# Patient Record
Sex: Female | Born: 1955
Health system: Southern US, Community
[De-identification: ages and names within clinical notes are randomized; demographics above are authoritative.]

## PROBLEM LIST (undated history)

## (undated) DIAGNOSIS — T8859XA Other complications of anesthesia, initial encounter: Secondary | ICD-10-CM

## (undated) DIAGNOSIS — H269 Unspecified cataract: Secondary | ICD-10-CM

## (undated) DIAGNOSIS — B019 Varicella without complication: Secondary | ICD-10-CM

## (undated) DIAGNOSIS — T7840XA Allergy, unspecified, initial encounter: Secondary | ICD-10-CM

## (undated) DIAGNOSIS — Z9889 Other specified postprocedural states: Secondary | ICD-10-CM

## (undated) DIAGNOSIS — E7800 Pure hypercholesterolemia, unspecified: Secondary | ICD-10-CM

## (undated) DIAGNOSIS — R32 Unspecified urinary incontinence: Secondary | ICD-10-CM

## (undated) DIAGNOSIS — I639 Cerebral infarction, unspecified: Secondary | ICD-10-CM

## (undated) DIAGNOSIS — M1990 Unspecified osteoarthritis, unspecified site: Secondary | ICD-10-CM

## (undated) DIAGNOSIS — T4145XA Adverse effect of unspecified anesthetic, initial encounter: Secondary | ICD-10-CM

## (undated) DIAGNOSIS — R112 Nausea with vomiting, unspecified: Secondary | ICD-10-CM

## (undated) HISTORY — DX: Unspecified cataract: H26.9

## (undated) HISTORY — PX: WISDOM TOOTH EXTRACTION: SHX21

## (undated) HISTORY — DX: Varicella without complication: B01.9

## (undated) HISTORY — DX: Unspecified urinary incontinence: R32

## (undated) HISTORY — DX: Pure hypercholesterolemia, unspecified: E78.00

## (undated) HISTORY — PX: EYE SURGERY: SHX253

## (undated) HISTORY — DX: Allergy, unspecified, initial encounter: T78.40XA

## (undated) HISTORY — PX: BLADDER SUSPENSION: SHX72

## (undated) HISTORY — PX: JOINT REPLACEMENT: SHX530

---
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
## 2006-02-28 LAB — HM MAMMOGRAPHY: HM Mammogram: NORMAL

## 2007-02-28 HISTORY — PX: HERNIA REPAIR: SHX51

## 2008-03-02 LAB — HM COLONOSCOPY: HM Colonoscopy: NORMAL

## 2012-11-04 ENCOUNTER — Ambulatory Visit: Payer: Self-pay | Admitting: Family

## 2012-11-18 ENCOUNTER — Encounter: Payer: Self-pay | Admitting: Family

## 2012-11-18 ENCOUNTER — Other Ambulatory Visit: Payer: Self-pay | Admitting: Family

## 2012-11-18 ENCOUNTER — Ambulatory Visit (INDEPENDENT_AMBULATORY_CARE_PROVIDER_SITE_OTHER): Payer: 59 | Admitting: Family

## 2012-11-18 VITALS — BP 130/66 | HR 62 | Temp 98.0°F | Ht 66.0 in | Wt 149.2 lb

## 2012-11-18 DIAGNOSIS — Z0000 Encounter for general adult medical examination without abnormal findings: Secondary | ICD-10-CM | POA: Insufficient documentation

## 2012-11-18 DIAGNOSIS — Z136 Encounter for screening for cardiovascular disorders: Secondary | ICD-10-CM

## 2012-11-18 DIAGNOSIS — Z23 Encounter for immunization: Secondary | ICD-10-CM

## 2012-11-18 DIAGNOSIS — Z1231 Encounter for screening mammogram for malignant neoplasm of breast: Secondary | ICD-10-CM

## 2012-11-18 LAB — HEPATIC FUNCTION PANEL
ALT: 20 U/L (ref 0–35)
Albumin: 4.8 g/dL (ref 3.5–5.2)
Alkaline Phosphatase: 41 U/L (ref 39–117)
Bilirubin, Direct: 0.1 mg/dL (ref 0.0–0.3)
Indirect Bilirubin: 0.4 mg/dL (ref 0.0–0.9)
Total Bilirubin: 0.5 mg/dL (ref 0.3–1.2)
Total Protein: 7.5 g/dL (ref 6.0–8.3)

## 2012-11-18 LAB — CBC WITH DIFFERENTIAL/PLATELET
Basophils Relative: 0 % (ref 0–1)
Eosinophils Relative: 2 % (ref 0–5)
HCT: 39.5 % (ref 36.0–46.0)
Lymphocytes Relative: 42 % (ref 12–46)
Lymphs Abs: 2 10*3/uL (ref 0.7–4.0)
MCH: 28.5 pg (ref 26.0–34.0)
MCV: 83.3 fL (ref 78.0–100.0)
Monocytes Absolute: 0.4 10*3/uL (ref 0.1–1.0)
Neutro Abs: 2.4 10*3/uL (ref 1.7–7.7)
Platelets: 249 10*3/uL (ref 150–400)
RBC: 4.74 MIL/uL (ref 3.87–5.11)
WBC: 4.9 10*3/uL (ref 4.0–10.5)

## 2012-11-18 LAB — TSH: TSH: 1.037 u[IU]/mL (ref 0.350–4.500)

## 2012-11-18 LAB — BASIC METABOLIC PANEL WITH GFR
CO2: 31 mEq/L (ref 19–32)
Calcium: 10 mg/dL (ref 8.4–10.5)
Chloride: 99 mEq/L (ref 96–112)
GFR, Est Non African American: >89 mL/min
Sodium: 138 mEq/L (ref 135–145)

## 2012-11-18 LAB — LIPID PANEL
Cholesterol: 251 mg/dL — ABNORMAL HIGH (ref 0–200)
HDL: 57 mg/dL (ref >39)
LDL Cholesterol: 171 mg/dL — ABNORMAL HIGH (ref 0–99)
Total CHOL/HDL Ratio: 4.4 Ratio
Triglycerides: 114 mg/dL (ref <150)
VLDL: 23 mg/dL (ref 0–40)

## 2012-11-18 NOTE — Progress Notes (Signed)
Subjective:    Patient ID: Ariel [name], female    DOB: 1955-03-09, 57 y.o.   MRN: [id number]  HPI  Patient presents today for complete physical.  Immunizations: last tetanus <10 yrs ago.  Had flu shot today Diet:reports healthy diet Exercise: yoga, walking, treadmill, elliptical Colonoscopy: ?4-5 yrs ago.  [facility]- per pt she was told to follow up in 10 years.   Dexa: remote, due Pap Smear: January of this year.  Mammogram: Due      Review of Systems  Constitutional: Negative for unexpected weight change.  HENT: Negative for congestion.   Eyes: Negative for visual disturbance.  Respiratory: Negative for cough and shortness of breath.   Cardiovascular: Negative for chest pain.  Gastrointestinal: Positive for constipation. Negative for nausea and vomiting.  Genitourinary: Negative for dysuria.  Musculoskeletal: Negative for myalgias and arthralgias.  Skin: Negative for rash.  Neurological: Negative for headaches.  Hematological: Negative for adenopathy.  Psychiatric/Behavioral:       Denies depression/anxiety   Past Medical History  Diagnosis Date  . Chicken pox   . High cholesterol   . Urinary incontinence     History   Social History  . Marital Status: Married    Spouse Name: N/A    Number of Children: N/A  . Years of Education: N/A   Occupational History  . Not on file.   Social History Main Topics  . Smoking status: Never Smoker   . Smokeless tobacco: Not on file  . Alcohol Use: No  . Drug Use: Not on file  . Sexual Activity: Not on file   Other Topics Concern  . Not on file   Social History Narrative   Lives with husband   One Son- lives locally, 1 grand-daughter   Daughter- Engineer, water. [place]   Completed technical school   Works as a Social worker.          Past Surgical History  Procedure Laterality Date  . Appendectomy  1962  . Hernia repair  2009  . Bladder suspension      No family history on file.  No Known  Allergies  No current outpatient prescriptions on file prior to visit.   No current facility-administered medications on file prior to visit.    BP 130/66  Pulse 62  Temp(Src) 98 F (36.7 C) (Oral)  Ht 5\' 6"  (1.676 m)  Wt 149 lb 4 oz (67.699 kg)  BMI 24.1 kg/m2  SpO2 98%       Objective:   Physical Exam  Physical Exam  Constitutional: She is oriented to person, place, and time. She appears well-developed and well-nourished. No distress.  HENT:  Head: Normocephalic and atraumatic.  Right Ear: Tympanic membrane and ear canal normal.  Left Ear: Tympanic membrane and ear canal normal.  Mouth/Throat: Oropharynx is clear and moist.  Eyes: Pupils are equal, round, and reactive to light. No scleral icterus.  Neck: Normal range of motion. No thyromegaly present.  Cardiovascular: Normal rate and regular rhythm.   No murmur heard. Pulmonary/Chest: Effort normal and breath sounds normal. No respiratory distress. He has no wheezes. She has no rales. She exhibits no tenderness.  Abdominal: Soft. Bowel sounds are normal. He exhibits no distension and no mass. There is no tenderness. There is no rebound and no guarding.  Musculoskeletal: She exhibits no edema.  Lymphadenopathy:    She has no cervical adenopathy.  Neurological: She is alert and oriented to person, place, and time. She has normal reflexes. She  exhibits normal muscle tone. Coordination normal.  Skin: Skin is warm and dry.  Psychiatric: She has a normal mood and affect. Her behavior is normal. Judgment and thought content normal.  Breast/pelvic: deferred to GYN.        Assessment & Plan:          Assessment & Plan:

## 2012-11-18 NOTE — Assessment & Plan Note (Signed)
Continue healthy diet, exercise. Obtain dexa, mammogram, fasting lab work.  Flu shot today.

## 2012-11-18 NOTE — Patient Instructions (Addendum)
Please complete lab work prior to leaving. Schedule bone density at the front desk. Schedule mammogram on the first floor. Continue healthy diet, exercise. Follow up in 1 year sooner if problems/concerns.

## 2012-11-19 ENCOUNTER — Encounter: Payer: Self-pay | Admitting: Family

## 2012-11-19 DIAGNOSIS — E785 Hyperlipidemia, unspecified: Secondary | ICD-10-CM | POA: Insufficient documentation

## 2012-11-19 LAB — URINALYSIS, ROUTINE W REFLEX MICROSCOPIC
Bilirubin Urine: NEGATIVE
Hgb urine dipstick: NEGATIVE
Ketones, ur: NEGATIVE mg/dL
Leukocytes, UA: NEGATIVE
Nitrite: NEGATIVE
Protein, ur: NEGATIVE mg/dL
Urobilinogen, UA: 0.2 mg/dL (ref 0.0–1.0)
pH: 6.5 (ref 5.0–8.0)

## 2012-11-20 ENCOUNTER — Telehealth: Payer: Self-pay | Admitting: *Deleted

## 2012-11-20 ENCOUNTER — Ambulatory Visit (HOSPITAL_BASED_OUTPATIENT_CLINIC_OR_DEPARTMENT_OTHER)
Admission: RE | Admit: 2012-11-20 | Discharge: 2012-11-20 | Disposition: A | Discharge status: Home / Self Care | Payer: 59 | Source: Ambulatory Visit | Attending: Family | Admitting: Family

## 2012-11-20 DIAGNOSIS — E785 Hyperlipidemia, unspecified: Secondary | ICD-10-CM

## 2012-11-20 DIAGNOSIS — Z1231 Encounter for screening mammogram for malignant neoplasm of breast: Secondary | ICD-10-CM

## 2012-11-20 NOTE — Telephone Encounter (Signed)
Message copied by Kathi Simpers on Wed Nov 20, 2012  5:19 PM ------      Message from: O'SULLIVAN, MELISSA      Created: Tue Nov 19, 2012  5:13 PM       Please contact pt and let her know cholesterol is elevated.  I would recommend that she focus on low fat/low cholesterol diet. Repeat FLP in 6 months dx hyperlipidemia.        Kidney function, sugar, liver, urine, thyroid and blood count all look good. ------

## 2012-11-20 NOTE — Telephone Encounter (Signed)
Notified pt and she voice understanding. Lab order entered. 

## 2012-11-27 ENCOUNTER — Ambulatory Visit: Payer: Self-pay | Admitting: Family Medicine

## 2012-12-09 ENCOUNTER — Ambulatory Visit (INDEPENDENT_AMBULATORY_CARE_PROVIDER_SITE_OTHER)
Admission: RE | Admit: 2012-12-09 | Discharge: 2012-12-09 | Disposition: A | Discharge status: Home / Self Care | Payer: 59 | Source: Ambulatory Visit | Attending: Family | Admitting: Family

## 2012-12-09 DIAGNOSIS — Z0000 Encounter for general adult medical examination without abnormal findings: Secondary | ICD-10-CM

## 2012-12-16 ENCOUNTER — Encounter: Payer: Self-pay | Admitting: Family

## 2013-11-19 ENCOUNTER — Encounter: Payer: Self-pay | Admitting: Family

## 2013-11-19 ENCOUNTER — Ambulatory Visit (INDEPENDENT_AMBULATORY_CARE_PROVIDER_SITE_OTHER): Payer: 59 | Admitting: Family

## 2013-11-19 ENCOUNTER — Telehealth: Payer: Self-pay

## 2013-11-19 VITALS — BP 111/61 | HR 63 | Temp 97.8°F | Resp 14 | Ht 66.0 in | Wt 145.5 lb

## 2013-11-19 DIAGNOSIS — Z23 Encounter for immunization: Secondary | ICD-10-CM

## 2013-11-19 DIAGNOSIS — E0590 Thyrotoxicosis, unspecified without thyrotoxic crisis or storm: Secondary | ICD-10-CM

## 2013-11-19 DIAGNOSIS — E785 Hyperlipidemia, unspecified: Secondary | ICD-10-CM

## 2013-11-19 DIAGNOSIS — Z0000 Encounter for general adult medical examination without abnormal findings: Secondary | ICD-10-CM

## 2013-11-19 DIAGNOSIS — R32 Unspecified urinary incontinence: Secondary | ICD-10-CM | POA: Insufficient documentation

## 2013-11-19 LAB — URINALYSIS, ROUTINE W REFLEX MICROSCOPIC
Bilirubin Urine: NEGATIVE
HGB URINE DIPSTICK: NEGATIVE
Ketones, ur: NEGATIVE
Leukocytes, UA: NEGATIVE
NITRITE: NEGATIVE
RBC / HPF: NONE SEEN (ref 0–?)
Specific Gravity, Urine: 1.01 (ref 1.000–1.030)
TOTAL PROTEIN, URINE-UPE24: NEGATIVE
Urine Glucose: NEGATIVE
Urobilinogen, UA: 0.2 (ref 0.0–1.0)
WBC, UA: NONE SEEN (ref 0–?)
pH: 8 (ref 5.0–8.0)

## 2013-11-19 LAB — LIPID PANEL
CHOL/HDL RATIO: 4
Cholesterol: 192 mg/dL (ref 0–200)
HDL: 44.2 mg/dL (ref >39.00)
LDL Cholesterol: 130 mg/dL — ABNORMAL HIGH (ref 0–99)
NonHDL: 147.8
TRIGLYCERIDES: 91 mg/dL (ref 0.0–149.0)
VLDL: 18.2 mg/dL (ref 0.0–40.0)

## 2013-11-19 LAB — CBC WITH DIFFERENTIAL/PLATELET
BASOS ABS: 0 10*3/uL (ref 0.0–0.1)
Basophils Relative: 0.4 % (ref 0.0–3.0)
EOS ABS: 0.1 10*3/uL (ref 0.0–0.7)
Eosinophils Relative: 1.3 % (ref 0.0–5.0)
HCT: 41.5 % (ref 36.0–46.0)
HEMOGLOBIN: 14 g/dL (ref 12.0–15.0)
LYMPHS PCT: 40.9 % (ref 12.0–46.0)
Lymphs Abs: 1.9 10*3/uL (ref 0.7–4.0)
MCHC: 33.7 g/dL (ref 30.0–36.0)
MCV: 86.2 fl (ref 78.0–100.0)
MONOS PCT: 7.1 % (ref 3.0–12.0)
Monocytes Absolute: 0.3 10*3/uL (ref 0.1–1.0)
NEUTROS ABS: 2.3 10*3/uL (ref 1.4–7.7)
NEUTROS PCT: 50.3 % (ref 43.0–77.0)
Platelets: 228 10*3/uL (ref 150.0–400.0)
RBC: 4.81 Mil/uL (ref 3.87–5.11)
RDW: 12.4 % (ref 11.5–15.5)
WBC: 4.6 10*3/uL (ref 4.0–10.5)

## 2013-11-19 LAB — BASIC METABOLIC PANEL
BUN: 12 mg/dL (ref 6–23)
CHLORIDE: 101 meq/L (ref 96–112)
CO2: 32 meq/L (ref 19–32)
Calcium: 9.6 mg/dL (ref 8.4–10.5)
Creatinine, Ser: 0.7 mg/dL (ref 0.4–1.2)
GFR: 97.23 mL/min (ref >60.00)
Glucose, Bld: 93 mg/dL (ref 70–99)
POTASSIUM: 4 meq/L (ref 3.5–5.1)
Sodium: 138 mEq/L (ref 135–145)

## 2013-11-19 LAB — HEPATIC FUNCTION PANEL
ALK PHOS: 45 U/L (ref 39–117)
ALT: 25 U/L (ref 0–35)
AST: 28 U/L (ref 0–37)
Albumin: 4.5 g/dL (ref 3.5–5.2)
BILIRUBIN DIRECT: 0 mg/dL (ref 0.0–0.3)
Total Bilirubin: 0.4 mg/dL (ref 0.2–1.2)
Total Protein: 7.9 g/dL (ref 6.0–8.3)

## 2013-11-19 LAB — TSH: TSH: 0.17 u[IU]/mL — ABNORMAL LOW (ref 0.35–4.50)

## 2013-11-19 NOTE — Assessment & Plan Note (Signed)
Will refer to urology at pt request. Check urinalysis.

## 2013-11-19 NOTE — Patient Instructions (Signed)
Please complete lab work prior to leaving. You will be contacted about your referral to Dr. Alinda Money. Please let me know if you have not heard back in 1 week about this referral. Schedule mammogram on the first floor. Follow up in 1 year, sooner if problems/concerns.

## 2013-11-19 NOTE — Progress Notes (Signed)
Subjective:    Patient ID: Ariel [name], female    DOB: 01-Dec-1955, 58 y.o.   MRN: [id number]  HPI  [name] is a 58 yr old female who presents today for cpx.    Immunizations: flu shot today, reports Tdap is due Diet: reports diet is healthy Exercise: elliptical 3x a week Colonoscopy: colonoscopy was performed 2010- ? Polyps- pt told 10 year follow up.  Dexa: 2013 Pap Smear:1/14 Mammogram: due  Urinary frequency/urgency- hx of bladder tack procedure.  Has declined rx therapy due to possible side effects. Would consider another bladder procedure if indicated. Would like to see Dr. Alinda Money at [facility] urology.   Hyperlipidemia-  Reports that she had shoulder pain on statin x 2 weeks.  Declines statin.    Hypotension- reports that she had one episode of hypotension 94/46, ? Dry that day.  No further low blood pressures.    BP Readings from Last 3 Encounters:  11/19/13 111/61  11/18/12 130/66       Review of Systems  Constitutional: Negative for unexpected weight change.  HENT: Negative for rhinorrhea.   Respiratory: Negative for cough and shortness of breath.   Cardiovascular: Negative for chest pain and leg swelling.  Gastrointestinal: Positive for constipation. Negative for nausea, vomiting and diarrhea.       Reports chronic constipation- takes "natural supplement" lots of fiber  Genitourinary: Positive for frequency. Negative for dysuria.  Musculoskeletal: Negative for arthralgias.  Skin: Negative for rash.       Hair loss, dry skin  Neurological: Negative for headaches.  Hematological: Negative for adenopathy.  Psychiatric/Behavioral:       Denies depression/anxiety   Past Medical History  Diagnosis Date  . Chicken pox   . High cholesterol   . Urinary incontinence     History   Social History  . Marital Status: Married    Spouse Name: N/A    Number of Children: N/A  . Years of Education: N/A   Occupational History  . Not on file.   Social History  Main Topics  . Smoking status: Never Smoker   . Smokeless tobacco: Not on file  . Alcohol Use: No  . Drug Use: Not on file  . Sexual Activity: Not on file   Other Topics Concern  . Not on file   Social History Narrative   Lives with husband   One Son- lives locally, 1 grand-daughter   Daughter- Sport and exercise psychologist. [place]   Completed technical school   Works as a Probation officer.          Past Surgical History  Procedure Laterality Date  . Appendectomy  1962  . Hernia repair  2009  . Bladder suspension      Family History  Problem Relation Age of Onset  . Hyperlipidemia      died 47- "natural causes"  . Coronary artery disease Father     died at 59  . COPD Father   . Alcohol abuse Father   . Hyperlipidemia      4 siblings   . Hypertension      3 siblings  . Diabetes Mellitus II Father     Allergies  Allergen Reactions  . Codeine Nausea Only    No current outpatient prescriptions on file prior to visit.   No current facility-administered medications on file prior to visit.    BP 111/61  Pulse 63  Temp(Src) 97.8 F (36.6 C) (Oral)  Resp 14  Ht 5\' 6"  (1.676 m)  Wt 145 lb 8 oz (65.998 kg)  BMI 23.50 kg/m2  SpO2 100%       Objective:   Physical Exam Physical Exam  Constitutional: She is oriented to person, place, and time. She appears well-developed and well-nourished. No distress.  HENT:  Head: Normocephalic and atraumatic.  Right Ear: Tympanic membrane and ear canal normal.  Left Ear: Tympanic membrane and ear canal normal.  Mouth/Throat: Oropharynx is clear and moist.  Eyes: Pupils are equal, round, and reactive to light. No scleral icterus.  Neck: Normal range of motion. No thyromegaly present.  Cardiovascular: Normal rate and regular rhythm.   No murmur heard. Pulmonary/Chest: Effort normal and breath sounds normal. No respiratory distress. He has no wheezes. She has no rales. She exhibits no tenderness.  Abdominal: Soft. Bowel sounds are normal. He  exhibits no distension and no mass. There is no tenderness. There is no rebound and no guarding.  Musculoskeletal: She exhibits no edema.  Lymphadenopathy:    She has no cervical adenopathy.  Neurological: She is alert and oriented to person, place, and time. She has normal patellar reflexes. She exhibits normal muscle tone. Coordination normal.  Skin: Skin is warm and dry.  Psychiatric: She has a normal mood and affect. Her behavior is normal. Judgment and thought content normal.  Breasts: Examined lying Right: Without masses, retractions, discharge or axillary adenopathy.  Left: Without masses, retractions, discharge or axillary adenopathy.            Assessment & Plan:          Assessment & Plan:

## 2013-11-19 NOTE — Assessment & Plan Note (Addendum)
Lab Results  Component Value Date   CHOL 192 11/19/2013   HDL 44.20 11/19/2013   LDLCALC 130* 11/19/2013   TRIG 91.0 11/19/2013   CHOLHDL 4 11/19/2013   Lipids are significantly improved. Continue dietary changes.

## 2013-11-19 NOTE — Assessment & Plan Note (Signed)
Continue healthy diet, exercise.  Obtain fasting labs, refer for mammogram.  EKG today.

## 2013-11-19 NOTE — Telephone Encounter (Signed)
Carrie Usery Self [phone]  Jaquelynn return a call

## 2013-11-19 NOTE — Progress Notes (Signed)
Pre visit review using our clinic review tool, if applicable. No additional management support is needed unless otherwise documented below in the visit note/SLS  

## 2013-11-20 NOTE — Telephone Encounter (Signed)
Please let pt know that I reviewed her lab work and it is showing that her thyroid is overactive.  I would like for her to complete a thyroid ultrasound for further evaluation.  Also, could you please ask lab to add on free t3, free t4 dx hyperthyroid? Cholesterol is much better- was 251 now down to 192.  Keep up the good work with low cholesterol diet and exercise.

## 2013-11-21 ENCOUNTER — Ambulatory Visit: Payer: 59

## 2013-11-21 DIAGNOSIS — E0590 Thyrotoxicosis, unspecified without thyrotoxic crisis or storm: Secondary | ICD-10-CM

## 2013-11-21 LAB — T4, FREE: Free T4: 0.92 ng/dL (ref 0.60–1.60)

## 2013-11-21 LAB — T3, FREE: T3 FREE: 3.3 pg/mL (ref 2.3–4.2)

## 2013-11-21 NOTE — Telephone Encounter (Signed)
Add on form faxed to lab. Detailed message left on cell voicemail and to call if any questions. Are you going to place u/s order?

## 2013-11-21 NOTE — Telephone Encounter (Signed)
US order has been placed.

## 2013-11-24 ENCOUNTER — Telehealth: Payer: Self-pay | Admitting: Family

## 2013-11-24 ENCOUNTER — Ambulatory Visit (HOSPITAL_BASED_OUTPATIENT_CLINIC_OR_DEPARTMENT_OTHER)
Admission: RE | Admit: 2013-11-24 | Discharge: 2013-11-24 | Disposition: A | Discharge status: Home / Self Care | Payer: 59 | Source: Ambulatory Visit | Attending: Interventional Radiology | Admitting: Interventional Radiology

## 2013-11-24 DIAGNOSIS — E042 Nontoxic multinodular goiter: Secondary | ICD-10-CM | POA: Insufficient documentation

## 2013-11-24 DIAGNOSIS — E0590 Thyrotoxicosis, unspecified without thyrotoxic crisis or storm: Secondary | ICD-10-CM | POA: Insufficient documentation

## 2013-11-24 IMAGING — US US SOFT TISSUE HEAD/NECK
1 series · 14 of 25 positions shown · non-contrast
Comparison: None.

CLINICAL DATA: 58 yr old female with hyperthyroid. Please evaluate.

EXAM:
THYROID ULTRASOUND
TECHNIQUE: Ultrasound examination of the thyroid gland and adjacent soft
tissues was performed.

[Series 1: us soft tissue head/neck · 0.05mm/px · 14 of 49 slices shown]
[im 1/49]
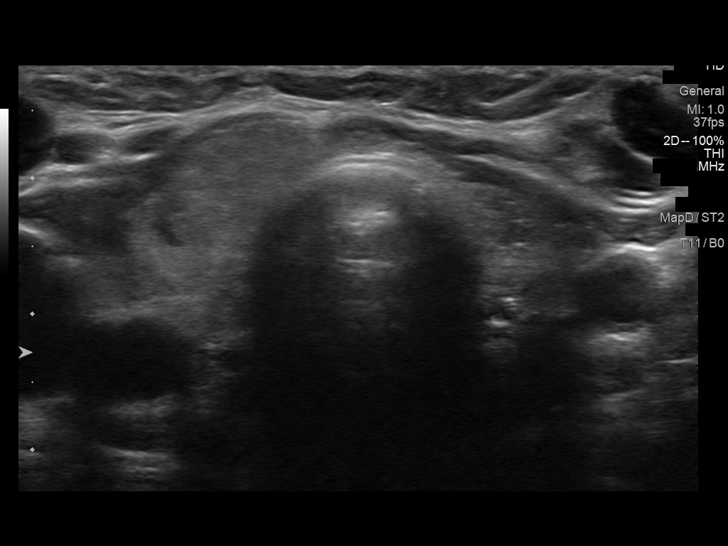
[im 5/49]
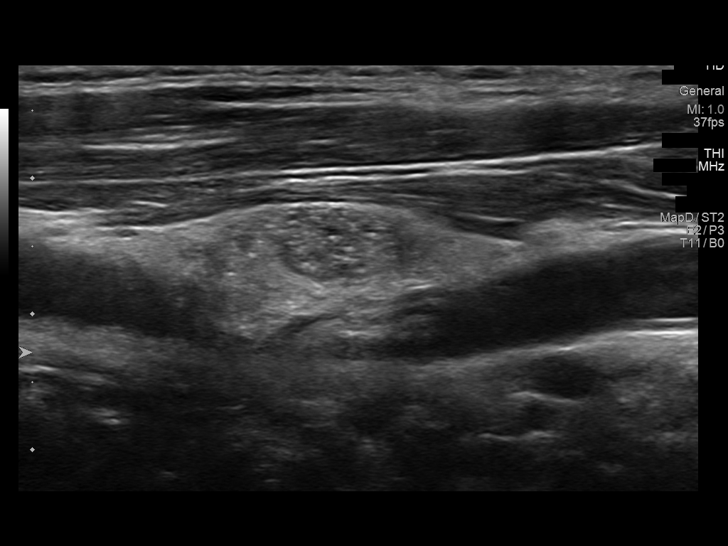
[im 9/49]
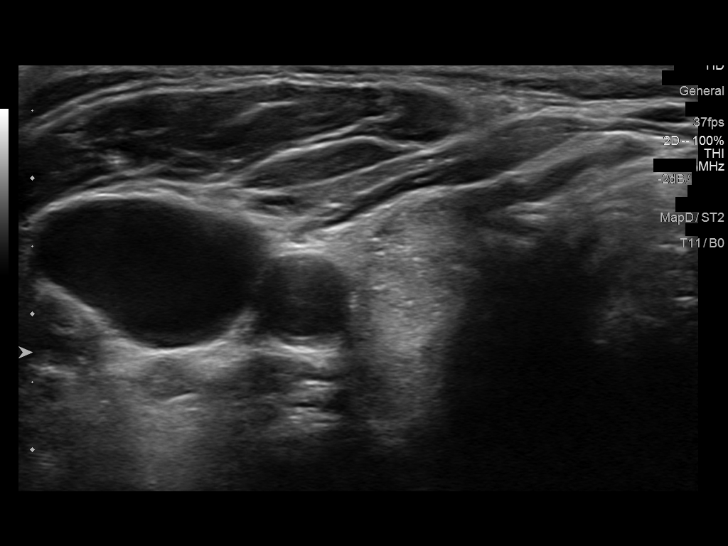
[im 13/49]
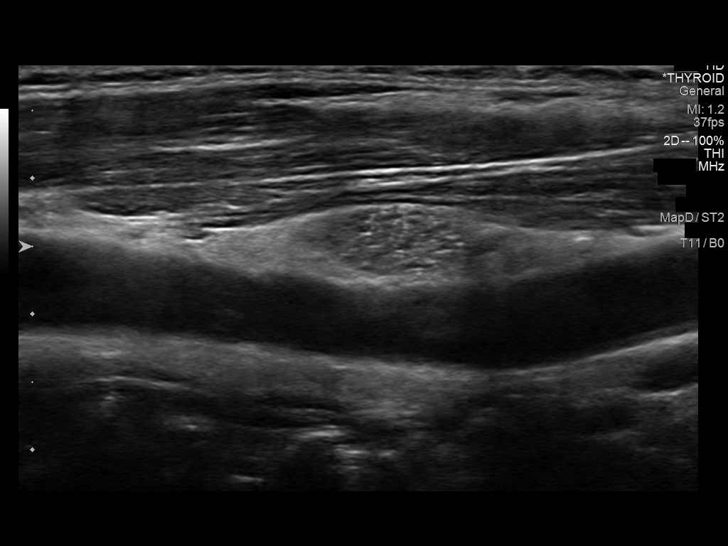
[im 17/49]
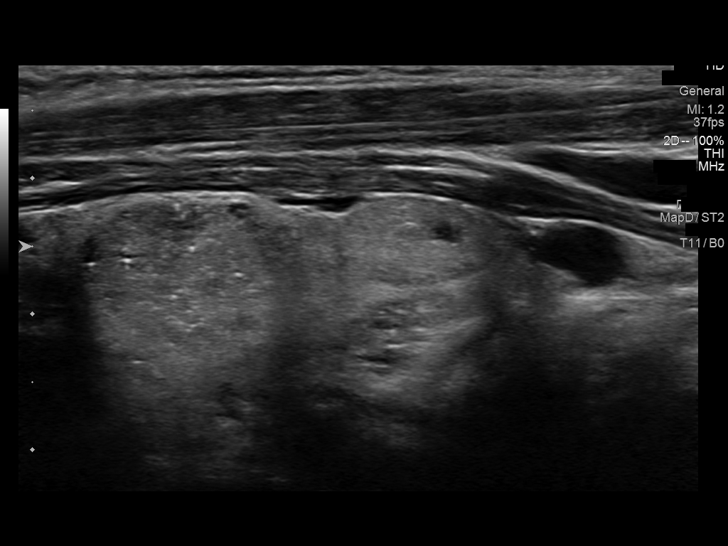
[im 19/49]
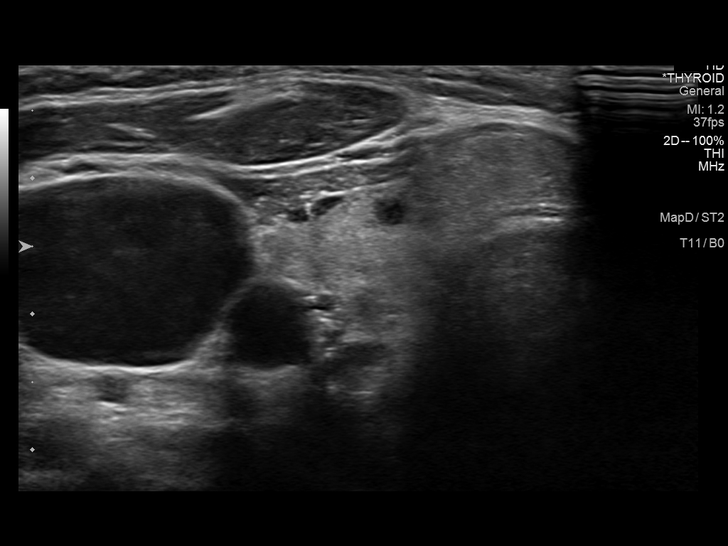
[im 23/49]
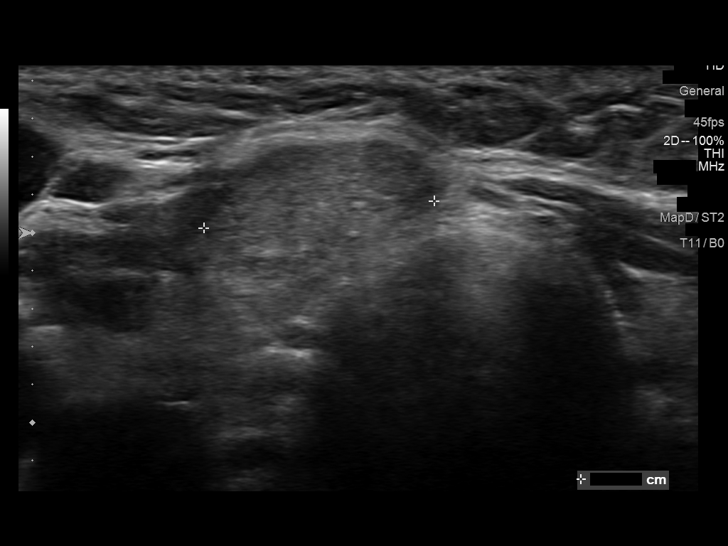
[im 27/49]
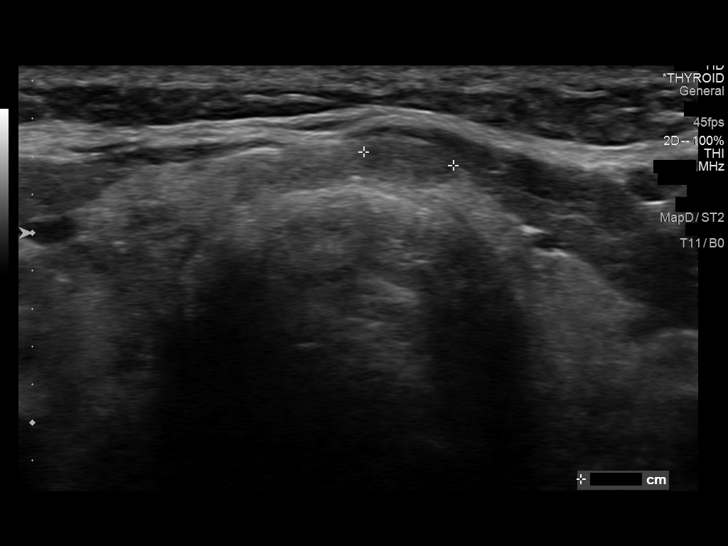
[im 31/49]
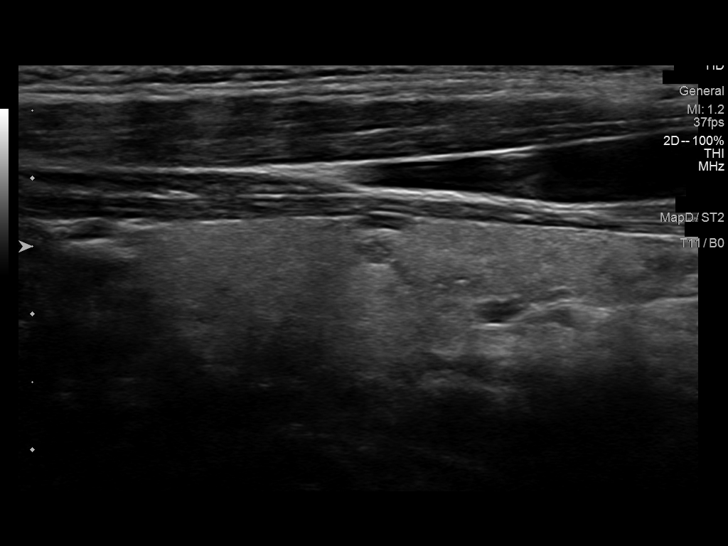
[im 33/49]
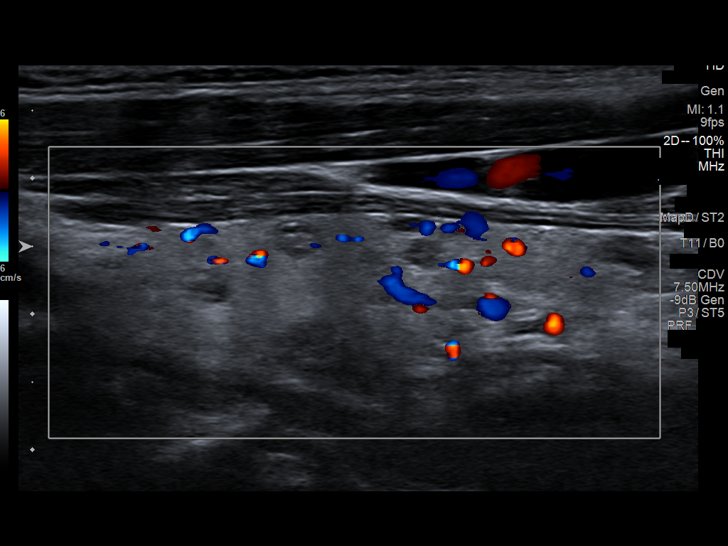
[im 37/49]
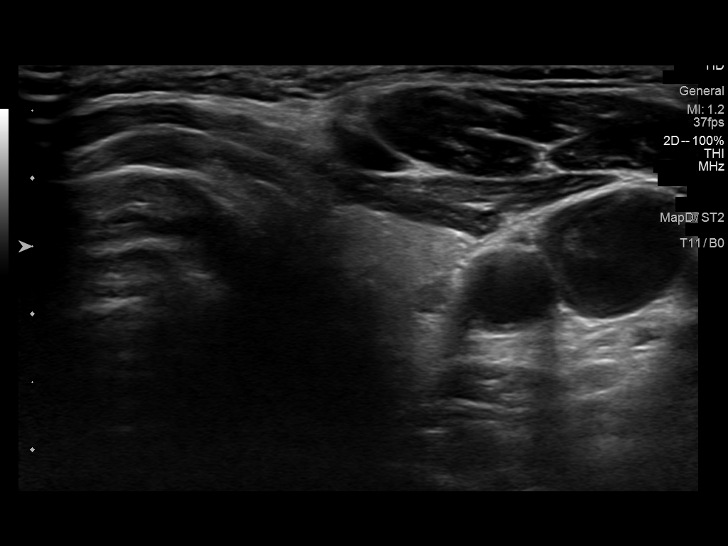
[im 41/49]
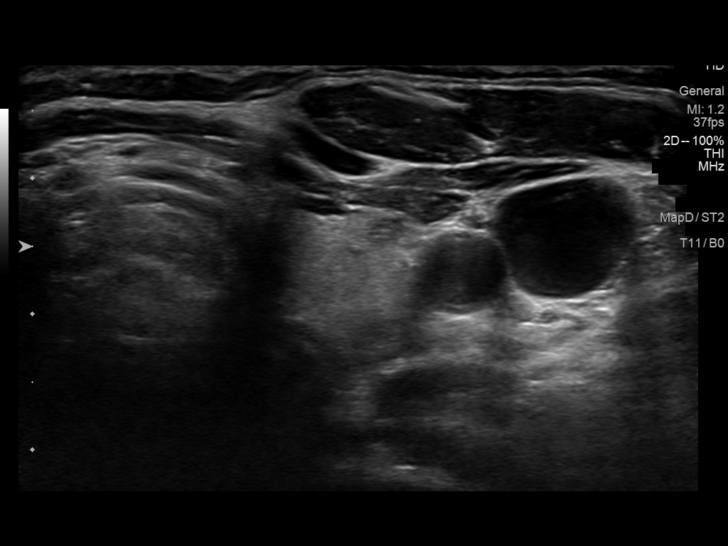
[im 45/49]
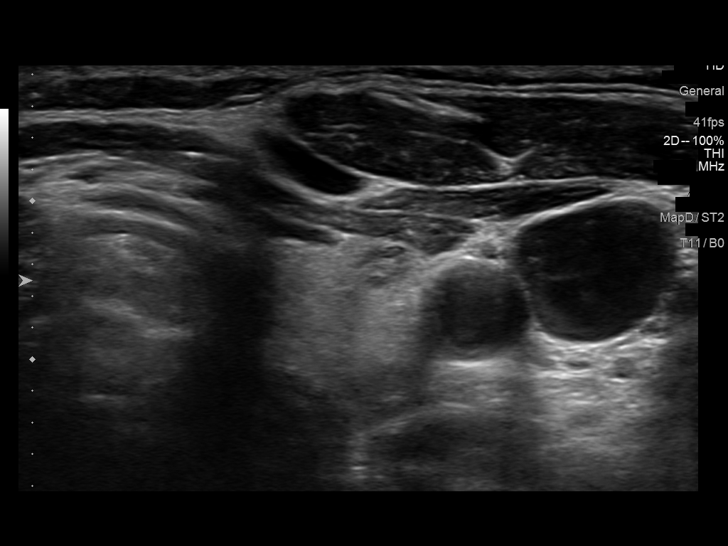
[im 49/49]
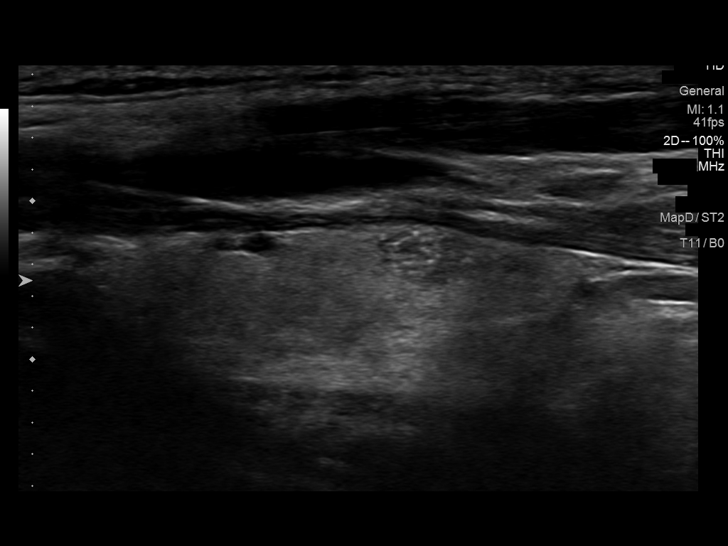

[14 of 25 positions shown; findings below may reference images not displayed]

FINDINGS: Right thyroid lobe

Measurements: 39 x 15 x 16 mm. Hyperemic with inhomogeneous
echotexture, multiple nodules. Largest 15 x 12 x 12 mm solid with
microcalcifications, superior pole. 10 x 8 x 5 mm solid, mid lobe 12
x 7 x 6 mm nearly isoechoic, inferior pole.

Left thyroid lobe

Measurements: 39 x 15 x 11 mm. Multiple nodules. Largest 6 x 5 x 4
mm solid, inferior pole. Remainder all 5 mm or less maximum
diameter.

Isthmus

Thickness: 2 mm. Dominant right-sided 15 x 12 x 9 mm solid nodule
with a few small microcalcifications. Smaller 5 x 2 mm nodule to the
left of midline.

Lymphadenopathy

None visualized.
IMPRESSION: 1. Normal-sized thyroid with multiple nodules. Dominant right and
isthmic lesions meet consensus criteria for biopsy.
Ultrasound-guided fine needle aspiration should be considered, as
per the consensus statement: Management of Thyroid Nodules Detected
at US: Society of Radiologists in Ultrasound Consensus Conference

## 2013-11-24 NOTE — Telephone Encounter (Signed)
Please contact pt and let her know that her thyroid ultrasound shows some thyroid nodules. I would like for her to meet with endocrinology for further evaluation.

## 2013-11-24 NOTE — Telephone Encounter (Signed)
Notified pt and she voices understanding. 

## 2013-12-04 ENCOUNTER — Telehealth: Payer: Self-pay | Admitting: Family

## 2013-12-04 ENCOUNTER — Encounter: Payer: Self-pay | Admitting: Family

## 2013-12-04 NOTE — Telephone Encounter (Signed)
Caller name: Rhen  Relation to pt: self  Call back number: [phone]   Reason for call: pt would like discuss upcoming endocrinologist appointment

## 2013-12-05 NOTE — Telephone Encounter (Signed)
Spoke with pt. Rescheduled her for appointment with Dr. Cruzita Lederer, pt aware.

## 2013-12-05 NOTE — Telephone Encounter (Signed)
Spoke with pt, she states appt has been taken care of. Nothing needed at this time.

## 2013-12-08 ENCOUNTER — Ambulatory Visit: Payer: 59 | Admitting: Endocrinology

## 2013-12-08 ENCOUNTER — Ambulatory Visit (HOSPITAL_BASED_OUTPATIENT_CLINIC_OR_DEPARTMENT_OTHER)
Admission: RE | Admit: 2013-12-08 | Discharge: 2013-12-08 | Disposition: A | Discharge status: Home / Self Care | Payer: 59 | Source: Ambulatory Visit | Attending: Family | Admitting: Family

## 2013-12-08 DIAGNOSIS — Z0000 Encounter for general adult medical examination without abnormal findings: Secondary | ICD-10-CM

## 2013-12-08 DIAGNOSIS — Z1231 Encounter for screening mammogram for malignant neoplasm of breast: Secondary | ICD-10-CM | POA: Diagnosis present

## 2013-12-08 IMAGING — MG MM DIGITAL SCREENING BILATERAL
4 series · 4 of 4 positions shown · non-contrast
Comparison: Previous exam(s).

CLINICAL DATA: Screening.

EXAM:
DIGITAL SCREENING BILATERAL MAMMOGRAM WITH CAD

[R CC]
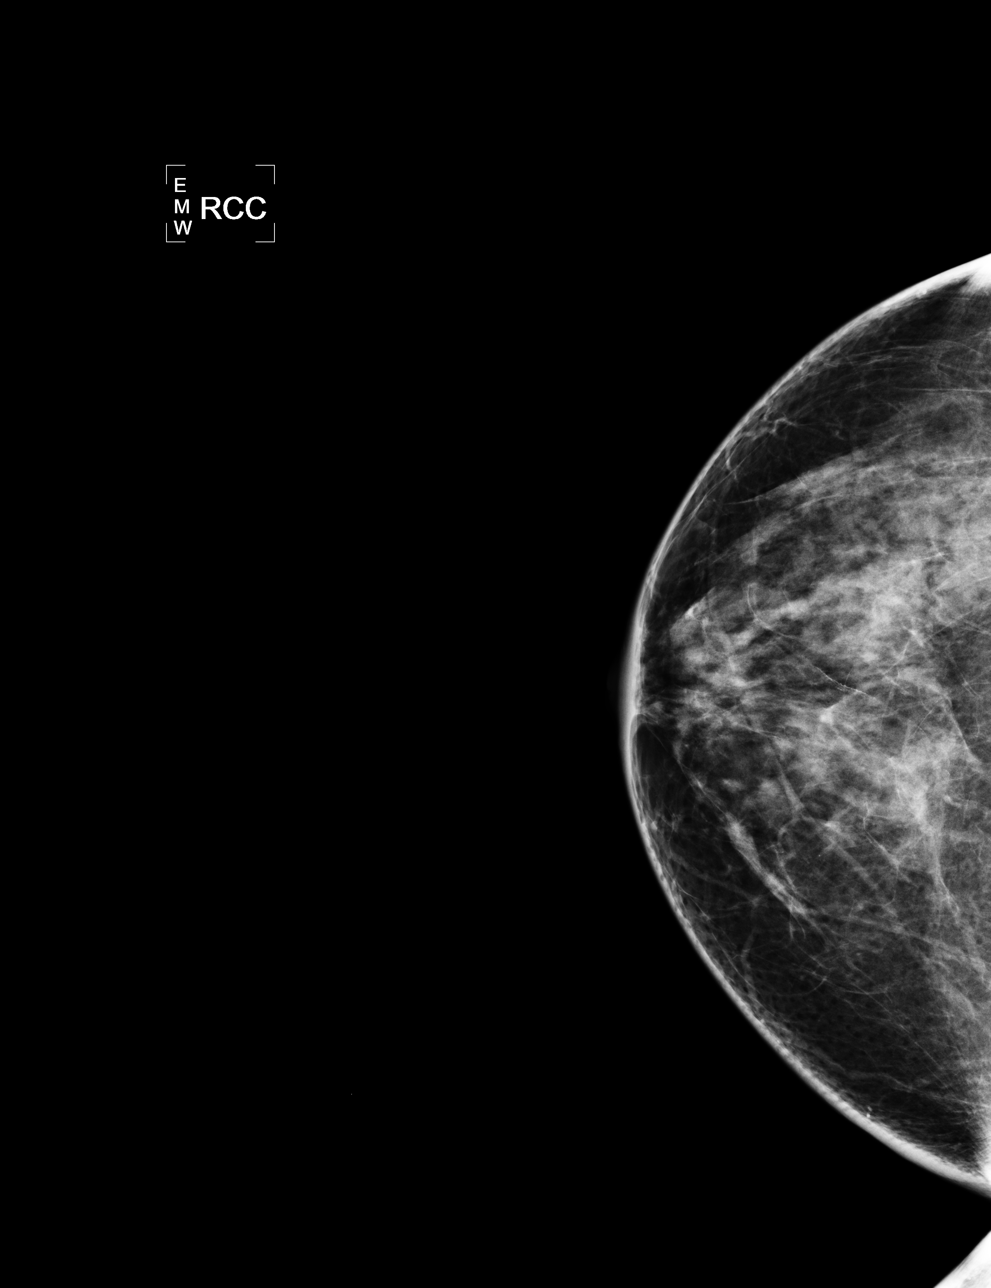

[L CC]
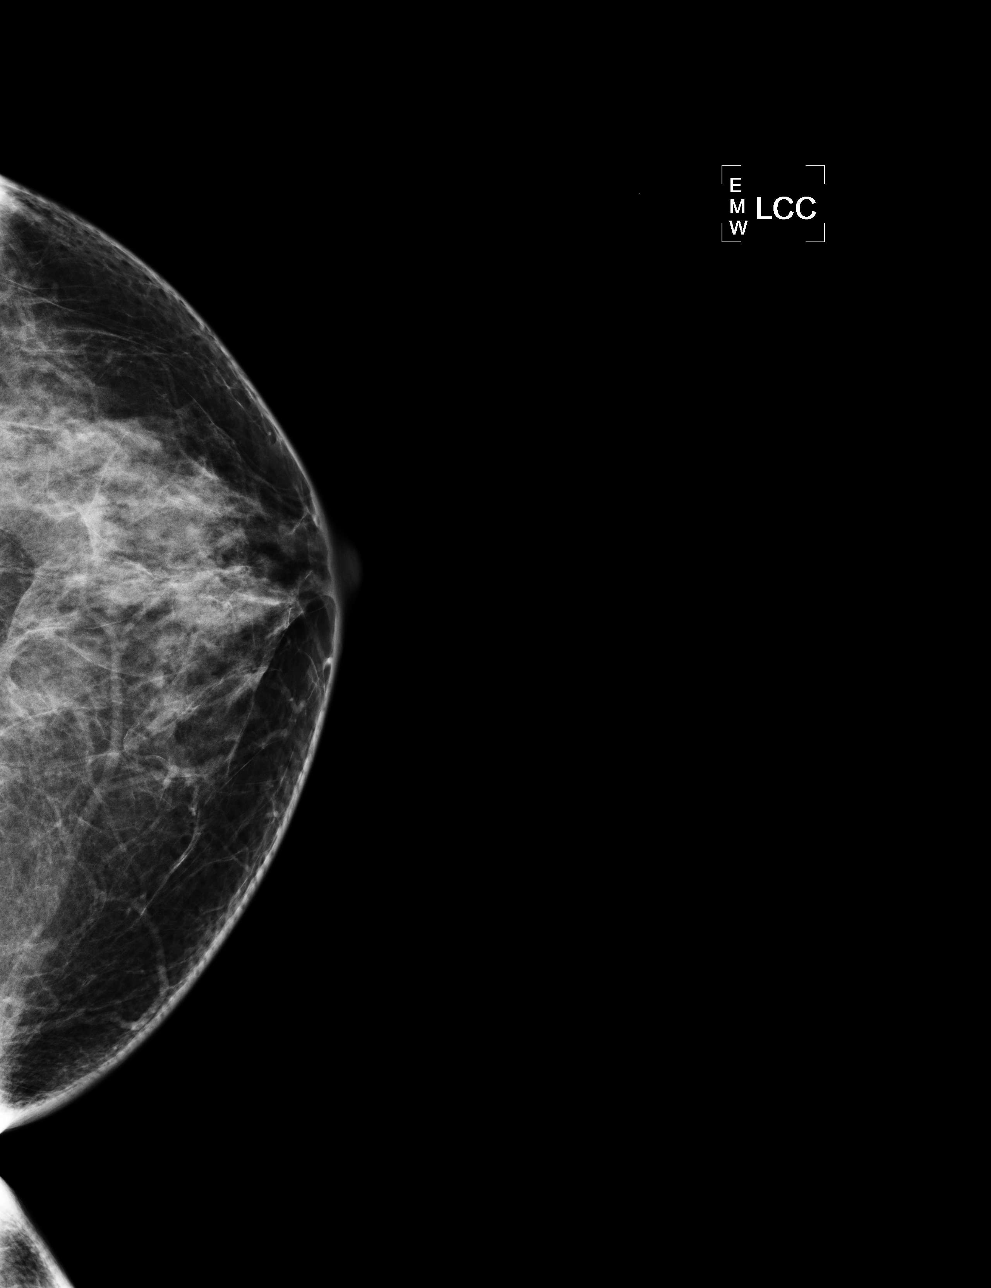

[L MLO]
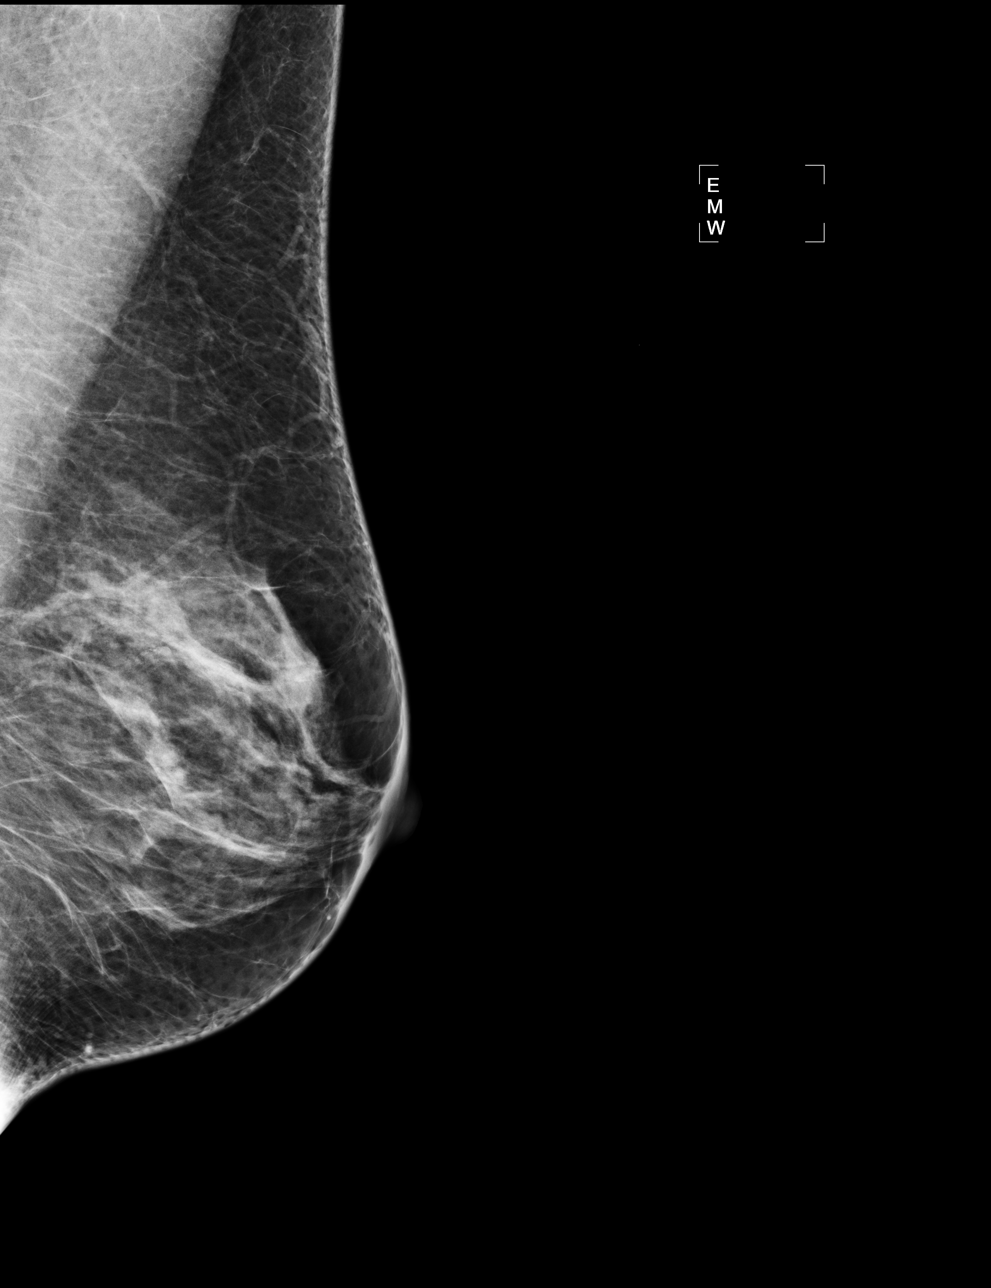

[R MLO]
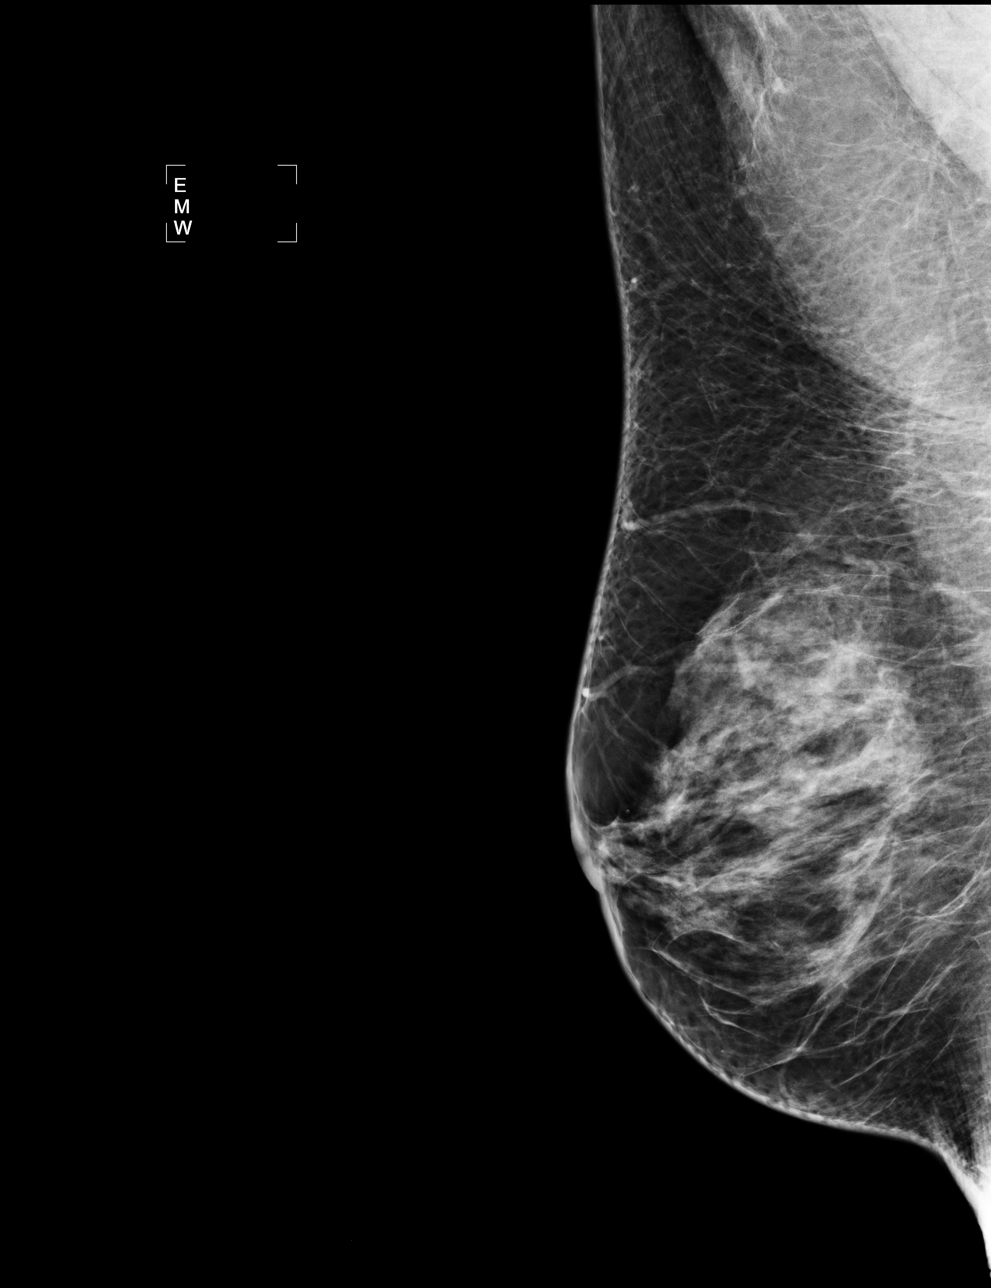

[4 of 4 positions shown; findings below may reference images not displayed]

ACR Breast Density Category c: The breast tissue is heterogeneously
dense, which may obscure small masses.
FINDINGS: There are no findings suspicious for malignancy. Images were
processed with CAD.
IMPRESSION: No mammographic evidence of malignancy. A result letter of this
screening mammogram will be mailed directly to the patient.

RECOMMENDATION:
Screening mammogram in one year. (Code:[0J])

BI-RADS CATEGORY  1: Negative.

## 2013-12-19 ENCOUNTER — Encounter: Payer: Self-pay | Admitting: Internal Medicine

## 2013-12-19 ENCOUNTER — Ambulatory Visit (INDEPENDENT_AMBULATORY_CARE_PROVIDER_SITE_OTHER): Payer: 59 | Admitting: Internal Medicine

## 2013-12-19 ENCOUNTER — Other Ambulatory Visit: Payer: Self-pay | Admitting: Internal Medicine

## 2013-12-19 VITALS — BP 102/60 | HR 71 | Temp 98.3°F | Resp 12 | Ht 66.0 in | Wt 146.0 lb

## 2013-12-19 DIAGNOSIS — E042 Nontoxic multinodular goiter: Secondary | ICD-10-CM

## 2013-12-19 DIAGNOSIS — R7989 Other specified abnormal findings of blood chemistry: Secondary | ICD-10-CM

## 2013-12-19 DIAGNOSIS — R946 Abnormal results of thyroid function studies: Secondary | ICD-10-CM

## 2013-12-19 DIAGNOSIS — E0590 Thyrotoxicosis, unspecified without thyrotoxic crisis or storm: Secondary | ICD-10-CM | POA: Insufficient documentation

## 2013-12-19 NOTE — Progress Notes (Addendum)
Patient ID: Ariel [name], female   DOB: 01-17-1956, 58 y.o.   MRN: [id number]   HPI  Ariel [name] is a 58 y.o.-year-old female, referred by her PCP, O'SULLIVAN,MELISSA S., NP, in consultation for a low TSH.  I reviewed pt's thyroid tests: Lab Results  Component Value Date   TSH 0.17* 11/19/2013   TSH 1.037 11/18/2012   FREET4 0.92 11/21/2013    Pt c/o: - + hair loss - + fatigue - starting ~ 10 mo ago, after last year has been very stressful for her - no excessive sweating/heat intolerance - no tremors - + more anxiety - + occasional palpitations - when angry; not with exercise - cardio 3x a week - problems with concentration - + constipation - all her life - no weight loss/gain  She denies feeling nodules in neck, hoarseness, dysphagia/odynophagia, SOB with lying down.  She did have a thyroid U/S (11/24/2013) - multiple nodules, of which 2 are larger:  Right thyroid lobe Measurements: 39 x 15 x 16 mm. Hyperemic with inhomogeneous echotexture, multiple nodules.  - Largest 15 x 12 x 12 mm solid with microcalcifications, superior pole.  - 10 x 8 x 5 mm solid, mid lobe  - 12 x 7 x 6 mm nearly isoechoic, inferior pole.   Left thyroid lobe Measurements: 39 x 15 x 11 mm. Multiple nodules.  - Largest 6 x 5 x 4 mm solid, inferior pole.  - Remainder all 5 mm or less maximum diameter.   Isthmus Thickness: 2 mm.  - Dominant right-sided 15 x 12 x 9 mm solid nodule with a few small microcalcifications.  - Smaller 5 x 2 mm nodule to the left of midline.   Lymphadenopathy: None visualized.   Pt does not have a FH of thyroid ds. No FH of thyroid cancer. No h/o radiation tx to head or neck.  No seaweed or kelp, no recent contrast studies. No steroid use. Herbal supplements - uses essential oils.  ROS: Constitutional:see history of present illness, + excessive urination Eyes: no blurry vision, no xerophthalmia ENT: no sore throat, no nodules palpated in throat, no dysphagia/odynophagia, no  hoarseness Cardiovascular: no CP/SOB/+ palpitations/no leg swelling Respiratory: no cough/SOB Gastrointestinal: no N/V/D/+ C Musculoskeletal: + muscle/no joint aches Skin: no rashes, + hair loss Neurological: no tremors/numbness/tingling/dizziness Psychiatric: no depression/anxiety  Past Medical History  Diagnosis Date  . Chicken pox   . High cholesterol   . Urinary incontinence    Past Surgical History  Procedure Laterality Date  . Appendectomy  1962  . Hernia repair  2009  . Bladder suspension     History   Social History  . Marital Status: Married   Social History Main Topics  . Smoking status: Never Smoker   . Smokeless tobacco: No  . Alcohol Use: No  . Drug Use: No   Social History Narrative   Lives with husband   One Son- lives locally, 1 grand-daughter   One Daughter- [place]   Completed Hotel manager school   Works as a Probation officer.   Current Outpatient Prescriptions on File Prior to Visit  Medication Sig Dispense Refill  . Multiple Vitamin (MULTIVITAMIN) tablet Take 1 tablet by mouth daily.       No current facility-administered medications on file prior to visit.   Allergies  Allergen Reactions  . Codeine Nausea Only   Family History  Problem Relation Age of Onset  . Hyperlipidemia      died 80- "natural causes"  . Coronary  artery disease Father     died at 56  . COPD Father   . Alcohol abuse Father   . Hyperlipidemia      4 siblings   . Hypertension      3 siblings  . Diabetes Mellitus II Father    PE: BP 102/60  Pulse 71  Temp(Src) 98.3 F (36.8 C) (Oral)  Resp 12  Ht 5\' 6"  (1.676 m)  Wt 146 lb (66.225 kg)  BMI 23.58 kg/m2  SpO2 97% Wt Readings from Last 3 Encounters:  12/19/13 146 lb (66.225 kg)  11/19/13 145 lb 8 oz (65.998 kg)  11/18/12 149 lb 4 oz (67.699 kg)   Constitutional: normal weight, in NAD Eyes: PERRLA, EOMI, no exophthalmos, no lid lag, no stare ENT: moist mucous membranes, no thyromegaly, + thyroid  nodule palpated in the isthmus and also prominent left thyroid lobe; no thyroid bruits, no cervical lymphadenopathy Cardiovascular: RRR, No MRG Respiratory: CTA B Gastrointestinal: abdomen soft, NT, ND, BS+ Musculoskeletal: no deformities, strength intact in all 4 Skin: moist, warm, no rashes Neurological: no tremor with outstretched hands, DTR normal in all 4  ASSESSMENT: 1. Low TSH - normal free t4  2. Multiple thyroid nodules  PLAN:  1. Patient with a recently found low TSH, without thyrotoxic sxs (she does have fatigue and hair loss). - she does not appear to have exogenous causes for the low TSH.  - We discussed that possible causes of thyrotoxicosis are:  Graves ds   Thyroiditis toxic multinodular goiter/ toxic adenoma (has thyroid nodules on U/S). - I suggested that we check the TSH, fT3 and fT4 and if the TSH still low >> check an Uptake and scan to differentiate between the 3 above possible etiologies  - we discussed about possible modalities of treatment for the above conditions, to include methimazole use, radioactive iodine ablation or surgery. - I do not feel that we need to add beta blockers at this time, since she is not tachycardic, anxious, or tremulous - RTC in 4 months, but likely sooner for repeat labs  2. Multiple thyroid nodules - patient with several thyroid nodules, largest in the right thyroid lobe and the isthmus, at 1.5 cm - we discussed about checking thyroid tests >>   If the TSH is normal >> I suggested a biopsy the 2 largest thyroid nodules   if the TSH is still low >> check a thyroid uptake and scan to see which one of the above nodules are overactive. His overactive nodules are unlikely to be cancerous, we would not need to biopsy those - she agrees with the plan - We discussed about possible outcomes of the biopsy, to include benign, malignant, or intermediate cytology - we also briefly reviewed thyroid cancer prognosis and treatment  Orders Only  on 12/19/2013  Component Date Value Ref Range Status  . TSH 12/19/2013 0.714  0.350 - 4.500 uIU/mL Final  . Free T4 12/19/2013 1.04  0.80 - 1.80 ng/dL Final  . T3, Free 12/19/2013 3.1  2.3 - 4.2 pg/mL Final   TFTs normal. Will suggest Bx of the 2x 1.5 cm nodules.  Adequacy Reason Satisfactory For Evaluation. Diagnosis THYROID, FINE NEEDLE ASPIRATION, ISTHMUS, RIGHT (SPECIMEN 2 OF 2, COLLECTED ON 12/24/13): BENIGN. FINDINGS CONSISTENT WITH NON-NEOPLASTIC GOITER. Mali RUND DO Pathologist, Electronic Signature (Case signed 12/25/2013) Specimen Clinical Information Hyperthyroid, 15 x 12 x 32mm dominant right sided isthmus solid nodule with microcalcifications thyroid biopsy  Adequacy Reason Satisfactory For Evaluation. Diagnosis THYROID, FINE NEEDLE  ASPIRATION, RUL (SPECIMEN 1 OF 2, COLLECTED ON 12/24/13): BENIGN. FINDINGS CONSISTENT WITH NON-NEOPLASTIC GOITER. Mali RUND DO Pathologist, Electronic Signature (Case signed 12/25/2013) Specimen Clinical Information Hyperthyroid, 15 x 12 x 37mm solid with microcalcifications right upper pole biopsy  Both nodules are benign! We can move the next appt out at  1 year.

## 2013-12-19 NOTE — Patient Instructions (Signed)
You have subclinical hyperthyroidism.  Please stop at [facility] lab downstairs.  Please come back for a follow-up appointment in 4 months.

## 2013-12-20 LAB — TSH: TSH: 0.714 u[IU]/mL (ref 0.350–4.500)

## 2013-12-20 LAB — T3, FREE: T3, Free: 3.1 pg/mL (ref 2.3–4.2)

## 2013-12-20 LAB — T4, FREE: Free T4: 1.04 ng/dL (ref 0.80–1.80)

## 2013-12-22 DIAGNOSIS — E042 Nontoxic multinodular goiter: Secondary | ICD-10-CM | POA: Insufficient documentation

## 2013-12-24 ENCOUNTER — Encounter: Payer: Self-pay | Admitting: Internal Medicine

## 2013-12-24 ENCOUNTER — Ambulatory Visit
Admission: RE | Admit: 2013-12-24 | Discharge: 2013-12-24 | Disposition: A | Discharge status: Home / Self Care | Payer: 59 | Source: Ambulatory Visit | Attending: Internal Medicine | Admitting: Internal Medicine

## 2013-12-24 ENCOUNTER — Other Ambulatory Visit (HOSPITAL_COMMUNITY)
Admission: RE | Admit: 2013-12-24 | Discharge: 2013-12-24 | Disposition: A | Discharge status: Home / Self Care | Payer: 59 | Source: Ambulatory Visit | Attending: Diagnostic Radiology | Admitting: Diagnostic Radiology

## 2013-12-24 DIAGNOSIS — E041 Nontoxic single thyroid nodule: Secondary | ICD-10-CM | POA: Insufficient documentation

## 2013-12-24 IMAGING — US US THYROID BIOPSY
1 series · 13 of 21 positions shown · non-contrast
Comparison: none

CLINICAL DATA: 58-year-old with thyroid nodules. Dominant nodules
in the superior right thyroid lobe and right side of the isthmus.

[Series 1: us thyroid biopsy · 0.06mm/px · 21 acquisitions, 13 frames shown]
[im 1/21]
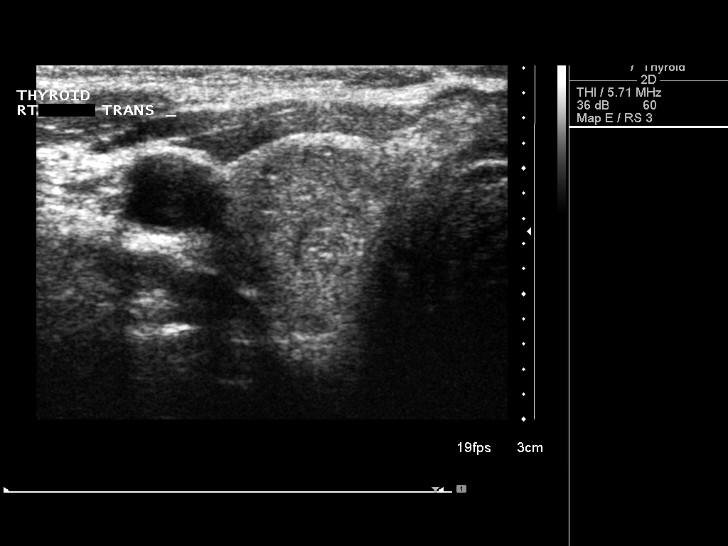
[im 3/21]
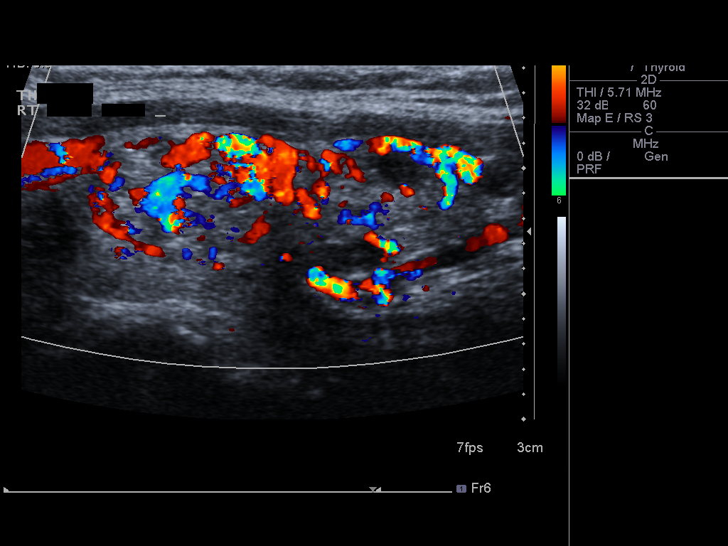
[im 5/21]
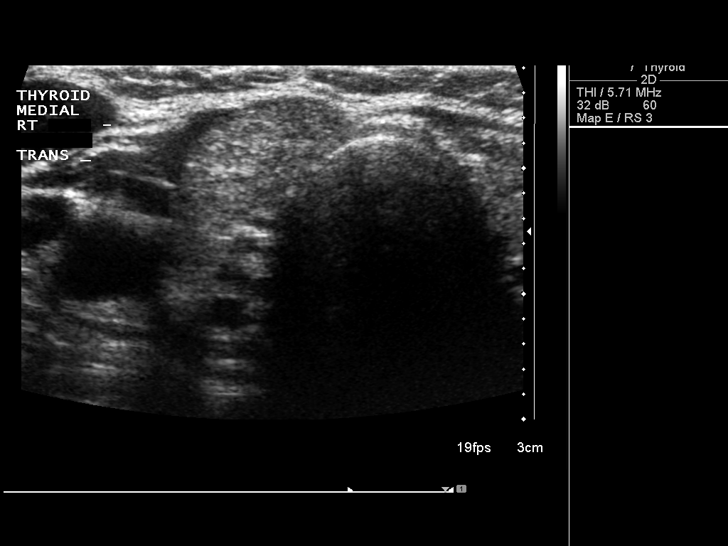
[im 6/21]
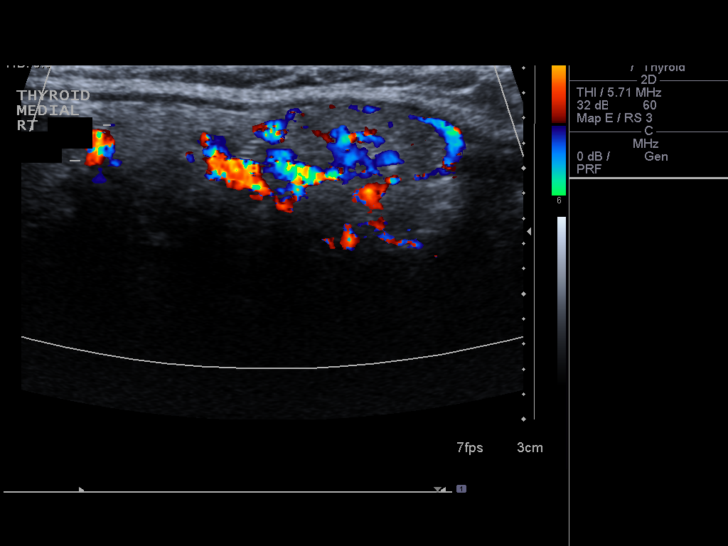
[im 8/21]
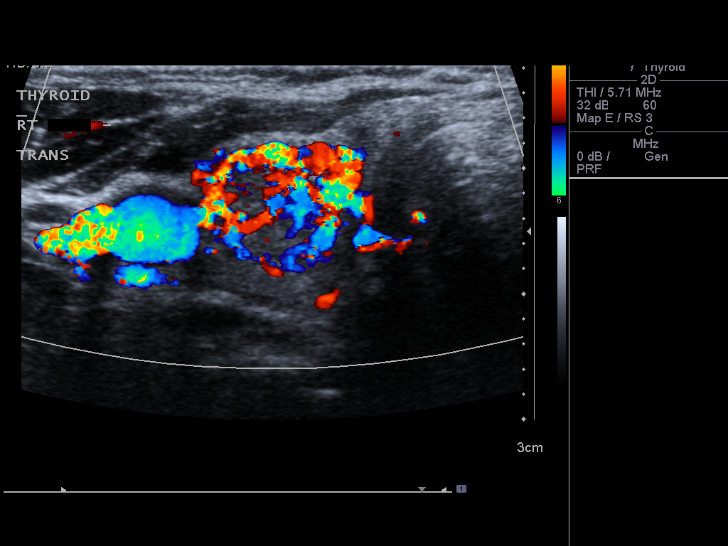
[im 9/21]
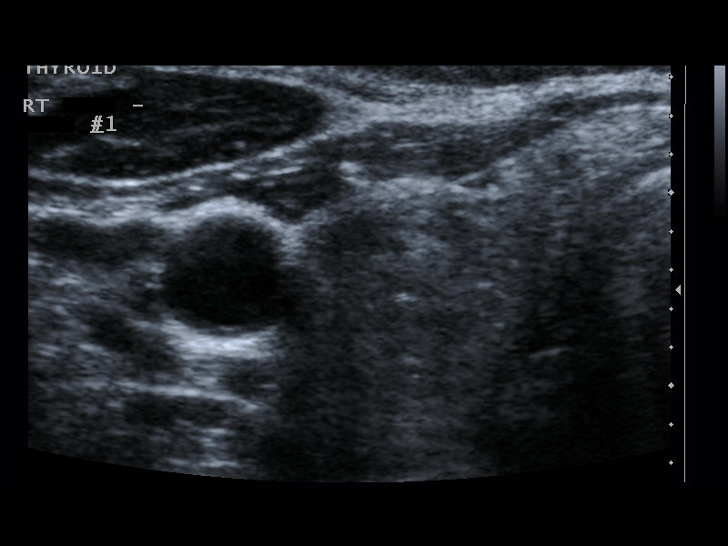
[im 11/21]
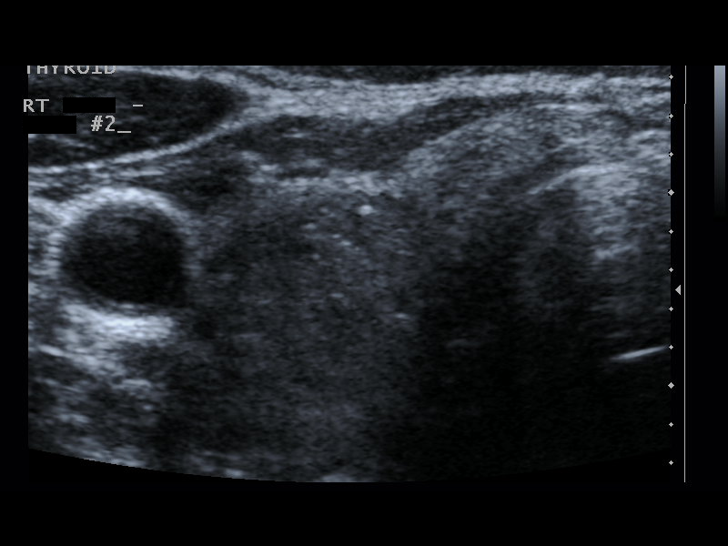
[im 13/21]
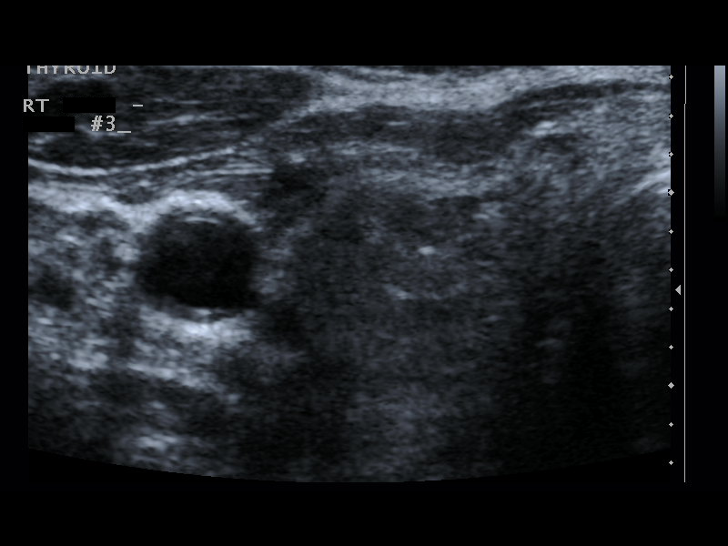
[im 14/21]
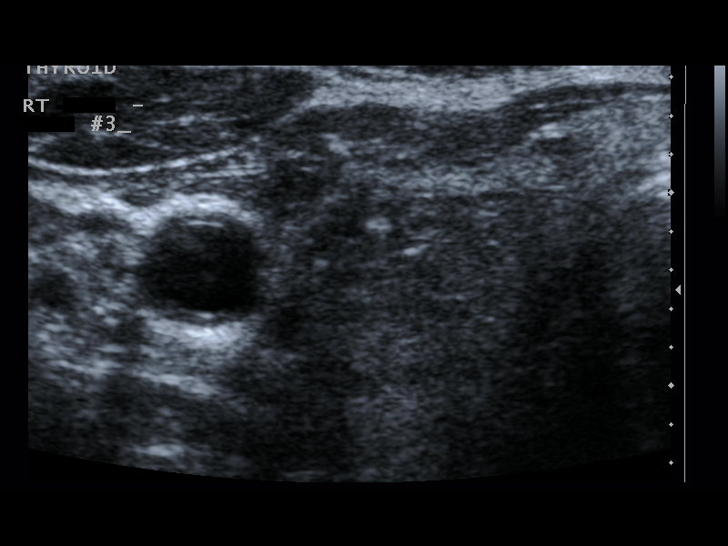
[im 16/21]
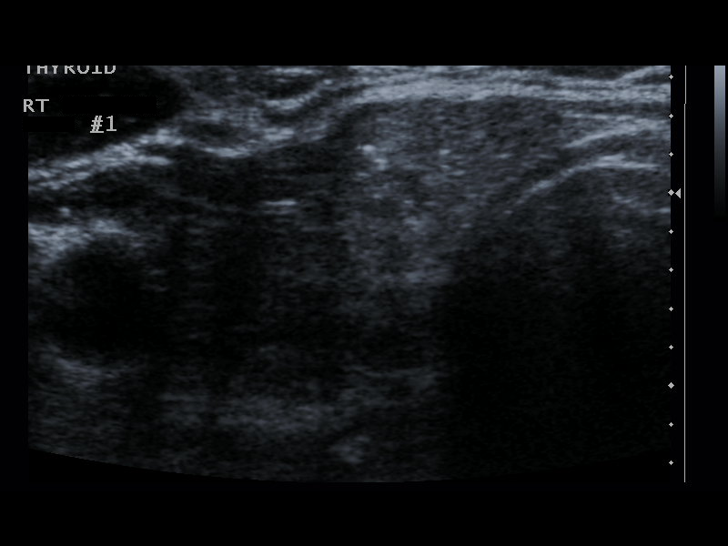
[im 17/21]
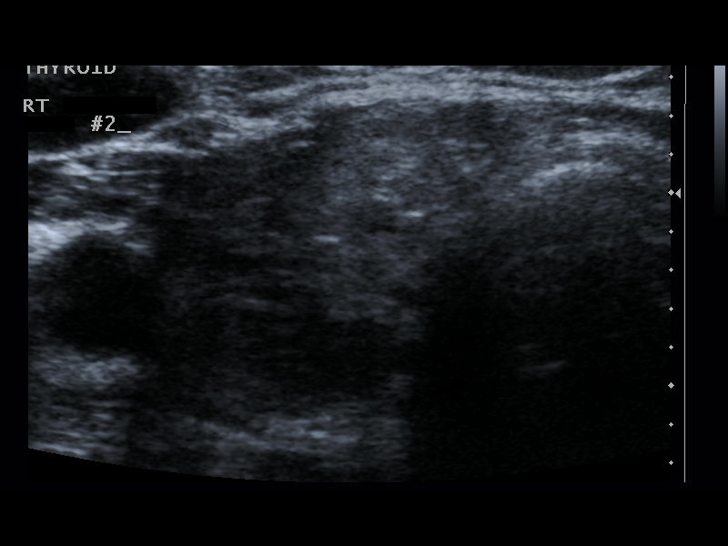
[im 19/21]
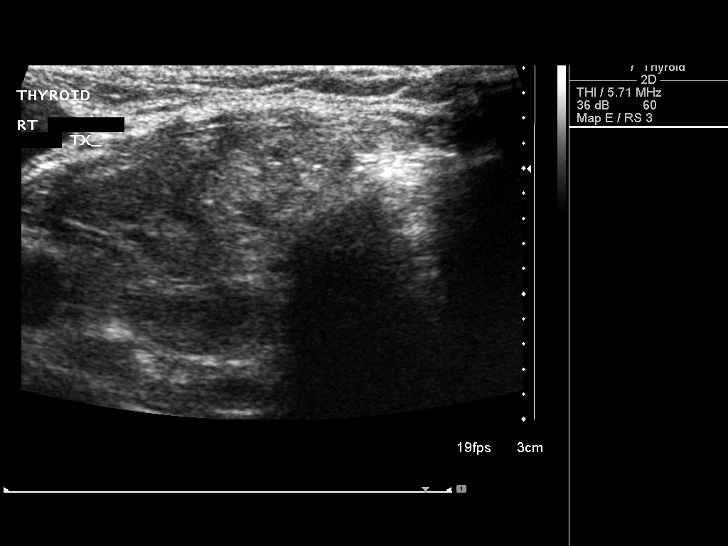
[im 21/21]
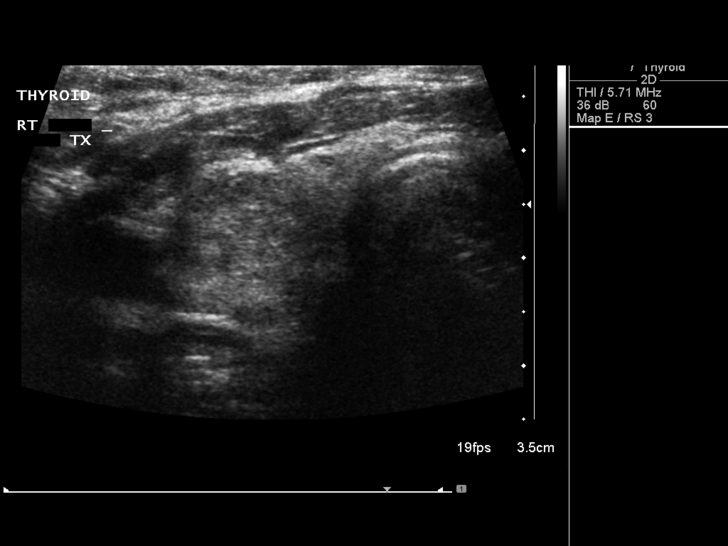

[13 of 21 positions shown; findings below may reference images not displayed]

EXAM:
ULTRASOUND GUIDED FINE NEEDLE ASPIRATION OF TWO THYROID NODULES

FLUOROSCOPY TIME:  None

MEDICATIONS:
None

ANESTHESIA/SEDATION:
Moderate sedation time:  None

PROCEDURE:
The procedure was explained to the patient. The risks and benefits
of the procedure were discussed and the patient's questions were
addressed. Informed consent was obtained from the patient. The neck
was evaluated with ultrasound. The neck was prepped with Betadine
and a sterile drape was placed. The right side of the neck was
anesthetized with lidocaine. Three ultrasound guided fine needle
aspirations were obtained within the right superior thyroid nodule
using 25 gauge needles. Three ultrasound guided guided aspirations
were obtained within the nodule along the right side of the isthmus.
FINDINGS: Multiple thyroid nodules. Dominant nodule in the superior right
thyroid lobe with multiple echogenic foci. Dominant nodule along the
right inferior aspect of the isthmus.

COMPLICATIONS:
None
IMPRESSION: Ultrasound-guided fine needle aspirations of the superior right
thyroid nodule and the right-sided isthmus nodule.

## 2013-12-25 ENCOUNTER — Encounter: Payer: Self-pay | Admitting: Internal Medicine

## 2013-12-29 ENCOUNTER — Encounter: Payer: Self-pay | Admitting: Internal Medicine

## 2013-12-31 ENCOUNTER — Encounter: Payer: Self-pay | Admitting: Family

## 2014-01-01 ENCOUNTER — Encounter: Payer: Self-pay | Admitting: Family

## 2014-01-05 ENCOUNTER — Encounter: Payer: Self-pay | Admitting: Family

## 2014-01-06 ENCOUNTER — Encounter: Payer: Self-pay | Admitting: Family

## 2014-01-06 ENCOUNTER — Encounter: Payer: Self-pay | Admitting: Internal Medicine

## 2014-01-28 ENCOUNTER — Encounter: Payer: Self-pay | Admitting: Family

## 2014-04-06 ENCOUNTER — Telehealth: Payer: Self-pay | Admitting: Family

## 2014-04-06 DIAGNOSIS — R32 Unspecified urinary incontinence: Secondary | ICD-10-CM

## 2014-04-06 NOTE — Telephone Encounter (Signed)
Spoke with pt and she states she is currently seeing a urologist for urinary incontinence and needs updated referral. Pt just saw him this morning and will follow up with him again in April.

## 2014-04-06 NOTE — Telephone Encounter (Signed)
Caller name:Loeper Jubilee Relation to HD:QQIW Call back number:[phone] Pharmacy:  Reason for call: pt is needing a referral to dr. McDiarmid a neurologist, pt has new policy under [facility]

## 2014-04-20 ENCOUNTER — Ambulatory Visit: Payer: 59 | Admitting: Internal Medicine

## 2014-06-15 ENCOUNTER — Encounter: Payer: Self-pay | Admitting: Family

## 2014-06-15 ENCOUNTER — Ambulatory Visit: Payer: 59 | Admitting: Internal Medicine

## 2014-06-15 DIAGNOSIS — E0590 Thyrotoxicosis, unspecified without thyrotoxic crisis or storm: Secondary | ICD-10-CM

## 2014-06-18 ENCOUNTER — Telehealth: Payer: Self-pay | Admitting: Family

## 2014-06-18 DIAGNOSIS — R32 Unspecified urinary incontinence: Secondary | ICD-10-CM

## 2014-06-18 NOTE — Telephone Encounter (Signed)
Will need referral along with dx code

## 2014-06-18 NOTE — Telephone Encounter (Signed)
Caller name: Florie Relation to pt: self Call back number: [phone] Pharmacy:  Reason for call:   Now has [facility] GRP: [id number] ID: [id number]  Needs referral to Dr. De Blanch, has appointment on 07/20/14 Needs referral to Dr. Allene Dillon, has appointment on 08/03/14

## 2014-06-19 NOTE — Telephone Encounter (Signed)
Please Advise/SLS

## 2014-07-20 ENCOUNTER — Encounter: Payer: Self-pay | Admitting: Internal Medicine

## 2014-07-20 ENCOUNTER — Ambulatory Visit (INDEPENDENT_AMBULATORY_CARE_PROVIDER_SITE_OTHER): Payer: 59 | Admitting: Internal Medicine

## 2014-07-20 VITALS — BP 104/64 | HR 62 | Temp 97.6°F | Resp 12 | Wt 149.0 lb

## 2014-07-20 DIAGNOSIS — E042 Nontoxic multinodular goiter: Secondary | ICD-10-CM

## 2014-07-20 DIAGNOSIS — E0590 Thyrotoxicosis, unspecified without thyrotoxic crisis or storm: Secondary | ICD-10-CM

## 2014-07-20 LAB — TSH: TSH: 1.05 u[IU]/mL (ref 0.35–4.50)

## 2014-07-20 LAB — T4, FREE: Free T4: 0.7 ng/dL (ref 0.60–1.60)

## 2014-07-20 LAB — T3, FREE: T3 FREE: 3.1 pg/mL (ref 2.3–4.2)

## 2014-07-20 NOTE — Progress Notes (Signed)
Patient ID: Ariel [name], female   DOB: 02/27/1956, 59 y.o.   MRN: [id number]   HPI  Ariel [name] is a 59 y.o.-year-old female, returnign for f/u for subclinical hyperthyroidism (resolved) and MNG.  [place] hyperthyroidism: I reviewed pt's thyroid tests: Lab Results  Component Value Date   TSH 0.714 12/19/2013   TSH 0.17* 11/19/2013   TSH 1.037 11/18/2012   FREET4 1.04 12/19/2013   FREET4 0.92 11/21/2013    She is on Hair Skin and Nails vitamin >> skipped today.  Pt c/o: - + hair loss - no fatigue - started to do yoga >> feels great! - no excessive sweating/heat intolerance - no tremors - no anxiety - + occasional palpitations; not with exercise - cardio 3x a week; yoga helps - no constipation - no weight loss/gain  She denies feeling nodules in neck, hoarseness, dysphagia/odynophagia, SOB with lying down.  She did have a thyroid U/S (11/24/2013) - multiple nodules, of which 2 larger:  Right thyroid lobe Measurements: 39 x 15 x 16 mm. Hyperemic with inhomogeneous echotexture, multiple nodules.  - Largest 15 x 12 x 12 mm solid with microcalcifications, superior pole.  - 10 x 8 x 5 mm solid, mid lobe  - 12 x 7 x 6 mm nearly isoechoic, inferior pole.   Left thyroid lobe Measurements: 39 x 15 x 11 mm. Multiple nodules.  - Largest 6 x 5 x 4 mm solid, inferior pole.  - Remainder all 5 mm or less maximum diameter.   Isthmus Thickness: 2 mm.  - Dominant right-sided 15 x 12 x 9 mm solid nodule with a few small microcalcifications.  - Smaller 5 x 2 mm nodule to the left of midline.   Lymphadenopathy: None visualized.   11/2013: FNA of the 2 dominant nodules: benign  ROS: Constitutional:see history of present illness Eyes: no blurry vision, no xerophthalmia ENT: no sore throat, no nodules palpated in throat, no dysphagia/odynophagia, no hoarseness Cardiovascular: no CP/SOB/+ palpitations/no leg swelling Respiratory: no cough/SOB Gastrointestinal: no N/V/D/C Musculoskeletal: no  muscle/no joint aches Skin: no rashes, + hair loss Neurological: no tremors/numbness/tingling/dizziness  I reviewed pt's medications, allergies, PMH, social hx, family hx, and changes were documented in the history of present illness. Otherwise, unchanged from my initial visit note.  Past Medical History  Diagnosis Date  . Chicken pox   . High cholesterol   . Urinary incontinence    Past Surgical History  Procedure Laterality Date  . Appendectomy  1962  . Hernia repair  2009  . Bladder suspension     History   Social History  . Marital Status: Married   Social History Main Topics  . Smoking status: Never Smoker   . Smokeless tobacco: No  . Alcohol Use: No  . Drug Use: No   Social History Narrative   Lives with husband   One Son- lives locally, 1 grand-daughter   One Daughter- [place]   Completed Hotel [employer] school   Works as a Probation officer.   Current Outpatient Prescriptions on File Prior to Visit  Medication Sig Dispense Refill  . Multiple Vitamin (MULTIVITAMIN) tablet Take 1 tablet by mouth daily.    . Omega-3 Fatty Acids (FISH OIL) 500 MG CAPS Take 1 capsule by mouth daily.    . vitamin C (ASCORBIC ACID) 250 MG tablet Take 250 mg by mouth daily.     No current facility-administered medications on file prior to visit.   Allergies  Allergen Reactions  . Codeine Nausea Only  Family History  Problem Relation Age of Onset  . Hyperlipidemia      died 65- "natural causes"  . Coronary artery disease Father     died at 28  . COPD Father   . Alcohol abuse Father   . Hyperlipidemia      4 siblings   . Hypertension      3 siblings  . Diabetes Mellitus II Father    PE: BP 104/64 mmHg  Pulse 62  Temp(Src) 97.6 F (36.4 C) (Oral)  Resp 12  Wt 149 lb (67.586 kg)  SpO2 96% Body mass index is 24.06 kg/(m^2). Wt Readings from Last 3 Encounters:  07/20/14 149 lb (67.586 kg)  12/19/13 146 lb (66.225 kg)  11/19/13 145 lb 8 oz (65.998 kg)    Constitutional: normal weight, in NAD Eyes: PERRLA, EOMI, no exophthalmos, no lid lag, no stare ENT: moist mucous membranes, no thyromegaly, + thyroid nodule palpated in the isthmus and also prominent left thyroid lobe; + upper bilateral cervical lymphadenopathy Cardiovascular: RRR, No MRG Respiratory: CTA B Gastrointestinal: abdomen soft, NT, ND, BS+ Musculoskeletal: no deformities, strength intact in all 4 Skin: moist, warm, no rashes Neurological: no tremor with outstretched hands, DTR normal in all 4  ASSESSMENT: 1. Subclinical hyperthyroidism  2. Multiple thyroid nodules - 2 dominant - benign FNA: Adequacy Reason Satisfactory For Evaluation. Diagnosis THYROID, FINE NEEDLE ASPIRATION, ISTHMUS, RIGHT (SPECIMEN 2 OF 2, COLLECTED ON 12/24/13): BENIGN. FINDINGS CONSISTENT WITH NON-NEOPLASTIC GOITER. Mali RUND DO Pathologist, Electronic Signature (Case signed 12/25/2013) Specimen Clinical Information Hyperthyroid, 15 x 12 x 51mm dominant right sided isthmus solid nodule with microcalcifications thyroid biopsy  Adequacy Reason Satisfactory For Evaluation. Diagnosis THYROID, FINE NEEDLE ASPIRATION, RUL (SPECIMEN 1 OF 2, COLLECTED ON 12/24/13): BENIGN. FINDINGS CONSISTENT WITH NON-NEOPLASTIC GOITER. [place] RUND DO Pathologist, Electronic Signature (Case signed 12/25/2013) Specimen Clinical Information Hyperthyroid, 15 x 12 x 62mm solid with microcalcifications right upper pole biopsy  PLAN:  1. Patient with 1 low TSH in the past (now resolved), without thyrotoxic sxs. - we discussed about the fact that Biotin in her Hair Skin and Nails vitamin can interact with out TSH assay >> artificially low TSH. He skipped the MVI today >> will recheck her TFTs - RTC in 1 year  2. Multiple thyroid nodules - patient with several thyroid nodules, largest in the right thyroid lobe and the isthmus, at 1.5 cm  - biopsy the 2 largest thyroid nodules were reviewed >> benign - we planned to  repeat the U/S after she RTC in 1 year and Bx the nodules only of a significant change is seen - she agrees with the plan.  Office Visit on 07/20/2014  Component Date Value Ref Range Status  . TSH 07/20/2014 1.05  0.35 - 4.50 uIU/mL Final  . Free T4 07/20/2014 0.70  0.60 - 1.60 ng/dL Final  . T3, Free 07/20/2014 3.1  2.3 - 4.2 pg/mL Final   Labs are great!

## 2014-07-20 NOTE — Patient Instructions (Signed)
Please return in 1 year. 

## 2014-12-21 ENCOUNTER — Ambulatory Visit (INDEPENDENT_AMBULATORY_CARE_PROVIDER_SITE_OTHER): Payer: 59 | Admitting: Family

## 2014-12-21 DIAGNOSIS — Z23 Encounter for immunization: Secondary | ICD-10-CM | POA: Diagnosis not present

## 2014-12-21 DIAGNOSIS — Z0289 Encounter for other administrative examinations: Secondary | ICD-10-CM

## 2015-01-18 ENCOUNTER — Other Ambulatory Visit: Payer: Self-pay | Admitting: Family

## 2015-01-18 DIAGNOSIS — Z1231 Encounter for screening mammogram for malignant neoplasm of breast: Secondary | ICD-10-CM

## 2015-02-01 ENCOUNTER — Ambulatory Visit (HOSPITAL_BASED_OUTPATIENT_CLINIC_OR_DEPARTMENT_OTHER)
Admission: RE | Admit: 2015-02-01 | Discharge: 2015-02-01 | Disposition: A | Discharge status: Home / Self Care | Payer: No Typology Code available for payment source | Source: Ambulatory Visit | Attending: Family | Admitting: Family

## 2015-02-01 DIAGNOSIS — Z1231 Encounter for screening mammogram for malignant neoplasm of breast: Secondary | ICD-10-CM | POA: Insufficient documentation

## 2015-03-08 ENCOUNTER — Encounter: Payer: 59 | Admitting: Family

## 2015-08-02 ENCOUNTER — Encounter: Payer: 59 | Admitting: Family

## 2015-08-16 ENCOUNTER — Telehealth: Payer: Self-pay | Admitting: Family

## 2015-08-16 NOTE — Telephone Encounter (Signed)
Noted.  Message routed to [name] for [place].

## 2015-08-16 NOTE — Telephone Encounter (Signed)
[facility] - [facility] Patient Name: Ariel [name] DOB: [birth year] Initial Comment Caller states she developed blistery rash on forehead over the weekend, may be poison ivy but concerned about shingles, has appt Wed Nurse Assessment Nurse: Markus Daft, RN, [place] Date/Time (Eastern Time): 08/16/2015 9:03:54 AM Confirm and document reason for call. If symptomatic, describe symptoms. You must click the next button to save text entered. ---Caller states she developed blistery rash on forehead over the weekend, may be poison ivy, spider bite as working in garden over the weekend, but concerned about shingles. She has appt Wed. She thinks she can wait, but wanted to be sure. Not painful. Noticed it Saturday afternoon. It looks better this AM. Has the patient traveled out of the country within the last 30 days? ---Not Applicable Does the patient have any new or worsening symptoms? ---Yes Will a triage be completed? ---Yes Related visit to physician within the last 2 weeks? ---No Does the PT have any chronic conditions? (i.e. diabetes, asthma, etc.) ---No Is this a behavioral health or substance abuse call? ---No Guidelines Guideline Title Affirmed Question Affirmed Notes Poison Ivy - Oak - Sumac Mild rash from poison ivy, oak or sumac (all triage questions negative) Final Disposition [place], RN, Sherre Poot Comments PT still plans to keep her appt on Wednesday for H&P. She no longer feels that it was a spider bite. Disagree/Comply: Comply

## 2015-08-16 NOTE — Telephone Encounter (Signed)
Pt called in because she says that she developed a rash (possible shingles) over the weekend and she would like to be advised on if she is okay to wait until scheduled appt? Transferred pt to [facility] to be advised.

## 2015-08-18 ENCOUNTER — Ambulatory Visit (INDEPENDENT_AMBULATORY_CARE_PROVIDER_SITE_OTHER): Payer: 59 | Admitting: Family

## 2015-08-18 ENCOUNTER — Encounter: Payer: Self-pay | Admitting: Family

## 2015-08-18 VITALS — HR 58 | Temp 98.7°F | Resp 18 | Ht 66.0 in | Wt 148.4 lb

## 2015-08-18 DIAGNOSIS — E2839 Other primary ovarian failure: Secondary | ICD-10-CM

## 2015-08-18 DIAGNOSIS — R6889 Other general symptoms and signs: Secondary | ICD-10-CM

## 2015-08-18 DIAGNOSIS — Z0001 Encounter for general adult medical examination with abnormal findings: Secondary | ICD-10-CM

## 2015-08-18 DIAGNOSIS — Z86010 Personal history of colonic polyps: Secondary | ICD-10-CM

## 2015-08-18 DIAGNOSIS — Z0000 Encounter for general adult medical examination without abnormal findings: Secondary | ICD-10-CM

## 2015-08-18 DIAGNOSIS — R21 Rash and other nonspecific skin eruption: Secondary | ICD-10-CM

## 2015-08-18 LAB — LIPID PANEL
CHOL/HDL RATIO: 5
Cholesterol: 261 mg/dL — ABNORMAL HIGH (ref 0–200)
HDL: 57.7 mg/dL (ref >39.00)
LDL CALC: 177 mg/dL — AB (ref 0–99)
NONHDL: 203.2
Triglycerides: 132 mg/dL (ref 0.0–149.0)
VLDL: 26.4 mg/dL (ref 0.0–40.0)

## 2015-08-18 LAB — URINALYSIS, ROUTINE W REFLEX MICROSCOPIC
Bilirubin Urine: NEGATIVE
Hgb urine dipstick: NEGATIVE
Ketones, ur: NEGATIVE
Leukocytes, UA: NEGATIVE
Nitrite: NEGATIVE
RBC / HPF: NONE SEEN (ref 0–?)
SPECIFIC GRAVITY, URINE: 1.01 (ref 1.000–1.030)
TOTAL PROTEIN, URINE-UPE24: NEGATIVE
UROBILINOGEN UA: 0.2 (ref 0.0–1.0)
Urine Glucose: NEGATIVE
WBC, UA: NONE SEEN (ref 0–?)
pH: 7.5 (ref 5.0–8.0)

## 2015-08-18 LAB — CBC WITH DIFFERENTIAL/PLATELET
BASOS ABS: 0 10*3/uL (ref 0.0–0.1)
Basophils Relative: 0.6 % (ref 0.0–3.0)
EOS ABS: 0.1 10*3/uL (ref 0.0–0.7)
Eosinophils Relative: 1.6 % (ref 0.0–5.0)
HEMATOCRIT: 39.8 % (ref 36.0–46.0)
HEMOGLOBIN: 13.2 g/dL (ref 12.0–15.0)
LYMPHS PCT: 35.1 % (ref 12.0–46.0)
Lymphs Abs: 2 10*3/uL (ref 0.7–4.0)
MCHC: 33.2 g/dL (ref 30.0–36.0)
MCV: 85.8 fl (ref 78.0–100.0)
Monocytes Absolute: 0.4 10*3/uL (ref 0.1–1.0)
Monocytes Relative: 7.9 % (ref 3.0–12.0)
Neutro Abs: 3.1 10*3/uL (ref 1.4–7.7)
Neutrophils Relative %: 54.8 % (ref 43.0–77.0)
Platelets: 226 10*3/uL (ref 150.0–400.0)
RBC: 4.64 Mil/uL (ref 3.87–5.11)
RDW: 13.4 % (ref 11.5–15.5)
WBC: 5.6 10*3/uL (ref 4.0–10.5)

## 2015-08-18 LAB — HEPATIC FUNCTION PANEL
ALT: 21 U/L (ref 0–35)
AST: 24 U/L (ref 0–37)
Albumin: 4.5 g/dL (ref 3.5–5.2)
Alkaline Phosphatase: 40 U/L (ref 39–117)
BILIRUBIN DIRECT: 0.1 mg/dL (ref 0.0–0.3)
TOTAL PROTEIN: 7.5 g/dL (ref 6.0–8.3)
Total Bilirubin: 0.6 mg/dL (ref 0.2–1.2)

## 2015-08-18 LAB — TSH: TSH: 1.06 u[IU]/mL (ref 0.35–4.50)

## 2015-08-18 LAB — BASIC METABOLIC PANEL
BUN: 10 mg/dL (ref 6–23)
CHLORIDE: 100 meq/L (ref 96–112)
CO2: 32 mEq/L (ref 19–32)
Calcium: 9.9 mg/dL (ref 8.4–10.5)
Creatinine, Ser: 0.63 mg/dL (ref 0.40–1.20)
GFR: 101.99 mL/min (ref >60.00)
Glucose, Bld: 80 mg/dL (ref 70–99)
POTASSIUM: 3.6 meq/L (ref 3.5–5.1)
Sodium: 138 mEq/L (ref 135–145)

## 2015-08-18 NOTE — Addendum Note (Signed)
Addended by: [name] on: 08/18/2015 12:50 PM   Modules accepted: Miquel Dunn

## 2015-08-18 NOTE — Progress Notes (Addendum)
Subjective:    Patient ID: Ariel [name], female    DOB: 05/22/1955, 60 y.o.   MRN: [id number]  HPI  Patient presents today for complete physical.  Immunizations: tetanus up to date Diet: healthy Exercise:  3 times a week, treadmill/elliptical, yoga Colonoscopy:1/10- doesn't remember where this was done but believes she had polyps.  Dexa:10/14 Pap Smear: 3 yrs ago-Sees Dr. Edwyna Shell Mammogram: 12/16 Dental:  Up to date Vision:  5/17  Rash- since Sunday, itchy, + exposure to poison ivy.    Review of Systems  Constitutional: Negative for unexpected weight change.  HENT: Negative for facial swelling and rhinorrhea.   Eyes: Negative for visual disturbance.  Respiratory: Negative for cough.   Cardiovascular: Negative for chest pain.  Gastrointestinal: Positive for constipation. Negative for diarrhea.  Genitourinary: Negative for dysuria and frequency.  Musculoskeletal: Negative for myalgias and arthralgias.  Skin: Positive for rash.  Neurological: Negative for headaches.  Hematological:       + swollen gland right neck  Psychiatric/Behavioral:       Denies depression/anxiety       Past Medical History  Diagnosis Date  . Chicken pox   . High cholesterol   . Urinary incontinence      Social History   Social History  . Marital Status: Married    Spouse Name: N/A  . Number of Children: N/A  . Years of Education: N/A   Occupational History  . Not on file.   Social History Main Topics  . Smoking status: Never Smoker   . Smokeless tobacco: Not on file  . Alcohol Use: No  . Drug Use: Not on file  . Sexual Activity: Not on file   Other Topics Concern  . Not on file   Social History Narrative   Lives with husband   One Son- lives locally, 1 grand-daughter   Daughter- Sport and exercise psychologist. [place]   Completed technical school   Works as a Probation officer.          Past Surgical History  Procedure Laterality Date  . Appendectomy  1962  . Hernia repair  2009  .  Bladder suspension      Family History  Problem Relation Age of Onset  . Hyperlipidemia      died 54- "natural causes"  . Coronary artery disease Father     died at 26  . COPD Father   . Alcohol abuse Father   . Hyperlipidemia      4 siblings   . Hypertension      3 siblings  . Diabetes Mellitus II Father     Allergies  Allergen Reactions  . Codeine Nausea Only    Current Outpatient Prescriptions on File Prior to Visit  Medication Sig Dispense Refill  . Multiple Vitamin (MULTIVITAMIN) tablet Take 1 tablet by mouth daily.    . Omega-3 Fatty Acids (FISH OIL) 500 MG CAPS Take 1 capsule by mouth daily.    . vitamin C (ASCORBIC ACID) 250 MG tablet Take 250 mg by mouth daily.    . Vitamin D, Cholecalciferol, 1000 UNITS CAPS Take 1 capsule by mouth daily.     No current facility-administered medications on file prior to visit.    Pulse 58  Temp(Src) 98.7 F (37.1 C) (Oral)  Resp 18  Ht 5\' 6"  (1.676 m)  Wt 148 lb 6.4 oz (67.314 kg)  BMI 23.96 kg/m2  SpO2 100%    Objective:   Physical Exam  Physical Exam  Constitutional: She  is oriented to person, place, and time. She appears well-developed and well-nourished. No distress.  HENT:  Head: Normocephalic and atraumatic.  Right Ear: Tympanic membrane and ear canal normal.  Left Ear: Tympanic membrane and ear canal normal.  Mouth/Throat: Oropharynx is clear and moist.  Eyes: Pupils are equal, round, and reactive to light. No scleral icterus.  Neck: Normal range of motion. No thyromegaly present.  Cardiovascular: Normal rate and regular rhythm.   No murmur heard. Pulmonary/Chest: Effort normal and breath sounds normal. No respiratory distress. He has no wheezes. She has no rales. She exhibits no tenderness.  Abdominal: Soft. Bowel sounds are normal. He exhibits no distension and no mass. There is no tenderness. There is no rebound and no guarding.  Musculoskeletal: She exhibits no edema.  Lymphadenopathy:    She has no  cervical adenopathy.  Neurological: She is alert and oriented to person, place, and time. She has normal reflexes. She exhibits normal muscle tone. Coordination normal.  Skin: Skin is warm and dry. + raised erythematous rash noted on midline forehead and another smaller patch right side for foreahead, small spot of erythema on scalp.  Psychiatric: She has a normal mood and affect. Her behavior is normal. Judgment and thought content normal.  Breasts: Examined lying Right: Without masses, retractions, discharge or axillary adenopathy.  Left: Without masses, retractions, discharge or axillary adenopathy.  Pelvic: deferred to GYN        Assessment & Plan:         Assessment & Plan:  Skin rash- (poison ivy) mild, advised topical 1% hydrocortisone cream  Preventative- continue healthy diet, exercise, immunizations reviewed and up to date. Refer for dexa and colo, defer pap to GYN.  Obtain routine lab work. EKG tracing is personally reviewed.  EKG notes NSR (bradycardia)  No acute changes.

## 2015-08-18 NOTE — Progress Notes (Signed)
Pre visit review using our clinic review tool, if applicable. No additional management support is needed unless otherwise documented below in the visit note. 

## 2015-08-18 NOTE — Patient Instructions (Addendum)
Please complete lab work prior to leaving. Keep up the great work with healthy diet, exercise.

## 2015-08-19 ENCOUNTER — Encounter: Payer: Self-pay | Admitting: Family

## 2015-08-19 LAB — HEPATITIS C ANTIBODY: HCV Ab: NEGATIVE

## 2015-08-20 ENCOUNTER — Encounter: Payer: Self-pay | Admitting: Family

## 2015-08-25 ENCOUNTER — Encounter: Payer: 59 | Admitting: Family

## 2015-08-27 ENCOUNTER — Telehealth: Payer: Self-pay | Admitting: Gastroenterology

## 2015-08-27 NOTE — Telephone Encounter (Signed)
Received GI records and placed on Dr. Doyne Keel desk for review.

## 2015-09-07 NOTE — Telephone Encounter (Signed)
Dr. Havery Moros reviewed records and has accepted patient. Patient is not due for next colon until 11/2016. I spoke to patient and informed her that our office will contact her when time to schedule. Recall Colon entered. Records sent to scan.

## 2015-11-15 ENCOUNTER — Ambulatory Visit (INDEPENDENT_AMBULATORY_CARE_PROVIDER_SITE_OTHER): Payer: 59 | Admitting: Family

## 2015-11-15 DIAGNOSIS — Z23 Encounter for immunization: Secondary | ICD-10-CM

## 2015-11-28 HISTORY — PX: MOUTH SURGERY: SHX715

## 2016-07-17 ENCOUNTER — Ambulatory Visit (INDEPENDENT_AMBULATORY_CARE_PROVIDER_SITE_OTHER): Payer: Self-pay | Admitting: Family

## 2016-07-17 ENCOUNTER — Telehealth: Payer: Self-pay | Admitting: Family

## 2016-07-17 ENCOUNTER — Encounter: Payer: Self-pay | Admitting: Family

## 2016-07-17 ENCOUNTER — Other Ambulatory Visit (HOSPITAL_COMMUNITY)
Admission: RE | Admit: 2016-07-17 | Discharge: 2016-07-17 | Disposition: A | Discharge status: Home / Self Care | Payer: Self-pay | Source: Ambulatory Visit | Attending: Family | Admitting: Family

## 2016-07-17 VITALS — BP 116/57 | HR 55 | Temp 98.4°F | Resp 18 | Ht 66.0 in | Wt 143.6 lb

## 2016-07-17 DIAGNOSIS — Z0000 Encounter for general adult medical examination without abnormal findings: Secondary | ICD-10-CM

## 2016-07-17 DIAGNOSIS — Z01419 Encounter for gynecological examination (general) (routine) without abnormal findings: Secondary | ICD-10-CM | POA: Insufficient documentation

## 2016-07-17 DIAGNOSIS — E2839 Other primary ovarian failure: Secondary | ICD-10-CM

## 2016-07-17 LAB — CBC WITH DIFFERENTIAL/PLATELET
BASOS PCT: 0.5 % (ref 0.0–3.0)
Basophils Absolute: 0 10*3/uL (ref 0.0–0.1)
Eosinophils Absolute: 0.1 10*3/uL (ref 0.0–0.7)
Eosinophils Relative: 1 % (ref 0.0–5.0)
HCT: 40.2 % (ref 36.0–46.0)
Hemoglobin: 13.5 g/dL (ref 12.0–15.0)
Lymphocytes Relative: 45.6 % (ref 12.0–46.0)
Lymphs Abs: 2.4 10*3/uL (ref 0.7–4.0)
MCHC: 33.7 g/dL (ref 30.0–36.0)
MCV: 86.6 fl (ref 78.0–100.0)
MONO ABS: 0.4 10*3/uL (ref 0.1–1.0)
Monocytes Relative: 6.8 % (ref 3.0–12.0)
Neutro Abs: 2.5 10*3/uL (ref 1.4–7.7)
Neutrophils Relative %: 46.1 % (ref 43.0–77.0)
Platelets: 252 10*3/uL (ref 150.0–400.0)
RBC: 4.64 Mil/uL (ref 3.87–5.11)
RDW: 13.3 % (ref 11.5–15.5)
WBC: 5.4 10*3/uL (ref 4.0–10.5)

## 2016-07-17 LAB — HEPATIC FUNCTION PANEL
ALT: 19 U/L (ref 0–35)
AST: 22 U/L (ref 0–37)
Albumin: 4.7 g/dL (ref 3.5–5.2)
Alkaline Phosphatase: 35 U/L — ABNORMAL LOW (ref 39–117)
BILIRUBIN TOTAL: 0.6 mg/dL (ref 0.2–1.2)
Bilirubin, Direct: 0.2 mg/dL (ref 0.0–0.3)
Total Protein: 7.4 g/dL (ref 6.0–8.3)

## 2016-07-17 LAB — URINALYSIS, ROUTINE W REFLEX MICROSCOPIC
BILIRUBIN URINE: NEGATIVE
HGB URINE DIPSTICK: NEGATIVE
LEUKOCYTES UA: NEGATIVE
NITRITE: NEGATIVE
Specific Gravity, Urine: 1.015 (ref 1.000–1.030)
Total Protein, Urine: NEGATIVE
Urine Glucose: NEGATIVE
Urobilinogen, UA: 0.2 (ref 0.0–1.0)
pH: 6.5 (ref 5.0–8.0)

## 2016-07-17 LAB — LIPID PANEL
CHOLESTEROL: 228 mg/dL — AB (ref 0–200)
HDL: 60.5 mg/dL (ref >39.00)
LDL Cholesterol: 147 mg/dL — ABNORMAL HIGH (ref 0–99)
NonHDL: 167.81
TRIGLYCERIDES: 103 mg/dL (ref 0.0–149.0)
Total CHOL/HDL Ratio: 4
VLDL: 20.6 mg/dL (ref 0.0–40.0)

## 2016-07-17 LAB — BASIC METABOLIC PANEL
BUN: 10 mg/dL (ref 6–23)
CHLORIDE: 101 meq/L (ref 96–112)
CO2: 30 meq/L (ref 19–32)
Calcium: 10 mg/dL (ref 8.4–10.5)
Creatinine, Ser: 0.66 mg/dL (ref 0.40–1.20)
GFR: 96.37 mL/min (ref >60.00)
Glucose, Bld: 79 mg/dL (ref 70–99)
Potassium: 3.4 mEq/L — ABNORMAL LOW (ref 3.5–5.1)
Sodium: 138 mEq/L (ref 135–145)

## 2016-07-17 LAB — TSH: TSH: 0.64 u[IU]/mL (ref 0.35–4.50)

## 2016-07-17 NOTE — Addendum Note (Signed)
Addended by: Kelle Darting A on: 07/17/2016 02:27 PM   Modules accepted: Orders

## 2016-07-17 NOTE — Progress Notes (Addendum)
Subjective:    Patient ID: Ariel [name], female    DOB: [birth year], 61 y.o.   MRN: [id number]  HPI  Patient presents today for complete physical.  Immunizations: tetanus up to date Diet: healthy Exercise: regular exercise 2-3 times a week Colonoscopy: 8 years ago, due 10/18 with Dr. Havery Moros Dexa: 2014 Pap Smear: 4 yrs ago  Mammogram:  due  Wt Readings from Last 3 Encounters:  07/17/16 143 lb 9.6 oz (65.1 kg)  08/18/15 148 lb 6.4 oz (67.3 kg)  07/20/14 149 lb (67.6 kg)       Review of Systems  Constitutional: Negative for unexpected weight change.  HENT: Negative for hearing loss and rhinorrhea.   Eyes: Negative for visual disturbance.  Respiratory: Negative for cough.   Cardiovascular: Negative for leg swelling.  Gastrointestinal: Positive for constipation. Negative for blood in stool and diarrhea.       Chronic constipation  Genitourinary: Negative for dysuria, frequency and hematuria.  Musculoskeletal: Negative for arthralgias and myalgias.  Skin: Negative for rash.  Neurological: Negative for headaches.  Hematological: Negative for adenopathy.  Psychiatric/Behavioral:       Denies depression/anxiety       Past Medical History:  Diagnosis Date  . Chicken pox   . High cholesterol   . Urinary incontinence      Social History   Social History  . Marital status: Married    Spouse name: N/A  . Number of children: N/A  . Years of education: N/A   Occupational History  . Not on file.   Social History Main Topics  . Smoking status: Never Smoker  . Smokeless tobacco: Never Used  . Alcohol use No  . Drug use: Unknown  . Sexual activity: Not on file   Other Topics Concern  . Not on file   Social History Narrative   Lives with husband   One Son- lives locally, 1 grand-daughter   Daughter- Sport and exercise psychologist. [place]   Completed technical school   Works as a Probation officer.          Past Surgical History:  Procedure Laterality Date  . APPENDECTOMY   1962  . BLADDER SUSPENSION    . HERNIA REPAIR  2009  . MOUTH SURGERY  11/2015   gum surgery and bone implant    Family History  Problem Relation Age of Onset  . Hyperlipidemia Unknown        died 7- "natural causes"  . Coronary artery disease Father        died at 75  . COPD Father   . Alcohol abuse Father   . Diabetes Mellitus II Father   . Hyperlipidemia Unknown        4 siblings   . Hypertension Unknown        3 siblings    Allergies  Allergen Reactions  . Codeine Nausea Only    Current Outpatient Prescriptions on File Prior to Visit  Medication Sig Dispense Refill  . Multiple Vitamin (MULTIVITAMIN) tablet Take 1 tablet by mouth daily.    . Omega-3 Fatty Acids (FISH OIL) 500 MG CAPS Take 1 capsule by mouth daily.    . vitamin C (ASCORBIC ACID) 250 MG tablet Take 250 mg by mouth daily.    . Vitamin D, Cholecalciferol, 1000 UNITS CAPS Take 1 capsule by mouth daily.     No current facility-administered medications on file prior to visit.     BP (!) 116/57 (BP Location: Right Arm, Cuff Size: Normal)  Pulse (!) 55   Temp 98.4 F (36.9 C) (Oral)   Resp 18   Ht 5\' 6"  (1.676 m)   Wt 143 lb 9.6 oz (65.1 kg)   SpO2 100%   BMI 23.18 kg/m    Objective:   Physical Exam  Physical Exam  Constitutional: She is oriented to person, place, and time. She appears well-developed and well-nourished. No distress.  HENT:  Head: Normocephalic and atraumatic.  Right Ear: Tympanic membrane and ear canal normal.  Left Ear: Tympanic membrane and ear canal normal.  Mouth/Throat: Oropharynx is clear and moist.  Eyes: Pupils are equal, round, and reactive to light. No scleral icterus.  Neck: Normal range of motion. No thyromegaly present.  Cardiovascular: Normal rate and regular rhythm.   No murmur heard. Pulmonary/Chest: Effort normal and breath sounds normal. No respiratory distress. He has no wheezes. She has no rales. She exhibits no tenderness.  Abdominal: Soft. Bowel sounds  are normal. She exhibits no distension and no mass. There is no tenderness. There is no rebound and no guarding.  Musculoskeletal: She exhibits no edema.  Lymphadenopathy:    She has no cervical adenopathy.  Neurological: She is alert and oriented to person, place, and time. She has normal patellar reflexes. She exhibits normal muscle tone. Coordination normal.  Skin: Skin is warm and dry.  Psychiatric: She has a normal mood and affect. Her behavior is normal. Judgment and thought content normal.  Breasts: Examined lying Right: Without masses, retractions, discharge or axillary adenopathy.  Left: Without masses, retractions, discharge or axillary adenopathy.  Inguinal/mons: Normal without inguinal adenopathy  External genitalia: Normal  BUS/Urethra/Skene's glands: Normal  Bladder: Normal  Vagina: Normal  Cervix: Normal  Uterus: normal in size, shape and contour. Midline and mobile  Adnexa/parametria:  Rt: Without masses or tenderness.  Lt: Without masses or tenderness.  Anus and perineum: Normal            Assessment & Plan:   Preventative care- encouraged patient to continue healthy diet, exercise and weight loss. Refer for mammo, colo, dexa.  Tetanus up to date. Pap performed with HPV. Obtain routine lab work.  EKG tracing is personally reviewed.  EKG notes NSR (sinus brady). No acute changes.      Assessment & Plan:

## 2016-07-17 NOTE — Telephone Encounter (Signed)
See mychart.  

## 2016-07-17 NOTE — Patient Instructions (Signed)
Continue healthy diet and regular exercise. Complete lab work prior to leaving. You are due for colonoscopy with Dr. Havery Moros in October. Please schedule mammogram on the first floor. You will be contacted about scheduling your bone density test.   Follow up in 1 year for annual physical.

## 2016-07-19 LAB — CYTOLOGY - PAP
Diagnosis: NEGATIVE
HPV: NOT DETECTED

## 2016-07-20 ENCOUNTER — Telehealth: Payer: Self-pay | Admitting: Family

## 2016-07-20 DIAGNOSIS — E876 Hypokalemia: Secondary | ICD-10-CM

## 2016-07-20 NOTE — Telephone Encounter (Signed)
Please let pt know that her potassium is a bit low.  I would recommend that she focus on incorporating more high potassium foods in her diet.  Repeat bmet in 2 weeks, dx hypokalemia.   Pap is negative.  cholesterol is mildly elevated. (looks better than last year). Please work on low fat/low cholesterol diet and continue regular exercise.

## 2016-07-21 NOTE — Telephone Encounter (Signed)
Notified pt and scheduled lab appt for 08/04/16 and future order has been entered.

## 2016-08-04 ENCOUNTER — Other Ambulatory Visit: Payer: Self-pay

## 2016-08-07 ENCOUNTER — Encounter: Payer: 59 | Admitting: Family

## 2016-11-23 ENCOUNTER — Encounter: Payer: Self-pay | Admitting: Gastroenterology

## 2016-12-20 ENCOUNTER — Ambulatory Visit: Payer: Self-pay | Admitting: *Deleted

## 2016-12-20 DIAGNOSIS — Z23 Encounter for immunization: Secondary | ICD-10-CM

## 2017-03-06 ENCOUNTER — Ambulatory Visit (INDEPENDENT_AMBULATORY_CARE_PROVIDER_SITE_OTHER): Payer: 59 | Admitting: Internal Medicine

## 2017-03-06 ENCOUNTER — Encounter: Payer: Self-pay | Admitting: Internal Medicine

## 2017-03-06 VITALS — BP 122/74 | HR 75 | Ht 66.0 in | Wt 145.6 lb

## 2017-03-06 DIAGNOSIS — E042 Nontoxic multinodular goiter: Secondary | ICD-10-CM | POA: Diagnosis not present

## 2017-03-06 DIAGNOSIS — L659 Nonscarring hair loss, unspecified: Secondary | ICD-10-CM | POA: Diagnosis not present

## 2017-03-06 DIAGNOSIS — E0590 Thyrotoxicosis, unspecified without thyrotoxic crisis or storm: Secondary | ICD-10-CM

## 2017-03-06 LAB — T4, FREE: Free T4: 0.82 ng/dL (ref 0.60–1.60)

## 2017-03-06 LAB — T3, FREE: T3, Free: 3.3 pg/mL (ref 2.3–4.2)

## 2017-03-06 LAB — TSH: TSH: 0.74 u[IU]/mL (ref 0.35–4.50)

## 2017-03-06 LAB — VITAMIN B12: Vitamin B-12: 396 pg/mL (ref 211–911)

## 2017-03-06 NOTE — Patient Instructions (Addendum)
Please stop at the lab.  If results are normal, start a multivitamin.  Return to see me as needed.

## 2017-03-06 NOTE — Progress Notes (Signed)
Patient ID: Mahealani Sulak, female   DOB: [birth year], 62 y.o.   MRN: [id number]   HPI  Wynelle Dreier is a 62 y.o.-year-old female, returnign for f/u for subclinical hyperthyroidism (resolved) and MNG.  Last visit more than 2.5 years ago.  She noticed 6 mo ago: more hair loss, more dry skin.  She acknowledges more stress in the last months with her husband being sick (progressive supranuclear paralysis).  He is in a wheelchair now.  She is the caregiver. She sees a Social worker which helps a lot.  [place] hyperthyroidism: I reviewed pt's thyroid tests -normal in the last 4 years: Lab Results  Component Value Date   TSH 0.64 07/17/2016   TSH 1.06 08/18/2015   TSH 1.05 07/20/2014   TSH 0.714 12/19/2013   TSH 0.17 (L) 11/19/2013   TSH 1.037 11/18/2012   FREET4 0.70 07/20/2014   FREET4 1.04 12/19/2013   FREET4 0.92 11/21/2013     She continues on her skin and nails vitamins.  She did not take it for 1 week in preparation for labs today .  Pt denies: - feeling nodules in neck - hoarseness - dysphagia - choking - SOB with lying down  Thyroid U/S (11/24/2013) - multiple nodules, of which 2 larger:  Right thyroid lobe Measurements: 39 x 15 x 16 mm. Hyperemic with inhomogeneous echotexture, multiple nodules.  - Largest 15 x 12 x 12 mm solid with microcalcifications, superior pole.  - 10 x 8 x 5 mm solid, mid lobe  - 12 x 7 x 6 mm nearly isoechoic, inferior pole.   Left thyroid lobe Measurements: 39 x 15 x 11 mm. Multiple nodules: - Largest 6 x 5 x 4 mm solid, inferior pole.  - Remainder all 5 mm or less maximum diameter.   Isthmus Thickness: 2 mm.  - Dominant right-sided 15 x 12 x 9 mm solid nodule with a few small microcalcifications.  - Smaller 5 x 2 mm nodule to the left of midline.   Lymphadenopathy: None visualized.   11/2013: FNA of the 2 dominant nodules: Benign  ROS: Constitutional: no weight gain/no weight loss, + fatigue, no subjective hyperthermia, no subjective hypothermia,  + nocturia Eyes: no blurry vision, no xerophthalmia ENT: no sore throat, + see HPI Cardiovascular: no CP/no SOB/no palpitations/no leg swelling Respiratory: no cough/no SOB/no wheezing Gastrointestinal: no N/no V/no D/+ C/no acid reflux Musculoskeletal: no muscle aches/+ joint aches Skin: no rashes, + hair loss Neurological: no tremors/no numbness/no tingling/no dizziness  I reviewed pt's medications, allergies, PMH, social hx, family hx, and changes were documented in the history of present illness. Otherwise, unchanged from my initial visit note.  Past Medical History:  Diagnosis Date  . Chicken pox   . High cholesterol   . Urinary incontinence    Past Surgical History:  Procedure Laterality Date  . APPENDECTOMY  1962  . BLADDER SUSPENSION    . HERNIA REPAIR  2009  . MOUTH SURGERY  11/2015   gum surgery and bone implant   History   Social History  . Marital Status: Married   Social History Main Topics  . Smoking status: Never Smoker   . Smokeless tobacco: No  . Alcohol Use: No  . Drug Use: No   Social History Narrative   Lives with husband   One Son- lives locally, 1 grand-daughter   One Daughter- [place]   Completed Hotel manager school   Works as a Probation officer.   Current Outpatient Medications on File Prior to  Visit  Medication Sig Dispense Refill  . Multiple Vitamin (MULTIVITAMIN) tablet Take 1 tablet by mouth daily.    . Omega-3 Fatty Acids (FISH OIL) 500 MG CAPS Take 1 capsule by mouth daily.    . vitamin C (ASCORBIC ACID) 250 MG tablet Take 250 mg by mouth daily.    . Vitamin D, Cholecalciferol, 1000 UNITS CAPS Take 1 capsule by mouth daily.     No current facility-administered medications on file prior to visit.    Allergies  Allergen Reactions  . Codeine Nausea Only   Family History  Problem Relation Age of Onset  . Hyperlipidemia Unknown        died 54- "natural causes"  . Coronary artery disease Father        died at 59  . COPD Father    . Alcohol abuse Father   . Diabetes Mellitus II Father   . Hyperlipidemia Unknown        4 siblings   . Hypertension Unknown        3 siblings   PE: BP 122/74   Pulse 75   Ht 5\' 6"  (1.676 m)   Wt 145 lb 9.6 oz (66 kg)   SpO2 94%   BMI 23.50 kg/m  Body mass index is 23.5 kg/m. Wt Readings from Last 3 Encounters:  03/06/17 145 lb 9.6 oz (66 kg)  07/17/16 143 lb 9.6 oz (65.1 kg)  08/18/15 148 lb 6.4 oz (67.3 kg)   Constitutional: overweight, in NAD Eyes: PERRLA, EOMI, no exophthalmos ENT: moist mucous membranes, no thyromegaly,+ thyroid nodule palpated in the isthmus and also prominent left thyroid lobe;  no cervical lymphadenopathy Cardiovascular: RRR, No MRG Respiratory: CTA B Gastrointestinal: abdomen soft, NT, ND, BS+ Musculoskeletal: no deformities, strength intact in all 4 Skin: moist, warm, no rashes Neurological: no tremor with outstretched hands, DTR normal in all 4  ASSESSMENT: 1. Subclinical hyperthyroidism  2. Multiple thyroid nodules - 2 dominant -benign FNAs: Adequacy Reason Satisfactory For Evaluation. Diagnosis THYROID, FINE NEEDLE ASPIRATION, ISTHMUS, RIGHT (SPECIMEN 2 OF 2, COLLECTED ON 12/24/13): BENIGN. FINDINGS CONSISTENT WITH NON-NEOPLASTIC GOITER. Mali RUND DO Pathologist, Electronic Signature (Case signed 12/25/2013) Specimen Clinical Information Hyperthyroid, 15 x 12 x 98mm dominant right sided isthmus solid nodule with microcalcifications thyroid biopsy  Adequacy Reason Satisfactory For Evaluation. Diagnosis THYROID, FINE NEEDLE ASPIRATION, RUL (SPECIMEN 1 OF 2, COLLECTED ON 12/24/13): BENIGN. FINDINGS CONSISTENT WITH NON-NEOPLASTIC GOITER. Mali RUND DO Pathologist, Electronic Signature (Case signed 12/25/2013) Specimen Clinical Information Hyperthyroid, 15 x 12 x 26mm solid with microcalcifications right upper pole biopsy  3. Hair loss  PLAN:  1. Patient with one low TSH value in the past, and subsequent normal thyroid labs.   Retrospectively, it is possible that her low TSH value could have been due to biotin in her [place] skin and nail vitamin.  Since then, whenever we check labs, she comes off the vitamins for about 1 week. - She describes more hair loss and dry skin since last visit and especially in the last 6 months.  She wonders whether this could be due to stress but would like to rule out an abnormality in her thyroid tests. - We will recheck her TFTs today.  - If labs are normal, I will have her return to see me as needed  2. Multiple thyroid nodules - Patient with several thyroid nodules, largest in the right thyroid lobe and in the isthmus, at 1.5 cm.  Both of these nodules were biopsied in 2015 and  the biopsies were benign. - No new neck compression symptoms  - We discussed about possibly repeating the thyroid ultrasound but she does not feel that this is necessary for now and I agree.  We discussed that with benign biopsies, the risk of the nodules becoming cancerous is very small.  The other nodules were very small and we discussed that if she starts feeling neck compression symptoms to let me know and in that case we will repeat any ultrasound to reevaluate them.  3. Hair loss - Started approximately 6 months ago, when her husband's health started to deteriorate more rapidly - Today we will check her TFTs and also I will add a B12 vitamin  - We discussed that if the tests are normal, to start a multivitamin   . Office Visit on 03/06/2017  Component Date Value Ref Range Status  . TSH 03/06/2017 0.74  0.35 - 4.50 uIU/mL Final  . Free T4 03/06/2017 0.82  0.60 - 1.60 ng/dL Final   Comment: Specimens from patients who are undergoing biotin therapy and /or ingesting biotin supplements may contain high levels of biotin.  The higher biotin concentration in these specimens interferes with this Free T4 assay.  Specimens that contain high levels  of biotin may cause false high results for this Free T4 assay.   Please interpret results in light of the total clinical presentation of the patient.    . T3, Free 03/06/2017 3.3  2.3 - 4.2 pg/mL Final  . Vitamin B-12 03/06/2017 396  211 - 911 pg/mL Final   Msg sent: Dear Ms Charleston Ropes, The thyroid tests and vitamin B12 are normal.  At this point, I would suggest to start a multivitamin daily, as we discussed. Sincerely, Philemon Kingdom MD  Philemon Kingdom, MD PhD [facility] End[facility]

## 2017-03-07 ENCOUNTER — Encounter: Payer: Self-pay | Admitting: Internal Medicine

## 2017-06-06 ENCOUNTER — Ambulatory Visit (HOSPITAL_BASED_OUTPATIENT_CLINIC_OR_DEPARTMENT_OTHER): Payer: PRIVATE HEALTH INSURANCE

## 2017-06-06 ENCOUNTER — Ambulatory Visit (INDEPENDENT_AMBULATORY_CARE_PROVIDER_SITE_OTHER): Payer: PRIVATE HEALTH INSURANCE | Admitting: Family

## 2017-06-06 ENCOUNTER — Encounter: Payer: Self-pay | Admitting: Family

## 2017-06-06 VITALS — BP 112/62 | HR 55 | Temp 98.5°F | Resp 16 | Ht 66.0 in | Wt 141.0 lb

## 2017-06-06 DIAGNOSIS — E348 Other specified endocrine disorders: Secondary | ICD-10-CM

## 2017-06-06 DIAGNOSIS — Z0000 Encounter for general adult medical examination without abnormal findings: Secondary | ICD-10-CM | POA: Diagnosis not present

## 2017-06-06 LAB — URINALYSIS, ROUTINE W REFLEX MICROSCOPIC
Bilirubin Urine: NEGATIVE
Hgb urine dipstick: NEGATIVE
Leukocytes, UA: NEGATIVE
Nitrite: NEGATIVE
PH: 7 (ref 5.0–8.0)
RBC / HPF: NONE SEEN (ref 0–?)
SPECIFIC GRAVITY, URINE: 1.01 (ref 1.000–1.030)
Total Protein, Urine: NEGATIVE
Urine Glucose: NEGATIVE
Urobilinogen, UA: 0.2 (ref 0.0–1.0)

## 2017-06-06 LAB — CBC WITH DIFFERENTIAL/PLATELET
Basophils Absolute: 0 10*3/uL (ref 0.0–0.1)
Basophils Relative: 0.4 % (ref 0.0–3.0)
EOS ABS: 0.1 10*3/uL (ref 0.0–0.7)
Eosinophils Relative: 1.5 % (ref 0.0–5.0)
HEMATOCRIT: 38.1 % (ref 36.0–46.0)
Hemoglobin: 13.2 g/dL (ref 12.0–15.0)
Lymphocytes Relative: 49.6 % — ABNORMAL HIGH (ref 12.0–46.0)
Lymphs Abs: 2 10*3/uL (ref 0.7–4.0)
MCHC: 34.8 g/dL (ref 30.0–36.0)
MCV: 85.4 fl (ref 78.0–100.0)
MONO ABS: 0.3 10*3/uL (ref 0.1–1.0)
Monocytes Relative: 8.2 % (ref 3.0–12.0)
NEUTROS ABS: 1.6 10*3/uL (ref 1.4–7.7)
Neutrophils Relative %: 40.3 % — ABNORMAL LOW (ref 43.0–77.0)
PLATELETS: 223 10*3/uL (ref 150.0–400.0)
RBC: 4.46 Mil/uL (ref 3.87–5.11)
RDW: 13 % (ref 11.5–15.5)
WBC: 4.1 10*3/uL (ref 4.0–10.5)

## 2017-06-06 LAB — TSH: TSH: 0.64 u[IU]/mL (ref 0.35–4.50)

## 2017-06-06 LAB — BASIC METABOLIC PANEL
BUN: 9 mg/dL (ref 6–23)
CO2: 30 meq/L (ref 19–32)
CREATININE: 0.64 mg/dL (ref 0.40–1.20)
Calcium: 9.5 mg/dL (ref 8.4–10.5)
Chloride: 100 mEq/L (ref 96–112)
GFR: 99.57 mL/min (ref >60.00)
Glucose, Bld: 89 mg/dL (ref 70–99)
Potassium: 4.4 mEq/L (ref 3.5–5.1)
Sodium: 136 mEq/L (ref 135–145)

## 2017-06-06 LAB — HEPATIC FUNCTION PANEL
ALT: 27 U/L (ref 0–35)
AST: 32 U/L (ref 0–37)
Albumin: 4.4 g/dL (ref 3.5–5.2)
Alkaline Phosphatase: 34 U/L — ABNORMAL LOW (ref 39–117)
BILIRUBIN DIRECT: 0.1 mg/dL (ref 0.0–0.3)
BILIRUBIN TOTAL: 0.4 mg/dL (ref 0.2–1.2)
Total Protein: 7.3 g/dL (ref 6.0–8.3)

## 2017-06-06 LAB — LIPID PANEL
CHOL/HDL RATIO: 4
Cholesterol: 197 mg/dL (ref 0–200)
HDL: 51.4 mg/dL (ref >39.00)
LDL Cholesterol: 129 mg/dL — ABNORMAL HIGH (ref 0–99)
NonHDL: 145.7
TRIGLYCERIDES: 86 mg/dL (ref 0.0–149.0)
VLDL: 17.2 mg/dL (ref 0.0–40.0)

## 2017-06-06 NOTE — Progress Notes (Signed)
Subjective:    Patient ID: Ariel [name], female    DOB: 1955-06-16, 62 y.o.   MRN: [id number]  HPI  Patient presents today for complete physical.  Immunizations: tdap Diet: reports diet is very healthy Exercise: does yoga, not a lot of cardio, stays active in her yard Colonoscopy:  2010, will be due next year.  Dexa: 2014 Pap Smear: 07/17/16 Mammogram: due     Review of Systems  Constitutional: Negative for unexpected weight change.  HENT: Negative for hearing loss and rhinorrhea.   Eyes: Negative for visual disturbance.  Respiratory: Negative for cough.   Cardiovascular: Negative for leg swelling.  Gastrointestinal: Positive for constipation. Negative for blood in stool and diarrhea.  Genitourinary: Positive for frequency. Negative for dysuria and hematuria.       Has hx of bladder surgery, denies UTI symptoms  Musculoskeletal: Negative for myalgias.       Some joint pain in hands/knees  Skin: Negative for rash.  Neurological: Negative for headaches.  Hematological: Negative for adenopathy.  Psychiatric/Behavioral:       Denies depression/anxiety   Past Medical History:  Diagnosis Date  . Chicken pox   . High cholesterol   . Urinary incontinence      Social History   Socioeconomic History  . Marital status: Married    Spouse name: Not on file  . Number of children: Not on file  . Years of education: Not on file  . Highest education level: Not on file  Occupational History  . Not on file  Social Needs  . Financial resource strain: Not on file  . Food insecurity:    Worry: Not on file    Inability: Not on file  . Transportation needs:    Medical: Not on file    Non-medical: Not on file  Tobacco Use  . Smoking status: Never Smoker  . Smokeless tobacco: Never Used  Substance and Sexual Activity  . Alcohol use: No  . Drug use: Not on file  . Sexual activity: Not on file  Lifestyle  . Physical activity:    Days per week: Not on file    Minutes per  session: Not on file  . Stress: Not on file  Relationships  . Social connections:    Talks on phone: Not on file    Gets together: Not on file    Attends religious service: Not on file    Active member of club or organization: Not on file    Attends meetings of clubs or organizations: Not on file    Relationship status: Not on file  . Intimate partner violence:    Fear of current or ex partner: Not on file    Emotionally abused: Not on file    Physically abused: Not on file    Forced sexual activity: Not on file  Other Topics Concern  . Not on file  Social History Narrative   Lives with husband   One Son- lives locally, 1 grand-daughter   Daughter- Sport and exercise psychologist. [place]   Completed technical school   Works as a Probation officer.          Past Surgical History:  Procedure Laterality Date  . APPENDECTOMY  1962  . BLADDER SUSPENSION    . HERNIA REPAIR  2009  . MOUTH SURGERY  11/2015   gum surgery and bone implant    Family History  Problem Relation Age of Onset  . Hyperlipidemia Unknown        died  97- "natural causes"  . Coronary artery disease Father        died at 75  . COPD Father   . Alcohol abuse Father   . Diabetes Mellitus II Father   . Hyperlipidemia Unknown        4 siblings   . Hypertension Unknown        3 siblings    Allergies  Allergen Reactions  . Codeine Nausea Only    Current Outpatient Medications on File Prior to Visit  Medication Sig Dispense Refill  . Multiple Vitamin (MULTIVITAMIN) tablet Take 1 tablet by mouth daily.    . Omega-3 Fatty Acids (FISH OIL) 500 MG CAPS Take 1 capsule by mouth daily.    . vitamin C (ASCORBIC ACID) 250 MG tablet Take 250 mg by mouth daily.    . Vitamin D, Cholecalciferol, 1000 UNITS CAPS Take 1 capsule by mouth daily.     No current facility-administered medications on file prior to visit.     BP 112/62 (BP Location: Right Arm, Patient Position: Sitting, Cuff Size: Small)   Pulse (!) 55   Temp 98.5 F (36.9 C)  (Oral)   Resp 16   Ht 5\' 6"  (1.676 m)   Wt 141 lb (64 kg)   SpO2 99%   BMI 22.76 kg/m        Objective:   Physical Exam  Constitutional: She is oriented to person, place, and time. She appears well-developed and well-nourished.  HENT:  Head: Normocephalic and atraumatic.  Right Ear: Tympanic membrane and ear canal normal.  Left Ear: Tympanic membrane and ear canal normal.  Mouth/Throat: No oropharyngeal exudate or posterior oropharyngeal erythema.  Eyes: Pupils are equal, round, and reactive to light. Conjunctivae are normal. No scleral icterus.  Cardiovascular: Normal rate, regular rhythm and normal heart sounds.  No murmur heard. Pulmonary/Chest: Effort normal and breath sounds normal. No respiratory distress. She has no wheezes.  Musculoskeletal: She exhibits no edema.  Neurological: She is alert and oriented to person, place, and time.  Skin: Skin is warm and dry.  Psychiatric: She has a normal mood and affect. Her behavior is normal. Judgment and thought content normal.          Assessment & Plan:  Preventative care- encouraged pt to continue healthy diet and regular exercise. Will refer for mammo, dexa. Obtain routine lab work.  Colo will be due next year. Also needs shingrix but we are out of stock. Advised pt to check availability with her local pharmacy.

## 2017-06-06 NOTE — Patient Instructions (Addendum)
Please check with your pharmacy to see if they have shingrix in stock.  This is a 2 dose series. Please complete lab work prior to leaving.   Preventive Care 40-64 Years, Female Preventive care refers to lifestyle choices and visits with your health care provider that can promote health and wellness. What does preventive care include?  A yearly physical exam. This is also called an annual well check.  Dental exams once or twice a year.  Routine eye exams. Ask your health care provider how often you should have your eyes checked.  Personal lifestyle choices, including: ? Daily care of your teeth and gums. ? Regular physical activity. ? Eating a healthy diet. ? Avoiding tobacco and drug use. ? Limiting alcohol use. ? Practicing safe sex. ? Taking low-dose aspirin daily starting at age 54. ? Taking vitamin and mineral supplements as recommended by your health care provider. What happens during an annual well check? The services and screenings done by your health care provider during your annual well check will depend on your age, overall health, lifestyle risk factors, and family history of disease. Counseling Your health care provider may ask you questions about your:  Alcohol use.  Tobacco use.  Drug use.  Emotional well-being.  Home and relationship well-being.  Sexual activity.  Eating habits.  Work and work Statistician.  Method of birth control.  Menstrual cycle.  Pregnancy history.  Screening You may have the following tests or measurements:  Height, weight, and BMI.  Blood pressure.  Lipid and cholesterol levels. These may be checked every 5 years, or more frequently if you are over 2 years old.  Skin check.  Lung cancer screening. You may have this screening every year starting at age 61 if you have a 30-pack-year history of smoking and currently smoke or have quit within the past 15 years.  Fecal occult blood test (FOBT) of the stool. You may have  this test every year starting at age 51.  Flexible sigmoidoscopy or colonoscopy. You may have a sigmoidoscopy every 5 years or a colonoscopy every 10 years starting at age 48.  Hepatitis C blood test.  Hepatitis B blood test.  Sexually transmitted disease (STD) testing.  Diabetes screening. This is done by checking your blood sugar (glucose) after you have not eaten for a while (fasting). You may have this done every 1-3 years.  Mammogram. This may be done every 1-2 years. Talk to your health care provider about when you should start having regular mammograms. This may depend on whether you have a family history of breast cancer.  BRCA-related cancer screening. This may be done if you have a family history of breast, ovarian, tubal, or peritoneal cancers.  Pelvic exam and Pap test. This may be done every 3 years starting at age 72. Starting at age 33, this may be done every 5 years if you have a Pap test in combination with an HPV test.  Bone density scan. This is done to screen for osteoporosis. You may have this scan if you are at high risk for osteoporosis.  Discuss your test results, treatment options, and if necessary, the need for more tests with your health care provider. Vaccines Your health care provider may recommend certain vaccines, such as:  Influenza vaccine. This is recommended every year.  Tetanus, diphtheria, and acellular pertussis (Tdap, Td) vaccine. You may need a Td booster every 10 years.  Varicella vaccine. You may need this if you have not been vaccinated.  Zoster  vaccine. You may need this after age 57.  Measles, mumps, and rubella (MMR) vaccine. You may need at least one dose of MMR if you were born in 1957 or later. You may also need a second dose.  Pneumococcal 13-valent conjugate (PCV13) vaccine. You may need this if you have certain conditions and were not previously vaccinated.  Pneumococcal polysaccharide (PPSV23) vaccine. You may need one or two  doses if you smoke cigarettes or if you have certain conditions.  Meningococcal vaccine. You may need this if you have certain conditions.  Hepatitis A vaccine. You may need this if you have certain conditions or if you travel or work in places where you may be exposed to hepatitis A.  Hepatitis B vaccine. You may need this if you have certain conditions or if you travel or work in places where you may be exposed to hepatitis B.  Haemophilus influenzae type b (Hib) vaccine. You may need this if you have certain conditions.  Talk to your health care provider about which screenings and vaccines you need and how often you need them. This information is not intended to replace advice given to you by your health care provider. Make sure you discuss any questions you have with your health care provider. Document Released: 03/12/2015 Document Revised: 11/03/2015 Document Reviewed: 12/15/2014 Elsevier Interactive Patient Education  [employer].

## 2017-06-25 ENCOUNTER — Ambulatory Visit (HOSPITAL_BASED_OUTPATIENT_CLINIC_OR_DEPARTMENT_OTHER)
Admission: RE | Admit: 2017-06-25 | Discharge: 2017-06-25 | Disposition: A | Discharge status: Home / Self Care | Payer: No Typology Code available for payment source | Source: Ambulatory Visit | Attending: Family | Admitting: Family

## 2017-06-25 DIAGNOSIS — Z13820 Encounter for screening for osteoporosis: Secondary | ICD-10-CM | POA: Diagnosis present

## 2017-06-25 DIAGNOSIS — Z780 Asymptomatic menopausal state: Secondary | ICD-10-CM | POA: Diagnosis not present

## 2017-06-25 DIAGNOSIS — E348 Other specified endocrine disorders: Secondary | ICD-10-CM | POA: Insufficient documentation

## 2017-06-25 DIAGNOSIS — Z0000 Encounter for general adult medical examination without abnormal findings: Secondary | ICD-10-CM

## 2017-06-25 DIAGNOSIS — Z1231 Encounter for screening mammogram for malignant neoplasm of breast: Secondary | ICD-10-CM | POA: Insufficient documentation

## 2017-11-07 ENCOUNTER — Ambulatory Visit (INDEPENDENT_AMBULATORY_CARE_PROVIDER_SITE_OTHER): Payer: PRIVATE HEALTH INSURANCE

## 2017-11-07 DIAGNOSIS — Z23 Encounter for immunization: Secondary | ICD-10-CM

## 2017-11-19 ENCOUNTER — Encounter: Payer: Self-pay | Admitting: Family

## 2017-11-19 DIAGNOSIS — M2550 Pain in unspecified joint: Secondary | ICD-10-CM

## 2017-11-20 NOTE — Addendum Note (Signed)
Addended by: [name] on: 11/20/2017 08:56 AM   Modules accepted: Orders

## 2017-11-21 ENCOUNTER — Other Ambulatory Visit (INDEPENDENT_AMBULATORY_CARE_PROVIDER_SITE_OTHER): Payer: PRIVATE HEALTH INSURANCE

## 2017-11-21 DIAGNOSIS — M2550 Pain in unspecified joint: Secondary | ICD-10-CM

## 2017-11-21 LAB — SEDIMENTATION RATE: Sed Rate: 6 mm/hr (ref 0–30)

## 2017-11-22 LAB — RHEUMATOID FACTOR

## 2017-11-22 LAB — ANA: Anti Nuclear Antibody(ANA): NEGATIVE

## 2017-11-23 ENCOUNTER — Encounter: Payer: Self-pay | Admitting: Family

## 2017-12-27 ENCOUNTER — Encounter: Payer: Self-pay | Admitting: Family

## 2017-12-27 MED ORDER — BETAMETHASONE VALERATE 0.1 % EX OINT: 1.0000 "application " | TOPICAL_OINTMENT | Freq: Two times a day (BID) | CUTANEOUS | 0 refills | Status: DC

## 2018-04-04 ENCOUNTER — Encounter: Payer: Self-pay | Admitting: Family

## 2018-04-10 ENCOUNTER — Ambulatory Visit (INDEPENDENT_AMBULATORY_CARE_PROVIDER_SITE_OTHER): Payer: No Typology Code available for payment source

## 2018-04-10 ENCOUNTER — Ambulatory Visit: Payer: PRIVATE HEALTH INSURANCE

## 2018-04-10 DIAGNOSIS — Z23 Encounter for immunization: Secondary | ICD-10-CM | POA: Diagnosis not present

## 2018-04-10 NOTE — Patient Instructions (Signed)
Please schedule your next Shingrix vaccine for 2 months from today.

## 2018-04-10 NOTE — Progress Notes (Signed)
Pre visit review using our clinic review tool, if applicable. No additional management support is needed unless otherwise documented below in the visit note.  Pt here today for Shingrix #1. 0.61mL injected into R deltoid. Pt tolerated injection well. Next in 2 months.

## 2018-04-10 NOTE — Progress Notes (Signed)
Reviewed. ° ° °Sylina Henion S O'Sullivan NP °

## 2018-06-12 ENCOUNTER — Ambulatory Visit: Payer: No Typology Code available for payment source

## 2018-07-10 ENCOUNTER — Ambulatory Visit (INDEPENDENT_AMBULATORY_CARE_PROVIDER_SITE_OTHER): Payer: No Typology Code available for payment source

## 2018-07-10 ENCOUNTER — Other Ambulatory Visit: Payer: Self-pay

## 2018-07-10 DIAGNOSIS — Z23 Encounter for immunization: Secondary | ICD-10-CM | POA: Diagnosis not present

## 2018-07-10 NOTE — Progress Notes (Signed)
Pt here today with for 2nd Shingrix. 0.30mL injected into R deltoid. Pt tolerated injection well.

## 2018-07-10 NOTE — Progress Notes (Signed)
Noted.   Nance Pear NP

## 2018-09-24 ENCOUNTER — Encounter: Payer: Self-pay | Admitting: Family

## 2018-10-01 ENCOUNTER — Other Ambulatory Visit: Payer: Self-pay

## 2018-10-01 ENCOUNTER — Ambulatory Visit (INDEPENDENT_AMBULATORY_CARE_PROVIDER_SITE_OTHER): Payer: No Typology Code available for payment source | Admitting: Family

## 2018-10-01 ENCOUNTER — Encounter: Payer: Self-pay | Admitting: Family

## 2018-10-01 VITALS — BP 109/59 | HR 68 | Temp 98.8°F | Resp 16 | Wt 148.0 lb

## 2018-10-01 DIAGNOSIS — R0602 Shortness of breath: Secondary | ICD-10-CM | POA: Diagnosis not present

## 2018-10-01 LAB — D-DIMER, QUANTITATIVE (NOT AT ARMC): D-Dimer, Quant: <0.19 mcg/mL FEU (ref <0.50)

## 2018-10-01 NOTE — Progress Notes (Signed)
Subjective:    Patient ID: Ariel [name], female    DOB: March 04, 1955, 62 y.o.   MRN: [id number]  HPI  Patient is a 63 yr old female who presents today with chief complaint of SOB with walking. Denies CP, denies palpitations.  Concerned about the possibility of a heart blockage. Reports that her sister had a stent placed in January.  She also notes that she has had a lot of stress lately as she is the caregiver for her husband who is quite ill.     Review of Systems Past Medical History:  Diagnosis Date  . Chicken pox   . High cholesterol   . Urinary incontinence      Social History   Socioeconomic History  . Marital status: Married    Spouse name: Not on file  . Number of children: Not on file  . Years of education: Not on file  . Highest education level: Not on file  Occupational History  . Not on file  Social Needs  . Financial resource strain: Not on file  . Food insecurity    Worry: Not on file    Inability: Not on file  . Transportation needs    Medical: Not on file    Non-medical: Not on file  Tobacco Use  . Smoking status: Never Smoker  . Smokeless tobacco: Never Used  Substance and Sexual Activity  . Alcohol use: No  . Drug use: Not on file  . Sexual activity: Not on file  Lifestyle  . Physical activity    Days per week: Not on file    Minutes per session: Not on file  . Stress: Not on file  Relationships  . Social Herbalist on phone: Not on file    Gets together: Not on file    Attends religious service: Not on file    Active member of club or organization: Not on file    Attends meetings of clubs or organizations: Not on file    Relationship status: Not on file  . Intimate partner violence    Fear of current or ex partner: Not on file    Emotionally abused: Not on file    Physically abused: Not on file    Forced sexual activity: Not on file  Other Topics Concern  . Not on file  Social History Narrative   Lives with husband   One  Son- lives locally, 1 grand-daughter   Daughter- Sport and exercise psychologist. [place]   Completed technical school   Works as a Probation officer.          Past Surgical History:  Procedure Laterality Date  . APPENDECTOMY  1962  . BLADDER SUSPENSION    . HERNIA REPAIR  2009  . MOUTH SURGERY  11/2015   gum surgery and bone implant    Family History  Problem Relation Age of Onset  . Hyperlipidemia Unknown        died 38- "natural causes"  . Coronary artery disease Father        died at 47  . COPD Father   . Alcohol abuse Father   . Diabetes Mellitus II Father   . Hyperlipidemia Unknown        4 siblings   . Hypertension Unknown        3 siblings    Allergies  Allergen Reactions  . Codeine Nausea Only    Current Outpatient Medications on File Prior to Visit  Medication Sig Dispense Refill  .  Multiple Vitamin (MULTIVITAMIN) tablet Take 1 tablet by mouth daily.    . Omega-3 Fatty Acids (FISH OIL) 500 MG CAPS Take 1 capsule by mouth daily.    . betamethasone valerate ointment (VALISONE) 0.1 % Apply 1 application topically 2 (two) times daily. 30 g 0  . vitamin C (ASCORBIC ACID) 250 MG tablet Take 250 mg by mouth daily.    . Vitamin D, Cholecalciferol, 1000 UNITS CAPS Take 1 capsule by mouth daily.     No current facility-administered medications on file prior to visit.     BP (!) 109/59 (BP Location: Right Arm, Patient Position: Sitting, Cuff Size: Small)   Pulse 68   Temp 98.8 F (37.1 C) (Oral)   Resp 16   Wt 148 lb (67.1 kg)   SpO2 99%   BMI 23.89 kg/m       Objective:   Physical Exam Constitutional:      Appearance: She is well-developed.  Neck:     Musculoskeletal: Neck supple.     Thyroid: No thyromegaly.  Cardiovascular:     Rate and Rhythm: Normal rate and regular rhythm.     Heart sounds: Normal heart sounds. No murmur.  Pulmonary:     Effort: Pulmonary effort is normal. No respiratory distress.     Breath sounds: Normal breath sounds. No wheezing.  Skin:     General: Skin is warm and dry.  Neurological:     Mental Status: She is alert and oriented to person, place, and time.  Psychiatric:        Behavior: Behavior normal.        Thought Content: Thought content normal.        Judgment: Judgment normal.           Assessment & Plan:  DOE- check baseline lab work. Will also plan to obtain 2d echo and refer to cardiology for further evaluation.EKG tracing is personally reviewed.  EKG notes NSR.  No acute changes.

## 2018-10-02 ENCOUNTER — Encounter: Payer: Self-pay | Admitting: Family

## 2018-10-02 LAB — COMPREHENSIVE METABOLIC PANEL
ALT: 21 U/L (ref 0–35)
AST: 22 U/L (ref 0–37)
Albumin: 4.6 g/dL (ref 3.5–5.2)
Alkaline Phosphatase: 44 U/L (ref 39–117)
BUN: 12 mg/dL (ref 6–23)
CO2: 29 mEq/L (ref 19–32)
Calcium: 10 mg/dL (ref 8.4–10.5)
Chloride: 102 mEq/L (ref 96–112)
Creatinine, Ser: 0.65 mg/dL (ref 0.40–1.20)
GFR: 91.63 mL/min (ref >60.00)
Glucose, Bld: 133 mg/dL — ABNORMAL HIGH (ref 70–99)
Potassium: 3.8 mEq/L (ref 3.5–5.1)
Sodium: 138 mEq/L (ref 135–145)
Total Bilirubin: 0.3 mg/dL (ref 0.2–1.2)
Total Protein: 7.5 g/dL (ref 6.0–8.3)

## 2018-10-08 NOTE — Progress Notes (Signed)
Cardiology Office Note:    Date:  10/09/2018   ID:  Ariel [name], DOB [birth year], MRN [id number]  PCP:  [name], NP  Cardiologist:  Shirlee More, MD   Referring MD: [name], NP  ASSESSMENT:    1. Dyspnea on exertion   2. Hyperlipidemia, unspecified hyperlipidemia type   3. Cardiovascular risk factor    PLAN:    In order of problems listed above:  1. Dyspnea on exertion - One episode walking [place] the hill at [facility], relieved by mask, without chest pain - has not recurred. Was very bothered by the tight mask she was wearing at the time. An echo has been ordered by her PCP. This sounds like an isolated episode and she agrees. Low concern for ischemic heart disease due to isolated episode, lack of chest pain. After educated and shared decision making she is agreeable to proceed with calcium scoring for risk stratification.  2. HLD - On fish oil OTC. If calcium score elevated will plan to start statin.  3. Cardiovascular risk evaluation - Ten year risk of cardiac event 5.8%. Risk factors include family history of cardiac disease, age, and elevated lipids. Plan for calcium scoring CT, as above. If Ca score elevated anticipate initiation of statin.   Next appointment: She will follow up as needed. If Ca score return elevated, will schedule follow up appointment.   Medication Adjustments/Labs and Tests Ordered: Current medicines are reviewed at length with the patient today.  Concerns regarding medicines are outlined above.  Orders Placed This Encounter  Procedures  . CT CARDIAC SCORING   No orders of the defined types were placed in this encounter.    Chief Complaint  Patient presents with  . Shortness of Breath    History of Present Illness:    Ariel [name] is a 63 y.o. female who is being seen today for the evaluation of exertional SOB at the request of [name], NP. She has a PMH of multiple thyroid nodule (benign biopsies in 2015, follows  with endocrinology PRN). Family history notable for CAD (sister, father), HLD, HTN, DM2. She is a never smoker. 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   She was seen by her PCP 10/01/18 for shortness of breath - echo ordered, but not yet scheduled. Labs were collected with normal electrolytes, kidney function, liver function, negative d-dimer.   She had one episode of shortness of breath with exertion. Was walking [place] in [facility] with tight cloth mask on and felt short of breath at top of hill. Lasted a few minutes was relieved by rest. No chest pain. That weekend she tells me she felt fatigued on and off. Notes increased stress recently helping her daughter plan her wedding and acting as caretaker for her husband. Has not had a recurrent episode.   She denies edema, palpitations, dizziness.   Past Medical History:  Diagnosis Date  . Chicken pox   . High cholesterol   . Urinary incontinence     Past Surgical History:  Procedure Laterality Date  . APPENDECTOMY  1962  . BLADDER SUSPENSION    . HERNIA REPAIR  2009  . MOUTH SURGERY  11/2015   gum surgery and bone implant    Current Medications: Current Meds  Medication Sig  . Multiple Vitamin (MULTIVITAMIN) tablet Take 1 tablet by mouth daily.  . Omega-3 Fatty Acids (FISH OIL) 500 MG CAPS Take 1 capsule by mouth daily.     Allergies:   Codeine  Social History   Socioeconomic History  . Marital status: Married    Spouse name: Not on file  . Number of children: Not on file  . Years of education: Not on file  . Highest education level: Not on file  Occupational History  . Not on file  Social Needs  . Financial resource strain: Not on file  . Food insecurity    Worry: Not on file    Inability: Not on file  . Transportation needs    Medical: Not on file    Non-medical: Not on file  Tobacco Use  . Smoking status: Never Smoker  . Smokeless tobacco: Never Used  Substance and Sexual Activity  . Alcohol use: Never     Frequency: Never  . Drug use: Never  . Sexual activity: Not on file  Lifestyle  . Physical activity    Days per week: Not on file    Minutes per session: Not on file  . Stress: Not on file  Relationships  . Social Herbalist on phone: Not on file    Gets together: Not on file    Attends religious service: Not on file    Active member of club or organization: Not on file    Attends meetings of clubs or organizations: Not on file    Relationship status: Not on file  Other Topics Concern  . Not on file  Social History Narrative   Lives with husband   One Son- lives locally, 1 grand-daughter   Daughter- Sport and exercise psychologist. [place]   Completed Hotel manager school   Works as a Probation officer.           Family History: The patient's family history includes Alcohol abuse in her father; COPD in her father; Coronary artery disease in her father and sister; Diabetes Mellitus II in her father; Heart attack in her brother; Hyperlipidemia in some other family members; Hypertension in an other family member.  ROS:   Review of Systems  Constitution: Negative for chills, fever and malaise/fatigue.  Cardiovascular: Positive for dyspnea on exertion (one episode). Negative for chest pain, leg swelling and palpitations.  Respiratory: Negative for cough, shortness of breath and wheezing.   Gastrointestinal: Negative for nausea and vomiting.  Neurological: Negative for dizziness, light-headedness and weakness.   Please see the history of present illness.     All other systems reviewed and are negative.  EKGs/Labs/Other Studies Reviewed:    The following studies were reviewed today:   EKG:  EKG 08/04/2020is personally reviewed and demonstrates [facility] and normal  Recent Labs: 10/01/2018: ALT 21; BUN 12; Creatinine, Ser 0.65; Potassium 3.8; Sodium 138  Recent Lipid Panel    Component Value Date/Time   CHOL 197 06/06/2017 0953   TRIG 86.0 06/06/2017 0953   HDL 51.40 06/06/2017 0953   CHOLHDL 4  06/06/2017 0953   VLDL 17.2 06/06/2017 0953   LDLCALC 129 (H) 06/06/2017 0953    Physical Exam:    VS:  BP 136/78 (BP Location: Left Arm, Patient Position: Sitting, Cuff Size: Normal)   Pulse 70   Ht 5' 6.5" (1.689 m)   Wt 149 lb (67.6 kg)   SpO2 98%   BMI 23.69 kg/m     Wt Readings from Last 3 Encounters:  10/09/18 149 lb (67.6 kg)  10/01/18 148 lb (67.1 kg)  06/06/17 141 lb (64 kg)     GEN:  Well nourished, well developed in no acute distress HEENT: Normal NECK: No JVD; No  carotid bruits LYMPHATICS: No lymphadenopathy CARDIAC: RRR, no murmurs, rubs, gallops RESPIRATORY:  Clear to auscultation without rales, wheezing or rhonchi  ABDOMEN: Soft, non-tender, non-distended MUSCULOSKELETAL:  No edema; No deformity  SKIN: Warm and dry NEUROLOGIC:  Alert and oriented x 3 PSYCHIATRIC:  Normal affect     Signed, Shirlee More, MD  10/09/2018 5:18 PM    [facility]

## 2018-10-09 ENCOUNTER — Ambulatory Visit (INDEPENDENT_AMBULATORY_CARE_PROVIDER_SITE_OTHER): Payer: No Typology Code available for payment source | Admitting: Cardiology

## 2018-10-09 ENCOUNTER — Other Ambulatory Visit: Payer: Self-pay

## 2018-10-09 ENCOUNTER — Encounter: Payer: Self-pay | Admitting: Cardiology

## 2018-10-09 VITALS — BP 136/78 | HR 70 | Ht 66.5 in | Wt 149.0 lb

## 2018-10-09 DIAGNOSIS — E785 Hyperlipidemia, unspecified: Secondary | ICD-10-CM

## 2018-10-09 DIAGNOSIS — R0609 Other forms of dyspnea: Secondary | ICD-10-CM

## 2018-10-09 DIAGNOSIS — Z9189 Other specified personal risk factors, not elsewhere classified: Secondary | ICD-10-CM

## 2018-10-09 NOTE — Patient Instructions (Addendum)
Medication Instructions:  Your physician recommends that you continue on your current medications as directed. Please refer to the Current Medication list given to you today.  If you need a refill on your cardiac medications before your next appointment, please call your pharmacy.   Lab work: NONE If you have labs (blood work) drawn today and your tests are completely normal, you will receive your results only by: Marland Kitchen MyChart Message (if you have MyChart) OR . A paper copy in the mail If you have any lab test that is abnormal or we need to change your treatment, we will call you to review the results.  Testing/Procedures:  You will be contacted to schedule your CT Calcium Scoring. Please bring your $150.00 fee with you to the appointment.   Follow-Up: At [facility], you and your health needs are our priority.  As part of our continuing mission to provide you with exceptional heart care, we have created designated Provider Care Teams.  These Care Teams include your primary Cardiologist (physician) and Advanced Practice Providers (APPs -  Physician Assistants and Nurse Practitioners) who all work together to provide you with the care you need, when you need it. You will need a follow up if symptoms worsen or fail to improve.    Coronary Calcium Scan A coronary calcium scan is an imaging test used to look for deposits of calcium and other fatty materials (plaques) in the inner lining of the blood vessels of the heart (coronary arteries). These deposits of calcium and plaques can partly clog and narrow the coronary arteries without producing any symptoms or warning signs. This puts a person at risk for a heart attack. This test can detect these deposits before symptoms develop. Tell a health care provider about:  Any allergies you have.  All medicines you are taking, including vitamins, herbs, eye drops, creams, and over-the-counter medicines.  Any problems you or family members have had  with anesthetic medicines.  Any blood disorders you have.  Any surgeries you have had.  Any medical conditions you have.  Whether you are pregnant or may be pregnant. What are the risks? Generally, this is a safe procedure. However, problems may occur, including:  Harm to a pregnant woman and her unborn baby. This test involves the use of radiation. Radiation exposure can be dangerous to a pregnant woman and her unborn baby. If you are pregnant, you generally should not have this procedure done.  Slight increase in the risk of cancer. This is because of the radiation involved in the test. What happens before the procedure? No preparation is needed for this procedure. What happens during the procedure?   You will undress and remove any jewelry around your neck or chest.  You will put on a hospital gown.  Sticky electrodes will be placed on your chest. The electrodes will be connected to an electrocardiogram (ECG) machine to record a tracing of the electrical activity of your heart.  A CT scanner will take pictures of your heart. During this time, you will be asked to lie still and hold your breath for 2-3 seconds while a picture of your heart is being taken. The procedure may vary among health care providers and hospitals. What happens after the procedure?  You can get dressed.  You can return to your normal activities.  It is up to you to get the results of your test. Ask your health care provider, or the department that is doing the test, when your results will  be ready. Summary  A coronary calcium scan is an imaging test used to look for deposits of calcium and other fatty materials (plaques) in the inner lining of the blood vessels of the heart (coronary arteries).  Generally, this is a safe procedure. Tell your health care provider if you are pregnant or may be pregnant.  No preparation is needed for this procedure.  A CT scanner will take pictures of your heart.  You  can return to your normal activities after the scan is done. This information is not intended to replace advice given to you by your health care provider. Make sure you discuss any questions you have with your health care provider. Document Released: 08/12/2007 Document Revised: 01/26/2017 Document Reviewed: 01/03/2016 Elsevier Patient Education  2020 [employer].

## 2018-11-27 ENCOUNTER — Ambulatory Visit (INDEPENDENT_AMBULATORY_CARE_PROVIDER_SITE_OTHER)
Admission: RE | Admit: 2018-11-27 | Discharge: 2018-11-27 | Disposition: A | Discharge status: Home / Self Care | Payer: Self-pay | Source: Ambulatory Visit | Attending: Cardiology | Admitting: Cardiology

## 2018-11-27 ENCOUNTER — Other Ambulatory Visit: Payer: Self-pay

## 2018-11-27 DIAGNOSIS — Z9189 Other specified personal risk factors, not elsewhere classified: Secondary | ICD-10-CM

## 2018-11-27 DIAGNOSIS — R0609 Other forms of dyspnea: Secondary | ICD-10-CM

## 2018-11-27 IMAGING — CT CT HEART SCORING
2 series · 16 of 20 positions shown, 18 images · non-contrast
Comparison: None.
COMPARISON: None.

Addendum:
EXAM:
OVER-READ INTERPRETATION  CT CHEST

The following report is an over-read performed by radiologist Dr.
AUGUSTOWSKA [REDACTED] on [DATE]. This over-read
does not include interpretation of cardiac or coronary anatomy or
pathology. The coronary calcium score interpretation by the
cardiologist is attached.
CLINICAL DATA: Risk stratification
Coronary Calcium Score
TECHNIQUE: The patient was scanned on a Siemens Somatom 64 slice scanner. Axial
non-contrast 3 mm slices were carried out through the heart. The
data set was analyzed on a dedicated work station and scored using
the Agatson method.

[Series 2: casc 3.0 i36f 2 bestdiast 67 % · axial · 0.33mm/px · z∈[-232,-121]mm · 8 of 49 slices shown, 10 images]
[im 6/49  vessel]
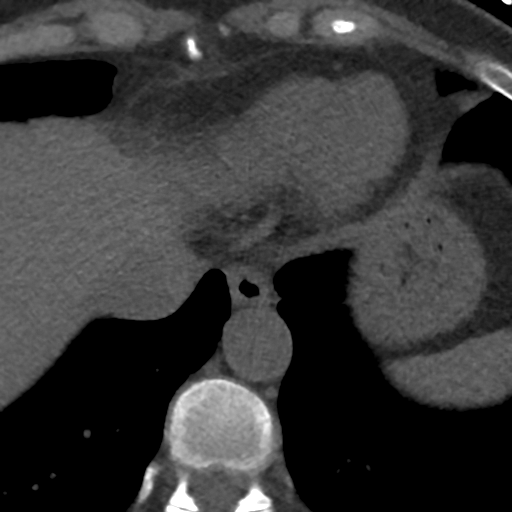
[im 6/49  lung]
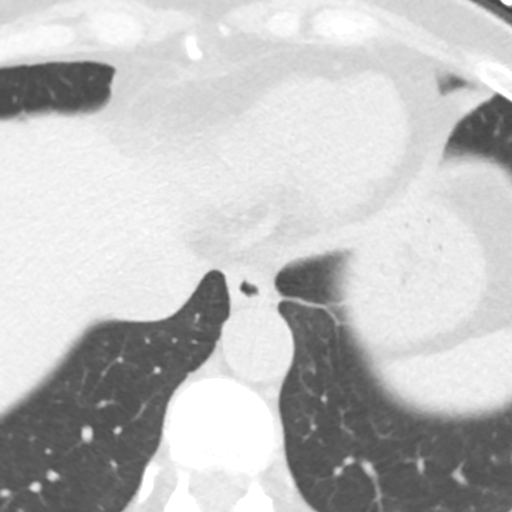
[im 11/49  vessel]
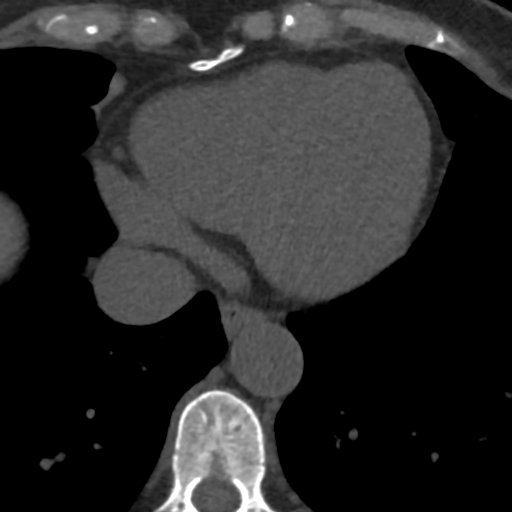
[im 17/49  vessel]
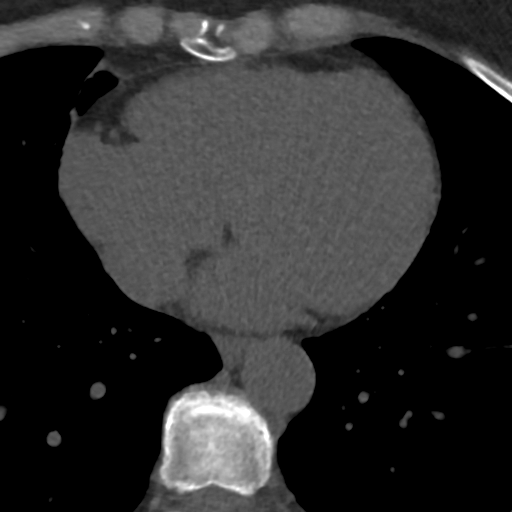
[im 22/49  vessel]
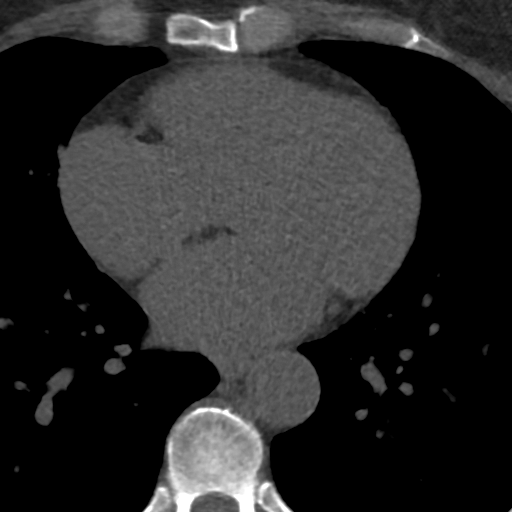
[im 27/49  vessel]
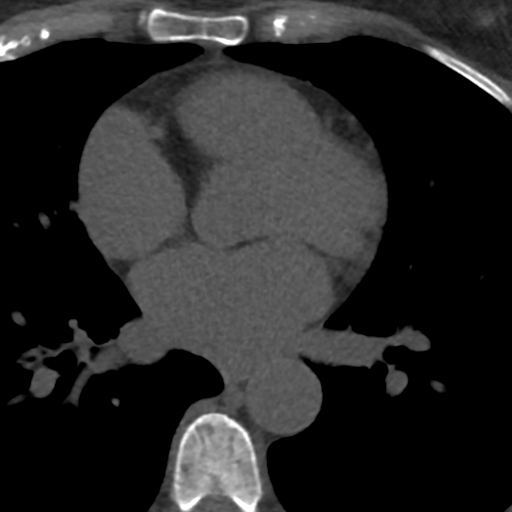
[im 27/49  lung]
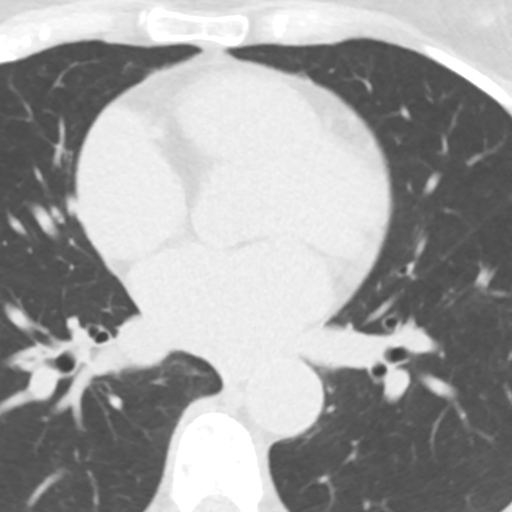
[im 33/49  vessel]
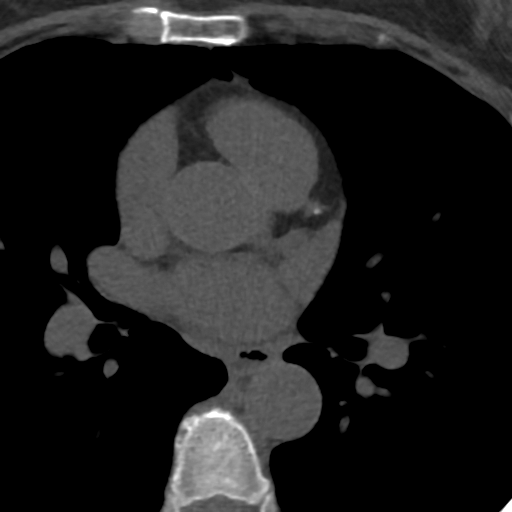
[im 38/49  vessel]
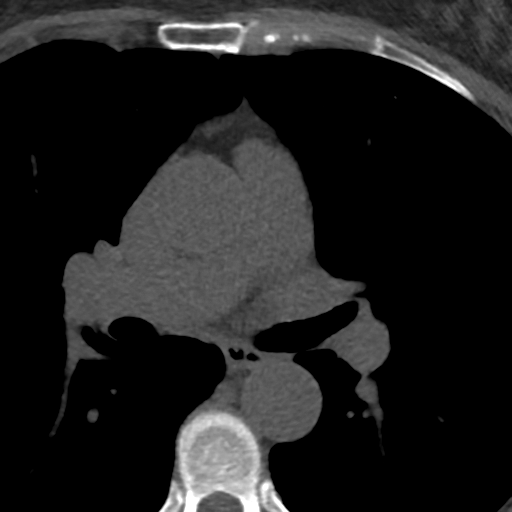
[im 43/49  vessel]
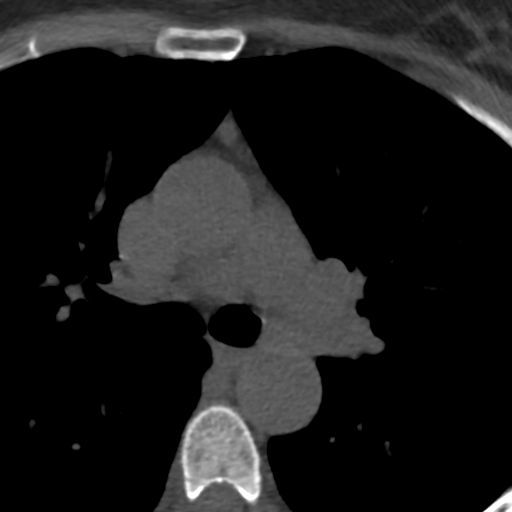

[Series 4: lung st 68 % · axial · 0.64mm/px · z∈[-232,-121]mm · 8 of 49 slices shown]
[im 6/49  lung]
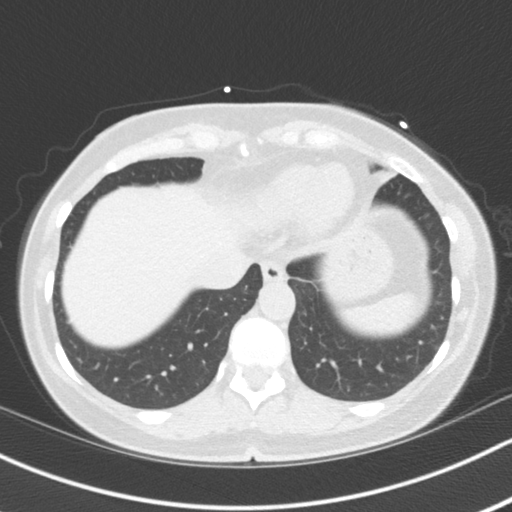
[im 11/49  lung]
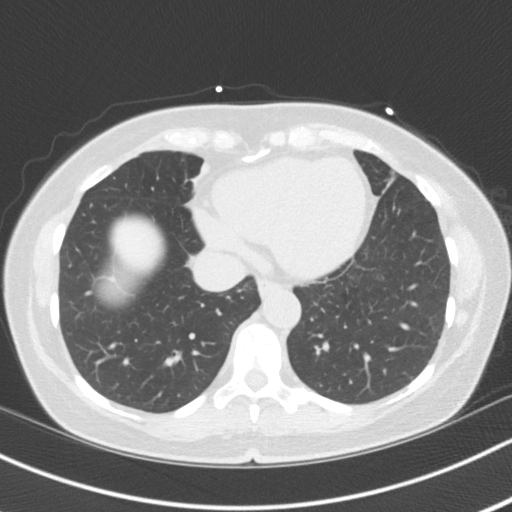
[im 17/49  lung]
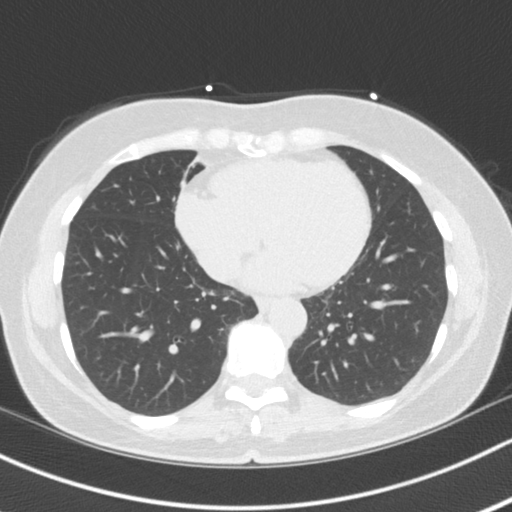
[im 22/49  lung]
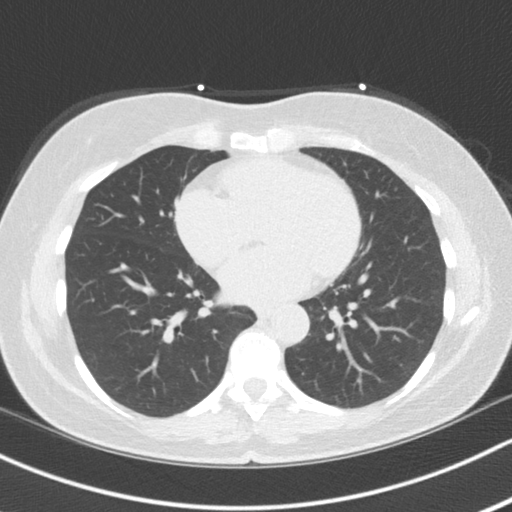
[im 27/49  lung]
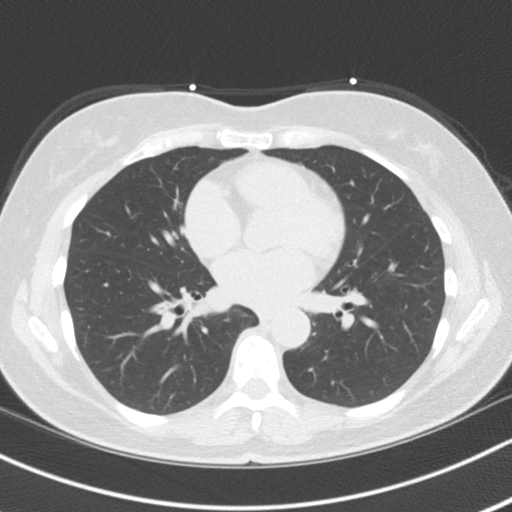
[im 33/49  lung]
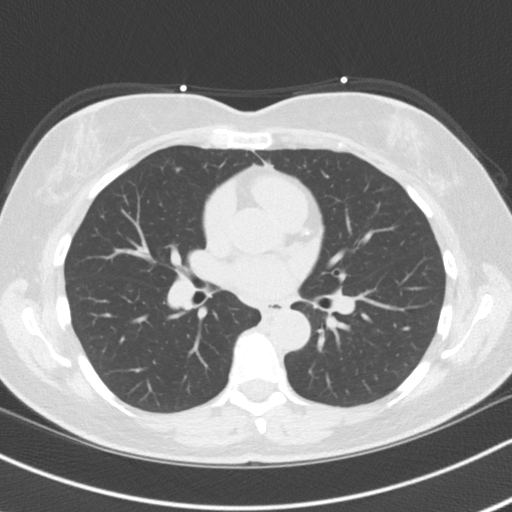
[im 38/49  lung]
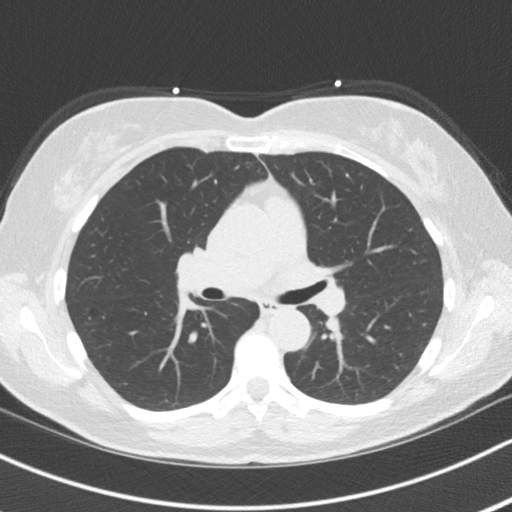
[im 43/49  lung]
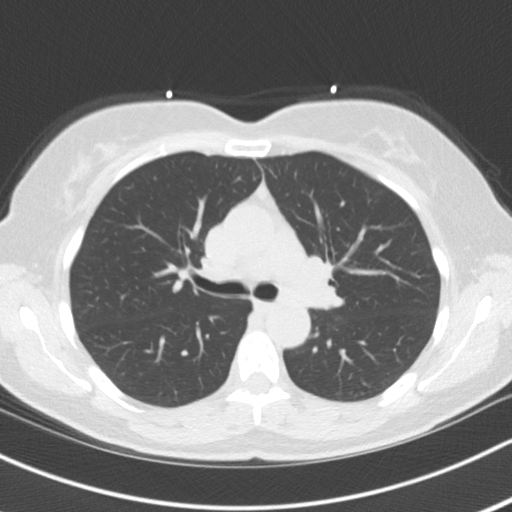

[16 of 20 positions shown; findings below may reference images not displayed]

FINDINGS: Vascular: Heart is normal size. Aorta is normal caliber.
Calcifications in the aortic arch.

Mediastinum/Nodes: No adenopathy.

Lungs/Pleura: Linear scarring in the anterior right middle lobe and
lingula. No acute confluent airspace opacities or effusions.

Upper Abdomen: Imaging into the upper abdomen shows no acute
findings.

Musculoskeletal: Chest wall soft tissues are unremarkable. No acute
bony abnormality.
IMPRESSION: Atherosclerotic calcifications in the aortic arch.

No acute extra cardiac abnormality.
FINDINGS: Non-cardiac: See separate report from [REDACTED].

Ascending aorta: Normal diameter 3.2 cm

Pericardium: Normal

Coronary arteries: Mild calcium noted in proximal and mid LAD
IMPRESSION: Coronary calcium score of 8. This was 61 st percentile for age and
sex matched control.

AUGUSTOWSKA

*** End of Addendum ***
EXAM:
OVER-READ INTERPRETATION  CT CHEST

The following report is an over-read performed by radiologist Dr.
AUGUSTOWSKA [REDACTED] on [DATE]. This over-read
does not include interpretation of cardiac or coronary anatomy or
pathology. The coronary calcium score interpretation by the
cardiologist is attached.
FINDINGS: Vascular: Heart is normal size. Aorta is normal caliber.
Calcifications in the aortic arch.

Mediastinum/Nodes: No adenopathy.

Lungs/Pleura: Linear scarring in the anterior right middle lobe and
lingula. No acute confluent airspace opacities or effusions.

Upper Abdomen: Imaging into the upper abdomen shows no acute
findings.

Musculoskeletal: Chest wall soft tissues are unremarkable. No acute
bony abnormality.
IMPRESSION: Atherosclerotic calcifications in the aortic arch.

No acute extra cardiac abnormality.

## 2018-12-02 ENCOUNTER — Telehealth: Payer: Self-pay | Admitting: *Deleted

## 2018-12-02 MED ORDER — PRAVASTATIN SODIUM 20 MG PO TABS: 20.0000 mg | ORAL_TABLET | Freq: Every evening | ORAL | 3 refills | Status: DC

## 2018-12-02 NOTE — Telephone Encounter (Signed)
Patient informed of CT calcium score results and advised to start pravastatin 20 mg daily. Patient is agreeable and verbalized understanding. Prescription has been sent to [facility] as requested. She reports a history of myalgias 20 years ago when she attempted to take a statin drug but is willing to try again as she cannot recall which statin it was. Patient will contact our office with any questions or concerns.

## 2018-12-02 NOTE — Telephone Encounter (Signed)
-----   Message from Richardo Priest, MD sent at 11/29/2018 11:51 AM EDT ----- Normal or stable result  Calcium score is relatively low 8, less than 10 is considered low but is not 0.  I would advise her that she should take a statin and I think she could take a low-dose of a low intensity like pravastatin 20 mg daily and if agreeable we could initiated.  I would not advise further evaluation like a cardiac CTA or heart catheterization at this time

## 2018-12-04 ENCOUNTER — Ambulatory Visit: Payer: No Typology Code available for payment source

## 2018-12-10 ENCOUNTER — Other Ambulatory Visit: Payer: Self-pay

## 2018-12-10 ENCOUNTER — Ambulatory Visit (INDEPENDENT_AMBULATORY_CARE_PROVIDER_SITE_OTHER): Payer: No Typology Code available for payment source | Admitting: *Deleted

## 2018-12-10 DIAGNOSIS — Z23 Encounter for immunization: Secondary | ICD-10-CM | POA: Diagnosis not present

## 2018-12-10 NOTE — Progress Notes (Signed)
Patient here for flu vaccine.    Flu vaccine given in right deltoid and patient tolerated well.

## 2019-02-28 DIAGNOSIS — C439 Malignant melanoma of skin, unspecified: Secondary | ICD-10-CM

## 2019-02-28 HISTORY — DX: Malignant melanoma of skin, unspecified: C43.9

## 2019-04-18 ENCOUNTER — Encounter: Payer: Self-pay | Admitting: Family

## 2019-04-18 DIAGNOSIS — H18413 Arcus senilis, bilateral: Secondary | ICD-10-CM | POA: Diagnosis not present

## 2019-04-22 ENCOUNTER — Ambulatory Visit (INDEPENDENT_AMBULATORY_CARE_PROVIDER_SITE_OTHER): Payer: Medicare Other | Admitting: Family

## 2019-04-22 ENCOUNTER — Encounter: Payer: Self-pay | Admitting: Family

## 2019-04-22 ENCOUNTER — Other Ambulatory Visit: Payer: Self-pay

## 2019-04-22 VITALS — BP 135/75 | HR 60 | Temp 98.5°F | Resp 16 | Ht 67.0 in | Wt 153.0 lb

## 2019-04-22 DIAGNOSIS — H04123 Dry eye syndrome of bilateral lacrimal glands: Secondary | ICD-10-CM | POA: Diagnosis not present

## 2019-04-22 DIAGNOSIS — R682 Dry mouth, unspecified: Secondary | ICD-10-CM

## 2019-04-22 DIAGNOSIS — L989 Disorder of the skin and subcutaneous tissue, unspecified: Secondary | ICD-10-CM

## 2019-04-22 DIAGNOSIS — M2550 Pain in unspecified joint: Secondary | ICD-10-CM | POA: Diagnosis not present

## 2019-04-22 LAB — SEDIMENTATION RATE: Sed Rate: 5 mm/hr (ref 0–30)

## 2019-04-22 NOTE — Patient Instructions (Signed)
Please complete lab work prior to leaving.   

## 2019-04-22 NOTE — Progress Notes (Signed)
Subjective:    Patient ID: Ariel [name], female    DOB: 10-31-1955, 64 y.o.   MRN: [id number]  HPI  Patient is a 64 yr old female who present today at the recommendation of her eye doctor for possible Sjogren's evaluation.   Reports + gum inflammation.  Saw her eye doctor due to some blurring in the left eye.  She reports some joint pain. Bilateral hand pain, wrist pain, fatigue, some shoulder pain.  Does have hx of dry eyes.  + dry mouth.  Reports that I thas been 1 year since she noticed it.    Skin lesion on right cheek.   Review of Systems See HPI  Past Medical History:  Diagnosis Date  . Chicken pox   . High cholesterol   . Urinary incontinence      Social History   Socioeconomic History  . Marital status: Married    Spouse name: Not on file  . Number of children: Not on file  . Years of education: Not on file  . Highest education level: Not on file  Occupational History  . Not on file  Tobacco Use  . Smoking status: Never Smoker  . Smokeless tobacco: Never Used  Substance and Sexual Activity  . Alcohol use: Never  . Drug use: Never  . Sexual activity: Not on file  Other Topics Concern  . Not on file  Social History Narrative   Lives with husband   One Son- lives locally, 1 grand-daughter   Daughter- [place]   Completed Hotel manager school   Works as a Probation officer.         Social Determinants of Radio broadcast assistant Strain:   . Difficulty of Paying Living Expenses: Not on file  Food Insecurity:   . Worried About Charity fundraiser in the Last Year: Not on file  . Ran Out of Food in the Last Year: Not on file  Transportation Needs:   . Lack of Transportation (Medical): Not on file  . Lack of Transportation (Non-Medical): Not on file  Physical Activity:   . Days of Exercise per Week: Not on file  . Minutes of Exercise per Session: Not on file  Stress:   . Feeling of Stress : Not on file  Social Connections:   . Frequency of  Communication with Friends and Family: Not on file  . Frequency of Social Gatherings with Friends and Family: Not on file  . Attends Religious Services: Not on file  . Active Member of Clubs or Organizations: Not on file  . Attends Archivist Meetings: Not on file  . Marital Status: Not on file  Intimate Partner Violence:   . Fear of Current or Ex-Partner: Not on file  . Emotionally Abused: Not on file  . Physically Abused: Not on file  . Sexually Abused: Not on file    Past Surgical History:  Procedure Laterality Date  . APPENDECTOMY  1962  . BLADDER SUSPENSION    . HERNIA REPAIR  2009  . MOUTH SURGERY  11/2015   gum surgery and bone implant    Family History  Problem Relation Age of Onset  . Hyperlipidemia Other        died 61- "natural causes"  . Coronary artery disease Father        died at 2  . COPD Father   . Alcohol abuse Father   . Diabetes Mellitus II Father   . Hyperlipidemia Other  4 siblings   . Hypertension Other        3 siblings  . Coronary artery disease Sister   . Heart attack Brother     Allergies  Allergen Reactions  . Codeine Nausea Only    Current Outpatient Medications on File Prior to Visit  Medication Sig Dispense Refill  . Multiple Vitamin (MULTIVITAMIN) tablet Take 1 tablet by mouth daily.    . Omega-3 Fatty Acids (FISH OIL) 500 MG CAPS Take 1 capsule by mouth daily.    . pravastatin (PRAVACHOL) 20 MG tablet Take 1 tablet (20 mg total) by mouth every evening. 30 tablet 3   No current facility-administered medications on file prior to visit.    BP 135/75 (BP Location: Left Arm, Patient Position: Sitting, Cuff Size: Small)   Pulse 60   Temp 98.5 F (36.9 C) (Oral)   Resp 16   Ht 5' 7"  (1.702 m)   Wt 153 lb (69.4 kg)   SpO2 100%   BMI 23.96 kg/m        Objective:   Physical Exam Constitutional:      Appearance: She is well-developed.  Cardiovascular:     Rate and Rhythm: Normal rate and regular rhythm.      Heart sounds: Normal heart sounds. No murmur.  Pulmonary:     Effort: Pulmonary effort is normal. No respiratory distress.     Breath sounds: Normal breath sounds. No wheezing.  Musculoskeletal:        General: No swelling.     Comments: No joint swelling  Skin:    Comments: Suspicious looking raised lesion noted right cheek  Psychiatric:        Behavior: Behavior normal.        Thought Content: Thought content normal.        Judgment: Judgment normal.           Assessment & Plan:  Arthralgia/dry mouth/dry eyes- obtain Rheumatoid factor, ANA, ESR, Sjogren's testing. If abnormal plan referral to rheumatology.   Skin lesion- refer to dermatology- will need excision.

## 2019-04-23 ENCOUNTER — Encounter: Payer: Self-pay | Admitting: Family

## 2019-04-23 LAB — ANA: Anti Nuclear Antibody (ANA): NEGATIVE

## 2019-04-23 LAB — RHEUMATOID FACTOR: Rheumatoid fact SerPl-aCnc: <14 IU/mL (ref <14)

## 2019-04-23 LAB — SJOGRENS SYNDROME-A EXTRACTABLE NUCLEAR ANTIBODY: SSA (Ro) (ENA) Antibody, IgG: <1 AI

## 2019-04-23 LAB — SJOGRENS SYNDROME-B EXTRACTABLE NUCLEAR ANTIBODY: SSB (La) (ENA) Antibody, IgG: <1 AI

## 2019-04-24 ENCOUNTER — Encounter: Payer: Self-pay | Admitting: Family

## 2019-05-20 DIAGNOSIS — L719 Rosacea, unspecified: Secondary | ICD-10-CM | POA: Diagnosis not present

## 2019-05-20 DIAGNOSIS — L0889 Other specified local infections of the skin and subcutaneous tissue: Secondary | ICD-10-CM | POA: Diagnosis not present

## 2019-07-15 ENCOUNTER — Other Ambulatory Visit (HOSPITAL_BASED_OUTPATIENT_CLINIC_OR_DEPARTMENT_OTHER): Payer: Self-pay | Admitting: Family

## 2019-07-15 DIAGNOSIS — Z1231 Encounter for screening mammogram for malignant neoplasm of breast: Secondary | ICD-10-CM

## 2019-07-17 DIAGNOSIS — H43813 Vitreous degeneration, bilateral: Secondary | ICD-10-CM | POA: Diagnosis not present

## 2019-07-31 DIAGNOSIS — L719 Rosacea, unspecified: Secondary | ICD-10-CM | POA: Diagnosis not present

## 2019-07-31 DIAGNOSIS — C4359 Malignant melanoma of other part of trunk: Secondary | ICD-10-CM | POA: Diagnosis not present

## 2019-07-31 DIAGNOSIS — C4371 Malignant melanoma of right lower limb, including hip: Secondary | ICD-10-CM | POA: Diagnosis not present

## 2019-07-31 DIAGNOSIS — L730 Acne keloid: Secondary | ICD-10-CM | POA: Diagnosis not present

## 2019-07-31 DIAGNOSIS — D485 Neoplasm of uncertain behavior of skin: Secondary | ICD-10-CM | POA: Diagnosis not present

## 2019-08-04 ENCOUNTER — Other Ambulatory Visit: Payer: Self-pay

## 2019-08-04 ENCOUNTER — Ambulatory Visit (HOSPITAL_BASED_OUTPATIENT_CLINIC_OR_DEPARTMENT_OTHER)
Admission: RE | Admit: 2019-08-04 | Discharge: 2019-08-04 | Disposition: A | Discharge status: Home / Self Care | Payer: Medicare Other | Source: Ambulatory Visit | Attending: Family | Admitting: Family

## 2019-08-04 DIAGNOSIS — Z1231 Encounter for screening mammogram for malignant neoplasm of breast: Secondary | ICD-10-CM | POA: Insufficient documentation

## 2019-08-04 IMAGING — MG DIGITAL SCREENING BILAT W/ TOMO W/ CAD
8 series · 8 of 24 positions shown · non-contrast
Comparison: Previous exam(s).

CLINICAL DATA: Screening.

EXAM:
DIGITAL SCREENING BILATERAL MAMMOGRAM WITH TOMO AND CAD

[R CC synth-2D]
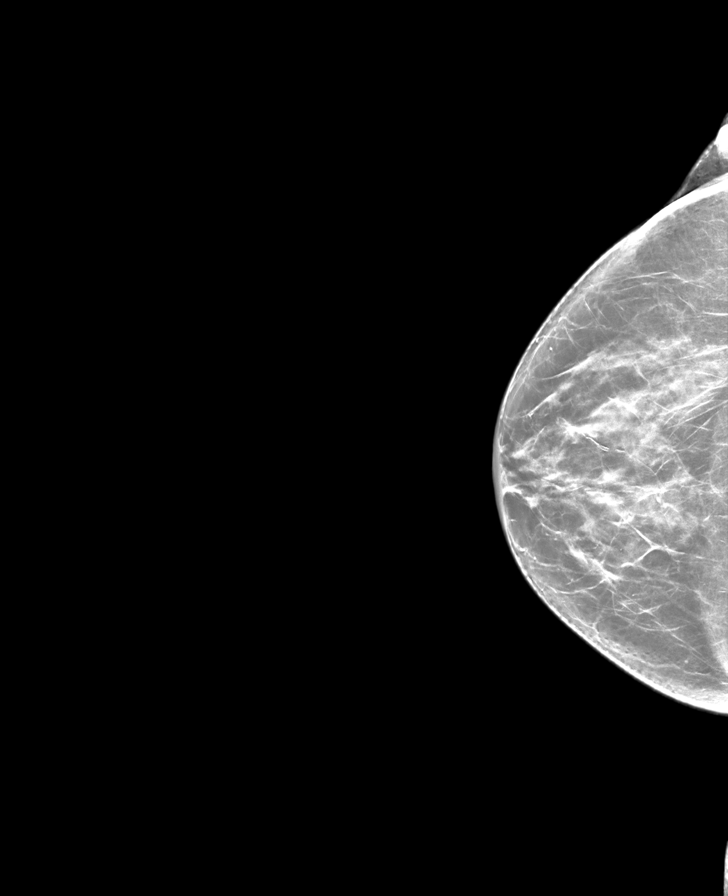

[L MLO synth-2D]
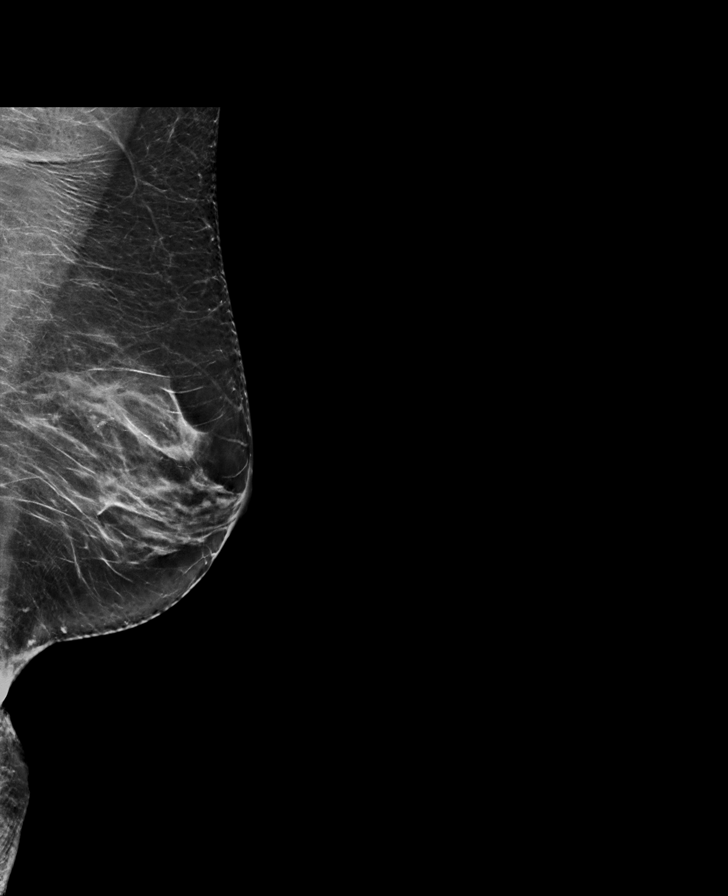

[R MLO synth-2D]
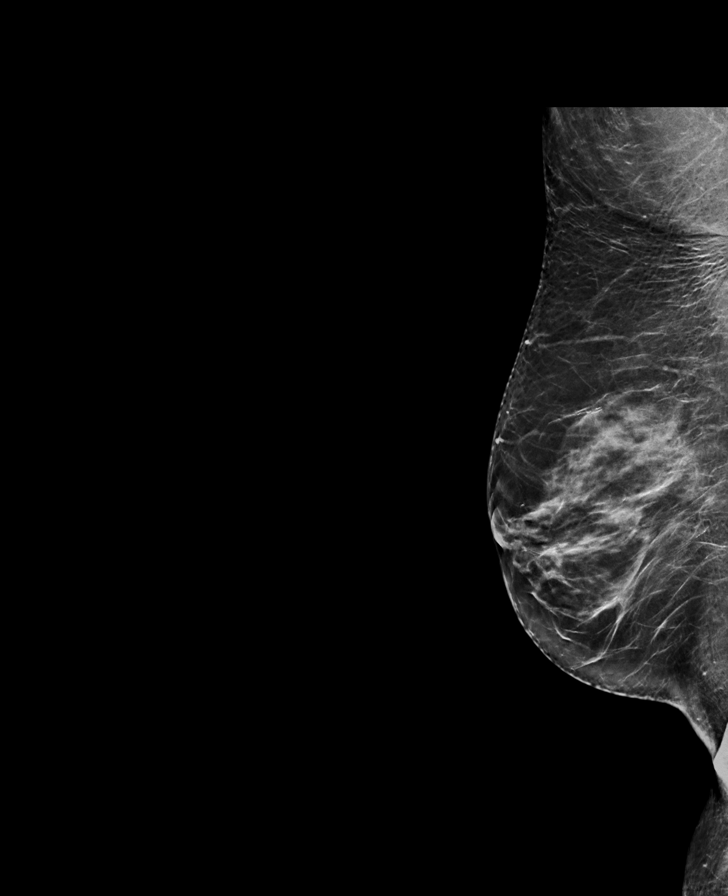

[L CC synth-2D]
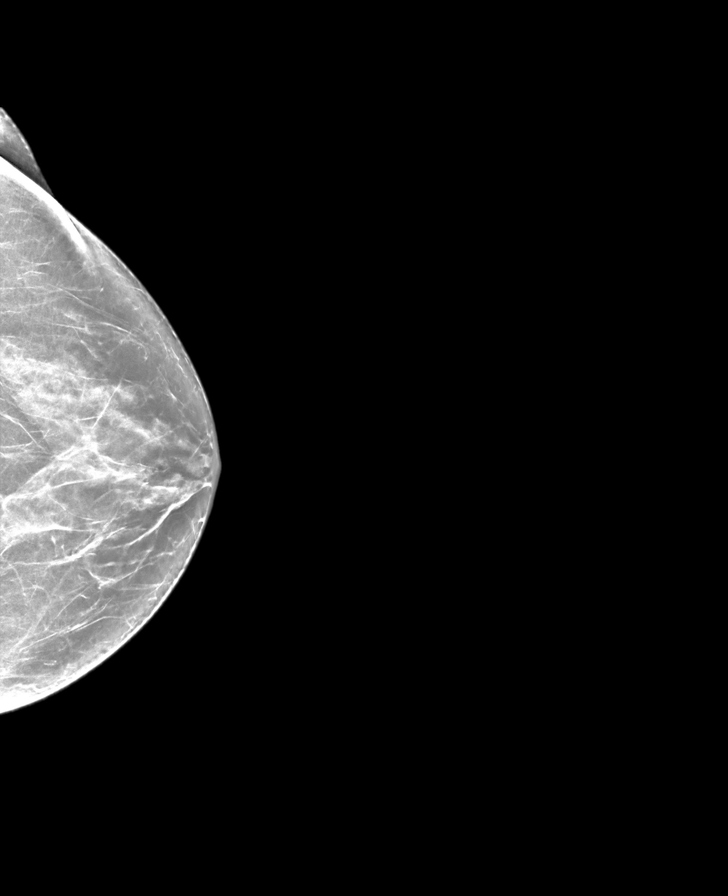

[R CC tomo · tomo slice 34/67.0]
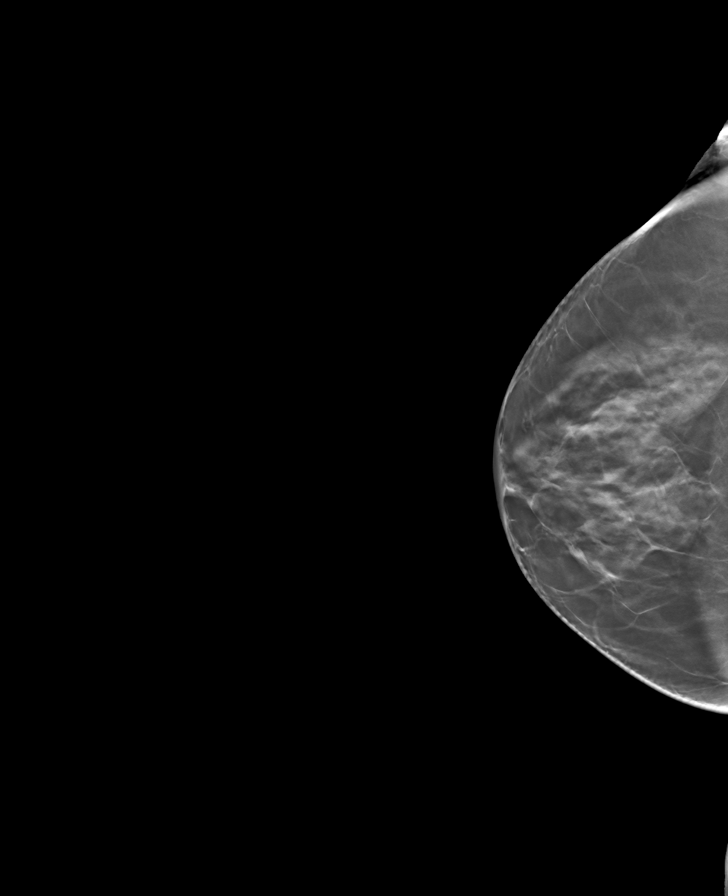

[R MLO tomo · tomo slice 35/69.0]
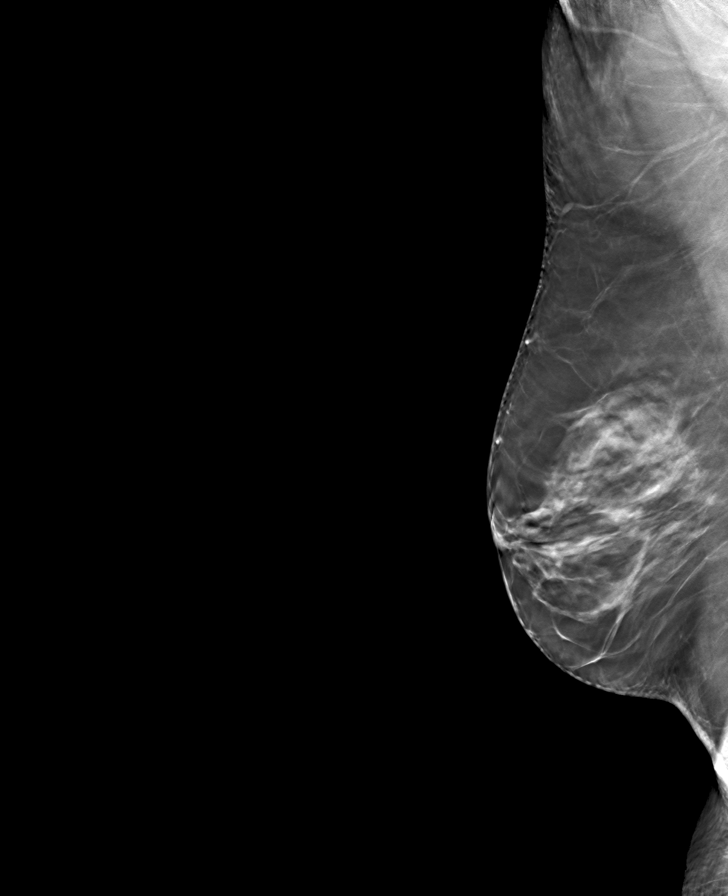

[L CC tomo · tomo slice 35/69.0]
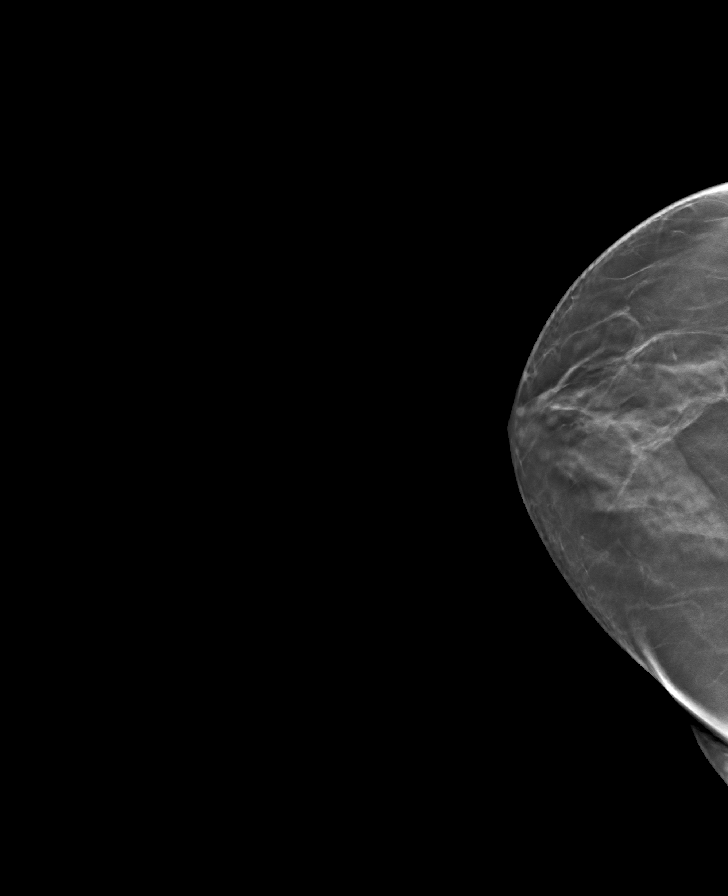

[L MLO tomo · tomo slice 37/72.0]
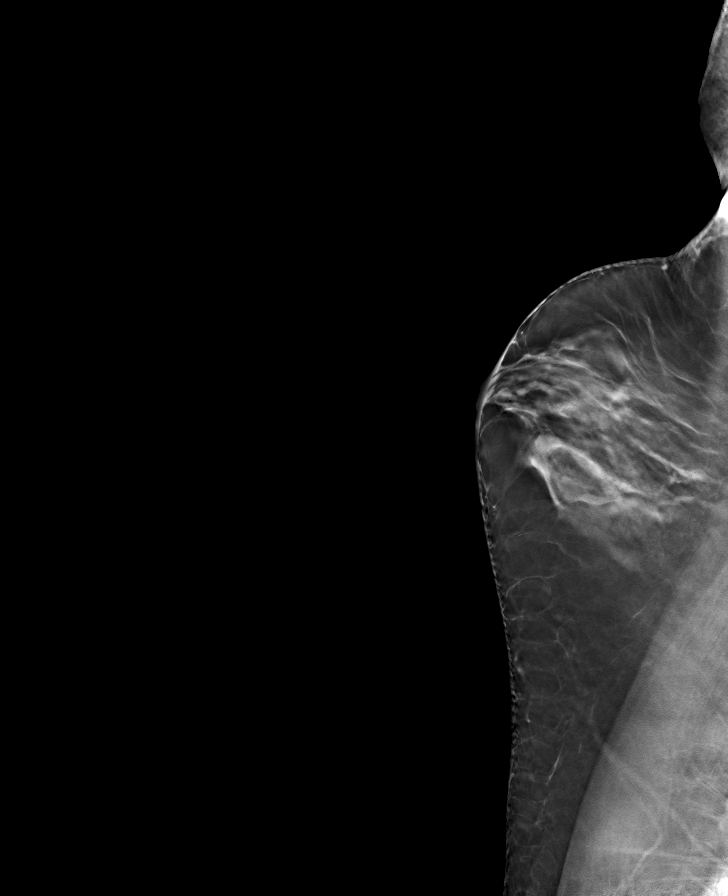

[8 of 24 positions shown; findings below may reference images not displayed]

ACR Breast Density Category c: The breast tissue is heterogeneously
dense, which may obscure small masses.
FINDINGS: There are no findings suspicious for malignancy. Images were
processed with CAD.
IMPRESSION: No mammographic evidence of malignancy. A result letter of this
screening mammogram will be mailed directly to the patient.

RECOMMENDATION:
Screening mammogram in one year. (Code:[5V])

BI-RADS CATEGORY  1: Negative.

## 2019-08-06 ENCOUNTER — Encounter: Payer: Self-pay | Admitting: Family

## 2019-08-09 ENCOUNTER — Encounter: Payer: Self-pay | Admitting: Family

## 2019-08-28 DIAGNOSIS — C4372 Malignant melanoma of left lower limb, including hip: Secondary | ICD-10-CM | POA: Diagnosis not present

## 2019-09-15 ENCOUNTER — Other Ambulatory Visit: Payer: Self-pay | Admitting: General Surgery

## 2019-09-15 DIAGNOSIS — C4371 Malignant melanoma of right lower limb, including hip: Secondary | ICD-10-CM | POA: Diagnosis not present

## 2019-09-24 DIAGNOSIS — L570 Actinic keratosis: Secondary | ICD-10-CM | POA: Diagnosis not present

## 2019-09-24 DIAGNOSIS — L821 Other seborrheic keratosis: Secondary | ICD-10-CM | POA: Diagnosis not present

## 2019-09-24 DIAGNOSIS — C4371 Malignant melanoma of right lower limb, including hip: Secondary | ICD-10-CM | POA: Diagnosis not present

## 2019-09-24 DIAGNOSIS — L814 Other melanin hyperpigmentation: Secondary | ICD-10-CM | POA: Diagnosis not present

## 2019-09-24 DIAGNOSIS — L578 Other skin changes due to chronic exposure to nonionizing radiation: Secondary | ICD-10-CM | POA: Diagnosis not present

## 2019-09-29 NOTE — Progress Notes (Signed)
Your procedure is scheduled on Thursday, August 12th.  Report to [facility] Main Entrance "A" at 5:30 A.M., and check in at the Admitting office.  Call this number if you have problems the morning of surgery:  [phone]  Call [phone] if you have any questions prior to your surgery date Monday-Friday 8am-4pm   Remember:  Do not eat after midnight the night before your surgery  You may drink clear liquids until 4:30 A.M. the morning of your surgery.   Clear liquids allowed are: Water, Non-Citrus Juices (without pulp), Carbonated Beverages, Clear Tea, Black Coffee Only, and Gatorade  Please complete your PRE-SURGERY ENSURE that was provided to you by 4:30 A.M. the morning of surgery  Please, if able, drink it in one setting. DO NOT SIP.    Take these medicines the morning of surgery with A SIP OF WATER   If needed - acetaminophen (TYLENOL)   As of today, STOP taking any Aspirin (unless otherwise instructed by your surgeon) Aleve, Naproxen, Ibuprofen, Motrin, Advil, Goody's, BC's, all herbal medications, fish oil, and all vitamins.                     Do not wear jewelry, make up, or nail polish            Do not wear lotions, powders, perfumes, or deodorant.            Do not shave 48 hours prior to surgery.              Do not bring valuables to the hospital.            [facility] is not responsible for any belongings or valuables.  Do NOT Smoke (Tobacco/Vaping) or drink Alcohol 24 hours prior to your procedure If you use a CPAP at night, you may bring all equipment for your overnight stay.   Contacts, glasses, dentures or bridgework may not be worn into surgery.      For patients admitted to the hospital, discharge time will be determined by your treatment team.   Patients discharged the day of surgery will not be allowed to drive home, and someone needs to stay with them for 24 hours.  Special instructions:   - Preparing For Surgery  Before surgery, you can  play an important role. Because skin is not sterile, your skin needs to be as free of germs as possible. You can reduce the number of germs on your skin by washing with CHG (chlorahexidine gluconate) Soap before surgery.  CHG is an antiseptic cleaner which kills germs and bonds with the skin to continue killing germs even after washing.    Oral Hygiene is also important to reduce your risk of infection.  Remember - BRUSH YOUR TEETH THE MORNING OF SURGERY WITH YOUR REGULAR TOOTHPASTE  Please do not use if you have an allergy to CHG or antibacterial soaps. If your skin becomes reddened/irritated stop using the CHG.  Do not shave (including legs and underarms) for at least 48 hours prior to first CHG shower. It is OK to shave your face.  Please follow these instructions carefully.   1. Shower the NIGHT BEFORE SURGERY and the MORNING OF SURGERY with CHG Soap.   2. If you chose to wash your hair, wash your hair first as usual with your normal shampoo.  3. After you shampoo, rinse your hair and body thoroughly to remove the shampoo.  4. Use CHG as you would any other  liquid soap. You can apply CHG directly to the skin and wash gently with a scrungie or a clean washcloth.   5. Apply the CHG Soap to your body ONLY FROM THE NECK DOWN.  Do not use on open wounds or open sores. Avoid contact with your eyes, ears, mouth and genitals (private parts). Wash Face and genitals (private parts)  with your normal soap.   6. Wash thoroughly, paying special attention to the area where your surgery will be performed.  7. Thoroughly rinse your body with warm water from the neck down.  8. DO NOT shower/wash with your normal soap after using and rinsing off the CHG Soap.  9. Pat yourself dry with a CLEAN TOWEL.  10. Wear CLEAN PAJAMAS to bed the night before surgery  11. Place CLEAN SHEETS on your bed the night of your first shower and DO NOT SLEEP WITH PETS.  Day of Surgery: Wear Clean/Comfortable clothing  the morning of surgery Do not apply any deodorants/lotions.   Remember to brush your teeth WITH YOUR REGULAR TOOTHPASTE.   Please read over the following fact sheets that you were given.

## 2019-09-30 ENCOUNTER — Encounter (HOSPITAL_COMMUNITY)
Admission: RE | Admit: 2019-09-30 | Discharge: 2019-09-30 | Disposition: A | Discharge status: Home / Self Care | Payer: Medicare Other | Source: Ambulatory Visit | Attending: General Surgery | Admitting: General Surgery

## 2019-09-30 ENCOUNTER — Encounter (HOSPITAL_COMMUNITY): Payer: Self-pay

## 2019-09-30 ENCOUNTER — Other Ambulatory Visit: Payer: Self-pay

## 2019-09-30 DIAGNOSIS — Z01812 Encounter for preprocedural laboratory examination: Secondary | ICD-10-CM | POA: Diagnosis not present

## 2019-09-30 HISTORY — DX: Nausea with vomiting, unspecified: R11.2

## 2019-09-30 HISTORY — DX: Other complications of anesthesia, initial encounter: T88.59XA

## 2019-09-30 HISTORY — DX: Other specified postprocedural states: Z98.890

## 2019-09-30 LAB — CBC WITH DIFFERENTIAL/PLATELET
Abs Immature Granulocytes: 0.01 10*3/uL (ref 0.00–0.07)
Basophils Absolute: 0 10*3/uL (ref 0.0–0.1)
Basophils Relative: 1 %
Eosinophils Absolute: 0.1 10*3/uL (ref 0.0–0.5)
Eosinophils Relative: 1 %
HCT: 40.9 % (ref 36.0–46.0)
Hemoglobin: 13.2 g/dL (ref 12.0–15.0)
Immature Granulocytes: 0 %
Lymphocytes Relative: 35 %
Lymphs Abs: 2.2 10*3/uL (ref 0.7–4.0)
MCH: 28.4 pg (ref 26.0–34.0)
MCHC: 32.3 g/dL (ref 30.0–36.0)
MCV: 88.1 fL (ref 80.0–100.0)
Monocytes Absolute: 0.4 10*3/uL (ref 0.1–1.0)
Monocytes Relative: 7 %
Neutro Abs: 3.7 10*3/uL (ref 1.7–7.7)
Neutrophils Relative %: 56 %
Platelets: 288 10*3/uL (ref 150–400)
RBC: 4.64 MIL/uL (ref 3.87–5.11)
RDW: 12.6 % (ref 11.5–15.5)
WBC: 6.5 10*3/uL (ref 4.0–10.5)
nRBC: 0 % (ref 0.0–0.2)

## 2019-09-30 LAB — COMPREHENSIVE METABOLIC PANEL
ALT: 22 U/L (ref 0–44)
AST: 28 U/L (ref 15–41)
Albumin: 4.1 g/dL (ref 3.5–5.0)
Alkaline Phosphatase: 54 U/L (ref 38–126)
Anion gap: 7 (ref 5–15)
BUN: 8 mg/dL (ref 8–23)
CO2: 30 mmol/L (ref 22–32)
Calcium: 9.9 mg/dL (ref 8.9–10.3)
Chloride: 102 mmol/L (ref 98–111)
Creatinine, Ser: 0.63 mg/dL (ref 0.44–1.00)
GFR calc Af Amer: >60 mL/min (ref >60)
GFR calc non Af Amer: >60 mL/min (ref >60)
Glucose, Bld: 98 mg/dL (ref 70–99)
Potassium: 4.1 mmol/L (ref 3.5–5.1)
Sodium: 139 mmol/L (ref 135–145)
Total Bilirubin: 0.4 mg/dL (ref 0.3–1.2)
Total Protein: 7.4 g/dL (ref 6.5–8.1)

## 2019-09-30 NOTE — Progress Notes (Signed)
PCP - [name], NP Cardiologist - denies (Saw Dr. Bettina Gavia once but no follow-up was needed per patient; see cardiologist's note)  PPM/ICD - denies  Chest x-ray - N/A EKG - N/A Stress Test - denies ECHO - denies Cardiac Cath - denies  Sleep Study - denies CPAP - N/A  DM: denies  Blood Thinner Instructions: N/A Aspirin Instructions: N/A  ERAS Protcol - Yes PRE-SURGERY Ensure or G2- Ensure given  COVID TEST- Scheduled for 10/06/2019. Patient verbalized understanding of self-quarantine instructions, appointment time and place.  Anesthesia review: YES, anesthesia question/follow-up   Patient denies shortness of breath, fever, cough and chest pain at PAT appointment  All instructions explained to the patient, with a verbal understanding of the material. Patient agrees to go over the instructions while at home for a better understanding. Patient also instructed to self quarantine after being tested for COVID-19. The opportunity to ask questions was provided.

## 2019-10-01 NOTE — Anesthesia Preprocedure Evaluation (Addendum)
Anesthesia Evaluation  Patient identified by MRN, date of birth, ID band Patient awake    Reviewed: Allergy & Precautions, NPO status , Patient's Chart, lab work & pertinent test results  History of Anesthesia Complications (+) PONV  Airway Mallampati: I  TM Distance: >3 FB Neck ROM: Full    Dental no notable dental hx.    Pulmonary neg pulmonary ROS,    Pulmonary exam normal breath sounds clear to auscultation       Cardiovascular Exercise Tolerance: Good negative cardio ROS Normal cardiovascular exam Rhythm:Regular Rate:Normal     Neuro/Psych negative neurological ROS  negative psych ROS   GI/Hepatic negative GI ROS, Neg liver ROS,   Endo/Other  Hyperthyroidism   Renal/GU negative Renal ROS  negative genitourinary   Musculoskeletal negative musculoskeletal ROS (+)   Abdominal Normal abdominal exam  (+)   Peds  Hematology negative hematology ROS (+)   Anesthesia Other Findings melanoma  Reproductive/Obstetrics negative OB ROS                            Anesthesia Physical Anesthesia Plan  ASA: II  Anesthesia Plan: General   Post-op Pain Management:    Induction: Intravenous  PONV Risk Score and Plan: 3 and Ondansetron, Dexamethasone, Midazolam, Scopolamine patch - Pre-op, Promethazine and Propofol infusion  Airway Management Planned: Oral ETT  Additional Equipment:   Intra-op Plan:   Post-operative Plan: Extubation in OR  Informed Consent: I have reviewed the patients History and Physical, chart, labs and discussed the procedure including the risks, benefits and alternatives for the proposed anesthesia with the patient or authorized representative who has indicated his/her understanding and acceptance.     Dental advisory given  Plan Discussed with: CRNA  Anesthesia Plan Comments: (PAT note by Karoline Caldwell, PA-C: Last year pt had one episode of shortness of breath  with exertion. Was walking uphill with tight cloth mask on and felt short of breath at top of hill. Lasted a few minutes was relieved by rest. No chest pain. Single episode, no recurrence. She was referred to cardiology by PCP and was seen by Dr. Bettina Gavia who ordered coronary calcium scan which was done 11/27/18. Dr. Bettina Gavia commented on result stating, "Calcium score is relatively low 8, less than 10 is considered low but is not 0. I would advise her that she should take a statin and I think she could take a low-dose of a low intensity like pravastatin 20 mg daily and if agreeable we could initiated. I would not advise further evaluation like a cardiac CTA or heart catheterization at this time." She was advised to follow up as needed.   Pt reports she has not had any additional episodes of SOB. Is active without symptoms.   Patient reports history of prolonged emergency and requests case be done under spinal anesthesia if possible.  Preop labs reviewed, unremarkable.  EKG 10/01/2018: Sinus rhythm.  Rate 72.  Coronary calcium score 11/27/2018: FINDINGS: Non-cardiac: See separate report from [facility].  Ascending aorta: Normal diameter 3.2 cm  Pericardium: Normal  Coronary arteries: Mild calcium noted in proximal and mid LAD  IMPRESSION: Coronary calcium score of 8. This was 46 st percentile for age and sex matched control.  )      Anesthesia Quick Evaluation

## 2019-10-01 NOTE — Progress Notes (Signed)
Anesthesia Chart Review:  Last year pt had one episode of shortness of breath with exertion. Was walking uphill with tight cloth mask on and felt short of breath at top of hill. Lasted a few minutes was relieved by rest. No chest pain. Single episode, no recurrence. She was referred to cardiology by PCP and was seen by Dr. Bettina Gavia who ordered coronary calcium scan which was done 11/27/18. Dr. Bettina Gavia commented on result stating, "Calcium score is relatively low 8, less than 10 is considered low but is not 0. I would advise her that she should take a statin and I think she could take a low-dose of a low intensity like pravastatin 20 mg daily and if agreeable we could initiated. I would not advise further evaluation like a cardiac CTA or heart catheterization at this time." She was advised to follow up as needed.   Pt reports she has not had any additional episodes of SOB. Is active without symptoms.   Patient reports history of prolonged emergency and requests case be done under spinal anesthesia if possible.  Preop labs reviewed, unremarkable.  EKG 10/01/2018: Sinus rhythm.  Rate 72.  Coronary calcium score 11/27/2018: FINDINGS: Non-cardiac: See separate report from [facility].  Ascending aorta: Normal diameter 3.2 cm  Pericardium: Normal  Coronary arteries: Mild calcium noted in proximal and mid LAD  IMPRESSION: Coronary calcium score of 8. This was 2 st percentile for age and sex matched control.   Wynonia Musty [facility] Short Stay Center/Anesthesiology Phone [phone] 10/01/2019 3:16 PM

## 2019-10-06 ENCOUNTER — Other Ambulatory Visit (HOSPITAL_COMMUNITY)
Admission: RE | Admit: 2019-10-06 | Discharge: 2019-10-06 | Disposition: A | Discharge status: Home / Self Care | Payer: Medicare Other | Source: Ambulatory Visit | Attending: General Surgery | Admitting: General Surgery

## 2019-10-06 DIAGNOSIS — Z01812 Encounter for preprocedural laboratory examination: Secondary | ICD-10-CM | POA: Insufficient documentation

## 2019-10-06 DIAGNOSIS — Z20822 Contact with and (suspected) exposure to covid-19: Secondary | ICD-10-CM | POA: Insufficient documentation

## 2019-10-06 LAB — SARS CORONAVIRUS 2 (TAT 6-24 HRS): SARS Coronavirus 2: NEGATIVE

## 2019-10-08 NOTE — H&P (Signed)
ITHZEL FEDORCHAK Appointment: 09/15/2019 10:00 AM Location: [place] Office Patient #: [id number] DOB: 09-12-1955 Widowed / Language: Cleophus Molt / Race: White Female   History of Present Illness Stark Klein MD; 09/15/2019 10:28 AM) The patient is a 64 year old female who presents with malignant melanoma. Patient is a lovely 64 yo F referred for malignant melanoma right hip diagnosed 07/2019. She had a mole on her right hip for many years. About the beginning of 2021, she noted that it was a little sore. Then it started to change colors. 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 She subsequently had a shave biopsy showing a 0.6 mm melanoma with positive deep and peripheral margins. She is otherwise well. She went to the skin surgery center but was deferred due to the possibility that she might need a lymph node biopsy. She has no other cancer history. Of note, she has a history of an open appendectomy and a repair of a hernia in that incision. She has no family history of cancer of which she is aware.   [facility] from [facility] [place] accession number [id number]  Breslow thickness 0.6 mm ulceration absent mitotic index 1 per mm2 peripheral margins involved deep margin involved regression absent microsatellitosis absent LVI absent perineural invasion absent pT1aNxMx   Past Surgical History (April Staton, CMA; 09/15/2019 9:46 AM) Appendectomy  Open Inguinal Hernia Surgery  Right. Oral Surgery   Diagnostic Studies History (April Staton, [place]; 09/15/2019 9:46 AM) Colonoscopy  5-10 years ago Mammogram  within last year  Allergies (April Staton, CMA; 09/15/2019 9:48 AM) Codeine and Related   Medication History (April Staton, CMA; 09/15/2019 9:48 AM) Doxycycline Hyclate (100MG  Capsule, Oral) Active. Medications Reconciled  Social History (April Staton, CMA; 09/15/2019 9:46 AM) Caffeine  use  Coffee. No alcohol use  No drug use  Tobacco use  Never smoker.  Family History (April Staton, [place]; 09/15/2019 9:46 AM) Alcohol Abuse  Father. Heart Disease  Father. Melanoma  Sister. Respiratory Condition  Father.  Pregnancy / Birth History (April Staton, [place]; 09/15/2019 9:46 AM) Age at menarche  50 years. Age of menopause  >59 Contraceptive History  Oral contraceptives. Gravida  2 Maternal age  66-25 Para  2  Other Problems (April Staton, [place]; 09/15/2019 9:46 AM) Bladder Problems  Melanoma  Other disease, cancer, significant illness     Review of Systems (April Staton CMA; 09/15/2019 9:46 AM) General Present- Fatigue. Not Present- Appetite Loss, Chills, Fever, Night Sweats, Weight Gain and Weight Loss. Skin Present- Change in Wart/Mole and Dryness. Not Present- Hives, Jaundice, New Lesions, Non-Healing Wounds, Rash and Ulcer. HEENT Present- Wears glasses/contact lenses. Not Present- Earache, Hearing Loss, Hoarseness, Nose Bleed, Oral Ulcers, Ringing in the Ears, Seasonal Allergies, Sinus Pain, Sore Throat, Visual Disturbances and Yellow Eyes. Respiratory Not Present- Bloody sputum, Chronic Cough, Difficulty Breathing, Snoring and Wheezing. Breast Not Present- Breast Mass, Breast Pain, Nipple Discharge and Skin Changes. Cardiovascular Not Present- Chest Pain, Difficulty Breathing Lying Down, Leg Cramps, Palpitations, Rapid Heart Rate, Shortness of Breath and Swelling of Extremities. Gastrointestinal Present- Constipation. Not Present- Abdominal Pain, Bloating, Bloody Stool, Change in Bowel Habits, Chronic diarrhea, Difficulty Swallowing, Excessive gas, Gets full quickly at meals, Hemorrhoids, Indigestion, Nausea, Rectal Pain and Vomiting. Female Genitourinary Present- Frequency and Urgency. Not Present- Nocturia, Painful Urination and Pelvic Pain. Musculoskeletal Present- Joint Stiffness. Not Present- Back Pain, Joint Pain, Muscle Pain, Muscle Weakness and  Swelling of  Extremities. Neurological Not Present- Decreased Memory, Fainting, Headaches, Numbness, Seizures, Tingling, Tremor, Trouble walking and Weakness. Psychiatric Not Present- Anxiety, Bipolar, Change in Sleep Pattern, Depression, Fearful and Frequent crying. Endocrine Not Present- Cold Intolerance, Excessive Hunger, Hair Changes, Heat Intolerance, Hot flashes and New Diabetes. Hematology Not Present- Blood Thinners, Easy Bruising, Excessive bleeding, Gland problems, HIV and Persistent Infections.  Vitals (April Staton CMA; 09/15/2019 9:49 AM) 09/15/2019 9:48 AM Weight: 147.13 lb Height: 67in Body Surface Area: 1.77 m Body Mass Index: 23.04 kg/m  Temp.: 98.25F (Temporal)  Pulse: 70 (Regular)  P.OX: 98% (Room air) BP: 138/78(Sitting, Left Arm, Standard)       Physical Exam Stark Klein MD; 09/15/2019 10:29 AM) General Mental Status-Alert. General Appearance-Consistent with stated age. Hydration-Well hydrated. Voice-Normal.  Integumentary Note: right hip has scar around 1.5 x 1 cm from shave biopsy. This is just medial to ASIS. no residual pigment.   Head and Neck Head-normocephalic, atraumatic with no lesions or palpable masses. Trachea-midline. Thyroid Gland Characteristics - normal size and consistency.  Eye Eyeball - Bilateral-Extraocular movements intact. Sclera/Conjunctiva - Bilateral-No scleral icterus.  Chest and Lung Exam Chest and lung exam reveals -quiet, even and easy respiratory effort with no use of accessory muscles and on auscultation, normal breath sounds, no adventitious sounds and normal vocal resonance. Inspection Chest Wall - Normal. Back - normal.  Cardiovascular Cardiovascular examination reveals -normal heart sounds, regular rate and rhythm with no murmurs and normal pedal pulses bilaterally.  Abdomen Inspection Inspection of the abdomen reveals - No Hernias. Palpation/Percussion Palpation and Percussion  of the abdomen reveal - Soft, Non Tender, No Rebound tenderness, No Rigidity (guarding) and No hepatosplenomegaly. Auscultation Auscultation of the abdomen reveals - Bowel sounds normal.  Neurologic Neurologic evaluation reveals -alert and oriented x 3 with no impairment of recent or remote memory. Mental Status-Normal.  Musculoskeletal Global Assessment -Note: no gross deformities.  Normal Exam - Left-Upper Extremity Strength Normal and Lower Extremity Strength Normal. Normal Exam - Right-Upper Extremity Strength Normal and Lower Extremity Strength Normal.  Lymphatic Head & Neck  General Head & Neck Lymphatics: Bilateral - Description - Normal. Axillary  General Axillary Region: Bilateral - Description - Normal. Tenderness - Non Tender. Femoral & Inguinal  Generalized Femoral & Inguinal Lymphatics: Bilateral - Description - No Generalized lymphadenopathy.    Assessment & Plan Stark Klein MD; 09/15/2019 10:32 AM) MALIGNANT MELANOMA OF HIP, RIGHT (C43.71) Impression: Pt has new dx of at least pT1a malignant melanoma of the right hip.  Due to the positive margins, I will plan a sentinel lymph node biopsy.  This may be close enough to the lesion that she might only need the incision extended a bit. I will plan this as soon as possible.  I reviewed risks of bleeding, infection, wound issues, seroma, numbness, recurrent melanoma.  She wishes to proceed. Current Plans You are being scheduled for surgery- Our schedulers will call you.  You should hear from our office's scheduling department within 5 working days about the location, date, and time of surgery. We try to make accommodations for patient's preferences in scheduling surgery, but sometimes the OR schedule or the surgeon's schedule prevents [place] from making those accommodations.  If you have not heard from our office [phone] in 5 working days, call the office and ask for your surgeon's nurse.  If you  have other questions about your diagnosis, plan, or surgery, call the office and ask for your surgeon's nurse.  Pt Education - Melanoma: skin cancer   Signed  electronically by Stark Klein, MD (09/15/2019 10:33 AM)

## 2019-10-09 ENCOUNTER — Encounter (HOSPITAL_COMMUNITY): Payer: Self-pay | Admitting: General Surgery

## 2019-10-09 ENCOUNTER — Encounter (HOSPITAL_COMMUNITY)
Admission: RE | Disposition: A | Discharge status: Home / Self Care | Payer: Self-pay | Source: Home / Self Care | Attending: General Surgery

## 2019-10-09 ENCOUNTER — Ambulatory Visit (HOSPITAL_COMMUNITY): Payer: Medicare Other | Admitting: Certified Registered Nurse Anesthetist

## 2019-10-09 ENCOUNTER — Other Ambulatory Visit: Payer: Self-pay

## 2019-10-09 ENCOUNTER — Ambulatory Visit (HOSPITAL_COMMUNITY)
Admission: RE | Admit: 2019-10-09 | Discharge: 2019-10-09 | Disposition: A | Discharge status: Home / Self Care | Payer: Medicare Other | Source: Ambulatory Visit | Attending: General Surgery | Admitting: General Surgery

## 2019-10-09 ENCOUNTER — Ambulatory Visit (HOSPITAL_COMMUNITY)
Admission: RE | Admit: 2019-10-09 | Discharge: 2019-10-09 | Disposition: A | Discharge status: Home / Self Care | Payer: Medicare Other | Attending: General Surgery | Admitting: General Surgery

## 2019-10-09 ENCOUNTER — Ambulatory Visit (HOSPITAL_COMMUNITY): Payer: Medicare Other | Admitting: Physician Assistant

## 2019-10-09 DIAGNOSIS — L089 Local infection of the skin and subcutaneous tissue, unspecified: Secondary | ICD-10-CM | POA: Diagnosis not present

## 2019-10-09 DIAGNOSIS — C4371 Malignant melanoma of right lower limb, including hip: Secondary | ICD-10-CM | POA: Diagnosis not present

## 2019-10-09 DIAGNOSIS — E7849 Other hyperlipidemia: Secondary | ICD-10-CM | POA: Diagnosis not present

## 2019-10-09 DIAGNOSIS — L905 Scar conditions and fibrosis of skin: Secondary | ICD-10-CM | POA: Diagnosis not present

## 2019-10-09 HISTORY — PX: EXCISION MELANOMA WITH SENTINEL LYMPH NODE BIOPSY: SHX5628

## 2019-10-09 SURGERY — EXCISION, MELANOMA, WITH SENTINEL LYMPH NODE BIOPSY
Anesthesia: General | Site: Hip | Laterality: Right

## 2019-10-09 MED ORDER — ONDANSETRON HCL 4 MG/2ML IJ SOLN
INTRAMUSCULAR | Status: DC | PRN
  Administered 2019-10-09: 4 mg via INTRAVENOUS

## 2019-10-09 MED ORDER — PHENYLEPHRINE HCL-NACL 10-0.9 MG/250ML-% IV SOLN
INTRAVENOUS | Status: DC | PRN
  Administered 2019-10-09: 20 ug/min via INTRAVENOUS

## 2019-10-09 MED ORDER — CEFAZOLIN SODIUM-DEXTROSE 2-4 GM/100ML-% IV SOLN
2.0000 g | INTRAVENOUS | Status: AC
  Administered 2019-10-09: 2 g via INTRAVENOUS

## 2019-10-09 MED ORDER — BUPIVACAINE HCL (PF) 0.25 % IJ SOLN
INTRAMUSCULAR | Status: AC
  Filled 2019-10-09: qty 30

## 2019-10-09 MED ORDER — TRAMADOL HCL 50 MG PO TABS: 50.0000 mg | ORAL_TABLET | Freq: Four times a day (QID) | ORAL | 0 refills | Status: DC | PRN

## 2019-10-09 MED ORDER — PROPOFOL 500 MG/50ML IV EMUL
INTRAVENOUS | Status: DC | PRN
  Administered 2019-10-09: 100 ug/kg/min via INTRAVENOUS

## 2019-10-09 MED ORDER — BUPIVACAINE HCL 0.25 % IJ SOLN
INTRAMUSCULAR | Status: DC | PRN
  Administered 2019-10-09: 20 mL

## 2019-10-09 MED ORDER — PROPOFOL 10 MG/ML IV BOLUS
INTRAVENOUS | Status: AC
  Filled 2019-10-09: qty 20

## 2019-10-09 MED ORDER — METHYLENE BLUE 1 % INJ SOLN
INTRAMUSCULAR | Status: DC | PRN
  Administered 2019-10-09: 1 mL

## 2019-10-09 MED ORDER — LIDOCAINE-EPINEPHRINE 1 %-1:100000 IJ SOLN
INTRAMUSCULAR | Status: AC
  Filled 2019-10-09: qty 1

## 2019-10-09 MED ORDER — ROCURONIUM BROMIDE 10 MG/ML (PF) SYRINGE
PREFILLED_SYRINGE | INTRAVENOUS | Status: DC | PRN
  Administered 2019-10-09: 60 mg via INTRAVENOUS
  Administered 2019-10-09: 20 mg via INTRAVENOUS

## 2019-10-09 MED ORDER — SCOPOLAMINE 1 MG/3DAYS TD PT72
MEDICATED_PATCH | TRANSDERMAL | Status: AC
  Administered 2019-10-09: 1.5 mg via TRANSDERMAL
  Filled 2019-10-09: qty 1

## 2019-10-09 MED ORDER — HYDROMORPHONE HCL 1 MG/ML IJ SOLN: 0.2500 mg | INTRAMUSCULAR | Status: DC | PRN

## 2019-10-09 MED ORDER — SUGAMMADEX SODIUM 200 MG/2ML IV SOLN
INTRAVENOUS | Status: DC | PRN
  Administered 2019-10-09: 120 mg via INTRAVENOUS

## 2019-10-09 MED ORDER — CHLORHEXIDINE GLUCONATE CLOTH 2 % EX PADS: 6.0000 | MEDICATED_PAD | Freq: Once | CUTANEOUS | Status: DC

## 2019-10-09 MED ORDER — SCOPOLAMINE 1 MG/3DAYS TD PT72: 1.0000 | MEDICATED_PATCH | TRANSDERMAL | Status: DC

## 2019-10-09 MED ORDER — PROMETHAZINE HCL 25 MG/ML IJ SOLN: 6.2500 mg | INTRAMUSCULAR | Status: DC | PRN

## 2019-10-09 MED ORDER — LIDOCAINE-EPINEPHRINE 1 %-1:100000 IJ SOLN
INTRAMUSCULAR | Status: DC | PRN
Start: 2019-10-09 — End: 2019-10-09
  Administered 2019-10-09: 20 mL

## 2019-10-09 MED ORDER — MIDAZOLAM HCL 2 MG/2ML IJ SOLN
INTRAMUSCULAR | Status: DC | PRN
  Administered 2019-10-09: 2 mg via INTRAVENOUS

## 2019-10-09 MED ORDER — OXYCODONE HCL 5 MG PO TABS
5.0000 mg | ORAL_TABLET | Freq: Four times a day (QID) | ORAL | 0 refills | Status: DC | PRN
Start: 2019-10-09 — End: 2020-05-31

## 2019-10-09 MED ORDER — CEFAZOLIN SODIUM-DEXTROSE 2-4 GM/100ML-% IV SOLN
INTRAVENOUS | Status: AC
  Filled 2019-10-09: qty 100

## 2019-10-09 MED ORDER — LIDOCAINE 2% (20 MG/ML) 5 ML SYRINGE
INTRAMUSCULAR | Status: DC | PRN
  Administered 2019-10-09: 80 mg via INTRAVENOUS

## 2019-10-09 MED ORDER — ACETAMINOPHEN 500 MG PO TABS
ORAL_TABLET | ORAL | Status: AC
  Filled 2019-10-09: qty 2

## 2019-10-09 MED ORDER — METHYLENE BLUE 0.5 % INJ SOLN
INTRAVENOUS | Status: AC
  Filled 2019-10-09: qty 10

## 2019-10-09 MED ORDER — CHLORHEXIDINE GLUCONATE 0.12 % MT SOLN: 15.0000 mL | OROMUCOSAL | Status: AC

## 2019-10-09 MED ORDER — LACTATED RINGERS IV SOLN: INTRAVENOUS | Status: DC | PRN

## 2019-10-09 MED ORDER — TECHNETIUM TC 99M SULFUR COLLOID FILTERED
0.5000 | Freq: Once | INTRAVENOUS | Status: AC | PRN
  Administered 2019-10-09: 0.5 via INTRADERMAL

## 2019-10-09 MED ORDER — MIDAZOLAM HCL 2 MG/2ML IJ SOLN
INTRAMUSCULAR | Status: AC
  Filled 2019-10-09: qty 2

## 2019-10-09 MED ORDER — DEXAMETHASONE SODIUM PHOSPHATE 10 MG/ML IJ SOLN
INTRAMUSCULAR | Status: DC | PRN
  Administered 2019-10-09: 10 mg via INTRAVENOUS

## 2019-10-09 MED ORDER — CHLORHEXIDINE GLUCONATE 0.12 % MT SOLN
OROMUCOSAL | Status: AC
  Administered 2019-10-09: 15 mL via OROMUCOSAL
  Filled 2019-10-09: qty 15

## 2019-10-09 MED ORDER — FENTANYL CITRATE (PF) 250 MCG/5ML IJ SOLN
INTRAMUSCULAR | Status: DC | PRN
  Administered 2019-10-09 (×3): 50 ug via INTRAVENOUS

## 2019-10-09 MED ORDER — FENTANYL CITRATE (PF) 250 MCG/5ML IJ SOLN
INTRAMUSCULAR | Status: AC
  Filled 2019-10-09: qty 5

## 2019-10-09 MED ORDER — PROPOFOL 10 MG/ML IV BOLUS
INTRAVENOUS | Status: DC | PRN
  Administered 2019-10-09: 150 mg via INTRAVENOUS

## 2019-10-09 MED ORDER — ACETAMINOPHEN 500 MG PO TABS
1000.0000 mg | ORAL_TABLET | Freq: Once | ORAL | Status: AC
  Administered 2019-10-09: 1000 mg via ORAL

## 2019-10-09 MED ORDER — PHENYLEPHRINE 40 MCG/ML (10ML) SYRINGE FOR IV PUSH (FOR BLOOD PRESSURE SUPPORT)
PREFILLED_SYRINGE | INTRAVENOUS | Status: DC | PRN
  Administered 2019-10-09 (×2): 80 ug via INTRAVENOUS

## 2019-10-09 SURGICAL SUPPLY — 50 items
BENZOIN TINCTURE PRP APPL 2/3 (GAUZE/BANDAGES/DRESSINGS) IMPLANT
BLADE SURG 10 STRL SS (BLADE) ×2 IMPLANT
BLADE SURG 11 STRL SS (BLADE) ×2 IMPLANT
BNDG COHESIVE 4X5 TAN STRL (GAUZE/BANDAGES/DRESSINGS) ×2 IMPLANT
CANISTER SUCT 3000ML PPV (MISCELLANEOUS) ×2 IMPLANT
CHLORAPREP W/TINT 26 (MISCELLANEOUS) ×2 IMPLANT
CLIP VESOCCLUDE MED 6/CT (CLIP) ×2 IMPLANT
CLSR STERI-STRIP ANTIMIC 1/2X4 (GAUZE/BANDAGES/DRESSINGS) ×2 IMPLANT
CNTNR URN SCR LID CUP LEK RST (MISCELLANEOUS) IMPLANT
CONT SPEC 4OZ STRL OR WHT (MISCELLANEOUS)
COVER MAYO STAND STRL (DRAPES) IMPLANT
COVER PROBE W GEL 5X96 (DRAPES) ×2 IMPLANT
COVER SURGICAL LIGHT HANDLE (MISCELLANEOUS) ×2 IMPLANT
COVER WAND RF STERILE (DRAPES) IMPLANT
DERMABOND ADVANCED (GAUZE/BANDAGES/DRESSINGS) ×1
DERMABOND ADVANCED .7 DNX12 (GAUZE/BANDAGES/DRESSINGS) ×1 IMPLANT
DRAPE LAPAROSCOPIC ABDOMINAL (DRAPES) ×2 IMPLANT
DRAPE ORTHO SPLIT 77X108 STRL (DRAPES) ×2
DRAPE SURG ORHT 6 SPLT 77X108 (DRAPES) ×2 IMPLANT
DRSG TEGADERM 4X4.75 (GAUZE/BANDAGES/DRESSINGS) ×2 IMPLANT
ELECT REM PT RETURN 9FT ADLT (ELECTROSURGICAL) ×2
ELECTRODE REM PT RTRN 9FT ADLT (ELECTROSURGICAL) ×1 IMPLANT
GAUZE SPONGE 4X4 12PLY STRL (GAUZE/BANDAGES/DRESSINGS) ×2 IMPLANT
GLOVE BIO SURGEON STRL SZ 6 (GLOVE) ×2 IMPLANT
GLOVE INDICATOR 6.5 STRL GRN (GLOVE) ×2 IMPLANT
GOWN STRL REUS W/ TWL LRG LVL3 (GOWN DISPOSABLE) ×1 IMPLANT
GOWN STRL REUS W/TWL 2XL LVL3 (GOWN DISPOSABLE) ×2 IMPLANT
GOWN STRL REUS W/TWL LRG LVL3 (GOWN DISPOSABLE) ×1
KIT BASIN OR (CUSTOM PROCEDURE TRAY) ×2 IMPLANT
KIT TURNOVER KIT B (KITS) ×2 IMPLANT
NEEDLE 18GX1X1/2 (RX/OR ONLY) (NEEDLE) ×2 IMPLANT
NEEDLE FILTER BLUNT 18X 1/2SAF (NEEDLE)
NEEDLE FILTER BLUNT 18X1 1/2 (NEEDLE) IMPLANT
NEEDLE HYPO 25GX1X1/2 BEV (NEEDLE) ×4 IMPLANT
NS IRRIG 1000ML POUR BTL (IV SOLUTION) ×2 IMPLANT
PACK GENERAL/GYN (CUSTOM PROCEDURE TRAY) ×2 IMPLANT
PAD ARMBOARD 7.5X6 YLW CONV (MISCELLANEOUS) ×2 IMPLANT
PENCIL SMOKE EVACUATOR (MISCELLANEOUS) ×2 IMPLANT
SLEEVE SUCTION 125 (MISCELLANEOUS) ×2 IMPLANT
STAPLER VISISTAT 35W (STAPLE) IMPLANT
STOCKINETTE IMPERVIOUS 9X36 MD (GAUZE/BANDAGES/DRESSINGS) ×2 IMPLANT
STRIP CLOSURE SKIN 1/2X4 (GAUZE/BANDAGES/DRESSINGS) IMPLANT
SUT ETHILON 2 0 FS 18 (SUTURE) ×2 IMPLANT
SUT MNCRL AB 4-0 PS2 18 (SUTURE) ×4 IMPLANT
SUT SILK 2 0 SH (SUTURE) ×2 IMPLANT
SUT VIC AB 3-0 SH 27 (SUTURE) ×4
SUT VIC AB 3-0 SH 27X BRD (SUTURE) ×4 IMPLANT
SYR CONTROL 10ML LL (SYRINGE) ×4 IMPLANT
TOWEL GREEN STERILE (TOWEL DISPOSABLE) ×2 IMPLANT
TOWEL GREEN STERILE FF (TOWEL DISPOSABLE) ×2 IMPLANT

## 2019-10-09 NOTE — Transfer of Care (Signed)
Immediate Anesthesia Transfer of Care Note  Patient: Ariel [name]  Procedure(s) Performed: WIDE LOCAL EXCISION WITH ADVANCEMENT FLAP CLOSURE RIGHT HIP MELANOMA WITH SENTINEL LYMPH NODE BIOPSY (Right Hip)  Patient Location: PACU  Anesthesia Type:General  Level of Consciousness: drowsy and patient cooperative  Airway & Oxygen Therapy: Patient Spontanous Breathing  Post-op Assessment: Report given to RN, Post -op Vital signs reviewed and stable and Patient moving all extremities X 4  Post vital signs: Reviewed and stable  Last Vitals:  Vitals Value Taken Time  BP 116/53 10/09/19 0909  Temp    Pulse 75 10/09/19 0911  Resp 15 10/09/19 0911  SpO2 97 % 10/09/19 0911  Vitals shown include unvalidated device data.  Last Pain:  Vitals:   10/09/19 0649  TempSrc:   PainSc: 0-No pain      Patients Stated Pain Goal: 3 (93/81/82 [id number])  Complications: No complications documented.

## 2019-10-09 NOTE — Anesthesia Postprocedure Evaluation (Signed)
Anesthesia Post Note  Patient: Verla Bryngelson  Procedure(s) Performed: WIDE LOCAL EXCISION WITH ADVANCEMENT FLAP CLOSURE RIGHT HIP MELANOMA WITH SENTINEL LYMPH NODE BIOPSY (Right Hip)     Patient location during evaluation: PACU Anesthesia Type: General Level of consciousness: awake and alert Pain management: pain level controlled Vital Signs Assessment: post-procedure vital signs reviewed and stable Respiratory status: spontaneous breathing, nonlabored ventilation and respiratory function stable Cardiovascular status: blood pressure returned to baseline and stable Postop Assessment: no apparent nausea or vomiting Anesthetic complications: no   No complications documented.  Last Vitals:  Vitals:   10/09/19 0924 10/09/19 0934  BP: 112/66 129/61  Pulse: 76 74  Resp: 14 20  Temp:  36.7 C  SpO2: 98% 100%    Last Pain:  Vitals:   10/09/19 0934  TempSrc:   PainSc: 0-No pain                 Merlinda Frederick

## 2019-10-09 NOTE — Anesthesia Procedure Notes (Signed)
Procedure Name: Intubation Date/Time: 10/09/2019 7:45 AM Performed by: Darletta Moll, CRNA Pre-anesthesia Checklist: Patient identified, Emergency Drugs available, Suction available and Patient being monitored Patient Re-evaluated:Patient Re-evaluated prior to induction Oxygen Delivery Method: Circle system utilized Preoxygenation: Pre-oxygenation with 100% oxygen Induction Type: IV induction Ventilation: Mask ventilation without difficulty Laryngoscope Size: Mac and 3 Grade View: Grade I Tube type: Oral Tube size: 7.0 mm Number of attempts: 1 Airway Equipment and Method: Stylet Placement Confirmation: ETT inserted through vocal cords under direct vision,  positive ETCO2 and breath sounds checked- equal and bilateral Secured at: 21 cm Tube secured with: Tape Dental Injury: Teeth and Oropharynx as per pre-operative assessment

## 2019-10-09 NOTE — Interval H&P Note (Signed)
History and Physical Interval Note:  10/09/2019 6:58 AM  Ariel [name]  has presented today for surgery, with the diagnosis of RIGHT HIP MELANOMA.  The various methods of treatment have been discussed with the patient and family. After consideration of risks, benefits and other options for treatment, the patient has consented to  Procedure(s): WIDE LOCAL EXCISION WITH ADVANCEMENT FLAP CLOSURE RIGHT HIP MELANOMA WITH SENTINEL LYMPH NODE BIOPSY (Right) as a surgical intervention.  The patient's history has been reviewed, patient examined, no change in status, stable for surgery.  I have reviewed the patient's chart and labs.  Questions were answered to the patient's satisfaction.     Stark Klein

## 2019-10-09 NOTE — Discharge Instructions (Addendum)
[facility] °Office Phone Number [phone] ° ° POST OP INSTRUCTIONS ° °Always review your discharge instruction sheet given to you by the facility where your surgery was performed. ° °IF YOU HAVE DISABILITY OR FAMILY LEAVE FORMS, YOU MUST BRING THEM TO THE OFFICE FOR PROCESSING.  DO NOT GIVE THEM TO YOUR DOCTOR. ° °1. A prescription for pain medication may be given to you upon discharge.  Take your pain medication as prescribed, if needed.  If narcotic pain medicine is not needed, then you may take acetaminophen (Tylenol) or ibuprofen (Advil) as needed. °2. Take your usually prescribed medications unless otherwise directed °3. If you need a refill on your pain medication, please contact your pharmacy.  They will contact our office to request authorization.  Prescriptions will not be filled after 5pm or on week-ends. °4. You should eat very light the first 24 hours after surgery, such as soup, crackers, pudding, etc.  Resume your normal diet the day after surgery °5. It is common to experience some constipation if taking pain medication after surgery.  Increasing fluid intake and taking a stool softener will usually help or prevent this problem from occurring.  A mild laxative (Milk of Magnesia or Miralax) should be taken according to package directions if there are no bowel movements after 48 hours. °6. You may shower in 48 hours.  The surgical glue will flake off in 2-3 weeks.   °7. ACTIVITIES:  No strenuous activity or heavy lifting for 1 week.   °a. You may drive when you no longer are taking prescription pain medication, you can comfortably wear a seatbelt, and you can safely maneuver your car and apply brakes. °b. RETURN TO WORK:  __________1 week_______________ °You should see your doctor in the office for a follow-up appointment approximately three-four weeks after your surgery.   ° °WHEN TO CALL YOUR DOCTOR: °1. Fever over 101.0 °2. Nausea and/or vomiting. °3. Extreme swelling or  bruising. °4. Continued bleeding from incision. °5. Increased pain, redness, or drainage from the incision. ° °The clinic staff is available to answer your questions during regular business hours.  Please don’t hesitate to call and ask to speak to one of the nurses for clinical concerns.  If you have a medical emergency, go to the nearest emergency room or call 911.  A surgeon from [facility] is always on call at the hospital. ° °For further questions, please visit [email or web address]  ° °

## 2019-10-09 NOTE — Op Note (Signed)
PRE-OPERATIVE DIAGNOSIS: cT1N0 right hip melanoma  POST-OPERATIVE DIAGNOSIS:  Same  PROCEDURE:  Procedure(s): Wide local excision 1 cm margins, advancement flap closure for defect 6 cm x 3 cm, sentinel lymph node mapping and biopsy  SURGEON:  Surgeon(s): Stark Klein, MD  ASSIST:  Ewell Poe, RNFA  ANESTHESIA:   local and general  DRAINS: none   LOCAL MEDICATIONS USED:  MARCAINE    and XYLOCAINE   SPECIMEN:  Source of Specimen: wide local excision right hip melanoma, right sentinel lymph nodes x 3  FINDINGS:  No residual pigmented skin at melanoma site, blue mapping to SLN #2 and 3  DISPOSITION OF SPECIMEN:  PATHOLOGY  COUNTS:  YES  PLAN OF CARE: Discharge to home after PACU  PATIENT DISPOSITION:  PACU - hemodynamically stable.    PROCEDURE:   Pt was identified in the holding area, taken to the OR, and placed supine on the OR table.  General anesthesia was induced.  Time out was performed according to the surgical safety checklist.  When all was correct, we continued.  One mL methylene blue was injected intradermally around the melanoma biopsy site.    The patient was placed into the supine position with the hip bumped up.  The right hip and groin was prepped and draped in sterile fashion.  The melanoma was identified and 1 cm margins were marked out. This was turned into an ellipse Local was administered under the melanoma and the adjacent tissue.  A #10 blade was used to incise the skin around the melanoma.  The cautery was used to take the dissection down to the fascia.  The skin was marked in situ with orientation sutures.  The cautery was used to take the specimen off the fascia, and it was passed off the table.    Skin hooks were used to elevate the edges of the incision and the skin was freed up in all directions to create advancement flaps.  This was pulled together in an oblique orientation. The skin was pulled together to check the tension.  Deep interrupted 2-0  vicryl sutures were placed to relieve tension.  The skin was then reapproximated with 3-0 interrupted vicryl deep dermal sutures and 4-0 monocryl running subcuticular sutures.  One 2-0 nylon horizontal mattress sutures were placed as well.     A 4 cm groin incision was made with a #15 blade over the point of maximum intensity.  The subcutaneous tissues were divided with the cautery.  A Weitlaner retractor was used to assist with visualization.  The tonsil clamp was used to bluntly dissect the inguinal fat pad.  Three sentinel lymph nodes were identified.  SLN #1 was hot with cps 350.  SLn #2 was hot and blue with cps 320.  SLN #3 was hot and blue with cps 210.  Background count was 0.  The lymphovascular channels were clipped with hemoclips.  The nodes were passed off as specimens.  Hemostasis was achieved with the cautery.  The incision was irrigated and closed with 3-0 Vicryl deep dermal interrupted sutures and 4-0 Monocryl running subcuticular suture.  The axilla was dressed with dermabond.    The melanoma site was cleaned, dried, and dressed with Benzoin, steristrips, gauze, and tegaderm.    Needle, sponge, and instrument counts were correct.  The patient was awakened from anesthesia and taken to the PACU in stable condition.

## 2019-10-10 ENCOUNTER — Encounter (HOSPITAL_COMMUNITY): Payer: Self-pay | Admitting: General Surgery

## 2019-10-13 LAB — SURGICAL PATHOLOGY

## 2019-12-17 ENCOUNTER — Other Ambulatory Visit: Payer: Self-pay

## 2019-12-17 ENCOUNTER — Ambulatory Visit (INDEPENDENT_AMBULATORY_CARE_PROVIDER_SITE_OTHER): Payer: Medicare Other

## 2019-12-17 DIAGNOSIS — Z23 Encounter for immunization: Secondary | ICD-10-CM

## 2019-12-31 DIAGNOSIS — L578 Other skin changes due to chronic exposure to nonionizing radiation: Secondary | ICD-10-CM | POA: Diagnosis not present

## 2019-12-31 DIAGNOSIS — L570 Actinic keratosis: Secondary | ICD-10-CM | POA: Diagnosis not present

## 2019-12-31 DIAGNOSIS — L719 Rosacea, unspecified: Secondary | ICD-10-CM | POA: Diagnosis not present

## 2019-12-31 DIAGNOSIS — L814 Other melanin hyperpigmentation: Secondary | ICD-10-CM | POA: Diagnosis not present

## 2019-12-31 DIAGNOSIS — C4371 Malignant melanoma of right lower limb, including hip: Secondary | ICD-10-CM | POA: Diagnosis not present

## 2019-12-31 DIAGNOSIS — D485 Neoplasm of uncertain behavior of skin: Secondary | ICD-10-CM | POA: Diagnosis not present

## 2019-12-31 DIAGNOSIS — L821 Other seborrheic keratosis: Secondary | ICD-10-CM | POA: Diagnosis not present

## 2020-01-30 DIAGNOSIS — R350 Frequency of micturition: Secondary | ICD-10-CM | POA: Diagnosis not present

## 2020-03-10 DIAGNOSIS — H43812 Vitreous degeneration, left eye: Secondary | ICD-10-CM | POA: Diagnosis not present

## 2020-04-14 DIAGNOSIS — R350 Frequency of micturition: Secondary | ICD-10-CM | POA: Diagnosis not present

## 2020-04-21 DIAGNOSIS — H18413 Arcus senilis, bilateral: Secondary | ICD-10-CM | POA: Diagnosis not present

## 2020-04-27 DIAGNOSIS — L719 Rosacea, unspecified: Secondary | ICD-10-CM | POA: Diagnosis not present

## 2020-05-05 DIAGNOSIS — R350 Frequency of micturition: Secondary | ICD-10-CM | POA: Diagnosis not present

## 2020-05-25 ENCOUNTER — Encounter: Payer: Self-pay | Admitting: Family

## 2020-05-26 NOTE — Telephone Encounter (Signed)
Patient has not been seen in over one year, she was scheduled to come in for incontinence and getting referral. She will bring the information of provider that does the treatment she is looking for

## 2020-05-31 ENCOUNTER — Other Ambulatory Visit: Payer: Self-pay

## 2020-05-31 ENCOUNTER — Ambulatory Visit (INDEPENDENT_AMBULATORY_CARE_PROVIDER_SITE_OTHER): Payer: Medicare Other | Admitting: Family

## 2020-05-31 ENCOUNTER — Encounter: Payer: Self-pay | Admitting: Family

## 2020-05-31 VITALS — BP 121/56 | HR 64 | Temp 99.1°F | Resp 16 | Ht 66.0 in | Wt 146.0 lb

## 2020-05-31 DIAGNOSIS — R32 Unspecified urinary incontinence: Secondary | ICD-10-CM | POA: Diagnosis not present

## 2020-05-31 DIAGNOSIS — L719 Rosacea, unspecified: Secondary | ICD-10-CM | POA: Diagnosis not present

## 2020-05-31 DIAGNOSIS — C439 Malignant melanoma of skin, unspecified: Secondary | ICD-10-CM | POA: Diagnosis not present

## 2020-05-31 NOTE — Progress Notes (Signed)
Subjective:    Patient ID: Ariel [name], female    DOB: May 30, 1955, 65 y.o.   MRN: [id number]  HPI  Patient is a 65 yr old female who presents today to discuss urinary incontinence.  She has had issues with incontinence for many years.  She has urge incontinence as well as stress incontinence.  She has undergone several evaluations with urology and tried several medications including Myrbetriq.  She noted significant dry mouth and dry eyes with the Myrbetriq and ultimately had to discontinue.  She is feeling frustrated at this point.  She has been doing some research and found a Restaurant manager, fast food in [place] who does a practice called counterstrain therapy which she reports has been helpful for patients with urinary incontinence.  She is interested in exploring additional options.  Of note, the chiropractor that she has reached out to is:  [address], [address] [place] [phone] Fax [phone]   Review of Systems  Genitourinary: Positive for urgency. Negative for difficulty urinating, dysuria and frequency.     Past Medical History:  Diagnosis Date  . Chicken pox   . Complication of anesthesia    Per patient, very slow to wake up after anesthesia  . High cholesterol   . PONV (postoperative nausea and vomiting)   . Urinary incontinence      Social History   Socioeconomic History  . Marital status: Widowed    Spouse name: Not on file  . Number of children: Not on file  . Years of education: Not on file  . Highest education level: Not on file  Occupational History  . Not on file  Tobacco Use  . Smoking status: Never Smoker  . Smokeless tobacco: Never Used  Vaping Use  . Vaping Use: Never used  Substance and Sexual Activity  . Alcohol use: Never  . Drug use: Never  . Sexual activity: Not on file  Other Topics Concern  . Not on file  Social History Narrative   Lives with husband   One Son- lives locally, 1 grand-daughter   Daughter-  [place]   Completed Hotel manager school   Works as a Probation officer.         Social Determinants of Radio broadcast assistant Strain: Not on Art therapist Insecurity: Not on file  Transportation Needs: Not on file  Physical Activity: Not on file  Stress: Not on file  Social Connections: Not on file  Intimate Partner Violence: Not on file    Past Surgical History:  Procedure Laterality Date  . APPENDECTOMY  1962  . BLADDER SUSPENSION    . EXCISION MELANOMA WITH SENTINEL LYMPH NODE BIOPSY Right 10/09/2019   Procedure: WIDE LOCAL EXCISION WITH ADVANCEMENT FLAP CLOSURE RIGHT HIP MELANOMA WITH SENTINEL LYMPH NODE BIOPSY;  Surgeon: Stark Klein, MD;  Location: [facility];  Service: General;  Laterality: Right;  . HERNIA REPAIR  2009  . MOUTH SURGERY  11/2015   gum surgery and bone implant  . WISDOM TOOTH EXTRACTION      Family History  Problem Relation Age of Onset  . Hyperlipidemia Other        died 60- "natural causes"  . Coronary artery disease Father        died at 53  . COPD Father   . Alcohol abuse Father   . Diabetes Mellitus II Father   . Hyperlipidemia Other        4 siblings   . Hypertension Other  3 siblings  . Coronary artery disease Sister   . Heart attack Brother     Allergies  Allergen Reactions  . Codeine Nausea Only    Current Outpatient Medications on File Prior to Visit  Medication Sig Dispense Refill  . acetaminophen (TYLENOL) 500 MG tablet Take 500-1,000 mg by mouth every 6 (six) hours as needed (for pain).    Marland Kitchen ascorbic acid (VITAMIN C) 500 MG tablet Take 500 mg by mouth daily.    Marland Kitchen doxycycline (VIBRA-TABS) 100 MG tablet Take 100 mg by mouth daily.    . Omega-3 Fatty Acids (FISH OIL) 500 MG CAPS Take 500 mg by mouth daily.     Marland Kitchen OVER THE COUNTER MEDICATION Take 1-2 capsules by mouth See admin instructions. Balance Of Nature Fruit And Vegetable Supplements  Take 4 capsules in the morning (2 Fruit + 2 Vegetable) & take 2 capsules in the  evening (1 Fruit + 1 Vegetable)     No current facility-administered medications on file prior to visit.    BP (!) 121/56 (BP Location: Left Arm, Patient Position: Sitting, Cuff Size: Small)   Pulse 64   Temp 99.1 F (37.3 C) (Oral)   Resp 16   Ht 5\' 6"  (1.676 m)   Wt 146 lb (66.2 kg)   SpO2 99%   BMI 23.57 kg/m       Objective:   Physical Exam Constitutional:      Appearance: She is well-developed.  Cardiovascular:     Rate and Rhythm: Normal rate and regular rhythm.     Heart sounds: Normal heart sounds. No murmur heard.   Pulmonary:     Effort: Pulmonary effort is normal. No respiratory distress.     Breath sounds: Normal breath sounds. No wheezing.  Psychiatric:        Behavior: Behavior normal.        Thought Content: Thought content normal.        Judgment: Judgment normal.           Assessment & Plan:  Urinary incontinence-uncontrolled.  At this point, I suggested she try meeting with a urogynecologist to discuss further treatment options.  She is hesitant to make the drive to [place] if it is not necessary.  She wishes to try local referral to your gynecologist first.  Referral has been placed.  Melanoma-this was resected in 2021.  She reports negative lymph nodes.  She continues to follow with her surgeon and dermatologist.  Rosacea-she reports that she is currently taking 1/2 tablet of the doxycycline for rosacea which she finds helpful.  This is being prescribed by her dermatologist.  This visit occurred during the SARS-CoV-2 public health emergency.  Safety protocols were in place, including screening questions prior to the visit, additional usage of staff PPE, and extensive cleaning of exam room while observing appropriate contact time as indicated for disinfecting solutions.

## 2020-05-31 NOTE — Patient Instructions (Signed)
You should be contacted about schedule your appointment with Urogynecology.

## 2020-06-04 ENCOUNTER — Encounter: Payer: Self-pay | Admitting: Family

## 2020-06-04 DIAGNOSIS — R32 Unspecified urinary incontinence: Secondary | ICD-10-CM

## 2020-06-14 ENCOUNTER — Telehealth: Payer: Self-pay

## 2020-06-14 DIAGNOSIS — C4371 Malignant melanoma of right lower limb, including hip: Secondary | ICD-10-CM | POA: Diagnosis not present

## 2020-06-14 NOTE — Telephone Encounter (Signed)
Ariel [name] is a 65 y.o. female was called and contacted re: New pt Pre appt call to collect history information. -Allergy -Medication -Confirm pharmacy -OB history   Pt was available.Chart was updated. Pt was notified to arrive 15 min early and we will need a urine sample when she arrives. Pt verbalized understanding.

## 2020-06-14 NOTE — Telephone Encounter (Signed)
Attempt made to contact Ariel [name] is a 65 y.o. female re: New pt Pre appt call to collect history information. -Allergy -Medication -Confirm pharmacy -OB history   Pt was not available. LM on the VM for the patient to call back

## 2020-06-14 NOTE — Progress Notes (Signed)
[facility] New Patient Evaluation and Consultation  Referring Provider: [name], NP PCP: [name], NP Date of Service: 06/15/2020  SUBJECTIVE Chief Complaint: New Patient (Initial Visit) (Referral for incontinence)  History of Present Illness: Ariel [name] is a 65 y.o. White or Caucasian female seen in consultation at the request of NP Inda Castle for evaluation of incontinence.    Review of records in Epic significant for: She has both stress and urge incontinence. Has tried many medications. Had to stop Myrbetriq due to dry mouth and eyes.   Records from [facility] show:  Previously had Urodynamic testing which showed low bladder capacity of 148ml and detrusor overactivity. Could not tolerate more filling without triggering DO. Demonstrated positive cough stress test on exam.  - Failed Vesicare and Toviaz before starting Myrbetriq. Recently started Gemtasa (samples) - Also tried physical therapy at the [facility] office  Urinary Symptoms: Leaks urine with cough/ sneeze, laughing, exercise, lifting, with a full bladder, with movement to the bathroom and with urgency.  Leaks several time(s) per day.  Pad use: 3 pads per day.   She is bothered by her UI symptoms.  Day time voids 15.  Nocturia: 3-4 times per night to void.  Voiding dysfunction: she empties her bladder well.  does not use a catheter to empty bladder.  When urinating, she feels dribbling after finishing and the need to urinate multiple times in a row Drinks: a few cups decaf coffee, smoothie, 30oz plain water per day. Notices that the bladder is bothered by lemon or lime. Sometimes is irritated by the coffee.  Started Gemtasa samples and developed dryness in her eyes and mouth Tried pelvic PT for about 2 months.   UTIs: 0 UTI's in the last year.   Denies history of blood in urine and kidney or bladder stones  Pelvic Organ Prolapse Symptoms:                  She Denies a  feeling of a bulge the vaginal area.   Bowel Symptom: Bowel movements: 1 time(s) per day Stool consistency: hard Straining: yes.  Splinting: no.  Incomplete evacuation: yes.  She Denies accidental bowel leakage / fecal incontinence Bowel regimen: fiber Last colonoscopy: Date 2010, Results small polyp removed  Sexual Function Sexually active: no.   Pelvic Pain Denies pelvic pain   Past Medical History:  Past Medical History:  Diagnosis Date  . Chicken pox   . Complication of anesthesia    Per patient, very slow to wake up after anesthesia  . High cholesterol   . Melanoma ([facility]) 2021   right hip  . PONV (postoperative nausea and vomiting)   . Urinary incontinence      Past Surgical History:   Past Surgical History:  Procedure Laterality Date  . APPENDECTOMY  1962  . EXCISION MELANOMA WITH SENTINEL LYMPH NODE BIOPSY Right 10/09/2019   Procedure: WIDE LOCAL EXCISION WITH ADVANCEMENT FLAP CLOSURE RIGHT HIP MELANOMA WITH SENTINEL LYMPH NODE BIOPSY;  Surgeon: Stark Klein, MD;  Location: [facility];  Service: General;  Laterality: Right;  . HERNIA REPAIR  2009  . MOUTH SURGERY  11/2015   gum surgery and bone implant  . WISDOM TOOTH EXTRACTION       Past OB/GYN History: OB History  Gravida Para Term Preterm AB Living  2 2       2   SAB IAB Ectopic Multiple Live Births               #  Outcome Date GA Lbr Len/2nd Weight Sex Delivery Anes PTL Lv  2 Para           1 Para            NSVD x2 Menopausal: Yes, at age 64, Denies vaginal bleeding since menopause Last pap smear was 2018- negative.  Any history of abnormal pap smears: no.   Medications: She has a current medication list which includes the following prescription(s): acetaminophen, ascorbic acid, doxycycline, fish oil, and OVER THE COUNTER MEDICATION.   Allergies: Patient is allergic to codeine.   Social History:  Social History   Tobacco Use  . Smoking status: Never Smoker  . Smokeless tobacco: Never Used   Vaping Use  . Vaping Use: Never used  Substance Use Topics  . Alcohol use: Never  . 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 Regular exercise: Yes: yoga- leaks on occasion with exercise.   Family History:   Family History  Problem Relation Age of Onset  . Hyperlipidemia Other        died 63- "natural causes"  . Coronary artery disease Father        died at 80  . COPD Father   . Alcohol abuse Father   . Diabetes Mellitus II Father   . Hyperlipidemia Other        4 siblings   . Hypertension Other        3 siblings  . Coronary artery disease Sister   . Heart attack Brother      Review of Systems: Review of Systems  Constitutional: Negative for fever, malaise/fatigue and weight loss.  Respiratory: Negative for cough, shortness of breath and wheezing.   Cardiovascular: Negative for chest pain, palpitations and leg swelling.  Gastrointestinal: Negative for abdominal pain and blood in stool.  Genitourinary: Negative for dysuria.  Musculoskeletal: Negative for myalgias.  Skin: Negative for rash.  Neurological: Negative for dizziness and headaches.  Endo/Heme/Allergies: Does not bruise/bleed easily.  Psychiatric/Behavioral: Negative for depression. The patient is not nervous/anxious.      OBJECTIVE Physical Exam: Vitals:   06/15/20 1327  BP: 130/69  Pulse: 62  Weight: 146 lb (66.2 kg)  Height: 5\' 6"  (1.676 m)    Physical Exam Constitutional:      General: She is not in acute distress. Pulmonary:     Effort: Pulmonary effort is normal.  Abdominal:     General: There is no distension.     Palpations: Abdomen is soft.     Tenderness: There is no abdominal tenderness. There is no rebound.  Musculoskeletal:        General: No swelling. Normal range of motion.  Skin:    General: Skin is warm and dry.     Findings: No rash.  Neurological:     Mental Status: She is alert and oriented to person, place, and time.   Psychiatric:        Mood and Affect: Mood normal.        Behavior: Behavior normal.     GU / Detailed Urogynecologic Evaluation:  Pelvic Exam: Normal external female genitalia; Bartholin's and Skene's glands normal in appearance; urethral meatus normal in appearance, small swelling on mid-to-right suburethral area, appears superficial although could be a diverticulum.   CST: negative  Speculum exam reveals normal vaginal mucosa with atrophy. Cervix normal appearance. Uterus normal single, nontender. Adnexa no mass, fullness, tenderness.     Pelvic floor strength II/V  Pelvic floor musculature: Right levator non-tender, Right obturator non-tender, Left levator non-tender, Left obturator non-tender  POP-Q:   POP-Q  -2                                            Aa   -2                                           Ba  -7                                              C   3                                            Gh  4                                            Pb  10                                            tvl   -1.5                                            Ap  -1.5                                            Bp  -9                                              D     Rectal Exam:  Normal external rectum  Post-Void Residual (PVR) by Bladder Scan: In order to evaluate bladder emptying, we discussed obtaining a postvoid residual and she agreed to this procedure.  Procedure: The ultrasound unit was placed on the patient's abdomen in the suprapubic region after the patient had voided. A PVR of 19 ml was obtained by bladder scan.  Laboratory Results: POC urine: negative  I visualized the urine specimen, noting the specimen to be clear yellow  ASSESSMENT AND PLAN Ariel [name] is a 65 y.o. with:  1. Overactive bladder   2. Urinary urgency   3. SUI (stress urinary incontinence, female)   4. Suburethral cyst    1. OAB We discussed the symptoms of overactive  bladder (OAB), which include urinary urgency, urinary frequency, nocturia, with or without urge incontinence.  While we do not know the exact etiology of OAB, several treatment options exist. We discussed  management including behavioral therapy (decreasing bladder irritants, urge suppression strategies, timed voids, bladder retraining), physical therapy, medication; for refractory cases posterior tibial nerve stimulation, sacral neuromodulation, and intravesical botulinum toxin injection.  - She is potentially interested in intravesical botox and reviewed the risks, benefits and alternatives of treatment including but not limited to infection, need for self-catheterization and need for repeat therapy.   - We discussed that there is a 5-15% chance of needing to catheterize with Botox and that this usually resolves in a few months; however can persist for longer periods of time.   - Typically Botox injections would need to be repeated every 3-12 months since this is not a permanent therapy. She will return for the procedure.   2. SUI For treatment of stress urinary incontinence,  non-surgical options include expectant management, weight loss, physical therapy, as well as a pessary.  Surgical options include a midurethral sling, Burch urethropexy, and transurethral injection of a bulking agent. - She feels that OAB is more bothersome so would like to focus on this first  3. Suburethral cyst vs diverticulum - We discussed that overall if this is not bothersome to her then would not necessarily require intervention. However if it grew in size or she she was interested in surgical intervention for SUI, then would consider doing an MRI of the pelvis and cystoscopy to further investigate.  - If she decided on cysto/ botox for OAB then could look with cystoscope at this time as well.   Ariel Folds, MD   Medical Decision Making:  - Reviewed/ ordered a clinical laboratory test - Review and summation  of prior records

## 2020-06-15 ENCOUNTER — Other Ambulatory Visit: Payer: Self-pay

## 2020-06-15 ENCOUNTER — Encounter: Payer: Self-pay | Admitting: Obstetrics and Gynecology

## 2020-06-15 ENCOUNTER — Ambulatory Visit (INDEPENDENT_AMBULATORY_CARE_PROVIDER_SITE_OTHER): Payer: Medicare Other | Admitting: Obstetrics and Gynecology

## 2020-06-15 VITALS — BP 130/69 | HR 62 | Ht 66.0 in | Wt 146.0 lb

## 2020-06-15 DIAGNOSIS — N368 Other specified disorders of urethra: Secondary | ICD-10-CM | POA: Diagnosis not present

## 2020-06-15 DIAGNOSIS — N393 Stress incontinence (female) (male): Secondary | ICD-10-CM | POA: Diagnosis not present

## 2020-06-15 DIAGNOSIS — R3915 Urgency of urination: Secondary | ICD-10-CM | POA: Diagnosis not present

## 2020-06-15 DIAGNOSIS — N3281 Overactive bladder: Secondary | ICD-10-CM | POA: Diagnosis not present

## 2020-06-15 LAB — POCT URINALYSIS DIPSTICK
Appearance: NORMAL
Bilirubin, UA: NEGATIVE
Blood, UA: NEGATIVE
Glucose, UA: NEGATIVE
Ketones, UA: NEGATIVE
Leukocytes, UA: NEGATIVE
Nitrite, UA: NEGATIVE
Protein, UA: NEGATIVE
Spec Grav, UA: 1.01 (ref 1.010–1.025)
Urobilinogen, UA: 0.2 E.U./dL
pH, UA: 6.5 (ref 5.0–8.0)

## 2020-06-16 DIAGNOSIS — H18413 Arcus senilis, bilateral: Secondary | ICD-10-CM | POA: Diagnosis not present

## 2020-06-21 ENCOUNTER — Encounter: Payer: Self-pay | Admitting: Obstetrics and Gynecology

## 2020-07-07 NOTE — Progress Notes (Addendum)
Subjective:   Ariel [name] is a 65 y.o. female who presents for an Initial Medicare Annual Wellness Visit.   Review of Systems     Cardiac Risk Factors include: dyslipidemia;advanced age (>15men, >30 women)     Objective:    Today's Vitals   07/08/20 1228  BP: 128/68  Pulse: 69  Resp: 16  Temp: 98.5 F (36.9 C)  TempSrc: Temporal  SpO2: 96%  Weight: 145 lb 3.2 oz (65.9 kg)  Height: 5\' 6"  (1.676 m)   Body mass index is 23.44 kg/m.  Advanced Directives 07/08/2020 09/30/2019  Does Patient Have a Medical Advance Directive? Yes Yes  Type of Paramedic of [place];Living will [place] in Chart? No - copy requested No - copy requested    Current Medications (verified) Outpatient Encounter Medications as of 07/08/2020  Medication Sig  . acetaminophen (TYLENOL) 500 MG tablet Take 500-1,000 mg by mouth every 6 (six) hours as needed (for pain).  Marland Kitchen ascorbic acid (VITAMIN C) 500 MG tablet Take 500 mg by mouth daily.  Marland Kitchen doxycycline (VIBRA-TABS) 100 MG tablet Take 100 mg by mouth daily.  . Omega-3 Fatty Acids (FISH OIL) 500 MG CAPS Take 500 mg by mouth daily.   Marland Kitchen OVER THE COUNTER MEDICATION Take 1-2 capsules by mouth See admin instructions. Balance Of Nature Fruit And Vegetable Supplements  Take 4 capsules in the morning (2 Fruit + 2 Vegetable) & take 2 capsules in the evening (1 Fruit + 1 Vegetable)   No facility-administered encounter medications on file as of 07/08/2020.    Allergies (verified) Codeine   History: Past Medical History:  Diagnosis Date  . Chicken pox   . Complication of anesthesia    Per patient, very slow to wake up after anesthesia  . High cholesterol   . Melanoma ([place]) 2021   right hip  . PONV (postoperative nausea and vomiting)   . Urinary incontinence    Past Surgical History:  Procedure Laterality Date  . APPENDECTOMY  1962  . EXCISION MELANOMA WITH SENTINEL LYMPH  NODE BIOPSY Right 10/09/2019   Procedure: WIDE LOCAL EXCISION WITH ADVANCEMENT FLAP CLOSURE RIGHT HIP MELANOMA WITH SENTINEL LYMPH NODE BIOPSY;  Surgeon: Stark Klein, MD;  Location: [facility];  Service: General;  Laterality: Right;  . HERNIA REPAIR  2009  . MOUTH SURGERY  11/2015   gum surgery and bone implant  . WISDOM TOOTH EXTRACTION     Family History  Problem Relation Age of Onset  . Hyperlipidemia Other        died 57- "natural causes"  . Coronary artery disease Father        died at 87  . COPD Father   . Alcohol abuse Father   . Diabetes Mellitus II Father   . Hyperlipidemia Other        4 siblings   . Hypertension Other        3 siblings  . Coronary artery disease Sister   . Heart attack Brother    Social History   Socioeconomic History  . Marital status: Widowed    Spouse name: Not on file  . Number of children: Not on file  . Years of education: Not on file  . Highest education level: Not on file  Occupational History  . Occupation: retired  Tobacco Use  . Smoking status: Never Smoker  . Smokeless tobacco: Never Used  Vaping Use  . Vaping Use:  Never used  Substance and Sexual Activity  . Alcohol use: Never  . Drug use: Never  . Sexual activity: Not Currently  Other Topics Concern  . Not on file  Social History Narrative   Lives with husband   One Son- lives locally, 1 grand-daughter   Daughter- Sport and exercise psychologist. [place]   Completed Hotel manager school   Works as a Probation officer.         Social Determinants of Radio broadcast assistant Strain: [place]   . Difficulty of Paying Living Expenses: Not hard at all  Food Insecurity: No Food Insecurity  . Worried About Charity fundraiser in the Last Year: Never true  . Ran Out of Food in the Last Year: Never true  Transportation Needs: No Transportation Needs  . Lack of Transportation (Medical): No  . Lack of Transportation (Non-Medical): No  Physical Activity: Sufficiently Active  . Days of Exercise per Week: 4  days  . Minutes of Exercise per Session: 40 min  Stress: No Stress Concern Present  . Feeling of Stress : Not at all  Social Connections: Moderately Isolated  . Frequency of Communication with Friends and Family: More than three times a week  . Frequency of Social Gatherings with Friends and Family: More than three times a week  . Attends Religious Services: More than 4 times per year  . Active Member of Clubs or Organizations: No  . Attends Archivist Meetings: Never  . Marital Status: Widowed    Tobacco Counseling Counseling given: Not Answered   Clinical Intake:  Pre-visit preparation completed: Yes  Pain : No/denies pain     Nutritional Status: BMI of 19-24  Normal Nutritional Risks: None Diabetes: No  How often do you need to have someone help you when you read instructions, pamphlets, or other written materials from your doctor or pharmacy?: 1 - Never  Diabetic?No  Interpreter Needed?: No  Information entered by :: Caroleen Hamman LPN   Activities of Daily Living In your present state of health, do you have any difficulty performing the following activities: 07/08/2020 09/30/2019  Hearing? N N  Vision? N N  Difficulty concentrating or making decisions? N N  Walking or climbing stairs? N N  Dressing or bathing? N N  Doing errands, shopping? N N  Preparing Food and eating ? N -  Using the Toilet? N -  In the past six months, have you accidently leaked urine? Y -  Do you have problems with loss of bowel control? N -  Managing your Medications? N -  Managing your Finances? N -  Housekeeping or managing your Housekeeping? N -  Some recent data might be hidden    Patient Care Team: [name], NP as PCP - General (Internal Medicine) Luellen Pucker, MD (Obstetrics and Gynecology)  Indicate any recent Medical Services you may have received from other than [facility] providers in the past year (date may be approximate).     Assessment:   This  is a routine wellness examination for [facility].  Hearing/Vision screen  Hearing Screening   125Hz  250Hz  500Hz  1000Hz  2000Hz  3000Hz  4000Hz  6000Hz  8000Hz   Right ear:           Left ear:           Comments: No issues  Vision Screening Comments: Wears contacts Last eye exam-05/2020-Dr. Dianna Rossetti  Dietary issues and exercise activities discussed: Current Exercise Habits: Home exercise routine, Type of exercise: yoga;walking, Time (Minutes): 45, Frequency (  Times/Week): 4, Weekly Exercise (Minutes/Week): 180, Intensity: Mild, Exercise limited by: None identified  Goals Addressed            This Visit's Progress   . Patient Stated       Increase activity      Depression Screen PHQ 2/9 Scores 07/08/2020 05/31/2020 06/06/2017  PHQ - 2 Score 1 0 1  PHQ- 9 Score - - 4    Fall Risk Fall Risk  07/08/2020  Falls in the past year? 0  Number falls in past yr: 0  Injury with Fall? 0  Follow up Falls prevention discussed    FALL RISK PREVENTION PERTAINING TO THE HOME:  Any stairs in or around the home? Yes  If so, are there any without handrails? No  Home free of loose throw rugs in walkways, pet beds, electrical cords, etc? Yes  Adequate lighting in your home to reduce risk of falls? Yes   ASSISTIVE DEVICES UTILIZED TO PREVENT FALLS:  Life alert? No  Use of a cane, walker or w/c? No  Grab bars in the bathroom? Yes  Shower chair or bench in shower? No  Elevated toilet seat or a handicapped toilet? No   TIMED UP AND GO:  Was the test performed? Yes .  Length of time to ambulate 10 feet: 9 sec.   Gait steady and fast without use of assistive device  Cognitive Function:Normal cognitive status assessed by direct observation by this Nurse Health Advisor. No abnormalities found.          Immunizations Immunization History  Administered Date(s) Administered  . Fluad Quad(high Dose 65+) 12/17/2019  . Influenza,inj,Quad PF,6+ Mos 11/18/2012, 11/19/2013, 12/21/2014, 11/15/2015,  11/07/2017, 12/10/2018  . Janssen (J&J) SARS-COV-2 Vaccination 05/30/2019  . Tdap 11/19/2013  . Zoster Recombinat (Shingrix) 04/10/2018, 07/10/2018    TDAP status: Up to date  Flu Vaccine status: Up to date  Pneumococcal vaccine status: Completed during today's visit.  Covid-19 vaccine status: Information provided on how to obtain vaccines. Booster due  Qualifies for Shingles Vaccine? No   Zostavax completed No   Shingrix Completed?: Yes  Screening Tests Health Maintenance  Topic Date Due  . COLONOSCOPY (Pts 45-11yrs Insurance coverage will need to be confirmed)  03/02/2018  . PNA vac Low Risk Adult (1 of 2 - PCV13) Never done  . COVID-19 Vaccine (2 - Janssen risk 3-dose series) 06/27/2019  . INFLUENZA VACCINE  09/27/2020  . MAMMOGRAM  08/03/2021  . TETANUS/TDAP  11/20/2023  . DEXA SCAN  Completed  . Hepatitis C Screening  Completed  . HPV VACCINES  Aged Out    Health Maintenance  Health Maintenance Due  Topic Date Due  . COLONOSCOPY (Pts 45-67yrs Insurance coverage will need to be confirmed)  03/02/2018  . PNA vac Low Risk Adult (1 of 2 - PCV13) Never done  . COVID-19 Vaccine (2 - Janssen risk 3-dose series) 06/27/2019    Colorectal cancer screening: Due-Patient states she will call back when she is ready to schedule.  Mammogram status: Completed Bilateral 08/04/2019. Repeat every year Ordered today-To be scheduled with bone density after 08/03/20.  Bone Density status: Ordered today. Pt provided with contact info and advised to call to schedule appt.  Lung Cancer Screening: (Low Dose CT Chest recommended if Age 32-80 years, 30 pack-year currently smoking OR have quit w/in 15years.) does not qualify.     Additional Screening:  Hepatitis C Screening:Completed 08/18/2015  Vision Screening: Recommended annual ophthalmology exams for early detection of glaucoma and  other disorders of the eye. Is the patient up to date with their annual eye exam?  Yes  Who is the  provider or what is the name of the office in which the patient attends annual eye exams? Dr. Dianna Rossetti   Dental Screening: Recommended annual dental exams for proper oral hygiene  Community Resource Referral / Chronic Care Management: CRR required this visit?  No   CCM required this visit?  No      Plan:     I have personally reviewed and noted the following in the patient's chart:   . Medical and social history . Use of alcohol, tobacco or illicit drugs  . Current medications and supplements including opioid prescriptions. Patient is not currently taking opioid prescriptions. . Functional ability and status . Nutritional status . Physical activity . Advanced directives . List of other physicians . Hospitalizations, surgeries, and ER visits in previous 12 months . Vitals . Screenings to include cognitive, depression, and falls . Referrals and appointments  In addition, I have reviewed and discussed with patient certain preventive protocols, quality metrics, and best practice recommendations. A written personalized care plan for preventive services as well as general preventive health recommendations were provided to patient.     Patient would like to access avs on my-chart.   Marta Antu, LPN   [id number]  Nurse Health Advisor  Nurse Notes: None   Medical screening examination/treatment/procedure(s) were performed by non-physician practitioner and as supervising provider I was immediately available for consultation/collaboration.  I agree with above. Marrian Salvage, FNP

## 2020-07-08 ENCOUNTER — Ambulatory Visit (INDEPENDENT_AMBULATORY_CARE_PROVIDER_SITE_OTHER): Payer: Medicare Other

## 2020-07-08 ENCOUNTER — Other Ambulatory Visit: Payer: Self-pay

## 2020-07-08 VITALS — BP 128/68 | HR 69 | Temp 98.5°F | Resp 16 | Ht 66.0 in | Wt 145.2 lb

## 2020-07-08 DIAGNOSIS — Z23 Encounter for immunization: Secondary | ICD-10-CM | POA: Diagnosis not present

## 2020-07-08 DIAGNOSIS — Z1231 Encounter for screening mammogram for malignant neoplasm of breast: Secondary | ICD-10-CM

## 2020-07-08 DIAGNOSIS — Z0000 Encounter for general adult medical examination without abnormal findings: Secondary | ICD-10-CM

## 2020-07-08 DIAGNOSIS — Z780 Asymptomatic menopausal state: Secondary | ICD-10-CM

## 2020-07-08 NOTE — Patient Instructions (Signed)
Ariel [name] , Thank you for taking time to come for your Medicare Wellness Visit. I appreciate your ongoing commitment to your health goals. Please review the following plan we discussed and let me know if I can assist you in the future.   Screening recommendations/referrals: Colonoscopy: Due-. Please call the office when you are ready to schedule. Mammogram: Ordered today. Someone will be calling you to schedule. Bone Density: Ordered today. Someone will be calling you to schedule. Recommended yearly ophthalmology/optometry visit for glaucoma screening and checkup Recommended yearly dental visit for hygiene and checkup  Vaccinations: Influenza vaccine: Up to date Pneumococcal vaccine: Completed Prevnar-13 at today's visit Tdap vaccine: Up to date-Due-11/20/2023 Shingles vaccine: Completed vaccines   Covid-19: May obtain booster at your local pharmacy  Advanced directives: Please bring a copy for your chart  Conditions/risks identified: See problem list  Next appointment: Follow up in one year for your annual wellness visit    Preventive Care 65 Years and Older, Female Preventive care refers to lifestyle choices and visits with your health care provider that can promote health and wellness. What does preventive care include?  A yearly physical exam. This is also called an annual well check.  Dental exams once or twice a year.  Routine eye exams. Ask your health care provider how often you should have your eyes checked.  Personal lifestyle choices, including:  Daily care of your teeth and gums.  Regular physical activity.  Eating a healthy diet.  Avoiding tobacco and drug use.  Limiting alcohol use.  Practicing safe sex.  Taking low-dose aspirin every day.  Taking vitamin and mineral supplements as recommended by your health care provider. What happens during an annual well check? The services and screenings done by your health care provider during your annual well  check will depend on your age, overall health, lifestyle risk factors, and family history of disease. Counseling  Your health care provider may ask you questions about your:  Alcohol use.  Tobacco use.  Drug use.  Emotional well-being.  Home and relationship well-being.  Sexual activity.  Eating habits.  History of falls.  Memory and ability to understand (cognition).  Work and work Statistician.  Reproductive health. Screening  You may have the following tests or measurements:  Height, weight, and BMI.  Blood pressure.  Lipid and cholesterol levels. These may be checked every 5 years, or more frequently if you are over 68 years old.  Skin check.  Lung cancer screening. You may have this screening every year starting at age 42 if you have a 30-pack-year history of smoking and currently smoke or have quit within the past 15 years.  Fecal occult blood test (FOBT) of the stool. You may have this test every year starting at age 36.  Flexible sigmoidoscopy or colonoscopy. You may have a sigmoidoscopy every 5 years or a colonoscopy every 10 years starting at age 13.  Hepatitis C blood test.  Hepatitis B blood test.  Sexually transmitted disease (STD) testing.  Diabetes screening. This is done by checking your blood sugar (glucose) after you have not eaten for a while (fasting). You may have this done every 1-3 years.  Bone density scan. This is done to screen for osteoporosis. You may have this done starting at age 49.  Mammogram. This may be done every 1-2 years. Talk to your health care provider about how often you should have regular mammograms. Talk with your health care provider about your test results, treatment options, and if  necessary, the need for more tests. Vaccines  Your health care provider may recommend certain vaccines, such as:  Influenza vaccine. This is recommended every year.  Tetanus, diphtheria, and acellular pertussis (Tdap, Td) vaccine. You  may need a Td booster every 10 years.  Zoster vaccine. You may need this after age 63.  Pneumococcal 13-valent conjugate (PCV13) vaccine. One dose is recommended after age 45.  Pneumococcal polysaccharide (PPSV23) vaccine. One dose is recommended after age 40. Talk to your health care provider about which screenings and vaccines you need and how often you need them. This information is not intended to replace advice given to you by your health care provider. Make sure you discuss any questions you have with your health care provider. Document Released: 03/12/2015 Document Revised: 11/03/2015 Document Reviewed: 12/15/2014 Elsevier Interactive Patient Education  2017 [place] Prevention in the Home Falls can cause injuries. They can happen to people of all ages. There are many things you can do to make your home safe and to help prevent falls. What can I do on the outside of my home?  Regularly fix the edges of walkways and driveways and fix any cracks.  Remove anything that might make you trip as you walk through a door, such as a raised step or threshold.  Trim any bushes or trees on the path to your home.  Use bright outdoor lighting.  Clear any walking paths of anything that might make someone trip, such as rocks or tools.  Regularly check to see if handrails are loose or broken. Make sure that both sides of any steps have handrails.  Any raised decks and porches should have guardrails on the edges.  Have any leaves, snow, or ice cleared regularly.  Use sand or salt on walking paths during winter.  Clean up any spills in your garage right away. This includes oil or grease spills. What can I do in the bathroom?  Use night lights.  Install grab bars by the toilet and in the tub and shower. Do not use towel bars as grab bars.  Use non-skid mats or decals in the tub or shower.  If you need to sit down in the shower, use a plastic, non-slip stool.  Keep the floor  dry. Clean up any water that spills on the floor as soon as it happens.  Remove soap buildup in the tub or shower regularly.  Attach bath mats securely with double-sided non-slip rug tape.  Do not have throw rugs and other things on the floor that can make you trip. What can I do in the bedroom?  Use night lights.  Make sure that you have a light by your bed that is easy to reach.  Do not use any sheets or blankets that are too big for your bed. They should not hang down onto the floor.  Have a firm chair that has side arms. You can use this for support while you get dressed.  Do not have throw rugs and other things on the floor that can make you trip. What can I do in the kitchen?  Clean up any spills right away.  Avoid walking on wet floors.  Keep items that you use a lot in easy-to-reach places.  If you need to reach something above you, use a strong step stool that has a grab bar.  Keep electrical cords out of the way.  Do not use floor polish or wax that makes floors slippery. If you must  use wax, use non-skid floor wax.  Do not have throw rugs and other things on the floor that can make you trip. What can I do with my stairs?  Do not leave any items on the stairs.  Make sure that there are handrails on both sides of the stairs and use them. Fix handrails that are broken or loose. Make sure that handrails are as long as the stairways.  Check any carpeting to make sure that it is firmly attached to the stairs. Fix any carpet that is loose or worn.  Avoid having throw rugs at the top or bottom of the stairs. If you do have throw rugs, attach them to the floor with carpet tape.  Make sure that you have a light switch at the top of the stairs and the bottom of the stairs. If you do not have them, ask someone to add them for you. What else can I do to help prevent falls?  Wear shoes that:  Do not have high heels.  Have rubber bottoms.  Are comfortable and fit you  well.  Are closed at the toe. Do not wear sandals.  If you use a stepladder:  Make sure that it is fully opened. Do not climb a closed stepladder.  Make sure that both sides of the stepladder are locked into place.  Ask someone to hold it for you, if possible.  Clearly mark and make sure that you can see:  Any grab bars or handrails.  First and last steps.  Where the edge of each step is.  Use tools that help you move around (mobility aids) if they are needed. These include:  Canes.  Walkers.  Scooters.  Crutches.  Turn on the lights when you go into a dark area. Replace any light bulbs as soon as they burn out.  Set up your furniture so you have a clear path. Avoid moving your furniture around.  If any of your floors are uneven, fix them.  If there are any pets around you, be aware of where they are.  Review your medicines with your doctor. Some medicines can make you feel dizzy. This can increase your chance of falling. Ask your doctor what other things that you can do to help prevent falls. This information is not intended to replace advice given to you by your health care provider. Make sure you discuss any questions you have with your health care provider. Document Released: 12/10/2008 Document Revised: 07/22/2015 Document Reviewed: 03/20/2014 Elsevier Interactive Patient Education  2017 [employer].

## 2020-07-13 DIAGNOSIS — L905 Scar conditions and fibrosis of skin: Secondary | ICD-10-CM | POA: Diagnosis not present

## 2020-07-13 DIAGNOSIS — L578 Other skin changes due to chronic exposure to nonionizing radiation: Secondary | ICD-10-CM | POA: Diagnosis not present

## 2020-07-13 DIAGNOSIS — L719 Rosacea, unspecified: Secondary | ICD-10-CM | POA: Diagnosis not present

## 2020-07-13 DIAGNOSIS — L814 Other melanin hyperpigmentation: Secondary | ICD-10-CM | POA: Diagnosis not present

## 2020-07-13 DIAGNOSIS — L821 Other seborrheic keratosis: Secondary | ICD-10-CM | POA: Diagnosis not present

## 2020-07-13 DIAGNOSIS — L570 Actinic keratosis: Secondary | ICD-10-CM | POA: Diagnosis not present

## 2020-07-19 ENCOUNTER — Encounter: Payer: Self-pay | Admitting: Obstetrics and Gynecology

## 2020-07-19 DIAGNOSIS — M6289 Other specified disorders of muscle: Secondary | ICD-10-CM | POA: Diagnosis not present

## 2020-07-19 DIAGNOSIS — R350 Frequency of micturition: Secondary | ICD-10-CM | POA: Diagnosis not present

## 2020-07-21 DIAGNOSIS — H04123 Dry eye syndrome of bilateral lacrimal glands: Secondary | ICD-10-CM | POA: Diagnosis not present

## 2020-07-21 DIAGNOSIS — H10823 Rosacea conjunctivitis, bilateral: Secondary | ICD-10-CM | POA: Diagnosis not present

## 2020-07-29 ENCOUNTER — Encounter: Payer: Self-pay | Admitting: Family

## 2020-07-30 ENCOUNTER — Telehealth: Payer: Self-pay | Admitting: Family

## 2020-07-30 DIAGNOSIS — M2550 Pain in unspecified joint: Secondary | ICD-10-CM

## 2020-07-30 NOTE — Telephone Encounter (Signed)
See mychart.  

## 2020-08-02 ENCOUNTER — Telehealth: Payer: Self-pay | Admitting: Family

## 2020-08-02 DIAGNOSIS — R682 Dry mouth, unspecified: Secondary | ICD-10-CM

## 2020-08-02 NOTE — Telephone Encounter (Signed)
Please advise pt that rheumatology has declined to see her for consultation because her blood testing was negative.  They did suggest a referral to ENT to consider salivary gland biopsy to further evaluate for Sjogren's syndrome.  I have pended the referral if she is agreeable.

## 2020-08-03 NOTE — Telephone Encounter (Signed)
Patient advised of provider's comments and suggested referral. She "will like to think about this for a few days and she will call back to let us know if she will like to precede with referral"

## 2020-08-06 ENCOUNTER — Encounter: Payer: Self-pay | Admitting: Obstetrics and Gynecology

## 2020-08-11 DIAGNOSIS — H04123 Dry eye syndrome of bilateral lacrimal glands: Secondary | ICD-10-CM | POA: Diagnosis not present

## 2020-08-25 DIAGNOSIS — H04123 Dry eye syndrome of bilateral lacrimal glands: Secondary | ICD-10-CM | POA: Diagnosis not present

## 2020-08-25 DIAGNOSIS — H18413 Arcus senilis, bilateral: Secondary | ICD-10-CM | POA: Diagnosis not present

## 2020-09-01 DIAGNOSIS — H10823 Rosacea conjunctivitis, bilateral: Secondary | ICD-10-CM | POA: Diagnosis not present

## 2020-09-01 DIAGNOSIS — H04123 Dry eye syndrome of bilateral lacrimal glands: Secondary | ICD-10-CM | POA: Diagnosis not present

## 2020-09-15 ENCOUNTER — Ambulatory Visit: Payer: Medicare Other

## 2020-09-15 ENCOUNTER — Telehealth: Payer: Self-pay | Admitting: *Deleted

## 2020-09-15 NOTE — Telephone Encounter (Signed)
Patient was schedule for tdap.  With pt having medicare this must be done at pharmacy.  Left message on machine that she must go to pharmacy.

## 2020-09-15 NOTE — Progress Notes (Signed)
[facility] Return Visit  SUBJECTIVE  History of Present Illness: Ariel [name] is a 65 y.o. female seen in follow-up for overactive bladder. She was previously interested in botox but now wants to consider sacral neuromodulation.   Previously tried vesicare, [place], myrbetriq and gemtasa as well as physical therapy. Still voiding 15+ times per day and several times per night. Also leaks several times throughout the day. She has read through the materials about SNM and would like to proceed.    Past Medical History: Patient  has a past medical history of Chicken pox, Complication of anesthesia, High cholesterol, Melanoma ([place]) (2021), PONV (postoperative nausea and vomiting), and Urinary incontinence.   Past Surgical History: She  has a past surgical history that includes Appendectomy (1962); Hernia repair (2009); Mouth surgery (11/2015); Wisdom tooth extraction; and Excision melanoma with sentinel lymph node biopsy (Right, 10/09/2019).   Medications: She has a current medication list which includes the following prescription(s): acetaminophen, ascorbic acid, doxycycline, iron combinations, fish oil, and OVER THE COUNTER MEDICATION.   Allergies: Patient is allergic to codeine.   Social History: Patient  reports that she has never smoked. She has never used smokeless tobacco. She reports that she does not drink alcohol and does not use drugs.      OBJECTIVE     Physical Exam: Vitals:   09/16/20 0922  BP: 131/78  Pulse: 83  Weight: 142 lb (64.4 kg)  Height: 5\' 6"  (1.676 m)   Gen: No apparent distress, A&O x 3.  Detailed Urogynecologic Evaluation:  Deferred.     ASSESSMENT AND PLAN    Ariel [name] is a 65 y.o. with:  1. Overactive bladder     We discussed the role of sacral neuromodulation and how it works. It requires a test phase, and documentation of bladder function via diary. After a successful test period, a permanent wire and generator are placed in the  OR.   The goal of this therapy is at least a 50% improvement in symptoms. It is NOT realistic to expect a 100% cure.  We reviewed the fact that about 30% of patients fail the test phase and are not candidates for permanent generator placement.  We discussed the risk of infection and that the patient would not be able to get an MRI once the device is placed. There are two companies that provide this therapy: Medtronic and [employer]. [employer]' product is new and is similar to Medtr[employer]'s. For all procedures, we discussed risks of bleeding, infection, damage to surrounding organs including bowel, bladder, blood vessels, ureters and nerves, need for further surgery, risk of postoperative urinary incontinence or retention with need to catheterize, numbness and weakness at any body site, buttock pain, and the rarer risks of blood clot, heart attack, pneumonia, death.    She would be interested in the Medtronic device and the non-rechargeable battery.   Will plan to book surgery in Sept per her request then have her come for a pre-op.   Jaquita Folds, MD  Time spent: I spent 20 minutes dedicated to the care of this patient on the date of this encounter to include pre-visit review of records, face-to-face time with the patient discussing surgery options and post visit documentation.

## 2020-09-16 ENCOUNTER — Encounter: Payer: Self-pay | Admitting: Obstetrics and Gynecology

## 2020-09-16 ENCOUNTER — Other Ambulatory Visit: Payer: Self-pay

## 2020-09-16 ENCOUNTER — Ambulatory Visit (INDEPENDENT_AMBULATORY_CARE_PROVIDER_SITE_OTHER): Payer: Medicare Other | Admitting: Obstetrics and Gynecology

## 2020-09-16 VITALS — BP 131/78 | HR 83 | Ht 66.0 in | Wt 142.0 lb

## 2020-09-16 DIAGNOSIS — N3281 Overactive bladder: Secondary | ICD-10-CM | POA: Diagnosis not present

## 2020-09-28 DIAGNOSIS — H524 Presbyopia: Secondary | ICD-10-CM | POA: Diagnosis not present

## 2020-09-28 DIAGNOSIS — H2513 Age-related nuclear cataract, bilateral: Secondary | ICD-10-CM | POA: Diagnosis not present

## 2020-10-05 ENCOUNTER — Ambulatory Visit (HOSPITAL_BASED_OUTPATIENT_CLINIC_OR_DEPARTMENT_OTHER)
Admission: RE | Admit: 2020-10-05 | Payer: Medicare Other | Source: Home / Self Care | Admitting: Obstetrics and Gynecology

## 2020-10-05 ENCOUNTER — Encounter (HOSPITAL_BASED_OUTPATIENT_CLINIC_OR_DEPARTMENT_OTHER): Admission: RE | Payer: Self-pay | Source: Home / Self Care

## 2020-10-05 SURGERY — INSERTION, SACRAL NERVE STIMULATOR, INTERSTIM, STAGE 1
Anesthesia: Monitor Anesthesia Care

## 2020-10-06 ENCOUNTER — Emergency Department (HOSPITAL_BASED_OUTPATIENT_CLINIC_OR_DEPARTMENT_OTHER): Payer: Medicare Other

## 2020-10-06 ENCOUNTER — Other Ambulatory Visit: Payer: Self-pay

## 2020-10-06 ENCOUNTER — Encounter (HOSPITAL_BASED_OUTPATIENT_CLINIC_OR_DEPARTMENT_OTHER): Payer: Self-pay | Admitting: *Deleted

## 2020-10-06 ENCOUNTER — Observation Stay (HOSPITAL_BASED_OUTPATIENT_CLINIC_OR_DEPARTMENT_OTHER)
Admission: EM | Admit: 2020-10-06 | Discharge: 2020-10-07 | Disposition: A | Discharge status: Home / Self Care | Payer: Medicare Other | Attending: Family Medicine | Admitting: Family Medicine

## 2020-10-06 DIAGNOSIS — R9431 Abnormal electrocardiogram [ECG] [EKG]: Secondary | ICD-10-CM | POA: Diagnosis not present

## 2020-10-06 DIAGNOSIS — Y900 Blood alcohol level of less than 20 mg/100 ml: Secondary | ICD-10-CM | POA: Diagnosis not present

## 2020-10-06 DIAGNOSIS — D509 Iron deficiency anemia, unspecified: Secondary | ICD-10-CM

## 2020-10-06 DIAGNOSIS — R479 Unspecified speech disturbances: Secondary | ICD-10-CM | POA: Diagnosis present

## 2020-10-06 DIAGNOSIS — G459 Transient cerebral ischemic attack, unspecified: Principal | ICD-10-CM

## 2020-10-06 DIAGNOSIS — I6523 Occlusion and stenosis of bilateral carotid arteries: Secondary | ICD-10-CM | POA: Diagnosis not present

## 2020-10-06 DIAGNOSIS — M6281 Muscle weakness (generalized): Secondary | ICD-10-CM | POA: Insufficient documentation

## 2020-10-06 DIAGNOSIS — Z20822 Contact with and (suspected) exposure to covid-19: Secondary | ICD-10-CM | POA: Diagnosis not present

## 2020-10-06 DIAGNOSIS — I639 Cerebral infarction, unspecified: Secondary | ICD-10-CM

## 2020-10-06 DIAGNOSIS — Z79899 Other long term (current) drug therapy: Secondary | ICD-10-CM | POA: Diagnosis not present

## 2020-10-06 DIAGNOSIS — R29818 Other symptoms and signs involving the nervous system: Secondary | ICD-10-CM | POA: Diagnosis not present

## 2020-10-06 DIAGNOSIS — R4701 Aphasia: Secondary | ICD-10-CM | POA: Diagnosis present

## 2020-10-06 LAB — RAPID URINE DRUG SCREEN, HOSP PERFORMED
Amphetamines: NOT DETECTED
Barbiturates: NOT DETECTED
Benzodiazepines: NOT DETECTED
Cocaine: NOT DETECTED
Opiates: NOT DETECTED
Tetrahydrocannabinol: NOT DETECTED

## 2020-10-06 LAB — CBC
HCT: 33.7 % — ABNORMAL LOW (ref 36.0–46.0)
Hemoglobin: 10.5 g/dL — ABNORMAL LOW (ref 12.0–15.0)
MCH: 24 pg — ABNORMAL LOW (ref 26.0–34.0)
MCHC: 31.2 g/dL (ref 30.0–36.0)
MCV: 77.1 fL — ABNORMAL LOW (ref 80.0–100.0)
Platelets: 415 10*3/uL — ABNORMAL HIGH (ref 150–400)
RBC: 4.37 MIL/uL (ref 3.87–5.11)
RDW: 15.4 % (ref 11.5–15.5)
WBC: 7 10*3/uL (ref 4.0–10.5)
nRBC: 0 % (ref 0.0–0.2)

## 2020-10-06 LAB — COMPREHENSIVE METABOLIC PANEL
ALT: 21 U/L (ref 0–44)
AST: 23 U/L (ref 15–41)
Albumin: 3.2 g/dL — ABNORMAL LOW (ref 3.5–5.0)
Alkaline Phosphatase: 74 U/L (ref 38–126)
Anion gap: 9 (ref 5–15)
BUN: 10 mg/dL (ref 8–23)
CO2: 27 mmol/L (ref 22–32)
Calcium: 9.4 mg/dL (ref 8.9–10.3)
Chloride: 96 mmol/L — ABNORMAL LOW (ref 98–111)
Creatinine, Ser: 0.59 mg/dL (ref 0.44–1.00)
GFR, Estimated: >60 mL/min (ref >60)
Glucose, Bld: 148 mg/dL — ABNORMAL HIGH (ref 70–99)
Potassium: 3.9 mmol/L (ref 3.5–5.1)
Sodium: 132 mmol/L — ABNORMAL LOW (ref 135–145)
Total Bilirubin: 0.2 mg/dL — ABNORMAL LOW (ref 0.3–1.2)
Total Protein: 8.1 g/dL (ref 6.5–8.1)

## 2020-10-06 LAB — URINALYSIS, ROUTINE W REFLEX MICROSCOPIC
Bilirubin Urine: NEGATIVE
Glucose, UA: NEGATIVE mg/dL
Ketones, ur: NEGATIVE mg/dL
Leukocytes,Ua: NEGATIVE
Nitrite: NEGATIVE
Protein, ur: NEGATIVE mg/dL
Specific Gravity, Urine: <1.005 — ABNORMAL LOW (ref 1.005–1.030)
pH: 7 (ref 5.0–8.0)

## 2020-10-06 LAB — URINALYSIS, MICROSCOPIC (REFLEX)

## 2020-10-06 LAB — RESP PANEL BY RT-PCR (FLU A&B, COVID) ARPGX2
Influenza A by PCR: NEGATIVE
Influenza B by PCR: NEGATIVE
SARS Coronavirus 2 by RT PCR: NEGATIVE

## 2020-10-06 LAB — DIFFERENTIAL
Abs Immature Granulocytes: 0.03 10*3/uL (ref 0.00–0.07)
Basophils Absolute: 0 10*3/uL (ref 0.0–0.1)
Basophils Relative: 0 %
Eosinophils Absolute: 0 10*3/uL (ref 0.0–0.5)
Eosinophils Relative: 0 %
Immature Granulocytes: 0 %
Lymphocytes Relative: 19 %
Lymphs Abs: 1.3 10*3/uL (ref 0.7–4.0)
Monocytes Absolute: 0.3 10*3/uL (ref 0.1–1.0)
Monocytes Relative: 5 %
Neutro Abs: 5.3 10*3/uL (ref 1.7–7.7)
Neutrophils Relative %: 76 %

## 2020-10-06 LAB — PROTIME-INR
INR: 1.1 (ref 0.8–1.2)
Prothrombin Time: 13.9 seconds (ref 11.4–15.2)

## 2020-10-06 LAB — CBG MONITORING, ED: Glucose-Capillary: 145 mg/dL — ABNORMAL HIGH (ref 70–99)

## 2020-10-06 LAB — APTT: aPTT: 35 seconds (ref 24–36)

## 2020-10-06 LAB — ETHANOL: Alcohol, Ethyl (B): <10 mg/dL (ref <10)

## 2020-10-06 IMAGING — CT CT HEAD CODE STROKE
3 series · 14 of 47 positions shown, 16 images · non-contrast
Comparison: None.

CLINICAL DATA: Code stroke.  Acute neuro deficit.

EXAM:
CT HEAD WITHOUT CONTRAST
TECHNIQUE: Contiguous axial images were obtained from the base of the skull
through the vertex without intravenous contrast.

[Series 3: head wo · axial · 0.46mm/px · z∈[+1266,+1396]mm · 8 of 32 slices shown, 10 images]
[im 3/32  brain]
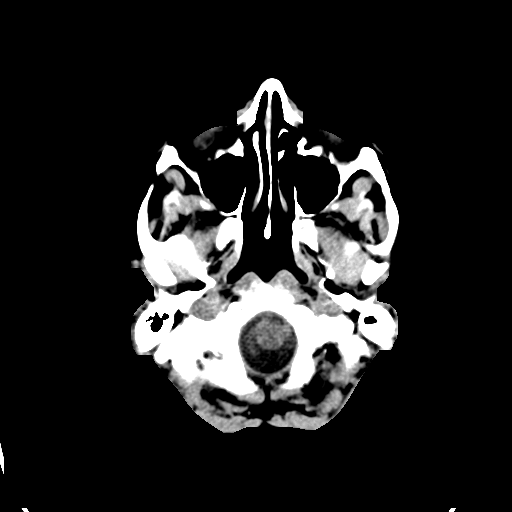
[im 3/32  bone]
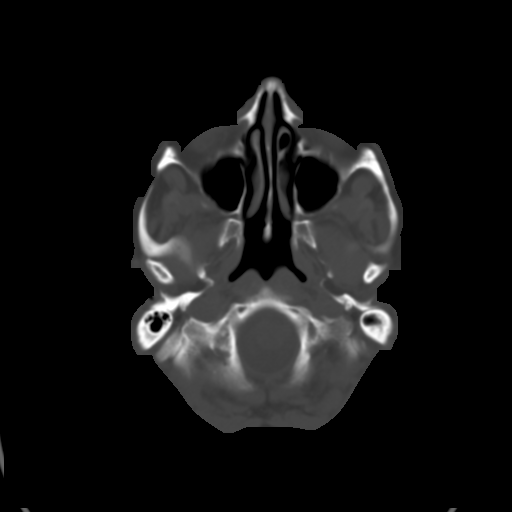
[im 7/32  brain]
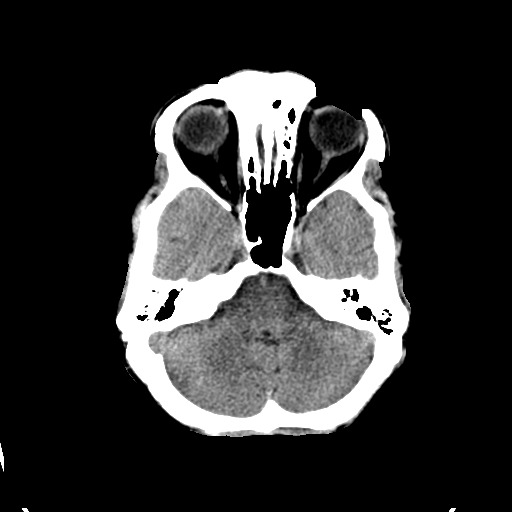
[im 10/32  brain]
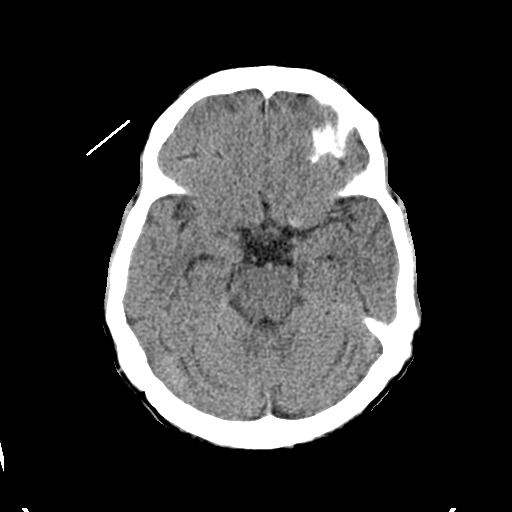
[im 14/32  brain]
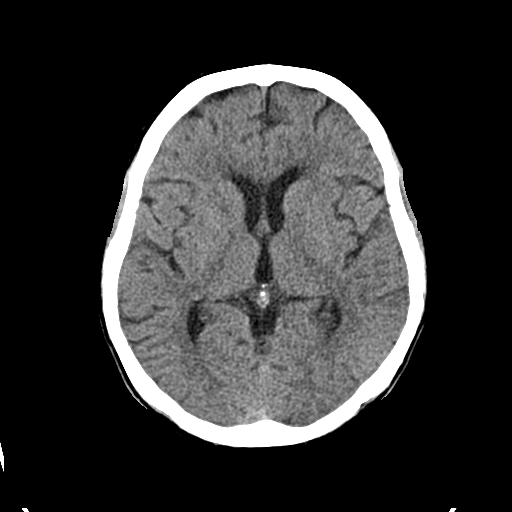
[im 18/32  brain]
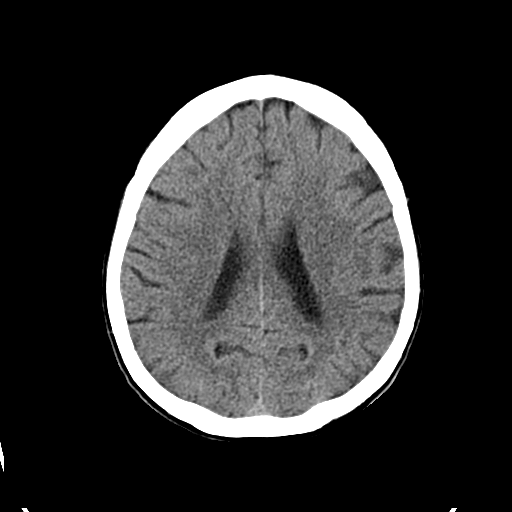
[im 18/32  bone]
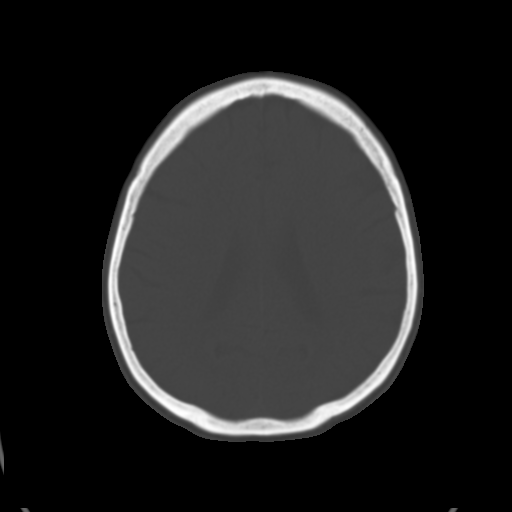
[im 22/32  brain]
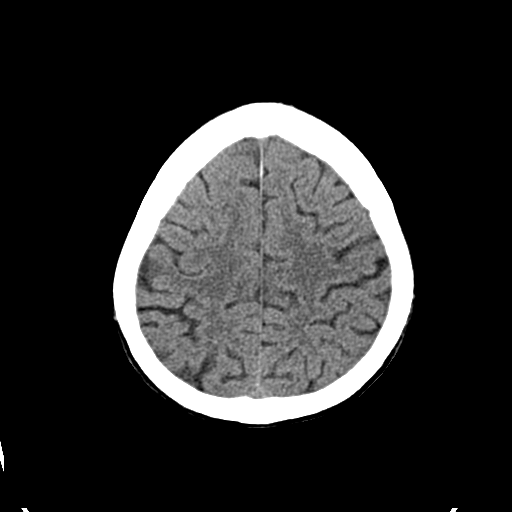
[im 25/32  brain]
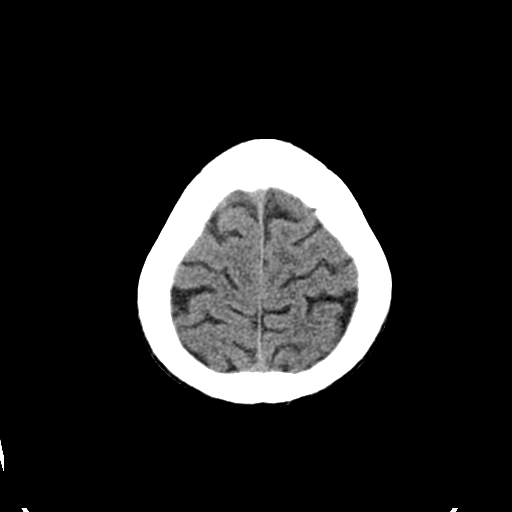
[im 29/32  brain]
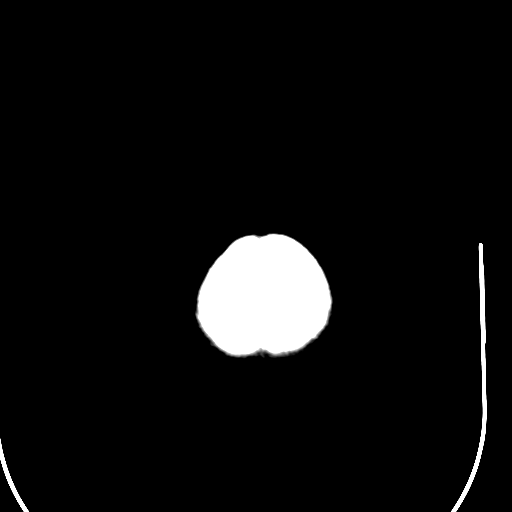

[Series 5: cor soft · coronal · 0.31mm/px · 3 of 84 slices shown]
[im 28/84  brain]
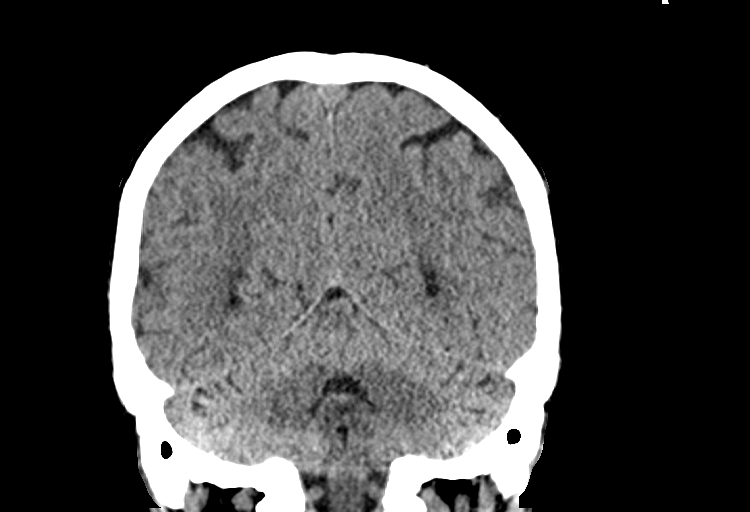
[im 37/84  brain]
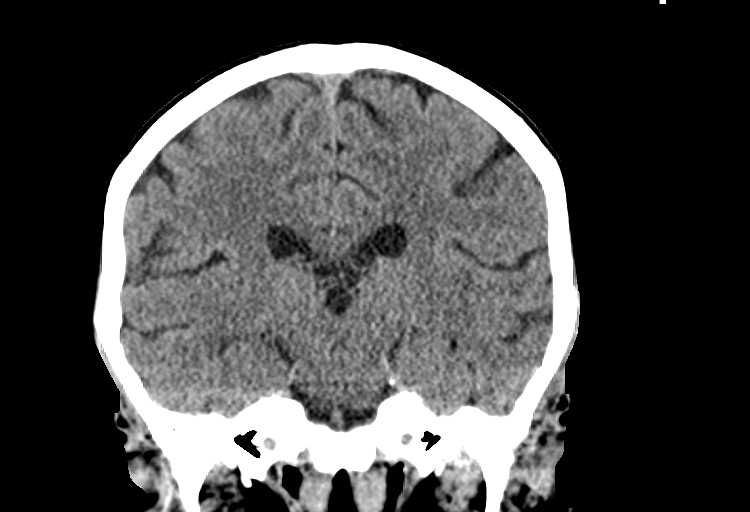
[im 47/84  brain]
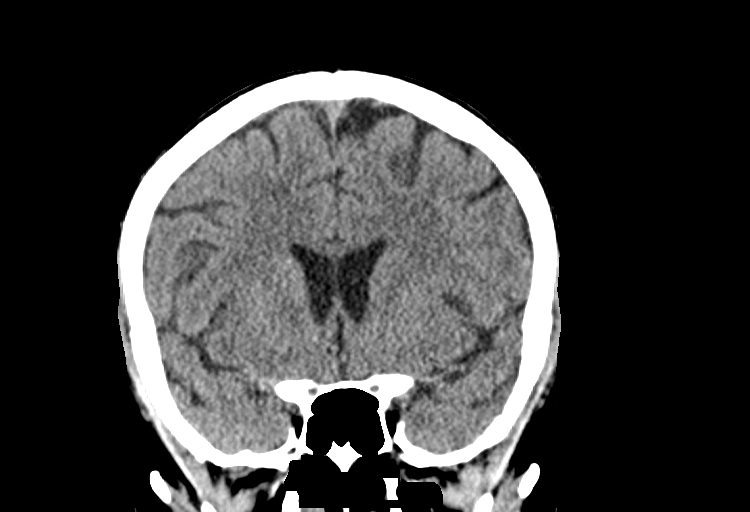

[Series 6: sag soft · sagittal · 0.31mm/px · 3 of 84 slices shown]
[im 28/84  brain]
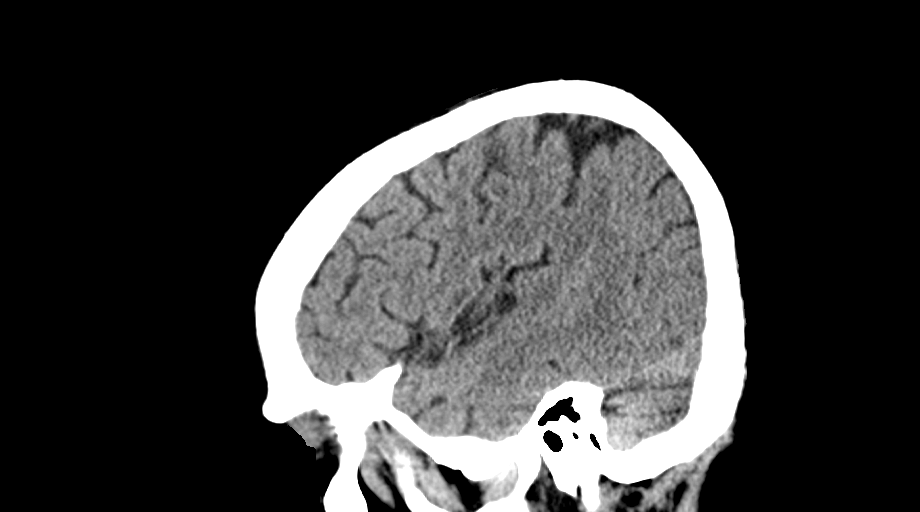
[im 42/84  brain]
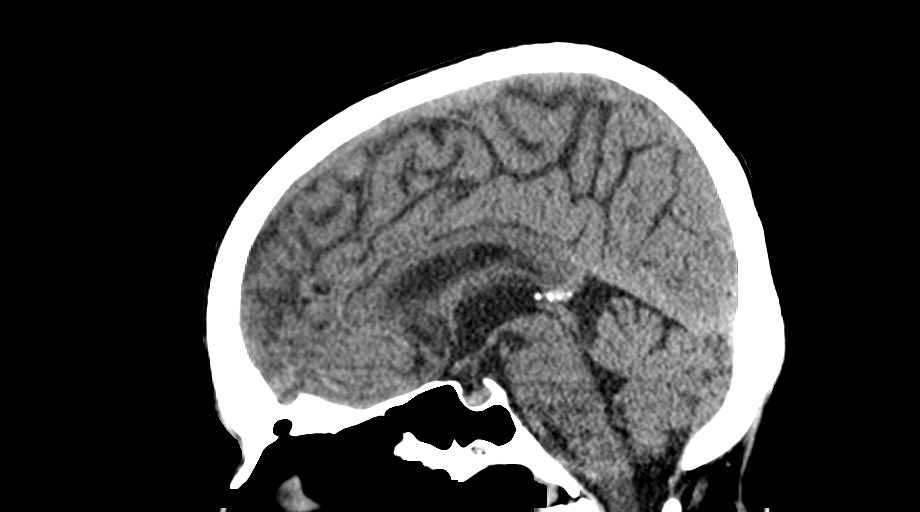
[im 56/84  brain]
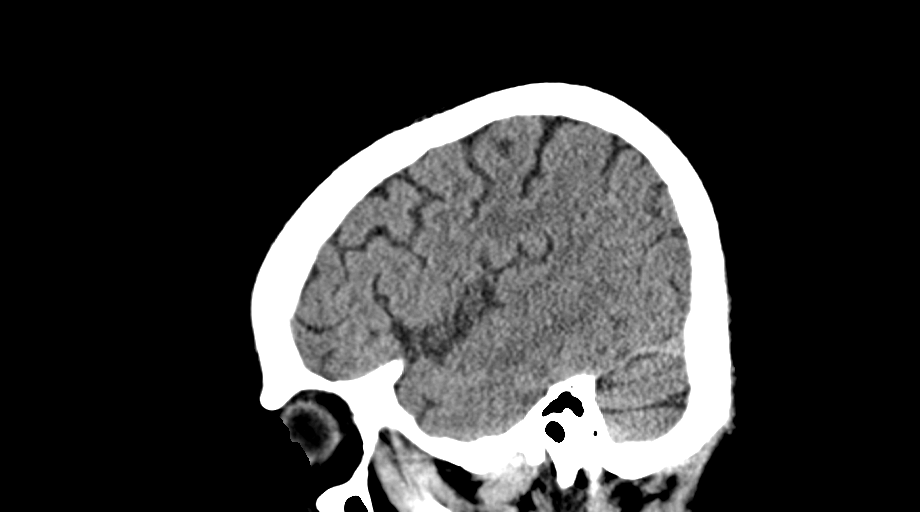

[14 of 47 positions shown; findings below may reference images not displayed]

FINDINGS: Brain: Ventricle size normal. Mild patchy white matter hypodensity
bilaterally, likely chronic

Negative for acute infarct, hemorrhage, mass

Vascular: Negative for hyperdense vessel

Skull: Negative

Sinuses/Orbits: Paranasal sinuses clear.  Negative orbit

Other: None

ASPECTS (Alberta Stroke Program Early CT Score)

- Ganglionic level infarction (caudate, lentiform nuclei, internal
capsule, insula, M1-M3 cortex): 7

- Supraganglionic infarction (M4-M6 cortex): 3

Total score (0-10 with 10 being normal): 10
IMPRESSION: 1. No acute abnormality
2. ASPECTS is 10
3. Mild white matter hypodensity likely due to chronic microvascular
ischemia.
4. These results were called by telephone at the time of
interpretation on [DATE] at [DATE] to provider SEDUDZI , who
verbally acknowledged these results.

## 2020-10-06 IMAGING — CT CT ANGIO HEAD
1 of 8 series · 6 of 33 positions shown · IV contrast (Omnipaque)
Comparison: Same day CT head.

CLINICAL DATA: Neuro deficit, acute, stroke suspected

EXAM:
CT ANGIOGRAPHY HEAD AND NECK
TECHNIQUE: Multidetector CT imaging of the head and neck was performed using
the standard protocol during bolus administration of intravenous
contrast. Multiplanar CT image reconstructions and MIPs were
obtained to evaluate the vascular anatomy. Carotid stenosis
measurements (when applicable) are obtained utilizing NASCET
criteria, using the distal internal carotid diameter as the
denominator.
CONTRAST:  100mL OMNIPAQUE IOHEXOL 350 MG/ML SOLN

[Series 7: axial thin · axial · 0.59mm/px · z∈[+1019,+1353]mm · 6 of 460 slices shown]
[im 66/460  soft-tissue]
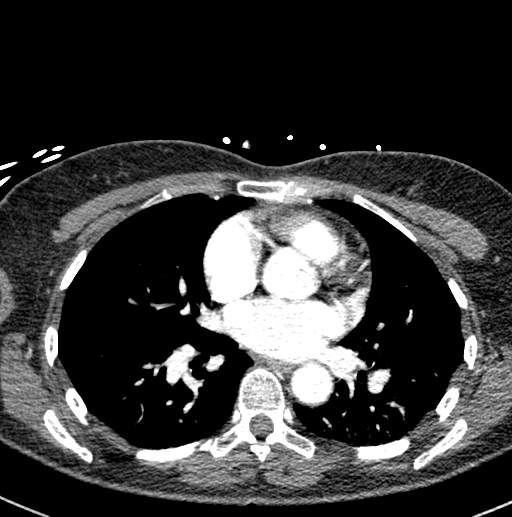
[im 132/460  bone]
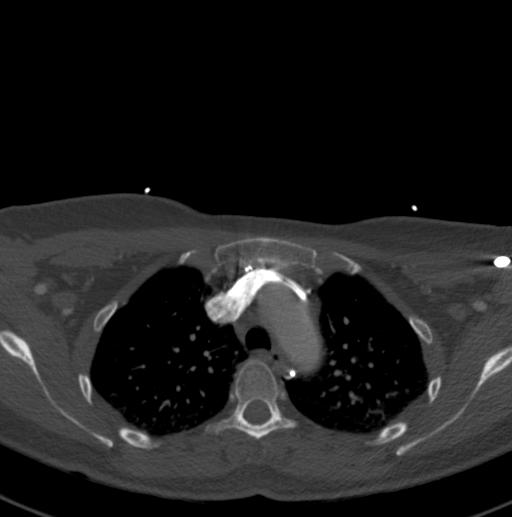
[im 197/460  soft-tissue]
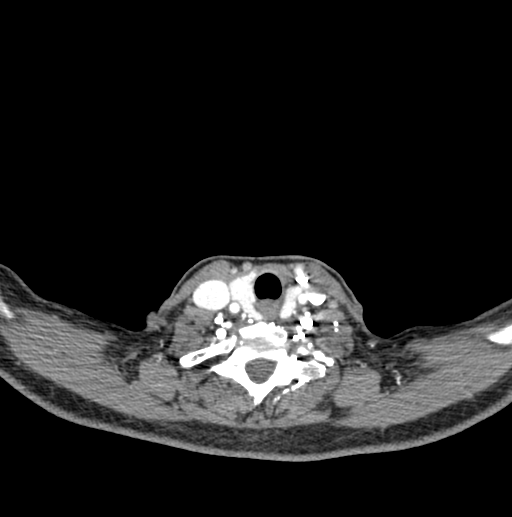
[im 263/460  bone]
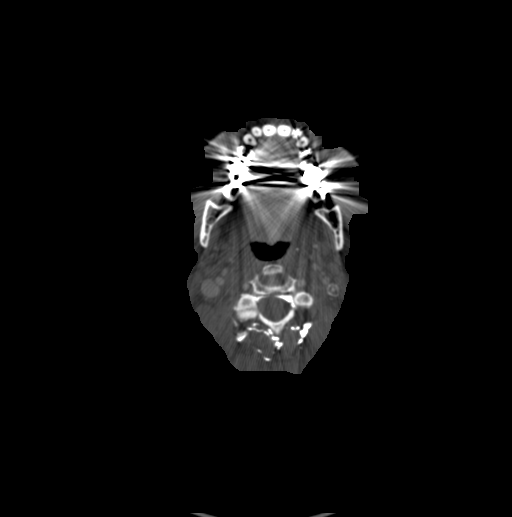
[im 328/460  soft-tissue]
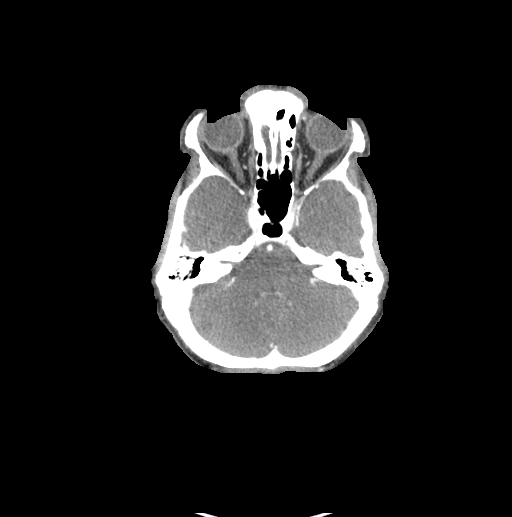
[im 394/460  bone]
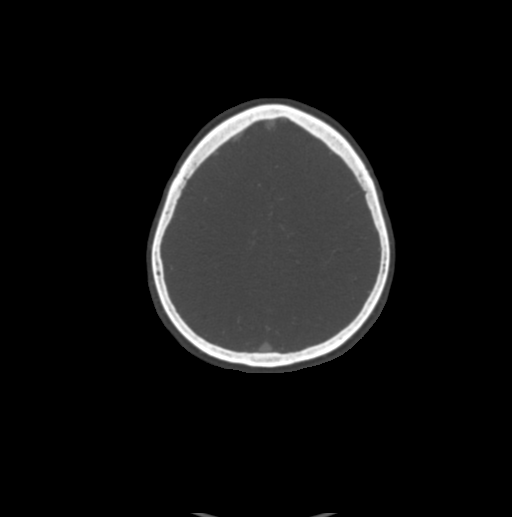

[6 of 33 positions shown; findings below may reference images not displayed]

FINDINGS: CTA NECK FINDINGS

Aortic arch: Great vessel origins are patent.

Right carotid system: No evidence of dissection, stenosis (50% or
greater) or occlusion.

Left carotid system: No evidence of dissection, stenosis (50% or
greater) or occlusion.

Vertebral arteries: Limited evaluation proximally due to extensive
streak artifact from adjacent venous contrast

Skeleton: Mild-to-moderate multilevel degenerative change.

Other neck: Suspected narrowing of the left subclavian vein as it
courses between the sternum and aorta at with reflux of venous
contrast into small veins in the neck. No acute findings in the
neck. The

Upper chest: Mild dependent ground-glass opacities, likely
atelectasis. Otherwise, lung apices are clear.

Review of the MIP images confirms the above findings

CTA HEAD FINDINGS

Anterior circulation: Calcific atherosclerosis of bilateral
intracranial ICAs with mild narrowing. Bilateral MCAs and ACAs are
patent without proximal hemodynamically significant stenosis. No
aneurysm identified.

Posterior circulation: Bilateral intradural vertebral arteries,
basilar artery, and posterior cerebral arteries are patent without
proximal hemodynamically significant stenosis.

Venous sinuses: As permitted by contrast timing, patent.

Anatomic variants: None

Review of the MIP images confirms the above findings
IMPRESSION: No large vessel occlusion or proximal hemodynamically significant
stenosis in the head or neck.

Findings discussed with Dr. ADIAVEO via telephone at [DATE] p.m.

## 2020-10-06 MED ORDER — ASPIRIN 325 MG PO TABS
650.0000 mg | ORAL_TABLET | Freq: Once | ORAL | Status: AC
  Administered 2020-10-06: 650 mg via ORAL
  Filled 2020-10-06: qty 2

## 2020-10-06 MED ORDER — IOHEXOL 350 MG/ML SOLN
100.0000 mL | Freq: Once | INTRAVENOUS | Status: AC | PRN
  Administered 2020-10-06: 100 mL via INTRAVENOUS

## 2020-10-06 MED ORDER — ASPIRIN EC 81 MG PO TBEC: 81.0000 mg | DELAYED_RELEASE_TABLET | Freq: Every day | ORAL | Status: DC

## 2020-10-06 NOTE — ED Notes (Signed)
Pt transported to CT ?

## 2020-10-06 NOTE — ED Notes (Signed)
ED Provider at bedside. 

## 2020-10-06 NOTE — ED Notes (Signed)
ED MD at bedside and Neuro speaking with patient on Code Stroke Cart

## 2020-10-06 NOTE — ED Provider Notes (Signed)
[facility] Provider Note   CSN: [id number] Arrival date & time: 10/06/20  [id number]  An emergency department physician performed an initial assessment on this suspected stroke patient at 1837.  History Chief Complaint  Patient presents with   Altered Mental Status    Ariel [name] is a 65 y.o. female.  Patient presents with sudden onset incomprehensible speech.  Granddaughter was with the patient and she was reportedly speaking fine around 1:20 PM.  Then around 130 noticed that her words and make any sense.  Patient otherwise had no complaints of any pain.  She is awake and alert.  No reports of recent vomiting or cough or diarrhea or fall or trauma.      Past Medical History:  Diagnosis Date   Chicken pox    Complication of anesthesia    Per patient, very slow to wake up after anesthesia   High cholesterol    Melanoma ([place]) 2021   right hip   PONV (postoperative nausea and vomiting)    Urinary incontinence     Patient Active Problem List   Diagnosis Date Noted   Melanoma ([place]) 2021   Multinodular goiter 12/22/2013   Subclinical hyperthyroidism 12/19/2013   Urinary incontinence 11/19/2013   Other and unspecified hyperlipidemia 11/19/2012   Routine general medical examination at a health care facility 11/18/2012    Past Surgical History:  Procedure Laterality Date   APPENDECTOMY  1962   EXCISION MELANOMA WITH SENTINEL LYMPH NODE BIOPSY Right 10/09/2019   Procedure: WIDE LOCAL EXCISION WITH ADVANCEMENT FLAP CLOSURE RIGHT HIP MELANOMA WITH SENTINEL LYMPH NODE BIOPSY;  Surgeon: Stark Klein, MD;  Location: [facility];  Service: General;  Laterality: Right;   HERNIA REPAIR  2009   MOUTH SURGERY  11/2015   gum surgery and bone implant   WISDOM TOOTH EXTRACTION       OB History     Gravida  2   Para  2   Term      Preterm      AB      Living  2      SAB      IAB      Ectopic      Multiple      Live Births               Family History  Problem Relation Age of Onset   Hyperlipidemia Other        died 76- "natural causes"   Coronary artery disease Father        died at 32   COPD Father    Alcohol abuse Father    Diabetes Mellitus II Father    Hyperlipidemia Other        4 siblings    Hypertension Other        3 siblings   Coronary artery disease Sister    Heart attack Brother     Social History   Tobacco Use   Smoking status: Never   Smokeless tobacco: Never  Vaping Use   Vaping Use: Never used  Substance Use Topics   Alcohol use: Never   Drug use: Never    Home Medications Prior to Admission medications   Medication Sig Start Date End Date Taking? Authorizing Provider  acetaminophen (TYLENOL) 500 MG tablet Take 500-1,000 mg by mouth every 6 (six) hours as needed (for pain).    [provider]  ascorbic acid (VITAMIN C) 500 MG tablet Take 500 mg by mouth  daily.    [provider]  doxycycline (VIBRA-TABS) 100 MG tablet Take 100 mg by mouth daily. 07/15/19   [provider]  Iron Combinations (IRON COMPLEX PO) Take by mouth.    [provider]  Omega-3 Fatty Acids (FISH OIL) 500 MG CAPS Take 500 mg by mouth daily.     [provider]  OVER THE COUNTER MEDICATION Take 1-2 capsules by mouth See admin instructions. Balance Of Nature Fruit And Vegetable Supplements  Take 4 capsules in the morning (2 Fruit + 2 Vegetable) & take 2 capsules in the evening (1 Fruit + 1 Vegetable)    [provider]    Allergies    Codeine  Review of Systems   Review of Systems  Unable to perform ROS: Acuity of condition   Physical Exam Updated Vital Signs BP (!) 155/72 (BP Location: Left Arm)   Pulse 99   Temp 99.6 F (37.6 C) (Oral)   Resp 20   SpO2 99%   Physical Exam Constitutional:      General: She is not in acute distress.    Appearance: Normal appearance.  HENT:     Head: Normocephalic.     Nose: Nose normal.  Eyes:      Extraocular Movements: Extraocular movements intact.  Cardiovascular:     Rate and Rhythm: Normal rate.  Pulmonary:     Effort: Pulmonary effort is normal.  Musculoskeletal:        General: Normal range of motion.     Cervical back: Normal range of motion.  Neurological:     Mental Status: She is alert.     Comments: Awake and alert.  Moving all extremities with normal gait.  Cranial nerves II through XII is intact.  Strength 5/5 all extremities.  Patient has expressive and receptive aphasia.  Does not understand instructions such as lift up your legs she would lift up her arm.  When I ask what her name is she responds with nonsensical sounds that are not words.  However when I mimic motions for her she is able to follow them.    ED Results / Procedures / Treatments   Labs (all labs ordered are listed, but only abnormal results are displayed) Labs Reviewed  CBC - Abnormal; Notable for the following components:      Result Value   Hemoglobin 10.5 (*)    HCT 33.7 (*)    MCV 77.1 (*)    MCH 24.0 (*)    Platelets 415 (*)    All other components within normal limits  COMPREHENSIVE METABOLIC PANEL - Abnormal; Notable for the following components:   Sodium 132 (*)    Chloride 96 (*)    Glucose, Bld 148 (*)    Albumin 3.2 (*)    Total Bilirubin 0.2 (*)    All other components within normal limits  CBG MONITORING, ED - Abnormal; Notable for the following components:   Glucose-Capillary 145 (*)    All other components within normal limits  RESP PANEL BY RT-PCR (FLU A&B, COVID) ARPGX2  ETHANOL  PROTIME-INR  APTT  DIFFERENTIAL  RAPID URINE DRUG SCREEN, HOSP PERFORMED  URINALYSIS, ROUTINE W REFLEX MICROSCOPIC    EKG None  Radiology CT HEAD CODE STROKE WO CONTRAST  Result Date: 10/06/2020 CLINICAL DATA:  Code stroke.  Acute neuro deficit. EXAM: CT HEAD WITHOUT CONTRAST TECHNIQUE: Contiguous axial images were obtained from the base of the skull through the vertex without  intravenous contrast. COMPARISON:  None. FINDINGS:  Brain: Ventricle size normal. Mild patchy white matter hypodensity bilaterally, likely chronic Negative for acute infarct, hemorrhage, mass Vascular: Negative for hyperdense vessel Skull: Negative Sinuses/Orbits: Paranasal sinuses clear.  Negative orbit Other: None ASPECTS (Ray Stroke Program Early CT Score) - Ganglionic level infarction (caudate, lentiform nuclei, internal capsule, insula, M1-M3 cortex): 7 - Supraganglionic infarction (M4-M6 cortex): 3 Total score (0-10 with 10 being normal): 10 IMPRESSION: 1. No acute abnormality 2. ASPECTS is 10 3. Mild white matter hypodensity likely due to chronic microvascular ischemia. 4. These results were called by telephone at the time of interpretation on 10/06/2020 at 6:55 pm to provider Sanford [place] , who verbally acknowledged these results. Electronically Signed   By: Franchot Gallo M.D.   On: 10/06/2020 18:55    Procedures .Critical Care  Date/Time: 10/06/2020 7:30 PM Performed by: Luna Fuse, MD Authorized by: Luna Fuse, MD   Critical care provider statement:    Critical care time (minutes):  30   Critical care time was exclusive of:  Separately billable procedures and treating other patients and teaching time   Critical care was necessary to treat or prevent imminent or life-threatening deterioration of the following conditions:  CNS failure or compromise Comments:     Concern for acute stroke   Medications Ordered in ED Medications  iohexol (OMNIPAQUE) 350 MG/ML injection 100 mL (100 mLs Intravenous Contrast Given 10/06/20 1922)    ED Course  I have reviewed the triage vital signs and the nursing notes.  Pertinent labs & imaging results that were available during my care of the patient were reviewed by me and considered in my medical decision making (see chart for details).    MDM Rules/Calculators/A&P                           Stroke alert was activated.  Last known well was  about 5 and half hours out.  She may be a candidate for neuro intervention although not a candidate for tPA.  Labs and imaging unremarkable.  Will be transferred for neuro intervention to [facility].   Final Clinical Impression(s) / ED Diagnoses Final diagnoses:  None    Rx / DC Orders ED Discharge Orders     None        Luna Fuse, MD 10/06/20 [phone]

## 2020-10-06 NOTE — ED Triage Notes (Addendum)
Brought in by family with altered mental status, family states , saw pt at 2pm today with confusion/ asphasia ,  last known normal ?

## 2020-10-06 NOTE — ED Notes (Signed)
MD notified of pt

## 2020-10-06 NOTE — Consult Note (Signed)
[facility] TeleNeurology Consult Services    Date of Service:  10/06/2020    Metrics: Last Known Well: 1:20 PM Symptoms: As per HPI.  Patient is not a candidate for thrombolytic. Out of the IV tPA time window.   Location of the provider: [facility]  Location of the patient: [place] Pre-Morbid Modified Rankin Scale: 0  This consult was provided via telemedicine with 2-way video and audio communication. The patient/family was informed that care would be provided in this way and agreed to receive care in this manner.   ED Physician notified of diagnostic impression and management plan at: 7:55 PM   Assessment: 65 year old female presenting with acute onset of expressive and receptive dysphasia  - Exam reveals mixed receptive and expressive dysphasia - NIHSS: 2 - CT head: No acute abnormality. ASPECTS is 10. Mild white matter hypodensity likely due to chronic microvascular ischemia. - EKG: Sinus rhythm - Out of the IV tPA time window - She is not on ASA, Plavix or a blood thinner at home.  - Stroke risk factors: Hypercholesterolemia.     Recommendations: - CTA of head and neck, with CTP, STAT (ordered) - MRI brain - TTE - Cardiac telemetry  - HgbA1c, fasting lipid panel  - PT consult, OT consult, Speech consult - Start atorvastatin 40 mg po qd - Prophylactic therapy- ASA 650 mg po crushed x 1, followed by 81 mg po qd - Frequent neuro checks - NPO until passes stroke swallow screen - Glycemic and fever control - Permissive HTN x 24 hours    ------------------------------------------------------------------------------   History of Present Illness: 65 year old female with a PMHx of complication from anesthesia (slow to wake up), hypercholesterolemia, melanoma to right hip and urinary incontinence who presents via son's vehicle for assessment of new onset speech deficit. LKN was 1:20 PM when granddaughter spoke to the patient. At 1:30  PM the patient "said an incorrect word" but granddaughter left the house at that time. Sometime between 1:30-5 PM, her daughter called and noted patient's speech to be abnormal. Daughter then called her brother and he rushed to the home. On arrival, his mother's speech was "completely garbled" and "not making any sense". She could not recognize her son. She also could not follow instructions, giving her son a "blank stare" in response. He then drove her to the [facility]. RN noted expressive aphasia on arrival at approximately [place]. Code Stroke was called.   Vitals on arrival: BP 155/72, HR 99, CBG 145.   The patient endorses receiving a Tdap vaccine in the past week.   Denies any recent head trauma, major surgery, MI, bleeding or anticoagulant use. She has no prior history of MI or CVA.   Of note, her son did not notice any limb weakness or facial droop. Also no jerking or twitching endorsed by family. Patient currently has poor recollection of the events other than she was confused at home and was brought to the hospital by her son.   Past Medical History: Past Medical History:  Diagnosis Date   Chicken pox    Complication of anesthesia    Per patient, very slow to wake up after anesthesia   High cholesterol    Melanoma ([place]) 2021   right hip   PONV (postoperative nausea and vomiting)    Urinary incontinence      Past Surgical History: Past Surgical History:  Procedure Laterality Date   [place]  WITH SENTINEL LYMPH NODE BIOPSY Right 10/09/2019   Procedure: WIDE LOCAL EXCISION WITH ADVANCEMENT FLAP CLOSURE RIGHT HIP MELANOMA WITH SENTINEL LYMPH NODE BIOPSY;  Surgeon: Stark Klein, MD;  Location: [facility];  Service: General;  Laterality: Right;   HERNIA REPAIR  2009   MOUTH SURGERY  11/2015   gum surgery and bone implant   WISDOM TOOTH EXTRACTION       Medications:  No current facility-administered medications on file prior to encounter.   Current  Outpatient Medications on File Prior to Encounter  Medication Sig Dispense Refill   acetaminophen (TYLENOL) 500 MG tablet Take 500-1,000 mg by mouth every 6 (six) hours as needed (for pain).     ascorbic acid (VITAMIN C) 500 MG tablet Take 500 mg by mouth daily.     doxycycline (VIBRA-TABS) 100 MG tablet Take 100 mg by mouth daily.     Iron Combinations (IRON COMPLEX PO) Take by mouth.     Omega-3 Fatty Acids (FISH OIL) 500 MG CAPS Take 500 mg by mouth daily.      OVER THE COUNTER MEDICATION Take 1-2 capsules by mouth See admin instructions. Balance Of Nature Fruit And Vegetable Supplements  Take 4 capsules in the morning (2 Fruit + 2 Vegetable) & take 2 capsules in the evening (1 Fruit + 1 Vegetable)        Social History: No history of EtOH use No history of drug use Never smoker   Family History:  CAD DM  HLD HTN   ROS: As per HPI    Anticoagulant use:  No   Antiplatelet use: No   Examination:   BP (!) 155/72 (BP Location: Left Arm)   Pulse 99   Temp 99.6 F (37.6 C) (Oral)   Resp 20   SpO2 99%    1A: Level of Consciousness - 0 1B: Ask Month and Age - 1 1C: Blink Eyes & Squeeze Hands - 0 2: Test Horizontal Extraocular Movements - 0 3: Test Visual Fields - 0 4: Test Facial Palsy (Use Grimace if Obtunded) - 0 5A: Test Left Arm Motor Drift - 0 5B: Test Right Arm Motor Drift - 0 6A: Test Left Leg Motor Drift - 0 6B: Test Right Leg Motor Drift - 0 7: Test Limb Ataxia (FNF/Heel-Shin) - 0 8: Test Sensation -  0 9: Test Language/Aphasia - 1 10: Test Dysarthria - Severe Dysarthria: 0 11: Test Extinction/Inattention - Extinction to bilateral simultaneous stimulation 0   NIHSS Score: 2     Patient/Family was informed the Neurology Consult would occur via TeleHealth consult by way of interactive audio and video telecommunications and consented to receiving care in this manner.   Patient is being evaluated for possible acute neurologic impairment and high pretest  probability of imminent or life-threatening deterioration. I spent total of 40 minutes providing care to this patient, including time for face to face visit via telemedicine, review of medical records, imaging studies and discussion of findings with providers, the patient and/or family.   Electronically signed: Dr. Kerney Elbe

## 2020-10-07 ENCOUNTER — Observation Stay (HOSPITAL_BASED_OUTPATIENT_CLINIC_OR_DEPARTMENT_OTHER): Payer: Medicare Other

## 2020-10-07 ENCOUNTER — Observation Stay (HOSPITAL_COMMUNITY): Payer: Medicare Other

## 2020-10-07 ENCOUNTER — Encounter (HOSPITAL_COMMUNITY): Payer: Self-pay | Admitting: Internal Medicine

## 2020-10-07 DIAGNOSIS — Y900 Blood alcohol level of less than 20 mg/100 ml: Secondary | ICD-10-CM | POA: Diagnosis not present

## 2020-10-07 DIAGNOSIS — G459 Transient cerebral ischemic attack, unspecified: Secondary | ICD-10-CM

## 2020-10-07 DIAGNOSIS — R4701 Aphasia: Secondary | ICD-10-CM | POA: Diagnosis not present

## 2020-10-07 DIAGNOSIS — D509 Iron deficiency anemia, unspecified: Secondary | ICD-10-CM | POA: Diagnosis not present

## 2020-10-07 DIAGNOSIS — R479 Unspecified speech disturbances: Secondary | ICD-10-CM | POA: Diagnosis present

## 2020-10-07 DIAGNOSIS — R4789 Other speech disturbances: Secondary | ICD-10-CM | POA: Diagnosis not present

## 2020-10-07 DIAGNOSIS — I619 Nontraumatic intracerebral hemorrhage, unspecified: Secondary | ICD-10-CM | POA: Diagnosis not present

## 2020-10-07 DIAGNOSIS — Z20822 Contact with and (suspected) exposure to covid-19: Secondary | ICD-10-CM | POA: Diagnosis not present

## 2020-10-07 DIAGNOSIS — Z79899 Other long term (current) drug therapy: Secondary | ICD-10-CM | POA: Diagnosis not present

## 2020-10-07 LAB — ECHOCARDIOGRAM COMPLETE
Area-P 1/2: 2.85 cm2
Calc EF: 61.4 %
Height: 65.984 in
S' Lateral: 3 cm
Single Plane A2C EF: 60.7 %
Single Plane A4C EF: 60.6 %
Weight: 2259.2 oz

## 2020-10-07 LAB — HIV ANTIBODY (ROUTINE TESTING W REFLEX): HIV Screen 4th Generation wRfx: NONREACTIVE

## 2020-10-07 LAB — HEMOGLOBIN A1C
Hgb A1c MFr Bld: 6 % — ABNORMAL HIGH (ref 4.8–5.6)
Mean Plasma Glucose: 125.5 mg/dL

## 2020-10-07 LAB — LIPID PANEL
Cholesterol: 134 mg/dL (ref 0–200)
HDL: 44 mg/dL (ref >40)
LDL Cholesterol: 83 mg/dL (ref 0–99)
Total CHOL/HDL Ratio: 3 RATIO
Triglycerides: 36 mg/dL (ref <150)
VLDL: 7 mg/dL (ref 0–40)

## 2020-10-07 IMAGING — MR MR HEAD W/O CM
12 of 13 series · 44 of 48 positions shown · non-contrast
Comparison: CT head, CTA head and neck yesterday.

CLINICAL DATA: 65-year-old female with sudden onset abnormal speech
at [NX] hours yesterday. Clinically improved.

EXAM:
MRI HEAD WITHOUT CONTRAST
TECHNIQUE: Multiplanar, multiecho pulse sequences of the brain and surrounding
structures were obtained without intravenous contrast.

[Series 5: DWI · axial · 3.0mm · 0.88mm/px · z∈[-74,+73]mm · 8 of 104 slices shown (1 of 4)]
[im 1/104]
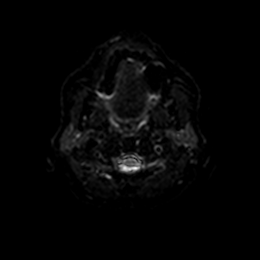
[im 15/104]
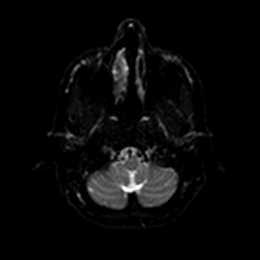
[im 30/104]
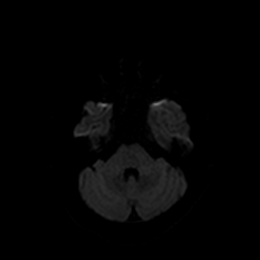
[im 45/104]
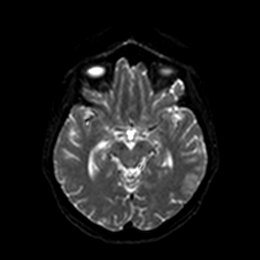
[im 59/104]
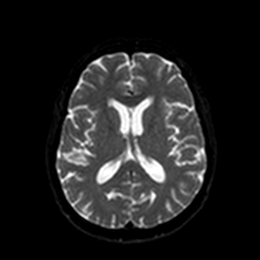
[im 74/104]
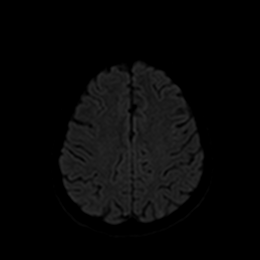
[im 89/104]
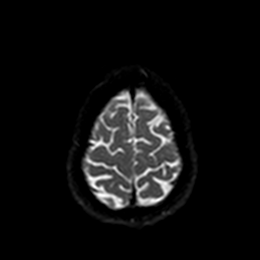
[im 104/104]
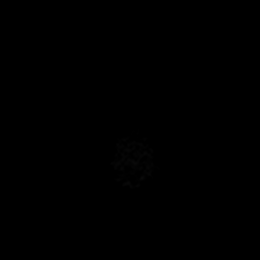

[Series 6: DWI · axial · 3.0mm · 0.88mm/px · z∈[-74,+73]mm · 4 of 52 slices shown (2 of 4)]
[im 1/52]
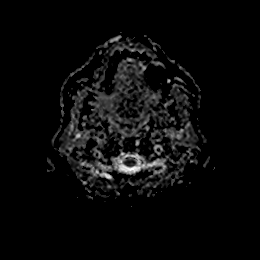
[im 18/52]
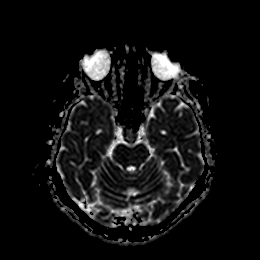
[im 35/52]
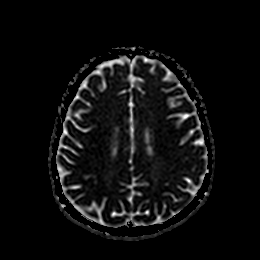
[im 52/52]
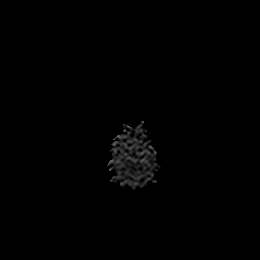

[Series 7: DWI · coronal · 4.0mm · 0.88mm/px · 5 of 68 slices shown (3 of 4)]
[im 1/68]
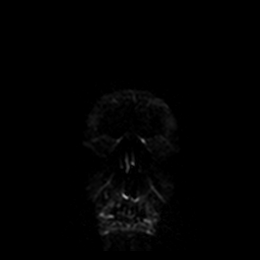
[im 17/68]
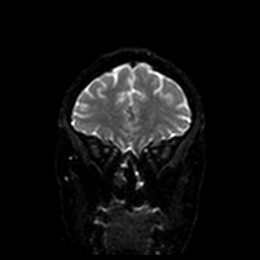
[im 34/68]
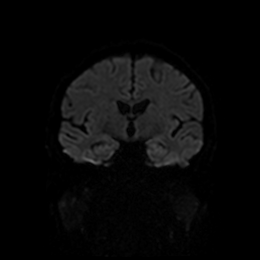
[im 51/68]
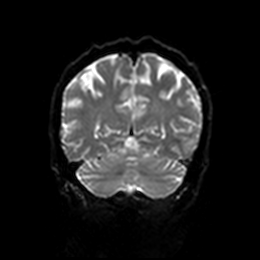
[im 68/68]
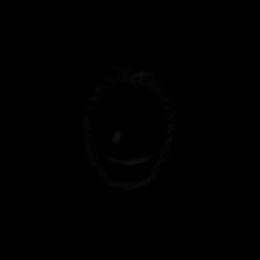

[Series 8: DWI · coronal · 4.0mm · 0.88mm/px · 3 of 33 slices shown (4 of 4)]
[im 1/33]
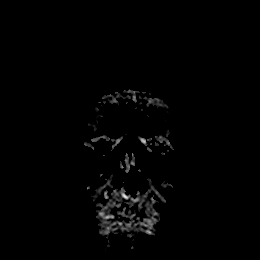
[im 17/33]
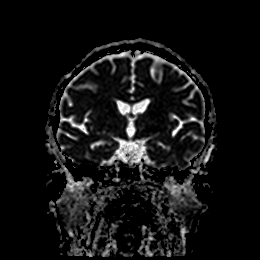
[im 33/33]
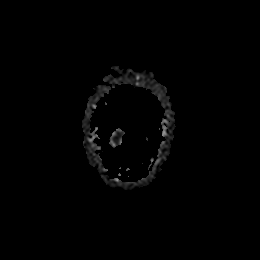

[Series 9: T1 · sagittal · 5.0mm · 0.75mm/px · 2 of 25 slices shown]
[im 1/25]
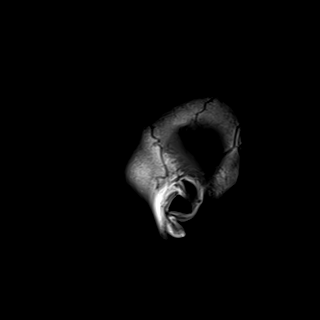
[im 25/25]
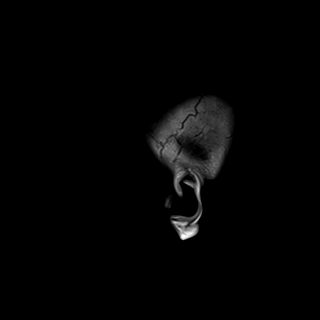

[Series 10: T2 · axial · 5.0mm · 0.72mm/px · z∈[-69,+69]mm · 2 of 25 slices shown (1 of 2)]
[im 1/25]
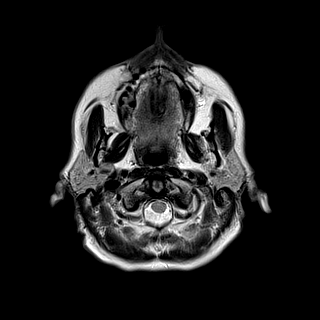
[im 25/25]
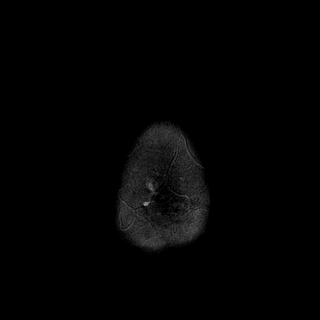

[Series 11: FLAIR · axial · 5.0mm · 0.45mm/px · z∈[-69,+70]mm · 2 of 25 slices shown]
[im 1/25]
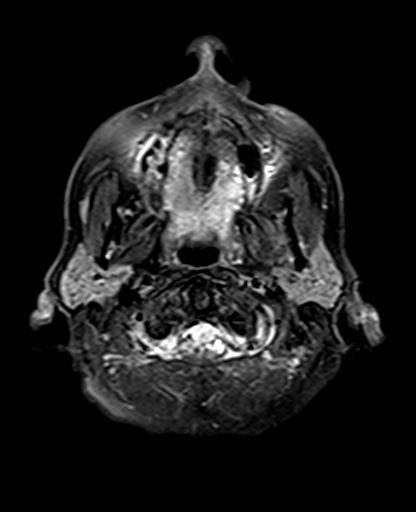
[im 25/25]
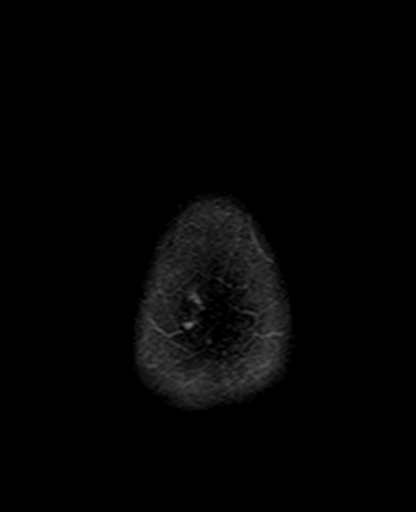

[Series 12: mag_images · axial · 3.0mm · 0.90mm/px · z∈[-73,+74]mm · 4 of 52 slices shown]
[im 1/52]
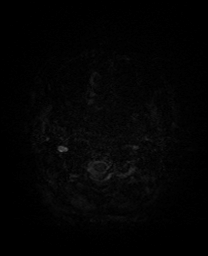
[im 18/52]
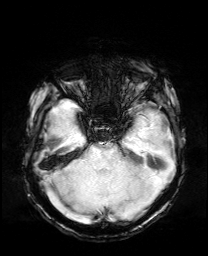
[im 35/52]
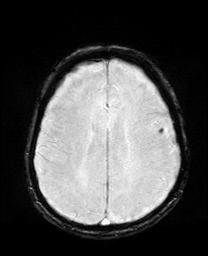
[im 52/52]
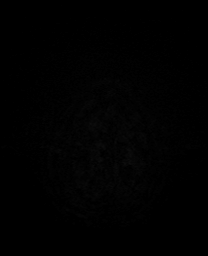

[Series 13: pha_images · axial · 3.0mm · 0.90mm/px · z∈[-70,+71]mm · 4 of 49 slices shown]
[im 1/49]
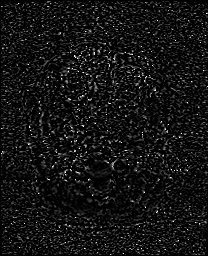
[im 17/49]
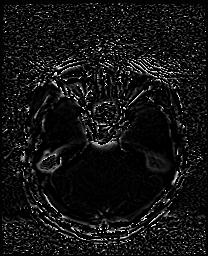
[im 33/49]
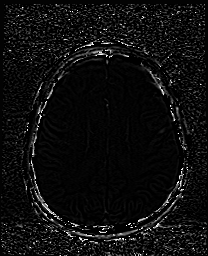
[im 49/49]
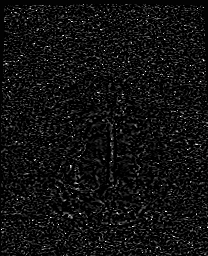

[Series 14: swi_images · axial · 3.0mm · 0.90mm/px · z∈[-73,+74]mm · 4 of 52 slices shown]
[im 1/52]
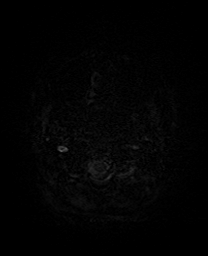
[im 18/52]
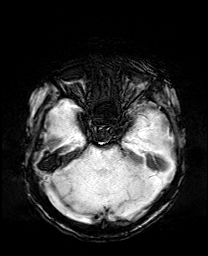
[im 35/52]
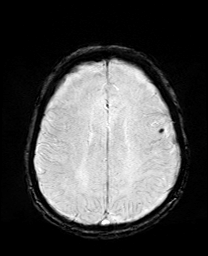
[im 52/52]
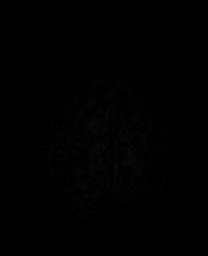

[Series 15: mip_images(sw) · axial · 24.0mm · 0.90mm/px · z∈[-63,+64]mm · 4 of 45 slices shown]
[im 1/45]
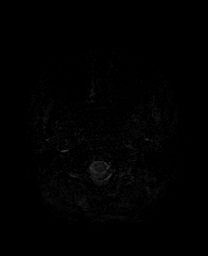
[im 15/45]
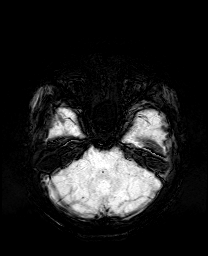
[im 30/45]
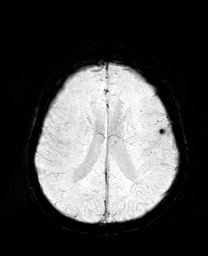
[im 45/45]
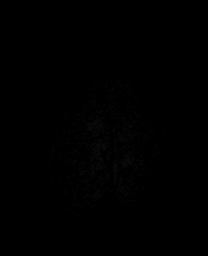

[Series 17: T2 · coronal · 5.0mm · 0.34mm/px · 2 of 29 slices shown (2 of 2)]
[im 1/29]
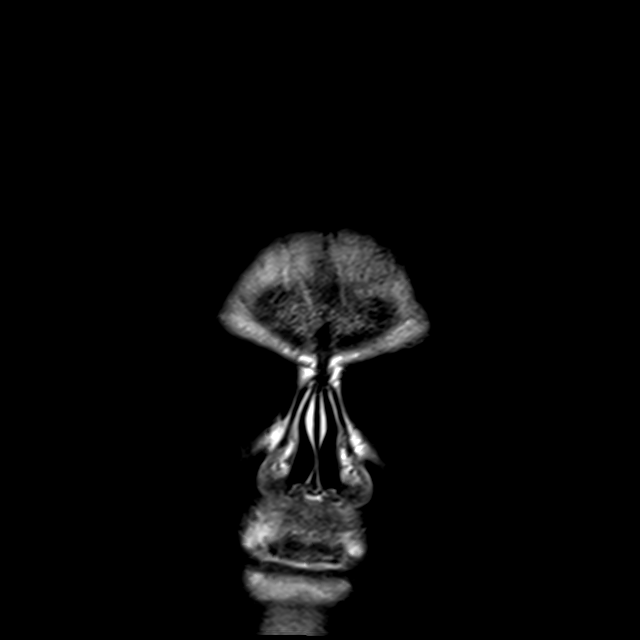
[im 29/29]
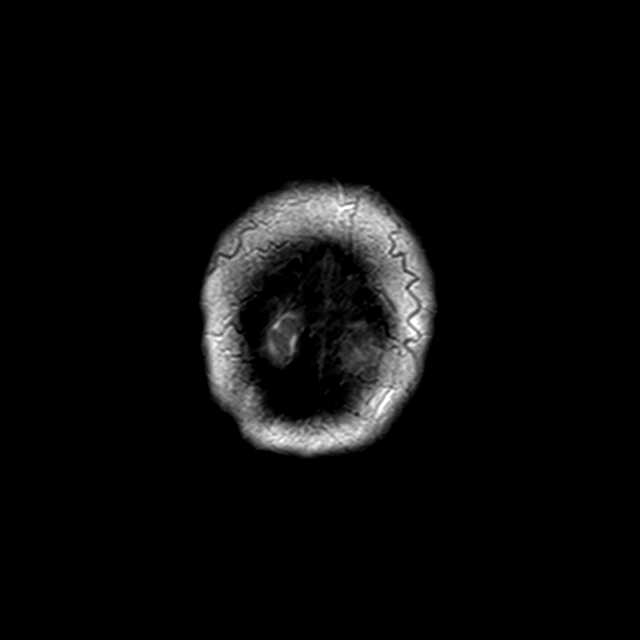

[44 of 48 positions shown; findings below may reference images not displayed]

FINDINGS: Brain: No restricted diffusion to suggest acute infarction. No
midline shift, mass effect, evidence of mass lesion,
ventriculomegaly, extra-axial collection or acute intracranial
hemorrhage. Cervicomedullary junction and pituitary are within
normal limits.

There is a solitary small focus of hemosiderin at the left frontal
operculum (series 14, image 36) with no corresponding T2 or FLAIR
signal abnormality. No other chronic cerebral blood products
identified, and otherwise gray and white matter signal is within
normal limits for age throughout the brain. No cortical
encephalomalacia.

Vascular: Major intracranial vascular flow voids are preserved.

Skull and upper cervical spine: Negative for age visible cervical
spine. Visualized bone marrow signal is within normal limits.

Sinuses/Orbits: Negative orbits. Paranasal Visualized paranasal
sinuses and mastoids are stable and well aerated.

Other: Visible internal auditory structures appear normal. Negative
visible scalp and face.
IMPRESSION: 1. No acute intracranial abnormality.
2. Solitary chronic micro-hemorrhage in the left frontal operculum,
but otherwise normal for age noncontrast MRI appearance of the
brain.

## 2020-10-07 MED ORDER — LIP MEDEX EX OINT
1.0000 | TOPICAL_OINTMENT | CUTANEOUS | Status: DC | PRN
Start: 2020-10-07 — End: 2020-10-07
  Filled 2020-10-07: qty 7

## 2020-10-07 MED ORDER — STROKE: EARLY STAGES OF RECOVERY BOOK
Freq: Once | Status: AC
  Filled 2020-10-07: qty 1

## 2020-10-07 MED ORDER — FERROUS SULFATE 325 (65 FE) MG PO TABS
325.0000 mg | ORAL_TABLET | Freq: Two times a day (BID) | ORAL | Status: DC
  Administered 2020-10-07 (×2): 325 mg via ORAL
  Filled 2020-10-07 (×2): qty 1

## 2020-10-07 MED ORDER — PRAVASTATIN SODIUM 20 MG PO TABS: 20.0000 mg | ORAL_TABLET | Freq: Every evening | ORAL | 1 refills | Status: DC

## 2020-10-07 MED ORDER — CLOPIDOGREL BISULFATE 75 MG PO TABS: 75.0000 mg | ORAL_TABLET | Freq: Every day | ORAL | 0 refills | Status: AC

## 2020-10-07 MED ORDER — SENNOSIDES-DOCUSATE SODIUM 8.6-50 MG PO TABS: 1.0000 | ORAL_TABLET | Freq: Every evening | ORAL | Status: DC | PRN

## 2020-10-07 MED ORDER — ASPIRIN EC 81 MG PO TBEC
81.0000 mg | DELAYED_RELEASE_TABLET | Freq: Every day | ORAL | Status: DC
  Administered 2020-10-07: 81 mg via ORAL
  Filled 2020-10-07: qty 1

## 2020-10-07 MED ORDER — ACETAMINOPHEN 650 MG RE SUPP: 650.0000 mg | RECTAL | Status: DC | PRN

## 2020-10-07 MED ORDER — ACETAMINOPHEN 160 MG/5ML PO SOLN: 650.0000 mg | ORAL | Status: DC | PRN

## 2020-10-07 MED ORDER — ACETAMINOPHEN 325 MG PO TABS: 650.0000 mg | ORAL_TABLET | ORAL | Status: DC | PRN

## 2020-10-07 MED ORDER — ATORVASTATIN CALCIUM 40 MG PO TABS
40.0000 mg | ORAL_TABLET | Freq: Every day | ORAL | Status: DC
  Administered 2020-10-07: 40 mg via ORAL
  Filled 2020-10-07: qty 1

## 2020-10-07 MED ORDER — ASPIRIN 81 MG PO TBEC: 81.0000 mg | DELAYED_RELEASE_TABLET | Freq: Every day | ORAL | 3 refills | Status: AC

## 2020-10-07 NOTE — Progress Notes (Addendum)
STROKE TEAM PROGRESS NOTE   SUBJECTIVE (INTERVAL HISTORY) Her son is at the bedside.  Overall her condition is completely resolved. Per pt she was talking to her granddaughter and had sudden onset speech difficulty with words out. She drove home and confused not able to read text messages. When on call, she had garbled speech. Son came over and she started at him and was not able to recognize him, but denies any seizure activities. Did admit very mild HA but denies any hx of migraine. The symptoms resolved in 5-6 hours. Currently back to baseline.    OBJECTIVE Temp:  [98 F (36.7 C)-99.6 F (37.6 C)] 99.4 F (37.4 C) (08/11 1445) Pulse Rate:  [71-99] 72 (08/11 1445) Cardiac Rhythm: Normal sinus rhythm (08/11 0817) Resp:  [13-20] 16 (08/11 1445) BP: (101-155)/(55-90) 101/56 (08/11 1445) SpO2:  [94 %-100 %] 94 % (08/11 1050) Weight:  [64 kg] 64 kg (08/11 0206)  Recent Labs  Lab 10/06/20 1826  GLUCAP 145*   Recent Labs  Lab 10/06/20 1844  NA 132*  K 3.9  CL 96*  CO2 27  GLUCOSE 148*  BUN 10  CREATININE 0.59  CALCIUM 9.4   Recent Labs  Lab 10/06/20 1844  AST 23  ALT 21  ALKPHOS 74  BILITOT 0.2*  PROT 8.1  ALBUMIN 3.2*   Recent Labs  Lab 10/06/20 1844  WBC 7.0  NEUTROABS 5.3  HGB 10.5*  HCT 33.7*  MCV 77.1*  PLT 415*   No results for input(s): CKTOTAL, CKMB, CKMBINDEX, TROPONINI in the last 168 hours. Recent Labs    10/06/20 1844  LABPROT 13.9  INR 1.1   Recent Labs    10/06/20 1844  COLORURINE YELLOW  LABSPEC <1.005*  PHURINE 7.0  GLUCOSEU NEGATIVE  HGBUR TRACE*  BILIRUBINUR NEGATIVE  KETONESUR NEGATIVE  PROTEINUR NEGATIVE  NITRITE NEGATIVE  LEUKOCYTESUR NEGATIVE       Component Value Date/Time   CHOL 134 10/07/2020 0332   TRIG 36 10/07/2020 0332   HDL 44 10/07/2020 0332   CHOLHDL 3.0 10/07/2020 0332   VLDL 7 10/07/2020 0332   LDLCALC 83 10/07/2020 0332   Lab Results  Component Value Date   HGBA1C 6.0 (H) 10/07/2020      Component  Value Date/Time   LABOPIA NONE DETECTED 10/06/2020 1844   COCAINSCRNUR NONE DETECTED 10/06/2020 1844   LABBENZ NONE DETECTED 10/06/2020 1844   AMPHETMU NONE DETECTED 10/06/2020 1844   THCU NONE DETECTED 10/06/2020 1844   LABBARB NONE DETECTED 10/06/2020 1844    Recent Labs  Lab 10/06/20 1844  ETH <10    I have personally reviewed the radiological images below and agree with the radiology interpretations.  MR BRAIN WO CONTRAST  Result Date: 10/07/2020 CLINICAL DATA:  65 year old female with sudden onset abnormal speech at 1330 hours yesterday. Clinically improved. EXAM: MRI HEAD WITHOUT CONTRAST TECHNIQUE: Multiplanar, multiecho pulse sequences of the brain and surrounding structures were obtained without intravenous contrast. COMPARISON:  CT head, CTA head and neck yesterday. FINDINGS: Brain: No restricted diffusion to suggest acute infarction. No midline shift, mass effect, evidence of mass lesion, ventriculomegaly, extra-axial collection or acute intracranial hemorrhage. Cervicomedullary junction and pituitary are within normal limits. There is a solitary small focus of hemosiderin at the left frontal operculum (series 14, image 36) with no corresponding T2 or FLAIR signal abnormality. No other chronic cerebral blood products identified, and otherwise gray and white matter signal is within normal limits for age throughout the brain. No cortical encephalomalacia. Vascular: Major  intracranial vascular flow voids are preserved. Skull and upper cervical spine: Negative for age visible cervical spine. Visualized bone marrow signal is within normal limits. Sinuses/Orbits: Negative orbits. Paranasal Visualized paranasal sinuses and mastoids are stable and well aerated. Other: Visible internal auditory structures appear normal. Negative visible scalp and face. IMPRESSION: 1. No acute intracranial abnormality. 2. Solitary chronic micro-hemorrhage in the left frontal operculum, but otherwise normal for age  noncontrast MRI appearance of the brain. Electronically Signed   By: Genevie Ann M.D.   On: 10/07/2020 04:41   EEG adult  Result Date: 10/07/2020 Lora Havens, MD     10/07/2020  2:45 PM Patient Name: Ariel [name] MRN: [id number] Epilepsy Attending: Lora Havens Referring Physician/Provider: Dr Rosalin Hawking Date: 10/07/2020 Duration: 20.11 mins Patient history: 65 year old female presented with incomprehensible speech.  EEG to evaluate for seizures. Level of alertness: Awake, asleep AEDs during EEG study: None Technical aspects: This EEG study was done with scalp electrodes positioned according to the 10-20 International system of electrode placement. Electrical activity was acquired at a sampling rate of '500Hz'$  and reviewed with a high frequency filter of '70Hz'$  and a low frequency filter of '1Hz'$ . EEG data were recorded continuously and digitally stored. Description: The posterior dominant rhythm consists of 9-10 Hz activity of moderate voltage (25-35 uV) seen predominantly in posterior head regions, symmetric and reactive to eye opening and eye closing. Sleep was characterized by vertex waves, sleep spindles (12 to 14 Hz), maximal frontocentral region.  EEG showed intermittent left frontotemporal theta-delta slowing. Physiologic photic driving was seen during photic stimulation.  Hyperventilation was not performed.   ABNORMALITY - Intermittent slow, left frontotemporal region IMPRESSION: This study is suggestive of cortical dysfunction in left frontotemporal region, nonspecific etiology. No seizures or epileptiform discharges were seen throughout the recording. Lora Havens   ECHOCARDIOGRAM COMPLETE  Result Date: 10/07/2020    ECHOCARDIOGRAM REPORT   Patient Name:   Ariel [name] Date of Exam: 10/07/2020 Medical Rec #:  [id number]    Height:       66.0 in Accession #:    [id number]   Weight:       141.2 lb Date of Birth:  06-Jan-1956    BSA:          1.725 m Patient Age:    65 years     BP:           108/62  mmHg Patient Gender: F            HR:           72 bpm. Exam Location:  Inpatient Procedure: 2D Echo, Cardiac Doppler and Color Doppler Indications:    TIA G45.9  History:        Patient has no prior history of Echocardiogram examinations.                 Risk Factors:Dyslipidemia and Non-Smoker.  Sonographer:    Vickie Epley RDCS Referring Phys: [id number] [place]  1. Left ventricular ejection fraction, by estimation, is 60 to 65%. The left ventricle has normal function. The left ventricle has no regional wall motion abnormalities. Left ventricular diastolic parameters are consistent with Grade I diastolic dysfunction (impaired relaxation).  2. Right ventricular systolic function is normal. The right ventricular size is normal. There is normal pulmonary artery systolic pressure.  3. The mitral valve is grossly normal. Trivial mitral valve regurgitation.  4. The aortic valve is tricuspid. Aortic valve regurgitation is not visualized.  5. The inferior vena cava is normal in size with greater than 50% respiratory variability, suggesting right atrial pressure of 3 mmHg. Comparison(s): No prior Echocardiogram. Conclusion(s)/Recommendation(s): No intracardiac source of embolism detected on this transthoracic study. A transesophageal echocardiogram is recommended to exclude cardiac source of embolism if clinically indicated. FINDINGS  Left Ventricle: Left ventricular ejection fraction, by estimation, is 60 to 65%. The left ventricle has normal function. The left ventricle has no regional wall motion abnormalities. The left ventricular internal cavity size was normal in size. There is  no left ventricular hypertrophy. Left ventricular diastolic parameters are consistent with Grade I diastolic dysfunction (impaired relaxation). Indeterminate filling pressures. Right Ventricle: The right ventricular size is normal. No increase in right ventricular wall thickness. Right ventricular systolic function is  normal. There is normal pulmonary artery systolic pressure. The tricuspid regurgitant velocity is 2.36 m/s, and  with an assumed right atrial pressure of 3 mmHg, the estimated right ventricular systolic pressure is [id number] mmHg. Left Atrium: Left atrial size was normal in size. Right Atrium: Right atrial size was normal in size. Pericardium: There is no evidence of pericardial effusion. Mitral Valve: The mitral valve is grossly normal. Trivial mitral valve regurgitation. Tricuspid Valve: The tricuspid valve is grossly normal. Tricuspid valve regurgitation is trivial. Aortic Valve: The aortic valve is tricuspid. Aortic valve regurgitation is not visualized. Pulmonic Valve: The pulmonic valve was normal in structure. Pulmonic valve regurgitation is not visualized. Aorta: The aortic root and ascending aorta are structurally normal, with no evidence of dilitation. Venous: The inferior vena cava is normal in size with greater than 50% respiratory variability, suggesting right atrial pressure of 3 mmHg. IAS/Shunts: No atrial level shunt detected by color flow Doppler.  LEFT VENTRICLE PLAX 2D LVIDd:         4.50 cm     Diastology LVIDs:         3.00 cm     LV e' medial:    8.92 cm/s LV PW:         0.80 cm     LV E/e' medial:  7.3 LV IVS:        0.80 cm     LV e' lateral:   9.36 cm/s LVOT diam:     1.80 cm     LV E/e' lateral: 7.0 LV SV:         60 LV SV Index:   35 LVOT Area:     2.54 cm  LV Volumes (MOD) LV vol d, MOD A2C: 90.4 ml LV vol d, MOD A4C: 83.5 ml LV vol s, MOD A2C: 35.5 ml LV vol s, MOD A4C: 32.9 ml LV SV MOD A2C:     54.9 ml LV SV MOD A4C:     83.5 ml LV SV MOD BP:      55.3 ml RIGHT VENTRICLE RV S prime:     15.20 cm/s TAPSE (M-mode): 2.2 cm LEFT ATRIUM             Index       RIGHT ATRIUM           Index LA diam:        3.50 cm 2.03 cm/m  RA Area:     13.30 cm LA Vol (A2C):   34.6 ml 20.06 ml/m RA Volume:   31.60 ml  18.32 ml/m LA Vol (A4C):   53.9 ml 31.25 ml/m LA Biplane Vol: 45.7 ml 26.50 ml/m  AORTIC  VALVE LVOT Vmax:   115.00  cm/s LVOT Vmean:  77.100 cm/s LVOT VTI:    0.234 m  AORTA Ao Root diam: 2.90 cm Ao Asc diam:  3.20 cm MITRAL VALVE               TRICUSPID VALVE MV Area (PHT): 2.85 cm    TR Peak grad:   22.3 mmHg MV Decel Time: 266 msec    TR Vmax:        236.00 cm/s MV E velocity: 65.10 cm/s MV A velocity: 47.60 cm/s  SHUNTS MV E/A ratio:  1.37        Systemic VTI:  0.23 m                            Systemic Diam: 1.80 cm Lyman Bishop MD Electronically signed by Lyman Bishop MD Signature Date/Time: 10/07/2020/1:10:53 PM    Final    CT HEAD CODE STROKE WO CONTRAST  Result Date: 10/06/2020 CLINICAL DATA:  Code stroke.  Acute neuro deficit. EXAM: CT HEAD WITHOUT CONTRAST TECHNIQUE: Contiguous axial images were obtained from the base of the skull through the vertex without intravenous contrast. COMPARISON:  None. FINDINGS: Brain: Ventricle size normal. Mild patchy white matter hypodensity bilaterally, likely chronic Negative for acute infarct, hemorrhage, mass Vascular: Negative for hyperdense vessel Skull: Negative Sinuses/Orbits: Paranasal sinuses clear.  Negative orbit Other: None ASPECTS ([place] Stroke Program Early CT Score) - Ganglionic level infarction (caudate, lentiform nuclei, internal capsule, insula, M1-M3 cortex): 7 - Supraganglionic infarction (M4-M6 cortex): 3 Total score (0-10 with 10 being normal): 10 IMPRESSION: 1. No acute abnormality 2. ASPECTS is 10 3. Mild white matter hypodensity likely due to chronic microvascular ischemia. 4. These results were called by telephone at the time of interpretation on 10/06/2020 at 6:55 pm to provider [facility] , who verbally acknowledged these results. Electronically Signed   By: Franchot Gallo M.D.   On: 10/06/2020 18:55   CT ANGIO HEAD CODE STROKE  Result Date: 10/06/2020 CLINICAL DATA:  Neuro deficit, acute, stroke suspected EXAM: CT ANGIOGRAPHY HEAD AND NECK TECHNIQUE: Multidetector CT imaging of the head and neck was performed using the  standard protocol during bolus administration of intravenous contrast. Multiplanar CT image reconstructions and MIPs were obtained to evaluate the vascular anatomy. Carotid stenosis measurements (when applicable) are obtained utilizing NASCET criteria, using the distal internal carotid diameter as the denominator. CONTRAST:  133m OMNIPAQUE IOHEXOL 350 MG/ML SOLN COMPARISON:  Same day CT head. FINDINGS: CTA NECK FINDINGS Aortic arch: Great vessel origins are patent. Right carotid system: No evidence of dissection, stenosis (50% or greater) or occlusion. Left carotid system: No evidence of dissection, stenosis (50% or greater) or occlusion. Vertebral arteries: Limited evaluation proximally due to extensive streak artifact from adjacent venous contrast Skeleton: Mild-to-moderate multilevel degenerative change. Other neck: Suspected narrowing of the left subclavian vein as it courses between the sternum and aorta at with reflux of venous contrast into small veins in the neck. No acute findings in the neck. The Upper chest: Mild dependent ground-glass opacities, likely atelectasis. Otherwise, lung apices are clear. Review of the MIP images confirms the above findings CTA HEAD FINDINGS Anterior circulation: Calcific atherosclerosis of bilateral intracranial ICAs with mild narrowing. Bilateral MCAs and ACAs are patent without proximal hemodynamically significant stenosis. No aneurysm identified. Posterior circulation: Bilateral intradural vertebral arteries, basilar artery, and posterior cerebral arteries are patent without proximal hemodynamically significant stenosis. Venous sinuses: As permitted by contrast timing, patent. Anatomic variants: None Review of  the MIP images confirms the above findings IMPRESSION: No large vessel occlusion or proximal hemodynamically significant stenosis in the head or neck. Findings discussed with Dr. Cheral Marker via telephone at 7:34 p.m. Electronically Signed   By: Margaretha Sheffield MD    On: 10/06/2020 19:47   CT ANGIO NECK CODE STROKE  Result Date: 10/06/2020 CLINICAL DATA:  Neuro deficit, acute, stroke suspected EXAM: CT ANGIOGRAPHY HEAD AND NECK TECHNIQUE: Multidetector CT imaging of the head and neck was performed using the standard protocol during bolus administration of intravenous contrast. Multiplanar CT image reconstructions and MIPs were obtained to evaluate the vascular anatomy. Carotid stenosis measurements (when applicable) are obtained utilizing NASCET criteria, using the distal internal carotid diameter as the denominator. CONTRAST:  110m OMNIPAQUE IOHEXOL 350 MG/ML SOLN COMPARISON:  Same day CT head. FINDINGS: CTA NECK FINDINGS Aortic arch: Great vessel origins are patent. Right carotid system: No evidence of dissection, stenosis (50% or greater) or occlusion. Left carotid system: No evidence of dissection, stenosis (50% or greater) or occlusion. Vertebral arteries: Limited evaluation proximally due to extensive streak artifact from adjacent venous contrast Skeleton: Mild-to-moderate multilevel degenerative change. Other neck: Suspected narrowing of the left subclavian vein as it courses between the sternum and aorta at with reflux of venous contrast into small veins in the neck. No acute findings in the neck. The Upper chest: Mild dependent ground-glass opacities, likely atelectasis. Otherwise, lung apices are clear. Review of the MIP images confirms the above findings CTA HEAD FINDINGS Anterior circulation: Calcific atherosclerosis of bilateral intracranial ICAs with mild narrowing. Bilateral MCAs and ACAs are patent without proximal hemodynamically significant stenosis. No aneurysm identified. Posterior circulation: Bilateral intradural vertebral arteries, basilar artery, and posterior cerebral arteries are patent without proximal hemodynamically significant stenosis. Venous sinuses: As permitted by contrast timing, patent. Anatomic variants: None Review of the MIP images  confirms the above findings IMPRESSION: No large vessel occlusion or proximal hemodynamically significant stenosis in the head or neck. Findings discussed with Dr. LCheral Markervia telephone at 7:34 p.m. Electronically Signed   By: FMargaretha SheffieldMD   On: 10/06/2020 19:47     PHYSICAL EXAM  Temp:  [98 F (36.7 C)-99.6 F (37.6 C)] 99.4 F (37.4 C) (08/11 1445) Pulse Rate:  [71-99] 72 (08/11 1445) Resp:  [13-20] 16 (08/11 1445) BP: (101-155)/(55-90) 101/56 (08/11 1445) SpO2:  [94 %-100 %] 94 % (08/11 1050) Weight:  [64 kg] 64 kg (08/11 0206)  General - Well nourished, well developed, in no apparent distress.  Ophthalmologic - fundi not visualized due to noncooperation.  Cardiovascular - Regular rhythm and rate.  Mental Status -  Level of arousal and orientation to time, place, and person were intact. Language including expression, naming, repetition, comprehension was assessed and found intact. Attention span and concentration were normal. Fund of Knowledge was assessed and was intact.  Cranial Nerves II - XII - II - Visual field intact OU. III, IV, VI - Extraocular movements intact. V - Facial sensation intact bilaterally. VII - Facial movement intact bilaterally. VIII - Hearing & vestibular intact bilaterally. X - Palate elevates symmetrically. XI - Chin turning & shoulder shrug intact bilaterally. XII - Tongue protrusion intact.  Motor Strength - The patient's strength was normal in all extremities and pronator drift was absent.  Bulk was normal and fasciculations were absent.   Motor Tone - Muscle tone was assessed at the neck and appendages and was normal.  Reflexes - The patient's reflexes were symmetrical in all extremities and she had  no pathological reflexes.  Sensory - Light touch, temperature/pinprick were assessed and were symmetrical.    Coordination - The patient had normal movements in the hands and feet with no ataxia or dysmetria.  Tremor was absent.  Gait  and Station - deferred.   ASSESSMENT/PLAN Ms. Prue Shehata is a 66 y.o. female with history of HLD admitted for episode of speech difficulty, confusion. No tPA given due to outside window.    TIA versus seizure versus migraine equivalent Resultant back to baseline CT no acute finding CT head and neck unremarkable MRI no acute infarct 2D Echo EF 60 to 65% Recommend 30 day cardiac event monitoring as outpt (cardiology aware) LDL 129 HgbA1c 6.0 UDS negative SCDs for VTE prophylaxis No antithrombotic prior to admission, now on aspirin 81 mg daily and clopidogrel 75 mg daily DAPT for 3 weeks and then aspirin alone Patient counseled to be compliant with her antithrombotic medications Ongoing aggressive stroke risk factor management Therapy recommendations: None Disposition: Home  Hyperlipidemia Home meds: None LDL 129, goal < 70 Now on pravastatin 20 given prior statin intolerance Continue statin at discharge  Other Stroke Risk Factors Advanced age  Other [facility] day # 0  Neurology will sign off. Please call with questions. Pt will follow up with Dr. Maryelizabeth Kaufmann in about 4 weeks. Thanks for the consult.   Rosalin Hawking, MD PhD Stroke Neurology 10/07/2020 5:03 PM    To contact Stroke Continuity provider, please refer to [email or web address] After hours, contact General Neurology

## 2020-10-07 NOTE — Progress Notes (Signed)
Pt discharged home. Instructions regarding TIA given and education on new medications provided. All questions and concerns answered.

## 2020-10-07 NOTE — Progress Notes (Signed)
PT Cancellation Note  Patient Details Name: Ariel [name] MRN: [id number] DOB: Jan 10, 1956   Cancelled Treatment:    Reason Eval/Treat Not Completed: Other (comment) attempted to see, MD currently with patient, will return as time/schedule allow.   Windell Norfolk, DPT, PN2   Supplemental Physical Therapist [place]    Pager [phone] Acute Rehab Office [phone]

## 2020-10-07 NOTE — Progress Notes (Signed)
SLP Cancellation Note  Patient Details Name: Shyah Sollers MRN: [id number] DOB: 1955-08-22   Cancelled evaluation: Pt screened, speech is back to normal. No formal eval indicated. Reviewed BE-FAST acronym. Will sign off.  Jefferie Holston L. Tivis Ringer, [place] Office number [phone] Pager [phone]           Juan Quam Laurice 10/07/2020, 4:09 PM

## 2020-10-07 NOTE — Progress Notes (Signed)
  Echocardiogram 2D Echocardiogram has been performed.  Michiel Cowboy 10/07/2020, 11:44 AM

## 2020-10-07 NOTE — Progress Notes (Signed)
EEG complete - results pending 

## 2020-10-07 NOTE — Evaluation (Signed)
Occupational Therapy Evaluation and Discharge Patient Details Name: Ariel [name] MRN: [id number] DOB: 11-21-1955 Today's Date: 10/07/2020    History of Present Illness Pt admitted on 10/06/20 with garbled speech, CT and MRI negative for acute changes, TIA suspected. PMH: iron deficiency anemia and rosacea.   Clinical Impression   Pt is functioning independently. Educated in s/s of stroke. Pt had already read pt information book regarding stroke. No further OT needs.    Follow Up Recommendations  No OT follow up    Equipment Recommendations  None recommended by OT    Recommendations for Other Services       Precautions / Restrictions Precautions Precautions: None      Mobility Bed Mobility Overal bed mobility: Modified Independent             General bed mobility comments: HOB up    Transfers Overall transfer level: Independent                    Balance Overall balance assessment: Independent                                         ADL either performed or assessed with clinical judgement   ADL Overall ADL's : Independent                                             Vision Baseline Vision/History: Wears glasses Wears Glasses: At all times Patient Visual Report: No change from baseline Additional Comments: pt reports she has had changes in visual acuity in the last few months and has sought evaluation for vision.     Perception     Praxis      Pertinent Vitals/Pain Pain Assessment: No/denies pain     Hand Dominance Right   Extremity/Trunk Assessment Upper Extremity Assessment Upper Extremity Assessment: Overall WFL for tasks assessed   Lower Extremity Assessment Lower Extremity Assessment: Defer to PT evaluation   Cervical / Trunk Assessment Cervical / Trunk Assessment: Normal   Communication Communication Communication: No difficulties (pt reports there her speech is not at baseline)   Cognition  Arousal/Alertness: Awake/alert Behavior During Therapy: WFL for tasks assessed/performed Overall Cognitive Status: Within Functional Limits for tasks assessed                                     General Comments       Exercises     Shoulder Instructions      Home Living Family/patient expects to be discharged to:: Private residence Living Arrangements: Alone Available Help at Discharge: Family;Available PRN/intermittently Type of Home: House             Bathroom Shower/Tub: Walk-in Corporate treasurer Toilet: Handicapped height                Prior Functioning/Environment Level of Independence: Independent                 OT Problem List:        OT Treatment/Interventions:      OT Goals(Current goals can be found in the care plan section)    OT Frequency:     Barriers to D/C:  Co-evaluation              AM-PAC OT "6 Clicks" Daily Activity     Outcome Measure Help from another person eating meals?: None Help from another person taking care of personal grooming?: None Help from another person toileting, which includes using toliet, bedpan, or urinal?: None Help from another person bathing (including washing, rinsing, drying)?: None Help from another person to put on and taking off regular upper body clothing?: None Help from another person to put on and taking off regular lower body clothing?: None 6 Click Score: 24   End of Session    Activity Tolerance: Patient tolerated treatment well Patient left: in chair;with call bell/phone within reach  OT Visit Diagnosis: Muscle weakness (generalized) (M62.81)                Time: 1039-1100 OT Time Calculation (min): 21 min Charges:  OT General Charges $OT Visit: 1 Visit OT Evaluation $OT Eval Low Complexity: 1 Low  Nestor Lewandowsky, OTR/L Acute Rehabilitation Services Pager: [phone] Office: [phone]  Ariel [name] 10/07/2020, 11:29 AM

## 2020-10-07 NOTE — Procedures (Signed)
Patient Name: Ariel [name]  MRN: [id number]  Epilepsy Attending: Lora Havens  Referring Physician/Provider: Dr Rosalin Hawking Date: 10/07/2020 Duration: 20.11 mins  Patient history: 65 year old female presented with incomprehensible speech.  EEG to evaluate for seizures.  Level of alertness: Awake, asleep  AEDs during EEG study: None  Technical aspects: This EEG study was done with scalp electrodes positioned according to the 10-20 International system of electrode placement. Electrical activity was acquired at a sampling rate of '500Hz'$  and reviewed with a high frequency filter of '70Hz'$  and a low frequency filter of '1Hz'$ . EEG data were recorded continuously and digitally stored.   Description: The posterior dominant rhythm consists of 9-10 Hz activity of moderate voltage (25-35 uV) seen predominantly in posterior head regions, symmetric and reactive to eye opening and eye closing. Sleep was characterized by vertex waves, sleep spindles (12 to 14 Hz), maximal frontocentral region.  EEG showed intermittent left frontotemporal theta-delta slowing. Physiologic photic driving was seen during photic stimulation.  Hyperventilation was not performed.     ABNORMALITY - Intermittent slow, left frontotemporal region  IMPRESSION: This study is suggestive of cortical dysfunction in left frontotemporal region, nonspecific etiology. No seizures or epileptiform discharges were seen throughout the recording.  [name]

## 2020-10-07 NOTE — H&P (Signed)
History and Physical    Ariel [name] [id number] DOB: January 03, 1956 DOA: 10/06/2020  PCP: [name], NP   Patient coming from: Home  Chief Complaint: Difficulty speaking  HPI: Ariel [name] is a 65 y.o. female with medical history significant for Iron deficiency anemia, rosacea who presents for evaluation of incomprehensible speech.  She reports she had lunch with her granddaughter and when she dropped her off and was getting ready to leave she noticed that she was having difficulty finding the right words to say and speaking them.  This was around 120 in the afternoon on August 10.  She drove herself home and she had no dizziness, chest pain, shortness of breath and she otherwise felt normal.  She reports she was not having any ataxia or feeling off balance and she had no syncopal episodes.  She denies having any numbness or weakness in her extremities or face and had no drooping face.  After arriving home she tried to text her granddaughter which she texted was garbled and made no sense.  Her son came to check on her around 61 and by that time she could speak normally but with her symptoms she thought it best to go to the emergency room and be checked out.  She was evaluated at [facility] emergency room as a code stroke.  She was seen by neurology but she was outside the window for tPA.  CT of her head was negative and CT angiography of her head and neck were negative for large vessel occlusions. Her lab work in the emergency room was unremarkable.  Hospitalist service was asked to observe patient overnight for further management She lives alone.  Her husband died a year ago.  She denies tobacco, alcohol or illicit drug use.  Review of Systems:  General: Denies weakness, fever, chills, weight loss, night sweats.  Denies dizziness.  Denies change in appetite HENT: Denies head trauma, headache, denies change in hearing, tinnitus.  Denies nasal congestion.  Denies sore throat.   Denies difficulty swallowing Eyes: Denies blurry vision, pain in eye, drainage.  Denies discoloration of eyes. Neck: Denies pain.  Denies swelling.  Denies pain with movement. Cardiovascular: Denies chest pain, palpitations.  Denies edema.  Denies orthopnea Respiratory: Denies shortness of breath, cough.  Denies wheezing.  Denies sputum production Gastrointestinal: Denies abdominal pain, swelling.  Denies nausea, vomiting, diarrhea.  Denies melena.  Denies hematemesis. Musculoskeletal: Denies limitation of movement.  Denies deformity or swelling. Denies arthralgias or myalgias. Genitourinary: Denies pelvic pain.  Denies urinary frequency or hesitancy.  Denies dysuria.  Skin: Denies rash.  Denies petechiae, purpura, ecchymosis. Neurological: Reports difficulty speaking and getting right words to say or text but no slurred speech. Denies syncope. Denies seizure activity. Denies weakness or paresthesia.  Denies drooping face.  Denies visual change. Psychiatric: Denies depression, anxiety. Denies hallucinations.  Past Medical History:  Diagnosis Date   Chicken pox    Complication of anesthesia    Per patient, very slow to wake up after anesthesia   High cholesterol    Melanoma ([place]) 2021   right hip   PONV (postoperative nausea and vomiting)    Urinary incontinence     Past Surgical History:  Procedure Laterality Date   APPENDECTOMY  1962   EXCISION MELANOMA WITH SENTINEL LYMPH NODE BIOPSY Right 10/09/2019   Procedure: WIDE LOCAL EXCISION WITH ADVANCEMENT FLAP CLOSURE RIGHT HIP MELANOMA WITH SENTINEL LYMPH NODE BIOPSY;  Surgeon: Stark Klein, MD;  Location: [place];  Service: General;  Laterality: Right;   HERNIA REPAIR  2009   MOUTH SURGERY  11/2015   gum surgery and bone implant   WISDOM TOOTH EXTRACTION      Social History  reports that she has never smoked. She has never used smokeless tobacco. She reports that she does not drink alcohol and does not use drugs.  Allergies   Allergen Reactions   Codeine Nausea Only    Family History  Problem Relation Age of Onset   Hyperlipidemia Other        died 51- "natural causes"   Coronary artery disease Father        died at 90   COPD Father    Alcohol abuse Father    Diabetes Mellitus II Father    Hyperlipidemia Other        4 siblings    Hypertension Other        3 siblings   Coronary artery disease Sister    Heart attack Brother      Prior to Admission medications   Medication Sig Start Date End Date Taking? Authorizing Provider  acetaminophen (TYLENOL) 500 MG tablet Take 500-1,000 mg by mouth every 6 (six) hours as needed (for pain).    [provider]  ascorbic acid (VITAMIN C) 500 MG tablet Take 500 mg by mouth daily.    [provider]  doxycycline (VIBRA-TABS) 100 MG tablet Take 100 mg by mouth daily. 07/15/19   [provider]  Iron Combinations (IRON COMPLEX PO) Take by mouth.    [provider]  Omega-3 Fatty Acids (FISH OIL) 500 MG CAPS Take 500 mg by mouth daily.     [provider]  OVER THE COUNTER MEDICATION Take 1-2 capsules by mouth See admin instructions. Balance Of Nature Fruit And Vegetable Supplements  Take 4 capsules in the morning (2 Fruit + 2 Vegetable) & take 2 capsules in the evening (1 Fruit + 1 Vegetable)    [provider]    Physical Exam: Vitals:   10/06/20 2204 10/07/20 0025 10/07/20 0030 10/07/20 0206  BP: (!) 152/59 130/67 125/60 115/90  Pulse: 90 71 74 82  Resp: '15 19 15   '$ Temp:    98.7 F (37.1 C)  TempSrc:    Oral  SpO2: 100% 99% 98% 99%  Weight:    64 kg    Constitutional: NAD, calm, comfortable Vitals:   10/06/20 2204 10/07/20 0025 10/07/20 0030 10/07/20 0206  BP: (!) 152/59 130/67 125/60 115/90  Pulse: 90 71 74 82  Resp: '15 19 15   '$ Temp:    98.7 F (37.1 C)  TempSrc:    Oral  SpO2: 100% 99% 98% 99%  Weight:    64 kg   General: WDWN, Alert and oriented x3.  Eyes: EOMI, PERRL, conjunctivae  normal.  Sclera nonicteric HENT:  [place]/AT, external ears normal.  Nares patent without epistasis.  Mucous membranes are moist. Normal dentition.  Neck: Soft, normal range of motion, supple, no masses, Trachea midline Respiratory: clear to auscultation bilaterally, no wheezing, no crackles. Normal respiratory effort. No accessory muscle use.  Cardiovascular: Regular rate and rhythm, no murmurs / rubs / gallops. No extremity edema. 2+ pedal pulses. No carotid bruits.  Abdomen: Soft, no tenderness, nondistended, no rebound or guarding.  No masses palpated. No hepatosplenomegaly. Bowel sounds normoactive Musculoskeletal: FROM. no cyanosis. No joint deformity upper and lower extremities. Normal muscle tone.  Skin: Warm, dry, intact no rashes, lesions, ulcers. No induration Neurologic: CN 2-12 grossly  intact.  Normal speech.  Sensation intact to touch. patella DTR +1 bilaterally. Strength 5/5 in all extremities. No pronator drift. Normal finger to nose. Normal heel to shin.  Psychiatric: Normal judgment and insight.  Normal mood.    Labs on Admission: I have personally reviewed following labs and imaging studies  CBC: Recent Labs  Lab 10/06/20 1844  WBC 7.0  NEUTROABS 5.3  HGB 10.5*  HCT 33.7*  MCV 77.1*  PLT 415*    Basic Metabolic Panel: Recent Labs  Lab 10/06/20 1844  NA 132*  K 3.9  CL 96*  CO2 27  GLUCOSE 148*  BUN 10  CREATININE 0.59  CALCIUM 9.4    GFR: Estimated Creatinine Clearance: 64.8 mL/min (by C-G formula based on SCr of 0.59 mg/dL).  Liver Function Tests: Recent Labs  Lab 10/06/20 1844  AST 23  ALT 21  ALKPHOS 74  BILITOT 0.2*  PROT 8.1  ALBUMIN 3.2*    Urine analysis:    Component Value Date/Time   COLORURINE YELLOW 10/06/2020 1844   APPEARANCEUR CLEAR 10/06/2020 1844   LABSPEC <1.005 (L) 10/06/2020 1844   PHURINE 7.0 10/06/2020 1844   GLUCOSEU NEGATIVE 10/06/2020 1844   GLUCOSEU NEGATIVE 06/06/2017 0953   HGBUR TRACE (A) 10/06/2020 1844    BILIRUBINUR NEGATIVE 10/06/2020 1844   BILIRUBINUR neg 06/15/2020 1333   [place] 10/06/2020 1844   PROTEINUR NEGATIVE 10/06/2020 1844   UROBILINOGEN 0.2 06/15/2020 1333   UROBILINOGEN 0.2 06/06/2017 0953   NITRITE NEGATIVE 10/06/2020 1844   LEUKOCYTESUR NEGATIVE 10/06/2020 1844    Radiological Exams on Admission: CT HEAD CODE STROKE WO CONTRAST  Result Date: 10/06/2020 CLINICAL DATA:  Code stroke.  Acute neuro deficit. EXAM: CT HEAD WITHOUT CONTRAST TECHNIQUE: Contiguous axial images were obtained from the base of the skull through the vertex without intravenous contrast. COMPARISON:  None. FINDINGS: Brain: Ventricle size normal. Mild patchy white matter hypodensity bilaterally, likely chronic Negative for acute infarct, hemorrhage, mass Vascular: Negative for hyperdense vessel Skull: Negative Sinuses/Orbits: Paranasal sinuses clear.  Negative orbit Other: None ASPECTS ([place] Stroke Program Early CT Score) - Ganglionic level infarction (caudate, lentiform nuclei, internal capsule, insula, M1-M3 cortex): 7 - Supraganglionic infarction (M4-M6 cortex): 3 Total score (0-10 with 10 being normal): 10 IMPRESSION: 1. No acute abnormality 2. ASPECTS is 10 3. Mild white matter hypodensity likely due to chronic microvascular ischemia. 4. These results were called by telephone at the time of interpretation on 10/06/2020 at 6:55 pm to provider [facility] , who verbally acknowledged these results. Electronically Signed   By: Franchot Gallo M.D.   On: 10/06/2020 18:55   CT ANGIO HEAD CODE STROKE  Result Date: 10/06/2020 CLINICAL DATA:  Neuro deficit, acute, stroke suspected EXAM: CT ANGIOGRAPHY HEAD AND NECK TECHNIQUE: Multidetector CT imaging of the head and neck was performed using the standard protocol during bolus administration of intravenous contrast. Multiplanar CT image reconstructions and MIPs were obtained to evaluate the vascular anatomy. Carotid stenosis measurements (when applicable) are  obtained utilizing NASCET criteria, using the distal internal carotid diameter as the denominator. CONTRAST:  114m OMNIPAQUE IOHEXOL 350 MG/ML SOLN COMPARISON:  Same day CT head. FINDINGS: CTA NECK FINDINGS Aortic arch: Great vessel origins are patent. Right carotid system: No evidence of dissection, stenosis (50% or greater) or occlusion. Left carotid system: No evidence of dissection, stenosis (50% or greater) or occlusion. Vertebral arteries: Limited evaluation proximally due to extensive streak artifact from adjacent venous contrast Skeleton: Mild-to-moderate multilevel degenerative change. Other neck: Suspected narrowing  of the left subclavian vein as it courses between the sternum and aorta at with reflux of venous contrast into small veins in the neck. No acute findings in the neck. The Upper chest: Mild dependent ground-glass opacities, likely atelectasis. Otherwise, lung apices are clear. Review of the MIP images confirms the above findings CTA HEAD FINDINGS Anterior circulation: Calcific atherosclerosis of bilateral intracranial ICAs with mild narrowing. Bilateral MCAs and ACAs are patent without proximal hemodynamically significant stenosis. No aneurysm identified. Posterior circulation: Bilateral intradural vertebral arteries, basilar artery, and posterior cerebral arteries are patent without proximal hemodynamically significant stenosis. Venous sinuses: As permitted by contrast timing, patent. Anatomic variants: None Review of the MIP images confirms the above findings IMPRESSION: No large vessel occlusion or proximal hemodynamically significant stenosis in the head or neck. Findings discussed with Dr. Cheral Marker via telephone at 7:34 p.m. Electronically Signed   By: Margaretha Sheffield MD   On: 10/06/2020 19:47   CT ANGIO NECK CODE STROKE  Result Date: 10/06/2020 CLINICAL DATA:  Neuro deficit, acute, stroke suspected EXAM: CT ANGIOGRAPHY HEAD AND NECK TECHNIQUE: Multidetector CT imaging of the head and  neck was performed using the standard protocol during bolus administration of intravenous contrast. Multiplanar CT image reconstructions and MIPs were obtained to evaluate the vascular anatomy. Carotid stenosis measurements (when applicable) are obtained utilizing NASCET criteria, using the distal internal carotid diameter as the denominator. CONTRAST:  174m OMNIPAQUE IOHEXOL 350 MG/ML SOLN COMPARISON:  Same day CT head. FINDINGS: CTA NECK FINDINGS Aortic arch: Great vessel origins are patent. Right carotid system: No evidence of dissection, stenosis (50% or greater) or occlusion. Left carotid system: No evidence of dissection, stenosis (50% or greater) or occlusion. Vertebral arteries: Limited evaluation proximally due to extensive streak artifact from adjacent venous contrast Skeleton: Mild-to-moderate multilevel degenerative change. Other neck: Suspected narrowing of the left subclavian vein as it courses between the sternum and aorta at with reflux of venous contrast into small veins in the neck. No acute findings in the neck. The Upper chest: Mild dependent ground-glass opacities, likely atelectasis. Otherwise, lung apices are clear. Review of the MIP images confirms the above findings CTA HEAD FINDINGS Anterior circulation: Calcific atherosclerosis of bilateral intracranial ICAs with mild narrowing. Bilateral MCAs and ACAs are patent without proximal hemodynamically significant stenosis. No aneurysm identified. Posterior circulation: Bilateral intradural vertebral arteries, basilar artery, and posterior cerebral arteries are patent without proximal hemodynamically significant stenosis. Venous sinuses: As permitted by contrast timing, patent. Anatomic variants: None Review of the MIP images confirms the above findings IMPRESSION: No large vessel occlusion or proximal hemodynamically significant stenosis in the head or neck. Findings discussed with Dr. LCheral Markervia telephone at 7:34 p.m. Electronically Signed    By: FMargaretha SheffieldMD   On: 10/06/2020 19:47    EKG: Independently reviewed.  EKG shows normal sinus rhythm with no acute ST elevation or depression.  QTc 437  Assessment/Plan Principal Problem:   TIA (transient ischemic attack) Patient be observed on medical telemetry floor for TIA/CVA.  Will obtain MRI of brain to rule out acute ischemic CVA.  Will evaluate carotids for large vessel occlusion/stenosis. Obtain echocardiogram to evaluate for PFO, wall motion and ejection fraction. Hypertension of 220/110 will be allowed for 24 hours per stroke protocol.  After which blood pressure will be slowly reduced to goal level. Antiplatelet therapy with aspirin daily. Check lipid panel. Check HgbA1c Neurochecks per stroke protocol  Active Problems:   Aphasia Has resolved and patient now speaking normally.  Iron deficiency anemia Chronic. Continue iron supplementation.     DVT prophylaxis: Padua score low, early ambulation for DVT prophylaxis.  Code Status:   Full Code  Family Communication:  Diagnosis and plan discussed with patient.  Patient verbalized understanding and agrees with plan.  Questions were answered.  Further recommendations to follow as clinical indicated Disposition Plan:   Patient is from:  Home  Anticipated DC to:  Home  Anticipated DC date:  Anticipate less than 2 midnight stay  Anticipated DC barriers: Barriers to discharge identified at this time  Consults called:  Neurology  Admission status:  Observation   Eben Burow MD [facility]  How to contact the [facility] Attending or Consulting provider [place] or covering provider during after hours [place], for this patient?   Check the care team in [facility] and look for a) attending/consulting [facility] provider listed and b) the [facility] team listed Log into [email or web address] and use [place]'s universal password to access. If you do not have the password, please contact the hospital operator. Locate the [facility] provider  you are looking for under [facility] and page to a number that you can be directly reached. If you still have difficulty reaching the provider, please page the [facility] (Director on Call) for the Hospitalists listed on amion for assistance.  10/07/2020, 3:06 AM

## 2020-10-07 NOTE — Evaluation (Signed)
Physical Therapy Evaluation Patient Details Name: Ariel [name] MRN: [id number] DOB: 07/01/1955 Today's Date: 10/07/2020   History of Present Illness  Pt admitted on 10/06/20 with garbled speech, CT and MRI negative for acute changes, TIA suspected. PMH: iron deficiency anemia and rosacea.  Clinical Impression  Patient received in bed, very pleasant and motivated. Reports her main concern she would like PT to check is a bit of unsteadiness when getting up at night to go to the bathroom. Able to ambulate in hallway and manage steps without difficulty. Scored 21/24 on DGI today. Education provided about taking her time with position changes especially in the middle of the night, as well as importance of good lighting and footwear. Left up in recliner with all needs met, family present. No skilled PT f/u needed- signing off, thank you for the opportunity to participate in her care!     Follow Up Recommendations No PT follow up    Equipment Recommendations  None recommended by PT    Recommendations for Other Services       Precautions / Restrictions Precautions Precautions: None Restrictions Weight Bearing Restrictions: No      Mobility  Bed Mobility Overal bed mobility: Modified Independent             General bed mobility comments: HOB up    Transfers Overall transfer level: Independent Equipment used: None                Ambulation/Gait Ambulation/Gait assistance: Independent Gait Distance (Feet): 300 Feet Assistive device: None Gait Pattern/deviations: WFL(Within Functional Limits);Step-through pattern Gait velocity: WNL   General Gait Details: gait pattern WNL, no significant deviations or unsteadiness noted  Stairs Stairs: Yes Stairs assistance: Min guard Stair Management: No rails;Alternating pattern;Forwards Number of Stairs: 14 General stair comments: min guard for safety when not using railings, but could easily be Mod(I) with use of  rail  Wheelchair Mobility    Modified Rankin (Stroke Patients Only)       Balance Overall balance assessment: Independent                               Standardized Balance Assessment Standardized Balance Assessment : Dynamic Gait Index   Dynamic Gait Index Level Surface: Normal Change in Gait Speed: Normal Gait with Horizontal Head Turns: Mild Impairment Gait with Vertical Head Turns: Mild Impairment Gait and Pivot Turn: Normal Step Over Obstacle: Normal Step Around Obstacles: Normal Steps: Mild Impairment Total Score: 21       Pertinent Vitals/Pain Pain Assessment: No/denies pain    Home Living Family/patient expects to be discharged to:: Private residence Living Arrangements: Alone Available Help at Discharge: Family;Available PRN/intermittently Type of Home: House         Home Equipment: None      Prior Function Level of Independence: Independent               Hand Dominance   Dominant Hand: Right    Extremity/Trunk Assessment   Upper Extremity Assessment Upper Extremity Assessment: Defer to OT evaluation    Lower Extremity Assessment Lower Extremity Assessment: Overall WFL for tasks assessed    Cervical / Trunk Assessment Cervical / Trunk Assessment: Normal  Communication   Communication: No difficulties (but reports speech is not at baseline)  Cognition Arousal/Alertness: Awake/alert Behavior During Therapy: WFL for tasks assessed/performed Overall Cognitive Status: Within Functional Limits for tasks assessed  General Comments      Exercises     Assessment/Plan    PT Assessment Patent does not need any further PT services  PT Problem List         PT Treatment Interventions      PT Goals (Current goals can be found in the Care Plan section)  Acute Rehab PT Goals Patient Stated Goal: get balance checked PT Goal Formulation: With patient Time For Goal  Achievement: 10/21/20 Potential to Achieve Goals: Good    Frequency     Barriers to discharge        Co-evaluation               AM-PAC PT "6 Clicks" Mobility  Outcome Measure Help needed turning from your back to your side while in a flat bed without using bedrails?: None Help needed moving from lying on your back to sitting on the side of a flat bed without using bedrails?: None Help needed moving to and from a bed to a chair (including a wheelchair)?: None Help needed standing up from a chair using your arms (e.g., wheelchair or bedside chair)?: None Help needed to walk in hospital room?: None Help needed climbing 3-5 steps with a railing? : None 6 Click Score: 24    End of Session Equipment Utilized During Treatment: Gait belt Activity Tolerance: Patient tolerated treatment well Patient left: in chair;with call bell/phone within reach;with family/visitor present Nurse Communication: Mobility status PT Visit Diagnosis: Unsteadiness on feet (R26.81)    Time: 0569-7948 PT Time Calculation (min) (ACUTE ONLY): 10 min   Charges:   PT Evaluation $PT Eval Low Complexity: 1 Low         Windell Norfolk, DPT, PN2   Supplemental Physical Therapist [place]    Pager [phone] Acute Rehab Office [phone]

## 2020-10-08 ENCOUNTER — Other Ambulatory Visit: Payer: Self-pay | Admitting: Physician Assistant

## 2020-10-08 DIAGNOSIS — G459 Transient cerebral ischemic attack, unspecified: Secondary | ICD-10-CM

## 2020-10-10 NOTE — Discharge Summary (Signed)
Physician Discharge Summary  Ariel [name] [id number] DOB: May 14, 1955 DOA: 10/06/2020  PCP: [name], NP  Admit date: 10/06/2020 Discharge date: 10/10/2020  Time spent: 40 minutes  Recommendations for Outpatient Follow-up:  Follow outpatient CBC/CMP Follow with neurology outpatient  Aspirin/plavix x3 weeks, then aspirin alone Needs 30 day event monitor with cardiology outpatient  Trial of pravastatin with hx of prior statin intolerance  Follow prediabetes outpatient    Discharge Diagnoses:  Principal Problem:   TIA (transient ischemic attack) Active Problems:   Aphasia   Iron deficiency anemia   Discharge Condition: stable   Diet recommendation: heart healthy  Filed Weights   10/07/20 0206  Weight: 64 kg    History of present illness:   Ariel [name] is Ariel [name] 65 y.o. female with medical history significant for Iron deficiency anemia, rosacea who presents for evaluation of incomprehensible speech.  She reports she had lunch with her granddaughter and when she dropped her off and was getting ready to leave she noticed that she was having difficulty finding the right words to say and speaking them.  This was around 120 in the afternoon on August 10.  She drove herself home and she had no dizziness, chest pain, shortness of breath and she otherwise felt normal.  She reports she was not having any ataxia or feeling off balance and she had no syncopal episodes.  She denies having any numbness or weakness in her extremities or face and had no drooping face.  After arriving home she tried to text her granddaughter which she texted was garbled and made no sense.  Her son came to check on her around 48 and by that time she could speak normally but with her symptoms she thought it best to go to the emergency room and be checked out.  She was evaluated at [facility] emergency room as Tova Vater code stroke.  She was seen by neurology but she was outside the window for tPA.  CT of her  head was negative and CT angiography of her head and neck were negative for large vessel occlusions. Her lab work in the emergency room was unremarkable.  Hospitalist service was asked to observe patient overnight for further management She lives alone.  Her husband died Carnita Golob year ago.  She denies tobacco, alcohol or illicit drug use.  She was admitted for TIA symptoms.  She was seen by neurology who suspected TIA vs seizure vs migraine equivalent.  Plan for 30 day event monitor, DAPT x 3 weeks, then aspirin alone.      Hospital Course:  TIA  Aphasia Admitted with word finding difficulties concerning for TIA vs seizure vs migraine equivalent MRI brain without acute intracranial abnormality - solitary chronic microhemorrhage in L frontal operculum, but otherwise normal for age CTA without LVO or proximal significant stenosis in the head or neck Head CT without acute abnormality - mild white matter hypodensity likely due to chronic microvascular ischemia Echo with EF 123456, grade 1 diastolic dysfunction (see report) Cleared by PT/OT/SLP EEG without seizures or epileptiform discharges Neurology recommending 30 day event monitor, ASA/plavix x3 weeks, then asa alone.  On pravastatin due to prior statin intolerance. LDL 83, A1c 6  Prediabetes Follow outpatient   Procedures: Echo IMPRESSIONS     1. Left ventricular ejection fraction, by estimation, is 60 to 65%. The  left ventricle has normal function. The left ventricle has no regional  wall motion abnormalities. Left ventricular diastolic parameters are  consistent with Grade I diastolic  dysfunction (impaired relaxation).   2. Right ventricular systolic function is normal. The right ventricular  size is normal. There is normal pulmonary artery systolic pressure.   3. The mitral valve is grossly normal. Trivial mitral valve  regurgitation.   4. The aortic valve is tricuspid. Aortic valve regurgitation is not  visualized.   5. The  inferior vena cava is normal in size with greater than 50%  respiratory variability, suggesting right atrial pressure of 3 mmHg.   Comparison(s): No prior Echocardiogram.   Conclusion(s)/Recommendation(s): No intracardiac source of embolism  detected on this transthoracic study. Jaxie Racanelli transesophageal echocardiogram is  recommended to exclude cardiac source of embolism if clinically indicated.   Consultations: neurology  Discharge Exam: Vitals:   10/07/20 1050 10/07/20 1445  BP: 131/62 (!) 101/56  Pulse: 81 72  Resp: 18 16  Temp: 98.1 F (36.7 C) 99.4 F (37.4 C)  SpO2: 94%    Eager to discharg  General: No acute distress. Cardiovascular: Heart sounds show Timo Hartwig regular rate, and rhythm.  Lungs: Clear to auscultation bilaterally  Abdomen: Soft, nontender, nondistended  Neurological: Alert and oriented 3. Moves all extremities 4 . Cranial nerves II through XII grossly intact. Skin: Warm and dry. No rashes or lesions. Extremities: No clubbing or cyanosis. No edema. Pedal pulses 2  Discharge Instructions   Discharge Instructions     Ambulatory referral to Neurology   Complete by: As directed    An appointment is requested in approximately: 4 weeks   Call MD for:  difficulty breathing, headache or visual disturbances   Complete by: As directed    Call MD for:  extreme fatigue   Complete by: As directed    Call MD for:  hives   Complete by: As directed    Call MD for:  persistant dizziness or light-headedness   Complete by: As directed    Call MD for:  persistant nausea and vomiting   Complete by: As directed    Call MD for:  redness, tenderness, or signs of infection (pain, swelling, redness, odor or green/yellow discharge around incision site)   Complete by: As directed    Call MD for:  severe uncontrolled pain   Complete by: As directed    Call MD for:  temperature >100.4   Complete by: As directed    Diet - low sodium heart healthy   Complete by: As directed     Discharge instructions   Complete by: As directed    You were seen for symptoms concerning for [name] TIA (ministroke).  Your workup was negative so far.  Your MRI did not show evidence of Florabel Faulks stroke.    You were seen by neurology, we'll discharge you on aspirin and plavix for 3 weeks, then aspirin alone.  Start pravastatin, but with your prior history of muscle aches and pains, watch for symptoms.  If you tolerate this dose, discuss considering dose escalation as tolerated with your PCP.    You have prediabetes, this should be followed with your PCP.  Return for new, recurrent, or worsening symptoms.  Please ask your PCP to request records from this hospitalization so they know what was done and what the next steps will be.   Increase activity slowly   Complete by: As directed       Allergies as of 10/07/2020       Reactions   Codeine Nausea Only        Medication List     STOP taking these medications  doxycycline 100 MG tablet Commonly known as: VIBRA-TABS       TAKE these medications    acetaminophen 500 MG tablet Commonly known as: TYLENOL Take 500-1,000 mg by mouth every 6 (six) hours as needed (for pain).   ascorbic acid 500 MG tablet Commonly known as: VITAMIN C Take 500 mg by mouth daily.   aspirin 81 MG EC tablet Take 1 tablet (81 mg total) by mouth daily. Swallow whole.   clopidogrel 75 MG tablet Commonly known as: Plavix Take 1 tablet (75 mg total) by mouth daily for 21 days.   pravastatin 20 MG tablet Commonly known as: Pravachol Take 1 tablet (20 mg total) by mouth every evening. Please let your physician know if you develop muscle aches or issues tolerating this statin   SLOW FE PO Take 1 tablet by mouth daily.   VISINE DRY EYE OP Place 1 drop into both eyes daily.       Allergies  Allergen Reactions   Codeine Nausea Only    Follow-up Information     Tat, Eustace Quail, DO. Schedule an appointment as soon as possible for Neya Creegan visit in 1  month(s).   Specialty: Neurology Contact information: [address]  [place] [address] [phone]                  The results of significant diagnostics from this hospitalization (including imaging, microbiology, ancillary and laboratory) are listed below for reference.    Significant Diagnostic Studies: MR BRAIN WO CONTRAST  Result Date: 10/07/2020 CLINICAL DATA:  65 year old female with sudden onset abnormal speech at 1330 hours yesterday. Clinically improved. EXAM: MRI HEAD WITHOUT CONTRAST TECHNIQUE: Multiplanar, multiecho pulse sequences of the brain and surrounding structures were obtained without intravenous contrast. COMPARISON:  CT head, CTA head and neck yesterday. FINDINGS: Brain: No restricted diffusion to suggest acute infarction. No midline shift, mass effect, evidence of mass lesion, ventriculomegaly, extra-axial collection or acute intracranial hemorrhage. Cervicomedullary junction and pituitary are within normal limits. There is Teagen Mcleary solitary small focus of hemosiderin at the left frontal operculum (series 14, image 36) with no corresponding T2 or FLAIR signal abnormality. No other chronic cerebral blood products identified, and otherwise gray and white matter signal is within normal limits for age throughout the brain. No cortical encephalomalacia. Vascular: Major intracranial vascular flow voids are preserved. Skull and upper cervical spine: Negative for age visible cervical spine. Visualized bone marrow signal is within normal limits. Sinuses/Orbits: Negative orbits. Paranasal Visualized paranasal sinuses and mastoids are stable and well aerated. Other: Visible internal auditory structures appear normal. Negative visible scalp and face. IMPRESSION: 1. No acute intracranial abnormality. 2. Solitary chronic micro-hemorrhage in the left frontal operculum, but otherwise normal for age noncontrast MRI appearance of the brain. Electronically Signed   By: Genevie Ann M.D.   On: 10/07/2020 04:41   EEG adult  Result Date: 10/07/2020 Lora Havens, MD     10/07/2020  2:45 PM Patient Name: Othello Dionicio MRN: [id number] Epilepsy Attending: Lora Havens Referring Physician/Provider: Dr Rosalin Hawking Date: 10/07/2020 Duration: 20.11 mins Patient history: 65 year old female presented with incomprehensible speech.  EEG to evaluate for seizures. Level of alertness: Awake, asleep AEDs during EEG study: None Technical aspects: This EEG study was done with scalp electrodes positioned according to the 10-20 International system of electrode placement. Electrical activity was acquired at [place] sampling rate of '500Hz'$  and reviewed with Foster Sonnier high frequency filter of '70Hz'$  and Zohra Clavel low frequency filter  of '1Hz'$ . EEG data were recorded continuously and digitally stored. Description: The posterior dominant rhythm consists of 9-10 Hz activity of moderate voltage (25-35 uV) seen predominantly in posterior head regions, symmetric and reactive to eye opening and eye closing. Sleep was characterized by vertex waves, sleep spindles (12 to 14 Hz), maximal frontocentral region.  EEG showed intermittent left frontotemporal theta-delta slowing. Physiologic photic driving was seen during photic stimulation.  Hyperventilation was not performed.   ABNORMALITY - Intermittent slow, left frontotemporal region IMPRESSION: This study is suggestive of cortical dysfunction in left frontotemporal region, nonspecific etiology. No seizures or epileptiform discharges were seen throughout the recording. Lora Havens   ECHOCARDIOGRAM COMPLETE  Result Date: 10/07/2020    ECHOCARDIOGRAM REPORT   Patient Name:   JASIAH HOUY Date of Exam: 10/07/2020 Medical Rec #:  [id number]    Height:       66.0 in Accession #:    [id number]   Weight:       141.2 lb Date of Birth:  05-05-1955    BSA:          1.725 m Patient Age:    39 years     BP:           108/62 mmHg Patient Gender: F            HR:           72 bpm. Exam Location:   Inpatient Procedure: 2D Echo, Cardiac Doppler and Color Doppler Indications:    TIA G45.9  History:        Patient has no prior history of Echocardiogram examinations.                 Risk Factors:Dyslipidemia and Non-Smoker.  Sonographer:    Vickie Epley RDCS Referring Phys: [id number] [place]  1. Left ventricular ejection fraction, by estimation, is 60 to 65%. The left ventricle has normal function. The left ventricle has no regional wall motion abnormalities. Left ventricular diastolic parameters are consistent with Grade I diastolic dysfunction (impaired relaxation).  2. Right ventricular systolic function is normal. The right ventricular size is normal. There is normal pulmonary artery systolic pressure.  3. The mitral valve is grossly normal. Trivial mitral valve regurgitation.  4. The aortic valve is tricuspid. Aortic valve regurgitation is not visualized.  5. The inferior vena cava is normal in size with greater than 50% respiratory variability, suggesting right atrial pressure of 3 mmHg. Comparison(s): No prior Echocardiogram. Conclusion(s)/Recommendation(s): No intracardiac source of embolism detected on this transthoracic study. Jefferson Fullam transesophageal echocardiogram is recommended to exclude cardiac source of embolism if clinically indicated. FINDINGS  Left Ventricle: Left ventricular ejection fraction, by estimation, is 60 to 65%. The left ventricle has normal function. The left ventricle has no regional wall motion abnormalities. The left ventricular internal cavity size was normal in size. There is  no left ventricular hypertrophy. Left ventricular diastolic parameters are consistent with Grade I diastolic dysfunction (impaired relaxation). Indeterminate filling pressures. Right Ventricle: The right ventricular size is normal. No increase in right ventricular wall thickness. Right ventricular systolic function is normal. There is normal pulmonary artery systolic pressure. The tricuspid  regurgitant velocity is 2.36 m/s, and  with an assumed right atrial pressure of 3 mmHg, the estimated right ventricular systolic pressure is [id number] mmHg. Left Atrium: Left atrial size was normal in size. Right Atrium: Right atrial size was normal in size. Pericardium: There is no evidence of pericardial effusion. Mitral Valve: The mitral valve  is grossly normal. Trivial mitral valve regurgitation. Tricuspid Valve: The tricuspid valve is grossly normal. Tricuspid valve regurgitation is trivial. Aortic Valve: The aortic valve is tricuspid. Aortic valve regurgitation is not visualized. Pulmonic Valve: The pulmonic valve was normal in structure. Pulmonic valve regurgitation is not visualized. Aorta: The aortic root and ascending aorta are structurally normal, with no evidence of dilitation. Venous: The inferior vena cava is normal in size with greater than 50% respiratory variability, suggesting right atrial pressure of 3 mmHg. IAS/Shunts: No atrial level shunt detected by color flow Doppler.  LEFT VENTRICLE PLAX 2D LVIDd:         4.50 cm     Diastology LVIDs:         3.00 cm     LV e' medial:    8.92 cm/s LV PW:         0.80 cm     LV E/e' medial:  7.3 LV IVS:        0.80 cm     LV e' lateral:   9.36 cm/s LVOT diam:     1.80 cm     LV E/e' lateral: 7.0 LV SV:         60 LV SV Index:   35 LVOT Area:     2.54 cm  LV Volumes (MOD) LV vol d, MOD A2C: 90.4 ml LV vol d, MOD A4C: 83.5 ml LV vol s, MOD A2C: 35.5 ml LV vol s, MOD A4C: 32.9 ml LV SV MOD A2C:     54.9 ml LV SV MOD A4C:     83.5 ml LV SV MOD BP:      55.3 ml RIGHT VENTRICLE RV S prime:     15.20 cm/s TAPSE (M-mode): 2.2 cm LEFT ATRIUM             Index       RIGHT ATRIUM           Index LA diam:        3.50 cm 2.03 cm/m  RA Area:     13.30 cm LA Vol (A2C):   34.6 ml 20.06 ml/m RA Volume:   31.60 ml  18.32 ml/m LA Vol (A4C):   53.9 ml 31.25 ml/m LA Biplane Vol: 45.7 ml 26.50 ml/m  AORTIC VALVE LVOT Vmax:   115.00 cm/s LVOT Vmean:  77.100 cm/s LVOT VTI:     0.234 m  AORTA Ao Root diam: 2.90 cm Ao Asc diam:  3.20 cm MITRAL VALVE               TRICUSPID VALVE MV Area (PHT): 2.85 cm    TR Peak grad:   22.3 mmHg MV Decel Time: 266 msec    TR Vmax:        236.00 cm/s MV E velocity: 65.10 cm/s MV Malaak Stach velocity: 47.60 cm/s  SHUNTS MV E/Sreshta Cressler ratio:  1.37        Systemic VTI:  0.23 m                            Systemic Diam: 1.80 cm Lyman Bishop MD Electronically signed by Lyman Bishop MD Signature Date/Time: 10/07/2020/1:10:53 PM    Final    CT HEAD CODE STROKE WO CONTRAST  Result Date: 10/06/2020 CLINICAL DATA:  Code stroke.  Acute neuro deficit. EXAM: CT HEAD WITHOUT CONTRAST TECHNIQUE: Contiguous axial images were obtained from the base of the skull through the vertex without intravenous contrast.  COMPARISON:  None. FINDINGS: Brain: Ventricle size normal. Mild patchy white matter hypodensity bilaterally, likely chronic Negative for acute infarct, hemorrhage, mass Vascular: Negative for hyperdense vessel Skull: Negative Sinuses/Orbits: Paranasal sinuses clear.  Negative orbit Other: None ASPECTS ([place] Stroke Program Early CT Score) - Ganglionic level infarction (caudate, lentiform nuclei, internal capsule, insula, M1-M3 cortex): 7 - Supraganglionic infarction (M4-M6 cortex): 3 Total score (0-10 with 10 being normal): 10 IMPRESSION: 1. No acute abnormality 2. ASPECTS is 10 3. Mild white matter hypodensity likely due to chronic microvascular ischemia. 4. These results were called by telephone at the time of interpretation on 10/06/2020 at 6:55 pm to provider [facility] , who verbally acknowledged these results. Electronically Signed   By: Franchot Gallo M.D.   On: 10/06/2020 18:55   CT ANGIO HEAD CODE STROKE  Result Date: 10/06/2020 CLINICAL DATA:  Neuro deficit, acute, stroke suspected EXAM: CT ANGIOGRAPHY HEAD AND NECK TECHNIQUE: Multidetector CT imaging of the head and neck was performed using the standard protocol during bolus administration of intravenous contrast.  Multiplanar CT image reconstructions and MIPs were obtained to evaluate the vascular anatomy. Carotid stenosis measurements (when applicable) are obtained utilizing NASCET criteria, using the distal internal carotid diameter as the denominator. CONTRAST:  136m OMNIPAQUE IOHEXOL 350 MG/ML SOLN COMPARISON:  Same day CT head. FINDINGS: CTA NECK FINDINGS Aortic arch: Great vessel origins are patent. Right carotid system: No evidence of dissection, stenosis (50% or greater) or occlusion. Left carotid system: No evidence of dissection, stenosis (50% or greater) or occlusion. Vertebral arteries: Limited evaluation proximally due to extensive streak artifact from adjacent venous contrast Skeleton: Mild-to-moderate multilevel degenerative change. Other neck: Suspected narrowing of the left subclavian vein as it courses between the sternum and aorta at with reflux of venous contrast into small veins in the neck. No acute findings in the neck. The Upper chest: Mild dependent ground-glass opacities, likely atelectasis. Otherwise, lung apices are clear. Review of the MIP images confirms the above findings CTA HEAD FINDINGS Anterior circulation: Calcific atherosclerosis of bilateral intracranial ICAs with mild narrowing. Bilateral MCAs and ACAs are patent without proximal hemodynamically significant stenosis. No aneurysm identified. Posterior circulation: Bilateral intradural vertebral arteries, basilar artery, and posterior cerebral arteries are patent without proximal hemodynamically significant stenosis. Venous sinuses: As permitted by contrast timing, patent. Anatomic variants: None Review of the MIP images confirms the above findings IMPRESSION: No large vessel occlusion or proximal hemodynamically significant stenosis in the head or neck. Findings discussed with Dr. LCheral Markervia telephone at 7:34 p.m. Electronically Signed   By: FMargaretha SheffieldMD   On: 10/06/2020 19:47   CT ANGIO NECK CODE STROKE  Result Date:  10/06/2020 CLINICAL DATA:  Neuro deficit, acute, stroke suspected EXAM: CT ANGIOGRAPHY HEAD AND NECK TECHNIQUE: Multidetector CT imaging of the head and neck was performed using the standard protocol during bolus administration of intravenous contrast. Multiplanar CT image reconstructions and MIPs were obtained to evaluate the vascular anatomy. Carotid stenosis measurements (when applicable) are obtained utilizing NASCET criteria, using the distal internal carotid diameter as the denominator. CONTRAST:  1042mOMNIPAQUE IOHEXOL 350 MG/ML SOLN COMPARISON:  Same day CT head. FINDINGS: CTA NECK FINDINGS Aortic arch: Great vessel origins are patent. Right carotid system: No evidence of dissection, stenosis (50% or greater) or occlusion. Left carotid system: No evidence of dissection, stenosis (50% or greater) or occlusion. Vertebral arteries: Limited evaluation proximally due to extensive streak artifact from adjacent venous contrast Skeleton: Mild-to-moderate multilevel degenerative change. Other neck: Suspected narrowing of  the left subclavian vein as it courses between the sternum and aorta at with reflux of venous contrast into small veins in the neck. No acute findings in the neck. The Upper chest: Mild dependent ground-glass opacities, likely atelectasis. Otherwise, lung apices are clear. Review of the MIP images confirms the above findings CTA HEAD FINDINGS Anterior circulation: Calcific atherosclerosis of bilateral intracranial ICAs with mild narrowing. Bilateral MCAs and ACAs are patent without proximal hemodynamically significant stenosis. No aneurysm identified. Posterior circulation: Bilateral intradural vertebral arteries, basilar artery, and posterior cerebral arteries are patent without proximal hemodynamically significant stenosis. Venous sinuses: As permitted by contrast timing, patent. Anatomic variants: None Review of the MIP images confirms the above findings IMPRESSION: No large vessel occlusion or  proximal hemodynamically significant stenosis in the head or neck. Findings discussed with Dr. Cheral Marker via telephone at 7:34 p.m. Electronically Signed   By: Margaretha Sheffield MD   On: 10/06/2020 19:47    Microbiology: Recent Results (from the past 240 hour(s))  Resp Panel by RT-PCR (Flu Simran Bomkamp&B, Covid) Nasopharyngeal Swab     Status: None   Collection Time: 10/06/20  6:44 PM   Specimen: Nasopharyngeal Swab; Nasopharyngeal(NP) swabs in vial transport medium  Result Value Ref Range Status   SARS Coronavirus 2 by RT PCR NEGATIVE NEGATIVE Final    Comment: (NOTE) SARS-CoV-2 target nucleic acids are NOT DETECTED.  The SARS-CoV-2 RNA is generally detectable in upper respiratory specimens during the acute phase of infection. The lowest concentration of SARS-CoV-2 viral copies this assay can detect is 138 copies/mL. Raja Liska negative result does not preclude SARS-Cov-2 infection and should not be used as the sole basis for treatment or other patient management decisions. Sagan Wurzel negative result may occur with  improper specimen collection/handling, submission of specimen other than nasopharyngeal swab, presence of viral mutation(s) within the areas targeted by this assay, and inadequate number of viral copies(<138 copies/mL). Angalina Ante negative result must be combined with clinical observations, patient history, and epidemiological information. The expected result is Negative.  Fact Sheet for Patients:  EntrepreneurPulse.com.au  Fact Sheet for Healthcare Providers:  IncredibleEmployment.be  This test is no t yet approved or cleared by the [place] FDA and  has been authorized for detection and/or diagnosis of SARS-CoV-2 by FDA under an Emergency Use Authorization (EUA). This EUA will remain  in effect (meaning this test can be used) for the duration of the COVID-19 declaration under Section 564(b)(1) of the Act, 21 U.S.C.section 360bbb-3(b)(1), unless the authorization is  terminated  or revoked sooner.       Influenza Gwyn Hieronymus by PCR NEGATIVE NEGATIVE Final   Influenza B by PCR NEGATIVE NEGATIVE Final    Comment: (NOTE) The Xpert Xpress SARS-CoV-2/FLU/RSV plus assay is intended as an aid in the diagnosis of influenza from Nasopharyngeal swab specimens and should not be used as Jadeyn Hargett sole basis for treatment. Nasal washings and aspirates are unacceptable for Xpert Xpress SARS-CoV-2/FLU/RSV testing.  Fact Sheet for Patients: EntrepreneurPulse.com.au  Fact Sheet for Healthcare Providers: IncredibleEmployment.be  This test is not yet approved or cleared by the [place] FDA and has been authorized for detection and/or diagnosis of SARS-CoV-2 by FDA under an Emergency Use Authorization (EUA). This EUA will remain in effect (meaning this test can be used) for the duration of the COVID-19 declaration under Section 564(b)(1) of the Act, 21 U.S.C. section 360bbb-3(b)(1), unless the authorization is terminated or revoked.  Performed at [facility], [address] [place], [place] [address]  Labs: Basic Metabolic Panel: Recent Labs  Lab 10/06/20 1844  NA 132*  K 3.9  CL 96*  CO2 27  GLUCOSE 148*  BUN 10  CREATININE 0.59  CALCIUM 9.4   Liver Function Tests: Recent Labs  Lab 10/06/20 1844  AST 23  ALT 21  ALKPHOS 74  BILITOT 0.2*  PROT 8.1  ALBUMIN 3.2*   No results for input(s): LIPASE, AMYLASE in the last 168 hours. No results for input(s): AMMONIA in the last 168 hours. CBC: Recent Labs  Lab 10/06/20 1844  WBC 7.0  NEUTROABS 5.3  HGB 10.5*  HCT 33.7*  MCV 77.1*  PLT 415*   Cardiac Enzymes: No results for input(s): CKTOTAL, CKMB, CKMBINDEX, TROPONINI in the last 168 hours. BNP: BNP (last 3 results) No results for input(s): BNP in the last 8760 hours.  ProBNP (last 3 results) No results for input(s): PROBNP in the last 8760 hours.  CBG: Recent Labs  Lab 10/06/20 1826   GLUCAP 145*       Signed:  Fayrene Helper MD.  [facility] 10/10/2020, 7:06 PM

## 2020-10-11 NOTE — Progress Notes (Signed)
Assessment/Plan:   1.  Suspect probable TIA  CT negative  -MRI negative  -Echocardiogram with ejection fraction of 60 to 65%.  -Talked about stroke risk factors and modifiable risk factors  -Talked about importance of blood pressure control with a goal <130/80 mm Hg.   -Talked about importance of lipid control and proper diet.  Lipids should be managed intensively, with a goal LDL < 70 mg/dL.  Her LDL is currently 129.  She was just placed on pravastatin (was not able to tolerate other statins).  She states that she is already having muscle pain.  She wants to go off of it but told her to try and hold off d/c if myalgia not to bad and see how she does.  Reports that she has tried crestor in the past.  -Patient now on aspirin/Plavix.  She will only be on dual antiplatelet for 3 weeks and then she will drop the Plavix  -I counseled the patient on measures to reduce stroke risk, including the importance of medication compliance, risk factor control, exercise, healthy diet, and avoidance of smoking.    -discussed importance of calling 911 should any neuro sx's happen again.  She is aware and agreeable.  -recommend 30 day cardiac monitor.  Pt aware and got the monitor today  -pt thought sx's were anxiety attack but I told her I didn't think so.  Sx's not consistent with that.   Subjective:   Ariel [name] was seen today in neurologic consultation at the request of Elodia Florence.,*.  The consultation is for the evaluation of possible stroke versus complicated migraine.  Hospital records are reviewed.  The event occurred on October 06, 2020.  Patient had been out with her granddaughter and was doing fine/neurologically normal.  Around 1:30 PM (right before she left her granddaughter), she noted that she said an "abnormal word."  Pt drove home without trouble.  Pt was trying to text and the text didn't make sense and son in law stated that it appeared almost like a "drunk text."  Her daughter  called her around 5 PM and noted that she had trouble getting her words out.  Her son was contacted and he came to the home and noted that her speech was nonsensical.  Chart stated that she didn't recognize her son but she did.  He drove her to the emergency room.  Emergency room nurse noted expressive aphasia.  Per emergency room records "does not understand instructions such as lift up your legs she would lift up her arm.  When asked what her name is she responds with nonsensical sounds that are not words.  However when I am mimic motions for her she is able to follow them."  Symptoms completely resolved within 5 to 6 hours.  "Once they gave me that aspirin, I came around."  She had an EEG which demonstrated intermittent slowing in the left frontotemporal region.  There was no epileptiform activity.  CT was nonacute.  MRI did not demonstrate acute infarct.  Echocardiogram demonstrated an ejection fraction of 60 to 65%.  Patient's LDL was 129.  Hemoglobin A1c was 6.0.  Patient was placed on aspirin and Plavix (recommended for 3 weeks and then aspirin alone).  Patient had previously not been on any antiplatelet medications.  Patient was placed on pravastatin (prior difficulty was tolerating statin medications)   ALLERGIES:   Allergies  Allergen Reactions   Codeine Nausea Only    CURRENT MEDICATIONS:  Outpatient Encounter Medications  as of 10/13/2020  Medication Sig   acetaminophen (TYLENOL) 500 MG tablet Take 500-1,000 mg by mouth every 6 (six) hours as needed (for pain).   ascorbic acid (VITAMIN C) 500 MG tablet Take 500 mg by mouth daily.   aspirin EC 81 MG EC tablet Take 1 tablet (81 mg total) by mouth daily. Swallow whole.   clopidogrel (PLAVIX) 75 MG tablet Take 1 tablet (75 mg total) by mouth daily for 21 days.   Ferrous Sulfate (SLOW FE PO) Take 1 tablet by mouth daily.   Glycerin-Hypromellose-PEG 400 (VISINE DRY EYE OP) Place 1 drop into both eyes daily.   pravastatin (PRAVACHOL) 20 MG  tablet Take 1 tablet (20 mg total) by mouth every evening. Please let your physician know if you develop muscle aches or issues tolerating this statin   No facility-administered encounter medications on file as of 10/13/2020.    Objective:   PHYSICAL EXAMINATION:    VITALS:   Vitals:   10/13/20 0833  BP: 137/79  Pulse: 96  SpO2: 97%  Weight: 142 lb 9.6 oz (64.7 kg)  Height: '5\' 6"'$  (1.676 m)    GEN:  Normal appears female in no acute distress.  Appears stated age. HEENT:  Normocephalic, atraumatic. The mucous membranes are moist. The superficial temporal arteries are without ropiness or tenderness. Cardiovascular: Regular rate and rhythm. Lungs: Clear to auscultation bilaterally. Neck/Heme: There are no carotid bruits noted bilaterally.  NEUROLOGICAL: Orientation:  The patient is alert and oriented x 3.   Cranial nerves: There is good facial symmetry.  Extraocular muscles are intact and visual fields are full to confrontational testing. Speech is fluent and clear. Soft palate rises symmetrically and there is no tongue deviation. Hearing is intact to conversational tone. Tone: Tone is good throughout. Sensation: Sensation is intact to light touch throughout (facial, trunk, extremities). Vibration is intact at the bilateral big toe. There is no extinction with double simultaneous stimulation. There is no sensory dermatomal level identified. Coordination:  The patient has no difficulty with RAM's or FNF bilaterally. Motor: Strength is 5/5 in the bilateral upper and lower extremities.  Shoulder shrug is equal and symmetric. There is no pronator drift.  There are no fasciculations noted. DTR's: Deep tendon reflexes are 2/4 at the bilateral biceps, triceps, brachioradialis, patella and achilles.  Plantar responses are downgoing bilaterally. Gait and Station: The patient is able to ambulate without difficulty. The patient is able to heel toe walk without any difficulty. The patient is able to  ambulate in a tandem fashion. The patient is able to stand in the Romberg position.    Total time spent on today's visit was 45 minutes, including both face-to-face time and nonface-to-face time.  Time included that spent on review of records (prior notes available to me/labs/imaging if pertinent), discussing treatment and goals, answering patient's questions and coordinating care.   Cc:  [name], NP

## 2020-10-13 ENCOUNTER — Encounter: Payer: Self-pay | Admitting: Neurology

## 2020-10-13 ENCOUNTER — Ambulatory Visit (INDEPENDENT_AMBULATORY_CARE_PROVIDER_SITE_OTHER): Payer: Medicare Other

## 2020-10-13 ENCOUNTER — Other Ambulatory Visit: Payer: Self-pay

## 2020-10-13 ENCOUNTER — Ambulatory Visit: Payer: Medicare Other | Admitting: Neurology

## 2020-10-13 VITALS — BP 137/79 | HR 96 | Ht 66.0 in | Wt 142.6 lb

## 2020-10-13 DIAGNOSIS — I4891 Unspecified atrial fibrillation: Secondary | ICD-10-CM

## 2020-10-13 DIAGNOSIS — G459 Transient cerebral ischemic attack, unspecified: Secondary | ICD-10-CM

## 2020-10-13 NOTE — Patient Instructions (Signed)
Stop your plavix 21 days after it started.  Continue Aspirin, EC, 81 mg daily Try to continue your pravastatin Exercise and follow a low carb/low fat diet. 4.  Call 911 and go to ER immediately should similar symptoms happen in the future.  It was my pure pleasure to see you today!    The physicians and staff at [facility] are committed to providing excellent care. You may receive a survey requesting feedback about your experience at our office. We strive to receive "very good" responses to the survey questions. If you feel that your experience would prevent you from giving the office a "very good " response, please contact our office to try to remedy the situation. We may be reached at [phone]. Thank you for taking the time out of your busy day to complete the survey.

## 2020-10-14 ENCOUNTER — Encounter: Payer: Self-pay | Admitting: Family

## 2020-10-18 ENCOUNTER — Encounter: Payer: Self-pay | Admitting: Family

## 2020-10-18 ENCOUNTER — Ambulatory Visit (HOSPITAL_BASED_OUTPATIENT_CLINIC_OR_DEPARTMENT_OTHER): Admit: 2020-10-18 | Payer: Medicare Other | Admitting: Obstetrics and Gynecology

## 2020-10-18 ENCOUNTER — Encounter (HOSPITAL_BASED_OUTPATIENT_CLINIC_OR_DEPARTMENT_OTHER): Payer: Self-pay

## 2020-10-18 SURGERY — INSERTION, SACRAL NERVE STIMULATOR, INTERSTIM, STAGE 2
Anesthesia: Monitor Anesthesia Care

## 2020-10-21 ENCOUNTER — Encounter: Payer: Self-pay | Admitting: Family

## 2020-10-21 DIAGNOSIS — Z789 Other specified health status: Secondary | ICD-10-CM

## 2020-11-05 ENCOUNTER — Inpatient Hospital Stay: Payer: Medicare Other | Admitting: Family

## 2020-11-08 ENCOUNTER — Encounter: Payer: Self-pay | Admitting: Obstetrics and Gynecology

## 2020-11-08 DIAGNOSIS — H5310 Unspecified subjective visual disturbances: Secondary | ICD-10-CM | POA: Diagnosis not present

## 2020-11-08 DIAGNOSIS — H5340 Unspecified visual field defects: Secondary | ICD-10-CM | POA: Diagnosis not present

## 2020-11-10 ENCOUNTER — Other Ambulatory Visit: Payer: Self-pay

## 2020-11-10 ENCOUNTER — Encounter: Payer: Self-pay | Admitting: Family

## 2020-11-10 ENCOUNTER — Telehealth: Payer: Self-pay | Admitting: Family

## 2020-11-10 ENCOUNTER — Ambulatory Visit (INDEPENDENT_AMBULATORY_CARE_PROVIDER_SITE_OTHER): Payer: Medicare Other | Admitting: Family

## 2020-11-10 VITALS — BP 115/62 | HR 111 | Temp 99.1°F | Resp 16 | Wt 142.0 lb

## 2020-11-10 DIAGNOSIS — G459 Transient cerebral ischemic attack, unspecified: Secondary | ICD-10-CM | POA: Diagnosis not present

## 2020-11-10 DIAGNOSIS — R21 Rash and other nonspecific skin eruption: Secondary | ICD-10-CM | POA: Diagnosis not present

## 2020-11-10 DIAGNOSIS — Z1211 Encounter for screening for malignant neoplasm of colon: Secondary | ICD-10-CM | POA: Diagnosis not present

## 2020-11-10 DIAGNOSIS — R000 Tachycardia, unspecified: Secondary | ICD-10-CM | POA: Insufficient documentation

## 2020-11-10 DIAGNOSIS — E0590 Thyrotoxicosis, unspecified without thyrotoxic crisis or storm: Secondary | ICD-10-CM | POA: Diagnosis not present

## 2020-11-10 DIAGNOSIS — R739 Hyperglycemia, unspecified: Secondary | ICD-10-CM

## 2020-11-10 DIAGNOSIS — D649 Anemia, unspecified: Secondary | ICD-10-CM | POA: Insufficient documentation

## 2020-11-10 HISTORY — DX: Hyperglycemia, unspecified: R73.9

## 2020-11-10 LAB — CBC WITH DIFFERENTIAL/PLATELET
Basophils Absolute: 0 10*3/uL (ref 0.0–0.1)
Basophils Relative: 0.1 % (ref 0.0–3.0)
Eosinophils Absolute: 0 10*3/uL (ref 0.0–0.7)
Eosinophils Relative: 0.3 % (ref 0.0–5.0)
HCT: 34.7 % — ABNORMAL LOW (ref 36.0–46.0)
Hemoglobin: 11.1 g/dL — ABNORMAL LOW (ref 12.0–15.0)
Lymphocytes Relative: 10.8 % — ABNORMAL LOW (ref 12.0–46.0)
Lymphs Abs: 1 10*3/uL (ref 0.7–4.0)
MCHC: 31.8 g/dL (ref 30.0–36.0)
MCV: 74.1 fl — ABNORMAL LOW (ref 78.0–100.0)
Monocytes Absolute: 0.2 10*3/uL (ref 0.1–1.0)
Monocytes Relative: 2.5 % — ABNORMAL LOW (ref 3.0–12.0)
Neutro Abs: 7.9 10*3/uL — ABNORMAL HIGH (ref 1.4–7.7)
Neutrophils Relative %: 86.3 % — ABNORMAL HIGH (ref 43.0–77.0)
Platelets: 551 10*3/uL — ABNORMAL HIGH (ref 150.0–400.0)
RBC: 4.69 Mil/uL (ref 3.87–5.11)
RDW: 16.9 % — ABNORMAL HIGH (ref 11.5–15.5)
WBC: 9.2 10*3/uL (ref 4.0–10.5)

## 2020-11-10 LAB — COMPREHENSIVE METABOLIC PANEL
ALT: 15 U/L (ref 0–35)
AST: 14 U/L (ref 0–37)
Albumin: 3.1 g/dL — ABNORMAL LOW (ref 3.5–5.2)
Alkaline Phosphatase: 65 U/L (ref 39–117)
BUN: 7 mg/dL (ref 6–23)
CO2: 29 mEq/L (ref 19–32)
Calcium: 9.2 mg/dL (ref 8.4–10.5)
Chloride: 94 mEq/L — ABNORMAL LOW (ref 96–112)
Creatinine, Ser: 0.65 mg/dL (ref 0.40–1.20)
GFR: 91.65 mL/min (ref >60.00)
Glucose, Bld: 129 mg/dL — ABNORMAL HIGH (ref 70–99)
Potassium: 4.4 mEq/L (ref 3.5–5.1)
Sodium: 132 mEq/L — ABNORMAL LOW (ref 135–145)
Total Bilirubin: 0.3 mg/dL (ref 0.2–1.2)
Total Protein: 6.9 g/dL (ref 6.0–8.3)

## 2020-11-10 LAB — FERRITIN: Ferritin: 145.5 ng/mL (ref 10.0–291.0)

## 2020-11-10 LAB — IRON: Iron: 17 ug/dL — ABNORMAL LOW (ref 42–145)

## 2020-11-10 LAB — TSH: TSH: 0.75 u[IU]/mL (ref 0.35–5.50)

## 2020-11-10 MED ORDER — BETAMETHASONE DIPROPIONATE 0.05 % EX CREA: TOPICAL_CREAM | Freq: Two times a day (BID) | CUTANEOUS | 1 refills | Status: DC

## 2020-11-10 NOTE — Progress Notes (Signed)
Subjective:   By signing my name below, I, Shehryar Baig, attest that this documentation has been prepared under the direction and in the presence of [name] NP. 11/10/2020     Patient ID: Ariel [name], female    DOB: February 25, 1956, 65 y.o.   MRN: [id number]  Chief Complaint  Patient presents with  . Follow-up    Follow up was seen in hospital for possible TIA    HPI Patient is in today for a hospital follow up visit. She was admitted to the hospital on 10/06/2020 for a TIA and discharged the next day on 10/07/2020. She was diagnosed with TIA and is currently affected by aphasia and iron deficiency anemia. She was recommended to take aspirin and Plavix for 3 weeks and then recommended to take only aspirin. She was also recommended to visit a cardiologist for outpatient heart monitor.   Heart monitor- She did not wear the heart monitor due to it malfunctioning and the company not reaching out to her to fix it.  TIA- She thinks her TIA was caused by increased stress.  Heart rate- She reports elevated heart rate for the past couple of months. She notices that her heart rate is around 120 in the morning and decreases through out the day. She is willing to see her cardiologist to manage her symptoms.  Rash- She complains of a itchy rash on her upper extremities for the wast couple of weeks. She notes that the rash presented after she drank Gatorade while in [place] and the beginning of this week. Both flare ups presented after drinking Gatorade. She is taking benadryl to manage her symptoms but prefers something that will not cause drowsiness.  Pravastatin- She reports having easy bruising while taking 20 mg pravastatin daily PO. She only took it for 18 days then stopped due to the bruising. Anemia- She reports that her mother had a history of anemia at the age of 55. She denies having any bleeding recently. She is interested in getting a colonoscopy to check for blood.  Fatigue/Anxiety-  She complains of fatigue. Her symptoms were present prior to her TIA. She is seeing a counselor to manage her symptoms and finds relief  Blood sugar- Her blood sugar was elevated during her last lab work. Her father has a history of diabetes. She is adjusting her diet to control her blood sugars.   Lab Results  Component Value Date   HGBA1C 6.0 (H) 10/07/2020   Blood pressure- Her blood pressure is doing well during this visit. Her heart rate is slightly elevated.   BP Readings from Last 3 Encounters:  11/10/20 115/62  10/13/20 137/79  10/07/20 (!) 101/56   Pulse Readings from Last 3 Encounters:  11/10/20 (!) 111  10/13/20 96  10/07/20 72   Immunizations- She is not interested in getting a flu vaccine during this visit.   Health Maintenance Due  Topic Date Due  . COLONOSCOPY (Pts 45-48yr Insurance coverage will need to be confirmed)  03/02/2018  . COVID-19 Vaccine (2 - Janssen risk series) 06/27/2019  . INFLUENZA VACCINE  09/27/2020    Past Medical History:  Diagnosis Date  . Chicken pox   . Complication of anesthesia    Per patient, very slow to wake up after anesthesia  . High cholesterol   . Hyperglycemia 11/10/2020  . Melanoma ([place] 2021   right hip  . PONV (postoperative nausea and vomiting)   . Urinary incontinence     Past Surgical History:  Procedure  Laterality Date  . APPENDECTOMY  1962  . EXCISION MELANOMA WITH SENTINEL LYMPH NODE BIOPSY Right 10/09/2019   Procedure: WIDE LOCAL EXCISION WITH ADVANCEMENT FLAP CLOSURE RIGHT HIP MELANOMA WITH SENTINEL LYMPH NODE BIOPSY;  Surgeon: Stark Klein, MD;  Location: [facility];  Service: General;  Laterality: Right;  . HERNIA REPAIR  2009  . MOUTH SURGERY  11/2015   gum surgery and bone implant  . WISDOM TOOTH EXTRACTION      Family History  Problem Relation Age of Onset  . Coronary artery disease Father        died at 70  . COPD Father   . Alcohol abuse Father   . Diabetes Mellitus II Father   . Coronary artery  disease Sister   . Cancer Sister        breast  . Heart attack Brother   . Hyperlipidemia Other        died 53- "natural causes"  . Hyperlipidemia Other        4 siblings   . Hypertension Other        3 siblings    Social History   Socioeconomic History  . Marital status: Widowed    Spouse name: Not on file  . Number of children: Not on file  . Years of education: Not on file  . Highest education level: Not on file  Occupational History  . Occupation: retired  Tobacco Use  . Smoking status: Never  . Smokeless tobacco: Never  Vaping Use  . Vaping Use: Never used  Substance and Sexual Activity  . Alcohol use: Never  . Drug use: Never  . Sexual activity: Not Currently  Other Topics Concern  . Not on file  Social History Narrative   Lives with husband   One Son- lives locally, 1 grand-daughter   Daughter- Sport and exercise psychologist. [place]   Completed Hotel manager school   Works as a Probation officer.         Social Determinants of Radio broadcast assistant Strain: [place]   . Difficulty of Paying Living Expenses: Not hard at all  Food Insecurity: No Food Insecurity  . Worried About Charity fundraiser in the Last Year: Never true  . Ran Out of Food in the Last Year: Never true  Transportation Needs: No Transportation Needs  . Lack of Transportation (Medical): No  . Lack of Transportation (Non-Medical): No  Physical Activity: Sufficiently Active  . Days of Exercise per Week: 4 days  . Minutes of Exercise per Session: 40 min  Stress: No Stress Concern Present  . Feeling of Stress : Not at all  Social Connections: Moderately Isolated  . Frequency of Communication with Friends and Family: More than three times a week  . Frequency of Social Gatherings with Friends and Family: More than three times a week  . Attends Religious Services: More than 4 times per year  . Active Member of Clubs or Organizations: No  . Attends Archivist Meetings: Never  . Marital Status: Widowed   Intimate Partner Violence: Not At Risk  . Fear of Current or Ex-Partner: No  . Emotionally Abused: No  . Physically Abused: No  . Sexually Abused: No    Outpatient Medications Prior to Visit  Medication Sig Dispense Refill  . acetaminophen (TYLENOL) 500 MG tablet Take 500-1,000 mg by mouth every 6 (six) hours as needed (for pain).    Marland Kitchen ascorbic acid (VITAMIN C) 500 MG tablet Take 500 mg by mouth  daily.    . aspirin EC 81 MG tablet Take 81 mg by mouth daily. Swallow whole.    . Ferrous Sulfate (SLOW FE PO) Take 1 tablet by mouth every other day.    . Glycerin-Hypromellose-PEG 400 (VISINE DRY EYE OP) Place 1 drop into both eyes daily.    . pravastatin (PRAVACHOL) 20 MG tablet Take 1 tablet (20 mg total) by mouth every evening. Please let your physician know if you develop muscle aches or issues tolerating this statin 30 tablet 1   No facility-administered medications prior to visit.    Allergies  Allergen Reactions  . Codeine Nausea Only    Review of Systems  Cardiovascular:        (+)elevated heart rate in the morning  Skin:  Positive for itching (on rash on upper extremities) and rash (red rash on upper extremities).      Objective:    Physical Exam Constitutional:      General: She is not in acute distress.    Appearance: Normal appearance. She is not ill-appearing.  HENT:     Head: Normocephalic and atraumatic.     Right Ear: External ear normal.     Left Ear: External ear normal.  Eyes:     Extraocular Movements: Extraocular movements intact.     Pupils: Pupils are equal, round, and reactive to light.  Cardiovascular:     Rate and Rhythm: Regular rhythm.     Heart sounds: Normal heart sounds. No murmur heard.   No gallop.     Comments: (+)increased heart rate Pulmonary:     Effort: Pulmonary effort is normal. No respiratory distress.     Breath sounds: Normal breath sounds. No wheezing or rales.  Lymphadenopathy:     Cervical: No cervical adenopathy.  Skin:     General: Skin is warm and dry.  Neurological:     Mental Status: She is alert and oriented to person, place, and time.  Psychiatric:        Behavior: Behavior normal.        Judgment: Judgment normal.    BP 115/62 (BP Location: Right Arm, Patient Position: Sitting, Cuff Size: Small)   Pulse (!) 111   Temp 99.1 F (37.3 C) (Oral)   Resp 16   Wt 142 lb (64.4 kg)   SpO2 99%   BMI 22.92 kg/m  Wt Readings from Last 3 Encounters:  11/10/20 142 lb (64.4 kg)  10/13/20 142 lb 9.6 oz (64.7 kg)  10/07/20 141 lb 3.2 oz (64 kg)       Assessment & Plan:   Problem List Items Addressed This Visit       Unprioritized   TIA (transient ischemic attack)    She completed plavix x 3 weeks. Continue aspirin 28m once daily for secondary prevention.  She stopped the pravastatin due to bruising, however the bruising was most likely due to the plavix/asa combo.  Will see if she is willing to retry.       Relevant Medications   aspirin EC 81 MG tablet   Tachycardia    EKG tracing is personally reviewed.  EKG notes NSR.  No acute changes.   She is waiting on cardiac monitor results.  I will refer her back to cardiology for consultation as well. She has seen Dr. MBettina Gaviain the past.       Relevant Orders   Ambulatory referral to Cardiology   Comp Met (CMET)   TSH   EKG 12-Lead (Completed)  Subclinical hyperthyroidism    Lab Results  Component Value Date   TSH 0.64 06/06/2017  With her tachycardia, I would like to check a follow up TSH.       Skin rash - Primary    New.  Recommended PO vs. Topical steroid. She opted for topical. Rx sent for betamethasone 0.5 %.        Hyperglycemia    A1C 6.0.  She is working on limiting sugar and carbs in her diet.       Anemia    Last CBC Lab Results  Component Value Date   WBC 7.0 10/06/2020   HGB 10.5 (L) 10/06/2020   HCT 33.7 (L) 10/06/2020   MCV 77.1 (L) 10/06/2020   MCH 24.0 (L) 10/06/2020   RDW 15.4 10/06/2020   PLT 415 (H)  10/06/2020  Baseline HgB 13, not clear why H/H has dropped.  Check iron levels, repeat CBC, obtain IFOB.  Continue iron PO QOD.       Relevant Orders   Fecal occult blood, imunochemical   CBC with Differential/Platelet   Iron   Ferritin   Other Visit Diagnoses     Colon cancer screening       Relevant Orders   Ambulatory referral to Gastroenterology        Meds ordered this encounter  Medications  . betamethasone dipropionate 0.05 % cream    Sig: Apply topically 2 (two) times daily.    Dispense:  30 g    Refill:  1    Order Specific Question:   Supervising Provider    Answer:   Penni Homans A [[id number]]   40 minutes were spent on today's visit.  The majority of this time was spent reviewing patient records, counseling/interviewing patient and formulating medial plan.  I, [name] NP, personally preformed the services described in this documentation.  All medical record entries made by the scribe were at my direction and in my presence.  I have reviewed the chart and discharge instructions (if applicable) and agree that the record reflects my personal performance and is accurate and complete. 11/10/2020   I,Shehryar Baig,acting as a scribe for Nance Pear, NP.,have documented all relevant documentation on the behalf of Nance Pear, NP,as directed by  Nance Pear, NP while in the presence of Nance Pear, NP.   Nance Pear, NP

## 2020-11-10 NOTE — Patient Instructions (Addendum)
Please complete lab work prior to leaving. Complete stool kit at your earliest convenience.  Continue aspirin daily.  You should be contacted about scheduling your appointment with Dr. Bettina Gavia.   Continue your iron supplement every other day.

## 2020-11-10 NOTE — Assessment & Plan Note (Signed)
She completed plavix x 3 weeks. Continue aspirin '81mg'$  once daily for secondary prevention.  She stopped the pravastatin due to bruising, however the bruising was most likely due to the plavix/asa combo.  Will see if she is willing to retry.

## 2020-11-10 NOTE — Assessment & Plan Note (Signed)
Lab Results  Component Value Date   TSH 0.64 06/06/2017   With her tachycardia, I would like to check a follow up TSH.

## 2020-11-10 NOTE — Assessment & Plan Note (Signed)
New.  Recommended PO vs. Topical steroid. She opted for topical. Rx sent for betamethasone 0.5 %.

## 2020-11-10 NOTE — Telephone Encounter (Signed)
See mychart.  

## 2020-11-10 NOTE — Assessment & Plan Note (Signed)
A1C 6.0.  She is working on limiting sugar and carbs in her diet.

## 2020-11-10 NOTE — Assessment & Plan Note (Signed)
EKG tracing is personally reviewed.  EKG notes NSR.  No acute changes.   She is waiting on cardiac monitor results.  I will refer her back to cardiology for consultation as well. She has seen Dr. Bettina Gavia in the past.

## 2020-11-10 NOTE — Assessment & Plan Note (Signed)
Last CBC Lab Results  Component Value Date   WBC 7.0 10/06/2020   HGB 10.5 (L) 10/06/2020   HCT 33.7 (L) 10/06/2020   MCV 77.1 (L) 10/06/2020   MCH 24.0 (L) 10/06/2020   RDW 15.4 10/06/2020   PLT 415 (H) 10/06/2020   Baseline HgB 13, not clear why H/H has dropped.  Check iron levels, repeat CBC, obtain IFOB.  Continue iron PO QOD.

## 2020-11-12 ENCOUNTER — Telehealth (INDEPENDENT_AMBULATORY_CARE_PROVIDER_SITE_OTHER): Payer: Medicare Other | Admitting: Obstetrics and Gynecology

## 2020-11-12 DIAGNOSIS — N3281 Overactive bladder: Secondary | ICD-10-CM | POA: Diagnosis not present

## 2020-11-12 NOTE — Progress Notes (Signed)
[facility] Return Video Visit  The patient consents to a video visit. She is located at home in [facility] and I am at the urogynecology office in [place], [place].   SUBJECTIVE  History of Present Illness: Ariel [name] is a 65 y.o. female seen in follow-up for overactive bladder.   Has had a TIA recently. Also found out she had anemia. Her leakage is still an issue. Voiding very frequently and having leakage multiple times a day with urgency. She had previously wanted to do intersitm but chose to delay surgery. Now feels with her new medical issues that surgery is not an option for her at this time.   Past Medical History: Patient  has a past medical history of Chicken pox, Complication of anesthesia, High cholesterol, Hyperglycemia (11/10/2020), Melanoma ([place]) (2021), PONV (postoperative nausea and vomiting), and Urinary incontinence.   Past Surgical History: She  has a past surgical history that includes Appendectomy (1962); Hernia repair (2009); Mouth surgery (11/2015); Wisdom tooth extraction; and Excision melanoma with sentinel lymph node biopsy (Right, 10/09/2019).   Medications: She has a current medication list which includes the following prescription(s): acetaminophen, ascorbic acid, aspirin ec, betamethasone dipropionate, ferrous sulfate, and glycerin-hypromellose-peg 400.   Allergies: Patient is allergic to codeine.   Social History: Patient  reports that she has never smoked. She has never used smokeless tobacco. She reports that she does not drink alcohol and does not use drugs.      OBJECTIVE     Physical Exam: There were no vitals filed for this visit. Gen: No apparent distress    ASSESSMENT AND PLAN    Ariel [name] is a 65 y.o. with:  1. Overactive bladder      - We discussed the role of percutaneous tibial nerve stimulation and how it works.  She understands it requires 12 weekly visits for temporary neuromodulation of the sacral nerve roots via the  tibial nerve and that she may then require continued tapered treatment.   - Will need to fill out a new bladder diary at first treatment visit -She will return for the procedure. All questions were answered.  Jaquita Folds, MD  Time spent: I spent 15 minutes dedicated to the care of this patient on the date of this encounter to include pre-visit review of records, face-to-face time with the patient and post visit documentation

## 2020-11-14 ENCOUNTER — Telehealth: Payer: Self-pay | Admitting: Family

## 2020-11-14 DIAGNOSIS — E8809 Other disorders of plasma-protein metabolism, not elsewhere classified: Secondary | ICD-10-CM

## 2020-11-14 DIAGNOSIS — D509 Iron deficiency anemia, unspecified: Secondary | ICD-10-CM

## 2020-11-14 NOTE — Telephone Encounter (Signed)
Protein level in her blood is a little low.  I would like her to be sure to get plenty of lean protein in her diet.  Iron level is low but Hemoglobin is slightly better than when it was last checked.  I would like her to return to the lab in 1 month for labs as ordered.

## 2020-11-15 NOTE — Telephone Encounter (Signed)
Patient advised of results and provider's comments. She was scheduled to come back 12-14-20 for labs.

## 2020-11-16 ENCOUNTER — Other Ambulatory Visit: Payer: Self-pay | Admitting: Physician Assistant

## 2020-11-16 DIAGNOSIS — L719 Rosacea, unspecified: Secondary | ICD-10-CM | POA: Diagnosis not present

## 2020-11-16 DIAGNOSIS — I4891 Unspecified atrial fibrillation: Secondary | ICD-10-CM

## 2020-11-16 DIAGNOSIS — L821 Other seborrheic keratosis: Secondary | ICD-10-CM | POA: Diagnosis not present

## 2020-11-16 DIAGNOSIS — L905 Scar conditions and fibrosis of skin: Secondary | ICD-10-CM | POA: Diagnosis not present

## 2020-11-16 DIAGNOSIS — L814 Other melanin hyperpigmentation: Secondary | ICD-10-CM | POA: Diagnosis not present

## 2020-11-16 DIAGNOSIS — L578 Other skin changes due to chronic exposure to nonionizing radiation: Secondary | ICD-10-CM | POA: Diagnosis not present

## 2020-11-16 DIAGNOSIS — C4371 Malignant melanoma of right lower limb, including hip: Secondary | ICD-10-CM | POA: Diagnosis not present

## 2020-11-16 DIAGNOSIS — G459 Transient cerebral ischemic attack, unspecified: Secondary | ICD-10-CM

## 2020-11-22 DIAGNOSIS — R112 Nausea with vomiting, unspecified: Secondary | ICD-10-CM | POA: Insufficient documentation

## 2020-11-22 DIAGNOSIS — T8859XA Other complications of anesthesia, initial encounter: Secondary | ICD-10-CM | POA: Insufficient documentation

## 2020-11-22 DIAGNOSIS — Z9889 Other specified postprocedural states: Secondary | ICD-10-CM | POA: Insufficient documentation

## 2020-11-22 DIAGNOSIS — B019 Varicella without complication: Secondary | ICD-10-CM | POA: Insufficient documentation

## 2020-11-23 ENCOUNTER — Other Ambulatory Visit: Payer: Self-pay

## 2020-11-23 ENCOUNTER — Ambulatory Visit: Payer: Medicare Other | Admitting: Cardiology

## 2020-11-23 ENCOUNTER — Encounter: Payer: Self-pay | Admitting: Cardiology

## 2020-11-23 VITALS — BP 136/68 | HR 68 | Ht 66.0 in | Wt 140.1 lb

## 2020-11-23 DIAGNOSIS — G459 Transient cerebral ischemic attack, unspecified: Secondary | ICD-10-CM | POA: Diagnosis not present

## 2020-11-23 DIAGNOSIS — D509 Iron deficiency anemia, unspecified: Secondary | ICD-10-CM | POA: Diagnosis not present

## 2020-11-23 NOTE — Patient Instructions (Signed)

## 2020-11-23 NOTE — Progress Notes (Signed)
Cardiology Office Note:    Date:  11/23/2020   ID:  Ariel [name], DOB 11-25-1955, MRN [id number]  PCP:  [name], NP  Cardiologist:  Shirlee More, MD    Referring MD: [name], NP    ASSESSMENT:    1. TIA (transient ischemic attack)   2. Iron deficiency anemia, unspecified iron deficiency anemia type    PLAN:    In order of problems listed above:  Evaluation to date unremarkable now on antiplatelet therapy I think she should have a transesophageal echocardiogram and if left atrial appendage thrombus is present would benefit from anticoagulation.  She agrees wants to delay for a week or 2 until her iron deficiency is treated and she feels stronger.  She is assured me she will contact my office at that time   Next appointment: 3 months   Medication Adjustments/Labs and Tests Ordered: Current medicines are reviewed at length with the patient today.  Concerns regarding medicines are outlined above.  No orders of the defined types were placed in this encounter.  No orders of the defined types were placed in this encounter.   Chief complaint I had a recent TIA   History of Present Illness:    Ariel [name] is a 65 y.o. female with a hx of thyroid nodules hyperlipidemia hypertension type 2 diabetes mellitus last seen by me 10/09/2018.  Compliance with diet, lifestyle and medications: Yes  Prior to the visit I reviewed her extended ambulatory heart rhythm monitor and notes by neurology Dr. Wells Guiles Tat She appeared to have had a very typical TIA with expressive aphasia. She has fully recovered except at times she finds her self struggling to spell words which is a new finding She has very good healthcare literacy still is feeling weak and is scheduled to receive parenteral iron. I asked her to have a transesophageal echocardiogram performed if she had left atrial appendage thrombus would change her treatment. She just purchased a new version of the apple  watch and his heart rhythm screening on it and understands how to record strips. At this time I do not think she requires an implanted loop recorder  Had recent hospitalization with probable TIA and has been seen in follow-up by neurology or MRI negative CT negative echocardiogram without focus of cardiac embolism EF 60 to 65% and treated with aspirin and Plavix for 3 weeks and then will go to monotherapy with clopidogrel.  She had a 30-day event monitor showing no episodes of atrial fibrillation or flutter.  Study Highlights An extended event monitor was performed in 10/14/2018 to 11/11/2020 to assess arrhythmia in the context of recent hospitalization for TIA.   The rhythm throughout was sinus.  No episodes of atrial fibrillation or flutter.   There were no pauses of 3 seconds or greater and no episodes of second or third-degree AV nodal block or sinus node exit block.   Ventricular ectopy was rare.   Supraventricular ectopy was rare.   There were 7 triggered events for sinus rhythm 3 sinus tachycardia with a maximum rate of 112 bpm.     Conclusion unremarkable extended event monitor in the context of TIA. Past Medical History:  Diagnosis Date   Chicken pox    Complication of anesthesia    Per patient, very slow to wake up after anesthesia   High cholesterol    Hyperglycemia 11/10/2020   Melanoma ([place]) 2021   right hip   PONV (postoperative nausea and vomiting)    Urinary incontinence  Past Surgical History:  Procedure Laterality Date   APPENDECTOMY  1962   EXCISION MELANOMA WITH SENTINEL LYMPH NODE BIOPSY Right 10/09/2019   Procedure: WIDE LOCAL EXCISION WITH ADVANCEMENT FLAP CLOSURE RIGHT HIP MELANOMA WITH SENTINEL LYMPH NODE BIOPSY;  Surgeon: Stark Klein, MD;  Location: [facility];  Service: General;  Laterality: Right;   HERNIA REPAIR  2009   MOUTH SURGERY  11/2015   gum surgery and bone implant   WISDOM TOOTH EXTRACTION      Current Medications: Current Meds   Medication Sig   acetaminophen (TYLENOL) 500 MG tablet Take 500-1,000 mg by mouth every 6 (six) hours as needed (for pain).   ascorbic acid (VITAMIN C) 500 MG tablet Take 500 mg by mouth daily.   aspirin EC 81 MG tablet Take 81 mg by mouth daily. Swallow whole.   betamethasone dipropionate 0.05 % cream Apply topically 2 (two) times daily.   Ferrous Sulfate (IRON) 325 (65 Fe) MG TABS Take 325 mg by mouth daily.   Glycerin-Hypromellose-PEG 400 (VISINE DRY EYE OP) Place 1 drop into both eyes daily.     Allergies:   Codeine   Social History   Socioeconomic History   Marital status: Widowed    Spouse name: Not on file   Number of children: Not on file   Years of education: Not on file   Highest education level: Not on file  Occupational History   Occupation: retired  Tobacco Use   Smoking status: Never   Smokeless tobacco: Never  Vaping Use   Vaping Use: Never used  Substance and Sexual Activity   Alcohol use: Never   Drug use: Never   Sexual activity: Not Currently  Other Topics Concern   Not on file  Social History Narrative   Lives with husband   One Son- lives locally, 1 grand-daughter   Daughter- Sport and exercise psychologist. [place]   Completed Hotel manager school   Works as a Probation officer.         Social Determinants of Radio broadcast assistant Strain: Low Risk    Difficulty of Paying Living Expenses: Not hard at all  Food Insecurity: No Food Insecurity   Worried About Charity fundraiser in the Last Year: Never true   Arboriculturist in the Last Year: Never true  Transportation Needs: No Transportation Needs   Lack of Transportation (Medical): No   Lack of Transportation (Non-Medical): No  Physical Activity: Sufficiently Active   Days of Exercise per Week: 4 days   Minutes of Exercise per Session: 40 min  Stress: No Stress Concern Present   Feeling of Stress : Not at all  Social Connections: Moderately Isolated   Frequency of Communication with Friends and Family: More than  three times a week   Frequency of Social Gatherings with Friends and Family: More than three times a week   Attends Religious Services: More than 4 times per year   Active Member of [employer] or Organizations: No   Attends Archivist Meetings: Never   Marital Status: Widowed     Family History: The patient's family history includes Alcohol abuse in her father; COPD in her father; Cancer in her sister; Coronary artery disease in her father and sister; Diabetes Mellitus II in her father; Heart attack in her brother; Hyperlipidemia in some other family members; Hypertension in an other family member. ROS:   Please see the history of present illness.    All other systems reviewed and are negative.  EKGs/Labs/Other Studies Reviewed:    The following studies were reviewed today:    Recent Labs: 11/10/2020: ALT 15; BUN 7; Creatinine, Ser 0.65; Hemoglobin 11.1; Platelets 551.0; Potassium 4.4; Sodium 132; TSH 0.75  Recent Lipid Panel    Component Value Date/Time   CHOL 134 10/07/2020 0332   TRIG 36 10/07/2020 0332   HDL 44 10/07/2020 0332   CHOLHDL 3.0 10/07/2020 0332   VLDL 7 10/07/2020 0332   LDLCALC 83 10/07/2020 0332    Physical Exam:    VS:  BP 136/68 (BP Location: Right Arm, Patient Position: Sitting, Cuff Size: Small)   Pulse 68   Ht 5\' 6"  (1.676 m)   Wt 140 lb 1.9 oz (63.6 kg)   SpO2 98%   BMI 22.62 kg/m     Wt Readings from Last 3 Encounters:  11/23/20 140 lb 1.9 oz (63.6 kg)  11/10/20 142 lb (64.4 kg)  10/13/20 142 lb 9.6 oz (64.7 kg)     GEN:  Well nourished, well developed in no acute distress HEENT: Normal NECK: No JVD; No carotid bruits LYMPHATICS: No lymphadenopathy CARDIAC: RRR, no murmurs, rubs, gallops RESPIRATORY:  Clear to auscultation without rales, wheezing or rhonchi  ABDOMEN: Soft, non-tender, non-distended MUSCULOSKELETAL:  No edema; No deformity  SKIN: Warm and dry NEUROLOGIC:  Alert and oriented x 3 PSYCHIATRIC:  Normal affect     Signed, Shirlee More, MD  11/23/2020 10:43 AM    [facility]

## 2020-11-23 NOTE — Addendum Note (Signed)
Addended by: Kelle Darting A on: 11/23/2020 11:54 AM   Modules accepted: Orders

## 2020-11-24 ENCOUNTER — Other Ambulatory Visit (INDEPENDENT_AMBULATORY_CARE_PROVIDER_SITE_OTHER): Payer: Medicare Other

## 2020-11-24 DIAGNOSIS — D649 Anemia, unspecified: Secondary | ICD-10-CM

## 2020-11-24 LAB — FECAL OCCULT BLOOD, IMMUNOCHEMICAL: Fecal Occult Bld: NEGATIVE

## 2020-11-29 ENCOUNTER — Telehealth: Payer: Self-pay | Admitting: Family

## 2020-11-29 NOTE — Telephone Encounter (Signed)
Patient thinks she is dehydrated. She has night sweats, weakness, fever, and other symptoms associated with dehydration. No available appointments. Advised patient fluids may be needed. Transferred to Triage.

## 2020-11-29 NOTE — Telephone Encounter (Signed)
Nurse Assessment Nurse: Vallery Sa, RN, Jeily Date/Time (Eastern Time): 11/29/2020 11:06:08 AM Confirm and document reason for call. If symptomatic, describe symptoms. ---Ariel [name] states she doesn't want triage evaluation because she was triaged earlier today. She thinks she is dehydrated and may need to go to the ER for IV fluids. She states she passed urine 5 minutes ago. She declined triage evaluation and requests to be transferred to the office to check on appointment options. Encouraged to call back for any new/ worsening symptoms or if she has any concerns or questions. Transferred to [facility] via the office backline to check on appointment options. Does the patient have any new or worsening symptoms? ---Yes Will a triage be completed? ---No Select reason for no triage. ---Patient declined Disp. Time [name] Time) Disposition Final User 11/29/2020 11:15:25 AM Call Completed Vallery Sa, RN, [name] 11/29/2020 11:15:14 AM Clinical Call Yes Vallery Sa, RN, Ariel [name]

## 2020-11-29 NOTE — Telephone Encounter (Signed)
Nurse Assessment Nurse: D'Heur Lucia Gaskins, RN, [place] Date/Time (Eastern Time): 11/29/2020 10:04:57 AM Confirm and document reason for call. If symptomatic, describe symptoms. ---Caller states low iron; had heart tests-no diagnoses; feels dehydrated; weak and dizzy off & on; night sweats for past two weeks; She also had TIA in August. She has decreased appetite and lost weight. 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 She feels depleted. Does the patient have any new or worsening symptoms? ---Yes Will a triage be completed? ---Yes Related visit to physician within the last 2 weeks? ---No Does the PT have any chronic conditions? (i.e. diabetes, asthma, this includes High risk factors for pregnancy, etc.) ---Yes List chronic conditions. ---anemia, Hx of TIA, Is this a behavioral health or substance abuse call? ---No Guidelines Guideline Title Affirmed Question Affirmed Notes Nurse Date/Time (Eastern Time) Weakness (Generalized) and Fatigue [1] MODERATE weakness (i.e., interferes with work, school, normal activities) AND [2] persists > 3 days D'Heur Lucia Gaskins, RN, [facility] 11/29/2020 10:16:27 AM PLEASE NOTE: All timestamps contained within this report are represented as [place] Standard Time. CONFIDENTIALTY NOTICE: This fax transmission is intended only for the addressee. It contains information that is legally privileged, confidential or otherwise protected from use or disclosure. If you are not the intended recipient, you are strictly prohibited from reviewing, disclosing, copying using or disseminating any of this information or taking any action in reliance on or regarding this information. If you have received this fax in error, please notify us immediately by telephone so that we can arrange for its return to [place]. Phone: [phone], Toll-Free: [phone], Fax: [phone] Page: 2 of 2 Call Id: [id number] [place]. Time [name] Time)  Disposition Final User 11/29/2020 10:23:04 AM See PCP within 24 Hours Yes D'Heur Lucia Gaskins, RN, [facility] Caller Disagree/Comply Comply Caller Understands Yes PreDisposition Call Doctor Care Advice Given Per Guideline SEE PCP WITHIN 24 HOURS: * IF OFFICE WILL BE OPEN: You need to be examined within the next 24 hours. Call your doctor (or NP/PA) when the office opens and make an appointment. CALL BACK IF: * You become worse CARE ADVICE given per Weakness and Fatigue (Adult) guideline. Comments User: Vincente Liberty, D'Heur Lucia Gaskins, RN Date/Time [name] Time): 11/29/2020 10:16:20 AM Current O2SAT is 97%, pulse is 81 User: Vincente Liberty, D'Heur Lucia Gaskins, RN Date/Time [name] Time): 11/29/2020 10:20:48 AM Caller states she has been having fever of 101 during the night sweats. Referrals REFERRED TO PCP OFFICE

## 2020-11-29 NOTE — Telephone Encounter (Signed)
Patient scheduled to be seen tomorrow.

## 2020-11-30 ENCOUNTER — Other Ambulatory Visit: Payer: Self-pay

## 2020-11-30 ENCOUNTER — Telehealth: Payer: Self-pay | Admitting: Family

## 2020-11-30 ENCOUNTER — Ambulatory Visit (INDEPENDENT_AMBULATORY_CARE_PROVIDER_SITE_OTHER): Payer: Medicare Other | Admitting: Family

## 2020-11-30 VITALS — BP 117/61 | HR 70 | Temp 98.1°F | Resp 12 | Ht 66.0 in | Wt 143.6 lb

## 2020-11-30 DIAGNOSIS — R61 Generalized hyperhidrosis: Secondary | ICD-10-CM

## 2020-11-30 DIAGNOSIS — G459 Transient cerebral ischemic attack, unspecified: Secondary | ICD-10-CM | POA: Diagnosis not present

## 2020-11-30 DIAGNOSIS — R509 Fever, unspecified: Secondary | ICD-10-CM

## 2020-11-30 LAB — URINALYSIS, ROUTINE W REFLEX MICROSCOPIC
Bilirubin Urine: NEGATIVE
Ketones, ur: NEGATIVE
Leukocytes,Ua: NEGATIVE
Nitrite: NEGATIVE
Specific Gravity, Urine: <=1.005 — AB (ref 1.000–1.030)
Urine Glucose: NEGATIVE
Urobilinogen, UA: 0.2 (ref 0.0–1.0)
pH: 6.5 (ref 5.0–8.0)

## 2020-11-30 LAB — CBC WITH DIFFERENTIAL/PLATELET
Basophils Absolute: 0 10*3/uL (ref 0.0–0.1)
Basophils Relative: 0.5 % (ref 0.0–3.0)
Eosinophils Absolute: 0.1 10*3/uL (ref 0.0–0.7)
Eosinophils Relative: 2.4 % (ref 0.0–5.0)
HCT: 26.8 % — ABNORMAL LOW (ref 36.0–46.0)
Hemoglobin: 8.6 g/dL — ABNORMAL LOW (ref 12.0–15.0)
Lymphocytes Relative: 26.7 % (ref 12.0–46.0)
Lymphs Abs: 1.6 10*3/uL (ref 0.7–4.0)
MCHC: 31.9 g/dL (ref 30.0–36.0)
MCV: 74.1 fl — ABNORMAL LOW (ref 78.0–100.0)
Monocytes Absolute: 0.5 10*3/uL (ref 0.1–1.0)
Monocytes Relative: 8.9 % (ref 3.0–12.0)
Neutro Abs: 3.6 10*3/uL (ref 1.4–7.7)
Neutrophils Relative %: 61.5 % (ref 43.0–77.0)
Platelets: 415 10*3/uL — ABNORMAL HIGH (ref 150.0–400.0)
RBC: 3.62 Mil/uL — ABNORMAL LOW (ref 3.87–5.11)
RDW: 17.5 % — ABNORMAL HIGH (ref 11.5–15.5)
WBC: 5.9 10*3/uL (ref 4.0–10.5)

## 2020-11-30 LAB — HEPATIC FUNCTION PANEL
ALT: 27 U/L (ref 0–35)
AST: 24 U/L (ref 0–37)
Albumin: 3.1 g/dL — ABNORMAL LOW (ref 3.5–5.2)
Alkaline Phosphatase: 82 U/L (ref 39–117)
Bilirubin, Direct: 0.1 mg/dL (ref 0.0–0.3)
Total Bilirubin: 0.3 mg/dL (ref 0.2–1.2)
Total Protein: 7 g/dL (ref 6.0–8.3)

## 2020-11-30 LAB — C-REACTIVE PROTEIN: CRP: 20.9 mg/dL — ABNORMAL HIGH (ref 0.5–20.0)

## 2020-11-30 NOTE — Assessment & Plan Note (Signed)
No further symptoms. Continue aspirin. Will see if she is willing to retry pravastatin for secondary stroke prevention. See mychart message.

## 2020-11-30 NOTE — Patient Instructions (Signed)
Please complete lab work prior to leaving.   

## 2020-11-30 NOTE — Assessment & Plan Note (Signed)
New. Etiology not clear at this time. Will begin evaluation with labs as below.

## 2020-11-30 NOTE — Telephone Encounter (Signed)
See mychart.  

## 2020-11-30 NOTE — Telephone Encounter (Signed)
See my chart message

## 2020-11-30 NOTE — Progress Notes (Signed)
Subjective:   By signing my name below, I, Ariel [name], attest that this documentation has been prepared under the direction and in the presence of [name], NP, 11/30/2020    Patient ID: Ariel [name], female    DOB: 1955/03/28, 65 y.o.   MRN: [id number]  Chief Complaint  Patient presents with   Night Sweats   Weakness    HPI Patient is in today for an office visit.   TIA: She experienced a TIA episode and was treated for this using Plavix. She notes having had an allergic reaction that included breaking out in a rash and hives as well as lip swelling. She is no longer taking plavix. Night sweats: Over the last month she has experienced severe night sweats that will wake her up 2-3 times a night. They are so intense that she has to get up to change her night gown and her sheets 2-3 times a night. She also mentions that with her night sweats she has experienced an elevated heart rate. An associated symptom that accompanies the sweating is also a mild fever causing her to feel very hot. She reports a fever one night of 103 which accompanied her night sweats.  Vision change: She makes note of vision changes after her TIA that is affecting her vision in both eyes. She plans to follow up with her eye doctor. Low iron: She mentions having a history and current issue with low iron levels.     Health Maintenance Due  Topic Date Due   COLONOSCOPY (Pts 45-31yrs Insurance coverage will need to be confirmed)  03/02/2018   COVID-19 Vaccine (2 - [facility] risk series) 06/27/2019   INFLUENZA VACCINE  09/27/2020    Past Medical History:  Diagnosis Date   Chicken pox    Complication of anesthesia    Per patient, very slow to wake up after anesthesia   High cholesterol    Hyperglycemia 11/10/2020   Melanoma ([place]) 2021   right hip   PONV (postoperative nausea and vomiting)    Urinary incontinence     Past Surgical History:  Procedure Laterality Date   APPENDECTOMY  1962    EXCISION MELANOMA WITH SENTINEL LYMPH NODE BIOPSY Right 10/09/2019   Procedure: WIDE LOCAL EXCISION WITH ADVANCEMENT FLAP CLOSURE RIGHT HIP MELANOMA WITH SENTINEL LYMPH NODE BIOPSY;  Surgeon: Stark Klein, MD;  Location: [facility];  Service: General;  Laterality: Right;   HERNIA REPAIR  2009   MOUTH SURGERY  11/2015   gum surgery and bone implant   WISDOM TOOTH EXTRACTION      Family History  Problem Relation Age of Onset   Coronary artery disease Father        died at 50   COPD Father    Alcohol abuse Father    Diabetes Mellitus II Father    Coronary artery disease Sister    Cancer Sister        breast   Heart attack Brother    Hyperlipidemia Other        died 33- "natural causes"   Hyperlipidemia Other        4 siblings    Hypertension Other        3 siblings    Social History   Socioeconomic History   Marital status: Widowed    Spouse name: Not on file   Number of children: Not on file   Years of education: Not on file   Highest education level: Not on file  Occupational History  Occupation: retired  Tobacco Use   Smoking status: Never   Smokeless tobacco: Never  Vaping Use   Vaping Use: Never used  Substance and Sexual Activity   Alcohol use: Never   Drug use: Never   Sexual activity: Not Currently  Other Topics Concern   Not on file  Social History Narrative   Lives with husband   One Son- lives locally, 1 grand-daughter   Daughter- Sport and exercise psychologist. [place]   Completed Hotel manager school   Works as a Probation officer.         Social Determinants of Radio broadcast assistant Strain: Low Risk    Difficulty of Paying Living Expenses: Not hard at all  Food Insecurity: No Food Insecurity   Worried About Charity fundraiser in the Last Year: Never true   Arboriculturist in the Last Year: Never true  Transportation Needs: No Transportation Needs   Lack of Transportation (Medical): No   Lack of Transportation (Non-Medical): No  Physical Activity: Sufficiently Active    Days of Exercise per Week: 4 days   Minutes of Exercise per Session: 40 min  Stress: No Stress Concern Present   Feeling of Stress : Not at all  Social Connections: Moderately Isolated   Frequency of Communication with Friends and Family: More than three times a week   Frequency of Social Gatherings with Friends and Family: More than three times a week   Attends Religious Services: More than 4 times per year   Active Member of [employer] or Organizations: No   Attends Archivist Meetings: Never   Marital Status: Widowed  Human resources officer Violence: Not At Risk   Fear of Current or Ex-Partner: No   Emotionally Abused: No   Physically Abused: No   Sexually Abused: No    Outpatient Medications Prior to Visit  Medication Sig Dispense Refill   aspirin EC 81 MG tablet Take 81 mg by mouth daily. Swallow whole.     betamethasone dipropionate 0.05 % cream Apply topically 2 (two) times daily. (Patient not taking: Reported on 11/30/2020) 30 g 1   Ferrous Sulfate (IRON) 325 (65 Fe) MG TABS Take 325 mg by mouth daily. (Patient not taking: Reported on 11/30/2020)     acetaminophen (TYLENOL) 500 MG tablet Take 500-1,000 mg by mouth every 6 (six) hours as needed (for pain). (Patient not taking: Reported on 11/30/2020)     ascorbic acid (VITAMIN C) 500 MG tablet Take 500 mg by mouth daily. (Patient not taking: Reported on 11/30/2020)     Glycerin-Hypromellose-PEG 400 (VISINE DRY EYE OP) Place 1 drop into both eyes daily. (Patient not taking: Reported on 11/30/2020)     No facility-administered medications prior to visit.    Allergies  Allergen Reactions   Codeine Nausea Only   Plavix [Clopidogrel]     Hives,lip swelling     Review of Systems  Constitutional:  Positive for diaphoresis (at night) and fever.  Cardiovascular:        (+) felt tachycardic during night sweat episodes  Gastrointestinal:  Heartburn: at night associated with night sweats.      Objective:    Physical  Exam Constitutional:      General: She is not in acute distress.    Appearance: Normal appearance. She is not ill-appearing.  HENT:     Head: Normocephalic and atraumatic.     Right Ear: External ear normal.     Left Ear: External ear normal.  Eyes:  Extraocular Movements: Extraocular movements intact.     Pupils: Pupils are equal, round, and reactive to light.  Cardiovascular:     Rate and Rhythm: Normal rate and regular rhythm.     Heart sounds: Normal heart sounds. No murmur heard.   No gallop.  Pulmonary:     Effort: Pulmonary effort is normal. No respiratory distress.     Breath sounds: Normal breath sounds. No wheezing or rales.  Abdominal:     Palpations: Abdomen is soft.     Tenderness: There is no abdominal tenderness.  Lymphadenopathy:     Cervical: No cervical adenopathy.  Skin:    General: Skin is warm and dry.  Neurological:     Mental Status: She is alert and oriented to person, place, and time.  Psychiatric:        Behavior: Behavior normal.        Judgment: Judgment normal.    BP 117/61 (BP Location: Right Arm, Cuff Size: Normal)   Pulse 70   Temp 98.1 F (36.7 C) (Oral)   Resp 12   Ht 5\' 6"  (1.676 m)   Wt 143 lb 9.6 oz (65.1 kg)   SpO2 100%   BMI 23.18 kg/m  Wt Readings from Last 3 Encounters:  11/30/20 143 lb 9.6 oz (65.1 kg)  11/23/20 140 lb 1.9 oz (63.6 kg)  11/10/20 142 lb (64.4 kg)       Assessment & Plan:   Problem List Items Addressed This Visit       Unprioritized   TIA (transient ischemic attack)    No further symptoms. Continue aspirin. Will see if she is willing to retry pravastatin for secondary stroke prevention. See mychart message.      Night sweat - Primary    New. Etiology not clear at this time. Will begin evaluation with labs as below.        Relevant Orders   QuantiFERON-TB Gold Plus   CBC with Differential/Platelet   HIV antibody (with reflex)   C-reactive protein   Hepatic function panel   Culture, blood  (single)   Culture, blood (single)   Urinalysis, Routine w reflex microscopic   B. burgdorfi antibodies   Rocky mtn spotted fvr abs pnl(IgG+IgM)   Other Visit Diagnoses     Fever, unspecified fever cause       Relevant Orders   B. burgdorfi antibodies   Rocky mtn spotted fvr abs pnl(IgG+IgM)      No orders of the defined types were placed in this encounter.   I, [name], NP, personally preformed the services described in this documentation.  All medical record entries made by the scribe were at my direction and in my presence.  I have reviewed the chart and discharge instructions (if applicable) and agree that the record reflects my personal performance and is accurate and complete. 11/30/2020  I,Ariel [name],acting as a Education administrator for Nance Pear, NP.,have documented all relevant documentation on the behalf of Nance Pear, NP,as directed by  Nance Pear, NP while in the presence of Nance Pear, NP.  Nance Pear, NP

## 2020-11-30 NOTE — Telephone Encounter (Signed)
Patient was seen in office today.  

## 2020-12-01 ENCOUNTER — Telehealth: Payer: Self-pay | Admitting: *Deleted

## 2020-12-01 ENCOUNTER — Inpatient Hospital Stay: Payer: Medicare Other | Admitting: Family

## 2020-12-01 ENCOUNTER — Inpatient Hospital Stay (HOSPITAL_BASED_OUTPATIENT_CLINIC_OR_DEPARTMENT_OTHER): Payer: Medicare Other | Admitting: Hematology & Oncology

## 2020-12-01 ENCOUNTER — Inpatient Hospital Stay: Payer: Medicare Other

## 2020-12-01 ENCOUNTER — Telehealth: Payer: Self-pay | Admitting: Family

## 2020-12-01 ENCOUNTER — Inpatient Hospital Stay: Payer: Medicare Other | Attending: Hematology & Oncology

## 2020-12-01 ENCOUNTER — Other Ambulatory Visit: Payer: Self-pay | Admitting: Family

## 2020-12-01 ENCOUNTER — Other Ambulatory Visit: Payer: Medicare Other

## 2020-12-01 ENCOUNTER — Encounter: Payer: Self-pay | Admitting: Hematology & Oncology

## 2020-12-01 ENCOUNTER — Ambulatory Visit: Payer: Medicare Other

## 2020-12-01 VITALS — BP 130/67 | HR 87 | Temp 98.8°F | Resp 18 | Wt 143.0 lb

## 2020-12-01 DIAGNOSIS — D649 Anemia, unspecified: Secondary | ICD-10-CM

## 2020-12-01 DIAGNOSIS — D500 Iron deficiency anemia secondary to blood loss (chronic): Secondary | ICD-10-CM | POA: Diagnosis not present

## 2020-12-01 DIAGNOSIS — Z85820 Personal history of malignant melanoma of skin: Secondary | ICD-10-CM | POA: Diagnosis not present

## 2020-12-01 DIAGNOSIS — C4359 Malignant melanoma of other part of trunk: Secondary | ICD-10-CM

## 2020-12-01 DIAGNOSIS — D509 Iron deficiency anemia, unspecified: Secondary | ICD-10-CM | POA: Insufficient documentation

## 2020-12-01 DIAGNOSIS — Z794 Long term (current) use of insulin: Secondary | ICD-10-CM | POA: Insufficient documentation

## 2020-12-01 LAB — CBC WITH DIFFERENTIAL (CANCER CENTER ONLY)
Abs Immature Granulocytes: 0.09 10*3/uL — ABNORMAL HIGH (ref 0.00–0.07)
Basophils Absolute: 0 10*3/uL (ref 0.0–0.1)
Basophils Relative: 1 %
Eosinophils Absolute: 0.3 10*3/uL (ref 0.0–0.5)
Eosinophils Relative: 4 %
HCT: 29.6 % — ABNORMAL LOW (ref 36.0–46.0)
Hemoglobin: 9 g/dL — ABNORMAL LOW (ref 12.0–15.0)
Immature Granulocytes: 1 %
Lymphocytes Relative: 21 %
Lymphs Abs: 1.5 10*3/uL (ref 0.7–4.0)
MCH: 23.2 pg — ABNORMAL LOW (ref 26.0–34.0)
MCHC: 30.4 g/dL (ref 30.0–36.0)
MCV: 76.3 fL — ABNORMAL LOW (ref 80.0–100.0)
Monocytes Absolute: 0.7 10*3/uL (ref 0.1–1.0)
Monocytes Relative: 10 %
Neutro Abs: 4.6 10*3/uL (ref 1.7–7.7)
Neutrophils Relative %: 63 %
Platelet Count: 427 10*3/uL — ABNORMAL HIGH (ref 150–400)
RBC: 3.88 MIL/uL (ref 3.87–5.11)
RDW: 17.1 % — ABNORMAL HIGH (ref 11.5–15.5)
WBC Count: 7.1 10*3/uL (ref 4.0–10.5)
nRBC: 0 % (ref 0.0–0.2)

## 2020-12-01 LAB — CMP (CANCER CENTER ONLY)
ALT: 37 U/L (ref 0–44)
AST: 30 U/L (ref 15–41)
Albumin: 3.5 g/dL (ref 3.5–5.0)
Alkaline Phosphatase: 84 U/L (ref 38–126)
Anion gap: 8 (ref 5–15)
BUN: 18 mg/dL (ref 8–23)
CO2: 30 mmol/L (ref 22–32)
Calcium: 9.8 mg/dL (ref 8.9–10.3)
Chloride: 95 mmol/L — ABNORMAL LOW (ref 98–111)
Creatinine: 0.59 mg/dL (ref 0.44–1.00)
GFR, Estimated: >60 mL/min (ref >60)
Glucose, Bld: 97 mg/dL (ref 70–99)
Potassium: 4.2 mmol/L (ref 3.5–5.1)
Sodium: 133 mmol/L — ABNORMAL LOW (ref 135–145)
Total Bilirubin: 0.3 mg/dL (ref 0.3–1.2)
Total Protein: 8 g/dL (ref 6.5–8.1)

## 2020-12-01 LAB — ROCKY MTN SPOTTED FVR ABS PNL(IGG+IGM)
RMSF IgG: NOT DETECTED
RMSF IgM: NOT DETECTED

## 2020-12-01 LAB — B. BURGDORFI ANTIBODIES: B burgdorferi Ab IgG+IgM: <0.9 index

## 2020-12-01 LAB — RETICULOCYTES
Immature Retic Fract: 16.1 % — ABNORMAL HIGH (ref 2.3–15.9)
RBC.: 3.89 MIL/uL (ref 3.87–5.11)
Retic Count, Absolute: 59.1 10*3/uL (ref 19.0–186.0)
Retic Ct Pct: 1.5 % (ref 0.4–3.1)

## 2020-12-01 LAB — SAMPLE TO BLOOD BANK

## 2020-12-01 LAB — HIV ANTIBODY (ROUTINE TESTING W REFLEX): HIV 1&2 Ab, 4th Generation: NONREACTIVE

## 2020-12-01 LAB — LACTATE DEHYDROGENASE: LDH: 161 U/L (ref 98–192)

## 2020-12-01 LAB — SAVE SMEAR(SSMR), FOR PROVIDER SLIDE REVIEW

## 2020-12-01 NOTE — Telephone Encounter (Signed)
Per referral Dr. Inda Castle called and gave upcoming appointments - confirmed

## 2020-12-01 NOTE — Progress Notes (Signed)
Referral MD  Reason for Referral: Microcytic anemia-likely iron deficiency  Chief Complaint  Patient presents with   New Patient (Initial Visit)  : I am more anemic.  HPI: Ariel [name] is a very charming 65 year old white female.  We actually took care of her husband who passed away I think a few years ago.  She actually has a diagnosis of stage I melanoma.  This was removed from the right hip.  This was back in 2021.  She did not require any adjuvant therapy.  She is followed [name].  She has been having issues with anemia.  She been on oral iron.  She may have had IV iron once or twice.  Back in September, her white cell count was 9.2.  Hemoglobin 0.1.  Platelet count 551,000.  MCV was 74.  Yesterday, her white cell count was 5.9.  Hemoglobin 8.6.  Platelet count 415,000.  MCV was 74.  Her last colonoscopy was about 10 years ago.  We did do a rectal exam on her today.  The stool was brown and heme negative.  She is on baby aspirin.  She apparently had a TIA.  She was on Plavix but had a lot of bruising from Plavix.  She subsequently stopped this.  The iron studies that we have on her from a month ago shows a ferritin of 145 with an iron saturation of 17%.  She has not noted any obvious bleeding.  There is been no issues with her appetite.  She is not  vegetarian..  She does not smoke.  She rarely has alcoholic beverages.  She said that she has been anemic her whole life.  She said her mother was iron deficient.  She has had no weight loss or weight gain.  She has had no other surgeries.  She has had no mouth sores.  Overall, I would say that her performance status is ECOG 0.   Past Medical History:  Diagnosis Date   Chicken pox    Complication of anesthesia    Per patient, very slow to wake up after anesthesia   High cholesterol    Hyperglycemia 11/10/2020   Melanoma ([place]) 2021   right hip   PONV (postoperative nausea and vomiting)    Urinary incontinence    :   Past Surgical History:  Procedure Laterality Date   [place] LYMPH NODE BIOPSY Right 10/09/2019   Procedure: WIDE LOCAL EXCISION WITH ADVANCEMENT FLAP CLOSURE RIGHT HIP MELANOMA WITH SENTINEL LYMPH NODE BIOPSY;  Surgeon: Stark Klein, MD;  Location: [facility];  Service: General;  Laterality: Right;   HERNIA REPAIR  2009   MOUTH SURGERY  11/2015   gum surgery and bone implant   WISDOM TOOTH EXTRACTION    :   Current Outpatient Medications:    aspirin EC 81 MG tablet, Take 81 mg by mouth daily. Swallow whole., Disp: , Rfl:    betamethasone dipropionate 0.05 % cream, Apply topically 2 (two) times daily. (Patient not taking: Reported on 11/30/2020), Disp: 30 g, Rfl: 1   Ferrous Sulfate (IRON) 325 (65 Fe) MG TABS, Take 325 mg by mouth daily. (Patient not taking: Reported on 11/30/2020), Disp: , Rfl: :  :   Allergies  Allergen Reactions   Codeine Nausea Only   Plavix [Clopidogrel]     Hives,lip swelling   :   Family History  Problem Relation Age of Onset   Coronary artery disease Father  died at 70   COPD Father    Alcohol abuse Father    Diabetes Mellitus II Father    Coronary artery disease Sister    Cancer Sister        breast   Heart attack Brother    Hyperlipidemia Other        died 52- "natural causes"   Hyperlipidemia Other        4 siblings    Hypertension Other        3 siblings  :   Social History   Socioeconomic History   Marital status: Widowed    Spouse name: Not on file   Number of children: Not on file   Years of education: Not on file   Highest education level: Not on file  Occupational History   Occupation: retired  Tobacco Use   Smoking status: Never   Smokeless tobacco: Never  Vaping Use   Vaping Use: Never used  Substance and Sexual Activity   Alcohol use: Never   Drug use: Never   Sexual activity: Not Currently  Other Topics Concern   Not on file  Social History Narrative   Lives  with husband   One Son- lives locally, 1 grand-daughter   Daughter- Sport and exercise psychologist. [place]   Completed Hotel manager school   Works as a Probation officer.         Social Determinants of Radio broadcast assistant Strain: Low Risk    Difficulty of Paying Living Expenses: Not hard at all  Food Insecurity: No Food Insecurity   Worried About Charity fundraiser in the Last Year: Never true   Arboriculturist in the Last Year: Never true  Transportation Needs: No Transportation Needs   Lack of Transportation (Medical): No   Lack of Transportation (Non-Medical): No  Physical Activity: Sufficiently Active   Days of Exercise per Week: 4 days   Minutes of Exercise per Session: 40 min  Stress: No Stress Concern Present   Feeling of Stress : Not at all  Social Connections: Moderately Isolated   Frequency of Communication with Friends and Family: More than three times a week   Frequency of Social Gatherings with Friends and Family: More than three times a week   Attends Religious Services: More than 4 times per year   Active Member of [employer] or Organizations: No   Attends Archivist Meetings: Never   Marital Status: Widowed  Human resources officer Violence: Not At Risk   Fear of Current or Ex-Partner: No   Emotionally Abused: No   Physically Abused: No   Sexually Abused: No  :  Review of Systems  Constitutional: Negative.   HENT: Negative.    Eyes: Negative.   Respiratory: Negative.    Cardiovascular: Negative.   Gastrointestinal: Negative.   Genitourinary: Negative.   Musculoskeletal: Negative.   Skin: Negative.   Neurological: Negative.   Endo/Heme/Allergies: Negative.   Psychiatric/Behavioral: Negative.      Exam: _0 @ This is a well-developed well-nourished white female in no obvious distress.  Vital signs show temperature of 98.8.  Pulse 87.  Blood pressure 130/67.  Weight is 143 pounds.  Head and neck exam shows no ocular or oral lesions.  She has no scleral icterus.   There is no adenopathy in the neck.  Thyroid is nonpalpable.  Lungs are clear bilaterally.  Cardiac exam regular rate and rhythm with normal S1 and S2.  She has no murmurs, rubs or bruits.  Abdomen is  soft.  She has good bowel sounds.  She has the wide local excision scar in the right hip from her melanoma surgery.  There is no fluid wave there is no palpable liver or spleen tip.  Back exam shows no tenderness over the spine, ribs or hips.  Extremities shows no clubbing, cyanosis or edema.  Neurological exam shows no focal neurological deficits.  Skin exam shows no rashes, ecchymoses or petechiae.   Recent Labs    11/30/20 1218 12/01/20 1537  WBC 5.9 7.1  HGB 8.6 Repeated and verified X2.* 9.0*  HCT 26.8* 29.6*  PLT 415.0* 427*    Recent Labs    12/01/20 1537  NA 133*  K 4.2  CL 95*  CO2 30  GLUCOSE 97  BUN 18  CREATININE 0.59  CALCIUM 9.8    Blood smear review: Normochromic and normocytic population of red blood cells.  I see no nucleated red blood cells.  There are no teardrop cells.  She has no schistocytes.  There are no target cells.  I see no rouleaux formation.  White blood cells appear normal in morphology and maturation.  She has no hypersegmented polys.  There is no immature myeloid or lymphoid forms.  Platelets are adequate number and size.  Pathology: None    Assessment and Plan: Ariel [name] is a very charming 65 year old white female.  She has microcytic anemia.  I am not sure why her hemoglobin would drop so much in 1 month.  We did check a rectal exam.  The stool was heme-negative.  I noted that her reticulocyte count, when corrected is probably less than 1%.  I just wonder if her erythropoietin level is going be okay.  I have to believe that her iron is going to be low.  We will see what her iron studies look like.  I am sure that IV iron will help.  The question is whether or not we will need to give any ESA if her erythropoietin level is inappropriately  low.  I do not see that she needs to have a bone marrow biopsy done.  I see nothing on her blood smear or on her exam that would suggest a hematologic malignancy.  She does have the history of melanoma.  This is a stage I melanoma.  I cannot imagine that this is going to be a problem.  Again, I do not see that we have to do any radiographic studies on her for this.  For right now, we will see what the iron studies look like.  We will then plan to get her back in and see how we can address the abnormalities and get her blood count where it needs to be.  It was a pleasure talking to her.  She is very strong in her faith.  I gave her a prayer blanket which she very much appreciated.

## 2020-12-01 NOTE — Telephone Encounter (Signed)
Pt states she is taking the iron supplement and will retry pravastatin. In the meantime, I have placed a referral to hematology and GI due to progressive anemia. She will return in 1 week to repeat cbc in my office.  She is advised to go to the ER if increased SOB/weakness. Pt verbalizes understanding.

## 2020-12-02 ENCOUNTER — Encounter: Payer: Self-pay | Admitting: Hematology & Oncology

## 2020-12-02 LAB — IRON AND TIBC
Iron: 15 ug/dL — ABNORMAL LOW (ref 41–142)
Saturation Ratios: 7 % — ABNORMAL LOW (ref 21–57)
TIBC: 209 ug/dL — ABNORMAL LOW (ref 236–444)
UIBC: 194 ug/dL (ref 120–384)

## 2020-12-02 LAB — ERYTHROPOIETIN: Erythropoietin: 52.6 m[IU]/mL — ABNORMAL HIGH (ref 2.6–18.5)

## 2020-12-02 LAB — FERRITIN: Ferritin: 309 ng/mL — ABNORMAL HIGH (ref 11–307)

## 2020-12-02 NOTE — Telephone Encounter (Signed)
Spoke with Dr Marin Olp who has not received the lab results yet due to [facility] ordering them and receiving them back. Printed final results for Dr E to review and he stated that pts iron and saturation level are low so she needs IV Iron. Pt advised and stated understanding. Will seen message to scheduling to call for apt.

## 2020-12-02 NOTE — Addendum Note (Signed)
Addended by: Burney Gauze R on: 12/02/2020 04:44 PM   Modules accepted: Orders

## 2020-12-03 ENCOUNTER — Telehealth: Payer: Self-pay | Admitting: *Deleted

## 2020-12-03 LAB — QUANTIFERON-TB GOLD PLUS
Mitogen-NIL: 4.61 IU/mL
NIL: 0.06 IU/mL
QuantiFERON-TB Gold Plus: NEGATIVE
TB1-NIL: 0 IU/mL
TB2-NIL: <0 IU/mL

## 2020-12-03 NOTE — Telephone Encounter (Signed)
Per staff message Alyson Ingles called and gave upcoming appointments - (1) dose of IV Iron

## 2020-12-05 ENCOUNTER — Encounter: Payer: Self-pay | Admitting: Family

## 2020-12-05 LAB — CULTURE, BLOOD (SINGLE)
MICRO NUMBER:: 12457960
MICRO NUMBER:: 12457964
Result:: NO GROWTH
Result:: NO GROWTH
SPECIMEN QUALITY:: ADEQUATE
SPECIMEN QUALITY:: ADEQUATE

## 2020-12-06 ENCOUNTER — Ambulatory Visit: Payer: Medicare Other

## 2020-12-06 DIAGNOSIS — H53131 Sudden visual loss, right eye: Secondary | ICD-10-CM | POA: Diagnosis not present

## 2020-12-06 DIAGNOSIS — H35721 Serous detachment of retinal pigment epithelium, right eye: Secondary | ICD-10-CM | POA: Diagnosis not present

## 2020-12-06 DIAGNOSIS — H43391 Other vitreous opacities, right eye: Secondary | ICD-10-CM | POA: Diagnosis not present

## 2020-12-06 DIAGNOSIS — H4311 Vitreous hemorrhage, right eye: Secondary | ICD-10-CM | POA: Diagnosis not present

## 2020-12-06 DIAGNOSIS — H35361 Drusen (degenerative) of macula, right eye: Secondary | ICD-10-CM | POA: Diagnosis not present

## 2020-12-06 DIAGNOSIS — H4313 Vitreous hemorrhage, bilateral: Secondary | ICD-10-CM | POA: Diagnosis not present

## 2020-12-06 DIAGNOSIS — H35451 Secondary pigmentary degeneration, right eye: Secondary | ICD-10-CM | POA: Diagnosis not present

## 2020-12-07 ENCOUNTER — Encounter (HOSPITAL_COMMUNITY): Admission: RE | Disposition: A | Discharge status: Home / Self Care | Payer: Self-pay | Attending: Ophthalmology

## 2020-12-07 ENCOUNTER — Ambulatory Visit (HOSPITAL_COMMUNITY): Payer: Medicare Other | Admitting: Anesthesiology

## 2020-12-07 ENCOUNTER — Other Ambulatory Visit: Payer: Self-pay

## 2020-12-07 ENCOUNTER — Ambulatory Visit (HOSPITAL_COMMUNITY)
Admission: RE | Admit: 2020-12-07 | Discharge: 2020-12-07 | Disposition: A | Discharge status: Home / Self Care | Payer: Medicare Other | Attending: Ophthalmology | Admitting: Ophthalmology

## 2020-12-07 ENCOUNTER — Encounter (HOSPITAL_COMMUNITY): Payer: Self-pay | Admitting: Ophthalmology

## 2020-12-07 ENCOUNTER — Ambulatory Visit: Payer: Medicare Other

## 2020-12-07 DIAGNOSIS — E0590 Thyrotoxicosis, unspecified without thyrotoxic crisis or storm: Secondary | ICD-10-CM | POA: Diagnosis not present

## 2020-12-07 DIAGNOSIS — Z808 Family history of malignant neoplasm of other organs or systems: Secondary | ICD-10-CM | POA: Diagnosis not present

## 2020-12-07 DIAGNOSIS — Z8673 Personal history of transient ischemic attack (TIA), and cerebral infarction without residual deficits: Secondary | ICD-10-CM | POA: Diagnosis not present

## 2020-12-07 DIAGNOSIS — C7931 Secondary malignant neoplasm of brain: Secondary | ICD-10-CM | POA: Diagnosis not present

## 2020-12-07 DIAGNOSIS — Z79899 Other long term (current) drug therapy: Secondary | ICD-10-CM | POA: Diagnosis not present

## 2020-12-07 DIAGNOSIS — Z7982 Long term (current) use of aspirin: Secondary | ICD-10-CM | POA: Diagnosis not present

## 2020-12-07 DIAGNOSIS — H4311 Vitreous hemorrhage, right eye: Secondary | ICD-10-CM | POA: Insufficient documentation

## 2020-12-07 DIAGNOSIS — E871 Hypo-osmolality and hyponatremia: Secondary | ICD-10-CM | POA: Diagnosis not present

## 2020-12-07 DIAGNOSIS — E278 Other specified disorders of adrenal gland: Secondary | ICD-10-CM | POA: Diagnosis not present

## 2020-12-07 DIAGNOSIS — I69320 Aphasia following cerebral infarction: Secondary | ICD-10-CM | POA: Diagnosis not present

## 2020-12-07 DIAGNOSIS — Z20822 Contact with and (suspected) exposure to covid-19: Secondary | ICD-10-CM | POA: Diagnosis not present

## 2020-12-07 DIAGNOSIS — G9341 Metabolic encephalopathy: Secondary | ICD-10-CM | POA: Diagnosis not present

## 2020-12-07 DIAGNOSIS — I10 Essential (primary) hypertension: Secondary | ICD-10-CM | POA: Diagnosis not present

## 2020-12-07 DIAGNOSIS — G936 Cerebral edema: Secondary | ICD-10-CM | POA: Diagnosis not present

## 2020-12-07 DIAGNOSIS — D509 Iron deficiency anemia, unspecified: Secondary | ICD-10-CM | POA: Diagnosis not present

## 2020-12-07 DIAGNOSIS — R590 Localized enlarged lymph nodes: Secondary | ICD-10-CM | POA: Diagnosis not present

## 2020-12-07 DIAGNOSIS — Z8249 Family history of ischemic heart disease and other diseases of the circulatory system: Secondary | ICD-10-CM | POA: Insufficient documentation

## 2020-12-07 DIAGNOSIS — H40051 Ocular hypertension, right eye: Secondary | ICD-10-CM | POA: Insufficient documentation

## 2020-12-07 DIAGNOSIS — H1131 Conjunctival hemorrhage, right eye: Secondary | ICD-10-CM | POA: Diagnosis not present

## 2020-12-07 DIAGNOSIS — Z85820 Personal history of malignant melanoma of skin: Secondary | ICD-10-CM | POA: Diagnosis not present

## 2020-12-07 DIAGNOSIS — H3561 Retinal hemorrhage, right eye: Secondary | ICD-10-CM | POA: Diagnosis not present

## 2020-12-07 DIAGNOSIS — R29710 NIHSS score 10: Secondary | ICD-10-CM | POA: Diagnosis not present

## 2020-12-07 DIAGNOSIS — Z833 Family history of diabetes mellitus: Secondary | ICD-10-CM | POA: Diagnosis not present

## 2020-12-07 DIAGNOSIS — R4701 Aphasia: Secondary | ICD-10-CM | POA: Diagnosis not present

## 2020-12-07 DIAGNOSIS — E7800 Pure hypercholesterolemia, unspecified: Secondary | ICD-10-CM | POA: Diagnosis not present

## 2020-12-07 HISTORY — PX: PARS PLANA VITRECTOMY: SHX2166

## 2020-12-07 HISTORY — PX: AIR/FLUID EXCHANGE: SHX6494

## 2020-12-07 HISTORY — PX: PHOTOCOAGULATION WITH LASER: SHX6027

## 2020-12-07 SURGERY — PARS PLANA VITRECTOMY WITH 25 GAUGE
Anesthesia: Monitor Anesthesia Care | Site: Eye | Laterality: Right

## 2020-12-07 MED ORDER — LIDOCAINE HCL 2 % IJ SOLN
INTRAMUSCULAR | Status: DC | PRN
  Administered 2020-12-07: 6 mL via RETROBULBAR

## 2020-12-07 MED ORDER — ONDANSETRON HCL 4 MG/2ML IJ SOLN: 4.0000 mg | Freq: Once | INTRAMUSCULAR | Status: DC | PRN

## 2020-12-07 MED ORDER — LIDOCAINE HCL 2 % IJ SOLN
INTRAMUSCULAR | Status: AC
  Filled 2020-12-07: qty 20

## 2020-12-07 MED ORDER — OFLOXACIN 0.3 % OP SOLN
1.0000 [drp] | OPHTHALMIC | Status: AC | PRN
  Administered 2020-12-07: 1 [drp] via OPHTHALMIC

## 2020-12-07 MED ORDER — TROPICAMIDE 1 % OP SOLN
1.0000 [drp] | OPHTHALMIC | Status: AC | PRN
  Administered 2020-12-07 (×2): 1 [drp] via OPHTHALMIC

## 2020-12-07 MED ORDER — MIDAZOLAM HCL 5 MG/5ML IJ SOLN
INTRAMUSCULAR | Status: DC | PRN
  Administered 2020-12-07: 1 mg via INTRAVENOUS

## 2020-12-07 MED ORDER — PROPOFOL 10 MG/ML IV BOLUS
INTRAVENOUS | Status: DC | PRN
  Administered 2020-12-07: 80 mg via INTRAVENOUS

## 2020-12-07 MED ORDER — SODIUM CHLORIDE 0.9 % IV SOLN: INTRAVENOUS | Status: DC

## 2020-12-07 MED ORDER — PROPARACAINE HCL 0.5 % OP SOLN
1.0000 [drp] | OPHTHALMIC | Status: AC | PRN
  Administered 2020-12-07 (×2): 1 [drp] via OPHTHALMIC

## 2020-12-07 MED ORDER — MIDAZOLAM HCL 2 MG/2ML IJ SOLN
INTRAMUSCULAR | Status: AC
  Filled 2020-12-07: qty 2

## 2020-12-07 MED ORDER — ONDANSETRON HCL 4 MG/2ML IJ SOLN
INTRAMUSCULAR | Status: DC | PRN
  Administered 2020-12-07: 4 mg via INTRAVENOUS

## 2020-12-07 MED ORDER — PHENYLEPHRINE HCL 2.5 % OP SOLN
OPHTHALMIC | Status: AC
  Administered 2020-12-07: 1 [drp] via OPHTHALMIC
  Filled 2020-12-07: qty 2

## 2020-12-07 MED ORDER — PROPOFOL 10 MG/ML IV BOLUS
INTRAVENOUS | Status: AC
  Filled 2020-12-07: qty 20

## 2020-12-07 MED ORDER — CHLORHEXIDINE GLUCONATE 0.12 % MT SOLN
15.0000 mL | OROMUCOSAL | Status: AC
  Filled 2020-12-07: qty 15

## 2020-12-07 MED ORDER — TOBRAMYCIN-DEXAMETHASONE 0.3-0.1 % OP OINT
TOPICAL_OINTMENT | OPHTHALMIC | Status: AC
  Filled 2020-12-07: qty 3.5

## 2020-12-07 MED ORDER — DEXAMETHASONE SODIUM PHOSPHATE 10 MG/ML IJ SOLN
INTRAMUSCULAR | Status: DC | PRN
  Administered 2020-12-07: 10 mg

## 2020-12-07 MED ORDER — TROPICAMIDE 1 % OP SOLN
OPHTHALMIC | Status: AC
  Administered 2020-12-07: 1 [drp] via OPHTHALMIC
  Filled 2020-12-07: qty 15

## 2020-12-07 MED ORDER — CYCLOPENTOLATE HCL 1 % OP SOLN
1.0000 [drp] | OPHTHALMIC | Status: AC | PRN
  Administered 2020-12-07 (×2): 1 [drp] via OPHTHALMIC

## 2020-12-07 MED ORDER — NA CHONDROIT SULF-NA HYALURON 40-30 MG/ML IO SOSY
INTRAOCULAR | Status: AC
  Filled 2020-12-07: qty 0.5

## 2020-12-07 MED ORDER — EPINEPHRINE PF 1 MG/ML IJ SOLN
INTRAOCULAR | Status: DC | PRN
  Administered 2020-12-07: 500 mL

## 2020-12-07 MED ORDER — BSS PLUS IO SOLN
INTRAOCULAR | Status: AC
  Filled 2020-12-07: qty 500

## 2020-12-07 MED ORDER — CEFAZOLIN SUBCONJUNCTIVAL INJECTION 100 MG/0.5 ML
INJECTION | SUBCONJUNCTIVAL | Status: DC | PRN
  Administered 2020-12-07: 100 mg via SUBCONJUNCTIVAL

## 2020-12-07 MED ORDER — ATROPINE SULFATE 1 % OP SOLN
OPHTHALMIC | Status: AC
  Filled 2020-12-07: qty 5

## 2020-12-07 MED ORDER — OFLOXACIN 0.3 % OP SOLN
OPHTHALMIC | Status: AC
  Administered 2020-12-07: 1 [drp] via OPHTHALMIC
  Filled 2020-12-07: qty 5

## 2020-12-07 MED ORDER — BUPIVACAINE HCL (PF) 0.75 % IJ SOLN
INTRAMUSCULAR | Status: AC
  Filled 2020-12-07: qty 10

## 2020-12-07 MED ORDER — FENTANYL CITRATE (PF) 100 MCG/2ML IJ SOLN: 25.0000 ug | INTRAMUSCULAR | Status: DC | PRN

## 2020-12-07 MED ORDER — TOBRAMYCIN-DEXAMETHASONE 0.3-0.1 % OP OINT
TOPICAL_OINTMENT | OPHTHALMIC | Status: DC | PRN
  Administered 2020-12-07: 1

## 2020-12-07 MED ORDER — CYCLOPENTOLATE HCL 1 % OP SOLN
OPHTHALMIC | Status: AC
  Administered 2020-12-07: 1 [drp] via OPHTHALMIC
  Filled 2020-12-07: qty 2

## 2020-12-07 MED ORDER — HYALURONIDASE HUMAN 150 UNIT/ML IJ SOLN
INTRAMUSCULAR | Status: AC
  Filled 2020-12-07: qty 1

## 2020-12-07 MED ORDER — CHLORHEXIDINE GLUCONATE 0.12 % MT SOLN
OROMUCOSAL | Status: AC
  Administered 2020-12-07: 15 mL via OROMUCOSAL
  Filled 2020-12-07: qty 15

## 2020-12-07 MED ORDER — PROPARACAINE HCL 0.5 % OP SOLN
OPHTHALMIC | Status: AC
  Administered 2020-12-07: 1 [drp] via OPHTHALMIC
  Filled 2020-12-07: qty 15

## 2020-12-07 MED ORDER — BSS IO SOLN
INTRAOCULAR | Status: AC
  Filled 2020-12-07: qty 15

## 2020-12-07 MED ORDER — PHENYLEPHRINE HCL 2.5 % OP SOLN
1.0000 [drp] | OPHTHALMIC | Status: AC | PRN
  Administered 2020-12-07 (×2): 1 [drp] via OPHTHALMIC

## 2020-12-07 MED ORDER — INDOCYANINE GREEN 25 MG IV SOLR
INTRAVENOUS | Status: AC
  Filled 2020-12-07: qty 10

## 2020-12-07 MED ORDER — EPINEPHRINE PF 1 MG/ML IJ SOLN
INTRAMUSCULAR | Status: AC
  Filled 2020-12-07: qty 1

## 2020-12-07 MED ORDER — ACETAMINOPHEN 10 MG/ML IV SOLN: 1000.0000 mg | Freq: Once | INTRAVENOUS | Status: DC | PRN

## 2020-12-07 MED ORDER — CEFAZOLIN SUBCONJUNCTIVAL INJECTION 100 MG/0.5 ML
100.0000 mg | INJECTION | SUBCONJUNCTIVAL | Status: DC
  Filled 2020-12-07: qty 5

## 2020-12-07 MED ORDER — NA CHONDROIT SULF-NA HYALURON 40-30 MG/ML IO SOSY
INTRAOCULAR | Status: DC | PRN
  Administered 2020-12-07: 0.75 mL via INTRAOCULAR

## 2020-12-07 MED ORDER — BSS IO SOLN
INTRAOCULAR | Status: DC | PRN
  Administered 2020-12-07: 15 mL via INTRAOCULAR

## 2020-12-07 MED ORDER — DEXAMETHASONE SODIUM PHOSPHATE 10 MG/ML IJ SOLN
INTRAMUSCULAR | Status: AC
  Filled 2020-12-07: qty 1

## 2020-12-07 SURGICAL SUPPLY — 57 items
APL SWBSTK 6 STRL LF DISP (MISCELLANEOUS) ×1
APPLICATOR COTTON TIP 6 STRL (MISCELLANEOUS) ×1 IMPLANT
APPLICATOR COTTON TIP 6IN STRL (MISCELLANEOUS) ×2 IMPLANT
BAND WRIST GAS GREEN (MISCELLANEOUS) IMPLANT
BLADE MVR KNIFE 20G (BLADE) IMPLANT
BLADE STAB KNIFE 15DEG (BLADE) IMPLANT
CANNULA ANT CHAM MAIN (OPHTHALMIC RELATED) IMPLANT
CANNULA DUAL BORE 23G (CANNULA) IMPLANT
CANNULA DUALBORE 25G (CANNULA) IMPLANT
CANNULA VLV SOFT TIP 25GA (OPHTHALMIC) ×2 IMPLANT
CAUTERY EYE LOW TEMP 1300F FIN (OPHTHALMIC RELATED) IMPLANT
CLSR STERI-STRIP ANTIMIC 1/2X4 (GAUZE/BANDAGES/DRESSINGS) ×2 IMPLANT
COVER MAYO STAND STRL (DRAPES) IMPLANT
DRAPE HALF SHEET 40X57 (DRAPES) ×2 IMPLANT
DRAPE INCISE 51X51 W/FILM STRL (DRAPES) IMPLANT
DRAPE RETRACTOR (MISCELLANEOUS) ×2 IMPLANT
FORCEPS ECKARDT ILM 25G SERR (OPHTHALMIC RELATED) IMPLANT
FORCEPS GRIESHABER ILM 25G A (INSTRUMENTS) IMPLANT
GAS AUTO FILL CONSTEL (OPHTHALMIC)
GAS AUTO FILL CONSTELLATION (OPHTHALMIC) IMPLANT
GAS WRIST BAND GREEN (MISCELLANEOUS)
GLOVE SURG SYN 7.5  E (GLOVE) ×1
GLOVE SURG SYN 7.5 E (GLOVE) ×1 IMPLANT
GOWN STRL REUS W/ TWL LRG LVL3 (GOWN DISPOSABLE) ×1 IMPLANT
GOWN STRL REUS W/TWL LRG LVL3 (GOWN DISPOSABLE) ×2
KIT BASIN OR (CUSTOM PROCEDURE TRAY) ×2 IMPLANT
KIT TURNOVER KIT B (KITS) ×2 IMPLANT
LENS BIOM SUPER VIEW SET DISP (MISCELLANEOUS) ×2 IMPLANT
MICROPICK 25G (MISCELLANEOUS)
NEEDLE 18GX1X1/2 (RX/OR ONLY) (NEEDLE) ×2 IMPLANT
NEEDLE 25GX 5/8IN NON SAFETY (NEEDLE) ×2 IMPLANT
NEEDLE 27GX1/2 REG BEVEL ECLIP (NEEDLE) ×2 IMPLANT
NEEDLE FILTER BLUNT 18X 1/2SAF (NEEDLE) ×1
NEEDLE FILTER BLUNT 18X1 1/2 (NEEDLE) ×1 IMPLANT
NEEDLE HYPO 25GX1X1/2 BEV (NEEDLE) IMPLANT
NEEDLE HYPO 30X.5 LL (NEEDLE) ×4 IMPLANT
NEEDLE RETROBULBAR 25GX1.5 (NEEDLE) ×2 IMPLANT
NS IRRIG 1000ML POUR BTL (IV SOLUTION) ×2 IMPLANT
PACK FRAGMATOME (OPHTHALMIC) IMPLANT
PACK VITRECTOMY CUSTOM (CUSTOM PROCEDURE TRAY) ×2 IMPLANT
PAD ARMBOARD 7.5X6 YLW CONV (MISCELLANEOUS) ×4 IMPLANT
PAK PIK VITRECTOMY CVS 25GA (OPHTHALMIC) ×2 IMPLANT
PICK MICROPICK 25G (MISCELLANEOUS) IMPLANT
PROBE ENDO DIATHERMY 25G (MISCELLANEOUS) ×2 IMPLANT
PROBE LASER ILLUM FLEX CVD 25G (OPHTHALMIC) IMPLANT
ROLLS DENTAL (MISCELLANEOUS) IMPLANT
SCRAPER DIAMOND 25GA (OPHTHALMIC RELATED) IMPLANT
SOL ANTI FOG 6CC (MISCELLANEOUS) ×1 IMPLANT
SOLUTION ANTI FOG 6CC (MISCELLANEOUS) ×1
STOPCOCK 4 WAY LG BORE MALE ST (IV SETS) IMPLANT
SUT VICRYL 7 0 TG140 8 (SUTURE) ×2 IMPLANT
SUT VICRYL 8 0 TG140 8 (SUTURE) IMPLANT
SYR 10ML LL (SYRINGE) IMPLANT
SYR 20ML LL LF (SYRINGE) ×2 IMPLANT
SYR 5ML LL (SYRINGE) ×2 IMPLANT
SYR TB 1ML LUER SLIP (SYRINGE) IMPLANT
WATER STERILE IRR 1000ML POUR (IV SOLUTION) ×2 IMPLANT

## 2020-12-07 NOTE — Brief Op Note (Signed)
12/07/2020  5:03 PM  PATIENT:  Brand Males  65 y.o. female  PRE-OPERATIVE DIAGNOSIS:  VITREOUS HEMORAGE RIGHT EYE  POST-OPERATIVE DIAGNOSIS:  VITREOUS HEMORAGE RIGHT EYE  PROCEDURE:  Procedure(s): PARS PLANA VITRECTOMY WITH 25 GAUGE (Right) PHOTOCOAGULATION WITH ENDOLASER PANRITINAL COAGULATION (Right) AIR/FLUID EXCHANGE (Right)  SURGEON:  Surgeon(s) and Role:    * Jalene Mullet, MD - Primary  PHYSICIAN ASSISTANT:   ASSISTANTS: none   ANESTHESIA:   local and MAC  EBL:  minimal   BLOOD ADMINISTERED:none  DRAINS: none   LOCAL MEDICATIONS USED:  MARCAINE    and LIDOCAINE   SPECIMEN:  No Specimen  DISPOSITION OF SPECIMEN:  N/A  COUNTS:  YES  TOURNIQUET:  * No tourniquets in log *  DICTATION: .Note written in EPIC  PLAN OF CARE: Discharge to home after PACU  PATIENT DISPOSITION:  PACU - hemodynamically stable.   Delay start of Pharmacological VTE agent (>24hrs) due to surgical blood loss or risk of bleeding: not applicable

## 2020-12-07 NOTE — Anesthesia Postprocedure Evaluation (Signed)
Anesthesia Post Note  Patient: Ariel [name]  Procedure(s) Performed: PARS PLANA VITRECTOMY WITH 25 GAUGE (Right: Eye) PHOTOCOAGULATION WITH ENDOLASER PANRITINAL COAGULATION (Right: Eye) AIR/FLUID EXCHANGE (Right: Eye)     Patient location during evaluation: PACU Anesthesia Type: MAC Level of consciousness: awake and alert Pain management: pain level controlled Vital Signs Assessment: post-procedure vital signs reviewed and stable Respiratory status: spontaneous breathing, nonlabored ventilation, respiratory function stable and patient connected to nasal cannula oxygen Cardiovascular status: stable and blood pressure returned to baseline Postop Assessment: no apparent nausea or vomiting Anesthetic complications: no   No notable events documented.  Last Vitals:  Vitals:   12/07/20 1710 12/07/20 1725  BP: 137/62 (!) 142/58  Pulse: 86 74  Resp: 13 18  Temp: (!) 36.4 C   SpO2: 100% 97%    Last Pain:  Vitals:   12/07/20 1501  TempSrc: Oral                 Medhansh Brinkmeier S

## 2020-12-07 NOTE — H&P (Signed)
Date of examination:  12/07/20  Indication for surgery: vitreous hemorrhage right eye  Pertinent past medical history:  Past Medical History:  Diagnosis Date   Chicken pox    Complication of anesthesia    Per patient, very slow to wake up after anesthesia   High cholesterol    Hyperglycemia 11/10/2020   Melanoma ([place]) 2021   right hip   PONV (postoperative nausea and vomiting)    Urinary incontinence     Pertinent ocular history:  hypertensive crisis  Pertinent family history:  Family History  Problem Relation Age of Onset   Coronary artery disease Father        died at 69   COPD Father    Alcohol abuse Father    Diabetes Mellitus II Father    Coronary artery disease Sister    Cancer Sister        breast   Heart attack Brother    Hyperlipidemia Other        died 54- "natural causes"   Hyperlipidemia Other        4 siblings    Hypertension Other        3 siblings    General:  Healthy appearing patient in no distress.    Eyes:    Acuity OD 20/200     External: Within normal limits      Anterior segment: Within normal limits        Fundus: limited view due to vitreous hemorrhage right eye    Impression: Vitreous hemorrhage right eye  Plan:  Vitrectomy with endolaser right eye  Nolon Lennert, MD

## 2020-12-07 NOTE — Anesthesia Preprocedure Evaluation (Signed)
Anesthesia Evaluation  Patient identified by MRN, date of birth, ID band Patient awake    Reviewed: Allergy & Precautions, NPO status , Patient's Chart, lab work & pertinent test results  History of Anesthesia Complications (+) PONV  Airway Mallampati: II  TM Distance: >3 FB Neck ROM: Full    Dental no notable dental hx.    Pulmonary neg pulmonary ROS,    Pulmonary exam normal breath sounds clear to auscultation       Cardiovascular negative cardio ROS Normal cardiovascular exam Rhythm:Regular Rate:Normal  Left Ventricle: Left ventricular ejection fraction, by estimation, is 60  to 65%. The left ventricle has normal function. The left ventricle has no  regional wall motion abnormalities. The left ventricular internal cavity  size was normal in size. There is  no left ventricular hypertrophy. Left ventricular diastolic parameters  are consistent with Grade I diastolic dysfunction (impaired relaxation).  Indeterminate filling pressures.   Right Ventricle: The right ventricular size is normal. No increase in  right ventricular wall thickness. Right ventricular systolic function is  normal. There is normal pulmonary artery systolic pressure. The tricuspid  regurgitant velocity is 2.36 m/s, and  with an assumed right atrial pressure of 3 mmHg, the estimated right  ventricular systolic pressure is 44.3 mmHg.   Left Atrium: Left atrial size was normal in size.   Right Atrium: Right atrial size was normal in size.   Pericardium: There is no evidence of pericardial effusion.   Mitral Valve: The mitral valve is grossly normal. Trivial mitral valve  regurgitation.   Tricuspid Valve: The tricuspid valve is grossly normal. Tricuspid valve  regurgitation is trivial.   Aortic Valve: The aortic valve is tricuspid. Aortic valve regurgitation is  not visualized.    Neuro/Psych TIAnegative psych ROS   GI/Hepatic negative GI ROS,  Neg liver ROS,   Endo/Other  Hyperthyroidism   Renal/GU negative Renal ROS  negative genitourinary   Musculoskeletal negative musculoskeletal ROS (+)   Abdominal   Peds negative pediatric ROS (+)  Hematology  (+) anemia ,   Anesthesia Other Findings   Reproductive/Obstetrics negative OB ROS                             Anesthesia Physical Anesthesia Plan  ASA: 3  Anesthesia Plan: MAC   Post-op Pain Management:    Induction: Intravenous  PONV Risk Score and Plan: 3 and Propofol infusion and Ondansetron  Airway Management Planned: Simple Face Mask  Additional Equipment:   Intra-op Plan:   Post-operative Plan:   Informed Consent: I have reviewed the patients History and Physical, chart, labs and discussed the procedure including the risks, benefits and alternatives for the proposed anesthesia with the patient or authorized representative who has indicated his/her understanding and acceptance.     Dental advisory given  Plan Discussed with: CRNA and Surgeon  Anesthesia Plan Comments:         Anesthesia Quick Evaluation

## 2020-12-07 NOTE — Transfer of Care (Signed)
Immediate Anesthesia Transfer of Care Note  Patient: Ariel [name]  Procedure(s) Performed: PARS PLANA VITRECTOMY WITH 25 GAUGE (Right: Eye) PHOTOCOAGULATION WITH ENDOLASER PANRITINAL COAGULATION (Right: Eye) AIR/FLUID EXCHANGE (Right: Eye)  Patient Location: PACU  Anesthesia Type:MAC  Level of Consciousness: awake, alert  and oriented  Airway & Oxygen Therapy: Patient Spontanous Breathing  Post-op Assessment: Report given to RN and Post -op Vital signs reviewed and stable  Post vital signs: Reviewed and stable  Last Vitals:  Vitals Value Taken Time  BP 137/62 12/07/20 1710  Temp 36.4 C 12/07/20 1710  Pulse 79 12/07/20 1712  Resp 11 12/07/20 1712  SpO2 100 % 12/07/20 1712  Vitals shown include unvalidated device data.  Last Pain:  Vitals:   12/07/20 1501  TempSrc: Oral         Complications: No notable events documented.

## 2020-12-07 NOTE — Discharge Instructions (Addendum)
DO NOT SLEEP ON BACK, THE EYE PRESSURE CAN GO UP AND CAUSE VISION LOSS   SLEEP ON SIDE WITH NOSE TO PILLOW  DURING DAY KEEP UPRIGHT 

## 2020-12-08 ENCOUNTER — Emergency Department (HOSPITAL_COMMUNITY): Payer: Medicare Other

## 2020-12-08 ENCOUNTER — Ambulatory Visit: Payer: Medicare Other

## 2020-12-08 ENCOUNTER — Other Ambulatory Visit: Payer: Medicare Other

## 2020-12-08 ENCOUNTER — Encounter (HOSPITAL_COMMUNITY): Payer: Self-pay | Admitting: Ophthalmology

## 2020-12-08 ENCOUNTER — Inpatient Hospital Stay (HOSPITAL_COMMUNITY)
Admission: EM | Admit: 2020-12-08 | Discharge: 2020-12-16 | DRG: 040 | Disposition: A | Discharge status: Home Health | Payer: Medicare Other | Attending: Internal Medicine | Admitting: Internal Medicine

## 2020-12-08 DIAGNOSIS — Z833 Family history of diabetes mellitus: Secondary | ICD-10-CM

## 2020-12-08 DIAGNOSIS — K3189 Other diseases of stomach and duodenum: Secondary | ICD-10-CM | POA: Diagnosis not present

## 2020-12-08 DIAGNOSIS — E7800 Pure hypercholesterolemia, unspecified: Secondary | ICD-10-CM | POA: Diagnosis not present

## 2020-12-08 DIAGNOSIS — Z8249 Family history of ischemic heart disease and other diseases of the circulatory system: Secondary | ICD-10-CM

## 2020-12-08 DIAGNOSIS — G936 Cerebral edema: Secondary | ICD-10-CM | POA: Diagnosis present

## 2020-12-08 DIAGNOSIS — H3561 Retinal hemorrhage, right eye: Secondary | ICD-10-CM | POA: Diagnosis not present

## 2020-12-08 DIAGNOSIS — G9389 Other specified disorders of brain: Secondary | ICD-10-CM | POA: Diagnosis not present

## 2020-12-08 DIAGNOSIS — Z20822 Contact with and (suspected) exposure to covid-19: Secondary | ICD-10-CM | POA: Diagnosis present

## 2020-12-08 DIAGNOSIS — D500 Iron deficiency anemia secondary to blood loss (chronic): Secondary | ICD-10-CM | POA: Diagnosis not present

## 2020-12-08 DIAGNOSIS — H4311 Vitreous hemorrhage, right eye: Secondary | ICD-10-CM | POA: Diagnosis present

## 2020-12-08 DIAGNOSIS — G9341 Metabolic encephalopathy: Secondary | ICD-10-CM | POA: Diagnosis present

## 2020-12-08 DIAGNOSIS — R4701 Aphasia: Secondary | ICD-10-CM | POA: Diagnosis not present

## 2020-12-08 DIAGNOSIS — E278 Other specified disorders of adrenal gland: Secondary | ICD-10-CM | POA: Diagnosis present

## 2020-12-08 DIAGNOSIS — J984 Other disorders of lung: Secondary | ICD-10-CM | POA: Diagnosis not present

## 2020-12-08 DIAGNOSIS — C8335 Diffuse large B-cell lymphoma, lymph nodes of inguinal region and lower limb: Secondary | ICD-10-CM | POA: Diagnosis not present

## 2020-12-08 DIAGNOSIS — H1131 Conjunctival hemorrhage, right eye: Secondary | ICD-10-CM | POA: Diagnosis present

## 2020-12-08 DIAGNOSIS — Z808 Family history of malignant neoplasm of other organs or systems: Secondary | ICD-10-CM | POA: Diagnosis not present

## 2020-12-08 DIAGNOSIS — D509 Iron deficiency anemia, unspecified: Secondary | ICD-10-CM | POA: Diagnosis not present

## 2020-12-08 DIAGNOSIS — Z79899 Other long term (current) drug therapy: Secondary | ICD-10-CM | POA: Diagnosis not present

## 2020-12-08 DIAGNOSIS — E871 Hypo-osmolality and hyponatremia: Secondary | ICD-10-CM | POA: Diagnosis present

## 2020-12-08 DIAGNOSIS — Z7982 Long term (current) use of aspirin: Secondary | ICD-10-CM

## 2020-12-08 DIAGNOSIS — R590 Localized enlarged lymph nodes: Secondary | ICD-10-CM | POA: Diagnosis present

## 2020-12-08 DIAGNOSIS — I10 Essential (primary) hypertension: Secondary | ICD-10-CM | POA: Diagnosis present

## 2020-12-08 DIAGNOSIS — R9431 Abnormal electrocardiogram [ECG] [EKG]: Secondary | ICD-10-CM | POA: Diagnosis not present

## 2020-12-08 DIAGNOSIS — D496 Neoplasm of unspecified behavior of brain: Secondary | ICD-10-CM | POA: Diagnosis present

## 2020-12-08 DIAGNOSIS — E785 Hyperlipidemia, unspecified: Secondary | ICD-10-CM | POA: Diagnosis present

## 2020-12-08 DIAGNOSIS — C7931 Secondary malignant neoplasm of brain: Principal | ICD-10-CM | POA: Diagnosis present

## 2020-12-08 DIAGNOSIS — C8333 Diffuse large B-cell lymphoma, intra-abdominal lymph nodes: Secondary | ICD-10-CM | POA: Diagnosis not present

## 2020-12-08 DIAGNOSIS — R29710 NIHSS score 10: Secondary | ICD-10-CM | POA: Diagnosis present

## 2020-12-08 DIAGNOSIS — Z743 Need for continuous supervision: Secondary | ICD-10-CM | POA: Diagnosis not present

## 2020-12-08 DIAGNOSIS — I69320 Aphasia following cerebral infarction: Secondary | ICD-10-CM | POA: Diagnosis not present

## 2020-12-08 DIAGNOSIS — E0590 Thyrotoxicosis, unspecified without thyrotoxic crisis or storm: Secondary | ICD-10-CM | POA: Diagnosis present

## 2020-12-08 DIAGNOSIS — I6523 Occlusion and stenosis of bilateral carotid arteries: Secondary | ICD-10-CM | POA: Diagnosis not present

## 2020-12-08 DIAGNOSIS — C4371 Malignant melanoma of right lower limb, including hip: Secondary | ICD-10-CM | POA: Diagnosis not present

## 2020-12-08 DIAGNOSIS — Z85820 Personal history of malignant melanoma of skin: Secondary | ICD-10-CM | POA: Diagnosis not present

## 2020-12-08 DIAGNOSIS — K769 Liver disease, unspecified: Secondary | ICD-10-CM | POA: Diagnosis not present

## 2020-12-08 DIAGNOSIS — R531 Weakness: Secondary | ICD-10-CM | POA: Diagnosis not present

## 2020-12-08 DIAGNOSIS — D7389 Other diseases of spleen: Secondary | ICD-10-CM | POA: Diagnosis not present

## 2020-12-08 DIAGNOSIS — R404 Transient alteration of awareness: Secondary | ICD-10-CM | POA: Diagnosis not present

## 2020-12-08 DIAGNOSIS — C439 Malignant melanoma of skin, unspecified: Secondary | ICD-10-CM | POA: Diagnosis present

## 2020-12-08 LAB — COMPREHENSIVE METABOLIC PANEL
ALT: 39 U/L (ref 0–44)
AST: 28 U/L (ref 15–41)
Albumin: 2.8 g/dL — ABNORMAL LOW (ref 3.5–5.0)
Alkaline Phosphatase: 84 U/L (ref 38–126)
Anion gap: 8 (ref 5–15)
BUN: 11 mg/dL (ref 8–23)
CO2: 26 mmol/L (ref 22–32)
Calcium: 9.5 mg/dL (ref 8.9–10.3)
Chloride: 99 mmol/L (ref 98–111)
Creatinine, Ser: 0.62 mg/dL (ref 0.44–1.00)
GFR, Estimated: >60 mL/min (ref >60)
Glucose, Bld: 131 mg/dL — ABNORMAL HIGH (ref 70–99)
Potassium: 4.2 mmol/L (ref 3.5–5.1)
Sodium: 133 mmol/L — ABNORMAL LOW (ref 135–145)
Total Bilirubin: 0.5 mg/dL (ref 0.3–1.2)
Total Protein: 8.1 g/dL (ref 6.5–8.1)

## 2020-12-08 LAB — I-STAT CHEM 8, ED
BUN: 12 mg/dL (ref 8–23)
Calcium, Ion: 1.19 mmol/L (ref 1.15–1.40)
Chloride: 98 mmol/L (ref 98–111)
Creatinine, Ser: 0.5 mg/dL (ref 0.44–1.00)
Glucose, Bld: 129 mg/dL — ABNORMAL HIGH (ref 70–99)
HCT: 34 % — ABNORMAL LOW (ref 36.0–46.0)
Hemoglobin: 11.6 g/dL — ABNORMAL LOW (ref 12.0–15.0)
Potassium: 4.2 mmol/L (ref 3.5–5.1)
Sodium: 135 mmol/L (ref 135–145)
TCO2: 26 mmol/L (ref 22–32)

## 2020-12-08 LAB — CBC
HCT: 34.7 % — ABNORMAL LOW (ref 36.0–46.0)
Hemoglobin: 10.4 g/dL — ABNORMAL LOW (ref 12.0–15.0)
MCH: 23.6 pg — ABNORMAL LOW (ref 26.0–34.0)
MCHC: 30 g/dL (ref 30.0–36.0)
MCV: 78.9 fL — ABNORMAL LOW (ref 80.0–100.0)
Platelets: 425 10*3/uL — ABNORMAL HIGH (ref 150–400)
RBC: 4.4 MIL/uL (ref 3.87–5.11)
RDW: 17.6 % — ABNORMAL HIGH (ref 11.5–15.5)
WBC: 10 10*3/uL (ref 4.0–10.5)
nRBC: 0 % (ref 0.0–0.2)

## 2020-12-08 LAB — CBG MONITORING, ED: Glucose-Capillary: 125 mg/dL — ABNORMAL HIGH (ref 70–99)

## 2020-12-08 LAB — DIFFERENTIAL
Abs Immature Granulocytes: 0.07 10*3/uL (ref 0.00–0.07)
Basophils Absolute: 0 10*3/uL (ref 0.0–0.1)
Basophils Relative: 0 %
Eosinophils Absolute: 0 10*3/uL (ref 0.0–0.5)
Eosinophils Relative: 0 %
Immature Granulocytes: 1 %
Lymphocytes Relative: 16 %
Lymphs Abs: 1.6 10*3/uL (ref 0.7–4.0)
Monocytes Absolute: 0.8 10*3/uL (ref 0.1–1.0)
Monocytes Relative: 8 %
Neutro Abs: 7.5 10*3/uL (ref 1.7–7.7)
Neutrophils Relative %: 75 %

## 2020-12-08 LAB — RESP PANEL BY RT-PCR (FLU A&B, COVID) ARPGX2
Influenza A by PCR: NEGATIVE
Influenza B by PCR: NEGATIVE
SARS Coronavirus 2 by RT PCR: NEGATIVE

## 2020-12-08 LAB — APTT: aPTT: 31 seconds (ref 24–36)

## 2020-12-08 LAB — PROTIME-INR
INR: 1 (ref 0.8–1.2)
Prothrombin Time: 13.3 seconds (ref 11.4–15.2)

## 2020-12-08 IMAGING — CT CT HEAD CODE STROKE
3 series · 14 of 47 positions shown, 16 images · non-contrast
Comparison: Brain MRI [DATE]

CLINICAL DATA: Code stroke.

EXAM:
CT HEAD WITHOUT CONTRAST
TECHNIQUE: Contiguous axial images were obtained from the base of the skull
through the vertex without intravenous contrast.

[Series 2: head 5.0 st · axial · 0.42mm/px · z∈[-93,+42]mm · 8 of 33 slices shown, 10 images]
[im 3/33  brain]
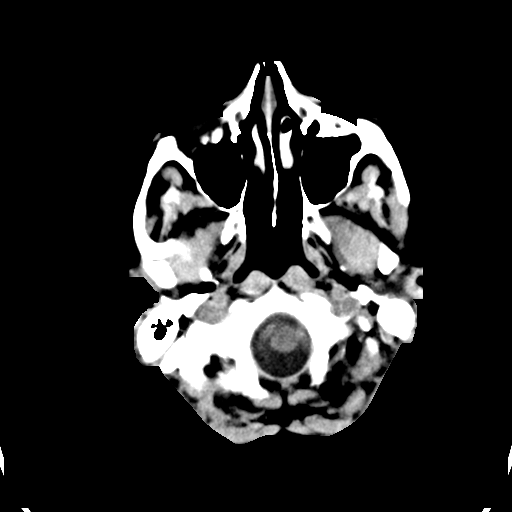
[im 3/33  bone]
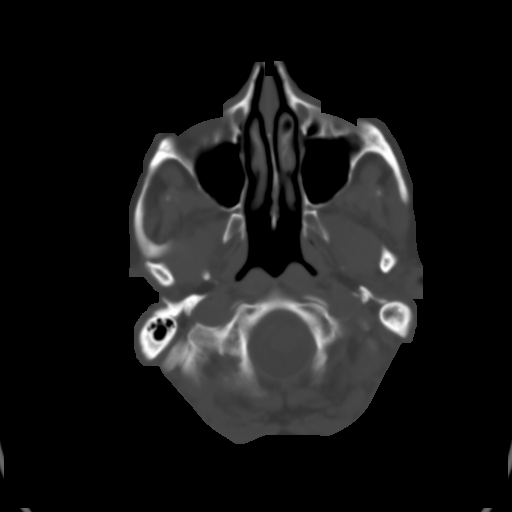
[im 7/33  brain]
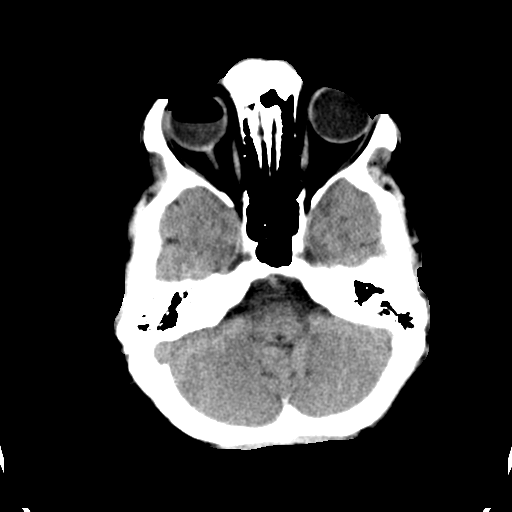
[im 10/33  brain]
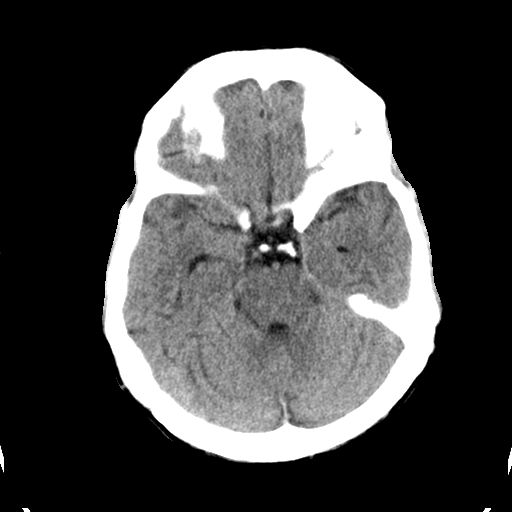
[im 15/33  brain]
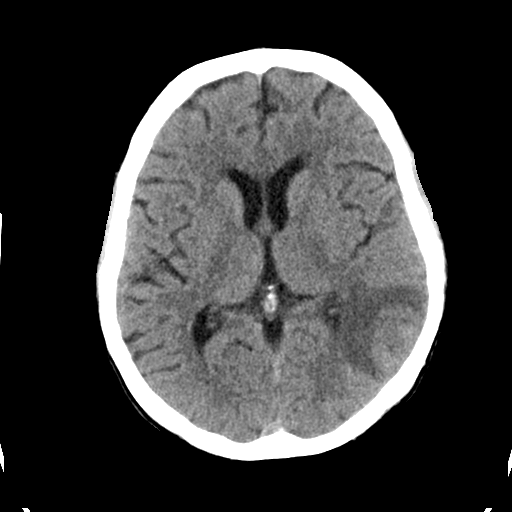
[im 18/33  brain]
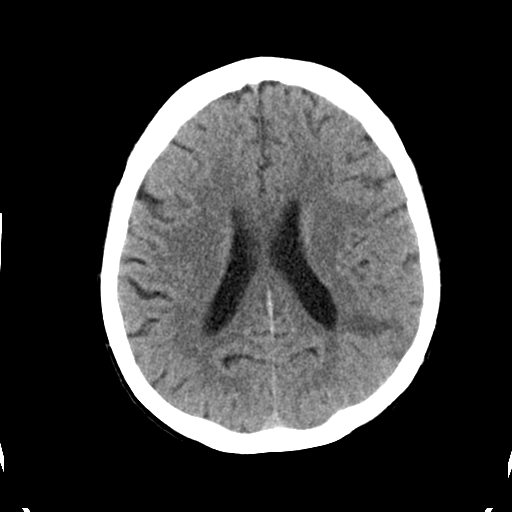
[im 18/33  bone]
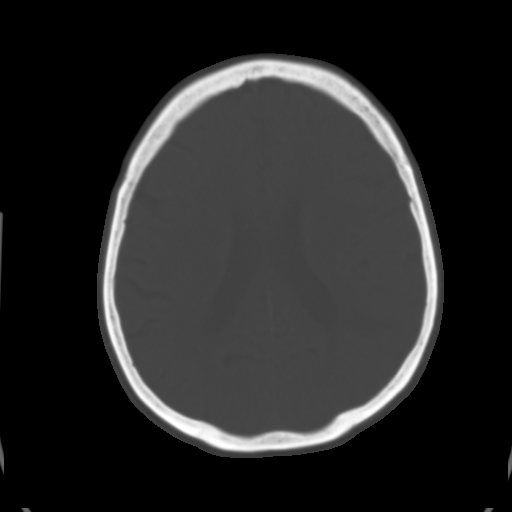
[im 23/33  brain]
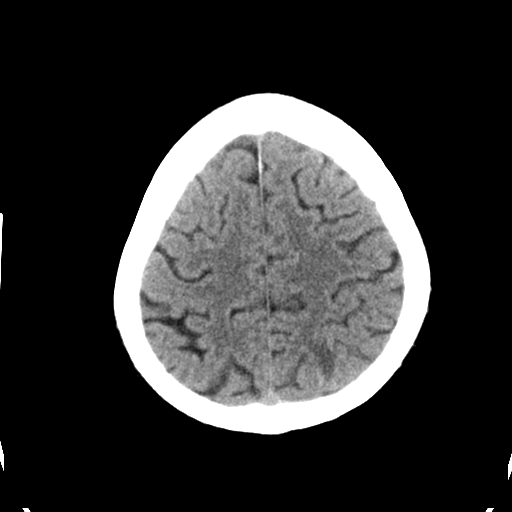
[im 26/33  brain]
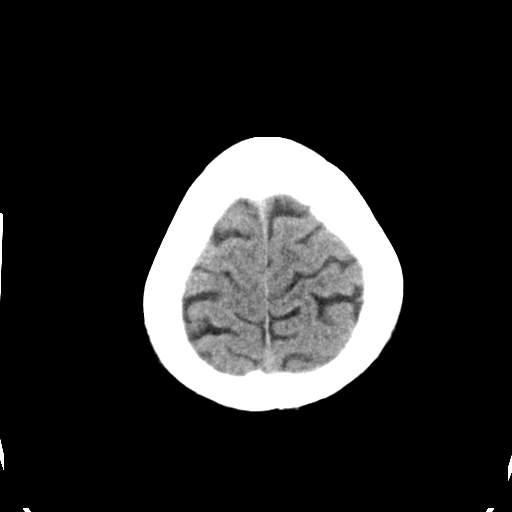
[im 30/33  brain]
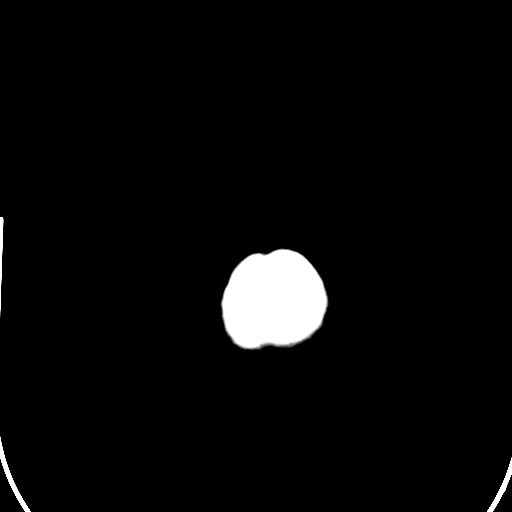

[Series 5: head 3.0 cor st · coronal · 0.32mm/px · 3 of 67 slices shown]
[im 23/67  brain]
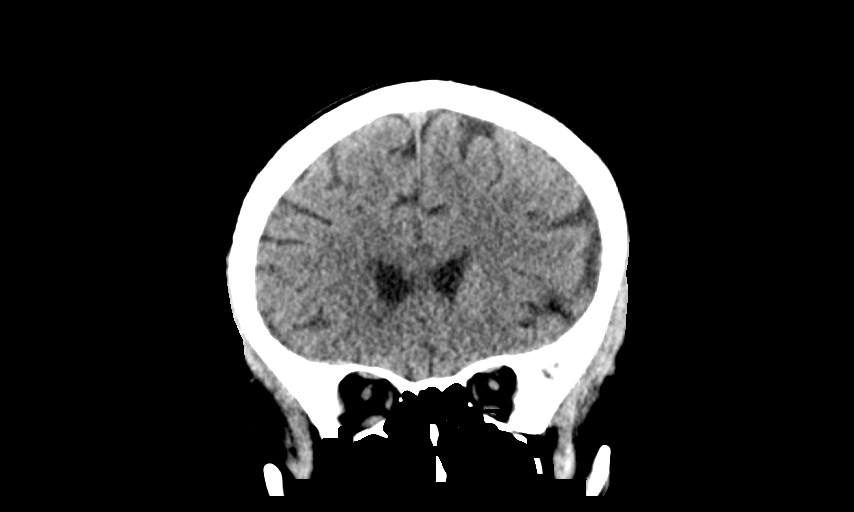
[im 30/67  brain]
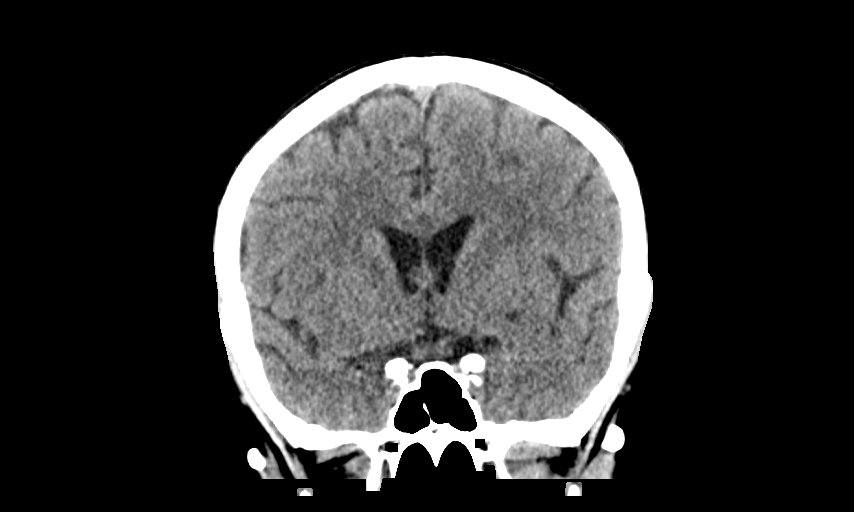
[im 37/67  brain]
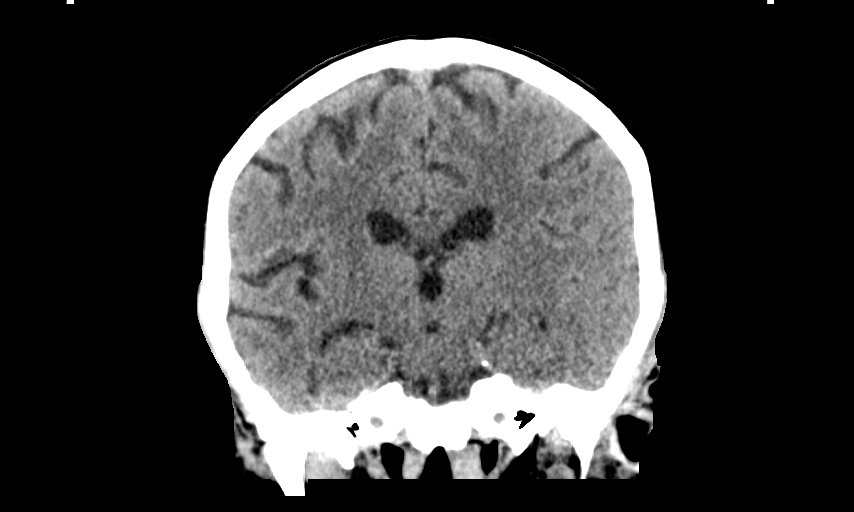

[Series 6: head 3.0 sag st · sagittal · 0.32mm/px · 3 of 67 slices shown]
[im 23/67  brain]
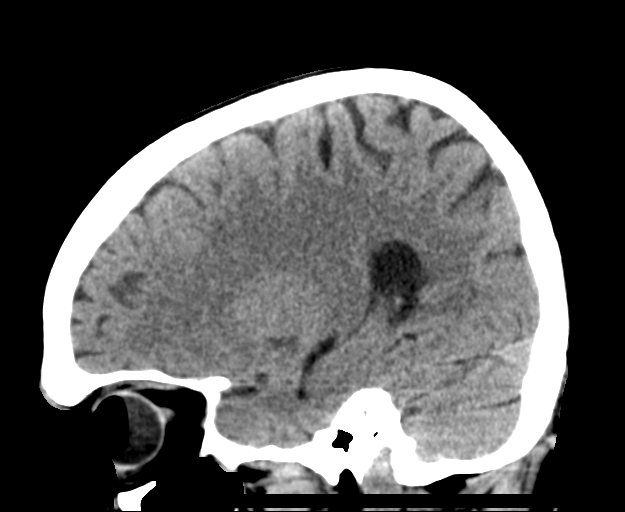
[im 34/67  brain]
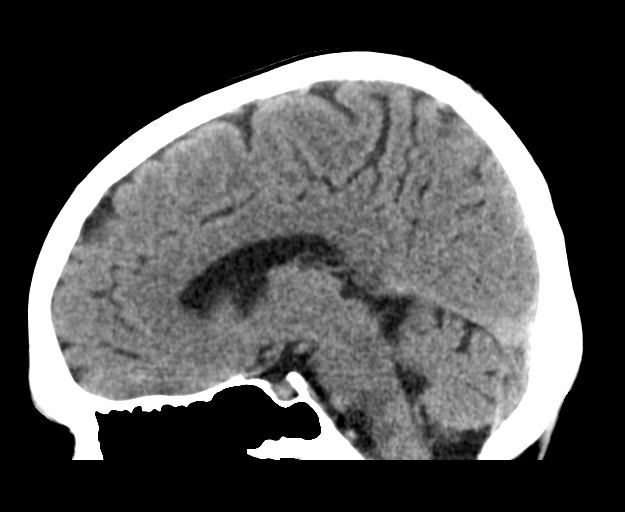
[im 45/67  brain]
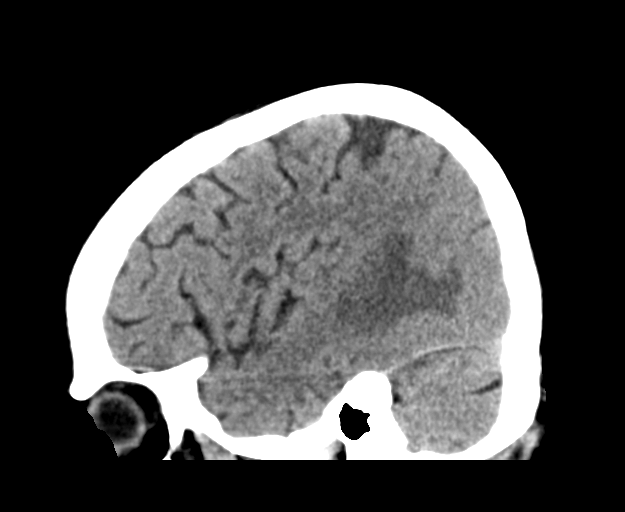

[14 of 47 positions shown; findings below may reference images not displayed]

FINDINGS: Brain: There is edema in the left temporal lobe posteriorly which
appears to spare the overlying gray matter. There is effacement of
the surrounding sulci and partial effacement of the occipital horn
of the left lateral ventricle. There is dish in all hypodensity in
the left superior parietal lobule with sparing of the overlying
cortex (5-49). There is no evidence of acute intracranial
hemorrhage. There is no extra-axial fluid collection.

There is no midline shift.

Vascular: There is mild calcification of the intracranial ICAs.
There is no dense vessel.

Skull: Normal. Negative for fracture or focal lesion.

Sinuses/Orbits: There is air in the right globe which is new since
the prior study, likely postprocedural. The imaged paranasal sinuses
are clear.

Other: None.

ASPECTS (Alberta Stroke Program Early CT Score)

- Ganglionic level infarction (caudate, lentiform nuclei, internal
capsule, insula, M1-M3 cortex): 7

- Supraganglionic infarction (M4-M6 cortex): 3

Total score (0-10 with 10 being normal): 10
IMPRESSION: 1. Edema in the left temporal and parietal lobes which appears to
spare the overlying cortex is felt unlikely to be related to
evolving infarct, and is more concerning for underlying mass lesion
given the history of mono melanoma. Recommend MRI of the brain with
and without contrast for further evaluation.
2. ASPECTS is 10
3. Air in the right globe is likely postprocedural.

These results were called by telephone at the time of interpretation
on [DATE] at [DATE] to provider Dr DI STEFANO , who verbally
acknowledged these results.

## 2020-12-08 IMAGING — MR MR HEAD WO/W CM
8 of 13 series · 32 of 48 positions shown · IV contrast (gadavist)
Comparison: Same-day noncontrast CT head, brain MRI [DATE]
COMPARISON: Same-day noncontrast CT head, brain MRI [DATE]

Addendum:
CLINICAL DATA: History of melanoma, evaluate for metastatic disease

EXAM:
MRI HEAD WITHOUT AND WITH CONTRAST
TECHNIQUE: Multiplanar, multiecho pulse sequences of the brain and surrounding
structures were obtained without and with intravenous contrast.
CONTRAST:  7mL GADAVIST GADOBUTROL 1 MMOL/ML IV SOLN

[Series 3: DWI · axial · 3.0mm · 1.09mm/px · z∈[-53,+94]mm · 9 of 100 slices shown (1 of 4)]
[im 1/100]
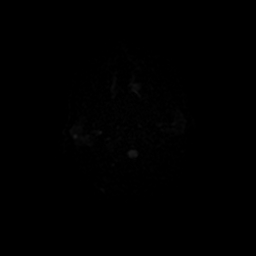
[im 13/100]
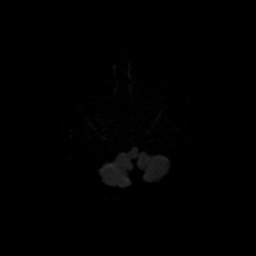
[im 25/100]
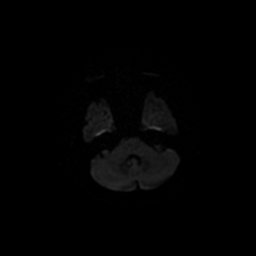
[im 38/100]
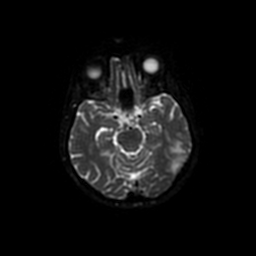
[im 50/100]
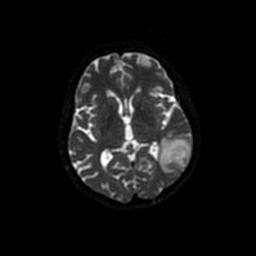
[im 62/100]
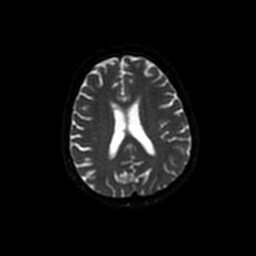
[im 75/100]
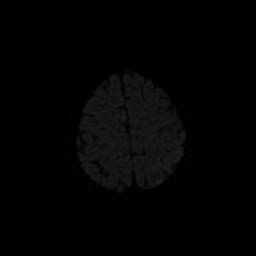
[im 87/100]
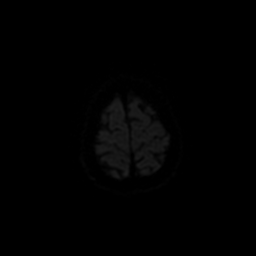
[im 100/100]
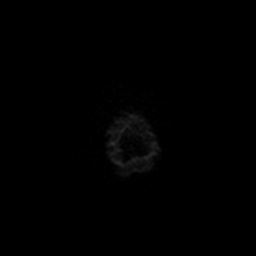

[Series 4: DWI · coronal · 5.0mm · 1.09mm/px · 6 of 74 slices shown (2 of 4)]
[im 1/74]
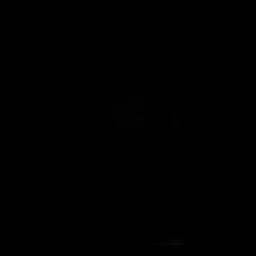
[im 15/74]
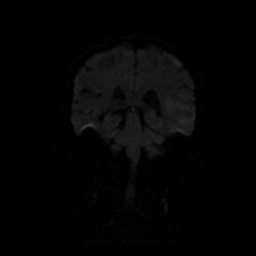
[im 30/74]
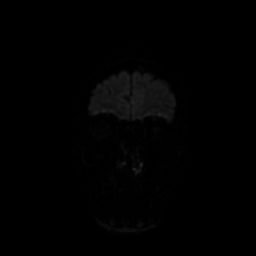
[im 44/74]
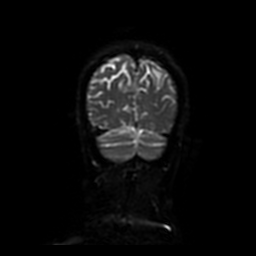
[im 59/74]
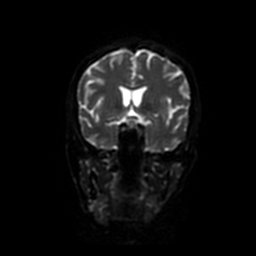
[im 74/74]
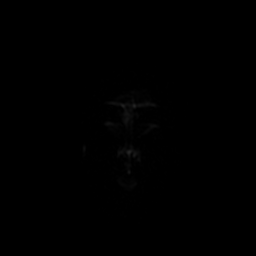

[Series 9: T1 post-contrast · axial · 3.0mm · 0.47mm/px · z∈[-60,+87]mm · 4 of 50 slices shown (1 of 3)]
[im 1/50]
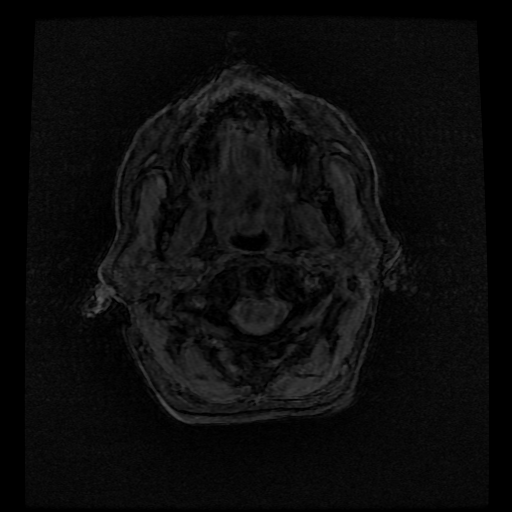
[im 17/50]
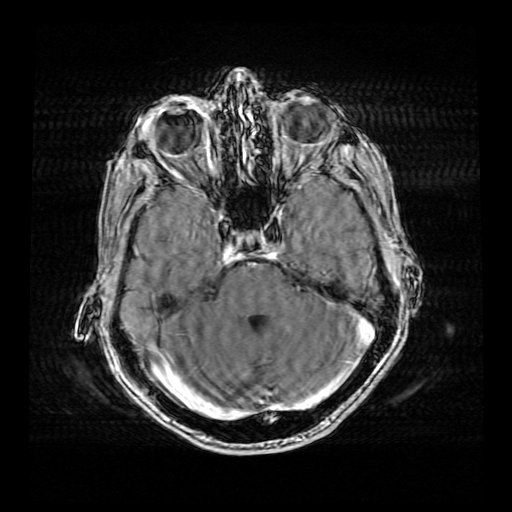
[im 33/50]
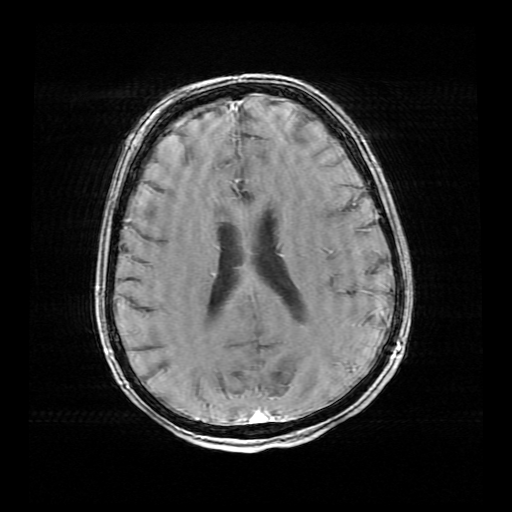
[im 50/50]
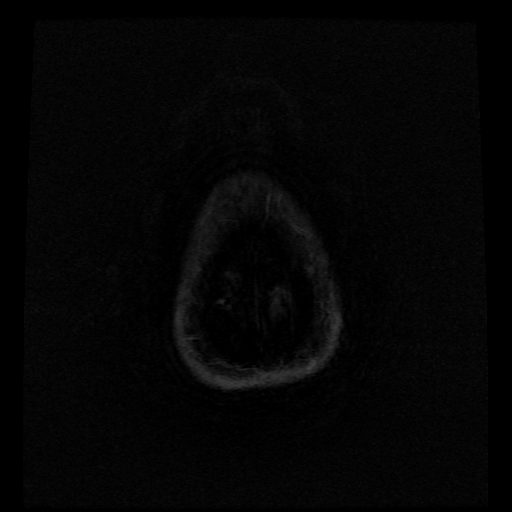

[Series 10: T1 post-contrast · coronal · 5.0mm · 0.43mm/px · 2 of 24 slices shown (2 of 3)]
[im 1/24]
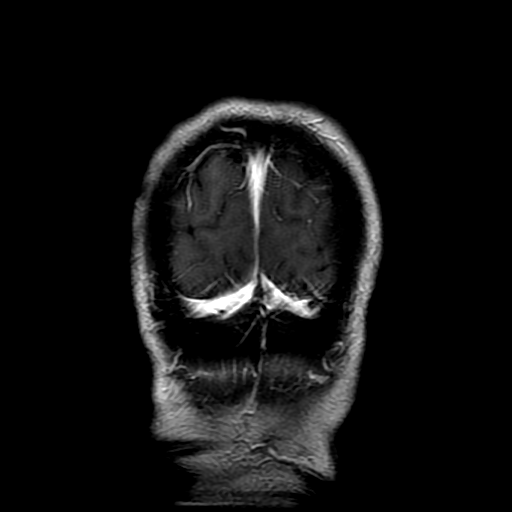
[im 24/24]
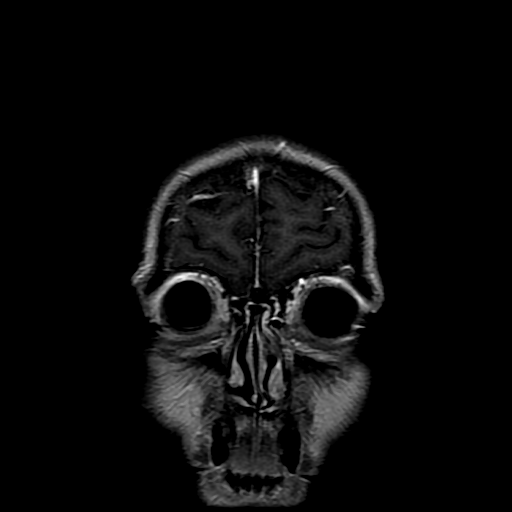

[Series 11: T1 post-contrast · sagittal · 5.0mm · 0.47mm/px · 2 of 24 slices shown (3 of 3)]
[im 1/24]
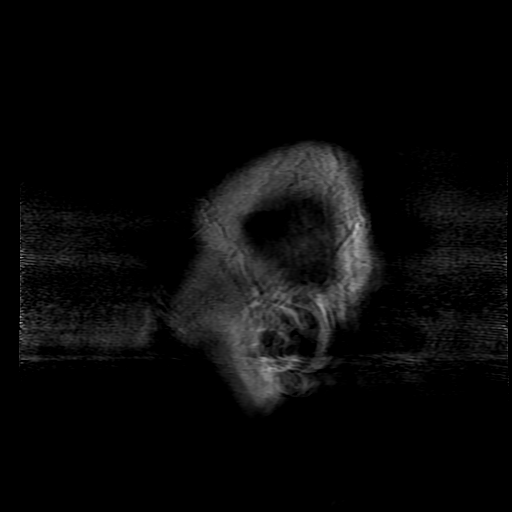
[im 24/24]
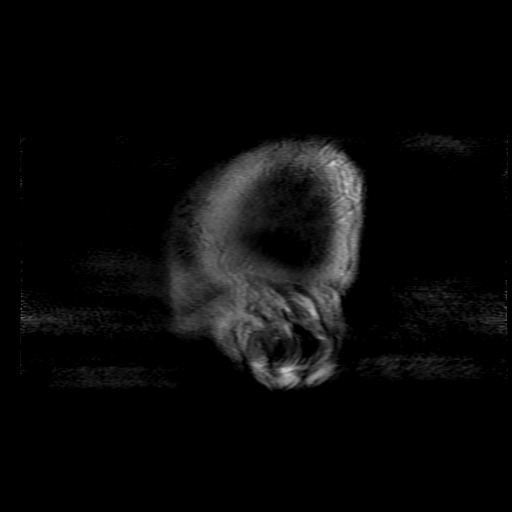

[Series 12: FLAIR · axial · 5.0mm · 0.43mm/px · z∈[-58,+86]mm · 2 of 25 slices shown]
[im 1/25]
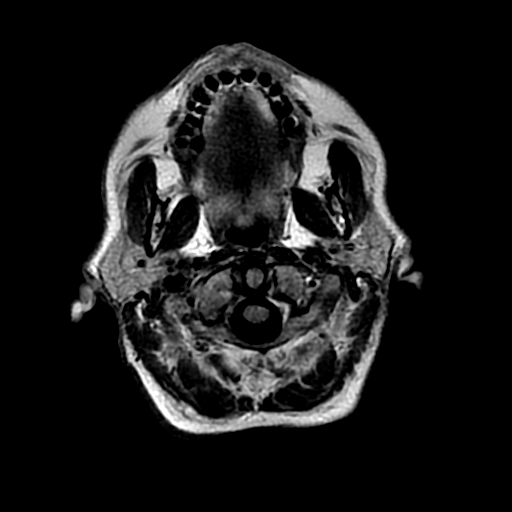
[im 25/25]
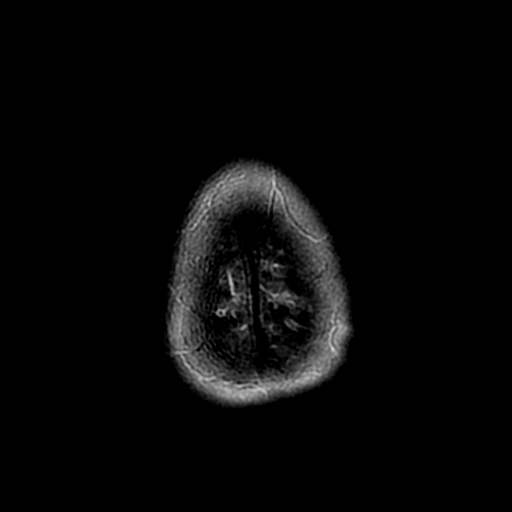

[Series 300: DWI · axial · 3.0mm · 1.09mm/px · z∈[-53,+94]mm · 4 of 50 slices shown (3 of 4)]
[im 1/50]
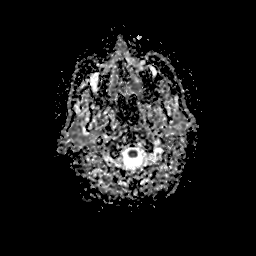
[im 17/50]
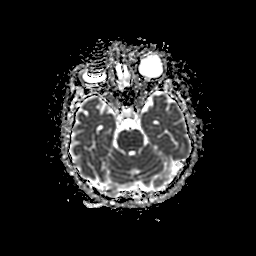
[im 33/50]
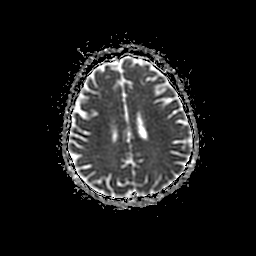
[im 50/50]
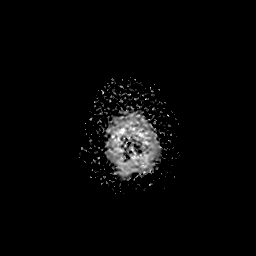

[Series 400: DWI · coronal · 5.0mm · 1.09mm/px · 3 of 36 slices shown (4 of 4)]
[im 1/36]
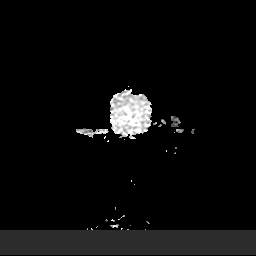
[im 18/36]
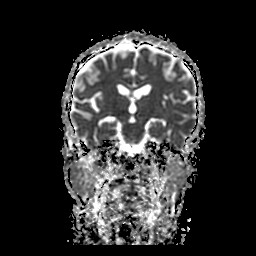
[im 36/36]
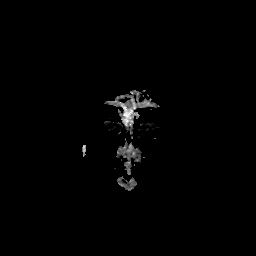

[32 of 48 positions shown; findings below may reference images not displayed]

FINDINGS: Brain: There is an enhancing lesion in the left posterior temporal
lobe measuring 1.9 cm by 1.9 cm x 2.2 cm consistent with metastatic
disease. There is surrounding vasogenic edema resulting in
effacement of the adjacent sulci and slight effacement of the
occipital horn of the left lateral ventricle.

There is an additional enhancing lesion in the left superior
parietal lobule measuring approximately 0.9 cm TV by 1.3 cm cc in
the coronal plane. The lesion is not well evaluated in the axial
plane due to motion artifact. There is mild vasogenic edema
surrounding this lesion without significant mass effect.

No other enhancing lesions are seen. There is no evidence of acute
intracranial hemorrhage or infarct. A small focus of susceptibility
in the left frontal lobe is nonspecific. There is no midline shift.

Vascular: Normal flow voids.

Skull and upper cervical spine: There is no definite abnormal signal
abnormality, though the upper cervical spine is not well evaluated
due to significant motion artifact.

Sinuses/Orbits: The paranasal sinuses are clear. T2 hypointensity in
the right globe is consistent with tear related to a prior procedure
as seen on prior CT.

Other: None.
IMPRESSION: Two enhancing lesions in the left cerebral hemisphere, the larger in
the posterior temporal lobe measuring up to 1.9 cm x 1.9 cm x 2.2 cm
most concerning for metastatic disease given the patient's history
and multiplicity. There is edema surrounding the lesions, more so in
the left temporal lobe, without midline shift.

ADDENDUM:
There is additional ill-defined enhancement in the left postcentral
gyrus with associated diffusion restriction suspicious for an
additional metastatic lesion (9-43). This was discussed at
multidisciplinary conference on the morning of [DATE].

*** End of Addendum ***
FINDINGS: Brain: There is an enhancing lesion in the left posterior temporal
lobe measuring 1.9 cm by 1.9 cm x 2.2 cm consistent with metastatic
disease. There is surrounding vasogenic edema resulting in
effacement of the adjacent sulci and slight effacement of the
occipital horn of the left lateral ventricle.

There is an additional enhancing lesion in the left superior
parietal lobule measuring approximately 0.9 cm TV by 1.3 cm cc in
the coronal plane. The lesion is not well evaluated in the axial
plane due to motion artifact. There is mild vasogenic edema
surrounding this lesion without significant mass effect.

No other enhancing lesions are seen. There is no evidence of acute
intracranial hemorrhage or infarct. A small focus of susceptibility
in the left frontal lobe is nonspecific. There is no midline shift.

Vascular: Normal flow voids.

Skull and upper cervical spine: There is no definite abnormal signal
abnormality, though the upper cervical spine is not well evaluated
due to significant motion artifact.

Sinuses/Orbits: The paranasal sinuses are clear. T2 hypointensity in
the right globe is consistent with tear related to a prior procedure
as seen on prior CT.

Other: None.
IMPRESSION: Two enhancing lesions in the left cerebral hemisphere, the larger in
the posterior temporal lobe measuring up to 1.9 cm x 1.9 cm x 2.2 cm
most concerning for metastatic disease given the patient's history
and multiplicity. There is edema surrounding the lesions, more so in
the left temporal lobe, without midline shift.

## 2020-12-08 MED ORDER — ERYTHROMYCIN 5 MG/GM OP OINT
TOPICAL_OINTMENT | Freq: Two times a day (BID) | OPHTHALMIC | Status: AC
  Administered 2020-12-09: 1 via OPHTHALMIC
  Filled 2020-12-08: qty 3.5

## 2020-12-08 MED ORDER — SODIUM CHLORIDE 0.9% FLUSH
3.0000 mL | Freq: Two times a day (BID) | INTRAVENOUS | Status: DC
  Administered 2020-12-09 – 2020-12-16 (×16): 3 mL via INTRAVENOUS

## 2020-12-08 MED ORDER — DEXAMETHASONE SODIUM PHOSPHATE 10 MG/ML IJ SOLN: 6.0000 mg | INTRAMUSCULAR | Status: DC

## 2020-12-08 MED ORDER — SODIUM CHLORIDE 0.9 % IV SOLN: 250.0000 mL | INTRAVENOUS | Status: DC | PRN

## 2020-12-08 MED ORDER — SODIUM CHLORIDE 0.9% FLUSH: 3.0000 mL | INTRAVENOUS | Status: DC | PRN

## 2020-12-08 MED ORDER — OFLOXACIN 0.3 % OP SOLN
1.0000 [drp] | Freq: Four times a day (QID) | OPHTHALMIC | Status: DC
  Administered 2020-12-08 – 2020-12-16 (×29): 1 [drp] via OPHTHALMIC
  Filled 2020-12-08: qty 5

## 2020-12-08 MED ORDER — TENECTEPLASE FOR STROKE: 0.2500 mg/kg | PACK | Freq: Once | INTRAVENOUS | Status: DC

## 2020-12-08 MED ORDER — PROPARACAINE HCL 0.5 % OP SOLN
1.0000 [drp] | Freq: Once | OPHTHALMIC | Status: AC
  Administered 2020-12-08: 1 [drp] via OPHTHALMIC
  Filled 2020-12-08: qty 15

## 2020-12-08 MED ORDER — GADOBUTROL 1 MMOL/ML IV SOLN
7.0000 mL | Freq: Once | INTRAVENOUS | Status: AC | PRN
  Administered 2020-12-08: 7 mL via INTRAVENOUS

## 2020-12-08 MED ORDER — DEXAMETHASONE SODIUM PHOSPHATE 10 MG/ML IJ SOLN
10.0000 mg | Freq: Once | INTRAMUSCULAR | Status: AC
  Administered 2020-12-08: 10 mg via INTRAVENOUS
  Filled 2020-12-08: qty 1

## 2020-12-08 MED ORDER — LORAZEPAM 2 MG/ML IJ SOLN
1.0000 mg | Freq: Once | INTRAMUSCULAR | Status: AC
  Administered 2020-12-08: 1 mg via INTRAVENOUS
  Filled 2020-12-08: qty 1

## 2020-12-08 MED ORDER — ACETAMINOPHEN 325 MG PO TABS
650.0000 mg | ORAL_TABLET | Freq: Four times a day (QID) | ORAL | Status: DC | PRN
  Administered 2020-12-08 – 2020-12-09 (×2): 650 mg via ORAL
  Filled 2020-12-08 (×3): qty 2

## 2020-12-08 MED ORDER — IOHEXOL 350 MG/ML SOLN
100.0000 mL | Freq: Once | INTRAVENOUS | Status: AC | PRN
  Administered 2020-12-08: 100 mL via INTRAVENOUS

## 2020-12-08 MED ORDER — PREDNISOLONE ACETATE 1 % OP SUSP
1.0000 [drp] | Freq: Four times a day (QID) | OPHTHALMIC | Status: DC
  Administered 2020-12-08 – 2020-12-16 (×29): 1 [drp] via OPHTHALMIC
  Filled 2020-12-08: qty 5

## 2020-12-08 MED ORDER — ACETAMINOPHEN 650 MG RE SUPP: 650.0000 mg | Freq: Four times a day (QID) | RECTAL | Status: DC | PRN

## 2020-12-08 MED ORDER — LEVETIRACETAM 500 MG PO TABS
500.0000 mg | ORAL_TABLET | Freq: Two times a day (BID) | ORAL | Status: DC
  Administered 2020-12-08 – 2020-12-16 (×17): 500 mg via ORAL
  Filled 2020-12-08 (×17): qty 1

## 2020-12-08 NOTE — Consult Note (Signed)
Ariel [name]                                                                               12/08/2020                               HPI:   Ophthalmology Post-op  and consultation after recent admission for stroke with new brain mets noted from melanoma primary.  Patient was pleasant and able to respond minimally and clearly has expressive aphasia.       Pertinent Medical History:   Active Ambulatory Problems    Diagnosis Date Noted   Routine general medical examination at a health care facility 11/18/2012   Hyperlipidemia 11/19/2012   Urinary incontinence 11/19/2013   Subclinical hyperthyroidism 12/19/2013   Multinodular goiter 12/22/2013   Melanoma ([place]) 2021   Aphasia 10/06/2020   TIA (transient ischemic attack) 10/07/2020   Iron deficiency anemia 10/07/2020   Skin rash 11/10/2020   Tachycardia 11/10/2020   Anemia 11/10/2020   Hyperglycemia 11/10/2020   Chicken pox 35/32/9924   Complication of anesthesia 11/22/2020   PONV (postoperative nausea and vomiting) 11/22/2020   Night sweat 11/30/2020   Resolved Ambulatory Problems    Diagnosis Date Noted   No Resolved Ambulatory Problems   Past Medical History:  Diagnosis Date   High cholesterol      Pertinent Ophthalmic History: recent vitrectomy for vitreous hemorrhage  Current Eye Medications: post-op drops not started   Systemic medications on admission:   Medications Prior to Admission  Medication Sig Dispense Refill   aspirin EC 81 MG tablet Take 81 mg by mouth daily. Swallow whole.     betamethasone dipropionate 0.05 % cream Apply topically 2 (two) times daily. (Patient not taking: No sig reported) 30 g 1        Visual Fields: unable to determine   Pupils:  Reactive with some residual pharmacologic dilation OD  Near acuity:   unable to determine - Patient states she is able to read 20/100, however unable to express clearly what she can read.  TA:       OD: 6 mmhg   by tonopen    External:   OD:   ptosis     Anterior segment exam:  By penlight     Conjunctiva:  OD:  subconj hemorrhage right eye  Cornea:    OD: Clear,   OS: Clear,  Anterior Chamber:   OD:  slightly shallow   OS:  Deep/quiet    Iris:    OD:  Normal      OS:  Normal     Lens:    OD:  Clear      OS:  Clear     Fundus - air/bubble obscures view of posterior pole  Impression:   S/p PPVx and endolaser right eye  Subconj hemorrhage right eye  Recommendations/Plan:  Pred Forte and Ofloxacin QID.  Start Erythromycin ophthalmic ointment BID for conjunctival hemorrhage right eye  F/u with Dr. Posey Pronto upon discharge  I've discussed these findings with the nurse.  Please contact our office with any questions or concerns at [phone].   Dareen Piano  Posey Pronto, MD

## 2020-12-08 NOTE — ED Notes (Signed)
Pt was sating 87-88% on RA with a good wave form. This RN placed pt on 2L [place]. Pt is now sating 99% with a good wave form. Will continue to monitor.

## 2020-12-08 NOTE — Consult Note (Signed)
CC:receptive aphasia  HPI:     Patient is a 65 y.o. female with hx of melanoma s/p excision in 2021 presented to the hospital for receptive aphasia since this morning.  She had similar symptoms that were transient in August when she was diagnosed with a TIA.  She had underwent a right eye procedure for retinal hemorrhage yesterday.      Patient Active Problem List   Diagnosis Date Noted   Brain tumor ([place]) 12/08/2020   Night sweat 11/30/2020   Chicken pox 65/04/5463   Complication of anesthesia 11/22/2020   PONV (postoperative nausea and vomiting) 11/22/2020   Skin rash 11/10/2020   Tachycardia 11/10/2020   Anemia 11/10/2020   Hyperglycemia 11/10/2020   TIA (transient ischemic attack) 10/07/2020   Iron deficiency anemia 10/07/2020   Aphasia 10/06/2020   Melanoma ([place]) 2021   Multinodular goiter 12/22/2013   Subclinical hyperthyroidism 12/19/2013   Urinary incontinence 11/19/2013   Hyperlipidemia 11/19/2012   Routine general medical examination at a health care facility 11/18/2012   Past Medical History:  Diagnosis Date   Chicken pox    Complication of anesthesia    Per patient, very slow to wake up after anesthesia   High cholesterol    Hyperglycemia 11/10/2020   Melanoma ([place]) 2021   right hip   PONV (postoperative nausea and vomiting)    Urinary incontinence     Past Surgical History:  Procedure Laterality Date   AIR/FLUID EXCHANGE Right 12/07/2020   Procedure: AIR/FLUID EXCHANGE;  Surgeon: Jalene Mullet, MD;  Location: [place];  Service: Ophthalmology;  Laterality: Right;   APPENDECTOMY  1962   EXCISION MELANOMA WITH SENTINEL LYMPH NODE BIOPSY Right 10/09/2019   Procedure: WIDE LOCAL EXCISION WITH ADVANCEMENT FLAP CLOSURE RIGHT HIP MELANOMA WITH SENTINEL LYMPH NODE BIOPSY;  Surgeon: Stark Klein, MD;  Location: [place];  Service: General;  Laterality: Right;   HERNIA REPAIR  2009   MOUTH SURGERY  11/2015   gum surgery and bone implant   PARS PLANA VITRECTOMY Right  12/07/2020   Procedure: PARS PLANA VITRECTOMY WITH 25 GAUGE;  Surgeon: Jalene Mullet, MD;  Location: [place];  Service: Ophthalmology;  Laterality: Right;   PHOTOCOAGULATION WITH LASER Right 12/07/2020   Procedure: PHOTOCOAGULATION WITH ENDOLASER PANRITINAL COAGULATION;  Surgeon: Jalene Mullet, MD;  Location: [place];  Service: Ophthalmology;  Laterality: Right;   WISDOM TOOTH EXTRACTION      (Not in a hospital admission)  Allergies  Allergen Reactions   Codeine Nausea Only   Plavix [Clopidogrel]     Hives,lip swelling     Social History   Tobacco Use   Smoking status: Never   Smokeless tobacco: Never  Substance Use Topics   Alcohol use: Never    Family History  Problem Relation Age of Onset   Coronary artery disease Father        died at 97   COPD Father    Alcohol abuse Father    Diabetes Mellitus II Father    Coronary artery disease Sister    Cancer Sister        breast   Heart attack Brother    Hyperlipidemia Other        died 60- "natural causes"   Hyperlipidemia Other        4 siblings    Hypertension Other        3 siblings     Review of Systems Pertinent items noted in HPI and remainder of comprehensive ROS otherwise negative.  Objective:  Patient Vitals for the past 8 hrs:  BP Temp Temp src Pulse Resp SpO2 Height Weight  12/08/20 1445 (!) 142/66 -- -- 84 14 96 % -- --  12/08/20 1430 (!) 142/71 -- -- 87 13 97 % -- --  12/08/20 1415 139/73 -- -- 84 14 97 % -- --  12/08/20 1400 (!) 150/71 -- -- 86 15 97 % -- --  12/08/20 1330 132/65 -- -- 71 16 95 % -- --  12/08/20 1300 139/70 -- -- 84 16 98 % -- --  12/08/20 1207 (!) 143/74 -- -- 86 16 100 % -- --  12/08/20 1030 (!) 144/69 -- -- 98 (!) 22 99 % -- --  12/08/20 1018 -- -- -- -- -- -- 5\' 6"  (1.676 m) 64.8 kg  12/08/20 1015 (!) 161/85 -- -- (!) 123 20 100 % -- --  12/08/20 1000 135/68 -- -- 87 15 99 % -- --  12/08/20 0945 126/64 -- -- 87 17 100 % -- --  12/08/20 0930 128/60 -- -- 88 20 98 % -- --   12/08/20 0927 (!) 143/59 -- -- -- -- 100 % -- --  12/08/20 0922 -- 97.8 F (36.6 C) Oral 94 16 -- -- --   No intake/output data recorded. No intake/output data recorded.    General : Alert, cooperative, no distress, appears stated age   Head:  Normocephalic/atraumatic    Eyes: PERRL, conjunctiva/corneas clear, EOM's intact. Fundi could not be visualized Neck: Supple Chest:  Respirations unlabored Chest wall: no tenderness or deformity Heart: Regular rate and rhythm Abdomen: Soft, nontender and nondistended Extremities: warm and well-perfused Skin: normal turgor, color and texture Neurologic:  Alert, oriented to person. Receptive aphasia, affecting repetition.   Eyes open spontaneously. PERRL, EOMI, VFC, no facial droop. Right eye ptosis and conjunctival hemorrhage noted. V1-3 intact.  No dysarthria, tongue protrusion symmetric.  CNII-XII intact. Normal strength, sensation and reflexes throughout.  No pronator drift, full strength in legs       Data ReviewCBC:  Lab Results  Component Value Date   WBC 10.0 12/08/2020   RBC 4.40 12/08/2020   BMP:  Lab Results  Component Value Date   GLUCOSE 129 (H) 12/08/2020   CO2 26 12/08/2020   BUN 12 12/08/2020   CREATININE 0.50 12/08/2020   CREATININE 0.59 12/01/2020   CREATININE 0.68 11/18/2012   CALCIUM 9.5 12/08/2020   Radiology review: MRI brain with and without contrast performed today was reviewed.  There is a 2 cm enhancing lesion involving the left posterior superior and middle temporal gyri.  There is a small amount of surrounding vasogenic edema.  There is a smaller lesion in the left superior parietal lobule.  The left temporal abnormality can be seen on previous MRI brain without contrast from August.  Assessment:   Active Problems:   Hyperlipidemia   Melanoma ([place])   Aphasia   Iron deficiency anemia   Brain tumor ([place])   Plan:   This is a 64 year old woman with melanoma who has metastatic brain disease, with  metastatic lesions in her left temporal and parietal lobes.   - I had a long discussion with the patient's son at the bedside.  Treatment options were discussed with him.  I am recommending stereotactic radiosurgery for these lesions as a seem to be suitable size for radiation, and since her larger lesion is in the speech reception area of the brain.  This can be arranged on an outpatient basis.  She will also likely  need systemic restaging.  She does not seem to be at her baseline, but I do expect with antiepileptics and steroids, she will likely regain her speech.  I discussed her case with Dr. Mickeal Skinner of neuro-oncology.

## 2020-12-08 NOTE — Progress Notes (Signed)
Neurosurgery  65 yo F with hx of melanoma excision in 2021 who presented to the hospital with aphasia.  She was found to have enhancing lesions in her left posterior temporal lobe and parietal lobe.  Her left temporal lobe lesion measures 2 cm, with small amount of surrounding edema.  I would favor radiosurgical treatment given multiplicity, the size < 2.5 cm and location of larger lesion in posterior superior and middle left temporal gyri. In the meantime, I would recommend a long dexamethasone taper and Keppra, with EEG if patient does not return to baseline.

## 2020-12-08 NOTE — Code Documentation (Signed)
Stroke Response Nurse Documentation Code Documentation  Ariel [name] is a 65 y.o. female arriving to [facility] ED via [facility] on 12/08/2020 with past medical hx of TIA, Melanoma,  . On No antithrombotic. Code stroke was activated by EMS.   Patient from home where she was LKW at [place] and now complaining of difficulty speaking.  She had conversation during breakfast with her caregiver.  She had eye surgery yesterday.  Stroke team at the bedside on patient arrival. Labs drawn and patient cleared for CT by EDP. Patient to CT with team. NIHSS 10, see documentation for details and code stroke times. Patient with disoriented, not following commands, right hemianopia, Global aphasia , and dysarthria  on exam. The following imaging was completed: CT, CTA head and neck, CTP. Patient is not a candidate for IV Thrombolytic due to stroke not suspected. Patient is not a candidate for IR due to no LVO.   Care/Plan: neuro check and VS  q 2 hours. MRI   Bedside handoff with ED RN Berkley Harvey.    Raliegh Ip  Stroke Response RN

## 2020-12-08 NOTE — H&P (Signed)
History and Physical    Ariel [name] ZOX:[id number] DOB: 01/30/1956 DOA: 12/08/2020  PCP: [name], NP Consultants:  neurology: Dr. Carles Collet Patient coming from: home via ems.  lives alone   Chief Complaint: concern for stroke. Sudden onset loss of speaking/following commands.   HPI: Ariel [name] is a 65 y.o. female with medical history significant of HLD, IDA, TIA in 09/2020, melanoma of the right hip 2021 stage IB and vitreous hemorrhage of the right eye s/p vitrectomy 12/07/20 who presented to ED for sudden onset nonverbal state and inability to follow commands.  History from her son.  He received a call around 7:30AM from a friend staying with his mom.  He got to her house before 8:00AM.  He states she was up on her feet, but had a blank stare, didn't speak and didn't know who  he was. He immediately called 911 at 8:07am. When [facility] arrived she still was nonverbal and wouldn't follow commands.   Monday night she did mention to her son that she had some night sweats, but no fever/chills.  No headaches, chest pain, palpitations, shortness of breath, cough, stomach pain, N/V/D, dysuria, leg swelling.     ED Course: Vitals: Afebrile, heart rate 94, respiratory rate 16, blood pressure 143/59, oxygen 100% on room air. Pertinent labs: Hemoglobin 10.4, platelets 425, sodium 133.  Code stroke was called.  CT head: Edema in left temporal and parietal lobes appears to spare the overlying cortex felt unlikely to be related to involving infarct and more concerning for underlying mass lesion given history of melanoma. CTA: Patent vasculature of the head and neck. MRI brain: 2 enhancing lesions in the left cerebral hemisphere, larger in the posterior temporal lobe concerning for metastatic disease.  Edema surrounding the lesions more so in the left temporal lobe, without midline shift. Neurology was consulted.  Given Decadron 10 mg in ED and started on Keppra 500 mg twice daily.  Allergy will consult  neurosurgery and neuro-oncology.  TRH was asked to admit  Review of Systems: As per HPI; otherwise review of systems reviewed and negative.   Ambulatory Status:  Ambulates with cane at times.    Past Medical History:  Diagnosis Date   Chicken pox    Complication of anesthesia    Per patient, very slow to wake up after anesthesia   High cholesterol    Hyperglycemia 11/10/2020   Melanoma ([place]) 2021   right hip   PONV (postoperative nausea and vomiting)    Urinary incontinence     Past Surgical History:  Procedure Laterality Date   AIR/FLUID EXCHANGE Right 12/07/2020   Procedure: AIR/FLUID EXCHANGE;  Surgeon: Jalene Mullet, MD;  Location: [facility];  Service: Ophthalmology;  Laterality: Right;   APPENDECTOMY  1962   EXCISION MELANOMA WITH SENTINEL LYMPH NODE BIOPSY Right 10/09/2019   Procedure: WIDE LOCAL EXCISION WITH ADVANCEMENT FLAP CLOSURE RIGHT HIP MELANOMA WITH SENTINEL LYMPH NODE BIOPSY;  Surgeon: Stark Klein, MD;  Location: [facility];  Service: General;  Laterality: Right;   HERNIA REPAIR  2009   MOUTH SURGERY  11/2015   gum surgery and bone implant   PARS PLANA VITRECTOMY Right 12/07/2020   Procedure: PARS PLANA VITRECTOMY WITH 25 GAUGE;  Surgeon: Jalene Mullet, MD;  Location: [facility];  Service: Ophthalmology;  Laterality: Right;   PHOTOCOAGULATION WITH LASER Right 12/07/2020   Procedure: PHOTOCOAGULATION WITH ENDOLASER PANRITINAL COAGULATION;  Surgeon: Jalene Mullet, MD;  Location: [facility];  Service: Ophthalmology;  Laterality: Right;   WISDOM TOOTH EXTRACTION  Social History   Socioeconomic History   Marital status: Widowed    Spouse name: Not on file   Number of children: Not on file   Years of education: Not on file   Highest education level: Not on file  Occupational History   Occupation: retired  Tobacco Use   Smoking status: Never   Smokeless tobacco: Never  Vaping Use   Vaping Use: Never used  Substance and Sexual Activity   Alcohol use: Never   Drug  use: Never   Sexual activity: Not Currently  Other Topics Concern   Not on file  Social History Narrative   Lives with husband   One Son- lives locally, 1 grand-daughter   Daughter- Sport and exercise psychologist. [place]   Completed Hotel manager school   Works as a Probation officer.         Social Determinants of Radio broadcast assistant Strain: Low Risk    Difficulty of Paying Living Expenses: Not hard at all  Food Insecurity: No Food Insecurity   Worried About Charity fundraiser in the Last Year: Never true   Arboriculturist in the Last Year: Never true  Transportation Needs: No Transportation Needs   Lack of Transportation (Medical): No   Lack of Transportation (Non-Medical): No  Physical Activity: Sufficiently Active   Days of Exercise per Week: 4 days   Minutes of Exercise per Session: 40 min  Stress: No Stress Concern Present   Feeling of Stress : Not at all  Social Connections: Moderately Isolated   Frequency of Communication with Friends and Family: More than three times a week   Frequency of Social Gatherings with Friends and Family: More than three times a week   Attends Religious Services: More than 4 times per year   Active Member of [employer] or Organizations: No   Attends Archivist Meetings: Never   Marital Status: Widowed  Human resources officer Violence: Not At Risk   Fear of Current or Ex-Partner: No   Emotionally Abused: No   Physically Abused: No   Sexually Abused: No    Allergies  Allergen Reactions   Codeine Nausea Only   Plavix [Clopidogrel]     Hives,lip swelling     Family History  Problem Relation Age of Onset   Coronary artery disease Father        died at 10   COPD Father    Alcohol abuse Father    Diabetes Mellitus II Father    Coronary artery disease Sister    Cancer Sister        breast   Heart attack Brother    Hyperlipidemia Other        died 77- "natural causes"   Hyperlipidemia Other        4 siblings    Hypertension Other        3 siblings     Prior to Admission medications   Medication Sig Start Date End Date Taking? Authorizing Provider  aspirin EC 81 MG tablet Take 81 mg by mouth daily. Swallow whole.    [provider]  betamethasone dipropionate 0.05 % cream Apply topically 2 (two) times daily. Patient not taking: Reported on 11/30/2020 11/10/20   [name], NP  Ferrous Sulfate (IRON) 325 (65 Fe) MG TABS Take 325 mg by mouth daily.    [provider]    Physical Exam: Vitals:   12/08/20 1849 12/08/20 1853 12/08/20 1854 12/08/20 2000  BP:  125/65  (!) 114/47  Pulse: 87  80 66  Resp: 18  15 14   Temp:    98.1 F (36.7 C)  TempSrc:    Oral  SpO2: 93%  94% 95%  Weight:      Height:         General:  Appears calm and comfortable and is in NAD Eyes:  PERRL, EOMI, normal lids, iris ENT:  grossly normal hearing, lips & tongue, mmm; appropriate dentition Neck:  no LAD, masses or thyromegaly; no carotid bruits Cardiovascular:  RRR, no m/r/g. No LE edema.  Respiratory:   CTA bilaterally with no wheezes/rales/rhonchi.  Normal respiratory effort. Abdomen:  soft, NT, ND, NABS Back:   normal alignment, no CVAT Skin:  no rash or induration seen on limited exam Musculoskeletal:  grossly normal tone BUE/BLE, good ROM, no bony abnormality Lower extremity:  No LE edema.  Limited foot exam with no ulcerations.  2+ distal pulses. Psychiatric:  severely aphasic speech.  Neurologic:  CN 2-12 grossly intact, moves all extremities in coordinated fashion, sensation intact    Radiological Exams on Admission: Independently reviewed - see discussion in A/P where applicable  MR BRAIN W WO CONTRAST  Result Date: 12/08/2020 CLINICAL DATA:  History of melanoma, evaluate for metastatic disease EXAM: MRI HEAD WITHOUT AND WITH CONTRAST TECHNIQUE: Multiplanar, multiecho pulse sequences of the brain and surrounding structures were obtained without and with intravenous contrast. CONTRAST:  53mL GADAVIST GADOBUTROL 1  MMOL/ML IV SOLN COMPARISON:  Same-day noncontrast CT head, brain MRI 10/07/2020 FINDINGS: Brain: There is an enhancing lesion in the left posterior temporal lobe measuring 1.9 cm by 1.9 cm x 2.2 cm consistent with metastatic disease. There is surrounding vasogenic edema resulting in effacement of the adjacent sulci and slight effacement of the occipital horn of the left lateral ventricle. There is an additional enhancing lesion in the left superior parietal lobule measuring approximately 0.9 cm TV by 1.3 cm cc in the coronal plane. The lesion is not well evaluated in the axial plane due to motion artifact. There is mild vasogenic edema surrounding this lesion without significant mass effect. No other enhancing lesions are seen. There is no evidence of acute intracranial hemorrhage or infarct. A small focus of susceptibility in the left frontal lobe is nonspecific. There is no midline shift. Vascular: Normal flow voids. Skull and upper cervical spine: There is no definite abnormal signal abnormality, though the upper cervical spine is not well evaluated due to significant motion artifact. Sinuses/Orbits: The paranasal sinuses are clear. T2 hypointensity in the right globe is consistent with tear related to a prior procedure as seen on prior CT. Other: None. IMPRESSION: Two enhancing lesions in the left cerebral hemisphere, the larger in the posterior temporal lobe measuring up to 1.9 cm x 1.9 cm x 2.2 cm most concerning for metastatic disease given the patient's history and multiplicity. There is edema surrounding the lesions, more so in the left temporal lobe, without midline shift. Electronically Signed   By: Valetta Mole M.D.   On: 12/08/2020 12:09   CT HEAD CODE STROKE WO CONTRAST  Result Date: 12/08/2020 CLINICAL DATA:  Code stroke. EXAM: CT HEAD WITHOUT CONTRAST TECHNIQUE: Contiguous axial images were obtained from the base of the skull through the vertex without intravenous contrast. COMPARISON:  Brain  MRI 10/07/2020 FINDINGS: Brain: There is edema in the left temporal lobe posteriorly which appears to spare the overlying gray matter. There is effacement of the surrounding sulci and partial effacement of the occipital horn of the left  lateral ventricle. There is dish in all hypodensity in the left superior parietal lobule with sparing of the overlying cortex (5-49). There is no evidence of acute intracranial hemorrhage. There is no extra-axial fluid collection. There is no midline shift. Vascular: There is mild calcification of the intracranial ICAs. There is no dense vessel. Skull: Normal. Negative for fracture or focal lesion. Sinuses/Orbits: There is air in the right globe which is new since the prior study, likely postprocedural. The imaged paranasal sinuses are clear. Other: None. ASPECTS [facility] Stroke Program Early CT Score) - Ganglionic level infarction (caudate, lentiform nuclei, internal capsule, insula, M1-M3 cortex): 7 - Supraganglionic infarction (M4-M6 cortex): 3 Total score (0-10 with 10 being normal): 10 IMPRESSION: 1. Edema in the left temporal and parietal lobes which appears to spare the overlying cortex is felt unlikely to be related to evolving infarct, and is more concerning for underlying mass lesion given the history of mono melanoma. Recommend MRI of the brain with and without contrast for further evaluation. 2. ASPECTS is 10 3. Air in the right globe is likely postprocedural. These results were called by telephone at the time of interpretation on 12/08/2020 at 9:16 am to provider Dr Theda Sers , who verbally acknowledged these results. Electronically Signed   By: Valetta Mole M.D.   On: 12/08/2020 09:17   CT ANGIO HEAD NECK W WO CM W PERF (CODE STROKE)  Result Date: 12/08/2020 EXAM: CT ANGIOGRAPHY HEAD AND NECK CT PERFUSION BRAIN TECHNIQUE: Multidetector CT imaging of the head and neck was performed using the standard protocol during bolus administration of intravenous contrast.  Multiplanar CT image reconstructions and MIPs were obtained to evaluate the vascular anatomy. Carotid stenosis measurements (when applicable) are obtained utilizing NASCET criteria, using the distal internal carotid diameter as the denominator. Multiphase CT imaging of the brain was performed following IV bolus contrast injection. Subsequent parametric perfusion maps were calculated using RAPID software. CONTRAST:  170mL OMNIPAQUE IOHEXOL 350 MG/ML SOLN COMPARISON:  Same day CT head, brain MRI 10/07/2020, CTA head/neck 10/06/2020 FINDINGS: CTA NECK FINDINGS Aortic arch: Standard branching. Imaged portion shows no evidence of aneurysm or dissection. No significant stenosis of the major arch vessel origins. Right carotid system: No evidence of dissection, stenosis (50% or greater) or occlusion. Left carotid system: No evidence of dissection, stenosis (50% or greater) or occlusion. Vertebral arteries: Codominant. No evidence of dissection, stenosis (50% or greater) or occlusion. Skeleton: There is mild multilevel degenerative change of the cervical spine, most advanced at C5-C6. There is no suspicious osseous lesion. Other neck: The soft tissues are unremarkable. There is no pathologic lymphadenopathy in the neck. Upper chest: The lung apices are clear. Review of the MIP images confirms the above findings CTA HEAD FINDINGS Anterior circulation: There is mild calcification of the bilateral intracranial ICAs without significant stenosis or occlusion. The bilateral MCAs are patent. The bilateral ACAs are patent. There is no aneurysm. Posterior circulation: The bilateral V4 segments are patent. The basilar artery is patent. The bilateral PCAs are patent. There is no aneurysm. Venous sinuses: Patent. Anatomic variants: There is a prominent posterior communicating artery on the right. Review of the MIP images confirms the above findings CT Brain Perfusion Findings: CBF (<30%) Volume: 72mL Perfusion (Tmax>6.0s) volume: 74mL  Mismatch Volume: N/a Infarction Location:N/a IMPRESSION: 1. Patent vasculature of the head and neck with no significant stenosis or occlusion. 2. No infarct core or penumbra identified. Electronically Signed   By: Valetta Mole M.D.   On: 12/08/2020 09:26  EKG: Independently reviewed.  NSR with rate 96; nonspecific ST changes with no evidence of acute ischemia   Labs on Admission: I have personally reviewed the available labs and imaging studies at the time of the admission.  Pertinent labs:  Hemoglobin 10.4,  platelets 425,  sodium 133.    Assessment/Plan Principal Problem:   Brain tumor  65 year old presenting with acute aphasia found to have metastatic brain disease to her left temporal and parietal lobes likely secondary to melanoma -Neurology, neurosurgery and neuro-oncology have been consulted and appreciate assistance -Decadron and Keppra per neurology -Follow-up on neurosurgery and neuro-oncology recommendation -Holding anticoagulation in setting of recent eye surgery x24 hours -Seizure precautions  Active Problems:   Aphasia -secondary  to #1 speech has been consulted to eval and treat    Hyponatremia -stable (132-133) -Likely secondary to malignancy -We will work-up for SIADH    Melanoma -diagnosed as stage I in 2021 from right hip. Did not require any adjuvant therapy.  -Followed outpatient by oncology Dr. Marin Olp  Right eye vitreous hemorrhage  S/p vitrectomy with endolaser on 12/08/10 Spoke with Dr. Posey Pronto and made him aware she is here Removed eye patch. Can not lay flat. Hold anticoagulation x 24 hours    Hyperlipidemia -on no statin -LDL 83 on 09/2020     Iron deficiency anemia -Followed outpatient by oncology, Dr. Marin Olp with work up and recent visit.  -per OV note, plans for IV iron    TIA Hold ASA x 24 hours s/p eye surgery. Can resume tomorrow if no intervention by neurosurgery On no statin  Body mass index is 23.06 kg/m.   Level of care:  Med-Surg DVT prophylaxis:  SCDs due to recent eye surgery. Hold x 24 hours  Code Status:  Full - confirmed with patient/family Family Communication: son at bedside:  Mali Sakata  Disposition Plan:  The patient is from: home  Anticipated d/c is to: home   Requires inpatient hospitalization and is at significant risk of neurological worsening, requires constant monitoring, assessment and MDM with specialists.   Patient is currently: stable  Consults called: neurology by edp. Neurosurgery and neuro-oncology  Admission status:  inpatient   Dragon dictation used in completing this note.    Orma Flaming MD [facility]   How to contact the [facility] Attending or Consulting provider [place] or covering provider during after hours [place], for this patient?  Check the care team in [facility] and look for a) attending/consulting [facility] provider listed and b) the [facility] team listed Log into [email or web address] and use [facility]'s universal password to access. If you do not have the password, please contact the hospital operator. Locate the [facility] provider you are looking for under [facility] and page to a number that you can be directly reached. If you still have difficulty reaching the provider, please page the [facility] (Director on Call) for the Hospitalists listed on amion for assistance.   12/08/2020, 8:33 PM

## 2020-12-08 NOTE — Consult Note (Signed)
Neurology Consultation  Reason for Consult: Patient not following commands, speech disturbance Referring Physician: Dr. Gilford Raid  CC: Patient is unable to share cc due to decreased verbalization  History is obtained from: EMS, Chart review, Patient's son at bedside  HPI: Ariel [name] is a 65 y.o. female with a medical history significant for hyperlipidemia, melanoma of the right hip 2021, hyperglycemia, reported TIA in August of 2022, and a vitreous hemorrhage of the right eye s/p vitrectomy with endolaser yesterday (10/11) who presented to the ED 10/12 via EMS for evaluation of sudden onset decreased verbalization and inability to follow commands. Following her procedure yesterday, she was at her baseline and was able to have dinner with her son and was conversing normally. She had a friend stay with her overnight to monitor her and woke up this morning as usual and had breakfast. Her friend reported that she was conversant and at her baseline mental status during breakfast but briefly following, she was witnessed with a blank stare, decreased verbalization, and was unable to follow commands. EMS was activated at this time. Per patient's son, these are the same symptoms she had displayed when she was brought in August 2022 with resolution over 5-6 hours and discharged with a diagnosis of a TIA. He states that the only difference between her presentation today is that she had a blank stare today and decreased verbalization and that in August, she was speaking gibberish but was talkative. He does state that she has not quite been herself since she was hospitalized in August and that she has been fatigued with decreased energy but that she has also been working with outpatient providers for evaluation of low iron and hemoglobin identified on labs. On chart review, she was noted to have a hemoglobin of 10.5 on 8/10 and repeat hemoglobin on 10/4 was 8.6. At this time, her stool was checked and was heme  negative. Labs from 10/5 are significant for low iron, low TIBC, low saturation ratios, and high ferritin.   LKW: 07:30 TNK given?: no, imaging reviewed with evidence of edema in the left temporal and parietal lobes sparing the overlying cortex that is felt to be unlikely secondary to acute ischemia.  IR Thrombectomy? No, angiography imaging reviewed by attending neurologist and radiologist without LVO, significant stenosis, or occlusion.  Modified Rankin Scale: 0-Completely asymptomatic and back to baseline post- stroke  ROS: Unable to obtain due to altered mental status.   Past Medical History:  Diagnosis Date   Chicken pox    Complication of anesthesia    Per patient, very slow to wake up after anesthesia   High cholesterol    Hyperglycemia 11/10/2020   Melanoma ([place]) 2021   right hip   PONV (postoperative nausea and vomiting)    Urinary incontinence    Past Surgical History:  Procedure Laterality Date   AIR/FLUID EXCHANGE Right 12/07/2020   Procedure: AIR/FLUID EXCHANGE;  Surgeon: Jalene Mullet, MD;  Location: [place];  Service: Ophthalmology;  Laterality: Right;   APPENDECTOMY  1962   EXCISION MELANOMA WITH SENTINEL LYMPH NODE BIOPSY Right 10/09/2019   Procedure: WIDE LOCAL EXCISION WITH ADVANCEMENT FLAP CLOSURE RIGHT HIP MELANOMA WITH SENTINEL LYMPH NODE BIOPSY;  Surgeon: Stark Klein, MD;  Location: [place];  Service: General;  Laterality: Right;   HERNIA REPAIR  2009   MOUTH SURGERY  11/2015   gum surgery and bone implant   PARS PLANA VITRECTOMY Right 12/07/2020   Procedure: PARS PLANA VITRECTOMY WITH 25 GAUGE;  Surgeon: Jalene Mullet,  MD;  Location: [place];  Service: Ophthalmology;  Laterality: Right;   PHOTOCOAGULATION WITH LASER Right 12/07/2020   Procedure: PHOTOCOAGULATION WITH ENDOLASER PANRITINAL COAGULATION;  Surgeon: Jalene Mullet, MD;  Location: [place];  Service: Ophthalmology;  Laterality: Right;   WISDOM TOOTH EXTRACTION     Family History  Problem Relation  Age of Onset   Coronary artery disease Father        died at 66   COPD Father    Alcohol abuse Father    Diabetes Mellitus II Father    Coronary artery disease Sister    Cancer Sister        breast   Heart attack Brother    Hyperlipidemia Other        died 34- "natural causes"   Hyperlipidemia Other        4 siblings    Hypertension Other        3 siblings   Social History:   reports that she has never smoked. She has never used smokeless tobacco. She reports that she does not drink alcohol and does not use drugs.  Medications No current facility-administered medications for this encounter.  Current Outpatient Medications:    aspirin EC 81 MG tablet, Take 81 mg by mouth daily. Swallow whole., Disp: , Rfl:    betamethasone dipropionate 0.05 % cream, Apply topically 2 (two) times daily. (Patient not taking: Reported on 11/30/2020), Disp: 30 g, Rfl: 1   Ferrous Sulfate (IRON) 325 (65 Fe) MG TABS, Take 325 mg by mouth daily., Disp: , Rfl:   Exam: Current vital signs: BP (!) 143/59 (BP Location: Right Arm)   Pulse 94   Temp 97.8 F (36.6 C) (Oral)   Resp 16   Wt 64.8 kg   SpO2 100%   BMI 23.06 kg/m  Vital signs in last 24 hours: Temp:  [97.5 F (36.4 C)-97.9 F (36.6 C)] 97.8 F (36.6 C) (10/12 0922) Pulse Rate:  [74-94] 94 (10/12 0922) Resp:  [11-20] 16 (10/12 0922) BP: (137-143)/(46-71) 143/59 (10/12 0927) SpO2:  [97 %-100 %] 100 % (10/12 0927) Weight:  [64.4 kg-64.8 kg] 64.8 kg (10/12 0800)  GENERAL: Awake, alert, in no acute distress Psych: Patient is calm throughout examination though she is unable to actively participate. She does become restless intermittently  Head: Normocephalic and atraumatic, without obvious abnormality EENT: Normal conjunctivae, dry mucous membranes, no OP obstruction LUNGS: Normal respiratory effort. Non-labored breathing on room air. CV: Regular rate and rhythm on telemetry ABDOMEN: Soft, non-tender, non-distended Extremities: warm,  well perfused, without obvious deformity  NEURO:  Mental Status: Awake and alert.  She is unable to provide a clear or coherent history of present illness.  Her verbalization is significantly decreased with very minimal speech output. She states "yeah" and "exactly" to every examiner question. Receptive aphasia is present.  She does not follow commands.  She is unable to name objects or repeat phrases.  No obvious neglect is noted Cranial Nerves:  II: Left eye is 45mm, regular, round, and briskly reactive to light. Unable to assess right eye due to dressing in place from procedure yesterday. III, IV, VI: EOMI on the left without ptosis. Unable to assess right eye.  V: Blink to threat present throughout on the left, unable to assess right eye due to dressing in place 2/2 previous surgery 10/11. VII: Face is symmetric resting and with movement.  VIII: Hearing is intact to voice IX, X: Phonation normal, unable to assess soft palate XI: Normal sternocleidomastoid  and trapezius muscle strength XII: Does not protrude tongue to command   Motor: 5/5 strength is all muscle groups without vertical drift with constant coaching.   Tone is normal. Bulk is normal.  Sensation: localizes with bilateral upper extremities and grabs at peripheral IV's and monitoring devices with bilateral upper extremities. Withdraws to tickle briskly and equally of bilateral lower extremities. Coordination: Unable to assess, patient does not follow commands  DTRs: 2+ and symmetric patellae, 3+ biceps  Plantars: Toes downgoing bilaterally  Gait: Deferred  NIHSS: 1a Level of Conscious.: 0 1b LOC Questions: 2 1c LOC Commands: 2 2 Best Gaze: 0 3 Visual: 2 4 Facial Palsy: 0 5a Motor Arm - left: 0 5b Motor Arm - Right: 0 6a Motor Leg - Left: 0 6b Motor Leg - Right: 0 7 Limb Ataxia: 0 8 Sensory: 0 9 Best Language: 2 10 Dysarthria: 2 11 Extinct. and Inatten.: 0 TOTAL: 10  Labs I have reviewed labs in epic and the  results pertinent to this consultation are: CBC    Component Value Date/Time   WBC 10.0 12/08/2020 0855   RBC 4.40 12/08/2020 0855   HGB 11.6 (L) 12/08/2020 0857   HGB 9.0 (L) 12/01/2020 1537   HCT 34.0 (L) 12/08/2020 0857   PLT 425 (H) 12/08/2020 0855   PLT 427 (H) 12/01/2020 1537   MCV 78.9 (L) 12/08/2020 0855   MCH 23.6 (L) 12/08/2020 0855   MCHC 30.0 12/08/2020 0855   RDW 17.6 (H) 12/08/2020 0855   LYMPHSABS 1.6 12/08/2020 0855   MONOABS 0.8 12/08/2020 0855   EOSABS 0.0 12/08/2020 0855   BASOSABS 0.0 12/08/2020 0855   CMP     Component Value Date/Time   NA 135 12/08/2020 0857   K 4.2 12/08/2020 0857   CL 98 12/08/2020 0857   CO2 30 12/01/2020 1537   GLUCOSE 129 (H) 12/08/2020 0857   BUN 12 12/08/2020 0857   CREATININE 0.50 12/08/2020 0857   CREATININE 0.59 12/01/2020 1537   CREATININE 0.68 11/18/2012 1439   CALCIUM 9.8 12/01/2020 1537   PROT 8.0 12/01/2020 1537   ALBUMIN 3.5 12/01/2020 1537   AST 30 12/01/2020 1537   ALT 37 12/01/2020 1537   ALKPHOS 84 12/01/2020 1537   BILITOT 0.3 12/01/2020 1537   GFRNONAA >60 12/01/2020 1537   GFRNONAA >89 11/18/2012 1439   GFRAA >60 09/30/2019 1050   GFRAA >89 11/18/2012 1439   Lipid Panel     Component Value Date/Time   CHOL 134 10/07/2020 0332   TRIG 36 10/07/2020 0332   HDL 44 10/07/2020 0332   CHOLHDL 3.0 10/07/2020 0332   VLDL 7 10/07/2020 0332   LDLCALC 83 10/07/2020 0332   Lab Results  Component Value Date   HGBA1C 6.0 (H) 10/07/2020   Imaging I have reviewed the images obtained:  CT-scan of the brain 10/12: 1. Edema in the left temporal and parietal lobes which appears to spare the overlying cortex is felt unlikely to be related to evolving infarct, and is more concerning for underlying mass lesion given the history of mono melanoma. Recommend MRI of the brain with and without contrast for further evaluation. 2. ASPECTS is 10 3. Air in the right globe is likely postprocedural  CT angio head and neck  10/12: 1. Patent vasculature of the head and neck with no significant stenosis or occlusion. 2. No infarct core or penumbra identified.  MRI brain with and without contrast 10/12: Two enhancing lesions in the left cerebral hemisphere, the larger in  the posterior temporal lobe measuring up to 1.9 cm x 1.9 cm x 2.2 cm most concerning for metastatic disease given the patient's history and multiplicity. There is edema surrounding the lesions, more so in the left temporal lobe, without midline shift.  Assessment: 65 y.o. female with history as above who presented 10/12 via EMS for evaluation of acute onset of decreased verbalization, blank stare, and inability to follow commands.  - Examination reveals patient with receptive aphasia, does not follow commands, and with minimal verbalization with the words "yeah" and "exactly". Unable to assess her right eye due to patch in place from procedure 10/11. NIHSS of 10. - CT imaging reveals edema in the left temporal and parietal lobes that is felt unlikely to be related to evolving infarct and is more concerning for an underlying mass lesion with history of melanoma. MRI brain with and without contrast pending for further evaluation of potential lesion. MRI brain shows two enhancing lesions in the left cerebral hemisphere with edema, more so in the left temporal lobe, without midline shift.  - Patient's presentation is felt to be most consistent with CNS involvement of melanoma with edema in the left temporal and parietal lobes. There is low suspicion for acute infarction on presentation.   Recommendations: - MRI brain with and without contrast with 1 mg IV lorazepam for MRI tolerance as patient cannot be redirected - Dexamethasone 10 mg IV once followed by 6 mg IV daily after MRI to manage edema - Appreciate neurosurgery consultation with recommendations - Have contacted Dr. Mickeal Skinner with Neuro Oncology, appreciate recommendations - Will initiate seizure  prophylaxis: Keppra 500 mg PO BID - Inpatient seizure precautions  Pt seen by NP/Neuro and later by MD. Note/plan to be edited by MD as needed.  Anibal Henderson, AGAC-NP [facility] Pager: [phone]  Attending Attestation:  Patient seen, examined, labs,vitals and notes reviewed. Discussed plan with Anibal Henderson, PA and agree with assessment and plan as documented above. I have independently reviewed the chart, obtained history, review of systems and examined the patient.  Electronically signed by:  Lynnae Sandhoff, MD Page: [phone] 12/08/2020, 7:14 PM

## 2020-12-08 NOTE — Evaluation (Signed)
Clinical/Bedside Swallow Evaluation Patient Details  Name: Ariel [name] MRN: [id number] Date of Birth: May 28, 1955  Today's Date: 12/08/2020 Time: SLP Start Time (ACUTE ONLY): 0255 SLP Stop Time (ACUTE ONLY): 0317 SLP Time Calculation (min) (ACUTE ONLY): 22 min  Past Medical History:  Past Medical History:  Diagnosis Date   Chicken pox    Complication of anesthesia    Per patient, very slow to wake up after anesthesia   High cholesterol    Hyperglycemia 11/10/2020   Melanoma ([place]) 2021   right hip   PONV (postoperative nausea and vomiting)    Urinary incontinence    Past Surgical History:  Past Surgical History:  Procedure Laterality Date   AIR/FLUID EXCHANGE Right 12/07/2020   Procedure: AIR/FLUID EXCHANGE;  Surgeon: Jalene Mullet, MD;  Location: [place];  Service: Ophthalmology;  Laterality: Right;   APPENDECTOMY  1962   EXCISION MELANOMA WITH SENTINEL LYMPH NODE BIOPSY Right 10/09/2019   Procedure: WIDE LOCAL EXCISION WITH ADVANCEMENT FLAP CLOSURE RIGHT HIP MELANOMA WITH SENTINEL LYMPH NODE BIOPSY;  Surgeon: Stark Klein, MD;  Location: [place];  Service: General;  Laterality: Right;   HERNIA REPAIR  2009   MOUTH SURGERY  11/2015   gum surgery and bone implant   PARS PLANA VITRECTOMY Right 12/07/2020   Procedure: PARS PLANA VITRECTOMY WITH 25 GAUGE;  Surgeon: Jalene Mullet, MD;  Location: [place];  Service: Ophthalmology;  Laterality: Right;   PHOTOCOAGULATION WITH LASER Right 12/07/2020   Procedure: PHOTOCOAGULATION WITH ENDOLASER PANRITINAL COAGULATION;  Surgeon: Jalene Mullet, MD;  Location: [place];  Service: Ophthalmology;  Laterality: Right;   WISDOM TOOTH EXTRACTION     HPI:  65 y.o. F with medical history significant of HLD, IDA, TIA in 09/2020, melanoma of the right hip 2021 stage IB and vitreous hemorrhage of the right eye s/p vitrectomy 12/07/20 who presented to ED for sudden onset nonverbal state and inability to follow commands. Imaging revealed two enhancing lesions  in the left posterior temporal lobe and left superior parietal lobule with mild vasogenic edema.    Assessment / Plan / Recommendation  Clinical Impression  Pt was seen for a bedside swallow evaluation. Pt on regular/thin liquid diet and pt and son report no difficulty with puree or sandwich at mealtime. Oral mechanism exam unremarkable, although strength difficult to determine d/t impaired receptive language/cognition. SLP trialed thin liquid and regular solids, observing no oral phase impairments and no overt s/s of penetration or aspiration. Recommend regular/thin liquid diet administering medications whole with liquid. Pt does not need SLP swallowing services at this time. Will request order for speech-language-cognition. SLP Visit Diagnosis: Dysphagia, unspecified (R13.10)    Aspiration Risk  No limitations    Diet Recommendation Regular;Thin liquid   Liquid Administration via: Cup;Straw Medication Administration: Whole meds with liquid Supervision: Patient able to self feed Compensations: Minimize environmental distractions Postural Changes: Seated upright at 90 degrees;Remain upright for at least 30 minutes after po intake    Other  Recommendations Oral Care Recommendations: Oral care BID    Recommendations for follow up therapy are one component of a multi-disciplinary discharge planning process, led by the attending physician.  Recommendations may be updated based on patient status, additional functional criteria and insurance authorization.  Follow up Recommendations None      Frequency and Duration            Prognosis        Swallow Study   General Date of Onset: 12/08/20 HPI: 65 y.o. F with medical history  significant of HLD, IDA, TIA in 09/2020, melanoma of the right hip 2021 stage IB and vitreous hemorrhage of the right eye s/p vitrectomy 12/07/20 who presented to ED for sudden onset nonverbal state and inability to follow commands. Imaging revealed two enhancing  lesions in the left posterior temporal lobe and left superior parietal lobule with mild vasogenic edema. Type of Study: Bedside Swallow Evaluation Previous Swallow Assessment: n/a Diet Prior to this Study: Regular;Thin liquids Temperature Spikes Noted: No Respiratory Status: Room air History of Recent Intubation: No Behavior/Cognition: Alert;Cooperative;Pleasant mood;Confused;Requires cueing Oral Cavity Assessment: Within Functional Limits Oral Care Completed by SLP: No Oral Cavity - Dentition: Adequate natural dentition Vision: Functional for self-feeding Self-Feeding Abilities: Able to feed self Patient Positioning: Upright in bed Baseline Vocal Quality: Normal Volitional Cough: Weak Volitional Swallow: Able to elicit    Oral/Motor/Sensory Function Overall Oral Motor/Sensory Function: Within functional limits   Ice Chips Ice chips: Not tested   Thin Liquid Thin Liquid: Within functional limits Presentation: Cup    Nectar Thick Nectar Thick Liquid: Not tested   Honey Thick Honey Thick Liquid: Not tested   Puree Puree: Within functional limits Presentation: Spoon;Self Fed   Solid    Ariel [name], SLP-Student  Solid: Within functional limits Presentation: Self Fed      Ariel [name] 12/08/2020,4:05 PM

## 2020-12-08 NOTE — ED Notes (Signed)
The pt just had a detached retina with clot removed from the rt eye approx 24 hours ago she appears to be hurting in that eye shes keeping t closed and she rubs it

## 2020-12-08 NOTE — Op Note (Signed)
Ariel [name] 12/08/2020 Diagnosis: Vitreous hemorrhage right eye  Procedure: [place] Vitrectomy, Endolaser, and partial air/fluid exchange Operative Eye:  right eye  Surgeon: Royston Cowper Estimated Blood Loss: minimal Specimens for Pathology:  None Complications: none   The  patient was prepped and draped in the usual fashion for ocular surgery on the  right eye .  A lid speculum was placed.  Infusion line and trocar was placed at the 8 o'clock position approximately 3.5 mm from the surgical limbus.   The infusion line was allowed to run and then clamped when placed at the cannula opening. The line was inserted and secured to the drape with an adhesive strip.   Active trocars/cannula were placed at the 10 and 2 o'clock positions approximately 3.5 mm from the surgical limbus. The cannula was visualized in the vitreous cavity.  The light pipe and vitreous cutter were inserted into the vitreous cavity and a core vitrectomy was performed.  Care taken to remove the vitreous up to the vitreous base for 360 degrees.   3 rows of endolaser were applied 360 degrees to the periphery.  A partial air-fluid exchange was performed.  The superior cannulas were sequentially removed with concommitant tamponade using a cotton tipped applicator and noted to be air tight.  The infusion line and trocar were removed and the sclerotomy was noted to be air tight with normal intraocular pressure by digital palpapation.  Subconjunctival injections of  Dexamethasone 4mg /84ml was placed in the infero-medial quadrant.   The speculum and drapes were removed and the eye was patched with Polymixin/Bacitracin ophthalmic ointment. An eye shield was placed and the patient was transferred alert and conversant with stable vital signs to the post operative recovery area.  The patient tolerated the procedure well and no complications were noted.  Royston Cowper MD

## 2020-12-08 NOTE — ED Provider Notes (Signed)
[facility] EMERGENCY DEPARTMENT Provider Note   CSN: [id number] Arrival date & time: 12/08/20  6195  An emergency department physician performed an initial assessment on this suspected stroke patient at 29.  History Chief Complaint  Patient presents with   Code Stroke    Ariel [name] is a 65 y.o. female.  Pt presents to the ED today as a code stroke.  Pt is unable to give any hx due to aphasia.  Hx obtained by EMS.  Per EMS, she was normal this morning at 0730.  She got up and ate breakfast and was normal.  Shortly after breakfast, she was witnessed having a blank stare and decreased verbalization.  EMS was then called.   She did have a vitreous hemorrhage lasered by Dr. Posey Pronto yesterday.  She has a hx of TIA in August that caused aphasia.  She has seen neurology and cards.       Past Medical History:  Diagnosis Date   Chicken pox    Complication of anesthesia    Per patient, very slow to wake up after anesthesia   High cholesterol    Hyperglycemia 11/10/2020   Melanoma ([place]) 2021   right hip   PONV (postoperative nausea and vomiting)    Urinary incontinence     Patient Active Problem List   Diagnosis Date Noted   Brain tumor ([place]) 12/08/2020   Night sweat 11/30/2020   Chicken pox 09/32/6712   Complication of anesthesia 11/22/2020   PONV (postoperative nausea and vomiting) 11/22/2020   Skin rash 11/10/2020   Tachycardia 11/10/2020   Anemia 11/10/2020   Hyperglycemia 11/10/2020   TIA (transient ischemic attack) 10/07/2020   Iron deficiency anemia 10/07/2020   Aphasia 10/06/2020   Melanoma ([place]) 2021   Multinodular goiter 12/22/2013   Subclinical hyperthyroidism 12/19/2013   Urinary incontinence 11/19/2013   Hyperlipidemia 11/19/2012   Routine general medical examination at a health care facility 11/18/2012    Past Surgical History:  Procedure Laterality Date   AIR/FLUID EXCHANGE Right 12/07/2020   Procedure: AIR/FLUID EXCHANGE;   Surgeon: Jalene Mullet, MD;  Location: [facility];  Service: Ophthalmology;  Laterality: Right;   APPENDECTOMY  1962   EXCISION MELANOMA WITH SENTINEL LYMPH NODE BIOPSY Right 10/09/2019   Procedure: WIDE LOCAL EXCISION WITH ADVANCEMENT FLAP CLOSURE RIGHT HIP MELANOMA WITH SENTINEL LYMPH NODE BIOPSY;  Surgeon: Stark Klein, MD;  Location: [facility];  Service: General;  Laterality: Right;   HERNIA REPAIR  2009   MOUTH SURGERY  11/2015   gum surgery and bone implant   PARS PLANA VITRECTOMY Right 12/07/2020   Procedure: PARS PLANA VITRECTOMY WITH 25 GAUGE;  Surgeon: Jalene Mullet, MD;  Location: [facility];  Service: Ophthalmology;  Laterality: Right;   PHOTOCOAGULATION WITH LASER Right 12/07/2020   Procedure: PHOTOCOAGULATION WITH ENDOLASER PANRITINAL COAGULATION;  Surgeon: Jalene Mullet, MD;  Location: [facility];  Service: Ophthalmology;  Laterality: Right;   WISDOM TOOTH EXTRACTION       OB History     Gravida  2   Para  2   Term      Preterm      AB      Living  2      SAB      IAB      Ectopic      Multiple      Live Births              Family History  Problem Relation Age of Onset  Coronary artery disease Father        died at 70   COPD Father    Alcohol abuse Father    Diabetes Mellitus II Father    Coronary artery disease Sister    Cancer Sister        breast   Heart attack Brother    Hyperlipidemia Other        died 79- "natural causes"   Hyperlipidemia Other        4 siblings    Hypertension Other        3 siblings    Social History   Tobacco Use   Smoking status: Never   Smokeless tobacco: Never  Vaping Use   Vaping Use: Never used  Substance Use Topics   Alcohol use: Never   Drug use: Never    Home Medications Prior to Admission medications   Medication Sig Start Date End Date Taking? Authorizing Provider  aspirin EC 81 MG tablet Take 81 mg by mouth daily. Swallow whole.    [provider]  betamethasone dipropionate 0.05 % cream  Apply topically 2 (two) times daily. Patient not taking: Reported on 11/30/2020 11/10/20   [name], NP  Ferrous Sulfate (IRON) 325 (65 Fe) MG TABS Take 325 mg by mouth daily.    [provider]    Allergies    Codeine and Plavix [clopidogrel]  Review of Systems   Review of Systems  Unable to perform ROS: Patient nonverbal  All other systems reviewed and are negative.  Physical Exam Updated Vital Signs BP (!) 143/74 (BP Location: Right Arm)   Pulse 86   Temp 97.8 F (36.6 C) (Oral)   Resp 16   Ht 5' 6"  (1.676 m)   Wt 64.8 kg   SpO2 100%   BMI 23.06 kg/m   Physical Exam Vitals and nursing note reviewed.  HENT:     Head: Normocephalic and atraumatic.     Right Ear: External ear normal.     Left Ear: External ear normal.     Nose: Nose normal.     Mouth/Throat:     Mouth: Mucous membranes are moist.     Pharynx: Oropharynx is clear.  Eyes:     Comments: Right eye with dressing and shield  Cardiovascular:     Rate and Rhythm: Normal rate and regular rhythm.     Pulses: Normal pulses.     Heart sounds: Normal heart sounds.  Pulmonary:     Effort: Pulmonary effort is normal.     Breath sounds: Normal breath sounds.  Abdominal:     General: Abdomen is flat. Bowel sounds are normal.     Palpations: Abdomen is soft.  Musculoskeletal:     Cervical back: Normal range of motion and neck supple.  Skin:    General: Skin is warm.     Capillary Refill: Capillary refill takes less than 2 seconds.  Neurological:     Mental Status: She is alert.     Comments: Aphasia  She does not follow commands.    She is moving all 4 extremities.    ED Results / Procedures / Treatments   Labs (all labs ordered are listed, but only abnormal results are displayed) Labs Reviewed  CBC - Abnormal; Notable for the following components:      Result Value   Hemoglobin 10.4 (*)    HCT 34.7 (*)    MCV 78.9 (*)    MCH 23.6 (*)    RDW  17.6 (*)    Platelets 425 (*)     All other components within normal limits  COMPREHENSIVE METABOLIC PANEL - Abnormal; Notable for the following components:   Sodium 133 (*)    Glucose, Bld 131 (*)    Albumin 2.8 (*)    All other components within normal limits  I-STAT CHEM 8, ED - Abnormal; Notable for the following components:   Glucose, Bld 129 (*)    Hemoglobin 11.6 (*)    HCT 34.0 (*)    All other components within normal limits  CBG MONITORING, ED - Abnormal; Notable for the following components:   Glucose-Capillary 125 (*)    All other components within normal limits  RESP PANEL BY RT-PCR (FLU A&B, COVID) ARPGX2  PROTIME-INR  APTT  DIFFERENTIAL    EKG EKG Interpretation  Date/Time:  Wednesday December 08 2020 09:16:56 EDT Ventricular Rate:  96 PR Interval:  176 QRS Duration: 83 QT Interval:  368 QTC Calculation: 465 R Axis:   79 Text Interpretation: Sinus rhythm Right atrial enlargement Minimal ST depression, diffuse leads No significant change since last tracing Confirmed by Isla Pence [phone]) on 12/08/2020 9:28:02 AM  Radiology MR BRAIN W WO CONTRAST  Result Date: 12/08/2020 CLINICAL DATA:  History of melanoma, evaluate for metastatic disease EXAM: MRI HEAD WITHOUT AND WITH CONTRAST TECHNIQUE: Multiplanar, multiecho pulse sequences of the brain and surrounding structures were obtained without and with intravenous contrast. CONTRAST:  22m GADAVIST GADOBUTROL 1 MMOL/ML IV SOLN COMPARISON:  Same-day noncontrast CT head, brain MRI 10/07/2020 FINDINGS: Brain: There is an enhancing lesion in the left posterior temporal lobe measuring 1.9 cm by 1.9 cm x 2.2 cm consistent with metastatic disease. There is surrounding vasogenic edema resulting in effacement of the adjacent sulci and slight effacement of the occipital horn of the left lateral ventricle. There is an additional enhancing lesion in the left superior parietal lobule measuring approximately 0.9 cm TV by 1.3 cm cc in the coronal plane. The lesion is  not well evaluated in the axial plane due to motion artifact. There is mild vasogenic edema surrounding this lesion without significant mass effect. No other enhancing lesions are seen. There is no evidence of acute intracranial hemorrhage or infarct. A small focus of susceptibility in the left frontal lobe is nonspecific. There is no midline shift. Vascular: Normal flow voids. Skull and upper cervical spine: There is no definite abnormal signal abnormality, though the upper cervical spine is not well evaluated due to significant motion artifact. Sinuses/Orbits: The paranasal sinuses are clear. T2 hypointensity in the right globe is consistent with tear related to a prior procedure as seen on prior CT. Other: None. IMPRESSION: Two enhancing lesions in the left cerebral hemisphere, the larger in the posterior temporal lobe measuring up to 1.9 cm x 1.9 cm x 2.2 cm most concerning for metastatic disease given the patient's history and multiplicity. There is edema surrounding the lesions, more so in the left temporal lobe, without midline shift. Electronically Signed   By: PValetta MoleM.D.   On: 12/08/2020 12:09   CT HEAD CODE STROKE WO CONTRAST  Result Date: 12/08/2020 CLINICAL DATA:  Code stroke. EXAM: CT HEAD WITHOUT CONTRAST TECHNIQUE: Contiguous axial images were obtained from the base of the skull through the vertex without intravenous contrast. COMPARISON:  Brain MRI 10/07/2020 FINDINGS: Brain: There is edema in the left temporal lobe posteriorly which appears to spare the overlying gray matter. There is effacement of the surrounding sulci and partial effacement of  the occipital horn of the left lateral ventricle. There is dish in all hypodensity in the left superior parietal lobule with sparing of the overlying cortex (5-49). There is no evidence of acute intracranial hemorrhage. There is no extra-axial fluid collection. There is no midline shift. Vascular: There is mild calcification of the intracranial  ICAs. There is no dense vessel. Skull: Normal. Negative for fracture or focal lesion. Sinuses/Orbits: There is air in the right globe which is new since the prior study, likely postprocedural. The imaged paranasal sinuses are clear. Other: None. ASPECTS [facility] [place] Stroke Program Early CT Score) - Ganglionic level infarction (caudate, lentiform nuclei, internal capsule, insula, M1-M3 cortex): 7 - Supraganglionic infarction (M4-M6 cortex): 3 Total score (0-10 with 10 being normal): 10 IMPRESSION: 1. Edema in the left temporal and parietal lobes which appears to spare the overlying cortex is felt unlikely to be related to evolving infarct, and is more concerning for underlying mass lesion given the history of mono melanoma. Recommend MRI of the brain with and without contrast for further evaluation. 2. ASPECTS is 10 3. Air in the right globe is likely postprocedural. These results were called by telephone at the time of interpretation on 12/08/2020 at 9:16 am to provider Dr Theda Sers , who verbally acknowledged these results. Electronically Signed   By: Valetta Mole M.D.   On: 12/08/2020 09:17   CT ANGIO HEAD NECK W WO CM W PERF (CODE STROKE)  Result Date: 12/08/2020 EXAM: CT ANGIOGRAPHY HEAD AND NECK CT PERFUSION BRAIN TECHNIQUE: Multidetector CT imaging of the head and neck was performed using the standard protocol during bolus administration of intravenous contrast. Multiplanar CT image reconstructions and MIPs were obtained to evaluate the vascular anatomy. Carotid stenosis measurements (when applicable) are obtained utilizing NASCET criteria, using the distal internal carotid diameter as the denominator. Multiphase CT imaging of the brain was performed following IV bolus contrast injection. Subsequent parametric perfusion maps were calculated using RAPID software. CONTRAST:  146m OMNIPAQUE IOHEXOL 350 MG/ML SOLN COMPARISON:  Same day CT head, brain MRI 10/07/2020, CTA head/neck 10/06/2020 FINDINGS: CTA NECK  FINDINGS Aortic arch: Standard branching. Imaged portion shows no evidence of aneurysm or dissection. No significant stenosis of the major arch vessel origins. Right carotid system: No evidence of dissection, stenosis (50% or greater) or occlusion. Left carotid system: No evidence of dissection, stenosis (50% or greater) or occlusion. Vertebral arteries: Codominant. No evidence of dissection, stenosis (50% or greater) or occlusion. Skeleton: There is mild multilevel degenerative change of the cervical spine, most advanced at C5-C6. There is no suspicious osseous lesion. Other neck: The soft tissues are unremarkable. There is no pathologic lymphadenopathy in the neck. Upper chest: The lung apices are clear. Review of the MIP images confirms the above findings CTA HEAD FINDINGS Anterior circulation: There is mild calcification of the bilateral intracranial ICAs without significant stenosis or occlusion. The bilateral MCAs are patent. The bilateral ACAs are patent. There is no aneurysm. Posterior circulation: The bilateral V4 segments are patent. The basilar artery is patent. The bilateral PCAs are patent. There is no aneurysm. Venous sinuses: Patent. Anatomic variants: There is a prominent posterior communicating artery on the right. Review of the MIP images confirms the above findings CT Brain Perfusion Findings: CBF (<30%) Volume: 061mPerfusion (Tmax>6.0s) volume: 28m55mismatch Volume: N/a Infarction Location:N/a IMPRESSION: 1. Patent vasculature of the head and neck with no significant stenosis or occlusion. 2. No infarct core or penumbra identified. Electronically Signed   By: PetCourt Joy  On: 12/08/2020 09:26    Procedures Procedures   Medications Ordered in ED Medications  dexamethasone (DECADRON) injection 10 mg (has no administration in time range)  levETIRAcetam (KEPPRA) tablet 500 mg (has no administration in time range)  iohexol (OMNIPAQUE) 350 MG/ML injection 100 mL (100 mLs Intravenous  Contrast Given 12/08/20 0911)  LORazepam (ATIVAN) injection 1 mg (1 mg Intravenous Given 12/08/20 1036)  gadobutrol (GADAVIST) 1 MMOL/ML injection 7 mL (7 mLs Intravenous Contrast Given 12/08/20 1156)    ED Course  I have reviewed the triage vital signs and the nursing notes.  Pertinent labs & imaging results that were available during my care of the patient were reviewed by me and considered in my medical decision making (see chart for details).    MDM Rules/Calculators/A&P                           Code stroke team and myself met pt at the bridge upon EMS arrival.  Pt taken straight to CT.    Pt looks to have a possible tumor.  Pt was given decadron 10 mg IV.  Neurology will consult NS and neuro-oncology.  O2 sats dropped briefly and pt placed on oxygen.  She took it out of her nose and O2 sats have been nl.  Pt is saying some words, but is making no sense.  Her son is in the room and I told him the results of the MRI.  Pt d/w Dr. Rogers Blocker (triad) for admission.  CRITICAL CARE Performed by: Isla Pence   Total critical care time: 30 minutes  Critical care time was exclusive of separately billable procedures and treating other patients.  Critical care was necessary to treat or prevent imminent or life-threatening deterioration.  Critical care was time spent personally by me on the following activities: development of treatment plan with patient and/or surrogate as well as nursing, discussions with consultants, evaluation of patient's response to treatment, examination of patient, obtaining history from patient or surrogate, ordering and performing treatments and interventions, ordering and review of laboratory studies, ordering and review of radiographic studies, pulse oximetry and re-evaluation of patient's condition.  Final Clinical Impression(s) / ED Diagnoses Final diagnoses:  Aphasia  Brain metastasis ([place])  Vasogenic edema ([place])    Rx / DC Orders ED Discharge Orders      None        Isla Pence, MD 12/08/20 1337

## 2020-12-08 NOTE — ED Notes (Signed)
Pt not in room.

## 2020-12-09 ENCOUNTER — Inpatient Hospital Stay (HOSPITAL_COMMUNITY): Payer: Medicare Other

## 2020-12-09 ENCOUNTER — Ambulatory Visit
Admit: 2020-12-09 | Discharge: 2020-12-09 | Disposition: A | Discharge status: Home / Self Care | Payer: Medicare Other | Source: Ambulatory Visit | Attending: Radiation Oncology | Admitting: Radiation Oncology

## 2020-12-09 ENCOUNTER — Other Ambulatory Visit: Payer: Self-pay | Admitting: Radiation Therapy

## 2020-12-09 ENCOUNTER — Other Ambulatory Visit: Payer: Self-pay | Admitting: Family

## 2020-12-09 DIAGNOSIS — C7931 Secondary malignant neoplasm of brain: Secondary | ICD-10-CM | POA: Insufficient documentation

## 2020-12-09 LAB — CBC
HCT: 35.5 % — ABNORMAL LOW (ref 36.0–46.0)
Hemoglobin: 10.8 g/dL — ABNORMAL LOW (ref 12.0–15.0)
MCH: 23.1 pg — ABNORMAL LOW (ref 26.0–34.0)
MCHC: 30.4 g/dL (ref 30.0–36.0)
MCV: 75.9 fL — ABNORMAL LOW (ref 80.0–100.0)
Platelets: 439 10*3/uL — ABNORMAL HIGH (ref 150–400)
RBC: 4.68 MIL/uL (ref 3.87–5.11)
RDW: 17.8 % — ABNORMAL HIGH (ref 11.5–15.5)
WBC: 12.2 10*3/uL — ABNORMAL HIGH (ref 4.0–10.5)
nRBC: 0 % (ref 0.0–0.2)

## 2020-12-09 LAB — BASIC METABOLIC PANEL
Anion gap: 11 (ref 5–15)
BUN: 9 mg/dL (ref 8–23)
CO2: 23 mmol/L (ref 22–32)
Calcium: 9.8 mg/dL (ref 8.9–10.3)
Chloride: 101 mmol/L (ref 98–111)
Creatinine, Ser: 0.59 mg/dL (ref 0.44–1.00)
GFR, Estimated: >60 mL/min (ref >60)
Glucose, Bld: 113 mg/dL — ABNORMAL HIGH (ref 70–99)
Potassium: 3.9 mmol/L (ref 3.5–5.1)
Sodium: 135 mmol/L (ref 135–145)

## 2020-12-09 LAB — TSH: TSH: 0.783 u[IU]/mL (ref 0.350–4.500)

## 2020-12-09 IMAGING — CT CT CHEST-ABD-PELV W/ CM
3 of 9 series · 13 of 46 positions shown, 18 images · IV contrast (omnipaque)
Comparison: None.

CLINICAL DATA: Melanoma of the right hip. Now with brain
metastases. Staging.

EXAM:
CT CHEST, ABDOMEN, AND PELVIS WITH CONTRAST
TECHNIQUE: Multidetector CT imaging of the chest, abdomen and pelvis was
performed following the standard protocol during bolus
administration of intravenous contrast.
CONTRAST:  50mL OMNIPAQUE IOHEXOL 350 MG/ML SOLN

[Series 3: abdomen 5.0 · axial · 0.84mm/px · z∈[-354,-134]mm · 3 of 90 slices shown, 7 images]
[im 23/90  soft-tissue]
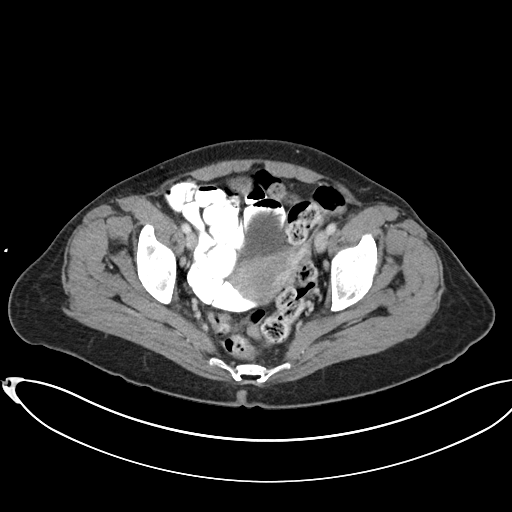
[im 23/90  lung]
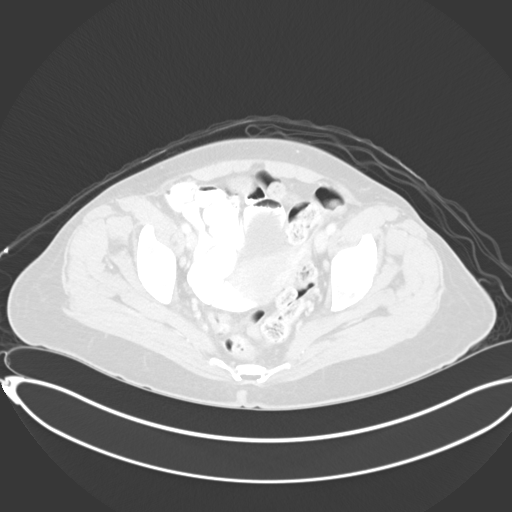
[im 23/90  bone]
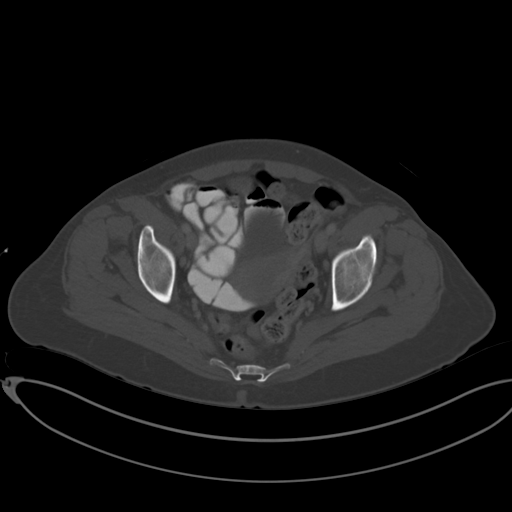
[im 45/90  soft-tissue]
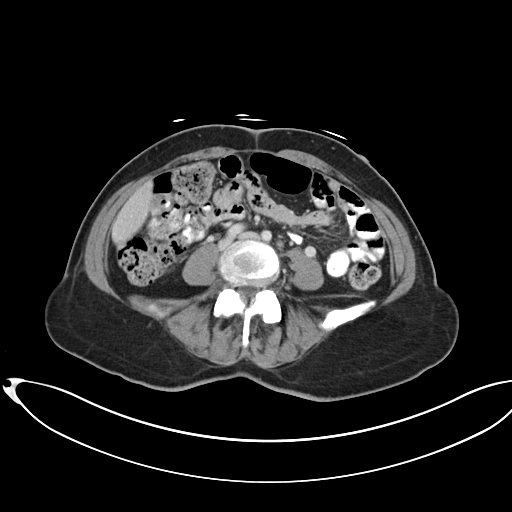
[im 45/90  lung]
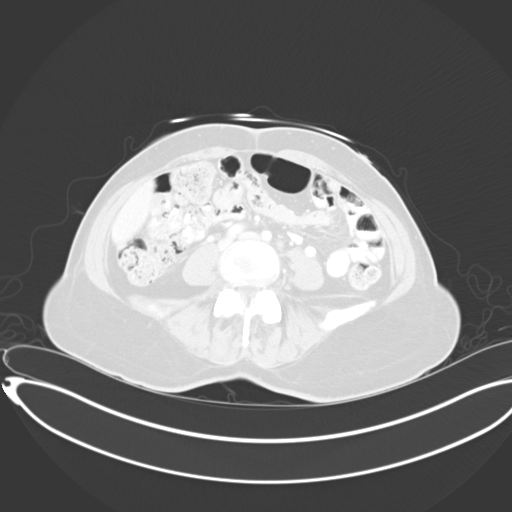
[im 67/90  soft-tissue]
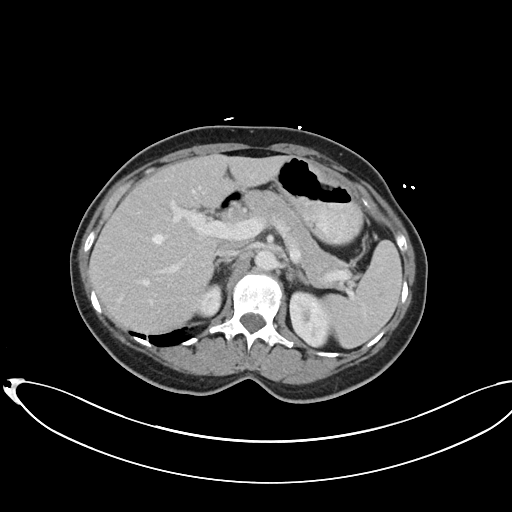
[im 67/90  lung]
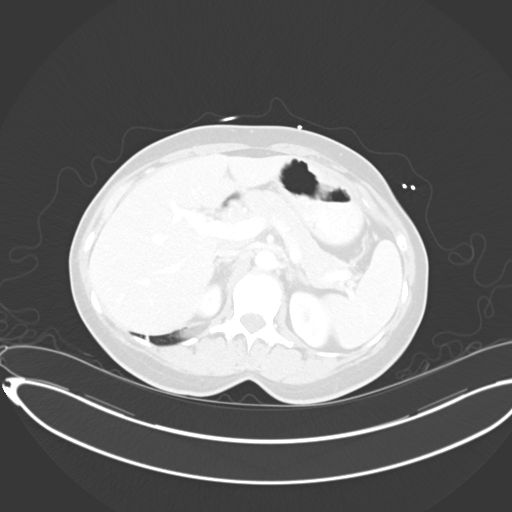

[Series 11: chest w · axial · 0.74mm/px · z∈[-123,+87]mm · 7 of 158 slices shown]
[im 18/158  soft-tissue]
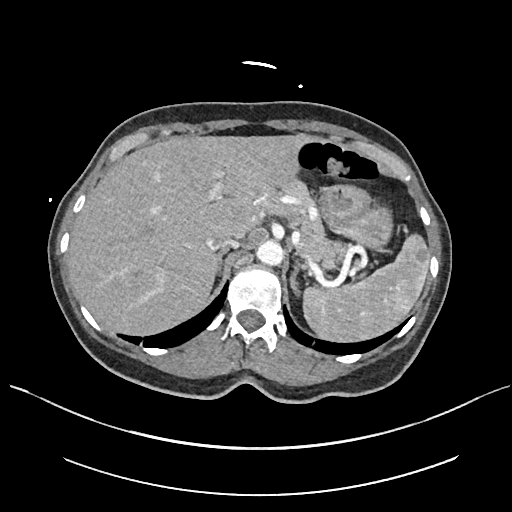
[im 35/158  soft-tissue]
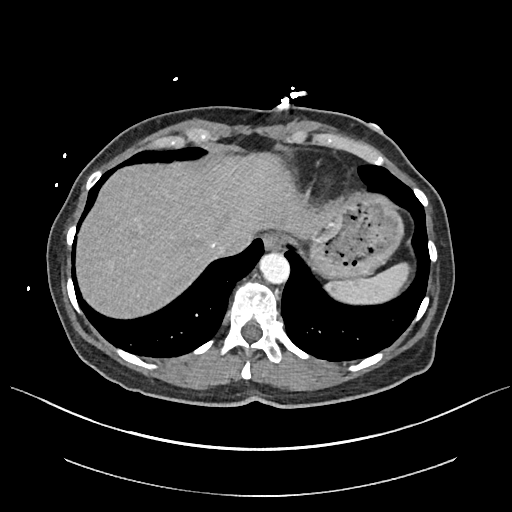
[im 53/158  soft-tissue]
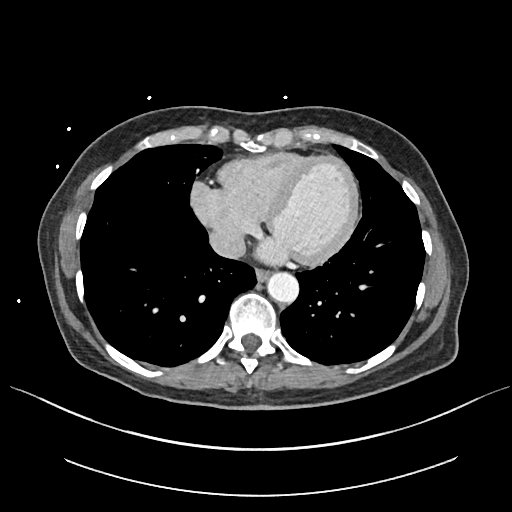
[im 70/158  soft-tissue]
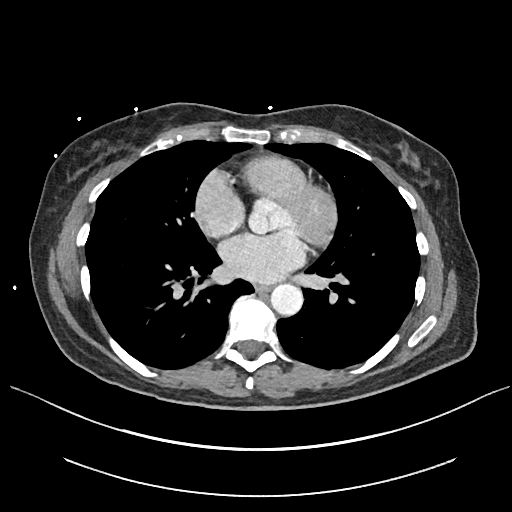
[im 88/158  soft-tissue]
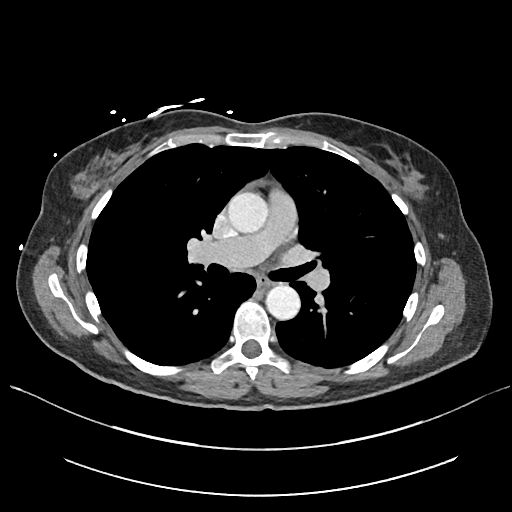
[im 105/158  soft-tissue]
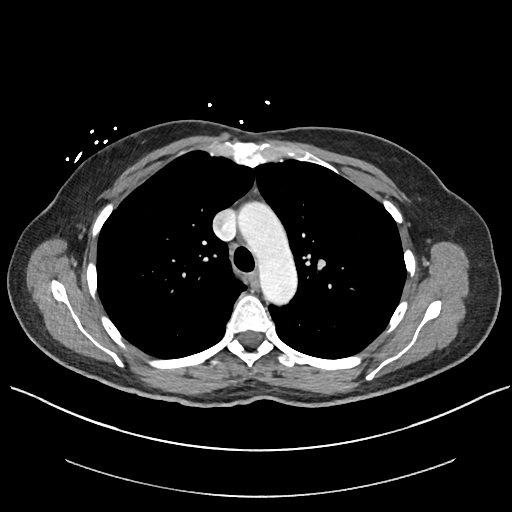
[im 123/158  soft-tissue]
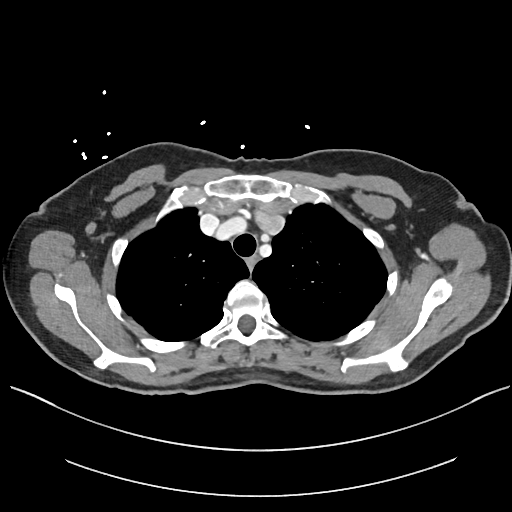

[Series 14: cor · coronal · 0.62mm/px · 3 of 112 slices shown, 4 images]
[im 23/112  soft-tissue]
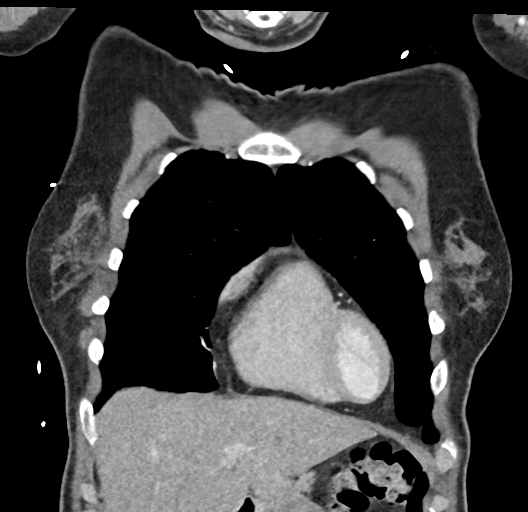
[im 45/112  soft-tissue]
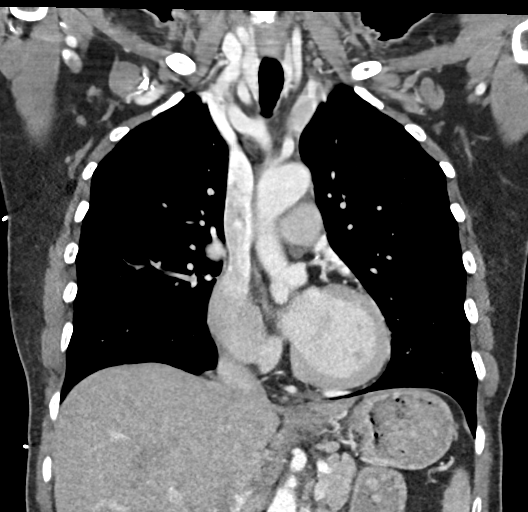
[im 45/112  bone]
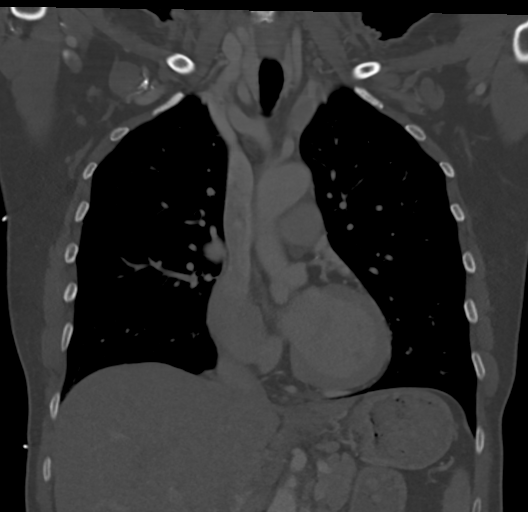
[im 67/112  soft-tissue]
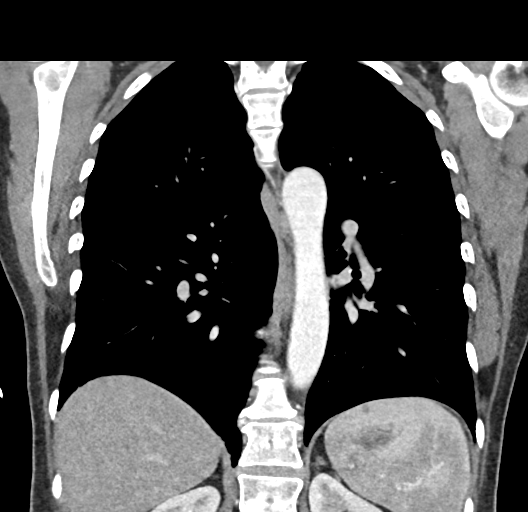

[13 of 46 positions shown; findings below may reference images not displayed]

FINDINGS: CT CHEST FINDINGS

Cardiovascular: The heart size is normal. No substantial pericardial
effusion.

Mediastinum/Nodes: No mediastinal lymphadenopathy. There is no hilar
lymphadenopathy. The esophagus has normal imaging features. There is
no axillary lymphadenopathy.

Lungs/Pleura: Minimal biapical pleuroparenchymal scarring. No
suspicious pulmonary nodule or mass. No focal airspace
consolidation. No pleural effusion.

Musculoskeletal: No worrisome lytic or sclerotic osseous
abnormality.

CT ABDOMEN PELVIS FINDINGS

Hepatobiliary: No suspicious focal abnormality within the liver
parenchyma. There is no evidence for gallstones, gallbladder wall
thickening, or pericholecystic fluid. No intrahepatic or
extrahepatic biliary dilation.

Pancreas: No focal mass lesion. No dilatation of the main duct. No
intraparenchymal cyst. No peripancreatic edema.

Spleen: 8 mm hypoattenuating lesion noted posterior spleen on [DATE]
with 9 mm hypoattenuating lesion anterior spleen on [DATE]. Subtle
tiny hypoattenuating inferior splenic lesion on [DATE].

Adrenals/Urinary Tract: 17 mm right adrenal nodule evident. Kidneys
unremarkable No evidence for hydroureter. The urinary bladder
appears normal for the degree of distention.

Stomach/Bowel: Stomach is unremarkable. No gastric wall thickening.
No evidence of outlet obstruction. Duodenum is normally positioned
as is the ligament of Treitz. No small bowel wall thickening. No
small bowel dilatation. The terminal ileum is normal. No gross
colonic mass. No colonic wall thickening.

Vascular/Lymphatic: No abdominal aortic aneurysm relatively bulky
lymphadenopathy is seen in the gastrohepatic ligament and celiac
axis. 16 mm short axis node visible on [DATE]. 16 mm lesion noted on
[DATE]. 12 mm short axis lymph node identified in the portal caval
space on [DATE]. Retroperitoneal lymphadenopathy evident with 13 mm
short axis left para-aortic node on [DATE]. No pelvic sidewall
lymphadenopathy.

Somewhat bulky

Reproductive: The uterus is unremarkable.  There is no adnexal mass.

Other: No intraperitoneal free fluid.

Musculoskeletal: No worrisome lytic or sclerotic osseous
abnormality.
IMPRESSION: 1. Prominent lymphadenopathy in the gastrohepatic ligament and
celiac axis with retroperitoneal lymphadenopathy. Imaging features
compatible with metastatic disease. PET-CT may prove helpful to
further evaluate, as clinically warranted.
2. 17 mm right adrenal nodule, indeterminate, but metastatic disease
not excluded.
3. Tiny hypoattenuating splenic lesions. Attention on follow-up
recommended.

## 2020-12-09 MED ORDER — IOHEXOL 9 MG/ML PO SOLN
500.0000 mL | ORAL | Status: AC
  Administered 2020-12-09 (×2): 500 mL via ORAL

## 2020-12-09 MED ORDER — DEXAMETHASONE SODIUM PHOSPHATE 4 MG/ML IJ SOLN
4.0000 mg | Freq: Two times a day (BID) | INTRAMUSCULAR | Status: DC
  Administered 2020-12-09 – 2020-12-12 (×7): 4 mg via INTRAVENOUS
  Filled 2020-12-09 (×7): qty 1

## 2020-12-09 MED ORDER — IOHEXOL 350 MG/ML SOLN
50.0000 mL | Freq: Once | INTRAVENOUS | Status: AC | PRN
  Administered 2020-12-09: 50 mL via INTRAVENOUS

## 2020-12-09 MED ORDER — IOHEXOL 350 MG/ML SOLN
80.0000 mL | Freq: Once | INTRAVENOUS | Status: AC | PRN
  Administered 2020-12-09: 80 mL via INTRAVENOUS

## 2020-12-09 NOTE — Consult Note (Addendum)
Radiation Oncology         [phone] ________________________________  Initial inpatient Consultation  Name: Ariel [name] MRN: [id number]  Date of Service: 12/08/2020 DOB: 1955-05-13  CC:[name], Ariel Sciara, NP  No ref. provider found   REFERRING PHYSICIAN: No ref. provider found  DIAGNOSIS: 65 y/o female with new, symptomatic brain lesions suspected secondary to melanoma but no tissue confirmation at this time.    ICD-10-CM   1. Aphasia  R47.01     2. Brain metastasis ([place])  C79.31     3. Vasogenic edema ([facility])  G93.6     4. 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 female seen at the request of Dr. Broadus Ariel.  She presented to the emergency department via EMS on 12/08/2020 with complaints of difficulty speaking, disoriented and blank stare/obtunded s/p a recent eye surgery for a right vitrectomy on 12/07/2020 in the management of a right vitreous hemorrhage.  She was noted to have right hemianopia on exam in the emergency department and reported that she had had similar symptoms in August 2022 which were transient and at that time she was diagnosed with a TIA.  A CT head and a CT angio head and neck were performed on admission and were without signs of infarct or vascular occlusion but did show edema in the left temporal and parietal lobes concerning for underlying mass.  An MRI brain scan was performed for further evaluation and showed a 2.2 cm enhancing lesion in the left posterior temporal lobe with surrounding vasogenic edema without midline shift, consistent with metastatic disease.  Additionally, there was a 1.3 cm left superior parietal lesion with mild associated vasogenic edema.  She has a history of stage I melanoma excised from the right hip in 2021 and did not require any adjuvant therapy at that time.  She has been followed by Dr. Marin Olp.   Dr. Marcello [name] ,in neurosurgery, was consulted regarding results of the MRI brain and he has recommended stereotactic radiosurgery given the location of the larger lesion in the speech reception area of the brain which would make surgery high risk.  She is currently on Keppra 500 mg p.o. twice daily for seizure prophylaxis and also on Decadron 4 mg every 12 hours.  Despite the steroids, she continues with significant expressive and receptive aphasia as well as reduced cognition.  She has not had any systemic imaging to evaluate for any further sites of disease that might be amenable to biopsy for tissue confirmation.  We have been asked to consult this patient for consideration of stereotactic radiosurgery in the management of her limited brain lesions.  PREVIOUS RADIATION THERAPY: No  PAST MEDICAL HISTORY:  Past Medical History:  Diagnosis Date   Chicken pox    Complication of anesthesia    Per patient, very slow to wake up after anesthesia   High cholesterol    Hyperglycemia 11/10/2020   Melanoma ([place]) 2021   right hip   PONV (postoperative nausea and vomiting)    Urinary incontinence       PAST SURGICAL HISTORY: Past Surgical History:  Procedure Laterality Date   AIR/FLUID EXCHANGE Right 12/07/2020   Procedure: AIR/FLUID EXCHANGE;  Surgeon: Ariel Mullet, MD;  Location: [facility];  Service: Ophthalmology;  Laterality: Right;   APPENDECTOMY  1962   EXCISION MELANOMA WITH SENTINEL LYMPH NODE BIOPSY  Right 10/09/2019   Procedure: WIDE LOCAL EXCISION WITH ADVANCEMENT FLAP CLOSURE RIGHT HIP MELANOMA WITH SENTINEL LYMPH NODE BIOPSY;  Surgeon: Stark Klein, MD;  Location: [facility];  Service: General;  Laterality: Right;   HERNIA REPAIR  2009   MOUTH SURGERY  11/2015   gum surgery and bone implant   PARS PLANA VITRECTOMY Right 12/07/2020   Procedure: PARS PLANA VITRECTOMY WITH 25 GAUGE;  Surgeon: Ariel Mullet, MD;  Location: [facility];  Service: Ophthalmology;  Laterality: Right;   PHOTOCOAGULATION  WITH LASER Right 12/07/2020   Procedure: PHOTOCOAGULATION WITH ENDOLASER PANRITINAL COAGULATION;  Surgeon: Ariel Mullet, MD;  Location: [facility];  Service: Ophthalmology;  Laterality: Right;   WISDOM TOOTH EXTRACTION      FAMILY HISTORY:  Family History  Problem Relation Age of Onset   Coronary artery disease Father        died at 48   COPD Father    Alcohol abuse Father    Diabetes Mellitus II Father    Coronary artery disease Sister    Cancer Sister        breast   Heart attack Brother    Hyperlipidemia Other        died 71- "natural causes"   Hyperlipidemia Other        4 siblings    Hypertension Other        3 siblings    SOCIAL HISTORY:  Social History   Socioeconomic History   Marital status: Widowed    Spouse name: Not on file   Number of children: Not on file   Years of education: Not on file   Highest education level: Not on file  Occupational History   Occupation: retired  Tobacco Use   Smoking status: Never   Smokeless tobacco: Never  Vaping Use   Vaping Use: Never used  Substance and Sexual Activity   Alcohol use: Never   Drug use: Never   Sexual activity: Not Currently  Other Topics Concern   Not on file  Social History Narrative   Lives with husband   One Son- lives locally, 1 grand-daughter   Daughter- Sport and exercise psychologist. [place]   Completed Hotel manager school   Works as a Probation officer.         Social Determinants of Radio broadcast assistant Strain: Low Risk    Difficulty of Paying Living Expenses: Not hard at all  Food Insecurity: No Food Insecurity   Worried About Charity fundraiser in the Last Year: Never true   Arboriculturist in the Last Year: Never true  Transportation Needs: No Transportation Needs   Lack of Transportation (Medical): No   Lack of Transportation (Non-Medical): No  Physical Activity: Sufficiently Active   Days of Exercise per Week: 4 days   Minutes of Exercise per Session: 40 min  Stress: No Stress Concern Present    Feeling of Stress : Not at all  Social Connections: Moderately Isolated   Frequency of Communication with Friends and Family: More than three times a week   Frequency of Social Gatherings with Friends and Family: More than three times a week   Attends Religious Services: More than 4 times per year   Active Member of [employer] or Organizations: No   Attends Archivist Meetings: Never   Marital Status: Widowed  Intimate Partner Violence: Not At Risk   Fear of Current or Ex-Partner: No   Emotionally Abused: No   Physically Abused: No   Sexually  Abused: No    ALLERGIES: Codeine and Plavix [clopidogrel]  MEDICATIONS:  Current Facility-Administered Medications  Medication Dose Route Frequency Provider Last Rate Last Admin   0.9 %  sodium chloride infusion  250 mL Intravenous PRN Orma Flaming, MD       acetaminophen (TYLENOL) tablet 650 mg  650 mg Oral Q6H PRN Orma Flaming, MD   650 mg at 12/08/20 2137   Or   acetaminophen (TYLENOL) suppository 650 mg  650 mg Rectal Q6H PRN Orma Flaming, MD       dexamethasone (DECADRON) injection 4 mg  4 mg Intravenous Q12H Domenic Polite, MD       erythromycin ophthalmic ointment   Right Eye BID Ariel Mullet, MD   1 application at 09/38/18 1001   levETIRAcetam (KEPPRA) tablet 500 mg  500 mg Oral BID Rikki Spearing, NP   500 mg at 12/09/20 0959   ofloxacin (OCUFLOX) 0.3 % ophthalmic solution 1 drop  1 drop Right Eye QID Ariel Mullet, MD   1 drop at 12/09/20 1001   prednisoLONE acetate (PRED FORTE) 1 % ophthalmic suspension 1 drop  1 drop Right Eye QID Ariel Mullet, MD   1 drop at 12/09/20 1000   sodium chloride flush (NS) 0.9 % injection 3 mL  3 mL Intravenous Q12H Orma Flaming, MD   3 mL at 12/09/20 1005   sodium chloride flush (NS) 0.9 % injection 3 mL  3 mL Intravenous PRN Orma Flaming, MD        REVIEW OF SYSTEMS:  On review of systems, obtained from the patient's son, [place], due to patient's aphasia, the patient reports  that she is doing fair overall.  As noted in HPI, she continues with significant expressive and receptive aphasia.  She denies any chest pain, shortness of breath, cough, fevers, chills, night sweats, or unintended weight changes. She denies any bowel or bladder disturbances, and denies abdominal pain, nausea or vomiting. She denies any new musculoskeletal or joint aches or pains. A complete review of systems is obtained and is otherwise negative.    PHYSICAL EXAM:  Wt Readings from Last 3 Encounters:  12/08/20 142 lb 13.7 oz (64.8 kg)  12/07/20 142 lb (64.4 kg)  12/01/20 143 lb (64.9 kg)   Temp Readings from Last 3 Encounters:  12/09/20 98.2 F (36.8 C) (Axillary)  12/07/20 (!) 97.5 F (36.4 C)  12/01/20 98.8 F (37.1 C) (Oral)   BP Readings from Last 3 Encounters:  12/09/20 133/67  12/07/20 139/71  12/01/20 130/67   Pulse Readings from Last 3 Encounters:  12/09/20 85  12/07/20 75  12/01/20 87   Pain Assessment Pain Score: 0-No pain/10  Unable to assess.  KPS = 60  100 - Normal; no complaints; no evidence of disease. 90   - Able to carry on normal activity; minor signs or symptoms of disease. 80   - Normal activity with effort; some signs or symptoms of disease. 20   - Cares for self; unable to carry on normal activity or to do active work. 60   - Requires occasional assistance, but is able to care for most of his personal needs. 50   - Requires considerable assistance and frequent medical care. 37   - Disabled; requires special care and assistance. 44   - Severely disabled; hospital admission is indicated although death not imminent. 60   - Very sick; hospital admission necessary; active supportive treatment necessary. 10   - Moribund; fatal processes progressing rapidly. 0     -  Dead  Karnofsky DA, Abelmann Diamond, Craver LS and Burchenal [facility] [phone]) The use of the nitrogen mustards in the palliative treatment of carcinoma: with particular reference to bronchogenic  carcinoma Cancer 1 634-56  LABORATORY DATA:  Lab Results  Component Value Date   WBC 12.2 (H) 12/09/2020   HGB 10.8 (L) 12/09/2020   HCT 35.5 (L) 12/09/2020   MCV 75.9 (L) 12/09/2020   PLT 439 (H) 12/09/2020   Lab Results  Component Value Date   NA 135 12/09/2020   K 3.9 12/09/2020   CL 101 12/09/2020   CO2 23 12/09/2020   Lab Results  Component Value Date   ALT 39 12/08/2020   AST 28 12/08/2020   ALKPHOS 84 12/08/2020   BILITOT 0.5 12/08/2020     RADIOGRAPHY: MR BRAIN W WO CONTRAST  Result Date: 12/08/2020 CLINICAL DATA:  History of melanoma, evaluate for metastatic disease EXAM: MRI HEAD WITHOUT AND WITH CONTRAST TECHNIQUE: Multiplanar, multiecho pulse sequences of the brain and surrounding structures were obtained without and with intravenous contrast. CONTRAST:  39mL GADAVIST GADOBUTROL 1 MMOL/ML IV SOLN COMPARISON:  Same-day noncontrast CT head, brain MRI 10/07/2020 FINDINGS: Brain: There is an enhancing lesion in the left posterior temporal lobe measuring 1.9 cm by 1.9 cm x 2.2 cm consistent with metastatic disease. There is surrounding vasogenic edema resulting in effacement of the adjacent sulci and slight effacement of the occipital horn of the left lateral ventricle. There is an additional enhancing lesion in the left superior parietal lobule measuring approximately 0.9 cm TV by 1.3 cm cc in the coronal plane. The lesion is not well evaluated in the axial plane due to motion artifact. There is mild vasogenic edema surrounding this lesion without significant mass effect. No other enhancing lesions are seen. There is no evidence of acute intracranial hemorrhage or infarct. A small focus of susceptibility in the left frontal lobe is nonspecific. There is no midline shift. Vascular: Normal flow voids. Skull and upper cervical spine: There is no definite abnormal signal abnormality, though the upper cervical spine is not well evaluated due to significant motion artifact.  Sinuses/Orbits: The paranasal sinuses are clear. T2 hypointensity in the right globe is consistent with tear related to a prior procedure as seen on prior CT. Other: None. IMPRESSION: Two enhancing lesions in the left cerebral hemisphere, the larger in the posterior temporal lobe measuring up to 1.9 cm x 1.9 cm x 2.2 cm most concerning for metastatic disease given the patient's history and multiplicity. There is edema surrounding the lesions, more so in the left temporal lobe, without midline shift. Electronically Signed   By: Valetta Mole M.D.   On: 12/08/2020 12:09   CARDIAC EVENT MONITOR  Result Date: 11/21/2020 An extended event monitor was performed in 10/14/2018 to 11/11/2020 to assess arrhythmia in the context of recent hospitalization for TIA. The rhythm throughout was sinus.  No episodes of atrial fibrillation or flutter. There were no pauses of 3 seconds or greater and no episodes of second or third-degree AV nodal block or sinus node exit block. Ventricular ectopy was rare. Supraventricular ectopy was rare. There were 7 triggered events for sinus rhythm 3 sinus tachycardia with a maximum rate of 112 bpm. Conclusion unremarkable extended event monitor in the context of TIA.  CT HEAD CODE STROKE WO CONTRAST  Result Date: 12/08/2020 CLINICAL DATA:  Code stroke. EXAM: CT HEAD WITHOUT CONTRAST TECHNIQUE: Contiguous axial images were obtained from the base of the skull through the vertex without intravenous contrast.  COMPARISON:  Brain MRI 10/07/2020 FINDINGS: Brain: There is edema in the left temporal lobe posteriorly which appears to spare the overlying gray matter. There is effacement of the surrounding sulci and partial effacement of the occipital horn of the left lateral ventricle. There is dish in all hypodensity in the left superior parietal lobule with sparing of the overlying cortex (5-49). There is no evidence of acute intracranial hemorrhage. There is no extra-axial fluid collection. There  is no midline shift. Vascular: There is mild calcification of the intracranial ICAs. There is no dense vessel. Skull: Normal. Negative for fracture or focal lesion. Sinuses/Orbits: There is air in the right globe which is new since the prior study, likely postprocedural. The imaged paranasal sinuses are clear. Other: None. ASPECTS [facility] Stroke Program Early CT Score) - Ganglionic level infarction (caudate, lentiform nuclei, internal capsule, insula, M1-M3 cortex): 7 - Supraganglionic infarction (M4-M6 cortex): 3 Total score (0-10 with 10 being normal): 10 IMPRESSION: 1. Edema in the left temporal and parietal lobes which appears to spare the overlying cortex is felt unlikely to be related to evolving infarct, and is more concerning for underlying mass lesion given the history of mono melanoma. Recommend MRI of the brain with and without contrast for further evaluation. 2. ASPECTS is 10 3. Air in the right globe is likely postprocedural. These results were called by telephone at the time of interpretation on 12/08/2020 at 9:16 am to provider Dr Theda Sers , who verbally acknowledged these results. Electronically Signed   By: Valetta Mole M.D.   On: 12/08/2020 09:17   CT ANGIO HEAD NECK W WO CM W PERF (CODE STROKE)  Result Date: 12/08/2020 EXAM: CT ANGIOGRAPHY HEAD AND NECK CT PERFUSION BRAIN TECHNIQUE: Multidetector CT imaging of the head and neck was performed using the standard protocol during bolus administration of intravenous contrast. Multiplanar CT image reconstructions and MIPs were obtained to evaluate the vascular anatomy. Carotid stenosis measurements (when applicable) are obtained utilizing NASCET criteria, using the distal internal carotid diameter as the denominator. Multiphase CT imaging of the brain was performed following IV bolus contrast injection. Subsequent parametric perfusion maps were calculated using RAPID software. CONTRAST:  149mL OMNIPAQUE IOHEXOL 350 MG/ML SOLN COMPARISON:  Same day  CT head, brain MRI 10/07/2020, CTA head/neck 10/06/2020 FINDINGS: CTA NECK FINDINGS Aortic arch: Standard branching. Imaged portion shows no evidence of aneurysm or dissection. No significant stenosis of the major arch vessel origins. Right carotid system: No evidence of dissection, stenosis (50% or greater) or occlusion. Left carotid system: No evidence of dissection, stenosis (50% or greater) or occlusion. Vertebral arteries: Codominant. No evidence of dissection, stenosis (50% or greater) or occlusion. Skeleton: There is mild multilevel degenerative change of the cervical spine, most advanced at C5-C6. There is no suspicious osseous lesion. Other neck: The soft tissues are unremarkable. There is no pathologic lymphadenopathy in the neck. Upper chest: The lung apices are clear. Review of the MIP images confirms the above findings CTA HEAD FINDINGS Anterior circulation: There is mild calcification of the bilateral intracranial ICAs without significant stenosis or occlusion. The bilateral MCAs are patent. The bilateral ACAs are patent. There is no aneurysm. Posterior circulation: The bilateral V4 segments are patent. The basilar artery is patent. The bilateral PCAs are patent. There is no aneurysm. Venous sinuses: Patent. Anatomic variants: There is a prominent posterior communicating artery on the right. Review of the MIP images confirms the above findings CT Brain Perfusion Findings: CBF (<30%) Volume: 57mL Perfusion (Tmax>6.0s) volume: 12mL Mismatch Volume:  N/a Infarction Location:N/a IMPRESSION: 1. Patent vasculature of the head and neck with no significant stenosis or occlusion. 2. No infarct core or penumbra identified. Electronically Signed   By: Valetta Mole M.D.   On: 12/08/2020 09:26      IMPRESSION/PLAN: 1. 65 y.o. female with new, symptomatic brain lesions suspected secondary to melanoma but no tissue confirmation at this time. Her case was reviewed with Dr. Lisbeth Renshaw and the recommendation is to proceed  with disease staging imaging with CT C/A/P to assess for other sites of disease that would be amenable to biopsy for tissue confirmation prior to proceeding with treatment.  She is potentially a candidate for SRS so we are planning to discuss her case at the multidisciplinary brain and spine conference on Monday, 12/13/2020 and we will plan to reach out thereafter to discuss consensus recommendations at that time.  f she is deemed stable for discharge prior to Monday, we can certainly continue treatment planning on an outpatient basis. In the interim, we recommend continuing seizure prophylaxis with Keppra as well as continuing the Decadron but would recommend increasing the dose of Decadron to 4 mg p.o. 3 times daily in hopes that we will see further improvement in the aphasia.  Her son, [place], appears to have a good understanding of her disease and our recommendations and is in agreement with the stated plan.  We will share our discussion with her treatment team and plan to continue following along in her care. I  I personally spent 70 minutes in this encounter including chart review, reviewing radiological studies, preparing for the visit, in phone conversation with the patient and her son, [place], entering orders, coordinating care and completing documentation.    Nicholos Johns, PA-C ------------------------------------------------  Jodelle Gross, MD, PhD   [facility]  Radiation Oncology Direct Dial: [phone]  Fax: [phone] [email or web address]  Skype  LinkedIn

## 2020-12-09 NOTE — Progress Notes (Signed)
Pt is alert but has expressive aphasia. Unable to complete admission questions, no family present at bedside.

## 2020-12-09 NOTE — Progress Notes (Signed)
PROGRESS NOTE    Ariel [name]  POE:[id number] DOB: 02-19-1956 DOA: 12/08/2020 PCP: [name], NP  Brief Narrative: 65 year old female with history of hypertension, iron deficiency anemia, TIA, history of melanoma of right hip which was resected in 8/21,, history of vitreous hemorrhage, just underwent right eye vitrectomy, subconjunctival hemorrhage was brought to the ED with acute onset confusion, speech deficits, inability to follow commands and blank stares,, symptoms started today-admission. -MRI brain noted 2 enhancing lesions in the left cerebral hemisphere larger in the posterior temporal lobe concerning for metastatic disease, edema was noted surrounding the lesions. -Seen by neurosurgery, neuro-oncology was consulted, stereotactic radiosurgery was recommended   Assessment & Plan:   Brain metastasis Suspected melanoma primary, had melanoma resected from right hip in 09/2019 -was stage I disease at the time of diagnosis, did not require additional therapy beyond excision -Neuro oncology Dr. Mickeal Skinner was consulted yesterday -Neurosurgery Dr. Marcello Moores following, recommended stereotactic radiosurgery, will consult radiation oncology, may need transfer to [facility] for planning, XRT, with her confusion she is not stable for discharge home and outpatient follow-up at this time -Continue Decadron -Started on low-dose Keppra for seizure prophylaxis  Aphasia, expressive and receptive Confusion -Secondary to above, monitor with steroids, Keppra  Right eye vitreous hemorrhage -Status post right vitrectomy and endolaser on 12/08/2010 by Dr. Posey Pronto -With subconjunctival hemorrhage -Continue Pred forte, ofloxacin, erythromycin ophthalmic ointment to right eye -Follow-up with Dr. Posey Pronto   DVT prophylaxis: SCDs Code Status: Full code Family Communication: Discussed with son at bedside Disposition Plan:  Status is: Inpatient  Remains inpatient appropriate because:Inpatient level of  care appropriate due to severity of illness  Dispo: The patient is from: Home              Anticipated d/c is to: Possibly SNF              Patient currently is not medically stable to d/c.   Difficult to place patient No   Consultants:  Neurosurgery Neurooncology Dr. Mickeal Skinner Radiation oncology  Procedures:   Antimicrobials:    Subjective: -Pleasantly confused, sitting up in bed  Objective: Vitals:   12/08/20 1854 12/08/20 2000 12/08/20 2329 12/09/20 0822  BP:  (!) 114/47 (!) 102/58 (!) 122/59  Pulse: 80 66 63 60  Resp: 15 14 20 17   Temp:  98.1 F (36.7 C) 98.8 F (37.1 C) 98.5 F (36.9 C)  TempSrc:  Oral Oral Oral  SpO2: 94% 95% 98% 96%  Weight:      Height:       No intake or output data in the 24 hours ending 12/09/20 1039 Filed Weights   12/08/20 0800 12/08/20 1018  Weight: 64.8 kg 64.8 kg    Examination:  General exam: Pleasant middle-aged female sitting up in bed, awake alert, oriented to self and partly to place only, expressive and receptive aphasia noted HEENT: No JVD CVS: S1-S2, regular rate rhythm Lungs: Clear bilaterally Abdomen: Soft, nontender, bowel sounds present Extremities: No edema Neuro: Positive aphasia, strength in upper and lower extremities within normal limits  Psychiatry: Poor insight and judgment    Data Reviewed:   CBC: Recent Labs  Lab 12/08/20 0855 12/08/20 0857 12/09/20 0400  WBC 10.0  --  12.2*  NEUTROABS 7.5  --   --   HGB 10.4* 11.6* 10.8*  HCT 34.7* 34.0* 35.5*  MCV 78.9*  --  75.9*  PLT 425*  --  315*   Basic Metabolic Panel: Recent Labs  Lab 12/08/20 0855 12/08/20 0857 12/09/20  0400  NA 133* 135 135  K 4.2 4.2 3.9  CL 99 98 101  CO2 26  --  23  GLUCOSE 131* 129* 113*  BUN 11 12 9   CREATININE 0.62 0.50 0.59  CALCIUM 9.5  --  9.8   GFR: Estimated Creatinine Clearance: 64.8 mL/min (by C-G formula based on SCr of 0.59 mg/dL). Liver Function Tests: Recent Labs  Lab 12/08/20 0855  AST 28  ALT 39   ALKPHOS 84  BILITOT 0.5  PROT 8.1  ALBUMIN 2.8*   No results for input(s): LIPASE, AMYLASE in the last 168 hours. No results for input(s): AMMONIA in the last 168 hours. Coagulation Profile: Recent Labs  Lab 12/08/20 0855  INR 1.0   Cardiac Enzymes: No results for input(s): CKTOTAL, CKMB, CKMBINDEX, TROPONINI in the last 168 hours. BNP (last 3 results) No results for input(s): PROBNP in the last 8760 hours. HbA1C: No results for input(s): HGBA1C in the last 72 hours. CBG: Recent Labs  Lab 12/08/20 0852  GLUCAP 125*   Lipid Profile: No results for input(s): CHOL, HDL, LDLCALC, TRIG, CHOLHDL, LDLDIRECT in the last 72 hours. Thyroid Function Tests: Recent Labs    12/09/20 0400  TSH 0.783   Anemia Panel: No results for input(s): VITAMINB12, FOLATE, FERRITIN, TIBC, IRON, RETICCTPCT in the last 72 hours. Urine analysis:    Component Value Date/Time   COLORURINE YELLOW 11/30/2020 1218   APPEARANCEUR CLEAR 11/30/2020 1218   LABSPEC <=1.005 (A) 11/30/2020 1218   PHURINE 6.5 11/30/2020 [place] 11/30/2020 1218   HGBUR SMALL (A) 11/30/2020 1218   BILIRUBINUR NEGATIVE 11/30/2020 1218   BILIRUBINUR neg 06/15/2020 1333   KETONESUR NEGATIVE 11/30/2020 1218   PROTEINUR NEGATIVE 10/06/2020 1844   UROBILINOGEN 0.2 11/30/2020 1218   NITRITE NEGATIVE 11/30/2020 1218   LEUKOCYTESUR NEGATIVE 11/30/2020 1218   Sepsis Labs: @LABRCNTIP (procalcitonin:4,lacticidven:4)  ) Recent Results (from the past 240 hour(s))  Culture, blood (single)     Status: None   Collection Time: 11/30/20 12:19 PM   Specimen: Blood  Result Value Ref Range Status   MICRO NUMBER: [id number]  Final   SPECIMEN QUALITY: Adequate  Final   Source NOT GIVEN  Final   STATUS: FINAL  Final   Result: No growth after 5 days  Final   COMMENT: Aerobic and anaerobic bottle received.  Final  Culture, blood (single)     Status: None   Collection Time: 11/30/20 12:19 PM   Specimen: Blood  Result  Value Ref Range Status   MICRO NUMBER: [id number]  Final   SPECIMEN QUALITY: Adequate  Final   Source NOT GIVEN  Final   STATUS: FINAL  Final   Result: No growth after 5 days  Final   COMMENT: Aerobic and anaerobic bottle received.  Final  Resp Panel by RT-PCR (Flu A&B, Covid)     Status: None   Collection Time: 12/08/20  9:17 AM  Result Value Ref Range Status   SARS Coronavirus 2 by RT PCR NEGATIVE NEGATIVE Final    Comment: (NOTE) SARS-CoV-2 target nucleic acids are NOT DETECTED.  The SARS-CoV-2 RNA is generally detectable in upper respiratory specimens during the acute phase of infection. The lowest concentration of SARS-CoV-2 viral copies this assay can detect is 138 copies/mL. A negative result does not preclude SARS-Cov-2 infection and should not be used as the sole basis for treatment or other patient management decisions. A negative result may occur with  improper specimen collection/handling, submission of specimen other than nasopharyngeal  swab, presence of viral mutation(s) within the areas targeted by this assay, and inadequate number of viral copies(<138 copies/mL). A negative result must be combined with clinical observations, patient history, and epidemiological information. The expected result is Negative.  Fact Sheet for Patients:  EntrepreneurPulse.com.au  Fact Sheet for Healthcare Providers:  IncredibleEmployment.be  This test is no t yet approved or cleared by the [place] FDA and  has been authorized for detection and/or diagnosis of SARS-CoV-2 by FDA under an Emergency Use Authorization (EUA). This EUA will remain  in effect (meaning this test can be used) for the duration of the COVID-19 declaration under Section 564(b)(1) of the Act, 21 U.S.C.section 360bbb-3(b)(1), unless the authorization is terminated  or revoked sooner.       Influenza A by PCR NEGATIVE NEGATIVE Final   Influenza B by PCR NEGATIVE NEGATIVE  Final    Comment: (NOTE) The Xpert Xpress SARS-CoV-2/FLU/RSV plus assay is intended as an aid in the diagnosis of influenza from Nasopharyngeal swab specimens and should not be used as a sole basis for treatment. Nasal washings and aspirates are unacceptable for Xpert Xpress SARS-CoV-2/FLU/RSV testing.  Fact Sheet for Patients: EntrepreneurPulse.com.au  Fact Sheet for Healthcare Providers: IncredibleEmployment.be  This test is not yet approved or cleared by the [place] FDA and has been authorized for detection and/or diagnosis of SARS-CoV-2 by FDA under an Emergency Use Authorization (EUA). This EUA will remain in effect (meaning this test can be used) for the duration of the COVID-19 declaration under Section 564(b)(1) of the Act, 21 U.S.C. section 360bbb-3(b)(1), unless the authorization is terminated or revoked.  Performed at [facility], [place] [address] [place], [place] [address]          Radiology Studies: MR BRAIN W WO CONTRAST  Result Date: 12/08/2020 CLINICAL DATA:  History of melanoma, evaluate for metastatic disease EXAM: MRI HEAD WITHOUT AND WITH CONTRAST TECHNIQUE: Multiplanar, multiecho pulse sequences of the brain and surrounding structures were obtained without and with intravenous contrast. CONTRAST:  25mL GADAVIST GADOBUTROL 1 MMOL/ML IV SOLN COMPARISON:  Same-day noncontrast CT head, brain MRI 10/07/2020 FINDINGS: Brain: There is an enhancing lesion in the left posterior temporal lobe measuring 1.9 cm by 1.9 cm x 2.2 cm consistent with metastatic disease. There is surrounding vasogenic edema resulting in effacement of the adjacent sulci and slight effacement of the occipital horn of the left lateral ventricle. There is an additional enhancing lesion in the left superior parietal lobule measuring approximately 0.9 cm TV by 1.3 cm cc in the coronal plane. The lesion is not well evaluated in the axial plane due to motion  artifact. There is mild vasogenic edema surrounding this lesion without significant mass effect. No other enhancing lesions are seen. There is no evidence of acute intracranial hemorrhage or infarct. A small focus of susceptibility in the left frontal lobe is nonspecific. There is no midline shift. Vascular: Normal flow voids. Skull and upper cervical spine: There is no definite abnormal signal abnormality, though the upper cervical spine is not well evaluated due to significant motion artifact. Sinuses/Orbits: The paranasal sinuses are clear. T2 hypointensity in the right globe is consistent with tear related to a prior procedure as seen on prior CT. Other: None. IMPRESSION: Two enhancing lesions in the left cerebral hemisphere, the larger in the posterior temporal lobe measuring up to 1.9 cm x 1.9 cm x 2.2 cm most concerning for metastatic disease given the patient's history and multiplicity. There is edema surrounding the lesions,  more so in the left temporal lobe, without midline shift. Electronically Signed   By: Valetta Mole M.D.   On: 12/08/2020 12:09   CT HEAD CODE STROKE WO CONTRAST  Result Date: 12/08/2020 CLINICAL DATA:  Code stroke. EXAM: CT HEAD WITHOUT CONTRAST TECHNIQUE: Contiguous axial images were obtained from the base of the skull through the vertex without intravenous contrast. COMPARISON:  Brain MRI 10/07/2020 FINDINGS: Brain: There is edema in the left temporal lobe posteriorly which appears to spare the overlying gray matter. There is effacement of the surrounding sulci and partial effacement of the occipital horn of the left lateral ventricle. There is dish in all hypodensity in the left superior parietal lobule with sparing of the overlying cortex (5-49). There is no evidence of acute intracranial hemorrhage. There is no extra-axial fluid collection. There is no midline shift. Vascular: There is mild calcification of the intracranial ICAs. There is no dense vessel. Skull: Normal.  Negative for fracture or focal lesion. Sinuses/Orbits: There is air in the right globe which is new since the prior study, likely postprocedural. The imaged paranasal sinuses are clear. Other: None. ASPECTS [facility] Stroke Program Early CT Score) - Ganglionic level infarction (caudate, lentiform nuclei, internal capsule, insula, M1-M3 cortex): 7 - Supraganglionic infarction (M4-M6 cortex): 3 Total score (0-10 with 10 being normal): 10 IMPRESSION: 1. Edema in the left temporal and parietal lobes which appears to spare the overlying cortex is felt unlikely to be related to evolving infarct, and is more concerning for underlying mass lesion given the history of mono melanoma. Recommend MRI of the brain with and without contrast for further evaluation. 2. ASPECTS is 10 3. Air in the right globe is likely postprocedural. These results were called by telephone at the time of interpretation on 12/08/2020 at 9:16 am to provider Dr Theda Sers , who verbally acknowledged these results. Electronically Signed   By: Valetta Mole M.D.   On: 12/08/2020 09:17   CT ANGIO HEAD NECK W WO CM W PERF (CODE STROKE)  Result Date: 12/08/2020 EXAM: CT ANGIOGRAPHY HEAD AND NECK CT PERFUSION BRAIN TECHNIQUE: Multidetector CT imaging of the head and neck was performed using the standard protocol during bolus administration of intravenous contrast. Multiplanar CT image reconstructions and MIPs were obtained to evaluate the vascular anatomy. Carotid stenosis measurements (when applicable) are obtained utilizing NASCET criteria, using the distal internal carotid diameter as the denominator. Multiphase CT imaging of the brain was performed following IV bolus contrast injection. Subsequent parametric perfusion maps were calculated using RAPID software. CONTRAST:  151mL OMNIPAQUE IOHEXOL 350 MG/ML SOLN COMPARISON:  Same day CT head, brain MRI 10/07/2020, CTA head/neck 10/06/2020 FINDINGS: CTA NECK FINDINGS Aortic arch: Standard branching. Imaged  portion shows no evidence of aneurysm or dissection. No significant stenosis of the major arch vessel origins. Right carotid system: No evidence of dissection, stenosis (50% or greater) or occlusion. Left carotid system: No evidence of dissection, stenosis (50% or greater) or occlusion. Vertebral arteries: Codominant. No evidence of dissection, stenosis (50% or greater) or occlusion. Skeleton: There is mild multilevel degenerative change of the cervical spine, most advanced at C5-C6. There is no suspicious osseous lesion. Other neck: The soft tissues are unremarkable. There is no pathologic lymphadenopathy in the neck. Upper chest: The lung apices are clear. Review of the MIP images confirms the above findings CTA HEAD FINDINGS Anterior circulation: There is mild calcification of the bilateral intracranial ICAs without significant stenosis or occlusion. The bilateral MCAs are patent. The bilateral ACAs are patent.  There is no aneurysm. Posterior circulation: The bilateral V4 segments are patent. The basilar artery is patent. The bilateral PCAs are patent. There is no aneurysm. Venous sinuses: Patent. Anatomic variants: There is a prominent posterior communicating artery on the right. Review of the MIP images confirms the above findings CT Brain Perfusion Findings: CBF (<30%) Volume: 55mL Perfusion (Tmax>6.0s) volume: 25mL Mismatch Volume: N/a Infarction Location:N/a IMPRESSION: 1. Patent vasculature of the head and neck with no significant stenosis or occlusion. 2. No infarct core or penumbra identified. Electronically Signed   By: Valetta Mole M.D.   On: 12/08/2020 09:26        Scheduled Meds:  dexamethasone (DECADRON) injection  6 mg Intravenous Q24H   erythromycin   Right Eye BID   levETIRAcetam  500 mg Oral BID   ofloxacin  1 drop Right Eye QID   prednisoLONE acetate  1 drop Right Eye QID   sodium chloride flush  3 mL Intravenous Q12H   Continuous Infusions:  sodium chloride       LOS: 1 day     Time spent: 30min    Domenic Polite, MD [facility]   12/09/2020, 10:39 AM

## 2020-12-09 NOTE — Progress Notes (Signed)
Speech/Language/Cognitive Evaluation    12/09/20 1002  SLP Visit Information  SLP Received On 12/09/20  SLP Time Calculation  SLP Start Time (ACUTE ONLY) 0920  SLP Stop Time (ACUTE ONLY) 0941  SLP Time Calculation (min) (ACUTE ONLY) 21 min  Subjective  Subjective Pt awake and alert, distressed.  Patient/Family Stated Goal pt wanted to eat breakfast  General Information  HPI 65 y.o. F with medical history significant of HLD, IDA, TIA in 09/2020, melanoma of the right hip 2021 stage IB and vitreous hemorrhage of the right eye s/p vitrectomy 12/07/20 who presented to ED for sudden onset nonverbal state and inability to follow commands. Imaging revealed two enhancing lesions in the left posterior temporal lobe and left superior parietal lobule with mild vasogenic edema.  Prior Functional Status  Cognitive/Linguistic Baseline Information not available (suspect WFL's)  Type of Home House   Lives With  (alone?)  Available Help at Discharge Family;Available PRN/intermittently  Pain Assessment  Pain Assessment Faces  Faces Pain Scale 2  Pain Location general distress  Pain Descriptors / Indicators Discomfort  Pain Intervention(s) Limited activity within patient's tolerance;Monitored during session  Oral Motor/Sensory Function  Overall Oral Motor/Sensory Function WFL  Cognition  Overall Cognitive Status Impaired/Different from baseline  Arousal/Alertness Awake/alert  Orientation Level Oriented to person;Disoriented to place;Oriented to situation  Attention Sustained  Sustained Attention Appears intact  Memory Appears intact  Awareness Impaired  Awareness Impairment Emergent impairment  Executive Function Self Monitoring;Self Correcting  Self Monitoring Impaired  Self Monitoring Impairment Verbal basic  Self Correcting Impaired  Self Correcting Impairment Verbal basic  Behaviors Perseveration  Safety/Judgment Impaired  Auditory Comprehension  Overall Auditory Comprehension Impaired   Yes/No Questions X  Basic Biographical Questions 0-25% accurate  Basic Immediate Environment Questions 0-24% accurate  Commands X  One Step Basic Commands 25-49% accurate  Conversation Simple  EffectiveTechniques Repetition;Slowed speech;Stressing words;Visual/Gestural cues  Visual Recognition/Discrimination  Discrimination X  Other Visual Recogniton/Discrimination Comments States she cannot see objects, but sometimes able to describe objects when she attends to them. Obtaining object attention often difficult.  Reading Comprehension  Reading Status Not tested  Expression  Primary Mode of Expression Verbal  Verbal Expression  Overall Verbal Expression Impaired  Initiation No impairment  Level of Generative/Spontaneous Verbalization Conversation  Repetition Impaired  Level of Impairment Phrase level  Naming Impairment  Confrontation Impaired  Other Naming Comments Pt ability to understand prompt ("What is this object called?") impacted task performance.  Verbal Errors Semantic paraphasias;Perseveration;Other (comment) (aware of perseveration)  Pragmatics No impairment  Non-Verbal Means of Communication Not applicable  Written Expression  Dominant Hand Right  Written Expression Not tested  Motor Speech  Overall Motor Speech Appears within functional limits for tasks assessed  Respiration WFL  Phonation Normal  Resonance [facility]  Articulation WFL  Intelligibility Intelligible  Motor Planning [facility]  Motor Speech Errors NA  SLP - End of Session  Patient left in bed;with call bell/phone within reach;with family/visitor present  Nurse Communication Treatment plan  Assessment  Clinical Impression Statement (ACUTE ONLY) Pt was seen for a speech langauge evaluation. Overall, pt presents with moderate-severe receptive and moderate expressive aphasia with cognitive impairments in attention and awareness. Oral mechanism exam unremarkable, although strength was assumed secondary to pt  deficit in following commands. SLP administered sections of the bedside WAB; pt demonstrated difficulty understanding content and yes/no questions despite multiple repetitions, verbal, and visual cues. Pt repetition intact at the word level but impaired for larger utterance lengths. Pt oriented to  self and situation but disoriented to place ([place]). Pt demonstrated reduced awareness of language deficits; aware of perseveration but unaware of syntacic/semantic errors and receptive impairments. Output fluent but c/b frequent semantic paraphasias and syntactical errors (I have hungry, etc.) Further cognitive evaluation impeded by language impairments. SLP will continue to follow acutely for therapy targeting language facilitation, compensatory strategies, and carepartner/patient education.  SLP Recommendation/Assessment Patient needs continued Speech Lanaguage Pathology Services  SLP Visit Diagnosis Cognitive communication deficit (R41.841);Aphasia (R47.01)  Problem List Auditory comprehension;Verbal expression;Attention;Executive Functioning  Plan  Speech Therapy Frequency (ACUTE ONLY) min 2x/week  Duration 2 weeks  Treatment/Interventions Language facilitation;Internal/external aids;Cognitive reorganization;Functional tasks;Multimodal communcation approach;SLP instruction and feedback;Compensatory strategies;Patient/family education  Potential to Achieve Goals (ACUTE ONLY) Fair  Potential Considerations (ACUTE ONLY) Severity of impairments;Medical prognosis  SLP Recommendations  Follow up Recommendations Other (comment) (tbd)  SLP Equipment None recommended by SLP  Individuals Consulted  Consulted and Agree with Results and Recommendations Family member/caregiver  Family Member Consulted  son  SLP Goals  Progress/Goals/Alternative treatment plan discussed with pt/caregiver and they Agree  SLP Evaluations  $ SLP Speech Visit 1 Visit  SLP Evaluations  $ SLP EVAL LANGUAGE/SOUND PRODUCTION 1  Procedure   Dewitt Rota, SLP-Student

## 2020-12-09 NOTE — Plan of Care (Signed)
Pt is alert, expressive aphasia. Pts Right eye is red. Opthamologist, Dr. Posey Pronto came to see the patient. Pt follows commands at times, but over all does not follow commands when spoken to. Pt is able to speak clear sentences at times. Pt took medications whole with water. Pt has purewick.  Problem: Education: Goal: Knowledge of General Education information will improve Description: Including pain rating scale, medication(s)/side effects and non-pharmacologic comfort measures 12/09/2020 0259 by Allayne Stack, RN Outcome: Progressing 12/09/2020 0219 by Allayne Stack, RN Outcome: Progressing   Problem: Health Behavior/Discharge Planning: Goal: Ability to manage health-related needs will improve 12/09/2020 0259 by Allayne Stack, RN Outcome: Progressing 12/09/2020 0219 by Allayne Stack, RN Outcome: Progressing   Problem: Clinical Measurements: Goal: Ability to maintain clinical measurements within normal limits will improve 12/09/2020 0259 by Allayne Stack, RN Outcome: Progressing 12/09/2020 0219 by Allayne Stack, RN Outcome: Progressing Goal: Will remain free from infection 12/09/2020 0259 by Allayne Stack, RN Outcome: Progressing 12/09/2020 0219 by Allayne Stack, RN Outcome: Progressing Goal: Diagnostic test results will improve 12/09/2020 0259 by Allayne Stack, RN Outcome: Progressing 12/09/2020 0219 by Allayne Stack, RN Outcome: Progressing Goal: Respiratory complications will improve 12/09/2020 0259 by Allayne Stack, RN Outcome: Progressing 12/09/2020 0219 by Allayne Stack, RN Outcome: Progressing Goal: Cardiovascular complication will be avoided 12/09/2020 0259 by Allayne Stack, RN Outcome: Progressing 12/09/2020 0219 by Allayne Stack, RN Outcome: Progressing   Problem: Activity: Goal: Risk for activity intolerance will decrease 12/09/2020 0259 by Allayne Stack, RN Outcome:  Progressing 12/09/2020 0219 by Allayne Stack, RN Outcome: Progressing   Problem: Nutrition: Goal: Adequate nutrition will be maintained 12/09/2020 0259 by Allayne Stack, RN Outcome: Progressing 12/09/2020 0219 by Allayne Stack, RN Outcome: Progressing   Problem: Coping: Goal: Level of anxiety will decrease 12/09/2020 0259 by Allayne Stack, RN Outcome: Progressing 12/09/2020 0219 by Allayne Stack, RN Outcome: Progressing   Problem: Elimination: Goal: Will not experience complications related to bowel motility 12/09/2020 0259 by Allayne Stack, RN Outcome: Progressing 12/09/2020 0219 by Allayne Stack, RN Outcome: Progressing Goal: Will not experience complications related to urinary retention 12/09/2020 0259 by Allayne Stack, RN Outcome: Progressing 12/09/2020 0219 by Allayne Stack, RN Outcome: Progressing   Problem: Pain Managment: Goal: General experience of comfort will improve 12/09/2020 0259 by Allayne Stack, RN Outcome: Progressing 12/09/2020 0219 by Allayne Stack, RN Outcome: Progressing   Problem: Safety: Goal: Ability to remain free from injury will improve 12/09/2020 0259 by Allayne Stack, RN Outcome: Progressing 12/09/2020 0219 by Allayne Stack, RN Outcome: Progressing   Problem: Skin Integrity: Goal: Risk for impaired skin integrity will decrease 12/09/2020 0259 by Allayne Stack, RN Outcome: Progressing 12/09/2020 0219 by Allayne Stack, RN Outcome: Progressing

## 2020-12-10 DIAGNOSIS — D496 Neoplasm of unspecified behavior of brain: Secondary | ICD-10-CM

## 2020-12-10 DIAGNOSIS — Z85820 Personal history of malignant melanoma of skin: Secondary | ICD-10-CM | POA: Diagnosis not present

## 2020-12-10 LAB — CBC
HCT: 36.5 % (ref 36.0–46.0)
Hemoglobin: 11.3 g/dL — ABNORMAL LOW (ref 12.0–15.0)
MCH: 23.4 pg — ABNORMAL LOW (ref 26.0–34.0)
MCHC: 31 g/dL (ref 30.0–36.0)
MCV: 75.6 fL — ABNORMAL LOW (ref 80.0–100.0)
Platelets: 435 10*3/uL — ABNORMAL HIGH (ref 150–400)
RBC: 4.83 MIL/uL (ref 3.87–5.11)
RDW: 17.8 % — ABNORMAL HIGH (ref 11.5–15.5)
WBC: 11.8 10*3/uL — ABNORMAL HIGH (ref 4.0–10.5)
nRBC: 0 % (ref 0.0–0.2)

## 2020-12-10 LAB — BASIC METABOLIC PANEL
Anion gap: 9 (ref 5–15)
BUN: 10 mg/dL (ref 8–23)
CO2: 25 mmol/L (ref 22–32)
Calcium: 9.6 mg/dL (ref 8.9–10.3)
Chloride: 101 mmol/L (ref 98–111)
Creatinine, Ser: 0.6 mg/dL (ref 0.44–1.00)
GFR, Estimated: >60 mL/min (ref >60)
Glucose, Bld: 139 mg/dL — ABNORMAL HIGH (ref 70–99)
Potassium: 4.2 mmol/L (ref 3.5–5.1)
Sodium: 135 mmol/L (ref 135–145)

## 2020-12-10 LAB — LACTATE DEHYDROGENASE: LDH: 118 U/L (ref 98–192)

## 2020-12-10 MED ORDER — SODIUM CHLORIDE 0.9 % IV SOLN
510.0000 mg | Freq: Once | INTRAVENOUS | Status: AC
  Administered 2020-12-10: 510 mg via INTRAVENOUS
  Filled 2020-12-10: qty 17

## 2020-12-10 NOTE — Progress Notes (Addendum)
PROGRESS NOTE    Ariel [name]  QIO:[id number] DOB: [birth year] DOA: 12/08/2020 PCP: [name], NP  Brief Narrative: 65 year old female with history of hypertension, iron deficiency anemia, TIA, history of melanoma of right hip which was resected in 8/21,, history of vitreous hemorrhage, just underwent right eye vitrectomy, subconjunctival hemorrhage was brought to the ED with acute onset confusion, speech deficits, inability to follow commands and blank stares,, symptoms started today-admission. -MRI brain noted 2 enhancing lesions in the left cerebral hemisphere larger in the posterior temporal lobe concerning for metastatic disease, edema was noted surrounding the lesions. -Seen by neurosurgery, neuro-oncology was consulted, stereotactic radiosurgery was recommended   Assessment & Plan:   Brain metastasis Suspected melanoma primary, had melanoma resected from right hip in 09/2019 -was stage I disease at the time of diagnosis, did not require additional therapy beyond excision -Neuro oncology Dr. Mickeal Skinner was consulted -Neurosurgery Dr. Marcello Moores following, recommended stereotactic radiosurgery,  -radiation oncology team consulted, underwent CT abdomen pelvis to assess for tissue biopsy, they will discuss her case at tumor board on Monday, on account of ongoing confusion although better not stable for discharge home -Oncology Dr. Marin Olp following -CT noted retroperitoneal lymphadenopathy, discussed with oncology, will request IR evaluation for needle biopsy, unclear if this would be accessible -Continue Decadron -Started on low-dose Keppra for seizure prophylaxis  Aphasia, expressive and receptive Confusion -Secondary to above, improving, monitor with steroids, Keppra  Right eye vitreous hemorrhage -Status post right vitrectomy and endolaser on 12/08/2010 by Dr. Posey Pronto -With subconjunctival hemorrhage -Continue Pred forte, ofloxacin, erythromycin ophthalmic ointment to right  eye -Follow-up with Dr. Posey Pronto   DVT prophylaxis: SCDs Code Status: Full code Family Communication: Discussed with son at bedside Disposition Plan:  Status is: Inpatient  Remains inpatient appropriate because:Inpatient level of care appropriate due to severity of illness  Dispo: The patient is from: Home              Anticipated d/c is to: Possibly SNF              Patient currently is not medically stable to d/c.   Difficult to place patient No   Consultants:  Neurosurgery Neurooncology Dr. Mickeal Skinner Radiation oncology  Procedures:   Antimicrobials:    Subjective: -Pleasantly confused, sitting up in bed  Objective: Vitals:   12/09/20 1513 12/09/20 2345 12/10/20 0726 12/10/20 1200  BP: 120/63 (!) 161/85 (!) 145/77 (!) 121/56  Pulse: 83 86 84 83  Resp: 14 19 17 20   Temp: 97.8 F (36.6 C) 98.2 F (36.8 C) (!) 97.5 F (36.4 C) 97.7 F (36.5 C)  TempSrc: Oral Oral Oral Oral  SpO2: 97% 96% 98% 99%  Weight:      Height:        Intake/Output Summary (Last 24 hours) at 12/10/2020 1230 Last data filed at 12/09/2020 1745 Gross per 24 hour  Intake --  Output 600 ml  Net -600 ml   Filed Weights   12/08/20 0800 12/08/20 1018  Weight: 64.8 kg 64.8 kg    Examination:  General exam: Pleasant middle-aged female sitting up in bed, awake alert, oriented to self and partly to place, expressive and receptive aphasia, less confused than yesterday HEENT: No JVD CVS: S1-S2, regular rate rhythm Lungs: Clear bilaterally Abdomen: Soft, nontender, bowel sounds present Extremities: No edema  Neuro: Positive aphasia, strength in upper and lower extremities within normal limits  Psychiatry: Poor insight and judgment    Data Reviewed:   CBC: Recent Labs  Lab 12/08/20 0855  12/08/20 0857 12/09/20 0400 12/10/20 0303  WBC 10.0  --  12.2* 11.8*  NEUTROABS 7.5  --   --   --   HGB 10.4* 11.6* 10.8* 11.3*  HCT 34.7* 34.0* 35.5* 36.5  MCV 78.9*  --  75.9* 75.6*  PLT 425*  --   439* 093*   Basic Metabolic Panel: Recent Labs  Lab 12/08/20 0855 12/08/20 0857 12/09/20 0400 12/10/20 0303  NA 133* 135 135 135  K 4.2 4.2 3.9 4.2  CL 99 98 101 101  CO2 26  --  23 25  GLUCOSE 131* 129* 113* 139*  BUN 11 12 9 10   CREATININE 0.62 0.50 0.59 0.60  CALCIUM 9.5  --  9.8 9.6   GFR: Estimated Creatinine Clearance: 64.8 mL/min (by C-G formula based on SCr of 0.6 mg/dL). Liver Function Tests: Recent Labs  Lab 12/08/20 0855  AST 28  ALT 39  ALKPHOS 84  BILITOT 0.5  PROT 8.1  ALBUMIN 2.8*   No results for input(s): LIPASE, AMYLASE in the last 168 hours. No results for input(s): AMMONIA in the last 168 hours. Coagulation Profile: Recent Labs  Lab 12/08/20 0855  INR 1.0   Cardiac Enzymes: No results for input(s): CKTOTAL, CKMB, CKMBINDEX, TROPONINI in the last 168 hours. BNP (last 3 results) No results for input(s): PROBNP in the last 8760 hours. HbA1C: No results for input(s): HGBA1C in the last 72 hours. CBG: Recent Labs  Lab 12/08/20 0852  GLUCAP 125*   Lipid Profile: No results for input(s): CHOL, HDL, LDLCALC, TRIG, CHOLHDL, LDLDIRECT in the last 72 hours. Thyroid Function Tests: Recent Labs    12/09/20 0400  TSH 0.783   Anemia Panel: No results for input(s): VITAMINB12, FOLATE, FERRITIN, TIBC, IRON, RETICCTPCT in the last 72 hours. Urine analysis:    Component Value Date/Time   COLORURINE YELLOW 11/30/2020 1218   APPEARANCEUR CLEAR 11/30/2020 1218   LABSPEC <=1.005 (A) 11/30/2020 1218   PHURINE 6.5 11/30/2020 1218   GLUCOSEU NEGATIVE 11/30/2020 1218   HGBUR SMALL (A) 11/30/2020 1218   BILIRUBINUR NEGATIVE 11/30/2020 1218   BILIRUBINUR neg 06/15/2020 1333   KETONESUR NEGATIVE 11/30/2020 1218   PROTEINUR NEGATIVE 10/06/2020 1844   UROBILINOGEN 0.2 11/30/2020 1218   NITRITE NEGATIVE 11/30/2020 1218   LEUKOCYTESUR NEGATIVE 11/30/2020 1218   Sepsis Labs: @LABRCNTIP (procalcitonin:4,lacticidven:4)  ) Recent Results (from the past  240 hour(s))  Resp Panel by RT-PCR (Flu A&B, Covid)     Status: None   Collection Time: 12/08/20  9:17 AM  Result Value Ref Range Status   SARS Coronavirus 2 by RT PCR NEGATIVE NEGATIVE Final    Comment: (NOTE) SARS-CoV-2 target nucleic acids are NOT DETECTED.  The SARS-CoV-2 RNA is generally detectable in upper respiratory specimens during the acute phase of infection. The lowest concentration of SARS-CoV-2 viral copies this assay can detect is 138 copies/mL. A negative result does not preclude SARS-Cov-2 infection and should not be used as the sole basis for treatment or other patient management decisions. A negative result may occur with  improper specimen collection/handling, submission of specimen other than nasopharyngeal swab, presence of viral mutation(s) within the areas targeted by this assay, and inadequate number of viral copies(<138 copies/mL). A negative result must be combined with clinical observations, patient history, and epidemiological information. The expected result is Negative.  Fact Sheet for Patients:  EntrepreneurPulse.com.au  Fact Sheet for Healthcare Providers:  IncredibleEmployment.be  This test is no t yet approved or cleared by the [place] and  has been authorized for detection and/or diagnosis of SARS-CoV-2 by FDA under an Emergency Use Authorization (EUA). This EUA will remain  in effect (meaning this test can be used) for the duration of the COVID-19 declaration under Section 564(b)(1) of the Act, 21 U.S.C.section 360bbb-3(b)(1), unless the authorization is terminated  or revoked sooner.       Influenza A by PCR NEGATIVE NEGATIVE Final   Influenza B by PCR NEGATIVE NEGATIVE Final    Comment: (NOTE) The Xpert Xpress SARS-CoV-2/FLU/RSV plus assay is intended as an aid in the diagnosis of influenza from Nasopharyngeal swab specimens and should not be used as a sole basis for treatment. Nasal washings  and aspirates are unacceptable for Xpert Xpress SARS-CoV-2/FLU/RSV testing.  Fact Sheet for Patients: EntrepreneurPulse.com.au  Fact Sheet for Healthcare Providers: IncredibleEmployment.be  This test is not yet approved or cleared by the [place] FDA and has been authorized for detection and/or diagnosis of SARS-CoV-2 by FDA under an Emergency Use Authorization (EUA). This EUA will remain in effect (meaning this test can be used) for the duration of the COVID-19 declaration under Section 564(b)(1) of the Act, 21 U.S.C. section 360bbb-3(b)(1), unless the authorization is terminated or revoked.  Performed at [facility], [place] [address] [place], [place] [address]          Radiology Studies: CT CHEST ABDOMEN PELVIS W CONTRAST  Result Date: 12/09/2020 CLINICAL DATA:  Melanoma of the right hip. Now with brain metastases. Staging. EXAM: CT CHEST, ABDOMEN, AND PELVIS WITH CONTRAST TECHNIQUE: Multidetector CT imaging of the chest, abdomen and pelvis was performed following the standard protocol during bolus administration of intravenous contrast. CONTRAST:  47mL OMNIPAQUE IOHEXOL 350 MG/ML SOLN COMPARISON:  None. FINDINGS: CT CHEST FINDINGS Cardiovascular: The heart size is normal. No substantial pericardial effusion. Mediastinum/Nodes: No mediastinal lymphadenopathy. There is no hilar lymphadenopathy. The esophagus has normal imaging features. There is no axillary lymphadenopathy. Lungs/Pleura: Minimal biapical pleuroparenchymal scarring. No suspicious pulmonary nodule or mass. No focal airspace consolidation. No pleural effusion. Musculoskeletal: No worrisome lytic or sclerotic osseous abnormality. CT ABDOMEN PELVIS FINDINGS Hepatobiliary: No suspicious focal abnormality within the liver parenchyma. There is no evidence for gallstones, gallbladder wall thickening, or pericholecystic fluid. No intrahepatic or extrahepatic biliary dilation.  Pancreas: No focal mass lesion. No dilatation of the main duct. No intraparenchymal cyst. No peripancreatic edema. Spleen: 8 mm hypoattenuating lesion noted posterior spleen on 21/3 with 9 mm hypoattenuating lesion anterior spleen on 22/3. Subtle tiny hypoattenuating inferior splenic lesion on 30/3. Adrenals/Urinary Tract: 17 mm right adrenal nodule evident. Kidneys unremarkable No evidence for hydroureter. The urinary bladder appears normal for the degree of distention. Stomach/Bowel: Stomach is unremarkable. No gastric wall thickening. No evidence of outlet obstruction. Duodenum is normally positioned as is the ligament of Treitz. No small bowel wall thickening. No small bowel dilatation. The terminal ileum is normal. No gross colonic mass. No colonic wall thickening. Vascular/Lymphatic: No abdominal aortic aneurysm relatively bulky lymphadenopathy is seen in the gastrohepatic ligament and celiac axis. 16 mm short axis node visible on 21/3. 16 mm lesion noted on 20/3. 12 mm short axis lymph node identified in the portal caval space on 27/3. Retroperitoneal lymphadenopathy evident with 13 mm short axis left para-aortic node on 30/3. No pelvic sidewall lymphadenopathy. Somewhat bulky Reproductive: The uterus is unremarkable.  There is no adnexal mass. Other: No intraperitoneal free fluid. Musculoskeletal: No worrisome lytic or sclerotic osseous abnormality. IMPRESSION: 1. Prominent lymphadenopathy in the gastrohepatic ligament and celiac axis with  retroperitoneal lymphadenopathy. Imaging features compatible with metastatic disease. PET-CT may prove helpful to further evaluate, as clinically warranted. 2. 17 mm right adrenal nodule, indeterminate, but metastatic disease not excluded. 3. Tiny hypoattenuating splenic lesions. Attention on follow-up recommended. Electronically Signed   By: Misty Stanley M.D.   On: 12/09/2020 21:48     Scheduled Meds:  dexamethasone (DECADRON) injection  4 mg Intravenous Q12H    erythromycin   Right Eye BID   levETIRAcetam  500 mg Oral BID   ofloxacin  1 drop Right Eye QID   prednisoLONE acetate  1 drop Right Eye QID   sodium chloride flush  3 mL Intravenous Q12H   Continuous Infusions:  sodium chloride     ferumoxytol 510 mg (12/10/20 1219)     LOS: 2 days    Time spent: 20min    Domenic Polite, MD [facility]   12/10/2020, 12:30 PM

## 2020-12-10 NOTE — Progress Notes (Signed)
IR was requested for image guided retroperitoneal lymph node biopsy.   Case was reviewed by Dr. Pascal Lux.  Retroperitoneal lymph node biopsy is extremely challenging, patient will need to undergo PET scan to confirm the RP LN are hypermetabolic.     Ordering provider notified.   Will delete the IR eval order.  Please call IR for questions and concerns.   Armando Gang Skylinn Vialpando PA-C 12/10/2020 2:23 PM

## 2020-12-10 NOTE — Plan of Care (Signed)

## 2020-12-10 NOTE — Consult Note (Signed)
@LOGO @ Hematology and Oncology Follow Up Visit  Ariel [name] [id number] [birth year] 65 y.o. 12/10/2020   Principle Diagnosis:  Brain metastasis-likely recurrent melanoma.  Current Therapy:   Patient really cannot give too much history.     Interim History:  Ariel [name] is a very nice 65 year old white female.  After saw her for the first time a week ago.  I am absolutely shocked that she is in the hospital.  I saw her because of iron deficiency anemia.  However, she has a history of melanoma.  She had a melanoma removed from the right hip back in August 2021.  This was a thin melanoma.  It was 0.6 mm.  Sentinel lymph nodes were negative.  I had seen her and she was doing fine.  She had no neurological issues.  There is no headache.  She had no visual trouble.  She was very eloquent.  She apparently developed some aphasia.  She was at home.  I am sure she actually had a seizure.  She subsequently was brought to the emergency room.  She had an MRI of the brain, ultimately,.  This showed 2 brain lesions..  She had a CT of the body last night.  This showed likely adenopathy.  I have to believe that this is going to end up being melanoma.  She still has some confusion.  She is very mild.  She has some difficulty with word finding.  She is on Keppra and Decadron right now.  She needs to have a biopsy.  Hopefully this will be done.  We will have to make sure that tissue was obtained so that we can get the BRAF mutation.  Otherwise, she seems to be in great shape.  She may have had some eye surgery.  She does have a subconjunctival hemorrhage on the right eye.  Her appetite has been okay.  Overall, I would say performance status is probably ECOG 1.   Medications:  Current Facility-Administered Medications:    0.9 %  sodium chloride infusion, 250 mL, Intravenous, PRN, Ariel Flaming, MD   acetaminophen (TYLENOL) tablet 650 mg, 650 mg, Oral, Q6H PRN, 650 mg at 12/09/20 2150 **OR**  acetaminophen (TYLENOL) suppository 650 mg, 650 mg, Rectal, Q6H PRN, Ariel Flaming, MD   dexamethasone (DECADRON) injection 4 mg, 4 mg, Intravenous, Q12H, Ariel Polite, MD, 4 mg at 12/09/20 2150   erythromycin ophthalmic ointment, , Right Eye, BID, Ariel Mullet, MD, Given at 12/09/20 2155   levETIRAcetam (KEPPRA) tablet 500 mg, 500 mg, Oral, BID, Ariel [name], Ariel [name], Ariel [name], 500 mg at 12/09/20 2150   ofloxacin (OCUFLOX) 0.3 % ophthalmic solution 1 drop, 1 drop, Right Eye, QID, Ariel Mullet, MD, 1 drop at 12/09/20 2145   prednisoLONE acetate (PRED FORTE) 1 % ophthalmic suspension 1 drop, 1 drop, Right Eye, QID, Ariel Mullet, MD, 1 drop at 12/09/20 2059   sodium chloride flush (NS) 0.9 % injection 3 mL, 3 mL, Intravenous, Q12H, Ariel Flaming, MD, 3 mL at 12/09/20 2151   sodium chloride flush (NS) 0.9 % injection 3 mL, 3 mL, Intravenous, PRN, Ariel Flaming, MD  Allergies:  Allergies  Allergen Reactions   Codeine Nausea Only   Plavix [Clopidogrel]     Hives,lip swelling     Past Medical History, Surgical history, Social history, and Family History were reviewed and updated.  Review of Systems: Review of Systems  Constitutional: Negative.   HENT:  Negative.    Eyes: Negative.   Respiratory: Negative.    Cardiovascular: Negative.  Gastrointestinal: Negative.   Endocrine: Negative.   Genitourinary: Negative.    Musculoskeletal: Negative.   Skin: Negative.   Neurological:  Positive for dizziness, headaches and speech difficulty.  Psychiatric/Behavioral:  Positive for confusion.    Physical Exam:  height is 5' 6"  (1.676 m) and weight is 142 lb 13.7 oz (64.8 kg). Her oral temperature is 98.2 F (36.8 C). Her blood pressure is 161/85 (abnormal) and her pulse is 86. Her respiration is 19 and oxygen saturation is 96%.   Wt Readings from Last 3 Encounters:  12/08/20 142 lb 13.7 oz (64.8 kg)  12/07/20 142 lb (64.4 kg)  12/01/20 143 lb (64.9 kg)    Physical Exam Vitals  reviewed.  HENT:     Head: Normocephalic and atraumatic.  Eyes:     Pupils: Pupils are equal, round, and reactive to light.  Cardiovascular:     Rate and Rhythm: Normal rate and regular rhythm.     Heart sounds: Normal heart sounds.  Pulmonary:     Effort: Pulmonary effort is normal.     Breath sounds: Normal breath sounds.  Abdominal:     General: Bowel sounds are normal.     Palpations: Abdomen is soft.  Musculoskeletal:        General: No tenderness or deformity. Normal range of motion.     Cervical back: Normal range of motion.     Comments: She has a wide local excision scar on the right hip.  There is no erythema.  There is no nodularity.  Lymphadenopathy:     Cervical: No cervical adenopathy.  Skin:    General: Skin is warm and dry.     Findings: No erythema or rash.  Neurological:     Mental Status: She is alert and oriented to person, place, and time.     Comments: She is alert.  She is really not oriented.  Again she is very pleasant.  She has some difficulty with word finding.  She has little bit of disorientation.  Psychiatric:        Behavior: Behavior normal.        Thought Content: Thought content normal.        Judgment: Judgment normal.     Lab Results  Component Value Date   WBC 11.8 (H) 12/10/2020   HGB 11.3 (L) 12/10/2020   HCT 36.5 12/10/2020   MCV 75.6 (L) 12/10/2020   PLT 435 (H) 12/10/2020   @LASTCHEMISTRY @    Impression and Plan: [name] is a very charming 65 year old white female.  Shockingly, I suspect that the melanoma has recurred.  There really is With the original pathology that would be concerning.  She had a thin melanoma.  There were 3 negative lymph nodes.  This is truly the "dark side" of melanoma.  It can come back anywhere with any stage.  We have to make a diagnosis.  She needs to have a biopsy.  I think she is going to have a stereotactic biopsy of one of the brain mets.  It does not look like she has a lot of systemic  disease.  As such, I wonder if he may not be feasible to just resect out the 2 brain lesions.  I am not sure if this is technically feasible.  I am sure she will need radiosurgery for the brain mets.  Systemic therapy will be based upon the pathology.  I saw her for iron deficiency when she was in the office.  We will give  her some IV iron while she is in the hospital.  I know she will get fantastic care from all the staff up on [name].  [name], MD  Romans 5:3-5   Volanda Napoleon, MD 10/14/20226:05 AM

## 2020-12-10 NOTE — Plan of Care (Signed)
Pt is alert x 1 at times. Pt is has expressive and receptive aphasia. Pt becomes anxious and restless at times. Pt has had night sweats. Pt has purewick in place. Pt follows commands at times other times just repeats whats being said.    Problem: Education: Goal: Knowledge of General Education information will improve Description: Including pain rating scale, medication(s)/side effects and non-pharmacologic comfort measures Outcome: Progressing   Problem: Health Behavior/Discharge Planning: Goal: Ability to manage health-related needs will improve Outcome: Progressing   Problem: Clinical Measurements: Goal: Ability to maintain clinical measurements within normal limits will improve Outcome: Progressing Goal: Will remain free from infection Outcome: Progressing Goal: Diagnostic test results will improve Outcome: Progressing Goal: Respiratory complications will improve Outcome: Progressing Goal: Cardiovascular complication will be avoided Outcome: Progressing   Problem: Activity: Goal: Risk for activity intolerance will decrease Outcome: Progressing   Problem: Coping: Goal: Level of anxiety will decrease Outcome: Progressing   Problem: Elimination: Goal: Will not experience complications related to bowel motility Outcome: Progressing Goal: Will not experience complications related to urinary retention Outcome: Progressing   Problem: Pain Managment: Goal: General experience of comfort will improve Outcome: Progressing   Problem: Safety: Goal: Ability to remain free from injury will improve Outcome: Progressing   Problem: Skin Integrity: Goal: Risk for impaired skin integrity will decrease Outcome: Progressing

## 2020-12-11 LAB — CBC
HCT: 36.1 % (ref 36.0–46.0)
Hemoglobin: 10.9 g/dL — ABNORMAL LOW (ref 12.0–15.0)
MCH: 23 pg — ABNORMAL LOW (ref 26.0–34.0)
MCHC: 30.2 g/dL (ref 30.0–36.0)
MCV: 76.3 fL — ABNORMAL LOW (ref 80.0–100.0)
Platelets: 460 10*3/uL — ABNORMAL HIGH (ref 150–400)
RBC: 4.73 MIL/uL (ref 3.87–5.11)
RDW: 18 % — ABNORMAL HIGH (ref 11.5–15.5)
WBC: 10.4 10*3/uL (ref 4.0–10.5)
nRBC: 0 % (ref 0.0–0.2)

## 2020-12-11 LAB — BASIC METABOLIC PANEL
Anion gap: 8 (ref 5–15)
BUN: 14 mg/dL (ref 8–23)
CO2: 25 mmol/L (ref 22–32)
Calcium: 9.7 mg/dL (ref 8.9–10.3)
Chloride: 99 mmol/L (ref 98–111)
Creatinine, Ser: 0.58 mg/dL (ref 0.44–1.00)
GFR, Estimated: >60 mL/min (ref >60)
Glucose, Bld: 122 mg/dL — ABNORMAL HIGH (ref 70–99)
Potassium: 3.9 mmol/L (ref 3.5–5.1)
Sodium: 132 mmol/L — ABNORMAL LOW (ref 135–145)

## 2020-12-11 MED ORDER — SENNOSIDES-DOCUSATE SODIUM 8.6-50 MG PO TABS
1.0000 | ORAL_TABLET | Freq: Two times a day (BID) | ORAL | Status: DC
  Administered 2020-12-11 – 2020-12-16 (×10): 1 via ORAL
  Filled 2020-12-11 (×10): qty 1

## 2020-12-11 NOTE — Progress Notes (Addendum)
PROGRESS NOTE    Alle Difabio  YSA:[id number] DOB: 05/06/1955 DOA: 12/08/2020 PCP: [name], NP  Brief Narrative: 65 year old female with history of hypertension, iron deficiency anemia, TIA, history of melanoma of right hip which was resected in 8/21,, history of vitreous hemorrhage, just underwent right eye vitrectomy, subconjunctival hemorrhage was brought to the ED with acute onset confusion, speech deficits, inability to follow commands and blank stares,, symptoms started today-admission. -MRI brain noted 2 enhancing lesions in the left cerebral hemisphere larger in the posterior temporal lobe concerning for metastatic disease, edema was noted surrounding the lesions. -Seen by neurosurgery, neuro-oncology was consulted, stereotactic radiosurgery was recommended   Assessment & Plan:   Brain metastasis Suspected melanoma primary, had melanoma resected from right hip in 09/2019 -was stage I disease at the time of diagnosis, did not require additional therapy beyond excision -Neurosurgery Dr. Marcello Moores following, recommended stereotactic radiosurgery,  -radiation oncology team consulted, underwent CT abdomen pelvis to assess for tissue biopsy, they will discuss her case at tumor board on Monday, on account of ongoing confusion although better not stable for discharge home -Oncology Dr. Marin Olp following -CT noted retroperitoneal lymphadenopathy,, discussed with interventional radiology, they felt retroperitoneal lymph node biopsy would be extremely challenging and difficult to access, recommended PET scan to be done prior to attempts at biopsy. -Also called and discussed with neurosurgery Dr. Marcello Moores 10/14, due to location of brain mets in her speech center, felt biopsy would be relatively risky, also recommended PET scan. -Await further discussion at tumor board tomorrow -Continue Decadron and low-dose Keppra -May need transfer to [facility] if XRT planned soon -Ambulate, PT  OT  Aphasia, expressive and receptive Confusion -Secondary to above, improving, monitor with steroids, Keppra  Right eye vitreous hemorrhage -Status post right vitrectomy and endolaser on 12/08/2010 by Dr. Posey Pronto -With subconjunctival hemorrhage -Continue Pred forte, ofloxacin, erythromycin ophthalmic ointment to right eye -Follow-up with Dr. Posey Pronto   DVT prophylaxis: SCDs Code Status: Full code Family Communication: Discussed with son and daughter at bedside Disposition Plan:  Status is: Inpatient  Remains inpatient appropriate because:Inpatient level of care appropriate due to severity of illness  Dispo: The patient is from: Home              Anticipated d/c is to: SNF              Patient currently is not medically stable to d/c.   Difficult to place patient No   Consultants:  Neurosurgery Neurooncology Dr. Mickeal Skinner Radiation oncology Oncology Dr. Marin Olp Interventional radiology  Procedures:   Antimicrobials:    Subjective: -Feels okay, denies any specific complaints, more lucid and appropriate  Objective: Vitals:   12/10/20 1952 12/11/20 0023 12/11/20 0830 12/11/20 1128  BP: (!) 112/58 100/62  120/69  Pulse: 64 62  86  Resp: 13 15  (!) 24  Temp:   98.2 F (36.8 C) 98.2 F (36.8 C)  TempSrc:   Oral Oral  SpO2: 95% 93%  100%  Weight:      Height:        Intake/Output Summary (Last 24 hours) at 12/11/2020 1328 Last data filed at 12/11/2020 3235 Gross per 24 hour  Intake 3 ml  Output --  Net 3 ml   Filed Weights   12/08/20 0800 12/08/20 1018  Weight: 64.8 kg 64.8 kg    Examination:  General exam: Pleasant middle-aged female sitting up in bed, awake alert, oriented to self and place, partly to time, improvement in aphasia noted HEENT: No  JVD CVS: S1-S2, regular rate rhythm Lungs: Clear bilaterally Abdomen: Soft, nontender, bowel sounds present Extremities: No edema Neuro: Positive aphasia, strength in upper and lower extremities within normal  limits  Psychiatry: Poor insight and judgment    Data Reviewed:   CBC: Recent Labs  Lab 12/08/20 0855 12/08/20 0857 12/09/20 0400 12/10/20 0303 12/11/20 0536  WBC 10.0  --  12.2* 11.8* 10.4  NEUTROABS 7.5  --   --   --   --   HGB 10.4* 11.6* 10.8* 11.3* 10.9*  HCT 34.7* 34.0* 35.5* 36.5 36.1  MCV 78.9*  --  75.9* 75.6* 76.3*  PLT 425*  --  439* 435* 401*   Basic Metabolic Panel: Recent Labs  Lab 12/08/20 0855 12/08/20 0857 12/09/20 0400 12/10/20 0303 12/11/20 0536  NA 133* 135 135 135 132*  K 4.2 4.2 3.9 4.2 3.9  CL 99 98 101 101 99  CO2 26  --  23 25 25   GLUCOSE 131* 129* 113* 139* 122*  BUN 11 12 9 10 14   CREATININE 0.62 0.50 0.59 0.60 0.58  CALCIUM 9.5  --  9.8 9.6 9.7   GFR: Estimated Creatinine Clearance: 64.8 mL/min (by C-G formula based on SCr of 0.58 mg/dL). Liver Function Tests: Recent Labs  Lab 12/08/20 0855  AST 28  ALT 39  ALKPHOS 84  BILITOT 0.5  PROT 8.1  ALBUMIN 2.8*   No results for input(s): LIPASE, AMYLASE in the last 168 hours. No results for input(s): AMMONIA in the last 168 hours. Coagulation Profile: Recent Labs  Lab 12/08/20 0855  INR 1.0   Cardiac Enzymes: No results for input(s): CKTOTAL, CKMB, CKMBINDEX, TROPONINI in the last 168 hours. BNP (last 3 results) No results for input(s): PROBNP in the last 8760 hours. HbA1C: No results for input(s): HGBA1C in the last 72 hours. CBG: Recent Labs  Lab 12/08/20 0852  GLUCAP 125*   Lipid Profile: No results for input(s): CHOL, HDL, LDLCALC, TRIG, CHOLHDL, LDLDIRECT in the last 72 hours. Thyroid Function Tests: Recent Labs    12/09/20 0400  TSH 0.783   Anemia Panel: No results for input(s): VITAMINB12, FOLATE, FERRITIN, TIBC, IRON, RETICCTPCT in the last 72 hours. Urine analysis:    Component Value Date/Time   COLORURINE YELLOW 11/30/2020 1218   APPEARANCEUR CLEAR 11/30/2020 1218   LABSPEC <=1.005 (A) 11/30/2020 1218   PHURINE 6.5 11/30/2020 1218   GLUCOSEU  NEGATIVE 11/30/2020 1218   HGBUR SMALL (A) 11/30/2020 1218   BILIRUBINUR NEGATIVE 11/30/2020 1218   BILIRUBINUR neg 06/15/2020 1333   KETONESUR NEGATIVE 11/30/2020 1218   PROTEINUR NEGATIVE 10/06/2020 1844   UROBILINOGEN 0.2 11/30/2020 1218   NITRITE NEGATIVE 11/30/2020 1218   LEUKOCYTESUR NEGATIVE 11/30/2020 1218   Sepsis Labs: @LABRCNTIP (procalcitonin:4,lacticidven:4)  ) Recent Results (from the past 240 hour(s))  Resp Panel by RT-PCR (Flu A&B, Covid)     Status: None   Collection Time: 12/08/20  9:17 AM  Result Value Ref Range Status   SARS Coronavirus 2 by RT PCR NEGATIVE NEGATIVE Final    Comment: (NOTE) SARS-CoV-2 target nucleic acids are NOT DETECTED.  The SARS-CoV-2 RNA is generally detectable in upper respiratory specimens during the acute phase of infection. The lowest concentration of SARS-CoV-2 viral copies this assay can detect is 138 copies/mL. A negative result does not preclude SARS-Cov-2 infection and should not be used as the sole basis for treatment or other patient management decisions. A negative result may occur with  improper specimen collection/handling, submission of specimen other than nasopharyngeal  swab, presence of viral mutation(s) within the areas targeted by this assay, and inadequate number of viral copies(<138 copies/mL). A negative result must be combined with clinical observations, patient history, and epidemiological information. The expected result is Negative.  Fact Sheet for Patients:  EntrepreneurPulse.com.au  Fact Sheet for Healthcare Providers:  IncredibleEmployment.be  This test is no t yet approved or cleared by the [place] FDA and  has been authorized for detection and/or diagnosis of SARS-CoV-2 by FDA under an Emergency Use Authorization (EUA). This EUA will remain  in effect (meaning this test can be used) for the duration of the COVID-19 declaration under Section 564(b)(1) of the  Act, 21 U.S.C.section 360bbb-3(b)(1), unless the authorization is terminated  or revoked sooner.       Influenza A by PCR NEGATIVE NEGATIVE Final   Influenza B by PCR NEGATIVE NEGATIVE Final    Comment: (NOTE) The Xpert Xpress SARS-CoV-2/FLU/RSV plus assay is intended as an aid in the diagnosis of influenza from Nasopharyngeal swab specimens and should not be used as a sole basis for treatment. Nasal washings and aspirates are unacceptable for Xpert Xpress SARS-CoV-2/FLU/RSV testing.  Fact Sheet for Patients: EntrepreneurPulse.com.au  Fact Sheet for Healthcare Providers: IncredibleEmployment.be  This test is not yet approved or cleared by the [place] FDA and has been authorized for detection and/or diagnosis of SARS-CoV-2 by FDA under an Emergency Use Authorization (EUA). This EUA will remain in effect (meaning this test can be used) for the duration of the COVID-19 declaration under Section 564(b)(1) of the Act, 21 U.S.C. section 360bbb-3(b)(1), unless the authorization is terminated or revoked.  Performed at [facility], [place] [address] [place], [place] [address]          Radiology Studies: CT CHEST ABDOMEN PELVIS W CONTRAST  Result Date: 12/09/2020 CLINICAL DATA:  Melanoma of the right hip. Now with brain metastases. Staging. EXAM: CT CHEST, ABDOMEN, AND PELVIS WITH CONTRAST TECHNIQUE: Multidetector CT imaging of the chest, abdomen and pelvis was performed following the standard protocol during bolus administration of intravenous contrast. CONTRAST:  38mL OMNIPAQUE IOHEXOL 350 MG/ML SOLN COMPARISON:  None. FINDINGS: CT CHEST FINDINGS Cardiovascular: The heart size is normal. No substantial pericardial effusion. Mediastinum/Nodes: No mediastinal lymphadenopathy. There is no hilar lymphadenopathy. The esophagus has normal imaging features. There is no axillary lymphadenopathy. Lungs/Pleura: Minimal biapical pleuroparenchymal  scarring. No suspicious pulmonary nodule or mass. No focal airspace consolidation. No pleural effusion. Musculoskeletal: No worrisome lytic or sclerotic osseous abnormality. CT ABDOMEN PELVIS FINDINGS Hepatobiliary: No suspicious focal abnormality within the liver parenchyma. There is no evidence for gallstones, gallbladder wall thickening, or pericholecystic fluid. No intrahepatic or extrahepatic biliary dilation. Pancreas: No focal mass lesion. No dilatation of the main duct. No intraparenchymal cyst. No peripancreatic edema. Spleen: 8 mm hypoattenuating lesion noted posterior spleen on 21/3 with 9 mm hypoattenuating lesion anterior spleen on 22/3. Subtle tiny hypoattenuating inferior splenic lesion on 30/3. Adrenals/Urinary Tract: 17 mm right adrenal nodule evident. Kidneys unremarkable No evidence for hydroureter. The urinary bladder appears normal for the degree of distention. Stomach/Bowel: Stomach is unremarkable. No gastric wall thickening. No evidence of outlet obstruction. Duodenum is normally positioned as is the ligament of Treitz. No small bowel wall thickening. No small bowel dilatation. The terminal ileum is normal. No gross colonic mass. No colonic wall thickening. Vascular/Lymphatic: No abdominal aortic aneurysm relatively bulky lymphadenopathy is seen in the gastrohepatic ligament and celiac axis. 16 mm short axis node visible on 21/3. 16 mm lesion noted on  20/3. 12 mm short axis lymph node identified in the portal caval space on 27/3. Retroperitoneal lymphadenopathy evident with 13 mm short axis left para-aortic node on 30/3. No pelvic sidewall lymphadenopathy. Somewhat bulky Reproductive: The uterus is unremarkable.  There is no adnexal mass. Other: No intraperitoneal free fluid. Musculoskeletal: No worrisome lytic or sclerotic osseous abnormality. IMPRESSION: 1. Prominent lymphadenopathy in the gastrohepatic ligament and celiac axis with retroperitoneal lymphadenopathy. Imaging features  compatible with metastatic disease. PET-CT may prove helpful to further evaluate, as clinically warranted. 2. 17 mm right adrenal nodule, indeterminate, but metastatic disease not excluded. 3. Tiny hypoattenuating splenic lesions. Attention on follow-up recommended. Electronically Signed   By: Misty Stanley M.D.   On: 12/09/2020 21:48     Scheduled Meds:  dexamethasone (DECADRON) injection  4 mg Intravenous Q12H   erythromycin   Right Eye BID   levETIRAcetam  500 mg Oral BID   ofloxacin  1 drop Right Eye QID   prednisoLONE acetate  1 drop Right Eye QID   sodium chloride flush  3 mL Intravenous Q12H   Continuous Infusions:  sodium chloride       LOS: 3 days   Time spent: 51min  Domenic Polite, MD [facility]   12/11/2020, 1:28 PM

## 2020-12-11 NOTE — Plan of Care (Signed)

## 2020-12-11 NOTE — Progress Notes (Signed)
Patient walked around the unit this afternoon with standby assistance using the front wheel walker, pt has a steady balance and did not demonstrate issues completing four laps around the nurses station. Pt sitting up in the room chair with family.

## 2020-12-12 MED ORDER — DEXAMETHASONE 4 MG PO TABS
4.0000 mg | ORAL_TABLET | Freq: Two times a day (BID) | ORAL | Status: DC
  Administered 2020-12-12 – 2020-12-16 (×8): 4 mg via ORAL
  Filled 2020-12-12 (×8): qty 1

## 2020-12-12 MED ORDER — POLYETHYLENE GLYCOL 3350 17 G PO PACK
17.0000 g | PACK | Freq: Every day | ORAL | Status: DC
  Administered 2020-12-12 – 2020-12-16 (×5): 17 g via ORAL
  Filled 2020-12-12 (×5): qty 1

## 2020-12-12 NOTE — Progress Notes (Signed)
PROGRESS NOTE    Mekala Winger  YCX:[id number] DOB: August 21, 1955 DOA: 12/08/2020 PCP: [name], NP  Brief Narrative: 65 year old female with history of hypertension, iron deficiency anemia, TIA, history of melanoma of right hip which was resected in 8/21,, history of vitreous hemorrhage, just underwent right eye vitrectomy, subconjunctival hemorrhage was brought to the ED with acute onset confusion, speech deficits, inability to follow commands and blank stares,, symptoms started today-admission. -MRI brain noted 2 enhancing lesions in the left cerebral hemisphere larger in the posterior temporal lobe concerning for metastatic disease, edema was noted surrounding the lesions. -Seen by neurosurgery, neuro-oncology was consulted, stereotactic radiosurgery was recommended   Assessment & Plan:   Brain metastasis Suspected melanoma primary, had melanoma resected from right hip in 09/2019 -was stage I disease at the time of diagnosis, did not require additional therapy beyond excision -Neurosurgery Dr. Marcello Moores following, recommended stereotactic radiosurgery,  -radiation oncology team consulting, CT abdomen pelvis noted left retroperitoneal lymphadenopathy -Plan to discuss case at tumor board on Monday to determine further plans -Oncology Dr. Marin Olp following -CT findings of retroperitoneal lymphadenopathy discussed with interventional radiology, they felt retroperitoneal lymph node biopsy would be extremely challenging and difficult to access, recommended PET scan to be done prior to attempts at biopsy. -Also called and discussed with neurosurgery Dr. Marcello Moores 10/14, due to location of brain mets in her speech center, felt biopsy would be relatively risky, also recommended PET scan. -Await further discussion at tumor board tomorrow -Continue Decadron and low-dose Keppra -May need transfer to [facility] if XRT planned soon -Ambulate, PT OT  Aphasia, expressive and  receptive Confusion -Secondary to above,  -Remains on Decadron and Keppra  Right eye vitreous hemorrhage -Status post right vitrectomy and endolaser on 12/08/2010 by Dr. Posey Pronto -With subconjunctival hemorrhage -Continue Pred forte, ofloxacin, erythromycin ophthalmic ointment to right eye -Follow-up with Dr. Posey Pronto   DVT prophylaxis: SCDs Code Status: Full code Family Communication: Discussed with son and daughter at bedside Disposition Plan:  Status is: Inpatient  Remains inpatient appropriate because:Inpatient level of care appropriate due to severity of illness  Dispo: The patient is from: Home              Anticipated d/c is to: SNF              Patient currently is not medically stable to d/c.   Difficult to place patient No   Consultants:  Neurosurgery Neurooncology Dr. Mickeal Skinner Radiation oncology Oncology Dr. Marin Olp Interventional radiology  Procedures:   Antimicrobials:    Subjective: -Doing better today, took a shower, walked in the halls yesterday  Objective: Vitals:   12/11/20 2257 12/12/20 0043 12/12/20 0414 12/12/20 0806  BP: (!) 108/54 (!) 121/59 (!) 107/59 126/65  Pulse: 64 68 (!) 57 94  Resp: 18 13 15 17   Temp:  98.7 F (37.1 C) 97.8 F (36.6 C)   TempSrc:  Oral Oral   SpO2: 97% 96% 98%   Weight:      Height:        Intake/Output Summary (Last 24 hours) at 12/12/2020 1126 Last data filed at 12/12/2020 0415 Gross per 24 hour  Intake 0 ml  Output 500 ml  Net -500 ml   Filed Weights   12/08/20 0800 12/08/20 1018  Weight: 64.8 kg 64.8 kg    Examination:  General exam: Pleasant middle-aged female sitting up in bed, awake alert oriented to self and place, improvement in aphasia noted HEENT: Right eye with subconjunctival hemorrhage CVS: S1-S2, regular rate  rhythm Lungs: Clear bilaterally Abdomen: Soft, nontender, bowel sounds present  Extremities: No edema Neuro: Improved aphasia, strength in upper and lower extremities within normal  limits  Psychiatry: Improved insight and judgment    Data Reviewed:   CBC: Recent Labs  Lab 12/08/20 0855 12/08/20 0857 12/09/20 0400 12/10/20 0303 12/11/20 0536  WBC 10.0  --  12.2* 11.8* 10.4  NEUTROABS 7.5  --   --   --   --   HGB 10.4* 11.6* 10.8* 11.3* 10.9*  HCT 34.7* 34.0* 35.5* 36.5 36.1  MCV 78.9*  --  75.9* 75.6* 76.3*  PLT 425*  --  439* 435* 458*   Basic Metabolic Panel: Recent Labs  Lab 12/08/20 0855 12/08/20 0857 12/09/20 0400 12/10/20 0303 12/11/20 0536  NA 133* 135 135 135 132*  K 4.2 4.2 3.9 4.2 3.9  CL 99 98 101 101 99  CO2 26  --  23 25 25   GLUCOSE 131* 129* 113* 139* 122*  BUN 11 12 9 10 14   CREATININE 0.62 0.50 0.59 0.60 0.58  CALCIUM 9.5  --  9.8 9.6 9.7   GFR: Estimated Creatinine Clearance: 64.8 mL/min (by C-G formula based on SCr of 0.58 mg/dL). Liver Function Tests: Recent Labs  Lab 12/08/20 0855  AST 28  ALT 39  ALKPHOS 84  BILITOT 0.5  PROT 8.1  ALBUMIN 2.8*   No results for input(s): LIPASE, AMYLASE in the last 168 hours. No results for input(s): AMMONIA in the last 168 hours. Coagulation Profile: Recent Labs  Lab 12/08/20 0855  INR 1.0   Cardiac Enzymes: No results for input(s): CKTOTAL, CKMB, CKMBINDEX, TROPONINI in the last 168 hours. BNP (last 3 results) No results for input(s): PROBNP in the last 8760 hours. HbA1C: No results for input(s): HGBA1C in the last 72 hours. CBG: Recent Labs  Lab 12/08/20 0852  GLUCAP 125*   Lipid Profile: No results for input(s): CHOL, HDL, LDLCALC, TRIG, CHOLHDL, LDLDIRECT in the last 72 hours. Thyroid Function Tests: No results for input(s): TSH, T4TOTAL, FREET4, T3FREE, THYROIDAB in the last 72 hours.  Anemia Panel: No results for input(s): VITAMINB12, FOLATE, FERRITIN, TIBC, IRON, RETICCTPCT in the last 72 hours. Urine analysis:    Component Value Date/Time   COLORURINE YELLOW 11/30/2020 1218   APPEARANCEUR CLEAR 11/30/2020 1218   LABSPEC <=1.005 (A) 11/30/2020 1218    PHURINE 6.5 11/30/2020 1218   GLUCOSEU NEGATIVE 11/30/2020 1218   HGBUR SMALL (A) 11/30/2020 1218   BILIRUBINUR NEGATIVE 11/30/2020 1218   BILIRUBINUR neg 06/15/2020 1333   KETONESUR NEGATIVE 11/30/2020 1218   PROTEINUR NEGATIVE 10/06/2020 1844   UROBILINOGEN 0.2 11/30/2020 1218   NITRITE NEGATIVE 11/30/2020 1218   LEUKOCYTESUR NEGATIVE 11/30/2020 1218   Sepsis Labs: @LABRCNTIP (procalcitonin:4,lacticidven:4)  ) Recent Results (from the past 240 hour(s))  Resp Panel by RT-PCR (Flu A&B, Covid)     Status: None   Collection Time: 12/08/20  9:17 AM  Result Value Ref Range Status   SARS Coronavirus 2 by RT PCR NEGATIVE NEGATIVE Final    Comment: (NOTE) SARS-CoV-2 target nucleic acids are NOT DETECTED.  The SARS-CoV-2 RNA is generally detectable in upper respiratory specimens during the acute phase of infection. The lowest concentration of SARS-CoV-2 viral copies this assay can detect is 138 copies/mL. A negative result does not preclude SARS-Cov-2 infection and should not be used as the sole basis for treatment or other patient management decisions. A negative result may occur with  improper specimen collection/handling, submission of specimen other than nasopharyngeal swab,  presence of viral mutation(s) within the areas targeted by this assay, and inadequate number of viral copies(<138 copies/mL). A negative result must be combined with clinical observations, patient history, and epidemiological information. The expected result is Negative.  Fact Sheet for Patients:  EntrepreneurPulse.com.au  Fact Sheet for Healthcare Providers:  IncredibleEmployment.be  This test is no t yet approved or cleared by the [place] FDA and  has been authorized for detection and/or diagnosis of SARS-CoV-2 by FDA under an Emergency Use Authorization (EUA). This EUA will remain  in effect (meaning this test can be used) for the duration of the COVID-19  declaration under Section 564(b)(1) of the Act, 21 U.S.C.section 360bbb-3(b)(1), unless the authorization is terminated  or revoked sooner.       Influenza A by PCR NEGATIVE NEGATIVE Final   Influenza B by PCR NEGATIVE NEGATIVE Final    Comment: (NOTE) The Xpert Xpress SARS-CoV-2/FLU/RSV plus assay is intended as an aid in the diagnosis of influenza from Nasopharyngeal swab specimens and should not be used as a sole basis for treatment. Nasal washings and aspirates are unacceptable for Xpert Xpress SARS-CoV-2/FLU/RSV testing.  Fact Sheet for Patients: EntrepreneurPulse.com.au  Fact Sheet for Healthcare Providers: IncredibleEmployment.be  This test is not yet approved or cleared by the [place] FDA and has been authorized for detection and/or diagnosis of SARS-CoV-2 by FDA under an Emergency Use Authorization (EUA). This EUA will remain in effect (meaning this test can be used) for the duration of the COVID-19 declaration under Section 564(b)(1) of the Act, 21 U.S.C. section 360bbb-3(b)(1), unless the authorization is terminated or revoked.  Performed at [facility], [place] [address], [place], [place] [address]     Radiology Studies: No results found.   Scheduled Meds:  dexamethasone (DECADRON) injection  4 mg Intravenous Q12H   erythromycin   Right Eye BID   levETIRAcetam  500 mg Oral BID   ofloxacin  1 drop Right Eye QID   prednisoLONE acetate  1 drop Right Eye QID   senna-docusate  1 tablet Oral BID   sodium chloride flush  3 mL Intravenous Q12H   Continuous Infusions:  sodium chloride       LOS: 4 days   Time spent: 21min  Domenic Polite, MD [facility]   12/12/2020, 11:26 AM

## 2020-12-12 NOTE — Progress Notes (Signed)
Pt hasn't had a BM since 12/06/2020. States she has problems with constipation at home and takes something. On Senna BID here and it hasn't been effective. Wants something else

## 2020-12-13 ENCOUNTER — Inpatient Hospital Stay: Payer: Medicare Other

## 2020-12-13 ENCOUNTER — Ambulatory Visit: Payer: Medicare Other

## 2020-12-13 DIAGNOSIS — Z85820 Personal history of malignant melanoma of skin: Secondary | ICD-10-CM | POA: Diagnosis not present

## 2020-12-13 DIAGNOSIS — D496 Neoplasm of unspecified behavior of brain: Secondary | ICD-10-CM | POA: Diagnosis not present

## 2020-12-13 LAB — CBC
HCT: 35.1 % — ABNORMAL LOW (ref 36.0–46.0)
Hemoglobin: 10.6 g/dL — ABNORMAL LOW (ref 12.0–15.0)
MCH: 23.2 pg — ABNORMAL LOW (ref 26.0–34.0)
MCHC: 30.2 g/dL (ref 30.0–36.0)
MCV: 76.8 fL — ABNORMAL LOW (ref 80.0–100.0)
Platelets: 400 10*3/uL (ref 150–400)
RBC: 4.57 MIL/uL (ref 3.87–5.11)
RDW: 18.1 % — ABNORMAL HIGH (ref 11.5–15.5)
WBC: 11.4 10*3/uL — ABNORMAL HIGH (ref 4.0–10.5)
nRBC: 0 % (ref 0.0–0.2)

## 2020-12-13 LAB — BASIC METABOLIC PANEL
Anion gap: 8 (ref 5–15)
BUN: 14 mg/dL (ref 8–23)
CO2: 25 mmol/L (ref 22–32)
Calcium: 9.5 mg/dL (ref 8.9–10.3)
Chloride: 99 mmol/L (ref 98–111)
Creatinine, Ser: 0.66 mg/dL (ref 0.44–1.00)
GFR, Estimated: >60 mL/min (ref >60)
Glucose, Bld: 147 mg/dL — ABNORMAL HIGH (ref 70–99)
Potassium: 4.5 mmol/L (ref 3.5–5.1)
Sodium: 132 mmol/L — ABNORMAL LOW (ref 135–145)

## 2020-12-13 NOTE — Progress Notes (Signed)
Ariel [name] is doing a lot better mentally.  She is certainly much more alert.  I think the problem that began to have is that we really need to get tissue so we can see if this is truly melanoma recurrence.  She had a stage I melanoma which I would think would be a little unusual to come back so quickly.  If this is melanoma, then we can see if this has a BRAF mutation.  She really does not have large lymph nodes in the abdomen or retroperitoneum that would make it easier to get a biopsy.  The largest tumors are in the brain but neurosurgery thinks this might be a little too risky.  I think another option would be, and this would be incredibly invasive, would be some type of laparoscopic biopsy.  I do not know if this would be reasonable.  Again, if were going to treat her appropriately, we really have to get tissue and molecular studies.  Her labs look okay.  She did get some iron in the hospital.  We will see about given her another dose of IV iron.  Her hemoglobin is 10.6.  Her platelet count is 400,000.  Her sodium is 132.  Potassium 4.5.  BUN 14 creatinine 0.66.  I suspect that the Decadron that she is on is helping quite a bit.  I am just glad that her mental status is improving.  She is certainly quite chatty this morning.  It was nice talking with her.  [name], MD  [facility] 6:2

## 2020-12-13 NOTE — Evaluation (Signed)
Physical Therapy Evaluation Patient Details Name: Ariel [name] MRN: [id number] DOB: April 24, 1955 Today's Date: 12/13/2020  History of Present Illness  Ariel [name] is a 65 y.o. female with medical history significant of HLD, IDA, TIA in 09/2020, melanoma of the right hip 2021 stage IB and vitreous hemorrhage of the right eye s/p vitrectomy 12/07/20 who presented to ED for sudden onset nonverbal state and inability to follow commands. Pt found to have metastatic brain disease to L temporal and parietal lobes.   Clinical Impression  Pt doing exceptionally well. Pt is functioning from a mobility standpoint near baseline. Pt demonstrates some higher level, multi-step processing difficulties, and short term memory deficits but is very in tune to deficits and is aware she has some word finding difficulties. Pt in great spirits and appreciative of services with very supportive family. Recommend outpt PT for higher level balance deficits to progress towards independence. Acute PT to follow.     Recommendations for follow up therapy are one component of a multi-disciplinary discharge planning process, led by the attending physician.  Recommendations may be updated based on patient status, additional functional criteria and insurance authorization.  Follow Up Recommendations Outpatient PT;Supervision/Assistance - 24 hour (for higher level balance)    Equipment Recommendations  Rolling walker with 5" wheels    Recommendations for Other Services       Precautions / Restrictions Precautions Precautions: Fall Restrictions Weight Bearing Restrictions: No      Mobility  Bed Mobility               General bed mobility comments: pt sitting up in chair    Transfers Overall transfer level: Needs assistance Equipment used: None Transfers: Sit to/from Stand Sit to Stand: Min guard         General transfer comment: min guard for safety but no physical assist needed, verbal cues for hand  placement on arm rests to stand up  Ambulation/Gait Ambulation/Gait assistance: Min guard Gait Distance (Feet): 200 Feet Assistive device: None Gait Pattern/deviations: Step-through pattern;Decreased stride length Gait velocity: dec Gait velocity interpretation: 1.31 - 2.62 ft/sec, indicative of limited community ambulator General Gait Details: pt slow and guarded due to first time amb without RW however steady, no episodes of LOB, pt with shorter step length initially then increased throughout ambulation  Stairs            Wheelchair Mobility    Modified Rankin (Stroke Patients Only)       Balance Overall balance assessment: Needs assistance Sitting-balance support: Feet supported;No upper extremity supported Sitting balance-Leahy Scale: Good     Standing balance support: No upper extremity supported;During functional activity Standing balance-Leahy Scale: Fair                               Pertinent Vitals/Pain Pain Assessment: No/denies pain    Home Living Family/patient expects to be discharged to:: Private residence Living Arrangements: Alone Available Help at Discharge: Family;Available PRN/intermittently Type of Home: House Home Access: Stairs to enter (1 little step)   Entrance Stairs-Number of Steps: 1 Home Layout: Two level;Able to live on main level with bedroom/bathroom Home Equipment: Shower seat;Grab bars - toilet;Grab bars - tub/shower      Prior Function Level of Independence: Independent               Hand Dominance   Dominant Hand: Right    Extremity/Trunk Assessment   Upper Extremity Assessment Upper Extremity Assessment:  Overall [facility] for tasks assessed    Lower Extremity Assessment Lower Extremity Assessment: Overall WFL for tasks assessed    Cervical / Trunk Assessment Cervical / Trunk Assessment: Normal  Communication   Communication: No difficulties  Cognition Arousal/Alertness: Awake/alert Behavior  During Therapy: WFL for tasks assessed/performed Overall Cognitive Status: Within Functional Limits for tasks assessed                                 General Comments: pt with some higher level delayed processing in multistep cues but otherwise was intact per family      General Comments General comments (skin integrity, edema, etc.): VSS    Exercises     Assessment/Plan    PT Assessment Patient needs continued PT services  PT Problem List Decreased strength;Decreased range of motion;Decreased activity tolerance;Decreased balance;Decreased mobility;Decreased coordination;Decreased cognition       PT Treatment Interventions DME instruction;Gait training;Stair training;Functional mobility training;Therapeutic activities;Therapeutic exercise;Balance training;Neuromuscular re-education    PT Goals (Current goals can be found in the Care Plan section)  Acute Rehab PT Goals Patient Stated Goal: get better PT Goal Formulation: With patient/family Time For Goal Achievement: 12/27/20 Potential to Achieve Goals: Good Additional Goals Additional Goal #1: Pt to score >19 on DGI to indicate minimal falls risk.    Frequency Min 3X/week   Barriers to discharge        Co-evaluation               AM-PAC PT "6 Clicks" Mobility  Outcome Measure Help needed turning from your back to your side while in a flat bed without using bedrails?: None Help needed moving from lying on your back to sitting on the side of a flat bed without using bedrails?: None Help needed moving to and from a bed to a chair (including a wheelchair)?: None Help needed standing up from a chair using your arms (e.g., wheelchair or bedside chair)?: None Help needed to walk in hospital room?: A Little Help needed climbing 3-5 steps with a railing? : A Little 6 Click Score: 22    End of Session Equipment Utilized During Treatment: Gait belt Activity Tolerance: Patient tolerated treatment  well Patient left: in chair;with call bell/phone within reach;with family/visitor present Nurse Communication: Mobility status PT Visit Diagnosis: Unsteadiness on feet (R26.81);Difficulty in walking, not elsewhere classified (R26.2)    Time: 1320-1340 PT Time Calculation (min) (ACUTE ONLY): 20 min   Charges:   PT Evaluation $PT Eval Low Complexity: 1 Low          Kittie Plater, PT, DPT Acute Rehabilitation Services Pager #: [phone] Office #: [phone]   Berline Lopes 12/13/2020, 2:34 PM

## 2020-12-13 NOTE — Progress Notes (Addendum)
PROGRESS NOTE    Ariel [name]  UUE:[id number] DOB: 09-26-1955 DOA: 12/08/2020 PCP: [name], NP  Brief Narrative: 65 year old female with history of hypertension, iron deficiency anemia, TIA, history of melanoma of right hip which was resected in 8/21,, history of vitreous hemorrhage, just underwent right eye vitrectomy, subconjunctival hemorrhage was brought to the ED with acute onset confusion, speech deficits, inability to follow commands and blank stares,, symptoms started today-admission. -MRI brain noted 2 enhancing lesions in the left cerebral hemisphere larger in the posterior temporal lobe concerning for metastatic disease, edema was noted surrounding the lesions. -Seen by neurosurgery, neuro-oncology was consulted, stereotactic radiosurgery was recommended   Assessment & Plan:   Brain metastasis Suspected melanoma primary, had melanoma resected from right hip in 09/2019 -was stage I disease at the time of diagnosis, did not require additional therapy beyond excision -Neurosurgery Dr. Marcello Moores consulted, recommended stereotactic radiosurgery, felt brain biopsy would be high risk given location -radiation oncology team consulting -Case was discussed at tumor board review at [facility] long today, consensus was need for tissue biopsy to determine further treatment plans including XRT -CT abdomen pelvis noted retroperitoneal lymphadenopathy and some gastro-hepatic lymph nodes, IR felt retroperitoneal and gastro-hepatic lymph nodes would be difficult to access, recommended GI eval  -Will discuss with gastroenterology today if EUS, lymph node biopsies may be possible -If not may need ex lap for biopsy -Continue Decadron and low-dose Keppra -PT OT eval -Confusion has improved considerably  Aphasia, expressive and receptive Confusion -Secondary to above, improved -Remains on Decadron and Keppra  Right eye vitreous hemorrhage -Status post right vitrectomy and endolaser on  12/08/2010 by Dr. Posey Pronto -With subconjunctival hemorrhage -Continue Pred forte, ofloxacin, erythromycin ophthalmic ointment to right eye -Follow-up with Dr. Posey Pronto  DVT prophylaxis: SCDs Code Status: Full code Family Communication: Discussed with son and daughter at bedside Disposition Plan:  Status is: Inpatient  Remains inpatient appropriate because:Inpatient level of care appropriate due to severity of illness  Dispo: The patient is from: Home              Anticipated d/c is to: SNF              Patient currently is not medically stable to d/c.   Difficult to place patient No   Consultants:  Neurosurgery Neurooncology Dr. Mickeal Skinner Radiation oncology Oncology Dr. Marin Olp Interventional radiology  Procedures:   Antimicrobials:    Subjective: -Feels okay, denies any complaints  Objective: Vitals:   12/12/20 2004 12/12/20 2357 12/13/20 0322 12/13/20 1153  BP: 116/73 121/77 (!) 110/58 (!) 111/99  Pulse: 68 76 66 74  Resp: 15 15 17 18   Temp: 97.9 F (36.6 C) 98.4 F (36.9 C) 97.9 F (36.6 C)   TempSrc: Oral Oral Oral Oral  SpO2: 96% 98% 98%   Weight:      Height:        Intake/Output Summary (Last 24 hours) at 12/13/2020 1508 Last data filed at 12/12/2020 2153 Gross per 24 hour  Intake 100 ml  Output --  Net 100 ml   Filed Weights   12/08/20 0800 12/08/20 1018  Weight: 64.8 kg 64.8 kg    Examination:  General exam: Pleasant middle-aged female sitting up in bed, awake alert oriented to self and place, improvement in aphasia noted HEENT: Right eye with subconjunctival hemorrhage CVS: S1-S2, regular rate rhythm Lungs: Clear bilaterally Abdomen: Soft, nontender, bowel sounds present Extremities: No edema Neuro: Improved aphasia, strength in upper and lower extremities within normal limits  Psychiatry:  Improved insight and judgment    Data Reviewed:   CBC: Recent Labs  Lab 12/08/20 0855 12/08/20 0857 12/09/20 0400 12/10/20 0303 12/11/20 0536  12/13/20 0312  WBC 10.0  --  12.2* 11.8* 10.4 11.4*  NEUTROABS 7.5  --   --   --   --   --   HGB 10.4* 11.6* 10.8* 11.3* 10.9* 10.6*  HCT 34.7* 34.0* 35.5* 36.5 36.1 35.1*  MCV 78.9*  --  75.9* 75.6* 76.3* 76.8*  PLT 425*  --  439* 435* 460* 269   Basic Metabolic Panel: Recent Labs  Lab 12/08/20 0855 12/08/20 0857 12/09/20 0400 12/10/20 0303 12/11/20 0536 12/13/20 0312  NA 133* 135 135 135 132* 132*  K 4.2 4.2 3.9 4.2 3.9 4.5  CL 99 98 101 101 99 99  CO2 26  --  23 25 25 25   GLUCOSE 131* 129* 113* 139* 122* 147*  BUN 11 12 9 10 14 14   CREATININE 0.62 0.50 0.59 0.60 0.58 0.66  CALCIUM 9.5  --  9.8 9.6 9.7 9.5   GFR: Estimated Creatinine Clearance: 64.8 mL/min (by C-G formula based on SCr of 0.66 mg/dL). Liver Function Tests: Recent Labs  Lab 12/08/20 0855  AST 28  ALT 39  ALKPHOS 84  BILITOT 0.5  PROT 8.1  ALBUMIN 2.8*   No results for input(s): LIPASE, AMYLASE in the last 168 hours. No results for input(s): AMMONIA in the last 168 hours. Coagulation Profile: Recent Labs  Lab 12/08/20 0855  INR 1.0   Cardiac Enzymes: No results for input(s): CKTOTAL, CKMB, CKMBINDEX, TROPONINI in the last 168 hours. BNP (last 3 results) No results for input(s): PROBNP in the last 8760 hours. HbA1C: No results for input(s): HGBA1C in the last 72 hours. CBG: Recent Labs  Lab 12/08/20 0852  GLUCAP 125*   Lipid Profile: No results for input(s): CHOL, HDL, LDLCALC, TRIG, CHOLHDL, LDLDIRECT in the last 72 hours. Thyroid Function Tests: No results for input(s): TSH, T4TOTAL, FREET4, T3FREE, THYROIDAB in the last 72 hours.  Anemia Panel: No results for input(s): VITAMINB12, FOLATE, FERRITIN, TIBC, IRON, RETICCTPCT in the last 72 hours. Urine analysis:    Component Value Date/Time   COLORURINE YELLOW 11/30/2020 1218   APPEARANCEUR CLEAR 11/30/2020 1218   LABSPEC <=1.005 (A) 11/30/2020 1218   PHURINE 6.5 11/30/2020 1218   GLUCOSEU NEGATIVE 11/30/2020 1218   HGBUR SMALL  (A) 11/30/2020 1218   BILIRUBINUR NEGATIVE 11/30/2020 1218   BILIRUBINUR neg 06/15/2020 1333   KETONESUR NEGATIVE 11/30/2020 1218   PROTEINUR NEGATIVE 10/06/2020 1844   UROBILINOGEN 0.2 11/30/2020 1218   NITRITE NEGATIVE 11/30/2020 1218   LEUKOCYTESUR NEGATIVE 11/30/2020 1218   Sepsis Labs: @LABRCNTIP (procalcitonin:4,lacticidven:4)  ) Recent Results (from the past 240 hour(s))  Resp Panel by RT-PCR (Flu A&B, Covid)     Status: None   Collection Time: 12/08/20  9:17 AM  Result Value Ref Range Status   SARS Coronavirus 2 by RT PCR NEGATIVE NEGATIVE Final    Comment: (NOTE) SARS-CoV-2 target nucleic acids are NOT DETECTED.  The SARS-CoV-2 RNA is generally detectable in upper respiratory specimens during the acute phase of infection. The lowest concentration of SARS-CoV-2 viral copies this assay can detect is 138 copies/mL. A negative result does not preclude SARS-Cov-2 infection and should not be used as the sole basis for treatment or other patient management decisions. A negative result may occur with  improper specimen collection/handling, submission of specimen other than nasopharyngeal swab, presence of viral mutation(s) within the areas targeted  by this assay, and inadequate number of viral copies(<138 copies/mL). A negative result must be combined with clinical observations, patient history, and epidemiological information. The expected result is Negative.  Fact Sheet for Patients:  EntrepreneurPulse.com.au  Fact Sheet for Healthcare Providers:  IncredibleEmployment.be  This test is no t yet approved or cleared by the [place] FDA and  has been authorized for detection and/or diagnosis of SARS-CoV-2 by FDA under an Emergency Use Authorization (EUA). This EUA will remain  in effect (meaning this test can be used) for the duration of the COVID-19 declaration under Section 564(b)(1) of the Act, 21 U.S.C.section 360bbb-3(b)(1),  unless the authorization is terminated  or revoked sooner.       Influenza A by PCR NEGATIVE NEGATIVE Final   Influenza B by PCR NEGATIVE NEGATIVE Final    Comment: (NOTE) The Xpert Xpress SARS-CoV-2/FLU/RSV plus assay is intended as an aid in the diagnosis of influenza from Nasopharyngeal swab specimens and should not be used as a sole basis for treatment. Nasal washings and aspirates are unacceptable for Xpert Xpress SARS-CoV-2/FLU/RSV testing.  Fact Sheet for Patients: EntrepreneurPulse.com.au  Fact Sheet for Healthcare Providers: IncredibleEmployment.be  This test is not yet approved or cleared by the [place] FDA and has been authorized for detection and/or diagnosis of SARS-CoV-2 by FDA under an Emergency Use Authorization (EUA). This EUA will remain in effect (meaning this test can be used) for the duration of the COVID-19 declaration under Section 564(b)(1) of the Act, 21 U.S.C. section 360bbb-3(b)(1), unless the authorization is terminated or revoked.  Performed at [facility], [place] [address] [place], [place] [address]     Radiology Studies: No results found.   Scheduled Meds:  dexamethasone  4 mg Oral BID   erythromycin   Right Eye BID   levETIRAcetam  500 mg Oral BID   ofloxacin  1 drop Right Eye QID   polyethylene glycol  17 g Oral Daily   prednisoLONE acetate  1 drop Right Eye QID   senna-docusate  1 tablet Oral BID   sodium chloride flush  3 mL Intravenous Q12H   Continuous Infusions:  sodium chloride       LOS: 5 days   Time spent: 77min  Domenic Polite, MD [facility]   12/13/2020, 3:08 PM

## 2020-12-14 ENCOUNTER — Inpatient Hospital Stay (HOSPITAL_COMMUNITY): Payer: Medicare Other | Admitting: Certified Registered"

## 2020-12-14 ENCOUNTER — Encounter (HOSPITAL_COMMUNITY)
Admission: EM | Disposition: A | Discharge status: Home / Self Care | Payer: Self-pay | Source: Home / Self Care | Attending: Internal Medicine

## 2020-12-14 ENCOUNTER — Other Ambulatory Visit: Payer: Medicare Other

## 2020-12-14 ENCOUNTER — Encounter (HOSPITAL_COMMUNITY): Payer: Self-pay | Admitting: Family Medicine

## 2020-12-14 ENCOUNTER — Ambulatory Visit: Payer: Medicare Other | Admitting: Family

## 2020-12-14 DIAGNOSIS — K3189 Other diseases of stomach and duodenum: Secondary | ICD-10-CM

## 2020-12-14 DIAGNOSIS — D496 Neoplasm of unspecified behavior of brain: Secondary | ICD-10-CM | POA: Diagnosis not present

## 2020-12-14 HISTORY — PX: UPPER ESOPHAGEAL ENDOSCOPIC ULTRASOUND (EUS): SHX6562

## 2020-12-14 HISTORY — PX: FINE NEEDLE ASPIRATION: SHX5430

## 2020-12-14 HISTORY — PX: ESOPHAGOGASTRODUODENOSCOPY (EGD) WITH PROPOFOL: SHX5813

## 2020-12-14 HISTORY — PX: BIOPSY: SHX5522

## 2020-12-14 LAB — URINALYSIS, ROUTINE W REFLEX MICROSCOPIC
Bilirubin Urine: NEGATIVE
Glucose, UA: NEGATIVE mg/dL
Ketones, ur: NEGATIVE mg/dL
Nitrite: NEGATIVE
Protein, ur: 30 mg/dL — AB
Specific Gravity, Urine: 1.019 (ref 1.005–1.030)
WBC, UA: >50 WBC/hpf — ABNORMAL HIGH (ref 0–5)
pH: 6 (ref 5.0–8.0)

## 2020-12-14 SURGERY — ESOPHAGOGASTRODUODENOSCOPY (EGD) WITH PROPOFOL
Anesthesia: Monitor Anesthesia Care

## 2020-12-14 MED ORDER — SODIUM CHLORIDE 0.9 % IV SOLN: INTRAVENOUS | Status: DC

## 2020-12-14 MED ORDER — PROPOFOL 10 MG/ML IV BOLUS
INTRAVENOUS | Status: DC | PRN
  Administered 2020-12-14: 20 mg via INTRAVENOUS
  Administered 2020-12-14: 10 mg via INTRAVENOUS

## 2020-12-14 MED ORDER — LACTATED RINGERS IV SOLN: INTRAVENOUS | Status: DC | PRN

## 2020-12-14 MED ORDER — PROPOFOL 500 MG/50ML IV EMUL
INTRAVENOUS | Status: DC | PRN
  Administered 2020-12-14: 150 ug/kg/min via INTRAVENOUS

## 2020-12-14 MED ORDER — EPHEDRINE SULFATE 50 MG/ML IJ SOLN
INTRAMUSCULAR | Status: DC | PRN
  Administered 2020-12-14: 5 mg via INTRAVENOUS

## 2020-12-14 MED ORDER — PANTOPRAZOLE SODIUM 40 MG PO TBEC
40.0000 mg | DELAYED_RELEASE_TABLET | Freq: Every day | ORAL | Status: DC
  Administered 2020-12-14 – 2020-12-16 (×3): 40 mg via ORAL
  Filled 2020-12-14 (×3): qty 1

## 2020-12-14 MED ORDER — PHENYLEPHRINE HCL (PRESSORS) 10 MG/ML IV SOLN
INTRAVENOUS | Status: DC | PRN
  Administered 2020-12-14 (×2): 80 ug via INTRAVENOUS

## 2020-12-14 SURGICAL SUPPLY — 15 items

## 2020-12-14 NOTE — H&P (View-Only) (Signed)
[facility] Consult: 8:19 AM 12/14/2020  LOS: 6 days    Referring Provider: Dr Fanny Bien  Primary Care Physician:  [name], NP Primary Gastroenterologist:  None    Reason for Consultation:  Request for EUS biopsy of lymph nodes.     HPI: Ariel [name] is a 65 y.o. female.  PMH hypertension.  Hyperlipidemia.  8/22 TIA.  Stage Ib right hip melanoma resected with negative lymph nodes 2021.  IDA diagnosed within last few weeks, eval w Dr Ammie Dalton 2 weeks ago, stool testing at the office and home was negative for blood.  Remote colonoscopy in [place] but patient does not recall any details or who did this.    12/07/2020 underwent evacuation of R ocular vitreous hemorrhage. The following day presented to ED with acute onset nonverbal state, inability to follow commands.  Seizure was suspected.  MRI confirming brain lesions.  CTAP/chest w adenopathy at the gastrohepatic ligament and celiac axis.  Retroperitoneal adenopathy.  Mets suspected.  Indeterminant R adrenal nodule.  Tiny splenic lesions. LFTs are not elevated.  Low albumin 2.8. Anemic with Hgb 8.6, 10.5 in August, 11.1 mid September.  MCV 74.  Low iron at 15.  Low TIBC, low iron sats.  Ferritin 309.  FOBT negative.   Keppra, Decadron initiated.  GI consulted for attempt to biopsy the lymph nodes.    Patient does not smoke or consume alcoholic beverages.  She is a widow.  Lives near [place].  Her son is very attentive. Family history of melanoma in her sister, she is living 10 years postdiagnosis/excision.  No family history of GI cancers or disorders or blood related disorders.    Past Medical History:  Diagnosis Date   Chicken pox    Complication of anesthesia    Per patient, very slow to wake up after anesthesia   High cholesterol     Hyperglycemia 11/10/2020   Melanoma ([place]) 2021   right hip   PONV (postoperative nausea and vomiting)    Urinary incontinence     Past Surgical History:  Procedure Laterality Date   AIR/FLUID EXCHANGE Right 12/07/2020   Procedure: AIR/FLUID EXCHANGE;  Surgeon: Jalene Mullet, MD;  Location: [facility];  Service: Ophthalmology;  Laterality: Right;   APPENDECTOMY  1962   EXCISION MELANOMA WITH SENTINEL LYMPH NODE BIOPSY Right 10/09/2019   Procedure: WIDE LOCAL EXCISION WITH ADVANCEMENT FLAP CLOSURE RIGHT HIP MELANOMA WITH SENTINEL LYMPH NODE BIOPSY;  Surgeon: Stark Klein, MD;  Location: [facility];  Service: General;  Laterality: Right;   HERNIA REPAIR  2009   MOUTH SURGERY  11/2015   gum surgery and bone implant   PARS PLANA VITRECTOMY Right 12/07/2020   Procedure: PARS PLANA VITRECTOMY WITH 25 GAUGE;  Surgeon: Jalene Mullet, MD;  Location: [facility];  Service: Ophthalmology;  Laterality: Right;   PHOTOCOAGULATION WITH LASER Right 12/07/2020   Procedure: PHOTOCOAGULATION WITH ENDOLASER PANRITINAL COAGULATION;  Surgeon: Jalene Mullet, MD;  Location: [facility];  Service: Ophthalmology;  Laterality: Right;   WISDOM TOOTH EXTRACTION      Prior to Admission medications  Medication Sig Start Date End Date Taking? Authorizing Provider  aspirin EC 81 MG tablet Take 81 mg by mouth daily. Swallow whole.   Yes [provider]  betamethasone dipropionate 0.05 % cream Apply topically 2 (two) times daily. Patient not taking: No sig reported 11/10/20   [name], NP    Scheduled Meds:  dexamethasone  4 mg Oral BID   erythromycin   Right Eye BID   levETIRAcetam  500 mg Oral BID   ofloxacin  1 drop Right Eye QID   polyethylene glycol  17 g Oral Daily   prednisoLONE acetate  1 drop Right Eye QID   senna-docusate  1 tablet Oral BID   sodium chloride flush  3 mL Intravenous Q12H   Infusions:  sodium chloride     PRN Meds: sodium chloride, acetaminophen **OR** acetaminophen, sodium  chloride flush   Allergies as of 12/08/2020 - Review Complete 12/08/2020  Allergen Reaction Noted   Codeine Nausea Only 11/19/2013   Plavix [clopidogrel]  11/30/2020    Family History  Problem Relation Age of Onset   Coronary artery disease Father        died at 67   COPD Father    Alcohol abuse Father    Diabetes Mellitus II Father    Coronary artery disease Sister    Cancer Sister        breast   Heart attack Brother    Hyperlipidemia Other        died 12- "natural causes"   Hyperlipidemia Other        4 siblings    Hypertension Other        3 siblings    Social History   Socioeconomic History   Marital status: Widowed    Spouse name: Not on file   Number of children: Not on file   Years of education: Not on file   Highest education level: Not on file  Occupational History   Occupation: retired  Tobacco Use   Smoking status: Never   Smokeless tobacco: Never  Vaping Use   Vaping Use: Never used  Substance and Sexual Activity   Alcohol use: Never   Drug use: Never   Sexual activity: Not Currently  Other Topics Concern   Not on file  Social History Narrative   Lives with husband   One Son- lives locally, 1 grand-daughter   Daughter- Sport and exercise psychologist. [place]   Completed Hotel manager school   Works as a Probation officer.         Social Determinants of Radio broadcast assistant Strain: Low Risk    Difficulty of Paying Living Expenses: Not hard at all  Food Insecurity: No Food Insecurity   Worried About Charity fundraiser in the Last Year: Never true   Arboriculturist in the Last Year: Never true  Transportation Needs: No Transportation Needs   Lack of Transportation (Medical): No   Lack of Transportation (Non-Medical): No  Physical Activity: Sufficiently Active   Days of Exercise per Week: 4 days   Minutes of Exercise per Session: 40 min  Stress: No Stress Concern Present   Feeling of Stress : Not at all  Social Connections: Moderately Isolated   Frequency  of Communication with Friends and Family: More than three times a week   Frequency of Social Gatherings with Friends and Family: More than three times a week   Attends Religious Services: More than 4 times per year   Active Member of  Clubs or Organizations: No   Attends Archivist Meetings: Never   Marital Status: Widowed  Human resources officer Violence: Not At Risk   Fear of Current or Ex-Partner: No   Emotionally Abused: No   Physically Abused: No   Sexually Abused: No    REVIEW OF SYSTEMS: Constitutional: Patient's strength is returning.  Generally she does not suffer from significant fatigue or weakness ENT:  No nose bleeds Pulm: No dyspnea.  No cough. CV:  No palpitations, no LE edema.  No angina. GU:  No hematuria, no frequency GI: See HPI.  No dysphagia.  No altered bowel habits.  No bleeding per rectum. Heme: No unusual or excessive bleeding or bruising but did have the recent ocular hemorrhage. Transfusions: None. Neuro:  No headaches, no peripheral tingling or numbness.  No syncope, no seizures. Derm:  No itching, no rash or sores.  Endocrine:  No sweats or chills.  No polyuria or dysuria Immunization: Reviewed. Travel:  None beyond local counties in last few months.    PHYSICAL EXAM: Vital signs in last 24 hours: Vitals:   12/14/20 0538 12/14/20 0803  BP: 112/63 108/65  Pulse: 64 69  Resp: 17 15  Temp: 97.9 F (36.6 C) (!) 97.5 F (36.4 C)  SpO2: 98% 98%   Wt Readings from Last 3 Encounters:  12/08/20 64.8 kg  12/07/20 64.4 kg  12/01/20 64.9 kg    General: Patient is comfortable, alert, does not look ill. Head: No facial asymmetry or swelling.  No signs of head trauma. Eyes: Remnants of hemorrhage in right eye. Ears: Not hard of hearing Nose: No congestion or discharge Mouth: Good dentition, good dental repair.  Tongue midline.  Mucosa moist, pink, clear. Neck: No JVD, no masses, no thyromegaly Lungs: No labored breathing, no cough.  CTA  bilaterally Heart: RRR.  No MRG.  S1, S2 present. Abdomen: Soft without distention.  Active bowel sounds.  No HSM, masses, bruits, hernias.   Rectal: Deferred Musc/Skeltl: No joint redness, swelling or gross deformity. Extremities: No CCE Neurologic: In speaking with her there is no gross deficits of speech.  She moves all 4 limbs without tremor, strength was not tested.  Neurologic exam grossly nonfocal. Skin: No rash, no sores, no telangiectasia. Tattoos: None observed Nodes: No cervical adenopathy Psych: Calm, pleasant, cooperative.  In good spirits.  Intake/Output from previous day: No intake/output data recorded. Intake/Output this shift: No intake/output data recorded.  LAB RESULTS: Recent Labs    12/13/20 0312  WBC 11.4*  HGB 10.6*  HCT 35.1*  PLT 400   BMET Lab Results  Component Value Date   NA 132 (L) 12/13/2020   NA 132 (L) 12/11/2020   NA 135 12/10/2020   K 4.5 12/13/2020   K 3.9 12/11/2020   K 4.2 12/10/2020   CL 99 12/13/2020   CL 99 12/11/2020   CL 101 12/10/2020   CO2 25 12/13/2020   CO2 25 12/11/2020   CO2 25 12/10/2020   GLUCOSE 147 (H) 12/13/2020   GLUCOSE 122 (H) 12/11/2020   GLUCOSE 139 (H) 12/10/2020   BUN 14 12/13/2020   BUN 14 12/11/2020   BUN 10 12/10/2020   CREATININE 0.66 12/13/2020   CREATININE 0.58 12/11/2020   CREATININE 0.60 12/10/2020   CALCIUM 9.5 12/13/2020   CALCIUM 9.7 12/11/2020   CALCIUM 9.6 12/10/2020   LFT No results for input(s): PROT, ALBUMIN, AST, ALT, ALKPHOS, BILITOT, BILIDIR, IBILI in the last 72 hours. PT/INR Lab Results  Component Value Date  INR 1.0 12/08/2020   INR 1.1 10/06/2020     RADIOLOGY STUDIES: No results found.    IMPRESSION:   Metastatic melanoma.  Mets to brain and suspicious lymph nodes at celiac axis, gastrohepatic ligament and retroperitoneum.    Aphasia, suspected seizure due to brain mets.  Keppra, Decadron in place.  Iron deficiency anemia.    10/11 evacuation of OD  vitreous hemorrhage.     PLAN:     Upper endoscopic ultrasound with attempt at lymph node biopsy set for 1230 today with Dr. Holly Bodily Glyn Ade  12/14/2020, 8:19 AM Phone [phone]   Attending physician's note   I have taken an interval history, reviewed the chart and examined the patient. I agree with the Advanced Practitioner's note, impression and recommendations.   65yr old with H/O stage 1 melanoma R hip s/p resection Aug 2021, now with mets to Brain, mesenteric lymph nodes, R adrenal gland (?primary, suspected metastatic melanoma). IR cannot access the Lns. GI consulted for EUS guided lymph node biopsy.   Plan: -EUS guided LN Bx (celiac or L gastric) today by Dr. Rush Landmark.  I have discussed with Dr. Rush Landmark.  I also discussed with patient's daughter and son in detail including risks and benefits.  Risks including risks of bleeding, perforation, pancreatitis, risks of anesthesia.  Benefits were also discussed.   Carmell Austria, MD [facility] [phone]

## 2020-12-14 NOTE — Interval H&P Note (Signed)
History and Physical Interval Note:  12/14/2020 11:39 AM  Brand Males  has presented today for surgery, with the diagnosis of Lymphadenopathy.  The various methods of treatment have been discussed with the patient and family. After consideration of risks, benefits and other options for treatment, the patient has consented to  Procedure(s): ESOPHAGOGASTRODUODENOSCOPY (EGD) WITH PROPOFOL (N/A) UPPER ESOPHAGEAL ENDOSCOPIC ULTRASOUND (EUS) (N/A) as a surgical intervention.  The patient's history has been reviewed, patient examined, no change in status, stable for surgery.  I have reviewed the patient's chart and labs.  Questions were answered to the patient's satisfaction.    The risks of an EUS including intestinal perforation, bleeding, infection, aspiration, and medication effects were discussed as was the possibility it may not give a definitive diagnosis if a biopsy is performed.  When a biopsy of the pancreas is done as part of the EUS, there is an additional risk of pancreatitis at the rate of about 1-2%.  It was explained that procedure related pancreatitis is typically mild, although it can be severe and even life threatening, which is why we do not perform random pancreatic biopsies and only biopsy a lesion/area we feel is concerning enough to warrant the risk.    [employer]

## 2020-12-14 NOTE — Progress Notes (Signed)
Speech Language Pathology Treatment: Cognitive-Linquistic  Patient Details Name: Ariel [name] MRN: [id number] DOB: 1955-12-28 Today's Date: 12/14/2020 Time: 7078-6754 SLP Time Calculation (min) (ACUTE ONLY): 30 min  Assessment / Plan / Recommendation Clinical Impression  Pt was seen for therapy targeting language and cognition goals. Pt overall with improved cognition c/b intact memory, awareness, orientation x4, and intact higher level executive functioning skills. Pt receptive language greatly improved from previous targeted session and output c/b far fewer semantic/phonemic paraphasias. Pt and son report greatest difficulty remains with writing. Automatic writing (name, address) functional, however generative writing c/b letter/word additions, letter transpositions, perseveration, and semantic paraphasias. Pt recognizes errored words independently but requires verbal/visual/gestural cues to slow down and assess specific errors and make corrections. SLP educated pt on importance of slowing down and proof reading each sentence before moving on to the next, and pt recalled strategy at the end of the session independently. SLP also introduced idea of providing pt with constant visual cue available on her person to slow down when communication across modalities .Pt's son affirmed his mom's verbal impulsivity, increased rate of speech and verbosity. SLP will continue to follow for therapy targeting self-monitoring/self-correcting writing and speaking errors.    HPI HPI: 65 y.o. F with medical history significant of HLD, IDA, TIA in 09/2020, melanoma of the right hip 2021 stage IB and vitreous hemorrhage of the right eye s/p vitrectomy 12/07/20 who presented to ED for sudden onset nonverbal state and inability to follow commands. Imaging revealed two enhancing lesions in the left posterior temporal lobe and left superior parietal lobule with mild vasogenic edema.      SLP Plan  Goals updated       Recommendations for follow up therapy are one component of a multi-disciplinary discharge planning process, led by the attending physician.  Recommendations may be updated based on patient status, additional functional criteria and insurance authorization.    Recommendations                   Oral Care Recommendations: Oral care BID Follow up Recommendations: Outpatient SLP SLP Visit Diagnosis: Aphasia (R47.01) Plan: Goals updated       GO             Dewitt Rota, SLP-Student    Dewitt Rota  12/14/2020, 10:40 AM

## 2020-12-14 NOTE — Op Note (Signed)
[facility] Patient Name: Ariel [name] Procedure Date : 12/14/2020 MRN: [id number] Attending MD: Justice Britain , MD Date of Birth: [birth year] CSN: [id number] Age: 65 Admit Type: Inpatient Procedure:                Upper EUS Indications:              Lymphadenopathy on CT scan Providers:                Justice Britain, MD, Vista Lawman, RN, Hinton Dyer Referring MD:             Jackquline Denmark, MD, [facility] Medicines:                Monitored Anesthesia Care Complications:            No immediate complications. Estimated Blood Loss:     Estimated blood loss was minimal. Procedure:                Pre-Anesthesia Assessment:                           - Prior to the procedure, a History and Physical                            was performed, and patient medications and                            allergies were reviewed. The patient's tolerance of                            previous anesthesia was also reviewed. The risks                            and benefits of the procedure and the sedation                            options and risks were discussed with the patient.                            All questions were answered, and informed consent                            was obtained. Prior Anticoagulants: The patient has                            taken no previous anticoagulant or antiplatelet                            agents except for aspirin. ASA Grade Assessment:                            III - A patient with severe systemic disease. After                            reviewing the risks and benefits, the patient was  deemed in satisfactory condition to undergo the                            procedure.                           After obtaining informed consent, the endoscope was                            passed under direct vision. Throughout the                            procedure, the patient's blood pressure, pulse, and                             oxygen saturations were monitored continuously. The                            [id number]-[id number] ([id number]) Olympus endoscope was introduced                            through the mouth, and advanced to the second part                            of duodenum. The [id number] ([id number]) Olympus linear                            ultrasound scope was introduced through the mouth,                            and advanced to the duodenum for ultrasound                            examination from the stomach and duodenum. The                            upper EUS was accomplished without difficulty. The                            patient tolerated the procedure. Scope In: Scope Out: Findings:      ENDOSCOPIC FINDING: :      No gross lesions were noted in the entire esophagus.      The Z-line was regular and was found 38 cm from the incisors.      Patchy mildly erythematous mucosa without bleeding was found in the       entire examined stomach. Biopsies were taken with a cold forceps for       histology and Helicobacter pylori testing.      No gross lesions were noted in the duodenal bulb, in the first portion       of the duodenum and in the second portion of the duodenum.      ENDOSONOGRAPHIC FINDING: :      Many malignant-appearing lymph nodes were visualized in the celiac       region (level 20), perigastric region, peripancreatic region and porta  hepatis region. The largest measured 30 mm by 20 mm in maximal       cross-sectional diameter. The nodes were round, hypoechoic and had well       defined margins. Fine needle biopsy was performed. Color Doppler imaging       was utilized prior to needle puncture to confirm a lack of significant       vascular structures within the needle path. Six passes were made with       the 22 gauge ultrasound core biopsy needle using a transgastric       approach. A visible core of tissue was obtained. Preliminary cytologic       examination  and touch preps were performed. The cellularity of the       specimen was adequate. Final cytology results are pending.      There was no sign of significant endosonographic abnormality in the       pancreatic head, genu of the pancreas, pancreatic body and pancreatic       tail. No masses, the pancreatic duct was thin in caliber.      Endosonographic imaging in the visualized portion of the liver showed no       mass.      Moderate hyperechoic material consistent with sludge was visualized       endosonographically in the gallbladder.      The celiac region was visualized. Impression:               EGD Impression:                           - No gross lesions in esophagus. Z-line regular, 38                            cm from the incisors.                           - Erythematous mucosa in the stomach. Biopsied.                           - No gross lesions in the duodenal bulb, in the                            first portion of the duodenum and in the second                            portion of the duodenum.                           EUS Impression:                           - Many malignant-appearing lymph nodes were                            visualized in the celiac region (level 20),                            perigastric region, peripancreatic region and porta  hepatis region. Cytology results are pending.                            However, the endosonographic appearance is highly                            suspicious for an undifferentiated carcinoma. Fine                            needle biopsy performed.                           - There was no sign of significant pathology in the                            pancreatic head, genu of the pancreas, pancreatic                            body and pancreatic tail.                           - Hyperechoic material consistent with sludge was                            visualized endosonographically in the  gallbladder. Recommendation:           - The patient will be observed post-procedure,                            until all discharge criteria are met.                           - Return patient to hospital ward for ongoing care.                           - Advance diet as tolerated.                           - Observe patient's clinical course.                           - Initiate PPI 40 mg daily, especially in setting                            of Steroid use.                           - Await cytology results and await path results.                           - The findings and recommendations were discussed                            with the patient.                           -  The findings and recommendations were discussed                            with the patient's family. Procedure Code(s):        --- Professional ---                           [phone], Esophagogastroduodenoscopy, flexible,                            transoral; with transendoscopic ultrasound-guided                            intramural or transmural fine needle                            aspiration/biopsy(s), (includes endoscopic                            ultrasound examination limited to the esophagus,                            stomach or duodenum, and adjacent structures) Diagnosis Code(s):        --- Professional ---                           K31.89, Other diseases of stomach and duodenum                           I89.9, Noninfective disorder of lymphatic vessels                            and lymph nodes, unspecified                           R59.1, Generalized enlarged lymph nodes                           K83.8, Other specified diseases of biliary tract CPT copyright 2019 American Medical Association. All rights reserved. The codes documented in this report are preliminary and upon coder review may  be revised to meet current compliance requirements. Justice Britain, MD 12/14/2020 2:13:05 PM Number of  Addenda: 0

## 2020-12-14 NOTE — Progress Notes (Signed)
Pt received back on floor and reoriented to the unit.

## 2020-12-14 NOTE — Anesthesia Preprocedure Evaluation (Signed)
Anesthesia Evaluation  Patient identified by MRN, date of birth, ID band Patient awake    Reviewed: Allergy & Precautions, NPO status , Patient's Chart, lab work & pertinent test results  History of Anesthesia Complications (+) PONV and history of anesthetic complications  Airway Mallampati: I  TM Distance: >3 FB Neck ROM: Full    Dental  (+) Dental Advisory Given, Teeth Intact   Pulmonary neg pulmonary ROS, neg shortness of breath, neg sleep apnea, neg COPD, neg recent URI,    breath sounds clear to auscultation       Cardiovascular negative cardio ROS   Rhythm:Regular     Neuro/Psych TIAnegative psych ROS   GI/Hepatic negative GI ROS, Neg liver ROS,   Endo/Other  negative endocrine ROS  Renal/GU negative Renal ROS     Musculoskeletal negative musculoskeletal ROS (+)   Abdominal   Peds  Hematology  (+) Blood dyscrasia, anemia , Lab Results      Component                Value               Date                      WBC                      11.4 (H)            12/13/2020                HGB                      10.6 (L)            12/13/2020                HCT                      35.1 (L)            12/13/2020                MCV                      76.8 (L)            12/13/2020                PLT                      400                 12/13/2020              Anesthesia Other Findings Lymphadenopathy  Reproductive/Obstetrics                             Anesthesia Physical Anesthesia Plan  ASA: 2  Anesthesia Plan: MAC   Post-op Pain Management:    Induction:   PONV Risk Score and Plan: 3 and Propofol infusion and Treatment may vary due to age or medical condition  Airway Management Planned: Nasal Cannula  Additional Equipment: None  Intra-op Plan:   Post-operative Plan:   Informed Consent: I have reviewed the patients History and Physical, chart, labs and discussed the  procedure including the risks, benefits and alternatives for the proposed anesthesia with the patient or  authorized representative who has indicated his/her understanding and acceptance.     Dental advisory given  Plan Discussed with: CRNA and Anesthesiologist  Anesthesia Plan Comments:         Anesthesia Quick Evaluation

## 2020-12-14 NOTE — TOC Initial Note (Signed)
Transition of Care [facility]) - Initial/Assessment Note    Patient Details  Name: Ariel [name] MRN: [id number] Date of Birth: [birth year]  [facility] CM/SW Contact:    Pollie Friar, RN Phone Number: 12/14/2020, 3:42 PM  Clinical Narrative:                 Patient is from home alone. She denies any home DME but does have elevated toilets and grab bars. Patient states she has the resources to have 24 caregivers at home and family is working to have this arranged.   Recommendations are for outpatient therapy. Pt and family prefers home health services as pt will have multiple appointments after d/c. Family provided choice under Medicare.gov on home health agencies.  [facility] to be ordered once closer to d/c.  TOC following.  Expected Discharge Plan: [place] Barriers to Discharge: Continued Medical Work up   Patient Goals and CMS Choice   CMS Medicare.gov Compare Post Acute Care list provided to:: Patient Choice offered to / list presented to : Patient, Adult Children  Expected Discharge Plan and Services Expected Discharge Plan: [place]   Discharge Planning Services: CM Consult Post Acute Care Choice: Durable Medical Equipment, Home Health Living arrangements for the past 2 months: Single Family Home                           HH Arranged: PT, Speech Therapy          Prior Living Arrangements/Services Living arrangements for the past 2 months: Single Family Home Lives with:: Self Patient language and need for interpreter reviewed:: Yes Do you feel safe going back to the place where you live?: Yes      Need for Family Participation in Patient Care: Yes (Comment)     Criminal Activity/Legal Involvement Pertinent to Current Situation/Hospitalization: No - Comment as needed  Activities of Daily Living      Permission Sought/Granted                  Emotional Assessment Appearance:: Appears stated  age Attitude/Demeanor/Rapport: Engaged Affect (typically observed): Accepting Orientation: : Oriented to Self, Oriented to Place, Oriented to  Time, Oriented to Situation   Psych Involvement: No (comment)  Admission diagnosis:  Aphasia [R47.01] Brain tumor ([place]) [D49.6] Brain metastasis ([place]) [C79.31] Vasogenic edema ([place]) [G93.6] Patient Active Problem List   Diagnosis Date Noted   Brain metastasis ([place]) 12/09/2020   Brain tumor ([place]) 12/08/2020   Hyponatremia 12/08/2020   Night sweat 11/30/2020   Chicken pox 34/19/6222   Complication of anesthesia 11/22/2020   PONV (postoperative nausea and vomiting) 11/22/2020   Skin rash 11/10/2020   Tachycardia 11/10/2020   Anemia 11/10/2020   Hyperglycemia 11/10/2020   TIA (transient ischemic attack) 10/07/2020   Iron deficiency anemia 10/07/2020   Aphasia 10/06/2020   Melanoma ([place]) 2021   Multinodular goiter 12/22/2013   Subclinical hyperthyroidism 12/19/2013   Urinary incontinence 11/19/2013   Hyperlipidemia 11/19/2012   Routine general medical examination at a health care facility 11/18/2012   PCP:  [name], NP Pharmacy:   [facility], [place] - [address] [place] [address] Phone: [phone] Fax: [phone]     Social Determinants of Health (SDOH) Interventions    Readmission Risk Interventions No flowsheet data found.

## 2020-12-14 NOTE — Evaluation (Signed)
Occupational Therapy Evaluation Patient Details Name: Ariel [name] MRN: [id number] DOB: 1955-07-28 Today's Date: 12/14/2020   History of Present Illness Ariel [name] is a 65 y.o. female with medical history significant of HLD, IDA, TIA in 09/2020, melanoma of the right hip 2021 stage IB and vitreous hemorrhage of the right eye s/p vitrectomy 12/07/20 who presented to ED for sudden onset nonverbal state and inability to follow commands. Pt found to have metastatic brain disease to L temporal and parietal lobes.   Clinical Impression   [name] was indep prior to the above admission. She lives at home alone, she has 1 STE and can live son the main floor with bed/bath. Upon evaluation pt was mod I for bed mobility and supervision for all OOB mobility without AD. She is also supervision - min guard for ADLs. Her vision should be further assesses in functional context; R visual field impairment noted upon assessment. She is limited by short term memory impairments, writing, and some expressive communication deficits. Pt would benefit from continued OT acutely. Recommend d/c d/c home with supervision initially; or OP nuero pending her progression acutely.      Recommendations for follow up therapy are one component of a multi-disciplinary discharge planning process, led by the attending physician.  Recommendations may be updated based on patient status, additional functional criteria and insurance authorization.   Follow Up Recommendations  No OT follow up;Supervision - Intermittent (pt will possibly need OP neuro follow up depending on her acute progressing)    Equipment Recommendations  None recommended by OT       Precautions / Restrictions Precautions Precautions: Fall Restrictions Weight Bearing Restrictions: No      Mobility Bed Mobility Overal bed mobility: Modified Independent             General bed mobility comments: no assist needed, HOB elevated    Transfers Overall  transfer level: Needs assistance Equipment used: None Transfers: Sit to/from Stand Sit to Stand: Supervision         General transfer comment: no physical assist needed, pt states her BLE feel weaker than normal    Balance Overall balance assessment: Needs assistance Sitting-balance support: Feet supported Sitting balance-Leahy Scale: Good     Standing balance support: No upper extremity supported Standing balance-Leahy Scale: Fair                             ADL either performed or assessed with clinical judgement   ADL Overall ADL's : Needs assistance/impaired Eating/Feeding: Independent;Sitting   Grooming: Supervision/safety;Standing   Upper Body Bathing: Supervision/ safety;Sitting   Lower Body Bathing: Supervison/ safety;Sit to/from stand   Upper Body Dressing : Supervision/safety;Sitting   Lower Body Dressing: Supervision/safety;Sit to/from stand   Toilet Transfer: Supervision/safety;Ambulation;Regular Toilet   Toileting- Clothing Manipulation and Hygiene: Supervision/safety;Sitting/lateral lean       Functional mobility during ADLs: Supervision/safety General ADL Comments: supervision for safety only - verbal cues for visual compensatory techniques. No AD, no LOB, steady on feet - pt reports feeling weak in BLE with poor activity tolerance     Vision Baseline Vision/History: 1 Wears glasses Ability to See in Adequate Light: 0 Adequate Patient Visual Report: Blurring of vision;Central vision impairment Vision Assessment?: Yes Eye Alignment: Within Functional Limits Ocular Range of Motion: Within Functional Limits Alignment/Gaze Preference: Within Defined Limits Tracking/Visual Pursuits: Able to track stimulus in all quads without difficulty Saccades: Within functional limits Convergence: Within functional limits Visual Fields: Other (  comment) (pt unable to see object out of R eye when located just R of center) Additional Comments: Pt with  history of recent R eye surgery     Perception     Praxis      Pertinent Vitals/Pain Pain Assessment: Faces Faces Pain Scale: No hurt     Hand Dominance Right   Extremity/Trunk Assessment Upper Extremity Assessment Upper Extremity Assessment: Overall WFL for tasks assessed   Lower Extremity Assessment Lower Extremity Assessment: Overall WFL for tasks assessed   Cervical / Trunk Assessment Cervical / Trunk Assessment: Normal   Communication Communication Communication: No difficulties   Cognition Arousal/Alertness: Awake/alert Behavior During Therapy: WFL for tasks assessed/performed Overall Cognitive Status: Within Functional Limits for tasks assessed                                     General Comments  VSS on RA, pt's children present and supportive    Exercises     Shoulder Instructions      Home Living Family/patient expects to be discharged to:: Private residence Living Arrangements: Alone Available Help at Discharge: Family;Available PRN/intermittently Type of Home: House Home Access: Stairs to enter Entrance Stairs-Number of Steps: 1   Home Layout: Two level;Able to live on main level with bedroom/bathroom     Bathroom Shower/Tub: Occupational psychologist: Handicapped height     Home Equipment: Shower seat;Grab bars - toilet;Grab bars - tub/shower      Lives With: Alone    Prior Functioning/Environment Level of Independence: Independent                 OT Problem List: Decreased activity tolerance;Impaired balance (sitting and/or standing);Impaired vision/perception;Decreased cognition      OT Treatment/Interventions: Self-care/ADL training;Therapeutic activities;Cognitive remediation/compensation;Visual/perceptual remediation/compensation;Patient/family education;Balance training    OT Goals(Current goals can be found in the care plan section) Acute Rehab OT Goals Patient Stated Goal: get better OT Goal  Formulation: With patient Time For Goal Achievement: 01/04/21 Potential to Achieve Goals: Good ADL Goals Pt/caregiver will Perform Home Exercise Program: Increased strength;Both right and left upper extremity;With written HEP provided Additional ADL Goal #1: Pt will complete all BALDs indepdently Additional ADL Goal #2: Pt will independently use compensatory strategies for low vision to accurately complete medical management task  OT Frequency: Min 2X/week   Barriers to D/C: Decreased caregiver support  pt lives alone       Co-evaluation              AM-PAC OT "6 Clicks" Daily Activity     Outcome Measure Help from another person eating meals?: None Help from another person taking care of personal grooming?: A Little Help from another person toileting, which includes using toliet, bedpan, or urinal?: A Little Help from another person bathing (including washing, rinsing, drying)?: A Little Help from another person to put on and taking off regular upper body clothing?: None Help from another person to put on and taking off regular lower body clothing?: A Little 6 Click Score: 20   End of Session Nurse Communication: Mobility status;Precautions;Weight bearing status  Activity Tolerance: Patient tolerated treatment well Patient left: in bed;with call bell/phone within reach  OT Visit Diagnosis: Unsteadiness on feet (R26.81);Cognitive communication deficit (R41.841) Symptoms and signs involving cognitive functions: Other cerebrovascular disease                Time: 1245-8099 OT Time Calculation (  min): 12 min Charges:  OT General Charges $OT Visit: 1 Visit OT Evaluation $OT Eval Moderate Complexity: 1 Mod   Kiran Carline A Sandrea Boer 12/14/2020, 12:53 PM

## 2020-12-14 NOTE — Transfer of Care (Signed)
Immediate Anesthesia Transfer of Care Note  Patient: Ariel [name]  Procedure(s) Performed: ESOPHAGOGASTRODUODENOSCOPY (EGD) WITH PROPOFOL UPPER ESOPHAGEAL ENDOSCOPIC ULTRASOUND (EUS) BIOPSY FINE NEEDLE ASPIRATION (FNA) LINEAR  Patient Location: Endoscopy Unit  Anesthesia Type:MAC  Level of Consciousness: drowsy and patient cooperative  Airway & Oxygen Therapy: Patient Spontanous Breathing and Patient connected to nasal cannula oxygen  Post-op Assessment: Report given to RN, Post -op Vital signs reviewed and stable and Patient moving all extremities X 4  Post vital signs: Reviewed and stable  Last Vitals:  Vitals Value Taken Time  BP 100/39 12/14/20 1401  Temp    Pulse 64 12/14/20 1403  Resp 17 12/14/20 1403  SpO2 99 % 12/14/20 1403  Vitals shown include unvalidated device data.  Last Pain:  Vitals:   12/14/20 1400  TempSrc:   PainSc: Asleep         Complications: No notable events documented.

## 2020-12-14 NOTE — Consult Note (Addendum)
[facility] Consult: 8:19 AM 12/14/2020  LOS: 6 days    Referring Provider: Dr Fanny Bien  Primary Care Physician:  [name], NP Primary Gastroenterologist:  None    Reason for Consultation:  Request for EUS biopsy of lymph nodes.     HPI: Ariel [name] is a 65 y.o. [name].  PMH hypertension.  Hyperlipidemia.  8/22 TIA.  Stage Ib right hip melanoma resected with negative lymph nodes 2021.  IDA diagnosed within last few weeks, eval w Dr Ammie Dalton 2 weeks ago, stool testing at the office and home was negative for blood.  Remote colonoscopy in [place] but patient does not recall any details or who did this.    12/07/2020 underwent evacuation of R ocular vitreous hemorrhage. The following day presented to ED with acute onset nonverbal state, inability to follow commands.  Seizure was suspected.  MRI confirming brain lesions.  CTAP/chest w adenopathy at the gastrohepatic ligament and celiac axis.  Retroperitoneal adenopathy.  Mets suspected.  Indeterminant R adrenal nodule.  Tiny splenic lesions. LFTs are not elevated.  Low albumin 2.8. Anemic with Hgb 8.6, 10.5 in August, 11.1 mid September.  MCV 74.  Low iron at 15.  Low TIBC, low iron sats.  Ferritin 309.  FOBT negative.   Keppra, Decadron initiated.  GI consulted for attempt to biopsy the lymph nodes.    Patient does not smoke or consume alcoholic beverages.  She is a widow.  Lives near [place].  Her son is very attentive. Family history of melanoma in her sister, she is living 10 years postdiagnosis/excision.  No family history of GI cancers or disorders or blood related disorders.    Past Medical History:  Diagnosis Date   Chicken pox    Complication of anesthesia    Per patient, very slow to wake up after anesthesia   High cholesterol     Hyperglycemia 11/10/2020   Melanoma ([place]) 2021   right hip   PONV (postoperative nausea and vomiting)    Urinary incontinence     Past Surgical History:  Procedure Laterality Date   AIR/FLUID EXCHANGE Right 12/07/2020   Procedure: AIR/FLUID EXCHANGE;  Surgeon: Jalene Mullet, MD;  Location: [place];  Service: Ophthalmology;  Laterality: Right;   APPENDECTOMY  1962   EXCISION MELANOMA WITH SENTINEL LYMPH NODE BIOPSY Right 10/09/2019   Procedure: WIDE LOCAL EXCISION WITH ADVANCEMENT FLAP CLOSURE RIGHT HIP MELANOMA WITH SENTINEL LYMPH NODE BIOPSY;  Surgeon: Stark Klein, MD;  Location: [place];  Service: General;  Laterality: Right;   HERNIA REPAIR  2009   MOUTH SURGERY  11/2015   gum surgery and bone implant   PARS PLANA VITRECTOMY Right 12/07/2020   Procedure: PARS PLANA VITRECTOMY WITH 25 GAUGE;  Surgeon: Jalene Mullet, MD;  Location: [place];  Service: Ophthalmology;  Laterality: Right;   PHOTOCOAGULATION WITH LASER Right 12/07/2020   Procedure: PHOTOCOAGULATION WITH ENDOLASER PANRITINAL COAGULATION;  Surgeon: Jalene Mullet, MD;  Location: [place];  Service: Ophthalmology;  Laterality: Right;   WISDOM TOOTH EXTRACTION      Prior to Admission medications  Medication Sig Start Date End Date Taking? Authorizing Provider  aspirin EC 81 MG tablet Take 81 mg by mouth daily. Swallow whole.   Yes [provider]  betamethasone dipropionate 0.05 % cream Apply topically 2 (two) times daily. Patient not taking: No sig reported 11/10/20   [name], NP    Scheduled Meds:  dexamethasone  4 mg Oral BID   erythromycin   Right Eye BID   levETIRAcetam  500 mg Oral BID   ofloxacin  1 drop Right Eye QID   polyethylene glycol  17 g Oral Daily   prednisoLONE acetate  1 drop Right Eye QID   senna-docusate  1 tablet Oral BID   sodium chloride flush  3 mL Intravenous Q12H   Infusions:  sodium chloride     PRN Meds: sodium chloride, acetaminophen **OR** acetaminophen, sodium  chloride flush   Allergies as of 12/08/2020 - Review Complete 12/08/2020  Allergen Reaction Noted   Codeine Nausea Only 11/19/2013   Plavix [clopidogrel]  11/30/2020    Family History  Problem Relation Age of Onset   Coronary artery disease Father        died at 42   COPD Father    Alcohol abuse Father    Diabetes Mellitus II Father    Coronary artery disease Sister    Cancer Sister        breast   Heart attack Brother    Hyperlipidemia Other        died 70- "natural causes"   Hyperlipidemia Other        4 siblings    Hypertension Other        3 siblings    Social History   Socioeconomic History   Marital status: Widowed    Spouse name: Not on file   Number of children: Not on file   Years of education: Not on file   Highest education level: Not on file  Occupational History   Occupation: retired  Tobacco Use   Smoking status: Never   Smokeless tobacco: Never  Vaping Use   Vaping Use: Never used  Substance and Sexual Activity   Alcohol use: Never   Drug use: Never   Sexual activity: Not Currently  Other Topics Concern   Not on file  Social History Narrative   Lives with husband   One Son- lives locally, 1 grand-daughter   Daughter- Sport and exercise psychologist. [place]   Completed Hotel manager school   Works as a Probation officer.         Social Determinants of Radio broadcast assistant Strain: Low Risk    Difficulty of Paying Living Expenses: Not hard at all  Food Insecurity: No Food Insecurity   Worried About Charity fundraiser in the Last Year: Never true   Arboriculturist in the Last Year: Never true  Transportation Needs: No Transportation Needs   Lack of Transportation (Medical): No   Lack of Transportation (Non-Medical): No  Physical Activity: Sufficiently Active   Days of Exercise per Week: 4 days   Minutes of Exercise per Session: 40 min  Stress: No Stress Concern Present   Feeling of Stress : Not at all  Social Connections: Moderately Isolated   Frequency  of Communication with Friends and Family: More than three times a week   Frequency of Social Gatherings with Friends and Family: More than three times a week   Attends Religious Services: More than 4 times per year   Active Member of  Clubs or Organizations: No   Attends Archivist Meetings: Never   Marital Status: Widowed  Human resources officer Violence: Not At Risk   Fear of Current or Ex-Partner: No   Emotionally Abused: No   Physically Abused: No   Sexually Abused: No    REVIEW OF SYSTEMS: Constitutional: Patient's strength is returning.  Generally she does not suffer from significant fatigue or weakness ENT:  No nose bleeds Pulm: No dyspnea.  No cough. CV:  No palpitations, no LE edema.  No angina. GU:  No hematuria, no frequency GI: See HPI.  No dysphagia.  No altered bowel habits.  No bleeding per rectum. Heme: No unusual or excessive bleeding or bruising but did have the recent ocular hemorrhage. Transfusions: None. Neuro:  No headaches, no peripheral tingling or numbness.  No syncope, no seizures. Derm:  No itching, no rash or sores.  Endocrine:  No sweats or chills.  No polyuria or dysuria Immunization: Reviewed. Travel:  None beyond local counties in last few months.    PHYSICAL EXAM: Vital signs in last 24 hours: Vitals:   12/14/20 0538 12/14/20 0803  BP: 112/63 108/65  Pulse: 64 69  Resp: 17 15  Temp: 97.9 F (36.6 C) (!) 97.5 F (36.4 C)  SpO2: 98% 98%   Wt Readings from Last 3 Encounters:  12/08/20 64.8 kg  12/07/20 64.4 kg  12/01/20 64.9 kg    General: Patient is comfortable, alert, does not look ill. Head: No facial asymmetry or swelling.  No signs of head trauma. Eyes: Remnants of hemorrhage in right eye. Ears: Not hard of hearing Nose: No congestion or discharge Mouth: Good dentition, good dental repair.  Tongue midline.  Mucosa moist, pink, clear. Neck: No JVD, no masses, no thyromegaly Lungs: No labored breathing, no cough.  CTA  bilaterally Heart: RRR.  No MRG.  S1, S2 present. Abdomen: Soft without distention.  Active bowel sounds.  No HSM, masses, bruits, hernias.   Rectal: Deferred Musc/Skeltl: No joint redness, swelling or gross deformity. Extremities: No CCE Neurologic: In speaking with her there is no gross deficits of speech.  She moves all 4 limbs without tremor, strength was not tested.  Neurologic exam grossly nonfocal. Skin: No rash, no sores, no telangiectasia. Tattoos: None observed Nodes: No cervical adenopathy Psych: Calm, pleasant, cooperative.  In good spirits.  Intake/Output from previous day: No intake/output data recorded. Intake/Output this shift: No intake/output data recorded.  LAB RESULTS: Recent Labs    12/13/20 0312  WBC 11.4*  HGB 10.6*  HCT 35.1*  PLT 400   BMET Lab Results  Component Value Date   NA 132 (L) 12/13/2020   NA 132 (L) 12/11/2020   NA 135 12/10/2020   K 4.5 12/13/2020   K 3.9 12/11/2020   K 4.2 12/10/2020   CL 99 12/13/2020   CL 99 12/11/2020   CL 101 12/10/2020   CO2 25 12/13/2020   CO2 25 12/11/2020   CO2 25 12/10/2020   GLUCOSE 147 (H) 12/13/2020   GLUCOSE 122 (H) 12/11/2020   GLUCOSE 139 (H) 12/10/2020   BUN 14 12/13/2020   BUN 14 12/11/2020   BUN 10 12/10/2020   CREATININE 0.66 12/13/2020   CREATININE 0.58 12/11/2020   CREATININE 0.60 12/10/2020   CALCIUM 9.5 12/13/2020   CALCIUM 9.7 12/11/2020   CALCIUM 9.6 12/10/2020   LFT No results for input(s): PROT, ALBUMIN, AST, ALT, ALKPHOS, BILITOT, BILIDIR, IBILI in the last 72 hours. PT/INR Lab Results  Component Value Date  INR 1.0 12/08/2020   INR 1.1 10/06/2020     RADIOLOGY STUDIES: No results found.    IMPRESSION:   Metastatic melanoma.  Mets to brain and suspicious lymph nodes at celiac axis, gastrohepatic ligament and retroperitoneum.    Aphasia, suspected seizure due to brain mets.  Keppra, Decadron in place.  Iron deficiency anemia.    10/11 evacuation of OD  vitreous hemorrhage.     PLAN:     Upper endoscopic ultrasound with attempt at lymph node biopsy set for 1230 today with Dr. Holly Bodily Glyn Ade  12/14/2020, 8:19 AM Phone [phone]   Attending physician's note   I have taken an interval history, reviewed the chart and examined the patient. I agree with the Advanced Practitioner's note, impression and recommendations.   65yr old with H/O stage 1 melanoma R hip s/p resection Aug 2021, now with mets to Brain, mesenteric lymph nodes, R adrenal gland (?primary, suspected metastatic melanoma). IR cannot access the Lns. GI consulted for EUS guided lymph node biopsy.   Plan: -EUS guided LN Bx (celiac or L gastric) today by Dr. Rush Landmark.  I have discussed with Dr. Rush Landmark.  I also discussed with patient's daughter and son in detail including risks and benefits.  Risks including risks of bleeding, perforation, pancreatitis, risks of anesthesia.  Benefits were also discussed.   Carmell Austria, MD [facility] [phone]

## 2020-12-14 NOTE — Progress Notes (Addendum)
PROGRESS NOTE    Ariel [name]  RXV:[id number] DOB: [birth year] DOA: 12/08/2020 PCP: [name], NP  Brief Narrative: 65 year old female with history of hypertension, iron deficiency anemia, TIA, history of melanoma of right hip which was resected in 8/21,, history of vitreous hemorrhage, just underwent right eye vitrectomy 10/11, subconjunctival hemorrhage was brought to the ED 10/12 with acute onset confusion, speech deficits, inability to follow commands and blank stares,, symptoms started the day of admission. -MRI brain noted 2 enhancing lesions in the left cerebral hemisphere larger in the posterior temporal lobe concerning for metastatic disease, edema was noted surrounding the lesions. -Seen by neurosurgery, Oncology and radiation-oncology was consulted, stereotactic radiosurgery was recommended. -Case was discussed at tumor board at [facility] long on 10/17, recommendation was for biopsy prior to XRT -Now gastroenterology to attempt EUS with attempt at gastro-hepatic, celiac axis lymph node biopsy today   Assessment & Plan:   Brain metastasis Suspected melanoma primary, had melanoma resected from right hip in 09/2019, was stage I disease at the time of diagnosis -Neurosurgery Dr. Marcello Moores consulted, recommended stereotactic radiosurgery, felt brain biopsy would be high risk given location -radiation oncology team consulting -Case was discussed at tumor board review at [facility] long yesterday , consensus was need for tissue biopsy to determine further treatment plans including XRT -CT abdomen pelvis noted retroperitoneal lymphadenopathy and some gastro-hepatic lymph nodes, discussed with IR on multiple occasions, they felt retroperitoneal and gastro-hepatic lymph nodes would be difficult to access -discussed with gastroenterology, greatly appreciate GI team, plan for EUS/lymph node biopsy today -Continue Decadron and low-dose Keppra -PT OT eval, PT recommended outpatient therapy and  supervision, OT eval today -TOC consult -Confusion has improved considerably  Aphasia, expressive and receptive Confusion -Secondary to above, improved -Remains on Decadron and Keppra  Right eye vitreous hemorrhage -Status post right vitrectomy and endolaser on 12/08/2010 by Dr. Posey Pronto -With subconjunctival hemorrhage -Continue Pred forte, ofloxacin, erythromycin ophthalmic ointment to right eye -Follow-up with Dr. Posey Pronto  DVT prophylaxis: SCDs Code Status: Full code Family Communication: Discussed with son today Status is: Inpatient  Remains inpatient appropriate because:Inpatient level of care appropriate due to severity of illness  Dispo: The patient is from: Home              Anticipated d/c is to: SNF vs home with supervision              Patient currently is not medically stable to d/c.   Difficult to place patient No   Consultants:  Neurosurgery Neurooncology Dr. Mickeal Skinner Radiation oncology Oncology Dr. Marin Olp Interventional radiology GI  Procedures:   Antimicrobials:    Subjective: -Feels okay, denies any complaints, is appreciative of all the care  Objective: Vitals:   12/13/20 1948 12/14/20 0003 12/14/20 0538 12/14/20 0803  BP: (!) 121/98 121/60 112/63 108/65  Pulse: 69 69 64 69  Resp: 17 18 17 15   Temp: 97.6 F (36.4 C) 98.1 F (36.7 C) 97.9 F (36.6 C) (!) 97.5 F (36.4 C)  TempSrc: Oral Oral Oral Oral  SpO2: 98% 98% 98% 98%  Weight:      Height:       No intake or output data in the 24 hours ending 12/14/20 1119  Filed Weights   12/08/20 0800 12/08/20 1018  Weight: 64.8 kg 64.8 kg    Examination:  General exam: Pleasant middle-aged female sitting up in the recliner, awake alert oriented to self and place, considerable improvement in aphasia noted HEENT: Right eye with subconjunctival hemorrhage CVS:  S1-S2, regular rate rhythm Lungs: Clear bilaterally Abdomen: Soft, nontender, bowel sounds present  Extremities: No edema Neuro:  Improved aphasia, strength in upper and lower extremities within normal limits  Psychiatry: Improved insight and judgment    Data Reviewed:   CBC: Recent Labs  Lab 12/08/20 0855 12/08/20 0857 12/09/20 0400 12/10/20 0303 12/11/20 0536 12/13/20 0312  WBC 10.0  --  12.2* 11.8* 10.4 11.4*  NEUTROABS 7.5  --   --   --   --   --   HGB 10.4* 11.6* 10.8* 11.3* 10.9* 10.6*  HCT 34.7* 34.0* 35.5* 36.5 36.1 35.1*  MCV 78.9*  --  75.9* 75.6* 76.3* 76.8*  PLT 425*  --  439* 435* 460* 256   Basic Metabolic Panel: Recent Labs  Lab 12/08/20 0855 12/08/20 0857 12/09/20 0400 12/10/20 0303 12/11/20 0536 12/13/20 0312  NA 133* 135 135 135 132* 132*  K 4.2 4.2 3.9 4.2 3.9 4.5  CL 99 98 101 101 99 99  CO2 26  --  23 25 25 25   GLUCOSE 131* 129* 113* 139* 122* 147*  BUN 11 12 9 10 14 14   CREATININE 0.62 0.50 0.59 0.60 0.58 0.66  CALCIUM 9.5  --  9.8 9.6 9.7 9.5   GFR: Estimated Creatinine Clearance: 64.8 mL/min (by C-G formula based on SCr of 0.66 mg/dL). Liver Function Tests: Recent Labs  Lab 12/08/20 0855  AST 28  ALT 39  ALKPHOS 84  BILITOT 0.5  PROT 8.1  ALBUMIN 2.8*   No results for input(s): LIPASE, AMYLASE in the last 168 hours. No results for input(s): AMMONIA in the last 168 hours. Coagulation Profile: Recent Labs  Lab 12/08/20 0855  INR 1.0   Cardiac Enzymes: No results for input(s): CKTOTAL, CKMB, CKMBINDEX, TROPONINI in the last 168 hours. BNP (last 3 results) No results for input(s): PROBNP in the last 8760 hours. HbA1C: No results for input(s): HGBA1C in the last 72 hours. CBG: Recent Labs  Lab 12/08/20 0852  GLUCAP 125*   Lipid Profile: No results for input(s): CHOL, HDL, LDLCALC, TRIG, CHOLHDL, LDLDIRECT in the last 72 hours. Thyroid Function Tests: No results for input(s): TSH, T4TOTAL, FREET4, T3FREE, THYROIDAB in the last 72 hours.  Anemia Panel: No results for input(s): VITAMINB12, FOLATE, FERRITIN, TIBC, IRON, RETICCTPCT in the last 72  hours. Urine analysis:    Component Value Date/Time   COLORURINE YELLOW 11/30/2020 1218   APPEARANCEUR CLEAR 11/30/2020 1218   LABSPEC <=1.005 (A) 11/30/2020 1218   PHURINE 6.5 11/30/2020 1218   GLUCOSEU NEGATIVE 11/30/2020 1218   HGBUR SMALL (A) 11/30/2020 1218   BILIRUBINUR NEGATIVE 11/30/2020 1218   BILIRUBINUR neg 06/15/2020 1333   KETONESUR NEGATIVE 11/30/2020 1218   PROTEINUR NEGATIVE 10/06/2020 1844   UROBILINOGEN 0.2 11/30/2020 1218   NITRITE NEGATIVE 11/30/2020 1218   LEUKOCYTESUR NEGATIVE 11/30/2020 1218   Sepsis Labs: @LABRCNTIP (procalcitonin:4,lacticidven:4)  ) Recent Results (from the past 240 hour(s))  Resp Panel by RT-PCR (Flu A&B, Covid)     Status: None   Collection Time: 12/08/20  9:17 AM  Result Value Ref Range Status   SARS Coronavirus 2 by RT PCR NEGATIVE NEGATIVE Final    Comment: (NOTE) SARS-CoV-2 target nucleic acids are NOT DETECTED.  The SARS-CoV-2 RNA is generally detectable in upper respiratory specimens during the acute phase of infection. The lowest concentration of SARS-CoV-2 viral copies this assay can detect is 138 copies/mL. A negative result does not preclude SARS-Cov-2 infection and should not be used as the sole basis for  treatment or other patient management decisions. A negative result may occur with  improper specimen collection/handling, submission of specimen other than nasopharyngeal swab, presence of viral mutation(s) within the areas targeted by this assay, and inadequate number of viral copies(<138 copies/mL). A negative result must be combined with clinical observations, patient history, and epidemiological information. The expected result is Negative.  Fact Sheet for Patients:  EntrepreneurPulse.com.au  Fact Sheet for Healthcare Providers:  IncredibleEmployment.be  This test is no t yet approved or cleared by the [place] FDA and  has been authorized for detection and/or diagnosis  of SARS-CoV-2 by FDA under an Emergency Use Authorization (EUA). This EUA will remain  in effect (meaning this test can be used) for the duration of the COVID-19 declaration under Section 564(b)(1) of the Act, 21 U.S.C.section 360bbb-3(b)(1), unless the authorization is terminated  or revoked sooner.       Influenza A by PCR NEGATIVE NEGATIVE Final   Influenza B by PCR NEGATIVE NEGATIVE Final    Comment: (NOTE) The Xpert Xpress SARS-CoV-2/FLU/RSV plus assay is intended as an aid in the diagnosis of influenza from Nasopharyngeal swab specimens and should not be used as a sole basis for treatment. Nasal washings and aspirates are unacceptable for Xpert Xpress SARS-CoV-2/FLU/RSV testing.  Fact Sheet for Patients: EntrepreneurPulse.com.au  Fact Sheet for Healthcare Providers: IncredibleEmployment.be  This test is not yet approved or cleared by the [place] FDA and has been authorized for detection and/or diagnosis of SARS-CoV-2 by FDA under an Emergency Use Authorization (EUA). This EUA will remain in effect (meaning this test can be used) for the duration of the COVID-19 declaration under Section 564(b)(1) of the Act, 21 U.S.C. section 360bbb-3(b)(1), unless the authorization is terminated or revoked.  Performed at [facility], [address], [place], [place] [address]     Radiology Studies: No results found.   Scheduled Meds:  dexamethasone  4 mg Oral BID   erythromycin   Right Eye BID   levETIRAcetam  500 mg Oral BID   ofloxacin  1 drop Right Eye QID   polyethylene glycol  17 g Oral Daily   prednisoLONE acetate  1 drop Right Eye QID   senna-docusate  1 tablet Oral BID   sodium chloride flush  3 mL Intravenous Q12H   Continuous Infusions:  sodium chloride       LOS: 6 days   Time spent: 20min  Domenic Polite, MD [facility]   12/14/2020, 11:19 AM

## 2020-12-15 ENCOUNTER — Ambulatory Visit: Payer: Medicare Other

## 2020-12-15 ENCOUNTER — Encounter (HOSPITAL_COMMUNITY): Payer: Self-pay | Admitting: Gastroenterology

## 2020-12-15 DIAGNOSIS — G9341 Metabolic encephalopathy: Secondary | ICD-10-CM

## 2020-12-15 DIAGNOSIS — R4701 Aphasia: Secondary | ICD-10-CM

## 2020-12-15 LAB — BASIC METABOLIC PANEL
Anion gap: 9 (ref 5–15)
BUN: 13 mg/dL (ref 8–23)
CO2: 24 mmol/L (ref 22–32)
Calcium: 9.2 mg/dL (ref 8.9–10.3)
Chloride: 97 mmol/L — ABNORMAL LOW (ref 98–111)
Creatinine, Ser: 0.5 mg/dL (ref 0.44–1.00)
GFR, Estimated: >60 mL/min (ref >60)
Glucose, Bld: 149 mg/dL — ABNORMAL HIGH (ref 70–99)
Potassium: 4.3 mmol/L (ref 3.5–5.1)
Sodium: 130 mmol/L — ABNORMAL LOW (ref 135–145)

## 2020-12-15 LAB — CBC
HCT: 33 % — ABNORMAL LOW (ref 36.0–46.0)
Hemoglobin: 10.3 g/dL — ABNORMAL LOW (ref 12.0–15.0)
MCH: 24 pg — ABNORMAL LOW (ref 26.0–34.0)
MCHC: 31.2 g/dL (ref 30.0–36.0)
MCV: 76.9 fL — ABNORMAL LOW (ref 80.0–100.0)
Platelets: 384 10*3/uL (ref 150–400)
RBC: 4.29 MIL/uL (ref 3.87–5.11)
RDW: 18.8 % — ABNORMAL HIGH (ref 11.5–15.5)
WBC: 9.4 10*3/uL (ref 4.0–10.5)
nRBC: 0 % (ref 0.0–0.2)

## 2020-12-15 MED ORDER — CEFTRIAXONE SODIUM 1 G IJ SOLR
1.0000 g | INTRAMUSCULAR | Status: DC
  Administered 2020-12-15: 1 g via INTRAVENOUS
  Filled 2020-12-15: qty 10

## 2020-12-15 NOTE — Progress Notes (Signed)
[facility] PROGRESS NOTE    Progress Note  Ariel [name]  VOZ:[id number] DOB: Sep 08, 1955 DOA: 12/08/2020 PCP: [name], NP     Brief Narrative:   Ariel [name] is an 65 y.o. female past medical history of essential hypertension, iron deficiency anemia, history of a TIA and a history of melanoma of the right hip which was resected on 10/17/2019, who on October 11 underwent a vitrectomy subsequently had a subconjunctival hemorrhage came into the ED for acute onset of confusion, speech deficit and inability to follow commands.  An MRI of the brain showed a left cerebral hemisphere lesion in the posterior temporal lobe with edema.  She was started on IV Keppra and Decadron.  Neurosurgery was consulted as well as radiation oncologist the recommended stereotactic radiosurgery.  Presented at the tumor board they recommended biopsy prior to radiation.  CT of the chest abdomen and pelvis showed prominent lymphadenopathy in the gastrohepatic ligament and celiac axis with retroperitoneal lymphadenopathy.  17 mm right adrenal nodule.  IR was consulted who attempted an EUS on 12/14/2020   Assessment/Plan:   Acute metabolic encephalopathy secondary to metastatic brain disease: Primary concern is melanoma she had a resection on August 2021 it was stage I at the time of diagnosis. Neurosurgery was consulted recommended stereotactic radiosurgery, as he felt brain biopsy will be high risk.  Radiation on oncology team is on board. Post EUS on 12/14/2020. Continue oral Decadron and Keppra. Physical therapy evaluated the patient and recommended home health PT. Confusion has improved significantly. Awaiting neuro and oncology recommendations. She might need to be transfer to [facility] to sterotatic surgery.  Expressive aphasia and receptive: Secondary to above continue Decadron and Keppra.  Right high vitreous hemorrhage: Continue current regimen.  Follow-up with Dr. Posey Pronto as an  outpatient.    DVT prophylaxis: scd Family Communication:son Status is: Inpatient  Remains inpatient appropriate because: Inpatient level of care appropriate due to severity of illness    Code Status:     Code Status Orders  (From admission, onward)           Start     Ordered   12/08/20 1553  Full code  Continuous        12/08/20 1553           Code Status History     Date Active Date Inactive Code Status Order ID Comments User Context   10/07/2020 0306 10/07/2020 2308 Full Code [id number]  Chotiner, Yevonne Aline, MD Inpatient         IV Access:   Peripheral IV   Procedures and diagnostic studies:   No results found.   Medical Consultants:   None.   Subjective:    Ariel [name] feels better no new complains  Objective:    Vitals:   12/14/20 2049 12/14/20 2349 12/15/20 0327 12/15/20 0731  BP: (!) 119/51 130/60 (!) 110/52   Pulse: 75 72 (!) 58 65  Resp: 13 16 18    Temp: 98.2 F (36.8 C) 98.2 F (36.8 C)  97.8 F (36.6 C)  TempSrc: Oral Oral  Oral  SpO2: 100% 96% 97% 99%  Weight:      Height:       SpO2: 99 %   Intake/Output Summary (Last 24 hours) at 12/15/2020 0906 Last data filed at 12/14/2020 1404 Gross per 24 hour  Intake 300 ml  Output --  Net 300 ml   Filed Weights   12/08/20 0800 12/08/20 1018 12/14/20 1208  Weight: 64.8 kg 64.8 kg  64.8 kg    Exam: General exam: In no acute distress. Respiratory system: Good air movement and clear to auscultation. Cardiovascular system: S1 & S2 heard, RRR. No JVD, murmurs, rubs, gallops or clicks.  Gastrointestinal system: Abdomen is nondistended, soft and nontender.  Central nervous system: Alert and oriented. No focal neurological deficits. Extremities: No pedal edema. Skin: No rashes, lesions or ulcers Psychiatry: Judgement and insight appear normal. Mood & affect appropriate.    Data Reviewed:    Labs: Basic Metabolic Panel: Recent Labs  Lab 12/09/20 0400 12/10/20 0303  12/11/20 0536 12/13/20 0312 12/15/20 0359  NA 135 135 132* 132* 130*  K 3.9 4.2 3.9 4.5 4.3  CL 101 101 99 99 97*  CO2 23 25 25 25 24   GLUCOSE 113* 139* 122* 147* 149*  BUN 9 10 14 14 13   CREATININE 0.59 0.60 0.58 0.66 0.50  CALCIUM 9.8 9.6 9.7 9.5 9.2   GFR Estimated Creatinine Clearance: 64.8 mL/min (by C-G formula based on SCr of 0.5 mg/dL). Liver Function Tests: No results for input(s): AST, ALT, ALKPHOS, BILITOT, PROT, ALBUMIN in the last 168 hours. No results for input(s): LIPASE, AMYLASE in the last 168 hours. No results for input(s): AMMONIA in the last 168 hours. Coagulation profile No results for input(s): INR, PROTIME in the last 168 hours. COVID-19 Labs  No results for input(s): DDIMER, FERRITIN, LDH, CRP in the last 72 hours.  Lab Results  Component Value Date   SARSCOV2NAA NEGATIVE 12/08/2020   SARSCOV2NAA NEGATIVE 10/06/2020   [place] NEGATIVE 10/06/2019    CBC: Recent Labs  Lab 12/09/20 0400 12/10/20 0303 12/11/20 0536 12/13/20 0312 12/15/20 0359  WBC 12.2* 11.8* 10.4 11.4* 9.4  HGB 10.8* 11.3* 10.9* 10.6* 10.3*  HCT 35.5* 36.5 36.1 35.1* 33.0*  MCV 75.9* 75.6* 76.3* 76.8* 76.9*  PLT 439* 435* 460* 400 384   Cardiac Enzymes: No results for input(s): CKTOTAL, CKMB, CKMBINDEX, TROPONINI in the last 168 hours. BNP (last 3 results) No results for input(s): PROBNP in the last 8760 hours. CBG: No results for input(s): GLUCAP in the last 168 hours. D-Dimer: No results for input(s): DDIMER in the last 72 hours. Hgb A1c: No results for input(s): HGBA1C in the last 72 hours. Lipid Profile: No results for input(s): CHOL, HDL, LDLCALC, TRIG, CHOLHDL, LDLDIRECT in the last 72 hours. Thyroid function studies: No results for input(s): TSH, T4TOTAL, T3FREE, THYROIDAB in the last 72 hours.  Invalid input(s): FREET3 Anemia work up: No results for input(s): VITAMINB12, FOLATE, FERRITIN, TIBC, IRON, RETICCTPCT in the last 72 hours. Sepsis Labs: Recent  Labs  Lab 12/10/20 0303 12/11/20 0536 12/13/20 0312 12/15/20 0359  WBC 11.8* 10.4 11.4* 9.4   Microbiology Recent Results (from the past 240 hour(s))  Resp Panel by RT-PCR (Flu A&B, Covid)     Status: None   Collection Time: 12/08/20  9:17 AM  Result Value Ref Range Status   SARS Coronavirus 2 by RT PCR NEGATIVE NEGATIVE Final    Comment: (NOTE) SARS-CoV-2 target nucleic acids are NOT DETECTED.  The SARS-CoV-2 RNA is generally detectable in upper respiratory specimens during the acute phase of infection. The lowest concentration of SARS-CoV-2 viral copies this assay can detect is 138 copies/mL. A negative result does not preclude SARS-Cov-2 infection and should not be used as the sole basis for treatment or other patient management decisions. A negative result may occur with  improper specimen collection/handling, submission of specimen other than nasopharyngeal swab, presence of viral mutation(s) within the areas targeted  by this assay, and inadequate number of viral copies(<138 copies/mL). A negative result must be combined with clinical observations, patient history, and epidemiological information. The expected result is Negative.  Fact Sheet for Patients:  EntrepreneurPulse.com.au  Fact Sheet for Healthcare Providers:  IncredibleEmployment.be  This test is no t yet approved or cleared by the [place] FDA and  has been authorized for detection and/or diagnosis of SARS-CoV-2 by FDA under an Emergency Use Authorization (EUA). This EUA will remain  in effect (meaning this test can be used) for the duration of the COVID-19 declaration under Section 564(b)(1) of the Act, 21 U.S.C.section 360bbb-3(b)(1), unless the authorization is terminated  or revoked sooner.       Influenza A by PCR NEGATIVE NEGATIVE Final   Influenza B by PCR NEGATIVE NEGATIVE Final    Comment: (NOTE) The Xpert Xpress SARS-CoV-2/FLU/RSV plus assay is intended  as an aid in the diagnosis of influenza from Nasopharyngeal swab specimens and should not be used as a sole basis for treatment. Nasal washings and aspirates are unacceptable for Xpert Xpress SARS-CoV-2/FLU/RSV testing.  Fact Sheet for Patients: EntrepreneurPulse.com.au  Fact Sheet for Healthcare Providers: IncredibleEmployment.be  This test is not yet approved or cleared by the [place] FDA and has been authorized for detection and/or diagnosis of SARS-CoV-2 by FDA under an Emergency Use Authorization (EUA). This EUA will remain in effect (meaning this test can be used) for the duration of the COVID-19 declaration under Section 564(b)(1) of the Act, 21 U.S.C. section 360bbb-3(b)(1), unless the authorization is terminated or revoked.  Performed at [facility], [place] [address], [place], [place] [address]      Medications:    dexamethasone  4 mg Oral BID   erythromycin   Right Eye BID   levETIRAcetam  500 mg Oral BID   ofloxacin  1 drop Right Eye QID   pantoprazole  40 mg Oral Daily   polyethylene glycol  17 g Oral Daily   prednisoLONE acetate  1 drop Right Eye QID   senna-docusate  1 tablet Oral BID   sodium chloride flush  3 mL Intravenous Q12H   Continuous Infusions:  sodium chloride        LOS: 7 days   Charlynne Cousins  [facility]  12/15/2020, 9:06 AM

## 2020-12-15 NOTE — Care Management Important Message (Signed)
Important Message  Patient Details  Name: Ariel [name] MRN: [id number] Date of Birth: 1955-06-21   Medicare Important Message Given:  Yes     Natasha Burda Montine Circle 12/15/2020, 3:41 PM

## 2020-12-15 NOTE — Progress Notes (Signed)
Occupational Therapy Treatment Patient Details Name: Ariel [name] MRN: [id number] DOB: February 08, 1956 Today's Date: 12/15/2020   History of present illness Ariel [name] is a 65 y.o. female with medical history significant of HLD, IDA, TIA in 09/2020, melanoma of the right hip 2021 stage IB and vitreous hemorrhage of the right eye s/p vitrectomy 12/07/20 who presented to ED for sudden onset nonverbal state and inability to follow commands. Pt found to have metastatic brain disease to L temporal and parietal lobes.   OT comments  Ariel [name] has met all of her acute OT goals. She states that her vision continues to get better daily, and verbalized compensatory techniques used for reading and writing accurately. Pt is mod I for all ADLs and functional room ambulation. Pt was left with word searched and suduko puzzles to encourage visual challenge. Would continue to encourage intermittent supervision at d/c. OT to sign off acutely.    Recommendations for follow up therapy are one component of a multi-disciplinary discharge planning process, led by the attending physician.  Recommendations may be updated based on patient status, additional functional criteria and insurance authorization.    Follow Up Recommendations  No OT follow up;Supervision - Intermittent    Equipment Recommendations  None recommended by OT       Precautions / Restrictions Precautions Precautions: Fall Restrictions Weight Bearing Restrictions: No       Mobility Bed Mobility Overal bed mobility: Modified Independent                  Transfers Overall transfer level: Needs assistance Equipment used: None   Sit to Stand: Modified independent (Device/Increase time)         General transfer comment: mod I level this session    Balance Overall balance assessment: Modified Independent           ADL either performed or assessed with clinical judgement   ADL Overall ADL's : Needs assistance/impaired        General ADL Comments: session spent going over higher level cognitive tasks and vision - word search and suduko puzzles. Pt reports her R eye visision is getting better each day.     Vision   Vision Assessment?: Yes Additional Comments: Pt able to read and write [facility] - given some tasks to complete to continue to challenge vision          Cognition Arousal/Alertness: Awake/alert Behavior During Therapy: WFL for tasks assessed/performed Overall Cognitive Status: Within Functional Limits for tasks assessed                        General Comments VSS  on RA - children present    Pertinent Vitals/ Pain       Pain Assessment: No/denies pain Pain Location: pt stated she had a mild headache ealier - resolved   Frequency  Min 2X/week        Progress Toward Goals  OT Goals(current goals can now be found in the care plan section)  Progress towards OT goals: Goals met/education completed, patient discharged from OT  Acute Rehab OT Goals Patient Stated Goal: get better OT Goal Formulation: With patient Time For Goal Achievement: 01/04/21 Potential to Achieve Goals: Good ADL Goals Pt/caregiver will Perform Home Exercise Program: Increased strength;Both right and left upper extremity;With written HEP provided Additional ADL Goal #1: Pt will complete all BALDs indepdently Additional ADL Goal #2: Pt will independently use compensatory strategies for low vision to accurately complete medical management task  Plan All goals met and education completed, patient discharged from [facility] OT "6 Clicks" Daily Activity     Outcome Measure   Help from another person eating meals?: None Help from another person taking care of personal grooming?: None Help from another person toileting, which includes using toliet, bedpan, or urinal?: None Help from another person bathing (including washing, rinsing, drying)?: None Help from another person to put on and taking off  regular upper body clothing?: None Help from another person to put on and taking off regular lower body clothing?: None 6 Click Score: 24       Activity Tolerance Patient tolerated treatment well   Patient Left in chair;with call bell/phone within reach;with family/visitor present   Nurse Communication Mobility status;Precautions;Weight bearing status        Time: 9407-6808 OT Time Calculation (min): 8 min  Charges: OT General Charges $OT Visit: 1 Visit OT Treatments $Self Care/Home Management : 8-22 mins   Jakyrie Totherow A Bilaal Leib 12/15/2020, 5:45 PM

## 2020-12-16 ENCOUNTER — Encounter: Payer: Self-pay | Admitting: Hematology & Oncology

## 2020-12-16 ENCOUNTER — Telehealth: Payer: Self-pay | Admitting: Family

## 2020-12-16 ENCOUNTER — Encounter: Payer: Self-pay | Admitting: Gastroenterology

## 2020-12-16 ENCOUNTER — Other Ambulatory Visit (HOSPITAL_COMMUNITY): Payer: Self-pay

## 2020-12-16 DIAGNOSIS — D500 Iron deficiency anemia secondary to blood loss (chronic): Secondary | ICD-10-CM

## 2020-12-16 LAB — SURGICAL PATHOLOGY

## 2020-12-16 MED ORDER — CEPHALEXIN 500 MG PO CAPS
500.0000 mg | ORAL_CAPSULE | Freq: Four times a day (QID) | ORAL | 0 refills | Status: AC
  Filled 2020-12-16: qty 12, 3d supply, fill #0

## 2020-12-16 MED ORDER — DEXAMETHASONE 4 MG PO TABS
4.0000 mg | ORAL_TABLET | Freq: Two times a day (BID) | ORAL | 1 refills | Status: DC
  Filled 2020-12-16: qty 60, 30d supply, fill #0

## 2020-12-16 MED ORDER — LEVETIRACETAM 500 MG PO TABS
500.0000 mg | ORAL_TABLET | Freq: Two times a day (BID) | ORAL | 2 refills | Status: DC
  Filled 2020-12-16: qty 60, 30d supply, fill #0

## 2020-12-16 NOTE — TOC Transition Note (Signed)
Transition of Care [facility]) - CM/SW Discharge Note   Patient Details  Name: Jeralynn Vaquera MRN: [id number] Date of Birth: 1955-08-03  [facility] CM/SW Contact:  Pollie Friar, RN Phone Number: 12/16/2020, 10:17 AM   Clinical Narrative:    Patient is discharging home with home health services through [place]. Cory with [facility] aware of d/c home today.  Walker and 3 in 1 to be delivered to the room per [employer].  Pt has 24 hour supervision with her family and they also are planning to add caregivers.  Pt has transport home.   Final next level of care: Home w Home Health Services Barriers to Discharge: No Barriers Identified   Patient Goals and CMS Choice   CMS Medicare.gov Compare Post Acute Care list provided to:: Patient Represenative (must comment) Choice offered to / list presented to : Adult Children  Discharge Placement                       Discharge Plan and Services   Discharge Planning Services: CM Consult Post Acute Care Choice: Durable Medical Equipment, Home Health          DME Arranged: 3-N-1, [place] rolling DME Agency: [employer] Date DME Agency Contacted: 12/16/20   Representative spoke with at DME Agency: Freda Munro [facility] Arranged: PT, Speech Therapy [place] Agency: [place] Date [place]: 12/16/20   Representative spoke with at [place]: [place] ([place]) Interventions     Readmission Risk Interventions No flowsheet data found.

## 2020-12-16 NOTE — Discharge Summary (Signed)
Physician Discharge Summary  Ariel [name] GYF:[id number] DOB: [birth year] DOA: 12/08/2020  PCP: [name], NP  Admit date: 12/08/2020 Discharge date: 12/16/2020  Admitted From: Home Disposition:  Home  Recommendations for Outpatient Follow-up:  Follow up with Oncology in 1-2 weeks Please obtain BMP/CBC in one week   Home Health:no Equipment/Devices:None  Discharge Condition:Stable CODE STATUS:Full Diet recommendation: Heart Healthy   Brief/Interim Summary: 65 y.o. female past medical history of essential hypertension, iron deficiency anemia, history of a TIA and a history of melanoma of the right hip which was resected on 10/17/2019, who on October 11 underwent a vitrectomy subsequently had a subconjunctival hemorrhage came into the ED for acute onset of confusion, speech deficit and inability to follow commands.  An MRI of the brain showed a left cerebral hemisphere lesion in the posterior temporal lobe with edema.  She was started on IV Keppra and Decadron.  Neurosurgery was consulted as well as radiation oncologist the recommended stereotactic radiosurgery.  Presented at the tumor board they recommended biopsy prior to radiation.  CT of the chest abdomen and pelvis showed prominent lymphadenopathy in the gastrohepatic ligament and celiac axis with retroperitoneal lymphadenopathy.  17 mm right adrenal nodule.  GI was consulted who attempted an EUS on 12/14/2020  Discharge Diagnoses:  Principal Problem:   Brain tumor [facility]) Active Problems:   Hyperlipidemia   Melanoma ([place])   Aphasia   Iron deficiency anemia   Hyponatremia   Acute metabolic encephalopathy  Acute metabolic encephalopathy secondary to metastatic brain cancer: The primary concern is melanoma she had a resected in August 2021 with a stage I. Neurosurgery was consulted recommended stereotactic radiosurgery, no  brain biopsy would be too high risk. Radiation oncology team is on board. GI was consulted who  performed an EUS with biopsy on 12/14/2020 which results are pending at the time of discharge. On admission she was started on IV Decadron and Keppra and her symptoms improved she will continue Decadron and Keppra as an outpatient. Physical therapy evaluated the patient and recommended skilled nursing facility. Her confusion did resolve.   Expressive aphasia and receptive: Secondary to above continue Decadron and Keppra.  Right vitreous hemorrhage: Continue to follow-up with Dr. Posey Pronto as an outpatient.  Discharge Instructions  Discharge Instructions     Diet - low sodium heart healthy   Complete by: As directed    Increase activity slowly   Complete by: As directed    No wound care   Complete by: As directed       Allergies as of 12/16/2020       Reactions   Codeine Nausea Only   Plavix [clopidogrel]    Hives,lip swelling        Medication List     TAKE these medications    aspirin EC 81 MG tablet Take 81 mg by mouth daily. Swallow whole.   betamethasone dipropionate 0.05 % cream Apply topically 2 (two) times daily.   cephALEXin 500 MG capsule Commonly known as: KEFLEX Take 1 capsule (500 mg total) by mouth 4 (four) times daily for 3 days.   dexamethasone 4 MG tablet Commonly known as: DECADRON Take 1 tablet (4 mg total) by mouth 2 (two) times daily.   levETIRAcetam 500 MG tablet Commonly known as: KEPPRA Take 1 tablet (500 mg total) by mouth 2 (two) times daily.               Durable Medical Equipment  (From admission, onward)  Start     Ordered   12/15/20 0848  For home use only DME 3 n 1  Once        12/15/20 0847   12/14/20 1540  For home use only DME Walker rolling  Once       Question Answer Comment  Walker: With 5 Inch Wheels   Patient needs a walker to treat with the following condition Brain tumor ([place])      12/14/20 1540            Allergies  Allergen Reactions   Codeine Nausea Only   Plavix [Clopidogrel]      Hives,lip swelling     Consultations: Oncology Radiation oncology Neurosurgery   Procedures/Studies: MR BRAIN W WO CONTRAST  Addendum Date: 12/13/2020   ADDENDUM REPORT: 12/13/2020 10:56 ADDENDUM: There is additional ill-defined enhancement in the left postcentral gyrus with associated diffusion restriction suspicious for an additional metastatic lesion (9-43). This was discussed at multidisciplinary conference on the morning of 12/13/2020. Electronically Signed   By: Valetta Mole M.D.   On: 12/13/2020 10:56   Result Date: 12/13/2020 CLINICAL DATA:  History of melanoma, evaluate for metastatic disease EXAM: MRI HEAD WITHOUT AND WITH CONTRAST TECHNIQUE: Multiplanar, multiecho pulse sequences of the brain and surrounding structures were obtained without and with intravenous contrast. CONTRAST:  55mL GADAVIST GADOBUTROL 1 MMOL/ML IV SOLN COMPARISON:  Same-day noncontrast CT head, brain MRI 10/07/2020 FINDINGS: Brain: There is an enhancing lesion in the left posterior temporal lobe measuring 1.9 cm by 1.9 cm x 2.2 cm consistent with metastatic disease. There is surrounding vasogenic edema resulting in effacement of the adjacent sulci and slight effacement of the occipital horn of the left lateral ventricle. There is an additional enhancing lesion in the left superior parietal lobule measuring approximately 0.9 cm TV by 1.3 cm cc in the coronal plane. The lesion is not well evaluated in the axial plane due to motion artifact. There is mild vasogenic edema surrounding this lesion without significant mass effect. No other enhancing lesions are seen. There is no evidence of acute intracranial hemorrhage or infarct. A small focus of susceptibility in the left frontal lobe is nonspecific. There is no midline shift. Vascular: Normal flow voids. Skull and upper cervical spine: There is no definite abnormal signal abnormality, though the upper cervical spine is not well evaluated due to significant motion  artifact. Sinuses/Orbits: The paranasal sinuses are clear. T2 hypointensity in the right globe is consistent with tear related to a prior procedure as seen on prior CT. Other: None. IMPRESSION: Two enhancing lesions in the left cerebral hemisphere, the larger in the posterior temporal lobe measuring up to 1.9 cm x 1.9 cm x 2.2 cm most concerning for metastatic disease given the patient's history and multiplicity. There is edema surrounding the lesions, more so in the left temporal lobe, without midline shift. Electronically Signed: By: Valetta Mole M.D. On: 12/08/2020 12:09   CT CHEST ABDOMEN PELVIS W CONTRAST  Result Date: 12/09/2020 CLINICAL DATA:  Melanoma of the right hip. Now with brain metastases. Staging. EXAM: CT CHEST, ABDOMEN, AND PELVIS WITH CONTRAST TECHNIQUE: Multidetector CT imaging of the chest, abdomen and pelvis was performed following the standard protocol during bolus administration of intravenous contrast. CONTRAST:  32mL OMNIPAQUE IOHEXOL 350 MG/ML SOLN COMPARISON:  None. FINDINGS: CT CHEST FINDINGS Cardiovascular: The heart size is normal. No substantial pericardial effusion. Mediastinum/Nodes: No mediastinal lymphadenopathy. There is no hilar lymphadenopathy. The esophagus has normal imaging features. There is no axillary  lymphadenopathy. Lungs/Pleura: Minimal biapical pleuroparenchymal scarring. No suspicious pulmonary nodule or mass. No focal airspace consolidation. No pleural effusion. Musculoskeletal: No worrisome lytic or sclerotic osseous abnormality. CT ABDOMEN PELVIS FINDINGS Hepatobiliary: No suspicious focal abnormality within the liver parenchyma. There is no evidence for gallstones, gallbladder wall thickening, or pericholecystic fluid. No intrahepatic or extrahepatic biliary dilation. Pancreas: No focal mass lesion. No dilatation of the main duct. No intraparenchymal cyst. No peripancreatic edema. Spleen: 8 mm hypoattenuating lesion noted posterior spleen on 21/3 with 9 mm  hypoattenuating lesion anterior spleen on 22/3. Subtle tiny hypoattenuating inferior splenic lesion on 30/3. Adrenals/Urinary Tract: 17 mm right adrenal nodule evident. Kidneys unremarkable No evidence for hydroureter. The urinary bladder appears normal for the degree of distention. Stomach/Bowel: Stomach is unremarkable. No gastric wall thickening. No evidence of outlet obstruction. Duodenum is normally positioned as is the ligament of Treitz. No small bowel wall thickening. No small bowel dilatation. The terminal ileum is normal. No gross colonic mass. No colonic wall thickening. Vascular/Lymphatic: No abdominal aortic aneurysm relatively bulky lymphadenopathy is seen in the gastrohepatic ligament and celiac axis. 16 mm short axis node visible on 21/3. 16 mm lesion noted on 20/3. 12 mm short axis lymph node identified in the portal caval space on 27/3. Retroperitoneal lymphadenopathy evident with 13 mm short axis left para-aortic node on 30/3. No pelvic sidewall lymphadenopathy. Somewhat bulky Reproductive: The uterus is unremarkable.  There is no adnexal mass. Other: No intraperitoneal free fluid. Musculoskeletal: No worrisome lytic or sclerotic osseous abnormality. IMPRESSION: 1. Prominent lymphadenopathy in the gastrohepatic ligament and celiac axis with retroperitoneal lymphadenopathy. Imaging features compatible with metastatic disease. PET-CT may prove helpful to further evaluate, as clinically warranted. 2. 17 mm right adrenal nodule, indeterminate, but metastatic disease not excluded. 3. Tiny hypoattenuating splenic lesions. Attention on follow-up recommended. Electronically Signed   By: Misty Stanley M.D.   On: 12/09/2020 21:48   CT HEAD CODE STROKE WO CONTRAST  Result Date: 12/08/2020 CLINICAL DATA:  Code stroke. EXAM: CT HEAD WITHOUT CONTRAST TECHNIQUE: Contiguous axial images were obtained from the base of the skull through the vertex without intravenous contrast. COMPARISON:  Brain MRI 10/07/2020  FINDINGS: Brain: There is edema in the left temporal lobe posteriorly which appears to spare the overlying gray matter. There is effacement of the surrounding sulci and partial effacement of the occipital horn of the left lateral ventricle. There is dish in all hypodensity in the left superior parietal lobule with sparing of the overlying cortex (5-49). There is no evidence of acute intracranial hemorrhage. There is no extra-axial fluid collection. There is no midline shift. Vascular: There is mild calcification of the intracranial ICAs. There is no dense vessel. Skull: Normal. Negative for fracture or focal lesion. Sinuses/Orbits: There is air in the right globe which is new since the prior study, likely postprocedural. The imaged paranasal sinuses are clear. Other: None. ASPECTS [facility] Stroke Program Early CT Score) - Ganglionic level infarction (caudate, lentiform nuclei, internal capsule, insula, M1-M3 cortex): 7 - Supraganglionic infarction (M4-M6 cortex): 3 Total score (0-10 with 10 being normal): 10 IMPRESSION: 1. Edema in the left temporal and parietal lobes which appears to spare the overlying cortex is felt unlikely to be related to evolving infarct, and is more concerning for underlying mass lesion given the history of mono melanoma. Recommend MRI of the brain with and without contrast for further evaluation. 2. ASPECTS is 10 3. Air in the right globe is likely postprocedural. These results were called by telephone at  the time of interpretation on 12/08/2020 at 9:16 am to provider Dr Theda Sers , who verbally acknowledged these results. Electronically Signed   By: Valetta Mole M.D.   On: 12/08/2020 09:17   CT ANGIO HEAD NECK W WO CM W PERF (CODE STROKE)  Result Date: 12/08/2020 EXAM: CT ANGIOGRAPHY HEAD AND NECK CT PERFUSION BRAIN TECHNIQUE: Multidetector CT imaging of the head and neck was performed using the standard protocol during bolus administration of intravenous contrast. Multiplanar CT image  reconstructions and MIPs were obtained to evaluate the vascular anatomy. Carotid stenosis measurements (when applicable) are obtained utilizing NASCET criteria, using the distal internal carotid diameter as the denominator. Multiphase CT imaging of the brain was performed following IV bolus contrast injection. Subsequent parametric perfusion maps were calculated using RAPID software. CONTRAST:  139mL OMNIPAQUE IOHEXOL 350 MG/ML SOLN COMPARISON:  Same day CT head, brain MRI 10/07/2020, CTA head/neck 10/06/2020 FINDINGS: CTA NECK FINDINGS Aortic arch: Standard branching. Imaged portion shows no evidence of aneurysm or dissection. No significant stenosis of the major arch vessel origins. Right carotid system: No evidence of dissection, stenosis (50% or greater) or occlusion. Left carotid system: No evidence of dissection, stenosis (50% or greater) or occlusion. Vertebral arteries: Codominant. No evidence of dissection, stenosis (50% or greater) or occlusion. Skeleton: There is mild multilevel degenerative change of the cervical spine, most advanced at C5-C6. There is no suspicious osseous lesion. Other neck: The soft tissues are unremarkable. There is no pathologic lymphadenopathy in the neck. Upper chest: The lung apices are clear. Review of the MIP images confirms the above findings CTA HEAD FINDINGS Anterior circulation: There is mild calcification of the bilateral intracranial ICAs without significant stenosis or occlusion. The bilateral MCAs are patent. The bilateral ACAs are patent. There is no aneurysm. Posterior circulation: The bilateral V4 segments are patent. The basilar artery is patent. The bilateral PCAs are patent. There is no aneurysm. Venous sinuses: Patent. Anatomic variants: There is a prominent posterior communicating artery on the right. Review of the MIP images confirms the above findings CT Brain Perfusion Findings: CBF (<30%) Volume: 54mL Perfusion (Tmax>6.0s) volume: 62mL Mismatch Volume: N/a  Infarction Location:N/a IMPRESSION: 1. Patent vasculature of the head and neck with no significant stenosis or occlusion. 2. No infarct core or penumbra identified. Electronically Signed   By: Valetta Mole M.D.   On: 12/08/2020 09:26     Subjective: No complaints  Discharge Exam: Vitals:   12/16/20 0107 12/16/20 0838  BP: (!) 114/50 121/75  Pulse: 81 67  Resp: 15 16  Temp: 98.8 F (37.1 C) 97.8 F (36.6 C)  SpO2: 100% 100%   Vitals:   12/15/20 0731 12/15/20 2053 12/16/20 0107 12/16/20 0838  BP:  104/61 (!) 114/50 121/75  Pulse: 65 (!) 56 81 67  Resp:  16 15 16   Temp: 97.8 F (36.6 C) 97.9 F (36.6 C) 98.8 F (37.1 C) 97.8 F (36.6 C)  TempSrc: Oral Oral Oral Oral  SpO2: 99% 97% 100% 100%  Weight:      Height:        General: Pt is alert, awake, not in acute distress Cardiovascular: RRR, S1/S2 +, no rubs, no gallops Respiratory: CTA bilaterally, no wheezing, no rhonchi Abdominal: Soft, NT, ND, bowel sounds + Extremities: no edema, no cyanosis    The results of significant diagnostics from this hospitalization (including imaging, microbiology, ancillary and laboratory) are listed below for reference.     Microbiology: Recent Results (from the past 240 hour(s))  Resp Panel  by RT-PCR (Flu A&B, Covid)     Status: None   Collection Time: 12/08/20  9:17 AM  Result Value Ref Range Status   SARS Coronavirus 2 by RT PCR NEGATIVE NEGATIVE Final    Comment: (NOTE) SARS-CoV-2 target nucleic acids are NOT DETECTED.  The SARS-CoV-2 RNA is generally detectable in upper respiratory specimens during the acute phase of infection. The lowest concentration of SARS-CoV-2 viral copies this assay can detect is 138 copies/mL. A negative result does not preclude SARS-Cov-2 infection and should not be used as the sole basis for treatment or other patient management decisions. A negative result may occur with  improper specimen collection/handling, submission of specimen other than  nasopharyngeal swab, presence of viral mutation(s) within the areas targeted by this assay, and inadequate number of viral copies(<138 copies/mL). A negative result must be combined with clinical observations, patient history, and epidemiological information. The expected result is Negative.  Fact Sheet for Patients:  EntrepreneurPulse.com.au  Fact Sheet for Healthcare Providers:  IncredibleEmployment.be  This test is no t yet approved or cleared by the [place] FDA and  has been authorized for detection and/or diagnosis of SARS-CoV-2 by FDA under an Emergency Use Authorization (EUA). This EUA will remain  in effect (meaning this test can be used) for the duration of the COVID-19 declaration under Section 564(b)(1) of the Act, 21 U.S.C.section 360bbb-3(b)(1), unless the authorization is terminated  or revoked sooner.       Influenza A by PCR NEGATIVE NEGATIVE Final   Influenza B by PCR NEGATIVE NEGATIVE Final    Comment: (NOTE) The Xpert Xpress SARS-CoV-2/FLU/RSV plus assay is intended as an aid in the diagnosis of influenza from Nasopharyngeal swab specimens and should not be used as a sole basis for treatment. Nasal washings and aspirates are unacceptable for Xpert Xpress SARS-CoV-2/FLU/RSV testing.  Fact Sheet for Patients: EntrepreneurPulse.com.au  Fact Sheet for Healthcare Providers: IncredibleEmployment.be  This test is not yet approved or cleared by the [place] FDA and has been authorized for detection and/or diagnosis of SARS-CoV-2 by FDA under an Emergency Use Authorization (EUA). This EUA will remain in effect (meaning this test can be used) for the duration of the COVID-19 declaration under Section 564(b)(1) of the Act, 21 U.S.C. section 360bbb-3(b)(1), unless the authorization is terminated or revoked.  Performed at [facility], [place] [address] [place], [place] [address]       Labs: BNP (last 3 results) No results for input(s): BNP in the last 8760 hours. Basic Metabolic Panel: Recent Labs  Lab 12/10/20 0303 12/11/20 0536 12/13/20 0312 12/15/20 0359  NA 135 132* 132* 130*  K 4.2 3.9 4.5 4.3  CL 101 99 99 97*  CO2 25 25 25 24   GLUCOSE 139* 122* 147* 149*  BUN 10 14 14 13   CREATININE 0.60 0.58 0.66 0.50  CALCIUM 9.6 9.7 9.5 9.2   Liver Function Tests: No results for input(s): AST, ALT, ALKPHOS, BILITOT, PROT, ALBUMIN in the last 168 hours. No results for input(s): LIPASE, AMYLASE in the last 168 hours. No results for input(s): AMMONIA in the last 168 hours. CBC: Recent Labs  Lab 12/10/20 0303 12/11/20 0536 12/13/20 0312 12/15/20 0359  WBC 11.8* 10.4 11.4* 9.4  HGB 11.3* 10.9* 10.6* 10.3*  HCT 36.5 36.1 35.1* 33.0*  MCV 75.6* 76.3* 76.8* 76.9*  PLT 435* 460* 400 384   Cardiac Enzymes: No results for input(s): CKTOTAL, CKMB, CKMBINDEX, TROPONINI in the last 168 hours. BNP: Invalid input(s): POCBNP CBG: No  results for input(s): GLUCAP in the last 168 hours. D-Dimer No results for input(s): DDIMER in the last 72 hours. Hgb A1c No results for input(s): HGBA1C in the last 72 hours. Lipid Profile No results for input(s): CHOL, HDL, LDLCALC, TRIG, CHOLHDL, LDLDIRECT in the last 72 hours. Thyroid function studies No results for input(s): TSH, T4TOTAL, T3FREE, THYROIDAB in the last 72 hours.  Invalid input(s): FREET3 Anemia work up No results for input(s): VITAMINB12, FOLATE, FERRITIN, TIBC, IRON, RETICCTPCT in the last 72 hours. Urinalysis    Component Value Date/Time   COLORURINE YELLOW 12/14/2020 1730   APPEARANCEUR HAZY (A) 12/14/2020 1730   LABSPEC 1.019 12/14/2020 1730   PHURINE 6.0 12/14/2020 1730   GLUCOSEU NEGATIVE 12/14/2020 1730   GLUCOSEU NEGATIVE 11/30/2020 1218   HGBUR SMALL (A) 12/14/2020 1730   BILIRUBINUR NEGATIVE 12/14/2020 1730   BILIRUBINUR neg 06/15/2020 1333   KETONESUR NEGATIVE 12/14/2020 1730   PROTEINUR  30 (A) 12/14/2020 1730   UROBILINOGEN 0.2 11/30/2020 1218   NITRITE NEGATIVE 12/14/2020 1730   LEUKOCYTESUR MODERATE (A) 12/14/2020 1730   Sepsis Labs Invalid input(s): PROCALCITONIN,  WBC,  LACTICIDVEN Microbiology Recent Results (from the past 240 hour(s))  Resp Panel by RT-PCR (Flu A&B, Covid)     Status: None   Collection Time: 12/08/20  9:17 AM  Result Value Ref Range Status   SARS Coronavirus 2 by RT PCR NEGATIVE NEGATIVE Final    Comment: (NOTE) SARS-CoV-2 target nucleic acids are NOT DETECTED.  The SARS-CoV-2 RNA is generally detectable in upper respiratory specimens during the acute phase of infection. The lowest concentration of SARS-CoV-2 viral copies this assay can detect is 138 copies/mL. A negative result does not preclude SARS-Cov-2 infection and should not be used as the sole basis for treatment or other patient management decisions. A negative result may occur with  improper specimen collection/handling, submission of specimen other than nasopharyngeal swab, presence of viral mutation(s) within the areas targeted by this assay, and inadequate number of viral copies(<138 copies/mL). A negative result must be combined with clinical observations, patient history, and epidemiological information. The expected result is Negative.  Fact Sheet for Patients:  EntrepreneurPulse.com.au  Fact Sheet for Healthcare Providers:  IncredibleEmployment.be  This test is no t yet approved or cleared by the [place] FDA and  has been authorized for detection and/or diagnosis of SARS-CoV-2 by FDA under an Emergency Use Authorization (EUA). This EUA will remain  in effect (meaning this test can be used) for the duration of the COVID-19 declaration under Section 564(b)(1) of the Act, 21 U.S.C.section 360bbb-3(b)(1), unless the authorization is terminated  or revoked sooner.       Influenza A by PCR NEGATIVE NEGATIVE Final   Influenza B by  PCR NEGATIVE NEGATIVE Final    Comment: (NOTE) The Xpert Xpress SARS-CoV-2/FLU/RSV plus assay is intended as an aid in the diagnosis of influenza from Nasopharyngeal swab specimens and should not be used as a sole basis for treatment. Nasal washings and aspirates are unacceptable for Xpert Xpress SARS-CoV-2/FLU/RSV testing.  Fact Sheet for Patients: EntrepreneurPulse.com.au  Fact Sheet for Healthcare Providers: IncredibleEmployment.be  This test is not yet approved or cleared by the [place] FDA and has been authorized for detection and/or diagnosis of SARS-CoV-2 by FDA under an Emergency Use Authorization (EUA). This EUA will remain in effect (meaning this test can be used) for the duration of the COVID-19 declaration under Section 564(b)(1) of the Act, 21 U.S.C. section 360bbb-3(b)(1), unless the authorization is terminated or revoked.  Performed at [facility], [place] [address], [place], [place] [address]      SIGNED:   Charlynne Cousins, MD  [facility] 12/16/2020, 10:11 AM Pager   If 7PM-7AM, please contact night-coverage [email or web address] Password [email or web address]

## 2020-12-16 NOTE — Telephone Encounter (Signed)
Ariel [name] [phone]   Pt has new brain tumor, requesting new orders for nurse eval.

## 2020-12-16 NOTE — Progress Notes (Signed)
Ms. Zou looks great this morning.  She feels good.  She had an upper endoscopy of the yesterday of the day before.  This was done with endoscopic ultrasound.  Biopsies were taken.  We will have to wait the results.  I have to believe that we are looking at melanoma.  If we do find that this is melanoma, the real key will be whether or not there is a BRAF mutation.  Hopefully there will be a BRAF mutation so we can use targeted therapy.  She does feel tired.  She does feel weak.  She has iron deficiency.  I think she has received a dose of IV iron already.  There is no loss of back area today.  Yesterday, her white cell count was 9.4.  Hemoglobin 10.3.  Platelet count 384,000.  She is on steroids for the brain mets.  This is probably helping her appetite.  Her vital signs are all stable.  Blood pressure 114/50.  Temperature 98.8.  Her lungs are clear.  Cardiac exam regular rate and rhythm.  Abdomen is soft.  She has good bowel sounds.  Neurological exam is intact.  There is really no confusion.  There is no disorientation.  Again, I had believe Ms. Odden has metastatic melanoma.  The only concern is that given her initial melanoma being stage I, I would have thought the chance of recurrence would be less than 10%.  Hopefully there will be some results back today.  I do not think the BRAF analysis, if this is melanoma, will be back for another for 5 days at the earliest.  Hopefully, she will be able to go home soon.  I think that she has a daughter coming up from [place] to help her out.  She is very appreciative of the great care that she has gotten from the staff on [address].  I know that the staff does a wonderful job and are so compassionate.  [name], MD  Oswaldo Milian 12:2

## 2020-12-16 NOTE — Plan of Care (Signed)

## 2020-12-16 NOTE — Progress Notes (Signed)
Patient is ready for discharge home with assistance of family at bedside. Discharge instructions reviewed with patient and family.

## 2020-12-16 NOTE — Anesthesia Postprocedure Evaluation (Signed)
Anesthesia Post Note  Patient: Leshay Desaulniers  Procedure(s) Performed: ESOPHAGOGASTRODUODENOSCOPY (EGD) WITH PROPOFOL UPPER ESOPHAGEAL ENDOSCOPIC ULTRASOUND (EUS) BIOPSY FINE NEEDLE ASPIRATION (FNA) LINEAR     Patient location during evaluation: PACU Anesthesia Type: MAC Level of consciousness: awake and alert Pain management: pain level controlled Vital Signs Assessment: post-procedure vital signs reviewed and stable Respiratory status: spontaneous breathing, nonlabored ventilation, respiratory function stable and patient connected to nasal cannula oxygen Cardiovascular status: stable and blood pressure returned to baseline Postop Assessment: no apparent nausea or vomiting Anesthetic complications: no   No notable events documented.  Last Vitals:  Vitals:   12/16/20 0838 12/16/20 1220  BP: 121/75 131/60  Pulse: 67 77  Resp: 16   Temp: 36.6 C   SpO2: 100% 100%    Last Pain:  Vitals:   12/16/20 1220  TempSrc:   PainSc: 0-No pain                 Rosabell Geyer

## 2020-12-17 LAB — URINE CULTURE: Culture: >=100000 — AB

## 2020-12-17 NOTE — Telephone Encounter (Signed)
Verbal orders given to [facility] for nursing evaluation for new diagnosis of brain tumor.   Lvm for patient to call us back she will need to see Melissa for an in office visit for a full evaluation under her new diagnosis to cover all orders that we will receive from [facility].

## 2020-12-17 NOTE — Telephone Encounter (Signed)
OK to give verbal order. I will need to see her in the office for a follow up visit please though to formally meet the medicare criteria for in person visit.

## 2020-12-18 DIAGNOSIS — M47812 Spondylosis without myelopathy or radiculopathy, cervical region: Secondary | ICD-10-CM | POA: Diagnosis not present

## 2020-12-18 DIAGNOSIS — R590 Localized enlarged lymph nodes: Secondary | ICD-10-CM | POA: Diagnosis not present

## 2020-12-18 DIAGNOSIS — E278 Other specified disorders of adrenal gland: Secondary | ICD-10-CM | POA: Diagnosis not present

## 2020-12-18 DIAGNOSIS — Z9181 History of falling: Secondary | ICD-10-CM | POA: Diagnosis not present

## 2020-12-18 DIAGNOSIS — I6523 Occlusion and stenosis of bilateral carotid arteries: Secondary | ICD-10-CM | POA: Diagnosis not present

## 2020-12-18 DIAGNOSIS — E7800 Pure hypercholesterolemia, unspecified: Secondary | ICD-10-CM | POA: Diagnosis not present

## 2020-12-18 DIAGNOSIS — D630 Anemia in neoplastic disease: Secondary | ICD-10-CM | POA: Diagnosis not present

## 2020-12-18 DIAGNOSIS — R4701 Aphasia: Secondary | ICD-10-CM | POA: Diagnosis not present

## 2020-12-18 DIAGNOSIS — Z8673 Personal history of transient ischemic attack (TIA), and cerebral infarction without residual deficits: Secondary | ICD-10-CM | POA: Diagnosis not present

## 2020-12-18 DIAGNOSIS — G9341 Metabolic encephalopathy: Secondary | ICD-10-CM | POA: Diagnosis not present

## 2020-12-18 DIAGNOSIS — E871 Hypo-osmolality and hyponatremia: Secondary | ICD-10-CM | POA: Diagnosis not present

## 2020-12-18 DIAGNOSIS — Z85820 Personal history of malignant melanoma of skin: Secondary | ICD-10-CM | POA: Diagnosis not present

## 2020-12-18 DIAGNOSIS — C7931 Secondary malignant neoplasm of brain: Secondary | ICD-10-CM | POA: Diagnosis not present

## 2020-12-18 DIAGNOSIS — Z7952 Long term (current) use of systemic steroids: Secondary | ICD-10-CM | POA: Diagnosis not present

## 2020-12-18 DIAGNOSIS — D509 Iron deficiency anemia, unspecified: Secondary | ICD-10-CM | POA: Diagnosis not present

## 2020-12-18 DIAGNOSIS — I10 Essential (primary) hypertension: Secondary | ICD-10-CM | POA: Diagnosis not present

## 2020-12-20 DIAGNOSIS — R4701 Aphasia: Secondary | ICD-10-CM | POA: Diagnosis not present

## 2020-12-20 DIAGNOSIS — Z9181 History of falling: Secondary | ICD-10-CM | POA: Diagnosis not present

## 2020-12-20 DIAGNOSIS — G9341 Metabolic encephalopathy: Secondary | ICD-10-CM | POA: Diagnosis not present

## 2020-12-20 DIAGNOSIS — Z8673 Personal history of transient ischemic attack (TIA), and cerebral infarction without residual deficits: Secondary | ICD-10-CM | POA: Diagnosis not present

## 2020-12-20 DIAGNOSIS — Z7952 Long term (current) use of systemic steroids: Secondary | ICD-10-CM | POA: Diagnosis not present

## 2020-12-20 DIAGNOSIS — D509 Iron deficiency anemia, unspecified: Secondary | ICD-10-CM | POA: Diagnosis not present

## 2020-12-20 DIAGNOSIS — E278 Other specified disorders of adrenal gland: Secondary | ICD-10-CM | POA: Diagnosis not present

## 2020-12-20 DIAGNOSIS — E871 Hypo-osmolality and hyponatremia: Secondary | ICD-10-CM | POA: Diagnosis not present

## 2020-12-20 DIAGNOSIS — C7931 Secondary malignant neoplasm of brain: Secondary | ICD-10-CM | POA: Diagnosis not present

## 2020-12-20 DIAGNOSIS — I10 Essential (primary) hypertension: Secondary | ICD-10-CM | POA: Diagnosis not present

## 2020-12-20 DIAGNOSIS — E7800 Pure hypercholesterolemia, unspecified: Secondary | ICD-10-CM | POA: Diagnosis not present

## 2020-12-20 DIAGNOSIS — R590 Localized enlarged lymph nodes: Secondary | ICD-10-CM | POA: Diagnosis not present

## 2020-12-20 DIAGNOSIS — D630 Anemia in neoplastic disease: Secondary | ICD-10-CM | POA: Diagnosis not present

## 2020-12-20 DIAGNOSIS — M47812 Spondylosis without myelopathy or radiculopathy, cervical region: Secondary | ICD-10-CM | POA: Diagnosis not present

## 2020-12-20 DIAGNOSIS — Z85820 Personal history of malignant melanoma of skin: Secondary | ICD-10-CM | POA: Diagnosis not present

## 2020-12-20 DIAGNOSIS — I6523 Occlusion and stenosis of bilateral carotid arteries: Secondary | ICD-10-CM | POA: Diagnosis not present

## 2020-12-20 NOTE — Telephone Encounter (Signed)
Patient was scheduled to come in 12-22-20

## 2020-12-21 ENCOUNTER — Telehealth: Payer: Self-pay | Admitting: Neurology

## 2020-12-21 ENCOUNTER — Other Ambulatory Visit: Payer: Self-pay | Admitting: Radiation Therapy

## 2020-12-21 ENCOUNTER — Other Ambulatory Visit: Payer: Self-pay | Admitting: Urology

## 2020-12-21 DIAGNOSIS — R590 Localized enlarged lymph nodes: Secondary | ICD-10-CM

## 2020-12-21 DIAGNOSIS — H353121 Nonexudative age-related macular degeneration, left eye, early dry stage: Secondary | ICD-10-CM | POA: Diagnosis not present

## 2020-12-21 DIAGNOSIS — H43392 Other vitreous opacities, left eye: Secondary | ICD-10-CM | POA: Diagnosis not present

## 2020-12-21 DIAGNOSIS — C7931 Secondary malignant neoplasm of brain: Secondary | ICD-10-CM

## 2020-12-21 DIAGNOSIS — H35362 Drusen (degenerative) of macula, left eye: Secondary | ICD-10-CM | POA: Diagnosis not present

## 2020-12-21 DIAGNOSIS — H35452 Secondary pigmentary degeneration, left eye: Secondary | ICD-10-CM | POA: Diagnosis not present

## 2020-12-21 LAB — CYTOLOGY - NON PAP

## 2020-12-21 NOTE — Telephone Encounter (Signed)
Called pt and spoke with her.  She told me/updated me with her medical history and felt nothing but gratefulness for the medical community.  Asked her how I could help and she said nothing right now.  She felt she was in good hands and had f/u with Dr. Marin Olp.  Told her to stop in when she could and she said that she would.

## 2020-12-21 NOTE — Telephone Encounter (Signed)
Patient would like Tat to check her mychart regarding her new health concerns. Since pt saw Tat in august, she has been In the hospital; her kids would like tat to know whats going on. Pt said tat can call her kids if she needs to talk with them about anything.

## 2020-12-22 ENCOUNTER — Encounter: Payer: Self-pay | Admitting: Hematology & Oncology

## 2020-12-22 ENCOUNTER — Other Ambulatory Visit: Payer: Self-pay | Admitting: Hematology & Oncology

## 2020-12-22 ENCOUNTER — Ambulatory Visit (INDEPENDENT_AMBULATORY_CARE_PROVIDER_SITE_OTHER): Payer: Medicare Other | Admitting: Family

## 2020-12-22 ENCOUNTER — Encounter: Payer: Self-pay | Admitting: *Deleted

## 2020-12-22 ENCOUNTER — Ambulatory Visit: Payer: Medicare Other

## 2020-12-22 ENCOUNTER — Other Ambulatory Visit: Payer: Self-pay

## 2020-12-22 VITALS — BP 134/57 | HR 72 | Temp 98.6°F | Resp 16 | Wt 142.0 lb

## 2020-12-22 DIAGNOSIS — E871 Hypo-osmolality and hyponatremia: Secondary | ICD-10-CM

## 2020-12-22 DIAGNOSIS — D649 Anemia, unspecified: Secondary | ICD-10-CM | POA: Diagnosis not present

## 2020-12-22 DIAGNOSIS — Z7189 Other specified counseling: Secondary | ICD-10-CM

## 2020-12-22 DIAGNOSIS — C8590 Non-Hodgkin lymphoma, unspecified, unspecified site: Secondary | ICD-10-CM

## 2020-12-22 DIAGNOSIS — D500 Iron deficiency anemia secondary to blood loss (chronic): Secondary | ICD-10-CM

## 2020-12-22 DIAGNOSIS — C7931 Secondary malignant neoplasm of brain: Secondary | ICD-10-CM | POA: Diagnosis not present

## 2020-12-22 DIAGNOSIS — R4701 Aphasia: Secondary | ICD-10-CM

## 2020-12-22 DIAGNOSIS — C8510 Unspecified B-cell lymphoma, unspecified site: Secondary | ICD-10-CM

## 2020-12-22 HISTORY — DX: Other specified counseling: Z71.89

## 2020-12-22 HISTORY — DX: Unspecified B-cell lymphoma, unspecified site: C85.10

## 2020-12-22 LAB — CBC WITH DIFFERENTIAL/PLATELET
Basophils Absolute: 0 10*3/uL (ref 0.0–0.1)
Basophils Relative: 0.1 % (ref 0.0–3.0)
Eosinophils Absolute: 0 10*3/uL (ref 0.0–0.7)
Eosinophils Relative: 0.2 % (ref 0.0–5.0)
HCT: 33.6 % — ABNORMAL LOW (ref 36.0–46.0)
Hemoglobin: 10.7 g/dL — ABNORMAL LOW (ref 12.0–15.0)
Lymphocytes Relative: 12.8 % (ref 12.0–46.0)
Lymphs Abs: 1.5 10*3/uL (ref 0.7–4.0)
MCHC: 31.8 g/dL (ref 30.0–36.0)
MCV: 76.1 fl — ABNORMAL LOW (ref 78.0–100.0)
Monocytes Absolute: 1.2 10*3/uL — ABNORMAL HIGH (ref 0.1–1.0)
Monocytes Relative: 10.4 % (ref 3.0–12.0)
Neutro Abs: 9 10*3/uL — ABNORMAL HIGH (ref 1.4–7.7)
Neutrophils Relative %: 76.5 % (ref 43.0–77.0)
Platelets: 417 10*3/uL — ABNORMAL HIGH (ref 150.0–400.0)
RBC: 4.42 Mil/uL (ref 3.87–5.11)
RDW: 19.6 % — ABNORMAL HIGH (ref 11.5–15.5)
WBC: 11.8 10*3/uL — ABNORMAL HIGH (ref 4.0–10.5)

## 2020-12-22 LAB — COMPREHENSIVE METABOLIC PANEL
ALT: 53 U/L — ABNORMAL HIGH (ref 0–35)
AST: 21 U/L (ref 0–37)
Albumin: 3.1 g/dL — ABNORMAL LOW (ref 3.5–5.2)
Alkaline Phosphatase: 106 U/L (ref 39–117)
BUN: 14 mg/dL (ref 6–23)
CO2: 34 mEq/L — ABNORMAL HIGH (ref 19–32)
Calcium: 9.5 mg/dL (ref 8.4–10.5)
Chloride: 92 mEq/L — ABNORMAL LOW (ref 96–112)
Creatinine, Ser: 0.43 mg/dL (ref 0.40–1.20)
GFR: 101.16 mL/min (ref >60.00)
Glucose, Bld: 86 mg/dL (ref 70–99)
Potassium: 4.1 mEq/L (ref 3.5–5.1)
Sodium: 133 mEq/L — ABNORMAL LOW (ref 135–145)
Total Bilirubin: 0.3 mg/dL (ref 0.2–1.2)
Total Protein: 7 g/dL (ref 6.0–8.3)

## 2020-12-22 LAB — B12 AND FOLATE PANEL
Folate: 10.2 ng/mL (ref >5.9)
Vitamin B-12: 616 pg/mL (ref 211–911)

## 2020-12-22 NOTE — Progress Notes (Addendum)
Subjective:   By signing my name below, I, Shehryar Baig, attest that this documentation has been prepared under the direction and in the presence of [name] NP. 12/22/2020    Patient ID: Ariel [name], female    DOB: 1955-11-04, 65 y.o.   MRN: [id number]  Chief Complaint  Patient presents with   Ariel [name] metastasis    Here for follow up on new diagnosis.     HPI Patient is in today for a office visit. Her son Mali is present with her during this visit. Patient is a 65 yr old female with history of melanoma of the right hip which was resected on 10/17/2019, who on October 11 underwent a vitrectomy subsequently had a subconjunctival hemorrhage came into the ED for acute onset of confusion, speech deficit and inability to follow commands on 12/08/20. This is following what was thought to be a TIA about 8 weeks ago. She underwent MRI of the brain which noted  left cerebral hemisphere lesion in the posterior temporal lobe with edema. She was subsequently started on Keppra and decadron. During this admission neurosurgery and Radiation Oncology were consulted. CT of the chest/abd/pelvis showed diffuse lymphadenopathy. She underwent EUS of lymph node by GI during this visit. Patient and son have already reviewed the results in [place] which revealed High Grad B cell Lymphoma. This was an unexpected finding.   She continues to see a oncologist, Dr. Marin Olp to manage her cancer diagnosis and is scheduled to see him in early November.  Since discharge from the hospital, she reports doing well at home. Her speech has improved since her last hospital visit. She continues taking 4 mg decadron 2x daily PO, 500 mg keppra 2x daily PO and reports no new issues while taking it.  Her son reports every night she has night sweats and hot feet.  She also complains of numbness and tingling in her feet during the day.  Her son reports she is struggling to calculate numbers and scheduling. She is also having  memory issues.  Mobility- She reports her mobility is doing well following her hospital stay but she is requiring a cane or a walker. She reports her her right hand is slightly weak.  FMLA-Her son is requesting for FMLA forms to be filled so he can care for his mother.  She is interested in getting a flu vaccine at a later date.   She was also diagnosed with UTI while in the hospital and completed a course of Keflex.   Health Maintenance Due  Topic Date Due   COLONOSCOPY (Pts 45-45yr Insurance coverage will need to be confirmed)  03/02/2018   COVID-19 Vaccine (2 - [facility] risk series) 06/27/2019   INFLUENZA VACCINE  09/27/2020    Past Medical History:  Diagnosis Date   Chicken pox    Complication of anesthesia    Per patient, very slow to wake up after anesthesia   Goals of care, counseling/discussion 12/22/2020   High cholesterol    High grade B-cell lymphoma ([facility] 12/22/2020   Hyperglycemia 11/10/2020   Melanoma ([facility] 2021   right hip   PONV (postoperative nausea and vomiting)    Urinary incontinence     Past Surgical History:  Procedure Laterality Date   AIR/FLUID EXCHANGE Right 12/07/2020   Procedure: AIR/FLUID EXCHANGE;  Surgeon: PJalene Mullet MD;  [place] [facility]  Service: Ophthalmology;  Laterality: Right;   APPENDECTOMY  1962   BIOPSY  12/14/2020   Procedure: BIOPSY;  Surgeon: MIrving Copas,  MD;  Location: [place];  Service: Gastroenterology;;   ESOPHAGOGASTRODUODENOSCOPY (EGD) WITH PROPOFOL N/A 12/14/2020   Procedure: ESOPHAGOGASTRODUODENOSCOPY (EGD) WITH PROPOFOL;  Surgeon: Rush Landmark Telford Nab., MD;  Location: [place];  Service: Gastroenterology;  Laterality: N/A;   EXCISION MELANOMA WITH SENTINEL LYMPH NODE BIOPSY Right 10/09/2019   Procedure: WIDE LOCAL EXCISION WITH ADVANCEMENT FLAP CLOSURE RIGHT HIP MELANOMA WITH SENTINEL LYMPH NODE BIOPSY;  Surgeon: Stark Klein, MD;  Location: [place];  Service: General;  Laterality: Right;   FINE  NEEDLE ASPIRATION  12/14/2020   Procedure: FINE NEEDLE ASPIRATION (FNA) LINEAR;  Surgeon: Irving Copas., MD;  Location: [place];  Service: Gastroenterology;;   HERNIA REPAIR  2009   MOUTH SURGERY  11/2015   gum surgery and bone implant   PARS PLANA VITRECTOMY Right 12/07/2020   Procedure: PARS PLANA VITRECTOMY WITH 25 GAUGE;  Surgeon: Jalene Mullet, MD;  Location: [place];  Service: Ophthalmology;  Laterality: Right;   PHOTOCOAGULATION WITH LASER Right 12/07/2020   Procedure: PHOTOCOAGULATION WITH ENDOLASER PANRITINAL COAGULATION;  Surgeon: Jalene Mullet, MD;  Location: [place];  Service: Ophthalmology;  Laterality: Right;   UPPER ESOPHAGEAL ENDOSCOPIC ULTRASOUND (EUS) N/A 12/14/2020   Procedure: UPPER ESOPHAGEAL ENDOSCOPIC ULTRASOUND (EUS);  Surgeon: Irving Copas., MD;  Location: [place];  Service: Gastroenterology;  Laterality: N/A;   WISDOM TOOTH EXTRACTION      Family History  Problem Relation Age of Onset   Coronary artery disease Father        died at 65   COPD Father    Alcohol abuse Father    Diabetes Mellitus II Father    Coronary artery disease Sister    Cancer Sister        breast   Heart attack Brother    Hyperlipidemia Other        died 64- "natural causes"   Hyperlipidemia Other        4 siblings    Hypertension Other        3 siblings    Social History   Socioeconomic History   Marital status: Widowed    Spouse name: Not on file   Number of children: Not on file   Years of education: Not on file   Highest education level: Not on file  Occupational History   Occupation: retired  Tobacco Use   Smoking status: Never   Smokeless tobacco: Never  Vaping Use   Vaping Use: Never used  Substance and Sexual Activity   Alcohol use: Never   Drug use: Never   Sexual activity: Not Currently  Other Topics Concern   Not on file  Social History Narrative   Lives with husband   One Son- lives locally, 1 grand-daughter   Daughter- Sport and exercise psychologist.  [place]   Completed Hotel manager school   Works as a Probation officer.         Social Determinants of Radio broadcast assistant Strain: Low Risk    Difficulty of Paying Living Expenses: Not hard at all  Food Insecurity: No Food Insecurity   Worried About Charity fundraiser in the Last Year: Never true   Arboriculturist in the Last Year: Never true  Transportation Needs: No Transportation Needs   Lack of Transportation (Medical): No   Lack of Transportation (Non-Medical): No  Physical Activity: Sufficiently Active   Days of Exercise per Week: 4 days   Minutes of Exercise per Session: 40 min  Stress: No Stress Concern Present   Feeling  of Stress : Not at all  Social Connections: Moderately Isolated   Frequency of Communication with Friends and Family: More than three times a week   Frequency of Social Gatherings with Friends and Family: More than three times a week   Attends Religious Services: More than 4 times per year   Active Member of [employer] or Organizations: No   Attends Archivist Meetings: Never   Marital Status: Widowed  Human resources officer Violence: Not At Risk   Fear of Current or Ex-Partner: No   Emotionally Abused: No   Physically Abused: No   Sexually Abused: No    Outpatient Medications Prior to Visit  Medication Sig Dispense Refill   dexamethasone (DECADRON) 4 MG tablet Take 1 tablet (4 mg total) by mouth 2 (two) times daily. 60 tablet 1   levETIRAcetam (KEPPRA) 500 MG tablet Take 1 tablet (500 mg total) by mouth 2 (two) times daily. 60 tablet 2   aspirin EC 81 MG tablet Take 81 mg by mouth daily. Swallow whole.     betamethasone dipropionate 0.05 % cream Apply topically 2 (two) times daily. (Patient not taking: No sig reported) 30 g 1   No facility-administered medications prior to visit.    Allergies  Allergen Reactions   Codeine Nausea Only   Plavix [Clopidogrel]     Hives,lip swelling     Review of Systems  Constitutional:  Positive for  diaphoresis (night sweats).       (+)hot feet at night  Neurological:  Positive for tingling (in feet during day).  Psychiatric/Behavioral:  Positive for memory loss.        (+)struggling to calculate numbers (+)struggling to remember daily tasks      Objective:    Physical Exam Constitutional:      General: She is not in acute distress.    Appearance: Normal appearance. She is not ill-appearing.  HENT:     Head: Normocephalic and atraumatic.     Right Ear: External ear normal.     Left Ear: External ear normal.  Eyes:     Comments: Right pupil enlarged and minimally reactive to light Left pupil is normal and reactive to light No nystagmus  Cardiovascular:     Rate and Rhythm: Normal rate and regular rhythm.     Heart sounds: Normal heart sounds. No murmur heard.   No gallop.  Pulmonary:     Effort: Pulmonary effort is normal. No respiratory distress.     Breath sounds: Normal breath sounds. No wheezing or rales.  Musculoskeletal:     Comments: 4/5 strength in right upper extremity 5/5 strength in left upper extremity and both lower extremities  Skin:    General: Skin is warm and dry.  Neurological:     Mental Status: She is alert and oriented to person, place, and time.     Comments: Speech is clear   Psychiatric:        Behavior: Behavior normal.        Judgment: Judgment normal.    BP (!) 134/57 (BP Location: Right Arm, Patient Position: Sitting, Cuff Size: Small)   Pulse 72   Temp 98.6 F (37 C) (Oral)   Resp 16   Wt 142 lb (64.4 kg)   SpO2 100%   BMI 22.92 kg/m  Wt Readings from Last 3 Encounters:  12/22/20 142 lb (64.4 kg)  12/14/20 142 lb 13.7 oz (64.8 kg)  12/07/20 142 lb (64.4 kg)       Assessment &  Plan:   Problem List Items Addressed This Visit       Unprioritized   High grade B-cell lymphoma ([place]) (Chronic)    This is a new and unexpected finding.  Will make sure that her oncology team is aware. She has an upcoming appointment scheduled  with oncology. This certainly could be an explanation for her anemia and night sweats that she has been experiencing recently. FMLA form was completed and given to her son who will be her primary caregiver. She states that she has HCPOA paperwork completed and in place.       Iron deficiency anemia   Relevant Orders   CBC with Differential/Platelet   Hyponatremia   Relevant Orders   Comp Met (CMET)   Brain metastasis ([place])    Presumed due to melanoma but neurosurgery did not feel that biopsy was a safe option. Defer management to oncology team. She is maintained on Keppra for seizure prophylaxis and thankfully has not experienced any seizures.        Aphasia    Improved following treatment with decadron. Monitor.       Anemia - Primary   Relevant Orders   CBC with Differential/Platelet   B12 and Folate Panel   50 minutes spent on today's visit. The majority of the time was spent counseling the patient and her son as well as reviewing her medical records.   No orders of the defined types were placed in this encounter.   I, [name] NP, personally preformed the services described in this documentation.  All medical record entries made by the scribe were at my direction and in my presence.  I have reviewed the chart and discharge instructions (if applicable) and agree that the record reflects my personal performance and is accurate and complete. 12/22/2020   I,Shehryar Baig,acting as a Education administrator for Nance Pear, NP.,have documented all relevant documentation on the behalf of Nance Pear, NP,as directed by  Nance Pear, NP while in the presence of Nance Pear, NP.   Nance Pear, NP

## 2020-12-22 NOTE — Assessment & Plan Note (Signed)
Improved following treatment with decadron. Monitor.

## 2020-12-22 NOTE — Progress Notes (Signed)
Received following message regarding pending path:  Ariel [name]:  NHL is a total shock!!!!  She needs a PET scan.  She will need a port placed.  I have to believe the lesions in the brain are also lymphoma.  I must see her on Tuesday for 45 minutes.  This is much more complex than I thought!!   Please call her with the dx.  Pete   Radiation Oncology had already ordered a PET which is scheduled for 01/03/2021. Order for port placement will be placed.   Called and spoke to the patient and her son, Ariel [name]. Reviewed path results and port placement. Explained that IR will be reaching out to schedule. Also gave them appointment details to follow up with Dr Marin Olp on 12/28/2020.   Oncology Nurse Navigator Documentation  Oncology Nurse Navigator Flowsheets 12/22/2020  Abnormal Finding Date 12/08/2020  Confirmed Diagnosis Date 12/14/2020  Navigator Follow Up Date: 12/28/2020  Navigator Follow Up Reason: Follow-up After Biopsy  Navigator Location [facility]  Navigator Encounter Type Introductory Phone Call;Diagnostic Results  Patient Visit Type MedOnc  Treatment Phase Pre-Tx/Tx Discussion  Barriers/Navigation Needs Coordination of Care;Education  Education Other  Interventions Coordination of Care;Education  Acuity Level 2-Minimal Needs (1-2 Barriers Identified)  Coordination of Care Appts  Education Method Verbal;Teach-back  Support Groups/Services Friends and Family  Time Spent with Patient 9

## 2020-12-22 NOTE — Assessment & Plan Note (Addendum)
Presumed due to melanoma but neurosurgery did not feel that biopsy was a safe option. Defer management to oncology team. She is maintained on Keppra for seizure prophylaxis and thankfully has not experienced any seizures.

## 2020-12-22 NOTE — Assessment & Plan Note (Addendum)
This is a new and unexpected finding.  Will make sure that her oncology team is aware. She has an upcoming appointment scheduled with oncology. This certainly could be an explanation for her anemia and night sweats that she has been experiencing recently. FMLA form was completed and given to her son who will be her primary caregiver. She states that she has HCPOA paperwork completed and in place.

## 2020-12-23 DIAGNOSIS — C7931 Secondary malignant neoplasm of brain: Secondary | ICD-10-CM | POA: Diagnosis not present

## 2020-12-23 DIAGNOSIS — R590 Localized enlarged lymph nodes: Secondary | ICD-10-CM | POA: Diagnosis not present

## 2020-12-23 DIAGNOSIS — D509 Iron deficiency anemia, unspecified: Secondary | ICD-10-CM | POA: Diagnosis not present

## 2020-12-23 DIAGNOSIS — Z8673 Personal history of transient ischemic attack (TIA), and cerebral infarction without residual deficits: Secondary | ICD-10-CM | POA: Diagnosis not present

## 2020-12-23 DIAGNOSIS — Z7952 Long term (current) use of systemic steroids: Secondary | ICD-10-CM | POA: Diagnosis not present

## 2020-12-23 DIAGNOSIS — I6523 Occlusion and stenosis of bilateral carotid arteries: Secondary | ICD-10-CM | POA: Diagnosis not present

## 2020-12-23 DIAGNOSIS — D630 Anemia in neoplastic disease: Secondary | ICD-10-CM | POA: Diagnosis not present

## 2020-12-23 DIAGNOSIS — Z9181 History of falling: Secondary | ICD-10-CM | POA: Diagnosis not present

## 2020-12-23 DIAGNOSIS — E7800 Pure hypercholesterolemia, unspecified: Secondary | ICD-10-CM | POA: Diagnosis not present

## 2020-12-23 DIAGNOSIS — R4701 Aphasia: Secondary | ICD-10-CM | POA: Diagnosis not present

## 2020-12-23 DIAGNOSIS — Z85820 Personal history of malignant melanoma of skin: Secondary | ICD-10-CM | POA: Diagnosis not present

## 2020-12-23 DIAGNOSIS — M47812 Spondylosis without myelopathy or radiculopathy, cervical region: Secondary | ICD-10-CM | POA: Diagnosis not present

## 2020-12-23 DIAGNOSIS — E278 Other specified disorders of adrenal gland: Secondary | ICD-10-CM | POA: Diagnosis not present

## 2020-12-23 DIAGNOSIS — I10 Essential (primary) hypertension: Secondary | ICD-10-CM | POA: Diagnosis not present

## 2020-12-23 DIAGNOSIS — G9341 Metabolic encephalopathy: Secondary | ICD-10-CM | POA: Diagnosis not present

## 2020-12-23 DIAGNOSIS — E871 Hypo-osmolality and hyponatremia: Secondary | ICD-10-CM | POA: Diagnosis not present

## 2020-12-24 ENCOUNTER — Telehealth: Payer: Self-pay

## 2020-12-24 DIAGNOSIS — E278 Other specified disorders of adrenal gland: Secondary | ICD-10-CM | POA: Diagnosis not present

## 2020-12-24 DIAGNOSIS — I6523 Occlusion and stenosis of bilateral carotid arteries: Secondary | ICD-10-CM | POA: Diagnosis not present

## 2020-12-24 DIAGNOSIS — E7800 Pure hypercholesterolemia, unspecified: Secondary | ICD-10-CM | POA: Diagnosis not present

## 2020-12-24 DIAGNOSIS — D509 Iron deficiency anemia, unspecified: Secondary | ICD-10-CM | POA: Diagnosis not present

## 2020-12-24 DIAGNOSIS — E871 Hypo-osmolality and hyponatremia: Secondary | ICD-10-CM | POA: Diagnosis not present

## 2020-12-24 DIAGNOSIS — Z7952 Long term (current) use of systemic steroids: Secondary | ICD-10-CM | POA: Diagnosis not present

## 2020-12-24 DIAGNOSIS — M47812 Spondylosis without myelopathy or radiculopathy, cervical region: Secondary | ICD-10-CM | POA: Diagnosis not present

## 2020-12-24 DIAGNOSIS — Z85820 Personal history of malignant melanoma of skin: Secondary | ICD-10-CM | POA: Diagnosis not present

## 2020-12-24 DIAGNOSIS — C7931 Secondary malignant neoplasm of brain: Secondary | ICD-10-CM | POA: Diagnosis not present

## 2020-12-24 DIAGNOSIS — Z9181 History of falling: Secondary | ICD-10-CM | POA: Diagnosis not present

## 2020-12-24 DIAGNOSIS — D630 Anemia in neoplastic disease: Secondary | ICD-10-CM | POA: Diagnosis not present

## 2020-12-24 DIAGNOSIS — R590 Localized enlarged lymph nodes: Secondary | ICD-10-CM | POA: Diagnosis not present

## 2020-12-24 DIAGNOSIS — Z8673 Personal history of transient ischemic attack (TIA), and cerebral infarction without residual deficits: Secondary | ICD-10-CM | POA: Diagnosis not present

## 2020-12-24 DIAGNOSIS — R4701 Aphasia: Secondary | ICD-10-CM | POA: Diagnosis not present

## 2020-12-24 DIAGNOSIS — I10 Essential (primary) hypertension: Secondary | ICD-10-CM | POA: Diagnosis not present

## 2020-12-24 DIAGNOSIS — G9341 Metabolic encephalopathy: Secondary | ICD-10-CM | POA: Diagnosis not present

## 2020-12-24 NOTE — Telephone Encounter (Signed)
Transition Care Management Follow-up Telephone Call Permission from patient to speak with daughter, Magda Paganini Date of discharge and from where: 12/16/20 from [facility] How have you been since you were released from the hospital? "Doing well" Any questions or concerns? No  Items Reviewed: Did the pt receive and understand the discharge instructions provided? Yes  Medications obtained and verified? Yes  Other? No  Any new allergies since your discharge? No  Dietary orders reviewed? Yes  Do you have support at home? Yes   Home Care and Equipment/Supplies: Were home health services ordered? yes If so, what is the name of the agency? [facility]  Has the agency set up a time to come to the patient's home? yes Were any new equipment or medical supplies ordered?  Yes: Walker and Bedside commode received in the hospital What is the name of the medical supply agency? Not applicable Were you able to get the supplies/equipment? not applicable Do you have any questions related to the use of the equipment or supplies? No  Functional Questionnaire: (I = Independent and D = Dependent) ADLs: standby assist  Bathing/Dressing- standby assist  Meal Prep- D  Eating- I  Maintaining continence- I  Transferring/Ambulation- I with walker, standby assistance  Managing Meds- I with assistance  Follow up appointments reviewed:  PCP Hospital f/u appt confirmed? Yes  Saw on 12/22/20  Specialist Hospital f/u appt confirmed? Yes  Scheduled to see Dr. Martha Clan on 12/28/20 @ 1:00pm. Saw Dr. Pearline Cables doctor) on Tuesday. Are transportation arrangements needed? No  If their condition worsens, is the pt aware to call PCP or go to the Emergency Dept.? Yes Was the patient provided with contact information for the PCP's office or ED? Yes Was to pt encouraged to call back with questions or concerns? Yes    Thea Silversmith, RN, MSN, BSN, [facility] Care Management Coordinator [phone]

## 2020-12-27 ENCOUNTER — Other Ambulatory Visit: Payer: Self-pay

## 2020-12-27 ENCOUNTER — Ambulatory Visit (HOSPITAL_COMMUNITY)
Admission: RE | Admit: 2020-12-27 | Discharge: 2020-12-27 | Disposition: A | Discharge status: Home / Self Care | Payer: Medicare Other | Source: Ambulatory Visit | Attending: Hematology & Oncology | Admitting: Hematology & Oncology

## 2020-12-27 ENCOUNTER — Inpatient Hospital Stay: Payer: Medicare Other

## 2020-12-27 DIAGNOSIS — C8590 Non-Hodgkin lymphoma, unspecified, unspecified site: Secondary | ICD-10-CM

## 2020-12-27 DIAGNOSIS — D509 Iron deficiency anemia, unspecified: Secondary | ICD-10-CM | POA: Diagnosis not present

## 2020-12-27 DIAGNOSIS — Z85820 Personal history of malignant melanoma of skin: Secondary | ICD-10-CM | POA: Diagnosis not present

## 2020-12-27 DIAGNOSIS — D3500 Benign neoplasm of unspecified adrenal gland: Secondary | ICD-10-CM | POA: Diagnosis not present

## 2020-12-27 DIAGNOSIS — D7389 Other diseases of spleen: Secondary | ICD-10-CM | POA: Diagnosis not present

## 2020-12-27 DIAGNOSIS — Z794 Long term (current) use of insulin: Secondary | ICD-10-CM | POA: Diagnosis not present

## 2020-12-27 LAB — GLUCOSE, CAPILLARY: Glucose-Capillary: 145 mg/dL — ABNORMAL HIGH (ref 70–99)

## 2020-12-27 MED ORDER — FLUDEOXYGLUCOSE F - 18 (FDG) INJECTION: 7.0000 | Freq: Once | INTRAVENOUS | Status: DC | PRN

## 2020-12-28 ENCOUNTER — Encounter: Payer: Self-pay | Admitting: Hematology & Oncology

## 2020-12-28 ENCOUNTER — Inpatient Hospital Stay: Payer: Medicare Other | Attending: Internal Medicine

## 2020-12-28 ENCOUNTER — Inpatient Hospital Stay: Payer: Medicare Other | Admitting: Hematology & Oncology

## 2020-12-28 ENCOUNTER — Other Ambulatory Visit: Payer: Self-pay

## 2020-12-28 ENCOUNTER — Encounter: Payer: Self-pay | Admitting: *Deleted

## 2020-12-28 VITALS — BP 135/64 | HR 64 | Temp 98.2°F | Resp 18 | Wt 140.2 lb

## 2020-12-28 DIAGNOSIS — C8338 Diffuse large B-cell lymphoma, lymph nodes of multiple sites: Secondary | ICD-10-CM | POA: Insufficient documentation

## 2020-12-28 DIAGNOSIS — C7931 Secondary malignant neoplasm of brain: Secondary | ICD-10-CM | POA: Diagnosis not present

## 2020-12-28 DIAGNOSIS — Z794 Long term (current) use of insulin: Secondary | ICD-10-CM | POA: Diagnosis not present

## 2020-12-28 DIAGNOSIS — R42 Dizziness and giddiness: Secondary | ICD-10-CM | POA: Insufficient documentation

## 2020-12-28 DIAGNOSIS — Z79899 Other long term (current) drug therapy: Secondary | ICD-10-CM | POA: Diagnosis not present

## 2020-12-28 DIAGNOSIS — C8510 Unspecified B-cell lymphoma, unspecified site: Secondary | ICD-10-CM

## 2020-12-28 DIAGNOSIS — Z85820 Personal history of malignant melanoma of skin: Secondary | ICD-10-CM | POA: Diagnosis not present

## 2020-12-28 DIAGNOSIS — Z8639 Personal history of other endocrine, nutritional and metabolic disease: Secondary | ICD-10-CM | POA: Diagnosis not present

## 2020-12-28 DIAGNOSIS — Z5111 Encounter for antineoplastic chemotherapy: Secondary | ICD-10-CM | POA: Insufficient documentation

## 2020-12-28 DIAGNOSIS — Z7952 Long term (current) use of systemic steroids: Secondary | ICD-10-CM | POA: Diagnosis not present

## 2020-12-28 DIAGNOSIS — D509 Iron deficiency anemia, unspecified: Secondary | ICD-10-CM | POA: Insufficient documentation

## 2020-12-28 DIAGNOSIS — D500 Iron deficiency anemia secondary to blood loss (chronic): Secondary | ICD-10-CM

## 2020-12-28 LAB — CMP (CANCER CENTER ONLY)
ALT: 37 U/L (ref 0–44)
AST: 18 U/L (ref 15–41)
Albumin: 3.2 g/dL — ABNORMAL LOW (ref 3.5–5.0)
Alkaline Phosphatase: 81 U/L (ref 38–126)
Anion gap: 7 (ref 5–15)
BUN: 16 mg/dL (ref 8–23)
CO2: 30 mmol/L (ref 22–32)
Calcium: 9.9 mg/dL (ref 8.9–10.3)
Chloride: 94 mmol/L — ABNORMAL LOW (ref 98–111)
Creatinine: 0.66 mg/dL (ref 0.44–1.00)
GFR, Estimated: >60 mL/min (ref >60)
Glucose, Bld: 178 mg/dL — ABNORMAL HIGH (ref 70–99)
Potassium: 4.2 mmol/L (ref 3.5–5.1)
Sodium: 131 mmol/L — ABNORMAL LOW (ref 135–145)
Total Bilirubin: 0.4 mg/dL (ref 0.3–1.2)
Total Protein: 7.2 g/dL (ref 6.5–8.1)

## 2020-12-28 LAB — CBC WITH DIFFERENTIAL (CANCER CENTER ONLY)
Abs Immature Granulocytes: 0.11 10*3/uL — ABNORMAL HIGH (ref 0.00–0.07)
Basophils Absolute: 0 10*3/uL (ref 0.0–0.1)
Basophils Relative: 0 %
Eosinophils Absolute: 0 10*3/uL (ref 0.0–0.5)
Eosinophils Relative: 0 %
HCT: 34.2 % — ABNORMAL LOW (ref 36.0–46.0)
Hemoglobin: 10.8 g/dL — ABNORMAL LOW (ref 12.0–15.0)
Immature Granulocytes: 1 %
Lymphocytes Relative: 10 %
Lymphs Abs: 1.1 10*3/uL (ref 0.7–4.0)
MCH: 24.4 pg — ABNORMAL LOW (ref 26.0–34.0)
MCHC: 31.6 g/dL (ref 30.0–36.0)
MCV: 77.2 fL — ABNORMAL LOW (ref 80.0–100.0)
Monocytes Absolute: 0.4 10*3/uL (ref 0.1–1.0)
Monocytes Relative: 3 %
Neutro Abs: 9.3 10*3/uL — ABNORMAL HIGH (ref 1.7–7.7)
Neutrophils Relative %: 86 %
Platelet Count: 380 10*3/uL (ref 150–400)
RBC: 4.43 MIL/uL (ref 3.87–5.11)
RDW: 19.2 % — ABNORMAL HIGH (ref 11.5–15.5)
WBC Count: 10.8 10*3/uL — ABNORMAL HIGH (ref 4.0–10.5)
nRBC: 0 % (ref 0.0–0.2)

## 2020-12-28 LAB — RETICULOCYTES
Immature Retic Fract: 11.6 % (ref 2.3–15.9)
RBC.: 4.46 MIL/uL (ref 3.87–5.11)
Retic Count, Absolute: 83.8 10*3/uL (ref 19.0–186.0)
Retic Ct Pct: 1.9 % (ref 0.4–3.1)

## 2020-12-28 MED ORDER — ALLOPURINOL 100 MG PO TABS: 100.0000 mg | ORAL_TABLET | Freq: Every day | ORAL | 0 refills | Status: DC

## 2020-12-28 MED ORDER — FAMCICLOVIR 250 MG PO TABS: 250.0000 mg | ORAL_TABLET | Freq: Every day | ORAL | 8 refills | Status: DC

## 2020-12-28 NOTE — Progress Notes (Signed)
START OFF PATHWAY REGIMEN - Lymphoma and CLL   [id number]:R-ICE (Ifosfamide D2,3,4) q21 Days (Outpatient):   A cycle is every 21 days:     Rituximab-xxxx      Ifosfamide      Mesna      Mesna      Carboplatin      Etoposide      Pegfilgrastim-xxxx   **Always confirm dose/schedule in your pharmacy ordering system**  Patient Characteristics: Diffuse Large B-Cell Lymphoma or Follicular Lymphoma, Grade 3B, First Line, Stage III and IV Disease Type: Not Applicable Disease Type: Diffuse Large B-Cell Lymphoma Disease Type: Not Applicable Line of therapy: First Line Intent of Therapy: Curative Intent, Discussed with Patient

## 2020-12-28 NOTE — Progress Notes (Signed)
Hematology and Oncology Follow Up Visit  Ariel [name] [id number] 01/07/1956 65 y.o. 12/28/2020   Principle Diagnosis:  Diffuse large cell non-Hodgkin's lymphoma-stage IV-CNS disease  Current Therapy:   R-ICE --start cycle 1 on 01/04/2021     Interim History:  Ariel [name] is back for follow-up.  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  I had seen her initially because she was just iron deficient.  Surprisingly enough, she was ultimately admitted to the hospital because of mental status changes.  She was found to have an MRI to lesion in the left cerebral hemisphere.  The largest measuring 1.9 x 1.9 x 2.2 cm.  The other lesion was in the left parietal lobe measuring 0.9 x 1.3 cm.  She had a CT scan done.  CT scan showed some adenopathy in the perihepatic area.  She did undergo an endoscopic ultrasound.  I must say Gastroenterology did a fantastic job.  She had a biopsy of a lymph node.  The pathology report ([id number]) showed high-grade B-cell lymphoma.  Unfortunately there is nothing that we could easily get a excisional biopsy from.  She was placed on steroids which helped her CNS disease.  Her mental status improved.  She had a PET scan done.  This was done on 12/27/2020.  This did show extensive adenopathy in the chest and abdomen/pelvis.  She comes in today with her son.  A daughter, who lives in [place], was on the cell phone.  She is doing much better.  She walks with the cane.  However, her mental state is almost back to baseline.  This is a very complicated case.  She will clearly need systemic chemotherapy and at some point some therapy to help with the CNS disease.  I think a good protocol for her would be R-ICE.  This would get CNS penetration.  I think she could tolerate this.  Otherwise she is quite healthy.  I do think that she would be a candidate for stem cell transplant or possibly even CAR-T therapy if we can get her into remission.  She will need to have a Port-A-Cath  placed.  She is eating well.  She is having no problem with bowels or bladder.  She is on Decadron right now.  She is on 4 mg twice a day.  She is also on Keppra.  Overall, I would say there is no problems with fever.  She has had no headache.  She has had no mouth sores.  She did have some mouth discomfort.  I told her to rinse with water and baking soda.  She has not noted any swollen lymph nodes.  Is no cough or shortness of breath.  She has had no bleeding.  There is been no bruising.  Overall, I would say performance status is probably ECOG 1.  Medications:  Current Outpatient Medications:    dexamethasone (DECADRON) 4 MG tablet, Take 1 tablet (4 mg total) by mouth 2 (two) times daily., Disp: 60 tablet, Rfl: 1   levETIRAcetam (KEPPRA) 500 MG tablet, Take 1 tablet (500 mg total) by mouth 2 (two) times daily., Disp: 60 tablet, Rfl: 2 No current facility-administered medications for this visit.  Facility-Administered Medications Ordered in Other Visits:    fludeoxyglucose F - 18 (FDG) injection 7 millicurie, 7 millicurie, Intravenous, Once PRN, Ariel Miyamoto, MD  Allergies:  Allergies  Allergen Reactions   Codeine Nausea Only   Plavix [Clopidogrel]     Hives,lip swelling     Past Medical History, Surgical history,  Social history, and Family History were reviewed and updated.  Review of Systems: Review of Systems  Constitutional: Negative.   HENT:  Negative.    Eyes: Negative.   Respiratory: Negative.    Cardiovascular: Negative.   Gastrointestinal: Negative.   Endocrine: Negative.   Genitourinary: Negative.    Musculoskeletal: Negative.   Skin: Negative.   Neurological:  Positive for dizziness.  Hematological: Negative.   Psychiatric/Behavioral: Negative.     Physical Exam:  weight is 140 lb 4 oz (63.6 kg). Her oral temperature is 98.2 F (36.8 C). Her blood pressure is 135/64 and her pulse is 64. Her respiration is 18 and oxygen saturation is 98%.   Wt Readings  from Last 3 Encounters:  12/28/20 140 lb 4 oz (63.6 kg)  12/22/20 142 lb (64.4 kg)  12/14/20 142 lb 13.7 oz (64.8 kg)    Physical Exam Vitals reviewed.  HENT:     Head: Normocephalic and atraumatic.  Eyes:     Pupils: Pupils are equal, round, and reactive to light.  Cardiovascular:     Rate and Rhythm: Normal rate and regular rhythm.     Heart sounds: Normal heart sounds.  Pulmonary:     Effort: Pulmonary effort is normal.     Breath sounds: Normal breath sounds.  Abdominal:     General: Bowel sounds are normal.     Palpations: Abdomen is soft.  Musculoskeletal:        General: No tenderness or deformity. Normal range of motion.     Cervical back: Normal range of motion.  Lymphadenopathy:     Cervical: No cervical adenopathy.  Skin:    General: Skin is warm and dry.     Findings: No erythema or rash.  Neurological:     Mental Status: She is alert and oriented to person, place, and time.  Psychiatric:        Behavior: Behavior normal.        Thought Content: Thought content normal.        Judgment: Judgment normal.     Lab Results  Component Value Date   WBC 10.8 (H) 12/28/2020   HGB 10.8 (L) 12/28/2020   HCT 34.2 (L) 12/28/2020   MCV 77.2 (L) 12/28/2020   PLT 380 12/28/2020     Chemistry      Component Value Date/Time   NA 131 (L) 12/28/2020 1301   K 4.2 12/28/2020 1301   CL 94 (L) 12/28/2020 1301   CO2 30 12/28/2020 1301   BUN 16 12/28/2020 1301   CREATININE 0.66 12/28/2020 1301   CREATININE 0.68 11/18/2012 1439      Component Value Date/Time   CALCIUM 9.9 12/28/2020 1301   ALKPHOS 81 12/28/2020 1301   AST 18 12/28/2020 1301   ALT 37 12/28/2020 1301   BILITOT 0.4 12/28/2020 1301      Impression and Plan: Ariel [name] is a very charming 65 year old white female.  Of note, she had a stage I melanoma removed from her right hip a year ago.  I really thought that this was the source of the CNS lesions and the lymphadenopathy.  Again, she had the upper  endoscopy with endoscopic biopsy.  This showed large cell lymphoma.  She is in good shape.  I think that the R-ICE would be a very reasonable protocol that we can try.  At some point, we may have to consider a high-dose methotrexate protocol for the CNS disease if we cannot get a good response with R-ICE.  I spent a good hour or so with she and her family.  I gave him information sheets about the chemotherapy protocol.  I went over the side effects.  She will lose her hair.  She will need to be on antibiotic prophylaxis.  I will plan to get her on some allopurinol to help make sure there is no tumor lysis issues.  Again, she will need to have a Port-A-Cath placed.  I would not think that we have to do a bone marrow biopsy on her right now.  I still think this is going to change our protocol.  I would rescan her with a PET scan after her second cycle of treatment.  I would do a MRI of the brain after the second cycle of treatment so that we can see what count response that she has had.  I would think that after the second cycle of therapy, that she will have a systemic remission.  Hopefully, she will have a very good response in the brain.  Again this is very complicated.  She is in great health so we want to be aggressive.   Volanda Napoleon, MD 11/1/20226:03 PM

## 2020-12-28 NOTE — Progress Notes (Signed)
Patient was seen as a new patient several weeks ago for anemia workup. She has since been hospitalized and was found to have high grade lymphoma.   Initial RN Navigator Patient Visit  Name: Ariel [name] Diagnosis: High Grade Lymphoma  Met with patient prior to their visit with MD. Hanley Seamen patient "Your Patient Navigator" handout which explains my role, areas in which I am able to help, and all the contact information for myself and the office. Also gave patient MD and Navigator business card. Reviewed with patient the general overview of expected course after initial diagnosis and time frame for all steps to be completed.  New patient packet given to patient which includes: orientation to office and staff; campus directory; education on My Chart and Advance Directives; and patient centered education on lymphoma.   Patient completed visit with Dr. Marin Olp.   Port placement already scheduled for 01/06/2021.  Plan for outpatient R-ICE. She will need chemo education.   Patient understands all follow up procedures and expectations. They have my number to reach out for any further clarification or additional needs.    Will follow up tomorrow after determining treatment start date as her port won't be placed until 11/10 and the need for chemo ed.  Oncology Nurse Navigator Documentation  Oncology Nurse Navigator Flowsheets 12/28/2020  Abnormal Finding Date -  Confirmed Diagnosis Date -  Navigator Follow Up Date: 12/29/2020  Navigator Follow Up Reason: Appointment Review  Navigator Location [facility]  Navigator Encounter Type Initial MedOnc  Patient Visit Type MedOnc  Treatment Phase Active Tx  Barriers/Navigation Needs Coordination of Care;Education  Education Newly Diagnosed Cancer Education;Other  Interventions Education;Psycho-Social Support  Acuity Level 2-Minimal Needs (1-2 Barriers Identified)  Coordination of Care -  Education Method Verbal;Written  Support Groups/Services  Friends and Family  Time Spent with Patient 32

## 2020-12-29 ENCOUNTER — Encounter: Payer: Self-pay | Admitting: *Deleted

## 2020-12-29 ENCOUNTER — Other Ambulatory Visit: Payer: Self-pay | Admitting: Pharmacist

## 2020-12-29 ENCOUNTER — Other Ambulatory Visit: Payer: Self-pay | Admitting: Radiology

## 2020-12-29 ENCOUNTER — Encounter: Payer: Self-pay | Admitting: Hematology & Oncology

## 2020-12-29 ENCOUNTER — Telehealth: Payer: Self-pay | Admitting: Hematology & Oncology

## 2020-12-29 ENCOUNTER — Ambulatory Visit: Payer: Medicare Other

## 2020-12-29 DIAGNOSIS — Z7952 Long term (current) use of systemic steroids: Secondary | ICD-10-CM | POA: Diagnosis not present

## 2020-12-29 DIAGNOSIS — Z9181 History of falling: Secondary | ICD-10-CM | POA: Diagnosis not present

## 2020-12-29 DIAGNOSIS — R590 Localized enlarged lymph nodes: Secondary | ICD-10-CM | POA: Diagnosis not present

## 2020-12-29 DIAGNOSIS — E278 Other specified disorders of adrenal gland: Secondary | ICD-10-CM | POA: Diagnosis not present

## 2020-12-29 DIAGNOSIS — M47812 Spondylosis without myelopathy or radiculopathy, cervical region: Secondary | ICD-10-CM | POA: Diagnosis not present

## 2020-12-29 DIAGNOSIS — G9341 Metabolic encephalopathy: Secondary | ICD-10-CM | POA: Diagnosis not present

## 2020-12-29 DIAGNOSIS — R4701 Aphasia: Secondary | ICD-10-CM | POA: Diagnosis not present

## 2020-12-29 DIAGNOSIS — I6523 Occlusion and stenosis of bilateral carotid arteries: Secondary | ICD-10-CM | POA: Diagnosis not present

## 2020-12-29 DIAGNOSIS — E871 Hypo-osmolality and hyponatremia: Secondary | ICD-10-CM | POA: Diagnosis not present

## 2020-12-29 DIAGNOSIS — D509 Iron deficiency anemia, unspecified: Secondary | ICD-10-CM | POA: Diagnosis not present

## 2020-12-29 DIAGNOSIS — Z85820 Personal history of malignant melanoma of skin: Secondary | ICD-10-CM | POA: Diagnosis not present

## 2020-12-29 DIAGNOSIS — C7931 Secondary malignant neoplasm of brain: Secondary | ICD-10-CM | POA: Diagnosis not present

## 2020-12-29 DIAGNOSIS — Z8673 Personal history of transient ischemic attack (TIA), and cerebral infarction without residual deficits: Secondary | ICD-10-CM | POA: Diagnosis not present

## 2020-12-29 DIAGNOSIS — D630 Anemia in neoplastic disease: Secondary | ICD-10-CM | POA: Diagnosis not present

## 2020-12-29 DIAGNOSIS — I10 Essential (primary) hypertension: Secondary | ICD-10-CM | POA: Diagnosis not present

## 2020-12-29 DIAGNOSIS — E7800 Pure hypercholesterolemia, unspecified: Secondary | ICD-10-CM | POA: Diagnosis not present

## 2020-12-29 LAB — IRON AND TIBC
Iron: 21 ug/dL — ABNORMAL LOW (ref 41–142)
Saturation Ratios: 12 % — ABNORMAL LOW (ref 21–57)
TIBC: 183 ug/dL — ABNORMAL LOW (ref 236–444)
UIBC: 161 ug/dL (ref 120–384)

## 2020-12-29 LAB — FERRITIN: Ferritin: 797 ng/mL — ABNORMAL HIGH (ref 11–307)

## 2020-12-29 NOTE — Progress Notes (Signed)
Dr Marin Olp would like chemo to start next week therefor the 01/06/21 port appointment needs to be moved to a sooner date.   Able to move port placement to [facility] and have scheduled for tomorrow. Spoke to patient's son, [place], and he is aware of appointment tomorrow, for patient to be NPO after midnight, she will need to arrive at 8am, and that she will need a driver and caregiver.   Also reviewed her tentative chemo schedule for next week. Right now we are waiting on insurance authorization, but chemo ed scheduled for before day one, on 01/04/21 at 8am. He knows he will get the rest of the appointments once auth is obtained.   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

## 2020-12-30 ENCOUNTER — Other Ambulatory Visit: Payer: Self-pay

## 2020-12-30 ENCOUNTER — Ambulatory Visit: Payer: Medicare Other | Admitting: Gastroenterology

## 2020-12-30 ENCOUNTER — Encounter (HOSPITAL_COMMUNITY): Payer: Self-pay

## 2020-12-30 ENCOUNTER — Ambulatory Visit (HOSPITAL_COMMUNITY)
Admission: RE | Admit: 2020-12-30 | Discharge: 2020-12-30 | Disposition: A | Discharge status: Home / Self Care | Payer: Medicare Other | Source: Ambulatory Visit | Attending: Hematology & Oncology | Admitting: Hematology & Oncology

## 2020-12-30 DIAGNOSIS — C8510 Unspecified B-cell lymphoma, unspecified site: Secondary | ICD-10-CM | POA: Insufficient documentation

## 2020-12-30 DIAGNOSIS — Z79899 Other long term (current) drug therapy: Secondary | ICD-10-CM | POA: Insufficient documentation

## 2020-12-30 DIAGNOSIS — Z85820 Personal history of malignant melanoma of skin: Secondary | ICD-10-CM | POA: Diagnosis not present

## 2020-12-30 DIAGNOSIS — C8590 Non-Hodgkin lymphoma, unspecified, unspecified site: Secondary | ICD-10-CM | POA: Diagnosis not present

## 2020-12-30 DIAGNOSIS — Z888 Allergy status to other drugs, medicaments and biological substances status: Secondary | ICD-10-CM | POA: Diagnosis not present

## 2020-12-30 DIAGNOSIS — Z885 Allergy status to narcotic agent status: Secondary | ICD-10-CM | POA: Diagnosis not present

## 2020-12-30 DIAGNOSIS — E7800 Pure hypercholesterolemia, unspecified: Secondary | ICD-10-CM | POA: Diagnosis not present

## 2020-12-30 DIAGNOSIS — C8330 Diffuse large B-cell lymphoma, unspecified site: Secondary | ICD-10-CM | POA: Diagnosis not present

## 2020-12-30 DIAGNOSIS — Z452 Encounter for adjustment and management of vascular access device: Secondary | ICD-10-CM | POA: Diagnosis not present

## 2020-12-30 DIAGNOSIS — Z803 Family history of malignant neoplasm of breast: Secondary | ICD-10-CM | POA: Insufficient documentation

## 2020-12-30 HISTORY — PX: IR IMAGING GUIDED PORT INSERTION: IMG5740

## 2020-12-30 MED ORDER — HEPARIN SOD (PORK) LOCK FLUSH 100 UNIT/ML IV SOLN
INTRAVENOUS | Status: AC | PRN
  Administered 2020-12-30: 500 [IU]

## 2020-12-30 MED ORDER — SODIUM CHLORIDE 0.9 % IV SOLN: INTRAVENOUS | Status: DC

## 2020-12-30 MED ORDER — MIDAZOLAM HCL 2 MG/2ML IJ SOLN
INTRAMUSCULAR | Status: AC
  Filled 2020-12-30: qty 2

## 2020-12-30 MED ORDER — LIDOCAINE-EPINEPHRINE 1 %-1:100000 IJ SOLN
INTRAMUSCULAR | Status: AC
  Filled 2020-12-30: qty 1

## 2020-12-30 MED ORDER — FENTANYL CITRATE (PF) 100 MCG/2ML IJ SOLN
INTRAMUSCULAR | Status: AC
  Filled 2020-12-30: qty 2

## 2020-12-30 MED ORDER — FENTANYL CITRATE (PF) 100 MCG/2ML IJ SOLN
INTRAMUSCULAR | Status: AC | PRN
  Administered 2020-12-30: 25 ug via INTRAVENOUS

## 2020-12-30 MED ORDER — HEPARIN SOD (PORK) LOCK FLUSH 100 UNIT/ML IV SOLN
INTRAVENOUS | Status: AC
  Filled 2020-12-30: qty 5

## 2020-12-30 MED ORDER — LIDOCAINE-EPINEPHRINE 1 %-1:100000 IJ SOLN
INTRAMUSCULAR | Status: AC | PRN
  Administered 2020-12-30: 20 mL

## 2020-12-30 MED ORDER — MIDAZOLAM HCL 2 MG/2ML IJ SOLN
INTRAMUSCULAR | Status: AC | PRN
  Administered 2020-12-30: 1 mg via INTRAVENOUS

## 2020-12-30 NOTE — Procedures (Signed)
Interventional Radiology Procedure Note  Procedure: Placement of a right IJ approach single lumen PowerPort.  Tip is positioned at the superior cavoatrial junction and catheter is ready for immediate use.  Complications: No immediate Recommendations:  - Ok to shower tomorrow - Do not submerge for 7 days - Routine line care   Signed,  Kirbie Stodghill K. Imraan Wendell, MD   

## 2020-12-30 NOTE — H&P (Signed)
Chief Complaint: Patient was seen in consultation today for Port-A-Cath placement  at the request of Ennever,Peter R  Referring Physician(s): Ennever,Peter R  Supervising Physician: Jacqulynn Cadet  Patient Status: [facility] - Out-pt  History of Present Illness: Ariel [name] is a 65 y.o. female with PMH of hypercholesterolemia, large cell non-Hodgkin's lymphoma stage IV, hyperglycemia, melanoma, postoperative nausea and vomiting and urinary incontinence. Pt had lymph node biopsy 12/24/20 that resulted high-grade B-cell lymphoma. She had MR brain 12/13/20 that was showed multiple brain lesions concerning for metastatic disease.  Pt is referred here today by Dr. Pearletha Alfred for port-a-cath placement to start chemotherapy.   Past Medical History:  Diagnosis Date   Chicken pox    Complication of anesthesia    Per patient, very slow to wake up after anesthesia   Goals of care, counseling/discussion 12/22/2020   High cholesterol    High grade B-cell lymphoma ([place]) 12/22/2020   Hyperglycemia 11/10/2020   Melanoma ([place]) 2021   right hip   PONV (postoperative nausea and vomiting)    Urinary incontinence     Past Surgical History:  Procedure Laterality Date   AIR/FLUID EXCHANGE Right 12/07/2020   Procedure: AIR/FLUID EXCHANGE;  Surgeon: Jalene Mullet, MD;  Location: [place];  Service: Ophthalmology;  Laterality: Right;   APPENDECTOMY  1962   BIOPSY  12/14/2020   Procedure: BIOPSY;  Surgeon: Rush Landmark Telford Nab., MD;  Location: [place];  Service: Gastroenterology;;   ESOPHAGOGASTRODUODENOSCOPY (EGD) WITH PROPOFOL N/A 12/14/2020   Procedure: ESOPHAGOGASTRODUODENOSCOPY (EGD) WITH PROPOFOL;  Surgeon: Irving Copas., MD;  Location: [place];  Service: Gastroenterology;  Laterality: N/A;   EXCISION MELANOMA WITH SENTINEL LYMPH NODE BIOPSY Right 10/09/2019   Procedure: WIDE LOCAL EXCISION WITH ADVANCEMENT FLAP CLOSURE RIGHT HIP MELANOMA WITH SENTINEL LYMPH NODE BIOPSY;   Surgeon: Stark Klein, MD;  Location: [place];  Service: General;  Laterality: Right;   FINE NEEDLE ASPIRATION  12/14/2020   Procedure: FINE NEEDLE ASPIRATION (FNA) LINEAR;  Surgeon: Irving Copas., MD;  Location: [place];  Service: Gastroenterology;;   HERNIA REPAIR  2009   MOUTH SURGERY  11/2015   gum surgery and bone implant   PARS PLANA VITRECTOMY Right 12/07/2020   Procedure: PARS PLANA VITRECTOMY WITH 25 GAUGE;  Surgeon: Jalene Mullet, MD;  Location: [place];  Service: Ophthalmology;  Laterality: Right;   PHOTOCOAGULATION WITH LASER Right 12/07/2020   Procedure: PHOTOCOAGULATION WITH ENDOLASER PANRITINAL COAGULATION;  Surgeon: Jalene Mullet, MD;  Location: [place];  Service: Ophthalmology;  Laterality: Right;   UPPER ESOPHAGEAL ENDOSCOPIC ULTRASOUND (EUS) N/A 12/14/2020   Procedure: UPPER ESOPHAGEAL ENDOSCOPIC ULTRASOUND (EUS);  Surgeon: Irving Copas., MD;  Location: [place];  Service: Gastroenterology;  Laterality: N/A;   WISDOM TOOTH EXTRACTION      Allergies: Codeine and Plavix [clopidogrel]  Medications: Prior to Admission medications   Medication Sig Start Date End Date Taking? Authorizing Provider  allopurinol (ZYLOPRIM) 100 MG tablet Take 1 tablet (100 mg total) by mouth daily. Start taking for 1 month only on 01/01/2021 12/28/20  Yes Ennever, Rudell Cobb, MD  dexamethasone (DECADRON) 4 MG tablet Take 1 tablet (4 mg total) by mouth 2 (two) times daily. 12/16/20 02/14/21 Yes Charlynne Cousins, MD  levETIRAcetam (KEPPRA) 500 MG tablet Take 1 tablet (500 mg total) by mouth 2 (two) times daily. 12/16/20 03/16/21 Yes Charlynne Cousins, MD  famciclovir (FAMVIR) 250 MG tablet Take 1 tablet (250 mg total) by mouth daily. 12/28/20   Volanda Napoleon, MD  Family History  Problem Relation Age of Onset   Coronary artery disease Father        died at 78   COPD Father    Alcohol abuse Father    Diabetes Mellitus II Father    Coronary artery disease Sister     Cancer Sister        breast   Heart attack Brother    Hyperlipidemia Other        died 105- "natural causes"   Hyperlipidemia Other        4 siblings    Hypertension Other        3 siblings    Social History   Socioeconomic History   Marital status: Widowed    Spouse name: Not on file   Number of children: Not on file   Years of education: Not on file   Highest education level: Not on file  Occupational History   Occupation: retired  Tobacco Use   Smoking status: Never   Smokeless tobacco: Never  Vaping Use   Vaping Use: Never used  Substance and Sexual Activity   Alcohol use: Never   Drug use: Never   Sexual activity: Not Currently  Other Topics Concern   Not on file  Social History Narrative   Lives with husband   One Son- lives locally, 1 grand-daughter   Daughter- Sport and exercise psychologist. [place]   Completed Hotel manager school   Works as a Probation officer.         Social Determinants of Radio broadcast assistant Strain: Low Risk    Difficulty of Paying Living Expenses: Not hard at all  Food Insecurity: No Food Insecurity   Worried About Charity fundraiser in the Last Year: Never true   Arboriculturist in the Last Year: Never true  Transportation Needs: No Transportation Needs   Lack of Transportation (Medical): No   Lack of Transportation (Non-Medical): No  Physical Activity: Sufficiently Active   Days of Exercise per Week: 4 days   Minutes of Exercise per Session: 40 min  Stress: No Stress Concern Present   Feeling of Stress : Not at all  Social Connections: Moderately Isolated   Frequency of Communication with Friends and Family: More than three times a week   Frequency of Social Gatherings with Friends and Family: More than three times a week   Attends Religious Services: More than 4 times per year   Active Member of [employer] or Organizations: No   Attends Archivist Meetings: Never   Marital Status: Widowed     Review of Systems: A 12 point ROS  discussed and pertinent positives are indicated in the HPI above.  All other systems are negative.  Review of Systems  Constitutional:  Negative for appetite change, chills and fever.  HENT:  Negative for nosebleeds.   Eyes:  Positive for redness. Negative for visual disturbance.       Pt had recent R eye surgery to remove blood clot  Respiratory:  Negative for cough and shortness of breath.   Cardiovascular:  Negative for chest pain, palpitations and leg swelling.  Gastrointestinal:  Negative for abdominal pain, blood in stool, nausea and vomiting.  Genitourinary:  Negative for hematuria.  Neurological:  Negative for dizziness, light-headedness and headaches.   Vital Signs: BP (!) 149/73   Pulse 71   Temp 98.1 F (36.7 C) (Oral)   Resp 12   Ht 5\' 6"  (1.676 m)   Wt 143 lb (  64.9 kg)   SpO2 99%   BMI 23.08 kg/m   Physical Exam Vitals reviewed.  Constitutional:      Appearance: Normal appearance.  HENT:     Head: Normocephalic and atraumatic.     Mouth/Throat:     Mouth: Mucous membranes are dry.     Pharynx: Oropharynx is clear.  Eyes:     Comments: R pupil dilated from recent R eye surgery  Cardiovascular:     Rate and Rhythm: Normal rate and regular rhythm.     Pulses: Normal pulses.     Heart sounds: Normal heart sounds. No murmur heard.   No gallop.  Pulmonary:     Effort: No respiratory distress.     Breath sounds: Normal breath sounds. No stridor. No wheezing, rhonchi or rales.  Abdominal:     General: Bowel sounds are normal. There is no distension.     Palpations: Abdomen is soft.     Tenderness: There is no abdominal tenderness. There is no guarding.  Musculoskeletal:     Right lower leg: No edema.     Left lower leg: No edema.  Skin:    General: Skin is warm and dry.  Neurological:     Mental Status: She is alert and oriented to person, place, and time.  Psychiatric:        Mood and Affect: Mood normal.        Behavior: Behavior normal.        Thought  Content: Thought content normal.        Judgment: Judgment normal.    Imaging: MR BRAIN W WO CONTRAST  Addendum Date: 12/13/2020   ADDENDUM REPORT: 12/13/2020 10:56 ADDENDUM: There is additional ill-defined enhancement in the left postcentral gyrus with associated diffusion restriction suspicious for an additional metastatic lesion (9-43). This was discussed at multidisciplinary conference on the morning of 12/13/2020. Electronically Signed   By: Valetta Mole M.D.   On: 12/13/2020 10:56   Result Date: 12/13/2020 CLINICAL DATA:  History of melanoma, evaluate for metastatic disease EXAM: MRI HEAD WITHOUT AND WITH CONTRAST TECHNIQUE: Multiplanar, multiecho pulse sequences of the brain and surrounding structures were obtained without and with intravenous contrast. CONTRAST:  26mL GADAVIST GADOBUTROL 1 MMOL/ML IV SOLN COMPARISON:  Same-day noncontrast CT head, brain MRI 10/07/2020 FINDINGS: Brain: There is an enhancing lesion in the left posterior temporal lobe measuring 1.9 cm by 1.9 cm x 2.2 cm consistent with metastatic disease. There is surrounding vasogenic edema resulting in effacement of the adjacent sulci and slight effacement of the occipital horn of the left lateral ventricle. There is an additional enhancing lesion in the left superior parietal lobule measuring approximately 0.9 cm TV by 1.3 cm cc in the coronal plane. The lesion is not well evaluated in the axial plane due to motion artifact. There is mild vasogenic edema surrounding this lesion without significant mass effect. No other enhancing lesions are seen. There is no evidence of acute intracranial hemorrhage or infarct. A small focus of susceptibility in the left frontal lobe is nonspecific. There is no midline shift. Vascular: Normal flow voids. Skull and upper cervical spine: There is no definite abnormal signal abnormality, though the upper cervical spine is not well evaluated due to significant motion artifact. Sinuses/Orbits: The  paranasal sinuses are clear. T2 hypointensity in the right globe is consistent with tear related to a prior procedure as seen on prior CT. Other: None. IMPRESSION: Two enhancing lesions in the left cerebral hemisphere, the larger in the  posterior temporal lobe measuring up to 1.9 cm x 1.9 cm x 2.2 cm most concerning for metastatic disease given the patient's history and multiplicity. There is edema surrounding the lesions, more so in the left temporal lobe, without midline shift. Electronically Signed: By: Valetta Mole M.D. On: 12/08/2020 12:09   CT CHEST ABDOMEN PELVIS W CONTRAST  Result Date: 12/09/2020 CLINICAL DATA:  Melanoma of the right hip. Now with brain metastases. Staging. EXAM: CT CHEST, ABDOMEN, AND PELVIS WITH CONTRAST TECHNIQUE: Multidetector CT imaging of the chest, abdomen and pelvis was performed following the standard protocol during bolus administration of intravenous contrast. CONTRAST:  87mL OMNIPAQUE IOHEXOL 350 MG/ML SOLN COMPARISON:  None. FINDINGS: CT CHEST FINDINGS Cardiovascular: The heart size is normal. No substantial pericardial effusion. Mediastinum/Nodes: No mediastinal lymphadenopathy. There is no hilar lymphadenopathy. The esophagus has normal imaging features. There is no axillary lymphadenopathy. Lungs/Pleura: Minimal biapical pleuroparenchymal scarring. No suspicious pulmonary nodule or mass. No focal airspace consolidation. No pleural effusion. Musculoskeletal: No worrisome lytic or sclerotic osseous abnormality. CT ABDOMEN PELVIS FINDINGS Hepatobiliary: No suspicious focal abnormality within the liver parenchyma. There is no evidence for gallstones, gallbladder wall thickening, or pericholecystic fluid. No intrahepatic or extrahepatic biliary dilation. Pancreas: No focal mass lesion. No dilatation of the main duct. No intraparenchymal cyst. No peripancreatic edema. Spleen: 8 mm hypoattenuating lesion noted posterior spleen on 21/3 with 9 mm hypoattenuating lesion anterior  spleen on 22/3. Subtle tiny hypoattenuating inferior splenic lesion on 30/3. Adrenals/Urinary Tract: 17 mm right adrenal nodule evident. Kidneys unremarkable No evidence for hydroureter. The urinary bladder appears normal for the degree of distention. Stomach/Bowel: Stomach is unremarkable. No gastric wall thickening. No evidence of outlet obstruction. Duodenum is normally positioned as is the ligament of Treitz. No small bowel wall thickening. No small bowel dilatation. The terminal ileum is normal. No gross colonic mass. No colonic wall thickening. Vascular/Lymphatic: No abdominal aortic aneurysm relatively bulky lymphadenopathy is seen in the gastrohepatic ligament and celiac axis. 16 mm short axis node visible on 21/3. 16 mm lesion noted on 20/3. 12 mm short axis lymph node identified in the portal caval space on 27/3. Retroperitoneal lymphadenopathy evident with 13 mm short axis left para-aortic node on 30/3. No pelvic sidewall lymphadenopathy. Somewhat bulky Reproductive: The uterus is unremarkable.  There is no adnexal mass. Other: No intraperitoneal free fluid. Musculoskeletal: No worrisome lytic or sclerotic osseous abnormality. IMPRESSION: 1. Prominent lymphadenopathy in the gastrohepatic ligament and celiac axis with retroperitoneal lymphadenopathy. Imaging features compatible with metastatic disease. PET-CT may prove helpful to further evaluate, as clinically warranted. 2. 17 mm right adrenal nodule, indeterminate, but metastatic disease not excluded. 3. Tiny hypoattenuating splenic lesions. Attention on follow-up recommended. Electronically Signed   By: Misty Stanley M.D.   On: 12/09/2020 21:48   NM PET Image Initial (PI) Skull Base To Thigh  Result Date: 12/27/2020 CLINICAL DATA:  Initial treatment strategy for high-grade non-Hodgkin lymphoma found on retroperitoneal lymph node FNA. Also history of melanoma overlying the right hip prior resection. EXAM: NUCLEAR MEDICINE PET SKULL BASE TO THIGH  TECHNIQUE: 7.4 mCi F-18 FDG was injected intravenously. Full-ring PET imaging was performed from the skull base to thigh after the radiotracer. CT data was obtained and used for attenuation correction and anatomic localization. Fasting blood glucose: 145 mg/dl COMPARISON:  CT scan 12/09/2020 FINDINGS: Mediastinal blood pool activity: SUV max 2.0 Liver activity: SUV max 2.9 NECK: The very bottom margin of the known left posterior temporal lobe lesion might be the cause of  the faintly accentuated metabolic activity on the top most PET image, maximum SUV 18.9 in this vicinity compared to the normal contralateral side measuring 9.3. The more cephalad lesion is not included on today's PET-CT. Incidental CT findings: none CHEST: Clustered hypermetabolic left supraclavicular lymph nodes are present. A representative 0.7 cm in short axis lymph node on image 42 series 4 has maximum SUV of 7.6, [place] 5. A 0.5 cm in short axis paraesophageal lymph node on image 68 series 4 is faintly hypermetabolic with a maximum SUV of 4.0, [place] 4. Incidental CT findings: Atherosclerotic calcification of the aortic arch. ABDOMEN/PELVIS: Hypermetabolic splenic lesions, including a small medial splenic lesion maximum SUV 10.0, [place] 5. No splenomegaly. Hypermetabolic right gastric, peripancreatic, porta hepatis, portacaval, right retrocrural, retroperitoneal, and left common iliac adenopathy. Index peripancreatic node 2.6 cm in short axis on image 108 series 4, maximum SUV 18.5, [place] 5. Index right periaortic lymph node 1.1 cm in short axis on image 118 series 4, maximum SUV 14.8, [place] 5. Index left common iliac lymph node 1.1 cm in short axis on image 144 series 4, maximum SUV 9.9, [place] 5. Incidental substantial activity outside of the patient along the perineum compatible with urinary incontinence. The right adrenal nodule has a maximum SUV of only 2.8, and an internal density of 9 Hounsfield units, compatible with  adrenal adenoma. Incidental CT findings: Mild atherosclerosis is present, including aortoiliac atherosclerotic disease. SKELETON: No significant abnormal hypermetabolic activity in this region. Incidental CT findings: none IMPRESSION: 1. [place] 5 adenopathy in the right gastric, peripancreatic, porta hepatis, portacaval, right retrocrural, retroperitoneal, and left common iliac chains. 2. Deauville 5 left supraclavicular adenopathy and [place] 4 paraesophageal adenopathy. 3. [place]ville 5 focal splenic lesions, without splenomegaly. 4. Questionable capturing of the lower margin of the left posterior temporal lobe lesion given the asymmetry of activity on the top most images on the left compared to the right. Overall the intracranial lesions are mostly excluded. 5. Other imaging findings of potential clinical significance: Aortic Atherosclerosis (ICD10-I70.0). Right adrenal adenoma. Electronically Signed   By: Van Clines M.D.   On: 12/27/2020 17:35   CT HEAD CODE STROKE WO CONTRAST  Result Date: 12/08/2020 CLINICAL DATA:  Code stroke. EXAM: CT HEAD WITHOUT CONTRAST TECHNIQUE: Contiguous axial images were obtained from the base of the skull through the vertex without intravenous contrast. COMPARISON:  Brain MRI 10/07/2020 FINDINGS: Brain: There is edema in the left temporal lobe posteriorly which appears to spare the overlying gray matter. There is effacement of the surrounding sulci and partial effacement of the occipital horn of the left lateral ventricle. There is dish in all hypodensity in the left superior parietal lobule with sparing of the overlying cortex (5-49). There is no evidence of acute intracranial hemorrhage. There is no extra-axial fluid collection. There is no midline shift. Vascular: There is mild calcification of the intracranial ICAs. There is no dense vessel. Skull: Normal. Negative for fracture or focal lesion. Sinuses/Orbits: There is air in the right globe which is new since  the prior study, likely postprocedural. The imaged paranasal sinuses are clear. Other: None. ASPECTS [facility] Stroke Program Early CT Score) - Ganglionic level infarction (caudate, lentiform nuclei, internal capsule, insula, M1-M3 cortex): 7 - Supraganglionic infarction (M4-M6 cortex): 3 Total score (0-10 with 10 being normal): 10 IMPRESSION: 1. Edema in the left temporal and parietal lobes which appears to spare the overlying cortex is felt unlikely to be related to evolving infarct, and is more concerning for underlying mass lesion given  the history of mono melanoma. Recommend MRI of the brain with and without contrast for further evaluation. 2. ASPECTS is 10 3. Air in the right globe is likely postprocedural. These results were called by telephone at the time of interpretation on 12/08/2020 at 9:16 am to provider Dr Theda Sers , who verbally acknowledged these results. Electronically Signed   By: Valetta Mole M.D.   On: 12/08/2020 09:17   CT ANGIO HEAD NECK W WO CM W PERF (CODE STROKE)  Result Date: 12/08/2020 EXAM: CT ANGIOGRAPHY HEAD AND NECK CT PERFUSION BRAIN TECHNIQUE: Multidetector CT imaging of the head and neck was performed using the standard protocol during bolus administration of intravenous contrast. Multiplanar CT image reconstructions and MIPs were obtained to evaluate the vascular anatomy. Carotid stenosis measurements (when applicable) are obtained utilizing NASCET criteria, using the distal internal carotid diameter as the denominator. Multiphase CT imaging of the brain was performed following IV bolus contrast injection. Subsequent parametric perfusion maps were calculated using RAPID software. CONTRAST:  163mL OMNIPAQUE IOHEXOL 350 MG/ML SOLN COMPARISON:  Same day CT head, brain MRI 10/07/2020, CTA head/neck 10/06/2020 FINDINGS: CTA NECK FINDINGS Aortic arch: Standard branching. Imaged portion shows no evidence of aneurysm or dissection. No significant stenosis of the major arch vessel  origins. Right carotid system: No evidence of dissection, stenosis (50% or greater) or occlusion. Left carotid system: No evidence of dissection, stenosis (50% or greater) or occlusion. Vertebral arteries: Codominant. No evidence of dissection, stenosis (50% or greater) or occlusion. Skeleton: There is mild multilevel degenerative change of the cervical spine, most advanced at C5-C6. There is no suspicious osseous lesion. Other neck: The soft tissues are unremarkable. There is no pathologic lymphadenopathy in the neck. Upper chest: The lung apices are clear. Review of the MIP images confirms the above findings CTA HEAD FINDINGS Anterior circulation: There is mild calcification of the bilateral intracranial ICAs without significant stenosis or occlusion. The bilateral MCAs are patent. The bilateral ACAs are patent. There is no aneurysm. Posterior circulation: The bilateral V4 segments are patent. The basilar artery is patent. The bilateral PCAs are patent. There is no aneurysm. Venous sinuses: Patent. Anatomic variants: There is a prominent posterior communicating artery on the right. Review of the MIP images confirms the above findings CT Brain Perfusion Findings: CBF (<30%) Volume: 81mL Perfusion (Tmax>6.0s) volume: 35mL Mismatch Volume: N/a Infarction Location:N/a IMPRESSION: 1. Patent vasculature of the head and neck with no significant stenosis or occlusion. 2. No infarct core or penumbra identified. Electronically Signed   By: Valetta Mole M.D.   On: 12/08/2020 09:26    Labs:  CBC: Recent Labs    12/13/20 0312 12/15/20 0359 12/22/20 0955 12/28/20 1301  WBC 11.4* 9.4 11.8* 10.8*  HGB 10.6* 10.3* 10.7* 10.8*  HCT 35.1* 33.0* 33.6* 34.2*  PLT 400 384 417.0* 380    COAGS: Recent Labs    10/06/20 1844 12/08/20 0855  INR 1.1 1.0  APTT 35 31    BMP: Recent Labs    12/11/20 0536 12/13/20 0312 12/15/20 0359 12/22/20 0955 12/28/20 1301  NA 132* 132* 130* 133* 131*  K 3.9 4.5 4.3 4.1 4.2   CL 99 99 97* 92* 94*  CO2 25 25 24  34* 30  GLUCOSE 122* 147* 149* 86 178*  BUN 14 14 13 14 16   CALCIUM 9.7 9.5 9.2 9.5 9.9  CREATININE 0.58 0.66 0.50 0.43 0.66  GFRNONAA >60 >60 >60  --  >60    LIVER FUNCTION TESTS: Recent Labs  12/01/20 1537 12/08/20 0855 12/22/20 0955 12/28/20 1301  BILITOT 0.3 0.5 0.3 0.4  AST 30 28 21 18   ALT 37 39 53* 37  ALKPHOS 84 84 106 81  PROT 8.0 8.1 7.0 7.2  ALBUMIN 3.5 2.8* 3.1* 3.2*    TUMOR MARKERS: No results for input(s): AFPTM, CEA, CA199, CHROMGRNA in the last 8760 hours.  Assessment and Plan: History of hypercholesterolemia, large cell non-Hodgkin's lymphoma stage IV, hyperglycemia, melanoma, postoperative nausea and vomiting and urinary incontinence. Pt had lymph node biopsy 12/24/20 that resulted high-grade B-cell lymphoma. She had MR brain 12/13/20 that was showed multiple brain lesions concerning for metastatic disease.  Pt is referred here today by Dr. Pearletha Alfred for port-a-cath placement to start chemotherapy.   Pt resting quietly on stretcher.  She is A&O, calm and pleasant. She is in no distress. Pt states she is NPO per order.  No labs needed today.   Risks and benefits of image guided port-a-catheter placement was discussed with the patient including, but not limited to bleeding, infection, pneumothorax, or fibrin sheath development and need for additional procedures.  All of the patient's questions were answered, patient is agreeable to proceed. Consent signed and in chart.   Thank you for this interesting consult.  I greatly enjoyed meeting Merle Whitehorn and look forward to participating in their care.  A copy of this report was sent to the requesting provider on this date.  Electronically Signed: Tyson Alias, NP 12/30/2020, 9:14 AM   I spent a total of  30 minutes in face to face in clinical consultation, greater than 50% of which was counseling/coordinating care for port-a-cath placement.

## 2020-12-30 NOTE — Addendum Note (Signed)
Addended by: Pincus Large on: 12/30/2020 11:23 AM   Modules accepted: Orders

## 2020-12-31 ENCOUNTER — Ambulatory Visit: Payer: Medicare Other

## 2020-12-31 ENCOUNTER — Telehealth: Payer: Self-pay | Admitting: *Deleted

## 2020-12-31 DIAGNOSIS — G9341 Metabolic encephalopathy: Secondary | ICD-10-CM | POA: Diagnosis not present

## 2020-12-31 DIAGNOSIS — R4701 Aphasia: Secondary | ICD-10-CM | POA: Diagnosis not present

## 2020-12-31 DIAGNOSIS — D509 Iron deficiency anemia, unspecified: Secondary | ICD-10-CM | POA: Diagnosis not present

## 2020-12-31 DIAGNOSIS — E871 Hypo-osmolality and hyponatremia: Secondary | ICD-10-CM | POA: Diagnosis not present

## 2020-12-31 DIAGNOSIS — M47812 Spondylosis without myelopathy or radiculopathy, cervical region: Secondary | ICD-10-CM | POA: Diagnosis not present

## 2020-12-31 DIAGNOSIS — Z9181 History of falling: Secondary | ICD-10-CM | POA: Diagnosis not present

## 2020-12-31 DIAGNOSIS — E278 Other specified disorders of adrenal gland: Secondary | ICD-10-CM | POA: Diagnosis not present

## 2020-12-31 DIAGNOSIS — R590 Localized enlarged lymph nodes: Secondary | ICD-10-CM | POA: Diagnosis not present

## 2020-12-31 DIAGNOSIS — C7931 Secondary malignant neoplasm of brain: Secondary | ICD-10-CM | POA: Diagnosis not present

## 2020-12-31 DIAGNOSIS — I10 Essential (primary) hypertension: Secondary | ICD-10-CM | POA: Diagnosis not present

## 2020-12-31 DIAGNOSIS — D630 Anemia in neoplastic disease: Secondary | ICD-10-CM | POA: Diagnosis not present

## 2020-12-31 DIAGNOSIS — Z8673 Personal history of transient ischemic attack (TIA), and cerebral infarction without residual deficits: Secondary | ICD-10-CM | POA: Diagnosis not present

## 2020-12-31 DIAGNOSIS — E7800 Pure hypercholesterolemia, unspecified: Secondary | ICD-10-CM | POA: Diagnosis not present

## 2020-12-31 DIAGNOSIS — Z85820 Personal history of malignant melanoma of skin: Secondary | ICD-10-CM | POA: Diagnosis not present

## 2020-12-31 DIAGNOSIS — I6523 Occlusion and stenosis of bilateral carotid arteries: Secondary | ICD-10-CM | POA: Diagnosis not present

## 2020-12-31 DIAGNOSIS — Z7952 Long term (current) use of systemic steroids: Secondary | ICD-10-CM | POA: Diagnosis not present

## 2020-12-31 NOTE — Telephone Encounter (Signed)
Called and gave upcoming appointments - requested callback to confirm. 

## 2021-01-02 ENCOUNTER — Other Ambulatory Visit: Payer: Self-pay

## 2021-01-02 ENCOUNTER — Emergency Department (HOSPITAL_COMMUNITY): Payer: Medicare Other

## 2021-01-02 ENCOUNTER — Encounter (HOSPITAL_COMMUNITY): Payer: Self-pay | Admitting: Emergency Medicine

## 2021-01-02 ENCOUNTER — Emergency Department (HOSPITAL_COMMUNITY)
Admission: EM | Admit: 2021-01-02 | Discharge: 2021-01-02 | Disposition: A | Discharge status: Home / Self Care | Payer: Medicare Other | Attending: Emergency Medicine | Admitting: Emergency Medicine

## 2021-01-02 DIAGNOSIS — Z7952 Long term (current) use of systemic steroids: Secondary | ICD-10-CM | POA: Insufficient documentation

## 2021-01-02 DIAGNOSIS — G9389 Other specified disorders of brain: Secondary | ICD-10-CM | POA: Diagnosis not present

## 2021-01-02 DIAGNOSIS — D496 Neoplasm of unspecified behavior of brain: Secondary | ICD-10-CM

## 2021-01-02 DIAGNOSIS — C799 Secondary malignant neoplasm of unspecified site: Secondary | ICD-10-CM | POA: Diagnosis not present

## 2021-01-02 DIAGNOSIS — E1165 Type 2 diabetes mellitus with hyperglycemia: Secondary | ICD-10-CM | POA: Diagnosis not present

## 2021-01-02 DIAGNOSIS — Z20822 Contact with and (suspected) exposure to covid-19: Secondary | ICD-10-CM | POA: Diagnosis not present

## 2021-01-02 DIAGNOSIS — Z743 Need for continuous supervision: Secondary | ICD-10-CM | POA: Diagnosis not present

## 2021-01-02 DIAGNOSIS — C719 Malignant neoplasm of brain, unspecified: Secondary | ICD-10-CM | POA: Insufficient documentation

## 2021-01-02 DIAGNOSIS — G459 Transient cerebral ischemic attack, unspecified: Secondary | ICD-10-CM | POA: Diagnosis not present

## 2021-01-02 DIAGNOSIS — H547 Unspecified visual loss: Secondary | ICD-10-CM | POA: Diagnosis not present

## 2021-01-02 DIAGNOSIS — R410 Disorientation, unspecified: Secondary | ICD-10-CM | POA: Diagnosis not present

## 2021-01-02 DIAGNOSIS — R739 Hyperglycemia, unspecified: Secondary | ICD-10-CM

## 2021-01-02 DIAGNOSIS — E871 Hypo-osmolality and hyponatremia: Secondary | ICD-10-CM | POA: Insufficient documentation

## 2021-01-02 DIAGNOSIS — R251 Tremor, unspecified: Secondary | ICD-10-CM | POA: Diagnosis not present

## 2021-01-02 DIAGNOSIS — R7309 Other abnormal glucose: Secondary | ICD-10-CM | POA: Diagnosis not present

## 2021-01-02 DIAGNOSIS — G936 Cerebral edema: Secondary | ICD-10-CM | POA: Diagnosis not present

## 2021-01-02 DIAGNOSIS — Z79899 Other long term (current) drug therapy: Secondary | ICD-10-CM | POA: Insufficient documentation

## 2021-01-02 DIAGNOSIS — Z8673 Personal history of transient ischemic attack (TIA), and cerebral infarction without residual deficits: Secondary | ICD-10-CM | POA: Diagnosis not present

## 2021-01-02 LAB — URINALYSIS, ROUTINE W REFLEX MICROSCOPIC
Bacteria, UA: NONE SEEN
Bilirubin Urine: NEGATIVE
Glucose, UA: NEGATIVE mg/dL
Ketones, ur: NEGATIVE mg/dL
Nitrite: NEGATIVE
Protein, ur: NEGATIVE mg/dL
Specific Gravity, Urine: 1.005 (ref 1.005–1.030)
pH: 8 (ref 5.0–8.0)

## 2021-01-02 LAB — CBC
HCT: 33 % — ABNORMAL LOW (ref 36.0–46.0)
Hemoglobin: 10.3 g/dL — ABNORMAL LOW (ref 12.0–15.0)
MCH: 24.1 pg — ABNORMAL LOW (ref 26.0–34.0)
MCHC: 31.2 g/dL (ref 30.0–36.0)
MCV: 77.1 fL — ABNORMAL LOW (ref 80.0–100.0)
Platelets: 353 10*3/uL (ref 150–400)
RBC: 4.28 MIL/uL (ref 3.87–5.11)
RDW: 19.1 % — ABNORMAL HIGH (ref 11.5–15.5)
WBC: 10.1 10*3/uL (ref 4.0–10.5)
nRBC: 0 % (ref 0.0–0.2)

## 2021-01-02 LAB — APTT: aPTT: 32 seconds (ref 24–36)

## 2021-01-02 LAB — I-STAT CHEM 8, ED
BUN: 12 mg/dL (ref 8–23)
Calcium, Ion: 1.15 mmol/L (ref 1.15–1.40)
Chloride: 94 mmol/L — ABNORMAL LOW (ref 98–111)
Creatinine, Ser: 0.5 mg/dL (ref 0.44–1.00)
Glucose, Bld: 248 mg/dL — ABNORMAL HIGH (ref 70–99)
HCT: 33 % — ABNORMAL LOW (ref 36.0–46.0)
Hemoglobin: 11.2 g/dL — ABNORMAL LOW (ref 12.0–15.0)
Potassium: 4 mmol/L (ref 3.5–5.1)
Sodium: 128 mmol/L — ABNORMAL LOW (ref 135–145)
TCO2: 27 mmol/L (ref 22–32)

## 2021-01-02 LAB — COMPREHENSIVE METABOLIC PANEL
ALT: 35 U/L (ref 0–44)
AST: 26 U/L (ref 15–41)
Albumin: 2.1 g/dL — ABNORMAL LOW (ref 3.5–5.0)
Alkaline Phosphatase: 77 U/L (ref 38–126)
Anion gap: 9 (ref 5–15)
BUN: 11 mg/dL (ref 8–23)
CO2: 25 mmol/L (ref 22–32)
Calcium: 8.9 mg/dL (ref 8.9–10.3)
Chloride: 94 mmol/L — ABNORMAL LOW (ref 98–111)
Creatinine, Ser: 0.58 mg/dL (ref 0.44–1.00)
GFR, Estimated: >60 mL/min (ref >60)
Glucose, Bld: 241 mg/dL — ABNORMAL HIGH (ref 70–99)
Potassium: 4.2 mmol/L (ref 3.5–5.1)
Sodium: 128 mmol/L — ABNORMAL LOW (ref 135–145)
Total Bilirubin: 0.5 mg/dL (ref 0.3–1.2)
Total Protein: 6.7 g/dL (ref 6.5–8.1)

## 2021-01-02 LAB — DIFFERENTIAL
Abs Immature Granulocytes: 0.06 10*3/uL (ref 0.00–0.07)
Basophils Absolute: 0 10*3/uL (ref 0.0–0.1)
Basophils Relative: 0 %
Eosinophils Absolute: 0 10*3/uL (ref 0.0–0.5)
Eosinophils Relative: 0 %
Immature Granulocytes: 1 %
Lymphocytes Relative: 8 %
Lymphs Abs: 0.8 10*3/uL (ref 0.7–4.0)
Monocytes Absolute: 0.3 10*3/uL (ref 0.1–1.0)
Monocytes Relative: 3 %
Neutro Abs: 8.9 10*3/uL — ABNORMAL HIGH (ref 1.7–7.7)
Neutrophils Relative %: 88 %

## 2021-01-02 LAB — RESP PANEL BY RT-PCR (FLU A&B, COVID) ARPGX2
Influenza A by PCR: NEGATIVE
Influenza B by PCR: NEGATIVE
SARS Coronavirus 2 by RT PCR: NEGATIVE

## 2021-01-02 LAB — RAPID URINE DRUG SCREEN, HOSP PERFORMED
Amphetamines: NOT DETECTED
Barbiturates: NOT DETECTED
Benzodiazepines: NOT DETECTED
Cocaine: NOT DETECTED
Opiates: NOT DETECTED
Tetrahydrocannabinol: NOT DETECTED

## 2021-01-02 LAB — ETHANOL: Alcohol, Ethyl (B): <10 mg/dL (ref <10)

## 2021-01-02 LAB — PROTIME-INR
INR: 1.1 (ref 0.8–1.2)
Prothrombin Time: 14.2 seconds (ref 11.4–15.2)

## 2021-01-02 IMAGING — CT CT HEAD W/O CM
3 series · 16 of 47 positions shown, 19 images · non-contrast
Comparison: [DATE]

CLINICAL DATA: Transient ischemic attack.

EXAM:
CT HEAD WITHOUT CONTRAST
TECHNIQUE: Contiguous axial images were obtained from the base of the skull
through the vertex without intravenous contrast.

[Series 4: head 5.0 h30s · axial · 0.42mm/px · z∈[-94,+31]mm · 10 of 31 slices shown, 13 images]
[im 3/31  brain]
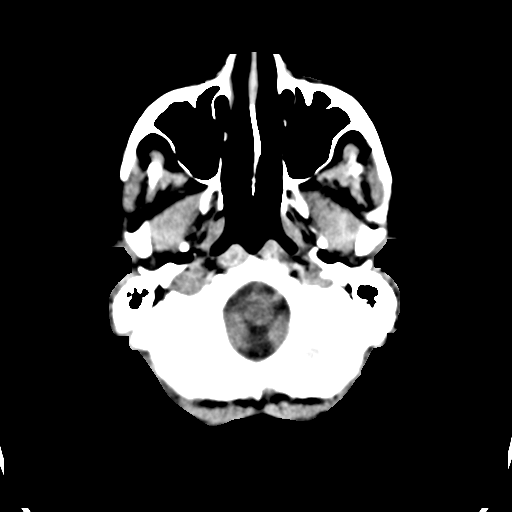
[im 3/31  bone]
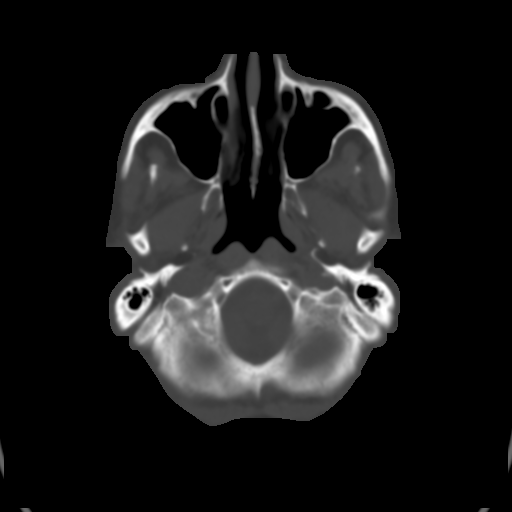
[im 6/31  brain]
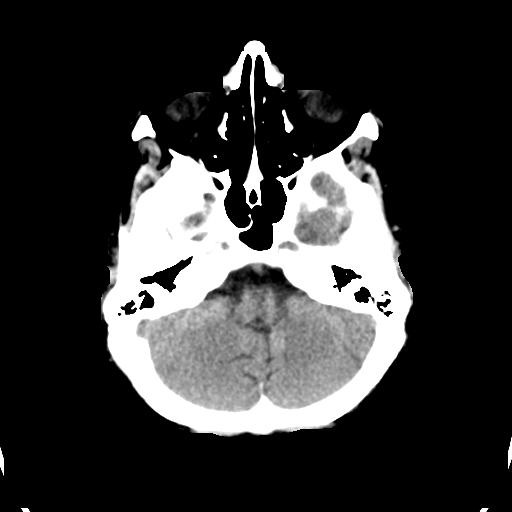
[im 9/31  brain]
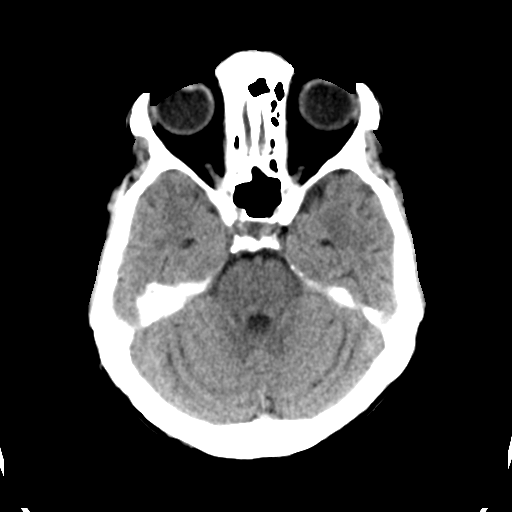
[im 11/31  brain]
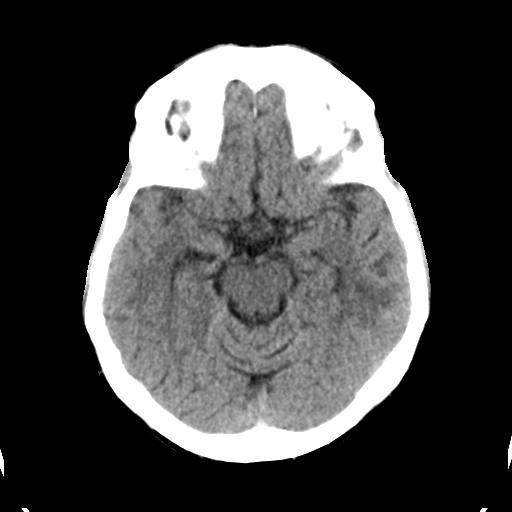
[im 14/31  brain]
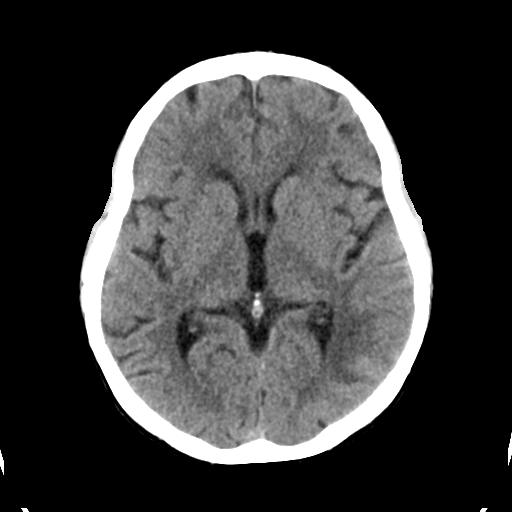
[im 14/31  bone]
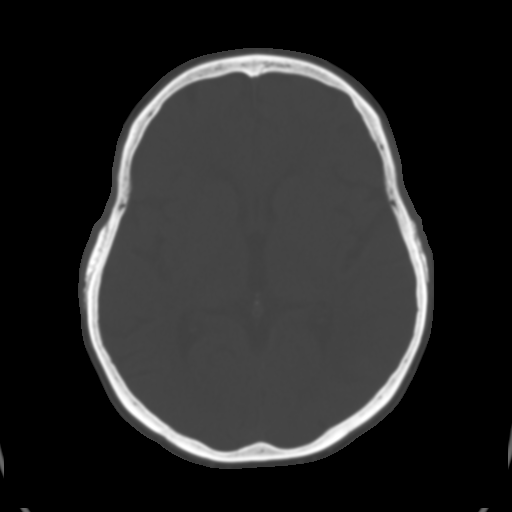
[im 17/31  brain]
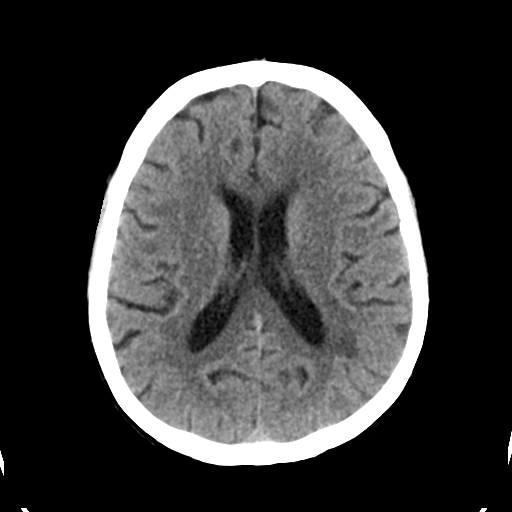
[im 20/31  brain]
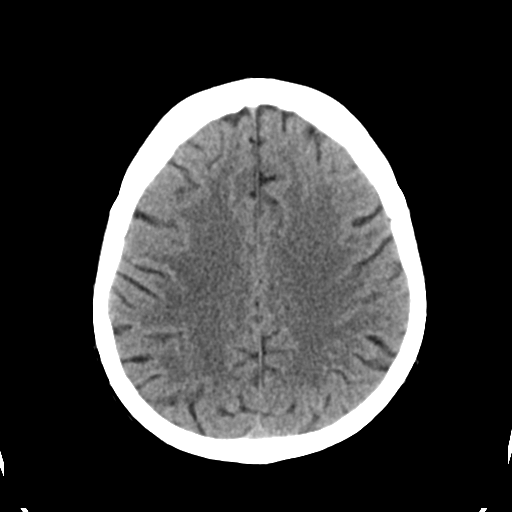
[im 23/31  brain]
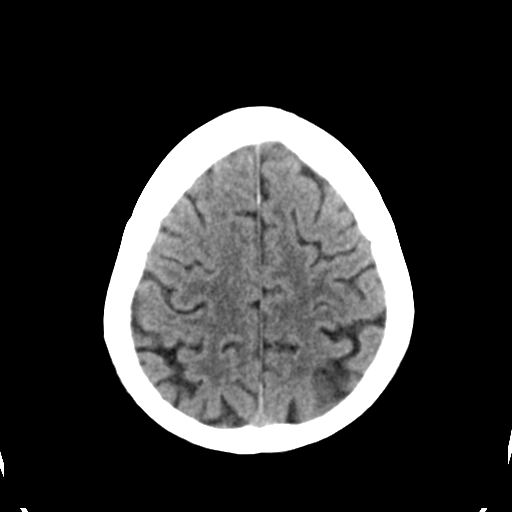
[im 25/31  brain]
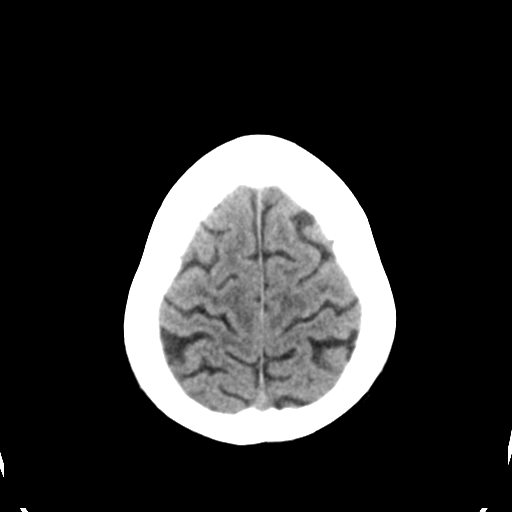
[im 25/31  bone]
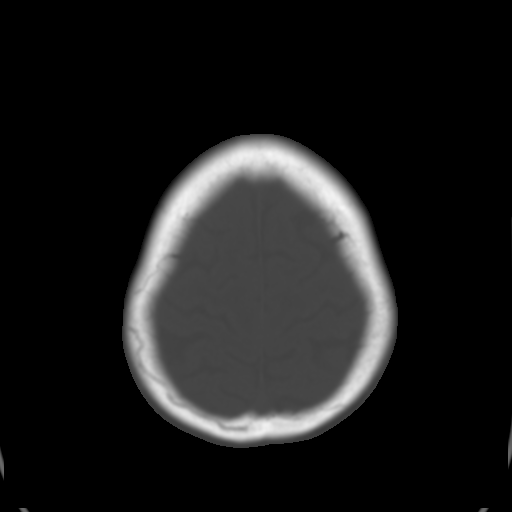
[im 28/31  brain]
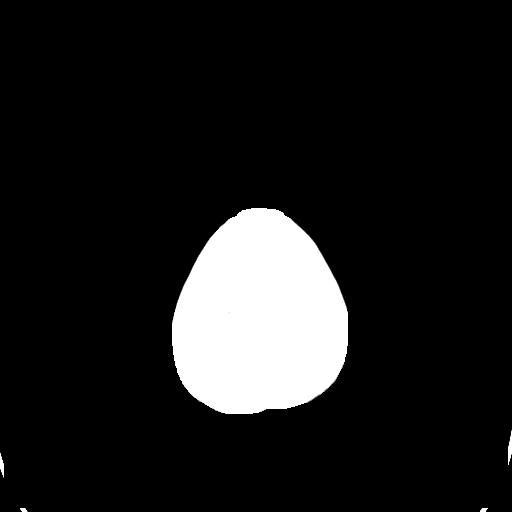

[Series 5: head 3.0 mpr cor · coronal · 0.30mm/px · 3 of 67 slices shown]
[im 23/67  brain]
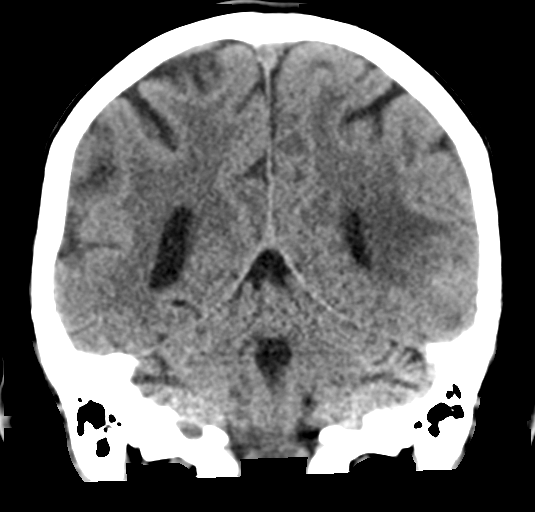
[im 30/67  brain]
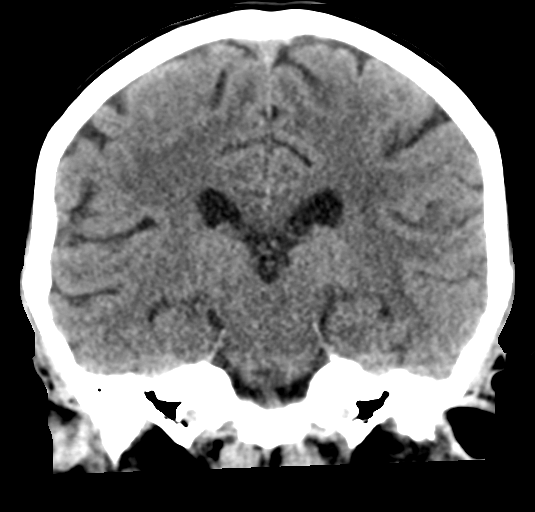
[im 37/67  brain]
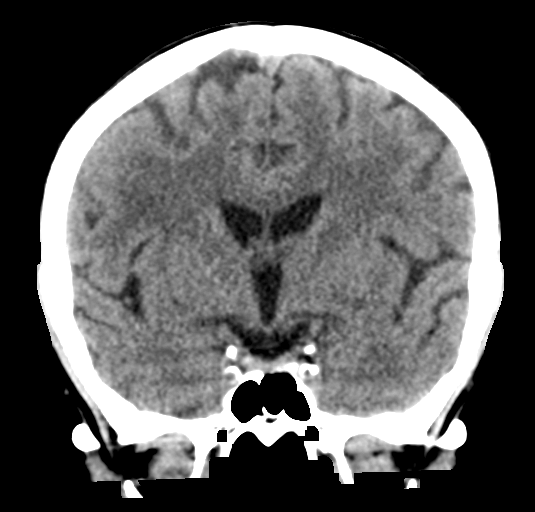

[Series 6: head 3.0 mpr sag · sagittal · 0.31mm/px · 3 of 56 slices shown]
[im 19/56  brain]
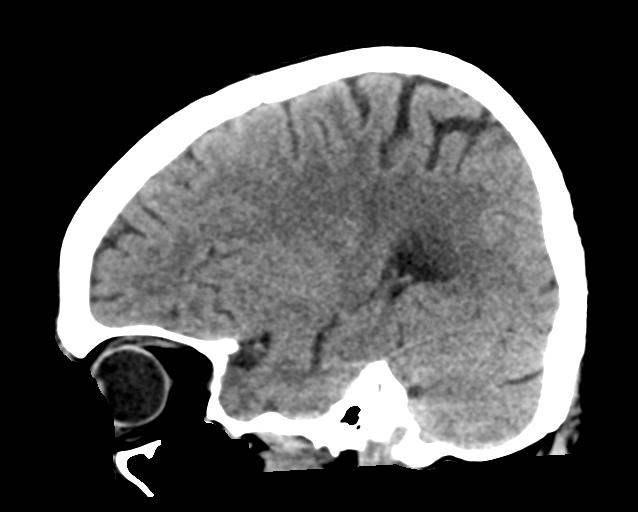
[im 28/56  brain]
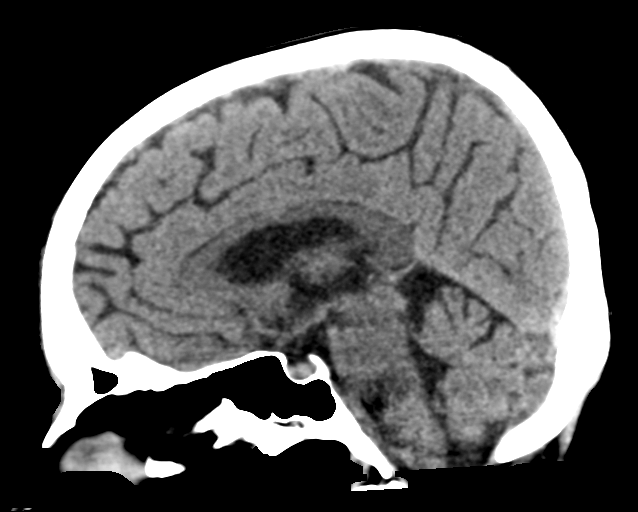
[im 37/56  brain]
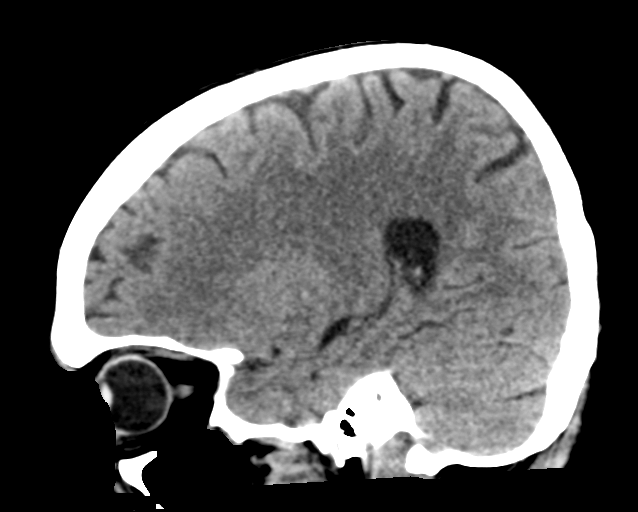

[16 of 47 positions shown; findings below may reference images not displayed]

FINDINGS: Brain: No evidence of acute infarction, hemorrhage, hydrocephalus,
or extra-axial collection. Again seen are areas of hypoattenuation
in the left temporal and parietal lobes, which correspond to
metastatic lesions with vasogenic edema, in correlation with prior
MRI.

Vascular: No hyperdense vessel or unexpected calcification.

Skull: Normal. Negative for fracture or focal lesion.

Sinuses/Orbits: No acute finding.

Other: None.
IMPRESSION: 1. No acute intracranial abnormality.
2. Stable areas of hypoattenuation in the left temporal and parietal
lobes, which correspond to metastatic lesions with vasogenic edema,
in correlation with prior MRI.

## 2021-01-02 MED ORDER — GADOBUTROL 1 MMOL/ML IV SOLN
6.4000 mL | Freq: Once | INTRAVENOUS | Status: AC | PRN
  Administered 2021-01-02: 6.4 mL via INTRAVENOUS

## 2021-01-02 MED ORDER — LEVETIRACETAM IN NACL 500 MG/100ML IV SOLN
500.0000 mg | Freq: Once | INTRAVENOUS | Status: AC
  Administered 2021-01-02: 500 mg via INTRAVENOUS
  Filled 2021-01-02 (×2): qty 100

## 2021-01-02 MED ORDER — DEXAMETHASONE SODIUM PHOSPHATE 4 MG/ML IJ SOLN
4.0000 mg | Freq: Once | INTRAMUSCULAR | Status: AC
  Administered 2021-01-02: 4 mg via INTRAVENOUS
  Filled 2021-01-02: qty 1

## 2021-01-02 MED ORDER — SODIUM CHLORIDE 0.9 % IV BOLUS
500.0000 mL | Freq: Once | INTRAVENOUS | Status: AC
  Administered 2021-01-02: 500 mL via INTRAVENOUS

## 2021-01-02 NOTE — ED Provider Notes (Signed)
[facility] Provider Note   CSN: [id number] Arrival date & time: 01/02/21  1400     History No chief complaint on file.   Linsie Lupo is a 65 y.o. female.  Patient is a 65 year old female who presents with possible TIA symptoms.  She has a complicated recent medical history.  She has a history of a TIA in August.  She had recurrence of symptoms last month with some confusion, speech difficulties and aphasia.  She was diagnosed with metastatic brain lesions on MRI.  She had a lymph node biopsy on October 28 which showed a high-grade B-cell lymphoma.  She has a port placed and is going to be starting chemotherapy on Tuesday.  She was started on Keppra and Decadron during this hospitalization.  The son is at bedside and said he has noted some cognitive delays over the last 2 to 3 days.  She is had some confusion and answered some questions inappropriately.  Today she was standing in a sink and brushing her teeth.  She was not able to turn the water off although she does not really know why.  She was aware of everything that was going on and she was able to talk.  Although her son says she called out for a family member who is in another state.  He states that she was trembling in her hands bilaterally.  There is no generalized seizure activity.  This episode lasted for about 5 minutes and she is back to baseline currently.  She denies any headaches.  No nausea or vomiting.  No fevers or other recent illnesses.  She recently had vitreous hemorrhage in her right eye and underwent surgery just prior to this last admission.  She has some vision loss in that eye related to that but otherwise does not notice any change in her vision.      Past Medical History:  Diagnosis Date   Chicken pox    Complication of anesthesia    Per patient, very slow to wake up after anesthesia   Goals of care, counseling/discussion 12/22/2020   High cholesterol    High grade B-cell  lymphoma ([place]) 12/22/2020   Hyperglycemia 11/10/2020   Melanoma ([place]) 2021   right hip   PONV (postoperative nausea and vomiting)    Urinary incontinence     Patient Active Problem List   Diagnosis Date Noted   High grade B-cell lymphoma ([place]) 12/22/2020   Goals of care, counseling/discussion 27/78/2423   Acute metabolic encephalopathy 53/61/4431   Brain metastasis ([place]) 12/09/2020   Brain tumor ([place]) 12/08/2020   Hyponatremia 12/08/2020   Night sweat 11/30/2020   Chicken pox 54/00/8676   Complication of anesthesia 11/22/2020   PONV (postoperative nausea and vomiting) 11/22/2020   Skin rash 11/10/2020   Tachycardia 11/10/2020   Anemia 11/10/2020   Hyperglycemia 11/10/2020   TIA (transient ischemic attack) 10/07/2020   Iron deficiency anemia 10/07/2020   Aphasia 10/06/2020   Melanoma ([place]) 2021   Multinodular goiter 12/22/2013   Subclinical hyperthyroidism 12/19/2013   Urinary incontinence 11/19/2013   Hyperlipidemia 11/19/2012   Routine general medical examination at a health care facility 11/18/2012    Past Surgical History:  Procedure Laterality Date   AIR/FLUID EXCHANGE Right 12/07/2020   Procedure: AIR/FLUID EXCHANGE;  Surgeon: Jalene Mullet, MD;  Location: [facility];  Service: Ophthalmology;  Laterality: Right;   [place]   BIOPSY  12/14/2020   Procedure: BIOPSY;  Surgeon: Irving Copas., MD;  [facility];  Service: Gastroenterology;;   ESOPHAGOGASTRODUODENOSCOPY (EGD) WITH PROPOFOL N/A 12/14/2020   Procedure: ESOPHAGOGASTRODUODENOSCOPY (EGD) WITH PROPOFOL;  Surgeon: Rush Landmark Telford Nab., MD;  Location: [facility];  Service: Gastroenterology;  Laterality: N/A;   EXCISION MELANOMA WITH SENTINEL LYMPH NODE BIOPSY Right 10/09/2019   Procedure: WIDE LOCAL EXCISION WITH ADVANCEMENT FLAP CLOSURE RIGHT HIP MELANOMA WITH SENTINEL LYMPH NODE BIOPSY;  Surgeon: Stark Klein, MD;  Location: [facility];  Service: General;  Laterality: Right;   FINE  NEEDLE ASPIRATION  12/14/2020   Procedure: FINE NEEDLE ASPIRATION (FNA) LINEAR;  Surgeon: Irving Copas., MD;  Location: [facility];  Service: Gastroenterology;;   HERNIA REPAIR  2009   IR IMAGING GUIDED PORT INSERTION  12/30/2020   MOUTH SURGERY  11/2015   gum surgery and bone implant   PARS PLANA VITRECTOMY Right 12/07/2020   Procedure: PARS PLANA VITRECTOMY WITH 25 GAUGE;  Surgeon: Jalene Mullet, MD;  Location: [facility];  Service: Ophthalmology;  Laterality: Right;   PHOTOCOAGULATION WITH LASER Right 12/07/2020   Procedure: PHOTOCOAGULATION WITH ENDOLASER PANRITINAL COAGULATION;  Surgeon: Jalene Mullet, MD;  Location: [facility];  Service: Ophthalmology;  Laterality: Right;   UPPER ESOPHAGEAL ENDOSCOPIC ULTRASOUND (EUS) N/A 12/14/2020   Procedure: UPPER ESOPHAGEAL ENDOSCOPIC ULTRASOUND (EUS);  Surgeon: Irving Copas., MD;  Location: [facility];  Service: Gastroenterology;  Laterality: N/A;   WISDOM TOOTH EXTRACTION       OB History     Gravida  2   Para  2   Term      Preterm      AB      Living  2      SAB      IAB      Ectopic      Multiple      Live Births              Family History  Problem Relation Age of Onset   Coronary artery disease Father        died at 57   COPD Father    Alcohol abuse Father    Diabetes Mellitus II Father    Coronary artery disease Sister    Cancer Sister        breast   Heart attack Brother    Hyperlipidemia Other        died 38- "natural causes"   Hyperlipidemia Other        4 siblings    Hypertension Other        3 siblings    Social History   Tobacco Use   Smoking status: Never   Smokeless tobacco: Never  Vaping Use   Vaping Use: Never used  Substance Use Topics   Alcohol use: Never   Drug use: Never    Home Medications Prior to Admission medications   Medication Sig Start Date End Date Taking? Authorizing Provider  allopurinol (ZYLOPRIM) 100 MG tablet Take 1 tablet (100 mg total) by  mouth daily. Start taking for 1 month only on 01/01/2021 12/28/20   Volanda Napoleon, MD  dexamethasone (DECADRON) 4 MG tablet Take 1 tablet (4 mg total) by mouth 2 (two) times daily. 12/16/20 02/14/21  Charlynne Cousins, MD  famciclovir (FAMVIR) 250 MG tablet Take 1 tablet (250 mg total) by mouth daily. 12/28/20   Volanda Napoleon, MD  levETIRAcetam (KEPPRA) 500 MG tablet Take 1 tablet (500 mg total) by mouth 2 (two) times daily. 12/16/20 03/16/21  Charlynne Cousins, MD  Allergies    Codeine and Plavix [clopidogrel]  Review of Systems   Review of Systems  Constitutional:  Negative for chills, diaphoresis, fatigue and fever.  HENT:  Negative for congestion, rhinorrhea and sneezing.   Eyes:  Positive for visual disturbance.  Respiratory:  Negative for cough, chest tightness and shortness of breath.   Cardiovascular:  Negative for chest pain and leg swelling.  Gastrointestinal:  Negative for abdominal pain, blood in stool, diarrhea, nausea and vomiting.  Genitourinary:  Negative for difficulty urinating, flank pain, frequency and hematuria.  Musculoskeletal:  Negative for arthralgias and back pain.  Skin:  Negative for rash.  Neurological:  Positive for speech difficulty. Negative for dizziness, weakness, numbness and headaches.  Psychiatric/Behavioral:  Positive for confusion.    Physical Exam Updated Vital Signs BP 116/67   Pulse 66   Temp 98.5 F (36.9 C) (Oral)   Resp 14   SpO2 99%   Physical Exam Constitutional:      Appearance: She is well-developed.  HENT:     Head: Normocephalic and atraumatic.  Eyes:     Pupils: Pupils are equal, round, and reactive to light.  Cardiovascular:     Rate and Rhythm: Normal rate and regular rhythm.     Heart sounds: Normal heart sounds.  Pulmonary:     Effort: Pulmonary effort is normal. No respiratory distress.     Breath sounds: Normal breath sounds. No wheezing or rales.  Chest:     Chest wall: No tenderness.  Abdominal:      General: Bowel sounds are normal.     Palpations: Abdomen is soft.     Tenderness: There is no abdominal tenderness. There is no guarding or rebound.  Musculoskeletal:        General: Normal range of motion.     Cervical back: Normal range of motion and neck supple.  Lymphadenopathy:     Cervical: No cervical adenopathy.  Skin:    General: Skin is warm and dry.     Findings: No rash.  Neurological:     Mental Status: She is alert and oriented to person, place, and time.     Comments: Motor 5/5 all extremities Sensation grossly intact to LT all extremities Finger to Nose intact, no pronator drift CN II-XII grossly intact, other than vision loss in her right eye      ED Results / Procedures / Treatments   Labs (all labs ordered are listed, but only abnormal results are displayed) Labs Reviewed  CBC - Abnormal; Notable for the following components:      Result Value   Hemoglobin 10.3 (*)    HCT 33.0 (*)    MCV 77.1 (*)    MCH 24.1 (*)    RDW 19.1 (*)    All other components within normal limits  DIFFERENTIAL - Abnormal; Notable for the following components:   Neutro Abs 8.9 (*)    All other components within normal limits  COMPREHENSIVE METABOLIC PANEL - Abnormal; Notable for the following components:   Sodium 128 (*)    Chloride 94 (*)    Glucose, Bld 241 (*)    Albumin 2.1 (*)    All other components within normal limits  URINALYSIS, ROUTINE W REFLEX MICROSCOPIC - Abnormal; Notable for the following components:   Color, Urine STRAW (*)    Hgb urine dipstick SMALL (*)    Leukocytes,Ua MODERATE (*)    All other components within normal limits  I-STAT CHEM 8, ED - Abnormal; Notable  for the following components:   Sodium 128 (*)    Chloride 94 (*)    Glucose, Bld 248 (*)    Hemoglobin 11.2 (*)    HCT 33.0 (*)    All other components within normal limits  RESP PANEL BY RT-PCR (FLU A&B, COVID) ARPGX2  ETHANOL  PROTIME-INR  APTT  RAPID URINE DRUG SCREEN, HOSP  PERFORMED    EKG None  Radiology CT HEAD WO CONTRAST  Result Date: 01/02/2021 CLINICAL DATA:  Transient ischemic attack. EXAM: CT HEAD WITHOUT CONTRAST TECHNIQUE: Contiguous axial images were obtained from the base of the skull through the vertex without intravenous contrast. COMPARISON:  December 08, 2020 FINDINGS: Brain: No evidence of acute infarction, hemorrhage, hydrocephalus, or extra-axial collection. Again seen are areas of hypoattenuation in the left temporal and parietal lobes, which correspond to metastatic lesions with vasogenic edema, in correlation with prior MRI. Vascular: No hyperdense vessel or unexpected calcification. Skull: Normal. Negative for fracture or focal lesion. Sinuses/Orbits: No acute finding. Other: None. IMPRESSION: 1. No acute intracranial abnormality. 2. Stable areas of hypoattenuation in the left temporal and parietal lobes, which correspond to metastatic lesions with vasogenic edema, in correlation with prior MRI. Electronically Signed   By: Fidela Salisbury M.D.   On: 01/02/2021 15:57   MR Brain W and Wo Contrast  Result Date: 01/02/2021 CLINICAL DATA:  Metastatic disease evaluation EXAM: MRI HEAD WITHOUT AND WITH CONTRAST TECHNIQUE: Multiplanar, multiecho pulse sequences of the brain and surrounding structures were obtained without and with intravenous contrast. CONTRAST:  6.27mL GADAVIST GADOBUTROL 1 MMOL/ML IV SOLN COMPARISON:  12/08/2020 FINDINGS: Brain: No acute infarct, mass effect or extra-axial collection. No acute or chronic hemorrhage. There is multifocal hyperintense T2-weighted signal within the white matter. Parenchymal volume and CSF spaces are normal. Large contrast enhancing lesion in the left temporal lobe now measures 2.7 x 2.1 cm, previously 2.0 x 2.0 cm. There is mild surrounding edema. A second lesion, in the left parietal lobe is unchanged and measures 7 mm. There are no new lesions. The midline structures are normal. Vascular: Major flow voids  are preserved. Skull and upper cervical spine: Normal calvarium and skull base. Visualized upper cervical spine and soft tissues are normal. Sinuses/Orbits:No paranasal sinus fluid levels or advanced mucosal thickening. No mastoid or middle ear effusion. Normal orbits. IMPRESSION: 1. Slight increase in size of left temporal lobe lesion with mild surrounding edema. 2. Unchanged size of left parietal lobe lesion. 3. No new lesions. Electronically Signed   By: Ulyses Jarred M.D.   On: 01/02/2021 19:58    Procedures Procedures   Medications Ordered in ED Medications  sodium chloride 0.9 % bolus 500 mL (500 mLs Intravenous New Bag/Given 01/02/21 2024)  levETIRAcetam (KEPPRA) IVPB 500 mg/100 mL premix (0 mg Intravenous Stopped 01/02/21 2056)  dexamethasone (DECADRON) injection 4 mg (4 mg Intravenous Given 01/02/21 1852)  gadobutrol (GADAVIST) 1 MMOL/ML injection 6.4 mL (6.4 mLs Intravenous Contrast Given 01/02/21 1933)    ED Course  I have reviewed the triage vital signs and the nursing notes.  Pertinent labs & imaging results that were available during my care of the patient were reviewed by me and considered in my medical decision making (see chart for details).    MDM Rules/Calculators/A&P                           Patient is a 65 year old female who presents with some increased confusion over the last couple  days and an episode today where she was not responding for a short period of time.  She does remember the whole event.  She did not have any witnessed seizure activity.  Her brain imaging shows a slightly increased area of her temporal lobe lesion.  There is no new lesions.  No evidence of strokes.  I spoke with Dr. Kathyrn Sheriff with neurosurgery.  He did not feel that there is anything to add to the treatment at this point.  The mainstay would be starting the chemotherapy which she is supposed to start in 2 days.  She will continue the Keppra and the Decadron.  Her sodium is a little bit low.  She  was given normal saline in the ED.  She also had an elevated blood glucose.  This may be resulting from her steroid use.  I advised her of these and that she needs to follow-up with her doctor regarding this.  Strict return precautions were given. Final Clinical Impression(s) / ED Diagnoses Final diagnoses:  Brain tumor ([place])  Hyponatremia  Elevated blood sugar    Rx / DC Orders ED Discharge Orders     None        Malvin Johns, MD 01/02/21 2131

## 2021-01-02 NOTE — Discharge Instructions (Addendum)
Follow-up with your oncologist tomorrow.  Your blood sugar was elevated.  This may be due to the steroids that you are taking.  However this needs to be followed by your doctor.  Your salt level was a little bit low and this needs to be rechecked by your doctor.  Return to the emergency room if you have any worsening symptoms.

## 2021-01-02 NOTE — ED Provider Notes (Signed)
Emergency Medicine Provider Triage Evaluation Note  Ariel [name] , a 65 y.o. female  was evaluated in triage.  Patient reports that she was about to wash her hands after using the restroom.  She had turned the water on and had washed her hands however when she attempted to turn the water off she was unable to do so.  She states that she was standing there staring at the sink knowing that she needed to turn the water off however for whatever reason could not bring herself to do so.  She called out for her son and was able to communicate effectively without any signs of aphasia or slurring of speech.  She does have a history of TIAs in the past and states a similar thing happened in August.  Son called 911 and by the time they got there her symptoms had resolved.  They lasted approximately 10 minutes.  Currently she feels fine.  She does mention that she was recently diagnosed with cancer and plans to start treatment on Tuesday.  Review of Systems  Positive: + brief inability to perform tasks Negative: - speech changes, unilateral weakness or numbness, headache  Physical Exam  BP (!) 123/59 (BP Location: Left Arm)   Pulse 74   Temp 98.3 F (36.8 C) (Oral)   Resp 15   SpO2 99%  Gen:   Awake, no distress   Resp:  Normal effort  MSK:   Moves extremities without difficulty  Other:  CN's intact. Strength 5/5 to BUE and BLEs. A&O x 4. No drift.   Medical Decision Making  Medically screening exam initiated at 2:30 PM.  Appropriate orders placed.  Anissia Wessells was informed that the remainder of the evaluation will be completed by another provider, this initial triage assessment does not replace that evaluation, and the importance of remaining in the ED until their evaluation is complete.  Concern for TIA    Eustaquio Maize, PA-C 01/02/21 1432    Malvin Johns, MD 01/02/21 1445

## 2021-01-02 NOTE — ED Triage Notes (Addendum)
Pt to triage via [facility] from home.  Reports TIAs since August.    Diagnosed with brain cancer in October and has slow cognition.  Went to bathroom today and was unable to turn water off after she washed hands and knew what she needed to do but could not process how to turn water off.    Symptoms lasted approx 10 min.  On EMS arrival symptoms had resolved. No arm drift.  Speech clear.  VAN negative.  EMS- 18g RAC

## 2021-01-02 NOTE — ED Notes (Signed)
Patient transported to MRI 

## 2021-01-03 ENCOUNTER — Encounter: Payer: Self-pay | Admitting: *Deleted

## 2021-01-03 ENCOUNTER — Encounter (HOSPITAL_COMMUNITY): Payer: Medicare Other

## 2021-01-03 ENCOUNTER — Other Ambulatory Visit: Payer: Self-pay | Admitting: *Deleted

## 2021-01-03 DIAGNOSIS — C8590 Non-Hodgkin lymphoma, unspecified, unspecified site: Secondary | ICD-10-CM

## 2021-01-03 DIAGNOSIS — C8510 Unspecified B-cell lymphoma, unspecified site: Secondary | ICD-10-CM

## 2021-01-03 MED ORDER — DEXAMETHASONE 4 MG PO TABS: 4.0000 mg | ORAL_TABLET | Freq: Three times a day (TID) | ORAL | 0 refills | Status: DC

## 2021-01-03 MED ORDER — ONDANSETRON HCL 8 MG PO TABS: 8.0000 mg | ORAL_TABLET | Freq: Two times a day (BID) | ORAL | 1 refills | Status: DC | PRN

## 2021-01-03 MED ORDER — PROCHLORPERAZINE MALEATE 10 MG PO TABS: 10.0000 mg | ORAL_TABLET | Freq: Four times a day (QID) | ORAL | 1 refills | Status: DC | PRN

## 2021-01-03 MED ORDER — DEXAMETHASONE 4 MG PO TABS: 8.0000 mg | ORAL_TABLET | Freq: Every day | ORAL | 1 refills | Status: DC

## 2021-01-03 MED ORDER — LIDOCAINE-PRILOCAINE 2.5-2.5 % EX CREA: TOPICAL_CREAM | CUTANEOUS | 3 refills | Status: DC

## 2021-01-03 NOTE — Progress Notes (Signed)
Received a call from the patient's daughter informing office that patient was seen in the ED over the weekend with increased confusion. The ED mentioned that the brain lesions had grown. Ariel [name] just wanted to make sure Ariel [name] was up to date on the visit. Patient is currently scheduled to start chemo tomorrow.   Reviewed visit and MRI with Ariel [name]. He stated that the lesions were only slightly increased, but the edema surrounding the lesions had grown. He would like the patient to increase her decadron to TID.   Called Ariel [name] back and reviewed with her Ariel [name] assessment and the need to increase the decadron to TID. Ariel [name] said she would call the patient's caregiver and give the new medication directions. Reviewed patient's chemo schedule for this week. Ariel [name] will call if they have any questions.   Oncology Nurse Navigator Documentation  Oncology Nurse Navigator Flowsheets 01/03/2021  Abnormal Finding Date -  Confirmed Diagnosis Date -  Phase of Treatment Chemo  Navigator Follow Up Date: 01/04/2021  Navigator Follow Up Reason: Chemo Class;Chemotherapy  Ariel [name], Ariel [name] Encounter Type Telephone  Telephone Appt Confirmation/Clarification;Diagnostic Results;Education;Incoming Call  Patient Visit Type MedOnc  Treatment Phase Active Tx  Barriers/Navigation Needs Coordination of Care;Education  Education Other  Interventions Education;Medication Assistance;Psycho-Social Support  Acuity Level 2-Minimal Needs (1-2 Barriers Identified)  Coordination of Care -  Education Method Verbal;Teach-back  Support Groups/Services Friends and Family  Time Spent with Patient 30

## 2021-01-04 ENCOUNTER — Telehealth: Payer: Self-pay | Admitting: Family

## 2021-01-04 ENCOUNTER — Encounter: Payer: Self-pay | Admitting: *Deleted

## 2021-01-04 ENCOUNTER — Inpatient Hospital Stay: Payer: Medicare Other

## 2021-01-04 ENCOUNTER — Ambulatory Visit: Payer: Medicare Other

## 2021-01-04 ENCOUNTER — Other Ambulatory Visit: Payer: Self-pay

## 2021-01-04 VITALS — BP 133/62 | HR 76 | Temp 98.2°F | Resp 18

## 2021-01-04 DIAGNOSIS — Z5111 Encounter for antineoplastic chemotherapy: Secondary | ICD-10-CM | POA: Diagnosis not present

## 2021-01-04 DIAGNOSIS — R42 Dizziness and giddiness: Secondary | ICD-10-CM | POA: Diagnosis not present

## 2021-01-04 DIAGNOSIS — C8510 Unspecified B-cell lymphoma, unspecified site: Secondary | ICD-10-CM

## 2021-01-04 DIAGNOSIS — Z794 Long term (current) use of insulin: Secondary | ICD-10-CM | POA: Diagnosis not present

## 2021-01-04 DIAGNOSIS — Z8639 Personal history of other endocrine, nutritional and metabolic disease: Secondary | ICD-10-CM | POA: Diagnosis not present

## 2021-01-04 DIAGNOSIS — Z7952 Long term (current) use of systemic steroids: Secondary | ICD-10-CM | POA: Diagnosis not present

## 2021-01-04 DIAGNOSIS — D509 Iron deficiency anemia, unspecified: Secondary | ICD-10-CM | POA: Diagnosis not present

## 2021-01-04 DIAGNOSIS — C8338 Diffuse large B-cell lymphoma, lymph nodes of multiple sites: Secondary | ICD-10-CM | POA: Diagnosis not present

## 2021-01-04 DIAGNOSIS — Z79899 Other long term (current) drug therapy: Secondary | ICD-10-CM | POA: Diagnosis not present

## 2021-01-04 DIAGNOSIS — Z85820 Personal history of malignant melanoma of skin: Secondary | ICD-10-CM | POA: Diagnosis not present

## 2021-01-04 DIAGNOSIS — C7931 Secondary malignant neoplasm of brain: Secondary | ICD-10-CM | POA: Diagnosis not present

## 2021-01-04 LAB — CMP (CANCER CENTER ONLY)
ALT: 33 U/L (ref 0–44)
AST: 19 U/L (ref 15–41)
Albumin: 2.9 g/dL — ABNORMAL LOW (ref 3.5–5.0)
Alkaline Phosphatase: 70 U/L (ref 38–126)
Anion gap: 8 (ref 5–15)
BUN: 12 mg/dL (ref 8–23)
CO2: 28 mmol/L (ref 22–32)
Calcium: 9.6 mg/dL (ref 8.9–10.3)
Chloride: 94 mmol/L — ABNORMAL LOW (ref 98–111)
Creatinine: 0.44 mg/dL (ref 0.44–1.00)
GFR, Estimated: >60 mL/min (ref >60)
Glucose, Bld: 163 mg/dL — ABNORMAL HIGH (ref 70–99)
Potassium: 4 mmol/L (ref 3.5–5.1)
Sodium: 130 mmol/L — ABNORMAL LOW (ref 135–145)
Total Bilirubin: 0.3 mg/dL (ref 0.3–1.2)
Total Protein: 6.7 g/dL (ref 6.5–8.1)

## 2021-01-04 LAB — CBC WITH DIFFERENTIAL (CANCER CENTER ONLY)
Abs Immature Granulocytes: 0.27 10*3/uL — ABNORMAL HIGH (ref 0.00–0.07)
Basophils Absolute: 0 10*3/uL (ref 0.0–0.1)
Basophils Relative: 0 %
Eosinophils Absolute: 0 10*3/uL (ref 0.0–0.5)
Eosinophils Relative: 0 %
HCT: 31.5 % — ABNORMAL LOW (ref 36.0–46.0)
Hemoglobin: 10.1 g/dL — ABNORMAL LOW (ref 12.0–15.0)
Immature Granulocytes: 2 %
Lymphocytes Relative: 5 %
Lymphs Abs: 0.6 10*3/uL — ABNORMAL LOW (ref 0.7–4.0)
MCH: 24.5 pg — ABNORMAL LOW (ref 26.0–34.0)
MCHC: 32.1 g/dL (ref 30.0–36.0)
MCV: 76.3 fL — ABNORMAL LOW (ref 80.0–100.0)
Monocytes Absolute: 0.7 10*3/uL (ref 0.1–1.0)
Monocytes Relative: 6 %
Neutro Abs: 11.2 10*3/uL — ABNORMAL HIGH (ref 1.7–7.7)
Neutrophils Relative %: 87 %
Platelet Count: 299 10*3/uL (ref 150–400)
RBC: 4.13 MIL/uL (ref 3.87–5.11)
RDW: 18.8 % — ABNORMAL HIGH (ref 11.5–15.5)
WBC Count: 12.8 10*3/uL — ABNORMAL HIGH (ref 4.0–10.5)
nRBC: 0 % (ref 0.0–0.2)

## 2021-01-04 LAB — HEPATITIS PANEL, ACUTE
HCV Ab: NONREACTIVE
Hep A IgM: NONREACTIVE
Hep B C IgM: NONREACTIVE
Hepatitis B Surface Ag: NONREACTIVE

## 2021-01-04 LAB — URIC ACID: Uric Acid, Serum: 2.6 mg/dL (ref 2.5–7.1)

## 2021-01-04 LAB — HEPATITIS B CORE ANTIBODY, TOTAL: Hep B Core Total Ab: NONREACTIVE

## 2021-01-04 LAB — HEPATITIS B SURFACE ANTIGEN: Hepatitis B Surface Ag: NONREACTIVE

## 2021-01-04 MED ORDER — ACETAMINOPHEN 325 MG PO TABS
650.0000 mg | ORAL_TABLET | Freq: Once | ORAL | Status: AC
  Administered 2021-01-04: 650 mg via ORAL

## 2021-01-04 MED ORDER — SODIUM CHLORIDE 0.9 % IV SOLN: Freq: Once | INTRAVENOUS | Status: AC

## 2021-01-04 MED ORDER — SODIUM CHLORIDE 0.9% FLUSH
10.0000 mL | INTRAVENOUS | Status: DC | PRN
  Administered 2021-01-04: 10 mL

## 2021-01-04 MED ORDER — HEPARIN SOD (PORK) LOCK FLUSH 100 UNIT/ML IV SOLN
500.0000 [IU] | Freq: Once | INTRAVENOUS | Status: AC | PRN
  Administered 2021-01-04: 500 [IU]

## 2021-01-04 MED ORDER — DIPHENHYDRAMINE HCL 25 MG PO CAPS
50.0000 mg | ORAL_CAPSULE | Freq: Once | ORAL | Status: AC
  Administered 2021-01-04: 50 mg via ORAL

## 2021-01-04 MED ORDER — SODIUM CHLORIDE 0.9 % IV SOLN
375.0000 mg/m2 | Freq: Once | INTRAVENOUS | Status: AC
  Administered 2021-01-04: 600 mg via INTRAVENOUS
  Filled 2021-01-04: qty 50

## 2021-01-04 NOTE — Telephone Encounter (Signed)
[facility] Speech Therapist [phone]: Needs verbal orders to discharge Pt. Pt. Starts chemo and pt son wants his mom to focus on chemo so he is asking to stop services for her until she gets better.

## 2021-01-04 NOTE — Patient Instructions (Signed)

## 2021-01-04 NOTE — Telephone Encounter (Signed)
Verbal orders given to stop treatment for speech therapy for now

## 2021-01-04 NOTE — Patient Instructions (Addendum)
[facility]  Discharge Instructions: Thank you for choosing [facility] to provide your oncology and hematology care.   If you have a lab appointment with the [facility], please go directly to the [facility] and check in at the registration area.  Wear comfortable clothing and clothing appropriate for easy access to any Portacath or PICC line.   We strive to give you quality time with your provider. You may need to reschedule your appointment if you arrive late (15 or more minutes).  Arriving late affects you and other patients whose appointments are after yours.  Also, if you miss three or more appointments without notifying the office, you may be dismissed from the clinic at the provider's discretion.      For prescription refill requests, have your pharmacy contact our office and allow 72 hours for refills to be completed.    Today you received the following chemotherapy and/or immunotherapy agents Rituxan       To help prevent nausea and vomiting after your treatment, we encourage you to take your nausea medication as directed.  Tonight you can take Prochlorperazine (Compazine) every 6 hours as needed for nausea.  BELOW ARE SYMPTOMS THAT SHOULD BE REPORTED IMMEDIATELY: *FEVER GREATER THAN 100.4 F (38 C) OR HIGHER *CHILLS OR SWEATING *NAUSEA AND VOMITING THAT IS NOT CONTROLLED WITH YOUR NAUSEA MEDICATION *UNUSUAL SHORTNESS OF BREATH *UNUSUAL BRUISING OR BLEEDING *URINARY PROBLEMS (pain or burning when urinating, or frequent urination) *BOWEL PROBLEMS (unusual diarrhea, constipation, pain near the anus) TENDERNESS IN MOUTH AND THROAT WITH OR WITHOUT PRESENCE OF ULCERS (sore throat, sores in mouth, or a toothache) UNUSUAL RASH, SWELLING OR PAIN  UNUSUAL VAGINAL DISCHARGE OR ITCHING   Items with * indicate a potential emergency and should be followed up as soon as possible or go to the Emergency Department if any problems should  occur.  Please show the CHEMOTHERAPY ALERT CARD or IMMUNOTHERAPY ALERT CARD at check-in to the Emergency Department and triage nurse. Should you have questions after your visit or need to cancel or reschedule your appointment, please contact [facility]  [phone] and follow the prompts.  Office hours are 8:00 a.m. to 4:30 p.m. Monday - Friday. Please note that voicemails left after 4:00 p.m. may not be returned until the following business day.  We are closed weekends and major holidays. You have access to a nurse at all times for urgent questions. Please call the main number to the clinic [phone] and follow the prompts.  For any non-urgent questions, you may also contact your provider using MyChart. We now offer e-Visits for anyone 15 and older to request care online for non-urgent symptoms. For details visit [email or web address].   Also download the MyChart app! Go to the app store, search "MyChart", open the app, select [facility], and log in with your MyChart username and password.  Due to Covid, a mask is required upon entering the hospital/clinic. If you do not have a mask, one will be given to you upon arrival. For doctor visits, patients may have 1 support person aged 40 or older with them. For treatment visits, patients cannot have anyone with them due to current Covid guidelines and our immunocompromised population.

## 2021-01-04 NOTE — Progress Notes (Signed)
Patient in chemotherapy education class with  son [place] and daughter [name] on speaker phone from [place].  Discussed side effects of Rituxan, Ifosphamide, Carboplatin, Etoposide which include but are not limited to myelosuppression, decreased appetite, fatigue, fever, allergic or infusional reaction, mucositis, cardiac toxicity, cough, SOB, altered taste, nausea and vomiting, diarrhea, constipation, elevated LFTs myalgia and arthralgias, hair loss or thinning, rash, skin dryness, nail changes, peripheral neuropathy, discolored urine, delayed wound healing, mental changes (Chemo brain), increased risk of infections, weight loss.  Reviewed infusion room and office policy and procedure and phone numbers 24 hours x 7 days a week.  Reviewed when to call the office with any concerns or problems.  Scientist, clinical (histocompatibility and immunogenetics) given.  Discussed portacath insertion and EMLA cream administration.  Antiemetic protocol and chemotherapy schedule reviewed. Patient verbalized understanding of chemotherapy indications and possible side effects.  Teachback done

## 2021-01-04 NOTE — Progress Notes (Signed)
Patient is here to commence treatment. She will get chemo education first. Her son is with her and the plan is to have her daughter call in. Patient states she is ready to start treatment. She is very positive today and expresses no concerns or complaints.   Patient has no follow up appointment or cycle two scheduled. Message sent to scheduler.   Oncology Nurse Navigator Documentation  Oncology Nurse Navigator Flowsheets 01/04/2021  Abnormal Finding Date -  Confirmed Diagnosis Date -  Phase of Treatment Chemo  Chemotherapy Actual Start Date: 01/04/2021  Navigator Follow Up Date: 01/24/2021  Navigator Follow Up Reason: Follow-up Appointment;Chemotherapy  Navigator Restaurant manager, fast food Encounter Type Treatment  Telephone -  Treatment Initiated Date 01/04/2021  Patient Visit Type MedOnc  Treatment Phase First Chemo Tx  Barriers/Navigation Needs Coordination of Care;Education  Education -  Interventions Psycho-Social Support;Coordination of Care  Acuity Level 2-Minimal Needs (1-2 Barriers Identified)  Coordination of Care Appts  Education Method -  Support Groups/Services Friends and Family  Time Spent with Patient 30

## 2021-01-05 ENCOUNTER — Ambulatory Visit: Payer: Medicare Other | Admitting: Hematology & Oncology

## 2021-01-05 ENCOUNTER — Inpatient Hospital Stay: Payer: Medicare Other

## 2021-01-05 ENCOUNTER — Other Ambulatory Visit: Payer: Self-pay

## 2021-01-05 ENCOUNTER — Ambulatory Visit: Payer: Medicare Other

## 2021-01-05 ENCOUNTER — Encounter: Payer: Self-pay | Admitting: *Deleted

## 2021-01-05 ENCOUNTER — Other Ambulatory Visit: Payer: Medicare Other

## 2021-01-05 ENCOUNTER — Other Ambulatory Visit: Payer: Self-pay | Admitting: *Deleted

## 2021-01-05 VITALS — BP 133/66 | HR 67 | Temp 98.5°F | Resp 17

## 2021-01-05 DIAGNOSIS — Z8639 Personal history of other endocrine, nutritional and metabolic disease: Secondary | ICD-10-CM | POA: Diagnosis not present

## 2021-01-05 DIAGNOSIS — Z79899 Other long term (current) drug therapy: Secondary | ICD-10-CM | POA: Diagnosis not present

## 2021-01-05 DIAGNOSIS — Z85820 Personal history of malignant melanoma of skin: Secondary | ICD-10-CM | POA: Diagnosis not present

## 2021-01-05 DIAGNOSIS — C8510 Unspecified B-cell lymphoma, unspecified site: Secondary | ICD-10-CM

## 2021-01-05 DIAGNOSIS — C8338 Diffuse large B-cell lymphoma, lymph nodes of multiple sites: Secondary | ICD-10-CM | POA: Diagnosis not present

## 2021-01-05 DIAGNOSIS — D509 Iron deficiency anemia, unspecified: Secondary | ICD-10-CM | POA: Diagnosis not present

## 2021-01-05 DIAGNOSIS — R42 Dizziness and giddiness: Secondary | ICD-10-CM | POA: Diagnosis not present

## 2021-01-05 DIAGNOSIS — Z7952 Long term (current) use of systemic steroids: Secondary | ICD-10-CM | POA: Diagnosis not present

## 2021-01-05 DIAGNOSIS — Z5111 Encounter for antineoplastic chemotherapy: Secondary | ICD-10-CM | POA: Diagnosis not present

## 2021-01-05 DIAGNOSIS — Z794 Long term (current) use of insulin: Secondary | ICD-10-CM | POA: Diagnosis not present

## 2021-01-05 DIAGNOSIS — C7931 Secondary malignant neoplasm of brain: Secondary | ICD-10-CM | POA: Diagnosis not present

## 2021-01-05 MED ORDER — SODIUM CHLORIDE 0.9 % IV SOLN
100.0000 mg/m2 | Freq: Once | INTRAVENOUS | Status: AC
  Administered 2021-01-05: 170 mg via INTRAVENOUS
  Filled 2021-01-05: qty 8.5

## 2021-01-05 MED ORDER — SODIUM CHLORIDE 0.9 % IV SOLN: Freq: Once | INTRAVENOUS | Status: DC

## 2021-01-05 MED ORDER — SODIUM CHLORIDE 0.9 % IV SOLN
10.0000 mg | Freq: Once | INTRAVENOUS | Status: DC
  Filled 2021-01-05: qty 1

## 2021-01-05 MED ORDER — SODIUM CHLORIDE 0.9 % IV SOLN
Freq: Once | INTRAVENOUS | Status: AC
  Filled 2021-01-05: qty 57

## 2021-01-05 MED ORDER — HEPARIN SOD (PORK) LOCK FLUSH 100 UNIT/ML IV SOLN
500.0000 [IU] | Freq: Once | INTRAVENOUS | Status: AC | PRN
  Administered 2021-01-05: 500 [IU]

## 2021-01-05 MED ORDER — SODIUM CHLORIDE 0.9 % IV SOLN
400.0000 mg | Freq: Once | INTRAVENOUS | Status: AC
  Administered 2021-01-05: 400 mg via INTRAVENOUS
  Filled 2021-01-05: qty 40

## 2021-01-05 MED ORDER — TEMAZEPAM 15 MG PO CAPS: 15.0000 mg | ORAL_CAPSULE | Freq: Every evening | ORAL | 0 refills | Status: DC | PRN

## 2021-01-05 MED ORDER — SODIUM CHLORIDE 0.9 % IV SOLN
150.0000 mg | Freq: Once | INTRAVENOUS | Status: AC
  Administered 2021-01-05: 150 mg via INTRAVENOUS
  Filled 2021-01-05: qty 150

## 2021-01-05 MED ORDER — SODIUM CHLORIDE 0.9 % IV SOLN
340.0000 mg/m2 | Freq: Once | INTRAVENOUS | Status: AC
  Administered 2021-01-05: 600 mg via INTRAVENOUS
  Filled 2021-01-05: qty 6

## 2021-01-05 MED ORDER — SODIUM CHLORIDE 0.9% FLUSH
10.0000 mL | INTRAVENOUS | Status: DC | PRN
  Administered 2021-01-05: 10 mL

## 2021-01-05 MED ORDER — PALONOSETRON HCL INJECTION 0.25 MG/5ML
0.2500 mg | Freq: Once | INTRAVENOUS | Status: AC
  Administered 2021-01-05: 0.25 mg via INTRAVENOUS
  Filled 2021-01-05: qty 5

## 2021-01-05 MED ORDER — SODIUM CHLORIDE 0.9 % IV SOLN: Freq: Once | INTRAVENOUS | Status: AC

## 2021-01-05 NOTE — Patient Instructions (Signed)
[facility]  Discharge Instructions: Thank you for choosing [facility] to provide your oncology and hematology care.   If you have a lab appointment with the [facility], please go directly to the [facility] and check in at the registration area.  Wear comfortable clothing and clothing appropriate for easy access to any Portacath or PICC line.   We strive to give you quality time with your provider. You may need to reschedule your appointment if you arrive late (15 or more minutes).  Arriving late affects you and other patients whose appointments are after yours.  Also, if you miss three or more appointments without notifying the office, you may be dismissed from the clinic at the provider's discretion.      For prescription refill requests, have your pharmacy contact our office and allow 72 hours for refills to be completed.    Today you received the following chemotherapy and/or immunotherapy agents Mesna/Ifex/Carboplatin/VP 16 (Etoposide)      To help prevent nausea and vomiting after your treatment, we encourage you to take your nausea medication as directed.  BELOW ARE SYMPTOMS THAT SHOULD BE REPORTED IMMEDIATELY: *FEVER GREATER THAN 100.4 F (38 C) OR HIGHER *CHILLS OR SWEATING *NAUSEA AND VOMITING THAT IS NOT CONTROLLED WITH YOUR NAUSEA MEDICATION *UNUSUAL SHORTNESS OF BREATH *UNUSUAL BRUISING OR BLEEDING *URINARY PROBLEMS (pain or burning when urinating, or frequent urination) *BOWEL PROBLEMS (unusual diarrhea, constipation, pain near the anus) TENDERNESS IN MOUTH AND THROAT WITH OR WITHOUT PRESENCE OF ULCERS (sore throat, sores in mouth, or a toothache) UNUSUAL RASH, SWELLING OR PAIN  UNUSUAL VAGINAL DISCHARGE OR ITCHING   Items with * indicate a potential emergency and should be followed up as soon as possible or go to the Emergency Department if any problems should occur.  Please show the CHEMOTHERAPY ALERT CARD or IMMUNOTHERAPY  ALERT CARD at check-in to the Emergency Department and triage nurse. Should you have questions after your visit or need to cancel or reschedule your appointment, please contact [facility]  [phone] and follow the prompts.  Office hours are 8:00 a.m. to 4:30 p.m. Monday - Friday. Please note that voicemails left after 4:00 p.m. may not be returned until the following business day.  We are closed weekends and major holidays. You have access to a nurse at all times for urgent questions. Please call the main number to the clinic [phone] and follow the prompts.  For any non-urgent questions, you may also contact your provider using MyChart. We now offer e-Visits for anyone 83 and older to request care online for non-urgent symptoms. For details visit [email or web address].   Also download the MyChart app! Go to the app store, search "MyChart", open the app, select [facility], and log in with your MyChart username and password.  Due to Covid, a mask is required upon entering the hospital/clinic. If you do not have a mask, one will be given to you upon arrival. For doctor visits, patients may have 1 support person aged 66 or older with them. For treatment visits, patients cannot have anyone with them due to current Covid guidelines and our immunocompromised population.

## 2021-01-05 NOTE — Progress Notes (Addendum)
Patient is taking Dex 4mg  po TID. Delete Dexamethasone IV premedications from cycle 1 per MD. Proceed with treatment with UA results from 01/02/21.  Raul Del Maramec, [place], BCPS, BCOP 01/05/2021 9:50 AM

## 2021-01-05 NOTE — Progress Notes (Addendum)
Patient's daughter, Magda Paganini, calling with some concerns. Patient continues to be confused, requiring 24h sitters, but last night she was significantly worse. She says the sitter reported that [place] did not sleep at all. She was up most the night and very restless. She unpacked her room resulting in her room looking "ransacked". She and her brother know this is related to the brain mets, but wonders if anything else can be done.  She also asks about respite services. They are interested in having her cared for in a facility for a few weeks to keep her safe. There are programs at [facility] and [facility]. This given to [place].   Spoke with Dr Marin Olp. He would like to send in a prescription for Restoril to see if the patient would respond to that and sleep at night. Magda Paganini is aware of new prescription. Pharmacy confirmed.   Oncology Nurse Navigator Documentation  Oncology Nurse Navigator Flowsheets 01/05/2021  Abnormal Finding Date -  Confirmed Diagnosis Date -  Phase of Treatment -  Chemotherapy Actual Start Date: -  Navigator Follow Up Date: 01/24/2021  Navigator Follow Up Reason: Follow-up Appointment;Chemotherapy  Production assistant, radio Encounter Type Telephone  Telephone Education;Medication Assistance;Incoming Call  Treatment Initiated Date -  Patient Visit Type MedOnc  Treatment Phase Active Tx  Barriers/Navigation Needs Coordination of Care;Education  Education Pain/ Symptom Management  Interventions Education;Medication Assistance;Psycho-Social Support  Acuity Level 2-Minimal Needs (1-2 Barriers Identified)  Coordination of Care -  Education Method Verbal;Teach-back  Support Groups/Services Friends and Family  Time Spent with Patient 30

## 2021-01-06 ENCOUNTER — Inpatient Hospital Stay: Payer: Medicare Other

## 2021-01-06 ENCOUNTER — Ambulatory Visit (HOSPITAL_COMMUNITY): Payer: Medicare Other

## 2021-01-06 ENCOUNTER — Other Ambulatory Visit: Payer: Self-pay | Admitting: *Deleted

## 2021-01-06 ENCOUNTER — Other Ambulatory Visit (HOSPITAL_COMMUNITY): Payer: Medicare Other

## 2021-01-06 VITALS — BP 141/67 | HR 66 | Temp 98.3°F | Resp 18

## 2021-01-06 DIAGNOSIS — Z85820 Personal history of malignant melanoma of skin: Secondary | ICD-10-CM | POA: Diagnosis not present

## 2021-01-06 DIAGNOSIS — Z7952 Long term (current) use of systemic steroids: Secondary | ICD-10-CM | POA: Diagnosis not present

## 2021-01-06 DIAGNOSIS — Z794 Long term (current) use of insulin: Secondary | ICD-10-CM | POA: Diagnosis not present

## 2021-01-06 DIAGNOSIS — Z5111 Encounter for antineoplastic chemotherapy: Secondary | ICD-10-CM | POA: Diagnosis not present

## 2021-01-06 DIAGNOSIS — Z79899 Other long term (current) drug therapy: Secondary | ICD-10-CM | POA: Diagnosis not present

## 2021-01-06 DIAGNOSIS — R42 Dizziness and giddiness: Secondary | ICD-10-CM | POA: Diagnosis not present

## 2021-01-06 DIAGNOSIS — C7931 Secondary malignant neoplasm of brain: Secondary | ICD-10-CM | POA: Diagnosis not present

## 2021-01-06 DIAGNOSIS — D509 Iron deficiency anemia, unspecified: Secondary | ICD-10-CM | POA: Diagnosis not present

## 2021-01-06 DIAGNOSIS — C8510 Unspecified B-cell lymphoma, unspecified site: Secondary | ICD-10-CM

## 2021-01-06 DIAGNOSIS — C8338 Diffuse large B-cell lymphoma, lymph nodes of multiple sites: Secondary | ICD-10-CM | POA: Diagnosis not present

## 2021-01-06 DIAGNOSIS — Z8639 Personal history of other endocrine, nutritional and metabolic disease: Secondary | ICD-10-CM | POA: Diagnosis not present

## 2021-01-06 MED ORDER — SODIUM CHLORIDE 0.9 % IV SOLN
340.0000 mg/m2 | Freq: Once | INTRAVENOUS | Status: AC
  Administered 2021-01-06: 600 mg via INTRAVENOUS
  Filled 2021-01-06: qty 6

## 2021-01-06 MED ORDER — HEPARIN SOD (PORK) LOCK FLUSH 100 UNIT/ML IV SOLN
500.0000 [IU] | Freq: Once | INTRAVENOUS | Status: AC
  Administered 2021-01-06: 500 [IU] via INTRAVENOUS

## 2021-01-06 MED ORDER — SODIUM CHLORIDE 0.9 % IV SOLN
100.0000 mg/m2 | Freq: Once | INTRAVENOUS | Status: AC
  Administered 2021-01-06: 170 mg via INTRAVENOUS
  Filled 2021-01-06: qty 8.5

## 2021-01-06 MED ORDER — SODIUM CHLORIDE 0.9 % IV SOLN: Freq: Once | INTRAVENOUS | Status: AC

## 2021-01-06 MED ORDER — FLUCONAZOLE 100 MG PO TABS: 100.0000 mg | ORAL_TABLET | Freq: Every day | ORAL | 6 refills | Status: DC

## 2021-01-06 MED ORDER — SODIUM CHLORIDE 0.9% FLUSH
10.0000 mL | Freq: Once | INTRAVENOUS | Status: AC
  Administered 2021-01-06: 10 mL via INTRAVENOUS

## 2021-01-06 MED ORDER — SODIUM CHLORIDE 0.9 % IV SOLN
Freq: Once | INTRAVENOUS | Status: AC
  Filled 2021-01-06: qty 57

## 2021-01-06 NOTE — Patient Instructions (Signed)
[facility]  Discharge Instructions: Thank you for choosing [facility] to provide your oncology and hematology care.   If you have a lab appointment with the [facility], please go directly to the [facility] and check in at the registration area.  Wear comfortable clothing and clothing appropriate for easy access to any Portacath or PICC line.   We strive to give you quality time with your provider. You may need to reschedule your appointment if you arrive late (15 or more minutes).  Arriving late affects you and other patients whose appointments are after yours.  Also, if you miss three or more appointments without notifying the office, you may be dismissed from the clinic at the provider's discretion.      For prescription refill requests, have your pharmacy contact our office and allow 72 hours for refills to be completed.    Today you received the following chemotherapy and/or immunotherapy agents Mesna, Ifex and Etoposide.   To help prevent nausea and vomiting after your treatment, we encourage you to take your nausea medication as directed.  BELOW ARE SYMPTOMS THAT SHOULD BE REPORTED IMMEDIATELY: *FEVER GREATER THAN 100.4 F (38 C) OR HIGHER *CHILLS OR SWEATING *NAUSEA AND VOMITING THAT IS NOT CONTROLLED WITH YOUR NAUSEA MEDICATION *UNUSUAL SHORTNESS OF BREATH *UNUSUAL BRUISING OR BLEEDING *URINARY PROBLEMS (pain or burning when urinating, or frequent urination) *BOWEL PROBLEMS (unusual diarrhea, constipation, pain near the anus) TENDERNESS IN MOUTH AND THROAT WITH OR WITHOUT PRESENCE OF ULCERS (sore throat, sores in mouth, or a toothache) UNUSUAL RASH, SWELLING OR PAIN  UNUSUAL VAGINAL DISCHARGE OR ITCHING   Items with * indicate a potential emergency and should be followed up as soon as possible or go to the Emergency Department if any problems should occur.  Please show the CHEMOTHERAPY ALERT CARD or IMMUNOTHERAPY ALERT CARD at  check-in to the Emergency Department and triage nurse. Should you have questions after your visit or need to cancel or reschedule your appointment, please contact [facility]  [phone] and follow the prompts.  Office hours are 8:00 a.m. to 4:30 p.m. Monday - Friday. Please note that voicemails left after 4:00 p.m. may not be returned until the following business day.  We are closed weekends and major holidays. You have access to a nurse at all times for urgent questions. Please call the main number to the clinic [phone] and follow the prompts.  For any non-urgent questions, you may also contact your provider using MyChart. We now offer e-Visits for anyone 80 and older to request care online for non-urgent symptoms. For details visit [email or web address].   Also download the MyChart app! Go to the app store, search "MyChart", open the app, select [facility], and log in with your MyChart username and password.  Due to Covid, a mask is required upon entering the hospital/clinic. If you do not have a mask, one will be given to you upon arrival. For doctor visits, patients may have 1 support person aged 62 or older with them. For treatment visits, patients cannot have anyone with them due to current Covid guidelines and our immunocompromised population.

## 2021-01-06 NOTE — Progress Notes (Signed)
Rapid Infusion Rituximab Pharmacist Evaluation  Mickala Laton is a 65 y.o. female being treated with rituximab for lymphoma. This patient may be considered for RIR.   A pharmacist has verified the patient tolerated rituximab infusions per the [facility] standard infusion protocol without grade 3-4 infusion reactions. The treatment plan will be updated to reflect RIR if the patient qualifies per the checklist below:   Age > 42 years old Yes   Clinically significant cardiovascular disease No   Circulating lymphocyte count < 5000/uL prior to cycle two Will review prior to second cycle. Lab Results  Component Value Date   LYMPHSABS 0.6 (L) 01/04/2021    Prior documented grade 3-4 infusion reaction to rituximab No   Prior documented grade 1-2 infusion reaction to rituximab (If YES, Pharmacist will confirm with Physician if patient is still a candidate for RIR) No   Previous rituximab infusion within the past 6 months Yes   Treatment Plan updated orders to reflect [place] does meet the criteria for Rapid Infusion Rituximab. This patient is going to be switched to rapid infusion rituximab.   Keanan Melander, Jacqlyn Larsen 01/06/21 2:41 PM

## 2021-01-07 ENCOUNTER — Other Ambulatory Visit: Payer: Self-pay | Admitting: *Deleted

## 2021-01-07 ENCOUNTER — Other Ambulatory Visit: Payer: Self-pay

## 2021-01-07 ENCOUNTER — Other Ambulatory Visit: Payer: Self-pay | Admitting: Hematology & Oncology

## 2021-01-07 ENCOUNTER — Inpatient Hospital Stay: Payer: Medicare Other

## 2021-01-07 VITALS — BP 128/54 | HR 65 | Temp 98.2°F | Resp 18

## 2021-01-07 DIAGNOSIS — C7931 Secondary malignant neoplasm of brain: Secondary | ICD-10-CM | POA: Diagnosis not present

## 2021-01-07 DIAGNOSIS — Z8639 Personal history of other endocrine, nutritional and metabolic disease: Secondary | ICD-10-CM | POA: Diagnosis not present

## 2021-01-07 DIAGNOSIS — Z7952 Long term (current) use of systemic steroids: Secondary | ICD-10-CM | POA: Diagnosis not present

## 2021-01-07 DIAGNOSIS — C8338 Diffuse large B-cell lymphoma, lymph nodes of multiple sites: Secondary | ICD-10-CM | POA: Diagnosis not present

## 2021-01-07 DIAGNOSIS — Z85820 Personal history of malignant melanoma of skin: Secondary | ICD-10-CM | POA: Diagnosis not present

## 2021-01-07 DIAGNOSIS — Z794 Long term (current) use of insulin: Secondary | ICD-10-CM | POA: Diagnosis not present

## 2021-01-07 DIAGNOSIS — Z5111 Encounter for antineoplastic chemotherapy: Secondary | ICD-10-CM | POA: Diagnosis not present

## 2021-01-07 DIAGNOSIS — Z79899 Other long term (current) drug therapy: Secondary | ICD-10-CM | POA: Diagnosis not present

## 2021-01-07 DIAGNOSIS — R42 Dizziness and giddiness: Secondary | ICD-10-CM | POA: Diagnosis not present

## 2021-01-07 DIAGNOSIS — D509 Iron deficiency anemia, unspecified: Secondary | ICD-10-CM | POA: Diagnosis not present

## 2021-01-07 DIAGNOSIS — C8510 Unspecified B-cell lymphoma, unspecified site: Secondary | ICD-10-CM

## 2021-01-07 MED ORDER — SODIUM CHLORIDE 0.9 % IV SOLN
Freq: Once | INTRAVENOUS | Status: AC
  Filled 2021-01-07: qty 57

## 2021-01-07 MED ORDER — SODIUM CHLORIDE 0.9 % IV SOLN
100.0000 mg/m2 | Freq: Once | INTRAVENOUS | Status: AC
  Administered 2021-01-07: 170 mg via INTRAVENOUS
  Filled 2021-01-07: qty 8.5

## 2021-01-07 MED ORDER — HEPARIN SOD (PORK) LOCK FLUSH 100 UNIT/ML IV SOLN
500.0000 [IU] | Freq: Once | INTRAVENOUS | Status: AC | PRN
  Administered 2021-01-07: 500 [IU]

## 2021-01-07 MED ORDER — SODIUM CHLORIDE 0.9% FLUSH
10.0000 mL | INTRAVENOUS | Status: DC | PRN
  Administered 2021-01-07: 10 mL

## 2021-01-07 MED ORDER — CIPROFLOXACIN HCL 500 MG PO TABS: 500.0000 mg | ORAL_TABLET | Freq: Every day | ORAL | 8 refills | Status: DC

## 2021-01-07 MED ORDER — PALONOSETRON HCL INJECTION 0.25 MG/5ML
0.2500 mg | Freq: Once | INTRAVENOUS | Status: AC
  Administered 2021-01-07: 0.25 mg via INTRAVENOUS
  Filled 2021-01-07: qty 5

## 2021-01-07 MED ORDER — SODIUM CHLORIDE 0.9 % IV SOLN: Freq: Once | INTRAVENOUS | Status: AC

## 2021-01-07 MED ORDER — SODIUM CHLORIDE 0.9 % IV SOLN
340.0000 mg/m2 | Freq: Once | INTRAVENOUS | Status: AC
  Administered 2021-01-07: 600 mg via INTRAVENOUS
  Filled 2021-01-07: qty 6

## 2021-01-07 NOTE — Patient Instructions (Signed)
[facility]  Discharge Instructions: Thank you for choosing [facility] to provide your oncology and hematology care.   If you have a lab appointment with the [facility], please go directly to the [facility] and check in at the registration area.  Wear comfortable clothing and clothing appropriate for easy access to any Portacath or PICC line.   We strive to give you quality time with your provider. You may need to reschedule your appointment if you arrive late (15 or more minutes).  Arriving late affects you and other patients whose appointments are after yours.  Also, if you miss three or more appointments without notifying the office, you may be dismissed from the clinic at the provider's discretion.      For prescription refill requests, have your pharmacy contact our office and allow 72 hours for refills to be completed.    Today you received the following chemotherapy and/or immunotherapy agents Imex, Mesna and Etoposide.    To help prevent nausea and vomiting after your treatment, we encourage you to take your nausea medication as directed.  BELOW ARE SYMPTOMS THAT SHOULD BE REPORTED IMMEDIATELY: *FEVER GREATER THAN 100.4 F (38 C) OR HIGHER *CHILLS OR SWEATING *NAUSEA AND VOMITING THAT IS NOT CONTROLLED WITH YOUR NAUSEA MEDICATION *UNUSUAL SHORTNESS OF BREATH *UNUSUAL BRUISING OR BLEEDING *URINARY PROBLEMS (pain or burning when urinating, or frequent urination) *BOWEL PROBLEMS (unusual diarrhea, constipation, pain near the anus) TENDERNESS IN MOUTH AND THROAT WITH OR WITHOUT PRESENCE OF ULCERS (sore throat, sores in mouth, or a toothache) UNUSUAL RASH, SWELLING OR PAIN  UNUSUAL VAGINAL DISCHARGE OR ITCHING   Items with * indicate a potential emergency and should be followed up as soon as possible or go to the Emergency Department if any problems should occur.  Please show the CHEMOTHERAPY ALERT CARD or IMMUNOTHERAPY ALERT CARD at  check-in to the Emergency Department and triage nurse. Should you have questions after your visit or need to cancel or reschedule your appointment, please contact [facility]  [phone] and follow the prompts.  Office hours are 8:00 a.m. to 4:30 p.m. Monday - Friday. Please note that voicemails left after 4:00 p.m. may not be returned until the following business day.  We are closed weekends and major holidays. You have access to a nurse at all times for urgent questions. Please call the main number to the clinic [phone] and follow the prompts.  For any non-urgent questions, you may also contact your provider using MyChart. We now offer e-Visits for anyone 32 and older to request care online for non-urgent symptoms. For details visit [email or web address].   Also download the MyChart app! Go to the app store, search "MyChart", open the app, select [facility], and log in with your MyChart username and password.  Due to Covid, a mask is required upon entering the hospital/clinic. If you do not have a mask, one will be given to you upon arrival. For doctor visits, patients may have 1 support person aged 25 or older with them. For treatment visits, patients cannot have anyone with them due to current Covid guidelines and our immunocompromised population.

## 2021-01-09 ENCOUNTER — Other Ambulatory Visit: Payer: Self-pay | Admitting: Hematology

## 2021-01-09 MED ORDER — CIPROFLOXACIN HCL 500 MG PO TABS: 500.0000 mg | ORAL_TABLET | Freq: Every day | ORAL | 0 refills | Status: DC

## 2021-01-09 NOTE — Progress Notes (Signed)
On call note  Patient called that she did not pickup Cipro prescription on time and now her pharmacy is closed for the weekend. Per Dr Antonieta Pert prescription -- sent to [facility] in [place] per patients request.  Sullivan Lone MD MS

## 2021-01-10 ENCOUNTER — Inpatient Hospital Stay: Payer: Medicare Other

## 2021-01-10 ENCOUNTER — Other Ambulatory Visit (HOSPITAL_BASED_OUTPATIENT_CLINIC_OR_DEPARTMENT_OTHER): Payer: Self-pay

## 2021-01-10 ENCOUNTER — Other Ambulatory Visit: Payer: Self-pay

## 2021-01-10 VITALS — BP 141/46 | HR 69 | Temp 97.9°F | Resp 18

## 2021-01-10 DIAGNOSIS — C8338 Diffuse large B-cell lymphoma, lymph nodes of multiple sites: Secondary | ICD-10-CM | POA: Diagnosis not present

## 2021-01-10 DIAGNOSIS — Z8639 Personal history of other endocrine, nutritional and metabolic disease: Secondary | ICD-10-CM | POA: Diagnosis not present

## 2021-01-10 DIAGNOSIS — C8510 Unspecified B-cell lymphoma, unspecified site: Secondary | ICD-10-CM

## 2021-01-10 DIAGNOSIS — Z85820 Personal history of malignant melanoma of skin: Secondary | ICD-10-CM | POA: Diagnosis not present

## 2021-01-10 DIAGNOSIS — Z5111 Encounter for antineoplastic chemotherapy: Secondary | ICD-10-CM | POA: Diagnosis not present

## 2021-01-10 DIAGNOSIS — Z79899 Other long term (current) drug therapy: Secondary | ICD-10-CM | POA: Diagnosis not present

## 2021-01-10 DIAGNOSIS — R42 Dizziness and giddiness: Secondary | ICD-10-CM | POA: Diagnosis not present

## 2021-01-10 DIAGNOSIS — C7931 Secondary malignant neoplasm of brain: Secondary | ICD-10-CM | POA: Diagnosis not present

## 2021-01-10 DIAGNOSIS — D509 Iron deficiency anemia, unspecified: Secondary | ICD-10-CM | POA: Diagnosis not present

## 2021-01-10 DIAGNOSIS — Z794 Long term (current) use of insulin: Secondary | ICD-10-CM | POA: Diagnosis not present

## 2021-01-10 DIAGNOSIS — Z7952 Long term (current) use of systemic steroids: Secondary | ICD-10-CM | POA: Diagnosis not present

## 2021-01-10 MED ORDER — PEGFILGRASTIM-BMEZ 6 MG/0.6ML ~~LOC~~ SOSY
6.0000 mg | PREFILLED_SYRINGE | Freq: Once | SUBCUTANEOUS | Status: AC
  Administered 2021-01-10: 6 mg via SUBCUTANEOUS
  Filled 2021-01-10: qty 0.6

## 2021-01-10 NOTE — Patient Instructions (Signed)

## 2021-01-11 ENCOUNTER — Encounter: Payer: Self-pay | Admitting: *Deleted

## 2021-01-11 ENCOUNTER — Encounter: Payer: Self-pay | Admitting: General Practice

## 2021-01-11 DIAGNOSIS — E278 Other specified disorders of adrenal gland: Secondary | ICD-10-CM | POA: Diagnosis not present

## 2021-01-11 DIAGNOSIS — G9341 Metabolic encephalopathy: Secondary | ICD-10-CM | POA: Diagnosis not present

## 2021-01-11 DIAGNOSIS — Z9181 History of falling: Secondary | ICD-10-CM | POA: Diagnosis not present

## 2021-01-11 DIAGNOSIS — M47812 Spondylosis without myelopathy or radiculopathy, cervical region: Secondary | ICD-10-CM | POA: Diagnosis not present

## 2021-01-11 DIAGNOSIS — I6523 Occlusion and stenosis of bilateral carotid arteries: Secondary | ICD-10-CM | POA: Diagnosis not present

## 2021-01-11 DIAGNOSIS — H35452 Secondary pigmentary degeneration, left eye: Secondary | ICD-10-CM | POA: Diagnosis not present

## 2021-01-11 DIAGNOSIS — D509 Iron deficiency anemia, unspecified: Secondary | ICD-10-CM | POA: Diagnosis not present

## 2021-01-11 DIAGNOSIS — Z8673 Personal history of transient ischemic attack (TIA), and cerebral infarction without residual deficits: Secondary | ICD-10-CM | POA: Diagnosis not present

## 2021-01-11 DIAGNOSIS — I10 Essential (primary) hypertension: Secondary | ICD-10-CM | POA: Diagnosis not present

## 2021-01-11 DIAGNOSIS — Z85820 Personal history of malignant melanoma of skin: Secondary | ICD-10-CM | POA: Diagnosis not present

## 2021-01-11 DIAGNOSIS — C8510 Unspecified B-cell lymphoma, unspecified site: Secondary | ICD-10-CM

## 2021-01-11 DIAGNOSIS — E871 Hypo-osmolality and hyponatremia: Secondary | ICD-10-CM | POA: Diagnosis not present

## 2021-01-11 DIAGNOSIS — H35363 Drusen (degenerative) of macula, bilateral: Secondary | ICD-10-CM | POA: Diagnosis not present

## 2021-01-11 DIAGNOSIS — Z7952 Long term (current) use of systemic steroids: Secondary | ICD-10-CM | POA: Diagnosis not present

## 2021-01-11 DIAGNOSIS — D630 Anemia in neoplastic disease: Secondary | ICD-10-CM | POA: Diagnosis not present

## 2021-01-11 DIAGNOSIS — E7800 Pure hypercholesterolemia, unspecified: Secondary | ICD-10-CM | POA: Diagnosis not present

## 2021-01-11 DIAGNOSIS — H35362 Drusen (degenerative) of macula, left eye: Secondary | ICD-10-CM | POA: Diagnosis not present

## 2021-01-11 DIAGNOSIS — H43392 Other vitreous opacities, left eye: Secondary | ICD-10-CM | POA: Diagnosis not present

## 2021-01-11 DIAGNOSIS — R590 Localized enlarged lymph nodes: Secondary | ICD-10-CM | POA: Diagnosis not present

## 2021-01-11 DIAGNOSIS — H353112 Nonexudative age-related macular degeneration, right eye, intermediate dry stage: Secondary | ICD-10-CM | POA: Diagnosis not present

## 2021-01-11 DIAGNOSIS — R4701 Aphasia: Secondary | ICD-10-CM | POA: Diagnosis not present

## 2021-01-11 DIAGNOSIS — C7931 Secondary malignant neoplasm of brain: Secondary | ICD-10-CM | POA: Diagnosis not present

## 2021-01-11 NOTE — Progress Notes (Signed)
Received a call from the patient's daughter with a few concerns.   Over the weekend, the patient has mentioned a few time having a "knot" in her chest that impacts her ability to swallow. No chocking is observed. Magda Paganini does believe this is causing some of the decreased intake and therefor weight loss.   Magda Paganini also mentions some dependant edema over the weekend. She encouraged her mom to elevate her legs when siting and she does believe the edema is improved today.   The family is still working through care options for their mom. They have contacted a few facilities about respite care, although due to patients confusion/mental state they would have to provide sitters, even at the facility. They have also looked into memory care units, however, without a dementia or similar diagnosis, they would be unable to admit patient to one of these units.   Patient's follow up appointments are not complete. Message sent to scheduling to complete the next treatment week.   All the above sent to Dr Marin Olp via Suanne Marker as he is out of the office. Referral placed to social work in attempt to help with options/resources for home/facility care.   Oncology Nurse Navigator Documentation  Oncology Nurse Navigator Flowsheets 01/11/2021  Abnormal Finding Date -  Confirmed Diagnosis Date -  Phase of Treatment -  Chemotherapy Actual Start Date: -  Navigator Follow Up Date: 01/24/2021  Navigator Follow Up Reason: Follow-up Appointment;Chemotherapy  Navigator Restaurant manager, fast food Encounter Type Telephone  Telephone Patient Update;Incoming Call  Treatment Initiated Date -  Patient Visit Type MedOnc  Treatment Phase Active Tx  Barriers/Navigation Needs Coordination of Care;Education  Education Other  Interventions Coordination of Care;Education;Psycho-Social Support;Referrals  Acuity Level 2-Minimal Needs (1-2 Barriers Identified)  Referrals Social Work  Coordination of Care Appts  Education  Method Verbal  Support Groups/Services Friends and Family  Time Spent with Patient 67

## 2021-01-11 NOTE — Progress Notes (Signed)
[facility] CSW Progress Notes  Call from daughter, Billy [place], expressing concerns about her mother's mental state.  Daughter visited this weekend, observed patient "talking non stop, worsening memory from 6 PM - midnight, talking about 'my daughter Magda Paganini' using incorrect verbiage - third person vs first person, up most of the night, only sleeps 2 hours at a time since her diagnosis, agitated and very restless."  Daughter has noted memory issues since approx one month ago.  Patient will not stay in bed, prefers to be up or in recliner chair.  Family is concerned about her use of money (she is overspending), lack of concept of time. Aspects of her personality have changed since diagnosis as well.  "She is totally not herself."  Her vision has been significantly affected - had eye surgery just prior to last hospitalization.  She had a mini stroke in August 2022, led to hemorrhaging behind the eye.  Similar episode occurred in October 2022. Bladder control is also an ongoing issue over the past year.  "She uses the bathroom at least once/hour."  She is unable to hold her urine long enough to urinate in the toilet.  The vision and bladder symptoms are major concerns for patient.    Patient is a widow, lives alone after the death after the death of her husband 18 months ago.  Daughter lives in [place], son lives somewhat nearby.  Family has hired round the clock caregivers in the home.  This has become problematic due to both the intensity of the patient's behaviors and last minute cancellations by care providers.    Daughter has talked w several assisted living facilities with dementia care units.  Family is considering whether she would be better cared for if in a facility vs at home.  Discussed the difficulties with patient making a transition to another living situation vs being at home with better symptom management.  CSW will discuss options with Erenest Blank nurse navigator.  Asked daughter to keep in touch.   Offered resources including patient navigation at [employer] and [employer] [place].  Daughter will call these resources.  Edwyna Shell, LCSW Clinical Social Worker Phone:  [phone]

## 2021-01-12 ENCOUNTER — Ambulatory Visit: Payer: Medicare Other

## 2021-01-13 ENCOUNTER — Inpatient Hospital Stay: Payer: Medicare Other

## 2021-01-13 ENCOUNTER — Other Ambulatory Visit: Payer: Self-pay | Admitting: *Deleted

## 2021-01-13 ENCOUNTER — Telehealth: Payer: Self-pay | Admitting: *Deleted

## 2021-01-13 ENCOUNTER — Other Ambulatory Visit: Payer: Self-pay

## 2021-01-13 VITALS — BP 145/59 | HR 76 | Temp 98.3°F | Resp 18

## 2021-01-13 DIAGNOSIS — C8510 Unspecified B-cell lymphoma, unspecified site: Secondary | ICD-10-CM

## 2021-01-13 DIAGNOSIS — C8590 Non-Hodgkin lymphoma, unspecified, unspecified site: Secondary | ICD-10-CM

## 2021-01-13 DIAGNOSIS — D500 Iron deficiency anemia secondary to blood loss (chronic): Secondary | ICD-10-CM

## 2021-01-13 DIAGNOSIS — G928 Other toxic encephalopathy: Secondary | ICD-10-CM | POA: Diagnosis not present

## 2021-01-13 LAB — CBC WITH DIFFERENTIAL (CANCER CENTER ONLY)
Abs Immature Granulocytes: 0.01 10*3/uL (ref 0.00–0.07)
Basophils Absolute: 0 10*3/uL (ref 0.0–0.1)
Basophils Relative: 0 %
Eosinophils Absolute: 0 10*3/uL (ref 0.0–0.5)
Eosinophils Relative: 0 %
HCT: 32.9 % — ABNORMAL LOW (ref 36.0–46.0)
Hemoglobin: 10.8 g/dL — ABNORMAL LOW (ref 12.0–15.0)
Immature Granulocytes: 1 %
Lymphocytes Relative: 39 %
Lymphs Abs: 0.3 10*3/uL — ABNORMAL LOW (ref 0.7–4.0)
MCH: 24.9 pg — ABNORMAL LOW (ref 26.0–34.0)
MCHC: 32.8 g/dL (ref 30.0–36.0)
MCV: 75.8 fL — ABNORMAL LOW (ref 80.0–100.0)
Monocytes Absolute: 0.1 10*3/uL (ref 0.1–1.0)
Monocytes Relative: 13 %
Neutro Abs: 0.4 10*3/uL — CL (ref 1.7–7.7)
Neutrophils Relative %: 47 %
Platelet Count: 163 10*3/uL (ref 150–400)
RBC: 4.34 MIL/uL (ref 3.87–5.11)
RDW: 17.4 % — ABNORMAL HIGH (ref 11.5–15.5)
Smear Review: NORMAL
WBC Count: 0.8 10*3/uL — CL (ref 4.0–10.5)
nRBC: 0 % (ref 0.0–0.2)

## 2021-01-13 LAB — CMP (CANCER CENTER ONLY)
ALT: 56 U/L — ABNORMAL HIGH (ref 0–44)
AST: 18 U/L (ref 15–41)
Albumin: 3.5 g/dL (ref 3.5–5.0)
Alkaline Phosphatase: 72 U/L (ref 38–126)
Anion gap: 5 (ref 5–15)
BUN: 14 mg/dL (ref 8–23)
CO2: 32 mmol/L (ref 22–32)
Calcium: 9.6 mg/dL (ref 8.9–10.3)
Chloride: 93 mmol/L — ABNORMAL LOW (ref 98–111)
Creatinine: 0.48 mg/dL (ref 0.44–1.00)
GFR, Estimated: >60 mL/min (ref >60)
Glucose, Bld: 157 mg/dL — ABNORMAL HIGH (ref 70–99)
Potassium: 4 mmol/L (ref 3.5–5.1)
Sodium: 130 mmol/L — ABNORMAL LOW (ref 135–145)
Total Bilirubin: 0.4 mg/dL (ref 0.3–1.2)
Total Protein: 6.6 g/dL (ref 6.5–8.1)

## 2021-01-13 MED ORDER — SODIUM CHLORIDE 0.9 % IV SOLN
1000.0000 mg | Freq: Once | INTRAVENOUS | Status: AC
  Administered 2021-01-13: 11:00:00 1000 mg via INTRAVENOUS
  Filled 2021-01-13: qty 10

## 2021-01-13 MED ORDER — SODIUM CHLORIDE 0.9 % IV SOLN: Freq: Once | INTRAVENOUS | Status: AC

## 2021-01-13 MED ORDER — DEXAMETHASONE 4 MG PO TABS: 4.0000 mg | ORAL_TABLET | Freq: Two times a day (BID) | ORAL | 3 refills | Status: DC

## 2021-01-13 MED ORDER — HEPARIN SOD (PORK) LOCK FLUSH 100 UNIT/ML IV SOLN
500.0000 [IU] | Freq: Once | INTRAVENOUS | Status: AC | PRN
  Administered 2021-01-13: 12:00:00 500 [IU]

## 2021-01-13 MED ORDER — SODIUM CHLORIDE 0.9% FLUSH
10.0000 mL | Freq: Once | INTRAVENOUS | Status: AC | PRN
  Administered 2021-01-13: 12:00:00 10 mL

## 2021-01-13 NOTE — Telephone Encounter (Signed)
Notified of patients WBC at .8 and ANC at .4.  Dr Marin Olp notified.  No orders received.

## 2021-01-13 NOTE — Patient Instructions (Signed)
[name]  Discharge Instructions: Thank you for choosing [facility] to provide your oncology and hematology care.   If you have a lab appointment with the [facility], please go directly to the [facility] and check in at the registration area.  Wear comfortable clothing and clothing appropriate for easy access to any Portacath or PICC line.   We strive to give you quality time with your provider. You may need to reschedule your appointment if you arrive late (15 or more minutes).  Arriving late affects you and other patients whose appointments are after yours.  Also, if you miss three or more appointments without notifying the office, you may be dismissed from the clinic at the provider's discretion.      For prescription refill requests, have your pharmacy contact our office and allow 72 hours for refills to be completed.    Today you received the following chemotherapy and/or immunotherapy agents monoferric      To help prevent nausea and vomiting after your treatment, we encourage you to take your nausea medication as directed.  BELOW ARE SYMPTOMS THAT SHOULD BE REPORTED IMMEDIATELY: *FEVER GREATER THAN 100.4 F (38 C) OR HIGHER *CHILLS OR SWEATING *NAUSEA AND VOMITING THAT IS NOT CONTROLLED WITH YOUR NAUSEA MEDICATION *UNUSUAL SHORTNESS OF BREATH *UNUSUAL BRUISING OR BLEEDING *URINARY PROBLEMS (pain or burning when urinating, or frequent urination) *BOWEL PROBLEMS (unusual diarrhea, constipation, pain near the anus) TENDERNESS IN MOUTH AND THROAT WITH OR WITHOUT PRESENCE OF ULCERS (sore throat, sores in mouth, or a toothache) UNUSUAL RASH, SWELLING OR PAIN  UNUSUAL VAGINAL DISCHARGE OR ITCHING   Items with * indicate a potential emergency and should be followed up as soon as possible or go to the Emergency Department if any problems should occur.  Please show the CHEMOTHERAPY ALERT CARD or IMMUNOTHERAPY ALERT CARD at check-in to the  Emergency Department and triage nurse. Should you have questions after your visit or need to cancel or reschedule your appointment, please contact [facility]  [phone] and follow the prompts.  Office hours are 8:00 a.m. to 4:30 p.m. Monday - Friday. Please note that voicemails left after 4:00 p.m. may not be returned until the following business day.  We are closed weekends and major holidays. You have access to a nurse at all times for urgent questions. Please call the main number to the clinic [phone] and follow the prompts.  For any non-urgent questions, you may also contact your provider using MyChart. We now offer e-Visits for anyone 79 and older to request care online for non-urgent symptoms. For details visit [email or web address].   Also download the MyChart app! Go to the app store, search "MyChart", open the app, select [facility], and log in with your MyChart username and password.  Due to Covid, a mask is required upon entering the hospital/clinic. If you do not have a mask, one will be given to you upon arrival. For doctor visits, patients may have 1 support person aged 63 or older with them. For treatment visits, patients cannot have anyone with them due to current Covid guidelines and our immunocompromised population.

## 2021-01-14 ENCOUNTER — Emergency Department (HOSPITAL_BASED_OUTPATIENT_CLINIC_OR_DEPARTMENT_OTHER): Payer: Medicare Other

## 2021-01-14 ENCOUNTER — Other Ambulatory Visit: Payer: Self-pay

## 2021-01-14 ENCOUNTER — Inpatient Hospital Stay (HOSPITAL_BASED_OUTPATIENT_CLINIC_OR_DEPARTMENT_OTHER)
Admission: EM | Admit: 2021-01-14 | Discharge: 2021-02-09 | DRG: 092 | Disposition: A | Discharge status: Home / Self Care | Payer: Medicare Other | Attending: Hematology & Oncology | Admitting: Hematology & Oncology

## 2021-01-14 ENCOUNTER — Encounter: Payer: Self-pay | Admitting: *Deleted

## 2021-01-14 ENCOUNTER — Ambulatory Visit: Payer: Medicare Other

## 2021-01-14 ENCOUNTER — Other Ambulatory Visit: Payer: Medicare Other

## 2021-01-14 ENCOUNTER — Encounter (HOSPITAL_BASED_OUTPATIENT_CLINIC_OR_DEPARTMENT_OTHER): Payer: Self-pay | Admitting: *Deleted

## 2021-01-14 ENCOUNTER — Telehealth: Payer: Self-pay | Admitting: *Deleted

## 2021-01-14 DIAGNOSIS — R42 Dizziness and giddiness: Secondary | ICD-10-CM | POA: Diagnosis not present

## 2021-01-14 DIAGNOSIS — R609 Edema, unspecified: Secondary | ICD-10-CM | POA: Diagnosis not present

## 2021-01-14 DIAGNOSIS — I700 Atherosclerosis of aorta: Secondary | ICD-10-CM | POA: Diagnosis not present

## 2021-01-14 DIAGNOSIS — Z885 Allergy status to narcotic agent status: Secondary | ICD-10-CM | POA: Diagnosis not present

## 2021-01-14 DIAGNOSIS — N289 Disorder of kidney and ureter, unspecified: Secondary | ICD-10-CM | POA: Diagnosis not present

## 2021-01-14 DIAGNOSIS — E876 Hypokalemia: Secondary | ICD-10-CM | POA: Diagnosis present

## 2021-01-14 DIAGNOSIS — R4020 Unspecified coma: Secondary | ICD-10-CM

## 2021-01-14 DIAGNOSIS — D6481 Anemia due to antineoplastic chemotherapy: Secondary | ICD-10-CM | POA: Diagnosis not present

## 2021-01-14 DIAGNOSIS — T458X5A Adverse effect of other primarily systemic and hematological agents, initial encounter: Secondary | ICD-10-CM | POA: Diagnosis present

## 2021-01-14 DIAGNOSIS — C8339 Diffuse large B-cell lymphoma, extranodal and solid organ sites: Secondary | ICD-10-CM | POA: Diagnosis not present

## 2021-01-14 DIAGNOSIS — C8510 Unspecified B-cell lymphoma, unspecified site: Secondary | ICD-10-CM | POA: Diagnosis not present

## 2021-01-14 DIAGNOSIS — G928 Other toxic encephalopathy: Principal | ICD-10-CM | POA: Diagnosis present

## 2021-01-14 DIAGNOSIS — E861 Hypovolemia: Secondary | ICD-10-CM | POA: Diagnosis not present

## 2021-01-14 DIAGNOSIS — G9341 Metabolic encephalopathy: Secondary | ICD-10-CM | POA: Diagnosis not present

## 2021-01-14 DIAGNOSIS — C7931 Secondary malignant neoplasm of brain: Secondary | ICD-10-CM | POA: Diagnosis present

## 2021-01-14 DIAGNOSIS — K1230 Oral mucositis (ulcerative), unspecified: Secondary | ICD-10-CM | POA: Diagnosis present

## 2021-01-14 DIAGNOSIS — K121 Other forms of stomatitis: Secondary | ICD-10-CM | POA: Diagnosis not present

## 2021-01-14 DIAGNOSIS — D3501 Benign neoplasm of right adrenal gland: Secondary | ICD-10-CM | POA: Diagnosis not present

## 2021-01-14 DIAGNOSIS — G936 Cerebral edema: Secondary | ICD-10-CM | POA: Diagnosis not present

## 2021-01-14 DIAGNOSIS — G9389 Other specified disorders of brain: Secondary | ICD-10-CM | POA: Diagnosis not present

## 2021-01-14 DIAGNOSIS — T380X5A Adverse effect of glucocorticoids and synthetic analogues, initial encounter: Secondary | ICD-10-CM | POA: Diagnosis not present

## 2021-01-14 DIAGNOSIS — Z85820 Personal history of malignant melanoma of skin: Secondary | ICD-10-CM

## 2021-01-14 DIAGNOSIS — E785 Hyperlipidemia, unspecified: Secondary | ICD-10-CM | POA: Diagnosis not present

## 2021-01-14 DIAGNOSIS — M5489 Other dorsalgia: Secondary | ICD-10-CM | POA: Diagnosis not present

## 2021-01-14 DIAGNOSIS — C8338 Diffuse large B-cell lymphoma, lymph nodes of multiple sites: Secondary | ICD-10-CM | POA: Diagnosis not present

## 2021-01-14 DIAGNOSIS — C8333 Diffuse large B-cell lymphoma, intra-abdominal lymph nodes: Secondary | ICD-10-CM | POA: Diagnosis not present

## 2021-01-14 DIAGNOSIS — Z79899 Other long term (current) drug therapy: Secondary | ICD-10-CM

## 2021-01-14 DIAGNOSIS — D508 Other iron deficiency anemias: Secondary | ICD-10-CM | POA: Diagnosis not present

## 2021-01-14 DIAGNOSIS — D709 Neutropenia, unspecified: Secondary | ICD-10-CM | POA: Diagnosis present

## 2021-01-14 DIAGNOSIS — D509 Iron deficiency anemia, unspecified: Secondary | ICD-10-CM | POA: Diagnosis not present

## 2021-01-14 DIAGNOSIS — R4182 Altered mental status, unspecified: Secondary | ICD-10-CM | POA: Diagnosis not present

## 2021-01-14 DIAGNOSIS — D696 Thrombocytopenia, unspecified: Secondary | ICD-10-CM | POA: Diagnosis not present

## 2021-01-14 DIAGNOSIS — E871 Hypo-osmolality and hyponatremia: Secondary | ICD-10-CM | POA: Diagnosis present

## 2021-01-14 DIAGNOSIS — T451X5A Adverse effect of antineoplastic and immunosuppressive drugs, initial encounter: Secondary | ICD-10-CM | POA: Diagnosis present

## 2021-01-14 DIAGNOSIS — Z888 Allergy status to other drugs, medicaments and biological substances status: Secondary | ICD-10-CM | POA: Diagnosis not present

## 2021-01-14 DIAGNOSIS — R402424 Glasgow coma scale score 9-12, 24 hours or more after hospital admission: Secondary | ICD-10-CM | POA: Diagnosis not present

## 2021-01-14 DIAGNOSIS — R400 Somnolence: Secondary | ICD-10-CM | POA: Diagnosis not present

## 2021-01-14 DIAGNOSIS — D72829 Elevated white blood cell count, unspecified: Secondary | ICD-10-CM | POA: Diagnosis not present

## 2021-01-14 DIAGNOSIS — R401 Stupor: Secondary | ICD-10-CM | POA: Diagnosis not present

## 2021-01-14 DIAGNOSIS — C8590 Non-Hodgkin lymphoma, unspecified, unspecified site: Secondary | ICD-10-CM | POA: Diagnosis not present

## 2021-01-14 DIAGNOSIS — D703 Neutropenia due to infection: Secondary | ICD-10-CM

## 2021-01-14 DIAGNOSIS — Z20822 Contact with and (suspected) exposure to covid-19: Secondary | ICD-10-CM | POA: Diagnosis present

## 2021-01-14 DIAGNOSIS — R112 Nausea with vomiting, unspecified: Secondary | ICD-10-CM

## 2021-01-14 DIAGNOSIS — D7389 Other diseases of spleen: Secondary | ICD-10-CM | POA: Diagnosis not present

## 2021-01-14 LAB — COMPREHENSIVE METABOLIC PANEL
ALT: 55 U/L — ABNORMAL HIGH (ref 0–44)
AST: 26 U/L (ref 15–41)
Albumin: 3.1 g/dL — ABNORMAL LOW (ref 3.5–5.0)
Alkaline Phosphatase: 72 U/L (ref 38–126)
Anion gap: 8 (ref 5–15)
BUN: 15 mg/dL (ref 8–23)
CO2: 27 mmol/L (ref 22–32)
Calcium: 9.1 mg/dL (ref 8.9–10.3)
Chloride: 92 mmol/L — ABNORMAL LOW (ref 98–111)
Creatinine, Ser: 0.44 mg/dL (ref 0.44–1.00)
GFR, Estimated: >60 mL/min (ref >60)
Glucose, Bld: 122 mg/dL — ABNORMAL HIGH (ref 70–99)
Potassium: 3.5 mmol/L (ref 3.5–5.1)
Sodium: 127 mmol/L — ABNORMAL LOW (ref 135–145)
Total Bilirubin: 1 mg/dL (ref 0.3–1.2)
Total Protein: 6.3 g/dL — ABNORMAL LOW (ref 6.5–8.1)

## 2021-01-14 LAB — CBC WITH DIFFERENTIAL/PLATELET
Abs Immature Granulocytes: 0.1 10*3/uL — ABNORMAL HIGH (ref 0.00–0.07)
Basophils Absolute: 0 10*3/uL (ref 0.0–0.1)
Basophils Relative: 2 %
Eosinophils Absolute: 0 10*3/uL (ref 0.0–0.5)
Eosinophils Relative: 0 %
HCT: 33.9 % — ABNORMAL LOW (ref 36.0–46.0)
Hemoglobin: 11.1 g/dL — ABNORMAL LOW (ref 12.0–15.0)
Immature Granulocytes: 8 %
Lymphocytes Relative: 55 %
Lymphs Abs: 0.7 10*3/uL (ref 0.7–4.0)
MCH: 24.7 pg — ABNORMAL LOW (ref 26.0–34.0)
MCHC: 32.7 g/dL (ref 30.0–36.0)
MCV: 75.3 fL — ABNORMAL LOW (ref 80.0–100.0)
Monocytes Absolute: 0.2 10*3/uL (ref 0.1–1.0)
Monocytes Relative: 16 %
Neutro Abs: 0.3 10*3/uL — CL (ref 1.7–7.7)
Neutrophils Relative %: 19 %
Platelets: 142 10*3/uL — ABNORMAL LOW (ref 150–400)
RBC: 4.5 MIL/uL (ref 3.87–5.11)
RDW: 17.5 % — ABNORMAL HIGH (ref 11.5–15.5)
Smear Review: NORMAL
WBC: 1.3 10*3/uL — CL (ref 4.0–10.5)
nRBC: 1.6 % — ABNORMAL HIGH (ref 0.0–0.2)

## 2021-01-14 LAB — URINALYSIS, MICROSCOPIC (REFLEX)

## 2021-01-14 LAB — RESP PANEL BY RT-PCR (FLU A&B, COVID) ARPGX2
Influenza A by PCR: NEGATIVE
Influenza B by PCR: NEGATIVE
SARS Coronavirus 2 by RT PCR: NEGATIVE

## 2021-01-14 LAB — URINALYSIS, ROUTINE W REFLEX MICROSCOPIC
Bilirubin Urine: NEGATIVE
Glucose, UA: NEGATIVE mg/dL
Hgb urine dipstick: NEGATIVE
Ketones, ur: NEGATIVE mg/dL
Leukocytes,Ua: NEGATIVE
Nitrite: NEGATIVE
Protein, ur: 30 mg/dL — AB
Specific Gravity, Urine: 1.02 (ref 1.005–1.030)
pH: 7 (ref 5.0–8.0)

## 2021-01-14 LAB — AMMONIA: Ammonia: 37 umol/L — ABNORMAL HIGH (ref 9–35)

## 2021-01-14 LAB — LACTIC ACID, PLASMA: Lactic Acid, Venous: 2.3 mmol/L (ref 0.5–1.9)

## 2021-01-14 IMAGING — CT CT HEAD W/O CM
4 series · 16 of 47 positions shown, 18 images · non-contrast
Comparison: Brain MRI and head CT [DATE]

CLINICAL DATA: Altered mental status, history of metastatic disease
in the brain

EXAM:
CT HEAD WITHOUT CONTRAST
TECHNIQUE: Contiguous axial images were obtained from the base of the skull
through the vertex without intravenous contrast.

[Series 2: head wo · axial · 0.42mm/px · z∈[+1264,+1374]mm · 7 of 30 slices shown, 9 images]
[im 4/30  brain]
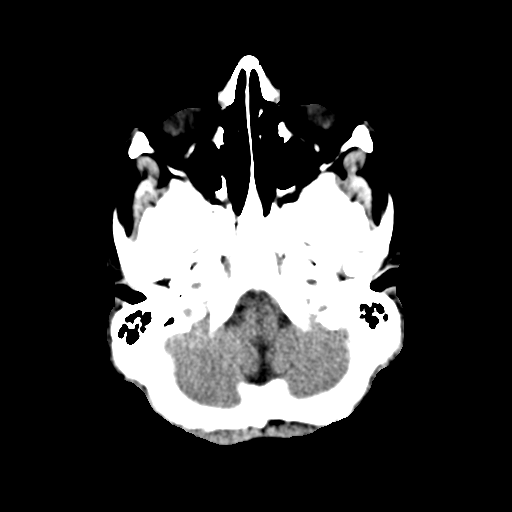
[im 4/30  bone]
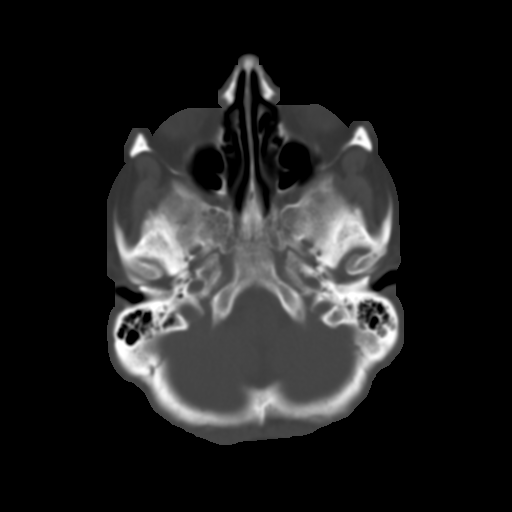
[im 8/30  brain]
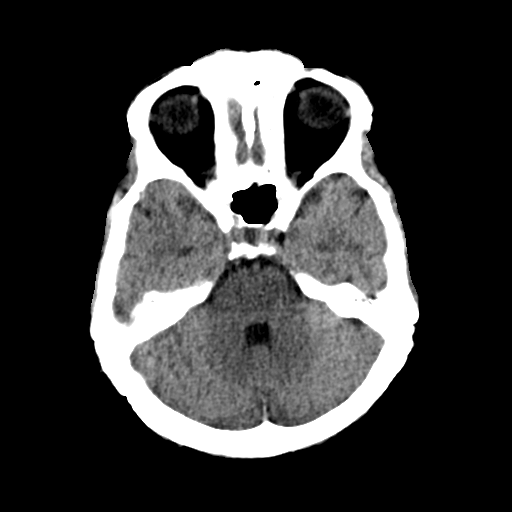
[im 11/30  brain]
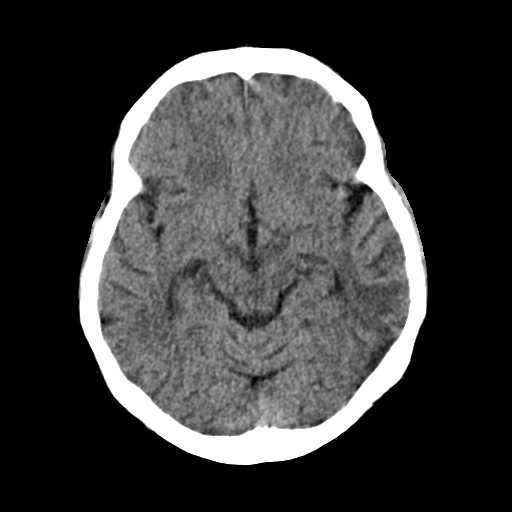
[im 15/30  brain]
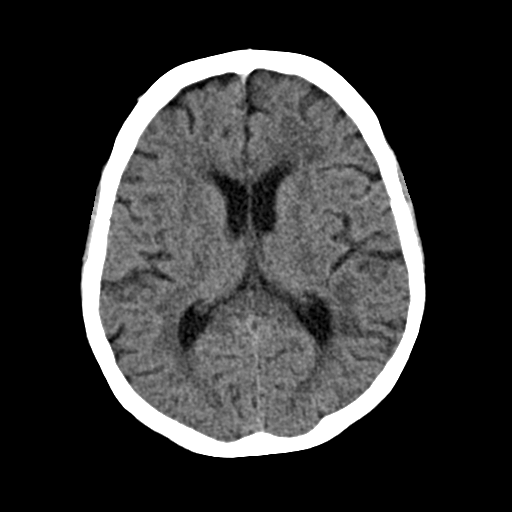
[im 19/30  brain]
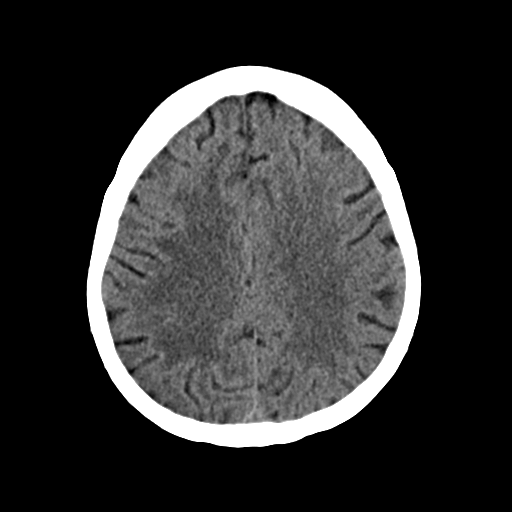
[im 19/30  bone]
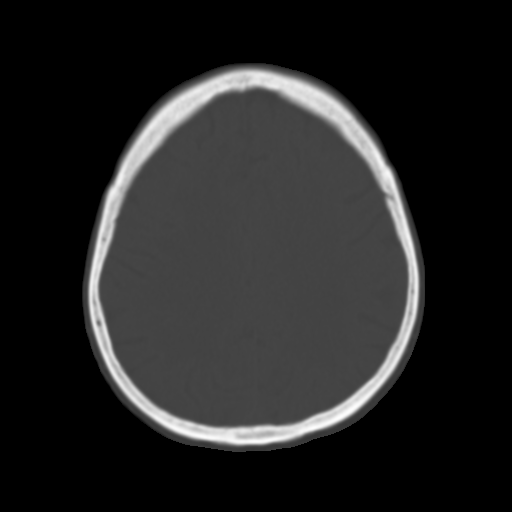
[im 22/30  brain]
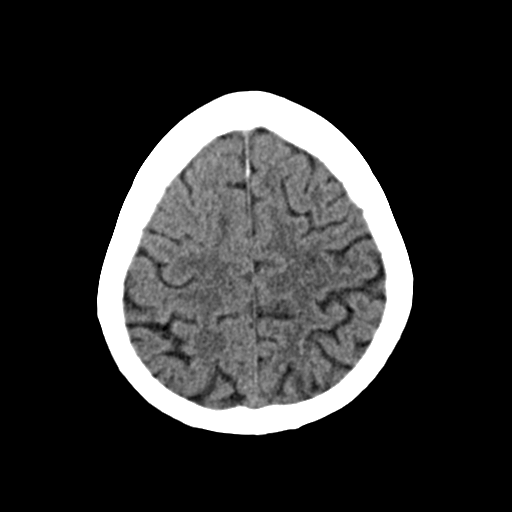
[im 26/30  brain]
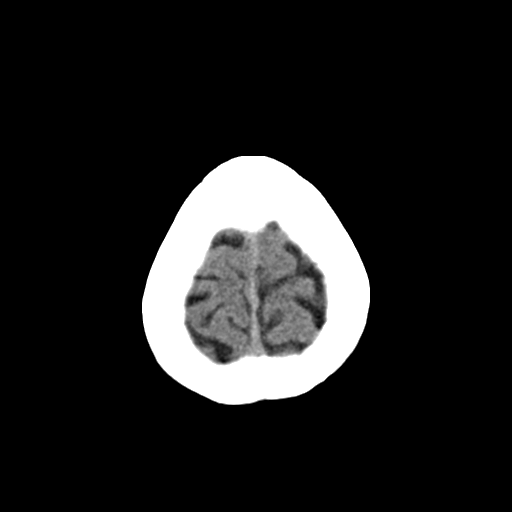

[Series 3: head bone · axial · 0.42mm/px · z∈[+1263,+1291]mm · 3 of 74 slices shown]
[im 8/74  bone]
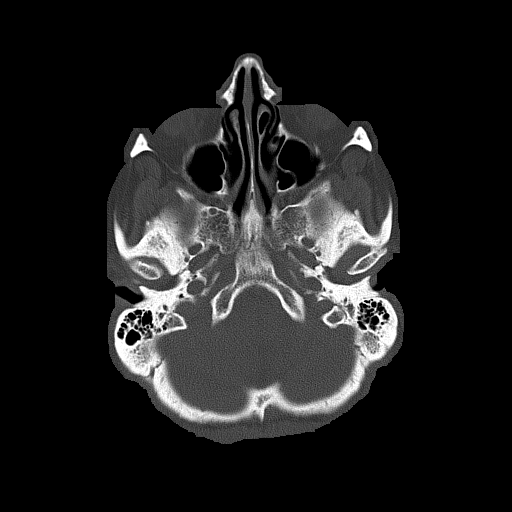
[im 15/74  bone]
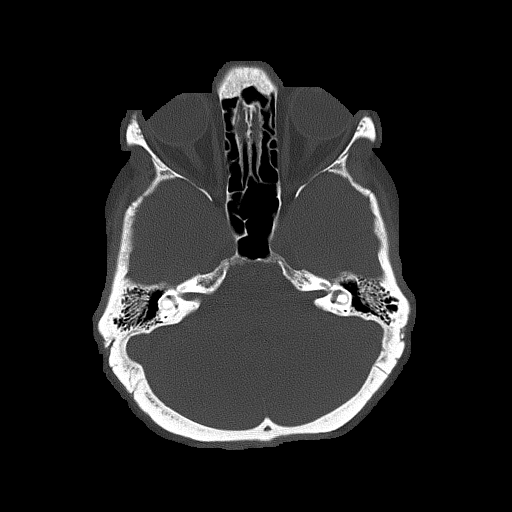
[im 22/74  bone]
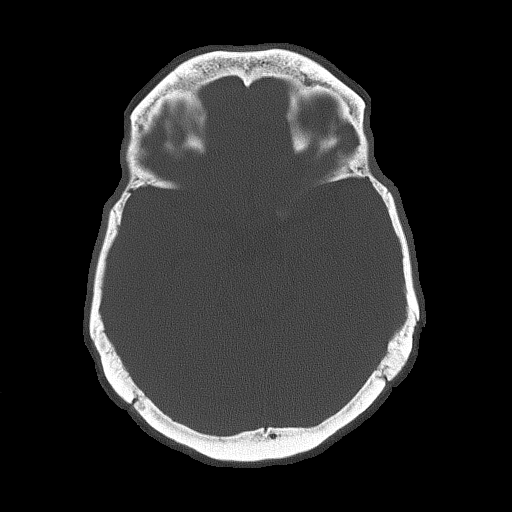

[Series 4: coronal soft · coronal · 0.29mm/px · 3 of 61 slices shown]
[im 21/61  brain]
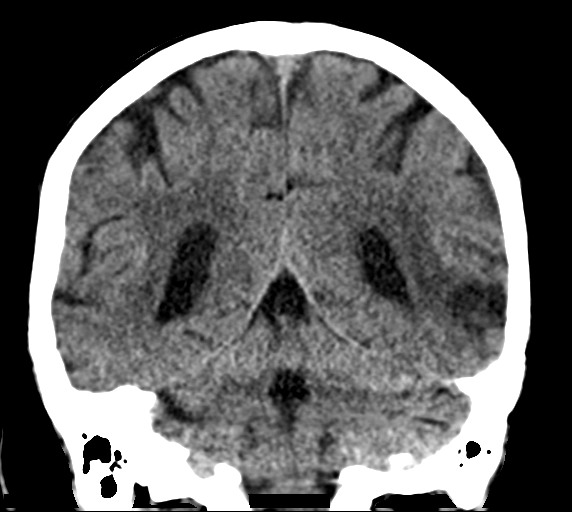
[im 27/61  brain]
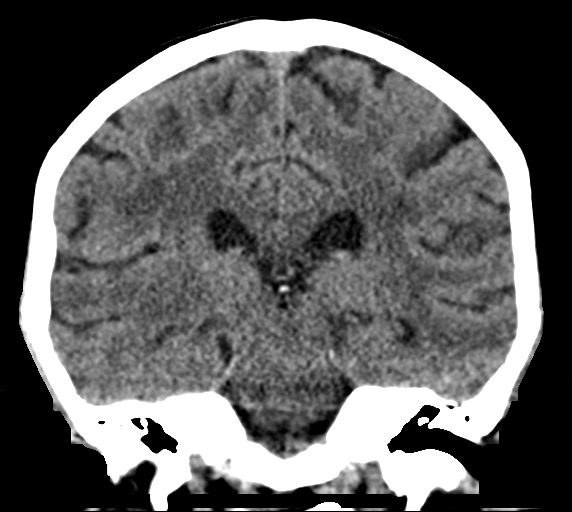
[im 34/61  brain]
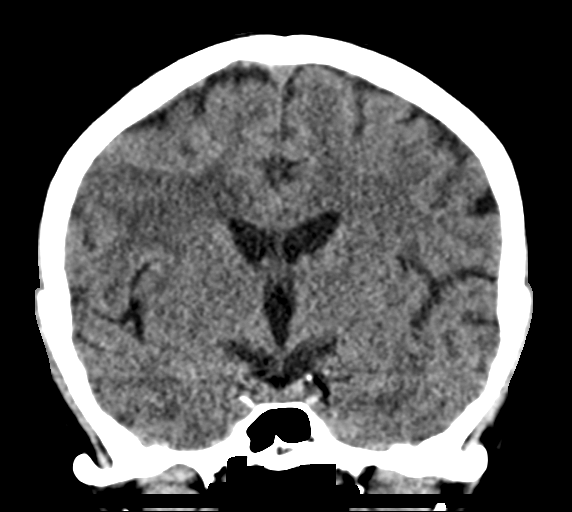

[Series 5: sag soft · sagittal · 0.29mm/px · 3 of 53 slices shown]
[im 18/53  brain]
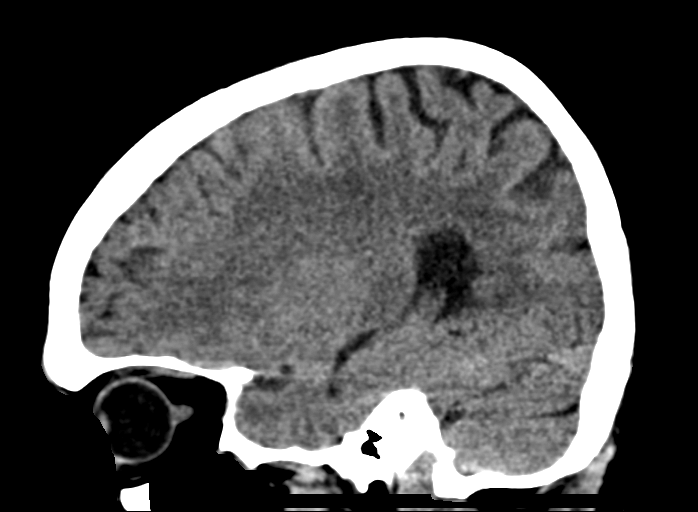
[im 27/53  brain]
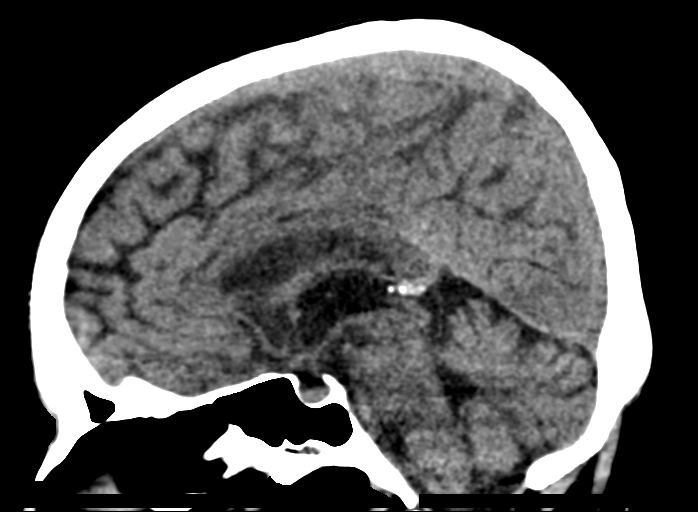
[im 35/53  brain]
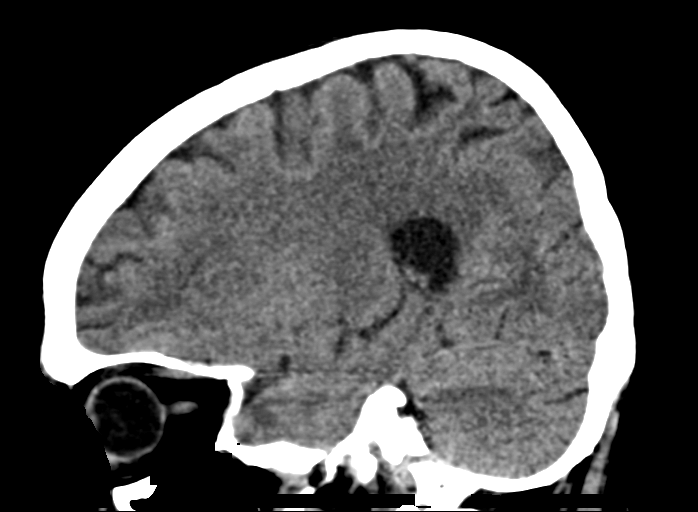

[16 of 47 positions shown; findings below may reference images not displayed]

FINDINGS: Brain: There is hypodensity in the left posterior temporal lobe and
parietal lobe consistent with known metastatic lesions with
surrounding vasogenic edema better evaluated on recent brain MRI.
Compared to the most recent head CT dated [DATE], the lesions
are decreased in density which may reflect evolving post treatment
change.

There is no evidence of acute territorial infarct, acute
intracranial hemorrhage, or extra-axial fluid collection. The
ventricles are stable in size. There is no midline shift.

Vascular: No hyperdense vessel or unexpected calcification.

Skull: Normal. Negative for fracture or focal lesion.

Sinuses/Orbits: The imaged paranasal sinuses are clear. The mastoid
air cells are clear. The globes and orbits are unremarkable.

Other: None.
IMPRESSION: 1. No evidence of acute intracranial hemorrhage, territorial
infarct, or extra-axial fluid collection.
2. Decreased density of the lesions in the left temporal and
parietal lobes which may reflect evolving post treatment change.
This could be further evaluated with MRI as indicated.

## 2021-01-14 IMAGING — CR DG CHEST 2V
2 series · 2 of 2 positions shown · non-contrast
Comparison: PET-CT [DATE]

CLINICAL DATA: Altered mental status.

EXAM:
CHEST - 2 VIEW

[w chest pa]
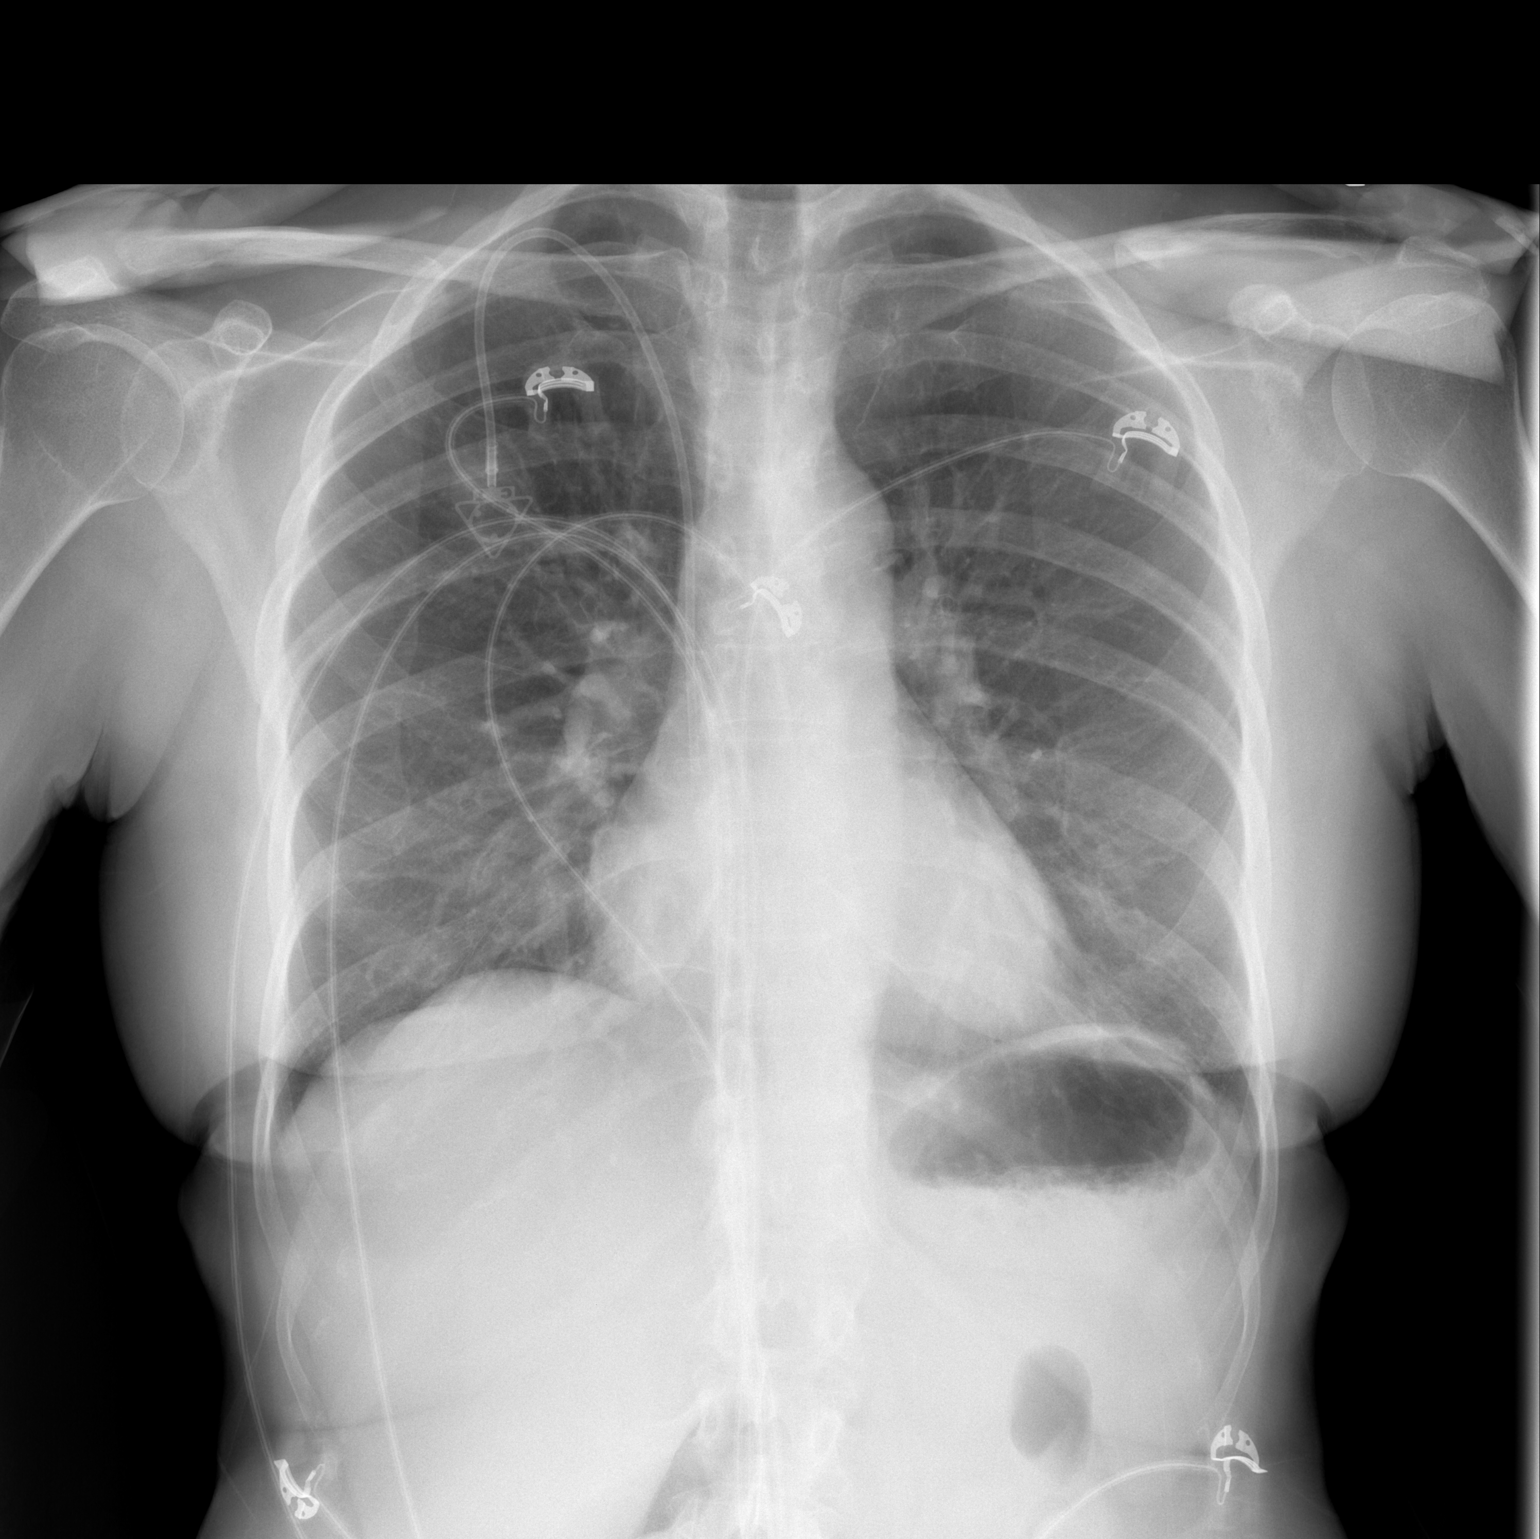

[w chest lat]
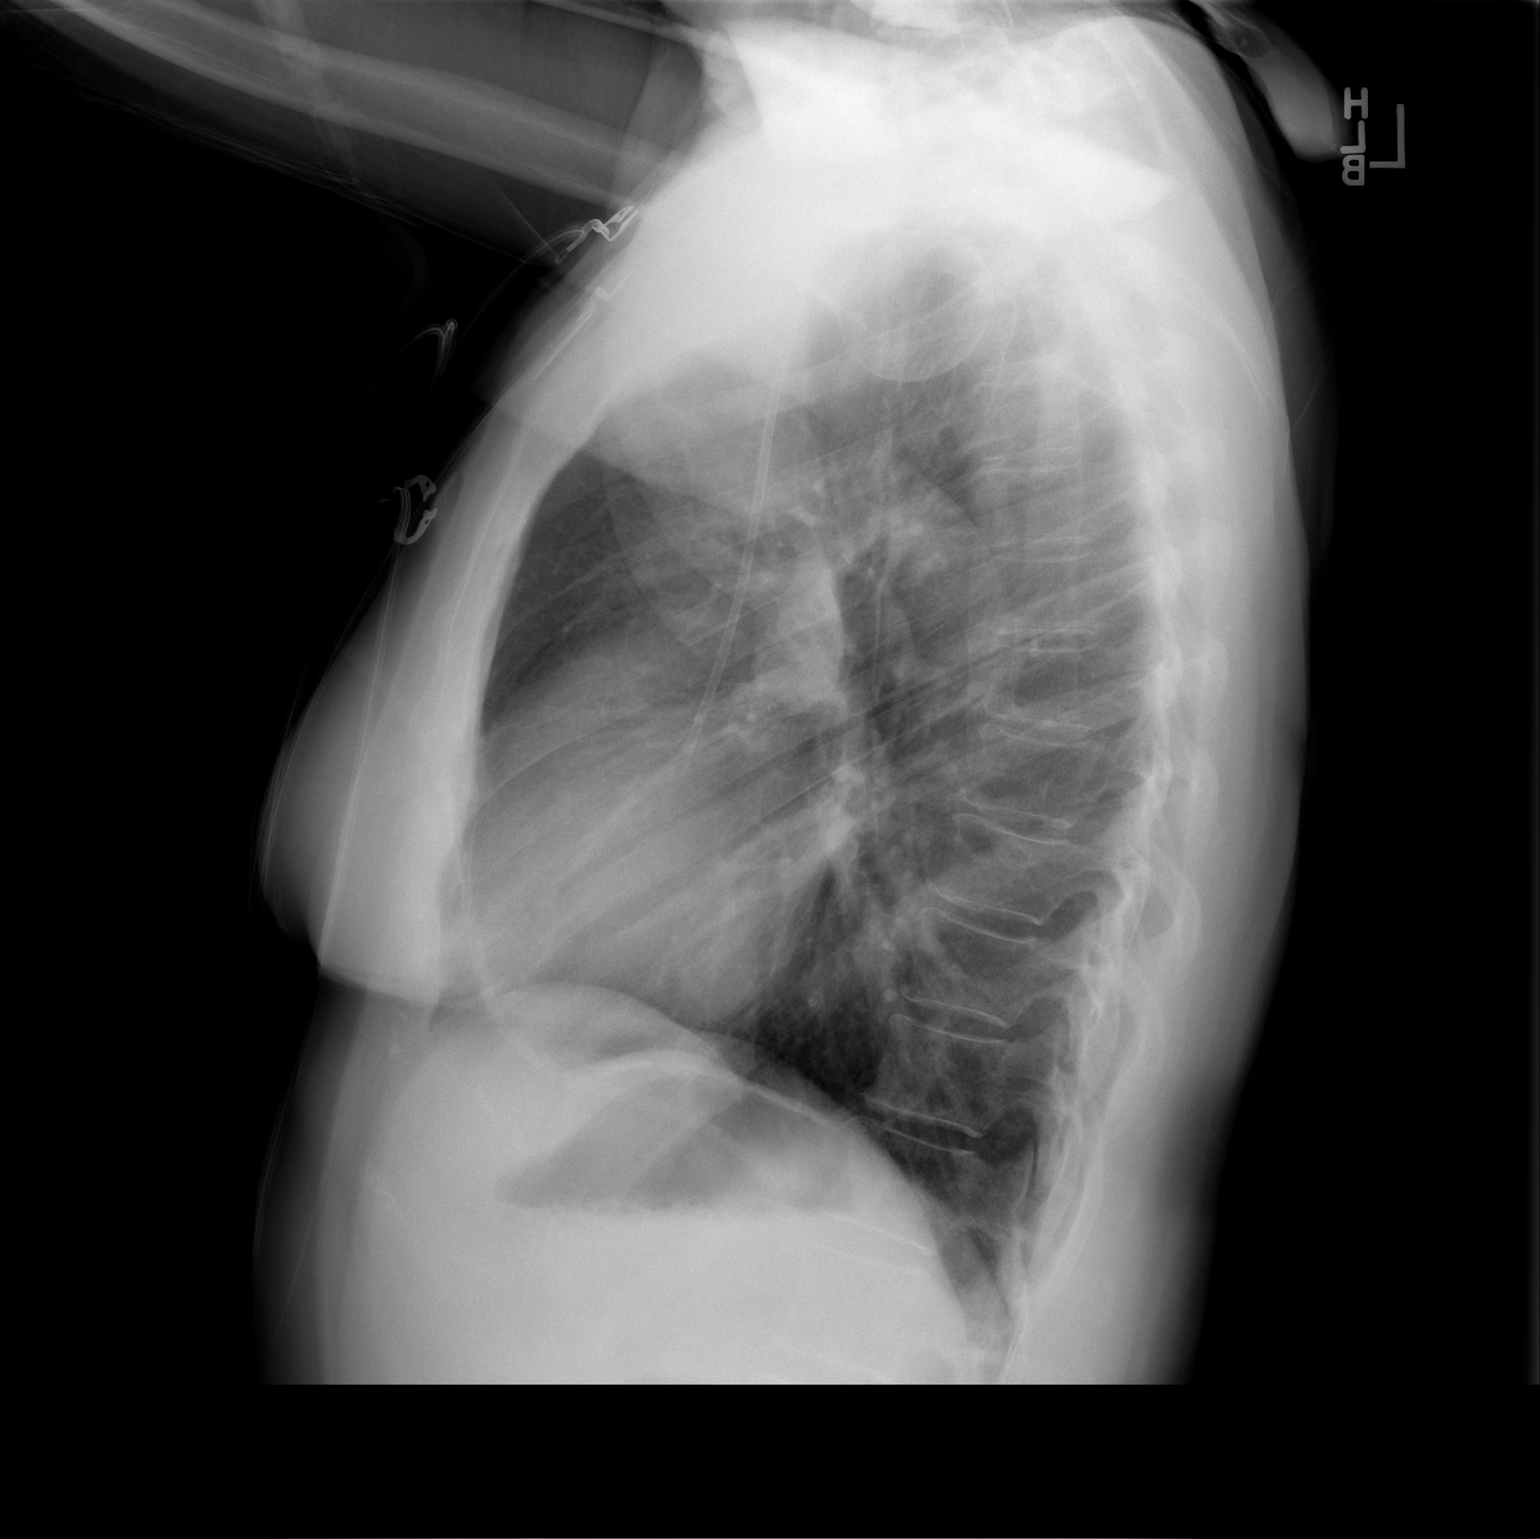

[2 of 2 positions shown; findings below may reference images not displayed]

FINDINGS: Right chest wall port a catheter is noted with tip in the projection
of the cavoatrial junction. The heart size and mediastinal contours
are within normal limits. Both lungs are clear. The visualized
skeletal structures are unremarkable.
IMPRESSION: No active cardiopulmonary disease.

## 2021-01-14 MED ORDER — ACETAMINOPHEN 325 MG PO TABS
650.0000 mg | ORAL_TABLET | Freq: Four times a day (QID) | ORAL | Status: DC | PRN
  Administered 2021-01-14 – 2021-01-30 (×6): 650 mg via ORAL
  Filled 2021-01-14 (×8): qty 2

## 2021-01-14 MED ORDER — FENTANYL CITRATE PF 50 MCG/ML IJ SOSY
50.0000 ug | PREFILLED_SYRINGE | Freq: Once | INTRAMUSCULAR | Status: AC
  Administered 2021-01-14: 50 ug via INTRAVENOUS
  Filled 2021-01-14: qty 1

## 2021-01-14 MED ORDER — ACETAMINOPHEN 325 MG PO TABS
650.0000 mg | ORAL_TABLET | Freq: Once | ORAL | Status: AC
  Administered 2021-01-14: 650 mg via ORAL
  Filled 2021-01-14: qty 2

## 2021-01-14 MED ORDER — ONDANSETRON HCL 4 MG PO TABS: 4.0000 mg | ORAL_TABLET | Freq: Four times a day (QID) | ORAL | Status: DC | PRN

## 2021-01-14 MED ORDER — DEXAMETHASONE 4 MG PO TABS: 4.0000 mg | ORAL_TABLET | Freq: Two times a day (BID) | ORAL | Status: DC

## 2021-01-14 MED ORDER — SODIUM CHLORIDE 0.9 % IV BOLUS
1000.0000 mL | Freq: Once | INTRAVENOUS | Status: AC
  Administered 2021-01-14: 1000 mL via INTRAVENOUS

## 2021-01-14 MED ORDER — ONDANSETRON HCL 4 MG/2ML IJ SOLN
4.0000 mg | Freq: Once | INTRAMUSCULAR | Status: AC
  Administered 2021-01-14: 4 mg via INTRAVENOUS
  Filled 2021-01-14: qty 2

## 2021-01-14 MED ORDER — FAMCICLOVIR 250 MG PO TABS: 125.0000 mg | ORAL_TABLET | Freq: Every day | ORAL | Status: DC

## 2021-01-14 MED ORDER — LEVETIRACETAM 500 MG PO TABS
500.0000 mg | ORAL_TABLET | Freq: Two times a day (BID) | ORAL | Status: DC
  Administered 2021-01-14 – 2021-01-21 (×15): 500 mg via ORAL
  Filled 2021-01-14 (×15): qty 1

## 2021-01-14 MED ORDER — ONDANSETRON HCL 4 MG/2ML IJ SOLN: 4.0000 mg | Freq: Four times a day (QID) | INTRAMUSCULAR | Status: DC | PRN

## 2021-01-14 MED ORDER — FLUCONAZOLE 100 MG PO TABS
100.0000 mg | ORAL_TABLET | Freq: Every day | ORAL | Status: DC
  Administered 2021-01-14 – 2021-01-21 (×8): 100 mg via ORAL
  Filled 2021-01-14 (×11): qty 1

## 2021-01-14 MED ORDER — ACETAMINOPHEN 650 MG RE SUPP: 650.0000 mg | Freq: Four times a day (QID) | RECTAL | Status: DC | PRN

## 2021-01-14 MED ORDER — POTASSIUM CHLORIDE IN NACL 20-0.9 MEQ/L-% IV SOLN
INTRAVENOUS | Status: DC
  Filled 2021-01-14 (×11): qty 1000

## 2021-01-14 MED ORDER — KETOROLAC TROMETHAMINE 15 MG/ML IJ SOLN
15.0000 mg | Freq: Once | INTRAMUSCULAR | Status: AC
  Administered 2021-01-14: 15 mg via INTRAVENOUS
  Filled 2021-01-14: qty 1

## 2021-01-14 MED ORDER — ACYCLOVIR 200 MG PO CAPS
200.0000 mg | ORAL_CAPSULE | Freq: Every day | ORAL | Status: DC
  Administered 2021-01-15 – 2021-02-08 (×25): 200 mg via ORAL
  Filled 2021-01-14 (×27): qty 1

## 2021-01-14 MED ORDER — DEXAMETHASONE 4 MG PO TABS
4.0000 mg | ORAL_TABLET | Freq: Every day | ORAL | Status: DC
  Administered 2021-01-15 – 2021-01-16 (×2): 4 mg via ORAL
  Filled 2021-01-14 (×2): qty 1

## 2021-01-14 MED ORDER — TEMAZEPAM 15 MG PO CAPS
15.0000 mg | ORAL_CAPSULE | Freq: Every evening | ORAL | Status: DC | PRN
  Administered 2021-01-14 – 2021-01-19 (×5): 15 mg via ORAL
  Filled 2021-01-14 (×5): qty 1

## 2021-01-14 MED ORDER — ALLOPURINOL 100 MG PO TABS: 100.0000 mg | ORAL_TABLET | Freq: Every day | ORAL | Status: DC

## 2021-01-14 MED ORDER — OLANZAPINE 5 MG PO TBDP
5.0000 mg | ORAL_TABLET | Freq: Every day | ORAL | Status: DC
  Administered 2021-01-14 – 2021-02-03 (×18): 5 mg via ORAL
  Filled 2021-01-14 (×29): qty 1

## 2021-01-14 MED ORDER — CIPROFLOXACIN HCL 500 MG PO TABS
500.0000 mg | ORAL_TABLET | Freq: Every day | ORAL | Status: DC
  Administered 2021-01-15 – 2021-01-16 (×2): 500 mg via ORAL
  Filled 2021-01-14 (×2): qty 1

## 2021-01-14 NOTE — ED Provider Notes (Signed)
[facility] EMERGENCY DEPARTMENT Provider Note   CSN: [id number] Arrival date & time: 01/14/21  1005     History Chief Complaint  Patient presents with   Altered Mental Status    Ariel [name] is a 65 y.o. 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  History is gathered from review of EMR, and patient's son at bedside.  Patient is agitated and angry and refusing to answer any questions.  Patient son states that about 12 weeks ago she was started on dexamethasone for brain metastases.  She began having behavior changes at that time became more irritable and angry.  Her behavior has slowly progressed and her behavior control has devolved.  This morning around 3:00 in the morning patient's son got a call from her caregiver because she was screaming, fighting and throwing things at her.  When he arrived she was acutely angry, manic and he fears for both the safety of the caregiver, himself and his mother.  He states that she has no history of psychiatric disorders.  Patient had an MRI of the brain 12 days ago that showed slight increase in the size of left temporal lobe lesion from 2 x 2 cm to 2.7 x 2.1 with mild vasogenic edema,No shift or signs of herniation.  Patient is currently under treatment with Dr. Marin Olp.  Patient is complaining of pain in her back where she has known renal mets.  She states that she does not want any pain medicine.  She is also complaining of pain in her right eye.  She states that she just had surgery on her eye.  Her son says that this is untrue.   Altered Mental Status     Past Medical History:  Diagnosis Date   Chicken pox    Complication of anesthesia    Per patient, very slow to wake up after anesthesia   Goals of care, counseling/discussion 12/22/2020   High cholesterol    High grade  B-cell lymphoma ([place]) 12/22/2020   Hyperglycemia 11/10/2020   Melanoma ([place]) 2021   right hip   PONV (postoperative nausea and vomiting)    Urinary incontinence     Patient Active Problem List   Diagnosis Date Noted   High grade B-cell lymphoma ([place]) 12/22/2020   Goals of care, counseling/discussion 19/41/7408   Acute metabolic encephalopathy 14/48/1856   Brain metastasis ([place]) 12/09/2020   Brain tumor ([place]) 12/08/2020   Hyponatremia 12/08/2020   Night sweat 11/30/2020   Chicken pox 31/49/7026   Complication of anesthesia 11/22/2020   PONV (postoperative nausea and vomiting) 11/22/2020   Skin rash 11/10/2020   Tachycardia 11/10/2020   Anemia 11/10/2020   Hyperglycemia 11/10/2020   TIA (transient ischemic attack) 10/07/2020   Iron deficiency anemia 10/07/2020   Aphasia 10/06/2020   Melanoma ([place]) 2021   Multinodular goiter 12/22/2013   Subclinical hyperthyroidism 12/19/2013   Urinary incontinence 11/19/2013   Hyperlipidemia 11/19/2012   Routine general medical examination at a health care facility 11/18/2012    Past Surgical History:  Procedure Laterality Date   AIR/FLUID EXCHANGE Right 12/07/2020   Procedure: AIR/FLUID EXCHANGE;  Surgeon: Jalene Mullet, MD;  Location: [facility];  Service: Ophthalmology;  Laterality: Right;   APPENDECTOMY  1962   BIOPSY  12/14/2020   Procedure: BIOPSY;  Surgeon: Rush Landmark Telford Nab., MD;  Location: [facility];  Service: Gastroenterology;;  ESOPHAGOGASTRODUODENOSCOPY (EGD) WITH PROPOFOL N/A 12/14/2020   Procedure: ESOPHAGOGASTRODUODENOSCOPY (EGD) WITH PROPOFOL;  Surgeon: Rush Landmark Telford Nab., MD;  Location: [facility];  Service: Gastroenterology;  Laterality: N/A;   EXCISION MELANOMA WITH SENTINEL LYMPH NODE BIOPSY Right 10/09/2019   Procedure: WIDE LOCAL EXCISION WITH ADVANCEMENT FLAP CLOSURE RIGHT HIP MELANOMA WITH SENTINEL LYMPH NODE BIOPSY;  Surgeon: Stark Klein, MD;  Location: [facility];  Service: General;  Laterality: Right;    FINE NEEDLE ASPIRATION  12/14/2020   Procedure: FINE NEEDLE ASPIRATION (FNA) LINEAR;  Surgeon: Irving Copas., MD;  Location: [facility];  Service: Gastroenterology;;   HERNIA REPAIR  2009   IR IMAGING GUIDED PORT INSERTION  12/30/2020   MOUTH SURGERY  11/2015   gum surgery and bone implant   PARS PLANA VITRECTOMY Right 12/07/2020   Procedure: PARS PLANA VITRECTOMY WITH 25 GAUGE;  Surgeon: Jalene Mullet, MD;  Location: [facility];  Service: Ophthalmology;  Laterality: Right;   PHOTOCOAGULATION WITH LASER Right 12/07/2020   Procedure: PHOTOCOAGULATION WITH ENDOLASER PANRITINAL COAGULATION;  Surgeon: Jalene Mullet, MD;  Location: [facility];  Service: Ophthalmology;  Laterality: Right;   UPPER ESOPHAGEAL ENDOSCOPIC ULTRASOUND (EUS) N/A 12/14/2020   Procedure: UPPER ESOPHAGEAL ENDOSCOPIC ULTRASOUND (EUS);  Surgeon: Irving Copas., MD;  Location: [facility];  Service: Gastroenterology;  Laterality: N/A;   WISDOM TOOTH EXTRACTION       OB History     Gravida  2   Para  2   Term      Preterm      AB      Living  2      SAB      IAB      Ectopic      Multiple      Live Births              Family History  Problem Relation Age of Onset   Coronary artery disease Father        died at 66   COPD Father    Alcohol abuse Father    Diabetes Mellitus II Father    Coronary artery disease Sister    Cancer Sister        breast   Heart attack Brother    Hyperlipidemia Other        died 85- "natural causes"   Hyperlipidemia Other        4 siblings    Hypertension Other        3 siblings    Social History   Tobacco Use   Smoking status: Never   Smokeless tobacco: Never  Vaping Use   Vaping Use: Never used  Substance Use Topics   Alcohol use: Never   Drug use: Never    Home Medications Prior to Admission medications   Medication Sig Start Date End Date Taking? Authorizing Provider  allopurinol (ZYLOPRIM) 100 MG tablet Take 1 tablet (100 mg  total) by mouth daily. Start taking for 1 month only on 01/01/2021 12/28/20   Volanda Napoleon, MD  ciprofloxacin (CIPRO) 500 MG tablet Take 1 tablet (500 mg total) by mouth daily with breakfast. Start taking day AFTER chemo finishes for 15 days!!  You will do this with each cycle of chemotherapy!! Per Dr Jonette Eva 01/09/21   Brunetta Genera, MD  dexamethasone (DECADRON) 4 MG tablet Take 1 tablet (4 mg total) by mouth 2 (two) times daily. 01/13/21   Volanda Napoleon, MD  famciclovir (FAMVIR) 250 MG tablet Take 1 tablet (250 mg  total) by mouth daily. 12/28/20   Volanda Napoleon, MD  fluconazole (DIFLUCAN) 100 MG tablet Take 1 tablet (100 mg total) by mouth daily. 01/06/21   Volanda Napoleon, MD  levETIRAcetam (KEPPRA) 500 MG tablet Take 1 tablet (500 mg total) by mouth 2 (two) times daily. 12/16/20 03/16/21  Charlynne Cousins, MD  lidocaine-prilocaine (EMLA) cream Apply to affected area once 01/03/21   Volanda Napoleon, MD  ondansetron (ZOFRAN) 8 MG tablet Take 1 tablet (8 mg total) by mouth 2 (two) times daily as needed for refractory nausea / vomiting. Start 2 days after chemotherapy. 01/03/21   Volanda Napoleon, MD  prochlorperazine (COMPAZINE) 10 MG tablet Take 1 tablet (10 mg total) by mouth every 6 (six) hours as needed (Nausea or vomiting). 01/03/21   Volanda Napoleon, MD  temazepam (RESTORIL) 15 MG capsule Take 1 capsule (15 mg total) by mouth at bedtime as needed for sleep. 01/05/21   Volanda Napoleon, MD    Allergies    Codeine and Plavix [clopidogrel]  Review of Systems   Review of Systems  Unable to perform ROS: Mental status change   Physical Exam Updated Vital Signs BP 96/67 (BP Location: Left Arm)   Pulse 82   Temp (!) 97.5 F (36.4 C) (Oral)   Resp 18   SpO2 100%   Physical Exam Vitals and nursing note reviewed.  Constitutional:      General: She is not in acute distress.    Appearance: She is well-developed. She is not diaphoretic.  HENT:     Head: Normocephalic and  atraumatic.     Right Ear: External ear normal.     Left Ear: External ear normal.     Nose: Nose normal.     Mouth/Throat:     Mouth: Mucous membranes are moist.  Eyes:     General: No scleral icterus.    Conjunctiva/sclera: Conjunctivae normal.     Comments: Anisocoria   R pupil 35mm fixed Left pupil 72mm reactive  Cardiovascular:     Rate and Rhythm: Normal rate and regular rhythm.     Heart sounds: Normal heart sounds. No murmur heard.   No friction rub. No gallop.  Pulmonary:     Effort: Pulmonary effort is normal. No respiratory distress.     Breath sounds: Normal breath sounds.  Abdominal:     General: Bowel sounds are normal. There is no distension.     Palpations: Abdomen is soft. There is no mass.     Tenderness: There is no abdominal tenderness. There is no guarding.  Musculoskeletal:     Cervical back: Normal range of motion.  Skin:    General: Skin is warm and dry.  Neurological:     Mental Status: She is alert and oriented to person, place, and time.  Psychiatric:        Behavior: Behavior normal.    ED Results / Procedures / Treatments   Labs (all labs ordered are listed, but only abnormal results are displayed) Labs Reviewed - No data to display  EKG None  Radiology No results found.  Procedures Procedures   Medications Ordered in ED Medications  OLANZapine zydis (ZYPREXA) disintegrating tablet 5 mg (5 mg Oral Given 01/14/21 1100)  acetaminophen (TYLENOL) tablet 650 mg (650 mg Oral Given 01/14/21 1100)    ED Course  I have reviewed the triage vital signs and the nursing notes.  Pertinent labs & imaging results that were available during my care of  the patient were reviewed by me and considered in my medical decision making (see chart for details).    MDM Rules/Calculators/A&P                         65 year old female who presents with abnormal behavior and mental status change. The differential diagnosis for AMS is extensive and includes,  but is not limited to: drug overdose - opioids, alcohol, sedatives, antipsychotics, drug withdrawal, others; Metabolic: hypoxia, hypoglycemia, hyperglycemia, hypercalcemia, hypernatremia, hyponatremia, uremia, hepatic encephalopathy, hypothyroidism, hyperthyroidism, vitamin B12 or thiamine deficiency, carbon monoxide poisoning, Wilson's disease, Lactic acidosis, ; Infectious: meningitis, encephalitis, bacteremia/sepsis, urinary tract infection, pneumonia, neurosyphilis; Structural: Space-occupying lesion, (brain tumor, subdural hematoma, hydrocephalus,); Vascular: stroke, subarachnoid hemorrhage, coronary ischemia, hypertensive encephalopathy, CNS vasculitis, thrombotic thrombocytopenic purpura, disseminated intravascular coagulation, hyperviscosity; Psychiatric: Schizophrenia, depression; Other: Seizure, hypothermia, heat stroke, ICU psychosis, dementia -"sundowning."  Patient has known metastatic lesions to the brain.  I discussed the case with Dr. Marin Olp of oncology.  We reviewed the patient's work-up including her labs which shows leukopenia with associated neutropenia, mildly elevated lactic acid likely due to some mild dehydration, negative urinalysis, ammonia level only slightly elevated, respiratory panel shows no COVID or influenza. Patient does not have any significant hyponatremia. I have suspicion that this may be secondary to her Decadron use.  She was given Zyprexa with an rapid improvement in her demeanor.  Dr. Marin Olp also states that this may be secondary to some of the chemotherapeutic agents she has received.  He recommends that she comes into the hospital for observation.    I ordered and reviewed a head CT which shows decreased density of the lesions in the patient's brain since last MRI.  I discussed the case with Dr. [place] Crane who will admit the patient to the hospitalist service.  She is asked that I add on a chest CT which is pending.  Complaining of back pain likely secondary to her  metastases.  I have ordered fentanyl every 2 hours which has been very helpful for her pain and agitation.  Patient stable for transfer to skilled for admission Final Clinical Impression(s) / ED Diagnoses Final diagnoses:  None    Rx / DC Orders ED Discharge Orders     None        Margarita Mail, PA-C 01/14/21 1953    Drenda Freeze, MD 01/15/21 [phone]

## 2021-01-14 NOTE — H&P (Signed)
History and Physical    Declynn Lopresti JJH:[id number] DOB: 12-Feb-1956 DOA: 01/14/2021  PCP: [name], NP   Patient coming from: Home.  I have personally briefly reviewed patient's old medical records in [place]  Chief Complaint: AMS.  HPI: Jamaia Brum is a 65 year old female with history of metastatic B-cell lymphoma with brain mets who presented from home due to progressively worsening altered mental status.  Patient was started on Decadron 12 weeks ago and has been having decline of her mentation.  She is currently on Decadron 5 mg twice daily.  For the past 24 hours she has had severe behavioral changes and aggressive behavior at home.  She was brought into the ED for further evaluation.  Upon presentation to the ED she was agitated and received a dose of Zyprexa with improvement.  EDP contacted Dr. Marin Olp, her medical oncologist, who recommended admission and observe for 48 hours.  When she was evaluated, her mental status was better, but she seemed to be unable to focus.  She denied headache, chest pain or abdominal pain but was complaining of lower back pain.  Denied rhinorrhea, sore throat, productive cough, wheezing or hemoptysis.  No lower extremity edema.  No dysuria, frequency or hematuria.  No polyuria, polydipsia, polyphagia or blurred vision.  ED Course: Initial vital signs temperature 97.5 F, pulse 82, respiration 18, BP 96/67 mmHg O2 sat 100% on room air.  Patient was given Cipro + IV fluids in the emergency department.  Lab work: Urinalysis had a few bacteria but was otherwise unremarkable.  Coronavirus and influenza PCR was negative.  CBC showed a white count of 1.3 with 19% neutrophils, hemoglobin 11.1 g/dL and platelets 142.  Ammonia level was 37 mol/L.  Lactic acid 2.3 mmol/L.  CMP showed a sodium 127, potassium 3.5, chloride 92 and CO2 27 mmol/L.  Renal function was normal.  Total protein 6.3, albumin 3.1 g/dL.  ALT was slightly elevated at 55.  The rest  of the LFTs were normal.  Imaging: Her chest x-ray was normal.  No acute intracranial abnormalities seen on head CT.  There were these increased density of the lesions in the left temporal and parietal lobes.  Please see images and full daily report for further detail.  Review of Systems: As per HPI otherwise all other systems reviewed and are negative.  Past Medical History:  Diagnosis Date   Chicken pox    Complication of anesthesia    Per patient, very slow to wake up after anesthesia   Goals of care, counseling/discussion 12/22/2020   High cholesterol    High grade B-cell lymphoma ([place]) 12/22/2020   Hyperglycemia 11/10/2020   Melanoma ([place]) 2021   right hip   PONV (postoperative nausea and vomiting)    Urinary incontinence    Past Surgical History:  Procedure Laterality Date   AIR/FLUID EXCHANGE Right 12/07/2020   Procedure: AIR/FLUID EXCHANGE;  Surgeon: Jalene Mullet, MD;  Location: [facility];  Service: Ophthalmology;  Laterality: Right;   APPENDECTOMY  1962   BIOPSY  12/14/2020   Procedure: BIOPSY;  Surgeon: Rush Landmark Telford Nab., MD;  Location: [facility];  Service: Gastroenterology;;   ESOPHAGOGASTRODUODENOSCOPY (EGD) WITH PROPOFOL N/A 12/14/2020   Procedure: ESOPHAGOGASTRODUODENOSCOPY (EGD) WITH PROPOFOL;  Surgeon: Irving Copas., MD;  Location: [place];  Service: Gastroenterology;  Laterality: N/A;   EXCISION MELANOMA WITH SENTINEL LYMPH NODE BIOPSY Right 10/09/2019   Procedure: WIDE LOCAL EXCISION WITH ADVANCEMENT FLAP CLOSURE RIGHT HIP MELANOMA WITH SENTINEL LYMPH NODE BIOPSY;  Surgeon:  Stark Klein, MD;  Location: [place];  Service: General;  Laterality: Right;   FINE NEEDLE ASPIRATION  12/14/2020   Procedure: FINE NEEDLE ASPIRATION (FNA) LINEAR;  Surgeon: Irving Copas., MD;  Location: [facility];  Service: Gastroenterology;;   HERNIA REPAIR  2009   IR IMAGING GUIDED PORT INSERTION  12/30/2020   MOUTH SURGERY  11/2015   gum surgery and bone  implant   PARS PLANA VITRECTOMY Right 12/07/2020   Procedure: PARS PLANA VITRECTOMY WITH 25 GAUGE;  Surgeon: Jalene Mullet, MD;  Location: [facility];  Service: Ophthalmology;  Laterality: Right;   PHOTOCOAGULATION WITH LASER Right 12/07/2020   Procedure: PHOTOCOAGULATION WITH ENDOLASER PANRITINAL COAGULATION;  Surgeon: Jalene Mullet, MD;  Location: [facility];  Service: Ophthalmology;  Laterality: Right;   UPPER ESOPHAGEAL ENDOSCOPIC ULTRASOUND (EUS) N/A 12/14/2020   Procedure: UPPER ESOPHAGEAL ENDOSCOPIC ULTRASOUND (EUS);  Surgeon: Irving Copas., MD;  Location: [place];  Service: Gastroenterology;  Laterality: N/A;   WISDOM TOOTH EXTRACTION     Social History  reports that she has never smoked. She has never used smokeless tobacco. She reports that she does not drink alcohol and does not use drugs.  Allergies  Allergen Reactions   Codeine Nausea Only   Plavix [Clopidogrel]     Hives,lip swelling    Family History  Problem Relation Age of Onset   Coronary artery disease Father        died at 38   COPD Father    Alcohol abuse Father    Diabetes Mellitus II Father    Coronary artery disease Sister    Cancer Sister        breast   Heart attack Brother    Hyperlipidemia Other        died 70- "natural causes"   Hyperlipidemia Other        4 siblings    Hypertension Other        3 siblings   Prior to Admission medications   Medication Sig Start Date End Date Taking? Authorizing Provider  allopurinol (ZYLOPRIM) 100 MG tablet Take 1 tablet (100 mg total) by mouth daily. Start taking for 1 month only on 01/01/2021 12/28/20   Volanda Napoleon, MD  ciprofloxacin (CIPRO) 500 MG tablet Take 1 tablet (500 mg total) by mouth daily with breakfast. Start taking day AFTER chemo finishes for 15 days!!  You will do this with each cycle of chemotherapy!! Per Dr Jonette Eva 01/09/21   Brunetta Genera, MD  dexamethasone (DECADRON) 4 MG tablet Take 1 tablet (4 mg total) by mouth 2 (two)  times daily. 01/13/21   Volanda Napoleon, MD  famciclovir (FAMVIR) 250 MG tablet Take 1 tablet (250 mg total) by mouth daily. 12/28/20   Volanda Napoleon, MD  fluconazole (DIFLUCAN) 100 MG tablet Take 1 tablet (100 mg total) by mouth daily. 01/06/21   Volanda Napoleon, MD  levETIRAcetam (KEPPRA) 500 MG tablet Take 1 tablet (500 mg total) by mouth 2 (two) times daily. 12/16/20 03/16/21  Charlynne Cousins, MD  lidocaine-prilocaine (EMLA) cream Apply to affected area once 01/03/21   Volanda Napoleon, MD  ondansetron (ZOFRAN) 8 MG tablet Take 1 tablet (8 mg total) by mouth 2 (two) times daily as needed for refractory nausea / vomiting. Start 2 days after chemotherapy. 01/03/21   Volanda Napoleon, MD  prochlorperazine (COMPAZINE) 10 MG tablet Take 1 tablet (10 mg total) by mouth every 6 (six) hours as needed (Nausea or vomiting). 01/03/21  Volanda Napoleon, MD  temazepam (RESTORIL) 15 MG capsule Take 1 capsule (15 mg total) by mouth at bedtime as needed for sleep. 01/05/21   Volanda Napoleon, MD   Physical Exam: Vitals:   01/14/21 1300 01/14/21 1500 01/14/21 1530 01/14/21 1625  BP: 112/60 125/61 115/67 136/68  Pulse: 85 73  78  Resp: 16 14 14 20   Temp:    98 F (36.7 C)  TempSrc:    Oral  SpO2: 99% 100% 100% 99%   Constitutional: NAD, calm, comfortable Eyes: Positive anisocoria.  R pupil is fixed and left pupil is reactive.  Lids and conjunctivae normal ENMT: Mucous membranes are moist. Posterior pharynx clear of any exudate or lesions. Neck: normal, supple, no masses, no thyromegaly Respiratory: clear to auscultation bilaterally, no wheezing, no crackles. Normal respiratory effort. No accessory muscle use.  Cardiovascular: Regular rate and rhythm, no murmurs / rubs / gallops. No extremity edema. 2+ pedal pulses. No carotid bruits.  Abdomen: No distention.  Bowel sounds positive.  Soft, no tenderness, no masses palpated. No hepatosplenomegaly.  Musculoskeletal: no clubbing / cyanosis. Good ROM,  no contractures. Normal muscle tone.  Skin: no rashes, lesions, ulcers on very limited otological examination. Neurologic: CN 2-12 grossly intact. Sensation intact, DTR normal. Strength 5/5 in all 4.  Psychiatric: Speech seems pressured at times.  Alert and oriented x 3, disoriented to situation and reason why she is in the hospital.  Mildly anxious mood.  Labs on Admission: I have personally reviewed following labs and imaging studies  CBC: Recent Labs  Lab 01/13/21 1029 01/14/21 1148  WBC 0.8* 1.3*  NEUTROABS 0.4* 0.3*  HGB 10.8* 11.1*  HCT 32.9* 33.9*  MCV 75.8* 75.3*  PLT 163 546*   Basic Metabolic Panel: Recent Labs  Lab 01/13/21 1029 01/14/21 1148  NA 130* 127*  K 4.0 3.5  CL 93* 92*  CO2 32 27  GLUCOSE 157* 122*  BUN 14 15  CREATININE 0.48 0.44  CALCIUM 9.6 9.1    GFR: CrCl cannot be calculated (Unknown ideal weight.).  Liver Function Tests: Recent Labs  Lab 01/13/21 1029 01/14/21 1148  AST 18 26  ALT 56* 55*  ALKPHOS 72 72  BILITOT 0.4 1.0  PROT 6.6 6.3*  ALBUMIN 3.5 3.1*    Urine analysis:    Component Value Date/Time   COLORURINE YELLOW 01/14/2021 [place] 01/14/2021 1139   LABSPEC 1.020 01/14/2021 1139   PHURINE 7.0 01/14/2021 1139   GLUCOSEU NEGATIVE 01/14/2021 1139        HGBUR NEGATIVE 01/14/2021 [place] 01/14/2021 [place] 01/14/2021 1139   PROTEINUR 30 (A) 01/14/2021 1139        NITRITE NEGATIVE 01/14/2021 1139   LEUKOCYTESUR NEGATIVE 01/14/2021 1139   Radiological Exams on Admission: DG Chest 2 View  Result Date: 01/14/2021 CLINICAL DATA:  Altered mental status. EXAM: CHEST - 2 VIEW COMPARISON:  PET-CT 12/27/2020 FINDINGS: Right chest wall port a catheter is noted with tip in the projection of the cavoatrial junction. The heart size and mediastinal contours are within normal limits. Both lungs are clear. The visualized skeletal structures are unremarkable. IMPRESSION: No  active cardiopulmonary disease. Electronically Signed   By: Kerby Moors M.D.   On: 01/14/2021 14:36   CT Head Wo Contrast  Result Date: 01/14/2021 CLINICAL DATA:  Altered mental status, history of metastatic disease in the brain EXAM: CT HEAD WITHOUT CONTRAST TECHNIQUE: Contiguous axial  images were obtained from the base of the skull through the vertex without intravenous contrast. COMPARISON:  Brain MRI and head CT 01/02/2021 FINDINGS: Brain: There is hypodensity in the left posterior temporal lobe and parietal lobe consistent with known metastatic lesions with surrounding vasogenic edema better evaluated on recent brain MRI. Compared to the most recent head CT dated 01/02/2021, the lesions are decreased in density which may reflect evolving post treatment change. There is no evidence of acute territorial infarct, acute intracranial hemorrhage, or extra-axial fluid collection. The ventricles are stable in size. There is no midline shift. Vascular: No hyperdense vessel or unexpected calcification. Skull: Normal. Negative for fracture or focal lesion. Sinuses/Orbits: The imaged paranasal sinuses are clear. The mastoid air cells are clear. The globes and orbits are unremarkable. Other: None. IMPRESSION: 1. No evidence of acute intracranial hemorrhage, territorial infarct, or extra-axial fluid collection. 2. Decreased density of the lesions in the left temporal and parietal lobes which may reflect evolving post treatment change. This could be further evaluated with MRI as indicated. Electronically Signed   By: Valetta Mole M.D.   On: 01/14/2021 13:04    EKG: Independently reviewed.    Assessment/Plan Principal Problem:   AMS (altered mental status) In the setting of   Acute metabolic encephalopathy Inpatient/telemetry. Continue IV fluids. Taper glucocorticoids if possible. Continue Zyprexa 5 mg at bedtime. If needed begin anxiolytic.  Recheck ammonia level in AM. Consider behavioral health  evaluation.  Active Problems:   Hyponatremia Continue normal saline infusion. Check urine osmolality. Check urine sodium.    Brain metastasis ([place]) Continue but try to taper dexamethasone.    High grade B-cell lymphoma (H[facility])   Neutropenia ([facility]) Neutropenic precautions. Begin antibiotics if she becomes febrile. Follow-up WBC. Oncology to evaluate tomorrow.    Iron deficiency anemia Monitor hematocrit and hemoglobin. Iron supplementation.    Hyperlipidemia Currently not on medical therapy.    DVT prophylaxis: Lovenox SQ. Code Status:   Full code. Family Communication:   Disposition Plan:   Patient is from:  Home.  Anticipated DC to:  Home.  Anticipated DC date:  01/15/2021 or 01/16/2021.  Anticipated DC barriers: Clinical status.  Consults called:  Message sent to Dr. Lorenso Courier. Admission status:  Inpatient/telemetry.  High severity after presenting with acute metabolic encephalopathy in the setting of brain metastasis and recent dexamethasone use.  Severity of Illness:  Reubin Milan MD [facility]  How to contact the [facility] Attending or Consulting provider [place] or covering provider during after hours [place], for this patient?   Check the care team in [facility] and look for a) attending/consulting [facility] provider listed and b) the [facility] team listed Log into [email or web address] and use [place]'s universal password to access. If you do not have the password, please contact the hospital operator. Locate the [facility] provider you are looking for under [facility] and page to a number that you can be directly reached. If you still have difficulty reaching the provider, please page the [facility] (Director on Call) for the Hospitalists listed on amion for assistance.  01/14/2021, 4:49 PM   This document was prepared using Dragon voice recognition software and may contain some unintended transcription errors.

## 2021-01-14 NOTE — Telephone Encounter (Signed)
On 11.17.2022 Discussed with [place] his mothers lab values.  Sodium low at 130 and WBC and ANC .4.  Reviewed significance of neutropenia and reviewed Neutropenic precautions with [place].  Because of the neutropenia and hyponatremia, Dr Marin Olp would like to keep a close eye on her for the next few weeks, including labwork and IVF.  Discussed dates for these treatments. [place] expressed frustration at trying to manage his mother at home as he feels her mental status has worsened over the past week.  Discussed situation with Dr Marin Olp and Charlsie Merles. Ongoing discussions today to determine best course of action

## 2021-01-14 NOTE — ED Triage Notes (Signed)
Pt refused to answer any questions,  said to talk to her md upstairs or to her son,  per son pt woke 0300 this am manic, was throwing things and yelling at son and caregiver,  son is concerned for her safety and care giver safety,  has mets to brain, liver and kidneys

## 2021-01-14 NOTE — ED Notes (Signed)
Four heat pad were given to the pt, I assisted her by placing them where she wanted them placed.

## 2021-01-14 NOTE — ED Notes (Addendum)
Pt requested three more heat pads and assist her in replacing the old ones. Heat pads were replaced and place in spots where pt requested.

## 2021-01-14 NOTE — ED Provider Notes (Signed)
[facility] EMERGENCY DEPARTMENT Provider Note   CSN: [id number] Arrival date & time: 01/14/21  1005     History No chief complaint on file.   Ariel [name] is a 65 y.o. female.  Patient seen by physician assistant.  See her note      Past Medical History:  Diagnosis Date   Chicken pox    Complication of anesthesia    Per patient, very slow to wake up after anesthesia   Goals of care, counseling/discussion 12/22/2020   High cholesterol    High grade B-cell lymphoma ([place]) 12/22/2020   Hyperglycemia 11/10/2020   Melanoma ([place]) 2021   right hip   PONV (postoperative nausea and vomiting)    Urinary incontinence     Patient Active Problem List   Diagnosis Date Noted   High grade B-cell lymphoma ([place]) 12/22/2020   Goals of care, counseling/discussion 57/32/2025   Acute metabolic encephalopathy 42/70/6237   Brain metastasis ([place]) 12/09/2020   Brain tumor ([place]) 12/08/2020   Hyponatremia 12/08/2020   Night sweat 11/30/2020   Chicken pox 62/83/1517   Complication of anesthesia 11/22/2020   PONV (postoperative nausea and vomiting) 11/22/2020   Skin rash 11/10/2020   Tachycardia 11/10/2020   Anemia 11/10/2020   Hyperglycemia 11/10/2020   TIA (transient ischemic attack) 10/07/2020   Iron deficiency anemia 10/07/2020   Aphasia 10/06/2020   Melanoma ([place]) 2021   Multinodular goiter 12/22/2013   Subclinical hyperthyroidism 12/19/2013   Urinary incontinence 11/19/2013   Hyperlipidemia 11/19/2012   Routine general medical examination at a health care facility 11/18/2012    Past Surgical History:  Procedure Laterality Date   AIR/FLUID EXCHANGE Right 12/07/2020   Procedure: AIR/FLUID EXCHANGE;  Surgeon: Jalene Mullet, MD;  Location: [facility];  Service: Ophthalmology;  Laterality: Right;   APPENDECTOMY  1962   BIOPSY  12/14/2020   Procedure: BIOPSY;  Surgeon: Rush Landmark Telford Nab., MD;  Location: [place];  Service: Gastroenterology;;    ESOPHAGOGASTRODUODENOSCOPY (EGD) WITH PROPOFOL N/A 12/14/2020   Procedure: ESOPHAGOGASTRODUODENOSCOPY (EGD) WITH PROPOFOL;  Surgeon: Irving Copas., MD;  Location: [place];  Service: Gastroenterology;  Laterality: N/A;   EXCISION MELANOMA WITH SENTINEL LYMPH NODE BIOPSY Right 10/09/2019   Procedure: WIDE LOCAL EXCISION WITH ADVANCEMENT FLAP CLOSURE RIGHT HIP MELANOMA WITH SENTINEL LYMPH NODE BIOPSY;  Surgeon: Stark Klein, MD;  Location: [place];  Service: General;  Laterality: Right;   FINE NEEDLE ASPIRATION  12/14/2020   Procedure: FINE NEEDLE ASPIRATION (FNA) LINEAR;  Surgeon: Irving Copas., MD;  Location: [place];  Service: Gastroenterology;;   HERNIA REPAIR  2009   IR IMAGING GUIDED PORT INSERTION  12/30/2020   MOUTH SURGERY  11/2015   gum surgery and bone implant   PARS PLANA VITRECTOMY Right 12/07/2020   Procedure: PARS PLANA VITRECTOMY WITH 25 GAUGE;  Surgeon: Jalene Mullet, MD;  Location: [place];  Service: Ophthalmology;  Laterality: Right;   PHOTOCOAGULATION WITH LASER Right 12/07/2020   Procedure: PHOTOCOAGULATION WITH ENDOLASER PANRITINAL COAGULATION;  Surgeon: Jalene Mullet, MD;  Location: [place];  Service: Ophthalmology;  Laterality: Right;   UPPER ESOPHAGEAL ENDOSCOPIC ULTRASOUND (EUS) N/A 12/14/2020   Procedure: UPPER ESOPHAGEAL ENDOSCOPIC ULTRASOUND (EUS);  Surgeon: Irving Copas., MD;  Location: [facility];  Service: Gastroenterology;  Laterality: N/A;   WISDOM TOOTH EXTRACTION       OB History     Gravida  2   Para  2   Term      Preterm      AB  Living  2      SAB      IAB      Ectopic      Multiple      Live Births              Family History  Problem Relation Age of Onset   Coronary artery disease Father        died at 32   COPD Father    Alcohol abuse Father    Diabetes Mellitus II Father    Coronary artery disease Sister    Cancer Sister        breast   Heart attack Brother     Hyperlipidemia Other        died 33- "natural causes"   Hyperlipidemia Other        4 siblings    Hypertension Other        3 siblings    Social History   Tobacco Use   Smoking status: Never   Smokeless tobacco: Never  Vaping Use   Vaping Use: Never used  Substance Use Topics   Alcohol use: Never   Drug use: Never    Home Medications Prior to Admission medications   Medication Sig Start Date End Date Taking? Authorizing Provider  allopurinol (ZYLOPRIM) 100 MG tablet Take 1 tablet (100 mg total) by mouth daily. Start taking for 1 month only on 01/01/2021 12/28/20   Volanda Napoleon, MD  ciprofloxacin (CIPRO) 500 MG tablet Take 1 tablet (500 mg total) by mouth daily with breakfast. Start taking day AFTER chemo finishes for 15 days!!  You will do this with each cycle of chemotherapy!! Per Dr Jonette Eva 01/09/21   Brunetta Genera, MD  dexamethasone (DECADRON) 4 MG tablet Take 1 tablet (4 mg total) by mouth 2 (two) times daily. 01/13/21   Volanda Napoleon, MD  famciclovir (FAMVIR) 250 MG tablet Take 1 tablet (250 mg total) by mouth daily. 12/28/20   Volanda Napoleon, MD  fluconazole (DIFLUCAN) 100 MG tablet Take 1 tablet (100 mg total) by mouth daily. 01/06/21   Volanda Napoleon, MD  levETIRAcetam (KEPPRA) 500 MG tablet Take 1 tablet (500 mg total) by mouth 2 (two) times daily. 12/16/20 03/16/21  Charlynne Cousins, MD  lidocaine-prilocaine (EMLA) cream Apply to affected area once 01/03/21   Volanda Napoleon, MD  ondansetron (ZOFRAN) 8 MG tablet Take 1 tablet (8 mg total) by mouth 2 (two) times daily as needed for refractory nausea / vomiting. Start 2 days after chemotherapy. 01/03/21   Volanda Napoleon, MD  prochlorperazine (COMPAZINE) 10 MG tablet Take 1 tablet (10 mg total) by mouth every 6 (six) hours as needed (Nausea or vomiting). 01/03/21   Volanda Napoleon, MD  temazepam (RESTORIL) 15 MG capsule Take 1 capsule (15 mg total) by mouth at bedtime as needed for sleep. 01/05/21   Volanda Napoleon, MD    Allergies    Codeine and Plavix [clopidogrel]  Review of Systems   Review of Systems  Physical Exam Updated Vital Signs BP 96/67 (BP Location: Left Arm)   Pulse 82   Temp (!) 97.5 F (36.4 C) (Oral)   Resp 18   SpO2 100%   Physical Exam  ED Results / Procedures / Treatments   Labs (all labs ordered are listed, but only abnormal results are displayed) Labs Reviewed - No data to display  EKG None  Radiology No results found.  Procedures Procedures   Medications Ordered  in ED Medications - No data to display  ED Course  I have reviewed the triage vital signs and the nursing notes.  Pertinent labs & imaging results that were available during my care of the patient were reviewed by me and considered in my medical decision making (see chart for details).    MDM Rules/Calculators/A&P                            Final Clinical Impression(s) / ED Diagnoses Final diagnoses:  None    Rx / DC Orders ED Discharge Orders     None        Fredia Sorrow, MD 01/16/21 [phone]

## 2021-01-14 NOTE — Progress Notes (Signed)
Spoke with patient's daughter, Ariel [name]. Patient continues to be confused with her care at home becoming more difficult. The family continues to provide 24h sitters. Last night Ariel [name] c/o back pain which she described as a "pinched nerve". This is a new pain. It was treated with some success with extra strength tylenol.   Actively listened to the frustration and exhaustion from trying to manage their mom at home the last 2-3 weeks. They have looked into respite care and/or memory care placement but have run into roadblocks with each. There is no improvement in their mom's condition and they are worried if this will improve, and if so, how long it will take.   Palliative Care consult placed to see if they can help manage symptoms at home or offer medical management of her confusion.   Instructed Ariel [name] to take the patient to the ED if they started to feel like they were in crisis. Recommended she let the ED staff know that Danel is neutropenic if they do chose this option to attempt limiting the patient wait in the lobby.   Ariel [name] asked if there was any way they could have a discussion with Dr Marin Olp. Message and call back number given to Dr Marin Olp. He will attempt to call them.   Oncology Nurse Navigator Documentation  Oncology Nurse Navigator Flowsheets 01/14/2021  Abnormal Finding Date -  Confirmed Diagnosis Date -  Phase of Treatment -  Chemotherapy Actual Start Date: -  Navigator Follow Up Date: 01/24/2021  Navigator Follow Up Reason: Follow-up Appointment;Chemotherapy  Production assistant, radio Encounter Type Telephone  Telephone Symptom Mgt;Incoming Call  Treatment Initiated Date -  Patient Visit Type MedOnc  Treatment Phase Active Tx  Barriers/Navigation Needs Coordination of Care;Education  Education Pain/ Symptom Management  Interventions Education;Psycho-Social Support;Referrals  Acuity Level 2-Minimal Needs (1-2 Barriers Identified)  Referrals Palliative  Care  Coordination of Care -  Education Method Verbal  Support Groups/Services Friends and Family  Time Spent with Patient 42

## 2021-01-14 NOTE — ED Notes (Addendum)
First contact with patient. Patient arrived via triage from home by family with complaints of irritability and per family in a manic state since 0300. Patient is verbally hostile when answering questions but is non-violent with staff at this time. Patient is uncooperative and will not change into a gown/get in bed or stay on monitor. Patient is A&OX 4 during questioning. Respirations even/unlabored and speaking in full sentences - Patient given cup of water as requested to continue assessment. Patient updated on plan of care. Will continue to monitor patient.   1054: PA in to assess patient - Patient was A&Ox3 for PA. Son now at bedside.  1134: Patient is more agreeable to treatment at this time and states she is more comfortable. Heat pads given to patient to place on hips for discomfort. Patient up to restroom at this time to obtain a urine sample. No acute distress noted.   1211: Patient does not want to lay in gurney - Patient states it is too uncomfortable. Patient given sorbet per request. Patient states she is uncomfortable again but does not want anything more for pain. No acute distress noted. Call light in reach. Patient is calm/pleasant at this time.   1254: Patient states she now wants something for pain. PA made aware.   1330: Patient states her pain is now 8/10 - Patient medicated per orders. Given heat pads for back pain.   1427: Patient states her pain is "so much better" at a 4/10. currently plans to admit. Patient understands plan of care. Patient remains in wc as requested - heat packs provided for comfort. Will continue to monitor.   1506: Patient states she is now wanting something more for pain and some chocolate ice cream. PA made aware. Katie RN given report and updated on new orders. No acute distress noted upon this RN's departure of Patient.  1512: Ready bed noted at this time. - attempted to call report at this time - RN unavailable. Will call back  1518: Report given to  [facility] at this time.    1532: Report given to [place] at [facility] - Patient to go to room 1519.

## 2021-01-15 ENCOUNTER — Inpatient Hospital Stay (HOSPITAL_COMMUNITY): Payer: Medicare Other

## 2021-01-15 DIAGNOSIS — R4182 Altered mental status, unspecified: Secondary | ICD-10-CM | POA: Diagnosis not present

## 2021-01-15 DIAGNOSIS — G9341 Metabolic encephalopathy: Secondary | ICD-10-CM | POA: Diagnosis not present

## 2021-01-15 DIAGNOSIS — C7931 Secondary malignant neoplasm of brain: Secondary | ICD-10-CM | POA: Diagnosis not present

## 2021-01-15 DIAGNOSIS — Z79899 Other long term (current) drug therapy: Secondary | ICD-10-CM

## 2021-01-15 DIAGNOSIS — C8333 Diffuse large B-cell lymphoma, intra-abdominal lymph nodes: Secondary | ICD-10-CM

## 2021-01-15 DIAGNOSIS — D696 Thrombocytopenia, unspecified: Secondary | ICD-10-CM

## 2021-01-15 LAB — CBC WITH DIFFERENTIAL/PLATELET
Abs Immature Granulocytes: 0.29 10*3/uL — ABNORMAL HIGH (ref 0.00–0.07)
Basophils Absolute: 0 10*3/uL (ref 0.0–0.1)
Basophils Relative: 2 %
Eosinophils Absolute: 0 10*3/uL (ref 0.0–0.5)
Eosinophils Relative: 0 %
HCT: 32.4 % — ABNORMAL LOW (ref 36.0–46.0)
Hemoglobin: 10.5 g/dL — ABNORMAL LOW (ref 12.0–15.0)
Immature Granulocytes: 18 %
Lymphocytes Relative: 40 %
Lymphs Abs: 0.7 10*3/uL (ref 0.7–4.0)
MCH: 24.5 pg — ABNORMAL LOW (ref 26.0–34.0)
MCHC: 32.4 g/dL (ref 30.0–36.0)
MCV: 75.7 fL — ABNORMAL LOW (ref 80.0–100.0)
Monocytes Absolute: 0.2 10*3/uL (ref 0.1–1.0)
Monocytes Relative: 13 %
Neutro Abs: 0.5 10*3/uL — ABNORMAL LOW (ref 1.7–7.7)
Neutrophils Relative %: 27 %
Platelets: 120 10*3/uL — ABNORMAL LOW (ref 150–400)
RBC: 4.28 MIL/uL (ref 3.87–5.11)
RDW: 18 % — ABNORMAL HIGH (ref 11.5–15.5)
WBC: 1.7 10*3/uL — ABNORMAL LOW (ref 4.0–10.5)
nRBC: 1.8 % — ABNORMAL HIGH (ref 0.0–0.2)

## 2021-01-15 LAB — COMPREHENSIVE METABOLIC PANEL
ALT: 59 U/L — ABNORMAL HIGH (ref 0–44)
AST: 24 U/L (ref 15–41)
Albumin: 2.7 g/dL — ABNORMAL LOW (ref 3.5–5.0)
Alkaline Phosphatase: 77 U/L (ref 38–126)
Anion gap: 6 (ref 5–15)
BUN: 8 mg/dL (ref 8–23)
CO2: 28 mmol/L (ref 22–32)
Calcium: 8.4 mg/dL — ABNORMAL LOW (ref 8.9–10.3)
Chloride: 96 mmol/L — ABNORMAL LOW (ref 98–111)
Creatinine, Ser: 0.34 mg/dL — ABNORMAL LOW (ref 0.44–1.00)
GFR, Estimated: >60 mL/min (ref >60)
Glucose, Bld: 93 mg/dL (ref 70–99)
Potassium: 3.4 mmol/L — ABNORMAL LOW (ref 3.5–5.1)
Sodium: 130 mmol/L — ABNORMAL LOW (ref 135–145)
Total Bilirubin: 0.7 mg/dL (ref 0.3–1.2)
Total Protein: 5.7 g/dL — ABNORMAL LOW (ref 6.5–8.1)

## 2021-01-15 LAB — GLUCOSE, CAPILLARY: Glucose-Capillary: 78 mg/dL (ref 70–99)

## 2021-01-15 LAB — AMMONIA: Ammonia: 32 umol/L (ref 9–35)

## 2021-01-15 MED ORDER — POTASSIUM CHLORIDE CRYS ER 20 MEQ PO TBCR
40.0000 meq | EXTENDED_RELEASE_TABLET | Freq: Once | ORAL | Status: AC
  Administered 2021-01-15: 40 meq via ORAL
  Filled 2021-01-15: qty 2

## 2021-01-15 MED ORDER — GADOBUTROL 1 MMOL/ML IV SOLN
6.5000 mL | Freq: Once | INTRAVENOUS | Status: AC | PRN
  Administered 2021-01-15: 6.5 mL via INTRAVENOUS

## 2021-01-15 MED ORDER — KETOROLAC TROMETHAMINE 30 MG/ML IJ SOLN
15.0000 mg | Freq: Once | INTRAMUSCULAR | Status: AC
  Administered 2021-01-15: 15 mg via INTRAVENOUS
  Filled 2021-01-15: qty 1

## 2021-01-15 MED ORDER — SODIUM CHLORIDE 0.9 % IV SOLN
510.0000 mg | Freq: Once | INTRAVENOUS | Status: AC
  Administered 2021-01-15: 510 mg via INTRAVENOUS
  Filled 2021-01-15: qty 17

## 2021-01-15 MED ORDER — LIP MEDEX EX OINT
TOPICAL_OINTMENT | CUTANEOUS | Status: DC | PRN
  Filled 2021-01-15 (×2): qty 7

## 2021-01-15 MED ORDER — CHLORHEXIDINE GLUCONATE CLOTH 2 % EX PADS
6.0000 | MEDICATED_PAD | Freq: Every day | CUTANEOUS | Status: DC
  Administered 2021-01-16 – 2021-02-08 (×24): 6 via TOPICAL

## 2021-01-15 MED ORDER — SODIUM CHLORIDE 0.9% FLUSH
10.0000 mL | INTRAVENOUS | Status: DC | PRN
  Administered 2021-01-21: 10 mL

## 2021-01-15 NOTE — Progress Notes (Signed)
PROGRESS NOTE    Ariel [name]  EFE:[id number] DOB: 23-Aug-1955 DOA: 01/14/2021 PCP: [name], NP    Brief Narrative:  65 year old female with history of metastatic B-cell lymphoma with brain mets who presented from home due to progressively worsening altered mental status.  Patient was started on Decadron 12 weeks ago and has been having decline of her mentation.  She is currently on Decadron 5 mg twice daily.  For the past 24 hours she has had severe behavioral changes and aggressive behavior at home.  She was brought into the ED for further evaluation.  Upon presentation to the ED she was agitated and received a dose of Zyprexa with improvement.  EDP contacted Dr. Marin Olp, her medical oncologist, who recommended admission and observe for 48 hours.  When she was evaluated, her mental status was better, but she seemed to be unable to focus.  She denied headache, chest pain or abdominal pain but was complaining of lower back pain.  Denied rhinorrhea, sore throat, productive cough, wheezing or hemoptysis.  No lower extremity edema.  No dysuria, frequency or hematuria.  No polyuria, polydipsia, polyphagia or blurred vision.   Assessment & Plan:   Principal Problem:   AMS (altered mental status) Active Problems:   Hyperlipidemia   Iron deficiency anemia   Hyponatremia   Brain metastasis (HCC)   Acute metabolic encephalopathy   High grade B-cell lymphoma (HCC)   Neutropenia (HCC)  Principal Problem:   Acute toxic metabolic encephalopathy Suspect related to steroid use prior to admit Plan to taper glucocorticoids if possible, Oncology agrees Continue Zyprexa 5 mg at bedtime. Seems oriented x 3 today Cont to avoid sedating meds if possible   Active Problems:   Hyponatremia Was continued on normal saline infusion. urine osmolality pending urine sodium pending Sodium improved to 130 today     Brain metastasis (HCC) On decadron 4mg  bid prior to visit, weaned to 4mg   daily Oncology following CT head with findings of decreased density of lesions in L temporal and parietal lobes which may reflect evolving post-treatment change F/u MRI brain ordered and performed, pending results     High grade B-cell lymphoma (HCC)   Neutropenia ([place]) Neutropenic precautions. Currently on acyclovir and PO cipro post-chemo WBC trending up Appreciate input by Oncology     Iron deficiency anemia Monitor hematocrit and hemoglobin. Iron supplementation. CBC trends reviewed, stable     Hyperlipidemia Currently not on medical therapy.    DVT prophylaxis: SCD's Code Status: Full Family Communication: Pt in room, family not at bedside  Status is: Inpatient  Remains inpatient appropriate because: Severity of illness   Consultants:  Oncology  Procedures:    Antimicrobials: Anti-infectives (From admission, onward)    Start     Dose/Rate Route Frequency Ordered Stop   01/15/21 1000  acyclovir (ZOVIRAX) 200 MG capsule 200 mg        200 mg Oral Daily 01/14/21 1747     01/15/21 0800  ciprofloxacin (CIPRO) tablet 500 mg       Note to Pharmacy: Start taking day AFTER chemo finishes for 15 days!!  You will do this with each cycle of chemotherapy!! Per Dr Jonette Eva     500 mg Oral Daily with breakfast 01/14/21 1741     01/14/21 2000  fluconazole (DIFLUCAN) tablet 100 mg        100 mg Oral Daily 01/14/21 1741     01/14/21 1830  famciclovir (FAMVIR) tablet 125 mg  Status:  Discontinued  125 mg Oral Daily 01/14/21 1741 01/14/21 1746       Subjective: Without complaints this AM. In good spirits.   Objective: Vitals:   01/14/21 1625 01/14/21 2319 01/15/21 0502 01/15/21 1334  BP: 136/68 (!) 156/68 (!) 151/62 (!) 159/67  Pulse: 78 93 85 95  Resp: 20 20 20 16   Temp: 98 F (36.7 C) 98.4 F (36.9 C) 98 F (36.7 C) 98.1 F (36.7 C)  TempSrc: Oral Oral Oral Oral  SpO2: 99% 97% 96% 93%    Intake/Output Summary (Last 24 hours) at 01/15/2021 1517 Last data  filed at 01/15/2021 1300 Gross per 24 hour  Intake 1260.12 ml  Output 500 ml  Net 760.12 ml   There were no vitals filed for this visit.  Examination: General exam: Awake, laying in bed, in nad Respiratory system: Normal respiratory effort, no wheezing Cardiovascular system: regular rate, s1, s2 Gastrointestinal system: Soft, nondistended, positive BS Central nervous system: CN2-12 grossly intact, strength intact Extremities: Perfused, no clubbing Skin: Normal skin turgor, no notable skin lesions seen Psychiatry: Mood normal // no visual hallucinations   Data Reviewed: I have personally reviewed following labs and imaging studies  CBC: Recent Labs  Lab 01/13/21 1029 01/14/21 1148 01/15/21 0521  WBC 0.8* 1.3* 1.7*  NEUTROABS 0.4* 0.3* 0.5*  HGB 10.8* 11.1* 10.5*  HCT 32.9* 33.9* 32.4*  MCV 75.8* 75.3* 75.7*  PLT 163 142* 482*   Basic Metabolic Panel: Recent Labs  Lab 01/13/21 1029 01/14/21 1148 01/15/21 0521  NA 130* 127* 130*  K 4.0 3.5 3.4*  CL 93* 92* 96*  CO2 32 27 28  GLUCOSE 157* 122* 93  BUN 14 15 8   CREATININE 0.48 0.44 0.34*  CALCIUM 9.6 9.1 8.4*   GFR: CrCl cannot be calculated (Unknown ideal weight.). Liver Function Tests: Recent Labs  Lab 01/13/21 1029 01/14/21 1148 01/15/21 0521  AST 18 26 24   ALT 56* 55* 59*  ALKPHOS 72 72 77  BILITOT 0.4 1.0 0.7  PROT 6.6 6.3* 5.7*  ALBUMIN 3.5 3.1* 2.7*   No results for input(s): LIPASE, AMYLASE in the last 168 hours. Recent Labs  Lab 01/14/21 1203 01/15/21 0521  AMMONIA 37* 32   Coagulation Profile: No results for input(s): INR, PROTIME in the last 168 hours. Cardiac Enzymes: No results for input(s): CKTOTAL, CKMB, CKMBINDEX, TROPONINI in the last 168 hours. BNP (last 3 results) No results for input(s): PROBNP in the last 8760 hours. HbA1C: No results for input(s): HGBA1C in the last 72 hours. CBG: Recent Labs  Lab 01/15/21 0719  GLUCAP 78   Lipid Profile: No results for input(s):  CHOL, HDL, LDLCALC, TRIG, CHOLHDL, LDLDIRECT in the last 72 hours. Thyroid Function Tests: No results for input(s): TSH, T4TOTAL, FREET4, T3FREE, THYROIDAB in the last 72 hours. Anemia Panel: No results for input(s): VITAMINB12, FOLATE, FERRITIN, TIBC, IRON, RETICCTPCT in the last 72 hours. Sepsis Labs: Recent Labs  Lab 01/14/21 1148  LATICACIDVEN 2.3*    Recent Results (from the past 240 hour(s))  Resp Panel by RT-PCR (Flu A&B, Covid) Nasopharyngeal Swab     Status: None   Collection Time: 01/14/21  2:09 PM   Specimen: Nasopharyngeal Swab; Nasopharyngeal(NP) swabs in vial transport medium  Result Value Ref Range Status   SARS Coronavirus 2 by RT PCR NEGATIVE NEGATIVE Final    Comment: (NOTE) SARS-CoV-2 target nucleic acids are NOT DETECTED.  The SARS-CoV-2 RNA is generally detectable in upper respiratory specimens during the acute phase of infection. The lowest concentration  of SARS-CoV-2 viral copies this assay can detect is 138 copies/mL. A negative result does not preclude SARS-Cov-2 infection and should not be used as the sole basis for treatment or other patient management decisions. A negative result may occur with  improper specimen collection/handling, submission of specimen other than nasopharyngeal swab, presence of viral mutation(s) within the areas targeted by this assay, and inadequate number of viral copies(<138 copies/mL). A negative result must be combined with clinical observations, patient history, and epidemiological information. The expected result is Negative.  Fact Sheet for Patients:  EntrepreneurPulse.com.au  Fact Sheet for Healthcare Providers:  IncredibleEmployment.be  This test is no t yet approved or cleared by the [place] FDA and  has been authorized for detection and/or diagnosis of SARS-CoV-2 by FDA under an Emergency Use Authorization (EUA). This EUA will remain  in effect (meaning this test can be  used) for the duration of the COVID-19 declaration under Section 564(b)(1) of the Act, 21 U.S.C.section 360bbb-3(b)(1), unless the authorization is terminated  or revoked sooner.       Influenza A by PCR NEGATIVE NEGATIVE Final   Influenza B by PCR NEGATIVE NEGATIVE Final    Comment: (NOTE) The Xpert Xpress SARS-CoV-2/FLU/RSV plus assay is intended as an aid in the diagnosis of influenza from Nasopharyngeal swab specimens and should not be used as a sole basis for treatment. Nasal washings and aspirates are unacceptable for Xpert Xpress SARS-CoV-2/FLU/RSV testing.  Fact Sheet for Patients: EntrepreneurPulse.com.au  Fact Sheet for Healthcare Providers: IncredibleEmployment.be  This test is not yet approved or cleared by the [place] FDA and has been authorized for detection and/or diagnosis of SARS-CoV-2 by FDA under an Emergency Use Authorization (EUA). This EUA will remain in effect (meaning this test can be used) for the duration of the COVID-19 declaration under Section 564(b)(1) of the Act, 21 U.S.C. section 360bbb-3(b)(1), unless the authorization is terminated or revoked.  Performed at [facility], [address] [place], [place] [address]      Radiology Studies: DG Chest 2 View  Result Date: 01/14/2021 CLINICAL DATA:  Altered mental status. EXAM: CHEST - 2 VIEW COMPARISON:  PET-CT 12/27/2020 FINDINGS: Right chest wall port a catheter is noted with tip in the projection of the cavoatrial junction. The heart size and mediastinal contours are within normal limits. Both lungs are clear. The visualized skeletal structures are unremarkable. IMPRESSION: No active cardiopulmonary disease. Electronically Signed   By: Kerby Moors M.D.   On: 01/14/2021 14:36   CT Head Wo Contrast  Result Date: 01/14/2021 CLINICAL DATA:  Altered mental status, history of metastatic disease in the brain EXAM: CT HEAD WITHOUT CONTRAST  TECHNIQUE: Contiguous axial images were obtained from the base of the skull through the vertex without intravenous contrast. COMPARISON:  Brain MRI and head CT 01/02/2021 FINDINGS: Brain: There is hypodensity in the left posterior temporal lobe and parietal lobe consistent with known metastatic lesions with surrounding vasogenic edema better evaluated on recent brain MRI. Compared to the most recent head CT dated 01/02/2021, the lesions are decreased in density which may reflect evolving post treatment change. There is no evidence of acute territorial infarct, acute intracranial hemorrhage, or extra-axial fluid collection. The ventricles are stable in size. There is no midline shift. Vascular: No hyperdense vessel or unexpected calcification. Skull: Normal. Negative for fracture or focal lesion. Sinuses/Orbits: The imaged paranasal sinuses are clear. The mastoid air cells are clear. The globes and orbits are unremarkable. Other: None. IMPRESSION: 1. No  evidence of acute intracranial hemorrhage, territorial infarct, or extra-axial fluid collection. 2. Decreased density of the lesions in the left temporal and parietal lobes which may reflect evolving post treatment change. This could be further evaluated with MRI as indicated. Electronically Signed   By: Valetta Mole M.D.   On: 01/14/2021 13:04    Scheduled Meds:  acyclovir  200 mg Oral Daily   Chlorhexidine Gluconate Cloth  6 each Topical Daily   ciprofloxacin  500 mg Oral Q breakfast   dexamethasone  4 mg Oral Daily   fluconazole  100 mg Oral Daily   levETIRAcetam  500 mg Oral BID   OLANZapine zydis  5 mg Oral QHS   Continuous Infusions:  0.9 % NaCl with KCl 20 mEq / L 100 mL/hr at 01/15/21 0434     LOS: 1 day   Marylu Lund, MD [facility] Pager On Amion  If 7PM-7AM, please contact night-coverage 01/15/2021, 3:17 PM

## 2021-01-15 NOTE — Consult Note (Signed)
Referral MD  Reason for Referral: Altered mental status; large cell lymphoma  Chief Complaint  Patient presents with   Altered Mental Status  : I am not sure why I am here.  HPI: Ariel [name] is well-known to me.  She is a very charming 65 year old white female.  She has a history of high-grade B cell non-Hodgkin's lymphoma.  She actually presented with CNS disease.  She did not have a biopsy of one of the CNS lesions because of the location.  She was diagnosed with lymphoma on a biopsy from a abdominal lymph node.  She has had 1 cycle of chemotherapy with R-ICE.  This was completed on 01/07/2021.  She is on antibiotics with acyclovir/Diflucan/ciprofloxacin.  Her white cell count is trending upward right now.  She does have some moments of clarity.  She was complaining of severe back pain.  Not sure why she would have had this unless this was reflective of the Neulasta that she received.  She is hungry.  She has had no nausea or vomiting.  She says she has some mouth sores.  We looked inside her mouth, there was an ulceration over on the left buccal mucosa.  There is no bleeding.  There is no obvious cough or shortness of breath.  She had a CT scan that was done.  This was of the brain.  Looks like the CNS lesions were decreasing.  We really did have an MRI to see exactly what is going on.  She was on steroids.  Some of the confusion could be from steroids..  She received ifosfamide.  Sometimes this could also cause confusion.  As far as I can tell, there is been no fever.  Currently, I would say performance status is probably ECOG 1-2.    Past Medical History:  Diagnosis Date   Chicken pox    Complication of anesthesia    Per patient, very slow to wake up after anesthesia   Goals of care, counseling/discussion 12/22/2020   High cholesterol    High grade B-cell lymphoma ([place]) 12/22/2020   Hyperglycemia 11/10/2020   Melanoma ([place]) 2021   right hip   PONV (postoperative  nausea and vomiting)    Urinary incontinence   :   Past Surgical History:  Procedure Laterality Date   AIR/FLUID EXCHANGE Right 12/07/2020   Procedure: AIR/FLUID EXCHANGE;  Surgeon: Jalene Mullet, MD;  Location: [facility];  Service: Ophthalmology;  Laterality: Right;   APPENDECTOMY  1962   BIOPSY  12/14/2020   Procedure: BIOPSY;  Surgeon: Rush Landmark Telford Nab., MD;  Location: [facility];  Service: Gastroenterology;;   ESOPHAGOGASTRODUODENOSCOPY (EGD) WITH PROPOFOL N/A 12/14/2020   Procedure: ESOPHAGOGASTRODUODENOSCOPY (EGD) WITH PROPOFOL;  Surgeon: Irving Copas., MD;  Location: [facility];  Service: Gastroenterology;  Laterality: N/A;   EXCISION MELANOMA WITH SENTINEL LYMPH NODE BIOPSY Right 10/09/2019   Procedure: WIDE LOCAL EXCISION WITH ADVANCEMENT FLAP CLOSURE RIGHT HIP MELANOMA WITH SENTINEL LYMPH NODE BIOPSY;  Surgeon: Stark Klein, MD;  Location: [facility];  Service: General;  Laterality: Right;   FINE NEEDLE ASPIRATION  12/14/2020   Procedure: FINE NEEDLE ASPIRATION (FNA) LINEAR;  Surgeon: Irving Copas., MD;  Location: [facility];  Service: Gastroenterology;;   HERNIA REPAIR  2009   IR IMAGING GUIDED PORT INSERTION  12/30/2020   MOUTH SURGERY  11/2015   gum surgery and bone implant   PARS PLANA VITRECTOMY Right 12/07/2020   Procedure: PARS PLANA VITRECTOMY WITH 25 GAUGE;  Surgeon: Jalene Mullet, MD;  Location:  [facility];  Service: Ophthalmology;  Laterality: Right;   PHOTOCOAGULATION WITH LASER Right 12/07/2020   Procedure: PHOTOCOAGULATION WITH ENDOLASER PANRITINAL COAGULATION;  Surgeon: Jalene Mullet, MD;  Location: [facility];  Service: Ophthalmology;  Laterality: Right;   UPPER ESOPHAGEAL ENDOSCOPIC ULTRASOUND (EUS) N/A 12/14/2020   Procedure: UPPER ESOPHAGEAL ENDOSCOPIC ULTRASOUND (EUS);  Surgeon: Irving Copas., MD;  Location: [facility];  Service: Gastroenterology;  Laterality: N/A;   WISDOM TOOTH EXTRACTION    :   Current  Facility-Administered Medications:    0.9 % NaCl with KCl 20 mEq/ L  infusion, , Intravenous, Continuous, Reubin Milan, MD, Last Rate: 100 mL/hr at 01/15/21 0434, New Bag at 01/15/21 0434   acetaminophen (TYLENOL) tablet 650 mg, 650 mg, Oral, Q6H PRN, 650 mg at 01/14/21 2220 **OR** acetaminophen (TYLENOL) suppository 650 mg, 650 mg, Rectal, Q6H PRN, Reubin Milan, MD   acyclovir (ZOVIRAX) 200 MG capsule 200 mg, 200 mg, Oral, Daily, Reubin Milan, MD   Chlorhexidine Gluconate Cloth 2 % PADS 6 each, 6 each, Topical, Daily, Reubin Milan, MD   ciprofloxacin (CIPRO) tablet 500 mg, 500 mg, Oral, Q breakfast, Reubin Milan, MD   dexamethasone (DECADRON) tablet 4 mg, 4 mg, Oral, Daily, Reubin Milan, MD   ferumoxytol [facility]) 510 mg in sodium chloride 0.9 % 100 mL IVPB, 510 mg, Intravenous, Once, Nasiyah Laverdiere, Rudell Cobb, MD   fluconazole (DIFLUCAN) tablet 100 mg, 100 mg, Oral, Daily, Reubin Milan, MD, 100 mg at 01/14/21 2220   levETIRAcetam (KEPPRA) tablet 500 mg, 500 mg, Oral, BID, Reubin Milan, MD, 500 mg at 01/14/21 2220   OLANZapine zydis (ZYPREXA) disintegrating tablet 5 mg, 5 mg, Oral, QHS, Harris, Abigail, PA-C, 5 mg at 01/14/21 2220   ondansetron (ZOFRAN) tablet 4 mg, 4 mg, Oral, Q6H PRN **OR** ondansetron (ZOFRAN) injection 4 mg, 4 mg, Intravenous, Q6H PRN, Reubin Milan, MD   temazepam (RESTORIL) capsule 15 mg, 15 mg, Oral, QHS PRN, Reubin Milan, MD, 15 mg at 01/14/21 2220  Facility-Administered Medications Ordered in Other Encounters:    0.9 %  sodium chloride infusion, , Intravenous, Once, Earlie Schank, Rudell Cobb, MD:   acyclovir  200 mg Oral Daily   Chlorhexidine Gluconate Cloth  6 each Topical Daily   ciprofloxacin  500 mg Oral Q breakfast   dexamethasone  4 mg Oral Daily   fluconazole  100 mg Oral Daily   levETIRAcetam  500 mg Oral BID   OLANZapine zydis  5 mg Oral QHS  :   Allergies  Allergen Reactions   Codeine Nausea Only    Plavix [Clopidogrel]     Hives,lip swelling   :   Family History  Problem Relation Age of Onset   Coronary artery disease Father        died at 53   COPD Father    Alcohol abuse Father    Diabetes Mellitus II Father    Coronary artery disease Sister    Cancer Sister        breast   Heart attack Brother    Hyperlipidemia Other        died 7- "natural causes"   Hyperlipidemia Other        4 siblings    Hypertension Other        3 siblings  :   Social History   Socioeconomic History   Marital status: Widowed    Spouse name: Not on file   Number of children: Not on file  Years of education: Not on file   Highest education level: Not on file  Occupational History   Occupation: retired  Tobacco Use   Smoking status: Never   Smokeless tobacco: Never  Vaping Use   Vaping Use: Never used  Substance and Sexual Activity   Alcohol use: Never   Drug use: Never   Sexual activity: Not Currently  Other Topics Concern   Not on file  Social History Narrative   Lives with husband   One Son- lives locally, 1 grand-daughter   Daughter- Sport and exercise psychologist. [place]   Completed Hotel manager school   Works as a Probation officer.         Social Determinants of Radio broadcast assistant Strain: Low Risk    Difficulty of Paying Living Expenses: Not hard at all  Food Insecurity: No Food Insecurity   Worried About Charity fundraiser in the Last Year: Never true   Arboriculturist in the Last Year: Never true  Transportation Needs: No Transportation Needs   Lack of Transportation (Medical): No   Lack of Transportation (Non-Medical): No  Physical Activity: Sufficiently Active   Days of Exercise per Week: 4 days   Minutes of Exercise per Session: 40 min  Stress: No Stress Concern Present   Feeling of Stress : Not at all  Social Connections: Moderately Isolated   Frequency of Communication with Friends and Family: More than three times a week   Frequency of Social Gatherings with Friends  and Family: More than three times a week   Attends Religious Services: More than 4 times per year   Active Member of [employer] or Organizations: No   Attends Archivist Meetings: Never   Marital Status: Widowed  Human resources officer Violence: Not At Risk   Fear of Current or Ex-Partner: No   Emotionally Abused: No   Physically Abused: No   Sexually Abused: No  :  Review of Systems  Constitutional:  Positive for malaise/fatigue.  HENT: Negative.    Eyes: Negative.   Respiratory: Negative.    Cardiovascular: Negative.   Gastrointestinal: Negative.   Genitourinary: Negative.   Musculoskeletal:  Positive for back pain.  Skin: Negative.   Neurological: Negative.   Endo/Heme/Allergies: Negative.   Psychiatric/Behavioral: Negative.      Exam:  Physical Exam Vitals reviewed.  HENT:     Head: Normocephalic and atraumatic.  Eyes:     Pupils: Pupils are equal, round, and reactive to light.  Cardiovascular:     Rate and Rhythm: Normal rate and regular rhythm.     Heart sounds: Normal heart sounds.  Pulmonary:     Effort: Pulmonary effort is normal.     Breath sounds: Normal breath sounds.  Abdominal:     General: Bowel sounds are normal.     Palpations: Abdomen is soft.  Musculoskeletal:        General: No tenderness or deformity. Normal range of motion.     Cervical back: Normal range of motion.  Lymphadenopathy:     Cervical: No cervical adenopathy.  Skin:    General: Skin is warm and dry.     Findings: No erythema or rash.  Neurological:     Mental Status: She is alert and oriented to person, place, and time.  Psychiatric:     Comments: She does have moments of clarity.  She does know that it is Saturday morning.  She does have some tangential thinking.  There is some rapid speech.  There  does not appear to be any kind of agitation.     Patient Vitals for the past 24 hrs:  BP Temp Temp src Pulse Resp SpO2  01/15/21 0502 (!) 151/62 98 F (36.7 C) Oral 85 20 96 %   01/14/21 2319 (!) 156/68 98.4 F (36.9 C) Oral 93 20 97 %  01/14/21 1625 136/68 98 F (36.7 C) Oral 78 20 99 %  01/14/21 1530 115/67 -- -- -- 14 100 %  01/14/21 1500 125/61 -- -- 73 14 100 %  01/14/21 1300 112/60 -- -- 85 16 99 %  01/14/21 1208 110/62 -- -- 74 16 100 %  01/14/21 1200 -- 98.1 F (36.7 C) Oral -- -- --  01/14/21 1014 96/67 (!) 97.5 F (36.4 C) Oral 82 18 100 %      Recent Labs    01/14/21 1148 01/15/21 0521  WBC 1.3* 1.7*  HGB 11.1* 10.5*  HCT 33.9* 32.4*  PLT 142* 120*    Recent Labs    01/14/21 1148 01/15/21 0521  NA 127* 130*  K 3.5 3.4*  CL 92* 96*  CO2 27 28  GLUCOSE 122* 93  BUN 15 8  CREATININE 0.44 0.34*  CALCIUM 9.1 8.4*    Blood smear review: None  Pathology: None    Assessment and Plan: Ms. Prisk is a very nice 65 year old white female.  She has high-grade large cell lymphoma.  She had 1 cycle of chemotherapy..  She has had confusion.  This might be multifactorial.  We are trying to get her down on the steroids.  She is on Decadron at 4 mg daily now.  Again, it be interesting to see what the MRI shows.  Hopefully she will be able to have an MRI done.  Her white cell count is coming back up.  Sometimes, neutropenia can cause some change in mental state.  The back pain could have been from the Neulasta that she received.  Hopefully, she will come out of this.  We did have a nice prayer session.  She is hungry..  She has a Port-A-Cath in.  We really need to try to access this.  I appreciate the outstanding care as she will get from all the staff up on 5 E.  [name], MD  Oswaldo Milian 41:10

## 2021-01-16 DIAGNOSIS — G9341 Metabolic encephalopathy: Secondary | ICD-10-CM | POA: Diagnosis not present

## 2021-01-16 DIAGNOSIS — C7931 Secondary malignant neoplasm of brain: Secondary | ICD-10-CM | POA: Diagnosis not present

## 2021-01-16 LAB — CBC WITH DIFFERENTIAL/PLATELET
Abs Immature Granulocytes: 0.85 10*3/uL — ABNORMAL HIGH (ref 0.00–0.07)
Basophils Absolute: 0 10*3/uL (ref 0.0–0.1)
Basophils Relative: 0 %
Eosinophils Absolute: 0 10*3/uL (ref 0.0–0.5)
Eosinophils Relative: 0 %
HCT: 31.4 % — ABNORMAL LOW (ref 36.0–46.0)
Hemoglobin: 9.9 g/dL — ABNORMAL LOW (ref 12.0–15.0)
Immature Granulocytes: 12 %
Lymphocytes Relative: 12 %
Lymphs Abs: 0.9 10*3/uL (ref 0.7–4.0)
MCH: 24.4 pg — ABNORMAL LOW (ref 26.0–34.0)
MCHC: 31.5 g/dL (ref 30.0–36.0)
MCV: 77.5 fL — ABNORMAL LOW (ref 80.0–100.0)
Monocytes Absolute: 0.7 10*3/uL (ref 0.1–1.0)
Monocytes Relative: 11 %
Neutro Abs: 4.5 10*3/uL (ref 1.7–7.7)
Neutrophils Relative %: 65 %
Platelets: 112 10*3/uL — ABNORMAL LOW (ref 150–400)
RBC: 4.05 MIL/uL (ref 3.87–5.11)
RDW: 18.8 % — ABNORMAL HIGH (ref 11.5–15.5)
WBC: 7 10*3/uL (ref 4.0–10.5)
nRBC: 0.7 % — ABNORMAL HIGH (ref 0.0–0.2)

## 2021-01-16 LAB — COMPREHENSIVE METABOLIC PANEL
ALT: 67 U/L — ABNORMAL HIGH (ref 0–44)
AST: 26 U/L (ref 15–41)
Albumin: 2.5 g/dL — ABNORMAL LOW (ref 3.5–5.0)
Alkaline Phosphatase: 121 U/L (ref 38–126)
Anion gap: 5 (ref 5–15)
BUN: 9 mg/dL (ref 8–23)
CO2: 26 mmol/L (ref 22–32)
Calcium: 8.6 mg/dL — ABNORMAL LOW (ref 8.9–10.3)
Chloride: 102 mmol/L (ref 98–111)
Creatinine, Ser: 0.34 mg/dL — ABNORMAL LOW (ref 0.44–1.00)
GFR, Estimated: >60 mL/min (ref >60)
Glucose, Bld: 88 mg/dL (ref 70–99)
Potassium: 3.8 mmol/L (ref 3.5–5.1)
Sodium: 133 mmol/L — ABNORMAL LOW (ref 135–145)
Total Bilirubin: 0.8 mg/dL (ref 0.3–1.2)
Total Protein: 5.5 g/dL — ABNORMAL LOW (ref 6.5–8.1)

## 2021-01-16 NOTE — Progress Notes (Signed)
PROGRESS NOTE    Ariel [name]  ZRA:[id number] DOB: December 22, 1955 DOA: 01/14/2021 PCP: [name], NP    Brief Narrative:  65 year old female with history of metastatic B-cell lymphoma with brain mets who presented from home due to progressively worsening altered mental status.  Patient was started on Decadron 12 weeks ago and has been having decline of her mentation.  She is currently on Decadron 5 mg twice daily.  For the past 24 hours she has had severe behavioral changes and aggressive behavior at home.  She was brought into the ED for further evaluation.  Upon presentation to the ED she was agitated and received a dose of Zyprexa with improvement.  EDP contacted Dr. Marin Olp, her medical oncologist, who recommended admission and observe for 48 hours.  When she was evaluated, her mental status was better, but she seemed to be unable to focus.  She denied headache, chest pain or abdominal pain but was complaining of lower back pain.  Denied rhinorrhea, sore throat, productive cough, wheezing or hemoptysis.  No lower extremity edema.  No dysuria, frequency or hematuria.  No polyuria, polydipsia, polyphagia or blurred vision.   Assessment & Plan:   Principal Problem:   AMS (altered mental status) Active Problems:   Hyperlipidemia   Iron deficiency anemia   Hyponatremia   Brain metastasis (HCC)   Acute metabolic encephalopathy   High grade B-cell lymphoma (HCC)   Neutropenia (HCC)  Principal Problem:   Acute toxic metabolic encephalopathy Suspect related to steroid use prior to admit Plan to taper glucocorticoids as tolerated Oncology agrees with current plan Continue Zyprexa 5 mg at bedtime. Oriented x 3 and conversant. Family present at bedside states pt is not yet at baseline mentation Cont to avoid sedating meds if possible   Active Problems:   Hyponatremia Was continued on normal saline infusion. urine osmolality pending urine sodium pending Sodium improved to  133 Repeat bmet in AM     Brain metastasis (HCC) On decadron 4mg  bid prior to visit, weaned to 4mg  daily Oncology following CT head with findings of decreased density of lesions in L temporal and parietal lobes which may reflect evolving post-treatment change F/u MRI brain ordered and performed, reviewed with findings of decreased area of hyperintense T2 weighted signal in the L temporal lobe and post L parietal lobe     High grade B-cell lymphoma (HCC)   Neutropenia ([place]) Neutropenic precautions. Currently on acyclovir and PO cipro post-chemo WBC trending up Appreciate input by Oncology     Iron deficiency anemia Monitor hematocrit and hemoglobin. Hgb stable at 9.9 Iron supplementation. CBC trends reviewed, stable     Hyperlipidemia Currently not on medical therapy.    DVT prophylaxis: SCD's Code Status: Full Family Communication: Pt in room, family not at bedside  Status is: Inpatient  Remains inpatient appropriate because: Severity of illness   Consultants:  Oncology  Procedures:    Antimicrobials: Anti-infectives (From admission, onward)    Start     Dose/Rate Route Frequency Ordered Stop   01/15/21 1000  acyclovir (ZOVIRAX) 200 MG capsule 200 mg        200 mg Oral Daily 01/14/21 1747     01/15/21 0800  ciprofloxacin (CIPRO) tablet 500 mg       Note to Pharmacy: Start taking day AFTER chemo finishes for 15 days!!  You will do this with each cycle of chemotherapy!! Per Dr Jonette Eva     500 mg Oral Daily with breakfast 01/14/21 1741  01/14/21 2000  fluconazole (DIFLUCAN) tablet 100 mg        100 mg Oral Daily 01/14/21 1741     01/14/21 1830  famciclovir (FAMVIR) tablet 125 mg  Status:  Discontinued        125 mg Oral Daily 01/14/21 1741 01/14/21 1746       Subjective: Very conversant, without complaints. Denies pain at this time  Objective: Vitals:   01/15/21 1334 01/15/21 2142 01/16/21 0331 01/16/21 1228  BP: (!) 159/67 (!) 154/82 130/62 (!) 117/55   Pulse: 95 100 95 86  Resp: 16 16 18 18   Temp: 98.1 F (36.7 C) 98.5 F (36.9 C) 97.8 F (36.6 C) 98.5 F (36.9 C)  TempSrc: Oral Oral Oral Oral  SpO2: 93% 96% 95% 97%    Intake/Output Summary (Last 24 hours) at 01/16/2021 1627 Last data filed at 01/16/2021 0900 Gross per 24 hour  Intake 360 ml  Output --  Net 360 ml    There were no vitals filed for this visit.  Examination: General exam: Conversant, in no acute distress Respiratory system: normal chest rise, clear, no audible wheezing Cardiovascular system: regular rhythm, s1-s2 Gastrointestinal system: Nondistended, nontender, pos BS Central nervous system: No seizures, no tremors Extremities: No cyanosis, no joint deformities Skin: No rashes, no pallor Psychiatry: Affect normal // no auditory hallucinations   Data Reviewed: I have personally reviewed following labs and imaging studies  CBC: Recent Labs  Lab 01/13/21 1029 01/14/21 1148 01/15/21 0521 01/16/21 0437  WBC 0.8* 1.3* 1.7* 7.0  NEUTROABS 0.4* 0.3* 0.5* 4.5  HGB 10.8* 11.1* 10.5* 9.9*  HCT 32.9* 33.9* 32.4* 31.4*  MCV 75.8* 75.3* 75.7* 77.5*  PLT 163 142* 120* 112*    Basic Metabolic Panel: Recent Labs  Lab 01/13/21 1029 01/14/21 1148 01/15/21 0521 01/16/21 0437  NA 130* 127* 130* 133*  K 4.0 3.5 3.4* 3.8  CL 93* 92* 96* 102  CO2 32 27 28 26   GLUCOSE 157* 122* 93 88  BUN 14 15 8 9   CREATININE 0.48 0.44 0.34* 0.34*  CALCIUM 9.6 9.1 8.4* 8.6*    GFR: CrCl cannot be calculated (Unknown ideal weight.). Liver Function Tests: Recent Labs  Lab 01/13/21 1029 01/14/21 1148 01/15/21 0521 01/16/21 0437  AST 18 26 24 26   ALT 56* 55* 59* 67*  ALKPHOS 72 72 77 121  BILITOT 0.4 1.0 0.7 0.8  PROT 6.6 6.3* 5.7* 5.5*  ALBUMIN 3.5 3.1* 2.7* 2.5*    No results for input(s): LIPASE, AMYLASE in the last 168 hours. Recent Labs  Lab 01/14/21 1203 01/15/21 0521  AMMONIA 37* 32    Coagulation Profile: No results for input(s): INR, PROTIME  in the last 168 hours. Cardiac Enzymes: No results for input(s): CKTOTAL, CKMB, CKMBINDEX, TROPONINI in the last 168 hours. BNP (last 3 results) No results for input(s): PROBNP in the last 8760 hours. HbA1C: No results for input(s): HGBA1C in the last 72 hours. CBG: Recent Labs  Lab 01/15/21 0719  GLUCAP 78    Lipid Profile: No results for input(s): CHOL, HDL, LDLCALC, TRIG, CHOLHDL, LDLDIRECT in the last 72 hours. Thyroid Function Tests: No results for input(s): TSH, T4TOTAL, FREET4, T3FREE, THYROIDAB in the last 72 hours. Anemia Panel: No results for input(s): VITAMINB12, FOLATE, FERRITIN, TIBC, IRON, RETICCTPCT in the last 72 hours. Sepsis Labs: Recent Labs  Lab 01/14/21 1148  LATICACIDVEN 2.3*     Recent Results (from the past 240 hour(s))  Resp Panel by RT-PCR (Flu A&B, Covid) Nasopharyngeal Swab  Status: None   Collection Time: 01/14/21  2:09 PM   Specimen: Nasopharyngeal Swab; Nasopharyngeal(NP) swabs in vial transport medium  Result Value Ref Range Status   SARS Coronavirus 2 by RT PCR NEGATIVE NEGATIVE Final    Comment: (NOTE) SARS-CoV-2 target nucleic acids are NOT DETECTED.  The SARS-CoV-2 RNA is generally detectable in upper respiratory specimens during the acute phase of infection. The lowest concentration of SARS-CoV-2 viral copies this assay can detect is 138 copies/mL. A negative result does not preclude SARS-Cov-2 infection and should not be used as the sole basis for treatment or other patient management decisions. A negative result may occur with  improper specimen collection/handling, submission of specimen other than nasopharyngeal swab, presence of viral mutation(s) within the areas targeted by this assay, and inadequate number of viral copies(<138 copies/mL). A negative result must be combined with clinical observations, patient history, and epidemiological information. The expected result is Negative.  Fact Sheet for Patients:   EntrepreneurPulse.com.au  Fact Sheet for Healthcare Providers:  IncredibleEmployment.be  This test is no t yet approved or cleared by the [place] FDA and  has been authorized for detection and/or diagnosis of SARS-CoV-2 by FDA under an Emergency Use Authorization (EUA). This EUA will remain  in effect (meaning this test can be used) for the duration of the COVID-19 declaration under Section 564(b)(1) of the Act, 21 U.S.C.section 360bbb-3(b)(1), unless the authorization is terminated  or revoked sooner.       Influenza A by PCR NEGATIVE NEGATIVE Final   Influenza B by PCR NEGATIVE NEGATIVE Final    Comment: (NOTE) The Xpert Xpress SARS-CoV-2/FLU/RSV plus assay is intended as an aid in the diagnosis of influenza from Nasopharyngeal swab specimens and should not be used as a sole basis for treatment. Nasal washings and aspirates are unacceptable for Xpert Xpress SARS-CoV-2/FLU/RSV testing.  Fact Sheet for Patients: EntrepreneurPulse.com.au  Fact Sheet for Healthcare Providers: IncredibleEmployment.be  This test is not yet approved or cleared by the [place] FDA and has been authorized for detection and/or diagnosis of SARS-CoV-2 by FDA under an Emergency Use Authorization (EUA). This EUA will remain in effect (meaning this test can be used) for the duration of the COVID-19 declaration under Section 564(b)(1) of the Act, 21 U.S.C. section 360bbb-3(b)(1), unless the authorization is terminated or revoked.  Performed at [facility], [address] [place], [place] [address]       Radiology Studies: MR BRAIN W WO CONTRAST  Result Date: 01/15/2021 CLINICAL DATA:  CNS lymphoma staging EXAM: MRI HEAD WITHOUT AND WITH CONTRAST TECHNIQUE: Multiplanar, multiecho pulse sequences of the brain and surrounding structures were obtained without and with intravenous contrast. CONTRAST:  6.64mL  GADAVIST GADOBUTROL 1 MMOL/ML IV SOLN COMPARISON:  None. FINDINGS: Brain: No acute infarct, mass effect or extra-axial collection. Extensive susceptibility effect throughout the brain is likely due to iron infusion. Area of hyperintense T2-weighted signal within the left temporal lobe with associated edema has decreased. The hyperintense T2-weighted signal lesion of the posterior left parietal lobe is also smaller. Despite administration of intravenous contrast agent, it does not appear that any contrast agent reached the head. This limits assessment for enhancement. Vascular: Major flow voids are preserved. Skull and upper cervical spine: Normal calvarium and skull base. Visualized upper cervical spine and soft tissues are normal. Sinuses/Orbits:No paranasal sinus fluid levels or advanced mucosal thickening. No mastoid or middle ear effusion. Normal orbits. IMPRESSION: 1. Decreased area of hyperintense T2-weighted signal in the left temporal  lobe and posterior left parietal lobe. 2. Poor contrast bolus prevents assessment for residual enhancement. A repeat MRI with contrast may be helpful. 3. Extensive susceptibility effect throughout the brain likely due to iron infusion. Electronically Signed   By: Ulyses Jarred M.D.   On: 01/15/2021 21:22    Scheduled Meds:  acyclovir  200 mg Oral Daily   Chlorhexidine Gluconate Cloth  6 each Topical Daily   ciprofloxacin  500 mg Oral Q breakfast   dexamethasone  4 mg Oral Daily   fluconazole  100 mg Oral Daily   levETIRAcetam  500 mg Oral BID   OLANZapine zydis  5 mg Oral QHS   Continuous Infusions:  0.9 % NaCl with KCl 20 mEq / L 100 mL/hr at 01/16/21 1344     LOS: 2 days   Marylu Lund, MD [facility] Pager On Amion  If 7PM-7AM, please contact night-coverage 01/16/2021, 4:27 PM

## 2021-01-16 NOTE — Evaluation (Signed)
Physical Therapy Evaluation Patient Details Name: Ariel [name] MRN: [id number] DOB: 1955-07-19 Today's Date: 01/16/2021  History of Present Illness  Ariel [name] is a 65 y.o. female with medical history significant of HLD, IDA, TIA in 09/2020, melanoma of the right hip 2021 stage IB and vitreous hemorrhage of the right eye s/p vitrectomy 12/07/20 diagnosed 12/15/20 with metastatic B-cell lymphoma including metastatic brain disease to L temporal and parietal lobes.  Pt admitted from home 01/14/21 with AMS.  Clinical Impression  Pt admitted as above and presenting with functional mobility limitations 2* generalized weakness, ambulatory balance deficits and questionable safety awareness. Pt very pleasant and cooperative but requiring cues to focus on task at hand.  Pt supervision to min assist for safe performance of basic mobility tasks and reports she has 24/7 care at home.  Pt hopes to be able to progress back to previous living arrangement which is appropriate if 24/7 continues to be available.     Recommendations for follow up therapy are one component of a multi-disciplinary discharge planning process, led by the attending physician.  Recommendations may be updated based on patient status, additional functional criteria and insurance authorization.  Follow Up Recommendations Home health PT (unless family can no longer provide 24/7, then will require follow up at SNF level)    Assistance Recommended at Discharge Frequent or constant Supervision/Assistance  Functional Status Assessment Patient has had a recent decline in their functional status and demonstrates the ability to make significant improvements in function in a reasonable and predictable amount of time.  Equipment Recommendations  None recommended by PT    Recommendations for Other Services       Precautions / Restrictions Precautions Precautions: Fall Restrictions Weight Bearing Restrictions: No      Mobility  Bed  Mobility               General bed mobility comments: Pt up in chair and requests back to same    Transfers Overall transfer level: Needs assistance Equipment used: None Transfers: Sit to/from Stand Sit to Stand: Min assist           General transfer comment: steady assist only    Ambulation/Gait Ambulation/Gait assistance: Min assist Gait Distance (Feet): 400 Feet Assistive device: Rolling walker (2 wheels);IV Pole Gait Pattern/deviations: Step-through pattern;Decreased step length - right;Decreased step length - left;Shuffle;Trunk flexed Gait velocity: mod pace with use of RW     General Gait Details: Pt requiring min assist for balance sans AD but min quard with use of IV pole or RW  Stairs            Wheelchair Mobility    Modified Rankin (Stroke Patients Only)       Balance Overall balance assessment: Needs assistance Sitting-balance support: No upper extremity supported;Feet supported Sitting balance-Leahy Scale: Good     Standing balance support: Single extremity supported Standing balance-Leahy Scale: Poor                               Pertinent Vitals/Pain Pain Assessment: No/denies pain    Home Living Family/patient expects to be discharged to:: Private residence Living Arrangements: Alone Available Help at Discharge: Family;Personal care attendant;Available 24 hours/day Type of Home: House Home Access: Stairs to enter   [employer] of Steps: 1   Home Layout: Two level;Able to live on main level with bedroom/bathroom Home Equipment: Shower seat;Grab bars - toilet;Grab bars - tub/shower;Rolling Walker (2 wheels) (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 and has equipment from that time) Additional Comments: Pt reports she has 24/7 caregivers including family members    Prior Function Prior Level of Function : Needs assist  Cognitive Assist : Mobility (cognitive)     Physical Assist : Mobility  (physical) Mobility (physical): Gait   Mobility Comments: Pt reports using cane or RW as needed for mobiltiy ADLs Comments: Pt reports 24/7 care givers to assist as needed     Hand Dominance   Dominant Hand: Right    Extremity/Trunk Assessment   Upper Extremity Assessment Upper Extremity Assessment: Generalized weakness    Lower Extremity Assessment Lower Extremity Assessment: Generalized weakness    Cervical / Trunk Assessment Cervical / Trunk Assessment: Normal  Communication   Communication: No difficulties  Cognition Arousal/Alertness: Awake/alert Behavior During Therapy: Impulsive Overall Cognitive Status: History of cognitive impairments - at baseline                                 General Comments: Pt with very tangential thinking and conversation; requires cues to focus on task at home        General Comments      Exercises     Assessment/Plan    PT Assessment Patient needs continued PT services  PT Problem List Decreased strength;Decreased activity tolerance;Decreased balance;Decreased mobility;Decreased knowledge of use of DME       PT Treatment Interventions DME instruction;Gait training;Stair training;Functional mobility training;Therapeutic activities;Therapeutic exercise;Balance training;Cognitive remediation;Patient/family education    PT Goals (Current goals can be found in the Care Plan section)  Acute Rehab PT Goals Patient Stated Goal: Regain IND PT Goal Formulation: With patient Time For Goal Achievement: 01/30/21 Potential to Achieve Goals: Fair    Frequency Min 3X/week   Barriers to discharge Decreased caregiver support unsure if family can continue with 24/7 per nursing    Co-evaluation               AM-PAC PT "6 Clicks" Mobility  Outcome Measure Help needed turning from your back to your side while in a flat bed without using bedrails?: None Help needed moving from lying on your back to sitting on the side  of a flat bed without using bedrails?: A Little Help needed moving to and from a bed to a chair (including a wheelchair)?: A Little Help needed standing up from a chair using your arms (e.g., wheelchair or bedside chair)?: A Little Help needed to walk in hospital room?: A Little Help needed climbing 3-5 steps with a railing? : A Little 6 Click Score: 19    End of Session Equipment Utilized During Treatment: Gait belt Activity Tolerance: Patient tolerated treatment well Patient left: in chair;with call bell/phone within reach;with chair alarm set Nurse Communication: Mobility status PT Visit Diagnosis: Difficulty in walking, not elsewhere classified (R26.2);Unsteadiness on feet (R26.81);Muscle weakness (generalized) (M62.81)    Time: 1740-8144 PT Time Calculation (min) (ACUTE ONLY): 28 min   Charges:   PT Evaluation $PT Eval Low Complexity: 1 Low PT Treatments $Gait Training: 8-22 mins        [place] Pager [phone] Office [phone]   Temika Sutphin 01/16/2021, 3:26 PM

## 2021-01-17 ENCOUNTER — Inpatient Hospital Stay: Payer: Medicare Other

## 2021-01-17 DIAGNOSIS — C7931 Secondary malignant neoplasm of brain: Secondary | ICD-10-CM | POA: Diagnosis not present

## 2021-01-17 DIAGNOSIS — R4182 Altered mental status, unspecified: Secondary | ICD-10-CM | POA: Diagnosis not present

## 2021-01-17 DIAGNOSIS — C8338 Diffuse large B-cell lymphoma, lymph nodes of multiple sites: Secondary | ICD-10-CM | POA: Diagnosis not present

## 2021-01-17 LAB — COMPREHENSIVE METABOLIC PANEL
ALT: 69 U/L — ABNORMAL HIGH (ref 0–44)
AST: 31 U/L (ref 15–41)
Albumin: 2.8 g/dL — ABNORMAL LOW (ref 3.5–5.0)
Alkaline Phosphatase: 196 U/L — ABNORMAL HIGH (ref 38–126)
Anion gap: 9 (ref 5–15)
BUN: 10 mg/dL (ref 8–23)
CO2: 23 mmol/L (ref 22–32)
Calcium: 8.6 mg/dL — ABNORMAL LOW (ref 8.9–10.3)
Chloride: 101 mmol/L (ref 98–111)
Creatinine, Ser: 0.37 mg/dL — ABNORMAL LOW (ref 0.44–1.00)
GFR, Estimated: >60 mL/min (ref >60)
Glucose, Bld: 165 mg/dL — ABNORMAL HIGH (ref 70–99)
Potassium: 3.8 mmol/L (ref 3.5–5.1)
Sodium: 133 mmol/L — ABNORMAL LOW (ref 135–145)
Total Bilirubin: 0.9 mg/dL (ref 0.3–1.2)
Total Protein: 6 g/dL — ABNORMAL LOW (ref 6.5–8.1)

## 2021-01-17 LAB — CBC WITH DIFFERENTIAL/PLATELET
Abs Immature Granulocytes: 0.6 10*3/uL — ABNORMAL HIGH (ref 0.00–0.07)
Band Neutrophils: 5 %
Basophils Absolute: 0 10*3/uL (ref 0.0–0.1)
Basophils Relative: 0 %
Eosinophils Absolute: 0 10*3/uL (ref 0.0–0.5)
Eosinophils Relative: 0 %
HCT: 32.8 % — ABNORMAL LOW (ref 36.0–46.0)
Hemoglobin: 10.5 g/dL — ABNORMAL LOW (ref 12.0–15.0)
Lymphocytes Relative: 8 %
Lymphs Abs: 1.5 10*3/uL (ref 0.7–4.0)
MCH: 25.1 pg — ABNORMAL LOW (ref 26.0–34.0)
MCHC: 32 g/dL (ref 30.0–36.0)
MCV: 78.3 fL — ABNORMAL LOW (ref 80.0–100.0)
Metamyelocytes Relative: 1 %
Monocytes Absolute: 3 10*3/uL — ABNORMAL HIGH (ref 0.1–1.0)
Monocytes Relative: 16 %
Myelocytes: 2 %
Neutro Abs: 13.9 10*3/uL — ABNORMAL HIGH (ref 1.7–7.7)
Neutrophils Relative %: 68 %
Platelets: 113 10*3/uL — ABNORMAL LOW (ref 150–400)
RBC: 4.19 MIL/uL (ref 3.87–5.11)
RDW: 19.4 % — ABNORMAL HIGH (ref 11.5–15.5)
WBC: 19 10*3/uL — ABNORMAL HIGH (ref 4.0–10.5)
nRBC: 0.3 % — ABNORMAL HIGH (ref 0.0–0.2)

## 2021-01-17 MED ORDER — DEXAMETHASONE 4 MG PO TABS
2.0000 mg | ORAL_TABLET | Freq: Every day | ORAL | Status: DC
  Administered 2021-01-17 – 2021-01-21 (×5): 2 mg via ORAL
  Filled 2021-01-17 (×5): qty 1

## 2021-01-17 MED ORDER — ENOXAPARIN SODIUM 40 MG/0.4ML IJ SOSY
40.0000 mg | PREFILLED_SYRINGE | INTRAMUSCULAR | Status: DC
  Administered 2021-01-17 – 2021-01-30 (×13): 40 mg via SUBCUTANEOUS
  Filled 2021-01-17 (×14): qty 0.4

## 2021-01-17 NOTE — Progress Notes (Signed)
PT Cancellation Note  Patient Details Name: Ariel [name] MRN: [id number] DOB: 1955/07/06   Cancelled Treatment:    Reason Eval/Treat Not Completed: Other (comment) (RN was cutting the patient's hair. Pt was priority 2 today so we will check back tomorrow.)   Lelon Mast 01/17/2021, 12:58 PM

## 2021-01-17 NOTE — Progress Notes (Signed)
This nurse did not do CHG bath due to the [facility] a  cath had not been accessed yet. NO need to do the CHG bath prior.

## 2021-01-17 NOTE — Progress Notes (Signed)
PROGRESS NOTE    Ann Bohne  JOA:[id number] DOB: 1955-11-04 DOA: 01/14/2021 PCP: [name], NP    Brief Narrative:  65 year old female with history of metastatic B-cell lymphoma with brain mets who presented from home due to progressively worsening altered mental status.  Patient was started on Decadron 12 weeks ago and has been having decline of her mentation.  She is currently on Decadron 5 mg twice daily.  For the past 24 hours she has had severe behavioral changes and aggressive behavior at home.  She was brought into the ED for further evaluation.  Upon presentation to the ED she was agitated and received a dose of Zyprexa with improvement.  EDP contacted Dr. Marin Olp, her medical oncologist, who recommended admission and observe for 48 hours.  When she was evaluated, her mental status was better, but she seemed to be unable to focus.  She denied headache, chest pain or abdominal pain but was complaining of lower back pain.  Denied rhinorrhea, sore throat, productive cough, wheezing or hemoptysis.  No lower extremity edema.  No dysuria, frequency or hematuria.  No polyuria, polydipsia, polyphagia or blurred vision.   Assessment & Plan:   Principal Problem:   AMS (altered mental status) Active Problems:   Hyperlipidemia   Iron deficiency anemia   Hyponatremia   Brain metastasis (HCC)   Acute metabolic encephalopathy   High grade B-cell lymphoma (HCC)   Neutropenia (HCC)  Principal Problem:   Acute toxic metabolic encephalopathy Suspect related to steroid use prior to admit Plan to taper glucocorticoids as tolerated Oncology agrees with current plan Continue Zyprexa 5 mg at bedtime. Oriented x 3 and conversant. Family present at bedside states pt is not yet at baseline mentation, very talkative and tangential in conversation Cont to avoid sedating meds if possible   Active Problems:   Hyponatremia Was continued on normal saline infusion. urine osmolality  pending urine sodium pending Sodium improved stable at 133 Repeat bmet in AM     Brain metastasis (HCC) On decadron 4mg  bid prior to visit, weaned to 4mg  daily Oncology following CT head with findings of decreased density of lesions in L temporal and parietal lobes which may reflect evolving post-treatment change F/u MRI brain ordered and performed, reviewed with findings of decreased area of hyperintense T2 weighted signal in the L temporal lobe and post L parietal lobe MRI revealing CNS lymphoma areas improving     High grade B-cell lymphoma (HCC)   Neutropenia ([place]) Neutropenic precautions. Currently on acyclovir and PO cipro post-chemo WBC tup to 19k, likely from neulasta Appreciate input by Oncology     Iron deficiency anemia Monitor hematocrit and hemoglobin. Hgb stable at 10.5 Iron supplementation. CBC trends reviewed, stable     Hyperlipidemia Currently not on medical therapy.   DVT prophylaxis: SCD's Code Status: Full Family Communication: Pt in room, family currently at bedside  Status is: Inpatient  Remains inpatient appropriate because: Severity of illness   Consultants:  Oncology  Procedures:    Antimicrobials: Anti-infectives (From admission, onward)    Start     Dose/Rate Route Frequency Ordered Stop   01/15/21 1000  acyclovir (ZOVIRAX) 200 MG capsule 200 mg        200 mg Oral Daily 01/14/21 1747     01/15/21 0800  ciprofloxacin (CIPRO) tablet 500 mg  Status:  Discontinued       Note to Pharmacy: Start taking day AFTER chemo finishes for 15 days!!  You will do this with each cycle of  chemotherapy!! Per Dr Jonette Eva     500 mg Oral Daily with breakfast 01/14/21 1741 01/17/21 0724   01/14/21 2000  fluconazole (DIFLUCAN) tablet 100 mg        100 mg Oral Daily 01/14/21 1741     01/14/21 1830  famciclovir (FAMVIR) tablet 125 mg  Status:  Discontinued        125 mg Oral Daily 01/14/21 1741 01/14/21 1746       Subjective: Asking for hair to be shaved  off. Noticed hair had been falling off in clumps  Objective: Vitals:   01/16/21 1228 01/16/21 2039 01/17/21 0538 01/17/21 1418  BP: (!) 117/55 135/66 128/62 130/69  Pulse: 86 80 82 96  Resp: 18 16 16 20   Temp: 98.5 F (36.9 C) 98.1 F (36.7 C) 98.9 F (37.2 C) 99.2 F (37.3 C)  TempSrc: Oral Oral Oral Oral  SpO2: 97% 95% 98% 98%    Intake/Output Summary (Last 24 hours) at 01/17/2021 1553 Last data filed at 01/17/2021 0949 Gross per 24 hour  Intake 4946.24 ml  Output --  Net 4946.24 ml    There were no vitals filed for this visit.  Examination: General exam: Awake, laying in bed, in nad Respiratory system: Normal respiratory effort, no wheezing Cardiovascular system: regular rate, s1, s2 Gastrointestinal system: Soft, nondistended, positive BS Central nervous system: CN2-12 grossly intact, strength intact Extremities: Perfused, no clubbing Skin: Normal skin turgor, no notable skin lesions seen Psychiatry: Mood normal // no visual hallucinations   Data Reviewed: I have personally reviewed following labs and imaging studies  CBC: Recent Labs  Lab 01/13/21 1029 01/14/21 1148 01/15/21 0521 01/16/21 0437 01/17/21 0230  WBC 0.8* 1.3* 1.7* 7.0 19.0*  NEUTROABS 0.4* 0.3* 0.5* 4.5 13.9*  HGB 10.8* 11.1* 10.5* 9.9* 10.5*  HCT 32.9* 33.9* 32.4* 31.4* 32.8*  MCV 75.8* 75.3* 75.7* 77.5* 78.3*  PLT 163 142* 120* 112* 113*    Basic Metabolic Panel: Recent Labs  Lab 01/13/21 1029 01/14/21 1148 01/15/21 0521 01/16/21 0437 01/17/21 0230  NA 130* 127* 130* 133* 133*  K 4.0 3.5 3.4* 3.8 3.8  CL 93* 92* 96* 102 101  CO2 32 27 28 26 23   GLUCOSE 157* 122* 93 88 165*  BUN 14 15 8 9 10   CREATININE 0.48 0.44 0.34* 0.34* 0.37*  CALCIUM 9.6 9.1 8.4* 8.6* 8.6*    GFR: CrCl cannot be calculated (Unknown ideal weight.). Liver Function Tests: Recent Labs  Lab 01/13/21 1029 01/14/21 1148 01/15/21 0521 01/16/21 0437 01/17/21 0230  AST 18 26 24 26 31   ALT 56* 55* 59*  67* 69*  ALKPHOS 72 72 77 121 196*  BILITOT 0.4 1.0 0.7 0.8 0.9  PROT 6.6 6.3* 5.7* 5.5* 6.0*  ALBUMIN 3.5 3.1* 2.7* 2.5* 2.8*    No results for input(s): LIPASE, AMYLASE in the last 168 hours. Recent Labs  Lab 01/14/21 1203 01/15/21 0521  AMMONIA 37* 32    Coagulation Profile: No results for input(s): INR, PROTIME in the last 168 hours. Cardiac Enzymes: No results for input(s): CKTOTAL, CKMB, CKMBINDEX, TROPONINI in the last 168 hours. BNP (last 3 results) No results for input(s): PROBNP in the last 8760 hours. HbA1C: No results for input(s): HGBA1C in the last 72 hours. CBG: Recent Labs  Lab 01/15/21 0719  GLUCAP 78    Lipid Profile: No results for input(s): CHOL, HDL, LDLCALC, TRIG, CHOLHDL, LDLDIRECT in the last 72 hours. Thyroid Function Tests: No results for input(s): TSH, T4TOTAL, FREET4, T3FREE, THYROIDAB in  the last 72 hours. Anemia Panel: No results for input(s): VITAMINB12, FOLATE, FERRITIN, TIBC, IRON, RETICCTPCT in the last 72 hours. Sepsis Labs: Recent Labs  Lab 01/14/21 1148  LATICACIDVEN 2.3*     Recent Results (from the past 240 hour(s))  Resp Panel by RT-PCR (Flu A&B, Covid) Nasopharyngeal Swab     Status: None   Collection Time: 01/14/21  2:09 PM   Specimen: Nasopharyngeal Swab; Nasopharyngeal(NP) swabs in vial transport medium  Result Value Ref Range Status   SARS Coronavirus 2 by RT PCR NEGATIVE NEGATIVE Final    Comment: (NOTE) SARS-CoV-2 target nucleic acids are NOT DETECTED.  The SARS-CoV-2 RNA is generally detectable in upper respiratory specimens during the acute phase of infection. The lowest concentration of SARS-CoV-2 viral copies this assay can detect is 138 copies/mL. A negative result does not preclude SARS-Cov-2 infection and should not be used as the sole basis for treatment or other patient management decisions. A negative result may occur with  improper specimen collection/handling, submission of specimen other than  nasopharyngeal swab, presence of viral mutation(s) within the areas targeted by this assay, and inadequate number of viral copies(<138 copies/mL). A negative result must be combined with clinical observations, patient history, and epidemiological information. The expected result is Negative.  Fact Sheet for Patients:  EntrepreneurPulse.com.au  Fact Sheet for Healthcare Providers:  IncredibleEmployment.be  This test is no t yet approved or cleared by the [place] FDA and  has been authorized for detection and/or diagnosis of SARS-CoV-2 by FDA under an Emergency Use Authorization (EUA). This EUA will remain  in effect (meaning this test can be used) for the duration of the COVID-19 declaration under Section 564(b)(1) of the Act, 21 U.S.C.section 360bbb-3(b)(1), unless the authorization is terminated  or revoked sooner.       Influenza A by PCR NEGATIVE NEGATIVE Final   Influenza B by PCR NEGATIVE NEGATIVE Final    Comment: (NOTE) The Xpert Xpress SARS-CoV-2/FLU/RSV plus assay is intended as an aid in the diagnosis of influenza from Nasopharyngeal swab specimens and should not be used as a sole basis for treatment. Nasal washings and aspirates are unacceptable for Xpert Xpress SARS-CoV-2/FLU/RSV testing.  Fact Sheet for Patients: EntrepreneurPulse.com.au  Fact Sheet for Healthcare Providers: IncredibleEmployment.be  This test is not yet approved or cleared by the [place] FDA and has been authorized for detection and/or diagnosis of SARS-CoV-2 by FDA under an Emergency Use Authorization (EUA). This EUA will remain in effect (meaning this test can be used) for the duration of the COVID-19 declaration under Section 564(b)(1) of the Act, 21 U.S.C. section 360bbb-3(b)(1), unless the authorization is terminated or revoked.  Performed at [facility], [address], [place], [place]  [address]       Radiology Studies: No results found.  Scheduled Meds:  acyclovir  200 mg Oral Daily   Chlorhexidine Gluconate Cloth  6 each Topical Daily   dexamethasone  2 mg Oral Daily   enoxaparin (LOVENOX) injection  40 mg Subcutaneous Q24H   fluconazole  100 mg Oral Daily   levETIRAcetam  500 mg Oral BID   OLANZapine zydis  5 mg Oral QHS   Continuous Infusions:  0.9 % NaCl with KCl 20 mEq / L 100 mL/hr at 01/17/21 0835     LOS: 3 days   Marylu Lund, MD [facility] Pager On Amion  If 7PM-7AM, please contact night-coverage 01/17/2021, 3:53 PM

## 2021-01-17 NOTE — Progress Notes (Signed)
Overall, Ariel [name] might be a little bit better neurologically.  She says her hair is starting to come out.  Really not surprised by this.  She did have the MRI of the brain done.  The test was little bit suboptimal because of the IV contrast but it looks like the tumors in the brain were smaller.  There is less edema.  She is still on some IV fluids.  I think she is getting some physical therapy.  Again, she still has a little bit of tangential thought.  Her white cell count is 19,000.  I think this is from the Neulasta that she received.  Of note, she really not complaining much in the way of back discomfort.  Her hemoglobin is 10.5.  Platelet count 113,000.  The sodium is 133.  Potassium 3.8.  Blood sugar 165.  BUN 10 creatinine 0.37.  Calcium is 8.6 with an albumin of 2.8.  I think she is eating okay.  There is no obvious nausea or vomiting.  I do not think there is diarrhea.  Her vital signs show temperature of 98.9.  Pulse 82.  Blood pressure 128/62.  Her oral exam shows healing oral ulcer in the left buccal mucosa.  Lungs are clear.  Cardiac exam regular rate and rhythm.  Abdomen is soft.  Extremity shows no clubbing, cyanosis or edema.  She moves all extremities well with good strength that is symmetric in upper and lower extremities.  Neurological exam shows no focal neurological deficits.  Hopefully, will continue to see some improvement in her overall mental state.  Again she seems better than when I saw her on Saturday.  The MRI showed that the CNS lymphoma areas are improved.  As such, the chemotherapy is helping.  It would be nice if she could go home at some point.  I do appreciate the incredible care that the staff up [address] are given her.  I know that the staff is so compassionate and very professional.  [name], MD  Psalm 100:4

## 2021-01-17 NOTE — Progress Notes (Incomplete)
Patient is becoming increasingly erratic and hostile.

## 2021-01-18 DIAGNOSIS — C7931 Secondary malignant neoplasm of brain: Secondary | ICD-10-CM | POA: Diagnosis not present

## 2021-01-18 DIAGNOSIS — D509 Iron deficiency anemia, unspecified: Secondary | ICD-10-CM

## 2021-01-18 DIAGNOSIS — G9341 Metabolic encephalopathy: Secondary | ICD-10-CM | POA: Diagnosis not present

## 2021-01-18 DIAGNOSIS — Z85820 Personal history of malignant melanoma of skin: Secondary | ICD-10-CM

## 2021-01-18 DIAGNOSIS — R402424 Glasgow coma scale score 9-12, 24 hours or more after hospital admission: Secondary | ICD-10-CM

## 2021-01-18 DIAGNOSIS — C8338 Diffuse large B-cell lymphoma, lymph nodes of multiple sites: Secondary | ICD-10-CM | POA: Diagnosis not present

## 2021-01-18 LAB — CBC WITH DIFFERENTIAL/PLATELET
Abs Immature Granulocytes: 5.62 10*3/uL — ABNORMAL HIGH (ref 0.00–0.07)
Basophils Absolute: 0 10*3/uL (ref 0.0–0.1)
Basophils Relative: 0 %
Eosinophils Absolute: 0 10*3/uL (ref 0.0–0.5)
Eosinophils Relative: 0 %
HCT: 32.1 % — ABNORMAL LOW (ref 36.0–46.0)
Hemoglobin: 10.2 g/dL — ABNORMAL LOW (ref 12.0–15.0)
Immature Granulocytes: 22 %
Lymphocytes Relative: 7 %
Lymphs Abs: 1.8 10*3/uL (ref 0.7–4.0)
MCH: 24.8 pg — ABNORMAL LOW (ref 26.0–34.0)
MCHC: 31.8 g/dL (ref 30.0–36.0)
MCV: 77.9 fL — ABNORMAL LOW (ref 80.0–100.0)
Monocytes Absolute: 2.3 10*3/uL — ABNORMAL HIGH (ref 0.1–1.0)
Monocytes Relative: 9 %
Neutro Abs: 16 10*3/uL — ABNORMAL HIGH (ref 1.7–7.7)
Neutrophils Relative %: 62 %
Platelets: 107 10*3/uL — ABNORMAL LOW (ref 150–400)
RBC: 4.12 MIL/uL (ref 3.87–5.11)
RDW: 19.9 % — ABNORMAL HIGH (ref 11.5–15.5)
WBC: 25.8 10*3/uL — ABNORMAL HIGH (ref 4.0–10.5)
nRBC: 0.6 % — ABNORMAL HIGH (ref 0.0–0.2)

## 2021-01-18 MED ORDER — HALOPERIDOL LACTATE 5 MG/ML IJ SOLN
2.0000 mg | Freq: Four times a day (QID) | INTRAMUSCULAR | Status: DC | PRN
  Administered 2021-01-18 – 2021-01-19 (×2): 2 mg via INTRAMUSCULAR
  Filled 2021-01-18 (×2): qty 1

## 2021-01-18 MED ORDER — HALOPERIDOL LACTATE 5 MG/ML IJ SOLN: 2.0000 mg | Freq: Four times a day (QID) | INTRAMUSCULAR | Status: DC | PRN

## 2021-01-18 NOTE — TOC Initial Note (Signed)
Transition of Care [facility]) - Initial/Assessment Note    Patient Details  Name: Ariel [name] MRN: [id number] Date of Birth: [birth year]  [facility]) CM/SW Contact:    Ariel Mage, LCSW Phone Number: 01/18/2021, 9:06 AM  Clinical Narrative:    Called son in follow up to PT recommendation of 24 hr care/SNF for patient who is confused.  Mr Rothgeb confirmed that [name] has had 24 hr care in the home, but they can no longer handle her in that setting due to her increasing confusion and inability to assist in her care since her last hospitalization in October of this year.  He is both interested in SNF and getting more information re: the trajectory of her illness.  He requested that I ask oncology to follow up with his sister, [facility] POA, and I sent a secure chat request to Dr Marin Olp.  Explained bed search process and insurance authorization request process.  Bed search initiated. TOC will continue to follow during the course of hospitalization.                Expected Discharge Plan: Skilled Nursing Facility Barriers to Discharge: SNF Pending bed offer   Patient Goals and CMS Choice     Choice offered to / list presented to : Adult Children  Expected Discharge Plan and Services Expected Discharge Plan: [place]   Discharge Planning Services: CM Consult Post Acute Care Choice: [place] Living arrangements for the past 2 months: Single Family Home                                      Prior Living Arrangements/Services Living arrangements for the past 2 months: Single Family Home Lives with:: Other (Comment) (24 hr caregiver) Patient language and need for interpreter reviewed:: Yes        Need for Family Participation in Patient Care: Yes (Comment) Care giver support system in place?: Yes (comment) Current home services: DME, Homehealth aide Criminal Activity/Legal Involvement Pertinent to Current Situation/Hospitalization: No -  Comment as needed  Activities of Daily Living      Permission Sought/Granted Permission sought to share information with : Family Supports Permission granted to share information with : No  Share Information with NAME: Ariel [name], Ariel [name] (Son)   [phone]  Permission granted to share info w [place] Daughter [phone] [facility] POA        Emotional Assessment       Orientation: : Oriented to Self Alcohol / Substance Use: Not Applicable Psych Involvement: No (comment)  Admission diagnosis:  AMS (altered mental status) [R41.82] Patient Active Problem List   Diagnosis Date Noted   AMS (altered mental status) 01/14/2021   Neutropenia ([place]) 01/14/2021   High grade B-cell lymphoma ([place]) 12/22/2020   Goals of care, counseling/discussion 48/54/6270   Acute metabolic encephalopathy 35/00/9381   Brain metastasis ([place]) 12/09/2020   Brain tumor ([place]) 12/08/2020   Hyponatremia 12/08/2020   Night sweat 11/30/2020   Chicken pox 82/99/3716   Complication of anesthesia 11/22/2020   PONV (postoperative nausea and vomiting) 11/22/2020   Skin rash 11/10/2020   Tachycardia 11/10/2020   Anemia 11/10/2020   Hyperglycemia 11/10/2020   TIA (transient ischemic attack) 10/07/2020   Iron deficiency anemia 10/07/2020   Aphasia 10/06/2020   Melanoma ([place]) 2021   Multinodular goiter 12/22/2013   Subclinical hyperthyroidism 12/19/2013   Urinary incontinence 11/19/2013   Hyperlipidemia 11/19/2012  Routine general medical examination at a health care facility 11/18/2012   PCP:  [name], NP Pharmacy:   [facility], [place] - [address] [place] [address] Phone: [phone] Fax: [phone]     Social Determinants of Health (SDOH) Interventions    Readmission Risk Interventions No flowsheet data found.

## 2021-01-18 NOTE — Progress Notes (Signed)
Ariel [name] does seem to be a little bit better this morning.  I saw her walking in the hallway.  She certainly seemed more alert.  We had a very nice conversation.  She does still tend to have a little bit of tangential thought.  She does talk a lot about her counselor.  She is not hurting.  She is eating well.  She is having no problems with nausea or vomiting.  There is no fever.  She is having no bleeding.  There is no visual changes.  So far, no lab work is back yet today.  On exam, her vital signs are temperature 98.7.  Pulse 94.  Her blood pressure is 150/78.  Her oral exam does not show any obvious mucositis.  Her lungs are clear.  Cardiac exam regular rate and rhythm.  Abdomen is soft.  She has no guarding or rebound tenderness.  Extremity shows no clubbing, cyanosis or edema.  Neurological exam shows no focal neurological deficits.  Again, she does seem to be a little bit better today from a cognitive point of view.  She knew what day it was.  She knew where she was.  She knows that she has lymphoma and has had chemotherapy.  Her hair is falling out.  Sounds like she may go to skilled nursing.  I do not think this would be a bad idea for her.  I know that physical therapy is working with her.  In talking to the family yesterday, they were wonder if she needed to be seen by a neurologist.  I certainly would not have any problems with this.  I am just glad that she seems to be getting a little bit better with her train of thinking.  I know we still have a little bit to go.  I know she is eating which is nice to see.  I appreciate the great care that she is getting from the wonderful staff up on [address].  [name], MD  Psalm 139:14

## 2021-01-18 NOTE — Progress Notes (Signed)
Pt very confused this evening.  Coming on shift, 1900, Pt was attempting to walk off the unit.  Nursing staff were able to redirect Pt back to the assigned room.  Pt speech is clear.  She is very convincing about her situation and reason for leaving.  Pt is only oriented to self.  I did speak with son [place]i who attempted to reorient Pt.  On call provider notified.  IM haldol given 1842, and night shift will give scheduled medications early per on call provider in an attempt to help Pt agitation.

## 2021-01-18 NOTE — Progress Notes (Signed)
PROGRESS NOTE    Ariel [name]  VQM:[id number] DOB: 1955-08-25 DOA: 01/14/2021 PCP: [name], NP    Brief Narrative:  65 year old female with history of metastatic B-cell lymphoma with brain mets who presented from home due to progressively worsening altered mental status.  Patient was started on Decadron 12 weeks ago and has been having decline of her mentation.  She is currently on Decadron 5 mg twice daily.  For the past 24 hours she has had severe behavioral changes and aggressive behavior at home.  She was brought into the ED for further evaluation.  Upon presentation to the ED she was agitated and received a dose of Zyprexa with improvement.  EDP contacted Dr. Marin Olp, her medical oncologist, who recommended admission and observe for 48 hours.  When she was evaluated, her mental status was better, but she seemed to be unable to focus.  She denied headache, chest pain or abdominal pain but was complaining of lower back pain.  Denied rhinorrhea, sore throat, productive cough, wheezing or hemoptysis.  No lower extremity edema.  No dysuria, frequency or hematuria.  No polyuria, polydipsia, polyphagia or blurred vision.   Assessment & Plan:   Principal Problem:   AMS (altered mental status) Active Problems:   Hyperlipidemia   Iron deficiency anemia   Hyponatremia   Brain metastasis (HCC)   Acute metabolic encephalopathy   High grade B-cell lymphoma (HCC)   Neutropenia (HCC)  Principal Problem:   Acute toxic metabolic encephalopathy Suspect related to steroid use prior to admit Continuing to taper glucocorticoids as tolerated Oncology agrees with current plan Continued on Zyprexa 5 mg at bedtime. Oriented x 3 but is very conversant.  Cont to avoid sedating meds if possible Family is unable to care for patient. Will not have adequate 24hr supervision, thus SNF is recommended. TOC following   Active Problems:   Hyponatremia Pt had been continued on normal saline  infusion. urine osmolality pending urine sodium pending Sodium improved stable at 133 Repeat bmet in AM     Brain metastasis (HCC) On decadron 4mg  bid prior to visit, weaned to 4mg  daily Oncology following No focal neurologic changes. Pt seen ambulating in hall with therapy CT head with findings of decreased density of lesions in L temporal and parietal lobes which may reflect evolving post-treatment change F/u MRI brain ordered and performed, reviewed with findings of decreased area of hyperintense T2 weighted signal in the L temporal lobe and post L parietal lobe MRI revealing CNS lymphoma areas improving Oncology cont to follow     High grade B-cell lymphoma (HCC)   Neutropenia (HCC) Neutropenic precautions. Currently on acyclovir and PO cipro post-chemo WBC tup to 25.8k, likely from neulasta Appreciate input by Oncology     Iron deficiency anemia Monitor hematocrit and hemoglobin. Hgb stable at 10.2 Iron supplementation. CBC trends reviewed, stable     Hyperlipidemia Currently not on medical therapy.   DVT prophylaxis: SCD's Code Status: Full Family Communication: Pt in room, family currently at bedside  Status is: Inpatient  Remains inpatient appropriate because: Severity of illness   Consultants:  Oncology  Procedures:    Antimicrobials: Anti-infectives (From admission, onward)    Start     Dose/Rate Route Frequency Ordered Stop   01/15/21 1000  acyclovir (ZOVIRAX) 200 MG capsule 200 mg        200 mg Oral Daily 01/14/21 1747     01/15/21 0800  ciprofloxacin (CIPRO) tablet 500 mg  Status:  Discontinued  Note to Pharmacy: Start taking day AFTER chemo finishes for 15 days!!  You will do this with each cycle of chemotherapy!! Per Dr Jonette Eva     500 mg Oral Daily with breakfast 01/14/21 1741 01/17/21 0724   01/14/21 2000  fluconazole (DIFLUCAN) tablet 100 mg        100 mg Oral Daily 01/14/21 1741     01/14/21 1830  famciclovir (FAMVIR) tablet 125 mg   Status:  Discontinued        125 mg Oral Daily 01/14/21 1741 01/14/21 1746       Subjective: Without complaints this AM  Objective: Vitals:   01/17/21 1418 01/17/21 2113 01/18/21 0505 01/18/21 1254  BP: 130/69 139/62 (!) 150/78 (!) 141/63  Pulse: 96 93 94 95  Resp: 20 18 20 18   Temp: 99.2 F (37.3 C) 99.4 F (37.4 C) 98.7 F (37.1 C) 98.5 F (36.9 C)  TempSrc: Oral Oral Oral Oral  SpO2: 98% 97% 97% 98%    Intake/Output Summary (Last 24 hours) at 01/18/2021 1428 Last data filed at 01/18/2021 0700 Gross per 24 hour  Intake 3432.78 ml  Output --  Net 3432.78 ml    There were no vitals filed for this visit.  Examination: General exam: Awake, laying in bed, in nad Respiratory system: Normal respiratory effort, no wheezing Cardiovascular system: regular rate, s1, s2 Gastrointestinal system: Soft, nondistended, positive BS Central nervous system: CN2-12 grossly intact, strength intact Extremities: Perfused, no clubbing Skin: Normal skin turgor, no notable skin lesions seen Psychiatry: Mood normal // no visual hallucinations   Data Reviewed: I have personally reviewed following labs and imaging studies  CBC: Recent Labs  Lab 01/14/21 1148 01/15/21 0521 01/16/21 0437 01/17/21 0230 01/18/21 0435  WBC 1.3* 1.7* 7.0 19.0* 25.8*  NEUTROABS 0.3* 0.5* 4.5 13.9* 16.0*  HGB 11.1* 10.5* 9.9* 10.5* 10.2*  HCT 33.9* 32.4* 31.4* 32.8* 32.1*  MCV 75.3* 75.7* 77.5* 78.3* 77.9*  PLT 142* 120* 112* 113* 107*    Basic Metabolic Panel: Recent Labs  Lab 01/13/21 1029 01/14/21 1148 01/15/21 0521 01/16/21 0437 01/17/21 0230  NA 130* 127* 130* 133* 133*  K 4.0 3.5 3.4* 3.8 3.8  CL 93* 92* 96* 102 101  CO2 32 27 28 26 23   GLUCOSE 157* 122* 93 88 165*  BUN 14 15 8 9 10   CREATININE 0.48 0.44 0.34* 0.34* 0.37*  CALCIUM 9.6 9.1 8.4* 8.6* 8.6*    GFR: CrCl cannot be calculated (Unknown ideal weight.). Liver Function Tests: Recent Labs  Lab 01/13/21 1029 01/14/21 1148  01/15/21 0521 01/16/21 0437 01/17/21 0230  AST 18 26 24 26 31   ALT 56* 55* 59* 67* 69*  ALKPHOS 72 72 77 121 196*  BILITOT 0.4 1.0 0.7 0.8 0.9  PROT 6.6 6.3* 5.7* 5.5* 6.0*  ALBUMIN 3.5 3.1* 2.7* 2.5* 2.8*    No results for input(s): LIPASE, AMYLASE in the last 168 hours. Recent Labs  Lab 01/14/21 1203 01/15/21 0521  AMMONIA 37* 32    Coagulation Profile: No results for input(s): INR, PROTIME in the last 168 hours. Cardiac Enzymes: No results for input(s): CKTOTAL, CKMB, CKMBINDEX, TROPONINI in the last 168 hours. BNP (last 3 results) No results for input(s): PROBNP in the last 8760 hours. HbA1C: No results for input(s): HGBA1C in the last 72 hours. CBG: Recent Labs  Lab 01/15/21 0719  GLUCAP 78    Lipid Profile: No results for input(s): CHOL, HDL, LDLCALC, TRIG, CHOLHDL, LDLDIRECT in the last 72 hours. Thyroid Function  Tests: No results for input(s): TSH, T4TOTAL, FREET4, T3FREE, THYROIDAB in the last 72 hours. Anemia Panel: No results for input(s): VITAMINB12, FOLATE, FERRITIN, TIBC, IRON, RETICCTPCT in the last 72 hours. Sepsis Labs: Recent Labs  Lab 01/14/21 1148  LATICACIDVEN 2.3*     Recent Results (from the past 240 hour(s))  Resp Panel by RT-PCR (Flu A&B, Covid) Nasopharyngeal Swab     Status: None   Collection Time: 01/14/21  2:09 PM   Specimen: Nasopharyngeal Swab; Nasopharyngeal(NP) swabs in vial transport medium  Result Value Ref Range Status   SARS Coronavirus 2 by RT PCR NEGATIVE NEGATIVE Final    Comment: (NOTE) SARS-CoV-2 target nucleic acids are NOT DETECTED.  The SARS-CoV-2 RNA is generally detectable in upper respiratory specimens during the acute phase of infection. The lowest concentration of SARS-CoV-2 viral copies this assay can detect is 138 copies/mL. A negative result does not preclude SARS-Cov-2 infection and should not be used as the sole basis for treatment or other patient management decisions. A negative result may occur  with  improper specimen collection/handling, submission of specimen other than nasopharyngeal swab, presence of viral mutation(s) within the areas targeted by this assay, and inadequate number of viral copies(<138 copies/mL). A negative result must be combined with clinical observations, patient history, and epidemiological information. The expected result is Negative.  Fact Sheet for Patients:  EntrepreneurPulse.com.au  Fact Sheet for Healthcare Providers:  IncredibleEmployment.be  This test is no t yet approved or cleared by the [place] FDA and  has been authorized for detection and/or diagnosis of SARS-CoV-2 by FDA under an Emergency Use Authorization (EUA). This EUA will remain  in effect (meaning this test can be used) for the duration of the COVID-19 declaration under Section 564(b)(1) of the Act, 21 U.S.C.section 360bbb-3(b)(1), unless the authorization is terminated  or revoked sooner.       Influenza A by PCR NEGATIVE NEGATIVE Final   Influenza B by PCR NEGATIVE NEGATIVE Final    Comment: (NOTE) The Xpert Xpress SARS-CoV-2/FLU/RSV plus assay is intended as an aid in the diagnosis of influenza from Nasopharyngeal swab specimens and should not be used as a sole basis for treatment. Nasal washings and aspirates are unacceptable for Xpert Xpress SARS-CoV-2/FLU/RSV testing.  Fact Sheet for Patients: EntrepreneurPulse.com.au  Fact Sheet for Healthcare Providers: IncredibleEmployment.be  This test is not yet approved or cleared by the [place] FDA and has been authorized for detection and/or diagnosis of SARS-CoV-2 by FDA under an Emergency Use Authorization (EUA). This EUA will remain in effect (meaning this test can be used) for the duration of the COVID-19 declaration under Section 564(b)(1) of the Act, 21 U.S.C. section 360bbb-3(b)(1), unless the authorization is terminated  or revoked.  Performed at [facility], [address], [place], [place] [address]       Radiology Studies: No results found.  Scheduled Meds:  acyclovir  200 mg Oral Daily   Chlorhexidine Gluconate Cloth  6 each Topical Daily   dexamethasone  2 mg Oral Daily   enoxaparin (LOVENOX) injection  40 mg Subcutaneous Q24H   fluconazole  100 mg Oral Daily   levETIRAcetam  500 mg Oral BID   OLANZapine zydis  5 mg Oral QHS   Continuous Infusions:  0.9 % NaCl with KCl 20 mEq / L 100 mL/hr at 01/18/21 0650     LOS: 4 days   Marylu Lund, MD [facility] Pager On Amion  If 7PM-7AM, please contact night-coverage 01/18/2021, 2:28 PM

## 2021-01-18 NOTE — NC FL2 (Signed)
[place] MEDICAID FL2 LEVEL OF CARE SCREENING TOOL     IDENTIFICATION  Patient Name: Ariel [name] Birthdate: 1955/04/18 Sex: female Admission Date (Current Location): 01/14/2021  [facility] and [place] Number:  Herbalist and Address:  [facility],  [place], [place]      Provider Number: [id number]  Attending Physician Name and Address:  Donne Hazel, MD  Relative Name and Phone Number:  [place] Daughter [phone]    Current Level of Care: Hospital Recommended Level of Care: [place] Prior Approval Number:    Date Approved/Denied:   PASRR Number: [id number]  Discharge Plan: SNF    Current Diagnoses: Patient Active Problem List   Diagnosis Date Noted   AMS (altered mental status) 01/14/2021   Neutropenia ([place]) 01/14/2021   High grade B-cell lymphoma ([place]) 12/22/2020   Goals of care, counseling/discussion 34/19/3790   Acute metabolic encephalopathy 24/10/7351   Brain metastasis ([place]) 12/09/2020   Brain tumor ([place]) 12/08/2020   Hyponatremia 12/08/2020   Night sweat 11/30/2020   Chicken pox 29/92/4268   Complication of anesthesia 11/22/2020   PONV (postoperative nausea and vomiting) 11/22/2020   Skin rash 11/10/2020   Tachycardia 11/10/2020   Anemia 11/10/2020   Hyperglycemia 11/10/2020   TIA (transient ischemic attack) 10/07/2020   Iron deficiency anemia 10/07/2020   Aphasia 10/06/2020   Melanoma ([place]) 2021   Multinodular goiter 12/22/2013   Subclinical hyperthyroidism 12/19/2013   Urinary incontinence 11/19/2013   Hyperlipidemia 11/19/2012   Routine general medical examination at a health care facility 11/18/2012    Orientation RESPIRATION BLADDER Height & Weight     Place, Self  Normal Continent Weight:   Height:     BEHAVIORAL SYMPTOMS/MOOD NEUROLOGICAL BOWEL NUTRITION STATUS      Continent Diet (see d/c summary)  AMBULATORY STATUS COMMUNICATION OF NEEDS Skin   Extensive Assist  Verbally Normal                       Personal Care Assistance Level of Assistance  Bathing, Feeding, Dressing Bathing Assistance: Limited assistance Feeding assistance: Limited assistance Dressing Assistance: Limited assistance     Functional Limitations Info  Sight, Hearing, Speech Sight Info: Adequate Hearing Info: Adequate Speech Info: Adequate    SPECIAL CARE FACTORS FREQUENCY  PT (By licensed PT), OT (By licensed OT)     PT Frequency: 5X/W OT Frequency: 5X/W            Contractures Contractures Info: Not present    Additional Factors Info  Code Status, Allergies Code Status Info: full Allergies Info: codeine, Plavix           Current Medications (01/18/2021):  This is the current hospital active medication list Current Facility-Administered Medications  Medication Dose Route Frequency Provider Last Rate Last Admin   0.9 % NaCl with KCl 20 mEq/ L  infusion   Intravenous Continuous Reubin Milan, MD 100 mL/hr at 01/18/21 0650 Restarted at 01/18/21 0650   acetaminophen (TYLENOL) tablet 650 mg  650 mg Oral Q6H PRN Reubin Milan, MD   650 mg at 01/16/21 2215   Or   acetaminophen (TYLENOL) suppository 650 mg  650 mg Rectal Q6H PRN Reubin Milan, MD       acyclovir (ZOVIRAX) 200 MG capsule 200 mg  200 mg Oral Daily Reubin Milan, MD   200 mg at 01/17/21 1140   Chlorhexidine Gluconate Cloth 2 % PADS 6 each  6 each Topical  Daily Reubin Milan, MD   6 each at 01/17/21 1325   dexamethasone (DECADRON) tablet 2 mg  2 mg Oral Daily Volanda Napoleon, MD   2 mg at 01/17/21 1139   enoxaparin (LOVENOX) injection 40 mg  40 mg Subcutaneous Q24H Donne Hazel, MD   40 mg at 01/17/21 1324   fluconazole (DIFLUCAN) tablet 100 mg  100 mg Oral Daily Reubin Milan, MD   100 mg at 01/17/21 1140   levETIRAcetam (KEPPRA) tablet 500 mg  500 mg Oral BID Reubin Milan, MD   500 mg at 01/17/21 2206   lip balm (CARMEX) ointment   Topical PRN  Donne Hazel, MD       OLANZapine zydis [facility]) disintegrating tablet 5 mg  5 mg Oral QHS Harris, Vernie Shanks, PA-C   5 mg at 01/17/21 2207   ondansetron (ZOFRAN) tablet 4 mg  4 mg Oral Q6H PRN Reubin Milan, MD       Or   ondansetron [facility]) injection 4 mg  4 mg Intravenous Q6H PRN Reubin Milan, MD       sodium chloride flush (NS) 0.9 % injection 10-40 mL  10-40 mL Intracatheter PRN Donne Hazel, MD       temazepam (RESTORIL) capsule 15 mg  15 mg Oral QHS PRN Reubin Milan, MD   15 mg at 01/16/21 2215   Facility-Administered Medications Ordered in Other Encounters  Medication Dose Route Frequency Provider Last Rate Last Admin   0.9 %  sodium chloride infusion   Intravenous Once Volanda Napoleon, MD         Discharge Medications: Please see discharge summary for a list of discharge medications.  Relevant Imaging Results:  Relevant Lab Results:   Additional Information [place], [place]

## 2021-01-18 NOTE — Progress Notes (Signed)
Physical Therapy Treatment Patient Details Name: Ariel [name] MRN: [id number] DOB: June 13, 1955 Today's Date: 01/18/2021   History of Present Illness Ariel [name] is a 65 y.o. female with medical history significant of HLD, IDA, TIA in 09/2020, melanoma of the right hip 2021 stage IB and vitreous hemorrhage of the right eye s/p vitrectomy 12/07/20 diagnosed 12/15/20 with metastatic B-cell lymphoma including metastatic brain disease to L temporal and parietal lobes.  Pt admitted from home 01/14/21 with AMS.    PT Comments    Patient making good progress with mobility required min guard/assist for transfers and gait with IV pole for support. Patient continues to required moderate cuing for safety with mobility to navigate environment and reduce fall risk. Initiated functional LE strengthening this session and provided handout for pt to review. At this time pt's family cannot provide 24/7 assist and pt remains fall risk due to decreased safety awareness and balance impairments. Recommend ST rehab at SNF level to improve safety and strength at discharge. Acute PT will continue to progress pt as able.    Recommendations for follow up therapy are one component of a multi-disciplinary discharge planning process, led by the attending physician.  Recommendations may be updated based on patient status, additional functional criteria and insurance authorization.  Follow Up Recommendations  Skilled nursing-short term rehab (<3 hours/day) (family can no longer provide 24/7)     Assistance Recommended at Discharge Frequent or constant Supervision/Assistance  Equipment Recommendations  None recommended by PT    Recommendations for Other Services       Precautions / Restrictions Precautions Precautions: Fall Restrictions Weight Bearing Restrictions: No     Mobility  Bed Mobility               General bed mobility comments: Pt up to bathroom and then recliner at start of session. EOS return to  chair.    Transfers Overall transfer level: Needs assistance Equipment used: None Transfers: Sit to/from Stand Sit to Stand: Min assist;Min guard           General transfer comment: guarding for safety to rise with use of BIl UE's for power up. repeated stand with no UE use and min assist required to complete rise.    Ambulation/Gait Ambulation/Gait assistance: Min guard Gait Distance (Feet): 400 Feet Assistive device: IV Pole Gait Pattern/deviations: Step-through pattern;Decreased step length - right;Decreased step length - left;Shuffle;Drifts right/left Gait velocity: cues for use of IV pole and min guard for safety throughout.  pt with slight drift but no overt LOB noted. Gait velocity interpretation: 1.31 - 2.62 ft/sec, indicative of limited community ambulator   General Gait Details: Pt requiring min assist for balance sans AD but min quard with use of IV pole or RW   Stairs             Wheelchair Mobility    Modified Rankin (Stroke Patients Only)       Balance Overall balance assessment: Needs assistance Sitting-balance support: No upper extremity supported;Feet supported Sitting balance-Leahy Scale: Good     Standing balance support: Single extremity supported;No upper extremity supported;During functional activity Standing balance-Leahy Scale: Fair                              Cognition Arousal/Alertness: Awake/alert Behavior During Therapy: Impulsive Overall Cognitive Status: History of cognitive impairments - at baseline  General Comments: Pt with very tangential thinking and conversation; requires cues to focus on task at home and for safety with negotiating obstacles in environment.        Exercises Other Exercises Other Exercises: 10 reps Sit<>Stand from recliner, no UE use for power  up: min assist to complete rise Other Exercises: Standing with counter support: 20 rep sbil LE heel  raises; 10 reps bil hip abduction; 10 reps bil LE marching    General Comments        Pertinent Vitals/Pain      Home Living                          Prior Function            PT Goals (current goals can now be found in the care plan section) Acute Rehab PT Goals Patient Stated Goal: Regain IND PT Goal Formulation: With patient Time For Goal Achievement: 01/30/21 Potential to Achieve Goals: Fair Progress towards PT goals: Progressing toward goals    Frequency    Min 3X/week      PT Plan Current plan remains appropriate    Co-evaluation              AM-PAC PT "6 Clicks" Mobility   Outcome Measure  Help needed turning from your back to your side while in a flat bed without using bedrails?: None Help needed moving from lying on your back to sitting on the side of a flat bed without using bedrails?: A Little Help needed moving to and from a bed to a chair (including a wheelchair)?: A Little Help needed standing up from a chair using your arms (e.g., wheelchair or bedside chair)?: A Little Help needed to walk in hospital room?: A Little Help needed climbing 3-5 steps with a railing? : A Little 6 Click Score: 19    End of Session Equipment Utilized During Treatment: Gait belt Activity Tolerance: Patient tolerated treatment well Patient left: in chair;with call bell/phone within reach;with chair alarm set Nurse Communication: Mobility status PT Visit Diagnosis: Difficulty in walking, not elsewhere classified (R26.2);Unsteadiness on feet (R26.81);Muscle weakness (generalized) (M62.81)     Time: 6195-0932 PT Time Calculation (min) (ACUTE ONLY): 28 min  Charges:  $Gait Training: 8-22 mins $Therapeutic Exercise: 8-22 mins                     Verner Mould, DPT Acute Rehabilitation Services Office [phone] Pager [phone]    Jacques Navy 01/18/2021, 11:12 AM

## 2021-01-19 ENCOUNTER — Ambulatory Visit: Payer: Medicare Other

## 2021-01-19 ENCOUNTER — Inpatient Hospital Stay: Payer: Medicare Other

## 2021-01-19 DIAGNOSIS — R401 Stupor: Secondary | ICD-10-CM

## 2021-01-19 LAB — COMPREHENSIVE METABOLIC PANEL
ALT: 52 U/L — ABNORMAL HIGH (ref 0–44)
AST: 31 U/L (ref 15–41)
Albumin: 2.9 g/dL — ABNORMAL LOW (ref 3.5–5.0)
Alkaline Phosphatase: 207 U/L — ABNORMAL HIGH (ref 38–126)
Anion gap: 5 (ref 5–15)
BUN: 11 mg/dL (ref 8–23)
CO2: 27 mmol/L (ref 22–32)
Calcium: 8.9 mg/dL (ref 8.9–10.3)
Chloride: 101 mmol/L (ref 98–111)
Creatinine, Ser: 0.35 mg/dL — ABNORMAL LOW (ref 0.44–1.00)
GFR, Estimated: >60 mL/min (ref >60)
Glucose, Bld: 106 mg/dL — ABNORMAL HIGH (ref 70–99)
Potassium: 4 mmol/L (ref 3.5–5.1)
Sodium: 133 mmol/L — ABNORMAL LOW (ref 135–145)
Total Bilirubin: 0.3 mg/dL (ref 0.3–1.2)
Total Protein: 5.8 g/dL — ABNORMAL LOW (ref 6.5–8.1)

## 2021-01-19 LAB — CBC WITH DIFFERENTIAL/PLATELET
Abs Immature Granulocytes: 3.1 10*3/uL — ABNORMAL HIGH (ref 0.00–0.07)
Band Neutrophils: 3 %
Basophils Absolute: 0 10*3/uL (ref 0.0–0.1)
Basophils Relative: 0 %
Eosinophils Absolute: 0 10*3/uL (ref 0.0–0.5)
Eosinophils Relative: 0 %
HCT: 31.6 % — ABNORMAL LOW (ref 36.0–46.0)
Hemoglobin: 10 g/dL — ABNORMAL LOW (ref 12.0–15.0)
Lymphocytes Relative: 8 %
Lymphs Abs: 2.2 10*3/uL (ref 0.7–4.0)
MCH: 25.1 pg — ABNORMAL LOW (ref 26.0–34.0)
MCHC: 31.6 g/dL (ref 30.0–36.0)
MCV: 79.2 fL — ABNORMAL LOW (ref 80.0–100.0)
Metamyelocytes Relative: 3 %
Monocytes Absolute: 0.8 10*3/uL (ref 0.1–1.0)
Monocytes Relative: 3 %
Myelocytes: 8 %
Neutro Abs: 21.9 10*3/uL — ABNORMAL HIGH (ref 1.7–7.7)
Neutrophils Relative %: 75 %
Platelets: 111 10*3/uL — ABNORMAL LOW (ref 150–400)
RBC: 3.99 MIL/uL (ref 3.87–5.11)
RDW: 19.9 % — ABNORMAL HIGH (ref 11.5–15.5)
WBC: 28.1 10*3/uL — ABNORMAL HIGH (ref 4.0–10.5)
nRBC: 0.2 % (ref 0.0–0.2)

## 2021-01-19 MED ORDER — LIP MEDEX EX OINT
TOPICAL_OINTMENT | CUTANEOUS | Status: DC | PRN
  Administered 2021-01-19: 75 via TOPICAL
  Filled 2021-01-19 (×2): qty 7

## 2021-01-19 MED ORDER — POTASSIUM CHLORIDE IN NACL 20-0.9 MEQ/L-% IV SOLN
INTRAVENOUS | Status: DC
  Filled 2021-01-19 (×2): qty 1000

## 2021-01-19 NOTE — TOC Progression Note (Signed)
Transition of Care [facility]) - Progression Note    Patient Details  Name: Ariel [name] MRN: [id number] Date of Birth: 08-Mar-1955  Transition of Care [facility] CM/SW [place], [place] Phone Number: 01/19/2021, 11:18 AM  Clinical Narrative:   No bed offers, none anticipated due to current mentation, severity of illness. Unclear on plan going forward. TOC will continue to follow during the course of hospitalization.     Expected Discharge Plan:  (to be determined) Barriers to Discharge: Continued Medical Work up  Expected Discharge Plan and Services Expected Discharge Plan:  (to be determined)   Discharge Planning Services: CM Consult Post Acute Care Choice: [place] arrangements for the past 2 months: Single Family Home                                       Social Determinants of Health (SDOH) Interventions    Readmission Risk Interventions No flowsheet data found.

## 2021-01-19 NOTE — Progress Notes (Signed)
PROGRESS NOTE    Ariel [name]  CVE:[id number] DOB: September 23, 1955 DOA: 01/14/2021 PCP: [name], NP    Brief Narrative:  65 year old female with history of metastatic B-cell lymphoma with brain mets who presented from home due to progressively worsening altered mental status.  Patient was started on Decadron 12 weeks ago and has been having decline of her mentation.  She is currently on Decadron 5 mg twice daily.  For the past 24 hours she has had severe behavioral changes and aggressive behavior at home.  She was brought into the ED for further evaluation.  Upon presentation to the ED she was agitated and received a dose of Zyprexa with improvement.  EDP contacted Dr. Marin Olp, her medical oncologist, who recommended admission and observe for 48 hours.  When she was evaluated, her mental status was better, but she seemed to be unable to focus.  She denied headache, chest pain or abdominal pain but was complaining of lower back pain.  Denied rhinorrhea, sore throat, productive cough, wheezing or hemoptysis.  No lower extremity edema.  No dysuria, frequency or hematuria.  No polyuria, polydipsia, polyphagia or blurred vision.  01/19/2021: Patient was seen and examined at her bedside.  She states she feels better today.  He is alert and interactive.   Assessment & Plan:   Principal Problem:   AMS (altered mental status) Active Problems:   Hyperlipidemia   Iron deficiency anemia   Hyponatremia   Brain metastasis (HCC)   Acute metabolic encephalopathy   High grade B-cell lymphoma (HCC)   Neutropenia (HCC)  Principal Problem: Acute toxic metabolic encephalopathy Suspect related to steroid use prior to admit Continuing to taper glucocorticoids as tolerated Oncology agrees with current plan Continue on Zyprexa 5 mg at bedtime. Oriented x 3 but is very conversant.  Cont to avoid sedating meds if possible Family is unable to care for patient. Will not have adequate 24hr supervision,  thus SNF is recommended. TOC following   Active Problems:   Hyponatremia, improving Pt had been continued on normal saline infusion. Sodium improved stable at 133 Continue to monitor     Brain metastasis (HCC) On decadron 4mg  bid prior to visit, weaned to 4mg  daily Oncology following No focal neurologic changes. Pt seen ambulating in [facility] with therapy CT head with findings of decreased density of lesions in L temporal and parietal lobes which may reflect evolving post-treatment change F/u MRI brain ordered and performed, reviewed with findings of decreased area of hyperintense T2 weighted signal in the L temporal lobe and post L parietal lobe MRI revealing CNS lymphoma areas improving Oncology cont to follow     High grade B-cell lymphoma (HCC)   Neutropenia (HCC) Neutropenic precautions. Currently on acyclovir and PO cipro post-chemo WBC tup to 25.8k, likely from neulasta Appreciate input by Oncology     Iron deficiency anemia Monitor hematocrit and hemoglobin. Hgb stable at 10.2 Iron supplementation. CBC trends reviewed, stable     Hyperlipidemia Currently not on medical therapy.   DVT prophylaxis: SCD's Code Status: Full Family Communication: None at bedside.  Status is: Inpatient  Remains inpatient appropriate because: Severity of illness   Consultants:  Oncology  Procedures:    Antimicrobials: Anti-infectives (From admission, onward)    Start     Dose/Rate Route Frequency Ordered Stop   01/15/21 1000  acyclovir (ZOVIRAX) 200 MG capsule 200 mg        200 mg Oral Daily 01/14/21 1747     01/15/21 0800  ciprofloxacin (CIPRO) tablet  500 mg  Status:  Discontinued       Note to Pharmacy: Start taking day AFTER chemo finishes for 15 days!!  You will do this with each cycle of chemotherapy!! Per Dr Jonette Eva     500 mg Oral Daily with breakfast 01/14/21 1741 01/17/21 0724   01/14/21 2000  fluconazole (DIFLUCAN) tablet 100 mg        100 mg Oral Daily 01/14/21 1741      01/14/21 1830  famciclovir (FAMVIR) tablet 125 mg  Status:  Discontinued        125 mg Oral Daily 01/14/21 1741 01/14/21 1746       Subjective: Without complaints this AM  Objective: Vitals:   01/18/21 1254 01/18/21 2045 01/19/21 0418 01/19/21 1335  BP: (!) 141/63 133/72 (!) 127/58 (!) 153/63  Pulse: 95 70 86 70  Resp: 18 16 18 19   Temp: 98.5 F (36.9 C) (!) 97.5 F (36.4 C) 97.7 F (36.5 C) 97.9 F (36.6 C)  TempSrc: Oral Axillary Oral Oral  SpO2: 98% 97% 100% 97%    Intake/Output Summary (Last 24 hours) at 01/19/2021 1635 Last data filed at 01/19/2021 1500 Gross per 24 hour  Intake 400.43 ml  Output --  Net 400.43 ml   There were no vitals filed for this visit.  Examination: General exam: Well-developed well-nourished no acute distress.  She is alert and interactive.   Respiratory system: Clear to auscultation no wheezes or rales.   Cardiovascular system: Regular rate and rhythm no rubs or gallops.   Gastrointestinal system: Soft nontender bowel sounds present.  Central nervous system: CN2-12 grossly intact, strength intact Extremities: Trace lower extremity edema in lower extremities bilaterally.  Skin: Normal skin turgor, no notable skin lesions seen Psychiatry: Mood is appropriate for condition and setting.  Data Reviewed: I have personally reviewed following labs and imaging studies  CBC: Recent Labs  Lab 01/15/21 0521 01/16/21 0437 01/17/21 0230 01/18/21 0435 01/19/21 0432  WBC 1.7* 7.0 19.0* 25.8* 28.1*  NEUTROABS 0.5* 4.5 13.9* 16.0* 21.9*  HGB 10.5* 9.9* 10.5* 10.2* 10.0*  HCT 32.4* 31.4* 32.8* 32.1* 31.6*  MCV 75.7* 77.5* 78.3* 77.9* 79.2*  PLT 120* 112* 113* 107* 048*   Basic Metabolic Panel: Recent Labs  Lab 01/14/21 1148 01/15/21 0521 01/16/21 0437 01/17/21 0230 01/19/21 0432  NA 127* 130* 133* 133* 133*  K 3.5 3.4* 3.8 3.8 4.0  CL 92* 96* 102 101 101  CO2 27 28 26 23 27   GLUCOSE 122* 93 88 165* 106*  BUN 15 8 9 10 11    CREATININE 0.44 0.34* 0.34* 0.37* 0.35*  CALCIUM 9.1 8.4* 8.6* 8.6* 8.9   GFR: CrCl cannot be calculated (Unknown ideal weight.). Liver Function Tests: Recent Labs  Lab 01/14/21 1148 01/15/21 0521 01/16/21 0437 01/17/21 0230 01/19/21 0432  AST 26 24 26 31 31   ALT 55* 59* 67* 69* 52*  ALKPHOS 72 77 121 196* 207*  BILITOT 1.0 0.7 0.8 0.9 0.3  PROT 6.3* 5.7* 5.5* 6.0* 5.8*  ALBUMIN 3.1* 2.7* 2.5* 2.8* 2.9*   No results for input(s): LIPASE, AMYLASE in the last 168 hours. Recent Labs  Lab 01/14/21 1203 01/15/21 0521  AMMONIA 37* 32   Coagulation Profile: No results for input(s): INR, PROTIME in the last 168 hours. Cardiac Enzymes: No results for input(s): CKTOTAL, CKMB, CKMBINDEX, TROPONINI in the last 168 hours. BNP (last 3 results) No results for input(s): PROBNP in the last 8760 hours. HbA1C: No results for input(s): HGBA1C in the last  72 hours. CBG: Recent Labs  Lab 01/15/21 0719  GLUCAP 78   Lipid Profile: No results for input(s): CHOL, HDL, LDLCALC, TRIG, CHOLHDL, LDLDIRECT in the last 72 hours. Thyroid Function Tests: No results for input(s): TSH, T4TOTAL, FREET4, T3FREE, THYROIDAB in the last 72 hours. Anemia Panel: No results for input(s): VITAMINB12, FOLATE, FERRITIN, TIBC, IRON, RETICCTPCT in the last 72 hours. Sepsis Labs: Recent Labs  Lab 01/14/21 1148  LATICACIDVEN 2.3*    Recent Results (from the past 240 hour(s))  Resp Panel by RT-PCR (Flu A&B, Covid) Nasopharyngeal Swab     Status: None   Collection Time: 01/14/21  2:09 PM   Specimen: Nasopharyngeal Swab; Nasopharyngeal(NP) swabs in vial transport medium  Result Value Ref Range Status   SARS Coronavirus 2 by RT PCR NEGATIVE NEGATIVE Final    Comment: (NOTE) SARS-CoV-2 target nucleic acids are NOT DETECTED.  The SARS-CoV-2 RNA is generally detectable in upper respiratory specimens during the acute phase of infection. The lowest concentration of SARS-CoV-2 viral copies this assay can  detect is 138 copies/mL. A negative result does not preclude SARS-Cov-2 infection and should not be used as the sole basis for treatment or other patient management decisions. A negative result may occur with  improper specimen collection/handling, submission of specimen other than nasopharyngeal swab, presence of viral mutation(s) within the areas targeted by this assay, and inadequate number of viral copies(<138 copies/mL). A negative result must be combined with clinical observations, patient history, and epidemiological information. The expected result is Negative.  Fact Sheet for Patients:  EntrepreneurPulse.com.au  Fact Sheet for Healthcare Providers:  IncredibleEmployment.be  This test is no t yet approved or cleared by the [place] FDA and  has been authorized for detection and/or diagnosis of SARS-CoV-2 by FDA under an Emergency Use Authorization (EUA). This EUA will remain  in effect (meaning this test can be used) for the duration of the COVID-19 declaration under Section 564(b)(1) of the Act, 21 U.S.C.section 360bbb-3(b)(1), unless the authorization is terminated  or revoked sooner.       Influenza A by PCR NEGATIVE NEGATIVE Final   Influenza B by PCR NEGATIVE NEGATIVE Final    Comment: (NOTE) The Xpert Xpress SARS-CoV-2/FLU/RSV plus assay is intended as an aid in the diagnosis of influenza from Nasopharyngeal swab specimens and should not be used as a sole basis for treatment. Nasal washings and aspirates are unacceptable for Xpert Xpress SARS-CoV-2/FLU/RSV testing.  Fact Sheet for Patients: EntrepreneurPulse.com.au  Fact Sheet for Healthcare Providers: IncredibleEmployment.be  This test is not yet approved or cleared by the [place] FDA and has been authorized for detection and/or diagnosis of SARS-CoV-2 by FDA under an Emergency Use Authorization (EUA). This EUA will remain in  effect (meaning this test can be used) for the duration of the COVID-19 declaration under Section 564(b)(1) of the Act, 21 U.S.C. section 360bbb-3(b)(1), unless the authorization is terminated or revoked.  Performed at [facility], [address] [place],  [address]       Radiology Studies: No results found.  Scheduled Meds:  acyclovir  200 mg Oral Daily   Chlorhexidine Gluconate Cloth  6 each Topical Daily   dexamethasone  2 mg Oral Daily   enoxaparin (LOVENOX) injection  40 mg Subcutaneous Q24H   fluconazole  100 mg Oral Daily   levETIRAcetam  500 mg Oral BID   OLANZapine zydis  5 mg Oral QHS   Continuous Infusions:  0.9 % NaCl with KCl 20 mEq /  L 50 mL/hr at 01/19/21 1300     LOS: 5 days   Kayleen Memos, MD [facility] Pager On Amion  If 7PM-7AM, please contact night-coverage 01/19/2021, 4:35 PM

## 2021-01-19 NOTE — Progress Notes (Signed)
Unfortunately, Ariel [name] is definitely not as alert or oriented this morning.  She is "all over the place" with her thoughts.  As always as if she has little bit of manic.  I just wonder if she needs to be seen by psychiatry.  I am not sure that these tumors in the brain are causing this.  She denies seem to have this in the past.  I cannot see any medications that she is on that would be causing this.  I know she is on a little bit of Decadron which are trying to taper off.  She is pleasant.  She does not appear to be agitated.  Again is hard to say if she is eating well.  I do not think she is having any problems with nausea or vomiting.  There is no bowel or bladder incontinence.  Her labs show sodium 133.  Potassium 4.0.  BUN 11 creatinine 0.35.  Calcium is 8.9 with an albumin of 2.9.  Her white cell count is 28.1.  Hemoglobin 10.  Platelet count 111,000.  There is no fever.  She has had no bleeding.  Her vital signs show temperature 97.7.  Pulse 86.  Blood pressure 127/58.  Her head neck exam shows no ocular or oral lesions.  She has no adenopathy in the neck.  Lungs are clear.  Cardiac exam regular rate and rhythm.  Abdomen is soft.  Bowel sounds are present.  There is no guarding or rebound tenderness.  Extremity shows no clubbing, cyanosis or edema.  Neurological exam shows no focal neurological deficits.  I just hate the fact that she seems to have regressed with her cognitive state.  Again, she is quite "flighty."  She tells a lot of different stories.  Most of these are from several years ago.  She also was talking about some kind of eye surgery that she had.  I just hate that she is like this.  Again she is very pleasant.  It certainly would be difficult to treat her again given her current state of mind.  Again, I will know if a psychiatrist would be able to help [place] out and see if there is any medication we can try to get her on to try to "slow down" her thought  pattern.  I know the staff on [address] are doing a tremendous job with her.  I know that they are attentive and show a lot of compassion.  [name], MD  Ariel [name] 3:16

## 2021-01-19 NOTE — Plan of Care (Signed)

## 2021-01-20 DIAGNOSIS — R401 Stupor: Secondary | ICD-10-CM | POA: Diagnosis not present

## 2021-01-20 LAB — CBC WITH DIFFERENTIAL/PLATELET
Abs Immature Granulocytes: 6.8 10*3/uL — ABNORMAL HIGH (ref 0.00–0.07)
Basophils Absolute: 0 10*3/uL (ref 0.0–0.1)
Basophils Relative: 0 %
Eosinophils Absolute: 0 10*3/uL (ref 0.0–0.5)
Eosinophils Relative: 0 %
HCT: 30.3 % — ABNORMAL LOW (ref 36.0–46.0)
Hemoglobin: 9.5 g/dL — ABNORMAL LOW (ref 12.0–15.0)
Immature Granulocytes: 24 %
Lymphocytes Relative: 9 %
Lymphs Abs: 2.6 10*3/uL (ref 0.7–4.0)
MCH: 24.8 pg — ABNORMAL LOW (ref 26.0–34.0)
MCHC: 31.4 g/dL (ref 30.0–36.0)
MCV: 79.1 fL — ABNORMAL LOW (ref 80.0–100.0)
Monocytes Absolute: 1.9 10*3/uL — ABNORMAL HIGH (ref 0.1–1.0)
Monocytes Relative: 6 %
Neutro Abs: 17.5 10*3/uL — ABNORMAL HIGH (ref 1.7–7.7)
Neutrophils Relative %: 61 %
Platelets: 125 10*3/uL — ABNORMAL LOW (ref 150–400)
RBC: 3.83 MIL/uL — ABNORMAL LOW (ref 3.87–5.11)
RDW: 19.9 % — ABNORMAL HIGH (ref 11.5–15.5)
WBC: 28.8 10*3/uL — ABNORMAL HIGH (ref 4.0–10.5)
nRBC: 0.6 % — ABNORMAL HIGH (ref 0.0–0.2)

## 2021-01-20 LAB — PHOSPHORUS: Phosphorus: 1.3 mg/dL — ABNORMAL LOW (ref 2.5–4.6)

## 2021-01-20 LAB — MAGNESIUM: Magnesium: 1.9 mg/dL (ref 1.7–2.4)

## 2021-01-20 MED ORDER — TEMAZEPAM 7.5 MG PO CAPS
7.5000 mg | ORAL_CAPSULE | Freq: Every evening | ORAL | Status: DC | PRN
  Administered 2021-01-20 – 2021-01-24 (×5): 7.5 mg via ORAL
  Filled 2021-01-20 (×7): qty 1

## 2021-01-20 NOTE — Progress Notes (Signed)
Thankfully, Ms. Lininger does have a lot more clarity this morning.  We really had a nice conversation.  She was much less "flighty."  She slept well.  I would like to get another MRI of the brain.  The 1 that she had initially really did not have a lot of contrast that went to the brain.  I really need to see what is going on with the CNS lymphoma that has spread.  She is eating well.  There is no nausea or vomiting.  She is walking.  Her son was with his this morning.  Again, she was much more alert.  Her labs show white cell count 28.8.  Hemoglobin 9.5.  Platelet count 125,000.  Her vital signs are temperature 98.1.  Pulse 83.  Blood pressure 155/61.  Her head neck exam shows no mucositis.  She has no adenopathy in the neck.  Lungs are clear.  She has good air movement.  Cardiac exam regular rate and rhythm.  She has no murmurs.  Abdomen is soft.  Neurological exam shows no focal neurological deficits.  I am happy that Ms. Corti has much more clarity this morning.  Actually, if I still see this improvement, we may actually think about treating her next week.  I would have her in the hospital.  The MRI of the brain will clearly be helpful.  Hopefully we can get good contrast up to the brain lesions and see exactly what is going on.  We can see him which swelling is happening.  I do thank the wonderful staff on [facility]  I am so thankful for all that they do for all my patients.   [name], MD  Psalm 9:1

## 2021-01-20 NOTE — Plan of Care (Signed)

## 2021-01-20 NOTE — Progress Notes (Signed)
PROGRESS NOTE    Ariel [name]  ZOX:[id number] DOB: January 28, 1956 DOA: 01/14/2021 PCP: [name], NP    Brief Narrative:  65 year old female with history of metastatic B-cell lymphoma with brain mets who presented from home due to progressively worsening altered mental status.  Patient was started on Decadron 12 weeks ago and has been having decline of her mentation.  She is currently on Decadron 5 mg twice daily.  For the past 24 hours she has had severe behavioral changes and aggressive behavior at home.  She was brought into the ED for further evaluation.  Upon presentation to the ED she was agitated and received a dose of Zyprexa with improvement.  EDP contacted Dr. Marin Olp, her medical oncologist, who recommended admission and observe for 48 hours.  When she was evaluated, her mental status was better, but she seemed to be unable to focus.  She denied headache, chest pain or abdominal pain but was complaining of lower back pain.  Denied rhinorrhea, sore throat, productive cough, wheezing or hemoptysis.  No lower extremity edema.  No dysuria, frequency or hematuria.  No polyuria, polydipsia, polyphagia or blurred vision.  Did concern for CNS lymphoma and transplant, MRI brain with contrast ordered by medical oncology, pending.  01/20/2021: Patient was seen at her bedside.  Her daughter was present in the room.  She is alert and oriented.  She had no new complaints.  The edema in her lower extremities bilaterally are almost completely resolved.    Per medical oncology, plan is to possibly begin treatment for her cancer next week while inpatient.   Assessment & Plan:   Principal Problem:   AMS (altered mental status) Active Problems:   Hyperlipidemia   Iron deficiency anemia   Hyponatremia   Brain metastasis (HCC)   Acute metabolic encephalopathy   High grade B-cell lymphoma (HCC)   Neutropenia (HCC)  Principal Problem: Resolved acute toxic metabolic encephalopathy Suspect  related to steroid use prior to admit Tapering of the steroid per medical oncology. Continue on Zyprexa 5 mg at bedtime. Oriented x 3 but is very conversant.  Cont to avoid sedating meds if possible Family is unable to care for patient. Will not have adequate 24hr supervision, thus SNF is recommended. TOC following   Active Problems:   Hyponatremia, improving Pt had been continued on normal saline infusion. Sodium improved stable at 133 Repeat BMP in the morning     Brain metastasis (HCC) On decadron 4mg  bid prior to visit, weaned to 4mg  daily, then 2 mg daily. No focal neurologic changes. Pt seen ambulating in [facility] with therapy. CT head with findings of decreased density of lesions in L temporal and parietal lobes which may reflect evolving post-treatment change F/u MRI brain ordered and performed, reviewed with findings of decreased area of hyperintense T2 weighted signal in the L temporal lobe and post L parietal lobe MRI revealing CNS lymphoma areas improving Follow repeat MRI with contrast ordered on 01/20/2021 by medical oncology.     High grade B-cell lymphoma (HCC) Resolved neutropenia (HCC) Currently on acyclovir and PO cipro post-chemo Appreciate input by medical oncology     Iron deficiency anemia Monitor hematocrit and hemoglobin. Hemoglobin downtrending 9.5 from 10. Continue to trend.     Hyperlipidemia Currently not on medical therapy.   DVT prophylaxis: Subcu Lovenox daily Code Status: Full Family Communication: Daughter at her bedside.  Status is: Inpatient  Remains inpatient appropriate because: Severity of illness   Consultants:  Oncology  Procedures:  None.  Antimicrobials: Anti-infectives (From admission, onward)    Start     Dose/Rate Route Frequency Ordered Stop   01/15/21 1000  acyclovir (ZOVIRAX) 200 MG capsule 200 mg        200 mg Oral Daily 01/14/21 1747     01/15/21 0800  ciprofloxacin (CIPRO) tablet 500 mg  Status:  Discontinued        Note to Pharmacy: Start taking day AFTER chemo finishes for 15 days!!  You will do this with each cycle of chemotherapy!! Per Dr Jonette Eva     500 mg Oral Daily with breakfast 01/14/21 1741 01/17/21 0724   01/14/21 2000  fluconazole (DIFLUCAN) tablet 100 mg        100 mg Oral Daily 01/14/21 1741     01/14/21 1830  famciclovir (FAMVIR) tablet 125 mg  Status:  Discontinued        125 mg Oral Daily 01/14/21 1741 01/14/21 1746        Objective: Vitals:   01/19/21 0418 01/19/21 1335 01/19/21 2019 01/20/21 1439  BP: (!) 127/58 (!) 153/63 (!) 155/61 (!) 144/80  Pulse: 86 70 83 94  Resp: 18 19 14 18   Temp: 97.7 F (36.5 C) 97.9 F (36.6 C) 98.1 F (36.7 C) 98.4 F (36.9 C)  TempSrc: Oral Oral Oral Oral  SpO2: 100% 97% 99% 98%    Intake/Output Summary (Last 24 hours) at 01/20/2021 1458 Last data filed at 01/20/2021 1300 Gross per 24 hour  Intake 580.43 ml  Output 1800 ml  Net -1219.57 ml   There were no vitals filed for this visit.  Examination: General exam: Frail-appearing no acute distress.  She is alert and interactive.   Respiratory system: Clear to auscultation with no wheezes or rales. Cardiovascular system: Regular rate and rhythm no rubs or gallops.  No JVD aortomegaly noted.   Gastrointestinal system: Soft nontender normal bowel sounds present.  Central nervous system: Nonfocal. Extremities: Trace lower extremity edema bilaterally. Skin: No rashes or ulcerative lesions noted. Psychiatry: Mood is appropriate for condition and setting.  Data Reviewed: I have personally reviewed following labs and imaging studies  CBC: Recent Labs  Lab 01/16/21 0437 01/17/21 0230 01/18/21 0435 01/19/21 0432 01/20/21 0356  WBC 7.0 19.0* 25.8* 28.1* 28.8*  NEUTROABS 4.5 13.9* 16.0* 21.9* 17.5*  HGB 9.9* 10.5* 10.2* 10.0* 9.5*  HCT 31.4* 32.8* 32.1* 31.6* 30.3*  MCV 77.5* 78.3* 77.9* 79.2* 79.1*  PLT 112* 113* 107* 111* 007*   Basic Metabolic Panel: Recent Labs  Lab  01/14/21 1148 01/15/21 0521 01/16/21 0437 01/17/21 0230 01/19/21 0432 01/20/21 0355  NA 127* 130* 133* 133* 133*  --   K 3.5 3.4* 3.8 3.8 4.0  --   CL 92* 96* 102 101 101  --   CO2 27 28 26 23 27   --   GLUCOSE 122* 93 88 165* 106*  --   BUN 15 8 9 10 11   --   CREATININE 0.44 0.34* 0.34* 0.37* 0.35*  --   CALCIUM 9.1 8.4* 8.6* 8.6* 8.9  --   MG  --   --   --   --   --  1.9  PHOS  --   --   --   --   --  1.3*   GFR: CrCl cannot be calculated (Unknown ideal weight.). Liver Function Tests: Recent Labs  Lab 01/14/21 1148 01/15/21 0521 01/16/21 0437 01/17/21 0230 01/19/21 0432  AST 26 24 26 31 31   ALT 55* 59* 67* 69* 52*  ALKPHOS 72 77 121 196* 207*  BILITOT 1.0 0.7 0.8 0.9 0.3  PROT 6.3* 5.7* 5.5* 6.0* 5.8*  ALBUMIN 3.1* 2.7* 2.5* 2.8* 2.9*   No results for input(s): LIPASE, AMYLASE in the last 168 hours. Recent Labs  Lab 01/14/21 1203 01/15/21 0521  AMMONIA 37* 32   Coagulation Profile: No results for input(s): INR, PROTIME in the last 168 hours. Cardiac Enzymes: No results for input(s): CKTOTAL, CKMB, CKMBINDEX, TROPONINI in the last 168 hours. BNP (last 3 results) No results for input(s): PROBNP in the last 8760 hours. HbA1C: No results for input(s): HGBA1C in the last 72 hours. CBG: Recent Labs  Lab 01/15/21 0719  GLUCAP 78   Lipid Profile: No results for input(s): CHOL, HDL, LDLCALC, TRIG, CHOLHDL, LDLDIRECT in the last 72 hours. Thyroid Function Tests: No results for input(s): TSH, T4TOTAL, FREET4, T3FREE, THYROIDAB in the last 72 hours. Anemia Panel: No results for input(s): VITAMINB12, FOLATE, FERRITIN, TIBC, IRON, RETICCTPCT in the last 72 hours. Sepsis Labs: Recent Labs  Lab 01/14/21 1148  LATICACIDVEN 2.3*    Recent Results (from the past 240 hour(s))  Resp Panel by RT-PCR (Flu A&B, Covid) Nasopharyngeal Swab     Status: None   Collection Time: 01/14/21  2:09 PM   Specimen: Nasopharyngeal Swab; Nasopharyngeal(NP) swabs in vial transport  medium  Result Value Ref Range Status   SARS Coronavirus 2 by RT PCR NEGATIVE NEGATIVE Final    Comment: (NOTE) SARS-CoV-2 target nucleic acids are NOT DETECTED.  The SARS-CoV-2 RNA is generally detectable in upper respiratory specimens during the acute phase of infection. The lowest concentration of SARS-CoV-2 viral copies this assay can detect is 138 copies/mL. A negative result does not preclude SARS-Cov-2 infection and should not be used as the sole basis for treatment or other patient management decisions. A negative result may occur with  improper specimen collection/handling, submission of specimen other than nasopharyngeal swab, presence of viral mutation(s) within the areas targeted by this assay, and inadequate number of viral copies(<138 copies/mL). A negative result must be combined with clinical observations, patient history, and epidemiological information. The expected result is Negative.  Fact Sheet for Patients:  EntrepreneurPulse.com.au  Fact Sheet for Healthcare Providers:  IncredibleEmployment.be  This test is no t yet approved or cleared by the [place] FDA and  has been authorized for detection and/or diagnosis of SARS-CoV-2 by FDA under an Emergency Use Authorization (EUA). This EUA will remain  in effect (meaning this test can be used) for the duration of the COVID-19 declaration under Section 564(b)(1) of the Act, 21 U.S.C.section 360bbb-3(b)(1), unless the authorization is terminated  or revoked sooner.       Influenza A by PCR NEGATIVE NEGATIVE Final   Influenza B by PCR NEGATIVE NEGATIVE Final    Comment: (NOTE) The Xpert Xpress SARS-CoV-2/FLU/RSV plus assay is intended as an aid in the diagnosis of influenza from Nasopharyngeal swab specimens and should not be used as a sole basis for treatment. Nasal washings and aspirates are unacceptable for Xpert Xpress SARS-CoV-2/FLU/RSV testing.  Fact Sheet for  Patients: EntrepreneurPulse.com.au  Fact Sheet for Healthcare Providers: IncredibleEmployment.be  This test is not yet approved or cleared by the [place] FDA and has been authorized for detection and/or diagnosis of SARS-CoV-2 by FDA under an Emergency Use Authorization (EUA). This EUA will remain in effect (meaning this test can be used) for the duration of the COVID-19 declaration under Section 564(b)(1) of the Act, 21 U.S.C. section 360bbb-3(b)(1), unless the authorization is  terminated or revoked.  Performed at [facility], [address] [place], [place] [address]       Radiology Studies: No results found.  Scheduled Meds:  acyclovir  200 mg Oral Daily   Chlorhexidine Gluconate Cloth  6 each Topical Daily   dexamethasone  2 mg Oral Daily   enoxaparin (LOVENOX) injection  40 mg Subcutaneous Q24H   fluconazole  100 mg Oral Daily   levETIRAcetam  500 mg Oral BID   OLANZapine zydis  5 mg Oral QHS   Continuous Infusions:  0.9 % NaCl with KCl 20 mEq / L 25 mL/hr at 01/20/21 1041     LOS: 6 days   Kayleen Memos, MD [facility] Pager On Amion  If 7PM-7AM, please contact night-coverage 01/20/2021, 2:58 PM

## 2021-01-21 ENCOUNTER — Inpatient Hospital Stay (HOSPITAL_COMMUNITY): Payer: Medicare Other

## 2021-01-21 DIAGNOSIS — C8510 Unspecified B-cell lymphoma, unspecified site: Secondary | ICD-10-CM | POA: Diagnosis not present

## 2021-01-21 DIAGNOSIS — G9341 Metabolic encephalopathy: Secondary | ICD-10-CM | POA: Diagnosis not present

## 2021-01-21 DIAGNOSIS — C7931 Secondary malignant neoplasm of brain: Secondary | ICD-10-CM | POA: Diagnosis not present

## 2021-01-21 DIAGNOSIS — D72829 Elevated white blood cell count, unspecified: Secondary | ICD-10-CM

## 2021-01-21 DIAGNOSIS — D508 Other iron deficiency anemias: Secondary | ICD-10-CM

## 2021-01-21 DIAGNOSIS — E871 Hypo-osmolality and hyponatremia: Secondary | ICD-10-CM | POA: Diagnosis not present

## 2021-01-21 LAB — COMPREHENSIVE METABOLIC PANEL
ALT: 56 U/L — ABNORMAL HIGH (ref 0–44)
AST: 30 U/L (ref 15–41)
Albumin: 2.9 g/dL — ABNORMAL LOW (ref 3.5–5.0)
Alkaline Phosphatase: 178 U/L — ABNORMAL HIGH (ref 38–126)
Anion gap: 6 (ref 5–15)
BUN: 17 mg/dL (ref 8–23)
CO2: 27 mmol/L (ref 22–32)
Calcium: 8.6 mg/dL — ABNORMAL LOW (ref 8.9–10.3)
Chloride: 100 mmol/L (ref 98–111)
Creatinine, Ser: 0.4 mg/dL — ABNORMAL LOW (ref 0.44–1.00)
GFR, Estimated: >60 mL/min (ref >60)
Glucose, Bld: 132 mg/dL — ABNORMAL HIGH (ref 70–99)
Potassium: 4.5 mmol/L (ref 3.5–5.1)
Sodium: 133 mmol/L — ABNORMAL LOW (ref 135–145)
Total Bilirubin: 0.4 mg/dL (ref 0.3–1.2)
Total Protein: 6 g/dL — ABNORMAL LOW (ref 6.5–8.1)

## 2021-01-21 LAB — CBC WITH DIFFERENTIAL/PLATELET
Abs Immature Granulocytes: 8.82 10*3/uL — ABNORMAL HIGH (ref 0.00–0.07)
Basophils Absolute: 0.1 10*3/uL (ref 0.0–0.1)
Basophils Relative: 0 %
Eosinophils Absolute: 0 10*3/uL (ref 0.0–0.5)
Eosinophils Relative: 0 %
HCT: 32.7 % — ABNORMAL LOW (ref 36.0–46.0)
Hemoglobin: 10.3 g/dL — ABNORMAL LOW (ref 12.0–15.0)
Immature Granulocytes: 27 %
Lymphocytes Relative: 11 %
Lymphs Abs: 3.8 10*3/uL (ref 0.7–4.0)
MCH: 24.8 pg — ABNORMAL LOW (ref 26.0–34.0)
MCHC: 31.5 g/dL (ref 30.0–36.0)
MCV: 78.8 fL — ABNORMAL LOW (ref 80.0–100.0)
Monocytes Absolute: 2.3 10*3/uL — ABNORMAL HIGH (ref 0.1–1.0)
Monocytes Relative: 7 %
Neutro Abs: 18.2 10*3/uL — ABNORMAL HIGH (ref 1.7–7.7)
Neutrophils Relative %: 55 %
Platelets: 181 10*3/uL (ref 150–400)
RBC: 4.15 MIL/uL (ref 3.87–5.11)
RDW: 20.3 % — ABNORMAL HIGH (ref 11.5–15.5)
WBC: 33.1 10*3/uL — ABNORMAL HIGH (ref 4.0–10.5)
nRBC: 0.6 % — ABNORMAL HIGH (ref 0.0–0.2)

## 2021-01-21 LAB — PHOSPHORUS: Phosphorus: 2.1 mg/dL — ABNORMAL LOW (ref 2.5–4.6)

## 2021-01-21 IMAGING — MR MR HEAD W/ CM
4 series · 48 of 48 positions shown · IV contrast (gadavist)
Comparison: [DATE]

CLINICAL DATA: Hematologic malignancy, assess treatment response;
assess for shrinkage of CNS lymphoma

EXAM:
MRI HEAD WITH CONTRAST
TECHNIQUE: Multiplanar, multiecho pulse sequences of the brain and surrounding
structures were obtained with intravenous contrast.
CONTRAST:  6mL GADAVIST GADOBUTROL 1 MMOL/ML IV SOLN

[Series 5: T2 post-contrast · coronal · 5.0mm · 0.57mm/px · 6 of 28 slices shown]
[im 1/28]
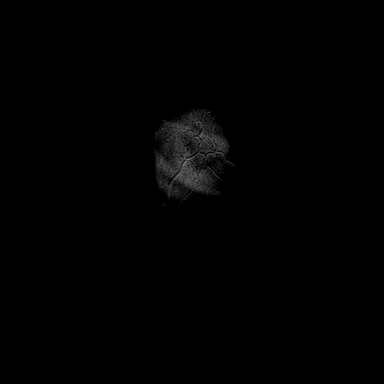
[im 6/28]
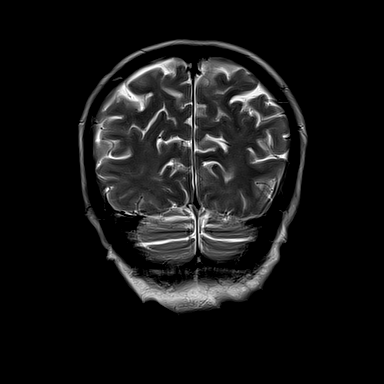
[im 11/28]
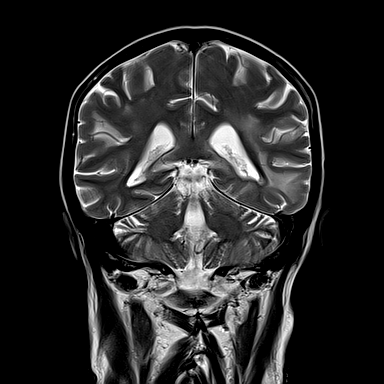
[im 17/28]
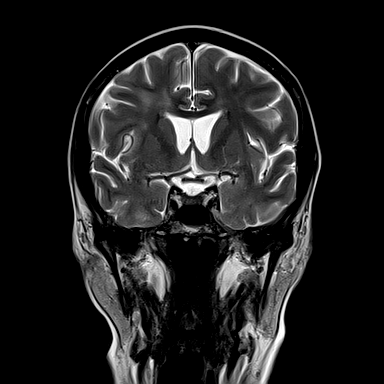
[im 22/28]
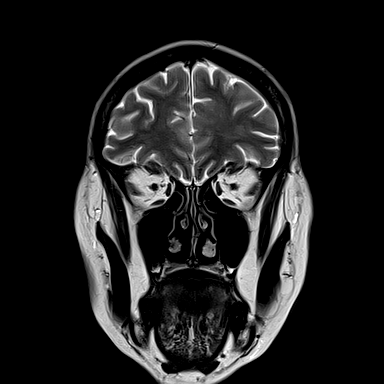
[im 28/28]
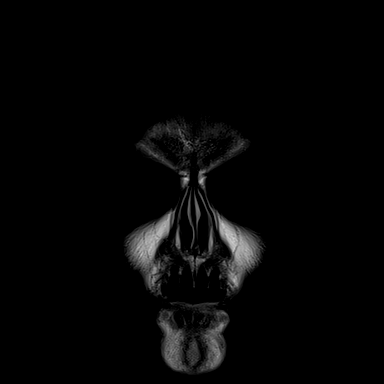

[Series 6: T1 post-contrast · axial · 1.0mm · 0.94mm/px · z∈[-47,+95]mm · 31 of 144 slices shown (1 of 3)]
[im 1/144]
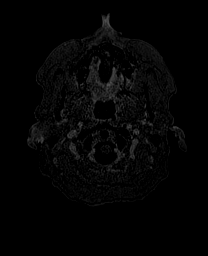
[im 5/144]
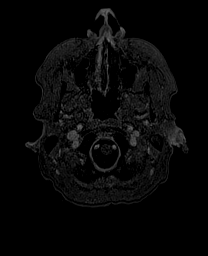
[im 10/144]
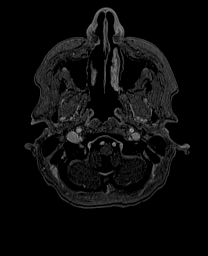
[im 15/144]
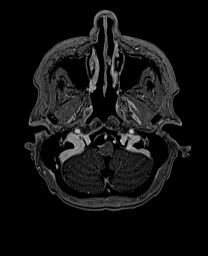
[im 20/144]
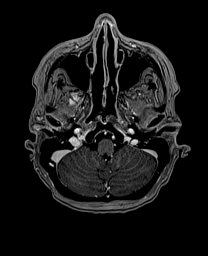
[im 24/144]
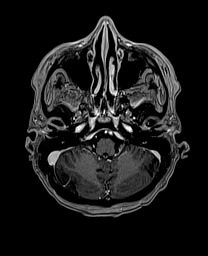
[im 29/144]
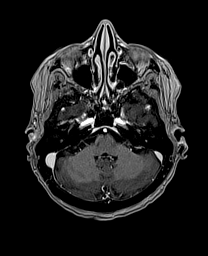
[im 34/144]
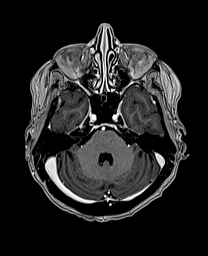
[im 39/144]
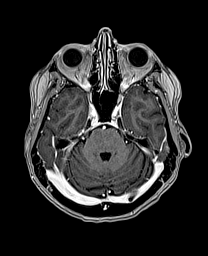
[im 43/144]
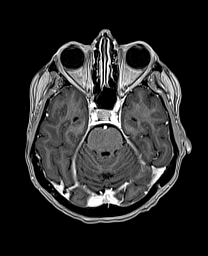
[im 48/144]
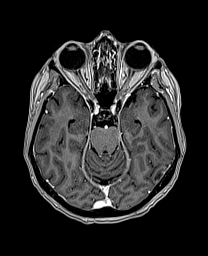
[im 53/144]
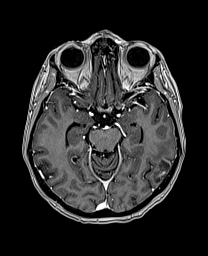
[im 58/144]
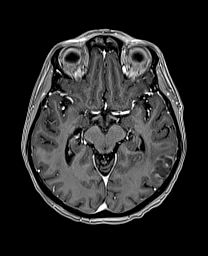
[im 62/144]
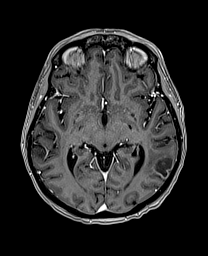
[im 67/144]
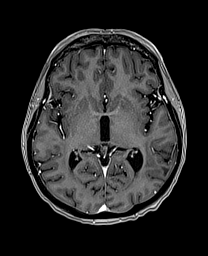
[im 72/144]
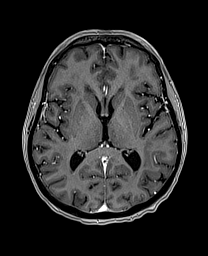
[im 77/144]
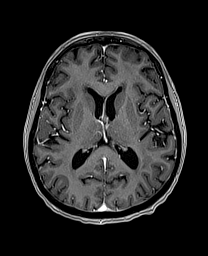
[im 82/144]
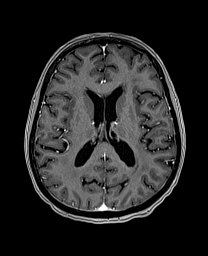
[im 86/144]
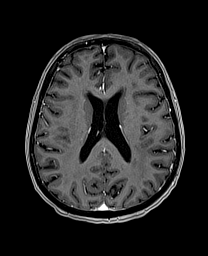
[im 91/144]
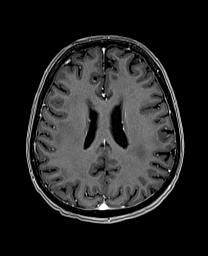
[im 96/144]
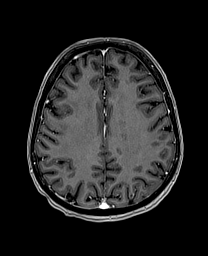
[im 101/144]
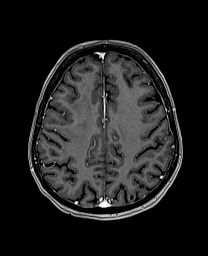
[im 105/144]
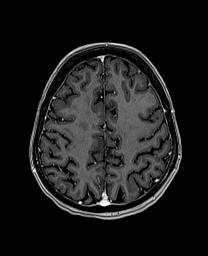
[im 110/144]
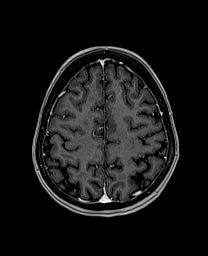
[im 115/144]
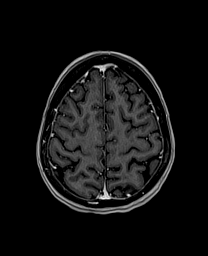
[im 120/144]
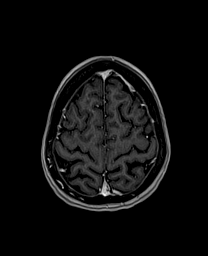
[im 124/144]
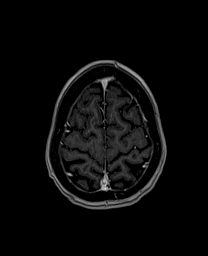
[im 129/144]
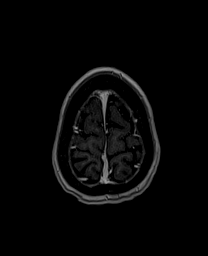
[im 134/144]
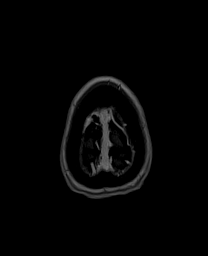
[im 139/144]
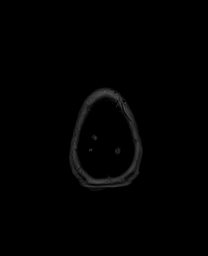
[im 144/144]
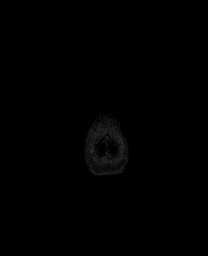

[Series 7: T1 post-contrast · coronal · 5.0mm · 0.43mm/px · 6 of 28 slices shown (2 of 3)]
[im 1/28]
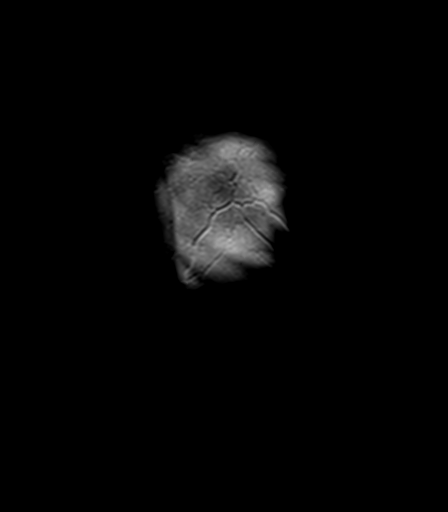
[im 6/28]
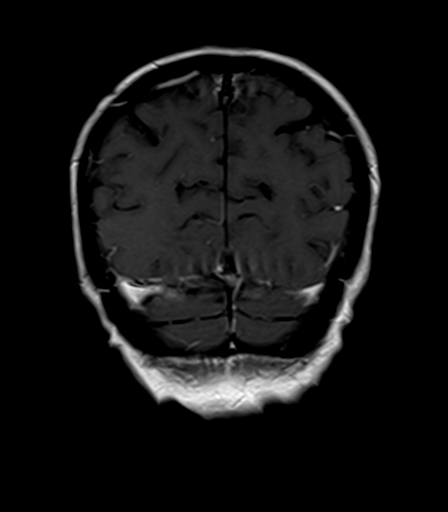
[im 11/28]
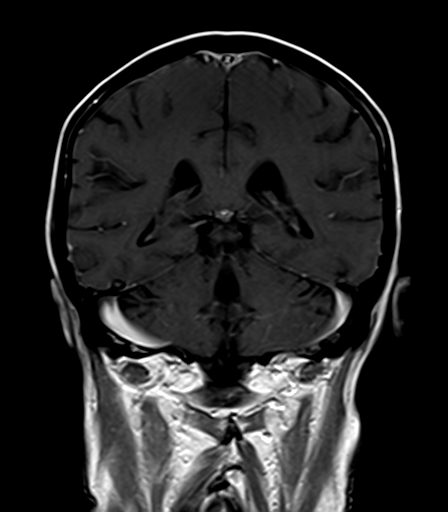
[im 17/28]
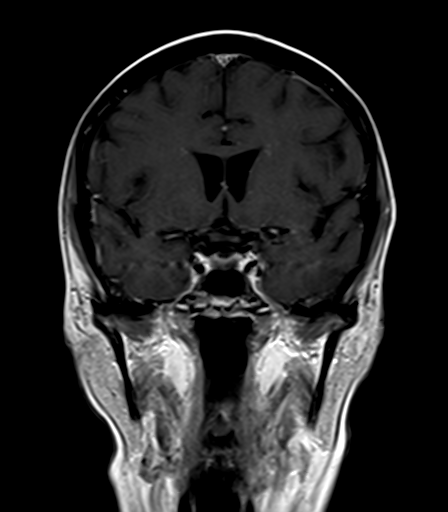
[im 22/28]
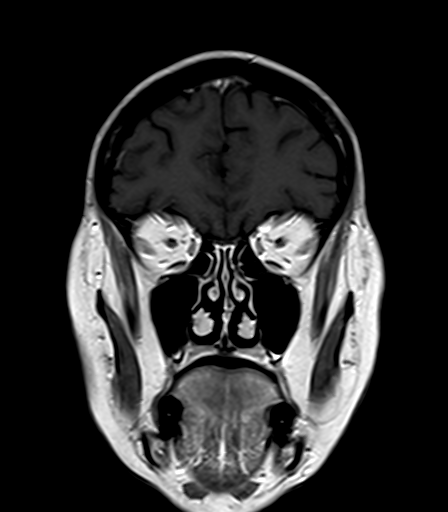
[im 28/28]
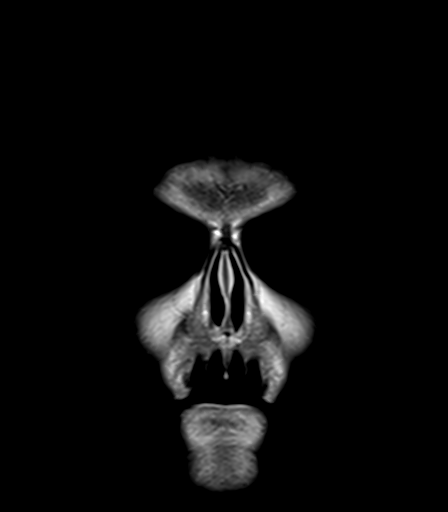

[Series 8: T1 post-contrast · sagittal · 5.0mm · 0.75mm/px · 5 of 24 slices shown (3 of 3)]
[im 1/24]
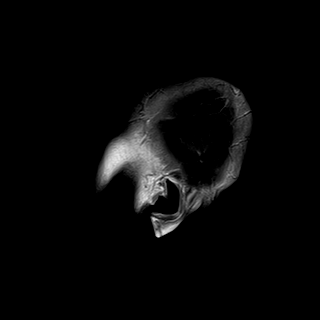
[im 6/24]
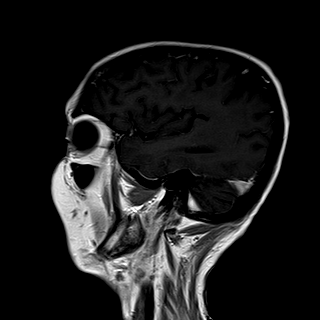
[im 12/24]
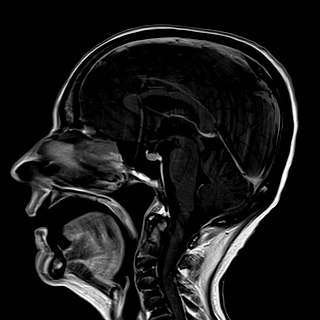
[im 18/24]
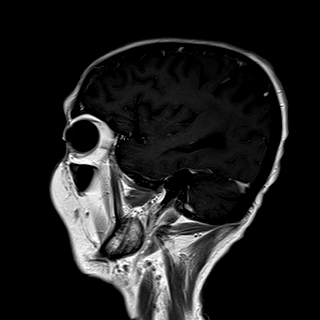
[im 24/24]
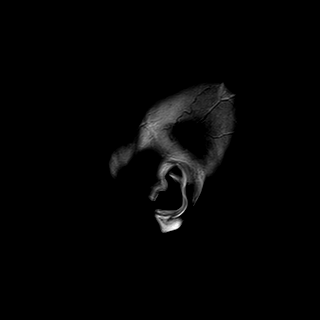

[48 of 48 positions shown; findings below may reference images not displayed]

FINDINGS: Brain: Post contrast imaging demonstrates substantially reduced
enhancement of the left posterior temporal lesion. Enhancement
associated with the left smaller left parietal lesion has
essentially resolved. No new mass or abnormal enhancement.

Vascular: Unremarkable.

Skull and upper cervical spine: Unremarkable.

Sinuses/Orbits: No new abnormality.

Other: None.
IMPRESSION: Significantly reduced enhancement associated with left temporal and
parietal lesions. Together with findings on recent noncontrast
study, this reflects positive treatment response.

## 2021-01-21 MED ORDER — GADOBUTROL 1 MMOL/ML IV SOLN
6.0000 mL | Freq: Once | INTRAVENOUS | Status: AC | PRN
  Administered 2021-01-21: 6 mL via INTRAVENOUS

## 2021-01-21 NOTE — Progress Notes (Signed)
Ms. Tigue is slowly but surely improving with her mental state.  She was very alert and oriented this morning.  We really had a nice conversation.  She was very surprised and excited that her daughter came up from [place].  This was certainly unexpected.  I am so happy for her.  She hopefully will have the MRI of the brain today.  She is not having any problems with pain.  She walked yesterday.  Her white cell count is still trending upward.  I suppose this could still be the Neulasta that she had for her chemotherapy.  I do not see any evidence of any infection.  Her labs show white cell count 33.1.  Hemoglobin 10.3.  Platelet count 181,000.  Her sodium is 133.  Potassium 4.5.  BUN 17 creatinine 0.4.  Her albumin is 2.9..  She is eating okay.  There is no nausea or vomiting.  Her vital signs show temperature 98.3.  Pulse 76.  Blood pressure 131/72.  Oxygen saturation 99%.  Her lungs sound clear bilaterally.  Oral exam shows no mucositis or ulcerations.  Cardiac exam regular rate and rhythm.  Abdomen is soft.  She has decent bowel sounds.  There is no guarding or rebound tenderness.  Neurological exam shows no focal neurological deficits.  Am just happy to see that she is getting back to her baseline mental state.  I am very happy for her.  It really would be nice to see what the MRI of the brain has to show with respect to these CNS lesions.  Hopefully, they are continuing to involute.  Given her current status, I actually may do treatment on her Monday.  I probably would just try to get her up to the sixth floor at some point.  I am just happy that she is getting back to her baseline mental state.  I know that having her family with her is definitely helping her out.  I also know that the incredible care that she is getting from the staff on [address]. is making a big difference with her improvement.  [name], MD  Oswaldo Milian 41:10

## 2021-01-21 NOTE — TOC Progression Note (Signed)
Transition of Care [facility]) - Progression Note    Patient Details  Name: Ariel [name] MRN: [id number] Date of Birth: 25-Mar-1955  Transition of Care [facility]) CM/SW [place], [place] Phone Number: 01/21/2021, 1:28 PM  Clinical Narrative:   Spoke with patient and her two offspring by request of daughter, who wanted clarification about available care following current course of treatment, whatever that might be.  I explained the difference between short term rehab and long term care, as well as difference between nursing home and ALF.  Daughter states that Ms Bullen has long term care insurance, and that she knows that there is a 90 day wait period before it can be applied.  She states that they can do self pay prior to insurance kicking in.  They are awaiting the results of the MRI to find out from Dr Marin Olp the next recommended course of treatment, and understand that the type of facility they look for will depend on patient's condition at the end of the upcoming round of treatment. TOC will continue to follow during the course of hospitalization.     Expected Discharge Plan:  (to be determined) Barriers to Discharge: Continued Medical Work up  Expected Discharge Plan and Services Expected Discharge Plan:  (to be determined)   Discharge Planning Services: CM Consult Post Acute Care Choice: [place] arrangements for the past 2 months: Single Family Home                                       Social Determinants of Health (SDOH) Interventions    Readmission Risk Interventions No flowsheet data found.

## 2021-01-21 NOTE — Progress Notes (Signed)
Physical Therapy Treatment Patient Details Name: Ariel [name] MRN: [id number] DOB: 08/19/1955 Today's Date: 01/21/2021   History of Present Illness Ariel [name] is a 65 y.o. female with medical history significant of HLD, IDA, TIA in 09/2020, melanoma of the right hip 2021 stage IB and vitreous hemorrhage of the right eye s/p vitrectomy 12/07/20 diagnosed 12/15/20 with metastatic B-cell lymphoma including metastatic brain disease to L temporal and parietal lobes.  Pt admitted from home 01/14/21 with AMS.    PT Comments    Pt ambulated 400' holding IV pole, no loss of balance. She reports she's been ambulating in the halls several times per day without loss of balance. She also reports independence with LE strengthening exercises. She has met PT goals and is mobilizing at a modified independent level, will sign off. Encouraged pt to continue ambulating in the halls several times/day and to continue her strengthening exercises independently. Pt plans to use a cane when ambulating at home.   Recommendations for follow up therapy are one component of a multi-disciplinary discharge planning process, led by the attending physician.  Recommendations may be updated based on patient status, additional functional criteria and insurance authorization.  Follow Up Recommendations  No PT follow up     Assistance Recommended at Discharge Set up Supervision/Assistance  Equipment Recommendations  None recommended by PT    Recommendations for Other Services       Precautions / Restrictions Precautions Precautions: None Restrictions Weight Bearing Restrictions: No     Mobility  Bed Mobility               General bed mobility comments: up in recliner    Transfers Overall transfer level: Independent Equipment used: None Transfers: Sit to/from Stand Sit to Stand: Independent                Ambulation/Gait Ambulation/Gait assistance: Modified independent (Device/Increase time) Gait  Distance (Feet): 400 Feet Assistive device: IV Pole Gait Pattern/deviations: Decreased stride length;Step-through pattern       General Gait Details: steady, no loss of balance with turns, held IV pole   Stairs             Wheelchair Mobility    Modified Rankin (Stroke Patients Only)       Balance Overall balance assessment: Modified Independent                                          Cognition Arousal/Alertness: Awake/alert Behavior During Therapy: WFL for tasks assessed/performed Overall Cognitive Status: Within Functional Limits for tasks assessed                                          Exercises General Exercises - Lower Extremity Mini-Sqauts: AROM;10 reps    General Comments        Pertinent Vitals/Pain Pain Assessment: No/denies pain    Home Living                          Prior Function            PT Goals (current goals can now be found in the care plan section) Acute Rehab PT Goals Patient Stated Goal: Regain IND PT Goal Formulation: With patient Time For Goal Achievement: 01/30/21 Potential to  Achieve Goals: Fair Progress towards PT goals: Goals met/education completed, patient discharged from PT    Frequency    Min 3X/week      PT Plan Discharge plan needs to be updated    Co-evaluation              AM-PAC PT "6 Clicks" Mobility   Outcome Measure  Help needed turning from your back to your side while in a flat bed without using bedrails?: None Help needed moving from lying on your back to sitting on the side of a flat bed without using bedrails?: None Help needed moving to and from a bed to a chair (including a wheelchair)?: None Help needed standing up from a chair using your arms (e.g., wheelchair or bedside chair)?: None Help needed to walk in hospital room?: None Help needed climbing 3-5 steps with a railing? : None 6 Click Score: 24    End of Session   Activity  Tolerance: Patient tolerated treatment well Patient left: in chair;with call bell/phone within reach;with family/visitor present Nurse Communication: Mobility status PT Visit Diagnosis: Difficulty in walking, not elsewhere classified (R26.2);Unsteadiness on feet (R26.81);Muscle weakness (generalized) (M62.81)     Time: 2993-7169 PT Time Calculation (min) (ACUTE ONLY): 11 min  Charges:  $Gait Training: 8-22 mins                     Blondell Reveal Kistler PT 01/21/2021  Acute Rehabilitation Services Pager [phone] Office [phone]

## 2021-01-21 NOTE — Care Management Important Message (Signed)
Important Message  Patient Details IM Letter placed in Patients room. Name: Ariel [name] MRN: [id number] Date of Birth: 1955/11/22   Medicare Important Message Given:  Yes     [name] 01/21/2021, 12:27 PM

## 2021-01-21 NOTE — Progress Notes (Addendum)
PROGRESS NOTE    Ariel [name]  MVH:[id number] DOB: 10/28/1955 DOA: 01/14/2021 PCP: [name], NP    Brief Narrative:  65 year old female with history of metastatic B-cell lymphoma with brain mets who presented from home due to progressively worsening altered mental status.  Patient was started on Decadron 12 weeks ago and has been having decline of her mentation.  On admission, she was on Decadron 5 mg twice daily.  She presented with severe behavioral changes and aggressive behavior at home.  She was brought into the ED for further evaluation.  Upon presentation to the ED she was agitated and received a dose of Zyprexa with improvement.  EDP contacted Dr. Marin Olp, her medical oncologist, who recommended admission and observe for 48 hours.  When she was evaluated, her mental status was better, but she seemed to be unable to focus.  She denied headache, chest pain or abdominal pain but was complaining of lower back pain.  Denied rhinorrhea, sore throat, productive cough, wheezing or hemoptysis.  No lower extremity edema.  No dysuria, frequency or hematuria.  No polyuria, polydipsia, polyphagia or blurred vision.  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  SUBJECTIVE: Patient was seen at her bedside.  Her daughter, Mendel Ryder, and son, [place], were present in the room.  She reports that her appetite has improved, she walked the halls 3 times and has been doing arm/leg exercises. She reports "feeling strong" each day. No concerns from family.   Assessment & Plan:   Principal Problem:   AMS (altered mental status) Active Problems:   Hyperlipidemia   Iron deficiency anemia   Hyponatremia   Brain metastasis (HCC)   Acute metabolic encephalopathy   High grade B-cell lymphoma (HCC)   Neutropenia (HCC)  Principal Problem: Resolved acute toxic metabolic encephalopathy Suspect related to steroid use prior to admit in the setting of poor PO  intake, acute illness, and weeks of poor sleep. - Tapering of the steroid per medical oncology, now on dexamethasone 2mg  daily with good mentation - Continue on Zyprexa 5 mg at bedtime - Delirium precautions - Family is unable to care for patient. Will not have adequate 24hr supervision, thus SNF is recommended. TOC following   Hyponatremia, stable Patient euvolemic, hyponatremia likely multifactorial - hypovolemic, can consider SIADH component given CNS mets - Encourage normal PO intake - trend BMP periodically   High grade B-cell lymphoma  C/b Brain metastasis (HCC) CT head with findings of decreased density of lesions in L temporal and parietal lobes which may reflect evolving post-treatment change - now s/p decadron wean given concern for steroid related cognitive dysfunction - c/w decadron 2mg  daily No focal neurologic changes. Pt seen ambulating in hall with therapy.  - MRI 11/25: Significantly reduced enhancement associated with left temporal and parietal lesions. Together with findings on recent noncontrast study, this reflects positive treatment response.  - Oncology following, plan for chemo next week   Resolved neutropenia (HCC) - Currently on acyclovir and PO cipro post-chemo - patient s/p Neulasta, now w/elevated WBC  Leukocytosis Appears to be related to Neulasta, uptrending. CTM for s/sx of infection   Iron deficiency anemia Monitor hematocrit and hemoglobin. Hemoglobin downtrending 9.5 from 10. Continue to trend.   Hyperlipidemia Currently not on medical therapy. Coronary Calcium 8 in 2020. - can consider initiation of statin outpatient   DVT prophylaxis: Subcu Lovenox daily Code Status: Full Family Communication: Daughter/son at her bedside.  Status is: Inpatient  Remains inpatient appropriate  because: Severity of illness, inpatient chemo   Consultants:  Oncology  Procedures:  None.  Antimicrobials: Anti-infectives (From admission, onward)     Start     Dose/Rate Route Frequency Ordered Stop   01/15/21 1000  acyclovir (ZOVIRAX) 200 MG capsule 200 mg        200 mg Oral Daily 01/14/21 1747     01/15/21 0800  ciprofloxacin (CIPRO) tablet 500 mg  Status:  Discontinued       Note to Pharmacy: Start taking day AFTER chemo finishes for 15 days!!  You will do this with each cycle of chemotherapy!! Per Dr Jonette Eva     500 mg Oral Daily with breakfast 01/14/21 1741 01/17/21 0724   01/14/21 2000  fluconazole (DIFLUCAN) tablet 100 mg        100 mg Oral Daily 01/14/21 1741     01/14/21 1830  famciclovir (FAMVIR) tablet 125 mg  Status:  Discontinued        125 mg Oral Daily 01/14/21 1741 01/14/21 1746        Objective: Vitals:   01/19/21 2019 01/20/21 1439 01/20/21 2014 01/21/21 0507  BP: (!) 155/61 (!) 144/80 139/68 131/72  Pulse: 83 94 91 76  Resp: 14 18 20 20   Temp: 98.1 F (36.7 C) 98.4 F (36.9 C) 97.8 F (36.6 C) 98.3 F (36.8 C)  TempSrc: Oral Oral Oral Oral  SpO2: 99% 98% 99% 99%    Intake/Output Summary (Last 24 hours) at 01/21/2021 1038 Last data filed at 01/21/2021 0700 Gross per 24 hour  Intake 480 ml  Output --  Net 480 ml    There were no vitals filed for this visit.  Examination: General exam: elderly female, smiling, in no acute distress.  She is alert and interactive. HEENT: anisocoria (30mm R, 30mm L) w/equal reactivity   Respiratory system: Clear to auscultation with no wheezes or rales. Cardiovascular system: Regular rate and rhythm no rubs or gallops.  No JVD aortomegaly noted.   Gastrointestinal system: Soft nontender normal bowel sounds present.  Neuro: equal sensation and intact throughout, CNs grossly intact, 5/5 UE strength, 4/5 LE strength bilat. Extremities: Trace lower extremity edema bilaterally. Skin: No rashes or ulcerative lesions noted. Psychiatry: Mood is appropriate for condition and setting.  Data Reviewed: I have personally reviewed following labs and imaging studies  CBC: Recent  Labs  Lab 01/17/21 0230 01/18/21 0435 01/19/21 0432 01/20/21 0356 01/21/21 0410  WBC 19.0* 25.8* 28.1* 28.8* 33.1*  NEUTROABS 13.9* 16.0* 21.9* 17.5* 18.2*  HGB 10.5* 10.2* 10.0* 9.5* 10.3*  HCT 32.8* 32.1* 31.6* 30.3* 32.7*  MCV 78.3* 77.9* 79.2* 79.1* 78.8*  PLT 113* 107* 111* 125* 160    Basic Metabolic Panel: Recent Labs  Lab 01/15/21 0521 01/16/21 0437 01/17/21 0230 01/19/21 0432 01/20/21 0355 01/21/21 0410 01/21/21 0825  NA 130* 133* 133* 133*  --  133*  --   K 3.4* 3.8 3.8 4.0  --  4.5  --   CL 96* 102 101 101  --  100  --   CO2 28 26 23 27   --  27  --   GLUCOSE 93 88 165* 106*  --  132*  --   BUN 8 9 10 11   --  17  --   CREATININE 0.34* 0.34* 0.37* 0.35*  --  0.40*  --   CALCIUM 8.4* 8.6* 8.6* 8.9  --  8.6*  --   MG  --   --   --   --  1.9  --   --   PHOS  --   --   --   --  1.3*  --  2.1*    GFR: CrCl cannot be calculated (Unknown ideal weight.). Liver Function Tests: Recent Labs  Lab 01/15/21 0521 01/16/21 0437 01/17/21 0230 01/19/21 0432 01/21/21 0410  AST 24 26 31 31 30   ALT 59* 67* 69* 52* 56*  ALKPHOS 77 121 196* 207* 178*  BILITOT 0.7 0.8 0.9 0.3 0.4  PROT 5.7* 5.5* 6.0* 5.8* 6.0*  ALBUMIN 2.7* 2.5* 2.8* 2.9* 2.9*    No results for input(s): LIPASE, AMYLASE in the last 168 hours. Recent Labs  Lab 01/14/21 1203 01/15/21 0521  AMMONIA 37* 32    Coagulation Profile: No results for input(s): INR, PROTIME in the last 168 hours. Cardiac Enzymes: No results for input(s): CKTOTAL, CKMB, CKMBINDEX, TROPONINI in the last 168 hours. BNP (last 3 results) No results for input(s): PROBNP in the last 8760 hours. HbA1C: No results for input(s): HGBA1C in the last 72 hours. CBG: Recent Labs  Lab 01/15/21 0719  GLUCAP 78    Lipid Profile: No results for input(s): CHOL, HDL, LDLCALC, TRIG, CHOLHDL, LDLDIRECT in the last 72 hours. Thyroid Function Tests: No results for input(s): TSH, T4TOTAL, FREET4, T3FREE, THYROIDAB in the last 72  hours. Anemia Panel: No results for input(s): VITAMINB12, FOLATE, FERRITIN, TIBC, IRON, RETICCTPCT in the last 72 hours. Sepsis Labs: Recent Labs  Lab 01/14/21 1148  LATICACIDVEN 2.3*     Recent Results (from the past 240 hour(s))  Resp Panel by RT-PCR (Flu A&B, Covid) Nasopharyngeal Swab     Status: None   Collection Time: 01/14/21  2:09 PM   Specimen: Nasopharyngeal Swab; Nasopharyngeal(NP) swabs in vial transport medium  Result Value Ref Range Status   SARS Coronavirus 2 by RT PCR NEGATIVE NEGATIVE Final    Comment: (NOTE) SARS-CoV-2 target nucleic acids are NOT DETECTED.  The SARS-CoV-2 RNA is generally detectable in upper respiratory specimens during the acute phase of infection. The lowest concentration of SARS-CoV-2 viral copies this assay can detect is 138 copies/mL. A negative result does not preclude SARS-Cov-2 infection and should not be used as the sole basis for treatment or other patient management decisions. A negative result may occur with  improper specimen collection/handling, submission of specimen other than nasopharyngeal swab, presence of viral mutation(s) within the areas targeted by this assay, and inadequate number of viral copies(<138 copies/mL). A negative result must be combined with clinical observations, patient history, and epidemiological information. The expected result is Negative.  Fact Sheet for Patients:  EntrepreneurPulse.com.au  Fact Sheet for Healthcare Providers:  IncredibleEmployment.be  This test is no t yet approved or cleared by the [place] FDA and  has been authorized for detection and/or diagnosis of SARS-CoV-2 by FDA under an Emergency Use Authorization (EUA). This EUA will remain  in effect (meaning this test can be used) for the duration of the COVID-19 declaration under Section 564(b)(1) of the Act, 21 U.S.C.section 360bbb-3(b)(1), unless the authorization is terminated  or revoked  sooner.       Influenza A by PCR NEGATIVE NEGATIVE Final   Influenza B by PCR NEGATIVE NEGATIVE Final    Comment: (NOTE) The Xpert Xpress SARS-CoV-2/FLU/RSV plus assay is intended as an aid in the diagnosis of influenza from Nasopharyngeal swab specimens and should not be used as a sole basis for treatment. Nasal washings and aspirates are unacceptable for Xpert Xpress SARS-CoV-2/FLU/RSV testing.  Fact Sheet for Patients:  EntrepreneurPulse.com.au  Fact Sheet for Healthcare Providers: IncredibleEmployment.be  This test is not yet approved or cleared by the [place] FDA and has been authorized for detection and/or diagnosis of SARS-CoV-2 by FDA under an Emergency Use Authorization (EUA). This EUA will remain in effect (meaning this test can be used) for the duration of the COVID-19 declaration under Section 564(b)(1) of the Act, 21 U.S.C. section 360bbb-3(b)(1), unless the authorization is terminated or revoked.  Performed at [facility], [address] [place], [place] [address]   Urine Culture     Status: None (Preliminary result)   Collection Time: 01/19/21  6:29 PM   Specimen: In/Out Cath Urine  Result Value Ref Range Status   Specimen Description   Final    IN/OUT CATH URINE Performed at [facility], [place] [address] [place], [place] [address]    Special Requests   Final    Immunocompromised Performed at [facility], [place] [address], [place], [place] [address]    Culture   Final    CULTURE REINCUBATED FOR BETTER GROWTH Performed at [facility], [place] [address], [place],  [address]    Report Status PENDING  Incomplete      Radiology Studies: MR BRAIN W CONTRAST  Result Date: 01/21/2021 CLINICAL DATA:  Hematologic malignancy, assess treatment response; assess for shrinkage of CNS lymphoma EXAM: MRI HEAD WITH CONTRAST TECHNIQUE: Multiplanar, multiecho pulse  sequences of the brain and surrounding structures were obtained with intravenous contrast. CONTRAST:  43mL GADAVIST GADOBUTROL 1 MMOL/ML IV SOLN COMPARISON:  01/15/2021 FINDINGS: Brain: Post contrast imaging demonstrates substantially reduced enhancement of the left posterior temporal lesion. Enhancement associated with the left smaller left parietal lesion has essentially resolved. No new mass or abnormal enhancement. Vascular: Unremarkable. Skull and upper cervical spine: Unremarkable. Sinuses/Orbits: No new abnormality. Other: None. IMPRESSION: Significantly reduced enhancement associated with left temporal and parietal lesions. Together with findings on recent noncontrast study, this reflects positive treatment response. Electronically Signed   By: Macy Mis M.D.   On: 01/21/2021 09:45    Scheduled Meds:  acyclovir  200 mg Oral Daily   Chlorhexidine Gluconate Cloth  6 each Topical Daily   dexamethasone  2 mg Oral Daily   enoxaparin (LOVENOX) injection  40 mg Subcutaneous Q24H   fluconazole  100 mg Oral Daily   levETIRAcetam  500 mg Oral BID   OLANZapine zydis  5 mg Oral QHS   Continuous Infusions:  0.9 % NaCl with KCl 20 mEq / L 25 mL/hr at 01/20/21 2055     LOS: 7 days   Cecille Rubin, MD MPH [facility] Pager On Amion  If 7PM-7AM, please contact night-coverage 01/21/2021, 10:38 AM

## 2021-01-22 DIAGNOSIS — G9341 Metabolic encephalopathy: Secondary | ICD-10-CM | POA: Diagnosis not present

## 2021-01-22 DIAGNOSIS — E785 Hyperlipidemia, unspecified: Secondary | ICD-10-CM

## 2021-01-22 DIAGNOSIS — R400 Somnolence: Secondary | ICD-10-CM

## 2021-01-22 DIAGNOSIS — C7931 Secondary malignant neoplasm of brain: Secondary | ICD-10-CM | POA: Diagnosis not present

## 2021-01-22 DIAGNOSIS — D709 Neutropenia, unspecified: Secondary | ICD-10-CM

## 2021-01-22 DIAGNOSIS — C8510 Unspecified B-cell lymphoma, unspecified site: Secondary | ICD-10-CM | POA: Diagnosis not present

## 2021-01-22 LAB — CBC WITH DIFFERENTIAL/PLATELET
Abs Immature Granulocytes: 8.57 10*3/uL — ABNORMAL HIGH (ref 0.00–0.07)
Basophils Absolute: 0.1 10*3/uL (ref 0.0–0.1)
Basophils Relative: 0 %
Eosinophils Absolute: 0 10*3/uL (ref 0.0–0.5)
Eosinophils Relative: 0 %
HCT: 31.1 % — ABNORMAL LOW (ref 36.0–46.0)
Hemoglobin: 9.9 g/dL — ABNORMAL LOW (ref 12.0–15.0)
Immature Granulocytes: 27 %
Lymphocytes Relative: 12 %
Lymphs Abs: 3.9 10*3/uL (ref 0.7–4.0)
MCH: 25.1 pg — ABNORMAL LOW (ref 26.0–34.0)
MCHC: 31.8 g/dL (ref 30.0–36.0)
MCV: 78.7 fL — ABNORMAL LOW (ref 80.0–100.0)
Monocytes Absolute: 1.7 10*3/uL — ABNORMAL HIGH (ref 0.1–1.0)
Monocytes Relative: 5 %
Neutro Abs: 17.7 10*3/uL — ABNORMAL HIGH (ref 1.7–7.7)
Neutrophils Relative %: 56 %
Platelets: 217 10*3/uL (ref 150–400)
RBC: 3.95 MIL/uL (ref 3.87–5.11)
RDW: 20.4 % — ABNORMAL HIGH (ref 11.5–15.5)
WBC: 31.8 10*3/uL — ABNORMAL HIGH (ref 4.0–10.5)
nRBC: 0.8 % — ABNORMAL HIGH (ref 0.0–0.2)

## 2021-01-22 LAB — COMPREHENSIVE METABOLIC PANEL
ALT: 50 U/L — ABNORMAL HIGH (ref 0–44)
AST: 28 U/L (ref 15–41)
Albumin: 2.9 g/dL — ABNORMAL LOW (ref 3.5–5.0)
Alkaline Phosphatase: 146 U/L — ABNORMAL HIGH (ref 38–126)
Anion gap: 7 (ref 5–15)
BUN: 16 mg/dL (ref 8–23)
CO2: 29 mmol/L (ref 22–32)
Calcium: 8.9 mg/dL (ref 8.9–10.3)
Chloride: 95 mmol/L — ABNORMAL LOW (ref 98–111)
Creatinine, Ser: <0.3 mg/dL — ABNORMAL LOW (ref 0.44–1.00)
Glucose, Bld: 115 mg/dL — ABNORMAL HIGH (ref 70–99)
Potassium: 3.9 mmol/L (ref 3.5–5.1)
Sodium: 131 mmol/L — ABNORMAL LOW (ref 135–145)
Total Bilirubin: 0.3 mg/dL (ref 0.3–1.2)
Total Protein: 5.8 g/dL — ABNORMAL LOW (ref 6.5–8.1)

## 2021-01-22 LAB — URINE CULTURE

## 2021-01-22 LAB — LACTATE DEHYDROGENASE: LDH: 494 U/L — ABNORMAL HIGH (ref 98–192)

## 2021-01-22 NOTE — Progress Notes (Signed)
PROGRESS NOTE    Ariel [name]  AJO:[id number] DOB: 27-Jul-1955 DOA: 01/14/2021 PCP: [name], NP   Brief Narrative: Ariel [name] is a 65 y.o. female with history of metastatic B-cell lymphoma with brain mets who presented from home due to progressively worsening altered mental status.  Patient was started on Decadron 12 weeks ago and has been having decline of her mentation.  On admission, she was on Decadron 5 mg twice daily.  She presented with severe behavioral changes and aggressive behavior at home.  She was brought into the ED for further evaluation.  Upon presentation to the ED she was agitated and received a dose of Zyprexa with improvement.  EDP contacted Dr. Marin Olp, her medical oncologist, who recommended admission and observe for 48 hours.  When she was evaluated, her mental status was better, but she seemed to be unable to focus.  She denied headache, chest pain or abdominal pain but was complaining of lower back pain.  Denied rhinorrhea, sore throat, productive cough, wheezing or hemoptysis.  No lower extremity edema.  No dysuria, frequency or hematuria.  No polyuria, polydipsia, polyphagia or blurred vision.  Given concern for CNS lymphoma, MRI brain with contrast ordered by medical oncology which was reassuring for improvement.   Assessment & Plan:   Principal Problem:   AMS (altered mental status) Active Problems:   Hyperlipidemia   Iron deficiency anemia   Hyponatremia   Brain metastasis (HCC)   Acute metabolic encephalopathy   High grade B-cell lymphoma (HCC)   Neutropenia (HCC)   Acute toxic/metabolic encephalopathy Resolved. Likely related to steroid use/acute illness/poor sleep.  Hyponatremia Mild. Improved and now stable.  High grade B-cell lymphoma Hematology/oncology consulted. Repeat head CT/MRI suggests improving metastatic lesions with treatment. On steroids, which have been decreased secondary to concern for steroid induced  psychosis. -Heme/onc recommendations: plan for chemotherapy on 11/28, Decadron 2 mg daily  Neutropenia Resolved with Neulasta. -Continue acyclovir/Cipro  Leukocytosis Likely related to Neulasta. No evidence of infection. Appears to have peaked.  Iron deficiency anemia Iron panel consistent with chronic disease. Hemoglobin stable.  Hyperlipidemia Appears to be improved. LDL of 83. Not on medical therapy.   DVT prophylaxis: Lovenox Code Status:   Code Status: Full Code Family Communication: None at bedside Disposition Plan: Discharge home pending hematology/oncology recommendations   Consultants:  Hematology oncology  Procedures:  None  Antimicrobials: Fluconazole Ciprofloxacin Acyclovir    Subjective: No concerns today. Optimistic about her cancer treatment.  Objective: Vitals:   01/21/21 0507 01/21/21 1257 01/21/21 2101 01/22/21 0349  BP: 131/72 127/62 130/67 116/75  Pulse: 76 79 73 98  Resp: 20 18 18 18   Temp: 98.3 F (36.8 C) 98.1 F (36.7 C) 97.8 F (36.6 C) 97.7 F (36.5 C)  TempSrc: Oral Oral Oral Oral  SpO2: 99% 100% 100% 98%    Intake/Output Summary (Last 24 hours) at 01/22/2021 1230 Last data filed at 01/21/2021 2000 Gross per 24 hour  Intake 480 ml  Output 200 ml  Net 280 ml   There were no vitals filed for this visit.  Examination:  General exam: Appears calm and comfortable Respiratory system: Clear to auscultation. Respiratory effort normal. Cardiovascular system: S1 & S2 heard, RRR. No murmurs, rubs, gallops or clicks. Gastrointestinal system: Abdomen is nondistended, soft and nontender. No organomegaly or masses felt. Normal bowel sounds heard. Central nervous system: Alert and oriented. No focal neurological deficits. Musculoskeletal: No edema. No calf tenderness Skin: No cyanosis. No rashes Psychiatry: Judgement and insight appear normal.  Mood & affect appropriate.     Data Reviewed: I have personally reviewed following labs  and imaging studies  CBC Lab Results  Component Value Date   WBC 31.8 (H) 01/22/2021   RBC 3.95 01/22/2021   HGB 9.9 (L) 01/22/2021   HCT 31.1 (L) 01/22/2021   MCV 78.7 (L) 01/22/2021   MCH 25.1 (L) 01/22/2021   PLT 217 01/22/2021   MCHC 31.8 01/22/2021   RDW 20.4 (H) 01/22/2021   LYMPHSABS 3.9 01/22/2021   MONOABS 1.7 (H) 01/22/2021   EOSABS 0.0 01/22/2021   BASOSABS 0.1 41/28/7867     Last metabolic panel Lab Results  Component Value Date   NA 131 (L) 01/22/2021   K 3.9 01/22/2021   CL 95 (L) 01/22/2021   CO2 29 01/22/2021   BUN 16 01/22/2021   CREATININE <0.30 (L) 01/22/2021   GLUCOSE 115 (H) 01/22/2021   GFRNONAA NOT CALCULATED 01/22/2021   GFRAA >60 09/30/2019   CALCIUM 8.9 01/22/2021   PHOS 2.1 (L) 01/21/2021   PROT 5.8 (L) 01/22/2021   ALBUMIN 2.9 (L) 01/22/2021   BILITOT 0.3 01/22/2021   ALKPHOS 146 (H) 01/22/2021   AST 28 01/22/2021   ALT 50 (H) 01/22/2021   ANIONGAP 7 01/22/2021    CBG (last 3)  No results for input(s): GLUCAP in the last 72 hours.   GFR: CrCl cannot be calculated (This lab value cannot be used to calculate CrCl because it is not a number: <0.30).  Coagulation Profile: No results for input(s): INR, PROTIME in the last 168 hours.  Recent Results (from the past 240 hour(s))  Resp Panel by RT-PCR (Flu A&B, Covid) Nasopharyngeal Swab     Status: None   Collection Time: 01/14/21  2:09 PM   Specimen: Nasopharyngeal Swab; Nasopharyngeal(NP) swabs in vial transport medium  Result Value Ref Range Status   SARS Coronavirus 2 by RT PCR NEGATIVE NEGATIVE Final    Comment: (NOTE) SARS-CoV-2 target nucleic acids are NOT DETECTED.  The SARS-CoV-2 RNA is generally detectable in upper respiratory specimens during the acute phase of infection. The lowest concentration of SARS-CoV-2 viral copies this assay can detect is 138 copies/mL. A negative result does not preclude SARS-Cov-2 infection and should not be used as the sole basis for  treatment or other patient management decisions. A negative result may occur with  improper specimen collection/handling, submission of specimen other than nasopharyngeal swab, presence of viral mutation(s) within the areas targeted by this assay, and inadequate number of viral copies(<138 copies/mL). A negative result must be combined with clinical observations, patient history, and epidemiological information. The expected result is Negative.  Fact Sheet for Patients:  EntrepreneurPulse.com.au  Fact Sheet for Healthcare Providers:  IncredibleEmployment.be  This test is no t yet approved or cleared by the [place] FDA and  has been authorized for detection and/or diagnosis of SARS-CoV-2 by FDA under an Emergency Use Authorization (EUA). This EUA will remain  in effect (meaning this test can be used) for the duration of the COVID-19 declaration under Section 564(b)(1) of the Act, 21 U.S.C.section 360bbb-3(b)(1), unless the authorization is terminated  or revoked sooner.       Influenza A by PCR NEGATIVE NEGATIVE Final   Influenza B by PCR NEGATIVE NEGATIVE Final    Comment: (NOTE) The Xpert Xpress SARS-CoV-2/FLU/RSV plus assay is intended as an aid in the diagnosis of influenza from Nasopharyngeal swab specimens and should not be used as a sole basis for treatment. Nasal washings and aspirates are unacceptable  for Xpert Xpress SARS-CoV-2/FLU/RSV testing.  Fact Sheet for Patients: EntrepreneurPulse.com.au  Fact Sheet for Healthcare Providers: IncredibleEmployment.be  This test is not yet approved or cleared by the [place] FDA and has been authorized for detection and/or diagnosis of SARS-CoV-2 by FDA under an Emergency Use Authorization (EUA). This EUA will remain in effect (meaning this test can be used) for the duration of the COVID-19 declaration under Section 564(b)(1) of the Act, 21  U.S.C. section 360bbb-3(b)(1), unless the authorization is terminated or revoked.  Performed at [facility], [address], [place], [place] [address]   Urine Culture     Status: Abnormal   Collection Time: 01/19/21  6:29 PM   Specimen: In/Out Cath Urine  Result Value Ref Range Status   Specimen Description   Final    IN/OUT CATH URINE Performed at [facility], [place] [address] [place], [place] [address]    Special Requests   Final    Immunocompromised Performed at [facility], [place] [address], [place], [place] [address]    Culture MULTIPLE SPECIES PRESENT, SUGGEST RECOLLECTION (A)  Final   Report Status 01/22/2021 FINAL  Final        Radiology Studies: MR BRAIN W CONTRAST  Result Date: 01/21/2021 CLINICAL DATA:  Hematologic malignancy, assess treatment response; assess for shrinkage of CNS lymphoma EXAM: MRI HEAD WITH CONTRAST TECHNIQUE: Multiplanar, multiecho pulse sequences of the brain and surrounding structures were obtained with intravenous contrast. CONTRAST:  52mL GADAVIST GADOBUTROL 1 MMOL/ML IV SOLN COMPARISON:  01/15/2021 FINDINGS: Brain: Post contrast imaging demonstrates substantially reduced enhancement of the left posterior temporal lesion. Enhancement associated with the left smaller left parietal lesion has essentially resolved. No new mass or abnormal enhancement. Vascular: Unremarkable. Skull and upper cervical spine: Unremarkable. Sinuses/Orbits: No new abnormality. Other: None. IMPRESSION: Significantly reduced enhancement associated with left temporal and parietal lesions. Together with findings on recent noncontrast study, this reflects positive treatment response. Electronically Signed   By: Macy Mis M.D.   On: 01/21/2021 09:45        Scheduled Meds:  acyclovir  200 mg Oral Daily   Chlorhexidine Gluconate Cloth  6 each Topical Daily   enoxaparin (LOVENOX) injection  40 mg Subcutaneous Q24H    OLANZapine zydis  5 mg Oral QHS   Continuous Infusions:   LOS: 8 days     Cordelia Poche, MD [facility] 01/22/2021, 12:30 PM  If 7PM-7AM, please contact night-coverage [email or web address]

## 2021-01-22 NOTE — Plan of Care (Signed)
  Problem: Activity: Goal: Risk for activity intolerance will decrease Outcome: Progressing   Problem: Nutrition: Goal: Adequate nutrition will be maintained Outcome: Progressing   Problem: Coping: Goal: Level of anxiety will decrease Outcome: Progressing   Problem: Pain Managment: Goal: General experience of comfort will improve Outcome: Progressing   

## 2021-01-22 NOTE — Progress Notes (Signed)
Ms. Hitsman is still doing nicely.  The MRI of the brain with contrast showed that one of the lesions have resolved and the other is significantly smaller.  She is eating well.  She enjoys having her family with her.  Her daughter came up from [place].  She is walking.  She is having no problems with pain.  She has had no issues with bowels or bladder.  She has had no fever.  Her white cell count is elevated but hopefully this is just from the Neulasta that she received.  Her labs show a sodium of 131.  Potassium 3.9.  BUN 16 creatinine less than 0.3.  Calcium 8.9 with an albumin of 2.9.  LDH is 494.  Her white cell count is 31.8.  Hemoglobin 9.9.  Platelet count 217,000.  Her vital signs show temperature 97.7.  Pulse 98.  Blood pressure 116/75.  Her oral exam shows no mucositis.  Her lungs are clear bilaterally.  Cardiac exam regular rate and rhythm.  Abdomen is soft.  Neurological exam is nonfocal.  Extremity shows good strength in upper and lower extremities.  I would not think she needs to be on seizure medication.  Although she ever had a seizure.  The tumors are shrinking of the brain so I really do not think that seizure medication is really going to be helpful at this point.  I want to try to treat her inpatient for chemotherapy.  I would like to try to get her upstairs to the 6 floor.  I would like to try to get started with treatment next week.  I think she is healthy enough that we can do this.  She has had a very nice response which is wonderful to see.  I am glad that her mental status is close to her baseline now.  I know that the staff on 5 E. have done a fantastic job with her.  She is very happy with the staff.  She really likes the nurses and techs.  [name], MD  Psalm 9:1

## 2021-01-23 DIAGNOSIS — G9341 Metabolic encephalopathy: Secondary | ICD-10-CM | POA: Diagnosis not present

## 2021-01-23 DIAGNOSIS — C8510 Unspecified B-cell lymphoma, unspecified site: Secondary | ICD-10-CM | POA: Diagnosis not present

## 2021-01-23 DIAGNOSIS — E785 Hyperlipidemia, unspecified: Secondary | ICD-10-CM | POA: Diagnosis not present

## 2021-01-23 DIAGNOSIS — C7931 Secondary malignant neoplasm of brain: Secondary | ICD-10-CM | POA: Diagnosis not present

## 2021-01-23 LAB — CBC WITH DIFFERENTIAL/PLATELET
Abs Immature Granulocytes: 7.22 10*3/uL — ABNORMAL HIGH (ref 0.00–0.07)
Basophils Absolute: 0.1 10*3/uL (ref 0.0–0.1)
Basophils Relative: 0 %
Eosinophils Absolute: 0 10*3/uL (ref 0.0–0.5)
Eosinophils Relative: 0 %
HCT: 31.4 % — ABNORMAL LOW (ref 36.0–46.0)
Hemoglobin: 9.9 g/dL — ABNORMAL LOW (ref 12.0–15.0)
Immature Granulocytes: 24 %
Lymphocytes Relative: 15 %
Lymphs Abs: 4.3 10*3/uL — ABNORMAL HIGH (ref 0.7–4.0)
MCH: 24.9 pg — ABNORMAL LOW (ref 26.0–34.0)
MCHC: 31.5 g/dL (ref 30.0–36.0)
MCV: 78.9 fL — ABNORMAL LOW (ref 80.0–100.0)
Monocytes Absolute: 1.5 10*3/uL — ABNORMAL HIGH (ref 0.1–1.0)
Monocytes Relative: 5 %
Neutro Abs: 16.4 10*3/uL — ABNORMAL HIGH (ref 1.7–7.7)
Neutrophils Relative %: 56 %
Platelets: 278 10*3/uL (ref 150–400)
RBC: 3.98 MIL/uL (ref 3.87–5.11)
RDW: 21.1 % — ABNORMAL HIGH (ref 11.5–15.5)
WBC: 29.6 10*3/uL — ABNORMAL HIGH (ref 4.0–10.5)
nRBC: 1.1 % — ABNORMAL HIGH (ref 0.0–0.2)

## 2021-01-23 LAB — SODIUM, URINE, RANDOM: Sodium, Ur: 15 mmol/L

## 2021-01-23 LAB — COMPREHENSIVE METABOLIC PANEL
ALT: 58 U/L — ABNORMAL HIGH (ref 0–44)
AST: 36 U/L (ref 15–41)
Albumin: 3 g/dL — ABNORMAL LOW (ref 3.5–5.0)
Alkaline Phosphatase: 139 U/L — ABNORMAL HIGH (ref 38–126)
Anion gap: 5 (ref 5–15)
BUN: 23 mg/dL (ref 8–23)
CO2: 30 mmol/L (ref 22–32)
Calcium: 8.5 mg/dL — ABNORMAL LOW (ref 8.9–10.3)
Chloride: 94 mmol/L — ABNORMAL LOW (ref 98–111)
Creatinine, Ser: 0.39 mg/dL — ABNORMAL LOW (ref 0.44–1.00)
GFR, Estimated: >60 mL/min (ref >60)
Glucose, Bld: 124 mg/dL — ABNORMAL HIGH (ref 70–99)
Potassium: 3.9 mmol/L (ref 3.5–5.1)
Sodium: 129 mmol/L — ABNORMAL LOW (ref 135–145)
Total Bilirubin: 0.5 mg/dL (ref 0.3–1.2)
Total Protein: 5.9 g/dL — ABNORMAL LOW (ref 6.5–8.1)

## 2021-01-23 LAB — URINE CULTURE: Culture: 10000 — AB

## 2021-01-23 LAB — LACTATE DEHYDROGENASE: LDH: 495 U/L — ABNORMAL HIGH (ref 98–192)

## 2021-01-23 NOTE — Progress Notes (Signed)
PROGRESS NOTE    Ariel [name]  GDJ:[id number] DOB: 1955/04/05 DOA: 01/14/2021 PCP: [name], NP   Brief Narrative: Ariel [name] is a 65 y.o. female with history of metastatic B-cell lymphoma with brain mets who presented from home due to progressively worsening altered mental status.  Patient was started on Decadron 12 weeks ago and has been having decline of her mentation.  On admission, she was on Decadron 5 mg twice daily.  She presented with severe behavioral changes and aggressive behavior at home.  She was brought into the ED for further evaluation.  Upon presentation to the ED she was agitated and received a dose of Zyprexa with improvement.  EDP contacted Dr. Marin Olp, her medical oncologist, who recommended admission and observe for 48 hours.  When she was evaluated, her mental status was better, but she seemed to be unable to focus.  She denied headache, chest pain or abdominal pain but was complaining of lower back pain.  Denied rhinorrhea, sore throat, productive cough, wheezing or hemoptysis.  No lower extremity edema.  No dysuria, frequency or hematuria.  No polyuria, polydipsia, polyphagia or blurred vision.  Given concern for CNS lymphoma, MRI brain with contrast ordered by medical oncology which was reassuring for improvement.   Assessment & Plan:   Principal Problem:   AMS (altered mental status) Active Problems:   Hyperlipidemia   Iron deficiency anemia   Hyponatremia   Brain metastasis (HCC)   Acute metabolic encephalopathy   High grade B-cell lymphoma (HCC)   Neutropenia (HCC)   Acute toxic/metabolic encephalopathy Mostly resolved. Likely related to steroid use/acute illness/poor sleep. Still with some residual confusion  Hyponatremia Mild. Improved and now stable. -Urine sodium/osmolality  High grade B-cell lymphoma Hematology/oncology consulted. Repeat head CT/MRI suggests improving metastatic lesions with treatment. On steroids, which have been  decreased secondary to concern for steroid induced psychosis. -Heme/onc recommendations: plan for chemotherapy on 11/28, Decadron 2 mg daily  Neutropenia Resolved with Neulasta. -Continue acyclovir/Cipro  Leukocytosis Likely related to Neulasta. No evidence of infection. Appears to have peaked and WBC is now trending down.  Iron deficiency anemia Iron panel consistent with chronic disease. Hemoglobin stable.  Hyperlipidemia Appears to be improved. LDL of 83. Not on medical therapy.   DVT prophylaxis: Lovenox Code Status:   Code Status: Full Code Family Communication: Son and daughter at bedside Disposition Plan: Discharge home pending hematology/oncology recommendations. Patient medically stable for discharge, otherwise.   Consultants:  Hematology oncology  Procedures:  None  Antimicrobials: Fluconazole Ciprofloxacin Acyclovir    Subjective: No concerns today.  Objective: Vitals:   01/22/21 1356 01/22/21 2124 01/23/21 0300 01/23/21 1408  BP: 126/71 124/62 132/65 136/70  Pulse: (!) 102 99 (!) 104 97  Resp: 20 18 16 15   Temp: (!) 97.3 F (36.3 C) 99.1 F (37.3 C) 98 F (36.7 C) 98.1 F (36.7 C)  TempSrc: Oral Oral Oral Oral  SpO2: 100% 97% 98% 97%    Intake/Output Summary (Last 24 hours) at 01/23/2021 1412 Last data filed at 01/22/2021 1700 Gross per 24 hour  Intake 361 ml  Output --  Net 361 ml    There were no vitals filed for this visit.  Examination:  General exam: Appears calm and comfortable Respiratory system: Respiratory effort normal. Gastrointestinal system: Abdomen is non-distended Central nervous system: Alert and oriented Musculoskeletal: No edema. Psychiatry: Judgement and insight appear normal. Mood & affect appropriate.     Data Reviewed: I have personally reviewed following labs and imaging studies  CBC Lab Results  Component Value Date   WBC 29.6 (H) 01/23/2021   RBC 3.98 01/23/2021   HGB 9.9 (L) 01/23/2021   HCT 31.4  (L) 01/23/2021   MCV 78.9 (L) 01/23/2021   MCH 24.9 (L) 01/23/2021   PLT 278 01/23/2021   MCHC 31.5 01/23/2021   RDW 21.1 (H) 01/23/2021   LYMPHSABS 4.3 (H) 01/23/2021   MONOABS 1.5 (H) 01/23/2021   EOSABS 0.0 01/23/2021   BASOSABS 0.1 63/02/6008     Last metabolic panel Lab Results  Component Value Date   NA 129 (L) 01/23/2021   K 3.9 01/23/2021   CL 94 (L) 01/23/2021   CO2 30 01/23/2021   BUN 23 01/23/2021   CREATININE 0.39 (L) 01/23/2021   GLUCOSE 124 (H) 01/23/2021   GFRNONAA >60 01/23/2021   GFRAA >60 09/30/2019   CALCIUM 8.5 (L) 01/23/2021   PHOS 2.1 (L) 01/21/2021   PROT 5.9 (L) 01/23/2021   ALBUMIN 3.0 (L) 01/23/2021   BILITOT 0.5 01/23/2021   ALKPHOS 139 (H) 01/23/2021   AST 36 01/23/2021   ALT 58 (H) 01/23/2021   ANIONGAP 5 01/23/2021    CBG (last 3)  No results for input(s): GLUCAP in the last 72 hours.   GFR: CrCl cannot be calculated (Unknown ideal weight.).  Coagulation Profile: No results for input(s): INR, PROTIME in the last 168 hours.  Recent Results (from the past 240 hour(s))  Resp Panel by RT-PCR (Flu A&B, Covid) Nasopharyngeal Swab     Status: None   Collection Time: 01/14/21  2:09 PM   Specimen: Nasopharyngeal Swab; Nasopharyngeal(NP) swabs in vial transport medium  Result Value Ref Range Status   SARS Coronavirus 2 by RT PCR NEGATIVE NEGATIVE Final    Comment: (NOTE) SARS-CoV-2 target nucleic acids are NOT DETECTED.  The SARS-CoV-2 RNA is generally detectable in upper respiratory specimens during the acute phase of infection. The lowest concentration of SARS-CoV-2 viral copies this assay can detect is 138 copies/mL. A negative result does not preclude SARS-Cov-2 infection and should not be used as the sole basis for treatment or other patient management decisions. A negative result may occur with  improper specimen collection/handling, submission of specimen other than nasopharyngeal swab, presence of viral mutation(s) within  the areas targeted by this assay, and inadequate number of viral copies(<138 copies/mL). A negative result must be combined with clinical observations, patient history, and epidemiological information. The expected result is Negative.  Fact Sheet for Patients:  EntrepreneurPulse.com.au  Fact Sheet for Healthcare Providers:  IncredibleEmployment.be  This test is no t yet approved or cleared by the [place] FDA and  has been authorized for detection and/or diagnosis of SARS-CoV-2 by FDA under an Emergency Use Authorization (EUA). This EUA will remain  in effect (meaning this test can be used) for the duration of the COVID-19 declaration under Section 564(b)(1) of the Act, 21 U.S.C.section 360bbb-3(b)(1), unless the authorization is terminated  or revoked sooner.       Influenza A by PCR NEGATIVE NEGATIVE Final   Influenza B by PCR NEGATIVE NEGATIVE Final    Comment: (NOTE) The Xpert Xpress SARS-CoV-2/FLU/RSV plus assay is intended as an aid in the diagnosis of influenza from Nasopharyngeal swab specimens and should not be used as a sole basis for treatment. Nasal washings and aspirates are unacceptable for Xpert Xpress SARS-CoV-2/FLU/RSV testing.  Fact Sheet for Patients: EntrepreneurPulse.com.au  Fact Sheet for Healthcare Providers: IncredibleEmployment.be  This test is not yet approved or cleared by the [place] FDA  and has been authorized for detection and/or diagnosis of SARS-CoV-2 by FDA under an Emergency Use Authorization (EUA). This EUA will remain in effect (meaning this test can be used) for the duration of the COVID-19 declaration under Section 564(b)(1) of the Act, 21 U.S.C. section 360bbb-3(b)(1), unless the authorization is terminated or revoked.  Performed at [facility], [address] [place], [place] [address]   Urine Culture     Status: Abnormal   Collection  Time: 01/19/21  6:29 PM   Specimen: In/Out Cath Urine  Result Value Ref Range Status   Specimen Description   Final    IN/OUT CATH URINE Performed at [facility], [place] [address], [place], [place] [address]    Special Requests   Final    Immunocompromised Performed at [facility], [place] [address] [place], [place] [address]    Culture MULTIPLE SPECIES PRESENT, SUGGEST RECOLLECTION (A)  Final   Report Status 01/22/2021 FINAL  Final  Urine Culture     Status: Abnormal   Collection Time: 01/21/21  8:40 PM   Specimen: In/Out Cath Urine  Result Value Ref Range Status   Specimen Description   Final    IN/OUT CATH URINE Performed at [place] [address], [place], [place] [address]    Special Requests   Final    Immunocompromised Performed at [facility] [place] [address] [place], [place] [address]    Culture (A)  Final    10,000 COLONIES/mL GROUP B STREP(S.AGALACTIAE)ISOLATED TESTING AGAINST S. AGALACTIAE NOT ROUTINELY PERFORMED DUE TO PREDICTABILITY OF AMP/PEN/VAN SUSCEPTIBILITY. Performed at [facility], [place] [address], [place],  [address]    Report Status 01/23/2021 FINAL  Final        Radiology Studies: No results found.      Scheduled Meds:  acyclovir  200 mg Oral Daily   Chlorhexidine Gluconate Cloth  6 each Topical Daily   enoxaparin (LOVENOX) injection  40 mg Subcutaneous Q24H   OLANZapine zydis  5 mg Oral QHS   Continuous Infusions:   LOS: 9 days     Cordelia Poche, MD [facility] 01/23/2021, 2:12 PM  If 7PM-7AM, please contact night-coverage [email or web address]

## 2021-01-24 ENCOUNTER — Inpatient Hospital Stay: Payer: Medicare Other

## 2021-01-24 ENCOUNTER — Encounter: Payer: Self-pay | Admitting: *Deleted

## 2021-01-24 ENCOUNTER — Inpatient Hospital Stay: Payer: Medicare Other | Admitting: Hematology & Oncology

## 2021-01-24 ENCOUNTER — Encounter (HOSPITAL_COMMUNITY): Payer: Self-pay | Admitting: Internal Medicine

## 2021-01-24 DIAGNOSIS — C7931 Secondary malignant neoplasm of brain: Secondary | ICD-10-CM | POA: Diagnosis not present

## 2021-01-24 DIAGNOSIS — G9341 Metabolic encephalopathy: Secondary | ICD-10-CM | POA: Diagnosis not present

## 2021-01-24 DIAGNOSIS — E785 Hyperlipidemia, unspecified: Secondary | ICD-10-CM | POA: Diagnosis not present

## 2021-01-24 DIAGNOSIS — R400 Somnolence: Secondary | ICD-10-CM | POA: Diagnosis not present

## 2021-01-24 DIAGNOSIS — C8510 Unspecified B-cell lymphoma, unspecified site: Secondary | ICD-10-CM | POA: Diagnosis not present

## 2021-01-24 LAB — LACTATE DEHYDROGENASE: LDH: 426 U/L — ABNORMAL HIGH (ref 98–192)

## 2021-01-24 LAB — COMPREHENSIVE METABOLIC PANEL
ALT: 53 U/L — ABNORMAL HIGH (ref 0–44)
AST: 30 U/L (ref 15–41)
Albumin: 3 g/dL — ABNORMAL LOW (ref 3.5–5.0)
Alkaline Phosphatase: 115 U/L (ref 38–126)
Anion gap: 7 (ref 5–15)
BUN: 19 mg/dL (ref 8–23)
CO2: 29 mmol/L (ref 22–32)
Calcium: 8.7 mg/dL — ABNORMAL LOW (ref 8.9–10.3)
Chloride: 96 mmol/L — ABNORMAL LOW (ref 98–111)
Creatinine, Ser: 0.43 mg/dL — ABNORMAL LOW (ref 0.44–1.00)
GFR, Estimated: >60 mL/min (ref >60)
Glucose, Bld: 123 mg/dL — ABNORMAL HIGH (ref 70–99)
Potassium: 3.6 mmol/L (ref 3.5–5.1)
Sodium: 132 mmol/L — ABNORMAL LOW (ref 135–145)
Total Bilirubin: 0.5 mg/dL (ref 0.3–1.2)
Total Protein: 5.5 g/dL — ABNORMAL LOW (ref 6.5–8.1)

## 2021-01-24 LAB — CBC WITH DIFFERENTIAL/PLATELET
Abs Immature Granulocytes: 3.91 10*3/uL — ABNORMAL HIGH (ref 0.00–0.07)
Basophils Absolute: 0.2 10*3/uL — ABNORMAL HIGH (ref 0.0–0.1)
Basophils Relative: 1 %
Eosinophils Absolute: 0 10*3/uL (ref 0.0–0.5)
Eosinophils Relative: 0 %
HCT: 29.8 % — ABNORMAL LOW (ref 36.0–46.0)
Hemoglobin: 9.5 g/dL — ABNORMAL LOW (ref 12.0–15.0)
Immature Granulocytes: 16 %
Lymphocytes Relative: 14 %
Lymphs Abs: 3.3 10*3/uL (ref 0.7–4.0)
MCH: 25.5 pg — ABNORMAL LOW (ref 26.0–34.0)
MCHC: 31.9 g/dL (ref 30.0–36.0)
MCV: 79.9 fL — ABNORMAL LOW (ref 80.0–100.0)
Monocytes Absolute: 1 10*3/uL (ref 0.1–1.0)
Monocytes Relative: 4 %
Neutro Abs: 15.7 10*3/uL — ABNORMAL HIGH (ref 1.7–7.7)
Neutrophils Relative %: 65 %
Platelets: 258 10*3/uL (ref 150–400)
RBC: 3.73 MIL/uL — ABNORMAL LOW (ref 3.87–5.11)
RDW: 21.3 % — ABNORMAL HIGH (ref 11.5–15.5)
WBC: 24 10*3/uL — ABNORMAL HIGH (ref 4.0–10.5)
nRBC: 0.9 % — ABNORMAL HIGH (ref 0.0–0.2)

## 2021-01-24 LAB — OSMOLALITY, URINE: Osmolality, Ur: 513 mOsm/kg (ref 300–900)

## 2021-01-24 NOTE — Progress Notes (Signed)
PROGRESS NOTE    Ariel [name]  XTK:[id number] DOB: December 04, 1955 DOA: 01/14/2021 PCP: [name], NP   Brief Narrative: Ariel [name] is a 65 y.o. female with history of metastatic B-cell lymphoma with brain mets who presented from home due to progressively worsening altered mental status.  Patient was started on Decadron 12 weeks ago and has been having decline of her mentation.  On admission, she was on Decadron 5 mg twice daily.  She presented with severe behavioral changes and aggressive behavior at home.  She was brought into the ED for further evaluation.  Upon presentation to the ED she was agitated and received a dose of Zyprexa with improvement.  EDP contacted Dr. Marin Olp, her medical oncologist, who recommended admission and observe for 48 hours.  When she was evaluated, her mental status was better, but she seemed to be unable to focus.  She denied headache, chest pain or abdominal pain but was complaining of lower back pain.  Denied rhinorrhea, sore throat, productive cough, wheezing or hemoptysis.  No lower extremity edema.  No dysuria, frequency or hematuria.  No polyuria, polydipsia, polyphagia or blurred vision.  Given concern for CNS lymphoma, MRI brain with contrast ordered by medical oncology which was reassuring for improvement.   Assessment & Plan:   Principal Problem:   AMS (altered mental status) Active Problems:   Hyperlipidemia   Iron deficiency anemia   Hyponatremia   Brain metastasis (HCC)   Acute metabolic encephalopathy   High grade B-cell lymphoma (HCC)   Neutropenia (HCC)   Acute toxic/metabolic encephalopathy Mostly resolved. Likely related to steroid use/acute illness/poor sleep. Still with some residual confusion per family but significantly improved.  Hyponatremia Mild. Improved and now stable.  High grade B-cell lymphoma Hematology/oncology consulted. Repeat head CT/MRI suggests improving metastatic lesions with treatment. On steroids, which  have been decreased secondary to concern for steroid induced psychosis. Continue Decadron. Oncology planning chemotherapy while inpatient.  Neutropenia Resolved with Neulasta. Continue acyclovir per oncology  Leukocytosis Likely related to Neulasta. No evidence of infection. Appears to have peaked and WBC is now trending down.  Iron deficiency anemia Iron panel consistent with chronic disease. Hemoglobin stable.  Hyperlipidemia Appears to be improved. LDL of 83. Not on medical therapy.  Patient transferred to Dr. Antonieta Pert service.    DVT prophylaxis: Lovenox Code Status:   Code Status: Full Code Family Communication: Daughter at bedside Disposition Plan: Per oncology service   Consultants:  Hematology oncology  Procedures:  None  Antimicrobials: Fluconazole Ciprofloxacin Acyclovir    Subjective: Some ankle swelling. No other concerns.  Objective: Vitals:   01/24/21 0357 01/24/21 0837 01/24/21 1304 01/24/21 1405  BP: (!) 126/56  (!) 125/56 (!) 157/73  Pulse: 97  92 91  Resp: 20  18 16   Temp: 98.1 F (36.7 C)  98.3 F (36.8 C) 98 F (36.7 C)  TempSrc: Oral  Oral Oral  SpO2: 96%  100% 99%  Weight:  63 kg      Intake/Output Summary (Last 24 hours) at 01/24/2021 1558 Last data filed at 01/24/2021 9924 Gross per 24 hour  Intake 480 ml  Output --  Net 480 ml    Filed Weights   01/24/21 0837  Weight: 63 kg    Examination:  General exam: Appears calm and comfortable Respiratory system: Respiratory effort normal. Gastrointestinal system: Abdomen is non-distended Central nervous system: Alert and oriented. Musculoskeletal: bilateral ankle edema Skin: No cyanosis. No rashes Psychiatry: Judgement and insight appear normal. Mood & affect appropriate.  Data Reviewed: I have personally reviewed following labs and imaging studies  CBC Lab Results  Component Value Date   WBC 24.0 (H) 01/24/2021   RBC 3.73 (L) 01/24/2021   HGB 9.5 (L) 01/24/2021    HCT 29.8 (L) 01/24/2021   MCV 79.9 (L) 01/24/2021   MCH 25.5 (L) 01/24/2021   PLT 258 01/24/2021   MCHC 31.9 01/24/2021   RDW 21.3 (H) 01/24/2021   LYMPHSABS 3.3 01/24/2021   MONOABS 1.0 01/24/2021   EOSABS 0.0 01/24/2021   BASOSABS 0.2 (H) 46/65/9935     Last metabolic panel Lab Results  Component Value Date   NA 132 (L) 01/24/2021   K 3.6 01/24/2021   CL 96 (L) 01/24/2021   CO2 29 01/24/2021   BUN 19 01/24/2021   CREATININE 0.43 (L) 01/24/2021   GLUCOSE 123 (H) 01/24/2021   GFRNONAA >60 01/24/2021   GFRAA >60 09/30/2019   CALCIUM 8.7 (L) 01/24/2021   PHOS 2.1 (L) 01/21/2021   PROT 5.5 (L) 01/24/2021   ALBUMIN 3.0 (L) 01/24/2021   BILITOT 0.5 01/24/2021   ALKPHOS 115 01/24/2021   AST 30 01/24/2021   ALT 53 (H) 01/24/2021   ANIONGAP 7 01/24/2021    CBG (last 3)  No results for input(s): GLUCAP in the last 72 hours.   GFR: Estimated Creatinine Clearance: 64.8 mL/min (A) (by C-G formula based on SCr of 0.43 mg/dL (L)).  Coagulation Profile: No results for input(s): INR, PROTIME in the last 168 hours.  Recent Results (from the past 240 hour(s))  Urine Culture     Status: Abnormal   Collection Time: 01/19/21  6:29 PM   Specimen: In/Out Cath Urine  Result Value Ref Range Status   Specimen Description   Final    IN/OUT CATH URINE Performed at [place] [address], [place], [place] [address]    Special Requests   Final    Immunocompromised Performed at [facility], [place] [address] [place], [place] [address]    Culture MULTIPLE SPECIES PRESENT, SUGGEST RECOLLECTION (A)  Final   Report Status 01/22/2021 FINAL  Final  Urine Culture     Status: Abnormal   Collection Time: 01/21/21  8:40 PM   Specimen: In/Out Cath Urine  Result Value Ref Range Status   Specimen Description   Final    IN/OUT CATH URINE Performed at [facility] [address] [place], [place] [address]    Special Requests    Final    Immunocompromised Performed at [facility], [place] [address], [place], [place] [address]    Culture (A)  Final    10,000 COLONIES/mL GROUP B STREP(S.AGALACTIAE)ISOLATED TESTING AGAINST S. AGALACTIAE NOT ROUTINELY PERFORMED DUE TO PREDICTABILITY OF AMP/PEN/VAN SUSCEPTIBILITY. Performed at [facility], [place] [address] [place], [place] [address]    Report Status 01/23/2021 FINAL  Final        Radiology Studies: No results found.      Scheduled Meds:  acyclovir  200 mg Oral Daily   Chlorhexidine Gluconate Cloth  6 each Topical Daily   enoxaparin (LOVENOX) injection  40 mg Subcutaneous Q24H   OLANZapine zydis  5 mg Oral QHS   Continuous Infusions:   LOS: 10 days     Cordelia Poche, MD [facility] 01/24/2021, 3:58 PM  If 7PM-7AM, please contact night-coverage [email or web address]

## 2021-01-24 NOTE — Progress Notes (Signed)
Patient was scheduled to receive her next cycle of [facility] outpatient today however she is still admitted to the hospital. Dr Marin Olp plans to administer a new regimen while admitted, R-MTV. Will follow for post discharge needs and follow up.  Oncology Nurse Navigator Documentation  Oncology Nurse Navigator Flowsheets 01/24/2021  Abnormal Finding Date -  Confirmed Diagnosis Date -  Phase of Treatment -  Chemotherapy Actual Start Date: -  Navigator Follow Up Date: 01/31/2021  Navigator Follow Up Reason: Appointment Review  Navigator Location [facility]  Navigator Encounter Type Appt/Treatment Plan Review  Telephone -  Treatment Initiated Date -  Patient Visit Type MedOnc  Treatment Phase Active Tx  Barriers/Navigation Needs Coordination of Care;Education  Education -  Interventions None Required  Acuity Level 2-Minimal Needs (1-2 Barriers Identified)  Referrals -  Coordination of Care -  Education Method -  Support Groups/Services Friends and Family  Time Spent with Patient 15

## 2021-01-24 NOTE — Progress Notes (Signed)
Ms. Haverstick is doing great.  She will hopefully be sent up to 6 E. today.  I would like to try to get treatment started on her tomorrow.  While she is on 6 E., I will be more than happy to be the attending.  I want to try her on chemotherapy with R-MTV.  This is a high-dose methotrexate based regimen.  It will certainly help with what is in her brain and also will help with systemic therapy.  Her labs show white cell count of 24.  Hemoglobin 9.5.  Platelet count 258,000.  Her BUN is 19 creatinine 0.43.  Her sodium is 132 with a potassium of 3.6.  Her albumin is 3.0.  Her appetite is doing okay.  She is having no nausea or vomiting.  Her LDH is 426.  She has had no fever.  She has had no nausea or vomiting.  There is no change in bowel or bladder habits.  Her vital signs show temperature 98.1.  Pulse 97.  Blood pressure 126/56.  Her oral exam shows no mucositis.  There is no adenopathy in the neck.  Lungs are clear.  Cardiac exam regular rate and rhythm.  Abdomen is soft.  Neurological exam is nonfocal.  Again, we want to try to get her up to 6 E.  Hopefully, we can get chemotherapy started on her in 1-2 days.  I know that she has gotten incredible care from all staff up on 5 E.  I do appreciate all their efforts.  [name], MD  Hebrews 11:1

## 2021-01-25 ENCOUNTER — Inpatient Hospital Stay: Payer: Medicare Other

## 2021-01-25 DIAGNOSIS — R400 Somnolence: Secondary | ICD-10-CM | POA: Diagnosis not present

## 2021-01-25 LAB — CBC WITH DIFFERENTIAL/PLATELET
Abs Immature Granulocytes: 2.25 10*3/uL — ABNORMAL HIGH (ref 0.00–0.07)
Basophils Absolute: 0.2 10*3/uL — ABNORMAL HIGH (ref 0.0–0.1)
Basophils Relative: 1 %
Eosinophils Absolute: 0 10*3/uL (ref 0.0–0.5)
Eosinophils Relative: 0 %
HCT: 30.5 % — ABNORMAL LOW (ref 36.0–46.0)
Hemoglobin: 9.6 g/dL — ABNORMAL LOW (ref 12.0–15.0)
Immature Granulocytes: 10 %
Lymphocytes Relative: 18 %
Lymphs Abs: 4.1 10*3/uL — ABNORMAL HIGH (ref 0.7–4.0)
MCH: 25.4 pg — ABNORMAL LOW (ref 26.0–34.0)
MCHC: 31.5 g/dL (ref 30.0–36.0)
MCV: 80.7 fL (ref 80.0–100.0)
Monocytes Absolute: 1.2 10*3/uL — ABNORMAL HIGH (ref 0.1–1.0)
Monocytes Relative: 5 %
Neutro Abs: 15 10*3/uL — ABNORMAL HIGH (ref 1.7–7.7)
Neutrophils Relative %: 66 %
Platelets: 305 10*3/uL (ref 150–400)
RBC: 3.78 MIL/uL — ABNORMAL LOW (ref 3.87–5.11)
RDW: 22.2 % — ABNORMAL HIGH (ref 11.5–15.5)
WBC: 22.7 10*3/uL — ABNORMAL HIGH (ref 4.0–10.5)
nRBC: 0.7 % — ABNORMAL HIGH (ref 0.0–0.2)

## 2021-01-25 LAB — COMPREHENSIVE METABOLIC PANEL
ALT: 59 U/L — ABNORMAL HIGH (ref 0–44)
AST: 35 U/L (ref 15–41)
Albumin: 3.2 g/dL — ABNORMAL LOW (ref 3.5–5.0)
Alkaline Phosphatase: 123 U/L (ref 38–126)
Anion gap: 6 (ref 5–15)
BUN: 19 mg/dL (ref 8–23)
CO2: 30 mmol/L (ref 22–32)
Calcium: 8.9 mg/dL (ref 8.9–10.3)
Chloride: 94 mmol/L — ABNORMAL LOW (ref 98–111)
Creatinine, Ser: 0.45 mg/dL (ref 0.44–1.00)
GFR, Estimated: >60 mL/min (ref >60)
Glucose, Bld: 103 mg/dL — ABNORMAL HIGH (ref 70–99)
Potassium: 3.9 mmol/L (ref 3.5–5.1)
Sodium: 130 mmol/L — ABNORMAL LOW (ref 135–145)
Total Bilirubin: 0.3 mg/dL (ref 0.3–1.2)
Total Protein: 6.2 g/dL — ABNORMAL LOW (ref 6.5–8.1)

## 2021-01-25 LAB — LACTATE DEHYDROGENASE: LDH: 463 U/L — ABNORMAL HIGH (ref 98–192)

## 2021-01-25 MED ORDER — DEXTROSE 5 % IV SOLN: INTRAVENOUS | Status: DC

## 2021-01-25 MED ORDER — SODIUM BICARBONATE 8.4 % IV SOLN
INTRAVENOUS | Status: DC
  Filled 2021-01-25 (×3): qty 1000
  Filled 2021-01-25: qty 150
  Filled 2021-01-25 (×3): qty 1000
  Filled 2021-01-25 (×2): qty 150

## 2021-01-25 MED ORDER — ACETAMINOPHEN 325 MG PO TABS
650.0000 mg | ORAL_TABLET | Freq: Once | ORAL | Status: AC
  Administered 2021-01-25: 650 mg via ORAL

## 2021-01-25 MED ORDER — DIPHENHYDRAMINE HCL 50 MG/ML IJ SOLN
INTRAMUSCULAR | Status: AC
  Filled 2021-01-25: qty 1

## 2021-01-25 MED ORDER — ACYCLOVIR 400 MG PO TABS
ORAL_TABLET | ORAL | Status: AC
  Filled 2021-01-25: qty 1

## 2021-01-25 MED ORDER — SODIUM CHLORIDE 0.9 % IV SOLN
500.0000 mg/m2 | Freq: Once | INTRAVENOUS | Status: AC
  Administered 2021-01-25: 900 mg via INTRAVENOUS
  Filled 2021-01-25: qty 90

## 2021-01-25 MED ORDER — SODIUM BICARBONATE/SODIUM CHLORIDE MOUTHWASH
OROMUCOSAL | Status: DC
  Administered 2021-01-26 – 2021-02-02 (×6): 1 via OROMUCOSAL
  Filled 2021-01-25 (×3): qty 1000

## 2021-01-25 MED ORDER — DIPHENHYDRAMINE HCL 50 MG/ML IJ SOLN
25.0000 mg | Freq: Once | INTRAMUSCULAR | Status: AC
  Administered 2021-01-25: 25 mg via INTRAVENOUS

## 2021-01-25 NOTE — Care Management Important Message (Signed)
Important Message  Patient Details IM Letter given to the Patient. Name: Ariel [name] MRN: [id number] Date of Birth: [birth year]   Medicare Important Message Given:  Yes     [name] 01/25/2021, 10:28 AM

## 2021-01-25 NOTE — Progress Notes (Signed)
Ms. Derstine is now on 6 E.  She looks great.  She feels good.  We will go ahead and get started on treatment.  I am going to switch her treatments up a little bit and we will try her on Rituxan/high-dose methotrexate/vincristine/thiotepa.  This is a regimen mostly for CNS lymphoma but also will have systemic response.  Hopefully, she will start the Rituxan today.  I talked her about all of this.  We will start her on sodium bicarb mouth rinses.  Will have to get the leucovorin rescue started probably on Thursday or Friday.  Her labs show white cell count of 22.7.  Hemoglobin 9.6.  Platelet count is 305K.  Her LDH is 463.  Her BUN/creatinine is 19 and 0.45.  She is eating well.  There is no nausea or vomiting.  She has no rashes.  There is no diarrhea..  She says she did have a blood clot out of her nose last night.  Her vital signs show temperature 98.3.  Pulse 88.  Blood pressure 122/57.  Her oral exam shows no mucositis.  Lungs are clear.  Cardiac exam regular rate and rhythm.  Abdomen is soft.  Bowel sounds are present.  Extremities shows no clubbing, cyanosis or edema.  Neurological exam is nonfocal.  Again, will get started with her second cycle of chemotherapy.  I will change his chemotherapy as she may have had confusion with the ifosfamide with her prior regimen.  I know that she has fantastic care from all the staff on 6 E.  I appreciate all of their compassion.  [name], MD  Malachi 3:1

## 2021-01-26 ENCOUNTER — Ambulatory Visit: Payer: Medicare Other

## 2021-01-26 DIAGNOSIS — R400 Somnolence: Secondary | ICD-10-CM | POA: Diagnosis not present

## 2021-01-26 LAB — CBC WITH DIFFERENTIAL/PLATELET
Abs Immature Granulocytes: 1.14 10*3/uL — ABNORMAL HIGH (ref 0.00–0.07)
Basophils Absolute: 0.1 10*3/uL (ref 0.0–0.1)
Basophils Relative: 1 %
Eosinophils Absolute: 0 10*3/uL (ref 0.0–0.5)
Eosinophils Relative: 0 %
HCT: 29.2 % — ABNORMAL LOW (ref 36.0–46.0)
Hemoglobin: 9.2 g/dL — ABNORMAL LOW (ref 12.0–15.0)
Immature Granulocytes: 8 %
Lymphocytes Relative: 20 %
Lymphs Abs: 3 10*3/uL (ref 0.7–4.0)
MCH: 25.7 pg — ABNORMAL LOW (ref 26.0–34.0)
MCHC: 31.5 g/dL (ref 30.0–36.0)
MCV: 81.6 fL (ref 80.0–100.0)
Monocytes Absolute: 1 10*3/uL (ref 0.1–1.0)
Monocytes Relative: 7 %
Neutro Abs: 10 10*3/uL — ABNORMAL HIGH (ref 1.7–7.7)
Neutrophils Relative %: 64 %
Platelets: 291 10*3/uL (ref 150–400)
RBC: 3.58 MIL/uL — ABNORMAL LOW (ref 3.87–5.11)
RDW: 22.5 % — ABNORMAL HIGH (ref 11.5–15.5)
WBC: 15.3 10*3/uL — ABNORMAL HIGH (ref 4.0–10.5)
nRBC: 0.5 % — ABNORMAL HIGH (ref 0.0–0.2)

## 2021-01-26 LAB — COMPREHENSIVE METABOLIC PANEL
ALT: 49 U/L — ABNORMAL HIGH (ref 0–44)
AST: 27 U/L (ref 15–41)
Albumin: 2.9 g/dL — ABNORMAL LOW (ref 3.5–5.0)
Alkaline Phosphatase: 111 U/L (ref 38–126)
Anion gap: 6 (ref 5–15)
BUN: 11 mg/dL (ref 8–23)
CO2: 32 mmol/L (ref 22–32)
Calcium: 8.8 mg/dL — ABNORMAL LOW (ref 8.9–10.3)
Chloride: 95 mmol/L — ABNORMAL LOW (ref 98–111)
Creatinine, Ser: 0.39 mg/dL — ABNORMAL LOW (ref 0.44–1.00)
GFR, Estimated: >60 mL/min (ref >60)
Glucose, Bld: 133 mg/dL — ABNORMAL HIGH (ref 70–99)
Potassium: 3.6 mmol/L (ref 3.5–5.1)
Sodium: 133 mmol/L — ABNORMAL LOW (ref 135–145)
Total Bilirubin: 0.3 mg/dL (ref 0.3–1.2)
Total Protein: 5.6 g/dL — ABNORMAL LOW (ref 6.5–8.1)

## 2021-01-26 LAB — LACTATE DEHYDROGENASE: LDH: 376 U/L — ABNORMAL HIGH (ref 98–192)

## 2021-01-26 MED ORDER — HOT PACK MISC ONCOLOGY
1.0000 | Freq: Once | Status: DC | PRN
  Filled 2021-01-26: qty 1

## 2021-01-26 MED ORDER — THIOTEPA CHEMO INJECTION 15 MG
60.0000 mg | Freq: Once | INTRAVENOUS | Status: AC
  Administered 2021-01-26: 60 mg via INTRAVENOUS
  Filled 2021-01-26: qty 5.77

## 2021-01-26 MED ORDER — VINCRISTINE SULFATE CHEMO INJECTION 1 MG/ML
2.0000 mg | Freq: Once | INTRAVENOUS | Status: AC
  Administered 2021-01-26: 2 mg via INTRAVENOUS
  Filled 2021-01-26: qty 2

## 2021-01-26 MED ORDER — SODIUM CHLORIDE 0.9 % IV SOLN
Freq: Once | INTRAVENOUS | Status: AC
  Administered 2021-01-26: 8 mg via INTRAVENOUS
  Filled 2021-01-26: qty 4

## 2021-01-26 NOTE — TOC Progression Note (Signed)
Transition of Care [facility]) - Progression Note    Patient Details  Name: Ariel [name] MRN: [id number] Date of Birth: Oct 30, 1955  Transition of [facility]) CM/SW Contact  Duaa Stelzner, Juliann Pulse, RN Phone Number: 01/26/2021, 2:06 PM  Clinical Narrative: Chemo initiated today. No current bed offers for SNF.Continue to monitor for d/c plans.      Expected Discharge Plan:  (to be determined) Barriers to Discharge: Continued Medical Work up  Expected Discharge Plan and Services Expected Discharge Plan:  (to be determined)   Discharge Planning Services: CM Consult Post Acute Care Choice: [place] arrangements for the past 2 months: Single Family Home                                       Social Determinants of Health (SDOH) Interventions    Readmission Risk Interventions No flowsheet data found.

## 2021-01-26 NOTE — Progress Notes (Signed)
So far, everything is going pretty well with respect to treatment.  I think she got Rituxan yesterday.  She is on IV fluid with sodium bicarb make sure that her urine is alkaline.  She is using sodium bicarb mouth rinses.  She is eating well.  She is having no problems with nausea or vomiting.  She have no issues with bleeding.  There is no diarrhea.  She has had no rashes.  Mentally, she is really back to her baseline cognitive function.  Her white cell count 15.3.  Hemoglobin 9.2.  Platelet count 291,000.  She has had no fever.  Her vital signs are temperature of 99.  Pulse 78.  Blood pressure 136/62.  Her lungs are clear.  Cardiac exam regular rate and rhythm.  Oral exam shows no mucositis.  Abdomen is soft.  Skin exam shows no rashes.  Neurological exam is nonfocal.  I think that she will actually start the chemotherapy today.  I am unsure if she starts the methotrexate today or tomorrow.  Again we have her on IV fluid with sodium bicarb.  We are checking the urine pH.  I appreciate the outstanding care that she is getting from all the staff up on 6E.  [name], MD  Oswaldo Milian 7:14

## 2021-01-26 NOTE — Progress Notes (Signed)
Vincristine and Thiotepa dosages and calculations verified with Vikki Ports, Therapist, sports. Consent for chemotherapy administration in pt's chart. Education completed with pt at bedside. Reviewed Vincristine, Thiotepa and Methotrexate with pt. Patient handout provided as well. No questions or concerns from pt at this time.

## 2021-01-27 ENCOUNTER — Encounter (HOSPITAL_COMMUNITY): Payer: Self-pay | Admitting: Hematology & Oncology

## 2021-01-27 ENCOUNTER — Inpatient Hospital Stay: Payer: Medicare Other

## 2021-01-27 DIAGNOSIS — R400 Somnolence: Secondary | ICD-10-CM | POA: Diagnosis not present

## 2021-01-27 LAB — GLUCOSE, CAPILLARY
Glucose-Capillary: 167 mg/dL — ABNORMAL HIGH (ref 70–99)
Glucose-Capillary: 121 mg/dL — ABNORMAL HIGH (ref 70–99)
Glucose-Capillary: 145 mg/dL — ABNORMAL HIGH (ref 70–99)

## 2021-01-27 LAB — URINALYSIS, DIPSTICK ONLY
Bilirubin Urine: NEGATIVE
Glucose, UA: NEGATIVE mg/dL
Hgb urine dipstick: NEGATIVE
Ketones, ur: NEGATIVE mg/dL
Leukocytes,Ua: NEGATIVE
Nitrite: NEGATIVE
Protein, ur: 100 mg/dL — AB
Specific Gravity, Urine: 1.016 (ref 1.005–1.030)
pH: 8 (ref 5.0–8.0)

## 2021-01-27 LAB — COMPREHENSIVE METABOLIC PANEL
ALT: 55 U/L — ABNORMAL HIGH (ref 0–44)
AST: 33 U/L (ref 15–41)
Albumin: 3.4 g/dL — ABNORMAL LOW (ref 3.5–5.0)
Alkaline Phosphatase: 136 U/L — ABNORMAL HIGH (ref 38–126)
Anion gap: 7 (ref 5–15)
BUN: 14 mg/dL (ref 8–23)
CO2: 29 mmol/L (ref 22–32)
Calcium: 9 mg/dL (ref 8.9–10.3)
Chloride: 96 mmol/L — ABNORMAL LOW (ref 98–111)
Creatinine, Ser: 0.39 mg/dL — ABNORMAL LOW (ref 0.44–1.00)
GFR, Estimated: >60 mL/min (ref >60)
Glucose, Bld: 146 mg/dL — ABNORMAL HIGH (ref 70–99)
Potassium: 3.3 mmol/L — ABNORMAL LOW (ref 3.5–5.1)
Sodium: 132 mmol/L — ABNORMAL LOW (ref 135–145)
Total Bilirubin: 0.4 mg/dL (ref 0.3–1.2)
Total Protein: 6.9 g/dL (ref 6.5–8.1)

## 2021-01-27 LAB — CBC WITH DIFFERENTIAL/PLATELET
Abs Immature Granulocytes: 0.7 10*3/uL — ABNORMAL HIGH (ref 0.00–0.07)
Basophils Absolute: 0.1 10*3/uL (ref 0.0–0.1)
Basophils Relative: 0 %
Eosinophils Absolute: 0 10*3/uL (ref 0.0–0.5)
Eosinophils Relative: 0 %
HCT: 33.1 % — ABNORMAL LOW (ref 36.0–46.0)
Hemoglobin: 10.5 g/dL — ABNORMAL LOW (ref 12.0–15.0)
Immature Granulocytes: 4 %
Lymphocytes Relative: 15 %
Lymphs Abs: 2.4 10*3/uL (ref 0.7–4.0)
MCH: 25.6 pg — ABNORMAL LOW (ref 26.0–34.0)
MCHC: 31.7 g/dL (ref 30.0–36.0)
MCV: 80.7 fL (ref 80.0–100.0)
Monocytes Absolute: 1 10*3/uL (ref 0.1–1.0)
Monocytes Relative: 6 %
Neutro Abs: 11.8 10*3/uL — ABNORMAL HIGH (ref 1.7–7.7)
Neutrophils Relative %: 75 %
Platelets: 419 10*3/uL — ABNORMAL HIGH (ref 150–400)
RBC: 4.1 MIL/uL (ref 3.87–5.11)
RDW: 22.7 % — ABNORMAL HIGH (ref 11.5–15.5)
WBC: 16 10*3/uL — ABNORMAL HIGH (ref 4.0–10.5)
nRBC: 0.2 % (ref 0.0–0.2)

## 2021-01-27 LAB — IRON AND TIBC
Iron: 173 ug/dL — ABNORMAL HIGH (ref 28–170)
Saturation Ratios: 76 % — ABNORMAL HIGH (ref 10.4–31.8)
TIBC: 227 ug/dL — ABNORMAL LOW (ref 250–450)
UIBC: 54 ug/dL

## 2021-01-27 LAB — LACTATE DEHYDROGENASE: LDH: 407 U/L — ABNORMAL HIGH (ref 98–192)

## 2021-01-27 MED ORDER — SODIUM CHLORIDE 0.9 % IV SOLN: Freq: Once | INTRAVENOUS | Status: DC

## 2021-01-27 MED ORDER — SODIUM CHLORIDE 0.9 % IV SOLN
16.0000 mg | Freq: Once | INTRAVENOUS | Status: AC
  Administered 2021-01-27: 16 mg via INTRAVENOUS
  Filled 2021-01-27: qty 8

## 2021-01-27 MED ORDER — SODIUM CHLORIDE 0.9 % IV SOLN
5.5200 g/m2 | Freq: Once | INTRAVENOUS | Status: AC
  Administered 2021-01-27: 9.4393 g via INTRAVENOUS
  Filled 2021-01-27: qty 320

## 2021-01-27 MED ORDER — SODIUM CHLORIDE 0.9 % IV SOLN
1.5000 g/m2 | Freq: Once | INTRAVENOUS | Status: AC
  Administered 2021-01-27: 2.565 g via INTRAVENOUS
  Filled 2021-01-27: qty 102.6

## 2021-01-27 NOTE — Progress Notes (Signed)
Events of last night noted.  I have to believe all this confusion is from steroids.  She must not get any further Decadron.  This morning she seemed pretty alert and oriented.  Again, the steroids, I fear, are probably the source of the confusion.  She has had no nausea or vomiting.  She has had no pain.  There is no diarrhea.  She has had no cough.  Her urine is alkaline.  The urine pH is 8.  Her labs show white count 16.  Hemoglobin 10.5.  Platelet count 419,000.  Her sodium is 132.  Potassium 3.3.  Blood sugar 146.  Calcium is 9 with an albumin of 3.4.  Her vital signs show temperature of 98.  Pulse 89.  Blood pressure 118/67.  Her oral exam shows no mucositis.  Her lungs are clear.  Cardiac exam regular rate and rhythm.  Abdomen is soft.  Bowel sounds are present.  There is no guarding rebound tenderness.  Neurological exam shows no focal neurological deficits.  We are in the midst of her second cycle of treatment.  Again she has his confusion which I have to believe is from steroids.  I am stopping the Decadron.  We will proceed with the methotrexate today.  I do appreciate the wonderful care that she is getting from the staff up on [address].  She had a wonderful mental state yesterday and the day before.  Again steroids are part of the protocol and that is the only thing I can think of that would be causing this confusion.  I am thankful for the compassionate care that the staff is giving her.  Carollee Massed, MD  Lurena Joiner 1:37

## 2021-01-27 NOTE — Progress Notes (Signed)
Per MD note and verification with him we will remove all dex from careplan and give zofran only for premeds.  Md is monitoring K level and will not add add'l supplements at this point.

## 2021-01-27 NOTE — Progress Notes (Signed)
Pt with ongoing confusion this admission which seems to be somewhat worse at this point. At the start of the shift, I received bedside report and found that pt had poured water in the floor and emptied all of the trash out of the trash can. She was trying to change her brief and and a total of 4 briefs on, 2 on each leg with one leg through each brief. She had placed her shoe and her pitcher into the toilet- she said she did this because there wasn't a closet to put them in. At the same time, she is able to state where is she at and give me details about her cancer and her treatment. She has pulled at her port and talked about needing to get it out so she can wear her shirt. She has continuously gotten up out of her chair and bed and is a fall risk considering her IV pole and the items/water she repeatedly scatters all over the floor. Discussed this with the charge RN and the [facility], and decision to place sitter at bedside was agreed upon. Pt got in bed around 2300 and slept for about 3.5 hours. She has now awoken and is generally upset but cannot clearly state why. She is not receptive to attempts to explain how we want to keep her safe, and is becoming argumentative and trying to leave. She is standing up in protest, facing the wall, and says she will do this until the doctor and the manager come to see her in the morning. Sitter remains at bedside and will ensure safety. Hortencia Conradi RN

## 2021-01-28 ENCOUNTER — Inpatient Hospital Stay: Payer: Medicare Other

## 2021-01-28 ENCOUNTER — Encounter (HOSPITAL_COMMUNITY): Payer: Self-pay | Admitting: Hematology & Oncology

## 2021-01-28 DIAGNOSIS — R400 Somnolence: Secondary | ICD-10-CM | POA: Diagnosis not present

## 2021-01-28 LAB — COMPREHENSIVE METABOLIC PANEL
ALT: 50 U/L — ABNORMAL HIGH (ref 0–44)
AST: 43 U/L — ABNORMAL HIGH (ref 15–41)
Albumin: 3.3 g/dL — ABNORMAL LOW (ref 3.5–5.0)
Alkaline Phosphatase: 127 U/L — ABNORMAL HIGH (ref 38–126)
Anion gap: 10 (ref 5–15)
BUN: 15 mg/dL (ref 8–23)
CO2: 29 mmol/L (ref 22–32)
Calcium: 8.9 mg/dL (ref 8.9–10.3)
Chloride: 96 mmol/L — ABNORMAL LOW (ref 98–111)
Creatinine, Ser: 0.57 mg/dL (ref 0.44–1.00)
GFR, Estimated: >60 mL/min (ref >60)
Glucose, Bld: 121 mg/dL — ABNORMAL HIGH (ref 70–99)
Potassium: 3.6 mmol/L (ref 3.5–5.1)
Sodium: 135 mmol/L (ref 135–145)
Total Bilirubin: 1.4 mg/dL — ABNORMAL HIGH (ref 0.3–1.2)
Total Protein: 6.4 g/dL — ABNORMAL LOW (ref 6.5–8.1)

## 2021-01-28 LAB — CBC WITH DIFFERENTIAL/PLATELET
Abs Immature Granulocytes: 0.09 10*3/uL — ABNORMAL HIGH (ref 0.00–0.07)
Basophils Absolute: 0 10*3/uL (ref 0.0–0.1)
Basophils Relative: 1 %
Eosinophils Absolute: 0 10*3/uL (ref 0.0–0.5)
Eosinophils Relative: 0 %
HCT: 32.9 % — ABNORMAL LOW (ref 36.0–46.0)
Hemoglobin: 10.5 g/dL — ABNORMAL LOW (ref 12.0–15.0)
Immature Granulocytes: 1 %
Lymphocytes Relative: 23 %
Lymphs Abs: 1.6 10*3/uL (ref 0.7–4.0)
MCH: 25.4 pg — ABNORMAL LOW (ref 26.0–34.0)
MCHC: 31.9 g/dL (ref 30.0–36.0)
MCV: 79.7 fL — ABNORMAL LOW (ref 80.0–100.0)
Monocytes Absolute: 0.7 10*3/uL (ref 0.1–1.0)
Monocytes Relative: 10 %
Neutro Abs: 4.7 10*3/uL (ref 1.7–7.7)
Neutrophils Relative %: 65 %
Platelets: 296 10*3/uL (ref 150–400)
RBC: 4.13 MIL/uL (ref 3.87–5.11)
RDW: 22.8 % — ABNORMAL HIGH (ref 11.5–15.5)
WBC: 7.1 10*3/uL (ref 4.0–10.5)
nRBC: 0 % (ref 0.0–0.2)

## 2021-01-28 LAB — URINALYSIS, DIPSTICK ONLY
Bilirubin Urine: NEGATIVE
Glucose, UA: NEGATIVE mg/dL
Hgb urine dipstick: NEGATIVE
Ketones, ur: NEGATIVE mg/dL
Leukocytes,Ua: NEGATIVE
Nitrite: NEGATIVE
Protein, ur: NEGATIVE mg/dL
Specific Gravity, Urine: 1.004 — ABNORMAL LOW (ref 1.005–1.030)
pH: 8 (ref 5.0–8.0)

## 2021-01-28 LAB — GLUCOSE, CAPILLARY
Glucose-Capillary: 133 mg/dL — ABNORMAL HIGH (ref 70–99)
Glucose-Capillary: 101 mg/dL — ABNORMAL HIGH (ref 70–99)
Glucose-Capillary: 148 mg/dL — ABNORMAL HIGH (ref 70–99)

## 2021-01-28 LAB — LACTATE DEHYDROGENASE: LDH: 426 U/L — ABNORMAL HIGH (ref 98–192)

## 2021-01-28 MED ORDER — LEUCOVORIN CALCIUM INJECTION 100 MG
15.0000 mg/m2 | INTRAMUSCULAR | Status: AC
  Administered 2021-01-29 – 2021-01-30 (×8): 26 mg via INTRAVENOUS
  Filled 2021-01-28 (×9): qty 1.3

## 2021-01-28 MED ORDER — SODIUM CHLORIDE 0.9 % IV SOLN
16.0000 mg | Freq: Once | INTRAVENOUS | Status: AC
  Administered 2021-01-28: 16 mg via INTRAVENOUS
  Filled 2021-01-28: qty 8.59
  Filled 2021-01-28: qty 8

## 2021-01-28 NOTE — Progress Notes (Signed)
Thankfully, her mental status is little bit better.  She still has a Actuary.  Again I think this is all from the Decadron that she was getting.  We have stopped Decadron..  She is on the high-dose methotrexate infusion now.  We will start checking her methotrexate levels tomorrow.  We will start the leucovorin rescue tomorrow.  She has had no nausea or vomiting.  She has been eating okay.  Hopefully, she is walking..  She has had no rashes.  There is been no bleeding.  Is been no change in bowel or bladder habits.  Her labs show white cell count of 7.1.  Hemoglobin 10.5.  Platelet count 296,000.  LDH is 426.  BUN is 15 creatinine 0.57.  LFTs are slightly elevated.  Her vital signs show temperature 97.9.  73 for pulse.  Blood pressure 137/82.  Her oral exam shows no mucositis.  Lungs are clear.  Cardiac exam regular rate and rhythm.  Abdomen is soft.  Bowel sounds are present.  There is no guarding or rebound tenderness.  Skin exam shows no rashes.  Neurological exam shows no focal deficits.  Ms. Kester will finish her chemotherapy today.  She is on the 24-hour high-dose methotrexate infusion.  We will start the leucovorin rescue per protocol.  I will be checking her methotrexate levels daily.  Again, we have to avoid dexamethasone with her.  I know the staff on [address] are doing a great job with her.  I appreciate all of their outstanding help and compassion.  [name], MD  1 Corinthians 16:13-14

## 2021-01-28 NOTE — Care Management Important Message (Signed)
Important Message  Patient Details IM Letter placed in Patients room. Name: Magalene Mclear MRN: [id number] Date of Birth: 12-18-1955   Medicare Important Message Given:  Yes     [name] 01/28/2021, 11:08 AM

## 2021-01-29 ENCOUNTER — Encounter (HOSPITAL_COMMUNITY): Payer: Self-pay | Admitting: Hematology & Oncology

## 2021-01-29 DIAGNOSIS — R400 Somnolence: Secondary | ICD-10-CM | POA: Diagnosis not present

## 2021-01-29 LAB — GLUCOSE, CAPILLARY
Glucose-Capillary: 156 mg/dL — ABNORMAL HIGH (ref 70–99)
Glucose-Capillary: 144 mg/dL — ABNORMAL HIGH (ref 70–99)
Glucose-Capillary: 161 mg/dL — ABNORMAL HIGH (ref 70–99)

## 2021-01-29 LAB — LACTATE DEHYDROGENASE: LDH: 349 U/L — ABNORMAL HIGH (ref 98–192)

## 2021-01-29 LAB — CBC WITH DIFFERENTIAL/PLATELET
Abs Immature Granulocytes: 0.04 10*3/uL (ref 0.00–0.07)
Basophils Absolute: 0 10*3/uL (ref 0.0–0.1)
Basophils Relative: 0 %
Eosinophils Absolute: 0 10*3/uL (ref 0.0–0.5)
Eosinophils Relative: 0 %
HCT: 29.2 % — ABNORMAL LOW (ref 36.0–46.0)
Hemoglobin: 9.3 g/dL — ABNORMAL LOW (ref 12.0–15.0)
Immature Granulocytes: 1 %
Lymphocytes Relative: 17 %
Lymphs Abs: 1.1 10*3/uL (ref 0.7–4.0)
MCH: 25.6 pg — ABNORMAL LOW (ref 26.0–34.0)
MCHC: 31.8 g/dL (ref 30.0–36.0)
MCV: 80.4 fL (ref 80.0–100.0)
Monocytes Absolute: 0.4 10*3/uL (ref 0.1–1.0)
Monocytes Relative: 6 %
Neutro Abs: 5.1 10*3/uL (ref 1.7–7.7)
Neutrophils Relative %: 76 %
Platelet Morphology: NORMAL
Platelets: 271 10*3/uL (ref 150–400)
RBC: 3.63 MIL/uL — ABNORMAL LOW (ref 3.87–5.11)
RDW: 22.1 % — ABNORMAL HIGH (ref 11.5–15.5)
WBC: 6.7 10*3/uL (ref 4.0–10.5)
nRBC: 0 % (ref 0.0–0.2)

## 2021-01-29 LAB — URINALYSIS, DIPSTICK ONLY
Bilirubin Urine: NEGATIVE
Glucose, UA: NEGATIVE mg/dL
Ketones, ur: NEGATIVE mg/dL
Leukocytes,Ua: NEGATIVE
Nitrite: NEGATIVE
Protein, ur: 30 mg/dL — AB
Specific Gravity, Urine: 1.01 (ref 1.005–1.030)
pH: 8.5 — ABNORMAL HIGH (ref 5.0–8.0)

## 2021-01-29 LAB — COMPREHENSIVE METABOLIC PANEL
ALT: 63 U/L — ABNORMAL HIGH (ref 0–44)
AST: 50 U/L — ABNORMAL HIGH (ref 15–41)
Albumin: 3 g/dL — ABNORMAL LOW (ref 3.5–5.0)
Alkaline Phosphatase: 118 U/L (ref 38–126)
Anion gap: 4 — ABNORMAL LOW (ref 5–15)
BUN: 15 mg/dL (ref 8–23)
CO2: 34 mmol/L — ABNORMAL HIGH (ref 22–32)
Calcium: 8.7 mg/dL — ABNORMAL LOW (ref 8.9–10.3)
Chloride: 99 mmol/L (ref 98–111)
Creatinine, Ser: 0.81 mg/dL (ref 0.44–1.00)
GFR, Estimated: >60 mL/min (ref >60)
Glucose, Bld: 124 mg/dL — ABNORMAL HIGH (ref 70–99)
Potassium: 2.7 mmol/L — CL (ref 3.5–5.1)
Sodium: 137 mmol/L (ref 135–145)
Total Bilirubin: 0.7 mg/dL (ref 0.3–1.2)
Total Protein: 5.7 g/dL — ABNORMAL LOW (ref 6.5–8.1)

## 2021-01-29 MED ORDER — POTASSIUM CHLORIDE 10 MEQ/100ML IV SOLN
10.0000 meq | INTRAVENOUS | Status: AC
  Administered 2021-01-29 (×6): 10 meq via INTRAVENOUS
  Filled 2021-01-29 (×6): qty 100

## 2021-01-29 MED ORDER — LEUCOVORIN CALCIUM INJECTION 100 MG
15.0000 mg/m2 | INTRAMUSCULAR | Status: DC
  Administered 2021-01-30: 05:00:00 26 mg via INTRAVENOUS
  Filled 2021-01-29 (×7): qty 1.3

## 2021-01-29 MED ORDER — FLUCONAZOLE 100 MG PO TABS
100.0000 mg | ORAL_TABLET | Freq: Every day | ORAL | Status: DC
  Administered 2021-01-29 – 2021-02-06 (×9): 100 mg via ORAL
  Filled 2021-01-29 (×9): qty 1

## 2021-01-29 MED ORDER — PILOCARPINE HCL 1.25 % OP SOLN
1.0000 [drp] | Freq: Every day | OPHTHALMIC | Status: DC
  Administered 2021-01-29 – 2021-02-08 (×11): 1 [drp] via OPHTHALMIC

## 2021-01-29 MED ORDER — LEUCOVORIN CALCIUM INJECTION 100 MG
15.0000 mg/m2 | Freq: Four times a day (QID) | INTRAMUSCULAR | Status: DC
  Filled 2021-01-29: qty 1.3

## 2021-01-29 NOTE — Progress Notes (Signed)
I think that there is still some element of confusion.  It sounds like she did not sleep a lot last night.  She says that the IV Going off.  She is not hurting.  She is off Decadron.  Sometimes, it can take 2 or 3 days before the Decadron is out of the system.  She has had a little bit of nausea but no vomiting..  She is completed the high-dose methotrexate.  We will start the leucovorin rescue.  We will check her methotrexate levels.  She has had no bleeding.  There is been no diarrhea.  Her appetite still seems to be pretty good.  Her labs show white cell count 6.7.  Hemoglobin 9.3.  Platelet count 271,000.  The metabolic panel is not yet back.  She has had no mouth sores.  She is using the mouth rinse.  Her vital signs show temperature 98.5.  Pulse 86.  Blood pressure 136/75.  Her oral exam does not show any obvious mucositis.  It is hard to say if there is any thrush.  Lungs are clear.  Cardiac exam regular rate and rhythm.  Abdomen is soft.  Bowel sounds are present.  Extremity shows no clubbing, cyanosis or edema.  Neurological exam shows no focal neurological deficits.  She does have a little bit of "excitement."  We have completed chemotherapy with R-MTV.  We now are giving her leucovorin.  We will monitor the methotrexate levels.  Hopefully, this confusion will improve.  Again I have to believe this is from Decadron.  She has not had Decadron now for about 2 days.  We are going to have to find a assisted living or skilled nursing facility for her.  I think that it just is going to be very difficult to take care of her at home.  I would not anticipate any discharge for least another 5-6 days.  [name], MD  2 Cor 5:7

## 2021-01-29 NOTE — TOC Progression Note (Signed)
CSW received notification of pts daughter wishing to speak with TOC. CSW spoke with pts daughter to explain what updates there were currently. CSW explained that CSW following case will reach out Monday to work on plan and talk about facilities. CSW to leave note for [facility] team to follow up with daughter.

## 2021-01-29 NOTE — Progress Notes (Signed)
Methotrexate level and Leucovorin Dosing  Labs: spoke with lab at [facility] and was told that the MTX level was 2.84. This was drawn ~0700 this AM  A/P: Per leucovorin rescue nomogram, level was drawn ~40 hours after the start of the MTX infusion. Discussed with oncall oncologist Dr. Irene Limbo and he agreed that based on this level and the nomogram, we should continue the current leucovorin dosing of 26mg  IV q3h  Adrian Saran, PharmD, BCPS Secure Chat if ?s 01/29/2021 6:16 PM

## 2021-01-30 ENCOUNTER — Encounter (HOSPITAL_COMMUNITY): Payer: Self-pay | Admitting: Hematology & Oncology

## 2021-01-30 DIAGNOSIS — C8510 Unspecified B-cell lymphoma, unspecified site: Secondary | ICD-10-CM | POA: Diagnosis not present

## 2021-01-30 LAB — COMPREHENSIVE METABOLIC PANEL
ALT: 113 U/L — ABNORMAL HIGH (ref 0–44)
ALT: 168 U/L — ABNORMAL HIGH (ref 0–44)
AST: 89 U/L — ABNORMAL HIGH (ref 15–41)
AST: 121 U/L — ABNORMAL HIGH (ref 15–41)
Albumin: 2.7 g/dL — ABNORMAL LOW (ref 3.5–5.0)
Albumin: 2.9 g/dL — ABNORMAL LOW (ref 3.5–5.0)
Alkaline Phosphatase: 101 U/L (ref 38–126)
Alkaline Phosphatase: 100 U/L (ref 38–126)
Anion gap: 5 (ref 5–15)
Anion gap: 4 — ABNORMAL LOW (ref 5–15)
BUN: 19 mg/dL (ref 8–23)
BUN: 20 mg/dL (ref 8–23)
CO2: 34 mmol/L — ABNORMAL HIGH (ref 22–32)
CO2: 36 mmol/L — ABNORMAL HIGH (ref 22–32)
Calcium: 8.7 mg/dL — ABNORMAL LOW (ref 8.9–10.3)
Calcium: 8.6 mg/dL — ABNORMAL LOW (ref 8.9–10.3)
Chloride: 97 mmol/L — ABNORMAL LOW (ref 98–111)
Chloride: 98 mmol/L (ref 98–111)
Creatinine, Ser: 1.5 mg/dL — ABNORMAL HIGH (ref 0.44–1.00)
Creatinine, Ser: 1.7 mg/dL — ABNORMAL HIGH (ref 0.44–1.00)
GFR, Estimated: 38 mL/min — ABNORMAL LOW (ref >60)
GFR, Estimated: 33 mL/min — ABNORMAL LOW (ref >60)
Glucose, Bld: 152 mg/dL — ABNORMAL HIGH (ref 70–99)
Glucose, Bld: 141 mg/dL — ABNORMAL HIGH (ref 70–99)
Potassium: 2.9 mmol/L — ABNORMAL LOW (ref 3.5–5.1)
Potassium: 3.8 mmol/L (ref 3.5–5.1)
Sodium: 136 mmol/L (ref 135–145)
Sodium: 138 mmol/L (ref 135–145)
Total Bilirubin: 0.8 mg/dL (ref 0.3–1.2)
Total Bilirubin: 0.6 mg/dL (ref 0.3–1.2)
Total Protein: 5.3 g/dL — ABNORMAL LOW (ref 6.5–8.1)
Total Protein: 5.8 g/dL — ABNORMAL LOW (ref 6.5–8.1)

## 2021-01-30 LAB — CBC WITH DIFFERENTIAL/PLATELET
Abs Immature Granulocytes: 0.02 10*3/uL (ref 0.00–0.07)
Basophils Absolute: 0 10*3/uL (ref 0.0–0.1)
Basophils Relative: 0 %
Eosinophils Absolute: 0 10*3/uL (ref 0.0–0.5)
Eosinophils Relative: 0 %
HCT: 26.3 % — ABNORMAL LOW (ref 36.0–46.0)
Hemoglobin: 8.5 g/dL — ABNORMAL LOW (ref 12.0–15.0)
Immature Granulocytes: 0 %
Lymphocytes Relative: 16 %
Lymphs Abs: 0.9 10*3/uL (ref 0.7–4.0)
MCH: 26.1 pg (ref 26.0–34.0)
MCHC: 32.3 g/dL (ref 30.0–36.0)
MCV: 80.7 fL (ref 80.0–100.0)
Monocytes Absolute: 0.2 10*3/uL (ref 0.1–1.0)
Monocytes Relative: 3 %
Neutro Abs: 4.7 10*3/uL (ref 1.7–7.7)
Neutrophils Relative %: 81 %
Platelets: 248 10*3/uL (ref 150–400)
RBC: 3.26 MIL/uL — ABNORMAL LOW (ref 3.87–5.11)
RDW: 22.1 % — ABNORMAL HIGH (ref 11.5–15.5)
WBC: 5.8 10*3/uL (ref 4.0–10.5)
nRBC: 0 % (ref 0.0–0.2)

## 2021-01-30 LAB — LACTATE DEHYDROGENASE: LDH: 358 U/L — ABNORMAL HIGH (ref 98–192)

## 2021-01-30 LAB — URINALYSIS, DIPSTICK ONLY
Bilirubin Urine: NEGATIVE
Glucose, UA: NEGATIVE mg/dL
Ketones, ur: NEGATIVE mg/dL
Leukocytes,Ua: NEGATIVE
Nitrite: NEGATIVE
Protein, ur: NEGATIVE mg/dL
Specific Gravity, Urine: 1.004 — ABNORMAL LOW (ref 1.005–1.030)
pH: 9 — ABNORMAL HIGH (ref 5.0–8.0)

## 2021-01-30 MED ORDER — LEUCOVORIN CALCIUM INJECTION 100 MG
15.0000 mg/m2 | Freq: Four times a day (QID) | INTRAMUSCULAR | Status: AC
  Administered 2021-01-31 (×4): 26 mg via INTRAVENOUS
  Filled 2021-01-30 (×5): qty 1.3

## 2021-01-30 MED ORDER — POTASSIUM CHLORIDE CRYS ER 20 MEQ PO TBCR
40.0000 meq | EXTENDED_RELEASE_TABLET | Freq: Once | ORAL | Status: AC
  Administered 2021-01-30: 09:00:00 40 meq via ORAL
  Filled 2021-01-30: qty 2

## 2021-01-30 MED ORDER — SODIUM BICARBONATE 8.4 % IV SOLN
INTRAVENOUS | Status: DC
  Filled 2021-01-30: qty 150
  Filled 2021-01-30: qty 1000
  Filled 2021-01-30 (×2): qty 150
  Filled 2021-01-30 (×2): qty 1000
  Filled 2021-01-30: qty 150
  Filled 2021-01-30: qty 1000
  Filled 2021-01-30 (×6): qty 150
  Filled 2021-01-30: qty 1000
  Filled 2021-01-30: qty 150
  Filled 2021-01-30 (×2): qty 1000

## 2021-01-30 MED ORDER — LEUCOVORIN CALCIUM INJECTION 200 MG
150.0000 mg | INTRAMUSCULAR | Status: AC
  Administered 2021-01-30 (×4): 150 mg via INTRAVENOUS
  Filled 2021-01-30 (×6): qty 7.5

## 2021-01-30 MED ORDER — POTASSIUM CHLORIDE 10 MEQ/100ML IV SOLN
10.0000 meq | INTRAVENOUS | Status: AC
  Administered 2021-01-30 (×6): 10 meq via INTRAVENOUS
  Filled 2021-01-30 (×6): qty 100

## 2021-01-30 NOTE — Progress Notes (Signed)
Methotrexate level and Leucovorin Dosing  Labs: spoke with lab at [facility] and was told that the MTX level was 0.96. This was drawn ~ 0535 this AM. Requested that [facility] lab try to fax result to Pharmacy so that [facility] lab can enter levels. [facility] lab attempted to fax many times yesterday - no result.   A/P: Per leucovorin rescue nomogram, level was drawn ~67 hours after the start of the MTX infusion. Discussed with oncall oncologist Dr. Irene Limbo and he agreed that based on this level and the nomogram, we should reduce the current leucovorin dose back to 26mg  IV q6h x 4 doses.   Next MTX ordered for 12/5  Minda Ditto PharmD [facility] [phone] 01/30/2021, 3:08 PM

## 2021-01-30 NOTE — Progress Notes (Signed)
Methotrexate level and Leucovorin Dosing  Labs: MTX level drawn this am, will be sent to [facility] Lab SCr greatly elevated this am to 1.5, K remains low this am at 2.9 despite IV repletion yesterday   A/P: Call to Dr Irene Limbo for recommendations With increase in SCr will increase Leucovorin dose to 150mg  q3hr until MTX level < 1 per current literature recommendations. Consider Leucovorin dose decrease at that time, continuing till MTX level < 0.05  KCl 40 mEq po x1 KCl 10 mEq runs x 6 Increase Na Bicarb infusion to 100 ml/hr after 2 KCl runs infused  Minda Ditto PharmD Secure Chat if ?s 01/30/2021 7:55 AM

## 2021-01-30 NOTE — Progress Notes (Signed)
Ariel [name]  HEMATOLOGY/ONCOLOGY INPATIENT PROGRESS NOTE  Date of Service: 01/30/2021  Inpatient Attending: .Volanda Napoleon, MD   SUBJECTIVE   Oncology History  Melanoma [facility])  2021 Initial Diagnosis   Melanoma ([place])   12/01/2020 Cancer Staging   Staging form: Melanoma of the Skin, AJCC 8th Edition - Clinical stage from 12/01/2020: Stage IB (cT1b, cN0, cM0) - Signed by Volanda Napoleon, MD on 12/01/2020    High grade B-cell lymphoma ([place])  12/22/2020 Initial Diagnosis   High grade B-cell lymphoma ([place])   01/04/2021 - 01/10/2021 Chemotherapy   Patient is on Treatment Plan : NON-HODGKINS LYMPHOMA R-ICE q21d     01/25/2021 -  Chemotherapy   Patient is on Treatment Plan : IP NON-HODGKINS LYMPHOMA R-MTV q21d       Patient was seen in follow-up on behalf of Dr. Marin Olp for high-grade B-cell lymphoma with CNS involvement.  Patient is status posttreatment as noted above and received 1 cycle of R-ICE and now has received R-MTV.  Patient notes some fatigue and minimal nausea but no vomiting. Grade 1 oral mucositis which is resolving.  She has been using salt and baking soda mouthwashes 4-5 times a day. Continues to have significant hypokalemia and additional potassium replacement was ordered today.. Creatinine bumped up to 1.5.  After some potassium replacement her IV bicarb drip was increased to 100 mL/h. No headaches.  No acute shortness of breath.  No fever. Encouraged to drink a lot of oral fluids.  OBJECTIVE:  NAD  PHYSICAL EXAMINATION: . Vitals:   01/29/21 1329 01/29/21 2305 01/30/21 0530 01/30/21 1345  BP: 138/61 (!) 130/57 (!) 121/57 127/70  Pulse: 76 76 81 82  Resp: 18 18 16 16   Temp: 98 F (36.7 C) 98.6 F (37 C) 98.1 F (36.7 C) 98.1 F (36.7 C)  TempSrc: Oral Oral Oral Oral  SpO2: 98% 96% 97% 97%  Weight:      Height:       Filed Weights   01/24/21 0837  Weight: 138 lb 14.2 oz (63 kg)   .Body mass index is 22.42 kg/m. Ariel [name] GENERAL:alert, in no acute distress  and comfortable SKIN: no acute rashes, no significant lesions EYES: conjunctiva are pink and non-injected, sclera anicteric OROPHARYNX: MMM, no exudates, no oropharyngeal erythema or ulceration NECK: supple, no JVD LYMPH:  no palpable lymphadenopathy in the cervical region. LUNGS: clear to auscultation b/l with normal respiratory effort HEART: regular rate & rhythm ABDOMEN:  normoactive bowel sounds , non tender, not distended. Extremity: trace pedal edema b/l PSYCH: alert & oriented x 3 with fluent speech.  NEURO: no focal motor/sensory deficits   MEDICAL HISTORY:  Past Medical History:  Diagnosis Date   Chicken pox    Complication of anesthesia    Per patient, very slow to wake up after anesthesia   Goals of care, counseling/discussion 12/22/2020   High cholesterol    High grade B-cell lymphoma ([place]) 12/22/2020   Hyperglycemia 11/10/2020   Melanoma ([place]) 2021   right hip   PONV (postoperative nausea and vomiting)    Urinary incontinence     SURGICAL HISTORY: Past Surgical History:  Procedure Laterality Date   AIR/FLUID EXCHANGE Right 12/07/2020   Procedure: AIR/FLUID EXCHANGE;  Surgeon: Jalene Mullet, MD;  Location: [place];  Service: Ophthalmology;  Laterality: Right;   APPENDECTOMY  1962   BIOPSY  12/14/2020   Procedure: BIOPSY;  Surgeon: Rush Landmark Telford Nab., MD;  Location: [place];  Service: Gastroenterology;;   ESOPHAGOGASTRODUODENOSCOPY (EGD) WITH PROPOFOL N/A 12/14/2020  Procedure: ESOPHAGOGASTRODUODENOSCOPY (EGD) WITH PROPOFOL;  Surgeon: Rush Landmark Telford Nab., MD;  Location: [place];  Service: Gastroenterology;  Laterality: N/A;   EXCISION MELANOMA WITH SENTINEL LYMPH NODE BIOPSY Right 10/09/2019   Procedure: WIDE LOCAL EXCISION WITH ADVANCEMENT FLAP CLOSURE RIGHT HIP MELANOMA WITH SENTINEL LYMPH NODE BIOPSY;  Surgeon: Stark Klein, MD;  Location: [place];  Service: General;  Laterality: Right;   FINE NEEDLE ASPIRATION  12/14/2020   Procedure: FINE  NEEDLE ASPIRATION (FNA) LINEAR;  Surgeon: Irving Copas., MD;  Location: [place];  Service: Gastroenterology;;   HERNIA REPAIR  2009   IR IMAGING GUIDED PORT INSERTION  12/30/2020   MOUTH SURGERY  11/2015   gum surgery and bone implant   PARS PLANA VITRECTOMY Right 12/07/2020   Procedure: PARS PLANA VITRECTOMY WITH 25 GAUGE;  Surgeon: Jalene Mullet, MD;  Location: [place];  Service: Ophthalmology;  Laterality: Right;   PHOTOCOAGULATION WITH LASER Right 12/07/2020   Procedure: PHOTOCOAGULATION WITH ENDOLASER PANRITINAL COAGULATION;  Surgeon: Jalene Mullet, MD;  Location: [place];  Service: Ophthalmology;  Laterality: Right;   UPPER ESOPHAGEAL ENDOSCOPIC ULTRASOUND (EUS) N/A 12/14/2020   Procedure: UPPER ESOPHAGEAL ENDOSCOPIC ULTRASOUND (EUS);  Surgeon: Irving Copas., MD;  Location: [place];  Service: Gastroenterology;  Laterality: N/A;   WISDOM TOOTH EXTRACTION      SOCIAL HISTORY: Social History   Socioeconomic History   Marital status: Widowed    Spouse name: Not on file   Number of children: Not on file   Years of education: Not on file   Highest education level: Not on file  Occupational History   Occupation: retired  Tobacco Use   Smoking status: Never   Smokeless tobacco: Never  Vaping Use   Vaping Use: Never used  Substance and Sexual Activity   Alcohol use: Never   Drug use: Never   Sexual activity: Not Currently  Other Topics Concern   Not on file  Social History Narrative   Lives with husband   One Son- lives locally, 1 grand-daughter   Daughter- Sport and exercise psychologist. [place]   Completed Hotel manager school   Works as a Probation officer.         Social Determinants of Radio broadcast assistant Strain: Low Risk    Difficulty of Paying Living Expenses: Not hard at all  Food Insecurity: No Food Insecurity   Worried About Charity fundraiser in the Last Year: Never true   Arboriculturist in the Last Year: Never true  Transportation Needs: No  Transportation Needs   Lack of Transportation (Medical): No   Lack of Transportation (Non-Medical): No  Physical Activity: Sufficiently Active   Days of Exercise per Week: 4 days   Minutes of Exercise per Session: 40 min  Stress: No Stress Concern Present   Feeling of Stress : Not at all  Social Connections: Moderately Isolated   Frequency of Communication with Friends and Family: More than three times a week   Frequency of Social Gatherings with Friends and Family: More than three times a week   Attends Religious Services: More than 4 times per year   Active Member of [employer] or Organizations: No   Attends Archivist Meetings: Never   Marital Status: Widowed  Human resources officer Violence: Not At Risk   Fear of Current or Ex-Partner: No   Emotionally Abused: No   Physically Abused: No   Sexually Abused: No    FAMILY HISTORY: Family History  Problem Relation Age of Onset  Coronary artery disease Father        died at 34   COPD Father    Alcohol abuse Father    Diabetes Mellitus II Father    Coronary artery disease Sister    Cancer Sister        breast   Heart attack Brother    Hyperlipidemia Other        died 72- "natural causes"   Hyperlipidemia Other        4 siblings    Hypertension Other        3 siblings    ALLERGIES:  is allergic to codeine and plavix [clopidogrel].  MEDICATIONS:  Scheduled Meds:  acyclovir  200 mg Oral Daily   Chlorhexidine Gluconate Cloth  6 each Topical Daily   enoxaparin (LOVENOX) injection  40 mg Subcutaneous Q24H   fluconazole  100 mg Oral Daily   leucovorin  150 mg Intravenous Q3H   OLANZapine zydis  5 mg Oral QHS   Pilocarpine HCl  1 drop Right Eye Daily   sodium bicarbonate/sodium chloride   Mouth Rinse Q4H   Continuous Infusions:  potassium chloride 10 mEq (01/30/21 1141)   sodium bicarbonate 150 mEq in D5W infusion 100 mL/hr at 01/30/21 1003   PRN Meds:.acetaminophen **OR** acetaminophen, haloperidol lactate, Hot Pack,  lip balm, ondansetron **OR** ondansetron (ZOFRAN) IV, sodium chloride flush, temazepam  REVIEW OF SYSTEMS:    10 Point review of Systems was done is negative except as noted above.  LABORATORY DATA:  I have reviewed the data as listed  . CBC Latest Ref Rng & Units 01/30/2021 01/29/2021 01/28/2021  WBC 4.0 - 10.5 K/uL 5.8 6.7 7.1  Hemoglobin 12.0 - 15.0 g/dL 8.5(L) 9.3(L) 10.5(L)  Hematocrit 36.0 - 46.0 % 26.3(L) 29.2(L) 32.9(L)  Platelets 150 - 400 K/uL 248 271 296    . CMP Latest Ref Rng & Units 01/30/2021 01/30/2021 01/29/2021  Glucose 70 - 99 mg/dL 141(H) 152(H) 124(H)  BUN 8 - 23 mg/dL 20 19 15   Creatinine 0.44 - 1.00 mg/dL 1.70(H) 1.50(H) 0.81  Sodium 135 - 145 mmol/L 138 136 137  Potassium 3.5 - 5.1 mmol/L 3.8 2.9(L) 2.7(LL)  Chloride 98 - 111 mmol/L 98 97(L) 99  CO2 22 - 32 mmol/L 36(H) 34(H) 34(H)  Calcium 8.9 - 10.3 mg/dL 8.6(L) 8.7(L) 8.7(L)  Total Protein 6.5 - 8.1 g/dL 5.8(L) 5.3(L) 5.7(L)  Total Bilirubin 0.3 - 1.2 mg/dL 0.6 0.8 0.7  Alkaline Phos 38 - 126 U/L 100 101 118  AST 15 - 41 U/L 121(H) 89(H) 50(H)  ALT 0 - 44 U/L 168(H) 113(H) 63(H)     RADIOGRAPHIC STUDIES: I have personally reviewed the radiological images as listed and agreed with the findings in the report. DG Chest 2 View  Result Date: 01/14/2021 CLINICAL DATA:  Altered mental status. EXAM: CHEST - 2 VIEW COMPARISON:  PET-CT 12/27/2020 FINDINGS: Right chest wall port a catheter is noted with tip in the projection of the cavoatrial junction. The heart size and mediastinal contours are within normal limits. Both lungs are clear. The visualized skeletal structures are unremarkable. IMPRESSION: No active cardiopulmonary disease. Electronically Signed   By: Kerby Moors M.D.   On: 01/14/2021 14:36   CT Head Wo Contrast  Result Date: 01/14/2021 CLINICAL DATA:  Altered mental status, history of metastatic disease in the brain EXAM: CT HEAD WITHOUT CONTRAST TECHNIQUE: Contiguous axial images were obtained  from the base of the skull through the vertex without intravenous contrast. COMPARISON:  Brain MRI  and head CT 01/02/2021 FINDINGS: Brain: There is hypodensity in the left posterior temporal lobe and parietal lobe consistent with known metastatic lesions with surrounding vasogenic edema better evaluated on recent brain MRI. Compared to the most recent head CT dated 01/02/2021, the lesions are decreased in density which may reflect evolving post treatment change. There is no evidence of acute territorial infarct, acute intracranial hemorrhage, or extra-axial fluid collection. The ventricles are stable in size. There is no midline shift. Vascular: No hyperdense vessel or unexpected calcification. Skull: Normal. Negative for fracture or focal lesion. Sinuses/Orbits: The imaged paranasal sinuses are clear. The mastoid air cells are clear. The globes and orbits are unremarkable. Other: None. IMPRESSION: 1. No evidence of acute intracranial hemorrhage, territorial infarct, or extra-axial fluid collection. 2. Decreased density of the lesions in the left temporal and parietal lobes which may reflect evolving post treatment change. This could be further evaluated with MRI as indicated. Electronically Signed   By: Valetta Mole M.D.   On: 01/14/2021 13:04   CT HEAD WO CONTRAST  Result Date: 01/02/2021 CLINICAL DATA:  Transient ischemic attack. EXAM: CT HEAD WITHOUT CONTRAST TECHNIQUE: Contiguous axial images were obtained from the base of the skull through the vertex without intravenous contrast. COMPARISON:  December 08, 2020 FINDINGS: Brain: No evidence of acute infarction, hemorrhage, hydrocephalus, or extra-axial collection. Again seen are areas of hypoattenuation in the left temporal and parietal lobes, which correspond to metastatic lesions with vasogenic edema, in correlation with prior MRI. Vascular: No hyperdense vessel or unexpected calcification. Skull: Normal. Negative for fracture or focal lesion.  Sinuses/Orbits: No acute finding. Other: None. IMPRESSION: 1. No acute intracranial abnormality. 2. Stable areas of hypoattenuation in the left temporal and parietal lobes, which correspond to metastatic lesions with vasogenic edema, in correlation with prior MRI. Electronically Signed   By: Fidela Salisbury M.D.   On: 01/02/2021 15:57   MR BRAIN W CONTRAST  Result Date: 01/21/2021 CLINICAL DATA:  Hematologic malignancy, assess treatment response; assess for shrinkage of CNS lymphoma EXAM: MRI HEAD WITH CONTRAST TECHNIQUE: Multiplanar, multiecho pulse sequences of the brain and surrounding structures were obtained with intravenous contrast. CONTRAST:  20mL GADAVIST GADOBUTROL 1 MMOL/ML IV SOLN COMPARISON:  01/15/2021 FINDINGS: Brain: Post contrast imaging demonstrates substantially reduced enhancement of the left posterior temporal lesion. Enhancement associated with the left smaller left parietal lesion has essentially resolved. No new mass or abnormal enhancement. Vascular: Unremarkable. Skull and upper cervical spine: Unremarkable. Sinuses/Orbits: No new abnormality. Other: None. IMPRESSION: Significantly reduced enhancement associated with left temporal and parietal lesions. Together with findings on recent noncontrast study, this reflects positive treatment response. Electronically Signed   By: Macy Mis M.D.   On: 01/21/2021 09:45   MR BRAIN W WO CONTRAST  Result Date: 01/15/2021 CLINICAL DATA:  CNS lymphoma staging EXAM: MRI HEAD WITHOUT AND WITH CONTRAST TECHNIQUE: Multiplanar, multiecho pulse sequences of the brain and surrounding structures were obtained without and with intravenous contrast. CONTRAST:  6.29mL GADAVIST GADOBUTROL 1 MMOL/ML IV SOLN COMPARISON:  None. FINDINGS: Brain: No acute infarct, mass effect or extra-axial collection. Extensive susceptibility effect throughout the brain is likely due to iron infusion. Area of hyperintense T2-weighted signal within the left temporal lobe  with associated edema has decreased. The hyperintense T2-weighted signal lesion of the posterior left parietal lobe is also smaller. Despite administration of intravenous contrast agent, it does not appear that any contrast agent reached the head. This limits assessment for enhancement. Vascular: Major flow voids are preserved. Skull and  upper cervical spine: Normal calvarium and skull base. Visualized upper cervical spine and soft tissues are normal. Sinuses/Orbits:No paranasal sinus fluid levels or advanced mucosal thickening. No mastoid or middle ear effusion. Normal orbits. IMPRESSION: 1. Decreased area of hyperintense T2-weighted signal in the left temporal lobe and posterior left parietal lobe. 2. Poor contrast bolus prevents assessment for residual enhancement. A repeat MRI with contrast may be helpful. 3. Extensive susceptibility effect throughout the brain likely due to iron infusion. Electronically Signed   By: Ulyses Jarred M.D.   On: 01/15/2021 21:22   MR Brain W and Wo Contrast  Result Date: 01/02/2021 CLINICAL DATA:  Metastatic disease evaluation EXAM: MRI HEAD WITHOUT AND WITH CONTRAST TECHNIQUE: Multiplanar, multiecho pulse sequences of the brain and surrounding structures were obtained without and with intravenous contrast. CONTRAST:  6.70mL GADAVIST GADOBUTROL 1 MMOL/ML IV SOLN COMPARISON:  12/08/2020 FINDINGS: Brain: No acute infarct, mass effect or extra-axial collection. No acute or chronic hemorrhage. There is multifocal hyperintense T2-weighted signal within the white matter. Parenchymal volume and CSF spaces are normal. Large contrast enhancing lesion in the left temporal lobe now measures 2.7 x 2.1 cm, previously 2.0 x 2.0 cm. There is mild surrounding edema. A second lesion, in the left parietal lobe is unchanged and measures 7 mm. There are no new lesions. The midline structures are normal. Vascular: Major flow voids are preserved. Skull and upper cervical spine: Normal calvarium and  skull base. Visualized upper cervical spine and soft tissues are normal. Sinuses/Orbits:No paranasal sinus fluid levels or advanced mucosal thickening. No mastoid or middle ear effusion. Normal orbits. IMPRESSION: 1. Slight increase in size of left temporal lobe lesion with mild surrounding edema. 2. Unchanged size of left parietal lobe lesion. 3. No new lesions. Electronically Signed   By: Ulyses Jarred M.D.   On: 01/02/2021 19:58    ASSESSMENT & PLAN:   65 year old female with   #1 high-grade large B-cell lymphoma with presumed CNS involvement  Patient is status post R-MTV and is receiving leucovorin rescue.  #2 acute renal insufficiency likely from high-dose methotrexate.  Creatinine 1.5  #3 abnormal liver function tests likely related to chemotherapy  #4 anemia due to chemotherapy hemoglobin 8.6  #5 severe hypokalemia potassium 2.9 PLAN -Labs reviewed with the patient.  Bump in kidney function and liver function noted. -Based on methotrexate levels and time after methotrexate administration since the levels are still up she received high-dose leucovorin rescue until levels are back today and now are less than 0.1. -She will continue leucovorin 50 mg per metered square rescue at this time. -Sodium bicarbonate infusion increased to 100 mL/h -Aggressive potassium replacement repeat potassium up from 2.9-3.8 -Avoid nephro and hepatotoxic medications. -Patient encouraged to drink a lot of free water. -Dr. Marin Olp will continue to follow from tomorrow.   I spent 25 minutes counseling the patient face to face. The total time spent in the appointment was 40 minutes and more than 50% was on counseling and direct patient cares and coordination with pharmacy regarding treatment.    Sullivan Lone MD MS AAHIVMS [facility] Hematology/Oncology Physician [place]  01/30/2021 2:10 PM

## 2021-01-31 ENCOUNTER — Encounter (HOSPITAL_COMMUNITY): Payer: Self-pay | Admitting: Hematology & Oncology

## 2021-01-31 ENCOUNTER — Ambulatory Visit: Payer: Medicare Other | Admitting: Family

## 2021-01-31 DIAGNOSIS — R400 Somnolence: Secondary | ICD-10-CM | POA: Diagnosis not present

## 2021-01-31 LAB — LACTATE DEHYDROGENASE: LDH: 370 U/L — ABNORMAL HIGH (ref 98–192)

## 2021-01-31 LAB — COMPREHENSIVE METABOLIC PANEL
ALT: 150 U/L — ABNORMAL HIGH (ref 0–44)
AST: 89 U/L — ABNORMAL HIGH (ref 15–41)
Albumin: 2.7 g/dL — ABNORMAL LOW (ref 3.5–5.0)
Alkaline Phosphatase: 84 U/L (ref 38–126)
Anion gap: 3 — ABNORMAL LOW (ref 5–15)
BUN: 17 mg/dL (ref 8–23)
CO2: 37 mmol/L — ABNORMAL HIGH (ref 22–32)
Calcium: 8.6 mg/dL — ABNORMAL LOW (ref 8.9–10.3)
Chloride: 98 mmol/L (ref 98–111)
Creatinine, Ser: 1.82 mg/dL — ABNORMAL HIGH (ref 0.44–1.00)
GFR, Estimated: 30 mL/min — ABNORMAL LOW (ref >60)
Glucose, Bld: 114 mg/dL — ABNORMAL HIGH (ref 70–99)
Potassium: 3.1 mmol/L — ABNORMAL LOW (ref 3.5–5.1)
Sodium: 138 mmol/L (ref 135–145)
Total Bilirubin: 0.6 mg/dL (ref 0.3–1.2)
Total Protein: 5.4 g/dL — ABNORMAL LOW (ref 6.5–8.1)

## 2021-01-31 LAB — CBC WITH DIFFERENTIAL/PLATELET
Abs Immature Granulocytes: 0.04 10*3/uL (ref 0.00–0.07)
Basophils Absolute: 0 10*3/uL (ref 0.0–0.1)
Basophils Relative: 0 %
Eosinophils Absolute: 0 10*3/uL (ref 0.0–0.5)
Eosinophils Relative: 0 %
HCT: 26.3 % — ABNORMAL LOW (ref 36.0–46.0)
Hemoglobin: 8.3 g/dL — ABNORMAL LOW (ref 12.0–15.0)
Immature Granulocytes: 1 %
Lymphocytes Relative: 17 %
Lymphs Abs: 0.9 10*3/uL (ref 0.7–4.0)
MCH: 25.9 pg — ABNORMAL LOW (ref 26.0–34.0)
MCHC: 31.6 g/dL (ref 30.0–36.0)
MCV: 82.2 fL (ref 80.0–100.0)
Monocytes Absolute: 0.1 10*3/uL (ref 0.1–1.0)
Monocytes Relative: 1 %
Neutro Abs: 4.5 10*3/uL (ref 1.7–7.7)
Neutrophils Relative %: 81 %
Platelets: 269 10*3/uL (ref 150–400)
RBC: 3.2 MIL/uL — ABNORMAL LOW (ref 3.87–5.11)
RDW: 21.8 % — ABNORMAL HIGH (ref 11.5–15.5)
WBC: 5.6 10*3/uL (ref 4.0–10.5)
nRBC: 0 % (ref 0.0–0.2)

## 2021-01-31 LAB — URINALYSIS, DIPSTICK ONLY
Bilirubin Urine: NEGATIVE
Glucose, UA: NEGATIVE mg/dL
Hgb urine dipstick: NEGATIVE
Ketones, ur: NEGATIVE mg/dL
Leukocytes,Ua: NEGATIVE
Nitrite: NEGATIVE
Protein, ur: NEGATIVE mg/dL
Specific Gravity, Urine: 1.004 — ABNORMAL LOW (ref 1.005–1.030)
pH: 9 — ABNORMAL HIGH (ref 5.0–8.0)

## 2021-01-31 LAB — METHOTREXATE
Methotrexate: 2.84
Methotrexate: 0.96
Methotrexate: 0.34

## 2021-01-31 LAB — MAGNESIUM: Magnesium: 2.3 mg/dL (ref 1.7–2.4)

## 2021-01-31 MED ORDER — POTASSIUM CHLORIDE 10 MEQ/100ML IV SOLN: 10.0000 meq | INTRAVENOUS | Status: DC

## 2021-01-31 MED ORDER — MAGNESIUM SULFATE 2 GM/50ML IV SOLN
2.0000 g | Freq: Once | INTRAVENOUS | Status: AC
  Administered 2021-01-31: 2 g via INTRAVENOUS
  Filled 2021-01-31: qty 50

## 2021-01-31 MED ORDER — POTASSIUM CHLORIDE 10 MEQ/100ML IV SOLN
10.0000 meq | INTRAVENOUS | Status: AC
  Administered 2021-01-31 (×6): 10 meq via INTRAVENOUS
  Filled 2021-01-31 (×4): qty 100

## 2021-01-31 MED ORDER — LEUCOVORIN CALCIUM INJECTION 100 MG
15.0000 mg/m2 | Freq: Four times a day (QID) | INTRAMUSCULAR | Status: AC
  Administered 2021-02-01 (×4): 26 mg via INTRAVENOUS
  Filled 2021-01-31 (×6): qty 1.3

## 2021-01-31 MED ORDER — ENOXAPARIN SODIUM 30 MG/0.3ML IJ SOSY
30.0000 mg | PREFILLED_SYRINGE | INTRAMUSCULAR | Status: DC
  Administered 2021-01-31 – 2021-02-03 (×4): 30 mg via SUBCUTANEOUS
  Filled 2021-01-31 (×4): qty 0.3

## 2021-01-31 NOTE — Progress Notes (Signed)
Methotrexate level and Leucovorin Dosing  Labs:  MTX level drawn at 0512 this morning = 0.34.  SCr = 1.82, increased. CrCl ~ 28 mL/min   Plan: Per leucovorin rescue nomogram, level was drawn ~86 hours after the start of the MTX infusion. Discussed with Dr. Marin Olp - will continue with leucovorin 15 mg/m2 (26 mg) IV q6h.  Next MTX ordered for 12/6 with AM labs.  Enoxaparin reduced to 30 mg subQ daily for CrCl < 30 mL/min per discussion with MD.  Lenis Noon, PharmD 01/31/21 12:36 PM

## 2021-01-31 NOTE — TOC Progression Note (Signed)
Transition of Care [facility]) - Progression Note    Patient Details  Name: Ariel [name] MRN: [id number] Date of Birth: Jun 16, 1955  Transition of Care [facility]) CM/SW Contact  Emmarose Klinke, Marjie Skiff, RN Phone Number: 01/31/2021, 12:18 PM  Clinical Narrative:    Spoke with pt and daughter Magda Paganini at bedside for dc planning. Magda Paganini expressing confusion on what disposition plan is moving forward. Per PT evaluation patient does not need PT follow up, therefore she would not qualify for SNF. This was expressed to [place]. Her mentation has improved some and she would probably still benefit from private caregivers in the home (they have had these before) or for an ALF situation. Both of these plans would be private pay and would need to be organized by the family. Magda Paganini encouraged to speak with family to decide which plan would be best and begin calling private duty sitter companies or to call ALFs in the area that they are interested in. It was expressed to Magda Paganini that if their plan is ALF, then there may be a lag period of time where family may need to stay with her at home until the ALF is able to take her. Magda Paganini states that she understands. TOC will continue to follow.   Expected Discharge Plan:  (to be determined) Barriers to Discharge: Continued Medical Work up  Expected Discharge Plan and Services Expected Discharge Plan:  (to be determined)   Discharge Planning Services: CM Consult  Living arrangements for the past 2 months: Single Family Home                                       Social Determinants of Health (SDOH) Interventions    Readmission Risk Interventions No flowsheet data found.

## 2021-01-31 NOTE — Progress Notes (Signed)
So far, everything seem to be going fairly well with respect to the high-dose methotrexate.  I do appreciate pharmacy's help in monitoring the methotrexate levels and adjusting to the leucovorin.  Her creatinine is 1.82.  Her LFTs are elevated.  Again this is all from the high-dose methotrexate.  She is urinating well.  Urine pH is 9.  Her potassium has been on the low side.  It is 3.1 this morning.  We had to give her some extra potassium.  Her ALT is 115 AST is 89.  The alkaline phosphatase is 84.  Her creatinine is still trending upward.  The LDH is 370.  Her white cell count is 5.6.  Hemoglobin 8.3.  Platelet count 269,000.  Her appetite is a bit decreased.  There is a little bit nausea but no vomiting.  She is having no diarrhea.  Vital signs show temperature 98.5.  Pulse 65.  Blood pressure 120/48.  Oral exam does not show any obvious mucositis.  Her lungs are clear.  Cardiac exam regular rate and rhythm.  Abdomen is soft.  Bowel sounds are present and active.  There is no guarding or rebound tenderness.  Extremity shows no clubbing, cyanosis or edema.  Neurological exam shows no focal deficits.  Cognitive wise, she seems to be doing a little bit better.  We had a nice talk this morning.  She still has a Actuary.  I think she slept maybe a little bit better last night.  In talking with her, no she would like to go home.  I will have to talk to her family about this.  I am not sure they are able to provide care for her at home.  This is going to be somewhat tenuous.  We will see what the methotrexate Eusebio Me list today.  We will have to monitor her renal function.  Her liver tests are improving.  She is urinating well so I feel that her renal function should improve.  I do appreciate everybody's help on 6 E.  The staff is doing a tremendous job.  [name], MD  Psalm 61:2

## 2021-02-01 ENCOUNTER — Encounter: Payer: Self-pay | Admitting: *Deleted

## 2021-02-01 ENCOUNTER — Other Ambulatory Visit: Payer: Self-pay | Admitting: Hematology & Oncology

## 2021-02-01 ENCOUNTER — Encounter (HOSPITAL_COMMUNITY): Payer: Self-pay | Admitting: Hematology & Oncology

## 2021-02-01 DIAGNOSIS — R400 Somnolence: Secondary | ICD-10-CM | POA: Diagnosis not present

## 2021-02-01 LAB — COMPREHENSIVE METABOLIC PANEL
ALT: 138 U/L — ABNORMAL HIGH (ref 0–44)
AST: 68 U/L — ABNORMAL HIGH (ref 15–41)
Albumin: 2.9 g/dL — ABNORMAL LOW (ref 3.5–5.0)
Alkaline Phosphatase: 90 U/L (ref 38–126)
Anion gap: 7 (ref 5–15)
BUN: 19 mg/dL (ref 8–23)
CO2: 36 mmol/L — ABNORMAL HIGH (ref 22–32)
Calcium: 8.9 mg/dL (ref 8.9–10.3)
Chloride: 93 mmol/L — ABNORMAL LOW (ref 98–111)
Creatinine, Ser: 2.07 mg/dL — ABNORMAL HIGH (ref 0.44–1.00)
GFR, Estimated: 26 mL/min — ABNORMAL LOW (ref >60)
Glucose, Bld: 145 mg/dL — ABNORMAL HIGH (ref 70–99)
Potassium: 3.4 mmol/L — ABNORMAL LOW (ref 3.5–5.1)
Sodium: 136 mmol/L (ref 135–145)
Total Bilirubin: 0.6 mg/dL (ref 0.3–1.2)
Total Protein: 5.6 g/dL — ABNORMAL LOW (ref 6.5–8.1)

## 2021-02-01 LAB — CBC WITH DIFFERENTIAL/PLATELET
Abs Immature Granulocytes: 0.04 10*3/uL (ref 0.00–0.07)
Basophils Absolute: 0 10*3/uL (ref 0.0–0.1)
Basophils Relative: 0 %
Eosinophils Absolute: 0.1 10*3/uL (ref 0.0–0.5)
Eosinophils Relative: 1 %
HCT: 27.1 % — ABNORMAL LOW (ref 36.0–46.0)
Hemoglobin: 8.5 g/dL — ABNORMAL LOW (ref 12.0–15.0)
Immature Granulocytes: 1 %
Lymphocytes Relative: 19 %
Lymphs Abs: 0.9 10*3/uL (ref 0.7–4.0)
MCH: 25.7 pg — ABNORMAL LOW (ref 26.0–34.0)
MCHC: 31.4 g/dL (ref 30.0–36.0)
MCV: 81.9 fL (ref 80.0–100.0)
Monocytes Absolute: 0.1 10*3/uL (ref 0.1–1.0)
Monocytes Relative: 1 %
Neutro Abs: 3.7 10*3/uL (ref 1.7–7.7)
Neutrophils Relative %: 78 %
Platelets: 255 10*3/uL (ref 150–400)
RBC: 3.31 MIL/uL — ABNORMAL LOW (ref 3.87–5.11)
RDW: 21.4 % — ABNORMAL HIGH (ref 11.5–15.5)
WBC: 4.8 10*3/uL (ref 4.0–10.5)
nRBC: 0 % (ref 0.0–0.2)

## 2021-02-01 LAB — URINALYSIS, ROUTINE W REFLEX MICROSCOPIC
Bilirubin Urine: NEGATIVE
Glucose, UA: NEGATIVE mg/dL
Hgb urine dipstick: NEGATIVE
Ketones, ur: NEGATIVE mg/dL
Leukocytes,Ua: NEGATIVE
Nitrite: NEGATIVE
Protein, ur: NEGATIVE mg/dL
Specific Gravity, Urine: 1.004 — ABNORMAL LOW (ref 1.005–1.030)
pH: 9 — ABNORMAL HIGH (ref 5.0–8.0)

## 2021-02-01 LAB — METHOTREXATE: Methotrexate: 0.19

## 2021-02-01 MED ORDER — POTASSIUM CHLORIDE 10 MEQ/100ML IV SOLN
INTRAVENOUS | Status: AC
  Filled 2021-02-01: qty 100

## 2021-02-01 MED ORDER — LEUCOVORIN CALCIUM INJECTION 100 MG
15.0000 mg/m2 | Freq: Four times a day (QID) | INTRAMUSCULAR | Status: AC
  Administered 2021-02-02 (×4): 26 mg via INTRAVENOUS
  Filled 2021-02-01 (×5): qty 1.3

## 2021-02-01 MED ORDER — POTASSIUM CHLORIDE 10 MEQ/100ML IV SOLN
10.0000 meq | INTRAVENOUS | Status: AC
  Administered 2021-02-01 (×3): 10 meq via INTRAVENOUS
  Filled 2021-02-01: qty 100

## 2021-02-01 NOTE — Progress Notes (Signed)
Methotrexate level and Leucovorin Dosing  Labs:  MTX level drawn at 0505 this morning = 0.19 SCr = 2.07, increasing. CrCl ~ 25 mL/min Urine pH = 9  Plan: Per leucovorin rescue nomogram, level was drawn ~110 hours after the start of the MTX infusion. Discussed with Dr. Marin Olp - will continue with leucovorin 15 mg/m2 (26 mg) IV q6h x 4 additional doses.  Next MTX ordered for 12/7 with AM labs.   Lenis Noon, PharmD 02/01/21 1:57 PM

## 2021-02-01 NOTE — Progress Notes (Signed)
Patient continues to be hospitalized, recovering from cycle 2 of her treatment. Current plans are to discharge patient to SNF. Will continue to follow for post discharge needs and follow up.   Oncology Nurse Navigator Documentation  Oncology Nurse Navigator Flowsheets 02/01/2021  Abnormal Finding Date -  Confirmed Diagnosis Date -  Phase of Treatment -  Chemotherapy Actual Start Date: -  Navigator Follow Up Date: 02/08/2021  Navigator Follow Up Reason: Appointment Review  Navigator Location [facility]  Navigator Encounter Type Appt/Treatment Plan Review  Telephone -  Treatment Initiated Date -  Patient Visit Type MedOnc  Treatment Phase Active Tx  Barriers/Navigation Needs Coordination of Care;Education  Education -  Interventions None Required  Acuity Level 2-Minimal Needs (1-2 Barriers Identified)  Referrals -  Coordination of Care -  Education Method -  Support Groups/Services Friends and Family  Time Spent with Patient 15

## 2021-02-01 NOTE — Progress Notes (Signed)
Ariel [name] seems to be doing very nicely.  Her cognitive function seems to be improving.  We had a really nice talk this morning.  Her liver tests are getting better.  However, her renal function continues to slow down.  Her creatinine is now 2.1.  She has alkaline urine.  I think that the methotrexate is getting out of her system.  Pharmacy is doing a great job in monitoring the methotrexate levels.  We do not have the level back yet today.  She has had no vomiting.  She had a little nausea.  She is try to increase what she takes in orally.  She has had no diarrhea.  There is been no problems with bleeding.  There is no pain.  I spoke with Ariel [name] this morning.  I spoke with her children last night.  We are in agreement that we probably need to get her to assisted living for a little bit so that she can continue to recover.  There is just not help at home to be able to help her 24 hours a day right now.  We will see how we can look into getting assisted living arranged for her.  She has not close to even being ready for discharge.  We have to get the methotrexate levels down.  We had to make sure that her renal function improves.  She is walking.  She slept little better last night.  Her potassium is 3.4.  I will give her some additional potassium today.  Her SGPT is 138.  SGOT 68.  Her white cell count is 4.8.  Hemoglobin 8.5.  Platelet count 255,000.  I am still trying to hold off on transfusing her.  Her vital signs show temperature of 98.  Pulse 62.  Blood pressure 122/63.  Her oral exam shows no obvious mucositis.  She is rinsing her mouth out.  Her lungs are clear.  Cardiac exam regular rate and rhythm.  Abdomen is soft.  Bowel sounds are active.  Extremity shows good range of motion of all of her joints.  She has good strength in all of her joints.  Neurological exam shows no focal neurological deficits.  We will have to see what the methotrexate level is.  She continues on the  leucovorin rescue.  We really have to manage her renal function.  This is going to be key.  Her urine is alkaline.  The pH yesterday was 9.0.  I do appreciate pharmacy's help with the methotrexate.  I do appreciate how much they are really providing there expertise.  I know all the staff on [address] are doing a fantastic job with her.    [name], MD  Lurena Joiner 1:32

## 2021-02-02 ENCOUNTER — Encounter (HOSPITAL_COMMUNITY): Payer: Self-pay | Admitting: Hematology & Oncology

## 2021-02-02 ENCOUNTER — Ambulatory Visit: Payer: Medicare Other

## 2021-02-02 DIAGNOSIS — R400 Somnolence: Secondary | ICD-10-CM | POA: Diagnosis not present

## 2021-02-02 LAB — COMPREHENSIVE METABOLIC PANEL
ALT: 111 U/L — ABNORMAL HIGH (ref 0–44)
AST: 48 U/L — ABNORMAL HIGH (ref 15–41)
Albumin: 3 g/dL — ABNORMAL LOW (ref 3.5–5.0)
Alkaline Phosphatase: 88 U/L (ref 38–126)
Anion gap: 9 (ref 5–15)
BUN: 20 mg/dL (ref 8–23)
CO2: 38 mmol/L — ABNORMAL HIGH (ref 22–32)
Calcium: 8.5 mg/dL — ABNORMAL LOW (ref 8.9–10.3)
Chloride: 91 mmol/L — ABNORMAL LOW (ref 98–111)
Creatinine, Ser: 1.77 mg/dL — ABNORMAL HIGH (ref 0.44–1.00)
GFR, Estimated: 31 mL/min — ABNORMAL LOW (ref >60)
Glucose, Bld: 220 mg/dL — ABNORMAL HIGH (ref 70–99)
Potassium: 2.9 mmol/L — ABNORMAL LOW (ref 3.5–5.1)
Sodium: 138 mmol/L (ref 135–145)
Total Bilirubin: 0.4 mg/dL (ref 0.3–1.2)
Total Protein: 5.9 g/dL — ABNORMAL LOW (ref 6.5–8.1)

## 2021-02-02 LAB — URINALYSIS, ROUTINE W REFLEX MICROSCOPIC
Bilirubin Urine: NEGATIVE
Glucose, UA: NEGATIVE mg/dL
Hgb urine dipstick: NEGATIVE
Ketones, ur: NEGATIVE mg/dL
Leukocytes,Ua: NEGATIVE
Nitrite: NEGATIVE
Protein, ur: NEGATIVE mg/dL
Specific Gravity, Urine: 1.004 — ABNORMAL LOW (ref 1.005–1.030)
pH: 9 — ABNORMAL HIGH (ref 5.0–8.0)

## 2021-02-02 LAB — CBC WITH DIFFERENTIAL/PLATELET
Abs Immature Granulocytes: 0.03 10*3/uL (ref 0.00–0.07)
Basophils Absolute: 0 10*3/uL (ref 0.0–0.1)
Basophils Relative: 0 %
Eosinophils Absolute: 0.1 10*3/uL (ref 0.0–0.5)
Eosinophils Relative: 4 %
HCT: 25.7 % — ABNORMAL LOW (ref 36.0–46.0)
Hemoglobin: 8.1 g/dL — ABNORMAL LOW (ref 12.0–15.0)
Immature Granulocytes: 1 %
Lymphocytes Relative: 24 %
Lymphs Abs: 0.8 10*3/uL (ref 0.7–4.0)
MCH: 26 pg (ref 26.0–34.0)
MCHC: 31.5 g/dL (ref 30.0–36.0)
MCV: 82.4 fL (ref 80.0–100.0)
Monocytes Absolute: 0.1 10*3/uL (ref 0.1–1.0)
Monocytes Relative: 2 %
Neutro Abs: 2.3 10*3/uL (ref 1.7–7.7)
Neutrophils Relative %: 69 %
Platelets: 218 10*3/uL (ref 150–400)
RBC: 3.12 MIL/uL — ABNORMAL LOW (ref 3.87–5.11)
RDW: 21.5 % — ABNORMAL HIGH (ref 11.5–15.5)
WBC: 3.4 10*3/uL — ABNORMAL LOW (ref 4.0–10.5)
nRBC: 0 % (ref 0.0–0.2)

## 2021-02-02 LAB — METHOTREXATE: Methotrexate: 0.12

## 2021-02-02 MED ORDER — POTASSIUM CHLORIDE 10 MEQ/50ML IV SOLN
10.0000 meq | INTRAVENOUS | Status: AC
  Administered 2021-02-02 – 2021-02-03 (×6): 10 meq via INTRAVENOUS
  Filled 2021-02-02 (×6): qty 50

## 2021-02-02 MED ORDER — LEUCOVORIN CALCIUM INJECTION 100 MG
15.0000 mg/m2 | Freq: Four times a day (QID) | INTRAMUSCULAR | Status: DC
  Administered 2021-02-03 (×2): 26 mg via INTRAVENOUS
  Filled 2021-02-02 (×4): qty 1.3

## 2021-02-02 NOTE — Progress Notes (Signed)
Methotrexate level and Leucovorin Dosing  Labs:  MTX level drawn at 0605 this morning = 0.12 CMP has been ordered, not yet available  Urine pH = 9  Plan: Per leucovorin rescue nomogram, level was drawn ~124 hours after the start of the MTX infusion. Discussed with Dr. Marin Olp - will continue with leucovorin 15 mg/m2 (26 mg) IV q6h x 4 additional doses.  Next MTX, CMP ordered for 12/7 with AM labs.   Royetta Asal, PharmD, BCPS 02/02/2021 11:56 AM

## 2021-02-02 NOTE — Progress Notes (Signed)
So far, everything is doing pretty well.  Her methotrexate level is coming down.  Hopefully, it will be to the point where we can stop the leucovorin.  I do not see any labs back yet today.  We will have to see what her kidney function is.  She is urinating pretty well.  Her urine is alkaline.  She is having no problems with nausea or vomiting.  Her cognitive function seems to be coming back to baseline.  Again, I suspect that steroids probably were the source for her confusion.  Her hemoglobin is 8.1.  I am still going to hold on transfusing her for right now.  She is walking.  We are still trying to see what we can do with respect to finding a assisted living facility for her temporarily.  Her vital signs show temperature 98.3.  Pulse 84.  Blood pressure 135/50.  Her lungs are clear.  Cardiac exam regular rate and rhythm.  Abdomen is soft.  Bowel sounds are present.  There is no guarding or rebound tenderness.  Extremity shows no clubbing, cyanosis or edema.  Neurological exam shows no focal neurological deficits.  We will see what the methotrexate level is.  Again, we will hopefully be able to stop the leucovorin if the methotrexate level is down.  I will have to see what her renal function is.  This will be critical.  She is urinating quite well so her urinary ability is not an issue.  Again we will have to watch her hemoglobin closely.  Again I will hold on transfusing her.  She is asymptomatic.  I do appreciate the incredible care that she is getting from the staff on 6 [address].  [name], MD  Philippians 4:13

## 2021-02-03 ENCOUNTER — Encounter (HOSPITAL_COMMUNITY): Payer: Self-pay | Admitting: Hematology & Oncology

## 2021-02-03 DIAGNOSIS — R400 Somnolence: Secondary | ICD-10-CM | POA: Diagnosis not present

## 2021-02-03 LAB — CBC WITH DIFFERENTIAL/PLATELET
Abs Immature Granulocytes: 0.02 10*3/uL (ref 0.00–0.07)
Basophils Absolute: 0 10*3/uL (ref 0.0–0.1)
Basophils Relative: 0 %
Eosinophils Absolute: 0.2 10*3/uL (ref 0.0–0.5)
Eosinophils Relative: 4 %
HCT: 25.3 % — ABNORMAL LOW (ref 36.0–46.0)
Hemoglobin: 8 g/dL — ABNORMAL LOW (ref 12.0–15.0)
Immature Granulocytes: 1 %
Lymphocytes Relative: 30 %
Lymphs Abs: 1.1 10*3/uL (ref 0.7–4.0)
MCH: 26.4 pg (ref 26.0–34.0)
MCHC: 31.6 g/dL (ref 30.0–36.0)
MCV: 83.5 fL (ref 80.0–100.0)
Monocytes Absolute: 0.1 10*3/uL (ref 0.1–1.0)
Monocytes Relative: 4 %
Neutro Abs: 2.3 10*3/uL (ref 1.7–7.7)
Neutrophils Relative %: 61 %
Platelets: 188 10*3/uL (ref 150–400)
RBC: 3.03 MIL/uL — ABNORMAL LOW (ref 3.87–5.11)
RDW: 21.2 % — ABNORMAL HIGH (ref 11.5–15.5)
WBC: 3.7 10*3/uL — ABNORMAL LOW (ref 4.0–10.5)
nRBC: 0 % (ref 0.0–0.2)

## 2021-02-03 LAB — COMPREHENSIVE METABOLIC PANEL
ALT: 91 U/L — ABNORMAL HIGH (ref 0–44)
AST: 34 U/L (ref 15–41)
Albumin: 3 g/dL — ABNORMAL LOW (ref 3.5–5.0)
Alkaline Phosphatase: 90 U/L (ref 38–126)
Anion gap: 7 (ref 5–15)
BUN: 19 mg/dL (ref 8–23)
CO2: 36 mmol/L — ABNORMAL HIGH (ref 22–32)
Calcium: 8.9 mg/dL (ref 8.9–10.3)
Chloride: 96 mmol/L — ABNORMAL LOW (ref 98–111)
Creatinine, Ser: 1.75 mg/dL — ABNORMAL HIGH (ref 0.44–1.00)
GFR, Estimated: 32 mL/min — ABNORMAL LOW (ref >60)
Glucose, Bld: 127 mg/dL — ABNORMAL HIGH (ref 70–99)
Potassium: 3.5 mmol/L (ref 3.5–5.1)
Sodium: 139 mmol/L (ref 135–145)
Total Bilirubin: 0.2 mg/dL — ABNORMAL LOW (ref 0.3–1.2)
Total Protein: 5.9 g/dL — ABNORMAL LOW (ref 6.5–8.1)

## 2021-02-03 LAB — URINALYSIS, ROUTINE W REFLEX MICROSCOPIC
Bilirubin Urine: NEGATIVE
Glucose, UA: NEGATIVE mg/dL
Hgb urine dipstick: NEGATIVE
Ketones, ur: NEGATIVE mg/dL
Leukocytes,Ua: NEGATIVE
Nitrite: NEGATIVE
Protein, ur: NEGATIVE mg/dL
Specific Gravity, Urine: 1.004 — ABNORMAL LOW (ref 1.005–1.030)
pH: 9 — ABNORMAL HIGH (ref 5.0–8.0)

## 2021-02-03 LAB — PREPARE RBC (CROSSMATCH)

## 2021-02-03 LAB — METHOTREXATE: Methotrexate: 0.08

## 2021-02-03 LAB — LACTATE DEHYDROGENASE: LDH: 298 U/L — ABNORMAL HIGH (ref 98–192)

## 2021-02-03 LAB — ABO/RH: ABO/RH(D): A POS

## 2021-02-03 MED ORDER — FUROSEMIDE 10 MG/ML IJ SOLN
20.0000 mg | Freq: Once | INTRAMUSCULAR | Status: AC
  Administered 2021-02-03: 20 mg via INTRAVENOUS
  Filled 2021-02-03: qty 2

## 2021-02-03 MED ORDER — FUROSEMIDE 10 MG/ML IJ SOLN
20.0000 mg | Freq: Once | INTRAMUSCULAR | Status: AC
  Administered 2021-02-03: 20 mg via INTRAVENOUS

## 2021-02-03 MED ORDER — SODIUM CHLORIDE 0.9% IV SOLUTION: Freq: Once | INTRAVENOUS | Status: AC

## 2021-02-03 MED ORDER — FUROSEMIDE 10 MG/ML IJ SOLN
INTRAMUSCULAR | Status: AC
  Filled 2021-02-03: qty 2

## 2021-02-03 NOTE — Progress Notes (Signed)
Methotrexate level and Leucovorin Dosing  Labs:  MTX level drawn at 0554 this morning = 0.08 Scr 1.75, trending down  Urine pH = 9  Plan:    Discussed with Dr. Marin Olp - can discontinue leucovorin   Royetta Asal, PharmD, BCPS 02/03/2021 11:15 AM

## 2021-02-03 NOTE — Plan of Care (Signed)

## 2021-02-03 NOTE — Progress Notes (Signed)
Everything continues to improve.  I would think that her methotrexate level should be to where we can stop the leucovorin rescue.  Her hemoglobin is 8.  I think we are going to have to transfuse her.  I think this will be helpful for her.  Her renal function is improving.  Her creatinine is 1.75 now.  Her white cell count is 3.7.  She is not neutropenic.  I will hold off on Neupogen for her.  She is walking.  She is eating.  She is urinating.  There is no diarrhea.  In fact, she may have little bit of constipation.  There is no cough or shortness of breath.  She does have a ulceration on the left buccal mucosa.  There is been no issues with fever.  Her mental state seems to be almost baseline now.  Her vital signs show temperature of 99.1.  Pulse 79.  Blood pressure 162/88.  Her oral exam does show the ulceration in the left buccal mucosa.  Her lungs are clear.  Cardiac exam regular rate and rhythm.  Abdomen is soft.  Bowel sounds are present.  There is no guarding or rebound tenderness.  Strongly shows minimal edema in her legs.  She has good range of motion of her joints.  Neurological exam shows no focal neurological deficits.  Again, we will transfuse her 2 units of blood.  I talked to her about this.  I went over the side effects.  She is agreeable to the transfusion.  Again, I think this will help her out.  We will see what the methotrexate level is.  I would like to believe that it will be less than 0.1 so we can stop the leucovorin rescue.  We are still try to find a assisted living facility for her.  Hopefully, case management can help [place] with this.  I do appreciate the outstanding care she is getting from all the staff on [address].  [name], MD  Romans 8:28

## 2021-02-04 LAB — CBC WITH DIFFERENTIAL/PLATELET
Abs Immature Granulocytes: 0.07 10*3/uL (ref 0.00–0.07)
Basophils Absolute: 0 10*3/uL (ref 0.0–0.1)
Basophils Relative: 0 %
Eosinophils Absolute: 0.2 10*3/uL (ref 0.0–0.5)
Eosinophils Relative: 3 %
HCT: 32.3 % — ABNORMAL LOW (ref 36.0–46.0)
Hemoglobin: 10.4 g/dL — ABNORMAL LOW (ref 12.0–15.0)
Immature Granulocytes: 1 %
Lymphocytes Relative: 15 %
Lymphs Abs: 1 10*3/uL (ref 0.7–4.0)
MCH: 26.9 pg (ref 26.0–34.0)
MCHC: 32.2 g/dL (ref 30.0–36.0)
MCV: 83.5 fL (ref 80.0–100.0)
Monocytes Absolute: 0.4 10*3/uL (ref 0.1–1.0)
Monocytes Relative: 5 %
Neutro Abs: 5 10*3/uL (ref 1.7–7.7)
Neutrophils Relative %: 76 %
Platelets: 169 10*3/uL (ref 150–400)
RBC: 3.87 MIL/uL (ref 3.87–5.11)
RDW: 20.3 % — ABNORMAL HIGH (ref 11.5–15.5)
WBC: 6.7 10*3/uL (ref 4.0–10.5)
nRBC: 0 % (ref 0.0–0.2)

## 2021-02-04 LAB — COMPREHENSIVE METABOLIC PANEL
ALT: 71 U/L — ABNORMAL HIGH (ref 0–44)
AST: 29 U/L (ref 15–41)
Albumin: 3.3 g/dL — ABNORMAL LOW (ref 3.5–5.0)
Alkaline Phosphatase: 91 U/L (ref 38–126)
Anion gap: 10 (ref 5–15)
BUN: 20 mg/dL (ref 8–23)
CO2: 35 mmol/L — ABNORMAL HIGH (ref 22–32)
Calcium: 9.1 mg/dL (ref 8.9–10.3)
Chloride: 93 mmol/L — ABNORMAL LOW (ref 98–111)
Creatinine, Ser: 1.72 mg/dL — ABNORMAL HIGH (ref 0.44–1.00)
GFR, Estimated: 32 mL/min — ABNORMAL LOW (ref >60)
Glucose, Bld: 164 mg/dL — ABNORMAL HIGH (ref 70–99)
Potassium: 3.1 mmol/L — ABNORMAL LOW (ref 3.5–5.1)
Sodium: 138 mmol/L (ref 135–145)
Total Bilirubin: 0.5 mg/dL (ref 0.3–1.2)
Total Protein: 6.3 g/dL — ABNORMAL LOW (ref 6.5–8.1)

## 2021-02-04 LAB — BPAM RBC
Blood Product Expiration Date: 202212262359
Blood Product Expiration Date: 202212282359
ISSUE DATE / TIME: 202212081232
ISSUE DATE / TIME: 202212081559
Unit Type and Rh: 6200
Unit Type and Rh: 6200

## 2021-02-04 LAB — TYPE AND SCREEN
ABO/RH(D): A POS
Antibody Screen: NEGATIVE
Unit division: 0
Unit division: 0

## 2021-02-04 LAB — METHOTREXATE: Methotrexate: 0.07

## 2021-02-04 LAB — LACTATE DEHYDROGENASE: LDH: 292 U/L — ABNORMAL HIGH (ref 98–192)

## 2021-02-04 MED ORDER — POTASSIUM CHLORIDE 10 MEQ/100ML IV SOLN
10.0000 meq | INTRAVENOUS | Status: AC
  Administered 2021-02-04 (×4): 10 meq via INTRAVENOUS
  Filled 2021-02-04: qty 100
  Filled 2021-02-04 (×2): qty 50
  Filled 2021-02-04: qty 100
  Filled 2021-02-04: qty 50
  Filled 2021-02-04: qty 100
  Filled 2021-02-04: qty 50
  Filled 2021-02-04: qty 100

## 2021-02-04 NOTE — TOC Progression Note (Signed)
Transition of Care [facility]) - Progression Note    Patient Details  Name: Ariel [name] MRN: [id number] Date of Birth: 11-11-1955  Transition of Care [facility]) CM/SW Contact  Yaminah Clayborn, Marjie Skiff, RN Phone Number: 02/04/2021, 12:46 PM  Clinical Narrative:    Was asked again to speak with daughter Magda Paganini about placing pt at ALF. Called Magda Paganini to reiterate the conversation that we had on Monday. [facility] department are unable to do long term care placement. Magda Paganini states that she thought Dr.Ennever talked to me about it. Explained to Dr.Ennever that this is not something that is done in the hospital. Pt is walking very well and her confusion has cleared so she would not qualify for SNF. TOC will sign off at this time.   Expected Discharge Plan:  (to be determined) Barriers to Discharge: Continued Medical Work up  Expected Discharge Plan and Services Expected Discharge Plan:  (to be determined)   Discharge Planning Services: CM Consult Post Acute Care Choice: [place] arrangements for the past 2 months: Single Family Home                       Social Determinants of Health (SDOH) Interventions    Readmission Risk Interventions No flowsheet data found.

## 2021-02-04 NOTE — Progress Notes (Signed)
So far, everything is going incredibly well.  Her white cell count is back up.  She is off the leucovorin now.  We no longer need to check her urine pH.  We no longer need to check methotrexate levels.  She got 2 units of blood yesterday.  Hemoglobin is 10.4.  Her renal function is improving.  Her creatinine is 1.72.  Her potassium is 3.1.  I did give her a little bit extra potassium.  I hopefully will not have to give her any Neulasta or Neupogen.  Her appetite is good.  There is no nausea or vomiting.  She took some stool softener yesterday.  She has had no rashes.  The mouth sores seem to be improving.  Her vital signs are temperature 98.3.  Pulse 87.  Blood pressure 102/82.  Her lungs are clear.  Cardiac exam regular rate and rhythm.  Abdomen is soft.  Bowel sounds are present.  There is no guarding or rebound tenderness.  Extremity shows no clubbing, cyanosis or edema.  Neurological exam shows no focal neurological deficits.  She really seems to be back to her baseline mental state.  We still need to find a place for her.  Her family just has not available to take care of her at home.  Am still awaiting the case manager to try to help [place] out and find placement for her.  I do appreciate the great care she is getting from all the staff up on 6 [address].  [name], MD  Lurena Joiner 1:37

## 2021-02-05 LAB — COMPREHENSIVE METABOLIC PANEL
ALT: 57 U/L — ABNORMAL HIGH (ref 0–44)
AST: 22 U/L (ref 15–41)
Albumin: 3.2 g/dL — ABNORMAL LOW (ref 3.5–5.0)
Alkaline Phosphatase: 87 U/L (ref 38–126)
Anion gap: 8 (ref 5–15)
BUN: 31 mg/dL — ABNORMAL HIGH (ref 8–23)
CO2: 34 mmol/L — ABNORMAL HIGH (ref 22–32)
Calcium: 9.4 mg/dL (ref 8.9–10.3)
Chloride: 96 mmol/L — ABNORMAL LOW (ref 98–111)
Creatinine, Ser: 1.54 mg/dL — ABNORMAL HIGH (ref 0.44–1.00)
GFR, Estimated: 37 mL/min — ABNORMAL LOW (ref >60)
Glucose, Bld: 105 mg/dL — ABNORMAL HIGH (ref 70–99)
Potassium: 3.6 mmol/L (ref 3.5–5.1)
Sodium: 138 mmol/L (ref 135–145)
Total Bilirubin: 0.4 mg/dL (ref 0.3–1.2)
Total Protein: 6.4 g/dL — ABNORMAL LOW (ref 6.5–8.1)

## 2021-02-05 LAB — CBC WITH DIFFERENTIAL/PLATELET
Abs Immature Granulocytes: 0.03 10*3/uL (ref 0.00–0.07)
Basophils Absolute: 0 10*3/uL (ref 0.0–0.1)
Basophils Relative: 0 %
Eosinophils Absolute: 0.2 10*3/uL (ref 0.0–0.5)
Eosinophils Relative: 2 %
HCT: 33.6 % — ABNORMAL LOW (ref 36.0–46.0)
Hemoglobin: 10.8 g/dL — ABNORMAL LOW (ref 12.0–15.0)
Immature Granulocytes: 0 %
Lymphocytes Relative: 23 %
Lymphs Abs: 1.5 10*3/uL (ref 0.7–4.0)
MCH: 27.3 pg (ref 26.0–34.0)
MCHC: 32.1 g/dL (ref 30.0–36.0)
MCV: 84.8 fL (ref 80.0–100.0)
Monocytes Absolute: 0.6 10*3/uL (ref 0.1–1.0)
Monocytes Relative: 8 %
Neutro Abs: 4.5 10*3/uL (ref 1.7–7.7)
Neutrophils Relative %: 67 %
Platelets: 191 10*3/uL (ref 150–400)
RBC: 3.96 MIL/uL (ref 3.87–5.11)
RDW: 21.2 % — ABNORMAL HIGH (ref 11.5–15.5)
WBC: 6.8 10*3/uL (ref 4.0–10.5)
nRBC: 0 % (ref 0.0–0.2)

## 2021-02-05 LAB — LACTATE DEHYDROGENASE: LDH: 293 U/L — ABNORMAL HIGH (ref 98–192)

## 2021-02-05 NOTE — Progress Notes (Signed)
She seems to be doing pretty well right now.  She got through the chemotherapy.  She seems to be doing a bit better mentally.  She does tend to go off on a tangent every now and then.  We now are trying to get her to some type of assisted living.  She would not qualify for skilled nursing.  Her family just is not able to take care of her at home.  There really is no one available or close vital take care of her.  Her labs are doing well.  Her renal function is improving.  Her creatinine is 1.54.  She is not neutropenic.  Hopefully she will not become neutropenic.  She seems to be eating pretty well.  She has had no nausea or vomiting.  She does have a mouth sore on the left buccal mucosa which appears to be improving.  She has had no fever.  There is no bleeding.  Her BUN is 31 creatinine 1.54.  Her potassium is 3.6.  Her LDH is 293.  Her white cell count 6.8.  Hemoglobin 10.8.  Platelet count 191,000.  Her vital signs show temperature 98.3.  Pulse 76.  Blood pressure 141/71.  Her head neck exam does show the area of mucositis in the left buccal mucosa.  There is no adenopathy in the neck.  Lungs are clear bilaterally.  Cardiac exam regular rate and rhythm.  Abdomen is soft.  She has decent bowel sounds.  There is no fluid wave.  Extremity shows no clubbing, cyanosis or edema.  Neurological exam shows no focal neurological deficits.  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  We have given her 2 different courses of chemotherapy.  Her mental state is improving.  This is a slow process.  She does appear to be more oriented.  Again, we will try to have find somewhere for her to stay.  Hopefully we can get this sorted out and get her to a facility next week.  I am very grateful for the staff on 6 [address].  I think that they have done a fantastic job.  [name], MD  Romans 5:3-3

## 2021-02-06 DIAGNOSIS — C8510 Unspecified B-cell lymphoma, unspecified site: Secondary | ICD-10-CM | POA: Diagnosis not present

## 2021-02-06 LAB — COMPREHENSIVE METABOLIC PANEL
ALT: 48 U/L — ABNORMAL HIGH (ref 0–44)
AST: 23 U/L (ref 15–41)
Albumin: 3.3 g/dL — ABNORMAL LOW (ref 3.5–5.0)
Alkaline Phosphatase: 84 U/L (ref 38–126)
Anion gap: 10 (ref 5–15)
BUN: 32 mg/dL — ABNORMAL HIGH (ref 8–23)
CO2: 33 mmol/L — ABNORMAL HIGH (ref 22–32)
Calcium: 9.3 mg/dL (ref 8.9–10.3)
Chloride: 95 mmol/L — ABNORMAL LOW (ref 98–111)
Creatinine, Ser: 1.51 mg/dL — ABNORMAL HIGH (ref 0.44–1.00)
GFR, Estimated: 38 mL/min — ABNORMAL LOW (ref >60)
Glucose, Bld: 137 mg/dL — ABNORMAL HIGH (ref 70–99)
Potassium: 3.4 mmol/L — ABNORMAL LOW (ref 3.5–5.1)
Sodium: 138 mmol/L (ref 135–145)
Total Bilirubin: 0.3 mg/dL (ref 0.3–1.2)
Total Protein: 6.3 g/dL — ABNORMAL LOW (ref 6.5–8.1)

## 2021-02-06 LAB — CBC WITH DIFFERENTIAL/PLATELET
Abs Immature Granulocytes: 0.03 10*3/uL (ref 0.00–0.07)
Basophils Absolute: 0 10*3/uL (ref 0.0–0.1)
Basophils Relative: 0 %
Eosinophils Absolute: 0.2 10*3/uL (ref 0.0–0.5)
Eosinophils Relative: 2 %
HCT: 33 % — ABNORMAL LOW (ref 36.0–46.0)
Hemoglobin: 10.6 g/dL — ABNORMAL LOW (ref 12.0–15.0)
Immature Granulocytes: 1 %
Lymphocytes Relative: 23 %
Lymphs Abs: 1.5 10*3/uL (ref 0.7–4.0)
MCH: 27.5 pg (ref 26.0–34.0)
MCHC: 32.1 g/dL (ref 30.0–36.0)
MCV: 85.5 fL (ref 80.0–100.0)
Monocytes Absolute: 0.7 10*3/uL (ref 0.1–1.0)
Monocytes Relative: 12 %
Neutro Abs: 3.9 10*3/uL (ref 1.7–7.7)
Neutrophils Relative %: 62 %
Platelets: 196 10*3/uL (ref 150–400)
RBC: 3.86 MIL/uL — ABNORMAL LOW (ref 3.87–5.11)
RDW: 21.3 % — ABNORMAL HIGH (ref 11.5–15.5)
WBC: 6.3 10*3/uL (ref 4.0–10.5)
nRBC: 0 % (ref 0.0–0.2)

## 2021-02-06 LAB — LACTATE DEHYDROGENASE: LDH: 282 U/L — ABNORMAL HIGH (ref 98–192)

## 2021-02-06 NOTE — Progress Notes (Signed)
Subjective: The patient is seen and examined today.  She is feeling fine with no concerning complaints.  She was walking when I went to see her.  She denied having any current chest pain, shortness of breath, cough or hemoptysis.  She has no nausea, vomiting, diarrhea or constipation.  She has no headache or visual changes.  She has no recent weight loss or night sweats.  She completed her course of systemic chemotherapy and tolerated it fairly well.  She is awaiting skilled nursing facility for discharge.  Objective: Vital signs in last 24 hours: Temp:  [97.8 F (36.6 C)-98.2 F (36.8 C)] 98.2 F (36.8 C) (12/11 0525) Pulse Rate:  [74-80] 74 (12/11 0525) Resp:  [16-17] 16 (12/11 0525) BP: (143-146)/(66-80) 143/66 (12/11 0525) SpO2:  [96 %-98 %] 98 % (12/11 0525)  Intake/Output from previous day: 12/10 0701 - 12/11 0700 In: 1208.1 [I.V.:1208.1] Out: -  Intake/Output this shift: No intake/output data recorded.  General appearance: alert, cooperative, fatigued, and no distress Resp: clear to auscultation bilaterally and normal percussion bilaterally Cardio: regular rate and rhythm, S1, S2 normal, no murmur, click, rub or gallop GI: soft, non-tender; bowel sounds normal; no masses,  no organomegaly Extremities: extremities normal, atraumatic, no cyanosis or edema  Lab Results:  Recent Labs    02/05/21 0506 02/06/21 0546  WBC 6.8 6.3  HGB 10.8* 10.6*  HCT 33.6* 33.0*  PLT 191 196   BMET Recent Labs    02/05/21 0506 02/06/21 0546  NA 138 138  K 3.6 3.4*  CL 96* 95*  CO2 34* 33*  GLUCOSE 105* 137*  BUN 31* 32*  CREATININE 1.54* 1.51*  CALCIUM 9.4 9.3    Studies/Results: No results found.  Medications: I have reviewed the patient's current medications.   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  She is status post 1 cycle of systemic chemotherapy with R-ICE completed  on January 07, 2021.  She also received a cycle of R-MTV and tolerated this treatment well with no concerning adverse effects. She is feeling very well and ready for discharge but still awaiting discharge for skilled nursing facility since the patient lives alone and has no great social support. Her blood work today showed no significant change compared to the last few days. Will continue to monitor the patient closely until discharge. Dr. Marin Olp will resume her care tomorrow.   LOS: 23 days    Ariel [name] 02/06/2021

## 2021-02-07 ENCOUNTER — Telehealth: Payer: Self-pay | Admitting: Neurology

## 2021-02-07 ENCOUNTER — Telehealth: Payer: Self-pay | Admitting: Family

## 2021-02-07 ENCOUNTER — Inpatient Hospital Stay (HOSPITAL_COMMUNITY): Payer: Medicare Other

## 2021-02-07 ENCOUNTER — Ambulatory Visit: Payer: Medicare Other | Admitting: Family

## 2021-02-07 DIAGNOSIS — R42 Dizziness and giddiness: Secondary | ICD-10-CM

## 2021-02-07 LAB — CBC WITH DIFFERENTIAL/PLATELET
Abs Immature Granulocytes: 0.04 10*3/uL (ref 0.00–0.07)
Basophils Absolute: 0 10*3/uL (ref 0.0–0.1)
Basophils Relative: 0 %
Eosinophils Absolute: 0.2 10*3/uL (ref 0.0–0.5)
Eosinophils Relative: 3 %
HCT: 33.1 % — ABNORMAL LOW (ref 36.0–46.0)
Hemoglobin: 10.5 g/dL — ABNORMAL LOW (ref 12.0–15.0)
Immature Granulocytes: 1 %
Lymphocytes Relative: 25 %
Lymphs Abs: 1.5 10*3/uL (ref 0.7–4.0)
MCH: 27.3 pg (ref 26.0–34.0)
MCHC: 31.7 g/dL (ref 30.0–36.0)
MCV: 86.2 fL (ref 80.0–100.0)
Monocytes Absolute: 0.7 10*3/uL (ref 0.1–1.0)
Monocytes Relative: 12 %
Neutro Abs: 3.4 10*3/uL (ref 1.7–7.7)
Neutrophils Relative %: 59 %
Platelets: 203 10*3/uL (ref 150–400)
RBC: 3.84 MIL/uL — ABNORMAL LOW (ref 3.87–5.11)
RDW: 21.7 % — ABNORMAL HIGH (ref 11.5–15.5)
WBC: 5.8 10*3/uL (ref 4.0–10.5)
nRBC: 0 % (ref 0.0–0.2)

## 2021-02-07 LAB — COMPREHENSIVE METABOLIC PANEL
ALT: 43 U/L (ref 0–44)
AST: 24 U/L (ref 15–41)
Albumin: 3.2 g/dL — ABNORMAL LOW (ref 3.5–5.0)
Alkaline Phosphatase: 86 U/L (ref 38–126)
Anion gap: 10 (ref 5–15)
BUN: 29 mg/dL — ABNORMAL HIGH (ref 8–23)
CO2: 34 mmol/L — ABNORMAL HIGH (ref 22–32)
Calcium: 8.9 mg/dL (ref 8.9–10.3)
Chloride: 94 mmol/L — ABNORMAL LOW (ref 98–111)
Creatinine, Ser: 1.44 mg/dL — ABNORMAL HIGH (ref 0.44–1.00)
GFR, Estimated: 40 mL/min — ABNORMAL LOW (ref >60)
Glucose, Bld: 200 mg/dL — ABNORMAL HIGH (ref 70–99)
Potassium: 3.1 mmol/L — ABNORMAL LOW (ref 3.5–5.1)
Sodium: 138 mmol/L (ref 135–145)
Total Bilirubin: 0.3 mg/dL (ref 0.3–1.2)
Total Protein: 6.1 g/dL — ABNORMAL LOW (ref 6.5–8.1)

## 2021-02-07 LAB — LACTATE DEHYDROGENASE: LDH: 241 U/L — ABNORMAL HIGH (ref 98–192)

## 2021-02-07 IMAGING — MR MR HEAD WO/W CM
13 series · 48 of 48 positions shown · IV contrast (gadavist)
Comparison: [DATE].  [DATE].  [DATE].

CLINICAL DATA: Cerebral basal spasm.  CNS lymphoma.

EXAM:
MRI HEAD WITHOUT AND WITH CONTRAST
TECHNIQUE: Multiplanar, multiecho pulse sequences of the brain and surrounding
structures were obtained without and with intravenous contrast.
CONTRAST:  6mL GADAVIST GADOBUTROL 1 MMOL/ML IV SOLN

[Series 5: DWI · axial · 3.0mm · 1.36mm/px · z∈[-44,+108]mm · 5 of 104 slices shown (1 of 2)]
[im 1/104]
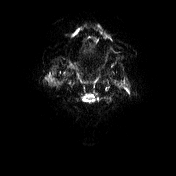
[im 26/104]
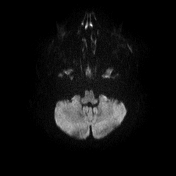
[im 52/104]
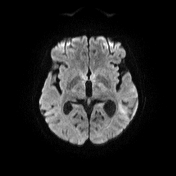
[im 78/104]
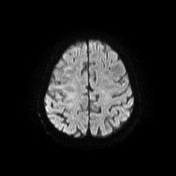
[im 104/104]
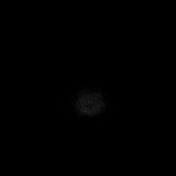

[Series 6: DWI · axial · 3.0mm · 1.36mm/px · z∈[-44,+108]mm · 2 of 52 slices shown (2 of 2)]
[im 1/52]
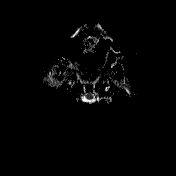
[im 52/52]
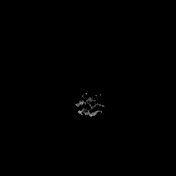

[Series 7: T1 · sagittal · 5.0mm · 0.75mm/px · 2 of 26 slices shown (1 of 2)]
[im 1/26]
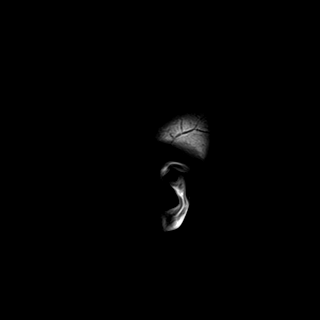
[im 26/26]
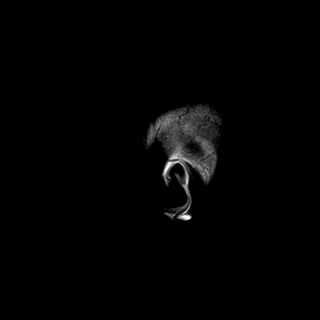

[Series 8: T2 · axial · 5.0mm · 0.62mm/px · z∈[-48,+108]mm · 2 of 25 slices shown]
[im 1/25]
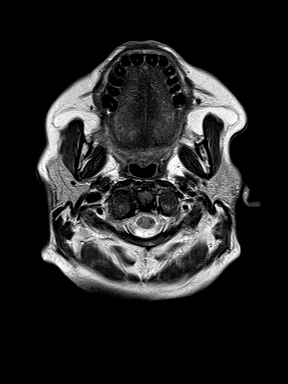
[im 25/25]
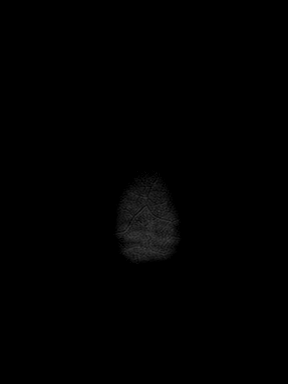

[Series 9: swi_images · axial · 3.0mm · 0.75mm/px · z∈[-46,+106]mm · 3 of 52 slices shown]
[im 1/52]
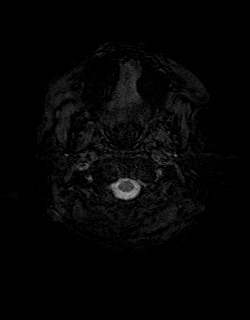
[im 26/52]
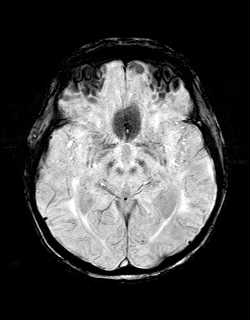
[im 52/52]
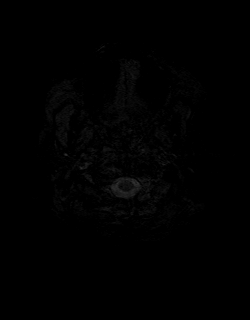

[Series 11: FLAIR · axial · 3.0mm · 0.75mm/px · z∈[-46,+106]mm · 3 of 52 slices shown]
[im 1/52]
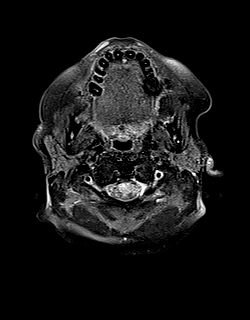
[im 26/52]
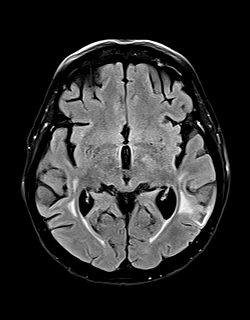
[im 52/52]
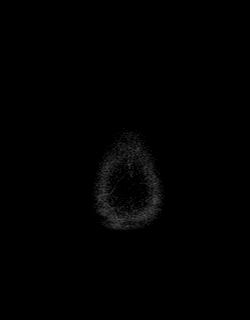

[Series 12: T1 · axial · 1.0mm · 0.94mm/px · z∈[-45,+114]mm · 10 of 160 slices shown (2 of 2)]
[im 1/160]
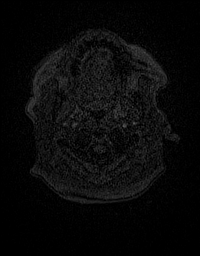
[im 18/160]
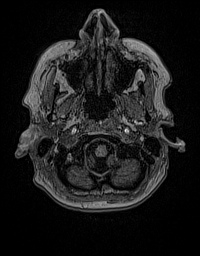
[im 36/160]
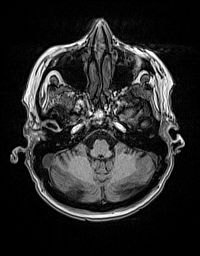
[im 54/160]
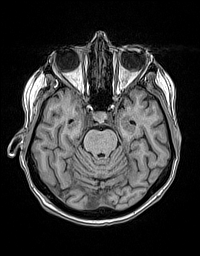
[im 71/160]
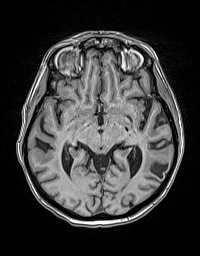
[im 89/160]
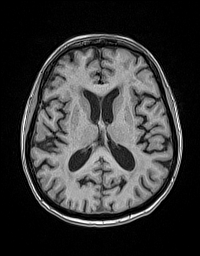
[im 107/160]
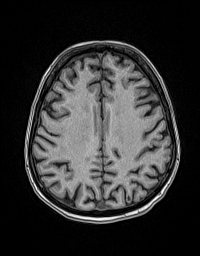
[im 124/160]
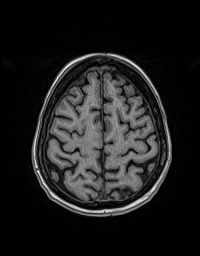
[im 142/160]
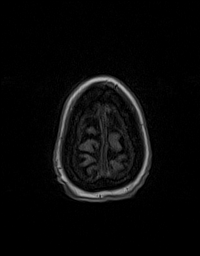
[im 160/160]
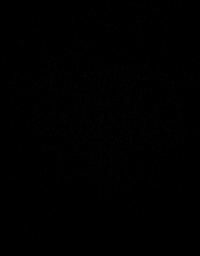

[Series 13: cor dwi_tracew · coronal · 5.0mm · 1.53mm/px · 3 of 56 slices shown]
[im 1/56]
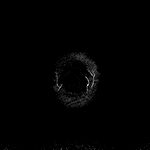
[im 28/56]
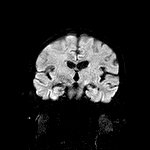
[im 56/56]
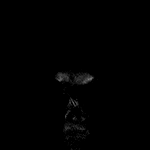

[Series 14: cor dwi_adc · coronal · 5.0mm · 1.53mm/px · 2 of 27 slices shown]
[im 1/27]
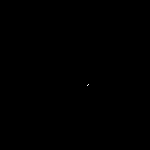
[im 27/27]
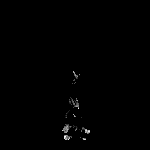

[Series 15: T2 post-contrast · coronal · 5.0mm · 0.57mm/px · 2 of 29 slices shown]
[im 1/29]
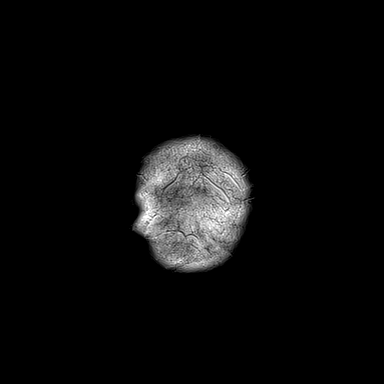
[im 29/29]
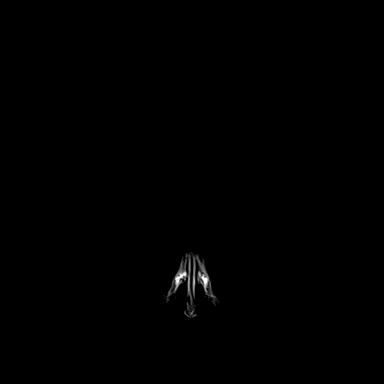

[Series 16: T1 post-contrast · axial · 1.0mm · 0.94mm/px · z∈[-45,+114]mm · 10 of 160 slices shown (1 of 3)]
[im 1/160]
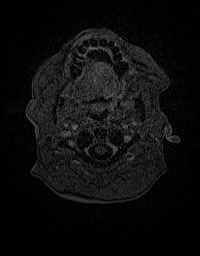
[im 18/160]
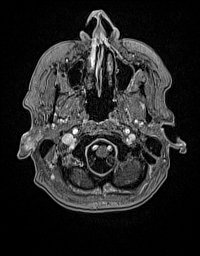
[im 36/160]
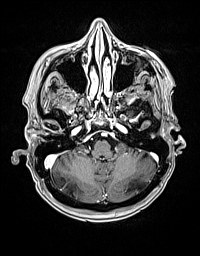
[im 54/160]
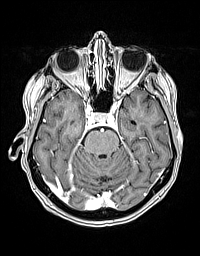
[im 71/160]
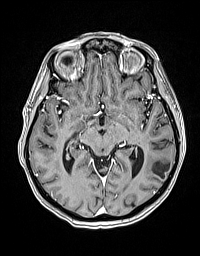
[im 89/160]
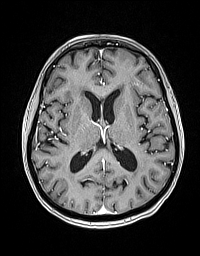
[im 107/160]
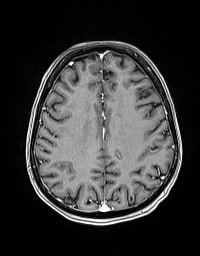
[im 124/160]
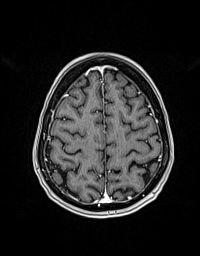
[im 142/160]
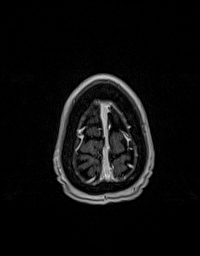
[im 160/160]
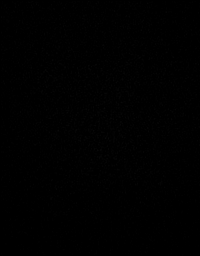

[Series 17: T1 post-contrast · coronal · 5.0mm · 0.43mm/px · 2 of 29 slices shown (2 of 3)]
[im 1/29]
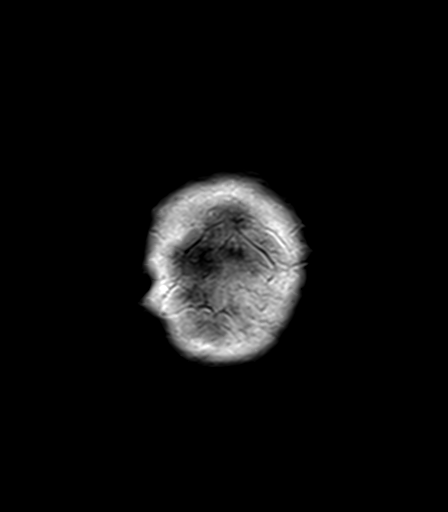
[im 29/29]
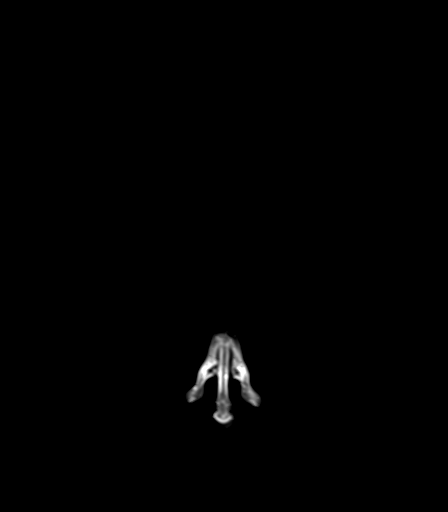

[Series 18: T1 post-contrast · sagittal · 5.0mm · 0.75mm/px · 2 of 26 slices shown (3 of 3)]
[im 1/26]
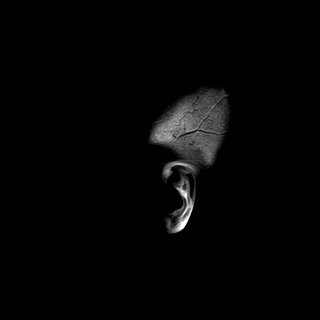
[im 26/26]
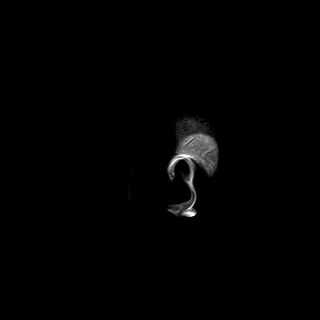

[48 of 48 positions shown; findings below may reference images not displayed]

FINDINGS: Brain: No new abnormality affects the brain. The brainstem and
cerebellum remain normal. Both cerebral hemispheres show a pattern
of patchy T2 and FLAIR signal within the deep and subcortical white
matter which is unchanged. Previously seen enhancing lesion of the
left posterior temporal lobe shows continued reduction in the amount
of adjacent edema and shows only minimal residual enhancement along
the walls and at the floor. Previously seen enhancing lesion at the
left posterior parietal cortex does not show any edema or
enhancement. No hydrocephalus. No extra-axial collection.

Vascular: Major vessels at the base of the brain show flow.

Skull and upper cervical spine: Negative appearance of the skull.
Chronic marrow abnormality of the cervical spine.

Sinuses/Orbits: Clear/normal

Other: None
IMPRESSION: Continued favorable response to treatment. Continued reduction in
the amount of vasogenic edema in the left posterior temporal lobe.
Previously seen enhancing lesion shows slight contraction, with only
minimal wall and floor enhancement. No worsening or progressive
finding. Previously seen enhancing left posterior parietal cortical
lesion shows resolution of edema without any enhancement presently.

## 2021-02-07 MED ORDER — POTASSIUM CHLORIDE 10 MEQ/50ML IV SOLN
10.0000 meq | INTRAVENOUS | Status: AC
  Administered 2021-02-07 (×6): 10 meq via INTRAVENOUS
  Filled 2021-02-07 (×6): qty 50

## 2021-02-07 MED ORDER — DOCUSATE SODIUM 100 MG PO CAPS
100.0000 mg | ORAL_CAPSULE | Freq: Once | ORAL | Status: AC
  Administered 2021-02-07: 100 mg via ORAL
  Filled 2021-02-07: qty 1

## 2021-02-07 MED ORDER — SODIUM CHLORIDE 0.9 % IV SOLN: INTRAVENOUS | Status: DC

## 2021-02-07 MED ORDER — GADOBUTROL 1 MMOL/ML IV SOLN
6.0000 mL | Freq: Once | INTRAVENOUS | Status: AC | PRN
  Administered 2021-02-07: 6 mL via INTRAVENOUS

## 2021-02-07 NOTE — Telephone Encounter (Signed)
Pt stated she has been hospitalized for the last 3 weeks and will be needing skilled care when she leaves. Also she stated her cancer dr faxed over forms to be filled out.

## 2021-02-07 NOTE — Telephone Encounter (Signed)
Patient called while in the hospital to let Dr. Carles Collet know she had a TIA in August and was diagnosed with cancer since then.   She is doing well with the treatment and just wants Dr. Carles Collet to be aware of her progress.  She is not needing a call back she said and she is not currently having any neurological symptoms to speak of.

## 2021-02-07 NOTE — Progress Notes (Signed)
So far, she is doing pretty well.  There is still some episodes of confusion.  She does have a little bit of rapid speech.  I do think it be worthwhile seeing about an MRI of the brain to see how she has responded to the high-dose methotrexate.  Her renal function continues to improve.  Her BUN is 29 creatinine 1.44.  Her potassium is still low at 3.1.  I will give her some extra potassium.  Think we can stop the sodium bicarb IV fluid.  Her white cell count is 5.8.  Hemoglobin 10.5.  Her LDH is 241.  Her vital signs show temperature of 98.3.  Pulse 69.  Blood pressure 148/75.  Oral exam shows clearing of the mucositis.  Her lungs are clear.  Cardiac exam regular rate and rhythm.  Abdomen is soft.  Bowel sounds are present.  There is no guarding.  Extremity shows no clubbing, cyanosis or edema.  Neurological exam shows no focal neurological deficits.  I am happy that Ms. Taunton has gone through the second cycle of treatment.  I have to believe that it is working.  The MRI will confirm this.  She is going to the bathroom.  She is not having diarrhea.  We still have to try to find a place for her.  We are working on this right now.  We will see about getting paperwork submitted.  I appreciate their outstanding care that she is getting from the staff up on 6 [address].  [name], MD Darlyn Chamber 29:11

## 2021-02-07 NOTE — TOC Progression Note (Signed)
Transition of Care [facility]) - Progression Note    Patient Details  Name: Ariel [name] MRN: [id number] Date of Birth: [birth year]  Transition [facility]) CM/SW Contact  Joaquin Courts, RN Phone Number: 02/07/2021, 3:54 PM  Clinical Narrative:    CM has outreached to [facility] to initiate a hospital requested review.  Case ID # [id number] . [facility] to send a formal records request as part of review process.  Documents will be provided to [facility] once formal request received.   Expected Discharge Plan:  (to be determined) Barriers to Discharge: Continued Medical Work up  Expected Discharge Plan and Services Expected Discharge Plan:  (to be determined)   Discharge Planning Services: CM Consult Post Acute Care Choice: [place] arrangements for the past 2 months: Single Family Home                                       Social Determinants of Health (SDOH) Interventions    Readmission Risk Interventions No flowsheet data found.

## 2021-02-07 NOTE — Telephone Encounter (Signed)
FL2 received, put on provider's folder

## 2021-02-07 NOTE — Plan of Care (Signed)

## 2021-02-07 NOTE — Progress Notes (Signed)
Mobility Specialist - Progress Note    02/07/21 1442  Mobility  Activity Ambulated in hall  Level of [place] Other (Comment) (IV Pole)  Distance Ambulated (ft) 450 ft  Mobility Ambulated independently in hallway  Mobility Response Tolerated well  Mobility performed by Mobility specialist  $Mobility charge 1 Mobility   Upon entry pt was agreeable to ambulate and pushed IV pole while walking 450 ft in hallway. No complaints made during session. Pt stated feeling "so much better" today. After session, pt returned to recliner and was left sitting up with call bell at side.   [place] Specialist Acute Rehabilitation Services Phone: [phone] 02/07/21, 2:44 PM

## 2021-02-07 NOTE — Progress Notes (Addendum)
HINN 10 letter read to daughter Magda Paganini over the phone by myself and witnessed by [facility] lead Nancy Marus. Copy of letter was left in pt room. [facility] will be notified that the hospital has issued HINN 10 and they will be asked to start investigation into pt case.

## 2021-02-08 ENCOUNTER — Inpatient Hospital Stay (HOSPITAL_COMMUNITY): Payer: Medicare Other

## 2021-02-08 ENCOUNTER — Encounter: Payer: Self-pay | Admitting: *Deleted

## 2021-02-08 DIAGNOSIS — R609 Edema, unspecified: Secondary | ICD-10-CM

## 2021-02-08 LAB — COMPREHENSIVE METABOLIC PANEL
ALT: 40 U/L (ref 0–44)
AST: 24 U/L (ref 15–41)
Albumin: 3.3 g/dL — ABNORMAL LOW (ref 3.5–5.0)
Alkaline Phosphatase: 89 U/L (ref 38–126)
Anion gap: 8 (ref 5–15)
BUN: 28 mg/dL — ABNORMAL HIGH (ref 8–23)
CO2: 29 mmol/L (ref 22–32)
Calcium: 9.1 mg/dL (ref 8.9–10.3)
Chloride: 100 mmol/L (ref 98–111)
Creatinine, Ser: 1.21 mg/dL — ABNORMAL HIGH (ref 0.44–1.00)
GFR, Estimated: 49 mL/min — ABNORMAL LOW (ref >60)
Glucose, Bld: 132 mg/dL — ABNORMAL HIGH (ref 70–99)
Potassium: 3.8 mmol/L (ref 3.5–5.1)
Sodium: 137 mmol/L (ref 135–145)
Total Bilirubin: 0.2 mg/dL — ABNORMAL LOW (ref 0.3–1.2)
Total Protein: 6.3 g/dL — ABNORMAL LOW (ref 6.5–8.1)

## 2021-02-08 LAB — CBC WITH DIFFERENTIAL/PLATELET
Abs Immature Granulocytes: 0.04 10*3/uL (ref 0.00–0.07)
Basophils Absolute: 0 10*3/uL (ref 0.0–0.1)
Basophils Relative: 0 %
Eosinophils Absolute: 0.2 10*3/uL (ref 0.0–0.5)
Eosinophils Relative: 3 %
HCT: 32.4 % — ABNORMAL LOW (ref 36.0–46.0)
Hemoglobin: 10.3 g/dL — ABNORMAL LOW (ref 12.0–15.0)
Immature Granulocytes: 1 %
Lymphocytes Relative: 29 %
Lymphs Abs: 1.6 10*3/uL (ref 0.7–4.0)
MCH: 27.7 pg (ref 26.0–34.0)
MCHC: 31.8 g/dL (ref 30.0–36.0)
MCV: 87.1 fL (ref 80.0–100.0)
Monocytes Absolute: 0.7 10*3/uL (ref 0.1–1.0)
Monocytes Relative: 13 %
Neutro Abs: 2.9 10*3/uL (ref 1.7–7.7)
Neutrophils Relative %: 54 %
Platelets: 233 10*3/uL (ref 150–400)
RBC: 3.72 MIL/uL — ABNORMAL LOW (ref 3.87–5.11)
RDW: 21.8 % — ABNORMAL HIGH (ref 11.5–15.5)
WBC: 5.4 10*3/uL (ref 4.0–10.5)
nRBC: 0 % (ref 0.0–0.2)

## 2021-02-08 LAB — LACTATE DEHYDROGENASE: LDH: 252 U/L — ABNORMAL HIGH (ref 98–192)

## 2021-02-08 IMAGING — CT CT CHEST-ABD-PELV W/ CM
2 of 5 series · 11 of 36 positions shown, 16 images · IV contrast (OMNIPAQUE)
Comparison: [DATE] chest abdomen and pelvic CTs.

CLINICAL DATA: Status post 2 cycles of chemotherapy. Evaluate
treatment response of lymphoma. Asymptomatic. Prior melanoma.

EXAM:
CT CHEST, ABDOMEN, AND PELVIS WITH CONTRAST
TECHNIQUE: Multidetector CT imaging of the chest, abdomen and pelvis was
performed following the standard protocol during bolus
administration of intravenous contrast.
CONTRAST:  60mL OMNIPAQUE IOHEXOL 350 MG/ML SOLN

[Series 2: cap with · axial · 0.68mm/px · z∈[-352,+168]mm · 8 of 134 slices shown, 13 images]
[im 15/134  mediastinal]
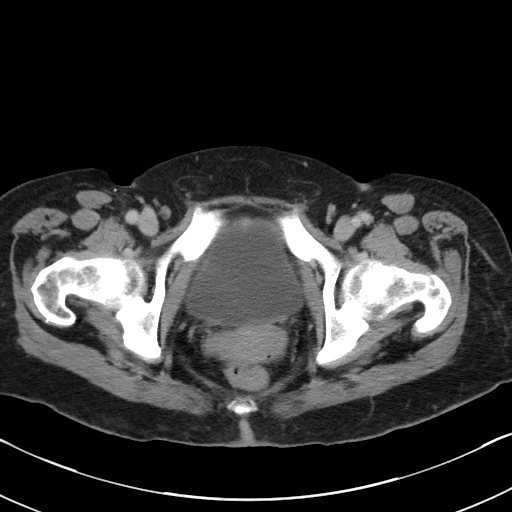
[im 15/134  bone]
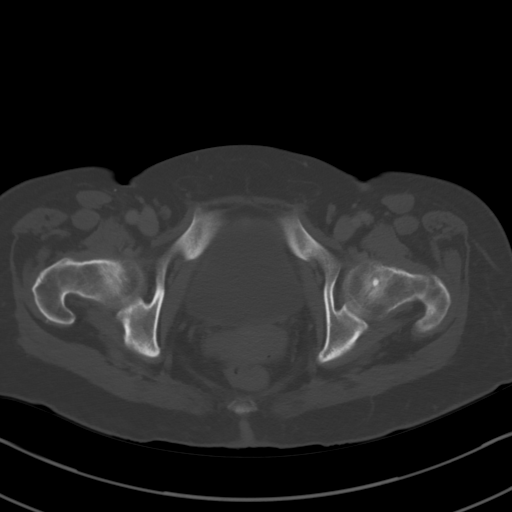
[im 30/134  mediastinal]
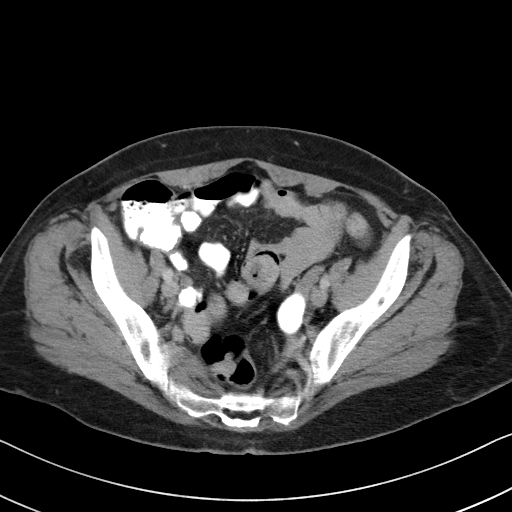
[im 45/134  mediastinal]
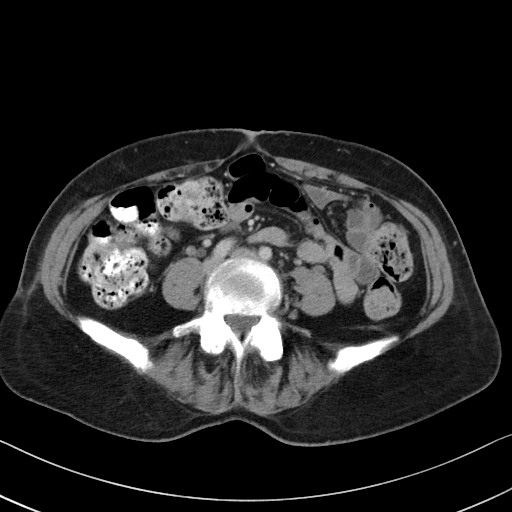
[im 60/134  mediastinal]
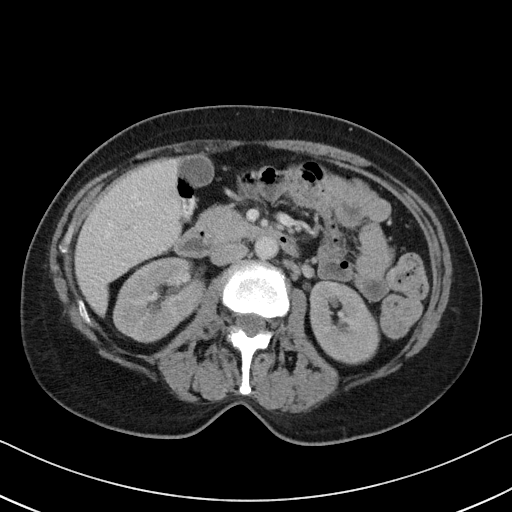
[im 74/134  mediastinal]
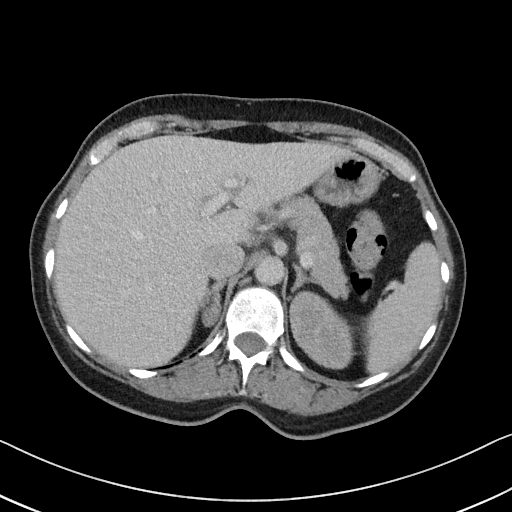
[im 74/134  lung]
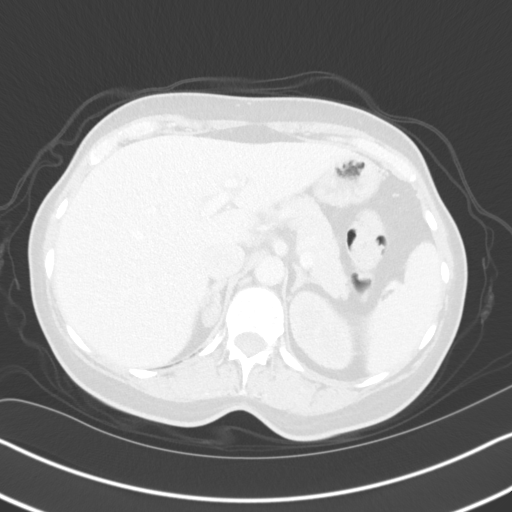
[im 89/134  mediastinal]
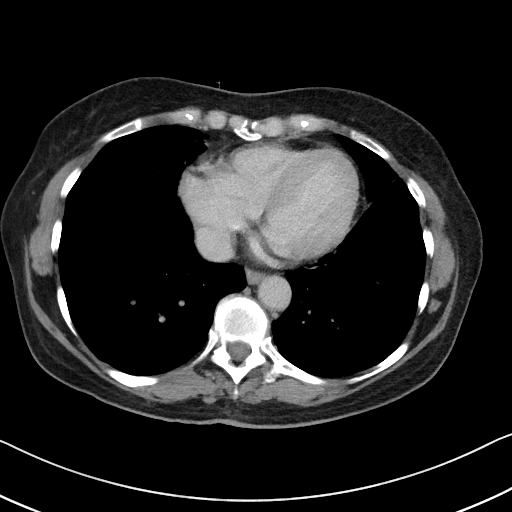
[im 89/134  lung]
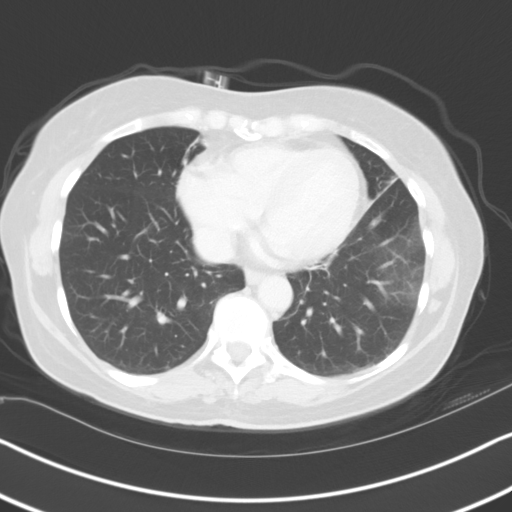
[im 104/134  mediastinal]
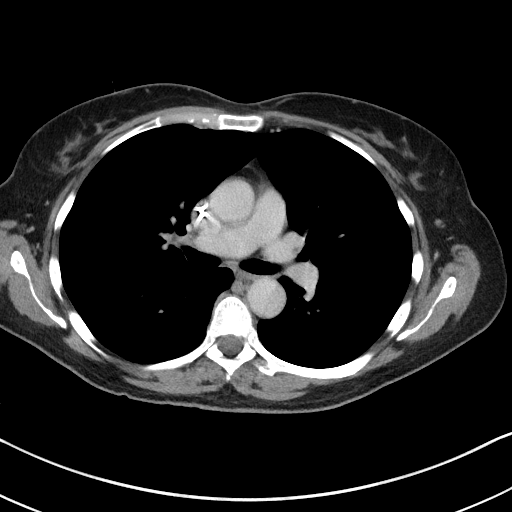
[im 104/134  lung]
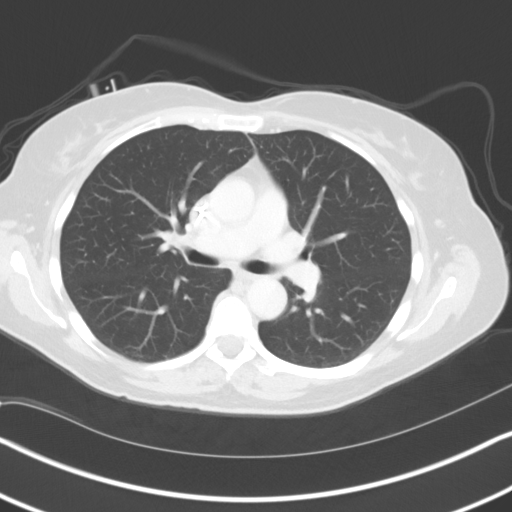
[im 119/134  mediastinal]
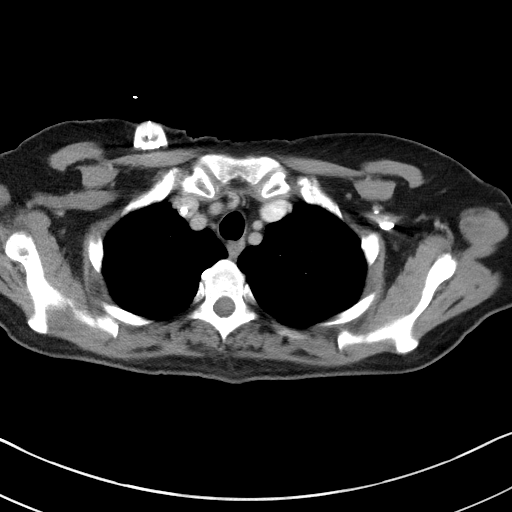
[im 119/134  lung]
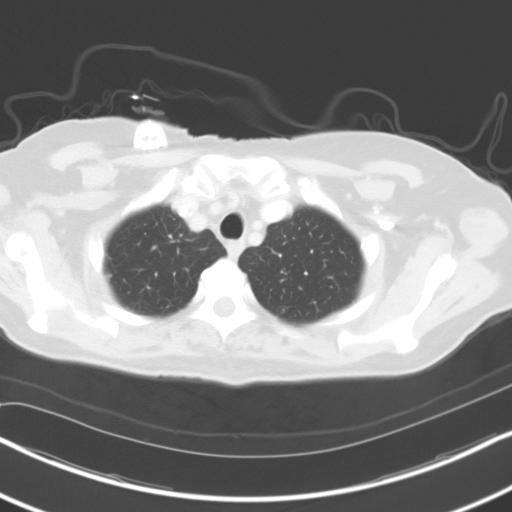

[Series 5: coronals · coronal · 0.75mm/px · 3 of 136 slices shown]
[im 28/136  mediastinal]
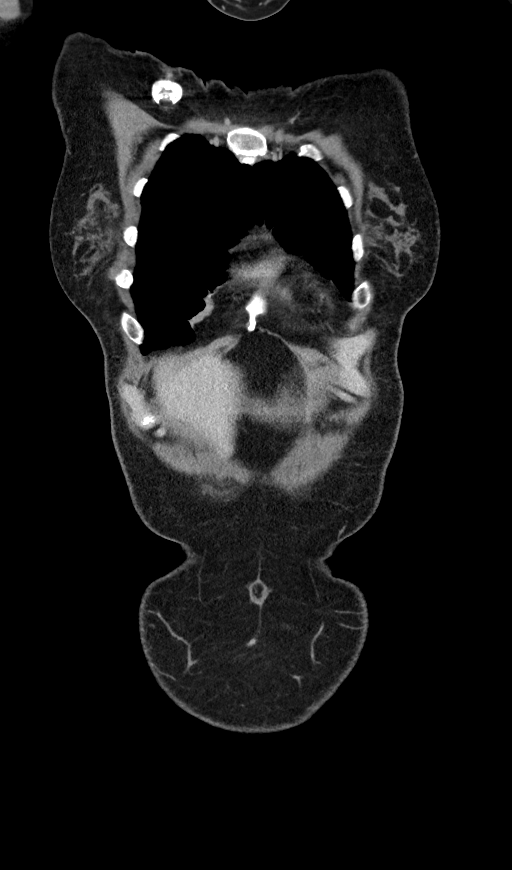
[im 55/136  mediastinal]
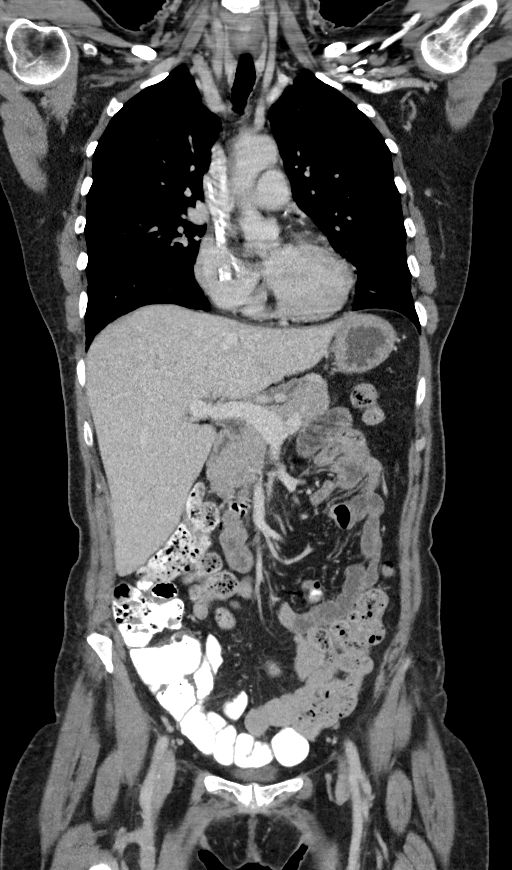
[im 82/136  mediastinal]
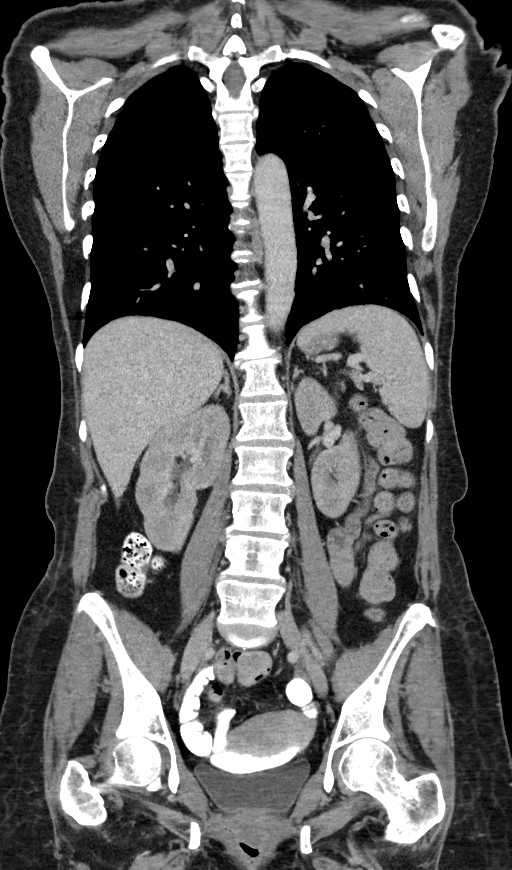

[11 of 36 positions shown; findings below may reference images not displayed]

FINDINGS: CT CHEST FINDINGS

Cardiovascular: Right Port-A-Cath tip mid to low right atrium.
Aortic atherosclerosis. Normal heart size, without pericardial
effusion. No central pulmonary embolism, on this non-dedicated
study.

Mediastinum/Nodes: No axillary adenopathy. No mediastinal or hilar
adenopathy.

Lungs/Pleura: No pleural fluid.  Clear lungs.

Musculoskeletal: No acute osseous abnormality.

CT ABDOMEN PELVIS FINDINGS

Hepatobiliary: Hepatomegaly at 20 cm craniocaudal. Normal
gallbladder, without biliary ductal dilatation.

Pancreas: Normal, without mass or ductal dilatation.

Spleen: Normal in size, without focal abnormality.

Adrenals/Urinary Tract: Normal left adrenal gland. 1.3 cm right
adrenal nodule is similar to on the prior exam (when remeasured).
This was consistent with an adenoma on prior PET.

Decreased corticomedullary differentiation within both kidneys,
greater right than left. No hydronephrosis. Normal urinary bladder.

Stomach/Bowel: Normal stomach, without wall thickening. Colonic
stool burden suggests constipation. Normal terminal ileum. Normal
small bowel.

Vascular/Lymphatic: Aortic atherosclerosis. Index abdominal
retroperitoneal node within the left periaortic space measures 7 mm
on 72/2 versus 13 mm on the prior CT.

Gastrohepatic ligament node measures 1.0 cm on 63/2 versus 1.6 cm on
the prior exam.

Portacaval node measures 8 mm on 66/2 versus 12 mm on the prior
exam.

Right inguinal surgical clips.  No pelvic sidewall adenopathy.

Reproductive: Normal uterus and adnexa.

Other: No significant free fluid. Mild pelvic floor laxity. No free
intraperitoneal air.

Musculoskeletal: No acute osseous abnormality. Mild convex left
thoracolumbar spine curvature.
IMPRESSION: 1. Response to therapy of abdominal adenopathy since [DATE] CT.
No new or progressive disease.
2. The splenic lesions on the prior CT have resolved.
3. Similar right adrenal adenoma.
4. New decreased corticomedullary differentiation within both
kidneys, worse on the right. Correlate with renal insufficiency.
Differential considerations include pyelonephritis. Renal lymphoma
could have this appearance, but is felt less likely given response
to therapy elsewhere.
5.  Aortic Atherosclerosis ([53]-[53]).
6. Hepatomegaly.

## 2021-02-08 MED ORDER — IOHEXOL 9 MG/ML PO SOLN
ORAL | Status: AC
  Administered 2021-02-08: 500 mL
  Filled 2021-02-08: qty 1000

## 2021-02-08 MED ORDER — IOHEXOL 350 MG/ML SOLN
60.0000 mL | Freq: Once | INTRAVENOUS | Status: AC | PRN
  Administered 2021-02-08: 60 mL via INTRAVENOUS

## 2021-02-08 MED ORDER — SODIUM CHLORIDE (PF) 0.9 % IJ SOLN
INTRAMUSCULAR | Status: AC
  Filled 2021-02-08: qty 50

## 2021-02-08 MED ORDER — IOHEXOL 12 MG/ML PO SOLN
500.0000 mL | ORAL | Status: AC
  Administered 2021-02-08 (×2): 500 mL via ORAL

## 2021-02-08 NOTE — Progress Notes (Signed)
So far, I think that things are going quite nicely for Ariel [name].  The MRI of the brain showed essentially resolution of the CNS lymphoma that she had.  Now that her kidney function is better, we will do a CT of the body to see how the systemic disease is doing.  Ariel [name] would like to go home.  I think we can get her home tomorrow.  I spoke to her daughter yesterday.  I explained to the daughter that we just are not able to keep her here.  She does not qualify for skilled nursing.  I am not sure how long it is going to take to get her into assisted living.  I think Ariel [name] has the physical and cognitive ability to be home.  She feels this.  I just feel that Ariel [name] would be okay at home.  We just have to give her family 1 day to make sure the house is all ready.  Her lab work shows that she is not neutropenic.  I doubt she will become neutropenic.  This was a concern of ours with her going home.  She will not need Neupogen.  Her hemoglobin is 10.3 which is stable.  Platelet count is 233,000.  Her potassium is 3.8.  Her BUN is 28 creatinine 1.21.  Her albumin is 3.3.  Her vital signs show temperature of 97.4.  Pulse is 70.  Blood pressure 150/84.  Her head neck exam shows resolution of the mucositis.  She has no adenopathy in the neck.  Her lungs are clear.  Cardiac exam regular rate and rhythm.  Abdomen is soft.  Bowel sounds are present.  Extremity shows no clubbing, cyanosis or edema.  Neurological exam shows no focal deficits.  She is alert and oriented.  I realize this is been incredibly long and complicated hospital stay.  Again I think a lot was triggered by steroids and also by swelling that was in the brain from her CNS disease.  All this is now resolved.  We will plan for discharge tomorrow.  I just want to make sure that her family has a house ready for her.  I know all the staff up on 6 E. and also on 5 E. have really done wonderfully.  They really have shown a lot of  compassion for Ariel [name] and her family.  There has been a big part of her improving so much.  I would probably plan on getting her back into the hospital right after [facility] and doing the third cycle of chemotherapy.  I probably will have her come into the office after Christmas so that we can see how everything is going.  [name], MD  Darlyn Chamber 29:11

## 2021-02-08 NOTE — Telephone Encounter (Signed)
Pt's daughter called back and would like a call when paperowrk is done. [phone]

## 2021-02-08 NOTE — Telephone Encounter (Signed)
Pt. Daughter called regarding paperwork. She just wanted to inform that the paperwork is what will put mother in a facility to help her. Just an FYI.

## 2021-02-08 NOTE — Progress Notes (Signed)
Plan for patient to be discharged tomorrow. Spoke with Dr Marin Olp and he would like the patient to follow up the week after Christmas.   Appointment scheduled. Called and spoke to patient's son, [place]. He is aware of appointment date, time, and location.   Oncology Nurse Navigator Documentation  Oncology Nurse Navigator Flowsheets 02/08/2021  Abnormal Finding Date -  Confirmed Diagnosis Date -  Phase of Treatment -  Chemotherapy Actual Start Date: -  Navigator Follow Up Date: 02/22/2021  Navigator Follow Up Reason: Follow-up Appointment  Navigator Location [facility]  Navigator Encounter Type Telephone;Appt/Treatment Plan Review  Telephone Appt Confirmation/Clarification;Outgoing Call  Treatment Initiated Date -  Patient Visit Type MedOnc  Treatment Phase Active Tx  Barriers/Navigation Needs Coordination of Care;Education  Education Other  Interventions Coordination of Care;Education  Acuity Level 2-Minimal Needs (1-2 Barriers Identified)  Referrals -  Coordination of Care Appts  Education Method Verbal  Support Groups/Services Friends and Family  Time Spent with Patient 23

## 2021-02-08 NOTE — Progress Notes (Signed)
°   02/08/21 1200  Mobility  Activity Ambulated in hall  Level of Assistance Independent  Assistive Device None  Distance Ambulated (ft) 500 ft  Mobility Ambulated with assistance in hallway  Mobility Response Tolerated well  Mobility performed by Mobility specialist  $Mobility charge 1 Mobility   Pt agreeable to mobilize this morning. Ambulated about 510ft in hall, tolerated well. Pt left in chair with call bell at side. RN notified of session.  [place] Specialist Acute Rehab Services Office: [phone]

## 2021-02-08 NOTE — Progress Notes (Signed)
BLE venous duplex has been completed.  Results can be found under chart review under CV PROC. 02/08/2021 12:10 PM Julizza Sassone RVT, RDMS

## 2021-02-09 ENCOUNTER — Ambulatory Visit: Payer: Medicare Other

## 2021-02-09 LAB — COMPREHENSIVE METABOLIC PANEL
ALT: 39 U/L (ref 0–44)
AST: 24 U/L (ref 15–41)
Albumin: 3.4 g/dL — ABNORMAL LOW (ref 3.5–5.0)
Alkaline Phosphatase: 93 U/L (ref 38–126)
Anion gap: 8 (ref 5–15)
BUN: 34 mg/dL — ABNORMAL HIGH (ref 8–23)
CO2: 29 mmol/L (ref 22–32)
Calcium: 9.7 mg/dL (ref 8.9–10.3)
Chloride: 100 mmol/L (ref 98–111)
Creatinine, Ser: 1.34 mg/dL — ABNORMAL HIGH (ref 0.44–1.00)
GFR, Estimated: 44 mL/min — ABNORMAL LOW (ref >60)
Glucose, Bld: 102 mg/dL — ABNORMAL HIGH (ref 70–99)
Potassium: 4 mmol/L (ref 3.5–5.1)
Sodium: 137 mmol/L (ref 135–145)
Total Bilirubin: 0.2 mg/dL — ABNORMAL LOW (ref 0.3–1.2)
Total Protein: 6.4 g/dL — ABNORMAL LOW (ref 6.5–8.1)

## 2021-02-09 LAB — CBC WITH DIFFERENTIAL/PLATELET
Abs Immature Granulocytes: 0.04 10*3/uL (ref 0.00–0.07)
Basophils Absolute: 0 10*3/uL (ref 0.0–0.1)
Basophils Relative: 1 %
Eosinophils Absolute: 0.2 10*3/uL (ref 0.0–0.5)
Eosinophils Relative: 3 %
HCT: 33.8 % — ABNORMAL LOW (ref 36.0–46.0)
Hemoglobin: 10.7 g/dL — ABNORMAL LOW (ref 12.0–15.0)
Immature Granulocytes: 1 %
Lymphocytes Relative: 31 %
Lymphs Abs: 1.6 10*3/uL (ref 0.7–4.0)
MCH: 27.6 pg (ref 26.0–34.0)
MCHC: 31.7 g/dL (ref 30.0–36.0)
MCV: 87.1 fL (ref 80.0–100.0)
Monocytes Absolute: 0.7 10*3/uL (ref 0.1–1.0)
Monocytes Relative: 14 %
Neutro Abs: 2.7 10*3/uL (ref 1.7–7.7)
Neutrophils Relative %: 50 %
Platelets: 276 10*3/uL (ref 150–400)
RBC: 3.88 MIL/uL (ref 3.87–5.11)
RDW: 22 % — ABNORMAL HIGH (ref 11.5–15.5)
WBC: 5.3 10*3/uL (ref 4.0–10.5)
nRBC: 0 % (ref 0.0–0.2)

## 2021-02-09 LAB — LACTATE DEHYDROGENASE: LDH: 243 U/L — ABNORMAL HIGH (ref 98–192)

## 2021-02-09 MED ORDER — HEPARIN SOD (PORK) LOCK FLUSH 100 UNIT/ML IV SOLN
500.0000 [IU] | INTRAVENOUS | Status: AC | PRN
  Administered 2021-02-09: 11:00:00 500 [IU]

## 2021-02-09 MED ORDER — VUITY 1.25 % OP SOLN
1.0000 [drp] | Freq: Every day | OPHTHALMIC | 4 refills | Status: DC
Start: 2021-02-09 — End: 2021-03-07

## 2021-02-09 MED ORDER — ACYCLOVIR 200 MG PO CAPS: 200.0000 mg | ORAL_CAPSULE | Freq: Two times a day (BID) | ORAL | 3 refills | Status: DC

## 2021-02-09 MED ORDER — OLANZAPINE 5 MG PO TBDP: 5.0000 mg | ORAL_TABLET | Freq: Every day | ORAL | 3 refills | Status: DC

## 2021-02-09 NOTE — Discharge Summary (Signed)
Physician Discharge Summary  Patient ID: Ariel [name] @ATTENDINGNPI @ MRN: [id number] DOB/AGE: Jun 09, 1955 65 y.o.  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 Status post cycle #2 of chemotherapy with R-MTV Hypokalemia Transient anemia secondary to chemotherapy and iron deficiency  Discharge Diagnoses:  Principal Problem:   AMS (altered mental status) Active Problems:   Hyperlipidemia   Iron deficiency anemia   Hyponatremia   Brain metastasis (HCC)   Acute metabolic encephalopathy   High grade B-cell lymphoma (HCC)   Neutropenia (HCC)   Discharged Condition: good  Discharge Labs:   Significant Diagnostic Studies: MRI and CT scans  Consults: None  Disposition: Discharge disposition: 01-Home or Self Care       Treatments: Chemotherapy with Rituxan-high-dose methotrexate/vincristine/thiotepa given on 01/25/2021    Follow-up Information     Volanda Napoleon, MD Follow up in 2 week(s).   Specialty: Oncology Why: We will call you for the appointment time. Contact information: [place] [address] [phone]                 Hospital Course: Ariel [name] had a very complicated hospital course.  She was admitted on 01/14/2021.  She had quite a bit of confusion.  She had a first cycle of chemotherapy in the office.  She received Rituxan-ICE.  I think that she had some confusion because of the steroid that she was receiving.  She also had a lot of back pain which I think was from the Neulasta injection.  When she came in, she was admitted to 5 E.  Her labs did not look all that bad.  We did a MRI of the brain.  This showed that she had partial response to the CNS lymphoma lesions.  She continued to have episodes of confusion.  Again, I think this may have been from steroids.  She will steroids to help with  any swelling in the brain.  We were able to taper her steroids down.  She improved mentally.  She was eating okay.  She is having no problems with nausea or vomiting.  She was having no diarrhea.  There was no fever.  She had no cough.  Says she improved with her status, I thought that we could try a second cycle of treatment in the hospital.  She was then moved up to 6 E.  I felt that we would try a regimen that would be more geared toward the CNS disease.  As such, I tried her on chemotherapy with R-MTV.  This was high-dose methotrexate based.  She started this on 29 November.  Again, she had episodes of confusion which I think were from the steroids that she received as premedication.  We stopped the steroids.  Her confusion gradually improved and then she improved back to her baseline.  She tolerated treatment well.  We did do leucovorin rescue.  This was done every 6 hours.  Pharmacy was incredibly helpful in monitoring the methotrexate levels.  We obtained daily methotrexate levels.  We are finally able to get her level down to less than 0.1.  At that time, we are able to stop the leucovorin.  She tolerated treatment well.  She really did not become neutropenic or thrombocytopenic.  She did have marked hypokalemia.  We did have to give her runs of potassium several days in a row.  She had great urine output.  We had her urine pH of 8-9.  This certainly helped with protecting her kidneys from the methotrexate.  We did do another MRI of the brain.  This was done on 02/07/2021.  This showed basically resolution of the CNS lesions.  There may been a little bit of a abnormality but again nothing that looked to be active.  On 02/08/2021, we did a CT scan of her body.  This showed resolution of the lymphoma at least on CT scan.  I also did Dopplers of her legs.  She did not have any blood clots in her legs.  She was eating well.  She was ambulating well.  She was having no problems with bowels or  bladder.  She may been a little bit constipated.  Her family was trying to find a assisted living for her.  Unfortunately, we have not been able to obtain a admission for assisted living as of yet.  We felt that we needed to get her home until a facility could be found for her.  On the day of discharge, her vital signs show temperature 98.1.  Pulse 75.  Blood pressure 131/68.  Oxygen saturation was 99%.  Her head neck exam shows no ocular or oral lesions.  She had no mucositis in the oral cavity.  There is no adenopathy in the neck.  Lungs were clear bilaterally.  Cardiac exam regular rate and rhythm.  Abdomen is soft.  Bowel sounds are active.  There is no guarding or rebound tenderness.  There is no palpable liver or spleen tip.  Extremity shows no clubbing, cyanosis or edema.  She has good range of motion of all of her joints.  She has good strength in upper and lower extremities.  Neurological exam shows no focal neurological deficits.  Her labs on discharge show sodium 137.  Potassium 4.0.  BUN 34 creatinine 1.34.  Calcium 9.7.  Albumin 3.4.  Her white cell count is 5.3.  Hemoglobin 10.7.  And platelet count is 276,000.  She really has done nicely.  She has responded as I thought she would.  We will plan to get her back to the office after Christmas.  I want to see how she is doing I want to check her lab work.  Our plan is to try to admit her the first week in January for her third cycle of treatment.  I told her that she is in remission.  However, this does not mean cure.  She understands this.  I recommended that I did give her a transfusion a week ago.  Her hemoglobin was 8.  This may have been from some renal insufficiency from the methotrexate.  She also has had IV iron.  She has responded well.  Upon discharge, she will have his regular diet at home.  Discharge Exam: Blood pressure 131/68, pulse 75, temperature 98.1 F (36.7 C), temperature source Oral, resp. rate 17, height 5\' 6"   (1.676 m), weight 138 lb 14.2 oz (63 kg), SpO2 99 %. See above    Signed: Volanda Napoleon 02/09/2021, 7:09 AM

## 2021-02-09 NOTE — Discharge Instructions (Signed)
Please call if there are any problems at home with respect to bleeding, vomiting, fever, diarrhea, confusion.

## 2021-02-09 NOTE — Telephone Encounter (Signed)
Patient's daughter advised paperwork turn around is from 5 to 7 days and Lenna Sciara will be working on forms this afternoon. She can pick up later today or tomorrow.

## 2021-02-10 ENCOUNTER — Other Ambulatory Visit: Payer: Self-pay | Admitting: Hematology & Oncology

## 2021-02-10 DIAGNOSIS — C8510 Unspecified B-cell lymphoma, unspecified site: Secondary | ICD-10-CM

## 2021-02-22 ENCOUNTER — Encounter: Payer: Self-pay | Admitting: *Deleted

## 2021-02-22 ENCOUNTER — Inpatient Hospital Stay: Payer: Medicare Other

## 2021-02-22 ENCOUNTER — Other Ambulatory Visit: Payer: Self-pay

## 2021-02-22 ENCOUNTER — Inpatient Hospital Stay: Payer: Medicare Other | Attending: Internal Medicine

## 2021-02-22 ENCOUNTER — Inpatient Hospital Stay (HOSPITAL_BASED_OUTPATIENT_CLINIC_OR_DEPARTMENT_OTHER): Payer: Medicare Other | Admitting: Hematology & Oncology

## 2021-02-22 ENCOUNTER — Encounter: Payer: Self-pay | Admitting: Hematology & Oncology

## 2021-02-22 VITALS — BP 138/58 | HR 68 | Temp 98.1°F | Resp 18 | Wt 147.0 lb

## 2021-02-22 DIAGNOSIS — Z885 Allergy status to narcotic agent status: Secondary | ICD-10-CM | POA: Diagnosis not present

## 2021-02-22 DIAGNOSIS — D509 Iron deficiency anemia, unspecified: Secondary | ICD-10-CM | POA: Insufficient documentation

## 2021-02-22 DIAGNOSIS — C8510 Unspecified B-cell lymphoma, unspecified site: Secondary | ICD-10-CM

## 2021-02-22 DIAGNOSIS — Z5111 Encounter for antineoplastic chemotherapy: Secondary | ICD-10-CM | POA: Insufficient documentation

## 2021-02-22 DIAGNOSIS — Z9221 Personal history of antineoplastic chemotherapy: Secondary | ICD-10-CM | POA: Diagnosis not present

## 2021-02-22 DIAGNOSIS — C8339 Diffuse large B-cell lymphoma, extranodal and solid organ sites: Secondary | ICD-10-CM | POA: Diagnosis not present

## 2021-02-22 DIAGNOSIS — Z79899 Other long term (current) drug therapy: Secondary | ICD-10-CM | POA: Diagnosis not present

## 2021-02-22 DIAGNOSIS — Z95828 Presence of other vascular implants and grafts: Secondary | ICD-10-CM

## 2021-02-22 LAB — RETICULOCYTES
Immature Retic Fract: 9.7 % (ref 2.3–15.9)
RBC.: 3.8 MIL/uL — ABNORMAL LOW (ref 3.87–5.11)
Retic Count, Absolute: 111.7 10*3/uL (ref 19.0–186.0)
Retic Ct Pct: 2.9 % (ref 0.4–3.1)

## 2021-02-22 LAB — CBC WITH DIFFERENTIAL (CANCER CENTER ONLY)
Abs Immature Granulocytes: 0.07 10*3/uL (ref 0.00–0.07)
Basophils Absolute: 0 10*3/uL (ref 0.0–0.1)
Basophils Relative: 1 %
Eosinophils Absolute: 0.1 10*3/uL (ref 0.0–0.5)
Eosinophils Relative: 2 %
HCT: 33.5 % — ABNORMAL LOW (ref 36.0–46.0)
Hemoglobin: 10.9 g/dL — ABNORMAL LOW (ref 12.0–15.0)
Immature Granulocytes: 1 %
Lymphocytes Relative: 32 %
Lymphs Abs: 2 10*3/uL (ref 0.7–4.0)
MCH: 28.5 pg (ref 26.0–34.0)
MCHC: 32.5 g/dL (ref 30.0–36.0)
MCV: 87.5 fL (ref 80.0–100.0)
Monocytes Absolute: 0.7 10*3/uL (ref 0.1–1.0)
Monocytes Relative: 10 %
Neutro Abs: 3.4 10*3/uL (ref 1.7–7.7)
Neutrophils Relative %: 54 %
Platelet Count: 218 10*3/uL (ref 150–400)
RBC: 3.83 MIL/uL — ABNORMAL LOW (ref 3.87–5.11)
RDW: 20 % — ABNORMAL HIGH (ref 11.5–15.5)
WBC Count: 6.3 10*3/uL (ref 4.0–10.5)
nRBC: 0 % (ref 0.0–0.2)

## 2021-02-22 LAB — CMP (CANCER CENTER ONLY)
ALT: 23 U/L (ref 0–44)
AST: 22 U/L (ref 15–41)
Albumin: 4 g/dL (ref 3.5–5.0)
Alkaline Phosphatase: 97 U/L (ref 38–126)
Anion gap: 7 (ref 5–15)
BUN: 20 mg/dL (ref 8–23)
CO2: 30 mmol/L (ref 22–32)
Calcium: 9.7 mg/dL (ref 8.9–10.3)
Chloride: 102 mmol/L (ref 98–111)
Creatinine: 0.92 mg/dL (ref 0.44–1.00)
GFR, Estimated: >60 mL/min (ref >60)
Glucose, Bld: 93 mg/dL (ref 70–99)
Potassium: 3.7 mmol/L (ref 3.5–5.1)
Sodium: 139 mmol/L (ref 135–145)
Total Bilirubin: 0.4 mg/dL (ref 0.3–1.2)
Total Protein: 6.4 g/dL — ABNORMAL LOW (ref 6.5–8.1)

## 2021-02-22 LAB — SAMPLE TO BLOOD BANK

## 2021-02-22 LAB — URIC ACID: Uric Acid, Serum: 4.5 mg/dL (ref 2.5–7.1)

## 2021-02-22 LAB — LACTATE DEHYDROGENASE: LDH: 233 U/L — ABNORMAL HIGH (ref 98–192)

## 2021-02-22 LAB — IRON AND IRON BINDING CAPACITY (CC-WL,HP ONLY)
Iron: 121 ug/dL (ref 28–170)
Saturation Ratios: 46 % — ABNORMAL HIGH (ref 10.4–31.8)
TIBC: 265 ug/dL (ref 250–450)
UIBC: 144 ug/dL — ABNORMAL LOW (ref 148–442)

## 2021-02-22 MED ORDER — SODIUM CHLORIDE 0.9% FLUSH
10.0000 mL | Freq: Once | INTRAVENOUS | Status: AC
  Administered 2021-02-22: 14:00:00 10 mL via INTRAVENOUS

## 2021-02-22 MED ORDER — HEPARIN SOD (PORK) LOCK FLUSH 100 UNIT/ML IV SOLN
500.0000 [IU] | Freq: Once | INTRAVENOUS | Status: AC
  Administered 2021-02-22: 14:00:00 500 [IU] via INTRAVENOUS

## 2021-02-22 NOTE — Progress Notes (Signed)
DISCONTINUE OFF PATHWAY REGIMEN - Lymphoma and CLL   OFF10722:R-ICE (Ifosfamide D2,3,4) q21 Days (Outpatient):   A cycle is every 21 days:     Rituximab-xxxx      Ifosfamide      Mesna      Mesna      Carboplatin      Etoposide      Pegfilgrastim-xxxx   **Always confirm dose/schedule in your pharmacy ordering system**  REASON: Other Reason PRIOR TREATMENT: Off Pathway: R-ICE (Ifosfamide D2,3,4) q21 Days (Outpatient) TREATMENT RESPONSE: Unable to Evaluate  START OFF PATHWAY REGIMEN - Lymphoma and CLL   [id number]:R-ICE (CIV Ifosfamide) q21 Days (Inpatient):   A cycle is every 21 days:     Rituximab-xxxx      Etoposide      Carboplatin      Ifosfamide      Mesna      Pegfilgrastim-xxxx   **Always confirm dose/schedule in your pharmacy ordering system**  Patient Characteristics: Diffuse Large B-Cell Lymphoma or Follicular Lymphoma, Grade 3B, Relapsed / Refractory, All Stages,  Second Line, Relapse > 12 Months From Prior Chemoimmunotherapy, Not a Transplant Candidate, Not a Candidate for CAR T-Cell Therapy Disease Type: Not Applicable Disease Type: Diffuse Large B-Cell Lymphoma Disease Type: Not Applicable Line of therapy: Relapsed / Refractory - Second Line Time to Relapse: Relapse > 12 Months from Prior Chemoimmunotherapy Patient Characteristics: Not a Transplant Candidate Patient Characteristics: Not a Candidate for CAR T-Cell Therapy Intent of Therapy: Curative Intent, Discussed with Patient

## 2021-02-22 NOTE — Patient Instructions (Signed)

## 2021-02-22 NOTE — Addendum Note (Signed)
Addended by: Amelia Jo I on: 02/22/2021 02:04 PM   Modules accepted: Orders

## 2021-02-22 NOTE — Progress Notes (Signed)
Hematology and Oncology Follow Up Visit  Ariel [name] [id number] [birth year] 65 y.o. 02/22/2021   Principle Diagnosis:  Diffuse large cell non-Hodgkin's lymphoma-CNS involvement  Current Therapy:   Status post chemotherapy with R-ICE/R-MTV --last cycle of treatment given on 01/18/2021     Interim History:  [name] is comes in for follow-up.  I am A[place]ley amazed as how well she is doing right now.  She has an incredible history.  She was found to have diffuse large cell non-Hodgkin's lymphoma.  This was really by accident.  We treated her with 1 cycle of chemotherapy the office with RICE.  She really had a hard time.  She had to be admitted because of psychological issues.  We found that these were from steroids.  In the hospital, I decided to treat her with R-MTV regimen which would focus more on the CNS lymphoma issue.  She tolerated this very well..  She finally was discharged from the hospital about 3 weeks ago.  She now is currently living at a assisted living facility.  She is at [facility].  She enjoys it there.  She is back to her baseline mental state.  She looks great.  She sounds good.  Her daughter came up from [place] for Christmas and was with her today.  She has had no problems with headache.  There is no blurred vision.  There is no nausea or vomiting.  She has had no abdominal pain.  She has had no change in bowel or bladder habits.  She has had no rashes.  She has had no fever.  She has had no bleeding.  She has had no neuropathy in the hands or feet.  Overall, I would have to say that her performance status is probably ECOG 1.  Medications:  Current Outpatient Medications:    acyclovir (ZOVIRAX) 200 MG capsule, Take 1 capsule (200 mg total) by mouth 2 (two) times daily., Disp: 60 capsule, Rfl: 3   lidocaine-prilocaine (EMLA) cream, Apply to affected area once (Patient not taking: Reported on 01/14/2021), Disp: 30 g, Rfl: 3   OLANZapine zydis (ZYPREXA) 5 MG  disintegrating tablet, Take 1 tablet (5 mg total) by mouth at bedtime., Disp: 30 tablet, Rfl: 3   Pilocarpine HCl (VUITY) 1.25 % SOLN, Place 1 drop into the right eye daily., Disp: 2.5 mL, Rfl: 4  Allergies:  Allergies  Allergen Reactions   Codeine Nausea Only   Plavix [Clopidogrel]     Hives,lip swelling     Past Medical History, Surgical history, Social history, and Family History were reviewed and updated.  Review of Systems: Review of Systems  Constitutional: Negative.   HENT:  Negative.    Eyes: Negative.   Respiratory: Negative.    Cardiovascular: Negative.   Gastrointestinal: Negative.   Endocrine: Negative.   Genitourinary: Negative.    Musculoskeletal: Negative.   Skin: Negative.   Neurological: Negative.   Hematological: Negative.   Psychiatric/Behavioral: Negative.     Physical Exam:  weight is 147 lb (66.7 kg). Her oral temperature is 98.1 F (36.7 C). Her blood pressure is 138/58 (abnormal) and her pulse is 68. Her respiration is 18 and oxygen saturation is 100%.   Wt Readings from Last 3 Encounters:  02/22/21 147 lb (66.7 kg)  01/24/21 138 lb 14.2 oz (63 kg)  12/30/20 143 lb (64.9 kg)    Physical Exam Vitals reviewed.  HENT:     Head: Normocephalic and atraumatic.  Eyes:     Pupils: Pupils are  equal, round, and reactive to light.  Cardiovascular:     Rate and Rhythm: Normal rate and regular rhythm.     Heart sounds: Normal heart sounds.  Pulmonary:     Effort: Pulmonary effort is normal.     Breath sounds: Normal breath sounds.  Abdominal:     General: Bowel sounds are normal.     Palpations: Abdomen is soft.  Musculoskeletal:        General: No tenderness or deformity. Normal range of motion.     Cervical back: Normal range of motion.  Lymphadenopathy:     Cervical: No cervical adenopathy.  Skin:    General: Skin is warm and dry.     Findings: No erythema or rash.  Neurological:     Mental Status: She is alert and oriented to person,  place, and time.  Psychiatric:        Behavior: Behavior normal.        Thought Content: Thought content normal.        Judgment: Judgment normal.     Lab Results  Component Value Date   WBC 6.3 02/22/2021   HGB 10.9 (L) 02/22/2021   HCT 33.5 (L) 02/22/2021   MCV 87.5 02/22/2021   PLT 218 02/22/2021     Chemistry      Component Value Date/Time   NA 139 02/22/2021 1214   K 3.7 02/22/2021 1214   CL 102 02/22/2021 1214   CO2 30 02/22/2021 1214   BUN 20 02/22/2021 1214   CREATININE 0.92 02/22/2021 1214   CREATININE 0.68 11/18/2012 1439      Component Value Date/Time   CALCIUM 9.7 02/22/2021 1214   ALKPHOS 97 02/22/2021 1214   AST 22 02/22/2021 1214   ALT 23 02/22/2021 1214   BILITOT 0.4 02/22/2021 1214      Impression and Plan: [name] is a very charming 65 year old white female.  She initially came to [place] with iron deficiency anemia.  We subsequently found that she had diffuse large cell non-Hodgkin's lymphoma.  It had traveled to her brain.  Of note, when she was in the hospital, we did do MRIs of the brain which showed a very nice response.  Most of the lymphoma was gone.  We did a CT of the abdomen and pelvis while she was in the hospital after treatment.  This also showed that most of the lymphoma was gone.  We will have to set her up with a PET scan.  We will have to get this done hopefully next week.  She will have to be admitted next week for treatment.  What I would like to do is alternate chemotherapy cycles between RICE and R-MTV.  Again she tolerated both treatments well.  Once we stop the steroids, she really had no problems.  I am just glad that she is back to her baseline.  We will see what her iron studies show.  I am sure we give her iron in the hospital.  Again I really am trying to cure this lymphoma.  I know this is an aggressive protocol but I think that she could certainly tolerate it.  She will definitely need G-CSF after the chemotherapy next  week.  We can do this in the office.  I will plan to get her back to the office once I know when she is going to be discharged from the hospital.   Volanda Napoleon, MD 12/27/20225:10 PM

## 2021-02-22 NOTE — Progress Notes (Signed)
Patient seen for assessment prior to her third cycle of treatment. She will need admission to [facility] on 03/03/2021 for RICE regimen.   Spoke with Shirlean Mylar in bed placement and admission scheduled for 03/03/2021. Message sent to the inpatient chemo team.   Oncology Nurse Navigator Documentation  Oncology Nurse Navigator Flowsheets 02/22/2021  Abnormal Finding Date -  Confirmed Diagnosis Date -  Phase of Treatment -  Chemotherapy Actual Start Date: -  Navigator Follow Up Date: 03/03/2021  Navigator Follow Up Reason: Other:  Navigator Location [facility]  Navigator Encounter Type Telephone;Appt/Treatment Plan Review  Telephone Outgoing Call;Appt Confirmation/Clarification  Treatment Initiated Date -  Patient Visit Type MedOnc  Treatment Phase Active Tx  Barriers/Navigation Needs Coordination of Care  Education -  Interventions Coordination of Care  Acuity Level 2-Minimal Needs (1-2 Barriers Identified)  Referrals -  Coordination of Care Other  Education Method -  Support Groups/Services Friends and Family  Time Spent with Patient 30

## 2021-02-23 ENCOUNTER — Ambulatory Visit (HOSPITAL_COMMUNITY)
Admission: RE | Admit: 2021-02-23 | Discharge: 2021-02-23 | Disposition: A | Discharge status: Home / Self Care | Payer: Medicare Other | Source: Ambulatory Visit | Attending: Hematology & Oncology | Admitting: Hematology & Oncology

## 2021-02-23 ENCOUNTER — Other Ambulatory Visit: Payer: Self-pay | Admitting: Pharmacist

## 2021-02-23 DIAGNOSIS — C8510 Unspecified B-cell lymphoma, unspecified site: Secondary | ICD-10-CM | POA: Diagnosis not present

## 2021-02-23 DIAGNOSIS — G939 Disorder of brain, unspecified: Secondary | ICD-10-CM | POA: Diagnosis not present

## 2021-02-23 DIAGNOSIS — D7389 Other diseases of spleen: Secondary | ICD-10-CM | POA: Diagnosis not present

## 2021-02-23 DIAGNOSIS — I700 Atherosclerosis of aorta: Secondary | ICD-10-CM | POA: Diagnosis not present

## 2021-02-23 DIAGNOSIS — C8590 Non-Hodgkin lymphoma, unspecified, unspecified site: Secondary | ICD-10-CM | POA: Diagnosis not present

## 2021-02-23 LAB — FERRITIN: Ferritin: 1579 ng/mL — ABNORMAL HIGH (ref 11–307)

## 2021-02-23 LAB — GLUCOSE, CAPILLARY: Glucose-Capillary: 107 mg/dL — ABNORMAL HIGH (ref 70–99)

## 2021-02-23 MED ORDER — FLUDEOXYGLUCOSE F - 18 (FDG) INJECTION
7.3000 | Freq: Once | INTRAVENOUS | Status: AC | PRN
  Administered 2021-02-23: 09:00:00 7.26 via INTRAVENOUS

## 2021-02-23 NOTE — Progress Notes (Signed)
Steroids (dexamethasone) removed from [facility] per Dr. Antonieta Pert instructions. Udenyca changed to biosimilar product Ziextenzo per insurance preference.

## 2021-02-24 ENCOUNTER — Encounter: Payer: Self-pay | Admitting: *Deleted

## 2021-02-24 NOTE — Progress Notes (Signed)
Volanda Napoleon, MD  P Onc Nurse Hp Call - you are basically in remission!!!  God is GREAT!!!  Happy New Year!!  [employer] and spoke with patient. Relayed PET scan results. Also reviewed her admission for next Thursday. She knows to call the unit that morning to find out what time she should be there.   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

## 2021-03-01 ENCOUNTER — Other Ambulatory Visit: Payer: Self-pay | Admitting: Hematology & Oncology

## 2021-03-01 DIAGNOSIS — H43392 Other vitreous opacities, left eye: Secondary | ICD-10-CM | POA: Diagnosis not present

## 2021-03-01 DIAGNOSIS — H35363 Drusen (degenerative) of macula, bilateral: Secondary | ICD-10-CM | POA: Diagnosis not present

## 2021-03-01 DIAGNOSIS — H353121 Nonexudative age-related macular degeneration, left eye, early dry stage: Secondary | ICD-10-CM | POA: Diagnosis not present

## 2021-03-01 DIAGNOSIS — H35452 Secondary pigmentary degeneration, left eye: Secondary | ICD-10-CM | POA: Diagnosis not present

## 2021-03-01 DIAGNOSIS — C8510 Unspecified B-cell lymphoma, unspecified site: Secondary | ICD-10-CM

## 2021-03-01 DIAGNOSIS — H35362 Drusen (degenerative) of macula, left eye: Secondary | ICD-10-CM | POA: Diagnosis not present

## 2021-03-01 DIAGNOSIS — H35721 Serous detachment of retinal pigment epithelium, right eye: Secondary | ICD-10-CM | POA: Diagnosis not present

## 2021-03-01 DIAGNOSIS — H353112 Nonexudative age-related macular degeneration, right eye, intermediate dry stage: Secondary | ICD-10-CM | POA: Diagnosis not present

## 2021-03-03 ENCOUNTER — Encounter: Payer: Self-pay | Admitting: *Deleted

## 2021-03-03 NOTE — Progress Notes (Signed)
Received notification from the inpatient director for Oncology that there will be no bed available today for planned admission. There are large numbers of patients waiting in the ED and they must be roomed prior to accepting outside admissions.   Reviewed with Dr Marin Olp. He states that patient must be admitted for treatment. He asks if patient can be guaranteed a bed if he delays to Monday.   Spoke with administration at the hospital and they are hopeful, but cannot guarantee an admission for Monday. Dr Marin Olp will defer admission for Monday.   Patient called and notified of delay.   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

## 2021-03-04 ENCOUNTER — Encounter: Payer: Self-pay | Admitting: Hematology & Oncology

## 2021-03-07 ENCOUNTER — Encounter (HOSPITAL_COMMUNITY): Payer: Self-pay | Admitting: Hematology & Oncology

## 2021-03-07 ENCOUNTER — Other Ambulatory Visit: Payer: Self-pay

## 2021-03-07 ENCOUNTER — Inpatient Hospital Stay (HOSPITAL_COMMUNITY)
Admission: AD | Admit: 2021-03-07 | Discharge: 2021-03-12 | DRG: 847 | Disposition: A | Discharge status: Home / Self Care | Payer: Medicare Other | Attending: Hematology & Oncology | Admitting: Hematology & Oncology

## 2021-03-07 DIAGNOSIS — C8333 Diffuse large B-cell lymphoma, intra-abdominal lymph nodes: Secondary | ICD-10-CM

## 2021-03-07 DIAGNOSIS — Z8673 Personal history of transient ischemic attack (TIA), and cerebral infarction without residual deficits: Secondary | ICD-10-CM

## 2021-03-07 DIAGNOSIS — G9389 Other specified disorders of brain: Secondary | ICD-10-CM | POA: Diagnosis not present

## 2021-03-07 DIAGNOSIS — Z803 Family history of malignant neoplasm of breast: Secondary | ICD-10-CM | POA: Diagnosis not present

## 2021-03-07 DIAGNOSIS — E7800 Pure hypercholesterolemia, unspecified: Secondary | ICD-10-CM | POA: Diagnosis not present

## 2021-03-07 DIAGNOSIS — Z5111 Encounter for antineoplastic chemotherapy: Secondary | ICD-10-CM | POA: Diagnosis not present

## 2021-03-07 DIAGNOSIS — Z885 Allergy status to narcotic agent status: Secondary | ICD-10-CM

## 2021-03-07 DIAGNOSIS — D6481 Anemia due to antineoplastic chemotherapy: Secondary | ICD-10-CM | POA: Diagnosis not present

## 2021-03-07 DIAGNOSIS — Z20822 Contact with and (suspected) exposure to covid-19: Secondary | ICD-10-CM | POA: Diagnosis present

## 2021-03-07 DIAGNOSIS — Z8619 Personal history of other infectious and parasitic diseases: Secondary | ICD-10-CM

## 2021-03-07 DIAGNOSIS — C8339 Diffuse large B-cell lymphoma, extranodal and solid organ sites: Secondary | ICD-10-CM | POA: Diagnosis not present

## 2021-03-07 DIAGNOSIS — T451X5A Adverse effect of antineoplastic and immunosuppressive drugs, initial encounter: Secondary | ICD-10-CM | POA: Diagnosis present

## 2021-03-07 DIAGNOSIS — C8332 Diffuse large B-cell lymphoma, intrathoracic lymph nodes: Secondary | ICD-10-CM | POA: Diagnosis not present

## 2021-03-07 DIAGNOSIS — C8330 Diffuse large B-cell lymphoma, unspecified site: Secondary | ICD-10-CM

## 2021-03-07 DIAGNOSIS — Z888 Allergy status to other drugs, medicaments and biological substances status: Secondary | ICD-10-CM

## 2021-03-07 DIAGNOSIS — C8510 Unspecified B-cell lymphoma, unspecified site: Secondary | ICD-10-CM

## 2021-03-07 DIAGNOSIS — Z85820 Personal history of malignant melanoma of skin: Secondary | ICD-10-CM

## 2021-03-07 LAB — COMPREHENSIVE METABOLIC PANEL
ALT: 24 U/L (ref 0–44)
AST: 21 U/L (ref 15–41)
Albumin: 3.6 g/dL (ref 3.5–5.0)
Alkaline Phosphatase: 86 U/L (ref 38–126)
Anion gap: 7 (ref 5–15)
BUN: 17 mg/dL (ref 8–23)
CO2: 30 mmol/L (ref 22–32)
Calcium: 9.4 mg/dL (ref 8.9–10.3)
Chloride: 101 mmol/L (ref 98–111)
Creatinine, Ser: 0.78 mg/dL (ref 0.44–1.00)
GFR, Estimated: >60 mL/min (ref >60)
Glucose, Bld: 94 mg/dL (ref 70–99)
Potassium: 3.9 mmol/L (ref 3.5–5.1)
Sodium: 138 mmol/L (ref 135–145)
Total Bilirubin: 0.6 mg/dL (ref 0.3–1.2)
Total Protein: 6.4 g/dL — ABNORMAL LOW (ref 6.5–8.1)

## 2021-03-07 LAB — URINALYSIS, ROUTINE W REFLEX MICROSCOPIC
Bilirubin Urine: NEGATIVE
Glucose, UA: NEGATIVE mg/dL
Hgb urine dipstick: NEGATIVE
Ketones, ur: NEGATIVE mg/dL
Leukocytes,Ua: NEGATIVE
Nitrite: NEGATIVE
Protein, ur: NEGATIVE mg/dL
Specific Gravity, Urine: 1.006 (ref 1.005–1.030)
pH: 7 (ref 5.0–8.0)

## 2021-03-07 LAB — CBC
HCT: 32 % — ABNORMAL LOW (ref 36.0–46.0)
Hemoglobin: 10.6 g/dL — ABNORMAL LOW (ref 12.0–15.0)
MCH: 29.7 pg (ref 26.0–34.0)
MCHC: 33.1 g/dL (ref 30.0–36.0)
MCV: 89.6 fL (ref 80.0–100.0)
Platelets: 222 10*3/uL (ref 150–400)
RBC: 3.57 MIL/uL — ABNORMAL LOW (ref 3.87–5.11)
RDW: 18.3 % — ABNORMAL HIGH (ref 11.5–15.5)
WBC: 5.8 10*3/uL (ref 4.0–10.5)
nRBC: 0 % (ref 0.0–0.2)

## 2021-03-07 LAB — RESP PANEL BY RT-PCR (FLU A&B, COVID) ARPGX2
Influenza A by PCR: NEGATIVE
Influenza B by PCR: NEGATIVE
SARS Coronavirus 2 by RT PCR: NEGATIVE

## 2021-03-07 LAB — MAGNESIUM: Magnesium: 2 mg/dL (ref 1.7–2.4)

## 2021-03-07 LAB — PHOSPHORUS: Phosphorus: 4 mg/dL (ref 2.5–4.6)

## 2021-03-07 MED ORDER — LIDOCAINE-PRILOCAINE 2.5-2.5 % EX CREA: TOPICAL_CREAM | Freq: Once | CUTANEOUS | Status: DC

## 2021-03-07 MED ORDER — SODIUM CHLORIDE 0.9 % IV SOLN
375.0000 mg/m2 | Freq: Once | INTRAVENOUS | Status: DC
  Filled 2021-03-07: qty 70

## 2021-03-07 MED ORDER — PROCHLORPERAZINE EDISYLATE 10 MG/2ML IJ SOLN: 10.0000 mg | Freq: Once | INTRAMUSCULAR | Status: DC

## 2021-03-07 MED ORDER — SODIUM CHLORIDE 0.9 % IV SOLN
100.0000 mg/m2 | Freq: Once | INTRAVENOUS | Status: DC
  Filled 2021-03-07: qty 9

## 2021-03-07 MED ORDER — ACETAMINOPHEN 325 MG PO TABS: 650.0000 mg | ORAL_TABLET | Freq: Once | ORAL | Status: DC

## 2021-03-07 MED ORDER — ACYCLOVIR 400 MG PO TABS
200.0000 mg | ORAL_TABLET | Freq: Once | ORAL | Status: AC
  Administered 2021-03-07: 200 mg via ORAL
  Filled 2021-03-07: qty 1

## 2021-03-07 MED ORDER — DIPHENHYDRAMINE HCL 25 MG PO CAPS: 50.0000 mg | ORAL_CAPSULE | Freq: Once | ORAL | Status: DC

## 2021-03-07 MED ORDER — ENOXAPARIN SODIUM 40 MG/0.4ML IJ SOSY
40.0000 mg | PREFILLED_SYRINGE | INTRAMUSCULAR | Status: DC
  Administered 2021-03-07 – 2021-03-11 (×5): 40 mg via SUBCUTANEOUS
  Filled 2021-03-07 (×5): qty 0.4

## 2021-03-07 MED ORDER — SODIUM CHLORIDE 0.9 % IV SOLN: INTRAVENOUS | Status: DC

## 2021-03-07 NOTE — H&P (Addendum)
[place]  [phone] [phone]   MEDICAL ONCOLOGY - ADMISSION H&P  Reason for Admission: Diffuse large cell non-Hodgkin's lymphoma with CNS involvement, administration of systemic chemotherapy  HPI: [name] is a 66 year old female with a past medical history significant for hyperlipidemia, iron deficiency anemia, TIA in August 2022, melanoma of the right hip diagnosed in 2021, stage Ib, vitreous hemorrhage of the right eyes status post vitrectomy 12/07/2020, diffuse large cell non-Hodgkin's lymphoma with CNS involvement currently receiving systemic chemotherapy.  The patient was hospitalized in November 2022 for altered mental status.  During that hospitalization, she received her first cycle of systemic chemotherapy consisting of R-ICE/R-MTV.  Overall, she seems to tolerate her systemic chemotherapy well overall.  This morning, the patient reports that she is feeling well.  She has no specific complaints today.  She denies headaches, dizziness, vision changes, chest pain, shortness of breath, abdominal pain, nausea, vomiting, constipation, diarrhea.  No bleeding reported.  She is ready to begin her second cycle of systemic chemotherapy today.  The patient is being admitted today to begin cycle #2 of R-ICE/R-MTV.   Past Medical History:  Diagnosis Date   Chicken pox    Complication of anesthesia    Per patient, very slow to wake up after anesthesia   Goals of care, counseling/discussion 12/22/2020   High cholesterol    High grade B-cell lymphoma ([place]) 12/22/2020   Hyperglycemia 11/10/2020   Melanoma ([place]) 2021   right hip   PONV (postoperative nausea and vomiting)    Urinary incontinence   :   Past Surgical History:  Procedure Laterality Date   AIR/FLUID EXCHANGE Right 12/07/2020   Procedure: AIR/FLUID EXCHANGE;  Surgeon: Jalene Mullet, MD;  Location: [place];  Service: Ophthalmology;  Laterality: Right;   APPENDECTOMY  1962   BIOPSY   12/14/2020   Procedure: BIOPSY;  Surgeon: Rush Landmark Telford Nab., MD;  Location: [facility];  Service: Gastroenterology;;   ESOPHAGOGASTRODUODENOSCOPY (EGD) WITH PROPOFOL N/A 12/14/2020   Procedure: ESOPHAGOGASTRODUODENOSCOPY (EGD) WITH PROPOFOL;  Surgeon: Irving Copas., MD;  Location: [facility];  Service: Gastroenterology;  Laterality: N/A;   EXCISION MELANOMA WITH SENTINEL LYMPH NODE BIOPSY Right 10/09/2019   Procedure: WIDE LOCAL EXCISION WITH ADVANCEMENT FLAP CLOSURE RIGHT HIP MELANOMA WITH SENTINEL LYMPH NODE BIOPSY;  Surgeon: Stark Klein, MD;  Location: [place];  Service: General;  Laterality: Right;   FINE NEEDLE ASPIRATION  12/14/2020   Procedure: FINE NEEDLE ASPIRATION (FNA) LINEAR;  Surgeon: Irving Copas., MD;  Location: [place];  Service: Gastroenterology;;   HERNIA REPAIR  2009   IR IMAGING GUIDED PORT INSERTION  12/30/2020   MOUTH SURGERY  11/2015   gum surgery and bone implant   PARS PLANA VITRECTOMY Right 12/07/2020   Procedure: PARS PLANA VITRECTOMY WITH 25 GAUGE;  Surgeon: Jalene Mullet, MD;  Location: [place];  Service: Ophthalmology;  Laterality: Right;   PHOTOCOAGULATION WITH LASER Right 12/07/2020   Procedure: PHOTOCOAGULATION WITH ENDOLASER PANRITINAL COAGULATION;  Surgeon: Jalene Mullet, MD;  Location: [place];  Service: Ophthalmology;  Laterality: Right;   UPPER ESOPHAGEAL ENDOSCOPIC ULTRASOUND (EUS) N/A 12/14/2020   Procedure: UPPER ESOPHAGEAL ENDOSCOPIC ULTRASOUND (EUS);  Surgeon: Irving Copas., MD;  Location: [place];  Service: Gastroenterology;  Laterality: N/A;   WISDOM TOOTH EXTRACTION    :   No current facility-administered medications for this encounter.      Allergies  Allergen Reactions   Codeine Nausea Only   Decadron [Dexamethasone]    Plavix [Clopidogrel]  Hives,lip swelling    Prednisone     Steroids caused psychological issues   :   Family History  Problem Relation Age of Onset   Coronary  artery disease Father        died at 83   COPD Father    Alcohol abuse Father    Diabetes Mellitus II Father    Coronary artery disease Sister    Cancer Sister        breast   Heart attack Brother    Hyperlipidemia Other        died 57- "natural causes"   Hyperlipidemia Other        4 siblings    Hypertension Other        3 siblings  :   Social History   Socioeconomic History   Marital status: Widowed    Spouse name: Not on file   Number of children: Not on file   Years of education: Not on file   Highest education level: Not on file  Occupational History   Occupation: retired  Tobacco Use   Smoking status: Never   Smokeless tobacco: Never  Vaping Use   Vaping Use: Never used  Substance and Sexual Activity   Alcohol use: Never   Drug use: Never   Sexual activity: Not Currently  Other Topics Concern   Not on file  Social History Narrative   Lives with husband   One Son- lives locally, 1 grand-daughter   Daughter- Sport and exercise psychologist. [place]   Completed Hotel manager school   Works as a Probation officer.         Social Determinants of Radio broadcast assistant Strain: Low Risk    Difficulty of Paying Living Expenses: Not hard at all  Food Insecurity: No Food Insecurity   Worried About Charity fundraiser in the Last Year: Never true   Arboriculturist in the Last Year: Never true  Transportation Needs: No Transportation Needs   Lack of Transportation (Medical): No   Lack of Transportation (Non-Medical): No  Physical Activity: Sufficiently Active   Days of Exercise per Week: 4 days   Minutes of Exercise per Session: 40 min  Stress: No Stress Concern Present   Feeling of Stress : Not at all  Social Connections: Moderately Isolated   Frequency of Communication with Friends and Family: More than three times a week   Frequency of Social Gatherings with Friends and Family: More than three times a week   Attends Religious Services: More than 4 times per year   Active Member  of [employer] or Organizations: No   Attends Archivist Meetings: Never   Marital Status: Widowed  Human resources officer Violence: Not At Risk   Fear of Current or Ex-Partner: No   Emotionally Abused: No   Physically Abused: No   Sexually Abused: No  :  Review of Systems: A comprehensive 14 point review of systems was negative except as noted in the HPI.  Exam: No data found.  General:  well-nourished in no acute distress.   Eyes:  no scleral icterus.   ENT:  There were no oropharyngeal lesions.   Lymphatics:  Negative cervical, supraclavicular or axillary adenopathy.   Respiratory: lungs were clear bilaterally without wheezing or crackles.   Cardiovascular:  Regular rate and rhythm, S1/S2, without murmur, rub or gallop.  There was no pedal edema.   GI:  abdomen was soft, flat, nontender, nondistended, without organomegaly.     Skin exam  was without echymosis, petichae.   Neuro exam was nonfocal. Patient was alert and oriented.  Attention was good.   Language was appropriate.  Mood was normal without depression.  Speech was not pressured.  Thought content was not tangential.     Lab Results  Component Value Date   WBC 6.3 02/22/2021   HGB 10.9 (L) 02/22/2021   HCT 33.5 (L) 02/22/2021   PLT 218 02/22/2021   GLUCOSE 93 02/22/2021   CHOL 134 10/07/2020   TRIG 36 10/07/2020   HDL 44 10/07/2020   LDLCALC 83 10/07/2020   ALT 23 02/22/2021   AST 22 02/22/2021   NA 139 02/22/2021   K 3.7 02/22/2021   CL 102 02/22/2021   CREATININE 0.92 02/22/2021   BUN 20 02/22/2021   CO2 30 02/22/2021    MR BRAIN W WO CONTRAST  Result Date: 02/07/2021 CLINICAL DATA:  Cerebral basal spasm.  CNS lymphoma. EXAM: MRI HEAD WITHOUT AND WITH CONTRAST TECHNIQUE: Multiplanar, multiecho pulse sequences of the brain and surrounding structures were obtained without and with intravenous contrast. CONTRAST:  14mL GADAVIST GADOBUTROL 1 MMOL/ML IV SOLN COMPARISON:  01/21/2021.  01/15/2021.  01/02/2021.  FINDINGS: Brain: No new abnormality affects the brain. The brainstem and cerebellum remain normal. Both cerebral hemispheres show a pattern of patchy T2 and FLAIR signal within the deep and subcortical white matter which is unchanged. Previously seen enhancing lesion of the left posterior temporal lobe shows continued reduction in the amount of adjacent edema and shows only minimal residual enhancement along the walls and at the floor. Previously seen enhancing lesion at the left posterior parietal cortex does not show any edema or enhancement. No hydrocephalus. No extra-axial collection. Vascular: Major vessels at the base of the brain show flow. Skull and upper cervical spine: Negative appearance of the skull. Chronic marrow abnormality of the cervical spine. Sinuses/Orbits: Clear/normal Other: None IMPRESSION: Continued favorable response to treatment. Continued reduction in the amount of vasogenic edema in the left posterior temporal lobe. Previously seen enhancing lesion shows slight contraction, with only minimal wall and floor enhancement. No worsening or progressive finding. Previously seen enhancing left posterior parietal cortical lesion shows resolution of edema without any enhancement presently. Electronically Signed   By: Nelson Chimes M.D.   On: 02/07/2021 13:16   CT CHEST ABDOMEN PELVIS W CONTRAST  Result Date: 02/08/2021 CLINICAL DATA:  Status post 2 cycles of chemotherapy. Evaluate treatment response of lymphoma. Asymptomatic. Prior melanoma. EXAM: CT CHEST, ABDOMEN, AND PELVIS WITH CONTRAST TECHNIQUE: Multidetector CT imaging of the chest, abdomen and pelvis was performed following the standard protocol during bolus administration of intravenous contrast. CONTRAST:  75mL OMNIPAQUE IOHEXOL 350 MG/ML SOLN COMPARISON:  12/09/2020 chest abdomen and pelvic CTs. FINDINGS: CT CHEST FINDINGS Cardiovascular: Right Port-A-Cath tip mid to low right atrium. Aortic atherosclerosis. Normal heart size,  without pericardial effusion. No central pulmonary embolism, on this non-dedicated study. Mediastinum/Nodes: No axillary adenopathy. No mediastinal or hilar adenopathy. Lungs/Pleura: No pleural fluid.  Clear lungs. Musculoskeletal: No acute osseous abnormality. CT ABDOMEN PELVIS FINDINGS Hepatobiliary: Hepatomegaly at 20 cm craniocaudal. Normal gallbladder, without biliary ductal dilatation. Pancreas: Normal, without mass or ductal dilatation. Spleen: Normal in size, without focal abnormality. Adrenals/Urinary Tract: Normal left adrenal gland. 1.3 cm right adrenal nodule is similar to on the prior exam (when remeasured). This was consistent with an adenoma on prior PET. Decreased corticomedullary differentiation within both kidneys, greater right than left. No hydronephrosis. Normal urinary bladder. Stomach/Bowel: Normal stomach, without wall thickening. Colonic stool  burden suggests constipation. Normal terminal ileum. Normal small bowel. Vascular/Lymphatic: Aortic atherosclerosis. Index abdominal retroperitoneal node within the left periaortic space measures 7 mm on 72/2 versus 13 mm on the prior CT. Gastrohepatic ligament node measures 1.0 cm on 63/2 versus 1.6 cm on the prior exam. Portacaval node measures 8 mm on 66/2 versus 12 mm on the prior exam. Right inguinal surgical clips.  No pelvic sidewall adenopathy. Reproductive: Normal uterus and adnexa. Other: No significant free fluid. Mild pelvic floor laxity. No free intraperitoneal air. Musculoskeletal: No acute osseous abnormality. Mild convex left thoracolumbar spine curvature. IMPRESSION: 1. Response to therapy of abdominal adenopathy since 12/09/2020 CT. No new or progressive disease. 2. The splenic lesions on the prior CT have resolved. 3. Similar right adrenal adenoma. 4. New decreased corticomedullary differentiation within both kidneys, worse on the right. Correlate with renal insufficiency. Differential considerations include pyelonephritis. Renal  lymphoma could have this appearance, but is felt less likely given response to therapy elsewhere. 5.  Aortic Atherosclerosis (ICD10-I70.0). 6. Hepatomegaly. Electronically Signed   By: Abigail Miyamoto M.D.   On: 02/08/2021 15:08   NM PET Image Restag (PS) Skull Base To Thigh  Result Date: 02/24/2021 CLINICAL DATA:  Subsequent treatment strategy for non-Hodgkin's lymphoma. EXAM: NUCLEAR MEDICINE PET SKULL BASE TO THIGH TECHNIQUE: 7.3 mCi F-18 FDG was injected intravenously. Full-ring PET imaging was performed from the skull base to thigh after the radiotracer. CT data was obtained and used for attenuation correction and anatomic localization. Fasting blood glucose: 107 mg/dl COMPARISON:  Multiple exams, including PET-CT 12/27/2020 and diagnostic CT from 02/08/2021 FINDINGS: Mediastinal blood pool activity: SUV max 2.6 Liver activity: SUV max 3.3 NECK: Left posterior temporal focal hypoactivity associated with the known brain lesion. The site has substantially less than brain activity currently but was previously probably hypermetabolic, although only tangentially included on the prior exam. No new lesions in the visualized intracranial volume identified. Mildly accentuated lingual activity along the periphery of the tongue, probably physiologic/incidental. Palatine tonsillar activity with maximum SUV 4.9 on the right and 4.2 on the left, probably physiologic given the lack prior involvement in this region. Mild focal accentuated activity in the right thyroid lobe with maximum SUV of 3.7, also previously 3.7. Possible underlying isodense nodule in this vicinity given the slight relative prominence of the right lobe compared to the left. Incidental CT findings: None CHEST: Marked improvement in prior left supraclavicular adenopathy, previous 0.7 cm left supraclavicular node with maximum SUV of 7.6 currently measures about 0.2 cm in diameter with maximum SUV 1.0 (Deauville 2). A 0.3 cm right paraesophageal lymph node  on image 78 series 4 has a maximum SUV of 2.1 similar to surrounding background tissues (Deauville 2), previous maximum SUV 4.0. Accentuated activity in the left hilar/infrahilar region noted along the pulmonary artery without well-defined separate lymph node, maximum SUV 3.1 (Deauville 3). Former maximum SUV was 2.1 in this region. Distal esophageal accentuated activity with maximum SUV of 3.9 (formerly 3.7). This is most likely to be physiologic. Incidental CT findings: Right Port-A-Cath tip: Right atrium. Atherosclerotic calcification of the aortic arch. ABDOMEN/PELVIS: The majority of the prior right gastric, porta hepatis, retrocrural, periaortic, retroperitoneal, and upper pelvic adenopathy has resolved (Deauville 1). An index remaining right retrocrural lymph node measuring 0.6 cm in short axis on image 115 of series 4 has maximum SUV of 2.1 (Deauville 2). This previously measured 1.1 cm in short axis with maximum SUV of 14.4. A left periaortic lymph node measuring 0.7 cm in short  axis on image 131 of series 4 has maximum SUV of 1.5 (Deauville 2). This previously measured 1.6 cm in short axis with a maximum SUV of 8.8. The previously hypermetabolic splenic lesions have resolved. No current splenomegaly. Incidental CT findings: 1.6 by 1.3 cm right adrenal mass is nonspecific on today's exam but not hypermetabolic and previously characterized as adenoma. SKELETON: Low-grade diffuse marrow activity is mild but could indicate granulocyte stimulation. No focal bony activity is observed. Incidental CT findings: none IMPRESSION: 1. Marked improvement compared to prior PET-CT, with resolution of much of the thoracic and abdominopelvic adenopathy (Deauville 1) and with remaining small lymph nodes primarily Deauville 2. Prior splenic lesions have resolved (Deauville 1). 2. A focus of activity along the left hilar/infrahilar region is the sole exception, with some localized Deauville 3 activity not seen previously in  the vicinity of the left pulmonary artery. Separate lymph node not well appreciated on the CT data. Attention to this region on follow up suggested. 3. The previous left posterior temporal lobe lesion in the brain is currently relatively photopenic compared to the surrounding brain parenchyma. 4. Focal accentuated activity in the right thyroid lobe with possible underlying isodense nodule in this vicinity. Patient had prior right and isthmic nodules biopsied in 2015, but the current nodule of concern is left-sided. Recommend thyroid US and biopsy (ref: J Am Coll Radiol. 2015 Feb;12(2): 143-50). 5.  Aortic Atherosclerosis (ICD10-I70.0). Electronically Signed   By: Van Clines M.D.   On: 02/24/2021 08:19   VAS [place] LOWER EXTREMITY VENOUS (DVT)  Result Date: 02/08/2021  Lower Venous DVT Study Patient Name:  Ariel SCHOMBURG  Date of Exam:   02/08/2021 Medical Rec #: [id number]     Accession #:    [id number] Date of Birth: 1955-12-20     Patient Gender: F Patient Age:   10 years Exam Location:  [facility] Procedure:      VAS [place] LOWER EXTREMITY VENOUS (DVT) Referring Phys: Burney Gauze --------------------------------------------------------------------------------  Indications: Edema.  Risk Factors: Cancer patient on chemotherapy. Comparison Study: No previous exams Performing Technologist: Jody Hill RVT, RDMS  Examination Guidelines: A complete evaluation includes B-mode imaging, spectral Doppler, color Doppler, and power Doppler as needed of all accessible portions of each vessel. Bilateral testing is considered an integral part of a complete examination. Limited examinations for reoccurring indications may be performed as noted. The reflux portion of the exam is performed with the patient in reverse Trendelenburg.  +---------+---------------+---------+-----------+----------+--------------+  RIGHT     Compressibility Phasicity Spontaneity Properties Thrombus Aging   +---------+---------------+---------+-----------+----------+--------------+  CFV       Full            Yes       Yes                                    +---------+---------------+---------+-----------+----------+--------------+  SFJ       Full                                                             +---------+---------------+---------+-----------+----------+--------------+  FV Prox   Full            Yes       Yes                                    +---------+---------------+---------+-----------+----------+--------------+  FV Mid    Full            Yes       Yes                                    +---------+---------------+---------+-----------+----------+--------------+  FV Distal Full            Yes       Yes                                    +---------+---------------+---------+-----------+----------+--------------+  PFV       Full                                                             +---------+---------------+---------+-----------+----------+--------------+  POP       Full            Yes       Yes                                    +---------+---------------+---------+-----------+----------+--------------+  PTV       Full                                                             +---------+---------------+---------+-----------+----------+--------------+  PERO      Full                                                             +---------+---------------+---------+-----------+----------+--------------+   +---------+---------------+---------+-----------+----------+--------------+  LEFT      Compressibility Phasicity Spontaneity Properties Thrombus Aging  +---------+---------------+---------+-----------+----------+--------------+  CFV       Full            Yes       Yes                                    +---------+---------------+---------+-----------+----------+--------------+  SFJ       Full                                                              +---------+---------------+---------+-----------+----------+--------------+  FV Prox   Full            Yes       Yes                                    +---------+---------------+---------+-----------+----------+--------------+  FV Mid    Full            Yes       Yes                                    +---------+---------------+---------+-----------+----------+--------------+  FV Distal Full            Yes       Yes                                    +---------+---------------+---------+-----------+----------+--------------+  PFV       Full                                                             +---------+---------------+---------+-----------+----------+--------------+  POP       Full            Yes       Yes                                    +---------+---------------+---------+-----------+----------+--------------+  PTV       Full                                                             +---------+---------------+---------+-----------+----------+--------------+  PERO      Full                                                             +---------+---------------+---------+-----------+----------+--------------+     Summary: BILATERAL: - No evidence of deep vein thrombosis seen in the lower extremities, bilaterally. - No evidence of superficial venous thrombosis in the lower extremities, bilaterally. -No evidence of popliteal cyst, bilaterally.   *See table(s) above for measurements and observations. Electronically signed by Deitra Mayo MD on 02/08/2021 at 1:55:54 PM.    Final      MR BRAIN W WO CONTRAST  Result Date: 02/07/2021 CLINICAL DATA:  Cerebral basal spasm.  CNS lymphoma. EXAM: MRI HEAD WITHOUT AND WITH CONTRAST TECHNIQUE: Multiplanar, multiecho pulse sequences of the brain and surrounding structures were obtained without and with intravenous contrast. CONTRAST:  21mL GADAVIST GADOBUTROL 1 MMOL/ML IV SOLN COMPARISON:  01/21/2021.  01/15/2021.  01/02/2021. FINDINGS: Brain: No new  abnormality affects the brain. The brainstem and cerebellum remain normal. Both cerebral hemispheres show a pattern of patchy T2 and FLAIR signal within the deep and subcortical white matter which is unchanged. Previously seen enhancing lesion of the left posterior temporal lobe shows continued reduction in the amount of adjacent edema and shows only minimal residual enhancement along the walls and at the floor. Previously seen enhancing lesion at the left posterior parietal cortex does not show  any edema or enhancement. No hydrocephalus. No extra-axial collection. Vascular: Major vessels at the base of the brain show flow. Skull and upper cervical spine: Negative appearance of the skull. Chronic marrow abnormality of the cervical spine. Sinuses/Orbits: Clear/normal Other: None IMPRESSION: Continued favorable response to treatment. Continued reduction in the amount of vasogenic edema in the left posterior temporal lobe. Previously seen enhancing lesion shows slight contraction, with only minimal wall and floor enhancement. No worsening or progressive finding. Previously seen enhancing left posterior parietal cortical lesion shows resolution of edema without any enhancement presently. Electronically Signed   By: Nelson Chimes M.D.   On: 02/07/2021 13:16   CT CHEST ABDOMEN PELVIS W CONTRAST  Result Date: 02/08/2021 CLINICAL DATA:  Status post 2 cycles of chemotherapy. Evaluate treatment response of lymphoma. Asymptomatic. Prior melanoma. EXAM: CT CHEST, ABDOMEN, AND PELVIS WITH CONTRAST TECHNIQUE: Multidetector CT imaging of the chest, abdomen and pelvis was performed following the standard protocol during bolus administration of intravenous contrast. CONTRAST:  69mL OMNIPAQUE IOHEXOL 350 MG/ML SOLN COMPARISON:  12/09/2020 chest abdomen and pelvic CTs. FINDINGS: CT CHEST FINDINGS Cardiovascular: Right Port-A-Cath tip mid to low right atrium. Aortic atherosclerosis. Normal heart size, without pericardial effusion.  No central pulmonary embolism, on this non-dedicated study. Mediastinum/Nodes: No axillary adenopathy. No mediastinal or hilar adenopathy. Lungs/Pleura: No pleural fluid.  Clear lungs. Musculoskeletal: No acute osseous abnormality. CT ABDOMEN PELVIS FINDINGS Hepatobiliary: Hepatomegaly at 20 cm craniocaudal. Normal gallbladder, without biliary ductal dilatation. Pancreas: Normal, without mass or ductal dilatation. Spleen: Normal in size, without focal abnormality. Adrenals/Urinary Tract: Normal left adrenal gland. 1.3 cm right adrenal nodule is similar to on the prior exam (when remeasured). This was consistent with an adenoma on prior PET. Decreased corticomedullary differentiation within both kidneys, greater right than left. No hydronephrosis. Normal urinary bladder. Stomach/Bowel: Normal stomach, without wall thickening. Colonic stool burden suggests constipation. Normal terminal ileum. Normal small bowel. Vascular/Lymphatic: Aortic atherosclerosis. Index abdominal retroperitoneal node within the left periaortic space measures 7 mm on 72/2 versus 13 mm on the prior CT. Gastrohepatic ligament node measures 1.0 cm on 63/2 versus 1.6 cm on the prior exam. Portacaval node measures 8 mm on 66/2 versus 12 mm on the prior exam. Right inguinal surgical clips.  No pelvic sidewall adenopathy. Reproductive: Normal uterus and adnexa. Other: No significant free fluid. Mild pelvic floor laxity. No free intraperitoneal air. Musculoskeletal: No acute osseous abnormality. Mild convex left thoracolumbar spine curvature. IMPRESSION: 1. Response to therapy of abdominal adenopathy since 12/09/2020 CT. No new or progressive disease. 2. The splenic lesions on the prior CT have resolved. 3. Similar right adrenal adenoma. 4. New decreased corticomedullary differentiation within both kidneys, worse on the right. Correlate with renal insufficiency. Differential considerations include pyelonephritis. Renal lymphoma could have this  appearance, but is felt less likely given response to therapy elsewhere. 5.  Aortic Atherosclerosis (ICD10-I70.0). 6. Hepatomegaly. Electronically Signed   By: Abigail Miyamoto M.D.   On: 02/08/2021 15:08   NM PET Image Restag (PS) Skull Base To Thigh  Result Date: 02/24/2021 CLINICAL DATA:  Subsequent treatment strategy for non-Hodgkin's lymphoma. EXAM: NUCLEAR MEDICINE PET SKULL BASE TO THIGH TECHNIQUE: 7.3 mCi F-18 FDG was injected intravenously. Full-ring PET imaging was performed from the skull base to thigh after the radiotracer. CT data was obtained and used for attenuation correction and anatomic localization. Fasting blood glucose: 107 mg/dl COMPARISON:  Multiple exams, including PET-CT 12/27/2020 and diagnostic CT from 02/08/2021 FINDINGS: Mediastinal blood pool activity: SUV max 2.6 Liver activity:  SUV max 3.3 NECK: Left posterior temporal focal hypoactivity associated with the known brain lesion. The site has substantially less than brain activity currently but was previously probably hypermetabolic, although only tangentially included on the prior exam. No new lesions in the visualized intracranial volume identified. Mildly accentuated lingual activity along the periphery of the tongue, probably physiologic/incidental. Palatine tonsillar activity with maximum SUV 4.9 on the right and 4.2 on the left, probably physiologic given the lack prior involvement in this region. Mild focal accentuated activity in the right thyroid lobe with maximum SUV of 3.7, also previously 3.7. Possible underlying isodense nodule in this vicinity given the slight relative prominence of the right lobe compared to the left. Incidental CT findings: None CHEST: Marked improvement in prior left supraclavicular adenopathy, previous 0.7 cm left supraclavicular node with maximum SUV of 7.6 currently measures about 0.2 cm in diameter with maximum SUV 1.0 (Deauville 2). A 0.3 cm right paraesophageal lymph node on image 78 series 4 has  a maximum SUV of 2.1 similar to surrounding background tissues (Deauville 2), previous maximum SUV 4.0. Accentuated activity in the left hilar/infrahilar region noted along the pulmonary artery without well-defined separate lymph node, maximum SUV 3.1 (Deauville 3). Former maximum SUV was 2.1 in this region. Distal esophageal accentuated activity with maximum SUV of 3.9 (formerly 3.7). This is most likely to be physiologic. Incidental CT findings: Right Port-A-Cath tip: Right atrium. Atherosclerotic calcification of the aortic arch. ABDOMEN/PELVIS: The majority of the prior right gastric, porta hepatis, retrocrural, periaortic, retroperitoneal, and upper pelvic adenopathy has resolved (Deauville 1). An index remaining right retrocrural lymph node measuring 0.6 cm in short axis on image 115 of series 4 has maximum SUV of 2.1 (Deauville 2). This previously measured 1.1 cm in short axis with maximum SUV of 14.4. A left periaortic lymph node measuring 0.7 cm in short axis on image 131 of series 4 has maximum SUV of 1.5 (Deauville 2). This previously measured 1.6 cm in short axis with a maximum SUV of 8.8. The previously hypermetabolic splenic lesions have resolved. No current splenomegaly. Incidental CT findings: 1.6 by 1.3 cm right adrenal mass is nonspecific on today's exam but not hypermetabolic and previously characterized as adenoma. SKELETON: Low-grade diffuse marrow activity is mild but could indicate granulocyte stimulation. No focal bony activity is observed. Incidental CT findings: none IMPRESSION: 1. Marked improvement compared to prior PET-CT, with resolution of much of the thoracic and abdominopelvic adenopathy (Deauville 1) and with remaining small lymph nodes primarily Deauville 2. Prior splenic lesions have resolved (Deauville 1). 2. A focus of activity along the left hilar/infrahilar region is the sole exception, with some localized Deauville 3 activity not seen previously in the vicinity of the left  pulmonary artery. Separate lymph node not well appreciated on the CT data. Attention to this region on follow up suggested. 3. The previous left posterior temporal lobe lesion in the brain is currently relatively photopenic compared to the surrounding brain parenchyma. 4. Focal accentuated activity in the right thyroid lobe with possible underlying isodense nodule in this vicinity. Patient had prior right and isthmic nodules biopsied in 2015, but the current nodule of concern is left-sided. Recommend thyroid US and biopsy (ref: J Am Coll Radiol. 2015 Feb;12(2): 143-50). 5.  Aortic Atherosclerosis (ICD10-I70.0). Electronically Signed   By: Van Clines M.D.   On: 02/24/2021 08:19   VAS [place] LOWER EXTREMITY VENOUS (DVT)  Result Date: 02/08/2021  Lower Venous DVT Study Patient Name:  ANAHLA [name]  Date of  Exam:   02/08/2021 Medical Rec #: [id number]     Accession #:    [id number] Date of Birth: 1956/01/16     Patient Gender: F Patient Age:   78 years Exam Location:  [facility] Procedure:      VAS [place] LOWER EXTREMITY VENOUS (DVT) Referring Phys: Burney Gauze --------------------------------------------------------------------------------  Indications: Edema.  Risk Factors: Cancer patient on chemotherapy. Comparison Study: No previous exams Performing Technologist: Jody Hill RVT, RDMS  Examination Guidelines: A complete evaluation includes B-mode imaging, spectral Doppler, color Doppler, and power Doppler as needed of all accessible portions of each vessel. Bilateral testing is considered an integral part of a complete examination. Limited examinations for reoccurring indications may be performed as noted. The reflux portion of the exam is performed with the patient in reverse Trendelenburg.  +---------+---------------+---------+-----------+----------+--------------+  RIGHT     Compressibility Phasicity Spontaneity Properties Thrombus Aging   +---------+---------------+---------+-----------+----------+--------------+  CFV       Full            Yes       Yes                                    +---------+---------------+---------+-----------+----------+--------------+  SFJ       Full                                                             +---------+---------------+---------+-----------+----------+--------------+  FV Prox   Full            Yes       Yes                                    +---------+---------------+---------+-----------+----------+--------------+  FV Mid    Full            Yes       Yes                                    +---------+---------------+---------+-----------+----------+--------------+  FV Distal Full            Yes       Yes                                    +---------+---------------+---------+-----------+----------+--------------+  PFV       Full                                                             +---------+---------------+---------+-----------+----------+--------------+  POP       Full            Yes       Yes                                    +---------+---------------+---------+-----------+----------+--------------+  PTV       Full                                                             +---------+---------------+---------+-----------+----------+--------------+  PERO      Full                                                             +---------+---------------+---------+-----------+----------+--------------+   +---------+---------------+---------+-----------+----------+--------------+  LEFT      Compressibility Phasicity Spontaneity Properties Thrombus Aging  +---------+---------------+---------+-----------+----------+--------------+  CFV       Full            Yes       Yes                                    +---------+---------------+---------+-----------+----------+--------------+  SFJ       Full                                                              +---------+---------------+---------+-----------+----------+--------------+  FV Prox   Full            Yes       Yes                                    +---------+---------------+---------+-----------+----------+--------------+  FV Mid    Full            Yes       Yes                                    +---------+---------------+---------+-----------+----------+--------------+  FV Distal Full            Yes       Yes                                    +---------+---------------+---------+-----------+----------+--------------+  PFV       Full                                                             +---------+---------------+---------+-----------+----------+--------------+  POP       Full            Yes       Yes                                    +---------+---------------+---------+-----------+----------+--------------+  PTV       Full                                                             +---------+---------------+---------+-----------+----------+--------------+  PERO      Full                                                             +---------+---------------+---------+-----------+----------+--------------+     Summary: BILATERAL: - No evidence of deep vein thrombosis seen in the lower extremities, bilaterally. - No evidence of superficial venous thrombosis in the lower extremities, bilaterally. -No evidence of popliteal cyst, bilaterally.   *See table(s) above for measurements and observations. Electronically signed by Deitra Mayo MD on 02/08/2021 at 1:55:54 PM.    Final     Assessment and Plan:  1.  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  Mild anemia secondary to chemotherapy  -The patient was seen and examined this morning.  She feels well and we will plan to begin cycle #2 of systemic chemotherapy today. -Obtain baseline lab work including CBC, CMET, magnesium, phosphorus, UA. -Begin normal saline at 50 cc/h. -Lovenox for DVT prophylaxis. -Regular  diet. -Continue home acyclovir 200 mg twice daily.  Mikey Bussing, DNP, AGPCNP-BC, AOCNP   ADDENDUM: I agree with the above assessment by Erasmo Downer.  We likely will do an MRI of the brain while she is in the hospital so that we can see how everything looks with respect to the CNS lymphoma which is secondary.  She will start her chemotherapy today.  She will likely need G-CSF support.  We can do this as an outpatient.  So far, she has had very good response.  Her PET scan was pretty much normal.  I would like to plan for a total of 6 cycles of treatment which hopefully will keep her in remission for a while.  [name], MD

## 2021-03-08 ENCOUNTER — Encounter: Payer: Self-pay | Admitting: *Deleted

## 2021-03-08 DIAGNOSIS — C8333 Diffuse large B-cell lymphoma, intra-abdominal lymph nodes: Secondary | ICD-10-CM | POA: Diagnosis not present

## 2021-03-08 MED ORDER — ACYCLOVIR 200 MG PO CAPS
200.0000 mg | ORAL_CAPSULE | Freq: Two times a day (BID) | ORAL | Status: DC
  Administered 2021-03-08 – 2021-03-12 (×9): 200 mg via ORAL
  Filled 2021-03-08 (×9): qty 1

## 2021-03-08 MED ORDER — BIOTENE DRY MOUTH MT LIQD
15.0000 mL | OROMUCOSAL | Status: DC
  Administered 2021-03-08 – 2021-03-12 (×23): 15 mL via OROMUCOSAL

## 2021-03-08 MED ORDER — PROCHLORPERAZINE EDISYLATE 10 MG/2ML IJ SOLN
10.0000 mg | Freq: Once | INTRAMUSCULAR | Status: AC
  Administered 2021-03-08: 10 mg via INTRAVENOUS
  Filled 2021-03-08: qty 2

## 2021-03-08 MED ORDER — SODIUM BICARBONATE/SODIUM CHLORIDE MOUTHWASH
OROMUCOSAL | Status: DC
  Administered 2021-03-12 (×2): 1 via OROMUCOSAL
  Filled 2021-03-08 (×2): qty 1000

## 2021-03-08 MED ORDER — ACETAMINOPHEN 325 MG PO TABS
650.0000 mg | ORAL_TABLET | Freq: Once | ORAL | Status: AC
  Administered 2021-03-08: 650 mg via ORAL
  Filled 2021-03-08: qty 2

## 2021-03-08 MED ORDER — SODIUM CHLORIDE 0.9 % IV SOLN
100.0000 mg/m2 | Freq: Once | INTRAVENOUS | Status: AC
  Administered 2021-03-08: 180 mg via INTRAVENOUS
  Filled 2021-03-08: qty 9

## 2021-03-08 MED ORDER — SODIUM CHLORIDE 0.9 % IV SOLN
375.0000 mg/m2 | Freq: Once | INTRAVENOUS | Status: AC
  Administered 2021-03-08: 700 mg via INTRAVENOUS
  Filled 2021-03-08: qty 50

## 2021-03-08 MED ORDER — DIPHENHYDRAMINE HCL 25 MG PO CAPS
50.0000 mg | ORAL_CAPSULE | Freq: Once | ORAL | Status: AC
  Administered 2021-03-08: 50 mg via ORAL
  Filled 2021-03-08: qty 2

## 2021-03-08 MED ORDER — CHLORHEXIDINE GLUCONATE CLOTH 2 % EX PADS
6.0000 | MEDICATED_PAD | Freq: Every day | CUTANEOUS | Status: DC
  Administered 2021-03-08 – 2021-03-10 (×3): 6 via TOPICAL

## 2021-03-08 NOTE — Progress Notes (Signed)
Ms. Celia is doing okay this morning.  Unfortunately, chemotherapy was not started yesterday.  Still have no clue as to why it was not started.  We will start today.  Apparently she has had no problem with headaches.  There is no cough or shortness of breath.  She has had no change in bowel or bladder habits.  There is been no bleeding.  She has had no fever.  Of note, the assisted living facility as she was and was started to have a COVID outbreak.  She has had no rashes.  There is no pain.  Her vital signs show temperature 98.4.  Pulse 85.  Blood pressure 116/62.  Her lungs are clear bilaterally.  Cardiac exam regular rate and rhythm.  Abdomen is soft.  She has good bowel sounds.  Extremities shows no clubbing, cyanosis or edema.  Neurological exam shows no focal neurological deficits.  Hopefully, we will be able to start treatment today.  She clearly is ready to start treatment.  At some point during this hospital stay, I will get a MRI of the brain to see how the CNS lesions of lymphoma are doing.  I know that she is going to get incredible care from all the staff up on [address].  I do appreciate all of their efforts.  [name], MD  Psalms 119:54

## 2021-03-08 NOTE — Progress Notes (Signed)
Calculation and dosing of Rituxan and Etoposide verified by Rick Duff

## 2021-03-08 NOTE — TOC Initial Note (Signed)
Transition of Care [facility]) - Initial/Assessment Note    Patient Details  Name: Ariel [name] MRN: [id number] Date of Birth: 11/02/1955  Transition [facility]) CM/SW Contact:    Lynnell Catalan, RN Phone Number: 03/08/2021, 12:50 PM     Transition of Care (TOC) Screening Note   Patient Details  Name: Ariel [name] Date of Birth: 1955/04/18   [facility]) CM/SW Contact:    Obert Espindola, Marjie Skiff, RN Phone Number: 03/08/2021, 12:50 PM    Transition of Care Department (TOC) has reviewed patient and no TOC needs have been identified at this time. We will continue to monitor patient advancement through interdisciplinary progression rounds. If new patient transition needs arise, please place a TOC consult.                          Activities of Daily Living Home Assistive Devices/Equipment: Walker (specify type), Cane (specify quad or straight), Grab bars around toilet, Grab bars in shower, Raised toilet seat with rails, Shower chair with back, Eyeglasses, Bedside commode/3-in-1 ADL Screening (condition at time of admission) Patient's cognitive ability adequate to safely complete daily activities?: Yes Is the patient deaf or have difficulty hearing?: No Does the patient have difficulty seeing, even when wearing glasses/contacts?: No Does the patient have difficulty concentrating, remembering, or making decisions?: No Patient able to express need for assistance with ADLs?: Yes Does the patient have difficulty dressing or bathing?: No Independently performs ADLs?: Yes (appropriate for developmental age) Does the patient have difficulty walking or climbing stairs?: No Weakness of Legs: None Weakness of Arms/Hands: None      Admission diagnosis:  Diffuse large B cell lymphoma ([place]) [C83.30] Patient Active Problem List   Diagnosis Date Noted   Diffuse large B cell lymphoma ([place]) 03/07/2021   Encounter for antineoplastic chemotherapy 03/07/2021   AMS (altered mental  status) 01/14/2021   Neutropenia ([place]) 01/14/2021   High grade B-cell lymphoma ([place]) 12/22/2020   Goals of care, counseling/discussion 41/66/0630   Acute metabolic encephalopathy 16/02/930   Brain metastasis ([place]) 12/09/2020   Brain tumor ([place]) 12/08/2020   Hyponatremia 12/08/2020   Night sweat 11/30/2020   Chicken pox 35/57/3220   Complication of anesthesia 11/22/2020   PONV (postoperative nausea and vomiting) 11/22/2020   Skin rash 11/10/2020   Tachycardia 11/10/2020   Anemia 11/10/2020   Hyperglycemia 11/10/2020   TIA (transient ischemic attack) 10/07/2020   Iron deficiency anemia 10/07/2020   Aphasia 10/06/2020   Melanoma ([place]) 2021   Multinodular goiter 12/22/2013   Subclinical hyperthyroidism 12/19/2013   Urinary incontinence 11/19/2013   Hyperlipidemia 11/19/2012   Routine general medical examination at a health care facility 11/18/2012   PCP:  [name], NP Pharmacy:   [facility], [place] [address] Phone: [phone] Fax: [phone]     Social Determinants of Health (SDOH) Interventions    Readmission Risk Interventions Readmission Risk Prevention Plan 03/08/2021  Transportation Screening Complete  PCP or Specialist Appt within 3-5 Days Complete  HRI or Home Care Consult Complete  Social Work Consult for [place] Planning/Counseling Complete  Palliative Care Screening Not Applicable  Medication Review Press photographer) Complete  Some recent data might be hidden

## 2021-03-08 NOTE — Plan of Care (Signed)
°  Problem: Clinical Measurements: Goal: Will remain free from infection Outcome: Progressing   Problem: Clinical Measurements: Goal: Ability to maintain clinical measurements within normal limits will improve Outcome: Progressing   

## 2021-03-08 NOTE — Progress Notes (Signed)
Patient was able to get admitted 03/07/2021 however chemo was unable to get started. She will start treatment today. Will follow for post discharge needs and follow up.   Oncology Nurse Navigator Documentation  Oncology Nurse Navigator Flowsheets 03/08/2021  Abnormal Finding Date -  Confirmed Diagnosis Date -  Phase of Treatment -  Chemotherapy Actual Start Date: -  Navigator Follow Up Date: 03/11/2021  Navigator Follow Up Reason: Appointment Review  Navigator Location [facility]  Navigator Encounter Type Appt/Treatment Plan Review  Telephone -  Treatment Initiated Date -  Patient Visit Type MedOnc  Treatment Phase Active Tx  Barriers/Navigation Needs Coordination of Care;Education  Education -  Interventions None Required  Acuity Level 2-Minimal Needs (1-2 Barriers Identified)  Referrals -  Coordination of Care -  Education Method -  Support Groups/Services Friends and Family  Time Spent with Patient 15

## 2021-03-09 DIAGNOSIS — C8332 Diffuse large B-cell lymphoma, intrathoracic lymph nodes: Secondary | ICD-10-CM | POA: Diagnosis not present

## 2021-03-09 LAB — COMPREHENSIVE METABOLIC PANEL
ALT: 20 U/L (ref 0–44)
AST: 20 U/L (ref 15–41)
Albumin: 3.7 g/dL (ref 3.5–5.0)
Alkaline Phosphatase: 79 U/L (ref 38–126)
Anion gap: 6 (ref 5–15)
BUN: 20 mg/dL (ref 8–23)
CO2: 26 mmol/L (ref 22–32)
Calcium: 9.2 mg/dL (ref 8.9–10.3)
Chloride: 106 mmol/L (ref 98–111)
Creatinine, Ser: 0.78 mg/dL (ref 0.44–1.00)
GFR, Estimated: >60 mL/min (ref >60)
Glucose, Bld: 122 mg/dL — ABNORMAL HIGH (ref 70–99)
Potassium: 4 mmol/L (ref 3.5–5.1)
Sodium: 138 mmol/L (ref 135–145)
Total Bilirubin: 0.7 mg/dL (ref 0.3–1.2)
Total Protein: 6.5 g/dL (ref 6.5–8.1)

## 2021-03-09 LAB — CBC WITH DIFFERENTIAL/PLATELET
Abs Immature Granulocytes: 0.01 10*3/uL (ref 0.00–0.07)
Basophils Absolute: 0 10*3/uL (ref 0.0–0.1)
Basophils Relative: 0 %
Eosinophils Absolute: 0.1 10*3/uL (ref 0.0–0.5)
Eosinophils Relative: 2 %
HCT: 31.9 % — ABNORMAL LOW (ref 36.0–46.0)
Hemoglobin: 10.6 g/dL — ABNORMAL LOW (ref 12.0–15.0)
Immature Granulocytes: 0 %
Lymphocytes Relative: 26 %
Lymphs Abs: 1.5 10*3/uL (ref 0.7–4.0)
MCH: 29.7 pg (ref 26.0–34.0)
MCHC: 33.2 g/dL (ref 30.0–36.0)
MCV: 89.4 fL (ref 80.0–100.0)
Monocytes Absolute: 0.5 10*3/uL (ref 0.1–1.0)
Monocytes Relative: 9 %
Neutro Abs: 3.8 10*3/uL (ref 1.7–7.7)
Neutrophils Relative %: 63 %
Platelets: 221 10*3/uL (ref 150–400)
RBC: 3.57 MIL/uL — ABNORMAL LOW (ref 3.87–5.11)
RDW: 18 % — ABNORMAL HIGH (ref 11.5–15.5)
WBC: 6 10*3/uL (ref 4.0–10.5)
nRBC: 0 % (ref 0.0–0.2)

## 2021-03-09 MED ORDER — DOCUSATE SODIUM 100 MG PO CAPS: 100.0000 mg | ORAL_CAPSULE | Freq: Two times a day (BID) | ORAL | Status: DC | PRN

## 2021-03-09 MED ORDER — SODIUM CHLORIDE 0.9 % IV SOLN
Freq: Once | INTRAVENOUS | Status: AC
  Filled 2021-03-09: qty 5

## 2021-03-09 MED ORDER — PALONOSETRON HCL INJECTION 0.25 MG/5ML
0.2500 mg | Freq: Once | INTRAVENOUS | Status: AC
  Administered 2021-03-09: 0.25 mg via INTRAVENOUS
  Filled 2021-03-09: qty 5

## 2021-03-09 MED ORDER — SODIUM CHLORIDE 0.9 % IV SOLN
100.0000 mg/m2 | Freq: Once | INTRAVENOUS | Status: AC
  Administered 2021-03-09: 180 mg via INTRAVENOUS
  Filled 2021-03-09: qty 9

## 2021-03-09 MED ORDER — SODIUM CHLORIDE 0.9 % IV SOLN
416.5000 mg | Freq: Once | INTRAVENOUS | Status: AC
  Administered 2021-03-09: 420 mg via INTRAVENOUS
  Filled 2021-03-09: qty 42

## 2021-03-09 MED ORDER — SODIUM CHLORIDE 0.9 % IV SOLN
Freq: Once | INTRAVENOUS | Status: AC
  Filled 2021-03-09: qty 140

## 2021-03-09 NOTE — Progress Notes (Addendum)
Chemotherapy Day 2:  Aloxi and Emend given for premeds.   Etoposide IV administered: 180 mg over 60 min. Carboplatin IV administered: 420 mg over 30 min. Ifex IV administered:  7,000 mg over 24 hours.  Pt stable.  VS stable.  Port a cath used with positive blood return in between medications. Second verification with Aldean Baker, RN.

## 2021-03-09 NOTE — Progress Notes (Signed)
So far, Ariel [name] is doing well.  She has had no problems with chemotherapy.  She has had no nausea or vomiting.  She has had no change in her overall mental status.  She is still quite alert.  There is been no issues with going to the bathroom.  She has had no diarrhea.  There has been no bleeding.  She has had no fever.  There is no cough or shortness of breath.  She has had no leg swelling.  There has been no rashes.  She has had no issues with hematuria.  Will not check labs yet today.  I will check lab work on her tomorrow.  Her vital signs are temperature 97.5.  Pulse 90.  Blood pressure 116/52.  Her oxygen saturation is 97%.  Oral exam shows no mucositis.  Lungs are clear.  Cardiac exam regular rate and rhythm.  Abdomen is soft.  Bowel sounds are present.  Extremity shows no clubbing, cyanosis or edema.  Neurological exam shows no focal neurological deficits.  Ariel [name] is getting chemotherapy for her diffuse large cell lymphoma.  I have her on a "hybrid" protocol where she gets 1 cycle of R-ICE and then alternate with R-MTV.  So far, this is worked very nicely for her.  While she is in the hospital, I will get an MRI of the brain to see how the lymphoma has responded.  I know that the staff on [address]. have done a fantastic job with her.  I am very grateful for this.  [name], MD  Renee Rival 2:14

## 2021-03-10 ENCOUNTER — Encounter: Payer: Self-pay | Admitting: Hematology & Oncology

## 2021-03-10 DIAGNOSIS — C8333 Diffuse large B-cell lymphoma, intra-abdominal lymph nodes: Secondary | ICD-10-CM | POA: Diagnosis not present

## 2021-03-10 MED ORDER — OLANZAPINE 5 MG PO TABS
10.0000 mg | ORAL_TABLET | Freq: Every day | ORAL | Status: DC
  Administered 2021-03-10: 10 mg via ORAL
  Filled 2021-03-10 (×3): qty 2

## 2021-03-10 MED ORDER — SODIUM CHLORIDE 0.9 % IV SOLN
100.0000 mg/m2 | Freq: Once | INTRAVENOUS | Status: AC
  Administered 2021-03-10: 180 mg via INTRAVENOUS
  Filled 2021-03-10: qty 9

## 2021-03-10 MED ORDER — PROCHLORPERAZINE MALEATE 10 MG PO TABS
10.0000 mg | ORAL_TABLET | Freq: Once | ORAL | Status: AC
  Administered 2021-03-10: 10 mg via ORAL
  Filled 2021-03-10: qty 1

## 2021-03-10 MED ORDER — DOCUSATE SODIUM 100 MG PO CAPS
100.0000 mg | ORAL_CAPSULE | Freq: Every day | ORAL | Status: DC
  Administered 2021-03-10 – 2021-03-11 (×3): 100 mg via ORAL
  Filled 2021-03-10 (×2): qty 1

## 2021-03-10 NOTE — Progress Notes (Signed)
Chemotherapy Day 3  Ifex finished with  no complications at 7159.Marland Kitchen  Compazine po given for pre antiemetics.  Etoposide verified with Gerlene Fee, RN.    Administered Etoposide180 mg IV via Port with positive blood return prior.  Administered over 60 minutes.

## 2021-03-10 NOTE — Progress Notes (Signed)
So far, there has been no problems with chemotherapy.  She has had 2 days of chemotherapy.  The ifosfamide is currently infusing in.  She is walking.  She is having no problems with nausea or vomiting.  There may have been a little bit of nausea yesterday.  We will have to see about getting her on a standing dose of antiemetics.  She has had no diarrhea.  She is a little bit of constipation.  I think we gave her some stool softener yesterday.  She has had no issues with fever.  There is no cough.  She has had no headache.  There is been no leg swelling.  Her labs show white cell count of 6.  Hemoglobin 10.6.  Platelet count is 2 and 21,000.  Sodium is 138.  Potassium 4.0.  BUN 20 creatinine 0.78.  Calcium 9.2.  LFTs are normal.  Her appetite is she doing pretty well.  Her vital signs are temperature of 98.  Pulse 86.  Blood pressure 118/56.  Oral exam does not show any mucositis.  Lungs are clear.  Cardiac exam regular rate and rhythm.  Abdomen is soft.  Bowel sounds are present.  There is no fluid wave.  Extremity shows no clubbing, cyanosis or edema.  Neurological exam is nonfocal.  Ariel [name] will have thing day 3 of treatment today.  So far, she is doing well.  We are holding steroids so that she does not have the mental status changes.  I will probably plan for an MRI of the brain tomorrow or Saturday.  I do appreciate the outstanding care she is getting from the wonderful staff up on [address].  All the staff are doing a tremendous job.  [name], MD  Colossians 3:15

## 2021-03-10 NOTE — Care Management Important Message (Signed)
Important Message  Patient Details IM Letter given to the Patient. Name: Ariel [name] MRN: [id number] Date of Birth: 03/06/1955   Medicare Important Message Given:  Yes     [name] 03/10/2021, 11:03 AM

## 2021-03-11 ENCOUNTER — Inpatient Hospital Stay (HOSPITAL_COMMUNITY): Payer: Medicare Other

## 2021-03-11 ENCOUNTER — Encounter: Payer: Self-pay | Admitting: *Deleted

## 2021-03-11 ENCOUNTER — Telehealth: Payer: Self-pay | Admitting: *Deleted

## 2021-03-11 DIAGNOSIS — C8332 Diffuse large B-cell lymphoma, intrathoracic lymph nodes: Secondary | ICD-10-CM | POA: Diagnosis not present

## 2021-03-11 IMAGING — MR MR HEAD WO/W CM
13 series · 48 of 48 positions shown · IV contrast (gadavist)
Comparison: [DATE]

CLINICAL DATA: Hematologic malignancy, assess treatment response;
status post chemotherapy for CNS lymphoma

EXAM:
MRI HEAD WITHOUT AND WITH CONTRAST
TECHNIQUE: Multiplanar, multiecho pulse sequences of the brain and surrounding
structures were obtained without and with intravenous contrast.
CONTRAST:  7mL GADAVIST GADOBUTROL 1 MMOL/ML IV SOLN

[Series 5: DWI · axial · 3.0mm · 1.36mm/px · z∈[-25,+116]mm · 7 of 96 slices shown (1 of 2)]
[im 1/96]
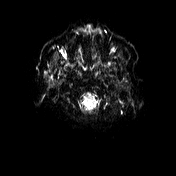
[im 16/96]
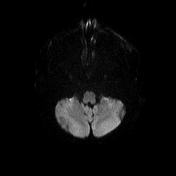
[im 32/96]
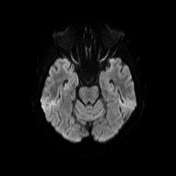
[im 48/96]
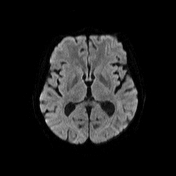
[im 64/96]
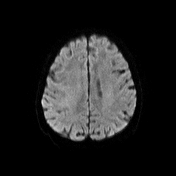
[im 80/96]
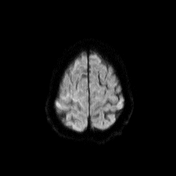
[im 96/96]
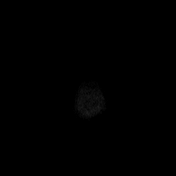

[Series 6: DWI · axial · 3.0mm · 1.36mm/px · z∈[-25,+116]mm · 3 of 48 slices shown (2 of 2)]
[im 1/48]
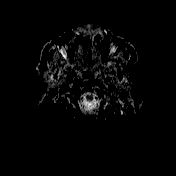
[im 24/48]
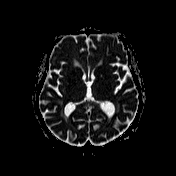
[im 48/48]
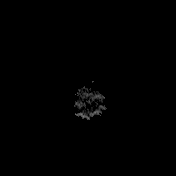

[Series 7: T1 · sagittal · 5.0mm · 0.75mm/px · 1 of 24 slices shown (1 of 2)]
[im 1/24]
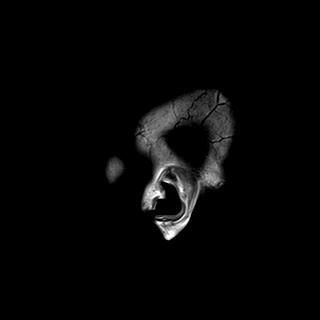

[Series 8: T2 · axial · 5.0mm · 0.62mm/px · 1 of 24 slices shown]
[im 1/24]
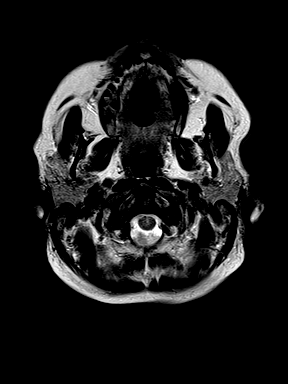

[Series 9: swi_images · axial · 3.0mm · 0.75mm/px · z∈[-32,+121]mm · 3 of 52 slices shown]
[im 1/52]
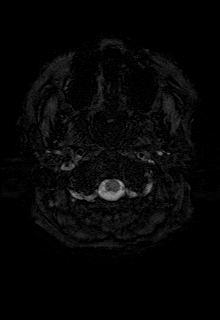
[im 26/52]
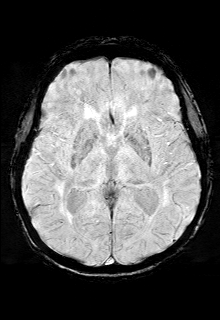
[im 52/52]
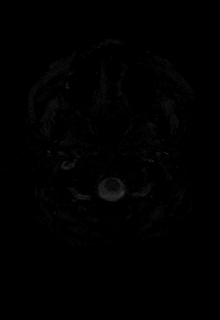

[Series 11: FLAIR · axial · 3.0mm · 0.75mm/px · z∈[-32,+121]mm · 3 of 52 slices shown]
[im 1/52]
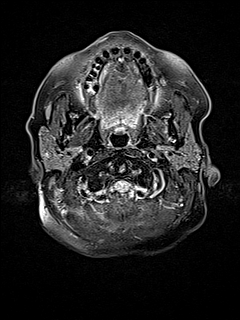
[im 26/52]
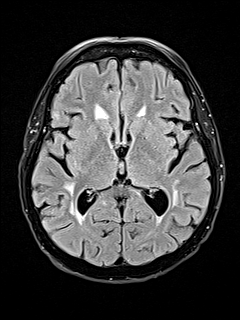
[im 52/52]
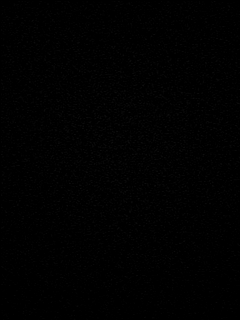

[Series 12: T1 · axial · 1.0mm · 0.94mm/px · z∈[-35,+124]mm · 10 of 160 slices shown (2 of 2)]
[im 1/160]
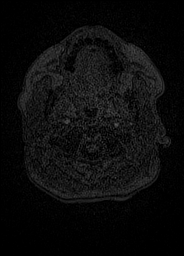
[im 18/160]
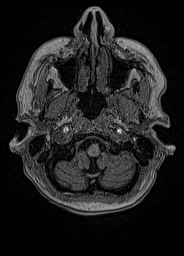
[im 36/160]
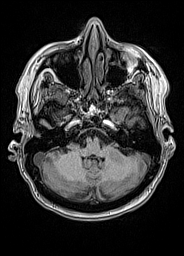
[im 54/160]
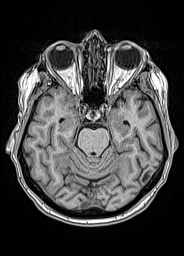
[im 71/160]
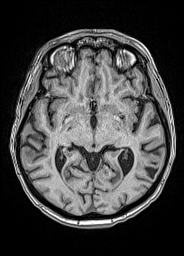
[im 89/160]
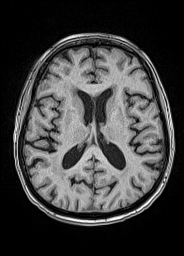
[im 107/160]
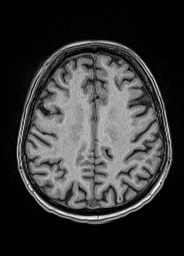
[im 124/160]
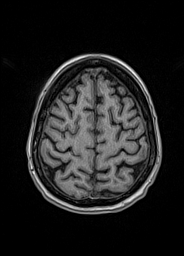
[im 142/160]
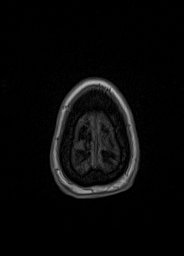
[im 160/160]
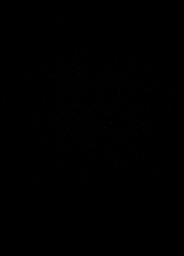

[Series 13: cor dwi_tracew · coronal · 5.0mm · 1.53mm/px · 3 of 54 slices shown]
[im 1/54]
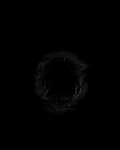
[im 27/54]
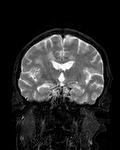
[im 54/54]
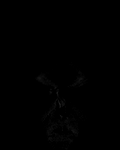

[Series 14: cor dwi_adc · coronal · 5.0mm · 1.53mm/px · 2 of 27 slices shown]
[im 1/27]
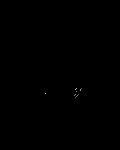
[im 27/27]
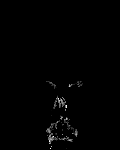

[Series 15: T2 post-contrast · coronal · 5.0mm · 0.57mm/px · 2 of 28 slices shown]
[im 1/28]
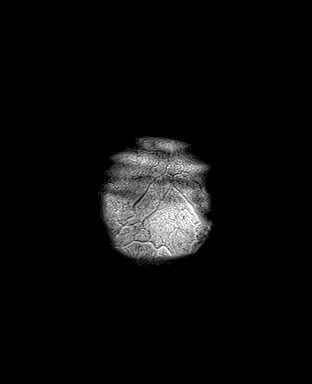
[im 28/28]
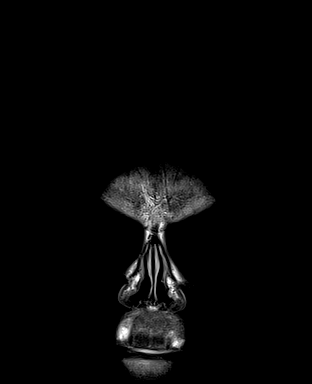

[Series 16: T1 post-contrast · axial · 1.0mm · 0.94mm/px · z∈[-35,+124]mm · 10 of 160 slices shown (1 of 3)]
[im 1/160]
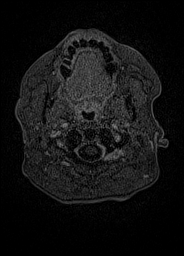
[im 18/160]
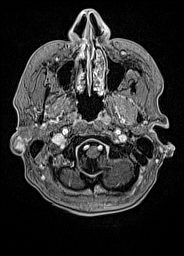
[im 36/160]
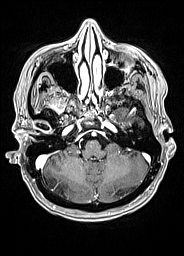
[im 54/160]
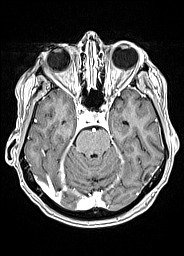
[im 71/160]
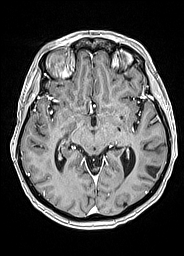
[im 89/160]
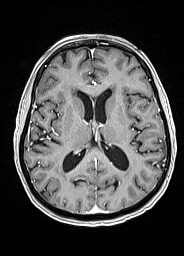
[im 107/160]
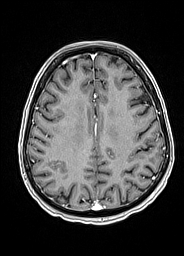
[im 124/160]
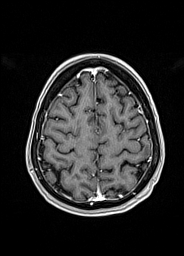
[im 142/160]
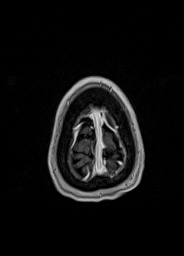
[im 160/160]
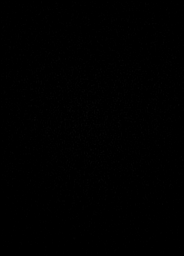

[Series 17: T1 post-contrast · coronal · 5.0mm · 0.43mm/px · 2 of 28 slices shown (2 of 3)]
[im 1/28]
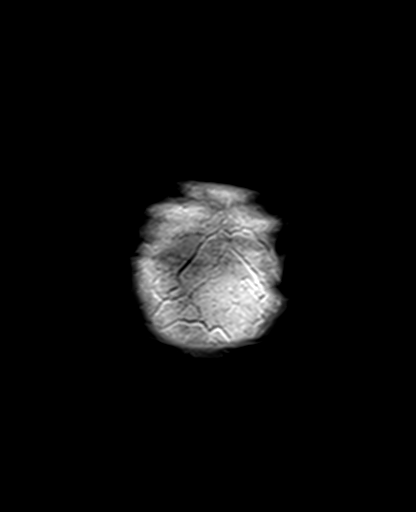
[im 28/28]
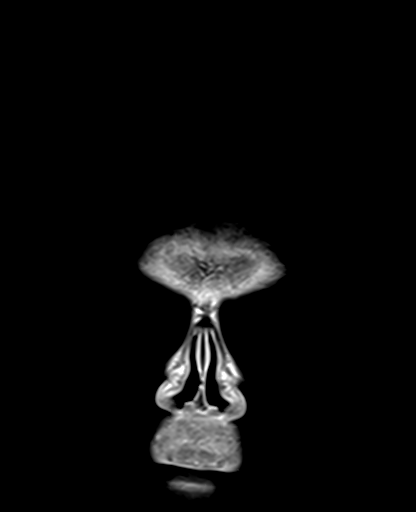

[Series 18: T1 post-contrast · sagittal · 5.0mm · 0.75mm/px · 1 of 24 slices shown (3 of 3)]
[im 1/24]
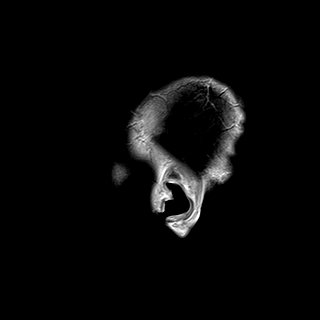

[48 of 48 positions shown; findings below may reference images not displayed]

FINDINGS: Brain: Left posterior temporal lesion is again identified with
unchanged curvilinear enhancement. Adjacent T2 FLAIR hyperintensity
is mildly decreased. As before, there is some associated
susceptibility. Small left posterior parietal lesion remains
nonenhancing and has decreased in size (series 11, image 36).

There is a new 3 mm enhancing lesion of the left postcentral gyrus.
Mild surrounding edema. Corresponding facilitated diffusion.

There is new T2 FLAIR hyperintensity involving the left brachium
pontis without enhancement. Appears to be corresponding facilitated
diffusion on ADC.

New T2 FLAIR hyperintensity involving the right precentral gyrus
including cortex (series 11, image 44). As above, there is no
enhancement and some facilitated diffusion on ADC

There is no acute infarction. Stable distal foci of T2
hyperintensity in the supratentorial white matter that may reflect
chronic microvascular ischemic changes. Unchanged few scattered foci
of susceptibility likely reflecting chronic blood products.

Vascular: Major vessel flow voids at the skull base are preserved.

Skull and upper cervical spine: Normal marrow signal is preserved.

Sinuses/Orbits: Paranasal sinuses are aerated. Orbits are
unremarkable.

Other: Sella is unremarkable.  Mastoid air cells are clear.
IMPRESSION: New 3 mm enhancing lesion of the left postcentral gyrus with mild
edema. New nonenhancing T2 FLAIR hyperintensity in the left brachium
pontis and right precentral gyrus. Short-term follow-up is
recommended.

Stable appearance of left temporal lesion with mild decrease in
surrounding edema. Decrease in size of nonenhancing left posterior
parietal lesion.

## 2021-03-11 MED ORDER — GADOBUTROL 1 MMOL/ML IV SOLN
7.0000 mL | Freq: Once | INTRAVENOUS | Status: AC | PRN
  Administered 2021-03-11: 7 mL via INTRAVENOUS

## 2021-03-11 NOTE — Progress Notes (Signed)
So far, Ariel [name] is doing very nicely.  I think she has completed her chemotherapy.  We will get an MRI of the brain today and see how everything looks.  There is no nausea or vomiting.  She has had no problems with fever.  She has had no cough or shortness of breath.  There is no issues with bowels or bladder.  She is out of bed.  She is walking.  She really feels well.  She looks good.  She sounds good.  There is no change in mental state.  Her vital signs are temperature 98.2.  Pulse 85.  Blood pressure 113/95.  Oral exam does not show any mucositis.  Lungs are clear bilaterally.  Cardiac exam regular rate and rhythm.  Abdomen is soft.  Bowel sounds are present.  Extremity shows no clubbing, cyanosis or edema.  Neurological exam shows no focal neurological deficits.  Ariel [name] has non-Hodgkin's lymphoma.  She has had a response.  This is her second cycle of R-ICE.  She tolerated this well.  We will get the MRI of the brain today.  I will check her labs tomorrow.  Hopefully, we will be able to let her go home tomorrow.  She will get Neulasta in the office as an outpatient.  I appreciate the incredible care that she is received from all the staff up on [address].  [name], MD  Psalms 121:7

## 2021-03-11 NOTE — Telephone Encounter (Signed)
Per scheduling message Ariel [name] - gave upcoming appointments - confirmed

## 2021-03-11 NOTE — Progress Notes (Signed)
Reviewed inpatient notes and Dr Marin Olp would like patient to come in for Neulasta on Monday. Scheduled patient for injection. He would also like to follow up with patient in about 2 weeks. Message sent to schedulers.   Called and spoke to patient. She is aware of injection appointment on Monday including time and location.   Oncology Nurse Navigator Documentation  Oncology Nurse Navigator Flowsheets 03/11/2021  Abnormal Finding Date -  Confirmed Diagnosis Date -  Phase of Treatment -  Chemotherapy Actual Start Date: -  Navigator Follow Up Date: 03/15/2021  Navigator Follow Up Reason: Appointment Review  Navigator Location [facility]  Navigator Encounter Type Telephone;Appt/Treatment Plan Review  Telephone -  Treatment Initiated Date -  Patient Visit Type MedOnc  Treatment Phase Active Tx  Barriers/Navigation Needs Coordination of Care;Education  Education Other  Interventions Coordination of Care;Education  Acuity Level 2-Minimal Needs (1-2 Barriers Identified)  Referrals -  Coordination of Care Appts  Education Method Verbal  Support Groups/Services Friends and Family  Time Spent with Patient 30

## 2021-03-11 NOTE — Progress Notes (Signed)
Okay to give Ziextenzo > 72 hours after chemotherapy. No insurance reimbursement issue per Gaspar Bidding, Patient Advocate.

## 2021-03-12 DIAGNOSIS — C8332 Diffuse large B-cell lymphoma, intrathoracic lymph nodes: Secondary | ICD-10-CM | POA: Diagnosis not present

## 2021-03-12 LAB — COMPREHENSIVE METABOLIC PANEL
ALT: 72 U/L — ABNORMAL HIGH (ref 0–44)
AST: 70 U/L — ABNORMAL HIGH (ref 15–41)
Albumin: 3.5 g/dL (ref 3.5–5.0)
Alkaline Phosphatase: 72 U/L (ref 38–126)
Anion gap: 5 (ref 5–15)
BUN: 20 mg/dL (ref 8–23)
CO2: 26 mmol/L (ref 22–32)
Calcium: 9.8 mg/dL (ref 8.9–10.3)
Chloride: 108 mmol/L (ref 98–111)
Creatinine, Ser: 0.8 mg/dL (ref 0.44–1.00)
GFR, Estimated: >60 mL/min (ref >60)
Glucose, Bld: 176 mg/dL — ABNORMAL HIGH (ref 70–99)
Potassium: 3.3 mmol/L — ABNORMAL LOW (ref 3.5–5.1)
Sodium: 139 mmol/L (ref 135–145)
Total Bilirubin: 0.6 mg/dL (ref 0.3–1.2)
Total Protein: 6.2 g/dL — ABNORMAL LOW (ref 6.5–8.1)

## 2021-03-12 LAB — CBC WITH DIFFERENTIAL/PLATELET
Abs Immature Granulocytes: 0 10*3/uL (ref 0.00–0.07)
Basophils Absolute: 0 10*3/uL (ref 0.0–0.1)
Basophils Relative: 0 %
Eosinophils Absolute: 0.2 10*3/uL (ref 0.0–0.5)
Eosinophils Relative: 5 %
HCT: 30.4 % — ABNORMAL LOW (ref 36.0–46.0)
Hemoglobin: 10.1 g/dL — ABNORMAL LOW (ref 12.0–15.0)
Immature Granulocytes: 0 %
Lymphocytes Relative: 22 %
Lymphs Abs: 0.7 10*3/uL (ref 0.7–4.0)
MCH: 30.7 pg (ref 26.0–34.0)
MCHC: 33.2 g/dL (ref 30.0–36.0)
MCV: 92.4 fL (ref 80.0–100.0)
Monocytes Absolute: 0.1 10*3/uL (ref 0.1–1.0)
Monocytes Relative: 2 %
Neutro Abs: 2.3 10*3/uL (ref 1.7–7.7)
Neutrophils Relative %: 71 %
Platelets: 209 10*3/uL (ref 150–400)
RBC: 3.29 MIL/uL — ABNORMAL LOW (ref 3.87–5.11)
RDW: 16.6 % — ABNORMAL HIGH (ref 11.5–15.5)
WBC: 3.3 10*3/uL — ABNORMAL LOW (ref 4.0–10.5)
nRBC: 0 % (ref 0.0–0.2)

## 2021-03-12 LAB — ERYTHROPOIETIN: Erythropoietin: 23.8 m[IU]/mL — ABNORMAL HIGH (ref 2.6–18.5)

## 2021-03-12 LAB — LACTATE DEHYDROGENASE: LDH: 182 U/L (ref 98–192)

## 2021-03-12 MED ORDER — SODIUM CHLORIDE 0.9 % IV SOLN
150.0000 mg | Freq: Once | INTRAVENOUS | Status: AC
  Administered 2021-03-12: 150 mg via INTRAVENOUS
  Filled 2021-03-12: qty 5

## 2021-03-12 MED ORDER — SODIUM BICARBONATE/SODIUM CHLORIDE MOUTHWASH: 30.0000 "application " | OROMUCOSAL | 3 refills | Status: DC

## 2021-03-12 MED ORDER — BIOTENE DRY MOUTH MT LIQD: 15.0000 mL | OROMUCOSAL | 4 refills | Status: DC

## 2021-03-12 MED ORDER — CIPROFLOXACIN HCL 500 MG PO TABS
500.0000 mg | ORAL_TABLET | Freq: Every day | ORAL | Status: DC
  Administered 2021-03-12: 500 mg via ORAL
  Filled 2021-03-12: qty 1

## 2021-03-12 MED ORDER — FLUCONAZOLE 100 MG PO TABS: 100.0000 mg | ORAL_TABLET | Freq: Every day | ORAL | Status: DC

## 2021-03-12 MED ORDER — FLUCONAZOLE 100 MG PO TABS: 100.0000 mg | ORAL_TABLET | Freq: Every day | ORAL | 3 refills | Status: DC

## 2021-03-12 MED ORDER — CIPROFLOXACIN HCL 500 MG PO TABS: 500.0000 mg | ORAL_TABLET | Freq: Every day | ORAL | 3 refills | Status: DC

## 2021-03-12 MED ORDER — HEPARIN SOD (PORK) LOCK FLUSH 100 UNIT/ML IV SOLN
500.0000 [IU] | INTRAVENOUS | Status: DC | PRN
  Filled 2021-03-12: qty 5

## 2021-03-12 MED ORDER — OLANZAPINE 10 MG PO TABS: 10.0000 mg | ORAL_TABLET | Freq: Every day | ORAL | 2 refills | Status: DC

## 2021-03-12 MED ORDER — SODIUM CHLORIDE 0.9 % IV SOLN
16.0000 mg | Freq: Once | INTRAVENOUS | Status: AC
  Administered 2021-03-12: 16 mg via INTRAVENOUS
  Filled 2021-03-12: qty 8

## 2021-03-12 NOTE — Discharge Summary (Signed)
Physician Discharge Summary  Patient ID: Ariel [name] @ATTENDINGNPI @ MRN: [id number] DOB/AGE: [birth year] 66 y.o.  Admit date: 03/07/2021 Discharge date: 03/12/2021  Admission Diagnoses: Diffuse large cell non-Hodgkin's lymphoma-admitted for cycle #2 of R-ICE  Discharge Diagnoses:  Principal Problem:   Diffuse large B cell lymphoma ([place]) Active Problems:   Encounter for antineoplastic chemotherapy   Discharged Condition: Good  Discharge Labs:   Significant Diagnostic Studies: radiology:  MRI of the brain done  Consults: None  Disposition:  There are no questions and answers to display.        Treatments: Chemotherapy  Allergies as of 03/12/2021       Reactions   Codeine Nausea Only   Decadron [dexamethasone]    Plavix [clopidogrel]    Hives,lip swelling   Prednisone    Steroids caused psychological issues         Medication List     STOP taking these medications    OLANZapine zydis 5 MG disintegrating tablet Commonly known as: ZYPREXA Replaced by: OLANZapine 10 MG tablet       TAKE these medications    acyclovir 200 MG capsule Commonly known as: ZOVIRAX Take 1 capsule (200 mg total) by mouth 2 (two) times daily.   antiseptic oral rinse Liqd 15 mLs by Mouth Rinse route every 4 (four) hours.   ciprofloxacin 500 MG tablet Commonly known as: CIPRO Take 1 tablet (500 mg total) by mouth daily with breakfast.   fluconazole 100 MG tablet Commonly known as: DIFLUCAN Take 1 tablet (100 mg total) by mouth daily.   lidocaine-prilocaine cream Commonly known as: EMLA Apply to affected area once   OLANZapine 10 MG tablet Commonly known as: ZYPREXA Take 1 tablet (10 mg total) by mouth daily. Replaces: OLANZapine zydis 5 MG disintegrating tablet   sodium bicarbonate/sodium chloride Soln 30 application by Mouth Rinse route every 4 (four) hours.         Follow-up Information     Volanda Napoleon, MD Follow up in 2 day(s).   Specialty:  Oncology Why: Please come on Monday the 16th for labs and Neulasta injection Contact information: [place] [address] [phone]                 Hospital Course: Ariel [name] was admitted on 03/07/2021.  She came in for her second cycle of chemotherapy with R-ICE.  She had her Port-A-Cath accessed.  She started chemotherapy on the 10th.  She tolerated this quite well.  She had new Decadron given as this was causing issues with her mental state.  She had a little bit of oral discomfort.  We had her on mouth rinses.  She had no diarrhea.  She had no cough or shortness of breath.  There is no headache.  We monitored her labs.  On the day of discharge, her white count was 3.3.  Hemoglobin 10.4.  Platelet count 209,000.  Her sodium is 139.  Potassium 3.3.  BUN 20 creatinine 0.8.  We did do an MRI of the brain while she is in the hospital.  This was done on 03/11/2021.  The MRI of the brain showed a new 3 mm enhancing lesion in the left postcentral gyrus.  Again I am not sure exactly what this signifies.  She has stable appearance of the left temporal lesion with mild decrease in edema.  There is decrease in a left posterior parietal lesion.  Para she is having no neurological issues at all.  I feel that she could be discharged on the 14th.  We are having her go to an assisted living facility.     Discharge Exam: Blood pressure 136/64, pulse 81, temperature 98.1 F (36.7 C), temperature source Oral, resp. rate 18, height 5\' 6"  (1.676 m), weight 145 lb 11.2 oz (66.1 kg), SpO2 100 %. Her physical exam upon discharge showed a well-developed and well-nourished white female.  Her head neck exam shows no ocular or oral lesions.  There is no mucositis.  She has no adenopathy in the neck.  Lungs are clear bilaterally.  Cardiac exam regular rate and rhythm with no murmurs, rubs or bruits.  Abdomen is soft.  She has good bowel sounds.  There is no fluid wave.  There is  no palpable liver or spleen tip.  Extremity shows no clubbing, cyanosis or edema.  She has good range of motion of her joints.  Neurological exam shows no focal neurological deficits.  She is intact mentally.  She is at her baseline mentally.  Skin exam shows no rashes, ecchymoses or petechiae.    SignedVolanda Napoleon 03/12/2021, 7:54 AM

## 2021-03-12 NOTE — Plan of Care (Signed)
Problem: Clinical Measurements: Goal: Ability to maintain clinical measurements within normal limits will improve Outcome: Completed/Met Goal: Will remain free from infection Outcome: Completed/Met Goal: Diagnostic test results will improve Outcome: Completed/Met Goal: Respiratory complications will improve Outcome: Completed/Met Goal: Cardiovascular complication will be avoided Outcome: Completed/Met   Problem: Activity: Goal: Risk for activity intolerance will decrease Outcome: Completed/Met   Problem: Education: Goal: Knowledge of the prescribed therapeutic regimen will improve Outcome: Completed/Met   Problem: Activity: Goal: Ability to implement measures to reduce episodes of fatigue will improve Outcome: Completed/Met   Problem: Bowel/Gastric: Goal: Will not experience complications related to bowel motility Outcome: Completed/Met   Problem: Coping: Goal: Ability to identify and develop effective coping behavior will improve Outcome: Completed/Met   Problem: Nutritional: Goal: Maintenance of adequate nutrition will improve Outcome: Completed/Met

## 2021-03-12 NOTE — Progress Notes (Signed)
Pt discharged home in stable condition. Discharge instructions and scripts given. Pt verbalized understanding. No immediate questions or concerns at this time. Discharged from unit via wheelchair  

## 2021-03-14 ENCOUNTER — Inpatient Hospital Stay: Payer: Medicare Other

## 2021-03-14 ENCOUNTER — Encounter: Payer: Self-pay | Admitting: *Deleted

## 2021-03-14 ENCOUNTER — Other Ambulatory Visit: Payer: Self-pay

## 2021-03-14 ENCOUNTER — Inpatient Hospital Stay: Payer: Medicare Other | Attending: Internal Medicine

## 2021-03-14 VITALS — BP 144/80 | HR 78 | Temp 98.0°F | Resp 17

## 2021-03-14 DIAGNOSIS — Z79899 Other long term (current) drug therapy: Secondary | ICD-10-CM | POA: Diagnosis not present

## 2021-03-14 DIAGNOSIS — C8510 Unspecified B-cell lymphoma, unspecified site: Secondary | ICD-10-CM

## 2021-03-14 DIAGNOSIS — C8339 Diffuse large B-cell lymphoma, extranodal and solid organ sites: Secondary | ICD-10-CM | POA: Insufficient documentation

## 2021-03-14 LAB — CMP (CANCER CENTER ONLY)
ALT: 140 U/L — ABNORMAL HIGH (ref 0–44)
AST: 87 U/L — ABNORMAL HIGH (ref 15–41)
Albumin: 4.6 g/dL (ref 3.5–5.0)
Alkaline Phosphatase: 92 U/L (ref 38–126)
Anion gap: 9 (ref 5–15)
BUN: 26 mg/dL — ABNORMAL HIGH (ref 8–23)
CO2: 27 mmol/L (ref 22–32)
Calcium: 10.3 mg/dL (ref 8.9–10.3)
Chloride: 100 mmol/L (ref 98–111)
Creatinine: 1.08 mg/dL — ABNORMAL HIGH (ref 0.44–1.00)
GFR, Estimated: 57 mL/min — ABNORMAL LOW (ref >60)
Glucose, Bld: 123 mg/dL — ABNORMAL HIGH (ref 70–99)
Potassium: 3.9 mmol/L (ref 3.5–5.1)
Sodium: 136 mmol/L (ref 135–145)
Total Bilirubin: 0.4 mg/dL (ref 0.3–1.2)
Total Protein: 7.2 g/dL (ref 6.5–8.1)

## 2021-03-14 LAB — CBC WITH DIFFERENTIAL (CANCER CENTER ONLY)
Abs Immature Granulocytes: 0.02 10*3/uL (ref 0.00–0.07)
Basophils Absolute: 0 10*3/uL (ref 0.0–0.1)
Basophils Relative: 1 %
Eosinophils Absolute: 0.2 10*3/uL (ref 0.0–0.5)
Eosinophils Relative: 4 %
HCT: 30.2 % — ABNORMAL LOW (ref 36.0–46.0)
Hemoglobin: 10.6 g/dL — ABNORMAL LOW (ref 12.0–15.0)
Immature Granulocytes: 1 %
Lymphocytes Relative: 22 %
Lymphs Abs: 0.9 10*3/uL (ref 0.7–4.0)
MCH: 31.1 pg (ref 26.0–34.0)
MCHC: 35.1 g/dL (ref 30.0–36.0)
MCV: 88.6 fL (ref 80.0–100.0)
Monocytes Absolute: 0.1 10*3/uL (ref 0.1–1.0)
Monocytes Relative: 2 %
Neutro Abs: 2.9 10*3/uL (ref 1.7–7.7)
Neutrophils Relative %: 70 %
Platelet Count: 222 10*3/uL (ref 150–400)
RBC: 3.41 MIL/uL — ABNORMAL LOW (ref 3.87–5.11)
RDW: 15.3 % (ref 11.5–15.5)
WBC Count: 4.1 10*3/uL (ref 4.0–10.5)
nRBC: 0 % (ref 0.0–0.2)

## 2021-03-14 MED ORDER — PEGFILGRASTIM-BMEZ 6 MG/0.6ML ~~LOC~~ SOSY
6.0000 mg | PREFILLED_SYRINGE | Freq: Once | SUBCUTANEOUS | Status: AC
  Administered 2021-03-14: 6 mg via SUBCUTANEOUS
  Filled 2021-03-14: qty 0.6

## 2021-03-14 NOTE — Patient Instructions (Signed)

## 2021-03-14 NOTE — Progress Notes (Signed)
Patient is doing well after her last cycle of chemotherapy. Her only complaint today is a metallic taste in her mouth. She knows to call the office with any questions or concerns. She is also aware of her next appointment.   Oncology Nurse Navigator Documentation  Oncology Nurse Navigator Flowsheets 03/14/2021  Abnormal Finding Date -  Confirmed Diagnosis Date -  Phase of Treatment -  Chemotherapy Actual Start Date: -  Navigator Follow Up Date: 03/29/2021  Navigator Follow Up Reason: Follow-up Appointment  Navigator Location [facility]  Navigator Encounter Type Treatment  Telephone -  Treatment Initiated Date -  Patient Visit Type MedOnc  Treatment Phase Active Tx  Barriers/Navigation Needs Coordination of Care;Education  Education -  Interventions Psycho-Social Support  Acuity Level 2-Minimal Needs (1-2 Barriers Identified)  Referrals -  Coordination of Care -  Education Method -  Support Groups/Services Friends and Family  Time Spent with Patient 15

## 2021-03-29 ENCOUNTER — Telehealth: Payer: Self-pay

## 2021-03-29 ENCOUNTER — Inpatient Hospital Stay (HOSPITAL_BASED_OUTPATIENT_CLINIC_OR_DEPARTMENT_OTHER): Payer: Medicare Other | Admitting: Hematology & Oncology

## 2021-03-29 ENCOUNTER — Encounter: Payer: Self-pay | Admitting: *Deleted

## 2021-03-29 ENCOUNTER — Other Ambulatory Visit: Payer: Self-pay

## 2021-03-29 ENCOUNTER — Inpatient Hospital Stay: Payer: Medicare Other

## 2021-03-29 ENCOUNTER — Other Ambulatory Visit: Payer: Self-pay | Admitting: Pharmacist

## 2021-03-29 ENCOUNTER — Encounter: Payer: Self-pay | Admitting: Hematology & Oncology

## 2021-03-29 VITALS — BP 126/52 | HR 81 | Temp 99.1°F | Resp 16 | Wt 154.1 lb

## 2021-03-29 DIAGNOSIS — C8339 Diffuse large B-cell lymphoma, extranodal and solid organ sites: Secondary | ICD-10-CM | POA: Diagnosis not present

## 2021-03-29 DIAGNOSIS — C8332 Diffuse large B-cell lymphoma, intrathoracic lymph nodes: Secondary | ICD-10-CM | POA: Diagnosis not present

## 2021-03-29 DIAGNOSIS — C8510 Unspecified B-cell lymphoma, unspecified site: Secondary | ICD-10-CM

## 2021-03-29 DIAGNOSIS — Z79899 Other long term (current) drug therapy: Secondary | ICD-10-CM | POA: Diagnosis not present

## 2021-03-29 LAB — CMP (CANCER CENTER ONLY)
ALT: 16 U/L (ref 0–44)
AST: 17 U/L (ref 15–41)
Albumin: 4.1 g/dL (ref 3.5–5.0)
Alkaline Phosphatase: 84 U/L (ref 38–126)
Anion gap: 7 (ref 5–15)
BUN: 16 mg/dL (ref 8–23)
CO2: 30 mmol/L (ref 22–32)
Calcium: 10.1 mg/dL (ref 8.9–10.3)
Chloride: 102 mmol/L (ref 98–111)
Creatinine: 0.77 mg/dL (ref 0.44–1.00)
GFR, Estimated: >60 mL/min (ref >60)
Glucose, Bld: 101 mg/dL — ABNORMAL HIGH (ref 70–99)
Potassium: 4 mmol/L (ref 3.5–5.1)
Sodium: 139 mmol/L (ref 135–145)
Total Bilirubin: 0.3 mg/dL (ref 0.3–1.2)
Total Protein: 6.4 g/dL — ABNORMAL LOW (ref 6.5–8.1)

## 2021-03-29 LAB — CBC WITH DIFFERENTIAL (CANCER CENTER ONLY)
Abs Immature Granulocytes: 0.14 10*3/uL — ABNORMAL HIGH (ref 0.00–0.07)
Basophils Absolute: 0 10*3/uL (ref 0.0–0.1)
Basophils Relative: 0 %
Eosinophils Absolute: 0 10*3/uL (ref 0.0–0.5)
Eosinophils Relative: 0 %
HCT: 27.6 % — ABNORMAL LOW (ref 36.0–46.0)
Hemoglobin: 9.3 g/dL — ABNORMAL LOW (ref 12.0–15.0)
Immature Granulocytes: 3 %
Lymphocytes Relative: 26 %
Lymphs Abs: 1.4 10*3/uL (ref 0.7–4.0)
MCH: 32 pg (ref 26.0–34.0)
MCHC: 33.7 g/dL (ref 30.0–36.0)
MCV: 94.8 fL (ref 80.0–100.0)
Monocytes Absolute: 0.6 10*3/uL (ref 0.1–1.0)
Monocytes Relative: 12 %
Neutro Abs: 3.1 10*3/uL (ref 1.7–7.7)
Neutrophils Relative %: 59 %
Platelet Count: 341 10*3/uL (ref 150–400)
RBC: 2.91 MIL/uL — ABNORMAL LOW (ref 3.87–5.11)
RDW: 16.7 % — ABNORMAL HIGH (ref 11.5–15.5)
WBC Count: 5.3 10*3/uL (ref 4.0–10.5)
nRBC: 0 % (ref 0.0–0.2)

## 2021-03-29 NOTE — Progress Notes (Signed)
Hematology and Oncology Follow Up Visit  Ariel [name] [id number] [birth year] 66 y.o. 03/29/2021   Principle Diagnosis:  Diffuse large cell non-Hodgkin's lymphoma-CNS involvement  Current Therapy:   Status post chemotherapy with R-ICE/R-MTV --  s/p cycle #3     Interim History:  Ariel [name] is comes in for follow-up.  She really doing nicely.  I must say that she really has done well at the assisted living facility.  She enjoys being there.  The office here taking very good care of her.  She completed her last cycle of chemotherapy on 03/11/2021.  She received R-ICE.  When she was in the hospital, we did do an MRI of the brain.  The radiologist thought that there is a new 3 mm lesion.  This is not definite for progressive disease.  Again we will have to watch this.  She is having no neurological issues.  She did well with the Neupogen that we gave her.  She has had a good appetite.  There has been no nausea or vomiting.  She has had no change in bowel or bladder habits.  There is no headache.  She has had no ocular issues.  Overall, I would say performance status is probably ECOG 1.    Medications:  Current Outpatient Medications:    acyclovir (ZOVIRAX) 200 MG capsule, Take 1 capsule (200 mg total) by mouth 2 (two) times daily., Disp: 60 capsule, Rfl: 3   antiseptic oral rinse (BIOTENE) LIQD, 15 mLs by Mouth Rinse route every 4 (four) hours., Disp: 500 mL, Rfl: 4   ciprofloxacin (CIPRO) 500 MG tablet, Take 1 tablet (500 mg total) by mouth daily with breakfast., Disp: 30 tablet, Rfl: 3   fluconazole (DIFLUCAN) 100 MG tablet, Take 1 tablet (100 mg total) by mouth daily., Disp: 30 tablet, Rfl: 3   OLANZapine (ZYPREXA) 10 MG tablet, Take 1 tablet (10 mg total) by mouth daily., Disp: 30 tablet, Rfl: 2   Sodium Chloride-Sodium Bicarb (SODIUM BICARBONATE/SODIUM CHLORIDE) SOLN, 30 application by Mouth Rinse route every 4 (four) hours., Disp: 1000 mL, Rfl: 3   lidocaine-prilocaine (EMLA) cream,  Apply to affected area once (Patient not taking: Reported on 01/14/2021), Disp: 30 g, Rfl: 3  Allergies:  Allergies  Allergen Reactions   Codeine Nausea Only   Decadron [Dexamethasone]    Plavix [Clopidogrel]     Hives,lip swelling    Prednisone     Steroids caused psychological issues     Past Medical History, Surgical history, Social history, and Family History were reviewed and updated.  Review of Systems: Review of Systems  Constitutional: Negative.   HENT:  Negative.    Eyes: Negative.   Respiratory: Negative.    Cardiovascular: Negative.   Gastrointestinal: Negative.   Endocrine: Negative.   Genitourinary: Negative.    Musculoskeletal: Negative.   Skin: Negative.   Neurological: Negative.   Hematological: Negative.   Psychiatric/Behavioral: Negative.     Physical Exam:  weight is 154 lb 1.9 oz (69.9 kg). Her oral temperature is 99.1 F (37.3 C). Her blood pressure is 126/52 (abnormal) and her pulse is 81. Her respiration is 16 and oxygen saturation is 100%.   Wt Readings from Last 3 Encounters:  03/29/21 154 lb 1.9 oz (69.9 kg)  03/07/21 145 lb 11.2 oz (66.1 kg)  02/22/21 147 lb (66.7 kg)    Physical Exam Vitals reviewed.  HENT:     Head: Normocephalic and atraumatic.  Eyes:     Pupils: Pupils are equal, round, and  reactive to light.  Cardiovascular:     Rate and Rhythm: Normal rate and regular rhythm.     Heart sounds: Normal heart sounds.  Pulmonary:     Effort: Pulmonary effort is normal.     Breath sounds: Normal breath sounds.  Abdominal:     General: Bowel sounds are normal.     Palpations: Abdomen is soft.  Musculoskeletal:        General: No tenderness or deformity. Normal range of motion.     Cervical back: Normal range of motion.  Lymphadenopathy:     Cervical: No cervical adenopathy.  Skin:    General: Skin is warm and dry.     Findings: No erythema or rash.  Neurological:     Mental Status: She is alert and oriented to person, place,  and time.  Psychiatric:        Behavior: Behavior normal.        Thought Content: Thought content normal.        Judgment: Judgment normal.     Lab Results  Component Value Date   WBC 5.3 03/29/2021   HGB 9.3 (L) 03/29/2021   HCT 27.6 (L) 03/29/2021   MCV 94.8 03/29/2021   PLT 341 03/29/2021     Chemistry      Component Value Date/Time   NA 139 03/29/2021 1019   K 4.0 03/29/2021 1019   CL 102 03/29/2021 1019   CO2 30 03/29/2021 1019   BUN 16 03/29/2021 1019   CREATININE 0.77 03/29/2021 1019   CREATININE 0.68 11/18/2012 1439      Component Value Date/Time   CALCIUM 10.1 03/29/2021 1019   ALKPHOS 84 03/29/2021 1019   AST 17 03/29/2021 1019   ALT 16 03/29/2021 1019   BILITOT 0.3 03/29/2021 1019      Impression and Plan: Ariel [name] is a very charming 66 year old white female.  She initially came to [place] with iron deficiency anemia.  We subsequently found that she had diffuse large cell non-Hodgkin's lymphoma.  It had traveled to her brain.  She has had 3 cycles of treatment.  We doing alternate cycles on her.  She will be admitted next Monday for her second cycle of R-MTV.  Again, we will have to watch closely the MRI of the brain to see if there actually is a new lesion.  I am just happy that her last PET scan looked okay with respect to her systemic disease.  For right now, I will plan to have her come back to see [place] after her hospitalization.  I suspect she will be in the hospital for about a week.   Volanda Napoleon, MD 1/31/202310:48 AM

## 2021-03-29 NOTE — Progress Notes (Signed)
Dexamethasone removed from patient's R-MTV careplan per Dr. Antonieta Pert instructions.

## 2021-03-29 NOTE — Addendum Note (Signed)
Addended by: Burney Gauze R on: 03/29/2021 11:59 AM   Modules accepted: Orders

## 2021-03-29 NOTE — Telephone Encounter (Signed)
Called bed placement for pt to be admitted 04/04/21 for 7 day chemotherapy.

## 2021-03-29 NOTE — Progress Notes (Signed)
Plan for admission on 04/04/2021 for high dose Methotrexate (R-MTV). Appropriate people notified including inpatient team, bed placement, and insurance.   Oncology Nurse Navigator Documentation  Oncology Nurse Navigator Flowsheets 03/29/2021  Abnormal Finding Date -  Confirmed Diagnosis Date -  Phase of Treatment -  Chemotherapy Actual Start Date: -  Navigator Follow Up Date: 04/04/2021  Navigator Follow Up Reason: Other:  Production assistant, radio Encounter Type Follow-up Appt  Telephone -  Treatment Initiated Date -  Patient Visit Type MedOnc  Treatment Phase Active Tx  Barriers/Navigation Needs Coordination of Care;Education  Education -  Interventions Coordination of Care  Acuity Level 2-Minimal Needs (1-2 Barriers Identified)  Referrals -  Coordination of Care Other  Education Method -  Support Groups/Services Friends and Family  Time Spent with Patient 30

## 2021-04-04 ENCOUNTER — Inpatient Hospital Stay (HOSPITAL_COMMUNITY)
Admission: AD | Admit: 2021-04-04 | Discharge: 2021-04-14 | DRG: 847 | Disposition: A | Discharge status: Home / Self Care | Payer: Medicare Other | Attending: Hematology & Oncology | Admitting: Hematology & Oncology

## 2021-04-04 ENCOUNTER — Other Ambulatory Visit: Payer: Self-pay | Admitting: Hematology & Oncology

## 2021-04-04 ENCOUNTER — Other Ambulatory Visit: Payer: Self-pay

## 2021-04-04 ENCOUNTER — Inpatient Hospital Stay (HOSPITAL_COMMUNITY): Payer: Medicare Other

## 2021-04-04 ENCOUNTER — Encounter (HOSPITAL_COMMUNITY): Payer: Self-pay | Admitting: Hematology & Oncology

## 2021-04-04 DIAGNOSIS — E7800 Pure hypercholesterolemia, unspecified: Secondary | ICD-10-CM | POA: Diagnosis not present

## 2021-04-04 DIAGNOSIS — Y92239 Unspecified place in hospital as the place of occurrence of the external cause: Secondary | ICD-10-CM | POA: Diagnosis present

## 2021-04-04 DIAGNOSIS — R252 Cramp and spasm: Secondary | ICD-10-CM | POA: Diagnosis not present

## 2021-04-04 DIAGNOSIS — M25462 Effusion, left knee: Secondary | ICD-10-CM | POA: Diagnosis not present

## 2021-04-04 DIAGNOSIS — R5383 Other fatigue: Secondary | ICD-10-CM | POA: Diagnosis not present

## 2021-04-04 DIAGNOSIS — M79605 Pain in left leg: Secondary | ICD-10-CM | POA: Diagnosis not present

## 2021-04-04 DIAGNOSIS — Z8249 Family history of ischemic heart disease and other diseases of the circulatory system: Secondary | ICD-10-CM | POA: Diagnosis not present

## 2021-04-04 DIAGNOSIS — C8331 Diffuse large B-cell lymphoma, lymph nodes of head, face, and neck: Secondary | ICD-10-CM | POA: Diagnosis not present

## 2021-04-04 DIAGNOSIS — C7931 Secondary malignant neoplasm of brain: Secondary | ICD-10-CM

## 2021-04-04 DIAGNOSIS — Z888 Allergy status to other drugs, medicaments and biological substances status: Secondary | ICD-10-CM

## 2021-04-04 DIAGNOSIS — G9389 Other specified disorders of brain: Secondary | ICD-10-CM | POA: Diagnosis not present

## 2021-04-04 DIAGNOSIS — Z825 Family history of asthma and other chronic lower respiratory diseases: Secondary | ICD-10-CM

## 2021-04-04 DIAGNOSIS — Z811 Family history of alcohol abuse and dependence: Secondary | ICD-10-CM

## 2021-04-04 DIAGNOSIS — C8510 Unspecified B-cell lymphoma, unspecified site: Secondary | ICD-10-CM

## 2021-04-04 DIAGNOSIS — Z833 Family history of diabetes mellitus: Secondary | ICD-10-CM | POA: Diagnosis not present

## 2021-04-04 DIAGNOSIS — Z79899 Other long term (current) drug therapy: Secondary | ICD-10-CM

## 2021-04-04 DIAGNOSIS — Z885 Allergy status to narcotic agent status: Secondary | ICD-10-CM | POA: Diagnosis not present

## 2021-04-04 DIAGNOSIS — Z5111 Encounter for antineoplastic chemotherapy: Principal | ICD-10-CM

## 2021-04-04 DIAGNOSIS — Z803 Family history of malignant neoplasm of breast: Secondary | ICD-10-CM

## 2021-04-04 DIAGNOSIS — Z8673 Personal history of transient ischemic attack (TIA), and cerebral infarction without residual deficits: Secondary | ICD-10-CM | POA: Diagnosis not present

## 2021-04-04 DIAGNOSIS — R519 Headache, unspecified: Secondary | ICD-10-CM | POA: Diagnosis not present

## 2021-04-04 DIAGNOSIS — T451X5A Adverse effect of antineoplastic and immunosuppressive drugs, initial encounter: Secondary | ICD-10-CM | POA: Diagnosis present

## 2021-04-04 DIAGNOSIS — Z85820 Personal history of malignant melanoma of skin: Secondary | ICD-10-CM | POA: Diagnosis not present

## 2021-04-04 DIAGNOSIS — M25562 Pain in left knee: Secondary | ICD-10-CM | POA: Diagnosis not present

## 2021-04-04 DIAGNOSIS — D6481 Anemia due to antineoplastic chemotherapy: Secondary | ICD-10-CM | POA: Diagnosis not present

## 2021-04-04 DIAGNOSIS — M1712 Unilateral primary osteoarthritis, left knee: Secondary | ICD-10-CM | POA: Diagnosis not present

## 2021-04-04 DIAGNOSIS — C8332 Diffuse large B-cell lymphoma, intrathoracic lymph nodes: Secondary | ICD-10-CM | POA: Diagnosis not present

## 2021-04-04 DIAGNOSIS — M1711 Unilateral primary osteoarthritis, right knee: Secondary | ICD-10-CM | POA: Diagnosis not present

## 2021-04-04 DIAGNOSIS — E876 Hypokalemia: Secondary | ICD-10-CM | POA: Diagnosis not present

## 2021-04-04 DIAGNOSIS — M170 Bilateral primary osteoarthritis of knee: Secondary | ICD-10-CM | POA: Diagnosis present

## 2021-04-04 DIAGNOSIS — Z8572 Personal history of non-Hodgkin lymphomas: Secondary | ICD-10-CM | POA: Diagnosis not present

## 2021-04-04 DIAGNOSIS — C8333 Diffuse large B-cell lymphoma, intra-abdominal lymph nodes: Secondary | ICD-10-CM | POA: Diagnosis not present

## 2021-04-04 DIAGNOSIS — M7989 Other specified soft tissue disorders: Secondary | ICD-10-CM | POA: Diagnosis not present

## 2021-04-04 DIAGNOSIS — C8330 Diffuse large B-cell lymphoma, unspecified site: Secondary | ICD-10-CM

## 2021-04-04 DIAGNOSIS — R52 Pain, unspecified: Secondary | ICD-10-CM

## 2021-04-04 LAB — CBC WITH DIFFERENTIAL/PLATELET
Abs Immature Granulocytes: 0.05 10*3/uL (ref 0.00–0.07)
Basophils Absolute: 0 10*3/uL (ref 0.0–0.1)
Basophils Relative: 0 %
Eosinophils Absolute: 0 10*3/uL (ref 0.0–0.5)
Eosinophils Relative: 0 %
HCT: 31.1 % — ABNORMAL LOW (ref 36.0–46.0)
Hemoglobin: 10.3 g/dL — ABNORMAL LOW (ref 12.0–15.0)
Immature Granulocytes: 1 %
Lymphocytes Relative: 29 %
Lymphs Abs: 1.5 10*3/uL (ref 0.7–4.0)
MCH: 32 pg (ref 26.0–34.0)
MCHC: 33.1 g/dL (ref 30.0–36.0)
MCV: 96.6 fL (ref 80.0–100.0)
Monocytes Absolute: 0.8 10*3/uL (ref 0.1–1.0)
Monocytes Relative: 15 %
Neutro Abs: 2.8 10*3/uL (ref 1.7–7.7)
Neutrophils Relative %: 55 %
Platelets: 473 10*3/uL — ABNORMAL HIGH (ref 150–400)
RBC: 3.22 MIL/uL — ABNORMAL LOW (ref 3.87–5.11)
RDW: 17.2 % — ABNORMAL HIGH (ref 11.5–15.5)
WBC: 5.1 10*3/uL (ref 4.0–10.5)
nRBC: 0 % (ref 0.0–0.2)

## 2021-04-04 LAB — COMPREHENSIVE METABOLIC PANEL
ALT: 21 U/L (ref 0–44)
AST: 21 U/L (ref 15–41)
Albumin: 4.2 g/dL (ref 3.5–5.0)
Alkaline Phosphatase: 71 U/L (ref 38–126)
Anion gap: 7 (ref 5–15)
BUN: 18 mg/dL (ref 8–23)
CO2: 29 mmol/L (ref 22–32)
Calcium: 9.6 mg/dL (ref 8.9–10.3)
Chloride: 102 mmol/L (ref 98–111)
Creatinine, Ser: 0.68 mg/dL (ref 0.44–1.00)
GFR, Estimated: >60 mL/min (ref >60)
Glucose, Bld: 99 mg/dL (ref 70–99)
Potassium: 4.1 mmol/L (ref 3.5–5.1)
Sodium: 138 mmol/L (ref 135–145)
Total Bilirubin: 0.2 mg/dL — ABNORMAL LOW (ref 0.3–1.2)
Total Protein: 6.9 g/dL (ref 6.5–8.1)

## 2021-04-04 LAB — URINALYSIS, ROUTINE W REFLEX MICROSCOPIC
Bilirubin Urine: NEGATIVE
Glucose, UA: NEGATIVE mg/dL
Hgb urine dipstick: NEGATIVE
Ketones, ur: NEGATIVE mg/dL
Leukocytes,Ua: NEGATIVE
Nitrite: NEGATIVE
Protein, ur: NEGATIVE mg/dL
Specific Gravity, Urine: 1.01 (ref 1.005–1.030)
pH: 6 (ref 5.0–8.0)

## 2021-04-04 LAB — PHOSPHORUS: Phosphorus: 4.4 mg/dL (ref 2.5–4.6)

## 2021-04-04 LAB — MAGNESIUM: Magnesium: 2.2 mg/dL (ref 1.7–2.4)

## 2021-04-04 IMAGING — DX DG CHEST 2V
2 series · 2 of 2 positions shown · non-contrast
Comparison: CT chest dated [DATE]. Chest x-ray dated
[DATE].

CLINICAL DATA: History of lymphoma.

EXAM:
CHEST - 2 VIEW

[chest pa]
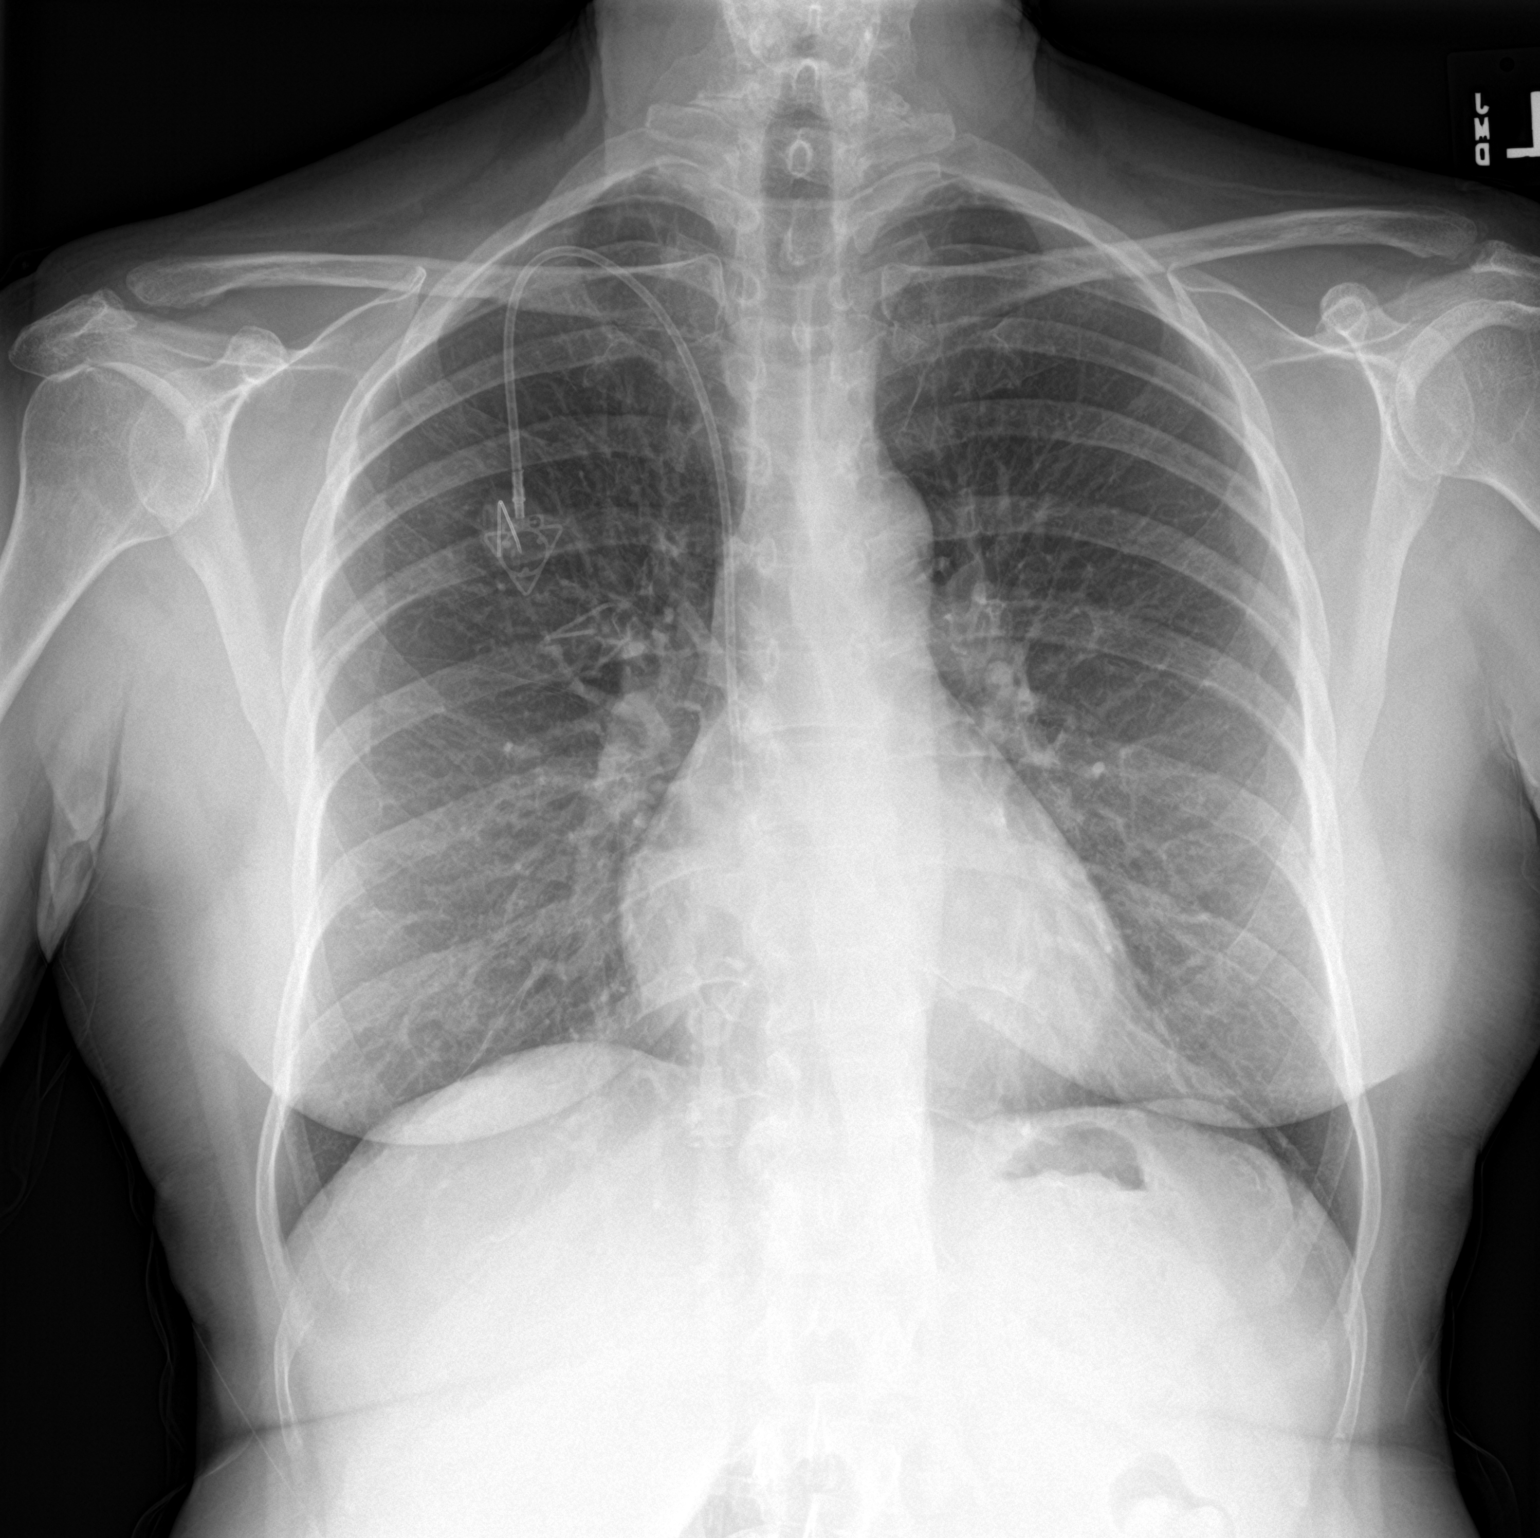

[chest lat]
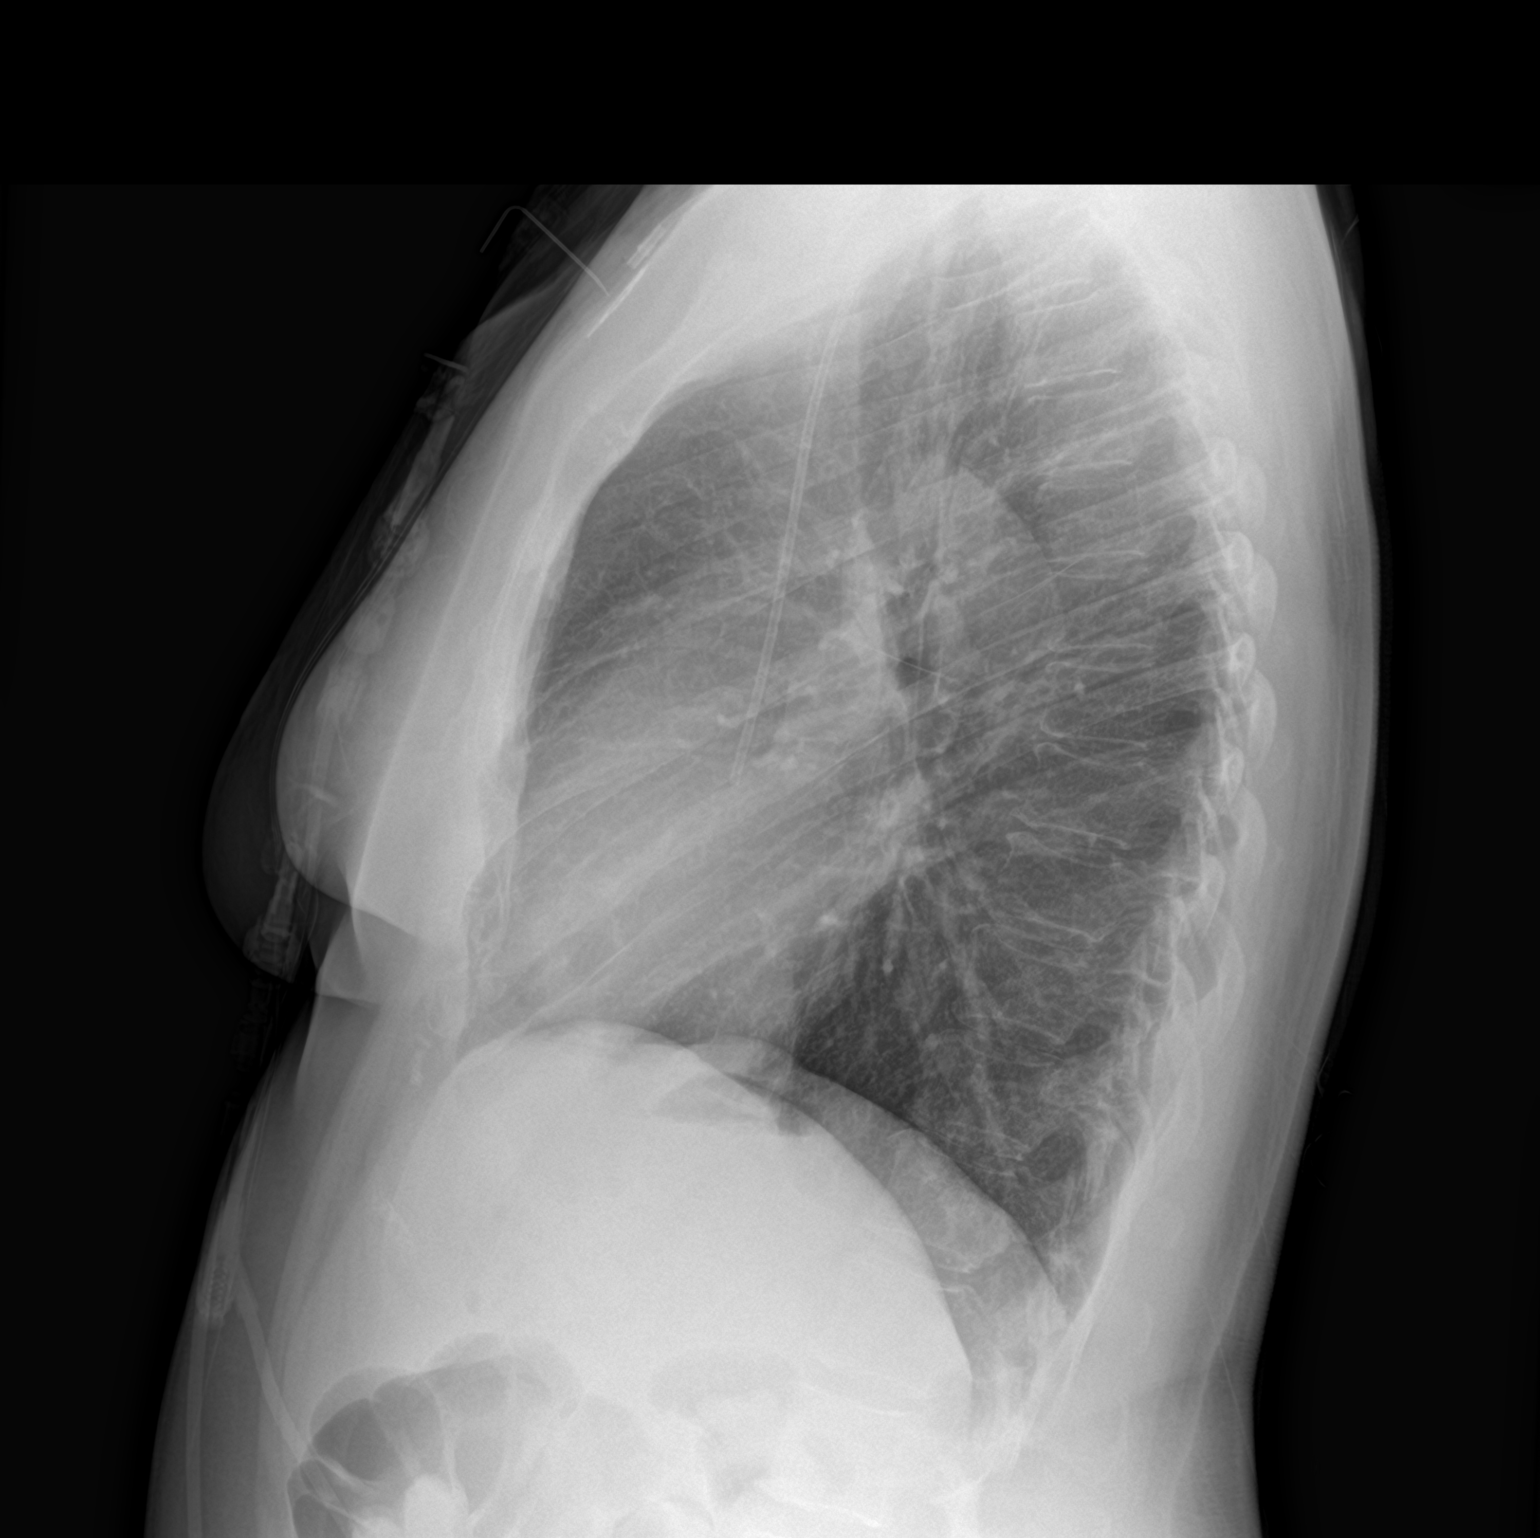

[2 of 2 positions shown; findings below may reference images not displayed]

FINDINGS: Unchanged right chest wall port catheter. The heart size and
mediastinal contours are within normal limits. Normal pulmonary
vascularity. No focal consolidation, pleural effusion, or
pneumothorax. No acute osseous abnormality.
IMPRESSION: 1. No acute cardiopulmonary disease.

## 2021-04-04 MED ORDER — SODIUM BICARBONATE/SODIUM CHLORIDE MOUTHWASH
30.0000 "application " | OROMUCOSAL | Status: DC
  Administered 2021-04-04 – 2021-04-14 (×41): 30 via OROMUCOSAL
  Filled 2021-04-04: qty 2000

## 2021-04-04 MED ORDER — ACYCLOVIR 200 MG PO CAPS
200.0000 mg | ORAL_CAPSULE | Freq: Two times a day (BID) | ORAL | Status: DC
  Administered 2021-04-04 – 2021-04-14 (×20): 200 mg via ORAL
  Filled 2021-04-04 (×21): qty 1

## 2021-04-04 MED ORDER — ACETAMINOPHEN 325 MG PO TABS
650.0000 mg | ORAL_TABLET | Freq: Once | ORAL | Status: AC
  Administered 2021-04-04: 650 mg via ORAL
  Filled 2021-04-04: qty 2

## 2021-04-04 MED ORDER — ENOXAPARIN SODIUM 40 MG/0.4ML IJ SOSY
40.0000 mg | PREFILLED_SYRINGE | Freq: Every day | INTRAMUSCULAR | Status: DC
  Administered 2021-04-04 – 2021-04-14 (×11): 40 mg via SUBCUTANEOUS
  Filled 2021-04-04 (×11): qty 0.4

## 2021-04-04 MED ORDER — SODIUM BICARBONATE 8.4 % IV SOLN
INTRAVENOUS | Status: DC
  Filled 2021-04-04 (×3): qty 150
  Filled 2021-04-04 (×6): qty 1000
  Filled 2021-04-04 (×2): qty 150
  Filled 2021-04-04: qty 1000
  Filled 2021-04-04 (×2): qty 150
  Filled 2021-04-04 (×2): qty 1000
  Filled 2021-04-04 (×4): qty 150
  Filled 2021-04-04: qty 1000
  Filled 2021-04-04 (×4): qty 150

## 2021-04-04 MED ORDER — DOCUSATE SODIUM 100 MG PO CAPS
100.0000 mg | ORAL_CAPSULE | Freq: Two times a day (BID) | ORAL | Status: DC
  Administered 2021-04-04 – 2021-04-14 (×21): 100 mg via ORAL
  Filled 2021-04-04 (×21): qty 1

## 2021-04-04 MED ORDER — DIPHENHYDRAMINE HCL 50 MG/ML IJ SOLN
25.0000 mg | Freq: Once | INTRAMUSCULAR | Status: AC
  Administered 2021-04-04: 25 mg via INTRAVENOUS
  Filled 2021-04-04: qty 1

## 2021-04-04 MED ORDER — SODIUM CHLORIDE 0.9 % IV SOLN
500.0000 mg/m2 | Freq: Once | INTRAVENOUS | Status: AC
  Administered 2021-04-04: 900 mg via INTRAVENOUS
  Filled 2021-04-04: qty 90

## 2021-04-04 MED ORDER — BIOTENE DRY MOUTH MT LIQD
15.0000 mL | OROMUCOSAL | Status: DC
  Administered 2021-04-04 – 2021-04-14 (×31): 15 mL via OROMUCOSAL
  Filled 2021-04-04: qty 473
  Filled 2021-04-04: qty 237
  Filled 2021-04-04: qty 15

## 2021-04-04 MED ORDER — POTASSIUM CHLORIDE IN NACL 20-0.9 MEQ/L-% IV SOLN
INTRAVENOUS | Status: DC
  Filled 2021-04-04 (×7): qty 1000

## 2021-04-04 NOTE — H&P (Addendum)
[place]  [phone]) [phone] [phone]) [phone]   MEDICAL ONCOLOGY - ADMISSION H&P  Reason for Admission: Diffuse large cell non-Hodgkin's lymphoma with CNS involvement, administration of systemic chemotherapy  HPI: 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  She has been receiving systemic chemotherapy with R-ICE/R-MTV.  The patient was seen this morning on the day of admission.  She reports that she is feeling well.  She had some fatigue following her last cycle of chemotherapy but overall states that she did well with it.  She is not having any mouth sores, headaches, dizziness, fevers, chills, chest pain, shortness of breath, abdominal pain, nausea, vomiting, constipation, diarrhea.  Denies bleeding.  She will be admitted today to begin cycle #3 of R-ICE/R-MTV.   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   Hyperglycemia 11/10/2020   Melanoma ([place]) 2021   right hip   PONV (postoperative nausea and vomiting)    Urinary incontinence   :   Past Surgical History:  Procedure Laterality Date   AIR/FLUID EXCHANGE Right 12/07/2020   Procedure: AIR/FLUID EXCHANGE;  Surgeon: Jalene Mullet, MD;  Location: [place];  Service: Ophthalmology;  Laterality: Right;   APPENDECTOMY  1962   BIOPSY  12/14/2020   Procedure: BIOPSY;  Surgeon: Rush Landmark Telford Nab., MD;  Location: [place];  Service: Gastroenterology;;   ESOPHAGOGASTRODUODENOSCOPY (EGD) WITH PROPOFOL N/A 12/14/2020    Procedure: ESOPHAGOGASTRODUODENOSCOPY (EGD) WITH PROPOFOL;  Surgeon: Irving Copas., MD;  Location: [facility];  Service: Gastroenterology;  Laterality: N/A;   EXCISION MELANOMA WITH SENTINEL LYMPH NODE BIOPSY Right 10/09/2019   Procedure: WIDE LOCAL EXCISION WITH ADVANCEMENT FLAP CLOSURE RIGHT HIP MELANOMA WITH SENTINEL LYMPH NODE BIOPSY;  Surgeon: Stark Klein, MD;  Location: [facility];  Service: General;  Laterality: Right;   FINE NEEDLE ASPIRATION  12/14/2020   Procedure: FINE NEEDLE ASPIRATION (FNA) LINEAR;  Surgeon: Irving Copas., MD;  Location: [place];  Service: Gastroenterology;;   HERNIA REPAIR  2009   IR IMAGING GUIDED PORT INSERTION  12/30/2020   MOUTH SURGERY  11/2015   gum surgery and bone implant   PARS PLANA VITRECTOMY Right 12/07/2020   Procedure: PARS PLANA VITRECTOMY WITH 25 GAUGE;  Surgeon: Jalene Mullet, MD;  Location: [facility];  Service: Ophthalmology;  Laterality: Right;   PHOTOCOAGULATION WITH LASER Right 12/07/2020   Procedure: PHOTOCOAGULATION WITH ENDOLASER PANRITINAL COAGULATION;  Surgeon: Jalene Mullet, MD;  Location: [place];  Service: Ophthalmology;  Laterality: Right;   UPPER ESOPHAGEAL ENDOSCOPIC ULTRASOUND (EUS) N/A 12/14/2020   Procedure: UPPER ESOPHAGEAL ENDOSCOPIC ULTRASOUND (EUS);  Surgeon: Irving Copas., MD;  Location: [place] [facility];  Service: Gastroenterology;  Laterality: N/A;   WISDOM TOOTH EXTRACTION    :   No current facility-administered medications for this encounter.      Allergies  Allergen Reactions   Codeine Nausea Only   Decadron [Dexamethasone]    Plavix [Clopidogrel]     Hives,lip swelling    Prednisone     Steroids caused psychological issues   :  Family History  Problem Relation Age of Onset   Coronary artery disease Father        died at 72   COPD Father    Alcohol abuse Father    Diabetes Mellitus II Father    Coronary artery disease Sister    Cancer Sister        breast   Heart attack  Brother    Hyperlipidemia Other        died 50- "natural causes"   Hyperlipidemia Other        4 siblings    Hypertension Other        3 siblings  :   Social History   Socioeconomic History   Marital status: Widowed    Spouse name: Not on file   Number of children: Not on file   Years of education: Not on file   Highest education level: Not on file  Occupational History   Occupation: retired  Tobacco Use   Smoking status: Never   Smokeless tobacco: Never  Vaping Use   Vaping Use: Never used  Substance and Sexual Activity   Alcohol use: Never   Drug use: Never   Sexual activity: Not Currently  Other Topics Concern   Not on file  Social History Narrative   Lives with husband   One Son- lives locally, 1 grand-daughter   Daughter- Sport and exercise psychologist. [place]   Completed Hotel manager school   Works as a Probation officer.         Social Determinants of Radio broadcast assistant Strain: Low Risk    Difficulty of Paying Living Expenses: Not hard at all  Food Insecurity: No Food Insecurity   Worried About Charity fundraiser in the Last Year: Never true   Arboriculturist in the Last Year: Never true  Transportation Needs: No Transportation Needs   Lack of Transportation (Medical): No   Lack of Transportation (Non-Medical): No  Physical Activity: Sufficiently Active   Days of Exercise per Week: 4 days   Minutes of Exercise per Session: 40 min  Stress: No Stress Concern Present   Feeling of Stress : Not at all  Social Connections: Moderately Isolated   Frequency of Communication with Friends and Family: More than three times a week   Frequency of Social Gatherings with Friends and Family: More than three times a week   Attends Religious Services: More than 4 times per year   Active Member of [employer] or Organizations: No   Attends Archivist Meetings: Never   Marital Status: Widowed  Human resources officer Violence: Not At Risk   Fear of Current or Ex-Partner: No   Emotionally  Abused: No   Physically Abused: No   Sexually Abused: No  :  Review of Systems: A comprehensive 14 point review of systems was negative except as noted in the HPI.  Exam: Patient Vitals for the past 24 hrs:  BP Temp Temp src Pulse Resp SpO2  04/04/21 1030 121/60 97.8 F (36.6 C) Oral 69 16 99 %    General:  well-nourished in no acute distress.   Eyes:  no scleral icterus.   ENT:  There were no oropharyngeal lesions.   Lymphatics:  Negative cervical, supraclavicular or axillary adenopathy.   Respiratory: lungs were clear bilaterally without wheezing or crackles.   Cardiovascular:  Regular rate and rhythm, S1/S2, without murmur, rub or gallop.  There was no pedal edema.   GI:  abdomen was soft, flat, nontender, nondistended,  without organomegaly.    Skin exam was without echymosis, petichae.   Neuro exam was nonfocal. Patient was alert and oriented.  Attention was good.   Language was appropriate.  Mood was normal without depression.  Speech was not pressured.  Thought content was not tangential.     Lab Results  Component Value Date   WBC 5.3 03/29/2021   HGB 9.3 (L) 03/29/2021   HCT 27.6 (L) 03/29/2021   PLT 341 03/29/2021   GLUCOSE 101 (H) 03/29/2021   CHOL 134 10/07/2020   TRIG 36 10/07/2020   HDL 44 10/07/2020   LDLCALC 83 10/07/2020   ALT 16 03/29/2021   AST 17 03/29/2021   NA 139 03/29/2021   K 4.0 03/29/2021   CL 102 03/29/2021   CREATININE 0.77 03/29/2021   BUN 16 03/29/2021   CO2 30 03/29/2021    MR BRAIN W WO CONTRAST  Result Date: 03/11/2021 CLINICAL DATA:  Hematologic malignancy, assess treatment response; status post chemotherapy for CNS lymphoma EXAM: MRI HEAD WITHOUT AND WITH CONTRAST TECHNIQUE: Multiplanar, multiecho pulse sequences of the brain and surrounding structures were obtained without and with intravenous contrast. CONTRAST:  41mL GADAVIST GADOBUTROL 1 MMOL/ML IV SOLN COMPARISON:  02/07/2021 FINDINGS: Brain: Left posterior temporal lesion is  again identified with unchanged curvilinear enhancement. Adjacent T2 FLAIR hyperintensity is mildly decreased. As before, there is some associated susceptibility. Small left posterior parietal lesion remains nonenhancing and has decreased in size (series 11, image 36). There is a new 3 mm enhancing lesion of the left postcentral gyrus. Mild surrounding edema. Corresponding facilitated diffusion. There is new T2 FLAIR hyperintensity involving the left brachium pontis without enhancement. Appears to be corresponding facilitated diffusion on ADC. New T2 FLAIR hyperintensity involving the right precentral gyrus including cortex (series 11, image 44). As above, there is no enhancement and some facilitated diffusion on ADC There is no acute infarction. Stable distal foci of T2 hyperintensity in the supratentorial white matter that may reflect chronic microvascular ischemic changes. Unchanged few scattered foci of susceptibility likely reflecting chronic blood products. Vascular: Major vessel flow voids at the skull base are preserved. Skull and upper cervical spine: Normal marrow signal is preserved. Sinuses/Orbits: Paranasal sinuses are aerated. Orbits are unremarkable. Other: Sella is unremarkable.  Mastoid air cells are clear. IMPRESSION: New 3 mm enhancing lesion of the left postcentral gyrus with mild edema. New nonenhancing T2 FLAIR hyperintensity in the left brachium pontis and right precentral gyrus. Short-term follow-up is recommended. Stable appearance of left temporal lesion with mild decrease in surrounding edema. Decrease in size of nonenhancing left posterior parietal lesion. Electronically Signed   By: Macy Mis M.D.   On: 03/11/2021 09:49     MR BRAIN W WO CONTRAST  Result Date: 03/11/2021 CLINICAL DATA:  Hematologic malignancy, assess treatment response; status post chemotherapy for CNS lymphoma EXAM: MRI HEAD WITHOUT AND WITH CONTRAST TECHNIQUE: Multiplanar, multiecho pulse sequences of the  brain and surrounding structures were obtained without and with intravenous contrast. CONTRAST:  58mL GADAVIST GADOBUTROL 1 MMOL/ML IV SOLN COMPARISON:  02/07/2021 FINDINGS: Brain: Left posterior temporal lesion is again identified with unchanged curvilinear enhancement. Adjacent T2 FLAIR hyperintensity is mildly decreased. As before, there is some associated susceptibility. Small left posterior parietal lesion remains nonenhancing and has decreased in size (series 11, image 36). There is a new 3 mm enhancing lesion of the left postcentral gyrus. Mild surrounding edema. Corresponding facilitated diffusion. There is new T2 FLAIR hyperintensity involving the left brachium pontis without enhancement.  Appears to be corresponding facilitated diffusion on ADC. New T2 FLAIR hyperintensity involving the right precentral gyrus including cortex (series 11, image 44). As above, there is no enhancement and some facilitated diffusion on ADC There is no acute infarction. Stable distal foci of T2 hyperintensity in the supratentorial white matter that may reflect chronic microvascular ischemic changes. Unchanged few scattered foci of susceptibility likely reflecting chronic blood products. Vascular: Major vessel flow voids at the skull base are preserved. Skull and upper cervical spine: Normal marrow signal is preserved. Sinuses/Orbits: Paranasal sinuses are aerated. Orbits are unremarkable. Other: Sella is unremarkable.  Mastoid air cells are clear. IMPRESSION: New 3 mm enhancing lesion of the left postcentral gyrus with mild edema. New nonenhancing T2 FLAIR hyperintensity in the left brachium pontis and right precentral gyrus. Short-term follow-up is recommended. Stable appearance of left temporal lesion with mild decrease in surrounding edema. Decrease in size of nonenhancing left posterior parietal lesion. Electronically Signed   By: Macy Mis M.D.   On: 03/11/2021 09:49    Assessment and Plan:  1.  Diffuse large cell  non-Hodgkin's lymphoma with CNS involvement 2.  Mild anemia secondary to chemotherapy   -The patient was seen and examined this morning.  She feels well and we will plan to begin cycle #3 of systemic chemotherapy today. -Obtain baseline lab work including CBC, CMET, magnesium, phosphorus, UA. -Begin normal saline with 20 mEq of potassium chloride at 50 cc/h. -Lovenox for DVT prophylaxis. -Regular diet. -Continue home acyclovir 200 mg twice daily. -We will add Colace 100 mg twice a day to prevent constipation. -Continue Biotene mouth rinse and sodium bicarb mouth rinse to prevent mouth sores.  Mikey Bussing, DNP, AGPCNP-BC, AOCNP   ADDENDUM: I agree with the above note by Erasmo Downer.  We will have to make sure that her urine is well alkalize before we start the high-dose methotrexate.  At some point, we will also have to check another MRI of the brain to see if there is any change in this 3 mm area that was detected in the last MRI.  We will check her labs.  Again, the key would make sure that everything is alkaline before we do the methotrexate.  [name], MD  Psalm 112:7

## 2021-04-05 ENCOUNTER — Encounter: Payer: Self-pay | Admitting: *Deleted

## 2021-04-05 DIAGNOSIS — C8332 Diffuse large B-cell lymphoma, intrathoracic lymph nodes: Secondary | ICD-10-CM | POA: Diagnosis not present

## 2021-04-05 DIAGNOSIS — D6481 Anemia due to antineoplastic chemotherapy: Secondary | ICD-10-CM | POA: Diagnosis not present

## 2021-04-05 DIAGNOSIS — Z79899 Other long term (current) drug therapy: Secondary | ICD-10-CM | POA: Diagnosis not present

## 2021-04-05 LAB — URINALYSIS, ROUTINE W REFLEX MICROSCOPIC
Bilirubin Urine: NEGATIVE
Glucose, UA: NEGATIVE mg/dL
Hgb urine dipstick: NEGATIVE
Ketones, ur: NEGATIVE mg/dL
Leukocytes,Ua: NEGATIVE
Nitrite: NEGATIVE
Protein, ur: NEGATIVE mg/dL
Specific Gravity, Urine: 1.006 (ref 1.005–1.030)
pH: 8 (ref 5.0–8.0)

## 2021-04-05 LAB — CBC
HCT: 27.3 % — ABNORMAL LOW (ref 36.0–46.0)
Hemoglobin: 9.2 g/dL — ABNORMAL LOW (ref 12.0–15.0)
MCH: 32.6 pg (ref 26.0–34.0)
MCHC: 33.7 g/dL (ref 30.0–36.0)
MCV: 96.8 fL (ref 80.0–100.0)
Platelets: 353 10*3/uL (ref 150–400)
RBC: 2.82 MIL/uL — ABNORMAL LOW (ref 3.87–5.11)
RDW: 16.9 % — ABNORMAL HIGH (ref 11.5–15.5)
WBC: 4 10*3/uL (ref 4.0–10.5)
nRBC: 0 % (ref 0.0–0.2)

## 2021-04-05 LAB — COMPREHENSIVE METABOLIC PANEL
ALT: 17 U/L (ref 0–44)
AST: 20 U/L (ref 15–41)
Albumin: 3.3 g/dL — ABNORMAL LOW (ref 3.5–5.0)
Alkaline Phosphatase: 61 U/L (ref 38–126)
Anion gap: 5 (ref 5–15)
BUN: 15 mg/dL (ref 8–23)
CO2: 31 mmol/L (ref 22–32)
Calcium: 9 mg/dL (ref 8.9–10.3)
Chloride: 104 mmol/L (ref 98–111)
Creatinine, Ser: 0.72 mg/dL (ref 0.44–1.00)
GFR, Estimated: >60 mL/min (ref >60)
Glucose, Bld: 143 mg/dL — ABNORMAL HIGH (ref 70–99)
Potassium: 3.5 mmol/L (ref 3.5–5.1)
Sodium: 140 mmol/L (ref 135–145)
Total Bilirubin: 0.3 mg/dL (ref 0.3–1.2)
Total Protein: 5.6 g/dL — ABNORMAL LOW (ref 6.5–8.1)

## 2021-04-05 MED ORDER — VINCRISTINE SULFATE CHEMO INJECTION 1 MG/ML
2.0000 mg | Freq: Once | INTRAVENOUS | Status: AC
  Administered 2021-04-05: 2 mg via INTRAVENOUS
  Filled 2021-04-05: qty 2

## 2021-04-05 MED ORDER — THIOTEPA CHEMO INJECTION 15 MG
60.0000 mg | Freq: Once | INTRAVENOUS | Status: AC
  Administered 2021-04-05: 60 mg via INTRAVENOUS
  Filled 2021-04-05: qty 5.77

## 2021-04-05 MED ORDER — SODIUM CHLORIDE 0.9 % IV SOLN
Freq: Once | INTRAVENOUS | Status: AC
  Administered 2021-04-05: 8 mg via INTRAVENOUS
  Filled 2021-04-05: qty 4

## 2021-04-05 MED ORDER — HOT PACK MISC ONCOLOGY
1.0000 | Freq: Once | Status: DC | PRN
  Filled 2021-04-05: qty 1

## 2021-04-05 MED ORDER — CHLORHEXIDINE GLUCONATE CLOTH 2 % EX PADS
6.0000 | MEDICATED_PAD | Freq: Every day | CUTANEOUS | Status: DC
  Administered 2021-04-05 – 2021-04-14 (×10): 6 via TOPICAL

## 2021-04-05 NOTE — Progress Notes (Signed)
Patient admitted and began treatment yesterday. Will follow for post discharge needs and follow up.   Oncology Nurse Navigator Documentation  Oncology Nurse Navigator Flowsheets 04/05/2021  Abnormal Finding Date -  Confirmed Diagnosis Date -  Phase of Treatment -  Chemotherapy Actual Start Date: -  Navigator Follow Up Date: 04/08/2021  Navigator Follow Up Reason: Appointment Review  Navigator Location [facility]  Navigator Encounter Type Appt/Treatment Plan Review  Telephone -  Treatment Initiated Date -  Patient Visit Type MedOnc  Treatment Phase Active Tx  Barriers/Navigation Needs Coordination of Care;Education  Education -  Interventions None Required  Acuity Level 2-Minimal Needs (1-2 Barriers Identified)  Referrals -  Coordination of Care -  Education Method -  Support Groups/Services Friends and Family  Time Spent with Patient 15

## 2021-04-05 NOTE — Progress Notes (Signed)
Ms. Robeck is doing well this morning.  She Rituxan yesterday.  She tolerated this well.  She has had no problem with nausea or vomiting.  She had a little bit of a headache.  This got better.  She has had no mouth sores.  She has had no nausea or vomiting.  She has had no fever.  She has had no bleeding.  There is no change in bowel or bladder habits.  Her labs show white cell count of 4000.  Hemoglobin 9.2.  Platelet count is 3 53,000.  Her BUN is 15 creatinine 0.72.  She is ambulating well.  She has had no rashes.  Her vital signs show temperature 98.3.  Pulse 91.  Blood pressure 124/56.  Her head neck exam shows no oral lesions.  She has no adenopathy in the neck.  Lungs are clear bilaterally.  Cardiac exam regular rate and rhythm.  Abdomen is soft.  Neurological exam is nonfocal.  She will continue chemotherapy today.  The urine pH is 8.  This is alkaline.  This is what we would like.  She will get I think increasing and without tap today.  I think she will then receive methotrexate tomorrow.  I know that she will get incredible care from all the staff up on [address].  I do appreciate all of their compassion and care.  [name], MD  Romans 5:3-5

## 2021-04-06 ENCOUNTER — Encounter: Payer: Self-pay | Admitting: Hematology & Oncology

## 2021-04-06 DIAGNOSIS — C8332 Diffuse large B-cell lymphoma, intrathoracic lymph nodes: Secondary | ICD-10-CM | POA: Diagnosis not present

## 2021-04-06 LAB — URINALYSIS, ROUTINE W REFLEX MICROSCOPIC
Bilirubin Urine: NEGATIVE
Glucose, UA: NEGATIVE mg/dL
Hgb urine dipstick: NEGATIVE
Ketones, ur: NEGATIVE mg/dL
Leukocytes,Ua: NEGATIVE
Nitrite: NEGATIVE
Protein, ur: NEGATIVE mg/dL
Specific Gravity, Urine: 1.006 (ref 1.005–1.030)
pH: 9 — ABNORMAL HIGH (ref 5.0–8.0)

## 2021-04-06 MED ORDER — SODIUM CHLORIDE 0.9 % IV SOLN
500.0000 mg/m2 | Freq: Once | INTRAVENOUS | Status: AC
  Administered 2021-04-06: 900 mg via INTRAVENOUS
  Filled 2021-04-06: qty 36

## 2021-04-06 MED ORDER — SODIUM CHLORIDE 0.9 % IV SOLN
Freq: Once | INTRAVENOUS | Status: AC
  Administered 2021-04-06: 16 mg via INTRAVENOUS
  Filled 2021-04-06: qty 8

## 2021-04-06 MED ORDER — SODIUM CHLORIDE 0.9 % IV SOLN
3.0000 g/m2 | Freq: Once | INTRAVENOUS | Status: AC
  Administered 2021-04-06: 5.4 g via INTRAVENOUS
  Filled 2021-04-06: qty 216

## 2021-04-06 NOTE — Progress Notes (Signed)
Overall, Ariel [name] is doing well.  She had no problems with chemotherapy yesterday.  I think she gets her methotrexate today.  She has had no nausea.  There is been no mouth sores.  She is a little bit constipated but had a bowel movement this morning.  She is well ambulating.  She is having no problems with cough.  There is no bleeding.  There is no rashes.  Her vital signs show temperature 97.9.  Pulse 74.  Blood pressure 105/49.  Her head neck exam shows no ocular or oral lesions.  There are no palpable cervical or supraclavicular lymph nodes.  Lungs are clear.  Cardiac exam regular rate and rhythm.  Abdomen is soft.  Extremity shows no clubbing, cyanosis or edema.  Neurological exam shows no focal neurological deficits.  We will proceed with the high-dose methotrexate today.  We will then have to follow her methotrexate levels.  We will have to see how quickly they come down.  Want to monitor her lab work with despite her kidneys and her liver.  I am just happy that she is done well so far.  We will plan for an MRI of the brain probably over the weekend.  Hopefully, we will be able to get her home early next week depending on her methotrexate levels.  [name], MD  [name] 6:9-10

## 2021-04-06 NOTE — Progress Notes (Signed)
Pharmacy Brief Note - MTX Monitoring:  Confirmed MTX monitoring plan with Dr. Marin Olp.   1st MTX level will be drawn on 2/9 @ 12:30 pm (24 hrs after initiation of infusion). Subsequent levels to be drawn with AM labs daily starting on 2/10. Confirmed with [facility] lab that they are able to run levels 24 hrs/day as received.   Leucovorin 28 mg (15 mg/m2) to begin on 2/9 @ 12:30, which is 24 hours after initiation of methotrexate infusion. Will release orders tomorrow.  Lenis Noon, PharmD 04/06/21 2:37 PM

## 2021-04-06 NOTE — Progress Notes (Signed)
Chemotherapy dosages and calculations verified with Vergia Alcon, RN.

## 2021-04-06 NOTE — Progress Notes (Signed)
Methotrexate 3.5g/m2 over 3 hours today per Dr Marin Olp.

## 2021-04-07 ENCOUNTER — Inpatient Hospital Stay (HOSPITAL_COMMUNITY): Payer: Medicare Other

## 2021-04-07 ENCOUNTER — Encounter: Payer: Self-pay | Admitting: Hematology & Oncology

## 2021-04-07 DIAGNOSIS — E876 Hypokalemia: Secondary | ICD-10-CM

## 2021-04-07 DIAGNOSIS — C8333 Diffuse large B-cell lymphoma, intra-abdominal lymph nodes: Secondary | ICD-10-CM

## 2021-04-07 DIAGNOSIS — M79605 Pain in left leg: Secondary | ICD-10-CM

## 2021-04-07 DIAGNOSIS — Z79899 Other long term (current) drug therapy: Secondary | ICD-10-CM | POA: Diagnosis not present

## 2021-04-07 LAB — COMPREHENSIVE METABOLIC PANEL
ALT: 21 U/L (ref 0–44)
AST: 24 U/L (ref 15–41)
Albumin: 3 g/dL — ABNORMAL LOW (ref 3.5–5.0)
Alkaline Phosphatase: 52 U/L (ref 38–126)
Anion gap: 5 (ref 5–15)
BUN: 10 mg/dL (ref 8–23)
CO2: 33 mmol/L — ABNORMAL HIGH (ref 22–32)
Calcium: 8.4 mg/dL — ABNORMAL LOW (ref 8.9–10.3)
Chloride: 99 mmol/L (ref 98–111)
Creatinine, Ser: 0.71 mg/dL (ref 0.44–1.00)
GFR, Estimated: >60 mL/min (ref >60)
Glucose, Bld: 118 mg/dL — ABNORMAL HIGH (ref 70–99)
Potassium: 3 mmol/L — ABNORMAL LOW (ref 3.5–5.1)
Sodium: 137 mmol/L (ref 135–145)
Total Bilirubin: 0.4 mg/dL (ref 0.3–1.2)
Total Protein: 5.1 g/dL — ABNORMAL LOW (ref 6.5–8.1)

## 2021-04-07 LAB — CBC WITH DIFFERENTIAL/PLATELET
Abs Immature Granulocytes: 0.01 10*3/uL (ref 0.00–0.07)
Basophils Absolute: 0 10*3/uL (ref 0.0–0.1)
Basophils Relative: 0 %
Eosinophils Absolute: 0 10*3/uL (ref 0.0–0.5)
Eosinophils Relative: 1 %
HCT: 24 % — ABNORMAL LOW (ref 36.0–46.0)
Hemoglobin: 8.3 g/dL — ABNORMAL LOW (ref 12.0–15.0)
Immature Granulocytes: 0 %
Lymphocytes Relative: 23 %
Lymphs Abs: 0.9 10*3/uL (ref 0.7–4.0)
MCH: 32.5 pg (ref 26.0–34.0)
MCHC: 34.6 g/dL (ref 30.0–36.0)
MCV: 94.1 fL (ref 80.0–100.0)
Monocytes Absolute: 0.5 10*3/uL (ref 0.1–1.0)
Monocytes Relative: 12 %
Neutro Abs: 2.6 10*3/uL (ref 1.7–7.7)
Neutrophils Relative %: 64 %
Platelets: 236 10*3/uL (ref 150–400)
RBC: 2.55 MIL/uL — ABNORMAL LOW (ref 3.87–5.11)
RDW: 15.7 % — ABNORMAL HIGH (ref 11.5–15.5)
WBC: 4 10*3/uL (ref 4.0–10.5)
nRBC: 0 % (ref 0.0–0.2)

## 2021-04-07 LAB — URINALYSIS, ROUTINE W REFLEX MICROSCOPIC
Bilirubin Urine: NEGATIVE
Glucose, UA: NEGATIVE mg/dL
Hgb urine dipstick: NEGATIVE
Ketones, ur: NEGATIVE mg/dL
Leukocytes,Ua: NEGATIVE
Nitrite: NEGATIVE
Protein, ur: NEGATIVE mg/dL
Specific Gravity, Urine: 1.004 — ABNORMAL LOW (ref 1.005–1.030)
pH: 9 — ABNORMAL HIGH (ref 5.0–8.0)

## 2021-04-07 LAB — MAGNESIUM: Magnesium: 1.9 mg/dL (ref 1.7–2.4)

## 2021-04-07 LAB — IRON AND TIBC
Iron: 198 ug/dL — ABNORMAL HIGH (ref 28–170)
Saturation Ratios: 75 % — ABNORMAL HIGH (ref 10.4–31.8)
TIBC: 263 ug/dL (ref 250–450)
UIBC: 65 ug/dL

## 2021-04-07 LAB — FERRITIN: Ferritin: 1033 ng/mL — ABNORMAL HIGH (ref 11–307)

## 2021-04-07 LAB — METHOTREXATE: Methotrexate: 2.07

## 2021-04-07 IMAGING — MR MR HEAD WO/W CM
15 series · 48 of 48 positions shown · IV contrast (gadavist)
Comparison: Comparison made with previous brain MRI from [DATE]
as well as multiple previous exams.

CLINICAL DATA: 65-year-old female with history of diffuse large
cell non-Hodgkin's lymphoma with CNS involvement, now with left
lower extremity spasms. Evaluate for treatment response

EXAM:
MRI HEAD WITHOUT AND WITH CONTRAST
TECHNIQUE: Multiplanar, multiecho pulse sequences of the brain and surrounding
structures were obtained without and with intravenous contrast.
CONTRAST:  7mL GADAVIST GADOBUTROL 1 MMOL/ML IV SOLN

[Series 9: DWI · axial · 3.0mm · 1.36mm/px · z∈[-47,+94]mm · 5 of 96 slices shown (1 of 2)]
[im 1/96]
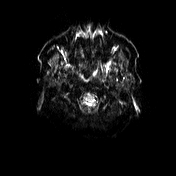
[im 24/96]
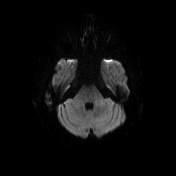
[im 48/96]
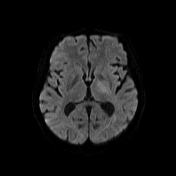
[im 72/96]
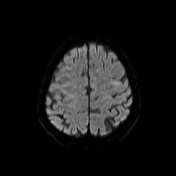
[im 96/96]
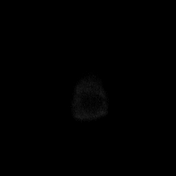

[Series 10: DWI · axial · 3.0mm · 1.36mm/px · z∈[-47,+94]mm · 3 of 48 slices shown (2 of 2)]
[im 1/48]
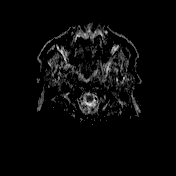
[im 24/48]
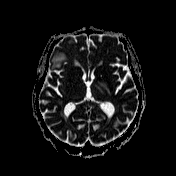
[im 48/48]
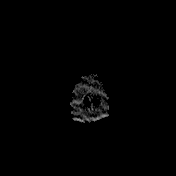

[Series 11: T1 · sagittal · 5.0mm · 0.75mm/px · 1 of 24 slices shown (1 of 4)]
[im 1/24]
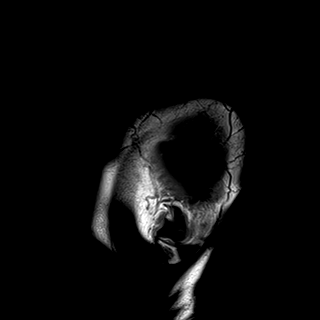

[Series 12: T2 · axial · 5.0mm · 0.62mm/px · z∈[-56,+106]mm · 2 of 26 slices shown]
[im 1/26]
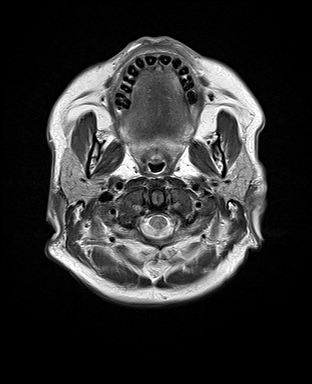
[im 26/26]
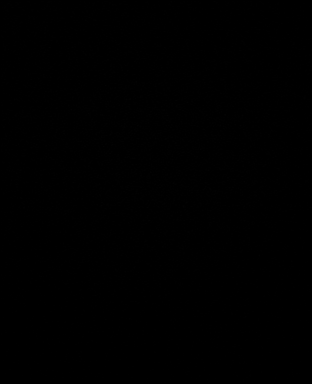

[Series 13: swi_images · axial · 3.0mm · 0.75mm/px · z∈[-57,+107]mm · 3 of 56 slices shown]
[im 1/56]
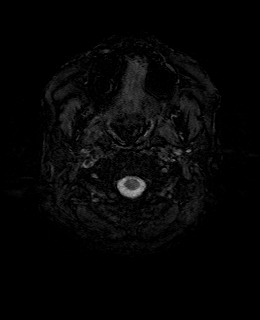
[im 28/56]
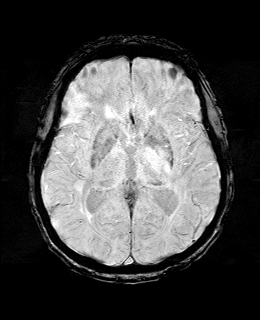
[im 56/56]
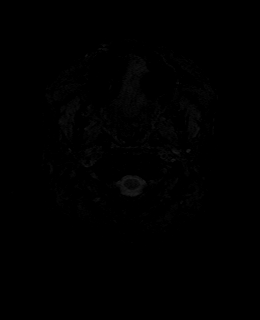

[Series 15: FLAIR · axial · 3.0mm · 0.75mm/px · z∈[-51,+101]mm · 3 of 52 slices shown]
[im 1/52]
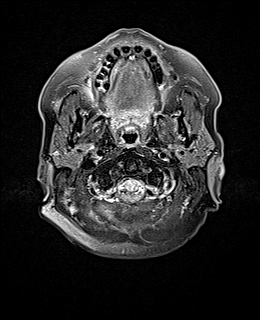
[im 26/52]
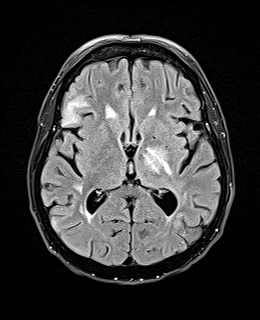
[im 52/52]
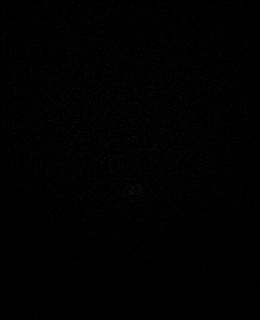

[Series 16: T1 · axial · 1.0mm · 0.94mm/px · z∈[-46,+96]mm · 8 of 144 slices shown (2 of 4)]
[im 1/144]
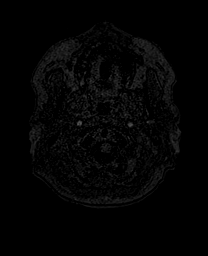
[im 21/144]
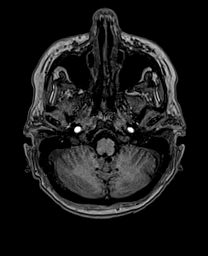
[im 41/144]
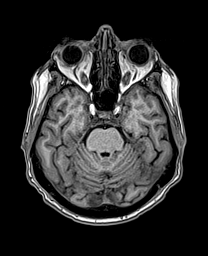
[im 62/144]
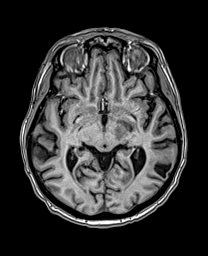
[im 82/144]
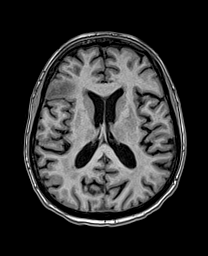
[im 103/144]
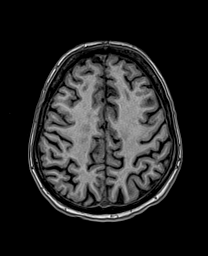
[im 123/144]
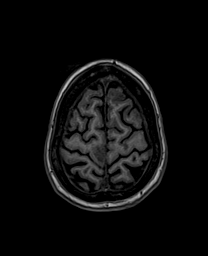
[im 144/144]
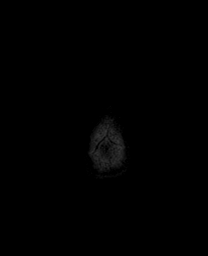

[Series 17: cor dwi_tracew · coronal · 5.0mm · 1.53mm/px · 3 of 56 slices shown]
[im 1/56]
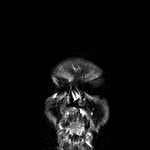
[im 28/56]
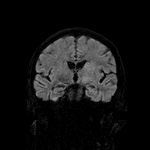
[im 56/56]
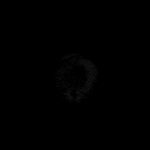

[Series 18: cor dwi_adc · coronal · 5.0mm · 1.53mm/px · 2 of 28 slices shown]
[im 1/28]
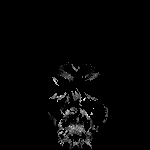
[im 28/28]
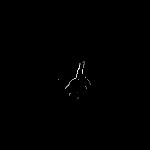

[Series 19: T2 post-contrast · coronal · 5.0mm · 0.57mm/px · 2 of 28 slices shown]
[im 1/28]
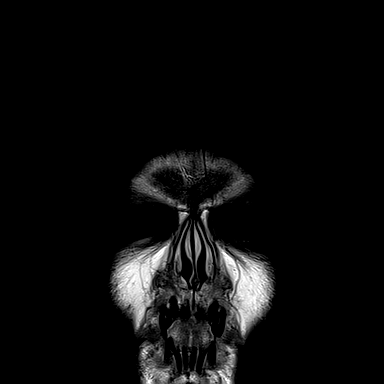
[im 28/28]
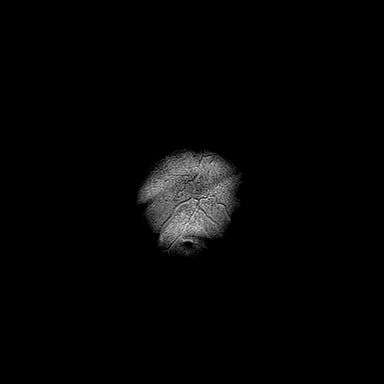

[Series 20: T1 post-contrast · axial · 1.0mm · 0.94mm/px · z∈[-46,+96]mm · 8 of 144 slices shown (1 of 3)]
[im 1/144]
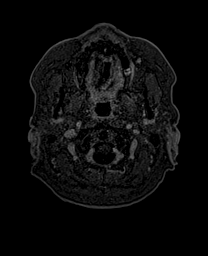
[im 21/144]
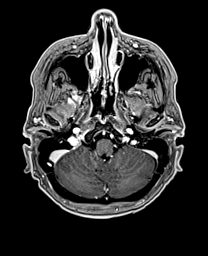
[im 41/144]
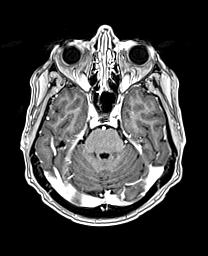
[im 62/144]
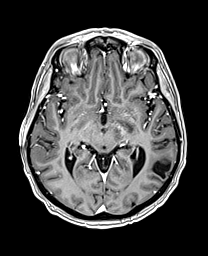
[im 82/144]
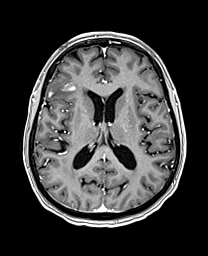
[im 103/144]
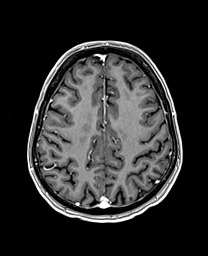
[im 123/144]
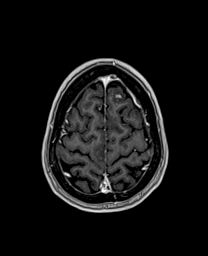
[im 144/144]
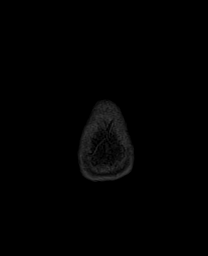

[Series 21: T1 · sagittal · 4.0mm · 0.94mm/px · 2 of 33 slices shown (3 of 4)]
[im 1/33]
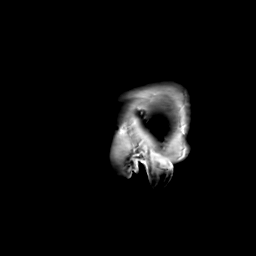
[im 33/33]
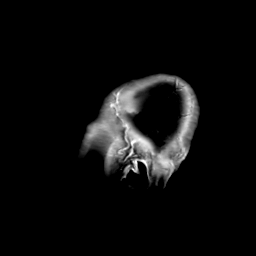

[Series 22: T1 · coronal · 4.0mm · 0.94mm/px · 3 of 45 slices shown (4 of 4)]
[im 1/45]
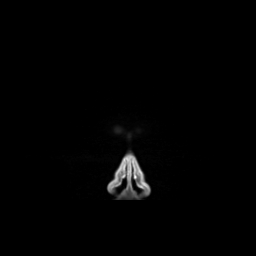
[im 23/45]
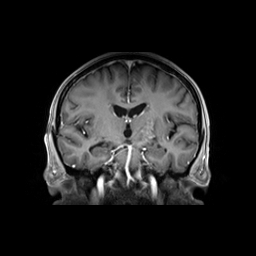
[im 45/45]
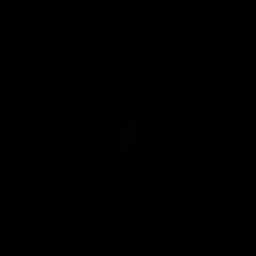

[Series 23: T1 post-contrast · coronal · 5.0mm · 0.43mm/px · 2 of 28 slices shown (2 of 3)]
[im 1/28]
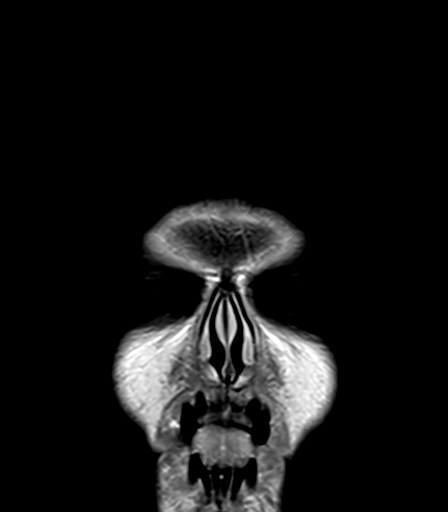
[im 28/28]
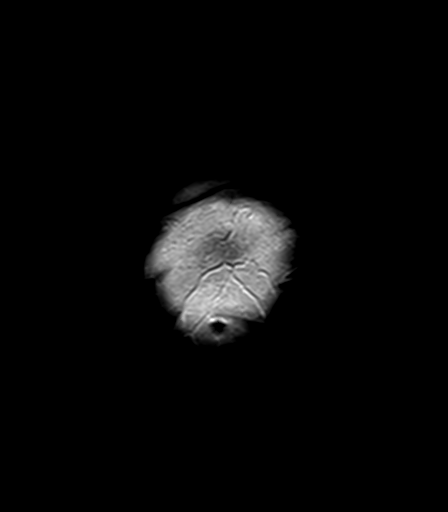

[Series 24: T1 post-contrast · sagittal · 5.0mm · 0.75mm/px · 1 of 24 slices shown (3 of 3)]
[im 1/24]
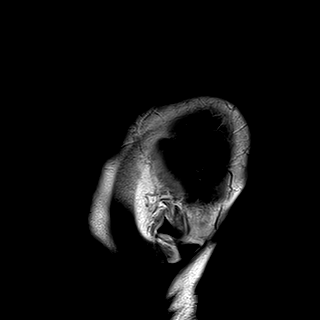

[48 of 48 positions shown; findings below may reference images not displayed]

FINDINGS: Brain: Cerebral volume stable, and remains within normal limits for
age. Scattered T2/FLAIR hyperintensity within the periventricular
white matter noted, stable from prior, and could reflect chronic
microvascular ischemic disease and/or post treatment changes. Few
scattered foci of chronic blood products noted as well, also stable.

No evidence for acute or interval infarction.

Previously identified left posterior temporal lesion again seen.
Associated mild serpiginous enhancement appears mildly decreased
from prior. However, multiple new scattered foci of T2/FLAIR signal
abnormality are seen elsewhere involving the bilateral cerebral
hemispheres. The most prominent focus within the right cerebral
hemisphere involves the cortical/subcortical region at the anterior
right frontal lobe (series 15, image 27). This area now measures up
to approximately 3.2 cm in size, previously 1.2 cm. The most
prominent area of involvement within the left cerebral hemisphere is
seen centered at the posterior limb of the left internal capsule,
extending into the left cerebral peduncle and midbrain (series 15,
images 25, 23). Multiple new and/or increased patchy foci seen
involving the para falcine regions bilaterally (series 15, image
42). Specific note made of patchy involvement at the posterior right
ACA distribution, potentially explaining the patient's left lower
extremity symptoms (series 15, image 42). Associated irregular
patchy post-contrast enhancement seen about several of these areas
(series 20, image 84 for example). Overall, findings are consistent
with interval progression of disease.

Vascular: Major intracranial vascular flow voids are maintained.

Skull and upper cervical spine: Craniocervical junction within
normal limits. Heterogeneous and diffusely decreased T1 signal
intensity seen throughout the visualized bone marrow, consistent
with history of anemia and lymphoproliferative disorder. No focal
marrow replacing lesion. No scalp soft tissue abnormality.

Sinuses/Orbits: Globes orbital soft tissues within normal limits.
Mild scattered mucosal thickening noted throughout the paranasal
sinuses. No air-fluid levels to suggest acute sinusitis. No
significant mastoid effusion.

Other: None.
IMPRESSION: Multiple new and/or increased scattered areas of T2/FLAIR signal
abnormality involving the bilateral cerebral hemispheres as above,
several of which demonstrate irregular postcontrast enhancement.
Findings consistent with interval progression of disease. Specific
note is made of patchy involvement of the posterior right ACA
territory, which could explain the patient's left lower extremity
symptoms.

## 2021-04-07 MED ORDER — BACLOFEN 10 MG PO TABS
10.0000 mg | ORAL_TABLET | Freq: Three times a day (TID) | ORAL | Status: DC
  Administered 2021-04-07 – 2021-04-08 (×4): 10 mg via ORAL
  Filled 2021-04-07 (×4): qty 1

## 2021-04-07 MED ORDER — LEUCOVORIN CALCIUM INJECTION 100 MG
15.0000 mg/m2 | INTRAMUSCULAR | Status: DC
  Administered 2021-04-07 (×3): 28 mg via INTRAVENOUS
  Filled 2021-04-07 (×11): qty 1.4

## 2021-04-07 MED ORDER — SODIUM CHLORIDE 0.9 % IV SOLN
150.0000 mg | Freq: Once | INTRAVENOUS | Status: AC
  Administered 2021-04-07: 150 mg via INTRAVENOUS
  Filled 2021-04-07: qty 5

## 2021-04-07 MED ORDER — GADOBUTROL 1 MMOL/ML IV SOLN
7.0000 mL | Freq: Once | INTRAVENOUS | Status: AC | PRN
  Administered 2021-04-07: 7 mL via INTRAVENOUS

## 2021-04-07 MED ORDER — LEUCOVORIN CALCIUM INJECTION 100 MG: 15.0000 mg/m2 | Freq: Four times a day (QID) | INTRAMUSCULAR | Status: DC

## 2021-04-07 MED ORDER — LEUCOVORIN CALCIUM INJECTION 100 MG
15.0000 mg/m2 | Freq: Four times a day (QID) | INTRAMUSCULAR | Status: AC
  Administered 2021-04-08 (×3): 28 mg via INTRAVENOUS
  Filled 2021-04-07 (×4): qty 1.4

## 2021-04-07 MED ORDER — SODIUM CHLORIDE 0.9 % IV SOLN
Freq: Three times a day (TID) | INTRAVENOUS | Status: AC
  Administered 2021-04-07 – 2021-04-09 (×6): 16 mg via INTRAVENOUS
  Filled 2021-04-07 (×7): qty 8

## 2021-04-07 MED ORDER — POTASSIUM CHLORIDE 10 MEQ/50ML IV SOLN
10.0000 meq | INTRAVENOUS | Status: AC
  Administered 2021-04-07 (×4): 10 meq via INTRAVENOUS
  Filled 2021-04-07 (×4): qty 50

## 2021-04-07 NOTE — Care Management Important Message (Signed)
Important Message  Patient Details IM Letter given to the Patient. Name: Ariel [name] MRN: [id number] Date of Birth: August 24, 1955   Medicare Important Message Given:  Yes     [name] 04/07/2021, 10:08 AM

## 2021-04-07 NOTE — Progress Notes (Signed)
Ariel [name] completed her chemotherapy yesterday.  We gave the methotrexate over 3 hours..  She is having some spasms in the left leg.  I am not sure as to why this is happening.  We will check a magnesium level on her.  Potassium is 3.0.  I will give her some extra potassium today.  I do worry about the CNS lymphoma.  Hopefully, this is not progressing.  I will put her on some baclofen to try to help with these spasms.  Patient having little more in the way of nausea.  I will put her on some scheduled Zofran for right now.  Again, we cannot use Decadron because of the mental status changes that she has with Decadron.  I will put her on a dose of Emend.  Her hemoglobin is down to 8.3.  Hemoglobin is 4.  Platelet count 236,000.  We will have to watch her blood counts closely.  Her BUN is 10 creatinine 0.71.  Calcium is 8.4.  Albumin is 3.  She has had no mouth sores.  There is no visual changes.  There is no complaint of headaches.  She does not have any issues with bowels or bladder.  On exam, her vital signs are temperature 98.6.  Pulse 83.  Blood pressure 115/58.  Her head neck exam shows no ocular or oral lesions.  She has no adenopathy in the neck.  Lungs are clear.  Cardiac exam regular rate and rhythm.  Abdomen is soft.  Bowel sounds are present.  Strongly does show this spasm in the left leg.  Right leg is unremarkable.  There is really no weakness in the left leg.  There is no issues with her upper extremities.  Neurological exam shows no focal neurological deficits.  Again, Ariel [name] has this spasm in the left leg.  I do have to worry about what is going on in the brain.  If the spasm does not abate with increased potassium and with the baclofen, we may have to do an MRI of the brain to see what is going on.  I cannot find any neurological issues that would suggest progressive disease in the brain although again, this is a possibility.  I know that she will get the leucovorin rescue.   Pharmacy is doing a great job with managing methotrexate levels for [place].  We will see what the level is today.  We will have to watch her hemoglobin closely.  If her hemoglobin drops more, then we may have to think about transfusing her.  She clearly is not ready for any kind of discharge.  I do appreciate the incredible care that she is getting from all the staff upon [address].  [name], MD  Jeneen Rinks 1:5-7

## 2021-04-08 ENCOUNTER — Encounter: Payer: Self-pay | Admitting: *Deleted

## 2021-04-08 ENCOUNTER — Other Ambulatory Visit: Payer: Self-pay | Admitting: Radiation Therapy

## 2021-04-08 DIAGNOSIS — M79605 Pain in left leg: Secondary | ICD-10-CM | POA: Diagnosis not present

## 2021-04-08 DIAGNOSIS — E876 Hypokalemia: Secondary | ICD-10-CM | POA: Diagnosis not present

## 2021-04-08 DIAGNOSIS — C8333 Diffuse large B-cell lymphoma, intra-abdominal lymph nodes: Secondary | ICD-10-CM | POA: Diagnosis not present

## 2021-04-08 DIAGNOSIS — Z79899 Other long term (current) drug therapy: Secondary | ICD-10-CM | POA: Diagnosis not present

## 2021-04-08 LAB — COMPREHENSIVE METABOLIC PANEL
ALT: 54 U/L — ABNORMAL HIGH (ref 0–44)
AST: 55 U/L — ABNORMAL HIGH (ref 15–41)
Albumin: 3.2 g/dL — ABNORMAL LOW (ref 3.5–5.0)
Alkaline Phosphatase: 56 U/L (ref 38–126)
Anion gap: 5 (ref 5–15)
BUN: 8 mg/dL (ref 8–23)
CO2: 33 mmol/L — ABNORMAL HIGH (ref 22–32)
Calcium: 8.8 mg/dL — ABNORMAL LOW (ref 8.9–10.3)
Chloride: 103 mmol/L (ref 98–111)
Creatinine, Ser: 0.72 mg/dL (ref 0.44–1.00)
GFR, Estimated: >60 mL/min (ref >60)
Glucose, Bld: 116 mg/dL — ABNORMAL HIGH (ref 70–99)
Potassium: 3.3 mmol/L — ABNORMAL LOW (ref 3.5–5.1)
Sodium: 141 mmol/L (ref 135–145)
Total Bilirubin: 0.4 mg/dL (ref 0.3–1.2)
Total Protein: 5.4 g/dL — ABNORMAL LOW (ref 6.5–8.1)

## 2021-04-08 LAB — URINALYSIS, ROUTINE W REFLEX MICROSCOPIC
Bilirubin Urine: NEGATIVE
Glucose, UA: NEGATIVE mg/dL
Hgb urine dipstick: NEGATIVE
Ketones, ur: NEGATIVE mg/dL
Leukocytes,Ua: NEGATIVE
Nitrite: NEGATIVE
Protein, ur: NEGATIVE mg/dL
Specific Gravity, Urine: 1.005 (ref 1.005–1.030)
pH: 9 — ABNORMAL HIGH (ref 5.0–8.0)

## 2021-04-08 LAB — CBC
HCT: 25.1 % — ABNORMAL LOW (ref 36.0–46.0)
Hemoglobin: 8.6 g/dL — ABNORMAL LOW (ref 12.0–15.0)
MCH: 32.7 pg (ref 26.0–34.0)
MCHC: 34.3 g/dL (ref 30.0–36.0)
MCV: 95.4 fL (ref 80.0–100.0)
Platelets: 215 10*3/uL (ref 150–400)
RBC: 2.63 MIL/uL — ABNORMAL LOW (ref 3.87–5.11)
RDW: 15.4 % (ref 11.5–15.5)
WBC: 2.6 10*3/uL — ABNORMAL LOW (ref 4.0–10.5)
nRBC: 0 % (ref 0.0–0.2)

## 2021-04-08 LAB — METHOTREXATE: Methotrexate: 0.4

## 2021-04-08 MED ORDER — POTASSIUM CHLORIDE 10 MEQ/50ML IV SOLN
10.0000 meq | INTRAVENOUS | Status: AC
  Administered 2021-04-08 (×4): 10 meq via INTRAVENOUS
  Filled 2021-04-08 (×4): qty 50

## 2021-04-08 MED ORDER — BACLOFEN 10 MG PO TABS
10.0000 mg | ORAL_TABLET | Freq: Three times a day (TID) | ORAL | Status: DC | PRN
  Administered 2021-04-09 (×2): 10 mg via ORAL
  Filled 2021-04-08 (×2): qty 1

## 2021-04-08 MED ORDER — LEUCOVORIN CALCIUM INJECTION 100 MG
15.0000 mg/m2 | Freq: Four times a day (QID) | INTRAMUSCULAR | Status: AC
  Administered 2021-04-08 – 2021-04-09 (×4): 28 mg via INTRAVENOUS
  Filled 2021-04-08 (×6): qty 1.4

## 2021-04-08 MED ORDER — TRAMADOL HCL 50 MG PO TABS
50.0000 mg | ORAL_TABLET | Freq: Four times a day (QID) | ORAL | Status: DC | PRN
  Administered 2021-04-08 – 2021-04-14 (×11): 50 mg via ORAL
  Filled 2021-04-08 (×11): qty 1

## 2021-04-08 NOTE — Progress Notes (Addendum)
Unfortunately, the MRI shows that she is already progressing.  She has multiple areas of enhancement.  Surprisingly, her left leg is doing much better.  I do not know if the baclofen may have helped.  I do not know if the potassium that she got may have helped.  This is going be a very tough problem for [place] now.  She has multiple areas of activity in the brain.  Again, she is being very aggressive therapy for intracranial lymphoma.  The fact that she is beginning to progress already is certainly a very ominous sign.  I will have to speak with Radiation Oncology to see if they may be able to help [place].  The problem with this is that she is just not tolerant of steroids.  If she goes on steroids to help with CNS edema, she could certainly run to problems with altered mental state.  Another option might be to consider high-dose Ara-C.  I know that stem cell transplants have been used for CNS lymphoma.  However, I would think this would only be considered if she was actually in remission.  I am not sure if CAR-T therapy has been utilized in this situation.  I have to believe that systemically, everything is going well.  Her potassium is still a little bit on the low side.  Is 3.3.  I will give her some oral potassium today.  Her LFTs are little bit elevated which is no surprise.  Her renal function is doing great 0.7-4 creatinine.  Her CBC shows white count 2.6.  Hemoglobin 8.6.  Platelet count 215,000.  We will still hold off on transfusing her.  I do appreciate pharmacy's help in managing the methotrexate.  She is on leucovorin rescue.  I think her methotrexate level yesterday was 2.5.  She is not as nauseated.  She is on a scheduled Zofran administration.  Her vital signs show a temperature of 98.8.  Pulse 72.  Blood pressure 118/66.  Her head neck exam shows no ocular or oral lesions.  She has no mucositis.  There is no adenopathy.  Lungs are clear.  Cardiac exam regular rate and rhythm.  Abdomen  soft.  Bowel sounds are present.  There is no guarding or rebound tenderness.  Strongly shows no spasms in the leg.  She has good strength in upper and lower extremities.  Neurological exam shows no focal neurological deficits.  Again, I am just very disappointed by the MRI report.  Again, she is done a great job.  We have given her aggressive therapy.  May be, continued high-dose methotrexate will help.  I know she has only had 1 cycle to date.  Again I want to speak with Radiation Oncology.  I know that she is got incredible care from the wonderful staff up on [address].  Of note, her [birth year] is next [birth year].  I want her to be able to be home and enjoy this.   [name], MD  Psalm 27:14

## 2021-04-08 NOTE — Progress Notes (Signed)
Pharmacy Brief Note - MTX Monitoring:  -MTX level = 0.40 -Urine pH = 9, SCr WNL and stable.  MTX result reported to Dr. Marin Olp who would like to continue with current dose of leucovorin 15 mg/m2 (28 mg) IV q6h. Orders released for the next 24 hrs.   Next MTX level ordered with AM labs tomorrow.  Lenis Noon, PharmD 04/08/21 1:29 PM

## 2021-04-08 NOTE — Progress Notes (Signed)
Patient continues to be admitted for chemo. She developed leg spasms and had an MRI which shows progressive CNS disease. Dr Marin Olp is reaching out to Radiation Oncology.   Will continue to follow for post discharge needs and follow up.   Oncology Nurse Navigator Documentation  Oncology Nurse Navigator Flowsheets 04/08/2021  Abnormal Finding Date -  Confirmed Diagnosis Date -  Phase of Treatment -  Chemotherapy Actual Start Date: -  Navigator Follow Up Date: 04/15/2021  Navigator Follow Up Reason: Appointment Review  Navigator Location [facility]  Navigator Encounter Type Appt/Treatment Plan Review  Telephone -  Treatment Initiated Date -  Patient Visit Type MedOnc  Treatment Phase Active Tx  Barriers/Navigation Needs Coordination of Care;Education  Education -  Interventions None Required  Acuity Level 2-Minimal Needs (1-2 Barriers Identified)  Referrals -  Coordination of Care -  Education Method -  Support Groups/Services Friends and Family  Time Spent with Patient 15

## 2021-04-09 ENCOUNTER — Encounter: Payer: Self-pay | Admitting: Hematology & Oncology

## 2021-04-09 ENCOUNTER — Other Ambulatory Visit: Payer: Self-pay | Admitting: Hematology and Oncology

## 2021-04-09 DIAGNOSIS — M79605 Pain in left leg: Secondary | ICD-10-CM | POA: Diagnosis not present

## 2021-04-09 DIAGNOSIS — Z79899 Other long term (current) drug therapy: Secondary | ICD-10-CM | POA: Diagnosis not present

## 2021-04-09 DIAGNOSIS — E876 Hypokalemia: Secondary | ICD-10-CM | POA: Diagnosis not present

## 2021-04-09 DIAGNOSIS — C8333 Diffuse large B-cell lymphoma, intra-abdominal lymph nodes: Secondary | ICD-10-CM | POA: Diagnosis not present

## 2021-04-09 LAB — COMPREHENSIVE METABOLIC PANEL
ALT: 107 U/L — ABNORMAL HIGH (ref 0–44)
AST: 86 U/L — ABNORMAL HIGH (ref 15–41)
Albumin: 3.6 g/dL (ref 3.5–5.0)
Alkaline Phosphatase: 65 U/L (ref 38–126)
Anion gap: 8 (ref 5–15)
BUN: 9 mg/dL (ref 8–23)
CO2: 30 mmol/L (ref 22–32)
Calcium: 9.3 mg/dL (ref 8.9–10.3)
Chloride: 98 mmol/L (ref 98–111)
Creatinine, Ser: 0.67 mg/dL (ref 0.44–1.00)
GFR, Estimated: >60 mL/min (ref >60)
Glucose, Bld: 153 mg/dL — ABNORMAL HIGH (ref 70–99)
Potassium: 3.2 mmol/L — ABNORMAL LOW (ref 3.5–5.1)
Sodium: 136 mmol/L (ref 135–145)
Total Bilirubin: 0.5 mg/dL (ref 0.3–1.2)
Total Protein: 6.5 g/dL (ref 6.5–8.1)

## 2021-04-09 LAB — CBC
HCT: 29.4 % — ABNORMAL LOW (ref 36.0–46.0)
Hemoglobin: 9.9 g/dL — ABNORMAL LOW (ref 12.0–15.0)
MCH: 32 pg (ref 26.0–34.0)
MCHC: 33.7 g/dL (ref 30.0–36.0)
MCV: 95.1 fL (ref 80.0–100.0)
Platelets: 276 10*3/uL (ref 150–400)
RBC: 3.09 MIL/uL — ABNORMAL LOW (ref 3.87–5.11)
RDW: 14.6 % (ref 11.5–15.5)
WBC: 6 10*3/uL (ref 4.0–10.5)
nRBC: 0 % (ref 0.0–0.2)

## 2021-04-09 LAB — URINALYSIS, ROUTINE W REFLEX MICROSCOPIC
Bilirubin Urine: NEGATIVE
Glucose, UA: NEGATIVE mg/dL
Hgb urine dipstick: NEGATIVE
Ketones, ur: NEGATIVE mg/dL
Leukocytes,Ua: NEGATIVE
Nitrite: NEGATIVE
Protein, ur: NEGATIVE mg/dL
Specific Gravity, Urine: 1.005 (ref 1.005–1.030)
pH: 9 — ABNORMAL HIGH (ref 5.0–8.0)

## 2021-04-09 MED ORDER — POTASSIUM CHLORIDE 10 MEQ/50ML IV SOLN
10.0000 meq | INTRAVENOUS | Status: AC
  Administered 2021-04-09 (×4): 10 meq via INTRAVENOUS
  Filled 2021-04-09 (×5): qty 50

## 2021-04-09 MED ORDER — LEUCOVORIN CALCIUM INJECTION 100 MG
15.0000 mg/m2 | Freq: Three times a day (TID) | INTRAMUSCULAR | Status: DC
  Administered 2021-04-09 – 2021-04-10 (×2): 28 mg via INTRAVENOUS
  Filled 2021-04-09 (×5): qty 1.4

## 2021-04-09 NOTE — Progress Notes (Addendum)
Pharmacy Brief Note - MTX Monitoring: Spoke with Zack at [facility] laboratory for MTX Value:   -MTX level = 0.13 -Urine pH = 9, SCr WNL and stable.  MTX result reported to Dr. Marin Olp who would like for patient to receive leucovorin 15 mg/m2 (28 mg) IV q8h.   Next MTX level ordered with AM labs tomorrow.  Dimple Nanas, PharmD 04/09/2021 1:27 PM

## 2021-04-09 NOTE — Progress Notes (Addendum)
Ms. Ringgold is feeling okay this morning.  She is having some back discomfort yesterday.  She has had this in the past.  I am unsure as to why she had it.  We put her on a little tramadol.  This did seem to help.  Her methotrexate levels have been coming down quite nicely.  Yesterday the methotrexate level was 0.4.  She is still on the leucovorin.  We will see what the level is today.  She is having no neurological issues.  There is no tremor in the left leg.  She is having no problems with nausea or vomiting.  She says she will eat more today.  She has more of an appetite.  Her BUN is 9 creatinine 0.67.  The SGPT is 107.  SGOT 86.  The elevated liver function studies are from the methotrexate.  The potassium is 3.2.  I will continue to give her some IV potassium.  Her white cell count is 6.  Hemoglobin 9.9.  Her platelet count is 276,000.  1 option that we may have with respect to the progressive CNS disease might be to utilize the MATRix protocol.  This basically adds high-dose Ara-C to high-dose methotrexate.  I know that Radiation Oncology will see her I think on Monday or Tuesday.  She has had no mouth sores.  She has had no diarrhea.  There is no cough or shortness of breath.  Her vital signs show temperature of 98.3.  Pulse 91.  Blood pressure 116/45.  Oral exam does not show any mucositis.  Pupils react appropriately.  There is no adenopathy in the neck.  Lungs are clear.  She has good air movement bilaterally.  Cardiac exam regular rate and rhythm.  Abdomen is soft.  Bowel sounds are present.  There is no distention.  Extremity shows no clubbing, cyanosis or edema.  Neurological exam shows no focal neurological deficits.  We will see what the methotrexate level is.  Maybe, should be able to have the leucovorin DC'd over the weekend.  Again, there is no neurological issues so far from the CNS progression of the lymphoma.  She does not have bulky disease.  This is a blessing.  I know  that Dr. Lindi Adie will see her tomorrow.  I do appreciate his wonderful help.  I also appreciate the incredible care she is getting from the staff up on [address].  [name], MD  Darlyn Chamber 17:14

## 2021-04-10 ENCOUNTER — Encounter: Payer: Self-pay | Admitting: Hematology & Oncology

## 2021-04-10 DIAGNOSIS — C8333 Diffuse large B-cell lymphoma, intra-abdominal lymph nodes: Secondary | ICD-10-CM | POA: Diagnosis not present

## 2021-04-10 LAB — URINALYSIS, ROUTINE W REFLEX MICROSCOPIC
Bilirubin Urine: NEGATIVE
Glucose, UA: NEGATIVE mg/dL
Hgb urine dipstick: NEGATIVE
Ketones, ur: NEGATIVE mg/dL
Leukocytes,Ua: NEGATIVE
Nitrite: NEGATIVE
Protein, ur: 30 mg/dL — AB
Specific Gravity, Urine: 1.009 (ref 1.005–1.030)
pH: 9 — ABNORMAL HIGH (ref 5.0–8.0)

## 2021-04-10 LAB — CBC
HCT: 27 % — ABNORMAL LOW (ref 36.0–46.0)
Hemoglobin: 9.1 g/dL — ABNORMAL LOW (ref 12.0–15.0)
MCH: 32.2 pg (ref 26.0–34.0)
MCHC: 33.7 g/dL (ref 30.0–36.0)
MCV: 95.4 fL (ref 80.0–100.0)
Platelets: 220 10*3/uL (ref 150–400)
RBC: 2.83 MIL/uL — ABNORMAL LOW (ref 3.87–5.11)
RDW: 14.4 % (ref 11.5–15.5)
WBC: 6.5 10*3/uL (ref 4.0–10.5)
nRBC: 0 % (ref 0.0–0.2)

## 2021-04-10 LAB — COMPREHENSIVE METABOLIC PANEL
ALT: 89 U/L — ABNORMAL HIGH (ref 0–44)
AST: 50 U/L — ABNORMAL HIGH (ref 15–41)
Albumin: 3.3 g/dL — ABNORMAL LOW (ref 3.5–5.0)
Alkaline Phosphatase: 57 U/L (ref 38–126)
Anion gap: 7 (ref 5–15)
BUN: 9 mg/dL (ref 8–23)
CO2: 32 mmol/L (ref 22–32)
Calcium: 8.8 mg/dL — ABNORMAL LOW (ref 8.9–10.3)
Chloride: 94 mmol/L — ABNORMAL LOW (ref 98–111)
Creatinine, Ser: 0.64 mg/dL (ref 0.44–1.00)
GFR, Estimated: >60 mL/min (ref >60)
Glucose, Bld: 141 mg/dL — ABNORMAL HIGH (ref 70–99)
Potassium: 3.1 mmol/L — ABNORMAL LOW (ref 3.5–5.1)
Sodium: 133 mmol/L — ABNORMAL LOW (ref 135–145)
Total Bilirubin: 0.4 mg/dL (ref 0.3–1.2)
Total Protein: 6.2 g/dL — ABNORMAL LOW (ref 6.5–8.1)

## 2021-04-10 LAB — MAGNESIUM: Magnesium: 1.9 mg/dL (ref 1.7–2.4)

## 2021-04-10 MED ORDER — POTASSIUM CHLORIDE 10 MEQ/50ML IV SOLN
10.0000 meq | INTRAVENOUS | Status: AC
  Administered 2021-04-10 (×4): 10 meq via INTRAVENOUS
  Filled 2021-04-10 (×4): qty 50

## 2021-04-10 MED ORDER — SODIUM BICARBONATE 8.4 % IV SOLN
INTRAVENOUS | Status: DC
  Filled 2021-04-10: qty 1000
  Filled 2021-04-10: qty 150
  Filled 2021-04-10: qty 1000

## 2021-04-10 NOTE — Progress Notes (Signed)
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  It came on suddenly without any predisposing incident.  She tells me that she had similar reactions before where her legs would give up but the pain in the knee is slightly different this time.  She is not able to get up from the bed or even put weight on the left knee.  She has been receiving potassium through intravenous route.  She has not been eating much and has not had a bowel movement in couple of days.   ALLERGIES:  is allergic to codeine, decadron [dexamethasone], plavix [clopidogrel], and prednisone.  MEDICATIONS:  Current Facility-Administered Medications  Medication Dose Route Frequency Provider Last Rate Last Admin   acyclovir (ZOVIRAX) 200 MG capsule 200 mg  200 mg Oral BID Mikey Bussing R, NP   200 mg at 04/10/21 0829   antiseptic oral rinse (BIOTENE) solution 15 mL  15 mL Mouth Rinse Q4H Curcio, Kristin R, NP   15 mL at 04/09/21 0539   baclofen (LIORESAL) tablet 10 mg  10 mg Oral TID PRN Volanda Napoleon, MD   10 mg at 04/09/21 2009   Chlorhexidine Gluconate Cloth 2 % PADS 6 each  6 each Topical Daily Volanda Napoleon, MD   6 each at 04/10/21 0830   docusate sodium (COLACE) capsule 100 mg  100 mg Oral BID Mikey Bussing R, NP   100 mg at 04/10/21 0828   enoxaparin (LOVENOX) injection 40 mg  40 mg Subcutaneous Daily Volanda Napoleon, MD   40 mg at 04/10/21 0830   leucovorin injection 28 mg  15 mg/m2 (Treatment Plan  Recorded) Intravenous Q8H Volanda Napoleon, MD   28 mg at 04/10/21 0524   sodium bicarbonate 150 mEq in dextrose 5 % 1,150 mL infusion   Intravenous Continuous Volanda Napoleon, MD 150 mL/hr at 04/10/21 0523 New Bag at 04/10/21 0523   sodium bicarbonate/sodium chloride mouthwash [id number]  30 application Mouth Rinse Q4H Curcio, Roselie Awkward, NP   30 application at 19/14/78 0831   traMADol (ULTRAM) tablet 50 mg  50 mg Oral Q6H PRN Volanda Napoleon, MD   50 mg at 04/10/21 0829    PHYSICAL EXAMINATION: ECOG PERFORMANCE STATUS: 3 - Symptomatic, >50% confined to bed  Vitals:   04/09/21 2019 04/10/21 0532  BP: 125/61 131/61  Pulse: 86 86  Resp: 18 18  Temp: 99.8 F (37.7 C) 99.4 F (37.4 C)  SpO2: 93% 94%   Filed Weights   04/08/21 0446 04/09/21 0517 04/10/21 0532  Weight: 157 lb 10.1 oz (71.5 kg) 158 lb 1.1 oz (71.7 kg) 160 lb 7.9 oz (72.8 kg)   LABORATORY DATA:  I have reviewed the data as listed CMP Latest Ref Rng & Units 04/10/2021 04/09/2021 04/08/2021  Glucose 70 - 99 mg/dL 141(H) 153(H) 116(H)  BUN 8 - 23 mg/dL 9 9 8   Creatinine 0.44 - 1.00 mg/dL 0.64 0.67 0.72  Sodium 135 - 145 mmol/L 133(L) 136 141  Potassium 3.5 - 5.1 mmol/L 3.1(L) 3.2(L) 3.3(L)  Chloride 98 - 111 mmol/L 94(L) 98 103  CO2 22 - 32 mmol/L 32 30 33(H)  Calcium 8.9 - 10.3 mg/dL 8.8(L) 9.3 8.8(L)  Total Protein 6.5 - 8.1 g/dL 6.2(L) 6.5 5.4(L)  Total Bilirubin 0.3 - 1.2 mg/dL 0.4 0.5 0.4  Alkaline Phos 38 - 126 U/L 57 65 56  AST 15 - 41 U/L 50(H) 86(H) 55(H)  ALT 0 - 44 U/L 89(H) 107(H) 54(H)    Lab Results  Component Value Date   WBC 6.5 04/10/2021   HGB 9.1 (L) 04/10/2021   HCT 27.0 (L) 04/10/2021   MCV 95.4 04/10/2021   PLT 220 04/10/2021   NEUTROABS 2.6 04/07/2021    ASSESSMENT & PLAN:  Diffuse large cell non-Hodgkin's lymphoma with CNS involvement: Currently receiving systemic chemotherapy with R-ICE/R-MTV.  After the high-dose methotrexate she is currently receiving leucovorin.  The dose has been  adjusted to every 8 hours.  We do not have methotrexate levels from yesterday or today.  The levels are coming down quite well. Severe left knee pain: No trauma or fall or any other predisposing factor.  We are to assume it is an adverse effect of chemotherapy.  Other possibility is electrolyte imbalance.  We can check magnesium levels and replace it if needed.  She is also getting potassium replacement. Methotrexate levels have been coming down gradually.  Dr. Marin Olp will make decision regarding disposition based on her methotrexate levels. Anemia due to chemotherapy: Being observed     Orders Placed This Encounter  Procedures   X-ray chest PA and lateral    Standing Status:   Standing    Number of Occurrences:   1    Order Specific Question:   Symptom/Reason for Exam    Answer:   Diffuse large B cell lymphoma ([place]) [[id number]]   MR BRAIN W WO CONTRAST    On her last MRI, there was a new 3 mm spot in her brain.  I need to see if this is worse and might be causing her problem.    Standing Status:   Standing    Number of Occurrences:   1    Order Specific Question:   If indicated for the ordered procedure, I authorize the administration of contrast media per Radiology protocol    Answer:   Yes    Order Specific Question:   What is the patient's sedation requirement?    Answer:   No Sedation    Order Specific Question:   Does the patient have a pacemaker or implanted devices?    Answer:   No    Order Specific Question:   Use SRS Protocol?    Answer:   No   Creatinine, serum    while on enoxaparin therapy    Standing Status:   Standing    Number of Occurrences:   3   Comprehensive metabolic panel    Standing Status:   Standing    Number of Occurrences:   1   Magnesium    Standing Status:   Standing    Number of Occurrences:   1   Phosphorus    Standing Status:   Standing    Number of Occurrences:   1   CBC WITH DIFFERENTIAL    Standing Status:   Standing    Number of Occurrences:    1   Urinalysis, Routine w reflex microscopic Urine, Clean Catch    Standing Status:   Standing    Number of Occurrences:   1  Comprehensive metabolic panel    Standing Status:   Standing    Number of Occurrences:   1   CBC    Standing Status:   Standing    Number of Occurrences:   1   Urinalysis, Routine w reflex microscopic Urine, Clean Catch    Standing Status:   Standing    Number of Occurrences:   17   CBC with Differential/Platelet    Standing Status:   Standing    Number of Occurrences:   1   Comprehensive metabolic panel    Standing Status:   Standing    Number of Occurrences:   1   Methotrexate (STAT)    Lab needs to be drawn at 12:30 pm on 04/07/21 and sent to [facility] STAT after being drawn    Standing Status:   Standing    Number of Occurrences:   1   Methotrexate (STAT)    Please sent lab to [facility] STAT after drawn each morning    Standing Status:   Standing    Number of Occurrences:   17   Ferritin    Standing Status:   Standing    Number of Occurrences:   1   Iron and TIBC    Standing Status:   Standing    Number of Occurrences:   1   CBC    Baseline for enoxaparin therapy IF NOT ALREADY DRAWN.  Notify MD if PLT < 100 K.    Standing Status:   Standing    Number of Occurrences:   5   Comprehensive metabolic panel    Standing Status:   Standing    Number of Occurrences:   5   Magnesium    Standing Status:   Standing    Number of Occurrences:   1   Diet regular Room service appropriate? Yes; Fluid consistency: Thin    Standing Status:   Standing    Number of Occurrences:   1    Order Specific Question:   Room service appropriate?    Answer:   Yes    Order Specific Question:   Fluid consistency:    Answer:   Thin   Vital signs    Standing Status:   Standing    Number of Occurrences:   1   Notify physician (specify)    Standing Status:   Standing    Number of Occurrences:   20    Order Specific Question:   Notify Physician    Answer:   for pulse less than  55 or greater than 120    Order Specific Question:   Notify Physician    Answer:   for respiratory rate less than 12 or greater than 25    Order Specific Question:   Notify Physician    Answer:   for temperature greater than 100.5 F    Order Specific Question:   Notify Physician    Answer:   for urinary output less than 30 mL/hr for four hours    Order Specific Question:   Notify Physician    Answer:   for systolic BP less than 90 or greater than 161, diastolic BP less than 60 or greater than 100    Order Specific Question:   Notify Physician    Answer:   for new hypoxia w/ oxygen saturations < 88%   Progressive Mobility Protocol: No Restrictions    Standing Status:   Standing    Number of Occurrences:   1  Initiate Oral Care Protocol    Standing Status:   Standing    Number of Occurrences:   1   Initiate Carrier Fluid Protocol    Standing Status:   Standing    Number of Occurrences:   1   RN may order General Admission PRN Orders utilizing "General Admission PRN medications" (through manage orders) for the following patient needs: allergy symptoms (Claritin), cold sores (Carmex), cough (Robitussin DM), eye irritation (Liquifilm Tears), hemorrhoids (Tucks), indigestion (Maalox), minor skin irritation (Hydrocortisone Cream), muscle pain Suezanne Jacquet Gay), nose irritation (saline nasal spray) and sore throat (Chloraseptic spray).    Standing Status:   Standing    Number of Occurrences:   [phone]   Informed Consent Details: Physician/Practitioner Attestation; Transcribe to consent form and obtain patient signature    Standing Status:   Standing    Number of Occurrences:   1    Order Specific Question:   Physician/Practitioner attestation of informed consent for blood and or blood product transfusion    Answer:   I, the physician/practitioner, attest that I have discussed with the patient the benefits, risks, side effects, alternatives, likelihood of achieving goals and potential problems during  recovery for the procedure that I have provided informed consent.    Order Specific Question:   Product(s)    Answer:   All Product(s)   Intake and Output    Standing Status:   Standing    Number of Occurrences:   1   Daily weights    Standing Status:   Standing    Number of Occurrences:   1   Care order/instruction: Start chemo today    Start chemo today    Standing Status:   Standing    Number of Occurrences:   1   Refer to Sidebar Report - CHG cloths Sidebar    - CHG cloths Sidebar    Standing Status:   Standing    Number of Occurrences:   1   Patient education (specify): - [facility] Daily CHG Bathing    Standing Status:   Standing    Number of Occurrences:   1    Order Specific Question:   Specify    Answer:   - [facility] Daily CHG Bathing   ONCOLOGY IP TX CONDITIONS    Notify the MD for the following lab values: ANC < 1500, PLT < 100K, Hemoglobin < 8.5, Creatinine > 1.5. If labs are abnormal OR no lab data is available MD must be notified and order obtained to begin chemotherapy.     Standing Status:   Standing    Number of Occurrences:   1   PHYSICIAN COMMUNICATION ORDER    Order methotrexate levels daily following completion of methotrexate infusion until levels are <0.05 uM/L.    Standing Status:   Standing    Number of Occurrences:   1   Full code    Standing Status:   Standing    Number of Occurrences:   1   chemotherapy pharmacy monitoring    Standing Status:   Standing    Number of Occurrences:   1   Admit to Inpatient (patient's expected length of stay will be greater than 2 midnights or inpatient only procedure)    Standing Status:   Standing    Number of Occurrences:   1    Order Specific Question:   Hospital Area    Answer:   [facility] [[id number]]    Order Specific Question:   Level  of Care    Answer:   Med-Surg [16]    Order Specific Question:   Covid Evaluation    Answer:   Asymptomatic Screening Protocol (No Symptoms)    Order Specific  Question:   Diagnosis    Answer:   Diffuse large B cell lymphoma ([place]) [[id number]]    Order Specific Question:   Admitting Physician    Answer:   Volanda Napoleon [[id number]]    Order Specific Question:   Attending Physician    Answer:   Volanda Napoleon [[id number]]    Order Specific Question:   Estimated length of stay    Answer:   5 - 7 days    Order Specific Question:   Certification:    Answer:   I certify this patient will need inpatient services for at least 2 midnights   The patient has a good understanding of the overall plan. she agrees with it. she will call with any problems that may develop before the next visit here. Total time spent: 30 mins including face to face time and time spent for planning, charting and co-ordination of care   Harriette Ohara, MD @T @

## 2021-04-10 NOTE — Progress Notes (Signed)
Pharmacy Brief Note - MTX Monitoring:  Spoke with Zack at [facility] laboratory for MTX Value (has not carried over into Epic):   -MTX level = 0.07 -Urine pH = 9, SCr WNL and stable.  MTX result reported to Dr. Marin Olp who would like to discontinue leucovorin at this time. Also received verbal order to reduce Sodium Bicarb drip to 100 mL/hr  Dimple Nanas, PharmD 04/10/2021 11:24 AM

## 2021-04-11 ENCOUNTER — Inpatient Hospital Stay (HOSPITAL_COMMUNITY): Payer: Medicare Other

## 2021-04-11 ENCOUNTER — Encounter: Payer: Self-pay | Admitting: Hematology & Oncology

## 2021-04-11 ENCOUNTER — Telehealth: Payer: Self-pay | Admitting: *Deleted

## 2021-04-11 LAB — CBC
HCT: 26.3 % — ABNORMAL LOW (ref 36.0–46.0)
Hemoglobin: 9 g/dL — ABNORMAL LOW (ref 12.0–15.0)
MCH: 32.7 pg (ref 26.0–34.0)
MCHC: 34.2 g/dL (ref 30.0–36.0)
MCV: 95.6 fL (ref 80.0–100.0)
Platelets: 250 10*3/uL (ref 150–400)
RBC: 2.75 MIL/uL — ABNORMAL LOW (ref 3.87–5.11)
RDW: 14.6 % (ref 11.5–15.5)
WBC: 5.9 10*3/uL (ref 4.0–10.5)
nRBC: 0 % (ref 0.0–0.2)

## 2021-04-11 LAB — COMPREHENSIVE METABOLIC PANEL
ALT: 60 U/L — ABNORMAL HIGH (ref 0–44)
AST: 26 U/L (ref 15–41)
Albumin: 3.3 g/dL — ABNORMAL LOW (ref 3.5–5.0)
Alkaline Phosphatase: 60 U/L (ref 38–126)
Anion gap: 7 (ref 5–15)
BUN: 11 mg/dL (ref 8–23)
CO2: 31 mmol/L (ref 22–32)
Calcium: 8.9 mg/dL (ref 8.9–10.3)
Chloride: 97 mmol/L — ABNORMAL LOW (ref 98–111)
Creatinine, Ser: 0.69 mg/dL (ref 0.44–1.00)
GFR, Estimated: >60 mL/min (ref >60)
Glucose, Bld: 122 mg/dL — ABNORMAL HIGH (ref 70–99)
Potassium: 3.4 mmol/L — ABNORMAL LOW (ref 3.5–5.1)
Sodium: 135 mmol/L (ref 135–145)
Total Bilirubin: 0.3 mg/dL (ref 0.3–1.2)
Total Protein: 6.2 g/dL — ABNORMAL LOW (ref 6.5–8.1)

## 2021-04-11 LAB — METHOTREXATE
Methotrexate: 0.13
Methotrexate: 0.07

## 2021-04-11 IMAGING — DX DG KNEE COMPLETE 4+V*L*
4 series · 4 of 4 positions shown · non-contrast
Comparison: None.

CLINICAL DATA: Bilateral knee pain, left-greater-than-right.
Swelling.

EXAM:
LEFT KNEE - COMPLETE 4+ VIEW; RIGHT KNEE - COMPLETE 4+ VIEW

[knee ap]
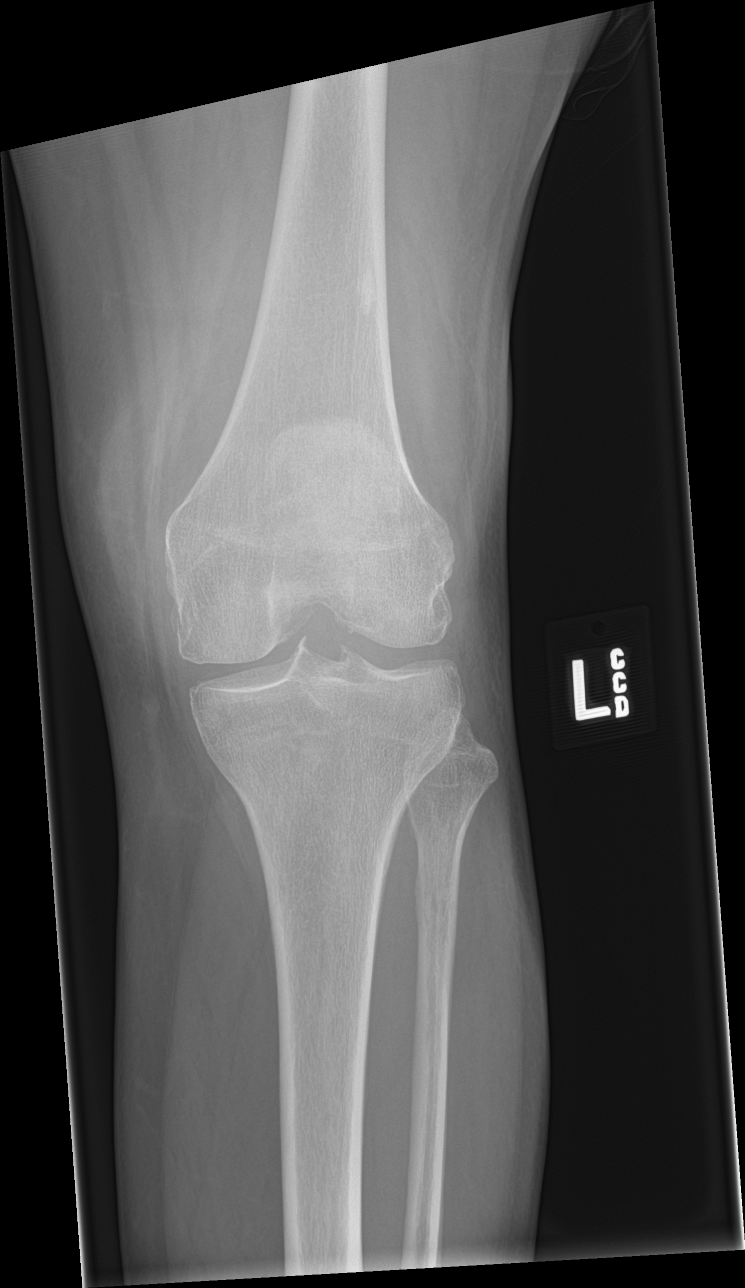

[knee lat]
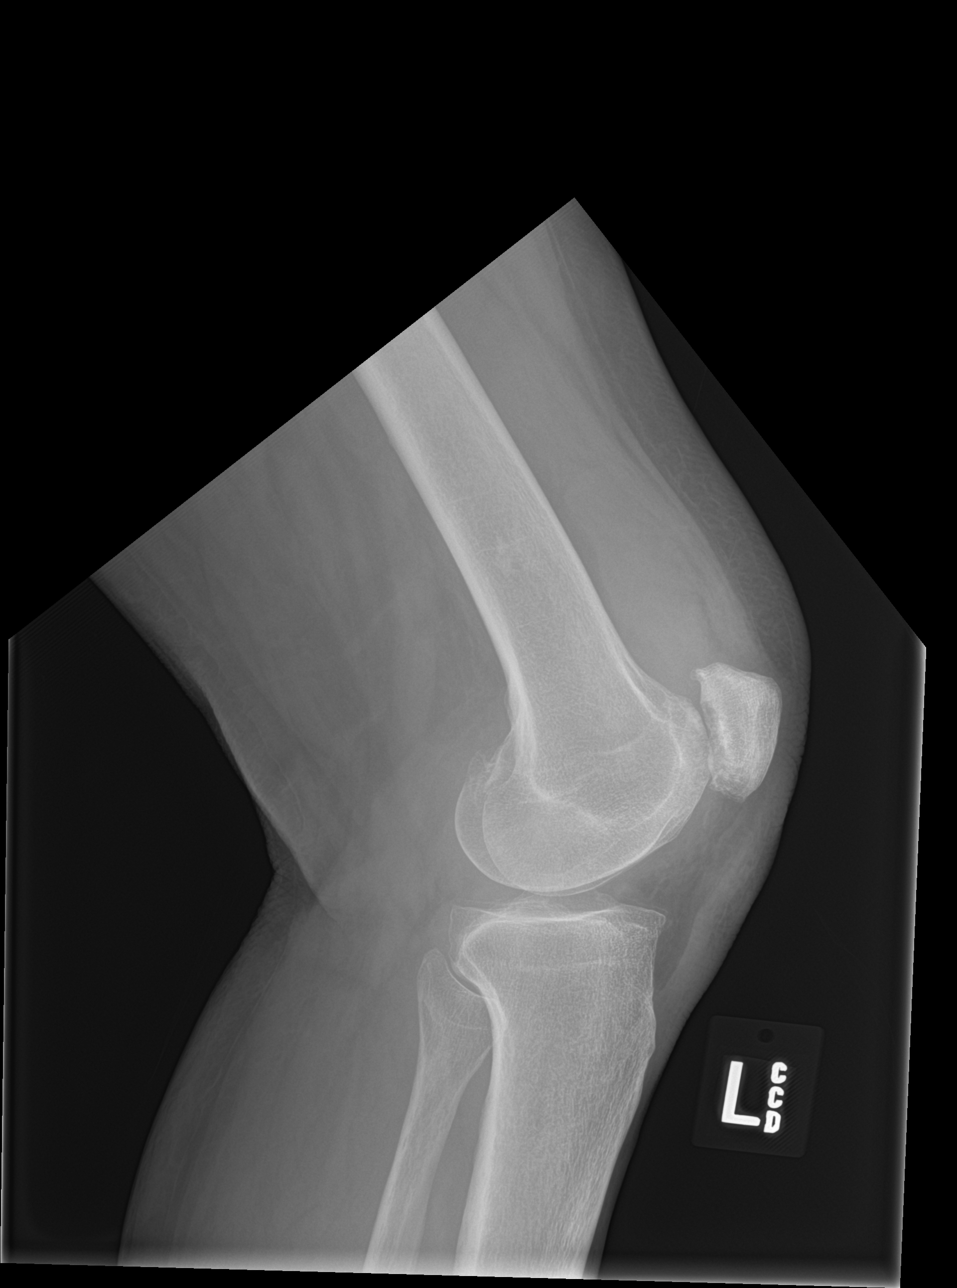

[knee obl (1 of 2)]
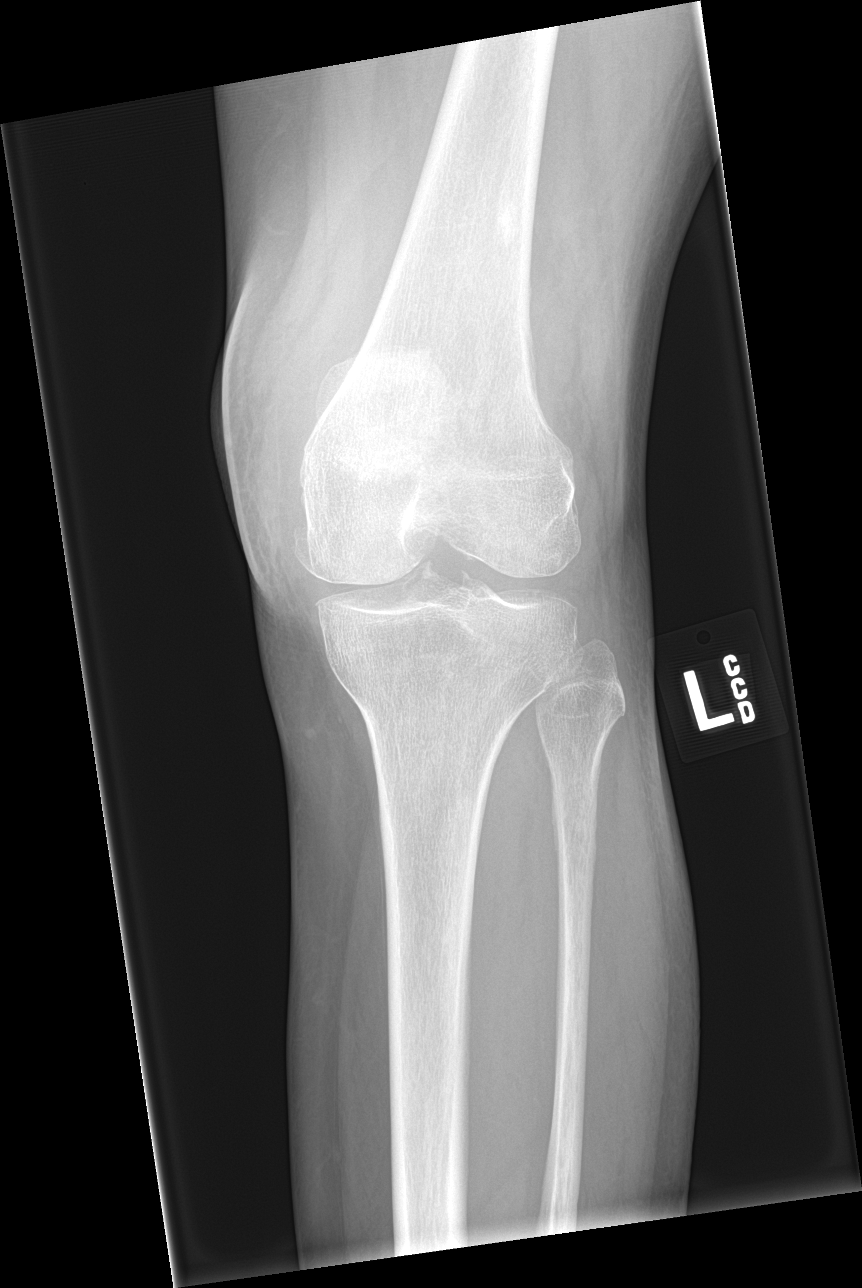

[knee obl (2 of 2)]
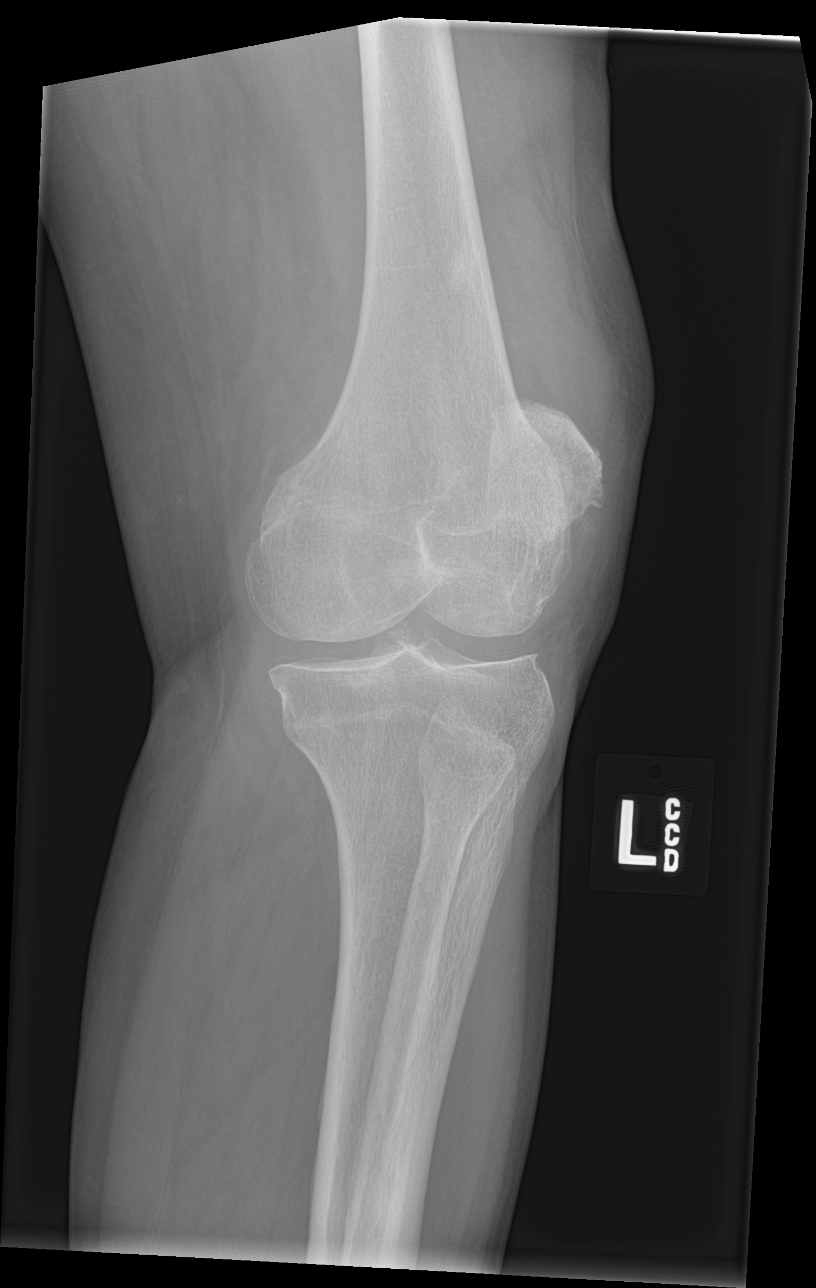

[4 of 4 positions shown; findings below may reference images not displayed]

FINDINGS: Left knee:

Moderate to severe inferior patellofemoral joint space narrowing.
Moderate superior greater than inferior patellar degenerative
osteophytosis. Moderate joint effusion. Mild peripheral lateral
compartment degenerative spurring.

Right knee:

Mild peripheral medial and lateral compartment degenerative
spurring. Severe patellofemoral joint space narrowing with diffuse
bone-on-bone contact. No joint effusion. Mild superior patellar
degenerative osteophytosis. No acute fracture or dislocation.
IMPRESSION: Moderate to severe left and severe right patellofemoral
osteoarthritis.

## 2021-04-11 IMAGING — DX DG KNEE COMPLETE 4+V*R*
4 series · 4 of 4 positions shown · non-contrast
Comparison: None.

CLINICAL DATA: Bilateral knee pain, left-greater-than-right.
Swelling.

EXAM:
LEFT KNEE - COMPLETE 4+ VIEW; RIGHT KNEE - COMPLETE 4+ VIEW

[knee ap]
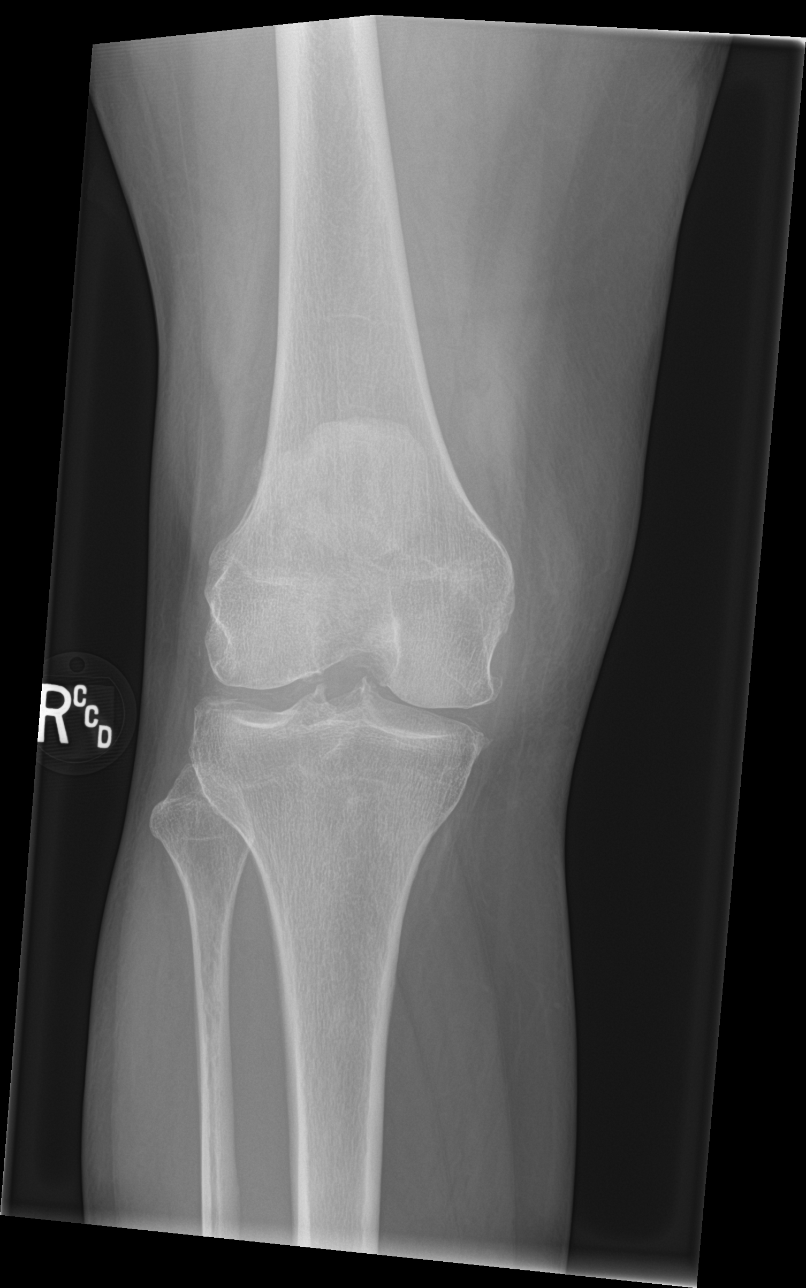

[knee lat]
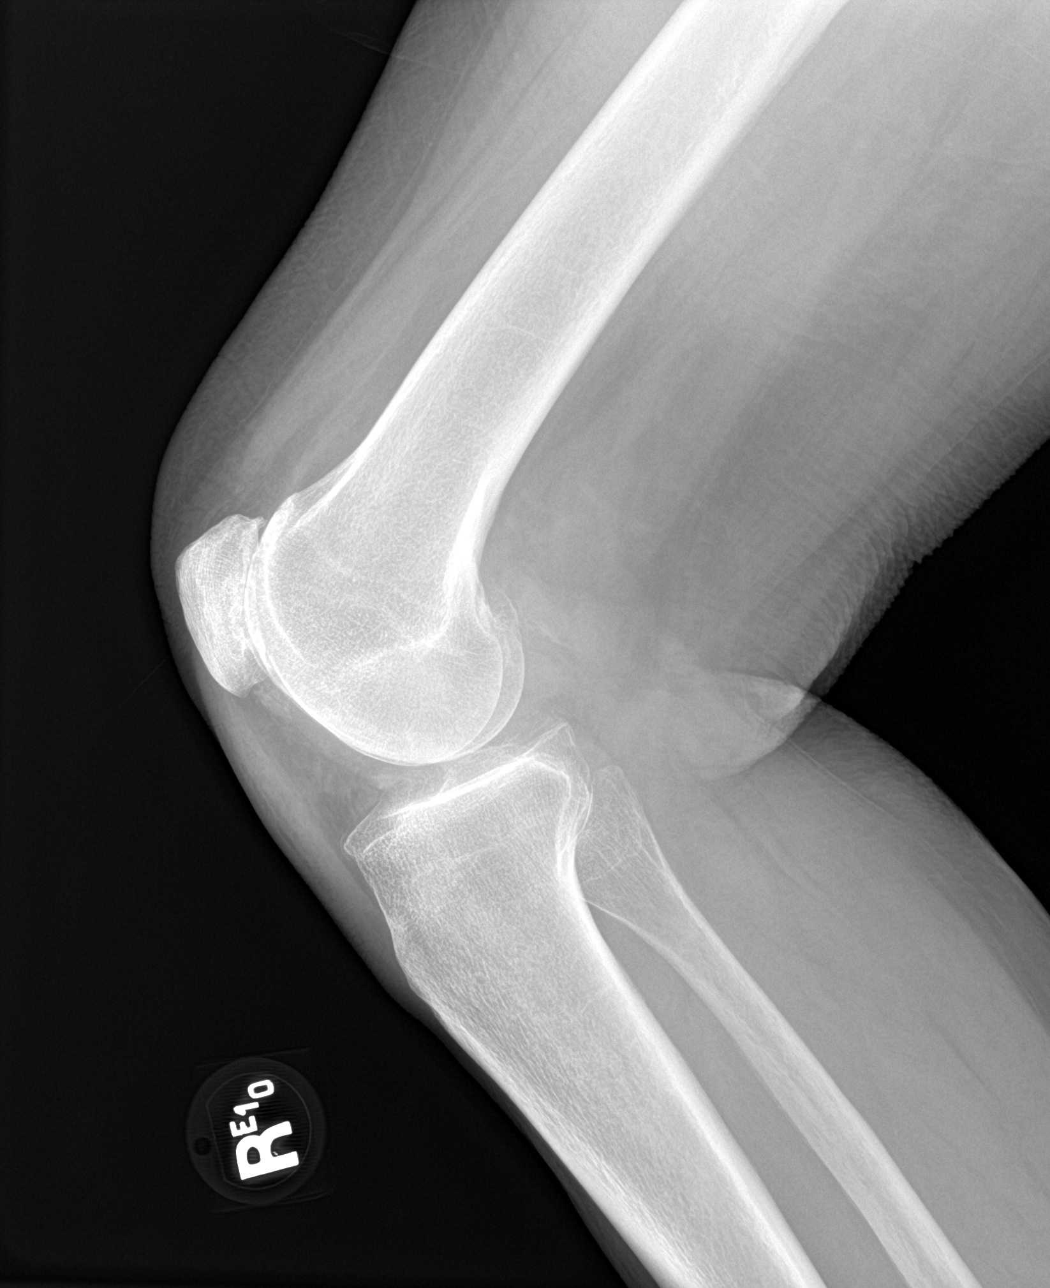

[knee obl (1 of 2)]
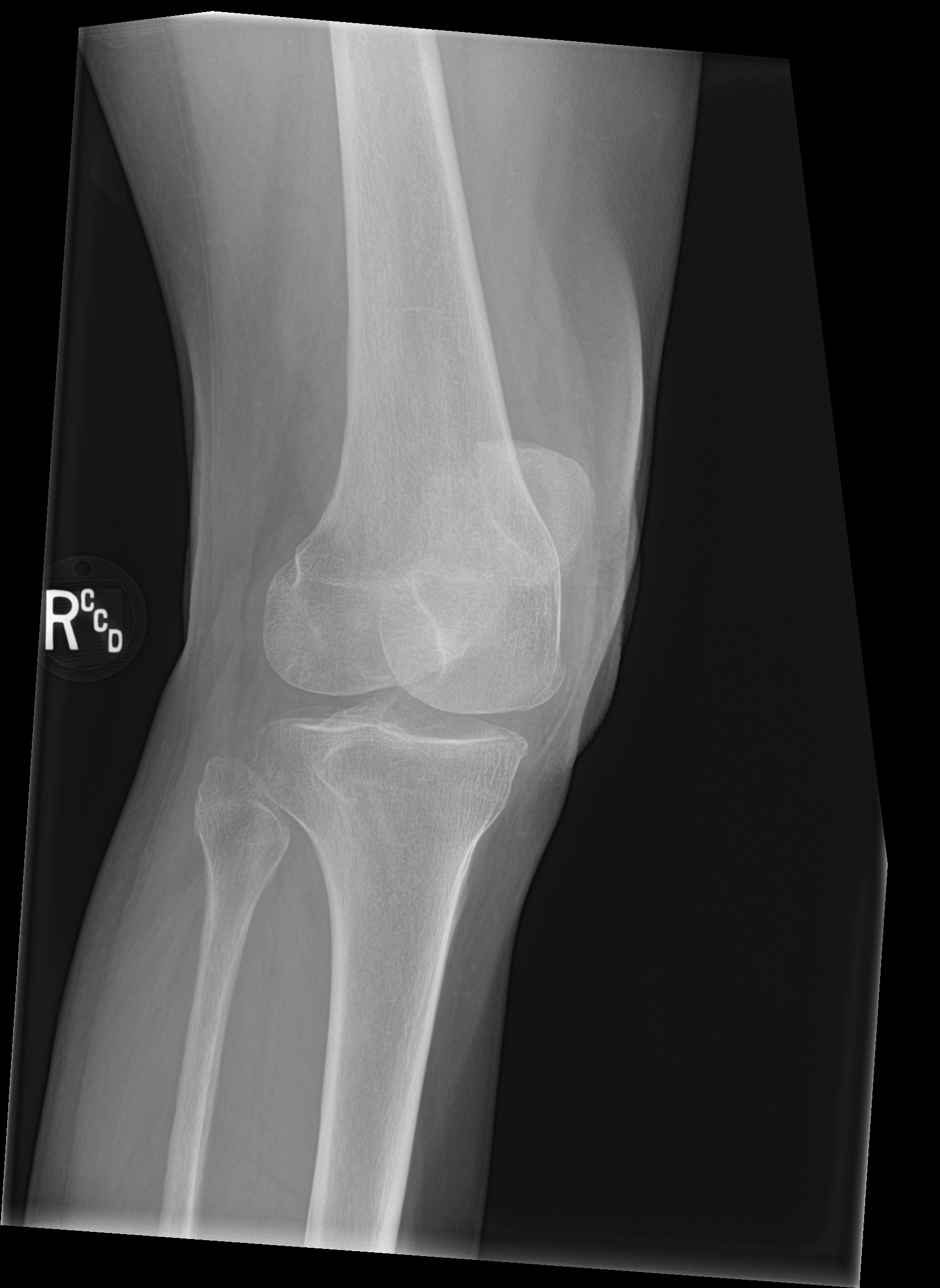

[knee obl (2 of 2)]
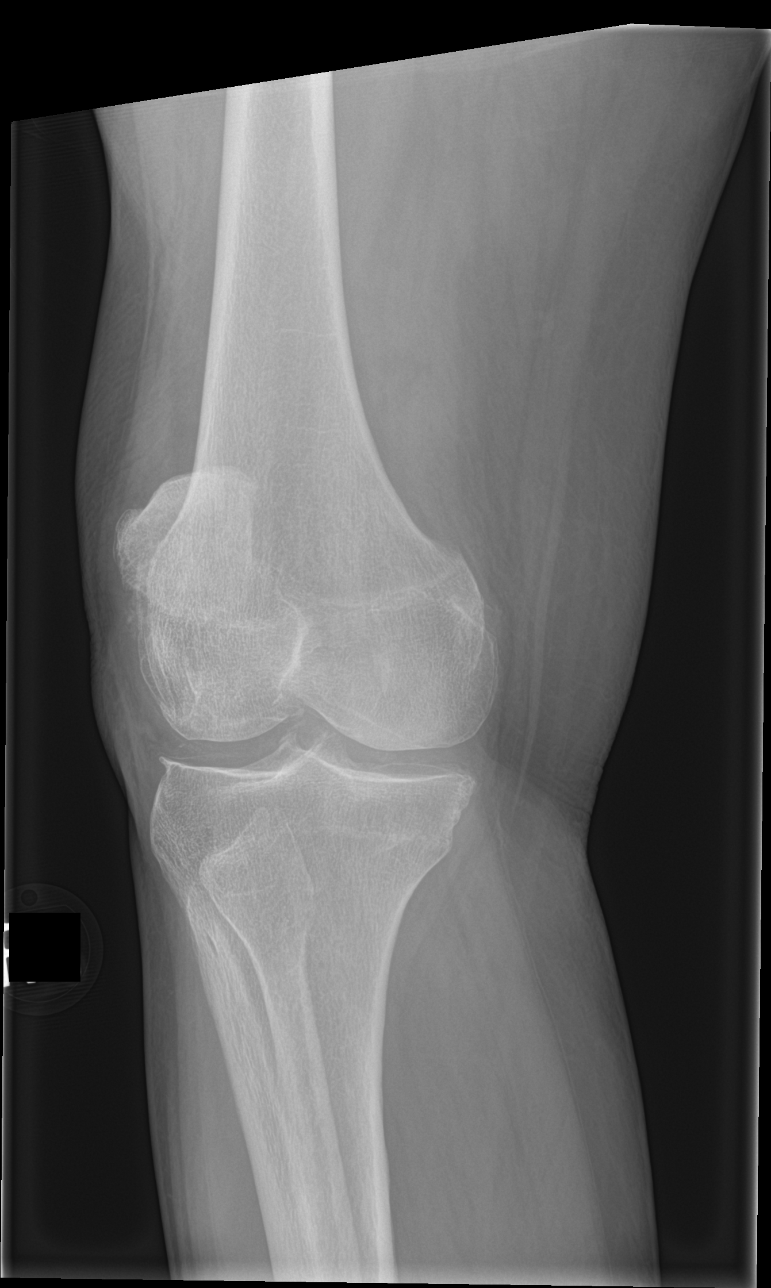

[4 of 4 positions shown; findings below may reference images not displayed]

FINDINGS: Left knee:

Moderate to severe inferior patellofemoral joint space narrowing.
Moderate superior greater than inferior patellar degenerative
osteophytosis. Moderate joint effusion. Mild peripheral lateral
compartment degenerative spurring.

Right knee:

Mild peripheral medial and lateral compartment degenerative
spurring. Severe patellofemoral joint space narrowing with diffuse
bone-on-bone contact. No joint effusion. Mild superior patellar
degenerative osteophytosis. No acute fracture or dislocation.
IMPRESSION: Moderate to severe left and severe right patellofemoral
osteoarthritis.

## 2021-04-11 MED ORDER — SODIUM CHLORIDE 0.9 % IV SOLN
INTRAVENOUS | Status: DC
  Administered 2021-04-11: 250 mL via INTRAVENOUS

## 2021-04-11 MED ORDER — POTASSIUM CHLORIDE 10 MEQ/50ML IV SOLN
10.0000 meq | INTRAVENOUS | Status: AC
  Administered 2021-04-11 (×4): 10 meq via INTRAVENOUS
  Filled 2021-04-11 (×4): qty 50

## 2021-04-11 NOTE — Care Management Important Message (Signed)
Important Message  Patient Details IM Letter placed in Patients room. Name: Ariel [name] MRN: [id number] Date of Birth: 24-Oct-1955   Medicare Important Message Given:  Yes     [name] 04/11/2021, 10:57 AM

## 2021-04-11 NOTE — Progress Notes (Signed)
Ms. Colcord is doing so well.  She now has a very low level methotrexate.  We are stopping the leucovorin.  Decrease the sodium bicarb down to 100cc an hour.  We might actually want to stop the sodium bicarb.  She is having some knee problems yesterday.  There is no new problems today.  She does not have any neurological issues.  There is no tremors in the left leg.  She is eating better.  There is no nausea or vomiting.  She is having no diarrhea.  She is out of bed.  She is having no cough or shortness of breath.  She is having no bleeding.  Her BUN is 11 creatinine 0.69.  Her AST is 26 ALT 60.  Her white cell count is 5.9.  Hemoglobin 9 and platelet count 250,000.  Oral exam does not show any mucositis.  There is no adenopathy in the neck.  Lungs are clear.  Cardiac exam regular rate and rhythm.  Abdomen is soft.  Bowel sounds are present.  There is no guarding or rebound tenderness.  Extremities shows no clubbing, cyanosis or edema.  She has good range of motion of her joints.  Neurological exam shows no focal neurological deficits.  Hopefully, she will be able to go home tomorrow.  I think we should be able to get her home tomorrow.  Hopefully, Radiation Oncology will see her today.  We will see if she needs radiation therapy to the brain.  My other option for treatment for the CNS progression is using high-dose ara-C along with high-dose methotrexate.  For right now, we will see how everything looks tomorrow morning and hopefully get her home.  [name], MD  Philippians 4:13

## 2021-04-11 NOTE — Telephone Encounter (Signed)
Received a call from [facility] from the hospital stating that she is concerned about her knee which is causing her extreme pain and making it very difficult to ambulate.  Feels there is fluid on the knee.  She is concerned about going to her independent living facility with this increase in discomfort in her knee and difficulty ambulating.  Dr Marin Olp notified.  Told Ariel [name] Dr Marin Olp would see her in the morning in the hospital to address these issues

## 2021-04-12 ENCOUNTER — Encounter: Payer: Self-pay | Admitting: Hematology & Oncology

## 2021-04-12 LAB — COMPREHENSIVE METABOLIC PANEL
ALT: 48 U/L — ABNORMAL HIGH (ref 0–44)
AST: 21 U/L (ref 15–41)
Albumin: 3.2 g/dL — ABNORMAL LOW (ref 3.5–5.0)
Alkaline Phosphatase: 71 U/L (ref 38–126)
Anion gap: 6 (ref 5–15)
BUN: 17 mg/dL (ref 8–23)
CO2: 28 mmol/L (ref 22–32)
Calcium: 9.1 mg/dL (ref 8.9–10.3)
Chloride: 101 mmol/L (ref 98–111)
Creatinine, Ser: 0.64 mg/dL (ref 0.44–1.00)
GFR, Estimated: >60 mL/min (ref >60)
Glucose, Bld: 116 mg/dL — ABNORMAL HIGH (ref 70–99)
Potassium: 3.6 mmol/L (ref 3.5–5.1)
Sodium: 135 mmol/L (ref 135–145)
Total Bilirubin: 0.2 mg/dL — ABNORMAL LOW (ref 0.3–1.2)
Total Protein: 6.5 g/dL (ref 6.5–8.1)

## 2021-04-12 LAB — CBC
HCT: 26.8 % — ABNORMAL LOW (ref 36.0–46.0)
Hemoglobin: 9.1 g/dL — ABNORMAL LOW (ref 12.0–15.0)
MCH: 32.9 pg (ref 26.0–34.0)
MCHC: 34 g/dL (ref 30.0–36.0)
MCV: 96.8 fL (ref 80.0–100.0)
Platelets: 280 10*3/uL (ref 150–400)
RBC: 2.77 MIL/uL — ABNORMAL LOW (ref 3.87–5.11)
RDW: 14.6 % (ref 11.5–15.5)
WBC: 4.7 10*3/uL (ref 4.0–10.5)
nRBC: 0 % (ref 0.0–0.2)

## 2021-04-12 LAB — URIC ACID: Uric Acid, Serum: 3.4 mg/dL (ref 2.5–7.1)

## 2021-04-12 MED ORDER — KETOROLAC TROMETHAMINE 15 MG/ML IJ SOLN
15.0000 mg | Freq: Three times a day (TID) | INTRAMUSCULAR | Status: AC
  Administered 2021-04-12 – 2021-04-13 (×4): 15 mg via INTRAVENOUS
  Filled 2021-04-12 (×6): qty 1

## 2021-04-12 MED ORDER — KETOROLAC TROMETHAMINE 30 MG/ML IJ SOLN
30.0000 mg | Freq: Three times a day (TID) | INTRAMUSCULAR | Status: DC
  Administered 2021-04-12: 30 mg via INTRAVENOUS
  Filled 2021-04-12: qty 1

## 2021-04-12 NOTE — Progress Notes (Signed)
Ariel [name] has marked swelling of the left knee.  Not sure exactly why the left knee is so swollen.  There is no redness.  Does not feel warm.  We did do x-rays of both knees yesterday.  She has significant arthritis in both knees.  Somehow, I had believe that the chemotherapy protocol may have have caused some kind of flareups.  I will give her some Toradol.  We will see if this may help a little bit.  If the knee is still swollen tomorrow, she we will have to see if orthopedic surgery can take fluid out.  She says the knees feel little bit better.  She is eating well.  She is having no problems with nausea or vomiting.  Her LFTs have pretty much normalized.  Her renal function looks good.  We will see if physical therapy can help out little bit with her ambulation.  Her white cell count is 4.7.  Hemoglobin 9.1.  Platelet count is 280,000.  Her vital signs show temperature of 98.3.  Pulse 70.  Blood pressure 107/55.  Her lungs are clear.  Cardiac exam regular rate rhythm.  Abdomen is soft.  Bowel sounds are present.  Extremity shows swelling in the left knee.  There is no erythema or warmth.  She has good pulses.  Right knee shows no swelling.  Neurological exam is nonfocal.  The problem right now is this knee swelling.  Again, the x-ray shows arthritis.  I do not think this is gout.  I will check a uric acid level on her.  We will try some Toradol.  We will see about putting some ice on.  Again, there is still swelling tomorrow, will need to see if orthopedic surgery might be able to help [place] out and do a arthrocentesis.  Unfortunately, I thought we might build let her go home today.  However, she just does not ready to go because of the knee issue.  I know that she is getting wonderful care from all the staff up on 6 E.  [name], MD  1 Cor: 13:13

## 2021-04-12 NOTE — Evaluation (Signed)
Physical Therapy Evaluation Patient Details Name: Ariel [name] MRN: [id number] DOB: 06-11-1955 Today's Date: 04/12/2021  History of Present Illness  Ariel [name] is a 66 yo female admitted 2/6 with diffuse large B cell lymphoma. 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   Clinical Impression  Pt admitted with above diagnosis. Pt from ILF, ind at baseline using RW/SPC as needed, stays in studio apartment due to immune system, ind with self care, son locally visits. Pt mobilizing around room and in hallway with RW, supv for safety, ind with toileting. Pt lacking full L knee extension in sitting with LAQ, but passive full extension noted in supine. Pt c/o pain and tenderness in posterior knee and calf of LLE; notified RN of possible [place] to rule out DVT if haven't already due to pain and swelling complaints. Pt tolerates remaining up in recliner at EOS, all needs in [name], denies dizziness or SOB with session. Pt currently with functional limitations due to the deficits listed below (see PT Problem List). Pt will benefit from skilled PT to increase their independence and safety with mobility to allow discharge to the venue listed below.          Recommendations for follow up therapy are one component of a multi-disciplinary discharge planning process, led by the attending physician.  Recommendations may be updated based on patient status, additional functional criteria and insurance authorization.  Follow Up Recommendations No PT follow up    Assistance Recommended at Discharge PRN  Patient can return home with the following       Equipment Recommendations None recommended by PT  Recommendations for Other Services       Functional Status Assessment Patient has had a recent decline in their  functional status and demonstrates the ability to make significant improvements in function in a reasonable and predictable amount of time.     Precautions / Restrictions Precautions Precautions: Fall Restrictions Weight Bearing Restrictions: No      Mobility  Bed Mobility Overal bed mobility: Modified Independent   Transfers Overall transfer level: Modified independent Equipment used: Rolling walker (2 wheels) Transfers: Sit to/from Stand Sit to Stand: Modified independent (Device/Increase time)   Ambulation/Gait Ambulation/Gait assistance: Supervision Gait Distance (Feet): 60 Feet Assistive device: Rolling walker (2 wheels) Gait Pattern/deviations: Step-through pattern, Decreased stride length Gait velocity: decreased  General Gait Details: slow, steady gait with RW, no L knee buckling noted but reports "weakness and tenderness" limiting tolerance, no unsteadiness or LOB  Stairs            Wheelchair Mobility    Modified Rankin (Stroke Patients Only)       Balance Overall balance assessment: Mild deficits observed, not formally tested       Pertinent Vitals/Pain Pain Assessment Pain Assessment: Faces Faces Pain Scale: Hurts a little bit Pain Location: L knee (posterior into calf) Pain Descriptors / Indicators: Sore, Tender Pain Intervention(s): Limited activity within patient's tolerance, Monitored during session    Home Living Family/patient expects to be discharged to:: Assisted living [facility] ILF) Living Arrangements: Alone Available Help at Discharge: Family Type of Home: Apartment Home Access: Elevator  Home Layout: One level Home Equipment: Shower seat;Grab bars - toilet;Grab bars - tub/shower;Rolling Walker (2 wheels) (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 and has equipment from that time)  Additional Comments: Pt reports has been living at ILF since last hospitalization, ind with mobility, has DME needs from spouse, son  locally    Prior Function Prior Level of Function : Independent/Modified Independent  Mobility Comments: pt reports ambulating SPC or RW as needed at ILF, denies falls ADLs Comments: pt reports ind with ADLs/IADLs at ILF     Hand Dominance        Extremity/Trunk Assessment   Upper Extremity Assessment Upper Extremity Assessment: Overall WFL for tasks assessed    Lower Extremity Assessment Lower Extremity Assessment: Overall WFL for tasks assessed;LLE deficits/detail LLE Deficits / Details: lacking ~20 deg knee extension, reports pain/tenderness in posterior knee and calf, swelling in knee noted LLE Sensation: WNL LLE Coordination: WNL    Cervical / Trunk Assessment Cervical / Trunk Assessment: Normal  Communication   Communication: No difficulties  Cognition Arousal/Alertness: Awake/alert Behavior During Therapy: WFL for tasks assessed/performed Overall Cognitive Status: Within Functional Limits for tasks assessed     General Comments      Exercises     Assessment/Plan    PT Assessment Patient needs continued PT services  PT Problem List Decreased strength;Decreased activity tolerance;Decreased balance;Decreased mobility;Pain       PT Treatment Interventions DME instruction;Gait training;Functional mobility training;Therapeutic activities;Therapeutic exercise;Balance training;Patient/family education    PT Goals (Current goals can be found in the Care Plan section)  Acute Rehab PT Goals Patient Stated Goal: return to ILF PT Goal Formulation: With patient Time For Goal Achievement: 04/26/21 Potential to Achieve Goals: Good    Frequency Min 3X/week     Co-evaluation               AM-PAC PT "6 Clicks" Mobility  Outcome Measure Help needed turning from your back to your side while in a flat bed without using bedrails?: None Help needed moving from lying on your back to sitting on the side of a flat bed without using bedrails?: None Help needed moving  to and from a bed to a chair (including a wheelchair)?: None Help needed standing up from a chair using your arms (e.g., wheelchair or bedside chair)?: None Help needed to walk in hospital room?: A Little Help needed climbing 3-5 steps with a railing? : A Little 6 Click Score: 22    End of Session     Patient left: in chair;with call bell/phone within [name] Nurse Communication: Mobility status;Other (comment) ([place] for left posterior knee/calf pain) PT Visit Diagnosis: Other abnormalities of gait and mobility (R26.89);Pain Pain - Right/Left: Left Pain - part of body: Knee;Leg    Time: 1350-1410 PT Time Calculation (min) (ACUTE ONLY): 20 min   Charges:   PT Evaluation $PT Eval Low Complexity: 1 Low           Tori Jannifer Fischler PT, DPT 04/12/21, 2:23 PM

## 2021-04-13 ENCOUNTER — Encounter: Payer: Self-pay | Admitting: Hematology & Oncology

## 2021-04-13 ENCOUNTER — Inpatient Hospital Stay (HOSPITAL_COMMUNITY): Payer: Medicare Other

## 2021-04-13 DIAGNOSIS — M7989 Other specified soft tissue disorders: Secondary | ICD-10-CM

## 2021-04-13 DIAGNOSIS — M25562 Pain in left knee: Secondary | ICD-10-CM

## 2021-04-13 LAB — CBC WITH DIFFERENTIAL/PLATELET
Abs Immature Granulocytes: 0.03 10*3/uL (ref 0.00–0.07)
Basophils Absolute: 0 10*3/uL (ref 0.0–0.1)
Basophils Relative: 0 %
Eosinophils Absolute: 0.1 10*3/uL (ref 0.0–0.5)
Eosinophils Relative: 3 %
HCT: 24.5 % — ABNORMAL LOW (ref 36.0–46.0)
Hemoglobin: 8.3 g/dL — ABNORMAL LOW (ref 12.0–15.0)
Immature Granulocytes: 1 %
Lymphocytes Relative: 17 %
Lymphs Abs: 0.7 10*3/uL (ref 0.7–4.0)
MCH: 32.7 pg (ref 26.0–34.0)
MCHC: 33.9 g/dL (ref 30.0–36.0)
MCV: 96.5 fL (ref 80.0–100.0)
Monocytes Absolute: 0.3 10*3/uL (ref 0.1–1.0)
Monocytes Relative: 7 %
Neutro Abs: 2.8 10*3/uL (ref 1.7–7.7)
Neutrophils Relative %: 72 %
Platelets: 277 10*3/uL (ref 150–400)
RBC: 2.54 MIL/uL — ABNORMAL LOW (ref 3.87–5.11)
RDW: 14.6 % (ref 11.5–15.5)
WBC: 3.9 10*3/uL — ABNORMAL LOW (ref 4.0–10.5)
nRBC: 0 % (ref 0.0–0.2)

## 2021-04-13 LAB — COMPREHENSIVE METABOLIC PANEL
ALT: 45 U/L — ABNORMAL HIGH (ref 0–44)
AST: 25 U/L (ref 15–41)
Albumin: 2.9 g/dL — ABNORMAL LOW (ref 3.5–5.0)
Alkaline Phosphatase: 77 U/L (ref 38–126)
Anion gap: 7 (ref 5–15)
BUN: 18 mg/dL (ref 8–23)
CO2: 26 mmol/L (ref 22–32)
Calcium: 8.6 mg/dL — ABNORMAL LOW (ref 8.9–10.3)
Chloride: 104 mmol/L (ref 98–111)
Creatinine, Ser: 0.66 mg/dL (ref 0.44–1.00)
GFR, Estimated: >60 mL/min (ref >60)
Glucose, Bld: 116 mg/dL — ABNORMAL HIGH (ref 70–99)
Potassium: 3.8 mmol/L (ref 3.5–5.1)
Sodium: 137 mmol/L (ref 135–145)
Total Bilirubin: 0.2 mg/dL — ABNORMAL LOW (ref 0.3–1.2)
Total Protein: 5.7 g/dL — ABNORMAL LOW (ref 6.5–8.1)

## 2021-04-13 NOTE — Progress Notes (Signed)
She is doing pretty well.  She still has a little bit of swelling in the left knee.  She said that the swelling is improved.  There is no pain.  I will get a get a Doppler of her left leg to see if there is a Baker's cyst.  She did have some physical therapy yesterday.  She walked and did pretty well.  She did not have any issues with nausea or vomiting.  She still has not had a bowel movement yet.  Hopefully she will have this today.  She her case was reviewed in CNS Tumor Board.  It is felt that there may not be disease progression.  It was felt that we needed to just give treatment little bit more time.  I totally agree with this.  Her labs show white count 3.9.  Hemoglobin 8.3.  Platelet count 277,000.  Her BUN is 18 creatinine 0.66.  Albumin is 2.9.  Her liver function studies look okay.  She has had no bleeding.  She is tolerating Toradol well.  She is putting some ice on the left knee.  Her vital signs show temperature 97.5.  Pulse 79.  Blood pressure 112/62.  Her oral exam shows no mucositis.  Lungs are clear.  Cardiac exam regular rate and rhythm.  Abdomen is soft.  Bowel sounds are present.  There is no distention.  There is no palpable liver or spleen tip.  Extremities shows continued swelling in the left knee.  Does not appear to be as prominent.  There is no erythema or warmth.  There is no tenderness to palpation.  There may be a little bit of fullness behind the left knee.  Neurological exam is nonfocal.  I still think that we need to watch her at least 1 more day.  I want to make sure that this left knee does not become a problem.  We will see what the Doppler shows.  Again she may have a Baker's cyst.  I do appreciate the incredible care that she is getting from the staff upon 6 E.  Her birthday is coming up in a couple days.  It would be nice to get her home for her birthday.  [name], MD  [facility] [phone]

## 2021-04-13 NOTE — Progress Notes (Signed)
LLE venous duplex has been completed.    Results can be found under chart review under CV PROC. 04/13/2021 10:05 AM Yassmin Binegar RVT, RDMS

## 2021-04-13 NOTE — Progress Notes (Signed)
°   04/13/21 1400  Mobility  Activity Ambulated with assistance in hallway  Level of Assistance Modified independent, requires aide device or extra time  Assistive Device Other (Comment) (IV pole)  Distance Ambulated (ft) 400 ft  Activity Response Tolerated well  $Mobility charge 1 Mobility   Pt requested second session. Pt agreeable to mobilize this afternoon. Ambulated about 467ft in hall with IV pole, tolerated well. No complaints. Left pt in bed, call bell at side. RN/NT notified of session.  [place] Specialist Acute Rehab Services Office: [phone]

## 2021-04-13 NOTE — TOC Initial Note (Signed)
Transition of Care [facility]) - Initial/Assessment Note    Patient Details  Name: Ariel [name] MRN: [id number] Date of Birth: 1955/09/06  [facility]) CM/SW Contact:    Lynnell Catalan, RN Phone Number: 04/13/2021, 3:05 PM   Transition of Care (TOC) Screening Note   Patient Details  Name: Ariel [name] Date of Birth: Sep 04, 1955   Transition of Care [facility]) CM/SW Contact:    Amilia Vandenbrink, Marjie Skiff, RN Phone Number: 04/13/2021, 3:05 PM    Transition of Care Department [facility]) has reviewed patient and no TOC needs have been identified at this time. We will continue to monitor patient advancement through interdisciplinary progression rounds. If new patient transition needs arise, please place a TOC consult.                    Activities of Daily Living Home Assistive Devices/Equipment: Cane (specify quad or straight), Bedside commode/3-in-1, Walker (specify type), Grab bars in shower, Grab bars around toilet, Eyeglasses (straight cane) ADL Screening (condition at time of admission) Patient's cognitive ability adequate to safely complete daily activities?: Yes Is the patient deaf or have difficulty hearing?: No Does the patient have difficulty seeing, even when wearing glasses/contacts?: No Does the patient have difficulty concentrating, remembering, or making decisions?: No Patient able to express need for assistance with ADLs?: Yes Does the patient have difficulty dressing or bathing?: No Independently performs ADLs?: Yes (appropriate for developmental age) Does the patient have difficulty walking or climbing stairs?: No Weakness of Legs: None Weakness of Arms/Hands: None  Admission diagnosis:  Diffuse large B cell lymphoma ([place]) [C83.30] Patient Active Problem List   Diagnosis Date Noted   Diffuse large B cell lymphoma ([place]) 03/07/2021   Encounter for antineoplastic chemotherapy 03/07/2021   AMS (altered mental status) 01/14/2021   Neutropenia ([place]) 01/14/2021   High  grade B-cell lymphoma ([place]) 12/22/2020   Goals of care, counseling/discussion 43/32/9518   Acute metabolic encephalopathy 84/16/6063   Brain metastasis ([place]) 12/09/2020   Brain tumor ([place]) 12/08/2020   Hyponatremia 12/08/2020   Night sweat 11/30/2020   Chicken pox 01/60/1093   Complication of anesthesia 11/22/2020   PONV (postoperative nausea and vomiting) 11/22/2020   Skin rash 11/10/2020   Tachycardia 11/10/2020   Anemia 11/10/2020   Hyperglycemia 11/10/2020   TIA (transient ischemic attack) 10/07/2020   Iron deficiency anemia 10/07/2020   Aphasia 10/06/2020   Melanoma ([place]) 2021   Multinodular goiter 12/22/2013   Subclinical hyperthyroidism 12/19/2013   Urinary incontinence 11/19/2013   Hyperlipidemia 11/19/2012   Routine general medical examination at a health care facility 11/18/2012   PCP:  [name], NP Pharmacy:   [facility] [address] [place] [address] Phone: [phone] Fax: [phone]     Social Determinants of Health (SDOH) Interventions    Readmission Risk Interventions Readmission Risk Prevention Plan 04/13/2021 03/08/2021  Transportation Screening Complete Complete  PCP or Specialist Appt within 3-5 Days Complete Complete  HRI or Home Care Consult Complete Complete  Social Work Consult for [place] Planning/Counseling Complete Complete  Palliative Care Screening Not Applicable Not Applicable  Medication Review [employer]) Complete Complete  Some recent data might be hidden

## 2021-04-13 NOTE — Progress Notes (Signed)
°   04/13/21 1100  Mobility  Activity Ambulated with assistance in hallway  Level of Assistance Modified independent, requires aide device or extra time  Assistive Device Front wheel walker  Distance Ambulated (ft) 350 ft  Activity Response Tolerated well  $Mobility charge 1 Mobility   Pt agreeable to mobilize this morning. Requested to use [facility] prior to session. Did this independently. Ambulated about 379ft in hall with RW, tolerated well. She did note some soreness to her right knee. Left pt in chair, call bell at side. RN/NT notified of session.  [place] Specialist Acute Rehab Services Office: [phone]

## 2021-04-13 NOTE — Progress Notes (Signed)
Called Dr. Antonieta Pert office x2 today. Spoke with nurse about de-accessing patient's port. No orders for heparin for port flush de-access received.

## 2021-04-14 ENCOUNTER — Telehealth: Payer: Self-pay | Admitting: Family

## 2021-04-14 ENCOUNTER — Encounter: Payer: Self-pay | Admitting: Hematology & Oncology

## 2021-04-14 LAB — COMPREHENSIVE METABOLIC PANEL WITH GFR
ALT: 41 U/L (ref 0–44)
AST: 23 U/L (ref 15–41)
Albumin: 3 g/dL — ABNORMAL LOW (ref 3.5–5.0)
Alkaline Phosphatase: 75 U/L (ref 38–126)
Anion gap: 5 (ref 5–15)
BUN: 14 mg/dL (ref 8–23)
CO2: 27 mmol/L (ref 22–32)
Calcium: 8.7 mg/dL — ABNORMAL LOW (ref 8.9–10.3)
Chloride: 106 mmol/L (ref 98–111)
Creatinine, Ser: 0.69 mg/dL (ref 0.44–1.00)
GFR, Estimated: >60 mL/min
Glucose, Bld: 97 mg/dL (ref 70–99)
Potassium: 3.8 mmol/L (ref 3.5–5.1)
Sodium: 138 mmol/L (ref 135–145)
Total Bilirubin: <0.1 mg/dL — ABNORMAL LOW (ref 0.3–1.2)
Total Protein: 5.7 g/dL — ABNORMAL LOW (ref 6.5–8.1)

## 2021-04-14 LAB — CBC WITH DIFFERENTIAL/PLATELET
Abs Immature Granulocytes: 0.04 K/uL (ref 0.00–0.07)
Basophils Absolute: 0 K/uL (ref 0.0–0.1)
Basophils Relative: 0 %
Eosinophils Absolute: 0.1 K/uL (ref 0.0–0.5)
Eosinophils Relative: 3 %
HCT: 23.9 % — ABNORMAL LOW (ref 36.0–46.0)
Hemoglobin: 8 g/dL — ABNORMAL LOW (ref 12.0–15.0)
Immature Granulocytes: 1 %
Lymphocytes Relative: 20 %
Lymphs Abs: 0.7 K/uL (ref 0.7–4.0)
MCH: 32.5 pg (ref 26.0–34.0)
MCHC: 33.5 g/dL (ref 30.0–36.0)
MCV: 97.2 fL (ref 80.0–100.0)
Monocytes Absolute: 0.4 K/uL (ref 0.1–1.0)
Monocytes Relative: 10 %
Neutro Abs: 2.4 K/uL (ref 1.7–7.7)
Neutrophils Relative %: 66 %
Platelets: 296 K/uL (ref 150–400)
RBC: 2.46 MIL/uL — ABNORMAL LOW (ref 3.87–5.11)
RDW: 14.2 % (ref 11.5–15.5)
WBC: 3.7 K/uL — ABNORMAL LOW (ref 4.0–10.5)
nRBC: 0 % (ref 0.0–0.2)

## 2021-04-14 LAB — PREPARE RBC (CROSSMATCH)

## 2021-04-14 MED ORDER — FUROSEMIDE 10 MG/ML IJ SOLN
20.0000 mg | Freq: Once | INTRAMUSCULAR | Status: AC
  Administered 2021-04-14: 20 mg via INTRAVENOUS
  Filled 2021-04-14: qty 2

## 2021-04-14 MED ORDER — DOCUSATE SODIUM 100 MG PO CAPS: 100.0000 mg | ORAL_CAPSULE | Freq: Two times a day (BID) | ORAL | 0 refills | Status: DC | PRN

## 2021-04-14 MED ORDER — SODIUM CHLORIDE 0.9% IV SOLUTION: Freq: Once | INTRAVENOUS | Status: AC

## 2021-04-14 MED ORDER — HEPARIN SOD (PORK) LOCK FLUSH 100 UNIT/ML IV SOLN
500.0000 [IU] | Freq: Once | INTRAVENOUS | Status: AC
  Administered 2021-04-14: 500 [IU] via INTRAVENOUS
  Filled 2021-04-14: qty 5

## 2021-04-14 NOTE — Progress Notes (Signed)
Spoke with Dr. Marin Olp this morning regarding port de-access and re-access per policy. Verbal order from MD to keep port-a-cath accessed and NOT de-access and re-access.

## 2021-04-14 NOTE — Progress Notes (Signed)
Ariel [name] is feeling okay.  Unfortunately, her hemoglobin is down to 8.  This is from the chemotherapy.  I think we will going to have to transfuse her.  I just feel more comfortable transfuse her before she goes to the assisted living facility.  I talked to her about this.  She is agreeable.  She has had a blood transfusion in the past without any problems.  She had a Doppler of the left leg yesterday.  There is no blood clot.  There was a small Baker's cyst behind the knee.  The swelling in the knee seems to be doing better.  She ambulated.  She has not complained of any pain in the knee.  We had her on Toradol which seem to be helping.  As far as her Port-A-Cath is concerned, I would not deaccessed this and then reaccess it.  She is going to be here 1 more day and I think that would be safe to leave the access as it is.  She is eating better.  She is having no problems going to the bathroom.  She had bowel movements yesterday.  She has had no cough or shortness of breath.  She has had no mouth sores.  There is been no headache.  She has had no blurred vision.   her vital signs are temperature 98.  Pulse 88.  Blood pressure 120/58.  Her lungs are clear.  Cardiac exam regular rate and rhythm.  Abdomen is soft.  She has good bowel sounds.  There is no fluid wave.  There is no palpable liver or spleen tip.  Back exam shows no tenderness of the spine ribs or his.  Strongly shows decrease swelling in the left knee.  There is still some swelling but not as much.  There is no erythema or warmth.  There may be a fullness behind the knee.  Neurological exam is nonfocal.  Again, we will transfuse her today.  I suspect we probably will be able to get her home tomorrow.  Tomorrow is her birthday.  It would be wonderful if we could get her home for her birthday so she can celebrate it with her friends and family.  [name], MD  Darlyn Chamber 31:25

## 2021-04-14 NOTE — Telephone Encounter (Signed)
Parameds is requesting an update on the medical records request. Confirmed our fax number they can be reached at [phone] and the extension is the same as the case number [id number]. Please advise.

## 2021-04-14 NOTE — Discharge Summary (Signed)
Discharge Summary  Patient ID: Ariel [name] MRN: [id number] DOB/AGE: 1955-12-11 66 y.o.  Admit date: 04/04/2021 Discharge date: 04/14/2021  Discharge Diagnoses:  Principal Problem:   Diffuse large B cell lymphoma ([place]) Active Problems:   High grade B-cell lymphoma ([place])   Encounter for antineoplastic chemotherapy   Discharged Condition: good  Discharge Labs:  CBC    Component Value Date/Time   WBC 3.7 (L) 04/14/2021 0521   RBC 2.46 (L) 04/14/2021 0521   HGB 8.0 (L) 04/14/2021 0521   HGB 9.3 (L) 03/29/2021 1019   HCT 23.9 (L) 04/14/2021 0521   PLT 296 04/14/2021 0521   PLT 341 03/29/2021 1019   MCV 97.2 04/14/2021 0521   MCH 32.5 04/14/2021 0521   MCHC 33.5 04/14/2021 0521   RDW 14.2 04/14/2021 0521   LYMPHSABS 0.7 04/14/2021 0521   MONOABS 0.4 04/14/2021 0521   EOSABS 0.1 04/14/2021 0521   BASOSABS 0.0 04/14/2021 0521   CMP Latest Ref Rng & Units 04/14/2021 04/13/2021 04/12/2021  Glucose 70 - 99 mg/dL 97 116(H) 116(H)  BUN 8 - 23 mg/dL 14 18 17   Creatinine 0.44 - 1.00 mg/dL 0.69 0.66 0.64  Sodium 135 - 145 mmol/L 138 137 135  Potassium 3.5 - 5.1 mmol/L 3.8 3.8 3.6  Chloride 98 - 111 mmol/L 106 104 101  CO2 22 - 32 mmol/L 27 26 28   Calcium 8.9 - 10.3 mg/dL 8.7(L) 8.6(L) 9.1  Total Protein 6.5 - 8.1 g/dL 5.7(L) 5.7(L) 6.5  Total Bilirubin 0.3 - 1.2 mg/dL <0.1(L) 0.2(L) 0.2(L)  Alkaline Phos 38 - 126 U/L 75 77 71  AST 15 - 41 U/L 23 25 21   ALT 0 - 44 U/L 41 45(H) 48(H)   Significant Diagnostic Studies:  1.  04/07/2021 MRI brain with and without contrast-multiple new and/or increased scattered areas of T2/FLAIR signal abnormality involving the bilateral cerebral hemispheres.  Several of which demonstrate irregular postcontrast enhancement and findings consistent with interval progression of disease. 2.  04/11/2021 x-ray left and right knees-moderate to severe left and severe right patellofemoral osteoarthritis.  Consults: None  Procedures:  1.  2 units PRBCs on  04/14/2021.  Disposition: Discharge disposition: 01-Home or Self Care      Allergies as of 04/14/2021       Reactions   Codeine Nausea Only   Decadron [dexamethasone]    Plavix [clopidogrel]    Hives,lip swelling   Prednisone    Steroids caused psychological issues         Medication List     STOP taking these medications    ciprofloxacin 500 MG tablet Commonly known as: CIPRO   fluconazole 100 MG tablet Commonly known as: DIFLUCAN   OLANZapine 10 MG tablet Commonly known as: ZYPREXA       TAKE these medications    acyclovir 200 MG capsule Commonly known as: ZOVIRAX Take 1 capsule (200 mg total) by mouth 2 (two) times daily.   antiseptic oral rinse Liqd 15 mLs by Mouth Rinse route every 4 (four) hours.   docusate sodium 100 MG capsule Commonly known as: COLACE Take 1 capsule (100 mg total) by mouth 2 (two) times daily as needed for mild constipation.   lidocaine-prilocaine cream Commonly known as: EMLA Apply to affected area once   sodium bicarbonate/sodium chloride Soln 30 application by Mouth Rinse route every 4 (four) hours.         Follow-up Information     Volanda Napoleon, MD Follow up in 10 day(s).   Specialty: Oncology  Contact information: [address] [place] [address] [phone]                 HPI: 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  She has been receiving systemic chemotherapy with R-ICE/R-MTV.  She was admitted to the hospital on 04/04/2021 for cycle #3 of her chemotherapy.  On the day of admission, she reported some fatigue but otherwise had no complaints and specifically denied mucositis, headaches,  dizziness, fevers, chills, chest pain, shortness of breath, abdominal pain, nausea, vomiting, constipation, diarrhea, bleeding.  Hospital Course:  Chemotherapy was initiated as planned on the day of admission.  This admission, she overall tolerated her treatment quite well.  She did have some mild headaches and also developed some spasms in her left leg.  An MRI of the brain was obtained which unfortunately showed disease progression.  Additionally, her spasms were treated with baclofen and she was given some potassium as well.  She developed bilateral knee pain and x-rays were obtained.  The x-ray showed significant arthritis in both of her knees and she was treated with Toradol.  Her hemoglobin was down to 8.0 on 04/14/2021 and she was given 2 units PRBCs.  Her transfusion was completed prior to 1600 and the patient wanted to discharge home this evening rather than wait until tomorrow.  The patient was deemed to be stable for discharge back to her assisted living facility.  Outpatient follow-up appointments will be arranged by [facility].  The patient will be contacted with the date/time of her follow-up.   Discharge Instructions     Diet general   Complete by: As directed    Increase activity slowly   Complete by: As directed        Signed: Mikey Bussing 04/14/2021, 3:27 PM

## 2021-04-14 NOTE — Progress Notes (Signed)
[facility] M[facility] to be D/C'd  assisted living  per MD order.  Discussed with the patient and all questions fully answered.  Port-a-cath de-accessed.  An After Visit Summary was printed and given to the patient.   D/c education completed with patient/family including follow up instructions, medication list, d/c activities limitations if indicated, with other d/c instructions as indicated by MD - patient able to verbalize understanding, all questions fully answered.   Patient instructed to return to ED, call 911, or call MD for any changes in condition.   Patient escorted via [employer], and D/C home via private auto.  Manuella Ghazi 04/14/2021 4:37 PM

## 2021-04-15 ENCOUNTER — Encounter: Payer: Self-pay | Admitting: Hematology & Oncology

## 2021-04-15 ENCOUNTER — Encounter: Payer: Self-pay | Admitting: *Deleted

## 2021-04-15 LAB — TYPE AND SCREEN
ABO/RH(D): A POS
Antibody Screen: NEGATIVE
Unit division: 0
Unit division: 0

## 2021-04-15 LAB — BPAM RBC
Blood Product Expiration Date: 202303062359
Blood Product Expiration Date: 202303082359
ISSUE DATE / TIME: 202302160955
ISSUE DATE / TIME: 202302161209
Unit Type and Rh: 6200
Unit Type and Rh: 6200

## 2021-04-15 NOTE — Telephone Encounter (Signed)
Lvm for someone to call about the records. First time I see this request and will like to know what do they need and their fax number.

## 2021-04-15 NOTE — Progress Notes (Signed)
Patient discharged from the hospital. Scheduled for follow up appointment per Dr Antonieta Pert instructions.  Oncology Nurse Navigator Documentation  Oncology Nurse Navigator Flowsheets 04/15/2021  Abnormal Finding Date -  Confirmed Diagnosis Date -  Phase of Treatment -  Chemotherapy Actual Start Date: -  Navigator Follow Up Date: 04/29/2021  Navigator Follow Up Reason: Follow-up Appointment  Navigator Location [facility]  Navigator Encounter Type Appt/Treatment Plan Review  Telephone -  Treatment Initiated Date -  Patient Visit Type MedOnc  Treatment Phase Active Tx  Barriers/Navigation Needs Coordination of Care;Education  Education -  Interventions None Required  Acuity Level 2-Minimal Needs (1-2 Barriers Identified)  Referrals -  Coordination of Care -  Education Method -  Support Groups/Services Friends and Family  Time Spent with Patient 15

## 2021-04-16 ENCOUNTER — Encounter: Payer: Self-pay | Admitting: Hematology & Oncology

## 2021-04-17 ENCOUNTER — Encounter: Payer: Self-pay | Admitting: Hematology & Oncology

## 2021-04-26 ENCOUNTER — Inpatient Hospital Stay: Payer: Medicare Other

## 2021-04-26 ENCOUNTER — Encounter: Payer: Self-pay | Admitting: Hematology & Oncology

## 2021-04-26 ENCOUNTER — Inpatient Hospital Stay: Payer: Medicare Other | Attending: Internal Medicine

## 2021-04-26 ENCOUNTER — Encounter: Payer: Self-pay | Admitting: *Deleted

## 2021-04-26 ENCOUNTER — Other Ambulatory Visit: Payer: Self-pay

## 2021-04-26 ENCOUNTER — Inpatient Hospital Stay: Payer: Medicare Other | Admitting: Hematology & Oncology

## 2021-04-26 VITALS — BP 145/68 | HR 71 | Temp 98.1°F | Resp 18 | Ht 67.0 in | Wt 153.0 lb

## 2021-04-26 DIAGNOSIS — C8510 Unspecified B-cell lymphoma, unspecified site: Secondary | ICD-10-CM | POA: Diagnosis not present

## 2021-04-26 DIAGNOSIS — D509 Iron deficiency anemia, unspecified: Secondary | ICD-10-CM | POA: Insufficient documentation

## 2021-04-26 DIAGNOSIS — Z888 Allergy status to other drugs, medicaments and biological substances status: Secondary | ICD-10-CM | POA: Diagnosis not present

## 2021-04-26 DIAGNOSIS — Z885 Allergy status to narcotic agent status: Secondary | ICD-10-CM | POA: Diagnosis not present

## 2021-04-26 DIAGNOSIS — Z9221 Personal history of antineoplastic chemotherapy: Secondary | ICD-10-CM | POA: Insufficient documentation

## 2021-04-26 DIAGNOSIS — C8339 Diffuse large B-cell lymphoma, extranodal and solid organ sites: Secondary | ICD-10-CM | POA: Insufficient documentation

## 2021-04-26 DIAGNOSIS — Z79899 Other long term (current) drug therapy: Secondary | ICD-10-CM | POA: Insufficient documentation

## 2021-04-26 LAB — CBC WITH DIFFERENTIAL (CANCER CENTER ONLY)
Abs Immature Granulocytes: 0.01 10*3/uL (ref 0.00–0.07)
Basophils Absolute: 0.1 10*3/uL (ref 0.0–0.1)
Basophils Relative: 2 %
Eosinophils Absolute: 0.1 10*3/uL (ref 0.0–0.5)
Eosinophils Relative: 2 %
HCT: 34.4 % — ABNORMAL LOW (ref 36.0–46.0)
Hemoglobin: 12 g/dL (ref 12.0–15.0)
Immature Granulocytes: 0 %
Lymphocytes Relative: 30 %
Lymphs Abs: 1.2 10*3/uL (ref 0.7–4.0)
MCH: 32.7 pg (ref 26.0–34.0)
MCHC: 34.9 g/dL (ref 30.0–36.0)
MCV: 93.7 fL (ref 80.0–100.0)
Monocytes Absolute: 0.5 10*3/uL (ref 0.1–1.0)
Monocytes Relative: 12 %
Neutro Abs: 2.1 10*3/uL (ref 1.7–7.7)
Neutrophils Relative %: 54 %
Platelet Count: 220 10*3/uL (ref 150–400)
RBC: 3.67 MIL/uL — ABNORMAL LOW (ref 3.87–5.11)
RDW: 13.2 % (ref 11.5–15.5)
WBC Count: 3.9 10*3/uL — ABNORMAL LOW (ref 4.0–10.5)
nRBC: 0 % (ref 0.0–0.2)

## 2021-04-26 LAB — CMP (CANCER CENTER ONLY)
ALT: 25 U/L (ref 0–44)
AST: 21 U/L (ref 15–41)
Albumin: 3.9 g/dL (ref 3.5–5.0)
Alkaline Phosphatase: 64 U/L (ref 38–126)
Anion gap: 7 (ref 5–15)
BUN: 19 mg/dL (ref 8–23)
CO2: 29 mmol/L (ref 22–32)
Calcium: 9.6 mg/dL (ref 8.9–10.3)
Chloride: 103 mmol/L (ref 98–111)
Creatinine: 0.71 mg/dL (ref 0.44–1.00)
GFR, Estimated: >60 mL/min (ref >60)
Glucose, Bld: 102 mg/dL — ABNORMAL HIGH (ref 70–99)
Potassium: 4.1 mmol/L (ref 3.5–5.1)
Sodium: 139 mmol/L (ref 135–145)
Total Bilirubin: 0.4 mg/dL (ref 0.3–1.2)
Total Protein: 6.4 g/dL — ABNORMAL LOW (ref 6.5–8.1)

## 2021-04-26 NOTE — Progress Notes (Signed)
Patient needs to be scheduled for direct admission on 05/02/2021 for MATRIX protocol.  Direct admission scheduled with Bed Placement for [facility]. Notification sent to the Inpatient Chemo Team.   Spoke to patient earlier prior to her MD visit. She is doing well and feels good. She has no questions or concerns.   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

## 2021-04-26 NOTE — Progress Notes (Signed)
Hematology and Oncology Follow Up Visit  Ariel [name] [id number] 1955-12-03 66 y.o. 04/26/2021   Principle Diagnosis:  Diffuse large cell non-Hodgkin's lymphoma-CNS involvement  Current Therapy:   Status post chemotherapy with R-ICE/R-MTV --  s/p cycle #4     Interim History:  Ariel [name] is comes in for follow-up.  She really doing nicely.  She had a wonderful birthday.  This was on February [birth year].  Her daughter came of the week afterwards.  That a wonderful time.  She really looks fantastic.  She feels well.  She is having no neurological issues.  She is having no problems with headache.  There is no visual changes.  The left knee is no longer swollen.  It was swollen in the hospital.  She had arthritis but we did our x-rays of the knees.  I am going to admit her to the hospital next week.  I want to use the MATRIX protocol.  I want to make sure that we try to focus on what ever might be in the brain.  Her last MRI showed that there might have been some evidence of progressive disease.  She has had no change in bowel or bladder habits.  She has had no mouth sores.  She has had no cough or shortness of breath.  She has had no rashes.  Overall, I would say performance status is ECOG 1.      Medications:  Current Outpatient Medications:    acyclovir (ZOVIRAX) 200 MG capsule, Take 1 capsule (200 mg total) by mouth 2 (two) times daily., Disp: 60 capsule, Rfl: 3   antiseptic oral rinse (BIOTENE) LIQD, 15 mLs by Mouth Rinse route every 4 (four) hours., Disp: 500 mL, Rfl: 4   docusate sodium (COLACE) 100 MG capsule, Take 1 capsule (100 mg total) by mouth 2 (two) times daily as needed for mild constipation., Disp: 10 capsule, Rfl: 0   Sodium Chloride-Sodium Bicarb (SODIUM BICARBONATE/SODIUM CHLORIDE) SOLN, 30 application by Mouth Rinse route every 4 (four) hours., Disp: 1000 mL, Rfl: 3   lidocaine-prilocaine (EMLA) cream, Apply to affected area once (Patient not taking: Reported on 01/14/2021),  Disp: 30 g, Rfl: 3  Allergies:  Allergies  Allergen Reactions   Codeine Nausea Only   Decadron [Dexamethasone]    Plavix [Clopidogrel]     Hives,lip swelling    Prednisone     Steroids caused psychological issues     Past Medical History, Surgical history, Social history, and Family History were reviewed and updated.  Review of Systems: Review of Systems  Constitutional: Negative.   HENT:  Negative.    Eyes: Negative.   Respiratory: Negative.    Cardiovascular: Negative.   Gastrointestinal: Negative.   Endocrine: Negative.   Genitourinary: Negative.    Musculoskeletal: Negative.   Skin: Negative.   Neurological: Negative.   Hematological: Negative.   Psychiatric/Behavioral: Negative.     Physical Exam:  height is 5\' 7"  (1.702 m) and weight is 153 lb (69.4 kg). Her oral temperature is 98.1 F (36.7 C). Her blood pressure is 145/68 (abnormal) and her pulse is 71. Her respiration is 18 and oxygen saturation is 99%.   Wt Readings from Last 3 Encounters:  04/26/21 153 lb (69.4 kg)  04/14/21 155 lb 10.3 oz (70.6 kg)  03/29/21 154 lb 1.9 oz (69.9 kg)    Physical Exam Vitals reviewed.  HENT:     Head: Normocephalic and atraumatic.  Eyes:     Pupils: Pupils are equal, round, and reactive to  light.  Cardiovascular:     Rate and Rhythm: Normal rate and regular rhythm.     Heart sounds: Normal heart sounds.  Pulmonary:     Effort: Pulmonary effort is normal.     Breath sounds: Normal breath sounds.  Abdominal:     General: Bowel sounds are normal.     Palpations: Abdomen is soft.  Musculoskeletal:        General: No tenderness or deformity. Normal range of motion.     Cervical back: Normal range of motion.  Lymphadenopathy:     Cervical: No cervical adenopathy.  Skin:    General: Skin is warm and dry.     Findings: No erythema or rash.  Neurological:     Mental Status: She is alert and oriented to person, place, and time.  Psychiatric:        Behavior: Behavior  normal.        Thought Content: Thought content normal.        Judgment: Judgment normal.     Lab Results  Component Value Date   WBC 3.9 (L) 04/26/2021   HGB 12.0 04/26/2021   HCT 34.4 (L) 04/26/2021   MCV 93.7 04/26/2021   PLT 220 04/26/2021     Chemistry      Component Value Date/Time   NA 139 04/26/2021 0940   K 4.1 04/26/2021 0940   CL 103 04/26/2021 0940   CO2 29 04/26/2021 0940   BUN 19 04/26/2021 0940   CREATININE 0.71 04/26/2021 0940   CREATININE 0.68 11/18/2012 1439      Component Value Date/Time   CALCIUM 9.6 04/26/2021 0940   ALKPHOS 64 04/26/2021 0940   AST 21 04/26/2021 0940   ALT 25 04/26/2021 0940   BILITOT 0.4 04/26/2021 0940      Impression and Plan: Ariel [name] is a very charming 66 year old white female.  She initially came to [place] with iron deficiency anemia.  We subsequently found that she had diffuse large cell non-Hodgkin's lymphoma.  It had traveled to her brain.  She has had 4 cycles of treatment.    Again, I will admit her next week for the MATRIX protocol.  I think she should do okay with this.  We will incorporate high-dose  Ara-C and see how this does with respect to her CNS disease.  I really do not see a problem with the MATRIX protocol.  I know our pharmacist, [name], is help me get this all set up for Ariel [name].  Have her come back to see [place] after she is discharged.  I am sure she probably will need colony-stimulating factors.    Volanda Napoleon, MD 2/28/202310:49 AM

## 2021-04-27 ENCOUNTER — Other Ambulatory Visit: Payer: Self-pay | Admitting: Pharmacist

## 2021-04-28 ENCOUNTER — Other Ambulatory Visit: Payer: Self-pay | Admitting: Hematology & Oncology

## 2021-04-28 DIAGNOSIS — C8510 Unspecified B-cell lymphoma, unspecified site: Secondary | ICD-10-CM

## 2021-04-29 ENCOUNTER — Other Ambulatory Visit: Payer: Medicare Other

## 2021-04-29 ENCOUNTER — Encounter: Payer: Self-pay | Admitting: Hematology & Oncology

## 2021-04-29 ENCOUNTER — Ambulatory Visit: Payer: Medicare Other | Admitting: Hematology & Oncology

## 2021-05-02 ENCOUNTER — Inpatient Hospital Stay (HOSPITAL_COMMUNITY)
Admission: AD | Admit: 2021-05-02 | Discharge: 2021-05-07 | DRG: 847 | Disposition: A | Discharge status: Home / Self Care | Payer: Medicare Other | Source: Ambulatory Visit | Attending: Hematology & Oncology | Admitting: Hematology & Oncology

## 2021-05-02 ENCOUNTER — Other Ambulatory Visit: Payer: Self-pay | Admitting: Hematology & Oncology

## 2021-05-02 ENCOUNTER — Encounter (HOSPITAL_COMMUNITY): Payer: Self-pay | Admitting: Hematology & Oncology

## 2021-05-02 ENCOUNTER — Other Ambulatory Visit: Payer: Self-pay

## 2021-05-02 DIAGNOSIS — Z79899 Other long term (current) drug therapy: Secondary | ICD-10-CM | POA: Diagnosis not present

## 2021-05-02 DIAGNOSIS — Z888 Allergy status to other drugs, medicaments and biological substances status: Secondary | ICD-10-CM

## 2021-05-02 DIAGNOSIS — Z8249 Family history of ischemic heart disease and other diseases of the circulatory system: Secondary | ICD-10-CM

## 2021-05-02 DIAGNOSIS — C8330 Diffuse large B-cell lymphoma, unspecified site: Secondary | ICD-10-CM | POA: Diagnosis not present

## 2021-05-02 DIAGNOSIS — Z885 Allergy status to narcotic agent status: Secondary | ICD-10-CM | POA: Diagnosis not present

## 2021-05-02 DIAGNOSIS — Z9049 Acquired absence of other specified parts of digestive tract: Secondary | ICD-10-CM

## 2021-05-02 DIAGNOSIS — E7800 Pure hypercholesterolemia, unspecified: Secondary | ICD-10-CM | POA: Diagnosis present

## 2021-05-02 DIAGNOSIS — Z85820 Personal history of malignant melanoma of skin: Secondary | ICD-10-CM | POA: Diagnosis not present

## 2021-05-02 DIAGNOSIS — C8332 Diffuse large B-cell lymphoma, intrathoracic lymph nodes: Secondary | ICD-10-CM

## 2021-05-02 DIAGNOSIS — Z5111 Encounter for antineoplastic chemotherapy: Principal | ICD-10-CM

## 2021-05-02 DIAGNOSIS — Z809 Family history of malignant neoplasm, unspecified: Secondary | ICD-10-CM | POA: Diagnosis not present

## 2021-05-02 DIAGNOSIS — C8331 Diffuse large B-cell lymphoma, lymph nodes of head, face, and neck: Secondary | ICD-10-CM

## 2021-05-02 DIAGNOSIS — Z8673 Personal history of transient ischemic attack (TIA), and cerebral infarction without residual deficits: Secondary | ICD-10-CM | POA: Diagnosis not present

## 2021-05-02 DIAGNOSIS — C8510 Unspecified B-cell lymphoma, unspecified site: Secondary | ICD-10-CM | POA: Diagnosis present

## 2021-05-02 LAB — TSH: TSH: 0.862 u[IU]/mL (ref 0.350–4.500)

## 2021-05-02 LAB — URINALYSIS, ROUTINE W REFLEX MICROSCOPIC
Bilirubin Urine: NEGATIVE
Glucose, UA: NEGATIVE mg/dL
Hgb urine dipstick: NEGATIVE
Ketones, ur: NEGATIVE mg/dL
Leukocytes,Ua: NEGATIVE
Nitrite: NEGATIVE
Protein, ur: NEGATIVE mg/dL
Specific Gravity, Urine: 1.009 (ref 1.005–1.030)
pH: 7 (ref 5.0–8.0)

## 2021-05-02 LAB — COMPREHENSIVE METABOLIC PANEL
ALT: 29 U/L (ref 0–44)
AST: 26 U/L (ref 15–41)
Albumin: 4.3 g/dL (ref 3.5–5.0)
Alkaline Phosphatase: 69 U/L (ref 38–126)
Anion gap: 6 (ref 5–15)
BUN: 13 mg/dL (ref 8–23)
CO2: 28 mmol/L (ref 22–32)
Calcium: 9.7 mg/dL (ref 8.9–10.3)
Chloride: 103 mmol/L (ref 98–111)
Creatinine, Ser: 0.66 mg/dL (ref 0.44–1.00)
GFR, Estimated: >60 mL/min (ref >60)
Glucose, Bld: 98 mg/dL (ref 70–99)
Potassium: 4.2 mmol/L (ref 3.5–5.1)
Sodium: 137 mmol/L (ref 135–145)
Total Bilirubin: 0.3 mg/dL (ref 0.3–1.2)
Total Protein: 7.1 g/dL (ref 6.5–8.1)

## 2021-05-02 LAB — CBC WITH DIFFERENTIAL/PLATELET
Abs Immature Granulocytes: 0 10*3/uL (ref 0.00–0.07)
Basophils Absolute: 0 10*3/uL (ref 0.0–0.1)
Basophils Relative: 1 %
Eosinophils Absolute: 0.1 10*3/uL (ref 0.0–0.5)
Eosinophils Relative: 1 %
HCT: 36.5 % (ref 36.0–46.0)
Hemoglobin: 12.5 g/dL (ref 12.0–15.0)
Immature Granulocytes: 0 %
Lymphocytes Relative: 36 %
Lymphs Abs: 1.7 10*3/uL (ref 0.7–4.0)
MCH: 32.1 pg (ref 26.0–34.0)
MCHC: 34.2 g/dL (ref 30.0–36.0)
MCV: 93.8 fL (ref 80.0–100.0)
Monocytes Absolute: 0.4 10*3/uL (ref 0.1–1.0)
Monocytes Relative: 10 %
Neutro Abs: 2.4 10*3/uL (ref 1.7–7.7)
Neutrophils Relative %: 52 %
Platelets: 182 10*3/uL (ref 150–400)
RBC: 3.89 MIL/uL (ref 3.87–5.11)
RDW: 12.9 % (ref 11.5–15.5)
WBC: 4.6 10*3/uL (ref 4.0–10.5)
nRBC: 0 % (ref 0.0–0.2)

## 2021-05-02 LAB — PHOSPHORUS: Phosphorus: 4.6 mg/dL (ref 2.5–4.6)

## 2021-05-02 LAB — MAGNESIUM: Magnesium: 2.1 mg/dL (ref 1.7–2.4)

## 2021-05-02 MED ORDER — SODIUM CHLORIDE 0.9 % IV SOLN: INTRAVENOUS | Status: DC

## 2021-05-02 MED ORDER — DIPHENHYDRAMINE HCL 50 MG/ML IJ SOLN
25.0000 mg | Freq: Once | INTRAMUSCULAR | Status: AC
  Administered 2021-05-02: 25 mg via INTRAVENOUS
  Filled 2021-05-02: qty 1

## 2021-05-02 MED ORDER — POTASSIUM CHLORIDE IN NACL 20-0.9 MEQ/L-% IV SOLN
INTRAVENOUS | Status: DC
  Filled 2021-05-02 (×7): qty 1000

## 2021-05-02 MED ORDER — ACETAMINOPHEN 325 MG PO TABS
650.0000 mg | ORAL_TABLET | Freq: Once | ORAL | Status: AC
  Administered 2021-05-02: 650 mg via ORAL
  Filled 2021-05-02: qty 2

## 2021-05-02 MED ORDER — DEXAMETHASONE SODIUM PHOSPHATE 0.1 % OP SOLN
2.0000 [drp] | Freq: Three times a day (TID) | OPHTHALMIC | Status: DC
  Administered 2021-05-02 – 2021-05-03 (×3): 2 [drp] via OPHTHALMIC
  Filled 2021-05-02: qty 5

## 2021-05-02 MED ORDER — SODIUM CHLORIDE 0.9 % IV SOLN
500.0000 mg/m2 | Freq: Once | INTRAVENOUS | Status: AC
  Administered 2021-05-02: 900 mg via INTRAVENOUS
  Filled 2021-05-02: qty 40

## 2021-05-02 MED ORDER — SODIUM BICARBONATE/SODIUM CHLORIDE MOUTHWASH
30.0000 "application " | OROMUCOSAL | Status: DC
  Administered 2021-05-02 – 2021-05-07 (×23): 30 via OROMUCOSAL
  Filled 2021-05-02: qty 2000

## 2021-05-02 MED ORDER — SODIUM CHLORIDE 0.9% FLUSH: 10.0000 mL | INTRAVENOUS | Status: DC | PRN

## 2021-05-02 MED ORDER — LIDOCAINE-PRILOCAINE 2.5-2.5 % EX CREA: TOPICAL_CREAM | Freq: Once | CUTANEOUS | Status: DC

## 2021-05-02 MED ORDER — SODIUM CHLORIDE 0.9% FLUSH
10.0000 mL | Freq: Two times a day (BID) | INTRAVENOUS | Status: DC
  Administered 2021-05-02 – 2021-05-07 (×9): 10 mL

## 2021-05-02 MED ORDER — SODIUM BICARBONATE 8.4 % IV SOLN
INTRAVENOUS | Status: DC
  Filled 2021-05-02: qty 1000
  Filled 2021-05-02: qty 150
  Filled 2021-05-02: qty 1000
  Filled 2021-05-02 (×3): qty 150
  Filled 2021-05-02 (×2): qty 1000
  Filled 2021-05-02: qty 150
  Filled 2021-05-02: qty 1000
  Filled 2021-05-02: qty 150
  Filled 2021-05-02: qty 1000
  Filled 2021-05-02: qty 150

## 2021-05-02 MED ORDER — ENOXAPARIN SODIUM 40 MG/0.4ML IJ SOSY
40.0000 mg | PREFILLED_SYRINGE | Freq: Every day | INTRAMUSCULAR | Status: DC
  Administered 2021-05-02 – 2021-05-06 (×5): 40 mg via SUBCUTANEOUS
  Filled 2021-05-02 (×5): qty 0.4

## 2021-05-02 MED ORDER — ACYCLOVIR 200 MG PO CAPS
200.0000 mg | ORAL_CAPSULE | Freq: Two times a day (BID) | ORAL | Status: DC
  Administered 2021-05-02 – 2021-05-07 (×10): 200 mg via ORAL
  Filled 2021-05-02 (×10): qty 1

## 2021-05-02 MED ORDER — CHLORHEXIDINE GLUCONATE CLOTH 2 % EX PADS
6.0000 | MEDICATED_PAD | Freq: Every day | CUTANEOUS | Status: DC
  Administered 2021-05-02 – 2021-05-06 (×5): 6 via TOPICAL

## 2021-05-02 MED ORDER — DOCUSATE SODIUM 100 MG PO CAPS
100.0000 mg | ORAL_CAPSULE | Freq: Two times a day (BID) | ORAL | Status: DC
  Administered 2021-05-02 – 2021-05-06 (×9): 100 mg via ORAL
  Filled 2021-05-02 (×9): qty 1

## 2021-05-02 MED ORDER — BIOTENE DRY MOUTH MT LIQD
15.0000 mL | OROMUCOSAL | Status: DC
  Administered 2021-05-02 – 2021-05-07 (×11): 15 mL via OROMUCOSAL

## 2021-05-02 NOTE — H&P (Addendum)
[place]  [phone] [phone]   MEDICAL ONCOLOGY - ADMISSION H&P  Reason for Admission: Diffuse large cell non-Hodgkin's lymphoma with CNS involvement, administration of systemic chemotherapy  HPI: 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  She has been receiving systemic chemotherapy with R-ICE/R-MTV with plans to begin the MATRIX protocol starting this admission.  The patient was seen this morning on the day of admission.  She is feeling well.  She denies mucositis, abdominal pain, nausea, vomiting, constipation, diarrhea.  She denies headaches.  She is trying to stay active and is walking on a regular basis.  No bleeding reported.  She is being admitted today to begin her next cycle of chemotherapy with the MATRIX protocol..  Past Medical History:  Diagnosis Date   Chicken pox    Complication of anesthesia    Per patient, very slow to wake up after anesthesia   Goals of care, counseling/discussion 12/22/2020   High cholesterol    High grade B-cell lymphoma ([place]) 12/22/2020   Hyperglycemia 11/10/2020   Melanoma ([place]) 2021   right hip   PONV (postoperative nausea and vomiting)    Urinary incontinence   :   Past Surgical History:  Procedure Laterality Date   AIR/FLUID EXCHANGE Right 12/07/2020   Procedure: AIR/FLUID EXCHANGE;  Surgeon: Jalene Mullet, MD;  Location: [facility];  Service: Ophthalmology;  Laterality: Right;   APPENDECTOMY  1962   BIOPSY  12/14/2020   Procedure: BIOPSY;  Surgeon: Rush Landmark Telford Nab., MD;  Location: [facility];  Service: Gastroenterology;;   ESOPHAGOGASTRODUODENOSCOPY (EGD) WITH PROPOFOL N/A 12/14/2020   Procedure:  ESOPHAGOGASTRODUODENOSCOPY (EGD) WITH PROPOFOL;  Surgeon: Irving Copas., MD;  Location: [facility];  Service: Gastroenterology;  Laterality: N/A;   EXCISION MELANOMA WITH SENTINEL LYMPH NODE BIOPSY Right 10/09/2019   Procedure: WIDE LOCAL EXCISION WITH ADVANCEMENT FLAP CLOSURE RIGHT HIP MELANOMA WITH SENTINEL LYMPH NODE BIOPSY;  Surgeon: Stark Klein, MD;  Location: [facility];  Service: General;  Laterality: Right;   FINE NEEDLE ASPIRATION  12/14/2020   Procedure: FINE NEEDLE ASPIRATION (FNA) LINEAR;  Surgeon: Irving Copas., MD;  Location: [facility];  Service: Gastroenterology;;   HERNIA REPAIR  2009   IR IMAGING GUIDED PORT INSERTION  12/30/2020   MOUTH SURGERY  11/2015   gum surgery and bone implant   PARS PLANA VITRECTOMY Right 12/07/2020   Procedure: PARS PLANA VITRECTOMY WITH 25 GAUGE;  Surgeon: Jalene Mullet, MD;  Location: [facility];  Service: Ophthalmology;  Laterality: Right;   PHOTOCOAGULATION WITH LASER Right 12/07/2020   Procedure: PHOTOCOAGULATION WITH ENDOLASER PANRITINAL COAGULATION;  Surgeon: Jalene Mullet, MD;  Location: [place];  Service: Ophthalmology;  Laterality: Right;   UPPER ESOPHAGEAL ENDOSCOPIC ULTRASOUND (EUS) N/A 12/14/2020   Procedure: UPPER ESOPHAGEAL ENDOSCOPIC ULTRASOUND (EUS);  Surgeon: Irving Copas., MD;  Location: [facility];  Service: Gastroenterology;  Laterality: N/A;   WISDOM TOOTH EXTRACTION    :   No current facility-administered medications for this encounter.   Current Outpatient Medications  Medication Sig Dispense Refill   acyclovir (ZOVIRAX) 200 MG capsule Take 1 capsule (200 mg total) by mouth 2 (two) times daily. 60 capsule 3   antiseptic oral rinse (BIOTENE) LIQD 15 mLs by Mouth Rinse route every 4 (four)  hours. 500 mL 4   docusate sodium (COLACE) 100 MG capsule Take 1 capsule (100 mg total) by mouth 2 (two) times daily as needed for mild constipation. 10 capsule 0   lidocaine-prilocaine (EMLA) cream Apply to  affected area once (Patient not taking: Reported on 01/14/2021) 30 g 3   Sodium Chloride-Sodium Bicarb (SODIUM BICARBONATE/SODIUM CHLORIDE) SOLN 30 application by Mouth Rinse route every 4 (four) hours. 1000 mL 3      Allergies  Allergen Reactions   Codeine Nausea Only   Decadron [Dexamethasone]    Plavix [Clopidogrel]     Hives,lip swelling    Prednisone     Steroids caused psychological issues   :   Family History  Problem Relation Age of Onset   Coronary artery disease Father        died at 3   COPD Father    Alcohol abuse Father    Diabetes Mellitus II Father    Coronary artery disease Sister    Cancer Sister        breast   Heart attack Brother    Hyperlipidemia Other        died 61- "natural causes"   Hyperlipidemia Other        4 siblings    Hypertension Other        3 siblings  :   Social History   Socioeconomic History   Marital status: Widowed    Spouse name: Not on file   Number of children: Not on file   Years of education: Not on file   Highest education level: Not on file  Occupational History   Occupation: retired  Tobacco Use   Smoking status: Never   Smokeless tobacco: Never  Vaping Use   Vaping Use: Never used  Substance and Sexual Activity   Alcohol use: Never   Drug use: Never   Sexual activity: Not Currently  Other Topics Concern   Not on file  Social History Narrative   Lives with husband   One Son- lives locally, 1 grand-daughter   Daughter- Sport and exercise psychologist. [place]   Completed Hotel manager school   Works as a Probation officer.         Social Determinants of Radio broadcast assistant Strain: Low Risk    Difficulty of Paying Living Expenses: Not hard at all  Food Insecurity: No Food Insecurity   Worried About Charity fundraiser in the Last Year: Never true   Arboriculturist in the Last Year: Never true  Transportation Needs: No Transportation Needs   Lack of Transportation (Medical): No   Lack of Transportation (Non-Medical): No   Physical Activity: Sufficiently Active   Days of Exercise per Week: 4 days   Minutes of Exercise per Session: 40 min  Stress: No Stress Concern Present   Feeling of Stress : Not at all  Social Connections: Moderately Isolated   Frequency of Communication with Friends and Family: More than three times a week   Frequency of Social Gatherings with Friends and Family: More than three times a week   Attends Religious Services: More than 4 times per year   Active Member of [employer] or Organizations: No   Attends Archivist Meetings: Never   Marital Status: Widowed  Human resources officer Violence: Not At Risk   Fear of Current or Ex-Partner: No   Emotionally Abused: No   Physically Abused: No   Sexually Abused: No  :  Review of Systems: A comprehensive 14  point review of systems was negative except as noted in the HPI.  Exam: No data found.   General:  well-nourished in no acute distress.   Eyes:  no scleral icterus.   ENT:  There were no oropharyngeal lesions.   Lymphatics:  Negative cervical, supraclavicular or axillary adenopathy.   Respiratory: lungs were clear bilaterally without wheezing or crackles.   Cardiovascular:  Regular rate and rhythm, S1/S2, without murmur, rub or gallop.  There was no pedal edema.   GI:  abdomen was soft, flat, nontender, nondistended, without organomegaly.    Skin exam was without echymosis, petichae.   Neuro exam was nonfocal. Patient was alert and oriented.  Attention was good.   Language was appropriate.  Mood was normal without depression.  Speech was not pressured.  Thought content was not tangential.     Lab Results  Component Value Date   WBC 3.9 (L) 04/26/2021   HGB 12.0 04/26/2021   HCT 34.4 (L) 04/26/2021   PLT 220 04/26/2021   GLUCOSE 102 (H) 04/26/2021   CHOL 134 10/07/2020   TRIG 36 10/07/2020   HDL 44 10/07/2020   LDLCALC 83 10/07/2020   ALT 25 04/26/2021   AST 21 04/26/2021   NA 139 04/26/2021   K 4.1 04/26/2021   CL 103  04/26/2021   CREATININE 0.71 04/26/2021   BUN 19 04/26/2021   CO2 29 04/26/2021    X-ray chest PA and lateral  Result Date: 04/04/2021 CLINICAL DATA:  History of lymphoma. EXAM: CHEST - 2 VIEW COMPARISON:  CT chest dated February 08, 2021. Chest x-ray dated January 14, 2021. FINDINGS: Unchanged right chest wall port catheter. The heart size and mediastinal contours are within normal limits. Normal pulmonary vascularity. No focal consolidation, pleural effusion, or pneumothorax. No acute osseous abnormality. IMPRESSION: 1. No acute cardiopulmonary disease. Electronically Signed   By: Titus Dubin M.D.   On: 04/04/2021 13:59   MR BRAIN W WO CONTRAST  Result Date: 04/08/2021 CLINICAL DATA:  66 year old female with history of diffuse large cell non-Hodgkin's lymphoma with CNS involvement, now with left lower extremity spasms. Evaluate for treatment response EXAM: MRI HEAD WITHOUT AND WITH CONTRAST TECHNIQUE: Multiplanar, multiecho pulse sequences of the brain and surrounding structures were obtained without and with intravenous contrast. CONTRAST:  54m GADAVIST GADOBUTROL 1 MMOL/ML IV SOLN COMPARISON:  Comparison made with previous brain MRI from 03/11/2021 as well as multiple previous exams. FINDINGS: Brain: Cerebral volume stable, and remains within normal limits for age. Scattered T2/FLAIR hyperintensity within the periventricular white matter noted, stable from prior, and could reflect chronic microvascular ischemic disease and/or post treatment changes. Few scattered foci of chronic blood products noted as well, also stable. No evidence for acute or interval infarction. Previously identified left posterior temporal lesion again seen. Associated mild serpiginous enhancement appears mildly decreased from prior. However, multiple new scattered foci of T2/FLAIR signal abnormality are seen elsewhere involving the bilateral cerebral hemispheres. The most prominent focus within the right cerebral hemisphere  involves the cortical/subcortical region at the anterior right frontal lobe (series 15, image 27). This area now measures up to approximately 3.2 cm in size, previously 1.2 cm. The most prominent area of involvement within the left cerebral hemisphere is seen centered at the posterior limb of the left internal capsule, extending into the left cerebral peduncle and midbrain (series 15, images 25, 23). Multiple new and/or increased patchy foci seen involving the para falcine regions bilaterally (series 15, image 42). Specific note made of patchy involvement  at the posterior right ACA distribution, potentially explaining the patient's left lower extremity symptoms (series 15, image 42). Associated irregular patchy post-contrast enhancement seen about several of these areas (series 20, image 84 for example). Overall, findings are consistent with interval progression of disease. Vascular: Major intracranial vascular flow voids are maintained. Skull and upper cervical spine: Craniocervical junction within normal limits. Heterogeneous and diffusely decreased T1 signal intensity seen throughout the visualized bone marrow, consistent with history of anemia and lymphoproliferative disorder. No focal marrow replacing lesion. No scalp soft tissue abnormality. Sinuses/Orbits: Globes orbital soft tissues within normal limits. Mild scattered mucosal thickening noted throughout the paranasal sinuses. No air-fluid levels to suggest acute sinusitis. No significant mastoid effusion. Other: None. IMPRESSION: Multiple new and/or increased scattered areas of T2/FLAIR signal abnormality involving the bilateral cerebral hemispheres as above, several of which demonstrate irregular postcontrast enhancement. Findings consistent with interval progression of disease. Specific note is made of patchy involvement of the posterior right ACA territory, which could explain the patient's left lower extremity symptoms. Electronically Signed   By:  Jeannine Boga M.D.   On: 04/08/2021 02:45   DG Knee Complete 4 Views Left  Result Date: 04/11/2021 CLINICAL DATA:  Bilateral knee pain, left-greater-than-right. Swelling. EXAM: LEFT KNEE - COMPLETE 4+ VIEW; RIGHT KNEE - COMPLETE 4+ VIEW COMPARISON:  None. FINDINGS: Left knee: Moderate to severe inferior patellofemoral joint space narrowing. Moderate superior greater than inferior patellar degenerative osteophytosis. Moderate joint effusion. Mild peripheral lateral compartment degenerative spurring. Right knee: Mild peripheral medial and lateral compartment degenerative spurring. Severe patellofemoral joint space narrowing with diffuse bone-on-bone contact. No joint effusion. Mild superior patellar degenerative osteophytosis. No acute fracture or dislocation. IMPRESSION: Moderate to severe left and severe right patellofemoral osteoarthritis. Electronically Signed   By: Yvonne Kendall M.D.   On: 04/11/2021 13:29   DG Knee Complete 4 Views Right  Result Date: 04/11/2021 CLINICAL DATA:  Bilateral knee pain, left-greater-than-right. Swelling. EXAM: LEFT KNEE - COMPLETE 4+ VIEW; RIGHT KNEE - COMPLETE 4+ VIEW COMPARISON:  None. FINDINGS: Left knee: Moderate to severe inferior patellofemoral joint space narrowing. Moderate superior greater than inferior patellar degenerative osteophytosis. Moderate joint effusion. Mild peripheral lateral compartment degenerative spurring. Right knee: Mild peripheral medial and lateral compartment degenerative spurring. Severe patellofemoral joint space narrowing with diffuse bone-on-bone contact. No joint effusion. Mild superior patellar degenerative osteophytosis. No acute fracture or dislocation. IMPRESSION: Moderate to severe left and severe right patellofemoral osteoarthritis. Electronically Signed   By: Yvonne Kendall M.D.   On: 04/11/2021 13:29   VAS [place] LOWER EXTREMITY VENOUS (DVT)  Result Date: 04/13/2021  Lower Venous DVT Study Patient Name:  Ariel [name]  Date of  Exam:   04/13/2021 Medical Rec #: [id number]     Accession #:    [id number] Date of Birth: 06/09/1955     Patient Gender: F Patient Age:   60 years Exam [facility] Procedure:      VAS [place] LOWER EXTREMITY VENOUS (DVT) Referring Phys: Collier Salina Anshu Wehner --------------------------------------------------------------------------------  Indications: LLE knee pain and swelling.  Risk Factors: CA patient on chemotherapy. Comparison Study: Previous exam on 02/08/2021 was negative for DVT Performing Technologist: Rogelia Rohrer RVT, RDMS  Examination Guidelines: A complete evaluation includes B-mode imaging, spectral Doppler, color Doppler, and power Doppler as needed of all accessible portions of each vessel. Bilateral testing is considered an integral part of a complete examination. Limited examinations for reoccurring indications may be performed as noted. The reflux portion of the exam is performed  with the patient in reverse Trendelenburg.  +-----+---------------+---------+-----------+----------+--------------+  RIGHT Compressibility Phasicity Spontaneity Properties Thrombus Aging  +-----+---------------+---------+-----------+----------+--------------+  CFV   Full            Yes       Yes                                    +-----+---------------+---------+-----------+----------+--------------+   +---------+---------------+---------+-----------+----------+--------------+  LEFT      Compressibility Phasicity Spontaneity Properties Thrombus Aging  +---------+---------------+---------+-----------+----------+--------------+  CFV       Full            Yes       Yes                                    +---------+---------------+---------+-----------+----------+--------------+  SFJ       Full                                                             +---------+---------------+---------+-----------+----------+--------------+  FV Prox   Full            Yes       Yes                                     +---------+---------------+---------+-----------+----------+--------------+  FV Mid    Full            Yes       Yes                                    +---------+---------------+---------+-----------+----------+--------------+  FV Distal Full            Yes       Yes                                    +---------+---------------+---------+-----------+----------+--------------+  PFV       Full                                                             +---------+---------------+---------+-----------+----------+--------------+  POP       Full            Yes       Yes                                    +---------+---------------+---------+-----------+----------+--------------+  PTV       Full                                                             +---------+---------------+---------+-----------+----------+--------------+  PERO      Full                                                             +---------+---------------+---------+-----------+----------+--------------+     Summary: RIGHT: - No evidence of common femoral vein obstruction.  LEFT: - There is no evidence of deep vein thrombosis in the lower extremity. - There is no evidence of superficial venous thrombosis.  - A small cystic structure is found in the popliteal fossa (2.31 x 0.95 x 1.37 cm).  *See table(s) above for measurements and observations. Electronically signed by Harold Barban MD on 04/13/2021 at 7:39:01 PM.    Final      X-ray chest PA and lateral  Result Date: 04/04/2021 CLINICAL DATA:  History of lymphoma. EXAM: CHEST - 2 VIEW COMPARISON:  CT chest dated February 08, 2021. Chest x-ray dated January 14, 2021. FINDINGS: Unchanged right chest wall port catheter. The heart size and mediastinal contours are within normal limits. Normal pulmonary vascularity. No focal consolidation, pleural effusion, or pneumothorax. No acute osseous abnormality. IMPRESSION: 1. No acute cardiopulmonary disease. Electronically Signed   By: Titus Dubin  M.D.   On: 04/04/2021 13:59   MR BRAIN W WO CONTRAST  Result Date: 04/08/2021 CLINICAL DATA:  66 year old female with history of diffuse large cell non-Hodgkin's lymphoma with CNS involvement, now with left lower extremity spasms. Evaluate for treatment response EXAM: MRI HEAD WITHOUT AND WITH CONTRAST TECHNIQUE: Multiplanar, multiecho pulse sequences of the brain and surrounding structures were obtained without and with intravenous contrast. CONTRAST:  6m GADAVIST GADOBUTROL 1 MMOL/ML IV SOLN COMPARISON:  Comparison made with previous brain MRI from 03/11/2021 as well as multiple previous exams. FINDINGS: Brain: Cerebral volume stable, and remains within normal limits for age. Scattered T2/FLAIR hyperintensity within the periventricular white matter noted, stable from prior, and could reflect chronic microvascular ischemic disease and/or post treatment changes. Few scattered foci of chronic blood products noted as well, also stable. No evidence for acute or interval infarction. Previously identified left posterior temporal lesion again seen. Associated mild serpiginous enhancement appears mildly decreased from prior. However, multiple new scattered foci of T2/FLAIR signal abnormality are seen elsewhere involving the bilateral cerebral hemispheres. The most prominent focus within the right cerebral hemisphere involves the cortical/subcortical region at the anterior right frontal lobe (series 15, image 27). This area now measures up to approximately 3.2 cm in size, previously 1.2 cm. The most prominent area of involvement within the left cerebral hemisphere is seen centered at the posterior limb of the left internal capsule, extending into the left cerebral peduncle and midbrain (series 15, images 25, 23). Multiple new and/or increased patchy foci seen involving the para falcine regions bilaterally (series 15, image 42). Specific note made of patchy involvement at the posterior right ACA distribution,  potentially explaining the patient's left lower extremity symptoms (series 15, image 42). Associated irregular patchy post-contrast enhancement seen about several of these areas (series 20, image 84 for example). Overall, findings are consistent with interval progression of disease. Vascular: Major intracranial vascular flow voids are maintained. Skull and upper cervical spine: Craniocervical junction within normal limits. Heterogeneous and diffusely decreased T1 signal intensity seen throughout the visualized bone marrow, consistent with history of anemia and lymphoproliferative disorder. No focal marrow replacing lesion. No scalp  soft tissue abnormality. Sinuses/Orbits: Globes orbital soft tissues within normal limits. Mild scattered mucosal thickening noted throughout the paranasal sinuses. No air-fluid levels to suggest acute sinusitis. No significant mastoid effusion. Other: None. IMPRESSION: Multiple new and/or increased scattered areas of T2/FLAIR signal abnormality involving the bilateral cerebral hemispheres as above, several of which demonstrate irregular postcontrast enhancement. Findings consistent with interval progression of disease. Specific note is made of patchy involvement of the posterior right ACA territory, which could explain the patient's left lower extremity symptoms. Electronically Signed   By: Jeannine Boga M.D.   On: 04/08/2021 02:45   DG Knee Complete 4 Views Left  Result Date: 04/11/2021 CLINICAL DATA:  Bilateral knee pain, left-greater-than-right. Swelling. EXAM: LEFT KNEE - COMPLETE 4+ VIEW; RIGHT KNEE - COMPLETE 4+ VIEW COMPARISON:  None. FINDINGS: Left knee: Moderate to severe inferior patellofemoral joint space narrowing. Moderate superior greater than inferior patellar degenerative osteophytosis. Moderate joint effusion. Mild peripheral lateral compartment degenerative spurring. Right knee: Mild peripheral medial and lateral compartment degenerative spurring. Severe  patellofemoral joint space narrowing with diffuse bone-on-bone contact. No joint effusion. Mild superior patellar degenerative osteophytosis. No acute fracture or dislocation. IMPRESSION: Moderate to severe left and severe right patellofemoral osteoarthritis. Electronically Signed   By: Yvonne Kendall M.D.   On: 04/11/2021 13:29   DG Knee Complete 4 Views Right  Result Date: 04/11/2021 CLINICAL DATA:  Bilateral knee pain, left-greater-than-right. Swelling. EXAM: LEFT KNEE - COMPLETE 4+ VIEW; RIGHT KNEE - COMPLETE 4+ VIEW COMPARISON:  None. FINDINGS: Left knee: Moderate to severe inferior patellofemoral joint space narrowing. Moderate superior greater than inferior patellar degenerative osteophytosis. Moderate joint effusion. Mild peripheral lateral compartment degenerative spurring. Right knee: Mild peripheral medial and lateral compartment degenerative spurring. Severe patellofemoral joint space narrowing with diffuse bone-on-bone contact. No joint effusion. Mild superior patellar degenerative osteophytosis. No acute fracture or dislocation. IMPRESSION: Moderate to severe left and severe right patellofemoral osteoarthritis. Electronically Signed   By: Yvonne Kendall M.D.   On: 04/11/2021 13:29   VAS [place] LOWER EXTREMITY VENOUS (DVT)  Result Date: 04/13/2021  Lower Venous DVT Study Patient Name:  Ariel [name]  Date of Exam:   04/13/2021 Medical Rec #: [id number]     Accession #:    [id number] Date of Birth: [birth year]     Patient Gender: F Patient Age:   18 years Exam Location:  [facility] Procedure:      VAS [place] LOWER EXTREMITY VENOUS (DVT) Referring Phys: Collier Salina Dayanis Bergquist --------------------------------------------------------------------------------  Indications: LLE knee pain and swelling.  Risk Factors: CA patient on chemotherapy. Comparison Study: Previous exam on 02/08/2021 was negative for DVT Performing Technologist: Rogelia Rohrer RVT, RDMS  Examination Guidelines: A complete evaluation includes B-mode  imaging, spectral Doppler, color Doppler, and power Doppler as needed of all accessible portions of each vessel. Bilateral testing is considered an integral part of a complete examination. Limited examinations for reoccurring indications may be performed as noted. The reflux portion of the exam is performed with the patient in reverse Trendelenburg.  +-----+---------------+---------+-----------+----------+--------------+  RIGHT Compressibility Phasicity Spontaneity Properties Thrombus Aging  +-----+---------------+---------+-----------+----------+--------------+  CFV   Full            Yes       Yes                                    +-----+---------------+---------+-----------+----------+--------------+   +---------+---------------+---------+-----------+----------+--------------+  LEFT      Compressibility Phasicity  Spontaneity Properties Thrombus Aging  +---------+---------------+---------+-----------+----------+--------------+  CFV       Full            Yes       Yes                                    +---------+---------------+---------+-----------+----------+--------------+  SFJ       Full                                                             +---------+---------------+---------+-----------+----------+--------------+  FV Prox   Full            Yes       Yes                                    +---------+---------------+---------+-----------+----------+--------------+  FV Mid    Full            Yes       Yes                                    +---------+---------------+---------+-----------+----------+--------------+  FV Distal Full            Yes       Yes                                    +---------+---------------+---------+-----------+----------+--------------+  PFV       Full                                                             +---------+---------------+---------+-----------+----------+--------------+  POP       Full            Yes       Yes                                     +---------+---------------+---------+-----------+----------+--------------+  PTV       Full                                                             +---------+---------------+---------+-----------+----------+--------------+  PERO      Full                                                             +---------+---------------+---------+-----------+----------+--------------+  Summary: RIGHT: - No evidence of common femoral vein obstruction.  LEFT: - There is no evidence of deep vein thrombosis in the lower extremity. - There is no evidence of superficial venous thrombosis.  - A small cystic structure is found in the popliteal fossa (2.31 x 0.95 x 1.37 cm).  *See table(s) above for measurements and observations. Electronically signed by Harold Barban MD on 04/13/2021 at 7:39:01 PM.    Final     Assessment and Plan:  1.  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  She feels well and we will plan to begin systemic chemotherapy with the MATRIX protocol today. -Obtain baseline lab work including CBC, CMET, magnesium, phosphorus, TSH, UA. -Begin normal saline with 20 mEq of potassium chloride at 50 cc/h. -Lovenox for DVT prophylaxis. -Regular diet. -Continue home acyclovir 200 mg twice daily. -We will add Colace 100 mg twice a day to prevent constipation. -Continue Biotene mouth rinse and sodium bicarb mouth rinse to prevent mouth sores. -Neurochecks every 4 hours due to cytarabine administration.  Mikey Bussing, DNP, AGPCNP-BC, AOCNP   ADDENDUM: I agree with the above assessment.  We are going to have her on the MATRIX protocol.  This will incorporate high-dose air-C with a high-dose methotrexate.  Again I worry about what is going on in the brain.  She does not have any count of symptoms.  We will use Decadron eyedrops.  I will see this as a problem.  I really do not think these are can be absorbed by the body and caused her to  have issues with change in mental state.  We will continue her on her home medications.  She will be on the acyclovir.  We will have her on normal saline but will make sure that there is sodium bicarb.  We will have to monitor her methotrexate levels when she completes the methotrexate.  Hopefully, we will be able to start treatment tomorrow.  I suspect she will be hospitalized for at least a week.  I do appreciate the great care that she will get from all the staff up on 6 E.  [name], MD  Psalms 34;4

## 2021-05-02 NOTE — TOC Initial Note (Signed)
Transition of Care (TOC) - Initial/Assessment Note  ? ? ?Patient Details  ?Name: Ariel [name] ?MRN: [id number] ?Date of Birth: [birth year] ? ?Transition of Care (TOC) CM/SW Contact:    ?Nichlos Kunzler, Marjie Skiff, RN ?Phone Number: ?05/02/2021, 2:25 PM ? ?              ? ?Patient Details  ?Name: Ariel [name] ?Date of Birth: Apr 12, 1955 ? ? ?Transition of Care (TOC) CM/SW Contact:    ?Esaiah Wanless, Marjie Skiff, RN ?Phone Number: ?05/02/2021, 2:28 PM ? ? ? ?Transition of Care Department [facility]) has reviewed patient and no TOC needs have been identified at this time. We will continue to monitor patient advancement through interdisciplinary progression rounds. If new patient transition needs arise, please place a TOC consult. ?  ?  ?   ? ?Activities of Daily Living ?Home Assistive Devices/Equipment: Grab bars in shower, Grab bars around toilet, Raised toilet seat with rails, Bedside commode/3-in-1 ?ADL Screening (condition at time of admission) ?Patient's cognitive ability adequate to safely complete daily activities?: Yes ?Is the patient deaf or have difficulty hearing?: No ?Does the patient have difficulty seeing, even when wearing glasses/contacts?: No ?Does the patient have difficulty concentrating, remembering, or making decisions?: No ?Patient able to express need for assistance with ADLs?: Yes ?Does the patient have difficulty dressing or bathing?: No ?Independently performs ADLs?: Yes (appropriate for developmental age) ?Does the patient have difficulty walking or climbing stairs?: No ?Weakness of Legs: None ?Weakness of Arms/Hands: None ? ? 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 Diagnosis Date Noted  ? 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 Diffuse large B cell lymphoma ([place]) 03/07/2021  ? Encounter for antineoplastic chemotherapy 03/07/2021  ? AMS (altered mental status) 01/14/2021  ? Neutropenia ([place]) 01/14/2021  ? 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 Goals of care, counseling/discussion 12/22/2020  ? Acute metabolic encephalopathy 70/26/3785  ? Brain metastasis ([place]) 12/09/2020  ? Brain tumor ([place]) 12/08/2020  ? Hyponatremia 12/08/2020  ? Night sweat 11/30/2020  ? Chicken pox 11/22/2020  ? Complication of anesthesia 11/22/2020  ? PONV (postoperative nausea and vomiting) 11/22/2020  ? Skin rash 11/10/2020  ? Tachycardia 11/10/2020  ? Anemia 11/10/2020  ? Hyperglycemia 11/10/2020  ? TIA (transient ischemic attack) 10/07/2020  ? Iron deficiency anemia 10/07/2020  ? Aphasia 10/06/2020  ? Melanoma ([place]) 2021  ? Multinodular goiter 12/22/2013  ? Subclinical hyperthyroidism 12/19/2013  ? Urinary incontinence 11/19/2013  ? Hyperlipidemia 11/19/2012  ? Routine general medical examination at a health care facility 11/18/2012  ? ?PCP:  [name], NP ?Pharmacy:   ?[facility] ?[place] [address] ?Phone: [phone] Fax: [phone] ? ? ? ? ?Social Determinants of Health (SDOH) Interventions ?  ? ?Readmission Risk Interventions ?Readmission Risk Prevention Plan 05/02/2021 04/13/2021 03/08/2021  ?Transportation Screening Complete Complete Complete  ?PCP or Specialist Appt within 3-5 Days Complete Complete Complete  ?[place] or Home Care Consult Complete Complete Complete  ?Social Work Consult for [place] Planning/Counseling Complete Complete Complete  ?Palliative Care Screening Not Applicable Not Applicable Not Applicable  ?Medication Review Press photographer) Complete Complete Complete  ?Some recent data might be hidden  ? ? ? ?

## 2021-05-02 NOTE — Discharge Planning (Signed)
Oncology Discharge Planning Admission Note ? ?[facility] at [facility] ?Address: [address], [place], [place] [address] ?Hours of Operation:  8am - 5pm, Monday - Friday  ?Clinic Contact Information:  [phone]) [phone] ? ?Oncology Care Team: ?Medical Oncologist:  Dr. Burney Gauze ? ?Dr. Marin Olp and Myrtha Mantis - NP are aware of this hospital admission dated 05/02/21 and have assessed patient at bedside. The cancer center will follow [place]?s inpatient care to assist with discharge planning as indicated by the oncologist.  We will arrange for necessary outpatient follow up closer to discharge. ? ?Disclaimer:  This [place] note does not imply a formal consult request has been made by the admitting attending for this admission or there will be an inpatient consult completed by oncology.  Please request oncology consults as per standard process as indicated. ?

## 2021-05-03 ENCOUNTER — Encounter: Payer: Self-pay | Admitting: *Deleted

## 2021-05-03 ENCOUNTER — Encounter: Payer: Self-pay | Admitting: Hematology & Oncology

## 2021-05-03 DIAGNOSIS — C8330 Diffuse large B-cell lymphoma, unspecified site: Secondary | ICD-10-CM | POA: Diagnosis not present

## 2021-05-03 LAB — COMPREHENSIVE METABOLIC PANEL
ALT: 25 U/L (ref 0–44)
AST: 23 U/L (ref 15–41)
Albumin: 3.7 g/dL (ref 3.5–5.0)
Alkaline Phosphatase: 63 U/L (ref 38–126)
Anion gap: 7 (ref 5–15)
BUN: 10 mg/dL (ref 8–23)
CO2: 32 mmol/L (ref 22–32)
Calcium: 9.1 mg/dL (ref 8.9–10.3)
Chloride: 100 mmol/L (ref 98–111)
Creatinine, Ser: 0.63 mg/dL (ref 0.44–1.00)
GFR, Estimated: >60 mL/min (ref >60)
Glucose, Bld: 129 mg/dL — ABNORMAL HIGH (ref 70–99)
Potassium: 3.4 mmol/L — ABNORMAL LOW (ref 3.5–5.1)
Sodium: 139 mmol/L (ref 135–145)
Total Bilirubin: 0.3 mg/dL (ref 0.3–1.2)
Total Protein: 6.2 g/dL — ABNORMAL LOW (ref 6.5–8.1)

## 2021-05-03 LAB — URINALYSIS, ROUTINE W REFLEX MICROSCOPIC
Bilirubin Urine: NEGATIVE
Glucose, UA: NEGATIVE mg/dL
Hgb urine dipstick: NEGATIVE
Ketones, ur: NEGATIVE mg/dL
Leukocytes,Ua: NEGATIVE
Nitrite: NEGATIVE
Protein, ur: NEGATIVE mg/dL
Specific Gravity, Urine: 1.004 — ABNORMAL LOW (ref 1.005–1.030)
pH: 9 — ABNORMAL HIGH (ref 5.0–8.0)

## 2021-05-03 MED ORDER — SODIUM CHLORIDE 0.9 % IV SOLN: 3.0000 g/m2 | Freq: Once | INTRAVENOUS | Status: DC

## 2021-05-03 MED ORDER — METHOTREXATE SODIUM (PF) CHEMO INJECTION 1 GM/40ML
500.0000 mg/m2 | Freq: Once | INTRAVENOUS | Status: AC
  Administered 2021-05-03: 905 mg via INTRAVENOUS
  Filled 2021-05-03: qty 36.2

## 2021-05-03 MED ORDER — DEXAMETHASONE SODIUM PHOSPHATE 0.1 % OP SOLN
2.0000 [drp] | Freq: Four times a day (QID) | OPHTHALMIC | Status: DC
  Administered 2021-05-03 – 2021-05-07 (×16): 2 [drp] via OPHTHALMIC
  Filled 2021-05-03: qty 5

## 2021-05-03 MED ORDER — SODIUM CHLORIDE 0.9 % IV SOLN: 500.0000 mg/m2 | Freq: Once | INTRAVENOUS | Status: DC

## 2021-05-03 MED ORDER — SODIUM CHLORIDE 0.9 % IV SOLN
Freq: Once | INTRAVENOUS | Status: AC
  Administered 2021-05-03: 16 mg via INTRAVENOUS
  Filled 2021-05-03: qty 8

## 2021-05-03 MED ORDER — METHOTREXATE SODIUM (PF) CHEMO INJECTION 1 GM/40ML
3.0000 g/m2 | Freq: Once | INTRAVENOUS | Status: AC
  Administered 2021-05-03: 5.43 g via INTRAVENOUS
  Filled 2021-05-03: qty 200

## 2021-05-03 NOTE — Progress Notes (Signed)
Ariel [name] is doing well.  She had no problems associated with the Rituxan.  She gets her methotrexate today.  We will see what her urine looks like.  As always, pharmacy will do a wonderful job in helping to manage the methotrexate levels. ? ?She is eating well.  She is having no abdominal pain.  She is having no headache.  She is having no cough or shortness of breath.  There is no diarrhea.  She does have arthritis in her knees but there is no swelling in the left knee. ? ?Her last a show potassium 3.4.  BUN is 10 creatinine 0.63.  Calcium 9.1.  Her albumin is 3.7. ? ?Her vital signs show temperature 98.1.  Pulse 77.  Blood pressure 118/59.  Her head and neck exam shows no scleral icterus.  There is no oral lesions.  She has no adenopathy in the neck.  Lungs are clear bilaterally.  Cardiac exam regular rate and rhythm.  Abdomen is soft.  Bowel sounds are present.  She has no palpable fluid wave.  There is no palpable liver or spleen tip.  Extremity shows no clubbing, cyanosis or edema.  She had little bit of swelling over in the left knee.  Neurological exam is nonfocal. ? ?[name] will have day 2 of her MATRIX protocol.  She will get the high-dose methotrexate today.  She will then have the high-dose air-seen tomorrow.  She is getting her Decadron eyedrops already. ? ?We will have to see what her labs are.  We will have to make sure that her urine is alkalized for the methotrexate.  So far, in the past, she has had no problems with the high-dose methotrexate.  She is cleared the methotrexate pretty well. ? ?I know that she is getting fantastic care from all the staff up on 6 E.  I do appreciate all other help and compassion. ? ?[name], MD ? ?Romans 8:18 ?

## 2021-05-03 NOTE — Progress Notes (Signed)
Patient successfully admitted for her next treatment cycle. Will follow up for post discharge needs and office follow up.  ? ?Oncology Nurse Navigator Documentation ? ?Oncology Nurse Navigator Flowsheets 05/03/2021  ?Abnormal Finding Date -  ?Confirmed Diagnosis Date -  ?Phase of Treatment -  ?Chemotherapy Actual Start Date: -  ?Navigator Follow Up Date: 05/09/2021  ?Navigator Follow Up Reason: Appointment Review  ?Navigator Location [facility]  ?Navigator Encounter Type Appt/Treatment Plan Review  ?Telephone -  ?Treatment Initiated Date -  ?Patient Visit Type MedOnc  ?Treatment Phase Active Tx  ?Barriers/Navigation Needs Coordination of Care;Education  ?Education -  ?Interventions None Required  ?Acuity Level 2-Minimal Needs (1-2 Barriers Identified)  ?Referrals -  ?Coordination of Care -  ?Education Method -  ?Support Groups/Services Friends and Family  ?Time Spent with Patient 15  ?  ?

## 2021-05-03 NOTE — Progress Notes (Addendum)
Pharmacy Brief Note - MTX Monitoring ? ?Confirmed MTX monitoring plan with Dr. Marin Olp. ? ?MTX infusion started 3/7 @ 1158.  1st MTX level will be drawn on 3/8 @ 1200 (24h after initiation of infusion).  Subsequent levels to be drawn with AM labs on 3/9.   ? ?Leucovorin '28mg'$  ('15mg'$ /m2) IV q6 hours ordered to begin on 3/8 and will need to be scheduled to start at 1200, which is 24 hours after the initiation of MTX infusion. Orders will be released 3/8 AM.  ? ?Dimple Nanas, PharmD ?05/03/2021 12:39 PM ? ?

## 2021-05-04 ENCOUNTER — Encounter: Payer: Self-pay | Admitting: Hematology & Oncology

## 2021-05-04 DIAGNOSIS — C8330 Diffuse large B-cell lymphoma, unspecified site: Secondary | ICD-10-CM | POA: Diagnosis not present

## 2021-05-04 LAB — COMPREHENSIVE METABOLIC PANEL
ALT: 45 U/L — ABNORMAL HIGH (ref 0–44)
AST: 33 U/L (ref 15–41)
Albumin: 3.5 g/dL (ref 3.5–5.0)
Alkaline Phosphatase: 61 U/L (ref 38–126)
Anion gap: 6 (ref 5–15)
BUN: 13 mg/dL (ref 8–23)
CO2: 33 mmol/L — ABNORMAL HIGH (ref 22–32)
Calcium: 9.1 mg/dL (ref 8.9–10.3)
Chloride: 97 mmol/L — ABNORMAL LOW (ref 98–111)
Creatinine, Ser: 0.59 mg/dL (ref 0.44–1.00)
GFR, Estimated: >60 mL/min (ref >60)
Glucose, Bld: 99 mg/dL (ref 70–99)
Potassium: 3.4 mmol/L — ABNORMAL LOW (ref 3.5–5.1)
Sodium: 136 mmol/L (ref 135–145)
Total Bilirubin: 0.5 mg/dL (ref 0.3–1.2)
Total Protein: 5.9 g/dL — ABNORMAL LOW (ref 6.5–8.1)

## 2021-05-04 LAB — URINALYSIS, ROUTINE W REFLEX MICROSCOPIC
Bilirubin Urine: NEGATIVE
Glucose, UA: NEGATIVE mg/dL
Hgb urine dipstick: NEGATIVE
Ketones, ur: NEGATIVE mg/dL
Leukocytes,Ua: NEGATIVE
Nitrite: NEGATIVE
Protein, ur: NEGATIVE mg/dL
Specific Gravity, Urine: 1.005 (ref 1.005–1.030)
pH: 9 — ABNORMAL HIGH (ref 5.0–8.0)

## 2021-05-04 LAB — CBC
HCT: 32.7 % — ABNORMAL LOW (ref 36.0–46.0)
Hemoglobin: 11.2 g/dL — ABNORMAL LOW (ref 12.0–15.0)
MCH: 31.9 pg (ref 26.0–34.0)
MCHC: 34.3 g/dL (ref 30.0–36.0)
MCV: 93.2 fL (ref 80.0–100.0)
Platelets: 154 10*3/uL (ref 150–400)
RBC: 3.51 MIL/uL — ABNORMAL LOW (ref 3.87–5.11)
RDW: 12.7 % (ref 11.5–15.5)
WBC: 5.8 10*3/uL (ref 4.0–10.5)
nRBC: 0 % (ref 0.0–0.2)

## 2021-05-04 LAB — METHOTREXATE: Methotrexate: 2

## 2021-05-04 MED ORDER — LEUCOVORIN CALCIUM INJECTION 100 MG
15.0000 mg/m2 | Freq: Four times a day (QID) | INTRAMUSCULAR | Status: AC
  Administered 2021-05-04 – 2021-05-05 (×3): 28 mg via INTRAVENOUS
  Filled 2021-05-04 (×5): qty 1.4

## 2021-05-04 MED ORDER — SODIUM CHLORIDE 0.9 % IV SOLN
2000.0000 mg/m2 | Freq: Two times a day (BID) | INTRAVENOUS | Status: AC
  Administered 2021-05-04 – 2021-05-05 (×2): 3620 mg via INTRAVENOUS
  Filled 2021-05-04 (×3): qty 181

## 2021-05-04 MED ORDER — LEUCOVORIN CALCIUM INJECTION 100 MG
15.0000 mg/m2 | Freq: Once | INTRAMUSCULAR | Status: AC
  Administered 2021-05-05: 06:00:00 28 mg via INTRAVENOUS
  Filled 2021-05-04: qty 1.4

## 2021-05-04 MED ORDER — SODIUM CHLORIDE 0.9 % IV SOLN
Freq: Once | INTRAVENOUS | Status: AC
  Administered 2021-05-04: 16 mg via INTRAVENOUS
  Filled 2021-05-04: qty 8

## 2021-05-04 NOTE — Progress Notes (Signed)
Pharmacy Brief Note - MTX Monitoring: ? ?24 hour MTX level drawn @ 11:43 today = 2 ? ?Results called to Dr. Marin Olp. Verbal order received to continue leucovorin at current dose of 28 mg (15 mg/m2) IV q6h.  ? ?Next MTX level ordered with AM labs tomorrow.  ? ?Lenis Noon, PharmD ?05/04/21 ?6:56 PM ?

## 2021-05-04 NOTE — Progress Notes (Signed)
For right now, [name] is doing well.  She had the high-dose methotrexate yesterday.  She has had no problems with this.  She had little bit of nausea.  There is no vomiting.  She is having diarrhea.  The leucovorin rescue will start today.  Methotrexate levels will be monitored.  Thankfully, pharmacy has been a fantastic job and monitor this. ? ?She gets the high-dose Ara-C today. ? ?There is no problems with her knees.  She has had no rashes.  There is been no headache.  She has had no mouth sores.  She is using the office. ? ?Her white count 5.8.  Hemoglobin 9.2.  The platelet count is 154,000.  Potassium 3.4.  BUN 13 creatinine 0.59. ? ?Her vital signs are temperature 98.1.  Pulse 80.  Blood pressure 118/66.  Her head neck exam shows no mucositis.  Pupils react.  She has no adenopathy in the neck.  Lungs are clear.  Cardiac exam regular rate and rhythm.  Abdomen is soft.  Bowel sounds are present.  She has no fluid wave.  There is a palpable liver or spleen tip.  Extremity shows no clubbing, cyanosis or edema.  Neurological exam is nonfocal. ? ?[name] has large cell lymphoma.  She is on the MATRIX protocol.  She will get her high-dose Ara-C today.  She had 2 doses. ? ?We will follow her methotrexate levels. ? ?So far, she has really had very few side effects.  I am just very pleased as to her tolerance of therapy. ? ?I do appreciate the wonderful care that she is getting from the staff up on 6 E. ? ?[name], MD ? ?Psalms 86:2-3 ?

## 2021-05-05 ENCOUNTER — Encounter: Payer: Self-pay | Admitting: Hematology & Oncology

## 2021-05-05 LAB — CBC
HCT: 32.4 % — ABNORMAL LOW (ref 36.0–46.0)
Hemoglobin: 11.1 g/dL — ABNORMAL LOW (ref 12.0–15.0)
MCH: 32.1 pg (ref 26.0–34.0)
MCHC: 34.3 g/dL (ref 30.0–36.0)
MCV: 93.6 fL (ref 80.0–100.0)
Platelets: 145 10*3/uL — ABNORMAL LOW (ref 150–400)
RBC: 3.46 MIL/uL — ABNORMAL LOW (ref 3.87–5.11)
RDW: 12.8 % (ref 11.5–15.5)
WBC: 5.3 10*3/uL (ref 4.0–10.5)
nRBC: 0 % (ref 0.0–0.2)

## 2021-05-05 LAB — COMPREHENSIVE METABOLIC PANEL
ALT: 115 U/L — ABNORMAL HIGH (ref 0–44)
AST: 82 U/L — ABNORMAL HIGH (ref 15–41)
Albumin: 3.5 g/dL (ref 3.5–5.0)
Alkaline Phosphatase: 58 U/L (ref 38–126)
Anion gap: 5 (ref 5–15)
BUN: 8 mg/dL (ref 8–23)
CO2: 32 mmol/L (ref 22–32)
Calcium: 9.1 mg/dL (ref 8.9–10.3)
Chloride: 102 mmol/L (ref 98–111)
Creatinine, Ser: 0.62 mg/dL (ref 0.44–1.00)
GFR, Estimated: >60 mL/min (ref >60)
Glucose, Bld: 140 mg/dL — ABNORMAL HIGH (ref 70–99)
Potassium: 3.7 mmol/L (ref 3.5–5.1)
Sodium: 139 mmol/L (ref 135–145)
Total Bilirubin: 0.4 mg/dL (ref 0.3–1.2)
Total Protein: 5.9 g/dL — ABNORMAL LOW (ref 6.5–8.1)

## 2021-05-05 LAB — URINALYSIS, ROUTINE W REFLEX MICROSCOPIC
Bilirubin Urine: NEGATIVE
Glucose, UA: NEGATIVE mg/dL
Hgb urine dipstick: NEGATIVE
Ketones, ur: NEGATIVE mg/dL
Leukocytes,Ua: NEGATIVE
Nitrite: NEGATIVE
Protein, ur: NEGATIVE mg/dL
Specific Gravity, Urine: 1.008 (ref 1.005–1.030)
pH: 9 — ABNORMAL HIGH (ref 5.0–8.0)

## 2021-05-05 LAB — METHOTREXATE: Methotrexate: 0.17

## 2021-05-05 MED ORDER — SODIUM CHLORIDE 0.9 % IV SOLN
Freq: Once | INTRAVENOUS | Status: AC
  Administered 2021-05-05: 18:00:00 16 mg via INTRAVENOUS
  Filled 2021-05-05: qty 8

## 2021-05-05 MED ORDER — SODIUM CHLORIDE 0.9 % IV SOLN
2.0000 g/m2 | Freq: Once | INTRAVENOUS | Status: AC
  Administered 2021-05-06: 3620 mg via INTRAVENOUS
  Filled 2021-05-05: qty 36.2

## 2021-05-05 MED ORDER — LEUCOVORIN CALCIUM INJECTION 100 MG
7.5000 mg/m2 | Freq: Three times a day (TID) | INTRAMUSCULAR | Status: AC
  Administered 2021-05-05 – 2021-05-06 (×3): 14 mg via INTRAVENOUS
  Filled 2021-05-05 (×4): qty 0.7

## 2021-05-05 MED ORDER — SODIUM CHLORIDE 0.9 % IV SOLN
2.0000 g/m2 | Freq: Once | INTRAVENOUS | Status: AC
  Administered 2021-05-05: 20:00:00 3620 mg via INTRAVENOUS
  Filled 2021-05-05: qty 36.2

## 2021-05-05 MED ORDER — SODIUM CHLORIDE 0.9 % IV SOLN: 2000.0000 mg/m2 | Freq: Two times a day (BID) | INTRAVENOUS | Status: DC

## 2021-05-05 MED ORDER — LEUCOVORIN CALCIUM INJECTION 100 MG: 7.5000 mg/m2 | Freq: Three times a day (TID) | INTRAMUSCULAR | Status: DC

## 2021-05-05 MED ORDER — SODIUM CHLORIDE 0.9 % IV SOLN
150.0000 mg | Freq: Once | INTRAVENOUS | Status: AC
  Administered 2021-05-05: 08:00:00 150 mg via INTRAVENOUS
  Filled 2021-05-05: qty 5

## 2021-05-05 MED ORDER — SODIUM CHLORIDE 0.9 % IV SOLN
Freq: Once | INTRAVENOUS | Status: AC
  Administered 2021-05-06: 16 mg via INTRAVENOUS
  Filled 2021-05-05: qty 8

## 2021-05-05 NOTE — Progress Notes (Signed)
So far, she is doing well with chemotherapy.  She started the high-dose Ara-C last night.  She has 3 more doses of this. ? ?Her methotrexate level was 2.  She is on the leucovorin rescue.  We will continue her on the current dose. ? ?She has had a little bit of nausea.  She does not have much of an appetite.  There is been no vomiting.  She has had no diarrhea.  There is been no mouth sores.  She has had no fever.  There has been no bleeding.  She has had no achiness in her bones.  There is been no leg swelling. ? ?No labs have back today so far is a CBC.  Her white cell count is 5.3.  Hemoglobin 11.1.  Platelet count is 145,000. ? ?Her vital signs show temperature of 98.4.  Pulse 80.  Blood pressure 128/65.  Her oral exam shows no mucositis.  There is no adenopathy in the neck.  Lungs are clear bilaterally.  Cardiac exam regular rate and rhythm.  Abdomen is soft.  Bowel sounds are present.  There is no guarding or rebound tenderness.  Extremity shows no clubbing, cyanosis or edema.  There is no swelling in her joints.  There is no tenderness over her long bones.  Neurological exam shows no focal neurological deficits. ? ?For right now, we will continue her on the high-dose Ara-C.  Again there is no neurological issues. ? ?We will have to see what her electrolytes show.  We will have to see what the methotrexate level is.  The last time she was here and had methotrexate, she cleared out pretty quickly. ? ?I know that she is getting incredible care from the staff upon 6 E.  I do appreciate all of their help. ? ?[name], MD ? ?[place] 10:39 ?

## 2021-05-05 NOTE — Care Management Important Message (Signed)
Important Message ? ?Patient Details IM Letter given to the Patient. ?Name: Ariel [name] ?MRN: [id number] ?Date of Birth: 12/27/1955 ? ? ?Medicare Important Message Given:  Yes ? ? ? ? ?[name] ?05/05/2021, 9:14 AM ?

## 2021-05-06 LAB — COMPREHENSIVE METABOLIC PANEL
ALT: 194 U/L — ABNORMAL HIGH (ref 0–44)
AST: 131 U/L — ABNORMAL HIGH (ref 15–41)
Albumin: 3.2 g/dL — ABNORMAL LOW (ref 3.5–5.0)
Alkaline Phosphatase: 59 U/L (ref 38–126)
Anion gap: 5 (ref 5–15)
BUN: 9 mg/dL (ref 8–23)
CO2: 30 mmol/L (ref 22–32)
Calcium: 8.6 mg/dL — ABNORMAL LOW (ref 8.9–10.3)
Chloride: 99 mmol/L (ref 98–111)
Creatinine, Ser: 0.53 mg/dL (ref 0.44–1.00)
GFR, Estimated: >60 mL/min (ref >60)
Glucose, Bld: 126 mg/dL — ABNORMAL HIGH (ref 70–99)
Potassium: 3.7 mmol/L (ref 3.5–5.1)
Sodium: 134 mmol/L — ABNORMAL LOW (ref 135–145)
Total Bilirubin: 0.7 mg/dL (ref 0.3–1.2)
Total Protein: 5.7 g/dL — ABNORMAL LOW (ref 6.5–8.1)

## 2021-05-06 LAB — URINALYSIS, ROUTINE W REFLEX MICROSCOPIC
Bilirubin Urine: NEGATIVE
Glucose, UA: NEGATIVE mg/dL
Hgb urine dipstick: NEGATIVE
Ketones, ur: NEGATIVE mg/dL
Leukocytes,Ua: NEGATIVE
Nitrite: NEGATIVE
Protein, ur: NEGATIVE mg/dL
Specific Gravity, Urine: 1.009 (ref 1.005–1.030)
pH: 9 — ABNORMAL HIGH (ref 5.0–8.0)

## 2021-05-06 LAB — CBC
HCT: 28.8 % — ABNORMAL LOW (ref 36.0–46.0)
Hemoglobin: 10.2 g/dL — ABNORMAL LOW (ref 12.0–15.0)
MCH: 32.6 pg (ref 26.0–34.0)
MCHC: 35.4 g/dL (ref 30.0–36.0)
MCV: 92 fL (ref 80.0–100.0)
Platelets: 133 10*3/uL — ABNORMAL LOW (ref 150–400)
RBC: 3.13 MIL/uL — ABNORMAL LOW (ref 3.87–5.11)
RDW: 12.3 % (ref 11.5–15.5)
WBC: 4.1 10*3/uL (ref 4.0–10.5)
nRBC: 0 % (ref 0.0–0.2)

## 2021-05-06 LAB — METHOTREXATE: Methotrexate: 0.07

## 2021-05-06 MED ORDER — SODIUM CHLORIDE 0.9 % IV SOLN
8.0000 mg | Freq: Once | INTRAVENOUS | Status: AC
  Administered 2021-05-06: 8 mg via INTRAVENOUS
  Filled 2021-05-06: qty 54

## 2021-05-06 MED ORDER — THIOTEPA CHEMO INJECTION 15 MG
29.0000 mg/m2 | Freq: Once | INTRAVENOUS | Status: AC
  Administered 2021-05-06: 52 mg via INTRAVENOUS
  Filled 2021-05-06: qty 5

## 2021-05-06 MED ORDER — SODIUM CHLORIDE 0.9 % IV SOLN
150.0000 mg | Freq: Once | INTRAVENOUS | Status: DC
  Filled 2021-05-06: qty 5

## 2021-05-06 NOTE — Progress Notes (Signed)
Dr. Marin Olp has reviewed patient's labs today. ?Okay to treat. ?Emend 150 mg IV and Zofran 8 mg IV will be added as premedications to thiotepa per his instructions. ?

## 2021-05-06 NOTE — Progress Notes (Signed)
Pharmacy Brief Note - MTX Monitoring: ? ?66 hour MTX level drawn @ 06:13 today = 0.07 ? ?Results called to Dr. Marin Olp. Verbal order received to stop leucovorin ? ?No further MTX levels needed ? ?Peggyann Juba, PharmD, BCPS ?Pharmacy: [phone] ?05/06/21 11:36 AM ?

## 2021-05-06 NOTE — Progress Notes (Signed)
So far, Ariel [name] has done really well with treatment.  I must say that she really illuminates the methotrexate about as fast as I have seen in a patient.  Her methotrexate level yesterday was 0.17.  We have cut back on the leucovorin rescue..  She does have the elevated LFTs which have always happen with her when she has methotrexate. ? ?There is no neurological issues with respect to the high-dose Ara-C.  She has 1 more dose today and then she will get the thiotepa this afternoon. ? ?She does not have much of an appetite.  She just has no taste.  She may have little nausea but no vomiting.  There is no diarrhea.  Is no mouth sores.  She has had no bleeding.  There is no knee or leg swelling.  There is no arthralgias. ? ?Her SGPT is 194 SGOT 131.  Her BUN is 9 creatinine 0.53.  Sodium is 134.  There is no CBC back as of yet. ? ?Her vital signs show temperature of 99.1.  Pulse 88.  Blood pressure 119/60.  Her head and neck exam shows no mucositis.  She has no thrush.  She has no adenopathy in the neck.  There is no corneal inflammation.  She is using the Decadron eyedrops.  Her lungs are clear.  Cardiac exam regular rate and rhythm.  Abdomen is soft.  Bowel sounds are active.  There is no fluid wave.  There is no guarding or rebound tenderness.  Extremity shows no clubbing, cyanosis or edema.  Neurological exam is nonfocal. ? ?Today will be her last day of chemotherapy.  We will see what her methotrexate level is.  She does have the elevated LFTs which is not surprising with the methotrexate. ? ?We might be able to get her home tomorrow.  We will have to see what her labs look like.  If not Saturday, then we will probably have to discharge her on Monday. ? ?I do appreciate the incredible care that she is gotten from the staff up on 6 E. ? ?[name], MD ? ?2 Cor 5:7 ?

## 2021-05-06 NOTE — Progress Notes (Signed)
Patient will receive thiotepa evening of 05/06/21. ?Cytarabine in the am, thiotepa in the evening. ?Orders changed per Dr. Antonieta Pert instructions. ?

## 2021-05-07 LAB — CBC
HCT: 27.9 % — ABNORMAL LOW (ref 36.0–46.0)
Hemoglobin: 9.7 g/dL — ABNORMAL LOW (ref 12.0–15.0)
MCH: 32.6 pg (ref 26.0–34.0)
MCHC: 34.8 g/dL (ref 30.0–36.0)
MCV: 93.6 fL (ref 80.0–100.0)
Platelets: 111 10*3/uL — ABNORMAL LOW (ref 150–400)
RBC: 2.98 MIL/uL — ABNORMAL LOW (ref 3.87–5.11)
RDW: 12.3 % (ref 11.5–15.5)
WBC: 2.6 10*3/uL — ABNORMAL LOW (ref 4.0–10.5)
nRBC: 0 % (ref 0.0–0.2)

## 2021-05-07 LAB — COMPREHENSIVE METABOLIC PANEL
ALT: 326 U/L — ABNORMAL HIGH (ref 0–44)
AST: 223 U/L — ABNORMAL HIGH (ref 15–41)
Albumin: 3.2 g/dL — ABNORMAL LOW (ref 3.5–5.0)
Alkaline Phosphatase: 58 U/L (ref 38–126)
Anion gap: 6 (ref 5–15)
BUN: 12 mg/dL (ref 8–23)
CO2: 31 mmol/L (ref 22–32)
Calcium: 8.8 mg/dL — ABNORMAL LOW (ref 8.9–10.3)
Chloride: 100 mmol/L (ref 98–111)
Creatinine, Ser: 0.64 mg/dL (ref 0.44–1.00)
GFR, Estimated: >60 mL/min (ref >60)
Glucose, Bld: 113 mg/dL — ABNORMAL HIGH (ref 70–99)
Potassium: 3.7 mmol/L (ref 3.5–5.1)
Sodium: 137 mmol/L (ref 135–145)
Total Bilirubin: 0.4 mg/dL (ref 0.3–1.2)
Total Protein: 5.7 g/dL — ABNORMAL LOW (ref 6.5–8.1)

## 2021-05-07 MED ORDER — HEPARIN SOD (PORK) LOCK FLUSH 100 UNIT/ML IV SOLN: 500.0000 [IU] | Freq: Once | INTRAVENOUS | Status: AC

## 2021-05-07 MED ORDER — HEPARIN SOD (PORK) LOCK FLUSH 100 UNIT/ML IV SOLN
INTRAVENOUS | Status: AC
  Administered 2021-05-07: 500 [IU]
  Filled 2021-05-07: qty 5

## 2021-05-07 NOTE — Progress Notes (Signed)
Brand Males to be D/C'd  assisted living  per MD order.  Discussed with the patient and all questions fully answered. ? ?VSS, Skin clean, dry and intact without evidence of skin break down, no evidence of skin tears noted. ?IV catheter discontinued intact. Port-a-cath de-accessed. Site without signs and symptoms of complications. Dressing and pressure applied. ? ?An After Visit Summary was printed and given to the patient.  ? ?D/c education completed with patient/family including follow up instructions, medication list, d/c activities limitations if indicated, with other d/c instructions as indicated by MD - patient able to verbalize understanding, all questions fully answered.  ? ?Patient instructed to return to ED, call 911, or call MD for any changes in condition.  ? ?Patient escorted via [facility] to discharge lounge per hospital policy to await son to pick her up. ? ?Manuella Ghazi ?05/07/2021 9:19 AM  ?

## 2021-05-07 NOTE — Discharge Summary (Signed)
Physician Discharge Summary  ?Patient ID: ?Brand Males ?'@ATTENDINGNPI'$ @ ?MRN: [id number] ?DOB/AGE: 08/23/1955 66 y.o. ? ?Admit date: 05/02/2021 ?Discharge date: 05/07/2021 ? ?Admission Diagnoses: ?Diffuse large cell non-Hodgkin's lymphoma ? ?Discharge Diagnoses:  ?Principal Problem: ?  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  High grade B-cell lymphoma ([place]) ?  Encounter for antineoplastic chemotherapy ? ? ?Discharged Condition: good ? ?Discharge Labs:  ? ?Significant Diagnostic Studies: None ? ?Consults: None ? ?Disposition: Discharge disposition: 03-Skilled Nursing Facility ? ? ? ? ? ? ?Treatments: Status post cycle 1 of chemotherapy with MATRIX ? ?Allergies as of 05/07/2021   ? ?   Reactions  ? Codeine Nausea Only  ? Decadron [dexamethasone] Other (See Comments)  ? Unknown reaction  ? Plavix [clopidogrel] Hives, Swelling  ? ,lip swelling  ? Prednisone Other (See Comments)  ? Steroids caused psychological issues   ? ?  ? ?  ?Medication List  ?  ? ?TAKE these medications   ? ?acyclovir 200 MG capsule ?Commonly known as: ZOVIRAX ?Take 1 capsule (200 mg total) by mouth 2 (two) times daily. ?  ?antiseptic oral rinse Liqd ?15 mLs by Mouth Rinse route every 4 (four) hours. ?What changed:  ?when to take this ?reasons to take this ?  ?docusate sodium 100 MG capsule ?Commonly known as: COLACE ?Take 1 capsule (100 mg total) by mouth 2 (two) times daily as needed for mild constipation. ?What changed: when to take this ?  ?lidocaine-prilocaine cream ?Commonly known as: EMLA ?Apply to affected area once ?  ?OVER THE COUNTER MEDICATION ?Take 3 capsules by mouth every morning. Vegetable capsules ?  ?OVER THE COUNTER MEDICATION ?Take 3 capsules by mouth every morning. Fruit capsules ?  ?sodium bicarbonate/sodium chloride Soln ?30 application by Mouth Rinse route every 4 (four) hours. ?What changed:  ?how much to take ?when to take this ?reasons to take this ?  ? ?  ? ? ? Follow-up Information   ? ? Volanda Napoleon,  MD Follow up in 2 day(s).   ?Specialty: Oncology ?Why: We will call you for an appointment on Monday.  I need to see if you need to have a white cell booster shot. ?Contact information: ?[address] ?[address] ?[place] [address] ?[phone] ? ? ?  ?  ? ?  ?  ? ?  ? ? ?Hospital Course: Ariel [name] was admitted to [facility] on 05/02/2021.  This was for her first cycle of chemotherapy with MATRIX.  I decided to try a "hybrid" protocol that included high-dose Ara-C. ? ?She had a Port-A-Cath accessed.  Her labs are looked okay.  She was given IV fluid.  Pharmacy was very helpful and it managing her fluids.  She had sodium bicarb in the IV fluid. ? ?She received her chemotherapy protocol as written.  She received Rituxan the day of her admission.  After that, she then got the high-dose methotrexate.  She tolerated this well.  There were no complications.  We then put her on leucovorin rescue.  She eliminated the methotrexate very quickly.  Her methotrexate levels dropped to 0.017 after 2 days.  We then stopped the leucovorin rescue..  She then got the high-dose Ara-C.  She had 4 doses.  We gave her Decadron eyedrops.  This helped with the irritation that could happen.  She had no problems with this.  She then finally received the thiotepa. ? ?She did have some elevated liver function studies.  She was not symptomatic.  She  had a little bit of nausea but no vomiting.  We gave her some extra Zofran and Emend. ? ?She had no diarrhea.  She had some arthralgias from arthritis.  She had no knee swelling. ? ?She had no cough or shortness of breath.  She had no mouth sores. ? ?On the day of discharge, her white cell count was 4.1.  Hemoglobin 10.2.  Platelet count 133,000. ? ?Her sodium was 137.  Potassium 3.7.  BUN 12 creatinine 0.64.  Calcium 8.8.  Albumin 3.2.  Bilirubin 0.4.  Alkaline phosphatase 58.  SGPT 326 and SGOT 223. ? ?Again she felt well.  She is going to a assisted living facility.  She gets  wonderful care there. ? ? ?Discharge Exam: ?Blood pressure 121/60, pulse 77, temperature 99.4 ?F (37.4 ?C), temperature source Oral, resp. rate 16, height '5\' 7"'$  (1.702 m), weight 155 lb 13.8 oz (70.7 kg), SpO2 95 %. ?On physical exam, her vital signs show a temperature of 99.4.  Pulse 77.  Blood pressure 121/60.  Weight is 155 pounds.  Head neck exam shows no scleral icterus.  There is no conjunctival inflammation.  There is no oral lesions.  She has no adenopathy in the neck.  Lungs are clear bilaterally.  Cardiac exam regular rate and rhythm with no murmurs, rubs or bruits.  Abdomen is soft.  She has good bowel sounds.  There is no guarding or rebound tenderness.  There is no palpable hepatomegaly.  Extremity shows no clubbing, cyanosis or edema.  She may have little bit of swelling with the left knee.  She has good range of motion of the extremities.  She has good strength.  Skin exam shows no rashes, ecchymosis or petechia.  Neurological exam shows no focal neurological deficits.   ? ? ?Signed: ?Volanda Napoleon ?05/07/2021, 7:33 AM  ?

## 2021-05-09 ENCOUNTER — Encounter: Payer: Self-pay | Admitting: *Deleted

## 2021-05-09 ENCOUNTER — Inpatient Hospital Stay: Payer: Medicare Other

## 2021-05-09 ENCOUNTER — Inpatient Hospital Stay: Payer: Medicare Other | Attending: Internal Medicine

## 2021-05-09 ENCOUNTER — Encounter: Payer: Self-pay | Admitting: Hematology & Oncology

## 2021-05-09 ENCOUNTER — Other Ambulatory Visit: Payer: Self-pay

## 2021-05-09 ENCOUNTER — Inpatient Hospital Stay: Payer: Medicare Other | Admitting: Hematology & Oncology

## 2021-05-09 ENCOUNTER — Telehealth: Payer: Self-pay | Admitting: *Deleted

## 2021-05-09 VITALS — BP 118/63 | HR 91 | Temp 98.1°F | Resp 20 | Wt 150.0 lb

## 2021-05-09 DIAGNOSIS — C8330 Diffuse large B-cell lymphoma, unspecified site: Secondary | ICD-10-CM | POA: Insufficient documentation

## 2021-05-09 DIAGNOSIS — D500 Iron deficiency anemia secondary to blood loss (chronic): Secondary | ICD-10-CM

## 2021-05-09 DIAGNOSIS — Z5189 Encounter for other specified aftercare: Secondary | ICD-10-CM | POA: Diagnosis not present

## 2021-05-09 DIAGNOSIS — C8510 Unspecified B-cell lymphoma, unspecified site: Secondary | ICD-10-CM | POA: Diagnosis not present

## 2021-05-09 DIAGNOSIS — D509 Iron deficiency anemia, unspecified: Secondary | ICD-10-CM | POA: Insufficient documentation

## 2021-05-09 LAB — CBC WITH DIFFERENTIAL (CANCER CENTER ONLY)
Abs Immature Granulocytes: 0.02 10*3/uL (ref 0.00–0.07)
Basophils Absolute: 0 10*3/uL (ref 0.0–0.1)
Basophils Relative: 1 %
Eosinophils Absolute: 0.1 10*3/uL (ref 0.0–0.5)
Eosinophils Relative: 8 %
HCT: 30.3 % — ABNORMAL LOW (ref 36.0–46.0)
Hemoglobin: 10.7 g/dL — ABNORMAL LOW (ref 12.0–15.0)
Immature Granulocytes: 1 %
Lymphocytes Relative: 13 %
Lymphs Abs: 0.2 10*3/uL — ABNORMAL LOW (ref 0.7–4.0)
MCH: 31.9 pg (ref 26.0–34.0)
MCHC: 35.3 g/dL (ref 30.0–36.0)
MCV: 90.4 fL (ref 80.0–100.0)
Monocytes Absolute: 0 10*3/uL — ABNORMAL LOW (ref 0.1–1.0)
Monocytes Relative: 0 %
Neutro Abs: 1.2 10*3/uL — ABNORMAL LOW (ref 1.7–7.7)
Neutrophils Relative %: 77 %
Platelet Count: 69 10*3/uL — ABNORMAL LOW (ref 150–400)
RBC: 3.35 MIL/uL — ABNORMAL LOW (ref 3.87–5.11)
RDW: 12.2 % (ref 11.5–15.5)
WBC Count: 1.6 10*3/uL — ABNORMAL LOW (ref 4.0–10.5)
nRBC: 0 % (ref 0.0–0.2)

## 2021-05-09 LAB — CMP (CANCER CENTER ONLY)
ALT: 349 U/L (ref 0–44)
AST: 130 U/L — ABNORMAL HIGH (ref 15–41)
Albumin: 4.5 g/dL (ref 3.5–5.0)
Alkaline Phosphatase: 81 U/L (ref 38–126)
Anion gap: 8 (ref 5–15)
BUN: 23 mg/dL (ref 8–23)
CO2: 28 mmol/L (ref 22–32)
Calcium: 10.3 mg/dL (ref 8.9–10.3)
Chloride: 99 mmol/L (ref 98–111)
Creatinine: 0.7 mg/dL (ref 0.44–1.00)
GFR, Estimated: >60 mL/min (ref >60)
Glucose, Bld: 113 mg/dL — ABNORMAL HIGH (ref 70–99)
Potassium: 4.3 mmol/L (ref 3.5–5.1)
Sodium: 135 mmol/L (ref 135–145)
Total Bilirubin: 0.8 mg/dL (ref 0.3–1.2)
Total Protein: 6.6 g/dL (ref 6.5–8.1)

## 2021-05-09 LAB — IRON AND IRON BINDING CAPACITY (CC-WL,HP ONLY)
Iron: 260 ug/dL — ABNORMAL HIGH (ref 28–170)
Saturation Ratios: 100 % — ABNORMAL HIGH (ref 10.4–31.8)
TIBC: 260 ug/dL (ref 250–450)
UIBC: 0 ug/dL — ABNORMAL LOW (ref 148–442)

## 2021-05-09 LAB — SAMPLE TO BLOOD BANK

## 2021-05-09 LAB — LACTATE DEHYDROGENASE: LDH: 235 U/L — ABNORMAL HIGH (ref 98–192)

## 2021-05-09 LAB — RETICULOCYTES
Immature Retic Fract: 0.5 % — ABNORMAL LOW (ref 2.3–15.9)
RBC.: 3.36 MIL/uL — ABNORMAL LOW (ref 3.87–5.11)
Retic Count, Absolute: 14.8 10*3/uL — ABNORMAL LOW (ref 19.0–186.0)
Retic Ct Pct: 0.4 % (ref 0.4–3.1)

## 2021-05-09 MED ORDER — PEGFILGRASTIM-CBQV 6 MG/0.6ML ~~LOC~~ SOSY
6.0000 mg | PREFILLED_SYRINGE | Freq: Once | SUBCUTANEOUS | Status: AC
  Administered 2021-05-09: 6 mg via SUBCUTANEOUS
  Filled 2021-05-09: qty 0.6

## 2021-05-09 MED ORDER — FLUCONAZOLE 100 MG PO TABS: 100.0000 mg | ORAL_TABLET | Freq: Every day | ORAL | 4 refills | Status: DC

## 2021-05-09 MED ORDER — CIPROFLOXACIN HCL 500 MG PO TABS: 500.0000 mg | ORAL_TABLET | Freq: Every day | ORAL | 4 refills | Status: DC

## 2021-05-09 NOTE — Progress Notes (Signed)
Patient discharged from hospital over the weekend. She came in today for Neulasta injection. She will need a PET prior to her next appointment.  ? ?PET scheduled for 05/25/21. Patient is aware of PET date, time and location. She is also aware of arrival time and NPO status. Radiology information sheet also mailed to patient home with same information, to reinforce education.  ? ?Oncology Nurse Navigator Documentation ? ?Oncology Nurse Navigator Flowsheets 05/09/2021  ?Abnormal Finding Date -  ?Confirmed Diagnosis Date -  ?Phase of Treatment -  ?Chemotherapy Actual Start Date: -  ?Navigator Follow Up Date: 05/25/2021  ?Navigator Follow Up Reason: Scan Review  ?Navigator Location [facility]  ?Navigator Encounter Type Treatment;Telephone;Appt/Treatment Plan Review  ?Telephone Education;Outgoing Call  ?Treatment Initiated Date -  ?Patient Visit Type MedOnc  ?Treatment Phase Active Tx  ?Barriers/Navigation Needs Coordination of Care;Education  ?Education -  ?Interventions Coordination of Care;Education;Psycho-Social Support  ?Acuity Level 2-Minimal Needs (1-2 Barriers Identified)  ?Referrals -  ?Coordination of Care Radiology  ?Education Method Verbal;Teach-back;Written  ?Support Groups/Services Friends and Family  ?Time Spent with Patient 30  ?  ?

## 2021-05-09 NOTE — Telephone Encounter (Signed)
Critical ALT result reported by Ulice Dash in lab.  Dr Marin Olp notified.  Seeing patient in clinic today.   ?

## 2021-05-09 NOTE — Progress Notes (Signed)
?Hematology and Oncology Follow Up Visit ? ?Ariel [name] ?[id number] ?02-Aug-1955 66 y.o. ?05/09/2021 ? ? ?Principle Diagnosis:  ?Diffuse large cell non-Hodgkin's lymphoma-CNS involvement ? ?Current Therapy:   ?Status post chemotherapy with R-ICE/R-MTV --  s/p cycle #4 ?MATRIX -- s/p cycle#1 -- start on 05/02/2021 ?Neulasta 6 mg subcu post chemotherapy ?    ?Interim History:  Ariel [name] is comes in for follow-up.  She was recently discharged from the hospital after her first cycle of MATRIX protocol.  She tolerated this incredibly well.  She did have some elevated LFTs.  This is from the high-dose methotrexate that she received. ? ?She had no problems with the high-dose  Ara-C.  She tolerated this incredibly well. ? ?She comes in today.  We will get a give her a dose of Neulasta just because I do not want her white cell count to go down to 4.  I will put her on some prophylactic ciprofloxacin and Diflucan.  She already is on acyclovir. ? ?She has little bit of nausea.  She has had a little bit of loose stool.  There is no bleeding.  She has had no cough or shortness of breath.  There is been no mouth sores.  She has had no rashes.  She has had no bony aches or pains.  She does have some arthritis. ? ?Overall, I would say performance status is probably ECOG 1.   ? ?Medications:  ?Current Outpatient Medications:  ?  acyclovir (ZOVIRAX) 200 MG capsule, Take 1 capsule (200 mg total) by mouth 2 (two) times daily., Disp: 60 capsule, Rfl: 3 ?  antiseptic oral rinse (BIOTENE) LIQD, 15 mLs by Mouth Rinse route every 4 (four) hours. (Patient taking differently: 15 mLs by Mouth Rinse route every 4 (four) hours as needed for dry mouth or mouth pain.), Disp: 500 mL, Rfl: 4 ?  ciprofloxacin (CIPRO) 500 MG tablet, Take 1 tablet (500 mg total) by mouth daily with breakfast., Disp: 15 tablet, Rfl: 4 ?  docusate sodium (COLACE) 100 MG capsule, Take 1 capsule (100 mg total) by mouth 2 (two) times daily as needed for mild constipation.  (Patient taking differently: Take 100 mg by mouth at bedtime.), Disp: 10 capsule, Rfl: 0 ?  fluconazole (DIFLUCAN) 100 MG tablet, Take 1 tablet (100 mg total) by mouth daily., Disp: 15 tablet, Rfl: 4 ?  OVER THE COUNTER MEDICATION, Take 3 capsules by mouth every morning. Vegetable capsules, Disp: , Rfl:  ?  OVER THE COUNTER MEDICATION, Take 3 capsules by mouth every morning. Fruit capsules, Disp: , Rfl:  ?  Sodium Chloride-Sodium Bicarb (SODIUM BICARBONATE/SODIUM CHLORIDE) SOLN, 30 application by Mouth Rinse route every 4 (four) hours. (Patient taking differently: 1 application. by Mouth Rinse route every 4 (four) hours as needed for mouth pain.), Disp: 1000 mL, Rfl: 3 ?  lidocaine-prilocaine (EMLA) cream, Apply to affected area once (Patient not taking: Reported on 01/14/2021), Disp: 30 g, Rfl: 3 ? ?Allergies:  ?Allergies  ?Allergen Reactions  ? Codeine Nausea Only  ? Decadron [Dexamethasone] Other (See Comments)  ?  Unknown reaction  ? Plavix [Clopidogrel] Hives and Swelling  ?  ,lip swelling ?  ? Prednisone Other (See Comments)  ?  Steroids caused psychological issues   ? ? ?Past Medical History, Surgical history, Social history, and Family History were reviewed and updated. ? ?Review of Systems: ?Review of Systems  ?Constitutional: Negative.   ?HENT:  Negative.    ?Eyes: Negative.   ?Respiratory: Negative.    ?Cardiovascular: Negative.   ?  Gastrointestinal: Negative.   ?Endocrine: Negative.   ?Genitourinary: Negative.    ?Musculoskeletal: Negative.   ?Skin: Negative.   ?Neurological: Negative.   ?Hematological: Negative.   ?Psychiatric/Behavioral: Negative.    ? ?Physical Exam: ? weight is 150 lb (68 kg). Her oral temperature is 98.1 ?F (36.7 ?C). Her blood pressure is 118/63 and her pulse is 91. Her respiration is 20 and oxygen saturation is 100%.  ? ?Wt Readings from Last 3 Encounters:  ?05/09/21 150 lb (68 kg)  ?05/07/21 155 lb 13.8 oz (70.7 kg)  ?04/26/21 153 lb (69.4 kg)  ? ? ?Physical Exam ?Vitals reviewed.   ?HENT:  ?   Head: Normocephalic and atraumatic.  ?Eyes:  ?   Pupils: Pupils are equal, round, and reactive to light.  ?Cardiovascular:  ?   Rate and Rhythm: Normal rate and regular rhythm.  ?   Heart sounds: Normal heart sounds.  ?Pulmonary:  ?   Effort: Pulmonary effort is normal.  ?   Breath sounds: Normal breath sounds.  ?Abdominal:  ?   General: Bowel sounds are normal.  ?   Palpations: Abdomen is soft.  ?Musculoskeletal:     ?   General: No tenderness or deformity. Normal range of motion.  ?   Cervical back: Normal range of motion.  ?Lymphadenopathy:  ?   Cervical: No cervical adenopathy.  ?Skin: ?   General: Skin is warm and dry.  ?   Findings: No erythema or rash.  ?Neurological:  ?   Mental Status: She is alert and oriented to person, place, and time.  ?Psychiatric:     ?   Behavior: Behavior normal.     ?   Thought Content: Thought content normal.     ?   Judgment: Judgment normal.  ? ? ? ?Lab Results  ?Component Value Date  ? WBC 1.6 (L) 05/09/2021  ? HGB 10.7 (L) 05/09/2021  ? HCT 30.3 (L) 05/09/2021  ? MCV 90.4 05/09/2021  ? PLT 69 (L) 05/09/2021  ? ?  Chemistry   ?   ?Component Value Date/Time  ? NA 135 05/09/2021 1015  ? K 4.3 05/09/2021 1015  ? CL 99 05/09/2021 1015  ? CO2 28 05/09/2021 1015  ? BUN 23 05/09/2021 1015  ? CREATININE 0.70 05/09/2021 1015  ? CREATININE 0.68 11/18/2012 1439  ?    ?Component Value Date/Time  ? CALCIUM 10.3 05/09/2021 1015  ? ALKPHOS 81 05/09/2021 1015  ? AST 130 (H) 05/09/2021 1015  ? ALT 349 (HH) 05/09/2021 1015  ? BILITOT 0.8 05/09/2021 1015  ?  ? ? ?Impression and Plan: ?Ariel [name] is a very charming 66 year old white female.  She initially came to [place] with iron deficiency anemia.  We subsequently found that she had diffuse large cell non-Hodgkin's lymphoma.  It had traveled to her brain. ? ?She has had 4 cycles of treatment.  We basically gave her R-ICE alternating with R-MTV.  She tolerated this very well. ? ?Now have her on a hybrid protocol which I think will do well  with respect to systemic therapy and CNS therapy. ? ?I would like to get her a PET scan.  Her last scan was done back in December.  I think it be helpful to get 1 to see how everything looks.  I will get this set up in a couple weeks. ? ?She will be due for her next inpatient chemotherapy with MATRIX in about 3 to 4 weeks.  She would like to push treatment back  in 1 week so that her daughter can come up from [place] to spend the Easter holiday. ? ?I will see Ariel [name] back in 2 weeks. ? ?Volanda Napoleon, MD ?3/13/202311:39 AM  ?

## 2021-05-16 ENCOUNTER — Encounter (HOSPITAL_COMMUNITY): Payer: Self-pay | Admitting: Hematology & Oncology

## 2021-05-16 ENCOUNTER — Inpatient Hospital Stay (HOSPITAL_COMMUNITY)
Admission: AD | Admit: 2021-05-16 | Discharge: 2021-05-19 | DRG: 809 | Disposition: A | Discharge status: Home / Self Care | Payer: Medicare Other | Source: Ambulatory Visit | Attending: Hematology & Oncology | Admitting: Hematology & Oncology

## 2021-05-16 ENCOUNTER — Telehealth: Payer: Self-pay | Admitting: *Deleted

## 2021-05-16 ENCOUNTER — Inpatient Hospital Stay: Payer: Medicare Other

## 2021-05-16 ENCOUNTER — Other Ambulatory Visit: Payer: Self-pay | Admitting: *Deleted

## 2021-05-16 ENCOUNTER — Other Ambulatory Visit: Payer: Self-pay

## 2021-05-16 ENCOUNTER — Inpatient Hospital Stay: Payer: Medicare Other | Admitting: Hematology & Oncology

## 2021-05-16 ENCOUNTER — Other Ambulatory Visit: Payer: Self-pay | Admitting: Hematology & Oncology

## 2021-05-16 ENCOUNTER — Encounter: Payer: Self-pay | Admitting: Hematology & Oncology

## 2021-05-16 VITALS — BP 117/55 | HR 118 | Temp 98.0°F | Resp 18 | Wt 149.0 lb

## 2021-05-16 DIAGNOSIS — C8330 Diffuse large B-cell lymphoma, unspecified site: Principal | ICD-10-CM

## 2021-05-16 DIAGNOSIS — C8590 Non-Hodgkin lymphoma, unspecified, unspecified site: Secondary | ICD-10-CM | POA: Diagnosis not present

## 2021-05-16 DIAGNOSIS — C8333 Diffuse large B-cell lymphoma, intra-abdominal lymph nodes: Secondary | ICD-10-CM

## 2021-05-16 DIAGNOSIS — D61810 Antineoplastic chemotherapy induced pancytopenia: Secondary | ICD-10-CM | POA: Diagnosis not present

## 2021-05-16 DIAGNOSIS — Z888 Allergy status to other drugs, medicaments and biological substances status: Secondary | ICD-10-CM | POA: Diagnosis not present

## 2021-05-16 DIAGNOSIS — K068 Other specified disorders of gingiva and edentulous alveolar ridge: Secondary | ICD-10-CM | POA: Diagnosis not present

## 2021-05-16 DIAGNOSIS — D61818 Other pancytopenia: Secondary | ICD-10-CM | POA: Diagnosis not present

## 2021-05-16 DIAGNOSIS — R21 Rash and other nonspecific skin eruption: Secondary | ICD-10-CM | POA: Diagnosis present

## 2021-05-16 DIAGNOSIS — D696 Thrombocytopenia, unspecified: Secondary | ICD-10-CM | POA: Diagnosis not present

## 2021-05-16 DIAGNOSIS — R233 Spontaneous ecchymoses: Secondary | ICD-10-CM | POA: Diagnosis present

## 2021-05-16 DIAGNOSIS — T451X5A Adverse effect of antineoplastic and immunosuppressive drugs, initial encounter: Secondary | ICD-10-CM | POA: Diagnosis not present

## 2021-05-16 DIAGNOSIS — Z885 Allergy status to narcotic agent status: Secondary | ICD-10-CM | POA: Diagnosis not present

## 2021-05-16 DIAGNOSIS — C8510 Unspecified B-cell lymphoma, unspecified site: Secondary | ICD-10-CM

## 2021-05-16 DIAGNOSIS — D6481 Anemia due to antineoplastic chemotherapy: Secondary | ICD-10-CM | POA: Diagnosis not present

## 2021-05-16 DIAGNOSIS — D630 Anemia in neoplastic disease: Secondary | ICD-10-CM | POA: Diagnosis present

## 2021-05-16 LAB — COMPREHENSIVE METABOLIC PANEL
ALT: 47 U/L — ABNORMAL HIGH (ref 0–44)
AST: 13 U/L — ABNORMAL LOW (ref 15–41)
Albumin: 4 g/dL (ref 3.5–5.0)
Alkaline Phosphatase: 87 U/L (ref 38–126)
Anion gap: 8 (ref 5–15)
BUN: 23 mg/dL (ref 8–23)
CO2: 26 mmol/L (ref 22–32)
Calcium: 9.6 mg/dL (ref 8.9–10.3)
Chloride: 100 mmol/L (ref 98–111)
Creatinine, Ser: 0.69 mg/dL (ref 0.44–1.00)
GFR, Estimated: >60 mL/min (ref >60)
Glucose, Bld: 196 mg/dL — ABNORMAL HIGH (ref 70–99)
Potassium: 4.1 mmol/L (ref 3.5–5.1)
Sodium: 134 mmol/L — ABNORMAL LOW (ref 135–145)
Total Bilirubin: 0.5 mg/dL (ref 0.3–1.2)
Total Protein: 6.2 g/dL — ABNORMAL LOW (ref 6.5–8.1)

## 2021-05-16 LAB — CBC WITH DIFFERENTIAL (CANCER CENTER ONLY)
Abs Immature Granulocytes: 0 10*3/uL (ref 0.00–0.07)
Basophils Absolute: 0 10*3/uL (ref 0.0–0.1)
Basophils Relative: 0 %
Eosinophils Absolute: 0 10*3/uL (ref 0.0–0.5)
Eosinophils Relative: 0 %
HCT: 13.1 % — ABNORMAL LOW (ref 36.0–46.0)
Hemoglobin: 4.8 g/dL — CL (ref 12.0–15.0)
Immature Granulocytes: 0 %
Lymphocytes Relative: 92 %
Lymphs Abs: 0.2 10*3/uL — ABNORMAL LOW (ref 0.7–4.0)
MCH: 32 pg (ref 26.0–34.0)
MCHC: 36.6 g/dL — ABNORMAL HIGH (ref 30.0–36.0)
MCV: 87.3 fL (ref 80.0–100.0)
Monocytes Absolute: 0 10*3/uL — ABNORMAL LOW (ref 0.1–1.0)
Monocytes Relative: 8 %
Neutro Abs: 0 10*3/uL — CL (ref 1.7–7.7)
Neutrophils Relative %: 0 %
Platelet Count: <5 10*3/uL — CL (ref 150–400)
RBC: 1.5 MIL/uL — ABNORMAL LOW (ref 3.87–5.11)
RDW: 11.4 % — ABNORMAL LOW (ref 11.5–15.5)
WBC Count: 0.2 10*3/uL — CL (ref 4.0–10.5)
nRBC: 0 % (ref 0.0–0.2)

## 2021-05-16 LAB — PREPARE RBC (CROSSMATCH)

## 2021-05-16 MED ORDER — FLUCONAZOLE 100 MG PO TABS
100.0000 mg | ORAL_TABLET | Freq: Every day | ORAL | Status: DC
  Administered 2021-05-17: 100 mg via ORAL
  Filled 2021-05-16: qty 1

## 2021-05-16 MED ORDER — ACETAMINOPHEN 325 MG PO TABS
650.0000 mg | ORAL_TABLET | Freq: Once | ORAL | Status: AC
  Administered 2021-05-16: 650 mg via ORAL
  Filled 2021-05-16: qty 2

## 2021-05-16 MED ORDER — FUROSEMIDE 10 MG/ML IJ SOLN
20.0000 mg | Freq: Once | INTRAMUSCULAR | Status: AC
  Administered 2021-05-17: 20 mg via INTRAVENOUS

## 2021-05-16 MED ORDER — SODIUM CHLORIDE 0.9 % IV SOLN: INTRAVENOUS | Status: DC

## 2021-05-16 MED ORDER — SODIUM CHLORIDE 0.9% IV SOLUTION: Freq: Once | INTRAVENOUS | Status: AC

## 2021-05-16 MED ORDER — ACYCLOVIR 200 MG PO CAPS
200.0000 mg | ORAL_CAPSULE | Freq: Two times a day (BID) | ORAL | Status: DC
  Administered 2021-05-16 – 2021-05-19 (×6): 200 mg via ORAL
  Filled 2021-05-16 (×6): qty 1

## 2021-05-16 MED ORDER — CIPROFLOXACIN HCL 500 MG PO TABS
500.0000 mg | ORAL_TABLET | Freq: Every day | ORAL | Status: DC
  Administered 2021-05-17: 500 mg via ORAL
  Filled 2021-05-16: qty 1

## 2021-05-16 MED ORDER — LIDOCAINE-PRILOCAINE 2.5-2.5 % EX CREA: TOPICAL_CREAM | Freq: Once | CUTANEOUS | Status: DC

## 2021-05-16 MED ORDER — FOLIC ACID 1 MG PO TABS
1.0000 mg | ORAL_TABLET | Freq: Every day | ORAL | Status: DC
  Administered 2021-05-16 – 2021-05-19 (×4): 1 mg via ORAL
  Filled 2021-05-16 (×4): qty 1

## 2021-05-16 MED ORDER — AMINOCAPROIC ACID SOLUTION 5% (50 MG/ML)
10.0000 mL | ORAL | Status: DC
  Administered 2021-05-16 – 2021-05-17 (×3): 10 mL via ORAL
  Filled 2021-05-16: qty 100

## 2021-05-16 MED ORDER — SODIUM BICARBONATE/SODIUM CHLORIDE MOUTHWASH
30.0000 "application " | OROMUCOSAL | Status: DC
  Administered 2021-05-16 – 2021-05-19 (×16): 30 via OROMUCOSAL
  Filled 2021-05-16: qty 2000

## 2021-05-16 MED ORDER — BIOTENE DRY MOUTH MT LIQD
15.0000 mL | OROMUCOSAL | Status: DC
  Administered 2021-05-16 – 2021-05-19 (×16): 15 mL via OROMUCOSAL
  Filled 2021-05-16: qty 15

## 2021-05-16 MED ORDER — AMINOCAPROIC ACID SOLUTION 5% (50 MG/ML): 10.0000 mL | ORAL | Status: DC

## 2021-05-16 MED ORDER — DIPHENHYDRAMINE HCL 25 MG PO CAPS
25.0000 mg | ORAL_CAPSULE | Freq: Once | ORAL | Status: AC
  Administered 2021-05-16: 25 mg via ORAL
  Filled 2021-05-16: qty 1

## 2021-05-16 MED ORDER — FUROSEMIDE 10 MG/ML IJ SOLN
20.0000 mg | Freq: Once | INTRAMUSCULAR | Status: AC
  Administered 2021-05-17: 20 mg via INTRAVENOUS
  Filled 2021-05-16: qty 2

## 2021-05-16 NOTE — Telephone Encounter (Signed)
WbC .2, platelets less than 5 and Hgb 4.8 per [facility] in lab.  Dr Marin Olp notified.  Patient in exam room to see Dr Marin Olp today.   ?

## 2021-05-16 NOTE — Telephone Encounter (Signed)
Patient called this morning to let us know that she has been experiencing bleeding in her mouth around one particular tooth which has an implant.  Has had clots in the area.  Feels she is "weak as a kitten"  Dr Marin Olp notified and will see patient today.,  ?

## 2021-05-16 NOTE — Patient Instructions (Signed)

## 2021-05-16 NOTE — H&P (Signed)
?Hematology and Oncology Follow Up Visit ? ?Ariel [name] ?[id number] ?Aug 05, 1955 66 y.o. ?05/16/2021 ? ? ?Principle Diagnosis:  ?Diffuse large cell non-Hodgkin's lymphoma-CNS involvement ? ?Current Therapy:   ?Status post chemotherapy with R-ICE/R-MTV --  s/p cycle #4 ?MATRIX -- s/p cycle#1 -- start on 05/02/2021 ?Neulasta 6 mg subcu post chemotherapy ?    ?Interim History:  Ariel [name] is comes in for an unscheduled visit.  Unfortunate, she began to have bleeding over the weekend.  Starting Sunday, she began to have bleeding from the oral cavity.  She is incredibly fatigued.  She is quite weak.  She has had no nausea or vomiting.  She has not noted any melena or bright red blood per rectum.  She has had no cough.she has had some shortness of breath with exertion. ? ?Today, her blood counts are incredibly low.  Her white cell count is 200.  Hemoglobin 4.8.  Platelet count less than 5000. ? ?We will get have to admit her.  She clearly has had quite marked response to the protocol.  A week ago her blood counts were fine.  We did go ahead and give her Neulasta.  We did get her on antibiotics. ? ?She has had no headache.  There is no double vision or blurred vision.  She has not noted any obvious mucositis.  She just had bleeding from the gums. ? ?She has had a little bit of a rash on her legs.  She has multiple petechia on her legs. ? ?Currently, I would have to say that her performance status is ECOG 1.    ? ?Medications: No current facility-administered medications for this visit. ?No current outpatient medications on file. ? ?Facility-Administered Medications Ordered in Other Visits:  ?  0.9 %  sodium chloride infusion, , Intravenous, Continuous, Sergi Gellner, Rudell Cobb, MD, Last Rate: 50 mL/hr at 05/16/21 1525, New Bag at 96/22/29 7989 ?  folic acid (FOLVITE) tablet 1 mg, 1 mg, Oral, Daily, Taunya Goral, Rudell Cobb, MD, 1 mg at 05/16/21 1620 ?  furosemide (LASIX) injection 20 mg, 20 mg, Intravenous, Once, Sarrah Fiorenza, Rudell Cobb, MD ?   furosemide (LASIX) injection 20 mg, 20 mg, Intravenous, Once, Suann Klier, Rudell Cobb, MD ? ?Allergies:  ?Allergies  ?Allergen Reactions  ? Codeine Nausea Only  ? Decadron [Dexamethasone] Other (See Comments)  ?  Unknown reaction  ? Plavix [Clopidogrel] Hives and Swelling  ?  ,lip swelling ?  ? Prednisone Other (See Comments)  ?  Steroids caused psychological issues   ? ? ?Past Medical History, Surgical history, Social history, and Family History were reviewed and updated. ? ?Review of Systems: ?Review of Systems  ?Constitutional: Negative.   ?HENT:  Negative.    ?Eyes: Negative.   ?Respiratory: Negative.    ?Cardiovascular: Negative.   ?Gastrointestinal: Negative.   ?Endocrine: Negative.   ?Genitourinary: Negative.    ?Musculoskeletal: Negative.   ?Skin: Negative.   ?Neurological: Negative.   ?Hematological: Negative.   ?Psychiatric/Behavioral: Negative.    ? ?Physical Exam: ? weight is 149 lb (67.6 kg). Her oral temperature is 98 ?F (36.7 ?C). Her blood pressure is 117/55 (abnormal) and her pulse is 118 (abnormal). Her respiration is 18 and oxygen saturation is 98%.  ? ?Wt Readings from Last 3 Encounters:  ?05/16/21 149 lb (67.6 kg)  ?05/09/21 150 lb (68 kg)  ?05/07/21 155 lb 13.8 oz (70.7 kg)  ? ? ?Physical Exam ?Vitals reviewed.  ?HENT:  ?   Head: Normocephalic and atraumatic.  ?   Mouth/Throat:  ?  Comments: Oral exam shows multiple sites of gingival bleeding.  She has gauze in her mouth to try to help with the bleeding.  There is no obvious gingival breakdown. ?Eyes:  ?   Pupils: Pupils are equal, round, and reactive to light.  ?Cardiovascular:  ?   Rate and Rhythm: Normal rate and regular rhythm.  ?   Heart sounds: Normal heart sounds.  ?   Comments: Cardiac exam is regular rate and rhythm with no murmurs, rubs or bruits. ?Pulmonary:  ?   Effort: Pulmonary effort is normal.  ?   Breath sounds: Normal breath sounds.  ?Abdominal:  ?   General: Bowel sounds are normal.  ?   Palpations: Abdomen is soft.   ?Musculoskeletal:     ?   General: No tenderness or deformity. Normal range of motion.  ?   Cervical back: Normal range of motion.  ?Lymphadenopathy:  ?   Cervical: No cervical adenopathy.  ?Skin: ?   General: Skin is warm and dry.  ?   Findings: No erythema or rash.  ?   Comments: Skin exam does show petechia on her lower legs.  ?Neurological:  ?   Mental Status: She is alert and oriented to person, place, and time.  ?   Comments: Neurological exam shows no focal neurological deficits.  All cranial nerves are intact.  She has good strength in upper and lower extremities.  ?Psychiatric:     ?   Behavior: Behavior normal.     ?   Thought Content: Thought content normal.     ?   Judgment: Judgment normal.  ? ? ? ?Lab Results  ?Component Value Date  ? WBC 0.2 (LL) 05/16/2021  ? HGB 4.8 (LL) 05/16/2021  ? HCT 13.1 (L) 05/16/2021  ? MCV 87.3 05/16/2021  ? PLT <5 (LL) 05/16/2021  ? ?  Chemistry   ?   ?Component Value Date/Time  ? NA 134 (L) 05/16/2021 1215  ? K 4.1 05/16/2021 1215  ? CL 100 05/16/2021 1215  ? CO2 26 05/16/2021 1215  ? BUN 23 05/16/2021 1215  ? CREATININE 0.69 05/16/2021 1215  ? CREATININE 0.70 05/09/2021 1015  ? CREATININE 0.68 11/18/2012 1439  ?    ?Component Value Date/Time  ? CALCIUM 9.6 05/16/2021 1215  ? ALKPHOS 87 05/16/2021 1215  ? AST 13 (L) 05/16/2021 1215  ? AST 130 (H) 05/09/2021 1015  ? ALT 47 (H) 05/16/2021 1215  ? ALT 349 (HH) 05/09/2021 1015  ? BILITOT 0.5 05/16/2021 1215  ? BILITOT 0.8 05/09/2021 1015  ?  ? ? ?Impression and Plan: ?Ariel [name] is a very charming 66 year old white female.  She initially came to [place] with iron deficiency anemia.  We subsequently found that she had diffuse large cell non-Hodgkin's lymphoma.  It had traveled to her brain. ? ?She has had 4 cycles of treatment.  We basically gave her R-ICE alternating with R-MTV.  She tolerated this very well. ? ?We gave her MATRIX protocol in the hospital about 3 weeks ago.  She tolerated this well.  A week ago her blood counts  were fine. ? ?We will get have to admit her.  She will need to be transfused with 2 units of blood and 1 unit of platelets.  We will give these to her tonight.  We will have to put her on some Amicar to try to help with the severe thrombocytopenia. ? ?She is on antibiotics already.  She is not febrile.  I do not  think we need IV antibiotics for her right now. ? ?I do not see any neurological compromise right now.  Provide think we can hold on doing scans on her. ? ?I suspect she will be in the hospital for a good for 5 days.  Again I am not sure how quickly her blood counts will recover. ? ?I know that she will get wonderful care from the staff up on 6 E.  They know her well. ? ? ?Volanda Napoleon, MD ?3/20/20234:50 PM  ?

## 2021-05-16 NOTE — Progress Notes (Signed)
?Hematology and Oncology Follow Up Visit ? ?Ariel [name] ?[id number] ?1955/08/28 66 y.o. ?05/16/2021 ? ? ?Principle Diagnosis:  ?Diffuse large cell non-Hodgkin's lymphoma-CNS involvement ? ?Current Therapy:   ?Status post chemotherapy with R-ICE/R-MTV --  s/p cycle #4 ?MATRIX -- s/p cycle#1 -- start on 05/02/2021 ?Neulasta 6 mg subcu post chemotherapy ?    ?Interim History:  Ariel [name] is comes in for an unscheduled visit.  Unfortunate, she began to have bleeding over the weekend.  Starting Sunday, she began to have bleeding from the oral cavity.  She is incredibly fatigued.  She is quite weak.  She has had no nausea or vomiting.  She has not noted any melena or bright red blood per rectum.  She has had no cough.she has had some shortness of breath with exertion. ? ?Today, her blood counts are incredibly low.  Her white cell count is 200.  Hemoglobin 4.8.  Platelet count less than 5000. ? ?We will get have to admit her.  She clearly has had quite marked response to the protocol.  A week ago her blood counts were fine.  We did go ahead and give her Neulasta.  We did get her on antibiotics. ? ?She has had no headache.  There is no double vision or blurred vision.  She has not noted any obvious mucositis.  She just had bleeding from the gums. ? ?She has had a little bit of a rash on her legs.  She has multiple petechia on her legs. ? ?Currently, I would have to say that her performance status is ECOG 1.    ? ?Medications: No current facility-administered medications for this visit. ?No current outpatient medications on file. ? ?Facility-Administered Medications Ordered in Other Visits:  ?  0.9 %  sodium chloride infusion, , Intravenous, Continuous, Aniken Monestime, Rudell Cobb, MD, Last Rate: 50 mL/hr at 05/16/21 1525, New Bag at 46/56/81 2751 ?  folic acid (FOLVITE) tablet 1 mg, 1 mg, Oral, Daily, Charlese Gruetzmacher, Rudell Cobb, MD, 1 mg at 05/16/21 1620 ?  furosemide (LASIX) injection 20 mg, 20 mg, Intravenous, Once, Muzammil Bruins, Rudell Cobb, MD ?   furosemide (LASIX) injection 20 mg, 20 mg, Intravenous, Once, Carlin Attridge, Rudell Cobb, MD ? ?Allergies:  ?Allergies  ?Allergen Reactions  ? Codeine Nausea Only  ? Decadron [Dexamethasone] Other (See Comments)  ?  Unknown reaction  ? Plavix [Clopidogrel] Hives and Swelling  ?  ,lip swelling ?  ? Prednisone Other (See Comments)  ?  Steroids caused psychological issues   ? ? ?Past Medical History, Surgical history, Social history, and Family History were reviewed and updated. ? ?Review of Systems: ?Review of Systems  ?Constitutional: Negative.   ?HENT:  Negative.    ?Eyes: Negative.   ?Respiratory: Negative.    ?Cardiovascular: Negative.   ?Gastrointestinal: Negative.   ?Endocrine: Negative.   ?Genitourinary: Negative.    ?Musculoskeletal: Negative.   ?Skin: Negative.   ?Neurological: Negative.   ?Hematological: Negative.   ?Psychiatric/Behavioral: Negative.    ? ?Physical Exam: ? weight is 149 lb (67.6 kg). Her oral temperature is 98 ?F (36.7 ?C). Her blood pressure is 117/55 (abnormal) and her pulse is 118 (abnormal). Her respiration is 18 and oxygen saturation is 98%.  ? ?Wt Readings from Last 3 Encounters:  ?05/16/21 149 lb (67.6 kg)  ?05/09/21 150 lb (68 kg)  ?05/07/21 155 lb 13.8 oz (70.7 kg)  ? ? ?Physical Exam ?Vitals reviewed.  ?HENT:  ?   Head: Normocephalic and atraumatic.  ?   Mouth/Throat:  ?  Comments: Oral exam shows multiple sites of gingival bleeding.  She has gauze in her mouth to try to help with the bleeding.  There is no obvious gingival breakdown. ?Eyes:  ?   Pupils: Pupils are equal, round, and reactive to light.  ?Cardiovascular:  ?   Rate and Rhythm: Normal rate and regular rhythm.  ?   Heart sounds: Normal heart sounds.  ?   Comments: Cardiac exam is regular rate and rhythm with no murmurs, rubs or bruits. ?Pulmonary:  ?   Effort: Pulmonary effort is normal.  ?   Breath sounds: Normal breath sounds.  ?Abdominal:  ?   General: Bowel sounds are normal.  ?   Palpations: Abdomen is soft.   ?Musculoskeletal:     ?   General: No tenderness or deformity. Normal range of motion.  ?   Cervical back: Normal range of motion.  ?Lymphadenopathy:  ?   Cervical: No cervical adenopathy.  ?Skin: ?   General: Skin is warm and dry.  ?   Findings: No erythema or rash.  ?   Comments: Skin exam does show petechia on her lower legs.  ?Neurological:  ?   Mental Status: She is alert and oriented to person, place, and time.  ?   Comments: Neurological exam shows no focal neurological deficits.  All cranial nerves are intact.  She has good strength in upper and lower extremities.  ?Psychiatric:     ?   Behavior: Behavior normal.     ?   Thought Content: Thought content normal.     ?   Judgment: Judgment normal.  ? ? ? ?Lab Results  ?Component Value Date  ? WBC 0.2 (LL) 05/16/2021  ? HGB 4.8 (LL) 05/16/2021  ? HCT 13.1 (L) 05/16/2021  ? MCV 87.3 05/16/2021  ? PLT <5 (LL) 05/16/2021  ? ?  Chemistry   ?   ?Component Value Date/Time  ? NA 134 (L) 05/16/2021 1215  ? K 4.1 05/16/2021 1215  ? CL 100 05/16/2021 1215  ? CO2 26 05/16/2021 1215  ? BUN 23 05/16/2021 1215  ? CREATININE 0.69 05/16/2021 1215  ? CREATININE 0.70 05/09/2021 1015  ? CREATININE 0.68 11/18/2012 1439  ?    ?Component Value Date/Time  ? CALCIUM 9.6 05/16/2021 1215  ? ALKPHOS 87 05/16/2021 1215  ? AST 13 (L) 05/16/2021 1215  ? AST 130 (H) 05/09/2021 1015  ? ALT 47 (H) 05/16/2021 1215  ? ALT 349 (HH) 05/09/2021 1015  ? BILITOT 0.5 05/16/2021 1215  ? BILITOT 0.8 05/09/2021 1015  ?  ? ? ?Impression and Plan: ?Ariel [name] is a very charming 66 year old white female.  She initially came to [place] with iron deficiency anemia.  We subsequently found that she had diffuse large cell non-Hodgkin's lymphoma.  It had traveled to her brain. ? ?She has had 4 cycles of treatment.  We basically gave her R-ICE alternating with R-MTV.  She tolerated this very well. ? ?We gave her MATRIX protocol in the hospital about 3 weeks ago.  She tolerated this well.  A week ago her blood counts  were fine. ? ?We will get have to admit her.  She will need to be transfused with 2 units of blood and 1 unit of platelets.  We will give these to her tonight.  We will have to put her on some Amicar to try to help with the severe thrombocytopenia. ? ?She is on antibiotics already.  She is not febrile.  I do not  think we need IV antibiotics for her right now. ? ?I do not see any neurological compromise right now.  Provide think we can hold on doing scans on her. ? ?I suspect she will be in the hospital for a good for 5 days.  Again I am not sure how quickly her blood counts will recover. ? ?I know that she will get wonderful care from the staff up on [address].  They know her well. ? ? ?Volanda Napoleon, MD ?3/20/20234:50 PM  ?

## 2021-05-17 ENCOUNTER — Encounter: Payer: Self-pay | Admitting: *Deleted

## 2021-05-17 DIAGNOSIS — D61818 Other pancytopenia: Secondary | ICD-10-CM

## 2021-05-17 LAB — CBC WITH DIFFERENTIAL/PLATELET
Abs Immature Granulocytes: 0.1 10*3/uL — ABNORMAL HIGH (ref 0.00–0.07)
Basophils Absolute: 0 10*3/uL (ref 0.0–0.1)
Basophils Relative: 0 %
Eosinophils Absolute: 0 10*3/uL (ref 0.0–0.5)
Eosinophils Relative: 0 %
HCT: 17.6 % — ABNORMAL LOW (ref 36.0–46.0)
Hemoglobin: 6.2 g/dL — CL (ref 12.0–15.0)
Lymphocytes Relative: 60 %
Lymphs Abs: 0.4 10*3/uL — ABNORMAL LOW (ref 0.7–4.0)
MCH: 29.5 pg (ref 26.0–34.0)
MCHC: 35.2 g/dL (ref 30.0–36.0)
MCV: 83.8 fL (ref 80.0–100.0)
Metamyelocytes Relative: 3 %
Monocytes Absolute: 0 10*3/uL — ABNORMAL LOW (ref 0.1–1.0)
Monocytes Relative: 7 %
Myelocytes: 6 %
Neutro Abs: 0.2 10*3/uL — CL (ref 1.7–7.7)
Neutrophils Relative %: 24 %
Platelets: 55 10*3/uL — ABNORMAL LOW (ref 150–400)
RBC: 2.1 MIL/uL — ABNORMAL LOW (ref 3.87–5.11)
RDW: 15.5 % (ref 11.5–15.5)
WBC: 0.7 10*3/uL — CL (ref 4.0–10.5)
nRBC: 0 % (ref 0.0–0.2)

## 2021-05-17 LAB — COMPREHENSIVE METABOLIC PANEL
ALT: 37 U/L (ref 0–44)
AST: 19 U/L (ref 15–41)
Albumin: 3.4 g/dL — ABNORMAL LOW (ref 3.5–5.0)
Alkaline Phosphatase: 69 U/L (ref 38–126)
Anion gap: 7 (ref 5–15)
BUN: 21 mg/dL (ref 8–23)
CO2: 30 mmol/L (ref 22–32)
Calcium: 8.5 mg/dL — ABNORMAL LOW (ref 8.9–10.3)
Chloride: 96 mmol/L — ABNORMAL LOW (ref 98–111)
Creatinine, Ser: 0.78 mg/dL (ref 0.44–1.00)
GFR, Estimated: >60 mL/min (ref >60)
Glucose, Bld: 131 mg/dL — ABNORMAL HIGH (ref 70–99)
Potassium: 3.1 mmol/L — ABNORMAL LOW (ref 3.5–5.1)
Sodium: 133 mmol/L — ABNORMAL LOW (ref 135–145)
Total Bilirubin: 1.2 mg/dL (ref 0.3–1.2)
Total Protein: 5.8 g/dL — ABNORMAL LOW (ref 6.5–8.1)

## 2021-05-17 LAB — URINALYSIS, ROUTINE W REFLEX MICROSCOPIC
Bilirubin Urine: NEGATIVE
Glucose, UA: NEGATIVE mg/dL
Hgb urine dipstick: NEGATIVE
Ketones, ur: NEGATIVE mg/dL
Leukocytes,Ua: NEGATIVE
Nitrite: NEGATIVE
Protein, ur: NEGATIVE mg/dL
Specific Gravity, Urine: 1.008 (ref 1.005–1.030)
pH: 7 (ref 5.0–8.0)

## 2021-05-17 LAB — PREPARE PLATELET PHERESIS
Unit division: 0
Unit division: 0

## 2021-05-17 LAB — BPAM PLATELET PHERESIS
Blood Product Expiration Date: 202303222359
Blood Product Expiration Date: 202303222359
ISSUE DATE / TIME: 202303201630
ISSUE DATE / TIME: 202303201938
Unit Type and Rh: 5100
Unit Type and Rh: 5100

## 2021-05-17 LAB — IRON AND TIBC
Iron: 224 ug/dL — ABNORMAL HIGH (ref 28–170)
Saturation Ratios: 91 % — ABNORMAL HIGH (ref 10.4–31.8)
TIBC: 246 ug/dL — ABNORMAL LOW (ref 250–450)
UIBC: 22 ug/dL

## 2021-05-17 LAB — APTT: aPTT: 28 seconds (ref 24–36)

## 2021-05-17 LAB — PREPARE RBC (CROSSMATCH)

## 2021-05-17 LAB — PROTIME-INR
INR: 1.1 (ref 0.8–1.2)
Prothrombin Time: 14 seconds (ref 11.4–15.2)

## 2021-05-17 MED ORDER — FUROSEMIDE 10 MG/ML IJ SOLN
20.0000 mg | Freq: Once | INTRAMUSCULAR | Status: AC
  Administered 2021-05-17: 20 mg via INTRAVENOUS

## 2021-05-17 MED ORDER — SODIUM CHLORIDE 0.9% IV SOLUTION: Freq: Once | INTRAVENOUS | Status: AC

## 2021-05-17 MED ORDER — ADULT MULTIVITAMIN W/MINERALS CH
1.0000 | ORAL_TABLET | Freq: Every day | ORAL | Status: DC
  Administered 2021-05-17 – 2021-05-18 (×2): 1 via ORAL
  Filled 2021-05-17 (×2): qty 1

## 2021-05-17 MED ORDER — FUROSEMIDE 10 MG/ML IJ SOLN
INTRAMUSCULAR | Status: AC
  Administered 2021-05-17: 20 mg via INTRAVENOUS
  Filled 2021-05-17: qty 2

## 2021-05-17 MED ORDER — POTASSIUM CHLORIDE 10 MEQ/50ML IV SOLN
10.0000 meq | INTRAVENOUS | Status: AC
  Administered 2021-05-17 (×4): 10 meq via INTRAVENOUS
  Filled 2021-05-17 (×5): qty 50

## 2021-05-17 MED ORDER — CHLORHEXIDINE GLUCONATE CLOTH 2 % EX PADS
6.0000 | MEDICATED_PAD | Freq: Every day | CUTANEOUS | Status: DC
  Administered 2021-05-17 – 2021-05-18 (×2): 6 via TOPICAL

## 2021-05-17 MED ORDER — ENSURE ENLIVE PO LIQD
237.0000 mL | Freq: Two times a day (BID) | ORAL | Status: DC
  Administered 2021-05-17 – 2021-05-18 (×3): 237 mL via ORAL

## 2021-05-17 MED ORDER — FUROSEMIDE 10 MG/ML IJ SOLN
20.0000 mg | Freq: Once | INTRAMUSCULAR | Status: AC
  Filled 2021-05-17: qty 2

## 2021-05-17 MED ORDER — AMINOCAPROIC ACID SOLUTION 5% (50 MG/ML)
10.0000 mL | Freq: Three times a day (TID) | ORAL | Status: DC
  Administered 2021-05-17 – 2021-05-18 (×2): 10 mL via ORAL
  Filled 2021-05-17: qty 100

## 2021-05-17 NOTE — Progress Notes (Signed)
Initial Nutrition Assessment ? ?INTERVENTION:  ? ?-Ensure Plus High Protein po BID, each supplement provides 350 kcal and 20 grams of protein.  ? ?-Multivitamin with minerals daily ? ?NUTRITION DIAGNOSIS:  ? ?Increased nutrient needs related to cancer and cancer related treatments as evidenced by estimated needs. ? ?GOAL:  ? ?Patient will meet greater than or equal to 90% of their needs ? ?MONITOR:  ? ?PO intake, Supplement acceptance, Labs, Weight trends, I & O's ? ?REASON FOR ASSESSMENT:  ? ?Malnutrition Screening Tool ?  ? ?ASSESSMENT:  ? ?66 y.o.patient with Diffuse large cell non-Hodgkin's lymphoma-CNS involvement s/p chemotherapy. Admitted with bleeding from the oral cavity. ? ?Patient reports not eating well for the last 36 hours given increased bleeding in her mouth. States typically eats well at her facility, she eats a good breakfast and lunch and then a small dinner. Pt knows to increase protein foods with cancer treatment. States she drinks Ensure as well. Will order for this admission. This morning she tolerated oatmeal and ate 1/2 of a ham sandwich with fruit cup. Denies any pain with eating. States the biotene is helping.  ? ?Pt reports UBW of 150 lbs. Lowest weight she has been is 139 lbs. She states her weight has remained stable recently.  ? ?Medications: Biotene, Folic acid, Lasix ? ?Labs reviewed: ?Low Na, K ? ?NUTRITION - FOCUSED PHYSICAL EXAM: ? ?Flowsheet Row Most Recent Value  ?Orbital Region No depletion  ?Upper Arm Region Moderate depletion  ?Thoracic and Lumbar Region No depletion  ?Buccal Region No depletion  ?Temple Region No depletion  ?Clavicle Bone Region No depletion  ?Clavicle and Acromion Bone Region No depletion  ?Scapular Bone Region No depletion  ?Dorsal Hand Moderate depletion  ?Patellar Region Unable to assess  ?Anterior Thigh Region Unable to assess  ?Posterior Calf Region Unable to assess  ?Edema (RD Assessment) None  ?Hair Unable to assess  [no hair]  ?Eyes Reviewed  ?Mouth  Reviewed  [dry, crusted blood]  ?Skin Reviewed  ? ?  ? ? ?Diet Order:   ?Diet Order   ? ?       ?  Diet regular Room service appropriate? Yes; Fluid consistency: Thin  Diet effective now       ?  ? ?  ?  ? ?  ? ? ?EDUCATION NEEDS:  ? ?No education needs have been identified at this time ? ?Skin:  Skin Assessment: Reviewed RN Assessment ? ?Last BM:  3/19 ? ?Height:  ? ?Ht Readings from Last 1 Encounters:  ?05/16/21 '5\' 7"'$  (1.702 m)  ? ? ?Weight:  ? ?Wt Readings from Last 1 Encounters:  ?05/16/21 67.6 kg  ? ? ?BMI:  Body mass index is 23.34 kg/m?. ? ?Estimated Nutritional Needs:  ? ?Kcal:  2100-2300 ? ?Protein:  100-115g ? ?Fluid:  2.1L/day ? ?Clayton Bibles, MS, RD, LDN ?Inpatient Clinical Dietitian ?Contact information available via Amion ? ?

## 2021-05-17 NOTE — Progress Notes (Signed)
1u of platelets and 2 u pRBCs administered d/t plt count of <5 and hgb of 4.8. lasix given between the units of blood per orders. Bleeding from the mouth has stopped and UOP adequate after lasix administration. Plan of care continues.  ?

## 2021-05-17 NOTE — Progress Notes (Signed)
She is feeling a whole lot better.  She had 2 units of blood 1 unit of platelet.  There is no more bleeding.  The Amicar often seems to be working well.  I will decrease the dosing to every 8 hours. ? ?She has had no nausea or vomiting.  She has had no cough. ? ?Labs not back yet. ? ?She does not have the compression devices on her legs yet.  I cannot put her on Lovenox until her platelet count gets higher. ? ?She is not all that hungry.  Maybe, today, she will eat more. ? ?She has had no issues with fever.  There is no visual changes. ? ?She does not have any diarrhea.  She has a wick in place for her urine output. ? ?Her vital signs are temperature 99.  Pulse 96.  Blood pressure 107/55.  Her lungs are clear.  Oral exam shows no bleeding.  There is no obvious mucositis.  Cardiac exam regular rate and rhythm.  Abdomen is soft.  Bowel sounds are present.  There is no guarding or rebound tenderness.  Extremity shows no clubbing, cyanosis or edema.  Neurological exam is nonfocal. ? ?We will have to see what the labs look like.  She probably will need to have another transfusion. ? ?I will check her iron studies.  She really needs to have the sequential compression devices on. ? ?I am just incredibly impressed with how well the staff upon [address] have managed her so far.  This is very complicated. ? ?[name], MD ? ?Psalms 139: 13-14 ? ? ?

## 2021-05-17 NOTE — Progress Notes (Signed)
Patient was seen for an unscheduled visit in the office yesterday. She was admitted with severe pancytopenia. Will follow for post discharge needs and office follow up.  ? ?Oncology Nurse Navigator Documentation ? ?Oncology Nurse Navigator Flowsheets 05/17/2021  ?Abnormal Finding Date -  ?Confirmed Diagnosis Date -  ?Phase of Treatment -  ?Chemotherapy Actual Start Date: -  ?Navigator Follow Up Date: 05/20/2021  ?Navigator Follow Up Reason: Appointment Review  ?Navigator Location [facility]  ?Navigator Encounter Type Appt/Treatment Plan Review  ?Telephone -  ?Treatment Initiated Date -  ?Patient Visit Type MedOnc  ?Treatment Phase Active Tx  ?Barriers/Navigation Needs Coordination of Care;Education  ?Education -  ?Interventions None Required  ?Acuity Level 2-Minimal Needs (1-2 Barriers Identified)  ?Referrals -  ?Coordination of Care -  ?Education Method -  ?Support Groups/Services Friends and Family  ?Time Spent with Patient 15  ?  ?

## 2021-05-18 LAB — TYPE AND SCREEN
ABO/RH(D): A POS
Antibody Screen: NEGATIVE
Unit division: 0
Unit division: 0
Unit division: 0
Unit division: 0
Unit division: 0

## 2021-05-18 LAB — CBC WITH DIFFERENTIAL/PLATELET
Abs Immature Granulocytes: 0.6 10*3/uL — ABNORMAL HIGH (ref 0.00–0.07)
Band Neutrophils: 6 %
Basophils Absolute: 0 10*3/uL (ref 0.0–0.1)
Basophils Relative: 0 %
Eosinophils Absolute: 0 10*3/uL (ref 0.0–0.5)
Eosinophils Relative: 0 %
HCT: 25.3 % — ABNORMAL LOW (ref 36.0–46.0)
Hemoglobin: 8.8 g/dL — ABNORMAL LOW (ref 12.0–15.0)
Lymphocytes Relative: 14 %
Lymphs Abs: 0.5 10*3/uL — ABNORMAL LOW (ref 0.7–4.0)
MCH: 28.9 pg (ref 26.0–34.0)
MCHC: 34.8 g/dL (ref 30.0–36.0)
MCV: 83 fL (ref 80.0–100.0)
Metamyelocytes Relative: 2 %
Monocytes Absolute: 0.8 10*3/uL (ref 0.1–1.0)
Monocytes Relative: 22 %
Myelocytes: 12 %
Neutro Abs: 1.7 10*3/uL (ref 1.7–7.7)
Neutrophils Relative %: 42 %
Platelets: 41 10*3/uL — ABNORMAL LOW (ref 150–400)
Promyelocytes Relative: 2 %
RBC: 3.05 MIL/uL — ABNORMAL LOW (ref 3.87–5.11)
RDW: 15.5 % (ref 11.5–15.5)
WBC: 3.5 10*3/uL — ABNORMAL LOW (ref 4.0–10.5)
nRBC: 0 % (ref 0.0–0.2)

## 2021-05-18 LAB — COMPREHENSIVE METABOLIC PANEL
ALT: 33 U/L (ref 0–44)
AST: 21 U/L (ref 15–41)
Albumin: 3.4 g/dL — ABNORMAL LOW (ref 3.5–5.0)
Alkaline Phosphatase: 77 U/L (ref 38–126)
Anion gap: 9 (ref 5–15)
BUN: 20 mg/dL (ref 8–23)
CO2: 28 mmol/L (ref 22–32)
Calcium: 9.2 mg/dL (ref 8.9–10.3)
Chloride: 96 mmol/L — ABNORMAL LOW (ref 98–111)
Creatinine, Ser: 0.72 mg/dL (ref 0.44–1.00)
GFR, Estimated: >60 mL/min (ref >60)
Glucose, Bld: 108 mg/dL — ABNORMAL HIGH (ref 70–99)
Potassium: 3.7 mmol/L (ref 3.5–5.1)
Sodium: 133 mmol/L — ABNORMAL LOW (ref 135–145)
Total Bilirubin: 0.4 mg/dL (ref 0.3–1.2)
Total Protein: 5.8 g/dL — ABNORMAL LOW (ref 6.5–8.1)

## 2021-05-18 LAB — BPAM RBC
Blood Product Expiration Date: 202304102359
Blood Product Expiration Date: 202304122359
Blood Product Expiration Date: 202304142359
Blood Product Expiration Date: 202304142359
Blood Product Expiration Date: 202304142359
ISSUE DATE / TIME: 202303202144
ISSUE DATE / TIME: 202303210056
ISSUE DATE / TIME: 202303210100
ISSUE DATE / TIME: 202303210918
ISSUE DATE / TIME: 202303211201
Unit Type and Rh: 6200
Unit Type and Rh: 6200
Unit Type and Rh: 6200
Unit Type and Rh: 6200
Unit Type and Rh: 6200

## 2021-05-18 MED ORDER — SENNOSIDES-DOCUSATE SODIUM 8.6-50 MG PO TABS
2.0000 | ORAL_TABLET | Freq: Two times a day (BID) | ORAL | Status: DC
  Administered 2021-05-18 (×2): 2 via ORAL
  Filled 2021-05-18 (×2): qty 2

## 2021-05-18 NOTE — Progress Notes (Signed)
?  Progress Note  ? ?Date: 05/18/2021 ? ?Patient Name: Ariel [name]        ?MRN#: [id number] ? ?Clarification of diagnosis: ? ?Neoplastic anemia ? ? ?This is chemotherapy-induced anemia. ? ?

## 2021-05-18 NOTE — TOC Initial Note (Signed)
Transition of Care (TOC) - Initial/Assessment Note  ? ? ?Patient Details  ?Name: Ariel [name] ?MRN: [id number] ?Date of Birth: 04-Mar-1955 ? ?Transition of Care (TOC) CM/SW Contact:    ?Pattye Meda, Marjie Skiff, RN ?Phone Number: ?05/18/2021, 1:38 PM ? ?Clinical Narrative:                 ? ? ?Expected Discharge Plan: Home/Self Care ?Barriers to Discharge: Continued Medical Work up ? ? ?Expected Discharge Plan and Services ?Expected Discharge Plan: Home/Self Care ?  ?Discharge Planning Services: CM Consult ?  ?Living arrangements for the past 2 months: [place] ?                ?  ?Prior Living Arrangements/Services ?Living arrangements for the past 2 months: [place] ?Lives with:: Self ?Patient language and need for interpreter reviewed:: Yes ?       ?Need for Family Participation in Patient Care: Yes (Comment) ?Care giver support system in place?: Yes (comment) ?  ?Criminal Activity/Legal Involvement Pertinent to Current Situation/Hospitalization: No - Comment as needed ? ?Activities of Daily Living ?Home Assistive Devices/Equipment: Bedside commode/3-in-1, Eyeglasses, Grab bars around toilet, Grab bars in shower, Raised toilet seat with rails ?ADL Screening (condition at time of admission) ?Patient's cognitive ability adequate to safely complete daily activities?: Yes ?Is the patient deaf or have difficulty hearing?: No ?Does the patient have difficulty seeing, even when wearing glasses/contacts?: No ?Does the patient have difficulty concentrating, remembering, or making decisions?: No ?Patient able to express need for assistance with ADLs?: Yes ?Does the patient have difficulty dressing or bathing?: No ?Independently performs ADLs?: Yes (appropriate for developmental age) ?Does the patient have difficulty walking or climbing stairs?: No ?Weakness of Legs: None ?Weakness of Arms/Hands: None ? ? ?Emotional Assessment ?Appearance:: Appears stated age ?Attitude/Demeanor/Rapport:  Charismatic ?Affect (typically observed): Calm ?Orientation: : Oriented to Self, Oriented to Place, Oriented to  Time, Oriented to Situation ?Alcohol / Substance Use: Not Applicable ?Psych Involvement: No (comment) ? ?Admission diagnosis:  Pancytopenia ([place]) [D61.818] ?Patient Active Problem List  ? Diagnosis Date Noted  ? Pancytopenia ([place]) 05/16/2021  ? 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 Diffuse large B cell lymphoma ([place]) 03/07/2021  ? Encounter for antineoplastic chemotherapy 03/07/2021  ? AMS (altered mental status) 01/14/2021  ? Neutropenia ([place]) 01/14/2021  ? High grade B-cell lymphoma ([place]) 12/22/2020  ? Goals of care, counseling/discussion 12/22/2020  ? Acute metabolic encephalopathy 56/25/6389  ? Brain metastasis ([place]) 12/09/2020  ? Brain tumor ([place]) 12/08/2020  ? Hyponatremia 12/08/2020  ? Night sweat 11/30/2020  ? Chicken pox 11/22/2020  ? Complication of anesthesia 11/22/2020  ? PONV (postoperative nausea and vomiting) 11/22/2020  ? Skin rash 11/10/2020  ? Tachycardia 11/10/2020  ? Anemia 11/10/2020  ? Hyperglycemia 11/10/2020  ? TIA (transient ischemic attack) 10/07/2020  ? Iron deficiency anemia 10/07/2020  ? Aphasia 10/06/2020  ? Melanoma ([place]) 2021  ? Multinodular goiter 12/22/2013  ? Subclinical hyperthyroidism 12/19/2013  ? Urinary incontinence 11/19/2013  ? Hyperlipidemia 11/19/2012  ? Routine general medical examination at a health care facility 11/18/2012  ? ?PCP:  [name], NP ?Pharmacy:   ?[facility] ?[place] [address] ?Phone: [phone] Fax: [phone] ? ? ? ? ?Social Determinants of Health (SDOH) Interventions ?  ? ?Readmission Risk Interventions ? ?  05/18/2021  ?  1:32 PM 05/02/2021  ?  2:25 PM 04/13/2021  ?  3:04 PM  ?Readmission Risk Prevention Plan  ?Transportation Screening Complete Complete Complete  ?PCP or Specialist Appt within 3-5 Days Complete Complete  Complete  ?[place] or Home Care Consult Complete Complete Complete  ?Social Work Consult for [place] Planning/Counseling Complete Complete Complete  ?Palliative Care Screening Not Applicable Not Applicable Not Applicable  ?Medication Review Press photographer) Complete Complete Complete  ? ? ? ?

## 2021-05-18 NOTE — Progress Notes (Addendum)
[name] is feeling a lot better.  She had 2 more units of blood yesterday.  I do not have back the labs yet this morning. ? ?Her appetite is doing better.  She is having no nausea or vomiting.  There is no bleeding.  We will go ahead and stop the Amicar mouth rinse.  Her platelet count was 55,000 yesterday. ? ?She has had no problems with bowels or bladder.  There is no diarrhea.  There is no bleeding.  She has had no fever.  She has had no mucositis.  She has had no cough or shortness of breath.  There is no headache. ? ?Her vital signs show temperature 97.9 pulse 99.  Blood pressure 110/57.  Her head neck exam shows no oral mucositis.  There is no oral bleeding.  Lungs are clear.  Cardiac exam regular rate and rhythm.  Abdomen is soft.  Bowel sounds are present.  There is no guarding or rebound tenderness.  Extremity shows the come pression devices on her legs.  Neurological exam shows no focal neurological deficits.  Skin exam shows no rashes. ? ?She had a wonderful urine output yesterday.  She had 3000 cc out. ? ?We will have to see what the labs look like.  If we see continued increase in her blood counts, we might be able to get her home tomorrow. ? ?She has chemotherapy-induced pancytopenia.  This is all from the [facility] protocol that she received. ? ?She is improved markedly.  I had to believe this is from the white cell count coming up. ? ?Thankfully, there is no obvious infection that we can see. ? ?She is eating better which is a good sign. ? ?I know that the staff on [address] are doing a great job. ? ?[name], MD ? ?Romans 1:16 ?

## 2021-05-19 ENCOUNTER — Encounter: Payer: Self-pay | Admitting: *Deleted

## 2021-05-19 LAB — COMPREHENSIVE METABOLIC PANEL
ALT: 30 U/L (ref 0–44)
AST: 23 U/L (ref 15–41)
Albumin: 3.3 g/dL — ABNORMAL LOW (ref 3.5–5.0)
Alkaline Phosphatase: 82 U/L (ref 38–126)
Anion gap: 4 — ABNORMAL LOW (ref 5–15)
BUN: 16 mg/dL (ref 8–23)
CO2: 28 mmol/L (ref 22–32)
Calcium: 9.2 mg/dL (ref 8.9–10.3)
Chloride: 103 mmol/L (ref 98–111)
Creatinine, Ser: 0.75 mg/dL (ref 0.44–1.00)
GFR, Estimated: >60 mL/min (ref >60)
Glucose, Bld: 131 mg/dL — ABNORMAL HIGH (ref 70–99)
Potassium: 3.7 mmol/L (ref 3.5–5.1)
Sodium: 135 mmol/L (ref 135–145)
Total Bilirubin: 0.2 mg/dL — ABNORMAL LOW (ref 0.3–1.2)
Total Protein: 5.5 g/dL — ABNORMAL LOW (ref 6.5–8.1)

## 2021-05-19 LAB — CBC WITH DIFFERENTIAL/PLATELET
Abs Immature Granulocytes: 1.6 10*3/uL — ABNORMAL HIGH (ref 0.00–0.07)
Basophils Absolute: 0 10*3/uL (ref 0.0–0.1)
Basophils Relative: 0 %
Eosinophils Absolute: 0 10*3/uL (ref 0.0–0.5)
Eosinophils Relative: 0 %
HCT: 25.6 % — ABNORMAL LOW (ref 36.0–46.0)
Hemoglobin: 8.8 g/dL — ABNORMAL LOW (ref 12.0–15.0)
Lymphocytes Relative: 9 %
Lymphs Abs: 0.9 10*3/uL (ref 0.7–4.0)
MCH: 28.9 pg (ref 26.0–34.0)
MCHC: 34.4 g/dL (ref 30.0–36.0)
MCV: 84.2 fL (ref 80.0–100.0)
Metamyelocytes Relative: 4 %
Monocytes Absolute: 2.7 10*3/uL — ABNORMAL HIGH (ref 0.1–1.0)
Monocytes Relative: 28 %
Myelocytes: 12 %
Neutro Abs: 4.4 10*3/uL (ref 1.7–7.7)
Neutrophils Relative %: 46 %
Platelets: 44 10*3/uL — ABNORMAL LOW (ref 150–400)
Promyelocytes Relative: 1 %
RBC: 3.04 MIL/uL — ABNORMAL LOW (ref 3.87–5.11)
RDW: 15.2 % (ref 11.5–15.5)
WBC: 9.5 10*3/uL (ref 4.0–10.5)
nRBC: 0 % (ref 0.0–0.2)

## 2021-05-19 MED ORDER — SODIUM CHLORIDE 0.9% FLUSH
10.0000 mL | INTRAVENOUS | Status: DC | PRN
  Administered 2021-05-19: 10 mL

## 2021-05-19 MED ORDER — SENNOSIDES-DOCUSATE SODIUM 8.6-50 MG PO TABS: 2.0000 | ORAL_TABLET | Freq: Two times a day (BID) | ORAL | Status: DC | PRN

## 2021-05-19 MED ORDER — SODIUM CHLORIDE 0.9% FLUSH: 10.0000 mL | Freq: Two times a day (BID) | INTRAVENOUS | Status: DC

## 2021-05-19 MED ORDER — HEPARIN SOD (PORK) LOCK FLUSH 100 UNIT/ML IV SOLN
500.0000 [IU] | INTRAVENOUS | Status: AC | PRN
  Administered 2021-05-19: 500 [IU]

## 2021-05-19 MED ORDER — CHLORHEXIDINE GLUCONATE CLOTH 2 % EX PADS: 6.0000 | MEDICATED_PAD | Freq: Every day | CUTANEOUS | Status: DC

## 2021-05-19 NOTE — Progress Notes (Signed)
Patient discharged today from the hospital. She has a PET scheduled for 05/25/2021. Will follow up on results.  ? ?Oncology Nurse Navigator Documentation ? ? ?  05/19/2021  ?  2:15 PM  ?Oncology Nurse Navigator Flowsheets  ?Navigator Follow Up Date: 05/25/2021  ?Navigator Follow Up Reason: Scan Review  ?Navigator Location [facility]  ?Navigator Encounter Type Appt/Treatment Plan Review  ?Patient Visit Type MedOnc  ?Treatment Phase Active Tx  ?Barriers/Navigation Needs Coordination of Care;Education  ?Interventions None Required  ?Acuity Level 2-Minimal Needs (1-2 Barriers Identified)  ?Support Groups/Services Friends and Family  ?Time Spent with Patient 15  ?  ?

## 2021-05-19 NOTE — Discharge Summary (Signed)
Physician Discharge Summary  ?Patient ID: ?Ariel [name] ?'@[email or web address]'$ @ ?MRN: [id number] ?DOB/AGE: 1955-11-01 66 y.o. ? ?Admit date: 05/16/2021 ?Discharge date: 05/19/2021 ? ?Admission Diagnoses: ?Chemotherapy induced pancytopenia ? ?Discharge Diagnoses:  ?Principal Problem: ?  Pancytopenia ([place]) ? ? ?Discharged Condition: good ? ?Discharge Labs:  ? ?Significant Diagnostic Studies: labs:  ? ?Consults: None ? ?Disposition: Discharge disposition: 03-Skilled Nursing Facility ? ? ? ? ? ? ?Treatments: Patient received blood and platelet transfusions ? ? ? ? Follow-up Information   ? ? Volanda Napoleon, MD Follow up.   ?Specialty: Oncology ?Why: keep your appt as previously scheduled ?Contact information: ?[address] ?[address] ?[place] [address] ?[phone] ? ? ?  ?  ? ?  ?  ? ?  ? ? ?Hospital Course: Ariel [name] was admitted from the office on 05/16/2021.  She had profound pancytopenia from chemotherapy.  She had significant gingival bleeding.  She is not bleeding from anywhere else.  When she came in, her white count was 200.  Her hemoglobin was 4.8.  Platelet count less than 5000. ? ?Upon admission, her Port-A-Cath was accessed.  She was given IV fluid.  She had no temperature so she was not put on any IV antibiotics.  She was already on ciprofloxacin and Diflucan along with acyclovir. ? ?She did get 2 units of packed red blood cells and 1 unit of platelets when she was admitted.  The next day, her white cell count was 700.  Her hemoglobin was 6.8 and platelet count 55,000. ? ?I gave her 2 more units of packed red blood cells. ? ?She did not receive any commonly stimulating factors since she is gone Neulasta in the office. ? ?Her white cell count came up quite nicely.  The following day, her white cell count was 3500.  Her hemoglobin was 8.8.  Platelet count was 41,000. ? ?She was put on some Amicar mouth rinse when she came in.  We were able to stop this as she had no further oral bleeding. ? ?She had a  better appetite as her blood count came back.  She had no diarrhea.  She had no cough or shortness of breath.  She was put on sequential compression devices for her legs. ? ?She began to feel much better.  She began to ambulate a little bit. ? ?On the day of discharge, her white cell count was 9.5.  Hemoglobin 8.8.  Platelet count 44,000.  Sodium was 135.  Potassium 3.7.  BUN 16 creatinine 0.75.  Her albumin was 3.3.  Bilirubin 0.2. ? ?Upon her discharge, her vital signs show temperature 98.5.  Pulse 83.  Blood pressure 105/50.  Oxygen saturation was 97% on room air.  Her head and neck exam showed no mucositis.  There is no gingival bleeding.  She had no palatal petechia.  She had no scleral icterus.  Lungs were clear bilaterally.  She had good air movement bilaterally.  Cardiac exam regular rate and rhythm.  Abdomen is soft.  She had good bowel sounds.  Extremity shows no clubbing, cyanosis or edema.  Skin exam showed resolution of the petechia on her legs.  Neurological exam showed no focal neurological deficits. ? ? ? ?Discharge Exam: ?Blood pressure (!) 105/50, pulse 83, temperature 98.5 ?F (36.9 ?C), temperature source Oral, resp. rate 20, height '5\' 7"'$  (1.702 m), weight 149 lb (67.6 kg), SpO2 97 %. ?See above ? ? ? ?Signed: ?Volanda Napoleon ?05/19/2021, 7:10 AM  ?

## 2021-05-19 NOTE — Plan of Care (Signed)
Discharge instructions and report were given to facility. All questions were answered. Patient was transported to facility by [facility]. ? ?

## 2021-05-25 ENCOUNTER — Encounter (HOSPITAL_COMMUNITY)
Admission: RE | Admit: 2021-05-25 | Discharge: 2021-05-25 | Disposition: A | Discharge status: Home / Self Care | Payer: Medicare Other | Source: Ambulatory Visit | Attending: Hematology & Oncology | Admitting: Hematology & Oncology

## 2021-05-25 DIAGNOSIS — C8510 Unspecified B-cell lymphoma, unspecified site: Secondary | ICD-10-CM | POA: Diagnosis not present

## 2021-05-25 LAB — GLUCOSE, CAPILLARY: Glucose-Capillary: 107 mg/dL — ABNORMAL HIGH (ref 70–99)

## 2021-05-25 IMAGING — PT NM PET TUM IMG RESTAG (PS) SKULL BASE T - THIGH
7 series · 25 of 25 positions shown · non-contrast
Comparison: PET-CT [DATE] PET-CT [DATE]

CLINICAL DATA: Subsequent treatment strategy for high-grade B-cell
lymphoma. Finished chemotherapy 2 weeks ago.

EXAM:
NUCLEAR MEDICINE PET SKULL BASE TO THIGH
TECHNIQUE: 7.2 mCi F-18 FDG was injected intravenously. Full-ring PET imaging
was performed from the skull base to thigh after the radiotracer. CT
data was obtained and used for attenuation correction and anatomic
localization.
Fasting blood glucose: 107 mg/dl

[Series 3: pet sk_thigh ac · axial · 5.0mm · 4.07mm/px · z∈[-871,+61]mm · 6 of 234 slices shown]
[im 1/234]
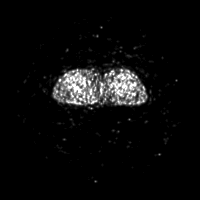
[im 47/234]
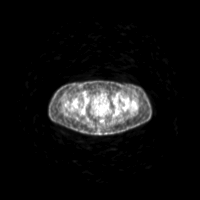
[im 94/234]
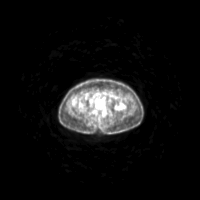
[im 140/234]
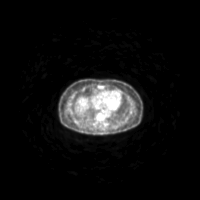
[im 187/234]
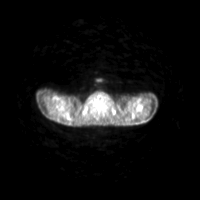
[im 234/234]
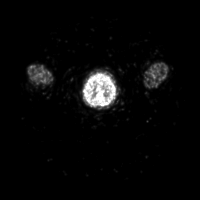

[Series 4: ct sk_thigh 5.0 bf37 · axial · 5.0mm · 0.98mm/px · z∈[-871,+61]mm · 5 of 234 slices shown]
[im 1/234]
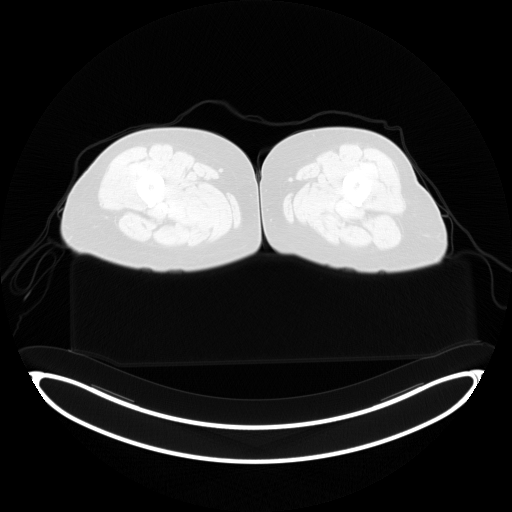
[im 59/234]
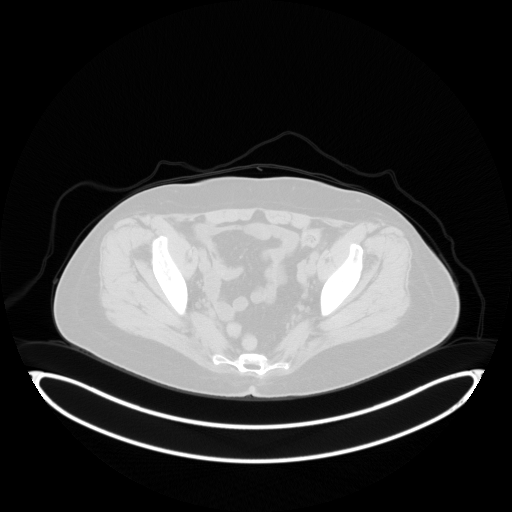
[im 117/234]
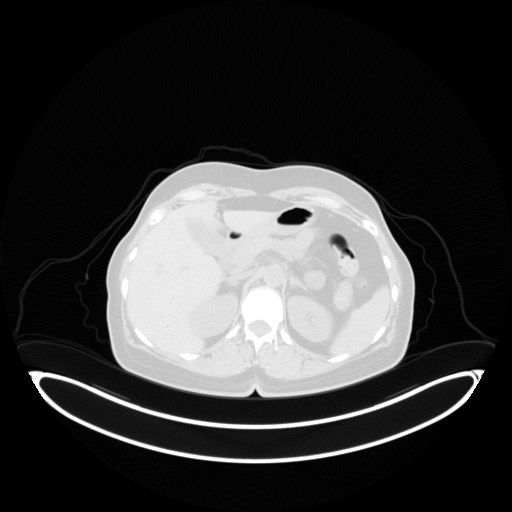
[im 175/234]
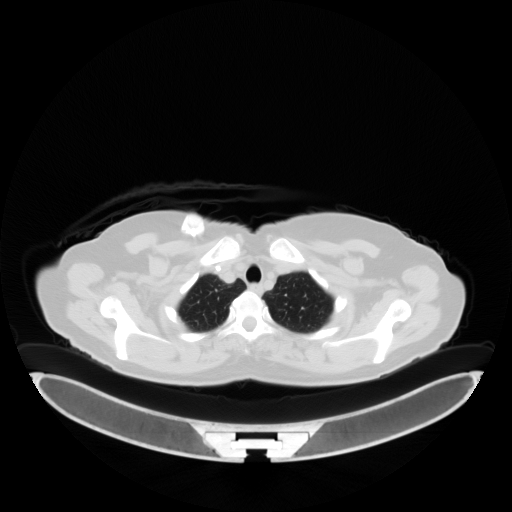
[im 234/234  brain]
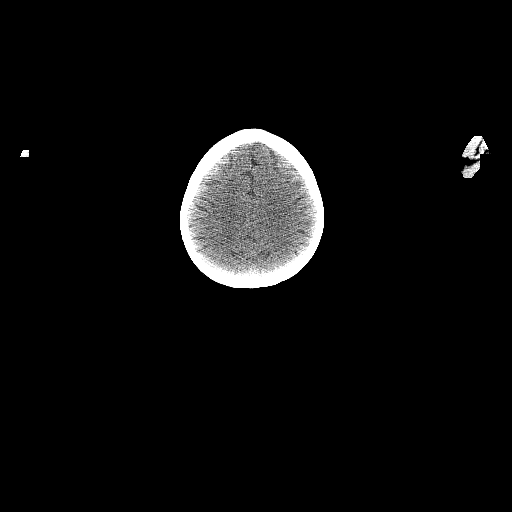

[Series 5: pet sk_thigh nac · axial · 5.0mm · 4.07mm/px · z∈[-871,+61]mm · 5 of 234 slices shown]
[im 1/234]
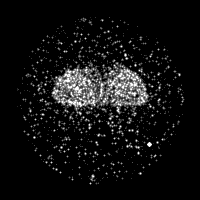
[im 59/234]
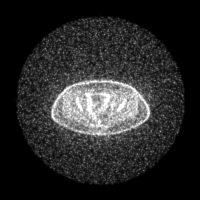
[im 117/234]
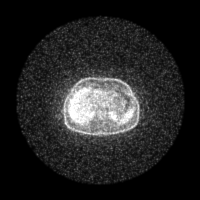
[im 175/234]
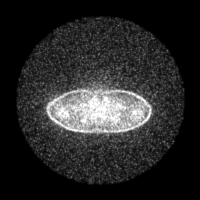
[im 234/234]
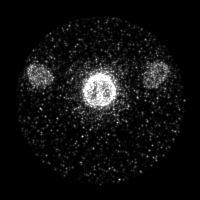

[Series 8: ct sk_thigh 5.0 br59 lung_bone · axial · 5.0mm · 0.65mm/px · z∈[-431,-123]mm · 2 of 78 slices shown]
[im 1/78]
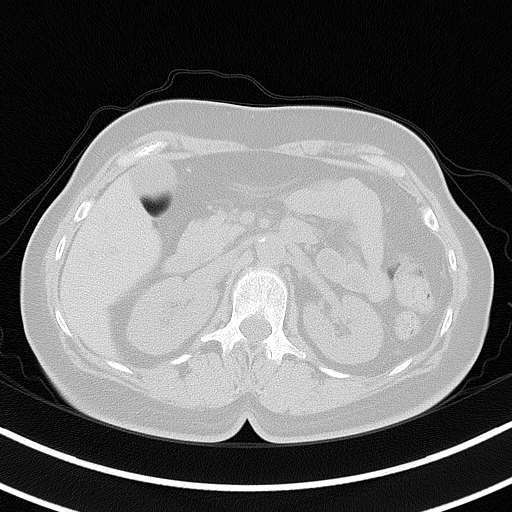
[im 78/78]
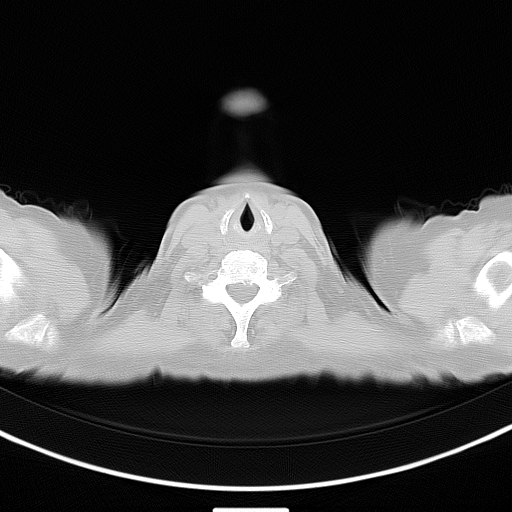

[Series 603: <mip collection> · coronal · 1.93mm/px · 1 of 32 slices shown]
[im 1/32]
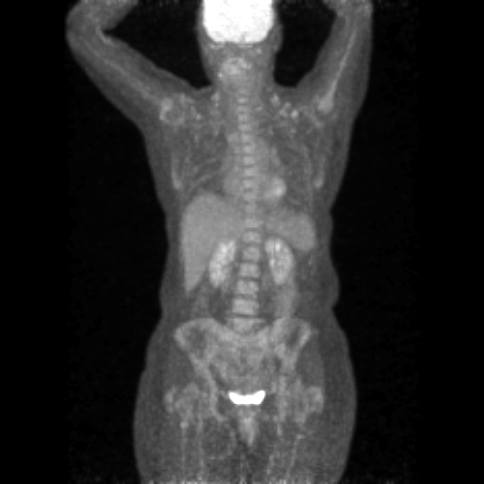

[Series 604: fused cor · 1 of 42 slices shown]
[im 1/42]
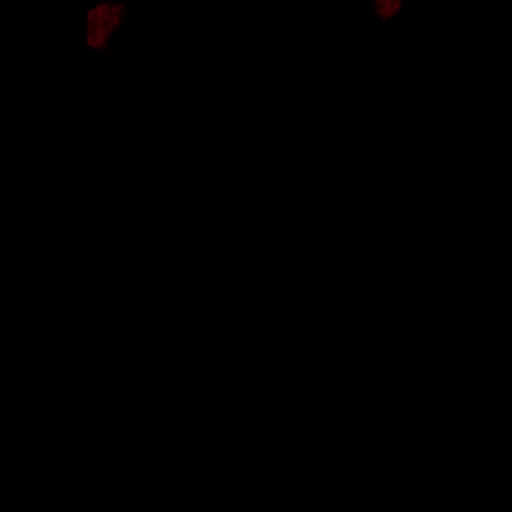

[Series 605: range-ct sk_thigh 5.0 bf37-tra-<alpha range> · 5 of 228 slices shown]
[im 1/228]
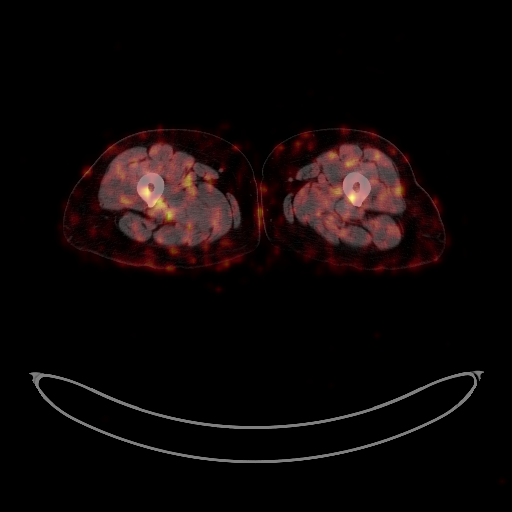
[im 57/228]
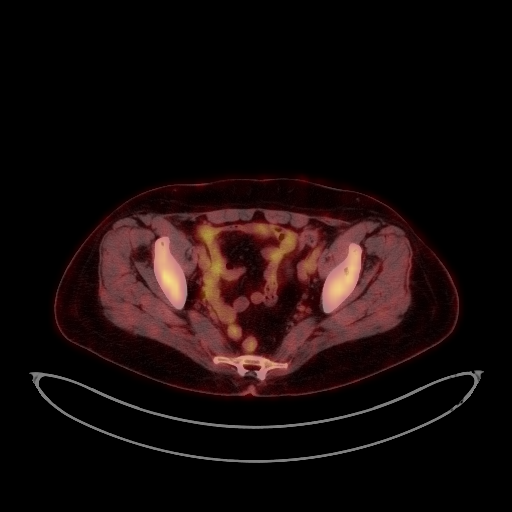
[im 114/228]
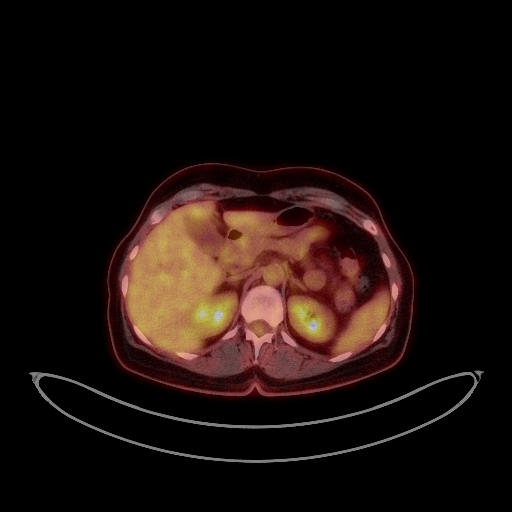
[im 171/228]
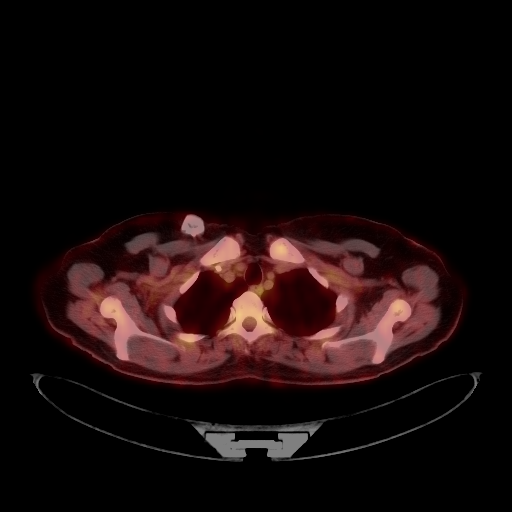
[im 228/228]
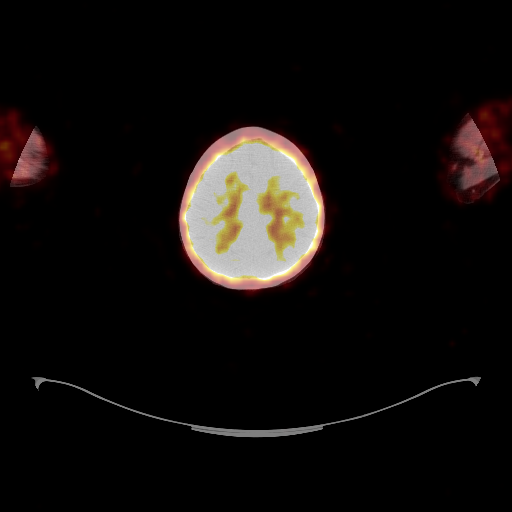

[25 of 25 positions shown; findings below may reference images not displayed]

FINDINGS: Mediastinal blood pool activity: SUV max

Liver activity: SUV max

NECK: No hypermetabolic lymph nodes in the neck. No residual
hypermetabolism in the right thyroid lobe.

Incidental CT findings: None

CHEST: No hypermetabolic mediastinal or hilar nodes. No suspicious
pulmonary nodules on the CT scan. Area of hypermetabolism in the
left hilum on the prior study is no longer identified. No
supraclavicular or axillary adenopathy and no hypermetabolic breast
lesions. Incidental hypermetabolic brown fat noted in the
supraclavicular fossa regions.

Incidental CT findings: None

ABDOMEN/PELVIS: No abnormal hypermetabolic activity within the
liver, pancreas, adrenal glands, or spleen. No hypermetabolic lymph
nodes in the abdomen or pelvis.

Incidental CT findings: Stable low-attenuation right adrenal gland
nodule consistent with benign adenoma. Scattered vascular
calcifications are stable.

SKELETON: Diffuse increased marrow activity likely due to rebound
from chemotherapy or marrow stimulating drugs. No discrete
hypermetabolic bone lesions.

Incidental CT findings: none
IMPRESSION: No PET-CT findings for residual or recurrent hypermetabolic
lymphadenopathy ([HOSPITAL] 1).

## 2021-05-25 MED ORDER — FLUDEOXYGLUCOSE F - 18 (FDG) INJECTION
7.4000 | Freq: Once | INTRAVENOUS | Status: AC | PRN
  Administered 2021-05-25: 7.2 via INTRAVENOUS

## 2021-05-26 ENCOUNTER — Inpatient Hospital Stay (HOSPITAL_BASED_OUTPATIENT_CLINIC_OR_DEPARTMENT_OTHER): Payer: Medicare Other | Admitting: Hematology & Oncology

## 2021-05-26 ENCOUNTER — Other Ambulatory Visit: Payer: Self-pay | Admitting: Pharmacist

## 2021-05-26 ENCOUNTER — Inpatient Hospital Stay: Payer: Medicare Other

## 2021-05-26 ENCOUNTER — Encounter: Payer: Self-pay | Admitting: *Deleted

## 2021-05-26 ENCOUNTER — Other Ambulatory Visit: Payer: Self-pay

## 2021-05-26 ENCOUNTER — Encounter: Payer: Self-pay | Admitting: Hematology & Oncology

## 2021-05-26 VITALS — BP 129/47 | HR 73 | Temp 98.2°F | Resp 18 | Ht 67.0 in | Wt 152.0 lb

## 2021-05-26 DIAGNOSIS — C8510 Unspecified B-cell lymphoma, unspecified site: Secondary | ICD-10-CM

## 2021-05-26 DIAGNOSIS — Z95828 Presence of other vascular implants and grafts: Secondary | ICD-10-CM

## 2021-05-26 DIAGNOSIS — Z5189 Encounter for other specified aftercare: Secondary | ICD-10-CM | POA: Diagnosis not present

## 2021-05-26 DIAGNOSIS — D509 Iron deficiency anemia, unspecified: Secondary | ICD-10-CM | POA: Diagnosis not present

## 2021-05-26 DIAGNOSIS — C8330 Diffuse large B-cell lymphoma, unspecified site: Secondary | ICD-10-CM | POA: Diagnosis not present

## 2021-05-26 LAB — CBC WITH DIFFERENTIAL (CANCER CENTER ONLY)
Abs Immature Granulocytes: 0.34 10*3/uL — ABNORMAL HIGH (ref 0.00–0.07)
Basophils Absolute: 0 10*3/uL (ref 0.0–0.1)
Basophils Relative: 0 %
Eosinophils Absolute: 0 10*3/uL (ref 0.0–0.5)
Eosinophils Relative: 0 %
HCT: 27.1 % — ABNORMAL LOW (ref 36.0–46.0)
Hemoglobin: 8.9 g/dL — ABNORMAL LOW (ref 12.0–15.0)
Immature Granulocytes: 6 %
Lymphocytes Relative: 11 %
Lymphs Abs: 0.6 10*3/uL — ABNORMAL LOW (ref 0.7–4.0)
MCH: 29 pg (ref 26.0–34.0)
MCHC: 32.8 g/dL (ref 30.0–36.0)
MCV: 88.3 fL (ref 80.0–100.0)
Monocytes Absolute: 1 10*3/uL (ref 0.1–1.0)
Monocytes Relative: 20 %
Neutro Abs: 3.3 10*3/uL (ref 1.7–7.7)
Neutrophils Relative %: 63 %
Platelet Count: 344 10*3/uL (ref 150–400)
RBC: 3.07 MIL/uL — ABNORMAL LOW (ref 3.87–5.11)
RDW: 16.1 % — ABNORMAL HIGH (ref 11.5–15.5)
WBC Count: 5.3 10*3/uL (ref 4.0–10.5)
nRBC: 0.8 % — ABNORMAL HIGH (ref 0.0–0.2)

## 2021-05-26 LAB — CMP (CANCER CENTER ONLY)
ALT: 24 U/L (ref 0–44)
AST: 19 U/L (ref 15–41)
Albumin: 4 g/dL (ref 3.5–5.0)
Alkaline Phosphatase: 86 U/L (ref 38–126)
Anion gap: 8 (ref 5–15)
BUN: 9 mg/dL (ref 8–23)
CO2: 29 mmol/L (ref 22–32)
Calcium: 9.7 mg/dL (ref 8.9–10.3)
Chloride: 101 mmol/L (ref 98–111)
Creatinine: 0.72 mg/dL (ref 0.44–1.00)
GFR, Estimated: >60 mL/min (ref >60)
Glucose, Bld: 126 mg/dL — ABNORMAL HIGH (ref 70–99)
Potassium: 3.9 mmol/L (ref 3.5–5.1)
Sodium: 138 mmol/L (ref 135–145)
Total Bilirubin: 0.3 mg/dL (ref 0.3–1.2)
Total Protein: 6.2 g/dL — ABNORMAL LOW (ref 6.5–8.1)

## 2021-05-26 LAB — LACTATE DEHYDROGENASE: LDH: 216 U/L — ABNORMAL HIGH (ref 98–192)

## 2021-05-26 MED ORDER — SODIUM CHLORIDE 0.9% FLUSH
10.0000 mL | Freq: Once | INTRAVENOUS | Status: AC
  Administered 2021-05-26: 10 mL via INTRAVENOUS

## 2021-05-26 MED ORDER — HEPARIN SOD (PORK) LOCK FLUSH 100 UNIT/ML IV SOLN
500.0000 [IU] | Freq: Once | INTRAVENOUS | Status: AC
  Administered 2021-05-26: 500 [IU] via INTRAVENOUS

## 2021-05-26 NOTE — Patient Instructions (Signed)

## 2021-05-26 NOTE — Progress Notes (Signed)
?Hematology and Oncology Follow Up Visit ? ?Ariel [name] ?[id number] ?1955/05/07 66 y.o. ?05/26/2021 ? ? ?Principle Diagnosis:  ?Diffuse large cell non-Hodgkin's lymphoma-CNS involvement ? ?Current Therapy:   ?Status post chemotherapy with R-ICE/R-MTV --  s/p cycle #4 ?MATRIX -- s/p cycle#1 -- start on 05/02/2021 ?Neulasta 6 mg subcu post chemotherapy ?    ?Interim History:  Ariel [name] is comes in for follow-up.  She looks fantastic.  The last time that we saw her in the office, she was incredibly pancytopenic from chemotherapy.  We had to admit her.  Thankfully, her blood counts recovered relatively quickly without any evidence of infection. ? ?We did do a PET scan on her.  This was done yesterday.  The PET scan showed no evidence of systemic disease. ? ?She is looking forward to her daughter coming up from [place].  She is going up tomorrow and be here for a week and then Easter Sunday.  I know that she is really looking forward to this. ? ?She feels good.  She is eating well.  She is having no problems with bowels or bladder.  She is having no issues with cough or shortness of breath.  She has had no rashes.  There is been no leg swelling.  She does have arthritis in her knees and occasionally, her left knee does swell up. ? ?She has had no headache.  There is no visual changes. ? ?Overall, I would say performance status is ECOG 0.   ? ?Medications:  ?Current Outpatient Medications:  ?  acetaminophen (TYLENOL) 500 MG tablet, Take 500-1,000 mg by mouth every 6 (six) hours as needed for mild pain or headache., Disp: , Rfl:  ?  acyclovir (ZOVIRAX) 200 MG capsule, Take 1 capsule (200 mg total) by mouth 2 (two) times daily., Disp: 60 capsule, Rfl: 3 ?  antiseptic oral rinse (BIOTENE) LIQD, 15 mLs by Mouth Rinse route every 4 (four) hours. (Patient taking differently: 15 mLs by Mouth Rinse route every 4 (four) hours as needed for dry mouth or mouth pain.), Disp: 500 mL, Rfl: 4 ?  docusate sodium (COLACE) 100 MG capsule,  Take 1 capsule (100 mg total) by mouth 2 (two) times daily as needed for mild constipation. (Patient taking differently: Take 100 mg by mouth at bedtime.), Disp: 10 capsule, Rfl: 0 ?  lidocaine-prilocaine (EMLA) cream, Apply to affected area once, Disp: 30 g, Rfl: 3 ?  ondansetron (ZOFRAN) 8 MG tablet, Take 8 mg by mouth every 8 (eight) hours as needed for nausea or vomiting., Disp: , Rfl:  ?  OVER THE COUNTER MEDICATION, Take 3 capsules by mouth See admin instructions. Balance of Nature VEGETABLE formulation capsules- Take 3 capsules by mouth every morning, Disp: , Rfl:  ?  OVER THE COUNTER MEDICATION, Take 3 capsules by mouth See admin instructions. Balance of Nature FRUIT formulation capsules- Take 3 capsules by mouth every morning, Disp: , Rfl:  ?  Sodium Chloride-Sodium Bicarb (SODIUM BICARBONATE/SODIUM CHLORIDE) SOLN, 30 application by Mouth Rinse route every 4 (four) hours. (Patient taking differently: 1 application. by Mouth Rinse route every 4 (four) hours as needed for mouth pain.), Disp: 1000 mL, Rfl: 3 ?  VUITY 1.25 % SOLN, Place 1 drop into both eyes 2 (two) times daily as needed (for dryness)., Disp: , Rfl:  ? ?Allergies:  ?Allergies  ?Allergen Reactions  ? Decadron [Dexamethasone] Other (See Comments)  ?  Made the patient's legs tired and she could not walk  ? Plavix [Clopidogrel] Hives, Swelling and  Other (See Comments)  ?  Lips became swollen ?  ? Prednisone Other (See Comments)  ?  Steroids caused psychological issues   ? Codeine Nausea Only  ? ? ?Past Medical History, Surgical history, Social history, and Family History were reviewed and updated. ? ?Review of Systems: ?Review of Systems  ?Constitutional: Negative.   ?HENT:  Negative.    ?Eyes: Negative.   ?Respiratory: Negative.    ?Cardiovascular: Negative.   ?Gastrointestinal: Negative.   ?Endocrine: Negative.   ?Genitourinary: Negative.    ?Musculoskeletal: Negative.   ?Skin: Negative.   ?Neurological: Negative.   ?Hematological: Negative.    ?Psychiatric/Behavioral: Negative.    ? ?Physical Exam: ? height is '5\' 7"'$  (1.702 m) and weight is 152 lb (68.9 kg). Her oral temperature is 98.2 ?F (36.8 ?C). Her blood pressure is 129/47 (abnormal) and her pulse is 73. Her respiration is 18 and oxygen saturation is 100%.  ? ?Wt Readings from Last 3 Encounters:  ?05/26/21 152 lb (68.9 kg)  ?05/16/21 149 lb (67.6 kg)  ?05/16/21 149 lb (67.6 kg)  ? ? ?Physical Exam ?Vitals reviewed.  ?HENT:  ?   Head: Normocephalic and atraumatic.  ?   Mouth/Throat:  ?   Comments: Oral exam shows multiple sites of gingival bleeding.  She has gauze in her mouth to try to help with the bleeding.  There is no obvious gingival breakdown. ?Eyes:  ?   Pupils: Pupils are equal, round, and reactive to light.  ?Cardiovascular:  ?   Rate and Rhythm: Normal rate and regular rhythm.  ?   Heart sounds: Normal heart sounds.  ?   Comments: Cardiac exam is regular rate and rhythm with no murmurs, rubs or bruits. ?Pulmonary:  ?   Effort: Pulmonary effort is normal.  ?   Breath sounds: Normal breath sounds.  ?Abdominal:  ?   General: Bowel sounds are normal.  ?   Palpations: Abdomen is soft.  ?Musculoskeletal:     ?   General: No tenderness or deformity. Normal range of motion.  ?   Cervical back: Normal range of motion.  ?Lymphadenopathy:  ?   Cervical: No cervical adenopathy.  ?Skin: ?   General: Skin is warm and dry.  ?   Findings: No erythema or rash.  ?   Comments: Skin exam does show petechia on her lower legs.  ?Neurological:  ?   Mental Status: She is alert and oriented to person, place, and time.  ?   Comments: Neurological exam shows no focal neurological deficits.  All cranial nerves are intact.  She has good strength in upper and lower extremities.  ?Psychiatric:     ?   Behavior: Behavior normal.     ?   Thought Content: Thought content normal.     ?   Judgment: Judgment normal.  ? ? ? ?Lab Results  ?Component Value Date  ? WBC 5.3 05/26/2021  ? HGB 8.9 (L) 05/26/2021  ? HCT 27.1 (L)  05/26/2021  ? MCV 88.3 05/26/2021  ? PLT 344 05/26/2021  ? ?  Chemistry   ?   ?Component Value Date/Time  ? NA 138 05/26/2021 1318  ? K 3.9 05/26/2021 1318  ? CL 101 05/26/2021 1318  ? CO2 29 05/26/2021 1318  ? BUN 9 05/26/2021 1318  ? CREATININE 0.72 05/26/2021 1318  ? CREATININE 0.68 11/18/2012 1439  ?    ?Component Value Date/Time  ? CALCIUM 9.7 05/26/2021 1318  ? ALKPHOS 86 05/26/2021 1318  ? AST  19 05/26/2021 1318  ? ALT 24 05/26/2021 1318  ? BILITOT 0.3 05/26/2021 1318  ?  ? ? ?Impression and Plan: ?Ariel [name] is a very charming 66 year old white female.  She initially came to [place] with iron deficiency anemia.  We subsequently found that she had diffuse large cell non-Hodgkin's lymphoma.  It had traveled to her brain. ? ?She has had 4 cycles of treatment.  We basically gave her R-ICE alternating with R-MTV.  She tolerated this very well. ? ?We gave her MATRIX protocol in the hospital about 3 weeks ago.   ? ? ?We will have to set her up with her next admission right after Easter.  I will put her in on April 10.  Again she will get the MATRIX protocol.  We may have to make some adjustments to the  Ara-C. ? ?I am just happy that she is has responded well.  I am not surprised by this in all honesty. ? ?We will repeat a brain MRI after this next cycle of MATRIX.  Hopefully we will see that she does not have any CNS disease. ? ?I know that she will have a wonderful week with her daughter.  Her son lives up here so they would be nice to see the family all together.   ? ?Volanda Napoleon, MD ?3/30/20231:56 PM  ?

## 2021-05-26 NOTE — Progress Notes (Signed)
Patient had PET scan yesterday, however results not yet posted. Called radiology room to have scan priority moved up in queue for todays appointment.  ? ?PET resulted posted.  ? ?Patient will be admitted on 06/06/21 for MATRIX protocol. Bed placement (Shawn) called. Message sent to IP Chemo Team.  ? ?Oncology Nurse Navigator Documentation ? ? ?  05/26/2021  ?  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  ? ? ? ? ?

## 2021-06-03 ENCOUNTER — Telehealth: Payer: Self-pay | Admitting: *Deleted

## 2021-06-03 ENCOUNTER — Other Ambulatory Visit: Payer: Self-pay | Admitting: Hematology & Oncology

## 2021-06-03 DIAGNOSIS — C8510 Unspecified B-cell lymphoma, unspecified site: Secondary | ICD-10-CM

## 2021-06-03 NOTE — Telephone Encounter (Signed)
I called patient and "informed her that she needs to be at [facility] on Monday, April 10th at 8:00 am." She verbalized understanding.  ?

## 2021-06-06 ENCOUNTER — Encounter (HOSPITAL_COMMUNITY): Payer: Self-pay | Admitting: Hematology & Oncology

## 2021-06-06 ENCOUNTER — Other Ambulatory Visit: Payer: Self-pay

## 2021-06-06 ENCOUNTER — Inpatient Hospital Stay (HOSPITAL_COMMUNITY)
Admission: RE | Admit: 2021-06-06 | Discharge: 2021-06-11 | DRG: 847 | Disposition: A | Discharge status: Home / Self Care | Payer: Medicare Other | Source: Ambulatory Visit | Attending: Hematology & Oncology | Admitting: Hematology & Oncology

## 2021-06-06 ENCOUNTER — Encounter: Payer: Self-pay | Admitting: *Deleted

## 2021-06-06 DIAGNOSIS — Z79899 Other long term (current) drug therapy: Secondary | ICD-10-CM | POA: Diagnosis not present

## 2021-06-06 DIAGNOSIS — Z885 Allergy status to narcotic agent status: Secondary | ICD-10-CM

## 2021-06-06 DIAGNOSIS — R110 Nausea: Secondary | ICD-10-CM | POA: Diagnosis not present

## 2021-06-06 DIAGNOSIS — Z9221 Personal history of antineoplastic chemotherapy: Secondary | ICD-10-CM | POA: Diagnosis not present

## 2021-06-06 DIAGNOSIS — Z5111 Encounter for antineoplastic chemotherapy: Principal | ICD-10-CM

## 2021-06-06 DIAGNOSIS — C8330 Diffuse large B-cell lymphoma, unspecified site: Secondary | ICD-10-CM | POA: Diagnosis not present

## 2021-06-06 DIAGNOSIS — C8510 Unspecified B-cell lymphoma, unspecified site: Secondary | ICD-10-CM | POA: Diagnosis not present

## 2021-06-06 DIAGNOSIS — C8338 Diffuse large B-cell lymphoma, lymph nodes of multiple sites: Secondary | ICD-10-CM | POA: Diagnosis not present

## 2021-06-06 DIAGNOSIS — Z888 Allergy status to other drugs, medicaments and biological substances status: Secondary | ICD-10-CM

## 2021-06-06 DIAGNOSIS — C8332 Diffuse large B-cell lymphoma, intrathoracic lymph nodes: Secondary | ICD-10-CM

## 2021-06-06 DIAGNOSIS — C8334 Diffuse large B-cell lymphoma, lymph nodes of axilla and upper limb: Secondary | ICD-10-CM

## 2021-06-06 DIAGNOSIS — C8331 Diffuse large B-cell lymphoma, lymph nodes of head, face, and neck: Secondary | ICD-10-CM | POA: Diagnosis not present

## 2021-06-06 DIAGNOSIS — D61818 Other pancytopenia: Secondary | ICD-10-CM

## 2021-06-06 DIAGNOSIS — D509 Iron deficiency anemia, unspecified: Secondary | ICD-10-CM | POA: Diagnosis not present

## 2021-06-06 DIAGNOSIS — C8333 Diffuse large B-cell lymphoma, intra-abdominal lymph nodes: Secondary | ICD-10-CM

## 2021-06-06 LAB — TSH: TSH: 1.475 u[IU]/mL (ref 0.350–4.500)

## 2021-06-06 LAB — CBC
HCT: 32.9 % — ABNORMAL LOW (ref 36.0–46.0)
Hemoglobin: 10.8 g/dL — ABNORMAL LOW (ref 12.0–15.0)
MCH: 30.9 pg (ref 26.0–34.0)
MCHC: 32.8 g/dL (ref 30.0–36.0)
MCV: 94.3 fL (ref 80.0–100.0)
Platelets: 245 K/uL (ref 150–400)
RBC: 3.49 MIL/uL — ABNORMAL LOW (ref 3.87–5.11)
RDW: 20.8 % — ABNORMAL HIGH (ref 11.5–15.5)
WBC: 3.7 K/uL — ABNORMAL LOW (ref 4.0–10.5)
nRBC: 0 % (ref 0.0–0.2)

## 2021-06-06 LAB — COMPREHENSIVE METABOLIC PANEL
ALT: 21 U/L (ref 0–44)
AST: 23 U/L (ref 15–41)
Albumin: 4.2 g/dL (ref 3.5–5.0)
Alkaline Phosphatase: 64 U/L (ref 38–126)
Anion gap: 7 (ref 5–15)
BUN: 14 mg/dL (ref 8–23)
CO2: 26 mmol/L (ref 22–32)
Calcium: 9.6 mg/dL (ref 8.9–10.3)
Chloride: 105 mmol/L (ref 98–111)
Creatinine, Ser: 0.62 mg/dL (ref 0.44–1.00)
GFR, Estimated: >60 mL/min (ref >60)
Glucose, Bld: 109 mg/dL — ABNORMAL HIGH (ref 70–99)
Potassium: 4.2 mmol/L (ref 3.5–5.1)
Sodium: 138 mmol/L (ref 135–145)
Total Bilirubin: 0.2 mg/dL — ABNORMAL LOW (ref 0.3–1.2)
Total Protein: 6.6 g/dL (ref 6.5–8.1)

## 2021-06-06 LAB — MAGNESIUM: Magnesium: 2.1 mg/dL (ref 1.7–2.4)

## 2021-06-06 LAB — PHOSPHORUS: Phosphorus: 3.8 mg/dL (ref 2.5–4.6)

## 2021-06-06 MED ORDER — POTASSIUM CHLORIDE IN NACL 20-0.9 MEQ/L-% IV SOLN
INTRAVENOUS | Status: DC
  Filled 2021-06-06: qty 1000

## 2021-06-06 MED ORDER — ENOXAPARIN SODIUM 40 MG/0.4ML IJ SOSY
40.0000 mg | PREFILLED_SYRINGE | INTRAMUSCULAR | Status: DC
  Administered 2021-06-06 – 2021-06-10 (×5): 40 mg via SUBCUTANEOUS
  Filled 2021-06-06 (×5): qty 0.4

## 2021-06-06 MED ORDER — SODIUM CHLORIDE 0.9 % IV SOLN
500.0000 mg/m2 | Freq: Once | INTRAVENOUS | Status: AC
  Administered 2021-06-06: 900 mg via INTRAVENOUS
  Filled 2021-06-06: qty 90

## 2021-06-06 MED ORDER — ACETAMINOPHEN 325 MG PO TABS
650.0000 mg | ORAL_TABLET | Freq: Once | ORAL | Status: AC
  Administered 2021-06-06: 650 mg via ORAL
  Filled 2021-06-06: qty 2

## 2021-06-06 MED ORDER — DIPHENHYDRAMINE HCL 50 MG/ML IJ SOLN
25.0000 mg | Freq: Once | INTRAMUSCULAR | Status: AC
  Administered 2021-06-06: 25 mg via INTRAVENOUS
  Filled 2021-06-06: qty 1

## 2021-06-06 MED ORDER — SODIUM BICARBONATE 8.4 % IV SOLN
INTRAVENOUS | Status: DC
  Filled 2021-06-06: qty 1000
  Filled 2021-06-06 (×4): qty 150
  Filled 2021-06-06 (×2): qty 1000
  Filled 2021-06-06 (×4): qty 150
  Filled 2021-06-06: qty 1000

## 2021-06-06 NOTE — Progress Notes (Signed)
Patient received rituxan today. Vital signs stable through out treatment. Patient tolerated treatment well with no issues. Report given to staff nurse upon leaving.  ?

## 2021-06-06 NOTE — Progress Notes (Signed)
Patient successfully admitted for her next cycle of treatment. Will continue to follow for post discharge needs and office follow up.  ? ?Oncology Nurse Navigator Documentation ? ? ?  06/06/2021  ?  9:00 AM  ?Oncology Nurse Navigator Flowsheets  ?Navigator Follow Up Date: 06/13/2021  ?Navigator Follow Up Reason: Appointment Review  ?Navigator Location [facility]  ?Navigator Encounter Type Appt/Treatment Plan Review  ?Patient Visit Type MedOnc  ?Treatment Phase Active Tx  ?Barriers/Navigation Needs Coordination of Care;Education  ?Interventions None Required  ?Support Groups/Services Friends and Family  ?Time Spent with Patient 15  ?  ?

## 2021-06-06 NOTE — H&P (Signed)
?Hematology and Oncology Follow Up Visit ? ?Ariel [name] ?[id number] ?Jun 09, 1955 66 y.o. ?05/26/2021 ? ? ?Principle Diagnosis:  ?Diffuse large cell non-Hodgkin's lymphoma-CNS involvement ? ?Current Therapy:   ?Status post chemotherapy with R-ICE/R-MTV --  s/p cycle #4 ?MATRIX -- s/p cycle#1 -- start on 05/02/2021 ?Neulasta 6 mg subcu post chemotherapy ?    ?Interim History:  Ariel [name] is comes in for admission for her second cycle of MATRIX.  She was admitted after the last cycle of MATRIX because of pancytopenia.  We will make some dosage adjustments.  Patient is looking great.  She had a PET scan that was done that did not show any evidence of systemic disease. ? ?She had a wonderful Easter weekend.  Her daughter came up from [place].  She was here for the whole week.  Ariel [name] was able to go home.  She really enjoyed being home. ? ?She has had no problems with mouth sores.  She had no cough or shortness of breath.  There is no rashes.  She has had no joint swelling.  She does have some arthritis. ? ?There is no diarrhea. ? ?She has had no headache.  There is no blurred vision.  She has had no difficulty with her memory. ? ?She is being admitted now for second cycle of MATRIX.   ? ?Overall, I would say performance status is ECOG 0.   ? ?Medications:  ?Current Outpatient Medications:  ?  acetaminophen (TYLENOL) 500 MG tablet, Take 500-1,000 mg by mouth every 6 (six) hours as needed for mild pain or headache., Disp: , Rfl:  ?  acyclovir (ZOVIRAX) 200 MG capsule, Take 1 capsule (200 mg total) by mouth 2 (two) times daily., Disp: 60 capsule, Rfl: 3 ?  antiseptic oral rinse (BIOTENE) LIQD, 15 mLs by Mouth Rinse route every 4 (four) hours. (Patient taking differently: 15 mLs by Mouth Rinse route every 4 (four) hours as needed for dry mouth or mouth pain.), Disp: 500 mL, Rfl: 4 ?  docusate sodium (COLACE) 100 MG capsule, Take 1 capsule (100 mg total) by mouth 2 (two) times daily as needed for mild constipation. (Patient  taking differently: Take 100 mg by mouth at bedtime.), Disp: 10 capsule, Rfl: 0 ?  lidocaine-prilocaine (EMLA) cream, Apply to affected area once, Disp: 30 g, Rfl: 3 ?  ondansetron (ZOFRAN) 8 MG tablet, Take 8 mg by mouth every 8 (eight) hours as needed for nausea or vomiting., Disp: , Rfl:  ?  OVER THE COUNTER MEDICATION, Take 3 capsules by mouth See admin instructions. Balance of Nature VEGETABLE formulation capsules- Take 3 capsules by mouth every morning, Disp: , Rfl:  ?  OVER THE COUNTER MEDICATION, Take 3 capsules by mouth See admin instructions. Balance of Nature FRUIT formulation capsules- Take 3 capsules by mouth every morning, Disp: , Rfl:  ?  Sodium Chloride-Sodium Bicarb (SODIUM BICARBONATE/SODIUM CHLORIDE) SOLN, 30 application by Mouth Rinse route every 4 (four) hours. (Patient taking differently: 1 application. by Mouth Rinse route every 4 (four) hours as needed for mouth pain.), Disp: 1000 mL, Rfl: 3 ?  VUITY 1.25 % SOLN, Place 1 drop into both eyes 2 (two) times daily as needed (for dryness)., Disp: , Rfl:  ? ?Allergies:  ?Allergies  ?Allergen Reactions  ? Decadron [Dexamethasone] Other (See Comments)  ?  Made the patient's legs tired and she could not walk  ? Plavix [Clopidogrel] Hives, Swelling and Other (See Comments)  ?  Lips became swollen ?  ? Prednisone Other (See  Comments)  ?  Steroids caused psychological issues   ? Codeine Nausea Only  ? ? ?Past Medical History, Surgical history, Social history, and Family History were reviewed and updated. ? ?Review of Systems: ?Review of Systems  ?Constitutional: Negative.   ?HENT:  Negative.    ?Eyes: Negative.   ?Respiratory: Negative.    ?Cardiovascular: Negative.   ?Gastrointestinal: Negative.   ?Endocrine: Negative.   ?Genitourinary: Negative.    ?Musculoskeletal: Negative.   ?Skin: Negative.   ?Neurological: Negative.   ?Hematological: Negative.   ?Psychiatric/Behavioral: Negative.    ? ?Physical Exam: ? height is '5\' 7"'$  (1.702 m) and weight is 152  lb (68.9 kg). Her oral temperature is 98.2 ?F (36.8 ?C). Her blood pressure is 129/47 (abnormal) and her pulse is 73. Her respiration is 18 and oxygen saturation is 100%.  ? ?Wt Readings from Last 3 Encounters:  ?05/26/21 152 lb (68.9 kg)  ?05/16/21 149 lb (67.6 kg)  ?05/16/21 149 lb (67.6 kg)  ? ? ?Physical Exam ?Vitals reviewed.  ?HENT:  ?   Head: Normocephalic and atraumatic.  ?   Mouth/Throat:  ?   Comments: Oral exam shows no mucositis.  She has clean gingiva.   ?Eyes:  ?   Pupils: Pupils are equal, round, and reactive to light.  ?Cardiovascular:  ?   Rate and Rhythm: Normal rate and regular rhythm.  ?   Heart sounds: Normal heart sounds.  ?   Comments: Cardiac exam is regular rate and rhythm with no murmurs, rubs or bruits. ?Pulmonary:  ?   Effort: Pulmonary effort is normal.  ?   Breath sounds: Normal breath sounds.  ?Abdominal:  ?   General: Bowel sounds are normal.  ?   Palpations: Abdomen is soft.  ?Musculoskeletal:     ?   General: No tenderness or deformity. Normal range of motion.  ?   Cervical back: Normal range of motion.  ?Lymphadenopathy:  ?   Cervical: No cervical adenopathy.  ?Skin: ?   General: Skin is warm and dry.  ?   Findings: No erythema or rash.  ?Neurological:  ?   Mental Status: She is alert and oriented to person, place, and time.  ?   Comments: Neurological exam shows no focal neurological deficits.  All cranial nerves are intact.  She has good strength in upper and lower extremities.  ?Psychiatric:     ?   Behavior: Behavior normal.     ?   Thought Content: Thought content normal.     ?   Judgment: Judgment normal.  ? ? ? ?Lab Results  ?Component Value Date  ? WBC 5.3 05/26/2021  ? HGB 8.9 (L) 05/26/2021  ? HCT 27.1 (L) 05/26/2021  ? MCV 88.3 05/26/2021  ? PLT 344 05/26/2021  ? ?  Chemistry   ?   ?Component Value Date/Time  ? NA 138 05/26/2021 1318  ? K 3.9 05/26/2021 1318  ? CL 101 05/26/2021 1318  ? CO2 29 05/26/2021 1318  ? BUN 9 05/26/2021 1318  ? CREATININE 0.72 05/26/2021 1318  ?  CREATININE 0.68 11/18/2012 1439  ?    ?Component Value Date/Time  ? CALCIUM 9.7 05/26/2021 1318  ? ALKPHOS 86 05/26/2021 1318  ? AST 19 05/26/2021 1318  ? ALT 24 05/26/2021 1318  ? BILITOT 0.3 05/26/2021 1318  ?  ? ? ?Impression and Plan: ?Ms. Rosenstock is a very charming 66 year old white female.  She initially came to [place] with iron deficiency anemia.  We subsequently found  that she had diffuse large cell non-Hodgkin's lymphoma.  It had traveled to her brain. ? ?She has had 4 cycles of treatment.  We basically gave her R-ICE alternating with R-MTV.  She tolerated this very well. ? ?We gave her MATRIX protocol in the hospital about 3 weeks ago.  She now is being mated for her second cycle. ? ?Again, we will have to adjust the treatment doses, particular with the Ara-C. ? ? ?Make sure that she has a alkaline urine.  She typically gets rid of the methotrexate very quickly. ? ?I  know that pharmacy does an incredible job managing the methotrexate levels for [place].  Typically, Ms. Everly metabolizes the methotrexate in 3 days. ? ?Hopefully, we will be able to discharge her before or by the weekend. ? ?Would like to check her labs.  Her admission labs look okay. ?We have to make sure that her urine is alkaline. ? ?I know that she will get incredible care from all the staff upon [facility].  She really loves the [facility] staff in the compassion that they have for her.  I am not surprised. ? ? ?Volanda Napoleon, MD ?3/30/20231:56 PM  ?

## 2021-06-06 NOTE — Progress Notes (Signed)
Okay to discontinue NS with potassium chloride continuous infusion per Dr. Marin Olp. ?Patient is beginning bicarb infusion prior to methotrexate infusion tomorrow. ?

## 2021-06-07 DIAGNOSIS — C8331 Diffuse large B-cell lymphoma, lymph nodes of head, face, and neck: Secondary | ICD-10-CM

## 2021-06-07 LAB — URINALYSIS, ROUTINE W REFLEX MICROSCOPIC
Bilirubin Urine: NEGATIVE
Glucose, UA: NEGATIVE mg/dL
Hgb urine dipstick: NEGATIVE
Ketones, ur: NEGATIVE mg/dL
Leukocytes,Ua: NEGATIVE
Nitrite: NEGATIVE
Protein, ur: NEGATIVE mg/dL
Specific Gravity, Urine: 1.006 (ref 1.005–1.030)
pH: 9 — ABNORMAL HIGH (ref 5.0–8.0)

## 2021-06-07 LAB — CBC
HCT: 30.4 % — ABNORMAL LOW (ref 36.0–46.0)
Hemoglobin: 10.3 g/dL — ABNORMAL LOW (ref 12.0–15.0)
MCH: 31.9 pg (ref 26.0–34.0)
MCHC: 33.9 g/dL (ref 30.0–36.0)
MCV: 94.1 fL (ref 80.0–100.0)
Platelets: 208 10*3/uL (ref 150–400)
RBC: 3.23 MIL/uL — ABNORMAL LOW (ref 3.87–5.11)
RDW: 20.1 % — ABNORMAL HIGH (ref 11.5–15.5)
WBC: 3.3 10*3/uL — ABNORMAL LOW (ref 4.0–10.5)
nRBC: 0 % (ref 0.0–0.2)

## 2021-06-07 LAB — COMPREHENSIVE METABOLIC PANEL
ALT: 18 U/L (ref 0–44)
AST: 20 U/L (ref 15–41)
Albumin: 3.7 g/dL (ref 3.5–5.0)
Alkaline Phosphatase: 58 U/L (ref 38–126)
Anion gap: 6 (ref 5–15)
BUN: 10 mg/dL (ref 8–23)
CO2: 34 mmol/L — ABNORMAL HIGH (ref 22–32)
Calcium: 9.1 mg/dL (ref 8.9–10.3)
Chloride: 101 mmol/L (ref 98–111)
Creatinine, Ser: 0.49 mg/dL (ref 0.44–1.00)
GFR, Estimated: >60 mL/min (ref >60)
Glucose, Bld: 142 mg/dL — ABNORMAL HIGH (ref 70–99)
Potassium: 3.6 mmol/L (ref 3.5–5.1)
Sodium: 141 mmol/L (ref 135–145)
Total Bilirubin: 0.3 mg/dL (ref 0.3–1.2)
Total Protein: 6.2 g/dL — ABNORMAL LOW (ref 6.5–8.1)

## 2021-06-07 MED ORDER — METHOTREXATE CHEMO INJECTION (PF) 1 GM **50 MG/ML**
905.0000 mg | Freq: Once | INTRAVENOUS | Status: AC
  Administered 2021-06-07: 905 mg via INTRAVENOUS
  Filled 2021-06-07: qty 100

## 2021-06-07 MED ORDER — SODIUM CHLORIDE 0.9 % IV SOLN
Freq: Once | INTRAVENOUS | Status: AC
  Administered 2021-06-07: 16 mg via INTRAVENOUS
  Filled 2021-06-07: qty 8

## 2021-06-07 MED ORDER — CHLORHEXIDINE GLUCONATE CLOTH 2 % EX PADS
6.0000 | MEDICATED_PAD | Freq: Every day | CUTANEOUS | Status: DC
  Administered 2021-06-07 – 2021-06-11 (×5): 6 via TOPICAL

## 2021-06-07 MED ORDER — DEXAMETHASONE SODIUM PHOSPHATE 0.1 % OP SOLN
2.0000 [drp] | Freq: Three times a day (TID) | OPHTHALMIC | Status: DC
  Administered 2021-06-07 – 2021-06-08 (×4): 2 [drp] via OPHTHALMIC
  Filled 2021-06-07: qty 5

## 2021-06-07 MED ORDER — METHOTREXATE CHEMO INJECTION (PF) 1 GM **50 MG/ML**
5.4300 g | Freq: Once | INTRAVENOUS | Status: AC
  Administered 2021-06-07: 5.43 g via INTRAVENOUS
  Filled 2021-06-07: qty 500

## 2021-06-07 MED ORDER — BIOTENE DRY MOUTH MT LIQD
15.0000 mL | Freq: Four times a day (QID) | OROMUCOSAL | Status: DC
  Administered 2021-06-07 – 2021-06-09 (×9): 15 mL via OROMUCOSAL

## 2021-06-07 NOTE — Progress Notes (Signed)
She is doing well.  She had no problems last night.  She is ready for the methotrexate today.  We will have to see what the urine pH is.  She is getting sodium bicarb. ? ?She has had no problems with cough or shortness of breath.  She has had no nausea or vomiting.  She has had some arthritic issues. ? ?She has had no problems with rashes.  There is no change in bowel or bladder habits. ? ?She has had no headache. ? ?There is no labs back today. ? ?Her vital signs are temperature 97.7.  Pulse 75.  Blood pressure 113/65.  Her head neck exam shows no ocular or oral lesions.  She has no mucositis.  Lungs are clear.  Cardiac exam regular rate and rhythm.  Abdomen is soft.  Bowel sounds are present.  Extremity shows no clubbing, cyanosis or edema.  Neurological exam is nonfocal.  Skin exam shows no rashes. ? ?Again, she is ready for methotrexate.  Again, pharmacy does a fantastic job in monitoring methotrexate levels.  We will see what her urine pH is. ? ?Hopefully, the labs will be back this morning. ? ?I do appreciate the wonderful care she is getting from the incredible staff up on [facility]. ? ?[name], MD ? ?Isaiah 41:10 ? ? ?

## 2021-06-07 NOTE — Progress Notes (Signed)
Change methotrexate to total of 3.5 gm/m2 as was given in previous cycle per MATRix protocol per Dr.Ennever. ?

## 2021-06-07 NOTE — Progress Notes (Signed)
Urine pH = 9, okay to proceed with MTX per Dr. Marin Olp. ?

## 2021-06-08 ENCOUNTER — Encounter: Payer: Self-pay | Admitting: Hematology & Oncology

## 2021-06-08 DIAGNOSIS — C8330 Diffuse large B-cell lymphoma, unspecified site: Secondary | ICD-10-CM

## 2021-06-08 LAB — CBC
HCT: 30.1 % — ABNORMAL LOW (ref 36.0–46.0)
Hemoglobin: 10 g/dL — ABNORMAL LOW (ref 12.0–15.0)
MCH: 31 pg (ref 26.0–34.0)
MCHC: 33.2 g/dL (ref 30.0–36.0)
MCV: 93.2 fL (ref 80.0–100.0)
Platelets: 192 10*3/uL (ref 150–400)
RBC: 3.23 MIL/uL — ABNORMAL LOW (ref 3.87–5.11)
RDW: 19.3 % — ABNORMAL HIGH (ref 11.5–15.5)
WBC: 4.6 10*3/uL (ref 4.0–10.5)
nRBC: 0 % (ref 0.0–0.2)

## 2021-06-08 LAB — COMPREHENSIVE METABOLIC PANEL
ALT: 23 U/L (ref 0–44)
AST: 25 U/L (ref 15–41)
Albumin: 3.5 g/dL (ref 3.5–5.0)
Alkaline Phosphatase: 52 U/L (ref 38–126)
Anion gap: 5 (ref 5–15)
BUN: 11 mg/dL (ref 8–23)
CO2: 35 mmol/L — ABNORMAL HIGH (ref 22–32)
Calcium: 9.2 mg/dL (ref 8.9–10.3)
Chloride: 101 mmol/L (ref 98–111)
Creatinine, Ser: 0.63 mg/dL (ref 0.44–1.00)
GFR, Estimated: >60 mL/min (ref >60)
Glucose, Bld: 125 mg/dL — ABNORMAL HIGH (ref 70–99)
Potassium: 3.7 mmol/L (ref 3.5–5.1)
Sodium: 141 mmol/L (ref 135–145)
Total Bilirubin: 0.4 mg/dL (ref 0.3–1.2)
Total Protein: 5.9 g/dL — ABNORMAL LOW (ref 6.5–8.1)

## 2021-06-08 LAB — URINALYSIS, ROUTINE W REFLEX MICROSCOPIC
Bilirubin Urine: NEGATIVE
Glucose, UA: NEGATIVE mg/dL
Hgb urine dipstick: NEGATIVE
Ketones, ur: NEGATIVE mg/dL
Leukocytes,Ua: NEGATIVE
Nitrite: NEGATIVE
Protein, ur: NEGATIVE mg/dL
Specific Gravity, Urine: 1.008 (ref 1.005–1.030)
pH: 9 — ABNORMAL HIGH (ref 5.0–8.0)

## 2021-06-08 LAB — METHOTREXATE: Methotrexate: 1.16

## 2021-06-08 MED ORDER — ACYCLOVIR 200 MG PO CAPS
400.0000 mg | ORAL_CAPSULE | Freq: Two times a day (BID) | ORAL | Status: DC
  Administered 2021-06-08 – 2021-06-11 (×7): 400 mg via ORAL
  Filled 2021-06-08 (×7): qty 2

## 2021-06-08 MED ORDER — SODIUM CHLORIDE 0.9 % IV SOLN
Freq: Once | INTRAVENOUS | Status: AC
  Administered 2021-06-09: 8 mg via INTRAVENOUS
  Filled 2021-06-08: qty 8

## 2021-06-08 MED ORDER — SODIUM CHLORIDE 0.9 % IV SOLN
16.0000 mg | Freq: Once | INTRAVENOUS | Status: AC
  Administered 2021-06-08: 16 mg via INTRAVENOUS
  Filled 2021-06-08: qty 8

## 2021-06-08 MED ORDER — DEXAMETHASONE SODIUM PHOSPHATE 0.1 % OP SOLN
2.0000 [drp] | Freq: Four times a day (QID) | OPHTHALMIC | Status: DC
  Administered 2021-06-08 – 2021-06-10 (×9): 2 [drp] via OPHTHALMIC

## 2021-06-08 MED ORDER — SODIUM CHLORIDE 0.9 % IV SOLN
150.0000 mg | Freq: Once | INTRAVENOUS | Status: AC
  Administered 2021-06-08: 150 mg via INTRAVENOUS
  Filled 2021-06-08: qty 5

## 2021-06-08 MED ORDER — SODIUM CHLORIDE 0.9 % IV SOLN
1600.0000 mg/m2 | Freq: Two times a day (BID) | INTRAVENOUS | Status: AC
  Administered 2021-06-09 – 2021-06-10 (×2): 2896 mg via INTRAVENOUS
  Filled 2021-06-08 (×4): qty 144.8

## 2021-06-08 MED ORDER — SODIUM CHLORIDE 0.9 % IV SOLN
1600.0000 mg/m2 | Freq: Two times a day (BID) | INTRAVENOUS | Status: AC
  Administered 2021-06-08 – 2021-06-09 (×2): 2896 mg via INTRAVENOUS
  Filled 2021-06-08 (×3): qty 144.8

## 2021-06-08 MED ORDER — ORAL CARE MOUTH RINSE
15.0000 mL | Freq: Two times a day (BID) | OROMUCOSAL | Status: DC
  Administered 2021-06-08 – 2021-06-10 (×6): 15 mL via OROMUCOSAL

## 2021-06-08 MED ORDER — LEUCOVORIN CALCIUM INJECTION 100 MG
15.0000 mg/m2 | Freq: Four times a day (QID) | INTRAMUSCULAR | Status: AC
  Administered 2021-06-08 – 2021-06-09 (×4): 28 mg via INTRAVENOUS
  Filled 2021-06-08 (×6): qty 1.4

## 2021-06-08 NOTE — Progress Notes (Signed)
Chemotherapy dosage and calculations verified with Adline Peals, RN.  ?

## 2021-06-08 NOTE — Progress Notes (Signed)
So far, Ariel [name] is doing well with chemotherapy.  She had no problems with the methotrexate.  We will monitor her methotrexate level.  As always, pharmacy will do a incredible job in helping [place] with monitoring methotrexate level.  She will be on leucovorin rescue. ? ?She will start her Ara-C today.  We started her on Decadron eyedrops. ? ?She has had really no nausea.  Has been no diarrhea.  She has had no cough or shortness of breath.  There has been some slight visual changes with the right eye which is chronic.  She has had problems before.  She says she is going to see her ophthalmologist next week. ? ?Her CBC shows a white count of 4.6.  Hemoglobin 10.0.  Platelet count 192,000.  The BUN is 11 creatinine 0.63.  Her SGPT is 23 SGOT 25.  I expect that these will go up with the methotrexate. ? ?Her vital signs are all stable.  There is no change in her overall physical exam. ? ?We will proceed with the Ara-C today.  She gets a dose today and tomorrow. ? ?Hopefully, we can still plan to let her go on Friday or Saturday. ? ?I know appreciate the incredible care that she has gotten from the [facility] staff. ? ?[name], MD ? ?Fara Olden 2:32 ?

## 2021-06-08 NOTE — TOC Initial Note (Signed)
Transition of Care (TOC) - Initial/Assessment Note  ? ? ?Patient Details  ?Name: Ariel [name] ?MRN: [id number] ?Date of Birth: 17-Feb-1956 ? ?Transition of Care (TOC) CM/SW Contact:    ?Random Dobrowski, Marjie Skiff, RN ?Phone Number: ?06/08/2021, 11:21 AM ? ?Clinical Narrative:                 ?Pt is from [place] at [facility] and will return there at dc. ? ?Expected Discharge Plan: Home/Self Care ?Barriers to Discharge: Continued Medical Work up ? ? ?Patient Goals and CMS Choice ?Patient states their goals for this hospitalization and ongoing recovery are:: To go home ?  ?  ? ?Expected Discharge Plan and Services ?Expected Discharge Plan: Home/Self Care ?  ?Discharge Planning Services: CM Consult ?  ?Living arrangements for the past 2 months: [place] ?                ?  ?   ? ?Prior Living Arrangements/Services ?Living arrangements for the past 2 months: [place] ?Lives with:: Self ?Patient language and need for interpreter reviewed:: Yes ?       ?Need for Family Participation in Patient Care: Yes (Comment) ?Care giver support system in place?: Yes (comment) ?  ?Criminal Activity/Legal Involvement Pertinent to Current Situation/Hospitalization: No - Comment as needed ? ?Activities of Daily Living ?Home Assistive Devices/Equipment: None ?ADL Screening (condition at time of admission) ?Patient's cognitive ability adequate to safely complete daily activities?: Yes ?Is the patient deaf or have difficulty hearing?: No ?Does the patient have difficulty seeing, even when wearing glasses/contacts?: No ?Does the patient have difficulty concentrating, remembering, or making decisions?: No ?Patient able to express need for assistance with ADLs?: Yes ?Does the patient have difficulty dressing or bathing?: No ?Independently performs ADLs?: Yes (appropriate for developmental age) ?Does the patient have difficulty walking or climbing stairs?: No ?Weakness of Legs: None ?Weakness of  Arms/Hands: None ? ?Permission Sought/Granted ?  ?  ?   ?   ?   ?   ? ?Emotional Assessment ?Appearance:: Appears stated age ?Attitude/Demeanor/Rapport: Gracious ?Affect (typically observed): Calm ?Orientation: : Oriented to Self, Oriented to Place, Oriented to  Time, Oriented to Situation ?Alcohol / Substance Use: Not Applicable ?Psych Involvement: No (comment) ? ?Admission diagnosis:  Diffuse large B cell lymphoma ([place]) [C83.30] ?Patient Active Problem List  ? Diagnosis Date Noted  ? Pancytopenia ([place]) 05/16/2021  ? 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 Diffuse large B cell lymphoma ([place]) 03/07/2021  ? Encounter for antineoplastic chemotherapy 03/07/2021  ? AMS (altered mental status) 01/14/2021  ? Neutropenia ([place]) 01/14/2021  ? High grade B-cell lymphoma ([place]) 12/22/2020  ? Goals of care, counseling/discussion 12/22/2020  ? Acute metabolic encephalopathy 31/49/7026  ? Brain metastasis 12/09/2020  ? Brain tumor ([place]) 12/08/2020  ? Hyponatremia 12/08/2020  ? Night sweat 11/30/2020  ? Chicken pox 11/22/2020  ? Complication of anesthesia 11/22/2020  ? PONV (postoperative nausea and vomiting) 11/22/2020  ? Skin rash 11/10/2020  ? Tachycardia 11/10/2020  ? Anemia 11/10/2020  ? Hyperglycemia 11/10/2020  ? TIA (transient ischemic attack) 10/07/2020  ? Iron deficiency anemia 10/07/2020  ? Aphasia 10/06/2020  ? Melanoma ([place]) 2021  ? Multinodular goiter 12/22/2013  ? Subclinical hyperthyroidism 12/19/2013  ? Urinary incontinence 11/19/2013  ? Hyperlipidemia 11/19/2012  ? Routine general medical examination at a health care facility 11/18/2012  ? ?PCP:  [name], NP ?Pharmacy:   ?[place]  [place] [address] ?Phone: [phone] Fax: [phone] ? ? ? ? ?Social Determinants of Health (SDOH) Interventions ?  ? ?Readmission Risk Interventions ? ?  06/08/2021  ? 11:18 AM 05/18/2021  ?  1:32 PM 05/02/2021  ?   2:25 PM  ?Readmission Risk Prevention Plan  ?Transportation Screening Complete Complete Complete  ?PCP or Specialist Appt within 3-5 Days  Complete Complete  ?[place] or Home Care Consult  Complete Complete  ?Social Work Consult for [place] Planning/Counseling  Complete Complete  ?Palliative Care Screening  Not Applicable Not Applicable  ?Medication Review Press photographer) Complete Complete Complete  ?PCP or Specialist appointment within 3-5 days of discharge Complete    ?[place] or Home Care Consult Complete    ?SW Recovery Care/Counseling Consult Complete    ?Palliative Care Screening Not Applicable    ?[place] Not Applicable    ? ? ? ?

## 2021-06-08 NOTE — Progress Notes (Signed)
Cytarabine was not hung until 11:50am. ?No chemo RN available to give next dose 10-12 hours after first dose. ?Give next dose at 8am tomorrow morning per Dr. Marin Olp. ?

## 2021-06-09 DIAGNOSIS — R110 Nausea: Secondary | ICD-10-CM

## 2021-06-09 LAB — COMPREHENSIVE METABOLIC PANEL
ALT: 49 U/L — ABNORMAL HIGH (ref 0–44)
AST: 48 U/L — ABNORMAL HIGH (ref 15–41)
Albumin: 3.3 g/dL — ABNORMAL LOW (ref 3.5–5.0)
Alkaline Phosphatase: 54 U/L (ref 38–126)
Anion gap: 7 (ref 5–15)
BUN: 10 mg/dL (ref 8–23)
CO2: 33 mmol/L — ABNORMAL HIGH (ref 22–32)
Calcium: 9 mg/dL (ref 8.9–10.3)
Chloride: 102 mmol/L (ref 98–111)
Creatinine, Ser: 0.39 mg/dL — ABNORMAL LOW (ref 0.44–1.00)
GFR, Estimated: >60 mL/min (ref >60)
Glucose, Bld: 122 mg/dL — ABNORMAL HIGH (ref 70–99)
Potassium: 3.6 mmol/L (ref 3.5–5.1)
Sodium: 142 mmol/L (ref 135–145)
Total Bilirubin: 0.3 mg/dL (ref 0.3–1.2)
Total Protein: 5.6 g/dL — ABNORMAL LOW (ref 6.5–8.1)

## 2021-06-09 LAB — CBC
HCT: 28.7 % — ABNORMAL LOW (ref 36.0–46.0)
Hemoglobin: 9.5 g/dL — ABNORMAL LOW (ref 12.0–15.0)
MCH: 31 pg (ref 26.0–34.0)
MCHC: 33.1 g/dL (ref 30.0–36.0)
MCV: 93.8 fL (ref 80.0–100.0)
Platelets: 160 10*3/uL (ref 150–400)
RBC: 3.06 MIL/uL — ABNORMAL LOW (ref 3.87–5.11)
RDW: 19.1 % — ABNORMAL HIGH (ref 11.5–15.5)
WBC: 4.3 10*3/uL (ref 4.0–10.5)
nRBC: 0 % (ref 0.0–0.2)

## 2021-06-09 LAB — URINALYSIS, ROUTINE W REFLEX MICROSCOPIC
Bilirubin Urine: NEGATIVE
Glucose, UA: NEGATIVE mg/dL
Hgb urine dipstick: NEGATIVE
Ketones, ur: NEGATIVE mg/dL
Leukocytes,Ua: NEGATIVE
Nitrite: NEGATIVE
Protein, ur: NEGATIVE mg/dL
Specific Gravity, Urine: 1.008 (ref 1.005–1.030)
pH: 9 — ABNORMAL HIGH (ref 5.0–8.0)

## 2021-06-09 LAB — METHOTREXATE: Methotrexate: 0.22

## 2021-06-09 MED ORDER — ACETAMINOPHEN 325 MG PO TABS
650.0000 mg | ORAL_TABLET | ORAL | Status: DC | PRN
  Administered 2021-06-09 – 2021-06-10 (×3): 650 mg via ORAL
  Filled 2021-06-09 (×3): qty 2

## 2021-06-09 MED ORDER — LEUCOVORIN CALCIUM INJECTION 100 MG
15.0000 mg/m2 | Freq: Four times a day (QID) | INTRAMUSCULAR | Status: AC
  Administered 2021-06-09 – 2021-06-10 (×4): 28 mg via INTRAVENOUS
  Filled 2021-06-09 (×5): qty 1.4

## 2021-06-09 MED ORDER — PROCHLORPERAZINE EDISYLATE 10 MG/2ML IJ SOLN
10.0000 mg | Freq: Four times a day (QID) | INTRAMUSCULAR | Status: DC | PRN
  Administered 2021-06-09: 10 mg via INTRAVENOUS
  Filled 2021-06-09: qty 2

## 2021-06-09 NOTE — Care Management Important Message (Signed)
Important Message ? ?Patient Details IM Letter given to the Patient. ?Name: Ariel [name] ?MRN: [id number] ?Date of Birth: 03-02-1955 ? ? ?Medicare Important Message Given:  Yes ? ? ? ? ?[name] ?06/09/2021, 9:08 AM ?

## 2021-06-09 NOTE — Consult Note (Signed)
? ?  [facility] ? ? ?06/09/2021 ? ?Brand Males ?1955-12-16 ?[id number] ? ?[facility] [ACO] Patient: Ariel [name] ? ?[name] [facility]  coverage ? ?Primary Care Provider:  [name], NP, [place]  at [facility], is an embedded provider with a Chronic Care Management team and program, and is listed for the transition of care follow up and appointments. ? ?Patient was screened for extreme high risk score for unplanned readmission risk for a less than 30 day readmission hospitalization noted.  Also, to assess for Embedded practice service needs for chronic care management for post hospital follow up. ? ?Plan: Continue to follow for Embedded Care Management for post hospital needs, as appropriate. ? ?Please contact for further questions, ? ?Natividad Brood, RN BSN CCM ?[facility] Liaison ? [phone] business mobile phone ?Toll free office [phone]  ?Fax number: [phone] ?[email or web address] ? ? ? ?

## 2021-06-09 NOTE — Progress Notes (Signed)
So far, Ariel [name] is doing well.  Her methotrexate level came back yesterday at 1.7.  She is on leucovorin rescue..  She gets high-dose Ara-C today.  I think she got her first dose yet last night. ? ?She does have some nausea.  She has been getting antiemetics. ? ?She has had no headache.  There is no visual changes.  She has had no diarrhea.  She is sleeping okay. ? ?Her labs show white count of 4.3.  Hemoglobin 9.5.  Platelet count is 160,000.  Her LFTs are little bit elevated.  This always happens when she gets treated with methotrexate.  Her BUN is 10 creatinine 0.4. ? ?She has had no rashes.  There is been no complaints of joint pain or swelling. ? ?Currently, I would say that her performance status is ECOG 1. ? ? ?Her vital signs show temperature 97.9.  Pulse 87.  Blood pressure 99/60.  Her head neck exam shows no mucositis.  There is no adenopathy in the neck.  Lungs are clear bilaterally.  Cardiac exam regular rate and rhythm.  Abdomen is soft.  Bowel sounds are present.  There is no guarding or rebound tenderness.  Extremity shows no clubbing, cyanosis or edema.  Neurological exam shows no focal deficits. ? ?Ariel [name] will get her high-dose Ara-C today. ? ?We will have to see what her methotrexate levels are.  Once again, pharmacy does a fantastic job in managing this for me. ? ?We will continue to follow her labs.  I am not surprised by the increased LFTs.  This happens all the time when she gets the high-dose methotrexate. ? ?Again I appreciate the incredible care that she is getting from the wonderful staff up on [address]. ? ?[name], MD ? ?Psalm 27:1 ?

## 2021-06-09 NOTE — Progress Notes (Signed)
Changing patient's thiotepa to 4/14 at 6pm per Dr. Antonieta Pert instructions. ?Patient will receive cytarabine in the morning and thiotepa in the evening. ?Patient has received this schedule in the past and tolerated it well. ?

## 2021-06-09 NOTE — Progress Notes (Signed)
Pharmacy Brief Note - Chemotherapy Monitoring:  ? ?Patient received MTX on 06/07/21.  ? ?MTX level obtained this morning = 0.22 ? ?Lab result called to Dr. Marin Olp, verbal order to continue current dose of leucovorin 15 mg/m2 (28 mg) IV q6h x 4 doses. Next MTX level tomorrow morning.  ? ?Lenis Noon, PharmD ?06/09/21 ?1:47 PM ?

## 2021-06-10 ENCOUNTER — Encounter: Payer: Self-pay | Admitting: Hematology & Oncology

## 2021-06-10 ENCOUNTER — Encounter (HOSPITAL_COMMUNITY): Payer: Self-pay | Admitting: Hematology & Oncology

## 2021-06-10 LAB — COMPREHENSIVE METABOLIC PANEL
ALT: 130 U/L — ABNORMAL HIGH (ref 0–44)
AST: 110 U/L — ABNORMAL HIGH (ref 15–41)
Albumin: 3.1 g/dL — ABNORMAL LOW (ref 3.5–5.0)
Alkaline Phosphatase: 56 U/L (ref 38–126)
Anion gap: 6 (ref 5–15)
BUN: 15 mg/dL (ref 8–23)
CO2: 29 mmol/L (ref 22–32)
Calcium: 8.5 mg/dL — ABNORMAL LOW (ref 8.9–10.3)
Chloride: 98 mmol/L (ref 98–111)
Creatinine, Ser: 0.73 mg/dL (ref 0.44–1.00)
GFR, Estimated: >60 mL/min (ref >60)
Glucose, Bld: 223 mg/dL — ABNORMAL HIGH (ref 70–99)
Potassium: 3.3 mmol/L — ABNORMAL LOW (ref 3.5–5.1)
Sodium: 133 mmol/L — ABNORMAL LOW (ref 135–145)
Total Bilirubin: 0.8 mg/dL (ref 0.3–1.2)
Total Protein: 5.4 g/dL — ABNORMAL LOW (ref 6.5–8.1)

## 2021-06-10 LAB — PREPARE RBC (CROSSMATCH)

## 2021-06-10 LAB — CBC
HCT: 25 % — ABNORMAL LOW (ref 36.0–46.0)
Hemoglobin: 8.3 g/dL — ABNORMAL LOW (ref 12.0–15.0)
MCH: 30.9 pg (ref 26.0–34.0)
MCHC: 33.2 g/dL (ref 30.0–36.0)
MCV: 92.9 fL (ref 80.0–100.0)
Platelets: 133 10*3/uL — ABNORMAL LOW (ref 150–400)
RBC: 2.69 MIL/uL — ABNORMAL LOW (ref 3.87–5.11)
RDW: 18.7 % — ABNORMAL HIGH (ref 11.5–15.5)
WBC: 9.8 10*3/uL (ref 4.0–10.5)
nRBC: 0 % (ref 0.0–0.2)

## 2021-06-10 LAB — URINALYSIS, ROUTINE W REFLEX MICROSCOPIC
Bilirubin Urine: NEGATIVE
Glucose, UA: NEGATIVE mg/dL
Hgb urine dipstick: NEGATIVE
Ketones, ur: NEGATIVE mg/dL
Leukocytes,Ua: NEGATIVE
Nitrite: NEGATIVE
Protein, ur: 30 mg/dL — AB
Specific Gravity, Urine: 1.014 (ref 1.005–1.030)
pH: 9 — ABNORMAL HIGH (ref 5.0–8.0)

## 2021-06-10 LAB — METHOTREXATE: Methotrexate: 0.08

## 2021-06-10 MED ORDER — FUROSEMIDE 10 MG/ML IJ SOLN
20.0000 mg | Freq: Once | INTRAMUSCULAR | Status: AC
  Administered 2021-06-10: 20 mg via INTRAVENOUS

## 2021-06-10 MED ORDER — SODIUM CHLORIDE 0.9% IV SOLUTION: Freq: Once | INTRAVENOUS | Status: AC

## 2021-06-10 MED ORDER — THIOTEPA CHEMO INJECTION 15 MG
54.0000 mg | Freq: Once | INTRAVENOUS | Status: DC
  Filled 2021-06-10: qty 5.2

## 2021-06-10 MED ORDER — THIOTEPA CHEMO INJECTION 15 MG
54.0000 mg | Freq: Once | INTRAVENOUS | Status: AC
  Administered 2021-06-10: 54 mg via INTRAVENOUS
  Filled 2021-06-10: qty 5.19
  Filled 2021-06-10: qty 5.2

## 2021-06-10 MED ORDER — FUROSEMIDE 10 MG/ML IJ SOLN
20.0000 mg | Freq: Once | INTRAMUSCULAR | Status: DC
  Filled 2021-06-10 (×2): qty 2

## 2021-06-10 MED ORDER — SODIUM CHLORIDE 0.9 % IV SOLN
Freq: Once | INTRAVENOUS | Status: AC
  Administered 2021-06-10: 8 mg via INTRAVENOUS
  Filled 2021-06-10: qty 4

## 2021-06-10 MED ORDER — OLANZAPINE 5 MG PO TABS
10.0000 mg | ORAL_TABLET | Freq: Every day | ORAL | Status: DC
  Administered 2021-06-10 – 2021-06-11 (×2): 10 mg via ORAL
  Filled 2021-06-10 (×2): qty 2

## 2021-06-10 NOTE — Progress Notes (Signed)
RN note. Per NT, Systolic BP in left arm 00/51, BP right arm 108/53, reassessed BP left arm manually 105/50. Pt. Reports blood pressure running lower than usual and today she has felt "blah". Reports she has been very sleepy and fatigued today. Pt with lasix 20 mg ordered, notified Keyer, NP, advised per assessment and per Sander Nephew, NP hold lasix. Pt with no distress at this time. Instructed pt to inform me of any chest pain or shortness of breath. Pt verbalized understanding. Will continue to monitor pt closely, no distress noted. Willene Hatchet, RN  ?

## 2021-06-10 NOTE — Progress Notes (Signed)
Post 15 minute vitals from blood show increase in temperature from 98.3 to 99.6. Spoke with Erline Levine RN, Dr. Antonieta Pert nurse today. Per MD okay to continue.  ?

## 2021-06-10 NOTE — Progress Notes (Signed)
Ariel [name] is doing okay.  She had some nausea yesterday.  I will give her some Zyprexa to see if this may help. ? ?Her LFTs are elevated because of the methotrexate.  Her last methotrexate level was 0.22.  As always, she eliminates his very quickly. ? ?Her hemoglobin was down to 8.3.  We are going to have to transfuse her.  I think her hemoglobin will keep trending downward from treatment. ? ?I think she has 1 more chemotherapy to give it.  I think she gets thiotepa today. ? ?She is out of bed.  She is having no diarrhea.  She is having no headache.  There is no mouth sores.  She has had no cough or shortness of breath.  Her arthritis is not a problem. ? ?Her white cell count is 9.8.  Hemoglobin 8.3.  Platelet count is 133,000.  Her blood sugar is 223.  BUN is 15 creatinine 0.73.  Her SGPT is 130 SGOT 110. ? ?Her vital signs show temperature of 98.8.  Pulse 105.  Blood pressure 119/49.  Her head neck exam shows no ocular or oral lesions.  She has no adenopathy in the neck.  Lungs are clear.  Cardiac exam regular rate and rhythm.  Abdomen is soft.  Bowel sounds are present.  There is no guarding or rebound tenderness.  There is no palpable liver or spleen tip.  Extremity shows no clubbing, cyanosis or edema.  Neurological exam is nonfocal.  Skin exam shows no rashes or ecchymoses. ? ?I think Ariel [name] will finish up chemotherapy today.  She will get 2 units of blood.  If all looks good, then we will see about trying to get her home on Saturday.  I think this would be reasonable. ? ?We will have to follow-up with her on Monday in the office and give her Neulasta. ? ?[name], MD ? ?Romans 8:28 ?

## 2021-06-10 NOTE — Progress Notes (Signed)
Pharmacy Brief Note - Chemotherapy Monitoring:  ? ?Patient received MTX on 06/07/21.  ? ?MTX level obtained this morning = 0.08 ? ?Lab result called to Dr. Marin Olp, verbal order to stop leucovorin rescue and to d/c daily MTX level.  ? ?Lenis Noon, PharmD ?06/10/21 ?1:54 PM ?

## 2021-06-11 DIAGNOSIS — D509 Iron deficiency anemia, unspecified: Secondary | ICD-10-CM

## 2021-06-11 DIAGNOSIS — Z79899 Other long term (current) drug therapy: Secondary | ICD-10-CM

## 2021-06-11 LAB — COMPREHENSIVE METABOLIC PANEL
ALT: 124 U/L — ABNORMAL HIGH (ref 0–44)
AST: 96 U/L — ABNORMAL HIGH (ref 15–41)
Albumin: 3 g/dL — ABNORMAL LOW (ref 3.5–5.0)
Alkaline Phosphatase: 49 U/L (ref 38–126)
Anion gap: 7 (ref 5–15)
BUN: 14 mg/dL (ref 8–23)
CO2: 34 mmol/L — ABNORMAL HIGH (ref 22–32)
Calcium: 8.6 mg/dL — ABNORMAL LOW (ref 8.9–10.3)
Chloride: 98 mmol/L (ref 98–111)
Creatinine, Ser: 0.76 mg/dL (ref 0.44–1.00)
GFR, Estimated: >60 mL/min (ref >60)
Glucose, Bld: 169 mg/dL — ABNORMAL HIGH (ref 70–99)
Potassium: 3 mmol/L — ABNORMAL LOW (ref 3.5–5.1)
Sodium: 139 mmol/L (ref 135–145)
Total Bilirubin: 0.5 mg/dL (ref 0.3–1.2)
Total Protein: 5.3 g/dL — ABNORMAL LOW (ref 6.5–8.1)

## 2021-06-11 LAB — CBC
HCT: 29.6 % — ABNORMAL LOW (ref 36.0–46.0)
Hemoglobin: 10.1 g/dL — ABNORMAL LOW (ref 12.0–15.0)
MCH: 31 pg (ref 26.0–34.0)
MCHC: 34.1 g/dL (ref 30.0–36.0)
MCV: 90.8 fL (ref 80.0–100.0)
Platelets: UNDETERMINED 10*3/uL (ref 150–400)
RBC: 3.26 MIL/uL — ABNORMAL LOW (ref 3.87–5.11)
RDW: 18.5 % — ABNORMAL HIGH (ref 11.5–15.5)
WBC: 6.7 10*3/uL (ref 4.0–10.5)
nRBC: 0 % (ref 0.0–0.2)

## 2021-06-11 MED ORDER — FLUCONAZOLE 100 MG PO TABS: 100.0000 mg | ORAL_TABLET | Freq: Every day | ORAL | 4 refills | Status: DC

## 2021-06-11 MED ORDER — HEPARIN SOD (PORK) LOCK FLUSH 100 UNIT/ML IV SOLN: 500.0000 [IU] | INTRAVENOUS | Status: DC | PRN

## 2021-06-11 MED ORDER — CIPROFLOXACIN HCL 500 MG PO TABS: 500.0000 mg | ORAL_TABLET | Freq: Every day | ORAL | 4 refills | Status: DC

## 2021-06-11 MED ORDER — FLUCONAZOLE 100 MG PO TABS
100.0000 mg | ORAL_TABLET | Freq: Every day | ORAL | Status: DC
  Administered 2021-06-11: 100 mg via ORAL
  Filled 2021-06-11: qty 1

## 2021-06-11 MED ORDER — CIPROFLOXACIN HCL 500 MG PO TABS
500.0000 mg | ORAL_TABLET | Freq: Every day | ORAL | Status: DC
  Administered 2021-06-11: 500 mg via ORAL
  Filled 2021-06-11: qty 1

## 2021-06-11 MED ORDER — OLANZAPINE 5 MG PO TABS: 5.0000 mg | ORAL_TABLET | Freq: Every day | ORAL | 4 refills | Status: DC

## 2021-06-11 MED ORDER — HEPARIN SOD (PORK) LOCK FLUSH 100 UNIT/ML IV SOLN
500.0000 [IU] | INTRAVENOUS | Status: DC
  Administered 2021-06-11: 500 [IU]

## 2021-06-11 MED ORDER — ONDANSETRON HCL 8 MG PO TABS: 8.0000 mg | ORAL_TABLET | Freq: Three times a day (TID) | ORAL | Status: DC | PRN

## 2021-06-11 MED ORDER — ONDANSETRON HCL 8 MG PO TABS: 8.0000 mg | ORAL_TABLET | Freq: Three times a day (TID) | ORAL | 3 refills | Status: DC | PRN

## 2021-06-11 NOTE — Progress Notes (Signed)
Labs drawn per MD order and per protocol. Tubed to lab. Neomia Dear, RN ?

## 2021-06-11 NOTE — Discharge Summary (Signed)
Physician Discharge Summary  ?Patient ID: ?Ariel [name] ?'[email or web address]'$ @ ?MRN: [id number] ?DOB/AGE: [birth year] 66 y.o. ? ?Admit date: 06/06/2021 ?Discharge date: 06/11/2021 ? ?Admission Diagnoses: ?Diffuse large B cell lymphoma --cycle #2 of MATRIX ? ?Discharge Diagnoses:  ?Principal Problem: ?  Diffuse large B cell lymphoma ([place]) ? ? ?Discharged Condition: good ? ?Discharge Labs:  ? ?Significant Diagnostic Studies: None ? ?Consults: None ? ?Disposition: Discharge disposition: 01-Home or Self Care ? ? ? ? ? ? ?Treatments: therapies: Chemotherapy; blood transfusion ? ?Allergies as of 06/11/2021   ? ?   Reactions  ? Decadron [dexamethasone] Other (See Comments)  ? Made the patient's legs tired and she could not walk  ? Plavix [clopidogrel] Hives, Swelling, Other (See Comments)  ? Lips became swollen  ? Prednisone Other (See Comments)  ? Steroids caused psychological issues   ? Codeine Nausea Only  ? ?  ? ?  ?Medication List  ?  ? ?TAKE these medications   ? ?acetaminophen 500 MG tablet ?Commonly known as: TYLENOL ?Take 500-1,000 mg by mouth every 6 (six) hours as needed for mild pain or headache. ?  ?acyclovir 200 MG capsule ?Commonly known as: ZOVIRAX ?Take 1 capsule (200 mg total) by mouth 2 (two) times daily. ?What changed: additional instructions ?  ?antiseptic oral rinse Liqd ?15 mLs by Mouth Rinse route every 4 (four) hours. ?What changed:  ?when to take this ?reasons to take this ?  ?ciprofloxacin 500 MG tablet ?Commonly known as: CIPRO ?Take 1 tablet (500 mg total) by mouth daily with breakfast. ?  ?docusate sodium 100 MG capsule ?Commonly known as: COLACE ?Take 1 capsule (100 mg total) by mouth 2 (two) times daily as needed for mild constipation. ?What changed: when to take this ?  ?fluconazole 100 MG tablet ?Commonly known as: DIFLUCAN ?Take 1 tablet (100 mg total) by mouth daily. ?  ?OLANZapine 5 MG tablet ?Commonly known as: ZYPREXA ?Take 1 tablet (5 mg total) by mouth daily. ?  ?ondansetron 8 MG  tablet ?Commonly known as: ZOFRAN ?Take 1 tablet (8 mg total) by mouth every 8 (eight) hours as needed for nausea or vomiting. ?  ?OVER THE COUNTER MEDICATION ?Take 3 capsules by mouth See admin instructions. Balance of Nature VEGETABLE formulation capsules- Take 3 capsules by mouth every morning ?  ?OVER THE COUNTER MEDICATION ?Take 3 capsules by mouth See admin instructions. Balance of Nature FRUIT formulation capsules- Take 3 capsules by mouth every morning ?  ?Vuity 1.25 % Soln ?Generic drug: Pilocarpine HCl ?Place 1 drop into both eyes in the morning and at bedtime. ?  ? ?  ? ? ? Follow-up Information   ? ? Volanda Napoleon, MD Follow up in 2 day(s).   ?Specialty: Oncology ?Why: We will call you Monday morning when to come to the office for labs and Neulasta injection ?Contact information: ?[address] ?[address] ?[place] [address] ?[phone] ? ? ?  ?  ? ?  ?  ? ?  ? ? ?Hospital Course:  ?Ariel [name] was admitted on 06/06/2021.  She admitted for her second cycle of chemotherapy with MATRIX.  She had her Port-A-Cath accessed.  She had routine mission labs which looked okay.  We started her on treatment on the 10th. ? ?She did have alkalinization of her urine for the methotrexate.  She received high-dose methotrexate on the 11th.  She did well with this.  As always, she metabolized the methotrexate quickly.  Her methotrexate level went down to normal  in 2 days.  She did get leucovorin rescue which helped. ? ?She then received high-dose Ara-C.  She did well with this.  We gave her Decadron eyedrops. ? ?She did have some nausea after the chemotherapy.  We gave her antiemetics.  We gave her some Zyprexa which seemed to help. ? ?She ate well.  She had no diarrhea.  She had little bit of constipation. ? ?We did have to give her a blood transfusion on the 14th.  Her hemoglobin was 8.3.  She got 2 units of blood which helped. ? ?Everything looked good on the morning of the 15th.  She was ready to go home.   She felt as if she would be able to go home. ? ?We started her on her prophylactic antibiotics with acyclovir/ciprofloxacin/Diflucan. ? ?Her labs when she was discharged show sodium 139.  Potassium 3.0.  BUN is 14 creatinine 0.76.  Blood sugar is up a little bit at 169.  Her liver function studies are always elevated.  Her SGPT was 124 SGOT 96.  Her white cell count was 6.7.  Hemoglobin 10.1.  Platelet count was not available. ? ? ? ?Discharge Exam: ?Blood pressure (!) 109/55, pulse 84, temperature 98.7 ?F (37.1 ?C), temperature source Oral, resp. rate 17, height '5\' 7"'$  (1.702 m), weight 160 lb 0.9 oz (72.6 kg), SpO2 95 %. ?Or physical exam, her head and neck exam showed no ocular or oral lesions.  She has no palpable cervical or supraclavicular lymph nodes.  Lungs are clear bilaterally.  Cardiac exam regular rate and rhythm.  Abdomen is soft.  Bowel sounds are present.  There is no fluid wave.  There is no palpable liver or spleen tip.  Back exam shows no tenderness over the spine, ribs or hips.  Extremity shows no clubbing, cyanosis or edema.  She has good range of motion of her joints.  She has no joint swelling.  Neurological exam shows no focal neurological deficits.  Skin exam shows no rashes, ecchymosis or petechia. ? ? ? ?Signed: ?Volanda Napoleon ?06/11/2021, 7:32 AM  ?

## 2021-06-11 NOTE — Progress Notes (Signed)
Pt discharged home in stable condition. Discharge instructions given. Scripts sent to pharmacy of choice. No immediate questions or concerns at this time.  ?

## 2021-06-11 NOTE — Progress Notes (Signed)
RN Note: During hourly rounding, pt reported having a minimal nose bleed for a few seconds. States it was not much blood but she wanted to report it. Advised if any additional nose bleeds to please report. No distress noted. Will continue to monitor pt closely. Orest Dikes, RN ?

## 2021-06-11 NOTE — Progress Notes (Signed)
Urine sample collected and sent to lab. Neomia Dear, RN ?

## 2021-06-12 LAB — BPAM RBC
Blood Product Expiration Date: 202305042359
Blood Product Expiration Date: 202305042359
ISSUE DATE / TIME: 202304141251
ISSUE DATE / TIME: 202304141618
Unit Type and Rh: 6200
Unit Type and Rh: 6200

## 2021-06-12 LAB — TYPE AND SCREEN
ABO/RH(D): A POS
Antibody Screen: NEGATIVE
Unit division: 0
Unit division: 0

## 2021-06-13 ENCOUNTER — Inpatient Hospital Stay: Payer: Medicare Other

## 2021-06-13 ENCOUNTER — Inpatient Hospital Stay: Payer: Medicare Other | Attending: Internal Medicine

## 2021-06-13 VITALS — BP 135/57 | HR 64 | Temp 98.0°F | Resp 18

## 2021-06-13 DIAGNOSIS — Z79899 Other long term (current) drug therapy: Secondary | ICD-10-CM | POA: Diagnosis not present

## 2021-06-13 DIAGNOSIS — Z9221 Personal history of antineoplastic chemotherapy: Secondary | ICD-10-CM | POA: Diagnosis not present

## 2021-06-13 DIAGNOSIS — C8330 Diffuse large B-cell lymphoma, unspecified site: Secondary | ICD-10-CM | POA: Diagnosis not present

## 2021-06-13 DIAGNOSIS — D509 Iron deficiency anemia, unspecified: Secondary | ICD-10-CM | POA: Diagnosis not present

## 2021-06-13 DIAGNOSIS — D500 Iron deficiency anemia secondary to blood loss (chronic): Secondary | ICD-10-CM

## 2021-06-13 DIAGNOSIS — Z95828 Presence of other vascular implants and grafts: Secondary | ICD-10-CM

## 2021-06-13 DIAGNOSIS — C8510 Unspecified B-cell lymphoma, unspecified site: Secondary | ICD-10-CM

## 2021-06-13 LAB — CMP (CANCER CENTER ONLY)
ALT: 96 U/L — ABNORMAL HIGH (ref 0–44)
AST: 58 U/L — ABNORMAL HIGH (ref 15–41)
Albumin: 3.9 g/dL (ref 3.5–5.0)
Alkaline Phosphatase: 67 U/L (ref 38–126)
Anion gap: 7 (ref 5–15)
BUN: 16 mg/dL (ref 8–23)
CO2: 29 mmol/L (ref 22–32)
Calcium: 9.5 mg/dL (ref 8.9–10.3)
Chloride: 101 mmol/L (ref 98–111)
Creatinine: 0.68 mg/dL (ref 0.44–1.00)
GFR, Estimated: >60 mL/min (ref >60)
Glucose, Bld: 114 mg/dL — ABNORMAL HIGH (ref 70–99)
Potassium: 4.1 mmol/L (ref 3.5–5.1)
Sodium: 137 mmol/L (ref 135–145)
Total Bilirubin: 0.7 mg/dL (ref 0.3–1.2)
Total Protein: 6.4 g/dL — ABNORMAL LOW (ref 6.5–8.1)

## 2021-06-13 LAB — CBC WITH DIFFERENTIAL (CANCER CENTER ONLY)
Abs Immature Granulocytes: 0.02 10*3/uL (ref 0.00–0.07)
Basophils Absolute: 0 10*3/uL (ref 0.0–0.1)
Basophils Relative: 1 %
Eosinophils Absolute: 0.1 10*3/uL (ref 0.0–0.5)
Eosinophils Relative: 4 %
HCT: 31.3 % — ABNORMAL LOW (ref 36.0–46.0)
Hemoglobin: 10.6 g/dL — ABNORMAL LOW (ref 12.0–15.0)
Immature Granulocytes: 2 %
Lymphocytes Relative: 9 %
Lymphs Abs: 0.1 10*3/uL — ABNORMAL LOW (ref 0.7–4.0)
MCH: 30.9 pg (ref 26.0–34.0)
MCHC: 33.9 g/dL (ref 30.0–36.0)
MCV: 91.3 fL (ref 80.0–100.0)
Monocytes Absolute: 0 10*3/uL — ABNORMAL LOW (ref 0.1–1.0)
Monocytes Relative: 1 %
Neutro Abs: 1 10*3/uL — ABNORMAL LOW (ref 1.7–7.7)
Neutrophils Relative %: 83 %
Platelet Count: 61 10*3/uL — ABNORMAL LOW (ref 150–400)
RBC: 3.43 MIL/uL — ABNORMAL LOW (ref 3.87–5.11)
RDW: 16.9 % — ABNORMAL HIGH (ref 11.5–15.5)
WBC Count: 1.2 10*3/uL — ABNORMAL LOW (ref 4.0–10.5)
nRBC: 0 % (ref 0.0–0.2)

## 2021-06-13 MED ORDER — HEPARIN SOD (PORK) LOCK FLUSH 100 UNIT/ML IV SOLN
500.0000 [IU] | Freq: Once | INTRAVENOUS | Status: AC
  Administered 2021-06-13: 500 [IU] via INTRAVENOUS

## 2021-06-13 MED ORDER — PEGFILGRASTIM-BMEZ 6 MG/0.6ML ~~LOC~~ SOSY
6.0000 mg | PREFILLED_SYRINGE | Freq: Once | SUBCUTANEOUS | Status: AC
  Administered 2021-06-13: 6 mg via SUBCUTANEOUS
  Filled 2021-06-13: qty 0.6

## 2021-06-13 MED ORDER — SODIUM CHLORIDE 0.9% FLUSH
10.0000 mL | Freq: Once | INTRAVENOUS | Status: AC
  Administered 2021-06-13: 10 mL via INTRAVENOUS

## 2021-06-13 NOTE — Patient Instructions (Signed)

## 2021-06-13 NOTE — Patient Instructions (Signed)

## 2021-06-14 ENCOUNTER — Encounter: Payer: Self-pay | Admitting: *Deleted

## 2021-06-15 DIAGNOSIS — H35363 Drusen (degenerative) of macula, bilateral: Secondary | ICD-10-CM | POA: Diagnosis not present

## 2021-06-15 DIAGNOSIS — H353121 Nonexudative age-related macular degeneration, left eye, early dry stage: Secondary | ICD-10-CM | POA: Diagnosis not present

## 2021-06-15 DIAGNOSIS — H353112 Nonexudative age-related macular degeneration, right eye, intermediate dry stage: Secondary | ICD-10-CM | POA: Diagnosis not present

## 2021-06-15 DIAGNOSIS — H53131 Sudden visual loss, right eye: Secondary | ICD-10-CM | POA: Diagnosis not present

## 2021-06-15 DIAGNOSIS — H31091 Other chorioretinal scars, right eye: Secondary | ICD-10-CM | POA: Diagnosis not present

## 2021-06-15 DIAGNOSIS — H25813 Combined forms of age-related cataract, bilateral: Secondary | ICD-10-CM | POA: Diagnosis not present

## 2021-06-16 ENCOUNTER — Inpatient Hospital Stay: Payer: Medicare Other

## 2021-06-16 ENCOUNTER — Telehealth: Payer: Self-pay | Admitting: *Deleted

## 2021-06-16 ENCOUNTER — Encounter: Payer: Self-pay | Admitting: Hematology & Oncology

## 2021-06-16 ENCOUNTER — Telehealth: Payer: Self-pay

## 2021-06-16 ENCOUNTER — Other Ambulatory Visit: Payer: Self-pay

## 2021-06-16 DIAGNOSIS — C8510 Unspecified B-cell lymphoma, unspecified site: Secondary | ICD-10-CM

## 2021-06-16 DIAGNOSIS — Z79899 Other long term (current) drug therapy: Secondary | ICD-10-CM | POA: Diagnosis not present

## 2021-06-16 DIAGNOSIS — C8330 Diffuse large B-cell lymphoma, unspecified site: Secondary | ICD-10-CM | POA: Diagnosis not present

## 2021-06-16 DIAGNOSIS — D509 Iron deficiency anemia, unspecified: Secondary | ICD-10-CM | POA: Diagnosis not present

## 2021-06-16 DIAGNOSIS — Z9221 Personal history of antineoplastic chemotherapy: Secondary | ICD-10-CM | POA: Diagnosis not present

## 2021-06-16 LAB — CMP (CANCER CENTER ONLY)
ALT: 51 U/L — ABNORMAL HIGH (ref 0–44)
AST: 22 U/L (ref 15–41)
Albumin: 4.2 g/dL (ref 3.5–5.0)
Alkaline Phosphatase: 83 U/L (ref 38–126)
Anion gap: 6 (ref 5–15)
BUN: 21 mg/dL (ref 8–23)
CO2: 28 mmol/L (ref 22–32)
Calcium: 10 mg/dL (ref 8.9–10.3)
Chloride: 101 mmol/L (ref 98–111)
Creatinine: 0.74 mg/dL (ref 0.44–1.00)
GFR, Estimated: >60 mL/min (ref >60)
Glucose, Bld: 122 mg/dL — ABNORMAL HIGH (ref 70–99)
Potassium: 4.4 mmol/L (ref 3.5–5.1)
Sodium: 135 mmol/L (ref 135–145)
Total Bilirubin: 0.6 mg/dL (ref 0.3–1.2)
Total Protein: 6.7 g/dL (ref 6.5–8.1)

## 2021-06-16 LAB — CBC WITH DIFFERENTIAL (CANCER CENTER ONLY)
Abs Immature Granulocytes: 0.01 10*3/uL (ref 0.00–0.07)
Basophils Absolute: 0 10*3/uL (ref 0.0–0.1)
Basophils Relative: 4 %
Eosinophils Absolute: 0 10*3/uL (ref 0.0–0.5)
Eosinophils Relative: 12 %
HCT: 31.6 % — ABNORMAL LOW (ref 36.0–46.0)
Hemoglobin: 10.7 g/dL — ABNORMAL LOW (ref 12.0–15.0)
Immature Granulocytes: 4 %
Lymphocytes Relative: 61 %
Lymphs Abs: 0.2 10*3/uL — ABNORMAL LOW (ref 0.7–4.0)
MCH: 30.2 pg (ref 26.0–34.0)
MCHC: 33.9 g/dL (ref 30.0–36.0)
MCV: 89.3 fL (ref 80.0–100.0)
Monocytes Absolute: 0 10*3/uL — ABNORMAL LOW (ref 0.1–1.0)
Monocytes Relative: 0 %
Neutro Abs: 0.1 10*3/uL — CL (ref 1.7–7.7)
Neutrophils Relative %: 19 %
Platelet Count: 18 10*3/uL — ABNORMAL LOW (ref 150–400)
RBC: 3.54 MIL/uL — ABNORMAL LOW (ref 3.87–5.11)
RDW: 15.8 % — ABNORMAL HIGH (ref 11.5–15.5)
Smear Review: NORMAL
WBC Count: 0.3 10*3/uL — CL (ref 4.0–10.5)
nRBC: 0 % (ref 0.0–0.2)

## 2021-06-16 MED ORDER — HEPARIN SOD (PORK) LOCK FLUSH 100 UNIT/ML IV SOLN
500.0000 [IU] | Freq: Every day | INTRAVENOUS | Status: AC | PRN
  Administered 2021-06-16: 500 [IU]

## 2021-06-16 MED ORDER — HEPARIN SOD (PORK) LOCK FLUSH 100 UNIT/ML IV SOLN: 250.0000 [IU] | INTRAVENOUS | Status: DC | PRN

## 2021-06-16 MED ORDER — SODIUM CHLORIDE 0.9% FLUSH
10.0000 mL | INTRAVENOUS | Status: AC | PRN
  Administered 2021-06-16: 10 mL

## 2021-06-16 MED ORDER — SODIUM CHLORIDE 0.9% FLUSH: 3.0000 mL | INTRAVENOUS | Status: DC | PRN

## 2021-06-16 MED ORDER — SODIUM CHLORIDE 0.9% IV SOLUTION: 250.0000 mL | Freq: Once | INTRAVENOUS | Status: DC

## 2021-06-16 NOTE — Telephone Encounter (Signed)
Critical ANC of 0.3 reported by lab. MD aware.  ?

## 2021-06-16 NOTE — Telephone Encounter (Signed)
Dr. Ennever notified of ANC-0.1.  No new orders received at this time.  

## 2021-06-16 NOTE — Patient Instructions (Signed)

## 2021-06-16 NOTE — Progress Notes (Signed)
1 unit of plt per MD ?

## 2021-06-16 NOTE — Progress Notes (Signed)
Patient discharged home from the hospital. Will come in this week for lab check. No further follow up.  ? ?Spoke to Dr Marin Olp and he will see patient next week.  ? ?Oncology Nurse Navigator Documentation ? ? ?  06/14/2021  ? 11:37 AM  ?Oncology Nurse Navigator Flowsheets  ?Navigator Follow Up Date: 06/20/2021  ?Navigator Follow Up Reason: Follow-up Appointment  ?Navigator Location [facility]  ?Navigator Encounter Type Appt/Treatment Plan Review  ?Patient Visit Type MedOnc  ?Treatment Phase Active Tx  ?Barriers/Navigation Needs Coordination of Care;Education  ?Interventions Coordination of Care  ?Acuity Level 2-Minimal Needs (1-2 Barriers Identified)  ?Coordination of Care Appts  ?Support Groups/Services Friends and Family  ?Time Spent with Patient 15  ?  ?

## 2021-06-17 LAB — PREPARE PLATELET PHERESIS: Unit division: 0

## 2021-06-17 LAB — BPAM PLATELET PHERESIS
Blood Product Expiration Date: 202304222359
ISSUE DATE / TIME: 202304201053
Unit Type and Rh: 6200

## 2021-06-20 ENCOUNTER — Encounter: Payer: Self-pay | Admitting: Hematology & Oncology

## 2021-06-20 ENCOUNTER — Inpatient Hospital Stay: Payer: Medicare Other

## 2021-06-20 ENCOUNTER — Inpatient Hospital Stay: Payer: Medicare Other | Admitting: Hematology & Oncology

## 2021-06-20 ENCOUNTER — Telehealth: Payer: Self-pay

## 2021-06-20 ENCOUNTER — Encounter: Payer: Self-pay | Admitting: *Deleted

## 2021-06-20 VITALS — BP 125/69 | HR 87 | Temp 98.3°F | Resp 20 | Wt 153.4 lb

## 2021-06-20 DIAGNOSIS — C8510 Unspecified B-cell lymphoma, unspecified site: Secondary | ICD-10-CM

## 2021-06-20 DIAGNOSIS — D509 Iron deficiency anemia, unspecified: Secondary | ICD-10-CM | POA: Diagnosis not present

## 2021-06-20 DIAGNOSIS — Z9221 Personal history of antineoplastic chemotherapy: Secondary | ICD-10-CM | POA: Diagnosis not present

## 2021-06-20 DIAGNOSIS — Z79899 Other long term (current) drug therapy: Secondary | ICD-10-CM | POA: Diagnosis not present

## 2021-06-20 DIAGNOSIS — C8330 Diffuse large B-cell lymphoma, unspecified site: Secondary | ICD-10-CM | POA: Diagnosis not present

## 2021-06-20 LAB — CMP (CANCER CENTER ONLY)
ALT: 27 U/L (ref 0–44)
AST: 15 U/L (ref 15–41)
Albumin: 4.1 g/dL (ref 3.5–5.0)
Alkaline Phosphatase: 94 U/L (ref 38–126)
Anion gap: 6 (ref 5–15)
BUN: 19 mg/dL (ref 8–23)
CO2: 29 mmol/L (ref 22–32)
Calcium: 10.1 mg/dL (ref 8.9–10.3)
Chloride: 101 mmol/L (ref 98–111)
Creatinine: 0.72 mg/dL (ref 0.44–1.00)
GFR, Estimated: >60 mL/min (ref >60)
Glucose, Bld: 122 mg/dL — ABNORMAL HIGH (ref 70–99)
Potassium: 4.2 mmol/L (ref 3.5–5.1)
Sodium: 136 mmol/L (ref 135–145)
Total Bilirubin: 0.5 mg/dL (ref 0.3–1.2)
Total Protein: 6.8 g/dL (ref 6.5–8.1)

## 2021-06-20 LAB — CBC WITH DIFFERENTIAL (CANCER CENTER ONLY)
Abs Immature Granulocytes: 0 10*3/uL (ref 0.00–0.07)
Basophils Absolute: 0 10*3/uL (ref 0.0–0.1)
Basophils Relative: 3 %
Eosinophils Absolute: 0 10*3/uL (ref 0.0–0.5)
Eosinophils Relative: 9 %
HCT: 27.6 % — ABNORMAL LOW (ref 36.0–46.0)
Hemoglobin: 9.5 g/dL — ABNORMAL LOW (ref 12.0–15.0)
Immature Granulocytes: 0 %
Lymphocytes Relative: 67 %
Lymphs Abs: 0.2 10*3/uL — ABNORMAL LOW (ref 0.7–4.0)
MCH: 30.3 pg (ref 26.0–34.0)
MCHC: 34.4 g/dL (ref 30.0–36.0)
MCV: 87.9 fL (ref 80.0–100.0)
Monocytes Absolute: 0 10*3/uL — ABNORMAL LOW (ref 0.1–1.0)
Monocytes Relative: 6 %
Neutro Abs: 0.1 10*3/uL — CL (ref 1.7–7.7)
Neutrophils Relative %: 15 %
Platelet Count: 5 10*3/uL — CL (ref 150–400)
RBC: 3.14 MIL/uL — ABNORMAL LOW (ref 3.87–5.11)
RDW: 14.9 % (ref 11.5–15.5)
Smear Review: NORMAL
WBC Count: 0.3 10*3/uL — CL (ref 4.0–10.5)
nRBC: 0 % (ref 0.0–0.2)

## 2021-06-20 LAB — TYPE AND SCREEN
ABO/RH(D): A POS
Antibody Screen: NEGATIVE

## 2021-06-20 MED ORDER — KETOROLAC TROMETHAMINE 15 MG/ML IJ SOLN
30.0000 mg | Freq: Once | INTRAMUSCULAR | Status: DC
  Filled 2021-06-20: qty 2

## 2021-06-20 MED ORDER — HEPARIN SOD (PORK) LOCK FLUSH 100 UNIT/ML IV SOLN
250.0000 [IU] | INTRAVENOUS | Status: AC | PRN
  Administered 2021-06-20: 500 [IU]

## 2021-06-20 MED ORDER — SODIUM CHLORIDE 0.9% FLUSH
10.0000 mL | INTRAVENOUS | Status: AC | PRN
  Administered 2021-06-20: 10 mL

## 2021-06-20 MED ORDER — KETOROLAC TROMETHAMINE 15 MG/ML IJ SOLN
30.0000 mg | Freq: Once | INTRAMUSCULAR | Status: AC
  Administered 2021-06-20: 30 mg via INTRAVENOUS

## 2021-06-20 MED ORDER — SODIUM CHLORIDE 0.9% IV SOLUTION
250.0000 mL | Freq: Once | INTRAVENOUS | Status: AC
  Administered 2021-06-20: 250 mL via INTRAVENOUS

## 2021-06-20 MED ORDER — ACETAMINOPHEN 325 MG PO TABS: 650.0000 mg | ORAL_TABLET | Freq: Once | ORAL | Status: DC

## 2021-06-20 NOTE — Progress Notes (Signed)
?Hematology and Oncology Follow Up Visit ? ?Ariel [name] ?[id number] ?08-24-1955 66 y.o. ?06/20/2021 ? ? ?Principle Diagnosis:  ?Diffuse large cell non-Hodgkin's lymphoma-CNS involvement ? ?Current Therapy:   ?Status post chemotherapy with R-ICE/R-MTV --  s/p cycle #4 ?MATRIX -- s/p cycle #2 -- start on 05/02/2021 ?Neulasta 6 mg subcu post chemotherapy ?    ?Interim History:  Ariel [name] is comes in for follow-up.  Not surprised by the fact that her blood counts are down quite a bit.  We have been checking her labs twice a week.  We gave her platelets last week.  Platelet count today is 5000.  Her white cell count is 300.  Thankfully, her hemoglobin is not too bad at 9.5. ? ?We will have to go ahead and give her some platelets today. ? ?Her left hip is bothering her.  She may have little bit of arthritis in that hip.  She is taking some Tylenol for this.  I think the fact that she has no white cells could certainly be a factor.  I do not think there is any bleeding to the hip.  There is no swelling.  She is able to walk.  We will give her a dose of Toradol in the office. ? ?She will have the MRI of the brain next week and we will see how that looks. ? ?She has had no fever.  She is on broad coverage antibiotics along with antivirals and antifungal. ? ?She has had no change in bowel or bladder habits.  There is been no diarrhea.  She has had no dysuria. ? ?She has had no ecchymoses.  There is been no hematomas in the oral cavity. ? ?She has had no headache. ? ?She says she is going to the eye doctor tomorrow for her right eye. ? ?Overall, I would say that her performance status right now is ECOG 1.   ? ?Medications:  ?Current Outpatient Medications:  ?  acetaminophen (TYLENOL) 500 MG tablet, Take 500-1,000 mg by mouth every 6 (six) hours as needed for mild pain or headache., Disp: , Rfl:  ?  acyclovir (ZOVIRAX) 200 MG capsule, Take 1 capsule (200 mg total) by mouth 2 (two) times daily. (Patient taking differently: Take  200 mg by mouth 2 (two) times daily. continuous), Disp: 60 capsule, Rfl: 3 ?  antiseptic oral rinse (BIOTENE) LIQD, 15 mLs by Mouth Rinse route every 4 (four) hours. (Patient taking differently: 15 mLs by Mouth Rinse route every 4 (four) hours as needed for dry mouth or mouth pain.), Disp: 500 mL, Rfl: 4 ?  ciprofloxacin (CIPRO) 500 MG tablet, Take 1 tablet (500 mg total) by mouth daily with breakfast., Disp: 15 tablet, Rfl: 4 ?  docusate sodium (COLACE) 100 MG capsule, Take 1 capsule (100 mg total) by mouth 2 (two) times daily as needed for mild constipation. (Patient taking differently: Take 100 mg by mouth at bedtime.), Disp: 10 capsule, Rfl: 0 ?  OVER THE COUNTER MEDICATION, Take 3 capsules by mouth See admin instructions. Balance of Nature VEGETABLE formulation capsules- Take 3 capsules by mouth every morning, Disp: , Rfl:  ?  OVER THE COUNTER MEDICATION, Take 3 capsules by mouth See admin instructions. Balance of Nature FRUIT formulation capsules- Take 3 capsules by mouth every morning, Disp: , Rfl:  ?  VUITY 1.25 % SOLN, Place 1 drop into both eyes in the morning and at bedtime., Disp: , Rfl:  ?  OLANZapine (ZYPREXA) 5 MG tablet, Take 1 tablet (5 mg  total) by mouth daily. (Patient not taking: Reported on 06/20/2021), Disp: 30 tablet, Rfl: 4 ?  ondansetron (ZOFRAN) 8 MG tablet, Take 1 tablet (8 mg total) by mouth every 8 (eight) hours as needed for nausea or vomiting. (Patient not taking: Reported on 06/20/2021), Disp: 30 tablet, Rfl: 3 ? ?Allergies:  ?Allergies  ?Allergen Reactions  ? Decadron [Dexamethasone] Other (See Comments)  ?  Made the patient's legs tired and she could not walk  ? Plavix [Clopidogrel] Hives, Swelling and Other (See Comments)  ?  Lips became swollen ?  ? Prednisone Other (See Comments)  ?  Steroids caused psychological issues   ? Codeine Nausea Only  ? ? ?Past Medical History, Surgical history, Social history, and Family History were reviewed and updated. ? ?Review of Systems: ?Review of  Systems  ?Constitutional: Negative.   ?HENT:  Negative.    ?Eyes: Negative.   ?Respiratory: Negative.    ?Cardiovascular: Negative.   ?Gastrointestinal: Negative.   ?Endocrine: Negative.   ?Genitourinary: Negative.    ?Musculoskeletal: Negative.   ?Skin: Negative.   ?Neurological: Negative.   ?Hematological: Negative.   ?Psychiatric/Behavioral: Negative.    ? ?Physical Exam: ? weight is 153 lb 6.4 oz (69.6 kg). Her oral temperature is 98.3 ?F (36.8 ?C). Her blood pressure is 125/69 and her pulse is 87. Her respiration is 20 and oxygen saturation is 100%.  ? ?Wt Readings from Last 3 Encounters:  ?06/20/21 153 lb 6.4 oz (69.6 kg)  ?06/10/21 160 lb 0.9 oz (72.6 kg)  ?05/26/21 152 lb (68.9 kg)  ? ? ?Physical Exam ?Vitals reviewed.  ?HENT:  ?   Head: Normocephalic and atraumatic.  ?   Mouth/Throat:  ?   Comments: Oral exam shows multiple sites of gingival bleeding.  She has gauze in her mouth to try to help with the bleeding.  There is no obvious gingival breakdown. ?Eyes:  ?   Pupils: Pupils are equal, round, and reactive to light.  ?Cardiovascular:  ?   Rate and Rhythm: Normal rate and regular rhythm.  ?   Heart sounds: Normal heart sounds.  ?   Comments: Cardiac exam is regular rate and rhythm with no murmurs, rubs or bruits. ?Pulmonary:  ?   Effort: Pulmonary effort is normal.  ?   Breath sounds: Normal breath sounds.  ?Abdominal:  ?   General: Bowel sounds are normal.  ?   Palpations: Abdomen is soft.  ?Musculoskeletal:     ?   General: No tenderness or deformity. Normal range of motion.  ?   Cervical back: Normal range of motion.  ?Lymphadenopathy:  ?   Cervical: No cervical adenopathy.  ?Skin: ?   General: Skin is warm and dry.  ?   Findings: No erythema or rash.  ?   Comments: Skin exam does show petechia on her lower legs.  ?Neurological:  ?   Mental Status: She is alert and oriented to person, place, and time.  ?   Comments: Neurological exam shows no focal neurological deficits.  All cranial nerves are intact.   She has good strength in upper and lower extremities.  ?Psychiatric:     ?   Behavior: Behavior normal.     ?   Thought Content: Thought content normal.     ?   Judgment: Judgment normal.  ? ? ? ?Lab Results  ?Component Value Date  ? WBC 0.3 (LL) 06/16/2021  ? HGB 10.7 (L) 06/16/2021  ? HCT 31.6 (L) 06/16/2021  ? MCV 89.3  06/16/2021  ? PLT 18 (L) 06/16/2021  ? ?  Chemistry   ?   ?Component Value Date/Time  ? NA 135 06/16/2021 0919  ? K 4.4 06/16/2021 0919  ? CL 101 06/16/2021 0919  ? CO2 28 06/16/2021 0919  ? BUN 21 06/16/2021 0919  ? CREATININE 0.74 06/16/2021 0919  ? CREATININE 0.68 11/18/2012 1439  ?    ?Component Value Date/Time  ? CALCIUM 10.0 06/16/2021 0919  ? ALKPHOS 83 06/16/2021 0919  ? AST 22 06/16/2021 0919  ? ALT 51 (H) 06/16/2021 0919  ? BILITOT 0.6 06/16/2021 0919  ?  ? ? ?Impression and Plan: ?Ariel [name] is a very charming 66 year old white female.  She initially came to [place] with iron deficiency anemia.  We subsequently found that she had diffuse large cell non-Hodgkin's lymphoma.  It had traveled to her brain. ? ?She has had 4 cycles of treatment.  We basically gave her R-ICE alternating with R-MTV.  She tolerated this very well. ? ?She has had 2 cycles of the MATRIX protocol.  Again she is pancytopenic because of this.  We even did some dosage adjustments with the second cycle of MATRIX she still has had low white cells. ? ?We will do the MRI next week. ? ?Hopefully, we will plan for her third cycle of treatment with MATRIX to be done in a couple weeks. ? ?Again we will give her platelets today.  We will have him come back for lab work only in 3 days.     ? ?Volanda Napoleon, MD ?4/24/20238:36 AM  ?

## 2021-06-20 NOTE — Patient Instructions (Signed)
Thrombocytopenia Thrombocytopenia is a condition in which there are a low number of platelets in the blood. Platelets are also called thrombocytes. Platelets are parts of blood that stick together and form a clot to help the body stop bleeding after an injury. If you have too few platelets, your blood may have trouble clotting. This may cause you to bleed and bruise very easily. Some cases of thrombocytopenia are mild while others are more severe. What are the causes? This condition is caused by a low number of platelets in your blood. There are three main reasons for this: Your body not making enough platelets. This may be caused by: Bone marrow diseases. This include aplastic anemia, leukemia, and myelodysplastic anemia. Congenital thrombocytopenia. This is a condition that is passed from parent to child (inherited). Certain cancer treatments, including chemotherapy and radiation therapy. Infections from bacteria or viruses. Alcohol use disorder and alcoholism. Platelets not being released in the blood. This is called platelet sequestration and it can happen due to: An overactive spleen (hypersplenism). The spleen gathers up platelets from circulation, meaning that the platelets are not available to help with clotting your blood. The spleen can be enlarged because of scarring or other conditions. Gaucher disease. Your body destroying platelets too quickly. This may be caused by: An autoimmune disease that causes immune thrombocytopenia (ITP). ITP is sometimes associated with other autoimmune conditions such as lupus. Certain medicines, such as blood thinners. Certain blood clotting or bleeding disorders. Exposure to toxic chemicals, such as pesticides, lead, benzene, and arsenic. Pregnancy. What are the signs or symptoms? Symptoms of this condition are the result of poor blood clotting. They will vary depending on how low the platelet counts are. Symptoms may include: Bruising  easily. Bleeding from the mouth or nose. Heavy menstrual periods. Blood in the urine, stool (feces), or vomit. Purplish-red discolorations on the skin (purpura). A rash that looks like pinpoint, purplish-red spots (petechiae) on the lower legs. How is this diagnosed?  This condition may be diagnosed with blood tests and a physical exam. You may also have other tests, including: A sample of bone marrow (biopsy) may be removed to look for the original cells that make platelets. An ultrasound or CT scan of the abdomen to check for an enlarged spleen, enlarged lymph nodes, or liver problems. How is this treated? Treatment for this condition depends on the cause. Treatment may include: Treatment of another condition that is causing the low platelet count. Medicines to help protect your platelets from being destroyed. A replacement (transfusion) of platelets to stop or prevent bleeding. Surgery to remove the spleen. Follow these instructions at home: Medicines Take over-the-counter and prescription medicines only as told by your health care provider. Do not take any medicines that contain aspirin or NSAIDs, such as ibuprofen. These medicines increase your risk for dangerous bleeding. Activity Avoid activities that could cause injury or bruising, and follow instructions about how to prevent falls. Do not play contact sports. Ask your health care provider what activities are safe for you. Take extra care to protect yourself from burns when ironing or cooking. Take extra care not to cut yourself when you shave or when you use scissors, needles, knives, and other tools. General instructions  Check your skin and the inside of your mouth for bruising or bleeding as told by your health care provider. Wear a medical alert bracelet that says that you have a bleeding disorder. This can help you get the treatment you need in case of emergency. Check   your urine and stool for blood as told by your health  care provider. Do not drink alcohol. If you do drink alcohol, limit the amount that you drink. Minimize contact with toxic chemicals. Tell all your health care providers, including dental care providers and eye doctors, about your condition. Make sure to tell dental care providers before you have any procedure done, including dental cleanings. Keep all follow-up visits. This is important. Contact a health care provider if: You have unexplained bruising. You have new symptoms. You have symptoms that get worse. You have a fever. Get help right away if: You have severe bleeding from anywhere on your body. You have blood in your vomit, urine, or stool. You have an injury to your head. You have a sudden, severe headache. Summary Thrombocytopenia is a condition in which you have a low number of platelets in the blood. Platelets are parts of blood that stick together to form a clot. Symptoms of this condition are the result of poor blood clotting and may include bruising easily, bleeding from the nose or mouth, petechiae, and purpura. This condition may be diagnosed with blood tests and a physical exam. Treatment for this condition depends on the cause. This information is not intended to replace advice given to you by your health care provider. Make sure you discuss any questions you have with your health care provider. Document Revised: 07/29/2020 Document Reviewed: 07/29/2020 Elsevier Patient Education  2023 [employer]  

## 2021-06-20 NOTE — Patient Instructions (Signed)

## 2021-06-20 NOTE — Progress Notes (Signed)
Patient will need an MRI prior to her next appointment. MRI scheduled.  ? ?Reviewed appointment with patient. She is aware of MRI appointment including date, time and location. Radiology info sheet also given and reviewed with patient.  ? ?Oncology Nurse Navigator Documentation ? ? ?  06/20/2021  ?  8:15 AM  ?Oncology Nurse Navigator Flowsheets  ?Navigator Follow Up Date: 06/29/2021  ?Navigator Follow Up Reason: Scan Review  ?Navigator Location [facility]  ?Navigator Encounter Type Treatment;Appt/Treatment Plan Review  ?Patient Visit Type MedOnc  ?Treatment Phase Active Tx  ?Barriers/Navigation Needs Coordination of Care;Education  ?Education Other  ?Interventions Coordination of Care;Education;Psycho-Social Support  ?Acuity Level 2-Minimal Needs (1-2 Barriers Identified)  ?Coordination of Care Radiology  ?Education Method Verbal;Written  ?Support Groups/Services Friends and Family  ?Time Spent with Patient 30  ?  ?

## 2021-06-20 NOTE — Telephone Encounter (Signed)
Panic anc 0.1 and plt count of 5 received from lab. Md aware. Pt to get 1 unit of plt today  ?

## 2021-06-21 DIAGNOSIS — H4313 Vitreous hemorrhage, bilateral: Secondary | ICD-10-CM | POA: Diagnosis not present

## 2021-06-21 DIAGNOSIS — H2513 Age-related nuclear cataract, bilateral: Secondary | ICD-10-CM | POA: Diagnosis not present

## 2021-06-21 LAB — PREPARE PLATELET PHERESIS: Unit division: 0

## 2021-06-21 LAB — BPAM PLATELET PHERESIS
Blood Product Expiration Date: 202304272359
ISSUE DATE / TIME: 202304241301
Unit Type and Rh: 6200

## 2021-06-22 ENCOUNTER — Encounter: Payer: Self-pay | Admitting: Hematology & Oncology

## 2021-06-22 DIAGNOSIS — R1032 Left lower quadrant pain: Secondary | ICD-10-CM

## 2021-06-23 ENCOUNTER — Other Ambulatory Visit: Payer: Self-pay | Admitting: *Deleted

## 2021-06-23 ENCOUNTER — Other Ambulatory Visit: Payer: Self-pay

## 2021-06-23 ENCOUNTER — Encounter: Payer: Self-pay | Admitting: Hematology & Oncology

## 2021-06-23 ENCOUNTER — Other Ambulatory Visit: Payer: Self-pay | Admitting: Hematology & Oncology

## 2021-06-23 ENCOUNTER — Inpatient Hospital Stay: Payer: Medicare Other

## 2021-06-23 ENCOUNTER — Encounter: Payer: Self-pay | Admitting: *Deleted

## 2021-06-23 ENCOUNTER — Inpatient Hospital Stay (HOSPITAL_BASED_OUTPATIENT_CLINIC_OR_DEPARTMENT_OTHER): Payer: Medicare Other | Admitting: Hematology & Oncology

## 2021-06-23 ENCOUNTER — Ambulatory Visit (HOSPITAL_BASED_OUTPATIENT_CLINIC_OR_DEPARTMENT_OTHER)
Admission: RE | Admit: 2021-06-23 | Discharge: 2021-06-23 | Disposition: A | Discharge status: Home / Self Care | Payer: Medicare Other | Source: Ambulatory Visit | Attending: Hematology & Oncology | Admitting: Hematology & Oncology

## 2021-06-23 VITALS — BP 143/59 | HR 89 | Temp 98.7°F | Resp 18 | Wt 154.0 lb

## 2021-06-23 DIAGNOSIS — C8510 Unspecified B-cell lymphoma, unspecified site: Secondary | ICD-10-CM

## 2021-06-23 DIAGNOSIS — R1032 Left lower quadrant pain: Secondary | ICD-10-CM

## 2021-06-23 DIAGNOSIS — D500 Iron deficiency anemia secondary to blood loss (chronic): Secondary | ICD-10-CM

## 2021-06-23 DIAGNOSIS — Z95828 Presence of other vascular implants and grafts: Secondary | ICD-10-CM

## 2021-06-23 DIAGNOSIS — M25552 Pain in left hip: Secondary | ICD-10-CM

## 2021-06-23 DIAGNOSIS — Z8572 Personal history of non-Hodgkin lymphomas: Secondary | ICD-10-CM | POA: Diagnosis not present

## 2021-06-23 DIAGNOSIS — Z9221 Personal history of antineoplastic chemotherapy: Secondary | ICD-10-CM | POA: Diagnosis not present

## 2021-06-23 DIAGNOSIS — I878 Other specified disorders of veins: Secondary | ICD-10-CM | POA: Diagnosis not present

## 2021-06-23 DIAGNOSIS — C8330 Diffuse large B-cell lymphoma, unspecified site: Secondary | ICD-10-CM | POA: Diagnosis not present

## 2021-06-23 DIAGNOSIS — Z79899 Other long term (current) drug therapy: Secondary | ICD-10-CM | POA: Diagnosis not present

## 2021-06-23 DIAGNOSIS — D509 Iron deficiency anemia, unspecified: Secondary | ICD-10-CM | POA: Diagnosis not present

## 2021-06-23 LAB — CBC WITH DIFFERENTIAL (CANCER CENTER ONLY)
Abs Immature Granulocytes: 0.14 10*3/uL — ABNORMAL HIGH (ref 0.00–0.07)
Basophils Absolute: 0.1 10*3/uL (ref 0.0–0.1)
Basophils Relative: 1 %
Eosinophils Absolute: 0 10*3/uL (ref 0.0–0.5)
Eosinophils Relative: 1 %
HCT: 27.2 % — ABNORMAL LOW (ref 36.0–46.0)
Hemoglobin: 9.2 g/dL — ABNORMAL LOW (ref 12.0–15.0)
Immature Granulocytes: 2 %
Lymphocytes Relative: 9 %
Lymphs Abs: 0.5 10*3/uL — ABNORMAL LOW (ref 0.7–4.0)
MCH: 30.1 pg (ref 26.0–34.0)
MCHC: 33.8 g/dL (ref 30.0–36.0)
MCV: 88.9 fL (ref 80.0–100.0)
Monocytes Absolute: 0.8 10*3/uL (ref 0.1–1.0)
Monocytes Relative: 13 %
Neutro Abs: 4.2 10*3/uL (ref 1.7–7.7)
Neutrophils Relative %: 74 %
Platelet Count: 21 10*3/uL — ABNORMAL LOW (ref 150–400)
RBC: 3.06 MIL/uL — ABNORMAL LOW (ref 3.87–5.11)
RDW: 14.9 % (ref 11.5–15.5)
WBC Count: 5.8 10*3/uL (ref 4.0–10.5)
nRBC: 0 % (ref 0.0–0.2)

## 2021-06-23 LAB — CMP (CANCER CENTER ONLY)
ALT: 21 U/L (ref 0–44)
AST: 16 U/L (ref 15–41)
Albumin: 4.1 g/dL (ref 3.5–5.0)
Alkaline Phosphatase: 123 U/L (ref 38–126)
Anion gap: 7 (ref 5–15)
BUN: 16 mg/dL (ref 8–23)
CO2: 30 mmol/L (ref 22–32)
Calcium: 10.3 mg/dL (ref 8.9–10.3)
Chloride: 101 mmol/L (ref 98–111)
Creatinine: 0.81 mg/dL (ref 0.44–1.00)
GFR, Estimated: >60 mL/min (ref >60)
Glucose, Bld: 99 mg/dL (ref 70–99)
Potassium: 4.1 mmol/L (ref 3.5–5.1)
Sodium: 138 mmol/L (ref 135–145)
Total Bilirubin: 0.3 mg/dL (ref 0.3–1.2)
Total Protein: 6.8 g/dL (ref 6.5–8.1)

## 2021-06-23 LAB — SAMPLE TO BLOOD BANK

## 2021-06-23 IMAGING — DX DG HIP (WITH OR WITHOUT PELVIS) 2-3V*L*
3 series · 3 of 3 positions shown · non-contrast
Comparison: None.

CLINICAL DATA: Left groin and hip pain for 1 week. History of
B-cell lymphoma.

EXAM:
DG HIP (WITH OR WITHOUT PELVIS) 2-3V LEFT

[pelvis ap]
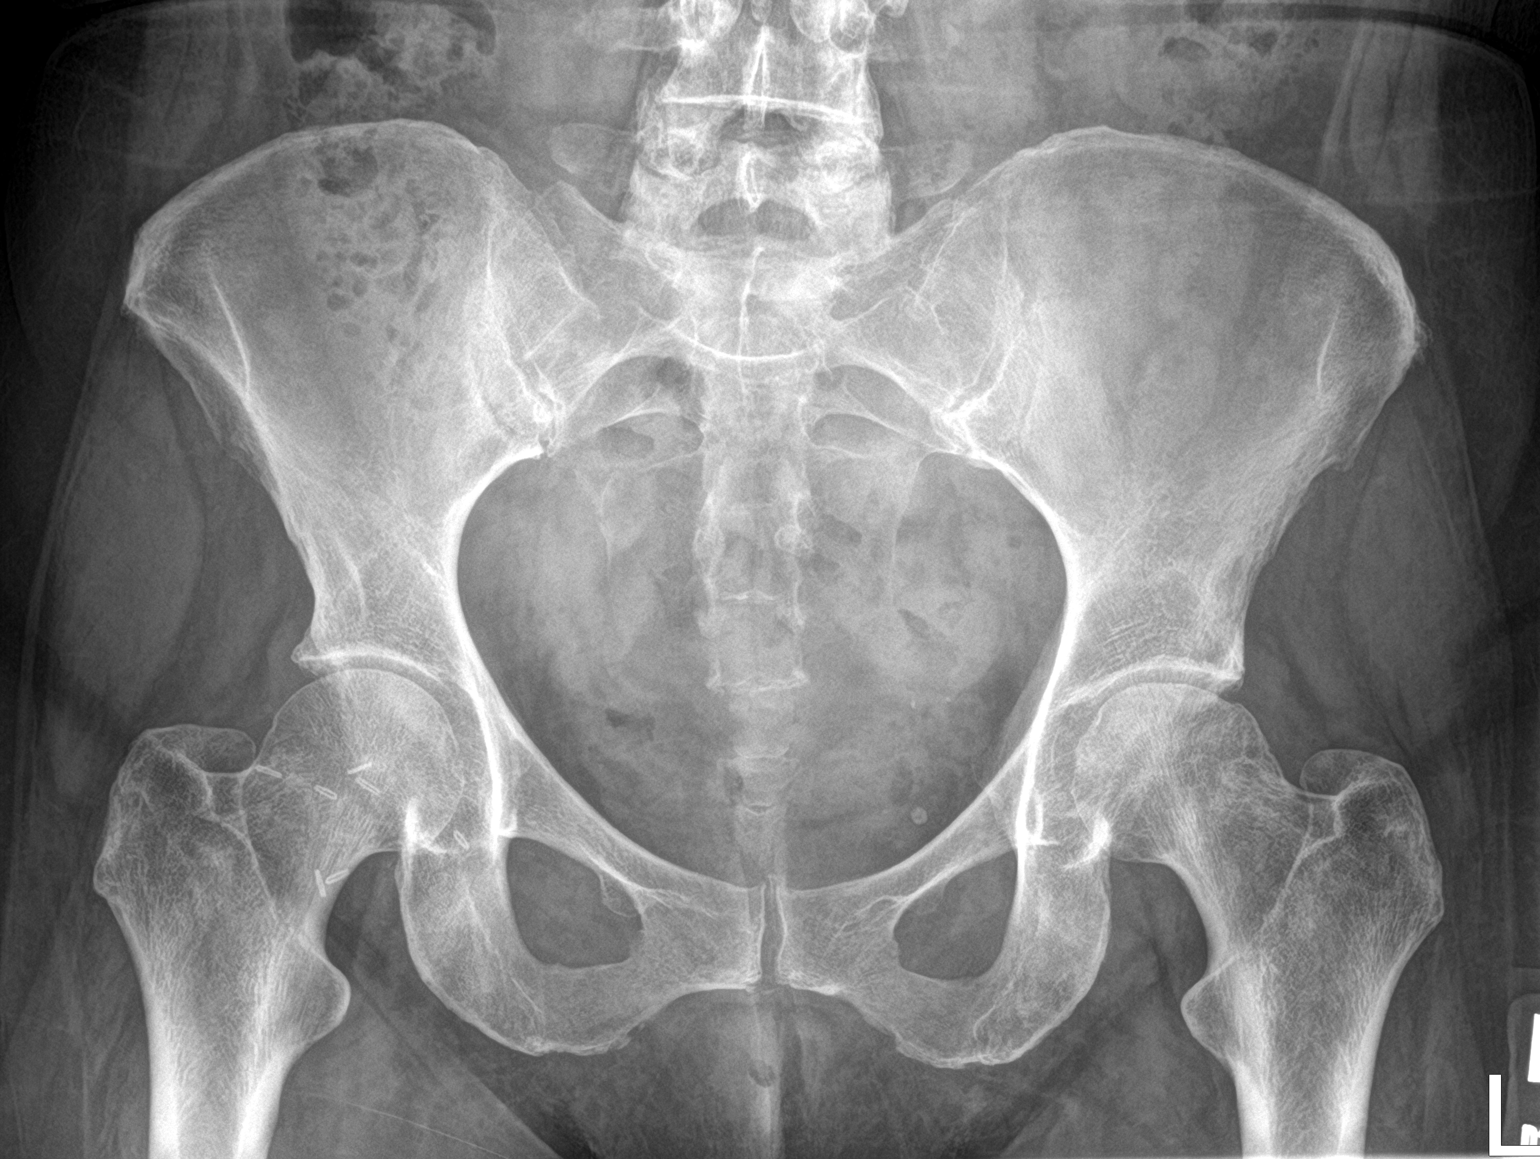

[hip ap]
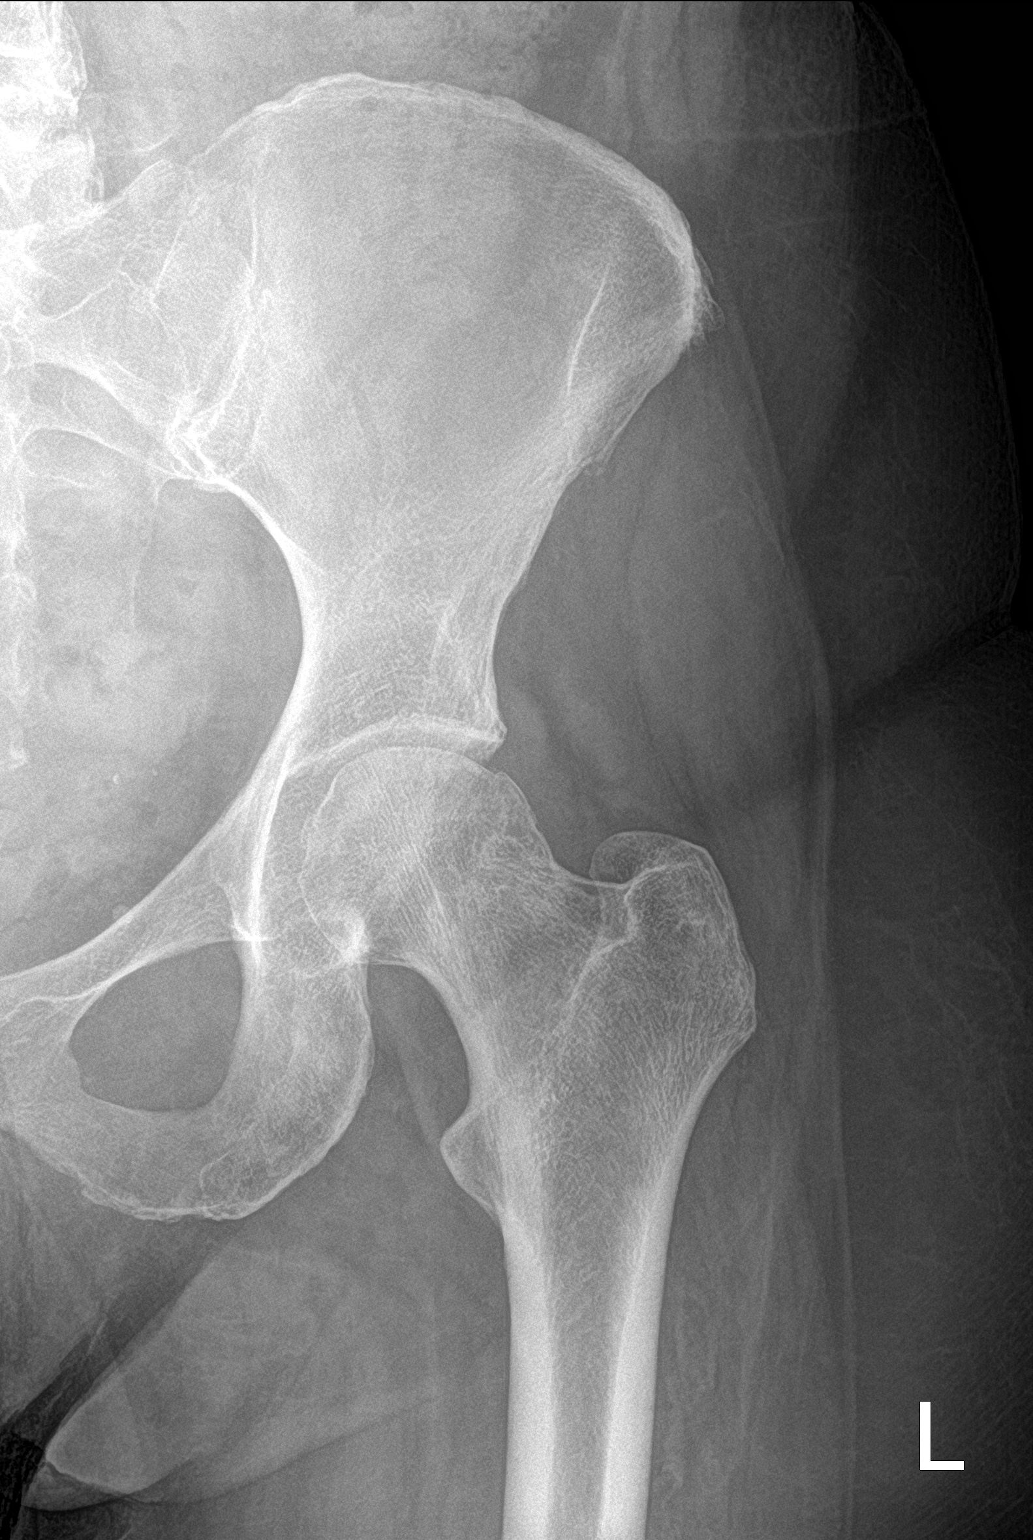

[hip lat]
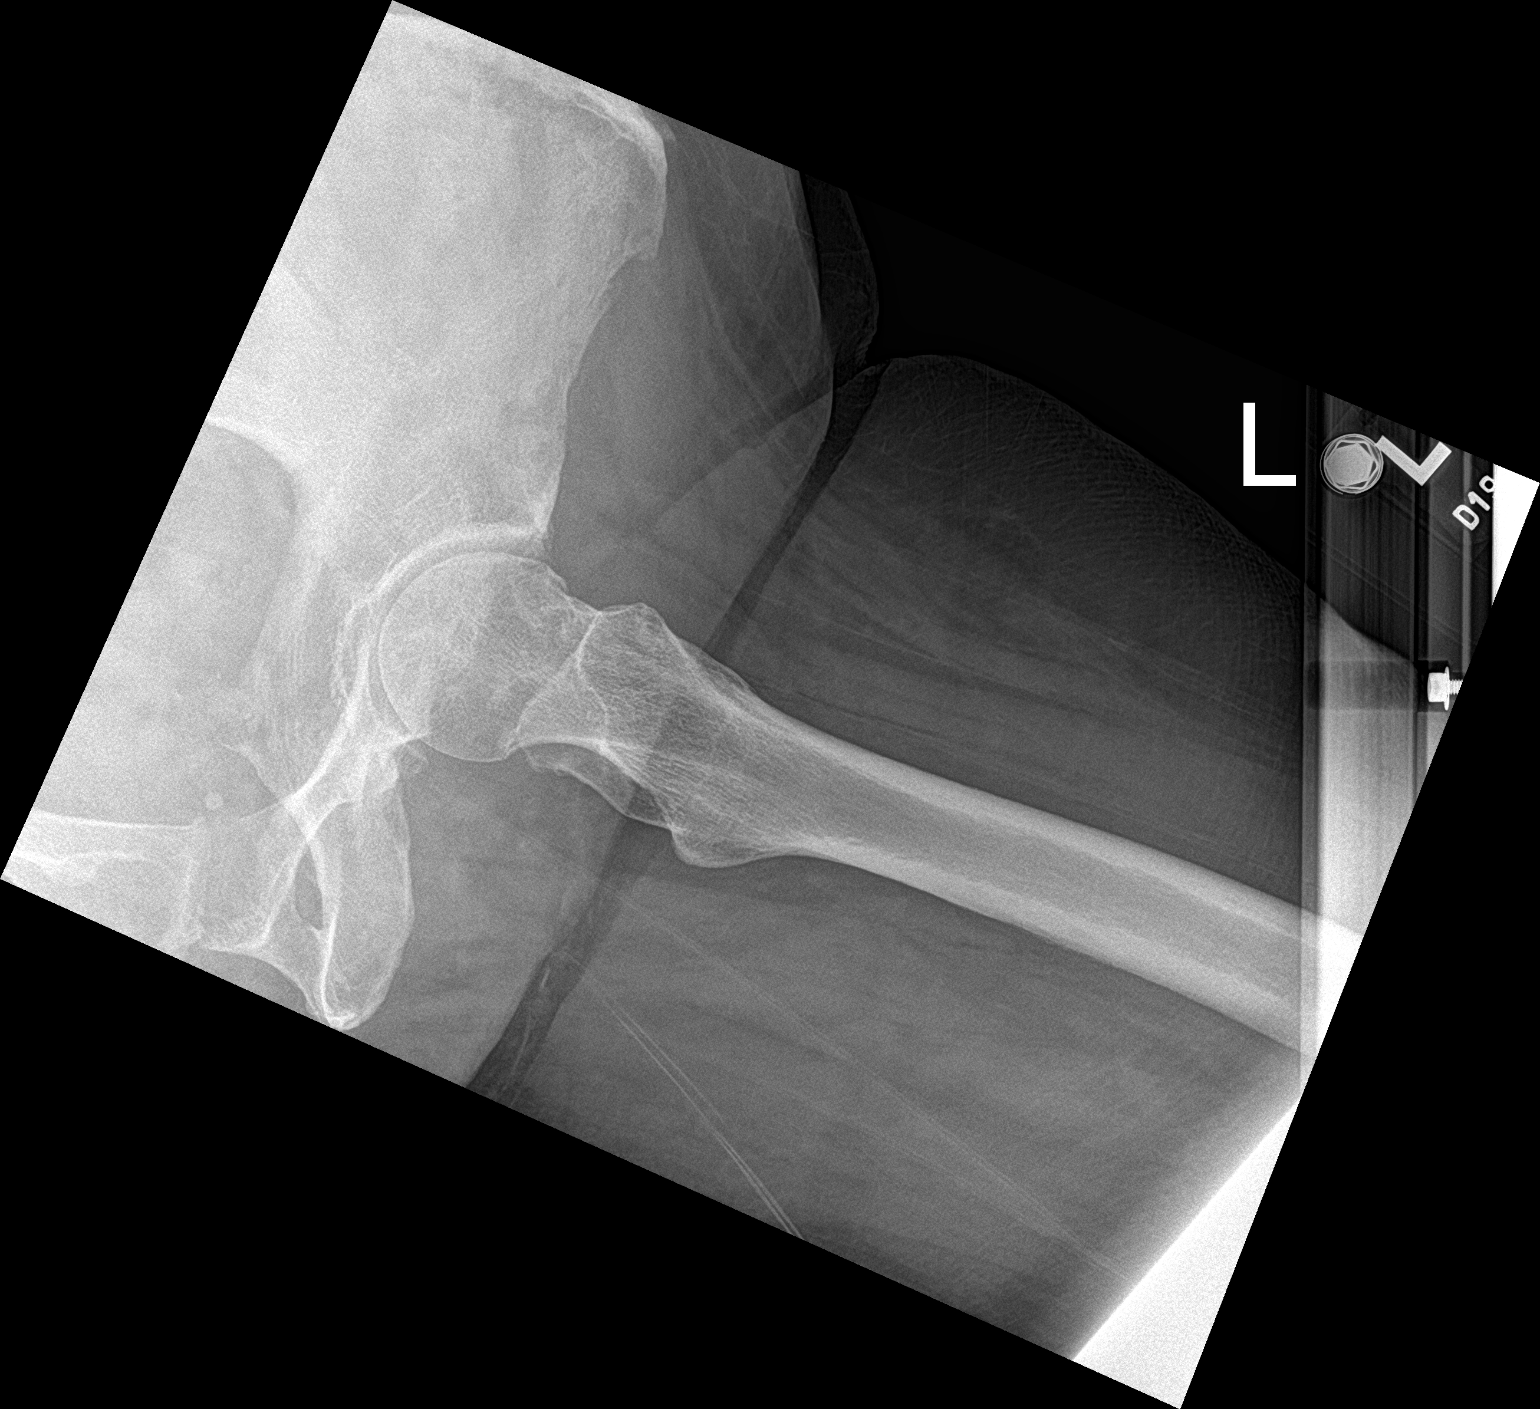

[3 of 3 positions shown; findings below may reference images not displayed]

FINDINGS: There is a mild lucent crescentic sign within the superior left
femoral head with mild approximate 2 mm superior left femoral head
cortical flattening/collapse. Mild hazy sclerosis within the
superomedial left femoral head.

No significant femoroacetabular joint space narrowing on either
side. Minimal peripheral pubic symphysis degenerative osteophytes.
Mild bilateral inferior sacroiliac joint subchondral sclerosis.
Surgical clips overlie the proximal right femur. Vascular
phleboliths overlie the left hemipelvis.
IMPRESSION: Findings suggesting superior left femoral head avascular necrosis
with mild superior femoral head collapse. This is consistent with
stage III avascular necrosis.

## 2021-06-23 NOTE — Progress Notes (Signed)
?Hematology and Oncology Follow Up Visit ? ?Ariel [name] ?[id number] ?Jul 11, 1955 66 y.o. ?06/23/2021 ? ? ?Principle Diagnosis:  ?Diffuse large cell non-Hodgkin's lymphoma-CNS involvement ? ?Current Therapy:   ?Status post chemotherapy with R-ICE/R-MTV --  s/p cycle #4 ?MATRIX -- s/p cycle #2 -- start on 05/02/2021 ?Neulasta 6 mg subcu post chemotherapy ?    ?Interim History:  Ariel [name] is comes in for an early follow-up.  She had a x-ray of her left hip today.  She has been complaining of some pain in the left hip.  This been going on for a couple weeks.  She takes Tylenol for this. ? ?Unfortunately, the plain x-ray that she had shows that there is avascular necrosis.  I am surprised that she would have this.  She not on steroids.  I do not think the protocols that we have given her are causing the problem. ? ?I think going to have to get an MRI to see what is going on. ? ?It is possible that she may need to have surgery for this. ? ?Her blood counts have recovered nicely.  She did get platelets on Monday.  Her platelet count today is 21,000.  White cell count went up to 5.8.  Her hemoglobin is 9.2. ? ?She has had no problems with cough.  She has had no change in bowel or bladder habits.  There is been no bleeding.  She has had no fever. ? ?Overall, I would have to say that her performance status is still quite good at ECOG 1.   ?   ? ?Medications:  ?Current Outpatient Medications:  ?  acetaminophen (TYLENOL) 500 MG tablet, Take 500-1,000 mg by mouth every 6 (six) hours as needed for mild pain or headache., Disp: , Rfl:  ?  acyclovir (ZOVIRAX) 200 MG capsule, Take 1 capsule (200 mg total) by mouth 2 (two) times daily. (Patient taking differently: Take 200 mg by mouth 2 (two) times daily. continuous), Disp: 60 capsule, Rfl: 3 ?  antiseptic oral rinse (BIOTENE) LIQD, 15 mLs by Mouth Rinse route every 4 (four) hours. (Patient taking differently: 15 mLs by Mouth Rinse route every 4 (four) hours as needed for dry mouth or  mouth pain.), Disp: 500 mL, Rfl: 4 ?  ciprofloxacin (CIPRO) 500 MG tablet, Take 1 tablet (500 mg total) by mouth daily with breakfast., Disp: 15 tablet, Rfl: 4 ?  docusate sodium (COLACE) 100 MG capsule, Take 1 capsule (100 mg total) by mouth 2 (two) times daily as needed for mild constipation. (Patient taking differently: Take 100 mg by mouth at bedtime.), Disp: 10 capsule, Rfl: 0 ?  OLANZapine (ZYPREXA) 5 MG tablet, Take 1 tablet (5 mg total) by mouth daily. (Patient not taking: Reported on 06/20/2021), Disp: 30 tablet, Rfl: 4 ?  ondansetron (ZOFRAN) 8 MG tablet, Take 1 tablet (8 mg total) by mouth every 8 (eight) hours as needed for nausea or vomiting. (Patient not taking: Reported on 06/20/2021), Disp: 30 tablet, Rfl: 3 ?  OVER THE COUNTER MEDICATION, Take 3 capsules by mouth See admin instructions. Balance of Nature VEGETABLE formulation capsules- Take 3 capsules by mouth every morning, Disp: , Rfl:  ?  OVER THE COUNTER MEDICATION, Take 3 capsules by mouth See admin instructions. Balance of Nature FRUIT formulation capsules- Take 3 capsules by mouth every morning, Disp: , Rfl:  ?  VUITY 1.25 % SOLN, Place 1 drop into both eyes in the morning and at bedtime., Disp: , Rfl:  ? ?Allergies:  ?Allergies  ?Allergen  Reactions  ? Decadron [Dexamethasone] Other (See Comments)  ?  Made the patient's legs tired and she could not walk  ? Plavix [Clopidogrel] Hives, Swelling and Other (See Comments)  ?  Lips became swollen ?  ? Prednisone Other (See Comments)  ?  Steroids caused psychological issues   ? Codeine Nausea Only  ? ? ?Past Medical History, Surgical history, Social history, and Family History were reviewed and updated. ? ?Review of Systems: ?Review of Systems  ?Constitutional: Negative.   ?HENT:  Negative.    ?Eyes: Negative.   ?Respiratory: Negative.    ?Cardiovascular: Negative.   ?Gastrointestinal: Negative.   ?Endocrine: Negative.   ?Genitourinary: Negative.    ?Musculoskeletal: Negative.   ?Skin: Negative.    ?Neurological: Negative.   ?Hematological: Negative.   ?Psychiatric/Behavioral: Negative.    ? ?Physical Exam: ? weight is 154 lb (69.9 kg). Her oral temperature is 98.7 ?F (37.1 ?C). Her blood pressure is 143/59 (abnormal) and her pulse is 89. Her respiration is 18 and oxygen saturation is 99%.  ? ?Wt Readings from Last 3 Encounters:  ?06/23/21 154 lb (69.9 kg)  ?06/20/21 153 lb 6.4 oz (69.6 kg)  ?06/10/21 160 lb 0.9 oz (72.6 kg)  ? ? ?Physical Exam ?Vitals reviewed.  ?HENT:  ?   Head: Normocephalic and atraumatic.  ?   Mouth/Throat:  ?   Comments: Oral exam shows multiple sites of gingival bleeding.  She has gauze in her mouth to try to help with the bleeding.  There is no obvious gingival breakdown. ?Eyes:  ?   Pupils: Pupils are equal, round, and reactive to light.  ?Cardiovascular:  ?   Rate and Rhythm: Normal rate and regular rhythm.  ?   Heart sounds: Normal heart sounds.  ?   Comments: Cardiac exam is regular rate and rhythm with no murmurs, rubs or bruits. ?Pulmonary:  ?   Effort: Pulmonary effort is normal.  ?   Breath sounds: Normal breath sounds.  ?Abdominal:  ?   General: Bowel sounds are normal.  ?   Palpations: Abdomen is soft.  ?Musculoskeletal:     ?   General: No tenderness or deformity. Normal range of motion.  ?   Cervical back: Normal range of motion.  ?   Comments: Examination of her left hip shows little bit of tenderness to palpation over the anterior portion of the left hip.  She seems to have decent range of motion.  She has no swelling.  ?Lymphadenopathy:  ?   Cervical: No cervical adenopathy.  ?Skin: ?   General: Skin is warm and dry.  ?   Findings: No erythema or rash.  ?   Comments: Skin exam does show petechia on her lower legs.  ?Neurological:  ?   Mental Status: She is alert and oriented to person, place, and time.  ?   Comments: Neurological exam shows no focal neurological deficits.  All cranial nerves are intact.  She has good strength in upper and lower extremities.   ?Psychiatric:     ?   Behavior: Behavior normal.     ?   Thought Content: Thought content normal.     ?   Judgment: Judgment normal.  ? ? ? ?Lab Results  ?Component Value Date  ? WBC 5.8 06/23/2021  ? HGB 9.2 (L) 06/23/2021  ? HCT 27.2 (L) 06/23/2021  ? MCV 88.9 06/23/2021  ? PLT 21 (L) 06/23/2021  ? ?  Chemistry   ?   ?Component Value Date/Time  ?  NA 138 06/23/2021 1303  ? K 4.1 06/23/2021 1303  ? CL 101 06/23/2021 1303  ? CO2 30 06/23/2021 1303  ? BUN 16 06/23/2021 1303  ? CREATININE 0.81 06/23/2021 1303  ? CREATININE 0.68 11/18/2012 1439  ?    ?Component Value Date/Time  ? CALCIUM 10.3 06/23/2021 1303  ? ALKPHOS 123 06/23/2021 1303  ? AST 16 06/23/2021 1303  ? ALT 21 06/23/2021 1303  ? BILITOT 0.3 06/23/2021 1303  ?  ? ? ?Impression and Plan: ?Ariel [name] is a very charming 66 year old white female.  She initially came to [place] with iron deficiency anemia.  We subsequently found that she had diffuse large cell non-Hodgkin's lymphoma.  It had traveled to her brain. ? ?She has had 4 cycles of treatment.  We basically gave her R-ICE alternating with R-MTV.  She tolerated this very well. ? ?She has had 2 cycles of the MATRIX protocol. ? ?This new problem is unexpected.  Again, we do not have to talk with Orthopedic Surgery.  I will try to speak with Dr. Rhona Raider about this. ? ?We will get an MRI of the left hip.  We will try to get the MRI over the weekend. ? ?If she needs surgery, we can certainly schedule this according to her blood counts. ? ?It be nice to try to give her full treatment for the lymphoma if we can before any count of surgery. ? ?She is due for the MRI of her brain next week.  I will plan to see her back a couple days afterwards.   ? ?Volanda Napoleon, MD ?4/27/20232:49 PM  ?

## 2021-06-23 NOTE — Progress Notes (Signed)
Dr. Marin Olp notified of HGB-9.2, WBC-5.8 and platelet count-21. Hip xray reviewed by Dr. Marin Olp from today. Order received for pt to see Dr. Marin Olp now. Pt currently in lobby waiting for lab results and can wait to see Dr. Marin Olp.  ? ? ?

## 2021-06-24 ENCOUNTER — Telehealth: Payer: Self-pay | Admitting: *Deleted

## 2021-06-24 ENCOUNTER — Other Ambulatory Visit: Payer: Self-pay | Admitting: Hematology & Oncology

## 2021-06-24 ENCOUNTER — Other Ambulatory Visit: Payer: Self-pay | Admitting: *Deleted

## 2021-06-24 MED ORDER — TRAMADOL HCL 50 MG PO TABS: ORAL_TABLET | ORAL | 0 refills | Status: DC

## 2021-06-24 NOTE — Telephone Encounter (Signed)
Faxed referral over to Dr. Jerald Kief office @ [phone] ?

## 2021-06-24 NOTE — Telephone Encounter (Signed)
Message received from patient requesting a medication stronger than Tylenol for pain to her hip.  Call placed back to patient and patient notified per order of Dr. Marin Olp that he will send her in a prescription for Tramadol to [facility].  Pt is appreciative of assistance and has no further questions at this time. ? ? ?

## 2021-06-28 ENCOUNTER — Ambulatory Visit (HOSPITAL_COMMUNITY): Admission: RE | Admit: 2021-06-28 | Payer: Medicare Other | Source: Ambulatory Visit

## 2021-06-29 ENCOUNTER — Ambulatory Visit (HOSPITAL_COMMUNITY): Admission: RE | Admit: 2021-06-29 | Payer: Medicare Other | Source: Ambulatory Visit

## 2021-06-29 ENCOUNTER — Ambulatory Visit (HOSPITAL_COMMUNITY)
Admission: RE | Admit: 2021-06-29 | Discharge: 2021-06-29 | Disposition: A | Discharge status: Home / Self Care | Payer: Medicare Other | Source: Ambulatory Visit | Attending: Hematology & Oncology | Admitting: Hematology & Oncology

## 2021-06-29 DIAGNOSIS — C8589 Other specified types of non-Hodgkin lymphoma, extranodal and solid organ sites: Secondary | ICD-10-CM | POA: Diagnosis not present

## 2021-06-29 DIAGNOSIS — C8510 Unspecified B-cell lymphoma, unspecified site: Secondary | ICD-10-CM | POA: Diagnosis not present

## 2021-06-29 DIAGNOSIS — M25552 Pain in left hip: Secondary | ICD-10-CM | POA: Insufficient documentation

## 2021-06-29 DIAGNOSIS — Z8572 Personal history of non-Hodgkin lymphomas: Secondary | ICD-10-CM | POA: Diagnosis not present

## 2021-06-29 DIAGNOSIS — M25452 Effusion, left hip: Secondary | ICD-10-CM | POA: Diagnosis not present

## 2021-06-29 DIAGNOSIS — M24152 Other articular cartilage disorders, left hip: Secondary | ICD-10-CM | POA: Diagnosis not present

## 2021-06-29 DIAGNOSIS — S76312A Strain of muscle, fascia and tendon of the posterior muscle group at thigh level, left thigh, initial encounter: Secondary | ICD-10-CM | POA: Diagnosis not present

## 2021-06-29 DIAGNOSIS — G9389 Other specified disorders of brain: Secondary | ICD-10-CM | POA: Diagnosis not present

## 2021-06-29 IMAGING — MR MR HEAD WO/W CM
13 series · 48 of 48 positions shown · IV contrast (gadavist)
Comparison: [DATE]

CLINICAL DATA: CNS lymphoma after chemotherapy

EXAM:
MRI HEAD WITHOUT AND WITH CONTRAST
TECHNIQUE: Multiplanar, multiecho pulse sequences of the brain and surrounding
structures were obtained without and with intravenous contrast.
CONTRAST:  6mL GADAVIST GADOBUTROL 1 MMOL/ML IV SOLN

[Series 5: DWI · axial · 3.0mm · 1.36mm/px · z∈[-32,+109]mm · 6 of 96 slices shown (1 of 2)]
[im 1/96]
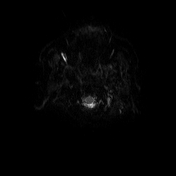
[im 20/96]
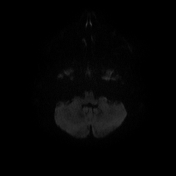
[im 39/96]
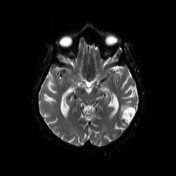
[im 58/96]
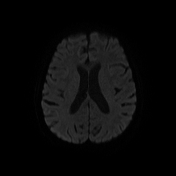
[im 77/96]
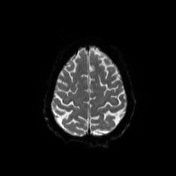
[im 96/96]
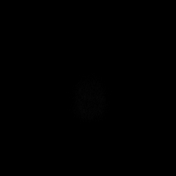

[Series 6: DWI · axial · 3.0mm · 1.36mm/px · z∈[-32,+109]mm · 3 of 48 slices shown (2 of 2)]
[im 1/48]
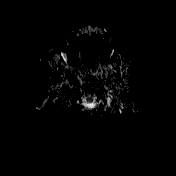
[im 24/48]
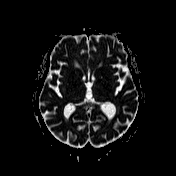
[im 48/48]
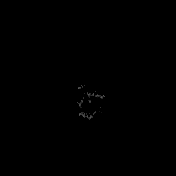

[Series 7: T1 · sagittal · 5.0mm · 0.75mm/px · 2 of 24 slices shown (1 of 2)]
[im 1/24]
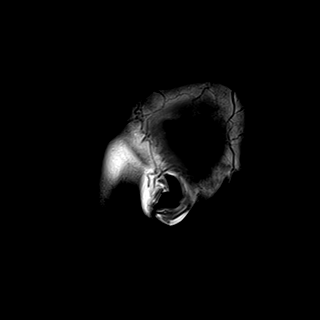
[im 24/24]
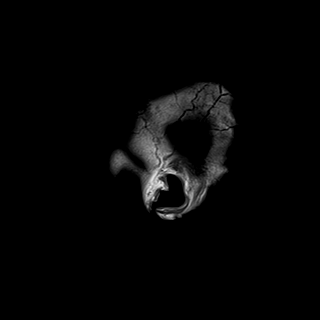

[Series 8: T2 · axial · 5.0mm · 0.62mm/px · z∈[-41,+108]mm · 2 of 24 slices shown (1 of 2)]
[im 1/24]
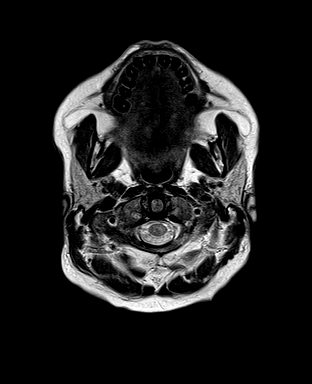
[im 24/24]
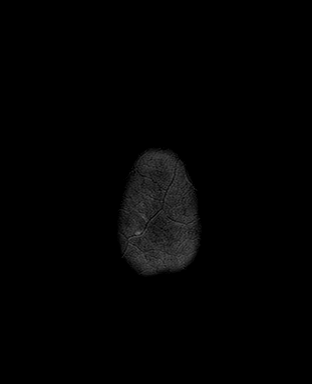

[Series 9: swi_images · axial · 3.0mm · 0.75mm/px · z∈[-37,+104]mm · 3 of 48 slices shown]
[im 1/48]
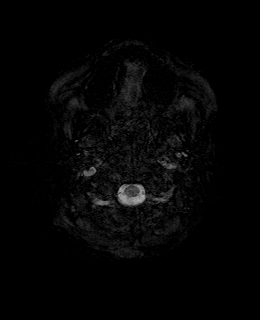
[im 24/48]
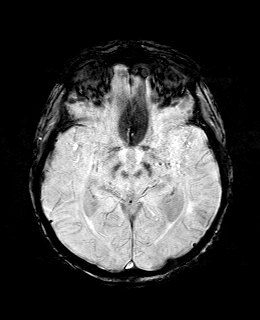
[im 48/48]
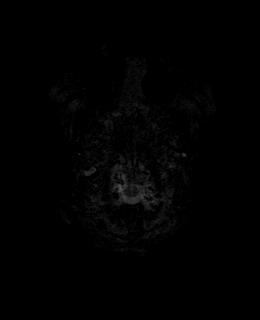

[Series 11: FLAIR · axial · 3.0mm · 0.75mm/px · z∈[-43,+110]mm · 3 of 52 slices shown]
[im 1/52]
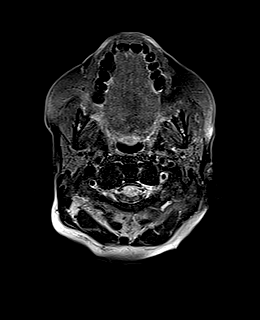
[im 26/52]
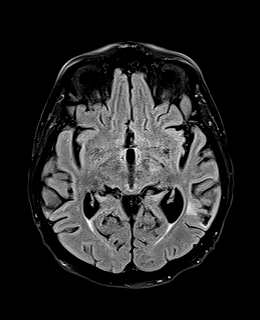
[im 52/52]
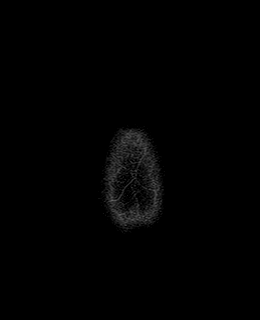

[Series 12: T1 · axial · 1.0mm · 0.94mm/px · z∈[-35,+108]mm · 9 of 144 slices shown (2 of 2)]
[im 1/144]
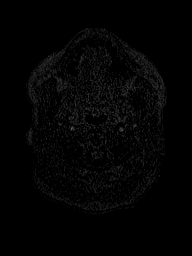
[im 18/144]
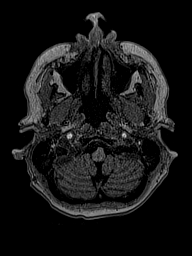
[im 36/144]
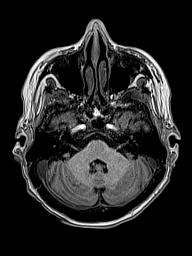
[im 54/144]
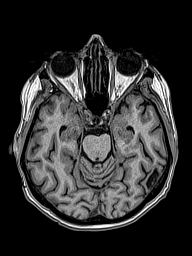
[im 72/144]
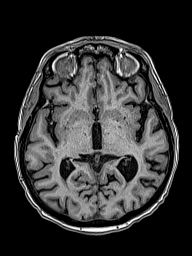
[im 90/144]
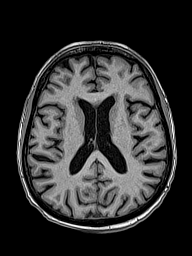
[im 108/144]
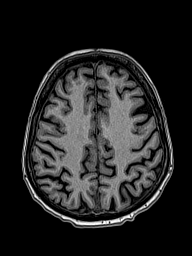
[im 126/144]
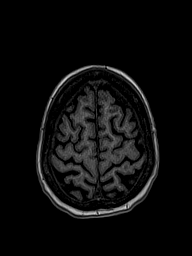
[im 144/144]
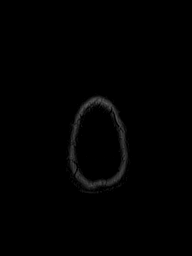

[Series 13: cor dwi_tracew · coronal · 5.0mm · 1.53mm/px · 3 of 48 slices shown]
[im 1/48]
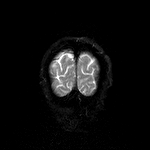
[im 24/48]
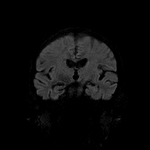
[im 48/48]
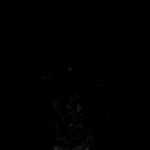

[Series 14: cor dwi_adc · coronal · 5.0mm · 1.53mm/px · 2 of 24 slices shown]
[im 1/24]
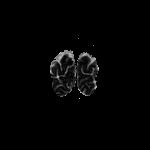
[im 24/24]
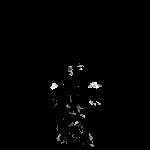

[Series 15: T2 · coronal · 5.0mm · 0.57mm/px · 2 of 28 slices shown (2 of 2)]
[im 1/28]
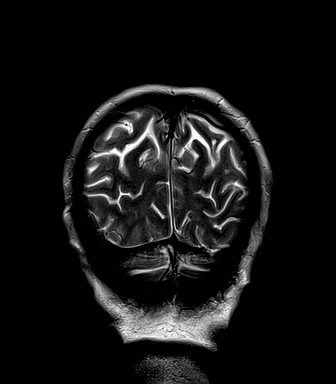
[im 28/28]
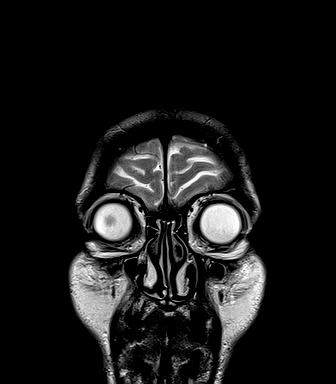

[Series 20: T1 post-contrast · axial · 1.0mm · 0.94mm/px · z∈[-43,+100]mm · 9 of 144 slices shown (1 of 3)]
[im 1/144]
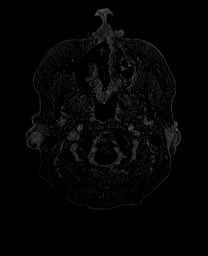
[im 18/144]
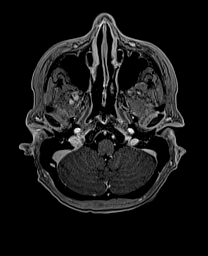
[im 36/144]
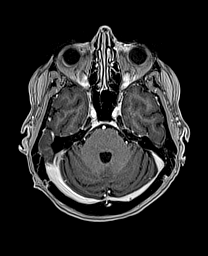
[im 54/144]
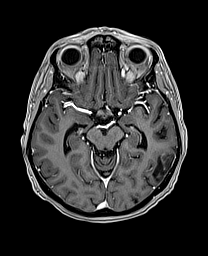
[im 72/144]
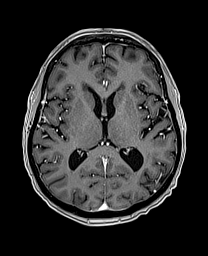
[im 90/144]
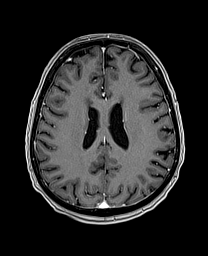
[im 108/144]
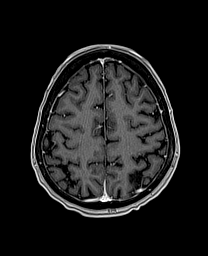
[im 126/144]
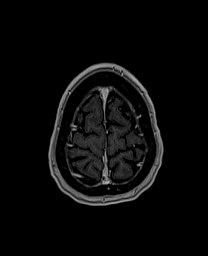
[im 144/144]
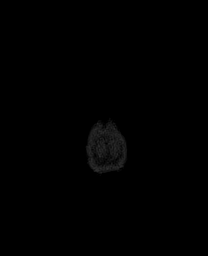

[Series 21: T1 post-contrast · coronal · 5.0mm · 0.43mm/px · 2 of 30 slices shown (2 of 3)]
[im 1/30]
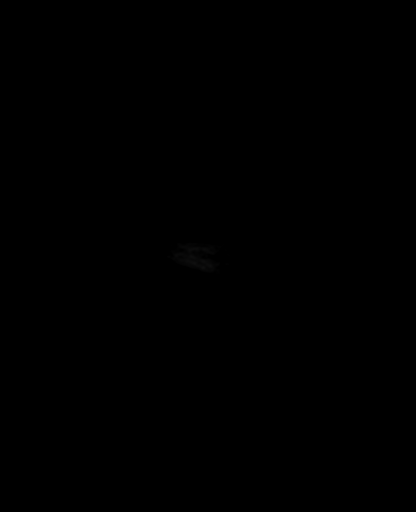
[im 30/30]
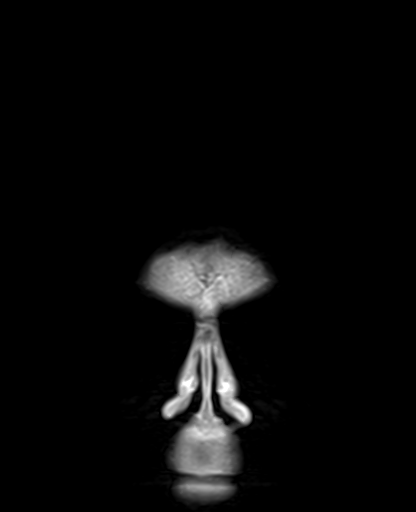

[Series 22: T1 post-contrast · sagittal · 5.0mm · 0.75mm/px · 2 of 24 slices shown (3 of 3)]
[im 1/24]
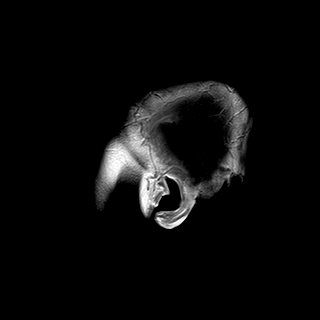
[im 24/24]
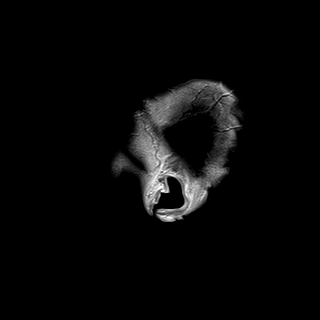

[48 of 48 positions shown; findings below may reference images not displayed]

FINDINGS: Brain: Significant positive response to therapy. No residual
abnormal enhancement in the brain. Subtle FLAIR hyperintensity in
the upper left cerebral peduncle and subcortical high and anterior
left frontal lobe at sites of prior enhancing disease.
Encephalomalacia in the posterior left temporal lobe which is
stable. No acute infarct, hemorrhage, hydrocephalus, or collection.

Vascular: Major flow voids and vascular enhancements are preserved

Skull and upper cervical spine: No marrow lesion.

Sinuses/Orbits: Negative
IMPRESSION: Significant positive response to therapy. No residual enhancing
disease.

## 2021-06-29 IMAGING — MR MR HIP*L* WO/W CM
8 series · 39 of 40 positions shown · IV contrast (gadavist)
Comparison: X-ray [DATE], CT [DATE]

CLINICAL DATA: Left hip pain.  History of B-cell lymphoma

EXAM:
MRI OF THE LEFT HIP WITHOUT AND WITH CONTRAST
TECHNIQUE: Multiplanar, multisequence MR imaging was performed both before and
after administration of intravenous contrast.
CONTRAST:  6mL GADAVIST GADOBUTROL 1 MMOL/ML IV SOLN

[Series 8: T1 · coronal · left · 3.0mm · 0.78mm/px · 5 of 32 slices shown]
[im 1/32]
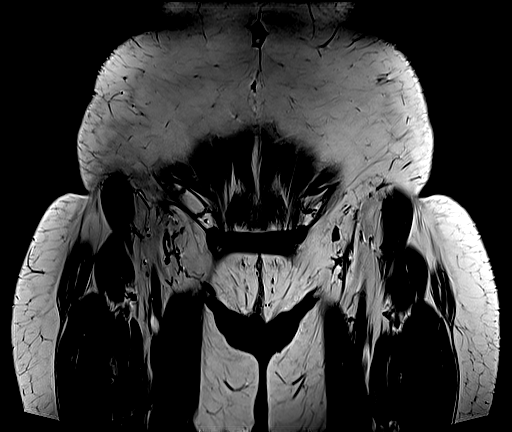
[im 8/32]
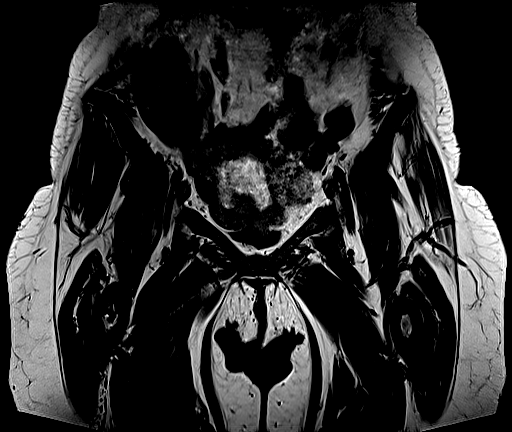
[im 16/32]
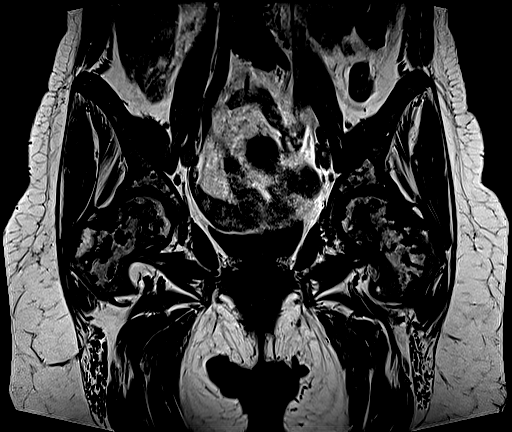
[im 24/32]
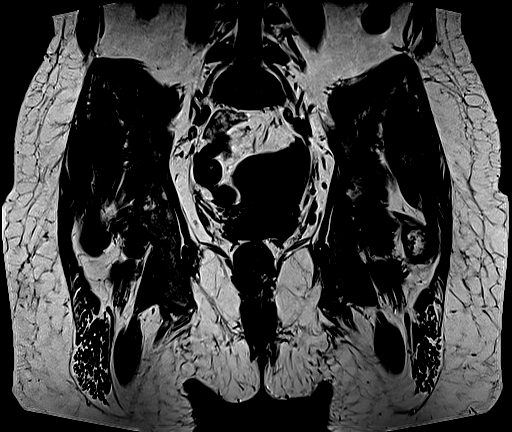
[im 32/32]
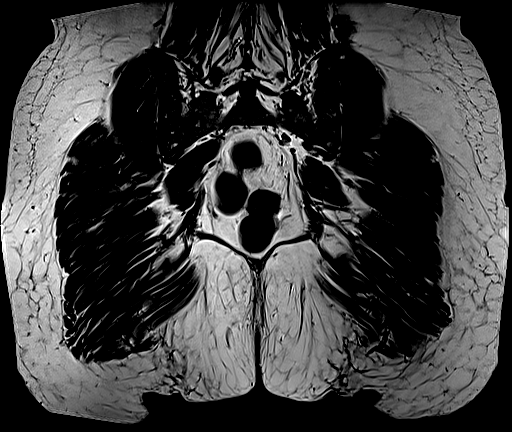

[Series 9: T2 fat-sat · coronal · left · 3.0mm · 1.25mm/px · 5 of 32 slices shown (1 of 2)]
[im 1/32]
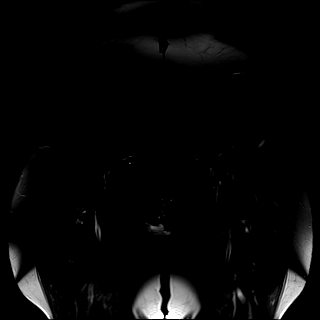
[im 8/32]
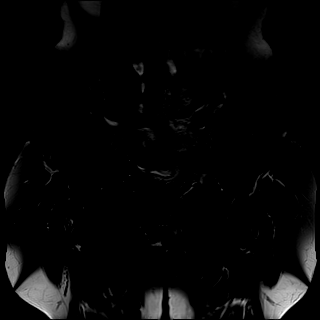
[im 16/32]
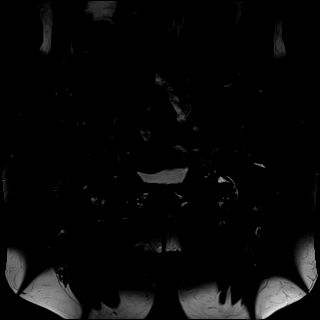
[im 24/32]
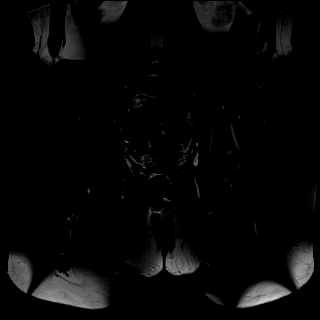
[im 32/32]
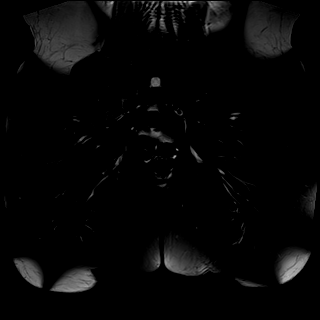

[Series 10: T2 fat-sat · axial · left · 4.0mm · 1.12mm/px · z∈[-112,+112]mm · 7 of 46 slices shown (2 of 2)]
[im 1/46]
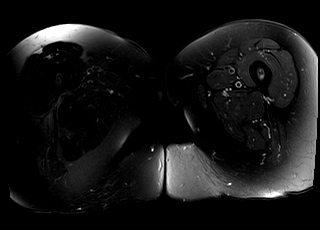
[im 8/46]
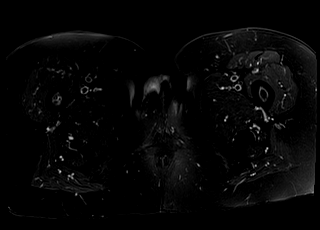
[im 16/46]
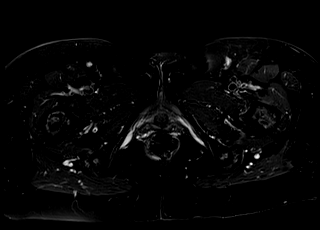
[im 23/46]
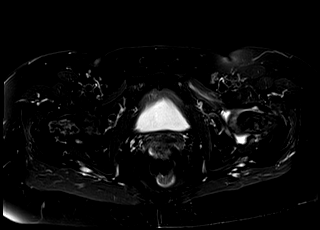
[im 31/46]
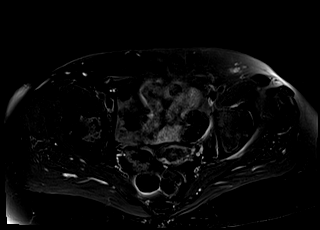
[im 38/46]
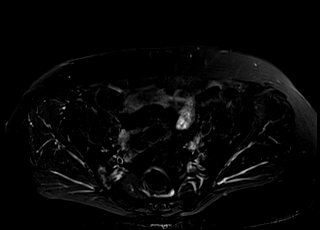
[im 46/46]
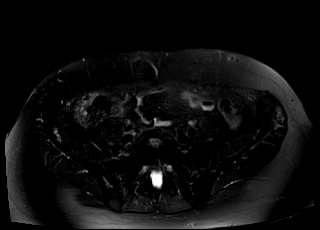

[Series 11: PD fat-sat · coronal · left · 4.0mm · 0.56mm/px · 3 of 20 slices shown (1 of 2)]
[im 1/20]
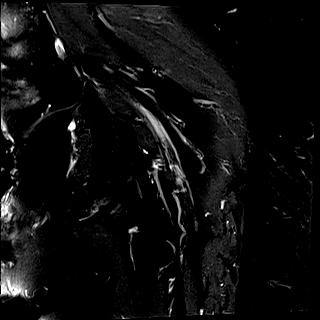
[im 10/20]
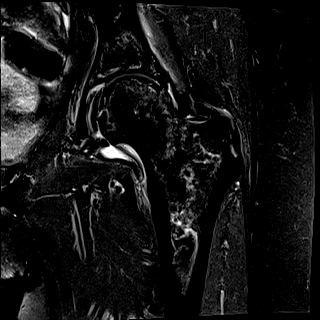
[im 20/20]
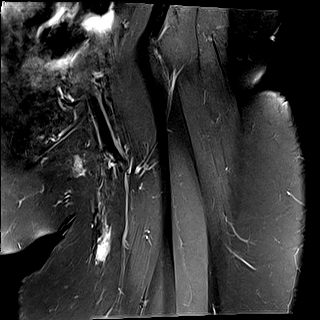

[Series 12: PD fat-sat · sagittal · left · 4.0mm · 0.56mm/px · 5 of 30 slices shown (2 of 2)]
[im 1/30]
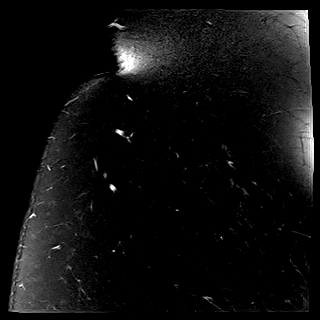
[im 8/30]
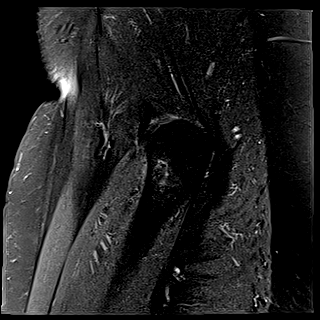
[im 15/30]
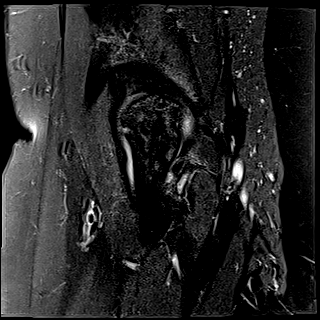
[im 22/30]
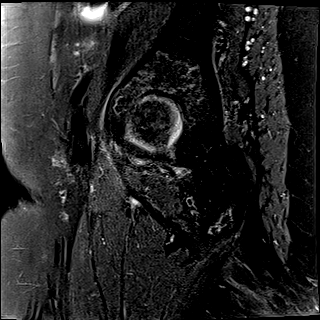
[im 30/30]
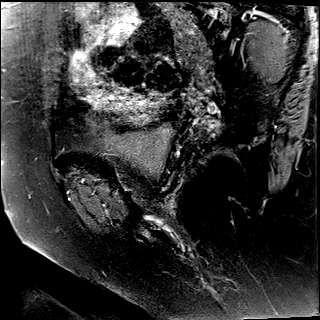

[Series 13: T1 fat-sat · axial · non-contrast · left · 4.0mm · 0.86mm/px · z∈[-78,+107]mm · 6 of 38 slices shown (1 of 2)]
[im 1/38]
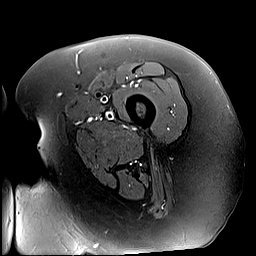
[im 8/38]
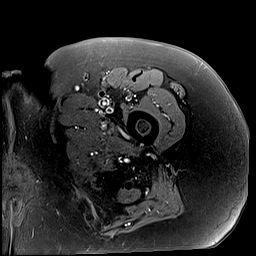
[im 15/38]
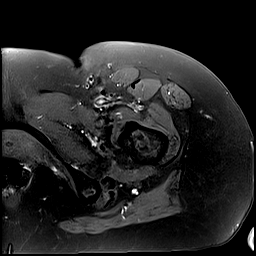
[im 23/38]
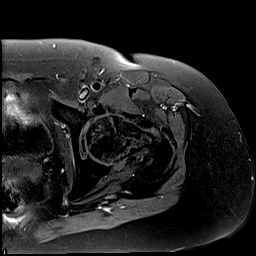
[im 30/38]
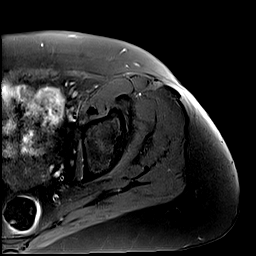
[im 38/38]
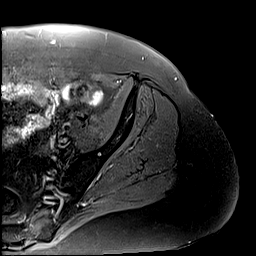

[Series 14: T1 fat-sat post-contrast · axial · left · 4.0mm · 0.86mm/px · z∈[-78,+107]mm · 6 of 38 slices shown]
[im 1/38]
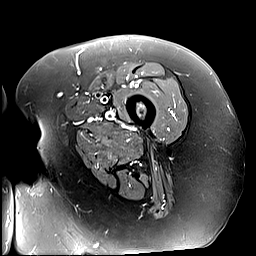
[im 8/38]
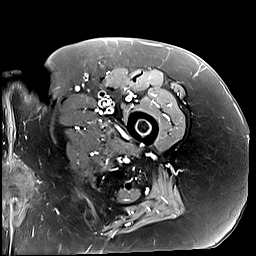
[im 15/38]
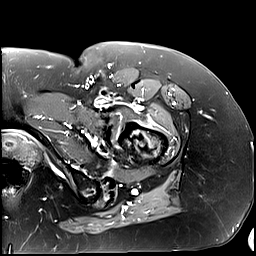
[im 23/38]
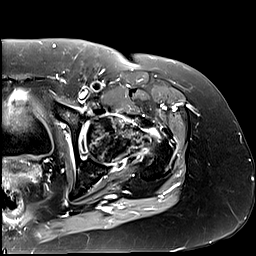
[im 30/38]
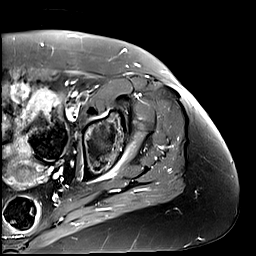
[im 38/38]
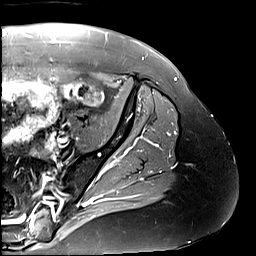

[Series 15: T1 fat-sat · coronal · left · 4.0mm · 0.35mm/px · 2 of 20 slices shown (2 of 2)]
[im 1/20]
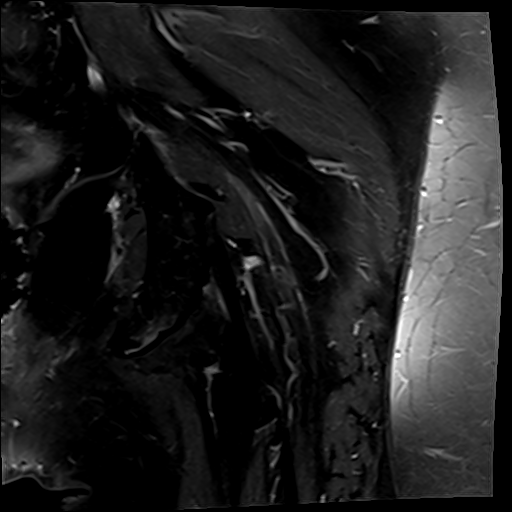
[im 10/20]
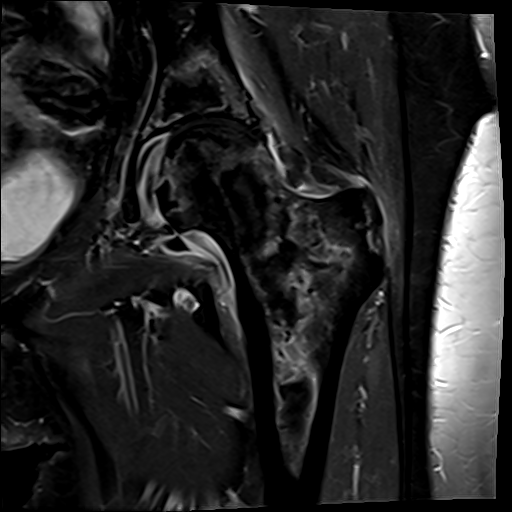

[39 of 40 positions shown; findings below may reference images not displayed]

FINDINGS: Bones: Extensive bone infarctions throughout the bilateral femurs
and within the pelvis. Involvement of the subchondral aspect of the
left femoral head with slight flattening of the overlying articular
surface compatible with a component of subarticular collapse. Areas
of bone infarction demonstrate peripheral enhancement on
postcontrast sequences. There is a background of diffusely decreased
T1 bone marrow signal. No destructive bone lesion. No acute
fracture. No pelvic diastasis. No hip dislocation.

Articular cartilage and labrum

Articular cartilage:  Chondral thinning of the left hip joint.

Labrum:  Anterosuperior labrum is degenerated.

Joint or bursal effusion

Joint effusion:  Small left hip joint effusion.  Moderate

Bursae: No abnormal bursal fluid collection.

Muscles and tendons

Muscles and tendons: Tendinosis of the bilateral hamstring tendon
origins with low-grade interstitial tearing. Mild tendinosis with
low-grade insertional tear of the left gluteus minimus tendon. The
iliopsoas, rectus femoris, and adductor tendons appear intact
without tear or significant tendinosis. Normal muscle bulk and
signal intensity without edema, atrophy, or fatty infiltration.

Other findings

Miscellaneous: No soft tissue edema or fluid collection. No inguinal
lymphadenopathy.
IMPRESSION: 1. Extensive bone infarctions throughout the bilateral femurs and
within the pelvis. Involvement of the subchondral aspect of the left
femoral head with slight flattening of the overlying articular
surface compatible with a component of subarticular collapse.
2. Background of diffusely decreased T1 bone marrow signal, likely
related to known lymphoma and post-treatment changes.
3. Small left hip joint effusion.
4. Tendinosis of the bilateral hamstring tendon origins with
low-grade interstitial tearing.
5. Mild tendinosis with low-grade insertional tear of the left
gluteus minimus tendon.

## 2021-06-29 MED ORDER — GADOBUTROL 1 MMOL/ML IV SOLN
6.0000 mL | Freq: Once | INTRAVENOUS | Status: AC | PRN
  Administered 2021-06-29: 6 mL via INTRAVENOUS

## 2021-06-30 ENCOUNTER — Encounter: Payer: Self-pay | Admitting: *Deleted

## 2021-06-30 ENCOUNTER — Inpatient Hospital Stay: Payer: Medicare Other | Attending: Internal Medicine

## 2021-06-30 ENCOUNTER — Other Ambulatory Visit: Payer: Self-pay

## 2021-06-30 ENCOUNTER — Other Ambulatory Visit: Payer: Medicare Other

## 2021-06-30 ENCOUNTER — Ambulatory Visit: Payer: Medicare Other | Admitting: Hematology & Oncology

## 2021-06-30 ENCOUNTER — Inpatient Hospital Stay (HOSPITAL_BASED_OUTPATIENT_CLINIC_OR_DEPARTMENT_OTHER): Payer: Medicare Other | Admitting: Hematology & Oncology

## 2021-06-30 ENCOUNTER — Encounter: Payer: Self-pay | Admitting: Hematology & Oncology

## 2021-06-30 ENCOUNTER — Inpatient Hospital Stay: Payer: Medicare Other

## 2021-06-30 VITALS — BP 134/63 | HR 87 | Temp 97.3°F | Resp 18 | Wt 154.0 lb

## 2021-06-30 DIAGNOSIS — Z9221 Personal history of antineoplastic chemotherapy: Secondary | ICD-10-CM | POA: Diagnosis not present

## 2021-06-30 DIAGNOSIS — C8510 Unspecified B-cell lymphoma, unspecified site: Secondary | ICD-10-CM | POA: Diagnosis not present

## 2021-06-30 DIAGNOSIS — D509 Iron deficiency anemia, unspecified: Secondary | ICD-10-CM | POA: Insufficient documentation

## 2021-06-30 DIAGNOSIS — C8330 Diffuse large B-cell lymphoma, unspecified site: Secondary | ICD-10-CM | POA: Diagnosis not present

## 2021-06-30 DIAGNOSIS — Z79899 Other long term (current) drug therapy: Secondary | ICD-10-CM | POA: Insufficient documentation

## 2021-06-30 LAB — CBC WITH DIFFERENTIAL (CANCER CENTER ONLY)
Abs Immature Granulocytes: 0.03 10*3/uL (ref 0.00–0.07)
Basophils Absolute: 0 10*3/uL (ref 0.0–0.1)
Basophils Relative: 1 %
Eosinophils Absolute: 0 10*3/uL (ref 0.0–0.5)
Eosinophils Relative: 0 %
HCT: 26.8 % — ABNORMAL LOW (ref 36.0–46.0)
Hemoglobin: 9 g/dL — ABNORMAL LOW (ref 12.0–15.0)
Immature Granulocytes: 1 %
Lymphocytes Relative: 7 %
Lymphs Abs: 0.3 10*3/uL — ABNORMAL LOW (ref 0.7–4.0)
MCH: 30 pg (ref 26.0–34.0)
MCHC: 33.6 g/dL (ref 30.0–36.0)
MCV: 89.3 fL (ref 80.0–100.0)
Monocytes Absolute: 0.7 10*3/uL (ref 0.1–1.0)
Monocytes Relative: 17 %
Neutro Abs: 3.2 10*3/uL (ref 1.7–7.7)
Neutrophils Relative %: 74 %
Platelet Count: 79 10*3/uL — ABNORMAL LOW (ref 150–400)
RBC: 3 MIL/uL — ABNORMAL LOW (ref 3.87–5.11)
RDW: 15.2 % (ref 11.5–15.5)
WBC Count: 4.3 10*3/uL (ref 4.0–10.5)
nRBC: 0 % (ref 0.0–0.2)

## 2021-06-30 LAB — CMP (CANCER CENTER ONLY)
ALT: 21 U/L (ref 0–44)
AST: 22 U/L (ref 15–41)
Albumin: 4.3 g/dL (ref 3.5–5.0)
Alkaline Phosphatase: 105 U/L (ref 38–126)
Anion gap: 7 (ref 5–15)
BUN: 13 mg/dL (ref 8–23)
CO2: 32 mmol/L (ref 22–32)
Calcium: 10 mg/dL (ref 8.9–10.3)
Chloride: 99 mmol/L (ref 98–111)
Creatinine: 0.8 mg/dL (ref 0.44–1.00)
GFR, Estimated: >60 mL/min (ref >60)
Glucose, Bld: 98 mg/dL (ref 70–99)
Potassium: 4.4 mmol/L (ref 3.5–5.1)
Sodium: 138 mmol/L (ref 135–145)
Total Bilirubin: 0.4 mg/dL (ref 0.3–1.2)
Total Protein: 6.7 g/dL (ref 6.5–8.1)

## 2021-06-30 LAB — SAMPLE TO BLOOD BANK

## 2021-06-30 LAB — LACTATE DEHYDROGENASE: LDH: 195 U/L — ABNORMAL HIGH (ref 98–192)

## 2021-06-30 LAB — MAGNESIUM: Magnesium: 2 mg/dL (ref 1.7–2.4)

## 2021-06-30 NOTE — Progress Notes (Signed)
?Hematology and Oncology Follow Up Visit ? ?Ariel [name] ?[id number] ?1955-11-19 66 y.o. ?06/30/2021 ? ? ?Principle Diagnosis:  ?Diffuse large cell non-Hodgkin's lymphoma-CNS involvement ? ?Current Therapy:   ?Status post chemotherapy with R-ICE/R-MTV --  s/p cycle #4 ?MATRIX -- s/p cycle #2 -- start on 05/02/2021 ?Neulasta 6 mg subcu post chemotherapy ?    ?Interim History:  [name] is comes in for follow-up.  She had an MRI of the brain yesterday.  The MRI of the brain did not show any evidence of residual lymphoma.  As such, her MATRIX protocol has been working very nicely. ? ?She had an MRI of the left hip yesterday.  Unfortunately she does have significant damage in the left hip.  She has avascular necrosis.  She has flattening of the femoral head. ? ?She sees Dr. Rhona Raider of Orthopedic Surgery tomorrow.  I am sure that he is going need to operate on this and replaced the hip.  Unfortunately, we are not can be able to do this until after she completes her chemotherapy protocol. ? ?She is eating well.  She is getting around okay.  She is on tramadol to help with some pain. ? ?She has had no nausea or vomiting.  She has had no mouth sores.  She has had no rashes.  She has had no headache. ? ?Her eyes seem to be doing fairly well. ? ?Currently, I would have said that her performance status is ECOG 1.    ? ?Medications:  ?Current Outpatient Medications:  ?  acetaminophen (TYLENOL) 500 MG tablet, Take 500-1,000 mg by mouth every 6 (six) hours as needed for mild pain or headache., Disp: , Rfl:  ?  acyclovir (ZOVIRAX) 200 MG capsule, Take 1 capsule (200 mg total) by mouth 2 (two) times daily. (Patient taking differently: Take 200 mg by mouth 2 (two) times daily. continuous), Disp: 60 capsule, Rfl: 3 ?  antiseptic oral rinse (BIOTENE) LIQD, 15 mLs by Mouth Rinse route every 4 (four) hours. (Patient taking differently: 15 mLs by Mouth Rinse route every 4 (four) hours as needed for dry mouth or mouth pain.), Disp: 500  mL, Rfl: 4 ?  ciprofloxacin (CIPRO) 500 MG tablet, Take 1 tablet (500 mg total) by mouth daily with breakfast., Disp: 15 tablet, Rfl: 4 ?  docusate sodium (COLACE) 100 MG capsule, Take 1 capsule (100 mg total) by mouth 2 (two) times daily as needed for mild constipation. (Patient taking differently: Take 100 mg by mouth at bedtime.), Disp: 10 capsule, Rfl: 0 ?  OLANZapine (ZYPREXA) 5 MG tablet, Take 1 tablet (5 mg total) by mouth daily. (Patient not taking: Reported on 06/20/2021), Disp: 30 tablet, Rfl: 4 ?  ondansetron (ZOFRAN) 8 MG tablet, Take 1 tablet (8 mg total) by mouth every 8 (eight) hours as needed for nausea or vomiting. (Patient not taking: Reported on 06/20/2021), Disp: 30 tablet, Rfl: 3 ?  OVER THE COUNTER MEDICATION, Take 3 capsules by mouth See admin instructions. Balance of Nature VEGETABLE formulation capsules- Take 3 capsules by mouth every morning, Disp: , Rfl:  ?  OVER THE COUNTER MEDICATION, Take 3 capsules by mouth See admin instructions. Balance of Nature FRUIT formulation capsules- Take 3 capsules by mouth every morning, Disp: , Rfl:  ?  traMADol (ULTRAM) 50 MG tablet, Take one (50 mg) to two (100 mg) tablets every six hours as needed for pain, Disp: 60 tablet, Rfl: 0 ?  VUITY 1.25 % SOLN, Place 1 drop into both eyes in the morning  and at bedtime., Disp: , Rfl:  ? ?Allergies:  ?Allergies  ?Allergen Reactions  ? Decadron [Dexamethasone] Other (See Comments)  ?  Made the patient's legs tired and she could not walk  ? Plavix [Clopidogrel] Hives, Swelling and Other (See Comments)  ?  Lips became swollen ?  ? Prednisone Other (See Comments)  ?  Steroids caused psychological issues   ? Codeine Nausea Only  ? ? ?Past Medical History, Surgical history, Social history, and Family History were reviewed and updated. ? ?Review of Systems: ?Review of Systems  ?Constitutional: Negative.   ?HENT:  Negative.    ?Eyes: Negative.   ?Respiratory: Negative.    ?Cardiovascular: Negative.   ?Gastrointestinal:  Negative.   ?Endocrine: Negative.   ?Genitourinary: Negative.    ?Musculoskeletal: Negative.   ?Skin: Negative.   ?Neurological: Negative.   ?Hematological: Negative.   ?Psychiatric/Behavioral: Negative.    ? ?Physical Exam: ? weight is 154 lb (69.9 kg). Her oral temperature is 97.3 ?F (36.3 ?C) (abnormal). Her blood pressure is 134/63 and her pulse is 87. Her respiration is 18 and oxygen saturation is 100%.  ? ?Wt Readings from Last 3 Encounters:  ?06/30/21 154 lb (69.9 kg)  ?06/23/21 154 lb (69.9 kg)  ?06/20/21 153 lb 6.4 oz (69.6 kg)  ? ? ?Physical Exam ?Vitals reviewed.  ?HENT:  ?   Head: Normocephalic and atraumatic.  ?   Mouth/Throat:  ?   Comments: Oral exam shows multiple sites of gingival bleeding.  She has gauze in her mouth to try to help with the bleeding.  There is no obvious gingival breakdown. ?Eyes:  ?   Pupils: Pupils are equal, round, and reactive to light.  ?Cardiovascular:  ?   Rate and Rhythm: Normal rate and regular rhythm.  ?   Heart sounds: Normal heart sounds.  ?   Comments: Cardiac exam is regular rate and rhythm with no murmurs, rubs or bruits. ?Pulmonary:  ?   Effort: Pulmonary effort is normal.  ?   Breath sounds: Normal breath sounds.  ?Abdominal:  ?   General: Bowel sounds are normal.  ?   Palpations: Abdomen is soft.  ?Musculoskeletal:     ?   General: No tenderness or deformity. Normal range of motion.  ?   Cervical back: Normal range of motion.  ?   Comments: Examination of her left hip shows little bit of tenderness to palpation over the anterior portion of the left hip.  She seems to have decent range of motion.  She has no swelling.  ?Lymphadenopathy:  ?   Cervical: No cervical adenopathy.  ?Skin: ?   General: Skin is warm and dry.  ?   Findings: No erythema or rash.  ?   Comments: Skin exam does show petechia on her lower legs.  ?Neurological:  ?   Mental Status: She is alert and oriented to person, place, and time.  ?   Comments: Neurological exam shows no focal neurological  deficits.  All cranial nerves are intact.  She has good strength in upper and lower extremities.  ?Psychiatric:     ?   Behavior: Behavior normal.     ?   Thought Content: Thought content normal.     ?   Judgment: Judgment normal.  ? ? ? ?Lab Results  ?Component Value Date  ? WBC 4.3 06/30/2021  ? HGB 9.0 (L) 06/30/2021  ? HCT 26.8 (L) 06/30/2021  ? MCV 89.3 06/30/2021  ? PLT 79 (L) 06/30/2021  ? ?  Chemistry   ?   ?Component Value Date/Time  ? NA 138 06/23/2021 1303  ? K 4.1 06/23/2021 1303  ? CL 101 06/23/2021 1303  ? CO2 30 06/23/2021 1303  ? BUN 16 06/23/2021 1303  ? CREATININE 0.81 06/23/2021 1303  ? CREATININE 0.68 11/18/2012 1439  ?    ?Component Value Date/Time  ? CALCIUM 10.3 06/23/2021 1303  ? ALKPHOS 123 06/23/2021 1303  ? AST 16 06/23/2021 1303  ? ALT 21 06/23/2021 1303  ? BILITOT 0.3 06/23/2021 1303  ?  ? ? ?Impression and Plan: ?Ms. Ketner is a very charming 66 year old white female.  She initially came to [place] with iron deficiency anemia.  We subsequently found that she had diffuse large cell non-Hodgkin's lymphoma.  It had traveled to her brain. ? ?She has had 4 cycles of treatment.  We basically gave her R-ICE alternating with R-MTV.  She tolerated this very well. ? ?She has had 2 cycles of the MATRIX protocol. ? ?We will get her admitted to [facility] long for her third cycle of Matrix.  I think that we need a total of 4 cycles. ? ?Again we will going to have to see how the left hip can be taken care of.  I cannot see her having surgery until after her protocol is completed. ? ?Maybe, Dr. Rhona Raider will be able to do an injection into that area to try to help the discomfort. ? ?I would like to get Ms. Rohrman back probably in about 10 days or so.  She will be admitted on May 8.  If all goes well, she will go home on either May 12 or May 13.  She will then come back to the office on May 14 for her labs and Neulasta injection. ? ? ?Volanda Napoleon, MD ?5/4/20231:32 PM  ?

## 2021-06-30 NOTE — Progress Notes (Signed)
Patient needs to be admitted Monday for her next cycle of MATRIX.  ? ?Called and spoke to Antoine in bed control, requesting a direct admission to [place] for 07/04/2021. Notification also emailed to the IP Chemotherapy email group notifying them of planned admission.  ? ?Oncology Nurse Navigator Documentation ? ? ?  06/30/2021  ?  2:00 PM  ?Oncology Nurse Navigator Flowsheets  ?Navigator Follow Up Date: 07/04/2021  ?Navigator Follow Up Reason: Other:  ?Financial planner  ?Navigator Encounter Type Appt/Treatment Plan Review;Telephone;Scan Review  ?Patient Visit Type MedOnc  ?Treatment Phase Active Tx  ?Barriers/Navigation Needs Coordination of Care;Education  ?Interventions Coordination of Care  ?Acuity Level 2-Minimal Needs (1-2 Barriers Identified)  ?Coordination of Care Other  ?Support Groups/Services Friends and Family  ?Time Spent with Patient 30  ?  ?

## 2021-07-01 DIAGNOSIS — M25552 Pain in left hip: Secondary | ICD-10-CM | POA: Diagnosis not present

## 2021-07-02 ENCOUNTER — Other Ambulatory Visit: Payer: Self-pay | Admitting: Hematology & Oncology

## 2021-07-02 DIAGNOSIS — C8510 Unspecified B-cell lymphoma, unspecified site: Secondary | ICD-10-CM

## 2021-07-04 ENCOUNTER — Other Ambulatory Visit: Payer: Self-pay

## 2021-07-04 ENCOUNTER — Encounter: Payer: Self-pay | Admitting: *Deleted

## 2021-07-04 ENCOUNTER — Encounter (HOSPITAL_COMMUNITY): Payer: Self-pay | Admitting: Hematology & Oncology

## 2021-07-04 ENCOUNTER — Inpatient Hospital Stay (HOSPITAL_COMMUNITY)
Admission: AD | Admit: 2021-07-04 | Discharge: 2021-07-09 | DRG: 842 | Disposition: A | Discharge status: Home / Self Care | Payer: Medicare Other | Source: Ambulatory Visit | Attending: Hematology & Oncology | Admitting: Hematology & Oncology

## 2021-07-04 DIAGNOSIS — Z811 Family history of alcohol abuse and dependence: Secondary | ICD-10-CM | POA: Diagnosis not present

## 2021-07-04 DIAGNOSIS — D509 Iron deficiency anemia, unspecified: Secondary | ICD-10-CM | POA: Diagnosis present

## 2021-07-04 DIAGNOSIS — Z888 Allergy status to other drugs, medicaments and biological substances status: Secondary | ICD-10-CM

## 2021-07-04 DIAGNOSIS — C8334 Diffuse large B-cell lymphoma, lymph nodes of axilla and upper limb: Secondary | ICD-10-CM

## 2021-07-04 DIAGNOSIS — Z833 Family history of diabetes mellitus: Secondary | ICD-10-CM

## 2021-07-04 DIAGNOSIS — Z8673 Personal history of transient ischemic attack (TIA), and cerebral infarction without residual deficits: Secondary | ICD-10-CM | POA: Diagnosis not present

## 2021-07-04 DIAGNOSIS — D696 Thrombocytopenia, unspecified: Secondary | ICD-10-CM | POA: Diagnosis not present

## 2021-07-04 DIAGNOSIS — Z8249 Family history of ischemic heart disease and other diseases of the circulatory system: Secondary | ICD-10-CM | POA: Diagnosis not present

## 2021-07-04 DIAGNOSIS — Z825 Family history of asthma and other chronic lower respiratory diseases: Secondary | ICD-10-CM | POA: Diagnosis not present

## 2021-07-04 DIAGNOSIS — C8510 Unspecified B-cell lymphoma, unspecified site: Principal | ICD-10-CM

## 2021-07-04 DIAGNOSIS — C8332 Diffuse large B-cell lymphoma, intrathoracic lymph nodes: Secondary | ICD-10-CM

## 2021-07-04 DIAGNOSIS — C8330 Diffuse large B-cell lymphoma, unspecified site: Secondary | ICD-10-CM | POA: Diagnosis not present

## 2021-07-04 DIAGNOSIS — Z5111 Encounter for antineoplastic chemotherapy: Secondary | ICD-10-CM

## 2021-07-04 DIAGNOSIS — R112 Nausea with vomiting, unspecified: Secondary | ICD-10-CM

## 2021-07-04 DIAGNOSIS — Z885 Allergy status to narcotic agent status: Secondary | ICD-10-CM | POA: Diagnosis not present

## 2021-07-04 DIAGNOSIS — Z809 Family history of malignant neoplasm, unspecified: Secondary | ICD-10-CM | POA: Diagnosis not present

## 2021-07-04 DIAGNOSIS — Z85820 Personal history of malignant melanoma of skin: Secondary | ICD-10-CM

## 2021-07-04 DIAGNOSIS — C8333 Diffuse large B-cell lymphoma, intra-abdominal lymph nodes: Secondary | ICD-10-CM

## 2021-07-04 DIAGNOSIS — E7800 Pure hypercholesterolemia, unspecified: Secondary | ICD-10-CM | POA: Diagnosis not present

## 2021-07-04 DIAGNOSIS — D72819 Decreased white blood cell count, unspecified: Secondary | ICD-10-CM | POA: Diagnosis present

## 2021-07-04 LAB — COMPREHENSIVE METABOLIC PANEL
ALT: 30 U/L (ref 0–44)
AST: 28 U/L (ref 15–41)
Albumin: 4 g/dL (ref 3.5–5.0)
Alkaline Phosphatase: 78 U/L (ref 38–126)
Anion gap: 8 (ref 5–15)
BUN: 20 mg/dL (ref 8–23)
CO2: 28 mmol/L (ref 22–32)
Calcium: 9.3 mg/dL (ref 8.9–10.3)
Chloride: 101 mmol/L (ref 98–111)
Creatinine, Ser: 0.81 mg/dL (ref 0.44–1.00)
GFR, Estimated: >60 mL/min (ref >60)
Glucose, Bld: 92 mg/dL (ref 70–99)
Potassium: 3.9 mmol/L (ref 3.5–5.1)
Sodium: 137 mmol/L (ref 135–145)
Total Bilirubin: 0.5 mg/dL (ref 0.3–1.2)
Total Protein: 6.6 g/dL (ref 6.5–8.1)

## 2021-07-04 LAB — CBC WITH DIFFERENTIAL/PLATELET
Abs Immature Granulocytes: 0.05 10*3/uL (ref 0.00–0.07)
Basophils Absolute: 0 10*3/uL (ref 0.0–0.1)
Basophils Relative: 0 %
Eosinophils Absolute: 0 10*3/uL (ref 0.0–0.5)
Eosinophils Relative: 0 %
HCT: 26.8 % — ABNORMAL LOW (ref 36.0–46.0)
Hemoglobin: 9.4 g/dL — ABNORMAL LOW (ref 12.0–15.0)
Immature Granulocytes: 1 %
Lymphocytes Relative: 13 %
Lymphs Abs: 0.6 10*3/uL — ABNORMAL LOW (ref 0.7–4.0)
MCH: 32.3 pg (ref 26.0–34.0)
MCHC: 35.1 g/dL (ref 30.0–36.0)
MCV: 92.1 fL (ref 80.0–100.0)
Monocytes Absolute: 1 10*3/uL (ref 0.1–1.0)
Monocytes Relative: 25 %
Neutro Abs: 2.5 10*3/uL (ref 1.7–7.7)
Neutrophils Relative %: 61 %
Platelets: 137 10*3/uL — ABNORMAL LOW (ref 150–400)
RBC: 2.91 MIL/uL — ABNORMAL LOW (ref 3.87–5.11)
RDW: 18.6 % — ABNORMAL HIGH (ref 11.5–15.5)
WBC: 4.1 10*3/uL (ref 4.0–10.5)
nRBC: 0 % (ref 0.0–0.2)

## 2021-07-04 LAB — PHOSPHORUS: Phosphorus: 4 mg/dL (ref 2.5–4.6)

## 2021-07-04 LAB — URINALYSIS, ROUTINE W REFLEX MICROSCOPIC
Bilirubin Urine: NEGATIVE
Glucose, UA: NEGATIVE mg/dL
Hgb urine dipstick: NEGATIVE
Ketones, ur: NEGATIVE mg/dL
Leukocytes,Ua: NEGATIVE
Nitrite: NEGATIVE
Protein, ur: NEGATIVE mg/dL
Specific Gravity, Urine: 1.006 (ref 1.005–1.030)
pH: 7 (ref 5.0–8.0)

## 2021-07-04 LAB — MAGNESIUM: Magnesium: 2 mg/dL (ref 1.7–2.4)

## 2021-07-04 MED ORDER — SODIUM CHLORIDE 0.9 % IV SOLN
500.0000 mg/m2 | Freq: Once | INTRAVENOUS | Status: AC
  Administered 2021-07-04: 900 mg via INTRAVENOUS
  Filled 2021-07-04: qty 50

## 2021-07-04 MED ORDER — SODIUM CHLORIDE 0.9 % IV SOLN: INTRAVENOUS | Status: DC

## 2021-07-04 MED ORDER — DIPHENHYDRAMINE HCL 50 MG/ML IJ SOLN
25.0000 mg | Freq: Once | INTRAMUSCULAR | Status: AC
  Administered 2021-07-04: 25 mg via INTRAVENOUS
  Filled 2021-07-04: qty 1

## 2021-07-04 MED ORDER — SODIUM BICARBONATE 8.4 % IV SOLN
INTRAVENOUS | Status: DC
  Filled 2021-07-04 (×2): qty 150
  Filled 2021-07-04 (×2): qty 1000
  Filled 2021-07-04: qty 150
  Filled 2021-07-04 (×4): qty 1000
  Filled 2021-07-04: qty 150
  Filled 2021-07-04: qty 1000
  Filled 2021-07-04 (×2): qty 150

## 2021-07-04 MED ORDER — ACYCLOVIR 200 MG PO CAPS
200.0000 mg | ORAL_CAPSULE | Freq: Two times a day (BID) | ORAL | Status: DC
  Administered 2021-07-04 – 2021-07-09 (×10): 200 mg via ORAL
  Filled 2021-07-04 (×10): qty 1

## 2021-07-04 MED ORDER — BIOTENE DRY MOUTH MT LIQD
15.0000 mL | OROMUCOSAL | Status: DC
  Administered 2021-07-04 – 2021-07-09 (×12): 15 mL via OROMUCOSAL

## 2021-07-04 MED ORDER — SODIUM CHLORIDE 0.9% FLUSH: 3.0000 mL | INTRAVENOUS | Status: DC | PRN

## 2021-07-04 MED ORDER — HEPARIN SOD (PORK) LOCK FLUSH 100 UNIT/ML IV SOLN
500.0000 [IU] | Freq: Every day | INTRAVENOUS | Status: DC | PRN
  Filled 2021-07-04: qty 5

## 2021-07-04 MED ORDER — ENOXAPARIN SODIUM 40 MG/0.4ML IJ SOSY
40.0000 mg | PREFILLED_SYRINGE | INTRAMUSCULAR | Status: DC
  Administered 2021-07-04 – 2021-07-08 (×5): 40 mg via SUBCUTANEOUS
  Filled 2021-07-04 (×5): qty 0.4

## 2021-07-04 MED ORDER — POLYETHYLENE GLYCOL 3350 17 G PO PACK: 17.0000 g | PACK | Freq: Every day | ORAL | Status: DC | PRN

## 2021-07-04 MED ORDER — ACETAMINOPHEN 325 MG PO TABS
650.0000 mg | ORAL_TABLET | Freq: Once | ORAL | Status: AC
  Administered 2021-07-04: 650 mg via ORAL
  Filled 2021-07-04: qty 2

## 2021-07-04 MED ORDER — DOCUSATE SODIUM 100 MG PO CAPS
100.0000 mg | ORAL_CAPSULE | Freq: Two times a day (BID) | ORAL | Status: DC | PRN
  Administered 2021-07-05: 100 mg via ORAL
  Filled 2021-07-04: qty 1

## 2021-07-04 NOTE — H&P (Addendum)
[place]  ?Telephone:(336) [phone] [phone]) [phone]  ? ?MEDICAL ONCOLOGY - ADMISSION H&P ? ?Reason for Admission: Diffuse large cell non-Hodgkin's lymphoma with CNS involvement, administration of systemic chemotherapy ? ?HPI: 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  She previously received systemic chemotherapy with R-ICE/R-MTV and was then switched to the MATRIX protocol beginning on 05/02/2021.  Status post 2 cycles. ? ?The patient was seen this morning on the day of admission.  She is feeling well.  She denies mucositis, abdominal pain, nausea, vomiting, constipation, diarrhea.  She denies headaches.  She is eating and drinking well.  No bleeding reported.  She is being admitted today to begin her next cycle of chemotherapy with the MATRIX protocol.. ? ?Past Medical History:  ?Diagnosis Date  ? Chicken pox   ? Complication of anesthesia   ? Per patient, very slow to wake up after anesthesia  ? Goals of care, counseling/discussion 12/22/2020  ? High cholesterol   ? High grade B-cell lymphoma ([place]) 12/22/2020  ? Hyperglycemia 11/10/2020  ? Melanoma ([place]) 2021  ? right hip  ? PONV (postoperative nausea and vomiting)   ? Urinary incontinence   ?: ? ? ?Past Surgical History:  ?Procedure Laterality Date  ? AIR/FLUID EXCHANGE Right 12/07/2020  ? Procedure: AIR/FLUID EXCHANGE;  Surgeon: Jalene Mullet, MD;  Location: [facility];  Service: Ophthalmology;  Laterality: Right;  ? APPENDECTOMY  1962  ? BIOPSY  12/14/2020  ? Procedure: BIOPSY;  Surgeon: Irving Copas., MD;  Location: [place];  Service: Gastroenterology;;  ? ESOPHAGOGASTRODUODENOSCOPY (EGD) WITH PROPOFOL N/A 12/14/2020  ? Procedure:  ESOPHAGOGASTRODUODENOSCOPY (EGD) WITH PROPOFOL;  Surgeon: Rush Landmark Telford Nab., MD;  Location: [facility];  Service: Gastroenterology;  Laterality: N/A;  ? EXCISION MELANOMA WITH SENTINEL LYMPH NODE BIOPSY Right 10/09/2019  ? Procedure: WIDE LOCAL EXCISION WITH ADVANCEMENT FLAP CLOSURE RIGHT HIP MELANOMA WITH SENTINEL LYMPH NODE BIOPSY;  Surgeon: Stark Klein, MD;  Location: [facility] [place];  Service: General;  Laterality: Right;  ? FINE NEEDLE ASPIRATION  12/14/2020  ? Procedure: FINE NEEDLE ASPIRATION (FNA) LINEAR;  Surgeon: Irving Copas., MD;  Location: [facility] ENDOSCOPY;  Service: Gastroenterology;;  ? HERNIA REPAIR  2009  ? IR IMAGING GUIDED PORT INSERTION  12/30/2020  ? MOUTH SURGERY  11/2015  ? gum surgery and bone implant  ? PARS PLANA VITRECTOMY Right 12/07/2020  ? Procedure: PARS PLANA VITRECTOMY WITH 25 GAUGE;  Surgeon: Jalene Mullet, MD;  Location: [facility];  Service: Ophthalmology;  Laterality: Right;  ? PHOTOCOAGULATION WITH LASER Right 12/07/2020  ? Procedure: PHOTOCOAGULATION WITH ENDOLASER PANRITINAL COAGULATION;  Surgeon: Jalene Mullet, MD;  Location: [place];  Service: Ophthalmology;  Laterality: Right;  ? UPPER ESOPHAGEAL ENDOSCOPIC ULTRASOUND (EUS) N/A 12/14/2020  ? Procedure: UPPER ESOPHAGEAL ENDOSCOPIC ULTRASOUND (EUS);  Surgeon: Irving Copas., MD;  Location: [place] [facility];  Service: Gastroenterology;  Laterality: N/A;  ? WISDOM TOOTH EXTRACTION    ?: ? ? ?No current facility-administered medications for this encounter.  ? ? ? ? ?Allergies  ?Allergen Reactions  ? Decadron [Dexamethasone] Other (See Comments)  ?  Made the patient's legs tired and she could not walk  ? Plavix [Clopidogrel] Hives, Swelling and Other (See Comments)  ?  Lips became swollen ?  ? Prednisone Other (  See Comments)  ?  Steroids caused psychological issues   ? Codeine Nausea Only  ?: ? ? ?Family History  ?Problem Relation Age of Onset  ? Coronary artery disease Father   ?     died at 53  ? COPD Father   ? Alcohol  abuse Father   ? Diabetes Mellitus II Father   ? Coronary artery disease Sister   ? Cancer Sister   ?     breast  ? Heart attack Brother   ? Hyperlipidemia Other   ?     died 3- "natural causes"  ? Hyperlipidemia Other   ?     4 siblings   ? Hypertension Other   ?     3 siblings  ?: ? ? ?Social History  ? ?Socioeconomic History  ? Marital status: Widowed  ?  Spouse name: Not on file  ? Number of children: Not on file  ? Years of education: Not on file  ? Highest education level: Not on file  ?Occupational History  ? Occupation: retired  ?Tobacco Use  ? Smoking status: Never  ? Smokeless tobacco: Never  ?Vaping Use  ? Vaping Use: Never used  ?Substance and Sexual Activity  ? Alcohol use: Never  ? Drug use: Never  ? Sexual activity: Not Currently  ?Other Topics Concern  ? Not on file  ?Social History Narrative  ? 05/09/2021  ? Presently living at [facility] in [place].  ? One Son- lives locally, 1 grand-daughterDaughter- [place] Acupuncturist schoolWorks as a Probation officer.  ? ?Social Determinants of Health  ? ?Financial Resource Strain: Low Risk   ? Difficulty of Paying Living Expenses: Not hard at all  ?Food Insecurity: No Food Insecurity  ? Worried About Charity fundraiser in the Last Year: Never true  ? Ran Out of Food in the Last Year: Never true  ?Transportation Needs: No Transportation Needs  ? Lack of Transportation (Medical): No  ? Lack of Transportation (Non-Medical): No  ?Physical Activity: Sufficiently Active  ? Days of Exercise per Week: 4 days  ? Minutes of Exercise per Session: 40 min  ?Stress: No Stress Concern Present  ? Feeling of Stress : Not at all  ?Social Connections: Moderately Isolated  ? Frequency of Communication with Friends and Family: More than three times a week  ? Frequency of Social Gatherings with Friends and Family: More than three times a week  ? Attends Religious Services: More than 4 times per year  ? Active Member of Clubs or Organizations: No  ? Attends  Archivist Meetings: Never  ? Marital Status: Widowed  ?Intimate Partner Violence: Not At Risk  ? Fear of Current or Ex-Partner: No  ? Emotionally Abused: No  ? Physically Abused: No  ? Sexually Abused: No  ?: ? ?Review of Systems: A comprehensive 14 point review of systems was negative except as noted in the HPI. ? ?Exam: ?No data found. ? ? ?General:  well-nourished in no acute distress.   ?Eyes:  no scleral icterus.   ?ENT:  There were no oropharyngeal lesions.   ?Lymphatics:  Negative cervical, supraclavicular or axillary adenopathy.   ?Respiratory: lungs were clear bilaterally without wheezing or crackles.   ?Cardiovascular:  Regular rate and rhythm, S1/S2, without murmur, rub or gallop.  There was no pedal edema.   ?GI:  abdomen was soft, flat, nontender, nondistended, without organomegaly.    ?Skin exam was without echymosis, petichae.   ?Neuro exam  was nonfocal. Patient was alert and oriented.  Attention was good.   Language was appropriate.  Mood was normal without depression.  Speech was not pressured.  Thought content was not tangential.   ? ? ?Lab Results  ?Component Value Date  ? WBC 4.3 06/30/2021  ? HGB 9.0 (L) 06/30/2021  ? HCT 26.8 (L) 06/30/2021  ? PLT 79 (L) 06/30/2021  ? GLUCOSE 98 06/30/2021  ? CHOL 134 10/07/2020  ? TRIG 36 10/07/2020  ? HDL 44 10/07/2020  ? G[place] 83 10/07/2020  ? ALT 21 06/30/2021  ? AST 22 06/30/2021  ? NA 138 06/30/2021  ? K 4.4 06/30/2021  ? CL 99 06/30/2021  ? CREATININE 0.80 06/30/2021  ? BUN 13 06/30/2021  ? CO2 32 06/30/2021  ? ? ?MR Brain W Wo Contrast ? ?Result Date: 06/30/2021 ?CLINICAL DATA:  CNS lymphoma after chemotherapy EXAM: MRI HEAD WITHOUT AND WITH CONTRAST TECHNIQUE: Multiplanar, multiecho pulse sequences of the brain and surrounding structures were obtained without and with intravenous contrast. CONTRAST:  68m GADAVIST GADOBUTROL 1 MMOL/ML IV SOLN COMPARISON:  04/07/2021 FINDINGS: Brain: Significant positive response to therapy. No residual  abnormal enhancement in the brain. Subtle FLAIR hyperintensity in the upper left cerebral peduncle and subcortical high and anterior left frontal lobe at sites of prior enhancing disease. Encephalomalacia in the posterior le

## 2021-07-04 NOTE — H&P (Deleted)
?Hematology and Oncology Follow Up Visit ? ?Ariel [name] ?[id number] ?[birth year] 66 y.o. ?05/16/2021 ? ? ?Principle Diagnosis:  ?Diffuse large cell non-Hodgkin's lymphoma-CNS involvement ? ?Current Therapy:   ?Status post chemotherapy with R-ICE/R-MTV --  s/p cycle #4 ?MATRIX -- s/p cycle#1 -- start on 05/02/2021 ?Neulasta 6 mg subcu post chemotherapy ?    ?Interim History:  Ariel [name] is comes in for an unscheduled visit.  Unfortunate, she began to have bleeding over the weekend.  Starting Sunday, she began to have bleeding from the oral cavity.  She is incredibly fatigued.  She is quite weak.  She has had no nausea or vomiting.  She has not noted any melena or bright red blood per rectum.  She has had no cough.she has had some shortness of breath with exertion. ? ?Today, her blood counts are incredibly low.  Her white cell count is 200.  Hemoglobin 4.8.  Platelet count less than 5000. ? ?We will get have to admit her.  She clearly has had quite marked response to the protocol.  A week ago her blood counts were fine.  We did go ahead and give her Neulasta.  We did get her on antibiotics. ? ?She has had no headache.  There is no double vision or blurred vision.  She has not noted any obvious mucositis.  She just had bleeding from the gums. ? ?She has had a little bit of a rash on her legs.  She has multiple petechia on her legs. ? ?Currently, I would have to say that her performance status is ECOG 1.    ? ?Medications: No current facility-administered medications for this visit. ?No current outpatient medications on file. ? ?Facility-Administered Medications Ordered in Other Visits:  ?  0.9 %  sodium chloride infusion, , Intravenous, Continuous, Ennever, Rudell Cobb, MD, Last Rate: 50 mL/hr at 05/16/21 1525, New Bag at 64/40/34 7425 ?  folic acid (FOLVITE) tablet 1 mg, 1 mg, Oral, Daily, Ennever, Rudell Cobb, MD, 1 mg at 05/16/21 1620 ?  furosemide (LASIX) injection 20 mg, 20 mg, Intravenous, Once, Ennever, Rudell Cobb, MD ?   furosemide (LASIX) injection 20 mg, 20 mg, Intravenous, Once, Ennever, Rudell Cobb, MD ? ?Allergies:  ?Allergies  ?Allergen Reactions  ? Codeine Nausea Only  ? Decadron [Dexamethasone] Other (See Comments)  ?  Unknown reaction  ? Plavix [Clopidogrel] Hives and Swelling  ?  ,lip swelling ?  ? Prednisone Other (See Comments)  ?  Steroids caused psychological issues   ? ? ?Past Medical History, Surgical history, Social history, and Family History were reviewed and updated. ? ?Review of Systems: ?Review of Systems  ?Constitutional: Negative.   ?HENT:  Negative.    ?Eyes: Negative.   ?Respiratory: Negative.    ?Cardiovascular: Negative.   ?Gastrointestinal: Negative.   ?Endocrine: Negative.   ?Genitourinary: Negative.    ?Musculoskeletal: Negative.   ?Skin: Negative.   ?Neurological: Negative.   ?Hematological: Negative.   ?Psychiatric/Behavioral: Negative.    ? ?Physical Exam: ? weight is 149 lb (67.6 kg). Her oral temperature is 98 ?F (36.7 ?C). Her blood pressure is 117/55 (abnormal) and her pulse is 118 (abnormal). Her respiration is 18 and oxygen saturation is 98%.  ? ?Wt Readings from Last 3 Encounters:  ?05/16/21 149 lb (67.6 kg)  ?05/09/21 150 lb (68 kg)  ?05/07/21 155 lb 13.8 oz (70.7 kg)  ? ? ?Physical Exam ?Vitals reviewed.  ?HENT:  ?   Head: Normocephalic and atraumatic.  ?   Mouth/Throat:  ?  Comments: Oral exam shows multiple sites of gingival bleeding.  She has gauze in her mouth to try to help with the bleeding.  There is no obvious gingival breakdown. ?Eyes:  ?   Pupils: Pupils are equal, round, and reactive to light.  ?Cardiovascular:  ?   Rate and Rhythm: Normal rate and regular rhythm.  ?   Heart sounds: Normal heart sounds.  ?   Comments: Cardiac exam is regular rate and rhythm with no murmurs, rubs or bruits. ?Pulmonary:  ?   Effort: Pulmonary effort is normal.  ?   Breath sounds: Normal breath sounds.  ?Abdominal:  ?   General: Bowel sounds are normal.  ?   Palpations: Abdomen is soft.   ?Musculoskeletal:     ?   General: No tenderness or deformity. Normal range of motion.  ?   Cervical back: Normal range of motion.  ?Lymphadenopathy:  ?   Cervical: No cervical adenopathy.  ?Skin: ?   General: Skin is warm and dry.  ?   Findings: No erythema or rash.  ?   Comments: Skin exam does show petechia on her lower legs.  ?Neurological:  ?   Mental Status: She is alert and oriented to person, place, and time.  ?   Comments: Neurological exam shows no focal neurological deficits.  All cranial nerves are intact.  She has good strength in upper and lower extremities.  ?Psychiatric:     ?   Behavior: Behavior normal.     ?   Thought Content: Thought content normal.     ?   Judgment: Judgment normal.  ? ? ? ?Lab Results  ?Component Value Date  ? WBC 0.2 (LL) 05/16/2021  ? HGB 4.8 (LL) 05/16/2021  ? HCT 13.1 (L) 05/16/2021  ? MCV 87.3 05/16/2021  ? PLT <5 (LL) 05/16/2021  ? ?  Chemistry   ?   ?Component Value Date/Time  ? NA 134 (L) 05/16/2021 1215  ? K 4.1 05/16/2021 1215  ? CL 100 05/16/2021 1215  ? CO2 26 05/16/2021 1215  ? BUN 23 05/16/2021 1215  ? CREATININE 0.69 05/16/2021 1215  ? CREATININE 0.70 05/09/2021 1015  ? CREATININE 0.68 11/18/2012 1439  ?    ?Component Value Date/Time  ? CALCIUM 9.6 05/16/2021 1215  ? ALKPHOS 87 05/16/2021 1215  ? AST 13 (L) 05/16/2021 1215  ? AST 130 (H) 05/09/2021 1015  ? ALT 47 (H) 05/16/2021 1215  ? ALT 349 (HH) 05/09/2021 1015  ? BILITOT 0.5 05/16/2021 1215  ? BILITOT 0.8 05/09/2021 1015  ?  ? ? ?Impression and Plan: ?Ariel [name] is a very charming 66 year old white female.  She initially came to [place] with iron deficiency anemia.  We subsequently found that she had diffuse large cell non-Hodgkin's lymphoma.  It had traveled to her brain. ? ?She has had 4 cycles of treatment.  We basically gave her R-ICE alternating with R-MTV.  She tolerated this very well. ? ?We gave her MATRIX protocol in the hospital about 3 weeks ago.  She tolerated this well.  A week ago her blood counts  were fine. ? ?We will get have to admit her.  She will need to be transfused with 2 units of blood and 1 unit of platelets.  We will give these to her tonight.  We will have to put her on some Amicar to try to help with the severe thrombocytopenia. ? ?She is on antibiotics already.  She is not febrile.  I do not  think we need IV antibiotics for her right now. ? ?I do not see any neurological compromise right now.  Provide think we can hold on doing scans on her. ? ?I suspect she will be in the hospital for a good for 5 days.  Again I am not sure how quickly her blood counts will recover. ? ?I know that she will get wonderful care from the staff up on 6 [address].  They know her well. ? ? ?Volanda Napoleon, MD ?3/20/20234:50 PM  ?

## 2021-07-04 NOTE — Progress Notes (Signed)
Patient successfully admitted to [facility] for her next cycle of MATRIX. Will follow for post discharge needs and follow up appointments.  ? ?Oncology Nurse Navigator Documentation ? ? ?  07/04/2021  ?  8:00 AM  ?Oncology Nurse Navigator Flowsheets  ?Navigator Follow Up Date: 07/11/2021  ?Navigator Follow Up Reason: Appointment Review  ?Navigator Location [facility]  ?Navigator Encounter Type Other:  ?Patient Visit Type MedOnc  ?Treatment Phase Active Tx  ?Barriers/Navigation Needs Coordination of Care;Education  ?Interventions None Required  ?Acuity Level 2-Minimal Needs (1-2 Barriers Identified)  ?Support Groups/Services Friends and Family  ?Time Spent with Patient 15  ?  ?

## 2021-07-05 DIAGNOSIS — C8330 Diffuse large B-cell lymphoma, unspecified site: Secondary | ICD-10-CM | POA: Diagnosis not present

## 2021-07-05 MED ORDER — SODIUM CHLORIDE 0.9 % IV SOLN
Freq: Once | INTRAVENOUS | Status: AC
  Administered 2021-07-06: 16 mg via INTRAVENOUS
  Filled 2021-07-05: qty 8

## 2021-07-05 MED ORDER — SODIUM CHLORIDE 0.9 % IV SOLN
3000.0000 mg | Freq: Two times a day (BID) | INTRAVENOUS | Status: AC
  Administered 2021-07-06: 3000 mg via INTRAVENOUS
  Filled 2021-07-05 (×4): qty 150

## 2021-07-05 MED ORDER — SODIUM CHLORIDE 0.9 % IV SOLN
Freq: Once | INTRAVENOUS | Status: AC
  Administered 2021-07-05: 16 mg via INTRAVENOUS
  Filled 2021-07-05 (×2): qty 8

## 2021-07-05 MED ORDER — SODIUM CHLORIDE 0.9 % IV SOLN
8.0000 mg | Freq: Three times a day (TID) | INTRAVENOUS | Status: DC | PRN
  Administered 2021-07-05: 8 mg via INTRAVENOUS
  Filled 2021-07-05: qty 4

## 2021-07-05 MED ORDER — METHOTREXATE SODIUM (PF) CHEMO INJECTION 1 GM/40ML
500.0000 mg/m2 | Freq: Once | INTRAVENOUS | Status: AC
  Administered 2021-07-05: 905 mg via INTRAVENOUS
  Filled 2021-07-05: qty 36.2

## 2021-07-05 MED ORDER — METHOTREXATE SODIUM (PF) CHEMO INJECTION 1 GM/40ML
3.0000 g/m2 | Freq: Once | INTRAVENOUS | Status: AC
  Administered 2021-07-05: 5.43 g via INTRAVENOUS
  Filled 2021-07-05: qty 217.2

## 2021-07-05 MED ORDER — SODIUM CHLORIDE 0.9 % IV SOLN
150.0000 mg | Freq: Once | INTRAVENOUS | Status: AC
  Administered 2021-07-06: 150 mg via INTRAVENOUS
  Filled 2021-07-05: qty 5

## 2021-07-05 MED ORDER — DEXAMETHASONE SODIUM PHOSPHATE 0.1 % OP SOLN
2.0000 [drp] | Freq: Four times a day (QID) | OPHTHALMIC | Status: DC
  Administered 2021-07-05 – 2021-07-09 (×15): 2 [drp] via OPHTHALMIC
  Filled 2021-07-05: qty 5

## 2021-07-05 MED ORDER — CHLORHEXIDINE GLUCONATE CLOTH 2 % EX PADS
6.0000 | MEDICATED_PAD | Freq: Every day | CUTANEOUS | Status: DC
  Administered 2021-07-05 – 2021-07-08 (×4): 6 via TOPICAL

## 2021-07-05 NOTE — Progress Notes (Signed)
No urine pH needed today.  Ok to use pH from yesterday per Dr. Marin Olp. ? ?Kennith Center, Pharm.D., CPP ?07/05/2021'@9'$ :22 AM ? ? ?

## 2021-07-05 NOTE — TOC Initial Note (Signed)
Transition of Care (TOC) - Initial/Assessment Note  ? ? ?Patient Details  ?Name: Jamylah Marinaccio ?MRN: [id number] ?Date of Birth: [birth year] ? ?Transition of Care (TOC) CM/SW Contact:    ?Abdi Husak, Marjie Skiff, RN ?Phone Number: ?07/05/2021, 2:18 PM ? ?Clinical Narrative:                 ?Pt will return to Independent living at [facility] at dc. ? ?Expected Discharge Plan: Home/Self Care ?Barriers to Discharge: Continued Medical Work up ? ? ?Expected Discharge Plan and Services ?Expected Discharge Plan: Home/Self Care ?  ?Discharge Planning Services: CM Consult ?  ?Living arrangements for the past 2 months: [place] ?                ?   ? ?Prior Living Arrangements/Services ?Living arrangements for the past 2 months: [place] ?Lives with:: Self ?Patient language and need for interpreter reviewed:: Yes ?       ?Need for Family Participation in Patient Care: Yes (Comment) ?Care giver support system in place?: Yes (comment) ?  ?Criminal Activity/Legal Involvement Pertinent to Current Situation/Hospitalization: No - Comment as needed ? ?Activities of Daily Living ?Home Assistive Devices/Equipment: Cane (specify quad or straight) ?ADL Screening (condition at time of admission) ?Patient's cognitive ability adequate to safely complete daily activities?: Yes ?Is the patient deaf or have difficulty hearing?: No ?Does the patient have difficulty seeing, even when wearing glasses/contacts?: No ?Does the patient have difficulty concentrating, remembering, or making decisions?: No ?Patient able to express need for assistance with ADLs?: Yes ?Does the patient have difficulty dressing or bathing?: No ?Independently performs ADLs?: Yes (appropriate for developmental age) ?Does the patient have difficulty walking or climbing stairs?: No ?Weakness of Legs: Both ?Weakness of Arms/Hands: None ? ?   ? ?Emotional Assessment ?Appearance:: Appears stated age ?Attitude/Demeanor/Rapport: Gracious ?Affect  (typically observed): Calm ?Orientation: : Oriented to Self, Oriented to Place, Oriented to  Time, Oriented to Situation ?Alcohol / Substance Use: Not Applicable ?Psych Involvement: No (comment) ? 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 Diagnosis Date Noted  ? Pancytopenia ([place]) 05/16/2021  ? 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 Diffuse large B cell lymphoma ([place]) 03/07/2021  ? Encounter for antineoplastic chemotherapy 03/07/2021  ? AMS (altered mental status) 01/14/2021  ? Neutropenia ([place]) 01/14/2021  ? High grade B-cell lymphoma ([place]) 12/22/2020  ? Goals of care, counseling/discussion 12/22/2020  ? Acute metabolic encephalopathy 04/88/8916  ? Brain metastasis 12/09/2020  ? Brain tumor ([place]) 12/08/2020  ? Hyponatremia 12/08/2020  ? Night sweat 11/30/2020  ? Chicken pox 11/22/2020  ? Complication of anesthesia 11/22/2020  ? PONV (postoperative nausea and vomiting) 11/22/2020  ? Skin rash 11/10/2020  ? Tachycardia 11/10/2020  ? Anemia 11/10/2020  ? Hyperglycemia 11/10/2020  ? TIA (transient ischemic attack) 10/07/2020  ? Iron deficiency anemia 10/07/2020  ? Aphasia 10/06/2020  ? Melanoma ([place]) 2021  ? Multinodular goiter 12/22/2013  ? Subclinical hyperthyroidism 12/19/2013  ? Urinary incontinence 11/19/2013  ? Hyperlipidemia 11/19/2012  ? Routine general medical examination at a health care facility 11/18/2012  ? ?PCP:  [name], NP ?Pharmacy:   ?[facility] ?[place] [address] ?Phone: [phone] Fax: [phone] ? ? ? ? ?Readmission Risk Interventions ? ?  07/05/2021  ?  2:17 PM 06/08/2021  ? 11:18 AM 05/18/2021  ?  1:32 PM  ?Readmission Risk  Prevention Plan  ?Transportation Screening Complete Complete Complete  ?PCP or Specialist Appt within 3-5 Days   Complete  ?[place] or Home Care Consult   Complete  ?Social Work Consult for [place] Planning/Counseling   Complete  ?Palliative Care Screening   Not Applicable  ?Medication Review Press photographer) Complete Complete Complete  ?PCP or Specialist appointment within 3-5 days of discharge Complete Complete   ?[place] or Home Care Consult Complete Complete   ?SW Recovery Care/Counseling Consult Complete Complete   ?Palliative Care Screening Not Applicable Not Applicable   ?[place] Not Applicable Not Applicable   ? ? ? ?

## 2021-07-05 NOTE — Progress Notes (Signed)
Ariel [name] got her first day Rituxan yesterday.  She will start chemotherapy today. ? ?Her left hip is doing much better.  She had an injection by Dr. Rhona Raider on Friday.  At some point, she will need to have this fixed.  This will have to wait till after her chemotherapy is completed. ? ?She has had no headache.  She has had no nausea or vomiting.  She has had no problems with bowels or bladder. ? ?Her appetite is doing well. ? ?She has had no mouth sores. ? ?Her physical exam shows vital signs of 98.  Pulse 73.  Blood pressure 121/43.  Her head neck exam shows no ocular or oral lesions.  She has no adenopathy in the neck.  Lungs are clear bilaterally.  Cardiac exam regular rate and rhythm.  She has no murmurs, rubs or bruits.  Abdomen is soft.  She has good bowel sounds.  There is no fluid wave.  There is no palpable liver or spleen tip.  Extremity shows no clubbing, cyanosis or edema.  Neurological exam is nonfocal. ? ?Ariel [name] is in for her third round of chemotherapy with MATRIX.  She has had a very nice response by the last MRI of the brain.  There is no evidence of lymphoma in the brain. ? ?We will go ahead with chemotherapy today.  She will have chemotherapy for the next 3 days. ? ?We will check her labs tomorrow. ? ?I do appreciate the incredible care that she is getting from the staff on 6 E. ? ?[name], MD ? ?James 1:12 ? ?

## 2021-07-06 ENCOUNTER — Inpatient Hospital Stay (HOSPITAL_COMMUNITY)
Admission: RE | Admit: 2021-07-06 | Discharge: 2021-07-06 | Disposition: A | Discharge status: Home / Self Care | Payer: Medicare Other | Source: Ambulatory Visit | Attending: Hematology & Oncology | Admitting: Hematology & Oncology

## 2021-07-06 DIAGNOSIS — C8330 Diffuse large B-cell lymphoma, unspecified site: Secondary | ICD-10-CM | POA: Diagnosis not present

## 2021-07-06 LAB — CBC WITH DIFFERENTIAL/PLATELET
Abs Immature Granulocytes: 0.03 10*3/uL (ref 0.00–0.07)
Basophils Absolute: 0 10*3/uL (ref 0.0–0.1)
Basophils Relative: 0 %
Eosinophils Absolute: 0 10*3/uL (ref 0.0–0.5)
Eosinophils Relative: 0 %
HCT: 26.6 % — ABNORMAL LOW (ref 36.0–46.0)
Hemoglobin: 8.9 g/dL — ABNORMAL LOW (ref 12.0–15.0)
Immature Granulocytes: 1 %
Lymphocytes Relative: 17 %
Lymphs Abs: 0.4 10*3/uL — ABNORMAL LOW (ref 0.7–4.0)
MCH: 30.8 pg (ref 26.0–34.0)
MCHC: 33.5 g/dL (ref 30.0–36.0)
MCV: 92 fL (ref 80.0–100.0)
Monocytes Absolute: 0.5 10*3/uL (ref 0.1–1.0)
Monocytes Relative: 22 %
Neutro Abs: 1.3 10*3/uL — ABNORMAL LOW (ref 1.7–7.7)
Neutrophils Relative %: 60 %
Platelets: 140 10*3/uL — ABNORMAL LOW (ref 150–400)
RBC: 2.89 MIL/uL — ABNORMAL LOW (ref 3.87–5.11)
RDW: 19.5 % — ABNORMAL HIGH (ref 11.5–15.5)
WBC: 2.2 10*3/uL — ABNORMAL LOW (ref 4.0–10.5)
nRBC: 0 % (ref 0.0–0.2)

## 2021-07-06 LAB — COMPREHENSIVE METABOLIC PANEL
ALT: 35 U/L (ref 0–44)
AST: 30 U/L (ref 15–41)
Albumin: 3.2 g/dL — ABNORMAL LOW (ref 3.5–5.0)
Alkaline Phosphatase: 72 U/L (ref 38–126)
Anion gap: 6 (ref 5–15)
BUN: 10 mg/dL (ref 8–23)
CO2: 36 mmol/L — ABNORMAL HIGH (ref 22–32)
Calcium: 8.9 mg/dL (ref 8.9–10.3)
Chloride: 98 mmol/L (ref 98–111)
Creatinine, Ser: 0.71 mg/dL (ref 0.44–1.00)
GFR, Estimated: >60 mL/min (ref >60)
Glucose, Bld: 121 mg/dL — ABNORMAL HIGH (ref 70–99)
Potassium: 3.2 mmol/L — ABNORMAL LOW (ref 3.5–5.1)
Sodium: 140 mmol/L (ref 135–145)
Total Bilirubin: 0.5 mg/dL (ref 0.3–1.2)
Total Protein: 5.5 g/dL — ABNORMAL LOW (ref 6.5–8.1)

## 2021-07-06 LAB — URINALYSIS, ROUTINE W REFLEX MICROSCOPIC
Bilirubin Urine: NEGATIVE
Glucose, UA: NEGATIVE mg/dL
Hgb urine dipstick: NEGATIVE
Ketones, ur: NEGATIVE mg/dL
Leukocytes,Ua: NEGATIVE
Nitrite: NEGATIVE
Protein, ur: NEGATIVE mg/dL
Specific Gravity, Urine: 1.004 — ABNORMAL LOW (ref 1.005–1.030)
pH: 9 — ABNORMAL HIGH (ref 5.0–8.0)

## 2021-07-06 LAB — LACTATE DEHYDROGENASE: LDH: 159 U/L (ref 98–192)

## 2021-07-06 LAB — METHOTREXATE: Methotrexate: 8.78

## 2021-07-06 MED ORDER — SODIUM CHLORIDE 0.9 % IV SOLN
Freq: Once | INTRAVENOUS | Status: AC
  Administered 2021-07-07: 16 mg via INTRAVENOUS
  Filled 2021-07-06: qty 8

## 2021-07-06 MED ORDER — PROCHLORPERAZINE EDISYLATE 10 MG/2ML IJ SOLN
10.0000 mg | Freq: Four times a day (QID) | INTRAMUSCULAR | Status: DC | PRN
  Administered 2021-07-06: 10 mg via INTRAVENOUS
  Filled 2021-07-06: qty 2

## 2021-07-06 MED ORDER — LEUCOVORIN CALCIUM INJECTION 100 MG
30.0000 mg/m2 | Freq: Four times a day (QID) | INTRAMUSCULAR | Status: AC
  Administered 2021-07-06 – 2021-07-07 (×4): 54 mg via INTRAVENOUS
  Filled 2021-07-06 (×4): qty 2.7

## 2021-07-06 MED ORDER — POTASSIUM CHLORIDE 10 MEQ/50ML IV SOLN
10.0000 meq | INTRAVENOUS | Status: AC
  Administered 2021-07-06 (×3): 10 meq via INTRAVENOUS
  Filled 2021-07-06 (×5): qty 50

## 2021-07-06 MED ORDER — SODIUM CHLORIDE 0.9 % IV SOLN
3000.0000 mg | Freq: Two times a day (BID) | INTRAVENOUS | Status: AC
  Administered 2021-07-06 – 2021-07-07 (×3): 3000 mg via INTRAVENOUS
  Filled 2021-07-06 (×4): qty 150

## 2021-07-06 MED ORDER — POTASSIUM CHLORIDE 10 MEQ/100ML IV SOLN
INTRAVENOUS | Status: AC
  Filled 2021-07-06: qty 100

## 2021-07-06 NOTE — Progress Notes (Signed)
PHARMACY HIGH-DOSE METHOTREXATE MONITORING ? ?Ariel [name] is a 66 yo female admitted to receive the MATRIX protocol.  Patient received high-dose MTX on 5/9. ? ?MTX level obtained this morning = 8.78 ? ?Lab result called to Dr. Marin Olp.  Verbal orders to increase Leucovorin rescue to '30mg'$ /m2 ('54mg'$ ) IV q6 hours starting at 1200 today (~24h after MTX infusion) ? ?Dimple Nanas, PharmD ?07/06/2021 8:57 AM ? ?

## 2021-07-06 NOTE — Progress Notes (Signed)
Chemotherapy dosage and calculations verified with Corene Cornea, RN. All neurological assessments WNL prior to cytarabine infusion. No concerns at this time. Will continue to monitor.  ?

## 2021-07-06 NOTE — Progress Notes (Signed)
Patient received Cytarabine today per Drs Orders. Patient did well with treatment. No issues at this time. Report given to [facility], RN before leaving. ?

## 2021-07-06 NOTE — Progress Notes (Signed)
Ms. Schlottman is doing okay.  She has some vomiting yesterday.  She feels much better this morning. ? ?She got methotrexate yesterday.  We will be following her levels now.  As always, pharmacy does a fantastic job in managing this.  She is on leucovorin.  Again we will see what the methotrexate level is. ? ?Her labs show white cell count 2.2.  Hemoglobin 8.9.  Platelet count 140,000.  Her potassium is 3.2.  We will give her some IV potassium.  Her LFTs are normal.  Her creatinine is 0.71. ? ?She has had no diarrhea.  There is no mouth sores.  Her left hip is doing okay.  The injection that  she received at Dr. Jerald Kief office really has helped. ? ?Her vital signs show temperature 98.2.  Pulse 78.  Blood pressure 112/55.  Oral exam does not show any mucositis.  There is no adenopathy in the neck.  There is no scleral icterus.  She is on Decadron eyedrops right now.  Lungs are clear.  Cardiac exam regular rate and rhythm.  Abdomen is soft.  Bowel sounds might be a little bit decreased.  There is no guarding or rebound tenderness.  Extremity shows no clubbing, cyanosis or edema.  Skin exam shows no rashes.  Neurological exam is nonfocal. ? ?Ms. Mcquaid will receive cytarabine today.  I think she gets 2 doses of this.  Then she will get thiotepa. ? ?Again we will see what the methotrexate level is.  Typically, she clears out the methotrexate very quickly. ? ?We will have to watch her blood counts.  Her white cell count is already dropping.  Her hemoglobin is dropping.  It would not surprise me if we have to transfuse her while she is in the hospital. ? ?I will give her some extra potassium today. ? ?I appreciate the incredible care that she is getting from the staff up on 6 [address].  She really enjoys the staff and knows that they are doing a great job with her and with all the patients. ? ?[name], MD ? ?Isaiah 41:10 ?

## 2021-07-06 NOTE — Progress Notes (Signed)
Cytarabine 3000 mg IV given at 1830 per dr orders per policy. Pt has some nausea and few rounds of diarrhea, but all neuro screenings are negative.  Will continue to monitor.   ?

## 2021-07-07 LAB — CBC WITH DIFFERENTIAL/PLATELET
Abs Immature Granulocytes: 0.02 10*3/uL (ref 0.00–0.07)
Basophils Absolute: 0 10*3/uL (ref 0.0–0.1)
Basophils Relative: 0 %
Eosinophils Absolute: 0 10*3/uL (ref 0.0–0.5)
Eosinophils Relative: 0 %
HCT: 25.1 % — ABNORMAL LOW (ref 36.0–46.0)
Hemoglobin: 8.5 g/dL — ABNORMAL LOW (ref 12.0–15.0)
Immature Granulocytes: 0 %
Lymphocytes Relative: 2 %
Lymphs Abs: 0.1 10*3/uL — ABNORMAL LOW (ref 0.7–4.0)
MCH: 31.3 pg (ref 26.0–34.0)
MCHC: 33.9 g/dL (ref 30.0–36.0)
MCV: 92.3 fL (ref 80.0–100.0)
Monocytes Absolute: 0.1 10*3/uL (ref 0.1–1.0)
Monocytes Relative: 2 %
Neutro Abs: 5.6 10*3/uL (ref 1.7–7.7)
Neutrophils Relative %: 96 %
Platelets: 119 10*3/uL — ABNORMAL LOW (ref 150–400)
RBC: 2.72 MIL/uL — ABNORMAL LOW (ref 3.87–5.11)
RDW: 19.7 % — ABNORMAL HIGH (ref 11.5–15.5)
WBC: 5.8 10*3/uL (ref 4.0–10.5)
nRBC: 0 % (ref 0.0–0.2)

## 2021-07-07 LAB — URINALYSIS, ROUTINE W REFLEX MICROSCOPIC
Bilirubin Urine: NEGATIVE
Glucose, UA: NEGATIVE mg/dL
Hgb urine dipstick: NEGATIVE
Ketones, ur: NEGATIVE mg/dL
Leukocytes,Ua: NEGATIVE
Nitrite: NEGATIVE
Protein, ur: NEGATIVE mg/dL
Specific Gravity, Urine: 1.009 (ref 1.005–1.030)
pH: 9 — ABNORMAL HIGH (ref 5.0–8.0)

## 2021-07-07 LAB — COMPREHENSIVE METABOLIC PANEL
ALT: 44 U/L (ref 0–44)
AST: 36 U/L (ref 15–41)
Albumin: 3.2 g/dL — ABNORMAL LOW (ref 3.5–5.0)
Alkaline Phosphatase: 63 U/L (ref 38–126)
Anion gap: 6 (ref 5–15)
BUN: 10 mg/dL (ref 8–23)
CO2: 29 mmol/L (ref 22–32)
Calcium: 8.6 mg/dL — ABNORMAL LOW (ref 8.9–10.3)
Chloride: 99 mmol/L (ref 98–111)
Creatinine, Ser: 0.68 mg/dL (ref 0.44–1.00)
GFR, Estimated: >60 mL/min (ref >60)
Glucose, Bld: 145 mg/dL — ABNORMAL HIGH (ref 70–99)
Potassium: 3.6 mmol/L (ref 3.5–5.1)
Sodium: 134 mmol/L — ABNORMAL LOW (ref 135–145)
Total Bilirubin: 0.8 mg/dL (ref 0.3–1.2)
Total Protein: 5.5 g/dL — ABNORMAL LOW (ref 6.5–8.1)

## 2021-07-07 LAB — METHOTREXATE: Methotrexate: 0.73

## 2021-07-07 MED ORDER — SODIUM CHLORIDE 0.9 % IV SOLN
8.0000 mg | Freq: Four times a day (QID) | INTRAVENOUS | Status: DC
  Administered 2021-07-07 – 2021-07-09 (×7): 8 mg via INTRAVENOUS
  Filled 2021-07-07 (×9): qty 4

## 2021-07-07 MED ORDER — SODIUM CHLORIDE 0.9 % IV SOLN: 3000.0000 mg | Freq: Two times a day (BID) | INTRAVENOUS | Status: DC

## 2021-07-07 MED ORDER — LEUCOVORIN CALCIUM INJECTION 100 MG
15.0000 mg/m2 | Freq: Four times a day (QID) | INTRAMUSCULAR | Status: AC
  Administered 2021-07-07 – 2021-07-08 (×4): 28 mg via INTRAVENOUS
  Filled 2021-07-07 (×5): qty 1.4

## 2021-07-07 MED ORDER — ACETAMINOPHEN 325 MG PO TABS
650.0000 mg | ORAL_TABLET | Freq: Four times a day (QID) | ORAL | Status: DC | PRN
  Administered 2021-07-07: 650 mg via ORAL
  Filled 2021-07-07: qty 2

## 2021-07-07 NOTE — Progress Notes (Signed)
She is going through chemotherapy.  She is having a little bit more in the way of nausea.  I will put her on scheduled Zofran. ? ?She is done well with the methotrexate.  Level was a bit on the high side yesterday.  We increased her leucovorin rescue.  She has a good renal function.  There is no increase in her LFTs yet. ? ?She is having little bit of diarrhea. ? ?There is no neurological changes that we see from the cytarabine. ? ?She has had no cough.  There is been no shortness of breath.  She has had no mouth sores. ? ?Her hemoglobin is dropping a little bit.  Her hemoglobin is 8.5.  Platelet count 119,000.  White cell count went back up to 5.5. ? ?She did not feel like eating much yesterday.  Maybe she will eat a little bit more today. ? ?She has had no rashes.  Her left hip feels good.  There is no pain in her legs or knees. ? ?Her vital signs are temperature 98.7.  Pulse 98.  Blood pressure 117/55.  Her lungs are clear bilaterally.  Oral exam shows no mucositis.  Cardiac exam regular rate and rhythm.  Abdomen is soft.  Bowel sounds are present.  There is no guarding or rebound tenderness.  Skin exam shows no rashes.  Neurological exam is nonfocal. ? ?She is getting through chemotherapy.  I think she has another dose of cytarabine.  Then she gets thiotepa. ? ?We will have to watch her hemoglobin closely. ? ?I would have to think that if all goes well, we will be able to get her home on Saturday. ? ?I do appreciate the incredible care she is getting from the nurses up on 6 E. ? ?[name], MD ? ?Psalms 37:4 ?

## 2021-07-07 NOTE — Progress Notes (Addendum)
Chemotherapy dosage and calculations verified with Rollene Fare, RN. All neurological assessments WNL prior to cytarabine infusion. No concerns at this time. Will continue to monitor. ?

## 2021-07-07 NOTE — Progress Notes (Signed)
PHARMACY HIGH-DOSE METHOTREXATE MONITORING ? ?Ariel [name] is a 66 yo female admitted to receive the MATRIX protocol.  Patient received high-dose MTX on 5/9. ? ?MTX level obtained this morning = 0.73 ? ?Lab result reported to Dr. Marin Olp.  Verbal orders to adjust Leucovorin rescue to 15 mg/m2 (28 mg) IV q6 hours x 4 doses starting at 1200 today. ? ?Dimple Nanas, PharmD ?07/07/2021 8:52 AM ? ?

## 2021-07-08 ENCOUNTER — Encounter: Payer: Self-pay | Admitting: Hematology & Oncology

## 2021-07-08 LAB — CBC WITH DIFFERENTIAL/PLATELET
Abs Immature Granulocytes: 0.04 10*3/uL (ref 0.00–0.07)
Basophils Absolute: 0 10*3/uL (ref 0.0–0.1)
Basophils Relative: 0 %
Eosinophils Absolute: 0 10*3/uL (ref 0.0–0.5)
Eosinophils Relative: 0 %
HCT: 23.5 % — ABNORMAL LOW (ref 36.0–46.0)
Hemoglobin: 8.1 g/dL — ABNORMAL LOW (ref 12.0–15.0)
Immature Granulocytes: 1 %
Lymphocytes Relative: 1 %
Lymphs Abs: 0 10*3/uL — ABNORMAL LOW (ref 0.7–4.0)
MCH: 31.5 pg (ref 26.0–34.0)
MCHC: 34.5 g/dL (ref 30.0–36.0)
MCV: 91.4 fL (ref 80.0–100.0)
Monocytes Absolute: 0 10*3/uL — ABNORMAL LOW (ref 0.1–1.0)
Monocytes Relative: 0 %
Neutro Abs: 4 10*3/uL (ref 1.7–7.7)
Neutrophils Relative %: 98 %
Platelets: 100 10*3/uL — ABNORMAL LOW (ref 150–400)
RBC: 2.57 MIL/uL — ABNORMAL LOW (ref 3.87–5.11)
RDW: 19.1 % — ABNORMAL HIGH (ref 11.5–15.5)
WBC: 4.1 10*3/uL (ref 4.0–10.5)
nRBC: 0 % (ref 0.0–0.2)

## 2021-07-08 LAB — COMPREHENSIVE METABOLIC PANEL
ALT: 88 U/L — ABNORMAL HIGH (ref 0–44)
AST: 75 U/L — ABNORMAL HIGH (ref 15–41)
Albumin: 3.1 g/dL — ABNORMAL LOW (ref 3.5–5.0)
Alkaline Phosphatase: 59 U/L (ref 38–126)
Anion gap: 6 (ref 5–15)
BUN: 9 mg/dL (ref 8–23)
CO2: 30 mmol/L (ref 22–32)
Calcium: 8.6 mg/dL — ABNORMAL LOW (ref 8.9–10.3)
Chloride: 99 mmol/L (ref 98–111)
Creatinine, Ser: 0.62 mg/dL (ref 0.44–1.00)
GFR, Estimated: >60 mL/min (ref >60)
Glucose, Bld: 176 mg/dL — ABNORMAL HIGH (ref 70–99)
Potassium: 3.2 mmol/L — ABNORMAL LOW (ref 3.5–5.1)
Sodium: 135 mmol/L (ref 135–145)
Total Bilirubin: 0.6 mg/dL (ref 0.3–1.2)
Total Protein: 5.6 g/dL — ABNORMAL LOW (ref 6.5–8.1)

## 2021-07-08 LAB — URINALYSIS, ROUTINE W REFLEX MICROSCOPIC
Bilirubin Urine: NEGATIVE
Glucose, UA: 50 mg/dL — AB
Hgb urine dipstick: NEGATIVE
Ketones, ur: NEGATIVE mg/dL
Leukocytes,Ua: NEGATIVE
Nitrite: NEGATIVE
Protein, ur: 30 mg/dL — AB
Specific Gravity, Urine: 1.013 (ref 1.005–1.030)
pH: 9 — ABNORMAL HIGH (ref 5.0–8.0)

## 2021-07-08 LAB — HEMOGLOBIN AND HEMATOCRIT, BLOOD
HCT: 29.9 % — ABNORMAL LOW (ref 36.0–46.0)
Hemoglobin: 10.7 g/dL — ABNORMAL LOW (ref 12.0–15.0)

## 2021-07-08 LAB — METHOTREXATE: Methotrexate: 0.09

## 2021-07-08 LAB — PREPARE RBC (CROSSMATCH)

## 2021-07-08 MED ORDER — FUROSEMIDE 10 MG/ML IJ SOLN
20.0000 mg | Freq: Once | INTRAMUSCULAR | Status: AC
  Administered 2021-07-08: 20 mg via INTRAVENOUS
  Filled 2021-07-08: qty 2

## 2021-07-08 MED ORDER — SODIUM CHLORIDE 0.9 % IV SOLN
8.0000 mg | Freq: Once | INTRAVENOUS | Status: AC
  Administered 2021-07-08: 8 mg via INTRAVENOUS
  Filled 2021-07-08: qty 8

## 2021-07-08 MED ORDER — POTASSIUM CHLORIDE CRYS ER 20 MEQ PO TBCR
40.0000 meq | EXTENDED_RELEASE_TABLET | Freq: Once | ORAL | Status: AC
  Administered 2021-07-08: 40 meq via ORAL
  Filled 2021-07-08: qty 2

## 2021-07-08 MED ORDER — THIOTEPA CHEMO INJECTION 15 MG
54.0000 mg | Freq: Once | INTRAVENOUS | Status: AC
  Administered 2021-07-08: 54 mg via INTRAVENOUS
  Filled 2021-07-08: qty 5.19

## 2021-07-08 MED ORDER — SODIUM CHLORIDE 0.9% IV SOLUTION: Freq: Once | INTRAVENOUS | Status: AC

## 2021-07-08 NOTE — Consult Note (Signed)
[facility] CM Inpatient Consult ? ? ?07/08/2021 ? ?Brand Males ?06-14-1955 ?[id number] ? ?[place] Management [facility] CM) ?  ?Patient chart has been reviewed with noted high risk score for unplanned readmissions.  Patient assessed for community [place] Management follow up needs. Per review, patient resides at [facility], [place]. Patient is followed by oncology team. No needs for [facility] CM identified at this time. ?  ?Of note, [facility] Care Management services does not replace or interfere with any services that are arranged by inpatient case management or social work.  ?  ?Netta Cedars, MSN, RN ?[facility] Liaison ?Phone [phone] ?Toll free office [phone]   ? ?

## 2021-07-08 NOTE — Progress Notes (Signed)
Thiotepa Chemotherapy dosage and calculations verified with Rollene Fare, Therapist, sports. No concerns at this time. Will continue to monitor. ?

## 2021-07-08 NOTE — Care Management Important Message (Signed)
Important Message ? ?Patient Details IM Letter given to the Patient. ?Name: Ariel [name] ?MRN: [id number] ?Date of Birth: 06-22-1955 ? ? ?Medicare Important Message Given:  Yes ? ? ? ? ?[name] ?07/08/2021, 11:40 AM ?

## 2021-07-08 NOTE — Progress Notes (Addendum)
PHARMACY HIGH-DOSE METHOTREXATE MONITORING ? ?Ariel [name] is a 66 yo female admitted to receive the MATRIX protocol.  Patient received high-dose MTX on 5/9. ? ?MTX level obtained this morning = 0.09 ? ?Lab result reported to Dr. Marin Olp.  Verbal orders to discontinue Leucovorin rescue. Will discontinue daily methotrexate lab draw. Per MD, continue bicarbonate fluids for one more day.  ? ?Ariel [name], PharmD ?07/08/2021 8:54 AM ? ?

## 2021-07-09 LAB — BPAM RBC
Blood Product Expiration Date: 202306032359
Blood Product Expiration Date: 202306032359
ISSUE DATE / TIME: 202305120815
ISSUE DATE / TIME: 202305121725
Unit Type and Rh: 6200
Unit Type and Rh: 6200

## 2021-07-09 LAB — COMPREHENSIVE METABOLIC PANEL
ALT: 115 U/L — ABNORMAL HIGH (ref 0–44)
AST: 84 U/L — ABNORMAL HIGH (ref 15–41)
Albumin: 3.2 g/dL — ABNORMAL LOW (ref 3.5–5.0)
Alkaline Phosphatase: 56 U/L (ref 38–126)
Anion gap: 7 (ref 5–15)
BUN: 14 mg/dL (ref 8–23)
CO2: 34 mmol/L — ABNORMAL HIGH (ref 22–32)
Calcium: 8.8 mg/dL — ABNORMAL LOW (ref 8.9–10.3)
Chloride: 96 mmol/L — ABNORMAL LOW (ref 98–111)
Creatinine, Ser: 0.64 mg/dL (ref 0.44–1.00)
GFR, Estimated: >60 mL/min (ref >60)
Glucose, Bld: 121 mg/dL — ABNORMAL HIGH (ref 70–99)
Potassium: 3.4 mmol/L — ABNORMAL LOW (ref 3.5–5.1)
Sodium: 137 mmol/L (ref 135–145)
Total Bilirubin: 0.5 mg/dL (ref 0.3–1.2)
Total Protein: 5.8 g/dL — ABNORMAL LOW (ref 6.5–8.1)

## 2021-07-09 LAB — TYPE AND SCREEN
ABO/RH(D): A POS
Antibody Screen: NEGATIVE
Unit division: 0
Unit division: 0

## 2021-07-09 LAB — URINALYSIS, ROUTINE W REFLEX MICROSCOPIC
Bilirubin Urine: NEGATIVE
Glucose, UA: NEGATIVE mg/dL
Hgb urine dipstick: NEGATIVE
Ketones, ur: NEGATIVE mg/dL
Leukocytes,Ua: NEGATIVE
Nitrite: NEGATIVE
Protein, ur: NEGATIVE mg/dL
Specific Gravity, Urine: 1.008 (ref 1.005–1.030)
pH: 9 — ABNORMAL HIGH (ref 5.0–8.0)

## 2021-07-09 MED ORDER — FLUCONAZOLE 100 MG PO TABS
100.0000 mg | ORAL_TABLET | Freq: Every day | ORAL | Status: DC
  Administered 2021-07-09: 100 mg via ORAL
  Filled 2021-07-09: qty 1

## 2021-07-09 MED ORDER — CIPROFLOXACIN HCL 500 MG PO TABS
500.0000 mg | ORAL_TABLET | Freq: Every day | ORAL | Status: DC
  Administered 2021-07-09: 500 mg via ORAL
  Filled 2021-07-09: qty 1

## 2021-07-09 MED ORDER — CIPROFLOXACIN HCL 500 MG PO TABS: 500.0000 mg | ORAL_TABLET | Freq: Every day | ORAL | 4 refills | Status: DC

## 2021-07-09 MED ORDER — FLUCONAZOLE 100 MG PO TABS: 100.0000 mg | ORAL_TABLET | Freq: Every day | ORAL | 4 refills | Status: DC

## 2021-07-09 MED ORDER — HEPARIN SOD (PORK) LOCK FLUSH 100 UNIT/ML IV SOLN
500.0000 [IU] | Freq: Once | INTRAVENOUS | Status: AC
  Administered 2021-07-09: 500 [IU] via INTRAVENOUS
  Filled 2021-07-09: qty 5

## 2021-07-09 NOTE — Discharge Summary (Signed)
Physician Discharge Summary  ?Patient ID: ?Ariel [name] ?'@[email or web address]'$ @ ?MRN: [id number] ?DOB/AGE: Jun 28, 1955 66 y.o. ? ?Admit date: 07/04/2021 ?Discharge date: 07/09/2021 ? ?Admission Diagnoses: ?Diffuse large B cell non-Hodgkin's lymphoma-status post cycle #3 of chemotherapy with MATRIX ? ?Discharge Diagnoses:  ?Principal Problem: ?  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  Encounter for antineoplastic chemotherapy ? ? ?Discharged Condition: good ? ?Discharge Labs:  ? ?Significant Diagnostic Studies: None ? ?Consults: None ? ?Disposition: Discharge disposition: 01-Home or Self Care ? ? ? ? ? ? ?Treatments: Chemotherapy as in the medical record ? ?Allergies as of 07/09/2021   ? ?   Reactions  ? Decadron [dexamethasone] Other (See Comments)  ? Made the patient's legs tired and she could not walk  ? Plavix [clopidogrel] Hives, Swelling, Other (See Comments)  ? Lips became swollen  ? Prednisone Other (See Comments)  ? Steroids caused psychological issues   ? Codeine Nausea Only  ? ?  ? ?  ?Medication List  ?  ? ?TAKE these medications   ? ?acetaminophen 500 MG tablet ?Commonly known as: TYLENOL ?Take 500-1,000 mg by mouth every 6 (six) hours as needed for mild pain or headache. ?  ?acyclovir 200 MG capsule ?Commonly known as: ZOVIRAX ?Take 1 capsule (200 mg total) by mouth 2 (two) times daily. ?What changed: additional instructions ?  ?antiseptic oral rinse Liqd ?15 mLs by Mouth Rinse route every 4 (four) hours. ?What changed:  ?when to take this ?reasons to take this ?  ?ciprofloxacin 500 MG tablet ?Commonly known as: CIPRO ?Take 1 tablet (500 mg total) by mouth daily with breakfast. ?  ?docusate sodium 100 MG capsule ?Commonly known as: COLACE ?Take 1 capsule (100 mg total) by mouth 2 (two) times daily as needed for mild constipation. ?  ?fluconazole 100 MG tablet ?Commonly known as: DIFLUCAN ?Take 1 tablet (100 mg total) by mouth daily. ?  ?OLANZapine 5 MG tablet ?Commonly known as: ZYPREXA ?Take 1  tablet (5 mg total) by mouth daily. ?  ?ondansetron 8 MG tablet ?Commonly known as: ZOFRAN ?Take 1 tablet (8 mg total) by mouth every 8 (eight) hours as needed for nausea or vomiting. ?  ?OVER THE COUNTER MEDICATION ?Take 3 capsules by mouth See admin instructions. Balance of Nature VEGETABLE formulation capsules- Take 3 capsules by mouth every morning ?  ?OVER THE COUNTER MEDICATION ?Take 3 capsules by mouth See admin instructions. Balance of Nature FRUIT formulation capsules- Take 3 capsules by mouth every morning ?  ?traMADol 50 MG tablet ?Commonly known as: ULTRAM ?Take one (50 mg) to two (100 mg) tablets every six hours as needed for pain ?What changed: See the new instructions. ?  ?Vuity 1.25 % Soln ?Generic drug: Pilocarpine HCl ?Place 1 drop into both eyes in the morning and at bedtime. ?  ? ?  ? ? ? Follow-up Information   ? ? Volanda Napoleon, MD Follow up in 2 day(s).   ?Specialty: Oncology ?Why: Keep your appointment as already scheduled. ?Contact information: ?[address] ?[address] ?[place] [address] ?[phone] ? ? ?  ?  ? ?  ?  ? ?  ? ? ?Hospital Course: [name] was admitted on 07/04/2021.  This was her third cycle of chemotherapy with MATRIX.  She had her Port-A-Cath accessed.  She was started on IV fluids.  We gave her sodium bicarb in the IV fluids to get a urine alkalize. ? ?She received Rituxan the first day.  The second day, and  she received high-dose methotrexate.  She was then put on leucovorin rescue.  She received this every 6 hours.  Her initial methotrexate level was 7.9.  We doubled the dose of leucovorin and her level next day was down to 0.7. ? ?As always, and she managed to excrete the leucovorin very quickly..  She then received high-dose cytarabine.  We gave her Decadron eyedrops for this to help protect her eyes.  She had no problems with the Decadron eyedrops.  She had no problems with the cytarabine. ? ?She did have some nausea.  We put Zofran on a scheduled for  her. ? ?She then received her dose of thioTEPA.  She got this when she was getting a blood transfusion.  I transfuse her with her hemoglobin of 8.1. ? ?The blood transfusion helped her quite a bit. ? ?She had no problems with diarrhea. ? ?The arthritis in the left hip was not much of a problem for her.  She has avascular necrosis. ? ?She was eating a little bit better on the morning of discharge. ? ?Her labs look good.  Her liver function studies are always on the elevated side after the methotrexate. ? ? ? ? ? ?Discharge Exam: ?Blood pressure (!) 116/54, pulse 63, temperature 98.2 ?F (36.8 ?C), temperature source Oral, resp. rate 14, height '5\' 7"'$  (1.702 m), SpO2 96 %. ?Physical Exam ?Vitals reviewed.  ?HENT:  ?   Head: Normocephalic and atraumatic.  ?Eyes:  ?   Pupils: Pupils are equal, round, and reactive to light.  ?Cardiovascular:  ?   Rate and Rhythm: Normal rate and regular rhythm.  ?   Heart sounds: Normal heart sounds.  ?Pulmonary:  ?   Effort: Pulmonary effort is normal.  ?   Breath sounds: Normal breath sounds.  ?Abdominal:  ?   General: Bowel sounds are normal.  ?   Palpations: Abdomen is soft.  ?Musculoskeletal:     ?   General: No tenderness or deformity. Normal range of motion.  ?   Cervical back: Normal range of motion.  ?Lymphadenopathy:  ?   Cervical: No cervical adenopathy.  ?Skin: ?   General: Skin is warm and dry.  ?   Findings: No erythema or rash.  ?Neurological:  ?   Mental Status: She is alert and oriented to person, place, and time.  ?Psychiatric:     ?   Behavior: Behavior normal.     ?   Thought Content: Thought content normal.     ?   Judgment: Judgment normal.  ? ? ? ? ? ?Signed: ?Volanda Napoleon ?07/09/2021, 7:34 AM  ?

## 2021-07-09 NOTE — Plan of Care (Signed)
Patient ID: Ariel [name], female   DOB: [birth year], 66 y.o.   MRN: [id number] ? ?Problem: Education: ?Goal: Knowledge of General Education information will improve ?Description: Including pain rating scale, medication(s)/side effects and non-pharmacologic comfort measures ?Outcome: Adequate for Discharge ?  ?Problem: Health Behavior/Discharge Planning: ?Goal: Ability to manage health-related needs will improve ?Outcome: Adequate for Discharge ?  ?Problem: Clinical Measurements: ?Goal: Ability to maintain clinical measurements within normal limits will improve ?Outcome: Adequate for Discharge ?Goal: Will remain free from infection ?Outcome: Adequate for Discharge ?Goal: Diagnostic test results will improve ?Outcome: Adequate for Discharge ?Goal: Respiratory complications will improve ?Outcome: Adequate for Discharge ?Goal: Cardiovascular complication will be avoided ?Outcome: Adequate for Discharge ?  ?Problem: Activity: ?Goal: Risk for activity intolerance will decrease ?Outcome: Adequate for Discharge ?  ?Problem: Nutrition: ?Goal: Adequate nutrition will be maintained ?Outcome: Adequate for Discharge ?  ?Problem: Coping: ?Goal: Level of anxiety will decrease ?Outcome: Adequate for Discharge ?  ?Problem: Elimination: ?Goal: Will not experience complications related to bowel motility ?Outcome: Adequate for Discharge ?Goal: Will not experience complications related to urinary retention ?Outcome: Adequate for Discharge ?  ?Problem: Pain Managment: ?Goal: General experience of comfort will improve ?Outcome: Adequate for Discharge ?  ?Problem: Safety: ?Goal: Ability to remain free from injury will improve ?Outcome: Adequate for Discharge ?  ?Problem: Skin Integrity: ?Goal: Risk for impaired skin integrity will decrease ?Outcome: Adequate for Discharge ?  ? ?Haydee Salter, RN ? ?

## 2021-07-11 ENCOUNTER — Inpatient Hospital Stay (HOSPITAL_BASED_OUTPATIENT_CLINIC_OR_DEPARTMENT_OTHER): Payer: Medicare Other | Admitting: Hematology & Oncology

## 2021-07-11 ENCOUNTER — Inpatient Hospital Stay: Payer: Medicare Other

## 2021-07-11 ENCOUNTER — Encounter: Payer: Self-pay | Admitting: Hematology & Oncology

## 2021-07-11 VITALS — BP 115/47 | HR 70 | Temp 98.1°F | Resp 20 | Wt 152.1 lb

## 2021-07-11 DIAGNOSIS — C8510 Unspecified B-cell lymphoma, unspecified site: Secondary | ICD-10-CM

## 2021-07-11 DIAGNOSIS — Z79899 Other long term (current) drug therapy: Secondary | ICD-10-CM | POA: Diagnosis not present

## 2021-07-11 DIAGNOSIS — D509 Iron deficiency anemia, unspecified: Secondary | ICD-10-CM | POA: Diagnosis not present

## 2021-07-11 DIAGNOSIS — Z9221 Personal history of antineoplastic chemotherapy: Secondary | ICD-10-CM | POA: Diagnosis not present

## 2021-07-11 DIAGNOSIS — D500 Iron deficiency anemia secondary to blood loss (chronic): Secondary | ICD-10-CM

## 2021-07-11 DIAGNOSIS — C8330 Diffuse large B-cell lymphoma, unspecified site: Secondary | ICD-10-CM | POA: Diagnosis not present

## 2021-07-11 LAB — CBC WITH DIFFERENTIAL (CANCER CENTER ONLY)
Abs Immature Granulocytes: 0.01 10*3/uL (ref 0.00–0.07)
Basophils Absolute: 0 10*3/uL (ref 0.0–0.1)
Basophils Relative: 0 %
Eosinophils Absolute: 0 10*3/uL (ref 0.0–0.5)
Eosinophils Relative: 0 %
HCT: 31.1 % — ABNORMAL LOW (ref 36.0–46.0)
Hemoglobin: 10.7 g/dL — ABNORMAL LOW (ref 12.0–15.0)
Immature Granulocytes: 1 %
Lymphocytes Relative: 8 %
Lymphs Abs: 0.1 10*3/uL — ABNORMAL LOW (ref 0.7–4.0)
MCH: 31 pg (ref 26.0–34.0)
MCHC: 34.4 g/dL (ref 30.0–36.0)
MCV: 90.1 fL (ref 80.0–100.0)
Monocytes Absolute: 0 10*3/uL — ABNORMAL LOW (ref 0.1–1.0)
Monocytes Relative: 0 %
Neutro Abs: 1.1 10*3/uL — ABNORMAL LOW (ref 1.7–7.7)
Neutrophils Relative %: 91 %
Platelet Count: 55 10*3/uL — ABNORMAL LOW (ref 150–400)
RBC: 3.45 MIL/uL — ABNORMAL LOW (ref 3.87–5.11)
RDW: 16.9 % — ABNORMAL HIGH (ref 11.5–15.5)
WBC Count: 1.2 10*3/uL — ABNORMAL LOW (ref 4.0–10.5)
nRBC: 0 % (ref 0.0–0.2)

## 2021-07-11 LAB — CMP (CANCER CENTER ONLY)
ALT: 103 U/L — ABNORMAL HIGH (ref 0–44)
AST: 55 U/L — ABNORMAL HIGH (ref 15–41)
Albumin: 4.1 g/dL (ref 3.5–5.0)
Alkaline Phosphatase: 69 U/L (ref 38–126)
Anion gap: 7 (ref 5–15)
BUN: 22 mg/dL (ref 8–23)
CO2: 29 mmol/L (ref 22–32)
Calcium: 9.7 mg/dL (ref 8.9–10.3)
Chloride: 100 mmol/L (ref 98–111)
Creatinine: 0.71 mg/dL (ref 0.44–1.00)
GFR, Estimated: >60 mL/min (ref >60)
Glucose, Bld: 98 mg/dL (ref 70–99)
Potassium: 4.3 mmol/L (ref 3.5–5.1)
Sodium: 136 mmol/L (ref 135–145)
Total Bilirubin: 0.5 mg/dL (ref 0.3–1.2)
Total Protein: 6.2 g/dL — ABNORMAL LOW (ref 6.5–8.1)

## 2021-07-11 LAB — LACTATE DEHYDROGENASE: LDH: 230 U/L — ABNORMAL HIGH (ref 98–192)

## 2021-07-11 LAB — SAMPLE TO BLOOD BANK

## 2021-07-11 MED ORDER — PEGFILGRASTIM-BMEZ 6 MG/0.6ML ~~LOC~~ SOSY
6.0000 mg | PREFILLED_SYRINGE | Freq: Once | SUBCUTANEOUS | Status: AC
  Administered 2021-07-11: 6 mg via SUBCUTANEOUS
  Filled 2021-07-11: qty 0.6

## 2021-07-11 NOTE — Patient Instructions (Signed)

## 2021-07-11 NOTE — Progress Notes (Signed)
?Hematology and Oncology Follow Up Visit ? ?Ariel [name] ?[id number] ?March 10, 1955 66 y.o. ?07/11/2021 ? ? ?Principle Diagnosis:  ?Diffuse large cell non-Hodgkin's lymphoma-CNS involvement ? ?Current Therapy:   ?Status post chemotherapy with R-ICE/R-MTV --  s/p cycle #4 ?MATRIX -- s/p cycle #3 -- start on 05/02/2021 ?Neulasta 6 mg subcu post chemotherapy ?    ?Interim History:  Ariel [name] is comes in for follow-up.  She was just discharged from the hospital on 07/09/2021.  She was then went for her third cycle of MATRIX.  She tolerated this quite well.  As always, her liver test to go up with the methotrexate.  However, they do come back down. ? ?Her blood counts are on the way back down.  She will get her Neulasta today. ? ?Her other problem is the left hip.  She has avascular necrosis of the left hip.  At some point, she will need surgery for this. ? ?She has had a good appetite.  She had a very nice Mother's Day yesterday. ? ?She has had no cough or shortness of breath.  There is been no nausea or vomiting.  She has had no rashes.  There is been no bleeding.  She has had no fever..  She is on antibiotic prophylaxis with ciprofloxacin, Famvir, and Diflucan. ? ?Overall, I would say performance status right now is ECOG 1.   ? ?Medications:  ?Current Outpatient Medications:  ?  acetaminophen (TYLENOL) 500 MG tablet, Take 500-1,000 mg by mouth every 6 (six) hours as needed for mild pain or headache., Disp: , Rfl:  ?  acyclovir (ZOVIRAX) 200 MG capsule, Take 1 capsule (200 mg total) by mouth 2 (two) times daily. (Patient taking differently: Take 200 mg by mouth 2 (two) times daily. continuous), Disp: 60 capsule, Rfl: 3 ?  antiseptic oral rinse (BIOTENE) LIQD, 15 mLs by Mouth Rinse route every 4 (four) hours. (Patient taking differently: 15 mLs by Mouth Rinse route every 4 (four) hours as needed for dry mouth or mouth pain.), Disp: 500 mL, Rfl: 4 ?  docusate sodium (COLACE) 100 MG capsule, Take 1 capsule (100 mg total) by  mouth 2 (two) times daily as needed for mild constipation., Disp: 10 capsule, Rfl: 0 ?  fluconazole (DIFLUCAN) 100 MG tablet, Take 1 tablet (100 mg total) by mouth daily., Disp: 15 tablet, Rfl: 4 ?  OLANZapine (ZYPREXA) 5 MG tablet, Take 1 tablet (5 mg total) by mouth daily., Disp: 30 tablet, Rfl: 4 ?  OVER THE COUNTER MEDICATION, Take 3 capsules by mouth See admin instructions. Balance of Nature VEGETABLE formulation capsules- Take 3 capsules by mouth every morning, Disp: , Rfl:  ?  OVER THE COUNTER MEDICATION, Take 3 capsules by mouth See admin instructions. Balance of Nature FRUIT formulation capsules- Take 3 capsules by mouth every morning, Disp: , Rfl:  ?  traMADol (ULTRAM) 50 MG tablet, Take one (50 mg) to two (100 mg) tablets every six hours as needed for pain (Patient taking differently: Take 50-100 mg by mouth every 6 (six) hours as needed for moderate pain.), Disp: 60 tablet, Rfl: 0 ?  VUITY 1.25 % SOLN, Place 1 drop into both eyes in the morning and at bedtime., Disp: , Rfl:  ?  ondansetron (ZOFRAN) 8 MG tablet, Take 1 tablet (8 mg total) by mouth every 8 (eight) hours as needed for nausea or vomiting. (Patient not taking: Reported on 07/11/2021), Disp: 30 tablet, Rfl: 3 ?No current facility-administered medications for this visit. ? ?Facility-Administered Medications Ordered in  Other Visits:  ?  pegfilgrastim-bmez (ZIEXTENZO) injection 6 mg, 6 mg, Subcutaneous, Once, Yancey Pedley, Rudell Cobb, MD ? ?Allergies:  ?Allergies  ?Allergen Reactions  ? Decadron [Dexamethasone] Other (See Comments)  ?  Made the patient's legs tired and she could not walk  ? Plavix [Clopidogrel] Hives, Swelling and Other (See Comments)  ?  Lips became swollen ?  ? Prednisone Other (See Comments)  ?  Steroids caused psychological issues   ? Codeine Nausea Only  ? ? ?Past Medical History, Surgical history, Social history, and Family History were reviewed and updated. ? ?Review of Systems: ?Review of Systems  ?Constitutional: Negative.    ?HENT:  Negative.    ?Eyes: Negative.   ?Respiratory: Negative.    ?Cardiovascular: Negative.   ?Gastrointestinal: Negative.   ?Endocrine: Negative.   ?Genitourinary: Negative.    ?Musculoskeletal: Negative.   ?Skin: Negative.   ?Neurological: Negative.   ?Hematological: Negative.   ?Psychiatric/Behavioral: Negative.    ? ?Physical Exam: ? weight is 152 lb 1.9 oz (69 kg). Her oral temperature is 98.1 ?F (36.7 ?C). Her blood pressure is 115/47 (abnormal) and her pulse is 70. Her respiration is 20 and oxygen saturation is 100%.  ? ?Wt Readings from Last 3 Encounters:  ?07/11/21 152 lb 1.9 oz (69 kg)  ?06/30/21 154 lb (69.9 kg)  ?06/23/21 154 lb (69.9 kg)  ? ? ?Physical Exam ?Vitals reviewed.  ?HENT:  ?   Head: Normocephalic and atraumatic.  ?   Mouth/Throat:  ?   Comments: Oral exam shows multiple sites of gingival bleeding.  She has gauze in her mouth to try to help with the bleeding.  There is no obvious gingival breakdown. ?Eyes:  ?   Pupils: Pupils are equal, round, and reactive to light.  ?Cardiovascular:  ?   Rate and Rhythm: Normal rate and regular rhythm.  ?   Heart sounds: Normal heart sounds.  ?   Comments: Cardiac exam is regular rate and rhythm with no murmurs, rubs or bruits. ?Pulmonary:  ?   Effort: Pulmonary effort is normal.  ?   Breath sounds: Normal breath sounds.  ?Abdominal:  ?   General: Bowel sounds are normal.  ?   Palpations: Abdomen is soft.  ?Musculoskeletal:     ?   General: No tenderness or deformity. Normal range of motion.  ?   Cervical back: Normal range of motion.  ?   Comments: Examination of her left hip shows little bit of tenderness to palpation over the anterior portion of the left hip.  She seems to have decent range of motion.  She has no swelling.  ?Lymphadenopathy:  ?   Cervical: No cervical adenopathy.  ?Skin: ?   General: Skin is warm and dry.  ?   Findings: No erythema or rash.  ?   Comments: Skin exam does show petechia on her lower legs.  ?Neurological:  ?   Mental  Status: She is alert and oriented to person, place, and time.  ?   Comments: Neurological exam shows no focal neurological deficits.  All cranial nerves are intact.  She has good strength in upper and lower extremities.  ?Psychiatric:     ?   Behavior: Behavior normal.     ?   Thought Content: Thought content normal.     ?   Judgment: Judgment normal.  ? ? ? ?Lab Results  ?Component Value Date  ? WBC 1.2 (L) 07/11/2021  ? HGB 10.7 (L) 07/11/2021  ? HCT 31.1 (L)  07/11/2021  ? MCV 90.1 07/11/2021  ? PLT 55 (L) 07/11/2021  ? ?  Chemistry   ?   ?Component Value Date/Time  ? NA 137 07/09/2021 0523  ? K 3.4 (L) 07/09/2021 0523  ? CL 96 (L) 07/09/2021 0523  ? CO2 34 (H) 07/09/2021 0523  ? BUN 14 07/09/2021 0523  ? CREATININE 0.64 07/09/2021 0523  ? CREATININE 0.80 06/30/2021 1310  ? CREATININE 0.68 11/18/2012 1439  ?    ?Component Value Date/Time  ? CALCIUM 8.8 (L) 07/09/2021 0523  ? ALKPHOS 56 07/09/2021 0523  ? AST 84 (H) 07/09/2021 0523  ? AST 22 06/30/2021 1310  ? ALT 115 (H) 07/09/2021 0523  ? ALT 21 06/30/2021 1310  ? BILITOT 0.5 07/09/2021 0523  ? BILITOT 0.4 06/30/2021 1310  ?  ? ? ?Impression and Plan: ?Ms. Trovato is a very charming 66 year old white female.  She initially came to [place] with iron deficiency anemia.  We subsequently found that she had diffuse large cell non-Hodgkin's lymphoma.  It had traveled to her brain. ? ?She has had 4 cycles of treatment.  We basically gave her R-ICE alternating with R-MTV.  She tolerated this very well. ? ?She has had 3 cycles of the MATRIX protocol. ? ?Right now, we will go ahead and check her blood counts twice a week.  We will have her come back Thursday and then Monday and Thursday next week. ? ?[place].  Her daughter and family are coming up from [place] for a month.  She wants to be with them for a week before she gets admitted for her fourth cycle of MATRIX. ? ?I will plan to see her back on 25 May. ? ? ?Volanda Napoleon,  MD ?5/15/20239:57 AM  ?

## 2021-07-12 ENCOUNTER — Other Ambulatory Visit: Payer: Self-pay | Admitting: Hematology & Oncology

## 2021-07-12 ENCOUNTER — Encounter: Payer: Self-pay | Admitting: *Deleted

## 2021-07-12 NOTE — Progress Notes (Signed)
Patient seen in follow up in the office after hospital discharge. She is scheduled for twice weekly lab checks. She will be admitted for one more cycle of MATRIX in June.  ? ?Oncology Nurse Navigator Documentation ? ? ?  07/12/2021  ?  8:00 AM  ?Oncology Nurse Navigator Flowsheets  ?Navigator Follow Up Date: 07/21/2021  ?Navigator Follow Up Reason: Follow-up Appointment  ?Navigator Location [facility]  ?Navigator Encounter Type Appt/Treatment Plan Review  ?Patient Visit Type MedOnc  ?Treatment Phase Active Tx  ?Barriers/Navigation Needs Coordination of Care;Education  ?Interventions None Required  ?Acuity Level 2-Minimal Needs (1-2 Barriers Identified)  ?Support Groups/Services Friends and Family  ?Time Spent with Patient 15  ?  ?

## 2021-07-13 ENCOUNTER — Ambulatory Visit (INDEPENDENT_AMBULATORY_CARE_PROVIDER_SITE_OTHER): Payer: Medicare Other

## 2021-07-13 DIAGNOSIS — Z0000 Encounter for general adult medical examination without abnormal findings: Secondary | ICD-10-CM | POA: Diagnosis not present

## 2021-07-13 NOTE — Progress Notes (Addendum)
Virtual Visit via Telephone Note ? ?I connected with  Ariel [name] on 07/13/21 at  8:30 AM EDT by telephone and verified that I am speaking with the correct person using two identifiers. ? ?Medicare Annual Wellness visit completed telephonically due to Covid-19 pandemic.  ? ?Persons participating in this call: This Health Coach and this patient.  ? ?Location: ?Patient: home ?Provider: office ?  ?I discussed the limitations, risks, security and privacy concerns of performing an evaluation and management service by telephone and the availability of in person appointments. The patient expressed understanding and agreed to proceed. ? ?Unable to perform video visit due to video visit attempted and failed and/or patient does not have video capability.  ? ?Some vital signs may be absent or patient reported.  ? ?Ariel Brace, LPN ? ? ?Subjective:  ? Ariel [name] is a 66 y.o. female who presents for Medicare Annual (Subsequent) preventive examination. ? ?Review of Systems    ? ?Cardiac Risk Factors include: advanced age (>21mn, >>61women);dyslipidemia ? ?   ?Objective:  ?  ?There were no vitals filed for this visit. ?There is no height or weight on file to calculate BMI. ? ? ?  07/13/2021  ?  8:31 AM 07/11/2021  ?  9:44 AM 07/04/2021  ? 11:00 PM 07/04/2021  ?  5:00 PM 06/30/2021  ?  1:26 PM 06/23/2021  ?  2:02 PM 06/20/2021  ?  8:16 AM  ?Advanced Directives  ?Does Patient Have a Medical Advance Directive? Yes Yes  Yes Yes Yes Yes  ?Type of Advance Directive Living will  Living will;Healthcare Power of Attorney  Living will;Healthcare Power of Attorney Living will;Healthcare Power of Attorney Living will;Healthcare Power of Attorney  ?Does patient want to make changes to medical advance directive?   No - Patient declined  No - Patient declined No - Patient declined   ?Copy of [place]?       No - copy requested  ?Would patient like information on creating a medical advance directive?       No - Patient  declined  ? ? ?Current Medications (verified) ?Outpatient Encounter Medications as of 07/13/2021  ?Medication Sig  ? acetaminophen (TYLENOL) 500 MG tablet Take 500-1,000 mg by mouth every 6 (six) hours as needed for mild pain or headache.  ? acyclovir (ZOVIRAX) 200 MG capsule TAKE 1 CAPSULE TWICE A DAY.  ? antiseptic oral rinse (BIOTENE) LIQD 15 mLs by Mouth Rinse route every 4 (four) hours. (Patient taking differently: 15 mLs by Mouth Rinse route every 4 (four) hours as needed for dry mouth or mouth pain.)  ? docusate sodium (COLACE) 100 MG capsule Take 1 capsule (100 mg total) by mouth 2 (two) times daily as needed for mild constipation.  ? fluconazole (DIFLUCAN) 100 MG tablet Take 1 tablet (100 mg total) by mouth daily.  ? ondansetron (ZOFRAN) 8 MG tablet Take 1 tablet (8 mg total) by mouth every 8 (eight) hours as needed for nausea or vomiting.  ? OVER THE COUNTER MEDICATION Take 3 capsules by mouth See admin instructions. Balance of Nature VEGETABLE formulation capsules- Take 3 capsules by mouth every morning  ? OVER THE COUNTER MEDICATION Take 3 capsules by mouth See admin instructions. Balance of Nature FRUIT formulation capsules- Take 3 capsules by mouth every morning  ? traMADol (ULTRAM) 50 MG tablet Take one (50 mg) to two (100 mg) tablets every six hours as needed for pain (Patient taking differently: Take 50-100 mg by  mouth every 6 (six) hours as needed for moderate pain.)  ? VUITY 1.25 % SOLN Place 1 drop into both eyes in the morning and at bedtime.  ? [DISCONTINUED] OLANZapine (ZYPREXA) 5 MG tablet Take 1 tablet (5 mg total) by mouth daily.  ? ?No facility-administered encounter medications on file as of 07/13/2021.  ? ? ?Allergies (verified) ?Decadron [dexamethasone], Plavix [clopidogrel], Prednisone, and Codeine  ? ?History: ?Past Medical History:  ?Diagnosis Date  ? Chicken pox   ? Complication of anesthesia   ? Per patient, very slow to wake up after anesthesia  ? Goals of care,  counseling/discussion 12/22/2020  ? High cholesterol   ? 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 Hyperglycemia 11/10/2020  ? Melanoma ([place]) 2021  ? right hip  ? Melanoma ([place]) 10/28/2020  ? pt stated brain liver and bladder  ? PONV (postoperative nausea and vomiting)   ? Urinary incontinence   ? ?Past Surgical History:  ?Procedure Laterality Date  ? AIR/FLUID EXCHANGE Right 12/07/2020  ? Procedure: AIR/FLUID EXCHANGE;  Surgeon: Jalene Mullet, MD;  Location: [facility];  Service: Ophthalmology;  Laterality: Right;  ? APPENDECTOMY  1962  ? BIOPSY  12/14/2020  ? Procedure: BIOPSY;  Surgeon: Irving Copas., MD;  Location: [facility];  Service: Gastroenterology;;  ? ESOPHAGOGASTRODUODENOSCOPY (EGD) WITH PROPOFOL N/A 12/14/2020  ? Procedure: ESOPHAGOGASTRODUODENOSCOPY (EGD) WITH PROPOFOL;  Surgeon: Rush Landmark Telford Nab., MD;  Location: [facility];  Service: Gastroenterology;  Laterality: N/A;  ? EXCISION MELANOMA WITH SENTINEL LYMPH NODE BIOPSY Right 10/09/2019  ? Procedure: WIDE LOCAL EXCISION WITH ADVANCEMENT FLAP CLOSURE RIGHT HIP MELANOMA WITH SENTINEL LYMPH NODE BIOPSY;  Surgeon: Stark Klein, MD;  Location: [facility];  Service: General;  Laterality: Right;  ? FINE NEEDLE ASPIRATION  12/14/2020  ? Procedure: FINE NEEDLE ASPIRATION (FNA) LINEAR;  Surgeon: Irving Copas., MD;  Location: [facility];  Service: Gastroenterology;;  ? HERNIA REPAIR  2009  ? IR IMAGING GUIDED PORT INSERTION  12/30/2020  ? MOUTH SURGERY  11/2015  ? gum surgery and bone implant  ? PARS PLANA VITRECTOMY Right 12/07/2020  ? Procedure: PARS PLANA VITRECTOMY WITH 25 GAUGE;  Surgeon: Jalene Mullet, MD;  Location: [facility];  Service: Ophthalmology;  Laterality: Right;  ? PHOTOCOAGULATION WITH LASER Right 12/07/2020  ? Procedure: PHOTOCOAGULATION WITH ENDOLASER PANRITINAL COAGULATION;  Surgeon: Jalene Mullet, MD;  Location: [facility];  Service: Ophthalmology;  Laterality: Right;  ? UPPER ESOPHAGEAL ENDOSCOPIC ULTRASOUND (EUS)  N/A 12/14/2020  ? Procedure: UPPER ESOPHAGEAL ENDOSCOPIC ULTRASOUND (EUS);  Surgeon: Irving Copas., MD;  Location: [facility];  Service: Gastroenterology;  Laterality: N/A;  ? WISDOM TOOTH EXTRACTION    ? ?Family History  ?Problem Relation Age of Onset  ? Coronary artery disease Father   ?     died at 50  ? COPD Father   ? Alcohol abuse Father   ? Diabetes Mellitus II Father   ? Coronary artery disease Sister   ? Cancer Sister   ?     breast  ? Heart attack Brother   ? Hyperlipidemia Other   ?     died 78- "natural causes"  ? Hyperlipidemia Other   ?     4 siblings   ? Hypertension Other   ?     3 siblings  ? ?Social History  ? ?Socioeconomic History  ? Marital status: Widowed  ?  Spouse name: Not on file  ? Number of children: Not on file  ? Years of education: Not  on file  ? Highest education level: Not on file  ?Occupational History  ? Occupation: retired  ?Tobacco Use  ? Smoking status: Never  ? Smokeless tobacco: Never  ?Vaping Use  ? Vaping Use: Never used  ?Substance and Sexual Activity  ? Alcohol use: Never  ? Drug use: Never  ? Sexual activity: Not Currently  ?Other Topics Concern  ? Not on file  ?Social History Narrative  ? 05/09/2021  ? Presently living at [facility] in [place].  ? One Son- lives locally, 1 grand-daughterDaughter- [place] Acupuncturist schoolWorks as a Probation officer.  ? ?Social Determinants of Health  ? ?Financial Resource Strain: Low Risk   ? Difficulty of Paying Living Expenses: Not hard at all  ?Food Insecurity: No Food Insecurity  ? Worried About Charity fundraiser in the Last Year: Never true  ? Ran Out of Food in the Last Year: Never true  ?Transportation Needs: No Transportation Needs  ? Lack of Transportation (Medical): No  ? Lack of Transportation (Non-Medical): No  ?Physical Activity: Insufficiently Active  ? Days of Exercise per Week: 3 days  ? Minutes of Exercise per Session: 20 min  ?Stress: No Stress Concern Present  ? Feeling of Stress :  Not at all  ?Social Connections: Socially Isolated  ? Frequency of Communication with Friends and Family: More than three times a week  ? Frequency of Social Gatherings with Friends and Family: Once a week  ? Attends [employer]

## 2021-07-13 NOTE — Patient Instructions (Signed)
Ariel [name] , ?Thank you for taking time to come for your Medicare Wellness Visit. I appreciate your ongoing commitment to your health goals. Please review the following plan we discussed and let me know if I can assist you in the future.  ? ?Screening recommendations/referrals: ?Colonoscopy: pt will follow up ?Mammogram: pt will follow up  ?Bone Density: Done 06/25/17 repeat every 2 years  ?Recommended yearly ophthalmology/optometry visit for glaucoma screening and checkup ?Recommended yearly dental visit for hygiene and checkup ? ?Vaccinations: ?Influenza vaccine: due  ?Pneumococcal vaccine: due and discussed  ?Tdap vaccine: Done 11/19/13 repeat every 10 years  ?Shingles vaccine: Completed 2/12, 07/10/18   ?Covid-19:Completed 05/30/19 ? ?Advanced directives: Please bring a copy of your health care power of attorney and living will to the office at your convenience. ? ?Conditions/risks identified: Healed and recover from Diagnosis  ? ?Next appointment: Follow up in one year for your annual wellness visit  ? ? ?Preventive Care 47 Years and Older, Female ?Preventive care refers to lifestyle choices and visits with your health care provider that can promote health and wellness. ?What does preventive care include? ?A yearly physical exam. This is also called an annual well check. ?Dental exams once or twice a year. ?Routine eye exams. Ask your health care provider how often you should have your eyes checked. ?Personal lifestyle choices, including: ?Daily care of your teeth and gums. ?Regular physical activity. ?Eating a healthy diet. ?Avoiding tobacco and drug use. ?Limiting alcohol use. ?Practicing safe sex. ?Taking low-dose aspirin every day. ?Taking vitamin and mineral supplements as recommended by your health care provider. ?What happens during an annual well check? ?The services and screenings done by your health care provider during your annual well check will depend on your age, overall health, lifestyle risk factors,  and family history of disease. ?Counseling  ?Your health care provider may ask you questions about your: ?Alcohol use. ?Tobacco use. ?Drug use. ?Emotional well-being. ?Home and relationship well-being. ?Sexual activity. ?Eating habits. ?History of falls. ?Memory and ability to understand (cognition). ?Work and work Statistician. ?Reproductive health. ?Screening  ?You may have the following tests or measurements: ?Height, weight, and BMI. ?Blood pressure. ?Lipid and cholesterol levels. These may be checked every 5 years, or more frequently if you are over 110 years old. ?Skin check. ?Lung cancer screening. You may have this screening every year starting at age 9 if you have a 30-pack-year history of smoking and currently smoke or have quit within the past 15 years. ?Fecal occult blood test (FOBT) of the stool. You may have this test every year starting at age 50. ?Flexible sigmoidoscopy or colonoscopy. You may have a sigmoidoscopy every 5 years or a colonoscopy every 10 years starting at age 64. ?Hepatitis C blood test. ?Hepatitis B blood test. ?Sexually transmitted disease (STD) testing. ?Diabetes screening. This is done by checking your blood sugar (glucose) after you have not eaten for a while (fasting). You may have this done every 1-3 years. ?Bone density scan. This is done to screen for osteoporosis. You may have this done starting at age 29. ?Mammogram. This may be done every 1-2 years. Talk to your health care provider about how often you should have regular mammograms. ?Talk with your health care provider about your test results, treatment options, and if necessary, the need for more tests. ?Vaccines  ?Your health care provider may recommend certain vaccines, such as: ?Influenza vaccine. This is recommended every year. ?Tetanus, diphtheria, and acellular pertussis (Tdap, Td) vaccine. You may need  a Td booster every 10 years. ?Zoster vaccine. You may need this after age 1. ?Pneumococcal 13-valent conjugate  (PCV13) vaccine. One dose is recommended after age 63. ?Pneumococcal polysaccharide (PPSV23) vaccine. One dose is recommended after age 16. ?Talk to your health care provider about which screenings and vaccines you need and how often you need them. ?This information is not intended to replace advice given to you by your health care provider. Make sure you discuss any questions you have with your health care provider. ?Document Released: 03/12/2015 Document Revised: 11/03/2015 Document Reviewed: 12/15/2014 ?Elsevier Interactive Patient Education ? 2017 [employer]. ? ?Fall Prevention in the Home ?Falls can cause injuries. They can happen to people of all ages. There are many things you can do to make your home safe and to help prevent falls. ?What can I do on the outside of my home? ?Regularly fix the edges of walkways and driveways and fix any cracks. ?Remove anything that might make you trip as you walk through a door, such as a raised step or threshold. ?Trim any bushes or trees on the path to your home. ?Use bright outdoor lighting. ?Clear any walking paths of anything that might make someone trip, such as rocks or tools. ?Regularly check to see if handrails are loose or broken. Make sure that both sides of any steps have handrails. ?Any raised decks and porches should have guardrails on the edges. ?Have any leaves, snow, or ice cleared regularly. ?Use sand or salt on walking paths during winter. ?Clean up any spills in your garage right away. This includes oil or grease spills. ?What can I do in the bathroom? ?Use night lights. ?Install grab bars by the toilet and in the tub and shower. Do not use towel bars as grab bars. ?Use non-skid mats or decals in the tub or shower. ?If you need to sit down in the shower, use a plastic, non-slip stool. ?Keep the floor dry. Clean up any water that spills on the floor as soon as it happens. ?Remove soap buildup in the tub or shower regularly. ?Attach bath mats securely with  double-sided non-slip rug tape. ?Do not have throw rugs and other things on the floor that can make you trip. ?What can I do in the bedroom? ?Use night lights. ?Make sure that you have a light by your bed that is easy to reach. ?Do not use any sheets or blankets that are too big for your bed. They should not hang down onto the floor. ?Have a firm chair that has side arms. You can use this for support while you get dressed. ?Do not have throw rugs and other things on the floor that can make you trip. ?What can I do in the kitchen? ?Clean up any spills right away. ?Avoid walking on wet floors. ?Keep items that you use a lot in easy-to-reach places. ?If you need to reach something above you, use a strong step stool that has a grab bar. ?Keep electrical cords out of the way. ?Do not use floor polish or wax that makes floors slippery. If you must use wax, use non-skid floor wax. ?Do not have throw rugs and other things on the floor that can make you trip. ?What can I do with my stairs? ?Do not leave any items on the stairs. ?Make sure that there are handrails on both sides of the stairs and use them. Fix handrails that are broken or loose. Make sure that handrails are as long as the stairways. ?Check  any carpeting to make sure that it is firmly attached to the stairs. Fix any carpet that is loose or worn. ?Avoid having throw rugs at the top or bottom of the stairs. If you do have throw rugs, attach them to the floor with carpet tape. ?Make sure that you have a light switch at the top of the stairs and the bottom of the stairs. If you do not have them, ask someone to add them for you. ?What else can I do to help prevent falls? ?Wear shoes that: ?Do not have high heels. ?Have rubber bottoms. ?Are comfortable and fit you well. ?Are closed at the toe. Do not wear sandals. ?If you use a stepladder: ?Make sure that it is fully opened. Do not climb a closed stepladder. ?Make sure that both sides of the stepladder are locked  into place. ?Ask someone to hold it for you, if possible. ?Clearly mark and make sure that you can see: ?Any grab bars or handrails. ?First and last steps. ?Where the edge of each step is. ?Use tools tha

## 2021-07-14 ENCOUNTER — Telehealth: Payer: Self-pay | Admitting: *Deleted

## 2021-07-14 ENCOUNTER — Inpatient Hospital Stay: Payer: Medicare Other

## 2021-07-14 ENCOUNTER — Inpatient Hospital Stay: Payer: Medicare Other | Admitting: *Deleted

## 2021-07-14 DIAGNOSIS — C8330 Diffuse large B-cell lymphoma, unspecified site: Secondary | ICD-10-CM | POA: Diagnosis not present

## 2021-07-14 DIAGNOSIS — Z9221 Personal history of antineoplastic chemotherapy: Secondary | ICD-10-CM | POA: Diagnosis not present

## 2021-07-14 DIAGNOSIS — D509 Iron deficiency anemia, unspecified: Secondary | ICD-10-CM | POA: Diagnosis not present

## 2021-07-14 DIAGNOSIS — C8510 Unspecified B-cell lymphoma, unspecified site: Secondary | ICD-10-CM

## 2021-07-14 DIAGNOSIS — Z79899 Other long term (current) drug therapy: Secondary | ICD-10-CM | POA: Diagnosis not present

## 2021-07-14 LAB — CMP (CANCER CENTER ONLY)
ALT: 52 U/L — ABNORMAL HIGH (ref 0–44)
AST: 21 U/L (ref 15–41)
Albumin: 4.2 g/dL (ref 3.5–5.0)
Alkaline Phosphatase: 78 U/L (ref 38–126)
Anion gap: 6 (ref 5–15)
BUN: 19 mg/dL (ref 8–23)
CO2: 31 mmol/L (ref 22–32)
Calcium: 9.9 mg/dL (ref 8.9–10.3)
Chloride: 97 mmol/L — ABNORMAL LOW (ref 98–111)
Creatinine: 0.74 mg/dL (ref 0.44–1.00)
GFR, Estimated: >60 mL/min (ref >60)
Glucose, Bld: 100 mg/dL — ABNORMAL HIGH (ref 70–99)
Potassium: 4.1 mmol/L (ref 3.5–5.1)
Sodium: 134 mmol/L — ABNORMAL LOW (ref 135–145)
Total Bilirubin: 0.7 mg/dL (ref 0.3–1.2)
Total Protein: 6.6 g/dL (ref 6.5–8.1)

## 2021-07-14 LAB — CBC WITH DIFFERENTIAL (CANCER CENTER ONLY)
Abs Immature Granulocytes: 0 10*3/uL (ref 0.00–0.07)
Basophils Absolute: 0 10*3/uL (ref 0.0–0.1)
Basophils Relative: 0 %
Eosinophils Absolute: 0 10*3/uL (ref 0.0–0.5)
Eosinophils Relative: 8 %
HCT: 28.2 % — ABNORMAL LOW (ref 36.0–46.0)
Hemoglobin: 9.7 g/dL — ABNORMAL LOW (ref 12.0–15.0)
Immature Granulocytes: 0 %
Lymphocytes Relative: 67 %
Lymphs Abs: 0.1 10*3/uL — ABNORMAL LOW (ref 0.7–4.0)
MCH: 30.7 pg (ref 26.0–34.0)
MCHC: 34.4 g/dL (ref 30.0–36.0)
MCV: 89.2 fL (ref 80.0–100.0)
Monocytes Absolute: 0 10*3/uL — ABNORMAL LOW (ref 0.1–1.0)
Monocytes Relative: 0 %
Neutro Abs: 0 10*3/uL — CL (ref 1.7–7.7)
Neutrophils Relative %: 25 %
Platelet Count: 11 10*3/uL — ABNORMAL LOW (ref 150–400)
RBC: 3.16 MIL/uL — ABNORMAL LOW (ref 3.87–5.11)
RDW: 15.7 % — ABNORMAL HIGH (ref 11.5–15.5)
WBC Count: 0.1 10*3/uL — CL (ref 4.0–10.5)
nRBC: 0 % (ref 0.0–0.2)

## 2021-07-14 MED ORDER — SODIUM CHLORIDE 0.9% FLUSH: 10.0000 mL | INTRAVENOUS | Status: DC | PRN

## 2021-07-14 MED ORDER — HEPARIN SOD (PORK) LOCK FLUSH 100 UNIT/ML IV SOLN: 500.0000 [IU] | Freq: Every day | INTRAVENOUS | Status: DC | PRN

## 2021-07-14 MED ORDER — SODIUM CHLORIDE 0.9% IV SOLUTION: 250.0000 mL | Freq: Once | INTRAVENOUS | Status: DC

## 2021-07-14 NOTE — Telephone Encounter (Signed)
Dr. Marin Olp notified of WBC-0.1, ANC-0.0 and platelet count-11.  Order received for pt to get one unit of platelets per order of Dr. Marin Olp. Message sent to scheduling.

## 2021-07-14 NOTE — Patient Instructions (Signed)
Blood Transfusion, Adult, Care After This sheet gives you information about how to care for yourself after your procedure. Your doctor may also give you more specific instructions. If you have problems or questions, contact your doctor. What can I expect after the procedure? After the procedure, it is common to have: Bruising and soreness at the IV site. A headache. Follow these instructions at home: Insertion site care     Follow instructions from your doctor about how to take care of your insertion site. This is where an IV tube was put into your vein. Make sure you: Wash your hands with soap and water before and after you change your bandage (dressing). If you cannot use soap and water, use hand sanitizer. Change your bandage as told by your doctor. Check your insertion site every day for signs of infection. Check for: Redness, swelling, or pain. Bleeding from the site. Warmth. Pus or a bad smell. General instructions Take over-the-counter and prescription medicines only as told by your doctor. Rest as told by your doctor. Go back to your normal activities as told by your doctor. Keep all follow-up visits as told by your doctor. This is important. Contact a doctor if: You have itching or red, swollen areas of skin (hives). You feel worried or nervous (anxious). You feel weak after doing your normal activities. You have redness, swelling, warmth, or pain around the insertion site. You have blood coming from the insertion site, and the blood does not stop with pressure. You have pus or a bad smell coming from the insertion site. Get help right away if: You have signs of a serious reaction. This may be coming from an allergy or the body's defense system (immune system). Signs include: Trouble breathing or shortness of breath. Swelling of the face or feeling warm (flushed). Fever or chills. Head, chest, or back pain. Dark pee (urine) or blood in the pee. Widespread rash. Fast  heartbeat. Feeling dizzy or light-headed. You may receive your blood transfusion in an outpatient setting. If so, you will be told whom to contact to report any reactions. These symptoms may be an emergency. Do not wait to see if the symptoms will go away. Get medical help right away. Call your local emergency services (911 in the [place] Do not drive yourself to the hospital. Summary Bruising and soreness at the IV site are common. Check your insertion site every day for signs of infection. Rest as told by your doctor. Go back to your normal activities as told by your doctor. Get help right away if you have signs of a serious reaction. This information is not intended to replace advice given to you by your health care provider. Make sure you discuss any questions you have with your health care provider. Document Revised: 06/10/2020 Document Reviewed: 08/08/2018 Elsevier Patient Education  2023 Elsevier Inc.  

## 2021-07-15 LAB — BPAM PLATELET PHERESIS
Blood Product Expiration Date: 202305202359
ISSUE DATE / TIME: 202305181420
Unit Type and Rh: 6200

## 2021-07-15 LAB — PREPARE PLATELET PHERESIS: Unit division: 0

## 2021-07-18 ENCOUNTER — Telehealth: Payer: Self-pay

## 2021-07-18 ENCOUNTER — Inpatient Hospital Stay: Payer: Medicare Other

## 2021-07-18 DIAGNOSIS — D509 Iron deficiency anemia, unspecified: Secondary | ICD-10-CM | POA: Diagnosis not present

## 2021-07-18 DIAGNOSIS — Z9221 Personal history of antineoplastic chemotherapy: Secondary | ICD-10-CM | POA: Diagnosis not present

## 2021-07-18 DIAGNOSIS — Z79899 Other long term (current) drug therapy: Secondary | ICD-10-CM | POA: Diagnosis not present

## 2021-07-18 DIAGNOSIS — C8330 Diffuse large B-cell lymphoma, unspecified site: Secondary | ICD-10-CM | POA: Diagnosis not present

## 2021-07-18 DIAGNOSIS — C8510 Unspecified B-cell lymphoma, unspecified site: Secondary | ICD-10-CM

## 2021-07-18 LAB — CBC WITH DIFFERENTIAL (CANCER CENTER ONLY)
Abs Immature Granulocytes: 0 10*3/uL (ref 0.00–0.07)
Basophils Absolute: 0 10*3/uL (ref 0.0–0.1)
Basophils Relative: 0 %
Eosinophils Absolute: 0 10*3/uL (ref 0.0–0.5)
Eosinophils Relative: 4 %
HCT: 23.9 % — ABNORMAL LOW (ref 36.0–46.0)
Hemoglobin: 8.4 g/dL — ABNORMAL LOW (ref 12.0–15.0)
Immature Granulocytes: 0 %
Lymphocytes Relative: 56 %
Lymphs Abs: 0.1 10*3/uL — ABNORMAL LOW (ref 0.7–4.0)
MCH: 30.9 pg (ref 26.0–34.0)
MCHC: 35.1 g/dL (ref 30.0–36.0)
MCV: 87.9 fL (ref 80.0–100.0)
Monocytes Absolute: 0.1 10*3/uL (ref 0.1–1.0)
Monocytes Relative: 24 %
Neutro Abs: 0 10*3/uL — CL (ref 1.7–7.7)
Neutrophils Relative %: 16 %
Platelet Count: 13 10*3/uL — ABNORMAL LOW (ref 150–400)
RBC: 2.72 MIL/uL — ABNORMAL LOW (ref 3.87–5.11)
RDW: 15 % (ref 11.5–15.5)
Smear Review: NORMAL
WBC Count: 0.3 10*3/uL — CL (ref 4.0–10.5)
nRBC: 0 % (ref 0.0–0.2)

## 2021-07-18 LAB — CMP (CANCER CENTER ONLY)
ALT: 34 U/L (ref 0–44)
AST: 16 U/L (ref 15–41)
Albumin: 4.2 g/dL (ref 3.5–5.0)
Alkaline Phosphatase: 97 U/L (ref 38–126)
Anion gap: 6 (ref 5–15)
BUN: 17 mg/dL (ref 8–23)
CO2: 30 mmol/L (ref 22–32)
Calcium: 10.2 mg/dL (ref 8.9–10.3)
Chloride: 101 mmol/L (ref 98–111)
Creatinine: 0.69 mg/dL (ref 0.44–1.00)
GFR, Estimated: >60 mL/min (ref >60)
Glucose, Bld: 118 mg/dL — ABNORMAL HIGH (ref 70–99)
Potassium: 4.1 mmol/L (ref 3.5–5.1)
Sodium: 137 mmol/L (ref 135–145)
Total Bilirubin: 0.5 mg/dL (ref 0.3–1.2)
Total Protein: 6.8 g/dL (ref 6.5–8.1)

## 2021-07-18 LAB — SAMPLE TO BLOOD BANK

## 2021-07-18 LAB — LACTATE DEHYDROGENASE: LDH: 153 U/L (ref 98–192)

## 2021-07-18 NOTE — Telephone Encounter (Signed)
Critical from lab: WBC 0.3, ANC 0.0, PLT 13, HGB 8.4 MD aware. No orders for today, recheck labs Thursday. Pt aware

## 2021-07-21 ENCOUNTER — Inpatient Hospital Stay: Payer: Medicare Other

## 2021-07-21 ENCOUNTER — Encounter: Payer: Self-pay | Admitting: *Deleted

## 2021-07-21 ENCOUNTER — Telehealth: Payer: Self-pay | Admitting: *Deleted

## 2021-07-21 ENCOUNTER — Encounter: Payer: Self-pay | Admitting: Hematology & Oncology

## 2021-07-21 ENCOUNTER — Other Ambulatory Visit: Payer: Self-pay | Admitting: *Deleted

## 2021-07-21 ENCOUNTER — Other Ambulatory Visit: Payer: Self-pay

## 2021-07-21 ENCOUNTER — Inpatient Hospital Stay (HOSPITAL_BASED_OUTPATIENT_CLINIC_OR_DEPARTMENT_OTHER): Payer: Medicare Other | Admitting: Hematology & Oncology

## 2021-07-21 VITALS — BP 107/46 | HR 82 | Temp 98.2°F | Resp 18 | Wt 153.0 lb

## 2021-07-21 DIAGNOSIS — D509 Iron deficiency anemia, unspecified: Secondary | ICD-10-CM | POA: Diagnosis not present

## 2021-07-21 DIAGNOSIS — Z9221 Personal history of antineoplastic chemotherapy: Secondary | ICD-10-CM | POA: Diagnosis not present

## 2021-07-21 DIAGNOSIS — C8510 Unspecified B-cell lymphoma, unspecified site: Secondary | ICD-10-CM

## 2021-07-21 DIAGNOSIS — C8330 Diffuse large B-cell lymphoma, unspecified site: Secondary | ICD-10-CM | POA: Diagnosis not present

## 2021-07-21 DIAGNOSIS — Z79899 Other long term (current) drug therapy: Secondary | ICD-10-CM | POA: Diagnosis not present

## 2021-07-21 LAB — CBC WITH DIFFERENTIAL (CANCER CENTER ONLY)
Abs Immature Granulocytes: 0.35 10*3/uL — ABNORMAL HIGH (ref 0.00–0.07)
Basophils Absolute: 0.1 10*3/uL (ref 0.0–0.1)
Basophils Relative: 1 %
Eosinophils Absolute: 0 10*3/uL (ref 0.0–0.5)
Eosinophils Relative: 0 %
HCT: 22.2 % — ABNORMAL LOW (ref 36.0–46.0)
Hemoglobin: 7.6 g/dL — ABNORMAL LOW (ref 12.0–15.0)
Immature Granulocytes: 8 %
Lymphocytes Relative: 5 %
Lymphs Abs: 0.2 10*3/uL — ABNORMAL LOW (ref 0.7–4.0)
MCH: 30.2 pg (ref 26.0–34.0)
MCHC: 34.2 g/dL (ref 30.0–36.0)
MCV: 88.1 fL (ref 80.0–100.0)
Monocytes Absolute: 1 10*3/uL (ref 0.1–1.0)
Monocytes Relative: 22 %
Neutro Abs: 3 10*3/uL (ref 1.7–7.7)
Neutrophils Relative %: 64 %
Platelet Count: 7 10*3/uL — CL (ref 150–400)
RBC: 2.52 MIL/uL — ABNORMAL LOW (ref 3.87–5.11)
RDW: 15 % (ref 11.5–15.5)
Smear Review: NORMAL
WBC Count: 4.7 10*3/uL (ref 4.0–10.5)
nRBC: 0 % (ref 0.0–0.2)

## 2021-07-21 LAB — CMP (CANCER CENTER ONLY)
ALT: 23 U/L (ref 0–44)
AST: 17 U/L (ref 15–41)
Albumin: 4 g/dL (ref 3.5–5.0)
Alkaline Phosphatase: 106 U/L (ref 38–126)
Anion gap: 8 (ref 5–15)
BUN: 15 mg/dL (ref 8–23)
CO2: 27 mmol/L (ref 22–32)
Calcium: 9.7 mg/dL (ref 8.9–10.3)
Chloride: 102 mmol/L (ref 98–111)
Creatinine: 0.75 mg/dL (ref 0.44–1.00)
GFR, Estimated: >60 mL/min (ref >60)
Glucose, Bld: 141 mg/dL — ABNORMAL HIGH (ref 70–99)
Potassium: 3.8 mmol/L (ref 3.5–5.1)
Sodium: 137 mmol/L (ref 135–145)
Total Bilirubin: 0.4 mg/dL (ref 0.3–1.2)
Total Protein: 6.4 g/dL — ABNORMAL LOW (ref 6.5–8.1)

## 2021-07-21 LAB — LACTATE DEHYDROGENASE: LDH: 209 U/L — ABNORMAL HIGH (ref 98–192)

## 2021-07-21 LAB — PREPARE RBC (CROSSMATCH)

## 2021-07-21 MED ORDER — HEPARIN SOD (PORK) LOCK FLUSH 100 UNIT/ML IV SOLN
250.0000 [IU] | INTRAVENOUS | Status: AC | PRN
  Administered 2021-07-21: 500 [IU]

## 2021-07-21 MED ORDER — SODIUM CHLORIDE 0.9% IV SOLUTION
250.0000 mL | Freq: Once | INTRAVENOUS | Status: AC
  Administered 2021-07-21: 250 mL via INTRAVENOUS

## 2021-07-21 MED ORDER — SODIUM CHLORIDE 0.9% FLUSH
3.0000 mL | INTRAVENOUS | Status: AC | PRN
  Administered 2021-07-21: 10 mL

## 2021-07-21 NOTE — Telephone Encounter (Signed)
Kary Kos, from [facility] lab, brought to my attention a panic platelet count of 7. Results given to MD.

## 2021-07-21 NOTE — Progress Notes (Signed)
Hematology and Oncology Follow Up Visit  Ariel [name] [id number] [birth year] 66 y.o. 07/21/2021   Principle Diagnosis:  Diffuse large cell non-Hodgkin's lymphoma-CNS involvement  Current Therapy:   Status post chemotherapy with R-ICE/R-MTV --  s/p cycle #4 MATRIX -- s/p cycle #3 -- start on 05/02/2021 Neulasta 6 mg subcu post chemotherapy     Interim History:  Ariel [name] is comes in for follow-up.  [place].  Her daughter and family are coming up from [place].  She will be leaving, I think, on Saturday.  She feels well.  She is able to go home for a couple of days which she really enjoyed.  She has had little bit of tiredness.  Her hemoglobin is 7.6 today.  We will give her 1 unit of blood.  She has had no bleeding or bruising.  Her platelet count is 7000.  I will give her 2 units of platelets..  She has had no fever.  There is no cough.  Her left hip is still bothering her.  I think she gets an injection into the hip I think maybe tomorrow.  At some point, she will need to have surgery for the left hip because of avascular necrosis.  Her liver tests are all normal now.  She was had elevated LFTs with methotrexate.  There is no change in bowel or bladder habits.  She is having no diarrhea.  There is no mouth sores.  Overall, I would say performance status is probably ECOG 1.   Medications:  Current Outpatient Medications:    acetaminophen (TYLENOL) 500 MG tablet, Take 500-1,000 mg by mouth every 6 (six) hours as needed for mild pain or headache., Disp: , Rfl:    acyclovir (ZOVIRAX) 200 MG capsule, TAKE 1 CAPSULE TWICE A DAY., Disp: 60 capsule, Rfl: 2   antiseptic oral rinse (BIOTENE) LIQD, 15 mLs by Mouth Rinse route every 4 (four) hours. (Patient taking differently: 15 mLs by Mouth Rinse route every 4 (four) hours as needed for dry mouth or mouth pain.), Disp: 500 mL, Rfl: 4   docusate sodium (COLACE) 100 MG capsule, Take 1 capsule (100 mg total) by  mouth 2 (two) times daily as needed for mild constipation., Disp: 10 capsule, Rfl: 0   fluconazole (DIFLUCAN) 100 MG tablet, Take 1 tablet (100 mg total) by mouth daily., Disp: 15 tablet, Rfl: 4   ondansetron (ZOFRAN) 8 MG tablet, Take 1 tablet (8 mg total) by mouth every 8 (eight) hours as needed for nausea or vomiting., Disp: 30 tablet, Rfl: 3   OVER THE COUNTER MEDICATION, Take 3 capsules by mouth See admin instructions. Balance of Nature VEGETABLE formulation capsules- Take 3 capsules by mouth every morning, Disp: , Rfl:    OVER THE COUNTER MEDICATION, Take 3 capsules by mouth See admin instructions. Balance of Nature FRUIT formulation capsules- Take 3 capsules by mouth every morning, Disp: , Rfl:    traMADol (ULTRAM) 50 MG tablet, Take one (50 mg) to two (100 mg) tablets every six hours as needed for pain (Patient taking differently: Take 50-100 mg by mouth every 6 (six) hours as needed for moderate pain.), Disp: 60 tablet, Rfl: 0   VUITY 1.25 % SOLN, Place 1 drop into both eyes in the morning and at bedtime., Disp: , Rfl:  No current facility-administered medications for this visit.  Facility-Administered Medications Ordered in Other Visits:    0.9 %  sodium chloride infusion (Manually program via Guardrails IV Fluids), 250  mL, Intravenous, Once, Juliani Laduke, Rudell Cobb, MD   heparin lock flush 100 unit/mL, 250 Units, Intracatheter, PRN, Breydon Senters, Rudell Cobb, MD   sodium chloride flush (NS) 0.9 % injection 3 mL, 3 mL, Intracatheter, PRN, Marin Olp, Rudell Cobb, MD  Allergies:  Allergies  Allergen Reactions   Decadron [Dexamethasone] Other (See Comments)    Made the patient's legs tired and she could not walk   Plavix [Clopidogrel] Hives, Swelling and Other (See Comments)    Lips became swollen    Prednisone Other (See Comments)    Steroids caused psychological issues    Codeine Nausea Only    Past Medical History, Surgical history, Social history, and Family History were reviewed and  updated.  Review of Systems: Review of Systems  Constitutional: Negative.   HENT:  Negative.    Eyes: Negative.   Respiratory: Negative.    Cardiovascular: Negative.   Gastrointestinal: Negative.   Endocrine: Negative.   Genitourinary: Negative.    Musculoskeletal: Negative.   Skin: Negative.   Neurological: Negative.   Hematological: Negative.   Psychiatric/Behavioral: Negative.     Physical Exam:  weight is 153 lb (69.4 kg). Her oral temperature is 98.2 F (36.8 C). Her blood pressure is 107/46 (abnormal) and her pulse is 82. Her respiration is 18 and oxygen saturation is 98%.   Wt Readings from Last 3 Encounters:  07/21/21 153 lb (69.4 kg)  07/11/21 152 lb 1.9 oz (69 kg)  06/30/21 154 lb (69.9 kg)    Physical Exam Vitals reviewed.  HENT:     Head: Normocephalic and atraumatic.     Mouth/Throat:     Comments: Oral exam shows multiple sites of gingival bleeding.  She has gauze in her mouth to try to help with the bleeding.  There is no obvious gingival breakdown. Eyes:     Pupils: Pupils are equal, round, and reactive to light.  Cardiovascular:     Rate and Rhythm: Normal rate and regular rhythm.     Heart sounds: Normal heart sounds.     Comments: Cardiac exam is regular rate and rhythm with no murmurs, rubs or bruits. Pulmonary:     Effort: Pulmonary effort is normal.     Breath sounds: Normal breath sounds.  Abdominal:     General: Bowel sounds are normal.     Palpations: Abdomen is soft.  Musculoskeletal:        General: No tenderness or deformity. Normal range of motion.     Cervical back: Normal range of motion.     Comments: Examination of her left hip shows little bit of tenderness to palpation over the anterior portion of the left hip.  She seems to have decent range of motion.  She has no swelling.  Lymphadenopathy:     Cervical: No cervical adenopathy.  Skin:    General: Skin is warm and dry.     Findings: No erythema or rash.     Comments: Skin exam  does show petechia on her lower legs.  Neurological:     Mental Status: She is alert and oriented to person, place, and time.     Comments: Neurological exam shows no focal neurological deficits.  All cranial nerves are intact.  She has good strength in upper and lower extremities.  Psychiatric:        Behavior: Behavior normal.        Thought Content: Thought content normal.        Judgment: Judgment normal.     Lab Results  Component Value Date   WBC 4.7 07/21/2021   HGB 7.6 (L) 07/21/2021   HCT 22.2 (L) 07/21/2021   MCV 88.1 07/21/2021   PLT 7 (LL) 07/21/2021     Chemistry      Component Value Date/Time   NA 137 07/21/2021 0810   K 3.8 07/21/2021 0810   CL 102 07/21/2021 0810   CO2 27 07/21/2021 0810   BUN 15 07/21/2021 0810   CREATININE 0.75 07/21/2021 0810   CREATININE 0.68 11/18/2012 1439      Component Value Date/Time   CALCIUM 9.7 07/21/2021 0810   ALKPHOS 106 07/21/2021 0810   AST 17 07/21/2021 0810   ALT 23 07/21/2021 0810   BILITOT 0.4 07/21/2021 0810      Impression and Plan: Ms. Lal is a very charming 66 year old white female.  She initially came to [place] with iron deficiency anemia.  We subsequently found that she had diffuse large cell non-Hodgkin's lymphoma.  It had traveled to her brain.  She has had 4 cycles of treatment.  We basically gave her R-ICE alternating with R-MTV.  She tolerated this very well.  She has had 3 cycles of the MATRIX protocol.  We will have her admitted on June 5 for her fourth and final cycle of MATRIX.  After that, I think we can just watch her.  She is done incredibly well.  Again, there is always a risk of recurrence.  However, I think that she has tolerated treatment.  We have had good CNS penetration of medication.  At some point, she will need to have the left hip repaired.  I would like to think that once she recovers her blood counts from the last cycle of chemotherapy, she can probably have surgery.  I will likely  plan to have her come back to the office on June 12 to start her lab work.  Right now, we will go ahead and check her blood counts twice a week.  We will have her come back Thursday and then Monday and Thursday next week.   Volanda Napoleon, MD 5/25/20238:57 AM

## 2021-07-21 NOTE — Progress Notes (Signed)
Patient will receive next inpatient treatment of MATRIX on June 5th. Admitting department, insurance specialist, and inpatient chemo team all notified of planned patient admit.   Oncology Nurse Navigator Documentation     07/21/2021    9:15 AM  Oncology Nurse Navigator Flowsheets  Navigator Follow Up Date: 08/01/2021  Navigator Follow Up Reason: Other:  Navigator Location [facility]  Navigator Encounter Type Treatment;Appt/Treatment Plan Review  Patient Visit Type MedOnc  Treatment Phase Active Tx  Barriers/Navigation Needs Coordination of Care;Education  Interventions Psycho-Social Support  Acuity Level 2-Minimal Needs (1-2 Barriers Identified)  Coordination of Care Other  Support Groups/Services Friends and Family  Time Spent with Patient 30

## 2021-07-21 NOTE — Patient Instructions (Signed)

## 2021-07-21 NOTE — Patient Instructions (Signed)
Blood Transfusion, Adult A blood transfusion is a procedure in which you receive blood or a type of blood cell (blood component) through an IV. You may need a blood transfusion when your blood level is low. This may result from a bleeding disorder, illness, injury, or surgery. The blood may come from a donor. You may also be able to donate blood for yourself (autologous blood donation) before a planned surgery. The blood given in a transfusion is made up of different blood components. You may receive: Red blood cells. These carry oxygen to the cells in the body. Platelets. These help your blood to clot. Plasma. This is the liquid part of your blood. It carries proteins and other substances throughout the body. White blood cells. These help you fight infections. If you have hemophilia or another clotting disorder, you may also receive other types of blood products. Tell a health care provider about: Any blood disorders you have. Any previous reactions you have had during a blood transfusion. Any allergies you have. All medicines you are taking, including vitamins, herbs, eye drops, creams, and over-the-counter medicines. Any surgeries you have had. Any medical conditions you have, including any recent fever or cold symptoms. Whether you are pregnant or may be pregnant. What are the risks? Generally, this is a safe procedure. However, problems may occur. The most common problems include: A mild allergic reaction, such as red, swollen areas of skin (hives) and itching. Fever or chills. This may be the body's response to new blood cells received. This may occur during or up to 4 hours after the transfusion. More serious problems may include: Transfusion-associated circulatory overload (TACO), or too much fluid in the lungs. This may cause breathing problems. A serious allergic reaction, such as difficulty breathing or swelling around the face and lips. Transfusion-related acute lung injury  (TRALI), which causes breathing difficulty and low oxygen in the blood. This can occur within hours of the transfusion or several days later. Iron overload. This can happen after receiving many blood transfusions over a period of time. Infection or virus being transmitted. This is rare because donated blood is carefully tested before it is given. Hemolytic transfusion reaction. This is rare. It happens when your body's defense system (immune system)tries to attack the new blood cells. Symptoms may include fever, chills, nausea, low blood pressure, and low back or chest pain. Transfusion-associated graft-versus-host disease (TAGVHD). This is rare. It happens when donated cells attack your body's healthy tissues. What happens before the procedure? Medicines Ask your health care provider about: Changing or stopping your regular medicines. This is especially important if you are taking diabetes medicines or blood thinners. Taking medicines such as aspirin and ibuprofen. These medicines can thin your blood. Do not take these medicines unless your health care provider tells you to take them. Taking over-the-counter medicines, vitamins, herbs, and supplements. General instructions Follow instructions from your health care provider about eating and drinking restrictions. You will have a blood test to determine your blood type. This is necessary to know what kind of blood your body will accept and to match it to the donor blood. If you are going to have a planned surgery, you may be able to do an autologous blood donation. This may be done in case you need to have a transfusion. You will have your temperature, blood pressure, and pulse monitored before the transfusion. If you have had an allergic reaction to a transfusion in the past, you may be given medicine to help prevent   a reaction. This medicine may be given to you by mouth (orally) or through an IV. Set aside time for the blood transfusion. This  procedure generally takes 1-4 hours to complete. What happens during the procedure?  An IV will be inserted into one of your veins. The bag of donated blood will be attached to your IV. The blood will then enter through your vein. Your temperature, blood pressure, and pulse will be monitored regularly during the transfusion. This monitoring is done to detect early signs of a transfusion reaction. Tell your nurse right away if you have any of these symptoms during the transfusion: Shortness of breath or trouble breathing. Chest or back pain. Fever or chills. Hives or itching. If you have any signs or symptoms of a reaction, your transfusion will be stopped and you may be given medicine. When the transfusion is complete, your IV will be removed. Pressure may be applied to the IV site for a few minutes. A bandage (dressing)will be applied. The procedure may vary among health care providers and hospitals. What happens after the procedure? Your temperature, blood pressure, pulse, breathing rate, and blood oxygen level will be monitored until you leave the hospital or clinic. Your blood may be tested to see how you are responding to the transfusion. You may be warmed with fluids or blankets to maintain a normal body temperature. If you receive your blood transfusion in an outpatient setting, you will be told whom to contact to report any reactions. Where to find more information For more information on blood transfusions, visit the [employer]: redcross.org Summary A blood transfusion is a procedure in which you receive blood or a type of blood cell (blood component) through an IV. The blood you receive may come from a donor or be donated by yourself (autologous blood donation) before a planned surgery. The blood given in a transfusion is made up of different blood components. You may receive red blood cells, platelets, plasma, or white blood cells depending on the condition treated. Your  temperature, blood pressure, and pulse will be monitored before, during, and after the transfusion. After the transfusion, your blood may be tested to see how your body has responded. This information is not intended to replace advice given to you by your health care provider. Make sure you discuss any questions you have with your health care provider. Document Revised: 12/19/2018 Document Reviewed: 08/08/2018 Elsevier Patient Education  2023 [employer] Inc.  

## 2021-07-22 DIAGNOSIS — M25552 Pain in left hip: Secondary | ICD-10-CM | POA: Diagnosis not present

## 2021-07-22 LAB — BPAM RBC
Blood Product Expiration Date: 202306182359
ISSUE DATE / TIME: 202305251115
Unit Type and Rh: 6200

## 2021-07-22 LAB — PREPARE PLATELET PHERESIS
Unit division: 0
Unit division: 0

## 2021-07-22 LAB — BPAM PLATELET PHERESIS
Blood Product Expiration Date: 202305262359
Blood Product Expiration Date: 202305272359
ISSUE DATE / TIME: 202305251059
ISSUE DATE / TIME: 202305251059
Unit Type and Rh: 6200
Unit Type and Rh: 7300

## 2021-07-22 LAB — TYPE AND SCREEN
ABO/RH(D): A POS
Antibody Screen: NEGATIVE
Unit division: 0

## 2021-07-30 ENCOUNTER — Other Ambulatory Visit: Payer: Self-pay | Admitting: Hematology & Oncology

## 2021-07-30 DIAGNOSIS — C8510 Unspecified B-cell lymphoma, unspecified site: Secondary | ICD-10-CM

## 2021-08-01 ENCOUNTER — Encounter: Payer: Self-pay | Admitting: *Deleted

## 2021-08-01 ENCOUNTER — Encounter (HOSPITAL_COMMUNITY): Payer: Self-pay | Admitting: Hematology & Oncology

## 2021-08-01 ENCOUNTER — Inpatient Hospital Stay (HOSPITAL_COMMUNITY)
Admission: AD | Admit: 2021-08-01 | Discharge: 2021-08-06 | DRG: 846 | Disposition: A | Discharge status: Home / Self Care | Payer: Medicare Other | Source: Ambulatory Visit | Attending: Hematology & Oncology | Admitting: Hematology & Oncology

## 2021-08-01 ENCOUNTER — Other Ambulatory Visit: Payer: Self-pay

## 2021-08-01 ENCOUNTER — Inpatient Hospital Stay (HOSPITAL_COMMUNITY): Payer: Medicare Other

## 2021-08-01 DIAGNOSIS — Z5111 Encounter for antineoplastic chemotherapy: Secondary | ICD-10-CM | POA: Diagnosis not present

## 2021-08-01 DIAGNOSIS — Z888 Allergy status to other drugs, medicaments and biological substances status: Secondary | ICD-10-CM

## 2021-08-01 DIAGNOSIS — M87852 Other osteonecrosis, left femur: Secondary | ICD-10-CM | POA: Diagnosis present

## 2021-08-01 DIAGNOSIS — Z79899 Other long term (current) drug therapy: Secondary | ICD-10-CM | POA: Diagnosis not present

## 2021-08-01 DIAGNOSIS — C8331 Diffuse large B-cell lymphoma, lymph nodes of head, face, and neck: Secondary | ICD-10-CM

## 2021-08-01 DIAGNOSIS — Z885 Allergy status to narcotic agent status: Secondary | ICD-10-CM

## 2021-08-01 DIAGNOSIS — T451X5A Adverse effect of antineoplastic and immunosuppressive drugs, initial encounter: Secondary | ICD-10-CM | POA: Diagnosis present

## 2021-08-01 DIAGNOSIS — C8510 Unspecified B-cell lymphoma, unspecified site: Principal | ICD-10-CM

## 2021-08-01 DIAGNOSIS — D6481 Anemia due to antineoplastic chemotherapy: Secondary | ICD-10-CM | POA: Diagnosis present

## 2021-08-01 DIAGNOSIS — C8330 Diffuse large B-cell lymphoma, unspecified site: Secondary | ICD-10-CM | POA: Diagnosis present

## 2021-08-01 DIAGNOSIS — E7800 Pure hypercholesterolemia, unspecified: Secondary | ICD-10-CM | POA: Diagnosis present

## 2021-08-01 DIAGNOSIS — D509 Iron deficiency anemia, unspecified: Secondary | ICD-10-CM | POA: Diagnosis not present

## 2021-08-01 DIAGNOSIS — Z8249 Family history of ischemic heart disease and other diseases of the circulatory system: Secondary | ICD-10-CM | POA: Diagnosis not present

## 2021-08-01 DIAGNOSIS — R1110 Vomiting, unspecified: Secondary | ICD-10-CM | POA: Diagnosis present

## 2021-08-01 DIAGNOSIS — Z9221 Personal history of antineoplastic chemotherapy: Secondary | ICD-10-CM | POA: Diagnosis not present

## 2021-08-01 DIAGNOSIS — Z8673 Personal history of transient ischemic attack (TIA), and cerebral infarction without residual deficits: Secondary | ICD-10-CM | POA: Diagnosis not present

## 2021-08-01 DIAGNOSIS — Z85820 Personal history of malignant melanoma of skin: Secondary | ICD-10-CM

## 2021-08-01 DIAGNOSIS — C8590 Non-Hodgkin lymphoma, unspecified, unspecified site: Secondary | ICD-10-CM | POA: Diagnosis not present

## 2021-08-01 DIAGNOSIS — Z809 Family history of malignant neoplasm, unspecified: Secondary | ICD-10-CM | POA: Diagnosis not present

## 2021-08-01 DIAGNOSIS — C8332 Diffuse large B-cell lymphoma, intrathoracic lymph nodes: Secondary | ICD-10-CM | POA: Diagnosis not present

## 2021-08-01 DIAGNOSIS — D61810 Antineoplastic chemotherapy induced pancytopenia: Secondary | ICD-10-CM | POA: Diagnosis not present

## 2021-08-01 LAB — COMPREHENSIVE METABOLIC PANEL
ALT: 30 U/L (ref 0–44)
AST: 31 U/L (ref 15–41)
Albumin: 4.1 g/dL (ref 3.5–5.0)
Alkaline Phosphatase: 84 U/L (ref 38–126)
Anion gap: 8 (ref 5–15)
BUN: 11 mg/dL (ref 8–23)
CO2: 27 mmol/L (ref 22–32)
Calcium: 9.4 mg/dL (ref 8.9–10.3)
Chloride: 103 mmol/L (ref 98–111)
Creatinine, Ser: 0.67 mg/dL (ref 0.44–1.00)
GFR, Estimated: >60 mL/min (ref >60)
Glucose, Bld: 112 mg/dL — ABNORMAL HIGH (ref 70–99)
Potassium: 4.2 mmol/L (ref 3.5–5.1)
Sodium: 138 mmol/L (ref 135–145)
Total Bilirubin: 0.5 mg/dL (ref 0.3–1.2)
Total Protein: 6.9 g/dL (ref 6.5–8.1)

## 2021-08-01 LAB — CBC WITH DIFFERENTIAL/PLATELET
Abs Immature Granulocytes: 0.03 10*3/uL (ref 0.00–0.07)
Basophils Absolute: 0 10*3/uL (ref 0.0–0.1)
Basophils Relative: 1 %
Eosinophils Absolute: 0 10*3/uL (ref 0.0–0.5)
Eosinophils Relative: 0 %
HCT: 27 % — ABNORMAL LOW (ref 36.0–46.0)
Hemoglobin: 8.9 g/dL — ABNORMAL LOW (ref 12.0–15.0)
Immature Granulocytes: 1 %
Lymphocytes Relative: 27 %
Lymphs Abs: 0.9 10*3/uL (ref 0.7–4.0)
MCH: 31.2 pg (ref 26.0–34.0)
MCHC: 33 g/dL (ref 30.0–36.0)
MCV: 94.7 fL (ref 80.0–100.0)
Monocytes Absolute: 0.7 10*3/uL (ref 0.1–1.0)
Monocytes Relative: 20 %
Neutro Abs: 1.7 10*3/uL (ref 1.7–7.7)
Neutrophils Relative %: 51 %
Platelets: 115 10*3/uL — ABNORMAL LOW (ref 150–400)
RBC: 2.85 MIL/uL — ABNORMAL LOW (ref 3.87–5.11)
RDW: 19.3 % — ABNORMAL HIGH (ref 11.5–15.5)
WBC: 3.3 10*3/uL — ABNORMAL LOW (ref 4.0–10.5)
nRBC: 0 % (ref 0.0–0.2)

## 2021-08-01 LAB — URINALYSIS, DIPSTICK ONLY
Bilirubin Urine: NEGATIVE
Glucose, UA: NEGATIVE mg/dL
Hgb urine dipstick: NEGATIVE
Ketones, ur: NEGATIVE mg/dL
Leukocytes,Ua: NEGATIVE
Nitrite: NEGATIVE
Protein, ur: NEGATIVE mg/dL
Specific Gravity, Urine: 1.025 (ref 1.005–1.030)
pH: 5 (ref 5.0–8.0)

## 2021-08-01 LAB — PHOSPHORUS: Phosphorus: 4 mg/dL (ref 2.5–4.6)

## 2021-08-01 LAB — MAGNESIUM: Magnesium: 2.4 mg/dL (ref 1.7–2.4)

## 2021-08-01 IMAGING — DX DG CHEST 2V
2 series · 2 of 2 positions shown · non-contrast
Comparison: Chest x-ray [DATE]

CLINICAL DATA: Lymphoma.

EXAM:
CHEST - 2 VIEW

[chest pa]
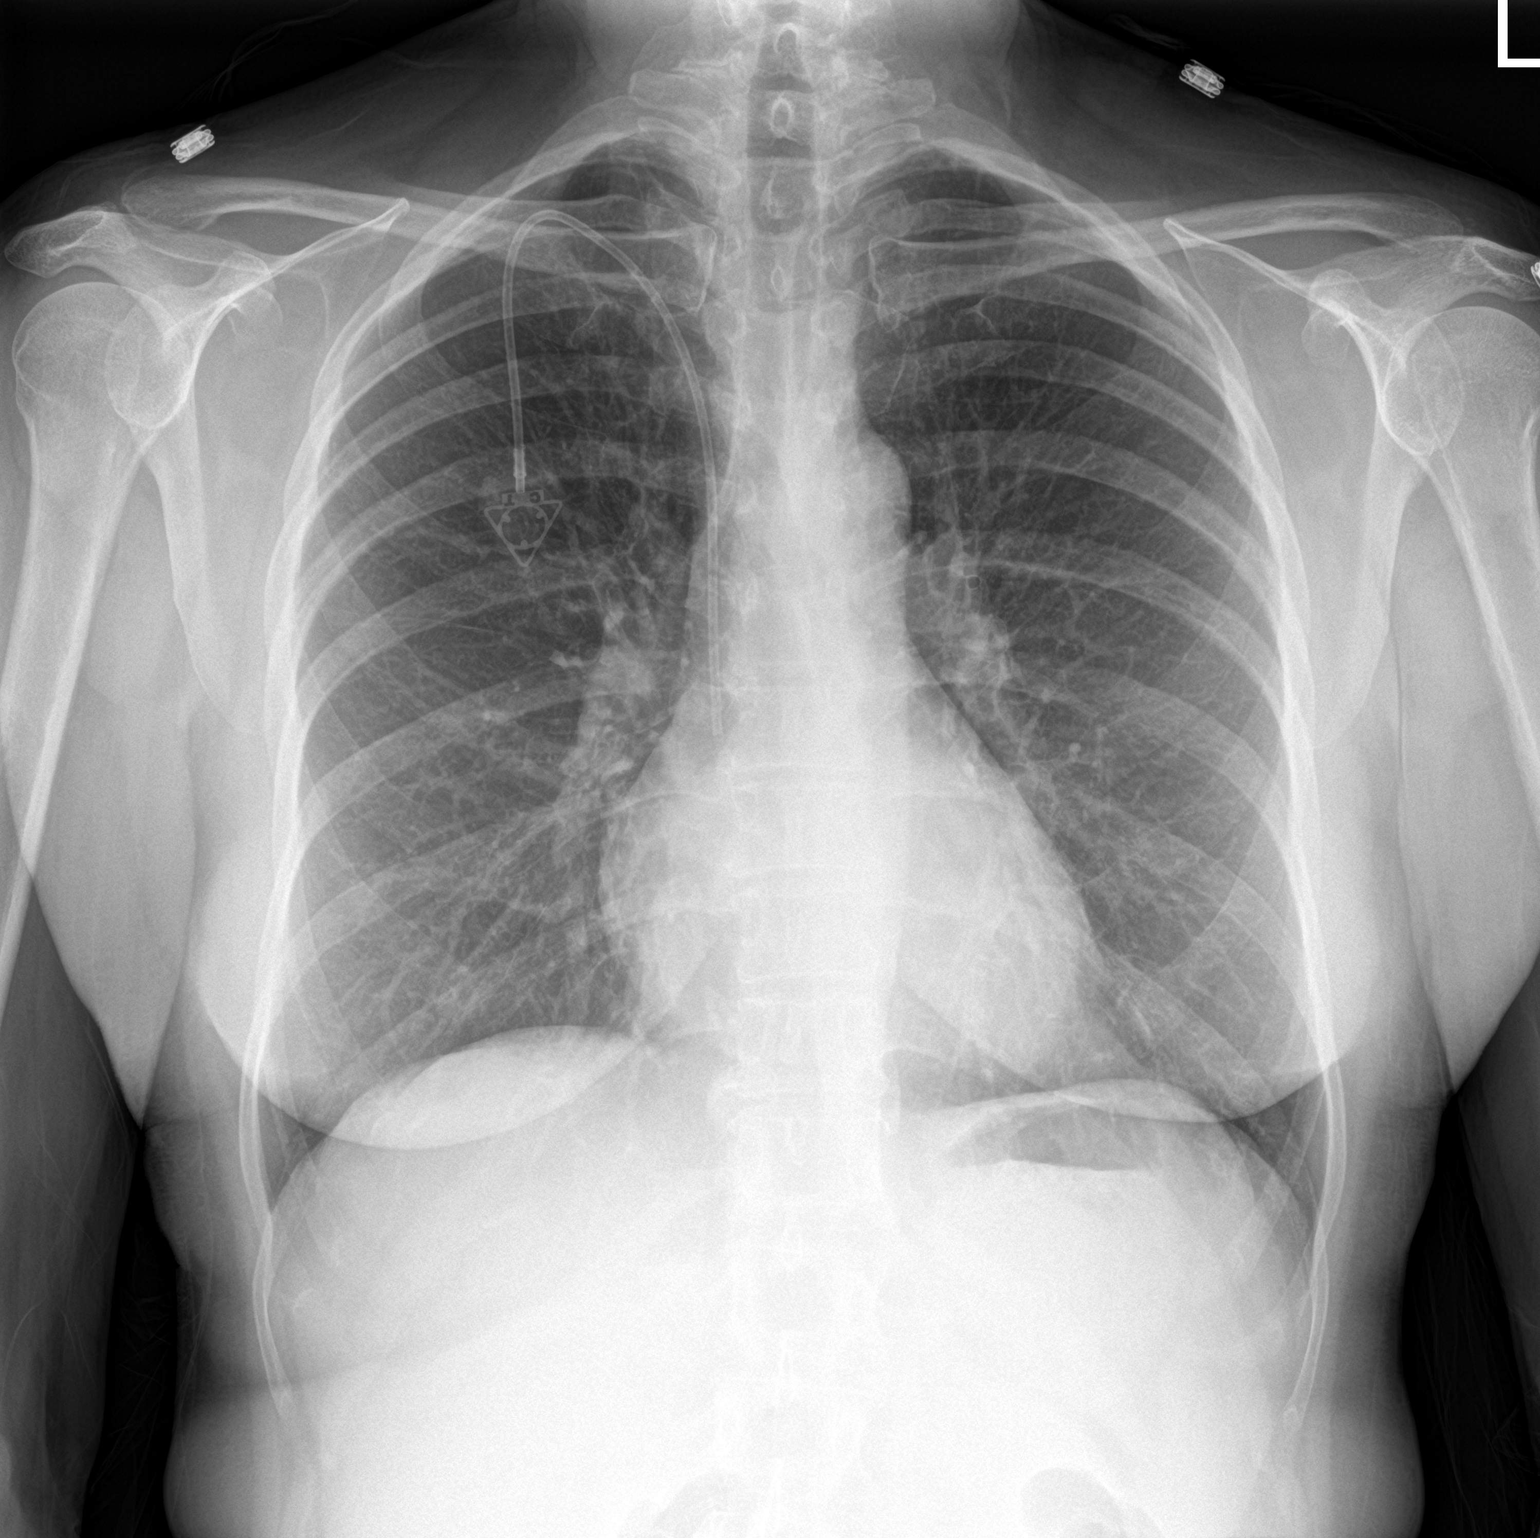

[chest lat]
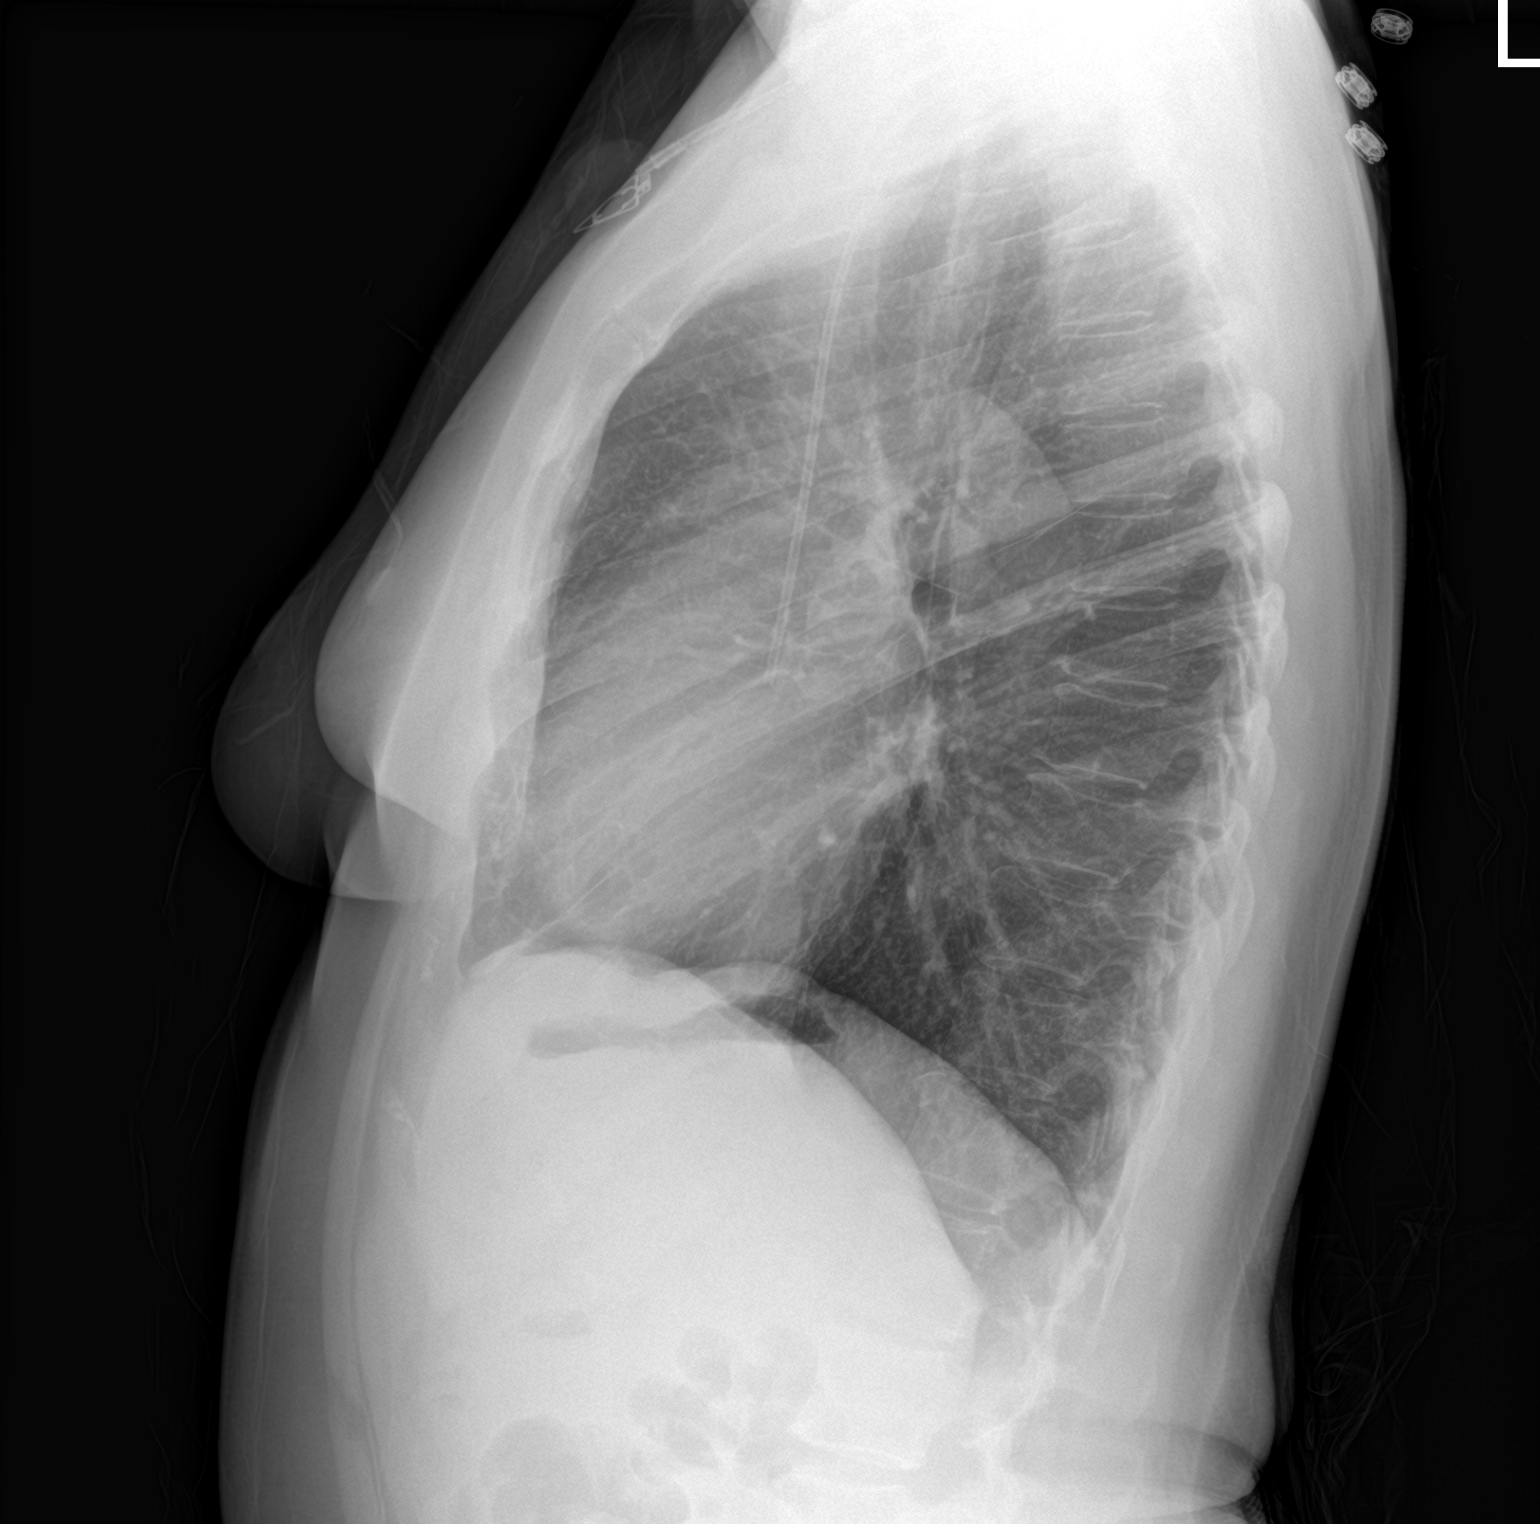

[2 of 2 positions shown; findings below may reference images not displayed]

FINDINGS: The right IJ power port is stable.

The cardiac silhouette, mediastinal and hilar contours are normal.
The lungs are clear. No pleural effusions or pulmonary lesions. The
bony thorax is intact.
IMPRESSION: Unremarkable and stable appearance of the chest.

## 2021-08-01 MED ORDER — ACYCLOVIR 400 MG PO TABS
200.0000 mg | ORAL_TABLET | Freq: Two times a day (BID) | ORAL | Status: DC
  Administered 2021-08-01 – 2021-08-06 (×11): 200 mg via ORAL
  Filled 2021-08-01 (×11): qty 1

## 2021-08-01 MED ORDER — AZITHROMYCIN 250 MG PO TABS: 500.0000 mg | ORAL_TABLET | Freq: Every day | ORAL | Status: DC

## 2021-08-01 MED ORDER — ENOXAPARIN SODIUM 40 MG/0.4ML IJ SOSY
40.0000 mg | PREFILLED_SYRINGE | INTRAMUSCULAR | Status: DC
  Administered 2021-08-01 – 2021-08-05 (×5): 40 mg via SUBCUTANEOUS
  Filled 2021-08-01 (×5): qty 0.4

## 2021-08-01 MED ORDER — TRAMADOL HCL 50 MG PO TABS
50.0000 mg | ORAL_TABLET | Freq: Four times a day (QID) | ORAL | Status: DC | PRN
  Administered 2021-08-04: 100 mg via ORAL
  Filled 2021-08-01: qty 2

## 2021-08-01 MED ORDER — DIPHENHYDRAMINE HCL 25 MG PO CAPS
25.0000 mg | ORAL_CAPSULE | Freq: Once | ORAL | Status: AC
  Administered 2021-08-01: 25 mg via ORAL

## 2021-08-01 MED ORDER — ACETAMINOPHEN 325 MG PO TABS
650.0000 mg | ORAL_TABLET | Freq: Once | ORAL | Status: AC
  Administered 2021-08-01: 650 mg via ORAL

## 2021-08-01 MED ORDER — SODIUM BICARBONATE 8.4 % IV SOLN
INTRAVENOUS | Status: DC
  Filled 2021-08-01 (×3): qty 150
  Filled 2021-08-01: qty 1000
  Filled 2021-08-01: qty 150
  Filled 2021-08-01: qty 1000
  Filled 2021-08-01 (×5): qty 150
  Filled 2021-08-01: qty 1000

## 2021-08-01 MED ORDER — SODIUM CHLORIDE 0.9 % IV SOLN
500.0000 mg/m2 | Freq: Once | INTRAVENOUS | Status: AC
  Administered 2021-08-01: 900 mg via INTRAVENOUS
  Filled 2021-08-01: qty 50

## 2021-08-01 MED ORDER — DIPHENHYDRAMINE HCL 50 MG/ML IJ SOLN: 25.0000 mg | Freq: Once | INTRAMUSCULAR | Status: DC

## 2021-08-01 MED ORDER — ONDANSETRON HCL 8 MG PO TABS
8.0000 mg | ORAL_TABLET | Freq: Three times a day (TID) | ORAL | Status: DC | PRN
  Administered 2021-08-02 – 2021-08-05 (×6): 8 mg via ORAL
  Filled 2021-08-01 (×6): qty 1

## 2021-08-01 MED ORDER — ACETAMINOPHEN 325 MG PO TABS
ORAL_TABLET | ORAL | Status: AC
  Filled 2021-08-01: qty 2

## 2021-08-01 MED ORDER — DOCUSATE SODIUM 100 MG PO CAPS
100.0000 mg | ORAL_CAPSULE | Freq: Two times a day (BID) | ORAL | Status: DC | PRN
  Filled 2021-08-01: qty 1

## 2021-08-01 MED ORDER — BIOTENE DRY MOUTH MT LIQD: 15.0000 mL | OROMUCOSAL | Status: DC | PRN

## 2021-08-01 MED ORDER — SODIUM CHLORIDE 0.9 % IV SOLN: INTRAVENOUS | Status: DC

## 2021-08-01 MED ORDER — ACETAMINOPHEN 500 MG PO TABS
500.0000 mg | ORAL_TABLET | Freq: Four times a day (QID) | ORAL | Status: DC | PRN
  Administered 2021-08-02 – 2021-08-05 (×5): 1000 mg via ORAL
  Filled 2021-08-01 (×5): qty 2

## 2021-08-01 MED ORDER — SODIUM CHLORIDE 0.9 % IV SOLN: 2.0000 g | INTRAVENOUS | Status: DC

## 2021-08-01 MED ORDER — DEXAMETHASONE 0.1 % OP SUSP
2.0000 [drp] | Freq: Three times a day (TID) | OPHTHALMIC | Status: DC
  Administered 2021-08-01 – 2021-08-06 (×14): 2 [drp] via OPHTHALMIC
  Filled 2021-08-01: qty 5

## 2021-08-01 MED ORDER — POTASSIUM CHLORIDE IN NACL 20-0.9 MEQ/L-% IV SOLN: INTRAVENOUS | Status: DC

## 2021-08-01 NOTE — Progress Notes (Signed)
Patient successfully admitted for her next cycle of MATRIX. Will follow for post discharge needs and office follow up.   Oncology Nurse Navigator Documentation     08/01/2021    9:15 AM  Oncology Nurse Navigator Flowsheets  Navigator Follow Up Date: 08/09/2021  Navigator Follow Up Reason: Appointment Review  Navigator Location [facility]  Navigator Encounter Type Appt/Treatment Plan Review  Patient Visit Type MedOnc  Treatment Phase Active Tx  Barriers/Navigation Needs Coordination of Care;Education  Interventions None Required  Acuity Level 2-Minimal Needs (1-2 Barriers Identified)  Support Groups/Services Friends and Family  Time Spent with Patient 15

## 2021-08-01 NOTE — Progress Notes (Addendum)
Per Mikey Bussing, NP ok to administer Rituxan as a Rapid Rituxan today. Starting at 147m/hr increase to 2069mhr after 30 minutes.   Rituxan administered and completed at 3:25pm. Patient tolerated treatment without any issues. See flowsheet for VS.

## 2021-08-01 NOTE — H&P (Addendum)
[place]  [phone] [phone]   MEDICAL ONCOLOGY - ADMISSION H&P  Reason for Admission: Diffuse large cell non-Hodgkin's lymphoma with CNS involvement, administration of systemic chemotherapy  HPI: Ms. 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  She previously received systemic chemotherapy with R-ICE/R-MTV and was then switched to the MATRIX protocol beginning on 05/02/2021.  Status post 3 cycles.  The patient was seen this morning and is feeling well.  She denies mucositis, abdominal pain, nausea, vomiting, constipation, diarrhea.  Denies headaches.  Eating and drinking well.  No bleeding reported.  She is being admitted today to begin her next cycle of chemotherapy with the matrix protocol.  Past Medical History:  Diagnosis Date   Chicken pox    Complication of anesthesia    Per patient, very slow to wake up after anesthesia   Goals of care, counseling/discussion 12/22/2020   High cholesterol    High grade B-cell lymphoma ([place]) 12/22/2020   Hyperglycemia 11/10/2020   Melanoma ([place]) 2021   right hip   Melanoma ([place]) 10/28/2020   pt stated brain liver and bladder   PONV (postoperative nausea and vomiting)    Urinary incontinence   :   Past Surgical History:  Procedure Laterality Date   AIR/FLUID EXCHANGE Right 12/07/2020   Procedure: AIR/FLUID EXCHANGE;  Surgeon: Jalene Mullet, MD;  Location: [facility];  Service: Ophthalmology;  Laterality: Right;   APPENDECTOMY  1962   BIOPSY  12/14/2020   Procedure: BIOPSY;  Surgeon: Rush Landmark Telford Nab., MD;  Location: [facility];  Service: Gastroenterology;;   ESOPHAGOGASTRODUODENOSCOPY (EGD) WITH PROPOFOL N/A 12/14/2020    Procedure: ESOPHAGOGASTRODUODENOSCOPY (EGD) WITH PROPOFOL;  Surgeon: Irving Copas., MD;  Location: [facility];  Service: Gastroenterology;  Laterality: N/A;   EXCISION MELANOMA WITH SENTINEL LYMPH NODE BIOPSY Right 10/09/2019   Procedure: WIDE LOCAL EXCISION WITH ADVANCEMENT FLAP CLOSURE RIGHT HIP MELANOMA WITH SENTINEL LYMPH NODE BIOPSY;  Surgeon: Stark Klein, MD;  Location: [facility];  Service: General;  Laterality: Right;   FINE NEEDLE ASPIRATION  12/14/2020   Procedure: FINE NEEDLE ASPIRATION (FNA) LINEAR;  Surgeon: Irving Copas., MD;  Location: [facility];  Service: Gastroenterology;;   HERNIA REPAIR  2009   IR IMAGING GUIDED PORT INSERTION  12/30/2020   MOUTH SURGERY  11/2015   gum surgery and bone implant   PARS PLANA VITRECTOMY Right 12/07/2020   Procedure: PARS PLANA VITRECTOMY WITH 25 GAUGE;  Surgeon: Jalene Mullet, MD;  Location: [facility];  Service: Ophthalmology;  Laterality: Right;   PHOTOCOAGULATION WITH LASER Right 12/07/2020   Procedure: PHOTOCOAGULATION WITH ENDOLASER PANRITINAL COAGULATION;  Surgeon: Jalene Mullet, MD;  Location: [facility];  Service: Ophthalmology;  Laterality: Right;   UPPER ESOPHAGEAL ENDOSCOPIC ULTRASOUND (EUS) N/A 12/14/2020   Procedure: UPPER ESOPHAGEAL ENDOSCOPIC ULTRASOUND (EUS);  Surgeon: Irving Copas., MD;  Location: [facility];  Service: Gastroenterology;  Laterality: N/A;   WISDOM TOOTH EXTRACTION    :   Current Facility-Administered Medications  Medication Dose Route Frequency Provider Last Rate Last Admin   0.9 % NaCl with KCl 20 mEq/ L  infusion   Intravenous Continuous Makela Niehoff, Rudell Cobb, MD       acetaminophen (TYLENOL) tablet 500-1,000 mg  500-1,000 mg Oral Q6H PRN Maryanna Shape,  NP       acyclovir (ZOVIRAX) tablet 200 mg  200 mg Oral BID Maryanna Shape, NP       antiseptic oral rinse (BIOTENE) solution 15 mL  15 mL Mouth Rinse Q4H PRN Curcio, Roselie Awkward, NP       azithromycin (ZITHROMAX) tablet 500 mg   500 mg Oral Daily Alyna Stensland, Rudell Cobb, MD       cefTRIAXone (ROCEPHIN) 2 g in sodium chloride 0.9 % 100 mL IVPB  2 g Intravenous Q24H Volanda Napoleon, MD       docusate sodium (COLACE) capsule 100 mg  100 mg Oral BID PRN Maryanna Shape, NP       enoxaparin (LOVENOX) injection 40 mg  40 mg Subcutaneous Q24H Volanda Napoleon, MD       ondansetron [facility]) tablet 8 mg  8 mg Oral Q8H PRN Maryanna Shape, NP       traMADol (ULTRAM) tablet 50-100 mg  50-100 mg Oral Q6H PRN Maryanna Shape, NP        Allergies  Allergen Reactions   Decadron [Dexamethasone] Other (See Comments)    Made the patient's legs tired and she could not walk   Plavix [Clopidogrel] Hives, Swelling and Other (See Comments)    Lips became swollen    Prednisone Other (See Comments)    Steroids caused psychological issues    Codeine Nausea Only  :   Family History  Problem Relation Age of Onset   Coronary artery disease Father        died at 33   COPD Father    Alcohol abuse Father    Diabetes Mellitus II Father    Coronary artery disease Sister    Cancer Sister        breast   Heart attack Brother    Hyperlipidemia Other        died 4- "natural causes"   Hyperlipidemia Other        4 siblings    Hypertension Other        3 siblings  :   Social History   Socioeconomic History   Marital status: Widowed    Spouse name: Not on file   Number of children: Not on file   Years of education: Not on file   Highest education level: Not on file  Occupational History   Occupation: retired  Tobacco Use   Smoking status: Never   Smokeless tobacco: Never  Vaping Use   Vaping Use: Never used  Substance and Sexual Activity   Alcohol use: Never   Drug use: Never   Sexual activity: Not Currently  Other Topics Concern   Not on file  Social History Narrative   05/09/2021   Presently living at [facility] in [place].   One Son- lives locally, 1 grand-daughterDaughter- [employer] 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   Social Determinants of Radio broadcast assistant Strain: Low Risk    Difficulty of Paying Living Expenses: Not hard at all  Food Insecurity: No Food Insecurity   Worried About Charity fundraiser in the Last Year: Never true   Arboriculturist in the Last Year: Never true  Transportation Needs: No Transportation Needs   Lack of Transportation (Medical): No   Lack of Transportation (Non-Medical): No  Physical Activity: Insufficiently Active   Days of Exercise per Week: 3 days   Minutes of Exercise per Session: 20 min  Stress: No  Stress Concern Present   Feeling of Stress : Not at all  Social Connections: Socially Isolated   Frequency of Communication with Friends and Family: More than three times a week   Frequency of Social Gatherings with Friends and Family: Once a week   Attends Religious Services: Never   Marine scientist or Organizations: No   Attends Archivist Meetings: Never   Marital Status: Widowed  Human resources officer Violence: Not At Risk   Fear of Current or Ex-Partner: No   Emotionally Abused: No   Physically Abused: No   Sexually Abused: No  :  Review of Systems: A comprehensive 14 point review of systems was negative except as noted in the HPI.  Exam: No data found.   General:  well-nourished in no acute distress.   Eyes:  no scleral icterus.   ENT:  There were no oropharyngeal lesions.   Lymphatics:  Negative cervical, supraclavicular or axillary adenopathy.   Respiratory: lungs were clear bilaterally without wheezing or crackles.   Cardiovascular:  Regular rate and rhythm, S1/S2, without murmur, rub or gallop.  There was no pedal edema.   GI:  abdomen was soft, flat, nontender, nondistended, without organomegaly.    Skin exam was without echymosis, petichae.   Neuro exam was nonfocal. Patient was alert and oriented.  Attention was good.   Language was appropriate.  Mood was normal without  depression.  Speech was not pressured.  Thought content was not tangential.     Lab Results  Component Value Date   WBC 4.7 07/21/2021   HGB 7.6 (L) 07/21/2021   HCT 22.2 (L) 07/21/2021   PLT 7 (LL) 07/21/2021   GLUCOSE 141 (H) 07/21/2021   CHOL 134 10/07/2020   TRIG 36 10/07/2020   HDL 44 10/07/2020   LDLCALC 83 10/07/2020   ALT 23 07/21/2021   AST 17 07/21/2021   NA 137 07/21/2021   K 3.8 07/21/2021   CL 102 07/21/2021   CREATININE 0.75 07/21/2021   BUN 15 07/21/2021   CO2 27 07/21/2021    No results found.   No results found.  Assessment and Plan:  1.  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  She feels well and we will plan to begin systemic chemotherapy with the MATRIX protocol today. -Obtain baseline lab work including CBC, CMET, magnesium, phosphorus, UA. -Begin normal saline with 20 mEq of potassium at 50 cc/h. -Lovenox for DVT prophylaxis. -Regular diet. -Continue home acyclovir 200 mg twice daily. -Continue home Colace and MiraLAX as needed. -Continue Biotene mouth rinse.  Mikey Bussing, DNP, AGPCNP-BC, AOCNP   ADDENDUM: I agree with the above assessment which is excellent by Erasmo Downer.  This will be her last cycle of MATRIX.  She does have problems with the left hip that will need surgery.  We will have to see what her labs look like tomorrow.  We need to start her on Decadron eyedrops.  I have asked pharmacy to do this for me.  We will make sure that her urine is appropriately alkaline before she receives the methotrexate.  Pharmacy, as always have done a incredible job with the sodium bicarb infusion.  I expect that she probably will be hospitalized for 4-maybe 5 days.  I know that she will get incredible care from the staff upon 6 E.  She loves the nurses on the floor because she knows that they are very compassionate and incredibly  smart.  [name], MD  Psalms  147:3

## 2021-08-01 NOTE — Plan of Care (Signed)
  Problem: Health Behavior/Discharge Planning: Goal: Ability to manage health-related needs will improve Outcome: Progressing   

## 2021-08-02 ENCOUNTER — Encounter: Payer: Self-pay | Admitting: Hematology & Oncology

## 2021-08-02 DIAGNOSIS — Z79899 Other long term (current) drug therapy: Secondary | ICD-10-CM | POA: Diagnosis not present

## 2021-08-02 DIAGNOSIS — C8332 Diffuse large B-cell lymphoma, intrathoracic lymph nodes: Secondary | ICD-10-CM | POA: Diagnosis not present

## 2021-08-02 LAB — COMPREHENSIVE METABOLIC PANEL
ALT: 24 U/L (ref 0–44)
AST: 26 U/L (ref 15–41)
Albumin: 3.2 g/dL — ABNORMAL LOW (ref 3.5–5.0)
Alkaline Phosphatase: 65 U/L (ref 38–126)
Anion gap: 5 (ref 5–15)
BUN: 9 mg/dL (ref 8–23)
CO2: 34 mmol/L — ABNORMAL HIGH (ref 22–32)
Calcium: 8.9 mg/dL (ref 8.9–10.3)
Chloride: 100 mmol/L (ref 98–111)
Creatinine, Ser: 0.61 mg/dL (ref 0.44–1.00)
GFR, Estimated: >60 mL/min (ref >60)
Glucose, Bld: 122 mg/dL — ABNORMAL HIGH (ref 70–99)
Potassium: 3.4 mmol/L — ABNORMAL LOW (ref 3.5–5.1)
Sodium: 139 mmol/L (ref 135–145)
Total Bilirubin: 0.3 mg/dL (ref 0.3–1.2)
Total Protein: 5.4 g/dL — ABNORMAL LOW (ref 6.5–8.1)

## 2021-08-02 LAB — URINALYSIS, DIPSTICK ONLY
Bacteria, UA: NONE SEEN
Bilirubin Urine: NEGATIVE
Glucose, UA: NEGATIVE mg/dL
Hgb urine dipstick: NEGATIVE
Ketones, ur: NEGATIVE mg/dL
Nitrite: NEGATIVE
Protein, ur: NEGATIVE mg/dL
Specific Gravity, Urine: 1.008 (ref 1.005–1.030)
pH: 9 — ABNORMAL HIGH (ref 5.0–8.0)

## 2021-08-02 MED ORDER — METHOTREXATE SODIUM (PF) CHEMO INJECTION 1 GM/40ML
500.0000 mg/m2 | Freq: Once | INTRAVENOUS | Status: AC
  Administered 2021-08-02: 905 mg via INTRAVENOUS
  Filled 2021-08-02: qty 36.2

## 2021-08-02 MED ORDER — CHLORHEXIDINE GLUCONATE CLOTH 2 % EX PADS
6.0000 | MEDICATED_PAD | Freq: Every day | CUTANEOUS | Status: DC
  Administered 2021-08-02 – 2021-08-06 (×5): 6 via TOPICAL

## 2021-08-02 MED ORDER — SODIUM CHLORIDE 0.9 % IV SOLN
Freq: Once | INTRAVENOUS | Status: AC
  Administered 2021-08-02: 16 mg via INTRAVENOUS
  Filled 2021-08-02: qty 8

## 2021-08-02 MED ORDER — METHOTREXATE SODIUM (PF) CHEMO INJECTION 1 GM/40ML
3.0000 g/m2 | Freq: Once | INTRAVENOUS | Status: AC
  Administered 2021-08-02: 5.43 g via INTRAVENOUS
  Filled 2021-08-02: qty 217.2

## 2021-08-02 NOTE — TOC Initial Note (Signed)
Transition of Care [facility]) - Initial/Assessment Note    Patient Details  Name: Ariel [name] MRN: [id number] Date of Birth: [birth year]  Transition of Care [facility]) CM/SW Contact:    Andres Escandon, Marjie Skiff, RN Phone Number: 08/02/2021, 3:12 PM  Clinical Narrative:                 Pt is from [place] at [facility] and will return there at dc.  Expected Discharge Plan: Home/Self Care Barriers to Discharge: Continued Medical Work up   Patient Goals and CMS Choice Patient states their goals for this hospitalization and ongoing recovery are:: To go home      Expected Discharge Plan and Services Expected Discharge Plan: Home/Self Care   Discharge Planning Services: CM Consult   Living arrangements for the past 2 months: [place]                                      Prior Living Arrangements/Services Living arrangements for the past 2 months: [place] Lives with:: Self Patient language and need for interpreter reviewed:: Yes        Need for Family Participation in Patient Care: Yes (Comment) Care giver support system in place?: Yes (comment)   Criminal Activity/Legal Involvement Pertinent to Current Situation/Hospitalization: No - Comment as needed  Activities of Daily Living Home Assistive Devices/Equipment: Cane (specify quad or straight) ADL Screening (condition at time of admission) Patient's cognitive ability adequate to safely complete daily activities?: Yes Is the patient deaf or have difficulty hearing?: No Does the patient have difficulty seeing, even when wearing glasses/contacts?: No Does the patient have difficulty concentrating, remembering, or making decisions?: No Patient able to express need for assistance with ADLs?: Yes Does the patient have difficulty dressing or bathing?: Yes Independently performs ADLs?: Yes (appropriate for developmental age) Does the patient have difficulty walking or climbing stairs?:  No Weakness of Legs: None Weakness of Arms/Hands: None  Permission Sought/Granted                  Emotional Assessment Appearance:: Appears stated age Attitude/Demeanor/Rapport: Gracious Affect (typically observed): Calm Orientation: : Oriented to Self, Oriented to Place, Oriented to  Time, Oriented to Situation Alcohol / Substance Use: Not Applicable Psych Involvement: No (comment)  Admission diagnosis:  Diffuse large B cell lymphoma ([place]) [C83.30] Patient Active Problem List   Diagnosis Date Noted   Pancytopenia ([place]) 05/16/2021   Lymphoma malignant, 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   Diffuse large B cell lymphoma ([place]) 03/07/2021   Encounter for antineoplastic chemotherapy 03/07/2021   AMS (altered mental status) 01/14/2021   Neutropenia ([place]) 01/14/2021   High grade B-cell lymphoma ([place]) 12/22/2020   Goals of care, counseling/discussion 22/29/7989   Acute metabolic encephalopathy 21/19/4174   Brain metastasis 12/09/2020   Brain tumor ([place]) 12/08/2020   Hyponatremia 12/08/2020   Night sweat 11/30/2020   Chicken pox 10/10/4816   Complication of anesthesia 11/22/2020   PONV (postoperative nausea and vomiting) 11/22/2020   Skin rash 11/10/2020   Tachycardia 11/10/2020   Anemia 11/10/2020   Hyperglycemia 11/10/2020   TIA (transient ischemic attack) 10/07/2020   Iron deficiency anemia 10/07/2020   Aphasia 10/06/2020   Melanoma ([place]) 2021   Multinodular goiter 12/22/2013   Subclinical hyperthyroidism 12/19/2013   Urinary incontinence 11/19/2013   Hyperlipidemia 11/19/2012   Routine general medical examination at a health care facility 11/18/2012   PCP:  Inda Castle,  Lenna Sciara, NP Pharmacy:   [place], [address] [place] [address] Phone: [phone] Fax: [phone]     Social Determinants of Health (SDOH) Interventions    Readmission Risk Interventions    08/02/2021    3:11  PM 07/05/2021    2:17 PM 06/08/2021   11:18 AM  Readmission Risk Prevention Plan  Transportation Screening Complete Complete Complete  Medication Review ([place]) Complete Complete Complete  PCP or Specialist appointment within 3-5 days of discharge Complete Complete Complete  HRI or Home Care Consult Complete Complete Complete  SW Recovery Care/Counseling Consult Complete Complete Complete  Palliative Care Screening Not Applicable Not Applicable Not [place] Not Applicable Not Applicable Not Applicable

## 2021-08-02 NOTE — Plan of Care (Signed)
  Problem: Health Behavior/Discharge Planning: Goal: Ability to manage health-related needs will improve Outcome: Progressing   

## 2021-08-02 NOTE — Progress Notes (Signed)
So far, Ariel [name] has had no problems.  She will start the methotrexate today.  We do not have back the urine pH.  She is on a bicarb infusion.  She had no problems with nausea or vomiting last night.  She had no issues with fever.  There is no cough.  She does have the pain in the left hip from the avascular necrosis.  At some point, she will need surgery for this.  She has had no change in bowel or bladder habits.  She has had no rashes.  No lab work back yet.  Her vital signs are temperature 98.2.  Pulse 84.  Blood pressure 109/64.  Oral exam shows no mucositis.  Lungs are clear bilaterally.  Cardiac exam regular rate and rhythm.  Abdomen is soft.  She has good bowel sounds.  There is no fluid wave.  There is no palpable liver or spleen tip.  Neurological exam is nonfocal.  Skin exam shows no rashes.  Again, she is supposed to start the methotrexate today.  We need to have the urine pH back.  She is to have labs back today.  I know that she is getting incredible care from all the staff upon 6 E.  [name], MD  Colossians [phone]

## 2021-08-03 ENCOUNTER — Encounter: Payer: Self-pay | Admitting: Hematology & Oncology

## 2021-08-03 DIAGNOSIS — C8330 Diffuse large B-cell lymphoma, unspecified site: Secondary | ICD-10-CM

## 2021-08-03 DIAGNOSIS — D509 Iron deficiency anemia, unspecified: Secondary | ICD-10-CM

## 2021-08-03 DIAGNOSIS — Z79899 Other long term (current) drug therapy: Secondary | ICD-10-CM | POA: Diagnosis not present

## 2021-08-03 LAB — COMPREHENSIVE METABOLIC PANEL
ALT: 25 U/L (ref 0–44)
AST: 27 U/L (ref 15–41)
Albumin: 3.1 g/dL — ABNORMAL LOW (ref 3.5–5.0)
Alkaline Phosphatase: 67 U/L (ref 38–126)
Anion gap: 6 (ref 5–15)
BUN: 9 mg/dL (ref 8–23)
CO2: 35 mmol/L — ABNORMAL HIGH (ref 22–32)
Calcium: 8.7 mg/dL — ABNORMAL LOW (ref 8.9–10.3)
Chloride: 98 mmol/L (ref 98–111)
Creatinine, Ser: 0.63 mg/dL (ref 0.44–1.00)
GFR, Estimated: >60 mL/min (ref >60)
Glucose, Bld: 107 mg/dL — ABNORMAL HIGH (ref 70–99)
Potassium: 3.2 mmol/L — ABNORMAL LOW (ref 3.5–5.1)
Sodium: 139 mmol/L (ref 135–145)
Total Bilirubin: 0.6 mg/dL (ref 0.3–1.2)
Total Protein: 5.4 g/dL — ABNORMAL LOW (ref 6.5–8.1)

## 2021-08-03 LAB — CBC WITH DIFFERENTIAL/PLATELET
Abs Immature Granulocytes: 0.02 10*3/uL (ref 0.00–0.07)
Basophils Absolute: 0 10*3/uL (ref 0.0–0.1)
Basophils Relative: 0 %
Eosinophils Absolute: 0 10*3/uL (ref 0.0–0.5)
Eosinophils Relative: 0 %
HCT: 22.6 % — ABNORMAL LOW (ref 36.0–46.0)
Hemoglobin: 7.7 g/dL — ABNORMAL LOW (ref 12.0–15.0)
Immature Granulocytes: 1 %
Lymphocytes Relative: 22 %
Lymphs Abs: 0.7 10*3/uL (ref 0.7–4.0)
MCH: 32.5 pg (ref 26.0–34.0)
MCHC: 34.1 g/dL (ref 30.0–36.0)
MCV: 95.4 fL (ref 80.0–100.0)
Monocytes Absolute: 0.5 10*3/uL (ref 0.1–1.0)
Monocytes Relative: 16 %
Neutro Abs: 1.8 10*3/uL (ref 1.7–7.7)
Neutrophils Relative %: 61 %
Platelets: 104 10*3/uL — ABNORMAL LOW (ref 150–400)
RBC: 2.37 MIL/uL — ABNORMAL LOW (ref 3.87–5.11)
RDW: 19.8 % — ABNORMAL HIGH (ref 11.5–15.5)
WBC: 3 10*3/uL — ABNORMAL LOW (ref 4.0–10.5)
nRBC: 0 % (ref 0.0–0.2)

## 2021-08-03 LAB — URINALYSIS, DIPSTICK ONLY
Bilirubin Urine: NEGATIVE
Glucose, UA: NEGATIVE mg/dL
Hgb urine dipstick: NEGATIVE
Ketones, ur: NEGATIVE mg/dL
Leukocytes,Ua: NEGATIVE
Nitrite: NEGATIVE
Protein, ur: NEGATIVE mg/dL
Specific Gravity, Urine: 1.008 (ref 1.005–1.030)
pH: 9 — ABNORMAL HIGH (ref 5.0–8.0)

## 2021-08-03 LAB — URIC ACID: Uric Acid, Serum: 3.7 mg/dL (ref 2.5–7.1)

## 2021-08-03 LAB — PREPARE RBC (CROSSMATCH)

## 2021-08-03 LAB — METHOTREXATE: Methotrexate: 3.33

## 2021-08-03 MED ORDER — SODIUM CHLORIDE 0.9 % IV SOLN
3000.0000 mg | Freq: Two times a day (BID) | INTRAVENOUS | Status: AC
  Administered 2021-08-03 (×2): 3000 mg via INTRAVENOUS
  Filled 2021-08-03 (×3): qty 150

## 2021-08-03 MED ORDER — FUROSEMIDE 10 MG/ML IJ SOLN
20.0000 mg | Freq: Once | INTRAMUSCULAR | Status: AC
  Filled 2021-08-03: qty 2

## 2021-08-03 MED ORDER — LEUCOVORIN CALCIUM INJECTION 100 MG
15.0000 mg/m2 | Freq: Four times a day (QID) | INTRAMUSCULAR | Status: AC
  Administered 2021-08-03 – 2021-08-04 (×4): 28 mg via INTRAVENOUS
  Filled 2021-08-03 (×8): qty 1.4

## 2021-08-03 MED ORDER — SODIUM CHLORIDE 0.9% IV SOLUTION: Freq: Once | INTRAVENOUS | Status: AC

## 2021-08-03 MED ORDER — FUROSEMIDE 10 MG/ML IJ SOLN
20.0000 mg | Freq: Once | INTRAMUSCULAR | Status: AC
  Administered 2021-08-03: 20 mg via INTRAVENOUS

## 2021-08-03 MED ORDER — SODIUM CHLORIDE 0.9 % IV SOLN
Freq: Once | INTRAVENOUS | Status: AC
  Administered 2021-08-03: 16 mg via INTRAVENOUS
  Filled 2021-08-03: qty 8

## 2021-08-03 MED ORDER — SODIUM CHLORIDE 0.9 % IV SOLN
150.0000 mg | Freq: Once | INTRAVENOUS | Status: AC
  Administered 2021-08-03: 150 mg via INTRAVENOUS
  Filled 2021-08-03: qty 5

## 2021-08-03 NOTE — Progress Notes (Signed)
  Progress Note   Date: 08/03/2021  Patient Name: Ariel [name]        MRN#: [id number]  Review the patient's clinical findings supports the diagnosis of:   antineoplastic chemotherapy induced pancytopenia   This is all from her chemotherapy

## 2021-08-03 NOTE — Progress Notes (Signed)
Ariel [name] is doing okay.  She had no problems with the methotrexate.  She will start the leucovorin today.  She typically gets rid of the methotrexate quickly.  Her hemoglobin is only 7.7.  I think were going to have to transfuse her.  I think her blood count will drop even further because of her treatments.  She little bit of nausea yesterday.  Antiemetics did seem to help.  She has had no fever.  There is been no cough or shortness of breath.  She has had no diarrhea.  There is no rashes.  Her potassium is 3.2.  I will just watch this for right now.  Her white cell count is 3.0.  Her platelet count is 104,000.  She has had no mouth sores.  Her appetite has been pretty decent.  She still has the pain in the left hip from the avascular necrosis.  Tylenol seems to help this.  She has had no headache.  There is been no visual changes.  Her vital signs show temperature of 98.2.  Pulse 75.  Blood pressure 105/49.  Her head neck exam shows no ocular or oral lesions.  She has no adenopathy in the neck.  Lungs are clear bilaterally.  Cardiac exam regular rate and rhythm.  Abdomen is soft.  Bowel sounds are present.  There is no guarding or rebound tenderness.  There is no fluid wave.  Extremity shows no clubbing, cyanosis or edema.  We will go ahead and transfuse today.  She will get 2 units of blood.  She will start the leucovorin rescue.  I think she starts the cytarabine today.  Again, she is getting outstanding care from the wonderful staff upon 6 E.  [name], MD  Psalms 50:15

## 2021-08-03 NOTE — Progress Notes (Signed)
Chemotherapy dosages and calculations verified with Vikki Ports, RN

## 2021-08-04 LAB — TYPE AND SCREEN
ABO/RH(D): A POS
Antibody Screen: NEGATIVE
Unit division: 0
Unit division: 0

## 2021-08-04 LAB — COMPREHENSIVE METABOLIC PANEL
ALT: 67 U/L — ABNORMAL HIGH (ref 0–44)
AST: 66 U/L — ABNORMAL HIGH (ref 15–41)
Albumin: 3.5 g/dL (ref 3.5–5.0)
Alkaline Phosphatase: 69 U/L (ref 38–126)
Anion gap: 10 (ref 5–15)
BUN: 10 mg/dL (ref 8–23)
CO2: 33 mmol/L — ABNORMAL HIGH (ref 22–32)
Calcium: 9 mg/dL (ref 8.9–10.3)
Chloride: 94 mmol/L — ABNORMAL LOW (ref 98–111)
Creatinine, Ser: 0.66 mg/dL (ref 0.44–1.00)
GFR, Estimated: >60 mL/min (ref >60)
Glucose, Bld: 195 mg/dL — ABNORMAL HIGH (ref 70–99)
Potassium: 3.1 mmol/L — ABNORMAL LOW (ref 3.5–5.1)
Sodium: 137 mmol/L (ref 135–145)
Total Bilirubin: 0.9 mg/dL (ref 0.3–1.2)
Total Protein: 6 g/dL — ABNORMAL LOW (ref 6.5–8.1)

## 2021-08-04 LAB — CBC WITH DIFFERENTIAL/PLATELET
Abs Immature Granulocytes: 0.09 10*3/uL — ABNORMAL HIGH (ref 0.00–0.07)
Basophils Absolute: 0 10*3/uL (ref 0.0–0.1)
Basophils Relative: 0 %
Eosinophils Absolute: 0 10*3/uL (ref 0.0–0.5)
Eosinophils Relative: 0 %
HCT: 31 % — ABNORMAL LOW (ref 36.0–46.0)
Hemoglobin: 10.6 g/dL — ABNORMAL LOW (ref 12.0–15.0)
Immature Granulocytes: 1 %
Lymphocytes Relative: 2 %
Lymphs Abs: 0.2 10*3/uL — ABNORMAL LOW (ref 0.7–4.0)
MCH: 30.8 pg (ref 26.0–34.0)
MCHC: 34.2 g/dL (ref 30.0–36.0)
MCV: 90.1 fL (ref 80.0–100.0)
Monocytes Absolute: 0.1 10*3/uL (ref 0.1–1.0)
Monocytes Relative: 1 %
Neutro Abs: 9.5 10*3/uL — ABNORMAL HIGH (ref 1.7–7.7)
Neutrophils Relative %: 96 %
Platelets: 99 10*3/uL — ABNORMAL LOW (ref 150–400)
RBC: 3.44 MIL/uL — ABNORMAL LOW (ref 3.87–5.11)
RDW: 19.3 % — ABNORMAL HIGH (ref 11.5–15.5)
WBC: 10 10*3/uL (ref 4.0–10.5)
nRBC: 0 % (ref 0.0–0.2)

## 2021-08-04 LAB — BPAM RBC
Blood Product Expiration Date: 202306272359
Blood Product Expiration Date: 202306272359
ISSUE DATE / TIME: 202306071605
ISSUE DATE / TIME: 202306072342
Unit Type and Rh: 6200
Unit Type and Rh: 6200

## 2021-08-04 LAB — URINALYSIS, DIPSTICK ONLY
Bilirubin Urine: NEGATIVE
Glucose, UA: NEGATIVE mg/dL
Hgb urine dipstick: NEGATIVE
Ketones, ur: NEGATIVE mg/dL
Leukocytes,Ua: NEGATIVE
Nitrite: NEGATIVE
Protein, ur: NEGATIVE mg/dL
Specific Gravity, Urine: 1.009 (ref 1.005–1.030)
pH: 9 — ABNORMAL HIGH (ref 5.0–8.0)

## 2021-08-04 MED ORDER — SODIUM CHLORIDE 0.9 % IV SOLN
Freq: Once | INTRAVENOUS | Status: AC
  Administered 2021-08-04: 16 mg via INTRAVENOUS
  Filled 2021-08-04: qty 8

## 2021-08-04 MED ORDER — LEUCOVORIN CALCIUM INJECTION 100 MG
15.0000 mg/m2 | Freq: Four times a day (QID) | INTRAMUSCULAR | Status: AC
  Administered 2021-08-04 – 2021-08-05 (×4): 28 mg via INTRAVENOUS
  Filled 2021-08-04 (×5): qty 1.4

## 2021-08-04 MED ORDER — FUROSEMIDE 10 MG/ML IJ SOLN
INTRAMUSCULAR | Status: AC
  Administered 2021-08-04: 20 mg via INTRAVENOUS
  Filled 2021-08-04: qty 2

## 2021-08-04 MED ORDER — SODIUM CHLORIDE 0.9 % IV SOLN
3000.0000 mg | Freq: Two times a day (BID) | INTRAVENOUS | Status: AC
  Administered 2021-08-04 (×2): 3000 mg via INTRAVENOUS
  Filled 2021-08-04 (×2): qty 150
  Filled 2021-08-04: qty 30

## 2021-08-04 NOTE — Care Management Important Message (Signed)
Important Message  Patient Details IM Letter given to the Patient Name: Ariel [name] MRN: [id number] Date of Birth: 1955-12-02   Medicare Important Message Given:  Yes     [name] 08/04/2021, 9:32 AM

## 2021-08-05 ENCOUNTER — Encounter: Payer: Self-pay | Admitting: Hematology & Oncology

## 2021-08-05 LAB — COMPREHENSIVE METABOLIC PANEL WITH GFR
ALT: 234 U/L — ABNORMAL HIGH (ref 0–44)
AST: 210 U/L — ABNORMAL HIGH (ref 15–41)
Albumin: 2.9 g/dL — ABNORMAL LOW (ref 3.5–5.0)
Alkaline Phosphatase: 58 U/L (ref 38–126)
Anion gap: 8 (ref 5–15)
BUN: 13 mg/dL (ref 8–23)
CO2: 34 mmol/L — ABNORMAL HIGH (ref 22–32)
Calcium: 9 mg/dL (ref 8.9–10.3)
Chloride: 94 mmol/L — ABNORMAL LOW (ref 98–111)
Creatinine, Ser: 0.66 mg/dL (ref 0.44–1.00)
GFR, Estimated: >60 mL/min
Glucose, Bld: 147 mg/dL — ABNORMAL HIGH (ref 70–99)
Potassium: 3.1 mmol/L — ABNORMAL LOW (ref 3.5–5.1)
Sodium: 136 mmol/L (ref 135–145)
Total Bilirubin: 0.7 mg/dL (ref 0.3–1.2)
Total Protein: 5.4 g/dL — ABNORMAL LOW (ref 6.5–8.1)

## 2021-08-05 LAB — MAGNESIUM: Magnesium: 2 mg/dL (ref 1.7–2.4)

## 2021-08-05 LAB — CBC WITH DIFFERENTIAL/PLATELET
Abs Immature Granulocytes: 0.04 10*3/uL (ref 0.00–0.07)
Basophils Absolute: 0 10*3/uL (ref 0.0–0.1)
Basophils Relative: 0 %
Eosinophils Absolute: 0 10*3/uL (ref 0.0–0.5)
Eosinophils Relative: 0 %
HCT: 26.4 % — ABNORMAL LOW (ref 36.0–46.0)
Hemoglobin: 9.2 g/dL — ABNORMAL LOW (ref 12.0–15.0)
Immature Granulocytes: 1 %
Lymphocytes Relative: 1 %
Lymphs Abs: 0.1 10*3/uL — ABNORMAL LOW (ref 0.7–4.0)
MCH: 31.3 pg (ref 26.0–34.0)
MCHC: 34.8 g/dL (ref 30.0–36.0)
MCV: 89.8 fL (ref 80.0–100.0)
Monocytes Absolute: 0 10*3/uL — ABNORMAL LOW (ref 0.1–1.0)
Monocytes Relative: 0 %
Neutro Abs: 4.3 10*3/uL (ref 1.7–7.7)
Neutrophils Relative %: 98 %
Platelets: 76 10*3/uL — ABNORMAL LOW (ref 150–400)
RBC: 2.94 MIL/uL — ABNORMAL LOW (ref 3.87–5.11)
RDW: 18.1 % — ABNORMAL HIGH (ref 11.5–15.5)
WBC: 4.4 10*3/uL (ref 4.0–10.5)
nRBC: 0 % (ref 0.0–0.2)

## 2021-08-05 LAB — URINALYSIS, DIPSTICK ONLY
Bilirubin Urine: NEGATIVE
Glucose, UA: NEGATIVE mg/dL
Hgb urine dipstick: NEGATIVE
Ketones, ur: NEGATIVE mg/dL
Leukocytes,Ua: NEGATIVE
Nitrite: NEGATIVE
Protein, ur: 30 mg/dL — AB
Specific Gravity, Urine: 1.014 (ref 1.005–1.030)
pH: 9 — ABNORMAL HIGH (ref 5.0–8.0)

## 2021-08-05 LAB — METHOTREXATE: Methotrexate: 0.09

## 2021-08-05 MED ORDER — THIOTEPA CHEMO INJECTION 15 MG
54.0000 mg | Freq: Once | INTRAVENOUS | Status: AC
  Administered 2021-08-05: 54 mg via INTRAVENOUS
  Filled 2021-08-05: qty 5.19

## 2021-08-05 MED ORDER — LEUCOVORIN CALCIUM INJECTION 100 MG
15.0000 mg/m2 | Freq: Three times a day (TID) | INTRAMUSCULAR | Status: AC
  Administered 2021-08-05 – 2021-08-06 (×3): 28 mg via INTRAVENOUS
  Filled 2021-08-05 (×6): qty 1.4

## 2021-08-05 MED ORDER — LORAZEPAM 2 MG/ML IJ SOLN
1.0000 mg | Freq: Once | INTRAMUSCULAR | Status: AC
  Administered 2021-08-05: 1 mg via INTRAVENOUS
  Filled 2021-08-05: qty 1

## 2021-08-05 MED ORDER — SODIUM CHLORIDE 0.9 % IV SOLN
8.0000 mg | Freq: Three times a day (TID) | INTRAVENOUS | Status: DC
  Administered 2021-08-05 – 2021-08-06 (×4): 8 mg via INTRAVENOUS
  Filled 2021-08-05 (×8): qty 4

## 2021-08-05 NOTE — Progress Notes (Signed)
Okay to proceed with thiotepa prior to CMET and CBC resulting today per Dr. Marin Olp.

## 2021-08-05 NOTE — Progress Notes (Signed)
She is having a lot of problems with nausea this morning.  She had problems with nausea last night.  I will give her some scheduled Zofran.  I will give her a dose of Ativan this morning.  She just has the thiotepa to take.  She does not have labs back yet this morning.  We will have to see what they look like.  She has had no diarrhea.  She has had no pain.  There is been no cough or shortness of breath.  Her last methotrexate level 0.29.  As always, she metabolizes the methotrexate fairly quickly.  As she is does this, her liver test to go up a little bit.  She has had no bleeding.  There is been no increased pain in the left hip.  She has had no arthralgias or myalgias.  Her vital signs are temperature 98.1.  Pulse 82.  Blood pressure 110/54.  Her oral exam does not show any mucositis.  Her lungs are clear bilaterally.  Cardiac exam regular rate and rhythm.  Abdomen soft.  Bowel sounds are slightly decreased.  There is no obvious fluid wave.  Extremity shows no clubbing, cyanosis or edema.  Neurological exam is nonfocal.  Hopefully, the nausea will get better today with the Zofran and Ativan.  Again, we cannot use Decadron because of her change in mental status with steroids.  She will get the final dose of thiotepa today.  Hopefully, we will be able to discharge her tomorrow.  A lot will depend on the nausea.  I do appreciate the great care she is getting from the staff up on 6 E.  Kerby Nora, MD  Proverbs 12:25

## 2021-08-06 ENCOUNTER — Encounter: Payer: Self-pay | Admitting: Hematology & Oncology

## 2021-08-06 LAB — CBC WITH DIFFERENTIAL/PLATELET
Abs Immature Granulocytes: 0.01 10*3/uL (ref 0.00–0.07)
Basophils Absolute: 0 10*3/uL (ref 0.0–0.1)
Basophils Relative: 0 %
Eosinophils Absolute: 0 10*3/uL (ref 0.0–0.5)
Eosinophils Relative: 0 %
HCT: 25.4 % — ABNORMAL LOW (ref 36.0–46.0)
Hemoglobin: 8.7 g/dL — ABNORMAL LOW (ref 12.0–15.0)
Immature Granulocytes: 1 %
Lymphocytes Relative: 4 %
Lymphs Abs: 0.1 10*3/uL — ABNORMAL LOW (ref 0.7–4.0)
MCH: 31.2 pg (ref 26.0–34.0)
MCHC: 34.3 g/dL (ref 30.0–36.0)
MCV: 91 fL (ref 80.0–100.0)
Monocytes Absolute: 0 10*3/uL — ABNORMAL LOW (ref 0.1–1.0)
Monocytes Relative: 0 %
Neutro Abs: 1.7 10*3/uL (ref 1.7–7.7)
Neutrophils Relative %: 95 %
Platelets: 66 10*3/uL — ABNORMAL LOW (ref 150–400)
RBC: 2.79 MIL/uL — ABNORMAL LOW (ref 3.87–5.11)
RDW: 17.7 % — ABNORMAL HIGH (ref 11.5–15.5)
WBC: 1.8 10*3/uL — ABNORMAL LOW (ref 4.0–10.5)
nRBC: 0 % (ref 0.0–0.2)

## 2021-08-06 LAB — COMPREHENSIVE METABOLIC PANEL
ALT: 235 U/L — ABNORMAL HIGH (ref 0–44)
AST: 180 U/L — ABNORMAL HIGH (ref 15–41)
Albumin: 2.9 g/dL — ABNORMAL LOW (ref 3.5–5.0)
Alkaline Phosphatase: 56 U/L (ref 38–126)
Anion gap: 8 (ref 5–15)
BUN: 14 mg/dL (ref 8–23)
CO2: 36 mmol/L — ABNORMAL HIGH (ref 22–32)
Calcium: 9 mg/dL (ref 8.9–10.3)
Chloride: 98 mmol/L (ref 98–111)
Creatinine, Ser: 0.65 mg/dL (ref 0.44–1.00)
GFR, Estimated: >60 mL/min (ref >60)
Glucose, Bld: 131 mg/dL — ABNORMAL HIGH (ref 70–99)
Potassium: 3.2 mmol/L — ABNORMAL LOW (ref 3.5–5.1)
Sodium: 142 mmol/L (ref 135–145)
Total Bilirubin: 0.6 mg/dL (ref 0.3–1.2)
Total Protein: 5.5 g/dL — ABNORMAL LOW (ref 6.5–8.1)

## 2021-08-06 LAB — URINALYSIS, DIPSTICK ONLY
Bilirubin Urine: NEGATIVE
Glucose, UA: NEGATIVE mg/dL
Hgb urine dipstick: NEGATIVE
Ketones, ur: NEGATIVE mg/dL
Leukocytes,Ua: NEGATIVE
Nitrite: NEGATIVE
Specific Gravity, Urine: 1.01 (ref 1.005–1.030)
pH: 8.5 — ABNORMAL HIGH (ref 5.0–8.0)

## 2021-08-06 LAB — METHOTREXATE
Methotrexate: 0.29
Methotrexate: 0.07

## 2021-08-06 MED ORDER — SODIUM CHLORIDE 0.9 % IV SOLN
150.0000 mg | Freq: Once | INTRAVENOUS | Status: AC
  Administered 2021-08-06: 150 mg via INTRAVENOUS
  Filled 2021-08-06: qty 5

## 2021-08-06 MED ORDER — CIPROFLOXACIN HCL 500 MG PO TABS: 500.0000 mg | ORAL_TABLET | Freq: Every day | ORAL | 0 refills | Status: DC

## 2021-08-06 MED ORDER — HEPARIN SOD (PORK) LOCK FLUSH 100 UNIT/ML IV SOLN
500.0000 [IU] | Freq: Once | INTRAVENOUS | Status: AC
Start: 2021-08-06 — End: 2021-08-06
  Administered 2021-08-06: 500 [IU] via INTRAVENOUS
  Filled 2021-08-06: qty 5

## 2021-08-06 NOTE — Discharge Summary (Signed)
Physician Discharge Summary  Patient ID: Ariel [name] '@[email or web address]'$ @ MRN: [id number] DOB/AGE: October 01, 1955 66 y.o.  Admit date: 08/01/2021 Discharge date: 08/06/2021  Admission Diagnoses: Diffuse large B cell non-Hodgkin's lymphoma-status post cycle #4 of chemotherapy with MATRIX  Discharge Diagnoses:  Principal Problem:   Diffuse large B cell lymphoma ([place])   Discharged Condition: good  Discharge Labs:   Significant Diagnostic Studies: None  Consults: None  Disposition: Discharge disposition: 01-Home or Self Care       Treatments: Chemotherapy as in the medical record  Allergies as of 08/06/2021       Reactions   Decadron [dexamethasone] Other (See Comments)   Made the patient's legs tired and she could not walk. Hallucinations   Plavix [clopidogrel] Hives, Swelling, Other (See Comments)   Lips became swollen   Prednisone Other (See Comments)   Steroids caused psychological issues  Hallucinations   Codeine Nausea And Vomiting        Medication List     STOP taking these medications    acetaminophen 500 MG tablet Commonly known as: TYLENOL       TAKE these medications    acyclovir 200 MG capsule Commonly known as: ZOVIRAX TAKE 1 CAPSULE TWICE A DAY.   antiseptic oral rinse Liqd 15 mLs by Mouth Rinse route every 4 (four) hours. What changed:  when to take this reasons to take this   ciprofloxacin 500 MG tablet Commonly known as: Cipro Take 1 tablet (500 mg total) by mouth daily with breakfast.   docusate sodium 100 MG capsule Commonly known as: COLACE Take 1 capsule (100 mg total) by mouth 2 (two) times daily as needed for mild constipation. What changed: when to take this   fluconazole 100 MG tablet Commonly known as: DIFLUCAN Take 1 tablet (100 mg total) by mouth daily.   ondansetron 8 MG tablet Commonly known as: ZOFRAN Take 1 tablet (8 mg total) by mouth every 8 (eight) hours as needed for nausea or vomiting.   OVER THE COUNTER  MEDICATION Take 3 capsules by mouth See admin instructions. Balance of Nature VEGETABLE formulation capsules- Take 3 capsules by mouth every morning   OVER THE COUNTER MEDICATION Take 3 capsules by mouth See admin instructions. Balance of Nature FRUIT formulation capsules- Take 3 capsules by mouth every morning   traMADol 50 MG tablet Commonly known as: ULTRAM Take one (50 mg) to two (100 mg) tablets every six hours as needed for pain   Vuity 1.25 % Soln Generic drug: Pilocarpine HCl Place 1 drop into both eyes in the morning and at bedtime.         Follow-up Information     Volanda Napoleon, MD Follow up in 3 day(s).   Specialty: Oncology Why: Went to the office on Monday for labs visit and likely transfusions Contact information: [place] [address] [phone]                 Hospital Course: Ariel [name] was admitted on 08/01/2021.  This was her 4th cycle of chemotherapy with MATRIX.  She had her Port-A-Cath accessed.  She was started on IV fluids.  We gave her sodium bicarb in the IV fluids to get a urine alkalize.  She received Rituxan the first day.  The second day, and she received high-dose methotrexate.  She was then put on leucovorin rescue.  She received this every 6 hours.  Her initial methotrexate level was 7.9.  We doubled the dose  of leucovorin and her level next day was down to 0.7.  As always, and she managed to excrete the leucovorin very quickly..  She then received high-dose cytarabine.  We gave her Decadron eyedrops for this to help protect her eyes.  She had no problems with the Decadron eyedrops.  She had no problems with the cytarabine.  She did have some nausea.  This was little bit more than usual for her.  I really have not all the supplies as this is her fourth chemotherapy treatment.  We put Zofran on a schedule for her.  This is seem to help quite a bit.  She then received her dose of thioTEPA.  She got this  when she was getting a blood transfusion.  I transfused her with her hemoglobin of 8.1.  The blood transfusion helped her quite a bit.  She had no problems with diarrhea.  The arthritis in the left hip was not much of a problem for her.  She has avascular necrosis.  She was eating a little bit better on the morning of discharge.  I did go ahead and give her a dose of [place].  Her labs on discharge show white cell count 1.8.  Hemoglobin 8.7.  Platelet count 66,000.  Her SGPT was 235 SGOT 180.  Bilirubin was 0.6.  Her BUN was 14 creatinine 0.65.  Potassium is 3.2.  I do not think we had to replace her potassium.  Her labs look good.  Her liver function studies are always on the elevated side after the methotrexate.    Discharge Exam: Blood pressure (!) 111/52, pulse 65, temperature 98.9 F (37.2 C), temperature source Oral, resp. rate 18, height '5\' 6"'$  (1.676 m), weight 155 lb 6.8 oz (70.5 kg), SpO2 93 %. Physical Exam Vitals reviewed.  HENT:     Head: Normocephalic and atraumatic.  Eyes:     Pupils: Pupils are equal, round, and reactive to light.  Cardiovascular:     Rate and Rhythm: Normal rate and regular rhythm.     Heart sounds: Normal heart sounds.  Pulmonary:     Effort: Pulmonary effort is normal.     Breath sounds: Normal breath sounds.  Abdominal:     General: Bowel sounds are normal.     Palpations: Abdomen is soft.  Musculoskeletal:        General: No tenderness or deformity. Normal range of motion.     Cervical back: Normal range of motion.  Lymphadenopathy:     Cervical: No cervical adenopathy.  Skin:    General: Skin is warm and dry.     Findings: No erythema or rash.  Neurological:     Mental Status: She is alert and oriented to person, place, and time.  Psychiatric:        Behavior: Behavior normal.        Thought Content: Thought content normal.        Judgment: Judgment normal.      Discharge Instructions     PHARMACY COMMUNICATION ONCOLOGY    Complete by: As directed    Infuse cytarabine over 3 hours to decrease possibility of CNS ADEs       Signed: Volanda Napoleon 08/06/2021, 7:15 AM

## 2021-08-06 NOTE — TOC Transition Note (Addendum)
Transition of Care [facility]) - CM/SW Discharge Note   Patient Details  Name: Ariel [name] MRN: [id number] Date of Birth: 1955-03-06  Transition of Care [facility]) CM/SW Contact:  Leeroy Cha, RN Phone Number: 08/06/2021, 8:19 AM   Clinical Narrative:    Patient discharged to go home to [facility] Is in the independent living section and has a hh aide in place.     Barriers to Discharge: Barriers Resolved   Patient Goals and CMS Choice Patient states their goals for this hospitalization and ongoing recovery are:: To go home      Discharge Placement                       Discharge Plan and Services   Discharge Planning Services: CM Consult                                 Social Determinants of Health (SDOH) Interventions     Readmission Risk Interventions    08/02/2021    3:11 PM 07/05/2021    2:17 PM 06/08/2021   11:18 AM  Readmission Risk Prevention Plan  Transportation Screening Complete Complete Complete  Medication Review Press photographer) Complete Complete Complete  PCP or Specialist appointment within 3-5 days of discharge Complete Complete Complete  HRI or Home Care Consult Complete Complete Complete  SW Recovery Care/Counseling Consult Complete Complete Complete  Palliative Care Screening Not Applicable Not Applicable Not Page Not Applicable Not Applicable Not Applicable

## 2021-08-08 ENCOUNTER — Telehealth: Payer: Self-pay | Admitting: *Deleted

## 2021-08-08 ENCOUNTER — Encounter: Payer: Self-pay | Admitting: Family

## 2021-08-08 ENCOUNTER — Inpatient Hospital Stay: Payer: Medicare Other

## 2021-08-08 ENCOUNTER — Inpatient Hospital Stay (HOSPITAL_BASED_OUTPATIENT_CLINIC_OR_DEPARTMENT_OTHER): Payer: Medicare Other | Admitting: Family

## 2021-08-08 ENCOUNTER — Other Ambulatory Visit: Payer: Self-pay

## 2021-08-08 ENCOUNTER — Other Ambulatory Visit: Payer: Self-pay | Admitting: Oncology

## 2021-08-08 ENCOUNTER — Inpatient Hospital Stay: Payer: Medicare Other | Attending: Internal Medicine

## 2021-08-08 VITALS — BP 122/56 | HR 60 | Temp 98.6°F | Resp 20

## 2021-08-08 VITALS — BP 122/56 | HR 60 | Temp 98.6°F | Resp 20 | Ht 66.0 in | Wt 155.6 lb

## 2021-08-08 DIAGNOSIS — Z79899 Other long term (current) drug therapy: Secondary | ICD-10-CM | POA: Diagnosis not present

## 2021-08-08 DIAGNOSIS — C8332 Diffuse large B-cell lymphoma, intrathoracic lymph nodes: Secondary | ICD-10-CM | POA: Diagnosis not present

## 2021-08-08 DIAGNOSIS — C8330 Diffuse large B-cell lymphoma, unspecified site: Secondary | ICD-10-CM | POA: Diagnosis not present

## 2021-08-08 DIAGNOSIS — D500 Iron deficiency anemia secondary to blood loss (chronic): Secondary | ICD-10-CM

## 2021-08-08 DIAGNOSIS — C8510 Unspecified B-cell lymphoma, unspecified site: Secondary | ICD-10-CM

## 2021-08-08 DIAGNOSIS — Z9221 Personal history of antineoplastic chemotherapy: Secondary | ICD-10-CM | POA: Insufficient documentation

## 2021-08-08 DIAGNOSIS — Z95828 Presence of other vascular implants and grafts: Secondary | ICD-10-CM

## 2021-08-08 LAB — CBC WITH DIFFERENTIAL (CANCER CENTER ONLY)
Abs Immature Granulocytes: 0.01 10*3/uL (ref 0.00–0.07)
Basophils Absolute: 0 10*3/uL (ref 0.0–0.1)
Basophils Relative: 0 %
Eosinophils Absolute: 0 10*3/uL (ref 0.0–0.5)
Eosinophils Relative: 2 %
HCT: 25.3 % — ABNORMAL LOW (ref 36.0–46.0)
Hemoglobin: 8.7 g/dL — ABNORMAL LOW (ref 12.0–15.0)
Immature Granulocytes: 2 %
Lymphocytes Relative: 10 %
Lymphs Abs: 0.1 10*3/uL — ABNORMAL LOW (ref 0.7–4.0)
MCH: 31.3 pg (ref 26.0–34.0)
MCHC: 34.4 g/dL (ref 30.0–36.0)
MCV: 91 fL (ref 80.0–100.0)
Monocytes Absolute: 0 10*3/uL — ABNORMAL LOW (ref 0.1–1.0)
Monocytes Relative: 2 %
Neutro Abs: 0.4 10*3/uL — CL (ref 1.7–7.7)
Neutrophils Relative %: 84 %
Platelet Count: 44 10*3/uL — ABNORMAL LOW (ref 150–400)
RBC: 2.78 MIL/uL — ABNORMAL LOW (ref 3.87–5.11)
RDW: 16.8 % — ABNORMAL HIGH (ref 11.5–15.5)
Smear Review: NORMAL
WBC Count: 0.5 10*3/uL — CL (ref 4.0–10.5)
nRBC: 0 % (ref 0.0–0.2)

## 2021-08-08 LAB — CMP (CANCER CENTER ONLY)
ALT: 168 U/L — ABNORMAL HIGH (ref 0–44)
AST: 90 U/L — ABNORMAL HIGH (ref 15–41)
Albumin: 4 g/dL (ref 3.5–5.0)
Alkaline Phosphatase: 78 U/L (ref 38–126)
Anion gap: 7 (ref 5–15)
BUN: 17 mg/dL (ref 8–23)
CO2: 30 mmol/L (ref 22–32)
Calcium: 9.7 mg/dL (ref 8.9–10.3)
Chloride: 101 mmol/L (ref 98–111)
Creatinine: 0.67 mg/dL (ref 0.44–1.00)
GFR, Estimated: >60 mL/min (ref >60)
Glucose, Bld: 99 mg/dL (ref 70–99)
Potassium: 4.2 mmol/L (ref 3.5–5.1)
Sodium: 138 mmol/L (ref 135–145)
Total Bilirubin: 0.6 mg/dL (ref 0.3–1.2)
Total Protein: 6.1 g/dL — ABNORMAL LOW (ref 6.5–8.1)

## 2021-08-08 LAB — SAMPLE TO BLOOD BANK

## 2021-08-08 LAB — LACTATE DEHYDROGENASE: LDH: 287 U/L — ABNORMAL HIGH (ref 98–192)

## 2021-08-08 MED ORDER — PEGFILGRASTIM-BMEZ 6 MG/0.6ML ~~LOC~~ SOSY
6.0000 mg | PREFILLED_SYRINGE | Freq: Once | SUBCUTANEOUS | Status: AC
  Administered 2021-08-08: 6 mg via SUBCUTANEOUS
  Filled 2021-08-08: qty 0.6

## 2021-08-08 MED ORDER — HEPARIN SOD (PORK) LOCK FLUSH 100 UNIT/ML IV SOLN
500.0000 [IU] | Freq: Once | INTRAVENOUS | Status: AC
  Administered 2021-08-08: 500 [IU] via INTRAVENOUS

## 2021-08-08 MED ORDER — SODIUM CHLORIDE 0.9% FLUSH
10.0000 mL | Freq: Once | INTRAVENOUS | Status: AC
  Administered 2021-08-08: 10 mL via INTRAVENOUS

## 2021-08-08 NOTE — Addendum Note (Signed)
Addended by: Amelia Jo I on: 08/08/2021 09:50 AM   Modules accepted: Orders

## 2021-08-08 NOTE — Patient Instructions (Signed)

## 2021-08-08 NOTE — Telephone Encounter (Signed)
Critical ANC reported by [facility] in lab of 0.4.  Dr Marin Olp aware.  PAtient here to get WBC booster.

## 2021-08-08 NOTE — Progress Notes (Signed)
Hematology and Oncology Follow Up Visit  Ariel [name] [id number] 09-02-1955 66 y.o. 08/08/2021   Principle Diagnosis:  Diffuse large cell non-Hodgkin's lymphoma-CNS involvement   Current Therapy:        Status post chemotherapy with R-ICE/R-MTV --  s/p cycle #4 MATRIX -- s/p cycle 4 -- started 05/02/2021 - completed 08/01/2021 Neulasta 6 mg subcu post chemotherapy   Interim History:  Ariel [name] is here today for follow-up. She completed Ariel [name] last cycle of MATRIX last week and is doing quite well.  ANC is down at 0.4 and she will get Neulasta today.  No fever, chills, n/v, cough, rash, dizziness, SOB, chest pain, palpitations, abdominal pain or changes in bowel or bladder habits.  She states that she has mild fatigue for a few days after each cycle and also noted mild diarrhea for a day after.  No swelling, tenderness, numbness or tingling in Ariel [name] extremities at this time.  No abnormal blood loss. No bruising or petechiae.  No falls or syncope reported.  Ariel [name] appetite is good but food still has a metallic flavor. She is staying well hydrated. Ariel [name] weight is stable at 155 lbs.  She is looking forward to visiting Ariel [name] sweet grand baby in [place] this summer!  ECOG Performance Status: 1 - Symptomatic but completely ambulatory  Medications:  Allergies as of 08/08/2021       Reactions   Decadron [dexamethasone] Other (See Comments)   Made the patient's legs tired and she could not walk. Hallucinations   Plavix [clopidogrel] Hives, Swelling, Other (See Comments)   Lips became swollen   Prednisone Other (See Comments)   Steroids caused psychological issues  Hallucinations   Codeine Nausea And Vomiting        Medication List        Accurate as of August 08, 2021  9:38 AM. If you have any questions, ask your nurse or doctor.          STOP taking these medications    fluconazole 100 MG tablet Commonly known as: DIFLUCAN Stopped by: Lottie Dawson, NP       TAKE these  medications    acyclovir 200 MG capsule Commonly known as: ZOVIRAX TAKE 1 CAPSULE TWICE A DAY.   antiseptic oral rinse Liqd 15 mLs by Mouth Rinse route every 4 (four) hours. What changed:  when to take this reasons to take this   ciprofloxacin 500 MG tablet Commonly known as: Cipro Take 1 tablet (500 mg total) by mouth daily with breakfast.   docusate sodium 100 MG capsule Commonly known as: COLACE Take 1 capsule (100 mg total) by mouth 2 (two) times daily as needed for mild constipation. What changed: when to take this   ondansetron 8 MG tablet Commonly known as: ZOFRAN Take 1 tablet (8 mg total) by mouth every 8 (eight) hours as needed for nausea or vomiting.   OVER THE COUNTER MEDICATION Take 3 capsules by mouth See admin instructions. Balance of Nature VEGETABLE formulation capsules- Take 3 capsules by mouth every morning   OVER THE COUNTER MEDICATION Take 3 capsules by mouth See admin instructions. Balance of Nature FRUIT formulation capsules- Take 3 capsules by mouth every morning   traMADol 50 MG tablet Commonly known as: ULTRAM Take one (50 mg) to two (100 mg) tablets every six hours as needed for pain   Vuity 1.25 % Soln Generic drug: Pilocarpine HCl Place 1 drop into both eyes in the morning and at bedtime.  Allergies:  Allergies  Allergen Reactions   Decadron [Dexamethasone] Other (See Comments)    Made the patient's legs tired and she could not walk. Hallucinations   Plavix [Clopidogrel] Hives, Swelling and Other (See Comments)    Lips became swollen    Prednisone Other (See Comments)    Steroids caused psychological issues  Hallucinations   Codeine Nausea And Vomiting    Past Medical History, Surgical history, Social history, and Family History were reviewed and updated.  Review of Systems: All other 10 point review of systems is negative.   Physical Exam:  height is '5\' 6"'$  (1.676 m) and weight is 155 lb 9.6 oz (70.6 kg). Ariel [name] oral  temperature is 98.6 F (37 C). Ariel [name] blood pressure is 122/56 (abnormal) and Ariel [name] pulse is 60. Ariel [name] respiration is 20 and oxygen saturation is 99%.   Wt Readings from Last 3 Encounters:  08/08/21 155 lb 9.6 oz (70.6 kg)  08/06/21 155 lb 6.8 oz (70.5 kg)  07/21/21 153 lb (69.4 kg)    Ocular: Sclerae unicteric, pupils equal, round and reactive to light Ear-nose-throat: Oropharynx clear, dentition fair Lymphatic: No cervical or supraclavicular adenopathy Lungs no rales or rhonchi, good excursion bilaterally Heart regular rate and rhythm, no murmur appreciated Abd soft, nontender, positive bowel sounds MSK no focal spinal tenderness, no joint edema Neuro: non-focal, well-oriented, appropriate affect Breasts: Deferred   Lab Results  Component Value Date   WBC 0.5 (LL) 08/08/2021   HGB 8.7 (L) 08/08/2021   HCT 25.3 (L) 08/08/2021   MCV 91.0 08/08/2021   PLT 44 (L) 08/08/2021   Lab Results  Component Value Date   FERRITIN 1,033 (H) 04/07/2021   IRON 224 (H) 05/17/2021   TIBC 246 (L) 05/17/2021   UIBC 22 05/17/2021   IRONPCTSAT 91 (H) 05/17/2021   Lab Results  Component Value Date   RETICCTPCT 0.4 05/09/2021   RBC 2.78 (L) 08/08/2021   No results found for: "KPAFRELGTCHN", "LAMBDASER", "KAPLAMBRATIO" No results found for: "IGGSERUM", "IGA", "IGMSERUM" No results found for: "TOTALPROTELP", "ALBUMINELP", "A1GS", "A2GS", "BETS", "BETA2SER", "GAMS", "MSPIKE", "SPEI"   Chemistry      Component Value Date/Time   NA 138 08/08/2021 0827   K 4.2 08/08/2021 0827   CL 101 08/08/2021 0827   CO2 30 08/08/2021 0827   BUN 17 08/08/2021 0827   CREATININE 0.67 08/08/2021 0827   CREATININE 0.68 11/18/2012 1439      Component Value Date/Time   CALCIUM 9.7 08/08/2021 0827   ALKPHOS 78 08/08/2021 0827   AST 90 (H) 08/08/2021 0827   ALT 168 (H) 08/08/2021 0827   BILITOT 0.6 08/08/2021 0827       Impression and Plan: Ariel [name] is a very pleasant 66 yo caucasian female with history of  diffuse large cell non-Hodgkin's lymphoma that had travelled to Ariel [name] brain. She completed 4 cycles of R-ICE alternating with R-MTV. She then completed Ariel [name] 4th and final cycle of MATRIX last week.  She received Neulasta today for ANC 0.4.  We will recheck Ariel [name] lab work later this week on Thursday.  Follow-up with MD in 1 month.  Lottie Dawson, NP 6/12/20239:38 AM

## 2021-08-09 ENCOUNTER — Encounter: Payer: Self-pay | Admitting: *Deleted

## 2021-08-09 NOTE — Progress Notes (Signed)
Patient discharged from the hospital over the weekend and was seen in the office yesterday for her neulasta. She will follow up in about one month. No plans at this time for further inpatient treatment.  Oncology Nurse Navigator Documentation     08/09/2021    9:15 AM  Oncology Nurse Navigator Flowsheets  Navigator Follow Up Date: 09/05/2021  Navigator Follow Up Reason: Follow-up Appointment  Navigator Location [facility]  Navigator Encounter Type Appt/Treatment Plan Review  Patient Visit Type MedOnc  Treatment Phase Active Tx  Barriers/Navigation Needs No Barriers At This Time  Interventions None Required  Acuity Level 1-No Barriers  Support Groups/Services Friends and Family  Time Spent with Patient 15

## 2021-08-11 ENCOUNTER — Telehealth: Payer: Self-pay | Admitting: *Deleted

## 2021-08-11 ENCOUNTER — Other Ambulatory Visit: Payer: Self-pay | Admitting: Oncology

## 2021-08-11 ENCOUNTER — Inpatient Hospital Stay: Payer: Medicare Other

## 2021-08-11 ENCOUNTER — Other Ambulatory Visit: Payer: Self-pay | Admitting: *Deleted

## 2021-08-11 VITALS — BP 124/52 | HR 69 | Temp 98.8°F | Resp 17

## 2021-08-11 DIAGNOSIS — Z95828 Presence of other vascular implants and grafts: Secondary | ICD-10-CM

## 2021-08-11 DIAGNOSIS — D649 Anemia, unspecified: Secondary | ICD-10-CM

## 2021-08-11 DIAGNOSIS — C8510 Unspecified B-cell lymphoma, unspecified site: Secondary | ICD-10-CM

## 2021-08-11 DIAGNOSIS — C8332 Diffuse large B-cell lymphoma, intrathoracic lymph nodes: Secondary | ICD-10-CM

## 2021-08-11 DIAGNOSIS — Z79899 Other long term (current) drug therapy: Secondary | ICD-10-CM | POA: Diagnosis not present

## 2021-08-11 DIAGNOSIS — D500 Iron deficiency anemia secondary to blood loss (chronic): Secondary | ICD-10-CM

## 2021-08-11 DIAGNOSIS — Z9221 Personal history of antineoplastic chemotherapy: Secondary | ICD-10-CM | POA: Diagnosis not present

## 2021-08-11 DIAGNOSIS — C8330 Diffuse large B-cell lymphoma, unspecified site: Secondary | ICD-10-CM | POA: Diagnosis not present

## 2021-08-11 LAB — CMP (CANCER CENTER ONLY)
ALT: 85 U/L — ABNORMAL HIGH (ref 0–44)
AST: 29 U/L (ref 15–41)
Albumin: 4.3 g/dL (ref 3.5–5.0)
Alkaline Phosphatase: 84 U/L (ref 38–126)
Anion gap: 7 (ref 5–15)
BUN: 21 mg/dL (ref 8–23)
CO2: 29 mmol/L (ref 22–32)
Calcium: 9.9 mg/dL (ref 8.9–10.3)
Chloride: 101 mmol/L (ref 98–111)
Creatinine: 0.72 mg/dL (ref 0.44–1.00)
GFR, Estimated: >60 mL/min (ref >60)
Glucose, Bld: 99 mg/dL (ref 70–99)
Potassium: 4.6 mmol/L (ref 3.5–5.1)
Sodium: 137 mmol/L (ref 135–145)
Total Bilirubin: 0.6 mg/dL (ref 0.3–1.2)
Total Protein: 6.9 g/dL (ref 6.5–8.1)

## 2021-08-11 LAB — CBC WITH DIFFERENTIAL (CANCER CENTER ONLY)
Abs Immature Granulocytes: 0 10*3/uL (ref 0.00–0.07)
Basophils Absolute: 0 10*3/uL (ref 0.0–0.1)
Basophils Relative: 0 %
Eosinophils Absolute: 0 10*3/uL (ref 0.0–0.5)
Eosinophils Relative: 4 %
HCT: 24.2 % — ABNORMAL LOW (ref 36.0–46.0)
Hemoglobin: 8.3 g/dL — ABNORMAL LOW (ref 12.0–15.0)
Lymphocytes Relative: 68 %
Lymphs Abs: 0.1 10*3/uL — ABNORMAL LOW (ref 0.7–4.0)
MCH: 30.9 pg (ref 26.0–34.0)
MCHC: 34.3 g/dL (ref 30.0–36.0)
MCV: 90 fL (ref 80.0–100.0)
Monocytes Absolute: 0 10*3/uL — ABNORMAL LOW (ref 0.1–1.0)
Monocytes Relative: 0 %
Neutro Abs: 0 10*3/uL — CL (ref 1.7–7.7)
Neutrophils Relative %: 28 %
Platelet Count: 9 10*3/uL — CL (ref 150–400)
RBC: 2.69 MIL/uL — ABNORMAL LOW (ref 3.87–5.11)
RDW: 16.5 % — ABNORMAL HIGH (ref 11.5–15.5)
Smear Review: NORMAL
WBC Count: 0.1 10*3/uL — CL (ref 4.0–10.5)
nRBC: 0 % (ref 0.0–0.2)

## 2021-08-11 LAB — LACTATE DEHYDROGENASE: LDH: 188 U/L (ref 98–192)

## 2021-08-11 LAB — SAMPLE TO BLOOD BANK

## 2021-08-11 LAB — PREPARE RBC (CROSSMATCH)

## 2021-08-11 MED ORDER — HEPARIN SOD (PORK) LOCK FLUSH 100 UNIT/ML IV SOLN: 500.0000 [IU] | Freq: Every day | INTRAVENOUS | Status: DC | PRN

## 2021-08-11 MED ORDER — HEPARIN SOD (PORK) LOCK FLUSH 100 UNIT/ML IV SOLN: 500.0000 [IU] | Freq: Once | INTRAVENOUS | Status: DC

## 2021-08-11 MED ORDER — HEPARIN SOD (PORK) LOCK FLUSH 100 UNIT/ML IV SOLN
500.0000 [IU] | Freq: Every day | INTRAVENOUS | Status: AC | PRN
  Administered 2021-08-11: 500 [IU]

## 2021-08-11 MED ORDER — SODIUM CHLORIDE 0.9% FLUSH: 10.0000 mL | Freq: Once | INTRAVENOUS | Status: DC

## 2021-08-11 MED ORDER — DIPHENHYDRAMINE HCL 25 MG PO CAPS: 25.0000 mg | ORAL_CAPSULE | Freq: Once | ORAL | Status: DC

## 2021-08-11 MED ORDER — SODIUM CHLORIDE 0.9% FLUSH
10.0000 mL | INTRAVENOUS | Status: AC | PRN
  Administered 2021-08-11: 10 mL

## 2021-08-11 MED ORDER — FUROSEMIDE 10 MG/ML IJ SOLN: 20.0000 mg | Freq: Once | INTRAMUSCULAR | Status: DC

## 2021-08-11 MED ORDER — SODIUM CHLORIDE 0.9% IV SOLUTION
250.0000 mL | Freq: Once | INTRAVENOUS | Status: AC
  Administered 2021-08-11: 250 mL via INTRAVENOUS

## 2021-08-11 MED ORDER — ACETAMINOPHEN 325 MG PO TABS: 650.0000 mg | ORAL_TABLET | Freq: Once | ORAL | Status: DC

## 2021-08-11 NOTE — Patient Instructions (Signed)

## 2021-08-11 NOTE — Patient Instructions (Signed)
Blood Transfusion, Adult A blood transfusion is a procedure in which you receive blood or a type of blood cell (blood component) through an IV. You may need a blood transfusion when your blood level is low. This may result from a bleeding disorder, illness, injury, or surgery. The blood may come from a donor. You may also be able to donate blood for yourself (autologous blood donation) before a planned surgery. The blood given in a transfusion is made up of different blood components. You may receive: Red blood cells. These carry oxygen to the cells in the body. Platelets. These help your blood to clot. Plasma. This is the liquid part of your blood. It carries proteins and other substances throughout the body. White blood cells. These help you fight infections. If you have hemophilia or another clotting disorder, you may also receive other types of blood products. Tell a health care provider about: Any blood disorders you have. Any previous reactions you have had during a blood transfusion. Any allergies you have. All medicines you are taking, including vitamins, herbs, eye drops, creams, and over-the-counter medicines. Any surgeries you have had. Any medical conditions you have, including any recent fever or cold symptoms. Whether you are pregnant or may be pregnant. What are the risks? Generally, this is a safe procedure. However, problems may occur. The most common problems include: A mild allergic reaction, such as red, swollen areas of skin (hives) and itching. Fever or chills. This may be the body's response to new blood cells received. This may occur during or up to 4 hours after the transfusion. More serious problems may include: Transfusion-associated circulatory overload (TACO), or too much fluid in the lungs. This may cause breathing problems. A serious allergic reaction, such as difficulty breathing or swelling around the face and lips. Transfusion-related acute lung injury  (TRALI), which causes breathing difficulty and low oxygen in the blood. This can occur within hours of the transfusion or several days later. Iron overload. This can happen after receiving many blood transfusions over a period of time. Infection or virus being transmitted. This is rare because donated blood is carefully tested before it is given. Hemolytic transfusion reaction. This is rare. It happens when your body's defense system (immune system)tries to attack the new blood cells. Symptoms may include fever, chills, nausea, low blood pressure, and low back or chest pain. Transfusion-associated graft-versus-host disease (TAGVHD). This is rare. It happens when donated cells attack your body's healthy tissues. What happens before the procedure? Medicines Ask your health care provider about: Changing or stopping your regular medicines. This is especially important if you are taking diabetes medicines or blood thinners. Taking medicines such as aspirin and ibuprofen. These medicines can thin your blood. Do not take these medicines unless your health care provider tells you to take them. Taking over-the-counter medicines, vitamins, herbs, and supplements. General instructions Follow instructions from your health care provider about eating and drinking restrictions. You will have a blood test to determine your blood type. This is necessary to know what kind of blood your body will accept and to match it to the donor blood. If you are going to have a planned surgery, you may be able to do an autologous blood donation. This may be done in case you need to have a transfusion. You will have your temperature, blood pressure, and pulse monitored before the transfusion. If you have had an allergic reaction to a transfusion in the past, you may be given medicine to help prevent   a reaction. This medicine may be given to you by mouth (orally) or through an IV. Set aside time for the blood transfusion. This  procedure generally takes 1-4 hours to complete. What happens during the procedure?  An IV will be inserted into one of your veins. The bag of donated blood will be attached to your IV. The blood will then enter through your vein. Your temperature, blood pressure, and pulse will be monitored regularly during the transfusion. This monitoring is done to detect early signs of a transfusion reaction. Tell your nurse right away if you have any of these symptoms during the transfusion: Shortness of breath or trouble breathing. Chest or back pain. Fever or chills. Hives or itching. If you have any signs or symptoms of a reaction, your transfusion will be stopped and you may be given medicine. When the transfusion is complete, your IV will be removed. Pressure may be applied to the IV site for a few minutes. A bandage (dressing)will be applied. The procedure may vary among health care providers and hospitals. What happens after the procedure? Your temperature, blood pressure, pulse, breathing rate, and blood oxygen level will be monitored until you leave the hospital or clinic. Your blood may be tested to see how you are responding to the transfusion. You may be warmed with fluids or blankets to maintain a normal body temperature. If you receive your blood transfusion in an outpatient setting, you will be told whom to contact to report any reactions. Where to find more information For more information on blood transfusions, visit the [employer]: redcross.org Summary A blood transfusion is a procedure in which you receive blood or a type of blood cell (blood component) through an IV. The blood you receive may come from a donor or be donated by yourself (autologous blood donation) before a planned surgery. The blood given in a transfusion is made up of different blood components. You may receive red blood cells, platelets, plasma, or white blood cells depending on the condition treated. Your  temperature, blood pressure, and pulse will be monitored before, during, and after the transfusion. After the transfusion, your blood may be tested to see how your body has responded. This information is not intended to replace advice given to you by your health care provider. Make sure you discuss any questions you have with your health care provider. Document Revised: 12/19/2018 Document Reviewed: 08/08/2018 Elsevier Patient Education  [place]. Platelet Transfusion A platelet transfusion is a procedure in which a person receives donated platelets through an IV. Platelets are parts of blood that stick together and form a clot to help the body stop bleeding after an injury. If you have too few platelets, your blood may have trouble clotting. This may cause you to bleed and bruise very easily. You may need a platelet transfusion if you have a condition that causes a low number of platelets (thrombocytopenia). A platelet transfusion may be used to stop or prevent excessive bleeding. Tell a health care provider about: Any reactions you have had during previous transfusions. Any allergies you have. All medicines you are taking, including vitamins, herbs, eye drops, creams, and over-the-counter medicines. Any bleeding problems you have. Any surgeries you have had. Any medical conditions you have. Whether you are pregnant or may be pregnant. What are the risks? Generally, this is a safe procedure. However, problems may occur, including: Fever. Infection. Allergic reaction to the donated (donor) platelets. Your body's disease-fighting system (immune system) attacking the donor  platelets (hemolytic reaction). This is rare. A rare reaction that causes lung damage (transfusion-related acute lung injury). What happens before the procedure? Medicines Ask your health care provider about: Changing or stopping your regular medicines. This is especially important if you are taking diabetes  medicines or blood thinners. Taking medicines such as aspirin and ibuprofen. These medicines can thin your blood. Do not take these medicines unless your health care provider tells you to take them. Taking over-the-counter medicines, vitamins, herbs, and supplements. General instructions You will have a blood test to determine your blood type. Your blood type determines what kind of platelets you will be given. Follow instructions from your health care provider about eating or drinking restrictions. If you have had an allergic reaction to a transfusion in the past, you may be given medicine to help prevent a reaction. Your temperature, blood pressure, pulse, and breathing will be monitored. What happens during the procedure?  An IV will be inserted into one of your veins. For your safety, two health care providers will verify your identity along with the donor platelets about to be infused. A bag of donor platelets will be connected to your IV. The platelets will flow into your bloodstream. This usually takes 30-60 minutes. Your temperature, blood pressure, pulse, and breathing will be monitored during the transfusion. This helps detect early signs of any reaction. You will also be monitored for other symptoms that may indicate a reaction, including chills, hives, or itching. If you have signs of a reaction at any time, your transfusion will be stopped, and you may be given medicine to help manage the reaction. When your transfusion is complete, your IV will be removed. Pressure may be applied to the IV site for a few minutes to stop any bleeding. The IV site will be covered with a bandage (dressing). The procedure may vary among health care providers and hospitals. What can I expect after the procedure? Your blood pressure, temperature, pulse, and breathing will be monitored until you leave the hospital or clinic. You may have some bruising and soreness at your IV site. Follow these  instructions at home: Medicines Take over-the-counter and prescription medicines only as told by your health care provider. Talk with your health care provider before you take any medicines that contain aspirin or NSAIDs, such as ibuprofen. These medicines increase your risk for dangerous bleeding. IV site care Check your IV site every day for signs of infection. Check for: Redness, swelling, or pain. Fluid or blood. If fluid or blood drains from your IV site, use your hands to press down firmly on a bandage covering the area for a minute or two. Doing this should stop the bleeding. Warmth. Pus or a bad smell. General instructions Change or remove your dressing as told by your health care provider. Return to your normal activities as told by your health care provider. Ask your health care provider what activities are safe for you. Do not take baths, swim, or use a hot tub until your health care provider approves. Ask your health care provider if you may take showers. Keep all follow-up visits. This is important. Contact a health care provider if: You have a headache that does not go away with medicine. You have hives, rash, or itchy skin. You have nausea or vomiting. You feel unusually tired or weak. You have signs of infection at your IV site. Get help right away if: You have a fever or chills. You urinate less often than usual. Your urine  is darker colored than normal. You have any of the following: Trouble breathing. Pain in your back, abdomen, or chest. Cool, clammy skin. A fast heartbeat. Summary Platelets are tiny pieces of blood cells that clump together to form a blood clot when you have an injury. If you have too few platelets, your blood may have trouble clotting. A platelet transfusion is a procedure in which you receive donated platelets through an IV. A platelet transfusion may be used to stop or prevent excessive bleeding. After the procedure, check your IV site every  day for signs of infection. This information is not intended to replace advice given to you by your health care provider. Make sure you discuss any questions you have with your health care provider. Document Revised: 08/19/2020 Document Reviewed: 08/19/2020 Elsevier Patient Education  [place].

## 2021-08-11 NOTE — Progress Notes (Signed)
Per Dr. Marin Olp, call in Lasix 40 mg po instead of giving Lasix 40 IV tonight. Patient verbalized understanding.

## 2021-08-11 NOTE — Progress Notes (Signed)
Patient refuses Tylenol and Benadryl pre blood product transfusion. Patient states she has not had a previous reaction.

## 2021-08-11 NOTE — Telephone Encounter (Signed)
Dr. Marin Olp notified of Platelets-9, WBC-0.1, ANC-0.0 and HGB-8.3.  Orders received for pt to get one unit of platelets and two units of blood today per Dr. Marin Olp.  Message sent to scheduling.

## 2021-08-12 ENCOUNTER — Encounter: Payer: Self-pay | Admitting: Hematology & Oncology

## 2021-08-12 LAB — TYPE AND SCREEN
ABO/RH(D): A POS
Antibody Screen: NEGATIVE
Unit division: 0
Unit division: 0

## 2021-08-12 LAB — BPAM RBC
Blood Product Expiration Date: 202307052359
Blood Product Expiration Date: 202307052359
ISSUE DATE / TIME: 202306151307
ISSUE DATE / TIME: 202306151307
Unit Type and Rh: 6200
Unit Type and Rh: 6200

## 2021-08-12 LAB — BPAM PLATELET PHERESIS
Blood Product Expiration Date: 202306152359
ISSUE DATE / TIME: 202306151012
Unit Type and Rh: 7300

## 2021-08-12 LAB — PREPARE PLATELET PHERESIS: Unit division: 0

## 2021-08-15 ENCOUNTER — Inpatient Hospital Stay: Payer: Medicare Other

## 2021-08-15 ENCOUNTER — Other Ambulatory Visit: Payer: Self-pay | Admitting: *Deleted

## 2021-08-15 ENCOUNTER — Other Ambulatory Visit: Payer: Self-pay | Admitting: Oncology

## 2021-08-15 ENCOUNTER — Telehealth: Payer: Self-pay | Admitting: *Deleted

## 2021-08-15 DIAGNOSIS — Z9221 Personal history of antineoplastic chemotherapy: Secondary | ICD-10-CM | POA: Diagnosis not present

## 2021-08-15 DIAGNOSIS — D500 Iron deficiency anemia secondary to blood loss (chronic): Secondary | ICD-10-CM

## 2021-08-15 DIAGNOSIS — C8332 Diffuse large B-cell lymphoma, intrathoracic lymph nodes: Secondary | ICD-10-CM

## 2021-08-15 DIAGNOSIS — C8330 Diffuse large B-cell lymphoma, unspecified site: Secondary | ICD-10-CM | POA: Diagnosis not present

## 2021-08-15 DIAGNOSIS — D696 Thrombocytopenia, unspecified: Secondary | ICD-10-CM

## 2021-08-15 DIAGNOSIS — D649 Anemia, unspecified: Secondary | ICD-10-CM

## 2021-08-15 DIAGNOSIS — Z79899 Other long term (current) drug therapy: Secondary | ICD-10-CM | POA: Diagnosis not present

## 2021-08-15 LAB — CMP (CANCER CENTER ONLY)
ALT: 43 U/L (ref 0–44)
AST: 18 U/L (ref 15–41)
Albumin: 4.2 g/dL (ref 3.5–5.0)
Alkaline Phosphatase: 106 U/L (ref 38–126)
Anion gap: 8 (ref 5–15)
BUN: 18 mg/dL (ref 8–23)
CO2: 30 mmol/L (ref 22–32)
Calcium: 10.1 mg/dL (ref 8.9–10.3)
Chloride: 100 mmol/L (ref 98–111)
Creatinine: 0.69 mg/dL (ref 0.44–1.00)
GFR, Estimated: >60 mL/min (ref >60)
Glucose, Bld: 117 mg/dL — ABNORMAL HIGH (ref 70–99)
Potassium: 4.1 mmol/L (ref 3.5–5.1)
Sodium: 138 mmol/L (ref 135–145)
Total Bilirubin: 0.7 mg/dL (ref 0.3–1.2)
Total Protein: 6.8 g/dL (ref 6.5–8.1)

## 2021-08-15 LAB — CBC WITH DIFFERENTIAL (CANCER CENTER ONLY)
Abs Immature Granulocytes: 0 10*3/uL (ref 0.00–0.07)
Basophils Absolute: 0 10*3/uL (ref 0.0–0.1)
Basophils Relative: 0 %
Eosinophils Absolute: 0 10*3/uL (ref 0.0–0.5)
Eosinophils Relative: 3 %
HCT: 27 % — ABNORMAL LOW (ref 36.0–46.0)
Hemoglobin: 9.1 g/dL — ABNORMAL LOW (ref 12.0–15.0)
Immature Granulocytes: 0 %
Lymphocytes Relative: 39 %
Lymphs Abs: 0.2 10*3/uL — ABNORMAL LOW (ref 0.7–4.0)
MCH: 29.9 pg (ref 26.0–34.0)
MCHC: 33.7 g/dL (ref 30.0–36.0)
MCV: 88.8 fL (ref 80.0–100.0)
Monocytes Absolute: 0.1 10*3/uL (ref 0.1–1.0)
Monocytes Relative: 29 %
Neutro Abs: 0.1 10*3/uL — CL (ref 1.7–7.7)
Neutrophils Relative %: 29 %
Platelet Count: <5 10*3/uL — CL (ref 150–400)
RBC: 3.04 MIL/uL — ABNORMAL LOW (ref 3.87–5.11)
RDW: 14.6 % (ref 11.5–15.5)
Smear Review: NORMAL
WBC Count: 0.4 10*3/uL — CL (ref 4.0–10.5)
nRBC: 0 % (ref 0.0–0.2)

## 2021-08-15 LAB — SAMPLE TO BLOOD BANK

## 2021-08-15 NOTE — Telephone Encounter (Signed)
Critical results of WBC .4, platelets less than 5,000, ANC critical.  Dr Marin Olp aware.  Platelets ordered for today

## 2021-08-15 NOTE — Patient Instructions (Signed)

## 2021-08-16 LAB — BPAM PLATELET PHERESIS
Blood Product Expiration Date: 202306202359
ISSUE DATE / TIME: 202306190953
Unit Type and Rh: 6200

## 2021-08-16 LAB — PREPARE PLATELET PHERESIS: Unit division: 0

## 2021-08-17 DIAGNOSIS — H2513 Age-related nuclear cataract, bilateral: Secondary | ICD-10-CM | POA: Diagnosis not present

## 2021-08-18 ENCOUNTER — Inpatient Hospital Stay: Payer: Medicare Other

## 2021-08-18 ENCOUNTER — Telehealth: Payer: Self-pay | Admitting: *Deleted

## 2021-08-18 ENCOUNTER — Other Ambulatory Visit: Payer: Self-pay | Admitting: *Deleted

## 2021-08-18 DIAGNOSIS — Z9221 Personal history of antineoplastic chemotherapy: Secondary | ICD-10-CM | POA: Diagnosis not present

## 2021-08-18 DIAGNOSIS — C8332 Diffuse large B-cell lymphoma, intrathoracic lymph nodes: Secondary | ICD-10-CM

## 2021-08-18 DIAGNOSIS — D500 Iron deficiency anemia secondary to blood loss (chronic): Secondary | ICD-10-CM

## 2021-08-18 DIAGNOSIS — D696 Thrombocytopenia, unspecified: Secondary | ICD-10-CM

## 2021-08-18 DIAGNOSIS — C8330 Diffuse large B-cell lymphoma, unspecified site: Secondary | ICD-10-CM | POA: Diagnosis not present

## 2021-08-18 DIAGNOSIS — Z79899 Other long term (current) drug therapy: Secondary | ICD-10-CM | POA: Diagnosis not present

## 2021-08-18 DIAGNOSIS — D649 Anemia, unspecified: Secondary | ICD-10-CM

## 2021-08-18 DIAGNOSIS — C8510 Unspecified B-cell lymphoma, unspecified site: Secondary | ICD-10-CM

## 2021-08-18 LAB — CBC WITH DIFFERENTIAL (CANCER CENTER ONLY)
Abs Immature Granulocytes: 0.7 10*3/uL — ABNORMAL HIGH (ref 0.00–0.07)
Band Neutrophils: 7 %
Basophils Absolute: 0 10*3/uL (ref 0.0–0.1)
Basophils Relative: 0 %
Eosinophils Absolute: 0 10*3/uL (ref 0.0–0.5)
Eosinophils Relative: 0 %
HCT: 25.7 % — ABNORMAL LOW (ref 36.0–46.0)
Hemoglobin: 8.6 g/dL — ABNORMAL LOW (ref 12.0–15.0)
Lymphocytes Relative: 5 %
Lymphs Abs: 0.3 10*3/uL — ABNORMAL LOW (ref 0.7–4.0)
MCH: 29.8 pg (ref 26.0–34.0)
MCHC: 33.5 g/dL (ref 30.0–36.0)
MCV: 88.9 fL (ref 80.0–100.0)
Metamyelocytes Relative: 8 %
Monocytes Absolute: 0.7 10*3/uL (ref 0.1–1.0)
Monocytes Relative: 10 %
Myelocytes: 2 %
Neutro Abs: 5 10*3/uL (ref 1.7–7.7)
Neutrophils Relative %: 68 %
Platelet Count: 5 10*3/uL — CL (ref 150–400)
RBC: 2.89 MIL/uL — ABNORMAL LOW (ref 3.87–5.11)
RDW: 14.7 % (ref 11.5–15.5)
Smear Review: NORMAL
WBC Count: 6.7 10*3/uL (ref 4.0–10.5)
nRBC: 0 % (ref 0.0–0.2)

## 2021-08-18 LAB — CMP (CANCER CENTER ONLY)
ALT: 31 U/L (ref 0–44)
AST: 19 U/L (ref 15–41)
Albumin: 4.2 g/dL (ref 3.5–5.0)
Alkaline Phosphatase: 116 U/L (ref 38–126)
Anion gap: 7 (ref 5–15)
BUN: 15 mg/dL (ref 8–23)
CO2: 30 mmol/L (ref 22–32)
Calcium: 10 mg/dL (ref 8.9–10.3)
Chloride: 100 mmol/L (ref 98–111)
Creatinine: 0.73 mg/dL (ref 0.44–1.00)
GFR, Estimated: >60 mL/min (ref >60)
Glucose, Bld: 117 mg/dL — ABNORMAL HIGH (ref 70–99)
Potassium: 4.1 mmol/L (ref 3.5–5.1)
Sodium: 137 mmol/L (ref 135–145)
Total Bilirubin: 0.4 mg/dL (ref 0.3–1.2)
Total Protein: 6.6 g/dL (ref 6.5–8.1)

## 2021-08-18 LAB — SAMPLE TO BLOOD BANK

## 2021-08-18 MED ORDER — SODIUM CHLORIDE 0.9% FLUSH
10.0000 mL | INTRAVENOUS | Status: AC | PRN
  Administered 2021-08-18: 10 mL

## 2021-08-18 MED ORDER — SODIUM CHLORIDE 0.9% IV SOLUTION
250.0000 mL | Freq: Once | INTRAVENOUS | Status: AC
  Administered 2021-08-18: 250 mL via INTRAVENOUS

## 2021-08-18 MED ORDER — DIPHENHYDRAMINE HCL 25 MG PO CAPS: 25.0000 mg | ORAL_CAPSULE | Freq: Once | ORAL | Status: DC

## 2021-08-18 MED ORDER — HEPARIN SOD (PORK) LOCK FLUSH 100 UNIT/ML IV SOLN
500.0000 [IU] | Freq: Every day | INTRAVENOUS | Status: AC | PRN
  Administered 2021-08-18: 500 [IU]

## 2021-08-18 MED ORDER — ACETAMINOPHEN 325 MG PO TABS: 650.0000 mg | ORAL_TABLET | Freq: Once | ORAL | Status: DC

## 2021-08-18 NOTE — Patient Instructions (Signed)

## 2021-08-18 NOTE — Progress Notes (Signed)
Pt states she has not had any reaction to any blood products and refused tylenol and benadryl prior to blood transfusion.

## 2021-08-18 NOTE — Telephone Encounter (Signed)
Dr. Marin Olp notified of platelet count-5 and HGB-8.6.  Order received for pt to receive one unit of platelets and one unit of PRBC's today per Dr. Marin Olp. Message sent to scheduling.

## 2021-08-18 NOTE — Patient Instructions (Signed)
Blood Transfusion, Adult, Care After ?This sheet gives you information about how to care for yourself after your procedure. Your health care provider may also give you more specific instructions. If you have problems or questions, contact your health care provider. ?What can I expect after the procedure? ?After the procedure, it is common to have: ?Bruising and soreness where the IV was inserted. ?A headache. ?Follow these instructions at home: ?IV insertion site care ? ?  ? ?Follow instructions from your health care provider about how to take care of your IV insertion site. Make sure you: ?Wash your hands with soap and water before and after you change your bandage (dressing). If soap and water are not available, use hand sanitizer. ?Change your dressing as told by your health care provider. ?Check your IV insertion site every day for signs of infection. Check for: ?Redness, swelling, or pain. ?Bleeding from the site. ?Warmth. ?Pus or a bad smell. ?General instructions ?Take over-the-counter and prescription medicines only as told by your health care provider. ?Rest as told by your health care provider. ?Return to your normal activities as told by your health care provider. ?Keep all follow-up visits as told by your health care provider. This is important. ?Contact a health care provider if: ?You have itching or red, swollen areas of skin (hives). ?You feel anxious. ?You feel weak after doing your normal activities. ?You have redness, swelling, warmth, or pain around the IV insertion site. ?You have blood coming from the IV insertion site that does not stop with pressure. ?You have pus or a bad smell coming from your IV insertion site. ?Get help right away if: ?You have symptoms of a serious allergic or immune system reaction, including: ?Trouble breathing or shortness of breath. ?Swelling of the face or feeling flushed. ?Fever or chills. ?Pain in the head, back, or chest. ?Dark urine or blood in the  urine. ?Widespread rash. ?Fast heartbeat. ?Feeling dizzy or light-headed. ?If you receive your blood transfusion in an outpatient setting, you will be told whom to contact to report any reactions. ?These symptoms may represent a serious problem that is an emergency. Do not wait to see if the symptoms will go away. Get medical help right away. Call your local emergency services (911 in the [place] Do not drive yourself to the hospital. ?Summary ?Bruising and tenderness around the IV insertion site are common. ?Check your IV insertion site every day for signs of infection. ?Rest as told by your health care provider. Return to your normal activities as told by your health care provider. ?Get help right away for symptoms of a serious allergic or immune system reaction to blood transfusion. ?This information is not intended to replace advice given to you by your health care provider. Make sure you discuss any questions you have with your health care provider. ?Document Revised: 06/10/2020 Document Reviewed: 08/08/2018 ?Elsevier Patient Education ? 2023 Elsevier Inc. ? ?

## 2021-08-19 ENCOUNTER — Encounter: Payer: Self-pay | Admitting: Family

## 2021-08-19 ENCOUNTER — Encounter: Payer: Self-pay | Admitting: Neurology

## 2021-08-19 DIAGNOSIS — M87052 Idiopathic aseptic necrosis of left femur: Secondary | ICD-10-CM | POA: Diagnosis not present

## 2021-08-19 LAB — TYPE AND SCREEN
ABO/RH(D): A POS
Antibody Screen: NEGATIVE
Unit division: 0

## 2021-08-19 LAB — BPAM RBC
Blood Product Expiration Date: 202307132359
ISSUE DATE / TIME: 202306221142
Unit Type and Rh: 6200

## 2021-08-19 LAB — PREPARE PLATELET PHERESIS: Unit division: 0

## 2021-08-19 LAB — BPAM PLATELET PHERESIS
Blood Product Expiration Date: 202306232359
ISSUE DATE / TIME: 202306220957
Unit Type and Rh: 6200

## 2021-08-22 ENCOUNTER — Inpatient Hospital Stay: Payer: Medicare Other

## 2021-08-22 VITALS — BP 122/63 | HR 71 | Temp 98.1°F | Resp 17

## 2021-08-22 DIAGNOSIS — C8510 Unspecified B-cell lymphoma, unspecified site: Secondary | ICD-10-CM

## 2021-08-22 DIAGNOSIS — Z79899 Other long term (current) drug therapy: Secondary | ICD-10-CM | POA: Diagnosis not present

## 2021-08-22 DIAGNOSIS — D696 Thrombocytopenia, unspecified: Secondary | ICD-10-CM

## 2021-08-22 DIAGNOSIS — Z9221 Personal history of antineoplastic chemotherapy: Secondary | ICD-10-CM | POA: Diagnosis not present

## 2021-08-22 DIAGNOSIS — D649 Anemia, unspecified: Secondary | ICD-10-CM

## 2021-08-22 DIAGNOSIS — C8332 Diffuse large B-cell lymphoma, intrathoracic lymph nodes: Secondary | ICD-10-CM

## 2021-08-22 DIAGNOSIS — D500 Iron deficiency anemia secondary to blood loss (chronic): Secondary | ICD-10-CM

## 2021-08-22 DIAGNOSIS — C8330 Diffuse large B-cell lymphoma, unspecified site: Secondary | ICD-10-CM | POA: Diagnosis not present

## 2021-08-22 LAB — CBC WITH DIFFERENTIAL (CANCER CENTER ONLY)
Abs Immature Granulocytes: 0.15 10*3/uL — ABNORMAL HIGH (ref 0.00–0.07)
Basophils Absolute: 0 10*3/uL (ref 0.0–0.1)
Basophils Relative: 1 %
Eosinophils Absolute: 0 10*3/uL (ref 0.0–0.5)
Eosinophils Relative: 0 %
HCT: 28.5 % — ABNORMAL LOW (ref 36.0–46.0)
Hemoglobin: 9.4 g/dL — ABNORMAL LOW (ref 12.0–15.0)
Immature Granulocytes: 4 %
Lymphocytes Relative: 7 %
Lymphs Abs: 0.3 10*3/uL — ABNORMAL LOW (ref 0.7–4.0)
MCH: 29.3 pg (ref 26.0–34.0)
MCHC: 33 g/dL (ref 30.0–36.0)
MCV: 88.8 fL (ref 80.0–100.0)
Monocytes Absolute: 1 10*3/uL (ref 0.1–1.0)
Monocytes Relative: 29 %
Neutro Abs: 2.1 10*3/uL (ref 1.7–7.7)
Neutrophils Relative %: 59 %
Platelet Count: 25 10*3/uL — ABNORMAL LOW (ref 150–400)
RBC: 3.21 MIL/uL — ABNORMAL LOW (ref 3.87–5.11)
RDW: 14.4 % (ref 11.5–15.5)
Smear Review: NORMAL
WBC Count: 3.6 10*3/uL — ABNORMAL LOW (ref 4.0–10.5)
nRBC: 0 % (ref 0.0–0.2)

## 2021-08-22 LAB — CMP (CANCER CENTER ONLY)
ALT: 28 U/L (ref 0–44)
AST: 22 U/L (ref 15–41)
Albumin: 4.2 g/dL (ref 3.5–5.0)
Alkaline Phosphatase: 104 U/L (ref 38–126)
Anion gap: 5 (ref 5–15)
BUN: 16 mg/dL (ref 8–23)
CO2: 32 mmol/L (ref 22–32)
Calcium: 10 mg/dL (ref 8.9–10.3)
Chloride: 99 mmol/L (ref 98–111)
Creatinine: 0.65 mg/dL (ref 0.44–1.00)
GFR, Estimated: >60 mL/min (ref >60)
Glucose, Bld: 111 mg/dL — ABNORMAL HIGH (ref 70–99)
Potassium: 4.4 mmol/L (ref 3.5–5.1)
Sodium: 136 mmol/L (ref 135–145)
Total Bilirubin: 0.3 mg/dL (ref 0.3–1.2)
Total Protein: 6.5 g/dL (ref 6.5–8.1)

## 2021-08-22 LAB — SAMPLE TO BLOOD BANK

## 2021-08-22 MED ORDER — HEPARIN SOD (PORK) LOCK FLUSH 100 UNIT/ML IV SOLN
500.0000 [IU] | Freq: Once | INTRAVENOUS | Status: AC
  Administered 2021-08-22: 500 [IU] via INTRAVENOUS

## 2021-08-22 MED ORDER — SODIUM CHLORIDE 0.9% FLUSH
10.0000 mL | Freq: Once | INTRAVENOUS | Status: AC
  Administered 2021-08-22: 10 mL via INTRAVENOUS

## 2021-08-23 NOTE — Progress Notes (Signed)
Assessment/Plan:    Large B cell non-Hodgkin's lymphoma  -Patient's previous event that we thought was TIA, was likely related to cerebral lesions from the non-Hodgkin's lymphoma.  She is no longer on antiplatelet medication.  MRI brain done Jun 29, 2021 demonstrated no residual enhancing lesions.  There is evidence of encephalomalacia in the left posterior temporal lobe.  She has no clinical evidence of seizure.  Right now, we both decided that she needs no further work-up.  Her neurological examination is nonfocal and nonlateralizing.  She understands that with anyone who has an abnormal brain, there is an increased risk for seizure in the future.  From a neurologic standpoint, I have no objection to her undergoing hip surgery.  Forms filled out for ortho.  Per records she is going to only have spinal anesthesia   Subjective:   Ariel [name] was seen today in follow up.  Ariel [name] is getting ready to undergo hip replacement and a form came for surgical clearance/optimization from a neuro standpoint.  I have not seen the patient in almost a year, so the patient presented today.  When we last saw the patient, in August, 2022, the patient had what we initially thought was probably a TIA.  She presented with some word finding difficulties and nonsensical speech that quickly resolved in the emergency room after being given aspirin.  MRI brain was negative.  However, not long thereafter, the patient was diagnosed with diffuse large B cell non-Hodgkin's lymphoma.  MRI brain with and without gadolinium then demonstrated 2 enhancing lesions in the left cerebral hemisphere.  Patient has since had chemotherapy.  Patient is well enough now that she would like to undergo left hip replacement in august.     CURRENT MEDICATIONS:  Outpatient Encounter Medications as of 08/26/2021  Medication Sig   acyclovir (ZOVIRAX) 200 MG capsule TAKE 1 CAPSULE TWICE A DAY. (Patient taking differently: Take 200 mg by mouth 2 (two)  times daily.)   OVER THE COUNTER MEDICATION Take 3 capsules by mouth See admin instructions. Balance of Nature VEGETABLE formulation capsules- Take 3 capsules by mouth every morning   OVER THE COUNTER MEDICATION Take 3 capsules by mouth See admin instructions. Balance of Nature FRUIT formulation capsules- Take 3 capsules by mouth every morning   antiseptic oral rinse (BIOTENE) LIQD 15 mLs by Mouth Rinse route every 4 (four) hours. (Patient not taking: Reported on 08/26/2021)   ciprofloxacin (CIPRO) 500 MG tablet Take 1 tablet (500 mg total) by mouth daily with breakfast. (Patient not taking: Reported on 08/26/2021)   docusate sodium (COLACE) 100 MG capsule Take 1 capsule (100 mg total) by mouth 2 (two) times daily as needed for mild constipation. (Patient not taking: Reported on 08/26/2021)   ondansetron (ZOFRAN) 8 MG tablet Take 1 tablet (8 mg total) by mouth every 8 (eight) hours as needed for nausea or vomiting. (Patient not taking: Reported on 08/26/2021)   traMADol (ULTRAM) 50 MG tablet Take one (50 mg) to two (100 mg) tablets every six hours as needed for pain (Patient not taking: Reported on 08/01/2021)   VUITY 1.25 % SOLN Place 1 drop into both eyes in the morning and at bedtime. (Patient not taking: Reported on 08/01/2021)   No facility-administered encounter medications on file as of 08/26/2021.     Objective:   PHYSICAL EXAMINATION:    VITALS:   Vitals:   08/26/21 0755  BP: 130/60  Pulse: 76  SpO2: 97%  Weight: 154 lb (69.9 kg)  Height:  $'5\' 6"'O$  (1.676 m)    GEN:  Normal appears female in no acute distress.  Appears stated age. HEENT:  Normocephalic, atraumatic. The mucous membranes are moist. The superficial temporal arteries are without ropiness or tenderness. Cardiovascular: Regular rate and rhythm. Lungs: Clear to auscultation bilaterally. Neck/Heme: There are no carotid bruits noted bilaterally.   NEUROLOGICAL: Orientation:  The patient is alert and oriented x 3.   Cranial  nerves: There is good facial symmetry.  Extraocular muscles are intact and visual fields are full to confrontational testing. Speech is fluent and clear.  Tone: Tone is good throughout. Sensation: Sensation is intact to light touch throughout (facial, trunk, extremities).  Coordination:  The patient has no difficulty with RAM's or FNF bilaterally. Motor: Strength is 5/5 in the bilateral upper and lower extremities.  Gait and Station: The patient is slow and tenous - ambulates with cane.     Cc:  [name], NP

## 2021-08-26 ENCOUNTER — Ambulatory Visit (INDEPENDENT_AMBULATORY_CARE_PROVIDER_SITE_OTHER): Payer: Medicare Other | Admitting: Neurology

## 2021-08-26 ENCOUNTER — Encounter: Payer: Self-pay | Admitting: Neurology

## 2021-08-26 ENCOUNTER — Other Ambulatory Visit: Payer: Self-pay | Admitting: Orthopaedic Surgery

## 2021-08-26 VITALS — BP 130/60 | HR 76 | Ht 66.0 in | Wt 154.0 lb

## 2021-08-26 DIAGNOSIS — C7931 Secondary malignant neoplasm of brain: Secondary | ICD-10-CM

## 2021-08-26 NOTE — Patient Instructions (Signed)
You look great!  I have no objection to your surgery.  The physicians and staff at [facility] are committed to providing excellent care. You may receive a survey requesting feedback about your experience at our office. We strive to receive "very good" responses to the survey questions. If you feel that your experience would prevent you from giving the office a "very good " response, please contact our office to try to remedy the situation. We may be reached at [phone]. Thank you for taking the time out of your busy day to complete the survey.

## 2021-08-29 ENCOUNTER — Inpatient Hospital Stay: Payer: Medicare Other | Attending: Internal Medicine

## 2021-08-29 DIAGNOSIS — C8510 Unspecified B-cell lymphoma, unspecified site: Secondary | ICD-10-CM

## 2021-08-29 DIAGNOSIS — C8330 Diffuse large B-cell lymphoma, unspecified site: Secondary | ICD-10-CM | POA: Insufficient documentation

## 2021-08-29 DIAGNOSIS — Z9221 Personal history of antineoplastic chemotherapy: Secondary | ICD-10-CM | POA: Diagnosis not present

## 2021-08-29 DIAGNOSIS — D509 Iron deficiency anemia, unspecified: Secondary | ICD-10-CM | POA: Diagnosis not present

## 2021-08-29 LAB — CBC WITH DIFFERENTIAL (CANCER CENTER ONLY)
Abs Immature Granulocytes: 0.01 10*3/uL (ref 0.00–0.07)
Basophils Absolute: 0 10*3/uL (ref 0.0–0.1)
Basophils Relative: 1 %
Eosinophils Absolute: 0 10*3/uL (ref 0.0–0.5)
Eosinophils Relative: 0 %
HCT: 27.5 % — ABNORMAL LOW (ref 36.0–46.0)
Hemoglobin: 9.2 g/dL — ABNORMAL LOW (ref 12.0–15.0)
Immature Granulocytes: 0 %
Lymphocytes Relative: 42 %
Lymphs Abs: 1.3 10*3/uL (ref 0.7–4.0)
MCH: 30.9 pg (ref 26.0–34.0)
MCHC: 33.5 g/dL (ref 30.0–36.0)
MCV: 92.3 fL (ref 80.0–100.0)
Monocytes Absolute: 0.6 10*3/uL (ref 0.1–1.0)
Monocytes Relative: 20 %
Neutro Abs: 1.1 10*3/uL — ABNORMAL LOW (ref 1.7–7.7)
Neutrophils Relative %: 37 %
Platelet Count: 65 10*3/uL — ABNORMAL LOW (ref 150–400)
RBC: 2.98 MIL/uL — ABNORMAL LOW (ref 3.87–5.11)
RDW: 16.8 % — ABNORMAL HIGH (ref 11.5–15.5)
Smear Review: NORMAL
WBC Count: 3.1 10*3/uL — ABNORMAL LOW (ref 4.0–10.5)
nRBC: 0 % (ref 0.0–0.2)

## 2021-08-29 LAB — CMP (CANCER CENTER ONLY)
ALT: 37 U/L (ref 0–44)
AST: 32 U/L (ref 15–41)
Albumin: 4.4 g/dL (ref 3.5–5.0)
Alkaline Phosphatase: 97 U/L (ref 38–126)
Anion gap: 6 (ref 5–15)
BUN: 11 mg/dL (ref 8–23)
CO2: 31 mmol/L (ref 22–32)
Calcium: 9.8 mg/dL (ref 8.9–10.3)
Chloride: 102 mmol/L (ref 98–111)
Creatinine: 0.76 mg/dL (ref 0.44–1.00)
GFR, Estimated: >60 mL/min (ref >60)
Glucose, Bld: 106 mg/dL — ABNORMAL HIGH (ref 70–99)
Potassium: 4.1 mmol/L (ref 3.5–5.1)
Sodium: 139 mmol/L (ref 135–145)
Total Bilirubin: 0.4 mg/dL (ref 0.3–1.2)
Total Protein: 6.4 g/dL — ABNORMAL LOW (ref 6.5–8.1)

## 2021-09-05 ENCOUNTER — Inpatient Hospital Stay: Payer: Medicare Other

## 2021-09-05 ENCOUNTER — Encounter: Payer: Self-pay | Admitting: Hematology & Oncology

## 2021-09-05 ENCOUNTER — Inpatient Hospital Stay (HOSPITAL_BASED_OUTPATIENT_CLINIC_OR_DEPARTMENT_OTHER): Payer: Medicare Other | Admitting: Hematology & Oncology

## 2021-09-05 ENCOUNTER — Other Ambulatory Visit: Payer: Self-pay

## 2021-09-05 ENCOUNTER — Other Ambulatory Visit: Payer: Self-pay | Admitting: *Deleted

## 2021-09-05 VITALS — BP 132/56 | HR 70 | Temp 98.4°F

## 2021-09-05 VITALS — BP 132/56 | HR 72 | Temp 98.4°F | Resp 18 | Wt 154.0 lb

## 2021-09-05 DIAGNOSIS — Z9221 Personal history of antineoplastic chemotherapy: Secondary | ICD-10-CM | POA: Diagnosis not present

## 2021-09-05 DIAGNOSIS — D649 Anemia, unspecified: Secondary | ICD-10-CM

## 2021-09-05 DIAGNOSIS — C8510 Unspecified B-cell lymphoma, unspecified site: Secondary | ICD-10-CM

## 2021-09-05 DIAGNOSIS — Z95828 Presence of other vascular implants and grafts: Secondary | ICD-10-CM

## 2021-09-05 DIAGNOSIS — D509 Iron deficiency anemia, unspecified: Secondary | ICD-10-CM | POA: Diagnosis not present

## 2021-09-05 DIAGNOSIS — C8330 Diffuse large B-cell lymphoma, unspecified site: Secondary | ICD-10-CM | POA: Diagnosis not present

## 2021-09-05 LAB — CMP (CANCER CENTER ONLY)
ALT: 33 U/L (ref 0–44)
AST: 36 U/L (ref 15–41)
Albumin: 3.8 g/dL (ref 3.5–5.0)
Alkaline Phosphatase: 72 U/L (ref 38–126)
Anion gap: 6 (ref 5–15)
BUN: 13 mg/dL (ref 8–23)
CO2: 28 mmol/L (ref 22–32)
Calcium: 9.4 mg/dL (ref 8.9–10.3)
Chloride: 102 mmol/L (ref 98–111)
Creatinine: 0.69 mg/dL (ref 0.44–1.00)
GFR, Estimated: >60 mL/min (ref >60)
Glucose, Bld: 113 mg/dL — ABNORMAL HIGH (ref 70–99)
Potassium: 4.2 mmol/L (ref 3.5–5.1)
Sodium: 136 mmol/L (ref 135–145)
Total Bilirubin: 0.3 mg/dL (ref 0.3–1.2)
Total Protein: 6.3 g/dL — ABNORMAL LOW (ref 6.5–8.1)

## 2021-09-05 LAB — CBC WITH DIFFERENTIAL (CANCER CENTER ONLY)
Abs Immature Granulocytes: 0.03 10*3/uL (ref 0.00–0.07)
Basophils Absolute: 0 10*3/uL (ref 0.0–0.1)
Basophils Relative: 1 %
Eosinophils Absolute: 0 10*3/uL (ref 0.0–0.5)
Eosinophils Relative: 0 %
HCT: 28.5 % — ABNORMAL LOW (ref 36.0–46.0)
Hemoglobin: 9.5 g/dL — ABNORMAL LOW (ref 12.0–15.0)
Immature Granulocytes: 1 %
Lymphocytes Relative: 49 %
Lymphs Abs: 1.8 10*3/uL (ref 0.7–4.0)
MCH: 31.9 pg (ref 26.0–34.0)
MCHC: 33.3 g/dL (ref 30.0–36.0)
MCV: 95.6 fL (ref 80.0–100.0)
Monocytes Absolute: 0.6 10*3/uL (ref 0.1–1.0)
Monocytes Relative: 16 %
Neutro Abs: 1.2 10*3/uL — ABNORMAL LOW (ref 1.7–7.7)
Neutrophils Relative %: 33 %
Platelet Count: 104 10*3/uL — ABNORMAL LOW (ref 150–400)
RBC: 2.98 MIL/uL — ABNORMAL LOW (ref 3.87–5.11)
RDW: 22.4 % — ABNORMAL HIGH (ref 11.5–15.5)
Smear Review: NORMAL
WBC Count: 3.6 10*3/uL — ABNORMAL LOW (ref 4.0–10.5)
nRBC: 0 % (ref 0.0–0.2)

## 2021-09-05 LAB — SAMPLE TO BLOOD BANK

## 2021-09-05 MED ORDER — SODIUM CHLORIDE 0.9% FLUSH
10.0000 mL | Freq: Once | INTRAVENOUS | Status: AC
  Administered 2021-09-05: 10 mL via INTRAVENOUS

## 2021-09-05 MED ORDER — HEPARIN SOD (PORK) LOCK FLUSH 100 UNIT/ML IV SOLN
500.0000 [IU] | Freq: Once | INTRAVENOUS | Status: AC
  Administered 2021-09-05: 500 [IU] via INTRAVENOUS

## 2021-09-05 NOTE — Progress Notes (Signed)
Hematology and Oncology Follow Up Visit  Ariel [name] [id number] 08-17-1955 66 y.o. 09/05/2021   Principle Diagnosis:  Diffuse large cell non-Hodgkin's lymphoma-CNS involvement  Current Therapy:   Status post chemotherapy with R-ICE/R-MTV --  s/p cycle #4 MATRIX -- s/p cycle #4 -- start on 05/02/2021 --completed on 08/06/2021 Neulasta 6 mg subcu post chemotherapy     Interim History:  Ariel [name] is comes in for follow-up.  She looks absolutely fantastic.  I am so happy for her.  She really got through chemotherapy much better than I would have thought.  We actually gave her an initial hybrid regimen.  We then gave her MATRIX.  She did this so that we can get better CNS penetration.  She completed all of her chemotherapy in June.  Her [place]  She was able to come out to see her daughter and grandson.  She really enjoyed this.   She is not can have a surgery for the left hip.  She has avascular necrosis of the left hip.  I will see no problems with her having this.  This will be on August 15.  She has had no change in bowel or bladder habits.  There has been no bleeding.  She has had no rashes.  There has been no mouth sores.  She has had no cough or shortness of breath.  Overall, I would say performance status is probably ECOG 1.    Medications:  Current Outpatient Medications:    acyclovir (ZOVIRAX) 200 MG capsule, TAKE 1 CAPSULE TWICE A DAY. (Patient taking differently: Take 200 mg by mouth 2 (two) times daily.), Disp: 60 capsule, Rfl: 2   antiseptic oral rinse (BIOTENE) LIQD, 15 mLs by Mouth Rinse route every 4 (four) hours. (Patient not taking: Reported on 08/26/2021), Disp: 500 mL, Rfl: 4   ciprofloxacin (CIPRO) 500 MG tablet, Take 1 tablet (500 mg total) by mouth daily with breakfast. (Patient not taking: Reported on 08/26/2021), Disp: 15 tablet, Rfl: 0   docusate sodium (COLACE) 100 MG capsule, Take 1 capsule (100 mg total) by  mouth 2 (two) times daily as needed for mild constipation. (Patient not taking: Reported on 08/26/2021), Disp: 10 capsule, Rfl: 0   ondansetron (ZOFRAN) 8 MG tablet, Take 1 tablet (8 mg total) by mouth every 8 (eight) hours as needed for nausea or vomiting. (Patient not taking: Reported on 08/26/2021), Disp: 30 tablet, Rfl: 3   OVER THE COUNTER MEDICATION, Take 3 capsules by mouth See admin instructions. Balance of Nature VEGETABLE formulation capsules- Take 3 capsules by mouth every morning, Disp: , Rfl:    OVER THE COUNTER MEDICATION, Take 3 capsules by mouth See admin instructions. Balance of Nature FRUIT formulation capsules- Take 3 capsules by mouth every morning, Disp: , Rfl:    traMADol (ULTRAM) 50 MG tablet, Take one (50 mg) to two (100 mg) tablets every six hours as needed for pain (Patient not taking: Reported on 08/01/2021), Disp: 60 tablet, Rfl: 0   VUITY 1.25 % SOLN, Place 1 drop into both eyes in the morning and at bedtime. (Patient not taking: Reported on 08/01/2021), Disp: , Rfl:   Allergies:  Allergies  Allergen Reactions   Decadron [Dexamethasone] Other (See Comments)    Made the patient's legs tired and she could not walk. Hallucinations   Plavix [Clopidogrel] Hives, Swelling and Other (See Comments)    Lips became swollen    Prednisone Other (See Comments)  Steroids caused psychological issues  Hallucinations   Codeine Nausea And Vomiting    Past Medical History, Surgical history, Social history, and Family History were reviewed and updated.  Review of Systems: Review of Systems  Constitutional: Negative.   HENT:  Negative.    Eyes: Negative.   Respiratory: Negative.    Cardiovascular: Negative.   Gastrointestinal: Negative.   Endocrine: Negative.   Genitourinary: Negative.    Musculoskeletal: Negative.   Skin: Negative.   Neurological: Negative.   Hematological: Negative.   Psychiatric/Behavioral: Negative.      Physical Exam:  weight is 154 lb (69.9 kg). Her  oral temperature is 98.4 F (36.9 C). Her blood pressure is 132/56 (abnormal) and her pulse is 72. Her respiration is 18 and oxygen saturation is 95%.   Wt Readings from Last 3 Encounters:  09/05/21 154 lb (69.9 kg)  08/26/21 154 lb (69.9 kg)  08/08/21 155 lb 9.6 oz (70.6 kg)    Physical Exam Vitals reviewed.  HENT:     Head: Normocephalic and atraumatic.     Mouth/Throat:     Comments: Oral exam shows multiple sites of gingival bleeding.  She has gauze in her mouth to try to help with the bleeding.  There is no obvious gingival breakdown. Eyes:     Pupils: Pupils are equal, round, and reactive to light.  Cardiovascular:     Rate and Rhythm: Normal rate and regular rhythm.     Heart sounds: Normal heart sounds.     Comments: Cardiac exam is regular rate and rhythm with no murmurs, rubs or bruits. Pulmonary:     Effort: Pulmonary effort is normal.     Breath sounds: Normal breath sounds.  Abdominal:     General: Bowel sounds are normal.     Palpations: Abdomen is soft.  Musculoskeletal:        General: No tenderness or deformity. Normal range of motion.     Cervical back: Normal range of motion.     Comments: Examination of her left hip shows little bit of tenderness to palpation over the anterior portion of the left hip.  She seems to have decent range of motion.  She has no swelling.  Lymphadenopathy:     Cervical: No cervical adenopathy.  Skin:    General: Skin is warm and dry.     Findings: No erythema or rash.     Comments: Skin exam does show petechia on her lower legs.  Neurological:     Mental Status: She is alert and oriented to person, place, and time.     Comments: Neurological exam shows no focal neurological deficits.  All cranial nerves are intact.  She has good strength in upper and lower extremities.  Psychiatric:        Behavior: Behavior normal.        Thought Content: Thought content normal.        Judgment: Judgment normal.      Lab Results   Component Value Date   WBC 3.6 (L) 09/05/2021   HGB 9.5 (L) 09/05/2021   HCT 28.5 (L) 09/05/2021   MCV 95.6 09/05/2021   PLT 104 (L) 09/05/2021     Chemistry      Component Value Date/Time   NA 139 08/29/2021 1030   K 4.1 08/29/2021 1030   CL 102 08/29/2021 1030   CO2 31 08/29/2021 1030   BUN 11 08/29/2021 1030   CREATININE 0.76 08/29/2021 1030   CREATININE 0.68 11/18/2012 1439  Component Value Date/Time   CALCIUM 9.8 08/29/2021 1030   ALKPHOS 97 08/29/2021 1030   AST 32 08/29/2021 1030   ALT 37 08/29/2021 1030   BILITOT 0.4 08/29/2021 1030      Impression and Plan: Ariel [name] is a very charming 66 year old white female.  She initially came to [place] with iron deficiency anemia.  We subsequently found that she had diffuse large cell non-Hodgkin's lymphoma.  It had traveled to her brain.  She has had a total of 8 cycles of treatment.  She had 4 cycles of a combination protocol.  We then gave her 4 cycles of MATRIX.  Have to believe that she is going to be in remission.  We will now set her up with an MRI of the brain and a PET scan.  I think we need both of these in order to assess for response.  I do not see a problem with her having surgery for the left hip.  She desperately needs to have this done.  She has a bad case of avascular necrosis.  She has a lot of pain when she walks because of bone on bone.  I would like to get the PET scan and MRI in about 3 weeks.  I want to see her back about a week or so before she has her surgery to make sure everything is doing okay and make sure her blood counts are fine.  Right now, we will go ahead and check her blood counts twice a week.  We will have her come back Thursday and then Monday and Thursday next week.   Volanda Napoleon, MD 7/10/20233:21 PM

## 2021-09-07 ENCOUNTER — Encounter: Payer: Self-pay | Admitting: *Deleted

## 2021-09-07 NOTE — Progress Notes (Signed)
Patient will need a PET and MRI in about 3 weeks prior to surgery. Both scheduled for 09/28/2021.   Called and spoke with patient. She is aware of date, time and location. Reviewed PET prep with patient. Radiology info sheet also mailed to patient home for education reinforcement.   Oncology Nurse Navigator Documentation     09/07/2021    2:30 PM  Oncology Nurse Navigator Flowsheets  Navigator Follow Up Date: 09/28/2021  Navigator Follow Up Reason: Scan Review  Navigator Location [facility]  Navigator Encounter Type Appt/Treatment Plan Review;Telephone  Telephone Outgoing Call;Appt Confirmation/Clarification;Education  Patient Visit Type MedOnc  Treatment Phase Active Tx  Barriers/Navigation Needs Coordination of Care;Education  Education Other  Interventions Coordination of Care;Education;Psycho-Social Support  Acuity Level 1-No Barriers  Coordination of Care Radiology  Education Method Verbal;Written  Support Groups/Services Friends and Family  Time Spent with Patient 30

## 2021-09-09 DIAGNOSIS — R262 Difficulty in walking, not elsewhere classified: Secondary | ICD-10-CM | POA: Diagnosis not present

## 2021-09-09 DIAGNOSIS — M1612 Unilateral primary osteoarthritis, left hip: Secondary | ICD-10-CM | POA: Diagnosis not present

## 2021-09-09 DIAGNOSIS — M25652 Stiffness of left hip, not elsewhere classified: Secondary | ICD-10-CM | POA: Diagnosis not present

## 2021-09-13 ENCOUNTER — Ambulatory Visit (HOSPITAL_BASED_OUTPATIENT_CLINIC_OR_DEPARTMENT_OTHER)
Admission: RE | Admit: 2021-09-13 | Discharge: 2021-09-13 | Disposition: A | Discharge status: Home / Self Care | Payer: Medicare Other | Source: Ambulatory Visit | Attending: Family | Admitting: Family

## 2021-09-13 ENCOUNTER — Ambulatory Visit (INDEPENDENT_AMBULATORY_CARE_PROVIDER_SITE_OTHER): Payer: Medicare Other | Admitting: Family

## 2021-09-13 ENCOUNTER — Other Ambulatory Visit: Payer: Self-pay | Admitting: Pharmacist

## 2021-09-13 VITALS — BP 127/57 | HR 69 | Temp 98.6°F | Resp 16 | Wt 155.0 lb

## 2021-09-13 DIAGNOSIS — C8332 Diffuse large B-cell lymphoma, intrathoracic lymph nodes: Secondary | ICD-10-CM | POA: Diagnosis not present

## 2021-09-13 DIAGNOSIS — Z1231 Encounter for screening mammogram for malignant neoplasm of breast: Secondary | ICD-10-CM | POA: Diagnosis not present

## 2021-09-13 DIAGNOSIS — Z01818 Encounter for other preprocedural examination: Secondary | ICD-10-CM | POA: Insufficient documentation

## 2021-09-13 LAB — COMPREHENSIVE METABOLIC PANEL
ALT: 25 U/L (ref 0–35)
AST: 28 U/L (ref 0–37)
Albumin: 4.4 g/dL (ref 3.5–5.2)
Alkaline Phosphatase: 81 U/L (ref 39–117)
BUN: 14 mg/dL (ref 6–23)
CO2: 27 mEq/L (ref 19–32)
Calcium: 9.7 mg/dL (ref 8.4–10.5)
Chloride: 101 mEq/L (ref 96–112)
Creatinine, Ser: 0.66 mg/dL (ref 0.40–1.20)
GFR: 90.77 mL/min (ref >60.00)
Glucose, Bld: 84 mg/dL (ref 70–99)
Potassium: 4.3 mEq/L (ref 3.5–5.1)
Sodium: 137 mEq/L (ref 135–145)
Total Bilirubin: 0.4 mg/dL (ref 0.2–1.2)
Total Protein: 6.4 g/dL (ref 6.0–8.3)

## 2021-09-13 LAB — CBC WITH DIFFERENTIAL/PLATELET
Basophils Absolute: 0 10*3/uL (ref 0.0–0.1)
Basophils Relative: 0.2 % (ref 0.0–3.0)
Eosinophils Absolute: 0 10*3/uL (ref 0.0–0.7)
Eosinophils Relative: 0.6 % (ref 0.0–5.0)
HCT: 31.1 % — ABNORMAL LOW (ref 36.0–46.0)
Hemoglobin: 10.5 g/dL — ABNORMAL LOW (ref 12.0–15.0)
Lymphocytes Relative: 53.2 % — ABNORMAL HIGH (ref 12.0–46.0)
Lymphs Abs: 3.4 10*3/uL (ref 0.7–4.0)
MCHC: 33.8 g/dL (ref 30.0–36.0)
MCV: 96.8 fl (ref 78.0–100.0)
Monocytes Absolute: 0.8 10*3/uL (ref 0.1–1.0)
Monocytes Relative: 12.4 % — ABNORMAL HIGH (ref 3.0–12.0)
Neutro Abs: 2.1 10*3/uL (ref 1.4–7.7)
Neutrophils Relative %: 33.6 % — ABNORMAL LOW (ref 43.0–77.0)
Platelets: 154 10*3/uL (ref 150.0–400.0)
RBC: 3.21 Mil/uL — ABNORMAL LOW (ref 3.87–5.11)
RDW: 25.9 % — ABNORMAL HIGH (ref 11.5–15.5)
WBC: 6.3 10*3/uL (ref 4.0–10.5)

## 2021-09-13 LAB — PROTIME-INR
INR: 0.9 ratio (ref 0.8–1.0)
Prothrombin Time: 9.6 s (ref 9.6–13.1)

## 2021-09-13 NOTE — Progress Notes (Addendum)
Subjective:   By signing my name below, I, Kellie Simmering, attest that this documentation has been prepared under the direction and in the presence of [name], NP 09/13/2021.   Patient ID: Ariel [name], female    DOB: Dec 27, 1955, 66 y.o.   MRN: [id number]  Chief Complaint  Patient presents with   Pre-op Exam    Here for check up prior to left hip replacement.     HPI Patient is in today for an office visit for pre-operative evaluation   She denies having any fever, congestion, sinus pain, sore throat, chest pain, palpitations, cough, shortness of breath, wheezing, nausea, vomiting, diarrhea, constipation, dysuria, frequency, abdominal pain, hematuria and headaches.  Lymphoma/Chemotherapy- She has completed her chemotherapy. She reports that she had a follow up MRI and PET scan back in March.  MRI did not show any residual disease in the brain. Pet scan was negative. She has another MRI and PET scan scheduled for next week.   Total hip replacement- She is scheduled for a total hip replacement on the left with Dr. Rhona Raider in 1 month. She reports having mild pain associated with the hip replacement.   Mammogram- She is interested in having a mammogram.   Immunizations- She wants to wait until after her surgery to receive the pneumonia immunization. She has received one [employer]  COVID-19 vaccination. She did not receive the flu vaccination last year.  Past Medical History:  Diagnosis Date   Chicken pox    Complication of anesthesia    Per patient, very slow to wake up after anesthesia   Goals of care, counseling/discussion 12/22/2020   High cholesterol    High grade B-cell lymphoma ([place]) 12/22/2020   Hyperglycemia 11/10/2020   Melanoma ([place]) 2021   right hip   Melanoma ([place]) 10/28/2020   pt stated brain liver and bladder   PONV (postoperative nausea and vomiting)    Urinary incontinence     Past Surgical History:  Procedure Laterality Date   AIR/FLUID  EXCHANGE Right 12/07/2020   Procedure: AIR/FLUID EXCHANGE;  Surgeon: Jalene Mullet, MD;  Location: [facility] [facility];  Service: Ophthalmology;  Laterality: Right;   APPENDECTOMY  1962   BIOPSY  12/14/2020   Procedure: BIOPSY;  Surgeon: Rush Landmark Telford Nab., MD;  Location: [place];  Service: Gastroenterology;;   ESOPHAGOGASTRODUODENOSCOPY (EGD) WITH PROPOFOL N/A 12/14/2020   Procedure: ESOPHAGOGASTRODUODENOSCOPY (EGD) WITH PROPOFOL;  Surgeon: Irving Copas., MD;  Location: [place];  Service: Gastroenterology;  Laterality: N/A;   EXCISION MELANOMA WITH SENTINEL LYMPH NODE BIOPSY Right 10/09/2019   Procedure: WIDE LOCAL EXCISION WITH ADVANCEMENT FLAP CLOSURE RIGHT HIP MELANOMA WITH SENTINEL LYMPH NODE BIOPSY;  Surgeon: Stark Klein, MD;  Location: [place];  Service: General;  Laterality: Right;   FINE NEEDLE ASPIRATION  12/14/2020   Procedure: FINE NEEDLE ASPIRATION (FNA) LINEAR;  Surgeon: Irving Copas., MD;  Location: [place];  Service: Gastroenterology;;   HERNIA REPAIR  2009   IR IMAGING GUIDED PORT INSERTION  12/30/2020   MOUTH SURGERY  11/2015   gum surgery and bone implant   PARS PLANA VITRECTOMY Right 12/07/2020   Procedure: PARS PLANA VITRECTOMY WITH 25 GAUGE;  Surgeon: Jalene Mullet, MD;  Location: [place];  Service: Ophthalmology;  Laterality: Right;   PHOTOCOAGULATION WITH LASER Right 12/07/2020   Procedure: PHOTOCOAGULATION WITH ENDOLASER PANRITINAL COAGULATION;  Surgeon: Jalene Mullet, MD;  Location: [place];  Service: Ophthalmology;  Laterality: Right;   UPPER ESOPHAGEAL ENDOSCOPIC ULTRASOUND (EUS) N/A 12/14/2020   Procedure:  UPPER ESOPHAGEAL ENDOSCOPIC ULTRASOUND (EUS);  Surgeon: Irving Copas., MD;  Location: [place];  Service: Gastroenterology;  Laterality: N/A;   WISDOM TOOTH EXTRACTION      Family History  Problem Relation Age of Onset   Coronary artery disease Father        died at 8   COPD Father    Alcohol abuse Father     Diabetes Mellitus II Father    Coronary artery disease Sister    Cancer Sister        breast   Heart attack Brother    Hyperlipidemia Other        died 59- "natural causes"   Hyperlipidemia Other        4 siblings    Hypertension Other        3 siblings    Social History   Socioeconomic History   Marital status: Widowed    Spouse name: Not on file   Number of children: Not on file   Years of education: Not on file   Highest education level: Not on file  Occupational History   Occupation: retired  Tobacco Use   Smoking status: Never   Smokeless tobacco: Never  Vaping Use   Vaping Use: Never used  Substance and Sexual Activity   Alcohol use: Never   Drug use: Never   Sexual activity: Not Currently  Other Topics Concern   Not on file  Social History Narrative   05/09/2021   Presently living at [facility] in [place].   One Son- lives locally, 1 grand-daughter[place]aughter- 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   Right handed   Home one level    Caffeine 1-cup   Social Determinants of Health   Financial Resource Strain: Low Risk  (07/13/2021)   Overall Financial Resource Strain (CARDIA)    Difficulty of Paying Living Expenses: Not hard at all  Food Insecurity: No Food Insecurity (07/13/2021)   Hunger Vital Sign    Worried About Running Out of Food in the Last Year: Never true    Ran Out of Food in the Last Year: Never true  Transportation Needs: No Transportation Needs (07/13/2021)   PRAPARE - Hydrologist (Medical): No    Lack of Transportation (Non-Medical): No  Physical Activity: Insufficiently Active (07/13/2021)   Exercise Vital Sign    Days of Exercise per Week: 3 days    Minutes of Exercise per Session: 20 min  Stress: No Stress Concern Present (07/13/2021)   [place]    Feeling of Stress : Not at all  Social Connections: Socially  Isolated (07/13/2021)   Social Connection and Isolation Panel [NHANES]    Frequency of Communication with Friends and Family: More than three times a week    Frequency of Social Gatherings with Friends and Family: Once a week    Attends Religious Services: Never    Marine scientist or Organizations: No    Attends Archivist Meetings: Never    Marital Status: Widowed  Intimate Partner Violence: Not At Risk (07/13/2021)   Humiliation, Afraid, Rape, and Kick questionnaire    Fear of Current or Ex-Partner: No    Emotionally Abused: No    Physically Abused: No    Sexually Abused: No    Outpatient Medications Prior to Visit  Medication Sig Dispense Refill   acyclovir (ZOVIRAX) 200 MG capsule TAKE 1 CAPSULE TWICE A DAY. (  Patient taking differently: Take 200 mg by mouth 2 (two) times daily.) 60 capsule 2   antiseptic oral rinse (BIOTENE) LIQD 15 mLs by Mouth Rinse route every 4 (four) hours. 500 mL 4   docusate sodium (COLACE) 100 MG capsule Take 1 capsule (100 mg total) by mouth 2 (two) times daily as needed for mild constipation. 10 capsule 0   OVER THE COUNTER MEDICATION Take 3 capsules by mouth See admin instructions. Balance of Nature VEGETABLE formulation capsules- Take 3 capsules by mouth every morning     OVER THE COUNTER MEDICATION Take 3 capsules by mouth See admin instructions. Balance of Nature FRUIT formulation capsules- Take 3 capsules by mouth every morning     traMADol (ULTRAM) 50 MG tablet Take one (50 mg) to two (100 mg) tablets every six hours as needed for pain 60 tablet 0   VUITY 1.25 % SOLN Place 1 drop into both eyes in the morning and at bedtime.     ciprofloxacin (CIPRO) 500 MG tablet Take 1 tablet (500 mg total) by mouth daily with breakfast. (Patient not taking: Reported on 08/26/2021) 15 tablet 0   ondansetron (ZOFRAN) 8 MG tablet Take 1 tablet (8 mg total) by mouth every 8 (eight) hours as needed for nausea or vomiting. (Patient not taking: Reported on  08/26/2021) 30 tablet 3   No facility-administered medications prior to visit.    Allergies  Allergen Reactions   Decadron [Dexamethasone] Other (See Comments)    Made the patient's legs tired and she could not walk. Hallucinations   Plavix [Clopidogrel] Hives, Swelling and Other (See Comments)    Lips became swollen    Prednisone Other (See Comments)    Steroids caused psychological issues  Hallucinations   Codeine Nausea And Vomiting    Review of Systems  HENT:  Negative for congestion, sinus pain and sore throat.   Respiratory:  Negative for cough, sputum production, shortness of breath and wheezing.   Cardiovascular:  Negative for chest pain.  Gastrointestinal:  Negative for abdominal pain, constipation, diarrhea, nausea and vomiting.  Genitourinary:  Negative for dysuria, frequency, hematuria and urgency.  Skin:  Negative for rash.  Neurological:  Negative for headaches.  Psychiatric/Behavioral:  Negative for depression.        Objective:    Physical Exam Constitutional:      General: She is not in acute distress.    Appearance: Normal appearance. She is not ill-appearing.  HENT:     Head: Normocephalic and atraumatic.     Right Ear: Tympanic membrane, ear canal and external ear normal.     Left Ear: Tympanic membrane, ear canal and external ear normal.  Eyes:     Extraocular Movements: Extraocular movements intact.     Pupils: Pupils are equal, round, and reactive to light.  Neck:     Vascular: No carotid bruit.  Cardiovascular:     Rate and Rhythm: Normal rate and regular rhythm.     Pulses: Normal pulses.     Heart sounds: Normal heart sounds. No murmur heard.    No gallop.  Pulmonary:     Effort: Pulmonary effort is normal. No respiratory distress.     Breath sounds: Normal breath sounds. No wheezing or rales.  Lymphadenopathy:     Cervical: No cervical adenopathy.  Skin:    General: Skin is warm and dry.  Neurological:     Mental Status: She is alert  and oriented to person, place, and time.  Psychiatric:  Mood and Affect: Mood normal.        Behavior: Behavior normal.        Judgment: Judgment normal.     BP (!) 127/57 (BP Location: Right Arm, Patient Position: Sitting, Cuff Size: Small)   Pulse 69   Temp 98.6 F (37 C) (Oral)   Resp 16   Wt 155 lb (70.3 kg)   SpO2 95%   BMI 25.02 kg/m  Wt Readings from Last 3 Encounters:  09/13/21 155 lb (70.3 kg)  09/05/21 154 lb (69.9 kg)  08/26/21 154 lb (69.9 kg)      Assessment & Plan:   Problem List Items Addressed This Visit       Unprioritized   Pre-op evaluation - Primary    Will obtain studies as below. Clearance pending review of these studies. EKG tracing is personally reviewed.  EKG notes NSR.  No acute changes.        Relevant Orders   Comp Met (CMET)   CBC with Differential/Platelet   Urine Culture   DG Chest 2 View   EKG 12-Lead (Completed)   Protime-INR   Encounter for screening mammogram for malignant neoplasm of breast   Relevant Orders   MM 3D SCREEN BREAST BILATERAL   Diffuse large B cell lymphoma ([place])    She is showing great clinical response.  Management per Oncology.        No orders of the defined types were placed in this encounter.  I, Nance Pear, NP, personally preformed the services described in this documentation.  All medical record entries made by the scribe were at my direction and in my presence.  I have reviewed the chart and discharge instructions (if applicable) and agree that the record reflects my personal performance and is accurate and complete. 09/13/2021.  I,Mohammed Iqbal,acting as a Education administrator for [employer], NP.,have documented all relevant documentation on the behalf of Nance Pear, NP,as directed by  Nance Pear, NP while in the presence of Nance Pear, NP.  Nance Pear, NP

## 2021-09-13 NOTE — Assessment & Plan Note (Signed)
Will obtain studies as below. Clearance pending review of these studies. EKG tracing is personally reviewed.  EKG notes NSR.  No acute changes.

## 2021-09-13 NOTE — Assessment & Plan Note (Signed)
She is showing great clinical response.  Management per Oncology.

## 2021-09-13 NOTE — Progress Notes (Signed)
MATRix regimen complete per Dr. Marin Olp. Orders discontinued.

## 2021-09-14 LAB — URINE CULTURE
MICRO NUMBER:: 13661401
Result:: NO GROWTH
SPECIMEN QUALITY:: ADEQUATE

## 2021-09-15 ENCOUNTER — Telehealth (HOSPITAL_BASED_OUTPATIENT_CLINIC_OR_DEPARTMENT_OTHER): Payer: Self-pay

## 2021-09-19 ENCOUNTER — Telehealth: Payer: Self-pay | Admitting: Family

## 2021-09-19 MED ORDER — MULTI-VITAMIN/MINERALS PO TABS: 1.0000 | ORAL_TABLET | Freq: Every day | ORAL | Status: DC

## 2021-09-19 NOTE — Telephone Encounter (Signed)
Patient advised of results and provider's comments, she verbalized understanding and will follow instructions.

## 2021-09-19 NOTE — Telephone Encounter (Signed)
Rod Holler, Please advise pt that her hemoglobin was 9.5 when I saw her.  I would really like for her hemoglobin to be closer to 11 when she has her surgery. I see that she has follow up with Dr. Marin Olp on 8/8 with follow up blood work. I am going to wait on that lab work and hopefully blood count will be improved at that time and I can send the clearance letter to her surgeon for planned surgery on 8/15.  Please advise pt to add a multvitamin with minerals tab once daily.

## 2021-09-20 NOTE — Progress Notes (Addendum)
COVID Vaccine Completed:  Yes  Date of COVID positive in last 90 days:  PCP - [name], NP Cardiologist - Shirlee More, MD Neurologist - Alonza Bogus, DO  Neurology clearance in [place] dated 08-26-21 by Alonza Bogus, DO  Oncology clearance in [place] dated 09-05-21 by Dr. Marin Olp  Chest x-ray - 09-13-21 Epic EKG - 09-13-21 Epic Stress Test -  ECHO - 10-07-20 Epic Cardiac Cath -  Pacemaker/ICD device last checked: Spinal Cord Stimulator: Telemetry Monitor - 2022 Epic Cardiac CT - 2020 Epic  Bowel Prep -   Sleep Study -  CPAP -   Fasting Blood Sugar -  Checks Blood Sugar _____ times a day  Blood Thinner Instructions: Aspirin Instructions: Last Dose:  Activity level:  Can go up a flight of stairs and perform activities of daily living without stopping and without symptoms of chest pain or shortness of breath.  Able to exercise without symptoms  Unable to go up a flight of stairs without symptoms of     Anesthesia review:   Followed by cardiology for ?  TIA  (neuro felt symptoms related to non-Hodgkin's lymphoma) and episode of dyspnea on exertion.    Patient denies shortness of breath, fever, cough and chest pain at PAT appointment  Patient verbalized understanding of instructions that were given to them at the PAT appointment. Patient was also instructed that they will need to review over the PAT instructions again at home before surgery.

## 2021-09-20 NOTE — Patient Instructions (Signed)
SURGICAL WAITING ROOM VISITATION Patients having surgery or a procedure may have no more than 2 support people in the waiting area - these visitors may rotate.   Children under the age of 38 must have an adult with them who is not the patient. If the patient needs to stay at the hospital during part of their recovery, the visitor guidelines for inpatient rooms apply. Pre-op nurse will coordinate an appropriate time for 1 support person to accompany patient in pre-op.  This support person may not rotate.    Please refer to the [facility] website for the visitor guidelines for Inpatients (after your surgery is over and you are in a regular room).      Your procedure is scheduled on:  10-11-21   Report to [facility] Main Entrance    Report to admitting at 5:15 AM   Call this number if you have problems the morning of surgery [phone]   Do not eat food :After Midnight.   After Midnight you may have the following liquids until 4:30 AM DAY OF SURGERY  Water Non-Citrus Juices (without pulp, NO RED) Carbonated Beverages Black Coffee (NO MILK/CREAM OR CREAMERS, sugar ok)  Clear Tea (NO MILK/CREAM OR CREAMERS, sugar ok) regular and decaf                             Plain Jell-O (NO RED)                                           Fruit ices (not with fruit pulp, NO RED)                                     Popsicles (NO RED)                                                               Sports drinks like Gatorade (NO RED)              Drink 2 Ensure/G2 drinks AT 10:00 PM the night before surgery.        The day of surgery:  Drink ONE (1) Pre-Surgery Clear Ensure at 4:30 AM the morning of surgery. Drink in one sitting. Do not sip.  This drink was given to you during your hospital  pre-op appointment visit. Nothing else to drink after completing the Pre-Surgery Clear Ensure .          If you have questions, please contact your surgeon's office.   FOLLOW ANY ADDITIONAL PRE OP  INSTRUCTIONS YOU RECEIVED FROM YOUR SURGEON'S OFFICE!!!     Oral Hygiene is also important to reduce your risk of infection.                                    Remember - BRUSH YOUR TEETH THE MORNING OF SURGERY WITH YOUR REGULAR TOOTHPASTE   Do NOT smoke after Midnight   Take these medicines the morning of surgery with A SIP  OF WATER:  Acyclovir, Tramadol if needed                              You may not have any metal on your body including hair pins, jewelry, and body piercing             Do not wear make-up, lotions, powders, perfumes or deodorant  Do not wear nail polish including gel and S&S, artificial/acrylic nails, or any other type of covering on natural nails including finger and toenails. If you have artificial nails, gel coating, etc. that needs to be removed by a nail salon please have this removed prior to surgery or surgery may need to be canceled/ delayed if the surgeon/ anesthesia feels like they are unable to be safely monitored.   Do not shave  48 hours prior to surgery.    Do not bring valuables to the hospital. [place].   Contacts, dentures or bridgework may not be worn into surgery.   Bring small overnight bag day of surgery.   DO NOT [place]. PHARMACY WILL DISPENSE MEDICATIONS LISTED ON YOUR MEDICATION LIST TO YOU DURING YOUR ADMISSION [place]!   Patients discharged on the day of surgery will not be allowed to drive home.  Someone NEEDS to stay with you for the first 24 hours after anesthesia.  Special Instructions: Bring a copy of your healthcare power of attorney and living will documents the day of surgery if you haven't scanned them before.  Please read over the following fact sheets you were given: IF YOU HAVE QUESTIONS ABOUT YOUR PRE-OP INSTRUCTIONS PLEASE CALL [facility] - Preparing for Surgery Before surgery, you can play an important role.  Because  skin is not sterile, your skin needs to be as free of germs as possible.  You can reduce the number of germs on your skin by washing with CHG (chlorahexidine gluconate) soap before surgery.  CHG is an antiseptic cleaner which kills germs and bonds with the skin to continue killing germs even after washing. Please DO NOT use if you have an allergy to CHG or antibacterial soaps.  If your skin becomes reddened/irritated stop using the CHG and inform your nurse when you arrive at Short Stay. Do not shave (including legs and underarms) for at least 48 hours prior to the first CHG shower.  You may shave your face/neck.  Please follow these instructions carefully:  1.  Shower with CHG Soap the night before surgery and the  morning of surgery.  2.  If you choose to wash your hair, wash your hair first as usual with your normal  shampoo.  3.  After you shampoo, rinse your hair and body thoroughly to remove the shampoo.                             4.  Use CHG as you would any other liquid soap.  You can apply chg directly to the skin and wash.  Gently with a scrungie or clean washcloth.  5.  Apply the CHG Soap to your body ONLY FROM THE NECK DOWN.   Do   not use on face/ open  Wound or open sores. Avoid contact with eyes, ears mouth and   genitals (private parts).                       Wash face,  Genitals (private parts) with your normal soap.             6.  Wash thoroughly, paying special attention to the area where your    surgery  will be performed.  7.  Thoroughly rinse your body with warm water from the neck down.  8.  DO NOT shower/wash with your normal soap after using and rinsing off the CHG Soap.                9.  Pat yourself dry with a clean towel.            10.  Wear clean pajamas.            11.  Place clean sheets on your bed the night of your first shower and do not  sleep with pets. Day of Surgery : Do not apply any lotions/deodorants the morning of surgery.   Please wear clean clothes to the hospital/surgery center.  FAILURE TO FOLLOW THESE INSTRUCTIONS MAY RESULT IN THE CANCELLATION OF YOUR SURGERY  PATIENT SIGNATURE_________________________________  NURSE SIGNATURE__________________________________  ________________________________________________________________________     Adam Phenix  An incentive spirometer is a tool that can help keep your lungs clear and active. This tool measures how well you are filling your lungs with each breath. Taking long deep breaths may help reverse or decrease the chance of developing breathing (pulmonary) problems (especially infection) following: A long period of time when you are unable to move or be active. BEFORE THE PROCEDURE  If the spirometer includes an indicator to show your best effort, your nurse or respiratory therapist will set it to a desired goal. If possible, sit up straight or lean slightly forward. Try not to slouch. Hold the incentive spirometer in an upright position. INSTRUCTIONS FOR USE  Sit on the edge of your bed if possible, or sit up as far as you can in bed or on a chair. Hold the incentive spirometer in an upright position. Breathe out normally. Place the mouthpiece in your mouth and seal your lips tightly around it. Breathe in slowly and as deeply as possible, raising the piston or the ball toward the top of the column. Hold your breath for 3-5 seconds or for as long as possible. Allow the piston or ball to fall to the bottom of the column. Remove the mouthpiece from your mouth and breathe out normally. Rest for a few seconds and repeat Steps 1 through 7 at least 10 times every 1-2 hours when you are awake. Take your time and take a few normal breaths between deep breaths. The spirometer may include an indicator to show your best effort. Use the indicator as a goal to work toward during each repetition. After each set of 10 deep breaths, practice coughing to be sure  your lungs are clear. If you have an incision (the cut made at the time of surgery), support your incision when coughing by placing a pillow or rolled up towels firmly against it. Once you are able to get out of bed, walk around indoors and cough well. You may stop using the incentive spirometer when instructed by your caregiver.  RISKS AND COMPLICATIONS Take your time so you do not get dizzy or light-headed. If you are in  pain, you may need to take or ask for pain medication before doing incentive spirometry. It is harder to take a deep breath if you are having pain. AFTER USE Rest and breathe slowly and easily. It can be helpful to keep track of a log of your progress. Your caregiver can provide you with a simple table to help with this. If you are using the spirometer at home, follow these instructions: [place] IF:  You are having difficultly using the spirometer. You have trouble using the spirometer as often as instructed. Your pain medication is not giving enough relief while using the spirometer. You develop fever of 100.5 F (38.1 C) or higher. SEEK IMMEDIATE MEDICAL CARE IF:  You cough up bloody sputum that had not been present before. You develop fever of 102 F (38.9 C) or greater. You develop worsening pain at or near the incision site. MAKE SURE YOU:  Understand these instructions. Will watch your condition. Will get help right away if you are not doing well or get worse. Document Released: 06/26/2006 Document Revised: 05/08/2011 Document Reviewed: 08/27/2006 [employer] Patient Information 2014 [employer], [place].   ________________________________________________________________________  WHAT IS A BLOOD TRANSFUSION? Blood Transfusion Information  A transfusion is the replacement of blood or some of its parts. Blood is made up of multiple cells which provide different functions. Red blood cells carry oxygen and are used for blood loss replacement. White blood cells  fight against infection. Platelets control bleeding. Plasma helps clot blood. Other blood products are available for specialized needs, such as hemophilia or other clotting disorders. BEFORE THE TRANSFUSION  Who gives blood for transfusions?  Healthy volunteers who are fully evaluated to make sure their blood is safe. This is blood bank blood. Transfusion therapy is the safest it has ever been in the practice of medicine. Before blood is taken from a donor, a complete history is taken to make sure that person has no history of diseases nor engages in risky social behavior (examples are intravenous drug use or sexual activity with multiple partners). The donor's travel history is screened to minimize risk of transmitting infections, such as malaria. The donated blood is tested for signs of infectious diseases, such as HIV and hepatitis. The blood is then tested to be sure it is compatible with you in order to minimize the chance of a transfusion reaction. If you or a relative donates blood, this is often done in anticipation of surgery and is not appropriate for emergency situations. It takes many days to process the donated blood. RISKS AND COMPLICATIONS Although transfusion therapy is very safe and saves many lives, the main dangers of transfusion include:  Getting an infectious disease. Developing a transfusion reaction. This is an allergic reaction to something in the blood you were given. Every precaution is taken to prevent this. The decision to have a blood transfusion has been considered carefully by your caregiver before blood is given. Blood is not given unless the benefits outweigh the risks. AFTER THE TRANSFUSION Right after receiving a blood transfusion, you will usually feel much better and more energetic. This is especially true if your red blood cells have gotten low (anemic). The transfusion raises the level of the red blood cells which carry oxygen, and this usually causes an energy  increase. The nurse administering the transfusion will monitor you carefully for complications. HOME CARE INSTRUCTIONS  No special instructions are needed after a transfusion. You may find your energy is better. Speak with your caregiver about any  limitations on activity for underlying diseases you may have. SEEK MEDICAL CARE IF:  Your condition is not improving after your transfusion. You develop redness or irritation at the intravenous (IV) site. SEEK IMMEDIATE MEDICAL CARE IF:  Any of the following symptoms occur over the next 12 hours: Shaking chills. You have a temperature by mouth above 102 F (38.9 C), not controlled by medicine. Chest, back, or muscle pain. People around you feel you are not acting correctly or are confused. Shortness of breath or difficulty breathing. Dizziness and fainting. You get a rash or develop hives. You have a decrease in urine output. Your urine turns a dark color or changes to pink, red, or brown. Any of the following symptoms occur over the next 10 days: You have a temperature by mouth above 102 F (38.9 C), not controlled by medicine. Shortness of breath. Weakness after normal activity. The white part of the eye turns yellow (jaundice). You have a decrease in the amount of urine or are urinating less often. Your urine turns a dark color or changes to pink, red, or brown. Document Released: 02/11/2000 Document Revised: 05/08/2011 Document Reviewed: 09/30/2007 [facility] Patient Information 2014 [place], [place].  _______________________________________________________________________

## 2021-09-21 ENCOUNTER — Telehealth: Payer: Self-pay | Admitting: Family

## 2021-09-21 NOTE — Telephone Encounter (Signed)
Wells Guiles from [place] [facility] called to follow up on surgery cleareance. Advised that patient had a visit on 7/18 but the provider was waiting on EKG. Ortencia Kick I would have CMA call her back at [phone] to advise.

## 2021-09-21 NOTE — Patient Instructions (Signed)
SURGICAL WAITING ROOM VISITATION Patients having surgery or a procedure may have no more than 2 support people in the waiting area - these visitors may rotate.   Children under the age of 72 must have an adult with them who is not the patient. If the patient needs to stay at the hospital during part of their recovery, the visitor guidelines for inpatient rooms apply. Pre-op nurse will coordinate an appropriate time for 1 support person to accompany patient in pre-op.  This support person may not rotate.    Please refer to the [facility] website for the visitor guidelines for Inpatients (after your surgery is over and you are in a regular room).      Your procedure is scheduled on: 10-11-21   Report to [facility] Main Entrance    Report to admitting at 5:15 AM   Call this number if you have problems the morning of surgery [phone]   Do not eat food :After Midnight.   After Midnight you may have the following liquids until  4:30 AM DAY OF SURGERY  Water Non-Citrus Juices (without pulp, NO RED) Carbonated Beverages Black Coffee (NO MILK/CREAM OR CREAMERS, sugar ok)  Clear Tea (NO MILK/CREAM OR CREAMERS, sugar ok) regular and decaf                             Plain Jell-O (NO RED)                                           Fruit ices (not with fruit pulp, NO RED)                                     Popsicles (NO RED)                                                               Sports drinks like Gatorade (NO RED)                  The day of surgery:  Drink ONE (1) Pre-Surgery Clear Ensure at 4:30 AM the morning of surgery. Drink in one sitting. Do not sip.  This drink was given to you during your hospital  pre-op appointment visit. Nothing else to drink after completing the Pre-Surgery Clear Ensure           If you have questions, please contact your surgeon's office.   FOLLOW ANY ADDITIONAL PRE OP INSTRUCTIONS YOU RECEIVED FROM YOUR SURGEON'S OFFICE!!!     Oral  Hygiene is also important to reduce your risk of infection.                                    Remember - BRUSH YOUR TEETH THE MORNING OF SURGERY WITH YOUR REGULAR TOOTHPASTE   Do NOT smoke after Midnight   Take these medicines the morning of surgery with A SIP OF WATER:  Acyclovir, Tramadol if needed  You may not have any metal on your body including hair pins, jewelry, and body piercing             Do not wear make-up, lotions, powders, perfumes or deodorant  Do not wear nail polish including gel and S&S, artificial/acrylic nails, or any other type of covering on natural nails including finger and toenails. If you have artificial nails, gel coating, etc. that needs to be removed by a nail salon please have this removed prior to surgery or surgery may need to be canceled/ delayed if the surgeon/ anesthesia feels like they are unable to be safely monitored.   Do not shave  48 hours prior to surgery.    Do not bring valuables to the hospital. [place].   Contacts, dentures or bridgework may not be worn into surgery.   Bring small overnight bag day of surgery.   DO NOT [place]. PHARMACY WILL DISPENSE MEDICATIONS LISTED ON YOUR MEDICATION LIST TO YOU DURING YOUR ADMISSION [place]!     Special Instructions: Bring a copy of your healthcare power of attorney and living will documents the day of surgery if you haven't scanned them before.  Please read over the following fact sheets you were given: IF YOU HAVE QUESTIONS ABOUT YOUR PRE-OP INSTRUCTIONS PLEASE CALL [facility] - Preparing for Surgery Before surgery, you can play an important role.  Because skin is not sterile, your skin needs to be as free of germs as possible.  You can reduce the number of germs on your skin by washing with CHG (chlorahexidine gluconate) soap before surgery.  CHG is an antiseptic cleaner  which kills germs and bonds with the skin to continue killing germs even after washing. Please DO NOT use if you have an allergy to CHG or antibacterial soaps.  If your skin becomes reddened/irritated stop using the CHG and inform your nurse when you arrive at Short Stay. Do not shave (including legs and underarms) for at least 48 hours prior to the first CHG shower.  You may shave your face/neck.  Please follow these instructions carefully:  1.  Shower with CHG Soap the night before surgery and the  morning of surgery.  2.  If you choose to wash your hair, wash your hair first as usual with your normal  shampoo.  3.  After you shampoo, rinse your hair and body thoroughly to remove the shampoo.                             4.  Use CHG as you would any other liquid soap.  You can apply chg directly to the skin and wash.  Gently with a scrungie or clean washcloth.  5.  Apply the CHG Soap to your body ONLY FROM THE NECK DOWN.   Do   not use on face/ open                           Wound or open sores. Avoid contact with eyes, ears mouth and   genitals (private parts).                       Wash face,  Genitals (private parts) with your normal soap.             6.  Wash thoroughly, paying special attention to the area where your    surgery  will be performed.  7.  Thoroughly rinse your body with warm water from the neck down.  8.  DO NOT shower/wash with your normal soap after using and rinsing off the CHG Soap.                9.  Pat yourself dry with a clean towel.            10.  Wear clean pajamas.            11.  Place clean sheets on your bed the night of your first shower and do not  sleep with pets. Day of Surgery : Do not apply any lotions/deodorants the morning of surgery.  Please wear clean clothes to the hospital/surgery center.  FAILURE TO FOLLOW THESE INSTRUCTIONS MAY RESULT IN THE CANCELLATION OF YOUR SURGERY  PATIENT SIGNATURE_________________________________  NURSE  SIGNATURE__________________________________  ________________________________________________________________________     Ariel [name]  An incentive spirometer is a tool that can help keep your lungs clear and active. This tool measures how well you are filling your lungs with each breath. Taking long deep breaths may help reverse or decrease the chance of developing breathing (pulmonary) problems (especially infection) following: A long period of time when you are unable to move or be active. BEFORE THE PROCEDURE  If the spirometer includes an indicator to show your best effort, your nurse or respiratory therapist will set it to a desired goal. If possible, sit up straight or lean slightly forward. Try not to slouch. Hold the incentive spirometer in an upright position. INSTRUCTIONS FOR USE  Sit on the edge of your bed if possible, or sit up as far as you can in bed or on a chair. Hold the incentive spirometer in an upright position. Breathe out normally. Place the mouthpiece in your mouth and seal your lips tightly around it. Breathe in slowly and as deeply as possible, raising the piston or the ball toward the top of the column. Hold your breath for 3-5 seconds or for as long as possible. Allow the piston or ball to fall to the bottom of the column. Remove the mouthpiece from your mouth and breathe out normally. Rest for a few seconds and repeat Steps 1 through 7 at least 10 times every 1-2 hours when you are awake. Take your time and take a few normal breaths between deep breaths. The spirometer may include an indicator to show your best effort. Use the indicator as a goal to work toward during each repetition. After each set of 10 deep breaths, practice coughing to be sure your lungs are clear. If you have an incision (the cut made at the time of surgery), support your incision when coughing by placing a pillow or rolled up towels firmly against it. Once you are able to get out  of bed, walk around indoors and cough well. You may stop using the incentive spirometer when instructed by your caregiver.  RISKS AND COMPLICATIONS Take your time so you do not get dizzy or light-headed. If you are in pain, you may need to take or ask for pain medication before doing incentive spirometry. It is harder to take a deep breath if you are having pain. AFTER USE Rest and breathe slowly and easily. It can be helpful to keep track of a log of your progress. Your caregiver can provide you with a simple table to help with this.  If you are using the spirometer at home, follow these instructions: [place] IF:  You are having difficultly using the spirometer. You have trouble using the spirometer as often as instructed. Your pain medication is not giving enough relief while using the spirometer. You develop fever of 100.5 F (38.1 C) or higher. SEEK IMMEDIATE MEDICAL CARE IF:  You cough up bloody sputum that had not been present before. You develop fever of 102 F (38.9 C) or greater. You develop worsening pain at or near the incision site. MAKE SURE YOU:  Understand these instructions. Will watch your condition. Will get help right away if you are not doing well or get worse. Document Released: 06/26/2006 Document Revised: 05/08/2011 Document Reviewed: 08/27/2006 Exit[employer] Patient Information 2014 [employer], [place].   ________________________________________________________________________  WHAT IS A BLOOD TRANSFUSION? Blood Transfusion Information  A transfusion is the replacement of blood or some of its parts. Blood is made up of multiple cells which provide different functions. Red blood cells carry oxygen and are used for blood loss replacement. White blood cells fight against infection. Platelets control bleeding. Plasma helps clot blood. Other blood products are available for specialized needs, such as hemophilia or other clotting disorders. BEFORE THE TRANSFUSION   Who gives blood for transfusions?  Healthy volunteers who are fully evaluated to make sure their blood is safe. This is blood bank blood. Transfusion therapy is the safest it has ever been in the practice of medicine. Before blood is taken from a donor, a complete history is taken to make sure that person has no history of diseases nor engages in risky social behavior (examples are intravenous drug use or sexual activity with multiple partners). The donor's travel history is screened to minimize risk of transmitting infections, such as malaria. The donated blood is tested for signs of infectious diseases, such as HIV and hepatitis. The blood is then tested to be sure it is compatible with you in order to minimize the chance of a transfusion reaction. If you or a relative donates blood, this is often done in anticipation of surgery and is not appropriate for emergency situations. It takes many days to process the donated blood. RISKS AND COMPLICATIONS Although transfusion therapy is very safe and saves many lives, the main dangers of transfusion include:  Getting an infectious disease. Developing a transfusion reaction. This is an allergic reaction to something in the blood you were given. Every precaution is taken to prevent this. The decision to have a blood transfusion has been considered carefully by your caregiver before blood is given. Blood is not given unless the benefits outweigh the risks. AFTER THE TRANSFUSION Right after receiving a blood transfusion, you will usually feel much better and more energetic. This is especially true if your red blood cells have gotten low (anemic). The transfusion raises the level of the red blood cells which carry oxygen, and this usually causes an energy increase. The nurse administering the transfusion will monitor you carefully for complications. HOME CARE INSTRUCTIONS  No special instructions are needed after a transfusion. You may find your energy is  better. Speak with your caregiver about any limitations on activity for underlying diseases you may have. SEEK MEDICAL CARE IF:  Your condition is not improving after your transfusion. You develop redness or irritation at the intravenous (IV) site. SEEK IMMEDIATE MEDICAL CARE IF:  Any of the following symptoms occur over the next 12 hours: Shaking chills. You have a temperature by mouth above 102 F (38.9 C),  not controlled by medicine. Chest, back, or muscle pain. People around you feel you are not acting correctly or are confused. Shortness of breath or difficulty breathing. Dizziness and fainting. You get a rash or develop hives. You have a decrease in urine output. Your urine turns a dark color or changes to pink, red, or brown. Any of the following symptoms occur over the next 10 days: You have a temperature by mouth above 102 F (38.9 C), not controlled by medicine. Shortness of breath. Weakness after normal activity. The white part of the eye turns yellow (jaundice). You have a decrease in the amount of urine or are urinating less often. Your urine turns a dark color or changes to pink, red, or brown. Document Released: 02/11/2000 Document Revised: 05/08/2011 Document Reviewed: 09/30/2007 [facility] Patient Information 2014 [place], [place].  _______________________________________________________________________

## 2021-09-21 NOTE — Care Plan (Signed)
Ortho Bundle Case Management Note  Patient Details  Name: Ariel [name] MRN: [id number] Date of Birth: December 10, 1955  Spoke with patient prior to surgery. She will discharge to her [place] home with her son to assist for the first two weeks. She is a resident at [facility] and will return to her apartment there after her MD follow up. Has equipment at home. OPPT set up with [facility]. Patient and MD in agreement with plan. CHoice offered                      DME Arranged:    DME Agency:     HH Arranged:    HH Agency:     Additional Comments: Please contact me with any questions of if this plan should need to change.  Ladell Heads,  [place] Specialist  [phone] 09/21/2021, 3:17 PM

## 2021-09-21 NOTE — Patient Instructions (Signed)
SURGICAL WAITING ROOM VISITATION Patients having surgery or a procedure may have no more than 2 support people in the waiting area - these visitors may rotate.   Children under the age of 13 must have an adult with them who is not the patient. If the patient needs to stay at the hospital during part of their recovery, the visitor guidelines for inpatient rooms apply. Pre-op nurse will coordinate an appropriate time for 1 support person to accompany patient in pre-op.  This support person may not rotate.    Please refer to the [facility] website for the visitor guidelines for Inpatients (after your surgery is over and you are in a regular room).      Your procedure is scheduled on: 10-11-21   Report to [facility] Main Entrance    Report to admitting at 5:15 AM   Call this number if you have problems the morning of surgery [phone]   Do not eat food :After Midnight.   After Midnight you may have the following liquids until  4:30 AM DAY OF SURGERY  Water Non-Citrus Juices (without pulp, NO RED) Carbonated Beverages Black Coffee (NO MILK/CREAM OR CREAMERS, sugar ok)  Clear Tea (NO MILK/CREAM OR CREAMERS, sugar ok) regular and decaf                             Plain Jell-O (NO RED)                                           Fruit ices (not with fruit pulp, NO RED)                                     Popsicles (NO RED)                                                               Sports drinks like Gatorade (NO RED)                  The day of surgery:  Drink ONE (1) Pre-Surgery Clear Ensure at 4:30 AM the morning of surgery. Drink in one sitting. Do not sip.  This drink was given to you during your hospital  pre-op appointment visit. Nothing else to drink after completing the Pre-Surgery Clear Ensure           If you have questions, please contact your surgeon's office.   FOLLOW ANY ADDITIONAL PRE OP INSTRUCTIONS YOU RECEIVED FROM YOUR SURGEON'S OFFICE!!!     Oral  Hygiene is also important to reduce your risk of infection.                                    Remember - BRUSH YOUR TEETH THE MORNING OF SURGERY WITH YOUR REGULAR TOOTHPASTE   Do NOT smoke after Midnight   Take these medicines the morning of surgery with A SIP OF WATER:  Acyclovir, Tramadol if needed  You may not have any metal on your body including hair pins, jewelry, and body piercing             Do not wear make-up, lotions, powders, perfumes or deodorant  Do not wear nail polish including gel and S&S, artificial/acrylic nails, or any other type of covering on natural nails including finger and toenails. If you have artificial nails, gel coating, etc. that needs to be removed by a nail salon please have this removed prior to surgery or surgery may need to be canceled/ delayed if the surgeon/ anesthesia feels like they are unable to be safely monitored.   Do not shave  48 hours prior to surgery.    Do not bring valuables to the hospital. [place].   Contacts, dentures or bridgework may not be worn into surgery.   Bring small overnight bag day of surgery.   DO NOT [place]. PHARMACY WILL DISPENSE MEDICATIONS LISTED ON YOUR MEDICATION LIST TO YOU DURING YOUR ADMISSION [place]!     Special Instructions: Bring a copy of your healthcare power of attorney and living will documents the day of surgery if you haven't scanned them before.  Please read over the following fact sheets you were given: IF YOU HAVE QUESTIONS ABOUT YOUR PRE-OP INSTRUCTIONS PLEASE CALL [facility] - Preparing for Surgery Before surgery, you can play an important role.  Because skin is not sterile, your skin needs to be as free of germs as possible.  You can reduce the number of germs on your skin by washing with CHG (chlorahexidine gluconate) soap before surgery.  CHG is an antiseptic cleaner  which kills germs and bonds with the skin to continue killing germs even after washing. Please DO NOT use if you have an allergy to CHG or antibacterial soaps.  If your skin becomes reddened/irritated stop using the CHG and inform your nurse when you arrive at Short Stay. Do not shave (including legs and underarms) for at least 48 hours prior to the first CHG shower.  You may shave your face/neck.  Please follow these instructions carefully:  1.  Shower with CHG Soap the night before surgery and the  morning of surgery.  2.  If you choose to wash your hair, wash your hair first as usual with your normal  shampoo.  3.  After you shampoo, rinse your hair and body thoroughly to remove the shampoo.                             4.  Use CHG as you would any other liquid soap.  You can apply chg directly to the skin and wash.  Gently with a scrungie or clean washcloth.  5.  Apply the CHG Soap to your body ONLY FROM THE NECK DOWN.   Do   not use on face/ open                           Wound or open sores. Avoid contact with eyes, ears mouth and   genitals (private parts).                       Wash face,  Genitals (private parts) with your normal soap.             6.  Wash thoroughly, paying special attention to the area where your    surgery  will be performed.  7.  Thoroughly rinse your body with warm water from the neck down.  8.  DO NOT shower/wash with your normal soap after using and rinsing off the CHG Soap.                9.  Pat yourself dry with a clean towel.            10.  Wear clean pajamas.            11.  Place clean sheets on your bed the night of your first shower and do not  sleep with pets. Day of Surgery : Do not apply any lotions/deodorants the morning of surgery.  Please wear clean clothes to the hospital/surgery center.  FAILURE TO FOLLOW THESE INSTRUCTIONS MAY RESULT IN THE CANCELLATION OF YOUR SURGERY  PATIENT SIGNATURE_________________________________  NURSE  SIGNATURE__________________________________  ________________________________________________________________________     Adam Phenix  An incentive spirometer is a tool that can help keep your lungs clear and active. This tool measures how well you are filling your lungs with each breath. Taking long deep breaths may help reverse or decrease the chance of developing breathing (pulmonary) problems (especially infection) following: A long period of time when you are unable to move or be active. BEFORE THE PROCEDURE  If the spirometer includes an indicator to show your best effort, your nurse or respiratory therapist will set it to a desired goal. If possible, sit up straight or lean slightly forward. Try not to slouch. Hold the incentive spirometer in an upright position. INSTRUCTIONS FOR USE  Sit on the edge of your bed if possible, or sit up as far as you can in bed or on a chair. Hold the incentive spirometer in an upright position. Breathe out normally. Place the mouthpiece in your mouth and seal your lips tightly around it. Breathe in slowly and as deeply as possible, raising the piston or the ball toward the top of the column. Hold your breath for 3-5 seconds or for as long as possible. Allow the piston or ball to fall to the bottom of the column. Remove the mouthpiece from your mouth and breathe out normally. Rest for a few seconds and repeat Steps 1 through 7 at least 10 times every 1-2 hours when you are awake. Take your time and take a few normal breaths between deep breaths. The spirometer may include an indicator to show your best effort. Use the indicator as a goal to work toward during each repetition. After each set of 10 deep breaths, practice coughing to be sure your lungs are clear. If you have an incision (the cut made at the time of surgery), support your incision when coughing by placing a pillow or rolled up towels firmly against it. Once you are able to get out  of bed, walk around indoors and cough well. You may stop using the incentive spirometer when instructed by your caregiver.  RISKS AND COMPLICATIONS Take your time so you do not get dizzy or light-headed. If you are in pain, you may need to take or ask for pain medication before doing incentive spirometry. It is harder to take a deep breath if you are having pain. AFTER USE Rest and breathe slowly and easily. It can be helpful to keep track of a log of your progress. Your caregiver can provide you with a simple table to help with this.  If you are using the spirometer at home, follow these instructions: [place] IF:  You are having difficultly using the spirometer. You have trouble using the spirometer as often as instructed. Your pain medication is not giving enough relief while using the spirometer. You develop fever of 100.5 F (38.1 C) or higher. SEEK IMMEDIATE MEDICAL CARE IF:  You cough up bloody sputum that had not been present before. You develop fever of 102 F (38.9 C) or greater. You develop worsening pain at or near the incision site. MAKE SURE YOU:  Understand these instructions. Will watch your condition. Will get help right away if you are not doing well or get worse. Document Released: 06/26/2006 Document Revised: 05/08/2011 Document Reviewed: 08/27/2006 Exit[employer] Patient Information 2014 Exit[employer], [place].   ________________________________________________________________________  WHAT IS A BLOOD TRANSFUSION? Blood Transfusion Information  A transfusion is the replacement of blood or some of its parts. Blood is made up of multiple cells which provide different functions. Red blood cells carry oxygen and are used for blood loss replacement. White blood cells fight against infection. Platelets control bleeding. Plasma helps clot blood. Other blood products are available for specialized needs, such as hemophilia or other clotting disorders. BEFORE THE TRANSFUSION   Who gives blood for transfusions?  Healthy volunteers who are fully evaluated to make sure their blood is safe. This is blood bank blood. Transfusion therapy is the safest it has ever been in the practice of medicine. Before blood is taken from a donor, a complete history is taken to make sure that person has no history of diseases nor engages in risky social behavior (examples are intravenous drug use or sexual activity with multiple partners). The donor's travel history is screened to minimize risk of transmitting infections, such as malaria. The donated blood is tested for signs of infectious diseases, such as HIV and hepatitis. The blood is then tested to be sure it is compatible with you in order to minimize the chance of a transfusion reaction. If you or a relative donates blood, this is often done in anticipation of surgery and is not appropriate for emergency situations. It takes many days to process the donated blood. RISKS AND COMPLICATIONS Although transfusion therapy is very safe and saves many lives, the main dangers of transfusion include:  Getting an infectious disease. Developing a transfusion reaction. This is an allergic reaction to something in the blood you were given. Every precaution is taken to prevent this. The decision to have a blood transfusion has been considered carefully by your caregiver before blood is given. Blood is not given unless the benefits outweigh the risks. AFTER THE TRANSFUSION Right after receiving a blood transfusion, you will usually feel much better and more energetic. This is especially true if your red blood cells have gotten low (anemic). The transfusion raises the level of the red blood cells which carry oxygen, and this usually causes an energy increase. The nurse administering the transfusion will monitor you carefully for complications. HOME CARE INSTRUCTIONS  No special instructions are needed after a transfusion. You may find your energy is  better. Speak with your caregiver about any limitations on activity for underlying diseases you may have. SEEK MEDICAL CARE IF:  Your condition is not improving after your transfusion. You develop redness or irritation at the intravenous (IV) site. SEEK IMMEDIATE MEDICAL CARE IF:  Any of the following symptoms occur over the next 12 hours: Shaking chills. You have a temperature by mouth above 102 F (38.9 C),  not controlled by medicine. Chest, back, or muscle pain. People around you feel you are not acting correctly or are confused. Shortness of breath or difficulty breathing. Dizziness and fainting. You get a rash or develop hives. You have a decrease in urine output. Your urine turns a dark color or changes to pink, red, or brown. Any of the following symptoms occur over the next 10 days: You have a temperature by mouth above 102 F (38.9 C), not controlled by medicine. Shortness of breath. Weakness after normal activity. The white part of the eye turns yellow (jaundice). You have a decrease in the amount of urine or are urinating less often. Your urine turns a dark color or changes to pink, red, or brown. Document Released: 02/11/2000 Document Revised: 05/08/2011 Document Reviewed: 09/30/2007 [facility] Patient Information 2014 [place], [place].  _______________________________________________________________________

## 2021-09-28 ENCOUNTER — Encounter (HOSPITAL_COMMUNITY)
Admission: RE | Admit: 2021-09-28 | Discharge: 2021-09-28 | Disposition: A | Discharge status: Home / Self Care | Payer: Medicare Other | Source: Ambulatory Visit | Attending: Hematology & Oncology | Admitting: Hematology & Oncology

## 2021-09-28 ENCOUNTER — Ambulatory Visit (HOSPITAL_COMMUNITY)
Admission: RE | Admit: 2021-09-28 | Discharge: 2021-09-28 | Disposition: A | Discharge status: Home / Self Care | Payer: Medicare Other | Source: Ambulatory Visit | Attending: Hematology & Oncology | Admitting: Hematology & Oncology

## 2021-09-28 DIAGNOSIS — Z01818 Encounter for other preprocedural examination: Secondary | ICD-10-CM | POA: Diagnosis not present

## 2021-09-28 DIAGNOSIS — C8510 Unspecified B-cell lymphoma, unspecified site: Secondary | ICD-10-CM | POA: Diagnosis not present

## 2021-09-28 DIAGNOSIS — G319 Degenerative disease of nervous system, unspecified: Secondary | ICD-10-CM | POA: Diagnosis not present

## 2021-09-28 LAB — GLUCOSE, CAPILLARY: Glucose-Capillary: 113 mg/dL — ABNORMAL HIGH (ref 70–99)

## 2021-09-28 LAB — SURGICAL PCR SCREEN
MRSA, PCR: NEGATIVE
Staphylococcus aureus: NEGATIVE

## 2021-09-28 MED ORDER — GADOBUTROL 1 MMOL/ML IV SOLN
7.0000 mL | Freq: Once | INTRAVENOUS | Status: AC | PRN
  Administered 2021-09-28: 7 mL via INTRAVENOUS

## 2021-09-28 MED ORDER — FLUDEOXYGLUCOSE F - 18 (FDG) INJECTION
7.7000 | Freq: Once | INTRAVENOUS | Status: AC | PRN
  Administered 2021-09-28: 7.76 via INTRAVENOUS

## 2021-09-29 ENCOUNTER — Encounter (HOSPITAL_COMMUNITY)
Admission: RE | Admit: 2021-09-29 | Discharge: 2021-09-29 | Disposition: A | Discharge status: Home / Self Care | Payer: Medicare Other | Source: Ambulatory Visit | Attending: Orthopaedic Surgery | Admitting: Orthopaedic Surgery

## 2021-09-29 ENCOUNTER — Encounter: Payer: Self-pay | Admitting: Neurology

## 2021-09-29 ENCOUNTER — Other Ambulatory Visit (HOSPITAL_COMMUNITY): Payer: Medicare Other

## 2021-09-29 ENCOUNTER — Encounter (HOSPITAL_COMMUNITY): Payer: Self-pay

## 2021-09-29 DIAGNOSIS — Z01818 Encounter for other preprocedural examination: Secondary | ICD-10-CM

## 2021-09-29 HISTORY — DX: Unspecified osteoarthritis, unspecified site: M19.90

## 2021-09-29 NOTE — Progress Notes (Signed)
   COVID Vaccine Completed:  Yes   Date of COVID positive in last 90 days: no   PCP - [name], NP Cardiologist - Shirlee More, MD Neurologist - Alonza Bogus, DO   Neurology clearance in [place] dated 08-26-21 by Alonza Bogus, DO   Oncology clearance in [place] dated 09-05-21 by Dr. Marin Olp   Chest x-ray - 09-13-21 Epic EKG - 09-13-21 Epic Stress Test - n/a ECHO - 10-07-20 Epic Cardiac Cath - n/a Pacemaker/ICD device last checked: n/a Spinal Cord Stimulator: n/a Telemetry Monitor - 2022 Epic Cardiac CT - 2020 Epic   Bowel Prep - no   Sleep Study - n/a CPAP -    Fasting Blood Sugar - n/a Checks Blood Sugar _____ times a day   Blood Thinner Instructions: n/a Aspirin Instructions: Last Dose:   Activity level:   Can go up a flight of stairs and perform activities of daily living without stopping and without symptoms of chest pain or shortness of breath.                      Anesthesia review:   Followed by cardiology for ?  TIA  (neuro felt symptoms related to non-Hodgkin's lymphoma) and episode of dyspnea on exertion.     Patient denies shortness of breath, fever, cough and chest pain at PAT appointment   Patient verbalized understanding of instructions that were given to them at the PAT appointment. Patient was also instructed that they will need to review over the PAT instructions again at home before surgery.

## 2021-09-30 ENCOUNTER — Other Ambulatory Visit: Payer: Self-pay | Admitting: *Deleted

## 2021-09-30 MED ORDER — TRAMADOL HCL 50 MG PO TABS: ORAL_TABLET | ORAL | 0 refills | Status: DC

## 2021-10-04 ENCOUNTER — Encounter: Payer: Self-pay | Admitting: Hematology & Oncology

## 2021-10-04 ENCOUNTER — Inpatient Hospital Stay: Payer: Medicare Other | Attending: Internal Medicine

## 2021-10-04 ENCOUNTER — Encounter: Payer: Self-pay | Admitting: Family

## 2021-10-04 ENCOUNTER — Encounter: Payer: Self-pay | Admitting: *Deleted

## 2021-10-04 ENCOUNTER — Inpatient Hospital Stay (HOSPITAL_BASED_OUTPATIENT_CLINIC_OR_DEPARTMENT_OTHER): Payer: Medicare Other | Admitting: Hematology & Oncology

## 2021-10-04 ENCOUNTER — Inpatient Hospital Stay: Payer: Medicare Other

## 2021-10-04 VITALS — BP 119/66 | HR 87 | Temp 97.6°F | Resp 18 | Ht 65.0 in | Wt 155.5 lb

## 2021-10-04 DIAGNOSIS — C8510 Unspecified B-cell lymphoma, unspecified site: Secondary | ICD-10-CM

## 2021-10-04 DIAGNOSIS — D509 Iron deficiency anemia, unspecified: Secondary | ICD-10-CM | POA: Diagnosis not present

## 2021-10-04 DIAGNOSIS — C8330 Diffuse large B-cell lymphoma, unspecified site: Secondary | ICD-10-CM | POA: Insufficient documentation

## 2021-10-04 DIAGNOSIS — Z9221 Personal history of antineoplastic chemotherapy: Secondary | ICD-10-CM | POA: Diagnosis not present

## 2021-10-04 LAB — RETICULOCYTES
Immature Retic Fract: 17.1 % — ABNORMAL HIGH (ref 2.3–15.9)
RBC.: 3.23 MIL/uL — ABNORMAL LOW (ref 3.87–5.11)
Retic Count, Absolute: 106.6 10*3/uL (ref 19.0–186.0)
Retic Ct Pct: 3.3 % — ABNORMAL HIGH (ref 0.4–3.1)

## 2021-10-04 LAB — CBC WITH DIFFERENTIAL (CANCER CENTER ONLY)
Abs Immature Granulocytes: 0.04 10*3/uL (ref 0.00–0.07)
Basophils Absolute: 0 10*3/uL (ref 0.0–0.1)
Basophils Relative: 0 %
Eosinophils Absolute: 0.2 10*3/uL (ref 0.0–0.5)
Eosinophils Relative: 2 %
HCT: 33.1 % — ABNORMAL LOW (ref 36.0–46.0)
Hemoglobin: 11 g/dL — ABNORMAL LOW (ref 12.0–15.0)
Immature Granulocytes: 1 %
Lymphocytes Relative: 24 %
Lymphs Abs: 1.6 10*3/uL (ref 0.7–4.0)
MCH: 33.1 pg (ref 26.0–34.0)
MCHC: 33.2 g/dL (ref 30.0–36.0)
MCV: 99.7 fL (ref 80.0–100.0)
Monocytes Absolute: 0.7 10*3/uL (ref 0.1–1.0)
Monocytes Relative: 11 %
Neutro Abs: 4 10*3/uL (ref 1.7–7.7)
Neutrophils Relative %: 62 %
Platelet Count: 127 10*3/uL — ABNORMAL LOW (ref 150–400)
RBC: 3.32 MIL/uL — ABNORMAL LOW (ref 3.87–5.11)
RDW: 17.8 % — ABNORMAL HIGH (ref 11.5–15.5)
WBC Count: 6.5 10*3/uL (ref 4.0–10.5)
nRBC: 0 % (ref 0.0–0.2)

## 2021-10-04 LAB — CMP (CANCER CENTER ONLY)
ALT: 24 U/L (ref 0–44)
AST: 25 U/L (ref 15–41)
Albumin: 4.4 g/dL (ref 3.5–5.0)
Alkaline Phosphatase: 85 U/L (ref 38–126)
Anion gap: 7 (ref 5–15)
BUN: 16 mg/dL (ref 8–23)
CO2: 29 mmol/L (ref 22–32)
Calcium: 10.1 mg/dL (ref 8.9–10.3)
Chloride: 99 mmol/L (ref 98–111)
Creatinine: 0.68 mg/dL (ref 0.44–1.00)
GFR, Estimated: >60 mL/min (ref >60)
Glucose, Bld: 133 mg/dL — ABNORMAL HIGH (ref 70–99)
Potassium: 4.2 mmol/L (ref 3.5–5.1)
Sodium: 135 mmol/L (ref 135–145)
Total Bilirubin: 0.4 mg/dL (ref 0.3–1.2)
Total Protein: 6.4 g/dL — ABNORMAL LOW (ref 6.5–8.1)

## 2021-10-04 LAB — IRON AND IRON BINDING CAPACITY (CC-WL,HP ONLY)
Iron: 61 ug/dL (ref 28–170)
Saturation Ratios: 23 % (ref 10.4–31.8)
TIBC: 260 ug/dL (ref 250–450)
UIBC: 199 ug/dL (ref 148–442)

## 2021-10-04 LAB — FERRITIN: Ferritin: 2660 ng/mL — ABNORMAL HIGH (ref 11–307)

## 2021-10-04 LAB — LACTATE DEHYDROGENASE: LDH: 245 U/L — ABNORMAL HIGH (ref 98–192)

## 2021-10-04 MED ORDER — HEPARIN SOD (PORK) LOCK FLUSH 100 UNIT/ML IV SOLN
500.0000 [IU] | Freq: Once | INTRAVENOUS | Status: AC
  Administered 2021-10-04: 500 [IU] via INTRAVENOUS

## 2021-10-04 MED ORDER — SODIUM CHLORIDE 0.9% FLUSH
10.0000 mL | INTRAVENOUS | Status: AC | PRN
  Administered 2021-10-04: 10 mL via INTRAVENOUS

## 2021-10-04 NOTE — Addendum Note (Signed)
Addended by: Randolm Idol on: 10/04/2021 09:50 AM   Modules accepted: Orders

## 2021-10-04 NOTE — Progress Notes (Signed)
Hematology and Oncology Follow Up Visit  Ariel [name] [id number] Sep 16, 1955 66 y.o. 10/04/2021   Principle Diagnosis:  Diffuse large cell non-Hodgkin's lymphoma-CNS involvement  Current Therapy:   Status post chemotherapy with R-ICE/R-MTV --  s/p cycle #4 MATRIX -- s/p cycle #4 -- start on 05/02/2021 --completed on 08/06/2021 Neulasta 6 mg subcu post chemotherapy     Interim History:  Ariel [name] is comes in for follow-up.  She was very very good.  She is on her hip surgery next Tuesday.  Her blood counts look great today for hip surgery.  What is doing better, is the fact that her PET scan and MRI of the brain both do not show any evidence of recurrent lymphoma.  She really has done nicely.  She really had a tough time with treatment.  She had marked pancytopenia but transfusions really helped.  Thankfully, she had no problem with infections during her treatments.  She is she can go back home after her hip surgery.  She has been living at skilled nursing right now.  She has had no problems with cough or shortness of breath.  She has had no change in bowel or bladder habits.  She has had no nausea or vomiting.  She has had no rashes.  There is been no headache.  She has had no visual changes.  Overall, I would say her performance status is probably ECOG 0.     Medications:  Current Outpatient Medications:    acetaminophen (TYLENOL) 500 MG tablet, Take 500-1,000 mg by mouth every 6 (six) hours as needed (for pain.)., Disp: , Rfl:    acyclovir (ZOVIRAX) 200 MG capsule, TAKE 1 CAPSULE TWICE A DAY., Disp: 60 capsule, Rfl: 2   traMADol (ULTRAM) 50 MG tablet, Take 50 mg by mouth. Every 8 hours as needed for pain, Disp: , Rfl:    antiseptic oral rinse (BIOTENE) LIQD, 15 mLs by Mouth Rinse route every 4 (four) hours. (Patient not taking: Reported on 09/20/2021), Disp: 500 mL, Rfl: 4   docusate sodium (COLACE) 100 MG capsule, Take 100 mg by mouth at bedtime. (Patient not taking: Reported on  10/04/2021), Disp: , Rfl:    Misc Natural Products (PRO NUTRIENTS FRUIT & VEGGIE PO), Take 4 capsules by mouth in the morning. Balance of Nature Fruit and Vegetable Supplements (2) Fruit + (2) Vegetable (Patient not taking: Reported on 10/04/2021), Disp: , Rfl:    Multiple Vitamins-Minerals (MULTIVITAMIN WITH MINERALS) tablet, Take 1 tablet by mouth daily. (Patient not taking: Reported on 10/04/2021), Disp: , Rfl:    Polyethyl Glycol-Propyl Glycol (LUBRICANT EYE DROPS) 0.4-0.3 % SOLN, Place 1-2 drops into both eyes 3 (three) times daily as needed (dry/irritated eyes.). (Patient not taking: Reported on 10/04/2021), Disp: , Rfl:    traMADol (ULTRAM) 50 MG tablet, Take every 8 hrs as needed, Disp: 60 tablet, Rfl: 0  Allergies:  Allergies  Allergen Reactions   Decadron [Dexamethasone] Other (See Comments)    Made the patient's legs tired and she could not walk. Hallucinations   Plavix [Clopidogrel] Hives, Swelling and Other (See Comments)    Lips became swollen    Prednisone Other (See Comments)    Steroids caused psychological issues  Hallucinations   Codeine Nausea And Vomiting    Past Medical History, Surgical history, Social history, and Family History were reviewed and updated.  Review of Systems: Review of Systems  Constitutional: Negative.   HENT:  Negative.    Eyes: Negative.   Respiratory: Negative.    Cardiovascular:  Negative.   Gastrointestinal: Negative.   Endocrine: Negative.   Genitourinary: Negative.    Musculoskeletal: Negative.   Skin: Negative.   Neurological: Negative.   Hematological: Negative.   Psychiatric/Behavioral: Negative.      Physical Exam:  height is '5\' 5"'$  (1.651 m) and weight is 155 lb 8 oz (70.5 kg). Her oral temperature is 97.6 F (36.4 C). Her blood pressure is 119/66 and her pulse is 87. Her respiration is 18 and oxygen saturation is 97%.   Wt Readings from Last 3 Encounters:  10/04/21 155 lb 8 oz (70.5 kg)  09/28/21 153 lb (69.4 kg)  09/13/21 155  lb (70.3 kg)    Physical Exam Vitals reviewed.  HENT:     Head: Normocephalic and atraumatic.     Mouth/Throat:     Comments: Oral exam shows multiple sites of gingival bleeding.  She has gauze in her mouth to try to help with the bleeding.  There is no obvious gingival breakdown. Eyes:     Pupils: Pupils are equal, round, and reactive to light.  Cardiovascular:     Rate and Rhythm: Normal rate and regular rhythm.     Heart sounds: Normal heart sounds.     Comments: Cardiac exam is regular rate and rhythm with no murmurs, rubs or bruits. Pulmonary:     Effort: Pulmonary effort is normal.     Breath sounds: Normal breath sounds.  Abdominal:     General: Bowel sounds are normal.     Palpations: Abdomen is soft.  Musculoskeletal:        General: No tenderness or deformity. Normal range of motion.     Cervical back: Normal range of motion.     Comments: Examination of her left hip shows little bit of tenderness to palpation over the anterior portion of the left hip.  She seems to have decent range of motion.  She has no swelling.  Lymphadenopathy:     Cervical: No cervical adenopathy.  Skin:    General: Skin is warm and dry.     Findings: No erythema or rash.     Comments: Skin exam does show petechia on her lower legs.  Neurological:     Mental Status: She is alert and oriented to person, place, and time.     Comments: Neurological exam shows no focal neurological deficits.  All cranial nerves are intact.  She has good strength in upper and lower extremities.  Psychiatric:        Behavior: Behavior normal.        Thought Content: Thought content normal.        Judgment: Judgment normal.      Lab Results  Component Value Date   WBC 6.5 10/04/2021   HGB 11.0 (L) 10/04/2021   HCT 33.1 (L) 10/04/2021   MCV 99.7 10/04/2021   PLT 127 (L) 10/04/2021     Chemistry      Component Value Date/Time   NA 135 10/04/2021 0830   K 4.2 10/04/2021 0830   CL 99 10/04/2021 0830   CO2  29 10/04/2021 0830   BUN 16 10/04/2021 0830   CREATININE 0.68 10/04/2021 0830   CREATININE 0.68 11/18/2012 1439      Component Value Date/Time   CALCIUM 10.1 10/04/2021 0830   ALKPHOS 85 10/04/2021 0830   AST 25 10/04/2021 0830   ALT 24 10/04/2021 0830   BILITOT 0.4 10/04/2021 0830      Impression and Plan: Ms. Brashear is a very charming  66 year old white female.  She initially came to [place] with iron deficiency anemia.  We subsequently found that she had diffuse large cell non-Hodgkin's lymphoma.  It had traveled to her brain.  She has had a total of 8 cycles of treatment.  She had 4 cycles of a combination protocol.  We then gave her 4 cycles of MATRIX.  Everything looks fantastic.  Again, there is always a risk of recurrence.  I think the risk of recurrence positive greater for the brain and for systemically.  At some point down the road, we will repeat an MRI of the brain to make sure that he is doing all right.  I see no problems with her having hip surgery.  Her blood counts are fine.  I know she is a little anemic.  This however does not really bother me.  I will plan to get her back here in 6 weeks.  She still has a Port-A-Cath that we have to flush.   Volanda Napoleon, MD 8/8/20239:32 AM

## 2021-10-04 NOTE — Patient Instructions (Signed)

## 2021-10-04 NOTE — Progress Notes (Signed)
Reviewed patient scans. She is in radiographic remission. She will have surgery on 10/11/21 for her avascular necrosis.   Oncology Nurse Navigator Documentation     10/04/2021   10:45 AM  Oncology Nurse Navigator Flowsheets  Navigator Follow Up Date: 10/11/2021  Navigator Follow Up Reason: Surgery  Navigator Location [facility]  Navigator Encounter Type Appt/Treatment Plan Review;Scan Review  Patient Visit Type MedOnc  Treatment Phase Active Tx  Barriers/Navigation Needs Coordination of Care;Education  Interventions Psycho-Social Support  Acuity Level 1-No Barriers  Support Groups/Services Friends and Family  Time Spent with Patient 15

## 2021-10-05 NOTE — H&P (Signed)
TOTAL HIP ADMISSION H&P  Patient is admitted for left total hip arthroplasty.  Subjective:  Chief Complaint: left hip pain  HPI: Ariel [name], 66 y.o. female, has a history of pain and functional disability in the left hip(s) due to arthritis and patient has failed non-surgical conservative treatments for greater than 12 weeks to include NSAID's and/or analgesics, corticosteriod injections, flexibility and strengthening excercises, use of assistive devices, weight reduction as appropriate, and activity modification.  Onset of symptoms was gradual starting 5 years ago with gradually worsening course since that time.The patient noted no past surgery on the left hip(s).  Patient currently rates pain in the left hip at 10 out of 10 with activity. Patient has night pain, worsening of pain with activity and weight bearing, trendelenberg gait, pain that interfers with activities of daily living, and crepitus. Patient has evidence of subchondral cysts, subchondral sclerosis, periarticular osteophytes, and joint space narrowing by imaging studies. This condition presents safety issues increasing the risk of falls. There is no current active infection.  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   Diffuse large B cell lymphoma ([place]) 03/07/2021   High grade B-cell lymphoma ([place]) 12/22/2020   Goals of care, counseling/discussion 12/22/2020   Malignant neoplasm metastatic to brain ([place]) 12/09/2020   Brain tumor ([place]) 12/08/2020   Hyponatremia 12/08/2020   Night sweat 36/14/4315   Complication of anesthesia 11/22/2020   PONV (postoperative nausea and vomiting) 11/22/2020   Hyperglycemia 11/10/2020   TIA (transient ischemic attack) 10/07/2020   Iron deficiency anemia 10/07/2020   Melanoma ([place]) 2021    Multinodular goiter 12/22/2013   Subclinical hyperthyroidism 12/19/2013   Urinary incontinence 11/19/2013   Hyperlipidemia 11/19/2012   Routine general medical examination at a health care facility 11/18/2012   Past Medical History:  Diagnosis Date   Arthritis    Chicken pox    Complication of anesthesia    Per patient, very slow to wake up after anesthesia   Goals of care, counseling/discussion 12/22/2020   High cholesterol    High grade B-cell lymphoma ([place]) 12/22/2020   Hyperglycemia 11/10/2020   Melanoma ([place]) 2021   right hip   Melanoma ([place]) 10/28/2020   pt stated brain liver and bladder   PONV (postoperative nausea and vomiting)    Urinary incontinence     Past Surgical History:  Procedure Laterality Date   AIR/FLUID EXCHANGE Right 12/07/2020   Procedure: AIR/FLUID EXCHANGE;  Surgeon: Jalene Mullet, MD;  Location: [facility];  Service: Ophthalmology;  Laterality: Right;   APPENDECTOMY  1962   BIOPSY  12/14/2020   Procedure: BIOPSY;  Surgeon: Rush Landmark Telford Nab., MD;  Location: [facility];  Service: Gastroenterology;;   ESOPHAGOGASTRODUODENOSCOPY (EGD) WITH PROPOFOL N/A 12/14/2020   Procedure: ESOPHAGOGASTRODUODENOSCOPY (EGD) WITH PROPOFOL;  Surgeon: Irving Copas., MD;  Location: [facility];  Service: Gastroenterology;  Laterality: N/A;   EXCISION MELANOMA WITH SENTINEL LYMPH NODE BIOPSY Right 10/09/2019   Procedure: WIDE LOCAL EXCISION WITH ADVANCEMENT FLAP CLOSURE RIGHT HIP MELANOMA WITH SENTINEL LYMPH NODE BIOPSY;  Surgeon: Stark Klein, MD;  Location: [facility];  Service: General;  Laterality: Right;   FINE NEEDLE ASPIRATION  12/14/2020   Procedure: FINE NEEDLE ASPIRATION (FNA) LINEAR;  Surgeon: Irving Copas., MD;  Location: [facility];  Service: Gastroenterology;;   HERNIA REPAIR  2009   IR IMAGING  GUIDED PORT INSERTION  12/30/2020   MOUTH SURGERY  11/2015   gum surgery and bone implant   PARS PLANA VITRECTOMY Right 12/07/2020   Procedure:  PARS PLANA VITRECTOMY WITH 25 GAUGE;  Surgeon: Jalene Mullet, MD;  Location: [facility];  Service: Ophthalmology;  Laterality: Right;   PHOTOCOAGULATION WITH LASER Right 12/07/2020   Procedure: PHOTOCOAGULATION WITH ENDOLASER PANRITINAL COAGULATION;  Surgeon: Jalene Mullet, MD;  Location: [facility];  Service: Ophthalmology;  Laterality: Right;   UPPER ESOPHAGEAL ENDOSCOPIC ULTRASOUND (EUS) N/A 12/14/2020   Procedure: UPPER ESOPHAGEAL ENDOSCOPIC ULTRASOUND (EUS);  Surgeon: Irving Copas., MD;  Location: [facility];  Service: Gastroenterology;  Laterality: N/A;   WISDOM TOOTH EXTRACTION      No current facility-administered medications for this encounter.   Current Outpatient Medications  Medication Sig Dispense Refill Last Dose   acetaminophen (TYLENOL) 500 MG tablet Take 500-1,000 mg by mouth every 6 (six) hours as needed (for pain.).      acyclovir (ZOVIRAX) 200 MG capsule TAKE 1 CAPSULE TWICE A DAY. 60 capsule 2    docusate sodium (COLACE) 100 MG capsule Take 100 mg by mouth at bedtime. (Patient not taking: Reported on 10/04/2021)      Misc Natural Products (PRO NUTRIENTS FRUIT & VEGGIE PO) Take 4 capsules by mouth in the morning. Balance of Nature Fruit and Vegetable Supplements (2) Fruit + (2) Vegetable (Patient not taking: Reported on 10/04/2021)      Polyethyl Glycol-Propyl Glycol (LUBRICANT EYE DROPS) 0.4-0.3 % SOLN Place 1-2 drops into both eyes 3 (three) times daily as needed (dry/irritated eyes.). (Patient not taking: Reported on 10/04/2021)      antiseptic oral rinse (BIOTENE) LIQD 15 mLs by Mouth Rinse route every 4 (four) hours. (Patient not taking: Reported on 09/20/2021) 500 mL 4 Not Taking   Multiple Vitamins-Minerals (MULTIVITAMIN WITH MINERALS) tablet Take 1 tablet by mouth daily. (Patient not taking: Reported on 10/04/2021)   Not Taking   traMADol (ULTRAM) 50 MG tablet Take every 8 hrs as needed 60 tablet 0    traMADol (ULTRAM) 50 MG tablet Take 50 mg by mouth. Every 8 hours as  needed for pain      Facility-Administered Medications Ordered in Other Encounters  Medication Dose Route Frequency Provider Last Rate Last Admin   sodium chloride flush (NS) 0.9 % injection 10 mL  10 mL Intravenous PRN Volanda Napoleon, MD   10 mL at 10/04/21 0946   Allergies  Allergen Reactions   Decadron [Dexamethasone] Other (See Comments)    Made the patient's legs tired and she could not walk. Hallucinations   Plavix [Clopidogrel] Hives, Swelling and Other (See Comments)    Lips became swollen    Prednisone Other (See Comments)    Steroids caused psychological issues  Hallucinations   Codeine Nausea And Vomiting    Social History   Tobacco Use   Smoking status: Never   Smokeless tobacco: Never  Substance Use Topics   Alcohol use: Never    Family History  Problem Relation Age of Onset   Coronary artery disease Father        died at 38   COPD Father    Alcohol abuse Father    Diabetes Mellitus II Father    Coronary artery disease Sister    Cancer Sister        breast   Heart attack Brother    Hyperlipidemia Other        died 90- "natural causes"   Hyperlipidemia Other  4 siblings    Hypertension Other        3 siblings     Review of Systems  Musculoskeletal:  Positive for arthralgias.       Left hip  All other systems reviewed and are negative.   Objective:  Physical Exam Constitutional:      Appearance: Normal appearance.  HENT:     Head: Normocephalic and atraumatic.     Nose: Nose normal.     Mouth/Throat:     Pharynx: Oropharynx is clear.  Eyes:     Extraocular Movements: Extraocular movements intact.  Cardiovascular:     Rate and Rhythm: Normal rate and regular rhythm.     Pulses: Normal pulses.  Pulmonary:     Effort: Pulmonary effort is normal.  Abdominal:     Palpations: Abdomen is soft.  Musculoskeletal:     Cervical back: Normal range of motion.     Comments: Left hip motion is quite limited and painful with internal  rotation.  She is walking with an altered gait using her cane.  Opposite hip moves well.  Sensation and motor function remain intact in her feet with palpable pulses on both sides.   Skin:    General: Skin is warm and dry.  Neurological:     General: No focal deficit present.     Mental Status: She is alert and oriented to person, place, and time.  Psychiatric:        Mood and Affect: Mood normal.        Behavior: Behavior normal.        Thought Content: Thought content normal.        Judgment: Judgment normal.     Vital signs in last 24 hours:    Labs:   Estimated body mass index is 25.88 kg/m as calculated from the following:   Height as of 10/04/21: '5\' 5"'$  (1.651 m).   Weight as of 10/04/21: 70.5 kg.   Imaging Review Plain radiographs demonstrate severe degenerative joint disease of the left hip(s). The bone quality appears to be good for age and reported activity level.   Assessment/Plan:  End stage primary arthritis, left hip(s)  The patient history, physical examination, clinical judgement of the provider and imaging studies are consistent with end stage degenerative joint disease of the left hip(s) and total hip arthroplasty is deemed medically necessary. The treatment options including medical management, injection therapy, arthroscopy and arthroplasty were discussed at length. The risks and benefits of total hip arthroplasty were presented and reviewed. The risks due to aseptic loosening, infection, stiffness, dislocation/subluxation,  thromboembolic complications and other imponderables were discussed.  The patient acknowledged the explanation, agreed to proceed with the plan and consent was signed. Patient is being admitted for inpatient treatment for surgery, pain control, PT, OT, prophylactic antibiotics, VTE prophylaxis, progressive ambulation and ADL's and discharge planning.The patient is planning to be discharged home with home health services

## 2021-10-10 MED ORDER — TRANEXAMIC ACID 1000 MG/10ML IV SOLN
2000.0000 mg | INTRAVENOUS | Status: DC
  Filled 2021-10-10 (×2): qty 20

## 2021-10-11 ENCOUNTER — Encounter (HOSPITAL_COMMUNITY): Payer: Self-pay | Admitting: Orthopaedic Surgery

## 2021-10-11 ENCOUNTER — Encounter (HOSPITAL_COMMUNITY)
Admission: RE | Disposition: A | Discharge status: Home / Self Care | Payer: Self-pay | Source: Ambulatory Visit | Attending: Orthopaedic Surgery

## 2021-10-11 ENCOUNTER — Ambulatory Visit (HOSPITAL_BASED_OUTPATIENT_CLINIC_OR_DEPARTMENT_OTHER): Payer: Medicare Other | Admitting: Anesthesiology

## 2021-10-11 ENCOUNTER — Ambulatory Visit (HOSPITAL_COMMUNITY): Payer: Medicare Other

## 2021-10-11 ENCOUNTER — Other Ambulatory Visit: Payer: Self-pay

## 2021-10-11 ENCOUNTER — Ambulatory Visit (HOSPITAL_COMMUNITY): Payer: Medicare Other | Admitting: Physician Assistant

## 2021-10-11 ENCOUNTER — Encounter: Payer: Self-pay | Admitting: *Deleted

## 2021-10-11 ENCOUNTER — Ambulatory Visit (HOSPITAL_COMMUNITY)
Admission: RE | Admit: 2021-10-11 | Discharge: 2021-10-11 | Disposition: A | Discharge status: Home / Self Care | Payer: Medicare Other | Source: Ambulatory Visit | Attending: Orthopaedic Surgery | Admitting: Orthopaedic Surgery

## 2021-10-11 DIAGNOSIS — Z96642 Presence of left artificial hip joint: Secondary | ICD-10-CM | POA: Diagnosis not present

## 2021-10-11 DIAGNOSIS — Z01818 Encounter for other preprocedural examination: Secondary | ICD-10-CM

## 2021-10-11 DIAGNOSIS — M1612 Unilateral primary osteoarthritis, left hip: Secondary | ICD-10-CM | POA: Diagnosis not present

## 2021-10-11 DIAGNOSIS — M87052 Idiopathic aseptic necrosis of left femur: Secondary | ICD-10-CM | POA: Diagnosis not present

## 2021-10-11 DIAGNOSIS — Z471 Aftercare following joint replacement surgery: Secondary | ICD-10-CM | POA: Diagnosis not present

## 2021-10-11 HISTORY — PX: TOTAL HIP ARTHROPLASTY: SHX124

## 2021-10-11 LAB — TYPE AND SCREEN
ABO/RH(D): A POS
Antibody Screen: NEGATIVE

## 2021-10-11 SURGERY — ARTHROPLASTY, HIP, TOTAL, ANTERIOR APPROACH
Anesthesia: Spinal | Site: Hip | Laterality: Left

## 2021-10-11 MED ORDER — CEFAZOLIN SODIUM-DEXTROSE 2-4 GM/100ML-% IV SOLN: 2.0000 g | Freq: Four times a day (QID) | INTRAVENOUS | Status: DC

## 2021-10-11 MED ORDER — BUPIVACAINE LIPOSOME 1.3 % IJ SUSP: 10.0000 mL | Freq: Once | INTRAMUSCULAR | Status: DC

## 2021-10-11 MED ORDER — LACTATED RINGERS IV SOLN: INTRAVENOUS | Status: DC

## 2021-10-11 MED ORDER — CHLORHEXIDINE GLUCONATE 0.12 % MT SOLN
15.0000 mL | Freq: Once | OROMUCOSAL | Status: AC
  Administered 2021-10-11: 15 mL via OROMUCOSAL

## 2021-10-11 MED ORDER — BUPIVACAINE LIPOSOME 1.3 % IJ SUSP
INTRAMUSCULAR | Status: DC | PRN
  Administered 2021-10-11: 10 mL

## 2021-10-11 MED ORDER — ACETAMINOPHEN 325 MG PO TABS: 325.0000 mg | ORAL_TABLET | Freq: Four times a day (QID) | ORAL | Status: DC | PRN

## 2021-10-11 MED ORDER — BUPIVACAINE-EPINEPHRINE 0.5% -1:200000 IJ SOLN
INTRAMUSCULAR | Status: DC | PRN
  Administered 2021-10-11: 30 mL

## 2021-10-11 MED ORDER — TRANEXAMIC ACID-NACL 1000-0.7 MG/100ML-% IV SOLN
INTRAVENOUS | Status: AC
  Administered 2021-10-11: 1000 mg via INTRAVENOUS
  Filled 2021-10-11: qty 100

## 2021-10-11 MED ORDER — TRANEXAMIC ACID 1000 MG/10ML IV SOLN
INTRAVENOUS | Status: DC | PRN
  Administered 2021-10-11: 2000 mg via TOPICAL

## 2021-10-11 MED ORDER — FENTANYL CITRATE (PF) 100 MCG/2ML IJ SOLN
INTRAMUSCULAR | Status: DC | PRN
  Administered 2021-10-11: 100 ug via INTRAVENOUS

## 2021-10-11 MED ORDER — PROPOFOL 500 MG/50ML IV EMUL
INTRAVENOUS | Status: DC | PRN
  Administered 2021-10-11: 50 ug/kg/min via INTRAVENOUS

## 2021-10-11 MED ORDER — HYDROMORPHONE HCL 1 MG/ML IJ SOLN: 0.2500 mg | INTRAMUSCULAR | Status: DC | PRN

## 2021-10-11 MED ORDER — EPHEDRINE 5 MG/ML INJ
INTRAVENOUS | Status: AC
  Filled 2021-10-11: qty 5

## 2021-10-11 MED ORDER — BUPIVACAINE-EPINEPHRINE (PF) 0.5% -1:200000 IJ SOLN
INTRAMUSCULAR | Status: AC
  Filled 2021-10-11: qty 30

## 2021-10-11 MED ORDER — POVIDONE-IODINE 10 % EX SWAB
2.0000 | Freq: Once | CUTANEOUS | Status: AC
  Administered 2021-10-11: 2 via TOPICAL

## 2021-10-11 MED ORDER — ONDANSETRON HCL 4 MG/2ML IJ SOLN
INTRAMUSCULAR | Status: AC
  Filled 2021-10-11: qty 2

## 2021-10-11 MED ORDER — BUPIVACAINE IN DEXTROSE 0.75-8.25 % IT SOLN
INTRATHECAL | Status: DC | PRN
  Administered 2021-10-11: 1.6 mL via INTRATHECAL

## 2021-10-11 MED ORDER — LACTATED RINGERS IV BOLUS
500.0000 mL | Freq: Once | INTRAVENOUS | Status: AC
  Administered 2021-10-11: 500 mL via INTRAVENOUS

## 2021-10-11 MED ORDER — ACETAMINOPHEN 500 MG PO TABS
ORAL_TABLET | ORAL | Status: AC
  Filled 2021-10-11: qty 2

## 2021-10-11 MED ORDER — LACTATED RINGERS IV BOLUS
250.0000 mL | Freq: Once | INTRAVENOUS | Status: AC
  Administered 2021-10-11: 250 mL via INTRAVENOUS

## 2021-10-11 MED ORDER — MIDAZOLAM HCL 5 MG/5ML IJ SOLN
INTRAMUSCULAR | Status: DC | PRN
  Administered 2021-10-11: 2 mg via INTRAVENOUS

## 2021-10-11 MED ORDER — OXYCODONE HCL 5 MG/5ML PO SOLN: 5.0000 mg | Freq: Once | ORAL | Status: DC | PRN

## 2021-10-11 MED ORDER — OXYCODONE HCL 5 MG PO TABS: 5.0000 mg | ORAL_TABLET | Freq: Once | ORAL | Status: DC | PRN

## 2021-10-11 MED ORDER — MIDAZOLAM HCL 2 MG/2ML IJ SOLN
INTRAMUSCULAR | Status: AC
  Filled 2021-10-11: qty 2

## 2021-10-11 MED ORDER — ONDANSETRON HCL 4 MG/2ML IJ SOLN
INTRAMUSCULAR | Status: DC | PRN
  Administered 2021-10-11: 4 mg via INTRAVENOUS

## 2021-10-11 MED ORDER — PROPOFOL 1000 MG/100ML IV EMUL
INTRAVENOUS | Status: AC
  Filled 2021-10-11: qty 100

## 2021-10-11 MED ORDER — CEFAZOLIN SODIUM-DEXTROSE 2-4 GM/100ML-% IV SOLN
2.0000 g | INTRAVENOUS | Status: AC
  Administered 2021-10-11: 2 g via INTRAVENOUS
  Filled 2021-10-11: qty 100

## 2021-10-11 MED ORDER — TRANEXAMIC ACID-NACL 1000-0.7 MG/100ML-% IV SOLN: 1000.0000 mg | Freq: Once | INTRAVENOUS | Status: AC

## 2021-10-11 MED ORDER — TRANEXAMIC ACID-NACL 1000-0.7 MG/100ML-% IV SOLN
1000.0000 mg | INTRAVENOUS | Status: AC
  Administered 2021-10-11: 1000 mg via INTRAVENOUS
  Filled 2021-10-11: qty 100

## 2021-10-11 MED ORDER — BUPIVACAINE LIPOSOME 1.3 % IJ SUSP
INTRAMUSCULAR | Status: AC
  Filled 2021-10-11: qty 10

## 2021-10-11 MED ORDER — CEFAZOLIN SODIUM-DEXTROSE 2-4 GM/100ML-% IV SOLN
INTRAVENOUS | Status: AC
  Administered 2021-10-11: 2 g via INTRAVENOUS
  Filled 2021-10-11: qty 100

## 2021-10-11 MED ORDER — PHENYLEPHRINE HCL-NACL 20-0.9 MG/250ML-% IV SOLN
INTRAVENOUS | Status: DC | PRN
  Administered 2021-10-11: 50 ug/min via INTRAVENOUS

## 2021-10-11 MED ORDER — PROPOFOL 10 MG/ML IV BOLUS
INTRAVENOUS | Status: AC
  Filled 2021-10-11: qty 20

## 2021-10-11 MED ORDER — FENTANYL CITRATE (PF) 100 MCG/2ML IJ SOLN
INTRAMUSCULAR | Status: AC
  Filled 2021-10-11: qty 2

## 2021-10-11 MED ORDER — EPHEDRINE SULFATE-NACL 50-0.9 MG/10ML-% IV SOSY
PREFILLED_SYRINGE | INTRAVENOUS | Status: DC | PRN
  Administered 2021-10-11: 5 mg via INTRAVENOUS

## 2021-10-11 MED ORDER — TIZANIDINE HCL 4 MG PO TABS
4.0000 mg | ORAL_TABLET | Freq: Four times a day (QID) | ORAL | 1 refills | Status: DC | PRN
Start: 2021-10-11 — End: 2022-01-02

## 2021-10-11 MED ORDER — PROPOFOL 10 MG/ML IV BOLUS
INTRAVENOUS | Status: DC | PRN
  Administered 2021-10-11: 20 mg via INTRAVENOUS

## 2021-10-11 MED ORDER — LIDOCAINE HCL (PF) 2 % IJ SOLN
INTRAMUSCULAR | Status: AC
  Filled 2021-10-11: qty 5

## 2021-10-11 MED ORDER — ACETAMINOPHEN 500 MG PO TABS
500.0000 mg | ORAL_TABLET | Freq: Four times a day (QID) | ORAL | Status: DC
  Administered 2021-10-11: 500 mg via ORAL

## 2021-10-11 MED ORDER — 0.9 % SODIUM CHLORIDE (POUR BTL) OPTIME
TOPICAL | Status: DC | PRN
  Administered 2021-10-11: 1000 mL

## 2021-10-11 MED ORDER — ORAL CARE MOUTH RINSE: 15.0000 mL | Freq: Once | OROMUCOSAL | Status: AC

## 2021-10-11 MED ORDER — ASPIRIN 81 MG PO TBEC: 81.0000 mg | DELAYED_RELEASE_TABLET | Freq: Two times a day (BID) | ORAL | 0 refills | Status: DC

## 2021-10-11 MED ORDER — PROMETHAZINE HCL 25 MG/ML IJ SOLN: 6.2500 mg | INTRAMUSCULAR | Status: DC | PRN

## 2021-10-11 MED ORDER — HYDROCODONE-ACETAMINOPHEN 5-325 MG PO TABS: 1.0000 | ORAL_TABLET | Freq: Four times a day (QID) | ORAL | 0 refills | Status: DC | PRN

## 2021-10-11 MED ORDER — PHENYLEPHRINE HCL (PRESSORS) 10 MG/ML IV SOLN
INTRAVENOUS | Status: AC
Start: 2021-10-11 — End: ?
  Filled 2021-10-11: qty 1

## 2021-10-11 MED ORDER — PHENYLEPHRINE HCL (PRESSORS) 10 MG/ML IV SOLN
INTRAVENOUS | Status: AC
  Filled 2021-10-11: qty 1

## 2021-10-11 SURGICAL SUPPLY — 46 items
BAG COUNTER SPONGE SURGICOUNT (BAG) ×1 IMPLANT
BAG DECANTER FOR FLEXI CONT (MISCELLANEOUS) ×2 IMPLANT
BLADE SAW SGTL 18X1.27X75 (BLADE) ×2 IMPLANT
BOOTIES KNEE HIGH SLOAN (MISCELLANEOUS) ×2 IMPLANT
CELLS DAT CNTRL 66122 CELL SVR (MISCELLANEOUS) ×1 IMPLANT
COVER PERINEAL POST (MISCELLANEOUS) ×2 IMPLANT
COVER SURGICAL LIGHT HANDLE (MISCELLANEOUS) ×2 IMPLANT
DRAPE FOOT SWITCH (DRAPES) ×2 IMPLANT
DRAPE IMP U-DRAPE 54X76 (DRAPES) ×2 IMPLANT
DRAPE STERI IOBAN 125X83 (DRAPES) ×2 IMPLANT
DRAPE U-SHAPE 47X51 STRL (DRAPES) ×4 IMPLANT
DRSG AQUACEL AG ADV 3.5X 6 (GAUZE/BANDAGES/DRESSINGS) ×2 IMPLANT
DURAPREP 26ML APPLICATOR (WOUND CARE) ×2 IMPLANT
ELECT BLADE TIP CTD 4 INCH (ELECTRODE) ×2 IMPLANT
ELECT REM PT RETURN 15FT ADLT (MISCELLANEOUS) ×2 IMPLANT
ELIMINATOR HOLE APEX DEPUY (Hips) ×1 IMPLANT
GLOVE BIO SURGEON STRL SZ8 (GLOVE) ×4 IMPLANT
GLOVE BIOGEL PI IND STRL 8 (GLOVE) ×2 IMPLANT
GLOVE BIOGEL PI INDICATOR 8 (GLOVE) ×2
GOWN STRL REUS W/ TWL XL LVL3 (GOWN DISPOSABLE) ×2 IMPLANT
GOWN STRL REUS W/TWL XL LVL3 (GOWN DISPOSABLE) ×2
HEAD CERAMIC 36 PLUS5 (Hips) ×1 IMPLANT
HOLDER FOLEY CATH W/STRAP (MISCELLANEOUS) ×2 IMPLANT
KIT TURNOVER KIT A (KITS) ×1 IMPLANT
LINER NEUTRAL 52X36MM PLUS 4 (Liner) ×1 IMPLANT
LINER PINN ACET GRIP 50X100 (Liner) ×1 IMPLANT
MANIFOLD NEPTUNE II (INSTRUMENTS) ×2 IMPLANT
NEEDLE HYPO 22GX1.5 SAFETY (NEEDLE) ×2 IMPLANT
NS IRRIG 1000ML POUR BTL (IV SOLUTION) ×2 IMPLANT
PACK ANTERIOR HIP CUSTOM (KITS) ×2 IMPLANT
PENCIL SMOKE EVACUATOR (MISCELLANEOUS) IMPLANT
PIN SECTOR W/GRIP ACE CUP 52MM (Hips) ×1 IMPLANT
PROTECTOR NERVE ULNAR (MISCELLANEOUS) ×2 IMPLANT
RETRACTOR WND ALEXIS 18 MED (MISCELLANEOUS) ×1 IMPLANT
RTRCTR WOUND ALEXIS 18CM MED (MISCELLANEOUS) ×2
SCREW PINN CAN BONE 6.5MMX15MM (Screw) ×1 IMPLANT
SPIKE FLUID TRANSFER (MISCELLANEOUS) ×2 IMPLANT
STEM FEM ACTIS STD SZ2 (Stem) ×1 IMPLANT
SUT ETHIBOND NAB CT1 #1 30IN (SUTURE) ×4 IMPLANT
SUT VIC AB 1 CT1 36 (SUTURE) ×2 IMPLANT
SUT VIC AB 2-0 CT1 27 (SUTURE) ×1
SUT VIC AB 2-0 CT1 TAPERPNT 27 (SUTURE) ×1 IMPLANT
SUT VICRYL AB 3-0 FS1 BRD 27IN (SUTURE) ×2 IMPLANT
SUT VLOC 180 0 24IN GS25 (SUTURE) ×2 IMPLANT
SYR 50ML LL SCALE MARK (SYRINGE) ×2 IMPLANT
TRAY FOLEY MTR SLVR 16FR STAT (SET/KITS/TRAYS/PACK) ×2 IMPLANT

## 2021-10-11 NOTE — Evaluation (Signed)
Physical Therapy Evaluation Patient Details Name: Ariel [name] MRN: [id number] DOB: 10-30-1955 Today's Date: 10/11/2021  History of Present Illness  Pt is a 66yo female presenting s/p L-THA, AA on 10/11/21. PMH: hx of diffuse large-cell non-Hodgkin's lymphoma with CNS involvement, hx of melanoma, Pancytopenia, hx of TIA 2022.   Clinical Impression  Staphanie [name] is a 66 y.o. female POD 0 s/p L-THA, AA. Patient reports modified independence using SPC and RW for mobility at baseline. Pt's HR elevated throughout session, HR132 in supine during interview with BP 120/67. Patient is now limited by functional impairments (see PT problem list below) and requires min guard for transfers and gait with RW. Patient was able to ambulate 80 feet with RW and min guard and cues for safe walker management. Patient educated on safe sequencing for stair mobility and verbalized safe guarding position for people assisting with mobility. Patient instructed in exercises to facilitate ROM and circulation. Patient will benefit from continued skilled PT interventions to address impairments and progress towards PLOF. Patient has met mobility goals at adequate level for discharge home; will continue to follow if pt continues acute stay to progress towards Mod I goals.        Recommendations for follow up therapy are one component of a multi-disciplinary discharge planning process, led by the attending physician.  Recommendations may be updated based on patient status, additional functional criteria and insurance authorization.  Follow Up Recommendations Follow physician's recommendations for discharge plan and follow up therapies      Assistance Recommended at Discharge Intermittent Supervision/Assistance  Patient can return home with the following  A little help with walking and/or transfers;A little help with bathing/dressing/bathroom;Assistance with cooking/housework;Assist for transportation;Help with stairs or ramp for  entrance    Equipment Recommendations None recommended by PT  Recommendations for Other Services       Functional Status Assessment Patient has had a recent decline in their functional status and demonstrates the ability to make significant improvements in function in a reasonable and predictable amount of time.     Precautions / Restrictions Precautions Precautions: Fall Restrictions Weight Bearing Restrictions: No Other Position/Activity Restrictions: WBAT      Mobility  Bed Mobility Overal bed mobility: Modified Independent             General bed mobility comments: increased time    Transfers Overall transfer level: Needs assistance Equipment used: Rolling walker (2 wheels) Transfers: Sit to/from Stand Sit to Stand: From elevated surface, Min guard           General transfer comment: for safety only, no physical assist provided    Ambulation/Gait Ambulation/Gait assistance: Min guard Gait Distance (Feet): 80 Feet Assistive device: Rolling walker (2 wheels) Gait Pattern/deviations: Step-to pattern Gait velocity: decreased     General Gait Details: Pt ambulated with RW and min guard, no physical assist required or overt LOB noted.  Stairs            Wheelchair Mobility    Modified Rankin (Stroke Patients Only)       Balance Overall balance assessment: Needs assistance Sitting-balance support: Feet supported, No upper extremity supported Sitting balance-Leahy Scale: Good     Standing balance support: Reliant on assistive device for balance, During functional activity, Bilateral upper extremity supported Standing balance-Leahy Scale: Poor                               Pertinent Vitals/Pain Pain Assessment Pain  Assessment: No/denies pain    Home Living Family/patient expects to be discharged to:: Private residence Living Arrangements: Alone Available Help at Discharge: Family;Available 24 hours/day Type of Home:  House Home Access: Stairs to enter Entrance Stairs-Rails: Right Entrance Stairs-Number of Steps: 1   Home Layout: One level Home Equipment: Shower seat;Grab bars - tub/shower;Grab bars - toilet;BSC/3in1;Toilet riser;Rolling Walker (2 wheels);Cane - single point;Rollator (4 wheels)      Prior Function Prior Level of Function : Independent/Modified Independent;Driving             Mobility Comments: Uses RW at night, and SPC most of the times ADLs Comments: IND     Hand Dominance   Dominant Hand: Right    Extremity/Trunk Assessment   Upper Extremity Assessment Upper Extremity Assessment: Overall WFL for tasks assessed    Lower Extremity Assessment Lower Extremity Assessment: RLE deficits/detail;LLE deficits/detail RLE Deficits / Details: MMT ank DF/PF 5/5 RLE Sensation: WNL LLE Deficits / Details: MMT ank DF/PF 5/5 LLE Sensation: WNL    Cervical / Trunk Assessment Cervical / Trunk Assessment: Kyphotic  Communication   Communication: No difficulties  Cognition Arousal/Alertness: Awake/alert Behavior During Therapy: WFL for tasks assessed/performed Overall Cognitive Status: Within Functional Limits for tasks assessed                                          General Comments General comments (skin integrity, edema, etc.): Pt HR elevated, 132 at rest during interview, BP 120/67 son [place] present    Exercises Total Joint Exercises Ankle Circles/Pumps: AROM, Both, 10 reps Quad Sets: AROM, Left, 5 reps Short Arc Quad: AROM, Left, 5 reps Heel Slides: AAROM, Left, 5 reps Hip ABduction/ADduction: AAROM, Left, 5 reps Long Arc Quad: Other (comment) (educated not performed) Marching in Standing: Other (comment) (educated not performed) Standing Hip Extension: Other (comment) (educated not performed)   Assessment/Plan    PT Assessment Patient needs continued PT services  PT Problem List Decreased strength;Decreased range of motion;Decreased activity  tolerance;Decreased balance;Decreased mobility;Decreased coordination;Pain       PT Treatment Interventions DME instruction;Gait training;Stair training;Functional mobility training;Therapeutic activities;Therapeutic exercise;Balance training;Neuromuscular re-education;Patient/family education    PT Goals (Current goals can be found in the Care Plan section)  Acute Rehab PT Goals Patient Stated Goal: To walk without pain PT Goal Formulation: With patient Time For Goal Achievement: 10/18/21 Potential to Achieve Goals: Good    Frequency 7X/week     Co-evaluation               AM-PAC PT "6 Clicks" Mobility  Outcome Measure Help needed turning from your back to your side while in a flat bed without using bedrails?: None Help needed moving from lying on your back to sitting on the side of a flat bed without using bedrails?: A Little Help needed moving to and from a bed to a chair (including a wheelchair)?: A Little Help needed standing up from a chair using your arms (e.g., wheelchair or bedside chair)?: A Little Help needed to walk in hospital room?: A Little Help needed climbing 3-5 steps with a railing? : A Little 6 Click Score: 19    End of Session Equipment Utilized During Treatment: Gait belt Activity Tolerance: Patient tolerated treatment well;No increased pain Patient left: in chair;with call bell/phone within reach Nurse Communication: Mobility status PT Visit Diagnosis: Pain;Difficulty in walking, not elsewhere classified (R26.2) Pain - Right/Left:  Left Pain - part of body: Hip    Time: 8871-9597 PT Time Calculation (min) (ACUTE ONLY): 43 min   Charges:   PT Evaluation $PT Eval Low Complexity: 1 Low PT Treatments $Gait Training: 8-22 mins $Therapeutic Exercise: 8-22 mins        Coolidge Breeze, PT, DPT [facility] Rehabilitation Department Office: [phone] Pager: [phone]  Coolidge Breeze 10/11/2021, 3:20 PM

## 2021-10-11 NOTE — Progress Notes (Signed)
Patient admitted today for hip replacement surgery. Follow up in our office already scheduled for 11/15/21.  Oncology Nurse Navigator Documentation     10/11/2021    8:45 AM  Oncology Nurse Navigator Flowsheets  Navigator Follow Up Date: 11/15/2021  Navigator Follow Up Reason: Follow-up Appointment  Navigator Location [facility]  Navigator Encounter Type Appt/Treatment Plan Review;Scan Review  Patient Visit Type MedOnc  Treatment Phase Post-Tx Follow-up  Barriers/Navigation Needs No Barriers At This Time  Interventions None Required  Acuity Level 1-No Barriers  Support Groups/Services Friends and Family  Time Spent with Patient 15

## 2021-10-11 NOTE — Anesthesia Procedure Notes (Signed)
Spinal  Patient location during procedure: OR Start time: 10/11/2021 7:29 AM End time: 10/11/2021 7:34 AM Reason for block: surgical anesthesia Staffing Performed: resident/CRNA  Resident/CRNA: Sharlette Dense, CRNA Performed by: Sharlette Dense, CRNA Authorized by: Lynda Rainwater, MD   Preanesthetic Checklist Completed: patient identified, IV checked, site marked, risks and benefits discussed, surgical consent, monitors and equipment checked, pre-op evaluation and timeout performed Spinal Block Patient position: sitting Prep: DuraPrep and site prepped and draped Patient monitoring: heart rate, continuous pulse ox and blood pressure Approach: midline Location: L3-4 Injection technique: single-shot Needle Needle type: Sprotte and Pencan  Needle gauge: 24 G Needle length: 10 cm Assessment Sensory level: T4 Events: CSF return Additional Notes Kit expiration date 08/27/2023 and lot # [id number] Clear free flow of CSF, negative heme, negative paresthesia Tolerated well and placed in supine position

## 2021-10-11 NOTE — Op Note (Signed)
PRE-OP DIAGNOSIS:  LEFT HIP DEGENERATIVE JOINT DISEASE POST-OP DIAGNOSIS: same PROCEDURE:  LEFT TOTAL HIP ARTHROPLASTY ANTERIOR APPROACH ANESTHESIA:  Spinal and MAC SURGEON:  Melrose Nakayama MD ASSISTANT:  Loni Dolly PA-C   INDICATIONS FOR PROCEDURE:  The patient is a 66 y.o. female with a long history of a painful hip.  This has persisted despite multiple conservative measures.  The patient has persisted with pain and dysfunction making rest and activity difficult.  A total hip replacement is offered as surgical treatment.  Informed operative consent was obtained after discussion of possible complications including reaction to anesthesia, infection, neurovascular injury, dislocation, DVT, PE, and death.  The importance of the postoperative rehab program to optimize result was stressed with the patient.  SUMMARY OF FINDINGS AND PROCEDURE:  Under the above anesthesia through a anterior approach an the Hana table a left THR was performed.  The patient had severe degenerative change and poor bone quality.  We used DePuy components to replace the hip and these were size 2 Actis femur capped with a +5 41m ceramic hip ball.  On the acetabular side we used a size 52 shell with a plus 4 neutral polyethylene liner.  I placed one screw for added fixation as her bone quality was poor. We did use a hole eliminator.  ALoni DollyPA-C assisted throughout and was invaluable to the completion of the case in that he helped position and retract while I performed the procedure.  He also closed simultaneously to help minimize OR time.  I used fluoroscopy throughout the case to check position of components and leg lengths and read all these views myself.  DESCRIPTION OF PROCEDURE:  The patient was taken to the OR suite where the above anesthetic was applied.  The patient was then positioned on the Hana table supine.  All bony prominences were appropriately padded.  Prep and drape was then performed in normal sterile  fashion.  The patient was given kefzol preoperative antibiotic and an appropriate time out was performed.  We then took an anterior approach to the left hip.  Dissection was taken through adipose to the tensor fascia lata fascia.  This structure was incised longitudinally and we dissected in the intermuscular interval just medial to this muscle.  Cobra retractors were placed superior and inferior to the femoral neck superficial to the capsule.  A capsular incision was then made and the retractors were placed along the femoral neck.  Xray was brought in to get a good level for the femoral neck cut which was made with an oscillating saw and osteotome.  The femoral head was removed with a corkscrew.  The acetabulum was exposed and some labral tissues were excised. Reaming was taken to the inside wall of the pelvis and sequentially up to 1 mm smaller than the actual component.  A trial of components was done and then the aforementioned acetabular shell was placed in appropriate tilt and anteversion confirmed by fluoroscopy. The liner was placed along with the hole eliminator and attention was turned to the femur.  The leg was brought down and over into adduction and the elevator bar was used to raise the femur up gently in the wound.  The piriformis was released with care taken to preserve the obturator internus attachment and all of the posterior capsule. The femur was reamed and then broached to the appropriate size.  A trial reduction was done and the aforementioned head and neck assembly gave u[place]oreathe best stability in extension with external  rotation.  Leg lengths were felt to be about equal by fluoroscopic exam.  The trial components were removed and the wound irrigated.  We then placed the femoral component in appropriate anteversion.  The head was applied to a dry stem neck and the hip again reduced.  It was again stable in the aforementioned position.  The would was irrigated again followed by re-approximation of  anterior capsule with ethibond suture. Tensor fascia was repaired with V-loc suture  followed by deep closure with #O and #2 undyed vicryl.  Skin was closed with subQ stitch and steristrips followed by a sterile dressing.  EBL and IOF can be obtained from anesthesia records.  DISPOSITION:  The patient was taken to PACU to potentially go home same day depending on ability to walk and tolerate liquids.

## 2021-10-11 NOTE — Interval H&P Note (Signed)
History and Physical Interval Note:  10/11/2021 7:20 AM  Ariel [name]  has presented today for surgery, with the diagnosis of LEFT HIP DEGENERATIVE JOINT DISEASE.  The various methods of treatment have been discussed with the patient and family. After consideration of risks, benefits and other options for treatment, the patient has consented to  Procedure(s): LEFT TOTAL HIP ARTHROPLASTY ANTERIOR APPROACH (Left) as a surgical intervention.  The patient's history has been reviewed, patient examined, no change in status, stable for surgery.  I have reviewed the patient's chart and labs.  Questions were answered to the patient's satisfaction.     Hessie Dibble

## 2021-10-11 NOTE — Anesthesia Procedure Notes (Signed)
Date/Time: 10/11/2021 7:26 AM  Performed by: Sharlette Dense, CRNAOxygen Delivery Method: Simple face mask

## 2021-10-11 NOTE — Transfer of Care (Signed)
Immediate Anesthesia Transfer of Care Note  Patient: Ariel [name]  Procedure(s) Performed: LEFT TOTAL HIP ARTHROPLASTY ANTERIOR APPROACH (Left: Hip)  Patient Location: PACU  Anesthesia Type:Spinal  Level of Consciousness: awake, alert  and oriented  Airway & Oxygen Therapy: Patient Spontanous Breathing and Patient connected to face mask oxygen  Post-op Assessment: Report given to RN and Post -op Vital signs reviewed and stable  Post vital signs: Reviewed and stable  Last Vitals:  Vitals Value Taken Time  BP 109/55 10/11/21 0919  Temp    Pulse 98 10/11/21 0920  Resp 23 10/11/21 0920  SpO2 100 % 10/11/21 0920  Vitals shown include unvalidated device data.  Last Pain:  Vitals:   10/11/21 0558  TempSrc:   PainSc: 3          Complications: No notable events documented.

## 2021-10-11 NOTE — Anesthesia Preprocedure Evaluation (Signed)
Anesthesia Evaluation  Patient identified by MRN, date of birth, ID band Patient awake    Reviewed: Allergy & Precautions, NPO status , Patient's Chart, lab work & pertinent test results  History of Anesthesia Complications (+) PONV and history of anesthetic complications  Airway Mallampati: I  TM Distance: >3 FB Neck ROM: Full    Dental  (+) Dental Advisory Given, Teeth Intact   Pulmonary neg pulmonary ROS, neg shortness of breath, neg sleep apnea, neg COPD, neg recent URI,    breath sounds clear to auscultation       Cardiovascular negative cardio ROS   Rhythm:Regular     Neuro/Psych TIAnegative psych ROS   GI/Hepatic negative GI ROS, Neg liver ROS,   Endo/Other  negative endocrine ROS  Renal/GU negative Renal ROS     Musculoskeletal  (+) Arthritis , Osteoarthritis,    Abdominal   Peds  Hematology  (+) Blood dyscrasia, anemia , Lab Results      Component                Value               Date                      WBC                      11.4 (H)            12/13/2020                HGB                      10.6 (L)            12/13/2020                HCT                      35.1 (L)            12/13/2020                MCV                      76.8 (L)            12/13/2020                PLT                      400                 12/13/2020              Anesthesia Other Findings Lymphadenopathy  Reproductive/Obstetrics                             Anesthesia Physical  Anesthesia Plan  ASA: 2  Anesthesia Plan: Spinal   Post-op Pain Management: Minimal or no pain anticipated   Induction:   PONV Risk Score and Plan: 3 and Propofol infusion and Treatment may vary due to age or medical condition  Airway Management Planned: Nasal Cannula  Additional Equipment: None  Intra-op Plan:   Post-operative Plan:   Informed Consent: I have reviewed the patients History and  Physical, chart, labs and discussed the procedure including the risks, benefits and alternatives for  the proposed anesthesia with the patient or authorized representative who has indicated his/her understanding and acceptance.     Dental advisory given  Plan Discussed with: CRNA and Anesthesiologist  Anesthesia Plan Comments:         Anesthesia Quick Evaluation

## 2021-10-11 NOTE — Anesthesia Postprocedure Evaluation (Signed)
Anesthesia Post Note  Patient: Ariel [name]  Procedure(s) Performed: LEFT TOTAL HIP ARTHROPLASTY ANTERIOR APPROACH (Left: Hip)     Patient location during evaluation: PACU Anesthesia Type: Spinal Level of consciousness: awake and alert Pain management: pain level controlled Vital Signs Assessment: post-procedure vital signs reviewed and stable Respiratory status: spontaneous breathing, nonlabored ventilation and respiratory function stable Cardiovascular status: blood pressure returned to baseline and stable Postop Assessment: no apparent nausea or vomiting Anesthetic complications: no   No notable events documented.  Last Vitals:  Vitals:   10/11/21 0945 10/11/21 1000  BP: (!) 113/54 112/73  Pulse: 92 100  Resp: 15 17  Temp:    SpO2: 94% 94%    Last Pain:  Vitals:   10/11/21 1000  TempSrc:   PainSc: 0-No pain                 Lynda Rainwater

## 2021-10-12 ENCOUNTER — Encounter (HOSPITAL_COMMUNITY): Payer: Self-pay | Admitting: Orthopaedic Surgery

## 2021-10-13 DIAGNOSIS — Z96642 Presence of left artificial hip joint: Secondary | ICD-10-CM | POA: Diagnosis not present

## 2021-10-13 DIAGNOSIS — M25652 Stiffness of left hip, not elsewhere classified: Secondary | ICD-10-CM | POA: Diagnosis not present

## 2021-10-13 DIAGNOSIS — M6281 Muscle weakness (generalized): Secondary | ICD-10-CM | POA: Diagnosis not present

## 2021-10-17 ENCOUNTER — Telehealth: Payer: Self-pay | Admitting: *Deleted

## 2021-10-17 NOTE — Telephone Encounter (Signed)
Message received from patient wanting to know if she should continue Acyclovir.  Call placed back to patient and patient notified per order of Dr. Marin Olp to stop Acyclovir.  Pt is appreciative of call and has no further questions at this time.

## 2021-10-19 DIAGNOSIS — M25652 Stiffness of left hip, not elsewhere classified: Secondary | ICD-10-CM | POA: Diagnosis not present

## 2021-10-19 DIAGNOSIS — Z96642 Presence of left artificial hip joint: Secondary | ICD-10-CM | POA: Diagnosis not present

## 2021-10-19 DIAGNOSIS — M6281 Muscle weakness (generalized): Secondary | ICD-10-CM | POA: Diagnosis not present

## 2021-10-20 ENCOUNTER — Telehealth: Payer: Self-pay | Admitting: *Deleted

## 2021-10-20 DIAGNOSIS — D696 Thrombocytopenia, unspecified: Secondary | ICD-10-CM

## 2021-10-20 DIAGNOSIS — Z95828 Presence of other vascular implants and grafts: Secondary | ICD-10-CM

## 2021-10-20 DIAGNOSIS — C8510 Unspecified B-cell lymphoma, unspecified site: Secondary | ICD-10-CM

## 2021-10-20 DIAGNOSIS — D649 Anemia, unspecified: Secondary | ICD-10-CM

## 2021-10-20 NOTE — Telephone Encounter (Signed)
Message received from patient stating that she is feeling tired and fatigued and would like to know if she can come in for blood work. Dr. Marin Olp notified and would like for pt to come in tomorrow for labs only.  Pt informed and states that she can be here tomorrow at 11:15AM.  Message sent to scheduling.

## 2021-10-21 ENCOUNTER — Inpatient Hospital Stay: Payer: Medicare Other

## 2021-10-21 ENCOUNTER — Telehealth: Payer: Self-pay | Admitting: *Deleted

## 2021-10-21 ENCOUNTER — Telehealth (HOSPITAL_BASED_OUTPATIENT_CLINIC_OR_DEPARTMENT_OTHER): Payer: Self-pay

## 2021-10-21 DIAGNOSIS — C8330 Diffuse large B-cell lymphoma, unspecified site: Secondary | ICD-10-CM | POA: Diagnosis not present

## 2021-10-21 DIAGNOSIS — C8510 Unspecified B-cell lymphoma, unspecified site: Secondary | ICD-10-CM

## 2021-10-21 DIAGNOSIS — D649 Anemia, unspecified: Secondary | ICD-10-CM

## 2021-10-21 DIAGNOSIS — Z95828 Presence of other vascular implants and grafts: Secondary | ICD-10-CM

## 2021-10-21 DIAGNOSIS — M6281 Muscle weakness (generalized): Secondary | ICD-10-CM | POA: Diagnosis not present

## 2021-10-21 DIAGNOSIS — D509 Iron deficiency anemia, unspecified: Secondary | ICD-10-CM | POA: Diagnosis not present

## 2021-10-21 DIAGNOSIS — Z96642 Presence of left artificial hip joint: Secondary | ICD-10-CM | POA: Diagnosis not present

## 2021-10-21 DIAGNOSIS — Z9221 Personal history of antineoplastic chemotherapy: Secondary | ICD-10-CM | POA: Diagnosis not present

## 2021-10-21 DIAGNOSIS — Z471 Aftercare following joint replacement surgery: Secondary | ICD-10-CM | POA: Diagnosis not present

## 2021-10-21 DIAGNOSIS — D696 Thrombocytopenia, unspecified: Secondary | ICD-10-CM

## 2021-10-21 DIAGNOSIS — M25652 Stiffness of left hip, not elsewhere classified: Secondary | ICD-10-CM | POA: Diagnosis not present

## 2021-10-21 LAB — SAMPLE TO BLOOD BANK

## 2021-10-21 LAB — CBC WITH DIFFERENTIAL (CANCER CENTER ONLY)
Abs Immature Granulocytes: 0.02 10*3/uL (ref 0.00–0.07)
Basophils Absolute: 0 10*3/uL (ref 0.0–0.1)
Basophils Relative: 0 %
Eosinophils Absolute: 0 10*3/uL (ref 0.0–0.5)
Eosinophils Relative: 1 %
HCT: 31.3 % — ABNORMAL LOW (ref 36.0–46.0)
Hemoglobin: 10 g/dL — ABNORMAL LOW (ref 12.0–15.0)
Immature Granulocytes: 1 %
Lymphocytes Relative: 24 %
Lymphs Abs: 0.9 10*3/uL (ref 0.7–4.0)
MCH: 32.8 pg (ref 26.0–34.0)
MCHC: 31.9 g/dL (ref 30.0–36.0)
MCV: 102.6 fL — ABNORMAL HIGH (ref 80.0–100.0)
Monocytes Absolute: 0.4 10*3/uL (ref 0.1–1.0)
Monocytes Relative: 11 %
Neutro Abs: 2.4 10*3/uL (ref 1.7–7.7)
Neutrophils Relative %: 63 %
Platelet Count: 90 10*3/uL — ABNORMAL LOW (ref 150–400)
RBC: 3.05 MIL/uL — ABNORMAL LOW (ref 3.87–5.11)
RDW: 15.7 % — ABNORMAL HIGH (ref 11.5–15.5)
WBC Count: 3.8 10*3/uL — ABNORMAL LOW (ref 4.0–10.5)
nRBC: 0 % (ref 0.0–0.2)

## 2021-10-21 LAB — CMP (CANCER CENTER ONLY)
ALT: 17 U/L (ref 0–44)
AST: 25 U/L (ref 15–41)
Albumin: 4.3 g/dL (ref 3.5–5.0)
Alkaline Phosphatase: 107 U/L (ref 38–126)
Anion gap: 8 (ref 5–15)
BUN: 18 mg/dL (ref 8–23)
CO2: 29 mmol/L (ref 22–32)
Calcium: 10.3 mg/dL (ref 8.9–10.3)
Chloride: 99 mmol/L (ref 98–111)
Creatinine: 0.68 mg/dL (ref 0.44–1.00)
GFR, Estimated: >60 mL/min (ref >60)
Glucose, Bld: 116 mg/dL — ABNORMAL HIGH (ref 70–99)
Potassium: 4.5 mmol/L (ref 3.5–5.1)
Sodium: 136 mmol/L (ref 135–145)
Total Bilirubin: 0.4 mg/dL (ref 0.3–1.2)
Total Protein: 7 g/dL (ref 6.5–8.1)

## 2021-10-21 LAB — IRON AND IRON BINDING CAPACITY (CC-WL,HP ONLY)
Iron: 55 ug/dL (ref 28–170)
Saturation Ratios: 22 % (ref 10.4–31.8)
TIBC: 253 ug/dL (ref 250–450)
UIBC: 198 ug/dL (ref 148–442)

## 2021-10-21 NOTE — Telephone Encounter (Signed)
Call placed to patient to inform her that CBC and CMET results are OK per order of Dr. Marin Olp.  Informed pt that iron results would be back by Monday and we would look for those results. Pt is appreciative of call and has no questions at this time.

## 2021-10-21 NOTE — Telephone Encounter (Signed)
Patient notified per order of Dr. Marin Olp that iron studies are WNL.  Pt is appreciative of call and has no questions or concerns at this time.

## 2021-10-22 LAB — FERRITIN: Ferritin: 3346 ng/mL — ABNORMAL HIGH (ref 11–307)

## 2021-10-25 DIAGNOSIS — M25652 Stiffness of left hip, not elsewhere classified: Secondary | ICD-10-CM | POA: Diagnosis not present

## 2021-10-25 DIAGNOSIS — Z96642 Presence of left artificial hip joint: Secondary | ICD-10-CM | POA: Diagnosis not present

## 2021-10-25 DIAGNOSIS — M6281 Muscle weakness (generalized): Secondary | ICD-10-CM | POA: Diagnosis not present

## 2021-10-27 DIAGNOSIS — Z96642 Presence of left artificial hip joint: Secondary | ICD-10-CM | POA: Diagnosis not present

## 2021-10-27 DIAGNOSIS — M6281 Muscle weakness (generalized): Secondary | ICD-10-CM | POA: Diagnosis not present

## 2021-10-27 DIAGNOSIS — M25652 Stiffness of left hip, not elsewhere classified: Secondary | ICD-10-CM | POA: Diagnosis not present

## 2021-11-01 DIAGNOSIS — Z96642 Presence of left artificial hip joint: Secondary | ICD-10-CM | POA: Diagnosis not present

## 2021-11-01 DIAGNOSIS — M25652 Stiffness of left hip, not elsewhere classified: Secondary | ICD-10-CM | POA: Diagnosis not present

## 2021-11-01 DIAGNOSIS — M6281 Muscle weakness (generalized): Secondary | ICD-10-CM | POA: Diagnosis not present

## 2021-11-03 DIAGNOSIS — Z96642 Presence of left artificial hip joint: Secondary | ICD-10-CM | POA: Diagnosis not present

## 2021-11-03 DIAGNOSIS — M6281 Muscle weakness (generalized): Secondary | ICD-10-CM | POA: Diagnosis not present

## 2021-11-03 DIAGNOSIS — M25652 Stiffness of left hip, not elsewhere classified: Secondary | ICD-10-CM | POA: Diagnosis not present

## 2021-11-07 DIAGNOSIS — M6281 Muscle weakness (generalized): Secondary | ICD-10-CM | POA: Diagnosis not present

## 2021-11-07 DIAGNOSIS — Z96642 Presence of left artificial hip joint: Secondary | ICD-10-CM | POA: Diagnosis not present

## 2021-11-07 DIAGNOSIS — M25652 Stiffness of left hip, not elsewhere classified: Secondary | ICD-10-CM | POA: Diagnosis not present

## 2021-11-09 DIAGNOSIS — M25652 Stiffness of left hip, not elsewhere classified: Secondary | ICD-10-CM | POA: Diagnosis not present

## 2021-11-09 DIAGNOSIS — Z96642 Presence of left artificial hip joint: Secondary | ICD-10-CM | POA: Diagnosis not present

## 2021-11-09 DIAGNOSIS — M6281 Muscle weakness (generalized): Secondary | ICD-10-CM | POA: Diagnosis not present

## 2021-11-09 DIAGNOSIS — M1611 Unilateral primary osteoarthritis, right hip: Secondary | ICD-10-CM | POA: Diagnosis not present

## 2021-11-15 ENCOUNTER — Inpatient Hospital Stay: Payer: Medicare Other

## 2021-11-15 ENCOUNTER — Inpatient Hospital Stay (HOSPITAL_BASED_OUTPATIENT_CLINIC_OR_DEPARTMENT_OTHER): Payer: Medicare Other | Admitting: Hematology & Oncology

## 2021-11-15 ENCOUNTER — Encounter: Payer: Self-pay | Admitting: *Deleted

## 2021-11-15 ENCOUNTER — Encounter: Payer: Self-pay | Admitting: Hematology & Oncology

## 2021-11-15 ENCOUNTER — Inpatient Hospital Stay: Payer: Medicare Other | Attending: Internal Medicine

## 2021-11-15 VITALS — BP 121/61 | HR 76 | Temp 98.2°F | Resp 18 | Ht 66.5 in | Wt 154.5 lb

## 2021-11-15 DIAGNOSIS — Z79899 Other long term (current) drug therapy: Secondary | ICD-10-CM | POA: Diagnosis not present

## 2021-11-15 DIAGNOSIS — C8330 Diffuse large B-cell lymphoma, unspecified site: Secondary | ICD-10-CM | POA: Diagnosis not present

## 2021-11-15 DIAGNOSIS — M25652 Stiffness of left hip, not elsewhere classified: Secondary | ICD-10-CM | POA: Diagnosis not present

## 2021-11-15 DIAGNOSIS — M6281 Muscle weakness (generalized): Secondary | ICD-10-CM | POA: Diagnosis not present

## 2021-11-15 DIAGNOSIS — C8510 Unspecified B-cell lymphoma, unspecified site: Secondary | ICD-10-CM | POA: Diagnosis not present

## 2021-11-15 DIAGNOSIS — D509 Iron deficiency anemia, unspecified: Secondary | ICD-10-CM | POA: Diagnosis not present

## 2021-11-15 DIAGNOSIS — Z96642 Presence of left artificial hip joint: Secondary | ICD-10-CM | POA: Diagnosis not present

## 2021-11-15 DIAGNOSIS — Z9221 Personal history of antineoplastic chemotherapy: Secondary | ICD-10-CM | POA: Diagnosis not present

## 2021-11-15 LAB — IRON AND IRON BINDING CAPACITY (CC-WL,HP ONLY)
Iron: 85 ug/dL (ref 28–170)
Saturation Ratios: 32 % — ABNORMAL HIGH (ref 10.4–31.8)
TIBC: 266 ug/dL (ref 250–450)
UIBC: 181 ug/dL (ref 148–442)

## 2021-11-15 LAB — CMP (CANCER CENTER ONLY)
ALT: 37 U/L (ref 0–44)
AST: 34 U/L (ref 15–41)
Albumin: 4.1 g/dL (ref 3.5–5.0)
Alkaline Phosphatase: 74 U/L (ref 38–126)
Anion gap: 8 (ref 5–15)
BUN: 16 mg/dL (ref 8–23)
CO2: 30 mmol/L (ref 22–32)
Calcium: 9.7 mg/dL (ref 8.9–10.3)
Chloride: 98 mmol/L (ref 98–111)
Creatinine: 0.61 mg/dL (ref 0.44–1.00)
GFR, Estimated: >60 mL/min (ref >60)
Glucose, Bld: 112 mg/dL — ABNORMAL HIGH (ref 70–99)
Potassium: 4.1 mmol/L (ref 3.5–5.1)
Sodium: 136 mmol/L (ref 135–145)
Total Bilirubin: 0.3 mg/dL (ref 0.3–1.2)
Total Protein: 6.5 g/dL (ref 6.5–8.1)

## 2021-11-15 LAB — CBC WITH DIFFERENTIAL (CANCER CENTER ONLY)
Abs Immature Granulocytes: 0.06 10*3/uL (ref 0.00–0.07)
Basophils Absolute: 0 10*3/uL (ref 0.0–0.1)
Basophils Relative: 0 %
Eosinophils Absolute: 0 10*3/uL (ref 0.0–0.5)
Eosinophils Relative: 0 %
HCT: 34.7 % — ABNORMAL LOW (ref 36.0–46.0)
Hemoglobin: 11.4 g/dL — ABNORMAL LOW (ref 12.0–15.0)
Immature Granulocytes: 1 %
Lymphocytes Relative: 29 %
Lymphs Abs: 1.3 10*3/uL (ref 0.7–4.0)
MCH: 33.4 pg (ref 26.0–34.0)
MCHC: 32.9 g/dL (ref 30.0–36.0)
MCV: 101.8 fL — ABNORMAL HIGH (ref 80.0–100.0)
Monocytes Absolute: 0.8 10*3/uL (ref 0.1–1.0)
Monocytes Relative: 18 %
Neutro Abs: 2.3 10*3/uL (ref 1.7–7.7)
Neutrophils Relative %: 52 %
Platelet Count: 94 10*3/uL — ABNORMAL LOW (ref 150–400)
RBC: 3.41 MIL/uL — ABNORMAL LOW (ref 3.87–5.11)
RDW: 13.6 % (ref 11.5–15.5)
WBC Count: 4.5 10*3/uL (ref 4.0–10.5)
nRBC: 0 % (ref 0.0–0.2)

## 2021-11-15 LAB — LACTATE DEHYDROGENASE: LDH: 237 U/L — ABNORMAL HIGH (ref 98–192)

## 2021-11-15 LAB — FERRITIN: Ferritin: 1918 ng/mL — ABNORMAL HIGH (ref 11–307)

## 2021-11-15 LAB — RETICULOCYTES
Immature Retic Fract: 7.7 % (ref 2.3–15.9)
RBC.: 3.36 MIL/uL — ABNORMAL LOW (ref 3.87–5.11)
Retic Count, Absolute: 79 10*3/uL (ref 19.0–186.0)
Retic Ct Pct: 2.4 % (ref 0.4–3.1)

## 2021-11-15 NOTE — Progress Notes (Signed)
Patient is doing well after her hip surgery. She is now back home after being in a SNF while undergoing treatment. She will need a PET and MRI prior to her next appointment. Will schedule those closer to that time, and once authorization is obtained.   Oncology Nurse Navigator Documentation     11/15/2021   10:30 AM  Oncology Nurse Navigator Flowsheets  Navigator Follow Up Date: 12/28/2021  Navigator Follow Up Reason: Radiology  Navigator Location [facility]  Navigator Encounter Type Appt/Treatment Plan Review  Patient Visit Type MedOnc  Treatment Phase Post-Tx Follow-up  Barriers/Navigation Needs No Barriers At This Time  Interventions None Required  Acuity Level 1-No Barriers  Support Groups/Services Friends and Family  Time Spent with Patient 15

## 2021-11-15 NOTE — Progress Notes (Signed)
Hematology and Oncology Follow Up Visit  Ariel [name] [id number] 07/31/1955 66 y.o. 11/15/2021   Principle Diagnosis:  Diffuse large cell non-Hodgkin's lymphoma-CNS involvement  Current Therapy:   Status post chemotherapy with R-ICE/R-MTV --  s/p cycle #4 MATRIX -- s/p cycle #4 -- start on 05/02/2021 --completed on 08/06/2021 Neulasta 6 mg subcu post chemotherapy     Interim History:  Ariel [name] is comes in for follow-up.  She did have her hip surgery for the left hip.  This Went very well.  I think she had this in August.  She is doing well.  She does have some physical therapy for this.    This weekend, [place].  Her daughter and husband came up from [place].  There will be a period for a few weeks.  She has had no problem with headaches.  There is no blurred vision.  She has had no cough or shortness of breath.  She is now back home.  She really enjoys being home again.  She was at a skilled nursing facility for several months while she was taking chemotherapy for her lymphoma.  She has had no change in bowel or bladder habits.  Overall, I would say performance status is probably ECOG 1.  .     Medications:  Current Outpatient Medications:    aspirin EC 81 MG tablet, Take 1 tablet (81 mg total) by mouth 2 (two) times daily after a meal. FOR 2 WEEKS THEN ONCE A DAY FOR 2 WEEKS FOR DVT PREVENTION., Disp: 45 tablet, Rfl: 0   docusate sodium (COLACE) 100 MG capsule, Take 100 mg by mouth at bedtime., Disp: , Rfl:    Misc Natural Products (PRO NUTRIENTS FRUIT & VEGGIE PO), Take 4 capsules by mouth in the morning. Balance of Nature Fruit and Vegetable Supplements (2) Fruit + (2) Vegetable, Disp: , Rfl:    acetaminophen (TYLENOL) 500 MG tablet, Take 500-1,000 mg by mouth every 6 (six) hours as needed (for pain.)., Disp: , Rfl:    antiseptic oral rinse (BIOTENE) LIQD, 15 mLs by Mouth Rinse route every 4 (four) hours., Disp: 500 mL, Rfl: 4    HYDROcodone-acetaminophen (NORCO/VICODIN) 5-325 MG tablet, Take 1-2 tablets by mouth every 6 (six) hours as needed for moderate pain or severe pain (post op pain)., Disp: 40 tablet, Rfl: 0   Multiple Vitamins-Minerals (MULTIVITAMIN WITH MINERALS) tablet, Take 1 tablet by mouth daily., Disp: , Rfl:    Polyethyl Glycol-Propyl Glycol (LUBRICANT EYE DROPS) 0.4-0.3 % SOLN, Place 1-2 drops into both eyes 3 (three) times daily as needed (dry/irritated eyes.)., Disp: , Rfl:    tiZANidine (ZANAFLEX) 4 MG tablet, Take 1 tablet (4 mg total) by mouth every 6 (six) hours as needed for muscle spasms., Disp: 40 tablet, Rfl: 1 No current facility-administered medications for this visit.  Facility-Administered Medications Ordered in Other Visits:    sodium chloride flush (NS) 0.9 % injection 10 mL, 10 mL, Intravenous, PRN, Ariel Napoleon, MD, 10 mL at 10/04/21 0946  Allergies:  Allergies  Allergen Reactions   Decadron [Dexamethasone] Other (See Comments)    Made the patient's legs tired and she could not walk. Hallucinations   Plavix [Clopidogrel] Hives, Swelling and Other (See Comments)    Lips became swollen    Prednisone Other (See Comments)    Steroids caused psychological issues  Hallucinations   Codeine Nausea And Vomiting    Past Medical History, Surgical history, Social history, and Family History were  reviewed and updated.  Review of Systems: Review of Systems  Constitutional: Negative.   HENT:  Negative.    Eyes: Negative.   Respiratory: Negative.    Cardiovascular: Negative.   Gastrointestinal: Negative.   Endocrine: Negative.   Genitourinary: Negative.    Musculoskeletal: Negative.   Skin: Negative.   Neurological: Negative.   Hematological: Negative.   Psychiatric/Behavioral: Negative.      Physical Exam:  height is 5' 6.5" (1.689 m) and weight is 154 lb 8 oz (70.1 kg). Her oral temperature is 98.2 F (36.8 C). Her blood pressure is 121/61 and her pulse is 76. Her  respiration is 18 and oxygen saturation is 97%.   Wt Readings from Last 3 Encounters:  11/15/21 154 lb 8 oz (70.1 kg)  10/11/21 154 lb 5.2 oz (70 kg)  10/04/21 155 lb 8 oz (70.5 kg)    Physical Exam Vitals reviewed.  HENT:     Head: Normocephalic and atraumatic.     Mouth/Throat:     Comments: Oral exam shows multiple sites of gingival bleeding.  She has gauze in her mouth to try to help with the bleeding.  There is no obvious gingival breakdown. Eyes:     Pupils: Pupils are equal, round, and reactive to light.  Cardiovascular:     Rate and Rhythm: Normal rate and regular rhythm.     Heart sounds: Normal heart sounds.     Comments: Cardiac exam is regular rate and rhythm with no murmurs, rubs or bruits. Pulmonary:     Effort: Pulmonary effort is normal.     Breath sounds: Normal breath sounds.  Abdominal:     General: Bowel sounds are normal.     Palpations: Abdomen is soft.  Musculoskeletal:        General: No tenderness or deformity. Normal range of motion.     Cervical back: Normal range of motion.     Comments: Examination of her left hip shows little bit of tenderness to palpation over the anterior portion of the left hip.  She seems to have decent range of motion.  She has no swelling.  Lymphadenopathy:     Cervical: No cervical adenopathy.  Skin:    General: Skin is warm and dry.     Findings: No erythema or rash.     Comments: Skin exam does show petechia on her lower legs.  Neurological:     Mental Status: She is alert and oriented to person, place, and time.     Comments: Neurological exam shows no focal neurological deficits.  All cranial nerves are intact.  She has good strength in upper and lower extremities.  Psychiatric:        Behavior: Behavior normal.        Thought Content: Thought content normal.        Judgment: Judgment normal.      Lab Results  Component Value Date   WBC 3.8 (L) 10/21/2021   HGB 10.0 (L) 10/21/2021   HCT 31.3 (L) 10/21/2021    MCV 102.6 (H) 10/21/2021   PLT 90 (L) 10/21/2021     Chemistry      Component Value Date/Time   NA 136 10/21/2021 1104   K 4.5 10/21/2021 1104   CL 99 10/21/2021 1104   CO2 29 10/21/2021 1104   BUN 18 10/21/2021 1104   CREATININE 0.68 10/21/2021 1104   CREATININE 0.68 11/18/2012 1439      Component Value Date/Time   CALCIUM 10.3 10/21/2021 1104  ALKPHOS 107 10/21/2021 1104   AST 25 10/21/2021 1104   ALT 17 10/21/2021 1104   BILITOT 0.4 10/21/2021 1104      Impression and Plan: Ms. Papaleo is a very charming 66 year old white female.  She initially came to [place] with iron deficiency anemia.  We subsequently found that she had diffuse large cell non-Hodgkin's lymphoma.  It had traveled to her brain.  She has had a total of 8 cycles of treatment.  She had 4 cycles of a combination protocol.  We then gave her 4 cycles of MATRIX.  Everything looks fantastic.  Again, there is always a risk of recurrence.  I think the risk of recurrence is probably greater for the brain then it is  systemically.    I will repeat a PET scan and a MRI of the brain probably in early December.  I am happy that the anemia seems to be doing a little bit better.  Patient still has a Port-A-Cath in which we flush every 6 weeks.  We will get her back in December after she has her scans done.   Ariel Napoleon, MD 9/19/20239:00 AM

## 2021-11-15 NOTE — Patient Instructions (Signed)

## 2021-11-17 DIAGNOSIS — M25652 Stiffness of left hip, not elsewhere classified: Secondary | ICD-10-CM | POA: Diagnosis not present

## 2021-11-17 DIAGNOSIS — Z96642 Presence of left artificial hip joint: Secondary | ICD-10-CM | POA: Diagnosis not present

## 2021-11-17 DIAGNOSIS — M6281 Muscle weakness (generalized): Secondary | ICD-10-CM | POA: Diagnosis not present

## 2021-11-23 ENCOUNTER — Telehealth: Payer: Self-pay | Admitting: *Deleted

## 2021-11-23 NOTE — Telephone Encounter (Signed)
Call received from patient stating that she is out of town and that her dentist is calling her in an antibiotic for a tooth abscess. She would like to know if it is ok with Dr Marin Olp for her to take the antibiotic.  Pt notified that Dr. Marin Olp is ok with her taking an antibiotic for her tooth abscess.  Pt is appreciative of assistance and has no further questions at this time.

## 2021-11-29 DIAGNOSIS — M25652 Stiffness of left hip, not elsewhere classified: Secondary | ICD-10-CM | POA: Diagnosis not present

## 2021-11-29 DIAGNOSIS — Z96642 Presence of left artificial hip joint: Secondary | ICD-10-CM | POA: Diagnosis not present

## 2021-12-01 DIAGNOSIS — Z96642 Presence of left artificial hip joint: Secondary | ICD-10-CM | POA: Diagnosis not present

## 2021-12-01 DIAGNOSIS — M25652 Stiffness of left hip, not elsewhere classified: Secondary | ICD-10-CM | POA: Diagnosis not present

## 2021-12-01 DIAGNOSIS — M6281 Muscle weakness (generalized): Secondary | ICD-10-CM | POA: Diagnosis not present

## 2021-12-08 ENCOUNTER — Encounter: Payer: Self-pay | Admitting: Hematology & Oncology

## 2021-12-08 DIAGNOSIS — M25551 Pain in right hip: Secondary | ICD-10-CM | POA: Diagnosis not present

## 2021-12-12 DIAGNOSIS — M25551 Pain in right hip: Secondary | ICD-10-CM | POA: Diagnosis not present

## 2021-12-13 ENCOUNTER — Telehealth: Payer: Self-pay | Admitting: Family

## 2021-12-13 NOTE — Telephone Encounter (Signed)
Patient called to let Melissa know that she will be needing another hip surgery and [facility] will be faxing forms for her to fill out. She wants to make sure there is no appt needed and that forms can be faxed back fast. Please advise.

## 2021-12-14 DIAGNOSIS — H35453 Secondary pigmentary degeneration, bilateral: Secondary | ICD-10-CM | POA: Diagnosis not present

## 2021-12-14 DIAGNOSIS — H43312 Vitreous membranes and strands, left eye: Secondary | ICD-10-CM | POA: Diagnosis not present

## 2021-12-14 DIAGNOSIS — H35363 Drusen (degenerative) of macula, bilateral: Secondary | ICD-10-CM | POA: Diagnosis not present

## 2021-12-14 DIAGNOSIS — H25813 Combined forms of age-related cataract, bilateral: Secondary | ICD-10-CM | POA: Diagnosis not present

## 2021-12-14 DIAGNOSIS — H353121 Nonexudative age-related macular degeneration, left eye, early dry stage: Secondary | ICD-10-CM | POA: Diagnosis not present

## 2021-12-14 DIAGNOSIS — H43392 Other vitreous opacities, left eye: Secondary | ICD-10-CM | POA: Diagnosis not present

## 2021-12-14 DIAGNOSIS — H353112 Nonexudative age-related macular degeneration, right eye, intermediate dry stage: Secondary | ICD-10-CM | POA: Diagnosis not present

## 2021-12-14 NOTE — Telephone Encounter (Signed)
Patient advised form has been received and will let her know if appointment needed once provider reviews

## 2021-12-19 ENCOUNTER — Ambulatory Visit (HOSPITAL_BASED_OUTPATIENT_CLINIC_OR_DEPARTMENT_OTHER)
Admission: RE | Admit: 2021-12-19 | Discharge: 2021-12-19 | Disposition: A | Discharge status: Home / Self Care | Payer: Medicare Other | Source: Ambulatory Visit | Attending: Family | Admitting: Family

## 2021-12-19 ENCOUNTER — Ambulatory Visit (INDEPENDENT_AMBULATORY_CARE_PROVIDER_SITE_OTHER): Payer: Medicare Other | Admitting: Family

## 2021-12-19 ENCOUNTER — Encounter: Payer: Self-pay | Admitting: Neurology

## 2021-12-19 VITALS — BP 120/64 | HR 71 | Temp 98.0°F | Resp 16 | Ht 66.0 in | Wt 156.0 lb

## 2021-12-19 DIAGNOSIS — C8510 Unspecified B-cell lymphoma, unspecified site: Secondary | ICD-10-CM | POA: Insufficient documentation

## 2021-12-19 DIAGNOSIS — Z01818 Encounter for other preprocedural examination: Secondary | ICD-10-CM

## 2021-12-19 LAB — CBC WITH DIFFERENTIAL/PLATELET
Basophils Absolute: 0 10*3/uL (ref 0.0–0.1)
Basophils Relative: 0.2 % (ref 0.0–3.0)
Eosinophils Absolute: 0.1 10*3/uL (ref 0.0–0.7)
Eosinophils Relative: 2.3 % (ref 0.0–5.0)
HCT: 37.5 % (ref 36.0–46.0)
Hemoglobin: 12.7 g/dL (ref 12.0–15.0)
Lymphocytes Relative: 46.4 % — ABNORMAL HIGH (ref 12.0–46.0)
Lymphs Abs: 1.4 10*3/uL (ref 0.7–4.0)
MCHC: 33.8 g/dL (ref 30.0–36.0)
MCV: 97.9 fl (ref 78.0–100.0)
Monocytes Absolute: 0.7 10*3/uL (ref 0.1–1.0)
Monocytes Relative: 23.2 % — ABNORMAL HIGH (ref 3.0–12.0)
Neutro Abs: 0.9 10*3/uL — ABNORMAL LOW (ref 1.4–7.7)
Neutrophils Relative %: 27.9 % — ABNORMAL LOW (ref 43.0–77.0)
Platelets: 120 10*3/uL — ABNORMAL LOW (ref 150.0–400.0)
RBC: 3.83 Mil/uL — ABNORMAL LOW (ref 3.87–5.11)
RDW: 12.7 % (ref 11.5–15.5)
WBC: 3.1 10*3/uL — ABNORMAL LOW (ref 4.0–10.5)

## 2021-12-19 LAB — COMPREHENSIVE METABOLIC PANEL
ALT: 22 U/L (ref 0–35)
AST: 22 U/L (ref 0–37)
Albumin: 4.5 g/dL (ref 3.5–5.2)
Alkaline Phosphatase: 69 U/L (ref 39–117)
BUN: 12 mg/dL (ref 6–23)
CO2: 32 mEq/L (ref 19–32)
Calcium: 10.2 mg/dL (ref 8.4–10.5)
Chloride: 99 mEq/L (ref 96–112)
Creatinine, Ser: 0.62 mg/dL (ref 0.40–1.20)
GFR: 91.98 mL/min (ref >60.00)
Glucose, Bld: 97 mg/dL (ref 70–99)
Potassium: 4.4 mEq/L (ref 3.5–5.1)
Sodium: 137 mEq/L (ref 135–145)
Total Bilirubin: 0.4 mg/dL (ref 0.2–1.2)
Total Protein: 6.4 g/dL (ref 6.0–8.3)

## 2021-12-19 LAB — PROTIME-INR
INR: 0.9 ratio (ref 0.8–1.0)
Prothrombin Time: 10.1 s (ref 9.6–13.1)

## 2021-12-19 NOTE — Addendum Note (Signed)
Addended by: Kelle Darting A on: 12/19/2021 10:27 AM   Modules accepted: Orders

## 2021-12-19 NOTE — Assessment & Plan Note (Signed)
EKG tracing is personally reviewed.  EKG notes NSR.  No acute changes. Labs and CXR as ordered. Surgical clearance pending review of these results.

## 2021-12-19 NOTE — Assessment & Plan Note (Signed)
In remission- has follow up with Dr. Marin Olp scheduled.

## 2021-12-19 NOTE — Progress Notes (Addendum)
Subjective:   By signing my name below, I, Shehryar Baig, attest that this documentation has been prepared under the direction and in the presence of [name], NP 12/19/2021    Patient ID: Ariel [name], female    DOB: May 09, 1955, 66 y.o.   MRN: [id number]  Chief Complaint  Patient presents with   Pre-op Exam    HPI Patient is in today for a surgical clearance visit.   Surgical clearance- She has an upcoming right hip replacement procedure with Dr. Rhona Raider. She has pain in her right hip at this time. She had completed a left hip replacement procedure earlier this year and reports going home the same day of the procedure. She had some cough and congestion due to allergies a couple of weeks ago and found relief with OTC allergy medication. She denies having any urinary burning, urinary frequency, frequent headaches, skin rashes, new moles, depression, anxiety.   Tooth procedure-She has an upcomming tooth extraction procedure next week due to abscess forming.   Oncology- She continues following up with her oncologist regularly.   Immunizations- She is not interested in receiving the flu vaccine at this time. She was recommended the new Covid-19 booster vaccine.    Past Medical History:  Diagnosis Date   Arthritis    Chicken pox    Complication of anesthesia    Per patient, very slow to wake up after anesthesia   Goals of care, counseling/discussion 12/22/2020   High cholesterol    High grade B-cell lymphoma ([place]) 12/22/2020   Hyperglycemia 11/10/2020   Melanoma ([place]) 2021   right hip   Melanoma ([place]) 10/28/2020   pt stated brain liver and bladder   PONV (postoperative nausea and vomiting)    Urinary incontinence     Past Surgical History:  Procedure Laterality Date   AIR/FLUID EXCHANGE Right 12/07/2020   Procedure: AIR/FLUID EXCHANGE;  Surgeon: Jalene Mullet, MD;  Location: [facility];  Service: Ophthalmology;  Laterality: Right;   APPENDECTOMY  1962   BIOPSY   12/14/2020   Procedure: BIOPSY;  Surgeon: Rush Landmark Telford Nab., MD;  Location: [facility];  Service: Gastroenterology;;   ESOPHAGOGASTRODUODENOSCOPY (EGD) WITH PROPOFOL N/A 12/14/2020   Procedure: ESOPHAGOGASTRODUODENOSCOPY (EGD) WITH PROPOFOL;  Surgeon: Irving Copas., MD;  Location: [facility];  Service: Gastroenterology;  Laterality: N/A;   EXCISION MELANOMA WITH SENTINEL LYMPH NODE BIOPSY Right 10/09/2019   Procedure: WIDE LOCAL EXCISION WITH ADVANCEMENT FLAP CLOSURE RIGHT HIP MELANOMA WITH SENTINEL LYMPH NODE BIOPSY;  Surgeon: Stark Klein, MD;  Location: [facility];  Service: General;  Laterality: Right;   FINE NEEDLE ASPIRATION  12/14/2020   Procedure: FINE NEEDLE ASPIRATION (FNA) LINEAR;  Surgeon: Irving Copas., MD;  Location: [facility];  Service: Gastroenterology;;   HERNIA REPAIR  2009   IR IMAGING GUIDED PORT INSERTION  12/30/2020   MOUTH SURGERY  11/2015   gum surgery and bone implant   PARS PLANA VITRECTOMY Right 12/07/2020   Procedure: PARS PLANA VITRECTOMY WITH 25 GAUGE;  Surgeon: Jalene Mullet, MD;  Location: [facility];  Service: Ophthalmology;  Laterality: Right;   PHOTOCOAGULATION WITH LASER Right 12/07/2020   Procedure: PHOTOCOAGULATION WITH ENDOLASER PANRITINAL COAGULATION;  Surgeon: Jalene Mullet, MD;  Location: [facility];  Service: Ophthalmology;  Laterality: Right;   TOTAL HIP ARTHROPLASTY Left 10/11/2021   Procedure: LEFT TOTAL HIP ARTHROPLASTY ANTERIOR APPROACH;  Surgeon: Melrose Nakayama, MD;  [facility];  Service: Orthopedics;  Laterality: Left;   UPPER ESOPHAGEAL ENDOSCOPIC ULTRASOUND (EUS) N/A 12/14/2020   Procedure:  UPPER ESOPHAGEAL ENDOSCOPIC ULTRASOUND (EUS);  Surgeon: Irving Copas., MD;  Location: [facility];  Service: Gastroenterology;  Laterality: N/A;   WISDOM TOOTH EXTRACTION      Family History  Problem Relation Age of Onset   Coronary artery disease Father        died at 18   COPD Father    Alcohol abuse Father     Diabetes Mellitus II Father    Coronary artery disease Sister    Cancer Sister        breast   Heart attack Brother    Hyperlipidemia Other        died 69- "natural causes"   Hyperlipidemia Other        4 siblings    Hypertension Other        3 siblings    Social History   Socioeconomic History   Marital status: Widowed    Spouse name: Not on file   Number of children: Not on file   Years of education: Not on file   Highest education level: Not on file  Occupational History   Occupation: retired  Tobacco Use   Smoking status: Never   Smokeless tobacco: Never  Vaping Use   Vaping Use: Never used  Substance and Sexual Activity   Alcohol use: Never   Drug use: Never   Sexual activity: Not Currently  Other Topics Concern   Not on file  Social History Narrative   05/09/2021   Presently living at [facility] in [place].   One Son- lives locally, 1 grand-daughter[place]aughter- 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   Right handed   Home one level    Caffeine 1-cup   Social Determinants of Health   Financial Resource Strain: Low Risk  (07/13/2021)   Overall Financial Resource Strain (CARDIA)    Difficulty of Paying Living Expenses: Not hard at all  Food Insecurity: No Food Insecurity (07/13/2021)   Hunger Vital Sign    Worried About Running Out of Food in the Last Year: Never true    Ran Out of Food in the Last Year: Never true  Transportation Needs: No Transportation Needs (07/13/2021)   PRAPARE - Hydrologist (Medical): No    Lack of Transportation (Non-Medical): No  Physical Activity: Insufficiently Active (07/13/2021)   Exercise Vital Sign    Days of Exercise per Week: 3 days    Minutes of Exercise per Session: 20 min  Stress: No Stress Concern Present (07/13/2021)   [place]    Feeling of Stress : Not at all  Social Connections: Socially  Isolated (07/13/2021)   Social Connection and Isolation Panel [NHANES]    Frequency of Communication with Friends and Family: More than three times a week    Frequency of Social Gatherings with Friends and Family: Once a week    Attends Religious Services: Never    Marine scientist or Organizations: No    Attends Archivist Meetings: Never    Marital Status: Widowed  Intimate Partner Violence: Not At Risk (07/13/2021)   Humiliation, Afraid, Rape, and Kick questionnaire    Fear of Current or Ex-Partner: No    Emotionally Abused: No    Physically Abused: No    Sexually Abused: No    Outpatient Medications Prior to Visit  Medication Sig Dispense Refill   acetaminophen (TYLENOL) 500 MG tablet Take 500-1,000 mg by mouth every  6 (six) hours as needed (for pain.).     antiseptic oral rinse (BIOTENE) LIQD 15 mLs by Mouth Rinse route every 4 (four) hours. 500 mL 4   aspirin EC 81 MG tablet Take 1 tablet (81 mg total) by mouth 2 (two) times daily after a meal. FOR 2 WEEKS THEN ONCE A DAY FOR 2 WEEKS FOR DVT PREVENTION. 45 tablet 0   docusate sodium (COLACE) 100 MG capsule Take 100 mg by mouth at bedtime.     HYDROcodone-acetaminophen (NORCO/VICODIN) 5-325 MG tablet Take 1-2 tablets by mouth every 6 (six) hours as needed for moderate pain or severe pain (post op pain). 40 tablet 0   Misc Natural Products (PRO NUTRIENTS FRUIT & VEGGIE PO) Take 4 capsules by mouth in the morning. Balance of Nature Fruit and Vegetable Supplements (2) Fruit + (2) Vegetable     Multiple Vitamins-Minerals (MULTIVITAMIN WITH MINERALS) tablet Take 1 tablet by mouth daily.     Polyethyl Glycol-Propyl Glycol (LUBRICANT EYE DROPS) 0.4-0.3 % SOLN Place 1-2 drops into both eyes 3 (three) times daily as needed (dry/irritated eyes.).     tiZANidine (ZANAFLEX) 4 MG tablet Take 1 tablet (4 mg total) by mouth every 6 (six) hours as needed for muscle spasms. 40 tablet 1   Facility-Administered Medications Prior to Visit   Medication Dose Route Frequency Provider Last Rate Last Admin   sodium chloride flush (NS) 0.9 % injection 10 mL  10 mL Intravenous PRN Volanda Napoleon, MD   10 mL at 10/04/21 0946    Allergies  Allergen Reactions   Decadron [Dexamethasone] Other (See Comments)    Made the patient's legs tired and she could not walk. Hallucinations   Plavix [Clopidogrel] Hives, Swelling and Other (See Comments)    Lips became swollen    Prednisone Other (See Comments)    Steroids caused psychological issues  Hallucinations   Codeine Nausea And Vomiting    Review of Systems  Genitourinary:  Negative for dysuria and frequency.  Musculoskeletal:        (+)right hip pain  Skin:  Negative for rash.       (-)new moles  Psychiatric/Behavioral:  Negative for depression. The patient is not nervous/anxious.        Objective:    Physical Exam Constitutional:      General: She is not in acute distress.    Appearance: Normal appearance. She is not ill-appearing.  HENT:     Head: Normocephalic and atraumatic.     Right Ear: Tympanic membrane, ear canal and external ear normal.     Left Ear: Tympanic membrane, ear canal and external ear normal.     Mouth/Throat:     Mouth: Mucous membranes are moist.     Pharynx: Oropharynx is clear. No oropharyngeal exudate or posterior oropharyngeal erythema.  Eyes:     Extraocular Movements: Extraocular movements intact.     Pupils: Pupils are equal, round, and reactive to light.  Cardiovascular:     Rate and Rhythm: Normal rate and regular rhythm.     Heart sounds: Normal heart sounds. No murmur heard.    No gallop.  Pulmonary:     Effort: Pulmonary effort is normal. No respiratory distress.     Breath sounds: Normal breath sounds. No wheezing or rales.  Musculoskeletal:     Comments: 5/5 strength in both upper and lower extremities  Lymphadenopathy:     Cervical: No cervical adenopathy.  Skin:    General: Skin is warm and dry.  Neurological:      Mental Status: She is alert and oriented to person, place, and time.  Psychiatric:        Judgment: Judgment normal.     BP 120/64 (BP Location: Right Arm, Patient Position: Sitting, Cuff Size: Small)   Pulse 71   Temp 98 F (36.7 C) (Oral)   Resp 16   Ht 5' 6"  (1.676 m)   Wt 156 lb (70.8 kg)   SpO2 98%   BMI 25.18 kg/m  Wt Readings from Last 3 Encounters:  12/19/21 156 lb (70.8 kg)  11/15/21 154 lb 8 oz (70.1 kg)  10/11/21 154 lb 5.2 oz (70 kg)      Assessment & Plan:   Problem List Items Addressed This Visit       Unprioritized   High grade B-cell lymphoma (HCC) (Chronic)    In remission- has follow up with Dr. Marin Olp scheduled.       Relevant Orders   CBC with Differential/Platelet   Comp Met (CMET)   Protime-INR   DG Chest 2 View   Preoperative clearance - Primary    EKG tracing is personally reviewed.  EKG notes NSR.  No acute changes. Labs and CXR as ordered. Surgical clearance pending review of these results.       Relevant Orders   EKG 12-Lead (Completed)   CBC with Differential/Platelet   Comp Met (CMET)   Urine Culture   Protime-INR   DG Chest 2 View   Addendum:  Reviewed lab work/CXR and EKG.  Of note, her platelet count is slightly reduced at 120K. I did review this result with her hematologist- Dr. Marin Olp and he is comfortable with her proceeding with surgery from a platelet standpoint.  OK to proceed with planned surgery from a medical standpoint.  No orders of the defined types were placed in this encounter.   I, Nance Pear, NP, personally preformed the services described in this documentation.  All medical record entries made by the scribe were at my direction and in my presence.  I have reviewed the chart and discharge instructions (if applicable) and agree that the record reflects my personal performance and is accurate and complete. 12/19/2021   I,Shehryar Baig,acting as a Education administrator for Nance Pear, NP.,have documented all  relevant documentation on the behalf of Nance Pear, NP,as directed by  Nance Pear, NP while in the presence of Nance Pear, NP.   Nance Pear, NP

## 2021-12-20 ENCOUNTER — Telehealth: Payer: Self-pay | Admitting: *Deleted

## 2021-12-20 ENCOUNTER — Other Ambulatory Visit: Payer: Self-pay | Admitting: *Deleted

## 2021-12-20 DIAGNOSIS — D649 Anemia, unspecified: Secondary | ICD-10-CM

## 2021-12-20 DIAGNOSIS — D696 Thrombocytopenia, unspecified: Secondary | ICD-10-CM

## 2021-12-20 DIAGNOSIS — Z95828 Presence of other vascular implants and grafts: Secondary | ICD-10-CM

## 2021-12-20 DIAGNOSIS — C8510 Unspecified B-cell lymphoma, unspecified site: Secondary | ICD-10-CM

## 2021-12-20 LAB — URINE CULTURE
MICRO NUMBER:: 14086748
SPECIMEN QUALITY:: ADEQUATE

## 2021-12-20 NOTE — Telephone Encounter (Signed)
Message received from patient requesting that lab results from 12/19/21 be reviewed that were drawn at Dr. Rowe Pavy office. Call placed back to [name] and she states that she is concerned that her platelets are still low with her upcoming hip surgery in November.  Informed Ariel [name] that we will recheck her CBC when she comes back on 12/27/21 for port flush.  Ariel [name] is agreeable to this plan and has no further questions or concerns at this time.

## 2021-12-22 ENCOUNTER — Telehealth: Payer: Self-pay | Admitting: Family

## 2021-12-22 NOTE — Telephone Encounter (Signed)
Pt would like to know the status of her pre-op paperwork. Please advise.

## 2021-12-22 NOTE — Telephone Encounter (Signed)
Patient advised we should be able to fax her form tomorrow

## 2021-12-23 ENCOUNTER — Encounter: Payer: Self-pay | Admitting: Family

## 2021-12-23 NOTE — Telephone Encounter (Signed)
Pt is hoping this can be done today.

## 2021-12-23 NOTE — Telephone Encounter (Signed)
Provider notified patient clearance letter was faxed today

## 2021-12-23 NOTE — Telephone Encounter (Signed)
Pt called to check and make sure that the paperwork had been faxed.   CB number: [phone]

## 2021-12-26 ENCOUNTER — Other Ambulatory Visit: Payer: Self-pay | Admitting: Orthopaedic Surgery

## 2021-12-27 ENCOUNTER — Inpatient Hospital Stay: Payer: Medicare Other | Attending: Internal Medicine

## 2021-12-27 ENCOUNTER — Inpatient Hospital Stay: Payer: Medicare Other

## 2021-12-27 VITALS — BP 109/60 | HR 69 | Resp 19

## 2021-12-27 DIAGNOSIS — D509 Iron deficiency anemia, unspecified: Secondary | ICD-10-CM | POA: Insufficient documentation

## 2021-12-27 DIAGNOSIS — Z95828 Presence of other vascular implants and grafts: Secondary | ICD-10-CM

## 2021-12-27 DIAGNOSIS — C8330 Diffuse large B-cell lymphoma, unspecified site: Secondary | ICD-10-CM | POA: Insufficient documentation

## 2021-12-27 DIAGNOSIS — C8510 Unspecified B-cell lymphoma, unspecified site: Secondary | ICD-10-CM

## 2021-12-27 LAB — CBC WITH DIFFERENTIAL (CANCER CENTER ONLY)
Abs Immature Granulocytes: 0.01 10*3/uL (ref 0.00–0.07)
Basophils Absolute: 0 10*3/uL (ref 0.0–0.1)
Basophils Relative: 0 %
Eosinophils Absolute: 0.1 10*3/uL (ref 0.0–0.5)
Eosinophils Relative: 2 %
HCT: 36.4 % (ref 36.0–46.0)
Hemoglobin: 12.4 g/dL (ref 12.0–15.0)
Immature Granulocytes: 0 %
Lymphocytes Relative: 40 %
Lymphs Abs: 1.6 10*3/uL (ref 0.7–4.0)
MCH: 32.6 pg (ref 26.0–34.0)
MCHC: 34.1 g/dL (ref 30.0–36.0)
MCV: 95.8 fL (ref 80.0–100.0)
Monocytes Absolute: 0.6 10*3/uL (ref 0.1–1.0)
Monocytes Relative: 14 %
Neutro Abs: 1.8 10*3/uL (ref 1.7–7.7)
Neutrophils Relative %: 44 %
Platelet Count: 139 10*3/uL — ABNORMAL LOW (ref 150–400)
RBC: 3.8 MIL/uL — ABNORMAL LOW (ref 3.87–5.11)
RDW: 11.9 % (ref 11.5–15.5)
WBC Count: 4.1 10*3/uL (ref 4.0–10.5)
nRBC: 0 % (ref 0.0–0.2)

## 2021-12-27 LAB — CMP (CANCER CENTER ONLY)
ALT: 22 U/L (ref 0–44)
AST: 21 U/L (ref 15–41)
Albumin: 4.7 g/dL (ref 3.5–5.0)
Alkaline Phosphatase: 70 U/L (ref 38–126)
Anion gap: 7 (ref 5–15)
BUN: 19 mg/dL (ref 8–23)
CO2: 30 mmol/L (ref 22–32)
Calcium: 10.3 mg/dL (ref 8.9–10.3)
Chloride: 99 mmol/L (ref 98–111)
Creatinine: 0.69 mg/dL (ref 0.44–1.00)
GFR, Estimated: >60 mL/min (ref >60)
Glucose, Bld: 107 mg/dL — ABNORMAL HIGH (ref 70–99)
Potassium: 4.4 mmol/L (ref 3.5–5.1)
Sodium: 136 mmol/L (ref 135–145)
Total Bilirubin: 0.5 mg/dL (ref 0.3–1.2)
Total Protein: 6.3 g/dL — ABNORMAL LOW (ref 6.5–8.1)

## 2021-12-27 MED ORDER — HEPARIN SOD (PORK) LOCK FLUSH 100 UNIT/ML IV SOLN
500.0000 [IU] | Freq: Once | INTRAVENOUS | Status: AC
  Administered 2021-12-27: 500 [IU] via INTRAVENOUS

## 2021-12-27 MED ORDER — SODIUM CHLORIDE 0.9% FLUSH
10.0000 mL | Freq: Once | INTRAVENOUS | Status: AC
  Administered 2021-12-27: 10 mL via INTRAVENOUS

## 2021-12-27 NOTE — Patient Instructions (Signed)

## 2021-12-28 ENCOUNTER — Encounter: Payer: Self-pay | Admitting: *Deleted

## 2021-12-28 NOTE — Progress Notes (Signed)
Patient will need a PET scan prior to her next appointment. Scheduled on 02/06/2022.  Patient is aware of PET appointment including date, time, and location. The following prep is reviewed with patient and confirmed with teachback: - arrive 30 minutes before appointment time - NPO except water for 6h before scan. No candy, no gum - hold any diabetic medication the morning of the scan - have a low carb dinner the night prior Radiology Information sheet also mailed to patient's home for reinforcement of education.  Oncology Nurse Navigator Documentation     12/28/2021    1:00 PM  Oncology Nurse Navigator Flowsheets  Navigator Follow Up Date: 02/06/2022  Navigator Follow Up Reason: Scan Review  Navigator Location [facility]  Navigator Encounter Type Appt/Treatment Plan Review;Telephone  Telephone Appt Confirmation/Clarification;Education;Outgoing Call  Patient Visit Type MedOnc  Treatment Phase Post-Tx Follow-up  Barriers/Navigation Needs No Barriers At This Time  Education Other  Interventions Coordination of Care;Education  Acuity Level 1-No Barriers  Coordination of Care Radiology  Education Method Verbal;Written  Support Groups/Services Friends and Family  Time Spent with Patient 30

## 2021-12-28 NOTE — Care Plan (Signed)
Ortho Bundle Case Management Note  Patient Details  Name: Ariel [name] MRN: [id number] Date of Birth: August 20, 1955  Spoke with patient prior to surgery. She will discharge t to home with family to assist. Has rolling walker. OPPT ser up with [facility]. Patient and MD in agreement with plan. Choice offered                DME Arranged:    DME Agency:     HH Arranged:    HH Agency:     Additional Comments: Please contact me with any questions of if this plan should need to change.  Ladell Heads,  [place] Orthopaedic Specialist  [phone] 12/28/2021, 2:06 PM

## 2022-01-04 NOTE — Patient Instructions (Signed)
DUE TO COVID-19 ONLY TWO VISITORS  (aged 67 and older)  ARE ALLOWED TO COME WITH YOU AND STAY IN THE WAITING ROOM ONLY DURING PRE OP AND PROCEDURE.   **NO VISITORS ARE ALLOWED IN THE SHORT STAY AREA OR RECOVERY ROOM!!**  IF YOU WILL BE ADMITTED INTO THE HOSPITAL YOU ARE ALLOWED ONLY FOUR SUPPORT PEOPLE DURING VISITATION HOURS ONLY (7 AM -8PM)   The support person(s) must pass our screening, gel in and out, and wear a mask at all times, including in the patient's room. Patients must also wear a mask when staff or their support person are in the room. Visitors GUEST BADGE MUST BE WORN VISIBLY  One adult visitor may remain with you overnight and MUST be in the room by 8 P.M.     Your procedure is scheduled on: 01/10/22   Report to [facility] Main Entrance    Report to admitting at : 8:50 AM   Call this number if you have problems the morning of surgery [phone]   Do not eat food :After Midnight.   After Midnight you may have the following liquids until : 8:15 AM DAY OF SURGERY  Water Black Coffee (sugar ok, NO MILK/CREAM OR CREAMERS)  Tea (sugar ok, NO MILK/CREAM OR CREAMERS) regular and decaf                             Plain Jell-O (NO RED)                                           Fruit ices (not with fruit pulp, NO RED)                                     Popsicles (NO RED)                                                                  Juice: apple, WHITE grape, WHITE cranberry Sports drinks like Gatorade (NO RED)              Drink Ensure drink AT : 8:15 AM the day of surgery.    The day of surgery:  Drink ONE (1) Pre-Surgery Clear Ensure or G2 at AM the morning of surgery. Drink in one sitting. Do not sip.  This drink was given to you during your hospital  pre-op appointment visit. Nothing else to drink after completing the  Pre-Surgery Clear Ensure or G2.          If you have questions, please contact your surgeon's office.    Oral Hygiene is also  important to reduce your risk of infection.                                    Remember - BRUSH YOUR TEETH THE MORNING OF SURGERY WITH YOUR REGULAR TOOTHPASTE   Do NOT smoke after Midnight   Take these medicines the morning of surgery with A SIP  OF WATER: Tylenol as needed.                              You may not have any metal on your body including hair pins, jewelry, and body piercing             Do not wear make up, lotions, powders, perfumes/cologne, or deodorant  Do not wear nail polish including gel and S&S, artificial/acrylic nails, or any other type of covering on natural nails including finger and toenails. If you have artificial nails, gel coating, etc. that needs to be removed by a nail salon please have this removed prior to surgery or surgery may need to be canceled/ delayed if the surgeon/ anesthesia feels like they are unable to be safely monitored.   Do not shave  48 hours prior to surgery.   Do not bring valuables to the hospital. [place].   Contacts, dentures or bridgework may not be worn into surgery.   Bring small overnight bag day of surgery.   DO NOT [place]. PHARMACY WILL DISPENSE MEDICATIONS LISTED ON YOUR MEDICATION LIST TO YOU DURING YOUR ADMISSION [place]!    Patients discharged on the day of surgery will not be allowed to drive home.  Someone NEEDS to stay with you for the first 24 hours after anesthesia.   Special Instructions: Bring a copy of your healthcare power of attorney and living will documents         the day of surgery if you haven't scanned them before.              Please read over the following fact sheets you were given: IF YOU HAVE QUESTIONS ABOUT YOUR PRE-OP INSTRUCTIONS PLEASE CALL [phone]    [facility] - Preparing for Surgery Before surgery, you can play an important role.  Because skin is not sterile, your skin needs to be as free of  germs as possible.  You can reduce the number of germs on your skin by washing with CHG (chlorahexidine gluconate) soap before surgery.  CHG is an antiseptic cleaner which kills germs and bonds with the skin to continue killing germs even after washing. Please DO NOT use if you have an allergy to CHG or antibacterial soaps.  If your skin becomes reddened/irritated stop using the CHG and inform your nurse when you arrive at Short Stay. Do not shave (including legs and underarms) for at least 48 hours prior to the first CHG shower.  You may shave your face/neck. Please follow these instructions carefully:  1.  Shower with CHG Soap the night before surgery and the  morning of Surgery.  2.  If you choose to wash your hair, wash your hair first as usual with your  normal  shampoo.  3.  After you shampoo, rinse your hair and body thoroughly to remove the  shampoo.                           4.  Use CHG as you would any other liquid soap.  You can apply chg directly  to the skin and wash                       Gently with  a scrungie or clean washcloth.  5.  Apply the CHG Soap to your body ONLY FROM THE NECK DOWN.   Do not use on face/ open                           Wound or open sores. Avoid contact with eyes, ears mouth and genitals (private parts).                       Wash face,  Genitals (private parts) with your normal soap.             6.  Wash thoroughly, paying special attention to the area where your surgery  will be performed.  7.  Thoroughly rinse your body with warm water from the neck down.  8.  DO NOT shower/wash with your normal soap after using and rinsing off  the CHG Soap.                9.  Pat yourself dry with a clean towel.            10.  Wear clean pajamas.            11.  Place clean sheets on your bed the night of your first shower and do not  sleep with pets. Day of Surgery : Do not apply any lotions/deodorants the morning of surgery.  Please wear clean clothes to the  hospital/surgery center.  FAILURE TO FOLLOW THESE INSTRUCTIONS MAY RESULT IN THE CANCELLATION OF YOUR SURGERY PATIENT SIGNATURE_________________________________  NURSE SIGNATURE__________________________________  ________________________________________________________________________  Ariel [name]  An incentive spirometer is a tool that can help keep your lungs clear and active. This tool measures how well you are filling your lungs with each breath. Taking long deep breaths may help reverse or decrease the chance of developing breathing (pulmonary) problems (especially infection) following: A long period of time when you are unable to move or be active. BEFORE THE PROCEDURE  If the spirometer includes an indicator to show your best effort, your nurse or respiratory therapist will set it to a desired goal. If possible, sit up straight or lean slightly forward. Try not to slouch. Hold the incentive spirometer in an upright position. INSTRUCTIONS FOR USE  Sit on the edge of your bed if possible, or sit up as far as you can in bed or on a chair. Hold the incentive spirometer in an upright position. Breathe out normally. Place the mouthpiece in your mouth and seal your lips tightly around it. Breathe in slowly and as deeply as possible, raising the piston or the ball toward the top of the column. Hold your breath for 3-5 seconds or for as long as possible. Allow the piston or ball to fall to the bottom of the column. Remove the mouthpiece from your mouth and breathe out normally. Rest for a few seconds and repeat Steps 1 through 7 at least 10 times every 1-2 hours when you are awake. Take your time and take a few normal breaths between deep breaths. The spirometer may include an indicator to show your best effort. Use the indicator as a goal to work toward during each repetition. After each set of 10 deep breaths, practice coughing to be sure your lungs are clear. If you have an  incision (the cut made at the time of surgery), support your incision when coughing by placing a pillow or rolled up towels firmly against it.  Once you are able to get out of bed, walk around indoors and cough well. You may stop using the incentive spirometer when instructed by your caregiver.  RISKS AND COMPLICATIONS Take your time so you do not get dizzy or light-headed. If you are in pain, you may need to take or ask for pain medication before doing incentive spirometry. It is harder to take a deep breath if you are having pain. AFTER USE Rest and breathe slowly and easily. It can be helpful to keep track of a log of your progress. Your caregiver can provide you with a simple table to help with this. If you are using the spirometer at home, follow these instructions: [place] IF:  You are having difficultly using the spirometer. You have trouble using the spirometer as often as instructed. Your pain medication is not giving enough relief while using the spirometer. You develop fever of 100.5 F (38.1 C) or higher. SEEK IMMEDIATE MEDICAL CARE IF:  You cough up bloody sputum that had not been present before. You develop fever of 102 F (38.9 C) or greater. You develop worsening pain at or near the incision site. MAKE SURE YOU:  Understand these instructions. Will watch your condition. Will get help right away if you are not doing well or get worse. Document Released: 06/26/2006 Document Revised: 05/08/2011 Document Reviewed: 08/27/2006 [facility] Patient Information 2014 [place], [place].   ________________________________________________________________________

## 2022-01-05 ENCOUNTER — Other Ambulatory Visit: Payer: Self-pay

## 2022-01-05 ENCOUNTER — Encounter (HOSPITAL_COMMUNITY)
Admission: RE | Admit: 2022-01-05 | Discharge: 2022-01-05 | Disposition: A | Discharge status: Home / Self Care | Payer: Medicare Other | Source: Ambulatory Visit | Attending: Orthopaedic Surgery | Admitting: Orthopaedic Surgery

## 2022-01-05 ENCOUNTER — Encounter (HOSPITAL_COMMUNITY): Payer: Self-pay

## 2022-01-05 VITALS — BP 121/79 | HR 91 | Temp 98.1°F | Ht 66.0 in | Wt 151.0 lb

## 2022-01-05 DIAGNOSIS — Z01812 Encounter for preprocedural laboratory examination: Secondary | ICD-10-CM | POA: Insufficient documentation

## 2022-01-05 DIAGNOSIS — Z01818 Encounter for other preprocedural examination: Secondary | ICD-10-CM

## 2022-01-05 LAB — SURGICAL PCR SCREEN
MRSA, PCR: NEGATIVE
Staphylococcus aureus: NEGATIVE

## 2022-01-05 NOTE — Progress Notes (Signed)
For Short Stay: [place] appointment date:  Bowel Prep reminder:   For Anesthesia: PCP - [name]: NP Cardiologist -   Chest x-ray - 12/19/21 EKG - 12/19/21 Stress Test -  ECHO - 10/07/20 Cardiac Cath -  Pacemaker/ICD device last checked: Pacemaker orders received: Device Rep notified:  Spinal Cord Stimulator:  Sleep Study -  CPAP -   Fasting Blood Sugar -  Checks Blood Sugar _____ times a day Date and result of last Hgb A1c-  Last dose of GLP1 agonist-  GLP1 instructions:   Last dose of SGLT-2 inhibitors-  SGLT-2 instructions:   Blood Thinner Instructions: Aspirin Instructions: Last Dose:  Activity level: Can go up a flight of stairs and activities of daily living without stopping and without chest pain and/or shortness of breath   Able to exercise without chest pain and/or shortness of breath   Unable to go up a flight of stairs without chest pain and/or shortness of breath     Anesthesia review:   Patient denies shortness of breath, fever, cough and chest pain at PAT appointment   Patient verbalized understanding of instructions that were given to them at the PAT appointment. Patient was also instructed that they will need to review over the PAT instructions again at home before surgery.

## 2022-01-09 MED ORDER — TRANEXAMIC ACID 1000 MG/10ML IV SOLN
2000.0000 mg | INTRAVENOUS | Status: DC
  Filled 2022-01-09: qty 20

## 2022-01-09 NOTE — H&P (Signed)
TOTAL HIP ADMISSION H&P  Patient is admitted for right total hip arthroplasty.  Subjective:  Chief Complaint: right hip pain  HPI: Ariel [name], 66 y.o. female, has a history of pain and functional disability in the right hip(s) due to arthritis and patient has failed non-surgical conservative treatments for greater than 12 weeks to include NSAID's and/or analgesics, corticosteriod injections, flexibility and strengthening excercises, use of assistive devices, weight reduction as appropriate, and activity modification.  Onset of symptoms was gradual starting 5 years ago with gradually worsening course since that time.The patient noted no past surgery on the right hip(s).  Patient currently rates pain in the right hip at 10 out of 10 with activity. Patient has night pain, worsening of pain with activity and weight bearing, trendelenberg gait, pain that interfers with activities of daily living, and crepitus. Patient has evidence of subchondral cysts, subchondral sclerosis, periarticular osteophytes, and joint space narrowing by imaging studies. This condition presents safety issues increasing the risk of falls.  There is no current active infection.  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[place]) 05/02/2021   Diffuse large B cell lymphoma ([place]) 03/07/2021   High grade B-cell lymphoma ([place]) 12/22/2020   Goals of care, counseling/discussion 12/22/2020   Malignant neoplasm metastatic to brain ([place]) 12/09/2020   Brain tumor ([place]) 12/08/2020   Hyponatremia 12/08/2020   Night sweat 79/89/2119   Complication of anesthesia 11/22/2020   PONV (postoperative nausea and vomiting) 11/22/2020   Hyperglycemia 11/10/2020   TIA (transient ischemic attack)  10/07/2020   Iron deficiency anemia 10/07/2020   Melanoma ([place]) 2021   Multinodular goiter 12/22/2013   Subclinical hyperthyroidism 12/19/2013   Urinary incontinence 11/19/2013   Hyperlipidemia 11/19/2012   Routine general medical examination at a health care facility 11/18/2012   Past Medical History:  Diagnosis Date   Arthritis    Chicken pox    Complication of anesthesia    Per patient, very slow to wake up after anesthesia   Goals of care, counseling/discussion 12/22/2020   High cholesterol    High grade B-cell lymphoma ([place]) 12/22/2020   Hyperglycemia 11/10/2020   Melanoma ([place]) 2021   right hip   Melanoma ([place]) 10/28/2020   pt stated brain liver and bladder   PONV (postoperative nausea and vomiting)    Urinary incontinence     Past Surgical History:  Procedure Laterality Date   AIR/FLUID EXCHANGE Right 12/07/2020   Procedure: AIR/FLUID EXCHANGE;  Surgeon: Jalene Mullet, MD;  Location: [facility];  Service: Ophthalmology;  Laterality: Right;   APPENDECTOMY  1962   BIOPSY  12/14/2020   Procedure: BIOPSY;  Surgeon: Rush Landmark Telford Nab., MD;  Location: [facility];  Service: Gastroenterology;;   ESOPHAGOGASTRODUODENOSCOPY (EGD) WITH PROPOFOL N/A 12/14/2020   Procedure: ESOPHAGOGASTRODUODENOSCOPY (EGD) WITH PROPOFOL;  Surgeon: Irving Copas., MD;  Location: [facility];  Service: Gastroenterology;  Laterality: N/A;   EXCISION MELANOMA WITH SENTINEL LYMPH NODE BIOPSY Right 10/09/2019   Procedure: WIDE LOCAL EXCISION WITH ADVANCEMENT FLAP CLOSURE RIGHT HIP MELANOMA WITH SENTINEL LYMPH NODE BIOPSY;  Surgeon: Stark Klein, MD;  Location: [facility];  Service: General;  Laterality: Right;   FINE NEEDLE ASPIRATION  12/14/2020   Procedure: FINE NEEDLE ASPIRATION (FNA) LINEAR;  Surgeon: Irving Copas., MD;  Location: [facility];  Service: Gastroenterology;;  HERNIA REPAIR  2009   IR IMAGING GUIDED PORT INSERTION  12/30/2020   MOUTH SURGERY  11/2015   gum surgery  and bone implant   PARS PLANA VITRECTOMY Right 12/07/2020   Procedure: PARS PLANA VITRECTOMY WITH 25 GAUGE;  Surgeon: Jalene Mullet, MD;  Location: [facility];  Service: Ophthalmology;  Laterality: Right;   PHOTOCOAGULATION WITH LASER Right 12/07/2020   Procedure: PHOTOCOAGULATION WITH ENDOLASER PANRITINAL COAGULATION;  Surgeon: Jalene Mullet, MD;  Location: [facility];  Service: Ophthalmology;  Laterality: Right;   TOTAL HIP ARTHROPLASTY Left 10/11/2021   Procedure: LEFT TOTAL HIP ARTHROPLASTY ANTERIOR APPROACH;  Surgeon: Melrose Nakayama, MD;  Location: [facility];  Service: Orthopedics;  Laterality: Left;   UPPER ESOPHAGEAL ENDOSCOPIC ULTRASOUND (EUS) N/A 12/14/2020   Procedure: UPPER ESOPHAGEAL ENDOSCOPIC ULTRASOUND (EUS);  Surgeon: Irving Copas., MD;  Location: [facility];  Service: Gastroenterology;  Laterality: N/A;   WISDOM TOOTH EXTRACTION      Current Facility-Administered Medications  Medication Dose Route Frequency Provider Last Rate Last Admin   [START ON 01/10/2022] tranexamic acid (CYKLOKAPRON) 2,000 mg in sodium chloride 0.9 % 50 mL Topical Application  1,478 mg Topical To OR Melrose Nakayama, MD       Current Outpatient Medications  Medication Sig Dispense Refill Last Dose   acetaminophen (TYLENOL) 500 MG tablet Take 500-1,000 mg by mouth every 6 (six) hours as needed (for pain.).      Misc Natural Products (PRO NUTRIENTS FRUIT & VEGGIE PO) Take 4 capsules by mouth in the morning. Balance of Nature Fruit and Vegetable Supplements (2) Fruit + (2) Vegetable      Multiple Vitamins-Minerals (MULTIVITAMIN WITH MINERALS) tablet Take 1 tablet by mouth daily.      Facility-Administered Medications Ordered in Other Encounters  Medication Dose Route Frequency Provider Last Rate Last Admin   sodium chloride flush (NS) 0.9 % injection 10 mL  10 mL Intravenous PRN Volanda Napoleon, MD   10 mL at 10/04/21 0946   Allergies  Allergen Reactions   Decadron [Dexamethasone] Other (See  Comments)    Made the patient's legs tired and she could not walk. Hallucinations   Plavix [Clopidogrel] Hives, Swelling and Other (See Comments)    Lips became swollen    Prednisone Other (See Comments)    Steroids caused psychological issues  Hallucinations   Codeine Nausea And Vomiting    Social History   Tobacco Use   Smoking status: Never   Smokeless tobacco: Never  Substance Use Topics   Alcohol use: Never    Family History  Problem Relation Age of Onset   Coronary artery disease Father        died at 21   COPD Father    Alcohol abuse Father    Diabetes Mellitus II Father    Coronary artery disease Sister    Cancer Sister        breast   Heart attack Brother    Hyperlipidemia Other        died 68- "natural causes"   Hyperlipidemia Other        4 siblings    Hypertension Other        3 siblings     Review of Systems  Musculoskeletal:  Positive for arthralgias.       Right hip  All other systems reviewed and are negative.   Objective:  Physical Exam Constitutional:      Appearance: Normal appearance.  HENT:     Head: Normocephalic and atraumatic.  Nose: Nose normal.     Mouth/Throat:     Pharynx: Oropharynx is clear.  Eyes:     Extraocular Movements: Extraocular movements intact.  Pulmonary:     Effort: Pulmonary effort is normal.  Abdominal:     Palpations: Abdomen is soft.  Musculoskeletal:     Cervical back: Normal range of motion.     Comments: Right hip motion is extremely painful especially with internal rotation.  She is walking with an altered gait.  Leg lengths are roughly equal.   Skin:    General: Skin is warm and dry.  Neurological:     General: No focal deficit present.     Mental Status: She is alert and oriented to person, place, and time. Mental status is at baseline.  Psychiatric:        Mood and Affect: Mood normal.        Behavior: Behavior normal.        Thought Content: Thought content normal.        Judgment: Judgment  normal.     Vital signs in last 24 hours:    Labs:   Estimated body mass index is 24.37 kg/m as calculated from the following:   Height as of 01/05/22: '5\' 6"'$  (1.676 m).   Weight as of 01/05/22: 68.5 kg.   Imaging Review Plain radiographs demonstrate severe degenerative joint disease of the right hip(s). The bone quality appears to be good for age and reported activity level.      Assessment/Plan:  End stage priamry arthritis, right hip(s)  The patient history, physical examination, clinical judgement of the provider and imaging studies are consistent with end stage degenerative joint disease of the right hip(s) and total hip arthroplasty is deemed medically necessary. The treatment options including medical management, injection therapy, arthroscopy and arthroplasty were discussed at length. The risks and benefits of total hip arthroplasty were presented and reviewed. The risks due to aseptic loosening, infection, stiffness, dislocation/subluxation,  thromboembolic complications and other imponderables were discussed.  The patient acknowledged the explanation, agreed to proceed with the plan and consent was signed. Patient is being admitted for inpatient treatment for surgery, pain control, PT, OT, prophylactic antibiotics, VTE prophylaxis, progressive ambulation and ADL's and discharge planning.The patient is planning to be discharged home with home health services

## 2022-01-10 ENCOUNTER — Encounter (HOSPITAL_COMMUNITY)
Admission: RE | Disposition: A | Discharge status: Home / Self Care | Payer: Self-pay | Source: Ambulatory Visit | Attending: Orthopaedic Surgery

## 2022-01-10 ENCOUNTER — Ambulatory Visit (HOSPITAL_BASED_OUTPATIENT_CLINIC_OR_DEPARTMENT_OTHER): Payer: Medicare Other | Admitting: Certified Registered Nurse Anesthetist

## 2022-01-10 ENCOUNTER — Ambulatory Visit (HOSPITAL_COMMUNITY): Payer: Medicare Other

## 2022-01-10 ENCOUNTER — Encounter (HOSPITAL_COMMUNITY): Payer: Self-pay | Admitting: Orthopaedic Surgery

## 2022-01-10 ENCOUNTER — Other Ambulatory Visit: Payer: Self-pay

## 2022-01-10 ENCOUNTER — Ambulatory Visit (HOSPITAL_COMMUNITY)
Admission: RE | Admit: 2022-01-10 | Discharge: 2022-01-10 | Disposition: A | Discharge status: Home / Self Care | Payer: Medicare Other | Source: Ambulatory Visit | Attending: Orthopaedic Surgery | Admitting: Orthopaedic Surgery

## 2022-01-10 ENCOUNTER — Ambulatory Visit (HOSPITAL_COMMUNITY): Payer: Medicare Other | Admitting: Certified Registered Nurse Anesthetist

## 2022-01-10 DIAGNOSIS — M1611 Unilateral primary osteoarthritis, right hip: Secondary | ICD-10-CM | POA: Diagnosis present

## 2022-01-10 DIAGNOSIS — Z8673 Personal history of transient ischemic attack (TIA), and cerebral infarction without residual deficits: Secondary | ICD-10-CM | POA: Diagnosis not present

## 2022-01-10 DIAGNOSIS — Z96641 Presence of right artificial hip joint: Secondary | ICD-10-CM | POA: Diagnosis not present

## 2022-01-10 DIAGNOSIS — Z471 Aftercare following joint replacement surgery: Secondary | ICD-10-CM | POA: Diagnosis not present

## 2022-01-10 DIAGNOSIS — M879 Osteonecrosis, unspecified: Secondary | ICD-10-CM | POA: Diagnosis not present

## 2022-01-10 DIAGNOSIS — M87051 Idiopathic aseptic necrosis of right femur: Secondary | ICD-10-CM | POA: Diagnosis not present

## 2022-01-10 HISTORY — PX: TOTAL HIP ARTHROPLASTY: SHX124

## 2022-01-10 LAB — TYPE AND SCREEN
ABO/RH(D): A POS
Antibody Screen: NEGATIVE

## 2022-01-10 SURGERY — ARTHROPLASTY, HIP, TOTAL, ANTERIOR APPROACH
Anesthesia: Spinal | Site: Hip | Laterality: Right

## 2022-01-10 MED ORDER — BUPIVACAINE LIPOSOME 1.3 % IJ SUSP
INTRAMUSCULAR | Status: DC | PRN
  Administered 2022-01-10: 20 mL

## 2022-01-10 MED ORDER — TRAMADOL HCL 50 MG PO TABS: 50.0000 mg | ORAL_TABLET | Freq: Four times a day (QID) | ORAL | 0 refills | Status: DC | PRN

## 2022-01-10 MED ORDER — TRANEXAMIC ACID-NACL 1000-0.7 MG/100ML-% IV SOLN
1000.0000 mg | INTRAVENOUS | Status: AC
  Administered 2022-01-10: 1000 mg via INTRAVENOUS
  Filled 2022-01-10: qty 100

## 2022-01-10 MED ORDER — HYDROCODONE-ACETAMINOPHEN 7.5-325 MG PO TABS: 1.0000 | ORAL_TABLET | ORAL | Status: DC | PRN

## 2022-01-10 MED ORDER — HYDROCODONE-ACETAMINOPHEN 5-325 MG PO TABS: 1.0000 | ORAL_TABLET | ORAL | Status: DC | PRN

## 2022-01-10 MED ORDER — KETOROLAC TROMETHAMINE 15 MG/ML IJ SOLN: 7.5000 mg | Freq: Four times a day (QID) | INTRAMUSCULAR | Status: DC

## 2022-01-10 MED ORDER — BUPIVACAINE LIPOSOME 1.3 % IJ SUSP
INTRAMUSCULAR | Status: AC
  Filled 2022-01-10: qty 10

## 2022-01-10 MED ORDER — TIZANIDINE HCL 4 MG PO TABS: 4.0000 mg | ORAL_TABLET | Freq: Four times a day (QID) | ORAL | 0 refills | Status: DC | PRN

## 2022-01-10 MED ORDER — CEFAZOLIN SODIUM-DEXTROSE 2-4 GM/100ML-% IV SOLN
2.0000 g | INTRAVENOUS | Status: AC
  Administered 2022-01-10: 2 g via INTRAVENOUS
  Filled 2022-01-10: qty 100

## 2022-01-10 MED ORDER — FENTANYL CITRATE PF 50 MCG/ML IJ SOSY
PREFILLED_SYRINGE | INTRAMUSCULAR | Status: AC
  Filled 2022-01-10: qty 1

## 2022-01-10 MED ORDER — LACTATED RINGERS IV BOLUS
250.0000 mL | Freq: Once | INTRAVENOUS | Status: AC
  Administered 2022-01-10: 250 mL via INTRAVENOUS

## 2022-01-10 MED ORDER — ASPIRIN 81 MG PO TBEC: 81.0000 mg | DELAYED_RELEASE_TABLET | Freq: Two times a day (BID) | ORAL | 0 refills | Status: DC

## 2022-01-10 MED ORDER — STERILE WATER FOR IRRIGATION IR SOLN
Status: DC | PRN
  Administered 2022-01-10: 2000 mL

## 2022-01-10 MED ORDER — MEPERIDINE HCL 50 MG/ML IJ SOLN: 6.2500 mg | INTRAMUSCULAR | Status: DC | PRN

## 2022-01-10 MED ORDER — LACTATED RINGERS IV SOLN: INTRAVENOUS | Status: DC | PRN

## 2022-01-10 MED ORDER — BUPIVACAINE-EPINEPHRINE (PF) 0.5% -1:200000 IJ SOLN
INTRAMUSCULAR | Status: AC
  Filled 2022-01-10: qty 30

## 2022-01-10 MED ORDER — PHENYLEPHRINE HCL-NACL 20-0.9 MG/250ML-% IV SOLN
INTRAVENOUS | Status: DC | PRN
  Administered 2022-01-10: 40 ug/min via INTRAVENOUS

## 2022-01-10 MED ORDER — LACTATED RINGERS IV SOLN: INTRAVENOUS | Status: DC

## 2022-01-10 MED ORDER — FENTANYL CITRATE (PF) 100 MCG/2ML IJ SOLN
INTRAMUSCULAR | Status: AC
  Filled 2022-01-10: qty 2

## 2022-01-10 MED ORDER — FENTANYL CITRATE (PF) 100 MCG/2ML IJ SOLN
INTRAMUSCULAR | Status: DC | PRN
  Administered 2022-01-10 (×2): 50 ug via INTRAVENOUS

## 2022-01-10 MED ORDER — TRAMADOL HCL 50 MG PO TABS
ORAL_TABLET | ORAL | Status: AC
  Filled 2022-01-10: qty 1

## 2022-01-10 MED ORDER — PROPOFOL 500 MG/50ML IV EMUL
INTRAVENOUS | Status: DC | PRN
  Administered 2022-01-10: 75 ug/kg/min via INTRAVENOUS

## 2022-01-10 MED ORDER — MIDAZOLAM HCL 2 MG/2ML IJ SOLN
INTRAMUSCULAR | Status: DC | PRN
  Administered 2022-01-10 (×2): 1 mg via INTRAVENOUS

## 2022-01-10 MED ORDER — LACTATED RINGERS IV BOLUS
500.0000 mL | Freq: Once | INTRAVENOUS | Status: AC
  Administered 2022-01-10: 500 mL via INTRAVENOUS

## 2022-01-10 MED ORDER — ONDANSETRON HCL 4 MG/2ML IJ SOLN
INTRAMUSCULAR | Status: DC | PRN
  Administered 2022-01-10: 4 mg via INTRAVENOUS

## 2022-01-10 MED ORDER — KETOROLAC TROMETHAMINE 15 MG/ML IJ SOLN
15.0000 mg | Freq: Once | INTRAMUSCULAR | Status: AC
  Administered 2022-01-10: 15 mg via INTRAVENOUS

## 2022-01-10 MED ORDER — TRANEXAMIC ACID 1000 MG/10ML IV SOLN
INTRAVENOUS | Status: DC | PRN
  Administered 2022-01-10: 2000 mg via TOPICAL

## 2022-01-10 MED ORDER — CHLORHEXIDINE GLUCONATE 0.12 % MT SOLN
15.0000 mL | Freq: Once | OROMUCOSAL | Status: AC
  Administered 2022-01-10: 15 mL via OROMUCOSAL

## 2022-01-10 MED ORDER — ACETAMINOPHEN 325 MG PO TABS: 325.0000 mg | ORAL_TABLET | ORAL | Status: DC | PRN

## 2022-01-10 MED ORDER — MIDAZOLAM HCL 2 MG/2ML IJ SOLN
INTRAMUSCULAR | Status: AC
  Filled 2022-01-10: qty 2

## 2022-01-10 MED ORDER — OXYCODONE HCL 5 MG PO TABS: 5.0000 mg | ORAL_TABLET | Freq: Once | ORAL | Status: DC | PRN

## 2022-01-10 MED ORDER — ONDANSETRON HCL 4 MG/2ML IJ SOLN
INTRAMUSCULAR | Status: AC
  Filled 2022-01-10: qty 2

## 2022-01-10 MED ORDER — PHENYLEPHRINE HCL (PRESSORS) 10 MG/ML IV SOLN
INTRAVENOUS | Status: DC | PRN
  Administered 2022-01-10 (×2): 80 ug via INTRAVENOUS

## 2022-01-10 MED ORDER — KETOROLAC TROMETHAMINE 15 MG/ML IJ SOLN
INTRAMUSCULAR | Status: AC
  Filled 2022-01-10: qty 1

## 2022-01-10 MED ORDER — POVIDONE-IODINE 10 % EX SWAB
2.0000 | Freq: Once | CUTANEOUS | Status: AC
  Administered 2022-01-10: 2 via TOPICAL

## 2022-01-10 MED ORDER — FENTANYL CITRATE PF 50 MCG/ML IJ SOSY
25.0000 ug | PREFILLED_SYRINGE | INTRAMUSCULAR | Status: DC | PRN
  Administered 2022-01-10 (×2): 25 ug via INTRAVENOUS
  Administered 2022-01-10: 50 ug via INTRAVENOUS

## 2022-01-10 MED ORDER — BUPIVACAINE-EPINEPHRINE 0.5% -1:200000 IJ SOLN
INTRAMUSCULAR | Status: DC | PRN
  Administered 2022-01-10: 30 mL

## 2022-01-10 MED ORDER — CEFAZOLIN SODIUM-DEXTROSE 2-4 GM/100ML-% IV SOLN
INTRAVENOUS | Status: AC
  Administered 2022-01-10: 2 g via INTRAVENOUS
  Filled 2022-01-10: qty 100

## 2022-01-10 MED ORDER — 0.9 % SODIUM CHLORIDE (POUR BTL) OPTIME
TOPICAL | Status: DC | PRN
  Administered 2022-01-10: 1000 mL

## 2022-01-10 MED ORDER — ACETAMINOPHEN 160 MG/5ML PO SOLN
325.0000 mg | ORAL | Status: DC | PRN
  Administered 2022-01-10: 650 mg via ORAL

## 2022-01-10 MED ORDER — TRAMADOL HCL 50 MG PO TABS
50.0000 mg | ORAL_TABLET | Freq: Once | ORAL | Status: AC
  Administered 2022-01-10: 50 mg via ORAL

## 2022-01-10 MED ORDER — ACETAMINOPHEN 160 MG/5ML PO SOLN
ORAL | Status: AC
  Filled 2022-01-10: qty 20.3

## 2022-01-10 MED ORDER — CEFAZOLIN SODIUM-DEXTROSE 2-4 GM/100ML-% IV SOLN: 2.0000 g | Freq: Four times a day (QID) | INTRAVENOUS | Status: DC

## 2022-01-10 MED ORDER — OXYCODONE HCL 5 MG/5ML PO SOLN: 5.0000 mg | Freq: Once | ORAL | Status: DC | PRN

## 2022-01-10 MED ORDER — PHENYLEPHRINE 80 MCG/ML (10ML) SYRINGE FOR IV PUSH (FOR BLOOD PRESSURE SUPPORT)
PREFILLED_SYRINGE | INTRAVENOUS | Status: AC
  Filled 2022-01-10: qty 10

## 2022-01-10 MED ORDER — BUPIVACAINE LIPOSOME 1.3 % IJ SUSP: 10.0000 mL | Freq: Once | INTRAMUSCULAR | Status: DC

## 2022-01-10 MED ORDER — ONDANSETRON HCL 4 MG/2ML IJ SOLN: 4.0000 mg | Freq: Once | INTRAMUSCULAR | Status: DC | PRN

## 2022-01-10 SURGICAL SUPPLY — 44 items
BAG COUNTER SPONGE SURGICOUNT (BAG) IMPLANT
BAG DECANTER FOR FLEXI CONT (MISCELLANEOUS) ×1 IMPLANT
BLADE SAW SGTL 18X1.27X75 (BLADE) ×1 IMPLANT
BOOTIES KNEE HIGH SLOAN (MISCELLANEOUS) ×1 IMPLANT
CELLS DAT CNTRL 66122 CELL SVR (MISCELLANEOUS) ×1 IMPLANT
COVER PERINEAL POST (MISCELLANEOUS) ×1 IMPLANT
COVER SURGICAL LIGHT HANDLE (MISCELLANEOUS) ×1 IMPLANT
CUP SECTOR GRIPTON 50MM (Cup) IMPLANT
DRAPE FOOT SWITCH (DRAPES) ×1 IMPLANT
DRAPE IMP U-DRAPE 54X76 (DRAPES) ×1 IMPLANT
DRAPE STERI IOBAN 125X83 (DRAPES) ×1 IMPLANT
DRAPE U-SHAPE 47X51 STRL (DRAPES) ×2 IMPLANT
DRSG AQUACEL AG ADV 3.5X 6 (GAUZE/BANDAGES/DRESSINGS) ×1 IMPLANT
DRSG AQUACEL AG ADV 3.5X10 (GAUZE/BANDAGES/DRESSINGS) IMPLANT
DURAPREP 26ML APPLICATOR (WOUND CARE) ×1 IMPLANT
ELECT BLADE TIP CTD 4 INCH (ELECTRODE) ×1 IMPLANT
ELECT REM PT RETURN 15FT ADLT (MISCELLANEOUS) ×1 IMPLANT
GLOVE BIO SURGEON STRL SZ8 (GLOVE) ×2 IMPLANT
GLOVE BIOGEL PI IND STRL 8 (GLOVE) ×2 IMPLANT
GOWN STRL REUS W/ TWL XL LVL3 (GOWN DISPOSABLE) ×2 IMPLANT
GOWN STRL REUS W/TWL XL LVL3 (GOWN DISPOSABLE) ×2
HEAD FEMORAL 32 CERAMIC (Hips) IMPLANT
HOLDER FOLEY CATH W/STRAP (MISCELLANEOUS) ×1 IMPLANT
KIT TURNOVER KIT A (KITS) IMPLANT
LINER ACET PNNCL PLUS4 NEUTRAL (Hips) IMPLANT
MANIFOLD NEPTUNE II (INSTRUMENTS) ×1 IMPLANT
NEEDLE HYPO 22GX1.5 SAFETY (NEEDLE) ×1 IMPLANT
NS IRRIG 1000ML POUR BTL (IV SOLUTION) ×1 IMPLANT
PACK ANTERIOR HIP CUSTOM (KITS) ×1 IMPLANT
PENCIL SMOKE EVACUATOR (MISCELLANEOUS) IMPLANT
PINNACLE PLUS 4 NEUTRAL (Hips) ×1 IMPLANT
PROTECTOR NERVE ULNAR (MISCELLANEOUS) ×1 IMPLANT
RETRACTOR WND ALEXIS 18 MED (MISCELLANEOUS) ×1 IMPLANT
RTRCTR WOUND ALEXIS 18CM MED (MISCELLANEOUS) ×1
SPIKE FLUID TRANSFER (MISCELLANEOUS) ×1 IMPLANT
STEM FEM SZ3 STD ACTIS (Stem) IMPLANT
SUT ETHIBOND NAB CT1 #1 30IN (SUTURE) ×2 IMPLANT
SUT VIC AB 1 CT1 36 (SUTURE) ×1 IMPLANT
SUT VIC AB 2-0 CT1 27 (SUTURE) ×1
SUT VIC AB 2-0 CT1 TAPERPNT 27 (SUTURE) ×1 IMPLANT
SUT VICRYL AB 3-0 FS1 BRD 27IN (SUTURE) ×1 IMPLANT
SUT VLOC 180 0 24IN GS25 (SUTURE) ×1 IMPLANT
SYR 50ML LL SCALE MARK (SYRINGE) ×1 IMPLANT
TRAY FOLEY MTR SLVR 16FR STAT (SET/KITS/TRAYS/PACK) ×1 IMPLANT

## 2022-01-10 NOTE — Anesthesia Preprocedure Evaluation (Signed)
Anesthesia Evaluation  Patient identified by MRN, date of birth, ID band Patient awake    Reviewed: Allergy & Precautions, NPO status , Patient's Chart, lab work & pertinent test results  History of Anesthesia Complications (+) PONV and history of anesthetic complications  Airway Mallampati: I  TM Distance: >3 FB Neck ROM: Full    Dental  (+) Dental Advisory Given, Teeth Intact   Pulmonary neg pulmonary ROS, neg shortness of breath, neg sleep apnea, neg COPD, neg recent URI   breath sounds clear to auscultation       Cardiovascular negative cardio ROS Normal cardiovascular exam     Neuro/Psych TIA negative psych ROS   GI/Hepatic negative GI ROS, Neg liver ROS,,,  Endo/Other    Renal/GU negative Renal ROS     Musculoskeletal  (+) Arthritis , Osteoarthritis,    Abdominal Normal abdominal exam  (+)   Peds  Hematology Lab Results      Component                Value               Date                      WBC                      11.4 (H)            12/13/2020                HGB                      10.6 (L)            12/13/2020                HCT                      35.1 (L)            12/13/2020                MCV                      76.8 (L)            12/13/2020                PLT                      400                 12/13/2020              Anesthesia Other Findings Lymphadenopathy  Reproductive/Obstetrics                             Anesthesia Physical Anesthesia Plan  ASA: 2  Anesthesia Plan: Spinal   Post-op Pain Management: Minimal or no pain anticipated   Induction:   PONV Risk Score and Plan: 3 and Propofol infusion, Treatment may vary due to age or medical condition, Ondansetron and Dexamethasone  Airway Management Planned: Nasal Cannula, Natural Airway and Simple Face Mask  Additional Equipment: None  Intra-op Plan:   Post-operative Plan:   Informed Consent:  I have reviewed the patients History and Physical, chart, labs and discussed the procedure including the risks, benefits  and alternatives for the proposed anesthesia with the patient or authorized representative who has indicated his/her understanding and acceptance.       Plan Discussed with: CRNA  Anesthesia Plan Comments:         Anesthesia Quick Evaluation

## 2022-01-10 NOTE — Evaluation (Signed)
Physical Therapy Evaluation Patient Details Name: Ariel [name] MRN: [id number] DOB: [birth year] Today's Date: 01/10/2022  History of Present Illness  Pt is a 66yo female presenting s/p L-THA, AA on 10/11/21. PMH: hx of diffuse large-cell non-Hodgkin's lymphoma with CNS involvement, hx of melanoma, Pancytopenia, hx of TIA 2022.  Clinical Impression  Ariel [name] is a 66 y.o. female POD 0 s/p L-THAAA. Patient reports modified independence with mobility at baseline. Patient is now limited by functional impairments (see PT problem list below) and requires min guard for transfers and gait with RW. Patient was able to ambulate 100 feet with RW and min guard and cues for safe walker management. Patient educated on safe sequencing for stair mobility and verbalized safe guarding position for people assisting with mobility. Patient instructed in exercises to facilitate ROM and circulation. Patient will benefit from continued skilled PT interventions to address impairments and progress towards PLOF. Patient has met mobility goals at adequate level for discharge home; will continue to follow if pt continues acute stay to progress towards Mod I goals.       Recommendations for follow up therapy are one component of a multi-disciplinary discharge planning process, led by the attending physician.  Recommendations may be updated based on patient status, additional functional criteria and insurance authorization.  Follow Up Recommendations Follow physician's recommendations for discharge plan and follow up therapies      Assistance Recommended at Discharge Frequent or constant Supervision/Assistance  Patient can return home with the following  A little help with walking and/or transfers;A little help with bathing/dressing/bathroom;Assistance with cooking/housework;Assist for transportation;Help with stairs or ramp for entrance    Equipment Recommendations None recommended by PT  Recommendations for Other  Services       Functional Status Assessment Patient has had a recent decline in their functional status and demonstrates the ability to make significant improvements in function in a reasonable and predictable amount of time.     Precautions / Restrictions Precautions Precautions: Fall Restrictions Weight Bearing Restrictions: No Other Position/Activity Restrictions: wbat      Mobility  Bed Mobility Overal bed mobility: Modified Independent             General bed mobility comments: Increased time    Transfers Overall transfer level: Needs assistance Equipment used: Rolling walker (2 wheels) Transfers: Sit to/from Stand, Bed to chair/wheelchair/BSC Sit to Stand: Min guard, From elevated surface   Step pivot transfers: Min guard       General transfer comment: Min guard for safety, no physical assist required    Ambulation/Gait Ambulation/Gait assistance: Min guard Gait Distance (Feet): 100 Feet Assistive device: Rolling walker (2 wheels) Gait Pattern/deviations: WFL(Within Functional Limits)       General Gait Details: decreased  Stairs Stairs: Yes Stairs assistance: Min guard Stair Management: One rail Right, Step to pattern, Sideways Number of Stairs: 2 General stair comments: Pt completed stair training wtih min guard, no physical assist required or overt LOB noted  Wheelchair Mobility    Modified Rankin (Stroke Patients Only)       Balance Overall balance assessment: Needs assistance Sitting-balance support: Feet supported, No upper extremity supported Sitting balance-Leahy Scale: Good     Standing balance support: During functional activity, Reliant on assistive device for balance, Bilateral upper extremity supported Standing balance-Leahy Scale: Fair Standing balance comment: Pt able to stand and pull up briefs with only min guard, no UE support  Pertinent Vitals/Pain Pain Assessment Pain  Assessment: 0-10 Pain Score: 5  Pain Location: right hip Pain Descriptors / Indicators: Penetrating, Operative site guarding Pain Intervention(s): Limited activity within patient's tolerance, Monitored during session, Repositioned, Ice applied    Home Living Family/patient expects to be discharged to:: Private residence Living Arrangements: Alone Available Help at Discharge: Family;Available 24 hours/day Type of Home: House Home Access: Stairs to enter Entrance Stairs-Rails: Right Entrance Stairs-Number of Steps: 1   Home Layout: One level Home Equipment: Shower seat;Grab bars - tub/shower;Grab bars - toilet;BSC/3in1;Toilet riser;Rolling Walker (2 wheels);Cane - single point;Rollator (4 wheels)      Prior Function Prior Level of Function : Independent/Modified Independent;Driving             Mobility Comments: Uses RW at night, and SPC most of the times ADLs Comments: IND     Hand Dominance   Dominant Hand: Right    Extremity/Trunk Assessment   Upper Extremity Assessment Upper Extremity Assessment: Overall WFL for tasks assessed    Lower Extremity Assessment Lower Extremity Assessment: RLE deficits/detail;LLE deficits/detail RLE Deficits / Details: MMT ank DF/PF 5/5 RLE Sensation: WNL LLE Deficits / Details: MMT ank DF/PF 5/5 LLE Sensation: WNL    Cervical / Trunk Assessment Cervical / Trunk Assessment: Kyphotic  Communication   Communication: No difficulties  Cognition Arousal/Alertness: Awake/alert Behavior During Therapy: WFL for tasks assessed/performed Overall Cognitive Status: Within Functional Limits for tasks assessed                                          General Comments General comments (skin integrity, edema, etc.): Son [place] present    Exercises Total Joint Exercises Ankle Circles/Pumps: AROM, Both, 10 reps Quad Sets: AROM, Right, Other reps (comment) (2) Short Arc Quad: AROM, Right, Other reps (comment) (3) Heel Slides:  AROM, Right, 5 reps Hip ABduction/ADduction: AROM, Right, 5 reps   Assessment/Plan    PT Assessment Patient needs continued PT services  PT Problem List Decreased strength;Decreased range of motion;Decreased activity tolerance;Decreased balance;Decreased mobility;Decreased coordination;Pain       PT Treatment Interventions DME instruction;Gait training;Stair training;Functional mobility training;Therapeutic activities;Therapeutic exercise;Balance training;Neuromuscular re-education;Patient/family education    PT Goals (Current goals can be found in the Care Plan section)  Acute Rehab PT Goals Patient Stated Goal: Walk without pain PT Goal Formulation: With patient Time For Goal Achievement: 01/17/22 Potential to Achieve Goals: Good    Frequency 7X/week     Co-evaluation               AM-PAC PT "6 Clicks" Mobility  Outcome Measure Help needed turning from your back to your side while in a flat bed without using bedrails?: None Help needed moving from lying on your back to sitting on the side of a flat bed without using bedrails?: A Little Help needed moving to and from a bed to a chair (including a wheelchair)?: A Little Help needed standing up from a chair using your arms (e.g., wheelchair or bedside chair)?: A Little Help needed to walk in hospital room?: A Little Help needed climbing 3-5 steps with a railing? : A Little 6 Click Score: 19    End of Session Equipment Utilized During Treatment: Gait belt Activity Tolerance: Patient tolerated treatment well;No increased pain Patient left: in chair;with call bell/phone within reach Nurse Communication: Mobility status PT Visit Diagnosis: Pain;Difficulty in walking, not elsewhere classified (R26.2) Pain -  Right/Left: Right Pain - part of body: Hip    Time: 2449-7530 PT Time Calculation (min) (ACUTE ONLY): 21 min   Charges:   PT Evaluation $PT Eval Low Complexity: 1 Low          Coolidge Breeze, PT, DPT [facility]  Rehabilitation Department Office: [phone] Weekend pager: [phone]  Coolidge Breeze 01/10/2022, 3:42 PM

## 2022-01-10 NOTE — Interval H&P Note (Signed)
History and Physical Interval Note:  01/10/2022 9:34 AM  Ariel [name]  has presented today for surgery, with the diagnosis of right hip avascular necrosis.  The various methods of treatment have been discussed with the patient and family. After consideration of risks, benefits and other options for treatment, the patient has consented to  Procedure(s): RIGHT TOTAL HIP ARTHROPLASTY ANTERIOR APPROACH (Right) as a surgical intervention.  The patient's history has been reviewed, patient examined, no change in status, stable for surgery.  I have reviewed the patient's chart and labs.  Questions were answered to the patient's satisfaction.     Hessie Dibble

## 2022-01-10 NOTE — Transfer of Care (Signed)
Immediate Anesthesia Transfer of Care Note  Patient: Ariel [name]  Procedure(s) Performed: RIGHT TOTAL HIP ARTHROPLASTY ANTERIOR APPROACH (Right: Hip)  Patient Location: PACU  Anesthesia Type:Regional and Spinal  Level of Consciousness: awake, alert , and oriented  Airway & Oxygen Therapy: Patient Spontanous Breathing and Patient connected to face mask oxygen  Post-op Assessment: Report given to RN and Post -op Vital signs reviewed and stable  Post vital signs: Reviewed and stable  Last Vitals:  Vitals Value Taken Time  BP    Temp    Pulse 82 01/10/22 1223  Resp 14 01/10/22 1223  SpO2 100 % 01/10/22 1223  Vitals shown include unvalidated device data.  Last Pain:  Vitals:   01/10/22 0826  TempSrc:   PainSc: 4       Patients Stated Pain Goal: 3 (20/60/15 6153)  Complications: No notable events documented.

## 2022-01-10 NOTE — Op Note (Addendum)
PRE-OP DIAGNOSIS:  RIGHT HIP AVASCULAR NECROSIS POST-OP DIAGNOSIS:  same PROCEDURE: RIGHT TOTAL HIP ARTHROPLASTY ANTERIOR APPROACH ANESTHESIA:  Spinal and MAC SURGEON:  Melrose Nakayama MD ASSISTANT:  Loni Dolly PA-C   INDICATIONS FOR PROCEDURE:  The patient is a 66 y.o. female with a long history of a painful hip.  This has persisted despite multiple conservative measures.  The patient has persisted with pain and dysfunction making rest and activity difficult.  A total hip replacement is offered as surgical treatment.  Informed operative consent was obtained after discussion of possible complications including reaction to anesthesia, infection, neurovascular injury, dislocation, DVT, PE, and death.  The importance of the postoperative rehab program to optimize result was stressed with the patient.  SUMMARY OF FINDINGS AND PROCEDURE:  Under the above anesthesia through a anterior approach an the Hana table a right THR was performed.  The patient had severe degenerative change and fair bone quality.  She had collapse of the femoral head consistent with AVN. We used DePuy components to replace the hip and these were size 3 Actis femur capped with a +1 80m ceramic hip ball.  On the acetabular side we used a size 50 Gription shell with a  plus 4 neutral polyethylene liner.  We did use a hole eliminator.  ALoni DollyPA-C assisted throughout and was invaluable to the completion of the case in that he helped position and retract while I performed the procedure.  He also closed simultaneously to help minimize OR time.  I used fluoroscopy throughout the case to check position of implants and leg lengths and read all of these views myself.  DESCRIPTION OF PROCEDURE:  The patient was taken to the OR suite where the above anesthetic was applied.  The patient was then positioned on the Hana table supine.  All bony prominences were appropriately padded.  Prep and drape was then performed in normal sterile fashion.   The patient was given kefzol preoperative antibiotic and an appropriate time out was performed.  We then took an anterior approach to the right hip.  Dissection was taken through adipose to the tensor fascia lata fascia.  This structure was incised longitudinally and we dissected in the intermuscular interval just medial to this muscle.  Cobra retractors were placed superior and inferior to the femoral neck superficial to the capsule.  A capsular incision was then made and the retractors were placed along the femoral neck.  Xray was brought in to get a good level for the femoral neck cut which was made with an oscillating saw and osteotome.  The femoral head was removed with a corkscrew.  The acetabulum was exposed and some labral tissues were excised. Reaming was taken to the inside wall of the pelvis and sequentially up to 1 mm smaller than the actual component.  A trial of components was done and then the aforementioned acetabular shell was placed in appropriate tilt and anteversion confirmed by fluoroscopy. The liner was placed along with the hole eliminator and attention was turned to the femur.  The leg was brought down and over into adduction and the elevator bar was used to raise the femur up gently in the wound.  The piriformis was released with care taken to preserve the obturator internus attachment and all of the posterior capsule. The femur was reamed and then broached to the appropriate size.  A trial reduction was done and the aforementioned head and neck assembly gave u[place]athe best stability in extension with external rotation.  Leg lengths were felt to be about equal by fluoroscopic exam.  The trial components were removed and the wound irrigated.  We then placed the femoral component in appropriate anteversion.  The head was applied to a dry stem neck and the hip again reduced.  It was again stable in the aforementioned position.  The would was irrigated again followed by re-approximation of anterior  capsule with ethibond suture. Tensor fascia was repaired with V-loc suture  followed by deep closure with #O and #2 undyed vicryl.  Skin was closed with subQ stitch and steristrips followed by a sterile dressing.  EBL and IOF can be obtained from anesthesia records.  DISPOSITION:  The patient was taken to PACU in stable condition to potentially go home same day depending on ability to walk and tolerate liquids.

## 2022-01-11 ENCOUNTER — Encounter (HOSPITAL_COMMUNITY): Payer: Self-pay | Admitting: Orthopaedic Surgery

## 2022-01-11 NOTE — Anesthesia Postprocedure Evaluation (Signed)
Anesthesia Post Note  Patient: Ariel [name]  Procedure(s) Performed: RIGHT TOTAL HIP ARTHROPLASTY ANTERIOR APPROACH (Right: Hip)     Patient location during evaluation: PACU Anesthesia Type: Spinal Level of consciousness: awake Pain management: pain level controlled Vital Signs Assessment: post-procedure vital signs reviewed and stable Respiratory status: spontaneous breathing Cardiovascular status: stable Postop Assessment: no headache, no backache, spinal receding, patient able to bend at knees and no apparent nausea or vomiting Anesthetic complications: no  No notable events documented.  Last Vitals:  Vitals:   01/10/22 1500 01/10/22 1600  BP: (!) 138/52 (!) 143/78  Pulse: 100 (!) 101  Resp:    Temp:  36.7 C  SpO2: 97% 97%    Last Pain:  Vitals:   01/10/22 1600  TempSrc:   PainSc: 3    Pain Goal: Patients Stated Pain Goal: 3 (01/10/22 1325)                 Huston Foley

## 2022-01-12 DIAGNOSIS — M25551 Pain in right hip: Secondary | ICD-10-CM | POA: Diagnosis not present

## 2022-01-16 ENCOUNTER — Other Ambulatory Visit: Payer: Self-pay

## 2022-01-16 ENCOUNTER — Inpatient Hospital Stay: Payer: Medicare Other | Attending: Internal Medicine

## 2022-01-16 ENCOUNTER — Telehealth: Payer: Self-pay

## 2022-01-16 DIAGNOSIS — C8330 Diffuse large B-cell lymphoma, unspecified site: Secondary | ICD-10-CM | POA: Diagnosis not present

## 2022-01-16 DIAGNOSIS — M25651 Stiffness of right hip, not elsewhere classified: Secondary | ICD-10-CM | POA: Diagnosis not present

## 2022-01-16 DIAGNOSIS — Z96641 Presence of right artificial hip joint: Secondary | ICD-10-CM | POA: Diagnosis not present

## 2022-01-16 DIAGNOSIS — C8510 Unspecified B-cell lymphoma, unspecified site: Secondary | ICD-10-CM

## 2022-01-16 DIAGNOSIS — M6281 Muscle weakness (generalized): Secondary | ICD-10-CM | POA: Diagnosis not present

## 2022-01-16 LAB — CMP (CANCER CENTER ONLY)
ALT: 16 U/L (ref 0–44)
AST: 21 U/L (ref 15–41)
Albumin: 4.5 g/dL (ref 3.5–5.0)
Alkaline Phosphatase: 56 U/L (ref 38–126)
Anion gap: 8 (ref 5–15)
BUN: 13 mg/dL (ref 8–23)
CO2: 32 mmol/L (ref 22–32)
Calcium: 10.3 mg/dL (ref 8.9–10.3)
Chloride: 95 mmol/L — ABNORMAL LOW (ref 98–111)
Creatinine: 0.64 mg/dL (ref 0.44–1.00)
GFR, Estimated: >60 mL/min (ref >60)
Glucose, Bld: 120 mg/dL — ABNORMAL HIGH (ref 70–99)
Potassium: 4.3 mmol/L (ref 3.5–5.1)
Sodium: 135 mmol/L (ref 135–145)
Total Bilirubin: 0.5 mg/dL (ref 0.3–1.2)
Total Protein: 6.8 g/dL (ref 6.5–8.1)

## 2022-01-16 LAB — CBC WITH DIFFERENTIAL (CANCER CENTER ONLY)
Abs Immature Granulocytes: 0.02 10*3/uL (ref 0.00–0.07)
Basophils Absolute: 0 10*3/uL (ref 0.0–0.1)
Basophils Relative: 0 %
Eosinophils Absolute: 0 10*3/uL (ref 0.0–0.5)
Eosinophils Relative: 0 %
HCT: 30.6 % — ABNORMAL LOW (ref 36.0–46.0)
Hemoglobin: 10.2 g/dL — ABNORMAL LOW (ref 12.0–15.0)
Immature Granulocytes: 0 %
Lymphocytes Relative: 23 %
Lymphs Abs: 1.2 10*3/uL (ref 0.7–4.0)
MCH: 32.6 pg (ref 26.0–34.0)
MCHC: 33.3 g/dL (ref 30.0–36.0)
MCV: 97.8 fL (ref 80.0–100.0)
Monocytes Absolute: 0.5 10*3/uL (ref 0.1–1.0)
Monocytes Relative: 10 %
Neutro Abs: 3.3 10*3/uL (ref 1.7–7.7)
Neutrophils Relative %: 67 %
Platelet Count: 148 10*3/uL — ABNORMAL LOW (ref 150–400)
RBC: 3.13 MIL/uL — ABNORMAL LOW (ref 3.87–5.11)
RDW: 12.7 % (ref 11.5–15.5)
WBC Count: 5.1 10*3/uL (ref 4.0–10.5)
nRBC: 0 % (ref 0.0–0.2)

## 2022-01-16 LAB — SAMPLE TO BLOOD BANK

## 2022-01-16 NOTE — Telephone Encounter (Signed)
Patient called and stated she was feeling weak and tired s/p her surgery and concerned her hemoglobin was dropping. Pt stated she wanted to check her labs before the holiday weekend. Pt able to come in and have labs drawn. Lab results reviewed with Dr. Marin Olp and at this time patient does not any transfusion. Pt called by this RN and informed that her labs look good and hemoglobin was 10.2 Pt stated "this is music to my ears" Pt appreciative of call and had no further questions or concerns. Pt aware to call with any questions. Pt verbalized understanding.

## 2022-01-24 DIAGNOSIS — M6281 Muscle weakness (generalized): Secondary | ICD-10-CM | POA: Diagnosis not present

## 2022-01-24 DIAGNOSIS — Z96641 Presence of right artificial hip joint: Secondary | ICD-10-CM | POA: Diagnosis not present

## 2022-01-24 DIAGNOSIS — M25651 Stiffness of right hip, not elsewhere classified: Secondary | ICD-10-CM | POA: Diagnosis not present

## 2022-01-26 DIAGNOSIS — M25651 Stiffness of right hip, not elsewhere classified: Secondary | ICD-10-CM | POA: Diagnosis not present

## 2022-01-26 DIAGNOSIS — M6281 Muscle weakness (generalized): Secondary | ICD-10-CM | POA: Diagnosis not present

## 2022-01-26 DIAGNOSIS — Z96641 Presence of right artificial hip joint: Secondary | ICD-10-CM | POA: Diagnosis not present

## 2022-01-31 DIAGNOSIS — M6281 Muscle weakness (generalized): Secondary | ICD-10-CM | POA: Diagnosis not present

## 2022-01-31 DIAGNOSIS — Z96641 Presence of right artificial hip joint: Secondary | ICD-10-CM | POA: Diagnosis not present

## 2022-01-31 DIAGNOSIS — M25651 Stiffness of right hip, not elsewhere classified: Secondary | ICD-10-CM | POA: Diagnosis not present

## 2022-02-01 ENCOUNTER — Encounter: Payer: Self-pay | Admitting: Hematology & Oncology

## 2022-02-01 ENCOUNTER — Other Ambulatory Visit (HOSPITAL_COMMUNITY): Payer: Medicare Other

## 2022-02-06 ENCOUNTER — Other Ambulatory Visit (HOSPITAL_COMMUNITY): Payer: Medicare Other

## 2022-02-06 ENCOUNTER — Encounter (HOSPITAL_COMMUNITY): Payer: Medicare Other

## 2022-02-08 ENCOUNTER — Encounter: Payer: Self-pay | Admitting: *Deleted

## 2022-02-08 DIAGNOSIS — M170 Bilateral primary osteoarthritis of knee: Secondary | ICD-10-CM | POA: Diagnosis not present

## 2022-02-08 DIAGNOSIS — M1711 Unilateral primary osteoarthritis, right knee: Secondary | ICD-10-CM | POA: Diagnosis not present

## 2022-02-08 DIAGNOSIS — M1712 Unilateral primary osteoarthritis, left knee: Secondary | ICD-10-CM | POA: Diagnosis not present

## 2022-02-08 NOTE — Progress Notes (Signed)
Patient was scheduled for PET today, however she rescheduled it for 02/23/2022. Her follow up with Dr Marin Olp is scheduled for 02/14/2022.  Message sent to scheduling to reschedule patient to date after the PET so that results will be available.   Oncology Nurse Navigator Documentation     02/08/2022   11:15 AM  Oncology Nurse Navigator Flowsheets  Navigator Follow Up Date: 02/13/2022  Navigator Follow Up Reason: Scan Review  Navigator Location [facility]  Navigator Encounter Type Scan Review;Appt/Treatment Plan Review  Patient Visit Type MedOnc  Treatment Phase Post-Tx Follow-up  Barriers/Navigation Needs No Barriers At This Time  Interventions Coordination of Care  Acuity Level 1-No Barriers  Coordination of Care Appts  Support Groups/Services Friends and Family  Time Spent with Patient 15

## 2022-02-13 ENCOUNTER — Ambulatory Visit (HOSPITAL_COMMUNITY)
Admission: RE | Admit: 2022-02-13 | Discharge: 2022-02-13 | Disposition: A | Discharge status: Home / Self Care | Payer: Medicare Other | Source: Ambulatory Visit | Attending: Hematology & Oncology | Admitting: Hematology & Oncology

## 2022-02-13 DIAGNOSIS — C8510 Unspecified B-cell lymphoma, unspecified site: Secondary | ICD-10-CM | POA: Diagnosis not present

## 2022-02-13 DIAGNOSIS — G9389 Other specified disorders of brain: Secondary | ICD-10-CM | POA: Diagnosis not present

## 2022-02-13 MED ORDER — GADOBUTROL 1 MMOL/ML IV SOLN
7.0000 mL | Freq: Once | INTRAVENOUS | Status: AC | PRN
  Administered 2022-02-13: 7 mL via INTRAVENOUS

## 2022-02-14 ENCOUNTER — Encounter: Payer: Self-pay | Admitting: *Deleted

## 2022-02-14 ENCOUNTER — Other Ambulatory Visit: Payer: Medicare Other

## 2022-02-14 ENCOUNTER — Encounter: Payer: Self-pay | Admitting: Hematology & Oncology

## 2022-02-14 ENCOUNTER — Other Ambulatory Visit: Payer: Self-pay

## 2022-02-14 ENCOUNTER — Inpatient Hospital Stay: Payer: Medicare Other | Attending: Internal Medicine | Admitting: Hematology & Oncology

## 2022-02-14 ENCOUNTER — Ambulatory Visit: Payer: Medicare Other | Admitting: Hematology & Oncology

## 2022-02-14 VITALS — BP 139/73 | HR 120 | Temp 98.7°F | Resp 18 | Ht 66.0 in | Wt 152.0 lb

## 2022-02-14 DIAGNOSIS — C8510 Unspecified B-cell lymphoma, unspecified site: Secondary | ICD-10-CM | POA: Diagnosis not present

## 2022-02-14 DIAGNOSIS — Z9221 Personal history of antineoplastic chemotherapy: Secondary | ICD-10-CM | POA: Insufficient documentation

## 2022-02-14 DIAGNOSIS — C8330 Diffuse large B-cell lymphoma, unspecified site: Secondary | ICD-10-CM | POA: Diagnosis not present

## 2022-02-14 DIAGNOSIS — D509 Iron deficiency anemia, unspecified: Secondary | ICD-10-CM | POA: Insufficient documentation

## 2022-02-14 NOTE — Progress Notes (Signed)
MRI reviewed which shows new/recurrent disease. Result shared with Dr Marin Olp.  Oncology Nurse Navigator Documentation     02/14/2022    9:00 AM  Oncology Nurse Navigator Flowsheets  Navigator Follow Up Date: 02/23/2022  Navigator Follow Up Reason: Scan Review  Navigator Location [facility]  Navigator Encounter Type Scan Review  Patient Visit Type MedOnc  Treatment Phase Post-Tx Follow-up  Barriers/Navigation Needs No Barriers At This Time  Interventions Coordination of Care  Acuity Level 1-No Barriers  Coordination of Care Other  Support Groups/Services Friends and Family  Time Spent with Patient 15

## 2022-02-14 NOTE — Progress Notes (Signed)
Hematology and Oncology Follow Up Visit  Ariel [name] [id number] 09-28-1955 66 y.o. 02/14/2022   Principle Diagnosis:  Diffuse large cell non-Hodgkin's lymphoma-CNS involvement  Current Therapy:   Status post chemotherapy with R-ICE/R-MTV --  s/p cycle #4 MATRIX -- s/p cycle #4 -- start on 05/02/2021 --completed on 08/06/2021 Neulasta 6 mg subcu post chemotherapy     Interim History:  Ariel [name] is comes in for an early follow-up.  Unfortunately, looks like we already have problems with recurrent disease.  She had MRI of the brain which was just part of routine restaging.  She had involvement of Ariel left cerebellar peduncle.  There is also involvement of the left frontal operculum.  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  She has no headache.  She has no dizziness.  She has no visual changes.  There is no double vision.  She is due for a PET scan next week.  She found out that Ariel [name] is having another child.  This will be a boy.  Who will be born in April.  I am so happy for Ariel for the great news.  She has been eating well.  There is been no change in bowel or bladder habits.  She has had no cough or shortness of breath.  She has had no nausea or vomiting.  There has been a little numbness in the right flank area.  She has had bilateral hip surgery because of avascular necrosis.  The first was on the left hip in August.  She had the second surgery in November on the right hip.  She is getting around with a cane by doing pretty well.  She has had no rashes.  She has had no pruritus.  Currently, I would say performance status is probably ECOG 0.     Medications:  Current Outpatient Medications:    acetaminophen (TYLENOL) 500 MG tablet, Take 500-1,000 mg by mouth every 6 (six) hours as needed (for pain.)., Disp: , Rfl:    aspirin EC 81 MG tablet, Take 1 tablet (81 mg total) by mouth 2 (two) times daily after a meal. For 2 weeks then once  a day for 2 weeks for dvt prevention, Disp: 45 tablet, Rfl: 0   Misc Natural Products (PRO NUTRIENTS FRUIT & VEGGIE PO), Take 4 capsules by mouth in the morning. Balance of Nature Fruit and Vegetable Supplements (2) Fruit + (2) Vegetable, Disp: , Rfl:    Multiple Vitamins-Minerals (MULTIVITAMIN WITH MINERALS) tablet, Take 1 tablet by mouth daily., Disp: , Rfl:    tiZANidine (ZANAFLEX) 4 MG tablet, Take 1 tablet (4 mg total) by mouth every 6 (six) hours as needed for muscle spasms., Disp: 40 tablet, Rfl: 0   traMADol (ULTRAM) 50 MG tablet, Take 1 tablet (50 mg total) by mouth every 6 (six) hours as needed for moderate pain or severe pain (post op pain)., Disp: 20 tablet, Rfl: 0 No current facility-administered medications for this visit.  Facility-Administered Medications Ordered in Other Visits:    sodium chloride flush (NS) 0.9 % injection 10 mL, 10 mL, Intravenous, PRN, Volanda Napoleon, MD, 10 mL at 10/04/21 0946  Allergies:  Allergies  Allergen Reactions   Decadron [Dexamethasone] Other (See Comments)    Made the patient's legs tired and she could not walk. Hallucinations   Plavix [Clopidogrel] Hives, Swelling and Other (See Comments)    Lips became swollen    Prednisone Other (See Comments)  Steroids caused psychological issues  Hallucinations   Codeine Nausea And Vomiting    Past Medical History, Surgical history, Social history, and Family History were reviewed and updated.  Review of Systems: Review of Systems  Constitutional: Negative.   HENT:  Negative.    Eyes: Negative.   Respiratory: Negative.    Cardiovascular: Negative.   Gastrointestinal: Negative.   Endocrine: Negative.   Genitourinary: Negative.    Musculoskeletal: Negative.   Skin: Negative.   Neurological: Negative.   Hematological: Negative.   Psychiatric/Behavioral: Negative.      Physical Exam:  height is '5\' 6"'$  (1.676 m) and weight is 152 lb (68.9 kg). Ariel oral temperature is 98.7 F (37.1 C). Ariel  blood pressure is 139/73 and Ariel pulse is 120 (abnormal). Ariel respiration is 18 and oxygen saturation is 100%.   Wt Readings from Last 3 Encounters:  02/14/22 152 lb (68.9 kg)  01/10/22 151 lb 0.2 oz (68.5 kg)  01/05/22 151 lb (68.5 kg)    Physical Exam Vitals reviewed.  HENT:     Head: Normocephalic and atraumatic.  Eyes:     Pupils: Pupils are equal, round, and reactive to light.  Cardiovascular:     Rate and Rhythm: Normal rate and regular rhythm.     Heart sounds: Normal heart sounds.     Comments: Cardiac exam is regular rate and rhythm with no murmurs, rubs or bruits. Pulmonary:     Effort: Pulmonary effort is normal.     Breath sounds: Normal breath sounds.  Abdominal:     General: Bowel sounds are normal.     Palpations: Abdomen is soft.  Musculoskeletal:        General: No tenderness or deformity. Normal range of motion.     Cervical back: Normal range of motion.     Comments: .  Lymphadenopathy:     Cervical: No cervical adenopathy.  Skin:    General: Skin is warm and dry.     Findings: No erythema or rash.  Neurological:     Mental Status: She is alert and oriented to person, place, and time.     Comments: Neurological exam shows no focal neurological deficits.  All cranial nerves are intact.  She has good strength in upper and lower extremities.  Psychiatric:        Behavior: Behavior normal.        Thought Content: Thought content normal.        Judgment: Judgment normal.      Lab Results  Component Value Date   WBC 5.1 01/16/2022   HGB 10.2 (L) 01/16/2022   HCT 30.6 (L) 01/16/2022   MCV 97.8 01/16/2022   PLT 148 (L) 01/16/2022     Chemistry      Component Value Date/Time   NA 135 01/16/2022 1117   K 4.3 01/16/2022 1117   CL 95 (L) 01/16/2022 1117   CO2 32 01/16/2022 1117   BUN 13 01/16/2022 1117   CREATININE 0.64 01/16/2022 1117   CREATININE 0.68 11/18/2012 1439      Component Value Date/Time   CALCIUM 10.3 01/16/2022 1117   ALKPHOS 56  01/16/2022 1117   AST 21 01/16/2022 1117   ALT 16 01/16/2022 1117   BILITOT 0.5 01/16/2022 1117      Impression and Plan: Ariel [name] is a very charming 66 year old white female.  She initially came to [place] with iron deficiency anemia.  We subsequently found that she had diffuse large cell non-Hodgkin's lymphoma.  It had  traveled to Ariel brain.  She has had a total of 8 cycles of treatment.  She had 4 cycles of a combination protocol.  We then gave Ariel 4 cycles of MATRIX.  Treatment was completed in June.  I am just very surprised and disappointed that she has had recurrence.  The PET scan will certainly be very helpful.  I do think that she is in very good shape.  I think that she would be a candidate for CAR-T therapy.  I will have to see about making referral to one of the academic medical centers to see if they would feel that she would be a candidate.  Again, a PET scan will certainly be helpful to see if she has any systemic recurrence.  She really tolerated therapy well for Ariel lymphoma.  Again we gave Ariel incredibly aggressive treatment.  I really thought that we would get a lot more "mileage" out of treatment and that she would have a progression free interval of hopefully more than a year.  Again, she is in good shape.  As such, I think we need to be aggressive and see about making referral to an academic [facility].  I will see about one of the specialists at [facility] in [place].  She does not want to tell Ariel family what is going on until after Christmas.  I agree with this.  She does not want to upset there were Christmas plans.  We will plan to get Ariel back here to see [place] depending on what we find with the PET scan and with the referral.  Volanda Napoleon, MD 12/19/20231:54 PM

## 2022-02-21 ENCOUNTER — Encounter (HOSPITAL_BASED_OUTPATIENT_CLINIC_OR_DEPARTMENT_OTHER): Payer: Self-pay | Admitting: Emergency Medicine

## 2022-02-21 ENCOUNTER — Telehealth: Payer: Self-pay | Admitting: *Deleted

## 2022-02-21 ENCOUNTER — Emergency Department (HOSPITAL_BASED_OUTPATIENT_CLINIC_OR_DEPARTMENT_OTHER)
Admission: EM | Admit: 2022-02-21 | Discharge: 2022-02-21 | Disposition: A | Discharge status: Home / Self Care | Payer: Medicare Other | Attending: Emergency Medicine | Admitting: Emergency Medicine

## 2022-02-21 ENCOUNTER — Emergency Department (HOSPITAL_BASED_OUTPATIENT_CLINIC_OR_DEPARTMENT_OTHER): Payer: Medicare Other

## 2022-02-21 DIAGNOSIS — R112 Nausea with vomiting, unspecified: Secondary | ICD-10-CM | POA: Diagnosis not present

## 2022-02-21 DIAGNOSIS — I700 Atherosclerosis of aorta: Secondary | ICD-10-CM | POA: Diagnosis not present

## 2022-02-21 DIAGNOSIS — Z7982 Long term (current) use of aspirin: Secondary | ICD-10-CM | POA: Diagnosis not present

## 2022-02-21 DIAGNOSIS — G936 Cerebral edema: Secondary | ICD-10-CM | POA: Diagnosis not present

## 2022-02-21 DIAGNOSIS — Z85820 Personal history of malignant melanoma of skin: Secondary | ICD-10-CM | POA: Insufficient documentation

## 2022-02-21 DIAGNOSIS — C8590 Non-Hodgkin lymphoma, unspecified, unspecified site: Secondary | ICD-10-CM | POA: Diagnosis not present

## 2022-02-21 DIAGNOSIS — R519 Headache, unspecified: Secondary | ICD-10-CM | POA: Diagnosis present

## 2022-02-21 DIAGNOSIS — Z8572 Personal history of non-Hodgkin lymphomas: Secondary | ICD-10-CM | POA: Diagnosis not present

## 2022-02-21 DIAGNOSIS — U071 COVID-19: Secondary | ICD-10-CM | POA: Diagnosis not present

## 2022-02-21 DIAGNOSIS — I672 Cerebral atherosclerosis: Secondary | ICD-10-CM | POA: Diagnosis not present

## 2022-02-21 DIAGNOSIS — R531 Weakness: Secondary | ICD-10-CM | POA: Diagnosis not present

## 2022-02-21 LAB — CBC WITH DIFFERENTIAL/PLATELET
Abs Immature Granulocytes: 0.03 10*3/uL (ref 0.00–0.07)
Basophils Absolute: 0 10*3/uL (ref 0.0–0.1)
Basophils Relative: 0 %
Eosinophils Absolute: 0 10*3/uL (ref 0.0–0.5)
Eosinophils Relative: 0 %
HCT: 37.3 % (ref 36.0–46.0)
Hemoglobin: 12.8 g/dL (ref 12.0–15.0)
Immature Granulocytes: 1 %
Lymphocytes Relative: 13 %
Lymphs Abs: 0.7 10*3/uL (ref 0.7–4.0)
MCH: 32.5 pg (ref 26.0–34.0)
MCHC: 34.3 g/dL (ref 30.0–36.0)
MCV: 94.7 fL (ref 80.0–100.0)
Monocytes Absolute: 0.5 10*3/uL (ref 0.1–1.0)
Monocytes Relative: 9 %
Neutro Abs: 4.6 10*3/uL (ref 1.7–7.7)
Neutrophils Relative %: 77 %
Platelets: 180 10*3/uL (ref 150–400)
RBC: 3.94 MIL/uL (ref 3.87–5.11)
RDW: 12.6 % (ref 11.5–15.5)
WBC: 5.9 10*3/uL (ref 4.0–10.5)
nRBC: 0 % (ref 0.0–0.2)

## 2022-02-21 LAB — COMPREHENSIVE METABOLIC PANEL
ALT: 26 U/L (ref 0–44)
AST: 25 U/L (ref 15–41)
Albumin: 4.2 g/dL (ref 3.5–5.0)
Alkaline Phosphatase: 65 U/L (ref 38–126)
Anion gap: 9 (ref 5–15)
BUN: 18 mg/dL (ref 8–23)
CO2: 27 mmol/L (ref 22–32)
Calcium: 10 mg/dL (ref 8.9–10.3)
Chloride: 99 mmol/L (ref 98–111)
Creatinine, Ser: 0.54 mg/dL (ref 0.44–1.00)
GFR, Estimated: >60 mL/min (ref >60)
Glucose, Bld: 135 mg/dL — ABNORMAL HIGH (ref 70–99)
Potassium: 3.7 mmol/L (ref 3.5–5.1)
Sodium: 135 mmol/L (ref 135–145)
Total Bilirubin: 0.7 mg/dL (ref 0.3–1.2)
Total Protein: 6.9 g/dL (ref 6.5–8.1)

## 2022-02-21 LAB — LIPASE, BLOOD: Lipase: 39 U/L (ref 11–51)

## 2022-02-21 LAB — RESP PANEL BY RT-PCR (RSV, FLU A&B, COVID)  RVPGX2
Influenza A by PCR: NEGATIVE
Influenza B by PCR: NEGATIVE
Resp Syncytial Virus by PCR: NEGATIVE
SARS Coronavirus 2 by RT PCR: POSITIVE — AB

## 2022-02-21 LAB — LACTATE DEHYDROGENASE: LDH: 200 U/L — ABNORMAL HIGH (ref 98–192)

## 2022-02-21 LAB — PHOSPHORUS: Phosphorus: 2.8 mg/dL (ref 2.5–4.6)

## 2022-02-21 LAB — TROPONIN I (HIGH SENSITIVITY): Troponin I (High Sensitivity): 4 ng/L (ref <18)

## 2022-02-21 LAB — MAGNESIUM: Magnesium: 2 mg/dL (ref 1.7–2.4)

## 2022-02-21 LAB — URIC ACID: Uric Acid, Serum: 3.6 mg/dL (ref 2.5–7.1)

## 2022-02-21 MED ORDER — DEXAMETHASONE 4 MG PO TABS: 4.0000 mg | ORAL_TABLET | Freq: Every day | ORAL | 0 refills | Status: AC

## 2022-02-21 MED ORDER — IOHEXOL 300 MG/ML  SOLN
100.0000 mL | Freq: Once | INTRAMUSCULAR | Status: AC | PRN
  Administered 2022-02-21: 100 mL via INTRAVENOUS

## 2022-02-21 MED ORDER — ONDANSETRON HCL 4 MG PO TABS: 4.0000 mg | ORAL_TABLET | Freq: Four times a day (QID) | ORAL | 0 refills | Status: DC

## 2022-02-21 MED ORDER — METOCLOPRAMIDE HCL 5 MG/ML IJ SOLN
10.0000 mg | INTRAMUSCULAR | Status: AC
  Administered 2022-02-21: 10 mg via INTRAVENOUS
  Filled 2022-02-21: qty 2

## 2022-02-21 MED ORDER — ONDANSETRON HCL 4 MG/2ML IJ SOLN
4.0000 mg | Freq: Once | INTRAMUSCULAR | Status: AC
  Administered 2022-02-21: 4 mg via INTRAVENOUS
  Filled 2022-02-21: qty 2

## 2022-02-21 MED ORDER — NIRMATRELVIR/RITONAVIR (PAXLOVID)TABLET: 3.0000 | ORAL_TABLET | Freq: Two times a day (BID) | ORAL | 0 refills | Status: AC

## 2022-02-21 MED ORDER — DEXAMETHASONE SODIUM PHOSPHATE 4 MG/ML IJ SOLN
4.0000 mg | Freq: Once | INTRAMUSCULAR | Status: AC
  Administered 2022-02-21: 4 mg via INTRAVENOUS
  Filled 2022-02-21: qty 1

## 2022-02-21 MED ORDER — HEPARIN SOD (PORK) LOCK FLUSH 100 UNIT/ML IV SOLN
500.0000 [IU] | Freq: Once | INTRAVENOUS | Status: AC
  Administered 2022-02-21: 500 [IU]
  Filled 2022-02-21: qty 5

## 2022-02-21 MED ORDER — LACTATED RINGERS IV BOLUS
1000.0000 mL | Freq: Once | INTRAVENOUS | Status: AC
  Administered 2022-02-21: 1000 mL via INTRAVENOUS

## 2022-02-21 NOTE — ED Provider Notes (Signed)
[facility] EMERGENCY DEPARTMENT Provider Note   CSN: [id number] Arrival date & time: 02/21/22  0707     History {Add pertinent medical, surgical, social history, OB history to HPI:1} Chief Complaint  Patient presents with   Weakness   Emesis    Ariel [name] is a 66 y.o. female.  66 year old female with a history of non-Hodgkin's lymphoma with CNS involvement, iron deficiency anemia, and melanoma who presents to the emergency department with nausea and vomiting.  Patient states that on Sunday she started experiencing nausea that is progressed.  Also had 3 episodes of green emesis yesterday.  Had 1 episode of emesis today but denies any abdominal pain.  Says that she has felt generally weak as well.  Has an appointment with Dr. Marin Olp today but feels that she needs IV fluids so came into the emergency department for management.  Reportedly has a PET scan scheduled for this week.  Also reports a dull headache that has been present since this started.  No new medications that she feels could be causing her nausea and vomiting.  No recent cancer treatment (ended chemo in the summer).  Denies any changes in her bowel habits, fevers, shortness of breath, dysuria or frequency.  Did have cough last week which she attributes to a cold.  Abdominal surgeries include appendectomy and hernia repair.       Home Medications Prior to Admission medications   Medication Sig Start Date End Date Taking? Authorizing Provider  acetaminophen (TYLENOL) 500 MG tablet Take 500-1,000 mg by mouth every 6 (six) hours as needed (for pain.).    [provider]  aspirin EC 81 MG tablet Take 1 tablet (81 mg total) by mouth 2 (two) times daily after a meal. For 2 weeks then once a day for 2 weeks for dvt prevention 01/10/22 01/10/23  Loni Dolly, PA-C  Misc Natural Products (PRO NUTRIENTS FRUIT & VEGGIE PO) Take 4 capsules by mouth in the morning. Balance of Nature Fruit and Vegetable Supplements  (2) Fruit + (2) Vegetable    [provider]  Multiple Vitamins-Minerals (MULTIVITAMIN WITH MINERALS) tablet Take 1 tablet by mouth daily. 09/19/21   [name], NP  tiZANidine (ZANAFLEX) 4 MG tablet Take 1 tablet (4 mg total) by mouth every 6 (six) hours as needed for muscle spasms. 01/10/22 01/10/23  Loni Dolly, PA-C  traMADol (ULTRAM) 50 MG tablet Take 1 tablet (50 mg total) by mouth every 6 (six) hours as needed for moderate pain or severe pain (post op pain). 01/10/22 01/10/23  Loni Dolly, PA-C      Allergies    Decadron [dexamethasone], Plavix [clopidogrel], Prednisone, and Codeine    Review of Systems   Review of Systems  Physical Exam Updated Vital Signs BP (!) 145/72   Pulse 96   Ht '5\' 7"'$  (1.702 m)   Wt 68 kg   SpO2 100%   BMI 23.49 kg/m  Physical Exam Vitals and nursing note reviewed.  Constitutional:      General: She is not in acute distress.    Appearance: She is well-developed.  HENT:     Head: Normocephalic and atraumatic.     Right Ear: External ear normal.     Left Ear: External ear normal.     Nose: Nose normal.     Mouth/Throat:     Mouth: Mucous membranes are moist.     Pharynx: Oropharynx is clear.  Eyes:     Extraocular Movements: Extraocular movements intact.  Conjunctiva/sclera: Conjunctivae normal.     Pupils: Pupils are equal, round, and reactive to light.  Cardiovascular:     Rate and Rhythm: Normal rate and regular rhythm.  Pulmonary:     Effort: Pulmonary effort is normal. No respiratory distress.  Abdominal:     General: Abdomen is flat. There is no distension.     Palpations: Abdomen is soft. There is no mass.     Tenderness: There is no abdominal tenderness. There is no guarding.  Musculoskeletal:     Cervical back: Normal range of motion and neck supple.     Right lower leg: No edema.     Left lower leg: No edema.  Skin:    General: Skin is warm and dry.     Capillary Refill: Capillary refill takes 2 to 3  seconds.  Neurological:     Mental Status: She is alert and oriented to person, place, and time. Mental status is at baseline.  Psychiatric:        Mood and Affect: Mood normal.     ED Results / Procedures / Treatments   Labs (all labs ordered are listed, but only abnormal results are displayed) Labs Reviewed - No data to display  EKG EKG Interpretation  Date/Time:  Tuesday February 21 2022 07:32:02 EST Ventricular Rate:  89 PR Interval:  135 QRS Duration: 80 QT Interval:  358 QTC Calculation: 436 R Axis:   77 Text Interpretation: Baseline wander noted Sinus rhythm Biatrial enlargement Minimal ST depression, inferior leads When compared to 12/08/20 no significant change Confirmed by Margaretmary Eddy [phone]) on 02/21/2022 7:37:27 AM  Radiology No results found.  Procedures Procedures  {Document cardiac monitor, telemetry assessment procedure when appropriate:1}  Medications Ordered in ED Medications - No data to display  ED Course/ Medical Decision Making/ A&P                           Medical Decision Making Amount and/or Complexity of Data Reviewed Labs: ordered.  Risk Prescription drug management.   ***  {Document critical care time when appropriate:1} {Document review of labs and clinical decision tools ie heart score, Chads2Vasc2 etc:1}  {Document your independent review of radiology images, and any outside records:1} {Document your discussion with family members, caretakers, and with consultants:1} {Document social determinants of health affecting pt's care:1} {Document your decision making why or why not admission, treatments were needed:1} Final Clinical Impression(s) / ED Diagnoses Final diagnoses:  None    Rx / DC Orders ED Discharge Orders     None

## 2022-02-21 NOTE — Telephone Encounter (Signed)
Call received from pt stating that she was diagnosed with Covid today and will need to move out her PET scan scheduled for 02/23/22.  Call placed to central scheduling and they stated that PET scan would need to be moved out by ten days and earliest available appt for PET is 03/10/22.  Call placed to patient to notify her of new PET scan date.

## 2022-02-21 NOTE — Discharge Instructions (Addendum)
You were seen for your nausea and vomiting in the emergency department.  You were found to have COVID-19.  At home, please take Zofran as needed for nausea and vomiting.  Take the Paxlovid for your COVID.  And please take the Decadron for the swelling in your brain from the non-Hodgkin's lymphoma.  Stop taking this medication and contact your oncologist if you develop any concerning symptoms with the Decadron.    Follow-up with your primary doctor in 2-3 days regarding your visit.  Follow-up with Dr. Marin Olp tomorrow.  He would like for you to try to get your PET scan this week.  Return immediately to the emergency department if you experience any of the following: Vomiting despite the medication, severe headache, or any other concerning symptoms.    Thank you for visiting our Emergency Department. It was a pleasure taking care of you today.

## 2022-02-21 NOTE — ED Triage Notes (Signed)
Patient presents C/O generalized weakness, N/V onset Saturday. Hx of lymphoma, no active chemo at this time.

## 2022-02-21 NOTE — ED Notes (Signed)
Patient transported to CT 

## 2022-02-23 ENCOUNTER — Encounter (HOSPITAL_COMMUNITY): Payer: Medicare Other

## 2022-02-24 ENCOUNTER — Other Ambulatory Visit: Payer: Self-pay | Admitting: *Deleted

## 2022-02-24 DIAGNOSIS — C8332 Diffuse large B-cell lymphoma, intrathoracic lymph nodes: Secondary | ICD-10-CM

## 2022-02-24 DIAGNOSIS — C8510 Unspecified B-cell lymphoma, unspecified site: Secondary | ICD-10-CM

## 2022-02-24 DIAGNOSIS — D696 Thrombocytopenia, unspecified: Secondary | ICD-10-CM

## 2022-02-24 DIAGNOSIS — D649 Anemia, unspecified: Secondary | ICD-10-CM

## 2022-02-24 DIAGNOSIS — Z95828 Presence of other vascular implants and grafts: Secondary | ICD-10-CM

## 2022-02-24 DIAGNOSIS — D500 Iron deficiency anemia secondary to blood loss (chronic): Secondary | ICD-10-CM

## 2022-02-24 DIAGNOSIS — C8330 Diffuse large B-cell lymphoma, unspecified site: Secondary | ICD-10-CM

## 2022-02-24 MED ORDER — ONDANSETRON HCL 8 MG PO TABS: 8.0000 mg | ORAL_TABLET | Freq: Three times a day (TID) | ORAL | 1 refills | Status: DC | PRN

## 2022-02-28 ENCOUNTER — Encounter: Payer: Self-pay | Admitting: *Deleted

## 2022-02-28 ENCOUNTER — Telehealth: Payer: Self-pay | Admitting: Radiation Oncology

## 2022-02-28 NOTE — Progress Notes (Signed)
Patient was diagnosed with COVID and PET was rescheduled to 03/10/2022. Dr Marin Olp still would like to see her on 03/01/22.  Oncology Nurse Navigator Documentation     02/28/2022    9:30 AM  Oncology Nurse Navigator Flowsheets  Navigator Follow Up Date: 03/01/2022  Navigator Follow Up Reason: Follow-up Appointment  Navigator Location [facility]  Navigator Encounter Type Appt/Treatment Plan Review  Patient Visit Type MedOnc  Treatment Phase Post-Tx Follow-up  Barriers/Navigation Needs No Barriers At This Time  Interventions None Required  Acuity Level 1-No Barriers  Support Groups/Services Friends and Family  Time Spent with Patient 15

## 2022-02-28 NOTE — Telephone Encounter (Signed)
LVM to schedule CON with Dr. Kinard 

## 2022-03-01 ENCOUNTER — Inpatient Hospital Stay: Payer: Medicare Other

## 2022-03-01 ENCOUNTER — Inpatient Hospital Stay: Payer: Medicare Other | Attending: Internal Medicine

## 2022-03-01 ENCOUNTER — Inpatient Hospital Stay: Payer: Medicare Other | Admitting: Hematology & Oncology

## 2022-03-01 ENCOUNTER — Other Ambulatory Visit: Payer: Self-pay

## 2022-03-01 VITALS — HR 82 | Temp 98.0°F | Resp 18 | Ht 67.0 in | Wt 148.0 lb

## 2022-03-01 DIAGNOSIS — C8510 Unspecified B-cell lymphoma, unspecified site: Secondary | ICD-10-CM

## 2022-03-01 DIAGNOSIS — C8339 Diffuse large B-cell lymphoma, extranodal and solid organ sites: Secondary | ICD-10-CM | POA: Diagnosis not present

## 2022-03-01 DIAGNOSIS — Z95828 Presence of other vascular implants and grafts: Secondary | ICD-10-CM

## 2022-03-01 DIAGNOSIS — Z9221 Personal history of antineoplastic chemotherapy: Secondary | ICD-10-CM | POA: Insufficient documentation

## 2022-03-01 LAB — FERRITIN: Ferritin: 1329 ng/mL — ABNORMAL HIGH (ref 11–307)

## 2022-03-01 LAB — CBC WITH DIFFERENTIAL (CANCER CENTER ONLY)
Abs Immature Granulocytes: 0.02 10*3/uL (ref 0.00–0.07)
Basophils Absolute: 0 10*3/uL (ref 0.0–0.1)
Basophils Relative: 0 %
Eosinophils Absolute: 0 10*3/uL (ref 0.0–0.5)
Eosinophils Relative: 1 %
HCT: 36 % (ref 36.0–46.0)
Hemoglobin: 12.4 g/dL (ref 12.0–15.0)
Immature Granulocytes: 1 %
Lymphocytes Relative: 29 %
Lymphs Abs: 1.2 10*3/uL (ref 0.7–4.0)
MCH: 33 pg (ref 26.0–34.0)
MCHC: 34.4 g/dL (ref 30.0–36.0)
MCV: 95.7 fL (ref 80.0–100.0)
Monocytes Absolute: 0.5 10*3/uL (ref 0.1–1.0)
Monocytes Relative: 12 %
Neutro Abs: 2.4 10*3/uL (ref 1.7–7.7)
Neutrophils Relative %: 57 %
Platelet Count: 166 10*3/uL (ref 150–400)
RBC: 3.76 MIL/uL — ABNORMAL LOW (ref 3.87–5.11)
RDW: 12.4 % (ref 11.5–15.5)
WBC Count: 4.2 10*3/uL (ref 4.0–10.5)
nRBC: 0 % (ref 0.0–0.2)

## 2022-03-01 LAB — CMP (CANCER CENTER ONLY)
ALT: 22 U/L (ref 0–44)
AST: 17 U/L (ref 15–41)
Albumin: 4.7 g/dL (ref 3.5–5.0)
Alkaline Phosphatase: 61 U/L (ref 38–126)
Anion gap: 9 (ref 5–15)
BUN: 12 mg/dL (ref 8–23)
CO2: 30 mmol/L (ref 22–32)
Calcium: 10.1 mg/dL (ref 8.9–10.3)
Chloride: 98 mmol/L (ref 98–111)
Creatinine: 0.62 mg/dL (ref 0.44–1.00)
GFR, Estimated: >60 mL/min (ref >60)
Glucose, Bld: 117 mg/dL — ABNORMAL HIGH (ref 70–99)
Potassium: 4 mmol/L (ref 3.5–5.1)
Sodium: 137 mmol/L (ref 135–145)
Total Bilirubin: 0.4 mg/dL (ref 0.3–1.2)
Total Protein: 7.2 g/dL (ref 6.5–8.1)

## 2022-03-01 LAB — LACTATE DEHYDROGENASE: LDH: 155 U/L (ref 98–192)

## 2022-03-01 MED ORDER — HEPARIN SOD (PORK) LOCK FLUSH 100 UNIT/ML IV SOLN
500.0000 [IU] | Freq: Once | INTRAVENOUS | Status: AC
  Administered 2022-03-01: 500 [IU] via INTRAVENOUS

## 2022-03-01 MED ORDER — SODIUM CHLORIDE 0.9% FLUSH
10.0000 mL | Freq: Once | INTRAVENOUS | Status: AC
  Administered 2022-03-01: 10 mL via INTRAVENOUS

## 2022-03-01 MED ORDER — DEXAMETHASONE 4 MG PO TABS: 4.0000 mg | ORAL_TABLET | Freq: Every day | ORAL | 2 refills | Status: DC

## 2022-03-01 MED ORDER — PANTOPRAZOLE SODIUM 40 MG PO TBEC: 40.0000 mg | DELAYED_RELEASE_TABLET | Freq: Every day | ORAL | 3 refills | Status: DC

## 2022-03-01 NOTE — Patient Instructions (Signed)

## 2022-03-01 NOTE — Progress Notes (Signed)
Hematology and Oncology Follow Up Visit  Ariel [name] [id number] 1956-01-01 67 y.o. 03/01/2022   Principle Diagnosis:  Diffuse large cell non-Hodgkin's lymphoma-CNS involvement  Current Therapy:   Status post chemotherapy with R-ICE/R-MTV --  s/p cycle #4 MATRIX -- s/p cycle #4 -- start on 05/02/2021 --completed on 08/06/2021 Neulasta 6 mg subcu post chemotherapy     Interim History:  Ariel [name] is comes in for follow-up.  Unfortunately, we are not looking at Ariel [name] recurrence in the brain.  This is of Ariel [name] lymphoma.  Ariel [name] had COVID recently.  As such Ariel [name] cannot have Ariel [name] PET scan done.  This has been rescheduled for January 12.  Ariel [name] has little bit of dizziness.  Ariel [name] is little bit of a headache.  We are going to have to get Ariel [name] on low-dose steroids.  I know that Ariel [name] has had some steroid problems in the past.  However, I think 4 mg should be okay for Ariel [name] to tolerate.  Ariel [name] we will start Radiation Therapy hopefully next week.  Ariel [name] will see them tomorrow.    I would still like Ariel [name] to consider CAR-T therapy.  I think this would be a very good option for Ariel [name] given that Ariel [name] is in great shape.  We will try Ariel [name] on some steroids.  I will start Ariel [name] on low-dose steroids at 4 mg a day.  I think this will help with some of the swelling that might be going on with these tumors.  I will also make sure that Ariel [name] is on an antiacid.  There has been no problems with fever.  Again Ariel [name] had COVID but has gotten over this.  Ariel [name] has had no bleeding.  There is been no change in bowel or bladder habits.  Ariel [name] has had no mouth sores.  Ariel [name] has had no headache.  Overall, Ariel [name] performance status is ECOG 1.   Medications:  Current Outpatient Medications:    acetaminophen (TYLENOL) 500 MG tablet, Take 500-1,000 mg by mouth every 6 (six) hours as needed (for pain.)., Disp: , Rfl:    aspirin EC 81 MG tablet, Take 1 tablet (81 mg total) by mouth 2 (two) times daily after a meal. For 2 weeks then once a day for 2 weeks for dvt  prevention, Disp: 45 tablet, Rfl: 0   Misc Natural Products (PRO NUTRIENTS FRUIT & VEGGIE PO), Take 4 capsules by mouth in the morning. Balance of Nature Fruit and Vegetable Supplements (2) Fruit + (2) Vegetable, Disp: , Rfl:    Multiple Vitamins-Minerals (MULTIVITAMIN WITH MINERALS) tablet, Take 1 tablet by mouth daily., Disp: , Rfl:    ondansetron (ZOFRAN) 8 MG tablet, Take 1 tablet (8 mg total) by mouth every 8 (eight) hours as needed for nausea or vomiting., Disp: 20 tablet, Rfl: 1   tiZANidine (ZANAFLEX) 4 MG tablet, Take 1 tablet (4 mg total) by mouth every 6 (six) hours as needed for muscle spasms., Disp: 40 tablet, Rfl: 0   traMADol (ULTRAM) 50 MG tablet, Take 1 tablet (50 mg total) by mouth every 6 (six) hours as needed for moderate pain or severe pain (post op pain)., Disp: 20 tablet, Rfl: 0 No current facility-administered medications for this visit.  Facility-Administered Medications Ordered in Other Visits:    sodium chloride flush (NS) 0.9 % injection 10 mL, 10 mL, Intravenous, PRN, Volanda Napoleon, MD, 10 mL at 10/04/21 0946  Allergies:  Allergies  Allergen Reactions   Decadron [Dexamethasone] Other (See Comments)    Made the patient's  legs tired and Ariel [name] could not walk. Hallucinations   Plavix [Clopidogrel] Hives, Swelling and Other (See Comments)    Lips became swollen    Prednisone Other (See Comments)    Steroids caused psychological issues  Hallucinations   Codeine Nausea And Vomiting    Past Medical History, Surgical history, Social history, and Family History were reviewed and updated.  Review of Systems: Review of Systems  Constitutional: Negative.   HENT:  Negative.    Eyes: Negative.   Respiratory: Negative.    Cardiovascular: Negative.   Gastrointestinal: Negative.   Endocrine: Negative.   Genitourinary: Negative.    Musculoskeletal: Negative.   Skin: Negative.   Neurological: Negative.   Hematological: Negative.   Psychiatric/Behavioral: Negative.       Physical Exam:  height is '5\' 7"'$  (1.702 m) and weight is 148 lb (67.1 kg). Ariel [name] oral temperature is 98 F (36.7 C). Ariel [name] pulse is 82. Ariel [name] respiration is 18 and oxygen saturation is 98%.   Wt Readings from Last 3 Encounters:  03/01/22 148 lb (67.1 kg)  02/21/22 150 lb (68 kg)  02/14/22 152 lb (68.9 kg)    Physical Exam Vitals reviewed.  HENT:     Head: Normocephalic and atraumatic.  Eyes:     Pupils: Pupils are equal, round, and reactive to light.  Cardiovascular:     Rate and Rhythm: Normal rate and regular rhythm.     Heart sounds: Normal heart sounds.     Comments: Cardiac exam is regular rate and rhythm with no murmurs, rubs or bruits. Pulmonary:     Effort: Pulmonary effort is normal.     Breath sounds: Normal breath sounds.  Abdominal:     General: Bowel sounds are normal.     Palpations: Abdomen is soft.  Musculoskeletal:        General: No tenderness or deformity. Normal range of motion.     Cervical back: Normal range of motion.     Comments: .  Lymphadenopathy:     Cervical: No cervical adenopathy.  Skin:    General: Skin is warm and dry.     Findings: No erythema or rash.  Neurological:     Mental Status: Ariel [name] is alert and oriented to person, place, and time.     Comments: Neurological exam shows no focal neurological deficits.  All cranial nerves are intact.  Ariel [name] has good strength in upper and lower extremities.  Psychiatric:        Behavior: Behavior normal.        Thought Content: Thought content normal.        Judgment: Judgment normal.      Lab Results  Component Value Date   WBC 4.2 03/01/2022   HGB 12.4 03/01/2022   HCT 36.0 03/01/2022   MCV 95.7 03/01/2022   PLT 166 03/01/2022     Chemistry      Component Value Date/Time   NA 137 03/01/2022 1517   K 4.0 03/01/2022 1517   CL 98 03/01/2022 1517   CO2 30 03/01/2022 1517   BUN 12 03/01/2022 1517   CREATININE 0.62 03/01/2022 1517   CREATININE 0.68 11/18/2012 1439      Component Value  Date/Time   CALCIUM 10.1 03/01/2022 1517   ALKPHOS 61 03/01/2022 1517   AST 17 03/01/2022 1517   ALT 22 03/01/2022 1517   BILITOT 0.4 03/01/2022 1517      Impression and Plan: Ariel [name] is a very charming 67 year old white female.  Ariel [name] initially came  to [place] with iron deficiency anemia.  We subsequently found that Ariel [name] had diffuse large cell non-Hodgkin's lymphoma.  It had traveled to Ariel [name] brain.  Ariel [name] has had a total of 8 cycles of treatment.  Ariel [name] had 4 cycles of a combination protocol.  We then gave Ariel [name] 4 cycles of MATRIX.  Treatment was completed in June.  I am just very surprised and disappointed that Ariel [name] has had recurrence.  The PET scan will certainly be very helpful.  Again, Radiation Oncology will be able to help Ariel [name] out.  I do not know if this radiation can be done with stereotactic radiosurgery.  When Ariel [name] completes this, then we will see about making referral to one of the academic centers for the likelihood of CAR-T therapy.  I am just happy that Ariel [name] quality life is good.  I want to try to improve this if we can.  I would like to see Ariel [name] back in 1 month.  At that point, we will see how Ariel [name] is feeling.  I would not think that we would do any type of follow-up MRI probably for a good 6 weeks after treatment.  Ariel [name] still has Ariel [name] Port-A-Cath and so we have to make sure that we flush this.    Volanda Napoleon, MD 1/3/20243:50 PM

## 2022-03-02 ENCOUNTER — Encounter: Payer: Self-pay | Admitting: *Deleted

## 2022-03-02 LAB — IRON AND IRON BINDING CAPACITY (CC-WL,HP ONLY)
Iron: 39 ug/dL (ref 28–170)
Saturation Ratios: 15 % (ref 10.4–31.8)
TIBC: 265 ug/dL (ref 250–450)
UIBC: 226 ug/dL (ref 148–442)

## 2022-03-02 NOTE — Progress Notes (Signed)
Patient here to discuss treatment option now that her disease has recurred. She will see radiation today. After this is complete, Dr Marin Olp will refer to local CAR-T program for consideration of treatment.   Oncology Nurse Navigator Documentation     03/02/2022    8:00 AM  Oncology Nurse Navigator Flowsheets  Confirmed Diagnosis Date 02/13/2022  Diagnosis Status Recurrent  Navigator Follow Up Date: 03/10/2022  Navigator Follow Up Reason: Scan Review  Navigator Location [facility]  Navigator Encounter Type Appt/Treatment Plan Review  Patient Visit Type MedOnc  Treatment Phase Post-Tx Follow-up  Barriers/Navigation Needs No Barriers At This Time  Interventions None Required  Acuity Level 1-No Barriers  Support Groups/Services Friends and Family  Time Spent with Patient 15

## 2022-03-02 NOTE — Progress Notes (Signed)
Location/Histology of Brain Tumor:  Diffuse large cell non-Hodgkin's lymphoma-CNS involvement  Ct Of Head 02/21/22  Brain: Abnormal edema evident within the left middle cerebellar peduncle and left cerebellum consistent with recurrent disease shown by MRI. Small region of edema in the left frontal operculum also consistent with recurrent disease as shown by prior MRI. Chronic encephalomalacia of the posterior left temporal lobe as seen previously. No change when compared to the recent MRI. No hydrocephalus, hemorrhage or extra-axial collection. Very slight hyperdensity in the left cerebellar region can be seen with lymphoma involvement.   Vascular: There is atherosclerotic calcification of the major vessels at the base of the brain.   Skull: Negative   Sinuses/Orbits: Clear/normal   Other: None   IMPRESSION: 1. Abnormal edema in the left middle cerebellar peduncle, left cerebellum and left frontal operculum consistent with recurrent disease as shown by MRI. No change when compared to the recent MRI. 2. Chronic encephalomalacia of the posterior left temporal lobe as seen previously. 3. Very slight hyperdensity in the left cerebellar region can be seen with lymphoma involvement.       Brain MRI  02/13/22  FINDINGS: Brain: There is recurrent disease since the prior exam. There is involvement of the left cerebellar peduncle with edema and abnormal enhancement. No involvement of this location has been seen previously. The right cerebral hemisphere remains negative. On the left, encephalomalacia of the posterior temporal lobe related to a previously treated lesion is unchanged without recurrent enhancement in that location. There is new/recurrent disease affecting the left frontal operculum with edema and enhancement. This is patchy and multifocal. No other foci of involvement are demonstrated. Chronic scattered foci of hemosiderin deposition remain visible in the left cerebral  hemisphere, unchanged. No hydrocephalus. No extra-axial collection.   Vascular: Flow   Skull and upper cervical spine: Negative   Sinuses/Orbits: Clear/normal   Other: None   IMPRESSION: 1. New/recurrent disease affecting the left cerebellar peduncle and the left frontal operculum as outlined above. 2. Stable encephalomalacia of the posterior temporal lobe related to a previously treated lesion. No evidence of recurrent disease in that location. 3. 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 Past or anticipated interventions, if any, per neurosurgery:   Past or anticipated interventions, if any, per medical oncology:  Status post chemotherapy with R-ICE/R-MTV --  s/p cycle #4 MATRIX -- s/p cycle #4 -- start on 05/02/2021 --completed on 08/06/2021 Neulasta 6 mg subcu post chemotherapy Status post chemotherapy with R-ICE/R-MTV --  s/p cycle #4 MATRIX -- s/p cycle #4 -- start on 05/02/2021 --completed on 08/06/2021 Neulasta 6 mg subcu post chemotherapy Dose of Decadron, if applicable: will start her on low-dose steroids at 4 mg a day. I think this will help with some of the swelling that might be going on with these tumors.   Recent neurologic symptoms, if any:  Seizures: no Headaches: yes,sometime  Nausea: yes,has medication Dizziness/ataxia: yes,when she bend-over Difficulty with hand coordination: no Focal numbness/weakness: yes,some weakness Visual deficits/changes: yes,states that her rt eye vision has changed. States that she had a blood clot in that eye one year ago. Confusion/Memory deficits: no   SAFETY ISSUES: Prior radiation? no Pacemaker/ICD? no Possible current pregnancy? no Is the patient on methotrexate? no  Additional Complaints / other details: Pain in her rt leg due to surgery.Reports  dizziness when she bend -over . Reports having a lot of nausea . States that she has medication for the nausea. Vitals:   03/06/22 1236  BP:  132/76  Pulse: 98  Resp: 17  Temp: 98.2 F (36.8 C)  TempSrc: Oral  SpO2: 99%  Weight: 64.9 kg

## 2022-03-05 NOTE — Progress Notes (Signed)
Radiation Oncology         [phone] ________________________________  Initial Outpatient Consultation  Name: Ariel [name] MRN: [id number]  Date: 03/06/2022  DOB: [birth year]  CC:O'Sullivan, Lenna Sciara, NP  Volanda Napoleon, MD   REFERRING PHYSICIAN: Volanda Napoleon, MD  DIAGNOSIS: The primary encounter diagnosis was Abdominal lymphadenopathy. Diagnoses of High grade B-cell lymphoma ([place]) and Malignant neoplasm metastatic to brain [facility]) were also pertinent to this visit.  Diffuse large cell non-Hodgkin's lymphoma-CNS involvement  History of stage I melanoma s/p resection in August 2021   HISTORY OF PRESENT ILLNESS::Ariel [name] is a 67 y.o. female who is accompanied by her cousin. she is seen as a courtesy of Dr. Marin Olp for an opinion concerning radiation therapy as part of management for her recently diagnosed brain metastases from diffuse large cell non-Hodgkin's lymphoma primary.   The patient initially presented to the ED on 12/08/2020 with acute onset of confusion, speech deficit and inability to follow commands. MRI of the brain performed at that time revealed 2 enhancing lesions in the left cerebral hemisphere. The largest of which measuring up to 1.9 cm x 1.9 cm x 2.2 cm located in the posterior temporal lobe. Surrounding edema was also appreciated, greater in the left temporal lobe, but without midline shift.  She was accordingly admitted and received IV Keppra and Decadron.  Her case was presented at the tumor board who recommended biopsy prior to stereotactic radiosurgery (as recommended by neurosurgery and rad-onc). However, radiosurgery was ultimately not pursed at that time.   CT of the chest abdomen and pelvis also performed while inpatient showed prominent lymphadenopathy in the gastrohepatic ligament and celiac axis, and well as retroperitoneal lymphadenopathy.  A 17 mm right adrenal nodule was also appreciated.   GI was consulted and the patient underwent an EUS while  inpatient on 12/14/2020.  FNA of a retroperitoneal lymph node showed findings consistent with high-grade B-cell lymphoma. A stomach biopsy was also collected and showed gastric mucosa with hyperemia and no evidence of h-pylori.   PET scan on 12/27/21 showed extensive adenopathy in the chest, abdomen, and pelvis.   Accordingly, the patient met with Dr. Marin Olp who recommended chemotherapy with R-ICE (the patient was already established with Dr. Marin Olp for management of iron deficiency anemia). She received her first cycle on 01/04/2021. Her treatment course was complicated due to psychological issues secondary to steroids. She was subsequently admitted on 01/14/22 and treated with a R-MTV regimen (per Dr. Marin Olp) to focus on her CNS lymphoma issue.  MRI's of the brain and CT AP performed while inpatient showed near complete resolution of lymphoma.   Given her good response to both RICE and R-MTV  (with steroid cessation), the patient was started on an alternating regimen of RICE and R-MTV. She required admission for chemo infusions and completed her 4th and final cycle of RICE/R-MTV in February 2023.   She was then started on MATRIX protocol on 05/02/21 (also required admission for infusions). She tolerated hybrid treatment well other than pancytopenia, and completed her 4th and final cycle on 08/01/21.   Follow-up MRI of the brain on 09/28/21 showed no evidence of active intracranial disease. Restaging PET scan also performed on 08/02 showed no hypermetabolic adenopathy above or below the diaphragm suggestive of recurrent lymphoma.  In recent history, the patient presented for a routine follow-up MRI of the brain on 02/13/22 which unfortunately demonstrated evidence of new/recurrent disease affecting the left cerebellar peduncle and the left frontal operculum. MRI also showed stable post-treatment  changes in the posterior temporal lobe (encephalomalacia) at the site of the previously treated lesion.    She soon after presented to the ED on 02/21/22 with nausea and vomiting x several days, headaches, and generalized weakness. CT of the head performed showed abnormal edema in the left middle cerebellar peduncle, left cerebellum, and left frontal operculum, consistent with recurrent disease as demonstrated on her most recent MRI. CT also showed a very slight hyperdensity in the left cerebellar region, which is sometimes seen with lymphoma involvement.   CT CAP also performed in the ED showed no definite findings to suggest recurrent lymphoma in the chest, abdomen, or pelvis.  Moving forward, Dr. Marin Olp will work on referring her to an academic center (likely [facility]) for consideration of CAR-T therapy. This will be preceded by radiation therapy which we will discuss in detail today.    PREVIOUS RADIATION THERAPY: No  PAST MEDICAL HISTORY:  Past Medical History:  Diagnosis Date   Arthritis    Chicken pox    Complication of anesthesia    Per patient, very slow to wake up after anesthesia   Goals of care, counseling/discussion 12/22/2020   High cholesterol    High grade B-cell lymphoma ([place]) 12/22/2020   Hyperglycemia 11/10/2020   Melanoma ([place]) 2021   right hip   Melanoma ([place]) 10/28/2020   pt stated brain liver and bladder   PONV (postoperative nausea and vomiting)    Urinary incontinence     PAST SURGICAL HISTORY: Past Surgical History:  Procedure Laterality Date   AIR/FLUID EXCHANGE Right 12/07/2020   Procedure: AIR/FLUID EXCHANGE;  Surgeon: Jalene Mullet, MD;  Location: [place];  Service: Ophthalmology;  Laterality: Right;   APPENDECTOMY  1962   BIOPSY  12/14/2020   Procedure: BIOPSY;  Surgeon: Rush Landmark Telford Nab., MD;  Location: [place];  Service: Gastroenterology;;   ESOPHAGOGASTRODUODENOSCOPY (EGD) WITH PROPOFOL N/A 12/14/2020   Procedure: ESOPHAGOGASTRODUODENOSCOPY (EGD) WITH PROPOFOL;  Surgeon: Irving Copas., MD;  Location: [place];  Service:  Gastroenterology;  Laterality: N/A;   EXCISION MELANOMA WITH SENTINEL LYMPH NODE BIOPSY Right 10/09/2019   Procedure: WIDE LOCAL EXCISION WITH ADVANCEMENT FLAP CLOSURE RIGHT HIP MELANOMA WITH SENTINEL LYMPH NODE BIOPSY;  Surgeon: Stark Klein, MD;  Location: [place];  Service: General;  Laterality: Right;   FINE NEEDLE ASPIRATION  12/14/2020   Procedure: FINE NEEDLE ASPIRATION (FNA) LINEAR;  Surgeon: Irving Copas., MD;  Location: [place];  Service: Gastroenterology;;   HERNIA REPAIR  2009   IR IMAGING GUIDED PORT INSERTION  12/30/2020   MOUTH SURGERY  11/2015   gum surgery and bone implant   PARS PLANA VITRECTOMY Right 12/07/2020   Procedure: PARS PLANA VITRECTOMY WITH 25 GAUGE;  Surgeon: Jalene Mullet, MD;  Location: [place];  Service: Ophthalmology;  Laterality: Right;   PHOTOCOAGULATION WITH LASER Right 12/07/2020   Procedure: PHOTOCOAGULATION WITH ENDOLASER PANRITINAL COAGULATION;  Surgeon: Jalene Mullet, MD;  Location: [place];  Service: Ophthalmology;  Laterality: Right;   TOTAL HIP ARTHROPLASTY Left 10/11/2021   Procedure: LEFT TOTAL HIP ARTHROPLASTY ANTERIOR APPROACH;  Surgeon: Melrose Nakayama, MD;  Location: [facility];  Service: Orthopedics;  Laterality: Left;   TOTAL HIP ARTHROPLASTY Right 01/10/2022   Procedure: RIGHT TOTAL HIP ARTHROPLASTY ANTERIOR APPROACH;  Surgeon: Melrose Nakayama, MD;  Location: [facility];  Service: Orthopedics;  Laterality: Right;   UPPER ESOPHAGEAL ENDOSCOPIC ULTRASOUND (EUS) N/A 12/14/2020   Procedure: UPPER ESOPHAGEAL ENDOSCOPIC ULTRASOUND (EUS);  Surgeon: Irving Copas., MD;  Location: [place];  Service: Gastroenterology;  Laterality: N/A;   WISDOM TOOTH EXTRACTION      FAMILY HISTORY:  Family History  Problem Relation Age of Onset   Coronary artery disease Father        died at 50   COPD Father    Alcohol abuse Father    Diabetes Mellitus II Father    Coronary artery disease Sister    Cancer Sister        breast   Heart attack  Brother    Hyperlipidemia Other        died 64- "natural causes"   Hyperlipidemia Other        4 siblings    Hypertension Other        3 siblings    SOCIAL HISTORY:  Social History   Tobacco Use   Smoking status: Never   Smokeless tobacco: Never  Vaping Use   Vaping Use: Never used  Substance Use Topics   Alcohol use: Never   Drug use: Never    ALLERGIES:  Allergies  Allergen Reactions   Decadron [Dexamethasone] Other (See Comments)    Made the patient's legs tired and she could not walk. Hallucinations   Plavix [Clopidogrel] Hives, Swelling and Other (See Comments)    Lips became swollen    Prednisone Other (See Comments)    Steroids caused psychological issues  Hallucinations   Codeine Nausea And Vomiting    MEDICATIONS:  Current Outpatient Medications  Medication Sig Dispense Refill   acetaminophen (TYLENOL) 500 MG tablet Take 500-1,000 mg by mouth every 6 (six) hours as needed (for pain.).     aspirin EC 81 MG tablet Take 1 tablet (81 mg total) by mouth 2 (two) times daily after a meal. For 2 weeks then once a day for 2 weeks for dvt prevention 45 tablet 0   dexamethasone (DECADRON) 4 MG tablet Take 1 tablet (4 mg total) by mouth daily. Please make sure you take the dexamethasone with food. (Patient not taking: Reported on 03/06/2022) 30 tablet 2   Misc Natural Products (PRO NUTRIENTS FRUIT & VEGGIE PO) Take 4 capsules by mouth in the morning. Balance of Nature Fruit and Vegetable Supplements (2) Fruit + (2) Vegetable (Patient not taking: Reported on 03/06/2022)     Multiple Vitamins-Minerals (MULTIVITAMIN WITH MINERALS) tablet Take 1 tablet by mouth daily. (Patient not taking: Reported on 03/06/2022)     ondansetron (ZOFRAN) 8 MG tablet Take 1 tablet (8 mg total) by mouth every 8 (eight) hours as needed for nausea or vomiting. (Patient not taking: Reported on 03/06/2022) 20 tablet 1   pantoprazole (PROTONIX) 40 MG tablet Take 1 tablet (40 mg total) by mouth daily. (Patient  not taking: Reported on 03/06/2022) 30 tablet 3   tiZANidine (ZANAFLEX) 4 MG tablet Take 1 tablet (4 mg total) by mouth every 6 (six) hours as needed for muscle spasms. (Patient not taking: Reported on 03/06/2022) 40 tablet 0   traMADol (ULTRAM) 50 MG tablet Take 1 tablet (50 mg total) by mouth every 6 (six) hours as needed for moderate pain or severe pain (post op pain). (Patient not taking: Reported on 03/06/2022) 20 tablet 0   No current facility-administered medications for this encounter.   Facility-Administered Medications Ordered in Other Encounters  Medication Dose Route Frequency Provider Last Rate Last Admin   sodium chloride flush (NS) 0.9 % injection 10 mL  10 mL Intravenous PRN Volanda Napoleon, MD   10 mL at 10/04/21 0946    REVIEW OF SYSTEMS:  A  10+ POINT REVIEW OF SYSTEMS WAS OBTAINED including neurology, dermatology, psychiatry, cardiac, respiratory, lymph, extremities, GI, GU, musculoskeletal, constitutional, reproductive, HEENT.  She reports problems with dizziness and arrives in wheelchair today.  She also reports significant problems with nausea.  She does have Zofran for this issue.  She reports having difficulty keeping Decadron down in light of her nausea.  She denies any numbness or weakness in her extremities.  PHYSICAL EXAM:  weight is 143 lb (64.9 kg). Her oral temperature is 98.2 F (36.8 C). Her blood pressure is 132/76 and her pulse is 98. Her respiration is 17 and oxygen saturation is 99%.   General: Alert and oriented, in no acute distress, she remains lying flat on the examination table in light of her nausea and dizziness.  She was given a 4 mg Zofran tablet which has helped with her nausea. HEENT: Head is normocephalic. Extraocular movements are intact. Oropharynx is clear.  Lungs midline Neck: Neck is supple, no palpable cervical or supraclavicular lymphadenopathy. Heart: Regular in rate and rhythm with no murmurs, rubs, or gallops. Chest: Clear to auscultation  bilaterally, with no rhonchi, wheezes, or rales. Abdomen: Soft, nontender, nondistended, with no rigidity or guarding. Extremities: No cyanosis or edema. Lymphatics: see Neck Exam Skin: No concerning lesions. Musculoskeletal: symmetric strength and muscle tone throughout. Neurologic: Cranial nerves II through XII are grossly intact. No obvious focalities. Speech is fluent. Coordination is intact. Psychiatric: Judgment and insight are intact. Affect is appropriate.   ECOG = 2  0 - Asymptomatic (Fully active, able to carry on all predisease activities without restriction)  1 - Symptomatic but completely ambulatory (Restricted in physically strenuous activity but ambulatory and able to carry out work of a light or sedentary nature. For example, light housework, office work)  2 - Symptomatic, <50% in bed during the day (Ambulatory and capable of all self care but unable to carry out any work activities. Up and about more than 50% of waking hours)  3 - Symptomatic, >50% in bed, but not bedbound (Capable of only limited self-care, confined to bed or chair 50% or more of waking hours)  4 - Bedbound (Completely disabled. Cannot carry on any self-care. Totally confined to bed or chair)  5 - Death   Eustace Pen MM, Creech RH, Tormey DC, et al. [phone]). "Toxicity and response criteria of the [facility]". [place] Oncol. 5 (6): 649-55  LABORATORY DATA:  Lab Results  Component Value Date   WBC 4.2 03/01/2022   HGB 12.4 03/01/2022   HCT 36.0 03/01/2022   MCV 95.7 03/01/2022   PLT 166 03/01/2022   NEUTROABS 2.4 03/01/2022   Lab Results  Component Value Date   NA 137 03/01/2022   K 4.0 03/01/2022   CL 98 03/01/2022   CO2 30 03/01/2022   GLUCOSE 117 (H) 03/01/2022   BUN 12 03/01/2022   CREATININE 0.62 03/01/2022   CALCIUM 10.1 03/01/2022      RADIOGRAPHY: CT CHEST ABDOMEN PELVIS W CONTRAST  Result Date: 02/21/2022 CLINICAL DATA:  67 year old female with history  of generalized weakness. History of lymphoma. * Tracking Code: BO * EXAM: CT CHEST, ABDOMEN, AND PELVIS WITH CONTRAST TECHNIQUE: Multidetector CT imaging of the chest, abdomen and pelvis was performed following the standard protocol during bolus administration of intravenous contrast. RADIATION DOSE REDUCTION: This exam was performed according to the departmental dose-optimization program which includes automated exposure control, adjustment of the mA and/or kV according to patient size and/or use of iterative reconstruction  technique. CONTRAST:  161m OMNIPAQUE IOHEXOL 300 MG/ML  SOLN COMPARISON:  CT of the chest, abdomen and pelvis 02/08/2021. PET-CT 09/28/2021. FINDINGS: CT CHEST FINDINGS Cardiovascular: Heart size is normal. There is no significant pericardial fluid, thickening or pericardial calcification. Atherosclerotic calcifications in the thoracic aorta. No definite coronary artery calcifications. Right internal jugular single-lumen Port-A-Cath with tip terminating in the right atrium. Mediastinum/Nodes: No pathologically enlarged mediastinal or hilar lymph nodes. Esophagus is unremarkable in appearance. No axillary lymphadenopathy. Lungs/Pleura: No acute consolidative airspace disease. No pleural effusions. No suspicious appearing pulmonary nodules or masses are noted. Musculoskeletal: There are no aggressive appearing lytic or blastic lesions noted in the visualized portions of the skeleton. CT ABDOMEN PELVIS FINDINGS Hepatobiliary: No suspicious cystic or solid hepatic lesions. No intra or extrahepatic biliary ductal dilatation. Gallbladder is normal in appearance. Pancreas: No pancreatic mass. No pancreatic ductal dilatation. No pancreatic or peripancreatic fluid collections or inflammatory changes. Spleen: Unremarkable. Adrenals/Urinary Tract: Right adrenal nodule measuring 2.3 x 1.5 cm, slightly larger than prior examinations (previously 1.7 x 1.3 cm on prior CT examination 12/09/2020), but  previously not hypermetabolic on PET-CT 016/11/9602 Bilateral kidneys and left adrenal gland are normal in appearance. No hydroureteronephrosis. Urinary bladder is normal in appearance. Stomach/Bowel: The appearance of the stomach is normal. There is no pathologic dilatation of small bowel or colon. The appendix is not confidently identified and may be surgically absent. Regardless, there are no inflammatory changes noted adjacent to the cecum to suggest the presence of an acute appendicitis at this time. Vascular/Lymphatic: Aortic atherosclerosis. No lymphadenopathy noted in the abdomen or pelvis. Reproductive: Uterus and ovaries are unremarkable in appearance. Other: No significant volume of ascites.  No pneumoperitoneum. Musculoskeletal: Status post bilateral hip arthroplasty. There are no aggressive appearing lytic or blastic lesions noted in the visualized portions of the skeleton. IMPRESSION: 1. No definite findings to suggest recurrent lymphoma in the chest, abdomen or pelvis. 2. The patient has had a right adrenal nodule for some time. This has shown some slight interval growth, but was previously not hypermetabolic on prior PET-CT. This is nonspecific, but favored to be benign. Close attention on follow-up studies is recommended. 3. Aortic atherosclerosis. 4. Additional incidental findings, as above. Electronically Signed   By: DVinnie LangtonM.D.   On: 02/21/2022 09:54   CT Head Wo Contrast  Result Date: 02/21/2022 CLINICAL DATA:  Brain metastases suspected.  History of lymphoma. EXAM: CT HEAD WITHOUT CONTRAST TECHNIQUE: Contiguous axial images were obtained from the base of the skull through the vertex without intravenous contrast. RADIATION DOSE REDUCTION: This exam was performed according to the departmental dose-optimization program which includes automated exposure control, adjustment of the mA and/or kV according to patient size and/or use of iterative reconstruction technique. COMPARISON:   MRI 02/13/2022 FINDINGS: Brain: Abnormal edema evident within the left middle cerebellar peduncle and left cerebellum consistent with recurrent disease shown by MRI. Small region of edema in the left frontal operculum also consistent with recurrent disease as shown by prior MRI. Chronic encephalomalacia of the posterior left temporal lobe as seen previously. No change when compared to the recent MRI. No hydrocephalus, hemorrhage or extra-axial collection. Very slight hyperdensity in the left cerebellar region can be seen with lymphoma involvement. Vascular: There is atherosclerotic calcification of the major vessels at the base of the brain. Skull: Negative Sinuses/Orbits: Clear/normal Other: None IMPRESSION: 1. Abnormal edema in the left middle cerebellar peduncle, left cerebellum and left frontal operculum consistent with recurrent disease as shown by MRI. No  change when compared to the recent MRI. 2. Chronic encephalomalacia of the posterior left temporal lobe as seen previously. 3. Very slight hyperdensity in the left cerebellar region can be seen with lymphoma involvement. Electronically Signed   By: Nelson Chimes M.D.   On: 02/21/2022 09:41   MR Brain W Wo Contrast  Result Date: 02/14/2022 CLINICAL DATA:  History of lymphoma of the brain. Three month follow-up. EXAM: MRI HEAD WITHOUT AND WITH CONTRAST TECHNIQUE: Multiplanar, multiecho pulse sequences of the brain and surrounding structures were obtained without and with intravenous contrast. CONTRAST:  14m GADAVIST GADOBUTROL 1 MMOL/ML IV SOLN COMPARISON:  09/28/2021 and previous FINDINGS: Brain: There is recurrent disease since the prior exam. There is involvement of the left cerebellar peduncle with edema and abnormal enhancement. No involvement of this location has been seen previously. The right cerebral hemisphere remains negative. On the left, encephalomalacia of the posterior temporal lobe related to a previously treated lesion is unchanged without  recurrent enhancement in that location. There is new/recurrent disease affecting the left frontal operculum with edema and enhancement. This is patchy and multifocal. No other foci of involvement are demonstrated. Chronic scattered foci of hemosiderin deposition remain visible in the left cerebral hemisphere, unchanged. No hydrocephalus. No extra-axial collection. Vascular: Flow Skull and upper cervical spine: Negative Sinuses/Orbits: Clear/normal Other: None IMPRESSION: 1. New/recurrent disease affecting the left cerebellar peduncle and the left frontal operculum as outlined above. 2. Stable encephalomalacia of the posterior temporal lobe related to a previously treated lesion. No evidence of recurrent disease in that location. 3. 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 Electronically Signed   By: MNelson ChimesM.D.   On: 02/14/2022 08:55      IMPRESSION: Diffuse large cell non-Hodgkin's lymphoma-CNS involvement   We reviewed the patient's most recent MRI and CT scans of the brain.  We discussed potential options for management including whole brain radiation therapy or possibly SRS.  SRS may be difficult however given the difficulty in localizing the area for treatment.  She would like further evaluation concerning SRS treatment if technically possible.   PLAN: She will be scheduled to meet with Dr. SEppie Gibson  Her case will also be presented at the multidisciplinary brain tumor conference for further input.  Treatment details pending at this time.  I have recommended she continue on Decadron 4 mg daily and to use Zofran on a regular basis for her nausea.   60 minutes of total time was spent for this patient encounter, including preparation, face-to-face counseling with the patient and coordination of care, physical exam, and documentation of the encounter.   ------------------------------------------------  JBlair Promise PhD, MD  This document serves as a  record of services personally performed by JGery Pray MD. It was created on his behalf by ERoney Mans a trained medical scribe. The creation of this record is based on the scribe's personal observations and the provider's statements to them. This document has been checked and approved by the attending provider.

## 2022-03-06 ENCOUNTER — Other Ambulatory Visit: Payer: Self-pay

## 2022-03-06 ENCOUNTER — Ambulatory Visit
Admission: RE | Admit: 2022-03-06 | Discharge: 2022-03-06 | Disposition: A | Discharge status: Home / Self Care | Payer: Medicare Other | Source: Ambulatory Visit | Attending: Radiation Oncology | Admitting: Radiation Oncology

## 2022-03-06 ENCOUNTER — Encounter: Payer: Self-pay | Admitting: Radiation Oncology

## 2022-03-06 VITALS — BP 132/76 | HR 98 | Temp 98.2°F | Resp 17 | Wt 143.0 lb

## 2022-03-06 DIAGNOSIS — R609 Edema, unspecified: Secondary | ICD-10-CM | POA: Insufficient documentation

## 2022-03-06 DIAGNOSIS — C7931 Secondary malignant neoplasm of brain: Secondary | ICD-10-CM

## 2022-03-06 DIAGNOSIS — R590 Localized enlarged lymph nodes: Secondary | ICD-10-CM

## 2022-03-06 DIAGNOSIS — C8333 Diffuse large B-cell lymphoma, intra-abdominal lymph nodes: Secondary | ICD-10-CM | POA: Diagnosis not present

## 2022-03-06 DIAGNOSIS — C8510 Unspecified B-cell lymphoma, unspecified site: Secondary | ICD-10-CM | POA: Insufficient documentation

## 2022-03-06 DIAGNOSIS — Z79899 Other long term (current) drug therapy: Secondary | ICD-10-CM | POA: Diagnosis not present

## 2022-03-06 DIAGNOSIS — Z7952 Long term (current) use of systemic steroids: Secondary | ICD-10-CM | POA: Diagnosis not present

## 2022-03-06 DIAGNOSIS — I700 Atherosclerosis of aorta: Secondary | ICD-10-CM | POA: Insufficient documentation

## 2022-03-06 DIAGNOSIS — C8589 Other specified types of non-Hodgkin lymphoma, extranodal and solid organ sites: Secondary | ICD-10-CM | POA: Diagnosis not present

## 2022-03-06 DIAGNOSIS — R739 Hyperglycemia, unspecified: Secondary | ICD-10-CM | POA: Insufficient documentation

## 2022-03-06 MED ORDER — ONDANSETRON HCL 4 MG PO TABS
4.0000 mg | ORAL_TABLET | Freq: Once | ORAL | Status: AC
  Administered 2022-03-06: 4 mg via ORAL
  Filled 2022-03-06: qty 1

## 2022-03-08 NOTE — Progress Notes (Signed)
Location/Histology of Brain Tumor: Diffuse large cell non-Hodgkin's lymphoma- CNS involvement    Patient presented with symptoms of:   The patient initially presented to the ED on 12/08/2020 with acute onset of confusion, speech deficit and inability to follow commands. MRI of the brain performed at that time revealed 2 enhancing lesions in the left cerebral hemisphere. The largest of which measuring up to 1.9 cm x 1.9 cm x 2.2 cm located in the posterior temporal lobe. Surrounding edema was also appreciated, greater in the left temporal lobe, but without midline shift.  She was accordingly admitted and received IV Keppra and Decadron.  Her case was presented at the tumor board who recommended biopsy prior to stereotactic radiosurgery (as recommended by neurosurgery and rad-onc). However, radiosurgery was ultimately not pursed at that time.    In recent history, the patient presented for a routine follow-up MRI of the brain on 02/13/22 which unfortunately demonstrated evidence of new/recurrent disease affecting the left cerebellar peduncle and the left frontal operculum. MRI also showed stable post-treatment changes in the posterior temporal lobe (encephalomalacia) at the site of the previously treated lesion.    She soon after presented to the ED on 02/21/22 with nausea and vomiting x several days, headaches, and generalized weakness. CT of the head performed showed abnormal edema in the left middle cerebellar peduncle, left cerebellum, and left frontal operculum, consistent with recurrent disease as demonstrated on her most recent MRI. CT also showed a very slight hyperdensity in the left cerebellar region, which is sometimes seen with lymphoma involvement.    Past or anticipated interventions, if any, per neurosurgery: none at this time   Past or anticipated interventions, if any, per medical oncology:  mpression and Plan: [name] is a very charming 67 year old white female.  She initially came to  [place] with iron deficiency anemia.  We subsequently found that she had diffuse large cell non-Hodgkin's lymphoma.  It had traveled to her brain.   She has had a total of 8 cycles of treatment.  She had 4 cycles of a combination protocol.  We then gave her 4 cycles of MATRIX.   Treatment was completed in June.  I am just very surprised and disappointed that she has had recurrence.   The PET scan will certainly be very helpful.   Again, Radiation Oncology will be able to help her out.  I do not know if this radiation can be done with stereotactic radiosurgery.   When she completes this, then we will see about making referral to one of the academic centers for the likelihood of CAR-T therapy.   I am just happy that her quality life is good.  I want to try to improve this if we can.   I would like to see her back in 1 month.  At that point, we will see how she is feeling.  I would not think that we would do any type of follow-up MRI probably for a good 6 weeks after treatment.   She still has her Port-A-Cath and so we have to make sure that we flush this.    Volanda Napoleon, MD 1/3/20243:50 PM    Dose of Decadron, if applicable: '4mg'$    Recent neurologic symptoms, if any:  Seizures: no Headaches: intermittent headache, feels different Nausea: yes, vomiting last week with virus, nausea still remains Dizziness/ataxia: dizziness, uses walker at home, w/c as well Difficulty with hand coordination: none Focal numbness/weakness: weak overall Visual deficits/changes: right eye vision problems Confusion/Memory  deficits: none   Painful bone metastases at present, if any: none   SAFETY ISSUES: Prior radiation? none Pacemaker/ICD? none Possible current pregnancy? none Is the patient on methotrexate? none   Additional Complaints / other details: what type of treatment options there are??   Vitals:   03/10/22 0825  BP: 114/67  Pulse: 77  Resp: 18  Temp: 97.8 F (36.6 C)  SpO2: 100%

## 2022-03-08 NOTE — Progress Notes (Incomplete)
Location/Histology of Brain Tumor: Diffuse large cell non-Hodgkin's lymphoma- CNS involvement   Patient presented with symptoms of:  ***  Past or anticipated interventions, if any, per neurosurgery: {:18581}  Past or anticipated interventions, if any, per medical oncology:  mpression and Plan: [name] is a very charming 67 year old white female.  She initially came to [place] with iron deficiency anemia.  We subsequently found that she had diffuse large cell non-Hodgkin's lymphoma.  It had traveled to her brain.   She has had a total of 8 cycles of treatment.  She had 4 cycles of a combination protocol.  We then gave her 4 cycles of MATRIX.   Treatment was completed in June.  I am just very surprised and disappointed that she has had recurrence.   The PET scan will certainly be very helpful.   Again, Radiation Oncology will be able to help her out.  I do not know if this radiation can be done with stereotactic radiosurgery.   When she completes this, then we will see about making referral to one of the academic centers for the likelihood of CAR-T therapy.   I am just happy that her quality life is good.  I want to try to improve this if we can.   I would like to see her back in 1 month.  At that point, we will see how she is feeling.  I would not think that we would do any type of follow-up MRI probably for a good 6 weeks after treatment.   She still has her Port-A-Cath and so we have to make sure that we flush this.       Volanda Napoleon, MD 1/3/20243:50 PM   Dose of Decadron, if applicable: '4mg'$   Recent neurologic symptoms, if any:  Seizures: {:18581} Headaches: {:18581} Nausea: {:18581} Dizziness/ataxia: {:18581} Difficulty with hand coordination: {:18581} Focal numbness/weakness: {:18581} Visual deficits/changes: {:18581} Confusion/Memory deficits: {:18581}  Painful bone metastases at present, if any: {:18581}  SAFETY ISSUES: Prior radiation? {:18581} Pacemaker/ICD?  {:18581} Possible current pregnancy? {:18581} Is the patient on methotrexate? {:18581}  Additional Complaints / other details: ***

## 2022-03-09 ENCOUNTER — Other Ambulatory Visit: Payer: Self-pay | Admitting: Radiation Therapy

## 2022-03-09 NOTE — Progress Notes (Signed)
Radiation Oncology         [phone] ________________________________  Outpatient ReConsultation  Name: Ariel [name] MRN: [id number]  Date: 03/10/2022  DOB: 1955-03-23  CC:O'Sullivan, Lenna Sciara, NP  Gery Pray, MD   REFERRING PHYSICIAN: Gery Pray, MD  DIAGNOSIS:    ICD-10-CM   1. High grade B-cell lymphoma (HCC)  C85.10     2. 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 CNS lymphoma ([place])  C85.89       The primary encounter diagnosis was Abdominal lymphadenopathy. Diagnoses of High grade B-cell lymphoma ([place]) and Malignant neoplasm metastatic to brain [facility]) were also pertinent to this visit.   Diffuse large cell non-Hodgkin's lymphoma-CNS involvement - STAGE IV   History of stage I melanoma s/p resection in August 2021    Cancer Staging  Melanoma [facility]) Staging form: Melanoma of the Skin, AJCC 8th Edition - Clinical stage from 12/01/2020: Stage IB (cT1b, cN0, cM0) - Signed by Volanda Napoleon, MD on 12/01/2020   CHIEF COMPLAINT: Here to discuss management of brain metastases from diffuse large cell non-Hodgkin's lymphoma primary.   HISTORY OF PRESENT ILLNESS :: Ariel [name] is a 67 y.o. female who is accompanied by her cousin. She is seen as a courtesy of Dr. Marin Olp and Dr. Isidore Moos for an opinion concerning radiation therapy [facility]) as part of management for her recently diagnosed brain metastases from diffuse large cell non-Hodgkin's lymphoma primary. The patient met with Dr. Sondra Come in consultation on 03/06/22. Following discussion with Dr. Sondra Come, the patient requested further evaluation concerning SRS treatment. She was subsequently referred to me here today for a detailed discussion of whole brain radiation therapy vs SRS.   The patient initially presented to the ED on 12/08/2020 with acute onset of confusion, speech deficit and inability to follow commands. MRI of the brain performed at that time revealed 2 enhancing lesions in the  left cerebral hemisphere. The largest of which measuring up to 1.9 cm x 1.9 cm x 2.2 cm located in the posterior temporal lobe. Surrounding edema was also appreciated, greater in the left temporal lobe, but without midline shift.  She was accordingly admitted and received IV Keppra and Decadron.  Her case was presented at the tumor board who recommended biopsy prior to stereotactic radiosurgery (as recommended by neurosurgery and rad-onc). However, radiosurgery was ultimately not pursed at that time.    CT of the chest abdomen and pelvis also performed while inpatient showed prominent lymphadenopathy in the gastrohepatic ligament and celiac axis, and well as retroperitoneal lymphadenopathy.  A 17 mm right adrenal nodule was also appreciated.    GI was consulted and the patient underwent an EUS while inpatient on 12/14/2020.  FNA of a retroperitoneal lymph node showed findings consistent with high-grade B-cell lymphoma. A stomach biopsy was also collected and showed gastric mucosa with hyperemia and no evidence of h-pylori.    PET scan on 12/27/21 showed extensive adenopathy in the chest, abdomen, and pelvis.    Accordingly, the patient met with Dr. Marin Olp who recommended chemotherapy with R-ICE (the patient was already established with Dr. Marin Olp for management of iron deficiency anemia). She received her first cycle on 01/04/2021. Her treatment course was complicated due to psychological issues secondary to steroids. She was subsequently admitted on 01/14/22 and treated with a R-MTV regimen (per Dr. Marin Olp) to focus on her CNS lymphoma issue.  MRI's of the brain and CT AP performed while inpatient showed near complete resolution of  lymphoma.    Given her good response to both RICE and R-MTV  (with steroid cessation), the patient was started on an alternating regimen of RICE and R-MTV. She required admission for chemo infusions and completed her 4th and final cycle of RICE/R-MTV in February 2023.     She was then started on MATRIX protocol on 05/02/21 (also required admission for infusions). She tolerated hybrid treatment well other than pancytopenia, and completed her 4th and final cycle on 08/01/21.    Follow-up MRI of the brain on 09/28/21 showed no evidence of active intracranial disease. Restaging PET scan also performed on 08/02 showed no hypermetabolic adenopathy above or below the diaphragm suggestive of recurrent lymphoma.   In recent history, the patient presented for a routine follow-up MRI of the brain on 02/13/22 which unfortunately demonstrated evidence of new/recurrent disease affecting the left cerebellar peduncle and the left frontal operculum. MRI also showed stable post-treatment changes in the posterior temporal lobe (encephalomalacia) at the site of the previously treated lesion.    She soon after presented to the ED on 02/21/22 with nausea and vomiting x several days, headaches, and generalized weakness. CT of the head performed showed abnormal edema in the left middle cerebellar peduncle, left cerebellum, and left frontal operculum, consistent with recurrent disease as demonstrated on her most recent MRI. CT also showed a very slight hyperdensity in the left cerebellar region, which is sometimes seen with lymphoma involvement.    CT CAP also performed in the ED showed no definite findings to suggest recurrent lymphoma in the chest, abdomen, or pelvis.   Moving forward, Dr. Marin Olp will work on referring her to an academic center (likely [facility]) for consideration of CAR-T therapy. This will be preceded by radiation therapy which we will discuss in detail today.   However the patient does state that she is not interested in any systemic therapy at this time and would like to hold off on academic center consultations.  She is here with her supportive cousin today.  She is independent and lives alone at home.  PET scan for restaging her body is pending today.  She does use a walker  at home and this is a recent development.  She has been having more issues with equilibrium, balance, dizziness in the past few weeks.  She denies seizures or severe headaches.  She denies focal neurologic deficits.  She denies significant issues with memory or cognition  PREVIOUS RADIATION THERAPY: No  PAST MEDICAL HISTORY:  has a past medical history of Arthritis, Chicken pox, Complication of anesthesia, Goals of care, counseling/discussion (12/22/2020), High cholesterol, High grade B-cell lymphoma ([place]) (12/22/2020), Hyperglycemia (11/10/2020), Melanoma ([place]) (2021), Melanoma ([place]) (10/28/2020), PONV (postoperative nausea and vomiting), and Urinary incontinence.    PAST SURGICAL HISTORY: Past Surgical History:  Procedure Laterality Date   AIR/FLUID EXCHANGE Right 12/07/2020   Procedure: AIR/FLUID EXCHANGE;  Surgeon: Jalene Mullet, MD;  Location: [place];  Service: Ophthalmology;  Laterality: Right;   APPENDECTOMY  1962   BIOPSY  12/14/2020   Procedure: BIOPSY;  Surgeon: Rush Landmark Telford Nab., MD;  Location: [place];  Service: Gastroenterology;;   ESOPHAGOGASTRODUODENOSCOPY (EGD) WITH PROPOFOL N/A 12/14/2020   Procedure: ESOPHAGOGASTRODUODENOSCOPY (EGD) WITH PROPOFOL;  Surgeon: Irving Copas., MD;  Location: [place];  Service: Gastroenterology;  Laterality: N/A;   EXCISION MELANOMA WITH SENTINEL LYMPH NODE BIOPSY Right 10/09/2019   Procedure: WIDE LOCAL EXCISION WITH ADVANCEMENT FLAP CLOSURE RIGHT HIP MELANOMA WITH SENTINEL LYMPH NODE BIOPSY;  Surgeon: Stark Klein, MD;  Location: [place];  Service: General;  Laterality: Right;   FINE NEEDLE ASPIRATION  12/14/2020   Procedure: FINE NEEDLE ASPIRATION (FNA) LINEAR;  Surgeon: Irving Copas., MD;  Location: [place];  Service: Gastroenterology;;   HERNIA REPAIR  2009   IR IMAGING GUIDED PORT INSERTION  12/30/2020   MOUTH SURGERY  11/2015   gum surgery and bone implant   PARS PLANA VITRECTOMY Right 12/07/2020    Procedure: PARS PLANA VITRECTOMY WITH 25 GAUGE;  Surgeon: Jalene Mullet, MD;  Location: [place];  Service: Ophthalmology;  Laterality: Right;   PHOTOCOAGULATION WITH LASER Right 12/07/2020   Procedure: PHOTOCOAGULATION WITH ENDOLASER PANRITINAL COAGULATION;  Surgeon: Jalene Mullet, MD;  Location: [place];  Service: Ophthalmology;  Laterality: Right;   TOTAL HIP ARTHROPLASTY Left 10/11/2021   Procedure: LEFT TOTAL HIP ARTHROPLASTY ANTERIOR APPROACH;  Surgeon: Melrose Nakayama, MD;  Location: [facility];  Service: Orthopedics;  Laterality: Left;   TOTAL HIP ARTHROPLASTY Right 01/10/2022   Procedure: RIGHT TOTAL HIP ARTHROPLASTY ANTERIOR APPROACH;  Surgeon: Melrose Nakayama, MD;  Location: [facility];  Service: Orthopedics;  Laterality: Right;   UPPER ESOPHAGEAL ENDOSCOPIC ULTRASOUND (EUS) N/A 12/14/2020   Procedure: UPPER ESOPHAGEAL ENDOSCOPIC ULTRASOUND (EUS);  Surgeon: Irving Copas., MD;  Location: [place];  Service: Gastroenterology;  Laterality: N/A;   WISDOM TOOTH EXTRACTION      FAMILY HISTORY: family history includes Alcohol abuse in her father; COPD in her father; Cancer in her sister; Coronary artery disease in her father and sister; Diabetes Mellitus II in her father; Heart attack in her brother; Hyperlipidemia in some other family members; Hypertension in an other family member.  SOCIAL HISTORY:  reports that she has never smoked. She has never used smokeless tobacco. She reports that she does not drink alcohol and does not use drugs.  ALLERGIES: Decadron [dexamethasone], Plavix [clopidogrel], Prednisone, and Codeine  MEDICATIONS:  Current Outpatient Medications  Medication Sig Dispense Refill   acetaminophen (TYLENOL) 500 MG tablet Take 500-1,000 mg by mouth every 6 (six) hours as needed (for pain.).     ondansetron (ZOFRAN) 8 MG tablet Take 1 tablet (8 mg total) by mouth every 8 (eight) hours as needed for nausea or vomiting. 20 tablet 1   aspirin EC 81 MG tablet Take 1 tablet  (81 mg total) by mouth 2 (two) times daily after a meal. For 2 weeks then once a day for 2 weeks for dvt prevention (Patient not taking: Reported on 03/10/2022) 45 tablet 0   dexamethasone (DECADRON) 4 MG tablet Take 1 tablet (4 mg total) by mouth daily. Please make sure you take the dexamethasone with food. (Patient not taking: Reported on 03/06/2022) 30 tablet 2   Misc Natural Products (PRO NUTRIENTS FRUIT & VEGGIE PO) Take 4 capsules by mouth in the morning. Balance of Nature Fruit and Vegetable Supplements (2) Fruit + (2) Vegetable (Patient not taking: Reported on 03/06/2022)     Multiple Vitamins-Minerals (MULTIVITAMIN WITH MINERALS) tablet Take 1 tablet by mouth daily. (Patient not taking: Reported on 03/10/2022)     pantoprazole (PROTONIX) 40 MG tablet Take 1 tablet (40 mg total) by mouth daily. (Patient not taking: Reported on 03/06/2022) 30 tablet 3   tiZANidine (ZANAFLEX) 4 MG tablet Take 1 tablet (4 mg total) by mouth every 6 (six) hours as needed for muscle spasms. (Patient not taking: Reported on 03/06/2022) 40 tablet 0   traMADol (ULTRAM) 50 MG tablet Take 1 tablet (50 mg total) by mouth every 6 (six) hours as needed for  moderate pain or severe pain (post op pain). (Patient not taking: Reported on 03/06/2022) 20 tablet 0   No current facility-administered medications for this encounter.   Facility-Administered Medications Ordered in Other Encounters  Medication Dose Route Frequency Provider Last Rate Last Admin   sodium chloride flush (NS) 0.9 % injection 10 mL  10 mL Intravenous PRN Volanda Napoleon, MD   10 mL at 10/04/21 0946    REVIEW OF SYSTEMS:  Notable for that above.   PHYSICAL EXAM:  height is '5\' 7"'$  (1.702 m) and weight is 145 lb 6.4 oz (66 kg). Her oral temperature is 97.8 F (36.6 C). Her blood pressure is 114/67 and her pulse is 77. Her respiration is 18 and oxygen saturation is 100%.   General: Alert and oriented, in no acute distress   HEENT: Head is normocephalic. Extraocular  movements are intact. Oropharynx is clear. Neck: Neck is supple, no palpable cervical or supraclavicular lymphadenopathy. Heart: Regular in rate and rhythm with no murmurs, rubs, or gallops. Chest: Clear to auscultation bilaterally, with no rhonchi, wheezes, or rales. Abdomen: Soft, nontender, nondistended, with no rigidity or guarding. Extremities: No cyanosis or edema. Lymphatics: see Neck Exam Skin: No concerning lesions. Musculoskeletal: symmetric strength and muscle tone throughout. Neurologic: Cranial nerves II through XII are grossly intact. No obvious focalities. Speech is fluent. Coordination is intact.  Gait was not tested as she did not have her walker today Psychiatric: Judgment and insight are intact. Affect is appropriate.  KPS = 70  100 - Normal; no complaints; no evidence of disease. 90   - Able to carry on normal activity; minor signs or symptoms of disease. 80   - Normal activity with effort; some signs or symptoms of disease. 39   - Cares for self; unable to carry on normal activity or to do active work. 60   - Requires occasional assistance, but is able to care for most of his personal needs. 50   - Requires considerable assistance and frequent medical care. 12   - Disabled; requires special care and assistance. 45   - Severely disabled; hospital admission is indicated although death not imminent. 36   - Very sick; hospital admission necessary; active supportive treatment necessary. 10   - Moribund; fatal processes progressing rapidly. 0     - Dead  Karnofsky DA, [place], [place] and [place] [phone]) The use of the nitrogen mustards in the palliative treatment of carcinoma: with particular reference to bronchogenic carcinoma Cancer 1 634-56     LABORATORY DATA:  Lab Results  Component Value Date   WBC 4.2 03/01/2022   HGB 12.4 03/01/2022   HCT 36.0 03/01/2022   MCV 95.7 03/01/2022   PLT 166 03/01/2022   CMP     Component Value Date/Time   NA 137  03/01/2022 1517   K 4.0 03/01/2022 1517   CL 98 03/01/2022 1517   CO2 30 03/01/2022 1517   GLUCOSE 117 (H) 03/01/2022 1517   BUN 12 03/01/2022 1517   CREATININE 0.62 03/01/2022 1517   CREATININE 0.68 11/18/2012 1439   CALCIUM 10.1 03/01/2022 1517   PROT 7.2 03/01/2022 1517   ALBUMIN 4.7 03/01/2022 1517   AST 17 03/01/2022 1517   ALT 22 03/01/2022 1517   ALKPHOS 61 03/01/2022 1517   BILITOT 0.4 03/01/2022 1517   GFRNONAA >60 03/01/2022 1517   GFRNONAA >89 11/18/2012 1439   GFRAA >60 09/30/2019 1050   GFRAA >89 11/18/2012 1439  RADIOGRAPHY: CT CHEST ABDOMEN PELVIS W CONTRAST  Result Date: 02/21/2022 CLINICAL DATA:  67 year old female with history of generalized weakness. History of lymphoma. * Tracking Code: BO * EXAM: CT CHEST, ABDOMEN, AND PELVIS WITH CONTRAST TECHNIQUE: Multidetector CT imaging of the chest, abdomen and pelvis was performed following the standard protocol during bolus administration of intravenous contrast. RADIATION DOSE REDUCTION: This exam was performed according to the departmental dose-optimization program which includes automated exposure control, adjustment of the mA and/or kV according to patient size and/or use of iterative reconstruction technique. CONTRAST:  165m OMNIPAQUE IOHEXOL 300 MG/ML  SOLN COMPARISON:  CT of the chest, abdomen and pelvis 02/08/2021. PET-CT 09/28/2021. FINDINGS: CT CHEST FINDINGS Cardiovascular: Heart size is normal. There is no significant pericardial fluid, thickening or pericardial calcification. Atherosclerotic calcifications in the thoracic aorta. No definite coronary artery calcifications. Right internal jugular single-lumen Port-A-Cath with tip terminating in the right atrium. Mediastinum/Nodes: No pathologically enlarged mediastinal or hilar lymph nodes. Esophagus is unremarkable in appearance. No axillary lymphadenopathy. Lungs/Pleura: No acute consolidative airspace disease. No pleural effusions. No suspicious appearing  pulmonary nodules or masses are noted. Musculoskeletal: There are no aggressive appearing lytic or blastic lesions noted in the visualized portions of the skeleton. CT ABDOMEN PELVIS FINDINGS Hepatobiliary: No suspicious cystic or solid hepatic lesions. No intra or extrahepatic biliary ductal dilatation. Gallbladder is normal in appearance. Pancreas: No pancreatic mass. No pancreatic ductal dilatation. No pancreatic or peripancreatic fluid collections or inflammatory changes. Spleen: Unremarkable. Adrenals/Urinary Tract: Right adrenal nodule measuring 2.3 x 1.5 cm, slightly larger than prior examinations (previously 1.7 x 1.3 cm on prior CT examination 12/09/2020), but previously not hypermetabolic on PET-CT 026/83/4196 Bilateral kidneys and left adrenal gland are normal in appearance. No hydroureteronephrosis. Urinary bladder is normal in appearance. Stomach/Bowel: The appearance of the stomach is normal. There is no pathologic dilatation of small bowel or colon. The appendix is not confidently identified and may be surgically absent. Regardless, there are no inflammatory changes noted adjacent to the cecum to suggest the presence of an acute appendicitis at this time. Vascular/Lymphatic: Aortic atherosclerosis. No lymphadenopathy noted in the abdomen or pelvis. Reproductive: Uterus and ovaries are unremarkable in appearance. Other: No significant volume of ascites.  No pneumoperitoneum. Musculoskeletal: Status post bilateral hip arthroplasty. There are no aggressive appearing lytic or blastic lesions noted in the visualized portions of the skeleton. IMPRESSION: 1. No definite findings to suggest recurrent lymphoma in the chest, abdomen or pelvis. 2. The patient has had a right adrenal nodule for some time. This has shown some slight interval growth, but was previously not hypermetabolic on prior PET-CT. This is nonspecific, but favored to be benign. Close attention on follow-up studies is recommended. 3. Aortic  atherosclerosis. 4. Additional incidental findings, as above. Electronically Signed   By: DVinnie LangtonM.D.   On: 02/21/2022 09:54   CT Head Wo Contrast  Result Date: 02/21/2022 CLINICAL DATA:  Brain metastases suspected.  History of lymphoma. EXAM: CT HEAD WITHOUT CONTRAST TECHNIQUE: Contiguous axial images were obtained from the base of the skull through the vertex without intravenous contrast. RADIATION DOSE REDUCTION: This exam was performed according to the departmental dose-optimization program which includes automated exposure control, adjustment of the mA and/or kV according to patient size and/or use of iterative reconstruction technique. COMPARISON:  MRI 02/13/2022 FINDINGS: Brain: Abnormal edema evident within the left middle cerebellar peduncle and left cerebellum consistent with recurrent disease shown by MRI. Small region of edema in the left frontal operculum also consistent with  recurrent disease as shown by prior MRI. Chronic encephalomalacia of the posterior left temporal lobe as seen previously. No change when compared to the recent MRI. No hydrocephalus, hemorrhage or extra-axial collection. Very slight hyperdensity in the left cerebellar region can be seen with lymphoma involvement. Vascular: There is atherosclerotic calcification of the major vessels at the base of the brain. Skull: Negative Sinuses/Orbits: Clear/normal Other: None IMPRESSION: 1. Abnormal edema in the left middle cerebellar peduncle, left cerebellum and left frontal operculum consistent with recurrent disease as shown by MRI. No change when compared to the recent MRI. 2. Chronic encephalomalacia of the posterior left temporal lobe as seen previously. 3. Very slight hyperdensity in the left cerebellar region can be seen with lymphoma involvement. Electronically Signed   By: Nelson Chimes M.D.   On: 02/21/2022 09:41   MR Brain W Wo Contrast  Result Date: 02/14/2022 CLINICAL DATA:  History of lymphoma of the brain.  Three month follow-up. EXAM: MRI HEAD WITHOUT AND WITH CONTRAST TECHNIQUE: Multiplanar, multiecho pulse sequences of the brain and surrounding structures were obtained without and with intravenous contrast. CONTRAST:  43m GADAVIST GADOBUTROL 1 MMOL/ML IV SOLN COMPARISON:  09/28/2021 and previous FINDINGS: Brain: There is recurrent disease since the prior exam. There is involvement of the left cerebellar peduncle with edema and abnormal enhancement. No involvement of this location has been seen previously. The right cerebral hemisphere remains negative. On the left, encephalomalacia of the posterior temporal lobe related to a previously treated lesion is unchanged without recurrent enhancement in that location. There is new/recurrent disease affecting the left frontal operculum with edema and enhancement. This is patchy and multifocal. No other foci of involvement are demonstrated. Chronic scattered foci of hemosiderin deposition remain visible in the left cerebral hemisphere, unchanged. No hydrocephalus. No extra-axial collection. Vascular: Flow Skull and upper cervical spine: Negative Sinuses/Orbits: Clear/normal Other: None IMPRESSION: 1. New/recurrent disease affecting the left cerebellar peduncle and the left frontal operculum as outlined above. 2. Stable encephalomalacia of the posterior temporal lobe related to a previously treated lesion. No evidence of recurrent disease in that location. 3. 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 Electronically Signed   By: MNelson ChimesM.D.   On: 02/14/2022 08:55      IMPRESSION/PLAN: Stage IV diffuse large B-cell lymphoma with CNS relapse after aggressive chemotherapy  I went through the patient's MRIs with her and her cousin to review the evolution of her disease.  She initially had an excellent response to chemotherapy but now has 2 new areas of brain disease in the left frontal operculum in the left cerebellum.  She has some  equilibrium and gait issues that have recently developed which I presume are from the cerebellar disease. These MRIs will be reviewed at tumor board, as well, next week.   PET scan for restaging will take place later today.  She would like those systemic results to help her make a decision regarding her brain treatment.  She is nevertheless interested in radiation therapy to salvage her brain disease.  She is not interested in further chemotherapy at a university at this time  Today, I talked to the patient about the findings and work-up thus far.  We discussed the patient's diagnosis of CNS lymphoma and general treatment for this, highlighting the role of radiotherapy in the management.  We discussed the available radiation techniques, and focused on the details of logistics and delivery.     We first discussed whole brain radiation therapy.  We  discussed that sometimes this is given for over a month and that the traditional regimens carry a significant risk of late effects, cognitive decline.  If she were to undergo whole brain radiation I would recommend no more than 3 and half weeks of treatment in which relatively low but prophylactic dose is given to the whole brain and a higher dose is given to boost to the gross disease.  I believe this would be comprehensive and beneficial to help her brain return to and maintain in remission but it would still have a risk of long-term neurologic side effects as well as several weeks of fatigue and possibly full scalp alopecia.  A less standard approach to address CNS lymphoma is stereotactic radiosurgery.  This would most likely be beneficial to control the disease in the areas were we target her tumors but she would be at a very high risk for new lesions developing elsewhere in the brain.  There is some risk of radionecrosis to the brain.  Stereotactic radiosurgery is much less likely to cause long-term cognitive side effects, significant fatigue, alopecia.      She has a good understanding of the pros and cons of both treatment options.  Consent forms were signed for both treatment options.  She will be discussed at CNS tumor board next week.  I will regroup with her by phone after her PET scan results are available in the CNS tumor board his weighed in on treatment recommendations.  She is pleased with this plan. She also requests referral to neurology which I will arrange.     On date of service, in total, I spent 65 minutes on this encounter. Patient was seen in person.   __________________________________________   Eppie Gibson, MD  This document serves as a record of services personally performed by Eppie Gibson, MD. It was created on her behalf by Roney Mans, a trained medical scribe. The creation of this record is based on the scribe's personal observations and the provider's statements to them. This document has been checked and approved by the attending provider.

## 2022-03-10 ENCOUNTER — Ambulatory Visit
Admission: RE | Admit: 2022-03-10 | Discharge: 2022-03-10 | Disposition: A | Discharge status: Home / Self Care | Payer: Medicare Other | Source: Ambulatory Visit | Attending: Radiation Oncology | Admitting: Radiation Oncology

## 2022-03-10 ENCOUNTER — Encounter: Payer: Self-pay | Admitting: Radiation Oncology

## 2022-03-10 ENCOUNTER — Ambulatory Visit (HOSPITAL_COMMUNITY)
Admission: RE | Admit: 2022-03-10 | Discharge: 2022-03-10 | Disposition: A | Discharge status: Home / Self Care | Payer: Medicare Other | Source: Ambulatory Visit | Attending: Hematology & Oncology | Admitting: Hematology & Oncology

## 2022-03-10 ENCOUNTER — Other Ambulatory Visit: Payer: Self-pay | Admitting: Radiation Therapy

## 2022-03-10 VITALS — BP 114/67 | HR 77 | Temp 97.8°F | Resp 18 | Ht 67.0 in | Wt 145.4 lb

## 2022-03-10 DIAGNOSIS — C7931 Secondary malignant neoplasm of brain: Secondary | ICD-10-CM | POA: Diagnosis not present

## 2022-03-10 DIAGNOSIS — Z7952 Long term (current) use of systemic steroids: Secondary | ICD-10-CM | POA: Insufficient documentation

## 2022-03-10 DIAGNOSIS — I700 Atherosclerosis of aorta: Secondary | ICD-10-CM | POA: Insufficient documentation

## 2022-03-10 DIAGNOSIS — C8590 Non-Hodgkin lymphoma, unspecified, unspecified site: Secondary | ICD-10-CM

## 2022-03-10 DIAGNOSIS — C8589 Other specified types of non-Hodgkin lymphoma, extranodal and solid organ sites: Secondary | ICD-10-CM | POA: Insufficient documentation

## 2022-03-10 DIAGNOSIS — C8510 Unspecified B-cell lymphoma, unspecified site: Secondary | ICD-10-CM | POA: Insufficient documentation

## 2022-03-10 DIAGNOSIS — Z79899 Other long term (current) drug therapy: Secondary | ICD-10-CM | POA: Insufficient documentation

## 2022-03-10 DIAGNOSIS — D3501 Benign neoplasm of right adrenal gland: Secondary | ICD-10-CM | POA: Diagnosis not present

## 2022-03-10 DIAGNOSIS — D509 Iron deficiency anemia, unspecified: Secondary | ICD-10-CM | POA: Insufficient documentation

## 2022-03-10 DIAGNOSIS — G9389 Other specified disorders of brain: Secondary | ICD-10-CM | POA: Insufficient documentation

## 2022-03-10 DIAGNOSIS — R918 Other nonspecific abnormal finding of lung field: Secondary | ICD-10-CM | POA: Diagnosis not present

## 2022-03-10 DIAGNOSIS — C8332 Diffuse large B-cell lymphoma, intrathoracic lymph nodes: Secondary | ICD-10-CM

## 2022-03-10 LAB — GLUCOSE, CAPILLARY: Glucose-Capillary: 120 mg/dL — ABNORMAL HIGH (ref 70–99)

## 2022-03-10 MED ORDER — FLUDEOXYGLUCOSE F - 18 (FDG) INJECTION
7.2300 | Freq: Once | INTRAVENOUS | Status: AC
  Administered 2022-03-10: 7.23 via INTRAVENOUS

## 2022-03-13 ENCOUNTER — Encounter: Payer: Self-pay | Admitting: *Deleted

## 2022-03-13 ENCOUNTER — Inpatient Hospital Stay: Payer: Medicare Other

## 2022-03-13 NOTE — Progress Notes (Signed)
PET scan shows no evidence of lymphoma. She is scheduled to see Dr Mickeal Skinner for consideration of treatment for brain mets.   Oncology Nurse Navigator Documentation     03/13/2022    8:00 AM  Oncology Nurse Navigator Flowsheets  Navigator Follow Up Date: 03/30/2022  Navigator Follow Up Reason: Follow-up Appointment  Navigator Location [facility]  Navigator Encounter Type Scan Review  Patient Visit Type MedOnc  Treatment Phase Pre-Tx/Tx Discussion  Barriers/Navigation Needs No Barriers At This Time  Interventions None Required  Acuity Level 1-No Barriers  Support Groups/Services Friends and Family  Time Spent with Patient 15

## 2022-03-14 ENCOUNTER — Other Ambulatory Visit: Payer: Self-pay | Admitting: Hematology & Oncology

## 2022-03-15 ENCOUNTER — Other Ambulatory Visit: Payer: Self-pay

## 2022-03-15 DIAGNOSIS — C8510 Unspecified B-cell lymphoma, unspecified site: Secondary | ICD-10-CM

## 2022-03-16 ENCOUNTER — Other Ambulatory Visit: Payer: Self-pay

## 2022-03-16 ENCOUNTER — Other Ambulatory Visit: Payer: Self-pay | Admitting: *Deleted

## 2022-03-16 ENCOUNTER — Inpatient Hospital Stay: Payer: Medicare Other | Admitting: Internal Medicine

## 2022-03-16 VITALS — BP 115/60 | HR 80 | Temp 97.5°F | Resp 18 | Wt 141.6 lb

## 2022-03-16 DIAGNOSIS — D702 Other drug-induced agranulocytosis: Secondary | ICD-10-CM | POA: Diagnosis not present

## 2022-03-16 DIAGNOSIS — C8330 Diffuse large B-cell lymphoma, unspecified site: Secondary | ICD-10-CM | POA: Diagnosis not present

## 2022-03-16 DIAGNOSIS — E876 Hypokalemia: Secondary | ICD-10-CM | POA: Diagnosis not present

## 2022-03-16 DIAGNOSIS — N390 Urinary tract infection, site not specified: Secondary | ICD-10-CM | POA: Diagnosis not present

## 2022-03-16 DIAGNOSIS — U071 COVID-19: Secondary | ICD-10-CM | POA: Diagnosis not present

## 2022-03-16 DIAGNOSIS — E222 Syndrome of inappropriate secretion of antidiuretic hormone: Secondary | ICD-10-CM | POA: Diagnosis not present

## 2022-03-16 DIAGNOSIS — J1282 Pneumonia due to coronavirus disease 2019: Secondary | ICD-10-CM | POA: Diagnosis not present

## 2022-03-16 DIAGNOSIS — Z515 Encounter for palliative care: Secondary | ICD-10-CM | POA: Diagnosis not present

## 2022-03-16 DIAGNOSIS — D75838 Other thrombocytosis: Secondary | ICD-10-CM | POA: Diagnosis not present

## 2022-03-16 DIAGNOSIS — B370 Candidal stomatitis: Secondary | ICD-10-CM | POA: Diagnosis not present

## 2022-03-16 DIAGNOSIS — C8339 Diffuse large B-cell lymphoma, extranodal and solid organ sites: Secondary | ICD-10-CM | POA: Diagnosis not present

## 2022-03-16 DIAGNOSIS — Z8249 Family history of ischemic heart disease and other diseases of the circulatory system: Secondary | ICD-10-CM | POA: Diagnosis not present

## 2022-03-16 DIAGNOSIS — T451X5A Adverse effect of antineoplastic and immunosuppressive drugs, initial encounter: Secondary | ICD-10-CM | POA: Diagnosis not present

## 2022-03-16 DIAGNOSIS — C4371 Malignant melanoma of right lower limb, including hip: Secondary | ICD-10-CM | POA: Diagnosis not present

## 2022-03-16 DIAGNOSIS — E7800 Pure hypercholesterolemia, unspecified: Secondary | ICD-10-CM | POA: Diagnosis not present

## 2022-03-16 DIAGNOSIS — R000 Tachycardia, unspecified: Secondary | ICD-10-CM | POA: Diagnosis not present

## 2022-03-16 DIAGNOSIS — C7931 Secondary malignant neoplasm of brain: Secondary | ICD-10-CM | POA: Diagnosis not present

## 2022-03-16 DIAGNOSIS — D6959 Other secondary thrombocytopenia: Secondary | ICD-10-CM | POA: Diagnosis not present

## 2022-03-16 DIAGNOSIS — Z8616 Personal history of COVID-19: Secondary | ICD-10-CM | POA: Diagnosis not present

## 2022-03-16 DIAGNOSIS — C8589 Other specified types of non-Hodgkin lymphoma, extranodal and solid organ sites: Secondary | ICD-10-CM

## 2022-03-16 DIAGNOSIS — D61818 Other pancytopenia: Secondary | ICD-10-CM | POA: Diagnosis not present

## 2022-03-16 DIAGNOSIS — E440 Moderate protein-calorie malnutrition: Secondary | ICD-10-CM | POA: Diagnosis not present

## 2022-03-16 DIAGNOSIS — J9811 Atelectasis: Secondary | ICD-10-CM | POA: Diagnosis not present

## 2022-03-16 DIAGNOSIS — Z6821 Body mass index (BMI) 21.0-21.9, adult: Secondary | ICD-10-CM | POA: Diagnosis not present

## 2022-03-16 DIAGNOSIS — R4701 Aphasia: Secondary | ICD-10-CM | POA: Diagnosis not present

## 2022-03-16 DIAGNOSIS — R29810 Facial weakness: Secondary | ICD-10-CM | POA: Diagnosis not present

## 2022-03-16 MED ORDER — DEXAMETHASONE 4 MG PO TABS: 4.0000 mg | ORAL_TABLET | Freq: Every day | ORAL | 2 refills | Status: DC

## 2022-03-16 NOTE — Progress Notes (Signed)
[facility] [place], [place] [address] [phone]   New Patient Evaluation  Date of Service: 03/16/22 Patient Name: Ariel [name] Patient MRN: [id number] Patient DOB: 10-02-1955 Provider: Ventura Sellers, MD  Identifying Statement:  Ariel [name] is a 67 y.o. 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[place])   01/04/2021 - 01/10/2021 Chemotherapy   Patient is on Treatment Plan : NON-HODGKINS LYMPHOMA R-ICE q21d     01/25/2021 - 02/02/2021 Chemotherapy   Patient is on Treatment Plan : IP NON-HODGKINS LYMPHOMA R-MTV q21d     03/03/2021 - 03/03/2021 Chemotherapy   Patient is on Treatment Plan : IP NON-HODGKINS LYMPHOMA RICE q21d     03/08/2021 - 03/14/2021 Chemotherapy   Patient is on Treatment Plan : IP NON-HODGKINS LYMPHOMA RICE q21d     04/04/2021 - 08/05/2021 Chemotherapy   Patient is on Treatment Plan : IP R-MTV x 1 then IP MATRix q21d starting 05/02/21     Diffuse large B cell lymphoma ([place])  03/07/2021 Initial Diagnosis   Diffuse large B cell lymphoma ([place])   04/04/2021 - 08/05/2021 Chemotherapy   Patient is on Treatment Plan : IP R-MTV x 1 then IP MATRix q21d starting 05/02/21       History of Present Illness: The patient's records from the referring physician were obtained and reviewed and the patient interviewed to confirm this HPI.  Ariel [name] presents for  evaluation today of CNS lymphoma.  She describes decline in function in recent weeks; severe nausea and vomiting depite dosing zofran, numbness along left side of body.  She is dizzy and impaired somewhat with gait.  Most limiting is severe fatigue, sleeping up to 19 hours per day currently.  She is tired during brief awake periods as well.  Currently living alone, son is close by.  Recently had PET study as well through Dr. Marin Olp.  Medications: Current Outpatient Medications on File Prior to Visit  Medication Sig Dispense Refill   acetaminophen (TYLENOL) 500 MG tablet Take 500-1,000 mg by mouth every 6 (six) hours as needed (for pain.).     aspirin EC 81 MG tablet Take 1 tablet (81 mg total) by mouth 2 (two) times daily after a meal. For 2 weeks then once a day for 2 weeks for dvt prevention (Patient not taking: Reported on 03/10/2022) 45 tablet 0   dexamethasone (DECADRON) 4 MG tablet Take 1 tablet (4 mg total) by mouth daily. Please make sure you take the dexamethasone with food. (Patient not taking: Reported on 03/06/2022) 30 tablet 2   Misc Natural Products (PRO NUTRIENTS FRUIT & VEGGIE PO) Take 4 capsules by mouth in the morning. Balance of Nature Fruit and Vegetable Supplements (2) Fruit + (2) Vegetable (Patient not taking: Reported on 03/06/2022)     Multiple Vitamins-Minerals (MULTIVITAMIN WITH MINERALS) tablet Take 1 tablet by mouth daily. (Patient not taking: Reported on 03/10/2022)  ondansetron (ZOFRAN) 8 MG tablet TAKE 1 TABLET BY MOUTH EVERY 8 HOURS AS NEEDED FOR NAUSEA OR VOMITING 20 tablet 1   pantoprazole (PROTONIX) 40 MG tablet Take 1 tablet (40 mg total) by mouth daily. (Patient not taking: Reported on 03/06/2022) 30 tablet 3   tiZANidine (ZANAFLEX) 4 MG tablet Take 1 tablet (4 mg total) by mouth every 6 (six) hours as needed for muscle spasms. (Patient not taking: Reported on 03/06/2022) 40 tablet 0   traMADol (ULTRAM) 50 MG tablet Take 1 tablet (50 mg total) by mouth every 6 (six) hours  as needed for moderate pain or severe pain (post op pain). (Patient not taking: Reported on 03/06/2022) 20 tablet 0   Current Facility-Administered Medications on File Prior to Visit  Medication Dose Route Frequency Provider Last Rate Last Admin   sodium chloride flush (NS) 0.9 % injection 10 mL  10 mL Intravenous PRN Volanda Napoleon, MD   10 mL at 10/04/21 0946    Allergies:  Allergies  Allergen Reactions   Decadron [Dexamethasone] Other (See Comments)    Made the patient's legs tired and she could not walk. 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[place]) 12/22/2020   Hyperglycemia 11/10/2020   Melanoma ([place]) 2021   right hip   Melanoma ([place]) 10/28/2020   pt stated brain liver and bladder   PONV (postoperative nausea and vomiting)    Urinary incontinence    Past Surgical History:  Past Surgical History:  Procedure Laterality Date   AIR/FLUID EXCHANGE Right 12/07/2020   Procedure: AIR/FLUID EXCHANGE;  Surgeon: Jalene Mullet, MD;  Location: [facility];  Service: Ophthalmology;  Laterality: Right;   APPENDECTOMY  1962   BIOPSY  12/14/2020   Procedure: BIOPSY;  Surgeon: Rush Landmark Telford Nab., MD;  Location: [facility];  Service: Gastroenterology;;   ESOPHAGOGASTRODUODENOSCOPY (EGD) WITH PROPOFOL N/A 12/14/2020   Procedure: ESOPHAGOGASTRODUODENOSCOPY (EGD) WITH PROPOFOL;  Surgeon: Irving Copas., MD;  Location: [facility];  Service: Gastroenterology;  Laterality: N/A;   EXCISION MELANOMA WITH SENTINEL LYMPH NODE  BIOPSY Right 10/09/2019   Procedure: WIDE LOCAL EXCISION WITH ADVANCEMENT FLAP CLOSURE RIGHT HIP MELANOMA WITH SENTINEL LYMPH NODE BIOPSY;  Surgeon: Stark Klein, MD;  Location: [facility];  Service: General;  Laterality: Right;   FINE NEEDLE ASPIRATION  12/14/2020   Procedure: FINE NEEDLE ASPIRATION (FNA) LINEAR;  Surgeon: Irving Copas., MD;  Location: [facility];  Service: Gastroenterology;;   HERNIA REPAIR  2009   IR IMAGING GUIDED PORT INSERTION  12/30/2020   MOUTH SURGERY  11/2015   gum surgery and bone implant   PARS PLANA VITRECTOMY Right 12/07/2020   Procedure: PARS PLANA VITRECTOMY WITH 25 GAUGE;  Surgeon: Jalene Mullet, MD;  Location: [facility];  Service: Ophthalmology;  Laterality: Right;   PHOTOCOAGULATION WITH LASER Right 12/07/2020   Procedure: PHOTOCOAGULATION WITH ENDOLASER PANRITINAL COAGULATION;  Surgeon: Jalene Mullet, MD;  Location: [facility];  Service: Ophthalmology;  Laterality: Right;   TOTAL HIP ARTHROPLASTY Left 10/11/2021  Procedure: LEFT TOTAL HIP ARTHROPLASTY ANTERIOR APPROACH;  Surgeon: Melrose Nakayama, MD;  Location: [facility];  Service: Orthopedics;  Laterality: Left;   TOTAL HIP ARTHROPLASTY Right 01/10/2022   Procedure: RIGHT TOTAL HIP ARTHROPLASTY ANTERIOR APPROACH;  Surgeon: Melrose Nakayama, MD;  Location: [facility];  Service: Orthopedics;  Laterality: Right;   UPPER ESOPHAGEAL ENDOSCOPIC ULTRASOUND (EUS) N/A 12/14/2020   Procedure: UPPER ESOPHAGEAL ENDOSCOPIC ULTRASOUND (EUS);  Surgeon: Irving Copas., MD;  Location: [facility];  Service: Gastroenterology;  Laterality: N/A;   WISDOM TOOTH EXTRACTION     Social History:  Social History   Socioeconomic History   Marital status: Widowed    Spouse name: Not on file   Number of children: Not on file   Years of education: Not on file   Highest education level: Not on file  Occupational History   Occupation: retired  Tobacco Use   Smoking status: Never   Smokeless tobacco: Never  Vaping Use    Vaping Use: Never used  Substance and Sexual Activity   Alcohol use: Never   Drug use: Never   Sexual activity: Not Currently  Other Topics Concern   Not on file  Social History Narrative   05/09/2021   Presently living at [facility] in [place].   One Son- lives locally, 1 grand-daughterDaughter- Ariel. 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   Right handed   Home one level    Caffeine 1-cup   Social Determinants of Health   Financial Resource Strain: Low Risk  (07/13/2021)   Overall Financial Resource Strain (CARDIA)    Difficulty of Paying Living Expenses: Not hard at all  Food Insecurity: No Food Insecurity (07/13/2021)   Hunger Vital Sign    Worried About Running Out of Food in the Last Year: Never true    Ran Out of Food in the Last Year: Never true  Transportation Needs: No Transportation Needs (07/13/2021)   PRAPARE - Hydrologist (Medical): No    Lack of Transportation (Non-Medical): No  Physical Activity: Insufficiently Active (07/13/2021)   Exercise Vital Sign    Days of Exercise per Week: 3 days    Minutes of Exercise per Session: 20 min  Stress: No Stress Concern Present (07/13/2021)   [place]    Feeling of Stress : Not at all  Social Connections: Socially Isolated (07/13/2021)   Social Connection and Isolation Panel [NHANES]    Frequency of Communication with Friends and Family: More than three times a week    Frequency of Social Gatherings with Friends and Family: Once a week    Attends Religious Services: Never    Marine scientist or Organizations: No    Attends Archivist Meetings: Never    Marital Status: Widowed  Intimate Partner Violence: Not At Risk (07/13/2021)   Humiliation, Afraid, Rape, and Kick questionnaire    Fear of Current or Ex-Partner: No    Emotionally Abused: No    Physically Abused: No    Sexually  Abused: No   Family History:  Family History  Problem Relation Age of Onset   Coronary artery disease Father        died at 16   COPD Father    Alcohol abuse Father    Diabetes Mellitus II Father    Coronary artery disease Sister    Cancer Sister        breast   Heart attack Brother  Hyperlipidemia Other        died 41- "natural causes"   Hyperlipidemia Other        4 siblings    Hypertension Other        3 siblings    Review of Systems: Constitutional: Doesn't report fevers, chills or abnormal weight loss Eyes: Doesn't report blurriness of vision Ears, nose, mouth, throat, and face: Doesn't report sore throat Respiratory: Doesn't report cough, dyspnea or wheezes Cardiovascular: Doesn't report palpitation, chest discomfort  Gastrointestinal:  Doesn't report nausea, constipation, diarrhea GU: Doesn't report incontinence Skin: Doesn't report skin rashes Neurological: Per HPI Musculoskeletal: Doesn't report joint pain Behavioral/Psych: Doesn't report anxiety  Physical Exam: Vitals:   03/16/22 1408  BP: 115/60  Pulse: 80  Resp: 18  Temp: (!) 97.5 F (36.4 C)  SpO2: 98%   KPS: 70. General: Alert, cooperative, pleasant, in no acute distress Head: Normal EENT: No conjunctival injection or scleral icterus.  Lungs: Resp effort normal Cardiac: Regular rate Abdomen: Non-distended abdomen Skin: No rashes cyanosis or petechiae. Extremities: No clubbing or edema  Neurologic Exam: Mental Status: Awake, alert, attentive to examiner. Oriented to self and environment. Language is fluent with intact comprehension.  Cranial Nerves: Visual acuity is grossly normal. Visual fields are full. Extra-ocular movements intact. No ptosis. Face is symmetric Motor: Tone and bulk are normal. Power is full in both arms and legs. Reflexes are symmetric, no pathologic reflexes present.  Sensory: Impaired to light touch on L Gait: Dystaxic   Labs: I have reviewed the data as listed     Component Value Date/Time   NA 137 03/01/2022 1517   K 4.0 03/01/2022 1517   CL 98 03/01/2022 1517   CO2 30 03/01/2022 1517   GLUCOSE 117 (H) 03/01/2022 1517   BUN 12 03/01/2022 1517   CREATININE 0.62 03/01/2022 1517   CREATININE 0.68 11/18/2012 1439   CALCIUM 10.1 03/01/2022 1517   PROT 7.2 03/01/2022 1517   ALBUMIN 4.7 03/01/2022 1517   AST 17 03/01/2022 1517   ALT 22 03/01/2022 1517   ALKPHOS 61 03/01/2022 1517   BILITOT 0.4 03/01/2022 1517   GFRNONAA >60 03/01/2022 1517   GFRNONAA >89 11/18/2012 1439   GFRAA >60 09/30/2019 1050   GFRAA >89 11/18/2012 1439   Lab Results  Component Value Date   WBC 4.2 03/01/2022   NEUTROABS 2.4 03/01/2022   HGB 12.4 03/01/2022   HCT 36.0 03/01/2022   MCV 95.7 03/01/2022   PLT 166 03/01/2022    Imaging:  NM PET Image Restag (PS) Skull Base To Thigh  Result Date: 03/12/2022 CLINICAL DATA:  Subsequent treatment strategy for high-grade B-cell lymphoma. EXAM: NUCLEAR MEDICINE PET SKULL BASE TO THIGH TECHNIQUE: 7.23 mCi F-18 FDG was injected intravenously. Full-ring PET imaging was performed from the skull base to thigh after the radiotracer. CT data was obtained and used for attenuation correction and anatomic localization. Fasting blood glucose: 120 mg/dl COMPARISON:  Multiple priors including most recent CT February 21, 2022 and PET-CT September 28, 2021 FINDINGS: Mediastinal blood pool activity: SUV max 2.0 Liver activity: SUV max 3.4 NECK: No hypermetabolic cervical adenopathy. Low-level metabolic activity within a fluid level in the right maxillary sinus with a max SUV of 3.6. Incidental CT findings: None. CHEST: Multifocal and confluence ground-glass opacities in the right lower lobe are hypermetabolic with a max SUV of 10.5. No hypermetabolic thoracic adenopathy. Incidental CT findings: Right chest Port-A-Cath with tip in the right atrium. ABDOMEN/PELVIS: No abnormal hypermetabolic activity within the liver, pancreas,  or spleen. No  hypermetabolic lymph nodes in the abdomen or pelvis. Minimally metabolic hypodense 12 mm right adrenal nodule measures Hounsfield units of 3 with max SUV 2.6 compatible with a benign adrenal adenoma. Incidental CT findings: Aortic atherosclerosis. SKELETON: No focal hypermetabolic activity to suggest skeletal metastasis. Incidental CT findings: None. IMPRESSION: 1. Multifocal and confluence ground-glass opacities in the right lower lobe have increased in comparison to chest CT February 21, 2022 and are hypermetabolic, nonspecific but favored to reflect an infectious or inflammatory process. Suggest attention on short-term interval follow-up chest CT in 3 months to ensure resolution. ([place] X) 2. No hypermetabolic adenopathy above or below the diaphragm. No splenomegaly. 3. Low-level metabolic activity within a fluid level in the right maxillary sinus may reflect acute sinusitis. 4. Benign right adrenal adenoma. 5.  Aortic Atherosclerosis (ICD10-I70.0). Electronically Signed   By: Dahlia Bailiff M.D.   On: 03/12/2022 08:52   CT CHEST ABDOMEN PELVIS W CONTRAST  Result Date: 02/21/2022 CLINICAL DATA:  67 year old female with history of generalized weakness. History of lymphoma. * Tracking Code: BO * EXAM: CT CHEST, ABDOMEN, AND PELVIS WITH CONTRAST TECHNIQUE: Multidetector CT imaging of the chest, abdomen and pelvis was performed following the standard protocol during bolus administration of intravenous contrast. RADIATION DOSE REDUCTION: This exam was performed according to the departmental dose-optimization program which includes automated exposure control, adjustment of the mA and/or kV according to patient size and/or use of iterative reconstruction technique. CONTRAST:  116m OMNIPAQUE IOHEXOL 300 MG/ML  SOLN COMPARISON:  CT of the chest, abdomen and pelvis 02/08/2021. PET-CT 09/28/2021. FINDINGS: CT CHEST FINDINGS Cardiovascular: Heart size is normal. There is no significant pericardial fluid, thickening  or pericardial calcification. Atherosclerotic calcifications in the thoracic aorta. No definite coronary artery calcifications. Right internal jugular single-lumen Port-A-Cath with tip terminating in the right atrium. Mediastinum/Nodes: No pathologically enlarged mediastinal or hilar lymph nodes. Esophagus is unremarkable in appearance. No axillary lymphadenopathy. Lungs/Pleura: No acute consolidative airspace disease. No pleural effusions. No suspicious appearing pulmonary nodules or masses are noted. Musculoskeletal: There are no aggressive appearing lytic or blastic lesions noted in the visualized portions of the skeleton. CT ABDOMEN PELVIS FINDINGS Hepatobiliary: No suspicious cystic or solid hepatic lesions. No intra or extrahepatic biliary ductal dilatation. Gallbladder is normal in appearance. Pancreas: No pancreatic mass. No pancreatic ductal dilatation. No pancreatic or peripancreatic fluid collections or inflammatory changes. Spleen: Unremarkable. Adrenals/Urinary Tract: Right adrenal nodule measuring 2.3 x 1.5 cm, slightly larger than prior examinations (previously 1.7 x 1.3 cm on prior CT examination 12/09/2020), but previously not hypermetabolic on PET-CT 082/64/1583 Bilateral kidneys and left adrenal gland are normal in appearance. No hydroureteronephrosis. Urinary bladder is normal in appearance. Stomach/Bowel: The appearance of the stomach is normal. There is no pathologic dilatation of small bowel or colon. The appendix is not confidently identified and may be surgically absent. Regardless, there are no inflammatory changes noted adjacent to the cecum to suggest the presence of an acute appendicitis at this time. Vascular/Lymphatic: Aortic atherosclerosis. No lymphadenopathy noted in the abdomen or pelvis. Reproductive: Uterus and ovaries are unremarkable in appearance. Other: No significant volume of ascites.  No pneumoperitoneum. Musculoskeletal: Status post bilateral hip arthroplasty. There are  no aggressive appearing lytic or blastic lesions noted in the visualized portions of the skeleton. IMPRESSION: 1. No definite findings to suggest recurrent lymphoma in the chest, abdomen or pelvis. 2. The patient has had a right adrenal nodule for some time. This has shown some slight interval growth, but was previously  not hypermetabolic on prior PET-CT. This is nonspecific, but favored to be benign. Close attention on follow-up studies is recommended. 3. Aortic atherosclerosis. 4. Additional incidental findings, as above. Electronically Signed   By: Vinnie Langton M.D.   On: 02/21/2022 09:54   CT Head Wo Contrast  Result Date: 02/21/2022 CLINICAL DATA:  Brain metastases suspected.  History of lymphoma. EXAM: CT HEAD WITHOUT CONTRAST TECHNIQUE: Contiguous axial images were obtained from the base of the skull through the vertex without intravenous contrast. RADIATION DOSE REDUCTION: This exam was performed according to the departmental dose-optimization program which includes automated exposure control, adjustment of the mA and/or kV according to patient size and/or use of iterative reconstruction technique. COMPARISON:  MRI 02/13/2022 FINDINGS: Brain: Abnormal edema evident within the left middle cerebellar peduncle and left cerebellum consistent with recurrent disease shown by MRI. Small region of edema in the left frontal operculum also consistent with recurrent disease as shown by prior MRI. Chronic encephalomalacia of the posterior left temporal lobe as seen previously. No change when compared to the recent MRI. No hydrocephalus, hemorrhage or extra-axial collection. Very slight hyperdensity in the left cerebellar region can be seen with lymphoma involvement. Vascular: There is atherosclerotic calcification of the major vessels at the base of the brain. Skull: Negative Sinuses/Orbits: Clear/normal Other: None IMPRESSION: 1. Abnormal edema in the left middle cerebellar peduncle, left cerebellum and left  frontal operculum consistent with recurrent disease as shown by MRI. No change when compared to the recent MRI. 2. Chronic encephalomalacia of the posterior left temporal lobe as seen previously. 3. Very slight hyperdensity in the left cerebellar region can be seen with lymphoma involvement. Electronically Signed   By: [name] Chimes M.D.   On: 02/21/2022 09:41    [facility] Clinician Interpretation: I have personally reviewed the radiological images as listed.  My interpretation, in the context of the patient's clinical presentation, is progressive disease   Assessment/Plan CNS lymphoma ([place])  Gayatri Teasdale is clinically and radiographically progressive with regards to her CNS burden of DLBCL.  Her clinical deficits are mostly secondary to her tumor burden with the posterior fossa, including severe nausea and vomiting.  Previously she had exhibited complete response to 4 cycles of HD-MTX+rituximab.  It would be reasonable to resume this therapy if tolerated, preferred by patient and provider.  Would recommend ~6 cycles with an MRI brain after cycles 3 and 6.  Other salvage options were discussed, including high dose whole brain radiation, and SRS.  These were discussed in detail with Dr. Isidore Moos as well.    We did recommend resumption of decadron '4mg'$  daily to alleviate symptoms, this could be dose reduced to '2mg'$  daily if mood/irritability effects recur.  This could be withdrawn once treatment (either chemo or RT) is initiated.  We spent twenty additional minutes teaching regarding the natural history, biology, and historical experience in the treatment of neurologic complications of cancer.   We appreciate the opportunity to participate in the care of Ariel [name].  We are happy to contribute to her care as a consultant moving forward in whatever way deemed appropriate by primary oncology team.  All questions were answered. The patient knows to call the clinic with any problems, questions or concerns. No  barriers to learning were detected.  The total time spent in the encounter was 60 minutes and more than 50% was on counseling and review of test results   Ariel Sellers, MD Medical Director of Neuro-Oncology [name] at [facility]  03/16/22 2:09 PM

## 2022-03-17 ENCOUNTER — Inpatient Hospital Stay (HOSPITAL_COMMUNITY): Payer: Medicare Other

## 2022-03-17 ENCOUNTER — Emergency Department (HOSPITAL_COMMUNITY): Payer: Medicare Other

## 2022-03-17 ENCOUNTER — Other Ambulatory Visit: Payer: Self-pay

## 2022-03-17 ENCOUNTER — Encounter (HOSPITAL_COMMUNITY): Payer: Self-pay

## 2022-03-17 ENCOUNTER — Inpatient Hospital Stay (HOSPITAL_COMMUNITY)
Admission: EM | Admit: 2022-03-17 | Discharge: 2022-04-14 | DRG: 840 | Disposition: A | Discharge status: Home / Self Care | Payer: Medicare Other | Attending: Internal Medicine | Admitting: Internal Medicine

## 2022-03-17 DIAGNOSIS — R058 Other specified cough: Secondary | ICD-10-CM | POA: Diagnosis not present

## 2022-03-17 DIAGNOSIS — Z809 Family history of malignant neoplasm, unspecified: Secondary | ICD-10-CM

## 2022-03-17 DIAGNOSIS — Z8249 Family history of ischemic heart disease and other diseases of the circulatory system: Secondary | ICD-10-CM

## 2022-03-17 DIAGNOSIS — Z7952 Long term (current) use of systemic steroids: Secondary | ICD-10-CM

## 2022-03-17 DIAGNOSIS — R040 Epistaxis: Secondary | ICD-10-CM | POA: Diagnosis not present

## 2022-03-17 DIAGNOSIS — C8334 Diffuse large B-cell lymphoma, lymph nodes of axilla and upper limb: Secondary | ICD-10-CM

## 2022-03-17 DIAGNOSIS — I499 Cardiac arrhythmia, unspecified: Secondary | ICD-10-CM | POA: Diagnosis not present

## 2022-03-17 DIAGNOSIS — J1282 Pneumonia due to coronavirus disease 2019: Secondary | ICD-10-CM | POA: Diagnosis not present

## 2022-03-17 DIAGNOSIS — B9620 Unspecified Escherichia coli [E. coli] as the cause of diseases classified elsewhere: Secondary | ICD-10-CM | POA: Diagnosis present

## 2022-03-17 DIAGNOSIS — D75838 Other thrombocytosis: Secondary | ICD-10-CM | POA: Diagnosis not present

## 2022-03-17 DIAGNOSIS — K5900 Constipation, unspecified: Secondary | ICD-10-CM | POA: Diagnosis not present

## 2022-03-17 DIAGNOSIS — J189 Pneumonia, unspecified organism: Secondary | ICD-10-CM | POA: Diagnosis not present

## 2022-03-17 DIAGNOSIS — J9811 Atelectasis: Secondary | ICD-10-CM | POA: Diagnosis not present

## 2022-03-17 DIAGNOSIS — R748 Abnormal levels of other serum enzymes: Secondary | ICD-10-CM | POA: Diagnosis not present

## 2022-03-17 DIAGNOSIS — E871 Hypo-osmolality and hyponatremia: Secondary | ICD-10-CM | POA: Diagnosis not present

## 2022-03-17 DIAGNOSIS — Z602 Problems related to living alone: Secondary | ICD-10-CM | POA: Diagnosis present

## 2022-03-17 DIAGNOSIS — R29810 Facial weakness: Secondary | ICD-10-CM | POA: Diagnosis not present

## 2022-03-17 DIAGNOSIS — D72829 Elevated white blood cell count, unspecified: Secondary | ICD-10-CM | POA: Diagnosis present

## 2022-03-17 DIAGNOSIS — Z515 Encounter for palliative care: Secondary | ICD-10-CM | POA: Diagnosis not present

## 2022-03-17 DIAGNOSIS — E222 Syndrome of inappropriate secretion of antidiuretic hormone: Secondary | ICD-10-CM | POA: Diagnosis present

## 2022-03-17 DIAGNOSIS — D702 Other drug-induced agranulocytosis: Secondary | ICD-10-CM | POA: Diagnosis not present

## 2022-03-17 DIAGNOSIS — Z789 Other specified health status: Secondary | ICD-10-CM

## 2022-03-17 DIAGNOSIS — C4371 Malignant melanoma of right lower limb, including hip: Secondary | ICD-10-CM | POA: Diagnosis present

## 2022-03-17 DIAGNOSIS — C8339 Diffuse large B-cell lymphoma, extranodal and solid organ sites: Secondary | ICD-10-CM | POA: Diagnosis not present

## 2022-03-17 DIAGNOSIS — R5383 Other fatigue: Secondary | ICD-10-CM | POA: Diagnosis not present

## 2022-03-17 DIAGNOSIS — K1231 Oral mucositis (ulcerative) due to antineoplastic therapy: Secondary | ICD-10-CM | POA: Diagnosis not present

## 2022-03-17 DIAGNOSIS — Z888 Allergy status to other drugs, medicaments and biological substances status: Secondary | ICD-10-CM

## 2022-03-17 DIAGNOSIS — C4359 Malignant melanoma of other part of trunk: Secondary | ICD-10-CM

## 2022-03-17 DIAGNOSIS — R29818 Other symptoms and signs involving the nervous system: Secondary | ICD-10-CM | POA: Diagnosis not present

## 2022-03-17 DIAGNOSIS — C8331 Diffuse large B-cell lymphoma, lymph nodes of head, face, and neck: Secondary | ICD-10-CM | POA: Diagnosis not present

## 2022-03-17 DIAGNOSIS — N390 Urinary tract infection, site not specified: Secondary | ICD-10-CM | POA: Diagnosis present

## 2022-03-17 DIAGNOSIS — R4182 Altered mental status, unspecified: Secondary | ICD-10-CM | POA: Diagnosis not present

## 2022-03-17 DIAGNOSIS — Z6821 Body mass index (BMI) 21.0-21.9, adult: Secondary | ICD-10-CM | POA: Diagnosis not present

## 2022-03-17 DIAGNOSIS — Z751 Person awaiting admission to adequate facility elsewhere: Secondary | ICD-10-CM

## 2022-03-17 DIAGNOSIS — Z7189 Other specified counseling: Secondary | ICD-10-CM | POA: Diagnosis not present

## 2022-03-17 DIAGNOSIS — Z833 Family history of diabetes mellitus: Secondary | ICD-10-CM

## 2022-03-17 DIAGNOSIS — Z743 Need for continuous supervision: Secondary | ICD-10-CM | POA: Diagnosis not present

## 2022-03-17 DIAGNOSIS — C8330 Diffuse large B-cell lymphoma, unspecified site: Secondary | ICD-10-CM | POA: Diagnosis not present

## 2022-03-17 DIAGNOSIS — Z825 Family history of asthma and other chronic lower respiratory diseases: Secondary | ICD-10-CM

## 2022-03-17 DIAGNOSIS — R4701 Aphasia: Secondary | ICD-10-CM | POA: Diagnosis present

## 2022-03-17 DIAGNOSIS — B370 Candidal stomatitis: Secondary | ICD-10-CM | POA: Diagnosis not present

## 2022-03-17 DIAGNOSIS — E7800 Pure hypercholesterolemia, unspecified: Secondary | ICD-10-CM | POA: Diagnosis present

## 2022-03-17 DIAGNOSIS — R112 Nausea with vomiting, unspecified: Secondary | ICD-10-CM | POA: Diagnosis not present

## 2022-03-17 DIAGNOSIS — E440 Moderate protein-calorie malnutrition: Secondary | ICD-10-CM | POA: Diagnosis not present

## 2022-03-17 DIAGNOSIS — C8589 Other specified types of non-Hodgkin lymphoma, extranodal and solid organ sites: Principal | ICD-10-CM | POA: Diagnosis present

## 2022-03-17 DIAGNOSIS — G936 Cerebral edema: Secondary | ICD-10-CM | POA: Diagnosis not present

## 2022-03-17 DIAGNOSIS — Z7982 Long term (current) use of aspirin: Secondary | ICD-10-CM

## 2022-03-17 DIAGNOSIS — E785 Hyperlipidemia, unspecified: Secondary | ICD-10-CM | POA: Diagnosis present

## 2022-03-17 DIAGNOSIS — D75839 Thrombocytosis, unspecified: Secondary | ICD-10-CM | POA: Diagnosis present

## 2022-03-17 DIAGNOSIS — R4781 Slurred speech: Secondary | ICD-10-CM | POA: Diagnosis not present

## 2022-03-17 DIAGNOSIS — C8510 Unspecified B-cell lymphoma, unspecified site: Secondary | ICD-10-CM | POA: Diagnosis present

## 2022-03-17 DIAGNOSIS — D6481 Anemia due to antineoplastic chemotherapy: Secondary | ICD-10-CM | POA: Diagnosis not present

## 2022-03-17 DIAGNOSIS — Z8616 Personal history of COVID-19: Secondary | ICD-10-CM | POA: Diagnosis not present

## 2022-03-17 DIAGNOSIS — D6959 Other secondary thrombocytopenia: Secondary | ICD-10-CM | POA: Diagnosis not present

## 2022-03-17 DIAGNOSIS — T451X5A Adverse effect of antineoplastic and immunosuppressive drugs, initial encounter: Secondary | ICD-10-CM | POA: Diagnosis present

## 2022-03-17 DIAGNOSIS — C439 Malignant melanoma of skin, unspecified: Secondary | ICD-10-CM | POA: Diagnosis present

## 2022-03-17 DIAGNOSIS — U071 COVID-19: Secondary | ICD-10-CM | POA: Diagnosis present

## 2022-03-17 DIAGNOSIS — R509 Fever, unspecified: Secondary | ICD-10-CM | POA: Diagnosis not present

## 2022-03-17 DIAGNOSIS — C7931 Secondary malignant neoplasm of brain: Secondary | ICD-10-CM | POA: Diagnosis not present

## 2022-03-17 DIAGNOSIS — R000 Tachycardia, unspecified: Secondary | ICD-10-CM | POA: Diagnosis not present

## 2022-03-17 DIAGNOSIS — M1611 Unilateral primary osteoarthritis, right hip: Secondary | ICD-10-CM

## 2022-03-17 DIAGNOSIS — D500 Iron deficiency anemia secondary to blood loss (chronic): Secondary | ICD-10-CM

## 2022-03-17 DIAGNOSIS — Z8673 Personal history of transient ischemic attack (TIA), and cerebral infarction without residual deficits: Secondary | ICD-10-CM

## 2022-03-17 DIAGNOSIS — E869 Volume depletion, unspecified: Secondary | ICD-10-CM | POA: Diagnosis not present

## 2022-03-17 DIAGNOSIS — E876 Hypokalemia: Secondary | ICD-10-CM | POA: Diagnosis not present

## 2022-03-17 DIAGNOSIS — R471 Dysarthria and anarthria: Secondary | ICD-10-CM | POA: Diagnosis present

## 2022-03-17 DIAGNOSIS — Z96643 Presence of artificial hip joint, bilateral: Secondary | ICD-10-CM | POA: Diagnosis present

## 2022-03-17 DIAGNOSIS — R2990 Unspecified symptoms and signs involving the nervous system: Secondary | ICD-10-CM | POA: Diagnosis not present

## 2022-03-17 DIAGNOSIS — C8332 Diffuse large B-cell lymphoma, intrathoracic lymph nodes: Secondary | ICD-10-CM

## 2022-03-17 DIAGNOSIS — Z79631 Long term (current) use of antimetabolite agent: Secondary | ICD-10-CM

## 2022-03-17 DIAGNOSIS — R059 Cough, unspecified: Secondary | ICD-10-CM | POA: Diagnosis not present

## 2022-03-17 DIAGNOSIS — D61818 Other pancytopenia: Secondary | ICD-10-CM | POA: Diagnosis present

## 2022-03-17 DIAGNOSIS — D696 Thrombocytopenia, unspecified: Secondary | ICD-10-CM | POA: Diagnosis not present

## 2022-03-17 DIAGNOSIS — Z7401 Bed confinement status: Secondary | ICD-10-CM | POA: Diagnosis not present

## 2022-03-17 DIAGNOSIS — R053 Chronic cough: Secondary | ICD-10-CM | POA: Diagnosis not present

## 2022-03-17 DIAGNOSIS — G9389 Other specified disorders of brain: Secondary | ICD-10-CM | POA: Diagnosis not present

## 2022-03-17 DIAGNOSIS — R0902 Hypoxemia: Secondary | ICD-10-CM | POA: Diagnosis not present

## 2022-03-17 DIAGNOSIS — R531 Weakness: Secondary | ICD-10-CM | POA: Diagnosis not present

## 2022-03-17 DIAGNOSIS — R945 Abnormal results of liver function studies: Secondary | ICD-10-CM | POA: Diagnosis not present

## 2022-03-17 DIAGNOSIS — Z5111 Encounter for antineoplastic chemotherapy: Secondary | ICD-10-CM

## 2022-03-17 DIAGNOSIS — Z79899 Other long term (current) drug therapy: Secondary | ICD-10-CM | POA: Diagnosis not present

## 2022-03-17 DIAGNOSIS — R5081 Fever presenting with conditions classified elsewhere: Secondary | ICD-10-CM | POA: Diagnosis not present

## 2022-03-17 DIAGNOSIS — R6889 Other general symptoms and signs: Secondary | ICD-10-CM | POA: Diagnosis not present

## 2022-03-17 DIAGNOSIS — D72819 Decreased white blood cell count, unspecified: Secondary | ICD-10-CM | POA: Diagnosis not present

## 2022-03-17 DIAGNOSIS — Z885 Allergy status to narcotic agent status: Secondary | ICD-10-CM

## 2022-03-17 HISTORY — DX: Cerebral infarction, unspecified: I63.9

## 2022-03-17 LAB — COMPREHENSIVE METABOLIC PANEL
ALT: 27 U/L (ref 0–44)
AST: 24 U/L (ref 15–41)
Albumin: 3.8 g/dL (ref 3.5–5.0)
Alkaline Phosphatase: 74 U/L (ref 38–126)
Anion gap: 13 (ref 5–15)
BUN: 10 mg/dL (ref 8–23)
CO2: 24 mmol/L (ref 22–32)
Calcium: 9.8 mg/dL (ref 8.9–10.3)
Chloride: 93 mmol/L — ABNORMAL LOW (ref 98–111)
Creatinine, Ser: 0.77 mg/dL (ref 0.44–1.00)
GFR, Estimated: >60 mL/min (ref >60)
Glucose, Bld: 110 mg/dL — ABNORMAL HIGH (ref 70–99)
Potassium: 3.7 mmol/L (ref 3.5–5.1)
Sodium: 130 mmol/L — ABNORMAL LOW (ref 135–145)
Total Bilirubin: 0.5 mg/dL (ref 0.3–1.2)
Total Protein: 7 g/dL (ref 6.5–8.1)

## 2022-03-17 LAB — CBC
HCT: 35.7 % — ABNORMAL LOW (ref 36.0–46.0)
Hemoglobin: 12.9 g/dL (ref 12.0–15.0)
MCH: 33 pg (ref 26.0–34.0)
MCHC: 36.1 g/dL — ABNORMAL HIGH (ref 30.0–36.0)
MCV: 91.3 fL (ref 80.0–100.0)
Platelets: 146 10*3/uL — ABNORMAL LOW (ref 150–400)
RBC: 3.91 MIL/uL (ref 3.87–5.11)
RDW: 11.9 % (ref 11.5–15.5)
WBC: 5.5 10*3/uL (ref 4.0–10.5)
nRBC: 0 % (ref 0.0–0.2)

## 2022-03-17 LAB — RESP PANEL BY RT-PCR (RSV, FLU A&B, COVID)  RVPGX2
Influenza A by PCR: NEGATIVE
Influenza B by PCR: NEGATIVE
Resp Syncytial Virus by PCR: NEGATIVE
SARS Coronavirus 2 by RT PCR: NEGATIVE

## 2022-03-17 LAB — ETHANOL: Alcohol, Ethyl (B): <10 mg/dL (ref <10)

## 2022-03-17 LAB — DIFFERENTIAL
Abs Immature Granulocytes: 0.03 10*3/uL (ref 0.00–0.07)
Basophils Absolute: 0 10*3/uL (ref 0.0–0.1)
Basophils Relative: 0 %
Eosinophils Absolute: 0 10*3/uL (ref 0.0–0.5)
Eosinophils Relative: 0 %
Immature Granulocytes: 1 %
Lymphocytes Relative: 22 %
Lymphs Abs: 1.2 10*3/uL (ref 0.7–4.0)
Monocytes Absolute: 0.6 10*3/uL (ref 0.1–1.0)
Monocytes Relative: 11 %
Neutro Abs: 3.7 10*3/uL (ref 1.7–7.7)
Neutrophils Relative %: 66 %

## 2022-03-17 LAB — CBG MONITORING, ED: Glucose-Capillary: 111 mg/dL — ABNORMAL HIGH (ref 70–99)

## 2022-03-17 LAB — I-STAT CHEM 8, ED
BUN: 12 mg/dL (ref 8–23)
Calcium, Ion: 1.04 mmol/L — ABNORMAL LOW (ref 1.15–1.40)
Chloride: 96 mmol/L — ABNORMAL LOW (ref 98–111)
Creatinine, Ser: 0.6 mg/dL (ref 0.44–1.00)
Glucose, Bld: 109 mg/dL — ABNORMAL HIGH (ref 70–99)
HCT: 34 % — ABNORMAL LOW (ref 36.0–46.0)
Hemoglobin: 11.6 g/dL — ABNORMAL LOW (ref 12.0–15.0)
Potassium: 3.6 mmol/L (ref 3.5–5.1)
Sodium: 132 mmol/L — ABNORMAL LOW (ref 135–145)
TCO2: 22 mmol/L (ref 22–32)

## 2022-03-17 LAB — APTT: aPTT: 29 seconds (ref 24–36)

## 2022-03-17 LAB — PROTIME-INR
INR: 0.9 (ref 0.8–1.2)
Prothrombin Time: 12.2 seconds (ref 11.4–15.2)

## 2022-03-17 MED ORDER — DEXAMETHASONE SODIUM PHOSPHATE 10 MG/ML IJ SOLN
10.0000 mg | Freq: Once | INTRAMUSCULAR | Status: AC
  Administered 2022-03-17: 10 mg via INTRAVENOUS
  Filled 2022-03-17: qty 1

## 2022-03-17 MED ORDER — POTASSIUM CHLORIDE IN NACL 20-0.9 MEQ/L-% IV SOLN
INTRAVENOUS | Status: DC
  Filled 2022-03-17 (×2): qty 1000

## 2022-03-17 MED ORDER — PROCHLORPERAZINE EDISYLATE 10 MG/2ML IJ SOLN: 10.0000 mg | Freq: Four times a day (QID) | INTRAMUSCULAR | Status: DC | PRN

## 2022-03-17 MED ORDER — POLYETHYLENE GLYCOL 3350 17 G PO PACK: 17.0000 g | PACK | Freq: Every day | ORAL | Status: DC | PRN

## 2022-03-17 MED ORDER — PANTOPRAZOLE SODIUM 40 MG IV SOLR
40.0000 mg | Freq: Every day | INTRAVENOUS | Status: DC
  Administered 2022-03-17 – 2022-03-18 (×2): 40 mg via INTRAVENOUS
  Filled 2022-03-17 (×2): qty 10

## 2022-03-17 MED ORDER — ONDANSETRON HCL 4 MG/2ML IJ SOLN
INTRAMUSCULAR | Status: AC
  Administered 2022-03-17: 4 mg via INTRAVENOUS
  Filled 2022-03-17: qty 2

## 2022-03-17 MED ORDER — ENOXAPARIN SODIUM 40 MG/0.4ML IJ SOSY
40.0000 mg | PREFILLED_SYRINGE | INTRAMUSCULAR | Status: DC
  Administered 2022-03-17: 40 mg via SUBCUTANEOUS
  Filled 2022-03-17: qty 0.4

## 2022-03-17 MED ORDER — PROCHLORPERAZINE EDISYLATE 10 MG/2ML IJ SOLN
10.0000 mg | Freq: Once | INTRAMUSCULAR | Status: AC
  Administered 2022-03-17: 10 mg via INTRAVENOUS
  Filled 2022-03-17: qty 2

## 2022-03-17 MED ORDER — ONDANSETRON HCL 4 MG/2ML IJ SOLN
4.0000 mg | Freq: Four times a day (QID) | INTRAMUSCULAR | Status: DC | PRN
  Administered 2022-03-17: 4 mg via INTRAVENOUS
  Filled 2022-03-17: qty 2

## 2022-03-17 MED ORDER — ALBUTEROL SULFATE (2.5 MG/3ML) 0.083% IN NEBU: 2.5000 mg | INHALATION_SOLUTION | RESPIRATORY_TRACT | Status: DC | PRN

## 2022-03-17 MED ORDER — ACETAMINOPHEN 325 MG PO TABS
650.0000 mg | ORAL_TABLET | Freq: Four times a day (QID) | ORAL | Status: DC | PRN
  Administered 2022-03-19 – 2022-04-04 (×10): 650 mg via ORAL
  Filled 2022-03-17 (×14): qty 2

## 2022-03-17 MED ORDER — ONDANSETRON HCL 4 MG PO TABS: 4.0000 mg | ORAL_TABLET | Freq: Four times a day (QID) | ORAL | Status: DC | PRN

## 2022-03-17 MED ORDER — DIPHENHYDRAMINE HCL 50 MG/ML IJ SOLN
12.5000 mg | Freq: Once | INTRAMUSCULAR | Status: AC
  Administered 2022-03-17: 12.5 mg via INTRAVENOUS
  Filled 2022-03-17: qty 1

## 2022-03-17 MED ORDER — DEXAMETHASONE SODIUM PHOSPHATE 10 MG/ML IJ SOLN: 10.0000 mg | Freq: Once | INTRAMUSCULAR | Status: DC

## 2022-03-17 MED ORDER — LORAZEPAM 2 MG/ML IJ SOLN: 1.0000 mg | INTRAMUSCULAR | Status: DC

## 2022-03-17 MED ORDER — ONDANSETRON HCL 4 MG/2ML IJ SOLN: 4.0000 mg | Freq: Once | INTRAMUSCULAR | Status: AC

## 2022-03-17 MED ORDER — ACETAMINOPHEN 650 MG RE SUPP: 650.0000 mg | Freq: Four times a day (QID) | RECTAL | Status: DC | PRN

## 2022-03-17 MED ORDER — SODIUM CHLORIDE 0.9% FLUSH
3.0000 mL | Freq: Once | INTRAVENOUS | Status: AC
  Administered 2022-03-17: 3 mL via INTRAVENOUS

## 2022-03-17 MED ORDER — GADOBUTROL 1 MMOL/ML IV SOLN
7.0000 mL | Freq: Once | INTRAVENOUS | Status: AC | PRN
  Administered 2022-03-17: 7 mL via INTRAVENOUS

## 2022-03-17 MED ORDER — HYDRALAZINE HCL 20 MG/ML IJ SOLN: 10.0000 mg | Freq: Three times a day (TID) | INTRAMUSCULAR | Status: DC | PRN

## 2022-03-17 NOTE — H&P (Signed)
History and Physical    Patient: Ariel [name] GGY:[id number] DOB: Feb 14, 1956 DOA: 03/17/2022 DOS: the patient was seen and examined on 03/17/2022 PCP: [name], NP  Patient coming from: Home  Chief Complaint: Aphasia, slurred speech  Chief Complaint  Patient presents with   Code Stroke   HPI: [name] is a 67 y.o. female with medical history significant of CNS lymphoma, melanoma of the skin, high-grade B-cell lymphoma who presented to [facility] ED on 1/19 via EMS from home with right facial droop, dysarthria, aphasia.  Last known normal 1800 last night.  Additionally with complaints of nausea/vomiting.  Patient with difficulty finding her words which is new.  Patient is followed outpatient by oncology Dr. Marin Olp and neuro oncology Dr. Mickeal Skinner.  On arrival to the ED code stroke was initiated and neurology was consulted.  Patient currently with no specific complaints, specifically denies headache, no dizziness, no chest pain, no shortness of breath, no abdominal pain, no urinary symptoms, no cough/congestion; although she remains confused.  Unable to obtain any further ROS from patient due to her current mental status  In the ED, temperature 100.4 F, HR 106, RR 20, BP 142/73, SpO2 98% on room air.  130, potassium 3.7, chloride 93, CO2 24, glucose 110, BUN 10, creatinine 0.77.  AST 24, ALT 27, total bilirubin 0.5.  WBC 5.5, hemoglobin 12.9, platelets 146.  EtOH level less than 10.  INR 0.9.  Chest x-ray with no acute cardiopulmonary disease process.  CT head code stroke with increased edema left frontal lesion, prior deep left cerebellar lesion on prior MRI largely occult by CT, no acute hemorrhage or visible infarct, stable left temporal lobe encephalomalacia.  EKG with sinus tachycardia, rate 101, QTc 422, no concerning ST elevation/depression or T wave inversions.  Neurology was consulted with recommendations of EEG, MRI brain with and without contrast, Decadron 10 mg IV x 1; and  recommendations of medical admission.  TRH consulted by EDP for admission for further evaluation and management of aphasia in the setting of CNS lymphoma with increased edema by CT head.   Review of Systems: As mentioned in the history of present illness. All other systems reviewed and are negative. Past Medical History:  Diagnosis Date   Arthritis    Chicken pox    Complication of anesthesia    Per patient, very slow to wake up after anesthesia   Goals of care, counseling/discussion 12/22/2020   High cholesterol    High grade B-cell lymphoma ([place]) 12/22/2020   Hyperglycemia 11/10/2020   Melanoma ([place]) 2021   right hip   Melanoma ([place]) 10/28/2020   pt stated brain liver and bladder   PONV (postoperative nausea and vomiting)    Stroke [facility])    Urinary incontinence    Past Surgical History:  Procedure Laterality Date   AIR/FLUID EXCHANGE Right 12/07/2020   Procedure: AIR/FLUID EXCHANGE;  Surgeon: Jalene Mullet, MD;  Location: [place];  Service: Ophthalmology;  Laterality: Right;   APPENDECTOMY  1962   BIOPSY  12/14/2020   Procedure: BIOPSY;  Surgeon: Rush Landmark Telford Nab., MD;  Location: [place];  Service: Gastroenterology;;   ESOPHAGOGASTRODUODENOSCOPY (EGD) WITH PROPOFOL N/A 12/14/2020   Procedure: ESOPHAGOGASTRODUODENOSCOPY (EGD) WITH PROPOFOL;  Surgeon: Irving Copas., MD;  Location: [place];  Service: Gastroenterology;  Laterality: N/A;   EXCISION MELANOMA WITH SENTINEL LYMPH NODE BIOPSY Right 10/09/2019   Procedure: WIDE LOCAL EXCISION WITH ADVANCEMENT FLAP CLOSURE RIGHT HIP MELANOMA WITH SENTINEL LYMPH NODE BIOPSY;  Surgeon: Stark Klein, MD;  Location:  [place] [facility];  Service: General;  Laterality: Right;   FINE NEEDLE ASPIRATION  12/14/2020   Procedure: FINE NEEDLE ASPIRATION (FNA) LINEAR;  Surgeon: Irving Copas., MD;  Location: [place];  Service: Gastroenterology;;   HERNIA REPAIR  2009   IR IMAGING GUIDED PORT INSERTION  12/30/2020   MOUTH  SURGERY  11/2015   gum surgery and bone implant   PARS PLANA VITRECTOMY Right 12/07/2020   Procedure: PARS PLANA VITRECTOMY WITH 25 GAUGE;  Surgeon: Jalene Mullet, MD;  Location: [place];  Service: Ophthalmology;  Laterality: Right;   PHOTOCOAGULATION WITH LASER Right 12/07/2020   Procedure: PHOTOCOAGULATION WITH ENDOLASER PANRITINAL COAGULATION;  Surgeon: Jalene Mullet, MD;  Location: [place];  Service: Ophthalmology;  Laterality: Right;   TOTAL HIP ARTHROPLASTY Left 10/11/2021   Procedure: LEFT TOTAL HIP ARTHROPLASTY ANTERIOR APPROACH;  Surgeon: Melrose Nakayama, MD;  Location: [facility];  Service: Orthopedics;  Laterality: Left;   TOTAL HIP ARTHROPLASTY Right 01/10/2022   Procedure: RIGHT TOTAL HIP ARTHROPLASTY ANTERIOR APPROACH;  Surgeon: Melrose Nakayama, MD;  Location: [facility];  Service: Orthopedics;  Laterality: Right;   UPPER ESOPHAGEAL ENDOSCOPIC ULTRASOUND (EUS) N/A 12/14/2020   Procedure: UPPER ESOPHAGEAL ENDOSCOPIC ULTRASOUND (EUS);  Surgeon: Irving Copas., MD;  Location: [place];  Service: Gastroenterology;  Laterality: N/A;   WISDOM TOOTH EXTRACTION     Social History:  reports that she has never smoked. She has never used smokeless tobacco. She reports that she does not drink alcohol and does not use drugs.  Allergies  Allergen Reactions   Decadron [Dexamethasone] Other (See Comments)    Made the patient's legs tired and she could not walk. Hallucinations   Plavix [Clopidogrel] Hives, Swelling and Other (See Comments)    Lips became swollen    Prednisone Other (See Comments)    Steroids caused psychological issues  Hallucinations   Codeine Nausea And Vomiting    Family History  Problem Relation Age of Onset   Coronary artery disease Father        died at 69   COPD Father    Alcohol abuse Father    Diabetes Mellitus II Father    Coronary artery disease Sister    Cancer Sister        breast   Heart attack Brother    Hyperlipidemia Other        died 49-  "natural causes"   Hyperlipidemia Other        4 siblings    Hypertension Other        3 siblings    Prior to Admission medications   Medication Sig Start Date End Date Taking? Authorizing Provider  acetaminophen (TYLENOL) 500 MG tablet Take 500-1,000 mg by mouth every 6 (six) hours as needed (for pain.). Patient not taking: Reported on 03/16/2022    [provider]  aspirin EC 81 MG tablet Take 1 tablet (81 mg total) by mouth 2 (two) times daily after a meal. For 2 weeks then once a day for 2 weeks for dvt prevention Patient not taking: Reported on 03/10/2022 01/10/22 01/10/23  Loni Dolly, PA-C  dexamethasone (DECADRON) 4 MG tablet Take 1 tablet (4 mg total) by mouth daily. 03/16/22   Volanda Napoleon, MD  Misc Natural Products (PRO NUTRIENTS FRUIT & VEGGIE PO) Take 4 capsules by mouth in the morning. Balance of Nature Fruit and Vegetable Supplements (2) Fruit + (2) Vegetable Patient not taking: Reported on 03/06/2022    [provider]  Multiple Vitamins-Minerals (MULTIVITAMIN WITH MINERALS)  tablet Take 1 tablet by mouth daily. Patient not taking: Reported on 03/10/2022 09/19/21   [name], NP  ondansetron (ZOFRAN) 8 MG tablet TAKE 1 TABLET BY MOUTH EVERY 8 HOURS AS NEEDED FOR NAUSEA OR VOMITING 03/14/22   Volanda Napoleon, MD    Physical Exam: Vitals:   03/17/22 1051 03/17/22 1100 03/17/22 1111 03/17/22 1145  BP:  (!) 158/74  126/60  Pulse: (!) 101 (!) 111 96 99  Resp: 16 (!) '31 18 18  '$ Temp:      TempSrc:      SpO2: 97% 100% 96% 97%  Weight:        GEN: 67 yo female in NAD, alert to person (President: Biden), Place (([place]), but not time (1927), thin in appearance, + dysarthria HEENT: NCAT, PERRL, EOMI, sclera clear, MMM PULM: CTAB w/o wheezes/crackles, normal respiratory effort, on room air CV: RRR w/o M/G/R GI: abd soft, NTND, NABS, no R/G/M MSK: no peripheral edema, moves all extremities independently with preserved muscle strength PSYCH: normal  mood/affect Integumentary: dry/intact, no rashes or wounds  Neuro Exam Mental Status: A&O x2, + dysarthria, no aphasia  Cranial Nerves: visual fields full, PERRL, EOMi, intact smooth pursuit, no nystagmus, no ptosis, facial sensation intact bilaterally, 5/5 jaw strength, smile asymmetric with mild right facial drooping,  eyebrow  raise & 5/5 eye closure symetric, palate elevates symmetrically, head turning and shoulder shrug intact and symetric bilaterally, tongue protrusion is midline Motor: no pronator drift, LUE 5/5,   LLE 5/5,   RUE 5/5,   RLE 5/5  normal muscle bulk no significant atrophy, normal tone, no spasticity or rigidity apperciated  Sensory: Sensation is intact to light touch Coordination/Movement: no tremor noted   Assessment/plan:  Aphasia with right-sided facial droop CNS lymphoma with increased edema on imaging Hx diffuse large B-cell lymphoma Patient presenting to ED via EMS with right facial droop, aphasia, dysarthria.  Last known normal 1800-day prior to admission.  Follows with oncology outpatient, Dr. Marin Olp, neurology Dr. Carles Collet and neuro oncology Dr. Mickeal Skinner.  Previously underwent 4 cycles of chemotherapy.  Seen by Dr. Mickeal Skinner in clinic on 1/18 with consideration of restarting chemotherapy, whole brain radiation and SRS.  Did recommend at that visit to resume Decadron 4 mg daily.  CT head without contrast on admission notable for increased edema in the left frontal lymphoma lesion. -- Neurology following, appreciate assistance -- MR brain with and without contrast: Pending -- EEG: Pending -- Decadron 10 mg IV x 1 today, will defer further recommendations to neurology/oncology -- PT/OT/SLP consultation -- Supportive care, antiemetics  Fever Patient with temperature 100.4 on admission.  Unclear etiology, could be secondary to increased edema from CNS lymphoma noted on CT head.  Recent diagnosis of COVID-19 viral infection 02/21/2022.  Chest x-ray with no acute  cardiopulmonary disease process.  Urinary symptoms or other localizing symptoms to suggest active infection -- Respiratory panel: Pending -- Urinalysis with urine culture pending -- Tylenol as needed for fever -- Monitor fever curve, no indication for antibiotics at this time      Advance Care Planning:   Code Status: Full Code   Consults: Neurology, Dr. Rory Percy  Family Communication: none present at bedside this am  Severity of Illness: The appropriate patient status for this patient is INPATIENT. Inpatient status is judged to be reasonable and necessary in order to provide the required intensity of service to ensure the patient's safety. The patient's presenting symptoms, physical exam findings, and initial radiographic and laboratory data in the  context of their chronic comorbidities is felt to place them at high risk for further clinical deterioration. Furthermore, it is not anticipated that the patient will be medically stable for discharge from the hospital within 2 midnights of admission.   * I certify that at the point of admission it is my clinical judgment that the patient will require inpatient hospital care spanning beyond 2 midnights from the point of admission due to high intensity of service, high risk for further deterioration and high frequency of surveillance required.*  Author: Nidia Grogan J [place] DO 03/17/2022 11:59 AM  For on call review [email or web address].

## 2022-03-17 NOTE — Code Documentation (Addendum)
Stroke Response Nurse Documentation Code Documentation  Ariel [name] is a 67 y.o. female arriving to [facility]  via [facility] on 03/17/22 with past medical hx of HLD, melanoma, brain tumor, lymphoma, pancytopenia. On No antithrombotic. Code stroke was activated by EMS.   Patient from home where she was LKW at 1800 on 1/18 and now complaining of R facial droop and aphasia. Patient was seen normal last night when she went to bed and was discovered this morning by her cousin to be aphasic with a R sided facial droop on waking.   Stroke team at the bedside on patient arrival. Labs drawn and patient cleared for CT by Dr. Ronnald Nian. Patient to CT with team. NIHSS 5, see documentation for details and code stroke times. Patient with disoriented, right facial droop, Expressive aphasia , and dysarthria  on exam. The following imaging was completed:  CT Head. Patient is not a candidate for IV Thrombolytic due to out of window. Patient is not not a candidate for IR due to no suspected LVO per MD.   Care Plan: MRI, q2hr neuro checks.   Bedside handoff with ED RN Hann[place].    Ariel [name]  Stroke Response RN

## 2022-03-17 NOTE — Procedures (Signed)
Patient Name: Ariel [name]  MRN: [id number]  Epilepsy Attending: Lora Havens  Referring Physician/Provider: Amie Portland, MD  Date: 03/17/2022 Duration: 21.08 mins  Patient history: 67yo F with aphasia. EEG to evaluate for seizure.  Level of alertness: Awake  AEDs during EEG study: None  Technical aspects: This EEG study was done with scalp electrodes positioned according to the 10-20 International system of electrode placement. Electrical activity was reviewed with band pass filter of 1-'70Hz'$ , sensitivity of 7 uV/mm, display speed of 38m/sec with a '60Hz'$  notched filter applied as appropriate. EEG data were recorded continuously and digitally stored.  Video monitoring was available and reviewed as appropriate.  Description: The posterior dominant rhythm consists of 8-9 Hz activity of moderate voltage (25-35 uV) seen predominantly in posterior head regions, symmetric and reactive to eye opening and eye closing. EEG showed intermittent 3 to 5 Hz theta-delta slowing in left frontal region.  Hyperventilation and photic stimulation were not performed.     ABNORMALITY - Intermittent slow, left frontal region  IMPRESSION: This study is suggestive of cortical dysfunction arising from left frontal region likely secondary to underlying structural abnormality. No seizures or epileptiform discharges were seen throughout the recording.  Please note lack of epileptiform activity during interictal EEG does not exclude the diagnosis of epilepsy.   [name] [name]

## 2022-03-17 NOTE — ED Notes (Signed)
ED TO INPATIENT HANDOFF REPORT  ED Nurse Name and Phone #: cori, rn 47  S Name/Age/Gender Brand Males 67 y.o. female Room/Bed: 037C/037C  Code Status   Code Status: Full Code  Home/SNF/Other Home Patient oriented to: self and situation Is this baseline? No   Triage Complete: Triage complete  Chief Complaint Aphasia [R47.01]  Triage Note Pt bib ems from home; LSN 1800 last night; woke this am with R facial droop, dysarthria, and aphasia; previous stroke October last year, no reported deficits from that stroke; cbg 121, BP 160/70, HR 109; pt not on thinners   Allergies Allergies  Allergen Reactions   Decadron [Dexamethasone] Other (See Comments)    Leg weakness Hallucinations   Plavix [Clopidogrel] Hives, Swelling and Other (See Comments)    Lip swelling    Prednisone Other (See Comments)    Steroids caused psychological issues  Hallucinations   Codeine Nausea And Vomiting    Level of Care/Admitting Diagnosis ED Disposition     ED Disposition  Admit   Condition  --   [facility] Area: [place] [[id number]]  Level of Care: Telemetry Medical [104]  May admit patient to [facility] or [facility] if equivalent level of care is available:: No  Covid Evaluation: Symptomatic Person Under Investigation (PUI) or recent exposure (last 10 days) *Testing Required*  Diagnosis: Aphasia Marvelous.[place].3.ICD-9-CM]  Admitting Physician: [place] ERIC J [[id number]]  Attending Physician: [place] ERIC J [[id number]]  Certification:: I certify this patient will need inpatient services for at least 2 midnights  Estimated Length of Stay: 3          B Medical/Surgery History Past Medical History:  Diagnosis Date   Arthritis    Chicken pox    Complication of anesthesia    Per patient, very slow to wake up after anesthesia   Goals of care, counseling/discussion 12/22/2020   High cholesterol    High grade B-cell lymphoma ([place]) 12/22/2020   Hyperglycemia  11/10/2020   Melanoma ([place]) 2021   right hip   Melanoma ([place]) 10/28/2020   pt stated brain liver and bladder   PONV (postoperative nausea and vomiting)    Stroke [facility])    Urinary incontinence    Past Surgical History:  Procedure Laterality Date   AIR/FLUID EXCHANGE Right 12/07/2020   Procedure: AIR/FLUID EXCHANGE;  Surgeon: Jalene Mullet, MD;  Location: [place];  Service: Ophthalmology;  Laterality: Right;   APPENDECTOMY  1962   BIOPSY  12/14/2020   Procedure: BIOPSY;  Surgeon: Rush Landmark Telford Nab., MD;  Location: [place];  Service: Gastroenterology;;   ESOPHAGOGASTRODUODENOSCOPY (EGD) WITH PROPOFOL N/A 12/14/2020   Procedure: ESOPHAGOGASTRODUODENOSCOPY (EGD) WITH PROPOFOL;  Surgeon: Irving Copas., MD;  Location: [place];  Service: Gastroenterology;  Laterality: N/A;   EXCISION MELANOMA WITH SENTINEL LYMPH NODE BIOPSY Right 10/09/2019   Procedure: WIDE LOCAL EXCISION WITH ADVANCEMENT FLAP CLOSURE RIGHT HIP MELANOMA WITH SENTINEL LYMPH NODE BIOPSY;  Surgeon: Stark Klein, MD;  Location: [place];  Service: General;  Laterality: Right;   FINE NEEDLE ASPIRATION  12/14/2020   Procedure: FINE NEEDLE ASPIRATION (FNA) LINEAR;  Surgeon: Irving Copas., MD;  Location: [place];  Service: Gastroenterology;;   HERNIA REPAIR  2009   IR IMAGING GUIDED PORT INSERTION  12/30/2020   MOUTH SURGERY  11/2015   gum surgery and bone implant   PARS PLANA VITRECTOMY Right 12/07/2020   Procedure: PARS PLANA VITRECTOMY WITH 25 GAUGE;  Surgeon: Jalene Mullet, MD;  Location: [place];  Service:  Ophthalmology;  Laterality: Right;   PHOTOCOAGULATION WITH LASER Right 12/07/2020   Procedure: PHOTOCOAGULATION WITH ENDOLASER PANRITINAL COAGULATION;  Surgeon: Jalene Mullet, MD;  Location: [place];  Service: Ophthalmology;  Laterality: Right;   TOTAL HIP ARTHROPLASTY Left 10/11/2021   Procedure: LEFT TOTAL HIP ARTHROPLASTY ANTERIOR APPROACH;  Surgeon: [place] Nakayama, MD;  [facility];   Service: Orthopedics;  Laterality: Left;   TOTAL HIP ARTHROPLASTY Right 01/10/2022   Procedure: RIGHT TOTAL HIP ARTHROPLASTY ANTERIOR APPROACH;  Surgeon: [place] Nakayama, MD;  Location[facility];  Service: Orthopedics;  Laterality: Right;   UPPER ESOPHAGEAL ENDOSCOPIC ULTRASOUND (EUS) N/A 12/14/2020   Procedure: UPPER ESOPHAGEAL ENDOSCOPIC ULTRASOUND (EUS);  Surgeon: Irving Copas., MD;  Location: [place];  Service: Gastroenterology;  Laterality: N/A;   WISDOM TOOTH EXTRACTION       A IV Location/Drains/Wounds Patient Lines/Drains/Airways Status     Active Line/Drains/Airways     Name Placement date Placement time Site Days   Implanted Port 12/30/20 Right Chest 12/30/20  1046  Chest  442   Peripheral IV 03/17/22 20 G Anterior;Proximal;Right Antecubital 03/17/22  --  Antecubital  less than 1   Peripheral IV 03/17/22 20 G Left Antecubital 03/17/22  1156  Antecubital  less than 1            Intake/Output Last 24 hours No intake or output data in the 24 hours ending 03/17/22 1647  Labs/Imaging Results for orders placed or performed during the hospital encounter of 03/17/22 (from the past 48 hour(s))  CBG monitoring, ED     Status: Abnormal   Collection Time: 03/17/22 10:26 AM  Result Value Ref Range   Glucose-Capillary 111 (H) 70 - 99 mg/dL    Comment: Glucose reference range applies only to samples taken after fasting for at least 8 hours.   Comment 1 Notify RN    Comment 2 Document in Chart   Protime-INR     Status: None   Collection Time: 03/17/22 10:39 AM  Result Value Ref Range   Prothrombin Time 12.2 11.4 - 15.2 seconds   INR 0.9 0.8 - 1.2    Comment: (NOTE) INR goal varies based on device and disease states. Performed at [facility], [place] [address], [place], [place] [address]   APTT     Status: None   Collection Time: 03/17/22 10:39 AM  Result Value Ref Range   aPTT 29 24 - 36 seconds    Comment: Performed at [facility]  [address] [place], [place] [address]  CBC     Status: Abnormal   Collection Time: 03/17/22 10:39 AM  Result Value Ref Range   WBC 5.5 4.0 - 10.5 K/uL   RBC 3.91 3.87 - 5.11 MIL/uL   Hemoglobin 12.9 12.0 - 15.0 g/dL   HCT 35.7 (L) 36.0 - 46.0 %   MCV 91.3 80.0 - 100.0 fL   MCH 33.0 26.0 - 34.0 pg   MCHC 36.1 (H) 30.0 - 36.0 g/dL   RDW 11.9 11.5 - 15.5 %   Platelets 146 (L) 150 - 400 K/uL    Comment: REPEATED TO VERIFY   nRBC 0.0 0.0 - 0.2 %    Comment: Performed at [facility], [place] [address] [place], [place] [address]  Differential     Status: None   Collection Time: 03/17/22 10:39 AM  Result Value Ref Range   Neutrophils Relative % 66 %   Neutro Abs 3.7 1.7 - 7.7 K/uL  Lymphocytes Relative 22 %   Lymphs Abs 1.2 0.7 - 4.0 K/uL   Monocytes Relative 11 %   Monocytes Absolute 0.6 0.1 - 1.0 K/uL   Eosinophils Relative 0 %   Eosinophils Absolute 0.0 0.0 - 0.5 K/uL   Basophils Relative 0 %   Basophils Absolute 0.0 0.0 - 0.1 K/uL   Immature Granulocytes 1 %   Abs Immature Granulocytes 0.03 0.00 - 0.07 K/uL    Comment: Performed at [facility], [place] [address] [place], [place] [address]  Comprehensive metabolic panel     Status: Abnormal   Collection Time: 03/17/22 10:39 AM  Result Value Ref Range   Sodium 130 (L) 135 - 145 mmol/L   Potassium 3.7 3.5 - 5.1 mmol/L   Chloride 93 (L) 98 - 111 mmol/L   CO2 24 22 - 32 mmol/L   Glucose, Bld 110 (H) 70 - 99 mg/dL    Comment: Glucose reference range applies only to samples taken after fasting for at least 8 hours.   BUN 10 8 - 23 mg/dL   Creatinine, Ser 0.77 0.44 - 1.00 mg/dL   Calcium 9.8 8.9 - 10.3 mg/dL   Total Protein 7.0 6.5 - 8.1 g/dL   Albumin 3.8 3.5 - 5.0 g/dL   AST 24 15 - 41 U/L   ALT 27 0 - 44 U/L   Alkaline Phosphatase 74 38 - 126 U/L   Total Bilirubin 0.5 0.3 - 1.2 mg/dL   GFR, Estimated >60 >60 mL/min    Comment: (NOTE) Calculated using the CKD-EPI Creatinine Equation (2021)    Anion gap 13 5 - 15     Comment: Performed at [facility] [address], [place], [place] [address]  Ethanol     Status: None   Collection Time: 03/17/22 10:39 AM  Result Value Ref Range   Alcohol, Ethyl (B) <10 <10 mg/dL    Comment: (NOTE) Lowest detectable limit for serum alcohol is 10 mg/dL.  For medical purposes only. Performed at [facility], [place] [address], [place],  [address]   I-stat chem 8, ED     Status: Abnormal   Collection Time: 03/17/22 10:50 AM  Result Value Ref Range   Sodium 132 (L) 135 - 145 mmol/L   Potassium 3.6 3.5 - 5.1 mmol/L   Chloride 96 (L) 98 - 111 mmol/L   BUN 12 8 - 23 mg/dL   Creatinine, Ser 0.60 0.44 - 1.00 mg/dL   Glucose, Bld 109 (H) 70 - 99 mg/dL    Comment: Glucose reference range applies only to samples taken after fasting for at least 8 hours.   Calcium, Ion 1.04 (L) 1.15 - 1.40 mmol/L   TCO2 22 22 - 32 mmol/L   Hemoglobin 11.6 (L) 12.0 - 15.0 g/dL   HCT 34.0 (L) 36.0 - 46.0 %  Resp panel by RT-PCR (RSV, Flu A&B, Covid) Anterior Nasal Swab     Status: None   Collection Time: 03/17/22 11:21 AM   Specimen: Anterior Nasal Swab  Result Value Ref Range   SARS Coronavirus 2 by RT PCR NEGATIVE NEGATIVE    Comment: (NOTE) SARS-CoV-2 target nucleic acids are NOT DETECTED.  The SARS-CoV-2 RNA is generally detectable in upper respiratory specimens during the acute phase of infection. The lowest concentration of SARS-CoV-2 viral copies this assay can detect is 138 copies/mL. A negative result does not preclude SARS-Cov-2 infection and should not be used as the sole basis for treatment or other  patient management decisions. A negative result may occur with  improper specimen collection/handling, submission of specimen other than nasopharyngeal swab, presence of viral mutation(s) within the areas targeted by this assay, and inadequate number of viral copies(<138 copies/mL). A negative result must be combined with clinical observations, patient  history, and epidemiological information. The expected result is Negative.  Fact Sheet for Patients:  EntrepreneurPulse.com.au  Fact Sheet for Healthcare Providers:  IncredibleEmployment.be  This test is no t yet approved or cleared by the [place] FDA and  has been authorized for detection and/or diagnosis of SARS-CoV-2 by FDA under an Emergency Use Authorization (EUA). This EUA will remain  in effect (meaning this test can be used) for the duration of the COVID-19 declaration under Section 564(b)(1) of the Act, 21 U.S.C.section 360bbb-3(b)(1), unless the authorization is terminated  or revoked sooner.       Influenza A by PCR NEGATIVE NEGATIVE   Influenza B by PCR NEGATIVE NEGATIVE    Comment: (NOTE) The Xpert Xpress SARS-CoV-2/FLU/RSV plus assay is intended as an aid in the diagnosis of influenza from Nasopharyngeal swab specimens and should not be used as a sole basis for treatment. Nasal washings and aspirates are unacceptable for Xpert Xpress SARS-CoV-2/FLU/RSV testing.  Fact Sheet for Patients: EntrepreneurPulse.com.au  Fact Sheet for Healthcare Providers: IncredibleEmployment.be  This test is not yet approved or cleared by the [place] FDA and has been authorized for detection and/or diagnosis of SARS-CoV-2 by FDA under an Emergency Use Authorization (EUA). This EUA will remain in effect (meaning this test can be used) for the duration of the COVID-19 declaration under Section 564(b)(1) of the Act, 21 U.S.C. section 360bbb-3(b)(1), unless the authorization is terminated or revoked.     Resp Syncytial Virus by PCR NEGATIVE NEGATIVE    Comment: (NOTE) Fact Sheet for Patients: EntrepreneurPulse.com.au  Fact Sheet for Healthcare Providers: IncredibleEmployment.be  This test is not yet approved or cleared by the [place] FDA and has been  authorized for detection and/or diagnosis of SARS-CoV-2 by FDA under an Emergency Use Authorization (EUA). This EUA will remain in effect (meaning this test can be used) for the duration of the COVID-19 declaration under Section 564(b)(1) of the Act, 21 U.S.C. section 360bbb-3(b)(1), unless the authorization is terminated or revoked.  Performed at [facility], [place] [address], [place], [place] [address]    DG Chest Portable 1 View  Result Date: 03/17/2022 CLINICAL DATA:  Fever. EXAM: PORTABLE CHEST 1 VIEW COMPARISON:  12/19/2021. FINDINGS: Unchanged right chest Port-A-Cath with tip projecting over the cavoatrial junction. Clear lungs. Normal heart size and mediastinal contours. No pleural effusion or pneumothorax. Visualized bones and upper abdomen are unremarkable. IMPRESSION: No acute cardiopulmonary disease. Electronically Signed   By: Emmit Alexanders M.D.   On: 03/17/2022 11:52   CT HEAD CODE STROKE WO CONTRAST  Result Date: 03/17/2022 CLINICAL DATA:  Code stroke.  Right-sided facial droop and aphasia EXAM: CT HEAD WITHOUT CONTRAST TECHNIQUE: Contiguous axial images were obtained from the base of the skull through the vertex without intravenous contrast. RADIATION DOSE REDUCTION: This exam was performed according to the departmental dose-optimization program which includes automated exposure control, adjustment of the mA and/or kV according to patient size and/or use of iterative reconstruction technique. COMPARISON:  02/20/2022 FINDINGS: Brain: Increased but fairly mild edema around the high-density lesion at the left frontal cortex, recurrent CNS lymphoma by most recent brain MRI. No CT correlate for enhancing disease at the left deep cerebellum. No evidence of  superimposed acute infarct, hemorrhage, or hydrocephalus. Encephalomalacia in the posterior left temporal cortex which is stable. Vascular: No hyperdense vessel or unexpected calcification. Skull: Normal. Negative for fracture or  focal lesion. Sinuses/Orbits: No acute finding. Other: Prelim sent in epic chat IMPRESSION: 1. Increased edema at the left frontal lesion. A deep left cerebellar lesion on prior brain MRI is largely occult by CT. 2. No acute hemorrhage or visible infarct. 3. Stable left temporal lobe encephalomalacia. Electronically Signed   By: Jorje Guild M.D.   On: 03/17/2022 10:44    Pending Labs Unresulted Labs (From admission, onward)     [place]     Ordered   03/17/22 1138  Urine Culture  (Urine Culture)  Once,   R       Question:  Indication  Answer:  Altered mental status (if no other cause identified)   03/17/22 1138   03/17/22 1121  Urinalysis, Routine w reflex microscopic  Once,   URGENT        03/17/22 1120   Signed and Held  HIV Antibody (routine testing w rflx)  (HIV Antibody (Routine testing w reflex) panel)  Once,   R        Signed and Held   Signed and Held  CBC  (enoxaparin (LOVENOX)    CrCl >/= 30 ml/min)  Once,   R       Comments: Baseline for enoxaparin therapy IF NOT ALREADY DRAWN.  Notify MD if PLT < 100 K.    Signed and Held   Signed and Held  Creatinine, serum  (enoxaparin (LOVENOX)    CrCl >/= 30 ml/min)  Once,   R       Comments: Baseline for enoxaparin therapy IF NOT ALREADY DRAWN.    Signed and Held   Signed and Held  Creatinine, serum  (enoxaparin (LOVENOX)    CrCl >/= 30 ml/min)  Weekly,   R     Comments: while on enoxaparin therapy    Signed and Held   Signed and Held  Basic metabolic panel  Tomorrow morning,   R        Signed and Held   Signed and Held  CBC  Tomorrow morning,   R        Signed and Held            Vitals/Pain Today's Vitals   03/17/22 1430 03/17/22 1500 03/17/22 1530 03/17/22 1545  BP: 137/62 139/63 (!) 125/59   Pulse: 98 98 84 100  Resp: (!) 23 18 (!) 25 18  Temp:      TempSrc:      SpO2: 95% 96% 96% 95%  Weight:      PainSc:        Isolation Precautions No active isolations  Medications Medications  pantoprazole (PROTONIX)  injection 40 mg (40 mg Intravenous Given 03/17/22 1217)  sodium chloride flush (NS) 0.9 % injection 3 mL (3 mLs Intravenous Given by Other 03/17/22 1045)  ondansetron (ZOFRAN) injection 4 mg (4 mg Intravenous Given by Other 03/17/22 1045)  dexamethasone (DECADRON) injection 10 mg (10 mg Intravenous Given 03/17/22 1212)  prochlorperazine (COMPAZINE) injection 10 mg (10 mg Intravenous Given 03/17/22 1206)  diphenhydrAMINE (BENADRYL) injection 12.5 mg (12.5 mg Intravenous Given 03/17/22 1201)    Mobility walks     Focused Assessments Neuro Assessment Handoff:  Swallow screen pass? No    NIH Stroke Scale  Dizziness Present: No Headache Present: No Interval: Other (Comment) (q2 neuro check) Level of Consciousness (1a.)   :  Alert, keenly responsive LOC Questions (1b. )   : Answers neither question correctly LOC Commands (1c. )   : Performs both tasks correctly Best Gaze (2. )  : Normal Visual (3. )  : No visual loss Facial Palsy (4. )    : Partial paralysis  Motor Arm, Left (5a. )   : No drift Motor Arm, Right (5b. ) : No drift Motor Leg, Left (6a. )  : No drift Motor Leg, Right (6b. ) : No drift Limb Ataxia (7. ): Absent Sensory (8. )  : Normal, no sensory loss Best Language (9. )  : Mild-to-moderate aphasia Dysarthria (10. ): Mild-to-moderate dysarthria, patient slurs at least some words and, at worst, can be understood with some difficulty Extinction/Inattention (11.)   : No Abnormality Complete NIHSS TOTAL: 6 Last date known well: 03/16/22 Last time known well: 1800 Neuro Assessment:   Neuro Checks:   Initial (03/17/22 1030)  Has TPA been given? No If patient is a Neuro Trauma and patient is going to OR before floor call report to [place] nurse: [phone] or [phone]   R Recommendations: See Admitting Provider Note  Report given to:   Additional Notes: patient is going to MRI at this time. History of brain mass/lesion. At baseline the patient has some confusion  that fluctuates. Dysphagia, dysarthria, and facial droop are new symptoms.

## 2022-03-17 NOTE — ED Provider Notes (Signed)
[facility] Provider Note   CSN: [id number] Arrival date & time: 03/17/22  1024  An emergency department physician performed an initial assessment on this suspected stroke patient at 28.  History  Chief Complaint  Patient presents with   Code Stroke    Ariel [name] is a 67 y.o. female.  Patient arrives as a code stroke.  Neurology at bedside.  History provided by EMS.  Last known normal was 6 PM last night.  History of CNS lymphoma.  She is having increasing nausea and vomiting, some right-sided facial numbness and slurred speech and aphasia.  No obvious weakness elsewhere or sensation changes.  Vital signs unremarkable with EMS.  No chest pain or shortness of breath.  The history is provided by the patient and the EMS personnel.       Home Medications Prior to Admission medications   Medication Sig Start Date End Date Taking? Authorizing Provider  acetaminophen (TYLENOL) 500 MG tablet Take 500-1,000 mg by mouth every 6 (six) hours as needed (for pain.). Patient not taking: Reported on 03/16/2022    [provider]  aspirin EC 81 MG tablet Take 1 tablet (81 mg total) by mouth 2 (two) times daily after a meal. For 2 weeks then once a day for 2 weeks for dvt prevention Patient not taking: Reported on 03/10/2022 01/10/22 01/10/23  Loni Dolly, PA-C  dexamethasone (DECADRON) 4 MG tablet Take 1 tablet (4 mg total) by mouth daily. 03/16/22   Volanda Napoleon, MD  Misc Natural Products (PRO NUTRIENTS FRUIT & VEGGIE PO) Take 4 capsules by mouth in the morning. Balance of Nature Fruit and Vegetable Supplements (2) Fruit + (2) Vegetable Patient not taking: Reported on 03/06/2022    [provider]  Multiple Vitamins-Minerals (MULTIVITAMIN WITH MINERALS) tablet Take 1 tablet by mouth daily. Patient not taking: Reported on 03/10/2022 09/19/21   [name], NP  ondansetron (ZOFRAN) 8 MG tablet TAKE 1 TABLET BY MOUTH EVERY 8  HOURS AS NEEDED FOR NAUSEA OR VOMITING 03/14/22   Volanda Napoleon, MD      Allergies    Decadron [dexamethasone], Plavix [clopidogrel], Prednisone, and Codeine    Review of Systems   Review of Systems  Physical Exam Updated Vital Signs BP (!) 158/74   Pulse 96   Temp (!) 100.4 F (38 C) (Oral)   Resp 18   Wt 69 kg   SpO2 96%   BMI 23.82 kg/m  Physical Exam Vitals and nursing note reviewed.  Constitutional:      General: She is not in acute distress.    Appearance: She is well-developed. She is not ill-appearing.  HENT:     Head: Normocephalic and atraumatic.     Nose: Nose normal.     Mouth/Throat:     Mouth: Mucous membranes are moist.  Eyes:     Extraocular Movements: Extraocular movements intact.     Conjunctiva/sclera: Conjunctivae normal.     Pupils: Pupils are equal, round, and reactive to light.  Cardiovascular:     Rate and Rhythm: Normal rate and regular rhythm.     Pulses: Normal pulses.     Heart sounds: Normal heart sounds. No murmur heard. Pulmonary:     Effort: Pulmonary effort is normal. No respiratory distress.     Breath sounds: Normal breath sounds.  Abdominal:     Palpations: Abdomen is soft.     Tenderness: There is no abdominal tenderness.  Musculoskeletal:  General: No swelling.     Cervical back: Normal range of motion and neck supple.  Skin:    General: Skin is warm and dry.     Capillary Refill: Capillary refill takes less than 2 seconds.  Neurological:     Mental Status: She is alert.     Comments: She appears to have aphasia on exam, right-sided facial droop, no obvious other strength issues or sensation issues  Psychiatric:        Mood and Affect: Mood normal.     ED Results / Procedures / Treatments   Labs (all labs ordered are listed, but only abnormal results are displayed) Labs Reviewed  CBC - Abnormal; Notable for the following components:      Result Value   HCT 35.7 (*)    MCHC 36.1 (*)    Platelets 146 (*)     All other components within normal limits  I-STAT CHEM 8, ED - Abnormal; Notable for the following components:   Sodium 132 (*)    Chloride 96 (*)    Glucose, Bld 109 (*)    Calcium, Ion 1.04 (*)    Hemoglobin 11.6 (*)    HCT 34.0 (*)    All other components within normal limits  CBG MONITORING, ED - Abnormal; Notable for the following components:   Glucose-Capillary 111 (*)    All other components within normal limits  PROTIME-INR  APTT  DIFFERENTIAL  COMPREHENSIVE METABOLIC PANEL  ETHANOL    EKG EKG Interpretation  Date/Time:  Friday March 17 2022 10:49:09 EST Ventricular Rate:  101 PR Interval:  133 QRS Duration: 82 QT Interval:  325 QTC Calculation: 422 R Axis:   93 Text Interpretation: Sinus tachycardia Left atrial enlargement Right axis deviation Borderline T abnormalities, anterior leads Confirmed by Lennice Sites ([id number]) on 03/17/2022 11:10:00 AM  Radiology CT HEAD CODE STROKE WO CONTRAST  Result Date: 03/17/2022 CLINICAL DATA:  Code stroke.  Right-sided facial droop and aphasia EXAM: CT HEAD WITHOUT CONTRAST TECHNIQUE: Contiguous axial images were obtained from the base of the skull through the vertex without intravenous contrast. RADIATION DOSE REDUCTION: This exam was performed according to the departmental dose-optimization program which includes automated exposure control, adjustment of the mA and/or kV according to patient size and/or use of iterative reconstruction technique. COMPARISON:  02/20/2022 FINDINGS: Brain: Increased but fairly mild edema around the high-density lesion at the left frontal cortex, recurrent CNS lymphoma by most recent brain MRI. No CT correlate for enhancing disease at the left deep cerebellum. No evidence of superimposed acute infarct, hemorrhage, or hydrocephalus. Encephalomalacia in the posterior left temporal cortex which is stable. Vascular: No hyperdense vessel or unexpected calcification. Skull: Normal. Negative for fracture or focal  lesion. Sinuses/Orbits: No acute finding. Other: Prelim sent in epic chat IMPRESSION: 1. Increased edema at the left frontal lesion. A deep left cerebellar lesion on prior brain MRI is largely occult by CT. 2. No acute hemorrhage or visible infarct. 3. Stable left temporal lobe encephalomalacia. Electronically Signed   By: Jorje Guild M.D.   On: 03/17/2022 10:44    Procedures Procedures    Medications Ordered in ED Medications  dexamethasone (DECADRON) injection 10 mg (has no administration in time range)  sodium chloride flush (NS) 0.9 % injection 3 mL (3 mLs Intravenous Given by Other 03/17/22 1045)  ondansetron (ZOFRAN) injection 4 mg (4 mg Intravenous Given by Other 03/17/22 1045)    ED Course/ Medical Decision Making/ A&P  Medical Decision Making Amount and/or Complexity of Data Reviewed Labs: ordered. Radiology: ordered.  Risk Prescription drug management. Decision regarding hospitalization.   Ariel [name] is here as a code stroke.  Dr. Malen Gauze with neurology here upon patient arrival to ED.  Patient last known normal was last night at 6 PM.  History of CNS lymphoma.  Has been treated with steroids.  Follows with neurology oncology.  Patient with new aphasia, right-sided facial weakness.  She is aphasic on exam with some mild right-sided facial droop.  Exam otherwise unremarkable.  Went directly for head CT and was found to have what appears to be increase edema at the left frontal lesion.  A deep left cerebellar lesion on prior MRI is not seen on CT per radiology report.  Blood work was collected and per my review and interpretation is unremarkable.  No leukocytosis.  Vital signs are unremarkable.  Temperature is 100.4.  Could be secondary to tumor burden/process but will check COVID and flu test as well as chest x-ray and urinalysis.  Neurology is recommending a one-time dose of IV Decadron to treat CNS lesion.  They recommend admission to hospitalist  service.  Neurology team will follow along.  Will continue to work with Dr. Mickeal Skinner who is her primary oncologist.  At this time we will pursue MRI with and without contrast as well as EEG.  This chart was dictated using voice recognition software.  Despite best efforts to proofread,  errors can occur which can change the documentation meaning.         Final Clinical Impression(s) / ED Diagnoses Final diagnoses:  CNS lymphoma ([place])  Aphasia  Stroke-like symptoms    Rx / DC Orders ED Discharge Orders     None         Lennice Sites, DO 03/17/22 1120

## 2022-03-17 NOTE — Progress Notes (Signed)
Pt gone to MRI will try back for EEG as schedule permits.  RN will notify when they are back from MRI

## 2022-03-17 NOTE — ED Notes (Signed)
Patient feels nausea has improved. States she is able to go to MRI at this time. MRI notified.

## 2022-03-17 NOTE — Progress Notes (Signed)
Called to speak with nurse.  She advised that they were waiting for transport to come back to pick up for MRI

## 2022-03-17 NOTE — ED Notes (Signed)
Patient in MRI

## 2022-03-17 NOTE — ED Notes (Signed)
Patient transported to MRI 

## 2022-03-17 NOTE — Consult Note (Addendum)
Neurology Consultation  Reason for Consult: Code stroke-right-sided facial weakness, aphasia Referring Physician: Dr. Ronnald Nian  CC: Right-sided facial weakness, dysarthria, aphasia  History is obtained from: Chart  HPI: Ariel [name] is a 67 y.o. female who has a past medical history of CNS lymphoma, high-grade B-cell lymphoma, melanoma of the skin, hyperlipidemia, prior history of strokelike episode which was initially thought to be stroke but turned out to be CNS lymphoma, follows with Dr. Mickeal Skinner, presented to the emergency room with last known well at 1800 hrs. of 03/16/2022 with complaints of nausea, vomiting, right facial droop, difficulty with finding her words and slurred speech which are all new to her. She was in her usual state of health last evening and this morning noticed significant weakness in the right face.  She also was not able to get her words out and was also slurring her words. Code stroke was activated in the field because of concerns for LVO positive stroke presentation. Patient has a known history of CNS lymphoma as above. She was not able to provide much of reliable history.  History is obtained from chart No witnessed seizures by the EMTs  She has seen Dr. Mickeal Skinner earlier this month and follows with Dr. Carles Collet at [facility] neurology  LKW: 1800 hrs. on 03/16/2022 IV thrombolysis given?: no, CT head with increased edema in the left frontal lymphoma lesion Premorbid modified Rankin scale (mRS): 1 ROS: Unable to obtain due to altered mental status due to aphasia  Past Medical History:  Diagnosis Date   Arthritis    Chicken pox    Complication of anesthesia    Per patient, very slow to wake up after anesthesia   Goals of care, counseling/discussion 12/22/2020   High cholesterol    High grade B-cell lymphoma ([place]) 12/22/2020   Hyperglycemia 11/10/2020   Melanoma ([place]) 2021   right hip   Melanoma ([facility]) 10/28/2020   pt stated brain liver and bladder   PONV  (postoperative nausea and vomiting)    Stroke ([place])    Urinary incontinence    Family History  Problem Relation Age of Onset   Coronary artery disease Father        died at 70   COPD Father    Alcohol abuse Father    Diabetes Mellitus II Father    Coronary artery disease Sister    Cancer Sister        breast   Heart attack Brother    Hyperlipidemia Other        died 9- "natural causes"   Hyperlipidemia Other        4 siblings    Hypertension Other        3 siblings   Social History:   reports that she has never smoked. She has never used smokeless tobacco. She reports that she does not drink alcohol and does not use drugs.  Medications No current facility-administered medications for this encounter.  Current Outpatient Medications:    acetaminophen (TYLENOL) 500 MG tablet, Take 500-1,000 mg by mouth every 6 (six) hours as needed (for pain.). (Patient not taking: Reported on 03/16/2022), Disp: , Rfl:    aspirin EC 81 MG tablet, Take 1 tablet (81 mg total) by mouth 2 (two) times daily after a meal. For 2 weeks then once a day for 2 weeks for dvt prevention (Patient not taking: Reported on 03/10/2022), Disp: 45 tablet, Rfl: 0   dexamethasone (DECADRON) 4 MG tablet, Take 1 tablet (4 mg total) by mouth daily., Disp:  30 tablet, Rfl: 2   Misc Natural Products (PRO NUTRIENTS FRUIT & VEGGIE PO), Take 4 capsules by mouth in the morning. Balance of Nature Fruit and Vegetable Supplements (2) Fruit + (2) Vegetable (Patient not taking: Reported on 03/06/2022), Disp: , Rfl:    Multiple Vitamins-Minerals (MULTIVITAMIN WITH MINERALS) tablet, Take 1 tablet by mouth daily. (Patient not taking: Reported on 03/10/2022), Disp: , Rfl:    ondansetron (ZOFRAN) 8 MG tablet, TAKE 1 TABLET BY MOUTH EVERY 8 HOURS AS NEEDED FOR NAUSEA OR VOMITING, Disp: 20 tablet, Rfl: 1  Facility-Administered Medications Ordered in Other Encounters:    sodium chloride flush (NS) 0.9 % injection 10 mL, 10 mL, Intravenous, PRN,  Volanda Napoleon, MD, 10 mL at 10/04/21 0946   Exam: Current vital signs: BP (!) 142/73   Pulse (!) 101   Temp (!) 100.4 F (38 C) (Oral)   Resp 16   Wt 69 kg   SpO2 97%   BMI 23.82 kg/m  Vital signs in last 24 hours: Temp:  [97.5 F (36.4 C)-100.4 F (38 C)] 100.4 F (38 C) (01/19 1047) Pulse Rate:  [80-109] 101 (01/19 1051) Resp:  [16-20] 16 (01/19 1051) BP: (115-142)/(60-73) 142/73 (01/19 1030) SpO2:  [97 %-98 %] 97 % (01/19 1051) Weight:  [64.2 kg-69 kg] 69 kg (01/19 1000) General: Awake alert in mild distress due to nausea and vomiting HEENT: Normocephalic atraumatic Lungs: Clear Cardiovascular: Regular rhythm and rate Extremities warm well-perfused Neurological exam She is awake alert oriented to self, was able to tell me her age but could not tell me the correct month. She is moderately aphasic She has mild dysarthria Cranial nerves: Pupils equal round react light, extraocular movements intact, visual fields appear full to threat, facial examination reveals right lower facial weakness on rest and on smiling.  Auditory acuity appears intact.  Tongue and palate midline. Motor examination with no drift in any of the 4 extremities Sensation appears intact to light touch without extinction Coordination exam with no dysmetria  NIHSS 1a Level of Conscious.: 0 1b LOC Questions: 1 1c LOC Commands: 0 2 Best Gaze: 0 3 Visual: 0 4 Facial Palsy: 1 5a Motor Arm - left: 0 5b Motor Arm - Right: 0 6a Motor Leg - Left: 0 6b Motor Leg - Right: 0 7 Limb Ataxia: 0 8 Sensory: 0 9 Best Language: 2 10 Dysarthria: 1 11 Extinct. and Inatten.: 0 TOTAL: 5  Labs I have reviewed labs in epic and the results pertinent to this consultation are: CBC    Component Value Date/Time   WBC 5.5 03/17/2022 1039   RBC 3.91 03/17/2022 1039   HGB 11.6 (L) 03/17/2022 1050   HGB 12.4 03/01/2022 1517   HCT 34.0 (L) 03/17/2022 1050   PLT 146 (L) 03/17/2022 1039   PLT 166 03/01/2022 1517    MCV 91.3 03/17/2022 1039   MCH 33.0 03/17/2022 1039   MCHC 36.1 (H) 03/17/2022 1039   RDW 11.9 03/17/2022 1039   LYMPHSABS 1.2 03/17/2022 1039   MONOABS 0.6 03/17/2022 1039   EOSABS 0.0 03/17/2022 1039   BASOSABS 0.0 03/17/2022 1039    CMP     Component Value Date/Time   NA 132 (L) 03/17/2022 1050   K 3.6 03/17/2022 1050   CL 96 (L) 03/17/2022 1050   CO2 30 03/01/2022 1517   GLUCOSE 109 (H) 03/17/2022 1050   BUN 12 03/17/2022 1050   CREATININE 0.60 03/17/2022 1050   CREATININE 0.62 03/01/2022 1517   CREATININE 0.68  11/18/2012 1439   CALCIUM 10.1 03/01/2022 1517   PROT 7.2 03/01/2022 1517   ALBUMIN 4.7 03/01/2022 1517   AST 17 03/01/2022 1517   ALT 22 03/01/2022 1517   ALKPHOS 61 03/01/2022 1517   BILITOT 0.4 03/01/2022 1517   GFRNONAA >60 03/01/2022 1517   GFRNONAA >89 11/18/2012 1439   GFRAA >60 09/30/2019 1050   GFRAA >89 11/18/2012 1439    Lipid Panel     Component Value Date/Time   CHOL 134 10/07/2020 0332   TRIG 36 10/07/2020 0332   HDL 44 10/07/2020 0332   CHOLHDL 3.0 10/07/2020 0332   VLDL 7 10/07/2020 0332   LDLCALC 83 10/07/2020 0332     Imaging I have reviewed the images obtained:  CT-head reveals increasing edema of the left frontal lesion.  The deep cerebellar lesion on prior brain MRI is largely occult by CT.  No acute hemorrhage or visible infarct.  Stable left temporal lobe encephalomalacia.  Assessment: 67 year old with past history of CNS lymphoma (diffuse large B cell) presenting for sudden onset of speech changes including aphasia and dysarthria as well as right facial droop.  CT head with increased edema in the left frontal lesion. I suspect that her CNS lymphoma is the culprit for her current presentation of aphasia-likely the worsening left frontal lesion.  It would also be prudent to rule out seizures. She will need more detailed imaging with MRI of the brain with and without contrast for further workup and guidance of treatment.  She is  a patient of Dr. Mickeal Skinner, neuro-oncology and Dr. Carles Collet, outpatient neurology.  Impression:  Aphasia, right-sided weakness-likely secondary to left frontal CNS lymphoma lesion worsening edema.  Rule out underlying seizures   Recommendations: MR brain with and without contrast I would give her a one-time dose of Decadron 10 mg IV x 1.  She has side effects of hallucinations with steroids so we will hold off on ongoing doses until we get some input from neuro-oncology. Based on the MRI results, discuss with neuro-oncology on further course of action-there was prior response to 4 cycles of HD-MTX plus rituximab, and according to Dr. Mickeal Skinner it might be reasonable to resume this therapy if tolerated preferred by patient and provider-he recommended about 6 cycles with an MRI brain after cycle 3 and 6.  Other options of high-dose whole brain radiation and SRS were also entertained-will defer to neuro-oncology. Routine EEG to rule out seizures  Plan was discussed with Dr. Ronnald Nian.  -- Amie Portland, MD Neurologist [facility] Pager: [phone]

## 2022-03-17 NOTE — ED Notes (Signed)
Patient states he she is still too nauseated to do MRI at this time.

## 2022-03-17 NOTE — ED Triage Notes (Signed)
Pt bib ems from home; LSN 1800 last night; woke this am with R facial droop, dysarthria, and aphasia; previous stroke October last year, no reported deficits from that stroke; cbg 121, BP 160/70, HR 109; pt not on thinners

## 2022-03-17 NOTE — Progress Notes (Signed)
Arrived to room for EEG, PT is nausea and vomiting, PT will return to MRI, EEG tech will return as Pts schedule permits.

## 2022-03-18 ENCOUNTER — Inpatient Hospital Stay (HOSPITAL_COMMUNITY): Payer: Medicare Other

## 2022-03-18 DIAGNOSIS — C8330 Diffuse large B-cell lymphoma, unspecified site: Secondary | ICD-10-CM

## 2022-03-18 DIAGNOSIS — R2990 Unspecified symptoms and signs involving the nervous system: Secondary | ICD-10-CM

## 2022-03-18 DIAGNOSIS — C8510 Unspecified B-cell lymphoma, unspecified site: Secondary | ICD-10-CM

## 2022-03-18 DIAGNOSIS — C7931 Secondary malignant neoplasm of brain: Secondary | ICD-10-CM | POA: Diagnosis not present

## 2022-03-18 DIAGNOSIS — C8589 Other specified types of non-Hodgkin lymphoma, extranodal and solid organ sites: Secondary | ICD-10-CM | POA: Diagnosis not present

## 2022-03-18 LAB — URINALYSIS, DIPSTICK ONLY
Bilirubin Urine: NEGATIVE
Bilirubin Urine: NEGATIVE
Glucose, UA: NEGATIVE mg/dL
Glucose, UA: NEGATIVE mg/dL
Hgb urine dipstick: NEGATIVE
Hgb urine dipstick: NEGATIVE
Ketones, ur: NEGATIVE mg/dL
Ketones, ur: NEGATIVE mg/dL
Nitrite: NEGATIVE
Nitrite: NEGATIVE
Protein, ur: 30 mg/dL — AB
Protein, ur: 30 mg/dL — AB
Specific Gravity, Urine: 1.015 (ref 1.005–1.030)
Specific Gravity, Urine: 1.009 (ref 1.005–1.030)
pH: 7 (ref 5.0–8.0)
pH: 8 (ref 5.0–8.0)

## 2022-03-18 LAB — BASIC METABOLIC PANEL
Anion gap: 11 (ref 5–15)
BUN: 23 mg/dL (ref 8–23)
CO2: 22 mmol/L (ref 22–32)
Calcium: 9.1 mg/dL (ref 8.9–10.3)
Chloride: 98 mmol/L (ref 98–111)
Creatinine, Ser: 0.71 mg/dL (ref 0.44–1.00)
GFR, Estimated: >60 mL/min (ref >60)
Glucose, Bld: 106 mg/dL — ABNORMAL HIGH (ref 70–99)
Potassium: 3.7 mmol/L (ref 3.5–5.1)
Sodium: 131 mmol/L — ABNORMAL LOW (ref 135–145)

## 2022-03-18 LAB — CBC
HCT: 31.1 % — ABNORMAL LOW (ref 36.0–46.0)
Hemoglobin: 11 g/dL — ABNORMAL LOW (ref 12.0–15.0)
MCH: 32.7 pg (ref 26.0–34.0)
MCHC: 35.4 g/dL (ref 30.0–36.0)
MCV: 92.6 fL (ref 80.0–100.0)
Platelets: 112 10*3/uL — ABNORMAL LOW (ref 150–400)
RBC: 3.36 MIL/uL — ABNORMAL LOW (ref 3.87–5.11)
RDW: 12.1 % (ref 11.5–15.5)
WBC: 6.4 10*3/uL (ref 4.0–10.5)
nRBC: 0 % (ref 0.0–0.2)

## 2022-03-18 LAB — HIV ANTIBODY (ROUTINE TESTING W REFLEX): HIV Screen 4th Generation wRfx: NONREACTIVE

## 2022-03-18 MED ORDER — OXYCODONE HCL 5 MG PO TABS: 5.0000 mg | ORAL_TABLET | ORAL | Status: DC | PRN

## 2022-03-18 MED ORDER — PANTOPRAZOLE SODIUM 40 MG PO TBEC
40.0000 mg | DELAYED_RELEASE_TABLET | Freq: Every day | ORAL | Status: DC
  Administered 2022-03-19: 40 mg via ORAL
  Filled 2022-03-18: qty 1

## 2022-03-18 MED ORDER — CHLORHEXIDINE GLUCONATE CLOTH 2 % EX PADS
6.0000 | MEDICATED_PAD | Freq: Every day | CUTANEOUS | Status: DC
  Administered 2022-03-18 – 2022-04-13 (×25): 6 via TOPICAL

## 2022-03-18 MED ORDER — DEXAMETHASONE SODIUM PHOSPHATE 10 MG/ML IJ SOLN
10.0000 mg | INTRAMUSCULAR | Status: DC
  Administered 2022-03-18 – 2022-03-24 (×7): 10 mg via INTRAVENOUS
  Filled 2022-03-18 (×7): qty 1

## 2022-03-18 MED ORDER — SODIUM CHLORIDE 0.9% FLUSH
10.0000 mL | Freq: Two times a day (BID) | INTRAVENOUS | Status: DC
  Administered 2022-03-18 – 2022-03-21 (×5): 10 mL
  Administered 2022-03-21: 20 mL
  Administered 2022-03-22 – 2022-04-13 (×36): 10 mL

## 2022-03-18 MED ORDER — SODIUM BICARBONATE 8.4 % IV SOLN
INTRAVENOUS | Status: DC
  Filled 2022-03-18 (×2): qty 1000
  Filled 2022-03-18 (×4): qty 150
  Filled 2022-03-18 (×7): qty 1000
  Filled 2022-03-18: qty 150

## 2022-03-18 MED ORDER — TRAMADOL HCL 50 MG PO TABS: 50.0000 mg | ORAL_TABLET | Freq: Four times a day (QID) | ORAL | Status: DC | PRN

## 2022-03-18 MED ORDER — SODIUM CHLORIDE 0.9% FLUSH: 10.0000 mL | INTRAVENOUS | Status: DC | PRN

## 2022-03-18 NOTE — Evaluation (Signed)
Occupational Therapy Evaluation Patient Details Name: Ariel [name] MRN: [id number] DOB: [birth year] Today's Date: 03/18/2022   History of Present Illness 67 year old white female presented 03/17/22 with  right facial droop, dysarthria, aphasia. MRI showed recurrence non-Hodgkin's lymphoma in left frontal lymphoma lesion PMH-CNS lymphoma, melanoma of the skin, high-grade B-cell lymphoma   Clinical Impression   Pt presents with problem above with deficits listed below. Performing UB Adl with set-up and LB ADL with up to min A if AE not available. Pt with mild STM deficits at time of eval and with good awareness of deficits, but difficulty compensating. Will follow acutely to address, but pt reporting she has nearly 24/7 supervision and that she has handed off IADL such as medication and financial management to her son, so recommending discharge home with current supports in place and no OT follow up.      Recommendations for follow up therapy are one component of a multi-disciplinary discharge planning process, led by the attending physician.  Recommendations may be updated based on patient status, additional functional criteria and insurance authorization.   Follow Up Recommendations  No OT follow up     Assistance Recommended at Discharge Intermittent Supervision/Assistance  Patient can return home with the following A little help with bathing/dressing/bathroom;Assistance with cooking/housework;Direct supervision/assist for medications management;Direct supervision/assist for financial management;Assist for transportation;Help with stairs or ramp for entrance    Functional Status Assessment  Patient has had a recent decline in their functional status and demonstrates the ability to make significant improvements in function in a reasonable and predictable amount of time.  Equipment Recommendations  None recommended by OT    Recommendations for Other Services       Precautions /  Restrictions Precautions Precautions: Fall Restrictions Weight Bearing Restrictions: No      Mobility Bed Mobility Overal bed mobility: Modified Independent             General bed mobility comments: HOB flat and no rail    Transfers Overall transfer level: Needs assistance Equipment used: None Transfers: Sit to/from Stand, Bed to chair/wheelchair/BSC Sit to Stand: Min guard           General transfer comment: Min guard A for safety      Balance Overall balance assessment: Needs assistance Sitting-balance support: No upper extremity supported, Feet supported Sitting balance-Leahy Scale: Good     Standing balance support: No upper extremity supported Standing balance-Leahy Scale: Fair                             ADL either performed or assessed with clinical judgement   ADL Overall ADL's : Needs assistance/impaired Eating/Feeding: Set up;Sitting   Grooming: Min guard;Standing   Upper Body Bathing: Set up;Sitting   Lower Body Bathing: Min guard;Sit to/from stand   Upper Body Dressing : Set up;Sitting   Lower Body Dressing: Min guard;With adaptive equipment;Sit to/from stand Lower Body Dressing Details (indicate cue type and reason): Pt has sock aid Toilet Transfer: Min guard;Rolling walker (2 wheels)           Functional mobility during ADLs: Min guard;Rolling walker (2 wheels) General ADL Comments: Min guard A for safety     Vision Ability to See in Adequate Light: 0 Adequate Patient Visual Report: Other (comment) (Vision is "darker") Vision Assessment?: No apparent visual deficits Additional Comments: Able to track in all quads without difficulty and read.     Perception Perception Perception Tested?: No  Praxis Praxis Praxis tested?: Not tested    Pertinent Vitals/Pain Pain Assessment Pain Assessment: No/denies pain     Hand Dominance Right   Extremity/Trunk Assessment Upper Extremity Assessment Upper Extremity  Assessment: Generalized weakness   Lower Extremity Assessment Lower Extremity Assessment: Defer to PT evaluation   Cervical / Trunk Assessment Cervical / Trunk Assessment: Normal   Communication Communication Communication: No difficulties   Cognition Arousal/Alertness: Awake/alert Behavior During Therapy: WFL for tasks assessed/performed Overall Cognitive Status: No family/caregiver present to determine baseline cognitive functioning                                 General Comments: Mild memory deficits. Higher level cognition NT due to pt allows her son to perform IADL for her. Able to answer basic safety questions regarding medication and cooking. Pt aware of memory difficulty. Not interested in OP OT at this time.     General Comments       Exercises     Shoulder Instructions      Home Living Family/patient expects to be discharged to:: Private residence Living Arrangements: Alone Available Help at Discharge: Family;Personal care attendant;Available 24 hours/day (cousin and 2 sitters) Type of Home: House Home Access: Stairs to enter [employer] of Steps: 1 Entrance Stairs-Rails: Right Home Layout: One level     Bathroom Shower/Tub: Occupational psychologist: Standard Bathroom Accessibility: Yes   Home Equipment: Shower seat;Grab bars - tub/shower;Grab bars - toilet;BSC/3in1;Toilet riser;Rolling Walker (2 wheels);Cane - single point;Rollator (4 wheels)          Prior Functioning/Environment Prior Level of Function : Needs assist             Mobility Comments: has been using RW since her 2 hip surgeries ADLs Comments: sock aid for LB ADL, son assists with medication and financial management        OT Problem List: Decreased strength;Impaired balance (sitting and/or standing);Decreased activity tolerance;Decreased knowledge of use of DME or AE      OT Treatment/Interventions: Self-care/ADL training;Therapeutic  exercise;Balance training;Patient/family education;Cognitive remediation/compensation;Therapeutic activities;DME and/or AE instruction    OT Goals(Current goals can be found in the care plan section) Acute Rehab OT Goals Patient Stated Goal: get better so she can go to [place] in August as her family is expecting a baby OT Goal Formulation: With patient Time For Goal Achievement: 04/01/22 Potential to Achieve Goals: Good  OT Frequency: Min 2X/week    Co-evaluation              AM-PAC OT "6 Clicks" Daily Activity     Outcome Measure Help from another person eating meals?: None Help from another person taking care of personal grooming?: A Little Help from another person toileting, which includes using toliet, bedpan, or urinal?: A Little Help from another person bathing (including washing, rinsing, drying)?: A Little Help from another person to put on and taking off regular upper body clothing?: A Little Help from another person to put on and taking off regular lower body clothing?: A Little 6 Click Score: 19   End of Session Equipment Utilized During Treatment: Gait belt;Rolling walker (2 wheels) Nurse Communication: Mobility status  Activity Tolerance: Patient tolerated treatment well Patient left: in bed;with call bell/phone within reach;with bed alarm set  OT Visit Diagnosis: Unsteadiness on feet (R26.81);Muscle weakness (generalized) (M62.81);Other symptoms and signs involving cognitive function;Cognitive communication deficit (R41.841) Symptoms and signs involving cognitive functions:  (  lymphoma)                Time: 2370-2301 OT Time Calculation (min): 23 min Charges:  OT General Charges $OT Visit: 1 Visit OT Evaluation $OT Eval Moderate Complexity: 1 Mod  Elder Cyphers, OTR/L [facility] Acute Rehabilitation Office: [phone]   Magnus Ivan 03/18/2022, 5:30 PM

## 2022-03-18 NOTE — Progress Notes (Signed)
EEG complete - results pending 

## 2022-03-18 NOTE — Consult Note (Signed)
Ms. Cobaugh is well-known to me.  She is a very nice 67 year old white female.  She has history of high-grade non-Hodgkin's lymphoma.  She was treated with a hybrid regimen.  She had not seen as involvement when she originally presented.  She had a total of 8 cycles of treatment.  She completed treatment back in June 2023.  She had a good response with respect to the lymphoma.  She had resolution of the lymphoma in the brain.  Recently, she was found to have a recurrence in the brain.  A recent PET scan did not show any systemic recurrence.  She has been seen by Radiation Oncology.  She has been seen by Dr. Mickeal Skinner of Neuro-oncology.  She unfortunately had some neurological changes yesterday.  She was sent to the emergency room.  She had progression of disease in the left frontal operculum but actually improvement in the cerebellum.  She had this found on an MRI.  She has been on some steroids.  She actually seems to be quite good right now.  She is talking well.  She does not have any problems with word finding.  There is no weakness.  Clearly, we need to get started with chemotherapy again.  My ultimate goal is to try to get her to CAR-T therapy.  She is eating okay.  She is having no problems with dysphagia or odynophagia.  She is having no problems with bowels or bladder incontinence.  There is no visual changes.  She has had no nausea or vomiting.  She has had no bleeding.  There is been no fever.    On physical exam, her vital signs are temperature of 98.7.  Pulse 70.  Blood pressure 113/59.  Her head neck exam shows no ocular or oral lesions.  She has good extraocular muscle movement.  She has no intra oral lesions.  She has no thrush.  Her lungs are clear bilaterally.  Cardiac exam regular rate and rhythm with no murmurs.  Abdomen is soft.  She has good bowel sounds.  There is no fluid wave.  There is no palpable liver or spleen tip.  Extremity shows no clubbing, cyanosis or edema.  She  seems to have good strength in upper and lower extremities.  Neurological exam shows no focal neurological deficits.   Ms. Strahm is a very charming 67 year old white female.  She has relapse of the non-Hodgkin's lymphoma.  The relapses in the brain.  I really hate this for her.  She had a very good response to upfront therapy.  Again, we have to get started with treatment again.  I would have to try to get her over to [facility].  I would use high-dose methotrexate.  I like to use the MTV regimen.  I realize that the thiotepa might be hard to come by.  If this is, I would not be adverse to just delete this from the protocol.  Again, we really need to decrease this CNS disease.  She seems to be doing well with the steroids that she is gotten.  I think the steroids are clearly helped her.  We will continue the steroids but add a low-dose.  We need to get her urine alkalized.  I will ask pharmacy if they can adjust her IV fluids so that we can start to get her urine alkalinized.  Again, we need to get her moved over to [facility] on the 6 E. over the weekend.  Hopefully, we can  get treatment started on Monday.  I do appreciate everybody's help.  I know this is somewhat complicated.  She has a Port-A-Cath.  We REALLY need to access the Port-A-Cath.   [name], MD  Darlyn Chamber 17:14

## 2022-03-18 NOTE — Progress Notes (Signed)
Pharmacy Consult for Urine Alkalinization prior to HDMTX Therapy  Patient has history of CNS lymphoma, melanoma of the skin, high-grade B-cell lymphoma. She was admitted for stroke like symptoms in the setting of CNS lymphoma with increased edema by CT head.  Dr.  Olp consulted pharmacy to change IV fluids to alkalinize her urine prior to initiating HDMTX on Monday, 1/22 with possible transfer to [facility] over the weekend. Based on her current labs, choosing to start with D5W. Following up on Na and K levels since both have been low and supplemented with fluids.  Labs: Recent Labs    03/17/22 1039 03/17/22 1050 03/18/22 0714  CREATININE 0.77   < > 0.71  NA 130*   < > 131*  HGB 12.9   < > 11.0*  WBC 5.5  --  6.4  RBC 3.91  --  3.36*  ALBUMIN 3.8  --   --   AST 24  --   --   ALT 27  --   --    < > = values in this interval not displayed.   Platelets 112, GFR >60  Plan:  - Start Sodium Bicarbonate 150 mEq in D5W at 75 mL/hr - Monitor urine pH q6hrs after initiation until stopped - F/u Na and K level in 12 hours - CTM CMP and CBC daily - Recommend transitioning PPI to H2RA based on DDI

## 2022-03-18 NOTE — Plan of Care (Signed)
  Problem: Education: Goal: Knowledge of General Education information will improve Description: Including pain rating scale, medication(s)/side effects and non-pharmacologic comfort measures Outcome: Progressing   Problem: Health Behavior/Discharge Planning: Goal: Ability to manage health-related needs will improve Outcome: Progressing   Problem: Clinical Measurements: Goal: Ability to maintain clinical measurements within normal limits will improve Outcome: Progressing Goal: Will remain free from infection Outcome: Progressing Goal: Diagnostic test results will improve Outcome: Progressing Goal: Respiratory complications will improve Outcome: Progressing Goal: Cardiovascular complication will be avoided Outcome: Progressing   Problem: Activity: Goal: Risk for activity intolerance will decrease Outcome: Progressing   Problem: Nutrition: Goal: Adequate nutrition will be maintained Outcome: Progressing   Problem: Coping: Goal: Level of anxiety will decrease Outcome: Progressing   Problem: Elimination: Goal: Will not experience complications related to bowel motility Outcome: Progressing Goal: Will not experience complications related to urinary retention Outcome: Progressing   Problem: Pain Managment: Goal: General experience of comfort will improve Outcome: Progressing   Problem: Safety: Goal: Ability to remain free from injury will improve Outcome: Progressing   Problem: Skin Integrity: Goal: Risk for impaired skin integrity will decrease Outcome: Progressing   Problem: Education: Goal: Knowledge of disease or condition will improve Outcome: Progressing Goal: Knowledge of secondary prevention will improve (MUST DOCUMENT ALL) Outcome: Progressing Goal: Knowledge of patient specific risk factors will improve Elta [place] N/A or DELETE if not current risk factor) Outcome: Progressing   Problem: Ischemic Stroke/TIA Tissue Perfusion: Goal: Complications of ischemic  stroke/TIA will be minimized Outcome: Progressing   Problem: Coping: Goal: Will verbalize positive feelings about self Outcome: Progressing Goal: Will identify appropriate support needs Outcome: Progressing   Problem: Health Behavior/Discharge Planning: Goal: Ability to manage health-related needs will improve Outcome: Progressing Goal: Goals will be collaboratively established with patient/family Outcome: Progressing   Problem: Self-Care: Goal: Ability to participate in self-care as condition permits will improve Outcome: Progressing Goal: Verbalization of feelings and concerns over difficulty with self-care will improve Outcome: Progressing Goal: Ability to communicate needs accurately will improve Outcome: Progressing   Problem: Nutrition: Goal: Risk of aspiration will decrease Outcome: Progressing Goal: Dietary intake will improve Outcome: Progressing

## 2022-03-18 NOTE — Evaluation (Signed)
Physical Therapy Evaluation Patient Details Name: Ariel [name] MRN: [id number] DOB: Jan 30, 1956 Today's Date: 03/18/2022  History of Present Illness  67 year old white female presented 03/17/22 with  right facial droop, dysarthria, aphasia. MRI showed recurrence non-Hodgkin's lymphoma in left frontal lymphoma lesion PMH-CNS lymphoma, melanoma of the skin, high-grade B-cell lymphoma  Clinical Impression   Pt admitted secondary to problem above with deficits below. PTA patient was living alone and modified independent with use of RW. (Has been using since recent hip surgeries and due to recent flu that weakened her).  Pt currently requires minguard assist for OOB to chair. Pt requested to defer ambulation until she has her incontinence pads from home. Will continue to assess.  Anticipate patient will benefit from PT to address problems listed below.Will continue to follow acutely to maximize functional mobility independence and safety.          Recommendations for follow up therapy are one component of a multi-disciplinary discharge planning process, led by the attending physician.  Recommendations may be updated based on patient status, additional functional criteria and insurance authorization.  Follow Up Recommendations No PT follow up      Assistance Recommended at Discharge PRN  Patient can return home with the following  A little help with walking and/or transfers;Assistance with cooking/housework;Help with stairs or ramp for entrance    Equipment Recommendations None recommended by PT  Recommendations for Other Services  OT consult    Functional Status Assessment Patient has had a recent decline in their functional status and demonstrates the ability to make significant improvements in function in a reasonable and predictable amount of time.     Precautions / Restrictions Precautions Precautions: Fall      Mobility  Bed Mobility Overal bed mobility: Modified Independent              General bed mobility comments: HOB flat and no rail    Transfers Overall transfer level: Needs assistance Equipment used: None Transfers: Sit to/from Stand, Bed to chair/wheelchair/BSC Sit to Stand: Min guard   Step pivot transfers: Min guard       General transfer comment: denied dizziness; no imbalance; minguard for safety as pt reports limited activity lately due to having the flu    Ambulation/Gait               General Gait Details: deferred by pt due to not having her pads for urinary incontinence (she plans to have son bring them in)  Stairs            Wheelchair Mobility    Modified Rankin (Stroke Patients Only)       Balance Overall balance assessment: Needs assistance   Sitting balance-Leahy Scale: Good     Standing balance support: No upper extremity supported Standing balance-Leahy Scale: Fair                               Pertinent Vitals/Pain Pain Assessment Pain Assessment: No/denies pain    Home Living Family/patient expects to be discharged to:: Private residence Living Arrangements: Alone Available Help at Discharge: Family;Personal care attendant;Available 24 hours/day (cousin + 2 sitters) Type of Home: House Home Access: Stairs to enter Entrance Stairs-Rails: Right Entrance Stairs-Number of Steps: 1   Home Layout: One level Home Equipment: Shower seat;Grab bars - tub/shower;Grab bars - toilet;BSC/3in1;Toilet riser;Rolling Walker (2 wheels);Cane - single point;Rollator (4 wheels)      Prior Function Prior Level of Function :  Independent/Modified Independent;Driving             Mobility Comments: has been using RW since her 2 hip surgeries       Hand Dominance   Dominant Hand: Right    Extremity/Trunk Assessment   Upper Extremity Assessment Upper Extremity Assessment: Defer to OT evaluation    Lower Extremity Assessment Lower Extremity Assessment: Generalized weakness    Cervical /  Trunk Assessment Cervical / Trunk Assessment: Normal  Communication   Communication: No difficulties  Cognition Arousal/Alertness: Awake/alert Behavior During Therapy: WFL for tasks assessed/performed Overall Cognitive Status: Within Functional Limits for tasks assessed                                          General Comments      Exercises     Assessment/Plan    PT Assessment Patient needs continued PT services  PT Problem List Decreased strength;Decreased activity tolerance;Decreased balance;Decreased mobility;Decreased knowledge of use of DME       PT Treatment Interventions DME instruction;Gait training;Functional mobility training;Therapeutic activities;Therapeutic exercise;Balance training;Patient/family education    PT Goals (Current goals can be found in the Care Plan section)  Acute Rehab PT Goals Patient Stated Goal: return to baseline PT Goal Formulation: With patient Time For Goal Achievement: 04/01/22 Potential to Achieve Goals: Good    Frequency Min 3X/week     Co-evaluation               AM-PAC PT "6 Clicks" Mobility  Outcome Measure Help needed turning from your back to your side while in a flat bed without using bedrails?: None Help needed moving from lying on your back to sitting on the side of a flat bed without using bedrails?: None Help needed moving to and from a bed to a chair (including a wheelchair)?: A Little Help needed standing up from a chair using your arms (e.g., wheelchair or bedside chair)?: A Little Help needed to walk in hospital room?: A Little Help needed climbing 3-5 steps with a railing? : A Little 6 Click Score: 20    End of Session   Activity Tolerance: Patient tolerated treatment well Patient left: in chair;with call bell/phone within reach Nurse Communication: Mobility status PT Visit Diagnosis: Other abnormalities of gait and mobility (R26.89);Muscle weakness (generalized) (M62.81)    Time:  0102-7253 PT Time Calculation (min) (ACUTE ONLY): 15 min   Charges:   PT Evaluation $PT Eval Low Complexity: Tracy, PT Acute Rehabilitation Services  Office [phone]   Rexanne Mano 03/18/2022, 2:31 PM

## 2022-03-18 NOTE — Progress Notes (Signed)
Ariel [name]  MEQ:[id number] DOB: [birth year] DOA: 03/17/2022 PCP: [name], NP    Brief Narrative:  67 year old with a history of diffuse large cell non-Hodgkin's lymphoma with CNS involvement and melanoma of the skin who presented to the ED via EMS 03/17/2022 with acute onset of right-sided facial droop, dysarthria, and aphasia.  CT of the head revealed increased edema in the area of her left frontal lobe lesion with no acute hemorrhage or evidence of CVA.  MRI of the brain has revealed progression of lymphoma in the left frontal operculum with improvement in the cerebellum.  Consultants:  Neurology Oncology  Goals of Care:  Code Status: Full Code   DVT prophylaxis: SCDs  Interim Hx: Afebrile.  Vital signs stable.  Resting comfortably in bed.  In good spirits.  Denies any new complaints.  Is alert and oriented.  Assessment & Plan:  High-grade non-Hodgkin's lymphoma with recent recurrence in brain Followed by Dr. Burney Gauze as well as Radiation Oncology/Neuro Oncology -plan is to transfer to [facility] to initiate chemotherapy -EEG without evidence of epileptiform discharges  Aphasia with right-sided weakness Due to left frontal lobe CNS lymphoma lesion with worsening edema -continue high-dose steroids -definitive treatment via chemotherapy upon transfer to [facility]  Mild hyponatremia Likely simply due to poor intake -monitor trend -gently hydrate and monitor  Use of high-dose steroid Patient does not tolerate Decadron well due to hallucinations but the importance of this medication has been explained to her and she is willing to continue it for now -monitor serum glucose -continue prophylactic PPI  Family Communication: No family present at time of exam Disposition: Transfer to [place] at [facility] as soon as bed available to facilitate initiation of chemotherapy   Objective: Blood pressure (!) 114/57, pulse 73, temperature 98.9 F (37.2 C), temperature source Oral,  resp. rate 16, height '5\' 7"'$  (1.702 m), weight 69 kg, SpO2 97 %.  Intake/Output Summary (Last 24 hours) at 03/18/2022 2979 Last data filed at 03/18/2022 8921 Gross per 24 hour  Intake 630.3 ml  Output 300 ml  Net 330.3 ml   Filed Weights   03/17/22 1000 03/17/22 1823  Weight: 69 kg 69 kg    Examination: General: No acute respiratory distress Lungs: Clear to auscultation bilaterally without wheezes or crackles Cardiovascular: Regular rate and rhythm without murmur  Abdomen: Nontender, nondistended, soft, bowel sounds positive, no rebound Extremities: No significant cyanosis, clubbing, or edema bilateral lower extremities  CBC: Recent Labs  Lab 03/17/22 1039 03/17/22 1050 03/18/22 0714  WBC 5.5  --  6.4  NEUTROABS 3.7  --   --   HGB 12.9 11.6* 11.0*  HCT 35.7* 34.0* 31.1*  MCV 91.3  --  92.6  PLT 146*  --  194*   Basic Metabolic Panel: Recent Labs  Lab 03/17/22 1039 03/17/22 1050 03/18/22 0714  NA 130* 132* 131*  K 3.7 3.6 3.7  CL 93* 96* 98  CO2 24  --  22  GLUCOSE 110* 109* 106*  BUN '10 12 23  '$ CREATININE 0.77 0.60 0.71  CALCIUM 9.8  --  9.1   GFR: Estimated Creatinine Clearance: 66.4 mL/min (by C-G formula based on SCr of 0.71 mg/dL).   Scheduled Meds:  dexamethasone (DECADRON) injection  10 mg Intravenous Q24H   enoxaparin (LOVENOX) injection  40 mg Subcutaneous Q24H   LORazepam  1 mg Intravenous On Call   pantoprazole (PROTONIX) IV  40 mg Intravenous Daily   Continuous Infusions:  0.9 % NaCl with KCl 20  mEq / L Stopped (03/18/22 0353)     LOS: 1 day   Cherene Altes, MD [facility] Office  [phone] Pager - Text Page per Amion  If 7PM-7AM, please contact night-coverage per Amion 03/18/2022, 9:22 AM

## 2022-03-18 NOTE — Plan of Care (Signed)
Neurology plan of care  Please seel neurology consult note from yesterday for full findings and recommendations. EEG showed intermittent L frontotemporal slowing without epileptiform abnormalities. MRI brain wwo showed progression of lymphoma in the L frontal operculum with improvement in the cerebellum. She was seen by Dr. Marin Olp her oncologist this AM who recommended moving her to [facility] over the weekend to start the MTV regimen for high-dose methotrexate on Monday. Neurology will defer further management to oncology, but please reconsult if we can be of any further assistance.   Su Monks, MD [facility] [phone]  If 7pm- 7am, please page neurology on call as listed in [place].

## 2022-03-19 ENCOUNTER — Other Ambulatory Visit: Payer: Self-pay

## 2022-03-19 DIAGNOSIS — C7931 Secondary malignant neoplasm of brain: Secondary | ICD-10-CM | POA: Diagnosis not present

## 2022-03-19 DIAGNOSIS — R2990 Unspecified symptoms and signs involving the nervous system: Secondary | ICD-10-CM | POA: Diagnosis not present

## 2022-03-19 DIAGNOSIS — C8510 Unspecified B-cell lymphoma, unspecified site: Secondary | ICD-10-CM | POA: Diagnosis not present

## 2022-03-19 DIAGNOSIS — C8589 Other specified types of non-Hodgkin lymphoma, extranodal and solid organ sites: Secondary | ICD-10-CM | POA: Diagnosis not present

## 2022-03-19 LAB — CBC WITH DIFFERENTIAL/PLATELET
Abs Immature Granulocytes: 0.03 10*3/uL (ref 0.00–0.07)
Basophils Absolute: 0 10*3/uL (ref 0.0–0.1)
Basophils Relative: 0 %
Eosinophils Absolute: 0 10*3/uL (ref 0.0–0.5)
Eosinophils Relative: 0 %
HCT: 31.1 % — ABNORMAL LOW (ref 36.0–46.0)
Hemoglobin: 11 g/dL — ABNORMAL LOW (ref 12.0–15.0)
Immature Granulocytes: 1 %
Lymphocytes Relative: 13 %
Lymphs Abs: 0.8 10*3/uL (ref 0.7–4.0)
MCH: 32.5 pg (ref 26.0–34.0)
MCHC: 35.4 g/dL (ref 30.0–36.0)
MCV: 92 fL (ref 80.0–100.0)
Monocytes Absolute: 0.5 10*3/uL (ref 0.1–1.0)
Monocytes Relative: 8 %
Neutro Abs: 4.7 10*3/uL (ref 1.7–7.7)
Neutrophils Relative %: 78 %
Platelets: 111 10*3/uL — ABNORMAL LOW (ref 150–400)
RBC: 3.38 MIL/uL — ABNORMAL LOW (ref 3.87–5.11)
RDW: 11.9 % (ref 11.5–15.5)
WBC: 6 10*3/uL (ref 4.0–10.5)
nRBC: 0 % (ref 0.0–0.2)

## 2022-03-19 LAB — COMPREHENSIVE METABOLIC PANEL
ALT: 27 U/L (ref 0–44)
AST: 25 U/L (ref 15–41)
Albumin: 3 g/dL — ABNORMAL LOW (ref 3.5–5.0)
Alkaline Phosphatase: 62 U/L (ref 38–126)
Anion gap: 12 (ref 5–15)
BUN: 16 mg/dL (ref 8–23)
CO2: 27 mmol/L (ref 22–32)
Calcium: 9.2 mg/dL (ref 8.9–10.3)
Chloride: 90 mmol/L — ABNORMAL LOW (ref 98–111)
Creatinine, Ser: 0.67 mg/dL (ref 0.44–1.00)
GFR, Estimated: >60 mL/min (ref >60)
Glucose, Bld: 155 mg/dL — ABNORMAL HIGH (ref 70–99)
Potassium: 3.3 mmol/L — ABNORMAL LOW (ref 3.5–5.1)
Sodium: 129 mmol/L — ABNORMAL LOW (ref 135–145)
Total Bilirubin: 0.4 mg/dL (ref 0.3–1.2)
Total Protein: 5.6 g/dL — ABNORMAL LOW (ref 6.5–8.1)

## 2022-03-19 LAB — URINALYSIS, DIPSTICK ONLY
Bilirubin Urine: NEGATIVE
Glucose, UA: NEGATIVE mg/dL
Hgb urine dipstick: NEGATIVE
Ketones, ur: NEGATIVE mg/dL
Nitrite: NEGATIVE
Protein, ur: 100 mg/dL — AB
Specific Gravity, Urine: 1.013 (ref 1.005–1.030)
pH: 9 — ABNORMAL HIGH (ref 5.0–8.0)

## 2022-03-19 MED ORDER — POTASSIUM CHLORIDE CRYS ER 20 MEQ PO TBCR
40.0000 meq | EXTENDED_RELEASE_TABLET | Freq: Once | ORAL | Status: AC
  Administered 2022-03-19: 40 meq via ORAL
  Filled 2022-03-19: qty 2

## 2022-03-19 MED ORDER — FAMOTIDINE 20 MG PO TABS
20.0000 mg | ORAL_TABLET | Freq: Every day | ORAL | Status: DC
  Administered 2022-03-19 – 2022-04-14 (×27): 20 mg via ORAL
  Filled 2022-03-19 (×29): qty 1

## 2022-03-19 NOTE — Progress Notes (Signed)
Ariel [name]  GGE:[id number] DOB: 10-19-1955 DOA: 03/17/2022 PCP: [name], NP    Brief Narrative:  67 year old with a history of diffuse large cell non-Hodgkin's lymphoma with CNS involvement and melanoma of the skin who presented to the ED via EMS 03/17/2022 with acute onset of right-sided facial droop, dysarthria, and aphasia.  CT of the head revealed increased edema in the area of her left frontal lobe lesion with no acute hemorrhage or evidence of CVA.  MRI of the brain has revealed progression of lymphoma in the left frontal operculum with improvement in the cerebellum.  Consultants:  Neurology Oncology  Goals of Care:  Code Status: Full Code   DVT prophylaxis: SCDs  Interim Hx: No acute events recorded overnight.  Tmax of 100.9 over last 24hrs.  No other clinical symptoms to suggest acute infection.  Vital signs otherwise stable.  At the time of my visit she is warm to touch with her most recent temperature being 99.3.  She denies any localizing symptoms but states she just feels "crummy in general."  Assessment & Plan:  High-grade non-Hodgkin's lymphoma with recent recurrence in brain Followed by Dr. Burney Gauze as well as Radiation Oncology/Neuro Oncology - pending transfer to [facility] to initiate chemotherapy - EEG without evidence of epileptiform discharges   Urine alkalinization Dr. Marin Olp has asked Pharmacy to oversee alkalinization of her urine in preparation for her chemotherapy - follow lytes   FUO Perhaps a consequence of her lymphoma? - no clear localizing sx presently - urine dipstick + for leukocytes - sent urine for culture - CoViD + Flu + RSV negative at admit - CXR unrevealing at admit - withhold abx for now as there is no clear source to treat   Aphasia with right-sided weakness Due to left frontal lobe CNS lymphoma lesion with worsening edema -continue high-dose steroids -definitive treatment via chemotherapy upon transfer to [place] have  improved markedly w/ ongoing use of decadron   Mild hyponatremia Likely simply due to poor intake, but would be at risk for SIADH given clinical scenario - follow clinically for now w/ gentle ongoing hydration   Use of high-dose steroid Patient does not tolerate Decadron well due to hallucinations but the importance of this medication has been explained to her and she is willing to continue it for now - monitor serum glucose - continue prophylactic PPI  Family Communication: No family present at time of exam Disposition: Transfer to [place] at [facility] as soon as bed available to facilitate initiation of chemotherapy    Objective: Blood pressure 132/65, pulse 94, temperature (!) 100.9 F (38.3 C), temperature source Oral, resp. rate 20, height '5\' 7"'$  (1.702 m), weight 69 kg, SpO2 98 %.  Intake/Output Summary (Last 24 hours) at 03/19/2022 0940 Last data filed at 03/19/2022 0500 Gross per 24 hour  Intake --  Output 1000 ml  Net -1000 ml    Filed Weights   03/17/22 1000 03/17/22 1823  Weight: 69 kg 69 kg    Examination: General: No acute respiratory distress Lungs: Clear to auscultation bilaterally w- no crackles  Cardiovascular: Regular rate and rhythm without murmur  Abdomen: Nontender, nondistended, soft, bowel sounds positive, no rebound Extremities: No signif edema bilateral lower extremities  CBC: Recent Labs  Lab 03/17/22 1039 03/17/22 1050 03/18/22 0714 03/19/22 0338  WBC 5.5  --  6.4 6.0  NEUTROABS 3.7  --   --  4.7  HGB 12.9 11.6* 11.0* 11.0*  HCT 35.7* 34.0* 31.1* 31.1*  MCV 91.3  --  92.6 92.0  PLT 146*  --  112* 111*    Basic Metabolic Panel: Recent Labs  Lab 03/17/22 1039 03/17/22 1050 03/18/22 0714 03/19/22 0338  NA 130* 132* 131* 129*  K 3.7 3.6 3.7 3.3*  CL 93* 96* 98 90*  CO2 24  --  22 27  GLUCOSE 110* 109* 106* 155*  BUN '10 12 23 16  '$ CREATININE 0.77 0.60 0.71 0.67  CALCIUM 9.8  --  9.1 9.2    GFR: Estimated Creatinine Clearance: 66.4  mL/min (by C-G formula based on SCr of 0.67 mg/dL).   Scheduled Meds:  Chlorhexidine Gluconate Cloth  6 each Topical Daily   dexamethasone (DECADRON) injection  10 mg Intravenous Q24H   pantoprazole  40 mg Oral Daily   sodium chloride flush  10-40 mL Intracatheter Q12H   Continuous Infusions:  sodium bicarbonate 150 mEq in dextrose 5 % 1,150 mL infusion 75 mL/hr at 03/19/22 0405     LOS: 2 days   Cherene Altes, MD [facility] Office  [phone] Pager - Text Page per Shea Evans  If 7PM-7AM, please contact night-coverage per Amion 03/19/2022, 9:40 AM

## 2022-03-19 NOTE — Progress Notes (Signed)
Report given to [facility].

## 2022-03-19 NOTE — Progress Notes (Signed)
Pharmacy Consult for Urine Alkalinization prior to HDMTX Therapy  Patient has history of CNS lymphoma, melanoma of the skin, high-grade B-cell lymphoma. She was admitted for stroke like symptoms in the setting of CNS lymphoma with increased edema by CT head.  Dr.  Olp consulted pharmacy to change IV fluids to alkalinize her urine prior to initiating HDMTX on Monday, 1/22 with possible transfer to [facility] over the weekend. Based on her current labs, choosing to start with D5W. Following up on Na and K levels since both have been low and supplemented with fluids. Potassium was supplemented 1/21 and Na remains similar to past labs with a drop from 131 to 129. CBC and CMP remain stable and levels are ok otherwise.  Labs: Recent Labs    03/19/22 0338  CREATININE 0.67  NA 129*  HGB 11.0*  WBC 6.0  RBC 3.38*  ALBUMIN 3.0*  AST 25  ALT 27    Platelets 111, GFR >60  Urine pH has increased to 9. Can reduce urine pH monitoring to daily.  Plan:  - Continue Sodium Bicarbonate 150 mEq in D5W at 75 mL/hr - Monitor urine pH daily - F/u Na and K level daily - CTM CMP and CBC daily - Recommend transitioning PPI to H2RA based on DDI

## 2022-03-20 DIAGNOSIS — C7931 Secondary malignant neoplasm of brain: Secondary | ICD-10-CM | POA: Diagnosis not present

## 2022-03-20 DIAGNOSIS — E785 Hyperlipidemia, unspecified: Secondary | ICD-10-CM

## 2022-03-20 DIAGNOSIS — C8510 Unspecified B-cell lymphoma, unspecified site: Secondary | ICD-10-CM | POA: Diagnosis not present

## 2022-03-20 DIAGNOSIS — R2990 Unspecified symptoms and signs involving the nervous system: Secondary | ICD-10-CM | POA: Diagnosis not present

## 2022-03-20 DIAGNOSIS — C8330 Diffuse large B-cell lymphoma, unspecified site: Secondary | ICD-10-CM | POA: Diagnosis not present

## 2022-03-20 DIAGNOSIS — C8589 Other specified types of non-Hodgkin lymphoma, extranodal and solid organ sites: Secondary | ICD-10-CM | POA: Diagnosis not present

## 2022-03-20 DIAGNOSIS — C8331 Diffuse large B-cell lymphoma, lymph nodes of head, face, and neck: Secondary | ICD-10-CM

## 2022-03-20 LAB — CBC WITH DIFFERENTIAL/PLATELET
Abs Immature Granulocytes: 0.04 10*3/uL (ref 0.00–0.07)
Basophils Absolute: 0 10*3/uL (ref 0.0–0.1)
Basophils Relative: 0 %
Eosinophils Absolute: 0 10*3/uL (ref 0.0–0.5)
Eosinophils Relative: 0 %
HCT: 31.7 % — ABNORMAL LOW (ref 36.0–46.0)
Hemoglobin: 11.1 g/dL — ABNORMAL LOW (ref 12.0–15.0)
Immature Granulocytes: 1 %
Lymphocytes Relative: 19 %
Lymphs Abs: 0.8 10*3/uL (ref 0.7–4.0)
MCH: 32.6 pg (ref 26.0–34.0)
MCHC: 35 g/dL (ref 30.0–36.0)
MCV: 93.2 fL (ref 80.0–100.0)
Monocytes Absolute: 0.4 10*3/uL (ref 0.1–1.0)
Monocytes Relative: 9 %
Neutro Abs: 3 10*3/uL (ref 1.7–7.7)
Neutrophils Relative %: 71 %
Platelets: 100 10*3/uL — ABNORMAL LOW (ref 150–400)
RBC: 3.4 MIL/uL — ABNORMAL LOW (ref 3.87–5.11)
RDW: 12 % (ref 11.5–15.5)
WBC: 4.2 10*3/uL (ref 4.0–10.5)
nRBC: 0 % (ref 0.0–0.2)

## 2022-03-20 LAB — URINALYSIS, DIPSTICK ONLY
Bilirubin Urine: NEGATIVE
Glucose, UA: NEGATIVE mg/dL
Hgb urine dipstick: NEGATIVE
Ketones, ur: NEGATIVE mg/dL
Nitrite: NEGATIVE
Protein, ur: 100 mg/dL — AB
Specific Gravity, Urine: 1.013 (ref 1.005–1.030)
pH: 8 (ref 5.0–8.0)

## 2022-03-20 LAB — MAGNESIUM: Magnesium: 1.9 mg/dL (ref 1.7–2.4)

## 2022-03-20 LAB — BASIC METABOLIC PANEL
Anion gap: 11 (ref 5–15)
BUN: 14 mg/dL (ref 8–23)
CO2: 27 mmol/L (ref 22–32)
Calcium: 9 mg/dL (ref 8.9–10.3)
Chloride: 92 mmol/L — ABNORMAL LOW (ref 98–111)
Creatinine, Ser: 0.67 mg/dL (ref 0.44–1.00)
GFR, Estimated: >60 mL/min (ref >60)
Glucose, Bld: 171 mg/dL — ABNORMAL HIGH (ref 70–99)
Potassium: 3.1 mmol/L — ABNORMAL LOW (ref 3.5–5.1)
Sodium: 130 mmol/L — ABNORMAL LOW (ref 135–145)

## 2022-03-20 LAB — PHOSPHORUS: Phosphorus: 1.8 mg/dL — ABNORMAL LOW (ref 2.5–4.6)

## 2022-03-20 MED ORDER — BIOTENE DRY MOUTH MT LIQD
15.0000 mL | Freq: Four times a day (QID) | OROMUCOSAL | Status: DC
  Administered 2022-03-20 – 2022-04-13 (×84): 15 mL via OROMUCOSAL

## 2022-03-20 MED ORDER — SODIUM BICARBONATE/SODIUM CHLORIDE MOUTHWASH
OROMUCOSAL | Status: DC
  Administered 2022-03-31 – 2022-04-04 (×3): 1 via OROMUCOSAL
  Filled 2022-03-20 (×4): qty 1000

## 2022-03-20 MED ORDER — ENOXAPARIN SODIUM 40 MG/0.4ML IJ SOSY
40.0000 mg | PREFILLED_SYRINGE | INTRAMUSCULAR | Status: DC
  Administered 2022-03-20 – 2022-04-02 (×14): 40 mg via SUBCUTANEOUS
  Filled 2022-03-20 (×14): qty 0.4

## 2022-03-20 MED ORDER — POLYETHYLENE GLYCOL 3350 17 G PO PACK
17.0000 g | PACK | Freq: Two times a day (BID) | ORAL | Status: DC
  Administered 2022-03-20 – 2022-03-23 (×7): 17 g via ORAL
  Filled 2022-03-20 (×8): qty 1

## 2022-03-20 MED ORDER — POTASSIUM CHLORIDE CRYS ER 20 MEQ PO TBCR
40.0000 meq | EXTENDED_RELEASE_TABLET | Freq: Two times a day (BID) | ORAL | Status: AC
  Administered 2022-03-20 (×2): 40 meq via ORAL
  Filled 2022-03-20 (×2): qty 2

## 2022-03-20 MED ORDER — SENNOSIDES-DOCUSATE SODIUM 8.6-50 MG PO TABS
1.0000 | ORAL_TABLET | Freq: Two times a day (BID) | ORAL | Status: DC
  Administered 2022-03-20 – 2022-03-23 (×8): 1 via ORAL
  Filled 2022-03-20 (×8): qty 1

## 2022-03-20 MED ORDER — POTASSIUM CHLORIDE CRYS ER 20 MEQ PO TBCR
40.0000 meq | EXTENDED_RELEASE_TABLET | Freq: Once | ORAL | Status: AC
  Administered 2022-03-20: 40 meq via ORAL
  Filled 2022-03-20: qty 2

## 2022-03-20 MED ORDER — SODIUM PHOSPHATES 45 MMOLE/15ML IV SOLN
30.0000 mmol | Freq: Once | INTRAVENOUS | Status: AC
  Administered 2022-03-20: 30 mmol via INTRAVENOUS
  Filled 2022-03-20: qty 10

## 2022-03-20 MED ORDER — NAPHAZOLINE-GLYCERIN 0.012-0.25 % OP SOLN
2.0000 [drp] | Freq: Three times a day (TID) | OPHTHALMIC | Status: DC
  Administered 2022-03-20 – 2022-04-13 (×65): 2 [drp] via OPHTHALMIC
  Filled 2022-03-20 (×4): qty 15

## 2022-03-20 NOTE — Progress Notes (Signed)
Pharmacy:  Pharmacy was consulted to adjust pt's IV fluid to attain urine pH > 8 or 9 for high dose MTX administration per Dr. Marin Olp.  Pharmacy does not have a protocol in place for [place] to management pts IVF. We will sign off for this consult. Please refer to the treatment plan/careplan for high dose MTX adm. for guidance.    Dia Sitter, PharmD, BCPS 03/20/2022 11:54 AM

## 2022-03-20 NOTE — Plan of Care (Signed)
  Problem: Nutrition: Goal: Adequate nutrition will be maintained Outcome: Not Progressing   Problem: Pain Managment: Goal: General experience of comfort will improve Outcome: Not Progressing   

## 2022-03-20 NOTE — Progress Notes (Signed)
PROGRESS NOTE    Ariel [name]  AYT:[id number] DOB: 09/08/1955 DOA: 03/17/2022 PCP: [name], NP     Brief Narrative:  67 year old WF PMHx Diffuse large cell non-Hodgkin's lymphoma with CNS involvement and melanoma of the skin   Presented to the ED via EMS 03/17/2022 with acute onset of right-sided facial droop, dysarthria, and aphasia. CT of the head revealed increased edema in the area of her left frontal lobe lesion with no acute hemorrhage or evidence of CVA. MRI of the brain has revealed progression of lymphoma in the left frontal operculum with improvement in the cerebellum.    Subjective: A/O x 4, patient states that she understands the plan, Dr. Marin Olp oncology spoke with her at noon.   Assessment & Plan: Covid vaccination;   Active Problems:   Hyperlipidemia   Melanoma ([place])   High grade B-cell lymphoma (HCC)   Diffuse large B cell lymphoma (HCC)   CNS lymphoma (HCC)   Aphasia  High-grade non-Hodgkin's lymphoma with recent recurrence in brain Followed by Dr. Burney Gauze as well as Radiation Oncology/Neuro Oncology - pending transfer to [facility] to initiate chemotherapy - EEG without evidence of epileptiform discharges  -Treatment per Dr. Marin Olp oncology   Urine alkalinization -Dr. Marin Olp has asked Pharmacy to oversee alkalinization of her urine in preparation for her chemotherapy  -Per Dr. Marin Olp    FUO -Perhaps a consequence of her lymphoma? - no clear localizing sx presently - urine dipstick + for leukocytes - sent urine for culture - CoViD + Flu + RSV negative at admit - CXR unrevealing at admit  -withhold abx for now as there is no clear source to treat    Aphasia with right-sided weakness -Due to left frontal lobe CNS lymphoma lesion with worsening edema -continue high-dose steroids -definitive treatment via chemotherapy upon transfer to [facility]  -Resolved with ongoing use of Decadron  Hyponatremia -Likely simply due to poor intake -at  risk for SIADH given clinical scenario  -Sodium bicarb 75m/hr.  Per Dr. EMarin Olp  Use of high-dose steroid -Decadron 10 mg daily per oncology  Hypokalemia - Potassium goal> - 1/22 K-Dur 40 mEq x 3 doses    Hypophosphatemia - Phosphorus goal> 2.5 - 1/22 sodium phosphate 30 mmol  Hyponatremia - 1/22 see hypophosphatemia       Goals of care - 1/22 Palliative Care Consult: Patient with Diffuse large cell non-Hodgkin's lymphoma with CNS involvement and melanoma of the skin.  Progression of lymphoma in the left frontal operculum.  Evaluate for change of CODE STATUS to DNR, palliative care vs hospice   Mobility Assessment (last 72 hours)     Mobility Assessment     Row Name 03/19/22 2027 03/19/22 1700 03/19/22 0819 03/18/22 2000 03/18/22 1700   Does patient have an order for bedrest or is patient medically unstable No - Continue assessment No - Continue assessment No - Continue assessment No - Continue assessment --   What is the highest level of mobility based on the progressive mobility assessment? Level 5 (Walks with assist in room/hall) - Balance while stepping forward/back and can walk in room with assist - Complete Level 5 (Walks with assist in room/hall) - Balance while stepping forward/back and can walk in room with assist - Complete Level 5 (Walks with assist in room/hall) - Balance while stepping forward/back and can walk in room with assist - Complete Level 5 (Walks with assist in room/hall) - Balance while stepping forward/back and can walk in room with assist - Complete  Level 5 (Walks with assist in room/hall) - Balance while stepping forward/back and can walk in room with assist - Complete   Is the above level different from baseline mobility prior to current illness? Yes - Recommend PT order Yes - Recommend PT order -- -- --    [place] Name 03/18/22 1421 03/18/22 1223 03/18/22 0808       Does patient have an order for bedrest or is patient medically unstable -- No -  Continue assessment No - Continue assessment     What is the highest level of mobility based on the progressive mobility assessment? Level 2 ([place]) - Balance while sitting on edge of bed and cannot stand Level 6 (Walks independently in room and hall) - Balance while walking in room without assist - Complete Level 6 (Walks independently in room and hall) - Balance while walking in room without assist - Complete     Is the above level different from baseline mobility prior to current illness? -- Yes - Recommend PT order Yes - Recommend PT order                    DVT prophylaxis: Lovenox Code Status: Full Family Communication:  Status is: Inpatient    Dispo: The patient is from: Home              Anticipated d/c is to: Home              Anticipated d/c date is: > 3 days              Patient currently is not medically stable to d/c.      Consultants:  Dr. Marin Olp oncology   Procedures/Significant Events:    I have personally reviewed and interpreted all radiology studies and my findings are as above.  VENTILATOR SETTINGS:    Cultures   Antimicrobials: Anti-infectives (From admission, onward)    None         Devices    LINES / TUBES:      Continuous Infusions:  sodium bicarbonate 150 mEq in dextrose 5 % 1,150 mL infusion 75 mL/hr at 03/20/22 0247     Objective: Vitals:   03/19/22 0820 03/19/22 1215 03/19/22 2057 03/20/22 0445  BP: 132/65 (!) 111/56 118/60 133/67  Pulse: 94 87 67 65  Resp: '20 17 18 18  '$ Temp: (!) 100.9 F (38.3 C) 99.3 F (37.4 C) 98.7 F (37.1 C) 98.6 F (37 C)  TempSrc: Oral Oral Oral Oral  SpO2: 98% 96% 98% 99%  Weight:      Height:        Intake/Output Summary (Last 24 hours) at 03/20/2022 1601 Last data filed at 03/20/2022 0448 Gross per 24 hour  Intake 1933.66 ml  Output 1750 ml  Net 183.66 ml   Filed Weights   03/17/22 1000 03/17/22 1823  Weight: 69 kg 69 kg    Examination:  General: A/O x 4, No  acute respiratory distress Eyes: negative scleral hemorrhage, negative anisocoria, negative icterus ENT: Negative Runny nose, negative gingival bleeding, Neck:  Negative scars, masses, torticollis, lymphadenopathy, JVD Lungs: Clear to auscultation bilaterally without wheezes or crackles Cardiovascular: Regular rate and rhythm without murmur gallop or rub normal S1 and S2 Abdomen: negative abdominal pain, nondistended, positive soft, bowel sounds, no rebound, no ascites, no appreciable mass Extremities: No significant cyanosis, clubbing, or edema bilateral lower extremities Skin: Negative rashes, lesions, ulcers Psychiatric:  Negative depression, negative anxiety, negative fatigue, negative mania  Central nervous  system:  Cranial nerves II through XII intact, tongue/uvula midline, all extremities muscle strength 5/5, sensation intact throughout, negative dysarthria, negative expressive aphasia, negative receptive aphasia.  .     Data Reviewed: Care during the described time interval was provided by me .  I have reviewed this patient's available data, including medical history, events of note, physical examination, and all test results as part of my evaluation.  CBC: Recent Labs  Lab 03/17/22 1039 03/17/22 1050 03/18/22 0714 03/19/22 0338 03/20/22 0500  WBC 5.5  --  6.4 6.0 4.2  NEUTROABS 3.7  --   --  4.7 3.0  HGB 12.9 11.6* 11.0* 11.0* 11.1*  HCT 35.7* 34.0* 31.1* 31.1* 31.7*  MCV 91.3  --  92.6 92.0 93.2  PLT 146*  --  112* 111* 572*   Basic Metabolic Panel: Recent Labs  Lab 03/17/22 1039 03/17/22 1050 03/18/22 0714 03/19/22 0338 03/20/22 0500  NA 130* 132* 131* 129* 130*  K 3.7 3.6 3.7 3.3* 3.1*  CL 93* 96* 98 90* 92*  CO2 24  --  '22 27 27  '$ GLUCOSE 110* 109* 106* 155* 171*  BUN '10 12 23 16 14  '$ CREATININE 0.77 0.60 0.71 0.67 0.67  CALCIUM 9.8  --  9.1 9.2 9.0   GFR: Estimated Creatinine Clearance: 66.4 mL/min (by C-G formula based on SCr of 0.67 mg/dL). Liver  Function Tests: Recent Labs  Lab 03/17/22 1039 03/19/22 0338  AST 24 25  ALT 27 27  ALKPHOS 74 62  BILITOT 0.5 0.4  PROT 7.0 5.6*  ALBUMIN 3.8 3.0*   No results for input(s): "LIPASE", "AMYLASE" in the last 168 hours. No results for input(s): "AMMONIA" in the last 168 hours. Coagulation Profile: Recent Labs  Lab 03/17/22 1039  INR 0.9   Cardiac Enzymes: No results for input(s): "CKTOTAL", "CKMB", "CKMBINDEX", "TROPONINI" in the last 168 hours. BNP (last 3 results) No results for input(s): "PROBNP" in the last 8760 hours. HbA1C: No results for input(s): "HGBA1C" in the last 72 hours. CBG: Recent Labs  Lab 03/17/22 1026  GLUCAP 111*   Lipid Profile: No results for input(s): "CHOL", "HDL", "LDLCALC", "TRIG", "CHOLHDL", "LDLDIRECT" in the last 72 hours. Thyroid Function Tests: No results for input(s): "TSH", "T4TOTAL", "FREET4", "T3FREE", "THYROIDAB" in the last 72 hours. Anemia Panel: No results for input(s): "VITAMINB12", "FOLATE", "FERRITIN", "TIBC", "IRON", "RETICCTPCT" in the last 72 hours. Sepsis Labs: No results for input(s): "PROCALCITON", "LATICACIDVEN" in the last 168 hours.  Recent Results (from the past 240 hour(s))  Resp panel by RT-PCR (RSV, Flu A&B, Covid) Anterior Nasal Swab     Status: None   Collection Time: 03/17/22 11:21 AM   Specimen: Anterior Nasal Swab  Result Value Ref Range Status   SARS Coronavirus 2 by RT PCR NEGATIVE NEGATIVE Final    Comment: (NOTE) SARS-CoV-2 target nucleic acids are NOT DETECTED.  The SARS-CoV-2 RNA is generally detectable in upper respiratory specimens during the acute phase of infection. The lowest concentration of SARS-CoV-2 viral copies this assay can detect is 138 copies/mL. A negative result does not preclude SARS-Cov-2 infection and should not be used as the sole basis for treatment or other patient management decisions. A negative result may occur with  improper specimen collection/handling, submission of  specimen other than nasopharyngeal swab, presence of viral mutation(s) within the areas targeted by this assay, and inadequate number of viral copies(<138 copies/mL). A negative result must be combined with clinical observations, patient history, and epidemiological information. The expected result is Negative.  Fact Sheet for Patients:  EntrepreneurPulse.com.au  Fact Sheet for Healthcare Providers:  IncredibleEmployment.be  This test is no t yet approved or cleared by the [place] FDA and  has been authorized for detection and/or diagnosis of SARS-CoV-2 by FDA under an Emergency Use Authorization (EUA). This EUA will remain  in effect (meaning this test can be used) for the duration of the COVID-19 declaration under Section 564(b)(1) of the Act, 21 U.S.C.section 360bbb-3(b)(1), unless the authorization is terminated  or revoked sooner.       Influenza A by PCR NEGATIVE NEGATIVE Final   Influenza B by PCR NEGATIVE NEGATIVE Final    Comment: (NOTE) The Xpert Xpress SARS-CoV-2/FLU/RSV plus assay is intended as an aid in the diagnosis of influenza from Nasopharyngeal swab specimens and should not be used as a sole basis for treatment. Nasal washings and aspirates are unacceptable for Xpert Xpress SARS-CoV-2/FLU/RSV testing.  Fact Sheet for Patients: EntrepreneurPulse.com.au  Fact Sheet for Healthcare Providers: IncredibleEmployment.be  This test is not yet approved or cleared by the [place] FDA and has been authorized for detection and/or diagnosis of SARS-CoV-2 by FDA under an Emergency Use Authorization (EUA). This EUA will remain in effect (meaning this test can be used) for the duration of the COVID-19 declaration under Section 564(b)(1) of the Act, 21 U.S.C. section 360bbb-3(b)(1), unless the authorization is terminated or revoked.     Resp Syncytial Virus by PCR NEGATIVE NEGATIVE Final     Comment: (NOTE) Fact Sheet for Patients: EntrepreneurPulse.com.au  Fact Sheet for Healthcare Providers: IncredibleEmployment.be  This test is not yet approved or cleared by the [place] FDA and has been authorized for detection and/or diagnosis of SARS-CoV-2 by FDA under an Emergency Use Authorization (EUA). This EUA will remain in effect (meaning this test can be used) for the duration of the COVID-19 declaration under Section 564(b)(1) of the Act, 21 U.S.C. section 360bbb-3(b)(1), unless the authorization is terminated or revoked.  Performed at [facility], [place] [address], [place], [place] [address]          Radiology Studies: No results found.      Scheduled Meds:  antiseptic oral rinse  15 mL Mouth Rinse Q6H   Chlorhexidine Gluconate Cloth  6 each Topical Daily   dexamethasone (DECADRON) injection  10 mg Intravenous Q24H   famotidine  20 mg Oral Daily   naphazoline-glycerin  2 drop Both Eyes Q8H   polyethylene glycol  17 g Oral BID   senna-docusate  1 tablet Oral BID   sodium bicarbonate/sodium chloride   Mouth Rinse Q4H   sodium chloride flush  10-40 mL Intracatheter Q12H   Continuous Infusions:  sodium bicarbonate 150 mEq in dextrose 5 % 1,150 mL infusion 75 mL/hr at 03/20/22 0247     LOS: 3 days    Time spent:40 min    Linah Klapper, Geraldo Docker, MD [facility]   If 7PM-7AM, please contact night-coverage 03/20/2022, 8:22 AM

## 2022-03-20 NOTE — Progress Notes (Signed)
Ariel [name] is now over at [facility].  I am so grateful for her being able to come over to [facility].  Hopefully, she will to start treatment today for the recurrent lymphoma into the CNS.  She seems to be quite alert and oriented.  She says she was out of bed a little bit over the weekend.  She is eating okay.  Her appetite still may not be all that great.  She has not complained of any pain.  There is no weakness.  Her urinalysis does seem to show some leukocytes.  We will have to get her a urine culture.  Her labs show white count 4.2.  Hemoglobin 9.1.  Platelet count 100,000.  The sodium 130.  Potassium 3.1.  BUN 14 creatinine 0.67.  Blood sugar is 171.  She has had no fever.  She has had no bleeding.  She has little bit of constipation.  I will make sure she gets twice a day MiraLAX along with a stool softener.  She does need to have a mouth rinse.  She needs Biotene and also sodium bicarb mouth rinse.  Hopefully, we will be able to start treatment today.  She will get Rituxan.  I think the methotrexate probably would not start until tomorrow or Wednesday.  Her vital signs show temperature of 98.6.  Pulse 65.  Blood pressure 133/67.  Her head and neck exam shows no ocular or oral lesions.  She has no adenopathy in the neck.  Lungs are clear bilaterally.  She has good air movement bilaterally.  Cardiac exam regular rate and rhythm.  She has no murmurs, rubs or bruits.  Abdomen is soft.  Bowel sounds are present.  There is no guarding or rebound tenderness.  Extremity shows no clubbing, cyanosis or edema.  She has good range of motion of her joints.  She has good strength bilaterally.  Neurological exam shows no focal neurological deficits.  [name] has a relapse of the lymphoma.  She will start treatment, hopefully, today.  Think an issue is going to be discharged for her.  I am not sure she is can be able to be home by herself.  I am sure that her family would be worried about that.   I know that she was in assisted living for quite a while when she first had her lymphoma and started her therapy.  This may not be a bad idea for her.  I have not talked to her about this.  Again, I am very grateful for all the great care that she we will get from everybody upon [address].   [name], MD  Azzie Glatter

## 2022-03-21 DIAGNOSIS — C8331 Diffuse large B-cell lymphoma, lymph nodes of head, face, and neck: Secondary | ICD-10-CM | POA: Diagnosis not present

## 2022-03-21 DIAGNOSIS — R531 Weakness: Secondary | ICD-10-CM

## 2022-03-21 DIAGNOSIS — C8330 Diffuse large B-cell lymphoma, unspecified site: Secondary | ICD-10-CM | POA: Diagnosis not present

## 2022-03-21 DIAGNOSIS — Z7189 Other specified counseling: Secondary | ICD-10-CM

## 2022-03-21 DIAGNOSIS — C8510 Unspecified B-cell lymphoma, unspecified site: Secondary | ICD-10-CM | POA: Diagnosis not present

## 2022-03-21 DIAGNOSIS — C8589 Other specified types of non-Hodgkin lymphoma, extranodal and solid organ sites: Secondary | ICD-10-CM | POA: Diagnosis not present

## 2022-03-21 DIAGNOSIS — Z515 Encounter for palliative care: Secondary | ICD-10-CM

## 2022-03-21 DIAGNOSIS — C8332 Diffuse large B-cell lymphoma, intrathoracic lymph nodes: Secondary | ICD-10-CM | POA: Diagnosis not present

## 2022-03-21 LAB — COMPREHENSIVE METABOLIC PANEL
ALT: 49 U/L — ABNORMAL HIGH (ref 0–44)
AST: 34 U/L (ref 15–41)
Albumin: 3.3 g/dL — ABNORMAL LOW (ref 3.5–5.0)
Alkaline Phosphatase: 54 U/L (ref 38–126)
Anion gap: 12 (ref 5–15)
BUN: 12 mg/dL (ref 8–23)
CO2: 29 mmol/L (ref 22–32)
Calcium: 9 mg/dL (ref 8.9–10.3)
Chloride: 88 mmol/L — ABNORMAL LOW (ref 98–111)
Creatinine, Ser: 0.56 mg/dL (ref 0.44–1.00)
GFR, Estimated: >60 mL/min (ref >60)
Glucose, Bld: 133 mg/dL — ABNORMAL HIGH (ref 70–99)
Potassium: 3.4 mmol/L — ABNORMAL LOW (ref 3.5–5.1)
Sodium: 129 mmol/L — ABNORMAL LOW (ref 135–145)
Total Bilirubin: 0.3 mg/dL (ref 0.3–1.2)
Total Protein: 6.1 g/dL — ABNORMAL LOW (ref 6.5–8.1)

## 2022-03-21 LAB — CBC WITH DIFFERENTIAL/PLATELET
Abs Immature Granulocytes: 0.05 10*3/uL (ref 0.00–0.07)
Basophils Absolute: 0 10*3/uL (ref 0.0–0.1)
Basophils Relative: 0 %
Eosinophils Absolute: 0 10*3/uL (ref 0.0–0.5)
Eosinophils Relative: 0 %
HCT: 31.7 % — ABNORMAL LOW (ref 36.0–46.0)
Hemoglobin: 11.3 g/dL — ABNORMAL LOW (ref 12.0–15.0)
Immature Granulocytes: 1 %
Lymphocytes Relative: 15 %
Lymphs Abs: 1 10*3/uL (ref 0.7–4.0)
MCH: 32.9 pg (ref 26.0–34.0)
MCHC: 35.6 g/dL (ref 30.0–36.0)
MCV: 92.4 fL (ref 80.0–100.0)
Monocytes Absolute: 0.5 10*3/uL (ref 0.1–1.0)
Monocytes Relative: 8 %
Neutro Abs: 5.3 10*3/uL (ref 1.7–7.7)
Neutrophils Relative %: 76 %
Platelets: 119 10*3/uL — ABNORMAL LOW (ref 150–400)
RBC: 3.43 MIL/uL — ABNORMAL LOW (ref 3.87–5.11)
RDW: 12.1 % (ref 11.5–15.5)
WBC: 6.9 10*3/uL (ref 4.0–10.5)
nRBC: 0 % (ref 0.0–0.2)

## 2022-03-21 LAB — MAGNESIUM: Magnesium: 2.2 mg/dL (ref 1.7–2.4)

## 2022-03-21 LAB — URINALYSIS, COMPLETE (UACMP) WITH MICROSCOPIC
Bilirubin Urine: NEGATIVE
Glucose, UA: NEGATIVE mg/dL
Hgb urine dipstick: NEGATIVE
Ketones, ur: NEGATIVE mg/dL
Nitrite: NEGATIVE
Protein, ur: 100 mg/dL — AB
Specific Gravity, Urine: 1.01 (ref 1.005–1.030)
WBC, UA: >50 WBC/hpf — ABNORMAL HIGH (ref 0–5)
pH: 9 — ABNORMAL HIGH (ref 5.0–8.0)

## 2022-03-21 LAB — URINALYSIS, DIPSTICK ONLY
Bilirubin Urine: NEGATIVE
Glucose, UA: NEGATIVE mg/dL
Hgb urine dipstick: NEGATIVE
Ketones, ur: NEGATIVE mg/dL
Nitrite: NEGATIVE
Protein, ur: 100 mg/dL — AB
Specific Gravity, Urine: 1.01 (ref 1.005–1.030)
pH: 9 — ABNORMAL HIGH (ref 5.0–8.0)

## 2022-03-21 LAB — PHOSPHORUS: Phosphorus: 3.7 mg/dL (ref 2.5–4.6)

## 2022-03-21 MED ORDER — ACETAMINOPHEN 325 MG PO TABS
650.0000 mg | ORAL_TABLET | Freq: Once | ORAL | Status: AC
  Administered 2022-03-21: 650 mg via ORAL
  Filled 2022-03-21: qty 2

## 2022-03-21 MED ORDER — DIPHENHYDRAMINE HCL 50 MG/ML IJ SOLN
25.0000 mg | Freq: Once | INTRAMUSCULAR | Status: AC
  Administered 2022-03-21: 25 mg via INTRAVENOUS
  Filled 2022-03-21: qty 1

## 2022-03-21 MED ORDER — SODIUM CHLORIDE 0.9 % IV SOLN
500.0000 mg/m2 | Freq: Once | INTRAVENOUS | Status: AC
  Administered 2022-03-21: 900 mg via INTRAVENOUS
  Filled 2022-03-21: qty 90

## 2022-03-21 MED ORDER — POTASSIUM CHLORIDE 10 MEQ/100ML IV SOLN
10.0000 meq | INTRAVENOUS | Status: AC
  Administered 2022-03-21 – 2022-03-22 (×6): 10 meq via INTRAVENOUS
  Filled 2022-03-21 (×6): qty 100

## 2022-03-21 MED ORDER — SODIUM CHLORIDE 0.9 % IV SOLN: 500.0000 mg/m2 | Freq: Once | INTRAVENOUS | Status: DC

## 2022-03-21 NOTE — Progress Notes (Signed)
OT Cancellation Note  Patient Details Name: Ariel [name] MRN: [id number] DOB: 10-11-1955   Cancelled Treatment:    Reason Eval/Treat Not Completed: Other (comment). Patient reporting feelings of malaise due to taking a new medication. She asked for therapy to hold today and check back tomorrow.    Leota Sauers, OTR/L 03/21/2022, 11:38 AM

## 2022-03-21 NOTE — Progress Notes (Signed)
Ariel [name] is doing okay.  She really had no problems yesterday.  We will start her Rituxan today.  She has not yet had a bowel movement.  She thinks she will have 1 today.  She has had no headache.  She has had no nausea or vomiting.  Her appetite might be down a little bit.  She has had no mouth sores.  She has had no fever.  There is been no rashes.  Still awaiting the urine culture result.  We will see if there is a urinary tract infection.  She has does have white cells in the urine.  Electrolytes show sodium 129.  Potassium 3.4.  Chloride is 88.  She may have some hypovolemia.  She is on some IV fluid right now.  Her BUN is 12 creatinine 0.56.  Her bilirubin is 0.3.  The white cell count is 6.9.  Hemoglobin 11.3.  Platelet count 119,000.  Her vital signs are temperature 99.  Pulse 78.  Blood pressure 130/61.  Oxygen saturation is 97%.  Her head neck exam shows no ocular or oral lesions.  She has no mucositis.  There is no adenopathy in the neck.  Lungs are clear bilaterally.  Cardiac exam regular rate and rhythm.  She has no murmurs.  Abdomen is soft.  She has good bowel sounds.  There is no guarding or rebound tenderness.  Extremity shows no clubbing, cyanosis or edema.  Neurological exam is nonfocal.   We will go ahead with her protocol today.  She will get Rituxan today.  She will get vincristine and hopefully thiotepa tomorrow and then were methotrexate on Thursday.  We will have to watch over the weekend at least to monitor her methotrexate levels.  She will be on leucovorin to help with toxicity.  Again, we had to see what the urine culture shows.  Hopefully this will be back today.  Even if there is a positive culture, we still go ahead with treatment.  I would just put her on antibiotics.  She will need prophylactic antibiotics anyway.  I am grateful for the great care that she is getting from all the staff up on 6 E.  [name], MD  Deuteronomy 31:6

## 2022-03-21 NOTE — Consult Note (Signed)
Consultation Note Date: 03/21/2022   Patient Name: Ariel [name]  DOB: 1955-12-11  MRN: [id number]  Age / Sex: 67 y.o., female  PCP: [name], NP Referring Physician: Allie Bossier, MD  Reason for Consultation: Establishing goals of care  HPI/Patient Profile: 67 y.o. 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  Being followed closely by Dr. Marin Olp, started on Rituxan, also to be started on vincristine and methotrexate later on this week.  Also possibly with urine infection and started on antibiotics.   Medicine team consulted for additional support and ongoing goals of care discussions. Patient is awake alert sitting up by the side of her bed, in good spirits, tolerating liquid diet. Palliative medicine is specialized medical care for people living with serious illness. It focuses on providing relief from the symptoms and stress of a serious illness. The goal is to improve quality of life for both the patient and the family. Goals of care: Broad aims of medical therapy in relation to the patient's values and preferences. Our aim is to provide medical care aimed at enabling patients to achieve the goals that matter most to them, given the circumstances of their particular medical situation and their constraints.    HCPOA  Son Mali Brothers [phone] is HCPOA agent.   GOC discussion/SUMMARY OF RECOMMENDATIONS   1.  Advance care planning: Patient states that she has completed advance care planning documents and the prepared living will.  She has elected her son Mali to be her  designated healthcare power of attorney agent. 2.  Physical and psychological assessment and management: Patient denies any acute pain or uncontrolled symptoms.  She had nausea earlier in the day that has since resolved.  She has not had a bowel movement yet but is awaiting having a bowel movement later on today.  Denies any uncontrolled symptoms. 3.  Spiritual and cultural assessment and management: Patient states that she is in good spirits, is appreciative of all the support and care she is getting in the hospital, she states that she knows that the treatment will " hit me hard, but I want it, I want to get better."  Active listening, supportive empathic presence and other therapeutic communication techniques employed at the time of this initial encounter. Full code full scope for now, continue current scope of care. Thank you for the consult  Code Status/Advance Care Planning: Full code   Symptom Management:     Palliative Prophylaxis:  Bowel Regimen  Additional Recommendations (Limitations, Scope, Preferences): Full Scope Treatment  Psycho-social/Spiritual:  Desire for further Chaplaincy support:yes Additional Recommendations: Caregiving  Support/Resources  Prognosis:  Unable to determine  Discharge Planning: To Be Determined      Primary Diagnoses: Present  on Admission:  Aphasia  High grade B-cell lymphoma ([place])  Hyperlipidemia  Melanoma ([place])  Diffuse large B cell lymphoma ([place])  CNS lymphoma ([place])   I have reviewed the medical record, interviewed the patient and family, and examined the patient. The following aspects are pertinent.  Past Medical History:  Diagnosis Date   Arthritis    Chicken pox    Complication of anesthesia    Per patient, very slow to wake up after anesthesia   Goals of care, counseling/discussion 12/22/2020   High cholesterol    High grade B-cell lymphoma ([place]) 12/22/2020   Hyperglycemia 11/10/2020   Melanoma ([place]) 2021   right hip    Melanoma ([place]) 10/28/2020   pt stated brain liver and bladder   PONV (postoperative nausea and vomiting)    Stroke [facility])    Urinary incontinence    Social History   Socioeconomic History   Marital status: Widowed    Spouse name: Not on file   Number of children: Not on file   Years of education: Not on file   Highest education level: Not on file  Occupational History   Occupation: retired  Tobacco Use   Smoking status: Never   Smokeless tobacco: Never  Vaping Use   Vaping Use: Never used  Substance and Sexual Activity   Alcohol use: Never   Drug use: Never   Sexual activity: Not Currently  Other Topics Concern   Not on file  Social History Narrative   05/09/2021   Presently living at [facility] in [place].   One Son- lives locally, 1 grand-daughter[place]aughter- 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   Right handed   Home one level    Caffeine 1-cup   Social Determinants of Health   Financial Resource Strain: Low Risk  (07/13/2021)   Overall Financial Resource Strain (CARDIA)    Difficulty of Paying Living Expenses: Not hard at all  Food Insecurity: No Food Insecurity (03/18/2022)   Hunger Vital Sign    Worried About Running Out of Food in the Last Year: Never true    Ran Out of Food in the Last Year: Never true  Transportation Needs: No Transportation Needs (03/18/2022)   PRAPARE - Hydrologist (Medical): No    Lack of Transportation (Non-Medical): No  Physical Activity: Insufficiently Active (07/13/2021)   Exercise Vital Sign    Days of Exercise per Week: 3 days    Minutes of Exercise per Session: 20 min  Stress: No Stress Concern Present (07/13/2021)   [place]    Feeling of Stress : Not at all  Social Connections: Socially Isolated (07/13/2021)   Social Connection and Isolation Panel [NHANES]    Frequency of Communication with Friends  and Family: More than three times a week    Frequency of Social Gatherings with Friends and Family: Once a week    Attends Religious Services: Never    Marine scientist or Organizations: No    Attends Archivist Meetings: Never    Marital Status: Widowed   Family History  Problem Relation Age of Onset   Coronary artery disease Father        died at 35   COPD Father    Alcohol abuse Father    Diabetes Mellitus II Father    Coronary artery disease Sister    Cancer Sister        breast  Heart attack Brother    Hyperlipidemia Other        died 59- "natural causes"   Hyperlipidemia Other        4 siblings    Hypertension Other        3 siblings   Scheduled Meds:  antiseptic oral rinse  15 mL Mouth Rinse Q6H   Chlorhexidine Gluconate Cloth  6 each Topical Daily   dexamethasone (DECADRON) injection  10 mg Intravenous Q24H   enoxaparin (LOVENOX) injection  40 mg Subcutaneous Q24H   famotidine  20 mg Oral Daily   naphazoline-glycerin  2 drop Both Eyes Q8H   polyethylene glycol  17 g Oral BID   senna-docusate  1 tablet Oral BID   sodium bicarbonate/sodium chloride   Mouth Rinse Q4H   sodium chloride flush  10-40 mL Intracatheter Q12H   Continuous Infusions:  potassium chloride     sodium bicarbonate 150 mEq in dextrose 5 % 1,150 mL infusion 75 mL/hr at 03/20/22 0838   PRN Meds:.acetaminophen **OR** [DISCONTINUED] acetaminophen, ondansetron **OR** ondansetron (ZOFRAN) IV, oxyCODONE, prochlorperazine, sodium chloride flush, traMADol Medications Prior to Admission:  Prior to Admission medications   Medication Sig Start Date End Date Taking? Authorizing Provider  acetaminophen (TYLENOL) 325 MG tablet Take 1,300 mg by mouth daily as needed for headache, fever, moderate pain or mild pain.   Yes [provider]  ibuprofen (ADVIL) 200 MG tablet Take 400 mg by mouth daily as needed.   Yes [provider]  ondansetron (ZOFRAN) 4 MG tablet Take 4 mg by  mouth every 6 (six) hours as needed for nausea or vomiting.   Yes [provider]  ondansetron (ZOFRAN) 8 MG tablet TAKE 1 TABLET BY MOUTH EVERY 8 HOURS AS NEEDED FOR NAUSEA OR VOMITING Patient taking differently: Take 8 mg by mouth every 8 (eight) hours as needed for nausea or vomiting. 03/14/22  Yes Volanda Napoleon, MD  pantoprazole (PROTONIX) 40 MG tablet Take 40 mg by mouth every evening.   Yes [provider]  traMADol (ULTRAM) 50 MG tablet Take 50 mg by mouth 2 (two) times daily as needed for severe pain.   Yes [provider]   Allergies  Allergen Reactions   Decadron [Dexamethasone] Other (See Comments)    Leg weakness Hallucinations   Plavix [Clopidogrel] Hives, Swelling and Other (See Comments)    Lip swelling    Prednisone Other (See Comments)    Steroids caused psychological issues  Hallucinations   Codeine Nausea And Vomiting   Review of Systems Nausea earlier that has since resolved Physical Exam Awake alert No distress Resting upright in bed No extremity weakness  Vital Signs: BP (!) 108/46 (BP Location: Left Arm)   Pulse 75   Temp 98.6 F (37 C) (Oral)   Resp 18   Ht '5\' 7"'$  (1.702 m)   Wt 69 kg   SpO2 97%   BMI 23.82 kg/m  Pain Scale: 0-10   Pain Score: 0-No pain   SpO2: SpO2: 97 % O2 Device:SpO2: 97 % O2 Flow Rate: .O2 Flow Rate (L/min): 2 L/min  IO: Intake/output summary:  Intake/Output Summary (Last 24 hours) at 03/21/2022 1637 Last data filed at 03/21/2022 1517 Gross per 24 hour  Intake 480 ml  Output 800 ml  Net -320 ml    LBM: Last BM Date : 03/20/22 Baseline Weight: Weight: 69 kg Most recent weight: Weight: 69 kg     Palliative Assessment/Data:   PPS 60%  Time In:  1540 Time Out:  1640 Time Total:  60  Greater than 50%  of this time was spent counseling and coordinating care related to the above assessment and plan.  Signed by: Loistine Chance, MD   Please contact Palliative Medicine Team phone at  [phone] for questions and concerns.  For individual provider: See Shea Evans

## 2022-03-21 NOTE — Care Management Important Message (Signed)
Important Message  Patient Details IM Letter given. Name: Ariel [name] MRN: [id number] Date of Birth: 08-03-1955   Medicare Important Message Given:  Yes     [name] 03/21/2022, 9:59 AM

## 2022-03-21 NOTE — Progress Notes (Signed)
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 CT of the head revealed increased edema in the area of her left frontal lobe lesion with no acute hemorrhage or evidence of CVA. MRI of the brain has revealed progression of lymphoma in the left frontal operculum with improvement in the cerebellum.    Subjective: A/O x 4, patient states that she understands the plan, Dr. Marin Olp oncology spoke with her at noon.   Assessment & Plan: Covid vaccination;   Active Problems:   Hyperlipidemia   Melanoma ([place])   High grade B-cell lymphoma (HCC)   Diffuse large B cell lymphoma (HCC)   CNS lymphoma (HCC)   Aphasia  High-grade non-Hodgkin's lymphoma with recent recurrence in brain Followed by Dr. Burney Gauze as well as Radiation Oncology/Neuro Oncology - pending transfer to [facility] to initiate chemotherapy - EEG without evidence of epileptiform discharges  -Treatment per Dr. Marin Olp oncology -1/23 chemo begun per oncology   Urine alkalinization -Dr. Marin Olp has asked Pharmacy to oversee alkalinization of her urine in preparation for her chemotherapy  -Per Dr. Marin Olp    FUO -Perhaps a consequence of her lymphoma? - no clear localizing sx presently - urine dipstick + for leukocytes - sent urine for culture - CoViD + Flu + RSV negative at admit - CXR unrevealing at admit  -withhold abx for now as there is no clear source to treat    Aphasia with right-sided weakness -Due to left frontal lobe CNS lymphoma lesion with worsening edema -continue high-dose steroids -definitive treatment via chemotherapy upon transfer to [facility]  -Resolved with ongoing use of Decadron  Hyponatremia -Likely  simply due to poor intake -at risk for SIADH given clinical scenario  -Sodium bicarb 81m/hr.  Per Dr. 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  Progression of lymphoma in the left frontal operculum.  Evaluate for change of CODE STATUS to DNR, palliative care vs hospice   Mobility Assessment (last 72 hours)     Mobility Assessment     Row Name 03/20/22 2019 03/19/22 2027 03/19/22 1700 03/19/22 0819 03/18/22 2000   Does patient have an order for bedrest or is patient medically unstable No - Continue assessment No - Continue assessment No - Continue assessment No - Continue assessment No - Continue assessment   What is the highest level of mobility based on the progressive mobility assessment? Level 5 (Walks with assist in room/hall) - Balance while stepping forward/back and can walk in room with assist - Complete Level 5 (Walks with assist in room/hall) - Balance while stepping forward/back and can walk in room with assist - Complete Level 5 (Walks with assist in room/hall) - Balance while stepping forward/back and can walk in room with assist - Complete Level 5 (Walks with assist in room/hall) -  Balance while stepping forward/back and can walk in room with assist - Complete Level 5 (Walks with assist in room/hall) - Balance while stepping forward/back and can walk in room with assist - Complete   Is the above level different from baseline mobility prior to current illness? Yes - Recommend PT order Yes - Recommend PT order Yes - Recommend PT order -- --    [place] Name 03/18/22 1700           What is  the highest level of mobility based on the progressive mobility assessment? Level 5 (Walks with assist in room/hall) - Balance while stepping forward/back and can walk in room with assist - Complete                      DVT prophylaxis: Lovenox Code Status: Full Family Communication:  Status is: Inpatient    Dispo: The patient is from: Home              Anticipated d/c is to: Home              Anticipated d/c date is: > 3 days              Patient currently is not medically stable to d/c.      Consultants:  Dr. Marin Olp oncology   Procedures/Significant Events:    I have personally reviewed and interpreted all radiology studies and my findings are as above.  VENTILATOR SETTINGS:    Cultures   Antimicrobials: Anti-infectives (From admission, onward)    None         Devices    LINES / TUBES:      Continuous Infusions:  sodium bicarbonate 150 mEq in dextrose 5 % 1,150 mL infusion 75 mL/hr at 03/20/22 0838     Objective: Vitals:   03/21/22 1139 03/21/22 1212 03/21/22 1242 03/21/22 1342  BP: 117/63 (!) 118/57 106/60 (!) 108/46  Pulse: 81 71 74 75  Resp: '18 18 18 18  '$ Temp: 98.5 F (36.9 C) 98.8 F (37.1 C) 98.8 F (37.1 C) 98.6 F (37 C)  TempSrc: Oral Oral Oral Oral  SpO2: 97% 98% 96% 97%  Weight:      Height:        Intake/Output Summary (Last 24 hours) at 03/21/2022 1625 Last data filed at 03/21/2022 1517 Gross per 24 hour  Intake 480 ml  Output 800 ml  Net -320 ml    Filed Weights   03/17/22 1000 03/17/22 1823  Weight: 69 kg 69 kg   Physical Exam:  General: A/O x 4, No acute respiratory distress Eyes: negative scleral hemorrhage, negative anisocoria, negative icterus ENT: Negative Runny nose, negative gingival bleeding, Neck:  Negative scars, masses, torticollis, lymphadenopathy, JVD Lungs: Clear to auscultation bilaterally without wheezes or crackles Cardiovascular: Regular rate and rhythm without murmur gallop or rub  normal S1 and S2 Abdomen: negative abdominal pain, nondistended, positive soft, bowel sounds, no rebound, no ascites, no appreciable mass Extremities: No significant cyanosis, clubbing, or edema bilateral lower extremities Skin: Negative rashes, lesions, ulcers Psychiatric:  Negative depression, negative anxiety, negative fatigue, negative mania  Central nervous system:  Cranial nerves II through XII intact, tongue/uvula midline, all extremities muscle strength 5/5, sensation intact throughout,negative dysarthria, negative expressive aphasia, negative receptive aphasia.   .     Data Reviewed: Care during the described time interval was provided by me .  I have reviewed this patient's available data, including medical history, events of note, physical examination,  and all test results as part of my evaluation.  CBC: Recent Labs  Lab 03/17/22 1039 03/17/22 1050 03/18/22 0714 03/19/22 0338 03/20/22 0500 03/21/22 0532  WBC 5.5  --  6.4 6.0 4.2 6.9  NEUTROABS 3.7  --   --  4.7 3.0 5.3  HGB 12.9 11.6* 11.0* 11.0* 11.1* 11.3*  HCT 35.7* 34.0* 31.1* 31.1* 31.7* 31.7*  MCV 91.3  --  92.6 92.0 93.2 92.4  PLT 146*  --  112* 111* 100* 119*    Basic Metabolic Panel: Recent Labs  Lab 03/17/22 1039 03/17/22 1050 03/18/22 0714 03/19/22 0338 03/20/22 0500 03/20/22 1255 03/21/22 0532  NA 130* 132* 131* 129* 130*  --  129*  K 3.7 3.6 3.7 3.3* 3.1*  --  3.4*  CL 93* 96* 98 90* 92*  --  88*  CO2 24  --  '22 27 27  '$ --  29  GLUCOSE 110* 109* 106* 155* 171*  --  133*  BUN '10 12 23 16 14  '$ --  12  CREATININE 0.77 0.60 0.71 0.67 0.67  --  0.56  CALCIUM 9.8  --  9.1 9.2 9.0  --  9.0  MG  --   --   --   --   --  1.9 2.2  PHOS  --   --   --   --   --  1.8* 3.7    GFR: Estimated Creatinine Clearance: 66.4 mL/min (by C-G formula based on SCr of 0.56 mg/dL). Liver Function Tests: Recent Labs  Lab 03/17/22 1039 03/19/22 0338 03/21/22 0532  AST 24 25 34  ALT 27 27 49*  ALKPHOS 74 62 54   BILITOT 0.5 0.4 0.3  PROT 7.0 5.6* 6.1*  ALBUMIN 3.8 3.0* 3.3*    No results for input(s): "LIPASE", "AMYLASE" in the last 168 hours. No results for input(s): "AMMONIA" in the last 168 hours. Coagulation Profile: Recent Labs  Lab 03/17/22 1039  INR 0.9    Cardiac Enzymes: No results for input(s): "CKTOTAL", "CKMB", "CKMBINDEX", "TROPONINI" in the last 168 hours. BNP (last 3 results) No results for input(s): "PROBNP" in the last 8760 hours. HbA1C: No results for input(s): "HGBA1C" in the last 72 hours. CBG: Recent Labs  Lab 03/17/22 1026  GLUCAP 111*    Lipid Profile: No results for input(s): "CHOL", "HDL", "LDLCALC", "TRIG", "CHOLHDL", "LDLDIRECT" in the last 72 hours. Thyroid Function Tests: No results for input(s): "TSH", "T4TOTAL", "FREET4", "T3FREE", "THYROIDAB" in the last 72 hours. Anemia Panel: No results for input(s): "VITAMINB12", "FOLATE", "FERRITIN", "TIBC", "IRON", "RETICCTPCT" in the last 72 hours. Sepsis Labs: No results for input(s): "PROCALCITON", "LATICACIDVEN" in the last 168 hours.  Recent Results (from the past 240 hour(s))  Resp panel by RT-PCR (RSV, Flu A&B, Covid) Anterior Nasal Swab     Status: None   Collection Time: 03/17/22 11:21 AM   Specimen: Anterior Nasal Swab  Result Value Ref Range Status   SARS Coronavirus 2 by RT PCR NEGATIVE NEGATIVE Final    Comment: (NOTE) SARS-CoV-2 target nucleic acids are NOT DETECTED.  The SARS-CoV-2 RNA is generally detectable in upper respiratory specimens during the acute phase of infection. The lowest concentration of SARS-CoV-2 viral copies this assay can detect is 138 copies/mL. A negative result does not preclude SARS-Cov-2 infection and should not be used as the sole basis for treatment or other patient management decisions. A negative result may occur with  improper specimen collection/handling, submission of specimen other than nasopharyngeal swab, presence of viral mutation(s)  within the areas  targeted by this assay, and inadequate number of viral copies(<138 copies/mL). A negative result must be combined with clinical observations, patient history, and epidemiological information. The expected result is Negative.  Fact Sheet for Patients:  EntrepreneurPulse.com.au  Fact Sheet for Healthcare Providers:  IncredibleEmployment.be  This test is no t yet approved or cleared by the [place] FDA and  has been authorized for detection and/or diagnosis of SARS-CoV-2 by FDA under an Emergency Use Authorization (EUA). This EUA will remain  in effect (meaning this test can be used) for the duration of the COVID-19 declaration under Section 564(b)(1) of the Act, 21 U.S.C.section 360bbb-3(b)(1), unless the authorization is terminated  or revoked sooner.       Influenza A by PCR NEGATIVE NEGATIVE Final   Influenza B by PCR NEGATIVE NEGATIVE Final    Comment: (NOTE) The Xpert Xpress SARS-CoV-2/FLU/RSV plus assay is intended as an aid in the diagnosis of influenza from Nasopharyngeal swab specimens and should not be used as a sole basis for treatment. Nasal washings and aspirates are unacceptable for Xpert Xpress SARS-CoV-2/FLU/RSV testing.  Fact Sheet for Patients: EntrepreneurPulse.com.au  Fact Sheet for Healthcare Providers: IncredibleEmployment.be  This test is not yet approved or cleared by the [place] FDA and has been authorized for detection and/or diagnosis of SARS-CoV-2 by FDA under an Emergency Use Authorization (EUA). This EUA will remain in effect (meaning this test can be used) for the duration of the COVID-19 declaration under Section 564(b)(1) of the Act, 21 U.S.C. section 360bbb-3(b)(1), unless the authorization is terminated or revoked.     Resp Syncytial Virus by PCR NEGATIVE NEGATIVE Final    Comment: (NOTE) Fact Sheet for  Patients: EntrepreneurPulse.com.au  Fact Sheet for Healthcare Providers: IncredibleEmployment.be  This test is not yet approved or cleared by the [place] FDA and has been authorized for detection and/or diagnosis of SARS-CoV-2 by FDA under an Emergency Use Authorization (EUA). This EUA will remain in effect (meaning this test can be used) for the duration of the COVID-19 declaration under Section 564(b)(1) of the Act, 21 U.S.C. section 360bbb-3(b)(1), unless the authorization is terminated or revoked.  Performed at [facility], [place] [address], [place], [place] [address]   Urine Culture     Status: Abnormal (Preliminary result)   Collection Time: 03/20/22  7:05 AM   Specimen: Urine, Catheterized  Result Value Ref Range Status   Specimen Description   Final    URINE, CATHETERIZED Performed at [facility] [address] [place], [place] [address]    Special Requests   Final    Immunocompromised Performed at [facility], [place] [address] [place], [place] [address]    Culture (A)  Final    >=100,000 COLONIES/mL ESCHERICHIA COLI SUSCEPTIBILITIES TO FOLLOW Performed at [facility], [place] [address] [place], [place] [address]    Report Status PENDING  Incomplete         Radiology Studies: No results found.      Scheduled Meds:  antiseptic oral rinse  15 mL Mouth Rinse Q6H   Chlorhexidine Gluconate Cloth  6 each Topical Daily   dexamethasone (DECADRON) injection  10 mg Intravenous Q24H   enoxaparin (LOVENOX) injection  40 mg Subcutaneous Q24H   famotidine  20 mg Oral Daily   naphazoline-glycerin  2 drop Both Eyes Q8H   polyethylene glycol  17 g Oral BID   senna-docusate  1 tablet Oral BID   sodium bicarbonate/sodium  chloride   Mouth Rinse Q4H   sodium chloride flush  10-40 mL Intracatheter Q12H   Continuous Infusions:  sodium bicarbonate 150 mEq in dextrose 5 % 1,150 mL  infusion 75 mL/hr at 03/20/22 0838     LOS: 4 days    Time spent:40 min    Sonny Poth, Geraldo Docker, MD [facility]   If 7PM-7AM, please contact night-coverage 03/21/2022, 4:25 PM

## 2022-03-21 NOTE — Progress Notes (Signed)
PT Cancellation Note  Patient Details Name: Ariel [name] MRN: [id number] DOB: 19-Aug-1955   Cancelled Treatment:    Reason Eval/Treat Not Completed: Other (comment) Pt reports really sick from new med and request PT hold until tomorrow.  Abran Richard, PT Acute Rehab [facility] [phone]   Karlton Lemon 03/21/2022, 11:24 AM

## 2022-03-21 NOTE — Progress Notes (Signed)
Truxima biosimilar changed to Ruxience biosimilar for rituximab at same dose.

## 2022-03-22 DIAGNOSIS — C8510 Unspecified B-cell lymphoma, unspecified site: Secondary | ICD-10-CM | POA: Diagnosis not present

## 2022-03-22 DIAGNOSIS — C8330 Diffuse large B-cell lymphoma, unspecified site: Secondary | ICD-10-CM | POA: Diagnosis not present

## 2022-03-22 LAB — CBC WITH DIFFERENTIAL/PLATELET
Abs Immature Granulocytes: 0.09 10*3/uL — ABNORMAL HIGH (ref 0.00–0.07)
Basophils Absolute: 0 10*3/uL (ref 0.0–0.1)
Basophils Relative: 0 %
Eosinophils Absolute: 0 10*3/uL (ref 0.0–0.5)
Eosinophils Relative: 0 %
HCT: 33.6 % — ABNORMAL LOW (ref 36.0–46.0)
Hemoglobin: 11.6 g/dL — ABNORMAL LOW (ref 12.0–15.0)
Immature Granulocytes: 1 %
Lymphocytes Relative: 18 %
Lymphs Abs: 1.1 10*3/uL (ref 0.7–4.0)
MCH: 32.3 pg (ref 26.0–34.0)
MCHC: 34.5 g/dL (ref 30.0–36.0)
MCV: 93.6 fL (ref 80.0–100.0)
Monocytes Absolute: 0.5 10*3/uL (ref 0.1–1.0)
Monocytes Relative: 8 %
Neutro Abs: 4.5 10*3/uL (ref 1.7–7.7)
Neutrophils Relative %: 73 %
Platelets: 111 10*3/uL — ABNORMAL LOW (ref 150–400)
RBC: 3.59 MIL/uL — ABNORMAL LOW (ref 3.87–5.11)
RDW: 12.2 % (ref 11.5–15.5)
WBC: 6.3 10*3/uL (ref 4.0–10.5)
nRBC: 0 % (ref 0.0–0.2)

## 2022-03-22 LAB — URINALYSIS, COMPLETE (UACMP) WITH MICROSCOPIC
Bilirubin Urine: NEGATIVE
Glucose, UA: NEGATIVE mg/dL
Hgb urine dipstick: NEGATIVE
Ketones, ur: NEGATIVE mg/dL
Nitrite: NEGATIVE
Protein, ur: 30 mg/dL — AB
Specific Gravity, Urine: 1.008 (ref 1.005–1.030)
WBC, UA: >50 WBC/hpf — ABNORMAL HIGH (ref 0–5)
pH: 9 — ABNORMAL HIGH (ref 5.0–8.0)

## 2022-03-22 LAB — COMPREHENSIVE METABOLIC PANEL
ALT: 54 U/L — ABNORMAL HIGH (ref 0–44)
AST: 31 U/L (ref 15–41)
Albumin: 2.9 g/dL — ABNORMAL LOW (ref 3.5–5.0)
Alkaline Phosphatase: 56 U/L (ref 38–126)
Anion gap: 10 (ref 5–15)
BUN: 14 mg/dL (ref 8–23)
CO2: 25 mmol/L (ref 22–32)
Calcium: 9.2 mg/dL (ref 8.9–10.3)
Chloride: 94 mmol/L — ABNORMAL LOW (ref 98–111)
Creatinine, Ser: 0.61 mg/dL (ref 0.44–1.00)
GFR, Estimated: >60 mL/min (ref >60)
Glucose, Bld: 125 mg/dL — ABNORMAL HIGH (ref 70–99)
Potassium: 4.1 mmol/L (ref 3.5–5.1)
Sodium: 129 mmol/L — ABNORMAL LOW (ref 135–145)
Total Bilirubin: 0.4 mg/dL (ref 0.3–1.2)
Total Protein: 6.3 g/dL — ABNORMAL LOW (ref 6.5–8.1)

## 2022-03-22 LAB — MAGNESIUM: Magnesium: 2 mg/dL (ref 1.7–2.4)

## 2022-03-22 LAB — URINE CULTURE: Culture: >=100000 — AB

## 2022-03-22 LAB — PHOSPHORUS: Phosphorus: 2.3 mg/dL — ABNORMAL LOW (ref 2.5–4.6)

## 2022-03-22 MED ORDER — SODIUM PHOSPHATES 45 MMOLE/15ML IV SOLN
10.0000 mmol | Freq: Once | INTRAVENOUS | Status: AC
  Administered 2022-03-22: 10 mmol via INTRAVENOUS
  Filled 2022-03-22: qty 3.33

## 2022-03-22 MED ORDER — CIPROFLOXACIN HCL 500 MG PO TABS: 500.0000 mg | ORAL_TABLET | Freq: Two times a day (BID) | ORAL | Status: DC

## 2022-03-22 MED ORDER — SODIUM CHLORIDE 0.9 % IV SOLN
1.0000 g | INTRAVENOUS | Status: DC
  Administered 2022-03-22: 1 g via INTRAVENOUS
  Filled 2022-03-22: qty 10

## 2022-03-22 MED ORDER — THIOTEPA CHEMO INJECTION 15 MG
60.0000 mg | Freq: Once | INTRAVENOUS | Status: AC
  Administered 2022-03-22: 60 mg via INTRAVENOUS
  Filled 2022-03-22: qty 5.77

## 2022-03-22 MED ORDER — HOT PACK MISC ONCOLOGY: 1.0000 | Freq: Once | Status: DC | PRN

## 2022-03-22 MED ORDER — LACTULOSE 10 GM/15ML PO SOLN
20.0000 g | Freq: Once | ORAL | Status: AC
  Administered 2022-03-22: 20 g via ORAL
  Filled 2022-03-22: qty 30

## 2022-03-22 MED ORDER — CEPHALEXIN 500 MG PO CAPS
500.0000 mg | ORAL_CAPSULE | Freq: Two times a day (BID) | ORAL | Status: AC
  Administered 2022-03-23 – 2022-03-27 (×10): 500 mg via ORAL
  Filled 2022-03-22 (×10): qty 1

## 2022-03-22 MED ORDER — VINCRISTINE SULFATE CHEMO INJECTION 1 MG/ML
2.0000 mg | Freq: Once | INTRAVENOUS | Status: AC
  Administered 2022-03-22: 2 mg via INTRAVENOUS
  Filled 2022-03-22: qty 2

## 2022-03-22 MED ORDER — SODIUM CHLORIDE 0.9 % IV SOLN
Freq: Once | INTRAVENOUS | Status: AC
  Administered 2022-03-22: 18 mg via INTRAVENOUS
  Filled 2022-03-22: qty 4

## 2022-03-22 NOTE — Progress Notes (Signed)
PT Cancellation Note  Patient Details Name: Ariel [name] MRN: [id number] DOB: 1955-12-28   Cancelled Treatment:    Reason Eval/Treat Not Completed: Other (comment) Attempted to see pt for PT tx. Pt received on [facility] requesting PT return later. Will f/u as able.  Lavone Nian, PT, DPT 03/22/22, 2:33 PM   [name] 03/22/2022, 2:33 PM

## 2022-03-22 NOTE — Progress Notes (Signed)
PT Cancellation Note  Patient Details Name: Ariel [name] MRN: [id number] DOB: 01-16-1956   Cancelled Treatment:    Reason Eval/Treat Not Completed: Other (comment)  Pt declined therapy due to too weak and nauseated.  Tried to encourage even bed exercises or short distance ambulation but she reports she feels too bad.  She was just up to [facility] but does report it was not easy.  Will attempt again tomorrow as able.  Initial recommendation was no therapy at d/c ; however, pt not mobilized with therapy since Saturday and appears weaker - may need updated recommendation when able to see pt.  Abran Richard, PT Acute Rehab [facility] [phone]   Karlton Lemon 03/22/2022, 3:17 PM

## 2022-03-22 NOTE — Progress Notes (Signed)
MD is fine with pt getting dexamethasone as part of premeds.

## 2022-03-22 NOTE — Progress Notes (Signed)
[name] has quite a bit of nausea.  I just wonder if the nausea is from the urinary tract infection that she has.  She has E. coli in the urine.  I will give her some Rocephin.  Will see if this may help this.  She had Rituxan yesterday.  I think she gets vincristine today.  I think there may also be thiotepa to be given if that is available.  She has had no bleeding.  She has had no bowel movement yet.  We may need to give her dose of lactulose.  She has had no cough or shortness of breath.  She has had no headache.  There is no visual changes.  She has had no leg swelling.  She has had no rashes.  Her labs show sodium 129.  Potassium is 4.1.  BUN 14 creatinine 0.61.  Her phosphorus is 2.3.  Calcium is 9.2 with an albumin of 2.9.  White cell count is 6.3.  Hemoglobin 11.6.  Platelet count 111,000.  Her vital signs are temperature of 98.6.  Pulse 81.  Blood pressure 116/59.  Her head and neck exam shows no ocular or oral lesions.  She has no adenopathy in the neck.  Lungs are clear bilaterally.  She has good air movement bilaterally.  Cardiac exam regular rate and rhythm.  Abdomen is soft.  Bowel sounds are present.  She has no fluid wave.  There is no palpable abdominal mass.  There is no palpable liver or spleen tip.  Extremity shows no clubbing, cyanosis or edema.  She has good strength in upper and lower extremities.  Neurological exam is nonfocal.  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  We started her on chemotherapy.  She will get day 2 of treatment today.  We really have to get this urinary tract infection treated.  I think Rocephin should be a good choice.  Will have to await the sensitivities of the E. coli.  I would think that it would be sensitive to Rocephin.  We also need to get her to have a bowel movement.  We will go ahead and give her a dose of lactulose.  She says that she will have help when she goes home.  I am unsure if her family would be agreeable to  that.  I know that she was at an assisted living facility when she was last treated.  This is something that Ariel [name] and her family will have to decide about.  I do appreciate the incredible care that she is getting from everybody on 6 E.  Ariel Haw, MD  Ariel [name] 41:10

## 2022-03-22 NOTE — Progress Notes (Signed)
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  CT of the head revealed increased edema in the area for left frontal lobe lesion with no acute hemorrhage or evidence of CVA.  MRI of the brain showed progression of lymphoma in the left frontal operculum.  Oncology following, started in house chemotherapy  Assessment & Plan:  Active Problems:   Hyperlipidemia   Melanoma (HCC)   High grade B-cell lymphoma (HCC)   Diffuse large B cell lymphoma (HCC)   CNS lymphoma (HCC)   Aphasia   High-grade non-Hodgkin's lymphoma with brain involvement: Follows with Dr. Marin Olp.  Also follows with radiation oncology, neuro-oncology.  MRI finding as above.  EEG without any evidence of epileptiform discharge.  Started on chemotherapy in 1/23  Fever/UTI: Currently afebrile.  Most likely from [place].  Urine culture showed pansensitive E. coli.  Currently on keflex  Aphasia with right-sided weakness: due to left frontal lobe CNS lymphoma lesion with worsening edema.  On high-dose steroids.  Continue chemotherapy  Hyponatremia: Mild.  Suspected to be from sodium bicarb drip with dextrose.  Continue monitoring  Hypokalemia: Currently being monitored and supplemented   Hypophosphatemia: Currently being monitored and supplemented as needed  Generalized weakness: PT/OT consulted        DVT prophylaxis:enoxaparin (LOVENOX) injection 40 mg Start: 03/20/22 1700     Code Status: Full Code  Family Communication: None at bedside  Patient status:Inpatient  Patient is from :Home  Anticipated discharge FG:HWEX  Estimated DC date:1-2 days   Consultants:  Oncology  Procedures:None  Antimicrobials:  Anti-infectives (From admission, onward)    Start     Dose/Rate Route Frequency Ordered Stop   03/23/22 1000  cephALEXin (KEFLEX) capsule 500 mg        500 mg Oral Every 12 hours 03/22/22 1056     03/22/22 0800  ciprofloxacin (CIPRO) tablet 500 mg  Status:  Discontinued        500 mg Oral 2 times daily 03/22/22 0635 03/22/22 0703   03/22/22 0800  cefTRIAXone (ROCEPHIN) 1 g in sodium chloride 0.9 % 100 mL IVPB  Status:  Discontinued        1 g 200 mL/hr over 30 Minutes Intravenous Every 24 hours 03/22/22 0703 03/22/22 1056       Subjective: Patient seen and examined at bedside today.  Hemodynamically stable.  Lying in bed.  Complains of weakness, not feeling well today.  Denies nausea, vomiting or chest pain.  Did not want to talk much  Objective: Vitals:   03/21/22 1342 03/21/22 2051 03/22/22 0505 03/22/22 1259  BP: (!) 108/46 (!) 115/58 (!) 116/59 (!) 115/56  Pulse: 75 72 81 80  Resp: '18 16 16 18  '$ Temp: 98.6 F (37 C) 98.1 F (36.7 C) 98.6 F (37 C) 98 F (36.7 C)  TempSrc: Oral Oral Oral Oral  SpO2: 97% 99% 97% 97%  Weight:      Height:        Intake/Output Summary (Last 24 hours) at 03/22/2022 1349 Last data filed at 03/22/2022 1142 Gross per 24 hour  Intake 3544.77 ml  Output 1200 ml  Net 2344.77 ml   Filed Weights   03/17/22 1000 03/17/22 1823  Weight: 69 kg  69 kg    Examination:  General exam: Not in distress,weak appearing HEENT: PERRL Respiratory system:  no wheezes or crackles  Cardiovascular system: S1 & S2 heard, RRR.  Gastrointestinal system: Abdomen is nondistended, soft and nontender. Central nervous system: Alert and oriented Extremities: No edema, no clubbing ,no cyanosis Skin: No rashes, no ulcers,no icterus     Data Reviewed: I have personally reviewed following labs and imaging studies  CBC: Recent Labs  Lab 03/17/22 1039 03/17/22 1050 03/18/22 0714 03/19/22 0338 03/20/22 0500  03/21/22 0532 03/22/22 0500  WBC 5.5  --  6.4 6.0 4.2 6.9 6.3  NEUTROABS 3.7  --   --  4.7 3.0 5.3 4.5  HGB 12.9   < > 11.0* 11.0* 11.1* 11.3* 11.6*  HCT 35.7*   < > 31.1* 31.1* 31.7* 31.7* 33.6*  MCV 91.3  --  92.6 92.0 93.2 92.4 93.6  PLT 146*  --  112* 111* 100* 119* 111*   < > = values in this interval not displayed.   Basic Metabolic Panel: Recent Labs  Lab 03/18/22 0714 03/19/22 0338 03/20/22 0500 03/20/22 1255 03/21/22 0532 03/22/22 0500  NA 131* 129* 130*  --  129* 129*  K 3.7 3.3* 3.1*  --  3.4* 4.1  CL 98 90* 92*  --  88* 94*  CO2 '22 27 27  '$ --  29 25  GLUCOSE 106* 155* 171*  --  133* 125*  BUN '23 16 14  '$ --  12 14  CREATININE 0.71 0.67 0.67  --  0.56 0.61  CALCIUM 9.1 9.2 9.0  --  9.0 9.2  MG  --   --   --  1.9 2.2 2.0  PHOS  --   --   --  1.8* 3.7 2.3*     Recent Results (from the past 240 hour(s))  Resp panel by RT-PCR (RSV, Flu A&B, Covid) Anterior Nasal Swab     Status: None   Collection Time: 03/17/22 11:21 AM   Specimen: Anterior Nasal Swab  Result Value Ref Range Status   SARS Coronavirus 2 by RT PCR NEGATIVE NEGATIVE Final    Comment: (NOTE) SARS-CoV-2 target nucleic acids are NOT DETECTED.  The SARS-CoV-2 RNA is generally detectable in upper respiratory specimens during the acute phase of infection. The lowest concentration of SARS-CoV-2 viral copies this assay can detect is 138 copies/mL. A negative result does not preclude SARS-Cov-2 infection and should not be used as the sole basis for treatment or other patient management decisions. A negative result may occur with  improper specimen collection/handling, submission of specimen other than nasopharyngeal swab, presence of viral mutation(s) within the areas targeted by this assay, and inadequate number of viral copies(<138 copies/mL). A negative result must be combined with clinical observations, patient history, and epidemiological information. The expected result is Negative.  Fact Sheet  for Patients:  EntrepreneurPulse.com.au  Fact Sheet for Healthcare Providers:  IncredibleEmployment.be  This test is no t yet approved or cleared by the [place] FDA and  has been authorized for detection and/or diagnosis of SARS-CoV-2 by FDA under an Emergency Use Authorization (EUA). This EUA will remain  in effect (meaning this test can be used) for the duration of the COVID-19 declaration under Section 564(b)(1) of the Act, 21 U.S.C.section 360bbb-3(b)(1), unless the authorization is terminated  or revoked sooner.       Influenza A by PCR NEGATIVE NEGATIVE Final   Influenza B by PCR NEGATIVE NEGATIVE Final    Comment: (NOTE) The  Xpert Xpress SARS-CoV-2/FLU/RSV plus assay is intended as an aid in the diagnosis of influenza from Nasopharyngeal swab specimens and should not be used as a sole basis for treatment. Nasal washings and aspirates are unacceptable for Xpert Xpress SARS-CoV-2/FLU/RSV testing.  Fact Sheet for Patients: EntrepreneurPulse.com.au  Fact Sheet for Healthcare Providers: IncredibleEmployment.be  This test is not yet approved or cleared by the [place] FDA and has been authorized for detection and/or diagnosis of SARS-CoV-2 by FDA under an Emergency Use Authorization (EUA). This EUA will remain in effect (meaning this test can be used) for the duration of the COVID-19 declaration under Section 564(b)(1) of the Act, 21 U.S.C. section 360bbb-3(b)(1), unless the authorization is terminated or revoked.     Resp Syncytial Virus by PCR NEGATIVE NEGATIVE Final    Comment: (NOTE) Fact Sheet for Patients: EntrepreneurPulse.com.au  Fact Sheet for Healthcare Providers: IncredibleEmployment.be  This test is not yet approved or cleared by the [place] FDA and has been authorized for detection and/or diagnosis of SARS-CoV-2 by FDA under an  Emergency Use Authorization (EUA). This EUA will remain in effect (meaning this test can be used) for the duration of the COVID-19 declaration under Section 564(b)(1) of the Act, 21 U.S.C. section 360bbb-3(b)(1), unless the authorization is terminated or revoked.  Performed at [facility], [place] [address] [place], [place] [address]   Urine Culture     Status: Abnormal   Collection Time: 03/20/22  7:05 AM   Specimen: Urine, Catheterized  Result Value Ref Range Status   Specimen Description   Final    URINE, CATHETERIZED Performed at [facility] [address], [place], [place] [address]    Special Requests   Final    Immunocompromised Performed at [facility], [place] [address] [place], [place] [address]    Culture >=100,000 COLONIES/mL ESCHERICHIA COLI (A)  Final   Report Status 03/22/2022 FINAL  Final   Organism ID, Bacteria ESCHERICHIA COLI (A)  Final      Susceptibility   Escherichia coli - MIC*    AMPICILLIN <=2 SENSITIVE Sensitive     CEFAZOLIN <=4 SENSITIVE Sensitive     CEFEPIME <=0.12 SENSITIVE Sensitive     CEFTRIAXONE <=0.25 SENSITIVE Sensitive     CIPROFLOXACIN <=0.25 SENSITIVE Sensitive     GENTAMICIN <=1 SENSITIVE Sensitive     IMIPENEM <=0.25 SENSITIVE Sensitive     NITROFURANTOIN <=16 SENSITIVE Sensitive     TRIMETH/SULFA <=20 SENSITIVE Sensitive     AMPICILLIN/SULBACTAM <=2 SENSITIVE Sensitive     PIP/TAZO <=4 SENSITIVE Sensitive     * >=100,000 COLONIES/mL ESCHERICHIA COLI     Radiology Studies: No results found.  Scheduled Meds:  antiseptic oral rinse  15 mL Mouth Rinse Q6H   [START ON 03/23/2022] cephALEXin  500 mg Oral Q12H   Chlorhexidine Gluconate Cloth  6 each Topical Daily   dexamethasone (DECADRON) injection  10 mg Intravenous Q24H   enoxaparin (LOVENOX) injection  40 mg Subcutaneous Q24H   famotidine  20 mg Oral Daily   naphazoline-glycerin  2 drop Both Eyes Q8H   polyethylene glycol  17 g Oral  BID   senna-docusate  1 tablet Oral BID   sodium bicarbonate/sodium chloride   Mouth Rinse Q4H   sodium chloride flush  10-40 mL Intracatheter Q12H   thiotepa 60 mg in sodium chloride 0.9 % 50 mL chemo infusion  60 mg Intravenous Once   vinCRIStine (ONCOVIN) 2 mg in sodium chloride 0.9 % 50 mL  chemo infusion  2 mg Intravenous Once   Continuous Infusions:  ondansetron (ZOFRAN) 8 mg, dexamethasone (DECADRON) 10 mg in sodium chloride 0.9 % 50 mL IVPB     sodium bicarbonate 150 mEq in dextrose 5 % 1,150 mL infusion Stopped (03/21/22 1046)   sodium phosphate 10 mmol in dextrose 5 % 250 mL infusion 10 mmol (03/22/22 1119)     LOS: 5 days   Shelly Coss, MD [facility] P1/24/2024, 1:49 PM

## 2022-03-22 NOTE — Progress Notes (Signed)
OT Cancellation Note  Patient Details Name: Ariel [name] MRN: [id number] DOB: 27-Jun-1955   Cancelled Treatment:    Reason Eval/Treat Not Completed: Other (comment) PT exiting room as this therapist arrived on hall. Patient declining to participate in therapy today. OT to continue to follow and check back as schedule will allow.  Rennie Plowman, MS Acute Rehabilitation Department Office# [phone]  03/22/2022, 3:18 PM

## 2022-03-22 NOTE — TOC Initial Note (Signed)
Transition of Care [facility]) - Initial/Assessment Note    Patient Details  Name: Ariel [name] MRN: [id number] Date of Birth: 1955/08/05  [facility] of Care [facility]) CM/SW Contact:    Angelita Ingles, RN Phone Number:[phone]  03/22/2022, 8:24 AM  Clinical Narrative:                  Transition of Care (TOC) Screening Note   Patient Details  Name: Ariel [name] Date of Birth: Nov 26, 1955   [facility]) CM/SW Contact:    Angelita Ingles, RN Phone Number: 03/22/2022, 8:24 AM    Transition of Care Department (TOC) has reviewed patient and no TOC needs have been identified at this time. We will continue to monitor patient advancement through interdisciplinary progression rounds. If new patient transition needs arise, please place a TOC consult.          Patient Goals and CMS Choice            Expected Discharge Plan and Services                                              Prior Living Arrangements/Services                       Activities of Daily Living Home Assistive Devices/Equipment: Cane (specify quad or straight) ADL Screening (condition at time of admission) Patient's cognitive ability adequate to safely complete daily activities?: Yes Is the patient deaf or have difficulty hearing?: No Does the patient have difficulty seeing, even when wearing glasses/contacts?: No Does the patient have difficulty concentrating, remembering, or making decisions?: No Patient able to express need for assistance with ADLs?: Yes Does the patient have difficulty dressing or bathing?: Yes Independently performs ADLs?: Yes (appropriate for developmental age) Does the patient have difficulty walking or climbing stairs?: Yes Weakness of Legs: Both Weakness of Arms/Hands: None  Permission Sought/Granted                  Emotional Assessment              Admission diagnosis:  Aphasia [R47.01] CNS lymphoma ([place]) [C85.89] Stroke-like  symptoms [R29.90] Patient Active Problem List   Diagnosis Date Noted   Aphasia 03/17/2022   CNS lymphoma ([place]) 03/10/2022   Primary localized osteoarthritis of right hip 01/10/2022   Primary localized osteoarthritis of left hip 10/11/2021   Preoperative clearance 09/13/2021   Encounter for screening mammogram for malignant neoplasm of breast 09/13/2021   Pancytopenia ([place]) 05/16/2021   Lymphoma malignant, 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[place]) 05/02/2021   Diffuse large B cell lymphoma ([place]) 03/07/2021   High grade B-cell lymphoma ([place]) 12/22/2020   Goals of care, counseling/discussion 12/22/2020   Malignant neoplasm metastatic to brain ([place]) 12/09/2020   Brain tumor ([place]) 12/08/2020   Hyponatremia 12/08/2020   Night sweat 19/41/7408   Complication of anesthesia 11/22/2020   PONV (postoperative nausea and vomiting) 11/22/2020   Hyperglycemia 11/10/2020   TIA (transient ischemic attack) 10/07/2020   Iron deficiency anemia 10/07/2020   Melanoma ([place]) 2021   Multinodular goiter 12/22/2013   Subclinical hyperthyroidism 12/19/2013   Urinary incontinence 11/19/2013   Hyperlipidemia 11/19/2012   Routine general medical examination at a health care facility 11/18/2012   PCP:  [name], NP Pharmacy:   [facility], [place] [address] [place] [address] Phone: [phone] Fax: [phone]     Social Determinants of Health (SDOH) Social History: [place]: No Food Insecurity (03/18/2022)  Housing: Low Risk  (03/18/2022)  Transportation Needs: No Transportation Needs (03/18/2022)  Utilities: Not At Risk (03/18/2022)  Alcohol Screen: Low Risk  (07/08/2020)  Depression (PHQ2-9): Low Risk  (07/13/2021)  Financial Resource Strain: Low Risk  (07/13/2021)  Physical Activity: Insufficiently Active (07/13/2021)  Social Connections: Socially Isolated (07/13/2021)  Stress: No Stress Concern Present (07/13/2021)  Tobacco Use: Low  Risk  (03/17/2022)   SDOH Interventions:     Readmission Risk Interventions    08/02/2021    3:11 PM 07/05/2021    2:17 PM 06/08/2021   11:18 AM  Readmission Risk Prevention Plan  Transportation Screening Complete Complete Complete  Medication Review Press photographer) Complete Complete Complete  PCP or Specialist appointment within 3-5 days of discharge Complete Complete Complete  HRI or Home Care Consult Complete Complete Complete  SW Recovery Care/Counseling Consult Complete Complete Complete  Palliative Care Screening Not Applicable Not Applicable Not [place] Not Applicable Not Applicable Not Applicable

## 2022-03-23 ENCOUNTER — Encounter (HOSPITAL_COMMUNITY): Payer: Self-pay | Admitting: Hematology & Oncology

## 2022-03-23 DIAGNOSIS — N390 Urinary tract infection, site not specified: Secondary | ICD-10-CM | POA: Diagnosis not present

## 2022-03-23 DIAGNOSIS — R945 Abnormal results of liver function studies: Secondary | ICD-10-CM | POA: Diagnosis not present

## 2022-03-23 DIAGNOSIS — Z515 Encounter for palliative care: Secondary | ICD-10-CM

## 2022-03-23 DIAGNOSIS — C8330 Diffuse large B-cell lymphoma, unspecified site: Secondary | ICD-10-CM | POA: Diagnosis not present

## 2022-03-23 DIAGNOSIS — C8332 Diffuse large B-cell lymphoma, intrathoracic lymph nodes: Secondary | ICD-10-CM | POA: Diagnosis not present

## 2022-03-23 LAB — COMPREHENSIVE METABOLIC PANEL
ALT: 84 U/L — ABNORMAL HIGH (ref 0–44)
AST: 53 U/L — ABNORMAL HIGH (ref 15–41)
Albumin: 3.1 g/dL — ABNORMAL LOW (ref 3.5–5.0)
Alkaline Phosphatase: 65 U/L (ref 38–126)
Anion gap: 10 (ref 5–15)
BUN: 22 mg/dL (ref 8–23)
CO2: 27 mmol/L (ref 22–32)
Calcium: 9 mg/dL (ref 8.9–10.3)
Chloride: 93 mmol/L — ABNORMAL LOW (ref 98–111)
Creatinine, Ser: 0.63 mg/dL (ref 0.44–1.00)
GFR, Estimated: >60 mL/min (ref >60)
Glucose, Bld: 158 mg/dL — ABNORMAL HIGH (ref 70–99)
Potassium: 3.8 mmol/L (ref 3.5–5.1)
Sodium: 130 mmol/L — ABNORMAL LOW (ref 135–145)
Total Bilirubin: 0.3 mg/dL (ref 0.3–1.2)
Total Protein: 6 g/dL — ABNORMAL LOW (ref 6.5–8.1)

## 2022-03-23 LAB — CBC WITH DIFFERENTIAL/PLATELET
Abs Immature Granulocytes: 0.1 10*3/uL — ABNORMAL HIGH (ref 0.00–0.07)
Basophils Absolute: 0 10*3/uL (ref 0.0–0.1)
Basophils Relative: 0 %
Eosinophils Absolute: 0 10*3/uL (ref 0.0–0.5)
Eosinophils Relative: 0 %
HCT: 34.4 % — ABNORMAL LOW (ref 36.0–46.0)
Hemoglobin: 12.1 g/dL (ref 12.0–15.0)
Immature Granulocytes: 1 %
Lymphocytes Relative: 16 %
Lymphs Abs: 1.1 10*3/uL (ref 0.7–4.0)
MCH: 32.4 pg (ref 26.0–34.0)
MCHC: 35.2 g/dL (ref 30.0–36.0)
MCV: 92 fL (ref 80.0–100.0)
Monocytes Absolute: 0.4 10*3/uL (ref 0.1–1.0)
Monocytes Relative: 6 %
Neutro Abs: 5.4 10*3/uL (ref 1.7–7.7)
Neutrophils Relative %: 77 %
Platelets: 114 10*3/uL — ABNORMAL LOW (ref 150–400)
RBC: 3.74 MIL/uL — ABNORMAL LOW (ref 3.87–5.11)
RDW: 11.9 % (ref 11.5–15.5)
WBC: 7.1 10*3/uL (ref 4.0–10.5)
nRBC: 0 % (ref 0.0–0.2)

## 2022-03-23 LAB — URINALYSIS, COMPLETE (UACMP) WITH MICROSCOPIC
Bilirubin Urine: NEGATIVE
Glucose, UA: NEGATIVE mg/dL
Hgb urine dipstick: NEGATIVE
Ketones, ur: NEGATIVE mg/dL
Nitrite: NEGATIVE
Protein, ur: 30 mg/dL — AB
Specific Gravity, Urine: 1.016 (ref 1.005–1.030)
WBC, UA: >50 WBC/hpf — ABNORMAL HIGH (ref 0–5)
pH: 8 (ref 5.0–8.0)

## 2022-03-23 LAB — MAGNESIUM: Magnesium: 2 mg/dL (ref 1.7–2.4)

## 2022-03-23 LAB — PHOSPHORUS: Phosphorus: 2.7 mg/dL (ref 2.5–4.6)

## 2022-03-23 MED ORDER — METHOTREXATE CHEMO INJECTION (PF) 1 GM **50 MG/ML**
1.5000 g/m2 | Freq: Once | INTRAVENOUS | Status: AC
  Administered 2022-03-23: 2.7 g via INTRAVENOUS
  Filled 2022-03-23: qty 54

## 2022-03-23 MED ORDER — METHOTREXATE SODIUM (PF) CHEMO INJECTION 1 GM/40ML
1.5000 g/m2 | Freq: Once | INTRAVENOUS | Status: DC
  Filled 2022-03-23: qty 108.6

## 2022-03-23 MED ORDER — METHOTREXATE SODIUM (PF) CHEMO INJECTION 1 GM/40ML
5.0000 g/m2 | Freq: Once | INTRAVENOUS | Status: AC
  Administered 2022-03-23: 9.05 g via INTRAVENOUS
  Filled 2022-03-23: qty 242

## 2022-03-23 MED ORDER — SODIUM CHLORIDE 0.9 % IV SOLN
Freq: Once | INTRAVENOUS | Status: AC
  Administered 2022-03-23: 16 mg via INTRAVENOUS
  Filled 2022-03-23: qty 8

## 2022-03-23 NOTE — Progress Notes (Signed)
PMT no charge note.  Briefly discussed with Dr Marin Olp a few days ago, patient undergoing inpatient chemotherapy, full code, full scope, continue current mode of care. No further PMT specific recommendations at this time. We will sign off at this time, please call us if we can be of further assistance.   Loistine Chance MD PMT [place] [phone].

## 2022-03-23 NOTE — Progress Notes (Signed)
MD is not using ARA-C with treatment that started 03/21/22, so he has gone up on dose of MTX.  Bolus will go over an hour followed by infusion over 3 hrs.  Fluid changed to D5W.

## 2022-03-23 NOTE — Progress Notes (Signed)
Nausea is better from Ariel [name].  She now is on antibiotics for the E. coli.  She has tolerated the chemotherapy well.  She will get the methotrexate today.  We will have to be very cautious with this.  Her LFTs are a little bit elevated.  Her labs show sodium 130.  Potassium 3.8.  BUN 22 creatinine 0.63.  Her calcium is 9.0.  Albumin is 3.1.  Her LFTs are a little bit elevated.  SGPT is 84 SGOT 53.  The white cell count is 7.1.  Hemoglobin 12.1.  Platelet count 114,000.  She has had no problems with her urine.  The sensitivities on the E. coli are pansensitive.  She did have a bowel movement yesterday.  She has had no bleeding.  She has had no fever.  There is no cough or shortness of breath.    Her urine, needs to check for pH.  I would have to believe that the urine is still alkaline.  There is no fever.  All vital signs are stable.  Her temperatures 98.  Pulse 57.  Blood pressure 137/73 her head neck exam shows no ocular or oral lesions.  Oral mucosa is slightly dry.  Her lungs are clear bilaterally.  Cardiac exam regular rate and rhythm.  Abdomen soft.  Bowel sounds are present.  There is no guarding or rebound tenderness.  Neurological exam is nonfocal.  Today, Ariel [name] will start the methotrexate.  Her LFTs are slightly elevated.  I am unsure as to why this would be although this might be from her treatment.  Might also be from the antibiotic.  We will just have to monitor this.  She will need leucovorin rescue.  I know that pharmacy does a good job in managing this for [place].  I do appreciate the great care that she is getting from everybody on 6 E.   [name], MD  Colossians 3:23

## 2022-03-23 NOTE — Progress Notes (Signed)
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  CT of the head revealed increased edema in the area for left frontal lobe lesion with no acute hemorrhage or evidence of CVA.  MRI of the brain showed progression of lymphoma in the left frontal operculum.  Oncology following, started in house chemotherapy  Assessment & Plan:  Active Problems:   Hyperlipidemia   Melanoma (HCC)   High grade B-cell lymphoma (HCC)   Diffuse large B cell lymphoma (HCC)   CNS lymphoma (HCC)   Aphasia   High-grade non-Hodgkin's lymphoma with brain involvement: Follows with Dr. Marin Olp.  Also follows with radiation oncology, neuro-oncology.  MRI finding as above.  EEG without any evidence of epileptiform discharge.  Started on chemotherapy in 1/23  Fever/UTI: Currently afebrile.  Most likely from [place].  Urine culture showed pansensitive E. coli.  Currently on keflex  Aphasia with right-sided weakness: due to left frontal lobe CNS lymphoma lesion with worsening edema.  On high-dose steroids.  Continue chemotherapy  Hyponatremia: Mild.  Suspected to be from sodium bicarb drip with dextrose.  Continue monitoring.Now improving  Hypokalemia: Currently being monitored and supplemented   Hypophosphatemia: Currently being monitored and supplemented as needed  Generalized weakness: PT/OT consulted        DVT prophylaxis:enoxaparin (LOVENOX) injection 40 mg Start: 03/20/22 1700     Code Status: Full Code  Family Communication: None at bedside.Patient says no need to call anybody  Patient status:Inpatient  Patient is from :Home  Anticipated discharge TI:WPYK  Estimated DC  date:1-2 days   Consultants: Oncology  Procedures:None  Antimicrobials:  Anti-infectives (From admission, onward)    Start     Dose/Rate Route Frequency Ordered Stop   03/23/22 1000  cephALEXin (KEFLEX) capsule 500 mg        500 mg Oral Every 12 hours 03/22/22 1056     03/22/22 0800  ciprofloxacin (CIPRO) tablet 500 mg  Status:  Discontinued        500 mg Oral 2 times daily 03/22/22 0635 03/22/22 0703   03/22/22 0800  cefTRIAXone (ROCEPHIN) 1 g in sodium chloride 0.9 % 100 mL IVPB  Status:  Discontinued        1 g 200 mL/hr over 30 Minutes Intravenous Every 24 hours 03/22/22 0703 03/22/22 1056       Subjective: Patient seen and examined at bedside today.  Hemodynamically  stable without any complaints.  Complains of weakness.  Denies any nausea, vomiting, diarrhea or abdominal pain.  Objective: Vitals:   03/22/22 0505 03/22/22 1259 03/22/22 2245 03/23/22 0543  BP: (!) 116/59 (!) 115/56 (!) 145/70 137/73  Pulse: 81 80 67 67  Resp: '16 18 14 14  '$ Temp: 98.6 F (37 C) 98 F (36.7 C) 97.7 F (36.5 C) 98 F (36.7 C)  TempSrc: Oral Oral Oral Oral  SpO2: 97% 97% 97% 98%  Weight:      Height:        Intake/Output Summary (Last 24 hours) at 03/23/2022 1114 Last data filed at 03/23/2022 0807 Gross per 24 hour  Intake 694.16 ml  Output 1150 ml  Net -455.84 ml   Filed Weights   03/17/22 1000 03/17/22 1823  Weight: 69 kg 69 kg  Examination:  General exam: Overall comfortable, not in distress,weak appearing HEENT: PERRL Respiratory system:  no wheezes or crackles  Cardiovascular system: S1 & S2 heard, RRR.  Gastrointestinal system: Abdomen is nondistended, soft and nontender. Central nervous system: Alert and oriented Extremities: No edema, no clubbing ,no cyanosis Skin: No rashes, no ulcers,no icterus     Data Reviewed: I have personally reviewed following labs and imaging studies  CBC: Recent Labs  Lab 03/19/22 0338 03/20/22 0500 03/21/22 0532 03/22/22 0500  03/23/22 0543  WBC 6.0 4.2 6.9 6.3 7.1  NEUTROABS 4.7 3.0 5.3 4.5 5.4  HGB 11.0* 11.1* 11.3* 11.6* 12.1  HCT 31.1* 31.7* 31.7* 33.6* 34.4*  MCV 92.0 93.2 92.4 93.6 92.0  PLT 111* 100* 119* 111* 710*   Basic Metabolic Panel: Recent Labs  Lab 03/19/22 0338 03/20/22 0500 03/20/22 1255 03/21/22 0532 03/22/22 0500 03/23/22 0543  NA 129* 130*  --  129* 129* 130*  K 3.3* 3.1*  --  3.4* 4.1 3.8  CL 90* 92*  --  88* 94* 93*  CO2 27 27  --  '29 25 27  '$ GLUCOSE 155* 171*  --  133* 125* 158*  BUN 16 14  --  '12 14 22  '$ CREATININE 0.67 0.67  --  0.56 0.61 0.63  CALCIUM 9.2 9.0  --  9.0 9.2 9.0  MG  --   --  1.9 2.2 2.0 2.0  PHOS  --   --  1.8* 3.7 2.3* 2.7     Recent Results (from the past 240 hour(s))  Resp panel by RT-PCR (RSV, Flu A&B, Covid) Anterior Nasal Swab     Status: None   Collection Time: 03/17/22 11:21 AM   Specimen: Anterior Nasal Swab  Result Value Ref Range Status   SARS Coronavirus 2 by RT PCR NEGATIVE NEGATIVE Final    Comment: (NOTE) SARS-CoV-2 target nucleic acids are NOT DETECTED.  The SARS-CoV-2 RNA is generally detectable in upper respiratory specimens during the acute phase of infection. The lowest concentration of SARS-CoV-2 viral copies this assay can detect is 138 copies/mL. A negative result does not preclude SARS-Cov-2 infection and should not be used as the sole basis for treatment or other patient management decisions. A negative result may occur with  improper specimen collection/handling, submission of specimen other than nasopharyngeal swab, presence of viral mutation(s) within the areas targeted by this assay, and inadequate number of viral copies(<138 copies/mL). A negative result must be combined with clinical observations, patient history, and epidemiological information. The expected result is Negative.  Fact Sheet for Patients:  EntrepreneurPulse.com.au  Fact Sheet for Healthcare Providers:   IncredibleEmployment.be  This test is no t yet approved or cleared by the [place] FDA and  has been authorized for detection and/or diagnosis of SARS-CoV-2 by FDA under an Emergency Use Authorization (EUA). This EUA will remain  in effect (meaning this test can be used) for the duration of the COVID-19 declaration under Section 564(b)(1) of the Act, 21 U.S.C.section 360bbb-3(b)(1), unless the authorization is terminated  or revoked sooner.       Influenza A by PCR NEGATIVE NEGATIVE Final   Influenza B by PCR NEGATIVE NEGATIVE Final    Comment: (NOTE) The Xpert Xpress SARS-CoV-2/FLU/RSV plus assay is intended as an aid in the diagnosis of influenza from Nasopharyngeal swab specimens and should not be used as a sole basis for treatment. Nasal washings and aspirates are unacceptable for Xpert Xpress SARS-CoV-2/FLU/RSV testing.  Fact Sheet for Patients: EntrepreneurPulse.com.au  Fact Sheet  for Healthcare Providers: IncredibleEmployment.be  This test is not yet approved or cleared by the [place] and has been authorized for detection and/or diagnosis of SARS-CoV-2 by FDA under an Emergency Use Authorization (EUA). This EUA will remain in effect (meaning this test can be used) for the duration of the COVID-19 declaration under Section 564(b)(1) of the Act, 21 U.S.C. section 360bbb-3(b)(1), unless the authorization is terminated or revoked.     Resp Syncytial Virus by PCR NEGATIVE NEGATIVE Final    Comment: (NOTE) Fact Sheet for Patients: EntrepreneurPulse.com.au  Fact Sheet for Healthcare Providers: IncredibleEmployment.be  This test is not yet approved or cleared by the [place] FDA and has been authorized for detection and/or diagnosis of SARS-CoV-2 by FDA under an Emergency Use Authorization (EUA). This EUA will remain in effect (meaning this test can be used) for  the duration of the COVID-19 declaration under Section 564(b)(1) of the Act, 21 U.S.C. section 360bbb-3(b)(1), unless the authorization is terminated or revoked.  Performed at [facility], [place] [address] [place], [place] [address]   Urine Culture     Status: Abnormal   Collection Time: 03/20/22  7:05 AM   Specimen: Urine, Catheterized  Result Value Ref Range Status   Specimen Description   Final    URINE, CATHETERIZED Performed at [place] [address], [place], [place] [address]    Special Requests   Final    Immunocompromised Performed at [facility], [place] [address], [place], [place] [address]    Culture >=100,000 COLONIES/mL ESCHERICHIA COLI (A)  Final   Report Status 03/22/2022 FINAL  Final   Organism ID, Bacteria ESCHERICHIA COLI (A)  Final      Susceptibility   Escherichia coli - MIC*    AMPICILLIN <=2 SENSITIVE Sensitive     CEFAZOLIN <=4 SENSITIVE Sensitive     CEFEPIME <=0.12 SENSITIVE Sensitive     CEFTRIAXONE <=0.25 SENSITIVE Sensitive     CIPROFLOXACIN <=0.25 SENSITIVE Sensitive     GENTAMICIN <=1 SENSITIVE Sensitive     IMIPENEM <=0.25 SENSITIVE Sensitive     NITROFURANTOIN <=16 SENSITIVE Sensitive     TRIMETH/SULFA <=20 SENSITIVE Sensitive     AMPICILLIN/SULBACTAM <=2 SENSITIVE Sensitive     PIP/TAZO <=4 SENSITIVE Sensitive     * >=100,000 COLONIES/mL ESCHERICHIA COLI     Radiology Studies: No results found.  Scheduled Meds:  antiseptic oral rinse  15 mL Mouth Rinse Q6H   cephALEXin  500 mg Oral Q12H   Chlorhexidine Gluconate Cloth  6 each Topical Daily   dexamethasone (DECADRON) injection  10 mg Intravenous Q24H   enoxaparin (LOVENOX) injection  40 mg Subcutaneous Q24H   famotidine  20 mg Oral Daily   methotrexate (PF) 2.715 g in dextrose 5 % 250 mL chemo infusion  1.5 g/m2 (Treatment Plan Recorded) Intravenous Once   methotrexate (PF) 9.05 g in dextrose 5 % 1,000 mL chemo infusion  5 g/m2  (Treatment Plan Recorded) Intravenous Once   naphazoline-glycerin  2 drop Both Eyes Q8H   polyethylene glycol  17 g Oral BID   senna-docusate  1 tablet Oral BID   sodium bicarbonate/sodium chloride   Mouth Rinse Q4H   sodium chloride flush  10-40 mL Intracatheter Q12H   Continuous Infusions:  ondansetron (ZOFRAN) 16 mg, dexamethasone (DECADRON) 20 mg in sodium chloride 0.9 % 50 mL IVPB     sodium bicarbonate 150 mEq in dextrose 5 % 1,150 mL infusion 50 mL/hr at 03/23/22 0307  LOS: 6 days   Shelly Coss, MD [facility] P1/25/2024, 11:14 AM

## 2022-03-24 ENCOUNTER — Encounter (HOSPITAL_COMMUNITY): Payer: Self-pay | Admitting: Hematology & Oncology

## 2022-03-24 ENCOUNTER — Other Ambulatory Visit: Payer: Self-pay | Admitting: Pharmacist

## 2022-03-24 DIAGNOSIS — C8589 Other specified types of non-Hodgkin lymphoma, extranodal and solid organ sites: Secondary | ICD-10-CM | POA: Diagnosis not present

## 2022-03-24 DIAGNOSIS — R945 Abnormal results of liver function studies: Secondary | ICD-10-CM | POA: Diagnosis not present

## 2022-03-24 DIAGNOSIS — N390 Urinary tract infection, site not specified: Secondary | ICD-10-CM | POA: Diagnosis not present

## 2022-03-24 DIAGNOSIS — C8330 Diffuse large B-cell lymphoma, unspecified site: Secondary | ICD-10-CM | POA: Diagnosis not present

## 2022-03-24 LAB — CBC WITH DIFFERENTIAL/PLATELET
Abs Immature Granulocytes: 0.08 10*3/uL — ABNORMAL HIGH (ref 0.00–0.07)
Basophils Absolute: 0 10*3/uL (ref 0.0–0.1)
Basophils Relative: 0 %
Eosinophils Absolute: 0 10*3/uL (ref 0.0–0.5)
Eosinophils Relative: 0 %
HCT: 32.5 % — ABNORMAL LOW (ref 36.0–46.0)
Hemoglobin: 11.9 g/dL — ABNORMAL LOW (ref 12.0–15.0)
Immature Granulocytes: 1 %
Lymphocytes Relative: 10 %
Lymphs Abs: 0.8 10*3/uL (ref 0.7–4.0)
MCH: 32.4 pg (ref 26.0–34.0)
MCHC: 36.6 g/dL — ABNORMAL HIGH (ref 30.0–36.0)
MCV: 88.6 fL (ref 80.0–100.0)
Monocytes Absolute: 0.5 10*3/uL (ref 0.1–1.0)
Monocytes Relative: 7 %
Neutro Abs: 6.9 10*3/uL (ref 1.7–7.7)
Neutrophils Relative %: 82 %
Platelets: 108 10*3/uL — ABNORMAL LOW (ref 150–400)
RBC: 3.67 MIL/uL — ABNORMAL LOW (ref 3.87–5.11)
RDW: 11.7 % (ref 11.5–15.5)
WBC: 8.3 10*3/uL (ref 4.0–10.5)
nRBC: 0 % (ref 0.0–0.2)

## 2022-03-24 LAB — URINALYSIS, COMPLETE (UACMP) WITH MICROSCOPIC
Bilirubin Urine: NEGATIVE
Glucose, UA: >=500 mg/dL — AB
Hgb urine dipstick: NEGATIVE
Ketones, ur: NEGATIVE mg/dL
Leukocytes,Ua: NEGATIVE
Nitrite: NEGATIVE
Protein, ur: 100 mg/dL — AB
Specific Gravity, Urine: 1.011 (ref 1.005–1.030)
pH: 8 (ref 5.0–8.0)

## 2022-03-24 LAB — COMPREHENSIVE METABOLIC PANEL
ALT: 147 U/L — ABNORMAL HIGH (ref 0–44)
AST: 76 U/L — ABNORMAL HIGH (ref 15–41)
Albumin: 3.2 g/dL — ABNORMAL LOW (ref 3.5–5.0)
Alkaline Phosphatase: 71 U/L (ref 38–126)
Anion gap: 11 (ref 5–15)
BUN: 22 mg/dL (ref 8–23)
CO2: 27 mmol/L (ref 22–32)
Calcium: 8.9 mg/dL (ref 8.9–10.3)
Chloride: 91 mmol/L — ABNORMAL LOW (ref 98–111)
Creatinine, Ser: 0.57 mg/dL (ref 0.44–1.00)
GFR, Estimated: >60 mL/min (ref >60)
Glucose, Bld: 126 mg/dL — ABNORMAL HIGH (ref 70–99)
Potassium: 3.6 mmol/L (ref 3.5–5.1)
Sodium: 129 mmol/L — ABNORMAL LOW (ref 135–145)
Total Bilirubin: 0.6 mg/dL (ref 0.3–1.2)
Total Protein: 6.2 g/dL — ABNORMAL LOW (ref 6.5–8.1)

## 2022-03-24 LAB — MAGNESIUM: Magnesium: 2.2 mg/dL (ref 1.7–2.4)

## 2022-03-24 LAB — PHOSPHORUS: Phosphorus: 3 mg/dL (ref 2.5–4.6)

## 2022-03-24 MED ORDER — SODIUM CHLORIDE 0.9 % IV SOLN
150.0000 mg | Freq: Once | INTRAVENOUS | Status: AC
  Administered 2022-03-24: 150 mg via INTRAVENOUS
  Filled 2022-03-24: qty 5

## 2022-03-24 MED ORDER — ADULT MULTIVITAMIN W/MINERALS CH
1.0000 | ORAL_TABLET | Freq: Every day | ORAL | Status: DC
  Administered 2022-03-24 – 2022-04-14 (×20): 1 via ORAL
  Filled 2022-03-24 (×22): qty 1

## 2022-03-24 MED ORDER — LEUCOVORIN CALCIUM INJECTION 100 MG
30.0000 mg/m2 | Freq: Four times a day (QID) | INTRAMUSCULAR | Status: AC
  Administered 2022-03-24 (×3): 54 mg via INTRAVENOUS
  Filled 2022-03-24 (×11): qty 2.7

## 2022-03-24 MED ORDER — POLYETHYLENE GLYCOL 3350 17 G PO PACK: 17.0000 g | PACK | Freq: Every day | ORAL | Status: DC | PRN

## 2022-03-24 MED ORDER — ONDANSETRON 8 MG/NS 50 ML IVPB
8.0000 mg | Freq: Three times a day (TID) | INTRAVENOUS | Status: AC
  Administered 2022-03-24 – 2022-03-26 (×7): 8 mg via INTRAVENOUS
  Filled 2022-03-24 (×4): qty 8
  Filled 2022-03-24 (×2): qty 54
  Filled 2022-03-24 (×3): qty 8

## 2022-03-24 MED ORDER — LEUCOVORIN CALCIUM INJECTION 350 MG: 30.0000 mg | Freq: Four times a day (QID) | INTRAVENOUS | Status: DC

## 2022-03-24 MED ORDER — SODIUM CHLORIDE 0.9 % IV SOLN
8.0000 mg | Freq: Three times a day (TID) | INTRAVENOUS | Status: DC
  Filled 2022-03-24 (×2): qty 4

## 2022-03-24 MED ORDER — OLANZAPINE 5 MG PO TABS
5.0000 mg | ORAL_TABLET | Freq: Every day | ORAL | Status: DC
  Administered 2022-03-24 – 2022-03-26 (×3): 5 mg via ORAL
  Filled 2022-03-24 (×4): qty 1

## 2022-03-24 MED ORDER — SENNOSIDES-DOCUSATE SODIUM 8.6-50 MG PO TABS
1.0000 | ORAL_TABLET | Freq: Every evening | ORAL | Status: DC | PRN
  Administered 2022-03-27 – 2022-04-01 (×6): 1 via ORAL
  Filled 2022-03-24 (×6): qty 1

## 2022-03-24 MED ORDER — ENSURE ENLIVE PO LIQD
237.0000 mL | Freq: Three times a day (TID) | ORAL | Status: DC
  Administered 2022-03-24 – 2022-04-14 (×55): 237 mL via ORAL

## 2022-03-24 NOTE — Care Management Important Message (Signed)
Important Message  Patient Details IM Letter given. Name: Twila Rappa MRN: [id number] Date of Birth: 1956-01-08   Medicare Important Message Given:  Yes     [name] 03/24/2022, 9:52 AM

## 2022-03-24 NOTE — Progress Notes (Signed)
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  CT of the head revealed increased edema in the area for left frontal lobe lesion with no acute hemorrhage or evidence of CVA.  MRI of the brain showed progression of lymphoma in the left frontal operculum.  Oncology following, started in house chemotherapy  Assessment & Plan:  Active Problems:   Hyperlipidemia   Melanoma ([place])   High grade B-cell lymphoma (HCC)   Diffuse large B cell lymphoma (HCC)   CNS lymphoma ([place])   Aphasia   Palliative care by specialist   High-grade non-Hodgkin's lymphoma with brain involvement: Follows with Dr. Marin Olp.  Also follows with radiation oncology, neuro-oncology.  MRI finding as above.  EEG without any evidence of epileptiform discharge.  Started on chemotherapy in 1/23,completed first cycle.Started on leucovorin. Complains of nausea today  Fever/UTI: Currently afebrile.    Urine culture showed pansensitive E. coli.  Currently on keflex  Aphasia with right-sided weakness: due to left frontal lobe CNS lymphoma lesion with worsening edema.  On high-dose steroids.  Continue chemotherapy  Hyponatremia: Mild.  Suspected to be from sodium bicarb drip with dextrose.  Continue monitoring.  Hypokalemia: Currently being monitored and supplemented as needed  Hypophosphatemia: Supplemented and corrected  Elevated liver enzymes: Most likely secondary to chemotherapy.  Continue to monitor.  Also has mild thrombocytopenia which could be associated with malignancy  Generalized weakness: PT/OT consulted        DVT prophylaxis:enoxaparin (LOVENOX) injection 40 mg Start: 03/20/22 1700      Code Status: Full Code  Family Communication: None at bedside.Patient says no need to call anybody  Patient status:Inpatient  Patient is from :Home  Anticipated discharge EH:OZYY  Estimated DC date:after oncology clearance   Consultants: Oncology  Procedures:None  Antimicrobials:  Anti-infectives (From admission, onward)    Start     Dose/Rate Route Frequency Ordered Stop   03/23/22 1000  cephALEXin (KEFLEX) capsule 500 mg        500 mg Oral Every 12 hours 03/22/22 1056     03/22/22 0800  ciprofloxacin (CIPRO) tablet 500 mg  Status:  Discontinued        500 mg Oral 2 times daily 03/22/22 0635 03/22/22 0703   03/22/22 0800  cefTRIAXone (ROCEPHIN) 1 g in sodium chloride 0.9 % 100 mL IVPB  Status:  Discontinued        1 g 200 mL/hr over 30 Minutes Intravenous Every 24 hours 03/22/22 0703 03/22/22 1056       Subjective: Patient seen and examined at bedside today.  Hemodynamically stable.  Lying in bed.  She appears weak, complains of nausea today.  No diarrhea or vomiting though.  Objective: Vitals:   03/23/22 0543 03/23/22 1327 03/23/22 2152 03/24/22 0743  BP: 137/73 129/60 123/66 116/65  Pulse: 67 69 (!) 59 69  Resp: '14 20 17 18  '$ Temp: 98 F (36.7 C) 98.2 F (36.8 C) 98.1 F (36.7 C) 98.5 F (36.9 C)  TempSrc: Oral Oral Oral Oral  SpO2: 98% 96% 98% 98%  Weight:      Height:        Intake/Output Summary (Last 24 hours) at 03/24/2022 1121 Last data filed at 03/24/2022 0700 Gross per  24 hour  Intake 120 ml  Output 3850 ml  Net -3730 ml   Filed Weights   03/17/22 1000 03/17/22 1823  Weight: 69 kg 69 kg    Examination:  General exam: weak,lying on bed, not in distress HEENT: PERRL Respiratory system:  no wheezes or crackles  Cardiovascular system: S1 & S2 heard, RRR.  Gastrointestinal system: Abdomen is nondistended, soft and nontender. Central nervous system: Alert and oriented Extremities: No edema, no clubbing ,no cyanosis Skin: No rashes, no  ulcers,no icterus     Data Reviewed: I have personally reviewed following labs and imaging studies  CBC: Recent Labs  Lab 03/20/22 0500 03/21/22 0532 03/22/22 0500 03/23/22 0543 03/24/22 0610  WBC 4.2 6.9 6.3 7.1 8.3  NEUTROABS 3.0 5.3 4.5 5.4 6.9  HGB 11.1* 11.3* 11.6* 12.1 11.9*  HCT 31.7* 31.7* 33.6* 34.4* 32.5*  MCV 93.2 92.4 93.6 92.0 88.6  PLT 100* 119* 111* 114* 106*   Basic Metabolic Panel: Recent Labs  Lab 03/20/22 0500 03/20/22 1255 03/21/22 0532 03/22/22 0500 03/23/22 0543 03/24/22 0610  NA 130*  --  129* 129* 130* 129*  K 3.1*  --  3.4* 4.1 3.8 3.6  CL 92*  --  88* 94* 93* 91*  CO2 27  --  '29 25 27 27  '$ GLUCOSE 171*  --  133* 125* 158* 126*  BUN 14  --  '12 14 22 22  '$ CREATININE 0.67  --  0.56 0.61 0.63 0.57  CALCIUM 9.0  --  9.0 9.2 9.0 8.9  MG  --  1.9 2.2 2.0 2.0 2.2  PHOS  --  1.8* 3.7 2.3* 2.7 3.0     Recent Results (from the past 240 hour(s))  Resp panel by RT-PCR (RSV, Flu A&B, Covid) Anterior Nasal Swab     Status: None   Collection Time: 03/17/22 11:21 AM   Specimen: Anterior Nasal Swab  Result Value Ref Range Status   SARS Coronavirus 2 by RT PCR NEGATIVE NEGATIVE Final    Comment: (NOTE) SARS-CoV-2 target nucleic acids are NOT DETECTED.  The SARS-CoV-2 RNA is generally detectable in upper respiratory specimens during the acute phase of infection. The lowest concentration of SARS-CoV-2 viral copies this assay can detect is 138 copies/mL. A negative result does not preclude SARS-Cov-2 infection and should not be used as the sole basis for treatment or other patient management decisions. A negative result may occur with  improper specimen collection/handling, submission of specimen other than nasopharyngeal swab, presence of viral mutation(s) within the areas targeted by this assay, and inadequate number of viral copies(<138 copies/mL). A negative result must be combined with clinical observations, patient history, and  epidemiological information. The expected result is Negative.  Fact Sheet for Patients:  EntrepreneurPulse.com.au  Fact Sheet for Healthcare Providers:  IncredibleEmployment.be  This test is no t yet approved or cleared by the [place] FDA and  has been authorized for detection and/or diagnosis of SARS-CoV-2 by FDA under an Emergency Use Authorization (EUA). This EUA will remain  in effect (meaning this test can be used) for the duration of the COVID-19 declaration under Section 564(b)(1) of the Act, 21 U.S.C.section 360bbb-3(b)(1), unless the authorization is terminated  or revoked sooner.       Influenza A by PCR NEGATIVE NEGATIVE Final   Influenza B by PCR NEGATIVE NEGATIVE Final    Comment: (NOTE) The Xpert Xpress SARS-CoV-2/FLU/RSV plus assay is intended as an aid in the diagnosis of influenza from Nasopharyngeal swab specimens and should  not be used as a sole basis for treatment. Nasal washings and aspirates are unacceptable for Xpert Xpress SARS-CoV-2/FLU/RSV testing.  Fact Sheet for Patients: EntrepreneurPulse.com.au  Fact Sheet for Healthcare Providers: IncredibleEmployment.be  This test is not yet approved or cleared by the [place] FDA and has been authorized for detection and/or diagnosis of SARS-CoV-2 by FDA under an Emergency Use Authorization (EUA). This EUA will remain in effect (meaning this test can be used) for the duration of the COVID-19 declaration under Section 564(b)(1) of the Act, 21 U.S.C. section 360bbb-3(b)(1), unless the authorization is terminated or revoked.     Resp Syncytial Virus by PCR NEGATIVE NEGATIVE Final    Comment: (NOTE) Fact Sheet for Patients: EntrepreneurPulse.com.au  Fact Sheet for Healthcare Providers: IncredibleEmployment.be  This test is not yet approved or cleared by the [place] FDA and has been  authorized for detection and/or diagnosis of SARS-CoV-2 by FDA under an Emergency Use Authorization (EUA). This EUA will remain in effect (meaning this test can be used) for the duration of the COVID-19 declaration under Section 564(b)(1) of the Act, 21 U.S.C. section 360bbb-3(b)(1), unless the authorization is terminated or revoked.  Performed at [facility], [place] [address] [place], [place] [address]   Urine Culture     Status: Abnormal   Collection Time: 03/20/22  7:05 AM   Specimen: Urine, Catheterized  Result Value Ref Range Status   Specimen Description   Final    URINE, CATHETERIZED Performed at [facility] [address] [place], [place] [address]    Special Requests   Final    Immunocompromised Performed at [facility], [place] [address], [place], [place] [address]    Culture >=100,000 COLONIES/mL ESCHERICHIA COLI (A)  Final   Report Status 03/22/2022 FINAL  Final   Organism ID, Bacteria ESCHERICHIA COLI (A)  Final      Susceptibility   Escherichia coli - MIC*    AMPICILLIN <=2 SENSITIVE Sensitive     CEFAZOLIN <=4 SENSITIVE Sensitive     CEFEPIME <=0.12 SENSITIVE Sensitive     CEFTRIAXONE <=0.25 SENSITIVE Sensitive     CIPROFLOXACIN <=0.25 SENSITIVE Sensitive     GENTAMICIN <=1 SENSITIVE Sensitive     IMIPENEM <=0.25 SENSITIVE Sensitive     NITROFURANTOIN <=16 SENSITIVE Sensitive     TRIMETH/SULFA <=20 SENSITIVE Sensitive     AMPICILLIN/SULBACTAM <=2 SENSITIVE Sensitive     PIP/TAZO <=4 SENSITIVE Sensitive     * >=100,000 COLONIES/mL ESCHERICHIA COLI     Radiology Studies: No results found.  Scheduled Meds:  antiseptic oral rinse  15 mL Mouth Rinse Q6H   cephALEXin  500 mg Oral Q12H   Chlorhexidine Gluconate Cloth  6 each Topical Daily   dexamethasone (DECADRON) injection  10 mg Intravenous Q24H   enoxaparin (LOVENOX) injection  40 mg Subcutaneous Q24H   famotidine  20 mg Oral Daily   leucovorin  30 mg/m2  (Treatment Plan Recorded) Intravenous Q6H   naphazoline-glycerin  2 drop Both Eyes Q8H   OLANZapine  5 mg Oral QHS   sodium bicarbonate/sodium chloride   Mouth Rinse Q4H   sodium chloride flush  10-40 mL Intracatheter Q12H   Continuous Infusions:  ondansetron 8 mg (03/24/22 1038)   sodium bicarbonate 150 mEq in dextrose 5 % 1,150 mL infusion 50 mL/hr at 03/23/22 2109     LOS: 7 days   Shelly Coss, MD [facility] P1/26/2024, 11:21 AM

## 2022-03-24 NOTE — Progress Notes (Signed)
She completed her protocol yesterday.  She does not feel that well this morning.  I will have that get her on scheduled Zofran.  I will give her a dose of Emend today.  I will also have her on Zyprexa.  We will start the leucovorin rescue today.  I will check her methotrexate levels tomorrow.  She is on Keflex for the UTI.  Her last urinalysis still shows some white blood cells.  There were rare bacteria though.  She is weak.  I do worry about her being able to go home after we get her methotrexate levels down.  However, she feels that she will be able to go home as she will have help.  Not surprisingly, her liver function studies are elevated.  Her SGPT is 147.  SGOT 76.  Her sodium is 129.  Potassium 3.6.  BUN is 22 creatinine 0.57.  Her phosphorus is 3.0.  Calcium is 8.9.  Her white cell count is 8.3.  Hemoglobin is 11.9.  Platelet count 108,000.  She has had a little bit of diarrhea.  We will make the MiraLAX and Senokot-S as needed now.  She has had no bleeding.  She has had no fever.  She is still using the mouth rinses.  She has had no headache.  There is no cough or shortness of breath.  Her vital signs show temperature of 98.1.  Pulse 59.  Blood pressure 123/66.  Her head neck exam shows no mucositis.  There is no scleral icterus.  Extraocular muscles are intact.  Lungs are clear.  Cardiac exam regular rate and rhythm.  Abdomen is soft.  Bowel sounds are present.  She has no fluid wave.  There is no palpable liver or spleen tip.  Extremity shows no clubbing, cyanosis or edema.  Neurological exam shows no focal neurological deficits.  Again, we have completed the first cycle of our protocol.  We will now have to watch out for methotrexate toxicity.  Will start the leucovorin rescue.  I know pharmacy is very good about helping [place] out with this.  We need to check daily methotrexate labs.  We can start that on Saturday.  We have to be careful with nausea and vomiting.  Hopefully,  scheduled antiemetics will help.  I would anticipate that if all goes okay, that we would be able to think about getting her home on may be Tuesday or Wednesday.  I do appreciate the incredible care that she is getting from everybody on 6 E.  [name], MD  Psalms 138:8

## 2022-03-24 NOTE — Progress Notes (Signed)
PT Cancellation Note  Patient Details Name: Ariel [name] MRN: [id number] DOB: 1955/08/15   Cancelled Treatment:    Reason Eval/Treat Not Completed: Medical issues which prohibited therapy (PT nauseated and weak)   Lelon Mast 03/24/2022, 11:09 AM

## 2022-03-24 NOTE — Progress Notes (Signed)
Initial Nutrition Assessment  DOCUMENTATION CODES:   Non-severe (moderate) malnutrition in context of chronic illness  INTERVENTION:   -Ensure Plus High Protein po BID, each supplement provides 350 kcal and 20 grams of protein.   -Multivitamin with minerals daily  -Magic cup BID with meals, each supplement provides 290 kcal and 9 grams of protein   -Placed patient on meal order with assist  NUTRITION DIAGNOSIS:   Moderate Malnutrition related to chronic illness, cancer and cancer related treatments as evidenced by mild fat depletion, mild muscle depletion.  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  CT of the head revealed increased edema in the area for left frontal lobe lesion with no acute hemorrhage or evidence of CVA.  MRI of the brain showed progression of lymphoma in the left frontal operculum.  Patient lying in bed. Pt states  her appetite has been low today d/t having nausea. States she last felt nausea on 1/21. Has been mainly consuming liquids and drinking some Ensures. Drinks Ensure at home.  Pt earlier today had called the RD office thinking it was the kitchen to place her lunch order. RD placed lunch order and placed pt on meal order with assist. Pt ate a little bit of her lunch today. Mainly ordered a baked potato with some vanilla pudding. Pt likes ice cream so will order Magic cups.  Per weight records, weight has remained stable at this time.    Medications: Pepcid, Emend, Zofran  Labs reviewed: Low Na   NUTRITION - FOCUSED PHYSICAL  EXAM:  Flowsheet Row Most Recent Value  Orbital Region Mild depletion  Upper Arm Region Moderate depletion  Thoracic and Lumbar Region Unable to assess  Buccal Region Mild depletion  Temple Region Mild depletion  Clavicle Bone Region Mild depletion  Clavicle and Acromion Bone Region Mild depletion  Scapular Bone Region Mild depletion  Dorsal Hand Moderate depletion  Patellar Region Unable to assess  Anterior Thigh Region Unable to assess  Posterior Calf Region Unable to assess  Edema (RD Assessment) None  Hair Reviewed  Eyes Reviewed  Mouth Reviewed  Skin Reviewed       Diet Order:   Diet Order             Diet regular Room service appropriate? Yes; Fluid consistency: Thin  Diet effective now                   EDUCATION NEEDS:   No education needs have been identified at this time  Skin:  Skin Assessment: Reviewed RN Assessment  Last BM:  1/25 -type 6  Height:   Ht Readings from Last 1 Encounters:  03/17/22 '5\' 7"'$  (1.702 m)    Weight:   Wt Readings from Last 1 Encounters:  03/17/22 69 kg    BMI:  Body mass index is 23.82 kg/m.  Estimated Nutritional Needs:   Kcal:  2050-2250  Protein:  100-110g  Fluid:  2.2L/day  Clayton Bibles, MS, RD, LDN Inpatient Clinical Dietitian Contact information available via Amion

## 2022-03-25 ENCOUNTER — Encounter (HOSPITAL_COMMUNITY): Payer: Self-pay | Admitting: Hematology & Oncology

## 2022-03-25 DIAGNOSIS — C8330 Diffuse large B-cell lymphoma, unspecified site: Secondary | ICD-10-CM | POA: Diagnosis not present

## 2022-03-25 DIAGNOSIS — C8589 Other specified types of non-Hodgkin lymphoma, extranodal and solid organ sites: Secondary | ICD-10-CM | POA: Diagnosis not present

## 2022-03-25 DIAGNOSIS — B370 Candidal stomatitis: Secondary | ICD-10-CM | POA: Diagnosis not present

## 2022-03-25 DIAGNOSIS — N390 Urinary tract infection, site not specified: Secondary | ICD-10-CM | POA: Diagnosis not present

## 2022-03-25 DIAGNOSIS — R945 Abnormal results of liver function studies: Secondary | ICD-10-CM | POA: Diagnosis not present

## 2022-03-25 LAB — CBC WITH DIFFERENTIAL/PLATELET
Abs Immature Granulocytes: 0.06 10*3/uL (ref 0.00–0.07)
Basophils Absolute: 0 10*3/uL (ref 0.0–0.1)
Basophils Relative: 0 %
Eosinophils Absolute: 0 10*3/uL (ref 0.0–0.5)
Eosinophils Relative: 0 %
HCT: 32.1 % — ABNORMAL LOW (ref 36.0–46.0)
Hemoglobin: 11.4 g/dL — ABNORMAL LOW (ref 12.0–15.0)
Immature Granulocytes: 1 %
Lymphocytes Relative: 6 %
Lymphs Abs: 0.5 10*3/uL — ABNORMAL LOW (ref 0.7–4.0)
MCH: 32 pg (ref 26.0–34.0)
MCHC: 35.5 g/dL (ref 30.0–36.0)
MCV: 90.2 fL (ref 80.0–100.0)
Monocytes Absolute: 0.3 10*3/uL (ref 0.1–1.0)
Monocytes Relative: 4 %
Neutro Abs: 7.4 10*3/uL (ref 1.7–7.7)
Neutrophils Relative %: 89 %
Platelets: 100 10*3/uL — ABNORMAL LOW (ref 150–400)
RBC: 3.56 MIL/uL — ABNORMAL LOW (ref 3.87–5.11)
RDW: 12 % (ref 11.5–15.5)
WBC: 8.2 10*3/uL (ref 4.0–10.5)
nRBC: 0 % (ref 0.0–0.2)

## 2022-03-25 LAB — COMPREHENSIVE METABOLIC PANEL
ALT: 165 U/L — ABNORMAL HIGH (ref 0–44)
AST: 68 U/L — ABNORMAL HIGH (ref 15–41)
Albumin: 2.7 g/dL — ABNORMAL LOW (ref 3.5–5.0)
Alkaline Phosphatase: 66 U/L (ref 38–126)
Anion gap: 10 (ref 5–15)
BUN: 21 mg/dL (ref 8–23)
CO2: 28 mmol/L (ref 22–32)
Calcium: 9.1 mg/dL (ref 8.9–10.3)
Chloride: 93 mmol/L — ABNORMAL LOW (ref 98–111)
Creatinine, Ser: 0.63 mg/dL (ref 0.44–1.00)
GFR, Estimated: >60 mL/min (ref >60)
Glucose, Bld: 141 mg/dL — ABNORMAL HIGH (ref 70–99)
Potassium: 3.6 mmol/L (ref 3.5–5.1)
Sodium: 131 mmol/L — ABNORMAL LOW (ref 135–145)
Total Bilirubin: 0.4 mg/dL (ref 0.3–1.2)
Total Protein: 5.8 g/dL — ABNORMAL LOW (ref 6.5–8.1)

## 2022-03-25 LAB — URINALYSIS, COMPLETE (UACMP) WITH MICROSCOPIC
Bilirubin Urine: NEGATIVE
Glucose, UA: 50 mg/dL — AB
Hgb urine dipstick: NEGATIVE
Ketones, ur: NEGATIVE mg/dL
Leukocytes,Ua: NEGATIVE
Nitrite: NEGATIVE
Protein, ur: NEGATIVE mg/dL
Specific Gravity, Urine: 1.009 (ref 1.005–1.030)
pH: 8 (ref 5.0–8.0)

## 2022-03-25 LAB — PHOSPHORUS: Phosphorus: 2.6 mg/dL (ref 2.5–4.6)

## 2022-03-25 LAB — GLUCOSE, CAPILLARY
Glucose-Capillary: 122 mg/dL — ABNORMAL HIGH (ref 70–99)
Glucose-Capillary: 179 mg/dL — ABNORMAL HIGH (ref 70–99)

## 2022-03-25 LAB — MAGNESIUM: Magnesium: 2.5 mg/dL — ABNORMAL HIGH (ref 1.7–2.4)

## 2022-03-25 MED ORDER — DEXAMETHASONE SODIUM PHOSPHATE 4 MG/ML IJ SOLN
4.0000 mg | Freq: Every day | INTRAMUSCULAR | Status: DC
  Administered 2022-03-25 – 2022-04-02 (×9): 4 mg via INTRAVENOUS
  Filled 2022-03-25 (×9): qty 1

## 2022-03-25 MED ORDER — FLUCONAZOLE 100 MG PO TABS
200.0000 mg | ORAL_TABLET | Freq: Every day | ORAL | Status: DC
  Administered 2022-03-25 – 2022-03-26 (×2): 200 mg via ORAL
  Filled 2022-03-25 (×2): qty 2

## 2022-03-25 MED ORDER — LEUCOVORIN CALCIUM INJECTION 100 MG: 30.0000 mg/m2 | Freq: Four times a day (QID) | INTRAMUSCULAR | Status: DC

## 2022-03-25 MED ORDER — PHENOL 1.4 % MT LIQD
1.0000 | OROMUCOSAL | Status: DC | PRN
  Filled 2022-03-25: qty 177

## 2022-03-25 MED ORDER — LEUCOVORIN CALCIUM INJECTION 100 MG
30.0000 mg/m2 | Freq: Four times a day (QID) | INTRAMUSCULAR | Status: AC
  Administered 2022-03-25 – 2022-03-26 (×4): 54 mg via INTRAVENOUS
  Filled 2022-03-25 (×5): qty 2.7

## 2022-03-25 MED ORDER — FAMCICLOVIR 500 MG PO TABS
500.0000 mg | ORAL_TABLET | Freq: Every day | ORAL | Status: DC
  Administered 2022-03-25 – 2022-04-14 (×21): 500 mg via ORAL
  Filled 2022-03-25 (×21): qty 1

## 2022-03-25 NOTE — Progress Notes (Signed)
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  CT of the head revealed increased edema in the area for left frontal lobe lesion with no acute hemorrhage or evidence of CVA.  MRI of the brain showed progression of lymphoma in the left frontal operculum.  Oncology following, started in house chemotherapy.  Discharge plan as per oncology.  Assessment & Plan:  Active Problems:   Hyperlipidemia   Melanoma ([place])   High grade B-cell lymphoma ([facility])   Diffuse large B cell lymphoma ([facility])   CNS lymphoma ([place])   Aphasia   Palliative care by specialist   High-grade non-Hodgkin's lymphoma with brain involvement: Follows with Dr. Marin Olp.  Also follows with radiation oncology, neuro-oncology.  MRI finding as above.  EEG without any evidence of epileptiform discharge.  Started on chemotherapy in 1/23,completed first cycle.Started on leucovorin, famciclovir, Diflucan Nausea,better today  Fever/UTI: Currently afebrile.    Urine culture showed pansensitive E. coli.  Currently on keflex  Aphasia with right-sided weakness: due to left frontal lobe CNS lymphoma lesion with worsening edema.  On high-dose steroids.  Continue chemotherapy  Hyponatremia: Mild.  Suspected to be from sodium bicarb drip with dextrose.  Continue monitoring.  Hypokalemia: Currently being monitored and supplemented as needed  Hypophosphatemia: Supplemented and corrected  Elevated liver enzymes: Most likely secondary to chemotherapy.  Continue to monitor.  Also has mild thrombocytopenia which could be associated with malignancy  Generalized weakness: PT/OT consulted   Nutrition Problem: Moderate  Malnutrition Etiology: chronic illness, cancer and cancer related treatments    DVT prophylaxis:enoxaparin (LOVENOX) injection 40 mg Start: 03/20/22 1700     Code Status: Full Code  Family Communication: None at bedside.Patient says no need to call anybody  Patient status:Inpatient  Patient is from :Home  Anticipated discharge LT:JQZE  Estimated DC date:after oncology clearance   Consultants: Oncology  Procedures:None  Antimicrobials:  Anti-infectives (From admission, onward)    Start     Dose/Rate Route Frequency Ordered Stop   03/25/22 1000  fluconazole (DIFLUCAN) tablet 200 mg        200 mg Oral Daily 03/25/22 0649     03/25/22 1000  famciclovir (FAMVIR) tablet 500 mg        500 mg Oral Daily 03/25/22 0649     03/23/22 1000  cephALEXin (KEFLEX) capsule 500 mg        500 mg Oral Every 12 hours 03/22/22 1056     03/22/22 0800  ciprofloxacin (CIPRO) tablet 500 mg  Status:  Discontinued        500 mg Oral 2 times daily 03/22/22 0635 03/22/22 0703   03/22/22 0800  cefTRIAXone (ROCEPHIN) 1 g in sodium chloride 0.9 % 100 mL IVPB  Status:  Discontinued        1 g 200 mL/hr over 30 Minutes Intravenous Every 24 hours 03/22/22 0703 03/22/22 1056       Subjective: Patient seen and examined at bedside today.  Hemodynamically stable.  Comfortable without any complaints.  Nausea is better today.  Objective: Vitals:   03/24/22 0743 03/24/22 1313 03/24/22 2230 03/25/22 0542  BP: 116/65 111/63 122/64 132/67  Pulse: 69 69 61 76  Resp: '18 18 17 15  '$ Temp: 98.5 F (  36.9 C) 97.9 F (36.6 C) 98.2 F (36.8 C) 98.4 F (36.9 C)  TempSrc: Oral Oral Oral Oral  SpO2: 98% 99% 97% 98%  Weight:      Height:        Intake/Output Summary (Last 24 hours) at 03/25/2022 1127 Last data filed at 03/25/2022 0600 Gross per 24 hour  Intake 1196.19 ml  Output 1600 ml  Net -403.81 ml   Filed Weights   03/17/22 1000 03/17/22 1823  Weight: 69 kg 69 kg    Examination:  General exam:  Overall comfortable, not in distress,weak appearing HEENT: PERRL Respiratory system:  no wheezes or crackles  Cardiovascular system: S1 & S2 heard, RRR.  Gastrointestinal system: Abdomen is nondistended, soft and nontender. Central nervous system: Alert and oriented Extremities: No edema, no clubbing ,no cyanosis Skin: No rashes, no ulcers,no icterus      Data Reviewed: I have personally reviewed following labs and imaging studies  CBC: Recent Labs  Lab 03/21/22 0532 03/22/22 0500 03/23/22 0543 03/24/22 0610 03/25/22 0530  WBC 6.9 6.3 7.1 8.3 8.2  NEUTROABS 5.3 4.5 5.4 6.9 7.4  HGB 11.3* 11.6* 12.1 11.9* 11.4*  HCT 31.7* 33.6* 34.4* 32.5* 32.1*  MCV 92.4 93.6 92.0 88.6 90.2  PLT 119* 111* 114* 108* 124*   Basic Metabolic Panel: Recent Labs  Lab 03/21/22 0532 03/22/22 0500 03/23/22 0543 03/24/22 0610 03/25/22 0530  NA 129* 129* 130* 129* 131*  K 3.4* 4.1 3.8 3.6 3.6  CL 88* 94* 93* 91* 93*  CO2 '29 25 27 27 28  '$ GLUCOSE 133* 125* 158* 126* 141*  BUN '12 14 22 22 21  '$ CREATININE 0.56 0.61 0.63 0.57 0.63  CALCIUM 9.0 9.2 9.0 8.9 9.1  MG 2.2 2.0 2.0 2.2 2.5*  PHOS 3.7 2.3* 2.7 3.0 2.6     Recent Results (from the past 240 hour(s))  Resp panel by RT-PCR (RSV, Flu A&B, Covid) Anterior Nasal Swab     Status: None   Collection Time: 03/17/22 11:21 AM   Specimen: Anterior Nasal Swab  Result Value Ref Range Status   SARS Coronavirus 2 by RT PCR NEGATIVE NEGATIVE Final    Comment: (NOTE) SARS-CoV-2 target nucleic acids are NOT DETECTED.  The SARS-CoV-2 RNA is generally detectable in upper respiratory specimens during the acute phase of infection. The lowest concentration of SARS-CoV-2 viral copies this assay can detect is 138 copies/mL. A negative result does not preclude SARS-Cov-2 infection and should not be used as the sole basis for treatment or other patient management decisions. A negative result may occur with  improper specimen collection/handling, submission  of specimen other than nasopharyngeal swab, presence of viral mutation(s) within the areas targeted by this assay, and inadequate number of viral copies(<138 copies/mL). A negative result must be combined with clinical observations, patient history, and epidemiological information. The expected result is Negative.  Fact Sheet for Patients:  EntrepreneurPulse.com.au  Fact Sheet for Healthcare Providers:  IncredibleEmployment.be  This test is no t yet approved or cleared by the [place] FDA and  has been authorized for detection and/or diagnosis of SARS-CoV-2 by FDA under an Emergency Use Authorization (EUA). This EUA will remain  in effect (meaning this test can be used) for the duration of the COVID-19 declaration under Section 564(b)(1) of the Act, 21 U.S.C.section 360bbb-3(b)(1), unless the authorization is terminated  or revoked sooner.       Influenza A by PCR NEGATIVE NEGATIVE Final   Influenza B by PCR NEGATIVE NEGATIVE Final  Comment: (NOTE) The Xpert Xpress SARS-CoV-2/FLU/RSV plus assay is intended as an aid in the diagnosis of influenza from Nasopharyngeal swab specimens and should not be used as a sole basis for treatment. Nasal washings and aspirates are unacceptable for Xpert Xpress SARS-CoV-2/FLU/RSV testing.  Fact Sheet for Patients: EntrepreneurPulse.com.au  Fact Sheet for Healthcare Providers: IncredibleEmployment.be  This test is not yet approved or cleared by the [place] FDA and has been authorized for detection and/or diagnosis of SARS-CoV-2 by FDA under an Emergency Use Authorization (EUA). This EUA will remain in effect (meaning this test can be used) for the duration of the COVID-19 declaration under Section 564(b)(1) of the Act, 21 U.S.C. section 360bbb-3(b)(1), unless the authorization is terminated or revoked.     Resp Syncytial Virus by PCR NEGATIVE NEGATIVE  Final    Comment: (NOTE) Fact Sheet for Patients: EntrepreneurPulse.com.au  Fact Sheet for Healthcare Providers: IncredibleEmployment.be  This test is not yet approved or cleared by the [place] FDA and has been authorized for detection and/or diagnosis of SARS-CoV-2 by FDA under an Emergency Use Authorization (EUA). This EUA will remain in effect (meaning this test can be used) for the duration of the COVID-19 declaration under Section 564(b)(1) of the Act, 21 U.S.C. section 360bbb-3(b)(1), unless the authorization is terminated or revoked.  Performed at [facility], [place] [address] [place], [place] [address]   Urine Culture     Status: Abnormal   Collection Time: 03/20/22  7:05 AM   Specimen: Urine, Catheterized  Result Value Ref Range Status   Specimen Description   Final    URINE, CATHETERIZED Performed at [facility] [address] [place], [place] [address]    Special Requests   Final    Immunocompromised Performed at [facility], [place] [address], [place], [place] [address]    Culture >=100,000 COLONIES/mL ESCHERICHIA COLI (A)  Final   Report Status 03/22/2022 FINAL  Final   Organism ID, Bacteria ESCHERICHIA COLI (A)  Final      Susceptibility   Escherichia coli - MIC*    AMPICILLIN <=2 SENSITIVE Sensitive     CEFAZOLIN <=4 SENSITIVE Sensitive     CEFEPIME <=0.12 SENSITIVE Sensitive     CEFTRIAXONE <=0.25 SENSITIVE Sensitive     CIPROFLOXACIN <=0.25 SENSITIVE Sensitive     GENTAMICIN <=1 SENSITIVE Sensitive     IMIPENEM <=0.25 SENSITIVE Sensitive     NITROFURANTOIN <=16 SENSITIVE Sensitive     TRIMETH/SULFA <=20 SENSITIVE Sensitive     AMPICILLIN/SULBACTAM <=2 SENSITIVE Sensitive     PIP/TAZO <=4 SENSITIVE Sensitive     * >=100,000 COLONIES/mL ESCHERICHIA COLI     Radiology Studies: No results found.  Scheduled Meds:  antiseptic oral rinse  15 mL Mouth Rinse Q6H    cephALEXin  500 mg Oral Q12H   Chlorhexidine Gluconate Cloth  6 each Topical Daily   dexamethasone (DECADRON) injection  4 mg Intravenous Daily   enoxaparin (LOVENOX) injection  40 mg Subcutaneous Q24H   famciclovir  500 mg Oral Daily   famotidine  20 mg Oral Daily   feeding supplement  237 mL Oral TID BM   fluconazole  200 mg Oral Daily   [START ON 03/26/2022] leucovorin  30 mg/m2 (Treatment Plan Recorded) Intravenous Q6H   multivitamin with minerals  1 tablet Oral Daily   naphazoline-glycerin  2 drop Both Eyes Q8H   OLANZapine  5 mg Oral QHS   sodium bicarbonate/sodium chloride   Mouth Rinse Q4H  sodium chloride flush  10-40 mL Intracatheter Q12H   Continuous Infusions:  ondansetron 8 mg (03/25/22 0518)   sodium bicarbonate 150 mEq in dextrose 5 % 1,150 mL infusion 50 mL/hr at 03/25/22 0058     LOS: 8 days   Shelly Coss, MD [facility] P1/27/2024, 11:27 AM

## 2022-03-25 NOTE — Progress Notes (Signed)
Ariel [name] looks much better today.  I am so happy for her.  She really has done nicely with the side effects of treatment.  We will have to see what the methotrexate level is today.  She does have a lot of thrush in the mouth.  We will have to get her on some Diflucan for this.  I also probably will get her on some Famvir now.  She is eating a bit better.  The nausea is much less.  I do appreciate dietary help in this.  Her albumin is on the lower side.  She has had no problems with diarrhea.  There is no cough or shortness of breath.  She has had no fever.  There is no headache.  Her labs show sodium 131.  Potassium 3.6.  BUN 21 creatinine 0.63.  Her magnesium is 2.5.  Her SGPT 165 SGOT 68.  These are always elevated when she gets the methotrexate.  Her white cell count is 8.2.  Hemoglobin 11.4.  Platelet count 100,000.  Her vital signs are temperature 98.4.  Pulse 76.  Blood pressure 132/67.  Her head neck exam shows oral thrush.  She has good extraocular muscle movement.  Pupils react appropriately.  Lungs are clear bilaterally.  Cardiac exam regular rate and rhythm.  Abdomen is soft.  Bowel sounds are present.  She has no fluid wave.  Extremity shows no clubbing, cyanosis or edema.  She has good strength in upper and lower extremities.  Neurological exam is nonfocal.  Ariel [name] has completed her first cycle of MTV.  She is doing well.  We will have to see what the methotrexate levels are.  Typically, she close out the methotrexate fairly quickly.  She is on the leucovorin rescue right now.  We will give her some Diflucan for her thrush.  I would suspect that we probably will be looking at Tuesday at the earliest for him to be able to go home.  I do appreciate everybody working with her to try to help her performance status improved so she will be able to go home.  [name], MD  Romans 8:28

## 2022-03-25 NOTE — Progress Notes (Signed)
Methotrexate Level Update Note  Lab: MTX level drawn at 0530 this AM was 1.18  A/P: MTX infusion finished at 1947 on 1/25 and MTX level drawn 1/27 at 0530, which is 33.5hr after infusion finished. Per Leucovorin dose nomogram, dose of Leucovorin should stay at current q6h.  Adrian Saran, PharmD, BCPS Secure Chat if ?s 03/25/2022 12:58 PM

## 2022-03-26 ENCOUNTER — Encounter (HOSPITAL_COMMUNITY): Payer: Self-pay | Admitting: Hematology & Oncology

## 2022-03-26 DIAGNOSIS — N390 Urinary tract infection, site not specified: Secondary | ICD-10-CM | POA: Diagnosis not present

## 2022-03-26 DIAGNOSIS — C8589 Other specified types of non-Hodgkin lymphoma, extranodal and solid organ sites: Secondary | ICD-10-CM | POA: Diagnosis not present

## 2022-03-26 DIAGNOSIS — C8330 Diffuse large B-cell lymphoma, unspecified site: Secondary | ICD-10-CM | POA: Diagnosis not present

## 2022-03-26 DIAGNOSIS — Z79899 Other long term (current) drug therapy: Secondary | ICD-10-CM | POA: Diagnosis not present

## 2022-03-26 DIAGNOSIS — R5383 Other fatigue: Secondary | ICD-10-CM | POA: Diagnosis not present

## 2022-03-26 LAB — CBC WITH DIFFERENTIAL/PLATELET
Abs Immature Granulocytes: 0.04 10*3/uL (ref 0.00–0.07)
Basophils Absolute: 0 10*3/uL (ref 0.0–0.1)
Basophils Relative: 0 %
Eosinophils Absolute: 0 10*3/uL (ref 0.0–0.5)
Eosinophils Relative: 0 %
HCT: 33 % — ABNORMAL LOW (ref 36.0–46.0)
Hemoglobin: 11.6 g/dL — ABNORMAL LOW (ref 12.0–15.0)
Immature Granulocytes: 1 %
Lymphocytes Relative: 5 %
Lymphs Abs: 0.4 10*3/uL — ABNORMAL LOW (ref 0.7–4.0)
MCH: 32.1 pg (ref 26.0–34.0)
MCHC: 35.2 g/dL (ref 30.0–36.0)
MCV: 91.4 fL (ref 80.0–100.0)
Monocytes Absolute: 0 10*3/uL — ABNORMAL LOW (ref 0.1–1.0)
Monocytes Relative: 0 %
Neutro Abs: 7.1 10*3/uL (ref 1.7–7.7)
Neutrophils Relative %: 94 %
Platelets: 102 10*3/uL — ABNORMAL LOW (ref 150–400)
RBC: 3.61 MIL/uL — ABNORMAL LOW (ref 3.87–5.11)
RDW: 12 % (ref 11.5–15.5)
WBC: 7.6 10*3/uL (ref 4.0–10.5)
nRBC: 0 % (ref 0.0–0.2)

## 2022-03-26 LAB — GLUCOSE, CAPILLARY
Glucose-Capillary: 98 mg/dL (ref 70–99)
Glucose-Capillary: 204 mg/dL — ABNORMAL HIGH (ref 70–99)

## 2022-03-26 LAB — COMPREHENSIVE METABOLIC PANEL
ALT: 125 U/L — ABNORMAL HIGH (ref 0–44)
AST: 46 U/L — ABNORMAL HIGH (ref 15–41)
Albumin: 2.9 g/dL — ABNORMAL LOW (ref 3.5–5.0)
Alkaline Phosphatase: 57 U/L (ref 38–126)
Anion gap: 11 (ref 5–15)
BUN: 22 mg/dL (ref 8–23)
CO2: 29 mmol/L (ref 22–32)
Calcium: 9 mg/dL (ref 8.9–10.3)
Chloride: 93 mmol/L — ABNORMAL LOW (ref 98–111)
Creatinine, Ser: 0.79 mg/dL (ref 0.44–1.00)
GFR, Estimated: >60 mL/min (ref >60)
Glucose, Bld: 130 mg/dL — ABNORMAL HIGH (ref 70–99)
Potassium: 3.5 mmol/L (ref 3.5–5.1)
Sodium: 133 mmol/L — ABNORMAL LOW (ref 135–145)
Total Bilirubin: 0.4 mg/dL (ref 0.3–1.2)
Total Protein: 5.6 g/dL — ABNORMAL LOW (ref 6.5–8.1)

## 2022-03-26 LAB — URINALYSIS, COMPLETE (UACMP) WITH MICROSCOPIC
Bacteria, UA: NONE SEEN
Bilirubin Urine: NEGATIVE
Glucose, UA: NEGATIVE mg/dL
Hgb urine dipstick: NEGATIVE
Ketones, ur: NEGATIVE mg/dL
Leukocytes,Ua: NEGATIVE
Nitrite: NEGATIVE
Protein, ur: NEGATIVE mg/dL
Specific Gravity, Urine: 1.008 (ref 1.005–1.030)
pH: 9 — ABNORMAL HIGH (ref 5.0–8.0)

## 2022-03-26 LAB — MAGNESIUM: Magnesium: 2.4 mg/dL (ref 1.7–2.4)

## 2022-03-26 LAB — PHOSPHORUS: Phosphorus: 2.6 mg/dL (ref 2.5–4.6)

## 2022-03-26 MED ORDER — POTASSIUM CHLORIDE CRYS ER 20 MEQ PO TBCR
40.0000 meq | EXTENDED_RELEASE_TABLET | Freq: Once | ORAL | Status: AC
  Administered 2022-03-26: 40 meq via ORAL
  Filled 2022-03-26: qty 2

## 2022-03-26 MED ORDER — LEUCOVORIN CALCIUM INJECTION 100 MG: 15.0000 mg/m2 | Freq: Four times a day (QID) | INTRAMUSCULAR | Status: DC

## 2022-03-26 MED ORDER — LEUCOVORIN CALCIUM INJECTION 100 MG
15.0000 mg/m2 | Freq: Four times a day (QID) | INTRAMUSCULAR | Status: AC
  Administered 2022-03-26 – 2022-03-27 (×4): 28 mg via INTRAVENOUS
  Filled 2022-03-26 (×6): qty 1.4

## 2022-03-26 NOTE — Progress Notes (Signed)
Ariel [name]   DOB:06/17/1955   RW#:[id number]    ASSESSMENT & PLAN:  Recurrent CNS lymphoma So far, she tolerated treatment well with expected side effects Continue supportive care Methotrexate level is pending, continue leucovorin rescue per protocol Appreciate pharmacist assistance in this  Mucositis due to chemotherapy The patient declined Magic mouthwash  Elevated liver enzymes This is stable/improved Monitor closely  Intermittent nausea She has antiemetics to take as needed  Mild acquired pancytopenia She is not symptomatic Observe  Discharge planning Will defer to primary service Her oncologist will resume care tomorrow  All questions were answered. The patient knows to call the clinic with any problems, questions or concerns.   The total time spent in the appointment was 30 minutes encounter with patients including review of chart and various tests results, discussions about plan of care and coordination of care plan  Heath Lark, MD 68/28/2024 9:05 AM  Subjective:  I am covering for her primary oncologist this weekend.  She has received high-dose methotrexate recently for recurrent CNS lymphoma She felt fine except for mucositis and nausea which are not new and stable according to patient  Objective:  Vitals:   03/25/22 1928 03/26/22 0521  BP: (!) 127/58 (!) 120/57  Pulse: 64 67  Resp: 18 14  Temp: (!) 97.5 F (36.4 C) 98.1 F (36.7 C)  SpO2: 97% 98%     Intake/Output Summary (Last 24 hours) at 03/26/2022 0905 Last data filed at 03/26/2022 0600 Gross per 24 hour  Intake 480 ml  Output 2500 ml  Net -2020 ml    GENERAL:alert, no distress and comfortable OROPHARYNX:no exudate, no erythema and lips, buccal mucosa, and tongue normal.  No evidence of thrush NEURO: alert & oriented x 3 with fluent speech, no focal motor/sensory deficits   Labs:  Recent Labs    03/24/22 0610 03/25/22 0530 03/26/22 0520  NA 129* 131* 133*  K 3.6 3.6 3.5  CL 91* 93* 93*   CO2 '27 28 29  '$ GLUCOSE 126* 141* 130*  BUN '22 21 22  '$ CREATININE 0.57 0.63 0.79  CALCIUM 8.9 9.1 9.0  GFRNONAA >60 >60 >60  PROT 6.2* 5.8* 5.6*  ALBUMIN 3.2* 2.7* 2.9*  AST 76* 68* 46*  ALT 147* 165* 125*  ALKPHOS 71 66 57  BILITOT 0.6 0.4 0.4    Studies:  MR BRAIN W WO CONTRAST  Result Date: 03/17/2022 CLINICAL DATA:  Neuro deficit, acute, stroke suspected EXAM: MRI HEAD WITHOUT AND WITH CONTRAST TECHNIQUE: Multiplanar, multiecho pulse sequences of the brain and surrounding structures were obtained without and with intravenous contrast. CONTRAST:  56m GADAVIST GADOBUTROL 1 MMOL/ML IV SOLN COMPARISON:  MRI 02/13/2022. FINDINGS: Brain: Progressive edema, enhancement and restricted diffusion in the left frontal operculum, which remains patchy multifocal and is compatible with progressive disease. Left cerebellar peduncle edema and enhancement appears mildly improved. No pathologic enhancement elsewhere. No evidence of midline shift or hydrocephalus. Similar encephalomalacia in the left temporal lobe and inferior left cerebellum. Vascular: Major arterial flow voids are maintained at the skull base. Skull and upper cervical spine: Normal marrow signal. Sinuses/Orbits: Negative. Other: No mastoid effusions. IMPRESSION: Mixed response with evidence of progressive disease in the left frontal operculum and mild improvement in the left cerebellar peduncle, as above. Both areas continue to enhance. Electronically Signed   By: FMargaretha SheffieldM.D.   On: 03/17/2022 18:34   EEG adult  Result Date: 03/17/2022 YLora Havens MD     03/18/2022  8:01 AM Patient Name: Ariel [name] MRN: [id number] Epilepsy Attending: Lora Havens Referring Physician/Provider: Amie Portland, MD Date: 03/17/2022 Duration: 68.08 mins Patient history: 67yo F with aphasia. EEG to evaluate for seizure. Level of alertness: Awake AEDs during EEG study: None Technical aspects: This EEG study was done with scalp electrodes positioned  according to the 10-20 International system of electrode placement. Electrical activity was reviewed with band pass filter of 1-'70Hz'$ , sensitivity of 7 uV/mm, display speed of 60m/sec with a '60Hz'$  notched filter applied as appropriate. EEG data were recorded continuously and digitally stored.  Video monitoring was available and reviewed as appropriate. Description: The posterior dominant rhythm consists of 8-9 Hz activity of moderate voltage (25-35 uV) seen predominantly in posterior head regions, symmetric and reactive to eye opening and eye closing. EEG showed intermittent 3 to 5 Hz theta-delta slowing in left frontal region.  Hyperventilation and photic stimulation were not performed.   ABNORMALITY - Intermittent slow, left frontal region IMPRESSION: This study is suggestive of cortical dysfunction arising from left frontal region likely secondary to underlying structural abnormality. No seizures or epileptiform discharges were seen throughout the recording. Please note lack of epileptiform activity during interictal EEG does not exclude the diagnosis of epilepsy. PLora Havens  DG Chest Portable 1 View  Result Date: 03/17/2022 CLINICAL DATA:  Fever. EXAM: PORTABLE CHEST 1 VIEW COMPARISON:  12/19/2021. FINDINGS: Unchanged right chest Port-A-Cath with tip projecting over the cavoatrial junction. Clear lungs. Normal heart size and mediastinal contours. No pleural effusion or pneumothorax. Visualized bones and upper abdomen are unremarkable. IMPRESSION: No acute cardiopulmonary disease. Electronically Signed   By: WEmmit AlexandersM.D.   On: 03/17/2022 11:52   CT HEAD CODE STROKE WO CONTRAST  Result Date: 03/17/2022 CLINICAL DATA:  Code stroke.  Right-sided facial droop and aphasia EXAM: CT HEAD WITHOUT CONTRAST TECHNIQUE: Contiguous axial images were obtained from the base of the skull through the vertex without intravenous contrast. RADIATION DOSE REDUCTION: This exam was performed according to the  departmental dose-optimization program which includes automated exposure control, adjustment of the mA and/or kV according to patient size and/or use of iterative reconstruction technique. COMPARISON:  02/20/2022 FINDINGS: Brain: Increased but fairly mild edema around the high-density lesion at the left frontal cortex, recurrent CNS lymphoma by most recent brain MRI. No CT correlate for enhancing disease at the left deep cerebellum. No evidence of superimposed acute infarct, hemorrhage, or hydrocephalus. Encephalomalacia in the posterior left temporal cortex which is stable. Vascular: No hyperdense vessel or unexpected calcification. Skull: Normal. Negative for fracture or focal lesion. Sinuses/Orbits: No acute finding. Other: Prelim sent in epic chat IMPRESSION: 1. Increased edema at the left frontal lesion. A deep left cerebellar lesion on prior brain MRI is largely occult by CT. 2. No acute hemorrhage or visible infarct. 3. Stable left temporal lobe encephalomalacia. Electronically Signed   By: JJorje GuildM.D.   On: 03/17/2022 10:44   NM PET Image Restag (PS) Skull Base To Thigh  Result Date: 03/12/2022 CLINICAL DATA:  Subsequent treatment strategy for high-grade B-cell lymphoma. EXAM: NUCLEAR MEDICINE PET SKULL BASE TO THIGH TECHNIQUE: 7.23 mCi F-18 FDG was injected intravenously. Full-ring PET imaging was performed from the skull base to thigh after the radiotracer. CT data was obtained and used for attenuation correction and anatomic localization. Fasting blood glucose: 120 mg/dl COMPARISON:  Multiple priors including most recent CT February 21, 2022 and PET-CT September 28, 2021 FINDINGS: Mediastinal blood pool activity: SUV max 2.0 Liver activity: SUV max 3.4 NECK: No  hypermetabolic cervical adenopathy. Low-level metabolic activity within a fluid level in the right maxillary sinus with a max SUV of 3.6. Incidental CT findings: None. CHEST: Multifocal and confluence ground-glass opacities in the right  lower lobe are hypermetabolic with a max SUV of 10.5. No hypermetabolic thoracic adenopathy. Incidental CT findings: Right chest Port-A-Cath with tip in the right atrium. ABDOMEN/PELVIS: No abnormal hypermetabolic activity within the liver, pancreas, or spleen. No hypermetabolic lymph nodes in the abdomen or pelvis. Minimally metabolic hypodense 12 mm right adrenal nodule measures Hounsfield units of 3 with max SUV 2.6 compatible with a benign adrenal adenoma. Incidental CT findings: Aortic atherosclerosis. SKELETON: No focal hypermetabolic activity to suggest skeletal metastasis. Incidental CT findings: None. IMPRESSION: 1. Multifocal and confluence ground-glass opacities in the right lower lobe have increased in comparison to chest CT February 21, 2022 and are hypermetabolic, nonspecific but favored to reflect an infectious or inflammatory process. Suggest attention on short-term interval follow-up chest CT in 3 months to ensure resolution. (Deauville X) 2. No hypermetabolic adenopathy above or below the diaphragm. No splenomegaly. 3. Low-level metabolic activity within a fluid level in the right maxillary sinus may reflect acute sinusitis. 4. Benign right adrenal adenoma. 5.  Aortic Atherosclerosis (ICD10-I70.0). Electronically Signed   By: Dahlia Bailiff M.D.   On: 03/12/2022 08:52

## 2022-03-26 NOTE — Progress Notes (Signed)
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  CT of the head revealed increased edema in the area for left frontal lobe lesion with no acute hemorrhage or evidence of CVA.  MRI of the brain showed progression of lymphoma in the left frontal operculum.  Oncology following, started in house chemotherapy.  Discharge plan as per oncology.  Assessment & Plan:  Active Problems:   Hyperlipidemia   Melanoma ([facility])   High grade B-cell lymphoma ([facility])   Diffuse large B cell lymphoma ([facility])   CNS lymphoma (Ariel [name])   Aphasia   Palliative care by specialist   High-grade non-Hodgkin's lymphoma with brain involvement: Follows with Dr. Marin Olp.  Also follows with radiation oncology, neuro-oncology.  MRI finding as above.  EEG without any evidence of epileptiform discharge.  Started on chemotherapy in 1/23,completed first cycle.Given leucovorin.On famciclovir, Diflucan Feels much better today  Fever/UTI: Currently afebrile.    Urine culture showed pansensitive E. coli.  Currently on keflex  Aphasia with right-sided weakness: due to left frontal lobe CNS lymphoma lesion with worsening edema.  On high-dose steroids.  Continue chemotherapy  Hyponatremia: Mild.  Suspected to be from sodium bicarb drip with dextrose.  Continue monitoring.  Hypokalemia: Currently being monitored and supplemented as needed  Hypophosphatemia: Supplemented and corrected  Elevated liver enzymes: Most likely secondary to chemotherapy.  Continue to monitor.  Also has mild thrombocytopenia which could be associated with malignancy  Generalized weakness: PT/OT consulted   Nutrition Problem: Moderate  Malnutrition Etiology: chronic illness, cancer and cancer related treatments    DVT prophylaxis:enoxaparin (LOVENOX) injection 40 mg Start: 03/20/22 1700     Code Status: Full Code  Family Communication: Daughter at bedside  Patient status:Inpatient  Patient is from :Home  Anticipated discharge BZ:JIRC  Estimated DC date:after oncology clearance   Consultants: Oncology  Procedures:None  Antimicrobials:  Anti-infectives (From admission, onward)    Start     Dose/Rate Route Frequency Ordered Stop   03/25/22 1000  fluconazole (DIFLUCAN) tablet 200 mg        200 mg Oral Daily 03/25/22 0649     03/25/22 1000  famciclovir (FAMVIR) tablet 500 mg        500 mg Oral Daily 03/25/22 0649     03/23/22 1000  cephALEXin (KEFLEX) capsule 500 mg        500 mg Oral Every 12 hours 03/22/22 1056     03/22/22 0800  ciprofloxacin (CIPRO) tablet 500 mg  Status:  Discontinued        500 mg Oral 2 times daily 03/22/22 0635 03/22/22 0703   03/22/22 0800  cefTRIAXone (ROCEPHIN) 1 g in sodium chloride 0.9 % 100 mL IVPB  Status:  Discontinued        1 g 200 mL/hr over 30 Minutes Intravenous Every 24 hours 03/22/22 0703 03/22/22 1056       Subjective: Patient seen and examined at bedside today.  Feels much better, in a very good mood.  Denies any complaints.  Daughter concerned about weakness.  PT/OT reconsulted.  No nausea vomiting or abdominal pain  Objective: Vitals:   03/24/22 2230 03/25/22 0542 03/25/22 1928 03/26/22 0521  BP: 122/64 132/67 (!) 127/58 (!) 120/57  Pulse: 61 76  64 67  Resp: '17 15 18 14  '$ Temp: 98.2 F (36.8 C) 98.4 F (36.9 C) (!) 97.5 F (36.4 C) 98.1 F (36.7 C)  TempSrc: Oral Oral Oral Oral  SpO2: 97% 98% 97% 98%  Weight:      Height:        Intake/Output Summary (Last 24 hours) at 03/26/2022 1123 Last data filed at 03/26/2022 1030 Gross per 24 hour  Intake 820 ml  Output 2700 ml  Net -1880 ml   Filed Weights   03/17/22 1000 03/17/22 1823  Weight: 69 kg  69 kg    Examination:  General exam: Overall comfortable, not in distress HEENT: PERRL Respiratory system:  no wheezes or crackles  Cardiovascular system: S1 & S2 heard, RRR.  Gastrointestinal system: Abdomen is nondistended, soft and nontender. Central nervous system: Alert and oriented Extremities: No edema, no clubbing ,no cyanosis Skin: No rashes, no ulcers,no icterus       Data Reviewed: I have personally reviewed following labs and imaging studies  CBC: Recent Labs  Lab 03/22/22 0500 03/23/22 0543 03/24/22 0610 03/25/22 0530 03/26/22 0520  WBC 6.3 7.1 8.3 8.2 7.6  NEUTROABS 4.5 5.4 6.9 7.4 7.1  HGB 11.6* 12.1 11.9* 11.4* 11.6*  HCT 33.6* 34.4* 32.5* 32.1* 33.0*  MCV 93.6 92.0 88.6 90.2 91.4  PLT 111* 114* 108* 100* 619*   Basic Metabolic Panel: Recent Labs  Lab 03/22/22 0500 03/23/22 0543 03/24/22 0610 03/25/22 0530 03/26/22 0520  NA 129* 130* 129* 131* 133*  K 4.1 3.8 3.6 3.6 3.5  CL 94* 93* 91* 93* 93*  CO2 '25 27 27 28 29  '$ GLUCOSE 125* 158* 126* 141* 130*  BUN '14 22 22 21 22  '$ CREATININE 0.61 0.63 0.57 0.63 0.79  CALCIUM 9.2 9.0 8.9 9.1 9.0  MG 2.0 2.0 2.2 2.5* 2.4  PHOS 2.3* 2.7 3.0 2.6 2.6     Recent Results (from the past 240 hour(s))  Resp panel by RT-PCR (RSV, Flu A&B, Covid) Anterior Nasal Swab     Status: None   Collection Time: 03/17/22 11:21 AM   Specimen: Anterior Nasal Swab  Result Value Ref Range Status   SARS Coronavirus 2 by RT PCR NEGATIVE NEGATIVE Final    Comment: (NOTE) SARS-CoV-2 target nucleic acids are NOT DETECTED.  The SARS-CoV-2 RNA is generally detectable in upper respiratory specimens during the acute phase of infection. The lowest concentration of SARS-CoV-2 viral copies this assay can detect is 138 copies/mL. A negative result does not preclude SARS-Cov-2 infection and should not be used as the sole basis for treatment or other patient management decisions. A negative result may occur with  improper specimen  collection/handling, submission of specimen other than nasopharyngeal swab, presence of viral mutation(s) within the areas targeted by this assay, and inadequate number of viral copies(<138 copies/mL). A negative result must be combined with clinical observations, patient history, and epidemiological information. The expected result is Negative.  Fact Sheet for Patients:  EntrepreneurPulse.com.au  Fact Sheet for Healthcare Providers:  IncredibleEmployment.be  This test is no t yet approved or cleared by the [place] FDA and  has been authorized for detection and/or diagnosis of SARS-CoV-2 by FDA under an Emergency Use Authorization (EUA). This EUA will remain  in effect (meaning this test can be used) for the duration of the COVID-19 declaration under Section 564(b)(1) of the Act, 21 U.S.C.section 360bbb-3(b)(1), unless the authorization is terminated  or revoked sooner.       Influenza A by PCR  NEGATIVE NEGATIVE Final   Influenza B by PCR NEGATIVE NEGATIVE Final    Comment: (NOTE) The Xpert Xpress SARS-CoV-2/FLU/RSV plus assay is intended as an aid in the diagnosis of influenza from Nasopharyngeal swab specimens and should not be used as a sole basis for treatment. Nasal washings and aspirates are unacceptable for Xpert Xpress SARS-CoV-2/FLU/RSV testing.  Fact Sheet for Patients: EntrepreneurPulse.com.au  Fact Sheet for Healthcare Providers: IncredibleEmployment.be  This test is not yet approved or cleared by the [place] FDA and has been authorized for detection and/or diagnosis of SARS-CoV-2 by FDA under an Emergency Use Authorization (EUA). This EUA will remain in effect (meaning this test can be used) for the duration of the COVID-19 declaration under Section 564(b)(1) of the Act, 21 U.S.C. section 360bbb-3(b)(1), unless the authorization is terminated or revoked.     Resp Syncytial  Virus by PCR NEGATIVE NEGATIVE Final    Comment: (NOTE) Fact Sheet for Patients: EntrepreneurPulse.com.au  Fact Sheet for Healthcare Providers: IncredibleEmployment.be  This test is not yet approved or cleared by the [place] FDA and has been authorized for detection and/or diagnosis of SARS-CoV-2 by FDA under an Emergency Use Authorization (EUA). This EUA will remain in effect (meaning this test can be used) for the duration of the COVID-19 declaration under Section 564(b)(1) of the Act, 21 U.S.C. section 360bbb-3(b)(1), unless the authorization is terminated or revoked.  Performed at [facility], [place] [address], [place], [place] [address]   Urine Culture     Status: Abnormal   Collection Time: 03/20/22  7:05 AM   Specimen: Urine, Catheterized  Result Value Ref Range Status   Specimen Description   Final    URINE, CATHETERIZED Performed at [facility] [address], [place], [place] [address]    Special Requests   Final    Immunocompromised Performed at [facility], [place] [address], [place], [place] [address]    Culture >=100,000 COLONIES/mL ESCHERICHIA COLI (A)  Final   Report Status 03/22/2022 FINAL  Final   Organism ID, Bacteria ESCHERICHIA COLI (A)  Final      Susceptibility   Escherichia coli - MIC*    AMPICILLIN <=2 SENSITIVE Sensitive     CEFAZOLIN <=4 SENSITIVE Sensitive     CEFEPIME <=0.12 SENSITIVE Sensitive     CEFTRIAXONE <=0.25 SENSITIVE Sensitive     CIPROFLOXACIN <=0.25 SENSITIVE Sensitive     GENTAMICIN <=1 SENSITIVE Sensitive     IMIPENEM <=0.25 SENSITIVE Sensitive     NITROFURANTOIN <=16 SENSITIVE Sensitive     TRIMETH/SULFA <=20 SENSITIVE Sensitive     AMPICILLIN/SULBACTAM <=2 SENSITIVE Sensitive     PIP/TAZO <=4 SENSITIVE Sensitive     * >=100,000 COLONIES/mL ESCHERICHIA COLI     Radiology Studies: No results found.  Scheduled Meds:  antiseptic oral rinse   15 mL Mouth Rinse Q6H   cephALEXin  500 mg Oral Q12H   Chlorhexidine Gluconate Cloth  6 each Topical Daily   dexamethasone (DECADRON) injection  4 mg Intravenous Daily   enoxaparin (LOVENOX) injection  40 mg Subcutaneous Q24H   famciclovir  500 mg Oral Daily   famotidine  20 mg Oral Daily   feeding supplement  237 mL Oral TID BM   fluconazole  200 mg Oral Daily   multivitamin with minerals  1 tablet Oral Daily   naphazoline-glycerin  2 drop Both Eyes Q8H   OLANZapine  5 mg Oral QHS   sodium bicarbonate/sodium chloride   Mouth Rinse Q4H  sodium chloride flush  10-40 mL Intracatheter Q12H   Continuous Infusions:  ondansetron 8 mg (03/26/22 0527)   sodium bicarbonate 150 mEq in dextrose 5 % 1,150 mL infusion 50 mL/hr at 03/25/22 1612     LOS: 9 days   Shelly Coss, MD [facility] P1/28/2024, 11:23 AM

## 2022-03-26 NOTE — Progress Notes (Signed)
Methotrexate Level Update Note  Lab: MTX level drawn at 0636 this AM was 0.15  A/P: MTX infusion finished at 1947 on 1/25 and MTX level drawn 1/2 at 0626, which is now ~59hr after infusion finished. Per Leucovorin dose nomogram, dose of Leucovorin should again stay at current q6h.  Adrian Saran, PharmD, BCPS Secure Chat if ?s 03/26/2022 12:41 PM

## 2022-03-27 ENCOUNTER — Encounter (HOSPITAL_COMMUNITY): Payer: Self-pay | Admitting: Hematology & Oncology

## 2022-03-27 DIAGNOSIS — C8589 Other specified types of non-Hodgkin lymphoma, extranodal and solid organ sites: Secondary | ICD-10-CM | POA: Diagnosis not present

## 2022-03-27 DIAGNOSIS — C8330 Diffuse large B-cell lymphoma, unspecified site: Secondary | ICD-10-CM | POA: Diagnosis not present

## 2022-03-27 DIAGNOSIS — Z79899 Other long term (current) drug therapy: Secondary | ICD-10-CM

## 2022-03-27 DIAGNOSIS — R5383 Other fatigue: Secondary | ICD-10-CM | POA: Diagnosis not present

## 2022-03-27 DIAGNOSIS — N390 Urinary tract infection, site not specified: Secondary | ICD-10-CM | POA: Diagnosis not present

## 2022-03-27 LAB — URINALYSIS, COMPLETE (UACMP) WITH MICROSCOPIC
Bacteria, UA: NONE SEEN
Bilirubin Urine: NEGATIVE
Glucose, UA: NEGATIVE mg/dL
Hgb urine dipstick: NEGATIVE
Ketones, ur: NEGATIVE mg/dL
Leukocytes,Ua: NEGATIVE
Nitrite: NEGATIVE
Protein, ur: NEGATIVE mg/dL
Specific Gravity, Urine: 1.01 (ref 1.005–1.030)
pH: 9 — ABNORMAL HIGH (ref 5.0–8.0)

## 2022-03-27 LAB — COMPREHENSIVE METABOLIC PANEL
ALT: 94 U/L — ABNORMAL HIGH (ref 0–44)
AST: 32 U/L (ref 15–41)
Albumin: 2.9 g/dL — ABNORMAL LOW (ref 3.5–5.0)
Alkaline Phosphatase: 59 U/L (ref 38–126)
Anion gap: 10 (ref 5–15)
BUN: 17 mg/dL (ref 8–23)
CO2: 27 mmol/L (ref 22–32)
Calcium: 9 mg/dL (ref 8.9–10.3)
Chloride: 90 mmol/L — ABNORMAL LOW (ref 98–111)
Creatinine, Ser: 0.75 mg/dL (ref 0.44–1.00)
GFR, Estimated: >60 mL/min (ref >60)
Glucose, Bld: 121 mg/dL — ABNORMAL HIGH (ref 70–99)
Potassium: 4.2 mmol/L (ref 3.5–5.1)
Sodium: 127 mmol/L — ABNORMAL LOW (ref 135–145)
Total Bilirubin: 0.5 mg/dL (ref 0.3–1.2)
Total Protein: 5.7 g/dL — ABNORMAL LOW (ref 6.5–8.1)

## 2022-03-27 LAB — CBC WITH DIFFERENTIAL/PLATELET
Abs Immature Granulocytes: 0.03 10*3/uL (ref 0.00–0.07)
Basophils Absolute: 0 10*3/uL (ref 0.0–0.1)
Basophils Relative: 0 %
Eosinophils Absolute: 0.1 10*3/uL (ref 0.0–0.5)
Eosinophils Relative: 1 %
HCT: 32.4 % — ABNORMAL LOW (ref 36.0–46.0)
Hemoglobin: 11.1 g/dL — ABNORMAL LOW (ref 12.0–15.0)
Immature Granulocytes: 0 %
Lymphocytes Relative: 6 %
Lymphs Abs: 0.4 10*3/uL — ABNORMAL LOW (ref 0.7–4.0)
MCH: 32 pg (ref 26.0–34.0)
MCHC: 34.3 g/dL (ref 30.0–36.0)
MCV: 93.4 fL (ref 80.0–100.0)
Monocytes Absolute: 0 10*3/uL — ABNORMAL LOW (ref 0.1–1.0)
Monocytes Relative: 0 %
Neutro Abs: 6.7 10*3/uL (ref 1.7–7.7)
Neutrophils Relative %: 93 %
Platelets: 95 10*3/uL — ABNORMAL LOW (ref 150–400)
RBC: 3.47 MIL/uL — ABNORMAL LOW (ref 3.87–5.11)
RDW: 12.4 % (ref 11.5–15.5)
WBC: 7.2 10*3/uL (ref 4.0–10.5)
nRBC: 0 % (ref 0.0–0.2)

## 2022-03-27 LAB — PHOSPHORUS: Phosphorus: 2.3 mg/dL — ABNORMAL LOW (ref 2.5–4.6)

## 2022-03-27 LAB — METHOTREXATE: Methotrexate: 0.08

## 2022-03-27 LAB — MAGNESIUM: Magnesium: 2.2 mg/dL (ref 1.7–2.4)

## 2022-03-27 LAB — GLUCOSE, CAPILLARY: Glucose-Capillary: 97 mg/dL (ref 70–99)

## 2022-03-27 MED ORDER — LEUCOVORIN CALCIUM INJECTION 100 MG
15.0000 mg/m2 | Freq: Three times a day (TID) | INTRAMUSCULAR | Status: AC
  Administered 2022-03-27 – 2022-03-28 (×3): 28 mg via INTRAVENOUS
  Filled 2022-03-27 (×4): qty 1.4

## 2022-03-27 MED ORDER — FLUCONAZOLE 100 MG PO TABS
100.0000 mg | ORAL_TABLET | Freq: Every day | ORAL | Status: DC
  Administered 2022-03-27 – 2022-04-14 (×19): 100 mg via ORAL
  Filled 2022-03-27 (×20): qty 1

## 2022-03-27 MED ORDER — SODIUM CHLORIDE 1 G PO TABS
1.0000 g | ORAL_TABLET | Freq: Three times a day (TID) | ORAL | Status: AC
  Administered 2022-03-27 – 2022-03-28 (×3): 1 g via ORAL
  Filled 2022-03-27 (×3): qty 1

## 2022-03-27 MED ORDER — SODIUM PHOSPHATES 45 MMOLE/15ML IV SOLN
10.0000 mmol | Freq: Once | INTRAVENOUS | Status: AC
  Administered 2022-03-27: 10 mmol via INTRAVENOUS
  Filled 2022-03-27: qty 3.33

## 2022-03-27 NOTE — TOC Initial Note (Addendum)
Transition of Care [facility]) - Initial/Assessment Note    Patient Details  Name: Ariel [name] MRN: [id number] Date of Birth: 30-Jul-1955  Transition of Care [facility]) CM/SW Contact:    Angelita Ingles, RN Phone Number:[phone]  03/27/2022, 11:24 AM  Clinical Narrative:                 [facility] acknowledges consult for SNF. CM at bedside per patients request. Patient and daughter both states that patient would like to go to SNF . Patient states that she is from home alone and recognizes that she is weaker and in need of assistance with care. CM has explained the process of SNF placement. Daughter and patient state that [facility] is their first choice. CM has sent message to OT signed in for patient to request evaluations for recommendations. CM will follow up .   1453 Therapy recommendations have been entered. CM will initiate SNF workup. CM has attempted to reach out to [facility] to determine if facility will have a bed for patient. There is no answer, voicemail has been left.   [facility] # [phone] A, FL2 complete patient info faxed out for bed offers. Internal message via Hub has been sent to [facility].         Patient Goals and CMS Choice            Expected Discharge Plan and Services                                              Prior Living Arrangements/Services                       Activities of Daily Living Home Assistive Devices/Equipment: Cane (specify quad or straight) ADL Screening (condition at time of admission) Patient's cognitive ability adequate to safely complete daily activities?: Yes Is the patient deaf or have difficulty hearing?: No Does the patient have difficulty seeing, even when wearing glasses/contacts?: No Does the patient have difficulty concentrating, remembering, or making decisions?: No Patient able to express need for assistance with ADLs?: Yes Does the patient have difficulty dressing or bathing?: Yes Independently  performs ADLs?: Yes (appropriate for developmental age) Does the patient have difficulty walking or climbing stairs?: Yes Weakness of Legs: Both Weakness of Arms/Hands: None  Permission Sought/Granted                  Emotional Assessment              Admission diagnosis:  Aphasia [R47.01] 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] Stroke-like symptoms [R29.90] Patient Active Problem List   Diagnosis Date Noted   Palliative care by specialist 03/23/2022   Aphasia 03/17/2022   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   Primary localized osteoarthritis of right hip 01/10/2022   Primary localized osteoarthritis of left hip 10/11/2021   Preoperative clearance 09/13/2021   Encounter for screening mammogram for malignant neoplasm of breast 09/13/2021   Pancytopenia ([place]) 05/16/2021   Lymphoma malignant, 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   Diffuse large B cell lymphoma ([place]) 03/07/2021   High grade B-cell lymphoma ([place]) 12/22/2020   Goals of care, counseling/discussion 12/22/2020   Malignant neoplasm metastatic to brain ([place]) 12/09/2020   Brain tumor ([place]) 12/08/2020   Hyponatremia 12/08/2020   Night sweat 78/46/9629   Complication of anesthesia 11/22/2020   PONV (postoperative nausea and vomiting) 11/22/2020   Hyperglycemia  11/10/2020   TIA (transient ischemic attack) 10/07/2020   Iron deficiency anemia 10/07/2020   Melanoma ([place]) 2021   Multinodular goiter 12/22/2013   Subclinical hyperthyroidism 12/19/2013   Urinary incontinence 11/19/2013   Hyperlipidemia 11/19/2012   Routine general medical examination at a health care facility 11/18/2012   PCP:  [name], NP Pharmacy:   [facility] [place] - [address] [place] [address] Phone: [phone] Fax: [phone]     Social Determinants of Health (SDOH) Social History: [place]: No Food Insecurity (03/18/2022)  Housing: Low Risk  (03/18/2022)   Transportation Needs: No Transportation Needs (03/18/2022)  Utilities: Not At Risk (03/18/2022)  Alcohol Screen: Low Risk  (07/08/2020)  Depression (PHQ2-9): Low Risk  (07/13/2021)  Financial Resource Strain: Low Risk  (07/13/2021)  Physical Activity: Insufficiently Active (07/13/2021)  Social Connections: Socially Isolated (07/13/2021)  Stress: No Stress Concern Present (07/13/2021)  Tobacco Use: Low Risk  (03/17/2022)   SDOH Interventions:     Readmission Risk Interventions    08/02/2021    3:11 PM 07/05/2021    2:17 PM 06/08/2021   11:18 AM  Readmission Risk Prevention Plan  Transportation Screening Complete Complete Complete  Medication Review Press photographer) Complete Complete Complete  PCP or Specialist appointment within 3-5 days of discharge Complete Complete Complete  HRI or Home Care Consult Complete Complete Complete  SW Recovery Care/Counseling Consult Complete Complete Complete  Palliative Care Screening Not Applicable Not Applicable Not [place] Not Applicable Not Applicable Not Applicable

## 2022-03-27 NOTE — Progress Notes (Signed)
Occupational Therapy Treatment Patient Details Name: Ariel [name] MRN: [id number] DOB: March 14, 1955 Today's Date: 03/27/2022   History of present illness patient is a 67 year old white female presented 03/17/22 with  right facial droop, dysarthria, aphasia. MRI showed recurrence non-Hodgkin's lymphoma in left frontal lymphoma lesion PMH-CNS lymphoma, melanoma of the skin, high-grade B-cell lymphoma   OT comments  Patient was noted to continue to require min A for LB dress/bathing tasks with min guard for mobility with cues for problem solving through obstacles in room. Patient and daughter both present reporting that patient would be transitioning home alone at time of d/c from hospital with no supports available. Patient would need 24/7 supervision at least to navigate safely through ADL tasks and IADLs. Patient would continue to benefit from skilled OT services at this time while admitted and after d/c to address noted deficits in order to improve overall safety and independence in ADLs. D/c plan was updated at this time. OT to continue to follow.     Recommendations for follow up therapy are one component of a multi-disciplinary discharge planning process, led by the attending physician.  Recommendations may be updated based on patient status, additional functional criteria and insurance authorization.    Follow Up Recommendations  Skilled nursing-short term rehab (<3 hours/day)     Assistance Recommended at Discharge Intermittent Supervision/Assistance  Patient can return home with the following  A little help with bathing/dressing/bathroom;Assistance with cooking/housework;Direct supervision/assist for medications management;Direct supervision/assist for financial management;Assist for transportation;Help with stairs or ramp for entrance   Equipment Recommendations  None recommended by OT       Precautions / Restrictions Precautions Precautions: Fall Restrictions Weight Bearing  Restrictions: No       Mobility Bed Mobility Overal bed mobility: Modified Independent      Transfers Overall transfer level: Needs assistance Equipment used: Rolling walker (2 wheels) Transfers: Sit to/from Stand Sit to Stand: Min guard     Step pivot transfers: Min guard           Balance Overall balance assessment: Needs assistance Sitting-balance support: No upper extremity supported, Feet supported Sitting balance-Leahy Scale: Good     Standing balance support: No upper extremity supported Standing balance-Leahy Scale: Fair         ADL either performed or assessed with clinical judgement   ADL Overall ADL's : Needs assistance/impaired Eating/Feeding: Set up;Sitting   Grooming: Min guard;Standing   Upper Body Bathing: Set up;Sitting   Lower Body Bathing: Sit to/from stand;Minimal assistance Lower Body Bathing Details (indicate cue type and reason): pain bringing RLE to lap. recent hip surgery Upper Body Dressing : Set up;Sitting   Lower Body Dressing: Sit to/from stand;Minimal assistance Lower Body Dressing Details (indicate cue type and reason): uses reacher and sock aid at home Toilet Transfer: Min guard;Rolling walker (2 wheels);Ambulation   Toileting- Clothing Manipulation and Hygiene: Minimal assistance;Sit to/from stand       Functional mobility during ADLs: Min guard;Rolling walker (2 wheels) General ADL Comments: Min guard A for safety    Extremity/Trunk Assessment Upper Extremity Assessment Upper Extremity Assessment: Overall WFL for tasks assessed   Lower Extremity Assessment Lower Extremity Assessment: Defer to PT evaluation   Cervical / Trunk Assessment Cervical / Trunk Assessment: Normal    Vision   Vision Assessment?: No apparent visual deficits          Cognition Arousal/Alertness: Awake/alert Behavior During Therapy: WFL for tasks assessed/performed Overall Cognitive Status: Impaired/Different from baseline  General Comments: cues for safety. mild memory issues during session. daughter present                   Pertinent Vitals/ Pain       Pain Assessment Pain Assessment: No/denies pain         Frequency  Min 2X/week        Progress Toward Goals  OT Goals(current goals can now be found in the care plan section)  Progress towards OT goals: Progressing toward goals     Plan Discharge plan remains appropriate       AM-PAC OT "6 Clicks" Daily Activity     Outcome Measure   Help from another person eating meals?: None Help from another person taking care of personal grooming?: A Little Help from another person toileting, which includes using toliet, bedpan, or urinal?: A Little Help from another person bathing (including washing, rinsing, drying)?: A Little Help from another person to put on and taking off regular upper body clothing?: A Little Help from another person to put on and taking off regular lower body clothing?: A Little 6 Click Score: 19    End of Session Equipment Utilized During Treatment: Gait belt;Rolling walker (2 wheels)  OT Visit Diagnosis: Unsteadiness on feet (R26.81);Muscle weakness (generalized) (M62.81);Other symptoms and signs involving cognitive function;Cognitive communication deficit (R41.841)   Activity Tolerance Patient tolerated treatment well   Patient Left in bed;with call bell/phone within reach;with bed alarm set;with family/visitor present   Nurse Communication          Time: 3888-7579 OT Time Calculation (min): 18 min  Charges: OT General Charges $OT Visit: 1 Visit OT Treatments $Self Care/Home Management : 8-22 mins  Rennie Plowman, MS Acute Rehabilitation Department Office# [phone]   Willa Rough 03/27/2022, 12:28 PM

## 2022-03-27 NOTE — NC FL2 (Signed)
[place] LEVEL OF CARE FORM     IDENTIFICATION  Patient Name: Ariel [name] Birthdate: [birth year] Sex: female Admission Date (Current Location): 03/17/2022  [facility] and [place] Number:  Herbalist and Address:  [facility],  [place], [place]      Provider Number: [id number]  Attending Physician Name and Address:  Shelly Coss, MD  Relative Name and Phone Number:  [place] daughter [phone]    Current Level of Care: Hospital Recommended Level of Care: [place] Prior Approval Number:    Date Approved/Denied:   PASRR Number: [id number]  Discharge Plan: SNF    Current Diagnoses: Patient Active Problem List   Diagnosis Date Noted   Palliative care by specialist 03/23/2022   Aphasia 03/17/2022   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   Primary localized osteoarthritis of right hip 01/10/2022   Primary localized osteoarthritis of left hip 10/11/2021   Preoperative clearance 09/13/2021   Encounter for screening mammogram for malignant neoplasm of breast 09/13/2021   Pancytopenia ([place]) 05/16/2021   Lymphoma malignant, 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   Diffuse large B cell lymphoma ([place]) 03/07/2021   High grade B-cell lymphoma ([place]) 12/22/2020   Goals of care, counseling/discussion 12/22/2020   Malignant neoplasm metastatic to brain ([place]) 12/09/2020   Brain tumor ([place]) 12/08/2020   Hyponatremia 12/08/2020   Night sweat 00/76/2263   Complication of anesthesia 11/22/2020   PONV (postoperative nausea and vomiting) 11/22/2020   Hyperglycemia 11/10/2020   TIA (transient ischemic attack) 10/07/2020   Iron deficiency anemia 10/07/2020   Melanoma ([place]) 2021   Multinodular goiter 12/22/2013   Subclinical hyperthyroidism 12/19/2013   Urinary incontinence 11/19/2013   Hyperlipidemia 11/19/2012   Routine general medical examination at a health care facility 11/18/2012    Orientation RESPIRATION BLADDER  Height & Weight     Self, Time, Situation, Place    Continent Weight: 61.1 kg Height:  '5\' 7"'$  (170.2 cm)  BEHAVIORAL SYMPTOMS/MOOD NEUROLOGICAL BOWEL NUTRITION STATUS      Continent Diet  AMBULATORY STATUS COMMUNICATION OF NEEDS Skin   Limited Assist Verbally Normal                       Personal Care Assistance Level of Assistance  Bathing, Feeding, Dressing Bathing Assistance: Limited assistance Feeding assistance: Independent Dressing Assistance: Limited assistance     Functional Limitations Info  Sight, Hearing, Speech Sight Info: Adequate Hearing Info: Adequate Speech Info: Adequate    SPECIAL CARE FACTORS FREQUENCY  PT (By licensed PT), OT (By licensed OT)     PT Frequency: 5x/wk OT Frequency: 5x/wk            Contractures Contractures Info: Not present    Additional Factors Info  Code Status, Allergies, Psychotropic, Insulin Sliding Scale, Isolation Precautions, Suctioning Needs Code Status Info: Full Allergies Info: Decadron (Dexamethasone), Plavix (Clopidogrel), Prednisone, Codeine Psychotropic Info: n/a       (see discharge summary) Insulin Sliding Scale Info: see discharge summary Isolation Precautions Info: n/a Suctioning Needs: n/a   Current Medications (03/27/2022):  This is the current hospital active medication list Current Facility-Administered Medications  Medication Dose Route Frequency Provider Last Rate Last Admin   acetaminophen (TYLENOL) tablet 650 mg  650 mg Oral Q6H PRN [place] Eric J, DO   650 mg at 03/27/22 0622   antiseptic oral rinse (BIOTENE) solution 15 mL  15 mL Mouth Rinse Q6H Volanda Napoleon, MD   15 mL at 03/27/22 1413  cephALEXin (KEFLEX) capsule 500 mg  500 mg Oral Q12H Shelly Coss, MD   500 mg at 03/27/22 0825   Chlorhexidine Gluconate Cloth 2 % PADS 6 each  6 each Topical Daily Cherene Altes, MD   6 each at 03/27/22 0824   dexamethasone (DECADRON) injection 4 mg  4 mg Intravenous Daily Volanda Napoleon, MD   4  mg at 03/27/22 0825   enoxaparin (LOVENOX) injection 40 mg  40 mg Subcutaneous Q24H Volanda Napoleon, MD   40 mg at 03/26/22 1801   famciclovir (FAMVIR) tablet 500 mg  500 mg Oral Daily Volanda Napoleon, MD   500 mg at 03/27/22 3474   famotidine (PEPCID) tablet 20 mg  20 mg Oral Daily Cherene Altes, MD   20 mg at 03/27/22 2595   feeding supplement (ENSURE ENLIVE / ENSURE PLUS) liquid 237 mL  237 mL Oral TID BM Shelly Coss, MD   237 mL at 03/27/22 1413   fluconazole (DIFLUCAN) tablet 100 mg  100 mg Oral Daily Volanda Napoleon, MD   100 mg at 03/27/22 0823   Hot Pack 1 packet  1 packet Topical Once PRN Volanda Napoleon, MD       leucovorin injection 28 mg  15 mg/m2 (Treatment Plan Recorded) Intravenous Q8H Volanda Napoleon, MD   28 mg at 03/27/22 1543   multivitamin with minerals tablet 1 tablet  1 tablet Oral Daily Shelly Coss, MD   1 tablet at 03/26/22 6387   naphazoline-glycerin (CLEAR EYES REDNESS) ophth solution 2 drop  2 drop Both Eyes Q8H Volanda Napoleon, MD   2 drop at 03/27/22 1542   OLANZapine (ZYPREXA) tablet 5 mg  5 mg Oral QHS Volanda Napoleon, MD   5 mg at 03/26/22 2114   phenol (CHLORASEPTIC) mouth spray 1 spray  1 spray Mouth/Throat PRN Shelly Coss, MD       polyethylene glycol (MIRALAX / GLYCOLAX) packet 17 g  17 g Oral Daily PRN Volanda Napoleon, MD       prochlorperazine (COMPAZINE) injection 10 mg  10 mg Intravenous Q6H PRN [place] Eric J, DO       senna-docusate (Senokot-S) tablet 1 tablet  1 tablet Oral QHS PRN Volanda Napoleon, MD   1 tablet at 03/27/22 5643   sodium bicarbonate/sodium chloride mouthwash 1038m   Mouth Rinse Q4H EVolanda Napoleon MD   Given at 03/27/22 1542   sodium chloride flush (NS) 0.9 % injection 10-40 mL  10-40 mL Intracatheter Q12H MJoette CatchingT, MD   10 mL at 03/27/22 0823   sodium chloride flush (NS) 0.9 % injection 10-40 mL  10-40 mL Intracatheter PRN MCherene Altes MD       sodium chloride tablet 1 g  1 g Oral TID WC  Adhikari, Amrit, MD   1 g at 03/27/22 1413   sodium phosphate 10 mmol in dextrose 5 % 250 mL infusion  10 mmol Intravenous Once AShelly Coss MD 42 mL/hr at 03/27/22 1054 10 mmol at 03/27/22 1054   traMADol (ULTRAM) tablet 50 mg  50 mg Oral Q6H PRN MCherene Altes MD       Facility-Administered Medications Ordered in Other Encounters  Medication Dose Route Frequency Provider Last Rate Last Admin   sodium chloride flush (NS) 0.9 % injection 10 mL  10 mL Intravenous PRN EVolanda Napoleon MD   10 mL at 10/04/21 0946     Discharge Medications: Please see discharge  summary for a list of discharge medications.  Relevant Imaging Results:  Relevant Lab Results:   Additional Information SS# [id number]  Angelita Ingles, RN

## 2022-03-27 NOTE — Progress Notes (Addendum)
Pharmacy Note: Methotrexate Level Update  Spoke with Meda Coffee at [facility] laboratory for MTX level.   MTX level drawn at 0600 this AM was 0.08. Results given to Dr. Marin Olp. Per MD, reduce Leucovorin to '15mg'$ /m2 IV q8h.   Next MTX level ordered for tomorrow AM, 03/28/22.   Lindell Spar, PharmD, BCPS Clinical Pharmacist 03/27/2022 11:03 AM

## 2022-03-27 NOTE — Progress Notes (Signed)
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  CT of the head revealed increased edema in the area for left frontal lobe lesion with no acute hemorrhage or evidence of CVA.  MRI of the brain showed progression of lymphoma in the left frontal operculum.  Oncology following, started in house chemotherapy.  Finished the current cycle of chemotherapy.  Patient and family interested in going to SNF  Assessment & Plan:  Active Problems:   Hyperlipidemia   Melanoma ([place])   High grade B-cell lymphoma ([facility])   Diffuse large B cell lymphoma ([facility])   CNS lymphoma ([place])   Aphasia   Palliative care by specialist   High-grade non-Hodgkin's lymphoma with brain involvement: Follows with Dr. Marin Olp.  Also follows with radiation oncology, neuro-oncology.  MRI finding as above.  EEG without any evidence of epileptiform discharge.  Started on chemotherapy in 1/23,completed first cycle.Given leucovorin.On famciclovir, Diflucan Feels much better today  Fever/UTI: Currently afebrile.    Urine culture showed pansensitive E. coli.  Currently on keflex,plan for 5 days course  Aphasia with right-sided weakness: due to left frontal lobe CNS lymphoma lesion with worsening edema.  On high-dose steroids.Given chemotherapy  Hyponatremia: Suspected to be from sodium bicarb drip with dextrose.  Continue monitoring.  Check BMP tomorrow, started on 3 doses of sodium chloride tablets  Hypophosphatemia: Supplemented   Elevated liver enzymes: Most likely secondary to chemotherapy.  Continue to monitor.  Also has mild thrombocytopenia which could be associated with malignancy.improving  Generalized  weakness: PT/OT consulted.  Patient and family requesting SNF.  TOC following   Nutrition Problem: Moderate Malnutrition Etiology: chronic illness, cancer and cancer related treatments    DVT prophylaxis:enoxaparin (LOVENOX) injection 40 mg Start: 03/20/22 1700     Code Status: Full Code  Family Communication: Daughter at bedside  Patient status:Inpatient  Patient is from :Home  Anticipated discharge YQ:MGNO versus SNF  Estimated DC date:after oncology clearance   Consultants: Oncology  Procedures:None  Antimicrobials:  Anti-infectives (From admission, onward)    Start     Dose/Rate Route Frequency Ordered Stop   03/27/22 1000  fluconazole (DIFLUCAN) tablet 100 mg        100 mg Oral Daily 03/27/22 0701     03/25/22 1000  fluconazole (DIFLUCAN) tablet 200 mg  Status:  Discontinued        200 mg Oral Daily 03/25/22 0649 03/27/22 0701   03/25/22 1000  famciclovir (FAMVIR) tablet 500 mg        500 mg Oral Daily 03/25/22 0649     03/23/22 1000  cephALEXin (KEFLEX) capsule 500 mg        500 mg Oral Every 12 hours 03/22/22 1056 03/28/22 0959   03/22/22 0800  ciprofloxacin (CIPRO) tablet 500 mg  Status:  Discontinued        500 mg Oral 2 times daily 03/22/22 0635 03/22/22 0703   03/22/22 0800  cefTRIAXone (ROCEPHIN) 1 g in sodium chloride 0.9 % 100 mL IVPB  Status:  Discontinued        1 g 200 mL/hr over 30 Minutes Intravenous Every 24 hours 03/22/22 0703 03/22/22 1056       Subjective: Patient seen and examined at bedside today.  In very good mood.  Feels good.  No nausea, vomiting or abdomen pain. Objective: Vitals:   03/26/22 0521 03/26/22 1401 03/26/22 2124 03/27/22 0622  BP: (!) 120/57 (!) 130/58 126/61 (!) 129/52  Pulse: 67 83 79 80  Resp: '14 14 16 17  '$ Temp: 98.1 F (36.7 C) 99.3 F (37.4 C) 100.1 F (37.8 C) 99.1 F (37.3 C)  TempSrc: Oral Oral Oral Oral  SpO2: 98% 98% 98% 97%  Weight:    61.1 kg  Height:        Intake/Output Summary (Last 24 hours)  at 03/27/2022 1150 Last data filed at 03/27/2022 6948 Gross per 24 hour  Intake 360 ml  Output 1700 ml  Net -1340 ml   Filed Weights   03/17/22 1000 03/17/22 1823 03/27/22 0622  Weight: 69 kg 69 kg 61.1 kg    Examination:  General exam: Overall comfortable, not in distress HEENT: PERRL Respiratory system:  no wheezes or crackles  Cardiovascular system: S1 & S2 heard, RRR.  Gastrointestinal system: Abdomen is nondistended, soft and nontender. Central nervous system: Alert and oriented Extremities: No edema, no clubbing ,no cyanosis Skin: No rashes, no ulcers,no icterus        Data Reviewed: I have personally reviewed following labs and imaging studies  CBC: Recent Labs  Lab 03/23/22 0543 03/24/22 0610 03/25/22 0530 03/26/22 0520 03/27/22 0600  WBC 7.1 8.3 8.2 7.6 7.2  NEUTROABS 5.4 6.9 7.4 7.1 6.7  HGB 12.1 11.9* 11.4* 11.6* 11.1*  HCT 34.4* 32.5* 32.1* 33.0* 32.4*  MCV 92.0 88.6 90.2 91.4 93.4  PLT 114* 108* 100* 102* 95*   Basic Metabolic Panel: Recent Labs  Lab 03/23/22 0543 03/24/22 0610 03/25/22 0530 03/26/22 0520 03/27/22 0600  NA 130* 129* 131* 133* 127*  K 3.8 3.6 3.6 3.5 4.2  CL 93* 91* 93* 93* 90*  CO2 '27 27 28 29 27  '$ GLUCOSE 158* 126* 141* 130* 121*  BUN '22 22 21 22 17  '$ CREATININE 0.63 0.57 0.63 0.79 0.75  CALCIUM 9.0 8.9 9.1 9.0 9.0  MG 2.0 2.2 2.5* 2.4 2.2  PHOS 2.7 3.0 2.6 2.6 2.3*     Recent Results (from the past 240 hour(s))  Urine Culture     Status: Abnormal   Collection Time: 03/20/22  7:05 AM   Specimen: Urine, Catheterized  Result Value Ref Range Status   Specimen Description   Final    URINE, CATHETERIZED Performed at [facility], [place] [address], [place], [place] [address]    Special Requests   Final    Immunocompromised Performed at [facility], [place] [address], [place], [place] [address]    Culture >=100,000 COLONIES/mL ESCHERICHIA COLI (A)  Final   Report Status 03/22/2022 FINAL   Final   Organism ID, Bacteria ESCHERICHIA COLI (A)  Final      Susceptibility   Escherichia coli - MIC*    AMPICILLIN <=2 SENSITIVE Sensitive     CEFAZOLIN <=4 SENSITIVE Sensitive     CEFEPIME <=0.12 SENSITIVE Sensitive     CEFTRIAXONE <=0.25 SENSITIVE Sensitive     CIPROFLOXACIN <=0.25 SENSITIVE Sensitive     GENTAMICIN <=1 SENSITIVE Sensitive     IMIPENEM <=0.25 SENSITIVE Sensitive     NITROFURANTOIN <=16 SENSITIVE Sensitive     TRIMETH/SULFA <=20 SENSITIVE Sensitive     AMPICILLIN/SULBACTAM <=2 SENSITIVE Sensitive     PIP/TAZO <=4 SENSITIVE Sensitive     * >=100,000 COLONIES/mL ESCHERICHIA COLI     Radiology Studies: No  results found.  Scheduled Meds:  antiseptic oral rinse  15 mL Mouth Rinse Q6H   cephALEXin  500 mg Oral Q12H   Chlorhexidine Gluconate Cloth  6 each Topical Daily   dexamethasone (DECADRON) injection  4 mg Intravenous Daily   enoxaparin (LOVENOX) injection  40 mg Subcutaneous Q24H   famciclovir  500 mg Oral Daily   famotidine  20 mg Oral Daily   feeding supplement  237 mL Oral TID BM   fluconazole  100 mg Oral Daily   leucovorin  15 mg/m2 (Treatment Plan Recorded) Intravenous Q8H   multivitamin with minerals  1 tablet Oral Daily   naphazoline-glycerin  2 drop Both Eyes Q8H   OLANZapine  5 mg Oral QHS   sodium bicarbonate/sodium chloride   Mouth Rinse Q4H   sodium chloride flush  10-40 mL Intracatheter Q12H   sodium chloride  1 g Oral TID WC   Continuous Infusions:  sodium phosphate 10 mmol in dextrose 5 % 250 mL infusion 10 mmol (03/27/22 1054)     LOS: 10 days   Shelly Coss, MD [facility] P1/29/2024, 11:50 AM

## 2022-03-27 NOTE — Progress Notes (Signed)
So far, she really has had very little in the way of toxicity from the methotrexate.  We will see what her methotrexate level is today.  [name] methotrexate level 0.15.  She is having little bit of burning with the throat.  We do have her on Diflucan.  When I saw her on Saturday, she had thrush.  I do not see any thrush in the throat.  She does have little bit of redness.  I do have her on Famvir.  She is weak.  She says that she just will not be able to go home.  I suspect that she probably is going to need skilled nursing.  Her daughter came up from [place] to be with her.  She has sodium 127.  Potassium 4.2.  BUN 17 creatinine 0.75.  Phosphorus is 2.3.  Albumin 2.9.  Her SGPT 94 SGOT 32.  This is come down quite well which is the usual for her with methotrexate.  The CBC is not back yet.  She has had no fever.  She has a E. coli in the urine.  She is on Keflex for this.  I would recheck the urinalysis to see how this looks.  She is eating okay.  She is not having much in the way of nausea for I can tell.  There is no vomiting.  She has had no diarrhea.  Her vital signs are temperature of 99.1.  Pulse 80.  Blood pressure 129/52.  Her head and neck exam shows some erythema in the oral cavity.  There is no obvious mucositis.  She has no adenopathy in the neck.  Lungs are clear.  Cardiac exam regular rate and rhythm.  Abdomen is soft.  Bowel sounds are present.  She has no fluid wave.  There is no guarding or rebound tenderness peer extremities shows no clubbing, cyanosis or edema.  Neurological exam is nonfocal.  We have to wait the methotrexate level to get down to 0.05.  She is on leucovorin rescue.  We have her on supportive therapy with Diflucan and Famvir.  She is on Keflex for the UTI.  Again our goal is going had to be skilled nursing.  We will have to see if case management can help with this.  I do appreciate the incredible care that she is getting from all the staff up on  6E  [name], MD  [facility] 11:28

## 2022-03-27 NOTE — Progress Notes (Signed)
Physical Therapy Treatment Patient Details Name: Ariel [name] MRN: [id number] DOB: 17-Jan-1956 Today's Date: 03/27/2022   History of Present Illness 67 year old white female presented 03/17/22 with  right facial droop, dysarthria, aphasia. MRI showed recurrence non-Hodgkin's lymphoma in left frontal lymphoma lesion PMH-CNS lymphoma, melanoma of the skin, high-grade B-cell lymphoma    PT Comments    Pt is feeling much better today and eager to participate with therapy. Pt is min guard assist with transfers. Pt ambulated  100 ft with RW min guard assist. Pt completed standing exercises with min guard assist with UE support on RW. Pt did demonstrate dec safety with ambulation and did feel nauseated after exercising. Pt currently lives alone and is not safe to d/c home alone. Pt will continue to benefit from acute PT to maximize mobility and independence. D/c plan has been updated to SNF for continued therapy and independence.   Recommendations for follow up therapy are one component of a multi-disciplinary discharge planning process, led by the attending physician.  Recommendations may be updated based on patient status, additional functional criteria and insurance authorization.  Follow Up Recommendations  Skilled nursing-short term rehab (<3 hours/day) Can patient physically be transported by private vehicle: Yes   Assistance Recommended at Discharge Intermittent Supervision/Assistance  Patient can return home with the following A little help with walking and/or transfers;Assistance with cooking/housework;Help with stairs or ramp for entrance   Equipment Recommendations  None recommended by PT    Recommendations for Other Services       Precautions / Restrictions Precautions Precautions: Fall Restrictions Weight Bearing Restrictions: No     Mobility  Bed Mobility Overal bed mobility: Modified Independent                  Transfers Overall transfer level: Needs  assistance Equipment used: Rolling walker (2 wheels) Transfers: Sit to/from Stand Sit to Stand: Min guard           General transfer comment: Min guard A for safety    Ambulation/Gait Ambulation/Gait assistance: Supervision Gait Distance (Feet): 100 Feet Assistive device: Rolling walker (2 wheels) Gait Pattern/deviations: Step-through pattern, Decreased stride length Gait velocity: decreased     General Gait Details: cues to stay inside RW   Stairs             Wheelchair Mobility    Modified Rankin (Stroke Patients Only)       Balance                                            Cognition Arousal/Alertness: Awake/alert Behavior During Therapy: WFL for tasks assessed/performed Overall Cognitive Status: Impaired/Different from baseline                                 General Comments: mild memory deficits, cues for safety with RW and hand placement, follows multi-step commands consistently        Exercises General Exercises - Lower Extremity Long Arc Quad: AROM, Strengthening, Both, Seated, 10 reps Hip ABduction/ADduction: AROM, Strengthening, Both, Standing, 10 reps Hip Flexion/Marching: AROM, Strengthening, Both, 10 reps, Standing Heel Raises: AROM, Strengthening, Both, 10 reps, Standing Mini-Sqauts: AROM, 5 reps, Standing    General Comments        Pertinent Vitals/Pain Pain Assessment Pain Assessment: No/denies pain    Home Living  Prior Function            PT Goals (current goals can now be found in the care plan section) Progress towards PT goals: Progressing toward goals    Frequency    Min 3X/week      PT Plan Discharge plan needs to be updated    Co-evaluation              AM-PAC PT "6 Clicks" Mobility   Outcome Measure  Help needed turning from your back to your side while in a flat bed without using bedrails?: None Help needed moving from lying  on your back to sitting on the side of a flat bed without using bedrails?: None Help needed moving to and from a bed to a chair (including a wheelchair)?: A Little Help needed standing up from a chair using your arms (e.g., wheelchair or bedside chair)?: A Little Help needed to walk in hospital room?: A Little Help needed climbing 3-5 steps with a railing? : A Little 6 Click Score: 20    End of Session Equipment Utilized During Treatment: Gait belt Activity Tolerance: Patient tolerated treatment well Patient left: in bed;with call bell/phone within reach;with bed alarm set Nurse Communication: Mobility status PT Visit Diagnosis: Other abnormalities of gait and mobility (R26.89);Muscle weakness (generalized) (M62.81)     Time: 8250-0370 PT Time Calculation (min) (ACUTE ONLY): 32 min  Charges:  $Gait Training: 8-22 mins $Therapeutic Exercise: 8-22 mins                       Lelon Mast 03/27/2022, 11:47 AM

## 2022-03-27 NOTE — Care Management Important Message (Signed)
Important Message  Patient Details IM Letter given. Name: Ariel [name] MRN: [id number] Date of Birth: 02-11-1956   Medicare Important Message Given:  Yes     [name] 03/27/2022, 10:21 AM

## 2022-03-28 ENCOUNTER — Encounter (HOSPITAL_COMMUNITY): Payer: Self-pay | Admitting: Hematology & Oncology

## 2022-03-28 ENCOUNTER — Inpatient Hospital Stay (HOSPITAL_COMMUNITY): Payer: Medicare Other

## 2022-03-28 DIAGNOSIS — C8589 Other specified types of non-Hodgkin lymphoma, extranodal and solid organ sites: Secondary | ICD-10-CM | POA: Diagnosis not present

## 2022-03-28 DIAGNOSIS — R059 Cough, unspecified: Secondary | ICD-10-CM

## 2022-03-28 DIAGNOSIS — K1231 Oral mucositis (ulcerative) due to antineoplastic therapy: Secondary | ICD-10-CM

## 2022-03-28 DIAGNOSIS — E440 Moderate protein-calorie malnutrition: Secondary | ICD-10-CM | POA: Diagnosis present

## 2022-03-28 DIAGNOSIS — Z79899 Other long term (current) drug therapy: Secondary | ICD-10-CM | POA: Diagnosis not present

## 2022-03-28 DIAGNOSIS — C8330 Diffuse large B-cell lymphoma, unspecified site: Secondary | ICD-10-CM | POA: Diagnosis not present

## 2022-03-28 LAB — COMPREHENSIVE METABOLIC PANEL
ALT: 78 U/L — ABNORMAL HIGH (ref 0–44)
AST: 32 U/L (ref 15–41)
Albumin: 3 g/dL — ABNORMAL LOW (ref 3.5–5.0)
Alkaline Phosphatase: 64 U/L (ref 38–126)
Anion gap: 10 (ref 5–15)
BUN: 18 mg/dL (ref 8–23)
CO2: 25 mmol/L (ref 22–32)
Calcium: 9.4 mg/dL (ref 8.9–10.3)
Chloride: 93 mmol/L — ABNORMAL LOW (ref 98–111)
Creatinine, Ser: 0.59 mg/dL (ref 0.44–1.00)
GFR, Estimated: >60 mL/min (ref >60)
Glucose, Bld: 99 mg/dL (ref 70–99)
Potassium: 4.2 mmol/L (ref 3.5–5.1)
Sodium: 128 mmol/L — ABNORMAL LOW (ref 135–145)
Total Bilirubin: 0.3 mg/dL (ref 0.3–1.2)
Total Protein: 6.5 g/dL (ref 6.5–8.1)

## 2022-03-28 LAB — URINALYSIS, COMPLETE (UACMP) WITH MICROSCOPIC
Bilirubin Urine: NEGATIVE
Glucose, UA: NEGATIVE mg/dL
Hgb urine dipstick: NEGATIVE
Ketones, ur: NEGATIVE mg/dL
Leukocytes,Ua: NEGATIVE
Nitrite: NEGATIVE
Protein, ur: 30 mg/dL — AB
Specific Gravity, Urine: 1.013 (ref 1.005–1.030)
pH: 9 — ABNORMAL HIGH (ref 5.0–8.0)

## 2022-03-28 LAB — PHOSPHORUS: Phosphorus: 2.8 mg/dL (ref 2.5–4.6)

## 2022-03-28 LAB — URINALYSIS, W/ REFLEX TO CULTURE (INFECTION SUSPECTED)
Bilirubin Urine: NEGATIVE
Glucose, UA: NEGATIVE mg/dL
Hgb urine dipstick: NEGATIVE
Ketones, ur: NEGATIVE mg/dL
Leukocytes,Ua: NEGATIVE
Nitrite: NEGATIVE
Protein, ur: 30 mg/dL — AB
Specific Gravity, Urine: 1.013 (ref 1.005–1.030)
pH: 8 (ref 5.0–8.0)

## 2022-03-28 LAB — CBC
HCT: 32.4 % — ABNORMAL LOW (ref 36.0–46.0)
Hemoglobin: 11.1 g/dL — ABNORMAL LOW (ref 12.0–15.0)
MCH: 32.4 pg (ref 26.0–34.0)
MCHC: 34.3 g/dL (ref 30.0–36.0)
MCV: 94.5 fL (ref 80.0–100.0)
Platelets: 75 10*3/uL — ABNORMAL LOW (ref 150–400)
RBC: 3.43 MIL/uL — ABNORMAL LOW (ref 3.87–5.11)
RDW: 12.4 % (ref 11.5–15.5)
WBC: 4.2 10*3/uL (ref 4.0–10.5)
nRBC: 0 % (ref 0.0–0.2)

## 2022-03-28 LAB — MAGNESIUM: Magnesium: 2.2 mg/dL (ref 1.7–2.4)

## 2022-03-28 LAB — METHOTREXATE
Methotrexate: 0.15
Methotrexate: 0.06

## 2022-03-28 LAB — GLUCOSE, CAPILLARY: Glucose-Capillary: 104 mg/dL — ABNORMAL HIGH (ref 70–99)

## 2022-03-28 MED ORDER — LEUCOVORIN CALCIUM INJECTION 100 MG
15.0000 mg/m2 | Freq: Three times a day (TID) | INTRAMUSCULAR | Status: AC
  Administered 2022-03-28 – 2022-03-29 (×3): 28 mg via INTRAVENOUS
  Filled 2022-03-28 (×4): qty 1.4

## 2022-03-28 MED ORDER — SODIUM CHLORIDE 1 G PO TABS
1.0000 g | ORAL_TABLET | Freq: Three times a day (TID) | ORAL | Status: AC
  Administered 2022-03-28 – 2022-03-30 (×6): 1 g via ORAL
  Filled 2022-03-28 (×6): qty 1

## 2022-03-28 NOTE — Plan of Care (Signed)
Patient AOX4, VSS throughout shift.  All meds given on time as ordered.  Denied pain.  Diminished lungs, IS encouraged.  Pt voided in bathroom.  POC maintained, will continue to monitor.  Problem: Education: Goal: Knowledge of General Education information will improve Description: Including pain rating scale, medication(s)/side effects and non-pharmacologic comfort measures Outcome: Progressing   Problem: Health Behavior/Discharge Planning: Goal: Ability to manage health-related needs will improve Outcome: Progressing   Problem: Clinical Measurements: Goal: Ability to maintain clinical measurements within normal limits will improve Outcome: Progressing Goal: Will remain free from infection Outcome: Progressing Goal: Diagnostic test results will improve Outcome: Progressing Goal: Respiratory complications will improve Outcome: Progressing Goal: Cardiovascular complication will be avoided Outcome: Progressing   Problem: Activity: Goal: Risk for activity intolerance will decrease Outcome: Progressing   Problem: Nutrition: Goal: Adequate nutrition will be maintained Outcome: Progressing   Problem: Coping: Goal: Level of anxiety will decrease Outcome: Progressing   Problem: Elimination: Goal: Will not experience complications related to bowel motility Outcome: Progressing Goal: Will not experience complications related to urinary retention Outcome: Progressing   Problem: Pain Managment: Goal: General experience of comfort will improve Outcome: Progressing   Problem: Safety: Goal: Ability to remain free from injury will improve Outcome: Progressing   Problem: Skin Integrity: Goal: Risk for impaired skin integrity will decrease Outcome: Progressing   Problem: Education: Goal: Knowledge of disease or condition will improve Outcome: Progressing Goal: Knowledge of secondary prevention will improve (MUST DOCUMENT ALL) Outcome: Progressing Goal: Knowledge of patient  specific risk factors will improve [place] N/A or DELETE if not current risk factor) Outcome: Progressing   Problem: Ischemic Stroke/TIA Tissue Perfusion: Goal: Complications of ischemic stroke/TIA will be minimized Outcome: Progressing   Problem: Coping: Goal: Will verbalize positive feelings about self Outcome: Progressing Goal: Will identify appropriate support needs Outcome: Progressing   Problem: Health Behavior/Discharge Planning: Goal: Ability to manage health-related needs will improve Outcome: Progressing Goal: Goals will be collaboratively established with patient/family Outcome: Progressing   Problem: Self-Care: Goal: Ability to participate in self-care as condition permits will improve Outcome: Progressing Goal: Verbalization of feelings and concerns over difficulty with self-care will improve Outcome: Progressing Goal: Ability to communicate needs accurately will improve Outcome: Progressing   Problem: Nutrition: Goal: Risk of aspiration will decrease Outcome: Progressing Goal: Dietary intake will improve Outcome: Progressing

## 2022-03-28 NOTE — Progress Notes (Signed)
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  CT of the head revealed increased edema in the area for left frontal lobe lesion with no acute hemorrhage or evidence of CVA.  MRI of the brain showed progression of lymphoma in the left frontal operculum.  Oncology following, started in house chemotherapy.  Finished the current cycle of chemotherapy.  Patient and family interested in going to SNF  Assessment & Plan:  Active Problems:   Hyperlipidemia   Melanoma ([address])   High grade B-cell lymphoma (HCC)   Diffuse large B cell lymphoma (HCC)   CNS lymphoma ([place])   Aphasia   Palliative care by specialist   Malnutrition of moderate degree   High-grade non-Hodgkin's lymphoma with brain involvement: Follows with Dr. Marin Olp.  Also follows with radiation oncology, neuro-oncology.  MRI finding as above.  EEG without any evidence of epileptiform discharge.  Started on chemotherapy in 1/23,completed first cycle.On leucovorin, famciclovir, Diflucan Complains of some discomfort in mouth today  Fever/UTI: Currently afebrile.    Urine culture showed pansensitive E. coli.  Currently on keflex,completed  5 days course  Aphasia with right-sided weakness: due to left frontal lobe CNS lymphoma lesion with worsening edema.  On high-dose steroids.Given chemotherapy.  Now much better.  Her speech is clear  Hyponatremia: Suspected to be from sodium bicarb drip with dextrose.  Continue monitoring.  Check BMP tomorrow, started on  sodium chloride tablets  Elevated liver enzymes: Most likely secondary to chemotherapy.  Continue to monitor.  Also has mild thrombocytopenia which could be  associated with malignancy.Almost resolved  Cough: CXR today showed bibasilar subsegmental atelectasis.  Continue to encourage using incentive spirometer  Mucositis: Has some discomfort in the oral cavity.  Continue oral ringe  Generalized weakness: PT/OT consulted.  Patient and family requesting SNF. PT recommended same  Nutrition Problem: Moderate Malnutrition Etiology: chronic illness, cancer and cancer related treatments    DVT prophylaxis:enoxaparin (LOVENOX) injection 40 mg Start: 03/20/22 1700     Code Status: Full Code  Family Communication: Daughter at bedside on 1/29  Patient status:Inpatient  Patient is from :Home  Anticipated discharge to:SNF  Estimated DC date:after oncology clearance,waiting for bed   Consultants: Oncology  Procedures:None  Antimicrobials:  Anti-infectives (From admission, onward)    Start     Dose/Rate Route Frequency Ordered Stop   03/27/22 1000  fluconazole (DIFLUCAN) tablet 100 mg        100 mg Oral Daily 03/27/22 0701     03/25/22 1000  fluconazole (DIFLUCAN) tablet 200 mg  Status:  Discontinued        200 mg Oral Daily 03/25/22 0649 03/27/22 0701   03/25/22 1000  famciclovir (FAMVIR) tablet 500 mg        500 mg Oral Daily 03/25/22 0649     03/23/22 1000  cephALEXin (KEFLEX) capsule 500 mg        500 mg Oral Every 12 hours 03/22/22 1056 03/27/22 2116   03/22/22 0800  ciprofloxacin (CIPRO) tablet 500 mg  Status:  Discontinued        500 mg Oral 2 times daily 03/22/22 0635 03/22/22 0703   03/22/22 0800  cefTRIAXone (ROCEPHIN) 1 g in sodium chloride 0.9 % 100 mL  IVPB  Status:  Discontinued        1 g 200 mL/hr over 30 Minutes Intravenous Every 24 hours 03/22/22 0703 03/22/22 1056       Subjective:   Patient seen and examined at bedside today.  Hemodynamically stable.  She looks little uncomfortable today.  She has some discomfort on the inside the mouth.  No nausea or vomiting or abdominal pain.wants to  rest  Objective: Vitals:   03/27/22 0622 03/27/22 1409 03/27/22 2232 03/28/22 0504  BP: (!) 129/52 114/62 (!) 143/62 139/68  Pulse: 80 89 87 88  Resp: '17 17 17 17  '$ Temp: 99.1 F (37.3 C) 98.5 F (36.9 C) 98.9 F (37.2 C) 100.3 F (37.9 C)  TempSrc: Oral Oral Oral Oral  SpO2: 97% 97% 99% 96%  Weight: 61.1 kg     Height:        Intake/Output Summary (Last 24 hours) at 03/28/2022 1346 Last data filed at 03/28/2022 0200 Gross per 24 hour  Intake 372.12 ml  Output 1800 ml  Net -1427.88 ml   Filed Weights   03/17/22 1000 03/17/22 1823 03/27/22 0622  Weight: 69 kg 69 kg 61.1 kg    Examination:   General exam: Appears weak, lying in bed HEENT: Some erythematous areas inside  the mouth without clear ulcers Respiratory system:  no wheezes or crackles  Cardiovascular system: S1 & S2 heard, RRR.  Gastrointestinal system: Abdomen is nondistended, soft and nontender. Central nervous system: Alert and oriented Extremities: No edema, no clubbing ,no cyanosis Skin: No rashes, no ulcers,no icterus     Data Reviewed: I have personally reviewed following labs and imaging studies  CBC: Recent Labs  Lab 03/23/22 0543 03/24/22 0610 03/25/22 0530 03/26/22 0520 03/27/22 0600 03/28/22 0847  WBC 7.1 8.3 8.2 7.6 7.2 4.2  NEUTROABS 5.4 6.9 7.4 7.1 6.7  --   HGB 12.1 11.9* 11.4* 11.6* 11.1* 11.1*  HCT 34.4* 32.5* 32.1* 33.0* 32.4* 32.4*  MCV 92.0 88.6 90.2 91.4 93.4 94.5  PLT 114* 108* 100* 102* 95* 75*   Basic Metabolic Panel: Recent Labs  Lab 03/24/22 0610 03/25/22 0530 03/26/22 0520 03/27/22 0600 03/28/22 0500  NA 129* 131* 133* 127* 128*  K 3.6 3.6 3.5 4.2 4.2  CL 91* 93* 93* 90* 93*  CO2 '27 28 29 27 25  '$ GLUCOSE 126* 141* 130* 121* 99  BUN '22 21 22 17 18  '$ CREATININE 0.57 0.63 0.79 0.75 0.59  CALCIUM 8.9 9.1 9.0 9.0 9.4  MG 2.2 2.5* 2.4 2.2 2.2  PHOS 3.0 2.6 2.6 2.3* 2.8     Recent Results (from the past 240 hour(s))  Urine Culture     Status: Abnormal    Collection Time: 03/20/22  7:05 AM   Specimen: Urine, Catheterized  Result Value Ref Range Status   Specimen Description   Final    URINE, CATHETERIZED Performed at [facility], [place] [address], [place], [place] [address]    Special Requests   Final    Immunocompromised Performed at [facility], [place] [address] [place], [place] [address]    Culture >=100,000 COLONIES/mL ESCHERICHIA COLI (A)  Final   Report Status 03/22/2022 FINAL  Final   Organism ID, Bacteria ESCHERICHIA COLI (A)  Final      Susceptibility   Escherichia coli - MIC*    AMPICILLIN <=2 SENSITIVE Sensitive     CEFAZOLIN <=4 SENSITIVE Sensitive     CEFEPIME <=0.12 SENSITIVE Sensitive     CEFTRIAXONE <=  0.25 SENSITIVE Sensitive     CIPROFLOXACIN <=0.25 SENSITIVE Sensitive     GENTAMICIN <=1 SENSITIVE Sensitive     IMIPENEM <=0.25 SENSITIVE Sensitive     NITROFURANTOIN <=16 SENSITIVE Sensitive     TRIMETH/SULFA <=20 SENSITIVE Sensitive     AMPICILLIN/SULBACTAM <=2 SENSITIVE Sensitive     PIP/TAZO <=4 SENSITIVE Sensitive     * >=100,000 COLONIES/mL ESCHERICHIA COLI     Radiology Studies: DG Chest 2 View  Result Date: 03/28/2022 CLINICAL DATA:  Cough. EXAM: CHEST - 2 VIEW COMPARISON:  March 17, 2022.  March 10, 2022. FINDINGS: The heart size and mediastinal contours are within normal limits. Right internal jugular Port-A-Cath is unchanged in position. Minimal bibasilar subsegmental atelectasis. The visualized skeletal structures are unremarkable. IMPRESSION: Minimal bibasilar subsegmental atelectasis. Electronically Signed   By: Marijo Conception M.D.   On: 03/28/2022 11:36    Scheduled Meds:  antiseptic oral rinse  15 mL Mouth Rinse Q6H   Chlorhexidine Gluconate Cloth  6 each Topical Daily   dexamethasone (DECADRON) injection  4 mg Intravenous Daily   enoxaparin (LOVENOX) injection  40 mg Subcutaneous Q24H   famciclovir  500 mg Oral Daily   famotidine  20 mg Oral Daily    feeding supplement  237 mL Oral TID BM   fluconazole  100 mg Oral Daily   leucovorin  15 mg/m2 (Treatment Plan Recorded) Intravenous Q8H   multivitamin with minerals  1 tablet Oral Daily   naphazoline-glycerin  2 drop Both Eyes Q8H   OLANZapine  5 mg Oral QHS   sodium bicarbonate/sodium chloride   Mouth Rinse Q4H   sodium chloride flush  10-40 mL Intracatheter Q12H   sodium chloride  1 g Oral TID WC   Continuous Infusions:     LOS: 11 days   Shelly Coss, MD [facility] P1/30/2024, 1:46 PM

## 2022-03-28 NOTE — Progress Notes (Signed)
Thankfully, the methotrexate level has been going down.  Yesterday, the methotrexate level was 0.08.  She is now off the leucovorin.  She is off the bicarb infusion.  The problem that she has that she is trying develop some mucositis in the oral cavity.  She said is somewhat painful.  She is rinsing with Biotene and sodium bicarbonate.  She also has a cough.  It is a dry cough.  I will have to get a chest x-ray on her.  This might be from the methotrexate that she received.  I do want to check another urine on her.  She did have E. coli in the urine.  She had little bit of a temperature this morning 100.3.  Her CBC is not back yet.  Her chemistries show sodium 128.  Potassium 4.2.  BUN 18 creatinine 0.59.  Her phosphorus is 2.8.  Albumin 3.0.  SG PT is 78.  SGOT is 32.  I do appreciate our case manager helping out with placement for skilled nursing.  I know that physical therapy and Occupational Therapy have also been working with her.  I want to make sure she also has an incentive spirometer to try to help with her lungs.  Is hard to say when she is eating because of the soreness with her mouth.  I do not see any thrush.  She is on Diflucan and Famvir as a prophylactic measure.  There is no bleeding.  She denies any current headache.  There is no visual changes.  Her vital signs are temperature of 100.3.  Pulse 88.  Blood pressure 139/68.  Her head and neck exam shows some irritation in the oral cavity.  There may be some small areas of breakdown.  She has little bit of mucositis on the lower lips.  Lungs are clear bilaterally.  Cardiac exam regular rate rhythm.  Abdomen is soft.  Bowel sounds are present.  She has no fluid wave.  Extremity shows no clubbing, cyanosis or edema.  Neurological exam is nonfocal.  Ms. Rosman is dealing with some of the side effects of treatment.  Again the methotrexate level yesterday was 0.08.  This should be low enough that she should not have issues with  toxicity.  I feel bad that she does have this mucositis.  We will have to be aggressive with treating this.  I will get a chest x-ray on her.  It will be a couple days before she, I think I will be ready for discharge.  I really need to see what her CBC shows.  I do appreciate the great care she is getting from everybody on [facility].   [name], MD  [place] 12:12

## 2022-03-28 NOTE — Progress Notes (Signed)
Pharmacy Note: Methotrexate Level Update  MTX level drawn at 0518 this AM was 0.06. Results given to Dr. Marin Olp. Per MD, continue Leucovorin '15mg'$ /m2 IV q8h.   Next MTX level ordered for tomorrow AM, 03/29/22.   Lindell Spar, PharmD, BCPS Clinical Pharmacist 03/28/2022 10:15 AM

## 2022-03-28 NOTE — TOC Progression Note (Signed)
Transition of Care [facility]) - Progression Note    Patient Details  Name: Ariel [name] MRN: [id number] Date of Birth: 03-Dec-1955  [facility]) CM/SW [place], RN Phone Number:[phone]  03/28/2022, 3:59 PM  Clinical Narrative:    CM reached out to Whitney at [facility] with no answer. Voicemail has been left. Will continue to follow for bed offers.    Expected Discharge Plan: [place] Barriers to Discharge: Continued Medical Work up  Expected Discharge Plan and Services In-house Referral: NA Discharge Planning Services: CM Consult Post Acute Care Choice: [place] Living arrangements for the past 2 months: Single Family Home                 DME Arranged: N/A DME Agency: NA       HH Arranged: NA HH Agency: NA         Social Determinants of Health (SDOH) Interventions SDOH Screenings   Food Insecurity: No Food Insecurity (03/18/2022)  Housing: Low Risk  (03/18/2022)  Transportation Needs: No Transportation Needs (03/18/2022)  Utilities: Not At Risk (03/18/2022)  Alcohol Screen: Low Risk  (07/08/2020)  Depression (PHQ2-9): Low Risk  (07/13/2021)  Financial Resource Strain: Low Risk  (07/13/2021)  Physical Activity: Insufficiently Active (07/13/2021)  Social Connections: Socially Isolated (07/13/2021)  Stress: No Stress Concern Present (07/13/2021)  Tobacco Use: Low Risk  (03/17/2022)    Readmission Risk Interventions    03/27/2022    3:00 PM 08/02/2021    3:11 PM 07/05/2021    2:17 PM  Readmission Risk Prevention Plan  Transportation Screening Complete Complete Complete  Medication Review Press photographer) Complete Complete Complete  PCP or Specialist appointment within 3-5 days of discharge Complete Complete Complete  HRI or [place] Complete Complete Complete  SW Recovery Care/Counseling Consult Complete Complete Complete  Palliative Care Screening Complete Not Applicable Not [place] Complete Not Applicable Not Applicable

## 2022-03-29 ENCOUNTER — Encounter (HOSPITAL_COMMUNITY): Payer: Self-pay | Admitting: Hematology & Oncology

## 2022-03-29 ENCOUNTER — Other Ambulatory Visit: Payer: Self-pay

## 2022-03-29 DIAGNOSIS — N390 Urinary tract infection, site not specified: Secondary | ICD-10-CM | POA: Diagnosis not present

## 2022-03-29 DIAGNOSIS — C8330 Diffuse large B-cell lymphoma, unspecified site: Secondary | ICD-10-CM | POA: Diagnosis not present

## 2022-03-29 DIAGNOSIS — C8589 Other specified types of non-Hodgkin lymphoma, extranodal and solid organ sites: Secondary | ICD-10-CM | POA: Diagnosis not present

## 2022-03-29 DIAGNOSIS — Z79899 Other long term (current) drug therapy: Secondary | ICD-10-CM | POA: Diagnosis not present

## 2022-03-29 DIAGNOSIS — K1231 Oral mucositis (ulcerative) due to antineoplastic therapy: Secondary | ICD-10-CM | POA: Diagnosis not present

## 2022-03-29 LAB — CBC WITH DIFFERENTIAL/PLATELET
Abs Immature Granulocytes: 0.06 10*3/uL (ref 0.00–0.07)
Basophils Absolute: 0 10*3/uL (ref 0.0–0.1)
Basophils Relative: 0 %
Eosinophils Absolute: 0.1 10*3/uL (ref 0.0–0.5)
Eosinophils Relative: 2 %
HCT: 33.1 % — ABNORMAL LOW (ref 36.0–46.0)
Hemoglobin: 11.3 g/dL — ABNORMAL LOW (ref 12.0–15.0)
Immature Granulocytes: 1 %
Lymphocytes Relative: 8 %
Lymphs Abs: 0.4 10*3/uL — ABNORMAL LOW (ref 0.7–4.0)
MCH: 32.2 pg (ref 26.0–34.0)
MCHC: 34.1 g/dL (ref 30.0–36.0)
MCV: 94.3 fL (ref 80.0–100.0)
Monocytes Absolute: 0 10*3/uL — ABNORMAL LOW (ref 0.1–1.0)
Monocytes Relative: 1 %
Neutro Abs: 4.8 10*3/uL (ref 1.7–7.7)
Neutrophils Relative %: 88 %
Platelets: 67 10*3/uL — ABNORMAL LOW (ref 150–400)
RBC: 3.51 MIL/uL — ABNORMAL LOW (ref 3.87–5.11)
RDW: 12.5 % (ref 11.5–15.5)
WBC: 5.4 10*3/uL (ref 4.0–10.5)
nRBC: 0 % (ref 0.0–0.2)

## 2022-03-29 LAB — PHOSPHORUS: Phosphorus: 2.7 mg/dL (ref 2.5–4.6)

## 2022-03-29 LAB — URINALYSIS, DIPSTICK ONLY
Bilirubin Urine: NEGATIVE
Glucose, UA: NEGATIVE mg/dL
Ketones, ur: NEGATIVE mg/dL
Nitrite: NEGATIVE
Protein, ur: 30 mg/dL — AB
Specific Gravity, Urine: 1.02 (ref 1.005–1.030)
pH: 6 (ref 5.0–8.0)

## 2022-03-29 LAB — COMPREHENSIVE METABOLIC PANEL
ALT: 62 U/L — ABNORMAL HIGH (ref 0–44)
AST: 32 U/L (ref 15–41)
Albumin: 2.9 g/dL — ABNORMAL LOW (ref 3.5–5.0)
Alkaline Phosphatase: 62 U/L (ref 38–126)
Anion gap: 11 (ref 5–15)
BUN: 21 mg/dL (ref 8–23)
CO2: 22 mmol/L (ref 22–32)
Calcium: 8.7 mg/dL — ABNORMAL LOW (ref 8.9–10.3)
Chloride: 94 mmol/L — ABNORMAL LOW (ref 98–111)
Creatinine, Ser: 0.62 mg/dL (ref 0.44–1.00)
GFR, Estimated: >60 mL/min (ref >60)
Glucose, Bld: 135 mg/dL — ABNORMAL HIGH (ref 70–99)
Potassium: 3.6 mmol/L (ref 3.5–5.1)
Sodium: 127 mmol/L — ABNORMAL LOW (ref 135–145)
Total Bilirubin: 0.4 mg/dL (ref 0.3–1.2)
Total Protein: 6.5 g/dL (ref 6.5–8.1)

## 2022-03-29 LAB — METHOTREXATE: Methotrexate: <0.05

## 2022-03-29 LAB — MAGNESIUM: Magnesium: 2.1 mg/dL (ref 1.7–2.4)

## 2022-03-29 MED ORDER — MAGIC MOUTHWASH W/LIDOCAINE
10.0000 mL | Freq: Four times a day (QID) | ORAL | Status: DC
  Administered 2022-03-29 (×4): 10 mL via ORAL
  Filled 2022-03-29 (×12): qty 10

## 2022-03-29 MED ORDER — SODIUM CHLORIDE 0.9 % IV SOLN
2.0000 g | Freq: Three times a day (TID) | INTRAVENOUS | Status: AC
  Administered 2022-03-29 – 2022-04-04 (×20): 2 g via INTRAVENOUS
  Filled 2022-03-29 (×20): qty 12.5

## 2022-03-29 MED ORDER — SODIUM CHLORIDE 0.9 % IV SOLN: INTRAVENOUS | Status: DC

## 2022-03-29 NOTE — Progress Notes (Signed)
Mobility Specialist - Progress Note   03/29/22 1511  Mobility  Activity Ambulated with assistance in room  Level of Assistance Standby assist, set-up cues, supervision of patient - no hands on  Assistive Device Front wheel walker  Distance Ambulated (ft) 60 ft  Range of Motion/Exercises Active  Activity Response Tolerated well  Mobility Referral Yes  $Mobility charge 1 Mobility   Pt was found in bed and agreeable to ambulate in room due to still feeling fatigued. Pt had no complaints during ambulation and at EOS returned to bed with all necessities in reach. NT notified.  Ferd Hibbs Mobility Specialist

## 2022-03-29 NOTE — Progress Notes (Signed)
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  CT of the head revealed increased edema in the area for left frontal lobe lesion with no acute hemorrhage or evidence of CVA.  MRI of the brain showed progression of lymphoma in the left frontal operculum.  Oncology following, started in house chemotherapy.  Finished the current cycle of chemotherapy.  Patient and family interested in going to SNF.  Developed fever today.  Assessment & Plan:  Active Problems:   Hyperlipidemia   Melanoma ([place])   High grade B-cell lymphoma (HCC)   Diffuse large B cell lymphoma (HCC)   CNS lymphoma ([place])   Aphasia   Palliative care by specialist   Malnutrition of moderate degree   High-grade non-Hodgkin's lymphoma with brain involvement: Follows with Dr. Marin Olp.  Also follows with radiation oncology, neuro-oncology.  MRI finding as above.  EEG without any evidence of epileptiform discharge.  Started on chemotherapy in 1/23,completed first cycle.On leucovorin, famciclovir, Diflucan Complains of some discomfort in mouth today likely from mucositis  Fever/UTI: Spiked  temperature of 102 F today.suspicion for development of febrile neutropenia but CBC showed stable WBC count.  Empirically started on cefepime.  Blood culture obtained.  Earlier, Urine culture showed pansensitive E. coli.  completed  5 days course of keflex  Aphasia with right-sided weakness: due to left frontal lobe CNS lymphoma lesion with worsening edema.  On high-dose steroids.Given chemotherapy.  Now much better.  Her speech is clear  Hyponatremia: Suspected to be from sodium bicarb drip with dextrose.  Continue monitoring.   Check BMP tomorrow, started on  sodium chloride tablets,iv fluids with NS  Elevated liver enzymes: Most likely secondary to chemotherapy. Improved.  Also has mild thrombocytopenia which could be associated with malignancy/chemo  Cough: CXR  showed bibasilar subsegmental atelectasis.  Continue to encourage using incentive spirometer.remains on room air  Mucositis: Has some discomfort in the oral cavity.  Continue oral ringe  Generalized weakness: PT/OT consulted.  Patient and family requesting SNF. PT recommended same  Nutrition Problem: Moderate Malnutrition Etiology: chronic illness, cancer and cancer related treatments    DVT prophylaxis:enoxaparin (LOVENOX) injection 40 mg Start: 03/20/22 1700     Code Status: Full Code  Family Communication: Daughter at bedside on 1/29  Patient status:Inpatient  Patient is from :Home  Anticipated discharge to:SNF  Estimated DC date:after oncology clearance,waiting for bed   Consultants: Oncology  Procedures:None  Antimicrobials:  Anti-infectives (From admission, onward)    Start     Dose/Rate Route Frequency Ordered Stop   03/29/22 0800  ceFEPIme (MAXIPIME) 2 g in sodium chloride 0.9 % 100 mL IVPB        2 g 200 mL/hr over 30 Minutes Intravenous Every 8 hours 03/29/22 0727     03/27/22 1000  fluconazole (DIFLUCAN) tablet 100 mg        100 mg Oral Daily 03/27/22 0701     03/25/22 1000  fluconazole (DIFLUCAN) tablet 200 mg  Status:  Discontinued        200 mg Oral Daily 03/25/22 0649 03/27/22 0701   03/25/22 1000  famciclovir (FAMVIR) tablet 500 mg        500 mg Oral Daily 03/25/22 0649  03/23/22 1000  cephALEXin (KEFLEX) capsule 500 mg        500 mg Oral Every 12 hours 03/22/22 1056 03/27/22 2116   03/22/22 0800  ciprofloxacin (CIPRO) tablet 500 mg  Status:  Discontinued        500 mg Oral 2 times daily 03/22/22 0635 03/22/22 0703   03/22/22 0800  cefTRIAXone (ROCEPHIN) 1 g in sodium chloride 0.9 % 100 mL IVPB  Status:   Discontinued        1 g 200 mL/hr over 30 Minutes Intravenous Every 24 hours 03/22/22 0703 03/22/22 1056       Subjective:   Patient seen and examined at bedside today.  Spiked fever of 102 F this morning.  During my evaluation she looks more comfortable than yesterday.  Denies any nausea, vomiting or abdominal pain.  Still has some discomfort inside the mouth.  Objective: Vitals:   03/28/22 2029 03/29/22 0651 03/29/22 0813 03/29/22 1139  BP: 119/69 (!) 136/49 124/62 (!) 110/59  Pulse: 77 89 93 94  Resp: '17 17 20 20  '$ Temp: 98.5 F (36.9 C) (!) 102 F (38.9 C) 99.7 F (37.6 C) 99.6 F (37.6 C)  TempSrc: Oral Oral Oral Oral  SpO2: 96% 98% 97% 97%  Weight:      Height:        Intake/Output Summary (Last 24 hours) at 03/29/2022 1305 Last data filed at 03/29/2022 1256 Gross per 24 hour  Intake 440 ml  Output 1500 ml  Net -1060 ml   Filed Weights   03/17/22 1000 03/17/22 1823 03/27/22 0622  Weight: 69 kg 69 kg 61.1 kg    Examination:   General exam: Overall comfortable, not in distress,appears weak HEENT: PERRL,Some erythematous areas inside  the mouth without clear ulcers Respiratory system:  no wheezes or crackles  Cardiovascular system: S1 & S2 heard, RRR.  Gastrointestinal system: Abdomen is nondistended, soft and nontender. Central nervous system: Alert and oriented Extremities: No edema, no clubbing ,no cyanosis Skin: No rashes, no ulcers,no icterus     Data Reviewed: I have personally reviewed following labs and imaging studies  CBC: Recent Labs  Lab 03/24/22 0610 03/25/22 0530 03/26/22 0520 03/27/22 0600 03/28/22 0847 03/29/22 0846  WBC 8.3 8.2 7.6 7.2 4.2 5.4  NEUTROABS 6.9 7.4 7.1 6.7  --  4.8  HGB 11.9* 11.4* 11.6* 11.1* 11.1* 11.3*  HCT 32.5* 32.1* 33.0* 32.4* 32.4* 33.1*  MCV 88.6 90.2 91.4 93.4 94.5 94.3  PLT 108* 100* 102* 95* 75* 67*   Basic Metabolic Panel: Recent Labs  Lab 03/25/22 0530 03/26/22 0520 03/27/22 0600 03/28/22 0500  03/29/22 0500  NA 131* 133* 127* 128* 127*  K 3.6 3.5 4.2 4.2 3.6  CL 93* 93* 90* 93* 94*  CO2 '28 29 27 25 22  '$ GLUCOSE 141* 130* 121* 99 135*  BUN '21 22 17 18 21  '$ CREATININE 0.63 0.79 0.75 0.59 0.62  CALCIUM 9.1 9.0 9.0 9.4 8.7*  MG 2.5* 2.4 2.2 2.2 2.1  PHOS 2.6 2.6 2.3* 2.8 2.7     Recent Results (from the past 240 hour(s))  Urine Culture     Status: Abnormal   Collection Time: 03/20/22  7:05 AM   Specimen: Urine, Catheterized  Result Value Ref Range Status   Specimen Description   Final    URINE, CATHETERIZED Performed at [facility], [place] [address], [place], [place] [address]    Special Requests   Final    Immunocompromised Performed at [facility]  [facility], [place] [address], [place], [place] [address]    Culture >=100,000 COLONIES/mL ESCHERICHIA COLI (A)  Final   Report Status 03/22/2022 FINAL  Final   Organism ID, Bacteria ESCHERICHIA COLI (A)  Final      Susceptibility   Escherichia coli - MIC*    AMPICILLIN <=2 SENSITIVE Sensitive     CEFAZOLIN <=4 SENSITIVE Sensitive     CEFEPIME <=0.12 SENSITIVE Sensitive     CEFTRIAXONE <=0.25 SENSITIVE Sensitive     CIPROFLOXACIN <=0.25 SENSITIVE Sensitive     GENTAMICIN <=1 SENSITIVE Sensitive     IMIPENEM <=0.25 SENSITIVE Sensitive     NITROFURANTOIN <=16 SENSITIVE Sensitive     TRIMETH/SULFA <=20 SENSITIVE Sensitive     AMPICILLIN/SULBACTAM <=2 SENSITIVE Sensitive     PIP/TAZO <=4 SENSITIVE Sensitive     * >=100,000 COLONIES/mL ESCHERICHIA COLI     Radiology Studies: DG Chest 2 View  Result Date: 03/28/2022 CLINICAL DATA:  Cough. EXAM: CHEST - 2 VIEW COMPARISON:  March 17, 2022.  March 10, 2022. FINDINGS: The heart size and mediastinal contours are within normal limits. Right internal jugular Port-A-Cath is unchanged in position. Minimal bibasilar subsegmental atelectasis. The visualized skeletal structures are unremarkable. IMPRESSION: Minimal bibasilar subsegmental atelectasis.  Electronically Signed   By: Marijo Conception M.D.   On: 03/28/2022 11:36    Scheduled Meds:  antiseptic oral rinse  15 mL Mouth Rinse Q6H   Chlorhexidine Gluconate Cloth  6 each Topical Daily   dexamethasone (DECADRON) injection  4 mg Intravenous Daily   enoxaparin (LOVENOX) injection  40 mg Subcutaneous Q24H   famciclovir  500 mg Oral Daily   famotidine  20 mg Oral Daily   feeding supplement  237 mL Oral TID BM   fluconazole  100 mg Oral Daily   magic mouthwash w/lidocaine  10 mL Oral QID   multivitamin with minerals  1 tablet Oral Daily   naphazoline-glycerin  2 drop Both Eyes Q8H   OLANZapine  5 mg Oral QHS   sodium bicarbonate/sodium chloride   Mouth Rinse Q4H   sodium chloride flush  10-40 mL Intracatheter Q12H   sodium chloride  1 g Oral TID WC   Continuous Infusions:  ceFEPime (MAXIPIME) IV 2 g (03/29/22 0951)      LOS: 12 days   Shelly Coss, MD [facility] P1/31/2024, 1:05 PM

## 2022-03-29 NOTE — Plan of Care (Addendum)
Patient AOX4, VSS throughout shift. Tmax 102 this morning, provider notified and tylenol given.  Ice packs placed under arms.  All meds given on time as ordered. Denied pain. Diminished lungs, IS encouraged. Persistent cough.  Pt voided in bathroom. POC maintained, will continue to monitor.  Problem: Education: Goal: Knowledge of General Education information will improve Description: Including pain rating scale, medication(s)/side effects and non-pharmacologic comfort measures Outcome: Progressing   Problem: Health Behavior/Discharge Planning: Goal: Ability to manage health-related needs will improve Outcome: Progressing   Problem: Clinical Measurements: Goal: Ability to maintain clinical measurements within normal limits will improve Outcome: Progressing Goal: Will remain free from infection Outcome: Progressing Goal: Diagnostic test results will improve Outcome: Progressing Goal: Respiratory complications will improve Outcome: Progressing Goal: Cardiovascular complication will be avoided Outcome: Progressing   Problem: Activity: Goal: Risk for activity intolerance will decrease Outcome: Progressing   Problem: Nutrition: Goal: Adequate nutrition will be maintained Outcome: Progressing   Problem: Coping: Goal: Level of anxiety will decrease Outcome: Progressing   Problem: Elimination: Goal: Will not experience complications related to bowel motility Outcome: Progressing Goal: Will not experience complications related to urinary retention Outcome: Progressing   Problem: Pain Managment: Goal: General experience of comfort will improve Outcome: Progressing   Problem: Safety: Goal: Ability to remain free from injury will improve Outcome: Progressing   Problem: Skin Integrity: Goal: Risk for impaired skin integrity will decrease Outcome: Progressing   Problem: Education: Goal: Knowledge of disease or condition will improve Outcome: Progressing Goal: Knowledge of  secondary prevention will improve (MUST DOCUMENT ALL) Outcome: Progressing Goal: Knowledge of patient specific risk factors will improve [place]ta [place] N/A or DELETE if not current risk factor) Outcome: Progressing   Problem: Ischemic Stroke/TIA Tissue Perfusion: Goal: Complications of ischemic stroke/TIA will be minimized Outcome: Progressing   Problem: Coping: Goal: Will verbalize positive feelings about self Outcome: Progressing Goal: Will identify appropriate support needs Outcome: Progressing   Problem: Health Behavior/Discharge Planning: Goal: Ability to manage health-related needs will improve Outcome: Progressing Goal: Goals will be collaboratively established with patient/family Outcome: Progressing   Problem: Self-Care: Goal: Ability to participate in self-care as condition permits will improve Outcome: Progressing Goal: Verbalization of feelings and concerns over difficulty with self-care will improve Outcome: Progressing Goal: Ability to communicate needs accurately will improve Outcome: Progressing   Problem: Nutrition: Goal: Risk of aspiration will decrease Outcome: Progressing Goal: Dietary intake will improve Outcome: Progressing

## 2022-03-29 NOTE — Progress Notes (Signed)
PT Cancellation Note  Patient Details Name: Ariel [name] MRN: [id number] DOB: 1955/03/02   Cancelled Treatment:    Reason Eval/Treat Not Completed: Fatigue/lethargy limiting ability to participate, hs been up with OT, in recliner, now returned to bed.  [place] Office [phone] Weekend [phone]    Claretha Cooper 03/29/2022, 11:37 AM

## 2022-03-29 NOTE — TOC Progression Note (Addendum)
Transition of Care [facility]) - Progression Note    Patient Details  Name: Ariel [name] MRN: [id number] Date of Birth: [birth year]  Transition of Care [facility]) CM/SW [place], RN Phone [phone]  03/29/2022, 10:16 AM  Clinical Narrative:    CM received call from [facility] with admissions at [facility] Facility can accept patient for short term SNF bed but patient will have a $225/day co pay from insurance plus a $43 per day private co pay. Loree Fee states that she does not have any long term SNF beds but that family has been in touch with marketing for Independent living beds. CM made Whitney aware that family only discussed SNF bed with Cm. Whitney to follow up with family and update CM.   [id number] CM received call from Christopher with admissions at [facility]. Per Loree Fee she had conversation with patients daughter and daughter has declined [facility] offer due to co pay cost and not wanting to move her mother around to another facility after SNF days are exhausted. CM will follow up with patient and daughter.    Expected Discharge Plan: [place] Barriers to Discharge: Continued Medical Work up  Expected Discharge Plan and Services In-house Referral: NA Discharge Planning Services: CM Consult Post Acute Care Choice: [place] Living arrangements for the past 2 months: Single Family Home                 DME Arranged: N/A DME Agency: NA       HH Arranged: NA HH Agency: NA         Social Determinants of Health (SDOH) Interventions SDOH Screenings   Food Insecurity: No Food Insecurity (03/18/2022)  Housing: Low Risk  (03/18/2022)  Transportation Needs: No Transportation Needs (03/18/2022)  Utilities: Not At Risk (03/18/2022)  Alcohol Screen: Low Risk  (07/08/2020)  Depression (PHQ2-9): Low Risk  (07/13/2021)  Financial Resource Strain: Low Risk  (07/13/2021)  Physical Activity: Insufficiently Active (07/13/2021)  Social Connections: Socially  Isolated (07/13/2021)  Stress: No Stress Concern Present (07/13/2021)  Tobacco Use: Low Risk  (03/17/2022)    Readmission Risk Interventions    03/27/2022    3:00 PM 08/02/2021    3:11 PM 07/05/2021    2:17 PM  Readmission Risk Prevention Plan  Transportation Screening Complete Complete Complete  Medication Review Press photographer) Complete Complete Complete  PCP or Specialist appointment within 3-5 days of discharge Complete Complete Complete  HRI or [place] Complete Complete Complete  SW Recovery Care/Counseling Consult Complete Complete Complete  Palliative Care Screening Complete Not Applicable Not Story Complete Not Applicable Not Applicable

## 2022-03-29 NOTE — Progress Notes (Signed)
Unfortunately, she is beginning to develop quite a bit of mucositis.  Had believe this is all from the methotrexate.  Her levels certainly are not bad.  She is on the leucovorin rescue.  I do not have her CBC back yet.  Her LFTs look good.  Renal function looks fine.  Her sodium is 127.  Chloride 94.  I really think she needs some IV fluids.  We will try her on some Magic mouthwash.  Will see if this may help with some of the healing.  She is having a hard time eating because of the mucositis.  She had a chest x-ray yesterday which may have shown a little bit of infiltrate/atelectasis.  She is has a cough.  She had temperature of 102 this morning.  Again, we really need to see what her blood count is.  I probably get her on antibiotics.  We did check a urinalysis on her yesterday.  This seems to improve.  She had a E. coli UTI.  She has had no nausea or vomiting.  There has been no diarrhea.  She still has this cough.  She has had no bleeding.  There is no headache.  There is no visual changes.  On exam, temperature is 102.  Pulse 89.  Blood pressure 136/49.  Her oral exam does show mucositis.  This is mostly on the buccal mucosa.  Her lungs are clear bilaterally.  She has good air movement bilaterally.  Cardiac exam regular rate and rhythm.  Abdomen is soft.  Bowel sounds are present.  There is no guarding or rebound tenderness.  Extremities shows no clubbing, cyanosis or edema.  Neurological exam is nonfocal.  We run into problems with some toxicity from the chemotherapy protocol.  I am a little bit surprised by this because she really never had problems when we treated her last year.  Again, I think we need to get some antibiotics.  I have a sense that she will become neutropenic.  Once I see her white blood cell count, we may need to start her on Neupogen.  She is in no shape to go to skilled living.  I suspect that we are still looking at several more days before we can think about  that her leave.  I do appreciate the incredible care she is getting from everybody upon 6 E.  [name], MD  Darlyn Chamber 17:14

## 2022-03-29 NOTE — Progress Notes (Signed)
Occupational Therapy Treatment Patient Details Name: Ariel [name] MRN: [id number] DOB: 04-01-1955 Today's Date: 03/29/2022   History of present illness patient is a 67 year old white female presented 03/17/22 with  right facial droop, dysarthria, aphasia. MRI showed recurrence non-Hodgkin's lymphoma in left frontal lymphoma lesion PMH-CNS lymphoma, melanoma of the skin, high-grade B-cell lymphoma   OT comments  Patient found supine in bed. She is able to manage disconnecting the purewick. She can transfer herself to the side of the bed. She is min guard for ambulation in room with walker. She reports no need for ADLs during treatment but agreeable to get out of bed. Once perched at edge of recliner therapist gave her a three step task to perform - in which she had to stand using her hands to help and fetch two things in two different places. She is mostly focused on the pain in her mouth and her poor nutrition.   Recommendations for follow up therapy are one component of a multi-disciplinary discharge planning process, led by the attending physician.  Recommendations may be updated based on patient status, additional functional criteria and insurance authorization.    Follow Up Recommendations  Skilled nursing-short term rehab (<3 hours/day)     Assistance Recommended at Discharge Intermittent Supervision/Assistance  Patient can return home with the following  A little help with bathing/dressing/bathroom;Assistance with cooking/housework;Direct supervision/assist for medications management;Direct supervision/assist for financial management;Assist for transportation;Help with stairs or ramp for entrance   Equipment Recommendations  None recommended by OT    Recommendations for Other Services      Precautions / Restrictions Precautions Precautions: Fall Restrictions Weight Bearing Restrictions: No       Mobility Bed Mobility Overal bed mobility: Needs Assistance Bed Mobility: Supine  to Sit     Supine to sit: Supervision          Transfers Overall transfer level: Needs assistance Equipment used: Rolling walker (2 wheels) Transfers: Sit to/from Stand Sit to Stand: Min guard           General transfer comment: Min guard A for safety     Balance Overall balance assessment: Needs assistance Sitting-balance support: No upper extremity supported, Feet supported Sitting balance-Leahy Scale: Good     Standing balance support: No upper extremity supported Standing balance-Leahy Scale: Fair                             ADL either performed or assessed with clinical judgement   ADL Overall ADL's : Needs assistance/impaired                       Lower Body Dressing Details (indicate cue type and reason): needed assistance to don socks due to hx of hip surgies - no devices in room.             Functional mobility during ADLs: Min guard;Rolling walker (2 wheels) General ADL Comments: patient supervision to transfer to side of bed  and min guard for ambulation in room.    Extremity/Trunk Assessment Upper Extremity Assessment Upper Extremity Assessment: Generalized weakness   Lower Extremity Assessment Lower Extremity Assessment: Generalized weakness   Cervical / Trunk Assessment Cervical / Trunk Assessment: Normal    Vision Patient Visual Report: No change from baseline Vision Assessment?: No apparent visual deficits   Perception     Praxis      Cognition Arousal/Alertness: Awake/alert Behavior During Therapy: WFL for tasks assessed/performed Overall Cognitive  Status: Within Functional Limits for tasks assessed                                          Exercises      Shoulder Instructions       General Comments      Pertinent Vitals/ Pain       Pain Assessment Pain Assessment: No/denies pain  Home Living                                          Prior  Functioning/Environment              Frequency  Min 2X/week        Progress Toward Goals  OT Goals(current goals can now be found in the care plan section)  Progress towards OT goals: Progressing toward goals  Acute Rehab OT Goals Patient Stated Goal: get stronger OT Goal Formulation: With patient Time For Goal Achievement: 04/01/22 Potential to Achieve Goals: Good  Plan Discharge plan remains appropriate    Co-evaluation                 AM-PAC OT "6 Clicks" Daily Activity     Outcome Measure   Help from another person eating meals?: None Help from another person taking care of personal grooming?: A Little Help from another person toileting, which includes using toliet, bedpan, or urinal?: A Little Help from another person bathing (including washing, rinsing, drying)?: A Little Help from another person to put on and taking off regular upper body clothing?: A Little Help from another person to put on and taking off regular lower body clothing?: A Little 6 Click Score: 19    End of Session Equipment Utilized During Treatment: Gait belt;Rolling walker (2 wheels)  OT Visit Diagnosis: Unsteadiness on feet (R26.81);Muscle weakness (generalized) (M62.81);Other symptoms and signs involving cognitive function;Cognitive communication deficit (R41.841)   Activity Tolerance Patient tolerated treatment well   Patient Left in chair;with call bell/phone within reach (lab tech in room)   Nurse Communication Mobility status        Time: [id number] OT Time Calculation (min): 12 min  Charges: OT General Charges $OT Visit: 1 Visit OT Treatments $Therapeutic Activity: 8-22 mins  Gustavo Lah, OTR/L Acute Care Rehab Services  Office [phone]   Lenward Chancellor 03/29/2022, 12:25 PM

## 2022-03-29 NOTE — Progress Notes (Signed)
Pharmacy Note: Methotrexate Level Update  MTX level drawn at 0500 this AM < 0.05. Results given to Dr. Marin Olp. Per MD, okay to discontinue Leucovorin.   Lindell Spar, PharmD, BCPS Clinical Pharmacist 03/29/2022 10:24 AM

## 2022-03-29 NOTE — Progress Notes (Signed)
Pharmacy Antibiotic Note  Ariel [name] is a 67 y.o. female admitted on 03/17/2022 with recurrent CNS lymphoma treated with Rituximab, Vincristine, Thiotepa, and MTX 03/21/22-03/23/22. Currently receiving Leucovorin rescue. Patient with fever up to 102 this AM. Pharmacy has been consulted for Cefepime dosing for concern of febrile neutropenia.  Plan: Cefepime 2g IV q8h Monitor renal function, cultures, clinical course  Height: '5\' 7"'$  (170.2 cm) Weight: 61.1 kg (134 lb 11.2 oz) IBW/kg (Calculated) : 61.6  Temp (24hrs), Avg:100 F (37.8 C), Min:98.5 F (36.9 C), Max:102 F (38.9 C)  Recent Labs  Lab 03/24/22 0610 03/25/22 0530 03/26/22 0520 03/27/22 0600 03/28/22 0500 03/28/22 0847 03/29/22 0500  WBC 8.3 8.2 7.6 7.2  --  4.2  --   CREATININE 0.57 0.63 0.79 0.75 0.59  --  0.62    Estimated Creatinine Clearance: 65.8 mL/min (by C-G formula based on SCr of 0.62 mg/dL).    Allergies  Allergen Reactions   Decadron [Dexamethasone] Other (See Comments)    Leg weakness Hallucinations   Plavix [Clopidogrel] Hives, Swelling and Other (See Comments)    Lip swelling    Prednisone Other (See Comments)    Steroids caused psychological issues  Hallucinations   Codeine Nausea And Vomiting    Antimicrobials this admission: 1/24 Ceftriaxone x1 1/25 Cephalexin >> 1/29 1/27 Fluconazole >> 1/27 Famciclovir >> 1/31 Cefepime >>  Microbiology results: 1/22 UCx: E. coli (pan-sensitive)  1/31 BCx:   Thank you for allowing pharmacy to be a part of this patient's care.   Lindell Spar, PharmD, BCPS Clinical Pharmacist 03/29/2022 7:27 AM

## 2022-03-30 ENCOUNTER — Inpatient Hospital Stay: Payer: Medicare Other | Admitting: Hematology & Oncology

## 2022-03-30 ENCOUNTER — Encounter: Payer: Self-pay | Admitting: *Deleted

## 2022-03-30 ENCOUNTER — Inpatient Hospital Stay: Payer: Medicare Other

## 2022-03-30 DIAGNOSIS — C8589 Other specified types of non-Hodgkin lymphoma, extranodal and solid organ sites: Secondary | ICD-10-CM | POA: Diagnosis not present

## 2022-03-30 DIAGNOSIS — E871 Hypo-osmolality and hyponatremia: Secondary | ICD-10-CM

## 2022-03-30 DIAGNOSIS — C8330 Diffuse large B-cell lymphoma, unspecified site: Secondary | ICD-10-CM | POA: Diagnosis not present

## 2022-03-30 DIAGNOSIS — K1231 Oral mucositis (ulcerative) due to antineoplastic therapy: Secondary | ICD-10-CM | POA: Diagnosis not present

## 2022-03-30 DIAGNOSIS — R059 Cough, unspecified: Secondary | ICD-10-CM | POA: Diagnosis not present

## 2022-03-30 LAB — CBC WITH DIFFERENTIAL/PLATELET
Abs Immature Granulocytes: 0.02 10*3/uL (ref 0.00–0.07)
Basophils Absolute: 0 10*3/uL (ref 0.0–0.1)
Basophils Relative: 0 %
Eosinophils Absolute: 0.1 10*3/uL (ref 0.0–0.5)
Eosinophils Relative: 3 %
HCT: 27.8 % — ABNORMAL LOW (ref 36.0–46.0)
Hemoglobin: 9.6 g/dL — ABNORMAL LOW (ref 12.0–15.0)
Immature Granulocytes: 1 %
Lymphocytes Relative: 16 %
Lymphs Abs: 0.4 10*3/uL — ABNORMAL LOW (ref 0.7–4.0)
MCH: 32.4 pg (ref 26.0–34.0)
MCHC: 34.5 g/dL (ref 30.0–36.0)
MCV: 93.9 fL (ref 80.0–100.0)
Monocytes Absolute: 0 10*3/uL — ABNORMAL LOW (ref 0.1–1.0)
Monocytes Relative: 1 %
Neutro Abs: 1.9 10*3/uL (ref 1.7–7.7)
Neutrophils Relative %: 79 %
Platelets: 39 10*3/uL — ABNORMAL LOW (ref 150–400)
RBC: 2.96 MIL/uL — ABNORMAL LOW (ref 3.87–5.11)
RDW: 12.5 % (ref 11.5–15.5)
WBC: 2.4 10*3/uL — ABNORMAL LOW (ref 4.0–10.5)
nRBC: 0 % (ref 0.0–0.2)

## 2022-03-30 LAB — BASIC METABOLIC PANEL
Anion gap: 9 (ref 5–15)
BUN: 18 mg/dL (ref 8–23)
CO2: 20 mmol/L — ABNORMAL LOW (ref 22–32)
Calcium: 8.6 mg/dL — ABNORMAL LOW (ref 8.9–10.3)
Chloride: 97 mmol/L — ABNORMAL LOW (ref 98–111)
Creatinine, Ser: 0.65 mg/dL (ref 0.44–1.00)
GFR, Estimated: >60 mL/min (ref >60)
Glucose, Bld: 119 mg/dL — ABNORMAL HIGH (ref 70–99)
Potassium: 3.8 mmol/L (ref 3.5–5.1)
Sodium: 126 mmol/L — ABNORMAL LOW (ref 135–145)

## 2022-03-30 LAB — SODIUM, URINE, RANDOM: Sodium, Ur: 145 mmol/L

## 2022-03-30 LAB — PHOSPHORUS: Phosphorus: 2.3 mg/dL — ABNORMAL LOW (ref 2.5–4.6)

## 2022-03-30 LAB — MAGNESIUM: Magnesium: 1.9 mg/dL (ref 1.7–2.4)

## 2022-03-30 LAB — OSMOLALITY, URINE: Osmolality, Ur: 632 mOsm/kg (ref 300–900)

## 2022-03-30 MED ORDER — SODIUM CHLORIDE 1 G PO TABS
1.0000 g | ORAL_TABLET | Freq: Three times a day (TID) | ORAL | Status: AC
  Administered 2022-03-30 – 2022-04-04 (×15): 1 g via ORAL
  Filled 2022-03-30 (×15): qty 1

## 2022-03-30 MED ORDER — BENZONATATE 100 MG PO CAPS
100.0000 mg | ORAL_CAPSULE | Freq: Once | ORAL | Status: AC
  Administered 2022-03-31: 100 mg via ORAL
  Filled 2022-03-30: qty 1

## 2022-03-30 MED ORDER — K PHOS MONO-SOD PHOS DI & MONO 155-852-130 MG PO TABS
500.0000 mg | ORAL_TABLET | Freq: Three times a day (TID) | ORAL | Status: DC
  Administered 2022-03-30 – 2022-03-31 (×4): 500 mg via ORAL
  Filled 2022-03-30 (×5): qty 2

## 2022-03-30 NOTE — Progress Notes (Signed)
PT Cancellation Note  Patient Details Name: Ariel [name] MRN: [id number] DOB: 13-Sep-1955   Cancelled Treatment:    Reason Eval/Treat Not Completed: Fatigue/lethargy limiting ability to participate (pt reports she's tired from not sleeping well last night and wants to rest right now. Will attempt again later today.)   Philomena Doheny PT 03/30/2022  [place]  Office [phone]

## 2022-03-30 NOTE — Progress Notes (Signed)
Nutrition Follow-up  DOCUMENTATION CODES:   Non-severe (moderate) malnutrition in context of chronic illness  INTERVENTION:   -Ensure Plus High Protein po TID, each supplement provides 350 kcal and 20 grams of protein.   -Multivitamin with minerals daily  -Magic cup BID with meals, each supplement provides 290 kcal and 9 grams of protein    NUTRITION DIAGNOSIS:   Moderate Malnutrition related to chronic illness, cancer and cancer related treatments as evidenced by mild fat depletion, mild muscle depletion.  Ongoing.  GOAL:   Patient will meet greater than or equal to 90% of their needs  Progressing.  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  CT of the head revealed increased edema in the area for left frontal lobe lesion with no acute hemorrhage or evidence of CVA.  MRI of the brain showed progression of lymphoma in the left frontal operculum.  Patient consuming 75-100% of meals at this time.  Pt accepting Ensure supplements.  Had magic mouthwash ordered d/t mouth sores but doesn't seem to help.   Admission weight: 152 lbs Last weight 1/29: 134 lbs  Medications: Pepcid, Magic mouthwash w/ lidocaine, Multivitamin with minerals daily, K-Phos  Labs reviewed: CBGs: 97-104 Low Na Low Phos   Diet Order:   Diet Order             Diet regular Room service appropriate? Yes; Fluid consistency: Thin; Fluid restriction: 1500 mL Fluid  Diet effective now                   EDUCATION NEEDS:   No education needs have been identified at this time  Skin:  Skin Assessment: Reviewed RN Assessment  Last BM:  1/31 -type 6  Height:   Ht Readings from Last 1 Encounters:  03/17/22 '5\' 7"'$   (1.702 m)    Weight:   Wt Readings from Last 1 Encounters:  03/27/22 61.1 kg    BMI:  Body mass index is 21.1 kg/m.  Estimated Nutritional Needs:   Kcal:  2050-2250  Protein:  100-110g  Fluid:  2.2L/day   Ariel Bibles, MS, RD, LDN Inpatient Clinical Dietitian Contact information available via Amion

## 2022-03-30 NOTE — Progress Notes (Signed)
Patient initially scheduled today for office follow up, however was admitted to the hospital on 03/17/2022 for neurological symptoms related to her CNS mets. She began treatment on 03/21/2022.  Patient continues to be hospitalized with complications post treatment. Current discharge plan is for SNF. Will continue to follow for poast discharge needs and office follow up.   Oncology Nurse Navigator Documentation     03/30/2022    8:00 AM  Oncology Nurse Navigator Flowsheets  Navigator Follow Up Date: 04/04/2022  Navigator Follow Up Reason: Appointment Review  Navigator Location [facility]  Navigator Encounter Type Appt/Treatment Plan Review  Patient Visit Type MedOnc  Treatment Phase Active Tx  Barriers/Navigation Needs No Barriers At This Time  Interventions None Required  Acuity Level 1-No Barriers  Support Groups/Services Friends and Family  Time Spent with Patient 15

## 2022-03-30 NOTE — Progress Notes (Addendum)
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  CT of the head revealed increased edema in the area for left frontal lobe lesion with no acute hemorrhage or evidence of CVA.  MRI of the brain showed progression of lymphoma in the left frontal operculum.  Oncology following, started in house chemotherapy.  Finished the current cycle of chemotherapy.  Patient and family interested in going to SNF.  Hospital course remarkable for intermittent fever, new onset neutropenia likely from chemotherapy.  Assessment & Plan:  Active Problems:   Hyperlipidemia   Melanoma ([place])   High grade B-cell lymphoma ([facility])   Diffuse large B cell lymphoma ([facility])   CNS lymphoma ([place])   Aphasia   Palliative care by specialist   Malnutrition of moderate degree   High-grade non-Hodgkin's lymphoma with brain involvement: Follows with Dr. Marin Olp.  Also follows with radiation oncology, neuro-oncology.  MRI finding as above.  EEG without any evidence of epileptiform discharge.  Started on chemotherapy in 1/23,completed first cycle.On famciclovir, Diflucan  Fever/neutropenia/thrombocytopenia: Spiked  temperature of 100.7 F today.suspicion for development of febrile neutropenia  .Empirically started on cefepime.  Blood culture obtained.  No growth till date.  Continue to monitor Earlier, Urine culture showed pansensitive E. coli.  completed  5 days course of keflex  Aphasia with right-sided weakness: due to left frontal lobe CNS lymphoma lesion with worsening edema.  On high-dose steroids.Given chemotherapy.  Now much better.  Her speech is clear  Hyponatremia: Suspected to be from sodium bicarb drip  with dextrose while she was on chemo.Cud also be SIADH.  Urine sodium of 145.  Continue fluid restriction, IV fluids will be discontinued.  Continue salt tablets.  Check BMP tomorrow  Hypophosphatemia: Currently being supplemented.  Elevated liver enzymes: Most likely secondary to chemotherapy. Almost resolved  Cough: CXR  showed bibasilar subsegmental atelectasis.  Continue to encourage using incentive spirometer.remains on room air  Mucositis: Has some discomfort in the oral cavity.  Continue oral ringe  Generalized weakness: PT/OT consulted.  Patient and family requesting SNF. PT recommended same  Nutrition Problem: Moderate Malnutrition Etiology: chronic illness, cancer and cancer related treatments    DVT prophylaxis:enoxaparin (LOVENOX) injection 40 mg Start: 03/20/22 1700     Code Status: Full Code  Family Communication: Daughter at bedside on 1/29  Patient status:Inpatient  Patient is from :Home  Anticipated discharge to:SNF  Estimated DC date:after oncology clearance,waiting for bed   Consultants: Oncology  Procedures:None  Antimicrobials:  Anti-infectives (From admission, onward)    Start     Dose/Rate Route Frequency Ordered Stop   03/29/22 0800  ceFEPIme (MAXIPIME) 2 g in sodium chloride 0.9 % 100 mL IVPB        2 g 200 mL/hr over 30 Minutes Intravenous Every 8 hours 03/29/22 0727     03/27/22 1000  fluconazole (DIFLUCAN) tablet 100 mg        100 mg Oral Daily 03/27/22 0701     03/25/22 1000  fluconazole (DIFLUCAN) tablet 200 mg  Status:  Discontinued        200 mg Oral Daily 03/25/22 0649 03/27/22 0701   03/25/22 1000  famciclovir (FAMVIR) tablet 500 mg        500  mg Oral Daily 03/25/22 0649     03/23/22 1000  cephALEXin (KEFLEX) capsule 500 mg        500 mg Oral Every 12 hours 03/22/22 1056 03/27/22 2116   03/22/22 0800  ciprofloxacin (CIPRO) tablet 500 mg  Status:  Discontinued        500 mg Oral 2 times daily 03/22/22 0635 03/22/22 0703   03/22/22  0800  cefTRIAXone (ROCEPHIN) 1 g in sodium chloride 0.9 % 100 mL IVPB  Status:  Discontinued        1 g 200 mL/hr over 30 Minutes Intravenous Every 24 hours 03/22/22 0703 03/22/22 1056       Subjective:   Patient seen and examined at the bedside today.  Spiked fever of 100.7 F.  During my evaluation, she was comfortable.  Denies any nausea, vomiting, cough, shortness of breath or diarrhea.  Continues to remain weak, lying in bed  Objective: Vitals:   03/29/22 1419 03/29/22 1821 03/29/22 2136 03/30/22 0608  BP: 118/65 130/65 (!) 134/55 132/64  Pulse: 95 100 82 91  Resp: '18 20 18 18  '$ Temp: 100 F (37.8 C) 100.3 F (37.9 C) 98.4 F (36.9 C) (!) 100.7 F (38.2 C)  TempSrc: Oral Oral Oral Oral  SpO2: 97% 96% 97% 98%  Weight:      Height:        Intake/Output Summary (Last 24 hours) at 03/30/2022 1129 Last data filed at 03/30/2022 0700 Gross per 24 hour  Intake 806.89 ml  Output 600 ml  Net 206.89 ml   Filed Weights   03/17/22 1000 03/17/22 1823 03/27/22 0622  Weight: 69 kg 69 kg 61.1 kg    Examination:   General exam: Overall comfortable, not in distress,weak appearing HEENT: PERRL Respiratory system:  no wheezes or crackles  Cardiovascular system: S1 & S2 heard, RRR.  Gastrointestinal system: Abdomen is nondistended, soft and nontender. Central nervous system: Alert and oriented Extremities: No edema, no clubbing ,no cyanosis Skin: No rashes, no ulcers,no icterus     Data Reviewed: I have personally reviewed following labs and imaging studies  CBC: Recent Labs  Lab 03/25/22 0530 03/26/22 0520 03/27/22 0600 03/28/22 0847 03/29/22 0846 03/30/22 0500  WBC 8.2 7.6 7.2 4.2 5.4 2.4*  NEUTROABS 7.4 7.1 6.7  --  4.8 1.9  HGB 11.4* 11.6* 11.1* 11.1* 11.3* 9.6*  HCT 32.1* 33.0* 32.4* 32.4* 33.1* 27.8*  MCV 90.2 91.4 93.4 94.5 94.3 93.9  PLT 100* 102* 95* 75* 67* 39*   Basic Metabolic Panel: Recent Labs  Lab 03/26/22 0520 03/27/22 0600 03/28/22 0500  03/29/22 0500 03/30/22 0500  NA 133* 127* 128* 127* 126*  K 3.5 4.2 4.2 3.6 3.8  CL 93* 90* 93* 94* 97*  CO2 '29 27 25 22 '$ 20*  GLUCOSE 130* 121* 99 135* 119*  BUN '22 17 18 21 18  '$ CREATININE 0.79 0.75 0.59 0.62 0.65  CALCIUM 9.0 9.0 9.4 8.7* 8.6*  MG 2.4 2.2 2.2 2.1 1.9  PHOS 2.6 2.3* 2.8 2.7 2.3*     Recent Results (from the past 240 hour(s))  Culture, blood (Routine X 2) w Reflex to ID Panel     Status: None (Preliminary result)   Collection Time: 03/29/22  8:46 AM   Specimen: BLOOD  Result Value Ref Range Status   Specimen Description   Final    BLOOD CENTRAL LINE BLOOD RIGHT ARM Performed at [facility], [place] [address] [place],  [address]    Special Requests  Final    BOTTLES DRAWN AEROBIC AND ANAEROBIC Blood Culture adequate volume Performed at [facility] [address], [place], [place] [address]    Culture   Final    NO GROWTH < 24 HOURS Performed at [facility] [address], [place], [place] [address]    Report Status PENDING  Incomplete  Culture, blood (Routine X 2) w Reflex to ID Panel     Status: None (Preliminary result)   Collection Time: 03/29/22  8:52 AM   Specimen: BLOOD  Result Value Ref Range Status   Specimen Description   Final    BLOOD Line BLOOD LEFT ARM Performed at [facility] [address] [place], [place] [address]    Special Requests   Final    BOTTLES DRAWN AEROBIC AND ANAEROBIC Blood Culture adequate volume Performed at [facility] [address] [place], [place] [address]    Culture   Final    NO GROWTH < 24 HOURS Performed at [facility] [address] [place], [place] [address]    Report Status PENDING  Incomplete     Radiology Studies: No results found.  Scheduled Meds:  antiseptic oral rinse  15 mL Mouth Rinse Q6H   Chlorhexidine Gluconate Cloth  6 each Topical Daily   dexamethasone (DECADRON) injection  4 mg  Intravenous Daily   enoxaparin (LOVENOX) injection  40 mg Subcutaneous Q24H   famciclovir  500 mg Oral Daily   famotidine  20 mg Oral Daily   feeding supplement  237 mL Oral TID BM   fluconazole  100 mg Oral Daily   magic mouthwash w/lidocaine  10 mL Oral QID   multivitamin with minerals  1 tablet Oral Daily   naphazoline-glycerin  2 drop Both Eyes Q8H   phosphorus  500 mg Oral TID   sodium bicarbonate/sodium chloride   Mouth Rinse Q4H   sodium chloride flush  10-40 mL Intracatheter Q12H   sodium chloride  1 g Oral TID WC   Continuous Infusions:  ceFEPime (MAXIPIME) IV 2 g (03/30/22 0857)      LOS: 13 days   Shelly Coss, MD [facility] P2/02/2022, 11:29 AM

## 2022-03-30 NOTE — Plan of Care (Signed)
Patient Ariel [name], VSS throughout shift.  All meds given on time as ordered. Denied pain. Diminished lungs, IS encouraged. Persistent cough.  Pt voided in bathroom. POC maintained, will continue to monitor.  Problem: Education: Goal: Knowledge of General Education information will improve Description: Including pain rating scale, medication(s)/side effects and non-pharmacologic comfort measures Outcome: Progressing   Problem: Health Behavior/Discharge Planning: Goal: Ability to manage health-related needs will improve Outcome: Progressing   Problem: Clinical Measurements: Goal: Ability to maintain clinical measurements within normal limits will improve Outcome: Progressing Goal: Will remain free from infection Outcome: Progressing Goal: Diagnostic test results will improve Outcome: Progressing Goal: Respiratory complications will improve Outcome: Progressing Goal: Cardiovascular complication will be avoided Outcome: Progressing   Problem: Activity: Goal: Risk for activity intolerance will decrease Outcome: Progressing   Problem: Nutrition: Goal: Adequate nutrition will be maintained Outcome: Progressing   Problem: Coping: Goal: Level of anxiety will decrease Outcome: Progressing   Problem: Elimination: Goal: Will not experience complications related to bowel motility Outcome: Progressing Goal: Will not experience complications related to urinary retention Outcome: Progressing   Problem: Pain Managment: Goal: General experience of comfort will improve Outcome: Progressing   Problem: Safety: Goal: Ability to remain free from injury will improve Outcome: Progressing   Problem: Skin Integrity: Goal: Risk for impaired skin integrity will decrease Outcome: Progressing   Problem: Education: Goal: Knowledge of disease or condition will improve Outcome: Progressing Goal: Knowledge of secondary prevention will improve (MUST DOCUMENT ALL) Outcome: Progressing Goal: Knowledge  of patient specific risk factors will improve Elta [place] N/A or DELETE if not current risk factor) Outcome: Progressing   Problem: Ischemic Stroke/TIA Tissue Perfusion: Goal: Complications of ischemic stroke/TIA will be minimized Outcome: Progressing   Problem: Coping: Goal: Will verbalize positive feelings about self Outcome: Progressing Goal: Will identify appropriate support needs Outcome: Progressing   Problem: Health Behavior/Discharge Planning: Goal: Ability to manage health-related needs will improve Outcome: Progressing Goal: Goals will be collaboratively established with patient/family Outcome: Progressing   Problem: Self-Care: Goal: Ability to participate in self-care as condition permits will improve Outcome: Progressing Goal: Verbalization of feelings and concerns over difficulty with self-care will improve Outcome: Progressing Goal: Ability to communicate needs accurately will improve Outcome: Progressing   Problem: Nutrition: Goal: Risk of aspiration will decrease Outcome: Progressing Goal: Dietary intake will improve Outcome: Progressing

## 2022-03-30 NOTE — Progress Notes (Signed)
Ms. Kindley feels a little bit better.  She still has some of the mouth sores.  The Magic mouthwash seem to make things a little bit worse for her.  She still has little bit of a cough.  Her white cell count is dropping a little bit.  Today, her CBC showed a white cell count of 2.4.  Hemoglobin 9.6.  And platelet count is 39,000.  Her sodium is 126.  This is still quite low.  Chloride is still on the low side at 97.  Her BUN is 18 creatinine 0.65.  I know that she is being worked on by Occupational Therapy and physical therapy.  I do appreciate their help.  She is on antibiotics.  Cultures so far have been negative.  She had a temperature of 102 yesterday.  Her temperature today is 100.7.  Pulse 91.  Blood pressure 132/64.  Her oral exam shows some healing of the mucositis.  There is no adenopathy in the neck.  Her lungs sound relatively clear bilaterally.  I do not hear any wheezes.  Cardiac exam regular rate and rhythm.  Abdomen is soft.  Bowel sounds are present.  There is no guarding or rebound tenderness.  Again, we will have to watch her white cell count closely.  She still is not ready for G-CSF.  Her platelet count is dropping.  I would still keep her on the low-dose prophylactic Lovenox.  She has a hyponatremia.  This is little bit tricky with respect to treatment.  She is on IV fluid right now.  We are still trying to find a skilled nursing facility for her.  I would not think that she would be ready for this until next week.  I just hate that she is having troubles with the protocol.  I am little bit surprised since we have done high-dose methotrexate in the past and she has done quite nicely.  I do appreciate the help from everybody up on 6 E.  [name], MD  Proverbs 3:5-6

## 2022-03-30 NOTE — Progress Notes (Signed)
Physical Therapy Treatment Patient Details Name: Ariel [name] MRN: [id number] DOB: Jun 23, 1955 Today's Date: 03/30/2022   History of Present Illness patient is a 67 year old white female presented 03/17/22 with  right facial droop, dysarthria, aphasia. MRI showed recurrence non-Hodgkin's lymphoma in left frontal lymphoma lesion PMH-CNS lymphoma, melanoma of the skin, high-grade B-cell lymphoma    PT Comments    Pt is progressing well with mobility, she tolerated increased ambulation distance of 110' with RW, no loss of balance. Pt performed seated BUE/LE strengthening exercises and was encouraged to perform these independently to minimize deconditioning.     Recommendations for follow up therapy are one component of a multi-disciplinary discharge planning process, led by the attending physician.  Recommendations may be updated based on patient status, additional functional criteria and insurance authorization.  Follow Up Recommendations  Home health PT Can patient physically be transported by private vehicle: Yes   Assistance Recommended at Discharge Intermittent Supervision/Assistance  Patient can return home with the following Assistance with cooking/housework;Help with stairs or ramp for entrance   Equipment Recommendations  None recommended by PT    Recommendations for Other Services       Precautions / Restrictions Precautions Precautions: Fall Restrictions Weight Bearing Restrictions: No     Mobility  Bed Mobility Overal bed mobility: Modified Independent Bed Mobility: Supine to Sit     Supine to sit: Modified independent (Device/Increase time)     General bed mobility comments: used bed rail    Transfers Overall transfer level: Needs assistance Equipment used: Rolling walker (2 wheels) Transfers: Sit to/from Stand Sit to Stand: Supervision           General transfer comment: VCs hand placement    Ambulation/Gait Ambulation/Gait assistance:  Supervision Gait Distance (Feet): 110 Feet Assistive device: Rolling walker (2 wheels) Gait Pattern/deviations: Step-through pattern, Decreased stride length Gait velocity: decreased     General Gait Details: steady, no loss of balance   Stairs             Wheelchair Mobility    Modified Rankin (Stroke Patients Only)       Balance Overall balance assessment: Needs assistance Sitting-balance support: No upper extremity supported, Feet supported Sitting balance-Leahy Scale: Good     Standing balance support: No upper extremity supported Standing balance-Leahy Scale: Fair                              Cognition Arousal/Alertness: Awake/alert Behavior During Therapy: WFL for tasks assessed/performed Overall Cognitive Status: Within Functional Limits for tasks assessed                                          Exercises General Exercises - Upper Extremity Shoulder Flexion: AROM, Both, 10 reps, Seated General Exercises - Lower Extremity Ankle Circles/Pumps: AROM, Both, 10 reps, Seated Long Arc Quad: AROM, Strengthening, Both, Seated, 10 reps Hip Flexion/Marching: AROM, Strengthening, Both, 10 reps, Seated    General Comments        Pertinent Vitals/Pain Pain Assessment Pain Assessment: No/denies pain Faces Pain Scale: No hurt    Home Living                          Prior Function            PT Goals (current goals can now  be found in the care plan section) Acute Rehab PT Goals Patient Stated Goal: Regain IND, likes to sew PT Goal Formulation: With patient Time For Goal Achievement: 01/30/21 Potential to Achieve Goals: Fair Progress towards PT goals: Progressing toward goals    Frequency    Min 3X/week      PT Plan Discharge plan needs to be updated    Co-evaluation              AM-PAC PT "6 Clicks" Mobility   Outcome Measure  Help needed turning from your back to your side while in a flat bed  without using bedrails?: None Help needed moving from lying on your back to sitting on the side of a flat bed without using bedrails?: None Help needed moving to and from a bed to a chair (including a wheelchair)?: None Help needed standing up from a chair using your arms (e.g., wheelchair or bedside chair)?: None Help needed to walk in hospital room?: None Help needed climbing 3-5 steps with a railing? : A Little 6 Click Score: 23    End of Session Equipment Utilized During Treatment: Gait belt Activity Tolerance: Patient tolerated treatment well Patient left: in chair;with call bell/phone within reach Nurse Communication: Mobility status PT Visit Diagnosis: Other abnormalities of gait and mobility (R26.89);Muscle weakness (generalized) (M62.81)     Time: 4356-8616 PT Time Calculation (min) (ACUTE ONLY): 11 min  Charges:  $Gait Training: 8-22 mins                     Blondell Reveal Kistler PT 03/30/2022  Acute Rehabilitation Services  Office [phone]

## 2022-03-31 DIAGNOSIS — R059 Cough, unspecified: Secondary | ICD-10-CM | POA: Diagnosis not present

## 2022-03-31 DIAGNOSIS — C8330 Diffuse large B-cell lymphoma, unspecified site: Secondary | ICD-10-CM | POA: Diagnosis not present

## 2022-03-31 DIAGNOSIS — K1231 Oral mucositis (ulcerative) due to antineoplastic therapy: Secondary | ICD-10-CM | POA: Diagnosis not present

## 2022-03-31 DIAGNOSIS — E871 Hypo-osmolality and hyponatremia: Secondary | ICD-10-CM | POA: Diagnosis not present

## 2022-03-31 DIAGNOSIS — C8589 Other specified types of non-Hodgkin lymphoma, extranodal and solid organ sites: Secondary | ICD-10-CM | POA: Diagnosis not present

## 2022-03-31 LAB — BASIC METABOLIC PANEL
Anion gap: 11 (ref 5–15)
BUN: 16 mg/dL (ref 8–23)
CO2: 23 mmol/L (ref 22–32)
Calcium: 8.6 mg/dL — ABNORMAL LOW (ref 8.9–10.3)
Chloride: 96 mmol/L — ABNORMAL LOW (ref 98–111)
Creatinine, Ser: 0.49 mg/dL (ref 0.44–1.00)
GFR, Estimated: >60 mL/min (ref >60)
Glucose, Bld: 93 mg/dL (ref 70–99)
Potassium: 3.7 mmol/L (ref 3.5–5.1)
Sodium: 130 mmol/L — ABNORMAL LOW (ref 135–145)

## 2022-03-31 LAB — CBC WITH DIFFERENTIAL/PLATELET
Abs Immature Granulocytes: 0.01 10*3/uL (ref 0.00–0.07)
Basophils Absolute: 0 10*3/uL (ref 0.0–0.1)
Basophils Relative: 0 %
Eosinophils Absolute: 0 10*3/uL (ref 0.0–0.5)
Eosinophils Relative: 1 %
HCT: 26.8 % — ABNORMAL LOW (ref 36.0–46.0)
Hemoglobin: 9.5 g/dL — ABNORMAL LOW (ref 12.0–15.0)
Immature Granulocytes: 1 %
Lymphocytes Relative: 17 %
Lymphs Abs: 0.3 10*3/uL — ABNORMAL LOW (ref 0.7–4.0)
MCH: 32.9 pg (ref 26.0–34.0)
MCHC: 35.4 g/dL (ref 30.0–36.0)
MCV: 92.7 fL (ref 80.0–100.0)
Monocytes Absolute: 0 10*3/uL — ABNORMAL LOW (ref 0.1–1.0)
Monocytes Relative: 2 %
Neutro Abs: 1.3 10*3/uL — ABNORMAL LOW (ref 1.7–7.7)
Neutrophils Relative %: 79 %
Platelets: 33 10*3/uL — ABNORMAL LOW (ref 150–400)
RBC: 2.89 MIL/uL — ABNORMAL LOW (ref 3.87–5.11)
RDW: 12.4 % (ref 11.5–15.5)
WBC: 1.7 10*3/uL — ABNORMAL LOW (ref 4.0–10.5)
nRBC: 0 % (ref 0.0–0.2)

## 2022-03-31 LAB — PHOSPHORUS: Phosphorus: 2.6 mg/dL (ref 2.5–4.6)

## 2022-03-31 LAB — MAGNESIUM: Magnesium: 1.9 mg/dL (ref 1.7–2.4)

## 2022-03-31 MED ORDER — K PHOS MONO-SOD PHOS DI & MONO 155-852-130 MG PO TABS
250.0000 mg | ORAL_TABLET | Freq: Three times a day (TID) | ORAL | Status: DC
  Administered 2022-03-31 – 2022-04-14 (×41): 250 mg via ORAL
  Filled 2022-03-31 (×45): qty 1

## 2022-03-31 MED ORDER — BENZONATATE 100 MG PO CAPS
100.0000 mg | ORAL_CAPSULE | Freq: Three times a day (TID) | ORAL | Status: DC | PRN
  Administered 2022-03-31 – 2022-04-01 (×3): 100 mg via ORAL
  Filled 2022-03-31 (×3): qty 1

## 2022-03-31 MED ORDER — FILGRASTIM 480 MCG/1.6ML IJ SOLN
480.0000 ug | Freq: Every day | INTRAMUSCULAR | Status: DC
  Administered 2022-03-31 – 2022-04-04 (×5): 480 ug via SUBCUTANEOUS
  Filled 2022-03-31 (×5): qty 1.6

## 2022-03-31 NOTE — Progress Notes (Signed)
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  CT of the head revealed increased edema in the area for left frontal lobe lesion with no acute hemorrhage or evidence of CVA.  MRI of the brain showed progression of lymphoma in the left frontal operculum.  Oncology following, started in house chemotherapy.  Finished the current cycle of chemotherapy.  Patient and family interested in going to SNF.  Hospital course remarkable for intermittent fever, new onset neutropenia likely from chemotherapy.  Assessment & Plan:  Active Problems:   Hyperlipidemia   Melanoma ([place])   High grade B-cell lymphoma (HCC)   Diffuse large B cell lymphoma (HCC)   CNS lymphoma ([place])   Aphasia   Palliative care by specialist   Malnutrition of moderate degree   High-grade non-Hodgkin's lymphoma with brain involvement: Follows with Dr. Marin Olp.  Also follows with radiation oncology, neuro-oncology.  MRI finding as above.  EEG without any evidence of epileptiform discharge.  Started on chemotherapy in 1/23,completed first cycle.On famciclovir, Diflucan  Fever/neutropenia/thrombocytopenia: Spiked  temperature intermittently but afebrile this morning.suspicion for development of febrile neutropenia  .Empirically started on cefepime.  Blood culture obtained.  No growth till date.  Continue to monitor Earlier, Urine culture showed pansensitive E. coli.  completed  5 days course of keflex  Aphasia with right-sided weakness: due to left frontal lobe CNS lymphoma lesion with worsening edema.  On high-dose steroids.Given chemotherapy.  Now much better.  Her speech is clear  Hyponatremia: Suspected to be  from sodium bicarb drip with dextrose while she was on chemo.Cud also be SIADH.  Urine sodium of 145.  IV fluids will be discontinued.  Continue salt tablets.  Check BMP tomorrow  Hypophosphatemia: Currently being supplemented.  Elevated liver enzymes: Most likely secondary to chemotherapy. Almost resolved  Cough: CXR  showed bibasilar subsegmental atelectasis.  Continue to encourage using incentive spirometer.remains on room air  Mucositis: Has some discomfort in the oral cavity.  Continue oral ringe  Generalized weakness: Complains of severe weakness.  PT/OT consulted.  Patient and family requesting SNF. PT recommended same  Nutrition Problem: Moderate Malnutrition Etiology: chronic illness, cancer and cancer related treatments    DVT prophylaxis:enoxaparin (LOVENOX) injection 40 mg Start: 03/20/22 1700     Code Status: Full Code  Family Communication: Daughter at bedside on 1/29  Patient status:Inpatient  Patient is from :Home  Anticipated discharge to:SNF  Estimated DC date:after oncology clearance,waiting for bed   Consultants: Oncology  Procedures:None  Antimicrobials:  Anti-infectives (From admission, onward)    Start     Dose/Rate Route Frequency Ordered Stop   03/29/22 0800  ceFEPIme (MAXIPIME) 2 g in sodium chloride 0.9 % 100 mL IVPB        2 g 200 mL/hr over 30 Minutes Intravenous Every 8 hours 03/29/22 0727     03/27/22 1000  fluconazole (DIFLUCAN) tablet 100 mg        100 mg Oral Daily 03/27/22 0701     03/25/22 1000  fluconazole (DIFLUCAN) tablet 200 mg  Status:  Discontinued        200 mg Oral Daily 03/25/22 0649 03/27/22 0701   03/25/22 1000  famciclovir (FAMVIR) tablet 500 mg  500 mg Oral Daily 03/25/22 0649     03/23/22 1000  cephALEXin (KEFLEX) capsule 500 mg        500 mg Oral Every 12 hours 03/22/22 1056 03/27/22 2116   03/22/22 0800  ciprofloxacin (CIPRO) tablet 500 mg  Status:  Discontinued        500 mg Oral 2 times daily 03/22/22 0635  03/22/22 0703   03/22/22 0800  cefTRIAXone (ROCEPHIN) 1 g in sodium chloride 0.9 % 100 mL IVPB  Status:  Discontinued        1 g 200 mL/hr over 30 Minutes Intravenous Every 24 hours 03/22/22 0703 03/22/22 1056       Subjective:   Patient seen and examined at bedside today.  Appears weak.  Does not feel good today.  Denied nausea, vomiting, abdominal pain or diarrhea.  Objective: Vitals:   03/30/22 0608 03/30/22 1306 03/30/22 2016 03/31/22 0537  BP: 132/64 (!) 115/59 116/60 124/62  Pulse: 91 87 72 93  Resp: '18 20 18 18  '$ Temp: (!) 100.7 F (38.2 C) 98.2 F (36.8 C) 98.1 F (36.7 C) 99.7 F (37.6 C)  TempSrc: Oral Oral Oral Oral  SpO2: 98% 97% 97% 94%  Weight:      Height:        Intake/Output Summary (Last 24 hours) at 03/31/2022 1149 Last data filed at 03/31/2022 2423 Gross per 24 hour  Intake 620 ml  Output 1850 ml  Net -1230 ml   Filed Weights   03/17/22 1000 03/17/22 1823 03/27/22 0622  Weight: 69 kg 69 kg 61.1 kg    Examination:   General exam: Overall comfortable, not in distress,weak appearing HEENT: PERRL Respiratory system:  no wheezes or crackles  Cardiovascular system: S1 & S2 heard, RRR.  Gastrointestinal system: Abdomen is nondistended, soft and nontender. Central nervous system: Alert and oriented Extremities: No edema, no clubbing ,no cyanosis Skin: No rashes, no ulcers,no icterus     Data Reviewed: I have personally reviewed following labs and imaging studies  CBC: Recent Labs  Lab 03/26/22 0520 03/27/22 0600 03/28/22 0847 03/29/22 0846 03/30/22 0500 03/31/22 0315  WBC 7.6 7.2 4.2 5.4 2.4* 1.7*  NEUTROABS 7.1 6.7  --  4.8 1.9 1.3*  HGB 11.6* 11.1* 11.1* 11.3* 9.6* 9.5*  HCT 33.0* 32.4* 32.4* 33.1* 27.8* 26.8*  MCV 91.4 93.4 94.5 94.3 93.9 92.7  PLT 102* 95* 75* 67* 39* 33*   Basic Metabolic Panel: Recent Labs  Lab 03/27/22 0600 03/28/22 0500 03/29/22 0500 03/30/22 0500 03/31/22 0315  NA 127* 128* 127* 126* 130*  K 4.2 4.2 3.6  3.8 3.7  CL 90* 93* 94* 97* 96*  CO2 '27 25 22 '$ 20* 23  GLUCOSE 121* 99 135* 119* 93  BUN '17 18 21 18 16  '$ CREATININE 0.75 0.59 0.62 0.65 0.49  CALCIUM 9.0 9.4 8.7* 8.6* 8.6*  MG 2.2 2.2 2.1 1.9 1.9  PHOS 2.3* 2.8 2.7 2.3* 2.6     Recent Results (from the past 240 hour(s))  Culture, blood (Routine X 2) w Reflex to ID Panel     Status: None (Preliminary result)   Collection Time: 03/29/22  8:46 AM   Specimen: BLOOD  Result Value Ref Range Status   Specimen Description   Final    BLOOD CENTRAL LINE BLOOD RIGHT ARM Performed at [facility], [place] [address], [place], [place] [address]    Special Requests   Final    BOTTLES DRAWN AEROBIC AND ANAEROBIC Blood Culture adequate volume  Performed at [facility], [place] [address] [place], [place] [address]    Culture   Final    NO GROWTH 2 DAYS Performed at [place] [address] [place], [place] [address]    Report Status PENDING  Incomplete  Culture, blood (Routine X 2) w Reflex to ID Panel     Status: None (Preliminary result)   Collection Time: 03/29/22  8:52 AM   Specimen: BLOOD  Result Value Ref Range Status   Specimen Description   Final    BLOOD Line BLOOD LEFT ARM Performed at [place] [address], [place], [place] [address]    Special Requests   Final    BOTTLES DRAWN AEROBIC AND ANAEROBIC Blood Culture adequate volume Performed at [place] [address] [place], [place] [address]    Culture   Final    NO GROWTH 2 DAYS Performed at [place] [address], [place], [place] [address]    Report Status PENDING  Incomplete     Radiology Studies: No results found.  Scheduled Meds:  antiseptic oral rinse  15 mL Mouth Rinse Q6H   Chlorhexidine Gluconate Cloth  6 each Topical Daily   dexamethasone (DECADRON) injection  4 mg Intravenous Daily   enoxaparin (LOVENOX) injection  40 mg Subcutaneous Q24H    famciclovir  500 mg Oral Daily   famotidine  20 mg Oral Daily   feeding supplement  237 mL Oral TID BM   fluconazole  100 mg Oral Daily   magic mouthwash w/lidocaine  10 mL Oral QID   multivitamin with minerals  1 tablet Oral Daily   naphazoline-glycerin  2 drop Both Eyes Q8H   phosphorus  500 mg Oral TID   sodium bicarbonate/sodium chloride   Mouth Rinse Q4H   sodium chloride flush  10-40 mL Intracatheter Q12H   sodium chloride  1 g Oral TID WC   Continuous Infusions:  ceFEPime (MAXIPIME) IV 2 g (03/31/22 0947)      LOS: 14 days   Shelly Coss, MD [facility] P2/03/2022, 11:49 AM

## 2022-03-31 NOTE — TOC Progression Note (Signed)
Transition of Care [facility]) - Progression Note    Patient Details  Name: Ariel [name] MRN: [id number] Date of Birth: 1956-02-21  Transition of Care [facility] CM/SW Contact  Angelita Ingles, RN Phone Number:[phone]  03/31/2022, 1:48 PM  Clinical Narrative:    Daughter Magda Paganini made CM aware that daughter has been in contact with [facility] and that facility has a bed available in their ALF. Daughter understands that current recommendation is SNF but states that facility will come to the hospital on Monday to assess patient to determine if they can provide the care she needs. Daughter inquires if the MD will be able to change the recommendation. CM has made daughter aware that therapy will need to assess patient for recommendations for level of care needed. TOC following for disposition needs.     Expected Discharge Plan: [place] Barriers to Discharge: Continued Medical Work up  Expected Discharge Plan and Services In-house Referral: NA Discharge Planning Services: CM Consult Post Acute Care Choice: [place] Living arrangements for the past 2 months: Single Family Home                 DME Arranged: N/A DME Agency: NA       HH Arranged: NA HH Agency: NA         Social Determinants of Health (SDOH) Interventions SDOH Screenings   Food Insecurity: No Food Insecurity (03/18/2022)  Housing: Low Risk  (03/18/2022)  Transportation Needs: No Transportation Needs (03/18/2022)  Utilities: Not At Risk (03/18/2022)  Alcohol Screen: Low Risk  (07/08/2020)  Depression (PHQ2-9): Low Risk  (07/13/2021)  Financial Resource Strain: Low Risk  (07/13/2021)  Physical Activity: Insufficiently Active (07/13/2021)  Social Connections: Socially Isolated (07/13/2021)  Stress: No Stress Concern Present (07/13/2021)  Tobacco Use: Low Risk  (03/17/2022)    Readmission Risk Interventions    03/27/2022    3:00 PM 08/02/2021    3:11 PM 07/05/2021    2:17 PM  Readmission Risk  Prevention Plan  Transportation Screening Complete Complete Complete  Medication Review Press photographer) Complete Complete Complete  PCP or Specialist appointment within 3-5 days of discharge Complete Complete Complete  HRI or [place] Complete Complete Complete  SW Recovery Care/Counseling Consult Complete Complete Complete  Palliative Care Screening Complete Not Applicable Not [place] Complete Not Applicable Not Applicable

## 2022-03-31 NOTE — Progress Notes (Signed)
Physical Therapy Treatment Patient Details Name: Ariel [name] MRN: [id number] DOB: [birth year] Today's Date: 03/31/2022   History of Present Illness patient is a 67 year old white female presented 03/17/22 with  right facial droop, dysarthria, aphasia. MRI showed recurrence non-Hodgkin's lymphoma in left frontal lymphoma lesion PMH-CNS lymphoma, melanoma of the skin, high-grade B-cell lymphoma    PT Comments    Pt had significant decline in activity tolerance today, she ambulated 16' (compared to 110' yesterday)  with a RW with min A for balance, pt fatigued very quickly.  Pt required ongoing verbal cues for safe proximity to RW.   Recommendations for follow up therapy are one component of a multi-disciplinary discharge planning process, led by the attending physician.  Recommendations may be updated based on patient status, additional functional criteria and insurance authorization.  Follow Up Recommendations  Skilled nursing-short term rehab (<3 hours/day) Can patient physically be transported by private vehicle: Yes   Assistance Recommended at Discharge Intermittent Supervision/Assistance  Patient can return home with the following Assistance with cooking/housework;Help with stairs or ramp for entrance;A little help with walking and/or transfers;A little help with bathing/dressing/bathroom   Equipment Recommendations  None recommended by PT    Recommendations for Other Services       Precautions / Restrictions Precautions Precautions: Fall Restrictions Weight Bearing Restrictions: No     Mobility  Bed Mobility Overal bed mobility: Modified Independent Bed Mobility: Supine to Sit     Supine to sit: Modified independent (Device/Increase time)     General bed mobility comments: used bed rail    Transfers Overall transfer level: Needs assistance Equipment used: Rolling walker (2 wheels) Transfers: Sit to/from Stand Sit to Stand: Min guard, Min assist            General transfer comment: VCs hand placement sit to stand; min A to control descent for stand to sit    Ambulation/Gait Ambulation/Gait assistance: Min guard, Min assist Gait Distance (Feet): 35 Feet Assistive device: Rolling walker (2 wheels) Gait Pattern/deviations: Step-through pattern, Decreased stride length, Trunk flexed Gait velocity: decreased     General Gait Details: ongoing cues for proximity to RW, pt unable to maintain safe proximity to walker despite ongoing verbal cues (was walking too far behind it with significant trunk flexion),  pt fatigued very quickly, significantly shorter distance today compared to yesterday   Stairs             Wheelchair Mobility    Modified Rankin (Stroke Patients Only)       Balance Overall balance assessment: Needs assistance Sitting-balance support: No upper extremity supported, Feet supported Sitting balance-Leahy Scale: Good     Standing balance support: No upper extremity supported Standing balance-Leahy Scale: Poor                              Cognition Arousal/Alertness: Awake/alert Behavior During Therapy: WFL for tasks assessed/performed Overall Cognitive Status: Within Functional Limits for tasks assessed                                          Exercises      General Comments        Pertinent Vitals/Pain Pain Assessment Pain Assessment: No/denies pain    Home Living  Prior Function            PT Goals (current goals can now be found in the care plan section) Acute Rehab PT Goals Patient Stated Goal: Regain IND, likes to sew PT Goal Formulation: With patient Time For Goal Achievement: 01/30/21 Potential to Achieve Goals: Fair Progress towards PT goals: Progressing toward goals    Frequency    Min 2X/week      PT Plan Discharge plan needs to be updated    Co-evaluation              AM-PAC PT "6 Clicks" Mobility    Outcome Measure  Help needed turning from your back to your side while in a flat bed without using bedrails?: None Help needed moving from lying on your back to sitting on the side of a flat bed without using bedrails?: None Help needed moving to and from a bed to a chair (including a wheelchair)?: A Little Help needed standing up from a chair using your arms (e.g., wheelchair or bedside chair)?: A Little Help needed to walk in hospital room?: A Little Help needed climbing 3-5 steps with a railing? : A Little 6 Click Score: 20    End of Session Equipment Utilized During Treatment: Gait belt Activity Tolerance: Patient tolerated treatment well Patient left: in chair;with call bell/phone within reach;with family/visitor present Nurse Communication: Mobility status PT Visit Diagnosis: Other abnormalities of gait and mobility (R26.89);Muscle weakness (generalized) (M62.81)     Time: 1030-1045 PT Time Calculation (min) (ACUTE ONLY): 15 min  Charges:  $Gait Training: 8-22 mins                     Blondell Reveal Kistler PT 03/31/2022  Acute Rehabilitation Services  Office [phone]

## 2022-03-31 NOTE — Progress Notes (Signed)
Unfortunately, the white cell count is starting to drop.  White cell count now is 1700.  Her ANC is 1.3.  I really think we probably need to get her on Neupogen.  Her monocytes are quite low so I do not think that her marrow is going to recover anytime soon.  Her platelet count is 33,000.  I will still keep her on low-dose Lovenox.  She says her mouth is feeling a little bit better.  She still has little bit of a cough.  We may have to repeat a chest x-ray on her tomorrow.  She is still quite weak.  She has had no headache.  Somehow she thinks that she has a limitation as to what she can eat.  I told her that she can eat anything that she would like.  Calories are incredibly important for her right now.  She has had no nausea or vomiting.  There is no diarrhea.  She has had no bleeding.  Her vital signs show temperature of 98.9.  Pulse 88.  Blood pressure 111/56.  Her head neck exam shows some persistent mucositis in the buccal mucosa.  She has no adenopathy in the neck.  Lungs are clear bilaterally.  She has good air movement bilaterally.  Cardiac exam regular rate and rhythm.  Abdomen is soft.  Bowel sounds are present.  There is no fluid wave.  There is no palpable liver or spleen tip.  Extremity shows no clubbing, cyanosis or edema.  Skin exam shows no rashes, ecchymosis or petechia.  Neurological exam is nonfocal.  Again, Ariel [name] is recovering from her high-dose methotrexate protocol.  Her methotrexate levels are below threshold now.  However, she is now suffering from the leukopenia.  Again I will start Neupogen on her.  We really are not able to get her to a SNF.  We will have to get a chest x-ray tomorrow.  I know that the staff upon [address] are doing a great job with her.  I appreciate their help.  [name], MD  Psalm 46:10

## 2022-03-31 NOTE — Progress Notes (Signed)
Pharmacy Antibiotic Note  -  cefepime consult  Pharmacy has been assisting with dosing of cefepime for febrile neutropenia. Renal function remains stable. Dosage remains stable at Cefepime 2 gm iv every 8 hours and need for further dosage adjustment appears unlikely at present.    Will sign off at this time.  Please reconsult if a change in clinical status warrants re-evaluation of dosage.   Mendel Ryder, PharmD  03/31/22  1021

## 2022-04-01 ENCOUNTER — Inpatient Hospital Stay (HOSPITAL_COMMUNITY): Payer: Medicare Other

## 2022-04-01 DIAGNOSIS — D702 Other drug-induced agranulocytosis: Secondary | ICD-10-CM

## 2022-04-01 DIAGNOSIS — R4701 Aphasia: Secondary | ICD-10-CM | POA: Diagnosis not present

## 2022-04-01 DIAGNOSIS — E876 Hypokalemia: Secondary | ICD-10-CM

## 2022-04-01 DIAGNOSIS — K1231 Oral mucositis (ulcerative) due to antineoplastic therapy: Secondary | ICD-10-CM | POA: Diagnosis not present

## 2022-04-01 DIAGNOSIS — E440 Moderate protein-calorie malnutrition: Secondary | ICD-10-CM | POA: Diagnosis not present

## 2022-04-01 DIAGNOSIS — R059 Cough, unspecified: Secondary | ICD-10-CM | POA: Diagnosis not present

## 2022-04-01 DIAGNOSIS — C8332 Diffuse large B-cell lymphoma, intrathoracic lymph nodes: Secondary | ICD-10-CM | POA: Diagnosis not present

## 2022-04-01 DIAGNOSIS — E871 Hypo-osmolality and hyponatremia: Secondary | ICD-10-CM | POA: Diagnosis not present

## 2022-04-01 DIAGNOSIS — R748 Abnormal levels of other serum enzymes: Secondary | ICD-10-CM

## 2022-04-01 DIAGNOSIS — D72810 Lymphocytopenia: Secondary | ICD-10-CM

## 2022-04-01 DIAGNOSIS — C8330 Diffuse large B-cell lymphoma, unspecified site: Secondary | ICD-10-CM | POA: Diagnosis not present

## 2022-04-01 DIAGNOSIS — D61818 Other pancytopenia: Secondary | ICD-10-CM

## 2022-04-01 DIAGNOSIS — D696 Thrombocytopenia, unspecified: Secondary | ICD-10-CM

## 2022-04-01 DIAGNOSIS — C8589 Other specified types of non-Hodgkin lymphoma, extranodal and solid organ sites: Secondary | ICD-10-CM | POA: Diagnosis not present

## 2022-04-01 LAB — BASIC METABOLIC PANEL
Anion gap: 8 (ref 5–15)
BUN: 17 mg/dL (ref 8–23)
CO2: 22 mmol/L (ref 22–32)
Calcium: 8.5 mg/dL — ABNORMAL LOW (ref 8.9–10.3)
Chloride: 99 mmol/L (ref 98–111)
Creatinine, Ser: 0.59 mg/dL (ref 0.44–1.00)
GFR, Estimated: >60 mL/min (ref >60)
Glucose, Bld: 114 mg/dL — ABNORMAL HIGH (ref 70–99)
Potassium: 3.3 mmol/L — ABNORMAL LOW (ref 3.5–5.1)
Sodium: 129 mmol/L — ABNORMAL LOW (ref 135–145)

## 2022-04-01 LAB — CBC WITH DIFFERENTIAL/PLATELET
Abs Immature Granulocytes: 0.02 10*3/uL (ref 0.00–0.07)
Basophils Absolute: 0 10*3/uL (ref 0.0–0.1)
Basophils Relative: 0 %
Eosinophils Absolute: 0 10*3/uL (ref 0.0–0.5)
Eosinophils Relative: 0 %
HCT: 27.2 % — ABNORMAL LOW (ref 36.0–46.0)
Hemoglobin: 9.2 g/dL — ABNORMAL LOW (ref 12.0–15.0)
Immature Granulocytes: 1 %
Lymphocytes Relative: 16 %
Lymphs Abs: 0.2 10*3/uL — ABNORMAL LOW (ref 0.7–4.0)
MCH: 31.9 pg (ref 26.0–34.0)
MCHC: 33.8 g/dL (ref 30.0–36.0)
MCV: 94.4 fL (ref 80.0–100.0)
Monocytes Absolute: 0.1 10*3/uL (ref 0.1–1.0)
Monocytes Relative: 4 %
Neutro Abs: 1.1 10*3/uL — ABNORMAL LOW (ref 1.7–7.7)
Neutrophils Relative %: 79 %
Platelets: 24 10*3/uL — CL (ref 150–400)
RBC: 2.88 MIL/uL — ABNORMAL LOW (ref 3.87–5.11)
RDW: 12.3 % (ref 11.5–15.5)
WBC: 1.4 10*3/uL — CL (ref 4.0–10.5)
nRBC: 0 % (ref 0.0–0.2)

## 2022-04-01 LAB — PHOSPHORUS: Phosphorus: 2.7 mg/dL (ref 2.5–4.6)

## 2022-04-01 LAB — MAGNESIUM: Magnesium: 2 mg/dL (ref 1.7–2.4)

## 2022-04-01 MED ORDER — BENZONATATE 100 MG PO CAPS
100.0000 mg | ORAL_CAPSULE | ORAL | Status: DC
  Administered 2022-04-01 – 2022-04-14 (×77): 100 mg via ORAL
  Filled 2022-04-01 (×69): qty 1

## 2022-04-01 MED ORDER — POTASSIUM CHLORIDE 20 MEQ PO PACK
40.0000 meq | PACK | ORAL | Status: AC
  Administered 2022-04-01 (×2): 40 meq via ORAL
  Filled 2022-04-01 (×2): qty 2

## 2022-04-01 NOTE — Plan of Care (Signed)
  Problem: Education: Goal: Knowledge of General Education information will improve Description Including pain rating scale, medication(s)/side effects and non-pharmacologic comfort measures Outcome: Progressing   

## 2022-04-01 NOTE — Progress Notes (Signed)
Patient has been transported to x-ray for chest x-ray.

## 2022-04-01 NOTE — Progress Notes (Signed)
Ms. Strack says that she feels little better.  She is quite weak.  She is getting physical therapy and Occupational Therapy.  White cell count is still trending downward.  I started her on Neupogen yesterday.  White cell count today is 1.4.  Her platelet count is 24,000.  She is still on the low-dose Lovenox.  I will keep her on this for right now.  Her mouth is healing up a little bit better.  Mucositis does not appear to be as significant.  Her cultures are all negative.  She is on Maxipime for right now.  Her cough may be a little bit better.  We will see what the chest x-ray shows.  She has no headache.  She did have a bowel movement yesterday.  She has had no nausea or vomiting.  Hopefully, she will eat a little better today.  Her vital signs show temperature of 97.9.  Pulse 73.  Blood pressure 120/65.  Her head neck exam shows improvement mucositis in the buccal mucosa.  There is no adenopathy in the neck.  Lungs are clear bilaterally.  I do not hear any wheezing.  Cardiac exam regular rate and rhythm.  She has no murmurs.  Abdominal exam is soft.  She has good bowel sounds.  There is no fluid wave.  There is no palpable liver or spleen tip.  Extremity shows no clubbing, cyanosis or edema.  Neurological exam shows no focal neurological deficits.  Skin exam shows no rashes, ecchymosis or petechia.  At this point, we have to await the blood counts to improve.  She is on Neupogen.  Hopefully, we will see her white cell count trending upward.  We have to wait for her to be placed at SNF.  Nutrition is still very important.  Hopefully, she will still be able to eat well.  I do appreciate the great care that she is getting from everybody up on 6 E.  [name], MD  Lurena Joiner 1:37

## 2022-04-01 NOTE — Progress Notes (Signed)
Mobility Specialist - Progress Note   04/01/22 1118  Mobility  Activity Ambulated with assistance in room  Level of Assistance Standby assist, set-up cues, supervision of patient - no hands on  Assistive Device Front wheel walker  Distance Ambulated (ft) 40 ft  Range of Motion/Exercises Active  Activity Response Tolerated well  Mobility Referral Yes  $Mobility charge 1 Mobility   Pt was found in bed and agreeable to ambulate in room. C/o being tired prior to session beginning and had a persistent cough throughout session. Towards EOS became a little unsteady and at EOS was left in bed with all necessities in reach and bed alarm on.   Ferd Hibbs Mobility Specialist

## 2022-04-01 NOTE — Progress Notes (Signed)
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  CT head revealed increased edema in the area of left frontal lobe lesion with no acute hemorrhage or evidence of CVA.  MRI of brain showed progression of lymphoma in the left frontal operculum.  EEG without epileptiform discharge or seizure.  Oncology, radiation oncology and neuro-oncology involved.  Patient was started on chemotherapy on 1/23 and completed first cycle.   Hospital course with new onset leukopenia with lymphopenia and mild neutropenia and worsening anemia and thrombocytopenia likely from chemotherapy she started on 1/23.  She also had intermittent fever raising concern for neutropenic fever.  Urine culture on 1/22 with E. coli for which she completed antibiotic course.  Blood culture NGTD.  Remains on IV cefepime for neutropenic fever.  Blood cultures NGTD.  Oncology following.  Therapy recommended SNF due to significant physical deconditioning.    Subjective: Seen and examined earlier this morning.  No major events overnight of this morning.  Reports feeling better today.  Feels stronger.  Still with dry cough but no shortness of breath or chest pain.  Denies GI or UTI symptoms.  Denies focal neurosymptoms.  Objective: Vitals:   03/30/22 2016 03/31/22 0537 03/31/22 1323 03/31/22 2113  BP: 116/60 124/62 (!) 111/56 120/65  Pulse: 72 93 88 73  Resp: '18 18 15 18  '$ Temp: 98.1 F (36.7 C) 99.7 F (37.6 C) 98.9 F (37.2 C) 97.9 F (36.6 C)  TempSrc: Oral Oral Oral Oral  SpO2: 97% 94% 97% 96%  Weight:      Height:        Examination:  GENERAL: No  apparent distress.  Nontoxic. HEENT: MMM.  Vision and hearing grossly intact.  NECK: Supple.  No apparent JVD.  RESP:  No IWOB.  Fair aeration bilaterally. CVS:  RRR. Heart sounds normal.  ABD/GI/GU: BS+. Abd soft, NTND.  MSK/EXT:  Moves extremities. No apparent deformity. No edema.  SKIN: no apparent skin lesion or wound NEURO: Awake, alert and oriented appropriately.  No apparent focal neuro deficit. PSYCH: Calm. Normal affect.   Procedures:  None  Microbiology summarized: 1/19-COVID-19, influenza and RSV PCR nonreactive 1/22-urine culture with pansensitive E. Coli 1/31-blood cultures NGTD  Assessment and plan: Active Problems:   Hyperlipidemia   Melanoma ([place])   High grade B-cell lymphoma (HCC)   Diffuse large B cell lymphoma (HCC)   CNS lymphoma ([place])   Aphasia   Palliative care by specialist   Malnutrition of moderate degree  High-grade non-Hodgkin's lymphoma with brain involvement: Follows with Dr. Marin Olp.  Also follows with radiation oncology, neuro-oncology.  MRI finding as above.  EEG without any evidence of epileptiform discharge.  Started on chemotherapy in 1/23,completed first cycle. -Oncology following.  Aphasia with right-sided weakness: due to left frontal lobe CNS lymphoma lesion with worsening edema.  Seems to have resolved. -Continue Decadron 4 mg daily   Febrile illness: Spiked fever to 102 on 1/30.  No new respiratory symptoms.  Chest x-ray with atelectasis.  She has new leukopenia with significant lymphopenia and mild neutropenia.  Fever curve down-trending.  Empirically started on cefepime. Blood cultures NGTD.  Recently completed  antibiotic course for E. coli UTI. -Continue IV cefepime  Pancytopenia with new lymphopenia/mild neutropenia, worsening anemia and thrombocytopenia: Due to chemotherapy?  Gradually worse.  Oncology following -Defer to oncology. -Antibiotics as above  Hyponatremia: Urine sodium of 145 suggesting SIADH. -Continue salt  tablets.  -Monitor   Mucositis: Could be from chemotherapy.  Improving. -Continue oral rinse  Hypokalemia/hypophosphatemia:  -Monitor replenish as appropriate   Elevated liver enzymes: Likely due to chemotherapy. -Monitor   Cough: CXR  showed bibasilar subsegmental atelectasis.  Lung exam reassuring. -Continue to encourage using incentive spirometer.remains on room air -Follow repeat CXR   Mucositis: Has some discomfort in the oral cavity.  Continue oral ringe   Generalized weakness: Multifactorial.  Improving but only walking about 40 feet with mobility take -Therapy recommended SNF..  Moderate malnutrition Body mass index is 21.1 kg/m. Nutrition Problem: Moderate Malnutrition Etiology: chronic illness, cancer and cancer related treatments Signs/Symptoms: mild fat depletion, mild muscle depletion Interventions: Ensure Enlive (each supplement provides 350kcal and 20 grams of protein), MVI, Magic cup   DVT prophylaxis:  enoxaparin (LOVENOX) injection 40 mg Start: 03/20/22 1700  Code Status: Full code Family Communication: Updated patient's son and daughter at bedside Level of care: Med-Surg Status is: Inpatient Remains inpatient appropriate because: Worsening pancytopenia, febrile illness and physical deconditioning   Final disposition: SNF left Consultants:  Oncology, radiation oncology and neuro-oncology  35 minutes with more than 50% spent in reviewing records, counseling patient/family and coordinating care.   Sch Meds:  Scheduled Meds:  antiseptic oral rinse  15 mL Mouth Rinse Q6H   benzonatate  100 mg Oral Q4H   Chlorhexidine Gluconate Cloth  6 each Topical Daily   dexamethasone (DECADRON) injection  4 mg Intravenous Daily   enoxaparin (LOVENOX) injection  40 mg Subcutaneous Q24H   famciclovir  500 mg Oral Daily   famotidine  20 mg Oral Daily   feeding supplement  237 mL Oral TID BM   filgrastim  480 mcg Subcutaneous q1800   fluconazole  100 mg Oral Daily    multivitamin with minerals  1 tablet Oral Daily   naphazoline-glycerin  2 drop Both Eyes Q8H   phosphorus  250 mg Oral TID   potassium chloride  40 mEq Oral Q4H   sodium bicarbonate/sodium chloride   Mouth Rinse Q4H   sodium chloride flush  10-40 mL Intracatheter Q12H   sodium chloride  1 g Oral TID WC   Continuous Infusions:  ceFEPime (MAXIPIME) IV 2 g (04/01/22 1023)   PRN Meds:.acetaminophen **OR** [DISCONTINUED] acetaminophen, Hot Pack, phenol, polyethylene glycol, prochlorperazine, senna-docusate, sodium chloride flush, traMADol  Antimicrobials: Anti-infectives (From admission, onward)    Start     Dose/Rate Route Frequency Ordered Stop   03/29/22 0800  ceFEPIme (MAXIPIME) 2 g in sodium chloride 0.9 % 100 mL IVPB        2 g 200 mL/hr over 30 Minutes Intravenous Every 8 hours 03/29/22 0727     03/27/22 1000  fluconazole (DIFLUCAN) tablet 100 mg        100 mg Oral Daily 03/27/22 0701     03/25/22 1000  fluconazole (DIFLUCAN) tablet 200 mg  Status:  Discontinued        200 mg Oral Daily 03/25/22 0649 03/27/22 0701   03/25/22 1000  famciclovir (FAMVIR) tablet 500 mg        500 mg Oral Daily 03/25/22 0649     03/23/22 1000  cephALEXin (KEFLEX) capsule 500 mg  500 mg Oral Every 12 hours 03/22/22 1056 03/27/22 2116   03/22/22 0800  ciprofloxacin (CIPRO) tablet 500 mg  Status:  Discontinued        500 mg Oral 2 times daily 03/22/22 0635 03/22/22 0703   03/22/22 0800  cefTRIAXone (ROCEPHIN) 1 g in sodium chloride 0.9 % 100 mL IVPB  Status:  Discontinued        1 g 200 mL/hr over 30 Minutes Intravenous Every 24 hours 03/22/22 0703 03/22/22 1056        I have personally reviewed the following labs and images: CBC: Recent Labs  Lab 03/27/22 0600 03/28/22 0847 03/29/22 0846 03/30/22 0500 03/31/22 0315 04/01/22 0450  WBC 7.2 4.2 5.4 2.4* 1.7* 1.4*  NEUTROABS 6.7  --  4.8 1.9 1.3* 1.1*  HGB 11.1* 11.1* 11.3* 9.6* 9.5* 9.2*  HCT 32.4* 32.4* 33.1* 27.8* 26.8* 27.2*   MCV 93.4 94.5 94.3 93.9 92.7 94.4  PLT 95* 75* 67* 39* 33* 24*   BMP &GFR Recent Labs  Lab 03/28/22 0500 03/29/22 0500 03/30/22 0500 03/31/22 0315 04/01/22 0450  NA 128* 127* 126* 130* 129*  K 4.2 3.6 3.8 3.7 3.3*  CL 93* 94* 97* 96* 99  CO2 25 22 20* 23 22  GLUCOSE 99 135* 119* 93 114*  BUN '18 21 18 16 17  '$ CREATININE 0.59 0.62 0.65 0.49 0.59  CALCIUM 9.4 8.7* 8.6* 8.6* 8.5*  MG 2.2 2.1 1.9 1.9 2.0  PHOS 2.8 2.7 2.3* 2.6 2.7   Estimated Creatinine Clearance: 65.8 mL/min (by C-G formula based on SCr of 0.59 mg/dL). Liver & Pancreas: Recent Labs  Lab 03/26/22 0520 03/27/22 0600 03/28/22 0500 03/29/22 0500  AST 46* 32 32 32  ALT 125* 94* 78* 62*  ALKPHOS 57 59 64 62  BILITOT 0.4 0.5 0.3 0.4  PROT 5.6* 5.7* 6.5 6.5  ALBUMIN 2.9* 2.9* 3.0* 2.9*   No results for input(s): "LIPASE", "AMYLASE" in the last 168 hours. No results for input(s): "AMMONIA" in the last 168 hours. Diabetic: No results for input(s): "HGBA1C" in the last 72 hours. Recent Labs  Lab 03/25/22 1637 03/26/22 0729 03/26/22 1633 03/27/22 0725 03/28/22 0734  GLUCAP 179* 98 204* 97 104*   Cardiac Enzymes: No results for input(s): "CKTOTAL", "CKMB", "CKMBINDEX", "TROPONINI" in the last 168 hours. No results for input(s): "PROBNP" in the last 8760 hours. Coagulation Profile: No results for input(s): "INR", "PROTIME" in the last 168 hours. Thyroid Function Tests: No results for input(s): "TSH", "T4TOTAL", "FREET4", "T3FREE", "THYROIDAB" in the last 72 hours. Lipid Profile: No results for input(s): "CHOL", "HDL", "LDLCALC", "TRIG", "CHOLHDL", "LDLDIRECT" in the last 72 hours. Anemia Panel: No results for input(s): "VITAMINB12", "FOLATE", "FERRITIN", "TIBC", "IRON", "RETICCTPCT" in the last 72 hours. Urine analysis:    Component Value Date/Time   COLORURINE YELLOW 03/29/2022 0500   APPEARANCEUR CLOUDY (A) 03/29/2022 0500   LABSPEC 1.020 03/29/2022 0500   PHURINE 6.0 03/29/2022 0500   GLUCOSEU  NEGATIVE 03/29/2022 0500   GLUCOSEU NEGATIVE 11/30/2020 1218   HGBUR TRACE (A) 03/29/2022 0500   BILIRUBINUR NEGATIVE 03/29/2022 0500   BILIRUBINUR neg 06/15/2020 1333   KETONESUR NEGATIVE 03/29/2022 0500   PROTEINUR 30 (A) 03/29/2022 0500   UROBILINOGEN 0.2 11/30/2020 1218   NITRITE NEGATIVE 03/29/2022 0500   LEUKOCYTESUR TRACE (A) 03/29/2022 0500   Sepsis Labs: Invalid input(s): "PROCALCITONIN", "LACTICIDVEN"  Microbiology: Recent Results (from the past 240 hour(s))  Culture, blood (Routine X 2) w Reflex to ID Panel     Status: None (  Preliminary result)   Collection Time: 03/29/22  8:46 AM   Specimen: BLOOD  Result Value Ref Range Status   Specimen Description   Final    BLOOD CENTRAL LINE BLOOD RIGHT ARM Performed at [facility], [place] [address] [place], [place] [address]    Special Requests   Final    BOTTLES DRAWN AEROBIC AND ANAEROBIC Blood Culture adequate volume Performed at [facility] [address], [place], [place] [address]    Culture   Final    NO GROWTH 3 DAYS Performed at [facility], [place] [address] [place], [place] [address]    Report Status PENDING  Incomplete  Culture, blood (Routine X 2) w Reflex to ID Panel     Status: None (Preliminary result)   Collection Time: 03/29/22  8:52 AM   Specimen: BLOOD  Result Value Ref Range Status   Specimen Description   Final    BLOOD Line BLOOD LEFT ARM Performed at [facility] [address], [place], [place] [address]    Special Requests   Final    BOTTLES DRAWN AEROBIC AND ANAEROBIC Blood Culture adequate volume Performed at [facility] [address] [place], [place] [address]    Culture   Final    NO GROWTH 3 DAYS Performed at [facility], [place] [address], [place], [place] [address]    Report Status PENDING  Incomplete    Radiology Studies: No results found.    Andrell Bergeson T. [place]  If  7PM-7AM, please contact night-coverage [email or web address] 04/01/2022, 11:46 AM

## 2022-04-02 DIAGNOSIS — C8589 Other specified types of non-Hodgkin lymphoma, extranodal and solid organ sites: Secondary | ICD-10-CM | POA: Diagnosis not present

## 2022-04-02 DIAGNOSIS — C8332 Diffuse large B-cell lymphoma, intrathoracic lymph nodes: Secondary | ICD-10-CM | POA: Diagnosis not present

## 2022-04-02 DIAGNOSIS — E440 Moderate protein-calorie malnutrition: Secondary | ICD-10-CM | POA: Diagnosis not present

## 2022-04-02 DIAGNOSIS — R4701 Aphasia: Secondary | ICD-10-CM | POA: Diagnosis not present

## 2022-04-02 LAB — CBC WITH DIFFERENTIAL/PLATELET
Abs Immature Granulocytes: 0 10*3/uL (ref 0.00–0.07)
Basophils Absolute: 0 10*3/uL (ref 0.0–0.1)
Basophils Relative: 0 %
Eosinophils Absolute: 0 10*3/uL (ref 0.0–0.5)
Eosinophils Relative: 0 %
HCT: 26.7 % — ABNORMAL LOW (ref 36.0–46.0)
Hemoglobin: 9.2 g/dL — ABNORMAL LOW (ref 12.0–15.0)
Lymphocytes Relative: 15 %
Lymphs Abs: 0.1 10*3/uL — ABNORMAL LOW (ref 0.7–4.0)
MCH: 32.4 pg (ref 26.0–34.0)
MCHC: 34.5 g/dL (ref 30.0–36.0)
MCV: 94 fL (ref 80.0–100.0)
Monocytes Absolute: 0 10*3/uL — ABNORMAL LOW (ref 0.1–1.0)
Monocytes Relative: 5 %
Neutro Abs: 0.6 10*3/uL — ABNORMAL LOW (ref 1.7–7.7)
Neutrophils Relative %: 80 %
Platelets: 21 10*3/uL — CL (ref 150–400)
RBC: 2.84 MIL/uL — ABNORMAL LOW (ref 3.87–5.11)
RDW: 12.3 % (ref 11.5–15.5)
WBC: 0.7 10*3/uL — CL (ref 4.0–10.5)
nRBC: 0 % (ref 0.0–0.2)

## 2022-04-02 LAB — COMPREHENSIVE METABOLIC PANEL
ALT: 86 U/L — ABNORMAL HIGH (ref 0–44)
AST: 48 U/L — ABNORMAL HIGH (ref 15–41)
Albumin: 2.4 g/dL — ABNORMAL LOW (ref 3.5–5.0)
Alkaline Phosphatase: 60 U/L (ref 38–126)
Anion gap: 10 (ref 5–15)
BUN: 19 mg/dL (ref 8–23)
CO2: 23 mmol/L (ref 22–32)
Calcium: 8.6 mg/dL — ABNORMAL LOW (ref 8.9–10.3)
Chloride: 99 mmol/L (ref 98–111)
Creatinine, Ser: 0.58 mg/dL (ref 0.44–1.00)
GFR, Estimated: >60 mL/min (ref >60)
Glucose, Bld: 145 mg/dL — ABNORMAL HIGH (ref 70–99)
Potassium: 3.6 mmol/L (ref 3.5–5.1)
Sodium: 132 mmol/L — ABNORMAL LOW (ref 135–145)
Total Bilirubin: 0.4 mg/dL (ref 0.3–1.2)
Total Protein: 5.7 g/dL — ABNORMAL LOW (ref 6.5–8.1)

## 2022-04-02 LAB — MAGNESIUM: Magnesium: 1.9 mg/dL (ref 1.7–2.4)

## 2022-04-02 LAB — PHOSPHORUS: Phosphorus: 1.9 mg/dL — ABNORMAL LOW (ref 2.5–4.6)

## 2022-04-02 MED ORDER — GUAIFENESIN ER 600 MG PO TB12
600.0000 mg | ORAL_TABLET | Freq: Two times a day (BID) | ORAL | Status: DC
  Administered 2022-04-02 – 2022-04-04 (×4): 600 mg via ORAL
  Filled 2022-04-02 (×6): qty 1

## 2022-04-02 MED ORDER — MENTHOL 3 MG MT LOZG
1.0000 | LOZENGE | OROMUCOSAL | Status: DC | PRN
  Filled 2022-04-02: qty 9

## 2022-04-02 MED ORDER — GUAIFENESIN-DM 100-10 MG/5ML PO SYRP: 5.0000 mL | ORAL_SOLUTION | ORAL | Status: DC | PRN

## 2022-04-02 NOTE — Progress Notes (Signed)
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  CT head revealed increased edema in the area of left frontal lobe lesion with no acute hemorrhage or evidence of CVA.  MRI of brain showed progression of lymphoma in the left frontal operculum.  EEG without epileptiform discharge or seizure.  Oncology, radiation oncology and neuro-oncology involved.  Patient was started on chemotherapy on 1/23 and completed first cycle.   Hospital course with new onset leukopenia with lymphopenia and mild neutropenia and worsening anemia and thrombocytopenia likely from chemotherapy she started on 1/23.  She also had intermittent fever raising concern for neutropenic fever.  Urine culture on 1/22 with E. coli for which she completed antibiotic course.  Blood culture NGTD.  Remains on IV cefepime for neutropenic fever.  Blood cultures NGTD.  Oncology following.  Therapy recommended SNF due to significant physical deconditioning.    Subjective: Seen and examined earlier this morning.  No major events overnight of this morning.  Reports feeling better other than dry cough.  Denies shortness of breath or chest pain.  Denies GI or UTI symptoms.  Objective: Vitals:   03/31/22 1323 03/31/22 2113 04/01/22 1509 04/01/22 2125  BP: (!) 111/56 120/65  127/67  Pulse: 88 73  68  Resp: '15 18  18  '$ Temp: 98.9 F (37.2 C) 97.9 F (36.6 C) 99.6 F (37.6 C) 97.9 F (36.6 C)  TempSrc: Oral Oral Oral Oral  SpO2: 97% 96%  96%  Weight:      Height:        Examination:  GENERAL: No apparent distress.  Nontoxic. HEENT: MMM.  Vision and hearing grossly  intact.  NECK: Supple.  No apparent JVD.  RESP:  No IWOB.  Fair aeration bilaterally.  Frequently coughing during exam. CVS:  RRR. Heart sounds normal.  ABD/GI/GU: BS+. Abd soft, NTND.  MSK/EXT:  Moves extremities. No apparent deformity. No edema.  SKIN: no apparent skin lesion or wound NEURO: Awake and alert. Oriented appropriately.  No apparent focal neuro deficit. PSYCH: Calm. Normal affect.   Procedures:  None  Microbiology summarized: 1/19-COVID-19, influenza and RSV PCR nonreactive 1/22-urine culture with pansensitive E. Coli 1/31-blood cultures NGTD  Assessment and plan: Active Problems:   Hyperlipidemia   Melanoma ([place])   High grade B-cell lymphoma ([facility])   Diffuse large B cell lymphoma ([facility])   CNS lymphoma ([place])   Aphasia   Palliative care by specialist   Malnutrition of moderate degree  High-grade non-Hodgkin's lymphoma with brain involvement: Follows with Dr. Marin Olp.  Also follows with radiation oncology, neuro-oncology.  MRI finding as above.  EEG without any evidence of epileptiform discharge.  Started on chemotherapy on 1/23, and completed first cycle. -Oncology following.  Aphasia with right-sided weakness: due to left frontal lobe CNS lymphoma lesion with worsening edema.  Seems to have resolved. -Continue Decadron 4 mg daily -SLP eval.   Febrile illness: Spiked fever to 102 on 1/30.  No new respiratory symptoms other than dry cough.  CXR on 1/30 suggested atelectasis.  CXR on 2/3 with patchy bibasilar pneumonia, left > right.  She has worsening leukopenia with neutropenia and for cytopenia.  However, fever curve downtrending and resolving. Empirically started on cefepime. Blood cultures NGTD.  Recently completed antibiotic course for E. coli UTI. -Continue IV cefepime -SLP eval. has history of dysphagia  Pancytopenia with new lymphopenia/mild neutropenia, worsening anemia and thrombocytopenia: Likely due to chemotherapy.  Gradually worse.  Oncology  following -Defer to oncology. -Antibiotics as above  Hyponatremia: Urine sodium of 145 suggesting SIADH.  Improving. -Continue salt tablets.  -Monitor   Mucositis: Could be from chemotherapy.  Improving. -Continue oral rinse  Hypokalemia/hypophosphatemia:  -Monitor replenish as appropriate   Elevated liver enzymes: Likely due to chemotherapy. -Continue monitoring   Cough: CXR as above.  Carries history of dysphagia.  Came in with aphasia.  No shortness of breath or chest pain.  Remains on room air. -Encourage incentive spirometry -SLP eval   Mucositis: Has some discomfort in the oral cavity.  Continue oral ringe   Generalized weakness: Multifactorial.  Improving but still weak. -Therapy recommended SNF..  Moderate malnutrition Body mass index is 21.1 kg/m. Nutrition Problem: Moderate Malnutrition Etiology: chronic illness, cancer and cancer related treatments Signs/Symptoms: mild fat depletion, mild muscle depletion Interventions: Ensure Enlive (each supplement provides 350kcal and 20 grams of protein), MVI, Magic cup   DVT prophylaxis:  enoxaparin (LOVENOX) injection 40 mg Start: 03/20/22 1700  Code Status: Full code Family Communication: None at bedside today. Level of care: Med-Surg Status is: Inpatient Remains inpatient appropriate because: Worsening pancytopenia, febrile illness and physical deconditioning   Final disposition: SNF left Consultants:  Oncology, radiation oncology and neuro-oncology  35 minutes with more than 50% spent in reviewing records, counseling patient/family and coordinating care.   Sch Meds:  Scheduled Meds:  antiseptic oral rinse  15 mL Mouth Rinse Q6H   benzonatate  100 mg Oral Q4H   Chlorhexidine Gluconate Cloth  6 each Topical Daily   dexamethasone (DECADRON) injection  4 mg Intravenous Daily   enoxaparin (LOVENOX) injection  40 mg Subcutaneous Q24H   famciclovir  500 mg Oral Daily   famotidine  20 mg Oral Daily   feeding  supplement  237 mL Oral TID BM   filgrastim  480 mcg Subcutaneous q1800   fluconazole  100 mg Oral Daily   guaiFENesin  600 mg Oral BID   multivitamin with minerals  1 tablet Oral Daily   naphazoline-glycerin  2 drop Both Eyes Q8H   phosphorus  250 mg Oral TID   sodium bicarbonate/sodium chloride   Mouth Rinse Q4H   sodium chloride flush  10-40 mL Intracatheter Q12H   sodium chloride  1 g Oral TID WC   Continuous Infusions:  ceFEPime (MAXIPIME) IV 2 g (04/02/22 0300)   PRN Meds:.acetaminophen **OR** [DISCONTINUED] acetaminophen, guaiFENesin-dextromethorphan, Hot Pack, menthol-cetylpyridinium, phenol, polyethylene glycol, prochlorperazine, senna-docusate, sodium chloride flush, traMADol  Antimicrobials: Anti-infectives (From admission, onward)    Start     Dose/Rate Route Frequency Ordered Stop   03/29/22 0800  ceFEPIme (MAXIPIME) 2 g in sodium chloride 0.9 % 100 mL IVPB        2 g 200 mL/hr over 30 Minutes Intravenous Every 8 hours 03/29/22 0727     03/27/22 1000  fluconazole (DIFLUCAN) tablet 100 mg        100 mg Oral Daily 03/27/22 0701     03/25/22 1000  fluconazole (DIFLUCAN) tablet 200 mg  Status:  Discontinued        200 mg Oral Daily 03/25/22 0649 03/27/22 0701   03/25/22 1000  famciclovir (FAMVIR) tablet 500 mg  500 mg Oral Daily 03/25/22 0649     03/23/22 1000  cephALEXin (KEFLEX) capsule 500 mg        500 mg Oral Every 12 hours 03/22/22 1056 03/27/22 2116   03/22/22 0800  ciprofloxacin (CIPRO) tablet 500 mg  Status:  Discontinued        500 mg Oral 2 times daily 03/22/22 0635 03/22/22 0703   03/22/22 0800  cefTRIAXone (ROCEPHIN) 1 g in sodium chloride 0.9 % 100 mL IVPB  Status:  Discontinued        1 g 200 mL/hr over 30 Minutes Intravenous Every 24 hours 03/22/22 0703 03/22/22 1056        I have personally reviewed the following labs and images: CBC: Recent Labs  Lab 03/29/22 0846 03/30/22 0500 03/31/22 0315 04/01/22 0450 04/02/22 0300  WBC 5.4 2.4*  1.7* 1.4* 0.7*  NEUTROABS 4.8 1.9 1.3* 1.1* 0.6*  HGB 11.3* 9.6* 9.5* 9.2* 9.2*  HCT 33.1* 27.8* 26.8* 27.2* 26.7*  MCV 94.3 93.9 92.7 94.4 94.0  PLT 67* 39* 33* 24* 21*   BMP &GFR Recent Labs  Lab 03/29/22 0500 03/30/22 0500 03/31/22 0315 04/01/22 0450 04/02/22 0300  NA 127* 126* 130* 129* 132*  K 3.6 3.8 3.7 3.3* 3.6  CL 94* 97* 96* 99 99  CO2 22 20* '23 22 23  '$ GLUCOSE 135* 119* 93 114* 145*  BUN '21 18 16 17 19  '$ CREATININE 0.62 0.65 0.49 0.59 0.58  CALCIUM 8.7* 8.6* 8.6* 8.5* 8.6*  MG 2.1 1.9 1.9 2.0 1.9  PHOS 2.7 2.3* 2.6 2.7 1.9*   Estimated Creatinine Clearance: 65.8 mL/min (by C-G formula based on SCr of 0.58 mg/dL). Liver & Pancreas: Recent Labs  Lab 03/27/22 0600 03/28/22 0500 03/29/22 0500 04/02/22 0300  AST 32 32 32 48*  ALT 94* 78* 62* 86*  ALKPHOS 59 64 62 60  BILITOT 0.5 0.3 0.4 0.4  PROT 5.7* 6.5 6.5 5.7*  ALBUMIN 2.9* 3.0* 2.9* 2.4*   No results for input(s): "LIPASE", "AMYLASE" in the last 168 hours. No results for input(s): "AMMONIA" in the last 168 hours. Diabetic: No results for input(s): "HGBA1C" in the last 72 hours. Recent Labs  Lab 03/26/22 1633 03/27/22 0725 03/28/22 0734  GLUCAP 204* 97 104*   Cardiac Enzymes: No results for input(s): "CKTOTAL", "CKMB", "CKMBINDEX", "TROPONINI" in the last 168 hours. No results for input(s): "PROBNP" in the last 8760 hours. Coagulation Profile: No results for input(s): "INR", "PROTIME" in the last 168 hours. Thyroid Function Tests: No results for input(s): "TSH", "T4TOTAL", "FREET4", "T3FREE", "THYROIDAB" in the last 72 hours. Lipid Profile: No results for input(s): "CHOL", "HDL", "LDLCALC", "TRIG", "CHOLHDL", "LDLDIRECT" in the last 72 hours. Anemia Panel: No results for input(s): "VITAMINB12", "FOLATE", "FERRITIN", "TIBC", "IRON", "RETICCTPCT" in the last 72 hours. Urine analysis:    Component Value Date/Time   COLORURINE YELLOW 03/29/2022 0500   APPEARANCEUR CLOUDY (A) 03/29/2022 0500    LABSPEC 1.020 03/29/2022 0500   PHURINE 6.0 03/29/2022 0500   GLUCOSEU NEGATIVE 03/29/2022 0500   GLUCOSEU NEGATIVE 11/30/2020 1218   HGBUR TRACE (A) 03/29/2022 0500   BILIRUBINUR NEGATIVE 03/29/2022 0500   BILIRUBINUR neg 06/15/2020 1333   KETONESUR NEGATIVE 03/29/2022 0500   PROTEINUR 30 (A) 03/29/2022 0500   UROBILINOGEN 0.2 11/30/2020 1218   NITRITE NEGATIVE 03/29/2022 0500   LEUKOCYTESUR TRACE (A) 03/29/2022 0500   Sepsis Labs: Invalid input(s): "PROCALCITONIN", "LACTICIDVEN"  Microbiology: Recent Results (from the past 240 hour(s))  Culture, blood (Routine X 2) w Reflex  to ID Panel     Status: None (Preliminary result)   Collection Time: 03/29/22  8:46 AM   Specimen: BLOOD  Result Value Ref Range Status   Specimen Description   Final    BLOOD CENTRAL LINE BLOOD RIGHT ARM Performed at [facility], [place] [address], [place], [place] [address]    Special Requests   Final    BOTTLES DRAWN AEROBIC AND ANAEROBIC Blood Culture adequate volume Performed at [facility] [address] [place] [address] [place], [place] [address]    Culture   Final    NO GROWTH 4 DAYS Performed at [facility], [place] [address] [place], [place] [address]    Report Status PENDING  Incomplete  Culture, blood (Routine X 2) w Reflex to ID Panel     Status: None (Preliminary result)   Collection Time: 03/29/22  8:52 AM   Specimen: BLOOD  Result Value Ref Range Status   Specimen Description   Final    BLOOD Line BLOOD LEFT ARM Performed at [facility] [address] [place], [place] [address]    Special Requests   Final    BOTTLES DRAWN AEROBIC AND ANAEROBIC Blood Culture adequate volume Performed at [facility] [address], [place], [place] [address]    Culture   Final    NO GROWTH 4 DAYS Performed at [facility], [place] [address], [place], [place] [address]    Report Status PENDING  Incomplete    Radiology  Studies: DG Chest 2 View  Result Date: 04/01/2022 CLINICAL DATA:  Cough, weakness, history of CVA, history of lymphoma EXAM: CHEST - 2 VIEW COMPARISON:  03/28/2022 FINDINGS: Frontal and lateral views of the chest demonstrate a stable cardiac silhouette. Right chest wall port via internal jugular approach tip within the region of the superior vena cava. There is patchy bibasilar airspace disease, left greater than right. No effusion or pneumothorax. No acute bony abnormalities. IMPRESSION: 1. Patchy bibasilar pneumonia, left greater than right. Electronically Signed   By: Randa Ngo M.D.   On: 04/01/2022 15:49      Areonna Bran T. [place]  If 7PM-7AM, please contact night-coverage [email or web address] 04/02/2022, 10:41 AM

## 2022-04-02 NOTE — Evaluation (Signed)
Clinical/Bedside Swallow Evaluation Patient Details  Name: Ariel [name] MRN: [id number] Date of Birth: 14-Jan-1956  Today's Date: 04/02/2022 Time: SLP Start Time (ACUTE ONLY): 6767 SLP Stop Time (ACUTE ONLY): 2094 SLP Time Calculation (min) (ACUTE ONLY): 11 min  Past Medical History:  Past Medical History:  Diagnosis Date   Arthritis    Chicken pox    Complication of anesthesia    Per patient, very slow to wake up after anesthesia   Goals of care, counseling/discussion 12/22/2020   High cholesterol    High grade B-cell lymphoma ([place]) 12/22/2020   Hyperglycemia 11/10/2020   Melanoma ([place]) 2021   right hip   Melanoma ([place]) 10/28/2020   pt stated brain liver and bladder   PONV (postoperative nausea and vomiting)    Stroke [facility])    Urinary incontinence    Past Surgical History:  Past Surgical History:  Procedure Laterality Date   AIR/FLUID EXCHANGE Right 12/07/2020   Procedure: AIR/FLUID EXCHANGE;  Surgeon: Jalene Mullet, MD;  Location: [place];  Service: Ophthalmology;  Laterality: Right;   APPENDECTOMY  1962   BIOPSY  12/14/2020   Procedure: BIOPSY;  Surgeon: Rush Landmark Telford Nab., MD;  Location: [place];  Service: Gastroenterology;;   ESOPHAGOGASTRODUODENOSCOPY (EGD) WITH PROPOFOL N/A 12/14/2020   Procedure: ESOPHAGOGASTRODUODENOSCOPY (EGD) WITH PROPOFOL;  Surgeon: Irving Copas., MD;  Location: [place];  Service: Gastroenterology;  Laterality: N/A;   EXCISION MELANOMA WITH SENTINEL LYMPH NODE BIOPSY Right 10/09/2019   Procedure: WIDE LOCAL EXCISION WITH ADVANCEMENT FLAP CLOSURE RIGHT HIP MELANOMA WITH SENTINEL LYMPH NODE BIOPSY;  Surgeon: Stark Klein, MD;  Location: [place];  Service: General;  Laterality: Right;   FINE NEEDLE ASPIRATION  12/14/2020   Procedure: FINE NEEDLE ASPIRATION (FNA) LINEAR;  Surgeon: Irving Copas., MD;  Location: [place];  Service: Gastroenterology;;   HERNIA REPAIR  2009   IR IMAGING GUIDED PORT INSERTION  12/30/2020    MOUTH SURGERY  11/2015   gum surgery and bone implant   PARS PLANA VITRECTOMY Right 12/07/2020   Procedure: PARS PLANA VITRECTOMY WITH 25 GAUGE;  Surgeon: Jalene Mullet, MD;  Location: [place];  Service: Ophthalmology;  Laterality: Right;   PHOTOCOAGULATION WITH LASER Right 12/07/2020   Procedure: PHOTOCOAGULATION WITH ENDOLASER PANRITINAL COAGULATION;  Surgeon: Jalene Mullet, MD;  Location: [place];  Service: Ophthalmology;  Laterality: Right;   TOTAL HIP ARTHROPLASTY Left 10/11/2021   Procedure: LEFT TOTAL HIP ARTHROPLASTY ANTERIOR APPROACH;  Surgeon: [place] Nakayama, MD;  [facility];  Service: Orthopedics;  Laterality: Left;   TOTAL HIP ARTHROPLASTY Right 01/10/2022   Procedure: RIGHT TOTAL HIP ARTHROPLASTY ANTERIOR APPROACH;  Surgeon: [place] Nakayama, MD;  [facility];  Service: Orthopedics;  Laterality: Right;   UPPER ESOPHAGEAL ENDOSCOPIC ULTRASOUND (EUS) N/A 12/14/2020   Procedure: UPPER ESOPHAGEAL ENDOSCOPIC ULTRASOUND (EUS);  Surgeon: Irving Copas., MD;  Location: [place];  Service: Gastroenterology;  Laterality: N/A;   WISDOM TOOTH EXTRACTION     HPI:  Ariel [name] is a 67 year old female who  presented 03/17/22 with  right facial droop, dysarthria, aphasia. MRI 1/19 showed recurrence non-Hodgkin's lymphoma in left frontal operculum; left cerebellar peduncel edema. Swallowing evaluation ordered due to some concerns for potential aspiration.  Chart notes hx of dysphagia; she has never been evaluated for dysphagia in [place]. PMH-CNS lymphoma, melanoma of the skin, high-grade B-cell lymphoma.    Assessment / Plan / Recommendation  Clinical Impression  Ariel [name] participated in a clinical swallow assessment. She endorsed pain in oral cavity related  to ulcerations secondary to chemo. Oral mechanism exam revealed irritation of tissues lateral sulci, ulcer noted inside right buccal cavity. No focal CN deficits. Pt reports avoiding certain foods that  aggravate her discomfort - this leads her to choosing soft foods or broths. She demonstrated adequate oral manipulation of soft solids, no residue post-swallow, no s/s of aspiration when drinking sequential swallows of thin liquids. No evidence of dysphagia.  Recommend continuing regular diet to optimize choices; continue thin liquids; meds with water. There are no further SLP needs - our service will sign off. Pt and RN agree with plan. SLP Visit Diagnosis: Dysphagia, unspecified (R13.10)    Aspiration Risk  No limitations    Diet Recommendation   Per pt's discretion (regular/thin)  Medication Administration: Whole meds with liquid    Other  Recommendations Oral Care Recommendations: Oral care BID    Recommendations for follow up therapy are one component of a multi-disciplinary discharge planning process, led by the attending physician.  Recommendations may be updated based on patient status, additional functional criteria and insurance authorization.  Follow up Recommendations No SLP follow up        [place] Date of Onset: 03/17/22 HPI: Ariel [name] is a 67 year old female who  presented 03/17/22 with  right facial droop, dysarthria, aphasia. MRI 1/19 showed recurrence non-Hodgkin's lymphoma in left frontal operculum; left cerebellar peduncel edema. Swallowing evaluation ordered due to some concerns for potentiall aspiration.  Chart notes hx of dysphagia in; she has never been evaluated for dysphagia in [place]. PMH-CNS lymphoma, melanoma of the skin, high-grade B-cell lymphoma. Type of Study: Bedside Swallow Evaluation Previous Swallow Assessment: no Diet Prior to this Study: Regular;Thin liquids Temperature Spikes Noted: No Respiratory Status: Room air History of Recent Intubation: No Behavior/Cognition: Alert;Cooperative;Pleasant mood Oral Cavity Assessment: Lesions Oral Care Completed by SLP: Recent completion by staff Oral Cavity - Dentition: Adequate  natural dentition Vision: Functional for self-feeding Self-Feeding Abilities: Able to feed self Patient Positioning: Upright in bed Baseline Vocal Quality: Normal Volitional Cough: Strong Volitional Swallow: Able to elicit    Oral/Motor/Sensory Function Overall Oral Motor/Sensory Function: Within functional limits   Ice Chips Ice chips: Within functional limits   Thin Liquid Thin Liquid: Within functional limits    Nectar Thick Nectar Thick Liquid: Not tested   Honey Thick Honey Thick Liquid: Not tested   Puree Puree: Within functional limits   Solid     Solid: Not tested      Juan Quam Laurice 04/02/2022,3:42 PM   Ariel Bamberg L. Tivis Ringer, MA CCC/SLP Clinical Specialist - [place] Office number [phone]

## 2022-04-03 DIAGNOSIS — C8332 Diffuse large B-cell lymphoma, intrathoracic lymph nodes: Secondary | ICD-10-CM | POA: Diagnosis not present

## 2022-04-03 DIAGNOSIS — D72819 Decreased white blood cell count, unspecified: Secondary | ICD-10-CM

## 2022-04-03 DIAGNOSIS — E440 Moderate protein-calorie malnutrition: Secondary | ICD-10-CM | POA: Diagnosis not present

## 2022-04-03 DIAGNOSIS — C8589 Other specified types of non-Hodgkin lymphoma, extranodal and solid organ sites: Secondary | ICD-10-CM | POA: Diagnosis not present

## 2022-04-03 DIAGNOSIS — R4701 Aphasia: Secondary | ICD-10-CM | POA: Diagnosis not present

## 2022-04-03 DIAGNOSIS — K1231 Oral mucositis (ulcerative) due to antineoplastic therapy: Secondary | ICD-10-CM | POA: Diagnosis not present

## 2022-04-03 DIAGNOSIS — Z79899 Other long term (current) drug therapy: Secondary | ICD-10-CM | POA: Diagnosis not present

## 2022-04-03 DIAGNOSIS — C8330 Diffuse large B-cell lymphoma, unspecified site: Secondary | ICD-10-CM | POA: Diagnosis not present

## 2022-04-03 LAB — CBC WITH DIFFERENTIAL/PLATELET
Abs Immature Granulocytes: 0 10*3/uL (ref 0.00–0.07)
Basophils Absolute: 0 10*3/uL (ref 0.0–0.1)
Basophils Relative: 1 %
Eosinophils Absolute: 0 10*3/uL (ref 0.0–0.5)
Eosinophils Relative: 0 %
HCT: 25.9 % — ABNORMAL LOW (ref 36.0–46.0)
Hemoglobin: 8.8 g/dL — ABNORMAL LOW (ref 12.0–15.0)
Immature Granulocytes: 0 %
Lymphocytes Relative: 23 %
Lymphs Abs: 0.2 10*3/uL — ABNORMAL LOW (ref 0.7–4.0)
MCH: 32.1 pg (ref 26.0–34.0)
MCHC: 34 g/dL (ref 30.0–36.0)
MCV: 94.5 fL (ref 80.0–100.0)
Monocytes Absolute: 0.1 10*3/uL (ref 0.1–1.0)
Monocytes Relative: 7 %
Neutro Abs: 0.6 10*3/uL — ABNORMAL LOW (ref 1.7–7.7)
Neutrophils Relative %: 69 %
Platelets: 19 10*3/uL — CL (ref 150–400)
RBC: 2.74 MIL/uL — ABNORMAL LOW (ref 3.87–5.11)
RDW: 12.6 % (ref 11.5–15.5)
WBC: 0.8 10*3/uL — CL (ref 4.0–10.5)
nRBC: 0 % (ref 0.0–0.2)

## 2022-04-03 LAB — CULTURE, BLOOD (ROUTINE X 2)
Culture: NO GROWTH
Culture: NO GROWTH
Special Requests: ADEQUATE
Special Requests: ADEQUATE

## 2022-04-03 LAB — MAGNESIUM: Magnesium: 2 mg/dL (ref 1.7–2.4)

## 2022-04-03 LAB — CK: Total CK: 14 U/L — ABNORMAL LOW (ref 38–234)

## 2022-04-03 LAB — PHOSPHORUS: Phosphorus: 2.2 mg/dL — ABNORMAL LOW (ref 2.5–4.6)

## 2022-04-03 MED ORDER — GUAIFENESIN-DM 100-10 MG/5ML PO SYRP
5.0000 mL | ORAL_SOLUTION | ORAL | Status: DC | PRN
  Filled 2022-04-03: qty 5

## 2022-04-03 NOTE — Progress Notes (Signed)
Thankfully, the white cell count is trending up.  Today her white count is 800.  Have to believe that I will start to come up.  Her hemoglobin is 8.8.  We will have to watch this closely.  I would not transfuse her yet.  The mouth sores are improving.  Hopefully, her appetite will improve.  Platelet count is 19,000.  I would probably stop the Lovenox for right now.  She has had no fever.  Her chest x-ray did show some infiltrates.  She is on antibiotics.  She says that the [employer] really help her out.  Her electrolytes are still pending.  She says that she sat in the chair yesterday.  I think she may have done a little bit of physical therapy in the room.  She has had no bleeding.  She has had no diarrhea.  There has been no skin rashes.  Maybe, we can stop the steroid on her and see how she does off steroid.  Her vital signs are temperature of 99.  Pulse 73.  Blood pressure 113/60.  Her head neck exam shows no ocular or oral lesions.  The mucositis is markedly improved.  Lungs are clear bilaterally.  Cardiac exam regular rate and rhythm.  She has no murmurs.  Abdomen is soft.  Bowel sounds are present.  There are no fluid wave.  There is no palpable liver or spleen tip.  Extremity shows no clubbing, cyanosis or edema.  Neurological exam is nonfocal.  We are awaiting her white cell count to trend upward.  Hopefully she will start to increase more quickly now.  I would like to think that her platelet count will begin to improve once we stop the Neupogen.  She still is on IV antibiotics.  She is still not ready for discharge to skilled nursing.  Her phosphorus is still on the low side.  She is taking oral K-Phos.  I do appreciate the incredible care she is getting from everybody up on 6 E.  [name], MD  Philippians 4:13

## 2022-04-03 NOTE — Progress Notes (Signed)
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  CT head revealed increased edema in the area of left frontal lobe lesion with no acute hemorrhage or evidence of CVA.  MRI of brain showed progression of lymphoma in the left frontal operculum.  EEG without epileptiform discharge or seizure.  Oncology, radiation oncology and neuro-oncology involved.  Patient was started on chemotherapy on 1/23 and completed first cycle.   Hospital course with new onset leukopenia with lymphopenia and mild neutropenia and worsening anemia and thrombocytopenia likely from chemotherapy she started on 1/23.  She also had intermittent fever raising concern for neutropenic fever.  Urine culture on 1/22 with E. coli for which she completed antibiotic course.  Blood culture NGTD.  Remains on IV cefepime for neutropenic fever.  Blood cultures NGTD.  Oncology following.  Therapy recommended SNF due to significant physical deconditioning.    Subjective: Seen and examined earlier this morning.  No major events overnight of this morning.  No complaints but waiting on his Tessalon Perles.  She is anxious about her cough although not coughing currently.  Denies shortness of breath or chest pain.  Denies nausea, vomiting or abdominal pain.  No UTI symptoms.  Objective: Vitals:   04/01/22 2125 04/02/22 1600 04/02/22 2104 04/03/22 0442  BP: 127/67 132/70 109/61 113/60  Pulse: 68 70 69 73  Resp: '18 18 18 18  '$ Temp: 97.9 F (36.6 C) 98.2 F (36.8 C) 98.2 F (36.8 C) 99 F (37.2 C)  TempSrc: Oral Oral Oral Oral  SpO2: 96% 98% 98% 97%  Weight:       Height:        Examination:   GENERAL: No apparent distress.  Nontoxic. HEENT: MMM.  Vision and hearing grossly intact.  NECK: Supple.  No apparent JVD.  RESP:  No IWOB.  Fair aeration bilaterally. CVS:  RRR. Heart sounds normal.  ABD/GI/GU: BS+. Abd soft, NTND.  MSK/EXT:   No apparent deformity. Moves extremities. No edema.  SKIN: no apparent skin lesion or wound NEURO: Awake and alert. Oriented appropriately.  No apparent focal neuro deficit. PSYCH: Calm. Normal affect.   Procedures:  None  Microbiology summarized: 1/19-COVID-19, influenza and RSV PCR nonreactive 1/22-urine culture with pansensitive E. Coli 1/31-blood cultures NGTD  Assessment and plan: Active Problems:   Hyperlipidemia   Melanoma ([place])   High grade B-cell lymphoma ([facility])   Diffuse large B cell lymphoma ([facility])   CNS lymphoma ([place])   Aphasia   Palliative care by specialist   Malnutrition of moderate degree  High-grade non-Hodgkin's lymphoma with brain involvement: Follows with Dr. Marin Olp.  Also follows with radiation oncology, neuro-oncology.  MRI finding as above.  EEG without any evidence of epileptiform discharge.  Started on chemotherapy on 1/23, and completed first cycle. -Oncology following.  Aphasia with right-sided weakness: due to left frontal lobe CNS lymphoma lesion with worsening edema.  Seems to have resolved. -Continue Decadron 4 mg daily -Cleared by SLP from dysphagia standpoint   Febrile illness: Spiked fever to 102 on 1/30.  No new respiratory symptoms other than dry cough.  CXR on 1/30 suggested atelectasis.  CXR on 2/3  with patchy bibasilar pneumonia, left > right.  She has worsening leukopenia with neutropenia and for cytopenia.  However, fever curve downtrending and resolving. Empirically started on cefepime. Blood cultures NGTD.  Recently completed antibiotic course for E. coli UTI.  Low risk for aspiration per SLP. -Continue IV cefepime  Pancytopenia with new lymphopenia/mild  neutropenia, worsening anemia and thrombocytopenia: Likely due to chemotherapy.  Gradually worse.  Oncology following -Defer to oncology. -On Neupogen and famciclovir. -Antibiotics as above  Hyponatremia: Urine sodium of 145 suggesting SIADH.  Improving. -Continue salt tablets.  -Monitor   Mucositis: Likely from chemo.  Resolved.  Hypokalemia/hypophosphatemia:  -Monitor replenish as appropriate   Elevated liver enzymes: Likely due to chemotherapy. -Continue monitoring   Cough: CXR as above. Came in with aphasia and has history of dysphagia but cleared by SLP.  No shortness of breath or chest pain.  Remains on room air.  Cough seems to have improved. -Encourage incentive spirometry -Continue mucolytic's and antitussive.   Generalized weakness: Multifactorial.  Improving but still weak. -Therapy recommended SNF..  Moderate malnutrition Body mass index is 21.1 kg/m. Nutrition Problem: Moderate Malnutrition Etiology: chronic illness, cancer and cancer related treatments Signs/Symptoms: mild fat depletion, mild muscle depletion Interventions: Ensure Enlive (each supplement provides 350kcal and 20 grams of protein), MVI, Magic cup   DVT prophylaxis:    Code Status: Full code Family Communication: None at bedside today. Level of care: Med-Surg Status is: Inpatient Remains inpatient appropriate because: Worsening pancytopenia/neutropenia, febrile illness and physical deconditioning   Final disposition: SNF once cleared by oncology Consultants:  Oncology, radiation oncology and neuro-oncology  35 minutes with more than 50% spent in reviewing records, counseling patient/family and coordinating care.   Sch Meds:  Scheduled Meds:  antiseptic oral rinse  15 mL Mouth Rinse Q6H   benzonatate  100 mg Oral Q4H   Chlorhexidine Gluconate Cloth  6 each Topical Daily   famciclovir  500 mg Oral Daily   famotidine  20 mg Oral Daily   feeding supplement  237 mL Oral TID BM    filgrastim  480 mcg Subcutaneous q1800   fluconazole  100 mg Oral Daily   guaiFENesin  600 mg Oral BID   multivitamin with minerals  1 tablet Oral Daily   naphazoline-glycerin  2 drop Both Eyes Q8H   phosphorus  250 mg Oral TID   sodium bicarbonate/sodium chloride   Mouth Rinse Q4H   sodium chloride flush  10-40 mL Intracatheter Q12H   sodium chloride  1 g Oral TID WC   Continuous Infusions:  ceFEPime (MAXIPIME) IV 2 g (04/03/22 0941)   PRN Meds:.acetaminophen **OR** [DISCONTINUED] acetaminophen, Hot Pack, polyethylene glycol, prochlorperazine, senna-docusate, sodium chloride flush, traMADol  Antimicrobials: Anti-infectives (From admission, onward)    Start     Dose/Rate Route Frequency Ordered Stop   03/29/22 0800  ceFEPIme (MAXIPIME) 2 g in sodium chloride 0.9 % 100 mL IVPB        2 g 200 mL/hr over 30 Minutes Intravenous Every 8 hours 03/29/22 0727     03/27/22 1000  fluconazole (DIFLUCAN) tablet 100 mg        100 mg Oral Daily 03/27/22 0701     03/25/22 1000  fluconazole (DIFLUCAN) tablet 200 mg  Status:  Discontinued        200 mg Oral Daily 03/25/22 0649 03/27/22 0701   03/25/22 1000  famciclovir (FAMVIR) tablet 500 mg        500 mg Oral Daily 03/25/22 0649  03/23/22 1000  cephALEXin (KEFLEX) capsule 500 mg        500 mg Oral Every 12 hours 03/22/22 1056 03/27/22 2116   03/22/22 0800  ciprofloxacin (CIPRO) tablet 500 mg  Status:  Discontinued        500 mg Oral 2 times daily 03/22/22 0635 03/22/22 0703   03/22/22 0800  cefTRIAXone (ROCEPHIN) 1 g in sodium chloride 0.9 % 100 mL IVPB  Status:  Discontinued        1 g 200 mL/hr over 30 Minutes Intravenous Every 24 hours 03/22/22 0703 03/22/22 1056        I have personally reviewed the following labs and images: CBC: Recent Labs  Lab 03/30/22 0500 03/31/22 0315 04/01/22 0450 04/02/22 0300 04/03/22 0511  WBC 2.4* 1.7* 1.4* 0.7* 0.8*  NEUTROABS 1.9 1.3* 1.1* 0.6* 0.6*  HGB 9.6* 9.5* 9.2* 9.2* 8.8*  HCT 27.8*  26.8* 27.2* 26.7* 25.9*  MCV 93.9 92.7 94.4 94.0 94.5  PLT 39* 33* 24* 21* 19*   BMP &GFR Recent Labs  Lab 03/29/22 0500 03/30/22 0500 03/31/22 0315 04/01/22 0450 04/02/22 0300 04/03/22 0511  NA 127* 126* 130* 129* 132*  --   K 3.6 3.8 3.7 3.3* 3.6  --   CL 94* 97* 96* 99 99  --   CO2 22 20* '23 22 23  '$ --   GLUCOSE 135* 119* 93 114* 145*  --   BUN '21 18 16 17 19  '$ --   CREATININE 0.62 0.65 0.49 0.59 0.58  --   CALCIUM 8.7* 8.6* 8.6* 8.5* 8.6*  --   MG 2.1 1.9 1.9 2.0 1.9 2.0  PHOS 2.7 2.3* 2.6 2.7 1.9* 2.2*   Estimated Creatinine Clearance: 65.8 mL/min (by C-G formula based on SCr of 0.58 mg/dL). Liver & Pancreas: Recent Labs  Lab 03/28/22 0500 03/29/22 0500 04/02/22 0300  AST 32 32 48*  ALT 78* 62* 86*  ALKPHOS 64 62 60  BILITOT 0.3 0.4 0.4  PROT 6.5 6.5 5.7*  ALBUMIN 3.0* 2.9* 2.4*   No results for input(s): "LIPASE", "AMYLASE" in the last 168 hours. No results for input(s): "AMMONIA" in the last 168 hours. Diabetic: No results for input(s): "HGBA1C" in the last 72 hours. Recent Labs  Lab 03/28/22 0734  GLUCAP 104*   Cardiac Enzymes: Recent Labs  Lab 04/03/22 0511  CKTOTAL 14*   No results for input(s): "PROBNP" in the last 8760 hours. Coagulation Profile: No results for input(s): "INR", "PROTIME" in the last 168 hours. Thyroid Function Tests: No results for input(s): "TSH", "T4TOTAL", "FREET4", "T3FREE", "THYROIDAB" in the last 72 hours. Lipid Profile: No results for input(s): "CHOL", "HDL", "LDLCALC", "TRIG", "CHOLHDL", "LDLDIRECT" in the last 72 hours. Anemia Panel: No results for input(s): "VITAMINB12", "FOLATE", "FERRITIN", "TIBC", "IRON", "RETICCTPCT" in the last 72 hours. Urine analysis:    Component Value Date/Time   COLORURINE YELLOW 03/29/2022 0500   APPEARANCEUR CLOUDY (A) 03/29/2022 0500   LABSPEC 1.020 03/29/2022 0500   PHURINE 6.0 03/29/2022 0500   GLUCOSEU NEGATIVE 03/29/2022 0500   GLUCOSEU NEGATIVE 11/30/2020 1218   HGBUR TRACE  (A) 03/29/2022 0500   BILIRUBINUR NEGATIVE 03/29/2022 0500   BILIRUBINUR neg 06/15/2020 1333   KETONESUR NEGATIVE 03/29/2022 0500   PROTEINUR 30 (A) 03/29/2022 0500   UROBILINOGEN 0.2 11/30/2020 1218   NITRITE NEGATIVE 03/29/2022 0500   LEUKOCYTESUR TRACE (A) 03/29/2022 0500   Sepsis Labs: Invalid input(s): "PROCALCITONIN", "LACTICIDVEN"  Microbiology: Recent Results (from the past 240 hour(s))  Culture, blood (Routine X 2)  w Reflex to ID Panel     Status: None   Collection Time: 03/29/22  8:46 AM   Specimen: BLOOD  Result Value Ref Range Status   Specimen Description   Final    BLOOD CENTRAL LINE BLOOD RIGHT ARM Performed at [facility], [place] [address], [place], [place] [address]    Special Requests   Final    BOTTLES DRAWN AEROBIC AND ANAEROBIC Blood Culture adequate volume Performed at [facility] [address], [place], [place] [address]    Culture   Final    NO GROWTH 5 DAYS Performed at [facility], [place] [address] [place], [place] [address]    Report Status 04/03/2022 FINAL  Final  Culture, blood (Routine X 2) w Reflex to ID Panel     Status: None   Collection Time: 03/29/22  8:52 AM   Specimen: BLOOD  Result Value Ref Range Status   Specimen Description   Final    BLOOD Line BLOOD LEFT ARM Performed at [facility] [address], [place], [place] [address]    Special Requests   Final    BOTTLES DRAWN AEROBIC AND ANAEROBIC Blood Culture adequate volume Performed at [facility] [address], [place], [place] [address]    Culture   Final    NO GROWTH 5 DAYS Performed at [facility], [place] [address] [place], [place] [address]    Report Status 04/03/2022 FINAL  Final    Radiology Studies: No results found.    Ivis Nicolson T. [place]  If 7PM-7AM, please contact night-coverage [email or web address] 04/03/2022, 10:46 AM

## 2022-04-03 NOTE — TOC Progression Note (Signed)
Transition of Care [facility]) - Progression Note    Patient Details  Name: Ariel [name] MRN: [id number] Date of Birth: January 03, 1956  Transition [facility]) CM/SW [place], RN Phone [phone]  04/03/2022, 1:16 PM  Clinical Narrative:    TOC continues to follow for disposition planning. Rep from [facility] here to assess today for possible placement when patient is medically stable for discharge.    Expected Discharge Plan: [place] Barriers to Discharge: Continued Medical Work up  Expected Discharge Plan and Services In-house Referral: NA Discharge Planning Services: CM Consult Post Acute Care Choice: [place] Living arrangements for the past 2 months: Single Family Home                 DME Arranged: N/A DME Agency: NA       HH Arranged: NA HH Agency: NA         Social Determinants of Health (SDOH) Interventions SDOH Screenings   Food Insecurity: No Food Insecurity (03/18/2022)  Housing: Low Risk  (03/18/2022)  Transportation Needs: No Transportation Needs (03/18/2022)  Utilities: Not At Risk (03/18/2022)  Alcohol Screen: Low Risk  (07/08/2020)  Depression (PHQ2-9): Low Risk  (07/13/2021)  Financial Resource Strain: Low Risk  (07/13/2021)  Physical Activity: Insufficiently Active (07/13/2021)  Social Connections: Socially Isolated (07/13/2021)  Stress: No Stress Concern Present (07/13/2021)  Tobacco Use: Low Risk  (03/17/2022)    Readmission Risk Interventions    03/27/2022    3:00 PM 08/02/2021    3:11 PM 07/05/2021    2:17 PM  Readmission Risk Prevention Plan  Transportation Screening Complete Complete Complete  Medication Review Press photographer) Complete Complete Complete  PCP or Specialist appointment within 3-5 days of discharge Complete Complete Complete  HRI or [place] Complete Complete Complete  SW Recovery Care/Counseling Consult Complete Complete Complete  Palliative Care Screening Complete Not  Applicable Not [place] Complete Not Applicable Not Applicable

## 2022-04-03 NOTE — Progress Notes (Signed)
Physical Therapy Treatment Patient Details Name: Ariel [name] MRN: [id number] DOB: 13-Nov-1955 Today's Date: 04/03/2022   History of Present Illness patient is a 67 year old white female presented 03/17/22 with  right facial droop, dysarthria, aphasia. MRI showed recurrence non-Hodgkin's lymphoma in left frontal lymphoma lesion PMH-CNS lymphoma, melanoma of the skin, high-grade B-cell lymphoma    PT Comments    Pt motivated to participate in therapy, received supine in bed without complaint. Pt demonstrated modified independence for bed mobility and supervision for transfers; pt incontinent of urine and able to perform self-pericare and replacement of pad with at most single UE support on RW, pt sat EOB and stood for >67mn and at end of  task pt very fatigued and requesting to just transfer to recliner, further mobility deferred. Pt with dry cough throughout mobility, RN notified. Pt is interested in ALF vs SNF but at current functional level not updating discharge destination at this time. We will continue to follow acutely.   Recommendations for follow up therapy are one component of a multi-disciplinary discharge planning process, led by the attending physician.  Recommendations may be updated based on patient status, additional functional criteria and insurance authorization.  Follow Up Recommendations  Skilled nursing-short term rehab (<3 hours/day) (SNF vs. ALF with HHPT (pt motivated but fatigues quickly)) Can patient physically be transported by private vehicle: Yes   Assistance Recommended at Discharge Intermittent Supervision/Assistance  Patient can return home with the following Assistance with cooking/housework;Help with stairs or ramp for entrance;A little help with walking and/or transfers;A little help with bathing/dressing/bathroom   Equipment Recommendations  None recommended by PT    Recommendations for Other Services OT consult     Precautions / Restrictions  Precautions Precautions: Fall Restrictions Weight Bearing Restrictions: No     Mobility  Bed Mobility Overal bed mobility: Modified Independent Bed Mobility: Supine to Sit     Supine to sit: Modified independent (Device/Increase time)     General bed mobility comments: used bed rail    Transfers Overall transfer level: Needs assistance Equipment used: Rolling walker (2 wheels) Transfers: Sit to/from Stand Sit to Stand: Modified independent (Device/Increase time), From elevated surface   Step pivot transfers: Supervision       General transfer comment: Modified independent for bed mobility with use of R bed rail; supervision for transfer to recliner. Pt stood EOB and was incontinent of urine despite purewic in place; pt able to stand and perform pericare and replacement of brief+pad herself with only supervision, no physical assist required or overt LOB. After sitting EOB for ~560m and standing for pericare/doffing&donning, pt fatigued, requested step pivot transfer, further mobility deferred    Ambulation/Gait         Gait velocity: deferred, fatigue         Stairs             Wheelchair Mobility    Modified Rankin (Stroke Patients Only)       Balance Overall balance assessment: Needs assistance Sitting-balance support: No upper extremity supported, Feet supported Sitting balance-Leahy Scale: Good     Standing balance support: No upper extremity supported Standing balance-Leahy Scale: Fair Standing balance comment: Able to stand without BUE support and single UE support during self-pericare                            Cognition Arousal/Alertness: Awake/alert Behavior During Therapy: WFL for tasks assessed/performed Overall Cognitive Status: Within Functional Limits for tasks assessed  Exercises      General Comments        Pertinent Vitals/Pain Pain Assessment Pain  Assessment: No/denies pain Faces Pain Scale: No hurt Breathing: normal Negative Vocalization: none Facial Expression: smiling or inexpressive Body Language: relaxed Consolability: no need to console PAINAD Score: 0 Pain Location: L knee (posterior into calf) Pain Descriptors / Indicators: Sore, Tender Pain Intervention(s): Monitored during session, Repositioned, Limited activity within patient's tolerance    Home Living                          Prior Function            PT Goals (current goals can now be found in the care plan section) Acute Rehab PT Goals Patient Stated Goal: Regain IND, likes to sew PT Goal Formulation: With patient Time For Goal Achievement: 04/14/22 Potential to Achieve Goals: Fair Progress towards PT goals: Progressing toward goals    Frequency    Min 2X/week      PT Plan Discharge plan needs to be updated    Co-evaluation              AM-PAC PT "6 Clicks" Mobility   Outcome Measure  Help needed turning from your back to your side while in a flat bed without using bedrails?: None Help needed moving from lying on your back to sitting on the side of a flat bed without using bedrails?: None Help needed moving to and from a bed to a chair (including a wheelchair)?: A Little Help needed standing up from a chair using your arms (e.g., wheelchair or bedside chair)?: A Little Help needed to walk in hospital room?: A Little Help needed climbing 3-5 steps with a railing? : A Little 6 Click Score: 20    End of Session Equipment Utilized During Treatment: Gait belt Activity Tolerance: Patient limited by fatigue;No increased pain Patient left: in chair;with call bell/phone within reach Nurse Communication: Mobility status PT Visit Diagnosis: Other abnormalities of gait and mobility (R26.89);Muscle weakness (generalized) (M62.81) Pain - Right/Left: Left Pain - part of body: Knee;Leg     Time: 7510-2585 PT Time Calculation (min)  (ACUTE ONLY): 22 min  Charges:  $Therapeutic Activity: 8-22 mins                     Coolidge Breeze, PT, DPT [facility] Rehabilitation Department Office: [phone] Weekend pager: [phone]   Coolidge Breeze 04/03/2022, 11:28 AM

## 2022-04-04 DIAGNOSIS — J189 Pneumonia, unspecified organism: Secondary | ICD-10-CM | POA: Diagnosis not present

## 2022-04-04 DIAGNOSIS — E440 Moderate protein-calorie malnutrition: Secondary | ICD-10-CM | POA: Diagnosis not present

## 2022-04-04 DIAGNOSIS — C8589 Other specified types of non-Hodgkin lymphoma, extranodal and solid organ sites: Secondary | ICD-10-CM | POA: Diagnosis not present

## 2022-04-04 DIAGNOSIS — K1231 Oral mucositis (ulcerative) due to antineoplastic therapy: Secondary | ICD-10-CM | POA: Diagnosis not present

## 2022-04-04 DIAGNOSIS — C8332 Diffuse large B-cell lymphoma, intrathoracic lymph nodes: Secondary | ICD-10-CM | POA: Diagnosis not present

## 2022-04-04 DIAGNOSIS — C8330 Diffuse large B-cell lymphoma, unspecified site: Secondary | ICD-10-CM | POA: Diagnosis not present

## 2022-04-04 DIAGNOSIS — R4701 Aphasia: Secondary | ICD-10-CM | POA: Diagnosis not present

## 2022-04-04 LAB — COMPREHENSIVE METABOLIC PANEL
ALT: 83 U/L — ABNORMAL HIGH (ref 0–44)
AST: 45 U/L — ABNORMAL HIGH (ref 15–41)
Albumin: 2.2 g/dL — ABNORMAL LOW (ref 3.5–5.0)
Alkaline Phosphatase: 60 U/L (ref 38–126)
Anion gap: 10 (ref 5–15)
BUN: 17 mg/dL (ref 8–23)
CO2: 22 mmol/L (ref 22–32)
Calcium: 8.4 mg/dL — ABNORMAL LOW (ref 8.9–10.3)
Chloride: 96 mmol/L — ABNORMAL LOW (ref 98–111)
Creatinine, Ser: 0.54 mg/dL (ref 0.44–1.00)
GFR, Estimated: >60 mL/min (ref >60)
Glucose, Bld: 129 mg/dL — ABNORMAL HIGH (ref 70–99)
Potassium: 3.5 mmol/L (ref 3.5–5.1)
Sodium: 128 mmol/L — ABNORMAL LOW (ref 135–145)
Total Bilirubin: 0.7 mg/dL (ref 0.3–1.2)
Total Protein: 5.9 g/dL — ABNORMAL LOW (ref 6.5–8.1)

## 2022-04-04 LAB — CBC WITH DIFFERENTIAL/PLATELET
Abs Immature Granulocytes: 0 10*3/uL (ref 0.00–0.07)
Basophils Absolute: 0 10*3/uL (ref 0.0–0.1)
Basophils Relative: 2 %
Eosinophils Absolute: 0 10*3/uL (ref 0.0–0.5)
Eosinophils Relative: 1 %
HCT: 24.9 % — ABNORMAL LOW (ref 36.0–46.0)
Hemoglobin: 8.5 g/dL — ABNORMAL LOW (ref 12.0–15.0)
Immature Granulocytes: 0 %
Lymphocytes Relative: 14 %
Lymphs Abs: 0.2 10*3/uL — ABNORMAL LOW (ref 0.7–4.0)
MCH: 32.2 pg (ref 26.0–34.0)
MCHC: 34.1 g/dL (ref 30.0–36.0)
MCV: 94.3 fL (ref 80.0–100.0)
Monocytes Absolute: 0.1 10*3/uL (ref 0.1–1.0)
Monocytes Relative: 7 %
Neutro Abs: 1 10*3/uL — ABNORMAL LOW (ref 1.7–7.7)
Neutrophils Relative %: 76 %
Platelets: 18 10*3/uL — CL (ref 150–400)
RBC: 2.64 MIL/uL — ABNORMAL LOW (ref 3.87–5.11)
RDW: 12.8 % (ref 11.5–15.5)
WBC: 1.3 10*3/uL — CL (ref 4.0–10.5)
nRBC: 0 % (ref 0.0–0.2)

## 2022-04-04 LAB — PHOSPHORUS: Phosphorus: 2.3 mg/dL — ABNORMAL LOW (ref 2.5–4.6)

## 2022-04-04 LAB — RESP PANEL BY RT-PCR (RSV, FLU A&B, COVID)  RVPGX2
Influenza A by PCR: NEGATIVE
Influenza B by PCR: NEGATIVE
Resp Syncytial Virus by PCR: NEGATIVE
SARS Coronavirus 2 by RT PCR: POSITIVE — AB

## 2022-04-04 LAB — MAGNESIUM: Magnesium: 1.9 mg/dL (ref 1.7–2.4)

## 2022-04-04 LAB — PREPARE RBC (CROSSMATCH)

## 2022-04-04 MED ORDER — SODIUM CHLORIDE 0.9 % IV SOLN
500.0000 mg | INTRAVENOUS | Status: AC
  Administered 2022-04-04 – 2022-04-06 (×3): 500 mg via INTRAVENOUS
  Filled 2022-04-04 (×3): qty 5

## 2022-04-04 MED ORDER — POTASSIUM PHOSPHATES 15 MMOLE/5ML IV SOLN
15.0000 mmol | Freq: Once | INTRAVENOUS | Status: AC
  Administered 2022-04-04: 15 mmol via INTRAVENOUS
  Filled 2022-04-04: qty 5

## 2022-04-04 MED ORDER — SODIUM CHLORIDE 0.9% IV SOLUTION: Freq: Once | INTRAVENOUS | Status: AC

## 2022-04-04 MED ORDER — FUROSEMIDE 10 MG/ML IJ SOLN: 20.0000 mg | Freq: Once | INTRAMUSCULAR | Status: DC

## 2022-04-04 NOTE — Progress Notes (Signed)
Occupational Therapy Treatment Patient Details Name: Ariel [name] MRN: [id number] DOB: [birth year] Today's Date: 04/04/2022   History of present illness Patient is a 67 year old female presented 03/17/22 with  right facial droop, dysarthria, aphasia. MRI showed recurrence non-Hodgkin's lymphoma in left frontal lymphoma lesion PMH-CNS lymphoma, melanoma of the skin, high-grade B-cell lymphoma   OT comments  The patient deferred attempts at edge of bed or out of bed activity today, as she expressed concerns about urinary leakage. She stated she has someone bringing her some more absorbant pads by later today to help manage this & she prefers to hold off on out of bed activity until then. She was however agreeable to instruction on therapeutic exercises at bed level, to facilitate improved strengthening and endurance needed for progressive ADL performance. She performed multiple reps and 1 set of such exercises as bicep curls and lateral pulls using an exercise band, as well as knee flexion/extension, and ankle pumps. She will continue to benefit from further OT services to maximize her safety and independence with self-care tasks.    Recommendations for follow up therapy are one component of a multi-disciplinary discharge planning process, led by the attending physician.  Recommendations may be updated based on patient status, additional functional criteria and insurance authorization.    Follow Up Recommendations  Skilled nursing-short term rehab (<3 hours/day vs. [place] Recommended at Discharge Intermittent Supervision/Assistance  Patient can return home with the following  A little help with bathing/dressing/bathroom;Assistance with cooking/housework;Direct supervision/assist for medications management;Direct supervision/assist for financial management;Assist for transportation   Equipment Recommendations  None recommended by OT       Precautions / Restrictions  Precautions Precautions: Fall Restrictions Weight Bearing Restrictions: No       Mobility Bed Mobility      General bed mobility comments: unable to assess, as the pt deferred    Transfers      General transfer comment: unable to assess, as the pt deferred         ADL either performed or assessed with clinical judgement   ADL   Eating/Feeding: Independent;Sitting   Grooming: Set up;Sitting           Upper Body Dressing : Set up;Sitting   Lower Body Dressing: Min guard;Sit to/from stand Lower Body Dressing Details (indicate cue type and reason): based on clinical judgement                    Praxis      Cognition Arousal/Alertness: Awake/alert Behavior During Therapy: WFL for tasks assessed/performed Overall Cognitive Status: Within Functional Limits for tasks assessed                   Pertinent Vitals/ Pain       Pain Assessment Pain Assessment: No/denies pain         Frequency  Min 2X/week        Progress Toward Goals  OT Goals(current goals can now be found in the care plan section)  Progress towards OT goals: Progressing toward goals  Acute Rehab OT Goals OT Goal Formulation: With patient Time For Goal Achievement: 04/15/22 Potential to Achieve Goals: Good  Plan Discharge plan remains appropriate    AM-PAC OT "6 Clicks" Daily Activity     Outcome Measure   Help from another person eating meals?: None Help from another person taking care of personal grooming?: A Little Help from another person toileting, which includes using toliet, bedpan, or urinal?: A  Little Help from another person bathing (including washing, rinsing, drying)?: A Little Help from another person to put on and taking off regular upper body clothing?: None Help from another person to put on and taking off regular lower body clothing?: A Little 6 Click Score: 20    End of Session    OT Visit Diagnosis: Unsteadiness on feet (R26.81);Muscle weakness  (generalized) (M62.81)   Activity Tolerance     Patient Left in bed;with call bell/phone within reach;with bed alarm set   Nurse Communication Mobility status        Time: 2957-4734 OT Time Calculation (min): 10 min  Charges: OT General Charges $OT Visit: 1 Visit OT Treatments $Therapeutic Exercise: 8-22 mins    Leota Sauers, OTR/L 04/04/2022, 10:02 AM

## 2022-04-04 NOTE — Progress Notes (Signed)
Patient continued to spike fever despite IV cefepime.  She tested positive for COVID-19.  RVP pending.  I think it is reasonable to discontinue IV cefepime at this point.  Started Zithromax earlier.  Will complete 3 days course.  No indication for steroid or remdesivir.

## 2022-04-04 NOTE — Progress Notes (Signed)
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  CT head revealed increased edema in the area of left frontal lobe lesion with no acute hemorrhage or evidence of CVA.  MRI of brain showed progression of lymphoma in the left frontal operculum.  EEG without epileptiform discharge or seizure.  Oncology, radiation oncology and neuro-oncology involved.  Patient was started on chemotherapy on 1/23 and completed first cycle.   Hospital course with new onset leukopenia with lymphopenia and mild neutropenia and worsening anemia and thrombocytopenia likely from chemotherapy she started on 1/23.  She also had intermittent fever raising concern for neutropenic fever.  CXR raises concern for pneumonia.  Urine culture on 1/22 with E. coli for which she completed antibiotic course.  Blood culture NGTD.  Continues to spike fever intermittently.  Remains on IV cefepime for neutropenic fever.  Blood cultures NGTD.  Oncology following.  Therapy recommended SNF due to significant physical deconditioning.    Subjective: Seen and examined earlier this morning.  She is spiked fever to 102.6 about 8 PM last night.  Reports improvement in her cough.  Denies shortness of breath, chest pain, GI or UTI symptoms.  Objective: Vitals:   04/03/22 2203 04/03/22 2350 04/04/22 0333 04/04/22 0946  BP: (!) 99/56 (!) 103/54 (!) 113/55 (!) 109/53  Pulse: 84 82 75 82  Resp: '14 16 14 16  '$ Temp: 100.2 F (37.9 C) 98.4 F (36.9 C) 98.5 F (36.9 C) 98.8 F (37.1 C)  TempSrc: Oral Oral Oral Oral  SpO2: 94% 96% 95% 98%  Weight:      Height:         Examination:  GENERAL: No apparent distress.  Nontoxic. HEENT: MMM.  Vision and hearing grossly intact.  NECK: Supple.  No apparent JVD.  RESP:  No IWOB.  Fair aeration bilaterally. CVS:  RRR. Heart sounds normal.  ABD/GI/GU: BS+. Abd soft, NTND.  MSK/EXT:   No apparent deformity. Moves extremities. No edema.  SKIN: no apparent skin lesion or wound NEURO: Awake and alert. Oriented appropriately.  No apparent focal neuro deficit. PSYCH: Calm. Normal affect.   Procedures:  None  Microbiology summarized: 1/19-COVID-19, influenza and RSV PCR nonreactive 1/22-urine culture with pansensitive E. Coli 1/31-blood cultures NGTD  Assessment and plan: Active Problems:   Hyperlipidemia   Melanoma ([place])   High grade B-cell lymphoma ([facility])   Diffuse large B cell lymphoma ([facility])   CNS lymphoma ([place])   Aphasia   Palliative care by specialist   Malnutrition of moderate degree  High-grade non-Hodgkin's lymphoma with brain involvement: Follows with Dr. Marin Olp.  Also follows with radiation oncology, neuro-oncology.  MRI finding as above.  EEG without any evidence of epileptiform discharge.  Started on chemotherapy on 1/23, and completed first cycle. -Oncology following.  Aphasia with right-sided weakness: due to left frontal lobe CNS lymphoma lesion with worsening edema.  Resolved. -Continue Decadron 4 mg daily -SLP recommended regular diet.   Febrile illness/concern for pneumonia: Empirically started on cefepime but continues to spike intermittent fever.  She has dry cough but no other respiratory symptoms.  CXR is concerning for bibasilar pneumonia.  Blood cultures NGTD.  Leukopenia started to improve.  Low risk for aspiration per SLP, and recommended regular diet. -Continue IV cefepime  Pancytopenia with new lymphopenia/mild neutropenia, worsening anemia and thrombocytopenia: Likely due to chemotherapy.  Oncology following -On Neupogen and famciclovir per oncology. -Oncology ordered 1  unit of PRBC -Antibiotics as above  Hyponatremia: Urine sodium of 145 suggesting SIADH.  Relatively stable. -Continue salt tablets.  -Monitor   Mucositis: Likely from chemo.  Resolved.  Hypokalemia/hypophosphatemia:  -Monitor replenish as appropriate   Elevated liver enzymes: Likely due to chemotherapy. -Continue monitoring   Cough: CXR as above. Came in with aphasia and has history of dysphagia but cleared by SLP.  No shortness of breath or chest pain.  Remains on room air.  Improved. -Continue antibiotics as above -Encourage incentive spirometry -Continue mucolytic's and antitussive.   Generalized weakness: Multifactorial.  Improving but still weak. -Therapy recommended SNF..  Moderate malnutrition Body mass index is 21.1 kg/m. Nutrition Problem: Moderate Malnutrition Etiology: chronic illness, cancer and cancer related treatments Signs/Symptoms: mild fat depletion, mild muscle depletion Interventions: Ensure Enlive (each supplement provides 350kcal and 20 grams of protein), MVI, Magic cup   DVT prophylaxis:    Code Status: Full code Family Communication: None at bedside today. Level of care: Med-Surg Status is: Inpatient Remains inpatient appropriate because: Worsening pancytopenia/neutropenia, febrile illness and physical deconditioning   Final disposition: SNF once cleared by oncology Consultants:  Oncology, radiation oncology and neuro-oncology  35 minutes with more than 50% spent in reviewing records, counseling patient/family and coordinating care.   Sch Meds:  Scheduled Meds:  sodium chloride   Intravenous Once   antiseptic oral rinse  15 mL Mouth Rinse Q6H   benzonatate  100 mg Oral Q4H   Chlorhexidine Gluconate Cloth  6 each Topical Daily   famciclovir  500 mg Oral Daily   famotidine  20 mg Oral Daily   feeding supplement  237 mL Oral TID BM   filgrastim  480 mcg Subcutaneous q1800   fluconazole  100 mg Oral Daily   furosemide  20 mg Intravenous  Once   guaiFENesin  600 mg Oral BID   multivitamin with minerals  1 tablet Oral Daily   naphazoline-glycerin  2 drop Both Eyes Q8H   phosphorus  250 mg Oral TID   sodium bicarbonate/sodium chloride   Mouth Rinse Q4H   sodium chloride flush  10-40 mL Intracatheter Q12H   sodium chloride  1 g Oral TID WC   Continuous Infusions:  ceFEPime (MAXIPIME) IV 2 g (04/04/22 0941)   PRN Meds:.acetaminophen **OR** [DISCONTINUED] acetaminophen, guaiFENesin-dextromethorphan, Hot Pack, polyethylene glycol, prochlorperazine, senna-docusate, sodium chloride flush, traMADol  Antimicrobials: Anti-infectives (From admission, onward)    Start     Dose/Rate Route Frequency Ordered Stop   03/29/22 0800  ceFEPIme (MAXIPIME) 2 g in sodium chloride 0.9 % 100 mL IVPB        2 g 200 mL/hr over 30 Minutes Intravenous Every 8 hours 03/29/22 0727     03/27/22 1000  fluconazole (DIFLUCAN) tablet 100 mg        100 mg Oral Daily 03/27/22 0701     03/25/22 1000  fluconazole (DIFLUCAN) tablet 200 mg  Status:  Discontinued        200 mg Oral Daily 03/25/22 0649 03/27/22 0701   03/25/22 1000  famciclovir (FAMVIR) tablet 500 mg        500 mg Oral Daily 03/25/22 0649     03/23/22 1000  cephALEXin (KEFLEX) capsule 500 mg  500 mg Oral Every 12 hours 03/22/22 1056 03/27/22 2116   03/22/22 0800  ciprofloxacin (CIPRO) tablet 500 mg  Status:  Discontinued        500 mg Oral 2 times daily 03/22/22 0635 03/22/22 0703   03/22/22 0800  cefTRIAXone (ROCEPHIN) 1 g in sodium chloride 0.9 % 100 mL IVPB  Status:  Discontinued        1 g 200 mL/hr over 30 Minutes Intravenous Every 24 hours 03/22/22 0703 03/22/22 1056        I have personally reviewed the following labs and images: CBC: Recent Labs  Lab 03/31/22 0315 04/01/22 0450 04/02/22 0300 04/03/22 0511 04/04/22 0035  WBC 1.7* 1.4* 0.7* 0.8* 1.3*  NEUTROABS 1.3* 1.1* 0.6* 0.6* 1.0*  HGB 9.5* 9.2* 9.2* 8.8* 8.5*  HCT 26.8* 27.2* 26.7* 25.9* 24.9*  MCV 92.7 94.4  94.0 94.5 94.3  PLT 33* 24* 21* 19* 18*   BMP &GFR Recent Labs  Lab 03/30/22 0500 03/31/22 0315 04/01/22 0450 04/02/22 0300 04/03/22 0511 04/04/22 0035  NA 126* 130* 129* 132*  --  128*  K 3.8 3.7 3.3* 3.6  --  3.5  CL 97* 96* 99 99  --  96*  CO2 20* '23 22 23  '$ --  22  GLUCOSE 119* 93 114* 145*  --  129*  BUN '18 16 17 19  '$ --  17  CREATININE 0.65 0.49 0.59 0.58  --  0.54  CALCIUM 8.6* 8.6* 8.5* 8.6*  --  8.4*  MG 1.9 1.9 2.0 1.9 2.0 1.9  PHOS 2.3* 2.6 2.7 1.9* 2.2* 2.3*   Estimated Creatinine Clearance: 65.8 mL/min (by C-G formula based on SCr of 0.54 mg/dL). Liver & Pancreas: Recent Labs  Lab 03/29/22 0500 04/02/22 0300 04/04/22 0035  AST 32 48* 45*  ALT 62* 86* 83*  ALKPHOS 62 60 60  BILITOT 0.4 0.4 0.7  PROT 6.5 5.7* 5.9*  ALBUMIN 2.9* 2.4* 2.2*   No results for input(s): "LIPASE", "AMYLASE" in the last 168 hours. No results for input(s): "AMMONIA" in the last 168 hours. Diabetic: No results for input(s): "HGBA1C" in the last 72 hours. No results for input(s): "GLUCAP" in the last 168 hours.  Cardiac Enzymes: Recent Labs  Lab 04/03/22 0511  CKTOTAL 14*   No results for input(s): "PROBNP" in the last 8760 hours. Coagulation Profile: No results for input(s): "INR", "PROTIME" in the last 168 hours. Thyroid Function Tests: No results for input(s): "TSH", "T4TOTAL", "FREET4", "T3FREE", "THYROIDAB" in the last 72 hours. Lipid Profile: No results for input(s): "CHOL", "HDL", "LDLCALC", "TRIG", "CHOLHDL", "LDLDIRECT" in the last 72 hours. Anemia Panel: No results for input(s): "VITAMINB12", "FOLATE", "FERRITIN", "TIBC", "IRON", "RETICCTPCT" in the last 72 hours. Urine analysis:    Component Value Date/Time   COLORURINE YELLOW 03/29/2022 0500   APPEARANCEUR CLOUDY (A) 03/29/2022 0500   LABSPEC 1.020 03/29/2022 0500   PHURINE 6.0 03/29/2022 0500   GLUCOSEU NEGATIVE 03/29/2022 0500   GLUCOSEU NEGATIVE 11/30/2020 1218   HGBUR TRACE (A) 03/29/2022 0500    BILIRUBINUR NEGATIVE 03/29/2022 0500   BILIRUBINUR neg 06/15/2020 1333   KETONESUR NEGATIVE 03/29/2022 0500   PROTEINUR 30 (A) 03/29/2022 0500   UROBILINOGEN 0.2 11/30/2020 1218   NITRITE NEGATIVE 03/29/2022 0500   LEUKOCYTESUR TRACE (A) 03/29/2022 0500   Sepsis Labs: Invalid input(s): "PROCALCITONIN", "LACTICIDVEN"  Microbiology: Recent Results (from the past 240 hour(s))  Culture, blood (Routine X 2) w Reflex to ID Panel     Status: None   Collection Time:  03/29/22  8:46 AM   Specimen: BLOOD  Result Value Ref Range Status   Specimen Description   Final    BLOOD CENTRAL LINE BLOOD RIGHT ARM Performed at [facility], [place] [address], [place], [place] [address]    Special Requests   Final    BOTTLES DRAWN AEROBIC AND ANAEROBIC Blood Culture adequate volume Performed at [facility] [address] [place], [place] [address]    Culture   Final    NO GROWTH 5 DAYS Performed at [facility], [place] [address] [place], [place] [address]    Report Status 04/03/2022 FINAL  Final  Culture, blood (Routine X 2) w Reflex to ID Panel     Status: None   Collection Time: 03/29/22  8:52 AM   Specimen: BLOOD  Result Value Ref Range Status   Specimen Description   Final    BLOOD Line BLOOD LEFT ARM Performed at [facility] [address], [place], [place] [address]    Special Requests   Final    BOTTLES DRAWN AEROBIC AND ANAEROBIC Blood Culture adequate volume Performed at [facility] [address] [place], [place] [address]    Culture   Final    NO GROWTH 5 DAYS Performed at [facility], [place] [address], [place], [place] [address]    Report Status 04/03/2022 FINAL  Final    Radiology Studies: No results found.    Sheyanne Munley T. [place]  If 7PM-7AM, please contact night-coverage [email or web address] 04/04/2022, 10:51 AM

## 2022-04-04 NOTE — Progress Notes (Signed)
Patient son [place] called and updated that patient was transferred to [id number].

## 2022-04-04 NOTE — Progress Notes (Signed)
The white cell count started to come up nicely.  Today, the white cell count is 1300.  I think this should be the last time that she gets Neupogen.  Her mucositis is pretty much resolved.  She is able to swallow better.  Her platelet count is dropping slowly.  Hemoglobin is dropping slowly.  I do think we have to give her 1 unit of blood.  I talked to her about this.  She is in agreement.  Her platelet count hopefully will come up once the Neupogen is stopped.  She is off the Lovenox for her DVT prophylaxis.  She did physical therapy.  She is incredibly motivated.  Hopefully, she will do well at SNF.  She is still on antibiotics.  She is not coughing nearly as much.  Hopefully, with her immune system coming back, the pneumonia is improving.  Her electrolytes show sodium 128.  Potassium 3.5.  Chloride 96.  BUN 17 creatinine 0.54.  Calcium is 8.4 with an albumin of 2.2.  We really have to work on getting her appetite better.  Hopefully, she will continue to eat a little bit more.  Her LFTs are a little bit elevated but stable.  She has had no bowel or bladder incontinence.  There has been no diarrhea.  There has been no nausea or vomiting.  She has had no bony pain.  On her physical exam, temperature is 98.5.  Pulse 75.  Blood pressure 113/55.  Her head neck exam shows no residual mucositis in the buccal mucosa.  She has no adenopathy in the neck.  Lungs are clear bilaterally.  Cardiac exam regular rate and rhythm.  Abdomen is soft.  Bowel sounds are present.  There is no fluid wave.  Extremity shows no clubbing, cyanosis or edema.  Neurological exam is nonfocal.  Ms. Galeno is coming out of the neutropenia.  She is doing well with this.  Hopefully, we will be able to stop some of her medications.  Clinically, the pneumonia is improving.  I am unsure the chest x-ray will get better anytime soon.  I would think that she should be able to go to SNF by the weekend.  We will give 1 unit of blood  today.  I think this will help a little bit.  We will give Neupogen today and likely DC this.  I do appreciate the great care she is getting from everybody upon 6 E.  [name], MD  Romans 5:3-5

## 2022-04-04 NOTE — Progress Notes (Signed)
PT Cancellation Note  Patient Details Name: Ariel [name] MRN: [id number] DOB: Nov 24, 1955   Cancelled Treatment:    Reason Eval/Treat Not Completed: Other (comment) Pt receiving PRBC and declined PT at this time due to fatigue.  Will f/u as able. [place] [phone]   Karlton Lemon 04/04/2022, 4:20 PM

## 2022-04-04 NOTE — TOC Initial Note (Signed)
Transition of Care [facility]) - Initial/Assessment Note    Patient Details  Name: Ariel [name] MRN: [id number] Date of Birth: [birth year]  Transition of Care [facility]) CM/SW Contact:    Angelita Ingles, RN Phone [phone]  04/04/2022, 3:58 PM  Clinical Narrative:                 CM attempted to reach daughter to discuss disposition planning for when patient is medically stable. There is no answer. Voicemail left.   Expected Discharge Plan: Skilled Nursing Facility Barriers to Discharge: Continued Medical Work up   Patient Goals and CMS Choice Patient states their goals for this hospitalization and ongoing recovery are:: Wants to go to rehab for assistance with care CMS Medicare.gov Compare Post Acute Care list provided to:: Other (Comment Required) (daughter at bedside) Choice offered to / list presented to : Adult Children      Expected Discharge Plan and Services In-house Referral: NA Discharge Planning Services: CM Consult Post Acute Care Choice: [place] Living arrangements for the past 2 months: Single Family Home                 DME Arranged: N/A DME Agency: NA       HH Arranged: NA [place] Agency: NA        Prior Living Arrangements/Services Living arrangements for the past 2 months: [place] with:: Self Patient language and need for interpreter reviewed:: Yes Do you feel safe going back to the place where you live?: No (feels like she is too weak and can not take care of herself)   too weak unable to care for self  Need for Family Participation in Patient Care: Yes (Comment) Care giver support system in place?: Yes (comment)   Criminal Activity/Legal Involvement Pertinent to Current Situation/Hospitalization: No - Comment as needed  Activities of Daily Living Home Assistive Devices/Equipment: Cane (specify quad or straight) ADL Screening (condition at time of admission) Patient's cognitive ability adequate to safely complete daily  activities?: Yes Is the patient deaf or have difficulty hearing?: No Does the patient have difficulty seeing, even when wearing glasses/contacts?: No Does the patient have difficulty concentrating, remembering, or making decisions?: No Patient able to express need for assistance with ADLs?: Yes Does the patient have difficulty dressing or bathing?: Yes Independently performs ADLs?: Yes (appropriate for developmental age) Does the patient have difficulty walking or climbing stairs?: Yes Weakness of Legs: Both Weakness of Arms/Hands: None  Permission Sought/Granted Permission sought to share information with : Family Supports Permission granted to share information with : No  Share Information with NAME: [place] [phone]     Permission granted to share info w Relationship: daughter  Permission granted to share info w Contact Information: [phone]  Emotional Assessment Appearance:: Appears stated age Attitude/Demeanor/Rapport: Gracious Affect (typically observed): Happy, Pleasant Orientation: : Oriented to Self, Oriented to Place, Oriented to  Time Alcohol / Substance Use: Not Applicable Psych Involvement: No (comment)  Admission diagnosis:  Aphasia [R47.01] CNS lymphoma ([place]) [C85.89] Stroke-like symptoms [R29.90] Patient Active Problem List   Diagnosis Date Noted   Malnutrition of moderate degree 03/28/2022   Palliative care by specialist 03/23/2022   Aphasia 03/17/2022   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   Primary localized osteoarthritis of right hip 01/10/2022   Primary localized osteoarthritis of left hip 10/11/2021   Preoperative clearance 09/13/2021   Encounter for screening mammogram for malignant neoplasm of breast 09/13/2021   Pancytopenia ([place]) 05/16/2021   Lymphoma malignant, 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  [place]) 05/02/2021   Diffuse large B cell lymphoma ([place]) 03/07/2021   High grade B-cell lymphoma ([place]) 12/22/2020   Goals of care, counseling/discussion 12/22/2020    Malignant neoplasm metastatic to brain ([place]) 12/09/2020   Brain tumor ([place]) 12/08/2020   Hyponatremia 12/08/2020   Night sweat 44/04/4740   Complication of anesthesia 11/22/2020   PONV (postoperative nausea and vomiting) 11/22/2020   Hyperglycemia 11/10/2020   TIA (transient ischemic attack) 10/07/2020   Iron deficiency anemia 10/07/2020   Melanoma ([place]) 2021   Multinodular goiter 12/22/2013   Subclinical hyperthyroidism 12/19/2013   Urinary incontinence 11/19/2013   Hyperlipidemia 11/19/2012   Routine general medical examination at a health care facility 11/18/2012   PCP:  [name], NP Pharmacy:   [facility], [place] - [address] [place] [address] Phone: [phone] Fax: [phone]     Social Determinants of Health (SDOH) Social History: [place]: No Food Insecurity (03/18/2022)  Housing: Low Risk  (03/18/2022)  Transportation Needs: No Transportation Needs (03/18/2022)  Utilities: Not At Risk (03/18/2022)  Alcohol Screen: Low Risk  (07/08/2020)  Depression (PHQ2-9): Low Risk  (07/13/2021)  Financial Resource Strain: Low Risk  (07/13/2021)  Physical Activity: Insufficiently Active (07/13/2021)  Social Connections: Socially Isolated (07/13/2021)  Stress: No Stress Concern Present (07/13/2021)  Tobacco Use: Low Risk  (03/17/2022)   SDOH Interventions:     Readmission Risk Interventions    03/27/2022    3:00 PM 08/02/2021    3:11 PM 07/05/2021    2:17 PM  Readmission Risk Prevention Plan  Transportation Screening Complete Complete Complete  Medication Review Press photographer) Complete Complete Complete  PCP or Specialist appointment within 3-5 days of discharge Complete Complete Complete  HRI or Home Care Consult Complete Complete Complete  SW Recovery Care/Counseling Consult Complete Complete Complete  Palliative Care Screening Complete Not Applicable Not Applicable  [place]  Complete Not Applicable Not Applicable

## 2022-04-05 ENCOUNTER — Encounter: Payer: Self-pay | Admitting: *Deleted

## 2022-04-05 ENCOUNTER — Inpatient Hospital Stay (HOSPITAL_COMMUNITY): Payer: Medicare Other

## 2022-04-05 DIAGNOSIS — E440 Moderate protein-calorie malnutrition: Secondary | ICD-10-CM | POA: Diagnosis not present

## 2022-04-05 DIAGNOSIS — R058 Other specified cough: Secondary | ICD-10-CM | POA: Diagnosis not present

## 2022-04-05 DIAGNOSIS — R509 Fever, unspecified: Secondary | ICD-10-CM

## 2022-04-05 DIAGNOSIS — U071 COVID-19: Secondary | ICD-10-CM | POA: Diagnosis not present

## 2022-04-05 DIAGNOSIS — E876 Hypokalemia: Secondary | ICD-10-CM | POA: Diagnosis present

## 2022-04-05 DIAGNOSIS — C8339 Diffuse large B-cell lymphoma, extranodal and solid organ sites: Secondary | ICD-10-CM | POA: Diagnosis not present

## 2022-04-05 DIAGNOSIS — C8589 Other specified types of non-Hodgkin lymphoma, extranodal and solid organ sites: Secondary | ICD-10-CM | POA: Diagnosis not present

## 2022-04-05 DIAGNOSIS — E222 Syndrome of inappropriate secretion of antidiuretic hormone: Secondary | ICD-10-CM | POA: Diagnosis present

## 2022-04-05 DIAGNOSIS — D61818 Other pancytopenia: Secondary | ICD-10-CM | POA: Diagnosis not present

## 2022-04-05 LAB — PHOSPHORUS: Phosphorus: 3.3 mg/dL (ref 2.5–4.6)

## 2022-04-05 LAB — TYPE AND SCREEN
ABO/RH(D): A POS
Antibody Screen: NEGATIVE
Unit division: 0

## 2022-04-05 LAB — COMPREHENSIVE METABOLIC PANEL
ALT: 100 U/L — ABNORMAL HIGH (ref 0–44)
AST: 58 U/L — ABNORMAL HIGH (ref 15–41)
Albumin: 2.4 g/dL — ABNORMAL LOW (ref 3.5–5.0)
Alkaline Phosphatase: 71 U/L (ref 38–126)
Anion gap: 13 (ref 5–15)
BUN: 16 mg/dL (ref 8–23)
CO2: 21 mmol/L — ABNORMAL LOW (ref 22–32)
Calcium: 8.6 mg/dL — ABNORMAL LOW (ref 8.9–10.3)
Chloride: 96 mmol/L — ABNORMAL LOW (ref 98–111)
Creatinine, Ser: 0.64 mg/dL (ref 0.44–1.00)
GFR, Estimated: >60 mL/min (ref >60)
Glucose, Bld: 113 mg/dL — ABNORMAL HIGH (ref 70–99)
Potassium: 4.2 mmol/L (ref 3.5–5.1)
Sodium: 130 mmol/L — ABNORMAL LOW (ref 135–145)
Total Bilirubin: 0.4 mg/dL (ref 0.3–1.2)
Total Protein: 6.5 g/dL (ref 6.5–8.1)

## 2022-04-05 LAB — BPAM RBC
Blood Product Expiration Date: 202402282359
ISSUE DATE / TIME: 202402061232
Unit Type and Rh: 6200

## 2022-04-05 LAB — CBC WITH DIFFERENTIAL/PLATELET
Abs Immature Granulocytes: 0.29 10*3/uL — ABNORMAL HIGH (ref 0.00–0.07)
Basophils Absolute: 0.1 10*3/uL (ref 0.0–0.1)
Basophils Relative: 1 %
Eosinophils Absolute: 0 10*3/uL (ref 0.0–0.5)
Eosinophils Relative: 0 %
HCT: 31.4 % — ABNORMAL LOW (ref 36.0–46.0)
Hemoglobin: 10.6 g/dL — ABNORMAL LOW (ref 12.0–15.0)
Immature Granulocytes: 5 %
Lymphocytes Relative: 15 %
Lymphs Abs: 0.8 10*3/uL (ref 0.7–4.0)
MCH: 31 pg (ref 26.0–34.0)
MCHC: 33.8 g/dL (ref 30.0–36.0)
MCV: 91.8 fL (ref 80.0–100.0)
Monocytes Absolute: 0.2 10*3/uL (ref 0.1–1.0)
Monocytes Relative: 4 %
Neutro Abs: 4.1 10*3/uL (ref 1.7–7.7)
Neutrophils Relative %: 75 %
Platelets: 21 10*3/uL — CL (ref 150–400)
RBC: 3.42 MIL/uL — ABNORMAL LOW (ref 3.87–5.11)
RDW: 14 % (ref 11.5–15.5)
WBC: 5.5 10*3/uL (ref 4.0–10.5)
nRBC: 0 % (ref 0.0–0.2)

## 2022-04-05 LAB — RESPIRATORY PANEL BY PCR

## 2022-04-05 LAB — C-REACTIVE PROTEIN: CRP: 18.7 mg/dL — ABNORMAL HIGH (ref <1.0)

## 2022-04-05 LAB — AMMONIA: Ammonia: 19 umol/L (ref 9–35)

## 2022-04-05 LAB — IRON AND TIBC
Iron: 40 ug/dL (ref 28–170)
Saturation Ratios: 34 % — ABNORMAL HIGH (ref 10.4–31.8)
TIBC: 118 ug/dL — ABNORMAL LOW (ref 250–450)
UIBC: 78 ug/dL

## 2022-04-05 LAB — VITAMIN B12: Vitamin B-12: 695 pg/mL (ref 180–914)

## 2022-04-05 LAB — BLOOD GAS, VENOUS
Acid-Base Excess: 1.7 mmol/L (ref 0.0–2.0)
Bicarbonate: 24.4 mmol/L (ref 20.0–28.0)
O2 Saturation: 76.5 %
Patient temperature: 37
pCO2, Ven: 32 mmHg — ABNORMAL LOW (ref 44–60)
pH, Ven: 7.49 — ABNORMAL HIGH (ref 7.25–7.43)
pO2, Ven: 42 mmHg (ref 32–45)

## 2022-04-05 LAB — MAGNESIUM: Magnesium: 2 mg/dL (ref 1.7–2.4)

## 2022-04-05 LAB — TSH: TSH: 0.709 u[IU]/mL (ref 0.350–4.500)

## 2022-04-05 LAB — FOLATE: Folate: 31.6 ng/mL (ref >5.9)

## 2022-04-05 MED ORDER — ZINC SULFATE 220 (50 ZN) MG PO CAPS
220.0000 mg | ORAL_CAPSULE | Freq: Every day | ORAL | Status: DC
  Administered 2022-04-05 – 2022-04-14 (×9): 220 mg via ORAL
  Filled 2022-04-05 (×9): qty 1

## 2022-04-05 MED ORDER — ALBUTEROL SULFATE HFA 108 (90 BASE) MCG/ACT IN AERS
2.0000 | INHALATION_SPRAY | Freq: Four times a day (QID) | RESPIRATORY_TRACT | Status: DC
  Filled 2022-04-05: qty 6.7

## 2022-04-05 MED ORDER — VITAMIN C 500 MG PO TABS
500.0000 mg | ORAL_TABLET | Freq: Every day | ORAL | Status: DC
  Administered 2022-04-05 – 2022-04-14 (×10): 500 mg via ORAL
  Filled 2022-04-05 (×9): qty 1

## 2022-04-05 MED ORDER — LACTATED RINGERS IV SOLN: INTRAVENOUS | Status: AC

## 2022-04-05 MED ORDER — GUAIFENESIN-DM 100-10 MG/5ML PO SYRP: 10.0000 mL | ORAL_SOLUTION | ORAL | Status: DC | PRN

## 2022-04-05 MED ORDER — NIRMATRELVIR/RITONAVIR (PAXLOVID)TABLET
3.0000 | ORAL_TABLET | Freq: Two times a day (BID) | ORAL | Status: AC
  Administered 2022-04-05 – 2022-04-10 (×10): 3 via ORAL
  Filled 2022-04-05: qty 30

## 2022-04-05 NOTE — Progress Notes (Signed)
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  CT head revealed increased edema in the area of left frontal lobe lesion with no acute hemorrhage or evidence of CVA.  MRI of brain showed progression of lymphoma in the left frontal operculum.  EEG without epileptiform discharge or seizure.  Oncology, radiation oncology and neuro-oncology involved.  Patient was started on chemotherapy on 1/23 and completed first cycle.    Hospital course with new onset leukopenia with lymphopenia and mild neutropenia and worsening anemia and thrombocytopenia likely from chemotherapy she started on 1/23.  She also had intermittent fever raising concern for neutropenic fever.  CXR raises concern for pneumonia.  Urine culture on 1/22 with E. coli for which she completed antibiotic course.  Blood culture NGTD.  Remains on IV cefepime for neutropenic fever.  Blood cultures NGTD.  Oncology following.  Later in the hospitalization patient began to spike fevers again prompting repeat infectious workup revealing recurrent COVID-19 positivity suggestive of a new COVID-19 infection.      Therapy recommended SNF due to significant physical deconditioning.   Assessment and Plan: COVID-19 virus infection Patient found to be COVID-19 positive on 2/6 suspected to be a new infection since the patient was COVID-negative 1/19 Bilateral lower lobe infiltrates persisting on repeat chest x-ray today. Considering patient's extremely high risk of progressive infection placing patient on Paxlovid Airborne and contact isolation Zinc and vitamin C supplementation As needed antitussives and bronchodilator therapy  CNS lymphoma  (HCC) Neurologic symptoms such as aphasia and unilateral weakness suffered several weeks ago seem to have improved Course of Decadron complete  Pancytopenia ([place]) All cell lines improving Supportive care Monitoring with serial CBCs  Protein-calorie malnutrition, moderate ([place]) Nutrition consulted and following, their input is appreciated Providing patient with Ensure supplements 3 times daily in between meals Providing patient with zinc and vitamin C   SIADH (syndrome of inappropriate ADH production) ([place]) While typically patient will be managed with fluid restriction, patient appears volume depleted today and will be provided with a short course of intravenous fluids Monitoring sodium levels with serial chemistries  Hypokalemia Replaced  Hypophosphatemia Replaced    Subjective:  Patient complaining of continued severe generalized weakness.  Patient additionally complaining of cough, nonproductive.  Physical Exam:  Vitals:   04/04/22 1804 04/04/22 1955 04/05/22 0443 04/05/22 1150  BP: (!) 116/58 (!) 115/57 126/64 (!) 103/54  Pulse: 78 84 93 77  Resp: '19 18 17 17  '$ Temp: 98.2 F (36.8 C) 98.3 F (36.8 C) 100.3 F (37.9 C) 97.9 F (36.6 C)  TempSrc: Oral Oral Oral Oral  SpO2: 96% 97% 95% 95%  Weight:      Height:        Constitutional: Awake alert and oriented x3, no associated distress.   Skin: no rashes, no lesions, poor skin turgor noted. Eyes: Pupils are equally reactive to light.  No evidence of scleral icterus or conjunctival pallor.  ENMT: Dry mucous membranes noted.  Posterior pharynx clear of any exudate or lesions.   Respiratory: Bibasilar rales noted, with no evidence of wheezing.  Normal respiratory effort. No accessory muscle use.  Cardiovascular: Regular rate and rhythm, no murmurs / rubs / gallops. No extremity edema. 2+  pedal pulses. No carotid bruits.  Abdomen: Abdomen is soft and nontender.  No evidence of intra-abdominal masses.  Positive bowel  sounds noted in all quadrants.   Musculoskeletal: No joint deformity upper and lower extremities. Good ROM, no contractures.  Poor muscle tone noted.   Data Reviewed:  I have personally reviewed and interpreted labs, imaging.  Significant findings are   CBC: Recent Labs  Lab 04/01/22 0450 04/02/22 0300 04/03/22 0511 04/04/22 0035 04/05/22 0426  WBC 1.4* 0.7* 0.8* 1.3* 5.5  NEUTROABS 1.1* 0.6* 0.6* 1.0* 4.1  HGB 9.2* 9.2* 8.8* 8.5* 10.6*  HCT 27.2* 26.7* 25.9* 24.9* 31.4*  MCV 94.4 94.0 94.5 94.3 91.8  PLT 24* 21* 19* 18* 21*   Basic Metabolic Panel: Recent Labs  Lab 03/31/22 0315 04/01/22 0450 04/02/22 0300 04/03/22 0511 04/04/22 0035 04/05/22 0426  NA 130* 129* 132*  --  128* 130*  K 3.7 3.3* 3.6  --  3.5 4.2  CL 96* 99 99  --  96* 96*  CO2 '23 22 23  '$ --  22 21*  GLUCOSE 93 114* 145*  --  129* 113*  BUN '16 17 19  '$ --  17 16  CREATININE 0.49 0.59 0.58  --  0.54 0.64  CALCIUM 8.6* 8.5* 8.6*  --  8.4* 8.6*  MG 1.9 2.0 1.9 2.0 1.9 2.0  PHOS 2.6 2.7 1.9* 2.2* 2.3* 3.3   GFR: Estimated Creatinine Clearance: 65.8 mL/min (by C-G formula based on SCr of 0.64 mg/dL). Liver Function Tests: Recent Labs  Lab 04/02/22 0300 04/04/22 0035 04/05/22 0426  AST 48* 45* 58*  ALT 86* 83* 100*  ALKPHOS 60 60 71  BILITOT 0.4 0.7 0.4  PROT 5.7* 5.9* 6.5  ALBUMIN 2.4* 2.2* 2.4*      Code Status:  Full code.    Severity of Illness:  The appropriate patient status for this patient is INPATIENT. Inpatient status is judged to be reasonable and necessary in order to provide the required intensity of service to ensure the patient's safety. The patient's presenting symptoms, physical exam findings, and initial radiographic and laboratory data in the context of their chronic comorbidities is felt to place them at high risk for further clinical deterioration. Furthermore, it is not anticipated that the patient will be medically stable for discharge from the hospital within 2 midnights  of admission.   * I certify that at the point of admission it is my clinical judgment that the patient will require inpatient hospital care spanning beyond 2 midnights from the point of admission due to high intensity of service, high risk for further deterioration and high frequency of surveillance required.*  Time spent:  54 minutes  Author:  Vernelle Emerald MD  04/05/2022 8:05 PM

## 2022-04-05 NOTE — Assessment & Plan Note (Addendum)
White blood cell count continuing to downtrend. Oncology recommending 2 unit packed red blood cell transfusion 2/12 due to some degree of hypoxia with exertion Platelet count has remained stable status post 1 pack of platelet transfusion administered the evening of 2/9 due to epistaxis. Monitoring with serial CBCs Dr. Myna Hidalgo with oncology following daily, his input is appreciated.

## 2022-04-05 NOTE — Progress Notes (Signed)
Nutrition Follow-up  DOCUMENTATION CODES:   Non-severe (moderate) malnutrition in context of chronic illness  INTERVENTION:   -Ensure Plus High Protein po TID, each supplement provides 350 kcal and 20 grams of protein.   -Multivitamin with minerals daily   -Magic cup BID with meals, each supplement provides 290 kcal and 9 grams of protein    NUTRITION DIAGNOSIS:   Moderate Malnutrition related to chronic illness, cancer and cancer related treatments as evidenced by mild fat depletion, mild muscle depletion.  Ongoing.  GOAL:   Patient will meet greater than or equal to 90% of their needs  Progressing.  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  CT of the head revealed increased edema in the area for left frontal lobe lesion with no acute hemorrhage or evidence of CVA.  MRI of the brain showed progression of lymphoma in the left frontal operculum.  Patient has been consuming 40-50% of meals. Now found to be COVID-19 positive. Will continue current interventions.  Admission weight: 152 lbs Last weight 1/29: 134 lbs  Medications: Pepcid, Multivitamin with minerals daily, K-Phos  Labs reviewed: Low Na COVID-19+  Diet Order:   Diet Order             Diet regular Room service appropriate? Yes; Fluid consistency: Thin  Diet effective now                   EDUCATION NEEDS:   No education needs have been identified at this time  Skin:  Skin Assessment: Reviewed RN Assessment  Last BM:  2/6  Height:   Ht Readings from Last 1 Encounters:  03/17/22 '5\' 7"'$  (1.702 m)    Weight:   Wt Readings from Last 1 Encounters:  03/27/22 61.1 kg    BMI:  Body mass index is 21.1  kg/m.  Estimated Nutritional Needs:   Kcal:  2050-2250  Protein:  100-110g  Fluid:  2.2L/day   Clayton Bibles, MS, RD, LDN Inpatient Clinical Dietitian Contact information available via Amion

## 2022-04-05 NOTE — Assessment & Plan Note (Signed)
Neurologic symptoms such as aphasia and unilateral weakness suffered several weeks ago seem to have improved Course of Decadron complete

## 2022-04-05 NOTE — TOC Progression Note (Addendum)
Transition of Care [facility]) - Progression Note    Patient Details  Name: Ariel [name] MRN: [id number] Date of Birth: 05-Sep-1955  Transition [facility]) CM/SW [place], LCSW Phone Number: 04/05/2022, 10:15 AM  Clinical Narrative:    [place] to determine outcome of assessment completed on 2/5. They request that CSW follow up with daughter to discuss as she has all of the information.  CSW attempted to reach pt's daughter x 2. Voicemail left.    Update 10:30am- Received call from pt's daughter who shares she plans to follow up with [facility] today for LTC placement. She has not heard from the facility regarding assessment that took place on Monday. Pt's daughter is working on LTC placement for her mother and has no need for assistance with this.   Pt's daughter would still like pt to attend SNF prior to discharging to an ALF. Pt is currently COVID + as of 04/04/22. Pt will require a 10 day isolation period for COVID prior to being accepted to SNF. CSW will re-fax referrals for SNF closer to the end of pt's isolation period.     Expected Discharge Plan: [place] Barriers to Discharge: Continued Medical Work up  Expected Discharge Plan and Services In-house Referral: NA Discharge Planning Services: CM Consult Post Acute Care Choice: [place] Living arrangements for the past 2 months: Single Family Home                 DME Arranged: N/A DME Agency: NA       HH Arranged: NA HH Agency: NA         Social Determinants of Health (SDOH) Interventions SDOH Screenings   Food Insecurity: No Food Insecurity (03/18/2022)  Housing: Low Risk  (03/18/2022)  Transportation Needs: No Transportation Needs (03/18/2022)  Utilities: Not At Risk (03/18/2022)  Alcohol Screen: Low Risk  (07/08/2020)  Depression (PHQ2-9): Low Risk  (07/13/2021)  Financial Resource Strain: Low Risk  (07/13/2021)  Physical Activity: Insufficiently  Active (07/13/2021)  Social Connections: Socially Isolated (07/13/2021)  Stress: No Stress Concern Present (07/13/2021)  Tobacco Use: Low Risk  (03/17/2022)    Readmission Risk Interventions    03/27/2022    3:00 PM 08/02/2021    3:11 PM 07/05/2021    2:17 PM  Readmission Risk Prevention Plan  Transportation Screening Complete Complete Complete  Medication Review Press photographer) Complete Complete Complete  PCP or Specialist appointment within 3-5 days of discharge Complete Complete Complete  HRI or [place] Complete Complete Complete  SW Recovery Care/Counseling Consult Complete Complete Complete  Palliative Care Screening Complete Not Applicable Not [place] Complete Not Applicable Not Applicable

## 2022-04-05 NOTE — Hospital Course (Addendum)
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  CT head revealed increased edema in the area of left frontal lobe lesion with no acute hemorrhage or evidence of CVA.  MRI of brain showed progression of lymphoma in the left frontal operculum.  EEG without epileptiform discharge or seizure.  Oncology, radiation oncology and neuro-oncology involved.  Patient was started on chemotherapy on 1/23 and completed first cycle.    Hospital course with new onset leukopenia with lymphopenia and mild neutropenia and worsening anemia and thrombocytopenia likely from chemotherapy she started on 1/23.  She also had intermittent fever raising concern for neutropenic fever.  CXR raises concern for pneumonia.  Urine culture on 1/22 with E. coli for which she completed antibiotic course.  Blood culture NGTD.  Remains on IV cefepime for neutropenic fever.  Blood cultures NGTD.  Oncology following.  Later in the hospitalization patient began to spike fevers again prompting repeat infectious workup revealing recurrent COVID-19 positivity suggestive of a new COVID-19 infection.   Patient was treated with a 5-day course of Paxlovid.  Additional complication late in the hospital course with spontaneous epistaxis on 2/9 which was controlled with nasal packing and platelet transfusion.   Therapy recommended SNF due to significant physical deconditioning.

## 2022-04-05 NOTE — Progress Notes (Signed)
Patient continues to be hospitalized after her first treatment for lymphoma recurrence. Plan was to tentatively discharge this week to SNF, however patient tested positive for COVID yesterday.   Will continue to follow patient for post discharge needs and office follow up.   Oncology Nurse Navigator Documentation     04/05/2022    9:15 AM  Oncology Nurse Navigator Flowsheets  Navigator Follow Up Date: 04/10/2022  Navigator Follow Up Reason: Appointment Review  Navigator Location [facility]  Navigator Encounter Type Appt/Treatment Plan Review  Patient Visit Type MedOnc  Treatment Phase Active Tx  Barriers/Navigation Needs No Barriers At This Time  Interventions None Required  Acuity Level 1-No Barriers  Support Groups/Services Friends and Family  Time Spent with Patient 15

## 2022-04-05 NOTE — Progress Notes (Signed)
Unfortunately, Ariel [name] is now down on 5 E.  I am still not sure how she got COVID.  I know she had COVID back in December.  I do not think she was treated for at that time.  As such, this may have been reactivated when she got immunosuppressed.  I really would think that she needs to be on some kind of anti-COVID regimen.  She still has a little bit of a cough.  She had little bit of a low-grade temperature.  She is on Zithromax.  Her appetite is down.  She has not lost her taste for food.  There is no diarrhea.  She has had no bleeding.  She has had no nausea or vomiting.  Her labs show sodium 130.  Potassium 4.2.  BUN 16 creatinine 0.64.  Calcium 8.6 with an albumin of 2.4.  SGPT 100 SGOT 58.  Her white cell count is 5.5.  Hemoglobin 10.6.  Platelet count 21,000.  I will go ahead and stop the Neupogen.  She probably does need to have a chest x-ray.  Her vital signs are temperature 100.3.  Pulse 93.  Blood pressure 126/64.  Her head neck exam shows no mucositis.  There is no adenopathy in the neck.  Lungs are clear bilaterally.  She has good air movement bilaterally.  Cardiac exam regular rate and rhythm.  She has no murmurs.  Abdomen is soft.  Bowel sounds are present.  There is no fluid wave.  Extremity shows no clubbing, cyanosis or edema.  Neurological exam shows no focal neurological deficits.  Clearly, this COVID is going to delay her being able to go to SNF.  I am unsure what the criteria are for her to be able to go now.  Again I will have to think that she is going to need to be on some kind of anti-COVID medication.  I will have to defer to the hospitalist who is much better versed at treating COVID than I am.  Her platelet count is trending up now.  We should be able to get the Lovenox started in another day.  I just hate this for her.  I know she has a lot on her "plate."  As always, her faith has been incredibly strong.  We had a very good prayer.  We both realize that  there is a reason for this COVID infection and the fact that she moved down to 5 E.  [name], MD  2 Cor 12:9-10

## 2022-04-05 NOTE — Assessment & Plan Note (Addendum)
Oral intake seemingly improving. Nutrition consulted and following, their input is appreciated Providing patient with Ensure supplements 3 times daily in between meals Providing patient with zinc and vitamin C Encouraging oral intake

## 2022-04-05 NOTE — Assessment & Plan Note (Signed)
Replaced. °

## 2022-04-05 NOTE — Progress Notes (Signed)
Received critical lab PLT 21. Attending notified, no response at this time .

## 2022-04-05 NOTE — Assessment & Plan Note (Addendum)
Sodium level now normalized sodium chloride tablets to once daily. Off fluid restriction per patient request. Monitoring sodium levels with serial chemistries

## 2022-04-05 NOTE — Assessment & Plan Note (Addendum)
Patient found to be COVID-19 positive on 2/6 suspected to be a new infection since the patient was COVID-negative 1/19 Bilateral lower lobe infiltrates persisting on repeat chest x-ray on 2/7 Treating with Paxlovid Clinically improving Patient has additionally been on azithromycin since 2/6 however procalcitonin unremarkable, will complete 5-day course Airborne and contact isolation Zinc and vitamin C supplementation As needed antitussives and bronchodilator therapy

## 2022-04-05 NOTE — Progress Notes (Signed)
PT Cancellation Note  Patient Details Name: Ariel [name] MRN: [id number] DOB: 11-20-1955   Cancelled Treatment:    Reason Eval/Treat Not Completed: Fatigue/lethargy limiting ability to participate  [place] Office [phone] Weekend [phone]  Claretha Cooper 04/05/2022, 5:02 PM

## 2022-04-06 DIAGNOSIS — R058 Other specified cough: Secondary | ICD-10-CM | POA: Diagnosis not present

## 2022-04-06 DIAGNOSIS — D61818 Other pancytopenia: Secondary | ICD-10-CM | POA: Diagnosis not present

## 2022-04-06 DIAGNOSIS — C8339 Diffuse large B-cell lymphoma, extranodal and solid organ sites: Secondary | ICD-10-CM | POA: Diagnosis not present

## 2022-04-06 DIAGNOSIS — R509 Fever, unspecified: Secondary | ICD-10-CM | POA: Diagnosis not present

## 2022-04-06 DIAGNOSIS — U071 COVID-19: Secondary | ICD-10-CM | POA: Diagnosis not present

## 2022-04-06 DIAGNOSIS — C8589 Other specified types of non-Hodgkin lymphoma, extranodal and solid organ sites: Secondary | ICD-10-CM | POA: Diagnosis not present

## 2022-04-06 DIAGNOSIS — E440 Moderate protein-calorie malnutrition: Secondary | ICD-10-CM | POA: Diagnosis not present

## 2022-04-06 LAB — COMPREHENSIVE METABOLIC PANEL
ALT: 109 U/L — ABNORMAL HIGH (ref 0–44)
AST: 63 U/L — ABNORMAL HIGH (ref 15–41)
Albumin: 2.1 g/dL — ABNORMAL LOW (ref 3.5–5.0)
Alkaline Phosphatase: 68 U/L (ref 38–126)
Anion gap: 11 (ref 5–15)
BUN: 14 mg/dL (ref 8–23)
CO2: 24 mmol/L (ref 22–32)
Calcium: 8.4 mg/dL — ABNORMAL LOW (ref 8.9–10.3)
Chloride: 92 mmol/L — ABNORMAL LOW (ref 98–111)
Creatinine, Ser: 0.45 mg/dL (ref 0.44–1.00)
GFR, Estimated: >60 mL/min (ref >60)
Glucose, Bld: 95 mg/dL (ref 70–99)
Potassium: 3.9 mmol/L (ref 3.5–5.1)
Sodium: 127 mmol/L — ABNORMAL LOW (ref 135–145)
Total Bilirubin: 0.6 mg/dL (ref 0.3–1.2)
Total Protein: 5.5 g/dL — ABNORMAL LOW (ref 6.5–8.1)

## 2022-04-06 LAB — CBC WITH DIFFERENTIAL/PLATELET
Abs Immature Granulocytes: 0.22 10*3/uL — ABNORMAL HIGH (ref 0.00–0.07)
Basophils Absolute: 0 10*3/uL (ref 0.0–0.1)
Basophils Relative: 1 %
Eosinophils Absolute: 0 10*3/uL (ref 0.0–0.5)
Eosinophils Relative: 0 %
HCT: 27.9 % — ABNORMAL LOW (ref 36.0–46.0)
Hemoglobin: 9.4 g/dL — ABNORMAL LOW (ref 12.0–15.0)
Immature Granulocytes: 6 %
Lymphocytes Relative: 9 %
Lymphs Abs: 0.3 10*3/uL — ABNORMAL LOW (ref 0.7–4.0)
MCH: 31 pg (ref 26.0–34.0)
MCHC: 33.7 g/dL (ref 30.0–36.0)
MCV: 92.1 fL (ref 80.0–100.0)
Monocytes Absolute: 0.1 10*3/uL (ref 0.1–1.0)
Monocytes Relative: 3 %
Neutro Abs: 3.1 10*3/uL (ref 1.7–7.7)
Neutrophils Relative %: 81 %
Platelets: 21 10*3/uL — CL (ref 150–400)
RBC: 3.03 MIL/uL — ABNORMAL LOW (ref 3.87–5.11)
RDW: 13.8 % (ref 11.5–15.5)
WBC: 3.8 10*3/uL — ABNORMAL LOW (ref 4.0–10.5)
nRBC: 0 % (ref 0.0–0.2)

## 2022-04-06 LAB — PROCALCITONIN: Procalcitonin: 0.11 ng/mL

## 2022-04-06 LAB — C-REACTIVE PROTEIN: CRP: 19.7 mg/dL — ABNORMAL HIGH (ref <1.0)

## 2022-04-06 LAB — MAGNESIUM: Magnesium: 2 mg/dL (ref 1.7–2.4)

## 2022-04-06 LAB — PHOSPHORUS: Phosphorus: 2.5 mg/dL (ref 2.5–4.6)

## 2022-04-06 MED ORDER — SODIUM CHLORIDE 1 G PO TABS
1.0000 g | ORAL_TABLET | Freq: Two times a day (BID) | ORAL | Status: DC
  Administered 2022-04-06 – 2022-04-09 (×8): 1 g via ORAL
  Filled 2022-04-06 (×8): qty 1

## 2022-04-06 MED ORDER — SODIUM BICARBONATE/SODIUM CHLORIDE MOUTHWASH
OROMUCOSAL | Status: DC
  Administered 2022-04-07 – 2022-04-10 (×5): 1 via OROMUCOSAL
  Filled 2022-04-06 (×2): qty 1000

## 2022-04-06 MED ORDER — ALBUTEROL SULFATE HFA 108 (90 BASE) MCG/ACT IN AERS: 2.0000 | INHALATION_SPRAY | Freq: Four times a day (QID) | RESPIRATORY_TRACT | Status: DC | PRN

## 2022-04-06 NOTE — NC FL2 (Signed)
[place] LEVEL OF CARE FORM     IDENTIFICATION  Patient Name: Ariel [name] Birthdate: [birth year] Sex: female Admission Date (Current Location): 03/17/2022  [facility] and [place] Number:  Herbalist and Address:  [facility],  [place], [place]      Provider Number: [id number]  Attending Physician Name and Address:  Vernelle Emerald, MD  Relative Name and Phone Number:  [place] daughter [phone]    Current Level of Care: Hospital Recommended Level of Care: [place] Prior Approval Number:    Date Approved/Denied:   PASRR Number: [id number]  Discharge Plan: Other (Comment) (ALF)    Current Diagnoses: Patient Active Problem List   Diagnosis Date Noted   COVID-19 virus infection 04/05/2022   Hypokalemia 04/05/2022   Hypophosphatemia 04/05/2022   SIADH (syndrome of inappropriate ADH production) ([place]) 04/05/2022   Protein-calorie malnutrition, moderate ([place]) 03/28/2022   Palliative care by specialist 03/23/2022   Aphasia 03/17/2022   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   Primary localized osteoarthritis of right hip 01/10/2022   Primary localized osteoarthritis of left hip 10/11/2021   Preoperative clearance 09/13/2021   Encounter for screening mammogram for malignant neoplasm of breast 09/13/2021   Pancytopenia ([place]) 05/16/2021   Lymphoma malignant, 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   Diffuse large B cell lymphoma ([place]) 03/07/2021   High grade B-cell lymphoma ([place]) 12/22/2020   Goals of care, counseling/discussion 12/22/2020   Malignant neoplasm metastatic to brain ([place]) 12/09/2020   Brain tumor ([place]) 12/08/2020   Hyponatremia 12/08/2020   Night sweat [id number]   Complication of anesthesia 11/22/2020   PONV (postoperative nausea and vomiting) 11/22/2020   Hyperglycemia 11/10/2020   TIA (transient ischemic attack) 10/07/2020   Iron deficiency anemia 10/07/2020   Melanoma ([place]) 2021    Multinodular goiter 12/22/2013   Subclinical hyperthyroidism 12/19/2013   Urinary incontinence 11/19/2013   Hyperlipidemia 11/19/2012   Routine general medical examination at a health care facility 11/18/2012    Orientation RESPIRATION BLADDER Height & Weight     Self, Situation, Time, Place  Normal Continent Weight: 134 lb 11.2 oz (61.1 kg) Height:  5' 7"$  (170.2 cm)  BEHAVIORAL SYMPTOMS/MOOD NEUROLOGICAL BOWEL NUTRITION STATUS      Continent Diet (Regular)  AMBULATORY STATUS COMMUNICATION OF NEEDS Skin   Limited Assist Verbally Normal                       Personal Care Assistance Level of Assistance  Bathing, Feeding, Dressing Bathing Assistance: Limited assistance Feeding assistance: Independent Dressing Assistance: Limited assistance     Functional Limitations Info  Sight, Hearing, Speech Sight Info: Adequate Hearing Info: Adequate Speech Info: Adequate    SPECIAL CARE FACTORS FREQUENCY  PT (By licensed PT), OT (By licensed OT)     PT Frequency: 2-3x/wk OT Frequency: 2-3/wk            Contractures Contractures Info: Not present    Additional Factors Info  Code Status, Allergies, Isolation Precautions Code Status Info: FULL Allergies Info: Decadron (Dexamethasone), Plavix (Clopidogrel), Prednisone, Codeine Psychotropic Info: n/a       (see discharge summary) Insulin Sliding Scale Info: see discharge summary Isolation Precautions Info: COVID Isolation. Positive as of 04/04/22 Suctioning Needs: n/a   Current Medications (04/06/2022):  This is the current hospital active medication list Current Facility-Administered Medications  Medication Dose Route Frequency Provider Last Rate Last Admin   acetaminophen (TYLENOL) tablet 650 mg  650 mg Oral Q6H PRN  [place] Eric J, DO   650 mg at 04/04/22 1229   albuterol (VENTOLIN HFA) 108 (90 Base) MCG/ACT inhaler 2 puff  2 puff Inhalation Q6H PRN Shalhoub, Sherryll Burger, MD       antiseptic oral rinse (BIOTENE) solution 15 mL   15 mL Mouth Rinse Q6H Volanda Napoleon, MD   15 mL at 04/06/22 [id number]   ascorbic acid (VITAMIN C) tablet 500 mg  500 mg Oral Daily Shalhoub, Sherryll Burger, MD   500 mg at 04/06/22 0929   azithromycin (ZITHROMAX) 500 mg in sodium chloride 0.9 % 250 mL IVPB  500 mg Intravenous Q24H Wendee Beavers T, MD 250 mL/hr at 04/06/22 1349 500 mg at 04/06/22 1349   benzonatate (TESSALON) capsule 100 mg  100 mg Oral Q4H Volanda Napoleon, MD   100 mg at 04/06/22 1218   Chlorhexidine Gluconate Cloth 2 % PADS 6 each  6 each Topical Daily Cherene Altes, MD   6 each at 04/05/22 0946   famciclovir (FAMVIR) tablet 500 mg  500 mg Oral Daily Volanda Napoleon, MD   500 mg at 04/06/22 0929   famotidine (PEPCID) tablet 20 mg  20 mg Oral Daily Joette Catching T, MD   20 mg at 04/06/22 [id number]   feeding supplement (ENSURE ENLIVE / ENSURE PLUS) liquid 237 mL  237 mL Oral TID BM Shelly Coss, MD   237 mL at 04/05/22 2048   fluconazole (DIFLUCAN) tablet 100 mg  100 mg Oral Daily Volanda Napoleon, MD   100 mg at 04/06/22 [id number]   furosemide (LASIX) injection 20 mg  20 mg Intravenous Once Volanda Napoleon, MD       guaiFENesin-dextromethorphan (ROBITUSSIN DM) 100-10 MG/5ML syrup 10 mL  10 mL Oral Q4H PRN Shalhoub, Sherryll Burger, MD       Hot Pack 1 packet  1 packet Topical Once PRN Volanda Napoleon, MD       multivitamin with minerals tablet 1 tablet  1 tablet Oral Daily Shelly Coss, MD   1 tablet at 04/06/22 [id number]   naphazoline-glycerin (CLEAR EYES REDNESS) ophth solution 2 drop  2 drop Both Eyes Q8H Volanda Napoleon, MD   2 drop at 04/06/22 0930   nirmatrelvir/ritonavir (PAXLOVID) 3 tablet  3 tablet Oral BID Vernelle Emerald, MD   3 tablet at 04/06/22 0930   phosphorus (K PHOS NEUTRAL) tablet 250 mg  250 mg Oral TID Shelly Coss, MD   250 mg at 04/06/22 0929   polyethylene glycol (MIRALAX / GLYCOLAX) packet 17 g  17 g Oral Daily PRN Volanda Napoleon, MD       prochlorperazine (COMPAZINE) injection 10 mg  10 mg Intravenous Q6H PRN  [place] Eric J, DO       sodium bicarbonate/sodium chloride mouthwash 1052m   Mouth Rinse Q4H Shalhoub, GSherryll Burger MD   Given at 04/06/22 1218   sodium chloride flush (NS) 0.9 % injection 10-40 mL  10-40 mL Intracatheter Q12H MJoette CatchingT, MD   10 mL at 04/05/22 2048   sodium chloride flush (NS) 0.9 % injection 10-40 mL  10-40 mL Intracatheter PRN MJoette CatchingT, MD       sodium chloride tablet 1 g  1 g Oral BID WC Shalhoub, GSherryll Burger MD   1 g at 04/06/22 1218   traMADol (ULTRAM) tablet 50 mg  50 mg Oral Q6H PRN MCherene Altes MD       zinc sulfate capsule 220 mg  220 mg Oral Daily Shalhoub, Sherryll Burger, MD   220 mg at 04/06/22 [id number]   Facility-Administered Medications Ordered in Other Encounters  Medication Dose Route Frequency Provider Last Rate Last Admin   sodium chloride flush (NS) 0.9 % injection 10 mL  10 mL Intravenous PRN Volanda Napoleon, MD   10 mL at 10/04/21 0946     Discharge Medications: Please see discharge summary for a list of discharge medications.  Relevant Imaging Results:  Relevant Lab Results:   Additional Information SS# [id number]  Vassie Moselle, LCSW

## 2022-04-06 NOTE — Progress Notes (Signed)
RN walked in to introduce herself to pt. Pt asked RN to move her side table closer which she did. Pt then stated "I need my mouth wash and it is supposed to be right here!" RN started looking around room and pointed to the mouth wash next to her. She said "that is not the right type!" RN told patient that she does not see it around the room pt said "I do not care if you do not know I have to have it now!" RN asked night shift tech who confirmed not seeing any mouth wash. Since pt is on covid precautions RN called in the room to tell her that the night shift does not know where her mouth wash is. Pt stated "I do not care if no one knows where it is, someone better go get me some because I have to have it." Charge RN told pt she can have family bring her preferred mouth wash in since it is necessary. Will continue to monitor.

## 2022-04-06 NOTE — Progress Notes (Signed)
Overall, she is about the same.  She thinks her cough might be a little bit better.  The chest x-ray may show a little bit worsening infiltrate over on the left side.  She now is on Paxlovid.  She said that she has a terrible taste in her mouth.  I suspect this is from the [place].  She does not have any mucositis on exam.  Her labs show a sodium 127.  Potassium 3.9.  Chloride 92.  BUN 14 creatinine 0.45.  Calcium 8.4.  Albumin is down to 2.1.  Her LFTs are slightly elevated but stable.  Her platelets are 21,000.  White cell count 3.8.  Hemoglobin 9.4.  She is on Zithromax.  She has not had a bowel movement in a couple days.  She does not want to eat all that much because of the taste in her mouth.  She has had no obvious fever.  There is no bleeding.  She has had no rashes.  She has had no headache.  I think she is out of bed a little bit.  I know that Occupational Therapy and Physical Therapy are working with her.  Her temperature is 99.7.  Pulse 88.  Blood pressure 106/52.  Her head and neck exam shows no scleral icterus.  There is no mucositis.  There is no adenopathy in the neck.  Lungs are clear bilaterally.  She has good air movement bilaterally.  I do not hear any wheezing.  Cardiac exam regular rate and rhythm.  She has no murmurs.  Abdomen is soft.  Bowel sounds are present.  There is no fluid wave.  There is no palpable liver or spleen tip.  Neurological exam shows no focal neurological deficits.  Skin exam shows no rashes.  Ms. Feld is now dealing with COVID.  She is on Paxlovid.  Her chest x-ray might be a little bit worse.  We will just have to watch this.  Clinically, she is no worse from a respiratory point of view.  We will have to watch her LFTs.  They are up a little bit.  I just hate the fact that she has COVID.  She also will not be able to go to SNF for another couple weeks.  I do appreciate the incredible care that she is getting from everybody on 5 E.  [name], MD  Psalm 37:23

## 2022-04-06 NOTE — TOC Progression Note (Addendum)
Transition of Care [facility]) - Progression Note    Patient Details  Name: Ariel [name] MRN: [id number] Date of Birth: 1955/10/15  Transition of Care [facility]) CM/SW [place], [place] Phone Number: 04/06/2022, 11:29 AM  Clinical Narrative:    CSW spoke with pt's daughter via t/c to discuss recommendation for pt to discharge to ALF w/ home health services vs completing isolation period for SNF.  Pt's daughter made aware of reasons pt recommended for ALF w/ HH vs SNF. Pt is likely to be medically ready prior to end of isolation period and pt has not been participating with PT x 3 days. Insurance will not approve SNF placement if pt is not participating with PT.  Pt's daughter shares that [facility] has accepted pt for ALF. She has to review/sign all of the paperwork and has to complete payment prior to pt being able to transfer. Daughter is agreeable to having [place] services arranged for pt at ALF.   Update 2:45pm- Received call from pt's daughter who shares this pt will need a new FL-2 for ALF placement and will also need a QuantiFERON Gold TB test as pt will be a new resident at ALF. FL-2 complete and will fax to [facility] at [phone] once co-signed. MD notified for need for TB test.   Expected Discharge Plan: Skilled Nursing Facility Barriers to Discharge: SNF Covid  Expected Discharge Plan and Services In-house Referral: NA Discharge Planning Services: CM Consult Post Acute Care Choice: [place] Living arrangements for the past 2 months: Single Family Home                 DME Arranged: N/A DME Agency: NA       HH Arranged: NA HH Agency: NA         Social Determinants of Health (SDOH) Interventions SDOH Screenings   Food Insecurity: No Food Insecurity (03/18/2022)  Housing: Low Risk  (03/18/2022)  Transportation Needs: No Transportation Needs (03/18/2022)  Utilities: Not At Risk (03/18/2022)  Alcohol Screen: Low Risk  (07/08/2020)   Depression (PHQ2-9): Low Risk  (07/13/2021)  Financial Resource Strain: Low Risk  (07/13/2021)  Physical Activity: Insufficiently Active (07/13/2021)  Social Connections: Socially Isolated (07/13/2021)  Stress: No Stress Concern Present (07/13/2021)  Tobacco Use: Low Risk  (03/17/2022)    Readmission Risk Interventions    03/27/2022    3:00 PM 08/02/2021    3:11 PM 07/05/2021    2:17 PM  Readmission Risk Prevention Plan  Transportation Screening Complete Complete Complete  Medication Review Press photographer) Complete Complete Complete  PCP or Specialist appointment within 3-5 days of discharge Complete Complete Complete  HRI or [place] Complete Complete Complete  SW Recovery Care/Counseling Consult Complete Complete Complete  Palliative Care Screening Complete Not Applicable Not Applicable  Skilled Nursing Facility Complete Not Applicable Not Applicable

## 2022-04-06 NOTE — Progress Notes (Signed)
PROGRESS NOTE   Ariel [name]  HBZ:[id number] DOB: 05/30/1955 DOA: 03/17/2022 PCP: [name], NP   Date of Service: the patient was seen and examined on 04/06/2022  Brief Narrative:  67 year old F with PMH of diffuse large cell NHL with CNS involvement, skin melanoma and HLD presenting with acute onset right-sided facial droop, dysarthria and aphasia.  CT head revealed increased edema in the area of left frontal lobe lesion with no acute hemorrhage or evidence of CVA.  MRI of brain showed progression of lymphoma in the left frontal operculum.  EEG without epileptiform discharge or seizure.  Oncology, radiation oncology and neuro-oncology involved.  Patient was started on chemotherapy on 1/23 and completed first cycle.    Hospital course with new onset leukopenia with lymphopenia and mild neutropenia and worsening anemia and thrombocytopenia likely from chemotherapy she started on 1/23.  She also had intermittent fever raising concern for neutropenic fever.  CXR raises concern for pneumonia.  Urine culture on 1/22 with E. coli for which she completed antibiotic course.  Blood culture NGTD.  Remains on IV cefepime for neutropenic fever.  Blood cultures NGTD.  Oncology following.  Later in the hospitalization patient began to spike fevers again prompting repeat infectious workup revealing recurrent COVID-19 positivity suggestive of a new COVID-19 infection.      Therapy recommended SNF due to significant physical deconditioning.   Assessment and Plan: * COVID-19 virus infection Patient found to be COVID-19 positive on 2/6 suspected to be a new infection since the patient was COVID-negative 1/19 Bilateral lower lobe infiltrates persisting on repeat chest x-ray today. Considering patient's extremely high risk of progressive infection placing patient on Paxlovid Patient has additionally been on azithromycin since 2/6 however procalcitonin unremarkable Airborne and contact isolation Zinc and  vitamin C supplementation As needed antitussives and bronchodilator therapy  CNS lymphoma (HCC) Neurologic symptoms such as aphasia and unilateral weakness suffered several weeks ago seem to have improved Course of Decadron complete  Pancytopenia ([place]) Count stable Supportive care Monitoring with serial CBCs  Protein-calorie malnutrition, moderate ([place]) Nutrition consulted and following, their input is appreciated Providing patient with Ensure supplements 3 times daily in between meals Providing patient with zinc and vitamin C   SIADH (syndrome of inappropriate ADH production) ([place]) Sodium level slightly decreased today Initiating fluid restriction at 1200 cc daily Initiating sodium chloride tablets twice daily Monitoring sodium levels with serial chemistries  Hypokalemia Replaced  Hypophosphatemia Replaced    Subjective:  Patient reports her strength is improving.  Patient reports that her associated poor appetite is also improving.  Physical Exam:  Vitals:   04/05/22 1150 04/05/22 2054 04/06/22 0535 04/06/22 1442  BP: (!) 103/54 (!) 101/53 (!) 106/52 (!) 98/56  Pulse: 77 73 88 87  Resp: '17 17 16 18  '$ Temp: 97.9 F (36.6 C) 98 F (36.7 C) 99.7 F (37.6 C) 98.9 F (37.2 C)  TempSrc: Oral Oral Oral Oral  SpO2: 95% 96% 95% 96%  Weight:      Height:        Constitutional: Awake alert and oriented x3, no associated distress.   Skin: no rashes, no lesions, good skin turgor noted. Eyes: Pupils are equally reactive to light.  No evidence of scleral icterus or conjunctival pallor.  ENMT: Moist mucous membranes noted.  Posterior pharynx clear of any exudate or lesions.   Respiratory: Severe bibasal rales, worse in the left base.  No evidence of wheezing.  Normal respiratory effort. No accessory muscle use.  Cardiovascular: Regular rate  and rhythm, no murmurs / rubs / gallops. No extremity edema. 2+ pedal pulses. No carotid bruits.  Abdomen: Abdomen is soft and  nontender.  No evidence of intra-abdominal masses.  Positive bowel sounds noted in all quadrants.   Musculoskeletal: No joint deformity upper and lower extremities. Good ROM, no contractures. Normal muscle tone.    Data Reviewed:  I have personally reviewed and interpreted labs, imaging.  Significant findings are   CBC: Recent Labs  Lab 04/02/22 0300 04/03/22 0511 04/04/22 0035 04/05/22 0426 04/06/22 0508  WBC 0.7* 0.8* 1.3* 5.5 3.8*  NEUTROABS 0.6* 0.6* 1.0* 4.1 3.1  HGB 9.2* 8.8* 8.5* 10.6* 9.4*  HCT 26.7* 25.9* 24.9* 31.4* 27.9*  MCV 94.0 94.5 94.3 91.8 92.1  PLT 21* 19* 18* 21* 21*   Basic Metabolic Panel: Recent Labs  Lab 04/01/22 0450 04/02/22 0300 04/03/22 0511 04/04/22 0035 04/05/22 0426 04/06/22 0508  NA 129* 132*  --  128* 130* 127*  K 3.3* 3.6  --  3.5 4.2 3.9  CL 99 99  --  96* 96* 92*  CO2 22 23  --  22 21* 24  GLUCOSE 114* 145*  --  129* 113* 95  BUN 17 19  --  '17 16 14  '$ CREATININE 0.59 0.58  --  0.54 0.64 0.45  CALCIUM 8.5* 8.6*  --  8.4* 8.6* 8.4*  MG 2.0 1.9 2.0 1.9 2.0 2.0  PHOS 2.7 1.9* 2.2* 2.3* 3.3 2.5   GFR: Estimated Creatinine Clearance: 65.8 mL/min (by C-G formula based on SCr of 0.45 mg/dL). Liver Function Tests: Recent Labs  Lab 04/02/22 0300 04/04/22 0035 04/05/22 0426 04/06/22 0508  AST 48* 45* 58* 63*  ALT 86* 83* 100* 109*  ALKPHOS 60 60 71 68  BILITOT 0.4 0.7 0.4 0.6  PROT 5.7* 5.9* 6.5 5.5*  ALBUMIN 2.4* 2.2* 2.4* 2.1*      Code Status:  Full code.      Severity of Illness:  The appropriate patient status for this patient is INPATIENT. Inpatient status is judged to be reasonable and necessary in order to provide the required intensity of service to ensure the patient's safety. The patient's presenting symptoms, physical exam findings, and initial radiographic and laboratory data in the context of their chronic comorbidities is felt to place them at high risk for further clinical deterioration. Furthermore, it is  not anticipated that the patient will be medically stable for discharge from the hospital within 2 midnights of admission.   * I certify that at the point of admission it is my clinical judgment that the patient will require inpatient hospital care spanning beyond 2 midnights from the point of admission due to high intensity of service, high risk for further deterioration and high frequency of surveillance required.*  Time spent:  36 minutes  Author:  Vernelle Emerald MD  04/06/2022 8:22 PM

## 2022-04-06 NOTE — Progress Notes (Signed)
PT Cancellation Note  Patient Details Name: Ariel [name] MRN: [id number] DOB: Apr 27, 1955   Cancelled Treatment:    Reason Eval/Treat Not Completed: Patient declined, no reason specified, states  that she is too weak and has Covid again, encouraged mobility to no avail.  [place] Office [phone] Weekend [phone]    Claretha Cooper 04/06/2022, 10:44 AM

## 2022-04-06 NOTE — Plan of Care (Signed)
  Problem: Education: Goal: Knowledge of General Education information will improve Description: Including pain rating scale, medication(s)/side effects and non-pharmacologic comfort measures Outcome: Progressing   Problem: Health Behavior/Discharge Planning: Goal: Ability to manage health-related needs will improve Outcome: Progressing   Problem: Clinical Measurements: Goal: Ability to maintain clinical measurements within normal limits will improve Outcome: Progressing Goal: Will remain free from infection Outcome: Progressing Goal: Diagnostic test results will improve Outcome: Progressing Goal: Respiratory complications will improve Outcome: Progressing Goal: Cardiovascular complication will be avoided Outcome: Progressing   Problem: Activity: Goal: Risk for activity intolerance will decrease Outcome: Progressing   Problem: Nutrition: Goal: Adequate nutrition will be maintained Outcome: Progressing   Problem: Coping: Goal: Level of anxiety will decrease Outcome: Progressing   Problem: Elimination: Goal: Will not experience complications related to bowel motility Outcome: Progressing Goal: Will not experience complications related to urinary retention Outcome: Progressing   Problem: Pain Managment: Goal: General experience of comfort will improve Outcome: Progressing   Problem: Safety: Goal: Ability to remain free from injury will improve Outcome: Progressing   Problem: Skin Integrity: Goal: Risk for impaired skin integrity will decrease Outcome: Progressing   Problem: Education: Goal: Knowledge of disease or condition will improve Outcome: Progressing Goal: Knowledge of secondary prevention will improve (MUST DOCUMENT ALL) Outcome: Progressing Goal: Knowledge of patient specific risk factors will improve [place] N/A or DELETE if not current risk factor) Outcome: Progressing   Problem: Ischemic Stroke/TIA Tissue Perfusion: Goal: Complications of ischemic  stroke/TIA will be minimized Outcome: Progressing   Problem: Coping: Goal: Will verbalize positive feelings about self Outcome: Progressing Goal: Will identify appropriate support needs Outcome: Progressing   Problem: Health Behavior/Discharge Planning: Goal: Ability to manage health-related needs will improve Outcome: Progressing Goal: Goals will be collaboratively established with patient/family Outcome: Progressing   Problem: Self-Care: Goal: Ability to participate in self-care as condition permits will improve Outcome: Progressing Goal: Verbalization of feelings and concerns over difficulty with self-care will improve Outcome: Progressing Goal: Ability to communicate needs accurately will improve Outcome: Progressing   Problem: Nutrition: Goal: Risk of aspiration will decrease Outcome: Progressing Goal: Dietary intake will improve Outcome: Progressing   Problem: Education: Goal: Knowledge of risk factors and measures for prevention of condition will improve Outcome: Progressing   Problem: Coping: Goal: Psychosocial and spiritual needs will be supported Outcome: Progressing   Problem: Respiratory: Goal: Will maintain a patent airway Outcome: Progressing Goal: Complications related to the disease process, condition or treatment will be avoided or minimized Outcome: Progressing

## 2022-04-07 DIAGNOSIS — E440 Moderate protein-calorie malnutrition: Secondary | ICD-10-CM | POA: Diagnosis not present

## 2022-04-07 DIAGNOSIS — R058 Other specified cough: Secondary | ICD-10-CM | POA: Diagnosis not present

## 2022-04-07 DIAGNOSIS — C8339 Diffuse large B-cell lymphoma, extranodal and solid organ sites: Secondary | ICD-10-CM | POA: Diagnosis not present

## 2022-04-07 DIAGNOSIS — U071 COVID-19: Secondary | ICD-10-CM | POA: Diagnosis not present

## 2022-04-07 DIAGNOSIS — C8589 Other specified types of non-Hodgkin lymphoma, extranodal and solid organ sites: Secondary | ICD-10-CM | POA: Diagnosis not present

## 2022-04-07 DIAGNOSIS — D61818 Other pancytopenia: Secondary | ICD-10-CM | POA: Diagnosis not present

## 2022-04-07 DIAGNOSIS — R509 Fever, unspecified: Secondary | ICD-10-CM | POA: Diagnosis not present

## 2022-04-07 LAB — COMPREHENSIVE METABOLIC PANEL
ALT: 115 U/L — ABNORMAL HIGH (ref 0–44)
AST: 61 U/L — ABNORMAL HIGH (ref 15–41)
Albumin: 2 g/dL — ABNORMAL LOW (ref 3.5–5.0)
Alkaline Phosphatase: 63 U/L (ref 38–126)
Anion gap: 10 (ref 5–15)
BUN: 18 mg/dL (ref 8–23)
CO2: 25 mmol/L (ref 22–32)
Calcium: 8.3 mg/dL — ABNORMAL LOW (ref 8.9–10.3)
Chloride: 96 mmol/L — ABNORMAL LOW (ref 98–111)
Creatinine, Ser: 0.5 mg/dL (ref 0.44–1.00)
GFR, Estimated: >60 mL/min (ref >60)
Glucose, Bld: 122 mg/dL — ABNORMAL HIGH (ref 70–99)
Potassium: 4 mmol/L (ref 3.5–5.1)
Sodium: 131 mmol/L — ABNORMAL LOW (ref 135–145)
Total Bilirubin: 0.4 mg/dL (ref 0.3–1.2)
Total Protein: 5.5 g/dL — ABNORMAL LOW (ref 6.5–8.1)

## 2022-04-07 LAB — CBC WITH DIFFERENTIAL/PLATELET
Abs Immature Granulocytes: 0.14 10*3/uL — ABNORMAL HIGH (ref 0.00–0.07)
Basophils Absolute: 0 10*3/uL (ref 0.0–0.1)
Basophils Relative: 0 %
Eosinophils Absolute: 0 10*3/uL (ref 0.0–0.5)
Eosinophils Relative: 1 %
HCT: 26.2 % — ABNORMAL LOW (ref 36.0–46.0)
Hemoglobin: 8.8 g/dL — ABNORMAL LOW (ref 12.0–15.0)
Immature Granulocytes: 6 %
Lymphocytes Relative: 11 %
Lymphs Abs: 0.3 10*3/uL — ABNORMAL LOW (ref 0.7–4.0)
MCH: 31.2 pg (ref 26.0–34.0)
MCHC: 33.6 g/dL (ref 30.0–36.0)
MCV: 92.9 fL (ref 80.0–100.0)
Monocytes Absolute: 0.2 10*3/uL (ref 0.1–1.0)
Monocytes Relative: 8 %
Neutro Abs: 1.8 10*3/uL (ref 1.7–7.7)
Neutrophils Relative %: 74 %
Platelets: 21 10*3/uL — CL (ref 150–400)
RBC: 2.82 MIL/uL — ABNORMAL LOW (ref 3.87–5.11)
RDW: 13.6 % (ref 11.5–15.5)
WBC: 2.4 10*3/uL — ABNORMAL LOW (ref 4.0–10.5)
nRBC: 0 % (ref 0.0–0.2)

## 2022-04-07 LAB — C-REACTIVE PROTEIN: CRP: 18.1 mg/dL — ABNORMAL HIGH (ref <1.0)

## 2022-04-07 LAB — PHOSPHORUS: Phosphorus: 2.8 mg/dL (ref 2.5–4.6)

## 2022-04-07 LAB — PROCALCITONIN: Procalcitonin: <0.1 ng/mL

## 2022-04-07 LAB — MAGNESIUM: Magnesium: 2.2 mg/dL (ref 1.7–2.4)

## 2022-04-07 LAB — BRAIN NATRIURETIC PEPTIDE: B Natriuretic Peptide: 69 pg/mL (ref 0.0–100.0)

## 2022-04-07 MED ORDER — SODIUM CHLORIDE 0.9% IV SOLUTION: Freq: Once | INTRAVENOUS | Status: AC

## 2022-04-07 MED ORDER — DRONABINOL 2.5 MG PO CAPS
2.5000 mg | ORAL_CAPSULE | Freq: Two times a day (BID) | ORAL | Status: DC
  Administered 2022-04-07 – 2022-04-13 (×14): 2.5 mg via ORAL
  Filled 2022-04-07 (×14): qty 1

## 2022-04-07 NOTE — Progress Notes (Signed)
Physical Therapy Treatment Patient Details Name: Ariel [name] MRN: [id number] DOB: 18-Apr-1955 Today's Date: 04/07/2022   History of Present Illness Patient is a 67 year old female presented 03/17/22 with  right facial droop, dysarthria, aphasia. MRI showed recurrence non-Hodgkin's lymphoma in left frontal lymphoma lesion.  Pt now with COVID. PMH-CNS lymphoma, melanoma of the skin, high-grade B-cell lymphoma    PT Comments    Pt reports feeling better and appears much more alert today.  Pt with good improvement in mobility. She was able to transfer and ambulate in room with assist.  She did fatigue easily and required RW and min A to steady.  Continue to advance as able.    Recommendations for follow up therapy are one component of a multi-disciplinary discharge planning process, led by the attending physician.  Recommendations may be updated based on patient status, additional functional criteria and insurance authorization.  Follow Up Recommendations  Skilled nursing-short term rehab (<3 hours/day) (SNF vs. ALF with HHPT (pt motivated but fatigues quickly)) Can patient physically be transported by private vehicle: Yes   Assistance Recommended at Discharge Intermittent Supervision/Assistance  Patient can return home with the following Assistance with cooking/housework;Help with stairs or ramp for entrance;A little help with walking and/or transfers;A little help with bathing/dressing/bathroom   Equipment Recommendations  None recommended by PT    Recommendations for Other Services       Precautions / Restrictions Precautions Precautions: Fall     Mobility  Bed Mobility Overal bed mobility: Needs Assistance Bed Mobility: Supine to Sit, Sit to Supine     Supine to sit: Supervision Sit to supine: Supervision        Transfers Overall transfer level: Needs assistance Equipment used: Rolling walker (2 wheels) Transfers: Sit to/from Stand Sit to Stand: Min assist            General transfer comment: Performed sit to stand x 3 during session with min A to steady    Ambulation/Gait Ambulation/Gait assistance: Min assist Gait Distance (Feet): 20 Feet Assistive device: Rolling walker (2 wheels) Gait Pattern/deviations: Step-through pattern Gait velocity: decreased     General Gait Details: Min A to steady with cues for RW proximity; fatigued esaily   Stairs             Wheelchair Mobility    Modified Rankin (Stroke Patients Only)       Balance Overall balance assessment: Needs assistance Sitting-balance support: No upper extremity supported, Feet supported Sitting balance-Leahy Scale: Good     Standing balance support: No upper extremity supported, Bilateral upper extremity supported, Reliant on assistive device for balance Standing balance-Leahy Scale: Poor Standing balance comment: Tried standing without UE support but unsteady; required RW and min A at times                            Cognition Arousal/Alertness: Awake/alert Behavior During Therapy: WFL for tasks assessed/performed Overall Cognitive Status: Within Functional Limits for tasks assessed                                          Exercises General Exercises - Lower Extremity Long Arc Quad: AROM, Strengthening, Both, Seated, 10 reps Hip Flexion/Marching: AROM, Strengthening, Both, 10 reps, Seated    General Comments General comments (skin integrity, edema, etc.): VSS on RA      Pertinent Vitals/Pain Pain Assessment  Pain Assessment: No/denies pain    Home Living                          Prior Function            PT Goals (current goals can now be found in the care plan section) Progress towards PT goals: Progressing toward goals    Frequency    Min 2X/week      PT Plan Current plan remains appropriate    Co-evaluation              AM-PAC PT "6 Clicks" Mobility   Outcome Measure  Help needed turning  from your back to your side while in a flat bed without using bedrails?: A Little Help needed moving from lying on your back to sitting on the side of a flat bed without using bedrails?: A Little Help needed moving to and from a bed to a chair (including a wheelchair)?: A Little Help needed standing up from a chair using your arms (e.g., wheelchair or bedside chair)?: A Little Help needed to walk in hospital room?: A Little Help needed climbing 3-5 steps with a railing? : A Lot 6 Click Score: 17    End of Session Equipment Utilized During Treatment: Gait belt Activity Tolerance: Patient tolerated treatment well Patient left: in chair;with call bell/phone within reach Nurse Communication: Mobility status PT Visit Diagnosis: Other abnormalities of gait and mobility (R26.89);Muscle weakness (generalized) (M62.81) Pain - Right/Left: Left     Time: [id number] PT Time Calculation (min) (ACUTE ONLY): 23 min  Charges:  $Gait Training: 8-22 mins $Therapeutic Exercise: 8-22 mins                     Abran Richard, PT Acute Rehab [facility] [place] 04/07/2022, 3:41 PM

## 2022-04-07 NOTE — Progress Notes (Signed)
Ms. Serven says that she is feeling a little bit better.  She says she is trying to eat a little bit more.  I think we will likely need to get her on Marinol.  Hopefully, this will help with her appetite level more.  She continues on the Paxlovid.  She is on azithromycin.  We have her on prophylactic Diflucan and Famvir.  Surprisingly, her CBC is going down a little bit.  Her white cell count is 2.4.  Hemoglobin 8.8.  Platelet count 21,000.  I do not know if this might be from the [place] that she has.  I do not  know if might be from her medications.  We will have to watch this closely.  She is still quite weak.  She is being worked on by physical therapy and Occupational Therapy.  She has had no fever.  There has been no bleeding.  She says she is going to the bathroom okay.  There is no diarrhea.  She has had no rashes.  She has had no nausea or vomiting.  She has had no leg swelling.  Her vital signs show temperature of 98.  Pulse 77.  Blood pressure 95/57.  Her head neck exam shows no ocular or oral lesions.  There are no palpable cervical or supra clavicular lymph nodes.  Lungs are clear.  She has good air movement bilaterally.  I do not hear any crackles or wheezes.  Cardiac exam regular rate and rhythm.  Abdomen is soft.  Bowel sounds are present.  There is no fluid wave.  Extremity shows no clubbing, cyanosis or edema.  Neurological exam shows no focal neurological deficits.  Skin exam shows no rashes, ecchymosis or petechia.  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  She had a relapse in her brain.  She has had her first cycle of high-dose methotrexate based treatment.  She got through this okay but then developed COVID.  She cannot go to SNF because of COVID.  She has to wait 10 days until she can go.  Hopefully, we can get her appetite picked up with the Marinol.  I know that the staff on [address] are trying her best to help her and her so compassionate.  I know that they would  encourage her quite a bit.  I appreciate all of their help.  [name], MD  Romans 8:28

## 2022-04-07 NOTE — Plan of Care (Signed)
  Problem: Education: Goal: Knowledge of General Education information will improve Description: Including pain rating scale, medication(s)/side effects and non-pharmacologic comfort measures Outcome: Progressing   Problem: Health Behavior/Discharge Planning: Goal: Ability to manage health-related needs will improve Outcome: Progressing   Problem: Clinical Measurements: Goal: Ability to maintain clinical measurements within normal limits will improve Outcome: Progressing Goal: Will remain free from infection Outcome: Progressing Goal: Diagnostic test results will improve Outcome: Progressing Goal: Respiratory complications will improve Outcome: Progressing Goal: Cardiovascular complication will be avoided Outcome: Progressing   Problem: Activity: Goal: Risk for activity intolerance will decrease Outcome: Progressing   Problem: Nutrition: Goal: Adequate nutrition will be maintained Outcome: Progressing   Problem: Coping: Goal: Level of anxiety will decrease Outcome: Progressing   Problem: Elimination: Goal: Will not experience complications related to bowel motility Outcome: Progressing Goal: Will not experience complications related to urinary retention Outcome: Progressing   Problem: Pain Managment: Goal: General experience of comfort will improve Outcome: Progressing   Problem: Safety: Goal: Ability to remain free from injury will improve Outcome: Progressing   Problem: Skin Integrity: Goal: Risk for impaired skin integrity will decrease Outcome: Progressing   Problem: Education: Goal: Knowledge of disease or condition will improve Outcome: Progressing Goal: Knowledge of secondary prevention will improve (MUST DOCUMENT ALL) Outcome: Progressing Goal: Knowledge of patient specific risk factors will improve Ariel [name] N/A or DELETE if not current risk factor) Outcome: Progressing   Problem: Ischemic Stroke/TIA Tissue Perfusion: Goal: Complications of ischemic  stroke/TIA will be minimized Outcome: Progressing   Problem: Coping: Goal: Will verbalize positive feelings about self Outcome: Progressing Goal: Will identify appropriate support needs Outcome: Progressing   Problem: Health Behavior/Discharge Planning: Goal: Ability to manage health-related needs will improve Outcome: Progressing Goal: Goals will be collaboratively established with patient/family Outcome: Progressing   Problem: Self-Care: Goal: Ability to participate in self-care as condition permits will improve Outcome: Progressing Goal: Verbalization of feelings and concerns over difficulty with self-care will improve Outcome: Progressing Goal: Ability to communicate needs accurately will improve Outcome: Progressing   Problem: Nutrition: Goal: Risk of aspiration will decrease Outcome: Progressing Goal: Dietary intake will improve Outcome: Progressing   Problem: Education: Goal: Knowledge of risk factors and measures for prevention of condition will improve Outcome: Progressing   Problem: Coping: Goal: Psychosocial and spiritual needs will be supported Outcome: Progressing   Problem: Respiratory: Goal: Will maintain a patent airway Outcome: Progressing Goal: Complications related to the disease process, condition or treatment will be avoided or minimized Outcome: Progressing

## 2022-04-07 NOTE — TOC Progression Note (Signed)
Transition of Care [facility]) - Progression Note    Patient Details  Name: Ariel [name] MRN: [id number] Date of Birth: 1956-01-14  [facility]) CM/SW [place], LCSW Phone Number: 04/07/2022, 10:42 AM  Clinical Narrative:    Faxed [place]-2 to [place] for review. Pt will also require TB test and  Physicians Supplemental Order form to be complete for ALF placement.  TOC following for SNF placement vs ALF w/ HH placement.  Pt will require 10 day isolation period for SNF placement with anticipated DC date of 04/15/22. Pt will also need to participate in PT sessions in order for insurance to approve for SNF.    Expected Discharge Plan: Skilled Nursing Facility Barriers to Discharge: SNF Covid  Expected Discharge Plan and Services In-house Referral: NA Discharge Planning Services: CM Consult Post Acute Care Choice: [place] Living arrangements for the past 2 months: Single Family Home                 DME Arranged: N/A DME Agency: NA       HH Arranged: NA HH Agency: NA         Social Determinants of Health (SDOH) Interventions SDOH Screenings   Food Insecurity: No Food Insecurity (03/18/2022)  Housing: Low Risk  (03/18/2022)  Transportation Needs: No Transportation Needs (03/18/2022)  Utilities: Not At Risk (03/18/2022)  Alcohol Screen: Low Risk  (07/08/2020)  Depression (PHQ2-9): Low Risk  (07/13/2021)  Financial Resource Strain: Low Risk  (07/13/2021)  Physical Activity: Insufficiently Active (07/13/2021)  Social Connections: Socially Isolated (07/13/2021)  Stress: No Stress Concern Present (07/13/2021)  Tobacco Use: Low Risk  (03/17/2022)    Readmission Risk Interventions    03/27/2022    3:00 PM 08/02/2021    3:11 PM 07/05/2021    2:17 PM  Readmission Risk Prevention Plan  Transportation Screening Complete Complete Complete  Medication Review Press photographer) Complete Complete Complete  PCP or Specialist appointment within 3-5  days of discharge Complete Complete Complete  HRI or [place] Complete Complete Complete  SW Recovery Care/Counseling Consult Complete Complete Complete  Palliative Care Screening Complete Not Applicable Not Applicable  Skilled Nursing Facility Complete Not Applicable Not Applicable

## 2022-04-07 NOTE — Progress Notes (Signed)
PROGRESS NOTE   Ariel [name]  [id number] DOB: [birth year] DOA: 03/17/2022 PCP: [name], NP   Date of Service: the patient was seen and examined on 04/07/2022  Brief Narrative:  67 year old F with PMH of diffuse large cell NHL with CNS involvement, skin melanoma and HLD presenting with acute onset right-sided facial droop, dysarthria and aphasia.  CT head revealed increased edema in the area of left frontal lobe lesion with no acute hemorrhage or evidence of CVA.  MRI of brain showed progression of lymphoma in the left frontal operculum.  EEG without epileptiform discharge or seizure.  Oncology, radiation oncology and neuro-oncology involved.  Patient was started on chemotherapy on 1/23 and completed first cycle.    Hospital course with new onset leukopenia with lymphopenia and mild neutropenia and worsening anemia and thrombocytopenia likely from chemotherapy she started on 1/23.  She also had intermittent fever raising concern for neutropenic fever.  CXR raises concern for pneumonia.  Urine culture on 1/22 with E. coli for which she completed antibiotic course.  Blood culture NGTD.  Remains on IV cefepime for neutropenic fever.  Blood cultures NGTD.  Oncology following.  Later in the hospitalization patient began to spike fevers again prompting repeat infectious workup revealing recurrent COVID-19 positivity suggestive of a new COVID-19 infection.      Therapy recommended SNF due to significant physical deconditioning.   Assessment and Plan: * COVID-19 virus infection Patient found to be COVID-19 positive on 2/6 suspected to be a new infection since the patient was COVID-negative 1/19 Bilateral lower lobe infiltrates persisting on repeat chest x-ray on 2/7 Treating with Paxlovid Clinically improving Patient has additionally been on azithromycin since 2/6 however procalcitonin unremarkable, will complete 5-day course Airborne and contact isolation Zinc and vitamin C  supplementation As needed antitussives and bronchodilator therapy  CNS lymphoma ([place]) Neurologic symptoms such as aphasia and unilateral weakness suffered several weeks ago seem to have improved Course of Decadron complete  Pancytopenia ([place]) White blood cell count slightly downtrending.  Hemoglobin and platelet count essentially stable. Supportive care Monitoring with serial CBCs  Protein-calorie malnutrition, moderate ([place]) Nutrition consulted and following, their input is appreciated Providing patient with Ensure supplements 3 times daily in between meals Providing patient with zinc and vitamin C   SIADH (syndrome of inappropriate ADH production) ([place]) Sodium level improving Patient very upset about fluid restriction, will discontinue this for now since sodium levels are improved Continue sodium chloride tablets twice daily Monitoring sodium levels with serial chemistries  Hypokalemia Replaced  Hypophosphatemia Replaced        Subjective:  Patient reports her generalized weakness is continuing to improve.  Patient denies any shortness of breath or cough.  Physical Exam:  Vitals:   04/06/22 1442 04/06/22 2210 04/07/22 0448 04/07/22 1221  BP: (!) 98/56 (!) 91/56 (!) 95/57 101/61  Pulse: 87 79 77 83  Resp: 18 18 18 16  $ Temp: 98.9 F (37.2 C) 98 F (36.7 C) 98 F (36.7 C) 97.9 F (36.6 C)  TempSrc: Oral Oral Oral Oral  SpO2: 96% 95% 95% 96%  Weight:      Height:         Constitutional: Awake alert and oriented x3, no associated distress.   Skin: no rashes, no lesions, good skin turgor noted. Eyes: Pupils are equally reactive to light.  No evidence of scleral icterus or conjunctival pallor.  ENMT: Moist mucous membranes noted.  Posterior pharynx clear of any exudate or lesions.   Respiratory: Bibasilar rales slowly improving.  No evidence of wheezing.  No evidence of accessory muscle use or respiratory distress.   Cardiovascular: Regular rate and rhythm,  no murmurs / rubs / gallops. No extremity edema. 2+ pedal pulses. No carotid bruits.  Abdomen: Abdomen is soft and nontender.  No evidence of intra-abdominal masses.  Positive bowel sounds noted in all quadrants.   Musculoskeletal: No joint deformity upper and lower extremities. Good ROM, no contractures. Normal muscle tone.    Data Reviewed:  I have personally reviewed and interpreted labs, imaging.  Significant findings are   CBC: Recent Labs  Lab 04/03/22 0511 04/04/22 0035 04/05/22 0426 04/06/22 0508 04/07/22 0535  WBC 0.8* 1.3* 5.5 3.8* 2.4*  NEUTROABS 0.6* 1.0* 4.1 3.1 1.8  HGB 8.8* 8.5* 10.6* 9.4* 8.8*  HCT 25.9* 24.9* 31.4* 27.9* 26.2*  MCV 94.5 94.3 91.8 92.1 92.9  PLT 19* 18* 21* 21* 21*   Basic Metabolic Panel: Recent Labs  Lab 04/02/22 0300 04/03/22 0511 04/04/22 0035 04/05/22 0426 04/06/22 0508 04/07/22 0535  NA 132*  --  128* 130* 127* 131*  K 3.6  --  3.5 4.2 3.9 4.0  CL 99  --  96* 96* 92* 96*  CO2 23  --  22 21* 24 25  GLUCOSE 145*  --  129* 113* 95 122*  BUN 19  --  17 16 14 18  $ CREATININE 0.58  --  0.54 0.64 0.45 0.50  CALCIUM 8.6*  --  8.4* 8.6* 8.4* 8.3*  MG 1.9 2.0 1.9 2.0 2.0 2.2  PHOS 1.9* 2.2* 2.3* 3.3 2.5 2.8   GFR: Estimated Creatinine Clearance: 65.8 mL/min (by C-G formula based on SCr of 0.5 mg/dL). Liver Function Tests: Recent Labs  Lab 04/02/22 0300 04/04/22 0035 04/05/22 0426 04/06/22 0508 04/07/22 0535  AST 48* 45* 58* 63* 61*  ALT 86* 83* 100* 109* 115*  ALKPHOS 60 60 71 68 63  BILITOT 0.4 0.7 0.4 0.6 0.4  PROT 5.7* 5.9* 6.5 5.5* 5.5*  ALBUMIN 2.4* 2.2* 2.4* 2.1* 2.0*    Code Status:  Full code.    Severity of Illness:  The appropriate patient status for this patient is INPATIENT. Inpatient status is judged to be reasonable and necessary in order to provide the required intensity of service to ensure the patient's safety. The patient's presenting symptoms, physical exam findings, and initial radiographic and  laboratory data in the context of their chronic comorbidities is felt to place them at high risk for further clinical deterioration. Furthermore, it is not anticipated that the patient will be medically stable for discharge from the hospital within 2 midnights of admission.   * I certify that at the point of admission it is my clinical judgment that the patient will require inpatient hospital care spanning beyond 2 midnights from the point of admission due to high intensity of service, high risk for further deterioration and high frequency of surveillance required.*  Time spent:  36 minutes  Author:  Vernelle Emerald MD  04/07/2022 9:26 PM

## 2022-04-08 DIAGNOSIS — C8589 Other specified types of non-Hodgkin lymphoma, extranodal and solid organ sites: Secondary | ICD-10-CM | POA: Diagnosis not present

## 2022-04-08 DIAGNOSIS — R040 Epistaxis: Secondary | ICD-10-CM | POA: Diagnosis not present

## 2022-04-08 DIAGNOSIS — D61818 Other pancytopenia: Secondary | ICD-10-CM | POA: Diagnosis not present

## 2022-04-08 DIAGNOSIS — R058 Other specified cough: Secondary | ICD-10-CM | POA: Diagnosis not present

## 2022-04-08 DIAGNOSIS — U071 COVID-19: Secondary | ICD-10-CM | POA: Diagnosis not present

## 2022-04-08 DIAGNOSIS — R509 Fever, unspecified: Secondary | ICD-10-CM | POA: Diagnosis not present

## 2022-04-08 DIAGNOSIS — C8339 Diffuse large B-cell lymphoma, extranodal and solid organ sites: Secondary | ICD-10-CM | POA: Diagnosis not present

## 2022-04-08 LAB — CBC WITH DIFFERENTIAL/PLATELET
Abs Immature Granulocytes: 0.09 10*3/uL — ABNORMAL HIGH (ref 0.00–0.07)
Basophils Absolute: 0 10*3/uL (ref 0.0–0.1)
Basophils Relative: 1 %
Eosinophils Absolute: 0 10*3/uL (ref 0.0–0.5)
Eosinophils Relative: 2 %
HCT: 25.6 % — ABNORMAL LOW (ref 36.0–46.0)
Hemoglobin: 8.5 g/dL — ABNORMAL LOW (ref 12.0–15.0)
Immature Granulocytes: 5 %
Lymphocytes Relative: 18 %
Lymphs Abs: 0.4 10*3/uL — ABNORMAL LOW (ref 0.7–4.0)
MCH: 31.3 pg (ref 26.0–34.0)
MCHC: 33.2 g/dL (ref 30.0–36.0)
MCV: 94.1 fL (ref 80.0–100.0)
Monocytes Absolute: 0.1 10*3/uL (ref 0.1–1.0)
Monocytes Relative: 7 %
Neutro Abs: 1.3 10*3/uL — ABNORMAL LOW (ref 1.7–7.7)
Neutrophils Relative %: 67 %
Platelets: 41 10*3/uL — ABNORMAL LOW (ref 150–400)
RBC: 2.72 MIL/uL — ABNORMAL LOW (ref 3.87–5.11)
RDW: 13.3 % (ref 11.5–15.5)
WBC: 2 10*3/uL — ABNORMAL LOW (ref 4.0–10.5)
nRBC: 0 % (ref 0.0–0.2)

## 2022-04-08 LAB — COMPREHENSIVE METABOLIC PANEL
ALT: 95 U/L — ABNORMAL HIGH (ref 0–44)
AST: 44 U/L — ABNORMAL HIGH (ref 15–41)
Albumin: 2.2 g/dL — ABNORMAL LOW (ref 3.5–5.0)
Alkaline Phosphatase: 72 U/L (ref 38–126)
Anion gap: 9 (ref 5–15)
BUN: 16 mg/dL (ref 8–23)
CO2: 26 mmol/L (ref 22–32)
Calcium: 8.5 mg/dL — ABNORMAL LOW (ref 8.9–10.3)
Chloride: 98 mmol/L (ref 98–111)
Creatinine, Ser: 0.48 mg/dL (ref 0.44–1.00)
GFR, Estimated: >60 mL/min (ref >60)
Glucose, Bld: 104 mg/dL — ABNORMAL HIGH (ref 70–99)
Potassium: 3.9 mmol/L (ref 3.5–5.1)
Sodium: 133 mmol/L — ABNORMAL LOW (ref 135–145)
Total Bilirubin: 0.4 mg/dL (ref 0.3–1.2)
Total Protein: 5.7 g/dL — ABNORMAL LOW (ref 6.5–8.1)

## 2022-04-08 LAB — BPAM PLATELET PHERESIS
Blood Product Expiration Date: 202402112359
ISSUE DATE / TIME: 202402092212
Unit Type and Rh: 6200

## 2022-04-08 LAB — PREPARE PLATELET PHERESIS: Unit division: 0

## 2022-04-08 LAB — MAGNESIUM: Magnesium: 2.1 mg/dL (ref 1.7–2.4)

## 2022-04-08 LAB — PHOSPHORUS: Phosphorus: 3.5 mg/dL (ref 2.5–4.6)

## 2022-04-08 NOTE — Progress Notes (Signed)
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  CT head revealed increased edema in the area of left frontal lobe lesion with no acute hemorrhage or evidence of CVA.  MRI of brain showed progression of lymphoma in the left frontal operculum.  EEG without epileptiform discharge or seizure.  Oncology, radiation oncology and neuro-oncology involved.  Patient was started on chemotherapy on 1/23 and completed first cycle.    Hospital course with new onset leukopenia with lymphopenia and mild neutropenia and worsening anemia and thrombocytopenia likely from chemotherapy she started on 1/23.  She also had intermittent fever raising concern for neutropenic fever.  CXR raises concern for pneumonia.  Urine culture on 1/22 with E. coli for which she completed antibiotic course.  Blood culture NGTD.  Remains on IV cefepime for neutropenic fever.  Blood cultures NGTD.  Oncology following.  Later in the hospitalization patient began to spike fevers again prompting repeat infectious workup revealing recurrent COVID-19 positivity suggestive of a new COVID-19 infection.   Patient was treated with a 5-day course of Paxlovid.  Additional complication late in the hospital course with spontaneous epistaxis on 2/9 which was controlled with nasal packing and platelet transfusion.   Therapy recommended SNF due to significant physical deconditioning.   Assessment and Plan: * COVID-19 virus infection Patient found to be COVID-19 positive on 2/6 suspected to be a new infection since the patient was COVID-negative 1/19 Bilateral lower lobe infiltrates persisting on repeat chest x-ray on 2/7 Treating with  Paxlovid day 4 of 5 Clinically improving Patient has additionally received a 5-day course of azithromycin. Airborne and contact isolation Zinc and vitamin C supplementation As needed antitussives and bronchodilator therapy  CNS lymphoma (HCC) Neurologic symptoms such as aphasia and unilateral weakness suffered several weeks ago seem to have improved Course of Decadron complete  Pancytopenia ([place]) White blood cell count continuing to downtrend.   Hemoglobin and platelet count essentially stable. Platelet count has increased to 41 this morning status post 1 pack of platelet transfusion administered the evening of 2/9 due to epistaxis. Monitoring with serial CBCs  Epistaxis Patient exhibited some Stansel spontaneous epistaxis of the right nare evening of 2/9 This was in the setting of a platelet count of 21.  Night provider initiated 1 unit packed red blood cell transfusion as well as nasal packing resulting in hemostasis Hemoglobin this morning stable with bleeding still controlled after removal of packing. Monitoring for recurrence  Protein-calorie malnutrition, moderate ([place]) Nutrition consulted and following, their input is appreciated Providing patient with Ensure supplements 3 times daily in between meals Providing patient with zinc and vitamin C   SIADH (syndrome of inappropriate ADH production) ([place]) Sodium level continuing to improve, currently 133 Off blood restriction per patient request. Continue sodium chloride tablets twice daily Monitoring sodium levels with serial chemistries  Hypokalemia Replaced  Hypophosphatemia Replaced    Subjective:  Patient states that her nosebleed that began yesterday evening has completely stopped even after removal of her nasal packing.  This morning, patient has no complaints.  Physical Exam:  Vitals:   04/07/22 2244 04/08/22 0502 04/08/22 1210 04/08/22 2230  BP: 93/75 117/63 (!) 117/58 (!) 115/59  Pulse: 91 82 86 78  Resp: 18 16 18 16  $ Temp: 97.8 F (36.6 C) 97.7 F (36.5 C) 97.9 F (36.6 C) 97.8 F (36.6 C)  TempSrc: Oral Oral Oral Oral  SpO2: 98% 95% 96% 98%  Weight:      Height:        Constitutional: Awake alert and oriented x3, no associated distress.   Skin: no rashes, no lesions, good skin turgor noted. Eyes: Pupils are equally reactive to light.  No evidence of scleral icterus or conjunctival pallor.  ENMT: Moist mucous membranes noted.  Posterior pharynx clear of any exudate or lesions.   Respiratory: clear to auscultation bilaterally, no wheezing, no crackles. Normal respiratory effort. No accessory muscle use.  Cardiovascular: Regular rate and rhythm, no murmurs / rubs / gallops. No extremity edema. 2+ pedal pulses. No carotid bruits.  Abdomen: Abdomen is soft and nontender.  No evidence of intra-abdominal masses.  Positive bowel sounds noted in all quadrants.   Musculoskeletal: No joint deformity upper and lower extremities. Good ROM, no contractures. Normal muscle tone.    Data Reviewed:  I have personally reviewed and interpreted labs, imaging.  Significant findings are   CBC: Recent Labs  Lab 04/04/22 0035 04/05/22 0426 04/06/22 0508 04/07/22 0535 04/08/22 0434  WBC 1.3* 5.5 3.8* 2.4* 2.0*  NEUTROABS 1.0* 4.1 3.1 1.8 1.3*  HGB 8.5* 10.6* 9.4* 8.8* 8.5*  HCT 24.9* 31.4* 27.9* 26.2* 25.6*  MCV 94.3 91.8 92.1 92.9 94.1  PLT 18* 21* 21* 21* 41*   Basic Metabolic Panel: Recent Labs  Lab 04/04/22 0035 04/05/22 0426 04/06/22 0508 04/07/22 0535 04/08/22 0434  NA 128* 130* 127* 131* 133*  K 3.5 4.2 3.9 4.0 3.9  CL 96* 96* 92* 96* 98  CO2 22 21* 24 25 26  $ GLUCOSE 129* 113* 95 122* 104*  BUN 17 16 14 18 16  $ CREATININE 0.54 0.64 0.45 0.50 0.48  CALCIUM 8.4* 8.6* 8.4* 8.3* 8.5*  MG 1.9 2.0 2.0 2.2 2.1  PHOS 2.3* 3.3 2.5 2.8 3.5   GFR: Estimated Creatinine Clearance: 65.8 mL/min (by C-G formula based on SCr of 0.48 mg/dL). Liver Function Tests: Recent Labs  Lab  04/04/22 0035 04/05/22 0426 04/06/22 0508 04/07/22 0535 04/08/22 0434  AST 45* 58* 63* 61* 44*  ALT 83* 100* 109* 115* 95*  ALKPHOS 60 71 68 63 72  BILITOT 0.7 0.4 0.6 0.4 0.4  PROT 5.9* 6.5 5.5* 5.5* 5.7*  ALBUMIN 2.2* 2.4* 2.1* 2.0* 2.2*    Code Status:  Full code.   Severity of Illness:  The appropriate patient status for this patient is INPATIENT. Inpatient status is judged to be reasonable and necessary in order to provide the required intensity of service to ensure the patient's safety. The patient's presenting symptoms, physical exam findings, and initial radiographic and laboratory data in the context of their chronic comorbidities is felt to place them at high risk for further clinical deterioration. Furthermore, it is not anticipated that the patient will be medically stable for discharge from the hospital within 2 midnights of admission.   * I certify that at the point of admission it is my clinical judgment that the patient will require inpatient hospital care spanning beyond 2 midnights from the point of admission due to high intensity of service, high risk for further deterioration and high frequency of surveillance required.*  Time spent:  39 minutes  Author:  Vernelle Emerald MD  04/08/2022 11:05 PM

## 2022-04-08 NOTE — Progress Notes (Signed)
Mobility Specialist - Progress Note   04/08/22 1627  Mobility  Activity Transferred to/from [facility]  Level of Assistance Contact guard assist, steadying assist  Distance Ambulated (ft) 2 ft  Range of Motion/Exercises Active  Activity Response Tolerated well  Mobility Referral Yes  $Mobility charge 1 Mobility   Pt was found in bed and agreeable to ambulate. Stated that the bed was soiled and wanting to use the [facility]. Pt was able to stand and pivot to [facility]. NT was called in to assist in cleaning soiled bed and with pt care. Pt stated wanting to go back to bed afterwards and at EOS was left in bed with necessities in reach.  Ferd Hibbs Mobility Specialist

## 2022-04-08 NOTE — Assessment & Plan Note (Signed)
Patient exhibited some spontaneous epistaxis of the right nare evening of 2/9 This was in the setting of a platelet count of 21.  Night provider initiated 1 unit packed red blood cell transfusion as well as nasal packing resulting in hemostasis No further recurrence

## 2022-04-08 NOTE — Progress Notes (Signed)
Apparently, she was had a nosebleed yesterday.  I do not see anything in the chart documented about this.  She says she had a bleed from the left nares.  She says she coughed up a large blood clot.  Looks like she did get a unit of platelets.  Her platelet count was 41,000.  She said that she was up more yesterday.  She said that she felt she ate better yesterday.  She has had no fever.  She is still on Paxlovid for the COVID.  She says her cough is better.  I think that she is off azithromycin.  Her CBC is still on the lower side.  Her white cell count is 2.  Hemoglobin 8.5.  Platelet count 41,000.  Her ANC is 1.3.  She has had no problems with pain.  She is going to the bathroom.  There is no diarrhea.  Her vital signs show temperature of 97.7.  Pulse 82.  Blood pressure 117/63.  Her head neck exam shows no ocular or oral lesions.  There is no adenopathy in the neck.  Her lungs are clear bilaterally.  Cardiac exam shows a regular rate and rhythm.  There are no murmurs.  Abdomen is soft.  Bowel sounds are present.  She has no fluid wave.  There is no palpable liver or spleen tip.  Skin exam shows no rashes, ecchymosis or petechia.  Neurological exam is nonfocal.  I hate that she had this nosebleed.  Hopefully, this is just isolated.  I will think blood pressure has been a problem for her.  This may be that the hospital air is on the dry side.  We will have to watch her blood counts closely.  I do not want to start Neupogen on her right now.  I know that the staff on 5 [address]. are doing a good job with her.  I do appreciate all their efforts.  [name], MD  1 Cor 13:13

## 2022-04-09 ENCOUNTER — Inpatient Hospital Stay (HOSPITAL_COMMUNITY): Payer: Medicare Other

## 2022-04-09 DIAGNOSIS — C8589 Other specified types of non-Hodgkin lymphoma, extranodal and solid organ sites: Secondary | ICD-10-CM | POA: Diagnosis not present

## 2022-04-09 DIAGNOSIS — U071 COVID-19: Secondary | ICD-10-CM | POA: Diagnosis not present

## 2022-04-09 DIAGNOSIS — D61818 Other pancytopenia: Secondary | ICD-10-CM | POA: Diagnosis not present

## 2022-04-09 DIAGNOSIS — R040 Epistaxis: Secondary | ICD-10-CM | POA: Diagnosis not present

## 2022-04-09 LAB — COMPREHENSIVE METABOLIC PANEL
ALT: 71 U/L — ABNORMAL HIGH (ref 0–44)
AST: 32 U/L (ref 15–41)
Albumin: 2.2 g/dL — ABNORMAL LOW (ref 3.5–5.0)
Alkaline Phosphatase: 64 U/L (ref 38–126)
Anion gap: 12 (ref 5–15)
BUN: 16 mg/dL (ref 8–23)
CO2: 28 mmol/L (ref 22–32)
Calcium: 8.7 mg/dL — ABNORMAL LOW (ref 8.9–10.3)
Chloride: 95 mmol/L — ABNORMAL LOW (ref 98–111)
Creatinine, Ser: 0.54 mg/dL (ref 0.44–1.00)
GFR, Estimated: >60 mL/min (ref >60)
Glucose, Bld: 146 mg/dL — ABNORMAL HIGH (ref 70–99)
Potassium: 4.3 mmol/L (ref 3.5–5.1)
Sodium: 135 mmol/L (ref 135–145)
Total Bilirubin: 0.4 mg/dL (ref 0.3–1.2)
Total Protein: 5.7 g/dL — ABNORMAL LOW (ref 6.5–8.1)

## 2022-04-09 LAB — APTT: aPTT: 30 seconds (ref 24–36)

## 2022-04-09 LAB — PROTIME-INR
INR: 1 (ref 0.8–1.2)
Prothrombin Time: 13.2 seconds (ref 11.4–15.2)

## 2022-04-09 LAB — CBC WITH DIFFERENTIAL/PLATELET
Abs Immature Granulocytes: 0 10*3/uL (ref 0.00–0.07)
Basophils Absolute: 0 10*3/uL (ref 0.0–0.1)
Basophils Relative: 0 %
Eosinophils Absolute: 0 10*3/uL (ref 0.0–0.5)
Eosinophils Relative: 2 %
HCT: 25.3 % — ABNORMAL LOW (ref 36.0–46.0)
Hemoglobin: 8.2 g/dL — ABNORMAL LOW (ref 12.0–15.0)
Lymphocytes Relative: 30 %
Lymphs Abs: 0.5 10*3/uL — ABNORMAL LOW (ref 0.7–4.0)
MCH: 31.5 pg (ref 26.0–34.0)
MCHC: 32.4 g/dL (ref 30.0–36.0)
MCV: 97.3 fL (ref 80.0–100.0)
Metamyelocytes Relative: 1 %
Monocytes Absolute: 0 10*3/uL — ABNORMAL LOW (ref 0.1–1.0)
Monocytes Relative: 2 %
Neutro Abs: 1 10*3/uL — ABNORMAL LOW (ref 1.7–7.7)
Neutrophils Relative %: 65 %
Platelets: 43 10*3/uL — ABNORMAL LOW (ref 150–400)
RBC: 2.6 MIL/uL — ABNORMAL LOW (ref 3.87–5.11)
RDW: 13.5 % (ref 11.5–15.5)
WBC: 1.6 10*3/uL — ABNORMAL LOW (ref 4.0–10.5)
nRBC: 0 % (ref 0.0–0.2)

## 2022-04-09 LAB — PHOSPHORUS: Phosphorus: 3.2 mg/dL (ref 2.5–4.6)

## 2022-04-09 NOTE — Progress Notes (Signed)
Mobility Specialist - Progress Note   04/09/22 1127  Oxygen Therapy  O2 Device Room Air  Mobility  Activity Ambulated with assistance in hallway  Level of Assistance Standby assist, set-up cues, supervision of patient - no hands on  Assistive Device Front wheel walker  Distance Ambulated (ft) 80 ft  Activity Response Tolerated well  Mobility Referral Yes  $Mobility charge 1 Mobility   Pt received in bed and agreeable to mobility. Upon walking in hallway pt stated she felt SOB & nauseous. Encouraged pt to return back to room. O2 & BP checked. Nurse notified of occurrences. Pt to recliner after session with all needs met.     Post-mobility: 103 HR, 119/70(86) BP, 88% SPO2  Set designer

## 2022-04-09 NOTE — Progress Notes (Signed)
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  CT head revealed increased edema in the area of left frontal lobe lesion with no acute hemorrhage or evidence of CVA.  MRI of brain showed progression of lymphoma in the left frontal operculum.  EEG without epileptiform discharge or seizure.  Oncology, radiation oncology and neuro-oncology involved.  Patient was started on chemotherapy on 1/23 and completed first cycle.    Hospital course with new onset leukopenia with lymphopenia and mild neutropenia and worsening anemia and thrombocytopenia likely from chemotherapy she started on 1/23.  She also had intermittent fever raising concern for neutropenic fever.  CXR raises concern for pneumonia.  Urine culture on 1/22 with E. coli for which she completed antibiotic course.  Blood culture NGTD.  Remains on IV cefepime for neutropenic fever.  Blood cultures NGTD.  Oncology following.  Later in the hospitalization patient began to spike fevers again prompting repeat infectious workup revealing recurrent COVID-19 positivity suggestive of a new COVID-19 infection.   Patient was treated with a 5-day course of Paxlovid.  Additional complication late in the hospital course with spontaneous epistaxis on 2/9 which was controlled with nasal packing and platelet transfusion.   Therapy recommended SNF due to significant physical deconditioning.   Assessment and Plan: * COVID-19 virus infection Patient found to be COVID-19 positive on 2/6 suspected to be a new infection since the patient was COVID-negative 1/19 Bilateral lower lobe infiltrates persisting on repeat chest x-ray on 2/7 Treating with  Paxlovid day 5 of 5 Clinically improving Patient has additionally received a 5-day course of azithromycin. Airborne and contact isolation Zinc and vitamin C supplementation As needed antitussives and bronchodilator therapy  CNS lymphoma (HCC) Neurologic symptoms such as aphasia and unilateral weakness suffered several weeks ago seem to have improved Course of Decadron complete  Pancytopenia ([place]) White blood cell count continuing to downtrend.   Hemoglobin and platelet count essentially stable. Platelet count has remained stable status post 1 pack of platelet transfusion administered the evening of 2/9 due to epistaxis. Monitoring with serial CBCs  Epistaxis Patient exhibited some Stansel spontaneous epistaxis of the right nare evening of 2/9 This was in the setting of a platelet count of 21.  Night provider initiated 1 unit packed red blood cell transfusion as well as nasal packing resulting in hemostasis No further recurrence   Protein-calorie malnutrition, moderate (HCC) Oral intake seemingly improving. Nutrition consulted and following, their input is appreciated Providing patient with Ensure supplements 3 times daily in between meals Providing patient with zinc and vitamin C Encouraging oral intake   SIADH (syndrome of inappropriate ADH production) ([place]) Sodium level continuing to improve Will reduce sodium chloride tablets to once daily. Off fluid restriction per patient request. Monitoring sodium levels with serial chemistries  Hypokalemia Replaced  Hypophosphatemia Replaced    Subjective:  Patient is no further episodes of epistaxis.  Right patient reports that her generalized weakness has essentially resolved.  Patient denies any cough or shortness of breath.  Physical Exam:  Vitals:   04/08/22 2230 04/09/22 0622 04/09/22 1203 04/09/22 1939  BP: (!) 115/59 105/61 (!) 100/56 (!) 110/54  Pulse: 78 79 86 80  Resp: 16 16 16 $ 18  Temp: 97.8 F (36.6 C) 97.8  F (36.6 C) 97.8 F (36.6 C) 98 F (36.7 C)  TempSrc: Oral Oral Oral Oral  SpO2: 98% 96% 96% 96%  Weight:      Height:        Constitutional: Awake alert and oriented x3, no associated distress.   Skin: no rashes, no lesions, good skin turgor noted. Eyes: Pupils are equally reactive to light.  No evidence of scleral icterus or conjunctival pallor.  ENMT: Moist mucous membranes noted.  Posterior pharynx clear of any exudate or lesions.   Respiratory: clear to auscultation bilaterally, no wheezing, no crackles. Normal respiratory effort. No accessory muscle use.  Cardiovascular: Regular rate and rhythm, no murmurs / rubs / gallops. No extremity edema. 2+ pedal pulses. No carotid bruits.  Abdomen: Abdomen is soft and nontender.  No evidence of intra-abdominal masses.  Positive bowel sounds noted in all quadrants.   Musculoskeletal: No joint deformity upper and lower extremities. Good ROM, no contractures. Normal muscle tone.    Data Reviewed:  I have personally reviewed and interpreted labs, imaging.  Significant findings are   CBC: Recent Labs  Lab 04/05/22 0426 04/06/22 0508 04/07/22 0535 04/08/22 0434 04/09/22 1000  WBC 5.5 3.8* 2.4* 2.0* 1.6*  NEUTROABS 4.1 3.1 1.8 1.3* 1.0*  HGB 10.6* 9.4* 8.8* 8.5* 8.2*  HCT 31.4* 27.9* 26.2* 25.6* 25.3*  MCV 91.8 92.1 92.9 94.1 97.3  PLT 21* 21* 21* 41* 43*   Basic Metabolic Panel: Recent Labs  Lab 04/04/22 0035 04/05/22 0426 04/06/22 0508 04/07/22 0535 04/08/22 0434 04/09/22 0250  NA 128* 130* 127* 131* 133* 135  K 3.5 4.2 3.9 4.0 3.9 4.3  CL 96* 96* 92* 96* 98 95*  CO2 22 21* 24 25 26 28  $ GLUCOSE 129* 113* 95 122* 104* 146*  BUN 17 16 14 18 16 16  $ CREATININE 0.54 0.64 0.45 0.50 0.48 0.54  CALCIUM 8.4* 8.6* 8.4* 8.3* 8.5* 8.7*  MG 1.9 2.0 2.0 2.2 2.1  --   PHOS 2.3* 3.3 2.5 2.8 3.5 3.2   GFR: Estimated Creatinine Clearance: 65.8 mL/min (by C-G formula based on SCr of 0.54 mg/dL). Liver Function Tests: Recent Labs   Lab 04/05/22 0426 04/06/22 0508 04/07/22 0535 04/08/22 0434 04/09/22 0250  AST 58* 63* 61* 44* 32  ALT 100* 109* 115* 95* 71*  ALKPHOS 71 68 63 72 64  BILITOT 0.4 0.6 0.4 0.4 0.4  PROT 6.5 5.5* 5.5* 5.7* 5.7*  ALBUMIN 2.4* 2.1* 2.0* 2.2* 2.2*    Code Status:  Full code.   Severity of Illness:  The appropriate patient status for this patient is INPATIENT. Inpatient status is judged to be reasonable and necessary in order to provide the required intensity of service to ensure the patient's safety. The patient's presenting symptoms, physical exam findings, and initial radiographic and laboratory data in the context of their chronic comorbidities is felt to place them at high risk for further clinical deterioration. Furthermore, it is not anticipated that the patient will be medically stable for discharge from the hospital within 2 midnights of admission.   * I certify that at the point of admission it is my clinical judgment that the patient will require inpatient hospital care spanning beyond 2 midnights from the point of admission due to high intensity of service, high risk for further deterioration and high frequency of surveillance required.*  Time spent:  35 minutes  Author:  Vernelle Emerald MD  04/10/2022 12:03 AM

## 2022-04-10 DIAGNOSIS — D61818 Other pancytopenia: Secondary | ICD-10-CM | POA: Diagnosis not present

## 2022-04-10 DIAGNOSIS — R040 Epistaxis: Secondary | ICD-10-CM | POA: Diagnosis not present

## 2022-04-10 DIAGNOSIS — U071 COVID-19: Secondary | ICD-10-CM | POA: Diagnosis not present

## 2022-04-10 DIAGNOSIS — C8589 Other specified types of non-Hodgkin lymphoma, extranodal and solid organ sites: Secondary | ICD-10-CM | POA: Diagnosis not present

## 2022-04-10 LAB — COMPREHENSIVE METABOLIC PANEL
ALT: 62 U/L — ABNORMAL HIGH (ref 0–44)
AST: 32 U/L (ref 15–41)
Albumin: 2.6 g/dL — ABNORMAL LOW (ref 3.5–5.0)
Alkaline Phosphatase: 64 U/L (ref 38–126)
Anion gap: 10 (ref 5–15)
BUN: 16 mg/dL (ref 8–23)
CO2: 29 mmol/L (ref 22–32)
Calcium: 8.8 mg/dL — ABNORMAL LOW (ref 8.9–10.3)
Chloride: 97 mmol/L — ABNORMAL LOW (ref 98–111)
Creatinine, Ser: 0.49 mg/dL (ref 0.44–1.00)
GFR, Estimated: >60 mL/min (ref >60)
Glucose, Bld: 127 mg/dL — ABNORMAL HIGH (ref 70–99)
Potassium: 4.3 mmol/L (ref 3.5–5.1)
Sodium: 136 mmol/L (ref 135–145)
Total Bilirubin: 0.3 mg/dL (ref 0.3–1.2)
Total Protein: 6.1 g/dL — ABNORMAL LOW (ref 6.5–8.1)

## 2022-04-10 LAB — MAGNESIUM: Magnesium: 2.2 mg/dL (ref 1.7–2.4)

## 2022-04-10 MED ORDER — FILGRASTIM 480 MCG/1.6ML IJ SOLN
480.0000 ug | Freq: Every day | INTRAMUSCULAR | Status: DC
  Administered 2022-04-10 – 2022-04-11 (×2): 480 ug via SUBCUTANEOUS
  Filled 2022-04-10 (×2): qty 1.6

## 2022-04-10 MED ORDER — SODIUM CHLORIDE 0.9% IV SOLUTION: Freq: Once | INTRAVENOUS | Status: AC

## 2022-04-10 MED ORDER — FUROSEMIDE 10 MG/ML IJ SOLN
20.0000 mg | Freq: Two times a day (BID) | INTRAMUSCULAR | Status: DC
  Administered 2022-04-11: 20 mg via INTRAVENOUS
  Filled 2022-04-10 (×4): qty 2

## 2022-04-10 MED ORDER — SODIUM CHLORIDE 1 G PO TABS
1.0000 g | ORAL_TABLET | Freq: Every day | ORAL | Status: DC
  Administered 2022-04-10 – 2022-04-14 (×5): 1 g via ORAL
  Filled 2022-04-10 (×5): qty 1

## 2022-04-10 NOTE — Progress Notes (Signed)
Ariel [name] said that she had a good weekend.  She did have a drop in her O2 saturation when she was ambulating.  Her hemoglobin is only 8.2.  I think she is going need to get transfused.  Her white cell count is dropping.  It is now 1.6.  They wanted to get her back on Neupogen.  I suspect that she has some marrow suppression secondary to this COVID infection that she developed.  She had a chest x-ray yesterday which did show some infiltrates which appear to be stable.  There has been no bleeding.  She has had no obvious fever.  She says that her appetite is doing a little bit better.  Her labs show sodium 136.  Potassium 4.3.  BUN 16 creatinine 0.49.  Calcium is 8.8 with an albumin of 2.6.  Her LFTs are pretty much back to normal.  Her white cell count is 1.6.  Hemoglobin 8.2.  Platelet count 43,000.  She does not complain of any pain.  There is no mouth sores.  There is no headache.  Her vital signs show temperature 98.  Pulse 80.  Blood pressure 110/54.  Oxygen saturation is 96%.  Her head and neck exam shows no mucositis.  There is no scleral icterus.  Her lungs are clear bilaterally.  She has good air movement bilaterally.  Cardiac exam regular rate and rhythm.  Abdomen is soft.  Bowel sounds are present.  There is no guarding or rebound tenderness.  Extremity shows no clubbing, cyanosis or edema.  Neurological exam is nonfocal.  Ariel [name] needs to be transfused today.  We will give her 2 units of blood.  She understands why.  She agrees to this.  I would like to hope that she can get to [facility] this week.  I really would like to see her white cell count up a little bit more.  I know this has been a lot more challenging than I would have thought.  I know having COVID certainly did not help anything.  I do appreciate the great care that she is getting from the staff on 5 E.  Of note, [birth year].  [name], MD  1 Cor 13:13

## 2022-04-10 NOTE — Progress Notes (Signed)
OT Cancellation Note  Patient Details Name: Ariel [name] MRN: [id number] DOB: 1955-11-01   Cancelled Treatment:    Reason Eval/Treat Not Completed: Patient not medically ready Patient is receiving PRBCs at this time. OT to continue to follow and check back as schedule will allow.  Rennie Plowman, [facility] Office# [phone]  04/10/2022, 12:20 PM

## 2022-04-10 NOTE — Progress Notes (Signed)
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  CT head revealed increased edema in the area of left frontal lobe lesion with no acute hemorrhage or evidence of CVA.  MRI of brain showed progression of lymphoma in the left frontal operculum.  EEG without epileptiform discharge or seizure.  Oncology, radiation oncology and neuro-oncology involved.  Patient was started on chemotherapy on 1/23 and completed first cycle.    Hospital course with new onset leukopenia with lymphopenia and mild neutropenia and worsening anemia and thrombocytopenia likely from chemotherapy she started on 1/23.  She also had intermittent fever raising concern for neutropenic fever.  CXR raises concern for pneumonia.  Urine culture on 1/22 with E. coli for which she completed antibiotic course.  Blood culture NGTD.  Remains on IV cefepime for neutropenic fever.  Blood cultures NGTD.  Oncology following.  Later in the hospitalization patient began to spike fevers again prompting repeat infectious workup revealing recurrent COVID-19 positivity suggestive of a new COVID-19 infection.   Patient was treated with a 5-day course of Paxlovid.  Additional complication late in the hospital course with spontaneous epistaxis on 2/9 which was controlled with nasal packing and platelet transfusion.   Therapy recommended SNF due to significant physical deconditioning.   Assessment and Plan: * [id number] virus infection "Patient found to be COVID-19 positive on 2/6 suspected to be a new infection since the patient was COVID-negative 1/19 Bilateral lower lobe infiltrates persisting on repeat chest x-ray on 2/7 Paxlovid  complete. Clinically improving Patient has additionally received a 5-day course of azithromycin. Airborne and contact isolation Zinc and vitamin C supplementation As needed antitussives and bronchodilator therapy  CNS lymphoma (HCC) Neurologic symptoms such as aphasia and unilateral weakness suffered several weeks ago seem to have improved Course of Decadron complete  Pancytopenia ([place]) White blood cell count continuing to downtrend. Oncology recommending 2 unit packed red blood cell transfusion today due to some degree of hypoxia with exertion Platelet count has remained stable status post 1 pack of platelet transfusion administered the evening of 2/9 due to epistaxis. Monitoring with serial CBCs  Epistaxis Patient exhibited some Stansel spontaneous epistaxis of the right nare evening of 2/9 This was in the setting of a platelet count of 21.  Night provider initiated 1 unit packed red blood cell transfusion as well as nasal packing resulting in hemostasis No further recurrence   Protein-calorie malnutrition, moderate (HCC) Nutrition consulted and following, their input is appreciated Providing patient with Ensure supplements 3 times daily in between meals Providing patient with zinc and vitamin C Encouraging oral intake   SIADH (syndrome of inappropriate ADH production) ([place]) Sodium level now normalized sodium chloride tablets to once daily. Off fluid restriction per patient request. Monitoring sodium levels with serial chemistries  Hypokalemia Replaced  Hypophosphatemia Replaced    Subjective:  Patient continues to report no further episodes of epistaxis.   Patient denies any cough or shortness of breath.  Physical Exam:  Vitals:   04/10/22 1437 04/10/22 1700 04/10/22 1827 04/10/22 1943  BP: 118/67 122/64 121/65 (!) 115/55  Pulse: 87 88 85 89  Resp: 18 16  17  $ Temp: (!) 97.5 F (36.4 C) 98.2 F (36.8 C) 98 F (  36.7 C) 97.9 F (36.6 C)  TempSrc: Oral Oral  Oral Oral  SpO2: 98% 97% 97% 96%  Weight:      Height:        Constitutional: Awake alert and oriented x3, no associated distress.   Skin: Increased skin pallor noted, no rashes, no lesions, good skin turgor noted. Eyes: Pupils are equally reactive to light.  No evidence of scleral icterus or conjunctival pallor.  ENMT: Moist mucous membranes noted.  Posterior pharynx clear of any exudate or lesions.   Respiratory: Notable bibasilar rales, no evidence of wheezing, normal respiratory effort. No accessory muscle use.  Cardiovascular: Regular rate and rhythm, no murmurs / rubs / gallops. No extremity edema. 2+ pedal pulses. No carotid bruits.  Abdomen: Abdomen is soft and nontender.  No evidence of intra-abdominal masses.  Positive bowel sounds noted in all quadrants.   Musculoskeletal: No joint deformity upper and lower extremities. Good ROM, no contractures. Normal muscle tone.    Data Reviewed:  I have personally reviewed and interpreted labs, imaging.  Significant findings are   CBC: Recent Labs  Lab 04/05/22 0426 04/06/22 0508 04/07/22 0535 04/08/22 0434 04/09/22 1000  WBC 5.5 3.8* 2.4* 2.0* 1.6*  NEUTROABS 4.1 3.1 1.8 1.3* 1.0*  HGB 10.6* 9.4* 8.8* 8.5* 8.2*  HCT 31.4* 27.9* 26.2* 25.6* 25.3*  MCV 91.8 92.1 92.9 94.1 97.3  PLT 21* 21* 21* 41* 43*   Basic Metabolic Panel: Recent Labs  Lab 04/05/22 0426 04/06/22 0508 04/07/22 0535 04/08/22 0434 04/09/22 0250 04/10/22 0306  NA 130* 127* 131* 133* 135 136  K 4.2 3.9 4.0 3.9 4.3 4.3  CL 96* 92* 96* 98 95* 97*  CO2 21* 24 25 26 28 29  $ GLUCOSE 113* 95 122* 104* 146* 127*  BUN 16 14 18 16 16 16  $ CREATININE 0.64 0.45 0.50 0.48 0.54 0.49  CALCIUM 8.6* 8.4* 8.3* 8.5* 8.7* 8.8*  MG 2.0 2.0 2.2 2.1  --  2.2  PHOS 3.3 2.5 2.8 3.5 3.2  --    GFR: Estimated Creatinine Clearance: 65.8 mL/min (by C-G formula based on SCr of 0.49 mg/dL). Liver Function Tests: Recent Labs  Lab 04/06/22 0508 04/07/22 0535 04/08/22 0434  04/09/22 0250 04/10/22 0306  AST 63* 61* 44* 32 32  ALT 109* 115* 95* 71* 62*  ALKPHOS 68 63 72 64 64  BILITOT 0.6 0.4 0.4 0.4 0.3  PROT 5.5* 5.5* 5.7* 5.7* 6.1*  ALBUMIN 2.1* 2.0* 2.2* 2.2* 2.6*    Code Status:  Full code.   Severity of Illness:  The appropriate patient status for this patient is INPATIENT. Inpatient status is judged to be reasonable and necessary in order to provide the required intensity of service to ensure the patient's safety. The patient's presenting symptoms, physical exam findings, and initial radiographic and laboratory data in the context of their chronic comorbidities is felt to place them at high risk for further clinical deterioration. Furthermore, it is not anticipated that the patient will be medically stable for discharge from the hospital within 2 midnights of admission.   * I certify that at the point of admission it is my clinical judgment that the patient will require inpatient hospital care spanning beyond 2 midnights from the point of admission due to high intensity of service, high risk for further deterioration and high frequency of surveillance required.*  Time spent:  50 minutes  Author:  Vernelle Emerald MD  04/10/2022 10:27 PM

## 2022-04-10 NOTE — Progress Notes (Signed)
Physical Therapy Treatment Patient Details Name: Ariel [name] MRN: [id number] DOB: 06/10/1955 Today's Date: 04/10/2022   History of Present Illness Patient is a 67 year old female presented 03/17/22 with  right facial droop, dysarthria, aphasia. MRI showed recurrence non-Hodgkin's lymphoma in left frontal lymphoma lesion.  Pt now with COVID. PMH-CNS lymphoma, melanoma of the skin, high-grade B-cell lymphoma    PT Comments    Pt making gradual progress but continues to fatigue very easily with less than household ambulation distance.  She was on RA with sats 91-93% during session.  Did note Hgb is 8.2 and pt to get 2 units PRBC today per notes.  Continue to progress as able.     Recommendations for follow up therapy are one component of a multi-disciplinary discharge planning process, led by the attending physician.  Recommendations may be updated based on patient status, additional functional criteria and insurance authorization.  Follow Up Recommendations  Skilled nursing-short term rehab (<3 hours/day) (SNF vs. ALF with HHPT (pt motivated but fatigues quickly)) Can patient physically be transported by private vehicle: Yes   Assistance Recommended at Discharge Intermittent Supervision/Assistance  Patient can return home with the following Assistance with cooking/housework;Help with stairs or ramp for entrance;A little help with walking and/or transfers;A little help with bathing/dressing/bathroom   Equipment Recommendations  None recommended by PT    Recommendations for Other Services       Precautions / Restrictions Precautions Precautions: Fall     Mobility  Bed Mobility   Bed Mobility: Supine to Sit     Supine to sit: Supervision          Transfers Overall transfer level: Needs assistance Equipment used: Rolling walker (2 wheels) Transfers: Sit to/from Stand Sit to Stand: Min guard           General transfer comment: Perfomed x 3     Ambulation/Gait Ambulation/Gait assistance: Min guard Gait Distance (Feet): 22 Feet (22'x2) Assistive device: Rolling walker (2 wheels) Gait Pattern/deviations: Step-through pattern Gait velocity: decreased     General Gait Details: Min G to steady with cues for RW proximity; fatigued esaily; required rest breaks   Stairs             Wheelchair Mobility    Modified Rankin (Stroke Patients Only)       Balance Overall balance assessment: Needs assistance Sitting-balance support: No upper extremity supported, Feet supported Sitting balance-Leahy Scale: Good     Standing balance support: No upper extremity supported, Bilateral upper extremity supported, Reliant on assistive device for balance Standing balance-Leahy Scale: Poor Standing balance comment: Tried standing without UE support but unsteady; required RW                            Cognition Arousal/Alertness: Awake/alert Behavior During Therapy: WFL for tasks assessed/performed Overall Cognitive Status: Within Functional Limits for tasks assessed                                          Exercises      General Comments General comments (skin integrity, edema, etc.): Pt on RA with O2 91% rest and 93% ambulation ; did note pt to receive 2 units prbc later; declined further exercise due to fatigue      Pertinent Vitals/Pain Pain Assessment Pain Assessment: No/denies pain    Home Living  Prior Function            PT Goals (current goals can now be found in the care plan section) Progress towards PT goals: Progressing toward goals    Frequency    Min 2X/week      PT Plan Current plan remains appropriate    Co-evaluation              AM-PAC PT "6 Clicks" Mobility   Outcome Measure  Help needed turning from your back to your side while in a flat bed without using bedrails?: A Little Help needed moving from lying on your  back to sitting on the side of a flat bed without using bedrails?: A Little Help needed moving to and from a bed to a chair (including a wheelchair)?: A Little Help needed standing up from a chair using your arms (e.g., wheelchair or bedside chair)?: A Little Help needed to walk in hospital room?: A Little Help needed climbing 3-5 steps with a railing? : A Lot 6 Click Score: 17    End of Session Equipment Utilized During Treatment: Gait belt Activity Tolerance: Patient tolerated treatment well Patient left: in chair;with call bell/phone within reach Nurse Communication: Mobility status PT Visit Diagnosis: Other abnormalities of gait and mobility (R26.89);Muscle weakness (generalized) (M62.81) Pain - Right/Left: Left     Time: [id number] PT Time Calculation (min) (ACUTE ONLY): 18 min  Charges:  $Gait Training: 8-22 mins                     Abran Richard, PT Acute Rehab [facility] [place] 04/10/2022, 10:42 AM

## 2022-04-11 ENCOUNTER — Encounter: Payer: Self-pay | Admitting: *Deleted

## 2022-04-11 DIAGNOSIS — U071 COVID-19: Secondary | ICD-10-CM | POA: Diagnosis not present

## 2022-04-11 DIAGNOSIS — D61818 Other pancytopenia: Secondary | ICD-10-CM | POA: Diagnosis not present

## 2022-04-11 DIAGNOSIS — R040 Epistaxis: Secondary | ICD-10-CM | POA: Diagnosis not present

## 2022-04-11 DIAGNOSIS — C8589 Other specified types of non-Hodgkin lymphoma, extranodal and solid organ sites: Secondary | ICD-10-CM | POA: Diagnosis not present

## 2022-04-11 LAB — COMPREHENSIVE METABOLIC PANEL
ALT: 61 U/L — ABNORMAL HIGH (ref 0–44)
ALT: 54 U/L — ABNORMAL HIGH (ref 0–44)
AST: 39 U/L (ref 15–41)
AST: 36 U/L (ref 15–41)
Albumin: 2.7 g/dL — ABNORMAL LOW (ref 3.5–5.0)
Albumin: 2.8 g/dL — ABNORMAL LOW (ref 3.5–5.0)
Alkaline Phosphatase: 77 U/L (ref 38–126)
Alkaline Phosphatase: 70 U/L (ref 38–126)
Anion gap: 11 (ref 5–15)
Anion gap: 13 (ref 5–15)
BUN: 17 mg/dL (ref 8–23)
BUN: 16 mg/dL (ref 8–23)
CO2: 27 mmol/L (ref 22–32)
CO2: 28 mmol/L (ref 22–32)
Calcium: 9 mg/dL (ref 8.9–10.3)
Calcium: 8.9 mg/dL (ref 8.9–10.3)
Chloride: 97 mmol/L — ABNORMAL LOW (ref 98–111)
Chloride: 95 mmol/L — ABNORMAL LOW (ref 98–111)
Creatinine, Ser: 0.64 mg/dL (ref 0.44–1.00)
Creatinine, Ser: 0.58 mg/dL (ref 0.44–1.00)
GFR, Estimated: >60 mL/min (ref >60)
GFR, Estimated: >60 mL/min (ref >60)
Glucose, Bld: 133 mg/dL — ABNORMAL HIGH (ref 70–99)
Glucose, Bld: 141 mg/dL — ABNORMAL HIGH (ref 70–99)
Potassium: 3.8 mmol/L (ref 3.5–5.1)
Potassium: 3.9 mmol/L (ref 3.5–5.1)
Sodium: 135 mmol/L (ref 135–145)
Sodium: 136 mmol/L (ref 135–145)
Total Bilirubin: 0.6 mg/dL (ref 0.3–1.2)
Total Bilirubin: 0.5 mg/dL (ref 0.3–1.2)
Total Protein: 6.4 g/dL — ABNORMAL LOW (ref 6.5–8.1)
Total Protein: 5.9 g/dL — ABNORMAL LOW (ref 6.5–8.1)

## 2022-04-11 LAB — MAGNESIUM: Magnesium: 2.2 mg/dL (ref 1.7–2.4)

## 2022-04-11 LAB — TYPE AND SCREEN
ABO/RH(D): A POS
Antibody Screen: NEGATIVE
Unit division: 0
Unit division: 0

## 2022-04-11 LAB — CBC WITH DIFFERENTIAL/PLATELET
Abs Immature Granulocytes: 0.72 10*3/uL — ABNORMAL HIGH (ref 0.00–0.07)
Basophils Absolute: 0.1 10*3/uL (ref 0.0–0.1)
Basophils Relative: 1 %
Eosinophils Absolute: 0 10*3/uL (ref 0.0–0.5)
Eosinophils Relative: 0 %
HCT: 34.8 % — ABNORMAL LOW (ref 36.0–46.0)
Hemoglobin: 11.6 g/dL — ABNORMAL LOW (ref 12.0–15.0)
Immature Granulocytes: 10 %
Lymphocytes Relative: 6 %
Lymphs Abs: 0.5 10*3/uL — ABNORMAL LOW (ref 0.7–4.0)
MCH: 30.3 pg (ref 26.0–34.0)
MCHC: 33.3 g/dL (ref 30.0–36.0)
MCV: 90.9 fL (ref 80.0–100.0)
Monocytes Absolute: 0.5 10*3/uL (ref 0.1–1.0)
Monocytes Relative: 6 %
Neutro Abs: 5.6 10*3/uL (ref 1.7–7.7)
Neutrophils Relative %: 77 %
Platelets: 38 10*3/uL — ABNORMAL LOW (ref 150–400)
RBC: 3.83 MIL/uL — ABNORMAL LOW (ref 3.87–5.11)
RDW: 16.2 % — ABNORMAL HIGH (ref 11.5–15.5)
WBC: 7.4 10*3/uL (ref 4.0–10.5)
nRBC: 0 % (ref 0.0–0.2)

## 2022-04-11 LAB — PHOSPHORUS: Phosphorus: 4 mg/dL (ref 2.5–4.6)

## 2022-04-11 LAB — BPAM RBC
Blood Product Expiration Date: 202403072359
Blood Product Expiration Date: 202403072359
ISSUE DATE / TIME: 202402121100
ISSUE DATE / TIME: 202402121407
Unit Type and Rh: 6200
Unit Type and Rh: 6200

## 2022-04-11 NOTE — Progress Notes (Signed)
Nutrition Follow-up  DOCUMENTATION CODES:   Non-severe (moderate) malnutrition in context of chronic illness  INTERVENTION:   -Ensure Plus High Protein po TID, each supplement provides 350 kcal and 20 grams of protein.    -Multivitamin with minerals daily   -Magic cup BID with meals, each supplement provides 290 kcal and 9 grams of protein    -Needs updated weight  NUTRITION DIAGNOSIS:   Moderate Malnutrition related to chronic illness, cancer and cancer related treatments as evidenced by mild fat depletion, mild muscle depletion.  Ongoing.  GOAL:   Patient will meet greater than or equal to 90% of their needs  Progressing.  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  CT of the head revealed increased edema in the area for left frontal lobe lesion with no acute hemorrhage or evidence of CVA.  MRI of the brain showed progression of lymphoma in the left frontal operculum.  Patient currently consuming 0-100% of meals, varies. Appetite improving. Pt accepting Ensure and MVI supplements.  Admission weight: 152 lbs Last weight: 134 lbs Needs updated weight, has been >2 weeks.  Medications: Vitamin C, Marinol, Pepcid, Lasix, Multivitamin with minerals daily, K-Phos, Zinc sulfate  Labs reviewed.  Diet Order:   Diet Order             Diet regular Room service appropriate? Yes; Fluid consistency: Thin  Diet effective now                   EDUCATION NEEDS:   No education needs have been identified at this time  Skin:  Skin Assessment: Reviewed RN Assessment  Last BM:  2/6  Height:   Ht Readings from Last 1 Encounters:  03/17/22 5' 7"$  (1.702 m)    Weight:   Wt Readings from  Last 1 Encounters:  03/27/22 61.1 kg   BMI:  Body mass index is 21.1 kg/m.  Estimated Nutritional Needs:   Kcal:  2050-2250  Protein:  100-110g  Fluid:  2.2L/day   Clayton Bibles, MS, RD, LDN Inpatient Clinical Dietitian Contact information available via Amion

## 2022-04-11 NOTE — TOC Progression Note (Signed)
Transition of Care [facility]) - Progression Note    Patient Details  Name: Ariel [name] MRN: [id number] Date of Birth: [birth year]  Transition of Care [facility]) CM/SW [place], [place] Phone Number: 04/11/2022, 3:22 PM  Clinical Narrative:    CSW spoke with pt over the phone regarding discharge plans. Pt is aware of and agreeable to plan for discharging to [facility] with home health services. FL-2 form completed and faxed to [facility] at [facility] at [phone]. CSW received call from [facility] stating that a new FL-2 will be needed the day of discharge with all of pt's discharge medications listed with name, strength, dosage, and route. Cannot accept MAR or hospital FL-2 with this information.   MD to complete Supplemental Phys[employer] Order form and CSW will fax to facility once complete.  Quantiferon gold TB test ordered. CSW will fax results to facility once completed.    Expected Discharge Plan: Skilled Nursing Facility Barriers to Discharge: SNF Covid  Expected Discharge Plan and Services In-house Referral: NA Discharge Planning Services: CM Consult Post Acute Care Choice: [place] Living arrangements for the past 2 months: Single Family Home                 DME Arranged: N/A DME Agency: NA       HH Arranged: NA HH Agency: NA         Social Determinants of Health (SDOH) Interventions SDOH Screenings   Food Insecurity: No Food Insecurity (03/18/2022)  Housing: Low Risk  (03/18/2022)  Transportation Needs: No Transportation Needs (03/18/2022)  Utilities: Not At Risk (03/18/2022)  Alcohol Screen: Low Risk  (07/08/2020)  Depression (PHQ2-9): Low Risk  (07/13/2021)  Financial Resource Strain: Low Risk  (07/13/2021)  Physical Activity: Insufficiently Active (07/13/2021)  Social Connections: Socially Isolated (07/13/2021)  Stress: No Stress Concern Present (07/13/2021)  Tobacco Use: Low Risk  (03/17/2022)    Readmission Risk Interventions     03/27/2022    3:00 PM 08/02/2021    3:11 PM 07/05/2021    2:17 PM  Readmission Risk Prevention Plan  Transportation Screening Complete Complete Complete  Medication Review Press photographer) Complete Complete Complete  PCP or Specialist appointment within 3-5 days of discharge Complete Complete Complete  HRI or [place] Complete Complete Complete  SW Recovery Care/Counseling Consult Complete Complete Complete  Palliative Care Screening Complete Not Applicable Not Applicable  Skilled Nursing Facility Complete Not Applicable Not Applicable

## 2022-04-11 NOTE — Progress Notes (Signed)
Patient continues to be admitted. She has not recovered as quickly as hoped. Plan is still for discharge to ALF this week. She is due her next cycle today, however this will be delayed due to patient condition.   Will continue to follow for post discharge needs and office follow up.   Oncology Nurse Navigator Documentation     04/11/2022    8:00 AM  Oncology Nurse Navigator Flowsheets  Navigator Follow Up Date: 04/18/2022  Navigator Follow Up Reason: Appointment Review  Navigator Location [facility]  Navigator Encounter Type Appt/Treatment Plan Review  Patient Visit Type MedOnc  Treatment Phase Active Tx  Barriers/Navigation Needs No Barriers At This Time  Interventions None Required  Acuity Level 1-No Barriers  Support Groups/Services Friends and Family  Time Spent with Patient 15

## 2022-04-11 NOTE — Progress Notes (Signed)
Occupational Therapy Treatment Patient Details Name: Ariel [name] MRN: [id number] DOB: 10-27-1955 Today's Date: 04/11/2022   History of present illness Patient is a 67 year old female presented 03/17/22 with  right facial droop, dysarthria, aphasia. MRI showed recurrence non-Hodgkin's lymphoma in left frontal lymphoma lesion.  Pt now with COVID. PMH-CNS lymphoma, melanoma of the skin, high-grade B-cell lymphoma   OT comments  Patient made progress towards goals. Patient was able to complete toileting tasks standing at sink for grooming and functional mobility in room during session with no LOB. Patient was educated on proper placemet of RW during session to improve safety. Patient's discharge plan remains appropriate at this time. OT will continue to follow acutely.     Recommendations for follow up therapy are one component of a multi-disciplinary discharge planning process, led by the attending physician.  Recommendations may be updated based on patient status, additional functional criteria and insurance authorization.    Follow Up Recommendations  Skilled nursing-short term rehab (<3 hours/day) (v.s. HH with 24/7 caregiver support)     Assistance Recommended at Discharge Frequent or constant Supervision/Assistance  Patient can return home with the following  A little help with bathing/dressing/bathroom;Assistance with cooking/housework;Direct supervision/assist for medications management;Direct supervision/assist for financial management;Assist for transportation   Equipment Recommendations  None recommended by OT       Precautions / Restrictions Precautions Precautions: Fall Restrictions Weight Bearing Restrictions: No              ADL either performed or assessed with clinical judgement   ADL Overall ADL's : Needs assistance/impaired     Grooming: Min guard;Standing;Wash/dry face;Wash/dry hands;Oral care Grooming Details (indicate cue type and reason): at sink in room  with increased time and education on proper positioning of RW with grooming at sink.                 Toilet Transfer: Min guard;Rolling walker (2 wheels);Ambulation Toilet Transfer Details (indicate cue type and reason): on opposite side of the room Toileting- Clothing Manipulation and Hygiene: Supervision/safety;Sit to/from stand Toileting - Clothing Manipulation Details (indicate cue type and reason): with RW     Functional mobility during ADLs: Min guard;Rolling walker (2 wheels)        Cognition Arousal/Alertness: Awake/alert Behavior During Therapy: WFL for tasks assessed/performed Overall Cognitive Status: Within Functional Limits for tasks assessed                       Pertinent Vitals/ Pain       Pain Assessment Pain Assessment: No/denies pain         Frequency  Min 2X/week        Progress Toward Goals  OT Goals(current goals can now be found in the care plan section)  Progress towards OT goals: Progressing toward goals            AM-PAC OT "6 Clicks" Daily Activity     Outcome Measure   Help from another person eating meals?: None Help from another person taking care of personal grooming?: A Little Help from another person toileting, which includes using toliet, bedpan, or urinal?: A Little Help from another person bathing (including washing, rinsing, drying)?: A Little Help from another person to put on and taking off regular upper body clothing?: None Help from another person to put on and taking off regular lower body clothing?: A Little 6 Click Score: 20    End of Session Equipment Utilized During Treatment: Gait belt;Rolling walker (2 wheels)  OT  Visit Diagnosis: Unsteadiness on feet (R26.81);Muscle weakness (generalized) (M62.81)   Activity Tolerance Patient tolerated treatment well   Patient Left in bed;with call bell/phone within reach (nurse aware that alarm is not in place)   Nurse Communication Mobility status         Time: [id number] OT Time Calculation (min): 17 min  Charges: OT General Charges $OT Visit: 1 Visit OT Treatments $Self Care/Home Management : 8-22 mins  Rennie Plowman, MS Acute Rehabilitation Department Office# [phone]   Willa Rough 04/11/2022, 4:21 PM

## 2022-04-12 DIAGNOSIS — U071 COVID-19: Secondary | ICD-10-CM | POA: Diagnosis not present

## 2022-04-12 DIAGNOSIS — C8589 Other specified types of non-Hodgkin lymphoma, extranodal and solid organ sites: Secondary | ICD-10-CM | POA: Diagnosis not present

## 2022-04-12 DIAGNOSIS — E222 Syndrome of inappropriate secretion of antidiuretic hormone: Secondary | ICD-10-CM | POA: Diagnosis not present

## 2022-04-12 DIAGNOSIS — C8510 Unspecified B-cell lymphoma, unspecified site: Secondary | ICD-10-CM | POA: Diagnosis not present

## 2022-04-12 LAB — CBC WITH DIFFERENTIAL/PLATELET
Abs Immature Granulocytes: 1.14 10*3/uL — ABNORMAL HIGH (ref 0.00–0.07)
Basophils Absolute: 0.1 10*3/uL (ref 0.0–0.1)
Basophils Relative: 1 %
Eosinophils Absolute: 0 10*3/uL (ref 0.0–0.5)
Eosinophils Relative: 0 %
HCT: 33.4 % — ABNORMAL LOW (ref 36.0–46.0)
Hemoglobin: 11.1 g/dL — ABNORMAL LOW (ref 12.0–15.0)
Immature Granulocytes: 14 %
Lymphocytes Relative: 6 %
Lymphs Abs: 0.5 10*3/uL — ABNORMAL LOW (ref 0.7–4.0)
MCH: 30.6 pg (ref 26.0–34.0)
MCHC: 33.2 g/dL (ref 30.0–36.0)
MCV: 92 fL (ref 80.0–100.0)
Monocytes Absolute: 0.5 10*3/uL (ref 0.1–1.0)
Monocytes Relative: 6 %
Neutro Abs: 5.8 10*3/uL (ref 1.7–7.7)
Neutrophils Relative %: 73 %
Platelets: 35 10*3/uL — ABNORMAL LOW (ref 150–400)
RBC: 3.63 MIL/uL — ABNORMAL LOW (ref 3.87–5.11)
RDW: 15.6 % — ABNORMAL HIGH (ref 11.5–15.5)
WBC: 7.9 10*3/uL (ref 4.0–10.5)
nRBC: 0 % (ref 0.0–0.2)

## 2022-04-12 LAB — QUANTIFERON-TB GOLD PLUS (RQFGPL)
QuantiFERON Mitogen Value: >10 IU/mL
QuantiFERON Nil Value: 0.25 IU/mL
QuantiFERON TB1 Ag Value: 0.22 IU/mL
QuantiFERON TB2 Ag Value: 0.13 IU/mL

## 2022-04-12 LAB — COMPREHENSIVE METABOLIC PANEL
ALT: 54 U/L — ABNORMAL HIGH (ref 0–44)
AST: 37 U/L (ref 15–41)
Albumin: 2.4 g/dL — ABNORMAL LOW (ref 3.5–5.0)
Alkaline Phosphatase: 70 U/L (ref 38–126)
Anion gap: 10 (ref 5–15)
BUN: 18 mg/dL (ref 8–23)
CO2: 29 mmol/L (ref 22–32)
Calcium: 9.3 mg/dL (ref 8.9–10.3)
Chloride: 95 mmol/L — ABNORMAL LOW (ref 98–111)
Creatinine, Ser: 0.6 mg/dL (ref 0.44–1.00)
GFR, Estimated: >60 mL/min (ref >60)
Glucose, Bld: 166 mg/dL — ABNORMAL HIGH (ref 70–99)
Potassium: 4.1 mmol/L (ref 3.5–5.1)
Sodium: 134 mmol/L — ABNORMAL LOW (ref 135–145)
Total Bilirubin: 0.5 mg/dL (ref 0.3–1.2)
Total Protein: 5.7 g/dL — ABNORMAL LOW (ref 6.5–8.1)

## 2022-04-12 LAB — D-DIMER, QUANTITATIVE: D-Dimer, Quant: 2.37 ug{FEU}/mL — ABNORMAL HIGH (ref 0.00–0.50)

## 2022-04-12 LAB — QUANTIFERON-TB GOLD PLUS: QuantiFERON-TB Gold Plus: NEGATIVE

## 2022-04-12 LAB — METHOTREXATE: Methotrexate: 1.18

## 2022-04-12 LAB — SEDIMENTATION RATE: Sed Rate: 93 mm/h — ABNORMAL HIGH (ref 0–22)

## 2022-04-12 LAB — PHOSPHORUS: Phosphorus: 3 mg/dL (ref 2.5–4.6)

## 2022-04-12 LAB — FERRITIN: Ferritin: 2161 ng/mL — ABNORMAL HIGH (ref 11–307)

## 2022-04-12 LAB — MAGNESIUM: Magnesium: 2.5 mg/dL — ABNORMAL HIGH (ref 1.7–2.4)

## 2022-04-12 LAB — C-REACTIVE PROTEIN: CRP: 1.7 mg/dL — ABNORMAL HIGH

## 2022-04-12 NOTE — Progress Notes (Signed)
Physical Therapy Treatment Patient Details Name: Ariel [name] MRN: [id number] DOB: Aug 15, 1955 Today's Date: 04/12/2022   History of Present Illness Patient is a 67 year old female presented 03/17/22 with  right facial droop, dysarthria, aphasia. MRI showed recurrence non-Hodgkin's lymphoma in left frontal lymphoma lesion.  Pt now with COVID. PMH-CNS lymphoma, melanoma of the skin, high-grade B-cell lymphoma    PT Comments    Patient progressing well and eager to work with therapy today. Min guard for transfers at EOB and toilet with cues for walker position. Pt able to complete pericare and don brief/pad with guarding for safety and set up assist.  Patient ambulated ~ 120' with RW, occasional cues for safe position. EOS completed functional and LE exercises for strengthening and encouraged to continue mobilizing with RN/NT staff. Will continue to progress as able.   Recommendations for follow up therapy are one component of a multi-disciplinary discharge planning process, led by the attending physician.  Recommendations may be updated based on patient status, additional functional criteria and insurance authorization.  Follow Up Recommendations  Skilled nursing-short term rehab (<3 hours/day) (SNF vs. ALF with HHPT (pt motivated but fatigues quickly)) Can patient physically be transported by private vehicle: Yes   Assistance Recommended at Discharge Intermittent Supervision/Assistance  Patient can return home with the following Assistance with cooking/housework;Help with stairs or ramp for entrance;A little help with walking and/or transfers;A little help with bathing/dressing/bathroom;Direct supervision/assist for medications management   Equipment Recommendations  None recommended by PT    Recommendations for Other Services OT consult     Precautions / Restrictions Precautions Precautions: Fall Restrictions Weight Bearing Restrictions: No     Mobility  Bed Mobility Overal bed  mobility: Needs Assistance Bed Mobility: Supine to Sit     Supine to sit: Supervision     General bed mobility comments: sup for safety, pt using rail and taking extra time    Transfers Overall transfer level: Needs assistance Equipment used: Rolling walker (2 wheels), None Transfers: Sit to/from Stand Sit to Stand: Min guard, Min assist           General transfer comment: pt required guarding for safety to rise from recliner and toilet with use of UE's. min assist to power up without use of UE's. pt ultimately using singel UE for repeat sit<>stands.    Ambulation/Gait Ambulation/Gait assistance: Min guard Gait Distance (Feet): 120 Feet Assistive device: Rolling walker (2 wheels) Gait Pattern/deviations: Step-through pattern Gait velocity: decreased     General Gait Details: guarding for safety, intermittent cues to maintain safe position to RW, no LOB throughout.   Stairs             Wheelchair Mobility    Modified Rankin (Stroke Patients Only)       Balance Overall balance assessment: Needs assistance Sitting-balance support: No upper extremity supported, Feet supported Sitting balance-Leahy Scale: Good     Standing balance support: No upper extremity supported, Bilateral upper extremity supported, Reliant on assistive device for balance Standing balance-Leahy Scale: Poor                              Cognition Arousal/Alertness: Awake/alert Behavior During Therapy: WFL for tasks assessed/performed Overall Cognitive Status: Within Functional Limits for tasks assessed                                 General Comments: cues for  safety. mild memory issues during session.        Exercises General Exercises - Lower Extremity Long Arc Quad: AROM, Strengthening, Both, Seated, 10 reps Toe Raises: AROM, Strengthening, Both, 10 reps, Standing Heel Raises: AROM, Strengthening, Both, 10 reps, Standing Other Exercises Other  Exercises: 5x sit<>stand: min assist to power up without use of UE's. pt ultimately using single UE and guarding.    General Comments        Pertinent Vitals/Pain Pain Assessment Pain Assessment: No/denies pain    Home Living                          Prior Function            PT Goals (current goals can now be found in the care plan section) Acute Rehab PT Goals Patient Stated Goal: Regain IND, likes to sew PT Goal Formulation: With patient Time For Goal Achievement: 04/14/22 Potential to Achieve Goals: Fair Progress towards PT goals: Progressing toward goals    Frequency    Min 2X/week      PT Plan Current plan remains appropriate    Co-evaluation              AM-PAC PT "6 Clicks" Mobility   Outcome Measure  Help needed turning from your back to your side while in a flat bed without using bedrails?: A Little Help needed moving from lying on your back to sitting on the side of a flat bed without using bedrails?: A Little Help needed moving to and from a bed to a chair (including a wheelchair)?: A Little Help needed standing up from a chair using your arms (e.g., wheelchair or bedside chair)?: A Little Help needed to walk in hospital room?: A Little Help needed climbing 3-5 steps with a railing? : A Lot 6 Click Score: 17    End of Session Equipment Utilized During Treatment: Gait belt Activity Tolerance: Patient tolerated treatment well Patient left: in chair;with call bell/phone within reach Nurse Communication: Mobility status PT Visit Diagnosis: Other abnormalities of gait and mobility (R26.89);Muscle weakness (generalized) (M62.81) Pain - Right/Left: Left Pain - part of body: Knee;Leg     Time: 1421-1450 PT Time Calculation (min) (ACUTE ONLY): 29 min  Charges:  $Gait Training: 8-22 mins $Therapeutic Exercise: 8-22 mins                     Verner Mould, DPT Acute Rehabilitation Services Office [phone]  04/12/22 4:00 PM

## 2022-04-12 NOTE — TOC Progression Note (Signed)
Transition of Care [facility]) - Progression Note    Patient Details  Name: Ariel [name] MRN: [id number] Date of Birth: [birth year]  Transition [facility]) CM/SW [place], LCSW Phone Number: 04/12/2022, 2:30 PM  Clinical Narrative:    Supplemental Physician Form has yet to be completed. CSW spoke with pt's daughter and requested she take form to pt's PCP to complete.    Expected Discharge Plan: Skilled Nursing Facility Barriers to Discharge: SNF Covid  Expected Discharge Plan and Services In-house Referral: NA Discharge Planning Services: CM Consult Post Acute Care Choice: [place] Living arrangements for the past 2 months: Single Family Home                 DME Arranged: N/A DME Agency: NA       HH Arranged: NA HH Agency: NA         Social Determinants of Health (SDOH) Interventions SDOH Screenings   Food Insecurity: No Food Insecurity (03/18/2022)  Housing: Low Risk  (03/18/2022)  Transportation Needs: No Transportation Needs (03/18/2022)  Utilities: Not At Risk (03/18/2022)  Alcohol Screen: Low Risk  (07/08/2020)  Depression (PHQ2-9): Low Risk  (07/13/2021)  Financial Resource Strain: Low Risk  (07/13/2021)  Physical Activity: Insufficiently Active (07/13/2021)  Social Connections: Socially Isolated (07/13/2021)  Stress: No Stress Concern Present (07/13/2021)  Tobacco Use: Low Risk  (03/17/2022)    Readmission Risk Interventions    03/27/2022    3:00 PM 08/02/2021    3:11 PM 07/05/2021    2:17 PM  Readmission Risk Prevention Plan  Transportation Screening Complete Complete Complete  Medication Review Press photographer) Complete Complete Complete  PCP or Specialist appointment within 3-5 days of discharge Complete Complete Complete  HRI or [place] Complete Complete Complete  SW Recovery Care/Counseling Consult Complete Complete Complete  Palliative Care Screening Complete Not Applicable Not Applicable  Skilled Nursing Facility  Complete Not Applicable Not Applicable

## 2022-04-12 NOTE — Progress Notes (Signed)
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  CT head revealed increased edema in the area of left frontal lobe lesion with no acute hemorrhage or evidence of CVA.  MRI of brain showed progression of lymphoma in the left frontal operculum.  EEG without epileptiform discharge or seizure.  Oncology, radiation oncology and neuro-oncology involved.  Patient was started on chemotherapy on 1/23 and completed first cycle.    Hospital course with new onset leukopenia with lymphopenia and mild neutropenia and worsening anemia and thrombocytopenia likely from chemotherapy she started on 1/23.  She also had intermittent fever raising concern for neutropenic fever.  CXR raises concern for pneumonia.  Urine culture on 1/22 with E. coli for which she completed antibiotic course.  Blood culture NGTD.  Remains on IV cefepime for neutropenic fever.  Blood cultures NGTD.  Oncology following.  Later in the hospitalization patient began to spike fevers again prompting repeat infectious workup revealing recurrent COVID-19 positivity suggestive of a new COVID-19 infection.   Patient was treated with a 5-day course of Paxlovid.  Additional complication late in the hospital course with spontaneous epistaxis on 2/9 which was controlled with nasal packing and 1 pack platelet transfusion.  Patient continued to be hypoxic with ambulation and therefore 2 unit packed red blood cell transfusion was administered on 2/12.   Therapy recommended SNF due to significant physical deconditioning.   Assessment and Plan: * [id number] virus infection "Patient found to be COVID-19 positive on 2/6 suspected to be a new infection  since the patient was COVID-negative 1/19 Bilateral lower lobe infiltrates persisting on repeat chest x-ray on 2/7 Paxlovid complete. Clinically improving Patient has additionally received a 5-day course of azithromycin. Airborne and contact isolation Zinc and vitamin C supplementation As needed antitussives and bronchodilator therapy  CNS lymphoma (HCC) Neurologic symptoms such as aphasia and unilateral weakness suffered several weeks ago seem to have improved Course of Decadron complete  Pancytopenia ([place]) White blood cell count continuing to downtrend. Oncology recommending 2 unit packed red blood cell transfusion 2/12 due to some degree of hypoxia with exertion Platelet count has remained stable status post 1 pack of platelet transfusion administered the evening of 2/9 due to epistaxis. Monitoring with serial CBCs Dr. Marin Olp with oncology following daily, his input is appreciated.  Epistaxis Patient exhibited some spontaneous epistaxis of the right nare evening of 2/9 This was in the setting of a platelet count of 21.  Night provider initiated 1 unit packed red blood cell transfusion as well as nasal packing resulting in hemostasis No further recurrence   Protein-calorie malnutrition, moderate (HCC) Nutrition consulted and following, their input is appreciated Providing patient with Ensure supplements 3 times daily in between meals Providing patient with zinc and vitamin C Encouraging oral intake   SIADH (syndrome of inappropriate ADH production) ([place]) Sodium level now normalized sodium chloride tablets to once daily. Off fluid restriction per patient request. Monitoring sodium levels with serial chemistries  Hypokalemia Replaced  Hypophosphatemia Replaced    Subjective:  Patient states that her strength is much improved after the 2 unit packed red blood cell transfusion yesterday.  Patient's dyspnea with exertion is also seemingly gotten much better.  Patient  continues to  not experience any recurrent episodes of epistaxis.  Physical Exam:  Vitals:   04/10/22 1827 04/10/22 1943 04/11/22 0354 04/11/22 2034  BP: 121/65 (!) 115/55 105/70 110/63  Pulse: 85 89 84 89  Resp:  17 17   Temp: 98 F (36.7 C) 97.9 F (36.6 C) 97.9 F (36.6 C) 97.9 F (36.6 C)  TempSrc: Oral Oral Oral Oral  SpO2: 97% 96% 96% 95%  Weight:      Height:        Constitutional: Awake alert and oriented x3, no associated distress.   Skin: Increased skin pallor noted, no rashes, no lesions, good skin turgor noted. Eyes: Pupils are equally reactive to light.  No evidence of scleral icterus or conjunctival pallor.  ENMT: Moist mucous membranes noted.  Posterior pharynx clear of any exudate or lesions.   Respiratory: Notable bibasilar rales, no evidence of wheezing, normal respiratory effort. No accessory muscle use.  Cardiovascular: Regular rate and rhythm, no murmurs / rubs / gallops. No extremity edema. 2+ pedal pulses. No carotid bruits.  Abdomen: Abdomen is soft and nontender.  No evidence of intra-abdominal masses.  Positive bowel sounds noted in all quadrants.   Musculoskeletal: No joint deformity upper and lower extremities. Good ROM, no contractures. Normal muscle tone.    Data Reviewed:  I have personally reviewed and interpreted labs, imaging.  Significant findings are   CBC: Recent Labs  Lab 04/06/22 0508 04/07/22 0535 04/08/22 0434 04/09/22 1000 04/11/22 0936  WBC 3.8* 2.4* 2.0* 1.6* 7.4  NEUTROABS 3.1 1.8 1.3* 1.0* 5.6  HGB 9.4* 8.8* 8.5* 8.2* 11.6*  HCT 27.9* 26.2* 25.6* 25.3* 34.8*  MCV 92.1 92.9 94.1 97.3 90.9  PLT 21* 21* 41* 43* 38*   Basic Metabolic Panel: Recent Labs  Lab 04/06/22 0508 04/07/22 0535 04/08/22 0434 04/09/22 0250 04/10/22 0306 04/11/22 0411 04/11/22 0936  NA 127* 131* 133* 135 136 135 136  K 3.9 4.0 3.9 4.3 4.3 3.8 3.9  CL 92* 96* 98 95* 97* 97* 95*  CO2 24 25 26 28 29 27 28  $ GLUCOSE 95 122* 104* 146* 127* 133*  141*  BUN 14 18 16 16 16 17 16  $ CREATININE 0.45 0.50 0.48 0.54 0.49 0.64 0.58  CALCIUM 8.4* 8.3* 8.5* 8.7* 8.8* 9.0 8.9  MG 2.0 2.2 2.1  --  2.2 2.2  --   PHOS 2.5 2.8 3.5 3.2  --  4.0  --    GFR: Estimated Creatinine Clearance: 65.8 mL/min (by C-G formula based on SCr of 0.58 mg/dL). Liver Function Tests: Recent Labs  Lab 04/08/22 0434 04/09/22 0250 04/10/22 0306 04/11/22 0411 04/11/22 0936  AST 44* 32 32 39 36  ALT 95* 71* 62* 61* 54*  ALKPHOS 72 64 64 77 70  BILITOT 0.4 0.4 0.3 0.6 0.5  PROT 5.7* 5.7* 6.1* 6.4* 5.9*  ALBUMIN 2.2* 2.2* 2.6* 2.7* 2.8*    Code Status:  Full code.   Severity of Illness:  The appropriate patient status for this patient is INPATIENT. Inpatient status is judged to be reasonable and necessary in order to provide the required intensity of service to ensure the patient's safety. The patient's presenting symptoms, physical exam findings, and initial radiographic and laboratory data in the context of their chronic comorbidities is felt to place them at high risk for further clinical deterioration. Furthermore, it is not anticipated that the patient will be medically stable for discharge from the hospital within 2 midnights of admission.   * I certify that at the  point of admission it is my clinical judgment that the patient will require inpatient hospital care spanning beyond 2 midnights from the point of admission due to high intensity of service, high risk for further deterioration and high frequency of surveillance required.*  Time spent:  35 minutes  Author:  Vernelle Emerald MD  04/12/2022 1:03 AM

## 2022-04-12 NOTE — Progress Notes (Signed)
Ms. Yell looks and feels much better today.  Hopefully, this is a indicator that she can finally be discharged and go to skilled nursing/assisted living.  She did get a blood transfusion on Monday.  We do have her on Neupogen.  Both have worked very nicely.  She has not complained of any cough.  She feels that her breathing is doing better.  Her appetite is better.  She has had no nausea or vomiting.  She has had no problems with bowels or bladder.  Her CBC today shows white count 7.9.  Hemoglobin 11.1.  Platelet count 35,000.  She has normal white cell differential.  Her sodium is 134.  Potassium is 4.1.  BUN 18 creatinine 0.6.  Calcium is 9.3 with an albumin of 2.4.  Her SGPT is 37 SGOT is 54.  Her ferritin has been on the high side at 2100.  She has not complained of any pain.  She has been doing her occupational therapy and physical therapy.  I think she set up in a chair more so yesterday.  There is no headache.  There is no visual changes.  We really need to get started on her next cycle of chemotherapy.  However, her platelet count is just too low for treatment.  Hopefully, this will improve.  She has had no rashes.  There is been no epistaxis.  On her physical exam, her vital signs show temperature of 98.2.  Pulse 100.  Blood pressure 104/63.  Her head and neck exam shows no mucositis.  There is no adenopathy in the neck.  Her lungs are clear bilaterally.  Cardiac exam regular rate and rhythm.  She has no murmurs.  Abdomen is soft.  Bowel sounds are present.  There is no fluid wave.  Extremity shows no clubbing, cyanosis or edema.  Neurological exam shows no focal neurological deficits.  I am just happy that her blood counts are improving.  She has responded very nicely to the Neupogen.  Her hemoglobin is also come up well.  She just looks better.  Hopefully, her platelet count will improve.  I do appreciate everybody's help on 5 E.   [name], MD  Colossians 3:14

## 2022-04-12 NOTE — Progress Notes (Signed)
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 CT of the head revealed increased edema in the area of her left frontal lobe lesion with no acute hemorrhage or evidence of CVA. MRI of the brain has revealed progression of lymphoma in the left frontal operculum with improvement in the cerebellum.    Subjective: A/O x 4, patient states that she understands the plan, Dr. Marin Olp oncology spoke with her at noon.   Assessment & Plan: Covid vaccination;   Principal Problem:   COVID-19 virus infection Active Problems:   CNS lymphoma ([place])   Pancytopenia ([place])   Epistaxis   Protein-calorie malnutrition, moderate (HCC)   SIADH (syndrome of inappropriate ADH production) (HCC)   Hypokalemia   Hypophosphatemia   Hyperlipidemia   Melanoma ([place])   High grade B-cell lymphoma (HCC)   Diffuse large B cell lymphoma ([place])   Aphasia   Palliative care by specialist   Positive COVID-19 pneumonia - 2/7 bilateral lower lobe infiltrates persisting a repeat CXR on 2/7 COVID-19 Labs  Recent Labs    04/12/22 1057  DDIMER 2.37*  FERRITIN 2,161*  CRP 1.7*    Lab Results  Component Value Date   [place] (A) 04/04/2022   [place] NEGATIVE 03/17/2022   SARSCOV2NAA POSITIVE (A) 02/21/2022   [place] NEGATIVE 03/07/2021  - Completed Paxlovid -Completed 5-day course of azithromycin -Patient with elevated D-dimer, currently negative SOB, lower extremity pain.  Continue to trend.  May require workup.   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 Burney Gauze as well as Radiation Oncology/Neuro Oncology - pending transfer to [facility] to  initiate chemotherapy - EEG without evidence of epileptiform discharges  -Treatment per Dr. Marin Olp oncology -1/23 chemo begun per oncology. - 2/24 per Dr. Marin Olp oncology wants to start patient on next cycle of chemotherapy.  However concern that platelet count is still too low to begin treatment.  Follow oncology recommendations   FUO -Perhaps a consequence of her lymphoma? - no clear localizing sx presently - urine dipstick + for leukocytes - sent urine for culture - CoViD + Flu + RSV negative at admit - CXR unrevealing at admit  -withhold abx for now as there is no clear source to treat  -2/14 resolved   Aphasia with right-sided weakness -Due to left frontal lobe CNS lymphoma lesion with worsening edema -continue high-dose steroids -definitive treatment via chemotherapy upon transfer to [facility]  -Resolved with ongoing use of Decadron -2/14 completed course of Decadron  Epistaxis - 2/14 resolved  Hyponatremia/SIADH -Likely simply due to poor intake -2/14 NaCl tablets daily - 2/14 mild asymptomatic continue to monitor closely - 2/14 off fluid restriction per patient request   Use of high-dose steroid -Decadron 10 mg daily per oncology  -2/14 treatment course complete  Moderate protein calorie malnutrition - Continue supplements per nutrition.  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  Progression of lymphoma in the left frontal operculum.  Evaluate for change of CODE STATUS to  DNR, palliative care vs hospice   Mobility Assessment (last 72 hours)     Mobility Assessment     Row Name 04/11/22 1616 04/10/22 2000 04/10/22 1200 04/10/22 1041     Does patient have an order for bedrest or is patient medically unstable -- No - Continue assessment No - Continue  assessment --    What is the highest level of mobility based on the progressive mobility assessment? Level 5 (Walks with assist in room/hall) - Balance while stepping forward/back and can walk in room with assist - Complete Level 5 (Walks with assist in room/hall) - Balance while stepping forward/back and can walk in room with assist - Complete Level 5 (Walks with assist in room/hall) - Balance while stepping forward/back and can walk in room with assist - Complete Level 5 (Walks with assist in room/hall) - Balance while stepping forward/back and can walk in room with assist - Complete    Is the above level different from baseline mobility prior to current illness? -- Yes - Recommend PT order Yes - Recommend PT order --                   DVT prophylaxis: Lovenox Code Status: Full Family Communication:  Status is: Inpatient    Dispo: The patient is from: Home              Anticipated d/c is to: SNF              Anticipated d/c date is: > 3 days              Patient currently is not medically stable to d/c.      Consultants:  Dr. Marin Olp oncology   Procedures/Significant Events:    I have personally reviewed and interpreted all radiology studies and my findings are as above.  VENTILATOR SETTINGS:    Cultures 2/6 influenza A/B  negative 2/6 SARS coronavirus positive    Antimicrobials: Anti-infectives (From admission, onward)    Start     Dose/Rate Route Frequency Ordered Stop   04/05/22 2300  nirmatrelvir/ritonavir (PAXLOVID) 3 tablet        3 tablet Oral 2 times daily 04/05/22 1958 04/10/22 0918   04/04/22 1300  azithromycin (ZITHROMAX) 500 mg in sodium chloride 0.9 % 250 mL IVPB        500 mg 250 mL/hr over 60 Minutes Intravenous Every 24 hours 04/04/22 1227 04/06/22 1449   03/29/22 0800  ceFEPIme (MAXIPIME) 2 g in sodium chloride 0.9 % 100 mL IVPB        2 g 200 mL/hr over 30 Minutes Intravenous Every 8 hours 03/29/22 0727 04/04/22 1846   03/27/22 1000   fluconazole (DIFLUCAN) tablet 100 mg        100 mg Oral Daily 03/27/22 0701     03/25/22 1000  fluconazole (DIFLUCAN) tablet 200 mg  Status:  Discontinued        200 mg Oral Daily 03/25/22 0649 03/27/22 0701   03/25/22 1000  famciclovir (FAMVIR) tablet 500 mg        500 mg Oral Daily 03/25/22 0649     03/23/22 1000  cephALEXin (KEFLEX) capsule 500 mg        500 mg Oral Every 12 hours 03/22/22 1056 03/27/22 2116   03/22/22 0800  ciprofloxacin (CIPRO) tablet 500 mg  Status:  Discontinued        500 mg Oral 2 times daily 03/22/22 0635 03/22/22 0703   03/22/22 0800  cefTRIAXone (ROCEPHIN) 1 g in sodium chloride 0.9 % 100 mL IVPB  Status:  Discontinued        1 g 200 mL/hr over 30 Minutes Intravenous Every 24 hours 03/22/22 0703 03/22/22 1056           Devices    LINES / TUBES:      Continuous Infusions:     Objective: Vitals:   04/11/22 0354 04/11/22 2034 04/12/22 0452 04/12/22 0545  BP: 105/70 110/63 (!) 103/48   Pulse: 84 89 76   Resp: 17     Temp: 97.9 F (36.6 C) 97.9 F (36.6 C) 97.9 F (36.6 C)   TempSrc: Oral Oral Oral   SpO2: 96% 95% 96%   Weight:    61.8 kg  Height:        Intake/Output Summary (Last 24 hours) at 04/12/2022 [id number] Last data filed at 04/12/2022 0545 Gross per 24 hour  Intake 240 ml  Output 1200 ml  Net -960 ml    Filed Weights   03/17/22 1823 03/27/22 0622 04/12/22 0545  Weight: 69 kg 61.1 kg 61.8 kg    Physical Exam:  General: A/O x 4, No acute respiratory distress, cachectic Eyes: negative scleral hemorrhage, negative anisocoria, negative icterus ENT: Negative Runny nose, negative gingival bleeding, Neck:  Negative scars, masses, torticollis, lymphadenopathy, JVD Lungs: Clear to auscultation bilaterally without wheezes or crackles Cardiovascular: Regular rate and rhythm without murmur gallop or rub normal S1 and S2 Abdomen: negative abdominal pain, nondistended, positive soft, bowel sounds, no rebound, no ascites, no appreciable  mass Extremities: No significant cyanosis, clubbing, or edema bilateral lower extremities Skin: Negative rashes, lesions, ulcers Psychiatric:  Negative depression, negative anxiety, negative fatigue, negative mania  Central nervous system:  Cranial nerves II through XII intact, tongue/uvula midline, all extremities muscle strength 5/5, sensation intact throughout, negative dysarthria, negative expressive aphasia, negative receptive aphasia.     .     Data Reviewed: Care during the described time interval was provided by me .  I have reviewed this patient's available data, including medical history, events of note, physical examination, and all test results as part of my evaluation.  CBC: Recent Labs  Lab 04/07/22 0535 04/08/22 0434 04/09/22 1000 04/11/22 0936 04/12/22 0336  WBC 2.4* 2.0* 1.6* 7.4 7.9  NEUTROABS 1.8 1.3* 1.0* 5.6 5.8  HGB 8.8* 8.5* 8.2* 11.6* 11.1*  HCT 26.2* 25.6* 25.3* 34.8* 33.4*  MCV 92.9 94.1 97.3 90.9 92.0  PLT 21* 41* 43* 38* 35*    Basic Metabolic Panel: Recent Labs  Lab 04/06/22 0508 04/07/22 0535 04/08/22 0434 04/09/22 0250 04/10/22 0306 04/11/22 0411 04/11/22 0936 04/12/22 0336  NA 127* 131* 133* 135 136 135 136 134*  K 3.9 4.0 3.9 4.3 4.3 3.8 3.9 4.1  CL 92* 96* 98 95* 97* 97* 95* 95*  CO2 24 25 26 28 29 27 28 29  $ GLUCOSE 95 122* 104* 146* 127* 133* 141* 166*  BUN 14 18 16 16 16 17 16 18  $ CREATININE 0.45 0.50 0.48 0.54 0.49 0.64 0.58 0.60  CALCIUM 8.4* 8.3* 8.5* 8.7* 8.8* 9.0 8.9 9.3  MG 2.0 2.2 2.1  --  2.2 2.2  --   --   PHOS 2.5 2.8 3.5 3.2  --  4.0  --   --     GFR: Estimated Creatinine Clearance: 66.4 mL/min (by C-G formula based on SCr of 0.6 mg/dL). Liver Function Tests: Recent Labs  Lab 04/09/22 0250 04/10/22 0306 04/11/22 0411 04/11/22 0936 04/12/22  0336  AST 32 32 39 36 37  ALT 71* 62* 61* 54* 54*  ALKPHOS 64 64 77 70 70  BILITOT 0.4 0.3 0.6 0.5 0.5  PROT 5.7* 6.1* 6.4* 5.9* 5.7*  ALBUMIN 2.2* 2.6* 2.7* 2.8*  2.4*    No results for input(s): "LIPASE", "AMYLASE" in the last 168 hours. Recent Labs  Lab 04/05/22 1430  AMMONIA 19   Coagulation Profile: Recent Labs  Lab 04/09/22 0250  INR 1.0    Cardiac Enzymes: No results for input(s): "CKTOTAL", "CKMB", "CKMBINDEX", "TROPONINI" in the last 168 hours. BNP (last 3 results) No results for input(s): "PROBNP" in the last 8760 hours. HbA1C: No results for input(s): "HGBA1C" in the last 72 hours. CBG: No results for input(s): "GLUCAP" in the last 168 hours.  Lipid Profile: No results for input(s): "CHOL", "HDL", "LDLCALC", "TRIG", "CHOLHDL", "LDLDIRECT" in the last 72 hours. Thyroid Function Tests: No results for input(s): "TSH", "T4TOTAL", "FREET4", "T3FREE", "THYROIDAB" in the last 72 hours. Anemia Panel: No results for input(s): "VITAMINB12", "FOLATE", "FERRITIN", "TIBC", "IRON", "RETICCTPCT" in the last 72 hours. Sepsis Labs: Recent Labs  Lab 04/06/22 0508 04/07/22 0535  PROCALCITON 0.11 <0.10    Recent Results (from the past 240 hour(s))  Respiratory (~20 pathogens) panel by PCR     Status: None   Collection Time: 04/04/22  3:34 PM   Specimen: Nasopharyngeal Swab; Respiratory  Result Value Ref Range Status   Adenovirus NOT DETECTED NOT DETECTED Final   Coronavirus 229E NOT DETECTED NOT DETECTED Final    Comment: (NOTE) The Coronavirus on the Respiratory Panel, DOES NOT test for the novel  Coronavirus (2019 nCoV)    Coronavirus HKU1 NOT DETECTED NOT DETECTED Final   Coronavirus NL63 NOT DETECTED NOT DETECTED Final   Coronavirus OC43 NOT DETECTED NOT DETECTED Final   Metapneumovirus NOT DETECTED NOT DETECTED Final   Rhinovirus / Enterovirus NOT DETECTED NOT DETECTED Final   Influenza A NOT DETECTED NOT DETECTED Final   Influenza B NOT DETECTED NOT DETECTED Final   Parainfluenza Virus 1 NOT DETECTED NOT DETECTED Final   Parainfluenza Virus 2 NOT DETECTED NOT DETECTED Final   Parainfluenza Virus 3 NOT DETECTED NOT  DETECTED Final   Parainfluenza Virus 4 NOT DETECTED NOT DETECTED Final   Respiratory Syncytial Virus NOT DETECTED NOT DETECTED Final   Bordetella pertussis NOT DETECTED NOT DETECTED Final   Bordetella Parapertussis NOT DETECTED NOT DETECTED Final   Chlamydophila pneumoniae NOT DETECTED NOT DETECTED Final   Mycoplasma pneumoniae NOT DETECTED NOT DETECTED Final    Comment: Performed at [facility], [address] [place], [place] [address]  Resp panel by RT-PCR (RSV, Flu A&B, Covid) Anterior Nasal Swab     Status: Abnormal   Collection Time: 04/04/22  3:34 PM   Specimen: Anterior Nasal Swab  Result Value Ref Range Status   SARS Coronavirus 2 by RT PCR POSITIVE (A) NEGATIVE Final    Comment: (NOTE) SARS-CoV-2 target nucleic acids are DETECTED.  The SARS-CoV-2 RNA is generally detectable in upper respiratory specimens during the acute phase of infection. Positive results are indicative of the presence of the identified virus, but do not rule out bacterial infection or co-infection with other pathogens not detected by the test. Clinical correlation with patient history and other diagnostic information is necessary to determine patient infection status. The expected result is Negative.  Fact Sheet for Patients: EntrepreneurPulse.com.au  Fact Sheet for Healthcare Providers: IncredibleEmployment.be  This test is not yet approved or cleared by the  [place] Architectural technologist and  has been authorized for detection and/or diagnosis of SARS-CoV-2 by FDA under an Print production planner (EUA).  This EUA will remain in effect (meaning this test can be used) for the duration of  the COVID-19 declaration under Section 564(b)(1) of the A ct, 21 U.S.C. section 360bbb-3(b)(1), unless the authorization is terminated or revoked sooner.     Influenza A by PCR NEGATIVE NEGATIVE Final   Influenza B by PCR NEGATIVE NEGATIVE Final    Comment: (NOTE) The Xpert  Xpress SARS-CoV-2/FLU/RSV plus assay is intended as an aid in the diagnosis of influenza from Nasopharyngeal swab specimens and should not be used as a sole basis for treatment. Nasal washings and aspirates are unacceptable for Xpert Xpress SARS-CoV-2/FLU/RSV testing.  Fact Sheet for Patients: EntrepreneurPulse.com.au  Fact Sheet for Healthcare Providers: IncredibleEmployment.be  This test is not yet approved or cleared by the [place] FDA and has been authorized for detection and/or diagnosis of SARS-CoV-2 by FDA under an Emergency Use Authorization (EUA). This EUA will remain in effect (meaning this test can be used) for the duration of the COVID-19 declaration under Section 564(b)(1) of the Act, 21 U.S.C. section 360bbb-3(b)(1), unless the authorization is terminated or revoked.     Resp Syncytial Virus by PCR NEGATIVE NEGATIVE Final    Comment: (NOTE) Fact Sheet for Patients: EntrepreneurPulse.com.au  Fact Sheet for Healthcare Providers: IncredibleEmployment.be  This test is not yet approved or cleared by the [place] FDA and has been authorized for detection and/or diagnosis of SARS-CoV-2 by FDA under an Emergency Use Authorization (EUA). This EUA will remain in effect (meaning this test can be used) for the duration of the COVID-19 declaration under Section 564(b)(1) of the Act, 21 U.S.C. section 360bbb-3(b)(1), unless the authorization is terminated or revoked.  Performed at [facility], [place] [address], [place], [place] [address]          Radiology Studies: No results found.      Scheduled Meds:  antiseptic oral rinse  15 mL Mouth Rinse Q6H   vitamin C  500 mg Oral Daily   benzonatate  100 mg Oral Q4H   Chlorhexidine Gluconate Cloth  6 each Topical Daily   dronabinol  2.5 mg Oral BID AC   famciclovir  500 mg Oral Daily   famotidine  20 mg Oral Daily    feeding supplement  237 mL Oral TID BM   fluconazole  100 mg Oral Daily   furosemide  20 mg Intravenous BID   multivitamin with minerals  1 tablet Oral Daily   naphazoline-glycerin  2 drop Both Eyes Q8H   phosphorus  250 mg Oral TID   sodium bicarbonate/sodium chloride   Mouth Rinse Q4H   sodium chloride flush  10-40 mL Intracatheter Q12H   sodium chloride  1 g Oral Daily   zinc sulfate  220 mg Oral Daily   Continuous Infusions:     LOS: 26 days    Time spent:40 min    Ambyr Qadri, Geraldo Docker, MD [facility]   If 7PM-7AM, please contact night-coverage 04/12/2022, 6:28 AM

## 2022-04-13 ENCOUNTER — Inpatient Hospital Stay (HOSPITAL_COMMUNITY): Payer: Medicare Other

## 2022-04-13 ENCOUNTER — Telehealth: Payer: Self-pay | Admitting: Family

## 2022-04-13 DIAGNOSIS — C8589 Other specified types of non-Hodgkin lymphoma, extranodal and solid organ sites: Secondary | ICD-10-CM | POA: Diagnosis not present

## 2022-04-13 DIAGNOSIS — U071 COVID-19: Secondary | ICD-10-CM | POA: Diagnosis not present

## 2022-04-13 DIAGNOSIS — C8510 Unspecified B-cell lymphoma, unspecified site: Secondary | ICD-10-CM | POA: Diagnosis not present

## 2022-04-13 DIAGNOSIS — E222 Syndrome of inappropriate secretion of antidiuretic hormone: Secondary | ICD-10-CM | POA: Diagnosis not present

## 2022-04-13 LAB — COMPREHENSIVE METABOLIC PANEL
ALT: 51 U/L — ABNORMAL HIGH (ref 0–44)
AST: 37 U/L (ref 15–41)
Albumin: 2.5 g/dL — ABNORMAL LOW (ref 3.5–5.0)
Alkaline Phosphatase: 74 U/L (ref 38–126)
Anion gap: 10 (ref 5–15)
BUN: 17 mg/dL (ref 8–23)
CO2: 30 mmol/L (ref 22–32)
Calcium: 9.2 mg/dL (ref 8.9–10.3)
Chloride: 95 mmol/L — ABNORMAL LOW (ref 98–111)
Creatinine, Ser: 0.53 mg/dL (ref 0.44–1.00)
GFR, Estimated: >60 mL/min (ref >60)
Glucose, Bld: 135 mg/dL — ABNORMAL HIGH (ref 70–99)
Potassium: 3.9 mmol/L (ref 3.5–5.1)
Sodium: 135 mmol/L (ref 135–145)
Total Bilirubin: 0.5 mg/dL (ref 0.3–1.2)
Total Protein: 5.9 g/dL — ABNORMAL LOW (ref 6.5–8.1)

## 2022-04-13 LAB — CBC WITH DIFFERENTIAL/PLATELET
Abs Immature Granulocytes: 0.51 10*3/uL — ABNORMAL HIGH (ref 0.00–0.07)
Basophils Absolute: 0.1 10*3/uL (ref 0.0–0.1)
Basophils Relative: 1 %
Eosinophils Absolute: 0 10*3/uL (ref 0.0–0.5)
Eosinophils Relative: 1 %
HCT: 32.2 % — ABNORMAL LOW (ref 36.0–46.0)
Hemoglobin: 10.5 g/dL — ABNORMAL LOW (ref 12.0–15.0)
Immature Granulocytes: 9 %
Lymphocytes Relative: 11 %
Lymphs Abs: 0.6 10*3/uL — ABNORMAL LOW (ref 0.7–4.0)
MCH: 30.2 pg (ref 26.0–34.0)
MCHC: 32.6 g/dL (ref 30.0–36.0)
MCV: 92.5 fL (ref 80.0–100.0)
Monocytes Absolute: 0.7 10*3/uL (ref 0.1–1.0)
Monocytes Relative: 12 %
Neutro Abs: 3.7 10*3/uL (ref 1.7–7.7)
Neutrophils Relative %: 66 %
Platelets: 32 10*3/uL — ABNORMAL LOW (ref 150–400)
RBC: 3.48 MIL/uL — ABNORMAL LOW (ref 3.87–5.11)
RDW: 15.2 % (ref 11.5–15.5)
WBC: 5.5 10*3/uL (ref 4.0–10.5)
nRBC: 0 % (ref 0.0–0.2)

## 2022-04-13 LAB — C-REACTIVE PROTEIN: CRP: 1.5 mg/dL — ABNORMAL HIGH (ref <1.0)

## 2022-04-13 LAB — SEDIMENTATION RATE: Sed Rate: 78 mm/hr — ABNORMAL HIGH (ref 0–22)

## 2022-04-13 LAB — D-DIMER, QUANTITATIVE: D-Dimer, Quant: 2.26 ug/mL-FEU — ABNORMAL HIGH (ref 0.00–0.50)

## 2022-04-13 LAB — MAGNESIUM: Magnesium: 2.4 mg/dL (ref 1.7–2.4)

## 2022-04-13 LAB — LACTATE DEHYDROGENASE: LDH: 161 U/L (ref 98–192)

## 2022-04-13 LAB — FERRITIN: Ferritin: 2177 ng/mL — ABNORMAL HIGH (ref 11–307)

## 2022-04-13 LAB — PHOSPHORUS: Phosphorus: 3.5 mg/dL (ref 2.5–4.6)

## 2022-04-13 NOTE — TOC Progression Note (Addendum)
Transition of Care [facility]) - Progression Note    Patient Details  Name: Ariel [name] MRN: [id number] Date of Birth: February 23, 1956  Transition of Care [facility]) CM/SW [place], LCSW Phone Number: 04/13/2022, 10:57 AM  Clinical Narrative:    Met with pt to discuss discharge plans. Pt is hopeful to be able to discharge to ALF on Friday.  Quantiferon gold lab has been collected and currently awaiting results to send to [facility].  Pt's daughter sent Supplemental Order for to pt's PCP to complete on 2/14. This will need to be sent to [facility] prior to pt being able to transfer.  CSW will follow up with daughter and PCP for completed form.   Update 3:50pm- Supplemental Order Form completed by Dr. Sherral Hammers. CSW faxed order form and TB results to [facility] for review.   Update 4:10pm- Spoke with Imane at [facility] who confirms pt is able to transfer to their facility tomorrow however will not have medications for pt due to not knowing discharge medications. CSW will have meds delivered to bed prior to discharge to ensure pt will have medication once she arrives at facility. Spoke with pt's daughter and confirmed plan. Pt's daughter shares family will be unable to provide transportation at discharge and requests EMS transport. [facility] will be arranged for transportation.    Expected Discharge Plan: Skilled Nursing Facility Barriers to Discharge: SNF Covid  Expected Discharge Plan and Services In-house Referral: NA Discharge Planning Services: CM Consult Post Acute Care Choice: [place] Living arrangements for the past 2 months: Single Family Home                 DME Arranged: N/A DME Agency: NA       HH Arranged: NA HH Agency: NA         Social Determinants of Health (SDOH) Interventions SDOH Screenings   Food Insecurity: No Food Insecurity (03/18/2022)  Housing: Low Risk  (03/18/2022)  Transportation Needs: No Transportation Needs  (03/18/2022)  Utilities: Not At Risk (03/18/2022)  Alcohol Screen: Low Risk  (07/08/2020)  Depression (PHQ2-9): Low Risk  (07/13/2021)  Financial Resource Strain: Low Risk  (07/13/2021)  Physical Activity: Insufficiently Active (07/13/2021)  Social Connections: Socially Isolated (07/13/2021)  Stress: No Stress Concern Present (07/13/2021)  Tobacco Use: Low Risk  (03/17/2022)    Readmission Risk Interventions    03/27/2022    3:00 PM 08/02/2021    3:11 PM 07/05/2021    2:17 PM  Readmission Risk Prevention Plan  Transportation Screening Complete Complete Complete  Medication Review Press photographer) Complete Complete Complete  PCP or Specialist appointment within 3-5 days of discharge Complete Complete Complete  HRI or [place] Complete Complete Complete  SW Recovery Care/Counseling Consult Complete Complete Complete  Palliative Care Screening Complete Not Applicable Not Applicable  Skilled Nursing Facility Complete Not Applicable Not Applicable

## 2022-04-13 NOTE — Progress Notes (Signed)
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 CT of the head revealed increased edema in the area of her left frontal lobe lesion with no acute hemorrhage or evidence of CVA. MRI of the brain has revealed progression of lymphoma in the left frontal operculum with improvement in the cerebellum.    Subjective: 2/15 afebrile overnight A/O x 4, patient anxious for discharge in a.m.         Assessment & Plan: Covid vaccination;   Principal Problem:   COVID-19 virus infection Active Problems:   CNS lymphoma ([place])   Pancytopenia ([place])   Epistaxis   Protein-calorie malnutrition, moderate (HCC)   SIADH (syndrome of inappropriate ADH production) (HCC)   Hypokalemia   Hypophosphatemia   Hyperlipidemia   Melanoma ([place])   High grade B-cell lymphoma (HCC)   Diffuse large B cell lymphoma ([place])   Aphasia   Palliative care by specialist   Positive COVID-19 pneumonia - 2/7 bilateral lower lobe infiltrates persisting a repeat CXR on 2/7 COVID-19 Labs  Recent Labs    04/12/22 1057 04/13/22 0445  DDIMER 2.37* 2.26*  FERRITIN 2,161* 2,177*  LDH  --  161  CRP 1.7* 1.5*     Lab Results  Component Value Date   SARSCOV2NAA POSITIVE (A) 04/04/2022   [place] NEGATIVE 03/17/2022   SARSCOV2NAA POSITIVE (A) 02/21/2022   [place] NEGATIVE 03/07/2021  - Completed Paxlovid -Completed 5-day course of azithromycin -Patient with elevated D-dimer, currently negative SOB, lower extremity pain.  Continue to trend.  May require workup. -2/15 D-dimer trending down along with other COVID markers.   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 Burney Gauze as well as Radiation Oncology/Neuro Oncology - pending transfer to [facility] to initiate chemotherapy - EEG without evidence of epileptiform discharges  -Treatment per Dr. Marin Olp oncology -1/23 chemo begun per oncology. - 2/14 per Dr. Marin Olp oncology wants to start patient on next cycle of chemotherapy.  However concern that platelet count is still too low to begin treatment.  Follow oncology recommendations -2/15 follow-up with Dr. Marin Olp oncology next week per his note for treatment.  SNF will be responsible for transport   FUO -Perhaps a consequence of her lymphoma? - no clear localizing sx presently - urine dipstick + for leukocytes - sent urine for culture - CoViD + Flu + RSV negative at admit - CXR unrevealing at admit  -withhold abx for now as there is no clear source to treat  -2/14 resolved   Aphasia with right-sided weakness -Due to left frontal lobe CNS lymphoma lesion with worsening edema -continue high-dose steroids -definitive treatment via chemotherapy upon transfer to [facility]  -Resolved with ongoing use of Decadron -2/14 completed course of Decadron  Epistaxis - 2/14 resolved  Hyponatremia/SIADH -Likely simply due to poor intake -2/14 NaCl tablets daily - 2/14 mild asymptomatic continue to monitor closely - 2/14 off fluid restriction per patient request   Use of high-dose steroid -Decadron 10 mg daily per oncology  -2/14 treatment course complete  Moderate protein calorie malnutrition - Continue supplements per nutrition.  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  Progression of lymphoma in the left frontal operculum.  Evaluate for change of CODE STATUS to DNR, palliative care vs hospice -2/15 discuss case with LCSW Ailene Ravel we are  waiting results of TB test and paperwork that spouse is having signed by PCP.  Hopefully discharge on 2/16   Mobility Assessment (last 72 hours)     Mobility Assessment     Row Name 04/13/22 [id number] 04/12/22 1433 04/12/22 1012 04/11/22 1616 04/10/22 2000   Does patient have an order for bedrest or is patient medically unstable No - Continue assessment -- No - Continue assessment -- No - Continue assessment   What is the highest level of mobility based on the progressive mobility assessment? Level 5 (Walks with assist in room/hall) - Balance while stepping forward/back and can walk in room with assist - Complete Level 5 (Walks with assist in room/hall) - Balance while stepping forward/back and can walk in room with assist - Complete Level 5 (Walks with assist in room/hall) - Balance while stepping forward/back and can walk in room with assist - Complete Level 5 (Walks with assist in room/hall) - Balance while stepping forward/back and can walk in room with assist - Complete Level 5 (Walks with assist in room/hall) - Balance while stepping forward/back and can walk in room with assist - Complete   Is the above level different from baseline mobility prior to current illness? Yes - Recommend PT order -- Yes - Recommend PT order -- Yes - Recommend PT order    [place] Name 04/10/22 1200           Does patient have an order for bedrest or is patient medically unstable No - Continue assessment       What is the highest level of mobility based on the progressive mobility assessment? Level 5 (Walks with assist in room/hall) - Balance while stepping forward/back and can walk in room with assist - Complete       Is the above level different from baseline mobility prior to current illness? Yes - Recommend PT order                      DVT prophylaxis: Lovenox Code Status: Full Family Communication:  Status is: Inpatient    Dispo: The patient is from: Home              Anticipated d/c is to: SNF               Anticipated d/c date is: > 3 days              Patient currently is not medically stable to d/c.      Consultants:  Dr. Marin Olp oncology   Procedures/Significant Events:    I have personally reviewed and interpreted all radiology studies and my findings are as above.  VENTILATOR SETTINGS:    Cultures 2/6 influenza A/B  negative 2/6 SARS coronavirus positive    Antimicrobials: Anti-infectives (From admission, onward)    Start     Ordered Stop   04/05/22 2300  nirmatrelvir/ritonavir (PAXLOVID) 3 tablet        04/05/22 1958 04/10/22 0918   04/04/22 1300  azithromycin (ZITHROMAX) 500 mg in sodium chloride 0.9 % 250 mL IVPB        04/04/22 1227 04/06/22 1449   03/29/22 0800  ceFEPIme (MAXIPIME)  2 g in sodium chloride 0.9 % 100 mL IVPB        03/29/22 0727 04/04/22 1846   03/27/22 1000  fluconazole (DIFLUCAN) tablet 100 mg        03/27/22 0701     03/25/22 1000  fluconazole (DIFLUCAN) tablet 200 mg  Status:  Discontinued        03/25/22 0649 03/27/22 0701   03/25/22 1000  famciclovir (FAMVIR) tablet 500 mg        03/25/22 0649     03/23/22 1000  cephALEXin (KEFLEX) capsule 500 mg        03/22/22 1056 03/27/22 2116   03/22/22 0800  ciprofloxacin (CIPRO) tablet 500 mg  Status:  Discontinued        03/22/22 0635 03/22/22 0703   03/22/22 0800  cefTRIAXone (ROCEPHIN) 1 g in sodium chloride 0.9 % 100 mL IVPB  Status:  Discontinued        03/22/22 0703 03/22/22 1056           Devices    LINES / TUBES:      Continuous Infusions:     Objective: Vitals:   04/12/22 0545 04/12/22 1201 04/12/22 1927 04/13/22 0340  BP:  104/63 (!) 110/57 128/71  Pulse:  100 89 80  Resp:  16 17 16  $ Temp:  98.2 F (36.8 C) 98.1 F (36.7 C) 97.9 F (36.6 C)  TempSrc:  Oral Oral Oral  SpO2:  94% 93% 94%  Weight: 61.8 kg     Height:       No intake or output data in the 24 hours ending 04/13/22 1101  Filed Weights   03/17/22 1823 03/27/22 0622 04/12/22 0545   Weight: 69 kg 61.1 kg 61.8 kg   Physical Exam:  General: No acute respiratory distress Eyes: negative scleral hemorrhage, negative anisocoria, negative icterus ENT: Negative Runny nose, negative gingival bleeding, Neck:  Negative scars, masses, torticollis, lymphadenopathy, JVD Lungs: Clear to auscultation bilaterally without wheezes or crackles Cardiovascular: Regular rate and rhythm without murmur gallop or rub normal S1 and S2 Abdomen: negative abdominal pain, nondistended, positive soft, bowel sounds, no rebound, no ascites, no appreciable mass Extremities: No significant cyanosis, clubbing, or edema bilateral lower extremities Skin: Negative rashes, lesions, ulcers Psychiatric:  Negative depression, negative anxiety, negative fatigue, negative mania  Central nervous system:  Cranial nerves II through XII intact, tongue/uvula midline, all extremities muscle strength 5/5, sensation intact throughout, negative dysarthria, negative expressive aphasia, negative receptive aphasia.     .     Data Reviewed: Care during the described time interval was provided by me .  I have reviewed this patient's available data, including medical history, events of note, physical examination, and all test results as part of my evaluation.  CBC: Recent Labs  Lab 04/08/22 0434 04/09/22 1000 04/11/22 0936 04/12/22 0336 04/13/22 0445  WBC 2.0* 1.6* 7.4 7.9 5.5  NEUTROABS 1.3* 1.0* 5.6 5.8 3.7  HGB 8.5* 8.2* 11.6* 11.1* 10.5*  HCT 25.6* 25.3* 34.8* 33.4* 32.2*  MCV 94.1 97.3 90.9 92.0 92.5  PLT 41* 43* 38* 35* 32*    Basic Metabolic Panel: Recent Labs  Lab 04/08/22 0434 04/09/22 0250 04/10/22 0306 04/11/22 0411 04/11/22 0936 04/12/22 0336 04/12/22 1057 04/13/22 0445  NA 133* 135 136 135 136 134*  --  135  K 3.9 4.3 4.3 3.8 3.9 4.1  --  3.9  CL 98 95* 97* 97* 95* 95*  --  95*  CO2 26 28 29 27 28 29  $ --  30  GLUCOSE 104* 146* 127* 133* 141* 166*  --  135*  BUN 16 16 16 17 16 18  $ --   17  CREATININE 0.48 0.54 0.49 0.64 0.58 0.60  --  0.53  CALCIUM 8.5* 8.7* 8.8* 9.0 8.9 9.3  --  9.2  MG 2.1  --  2.2 2.2  --   --  2.5* 2.4  PHOS 3.5 3.2  --  4.0  --   --  3.0 3.5    GFR: Estimated Creatinine Clearance: 66.4 mL/min (by C-G formula based on SCr of 0.53 mg/dL). Liver Function Tests: Recent Labs  Lab 04/10/22 0306 04/11/22 0411 04/11/22 0936 04/12/22 0336 04/13/22 0445  AST 32 39 36 37 37  ALT 62* 61* 54* 54* 51*  ALKPHOS 64 77 70 70 74  BILITOT 0.3 0.6 0.5 0.5 0.5  PROT 6.1* 6.4* 5.9* 5.7* 5.9*  ALBUMIN 2.6* 2.7* 2.8* 2.4* 2.5*    No results for input(s): "LIPASE", "AMYLASE" in the last 168 hours. No results for input(s): "AMMONIA" in the last 168 hours.  Coagulation Profile: Recent Labs  Lab 04/09/22 0250  INR 1.0    Cardiac Enzymes: No results for input(s): "CKTOTAL", "CKMB", "CKMBINDEX", "TROPONINI" in the last 168 hours. BNP (last 3 results) No results for input(s): "PROBNP" in the last 8760 hours. HbA1C: No results for input(s): "HGBA1C" in the last 72 hours. CBG: No results for input(s): "GLUCAP" in the last 168 hours.  Lipid Profile: No results for input(s): "CHOL", "HDL", "LDLCALC", "TRIG", "CHOLHDL", "LDLDIRECT" in the last 72 hours. Thyroid Function Tests: No results for input(s): "TSH", "T4TOTAL", "FREET4", "T3FREE", "THYROIDAB" in the last 72 hours. Anemia Panel: Recent Labs    04/12/22 1057 04/13/22 0445  FERRITIN 2,161* 2,177*   Sepsis Labs: Recent Labs  Lab 04/07/22 0535  PROCALCITON <0.10     Recent Results (from the past 240 hour(s))  Respiratory (~20 pathogens) panel by PCR     Status: None   Collection Time: 04/04/22  3:34 PM   Specimen: Nasopharyngeal Swab; Respiratory  Result Value Ref Range Status   Adenovirus NOT DETECTED NOT DETECTED Final   Coronavirus 229E NOT DETECTED NOT DETECTED Final    Comment: (NOTE) The Coronavirus on the Respiratory Panel, DOES NOT test for the novel  Coronavirus (2019 nCoV)     Coronavirus HKU1 NOT DETECTED NOT DETECTED Final   Coronavirus NL63 NOT DETECTED NOT DETECTED Final   Coronavirus OC43 NOT DETECTED NOT DETECTED Final   Metapneumovirus NOT DETECTED NOT DETECTED Final   Rhinovirus / Enterovirus NOT DETECTED NOT DETECTED Final   Influenza A NOT DETECTED NOT DETECTED Final   Influenza B NOT DETECTED NOT DETECTED Final   Parainfluenza Virus 1 NOT DETECTED NOT DETECTED Final   Parainfluenza Virus 2 NOT DETECTED NOT DETECTED Final   Parainfluenza Virus 3 NOT DETECTED NOT DETECTED Final   Parainfluenza Virus 4 NOT DETECTED NOT DETECTED Final   Respiratory Syncytial Virus NOT DETECTED NOT DETECTED Final   Bordetella pertussis NOT DETECTED NOT DETECTED Final   Bordetella Parapertussis NOT DETECTED NOT DETECTED Final   Chlamydophila pneumoniae NOT DETECTED NOT DETECTED Final   Mycoplasma pneumoniae NOT DETECTED NOT DETECTED Final    Comment: Performed at [facility], [place]. [address] [place], [place] [address]  Resp panel by RT-PCR (RSV, Flu A&B, Covid) Anterior Nasal Swab     Status: Abnormal   Collection Time: 04/04/22  3:34 PM   Specimen: Anterior Nasal Swab  Result Value Ref Range Status  SARS Coronavirus 2 by RT PCR POSITIVE (A) NEGATIVE Final    Comment: (NOTE) SARS-CoV-2 target nucleic acids are DETECTED.  The SARS-CoV-2 RNA is generally detectable in upper respiratory specimens during the acute phase of infection. Positive results are indicative of the presence of the identified virus, but do not rule out bacterial infection or co-infection with other pathogens not detected by the test. Clinical correlation with patient history and other diagnostic information is necessary to determine patient infection status. The expected result is Negative.  Fact Sheet for Patients: EntrepreneurPulse.com.au  Fact Sheet for Healthcare Providers: IncredibleEmployment.be  This test is not yet approved or cleared by the  [place] FDA and  has been authorized for detection and/or diagnosis of SARS-CoV-2 by FDA under an Emergency Use Authorization (EUA).  This EUA will remain in effect (meaning this test can be used) for the duration of  the COVID-19 declaration under Section 564(b)(1) of the A ct, 21 U.S.C. section 360bbb-3(b)(1), unless the authorization is terminated or revoked sooner.     Influenza A by PCR NEGATIVE NEGATIVE Final   Influenza B by PCR NEGATIVE NEGATIVE Final    Comment: (NOTE) The Xpert Xpress SARS-CoV-2/FLU/RSV plus assay is intended as an aid in the diagnosis of influenza from Nasopharyngeal swab specimens and should not be used as a sole basis for treatment. Nasal washings and aspirates are unacceptable for Xpert Xpress SARS-CoV-2/FLU/RSV testing.  Fact Sheet for Patients: EntrepreneurPulse.com.au  Fact Sheet for Healthcare Providers: IncredibleEmployment.be  This test is not yet approved or cleared by the [place] FDA and has been authorized for detection and/or diagnosis of SARS-CoV-2 by FDA under an Emergency Use Authorization (EUA). This EUA will remain in effect (meaning this test can be used) for the duration of the COVID-19 declaration under Section 564(b)(1) of the Act, 21 U.S.C. section 360bbb-3(b)(1), unless the authorization is terminated or revoked.     Resp Syncytial Virus by PCR NEGATIVE NEGATIVE Final    Comment: (NOTE) Fact Sheet for Patients: EntrepreneurPulse.com.au  Fact Sheet for Healthcare Providers: IncredibleEmployment.be  This test is not yet approved or cleared by the [place] FDA and has been authorized for detection and/or diagnosis of SARS-CoV-2 by FDA under an Emergency Use Authorization (EUA). This EUA will remain in effect (meaning this test can be used) for the duration of the COVID-19 declaration under Section 564(b)(1) of the Act, 21 U.S.C. section  360bbb-3(b)(1), unless the authorization is terminated or revoked.  Performed at [facility], [place] [address] [place], [place] [address]          Radiology Studies: No results found.      Scheduled Meds:  antiseptic oral rinse  15 mL Mouth Rinse Q6H   vitamin C  500 mg Oral Daily   benzonatate  100 mg Oral Q4H   Chlorhexidine Gluconate Cloth  6 each Topical Daily   dronabinol  2.5 mg Oral BID AC   famciclovir  500 mg Oral Daily   famotidine  20 mg Oral Daily   feeding supplement  237 mL Oral TID BM   fluconazole  100 mg Oral Daily   furosemide  20 mg Intravenous BID   multivitamin with minerals  1 tablet Oral Daily   naphazoline-glycerin  2 drop Both Eyes Q8H   phosphorus  250 mg Oral TID   sodium bicarbonate/sodium chloride   Mouth Rinse Q4H   sodium chloride flush  10-40 mL Intracatheter Q12H   sodium chloride  1 g Oral Daily  zinc sulfate  220 mg Oral Daily   Continuous Infusions:     LOS: 27 days    Time spent:40 min    Juliett Eastburn, Geraldo Docker, MD [facility]   If 7PM-7AM, please contact night-coverage 04/13/2022, 11:01 AM

## 2022-04-13 NOTE — Progress Notes (Signed)
Ariel [name] feels pretty good.  She is excited about the possibility of leaving and going to [employer].  She did do well with the blood transfusion that she a couple days ago.  Today, her white cell count is 5.5.  Hemoglobin 10.5.  Platelet count 32,000.  I was still bothered by the fact that her platelet count is still on the low side.  Not sure as to why it would still be on the low side.  Her electrolytes seem to be doing pretty well.  Her sodium is 135.  Potassium 3.9.  BUN 17 creatinine 0.53.  Her albumin is 2.5.  Her SGPT is 51.  She says her appetite is doing better.  She is eating better.  She is doing Occupational therapy and Physical therapy.  She is going to the bathroom.  There is no diarrhea.  She has had no bleeding.  There is been no fever.  She says her cough is doing better.  She had a chest x-ray today which showed stability in the changes in the infiltrates in her bases.  Her vital signs show temperature of 97.7.  Pulse 100.  Blood pressure 115/71.  Her head neck exam shows no ocular or oral lesions.  She has no mucositis.  Lungs are clear bilaterally.  She has good air movement bilaterally.  Cardiac exam regular rate and rhythm with no murmurs, rubs or bruits.  Abdomen is soft.  She has good bowel sounds.  There is no fluid wave.  There is no palpable liver or spleen tip.  Neurological exam is nonfocal.  Skin exam shows no petechia or ecchymoses.  Ideally, I would have Ariel [name] come in next week for treatment.  However, I just worry about the plan is to still be on the low side.  Again I am unsure as to why they still would be on the low side.  Hopefully, she will be able to go to [facility].  I know that she is looking forward to this.  I know that she would do well there.  She is incredibly motivated.  I know that her birthday is in 2 days.  It would be nice if she was out of the hospital to enjoy her birthday.

## 2022-04-13 NOTE — Telephone Encounter (Signed)
Ariel [name] a Education officer, museum from [employer] stated pt is being discharged to [facility] and is needing paperwork filled out. They are hoping to get this done as soon as possible so pt can be discharged. Confirmed fax #

## 2022-04-14 ENCOUNTER — Encounter (HOSPITAL_COMMUNITY): Payer: Self-pay | Admitting: Hematology & Oncology

## 2022-04-14 ENCOUNTER — Encounter: Payer: Self-pay | Admitting: Hematology & Oncology

## 2022-04-14 ENCOUNTER — Other Ambulatory Visit (HOSPITAL_COMMUNITY): Payer: Self-pay

## 2022-04-14 ENCOUNTER — Other Ambulatory Visit: Payer: Self-pay

## 2022-04-14 DIAGNOSIS — D72829 Elevated white blood cell count, unspecified: Secondary | ICD-10-CM

## 2022-04-14 DIAGNOSIS — C8589 Other specified types of non-Hodgkin lymphoma, extranodal and solid organ sites: Secondary | ICD-10-CM | POA: Diagnosis not present

## 2022-04-14 DIAGNOSIS — D500 Iron deficiency anemia secondary to blood loss (chronic): Secondary | ICD-10-CM

## 2022-04-14 DIAGNOSIS — D75839 Thrombocytosis, unspecified: Secondary | ICD-10-CM

## 2022-04-14 DIAGNOSIS — D61818 Other pancytopenia: Secondary | ICD-10-CM | POA: Diagnosis not present

## 2022-04-14 DIAGNOSIS — U071 COVID-19: Secondary | ICD-10-CM | POA: Diagnosis not present

## 2022-04-14 LAB — CBC WITH DIFFERENTIAL/PLATELET
Abs Immature Granulocytes: 0.14 10*3/uL — ABNORMAL HIGH (ref 0.00–0.07)
Basophils Absolute: 0 10*3/uL (ref 0.0–0.1)
Basophils Relative: 1 %
Eosinophils Absolute: 0 10*3/uL (ref 0.0–0.5)
Eosinophils Relative: 1 %
HCT: 32.5 % — ABNORMAL LOW (ref 36.0–46.0)
Hemoglobin: 10.3 g/dL — ABNORMAL LOW (ref 12.0–15.0)
Immature Granulocytes: 5 %
Lymphocytes Relative: 17 %
Lymphs Abs: 0.5 10*3/uL — ABNORMAL LOW (ref 0.7–4.0)
MCH: 29.6 pg (ref 26.0–34.0)
MCHC: 31.7 g/dL (ref 30.0–36.0)
MCV: 93.4 fL (ref 80.0–100.0)
Monocytes Absolute: 0.6 10*3/uL (ref 0.1–1.0)
Monocytes Relative: 22 %
Neutro Abs: 1.6 10*3/uL — ABNORMAL LOW (ref 1.7–7.7)
Neutrophils Relative %: 54 %
Platelets: 29 10*3/uL — CL (ref 150–400)
RBC: 3.48 MIL/uL — ABNORMAL LOW (ref 3.87–5.11)
RDW: 14.8 % (ref 11.5–15.5)
WBC: 2.9 10*3/uL — ABNORMAL LOW (ref 4.0–10.5)
nRBC: 0 % (ref 0.0–0.2)

## 2022-04-14 LAB — COMPREHENSIVE METABOLIC PANEL
ALT: 56 U/L — ABNORMAL HIGH (ref 0–44)
AST: 38 U/L (ref 15–41)
Albumin: 2.8 g/dL — ABNORMAL LOW (ref 3.5–5.0)
Alkaline Phosphatase: 72 U/L (ref 38–126)
Anion gap: 9 (ref 5–15)
BUN: 15 mg/dL (ref 8–23)
CO2: 30 mmol/L (ref 22–32)
Calcium: 9 mg/dL (ref 8.9–10.3)
Chloride: 96 mmol/L — ABNORMAL LOW (ref 98–111)
Creatinine, Ser: 0.55 mg/dL (ref 0.44–1.00)
GFR, Estimated: >60 mL/min (ref >60)
Glucose, Bld: 146 mg/dL — ABNORMAL HIGH (ref 70–99)
Potassium: 3.9 mmol/L (ref 3.5–5.1)
Sodium: 135 mmol/L (ref 135–145)
Total Bilirubin: 0.3 mg/dL (ref 0.3–1.2)
Total Protein: 5.4 g/dL — ABNORMAL LOW (ref 6.5–8.1)

## 2022-04-14 LAB — MAGNESIUM: Magnesium: 2.5 mg/dL — ABNORMAL HIGH (ref 1.7–2.4)

## 2022-04-14 LAB — SEDIMENTATION RATE: Sed Rate: 78 mm/hr — ABNORMAL HIGH (ref 0–22)

## 2022-04-14 LAB — D-DIMER, QUANTITATIVE: D-Dimer, Quant: 1.51 ug/mL-FEU — ABNORMAL HIGH (ref 0.00–0.50)

## 2022-04-14 LAB — LACTATE DEHYDROGENASE: LDH: 150 U/L (ref 98–192)

## 2022-04-14 LAB — FERRITIN: Ferritin: 2051 ng/mL — ABNORMAL HIGH (ref 11–307)

## 2022-04-14 LAB — PHOSPHORUS: Phosphorus: 3.5 mg/dL (ref 2.5–4.6)

## 2022-04-14 LAB — C-REACTIVE PROTEIN: CRP: 1.4 mg/dL — ABNORMAL HIGH (ref <1.0)

## 2022-04-14 MED ORDER — SODIUM BICARBONATE/SODIUM CHLORIDE MOUTHWASH: OROMUCOSAL | Status: DC | PRN

## 2022-04-14 MED ORDER — ENSURE ENLIVE PO LIQD
237.0000 mL | Freq: Three times a day (TID) | ORAL | 0 refills | Status: DC
  Filled 2022-04-14: qty 237, 1d supply, fill #0

## 2022-04-14 MED ORDER — K PHOS MONO-SOD PHOS DI & MONO 155-852-130 MG PO TABS
250.0000 mg | ORAL_TABLET | Freq: Three times a day (TID) | ORAL | 0 refills | Status: DC
  Filled 2022-04-14: qty 30, 10d supply, fill #0

## 2022-04-14 MED ORDER — FAMOTIDINE 20 MG PO TABS
20.0000 mg | ORAL_TABLET | Freq: Every day | ORAL | 0 refills | Status: DC
  Filled 2022-04-14: qty 30, 30d supply, fill #0

## 2022-04-14 MED ORDER — ZINC SULFATE 220 (50 ZN) MG PO CAPS
220.0000 mg | ORAL_CAPSULE | Freq: Every day | ORAL | 0 refills | Status: DC
  Filled 2022-04-14: qty 30, 30d supply, fill #0

## 2022-04-14 MED ORDER — NAPHAZOLINE-GLYCERIN 0.012-0.25 % OP SOLN
2.0000 [drp] | Freq: Four times a day (QID) | OPHTHALMIC | 0 refills | Status: DC | PRN
  Filled 2022-04-14: qty 15, 38d supply, fill #0

## 2022-04-14 MED ORDER — FAMCICLOVIR 500 MG PO TABS
500.0000 mg | ORAL_TABLET | Freq: Every day | ORAL | 0 refills | Status: DC
  Filled 2022-04-14: qty 25, 25d supply, fill #0
  Filled 2022-04-14: qty 5, 5d supply, fill #0

## 2022-04-14 MED ORDER — DRONABINOL 2.5 MG PO CAPS: 2.5000 mg | ORAL_CAPSULE | Freq: Two times a day (BID) | ORAL | 0 refills | Status: DC

## 2022-04-14 MED ORDER — ASCORBIC ACID 500 MG PO TABS
500.0000 mg | ORAL_TABLET | Freq: Every day | ORAL | 0 refills | Status: DC
  Filled 2022-04-14: qty 30, 30d supply, fill #0

## 2022-04-14 MED ORDER — HEPARIN SOD (PORK) LOCK FLUSH 100 UNIT/ML IV SOLN
500.0000 [IU] | INTRAVENOUS | Status: AC | PRN
  Administered 2022-04-14: 500 [IU]
  Filled 2022-04-14: qty 5

## 2022-04-14 MED ORDER — ALBUTEROL SULFATE HFA 108 (90 BASE) MCG/ACT IN AERS
2.0000 | INHALATION_SPRAY | Freq: Four times a day (QID) | RESPIRATORY_TRACT | 0 refills | Status: DC | PRN
  Filled 2022-04-14: qty 6.7, 25d supply, fill #0

## 2022-04-14 MED ORDER — BENZONATATE 100 MG PO CAPS
100.0000 mg | ORAL_CAPSULE | ORAL | 0 refills | Status: DC
  Filled 2022-04-14: qty 20, 4d supply, fill #0

## 2022-04-14 MED ORDER — ADULT MULTIVITAMIN W/MINERALS CH: 1.0000 | ORAL_TABLET | Freq: Every day | ORAL | 0 refills | Status: DC

## 2022-04-14 MED ORDER — FLUCONAZOLE 100 MG PO TABS
100.0000 mg | ORAL_TABLET | Freq: Every day | ORAL | 0 refills | Status: DC
  Filled 2022-04-14: qty 30, 30d supply, fill #0

## 2022-04-14 MED ORDER — GUAIFENESIN-DM 100-10 MG/5ML PO SYRP
10.0000 mL | ORAL_SOLUTION | ORAL | 0 refills | Status: DC | PRN
  Filled 2022-04-14: qty 118, 2d supply, fill #0

## 2022-04-14 MED ORDER — SODIUM CHLORIDE 1 G PO TABS
1.0000 g | ORAL_TABLET | Freq: Every day | ORAL | 0 refills | Status: DC
  Filled 2022-04-14: qty 30, 30d supply, fill #0

## 2022-04-14 MED ORDER — NAPHAZOLINE-GLYCERIN 0.012-0.25 % OP SOLN
2.0000 [drp] | Freq: Four times a day (QID) | OPHTHALMIC | Status: DC | PRN
  Administered 2022-04-14: 2 [drp] via OPHTHALMIC

## 2022-04-14 MED ORDER — POLYETHYLENE GLYCOL 3350 17 G PO PACK
17.0000 g | PACK | Freq: Every day | ORAL | 0 refills | Status: DC | PRN
  Filled 2022-04-14: qty 14, 14d supply, fill #0

## 2022-04-14 MED ORDER — BIOTENE DRY MOUTH MT LIQD
15.0000 mL | Freq: Four times a day (QID) | OROMUCOSAL | 0 refills | Status: DC
  Filled 2022-04-14: qty 59, 1d supply, fill #0

## 2022-04-14 NOTE — TOC Transition Note (Signed)
Transition of Care [facility]) - CM/SW Discharge Note   Patient Details  Name: Ariel [name] MRN: [id number] Date of Birth: May 15, 1955  Transition of Care [facility]) CM/SW Contact:  Vassie Moselle, LCSW Phone Number: 04/14/2022, 11:08 AM   Clinical Narrative:    Pt is to transfer to [place]. FL-2 completed with discharge medications and faxed to [facility]. Meds to be delivered to pt's room prior to discharge to ensure she will have needed medications over the weekend.  Pt will be going to room 128. RN to call report to [phone]. Pt's family unable to provide transportation at discharge. PTAR will be arranged for transportation once meds have been delivered.    Final next level of care: Assisted Living Barriers to Discharge: Barriers Resolved   Patient Goals and CMS Choice CMS Medicare.gov Compare Post Acute Care list provided to:: Other (Comment Required) (daughter at bedside) Choice offered to / list presented to : Patient, Adult Children  Discharge Placement                Patient chooses bed at: [facility] Patient to be transferred to facility by: [place] Name of family member notified: Patient and daughter, Magda Paganini Patient and family notified of of transfer: 04/14/22  Discharge Plan and Services Additional resources added to the After Visit Summary for   In-house Referral: NA Discharge Planning Services: CM Consult Post Acute Care Choice: [place]          DME Arranged: N/A DME Agency: NA       HH Arranged: NA HH Agency: NA        Social Determinants of Health (SDOH) Interventions SDOH Screenings   Food Insecurity: No Food Insecurity (03/18/2022)  Housing: Low Risk  (03/18/2022)  Transportation Needs: No Transportation Needs (03/18/2022)  Utilities: Not At Risk (03/18/2022)  Alcohol Screen: Low Risk  (07/08/2020)  Depression (PHQ2-9): Low Risk  (07/13/2021)  Financial Resource Strain: Low Risk  (07/13/2021)  Physical Activity:  Insufficiently Active (07/13/2021)  Social Connections: Socially Isolated (07/13/2021)  Stress: No Stress Concern Present (07/13/2021)  Tobacco Use: Low Risk  (03/17/2022)     Readmission Risk Interventions    04/14/2022   10:59 AM 03/27/2022    3:00 PM 08/02/2021    3:11 PM  Readmission Risk Prevention Plan  Transportation Screening Complete Complete Complete  Medication Review Press photographer) Complete Complete Complete  PCP or Specialist appointment within 3-5 days of discharge Complete Complete Complete  HRI or [place] Complete Complete Complete  SW Recovery Care/Counseling Consult  Complete Complete  Palliative Care Screening  Complete Not [place]  Complete Not Applicable

## 2022-04-14 NOTE — Progress Notes (Signed)
Report called to facility

## 2022-04-14 NOTE — Discharge Summary (Addendum)
Physician Discharge Summary  Ariel [name] [id number] DOB: [birth year] DOA: 03/17/2022  PCP: [name], NP  Admit date: 03/17/2022 Discharge date: 04/14/2022  Time spent: 30 minutes  Recommendations for Outpatient Follow-up:    Positive COVID-19 pneumonia - 2/7 bilateral lower lobe infiltrates persisting a repeat CXR on 2/7 COVID-19 Labs  Recent Labs    04/12/22 1057 04/13/22 0445 04/14/22 0450  DDIMER 2.37* 2.26* 1.51*  FERRITIN 2,161* 2,177* 2,051*  LDH  --  161 150  CRP 1.7* 1.5* 1.4*    Lab Results  Component Value Date   SARSCOV2NAA POSITIVE (A) 04/04/2022   SARSCOV2NAA NEGATIVE 03/17/2022   SARSCOV2NAA POSITIVE (A) 02/21/2022   [place] NEGATIVE 03/07/2021  - Completed Paxlovid -Completed 5-day course of azithromycin -Patient with elevated D-dimer, currently negative SOB, lower extremity pain.  Continue to trend.  May require workup. -2/15 D-dimer trending down along with other COVID markers.    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 Burney Gauze as well as Radiation Oncology/Neuro Oncology - pending transfer to [facility] to initiate chemotherapy - EEG without evidence of epileptiform discharges  -Treatment per Dr. Marin Olp oncology -1/23 chemo begun per oncology. - 2/14 per Dr. Marin Olp oncology wants to start patient on next cycle of chemotherapy.  However concern that platelet count is still too low to begin treatment.  Follow oncology recommendations -2/15 follow-up with Dr. Marin Olp oncology next week per his note for treatment.  SNF will be responsible for transport   FUO -Perhaps a consequence of her lymphoma? - no clear localizing sx presently - urine dipstick + for leukocytes - sent urine for culture - CoViD + Flu + RSV negative at admit - CXR unrevealing at admit  -withhold abx for now as there is no clear source to treat  -2/14 resolved   Aphasia with right-sided weakness -Due to left frontal  lobe CNS lymphoma lesion with worsening edema -continue high-dose steroids -definitive treatment via chemotherapy upon transfer to [facility]  -Resolved with ongoing use of Decadron -2/14 completed course of Decadron -Resolved   Epistaxis - 2/14 resolved   Hyponatremia/SIADH -Likely simply due to poor intake -2/14 NaCl tablets daily - 2/14 mild asymptomatic continue to monitor closely - 2/14 off fluid restriction per patient request   Use of high-dose steroid -Decadron 10 mg daily per oncology  -2/14 treatment course complete   Moderate protein calorie malnutrition - Continue supplements per nutrition.  Leukocytosis -Has fluctuated throughout patient's stay asymptomatic.  Dr. Marin Olp oncology has been following during hospitalization. - Patient to see oncologist next week  Thrombocytosis -Remains low but stable no sign of active bleeding.   Dr. Marin Olp oncology has been following during hospitalization. - Patient to see oncologist next week   Hypokalemia - Potassium goal>4 -2/14 at goal   Hypophosphatemia - Phosphorus goal> 2.5 -2/14 at goal    Discharge Diagnoses:  Principal Problem:   COVID-19 virus infection Active Problems:   CNS lymphoma ([place])   Pancytopenia ([place])   Epistaxis   Protein-calorie malnutrition, moderate (HCC)   SIADH (syndrome of inappropriate ADH production) ([place])   Hypokalemia   Hypophosphatemia   Hyperlipidemia   Melanoma ([place])   High grade B-cell lymphoma (HCC)   Diffuse large B cell lymphoma ([place])   Aphasia   Palliative care by specialist   Leukocytosis   Thrombocytosis, unspecified   Discharge Condition: Stable  Diet recommendation: Regular  Filed Weights   03/17/22 1823 03/27/22 0622 04/12/22 0545  Weight: 69 kg 61.1 kg 61.8  kg    History of present illness:  67 year old WF PMHx Diffuse large cell non-Hodgkin's lymphoma with CNS involvement and melanoma of the skin    Presented to the ED via EMS 03/17/2022 with acute onset of  right-sided facial droop, dysarthria, and aphasia. CT of the head revealed increased edema in the area of her left frontal lobe lesion with no acute hemorrhage or evidence of CVA. MRI of the brain has revealed progression of lymphoma in the left frontal operculum with improvement in the cerebellum.   Hospital Course:  See above  Procedures: 3/15 PCXR:No significant change in heterogeneous left greater than right airspace disease at the lung bases compared to most recent prior.  Consultations: Dr. Marin Olp oncology   Cultures  2/6 influenza A/B  negative 2/6 SARS coronavirus positive 2/9 QuantiFERON gold negative    Antibiotics Anti-infectives (From admission, onward)    Start     Ordered Stop   04/05/22 2300  nirmatrelvir/ritonavir (PAXLOVID) 3 tablet        04/05/22 1958 04/10/22 0918   04/04/22 1300  azithromycin (ZITHROMAX) 500 mg in sodium chloride 0.9 % 250 mL IVPB        04/04/22 1227 04/06/22 1449   03/29/22 0800  ceFEPIme (MAXIPIME) 2 g in sodium chloride 0.9 % 100 mL IVPB        03/29/22 0727 04/04/22 1846   03/27/22 1000  fluconazole (DIFLUCAN) tablet 100 mg        03/27/22 0701     03/25/22 1000  fluconazole (DIFLUCAN) tablet 200 mg  Status:  Discontinued        03/25/22 0649 03/27/22 0701   03/25/22 1000  famciclovir (FAMVIR) tablet 500 mg        03/25/22 0649     03/23/22 1000  cephALEXin (KEFLEX) capsule 500 mg        03/22/22 1056 03/27/22 2116   03/22/22 0800  ciprofloxacin (CIPRO) tablet 500 mg  Status:  Discontinued        03/22/22 0635 03/22/22 0703   03/22/22 0800  cefTRIAXone (ROCEPHIN) 1 g in sodium chloride 0.9 % 100 mL IVPB  Status:  Discontinued        03/22/22 0703 03/22/22 1056         Discharge Exam: Vitals:   04/13/22 0340 04/13/22 1238 04/13/22 1920 04/14/22 0429  BP: 128/71 115/71 106/62 (!) 98/58  Pulse: 80 100 89 94  Resp: 16 18 18 16  $ Temp: 97.9 F (36.6 C) 97.7 F (36.5 C) 97.8 F (36.6 C) 97.9 F (36.6 C)  TempSrc: Oral Oral  Oral Oral  SpO2: 94% 94% 95% 94%  Weight:      Height:        General: No acute respiratory distress Eyes: negative scleral hemorrhage, negative anisocoria, negative icterus ENT: Negative Runny nose, negative gingival bleeding, Neck:  Negative scars, masses, torticollis, lymphadenopathy, JVD Lungs: Clear to auscultation bilaterally without wheezes or crackles Cardiovascular: Regular rate and rhythm without murmur gallop or rub normal S1 and S2  Discharge Instructions   Allergies as of 04/14/2022       Reactions   Decadron [dexamethasone] Other (See Comments)   Leg weakness Hallucinations   Plavix [clopidogrel] Hives, Swelling, Other (See Comments)   Lip swelling   Prednisone Other (See Comments)   Steroids caused psychological issues  Hallucinations   Codeine Nausea And Vomiting        Medication List     TAKE these medications    acetaminophen 325 MG  tablet Commonly known as: TYLENOL Take 1,300 mg by mouth daily as needed for headache, fever, moderate pain or mild pain.   albuterol 108 (90 Base) MCG/ACT inhaler Commonly known as: VENTOLIN HFA Inhale 2 puffs into the lungs every 6 (six) hours as needed for wheezing or shortness of breath.   antiseptic oral rinse Liqd 15 mLs by Mouth Rinse route every 6 (six) hours.   ascorbic acid 500 MG tablet Commonly known as: VITAMIN C Take 1 tablet (500 mg total) by mouth daily. Start taking on: April 15, 2022   benzonatate 100 MG capsule Commonly known as: TESSALON Take 1 capsule (100 mg total) by mouth every 4 (four) hours.   dronabinol 2.5 MG capsule Commonly known as: MARINOL Take 1 capsule (2.5 mg total) by mouth 2 (two) times daily before lunch and supper.   famciclovir 500 MG tablet Commonly known as: FAMVIR Take 1 tablet (500 mg total) by mouth daily. Start taking on: April 15, 2022   famotidine 20 MG tablet Commonly known as: PEPCID Take 1 tablet (20 mg total) by mouth daily. Start taking on:  April 15, 2022   feeding supplement Liqd Take 237 mLs by mouth 3 (three) times daily between meals.   fluconazole 100 MG tablet Commonly known as: DIFLUCAN Take 1 tablet (100 mg total) by mouth daily. Start taking on: April 15, 2022   guaiFENesin-dextromethorphan 100-10 MG/5ML syrup Commonly known as: ROBITUSSIN DM Take 10 mLs by mouth every 4 (four) hours as needed for cough.   ibuprofen 200 MG tablet Commonly known as: ADVIL Take 400 mg by mouth daily as needed.   multivitamin with minerals Tabs tablet Take 1 tablet by mouth daily. Start taking on: April 15, 2022   naphazoline-glycerin 0.012-0.25 % Soln Commonly known as: CLEAR EYES REDNESS Place 2 drops into both eyes 4 (four) times daily as needed for eye irritation.   ondansetron 8 MG tablet Commonly known as: ZOFRAN TAKE 1 TABLET BY MOUTH EVERY 8 HOURS AS NEEDED FOR NAUSEA OR VOMITING What changed:  reasons to take this Another medication with the same name was removed. Continue taking this medication, and follow the directions you see here.   pantoprazole 40 MG tablet Commonly known as: PROTONIX Take 40 mg by mouth every evening.   phosphorus 155-852-130 MG tablet Commonly known as: K PHOS NEUTRAL Take 1 tablet (250 mg total) by mouth 3 (three) times daily.   polyethylene glycol 17 g packet Commonly known as: MIRALAX / GLYCOLAX Take 17 g by mouth daily as needed for mild constipation.   sodium chloride 1 g tablet Take 1 tablet (1 g total) by mouth daily. Start taking on: April 15, 2022   traMADol 50 MG tablet Commonly known as: ULTRAM Take 50 mg by mouth 2 (two) times daily as needed for severe pain.   zinc sulfate 220 (50 Zn) MG capsule Take 1 capsule (220 mg total) by mouth daily. Start taking on: April 15, 2022       Allergies  Allergen Reactions   Decadron [Dexamethasone] Other (See Comments)    Leg weakness Hallucinations   Plavix [Clopidogrel] Hives, Swelling and Other (See  Comments)    Lip swelling    Prednisone Other (See Comments)    Steroids caused psychological issues  Hallucinations   Codeine Nausea And Vomiting      The results of significant diagnostics from this hospitalization (including imaging, microbiology, ancillary and laboratory) are listed below for reference.    Significant Diagnostic Studies: DG  CHEST PORT 1 VIEW  Result Date: 04/13/2022 CLINICAL DATA:  Pneumonia EXAM: PORTABLE CHEST 1 VIEW COMPARISON:  04/09/2022, CT 02/21/2022, chest x-ray 04/05/2022, 04/01/2022 FINDINGS: No significant change in heterogeneous left greater than right airspace opacities compared to most recent prior. No new airspace disease. Stable cardiomediastinal silhouette. Right-sided central venous port tip at the cavoatrial junction. No pneumothorax. IMPRESSION: No significant change in heterogeneous left greater than right airspace disease at the lung bases compared to most recent prior. Electronically Signed   By: Donavan Foil M.D.   On: 04/13/2022 15:13   DG Chest 2 View  Result Date: 04/09/2022 CLINICAL DATA:  COVID, chronic cough. EXAM: CHEST - 2 VIEW COMPARISON:  Chest x-ray 04/05/2022.  Chest CT 02/21/2022. FINDINGS: Right chest port catheter tip projects over the distal SVC. The cardiomediastinal silhouette is within normal limits. There are some minimal strandy and patchy opacities in both lung bases similar to the prior exam. The costophrenic angles are clear. There is no pleural effusion or pneumothorax. No acute fractures are seen. IMPRESSION: Stable strandy and patchy opacities in both lung bases worrisome for infection given the clinical history. Electronically Signed   By: Ronney Asters M.D.   On: 04/09/2022 16:11   DG Chest 2 View  Result Date: 04/05/2022 CLINICAL DATA:  Chronic cough EXAM: CHEST - 2 VIEW COMPARISON:  04/01/2022, CT 02/21/2022, chest x-ray 03/17/2022 FINDINGS: Right-sided central venous port tip over the cavoatrial region. Heterogeneous  airspace disease left greater than right lung base. No pleural effusion or pneumothorax. Stable cardiomediastinal silhouette. IMPRESSION: Heterogeneous airspace disease at the left greater than right lung base, suspicious for pneumonia. Findings may be slightly worse at the left base compared to prior Electronically Signed   By: Donavan Foil M.D.   On: 04/05/2022 15:09   DG Chest 2 View  Result Date: 04/01/2022 CLINICAL DATA:  Cough, weakness, history of CVA, history of lymphoma EXAM: CHEST - 2 VIEW COMPARISON:  03/28/2022 FINDINGS: Frontal and lateral views of the chest demonstrate a stable cardiac silhouette. Right chest wall port via internal jugular approach tip within the region of the superior vena cava. There is patchy bibasilar airspace disease, left greater than right. No effusion or pneumothorax. No acute bony abnormalities. IMPRESSION: 1. Patchy bibasilar pneumonia, left greater than right. Electronically Signed   By: Randa Ngo M.D.   On: 04/01/2022 15:49   DG Chest 2 View  Result Date: 03/28/2022 CLINICAL DATA:  Cough. EXAM: CHEST - 2 VIEW COMPARISON:  March 17, 2022.  March 10, 2022. FINDINGS: The heart size and mediastinal contours are within normal limits. Right internal jugular Port-A-Cath is unchanged in position. Minimal bibasilar subsegmental atelectasis. The visualized skeletal structures are unremarkable. IMPRESSION: Minimal bibasilar subsegmental atelectasis. Electronically Signed   By: Marijo Conception M.D.   On: 03/28/2022 11:36   MR BRAIN W WO CONTRAST  Result Date: 03/17/2022 CLINICAL DATA:  Neuro deficit, acute, stroke suspected EXAM: MRI HEAD WITHOUT AND WITH CONTRAST TECHNIQUE: Multiplanar, multiecho pulse sequences of the brain and surrounding structures were obtained without and with intravenous contrast. CONTRAST:  31m GADAVIST GADOBUTROL 1 MMOL/ML IV SOLN COMPARISON:  MRI 02/13/2022. FINDINGS: Brain: Progressive edema, enhancement and restricted diffusion in the  left frontal operculum, which remains patchy multifocal and is compatible with progressive disease. Left cerebellar peduncle edema and enhancement appears mildly improved. No pathologic enhancement elsewhere. No evidence of midline shift or hydrocephalus. Similar encephalomalacia in the left temporal lobe and inferior left cerebellum. Vascular: Major arterial flow voids  are maintained at the skull base. Skull and upper cervical spine: Normal marrow signal. Sinuses/Orbits: Negative. Other: No mastoid effusions. IMPRESSION: Mixed response with evidence of progressive disease in the left frontal operculum and mild improvement in the left cerebellar peduncle, as above. Both areas continue to enhance. Electronically Signed   By: Margaretha Sheffield M.D.   On: 03/17/2022 18:34   EEG adult  Result Date: 03/17/2022 Lora Havens, MD     03/18/2022  8:01 AM Patient Name: Madelinn Spinler MRN: [id number] Epilepsy Attending: Lora Havens Referring Physician/Provider: Amie Portland, MD Date: 03/17/2022 Duration: 21.08 mins Patient history: 67yo F with aphasia. EEG to evaluate for seizure. Level of alertness: Awake AEDs during EEG study: None Technical aspects: This EEG study was done with scalp electrodes positioned according to the 10-20 International system of electrode placement. Electrical activity was reviewed with band pass filter of 1-70Hz$ , sensitivity of 7 uV/mm, display speed of 68m/sec with a 60Hz$  notched filter applied as appropriate. EEG data were recorded continuously and digitally stored.  Video monitoring was available and reviewed as appropriate. Description: The posterior dominant rhythm consists of 8-9 Hz activity of moderate voltage (25-35 uV) seen predominantly in posterior head regions, symmetric and reactive to eye opening and eye closing. EEG showed intermittent 3 to 5 Hz theta-delta slowing in left frontal region.  Hyperventilation and photic stimulation were not performed.   ABNORMALITY -  Intermittent slow, left frontal region IMPRESSION: This study is suggestive of cortical dysfunction arising from left frontal region likely secondary to underlying structural abnormality. No seizures or epileptiform discharges were seen throughout the recording. Please note lack of epileptiform activity during interictal EEG does not exclude the diagnosis of epilepsy. PLora Havens  DG Chest Portable 1 View  Result Date: 03/17/2022 CLINICAL DATA:  Fever. EXAM: PORTABLE CHEST 1 VIEW COMPARISON:  12/19/2021. FINDINGS: Unchanged right chest Port-A-Cath with tip projecting over the cavoatrial junction. Clear lungs. Normal heart size and mediastinal contours. No pleural effusion or pneumothorax. Visualized bones and upper abdomen are unremarkable. IMPRESSION: No acute cardiopulmonary disease. Electronically Signed   By: WEmmit AlexandersM.D.   On: 03/17/2022 11:52   CT HEAD CODE STROKE WO CONTRAST  Result Date: 03/17/2022 CLINICAL DATA:  Code stroke.  Right-sided facial droop and aphasia EXAM: CT HEAD WITHOUT CONTRAST TECHNIQUE: Contiguous axial images were obtained from the base of the skull through the vertex without intravenous contrast. RADIATION DOSE REDUCTION: This exam was performed according to the departmental dose-optimization program which includes automated exposure control, adjustment of the mA and/or kV according to patient size and/or use of iterative reconstruction technique. COMPARISON:  02/20/2022 FINDINGS: Brain: Increased but fairly mild edema around the high-density lesion at the left frontal cortex, recurrent CNS lymphoma by most recent brain MRI. No CT correlate for enhancing disease at the left deep cerebellum. No evidence of superimposed acute infarct, hemorrhage, or hydrocephalus. Encephalomalacia in the posterior left temporal cortex which is stable. Vascular: No hyperdense vessel or unexpected calcification. Skull: Normal. Negative for fracture or focal lesion. Sinuses/Orbits: No  acute finding. Other: Prelim sent in epic chat IMPRESSION: 1. Increased edema at the left frontal lesion. A deep left cerebellar lesion on prior brain MRI is largely occult by CT. 2. No acute hemorrhage or visible infarct. 3. Stable left temporal lobe encephalomalacia. Electronically Signed   By: JJorje GuildM.D.   On: 03/17/2022 10:44    Microbiology: Recent Results (from the past 240 hour(s))  Respiratory (~20 pathogens) panel by PCR  Status: None   Collection Time: 04/04/22  3:34 PM   Specimen: Nasopharyngeal Swab; Respiratory  Result Value Ref Range Status   Adenovirus NOT DETECTED NOT DETECTED Final   Coronavirus 229E NOT DETECTED NOT DETECTED Final    Comment: (NOTE) The Coronavirus on the Respiratory Panel, DOES NOT test for the novel  Coronavirus (2019 nCoV)    Coronavirus HKU1 NOT DETECTED NOT DETECTED Final   Coronavirus NL63 NOT DETECTED NOT DETECTED Final   Coronavirus OC43 NOT DETECTED NOT DETECTED Final   Metapneumovirus NOT DETECTED NOT DETECTED Final   Rhinovirus / Enterovirus NOT DETECTED NOT DETECTED Final   Influenza A NOT DETECTED NOT DETECTED Final   Influenza B NOT DETECTED NOT DETECTED Final   Parainfluenza Virus 1 NOT DETECTED NOT DETECTED Final   Parainfluenza Virus 2 NOT DETECTED NOT DETECTED Final   Parainfluenza Virus 3 NOT DETECTED NOT DETECTED Final   Parainfluenza Virus 4 NOT DETECTED NOT DETECTED Final   Respiratory Syncytial Virus NOT DETECTED NOT DETECTED Final   Bordetella pertussis NOT DETECTED NOT DETECTED Final   Bordetella Parapertussis NOT DETECTED NOT DETECTED Final   Chlamydophila pneumoniae NOT DETECTED NOT DETECTED Final   Mycoplasma pneumoniae NOT DETECTED NOT DETECTED Final    Comment: Performed at [facility], [address] [place], [place] [address]  Resp panel by RT-PCR (RSV, Flu A&B, Covid) Anterior Nasal Swab     Status: Abnormal   Collection Time: 04/04/22  3:34 PM   Specimen: Anterior Nasal Swab  Result Value Ref  Range Status   SARS Coronavirus 2 by RT PCR POSITIVE (A) NEGATIVE Final    Comment: (NOTE) SARS-CoV-2 target nucleic acids are DETECTED.  The SARS-CoV-2 RNA is generally detectable in upper respiratory specimens during the acute phase of infection. Positive results are indicative of the presence of the identified virus, but do not rule out bacterial infection or co-infection with other pathogens not detected by the test. Clinical correlation with patient history and other diagnostic information is necessary to determine patient infection status. The expected result is Negative.  Fact Sheet for Patients: EntrepreneurPulse.com.au  Fact Sheet for Healthcare Providers: IncredibleEmployment.be  This test is not yet approved or cleared by the [place] FDA and  has been authorized for detection and/or diagnosis of SARS-CoV-2 by FDA under an Emergency Use Authorization (EUA).  This EUA will remain in effect (meaning this test can be used) for the duration of  the COVID-19 declaration under Section 564(b)(1) of the A ct, 21 U.S.C. section 360bbb-3(b)(1), unless the authorization is terminated or revoked sooner.     Influenza A by PCR NEGATIVE NEGATIVE Final   Influenza B by PCR NEGATIVE NEGATIVE Final    Comment: (NOTE) The Xpert Xpress SARS-CoV-2/FLU/RSV plus assay is intended as an aid in the diagnosis of influenza from Nasopharyngeal swab specimens and should not be used as a sole basis for treatment. Nasal washings and aspirates are unacceptable for Xpert Xpress SARS-CoV-2/FLU/RSV testing.  Fact Sheet for Patients: EntrepreneurPulse.com.au  Fact Sheet for Healthcare Providers: IncredibleEmployment.be  This test is not yet approved or cleared by the [place] FDA and has been authorized for detection and/or diagnosis of SARS-CoV-2 by FDA under an Emergency Use Authorization (EUA). This EUA will  remain in effect (meaning this test can be used) for the duration of the COVID-19 declaration under Section 564(b)(1) of the Act, 21 U.S.C. section 360bbb-3(b)(1), unless the authorization is terminated or revoked.     Resp Syncytial Virus by PCR NEGATIVE  NEGATIVE Final    Comment: (NOTE) Fact Sheet for Patients: EntrepreneurPulse.com.au  Fact Sheet for Healthcare Providers: IncredibleEmployment.be  This test is not yet approved or cleared by the [place] FDA and has been authorized for detection and/or diagnosis of SARS-CoV-2 by FDA under an Emergency Use Authorization (EUA). This EUA will remain in effect (meaning this test can be used) for the duration of the COVID-19 declaration under Section 564(b)(1) of the Act, 21 U.S.C. section 360bbb-3(b)(1), unless the authorization is terminated or revoked.  Performed at [facility], [place] [address], [place], [place] [address]      Labs: Basic Metabolic Panel: Recent Labs  Lab 04/09/22 0250 04/10/22 0306 04/11/22 0411 04/11/22 0936 04/12/22 0336 04/12/22 1057 04/13/22 0445 04/14/22 0450  NA 135 136 135 136 134*  --  135 135  K 4.3 4.3 3.8 3.9 4.1  --  3.9 3.9  CL 95* 97* 97* 95* 95*  --  95* 96*  CO2 28 29 27 28 29  $ --  30 30  GLUCOSE 146* 127* 133* 141* 166*  --  135* 146*  BUN 16 16 17 16 18  $ --  17 15  CREATININE 0.54 0.49 0.64 0.58 0.60  --  0.53 0.55  CALCIUM 8.7* 8.8* 9.0 8.9 9.3  --  9.2 9.0  MG  --  2.2 2.2  --   --  2.5* 2.4 2.5*  PHOS 3.2  --  4.0  --   --  3.0 3.5 3.5   Liver Function Tests: Recent Labs  Lab 04/11/22 0411 04/11/22 0936 04/12/22 0336 04/13/22 0445 04/14/22 0450  AST 39 36 37 37 38  ALT 61* 54* 54* 51* 56*  ALKPHOS 77 70 70 74 72  BILITOT 0.6 0.5 0.5 0.5 0.3  PROT 6.4* 5.9* 5.7* 5.9* 5.4*  ALBUMIN 2.7* 2.8* 2.4* 2.5* 2.8*   No results for input(s): "LIPASE", "AMYLASE" in the last 168 hours. No results for input(s):  "AMMONIA" in the last 168 hours. CBC: Recent Labs  Lab 04/09/22 1000 04/11/22 0936 04/12/22 0336 04/13/22 0445 04/14/22 0450  WBC 1.6* 7.4 7.9 5.5 2.9*  NEUTROABS 1.0* 5.6 5.8 3.7 1.6*  HGB 8.2* 11.6* 11.1* 10.5* 10.3*  HCT 25.3* 34.8* 33.4* 32.2* 32.5*  MCV 97.3 90.9 92.0 92.5 93.4  PLT 43* 38* 35* 32* 29*   Cardiac Enzymes: No results for input(s): "CKTOTAL", "CKMB", "CKMBINDEX", "TROPONINI" in the last 168 hours. BNP: BNP (last 3 results) Recent Labs    04/07/22 0535  BNP 69.0    ProBNP (last 3 results) No results for input(s): "PROBNP" in the last 8760 hours.  CBG: No results for input(s): "GLUCAP" in the last 168 hours.     Signed:  Dia Crawford, MD [facility]

## 2022-04-14 NOTE — Telephone Encounter (Signed)
Form was received but provider, reports she spoke to social worker at hospital and form will be filled out by hospitalist.

## 2022-04-17 LAB — QUANTIFERON-TB GOLD PLUS (RQFGPL)
QuantiFERON Mitogen Value: 1.75 IU/mL
QuantiFERON Nil Value: 0.04 IU/mL
QuantiFERON TB1 Ag Value: 0.05 IU/mL
QuantiFERON TB2 Ag Value: 0.07 IU/mL

## 2022-04-17 LAB — QUANTIFERON-TB GOLD PLUS: QuantiFERON-TB Gold Plus: NEGATIVE

## 2022-04-18 ENCOUNTER — Other Ambulatory Visit: Payer: Self-pay

## 2022-04-18 ENCOUNTER — Inpatient Hospital Stay: Payer: Medicare Other | Attending: Internal Medicine

## 2022-04-18 ENCOUNTER — Inpatient Hospital Stay: Payer: Medicare Other

## 2022-04-18 ENCOUNTER — Encounter: Payer: Self-pay | Admitting: *Deleted

## 2022-04-18 ENCOUNTER — Encounter: Payer: Self-pay | Admitting: Hematology & Oncology

## 2022-04-18 ENCOUNTER — Inpatient Hospital Stay: Payer: Medicare Other | Admitting: Hematology & Oncology

## 2022-04-18 VITALS — BP 130/59 | HR 87 | Temp 98.2°F | Resp 16 | Ht 67.0 in | Wt 140.0 lb

## 2022-04-18 DIAGNOSIS — C8510 Unspecified B-cell lymphoma, unspecified site: Secondary | ICD-10-CM

## 2022-04-18 DIAGNOSIS — C8339 Diffuse large B-cell lymphoma, extranodal and solid organ sites: Secondary | ICD-10-CM | POA: Insufficient documentation

## 2022-04-18 DIAGNOSIS — Z95828 Presence of other vascular implants and grafts: Secondary | ICD-10-CM

## 2022-04-18 DIAGNOSIS — Z9221 Personal history of antineoplastic chemotherapy: Secondary | ICD-10-CM | POA: Diagnosis not present

## 2022-04-18 LAB — CBC WITH DIFFERENTIAL (CANCER CENTER ONLY)
Abs Immature Granulocytes: 0.16 10*3/uL — ABNORMAL HIGH (ref 0.00–0.07)
Basophils Absolute: 0 10*3/uL (ref 0.0–0.1)
Basophils Relative: 0 %
Eosinophils Absolute: 0 10*3/uL (ref 0.0–0.5)
Eosinophils Relative: 1 %
HCT: 31.7 % — ABNORMAL LOW (ref 36.0–46.0)
Hemoglobin: 10.7 g/dL — ABNORMAL LOW (ref 12.0–15.0)
Immature Granulocytes: 5 %
Lymphocytes Relative: 22 %
Lymphs Abs: 0.7 10*3/uL (ref 0.7–4.0)
MCH: 30.6 pg (ref 26.0–34.0)
MCHC: 33.8 g/dL (ref 30.0–36.0)
MCV: 90.6 fL (ref 80.0–100.0)
Monocytes Absolute: 0.7 10*3/uL (ref 0.1–1.0)
Monocytes Relative: 22 %
Neutro Abs: 1.5 10*3/uL — ABNORMAL LOW (ref 1.7–7.7)
Neutrophils Relative %: 50 %
Platelet Count: 35 10*3/uL — ABNORMAL LOW (ref 150–400)
RBC: 3.5 MIL/uL — ABNORMAL LOW (ref 3.87–5.11)
RDW: 15.3 % (ref 11.5–15.5)
WBC Count: 3.1 10*3/uL — ABNORMAL LOW (ref 4.0–10.5)
nRBC: 0 % (ref 0.0–0.2)

## 2022-04-18 LAB — CMP (CANCER CENTER ONLY)
ALT: 54 U/L — ABNORMAL HIGH (ref 0–44)
AST: 25 U/L (ref 15–41)
Albumin: 4.1 g/dL (ref 3.5–5.0)
Alkaline Phosphatase: 80 U/L (ref 38–126)
Anion gap: 8 (ref 5–15)
BUN: 13 mg/dL (ref 8–23)
CO2: 29 mmol/L (ref 22–32)
Calcium: 9.8 mg/dL (ref 8.9–10.3)
Chloride: 101 mmol/L (ref 98–111)
Creatinine: 0.63 mg/dL (ref 0.44–1.00)
GFR, Estimated: >60 mL/min (ref >60)
Glucose, Bld: 113 mg/dL — ABNORMAL HIGH (ref 70–99)
Potassium: 3.9 mmol/L (ref 3.5–5.1)
Sodium: 138 mmol/L (ref 135–145)
Total Bilirubin: 0.4 mg/dL (ref 0.3–1.2)
Total Protein: 6.3 g/dL — ABNORMAL LOW (ref 6.5–8.1)

## 2022-04-18 LAB — SAVE SMEAR(SSMR), FOR PROVIDER SLIDE REVIEW

## 2022-04-18 LAB — SAMPLE TO BLOOD BANK

## 2022-04-18 LAB — LACTATE DEHYDROGENASE: LDH: 241 U/L — ABNORMAL HIGH (ref 98–192)

## 2022-04-18 MED ORDER — HEPARIN SOD (PORK) LOCK FLUSH 100 UNIT/ML IV SOLN
500.0000 [IU] | Freq: Once | INTRAVENOUS | Status: AC
  Administered 2022-04-18: 500 [IU] via INTRAVENOUS

## 2022-04-18 MED ORDER — SODIUM CHLORIDE 0.9% FLUSH
10.0000 mL | Freq: Once | INTRAVENOUS | Status: AC
  Administered 2022-04-18: 10 mL via INTRAVENOUS

## 2022-04-18 NOTE — Patient Instructions (Signed)

## 2022-04-18 NOTE — Progress Notes (Signed)
Patient is here for post discharge follow up. She is not ready for her next cycle of treatment. She will need an MRI and BMBx prior to consideration of treatment.   Oncology Nurse Navigator Documentation     04/18/2022    2:30 PM  Oncology Nurse Navigator Flowsheets  Navigator Follow Up Date: 04/25/2022  Navigator Follow Up Reason: Scan Review  Navigator Location [facility]  Navigator Encounter Type Appt/Treatment Plan Review  Patient Visit Type MedOnc  Treatment Phase Active Tx  Barriers/Navigation Needs No Barriers At This Time  Interventions None Required  Acuity Level 1-No Barriers  Support Groups/Services Friends and Family  Time Spent with Patient 15

## 2022-04-18 NOTE — Progress Notes (Signed)
Hematology and Oncology Follow Up Visit  Ariel [name] [id number] May 10, 1955 67 y.o. 04/18/2022   Principle Diagnosis:  Diffuse large cell non-Hodgkin's lymphoma-CNS involvement -- relapsed  Current Therapy:   Status post chemotherapy with R-ICE/R-MTV --  s/p cycle #4 MATRIX -- s/p cycle #4 -- start on 05/02/2021 --completed on 08/06/2021 Neulasta 6 mg subcu post chemotherapy MTV- s/p cycle #1 -- given on 01/22/204     Interim History:  [name] is comes in for follow-up.  She had a fairly lengthy hospital stay after her first cycle of MTV for the relapse of her lymphoma.  She has Hodgkin's lymphoma.  The relapse was in her brain.  She has she was admitted over to [facility] that we moved over to [facility] for chemotherapy.  The problem now that she has significant thrombocytopenia.  I am not sure exactly as to why she has the thrombocytopenia.  Unfortunately, I think we will going to have to do a bone marrow biopsy on her to see exactly what is going on.  She is at assisted living right now.  She is enjoying this.  She had a wonderful 2 birthday over the weekend.  She is having no headaches.  There is no visual changes.  She is having no pain.  There is no bleeding.  There is no change in bowel or bladder habits.  Her appetite has been doing fairly well.  She has had no nausea or vomiting.   She has had no fever.  She had a cough in the hospital.  This is resolved.  She also had COVID while she was in the hospital.  Again I am not sure how she could have gotten COVID but she was treated for this with Paxlovid.  Currently, I would have said that her performance status is probably ECOG 1.  Medications:  Current Outpatient Medications:    acetaminophen (TYLENOL) 325 MG tablet, Take 1,300 mg by mouth daily as needed for headache, fever, moderate pain or mild pain., Disp: , Rfl:    benzonatate (TESSALON) 100 MG capsule, Take 1 capsule (100 mg total) by mouth every 4  (four) hours., Disp: 20 capsule, Rfl: 0   feeding supplement (ENSURE ENLIVE / ENSURE PLUS) LIQD, Take 237 mLs by mouth 3 (three) times daily between meals., Disp: 237 mL, Rfl: 0   naphazoline-glycerin (CLEAR EYES REDNESS) 0.012-0.25 % SOLN, Place 2 drops into both eyes 4 (four) times daily as needed for eye irritation., Disp: 15 mL, Rfl: 0   polyethylene glycol (MIRALAX / GLYCOLAX) 17 g packet, Take 17 g by mouth daily as needed for mild constipation., Disp: 14 each, Rfl: 0 No current facility-administered medications for this visit.  Facility-Administered Medications Ordered in Other Visits:    sodium chloride flush (NS) 0.9 % injection 10 mL, 10 mL, Intravenous, PRN, Volanda Napoleon, MD, 10 mL at 10/04/21 0946  Allergies:  Allergies  Allergen Reactions   Decadron [Dexamethasone] Other (See Comments)    Leg weakness Hallucinations   Plavix [Clopidogrel] Hives, Swelling and Other (See Comments)    Lip swelling    Prednisone Other (See Comments)    Steroids caused psychological issues  Hallucinations   Codeine Nausea And Vomiting    Past Medical History, Surgical history, Social history, and Family History were reviewed and updated.  Review of Systems: Review of Systems  Constitutional: Negative.   HENT:  Negative.    Eyes: Negative.   Respiratory: Negative.    Cardiovascular: Negative.  Gastrointestinal: Negative.   Endocrine: Negative.   Genitourinary: Negative.    Musculoskeletal: Negative.   Skin: Negative.   Neurological: Negative.   Hematological: Negative.   Psychiatric/Behavioral: Negative.      Physical Exam:  height is 5' 7"$  (1.702 m) and weight is 140 lb (63.5 kg). Her oral temperature is 98.2 F (36.8 C). Her blood pressure is 130/59 (abnormal) and her pulse is 87. Her respiration is 16 and oxygen saturation is 100%.   Wt Readings from Last 3 Encounters:  04/18/22 140 lb (63.5 kg)  04/12/22 136 lb 3.9 oz (61.8 kg)  03/16/22 141 lb 9.6 oz (64.2 kg)     Physical Exam Vitals reviewed.  HENT:     Head: Normocephalic and atraumatic.  Eyes:     Pupils: Pupils are equal, round, and reactive to light.  Cardiovascular:     Rate and Rhythm: Normal rate and regular rhythm.     Heart sounds: Normal heart sounds.     Comments: Cardiac exam is regular rate and rhythm with no murmurs, rubs or bruits. Pulmonary:     Effort: Pulmonary effort is normal.     Breath sounds: Normal breath sounds.  Abdominal:     General: Bowel sounds are normal.     Palpations: Abdomen is soft.  Musculoskeletal:        General: No tenderness or deformity. Normal range of motion.     Cervical back: Normal range of motion.     Comments: .  Lymphadenopathy:     Cervical: No cervical adenopathy.  Skin:    General: Skin is warm and dry.     Findings: No erythema or rash.  Neurological:     Mental Status: She is alert and oriented to person, place, and time.     Comments: Neurological exam shows no focal neurological deficits.  All cranial nerves are intact.  She has good strength in upper and lower extremities.  Psychiatric:        Behavior: Behavior normal.        Thought Content: Thought content normal.        Judgment: Judgment normal.      Lab Results  Component Value Date   WBC 3.1 (L) 04/18/2022   HGB 10.7 (L) 04/18/2022   HCT 31.7 (L) 04/18/2022   MCV 90.6 04/18/2022   PLT 35 (L) 04/18/2022     Chemistry      Component Value Date/Time   NA 138 04/18/2022 1155   K 3.9 04/18/2022 1155   CL 101 04/18/2022 1155   CO2 29 04/18/2022 1155   BUN 13 04/18/2022 1155   CREATININE 0.63 04/18/2022 1155   CREATININE 0.68 11/18/2012 1439      Component Value Date/Time   CALCIUM 9.8 04/18/2022 1155   ALKPHOS 80 04/18/2022 1155   AST 25 04/18/2022 1155   ALT 54 (H) 04/18/2022 1155   BILITOT 0.4 04/18/2022 1155      Impression and Plan: [name] is a very charming 67 year old white female.  She initially came to [place] with iron deficiency anemia.   We subsequently found that she had diffuse large cell non-Hodgkin's lymphoma.  It had traveled to her brain.  She has had a total of 8 cycles of treatment.  She had 4 cycles of a combination protocol.  We then gave her 4 cycles of MATRIX.  She now has the recurrence.  We had her in the hospital with high-dose methotrexate based therapy.  Again the thrombocytopenia is quite  troublesome.  We will have to set up with a bone marrow biopsy to see what is going on.  Also will have to get a MRI of the brain to see that she is responding.  Hopefully, she is responding since there is no neurological symptoms and no headache.  If we cannot do any further chemotherapy because of her thrombocytopenia, I think the other option would be radiation therapy.  I have inquired about CAR-T therapy.  Unfortunately, it might be difficult to get this for her secondary to the fact that her relapse is only in the brain.  It is possible that we do a bone marrow biopsy that we will find that she may have disease in her bone marrow.  We will have to get her back when she has her bone marrow test done and her MRI.  I know that she is doing very well at the assisted living.  She certainly is getting good care over there.   Volanda Napoleon, MD 2/20/20241:09 PM

## 2022-04-19 ENCOUNTER — Other Ambulatory Visit: Payer: Self-pay

## 2022-04-20 ENCOUNTER — Telehealth: Payer: Self-pay | Admitting: Dietician

## 2022-04-20 ENCOUNTER — Encounter: Payer: Medicare Other | Admitting: Dietician

## 2022-04-20 ENCOUNTER — Ambulatory Visit: Payer: Medicare Other | Admitting: Dietician

## 2022-04-20 ENCOUNTER — Other Ambulatory Visit: Payer: Self-pay

## 2022-04-20 NOTE — Telephone Encounter (Signed)
Attempted to reach patient for a scheduled remote nutrition consult. Provided my cell# on voice mail to return call for follow up nutrition consult from Malnutrition dx of IP RD.  Also sent text requesting patient return call or let me know best time to reach.  April Manson, RDN, LDN Registered Dietitian, [facility] Part Time Remote (Usual office hours: Tuesday-Thursday) Cell: [phone]

## 2022-04-20 NOTE — Progress Notes (Signed)
Nutrition Follow-up:  Patient return call/text from earlier.  She reports since getting out of the hospital her appetite has improved.  She lives in an assisted living facility that prepares meals for her.  She has been going to the dining room for 3 meals a day not skipping.  She has little choice is meals and what is offer.  Dining room offers meat vegetable salad or soup. She does report poor appetite and that her portions are not what they used to be.  She is doing some cardi and weight bearing exercises to restore her strength and increase her appetite.   She has been drinking one Ensure product a day could tell me which one.  She desires to regain weight but she doesn't;t think she can increase snacking much to boost intake she fears it will decrease what she's able to eat during meals.  Medications: reviewed  Labs: 04/18/22  Hgb 10.7 improving  Anthropometrics: weight has increased since discharge  Height: 67" Weight: 140# UBW: 150 BMI: 21.93    MALNUTRITION DIAGNOSIS: Non-severe (moderate) malnutrition in context of chronic illness   Intervention:  Relayed that nutrition services are wrap around service provided at no charge and encouraged continued communication if experiencing continued weight loss or any nutritional impact symptoms (NIS).  Educated on importance of adequate calorie and protein energy intake  with nutrient dense foods when possible to maintain weight/strength and increase LBM.  Discussed ways to add calories/protein to foods (asking for extra butter, creamy sauces/gravy) -she didn't think she' tolerate too much extra gravies and sauces.  Encouraged small frequent feeds and high protein snacking between meals (protein bars, peanut butter and crackers, cheese stick) Suggested adding another carton of her oral nutrition supplement (BID)  Offered High Protein High Calorie Snacking tips sheet but she didn't respond.  She has contact information encouraged her to  reach out if she wasn't meeting goal of gradual weight gain.   MONITORING, EVALUATION, GOAL: weight, PO intake, Nutrition Impact Symptoms, labs Goal is weight maintenance  Next Visit: PRN at patient or provider request  April Manson, RDN, LDN Registered Dietitian, [place] Part Time Remote (Usual office hours: Tuesday-Thursday) Cell: [phone]

## 2022-04-21 ENCOUNTER — Other Ambulatory Visit: Payer: Self-pay | Admitting: *Deleted

## 2022-04-21 ENCOUNTER — Other Ambulatory Visit: Payer: Self-pay

## 2022-04-21 ENCOUNTER — Telehealth: Payer: Self-pay

## 2022-04-21 ENCOUNTER — Other Ambulatory Visit (HOSPITAL_COMMUNITY): Payer: Self-pay

## 2022-04-21 DIAGNOSIS — C8589 Other specified types of non-Hodgkin lymphoma, extranodal and solid organ sites: Secondary | ICD-10-CM

## 2022-04-21 DIAGNOSIS — D500 Iron deficiency anemia secondary to blood loss (chronic): Secondary | ICD-10-CM

## 2022-04-21 DIAGNOSIS — C8333 Diffuse large B-cell lymphoma, intra-abdominal lymph nodes: Secondary | ICD-10-CM

## 2022-04-21 DIAGNOSIS — C8331 Diffuse large B-cell lymphoma, lymph nodes of head, face, and neck: Secondary | ICD-10-CM

## 2022-04-21 MED ORDER — DRONABINOL 2.5 MG PO CAPS: 2.5000 mg | ORAL_CAPSULE | Freq: Two times a day (BID) | ORAL | 2 refills | Status: DC

## 2022-04-21 NOTE — Telephone Encounter (Signed)
Confirmed with Dr Marin Olp that pt should be taking Famivir 500, Pepcid 20, Fluconazole 100, multivitamin and Zofran 8. Magda Paganini advised and Iman from [employer]. Magda Paganini will advise pt of the continuation of these medications. All meds precribed from our office will be faxed to site for verification of use.

## 2022-04-21 NOTE — Telephone Encounter (Signed)
Lovena Le received a call from [place] the assisted living [facility] from [facility] stating that Taleesha is refusing to take any of her medications and she is stating that Dr Marin Olp told her not to. Spoke with Dr Marin Olp and he said that he did not state this and that she does need to be taking her medications. Dr Marin Olp stated this is not a good sign because she has brain mets and he wants her to go to the ER for an evaluation and MRI of her brain. Iman advised and states she will contact pt's family and inform them of this as well.   Brayah called in stating that at her last apt on 04/18/22 she removed a lot of the medications from her medlist that she was not taking. OV notes do show removal of medications. Olivette states she is feeling fine and has already been up and showered this morning. Contacted Iman to verify medication list and had to [facility].

## 2022-04-22 ENCOUNTER — Other Ambulatory Visit: Payer: Self-pay

## 2022-04-24 ENCOUNTER — Encounter: Payer: Self-pay | Admitting: *Deleted

## 2022-04-24 NOTE — Progress Notes (Signed)
Dronabinol 2.5 mg cap approved by [employer] through 10/23/22.

## 2022-04-25 ENCOUNTER — Ambulatory Visit (HOSPITAL_COMMUNITY)
Admission: RE | Admit: 2022-04-25 | Discharge: 2022-04-25 | Disposition: A | Discharge status: Home / Self Care | Payer: Medicare Other | Source: Ambulatory Visit | Attending: Hematology & Oncology | Admitting: Hematology & Oncology

## 2022-04-25 DIAGNOSIS — G9389 Other specified disorders of brain: Secondary | ICD-10-CM | POA: Diagnosis not present

## 2022-04-25 DIAGNOSIS — C8510 Unspecified B-cell lymphoma, unspecified site: Secondary | ICD-10-CM | POA: Diagnosis not present

## 2022-04-25 MED ORDER — GADOBUTROL 1 MMOL/ML IV SOLN
6.0000 mL | Freq: Once | INTRAVENOUS | Status: AC | PRN
  Administered 2022-04-25: 6 mL via INTRAVENOUS

## 2022-04-26 ENCOUNTER — Inpatient Hospital Stay: Payer: Medicare Other

## 2022-04-26 ENCOUNTER — Encounter: Payer: Self-pay | Admitting: Hematology & Oncology

## 2022-04-26 ENCOUNTER — Other Ambulatory Visit: Payer: Self-pay

## 2022-04-26 ENCOUNTER — Inpatient Hospital Stay: Payer: Medicare Other | Admitting: Hematology & Oncology

## 2022-04-26 ENCOUNTER — Encounter: Payer: Self-pay | Admitting: *Deleted

## 2022-04-26 VITALS — BP 117/51 | HR 90 | Temp 98.2°F | Resp 18 | Ht 67.0 in | Wt 143.0 lb

## 2022-04-26 DIAGNOSIS — C8510 Unspecified B-cell lymphoma, unspecified site: Secondary | ICD-10-CM

## 2022-04-26 DIAGNOSIS — C8332 Diffuse large B-cell lymphoma, intrathoracic lymph nodes: Secondary | ICD-10-CM | POA: Diagnosis not present

## 2022-04-26 DIAGNOSIS — C8339 Diffuse large B-cell lymphoma, extranodal and solid organ sites: Secondary | ICD-10-CM | POA: Diagnosis not present

## 2022-04-26 DIAGNOSIS — Z95828 Presence of other vascular implants and grafts: Secondary | ICD-10-CM

## 2022-04-26 DIAGNOSIS — Z9221 Personal history of antineoplastic chemotherapy: Secondary | ICD-10-CM | POA: Diagnosis not present

## 2022-04-26 LAB — CMP (CANCER CENTER ONLY)
ALT: 28 U/L (ref 0–44)
AST: 22 U/L (ref 15–41)
Albumin: 4.4 g/dL (ref 3.5–5.0)
Alkaline Phosphatase: 62 U/L (ref 38–126)
Anion gap: 11 (ref 5–15)
BUN: 10 mg/dL (ref 8–23)
CO2: 27 mmol/L (ref 22–32)
Calcium: 10 mg/dL (ref 8.9–10.3)
Chloride: 100 mmol/L (ref 98–111)
Creatinine: 0.62 mg/dL (ref 0.44–1.00)
GFR, Estimated: >60 mL/min (ref >60)
Glucose, Bld: 142 mg/dL — ABNORMAL HIGH (ref 70–99)
Potassium: 4 mmol/L (ref 3.5–5.1)
Sodium: 138 mmol/L (ref 135–145)
Total Bilirubin: 0.6 mg/dL (ref 0.3–1.2)
Total Protein: 6.3 g/dL — ABNORMAL LOW (ref 6.5–8.1)

## 2022-04-26 LAB — CBC WITH DIFFERENTIAL (CANCER CENTER ONLY)
Abs Immature Granulocytes: 0.01 10*3/uL (ref 0.00–0.07)
Basophils Absolute: 0 10*3/uL (ref 0.0–0.1)
Basophils Relative: 0 %
Eosinophils Absolute: 0 10*3/uL (ref 0.0–0.5)
Eosinophils Relative: 1 %
HCT: 32.6 % — ABNORMAL LOW (ref 36.0–46.0)
Hemoglobin: 10.9 g/dL — ABNORMAL LOW (ref 12.0–15.0)
Immature Granulocytes: 0 %
Lymphocytes Relative: 36 %
Lymphs Abs: 0.9 10*3/uL (ref 0.7–4.0)
MCH: 31.1 pg (ref 26.0–34.0)
MCHC: 33.4 g/dL (ref 30.0–36.0)
MCV: 92.9 fL (ref 80.0–100.0)
Monocytes Absolute: 0.5 10*3/uL (ref 0.1–1.0)
Monocytes Relative: 22 %
Neutro Abs: 1 10*3/uL — ABNORMAL LOW (ref 1.7–7.7)
Neutrophils Relative %: 41 %
Platelet Count: 59 10*3/uL — ABNORMAL LOW (ref 150–400)
RBC: 3.51 MIL/uL — ABNORMAL LOW (ref 3.87–5.11)
RDW: 18 % — ABNORMAL HIGH (ref 11.5–15.5)
WBC Count: 2.5 10*3/uL — ABNORMAL LOW (ref 4.0–10.5)
nRBC: 0 % (ref 0.0–0.2)

## 2022-04-26 LAB — LACTATE DEHYDROGENASE: LDH: 215 U/L — ABNORMAL HIGH (ref 98–192)

## 2022-04-26 MED ORDER — SODIUM CHLORIDE 0.9% FLUSH
10.0000 mL | Freq: Once | INTRAVENOUS | Status: AC
  Administered 2022-04-26: 10 mL via INTRAVENOUS

## 2022-04-26 MED ORDER — IBRUTINIB 560 MG PO TABS
560.0000 mg | ORAL_TABLET | Freq: Every day | ORAL | 6 refills | Status: DC
  Filled 2022-04-26: qty 28, 28d supply, fill #0

## 2022-04-26 MED ORDER — HEPARIN SOD (PORK) LOCK FLUSH 100 UNIT/ML IV SOLN
500.0000 [IU] | Freq: Once | INTRAVENOUS | Status: AC
  Administered 2022-04-26: 500 [IU] via INTRAVENOUS

## 2022-04-26 NOTE — Progress Notes (Signed)
DISCONTINUE OFF PATHWAY REGIMEN - Lymphoma and CLL   [id number]:R-ICE (CIV Ifosfamide) q21 Days (Inpatient):   A cycle is every 21 days:     Rituximab-xxxx      Etoposide      Carboplatin      Ifosfamide      [place]      Pegfilgrastim-xxxx   **Always confirm dose/schedule in your pharmacy ordering system**  REASON: Toxicities / Adverse Event PRIOR TREATMENT: Off Pathway: R-ICE (CIV Ifosfamide) q21 Days (Inpatient) TREATMENT RESPONSE: Complete Response (CR)  START OFF PATHWAY REGIMEN - Lymphoma and CLL   [id number]:Rituximab IV/SUBQ D1 q7 Days:   Cycle 1: A cycle is 7 days:     Rituximab-xxxx    Cycles 2 and beyond: A cycle is every 7 days:     Rituximab and hyaluronidase human   **Always confirm dose/schedule in your pharmacy ordering system**  Patient Characteristics: Diffuse Large B-Cell Lymphoma or Follicular Lymphoma, Grade 3B, Relapsed / Refractory, All Stages,  Second Line, Relapse ? 12 Months From or Refractory to Prior Chemoimmunotherapy, Not a Candidate for CAR T-Cell Therapy, Not a Transplant Candidate Disease Type: Not Applicable Disease Type: Diffuse Large B-Cell Lymphoma Disease Type: Not Applicable Line of therapy: Relapsed / Refractory - Second Line Time to Relapse: Relapse ? 12 Months from Prior Chemoimmunotherapy Patient Characteristics: Not a Candidate for CAR T-Cell Therapy Patient Characteristics: Not a Transplant Candidate Intent of Therapy: Curative Intent, Discussed with Patient

## 2022-04-26 NOTE — Patient Instructions (Signed)

## 2022-04-26 NOTE — Progress Notes (Unsigned)
Reviewed MRI which shows excellent treatment response. She will now start Imbruvica with Rituxan while considering CAR-T.   Oncology Nurse Navigator Documentation     04/26/2022    9:30 AM  Oncology Nurse Navigator Flowsheets  Navigator Location [facility]  Navigator Encounter Type Scan Review;Appt/Treatment Plan Review  Patient Visit Type MedOnc  Treatment Phase Active Tx  Barriers/Navigation Needs No Barriers At This Time  Interventions None Required  Acuity Level 1-No Barriers  Support Groups/Services Friends and Family  Time Spent with Patient 15

## 2022-04-26 NOTE — Addendum Note (Signed)
Addended by: Lucile Crater on: 04/26/2022 10:01 AM   Modules accepted: Orders

## 2022-04-26 NOTE — Progress Notes (Signed)
Hematology and Oncology Follow Up Visit  Ariel [name] [id number] 1955/03/03 67 y.o. 04/26/2022   Principle Diagnosis:  Diffuse large cell non-Hodgkin's lymphoma-CNS involvement -- relapsed  Current Therapy:   Status post chemotherapy with R-ICE/R-MTV --  s/p cycle #4 MATRIX -- s/p cycle #4 -- start on 05/02/2021 --completed on 08/06/2021 Neulasta 6 mg subcu post chemotherapy MTV- s/p cycle #1 -- given on 01/22/204 Imbruvica/Rituxan -- start on 05/02/2022     Interim History:  Ariel [name] is comes in for follow-up.  Amazingly, she has had a complete response to the 1 cycle of  MTV.  However, she has had problems with her blood counts.  I am unsure as to why her blood counts have still been so low.  She is supposed to have a bone marrow biopsy.  However this cannot be done for another couple weeks.  I still would like to try to get her to CAR-T therapy.  I will have to speak to the head of the CAR-T therapy program that it [place].  I think for right now, it would be reasonable would be to get her on Imbruvica with Rituxan.  I think this would be reasonable and hopefully not affect her blood counts all that much.  There is some good data that shows that Imbruvica/Rituxan is effective in relapsed lymphoma in the CNS.  The dose of Imbruvica would be 560 mg a day.  She really looks good.  She sounds good.  She has had no problems with nausea or vomiting.  She is eating better.  There is no cough.  She has had no pain.  She has has had hip surgery bilaterally.  She is little bit sore in this area..  She has had no bleeding.  Is been no change in bowel or bladder habits.  She has had no leg swelling.  There is been no rashes.  Overall, I would say that her performance status is ECOG 1.   Medications:  Current Outpatient Medications:    acetaminophen (TYLENOL) 325 MG tablet, Take 1,300 mg by mouth daily as needed for headache, fever, moderate pain or mild pain., Disp: , Rfl:    benzonatate  (TESSALON) 100 MG capsule, Take 1 capsule (100 mg total) by mouth every 4 (four) hours., Disp: 20 capsule, Rfl: 0   dronabinol (MARINOL) 2.5 MG capsule, Take 1 capsule (2.5 mg total) by mouth 2 (two) times daily before a meal., Disp: 60 capsule, Rfl: 2   feeding supplement (ENSURE ENLIVE / ENSURE PLUS) LIQD, Take 237 mLs by mouth 3 (three) times daily between meals., Disp: 237 mL, Rfl: 0   ibrutinib (IMBRUVICA) 560 MG tablet, Take 1 tablet (560 mg total) by mouth daily. Take with a glass of water., Disp: 28 tablet, Rfl: 6   naphazoline-glycerin (CLEAR EYES REDNESS) 0.012-0.25 % SOLN, Place 2 drops into both eyes 4 (four) times daily as needed for eye irritation., Disp: 15 mL, Rfl: 0   polyethylene glycol (MIRALAX / GLYCOLAX) 17 g packet, Take 17 g by mouth daily as needed for mild constipation., Disp: 14 each, Rfl: 0 No current facility-administered medications for this visit.  Facility-Administered Medications Ordered in Other Visits:    sodium chloride flush (NS) 0.9 % injection 10 mL, 10 mL, Intravenous, PRN, Volanda Napoleon, MD, 10 mL at 10/04/21 0946  Allergies:  Allergies  Allergen Reactions   Decadron [Dexamethasone] Other (See Comments)    Leg weakness Hallucinations   Plavix [Clopidogrel] Hives, Swelling and Other (See Comments)  Lip swelling    Prednisone Other (See Comments)    Steroids caused psychological issues  Hallucinations   Codeine Nausea And Vomiting    Past Medical History, Surgical history, Social history, and Family History were reviewed and updated.  Review of Systems: Review of Systems  Constitutional: Negative.   HENT:  Negative.    Eyes: Negative.   Respiratory: Negative.    Cardiovascular: Negative.   Gastrointestinal: Negative.   Endocrine: Negative.   Genitourinary: Negative.    Musculoskeletal: Negative.   Skin: Negative.   Neurological: Negative.   Hematological: Negative.   Psychiatric/Behavioral: Negative.      Physical Exam:  height  is '5\' 7"'$  (1.702 m) and weight is 143 lb (64.9 kg). Her oral temperature is 98.2 F (36.8 C). Her blood pressure is 117/51 (abnormal) and her pulse is 90. Her respiration is 18 and oxygen saturation is 97%.   Wt Readings from Last 3 Encounters:  04/26/22 143 lb (64.9 kg)  04/18/22 140 lb (63.5 kg)  04/12/22 136 lb 3.9 oz (61.8 kg)    Physical Exam Vitals reviewed.  HENT:     Head: Normocephalic and atraumatic.  Eyes:     Pupils: Pupils are equal, round, and reactive to light.  Cardiovascular:     Rate and Rhythm: Normal rate and regular rhythm.     Heart sounds: Normal heart sounds.     Comments: Cardiac exam is regular rate and rhythm with no murmurs, rubs or bruits. Pulmonary:     Effort: Pulmonary effort is normal.     Breath sounds: Normal breath sounds.  Abdominal:     General: Bowel sounds are normal.     Palpations: Abdomen is soft.  Musculoskeletal:        General: No tenderness or deformity. Normal range of motion.     Cervical back: Normal range of motion.     Comments: .  Lymphadenopathy:     Cervical: No cervical adenopathy.  Skin:    General: Skin is warm and dry.     Findings: No erythema or rash.  Neurological:     Mental Status: She is alert and oriented to person, place, and time.     Comments: Neurological exam shows no focal neurological deficits.  All cranial nerves are intact.  She has good strength in upper and lower extremities.  Psychiatric:        Behavior: Behavior normal.        Thought Content: Thought content normal.        Judgment: Judgment normal.      Lab Results  Component Value Date   WBC 2.5 (L) 04/26/2022   HGB 10.9 (L) 04/26/2022   HCT 32.6 (L) 04/26/2022   MCV 92.9 04/26/2022   PLT 59 (L) 04/26/2022     Chemistry      Component Value Date/Time   NA 138 04/26/2022 0903   K 4.0 04/26/2022 0903   CL 100 04/26/2022 0903   CO2 27 04/26/2022 0903   BUN 10 04/26/2022 0903   CREATININE 0.62 04/26/2022 0903   CREATININE 0.68  11/18/2012 1439      Component Value Date/Time   CALCIUM 10.0 04/26/2022 0903   ALKPHOS 62 04/26/2022 0903   AST 22 04/26/2022 0903   ALT 28 04/26/2022 0903   BILITOT 0.6 04/26/2022 0903      Impression and Plan: Ariel [name] is a very charming 67 year old white female.  She initially came to [place] with iron deficiency anemia.  We subsequently  found that she had diffuse large cell non-Hodgkin's lymphoma.  It had traveled to her brain.  She has had a total of 8 cycles of treatment.  She had 4 cycles of a combination protocol.  We then gave her 4 cycles of MATRIX.  She now has the recurrence.  We had her in the hospital with high-dose methotrexate based therapy.  Again, she responded quite nicely.  Again because of her cytopenia, I still think we can use high-dose methotrexate right now.  Again, but if we just get her to CAR-T therapy this would be I think a good option.  We will see how she does with the IMBRUVICA/Rituxan.  The dose of Imbruvica would be 560 mg a day.  I hope that she will be able to have the Invega be administered at the assisted living facility.  I am just happy that she is responding.  She really looks quite good.  I just reset her blood counts would be better.  I just wish we could do the bone marrow sooner on her.  We will see about getting the Rituxan started next week.  I will see her back in about 3 or 4 weeks.   Volanda Napoleon, MD 2/28/20245:28 PM

## 2022-04-27 ENCOUNTER — Encounter: Payer: Self-pay | Admitting: Hematology & Oncology

## 2022-04-27 ENCOUNTER — Telehealth: Payer: Self-pay

## 2022-04-27 ENCOUNTER — Telehealth: Payer: Self-pay | Admitting: Pharmacist

## 2022-04-27 ENCOUNTER — Other Ambulatory Visit (HOSPITAL_COMMUNITY): Payer: Self-pay

## 2022-04-27 ENCOUNTER — Encounter: Payer: Self-pay | Admitting: *Deleted

## 2022-04-27 ENCOUNTER — Other Ambulatory Visit: Payer: Self-pay

## 2022-04-27 DIAGNOSIS — C8332 Diffuse large B-cell lymphoma, intrathoracic lymph nodes: Secondary | ICD-10-CM

## 2022-04-27 MED ORDER — IBRUTINIB 420 MG PO TABS
420.0000 mg | ORAL_TABLET | Freq: Every day | ORAL | 0 refills | Status: DC
  Filled 2022-04-27 – 2022-05-01 (×2): qty 28, 28d supply, fill #0

## 2022-04-27 NOTE — Telephone Encounter (Signed)
Called and left VM for patient to return my call so I can get their signatures on PAP Application.

## 2022-04-27 NOTE — Telephone Encounter (Signed)
Oral Oncology Patient Advocate Encounter  Reached out and spoke with patient regarding PAP paperwork, explained that I would send it to their preferred email via Docu[employer].   Confirmed email address: [email or web address].    Patient expressed understanding and consent.  Will follow up once paperwork has been signed and returned.   Berdine Addison, [facility] Patient [place]  [phone] (phone) [phone] (fax) 04/27/2022 11:15 AM

## 2022-04-27 NOTE — Telephone Encounter (Signed)
Sent application over to MD office to get prescriber's signature as well. I will submit once all required signatures have been returned to me.

## 2022-04-27 NOTE — Telephone Encounter (Signed)
Oral Oncology Patient Advocate Encounter   Began application for assistance for Imbruvica through [employer] Patient Access Support.   Application will be submitted upon completion of necessary supporting documentation.   [employer]'s phone number [phone].   I will continue to check the status until final determination.    Berdine Addison, [facility] Patient [place]  [phone] (phone) [phone] (fax) 04/27/2022 10:37 AM

## 2022-04-27 NOTE — Telephone Encounter (Signed)
Oral Oncology Pharmacist Encounter  Received new prescription for Imbruvica (ibrutinib) for the treatment of R/R DLBCL in conjunction with rituximab, planned duration until disease progression or unacceptable drug toxicity.  CBC w/ Diff and CMP from 04/26/22 assessed, pt with WBC 2.5 K/uL (ANC 1000), pltc 59 K/uL. Dosing of Imbruvica per MD - as 560 mg dosage form of Imbruvica was taken off market in April 2023, patient will be on 420 mg/d per Dr. Marin Olp. Prescription dose and frequency assessed.  Current medication list in Epic reviewed, no relevant/significant DDIs with Imbruvica identified.  Evaluated chart and no patient barriers to medication adherence noted.   Due to high unaffordable copay, Oral Chemotherapy Clinic will start manufacturer assistance process at this time.   Oral Oncology Clinic will continue to follow for insurance authorization, copayment issues, initial counseling and start date.  Leron Croak, PharmD, BCPS, BCOP Hematology/Oncology Clinical Pharmacist [facility] [place] [phone] 04/27/2022 11:30 AM

## 2022-04-27 NOTE — Telephone Encounter (Signed)
Received MD signatures. I will submit once patient signatures are complete.    Ariel [name], Ariel [name] Patient [place]  [phone] (phone) [phone] (fax) 04/27/2022 12:41 PM

## 2022-04-27 NOTE — Progress Notes (Signed)
Scheduling called patient to schedule infusion for next week. She would not schedule as she had questions. I called and spoke to patient. Answered her questions to her satisfaction and she is ready to schedule. Transferred patient to schedulers.   Oncology Nurse Navigator Documentation     04/27/2022   11:15 AM  Oncology Nurse Navigator Flowsheets  Navigator Follow Up Date: 05/03/2022  Navigator Follow Up Reason: Chemotherapy  Navigator Location [facility]  Navigator Encounter Type Telephone  Telephone Education;Outgoing Call  Patient Visit Type MedOnc  Treatment Phase Active Tx  Barriers/Navigation Needs Education  Education Preparing for Upcoming Surgery/ Treatment  Interventions Education  Acuity Level 1-No Barriers  Education Method Verbal  Support Groups/Services Friends and Family  Time Spent with Patient 15

## 2022-04-27 NOTE — Telephone Encounter (Signed)
Oral Oncology Patient Advocate Encounter  After completing a benefits investigation, prior authorization for Imbruvica is not required at this time through [place].  Patient's copay is $3,119.42.     Berdine Addison, [facility] Patient [place]  [phone] (phone) [phone] (fax) 04/27/2022 8:55 AM

## 2022-04-28 ENCOUNTER — Other Ambulatory Visit (HOSPITAL_COMMUNITY): Payer: Self-pay

## 2022-04-28 ENCOUNTER — Encounter: Payer: Self-pay | Admitting: Hematology & Oncology

## 2022-04-28 ENCOUNTER — Telehealth: Payer: Self-pay

## 2022-04-28 NOTE — Telephone Encounter (Signed)
Oral Oncology Patient Advocate Encounter   Was successful in obtaining a voucher card for Imbruvica.  This voucher card will make the patients copay $0 for the first fill.   The billing information is as follows and has been shared with [place].   RxBin: [id number] PCN: Loyalty Member ID: [id number] Group ID: [id number]   Berdine Addison, [facility] Patient [place]  [phone] (phone) [phone] (fax) 04/28/2022 10:54 AM

## 2022-05-01 ENCOUNTER — Other Ambulatory Visit: Payer: Self-pay

## 2022-05-01 ENCOUNTER — Other Ambulatory Visit: Payer: Self-pay | Admitting: *Deleted

## 2022-05-01 ENCOUNTER — Other Ambulatory Visit (HOSPITAL_COMMUNITY): Payer: Self-pay

## 2022-05-01 DIAGNOSIS — C8332 Diffuse large B-cell lymphoma, intrathoracic lymph nodes: Secondary | ICD-10-CM

## 2022-05-01 MED ORDER — IBRUTINIB 420 MG PO TABS
420.0000 mg | ORAL_TABLET | Freq: Every day | ORAL | 3 refills | Status: DC
  Filled 2022-05-01: qty 28, 28d supply, fill #0

## 2022-05-01 MED ORDER — IBRUTINIB 420 MG PO TABS: 420.0000 mg | ORAL_TABLET | Freq: Every day | ORAL | 3 refills | Status: DC

## 2022-05-01 NOTE — Telephone Encounter (Signed)
Patient successfully OnBoarded. Imbruvica scheduled to be shipped by [place] on 05/01/22 for delivery 05/02/22 using [place] card while we wait for Patient Assistance to process.    Berdine Addison, [facility] Patient [place]  [phone] (phone) [phone] (fax) 05/01/2022 1:36 PM

## 2022-05-01 NOTE — Addendum Note (Signed)
Addended byBritt Boozer on: 05/01/2022 04:01 PM   Modules accepted: Orders

## 2022-05-01 NOTE — Telephone Encounter (Signed)
Oral Chemotherapy Pharmacist Encounter  I spoke with patient for overview of: Imbruvica (ibrutinib) for the treatment of R/R DLBCL in conjunction with rituximab, planned duration until disease progression or unacceptable toxicity.   Counseled patient on administration, dosing, side effects, monitoring, drug-food interactions, safe handling, storage, and disposal.  Patient will take Imbruvica '420mg'$  tablet, 1 tablet ('420mg'$ ) by mouth once daily.  Patient will take Imbruvica at approximately the same time each day with a full glass of water and maintain adequate hydration throughout the day.  Patient knows to avoid grapefruit or grapefruit juice while on therapy with Imbruvica.  Imbruvica start date: 05/03/22  Adverse effects include but are not limited to: bruising, decreased blood counts, diarrhea, musculoskeletal pain, arthralgias, peripheral edema and hypertension. Diarrhea: Patient will obtain anti diarrheal and alert the office of 4 or more loose stools above baseline.  Reviewed with patient importance of keeping a medication schedule and plan for any missed doses. No barriers to medication adherence identified.  Medication reconciliation performed and medication/allergy list updated.  Free trial offer utilized for first fill of Imbruvica while awaiting manufacturer assistance.   All questions answered.  Ms. Dahan voiced understanding and appreciation.   Medication education handout placed in mail for patient. Patient knows to call the office with questions or concerns. Oral Chemotherapy Clinic phone number provided to patient.   Leron Croak, PharmD, BCPS, [facility] Hematology/Oncology Clinical Pharmacist [place] [facility] [place] [phone] 05/01/2022 1:38 PM

## 2022-05-01 NOTE — Telephone Encounter (Addendum)
Patient could not get DocuSign to work properly do they requested that application be sent to MD office for signature at next appointment on 05/03/22. MD office has been notified and application sent for patient signatures.    Berdine Addison, [facility] Patient [place]  [phone] (phone) [phone] (fax) 05/01/2022 1:52 PM

## 2022-05-02 ENCOUNTER — Other Ambulatory Visit (HOSPITAL_COMMUNITY): Payer: Self-pay

## 2022-05-02 DIAGNOSIS — M62552 Muscle wasting and atrophy, not elsewhere classified, left thigh: Secondary | ICD-10-CM | POA: Diagnosis not present

## 2022-05-02 DIAGNOSIS — R2681 Unsteadiness on feet: Secondary | ICD-10-CM | POA: Diagnosis not present

## 2022-05-02 DIAGNOSIS — M62562 Muscle wasting and atrophy, not elsewhere classified, left lower leg: Secondary | ICD-10-CM | POA: Diagnosis not present

## 2022-05-02 DIAGNOSIS — M62551 Muscle wasting and atrophy, not elsewhere classified, right thigh: Secondary | ICD-10-CM | POA: Diagnosis not present

## 2022-05-02 DIAGNOSIS — M62561 Muscle wasting and atrophy, not elsewhere classified, right lower leg: Secondary | ICD-10-CM | POA: Diagnosis not present

## 2022-05-02 NOTE — Progress Notes (Unsigned)
Rapid Infusion Rituximab Pharmacist Evaluation  Ariel [name] is a 67 y.o. female being treated with rituximab for NHL. This patient may be considered for RIR.   A pharmacist has verified the patient tolerated rituximab infusions per the [facility] standard infusion protocol without grade 3-4 infusion reactions. The treatment plan will be updated to reflect RIR if the patient qualifies per the checklist below:   Age > 74 years old No   Clinically significant cardiovascular disease No   Circulating lymphocyte count < 5000/uL prior to cycle two Yes  Lab Results  Component Value Date   LYMPHSABS 0.9 04/26/2022    Prior documented grade 3-4 infusion reaction to rituximab No   Prior documented grade 1-2 infusion reaction to rituximab (If YES, Pharmacist will confirm with Physician if patient is still a candidate for RIR) No   Previous rituximab infusion within the past 6 months Yes   Treatment Plan updated orders to reflect Ariel [name] does meet the criteria for Rapid Infusion Rituximab. This patient is going to be switched to rapid infusion rituximab.   [name], Jacqlyn Larsen 05/02/22 2:02 PM

## 2022-05-03 ENCOUNTER — Inpatient Hospital Stay: Payer: Medicare Other | Attending: Internal Medicine

## 2022-05-03 ENCOUNTER — Inpatient Hospital Stay: Payer: Medicare Other

## 2022-05-03 ENCOUNTER — Other Ambulatory Visit: Payer: Self-pay

## 2022-05-03 ENCOUNTER — Other Ambulatory Visit (HOSPITAL_COMMUNITY): Payer: Self-pay

## 2022-05-03 ENCOUNTER — Encounter: Payer: Self-pay | Admitting: *Deleted

## 2022-05-03 VITALS — BP 103/52 | HR 75 | Temp 98.7°F | Resp 18 | Ht 67.0 in | Wt 147.0 lb

## 2022-05-03 DIAGNOSIS — Z79899 Other long term (current) drug therapy: Secondary | ICD-10-CM | POA: Diagnosis not present

## 2022-05-03 DIAGNOSIS — C8510 Unspecified B-cell lymphoma, unspecified site: Secondary | ICD-10-CM

## 2022-05-03 DIAGNOSIS — Z5111 Encounter for antineoplastic chemotherapy: Secondary | ICD-10-CM | POA: Insufficient documentation

## 2022-05-03 DIAGNOSIS — C8339 Diffuse large B-cell lymphoma, extranodal and solid organ sites: Secondary | ICD-10-CM | POA: Diagnosis not present

## 2022-05-03 DIAGNOSIS — C8332 Diffuse large B-cell lymphoma, intrathoracic lymph nodes: Secondary | ICD-10-CM

## 2022-05-03 LAB — CBC WITH DIFFERENTIAL (CANCER CENTER ONLY)
Abs Immature Granulocytes: 0.02 10*3/uL (ref 0.00–0.07)
Basophils Absolute: 0 10*3/uL (ref 0.0–0.1)
Basophils Relative: 1 %
Eosinophils Absolute: 0 10*3/uL (ref 0.0–0.5)
Eosinophils Relative: 1 %
HCT: 31.6 % — ABNORMAL LOW (ref 36.0–46.0)
Hemoglobin: 10.7 g/dL — ABNORMAL LOW (ref 12.0–15.0)
Immature Granulocytes: 1 %
Lymphocytes Relative: 36 %
Lymphs Abs: 1 10*3/uL (ref 0.7–4.0)
MCH: 32.1 pg (ref 26.0–34.0)
MCHC: 33.9 g/dL (ref 30.0–36.0)
MCV: 94.9 fL (ref 80.0–100.0)
Monocytes Absolute: 0.6 10*3/uL (ref 0.1–1.0)
Monocytes Relative: 19 %
Neutro Abs: 1.2 10*3/uL — ABNORMAL LOW (ref 1.7–7.7)
Neutrophils Relative %: 42 %
Platelet Count: 101 10*3/uL — ABNORMAL LOW (ref 150–400)
RBC: 3.33 MIL/uL — ABNORMAL LOW (ref 3.87–5.11)
RDW: 19.5 % — ABNORMAL HIGH (ref 11.5–15.5)
WBC Count: 2.9 10*3/uL — ABNORMAL LOW (ref 4.0–10.5)
nRBC: 0 % (ref 0.0–0.2)

## 2022-05-03 LAB — CMP (CANCER CENTER ONLY)
ALT: 21 U/L (ref 0–44)
AST: 22 U/L (ref 15–41)
Albumin: 4.3 g/dL (ref 3.5–5.0)
Alkaline Phosphatase: 61 U/L (ref 38–126)
Anion gap: 9 (ref 5–15)
BUN: 9 mg/dL (ref 8–23)
CO2: 28 mmol/L (ref 22–32)
Calcium: 9.7 mg/dL (ref 8.9–10.3)
Chloride: 101 mmol/L (ref 98–111)
Creatinine: 0.68 mg/dL (ref 0.44–1.00)
GFR, Estimated: >60 mL/min (ref >60)
Glucose, Bld: 139 mg/dL — ABNORMAL HIGH (ref 70–99)
Potassium: 3.8 mmol/L (ref 3.5–5.1)
Sodium: 138 mmol/L (ref 135–145)
Total Bilirubin: 0.5 mg/dL (ref 0.3–1.2)
Total Protein: 6.3 g/dL — ABNORMAL LOW (ref 6.5–8.1)

## 2022-05-03 MED ORDER — LIDOCAINE-PRILOCAINE 2.5-2.5 % EX CREA
TOPICAL_CREAM | CUTANEOUS | 3 refills | Status: DC
  Filled 2022-05-03: qty 30, 1d supply, fill #0

## 2022-05-03 MED ORDER — ACETAMINOPHEN 325 MG PO TABS
650.0000 mg | ORAL_TABLET | Freq: Once | ORAL | Status: AC
  Administered 2022-05-03: 650 mg via ORAL
  Filled 2022-05-03: qty 2

## 2022-05-03 MED ORDER — HEPARIN SOD (PORK) LOCK FLUSH 100 UNIT/ML IV SOLN
500.0000 [IU] | Freq: Once | INTRAVENOUS | Status: AC | PRN
  Administered 2022-05-03: 500 [IU]

## 2022-05-03 MED ORDER — SODIUM CHLORIDE 0.9 % IV SOLN
500.0000 mg/m2 | Freq: Once | INTRAVENOUS | Status: AC
  Administered 2022-05-03: 900 mg via INTRAVENOUS
  Filled 2022-05-03: qty 40

## 2022-05-03 MED ORDER — SODIUM CHLORIDE 0.9 % IV SOLN: Freq: Once | INTRAVENOUS | Status: AC

## 2022-05-03 MED ORDER — DIPHENHYDRAMINE HCL 25 MG PO CAPS
50.0000 mg | ORAL_CAPSULE | Freq: Once | ORAL | Status: AC
  Administered 2022-05-03: 50 mg via ORAL
  Filled 2022-05-03: qty 2

## 2022-05-03 MED ORDER — SODIUM CHLORIDE 0.9% FLUSH
10.0000 mL | INTRAVENOUS | Status: DC | PRN
  Administered 2022-05-03: 10 mL

## 2022-05-03 NOTE — Progress Notes (Signed)
Patient is here to start her new treatment. She is doing well without complaints or questions.   Oncology Nurse Navigator Documentation     05/03/2022   10:15 AM  Oncology Nurse Navigator Flowsheets  Navigator Follow Up Date: 05/10/2022  Navigator Follow Up Reason: Follow-up Appointment  Navigator Location [facility]  Navigator Encounter Type Treatment  Patient Visit Type MedOnc  Treatment Phase Active Tx  Barriers/Navigation Needs No Barriers At This Time  Interventions Psycho-Social Support  Acuity Level 1-No Barriers  Support Groups/Services Friends and Family  Time Spent with Patient 15

## 2022-05-03 NOTE — Patient Instructions (Signed)
[facility]  Discharge Instructions: Thank you for choosing [facility] to provide your oncology and hematology care.   If you have a lab appointment with the [facility], please go directly to the [facility] and check in at the registration area.  Wear comfortable clothing and clothing appropriate for easy access to any Portacath or PICC line.   We strive to give you quality time with your provider. You may need to reschedule your appointment if you arrive late (15 or more minutes).  Arriving late affects you and other patients whose appointments are after yours.  Also, if you miss three or more appointments without notifying the office, you may be dismissed from the clinic at the provider's discretion.      For prescription refill requests, have your pharmacy contact our office and allow 72 hours for refills to be completed.    Today you received the following chemotherapy and/or immunotherapy agents ruxience, tylenol, benadryl      To help prevent nausea and vomiting after your treatment, we encourage you to take your nausea medication as directed.  BELOW ARE SYMPTOMS THAT SHOULD BE REPORTED IMMEDIATELY: *FEVER GREATER THAN 100.4 F (38 C) OR HIGHER *CHILLS OR SWEATING *NAUSEA AND VOMITING THAT IS NOT CONTROLLED WITH YOUR NAUSEA MEDICATION *UNUSUAL SHORTNESS OF BREATH *UNUSUAL BRUISING OR BLEEDING *URINARY PROBLEMS (pain or burning when urinating, or frequent urination) *BOWEL PROBLEMS (unusual diarrhea, constipation, pain near the anus) TENDERNESS IN MOUTH AND THROAT WITH OR WITHOUT PRESENCE OF ULCERS (sore throat, sores in mouth, or a toothache) UNUSUAL RASH, SWELLING OR PAIN  UNUSUAL VAGINAL DISCHARGE OR ITCHING   Items with * indicate a potential emergency and should be followed up as soon as possible or go to the Emergency Department if any problems should occur.  Please show the CHEMOTHERAPY ALERT CARD or IMMUNOTHERAPY  ALERT CARD at check-in to the Emergency Department and triage nurse. Should you have questions after your visit or need to cancel or reschedule your appointment, please contact [facility]  [phone] and follow the prompts.  Office hours are 8:00 a.m. to 4:30 p.m. Monday - Friday. Please note that voicemails left after 4:00 p.m. may not be returned until the following business day.  We are closed weekends and major holidays. You have access to a nurse at all times for urgent questions. Please call the main number to the clinic [phone] and follow the prompts.  For any non-urgent questions, you may also contact your provider using MyChart. We now offer e-Visits for anyone 79 and older to request care online for non-urgent symptoms. For details visit [email or web address].   Also download the MyChart app! Go to the app store, search "MyChart", open the app, select [facility], and log in with your MyChart username and password.

## 2022-05-03 NOTE — Telephone Encounter (Signed)
Oral Oncology Patient Advocate Encounter   Submitted application for assistance for Imbruvica to [employer] Patient [employer].   Application submitted via e-fax to [phone]   [employer]'s phone number [phone].   I will continue to check the status until final determination.    Berdine Addison, [facility] Patient [place]  [phone] (phone) [phone] (fax) 05/03/2022 10:09 AM

## 2022-05-04 ENCOUNTER — Telehealth: Payer: Self-pay

## 2022-05-04 DIAGNOSIS — M62561 Muscle wasting and atrophy, not elsewhere classified, right lower leg: Secondary | ICD-10-CM | POA: Diagnosis not present

## 2022-05-04 DIAGNOSIS — R2681 Unsteadiness on feet: Secondary | ICD-10-CM | POA: Diagnosis not present

## 2022-05-04 DIAGNOSIS — M62551 Muscle wasting and atrophy, not elsewhere classified, right thigh: Secondary | ICD-10-CM | POA: Diagnosis not present

## 2022-05-04 DIAGNOSIS — M62552 Muscle wasting and atrophy, not elsewhere classified, left thigh: Secondary | ICD-10-CM | POA: Diagnosis not present

## 2022-05-04 DIAGNOSIS — M62562 Muscle wasting and atrophy, not elsewhere classified, left lower leg: Secondary | ICD-10-CM | POA: Diagnosis not present

## 2022-05-04 NOTE — Patient Outreach (Signed)
  Care Coordination   Initial Visit Note   05/04/2022 Name: Ariel [name] MRN: [id number] DOB: 03-13-1955  Ariel [name] is a 67 y.o. year old female who sees [name], NP for primary care. Incoming call from patient returning RN CM call.   What matters to the patients health and wellness today?  Patient shares that she is being followed by oncologist for cancer txs. She is doing fairly well with txs an does not experience many SEs. She does have some neuropathy-currently not taking anything. Patient not interested in taking anything -states she "takes enough meds already." She voices sxs are managed at present. Patient shares that she resides at [facility] and gets good care. They take her to her appts and help her manage her meds. She voices no THN needs or concerns at this time but appreciative of call.      SDOH assessments and interventions completed:  Yes  SDOH Interventions Today    Flowsheet Row Most Recent Value  SDOH Interventions   Food Insecurity Interventions Intervention Not Indicated  Transportation Interventions Intervention Not Indicated        Care Coordination Interventions:  Yes, provided   Follow up plan: No further intervention required.   Encounter Outcome:  Pt. Visit Completed   Hetty Blend [facility] Care Management Care Management Community Coordinator Direct Phone: [phone] Toll Free: [phone] Fax: [phone]

## 2022-05-04 NOTE — Patient Outreach (Signed)
  Care Coordination   05/04/2022 Name: Lyndi Limas MRN: [id number] DOB: January 24, 1956   Care Coordination Outreach Attempts:  An unsuccessful telephone outreach was attempted today to offer the patient information about available care coordination services as a benefit of their health plan.   Follow Up Plan:  Additional outreach attempts will be made to offer the patient care coordination information and services.   Encounter Outcome:  No Answer   Care Coordination Interventions:  No, not indicated      Enzo Montgomery, RN,BSN,CCM [facility] Care Management Care Management Community Coordinator Direct Phone: [phone] Toll Free: [phone] Fax: [phone]

## 2022-05-05 ENCOUNTER — Other Ambulatory Visit: Payer: Self-pay | Admitting: Radiology

## 2022-05-05 DIAGNOSIS — C7931 Secondary malignant neoplasm of brain: Secondary | ICD-10-CM

## 2022-05-05 NOTE — H&P (Signed)
Referring Physician(s): Ennever,Peter R  Supervising Physician: Markus Daft  Patient Status:  [facility] OP  Chief Complaint:  "I'm getting a bone marrow biopsy"  Subjective: Pt known to IR team from port a cath placement in 2022. 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 She is scheduled today for image guided bone marrow biopsy for further evaluation. She denies fever,HA,CP,dyspnea, cough, abd/back pain,N/V or bleeding.      Past Medical History:  Diagnosis Date   Arthritis    Chicken pox    Complication of anesthesia    Per patient, very slow to wake up after anesthesia   Goals of care, counseling/discussion 12/22/2020   High cholesterol    High grade B-cell lymphoma ([place]) 12/22/2020   Hyperglycemia 11/10/2020   Melanoma ([place]) 2021   right hip   Melanoma ([place]) 10/28/2020   pt stated brain liver and bladder   PONV (postoperative nausea and vomiting)    Stroke [facility])    Urinary incontinence    Past Surgical History:  Procedure Laterality Date   AIR/FLUID EXCHANGE Right 12/07/2020   Procedure: AIR/FLUID EXCHANGE;  Surgeon: Jalene Mullet, MD;  Location: [facility];  Service: Ophthalmology;  Laterality: Right;   APPENDECTOMY  1962   BIOPSY  12/14/2020   Procedure: BIOPSY;  Surgeon: Rush Landmark Telford Nab., MD;  Location: [facility];  Service: Gastroenterology;;   ESOPHAGOGASTRODUODENOSCOPY (EGD) WITH PROPOFOL N/A 12/14/2020   Procedure: ESOPHAGOGASTRODUODENOSCOPY (EGD) WITH PROPOFOL;  Surgeon: Irving Copas., MD;  Location: [facility];  Service: Gastroenterology;  Laterality: N/A;   EXCISION MELANOMA WITH SENTINEL LYMPH NODE BIOPSY Right 10/09/2019   Procedure: WIDE LOCAL EXCISION WITH ADVANCEMENT FLAP CLOSURE RIGHT HIP MELANOMA WITH SENTINEL LYMPH NODE BIOPSY;  Surgeon: Stark Klein, MD;  Location: [facility];  Service: General;  Laterality: Right;   FINE NEEDLE ASPIRATION   12/14/2020   Procedure: FINE NEEDLE ASPIRATION (FNA) LINEAR;  Surgeon: Irving Copas., MD;  Location: [facility];  Service: Gastroenterology;;   HERNIA REPAIR  2009   IR IMAGING GUIDED PORT INSERTION  12/30/2020   MOUTH SURGERY  11/2015   gum surgery and bone implant   PARS PLANA VITRECTOMY Right 12/07/2020   Procedure: PARS PLANA VITRECTOMY WITH 25 GAUGE;  Surgeon: Jalene Mullet, MD;  Location: [facility];  Service: Ophthalmology;  Laterality: Right;   PHOTOCOAGULATION WITH LASER Right 12/07/2020   Procedure: PHOTOCOAGULATION WITH ENDOLASER PANRITINAL COAGULATION;  Surgeon: Jalene Mullet, MD;  Location: [facility];  Service: Ophthalmology;  Laterality: Right;   TOTAL HIP ARTHROPLASTY Left 10/11/2021   Procedure: LEFT TOTAL HIP ARTHROPLASTY ANTERIOR APPROACH;  Surgeon: Melrose Nakayama, MD;  [facility];  Service: Orthopedics;  Laterality: Left;   TOTAL HIP ARTHROPLASTY Right 01/10/2022   Procedure: RIGHT TOTAL HIP ARTHROPLASTY ANTERIOR APPROACH;  Surgeon: Melrose Nakayama, MD;  [facility];  Service: Orthopedics;  Laterality: Right;   UPPER ESOPHAGEAL ENDOSCOPIC ULTRASOUND (EUS) N/A 12/14/2020   Procedure: UPPER ESOPHAGEAL ENDOSCOPIC ULTRASOUND (EUS);  Surgeon: Irving Copas., MD;  Location: [facility];  Service: Gastroenterology;  Laterality: N/A;   WISDOM TOOTH EXTRACTION        Allergies: Decadron [dexamethasone], Plavix [clopidogrel], Prednisone, and Codeine  Medications: Prior to Admission medications   Medication Sig Start Date End Date Taking? Authorizing Provider  acetaminophen (TYLENOL) 325 MG tablet Take 1,300 mg by mouth daily as needed for headache, fever, moderate pain or mild pain.    [provider]  benzonatate (  TESSALON) 100 MG capsule Take 1 capsule (100 mg total) by mouth every 4 (four) hours. 04/14/22   Allie Bossier, MD  dronabinol (MARINOL) 2.5 MG capsule Take 1 capsule (2.5 mg total) by mouth 2 (two) times daily before a meal.  04/21/22   Ennever, Rudell Cobb, MD  feeding supplement (ENSURE ENLIVE / ENSURE PLUS) LIQD Take 237 mLs by mouth 3 (three) times daily between meals. 04/14/22   Allie Bossier, MD  ibrutinib (IMBRUVICA) 420 MG tablet Take 1 tablet (420 mg total) by mouth daily. Take with a glass of water. 05/01/22   Volanda Napoleon, MD  lidocaine-prilocaine (EMLA) cream Apply to affected area once 05/03/22   Volanda Napoleon, MD  naphazoline-glycerin (CLEAR EYES REDNESS) 0.012-0.25 % SOLN Place 2 drops into both eyes 4 (four) times daily as needed for eye irritation. 04/14/22   Allie Bossier, MD  polyethylene glycol (MIRALAX / GLYCOLAX) 17 g packet Take 17 g by mouth daily as needed for mild constipation. 04/14/22   Allie Bossier, MD     Vital Signs: Vitals:   05/08/22 0701  BP: 132/71  Pulse: 74  Resp: 16  Temp: 98.1 F (36.7 C)  SpO2: 97%       Code Status: FULL CODE    Physical Exam: awake/alert; chest- few fine bibasilar crackles, clean intact rt chest port a cath; heart- RRR; abd- soft,+BS,NT; no LE edema  Imaging: No results found.  Labs:  CBC: Recent Labs    04/14/22 0450 04/18/22 1155 04/26/22 0903 05/03/22 0908  WBC 2.9* 3.1* 2.5* 2.9*  HGB 10.3* 10.7* 10.9* 10.7*  HCT 32.5* 31.7* 32.6* 31.6*  PLT 29* 35* 59* 101*    COAGS: Recent Labs    05/17/21 0611 09/13/21 1055 12/19/21 1027 03/17/22 1039 04/09/22 0250  INR 1.1 0.9 0.9 0.9 1.0  APTT 28  --   --  29 30    BMP: Recent Labs    04/14/22 0450 04/18/22 1155 04/26/22 0903 05/03/22 0908  NA 135 138 138 138  K 3.9 3.9 4.0 3.8  CL 96* 101 100 101  CO2 '30 29 27 28  '$ GLUCOSE 146* 113* 142* 139*  BUN '15 13 10 9  '$ CALCIUM 9.0 9.8 10.0 9.7  CREATININE 0.55 0.63 0.62 0.68  GFRNONAA >60 >60 >60 >60    LIVER FUNCTION TESTS: Recent Labs    04/14/22 0450 04/18/22 1155 04/26/22 0903 05/03/22 0908  BILITOT 0.3 0.4 0.6 0.5  AST 38 '25 22 22  '$ ALT 56* 54* 28 21  ALKPHOS 72 80 62 61  PROT 5.4* 6.3* 6.3* 6.3*   ALBUMIN 2.8* 4.1 4.4 4.3    Assessment and Plan: Pt known to IR team from port a cath placement in 2022. 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 She is scheduled today for image guided bone marrow biopsy for further evaluation. Risks and benefits of procedure was discussed with the patient and/or patient's family including, but not limited to bleeding, infection, damage to adjacent structures or low yield requiring additional tests.  All of the questions were answered and there is agreement to proceed.  Consent signed and in chart.    Electronically Signed: D. Rowe Robert, PA-C 05/05/2022, 12:02 PM   I spent a total of 20 minutes at the the patient's bedside AND on the patient's hospital floor or unit, greater than 50% of which was counseling/coordinating care for image guided  bone marrow biopsy

## 2022-05-08 ENCOUNTER — Other Ambulatory Visit (HOSPITAL_COMMUNITY): Payer: Self-pay

## 2022-05-08 ENCOUNTER — Encounter: Payer: Self-pay | Admitting: *Deleted

## 2022-05-08 ENCOUNTER — Encounter (HOSPITAL_COMMUNITY): Payer: Self-pay

## 2022-05-08 ENCOUNTER — Other Ambulatory Visit: Payer: Self-pay

## 2022-05-08 ENCOUNTER — Ambulatory Visit (HOSPITAL_COMMUNITY)
Admission: RE | Admit: 2022-05-08 | Discharge: 2022-05-08 | Disposition: A | Discharge status: Home / Self Care | Payer: Medicare Other | Source: Ambulatory Visit | Attending: Hematology & Oncology | Admitting: Hematology & Oncology

## 2022-05-08 DIAGNOSIS — D61818 Other pancytopenia: Secondary | ICD-10-CM | POA: Diagnosis not present

## 2022-05-08 DIAGNOSIS — C8510 Unspecified B-cell lymphoma, unspecified site: Secondary | ICD-10-CM | POA: Diagnosis not present

## 2022-05-08 DIAGNOSIS — C8590 Non-Hodgkin lymphoma, unspecified, unspecified site: Secondary | ICD-10-CM | POA: Diagnosis not present

## 2022-05-08 DIAGNOSIS — E785 Hyperlipidemia, unspecified: Secondary | ICD-10-CM | POA: Diagnosis not present

## 2022-05-08 DIAGNOSIS — D696 Thrombocytopenia, unspecified: Secondary | ICD-10-CM | POA: Diagnosis not present

## 2022-05-08 DIAGNOSIS — D7589 Other specified diseases of blood and blood-forming organs: Secondary | ICD-10-CM | POA: Diagnosis not present

## 2022-05-08 DIAGNOSIS — C7931 Secondary malignant neoplasm of brain: Secondary | ICD-10-CM

## 2022-05-08 HISTORY — PX: IR BONE MARROW BIOPSY & ASPIRATION: IMG5727

## 2022-05-08 LAB — CBC WITH DIFFERENTIAL/PLATELET
Abs Immature Granulocytes: 0.04 10*3/uL (ref 0.00–0.07)
Basophils Absolute: 0 10*3/uL (ref 0.0–0.1)
Basophils Relative: 0 %
Eosinophils Absolute: 0 10*3/uL (ref 0.0–0.5)
Eosinophils Relative: 1 %
HCT: 33.1 % — ABNORMAL LOW (ref 36.0–46.0)
Hemoglobin: 11.1 g/dL — ABNORMAL LOW (ref 12.0–15.0)
Immature Granulocytes: 1 %
Lymphocytes Relative: 27 %
Lymphs Abs: 0.9 10*3/uL (ref 0.7–4.0)
MCH: 31.6 pg (ref 26.0–34.0)
MCHC: 33.5 g/dL (ref 30.0–36.0)
MCV: 94.3 fL (ref 80.0–100.0)
Monocytes Absolute: 0.5 10*3/uL (ref 0.1–1.0)
Monocytes Relative: 14 %
Neutro Abs: 1.8 10*3/uL (ref 1.7–7.7)
Neutrophils Relative %: 57 %
Platelets: 102 10*3/uL — ABNORMAL LOW (ref 150–400)
RBC: 3.51 MIL/uL — ABNORMAL LOW (ref 3.87–5.11)
RDW: 19.5 % — ABNORMAL HIGH (ref 11.5–15.5)
WBC: 3.3 10*3/uL — ABNORMAL LOW (ref 4.0–10.5)
nRBC: 0 % (ref 0.0–0.2)

## 2022-05-08 MED ORDER — SODIUM CHLORIDE 0.9 % IV SOLN: INTRAVENOUS | Status: DC

## 2022-05-08 MED ORDER — FENTANYL CITRATE (PF) 100 MCG/2ML IJ SOLN
INTRAMUSCULAR | Status: AC
  Filled 2022-05-08: qty 2

## 2022-05-08 MED ORDER — MIDAZOLAM HCL 2 MG/2ML IJ SOLN
INTRAMUSCULAR | Status: AC
  Filled 2022-05-08: qty 2

## 2022-05-08 MED ORDER — MIDAZOLAM HCL 2 MG/2ML IJ SOLN
INTRAMUSCULAR | Status: AC | PRN
  Administered 2022-05-08 (×2): 1 mg via INTRAVENOUS

## 2022-05-08 MED ORDER — FENTANYL CITRATE (PF) 100 MCG/2ML IJ SOLN
INTRAMUSCULAR | Status: AC | PRN
  Administered 2022-05-08 (×2): 50 ug via INTRAVENOUS

## 2022-05-08 MED ORDER — HEPARIN SOD (PORK) LOCK FLUSH 100 UNIT/ML IV SOLN
500.0000 [IU] | INTRAVENOUS | Status: AC | PRN
  Administered 2022-05-08: 500 [IU]
  Filled 2022-05-08: qty 5

## 2022-05-08 MED ORDER — LIDOCAINE HCL (PF) 1 % IJ SOLN
INTRAMUSCULAR | Status: AC
  Administered 2022-05-08: 10 mL
  Filled 2022-05-08: qty 30

## 2022-05-08 NOTE — Telephone Encounter (Signed)
Received notification from [place] that additional information was needed to continue processing application. They are requesting Proof of Income to continue. I will submit for processing once in hand.    Ariel [name], [facility] Patient [place]  [phone] (phone) [phone] (fax) 05/08/2022 8:05 AM

## 2022-05-08 NOTE — Discharge Instructions (Signed)
Please call Interventional Radiology clinic [phone] with any questions or concerns. ? ?You may remove your dressing and shower tomorrow. ? ? ?Bone Marrow Aspiration and Bone Marrow Biopsy, Adult, Care After ?This sheet gives you information about how to care for yourself after your procedure. Your health care provider may also give you more specific instructions. If you have problems or questions, contact your health care provider. ?What can I expect after the procedure? ?After the procedure, it is common to have: ?Mild pain and tenderness. ?Swelling. ?Bruising. ?Follow these instructions at home: ?Puncture site care ?Follow instructions from your health care provider about how to take care of the puncture site. Make sure you: ?Wash your hands with soap and water before and after you change your bandage (dressing). If soap and water are not available, use hand sanitizer. ?Change your dressing as told by your health care provider. ?Check your puncture site every day for signs of infection. Check for: ?More redness, swelling, or pain. ?Fluid or blood. ?Warmth. ?Pus or a bad smell.   ?Activity ?Return to your normal activities as told by your health care provider. Ask your health care provider what activities are safe for you. ?Do not lift anything that is heavier than 10 lb (4.5 kg), or the limit that you are told, until your health care provider says that it is safe. ?Do not drive for 24 hours if you were given a sedative during your procedure. ?General instructions ?Take over-the-counter and prescription medicines only as told by your health care provider. ?Do not take baths, swim, or use a hot tub until your health care provider approves. Ask your health care provider if you may take showers. You may only be allowed to take sponge baths. ?If directed, put ice on the affected area. To do this: ?Put ice in a plastic bag. ?Place a towel between your skin and the bag. ?Leave the ice on for 20 minutes, 2-3 times a  day. ?Keep all follow-up visits as told by your health care provider. This is important.   ?Contact a health care provider if: ?Your pain is not controlled with medicine. ?You have a fever. ?You have more redness, swelling, or pain around the puncture site. ?You have fluid or blood coming from the puncture site. ?Your puncture site feels warm to the touch. ?You have pus or a bad smell coming from the puncture site. ?Summary ?After the procedure, it is common to have mild pain, tenderness, swelling, and bruising. ?Follow instructions from your health care provider about how to take care of the puncture site and what activities are safe for you. ?Take over-the-counter and prescription medicines only as told by your health care provider. ?Contact a health care provider if you have any signs of infection, such as fluid or blood coming from the puncture site. ?This information is not intended to replace advice given to you by your health care provider. Make sure you discuss any questions you have with your health care provider. ?Document Revised: 07/02/2018 Document Reviewed: 07/02/2018 ?Elsevier Patient Education ? 2021 Elsevier Inc. ? ? ?Moderate Conscious Sedation, Adult, Care After ?This sheet gives you information about how to care for yourself after your procedure. Your health care provider may also give you more specific instructions. If you have problems or questions, contact your health care provider. ?What can I expect after the procedure? ?After the procedure, it is common to have: ?Sleepiness for several hours. ?Impaired judgment for several hours. ?Difficulty with balance. ?Vomiting if you eat too   soon. ?Follow these instructions at home: ?For the time period you were told by your health care provider: ?Rest. ?Do not participate in activities where you could fall or become injured. ?Do not drive or use machinery. ?Do not drink alcohol. ?Do not take sleeping pills or medicines that cause drowsiness. ?Do not  make important decisions or sign legal documents. ?Do not take care of children on your own.  ?  ?  ?Eating and drinking ?Follow the diet recommended by your health care provider. ?Drink enough fluid to keep your urine pale yellow. ?If you vomit: ?Drink water, juice, or soup when you can drink without vomiting. ?Make sure you have little or no nausea before eating solid foods.   ?General instructions ?Take over-the-counter and prescription medicines only as told by your health care provider. ?Have a responsible adult stay with you for the time you are told. It is important to have someone help care for you until you are awake and alert. ?Do not smoke. ?Keep all follow-up visits as told by your health care provider. This is important. ?Contact a health care provider if: ?You are still sleepy or having trouble with balance after 24 hours. ?You feel light-headed. ?You keep feeling nauseous or you keep vomiting. ?You develop a rash. ?You have a fever. ?You have redness or swelling around the IV site. ?Get help right away if: ?You have trouble breathing. ?You have new-onset confusion at home. ?Summary ?After the procedure, it is common to feel sleepy, have impaired judgment, or feel nauseous if you eat too soon. ?Rest after you get home. Know the things you should not do after the procedure. ?Follow the diet recommended by your health care provider and drink enough fluid to keep your urine pale yellow. ?Get help right away if you have trouble breathing or new-onset confusion at home. ?This information is not intended to replace advice given to you by your health care provider. Make sure you discuss any questions you have with your health care provider. ?Document Revised: 06/13/2019 Document Reviewed: 01/09/2019 ?Elsevier Patient Education ? 2021 Elsevier Inc.  ?

## 2022-05-08 NOTE — Procedures (Signed)
Interventional Radiology Procedure:   Indications: Persistent thrombocytopenia. History of non-Hodgkin's lymphoma   Procedure: Image guided bone marrow biopsy  Findings: 3 aspirates and 2 core from right ilium  Complications: None     EBL: Minimal, less than 10 ml  Plan: Discharge to home in one hour.   Claire Dolores R. Anselm Pancoast, MD  Pager: [phone]

## 2022-05-08 NOTE — Progress Notes (Signed)
Patient had a technically successful bone marrow biopsy this morning. Will follow for path results.   Oncology Nurse Navigator Documentation     05/08/2022   11:15 AM  Oncology Nurse Navigator Flowsheets  Navigator Follow Up Date: 05/12/2022  Navigator Follow Up Reason: Pathology  Navigator Location [facility]  Navigator Encounter Type Appt/Treatment Plan Review  Patient Visit Type MedOnc  Treatment Phase Active Tx  Barriers/Navigation Needs No Barriers At This Time  Interventions None Required  Acuity Level 1-No Barriers  Support Groups/Services Friends and Family  Time Spent with Patient 15

## 2022-05-08 NOTE — Sedation Documentation (Signed)
Sample #3 obtained 

## 2022-05-08 NOTE — Sedation Documentation (Addendum)
Sample #1 obtained 

## 2022-05-08 NOTE — Sedation Documentation (Signed)
Sample #4 obtained 

## 2022-05-08 NOTE — Sedation Documentation (Signed)
Sample #2 obtained 

## 2022-05-09 NOTE — Telephone Encounter (Signed)
Called to confirm Proof of Income was needed since electronic verification was selected on application. Informed by representative that was missed and was corrected while I was on the phone. Representative asked me to send a copy showing no open funding from [place] to [phone]. That has been sent to application can continue to process.   I will continue to follow and update until final determination.   Ariel [name], [facility] Patient [place]  [phone] (phone) [phone] (fax) 05/09/2022 9:30 AM

## 2022-05-10 ENCOUNTER — Inpatient Hospital Stay (HOSPITAL_BASED_OUTPATIENT_CLINIC_OR_DEPARTMENT_OTHER): Payer: Medicare Other | Admitting: Hematology & Oncology

## 2022-05-10 ENCOUNTER — Inpatient Hospital Stay: Payer: Medicare Other

## 2022-05-10 ENCOUNTER — Other Ambulatory Visit: Payer: Self-pay

## 2022-05-10 ENCOUNTER — Encounter: Payer: Self-pay | Admitting: Hematology & Oncology

## 2022-05-10 ENCOUNTER — Other Ambulatory Visit: Payer: Self-pay | Admitting: *Deleted

## 2022-05-10 ENCOUNTER — Other Ambulatory Visit (HOSPITAL_COMMUNITY): Payer: Self-pay

## 2022-05-10 VITALS — BP 137/65 | HR 99 | Temp 98.1°F | Resp 98 | Ht 66.0 in | Wt 143.0 lb

## 2022-05-10 DIAGNOSIS — C8510 Unspecified B-cell lymphoma, unspecified site: Secondary | ICD-10-CM

## 2022-05-10 DIAGNOSIS — C8339 Diffuse large B-cell lymphoma, extranodal and solid organ sites: Secondary | ICD-10-CM | POA: Diagnosis not present

## 2022-05-10 DIAGNOSIS — Z5111 Encounter for antineoplastic chemotherapy: Secondary | ICD-10-CM | POA: Diagnosis not present

## 2022-05-10 DIAGNOSIS — M62562 Muscle wasting and atrophy, not elsewhere classified, left lower leg: Secondary | ICD-10-CM | POA: Diagnosis not present

## 2022-05-10 DIAGNOSIS — M62561 Muscle wasting and atrophy, not elsewhere classified, right lower leg: Secondary | ICD-10-CM | POA: Diagnosis not present

## 2022-05-10 DIAGNOSIS — M62552 Muscle wasting and atrophy, not elsewhere classified, left thigh: Secondary | ICD-10-CM | POA: Diagnosis not present

## 2022-05-10 DIAGNOSIS — M62551 Muscle wasting and atrophy, not elsewhere classified, right thigh: Secondary | ICD-10-CM | POA: Diagnosis not present

## 2022-05-10 DIAGNOSIS — Z79899 Other long term (current) drug therapy: Secondary | ICD-10-CM | POA: Diagnosis not present

## 2022-05-10 DIAGNOSIS — R2681 Unsteadiness on feet: Secondary | ICD-10-CM | POA: Diagnosis not present

## 2022-05-10 LAB — SAVE SMEAR(SSMR), FOR PROVIDER SLIDE REVIEW

## 2022-05-10 LAB — CBC WITH DIFFERENTIAL (CANCER CENTER ONLY)
Abs Immature Granulocytes: 0.03 10*3/uL (ref 0.00–0.07)
Basophils Absolute: 0 10*3/uL (ref 0.0–0.1)
Basophils Relative: 0 %
Eosinophils Absolute: 0 10*3/uL (ref 0.0–0.5)
Eosinophils Relative: 1 %
HCT: 33.2 % — ABNORMAL LOW (ref 36.0–46.0)
Hemoglobin: 11.2 g/dL — ABNORMAL LOW (ref 12.0–15.0)
Immature Granulocytes: 1 %
Lymphocytes Relative: 41 %
Lymphs Abs: 2.2 10*3/uL (ref 0.7–4.0)
MCH: 31.9 pg (ref 26.0–34.0)
MCHC: 33.7 g/dL (ref 30.0–36.0)
MCV: 94.6 fL (ref 80.0–100.0)
Monocytes Absolute: 0.7 10*3/uL (ref 0.1–1.0)
Monocytes Relative: 13 %
Neutro Abs: 2.3 10*3/uL (ref 1.7–7.7)
Neutrophils Relative %: 44 %
Platelet Count: 121 10*3/uL — ABNORMAL LOW (ref 150–400)
RBC: 3.51 MIL/uL — ABNORMAL LOW (ref 3.87–5.11)
RDW: 19.3 % — ABNORMAL HIGH (ref 11.5–15.5)
WBC Count: 5.3 10*3/uL (ref 4.0–10.5)
nRBC: 0 % (ref 0.0–0.2)

## 2022-05-10 LAB — CMP (CANCER CENTER ONLY)
ALT: 16 U/L (ref 0–44)
AST: 18 U/L (ref 15–41)
Albumin: 4.5 g/dL (ref 3.5–5.0)
Alkaline Phosphatase: 57 U/L (ref 38–126)
Anion gap: 8 (ref 5–15)
BUN: 10 mg/dL (ref 8–23)
CO2: 30 mmol/L (ref 22–32)
Calcium: 10 mg/dL (ref 8.9–10.3)
Chloride: 100 mmol/L (ref 98–111)
Creatinine: 0.76 mg/dL (ref 0.44–1.00)
GFR, Estimated: >60 mL/min (ref >60)
Glucose, Bld: 100 mg/dL — ABNORMAL HIGH (ref 70–99)
Potassium: 4.2 mmol/L (ref 3.5–5.1)
Sodium: 138 mmol/L (ref 135–145)
Total Bilirubin: 0.6 mg/dL (ref 0.3–1.2)
Total Protein: 6.7 g/dL (ref 6.5–8.1)

## 2022-05-10 LAB — LACTATE DEHYDROGENASE: LDH: 176 U/L (ref 98–192)

## 2022-05-10 LAB — SURGICAL PATHOLOGY

## 2022-05-10 NOTE — Progress Notes (Signed)
Hematology and Oncology Follow Up Visit  Ariel [name] LT:[id number] 1955-06-13 67 y.o. 05/10/2022   Principle Diagnosis:  Diffuse large cell non-Hodgkin's lymphoma-CNS involvement -- relapsed  Current Therapy:   Status post chemotherapy with R-ICE/R-MTV --  s/p cycle #4 MATRIX -- s/p cycle #4 -- start on 05/02/2021 --completed on 08/06/2021 Neulasta 6 mg subcu post chemotherapy MTV- s/p cycle #1 -- given on 01/22/204 Imbruvica/Rituxan -- s/p cycle #1 -- start on 05/02/2022     Interim History:  Ariel [name] is comes in for follow-up.  As always, she looks fantastic.  She feels good.  She has had no problems with the IMBRUVICA.  She has had 1 cycle of the Rituxan.  She had a bone marrow test done 2 days ago.  I do not have the results back as of yet.  I am going to see if we cannot think about getting her to CAR-T therapy.  I really think this would be the best way to try to treat her long-term for the relapse of the lymphoma into the CNS.  Her platelet count has come up quite nicely now.  I suppose we could always consider getting her back in for methotrexate even though she did have a hard time with it.  She has had no problems with nausea or vomiting.  She has had no bleeding.  There is been no cough or shortness of breath.  She has had no rashes.  There is been no leg swelling.  She has had no issues with fever.  She has had no bleeding.  There is been no issues with bowels or bladder.  Overall, I would say that her performance status is ECOG 1.     Medications:  Current Outpatient Medications:    acetaminophen (TYLENOL) 325 MG tablet, Take 1,300 mg by mouth daily as needed for headache, fever, moderate pain or mild pain., Disp: , Rfl:    dronabinol (MARINOL) 2.5 MG capsule, Take 1 capsule (2.5 mg total) by mouth 2 (two) times daily before a meal., Disp: 60 capsule, Rfl: 2   feeding supplement (ENSURE ENLIVE / ENSURE PLUS) LIQD, Take 237 mLs by mouth 3 (three) times daily between  meals., Disp: 237 mL, Rfl: 0   ibrutinib (IMBRUVICA) 420 MG tablet, Take 1 tablet (420 mg total) by mouth daily. Take with a glass of water., Disp: 28 tablet, Rfl: 3   lidocaine-prilocaine (EMLA) cream, Apply to affected area once, Disp: 30 g, Rfl: 3   naphazoline-glycerin (CLEAR EYES REDNESS) 0.012-0.25 % SOLN, Place 2 drops into both eyes 4 (four) times daily as needed for eye irritation., Disp: 15 mL, Rfl: 0   polyethylene glycol (MIRALAX / GLYCOLAX) 17 g packet, Take 17 g by mouth daily as needed for mild constipation., Disp: 14 each, Rfl: 0 No current facility-administered medications for this visit.  Facility-Administered Medications Ordered in Other Visits:    sodium chloride flush (NS) 0.9 % injection 10 mL, 10 mL, Intravenous, PRN, Volanda Napoleon, MD, 10 mL at 10/04/21 0946  Allergies:  Allergies  Allergen Reactions   Decadron [Dexamethasone] Other (See Comments)    Leg weakness Hallucinations   Plavix [Clopidogrel] Hives, Swelling and Other (See Comments)    Lip swelling    Prednisone Other (See Comments)    Steroids caused psychological issues  Hallucinations   Codeine Nausea And Vomiting    Past Medical History, Surgical history, Social history, and Family History were reviewed and updated.  Review of Systems: Review of Systems  Constitutional:  Negative.   HENT:  Negative.    Eyes: Negative.   Respiratory: Negative.    Cardiovascular: Negative.   Gastrointestinal: Negative.   Endocrine: Negative.   Genitourinary: Negative.    Musculoskeletal: Negative.   Skin: Negative.   Neurological: Negative.   Hematological: Negative.   Psychiatric/Behavioral: Negative.      Physical Exam:  height is '5\' 6"'$  (1.676 m) and weight is 143 lb (64.9 kg). Her oral temperature is 98.1 F (36.7 C). Her blood pressure is 137/65 and her pulse is 99. Her respiration is 98 (abnormal) and oxygen saturation is 98%.   Wt Readings from Last 3 Encounters:  05/10/22 143 lb (64.9 kg)   05/08/22 147 lb 0.6 oz (66.7 kg)  05/03/22 147 lb 0.6 oz (66.7 kg)    Physical Exam Vitals reviewed.  HENT:     Head: Normocephalic and atraumatic.  Eyes:     Pupils: Pupils are equal, round, and reactive to light.  Cardiovascular:     Rate and Rhythm: Normal rate and regular rhythm.     Heart sounds: Normal heart sounds.     Comments: Cardiac exam is regular rate and rhythm with no murmurs, rubs or bruits. Pulmonary:     Effort: Pulmonary effort is normal.     Breath sounds: Normal breath sounds.  Abdominal:     General: Bowel sounds are normal.     Palpations: Abdomen is soft.  Musculoskeletal:        General: No tenderness or deformity. Normal range of motion.     Cervical back: Normal range of motion.     Comments: .  Lymphadenopathy:     Cervical: No cervical adenopathy.  Skin:    General: Skin is warm and dry.     Findings: No erythema or rash.  Neurological:     Mental Status: She is alert and oriented to person, place, and time.     Comments: Neurological exam shows no focal neurological deficits.  All cranial nerves are intact.  She has good strength in upper and lower extremities.  Psychiatric:        Behavior: Behavior normal.        Thought Content: Thought content normal.        Judgment: Judgment normal.     Lab Results  Component Value Date   WBC 5.3 05/10/2022   HGB 11.2 (L) 05/10/2022   HCT 33.2 (L) 05/10/2022   MCV 94.6 05/10/2022   PLT 121 (L) 05/10/2022     Chemistry      Component Value Date/Time   NA 138 05/10/2022 1240   K 4.2 05/10/2022 1240   CL 100 05/10/2022 1240   CO2 30 05/10/2022 1240   BUN 10 05/10/2022 1240   CREATININE 0.76 05/10/2022 1240   CREATININE 0.68 11/18/2012 1439      Component Value Date/Time   CALCIUM 10.0 05/10/2022 1240   ALKPHOS 57 05/10/2022 1240   AST 18 05/10/2022 1240   ALT 16 05/10/2022 1240   BILITOT 0.6 05/10/2022 1240      Impression and Plan: Ariel [name] is a very charming 67 year old white  female.  She initially came to [place] with iron deficiency anemia.  We subsequently found that she had diffuse large cell non-Hodgkin's lymphoma.  It had traveled to her brain.  She has had a total of 8 cycles of treatment.  She had 4 cycles of a combination protocol.  We then gave her 4 cycles of MATRIX.  She now has the  recurrence.  We had her in the hospital with high-dose methotrexate based therapy.  Again, she responded quite nicely.  Her MRI that she had did not show any evidence of residual disease after 1 cycle.  Again I want to see about doing CAR-T therapy.  She is on the Rituxan/Imbruvica.  I think she is done well with this.  She will come in next week for her second dose of Rituxan.  We also make sure that we narrowed down her medication that she gets at the assisted living facility.  There are medicines that she went home from the hospital with that she does not need right now.  I would like to see her back myself in another 3 weeks or so.    Volanda Napoleon, MD 3/13/20241:02 PM

## 2022-05-10 NOTE — Telephone Encounter (Signed)
Received another VM from [place] indicating that their [place] was not working and that they did require Proof of Income to be faxed in to them. I informed them that the patient was currently residing in a facility with no transportation and could not access her Social Security Benefits Letter to be able to send to them. Representative informed me we could send in a signed letter from physician stating her income. That was signed and faxed to [phone] as well as [phone] for processing. Advised by representative to check back in two business days to confirm application was continuing to process.   I will continue to follow and update until final determination.   Berdine Addison, [facility] Patient [place]  [phone] (phone) [phone] (fax) 05/10/2022 11:49 AM

## 2022-05-10 NOTE — Progress Notes (Signed)
Referral to [facility] entered.

## 2022-05-12 ENCOUNTER — Encounter: Payer: Self-pay | Admitting: *Deleted

## 2022-05-12 DIAGNOSIS — M62552 Muscle wasting and atrophy, not elsewhere classified, left thigh: Secondary | ICD-10-CM | POA: Diagnosis not present

## 2022-05-12 DIAGNOSIS — R2681 Unsteadiness on feet: Secondary | ICD-10-CM | POA: Diagnosis not present

## 2022-05-12 DIAGNOSIS — M62561 Muscle wasting and atrophy, not elsewhere classified, right lower leg: Secondary | ICD-10-CM | POA: Diagnosis not present

## 2022-05-12 DIAGNOSIS — M62551 Muscle wasting and atrophy, not elsewhere classified, right thigh: Secondary | ICD-10-CM | POA: Diagnosis not present

## 2022-05-12 DIAGNOSIS — M62562 Muscle wasting and atrophy, not elsewhere classified, left lower leg: Secondary | ICD-10-CM | POA: Diagnosis not present

## 2022-05-12 NOTE — Progress Notes (Signed)
Reviewed path with Dr Marin Olp. No additional needs at this time.   Oncology Nurse Navigator Documentation     05/12/2022    8:30 AM  Oncology Nurse Navigator Flowsheets  Navigator Follow Up Date: 05/31/2022  Navigator Follow Up Reason: Follow-up Appointment;Chemotherapy  Navigator Location [facility]  Navigator Encounter Type Pathology Review  Patient Visit Type MedOnc  Treatment Phase Active Tx  Barriers/Navigation Needs No Barriers At This Time  Interventions None Required  Acuity Level 1-No Barriers  Support Groups/Services Friends and Family  Time Spent with Patient 15

## 2022-05-15 NOTE — Telephone Encounter (Signed)
Oral Oncology Patient Advocate Encounter   Received notification that the application for assistance for Imbruvica through [facility] Assist Patient Assistance has been approved.   [employer]'s phone number [phone].   Effective dates: 05/12/22 through 02/27/23  I have spoken to the patient.   Berdine Addison, [facility] Patient [place]  [phone] (phone) [phone] (fax) 05/15/2022 8:09 AM

## 2022-05-15 NOTE — Telephone Encounter (Signed)
Patient knows to expect a call from [facility] to set up initial fill of Carter. They know to call [employer] at [phone] if they jave not heard from them by Thursday 05/18/22 and to call me at [phone] with any issues, questionis, or concerns regarding obtaining their medication.    Berdine Addison, [facility] Patient [place]  [phone] (phone) [phone] (fax) 05/15/2022 9:26 AM

## 2022-05-16 ENCOUNTER — Other Ambulatory Visit (HOSPITAL_COMMUNITY): Payer: Self-pay

## 2022-05-16 ENCOUNTER — Other Ambulatory Visit: Payer: Self-pay

## 2022-05-16 DIAGNOSIS — M62562 Muscle wasting and atrophy, not elsewhere classified, left lower leg: Secondary | ICD-10-CM | POA: Diagnosis not present

## 2022-05-16 DIAGNOSIS — C8510 Unspecified B-cell lymphoma, unspecified site: Secondary | ICD-10-CM

## 2022-05-16 DIAGNOSIS — R2681 Unsteadiness on feet: Secondary | ICD-10-CM | POA: Diagnosis not present

## 2022-05-16 DIAGNOSIS — M62552 Muscle wasting and atrophy, not elsewhere classified, left thigh: Secondary | ICD-10-CM | POA: Diagnosis not present

## 2022-05-16 DIAGNOSIS — M62551 Muscle wasting and atrophy, not elsewhere classified, right thigh: Secondary | ICD-10-CM | POA: Diagnosis not present

## 2022-05-16 DIAGNOSIS — M62561 Muscle wasting and atrophy, not elsewhere classified, right lower leg: Secondary | ICD-10-CM | POA: Diagnosis not present

## 2022-05-17 ENCOUNTER — Inpatient Hospital Stay: Payer: Medicare Other

## 2022-05-17 VITALS — BP 115/53 | HR 65 | Temp 97.9°F | Resp 18

## 2022-05-17 DIAGNOSIS — Z5111 Encounter for antineoplastic chemotherapy: Secondary | ICD-10-CM | POA: Diagnosis not present

## 2022-05-17 DIAGNOSIS — C8332 Diffuse large B-cell lymphoma, intrathoracic lymph nodes: Secondary | ICD-10-CM

## 2022-05-17 DIAGNOSIS — C8510 Unspecified B-cell lymphoma, unspecified site: Secondary | ICD-10-CM

## 2022-05-17 DIAGNOSIS — Z79899 Other long term (current) drug therapy: Secondary | ICD-10-CM | POA: Diagnosis not present

## 2022-05-17 DIAGNOSIS — C8339 Diffuse large B-cell lymphoma, extranodal and solid organ sites: Secondary | ICD-10-CM | POA: Diagnosis not present

## 2022-05-17 DIAGNOSIS — D500 Iron deficiency anemia secondary to blood loss (chronic): Secondary | ICD-10-CM

## 2022-05-17 LAB — CBC WITH DIFFERENTIAL (CANCER CENTER ONLY)
Abs Immature Granulocytes: 0.07 10*3/uL (ref 0.00–0.07)
Basophils Absolute: 0 10*3/uL (ref 0.0–0.1)
Basophils Relative: 1 %
Eosinophils Absolute: 0 10*3/uL (ref 0.0–0.5)
Eosinophils Relative: 1 %
HCT: 33.6 % — ABNORMAL LOW (ref 36.0–46.0)
Hemoglobin: 11.4 g/dL — ABNORMAL LOW (ref 12.0–15.0)
Immature Granulocytes: 2 %
Lymphocytes Relative: 37 %
Lymphs Abs: 1.6 10*3/uL (ref 0.7–4.0)
MCH: 32 pg (ref 26.0–34.0)
MCHC: 33.9 g/dL (ref 30.0–36.0)
MCV: 94.4 fL (ref 80.0–100.0)
Monocytes Absolute: 0.8 10*3/uL (ref 0.1–1.0)
Monocytes Relative: 20 %
Neutro Abs: 1.8 10*3/uL (ref 1.7–7.7)
Neutrophils Relative %: 39 %
Platelet Count: 107 10*3/uL — ABNORMAL LOW (ref 150–400)
RBC: 3.56 MIL/uL — ABNORMAL LOW (ref 3.87–5.11)
RDW: 18.6 % — ABNORMAL HIGH (ref 11.5–15.5)
WBC Count: 4.3 10*3/uL (ref 4.0–10.5)
nRBC: 0 % (ref 0.0–0.2)

## 2022-05-17 LAB — CMP (CANCER CENTER ONLY)
ALT: 21 U/L (ref 0–44)
AST: 21 U/L (ref 15–41)
Albumin: 4.4 g/dL (ref 3.5–5.0)
Alkaline Phosphatase: 54 U/L (ref 38–126)
Anion gap: 8 (ref 5–15)
BUN: 12 mg/dL (ref 8–23)
CO2: 30 mmol/L (ref 22–32)
Calcium: 9.9 mg/dL (ref 8.9–10.3)
Chloride: 98 mmol/L (ref 98–111)
Creatinine: 0.61 mg/dL (ref 0.44–1.00)
GFR, Estimated: >60 mL/min (ref >60)
Glucose, Bld: 111 mg/dL — ABNORMAL HIGH (ref 70–99)
Potassium: 4 mmol/L (ref 3.5–5.1)
Sodium: 136 mmol/L (ref 135–145)
Total Bilirubin: 0.5 mg/dL (ref 0.3–1.2)
Total Protein: 6.4 g/dL — ABNORMAL LOW (ref 6.5–8.1)

## 2022-05-17 LAB — SAMPLE TO BLOOD BANK

## 2022-05-17 MED ORDER — DIPHENHYDRAMINE HCL 50 MG/ML IJ SOLN: 50.0000 mg | Freq: Once | INTRAMUSCULAR | Status: DC | PRN

## 2022-05-17 MED ORDER — METHYLPREDNISOLONE SODIUM SUCC 125 MG IJ SOLR: 125.0000 mg | Freq: Once | INTRAMUSCULAR | Status: DC | PRN

## 2022-05-17 MED ORDER — ALBUTEROL SULFATE (2.5 MG/3ML) 0.083% IN NEBU: 2.5000 mg | INHALATION_SOLUTION | Freq: Once | RESPIRATORY_TRACT | Status: DC | PRN

## 2022-05-17 MED ORDER — ACETAMINOPHEN 325 MG PO TABS: 650.0000 mg | ORAL_TABLET | Freq: Once | ORAL | Status: DC

## 2022-05-17 MED ORDER — SODIUM CHLORIDE 0.9 % IV SOLN
500.0000 mg/m2 | Freq: Once | INTRAVENOUS | Status: AC
  Administered 2022-05-17: 900 mg via INTRAVENOUS
  Filled 2022-05-17: qty 50

## 2022-05-17 MED ORDER — SODIUM CHLORIDE 0.9 % IV SOLN: Freq: Once | INTRAVENOUS | Status: AC

## 2022-05-17 MED ORDER — DIPHENHYDRAMINE HCL 25 MG PO CAPS: 50.0000 mg | ORAL_CAPSULE | Freq: Once | ORAL | Status: DC

## 2022-05-17 MED ORDER — HEPARIN SOD (PORK) LOCK FLUSH 100 UNIT/ML IV SOLN
500.0000 [IU] | Freq: Once | INTRAVENOUS | Status: AC | PRN
  Administered 2022-05-17: 500 [IU]

## 2022-05-17 MED ORDER — SODIUM CHLORIDE 0.9% FLUSH
10.0000 mL | INTRAVENOUS | Status: DC | PRN
  Administered 2022-05-17: 10 mL

## 2022-05-17 MED ORDER — ALTEPLASE 2 MG IJ SOLR: 2.0000 mg | Freq: Once | INTRAMUSCULAR | Status: DC | PRN

## 2022-05-17 MED ORDER — HEPARIN SOD (PORK) LOCK FLUSH 100 UNIT/ML IV SOLN: 250.0000 [IU] | Freq: Once | INTRAVENOUS | Status: DC | PRN

## 2022-05-17 MED ORDER — SODIUM CHLORIDE 0.9% FLUSH
10.0000 mL | Freq: Once | INTRAVENOUS | Status: AC
  Administered 2022-05-17: 10 mL

## 2022-05-17 MED ORDER — SODIUM CHLORIDE 0.9 % IV SOLN: Freq: Once | INTRAVENOUS | Status: DC | PRN

## 2022-05-17 MED ORDER — SODIUM CHLORIDE 0.9% FLUSH: 3.0000 mL | Freq: Once | INTRAVENOUS | Status: DC | PRN

## 2022-05-17 MED ORDER — PEGFILGRASTIM-BMEZ 6 MG/0.6ML ~~LOC~~ SOSY: 6.0000 mg | PREFILLED_SYRINGE | Freq: Once | SUBCUTANEOUS | Status: DC

## 2022-05-17 MED ORDER — EPINEPHRINE 0.3 MG/0.3ML IJ SOAJ: 0.3000 mg | Freq: Once | INTRAMUSCULAR | Status: DC | PRN

## 2022-05-17 MED ORDER — FAMOTIDINE IN NACL 20-0.9 MG/50ML-% IV SOLN: 20.0000 mg | Freq: Once | INTRAVENOUS | Status: DC | PRN

## 2022-05-17 NOTE — Patient Instructions (Signed)
[facility]  Discharge Instructions: Thank you for choosing [facility] to provide your oncology and hematology care.   If you have a lab appointment with the Cancer Center, please go directly to the Cancer Center and check in at the registration area.  Wear comfortable clothing and clothing appropriate for easy access to any Portacath or PICC line.   We strive to give you quality time with your provider. You may need to reschedule your appointment if you arrive late (15 or more minutes).  Arriving late affects you and other patients whose appointments are after yours.  Also, if you miss three or more appointments without notifying the office, you may be dismissed from the clinic at the provider's discretion.      For prescription refill requests, have your pharmacy contact our office and allow 72 hours for refills to be completed.    Today you received the following chemotherapy and/or immunotherapy agents Rituxan      To help prevent nausea and vomiting after your treatment, we encourage you to take your nausea medication as directed.  BELOW ARE SYMPTOMS THAT SHOULD BE REPORTED IMMEDIATELY: *FEVER GREATER THAN 100.4 F (38 C) OR HIGHER *CHILLS OR SWEATING *NAUSEA AND VOMITING THAT IS NOT CONTROLLED WITH YOUR NAUSEA MEDICATION *UNUSUAL SHORTNESS OF BREATH *UNUSUAL BRUISING OR BLEEDING *URINARY PROBLEMS (pain or burning when urinating, or frequent urination) *BOWEL PROBLEMS (unusual diarrhea, constipation, pain near the anus) TENDERNESS IN MOUTH AND THROAT WITH OR WITHOUT PRESENCE OF ULCERS (sore throat, sores in mouth, or a toothache) UNUSUAL RASH, SWELLING OR PAIN  UNUSUAL VAGINAL DISCHARGE OR ITCHING   Items with * indicate a potential emergency and should be followed up as soon as possible or go to the Emergency Department if any problems should occur.  Please show the CHEMOTHERAPY ALERT CARD or IMMUNOTHERAPY ALERT CARD at check-in  to the Emergency Department and triage nurse. Should you have questions after your visit or need to cancel or reschedule your appointment, please contact [facility]  [phone] and follow the prompts.  Office hours are 8:00 a.m. to 4:30 p.m. Monday - Friday. Please note that voicemails left after 4:00 p.m. may not be returned until the following business day.  We are closed weekends and major holidays. You have access to a nurse at all times for urgent questions. Please call the main number to the clinic [phone] and follow the prompts.  For any non-urgent questions, you may also contact your provider using MyChart. We now offer e-Visits for anyone 18 and older to request care online for non-urgent symptoms. For details visit [email or web address].   Also download the MyChart app! Go to the app store, search "MyChart", open the app, select , and log in with your MyChart username and password.   

## 2022-05-17 NOTE — Patient Instructions (Signed)

## 2022-05-18 DIAGNOSIS — M62562 Muscle wasting and atrophy, not elsewhere classified, left lower leg: Secondary | ICD-10-CM | POA: Diagnosis not present

## 2022-05-18 DIAGNOSIS — M62561 Muscle wasting and atrophy, not elsewhere classified, right lower leg: Secondary | ICD-10-CM | POA: Diagnosis not present

## 2022-05-18 DIAGNOSIS — M62551 Muscle wasting and atrophy, not elsewhere classified, right thigh: Secondary | ICD-10-CM | POA: Diagnosis not present

## 2022-05-18 DIAGNOSIS — M62552 Muscle wasting and atrophy, not elsewhere classified, left thigh: Secondary | ICD-10-CM | POA: Diagnosis not present

## 2022-05-18 DIAGNOSIS — R2681 Unsteadiness on feet: Secondary | ICD-10-CM | POA: Diagnosis not present

## 2022-05-19 DIAGNOSIS — M1712 Unilateral primary osteoarthritis, left knee: Secondary | ICD-10-CM | POA: Diagnosis not present

## 2022-05-19 DIAGNOSIS — M1711 Unilateral primary osteoarthritis, right knee: Secondary | ICD-10-CM | POA: Diagnosis not present

## 2022-05-19 DIAGNOSIS — M170 Bilateral primary osteoarthritis of knee: Secondary | ICD-10-CM | POA: Diagnosis not present

## 2022-05-22 ENCOUNTER — Encounter: Payer: Self-pay | Admitting: Hematology & Oncology

## 2022-05-23 DIAGNOSIS — M62551 Muscle wasting and atrophy, not elsewhere classified, right thigh: Secondary | ICD-10-CM | POA: Diagnosis not present

## 2022-05-23 DIAGNOSIS — M62561 Muscle wasting and atrophy, not elsewhere classified, right lower leg: Secondary | ICD-10-CM | POA: Diagnosis not present

## 2022-05-23 DIAGNOSIS — M62562 Muscle wasting and atrophy, not elsewhere classified, left lower leg: Secondary | ICD-10-CM | POA: Diagnosis not present

## 2022-05-23 DIAGNOSIS — M62552 Muscle wasting and atrophy, not elsewhere classified, left thigh: Secondary | ICD-10-CM | POA: Diagnosis not present

## 2022-05-23 DIAGNOSIS — R2681 Unsteadiness on feet: Secondary | ICD-10-CM | POA: Diagnosis not present

## 2022-05-25 DIAGNOSIS — M62552 Muscle wasting and atrophy, not elsewhere classified, left thigh: Secondary | ICD-10-CM | POA: Diagnosis not present

## 2022-05-25 DIAGNOSIS — M62562 Muscle wasting and atrophy, not elsewhere classified, left lower leg: Secondary | ICD-10-CM | POA: Diagnosis not present

## 2022-05-25 DIAGNOSIS — M62561 Muscle wasting and atrophy, not elsewhere classified, right lower leg: Secondary | ICD-10-CM | POA: Diagnosis not present

## 2022-05-25 DIAGNOSIS — R2681 Unsteadiness on feet: Secondary | ICD-10-CM | POA: Diagnosis not present

## 2022-05-25 DIAGNOSIS — M62551 Muscle wasting and atrophy, not elsewhere classified, right thigh: Secondary | ICD-10-CM | POA: Diagnosis not present

## 2022-05-26 DIAGNOSIS — R2681 Unsteadiness on feet: Secondary | ICD-10-CM | POA: Diagnosis not present

## 2022-05-26 DIAGNOSIS — M62552 Muscle wasting and atrophy, not elsewhere classified, left thigh: Secondary | ICD-10-CM | POA: Diagnosis not present

## 2022-05-26 DIAGNOSIS — M62561 Muscle wasting and atrophy, not elsewhere classified, right lower leg: Secondary | ICD-10-CM | POA: Diagnosis not present

## 2022-05-26 DIAGNOSIS — M62562 Muscle wasting and atrophy, not elsewhere classified, left lower leg: Secondary | ICD-10-CM | POA: Diagnosis not present

## 2022-05-26 DIAGNOSIS — M62551 Muscle wasting and atrophy, not elsewhere classified, right thigh: Secondary | ICD-10-CM | POA: Diagnosis not present

## 2022-05-28 DIAGNOSIS — M62561 Muscle wasting and atrophy, not elsewhere classified, right lower leg: Secondary | ICD-10-CM | POA: Diagnosis not present

## 2022-05-28 DIAGNOSIS — R2681 Unsteadiness on feet: Secondary | ICD-10-CM | POA: Diagnosis not present

## 2022-05-28 DIAGNOSIS — M62551 Muscle wasting and atrophy, not elsewhere classified, right thigh: Secondary | ICD-10-CM | POA: Diagnosis not present

## 2022-05-28 DIAGNOSIS — M62562 Muscle wasting and atrophy, not elsewhere classified, left lower leg: Secondary | ICD-10-CM | POA: Diagnosis not present

## 2022-05-28 DIAGNOSIS — M62552 Muscle wasting and atrophy, not elsewhere classified, left thigh: Secondary | ICD-10-CM | POA: Diagnosis not present

## 2022-05-30 ENCOUNTER — Other Ambulatory Visit: Payer: Self-pay

## 2022-05-30 ENCOUNTER — Telehealth: Payer: Self-pay

## 2022-05-30 NOTE — Telephone Encounter (Signed)
Pt called stating she has had a sore throat since Saturday, states she feels like its just allergies, has not tried anything over the counter yet as she wanted to check with Dr.Ennever first. No redness or sores in throat. States it does feel a little tight on the right side. Discussed with Dr.Ennever who recommends trying an over the counter allery medication first and to call if it worsens. Pt states she will try it today and will be in office tomorrow.

## 2022-05-31 ENCOUNTER — Inpatient Hospital Stay: Payer: Medicare Other | Attending: Internal Medicine

## 2022-05-31 ENCOUNTER — Encounter: Payer: Self-pay | Admitting: *Deleted

## 2022-05-31 ENCOUNTER — Telehealth: Payer: Self-pay | Admitting: *Deleted

## 2022-05-31 ENCOUNTER — Inpatient Hospital Stay: Payer: Medicare Other

## 2022-05-31 ENCOUNTER — Inpatient Hospital Stay: Payer: Medicare Other | Admitting: Family

## 2022-05-31 VITALS — BP 110/51 | HR 75 | Temp 98.5°F | Resp 17 | Ht 66.0 in | Wt 147.0 lb

## 2022-05-31 VITALS — BP 116/42 | HR 71 | Resp 17

## 2022-05-31 DIAGNOSIS — C8510 Unspecified B-cell lymphoma, unspecified site: Secondary | ICD-10-CM

## 2022-05-31 DIAGNOSIS — Z5111 Encounter for antineoplastic chemotherapy: Secondary | ICD-10-CM | POA: Insufficient documentation

## 2022-05-31 DIAGNOSIS — D509 Iron deficiency anemia, unspecified: Secondary | ICD-10-CM | POA: Diagnosis not present

## 2022-05-31 DIAGNOSIS — C8332 Diffuse large B-cell lymphoma, intrathoracic lymph nodes: Secondary | ICD-10-CM

## 2022-05-31 DIAGNOSIS — Z79899 Other long term (current) drug therapy: Secondary | ICD-10-CM | POA: Diagnosis not present

## 2022-05-31 DIAGNOSIS — C8339 Diffuse large B-cell lymphoma, extranodal and solid organ sites: Secondary | ICD-10-CM | POA: Insufficient documentation

## 2022-05-31 LAB — CBC WITH DIFFERENTIAL (CANCER CENTER ONLY)
Abs Immature Granulocytes: 0.07 10*3/uL (ref 0.00–0.07)
Basophils Absolute: 0.1 10*3/uL (ref 0.0–0.1)
Basophils Relative: 1 %
Eosinophils Absolute: 0 10*3/uL (ref 0.0–0.5)
Eosinophils Relative: 0 %
HCT: 34.1 % — ABNORMAL LOW (ref 36.0–46.0)
Hemoglobin: 11.4 g/dL — ABNORMAL LOW (ref 12.0–15.0)
Immature Granulocytes: 1 %
Lymphocytes Relative: 23 %
Lymphs Abs: 2.5 10*3/uL (ref 0.7–4.0)
MCH: 32.6 pg (ref 26.0–34.0)
MCHC: 33.4 g/dL (ref 30.0–36.0)
MCV: 97.4 fL (ref 80.0–100.0)
Monocytes Absolute: 1.3 10*3/uL — ABNORMAL HIGH (ref 0.1–1.0)
Monocytes Relative: 12 %
Neutro Abs: 6.9 10*3/uL (ref 1.7–7.7)
Neutrophils Relative %: 63 %
Platelet Count: 125 10*3/uL — ABNORMAL LOW (ref 150–400)
RBC: 3.5 MIL/uL — ABNORMAL LOW (ref 3.87–5.11)
RDW: 17.2 % — ABNORMAL HIGH (ref 11.5–15.5)
WBC Count: 10.8 10*3/uL — ABNORMAL HIGH (ref 4.0–10.5)
nRBC: 0 % (ref 0.0–0.2)

## 2022-05-31 LAB — CMP (CANCER CENTER ONLY)
ALT: 16 U/L (ref 0–44)
AST: 13 U/L — ABNORMAL LOW (ref 15–41)
Albumin: 4.3 g/dL (ref 3.5–5.0)
Alkaline Phosphatase: 50 U/L (ref 38–126)
Anion gap: 9 (ref 5–15)
BUN: 11 mg/dL (ref 8–23)
CO2: 29 mmol/L (ref 22–32)
Calcium: 9.9 mg/dL (ref 8.9–10.3)
Chloride: 100 mmol/L (ref 98–111)
Creatinine: 0.72 mg/dL (ref 0.44–1.00)
GFR, Estimated: >60 mL/min (ref >60)
Glucose, Bld: 115 mg/dL — ABNORMAL HIGH (ref 70–99)
Potassium: 4 mmol/L (ref 3.5–5.1)
Sodium: 138 mmol/L (ref 135–145)
Total Bilirubin: 0.7 mg/dL (ref 0.3–1.2)
Total Protein: 6.7 g/dL (ref 6.5–8.1)

## 2022-05-31 LAB — LACTATE DEHYDROGENASE: LDH: 163 U/L (ref 98–192)

## 2022-05-31 MED ORDER — ACETAMINOPHEN 325 MG PO TABS: 650.0000 mg | ORAL_TABLET | Freq: Once | ORAL | Status: DC

## 2022-05-31 MED ORDER — SODIUM CHLORIDE 0.9 % IV SOLN
500.0000 mg/m2 | Freq: Once | INTRAVENOUS | Status: AC
  Administered 2022-05-31: 900 mg via INTRAVENOUS
  Filled 2022-05-31: qty 50

## 2022-05-31 MED ORDER — SODIUM CHLORIDE 0.9% FLUSH
10.0000 mL | INTRAVENOUS | Status: DC | PRN
  Administered 2022-05-31: 10 mL

## 2022-05-31 MED ORDER — DIPHENHYDRAMINE HCL 25 MG PO CAPS: 50.0000 mg | ORAL_CAPSULE | Freq: Once | ORAL | Status: DC

## 2022-05-31 MED ORDER — SODIUM CHLORIDE 0.9 % IV SOLN: Freq: Once | INTRAVENOUS | Status: AC

## 2022-05-31 MED ORDER — HEPARIN SOD (PORK) LOCK FLUSH 100 UNIT/ML IV SOLN
500.0000 [IU] | Freq: Once | INTRAVENOUS | Status: AC | PRN
  Administered 2022-05-31: 500 [IU]

## 2022-05-31 NOTE — Patient Instructions (Signed)

## 2022-05-31 NOTE — Telephone Encounter (Signed)
[facility] T therapy department.  Dr Jerolyn Shin [phone]) to follow up on patients CAR T therapy appointment because patient had not been contacted yet.  Left message on voicemail.  Referral that was sent on 3.13.24 by this office was resent by Leonard Downing today.

## 2022-05-31 NOTE — Telephone Encounter (Signed)
Faxed referral to Dr. Caprice Beaver @ [facility] @ [phone]

## 2022-05-31 NOTE — Progress Notes (Signed)
Patient is doing well with her current treatment. The referral placed to [facility] at her last visit was not submitted. Will be reentered today and sent to [facility] for consideration of CAR-T.  Oncology Nurse Navigator Documentation     05/31/2022    9:30 AM  Oncology Nurse Navigator Flowsheets  Navigator Follow Up Date: 06/14/2022  Navigator Follow Up Reason: Follow-up Appointment;Chemotherapy  Navigator Location [facility]  Navigator Encounter Type Appt/Treatment Plan Review  Patient Visit Type MedOnc  Treatment Phase Active Tx  Barriers/Navigation Needs No Barriers At This Time  Interventions Referrals  Acuity Level 1-No Barriers  Referrals Medical Oncology  Support Groups/Services Friends and Family  Time Spent with Patient 15

## 2022-05-31 NOTE — Progress Notes (Addendum)
Hematology and Oncology Follow Up Visit  Ariel [name] [id number] 1955/08/21 67 y.o. 05/31/2022   Principle Diagnosis:  Diffuse large cell non-Hodgkin's lymphoma-CNS involvement -- relapsed  Past Therapy: Status post chemotherapy with R-ICE/R-MTV --  s/p cycle #4 MATRIX -- s/p cycle #4 -- start on 05/02/2021 --completed on 08/06/2021 Neulasta 6 mg subcu post chemotherapy MTV- s/p cycle #1 -- given on 01/22/204   Current Therapy:        Imbruvica/Rituxan -- started 05/02/2022, s/p cycle 2   Interim History:  Ariel [name] is here today for follow-up and treatment. She is doing very well at this time. She states that she is doing PT 2-3 times a week at her assisted living facility. This has improved her energy and stamina.  She is ambulating now with a cane for added support.  She still has not heard from [facility] regarding establishing care for CAR-T therapy.  Her sore throat is better. No fever, chills, n/v, cough, rash, dizziness, SOB, chest pain, palpitations, abdominal pain or changes in bowel or bladder habits.  No swelling, tenderness, numbness or tingling in her extremities at this time. She occasionally noted tingling in her hands and feet. This comes and goes.   No falls or syncope reported.  Appetite and hydration are great. Her weight is stable at 147 lbs.   ECOG Performance Status: 1 - Symptomatic but completely ambulatory  Medications:  Allergies as of 05/31/2022       Reactions   Decadron [dexamethasone] Other (See Comments)   Leg weakness Hallucinations   Plavix [clopidogrel] Hives, Swelling, Other (See Comments)   Lip swelling   Prednisone Other (See Comments)   Steroids caused psychological issues  Hallucinations   Codeine Nausea And Vomiting        Medication List        Accurate as of May 31, 2022  9:11 AM. If you have any questions, ask your nurse or doctor.          acetaminophen 325 MG tablet Commonly known as: TYLENOL Take 1,300 mg by mouth daily  as needed for headache, fever, moderate pain or mild pain.   dronabinol 2.5 MG capsule Commonly known as: MARINOL Take 1 capsule (2.5 mg total) by mouth 2 (two) times daily before a meal.   feeding supplement Liqd Take 237 mLs by mouth 3 (three) times daily between meals.   Imbruvica 420 MG tablet Generic drug: ibrutinib Take 1 tablet (420 mg total) by mouth daily. Take with a glass of water.   lidocaine-prilocaine cream Commonly known as: EMLA Apply to affected area once   naphazoline-glycerin 0.012-0.25 % Soln Commonly known as: CLEAR EYES REDNESS Place 2 drops into both eyes 4 (four) times daily as needed for eye irritation.   polyethylene glycol 17 g packet Commonly known as: MIRALAX / GLYCOLAX Take 17 g by mouth daily as needed for mild constipation.        Allergies:  Allergies  Allergen Reactions   Decadron [Dexamethasone] Other (See Comments)    Leg weakness Hallucinations   Plavix [Clopidogrel] Hives, Swelling and Other (See Comments)    Lip swelling    Prednisone Other (See Comments)    Steroids caused psychological issues  Hallucinations   Codeine Nausea And Vomiting    Past Medical History, Surgical history, Social history, and Family History were reviewed and updated.  Review of Systems: All other 10 point review of systems is negative.   Physical Exam:  height is 5\' 6"  (1.676 m) and weight  is 147 lb (66.7 kg). Her oral temperature is 98.5 F (36.9 C). Her blood pressure is 110/51 (abnormal) and her pulse is 75. Her respiration is 17 and oxygen saturation is 99%.   Wt Readings from Last 3 Encounters:  05/31/22 147 lb (66.7 kg)  05/10/22 143 lb (64.9 kg)  05/08/22 147 lb 0.6 oz (66.7 kg)    Ocular: Sclerae unicteric, pupils equal, round and reactive to light Ear-nose-throat: Oropharynx clear, dentition fair Lymphatic: No cervical or supraclavicular adenopathy Lungs no rales or rhonchi, good excursion bilaterally Heart regular rate and rhythm,  no murmur appreciated Abd soft, nontender, positive bowel sounds MSK no focal spinal tenderness, no joint edema Neuro: non-focal, well-oriented, appropriate affect Breasts: Deferred   Lab Results  Component Value Date   WBC 10.8 (H) 05/31/2022   HGB 11.4 (L) 05/31/2022   HCT 34.1 (L) 05/31/2022   MCV 97.4 05/31/2022   PLT 125 (L) 05/31/2022   Lab Results  Component Value Date   FERRITIN 2,051 (H) 04/14/2022   IRON 40 04/05/2022   TIBC 118 (L) 04/05/2022   UIBC 78 04/05/2022   IRONPCTSAT 34 (H) 04/05/2022   Lab Results  Component Value Date   RETICCTPCT 2.4 11/15/2021   RBC 3.50 (L) 05/31/2022   No results found for: "KPAFRELGTCHN", "LAMBDASER", "KAPLAMBRATIO" No results found for: "IGGSERUM", "IGA", "IGMSERUM" No results found for: "TOTALPROTELP", "ALBUMINELP", "A1GS", "A2GS", "BETS", "BETA2SER", "GAMS", "MSPIKE", "SPEI"   Chemistry      Component Value Date/Time   NA 136 05/17/2022 0905   K 4.0 05/17/2022 0905   CL 98 05/17/2022 0905   CO2 30 05/17/2022 0905   BUN 12 05/17/2022 0905   CREATININE 0.61 05/17/2022 0905   CREATININE 0.68 11/18/2012 1439      Component Value Date/Time   CALCIUM 9.9 05/17/2022 0905   ALKPHOS 54 05/17/2022 0905   AST 21 05/17/2022 0905   ALT 21 05/17/2022 0905   BILITOT 0.5 05/17/2022 0905       Impression and Plan: Ariel [name] is a very pleasant 67 yo caucasian female with iron deficiency anemia and then was diagnosed with diffuse large cell non-Hodgkin's lymphoma that had traveled to her brain. She completed a total of 8 cycles of treatment with 4 cycles of a combination protocol followed by 4 cycles of MATRIX. She now has the recurrence and received high-dose methotrexate based therapy.  She responded quite nicely and MRI did not show any evidence of residual disease after 1 cycle. The goal is to get her to [facility] for CAR[facility]T therapy. She is currently on Rituxan/Imbruvica and tolerating nicely.  We will proceed with cycle 3/4 today as  planned.  We will reach back out to [facility] to see about getting her scheduled with them.  Follow-up in 2 weeks.   Ariel Dawson, NP 4/3/20249:11 AM

## 2022-05-31 NOTE — Addendum Note (Signed)
Addended by: Volanda Napoleon on: 05/31/2022 09:53 AM   Modules accepted: Orders

## 2022-05-31 NOTE — Patient Instructions (Signed)
[facility]  Discharge Instructions: Thank you for choosing [facility] to provide your oncology and hematology care.   If you have a lab appointment with the Cancer Center, please go directly to the Cancer Center and check in at the registration area.  Wear comfortable clothing and clothing appropriate for easy access to any Portacath or PICC line.   We strive to give you quality time with your provider. You may need to reschedule your appointment if you arrive late (15 or more minutes).  Arriving late affects you and other patients whose appointments are after yours.  Also, if you miss three or more appointments without notifying the office, you may be dismissed from the clinic at the provider's discretion.      For prescription refill requests, have your pharmacy contact our office and allow 72 hours for refills to be completed.    Today you received the following chemotherapy and/or immunotherapy agents Rituxan      To help prevent nausea and vomiting after your treatment, we encourage you to take your nausea medication as directed.  BELOW ARE SYMPTOMS THAT SHOULD BE REPORTED IMMEDIATELY: *FEVER GREATER THAN 100.4 F (38 C) OR HIGHER *CHILLS OR SWEATING *NAUSEA AND VOMITING THAT IS NOT CONTROLLED WITH YOUR NAUSEA MEDICATION *UNUSUAL SHORTNESS OF BREATH *UNUSUAL BRUISING OR BLEEDING *URINARY PROBLEMS (pain or burning when urinating, or frequent urination) *BOWEL PROBLEMS (unusual diarrhea, constipation, pain near the anus) TENDERNESS IN MOUTH AND THROAT WITH OR WITHOUT PRESENCE OF ULCERS (sore throat, sores in mouth, or a toothache) UNUSUAL RASH, SWELLING OR PAIN  UNUSUAL VAGINAL DISCHARGE OR ITCHING   Items with * indicate a potential emergency and should be followed up as soon as possible or go to the Emergency Department if any problems should occur.  Please show the CHEMOTHERAPY ALERT CARD or IMMUNOTHERAPY ALERT CARD at check-in  to the Emergency Department and triage nurse. Should you have questions after your visit or need to cancel or reschedule your appointment, please contact [facility]  [phone] and follow the prompts.  Office hours are 8:00 a.m. to 4:30 p.m. Monday - Friday. Please note that voicemails left after 4:00 p.m. may not be returned until the following business day.  We are closed weekends and major holidays. You have access to a nurse at all times for urgent questions. Please call the main number to the clinic [phone] and follow the prompts.  For any non-urgent questions, you may also contact your provider using MyChart. We now offer e-Visits for anyone 18 and older to request care online for non-urgent symptoms. For details visit [email or web address].   Also download the MyChart app! Go to the app store, search "MyChart", open the app, select , and log in with your MyChart username and password.   

## 2022-06-01 DIAGNOSIS — R2681 Unsteadiness on feet: Secondary | ICD-10-CM | POA: Diagnosis not present

## 2022-06-01 DIAGNOSIS — M62552 Muscle wasting and atrophy, not elsewhere classified, left thigh: Secondary | ICD-10-CM | POA: Diagnosis not present

## 2022-06-01 DIAGNOSIS — M62561 Muscle wasting and atrophy, not elsewhere classified, right lower leg: Secondary | ICD-10-CM | POA: Diagnosis not present

## 2022-06-01 DIAGNOSIS — M62562 Muscle wasting and atrophy, not elsewhere classified, left lower leg: Secondary | ICD-10-CM | POA: Diagnosis not present

## 2022-06-01 DIAGNOSIS — M62551 Muscle wasting and atrophy, not elsewhere classified, right thigh: Secondary | ICD-10-CM | POA: Diagnosis not present

## 2022-06-06 DIAGNOSIS — M62551 Muscle wasting and atrophy, not elsewhere classified, right thigh: Secondary | ICD-10-CM | POA: Diagnosis not present

## 2022-06-06 DIAGNOSIS — R2681 Unsteadiness on feet: Secondary | ICD-10-CM | POA: Diagnosis not present

## 2022-06-06 DIAGNOSIS — M62561 Muscle wasting and atrophy, not elsewhere classified, right lower leg: Secondary | ICD-10-CM | POA: Diagnosis not present

## 2022-06-06 DIAGNOSIS — M62552 Muscle wasting and atrophy, not elsewhere classified, left thigh: Secondary | ICD-10-CM | POA: Diagnosis not present

## 2022-06-06 DIAGNOSIS — M62562 Muscle wasting and atrophy, not elsewhere classified, left lower leg: Secondary | ICD-10-CM | POA: Diagnosis not present

## 2022-06-07 DIAGNOSIS — M62551 Muscle wasting and atrophy, not elsewhere classified, right thigh: Secondary | ICD-10-CM | POA: Diagnosis not present

## 2022-06-07 DIAGNOSIS — M62562 Muscle wasting and atrophy, not elsewhere classified, left lower leg: Secondary | ICD-10-CM | POA: Diagnosis not present

## 2022-06-07 DIAGNOSIS — R2681 Unsteadiness on feet: Secondary | ICD-10-CM | POA: Diagnosis not present

## 2022-06-07 DIAGNOSIS — M62561 Muscle wasting and atrophy, not elsewhere classified, right lower leg: Secondary | ICD-10-CM | POA: Diagnosis not present

## 2022-06-07 DIAGNOSIS — M62552 Muscle wasting and atrophy, not elsewhere classified, left thigh: Secondary | ICD-10-CM | POA: Diagnosis not present

## 2022-06-08 ENCOUNTER — Other Ambulatory Visit: Payer: Self-pay

## 2022-06-08 DIAGNOSIS — M62562 Muscle wasting and atrophy, not elsewhere classified, left lower leg: Secondary | ICD-10-CM | POA: Diagnosis not present

## 2022-06-08 DIAGNOSIS — M62551 Muscle wasting and atrophy, not elsewhere classified, right thigh: Secondary | ICD-10-CM | POA: Diagnosis not present

## 2022-06-08 DIAGNOSIS — M62561 Muscle wasting and atrophy, not elsewhere classified, right lower leg: Secondary | ICD-10-CM | POA: Diagnosis not present

## 2022-06-08 DIAGNOSIS — R2681 Unsteadiness on feet: Secondary | ICD-10-CM | POA: Diagnosis not present

## 2022-06-08 DIAGNOSIS — M62552 Muscle wasting and atrophy, not elsewhere classified, left thigh: Secondary | ICD-10-CM | POA: Diagnosis not present

## 2022-06-12 DIAGNOSIS — M62561 Muscle wasting and atrophy, not elsewhere classified, right lower leg: Secondary | ICD-10-CM | POA: Diagnosis not present

## 2022-06-12 DIAGNOSIS — M62562 Muscle wasting and atrophy, not elsewhere classified, left lower leg: Secondary | ICD-10-CM | POA: Diagnosis not present

## 2022-06-12 DIAGNOSIS — M62552 Muscle wasting and atrophy, not elsewhere classified, left thigh: Secondary | ICD-10-CM | POA: Diagnosis not present

## 2022-06-12 DIAGNOSIS — R2681 Unsteadiness on feet: Secondary | ICD-10-CM | POA: Diagnosis not present

## 2022-06-12 DIAGNOSIS — M62551 Muscle wasting and atrophy, not elsewhere classified, right thigh: Secondary | ICD-10-CM | POA: Diagnosis not present

## 2022-06-13 DIAGNOSIS — H353112 Nonexudative age-related macular degeneration, right eye, intermediate dry stage: Secondary | ICD-10-CM | POA: Diagnosis not present

## 2022-06-13 DIAGNOSIS — Z8571 Personal history of Hodgkin lymphoma: Secondary | ICD-10-CM | POA: Diagnosis not present

## 2022-06-13 DIAGNOSIS — H353121 Nonexudative age-related macular degeneration, left eye, early dry stage: Secondary | ICD-10-CM | POA: Diagnosis not present

## 2022-06-13 DIAGNOSIS — H35363 Drusen (degenerative) of macula, bilateral: Secondary | ICD-10-CM | POA: Diagnosis not present

## 2022-06-13 DIAGNOSIS — H35453 Secondary pigmentary degeneration, bilateral: Secondary | ICD-10-CM | POA: Diagnosis not present

## 2022-06-13 DIAGNOSIS — H25813 Combined forms of age-related cataract, bilateral: Secondary | ICD-10-CM | POA: Diagnosis not present

## 2022-06-14 ENCOUNTER — Other Ambulatory Visit: Payer: Self-pay

## 2022-06-14 ENCOUNTER — Inpatient Hospital Stay: Payer: Medicare Other

## 2022-06-14 ENCOUNTER — Encounter: Payer: Self-pay | Admitting: *Deleted

## 2022-06-14 ENCOUNTER — Inpatient Hospital Stay: Payer: Medicare Other | Admitting: Family

## 2022-06-14 ENCOUNTER — Encounter: Payer: Self-pay | Admitting: Family

## 2022-06-14 VITALS — BP 110/39

## 2022-06-14 VITALS — BP 127/57 | HR 66 | Temp 98.5°F | Resp 18 | Ht 66.0 in | Wt 150.1 lb

## 2022-06-14 DIAGNOSIS — C8339 Diffuse large B-cell lymphoma, extranodal and solid organ sites: Secondary | ICD-10-CM | POA: Diagnosis not present

## 2022-06-14 DIAGNOSIS — C8332 Diffuse large B-cell lymphoma, intrathoracic lymph nodes: Secondary | ICD-10-CM | POA: Diagnosis not present

## 2022-06-14 DIAGNOSIS — C8510 Unspecified B-cell lymphoma, unspecified site: Secondary | ICD-10-CM

## 2022-06-14 DIAGNOSIS — Z5111 Encounter for antineoplastic chemotherapy: Secondary | ICD-10-CM | POA: Diagnosis not present

## 2022-06-14 DIAGNOSIS — Z79899 Other long term (current) drug therapy: Secondary | ICD-10-CM | POA: Diagnosis not present

## 2022-06-14 DIAGNOSIS — D509 Iron deficiency anemia, unspecified: Secondary | ICD-10-CM | POA: Diagnosis not present

## 2022-06-14 LAB — CMP (CANCER CENTER ONLY)
ALT: 18 U/L (ref 0–44)
AST: 17 U/L (ref 15–41)
Albumin: 4.1 g/dL (ref 3.5–5.0)
Alkaline Phosphatase: 53 U/L (ref 38–126)
Anion gap: 8 (ref 5–15)
BUN: 11 mg/dL (ref 8–23)
CO2: 30 mmol/L (ref 22–32)
Calcium: 9.6 mg/dL (ref 8.9–10.3)
Chloride: 100 mmol/L (ref 98–111)
Creatinine: 0.68 mg/dL (ref 0.44–1.00)
GFR, Estimated: >60 mL/min (ref >60)
Glucose, Bld: 115 mg/dL — ABNORMAL HIGH (ref 70–99)
Potassium: 4 mmol/L (ref 3.5–5.1)
Sodium: 138 mmol/L (ref 135–145)
Total Bilirubin: 0.5 mg/dL (ref 0.3–1.2)
Total Protein: 6.3 g/dL — ABNORMAL LOW (ref 6.5–8.1)

## 2022-06-14 LAB — CBC WITH DIFFERENTIAL (CANCER CENTER ONLY)
Abs Immature Granulocytes: 0.06 10*3/uL (ref 0.00–0.07)
Basophils Absolute: 0 10*3/uL (ref 0.0–0.1)
Basophils Relative: 1 %
Eosinophils Absolute: 0 10*3/uL (ref 0.0–0.5)
Eosinophils Relative: 0 %
HCT: 36 % (ref 36.0–46.0)
Hemoglobin: 12.1 g/dL (ref 12.0–15.0)
Immature Granulocytes: 1 %
Lymphocytes Relative: 31 %
Lymphs Abs: 2 10*3/uL (ref 0.7–4.0)
MCH: 32.9 pg (ref 26.0–34.0)
MCHC: 33.6 g/dL (ref 30.0–36.0)
MCV: 97.8 fL (ref 80.0–100.0)
Monocytes Absolute: 0.8 10*3/uL (ref 0.1–1.0)
Monocytes Relative: 12 %
Neutro Abs: 3.5 10*3/uL (ref 1.7–7.7)
Neutrophils Relative %: 55 %
Platelet Count: 143 10*3/uL — ABNORMAL LOW (ref 150–400)
RBC: 3.68 MIL/uL — ABNORMAL LOW (ref 3.87–5.11)
RDW: 15.6 % — ABNORMAL HIGH (ref 11.5–15.5)
WBC Count: 6.4 10*3/uL (ref 4.0–10.5)
nRBC: 0 % (ref 0.0–0.2)

## 2022-06-14 LAB — LACTATE DEHYDROGENASE: LDH: 164 U/L (ref 98–192)

## 2022-06-14 MED ORDER — HEPARIN SOD (PORK) LOCK FLUSH 100 UNIT/ML IV SOLN
500.0000 [IU] | Freq: Once | INTRAVENOUS | Status: AC | PRN
  Administered 2022-06-14: 500 [IU]

## 2022-06-14 MED ORDER — SODIUM CHLORIDE 0.9% FLUSH
10.0000 mL | INTRAVENOUS | Status: DC | PRN
  Administered 2022-06-14: 10 mL

## 2022-06-14 MED ORDER — SODIUM CHLORIDE 0.9 % IV SOLN: Freq: Once | INTRAVENOUS | Status: AC

## 2022-06-14 MED ORDER — SODIUM CHLORIDE 0.9 % IV SOLN
500.0000 mg/m2 | Freq: Once | INTRAVENOUS | Status: AC
  Administered 2022-06-14: 900 mg via INTRAVENOUS
  Filled 2022-06-14: qty 50

## 2022-06-14 MED ORDER — ACETAMINOPHEN 325 MG PO TABS: 650.0000 mg | ORAL_TABLET | Freq: Once | ORAL | Status: DC

## 2022-06-14 MED ORDER — DIPHENHYDRAMINE HCL 25 MG PO CAPS: 50.0000 mg | ORAL_CAPSULE | Freq: Once | ORAL | Status: DC

## 2022-06-14 NOTE — Progress Notes (Signed)
Patient received next treatment cycle. She is doing very well, and tolerating treatment without concern.   Oncology Nurse Navigator Documentation     06/14/2022   11:45 AM  Oncology Nurse Navigator Flowsheets  Navigator Follow Up Date: 06/29/2022  Navigator Follow Up Reason: Follow-up Appointment;Chemotherapy  Navigator Location [facility]  Navigator Encounter Type Treatment;Appt/Treatment Plan Review  Patient Visit Type MedOnc  Treatment Phase Active Tx  Barriers/Navigation Needs No Barriers At This Time  Interventions None Required  Acuity Level 1-No Barriers  Support Groups/Services Friends and Family  Time Spent with Patient 15

## 2022-06-14 NOTE — Patient Instructions (Signed)

## 2022-06-14 NOTE — Patient Instructions (Signed)
[facility]  Discharge Instructions: Thank you for choosing [facility] to provide your oncology and hematology care.   If you have a lab appointment with the Cancer Center, please go directly to the Cancer Center and check in at the registration area.  Wear comfortable clothing and clothing appropriate for easy access to any Portacath or PICC line.   We strive to give you quality time with your provider. You may need to reschedule your appointment if you arrive late (15 or more minutes).  Arriving late affects you and other patients whose appointments are after yours.  Also, if you miss three or more appointments without notifying the office, you may be dismissed from the clinic at the provider's discretion.      For prescription refill requests, have your pharmacy contact our office and allow 72 hours for refills to be completed.    Today you received the following chemotherapy and/or immunotherapy agents Ruxience      To help prevent nausea and vomiting after your treatment, we encourage you to take your nausea medication as directed.  BELOW ARE SYMPTOMS THAT SHOULD BE REPORTED IMMEDIATELY: *FEVER GREATER THAN 100.4 F (38 C) OR HIGHER *CHILLS OR SWEATING *NAUSEA AND VOMITING THAT IS NOT CONTROLLED WITH YOUR NAUSEA MEDICATION *UNUSUAL SHORTNESS OF BREATH *UNUSUAL BRUISING OR BLEEDING *URINARY PROBLEMS (pain or burning when urinating, or frequent urination) *BOWEL PROBLEMS (unusual diarrhea, constipation, pain near the anus) TENDERNESS IN MOUTH AND THROAT WITH OR WITHOUT PRESENCE OF ULCERS (sore throat, sores in mouth, or a toothache) UNUSUAL RASH, SWELLING OR PAIN  UNUSUAL VAGINAL DISCHARGE OR ITCHING   Items with * indicate a potential emergency and should be followed up as soon as possible or go to the Emergency Department if any problems should occur.  Please show the CHEMOTHERAPY ALERT CARD or IMMUNOTHERAPY ALERT CARD at check-in  to the Emergency Department and triage nurse. Should you have questions after your visit or need to cancel or reschedule your appointment, please contact [facility]  [phone] and follow the prompts.  Office hours are 8:00 a.m. to 4:30 p.m. Monday - Friday. Please note that voicemails left after 4:00 p.m. may not be returned until the following business day.  We are closed weekends and major holidays. You have access to a nurse at all times for urgent questions. Please call the main number to the clinic [phone] and follow the prompts.  For any non-urgent questions, you may also contact your provider using MyChart. We now offer e-Visits for anyone 39 and older to request care online for non-urgent symptoms. For details visit [email or web address].   Also download the MyChart app! Go to the app store, search "MyChart", open the app, select [facility], and log in with your MyChart username and password.

## 2022-06-14 NOTE — Progress Notes (Signed)
Hematology and Oncology Follow Up Visit  Ariel [name] [id number] [birth year] 67 y.o. 06/14/2022   Principle Diagnosis:  Diffuse large cell non-Hodgkin's lymphoma-CNS involvement -- relapsed   Past Therapy: Status post chemotherapy with R-ICE/R-MTV --  s/p cycle #4 MATRIX -- s/p cycle #4 -- start on 05/02/2021 --completed on 08/06/2021 Neulasta 6 mg subcu post chemotherapy MTV- s/p cycle #1 -- given on 01/22/204   Current Therapy:        Imbruvica/Rituxan -- started 05/02/2022, s/p cycle 2   Interim History:  Ariel [name] is here today for follow-up and treatment. She is doing well and has no complaints at this time.  Her stamina and ROM continue to improve with PT.  She has enjoyed exercising and denies fatigue at this time.  No fever, chills, n/v, cough, rash, dizziness, SOB, chest pain, palpitations, abdominal pain or changes in bowel or bladder habits.  No swelling, tenderness, numbness or tingling in her extremities at this time.  No falls or syncope. She is ambulating with a cane for added support.  Appetite and hydration are good. Weight is stable at 150 lbs.   ECOG Performance Status: 1 - Symptomatic but completely ambulatory  Medications:  Allergies as of 06/14/2022       Reactions   Decadron [dexamethasone] Other (See Comments)   Leg weakness Hallucinations   Plavix [clopidogrel] Hives, Swelling, Other (See Comments)   Lip swelling   Prednisone Other (See Comments)   Steroids caused psychological issues  Hallucinations   Codeine Nausea And Vomiting        Medication List        Accurate as of June 14, 2022  9:55 AM. If you have any questions, ask your nurse or doctor.          acetaminophen 325 MG tablet Commonly known as: TYLENOL Take 1,300 mg by mouth daily as needed for headache, fever, moderate pain or mild pain.   dronabinol 2.5 MG capsule Commonly known as: MARINOL Take 1 capsule (2.5 mg total) by mouth 2 (two) times daily before a meal.    feeding supplement Liqd Take 237 mLs by mouth 3 (three) times daily between meals.   Imbruvica 420 MG tablet Generic drug: ibrutinib Take 1 tablet (420 mg total) by mouth daily. Take with a glass of water.   lidocaine-prilocaine cream Commonly known as: EMLA Apply to affected area once   naphazoline-glycerin 0.012-0.25 % Soln Commonly known as: CLEAR EYES REDNESS Place 2 drops into both eyes 4 (four) times daily as needed for eye irritation.   polyethylene glycol 17 g packet Commonly known as: MIRALAX / GLYCOLAX Take 17 g by mouth daily as needed for mild constipation.        Allergies:  Allergies  Allergen Reactions   Decadron [Dexamethasone] Other (See Comments)    Leg weakness Hallucinations   Plavix [Clopidogrel] Hives, Swelling and Other (See Comments)    Lip swelling    Prednisone Other (See Comments)    Steroids caused psychological issues  Hallucinations   Codeine Nausea And Vomiting    Past Medical History, Surgical history, Social history, and Family History were reviewed and updated.  Review of Systems: All other 10 point review of systems is negative.   Physical Exam:  height is  (1.676 m) and weight is 150 lb 1.9 oz (68.1 kg). Her oral temperature is 98.5 F (36.9 C). Her blood pressure is 127/57 (abnormal) and her pulse is 66. Her respiration is 18 and oxygen saturation is 100%.  Wt Readings from Last 3 Encounters:  06/14/22 150 lb 1.9 oz (68.1 kg)  05/31/22 147 lb (66.7 kg)  05/10/22 143 lb (64.9 kg)    Ocular: Sclerae unicteric, pupils equal, round and reactive to light Ear-nose-throat: Oropharynx clear, dentition fair Lymphatic: No cervical or supraclavicular adenopathy Lungs no rales or rhonchi, good excursion bilaterally Heart regular rate and rhythm, no murmur appreciated Abd soft, nontender, positive bowel sounds MSK no focal spinal tenderness, no joint edema Neuro: non-focal, well-oriented, appropriate affect Breasts: Deferred    Lab Results  Component Value Date   WBC 6.4 06/14/2022   HGB 12.1 06/14/2022   HCT 36.0 06/14/2022   MCV 97.8 06/14/2022   PLT 143 (L) 06/14/2022   Lab Results  Component Value Date   FERRITIN 2,051 (H) 04/14/2022   IRON 40 04/05/2022   TIBC 118 (L) 04/05/2022   UIBC 78 04/05/2022   IRONPCTSAT 34 (H) 04/05/2022   Lab Results  Component Value Date   RETICCTPCT 2.4 11/15/2021   RBC 3.68 (L) 06/14/2022   No results found for: "KPAFRELGTCHN", "LAMBDASER", "KAPLAMBRATIO" No results found for: "IGGSERUM", "IGA", "IGMSERUM" No results found for: "TOTALPROTELP", "ALBUMINELP", "A1GS", "A2GS", "BETS", "BETA2SER", "GAMS", "MSPIKE", "SPEI"   Chemistry      Component Value Date/Time   NA 138 05/31/2022 0846   K 4.0 05/31/2022 0846   CL 100 05/31/2022 0846   CO2 29 05/31/2022 0846   BUN 11 05/31/2022 0846   CREATININE 0.72 05/31/2022 0846   CREATININE 0.68 11/18/2012 1439      Component Value Date/Time   CALCIUM 9.9 05/31/2022 0846   ALKPHOS 50 05/31/2022 0846   AST 13 (L) 05/31/2022 0846   ALT 16 05/31/2022 0846   BILITOT 0.7 05/31/2022 0846       Impression and Plan: Ariel [name] is a very pleasant 67 yo caucasian female with iron deficiency anemia and then was diagnosed with diffuse large cell non-Hodgkin's lymphoma that had traveled to her brain. She completed a total of 8 cycles of treatment with 4 cycles of a combination protocol followed by 4 cycles of MATRIX. She now has the recurrence and received high-dose methotrexate based therapy.  She responded quite nicely and MRI did not show any evidence of residual disease after 1 cycle. The goal is to get her to [facility] for CAR-T therapy if possible. We will proceed with cycle 4 today as planned.  Follow-up with MD in 2 weeks.   Ariel Stanford, NP 4/17/20249:55 AM

## 2022-06-16 ENCOUNTER — Other Ambulatory Visit: Payer: Self-pay

## 2022-06-16 ENCOUNTER — Other Ambulatory Visit: Payer: Self-pay | Admitting: Family

## 2022-06-16 ENCOUNTER — Encounter: Payer: Self-pay | Admitting: *Deleted

## 2022-06-16 DIAGNOSIS — M62562 Muscle wasting and atrophy, not elsewhere classified, left lower leg: Secondary | ICD-10-CM | POA: Diagnosis not present

## 2022-06-16 DIAGNOSIS — R2681 Unsteadiness on feet: Secondary | ICD-10-CM | POA: Diagnosis not present

## 2022-06-16 DIAGNOSIS — M62551 Muscle wasting and atrophy, not elsewhere classified, right thigh: Secondary | ICD-10-CM | POA: Diagnosis not present

## 2022-06-16 DIAGNOSIS — C8332 Diffuse large B-cell lymphoma, intrathoracic lymph nodes: Secondary | ICD-10-CM

## 2022-06-16 DIAGNOSIS — C8589 Other specified types of non-Hodgkin lymphoma, extranodal and solid organ sites: Secondary | ICD-10-CM

## 2022-06-16 DIAGNOSIS — C8339 Primary central nervous system lymphoma: Secondary | ICD-10-CM

## 2022-06-16 DIAGNOSIS — M62552 Muscle wasting and atrophy, not elsewhere classified, left thigh: Secondary | ICD-10-CM | POA: Diagnosis not present

## 2022-06-16 DIAGNOSIS — M62561 Muscle wasting and atrophy, not elsewhere classified, right lower leg: Secondary | ICD-10-CM | POA: Diagnosis not present

## 2022-06-16 NOTE — Progress Notes (Signed)
Patient still hasn't heard from [facility] regarding consultation for CAR-T. Called and spoke to the referral coordinator who had no record of her referral. I was then transferred to [facility], on the oncology referral division. She also stated that she saw no referral. While on the phone she took the pertinent information to create the referral. Provided her with both our office and fax number. She stated the referral was created and they would reach out to the patient.   Oncology Nurse Navigator Documentation     06/16/2022   10:00 AM  Oncology Nurse Navigator Flowsheets  Navigator Follow Up Date: 06/29/2022  Navigator Follow Up Reason: Follow-up Appointment;Chemotherapy  Navigator Location [facility]  Navigator Encounter Type Treatment;Appt/Treatment Plan Review  Patient Visit Type MedOnc  Treatment Phase Active Tx  Barriers/Navigation Needs No Barriers At This Time  Interventions Coordination of Care  Acuity Level 1-No Barriers  Coordination of Care Other  Support Groups/Services Friends and Family  Time Spent with Patient 30

## 2022-06-19 DIAGNOSIS — M62561 Muscle wasting and atrophy, not elsewhere classified, right lower leg: Secondary | ICD-10-CM | POA: Diagnosis not present

## 2022-06-19 DIAGNOSIS — M62552 Muscle wasting and atrophy, not elsewhere classified, left thigh: Secondary | ICD-10-CM | POA: Diagnosis not present

## 2022-06-19 DIAGNOSIS — M62562 Muscle wasting and atrophy, not elsewhere classified, left lower leg: Secondary | ICD-10-CM | POA: Diagnosis not present

## 2022-06-19 DIAGNOSIS — R2681 Unsteadiness on feet: Secondary | ICD-10-CM | POA: Diagnosis not present

## 2022-06-19 DIAGNOSIS — M62551 Muscle wasting and atrophy, not elsewhere classified, right thigh: Secondary | ICD-10-CM | POA: Diagnosis not present

## 2022-06-20 DIAGNOSIS — D7581 Myelofibrosis: Secondary | ICD-10-CM | POA: Diagnosis not present

## 2022-06-20 DIAGNOSIS — R898 Other abnormal findings in specimens from other organs, systems and tissues: Secondary | ICD-10-CM | POA: Diagnosis not present

## 2022-06-22 DIAGNOSIS — H2513 Age-related nuclear cataract, bilateral: Secondary | ICD-10-CM | POA: Diagnosis not present

## 2022-06-22 DIAGNOSIS — H353131 Nonexudative age-related macular degeneration, bilateral, early dry stage: Secondary | ICD-10-CM | POA: Diagnosis not present

## 2022-06-22 DIAGNOSIS — H52203 Unspecified astigmatism, bilateral: Secondary | ICD-10-CM | POA: Diagnosis not present

## 2022-06-26 ENCOUNTER — Ambulatory Visit (HOSPITAL_COMMUNITY)
Admission: RE | Admit: 2022-06-26 | Discharge: 2022-06-26 | Disposition: A | Discharge status: Home / Self Care | Payer: Medicare Other | Source: Ambulatory Visit | Attending: Family | Admitting: Family

## 2022-06-26 DIAGNOSIS — C8332 Diffuse large B-cell lymphoma, intrathoracic lymph nodes: Secondary | ICD-10-CM | POA: Diagnosis not present

## 2022-06-26 DIAGNOSIS — C8589 Other specified types of non-Hodgkin lymphoma, extranodal and solid organ sites: Secondary | ICD-10-CM | POA: Insufficient documentation

## 2022-06-26 DIAGNOSIS — G9389 Other specified disorders of brain: Secondary | ICD-10-CM | POA: Diagnosis not present

## 2022-06-26 MED ORDER — GADOBUTROL 1 MMOL/ML IV SOLN
8.0000 mL | Freq: Once | INTRAVENOUS | Status: AC | PRN
  Administered 2022-06-26: 8 mL via INTRAVENOUS

## 2022-06-29 ENCOUNTER — Encounter: Payer: Self-pay | Admitting: *Deleted

## 2022-06-29 ENCOUNTER — Ambulatory Visit: Payer: Medicare Other

## 2022-06-29 ENCOUNTER — Inpatient Hospital Stay: Payer: Medicare Other | Attending: Internal Medicine

## 2022-06-29 ENCOUNTER — Encounter: Payer: Self-pay | Admitting: Hematology & Oncology

## 2022-06-29 ENCOUNTER — Other Ambulatory Visit: Payer: Self-pay

## 2022-06-29 ENCOUNTER — Inpatient Hospital Stay: Payer: Medicare Other

## 2022-06-29 ENCOUNTER — Inpatient Hospital Stay: Payer: Medicare Other | Admitting: Hematology & Oncology

## 2022-06-29 ENCOUNTER — Other Ambulatory Visit: Payer: Medicare Other

## 2022-06-29 ENCOUNTER — Ambulatory Visit: Payer: Medicare Other | Admitting: Hematology & Oncology

## 2022-06-29 VITALS — BP 123/42 | HR 60 | Resp 17

## 2022-06-29 VITALS — BP 146/50 | HR 74 | Temp 98.2°F | Resp 18 | Ht 67.0 in | Wt 150.0 lb

## 2022-06-29 DIAGNOSIS — C8339 Diffuse large B-cell lymphoma, extranodal and solid organ sites: Secondary | ICD-10-CM | POA: Insufficient documentation

## 2022-06-29 DIAGNOSIS — C8510 Unspecified B-cell lymphoma, unspecified site: Secondary | ICD-10-CM

## 2022-06-29 DIAGNOSIS — Z79899 Other long term (current) drug therapy: Secondary | ICD-10-CM | POA: Diagnosis not present

## 2022-06-29 DIAGNOSIS — Z5111 Encounter for antineoplastic chemotherapy: Secondary | ICD-10-CM | POA: Insufficient documentation

## 2022-06-29 DIAGNOSIS — C8332 Diffuse large B-cell lymphoma, intrathoracic lymph nodes: Secondary | ICD-10-CM

## 2022-06-29 DIAGNOSIS — D509 Iron deficiency anemia, unspecified: Secondary | ICD-10-CM | POA: Diagnosis not present

## 2022-06-29 LAB — CMP (CANCER CENTER ONLY)
ALT: 17 U/L (ref 0–44)
AST: 17 U/L (ref 15–41)
Albumin: 4.2 g/dL (ref 3.5–5.0)
Alkaline Phosphatase: 54 U/L (ref 38–126)
Anion gap: 10 (ref 5–15)
BUN: 14 mg/dL (ref 8–23)
CO2: 29 mmol/L (ref 22–32)
Calcium: 9.7 mg/dL (ref 8.9–10.3)
Chloride: 100 mmol/L (ref 98–111)
Creatinine: 0.72 mg/dL (ref 0.44–1.00)
GFR, Estimated: >60 mL/min (ref >60)
Glucose, Bld: 95 mg/dL (ref 70–99)
Potassium: 4.2 mmol/L (ref 3.5–5.1)
Sodium: 139 mmol/L (ref 135–145)
Total Bilirubin: 0.4 mg/dL (ref 0.3–1.2)
Total Protein: 6.6 g/dL (ref 6.5–8.1)

## 2022-06-29 LAB — LACTATE DEHYDROGENASE: LDH: 172 U/L (ref 98–192)

## 2022-06-29 LAB — CBC WITH DIFFERENTIAL (CANCER CENTER ONLY)
Abs Immature Granulocytes: 0.06 10*3/uL (ref 0.00–0.07)
Basophils Absolute: 0 10*3/uL (ref 0.0–0.1)
Basophils Relative: 1 %
Eosinophils Absolute: 0 10*3/uL (ref 0.0–0.5)
Eosinophils Relative: 1 %
HCT: 38 % (ref 36.0–46.0)
Hemoglobin: 12.7 g/dL (ref 12.0–15.0)
Immature Granulocytes: 1 %
Lymphocytes Relative: 36 %
Lymphs Abs: 2.4 10*3/uL (ref 0.7–4.0)
MCH: 32.6 pg (ref 26.0–34.0)
MCHC: 33.4 g/dL (ref 30.0–36.0)
MCV: 97.4 fL (ref 80.0–100.0)
Monocytes Absolute: 0.8 10*3/uL (ref 0.1–1.0)
Monocytes Relative: 13 %
Neutro Abs: 3.2 10*3/uL (ref 1.7–7.7)
Neutrophils Relative %: 48 %
Platelet Count: 140 10*3/uL — ABNORMAL LOW (ref 150–400)
RBC: 3.9 MIL/uL (ref 3.87–5.11)
RDW: 14.1 % (ref 11.5–15.5)
WBC Count: 6.5 10*3/uL (ref 4.0–10.5)
nRBC: 0 % (ref 0.0–0.2)

## 2022-06-29 MED ORDER — SODIUM CHLORIDE 0.9% FLUSH
10.0000 mL | INTRAVENOUS | Status: DC | PRN
  Administered 2022-06-29: 10 mL

## 2022-06-29 MED ORDER — ACETAMINOPHEN 325 MG PO TABS: 650.0000 mg | ORAL_TABLET | Freq: Once | ORAL | Status: DC

## 2022-06-29 MED ORDER — SODIUM CHLORIDE 0.9 % IV SOLN
500.0000 mg/m2 | Freq: Once | INTRAVENOUS | Status: AC
  Administered 2022-06-29: 900 mg via INTRAVENOUS
  Filled 2022-06-29: qty 50

## 2022-06-29 MED ORDER — DIPHENHYDRAMINE HCL 25 MG PO CAPS: 50.0000 mg | ORAL_CAPSULE | Freq: Once | ORAL | Status: DC

## 2022-06-29 MED ORDER — HEPARIN SOD (PORK) LOCK FLUSH 100 UNIT/ML IV SOLN
500.0000 [IU] | Freq: Once | INTRAVENOUS | Status: AC | PRN
  Administered 2022-06-29: 500 [IU]

## 2022-06-29 MED ORDER — SODIUM CHLORIDE 0.9 % IV SOLN: Freq: Once | INTRAVENOUS | Status: AC

## 2022-06-29 MED ORDER — SODIUM CHLORIDE 0.9 % IV SOLN
500.0000 mg/m2 | Freq: Once | INTRAVENOUS | Status: DC
  Filled 2022-06-29: qty 90

## 2022-06-29 NOTE — Patient Instructions (Signed)

## 2022-06-29 NOTE — Progress Notes (Signed)
Hematology and Oncology Follow Up Visit  Ariel [name] [id number] 1955/07/17 67 y.o. 06/29/2022   Principle Diagnosis:  Diffuse large cell non-Hodgkin's lymphoma-CNS involvement -- relapsed   Past Therapy: Status post chemotherapy with R-ICE/R-MTV --  s/p cycle #4 MATRIX -- s/p cycle #4 -- start on 05/02/2021 --completed on 08/06/2021 Neulasta 6 mg subcu post chemotherapy MTV- s/p cycle #1 -- given on 01/22/204   Current Therapy:        Imbruvica/Rituxan -- started 05/02/2022, s/p cycle #3   Interim History:  Ariel [name] is here today for follow-up and treatment.  The great news that she is now is a new grandmother.  She has another grandchild.  He is a boy.  He was born 3 weeks ago.  Everybody is doing well down in [place].  Hopefully, she will be able to go down to see him in person.  The great news also is that she had an MRI of the brain that was done on 06/26/2022.  The MRI of the brain showed continued resolution of the lymphoma.  No residual enhancement is noted.  She may sell her house.  She is in independent living.  She enjoys this.  She has had no problems with nausea or vomiting.  She has had no cough or shortness of breath.  I think she goes out to [facility] in a week or so to be considered for CAR-T therapy for her recurrent CNS lymphoma.  She has had no fever.  There is been no bleeding.  There is no bowel or bladder incontinence.  Overall, I would say performance status is probably ECOG 1.     Medications:  Allergies as of 06/29/2022       Reactions   Decadron [dexamethasone] Other (See Comments)   Leg weakness Hallucinations   Plavix [clopidogrel] Hives, Swelling, Other (See Comments)   Lip swelling   Prednisone Other (See Comments)   Steroids caused psychological issues  Hallucinations   Codeine Nausea And Vomiting        Medication List        Accurate as of Jun 29, 2022  9:41 AM. If you have any questions, ask your nurse or doctor.           acetaminophen 325 MG tablet Commonly known as: TYLENOL Take 1,300 mg by mouth daily as needed for headache, fever, moderate pain or mild pain.   dronabinol 2.5 MG capsule Commonly known as: MARINOL Take 1 capsule (2.5 mg total) by mouth 2 (two) times daily before a meal.   feeding supplement Liqd Take 237 mLs by mouth 3 (three) times daily between meals.   Imbruvica 420 MG tablet Generic drug: ibrutinib Take 1 tablet (420 mg total) by mouth daily. Take with a glass of water.   lidocaine-prilocaine cream Commonly known as: EMLA Apply to affected area once   naphazoline-glycerin 0.012-0.25 % Soln Commonly known as: CLEAR EYES REDNESS Place 2 drops into both eyes 4 (four) times daily as needed for eye irritation.   polyethylene glycol 17 g packet Commonly known as: MIRALAX / GLYCOLAX Take 17 g by mouth daily as needed for mild constipation.        Allergies:  Allergies  Allergen Reactions   Decadron [Dexamethasone] Other (See Comments)    Leg weakness Hallucinations   Plavix [Clopidogrel] Hives, Swelling and Other (See Comments)    Lip swelling    Prednisone Other (See Comments)    Steroids caused psychological issues  Hallucinations   Codeine Nausea And  Vomiting    Past Medical History, Surgical history, Social history, and Family History were reviewed and updated.  Review of Systems: Review of Systems  Constitutional: Negative.   HENT: Negative.    Eyes: Negative.   Respiratory: Negative.    Cardiovascular: Negative.   Gastrointestinal: Negative.   Genitourinary: Negative.   Musculoskeletal: Negative.   Skin: Negative.   Neurological: Negative.   Endo/Heme/Allergies: Negative.   Psychiatric/Behavioral: Negative.       Physical Exam:  height is 5\' 7"  (1.702 m) and weight is 150 lb (68 kg). Her oral temperature is 98.2 F (36.8 C). Her blood pressure is 146/50 (abnormal) and her pulse is 74. Her respiration is 18 and oxygen saturation is 100%.    Wt Readings from Last 3 Encounters:  06/29/22 150 lb (68 kg)  06/14/22 150 lb 1.9 oz (68.1 kg)  05/31/22 147 lb (66.7 kg)    Physical Activity: Insufficiently Active (07/13/2021)   Exercise Vital Sign    Days of Exercise per Week: 3 days    Minutes of Exercise per Session: 20 min   Physical Exam Vitals reviewed.  HENT:     Head: Normocephalic and atraumatic.  Eyes:     Pupils: Pupils are equal, round, and reactive to light.  Cardiovascular:     Rate and Rhythm: Normal rate and regular rhythm.     Heart sounds: Normal heart sounds.  Pulmonary:     Effort: Pulmonary effort is normal.     Breath sounds: Normal breath sounds.  Abdominal:     General: Bowel sounds are normal.     Palpations: Abdomen is soft.  Musculoskeletal:        General: No tenderness or deformity. Normal range of motion.     Cervical back: Normal range of motion.  Lymphadenopathy:     Cervical: No cervical adenopathy.  Skin:    General: Skin is warm and dry.     Findings: No erythema or rash.  Neurological:     Mental Status: She is alert and oriented to person, place, and time.  Psychiatric:        Behavior: Behavior normal.        Thought Content: Thought content normal.        Judgment: Judgment normal.      Lab Results  Component Value Date   WBC 6.5 06/29/2022   HGB 12.7 06/29/2022   HCT 38.0 06/29/2022   MCV 97.4 06/29/2022   PLT 140 (L) 06/29/2022   Lab Results  Component Value Date   FERRITIN 2,051 (H) 04/14/2022   IRON 40 04/05/2022   TIBC 118 (L) 04/05/2022   UIBC 78 04/05/2022   IRONPCTSAT 34 (H) 04/05/2022   Lab Results  Component Value Date   RETICCTPCT 2.4 11/15/2021   RBC 3.90 06/29/2022   No results found for: "KPAFRELGTCHN", "LAMBDASER", "KAPLAMBRATIO" No results found for: "IGGSERUM", "IGA", "IGMSERUM" No results found for: "TOTALPROTELP", "ALBUMINELP", "A1GS", "A2GS", "BETS", "BETA2SER", "GAMS", "MSPIKE", "SPEI"   Chemistry      Component Value Date/Time    NA 139 06/29/2022 0900   K 4.2 06/29/2022 0900   CL 100 06/29/2022 0900   CO2 29 06/29/2022 0900   BUN 14 06/29/2022 0900   CREATININE 0.72 06/29/2022 0900   CREATININE 0.68 11/18/2012 1439      Component Value Date/Time   CALCIUM 9.7 06/29/2022 0900   ALKPHOS 54 06/29/2022 0900   AST 17 06/29/2022 0900   ALT 17 06/29/2022 0900   BILITOT 0.4 06/29/2022  0900       Impression and Plan: Ms. Strothman is a very pleasant 67 yo caucasian female with iron deficiency anemia and then was diagnosed with diffuse large cell non-Hodgkin's lymphoma that had traveled to her brain.  I am just so happy that her MRI turn out so well.  She is tolerating the Imbruvica nicely.  There is no palpitations.  Her lab work looks incredible today.  He will be interesting to see what do has to say about CAR-T therapy.  Given the great results of the MRI, I do not think we need another MRI for at least 3-4 months.  We will plan to get her back in another 2 weeks. Marland Kitchen   [name], MD 5/2/20249:41 AM

## 2022-06-29 NOTE — Progress Notes (Signed)
Patient scheduled for consultation of CART at [facility] on 07/07/2022.  Oncology Nurse Navigator Documentation     06/29/2022    9:30 AM  Oncology Nurse Navigator Flowsheets  Navigator Follow Up Date: 07/07/2022  Navigator Follow Up Reason: Other:  Navigator Location [facility]  Navigator Encounter Type Treatment;Appt/Treatment Plan Review  Patient Visit Type MedOnc  Treatment Phase Active Tx  Barriers/Navigation Needs No Barriers At This Time  Interventions Psycho-Social Support  Acuity Level 1-No Barriers  Support Groups/Services Friends and Family  Time Spent with Patient 15

## 2022-06-29 NOTE — Patient Instructions (Signed)
[facility]  Discharge Instructions: Thank you for choosing [facility] to provide your oncology and hematology care.   If you have a lab appointment with the Cancer Center, please go directly to the Cancer Center and check in at the registration area.  Wear comfortable clothing and clothing appropriate for easy access to any Portacath or PICC line.   We strive to give you quality time with your provider. You may need to reschedule your appointment if you arrive late (15 or more minutes).  Arriving late affects you and other patients whose appointments are after yours.  Also, if you miss three or more appointments without notifying the office, you may be dismissed from the clinic at the provider's discretion.      For prescription refill requests, have your pharmacy contact our office and allow 72 hours for refills to be completed.    Today you received the following chemotherapy and/or immunotherapy agents Rituxan      To help prevent nausea and vomiting after your treatment, we encourage you to take your nausea medication as directed.  BELOW ARE SYMPTOMS THAT SHOULD BE REPORTED IMMEDIATELY: *FEVER GREATER THAN 100.4 F (38 C) OR HIGHER *CHILLS OR SWEATING *NAUSEA AND VOMITING THAT IS NOT CONTROLLED WITH YOUR NAUSEA MEDICATION *UNUSUAL SHORTNESS OF BREATH *UNUSUAL BRUISING OR BLEEDING *URINARY PROBLEMS (pain or burning when urinating, or frequent urination) *BOWEL PROBLEMS (unusual diarrhea, constipation, pain near the anus) TENDERNESS IN MOUTH AND THROAT WITH OR WITHOUT PRESENCE OF ULCERS (sore throat, sores in mouth, or a toothache) UNUSUAL RASH, SWELLING OR PAIN  UNUSUAL VAGINAL DISCHARGE OR ITCHING   Items with * indicate a potential emergency and should be followed up as soon as possible or go to the Emergency Department if any problems should occur.  Please show the CHEMOTHERAPY ALERT CARD or IMMUNOTHERAPY ALERT CARD at check-in  to the Emergency Department and triage nurse. Should you have questions after your visit or need to cancel or reschedule your appointment, please contact [facility]  [phone] and follow the prompts.  Office hours are 8:00 a.m. to 4:30 p.m. Monday - Friday. Please note that voicemails left after 4:00 p.m. may not be returned until the following business day.  We are closed weekends and major holidays. You have access to a nurse at all times for urgent questions. Please call the main number to the clinic [phone] and follow the prompts.  For any non-urgent questions, you may also contact your provider using MyChart. We now offer e-Visits for anyone 18 and older to request care online for non-urgent symptoms. For details visit [email or web address].   Also download the MyChart app! Go to the app store, search "MyChart", open the app, select [facility], and log in with your MyChart username and password.   

## 2022-06-30 ENCOUNTER — Other Ambulatory Visit: Payer: Self-pay

## 2022-06-30 ENCOUNTER — Other Ambulatory Visit (HOSPITAL_COMMUNITY): Payer: Self-pay

## 2022-07-07 DIAGNOSIS — C8589 Other specified types of non-Hodgkin lymphoma, extranodal and solid organ sites: Secondary | ICD-10-CM | POA: Diagnosis not present

## 2022-07-08 ENCOUNTER — Encounter: Payer: Self-pay | Admitting: Hematology & Oncology

## 2022-07-10 ENCOUNTER — Encounter: Payer: Self-pay | Admitting: Hematology & Oncology

## 2022-07-10 DIAGNOSIS — H25811 Combined forms of age-related cataract, right eye: Secondary | ICD-10-CM | POA: Diagnosis not present

## 2022-07-10 DIAGNOSIS — H2511 Age-related nuclear cataract, right eye: Secondary | ICD-10-CM | POA: Diagnosis not present

## 2022-07-10 MED ORDER — FAMCICLOVIR 250 MG PO TABS: 250.0000 mg | ORAL_TABLET | Freq: Every day | ORAL | 2 refills | Status: DC

## 2022-07-10 MED ORDER — SULFAMETHOXAZOLE-TRIMETHOPRIM 800-160 MG PO TABS: 1.0000 | ORAL_TABLET | Freq: Every day | ORAL | 2 refills | Status: DC

## 2022-07-10 NOTE — Telephone Encounter (Signed)
Called and talked to patient, per Dr.Ennever we will cancel infusion on 07/12/2022 and she will see him on 5/29 as scheduled. He agrees to increasing Ibrutinib to 840 mg daily, wants her to take Famvir 250mg  daily, and Bactrim DS daily. Will send in rx. Patient understands Dr.Ennever will also go over this at next follow up. Medications sent to [facility] per pt request.

## 2022-07-11 ENCOUNTER — Telehealth: Payer: Self-pay

## 2022-07-11 ENCOUNTER — Other Ambulatory Visit: Payer: Self-pay

## 2022-07-11 ENCOUNTER — Encounter: Payer: Self-pay | Admitting: *Deleted

## 2022-07-11 DIAGNOSIS — C8332 Diffuse large B-cell lymphoma, intrathoracic lymph nodes: Secondary | ICD-10-CM

## 2022-07-11 MED ORDER — IBRUTINIB 420 MG PO TABS
840.0000 mg | ORAL_TABLET | Freq: Every day | ORAL | 3 refills | Status: DC
Start: 2022-07-11 — End: 2022-11-08

## 2022-07-11 NOTE — Progress Notes (Signed)
Patient was seen last week for consideration of CAR-T. At this time, she is not eligible for CAR-T. She would be eligible for consideration at first sign of progression. Per their recommendation, Rituxan will be stopped and patient will continue on ibrutinib at a higher dose.   Patient will be maintained on maintenance and serial imagining. Patient has not had navigational needs for some time. Will discontinue active navigation at this time but be available to the patient as needed in the future.   Oncology Nurse Navigator Documentation     07/11/2022    8:30 AM  Oncology Nurse Navigator Flowsheets  Navigation Complete Date: 07/11/2022  Post Navigation: Continue to Follow Patient? No  Reason Not Navigating Patient: Patient On Maintenance Chemotherapy  Navigator Location [facility]  Navigator Encounter Type Appt/Treatment Plan Review  Patient Visit Type MedOnc  Treatment Phase Active Tx  Barriers/Navigation Needs No Barriers At This Time  Interventions None Required  Acuity Level 1-No Barriers  Support Groups/Services Friends and Family  Time Spent with Patient 15

## 2022-07-11 NOTE — Telephone Encounter (Signed)
Sent updated med list with the 2 new medications and medication change to [facility] per pt request.

## 2022-07-12 ENCOUNTER — Inpatient Hospital Stay: Payer: Medicare Other

## 2022-07-12 ENCOUNTER — Ambulatory Visit: Payer: Medicare Other | Admitting: Hematology & Oncology

## 2022-07-21 ENCOUNTER — Other Ambulatory Visit (HOSPITAL_COMMUNITY): Payer: Self-pay

## 2022-07-25 ENCOUNTER — Other Ambulatory Visit (HOSPITAL_COMMUNITY): Payer: Self-pay

## 2022-07-25 ENCOUNTER — Other Ambulatory Visit: Payer: Self-pay

## 2022-07-26 ENCOUNTER — Inpatient Hospital Stay (HOSPITAL_BASED_OUTPATIENT_CLINIC_OR_DEPARTMENT_OTHER): Payer: Medicare Other | Admitting: Hematology & Oncology

## 2022-07-26 ENCOUNTER — Ambulatory Visit: Payer: Medicare Other

## 2022-07-26 ENCOUNTER — Other Ambulatory Visit: Payer: Self-pay

## 2022-07-26 ENCOUNTER — Inpatient Hospital Stay: Payer: Medicare Other

## 2022-07-26 ENCOUNTER — Encounter: Payer: Self-pay | Admitting: Hematology & Oncology

## 2022-07-26 VITALS — BP 134/58 | HR 78 | Temp 98.9°F | Resp 18 | Ht 67.0 in | Wt 157.0 lb

## 2022-07-26 DIAGNOSIS — C8339 Diffuse large B-cell lymphoma, extranodal and solid organ sites: Secondary | ICD-10-CM | POA: Diagnosis not present

## 2022-07-26 DIAGNOSIS — Z79899 Other long term (current) drug therapy: Secondary | ICD-10-CM | POA: Diagnosis not present

## 2022-07-26 DIAGNOSIS — Z5111 Encounter for antineoplastic chemotherapy: Secondary | ICD-10-CM | POA: Diagnosis not present

## 2022-07-26 DIAGNOSIS — C8510 Unspecified B-cell lymphoma, unspecified site: Secondary | ICD-10-CM | POA: Diagnosis not present

## 2022-07-26 DIAGNOSIS — C8332 Diffuse large B-cell lymphoma, intrathoracic lymph nodes: Secondary | ICD-10-CM

## 2022-07-26 DIAGNOSIS — D509 Iron deficiency anemia, unspecified: Secondary | ICD-10-CM | POA: Diagnosis not present

## 2022-07-26 DIAGNOSIS — Z95828 Presence of other vascular implants and grafts: Secondary | ICD-10-CM

## 2022-07-26 LAB — CMP (CANCER CENTER ONLY)
ALT: 22 U/L (ref 0–44)
AST: 21 U/L (ref 15–41)
Albumin: 4.6 g/dL (ref 3.5–5.0)
Alkaline Phosphatase: 53 U/L (ref 38–126)
Anion gap: 9 (ref 5–15)
BUN: 12 mg/dL (ref 8–23)
CO2: 28 mmol/L (ref 22–32)
Calcium: 10.2 mg/dL (ref 8.9–10.3)
Chloride: 103 mmol/L (ref 98–111)
Creatinine: 0.72 mg/dL (ref 0.44–1.00)
GFR, Estimated: >60 mL/min (ref >60)
Glucose, Bld: 102 mg/dL — ABNORMAL HIGH (ref 70–99)
Potassium: 4 mmol/L (ref 3.5–5.1)
Sodium: 140 mmol/L (ref 135–145)
Total Bilirubin: 0.4 mg/dL (ref 0.3–1.2)
Total Protein: 6.2 g/dL — ABNORMAL LOW (ref 6.5–8.1)

## 2022-07-26 LAB — CBC WITH DIFFERENTIAL (CANCER CENTER ONLY)
Abs Immature Granulocytes: 0.01 10*3/uL (ref 0.00–0.07)
Basophils Absolute: 0 10*3/uL (ref 0.0–0.1)
Basophils Relative: 1 %
Eosinophils Absolute: 0.1 10*3/uL (ref 0.0–0.5)
Eosinophils Relative: 2 %
HCT: 37.9 % (ref 36.0–46.0)
Hemoglobin: 12.6 g/dL (ref 12.0–15.0)
Immature Granulocytes: 0 %
Lymphocytes Relative: 38 %
Lymphs Abs: 1.6 10*3/uL (ref 0.7–4.0)
MCH: 32.5 pg (ref 26.0–34.0)
MCHC: 33.2 g/dL (ref 30.0–36.0)
MCV: 97.7 fL (ref 80.0–100.0)
Monocytes Absolute: 0.9 10*3/uL (ref 0.1–1.0)
Monocytes Relative: 20 %
Neutro Abs: 1.7 10*3/uL (ref 1.7–7.7)
Neutrophils Relative %: 39 %
Platelet Count: 142 10*3/uL — ABNORMAL LOW (ref 150–400)
RBC: 3.88 MIL/uL (ref 3.87–5.11)
RDW: 12.6 % (ref 11.5–15.5)
WBC Count: 4.2 10*3/uL (ref 4.0–10.5)
nRBC: 0 % (ref 0.0–0.2)

## 2022-07-26 LAB — IRON AND IRON BINDING CAPACITY (CC-WL,HP ONLY)
Iron: 67 ug/dL (ref 28–170)
Saturation Ratios: 23 % (ref 10.4–31.8)
TIBC: 291 ug/dL (ref 250–450)
UIBC: 224 ug/dL (ref 148–442)

## 2022-07-26 LAB — LACTATE DEHYDROGENASE: LDH: 192 U/L (ref 98–192)

## 2022-07-26 LAB — FERRITIN: Ferritin: 796 ng/mL — ABNORMAL HIGH (ref 11–307)

## 2022-07-26 MED ORDER — SODIUM CHLORIDE 0.9% FLUSH
10.0000 mL | Freq: Once | INTRAVENOUS | Status: AC
  Administered 2022-07-26: 10 mL via INTRAVENOUS

## 2022-07-26 MED ORDER — HEPARIN SOD (PORK) LOCK FLUSH 100 UNIT/ML IV SOLN
500.0000 [IU] | Freq: Once | INTRAVENOUS | Status: AC
  Administered 2022-07-26: 500 [IU] via INTRAVENOUS

## 2022-07-26 NOTE — Patient Instructions (Signed)

## 2022-07-26 NOTE — Progress Notes (Signed)
Hematology and Oncology Follow Up Visit  Ariel [name] [id number] [birth year] 67 y.o. 07/26/2022   Principle Diagnosis:  Diffuse large cell non-Hodgkin's lymphoma-CNS involvement -- relapsed   Past Therapy: Status post chemotherapy with R-ICE/R-MTV --  s/p cycle #4 MATRIX -- s/p cycle #4 -- start on 05/02/2021 --completed on 08/06/2021 Neulasta 6 mg subcu post chemotherapy MTV- s/p cycle #1 -- given on 01/22/204   Current Therapy:        Imbruvica/Rituxan -- started 05/02/2022, s/p cycle #5 -DC on 07/16/2022   Interim History:  Ariel [name] is here today for follow-up.  Ariel [name] was seen out at [facility].  They agreed that we are doing the appropriate intervention with Imbruvica and Rituxan.  They thought however that we can stop the Rituxan.  As such, Ariel [name] had Ariel [name] fifth Rituxan on 06/29/2022.  Ariel [name] is just on Imbruvica.  Ariel [name] is doing well on the [place].  Ariel [name] really feels well.  Ariel [name] is so happy that Ariel [name] is going to go down to [place] to help Ariel [name] daughter and new grandson.  This is certainly a big factor with Ariel [name] quality of life and Ariel [name] priority.  Ariel [name] has had a good appetite.  Ariel [name] has had no nausea or vomiting.  There is no change in bowel or bladder habits.  Ariel [name] has had no fever.  There is been no bleeding.  There is been no issues with Ariel [name] hips.  Ariel [name] had bilateral hip surgery late last year.  Ariel [name] has recovered so well.  Ariel [name] last MRI was done about a month ago.  I do not think we need another MRI probably for another couple months.  Currently, I would say that Ariel [name] performance status is probably ECOG 0.     Medications:  Allergies as of 07/26/2022       Reactions   Decadron [dexamethasone] Other (See Comments)   Leg weakness Hallucinations   Plavix [clopidogrel] Hives, Swelling, Other (See Comments)   Lip swelling   Prednisone Other (See Comments)   Steroids caused psychological issues  Hallucinations   Codeine Nausea And Vomiting        Medication List        Accurate as of Jul 26, 2022 10:16 AM. If you have any questions, ask your nurse or doctor.          STOP taking these medications    dronabinol 2.5 MG capsule Commonly known as: MARINOL Stopped by: [name], MD   famciclovir 250 MG tablet Commonly known as: FAMVIR Stopped by: [name], MD       TAKE these medications    acetaminophen 325 MG tablet Commonly known as: TYLENOL Take 1,300 mg by mouth daily as needed for headache, fever, moderate pain or mild pain.   feeding supplement Liqd Take 237 mLs by mouth 3 (three) times daily between meals.   ibrutinib 420 MG tablet Commonly known as: IMBRUVICA Take 2 tablets (840 mg total) by mouth daily. Take with a glass of water.   lidocaine-prilocaine cream Commonly known as: EMLA Apply to affected area once   naphazoline-glycerin 0.012-0.25 % Soln Commonly known as: CLEAR EYES REDNESS Place 2 drops into both eyes 4 (four) times daily as needed for eye irritation.   polyethylene glycol 17 g packet Commonly known as: MIRALAX / GLYCOLAX Take 17 g by mouth daily as needed for mild constipation.   sulfamethoxazole-trimethoprim 800-160 MG tablet Commonly known as: BACTRIM DS Take 1 tablet by mouth daily.  Allergies:  Allergies  Allergen Reactions   Decadron [Dexamethasone] Other (See Comments)    Leg weakness Hallucinations   Plavix [Clopidogrel] Hives, Swelling and Other (See Comments)    Lip swelling    Prednisone Other (See Comments)    Steroids caused psychological issues  Hallucinations   Codeine Nausea And Vomiting    Past Medical History, Surgical history, Social history, and Family History were reviewed and updated.  Review of Systems: Review of Systems  Constitutional: Negative.   HENT: Negative.    Eyes: Negative.   Respiratory: Negative.    Cardiovascular: Negative.   Gastrointestinal: Negative.   Genitourinary: Negative.   Musculoskeletal: Negative.   Skin: Negative.   Neurological: Negative.    Endo/Heme/Allergies: Negative.   Psychiatric/Behavioral: Negative.       Physical Exam:  height is 5\' 7"  (1.702 m) and weight is 157 lb (71.2 kg). Ariel [name] oral temperature is 98.9 F (37.2 C). Ariel [name] blood pressure is 134/58 (abnormal) and Ariel [name] pulse is 78. Ariel [name] respiration is 18 and oxygen saturation is 96%.   Wt Readings from Last 3 Encounters:  07/26/22 157 lb (71.2 kg)  06/29/22 150 lb (68 kg)  06/14/22 150 lb 1.9 oz (68.1 kg)    Physical Activity: Insufficiently Active (07/13/2021)   Exercise Vital Sign    Days of Exercise per Week: 3 days    Minutes of Exercise per Session: 20 min   Physical Exam Vitals reviewed.  HENT:     Head: Normocephalic and atraumatic.  Eyes:     Pupils: Pupils are equal, round, and reactive to light.  Cardiovascular:     Rate and Rhythm: Normal rate and regular rhythm.     Heart sounds: Normal heart sounds.  Pulmonary:     Effort: Pulmonary effort is normal.     Breath sounds: Normal breath sounds.  Abdominal:     General: Bowel sounds are normal.     Palpations: Abdomen is soft.  Musculoskeletal:        General: No tenderness or deformity. Normal range of motion.     Cervical back: Normal range of motion.  Lymphadenopathy:     Cervical: No cervical adenopathy.  Skin:    General: Skin is warm and dry.     Findings: No erythema or rash.  Neurological:     Mental Status: Ariel [name] is alert and oriented to person, place, and time.  Psychiatric:        Behavior: Behavior normal.        Thought Content: Thought content normal.        Judgment: Judgment normal.      Lab Results  Component Value Date   WBC 4.2 07/26/2022   HGB 12.6 07/26/2022   HCT 37.9 07/26/2022   MCV 97.7 07/26/2022   PLT 142 (L) 07/26/2022   Lab Results  Component Value Date   FERRITIN 2,051 (H) 04/14/2022   IRON 40 04/05/2022   TIBC 118 (L) 04/05/2022   UIBC 78 04/05/2022   IRONPCTSAT 34 (H) 04/05/2022   Lab Results  Component Value Date   RETICCTPCT 2.4  11/15/2021   RBC 3.88 07/26/2022   No results found for: "KPAFRELGTCHN", "LAMBDASER", "KAPLAMBRATIO" No results found for: "IGGSERUM", "IGA", "IGMSERUM" No results found for: "TOTALPROTELP", "ALBUMINELP", "A1GS", "A2GS", "BETS", "BETA2SER", "GAMS", "MSPIKE", "SPEI"   Chemistry      Component Value Date/Time   NA 140 07/26/2022 0826   K 4.0 07/26/2022 0826   CL 103 07/26/2022 0826   CO2 28 07/26/2022 0826  BUN 12 07/26/2022 0826   CREATININE 0.72 07/26/2022 0826   CREATININE 0.68 11/18/2012 1439      Component Value Date/Time   CALCIUM 10.2 07/26/2022 0826   ALKPHOS 53 07/26/2022 0826   AST 21 07/26/2022 0826   ALT 22 07/26/2022 0826   BILITOT 0.4 07/26/2022 0826       Impression and Plan: Ariel [name] is a very pleasant 67 yo caucasian female with iron deficiency anemia and then was diagnosed with diffuse large cell non-Hodgkin's lymphoma that had traveled to Ariel [name] brain.  I am very amazed  that Ariel [name] has done so well.  When Ariel [name] saw the specialist at [facility], they did not feel like Ariel [name] was a candidate for CAR-T therapy at the present time because Ariel [name] was doing so well on the Imbruvica/Rituxan.  They did recommend a PET scan.  We will have to get this set up.  Hopefully, we can plan to get Ariel [name] back in another couple months.  I will set an MRI up before I see Ariel [name] back.  If Ariel [name] wants to go back home and live, I think Ariel [name] could do this.  I forgot to mention that Ariel [name] did have cataract surgery for Ariel [name] right eye.  The left eye is going to be in 3 weeks.  I do not think that there would be any problems with Ariel [name] being at home by herself.  I do not think there will be a problem with Ariel [name] driving.     [name], MD 5/29/202410:16 AM

## 2022-07-27 ENCOUNTER — Other Ambulatory Visit: Payer: Self-pay

## 2022-08-05 ENCOUNTER — Other Ambulatory Visit (HOSPITAL_COMMUNITY): Payer: Self-pay

## 2022-08-08 ENCOUNTER — Other Ambulatory Visit: Payer: Self-pay

## 2022-08-08 ENCOUNTER — Other Ambulatory Visit (HOSPITAL_COMMUNITY): Payer: Self-pay

## 2022-08-14 ENCOUNTER — Other Ambulatory Visit (HOSPITAL_COMMUNITY): Payer: Self-pay

## 2022-08-14 DIAGNOSIS — M170 Bilateral primary osteoarthritis of knee: Secondary | ICD-10-CM | POA: Diagnosis not present

## 2022-08-14 DIAGNOSIS — M1712 Unilateral primary osteoarthritis, left knee: Secondary | ICD-10-CM | POA: Diagnosis not present

## 2022-08-14 DIAGNOSIS — M1711 Unilateral primary osteoarthritis, right knee: Secondary | ICD-10-CM | POA: Diagnosis not present

## 2022-08-16 ENCOUNTER — Other Ambulatory Visit (HOSPITAL_COMMUNITY): Payer: Self-pay

## 2022-08-17 ENCOUNTER — Telehealth: Payer: Self-pay

## 2022-08-17 NOTE — Telephone Encounter (Signed)
Received phone call from patient stating that since starting the Imbruvica 2 pills a day she has had joint pain and noticed "red spots" on her hands. Pt states she let [facility] know and they told her it can take 6 weeks to balance out for the joint pain red spots since increasing her pills to 2 daily.  Dr. Myna Hidalgo aware and stated he would like patient to get a CBC. Pt stated she does not feel like she needs one but just wanted to let this office know. She stated she did not have a ride to come get the lab and was leaving for [place] for one week. Dr. Myna Hidalgo aware and no new orders received.  Pt also stated she was having dental surgery on July 11 to have 2 molars extracted. Pt questioning if she should hold her Imbruvica prior to the procedure. Pt stated she already has her amoxicillan prescription filled and aware to take it prior to her dental procedure. Pt instructed to hold her Imbruvica one week prior to procedure and restart one week post procedure. Pt verbalized understanding and had no further questions. Teach back utilized as patient went over dates of stopping and restarting with this Char[employer] fundraiser.  Pt aware to have a great vacation in [place]!

## 2022-08-18 ENCOUNTER — Other Ambulatory Visit (HOSPITAL_COMMUNITY): Payer: Self-pay

## 2022-08-18 ENCOUNTER — Other Ambulatory Visit: Payer: Self-pay

## 2022-08-22 ENCOUNTER — Telehealth: Payer: Self-pay | Admitting: *Deleted

## 2022-08-22 ENCOUNTER — Other Ambulatory Visit (HOSPITAL_COMMUNITY): Payer: Self-pay

## 2022-08-22 NOTE — Telephone Encounter (Signed)
This Clinical research associate returned patient's phone call regarding red spots on her face. Patient's cell phone went straight to voicemail. I left a message instructing her to go to an Urgent Care while she is in [place] and check a CBC. Also, I advised her when she got back we could make an appointment here for a lab check. Instructed her to call the office back and let us know what she decided to do.

## 2022-08-23 ENCOUNTER — Other Ambulatory Visit: Payer: Self-pay

## 2022-08-23 ENCOUNTER — Telehealth: Payer: Self-pay

## 2022-08-23 NOTE — Telephone Encounter (Signed)
Patient called stating the red spots were gone on her arms but were now on her face, she states it looks like rosacea, states she has been staying hydrated while in [place] and staying out of the sun or applying sunscreen. Attempted to call patient back, LM to try and go to an urgent care for lab draw to check her PLT count and reeducated on applying sunscreen, staying hydrated and avoiding the sun if possible d/t the medication she is currently on. She called back and LM stating she did not feel she needed to go have her labs drawn and will come in next week when she gets back from [place] to have her labs checked.

## 2022-08-28 ENCOUNTER — Other Ambulatory Visit (HOSPITAL_COMMUNITY): Payer: Self-pay

## 2022-08-29 ENCOUNTER — Other Ambulatory Visit (HOSPITAL_COMMUNITY): Payer: Self-pay

## 2022-08-29 ENCOUNTER — Other Ambulatory Visit: Payer: Medicare Other

## 2022-08-29 ENCOUNTER — Other Ambulatory Visit: Payer: Self-pay

## 2022-08-29 ENCOUNTER — Encounter: Payer: Self-pay | Admitting: Family

## 2022-08-29 ENCOUNTER — Inpatient Hospital Stay: Payer: Medicare Other | Admitting: Family

## 2022-08-29 ENCOUNTER — Inpatient Hospital Stay: Payer: Medicare Other

## 2022-08-29 ENCOUNTER — Inpatient Hospital Stay: Payer: Medicare Other | Attending: Internal Medicine

## 2022-08-29 VITALS — BP 117/46 | HR 74 | Temp 97.9°F | Resp 17 | Wt 156.0 lb

## 2022-08-29 DIAGNOSIS — C8510 Unspecified B-cell lymphoma, unspecified site: Secondary | ICD-10-CM | POA: Diagnosis not present

## 2022-08-29 DIAGNOSIS — Z9221 Personal history of antineoplastic chemotherapy: Secondary | ICD-10-CM | POA: Insufficient documentation

## 2022-08-29 DIAGNOSIS — C8589 Other specified types of non-Hodgkin lymphoma, extranodal and solid organ sites: Secondary | ICD-10-CM

## 2022-08-29 DIAGNOSIS — C8339 Diffuse large B-cell lymphoma, extranodal and solid organ sites: Secondary | ICD-10-CM | POA: Insufficient documentation

## 2022-08-29 DIAGNOSIS — D509 Iron deficiency anemia, unspecified: Secondary | ICD-10-CM | POA: Diagnosis not present

## 2022-08-29 DIAGNOSIS — Z79899 Other long term (current) drug therapy: Secondary | ICD-10-CM | POA: Diagnosis not present

## 2022-08-29 LAB — CBC WITH DIFFERENTIAL (CANCER CENTER ONLY)
Abs Immature Granulocytes: 0.06 10*3/uL (ref 0.00–0.07)
Basophils Absolute: 0 10*3/uL (ref 0.0–0.1)
Basophils Relative: 0 %
Eosinophils Absolute: 0 10*3/uL (ref 0.0–0.5)
Eosinophils Relative: 1 %
HCT: 37.4 % (ref 36.0–46.0)
Hemoglobin: 12.7 g/dL (ref 12.0–15.0)
Immature Granulocytes: 1 %
Lymphocytes Relative: 31 %
Lymphs Abs: 1.4 10*3/uL (ref 0.7–4.0)
MCH: 32.6 pg (ref 26.0–34.0)
MCHC: 34 g/dL (ref 30.0–36.0)
MCV: 95.9 fL (ref 80.0–100.0)
Monocytes Absolute: 0.8 10*3/uL (ref 0.1–1.0)
Monocytes Relative: 18 %
Neutro Abs: 2.2 10*3/uL (ref 1.7–7.7)
Neutrophils Relative %: 49 %
Platelet Count: 136 10*3/uL — ABNORMAL LOW (ref 150–400)
RBC: 3.9 MIL/uL (ref 3.87–5.11)
RDW: 12.3 % (ref 11.5–15.5)
WBC Count: 4.6 10*3/uL (ref 4.0–10.5)
nRBC: 0 % (ref 0.0–0.2)

## 2022-08-29 LAB — CMP (CANCER CENTER ONLY)
ALT: 21 U/L (ref 0–44)
AST: 23 U/L (ref 15–41)
Albumin: 4.4 g/dL (ref 3.5–5.0)
Alkaline Phosphatase: 47 U/L (ref 38–126)
Anion gap: 6 (ref 5–15)
BUN: 18 mg/dL (ref 8–23)
CO2: 29 mmol/L (ref 22–32)
Calcium: 9.9 mg/dL (ref 8.9–10.3)
Chloride: 102 mmol/L (ref 98–111)
Creatinine: 0.91 mg/dL (ref 0.44–1.00)
GFR, Estimated: >60 mL/min (ref >60)
Glucose, Bld: 106 mg/dL — ABNORMAL HIGH (ref 70–99)
Potassium: 3.9 mmol/L (ref 3.5–5.1)
Sodium: 137 mmol/L (ref 135–145)
Total Bilirubin: 0.8 mg/dL (ref 0.3–1.2)
Total Protein: 6.4 g/dL — ABNORMAL LOW (ref 6.5–8.1)

## 2022-08-29 LAB — LACTATE DEHYDROGENASE: LDH: 234 U/L — ABNORMAL HIGH (ref 98–192)

## 2022-08-29 MED ORDER — HEPARIN SOD (PORK) LOCK FLUSH 100 UNIT/ML IV SOLN
500.0000 [IU] | Freq: Once | INTRAVENOUS | Status: AC
  Administered 2022-08-29: 500 [IU] via INTRAVENOUS

## 2022-08-29 MED ORDER — ACYCLOVIR 400 MG PO TABS
400.0000 mg | ORAL_TABLET | Freq: Two times a day (BID) | ORAL | 2 refills | Status: DC
Start: 2022-08-29 — End: 2022-08-30

## 2022-08-29 MED ORDER — ACYCLOVIR 400 MG PO TABS
400.0000 mg | ORAL_TABLET | Freq: Two times a day (BID) | ORAL | 2 refills | Status: DC
Start: 2022-08-29 — End: 2022-08-29

## 2022-08-29 MED ORDER — SODIUM CHLORIDE 0.9% FLUSH
10.0000 mL | Freq: Once | INTRAVENOUS | Status: AC
  Administered 2022-08-29: 10 mL

## 2022-08-29 NOTE — Patient Instructions (Signed)

## 2022-08-29 NOTE — Progress Notes (Signed)
Hematology and Oncology Follow Up Visit  Ariel [name] [id number] 07-18-1955 67 y.o. 08/29/2022   Principle Diagnosis:  Diffuse large cell non-Hodgkin's lymphoma-CNS involvement -- relapsed   Past Therapy: Status post chemotherapy with R-ICE/R-MTV --  s/p cycle #4 MATRIX -- s/p cycle #4 -- start on 05/02/2021 --completed on 08/06/2021 Neulasta 6 mg subcu post chemotherapy MTV- s/p cycle #1 -- given on 01/22/204   Current Therapy:        Imbruvica/Rituxan -- started 05/02/2022, s/p cycle #5 - D/C'd Rituxan on 07/16/2022   Interim History:  Ariel [name] is here today for follow-up. She is doing well overall. Since the increase in her Imbruvica dose she has developed a raised red rash on her face which does not itch or burn.  She is taking her Bactrim DS Po daily as prescribed. We will also get her onto Acyclovir 400 mg PO BID and see if this helps. She will let us know if this worsens.   No fever, chills, n/v, cough, dizziness, SOB, chest pain, palpitations, abdominal pain or changes in bowel or bladder habits.  No swelling, tenderness, numbness or tingling in her extremities.  No falls or syncope reported.  Appetite and hydration are good. Weight is stable at 156 lbs.  She continues to do her exercises to help increase strength and stamina.   ECOG Performance Status: 1 - Symptomatic but completely ambulatory  Medications:  Allergies as of 08/29/2022       Reactions   Decadron [dexamethasone] Other (See Comments)   Leg weakness Hallucinations   Plavix [clopidogrel] Hives, Swelling, Other (See Comments)   Lip swelling   Prednisone Other (See Comments)   Steroids caused psychological issues  Hallucinations   Codeine Nausea And Vomiting        Medication List        Accurate as of August 29, 2022 10:32 AM. If you have any questions, ask your nurse or doctor.          acetaminophen 325 MG tablet Commonly known as: TYLENOL Take 1,300 mg by mouth daily as needed for headache,  fever, moderate pain or mild pain.   feeding supplement Liqd Take 237 mLs by mouth 3 (three) times daily between meals.   ibrutinib 420 MG tablet Commonly known as: IMBRUVICA Take 2 tablets (840 mg total) by mouth daily. Take with a glass of water.   lidocaine-prilocaine cream Commonly known as: EMLA Apply to affected area once   naphazoline-glycerin 0.012-0.25 % Soln Commonly known as: CLEAR EYES REDNESS Place 2 drops into both eyes 4 (four) times daily as needed for eye irritation.   polyethylene glycol 17 g packet Commonly known as: MIRALAX / GLYCOLAX Take 17 g by mouth daily as needed for mild constipation.   sulfamethoxazole-trimethoprim 800-160 MG tablet Commonly known as: BACTRIM DS Take 1 tablet by mouth daily.        Allergies:  Allergies  Allergen Reactions   Decadron [Dexamethasone] Other (See Comments)    Leg weakness Hallucinations   Plavix [Clopidogrel] Hives, Swelling and Other (See Comments)    Lip swelling    Prednisone Other (See Comments)    Steroids caused psychological issues  Hallucinations   Codeine Nausea And Vomiting    Past Medical History, Surgical history, Social history, and Family History were reviewed and updated.  Review of Systems: All other 10 point review of systems is negative.   Physical Exam:  vitals were not taken for this visit.   Wt Readings from Last 3 Encounters:  07/26/22 157 lb (71.2 kg)  06/29/22 150 lb (68 kg)  06/14/22 150 lb 1.9 oz (68.1 kg)    Ocular: Sclerae unicteric, pupils equal, round and reactive to light Ear-nose-throat: Oropharynx clear, dentition fair Lymphatic: No cervical or supraclavicular adenopathy Lungs no rales or rhonchi, good excursion bilaterally Heart regular rate and rhythm, no murmur appreciated Abd soft, nontender, positive bowel sounds MSK no focal spinal tenderness, no joint edema Neuro: non-focal, well-oriented, appropriate affect Breasts: Deferred   Lab Results  Component  Value Date   WBC 4.2 07/26/2022   HGB 12.6 07/26/2022   HCT 37.9 07/26/2022   MCV 97.7 07/26/2022   PLT 142 (L) 07/26/2022   Lab Results  Component Value Date   FERRITIN 796 (H) 07/26/2022   IRON 67 07/26/2022   TIBC 291 07/26/2022   UIBC 224 07/26/2022   IRONPCTSAT 23 07/26/2022   Lab Results  Component Value Date   RETICCTPCT 2.4 11/15/2021   RBC 3.88 07/26/2022   No results found for: "KPAFRELGTCHN", "LAMBDASER", "KAPLAMBRATIO" No results found for: "IGGSERUM", "IGA", "IGMSERUM" No results found for: "TOTALPROTELP", "ALBUMINELP", "A1GS", "A2GS", "BETS", "BETA2SER", "GAMS", "MSPIKE", "SPEI"   Chemistry      Component Value Date/Time   NA 140 07/26/2022 0826   K 4.0 07/26/2022 0826   CL 103 07/26/2022 0826   CO2 28 07/26/2022 0826   BUN 12 07/26/2022 0826   CREATININE 0.72 07/26/2022 0826   CREATININE 0.68 11/18/2012 1439      Component Value Date/Time   CALCIUM 10.2 07/26/2022 0826   ALKPHOS 53 07/26/2022 0826   AST 21 07/26/2022 0826   ALT 22 07/26/2022 0826   BILITOT 0.4 07/26/2022 0826       Impression and Plan: Ariel [name] is a very pleasant 67 yo caucasian female with iron deficiency anemia and then was diagnosed with diffuse large cell non-Hodgkin's lymphoma that had traveled to her brain.  PET scan is scheduled for 09/01/2022. We will get her scheduled for MRI of the brain.  We will also get her onto Acyclovir 400 mg PO BID.  Follow-up with MD in 6 weeks.   Eileen Stanford, NP 7/2/202410:32 AM

## 2022-08-30 ENCOUNTER — Other Ambulatory Visit: Payer: Self-pay

## 2022-08-30 ENCOUNTER — Telehealth: Payer: Self-pay | Admitting: *Deleted

## 2022-08-30 DIAGNOSIS — M170 Bilateral primary osteoarthritis of knee: Secondary | ICD-10-CM | POA: Diagnosis not present

## 2022-08-30 DIAGNOSIS — M6281 Muscle weakness (generalized): Secondary | ICD-10-CM | POA: Diagnosis not present

## 2022-08-30 NOTE — Telephone Encounter (Signed)
Call received from patient to inform Dr. Myna Hidalgo that the pharmacist at the facility she is at has informed her to not take Acyclovir 400 mg PO daily due to she is currently taking Famvir 250 mg PO daily.  Dr. Myna Hidalgo notified and is OK with pt continuing Famvir as prescribed.

## 2022-09-01 ENCOUNTER — Encounter (HOSPITAL_COMMUNITY)
Admission: RE | Admit: 2022-09-01 | Discharge: 2022-09-01 | Disposition: A | Discharge status: Home / Self Care | Payer: Medicare Other | Source: Ambulatory Visit | Attending: Hematology & Oncology | Admitting: Hematology & Oncology

## 2022-09-01 ENCOUNTER — Other Ambulatory Visit: Payer: Self-pay

## 2022-09-01 DIAGNOSIS — C8510 Unspecified B-cell lymphoma, unspecified site: Secondary | ICD-10-CM | POA: Diagnosis not present

## 2022-09-01 DIAGNOSIS — C8330 Diffuse large B-cell lymphoma, unspecified site: Secondary | ICD-10-CM | POA: Diagnosis not present

## 2022-09-01 DIAGNOSIS — R918 Other nonspecific abnormal finding of lung field: Secondary | ICD-10-CM | POA: Diagnosis not present

## 2022-09-01 LAB — GLUCOSE, CAPILLARY: Glucose-Capillary: 101 mg/dL — ABNORMAL HIGH (ref 70–99)

## 2022-09-01 MED ORDER — HEPARIN SOD (PORK) LOCK FLUSH 100 UNIT/ML IV SOLN
500.0000 [IU] | Freq: Once | INTRAVENOUS | Status: AC
  Administered 2022-09-01: 500 [IU] via INTRAVENOUS
  Filled 2022-09-01: qty 5

## 2022-09-01 MED ORDER — FLUDEOXYGLUCOSE F - 18 (FDG) INJECTION
7.8000 | Freq: Once | INTRAVENOUS | Status: AC
  Administered 2022-09-01: 7.79 via INTRAVENOUS

## 2022-09-04 ENCOUNTER — Inpatient Hospital Stay: Payer: Medicare Other

## 2022-09-04 ENCOUNTER — Inpatient Hospital Stay (HOSPITAL_BASED_OUTPATIENT_CLINIC_OR_DEPARTMENT_OTHER): Payer: Medicare Other | Admitting: Hematology & Oncology

## 2022-09-04 ENCOUNTER — Other Ambulatory Visit: Payer: Self-pay

## 2022-09-04 ENCOUNTER — Telehealth: Payer: Self-pay | Admitting: *Deleted

## 2022-09-04 ENCOUNTER — Other Ambulatory Visit: Payer: Self-pay | Admitting: *Deleted

## 2022-09-04 ENCOUNTER — Other Ambulatory Visit (HOSPITAL_COMMUNITY): Payer: Self-pay

## 2022-09-04 VITALS — BP 122/49 | HR 69 | Temp 98.2°F | Resp 18

## 2022-09-04 DIAGNOSIS — C8510 Unspecified B-cell lymphoma, unspecified site: Secondary | ICD-10-CM

## 2022-09-04 DIAGNOSIS — Z9221 Personal history of antineoplastic chemotherapy: Secondary | ICD-10-CM | POA: Diagnosis not present

## 2022-09-04 DIAGNOSIS — Z79899 Other long term (current) drug therapy: Secondary | ICD-10-CM | POA: Diagnosis not present

## 2022-09-04 DIAGNOSIS — Z95828 Presence of other vascular implants and grafts: Secondary | ICD-10-CM

## 2022-09-04 DIAGNOSIS — C8332 Diffuse large B-cell lymphoma, intrathoracic lymph nodes: Secondary | ICD-10-CM

## 2022-09-04 DIAGNOSIS — C8589 Other specified types of non-Hodgkin lymphoma, extranodal and solid organ sites: Secondary | ICD-10-CM

## 2022-09-04 DIAGNOSIS — D500 Iron deficiency anemia secondary to blood loss (chronic): Secondary | ICD-10-CM

## 2022-09-04 DIAGNOSIS — C8333 Diffuse large B-cell lymphoma, intra-abdominal lymph nodes: Secondary | ICD-10-CM

## 2022-09-04 DIAGNOSIS — C8339 Diffuse large B-cell lymphoma, extranodal and solid organ sites: Secondary | ICD-10-CM | POA: Diagnosis not present

## 2022-09-04 DIAGNOSIS — D509 Iron deficiency anemia, unspecified: Secondary | ICD-10-CM | POA: Diagnosis not present

## 2022-09-04 DIAGNOSIS — C8331 Diffuse large B-cell lymphoma, lymph nodes of head, face, and neck: Secondary | ICD-10-CM

## 2022-09-04 LAB — CBC WITH DIFFERENTIAL (CANCER CENTER ONLY)
Abs Immature Granulocytes: 0.04 10*3/uL (ref 0.00–0.07)
Basophils Absolute: 0 10*3/uL (ref 0.0–0.1)
Basophils Relative: 0 %
Eosinophils Absolute: 0 10*3/uL (ref 0.0–0.5)
Eosinophils Relative: 1 %
HCT: 38.8 % (ref 36.0–46.0)
Hemoglobin: 12.9 g/dL (ref 12.0–15.0)
Immature Granulocytes: 1 %
Lymphocytes Relative: 37 %
Lymphs Abs: 1.1 10*3/uL (ref 0.7–4.0)
MCH: 32.3 pg (ref 26.0–34.0)
MCHC: 33.2 g/dL (ref 30.0–36.0)
MCV: 97.2 fL (ref 80.0–100.0)
Monocytes Absolute: 0.6 10*3/uL (ref 0.1–1.0)
Monocytes Relative: 20 %
Neutro Abs: 1.2 10*3/uL — ABNORMAL LOW (ref 1.7–7.7)
Neutrophils Relative %: 41 %
Platelet Count: 148 10*3/uL — ABNORMAL LOW (ref 150–400)
RBC: 3.99 MIL/uL (ref 3.87–5.11)
RDW: 12.4 % (ref 11.5–15.5)
WBC Count: 3 10*3/uL — ABNORMAL LOW (ref 4.0–10.5)
nRBC: 0 % (ref 0.0–0.2)

## 2022-09-04 LAB — CMP (CANCER CENTER ONLY)
ALT: 21 U/L (ref 0–44)
AST: 21 U/L (ref 15–41)
Albumin: 4.5 g/dL (ref 3.5–5.0)
Alkaline Phosphatase: 51 U/L (ref 38–126)
Anion gap: 7 (ref 5–15)
BUN: 13 mg/dL (ref 8–23)
CO2: 30 mmol/L (ref 22–32)
Calcium: 10.1 mg/dL (ref 8.9–10.3)
Chloride: 102 mmol/L (ref 98–111)
Creatinine: 0.96 mg/dL (ref 0.44–1.00)
GFR, Estimated: >60 mL/min (ref >60)
Glucose, Bld: 134 mg/dL — ABNORMAL HIGH (ref 70–99)
Potassium: 3.8 mmol/L (ref 3.5–5.1)
Sodium: 139 mmol/L (ref 135–145)
Total Bilirubin: 0.5 mg/dL (ref 0.3–1.2)
Total Protein: 6.7 g/dL (ref 6.5–8.1)

## 2022-09-04 LAB — LACTATE DEHYDROGENASE: LDH: 239 U/L — ABNORMAL HIGH (ref 98–192)

## 2022-09-04 MED ORDER — HEPARIN SOD (PORK) LOCK FLUSH 100 UNIT/ML IV SOLN
500.0000 [IU] | Freq: Once | INTRAVENOUS | Status: AC
  Administered 2022-09-04: 500 [IU] via INTRAVENOUS

## 2022-09-04 MED ORDER — SODIUM CHLORIDE 0.9% FLUSH
10.0000 mL | Freq: Once | INTRAVENOUS | Status: AC
  Administered 2022-09-04: 10 mL via INTRAVENOUS

## 2022-09-04 NOTE — Telephone Encounter (Signed)
Call received from patient stating that she has blistering to her face, joint pain, red lesions to her face, neck and chest that are itching and bleeding at times and sores in her mouth.  Dr. Myna Hidalgo notified and would like to see pt today.  Call placed back to patient and patient notified to come in today for lab work and to see Dr. Myna Hidalgo.  Pt states that she will be here as soon as she can.  Scheduling notified.

## 2022-09-04 NOTE — Progress Notes (Signed)
Hematology and Oncology Follow Up Visit  Ariel [name] [id number] November 10, 1955 67 y.o. 09/04/2022   Principle Diagnosis:  Diffuse large cell non-Hodgkin's lymphoma-CNS involvement -- relapsed   Past Therapy: Status post chemotherapy with R-ICE/R-MTV --  s/p cycle #4 MATRIX -- s/p cycle #4 -- start on 05/02/2021 --completed on 08/06/2021 Neulasta 6 mg subcu post chemotherapy MTV- s/p cycle #1 -- given on 01/22/204   Current Therapy:        Imbruvica/Rituxan -- started 05/02/2022, s/p cycle #5 -DC on 07/16/2022   Interim History:  Ariel [name] is here today for an early visit.  She apparently developed this rash on her body.  This happened after she started taking Bactrim.  It seems as if the rash is more in the sun exposed areas.  It is certainly possible that she might be allergic to sulfa drugs.  I know that she has not had any kind of allergy before.  Of note, we did do a PET scan on her.  The PET scan of her body did not show any evidence of recurrent lymphoma.  She is due to have an MRI of the brain tomorrow.  She is going to have some teeth extracted this week.  Currently, she is off the [place].  She really cannot take steroids.  As such, I told her to use some topical creams to see if this may help the rash.  The areas are macular in nature.  There is no blisters.  She has no fever.  There is no change in bowel or bladder habits.  Of note, she was down in [place] visiting her family and new grandchild.  She said that the rash had started before she was down there.  She has had no weight loss or weight gain.  She is eating well.  She has had no cough or shortness of breath.  Overall, I would say that her performance status is probably ECOG 1.   Medications:  Allergies as of 09/04/2022       Reactions   Decadron [dexamethasone] Other (See Comments)   Leg weakness Hallucinations   Plavix [clopidogrel] Hives, Swelling, Other (See Comments)   Lip swelling   Prednisone Other  (See Comments)   Steroids caused psychological issues  Hallucinations   Codeine Nausea And Vomiting        Medication List        Accurate as of September 04, 2022 10:48 AM. If you have any questions, ask your nurse or doctor.          acetaminophen 325 MG tablet Commonly known as: TYLENOL Take 1,300 mg by mouth daily as needed for headache, fever, moderate pain or mild pain.   famciclovir 250 MG tablet Commonly known as: FAMVIR Take 250 mg by mouth daily.   feeding supplement Liqd Take 237 mLs by mouth 3 (three) times daily between meals.   ibrutinib 420 MG tablet Commonly known as: IMBRUVICA Take 2 tablets (840 mg total) by mouth daily. Take with a glass of water.   lidocaine-prilocaine cream Commonly known as: EMLA Apply to affected area once   naphazoline-glycerin 0.012-0.25 % Soln Commonly known as: CLEAR EYES REDNESS Place 2 drops into both eyes 4 (four) times daily as needed for eye irritation.   polyethylene glycol 17 g packet Commonly known as: MIRALAX / GLYCOLAX Take 17 g by mouth daily as needed for mild constipation.   sulfamethoxazole-trimethoprim 800-160 MG tablet Commonly known as: BACTRIM DS Take 1 tablet by mouth daily.  Allergies:  Allergies  Allergen Reactions   Decadron [Dexamethasone] Other (See Comments)    Leg weakness Hallucinations   Plavix [Clopidogrel] Hives, Swelling and Other (See Comments)    Lip swelling    Prednisone Other (See Comments)    Steroids caused psychological issues  Hallucinations   Codeine Nausea And Vomiting    Past Medical History, Surgical history, Social history, and Family History were reviewed and updated.  Review of Systems: Review of Systems  Constitutional: Negative.   HENT: Negative.    Eyes: Negative.   Respiratory: Negative.    Cardiovascular: Negative.   Gastrointestinal: Negative.   Genitourinary: Negative.   Musculoskeletal: Negative.   Skin: Negative.   Neurological: Negative.    Endo/Heme/Allergies: Negative.   Psychiatric/Behavioral: Negative.       Physical Exam:  oral temperature is 98.2 F (36.8 C). Her blood pressure is 122/49 (abnormal) and her pulse is 69. Her respiration is 18 and oxygen saturation is 98%.   Wt Readings from Last 3 Encounters:  08/29/22 156 lb (70.8 kg)  07/26/22 157 lb (71.2 kg)  06/29/22 150 lb (68 kg)    Physical Activity: Insufficiently Active (07/13/2021)   Exercise Vital Sign    Days of Exercise per Week: 3 days    Minutes of Exercise per Session: 20 min   Physical Exam Vitals reviewed.  HENT:     Head: Normocephalic and atraumatic.  Eyes:     Pupils: Pupils are equal, round, and reactive to light.  Cardiovascular:     Rate and Rhythm: Normal rate and regular rhythm.     Heart sounds: Normal heart sounds.  Pulmonary:     Effort: Pulmonary effort is normal.     Breath sounds: Normal breath sounds.  Abdominal:     General: Bowel sounds are normal.     Palpations: Abdomen is soft.  Musculoskeletal:        General: No tenderness or deformity. Normal range of motion.     Cervical back: Normal range of motion.  Lymphadenopathy:     Cervical: No cervical adenopathy.  Skin:    General: Skin is warm and dry.     Findings: No erythema or rash.     Comments: Skin exam does show macular lesions.  They are not blanching.  There is no vesicles.  There is no bleeding.  These appear to be more so on the sun exposed areas on her skin.  Neurological:     Mental Status: She is alert and oriented to person, place, and time.  Psychiatric:        Behavior: Behavior normal.        Thought Content: Thought content normal.        Judgment: Judgment normal.      Lab Results  Component Value Date   WBC 3.0 (L) 09/04/2022   HGB 12.9 09/04/2022   HCT 38.8 09/04/2022   MCV 97.2 09/04/2022   PLT 148 (L) 09/04/2022   Lab Results  Component Value Date   FERRITIN 796 (H) 07/26/2022   IRON 67 07/26/2022   TIBC 291 07/26/2022    UIBC 224 07/26/2022   IRONPCTSAT 23 07/26/2022   Lab Results  Component Value Date   RETICCTPCT 2.4 11/15/2021   RBC 3.99 09/04/2022   No results found for: "KPAFRELGTCHN", "LAMBDASER", "KAPLAMBRATIO" No results found for: "IGGSERUM", "IGA", "IGMSERUM" No results found for: "TOTALPROTELP", "ALBUMINELP", "A1GS", "A2GS", "BETS", "BETA2SER", "GAMS", "MSPIKE", "SPEI"   Chemistry      Component Value Date/Time  NA 139 09/04/2022 0930   K 3.8 09/04/2022 0930   CL 102 09/04/2022 0930   CO2 30 09/04/2022 0930   BUN 13 09/04/2022 0930   CREATININE 0.96 09/04/2022 0930   CREATININE 0.68 11/18/2012 1439      Component Value Date/Time   CALCIUM 10.1 09/04/2022 0930   ALKPHOS 51 09/04/2022 0930   AST 21 09/04/2022 0930   ALT 21 09/04/2022 0930   BILITOT 0.5 09/04/2022 0930       Impression and Plan: Ms. Archacki is a very pleasant 67 yo caucasian female with iron deficiency anemia and then was diagnosed with diffuse large cell non-Hodgkin's lymphoma that had traveled to her brain.  I am very amazed  that she has done so well.  When she saw the specialist at [facility], they did not feel like she was a candidate for CAR-T therapy at the present time because she was doing so well on the Imbruvica/Rituxan.  They did recommend a PET scan.  We will have to get this set up.  Again, looks this might be a reaction to the Bactrim.  There is only medication that I can see that she is on right now.  She has been on all of her other medications without difficulty.  We will go ahead and just stop the Bactrim.  I do not see that this would be a problem.  She stopped the Imbruvica and anticipation of her tooth extraction.  She will restart the Imbruvica on 09/14/2022.  I told her that when she restarts the Imbruvica, that she should just take 1 pill daily until I see her back.  We will have to see what the MRI of the brain shows.  This will certainly be very helpful.  I am just happy that she is doing so so  well.  As always, we have good fellowship.  I will plan to see her back in about a month.     [name], MD 7/8/202410:48 AM

## 2022-09-04 NOTE — Patient Instructions (Signed)

## 2022-09-04 NOTE — Addendum Note (Signed)
Addended by: Melodie Bouillon R on: 09/04/2022 03:00 PM   Modules accepted: Orders

## 2022-09-05 ENCOUNTER — Ambulatory Visit (HOSPITAL_COMMUNITY)
Admission: RE | Admit: 2022-09-05 | Discharge: 2022-09-05 | Disposition: A | Discharge status: Home / Self Care | Payer: Medicare Other | Source: Ambulatory Visit | Attending: Hematology & Oncology | Admitting: Hematology & Oncology

## 2022-09-05 DIAGNOSIS — C8510 Unspecified B-cell lymphoma, unspecified site: Secondary | ICD-10-CM | POA: Insufficient documentation

## 2022-09-05 DIAGNOSIS — G9389 Other specified disorders of brain: Secondary | ICD-10-CM | POA: Diagnosis not present

## 2022-09-05 MED ORDER — GADOBUTROL 1 MMOL/ML IV SOLN
7.0000 mL | Freq: Once | INTRAVENOUS | Status: AC | PRN
  Administered 2022-09-05: 7 mL via INTRAVENOUS

## 2022-09-06 ENCOUNTER — Ambulatory Visit (INDEPENDENT_AMBULATORY_CARE_PROVIDER_SITE_OTHER): Payer: Medicare Other | Admitting: *Deleted

## 2022-09-06 DIAGNOSIS — Z0000 Encounter for general adult medical examination without abnormal findings: Secondary | ICD-10-CM | POA: Diagnosis not present

## 2022-09-06 NOTE — Progress Notes (Signed)
Subjective:   Ariel [name] is a 67 y.o. female who presents for Medicare Annual (Subsequent) preventive examination.  Visit Complete: Virtual  I connected with  Jari Favre on 09/06/22 by a audio enabled telemedicine application and verified that I am speaking with the correct person using two identifiers.  Patient Location: Home  Provider Location: Office/Clinic  I discussed the limitations of evaluation and management by telemedicine. The patient expressed understanding and agreed to proceed.  Patient Medicare AWV questionnaire was completed by the patient on 09/05/22; I have confirmed that all information answered by patient is correct and no changes since this date.  Review of Systems     Cardiac Risk Factors include: advanced age (>78men, >32 women);dyslipidemia     Objective:    Today's Vitals   There is no height or weight on file to calculate BMI.     09/06/2022    9:09 AM 09/04/2022   10:20 AM 08/29/2022   10:34 AM 07/26/2022    9:27 AM 06/29/2022    9:14 AM 06/14/2022    9:36 AM 05/31/2022    8:46 AM  Advanced Directives  Does Patient Have a Medical Advance Directive? Yes Yes Yes Yes Yes Yes Yes  Type of Estate agent of [place];Living will Healthcare Power of [place];Living will Healthcare Power of [place];Living will Healthcare Power of [place];Living will Living will;Healthcare Power of Attorney Living will;Healthcare Power of Attorney Living will;Healthcare Power of Attorney  Does patient want to make changes to medical advance directive? No - Patient declined No - Patient declined  No - Patient declined No - Patient declined    Copy of Healthcare Power of Attorney in Chart? No - copy requested  Yes - validated most recent copy scanned in chart (See row information) Yes - validated most recent copy scanned in chart (See row information)  Yes - validated most recent copy scanned in chart (See row information)   Would patient like information on  creating a medical advance directive?   No - Patient declined No - Patient declined  No - Patient declined     Current Medications (verified) Outpatient Encounter Medications as of 09/06/2022  Medication Sig   acetaminophen (TYLENOL) 325 MG tablet Take 1,300 mg by mouth daily as needed for headache, fever, moderate pain or mild pain.   famciclovir (FAMVIR) 250 MG tablet Take 250 mg by mouth daily.   ibrutinib (IMBRUVICA) 420 MG tablet Take 2 tablets (840 mg total) by mouth daily. Take with a glass of water.   lidocaine-prilocaine (EMLA) cream Apply to affected area once   naphazoline-glycerin (CLEAR EYES REDNESS) 0.012-0.25 % SOLN Place 2 drops into both eyes 4 (four) times daily as needed for eye irritation.   [DISCONTINUED] feeding supplement (ENSURE ENLIVE / ENSURE PLUS) LIQD Take 237 mLs by mouth 3 (three) times daily between meals.   [DISCONTINUED] polyethylene glycol (MIRALAX / GLYCOLAX) 17 g packet Take 17 g by mouth daily as needed for mild constipation.   Facility-Administered Encounter Medications as of 09/06/2022  Medication   sodium chloride flush (NS) 0.9 % injection 10 mL    Allergies (verified) Decadron [dexamethasone], Plavix [clopidogrel], Prednisone, Codeine, and Sulfa antibiotics   History: Past Medical History:  Diagnosis Date   Allergy    Arthritis    Cataract    Chicken pox    Complication of anesthesia    Per patient, very slow to wake up after anesthesia   Goals of care, counseling/discussion 12/22/2020   High cholesterol  High grade B-cell lymphoma (HCC) 12/22/2020   Hyperglycemia 11/10/2020   Melanoma (HCC) 2021   right hip   Melanoma (HCC) 10/28/2020   pt stated brain liver and bladder   PONV (postoperative nausea and vomiting)    Stroke [facility])    Urinary incontinence    Past Surgical History:  Procedure Laterality Date   AIR/FLUID EXCHANGE Right 12/07/2020   Procedure: AIR/FLUID EXCHANGE;  Surgeon: Carmela Rima, MD;  Location: [facility] OR;   Service: Ophthalmology;  Laterality: Right;   APPENDECTOMY  1962   BIOPSY  12/14/2020   Procedure: BIOPSY;  Surgeon: Meridee Score Netty Starring., MD;  Location: [facility] ENDOSCOPY;  Service: Gastroenterology;;   ESOPHAGOGASTRODUODENOSCOPY (EGD) WITH PROPOFOL N/A 12/14/2020   Procedure: ESOPHAGOGASTRODUODENOSCOPY (EGD) WITH PROPOFOL;  Surgeon: Lemar Lofty., MD;  Location: [facility] ENDOSCOPY;  Service: Gastroenterology;  Laterality: N/A;   EXCISION MELANOMA WITH SENTINEL LYMPH NODE BIOPSY Right 10/09/2019   Procedure: WIDE LOCAL EXCISION WITH ADVANCEMENT FLAP CLOSURE RIGHT HIP MELANOMA WITH SENTINEL LYMPH NODE BIOPSY;  Surgeon: Almond Lint, MD;  Location: [facility] OR;  Service: General;  Laterality: Right;   EYE SURGERY     FINE NEEDLE ASPIRATION  12/14/2020   Procedure: FINE NEEDLE ASPIRATION (FNA) LINEAR;  Surgeon: Lemar Lofty., MD;  Location: [facility] ENDOSCOPY;  Service: Gastroenterology;;   HERNIA REPAIR  2009   IR BONE MARROW BIOPSY & ASPIRATION  05/08/2022   IR IMAGING GUIDED PORT INSERTION  12/30/2020   JOINT REPLACEMENT  2023   MOUTH SURGERY  11/2015   gum surgery and bone implant   PARS PLANA VITRECTOMY Right 12/07/2020   Procedure: PARS PLANA VITRECTOMY WITH 25 GAUGE;  Surgeon: Carmela Rima, MD;  Location: [facility] OR;  Service: Ophthalmology;  Laterality: Right;   PHOTOCOAGULATION WITH LASER Right 12/07/2020   Procedure: PHOTOCOAGULATION WITH ENDOLASER PANRITINAL COAGULATION;  Surgeon: Carmela Rima, MD;  Location: [facility] OR;  Service: Ophthalmology;  Laterality: Right;   TOTAL HIP ARTHROPLASTY Left 10/11/2021   Procedure: LEFT TOTAL HIP ARTHROPLASTY ANTERIOR APPROACH;  Surgeon: Marcene Corning, MD;  Location: [facility] ORS;  Service: Orthopedics;  Laterality: Left;   TOTAL HIP ARTHROPLASTY Right 01/10/2022   Procedure: RIGHT TOTAL HIP ARTHROPLASTY ANTERIOR APPROACH;  Surgeon: Marcene Corning, MD;  Location: [facility] ORS;  Service: Orthopedics;  Laterality: Right;   UPPER ESOPHAGEAL ENDOSCOPIC  ULTRASOUND (EUS) N/A 12/14/2020   Procedure: UPPER ESOPHAGEAL ENDOSCOPIC ULTRASOUND (EUS);  Surgeon: Lemar Lofty., MD;  Location: [facility] ENDOSCOPY;  Service: Gastroenterology;  Laterality: N/A;   WISDOM TOOTH EXTRACTION     Family History  Problem Relation Age of Onset   Coronary artery disease Father        died at 47   COPD Father    Alcohol abuse Father    Diabetes Mellitus II Father    Coronary artery disease Sister    Cancer Sister        breast   Heart attack Brother    Hyperlipidemia Other        died 65- "natural causes"   Hyperlipidemia Other        4 siblings    Hypertension Other        3 siblings   Social History   Socioeconomic History   Marital status: Widowed    Spouse name: Not on file   Number of children: Not on file   Years of education: Not on file   Highest education level: Not on file  Occupational History   Occupation: retired  Tobacco Use   Smoking status:  Never   Smokeless tobacco: Never  Vaping Use   Vaping Use: Never used  Substance and Sexual Activity   Alcohol use: Never   Drug use: Never   Sexual activity: Not Currently  Other Topics Concern   Not on file  Social History Narrative   05/09/2021   Presently living at [facility] in [place].   One Son- lives locally, 1 grand-daughter[place]aughter- 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   Right handed   Home one level    Caffeine 1-cup   Social Determinants of Health   Financial Resource Strain: Low Risk  (09/05/2022)   Overall Financial Resource Strain (CARDIA)    Difficulty of Paying Living Expenses: Not hard at all  Food Insecurity: No Food Insecurity (09/05/2022)   Hunger Vital Sign    Worried About Running Out of Food in the Last Year: Never true    Ran Out of Food in the Last Year: Never true  Transportation Needs: No Transportation Needs (09/05/2022)   PRAPARE - Administrator, Civil Service (Medical): No    Lack of Transportation  (Non-Medical): No  Physical Activity: Sufficiently Active (09/05/2022)   Exercise Vital Sign    Days of Exercise per Week: 6 days    Minutes of Exercise per Session: 40 min  Stress: No Stress Concern Present (09/05/2022)   [employer] of Occupational Health - Occupational Stress Questionnaire    Feeling of Stress : Not at all  Social Connections: Moderately Isolated (09/05/2022)   Social Connection and Isolation Panel [NHANES]    Frequency of Communication with Friends and Family: More than three times a week    Frequency of Social Gatherings with Friends and Family: More than three times a week    Attends Religious Services: More than 4 times per year    Active Member of [employer] or Organizations: No    Attends Banker Meetings: Never    Marital Status: Widowed    Tobacco Counseling Counseling given: Not Answered   Clinical Intake:  Pre-visit preparation completed: Yes  Pain : No/denies pain  Nutritional Risks: None Diabetes: No  How often do you need to have someone help you when you read instructions, pamphlets, or other written materials from your doctor or pharmacy?: 2 - Rarely  Interpreter Needed?: No  Information entered by :: Donne Anon, CMA   Activities of Daily Living    09/05/2022    4:39 PM 05/08/2022    6:46 AM  In your present state of health, do you have any difficulty performing the following activities:  Hearing? 0 0  Vision? 0 0  Difficulty concentrating or making decisions? 0 0  Walking or climbing stairs? 0 1  Dressing or bathing? 0 0  Doing errands, shopping? 0   Preparing Food and eating ? N   Using the Toilet? N   In the past six months, have you accidently leaked urine? Y   Do you have problems with loss of bowel control? N   Managing your Medications? N   Managing your Finances? N   Housekeeping or managing your Housekeeping? N     Patient Care Team: Sandford Craze, NP as PCP - General (Internal Medicine) Blondell Reveal, MD (Obstetrics and Gynecology) Tat, Octaviano Batty, DO as Consulting Physician (Neurology) [name], MD as Medical Oncologist (Oncology)  Indicate any recent Medical Services you may have received from other than [facility] providers in the past year (date may be approximate).  Assessment:   This is a routine wellness examination for [facility].  Hearing/Vision screen No results found.  Dietary issues and exercise activities discussed:     Goals Addressed   None    Depression Screen    09/06/2022    9:05 AM 07/13/2021    8:30 AM 07/08/2020   12:36 PM 05/31/2020    9:20 AM 06/06/2017    9:48 AM  PHQ 2/9 Scores  PHQ - 2 Score 0 0 1 0 1  PHQ- 9 Score     4    Fall Risk    09/05/2022    4:39 PM 08/26/2021    8:01 AM 07/13/2021    8:32 AM 10/13/2020    8:34 AM 07/08/2020   12:35 PM  Fall Risk   Falls in the past year? 0 0 0 0 0  Number falls in past yr: 0 0 0 0 0  Injury with Fall? 0 0 0 0 0  Risk for fall due to : No Fall Risks  Impaired vision    Follow up Falls evaluation completed  Falls prevention discussed  Falls prevention discussed    MEDICARE RISK AT HOME:  Medicare Risk at Home - 09/05/22 1639     If so, are there any without handrails? No    Life alert? No    Use of a cane, walker or w/c? No             TIMED UP AND GO:  Was the test performed?  No    Cognitive Function:        09/06/2022    9:09 AM 07/13/2021    8:34 AM  6CIT Screen  What Year? 0 points 0 points  What month? 0 points 0 points  What time? 0 points 0 points  Count back from 20 0 points 0 points  Months in reverse 0 points 0 points  Repeat phrase 0 points 0 points  Total Score 0 points 0 points    Immunizations Immunization History  Administered Date(s) Administered   Fluad Quad(high Dose 65+) 12/17/2019   Influenza,inj,Quad PF,6+ Mos 11/18/2012, 11/19/2013, 12/21/2014, 11/15/2015, 11/07/2017, 12/10/2018   Janssen (J&J) SARS-COV-2 Vaccination 05/30/2019    Pneumococcal Conjugate-13 07/08/2020   Tdap 11/19/2013   Zoster Recombinant(Shingrix) 04/10/2018, 07/10/2018    TDAP status: Up to date  Flu Vaccine status: Up to date  Pneumococcal vaccine status: Due, Education has been provided regarding the importance of this vaccine. Advised may receive this vaccine at local pharmacy or Health Dept. Aware to provide a copy of the vaccination record if obtained from local pharmacy or Health Dept. Verbalized acceptance and understanding.  Covid-19 vaccine status: Information provided on how to obtain vaccines.   Qualifies for Shingles Vaccine? Yes   Zostavax completed No   Shingrix Completed?: Yes  Screening Tests Health Maintenance  Topic Date Due   Colonoscopy  03/02/2018   COVID-19 Vaccine (2 - Janssen risk series) 06/27/2019   Pneumonia Vaccine 17+ Years old (2 of 2 - PPSV23 or PCV20) 09/02/2020   MAMMOGRAM  08/03/2021   INFLUENZA VACCINE  12/22/2023 (Originally 09/28/2022)   DTaP/Tdap/Td (2 - Td or Tdap) 11/20/2023   DEXA SCAN  Completed   Hepatitis C Screening  Completed   Zoster Vaccines- Shingrix  Completed   HPV VACCINES  Aged Out    Health Maintenance  Health Maintenance Due  Topic Date Due   Colonoscopy  03/02/2018   COVID-19 Vaccine (2 - Janssen risk series) 06/27/2019   Pneumonia  Vaccine 25+ Years old (2 of 2 - PPSV23 or PCV20) 09/02/2020   MAMMOGRAM  08/03/2021    Colorectal cancer screen: pt declines at this time  Mammogram status: Ordered 09/13/21. Pt provided with contact info and advised to call to schedule appt.   Bone Density status: Completed 06/25/17. Results reflect: Bone density results: NORMAL. Repeat every 2 years.  Lung Cancer Screening: (Low Dose CT Chest recommended if Age 34-80 years, 20 pack-year currently smoking OR have quit w/in 15years.) does not qualify.   Additional Screening:  Hepatitis C Screening: does qualify; Completed 01/04/21  Vision Screening: Recommended annual ophthalmology exams for  early detection of glaucoma and other disorders of the eye. Is the patient up to date with their annual eye exam?  Yes  Who is the provider or what is the name of the office in which the patient attends annual eye exams? [facility] If pt is not established with a provider, would they like to be referred to a provider to establish care? No .   Dental Screening: Recommended annual dental exams for proper oral hygiene  Diabetic Foot Exam: N/a  Community Resource Referral / Chronic Care Management: CRR required this visit?  No   CCM required this visit?  No     Plan:     I have personally reviewed and noted the following in the patient's chart:   Medical and social history Use of alcohol, tobacco or illicit drugs  Current medications and supplements including opioid prescriptions. Patient is not currently taking opioid prescriptions. Functional ability and status Nutritional status Physical activity Advanced directives List of other physicians Hospitalizations, surgeries, and ER visits in previous 12 months Vitals Screenings to include cognitive, depression, and falls Referrals and appointments  In addition, I have reviewed and discussed with patient certain preventive protocols, quality metrics, and best practice recommendations. A written personalized care plan for preventive services as well as general preventive health recommendations were provided to patient.     Donne Anon, CMA   09/06/2022   After Visit Summary: (MyChart) Due to this being a telephonic visit, the after visit summary with patients personalized plan was offered to patient via MyChart   Nurse Notes: None

## 2022-09-06 NOTE — Patient Instructions (Signed)
Ariel [name] , Thank you for taking time to come for your Medicare Wellness Visit. I appreciate your ongoing commitment to your health goals. Please review the following plan we discussed and let me know if I can assist you in the future.     This is a list of the screening recommended for you and due dates:  Health Maintenance  Topic Date Due   Colon Cancer Screening  03/02/2018   COVID-19 Vaccine (2 - Janssen risk series) 06/27/2019   Pneumonia Vaccine (2 of 2 - PPSV23 or PCV20) 09/02/2020   Mammogram  08/03/2021   Flu Shot  12/22/2023*   DTaP/Tdap/Td vaccine (2 - Td or Tdap) 11/20/2023   DEXA scan (bone density measurement)  Completed   Hepatitis C Screening  Completed   Zoster (Shingles) Vaccine  Completed   HPV Vaccine  Aged Out  *Topic was postponed. The date shown is not the original due date.    Next appointment: Follow up in one year for your annual wellness visit.   Preventive Care 60 Years and Older, Female Preventive care refers to lifestyle choices and visits with your health care provider that can promote health and wellness. What does preventive care include? A yearly physical exam. This is also called an annual well check. Dental exams once or twice a year. Routine eye exams. Ask your health care provider how often you should have your eyes checked. Personal lifestyle choices, including: Daily care of your teeth and gums. Regular physical activity. Eating a healthy diet. Avoiding tobacco and drug use. Limiting alcohol use. Practicing safe sex. Taking low-dose aspirin every day. Taking vitamin and mineral supplements as recommended by your health care provider. What happens during an annual well check? The services and screenings done by your health care provider during your annual well check will depend on your age, overall health, lifestyle risk factors, and family history of disease. Counseling  Your health care provider may ask you questions about  your: Alcohol use. Tobacco use. Drug use. Emotional well-being. Home and relationship well-being. Sexual activity. Eating habits. History of falls. Memory and ability to understand (cognition). Work and work Astronomer. Reproductive health. Screening  You may have the following tests or measurements: Height, weight, and BMI. Blood pressure. Lipid and cholesterol levels. These may be checked every 5 years, or more frequently if you are over 57 years old. Skin check. Lung cancer screening. You may have this screening every year starting at age 68 if you have a 30-pack-year history of smoking and currently smoke or have quit within the past 15 years. Fecal occult blood test (FOBT) of the stool. You may have this test every year starting at age 63. Flexible sigmoidoscopy or colonoscopy. You may have a sigmoidoscopy every 5 years or a colonoscopy every 10 years starting at age 36. Hepatitis C blood test. Hepatitis B blood test. Sexually transmitted disease (STD) testing. Diabetes screening. This is done by checking your blood sugar (glucose) after you have not eaten for a while (fasting). You may have this done every 1-3 years. Bone density scan. This is done to screen for osteoporosis. You may have this done starting at age 62. Mammogram. This may be done every 1-2 years. Talk to your health care provider about how often you should have regular mammograms. Talk with your health care provider about your test results, treatment options, and if necessary, the need for more tests. Vaccines  Your health care provider may recommend certain vaccines, such as: Influenza vaccine. This  is recommended every year. Tetanus, diphtheria, and acellular pertussis (Tdap, Td) vaccine. You may need a Td booster every 10 years. Zoster vaccine. You may need this after age 4. Pneumococcal 13-valent conjugate (PCV13) vaccine. One dose is recommended after age 78. Pneumococcal polysaccharide (PPSV23) vaccine.  One dose is recommended after age 71. Talk to your health care provider about which screenings and vaccines you need and how often you need them. This information is not intended to replace advice given to you by your health care provider. Make sure you discuss any questions you have with your health care provider. Document Released: 03/12/2015 Document Revised: 11/03/2015 Document Reviewed: 12/15/2014 Elsevier Interactive Patient Education  2017 [employer].  Fall Prevention in the Home Falls can cause injuries. They can happen to people of all ages. There are many things you can do to make your home safe and to help prevent falls. What can I do on the outside of my home? Regularly fix the edges of walkways and driveways and fix any cracks. Remove anything that might make you trip as you walk through a door, such as a raised step or threshold. Trim any bushes or trees on the path to your home. Use bright outdoor lighting. Clear any walking paths of anything that might make someone trip, such as rocks or tools. Regularly check to see if handrails are loose or broken. Make sure that both sides of any steps have handrails. Any raised decks and porches should have guardrails on the edges. Have any leaves, snow, or ice cleared regularly. Use sand or salt on walking paths during winter. Clean up any spills in your garage right away. This includes oil or grease spills. What can I do in the bathroom? Use night lights. Install grab bars by the toilet and in the tub and shower. Do not use towel bars as grab bars. Use non-skid mats or decals in the tub or shower. If you need to sit down in the shower, use a plastic, non-slip stool. Keep the floor dry. Clean up any water that spills on the floor as soon as it happens. Remove soap buildup in the tub or shower regularly. Attach bath mats securely with double-sided non-slip rug tape. Do not have throw rugs and other things on the floor that can make  you trip. What can I do in the bedroom? Use night lights. Make sure that you have a light by your bed that is easy to reach. Do not use any sheets or blankets that are too big for your bed. They should not hang down onto the floor. Have a firm chair that has side arms. You can use this for support while you get dressed. Do not have throw rugs and other things on the floor that can make you trip. What can I do in the kitchen? Clean up any spills right away. Avoid walking on wet floors. Keep items that you use a lot in easy-to-reach places. If you need to reach something above you, use a strong step stool that has a grab bar. Keep electrical cords out of the way. Do not use floor polish or wax that makes floors slippery. If you must use wax, use non-skid floor wax. Do not have throw rugs and other things on the floor that can make you trip. What can I do with my stairs? Do not leave any items on the stairs. Make sure that there are handrails on both sides of the stairs and use them. Fix handrails that  are broken or loose. Make sure that handrails are as long as the stairways. Check any carpeting to make sure that it is firmly attached to the stairs. Fix any carpet that is loose or worn. Avoid having throw rugs at the top or bottom of the stairs. If you do have throw rugs, attach them to the floor with carpet tape. Make sure that you have a light switch at the top of the stairs and the bottom of the stairs. If you do not have them, ask someone to add them for you. What else can I do to help prevent falls? Wear shoes that: Do not have high heels. Have rubber bottoms. Are comfortable and fit you well. Are closed at the toe. Do not wear sandals. If you use a stepladder: Make sure that it is fully opened. Do not climb a closed stepladder. Make sure that both sides of the stepladder are locked into place. Ask someone to hold it for you, if possible. Clearly mark and make sure that you can  see: Any grab bars or handrails. First and last steps. Where the edge of each step is. Use tools that help you move around (mobility aids) if they are needed. These include: Canes. Walkers. Scooters. Crutches. Turn on the lights when you go into a dark area. Replace any light bulbs as soon as they burn out. Set up your furniture so you have a clear path. Avoid moving your furniture around. If any of your floors are uneven, fix them. If there are any pets around you, be aware of where they are. Review your medicines with your doctor. Some medicines can make you feel dizzy. This can increase your chance of falling. Ask your doctor what other things that you can do to help prevent falls. This information is not intended to replace advice given to you by your health care provider. Make sure you discuss any questions you have with your health care provider. Document Released: 12/10/2008 Document Revised: 07/22/2015 Document Reviewed: 03/20/2014 Elsevier Interactive Patient Education  2017 [employer].

## 2022-09-08 ENCOUNTER — Inpatient Hospital Stay: Payer: Medicare Other

## 2022-09-08 ENCOUNTER — Inpatient Hospital Stay: Payer: Medicare Other | Admitting: Hematology & Oncology

## 2022-09-11 ENCOUNTER — Telehealth: Payer: Self-pay

## 2022-09-11 NOTE — Telephone Encounter (Signed)
Patient called stating she still has a rash and is requesting to also come off her famvir for a few days to see if that helps as well. She believes the sulfa drug and famvir are what is causing the rash. Checked with Maralyn Sago covington who requested patient come in for evaluation, patient unable to due to transportation. Okay with sara covington for pt to try stopping famvir for a few days to see if it helps but for pt to call [facility] to let them know since they placed her on that. Patient confirmed and will call back in a few days to let us know how her rash is.

## 2022-09-21 ENCOUNTER — Other Ambulatory Visit (HOSPITAL_COMMUNITY): Payer: Self-pay

## 2022-09-25 ENCOUNTER — Inpatient Hospital Stay: Payer: Medicare Other

## 2022-09-25 ENCOUNTER — Encounter: Payer: Self-pay | Admitting: Medical Oncology

## 2022-09-25 ENCOUNTER — Inpatient Hospital Stay: Payer: Medicare Other | Admitting: Medical Oncology

## 2022-09-25 ENCOUNTER — Other Ambulatory Visit: Payer: Self-pay

## 2022-09-25 VITALS — BP 128/45 | HR 63 | Temp 98.3°F | Resp 17 | Ht 67.0 in | Wt 156.0 lb

## 2022-09-25 DIAGNOSIS — C8510 Unspecified B-cell lymphoma, unspecified site: Secondary | ICD-10-CM

## 2022-09-25 DIAGNOSIS — Z79899 Other long term (current) drug therapy: Secondary | ICD-10-CM | POA: Diagnosis not present

## 2022-09-25 DIAGNOSIS — D509 Iron deficiency anemia, unspecified: Secondary | ICD-10-CM | POA: Diagnosis not present

## 2022-09-25 DIAGNOSIS — C8339 Diffuse large B-cell lymphoma, extranodal and solid organ sites: Secondary | ICD-10-CM | POA: Diagnosis not present

## 2022-09-25 DIAGNOSIS — Z9221 Personal history of antineoplastic chemotherapy: Secondary | ICD-10-CM | POA: Diagnosis not present

## 2022-09-25 LAB — CBC WITH DIFFERENTIAL (CANCER CENTER ONLY)
Abs Immature Granulocytes: 0.03 10*3/uL (ref 0.00–0.07)
Basophils Absolute: 0 10*3/uL (ref 0.0–0.1)
Basophils Relative: 1 %
Eosinophils Absolute: 0 10*3/uL (ref 0.0–0.5)
Eosinophils Relative: 1 %
HCT: 38.7 % (ref 36.0–46.0)
Hemoglobin: 12.8 g/dL (ref 12.0–15.0)
Immature Granulocytes: 1 %
Lymphocytes Relative: 37 %
Lymphs Abs: 1.6 10*3/uL (ref 0.7–4.0)
MCH: 31.8 pg (ref 26.0–34.0)
MCHC: 33.1 g/dL (ref 30.0–36.0)
MCV: 96 fL (ref 80.0–100.0)
Monocytes Absolute: 1 10*3/uL (ref 0.1–1.0)
Monocytes Relative: 22 %
Neutro Abs: 1.7 10*3/uL (ref 1.7–7.7)
Neutrophils Relative %: 38 %
Platelet Count: 147 10*3/uL — ABNORMAL LOW (ref 150–400)
RBC: 4.03 MIL/uL (ref 3.87–5.11)
RDW: 12.1 % (ref 11.5–15.5)
WBC Count: 4.4 10*3/uL (ref 4.0–10.5)
nRBC: 0 % (ref 0.0–0.2)

## 2022-09-25 LAB — CMP (CANCER CENTER ONLY)
ALT: 12 U/L (ref 0–44)
AST: 15 U/L (ref 15–41)
Albumin: 4.3 g/dL (ref 3.5–5.0)
Alkaline Phosphatase: 58 U/L (ref 38–126)
Anion gap: 9 (ref 5–15)
BUN: 11 mg/dL (ref 8–23)
CO2: 28 mmol/L (ref 22–32)
Calcium: 9.9 mg/dL (ref 8.9–10.3)
Chloride: 101 mmol/L (ref 98–111)
Creatinine: 0.71 mg/dL (ref 0.44–1.00)
GFR, Estimated: >60 mL/min (ref >60)
Glucose, Bld: 95 mg/dL (ref 70–99)
Potassium: 4.1 mmol/L (ref 3.5–5.1)
Sodium: 138 mmol/L (ref 135–145)
Total Bilirubin: 0.6 mg/dL (ref 0.3–1.2)
Total Protein: 6.2 g/dL — ABNORMAL LOW (ref 6.5–8.1)

## 2022-09-25 LAB — LACTATE DEHYDROGENASE: LDH: 175 U/L (ref 98–192)

## 2022-09-25 NOTE — Patient Instructions (Signed)
Implanted [place], Care After The following information offers guidance on how to care for yourself after your procedure. Your health care provider may also give you more specific instructions. If you have problems or questions, contact your health care provider. What can I expect after the procedure? After the procedure, it is common to have: Soreness or pain near your incision. Some swelling or bruising near your incision. Follow these instructions at home: Medicines Take over-the-counter and prescription medicines only as told by your health care provider. If you were prescribed an antibiotic medicine, take it as told by your health care provider. Do not stop taking the antibiotic even if you start to feel better. Bathing Do not take baths, swim, or use a hot tub until your health care provider approves. Ask your health care provider if you can take showers. You may only be allowed to take sponge baths. Incision care  Follow instructions from your health care provider about how to take care of your incision. Make sure you: Wash your hands with soap and water for at least 20 seconds before and after you change your bandage (dressing). If soap and water are not available, use hand sanitizer. Change your dressing as told by your health care provider. Keep your dressing dry. Leave stitches (sutures), skin glue, or adhesive strips in place. These skin closures may need to stay in place for 2 weeks or longer. If adhesive strip edges start to loosen and curl up, you may trim the loose edges. Do not remove adhesive strips completely unless your health care provider tells you to do that. Check your incision area every day for signs of infection. Check for: More redness, swelling, or pain. More fluid or blood. Warmth. Pus or a bad smell. Activity Return to your normal activities as told by your health care provider. Ask your health care provider what activities are safe for you. You may have  to avoid lifting. Ask your health care provider how much you can safely lift. Do not do activities that involve lifting your arms over your head. Driving  If you were given a sedative during the procedure, it can affect you for several hours. Do not drive or operate machinery until your health care provider says that it is safe. If you did not receive a sedative, ask your health care provider when it is safe to drive. General instructions Do not use any products that contain nicotine or tobacco. These products include cigarettes, chewing tobacco, and vaping devices, such as e-cigarettes. These can delay healing after surgery. If you need help quitting, ask your health care provider. Keep all follow-up visits. This is important. Contact a health care provider if: You have a fever or chills. You have more redness, swelling, or pain around your incision. You have more fluid or blood coming from your incision. Your incision feels warm to the touch. You have pus or a bad smell coming from your incision. You have pain that is not relieved by your pain medicine. Get help right away if: You have chest pain. You have difficulty breathing. These symptoms may be an emergency. Get help right away. Call 911. Do not wait to see if the symptoms will go away. Do not drive yourself to the hospital. Summary After the procedure, it is common to have pain, soreness, swelling, or bruising near your incision. If you were prescribed an antibiotic medicine, take it as told by your health care provider. Do not stop taking the antibiotic even if you   start to feel better. If you were given a sedative during the procedure, it can affect you for several hours. Do not drive or operate machinery until your health care provider says that it is safe. Return to your normal activities as told by your health care provider. Ask your health care provider what activities are safe for you. This information is not intended to  replace advice given to you by your health care provider. Make sure you discuss any questions you have with your health care provider. Document Revised: 08/17/2020 Document Reviewed: 08/17/2020 Elsevier Patient Education  2024 [employer] Inc[employer]  

## 2022-09-25 NOTE — Progress Notes (Signed)
Hematology and Oncology Follow Up Visit  Ariel [name] [id number] 12-30-1955 67 y.o. 09/25/2022   Principle Diagnosis:  Diffuse large cell non-Hodgkin's lymphoma-CNS involvement -- relapsed   Past Therapy: Status post chemotherapy with R-ICE/R-MTV --  s/p cycle #4 MATRIX -- s/p cycle #4 -- start on 05/02/2021 --completed on 08/06/2021 Neulasta 6 mg subcu post chemotherapy MTV- s/p cycle #1 -- given on 01/22/204   Current Therapy:        Imbruvica/Rituxan -- started 05/02/2022, s/p cycle #5 -DC on 07/16/2022 Imbruvica- oral- current   Interim History:  Ariel [name] is here today for an early visit.    At her last visit she had a maculopapular rash that was thought to be due to Bactrim. This medication was held, she was given topical steroid(poor tolerance to oral steroid) and rash has significantly improved.   She also had two teeth extracted since her last visit. Healing really well. Prior to this her [name] was held. She was due to restart this medication, 1 tablets daily, on 09/14/2022.   She has had no weight loss or weight gain.  She is eating well.  She has had no cough or shortness of breath.  Wt Readings from Last 3 Encounters:  08/29/22 156 lb (70.8 kg)  07/26/22 157 lb (71.2 kg)  06/29/22 150 lb (68 kg)   Overall, I would say that her performance status is probably ECOG 1.   Medications:  Allergies as of 09/25/2022       Reactions   Decadron [dexamethasone] Other (See Comments)   Leg weakness Hallucinations   Plavix [clopidogrel] Hives, Swelling, Other (See Comments)   Lip swelling   Prednisone Other (See Comments)   Steroids caused psychological issues  Hallucinations   Codeine Nausea And Vomiting   Sulfa Antibiotics Rash        Medication List        Accurate as of September 25, 2022 11:19 AM. If you have any questions, ask your nurse or doctor.          acetaminophen 325 MG tablet Commonly known as: TYLENOL Take 1,300 mg by mouth daily as needed  for headache, fever, moderate pain or mild pain.   famciclovir 250 MG tablet Commonly known as: FAMVIR Take 250 mg by mouth daily.   ibrutinib 420 MG tablet Commonly known as: IMBRUVICA Take 2 tablets (840 mg total) by mouth daily. Take with a glass of water.   lidocaine-prilocaine cream Commonly known as: EMLA Apply to affected area once   naphazoline-glycerin 0.012-0.25 % Soln Commonly known as: CLEAR EYES REDNESS Place 2 drops into both eyes 4 (four) times daily as needed for eye irritation.        Allergies:  Allergies  Allergen Reactions   Decadron [Dexamethasone] Other (See Comments)    Leg weakness Hallucinations   Plavix [Clopidogrel] Hives, Swelling and Other (See Comments)    Lip swelling    Prednisone Other (See Comments)    Steroids caused psychological issues  Hallucinations   Codeine Nausea And Vomiting   Sulfa Antibiotics Rash    Past Medical History, Surgical history, Social history, and Family History were reviewed and updated.  Review of Systems: Review of Systems  Constitutional: Negative.   HENT: Negative.    Eyes: Negative.   Respiratory: Negative.    Cardiovascular: Negative.   Gastrointestinal: Negative.   Genitourinary: Negative.   Musculoskeletal: Negative.   Skin: Negative.   Neurological: Negative.   Endo/Heme/Allergies: Negative.   Psychiatric/Behavioral: Negative.  Physical Exam:  vitals were not taken for this visit.   Wt Readings from Last 3 Encounters:  08/29/22 156 lb (70.8 kg)  07/26/22 157 lb (71.2 kg)  06/29/22 150 lb (68 kg)    Physical Activity: Sufficiently Active (09/05/2022)   Exercise Vital Sign    Days of Exercise per Week: 6 days    Minutes of Exercise per Session: 40 min   Physical Exam Vitals reviewed.  HENT:     Head: Normocephalic and atraumatic.  Eyes:     Pupils: Pupils are equal, round, and reactive to light.  Cardiovascular:     Rate and Rhythm: Normal rate and regular rhythm.      Heart sounds: Normal heart sounds.  Pulmonary:     Effort: Pulmonary effort is normal.     Breath sounds: Normal breath sounds.  Abdominal:     General: Bowel sounds are normal.     Palpations: Abdomen is soft.  Musculoskeletal:        General: No tenderness or deformity. Normal range of motion.     Cervical back: Normal range of motion.  Lymphadenopathy:     Cervical: No cervical adenopathy.  Skin:    General: Skin is warm and dry.     Findings: No erythema or rash.     Comments: Flat slightly crusted maculopapular rash of chest, arms, face  Neurological:     Mental Status: She is alert and oriented to person, place, and time.  Psychiatric:        Behavior: Behavior normal.        Thought Content: Thought content normal.        Judgment: Judgment normal.    Lab Results  Component Value Date   WBC 4.4 09/25/2022   HGB 12.8 09/25/2022   HCT 38.7 09/25/2022   MCV 96.0 09/25/2022   PLT 147 (L) 09/25/2022   Lab Results  Component Value Date   FERRITIN 796 (H) 07/26/2022   IRON 67 07/26/2022   TIBC 291 07/26/2022   UIBC 224 07/26/2022   IRONPCTSAT 23 07/26/2022   Lab Results  Component Value Date   RETICCTPCT 2.4 11/15/2021   RBC 4.03 09/25/2022   No results found for: "KPAFRELGTCHN", "LAMBDASER", "KAPLAMBRATIO" No results found for: "IGGSERUM", "IGA", "IGMSERUM" No results found for: "TOTALPROTELP", "ALBUMINELP", "A1GS", "A2GS", "BETS", "BETA2SER", "GAMS", "MSPIKE", "SPEI"   Chemistry      Component Value Date/Time   NA 139 09/04/2022 0930   K 3.8 09/04/2022 0930   CL 102 09/04/2022 0930   CO2 30 09/04/2022 0930   BUN 13 09/04/2022 0930   CREATININE 0.96 09/04/2022 0930   CREATININE 0.68 11/18/2012 1439      Component Value Date/Time   CALCIUM 10.1 09/04/2022 0930   ALKPHOS 51 09/04/2022 0930   AST 21 09/04/2022 0930   ALT 21 09/04/2022 0930   BILITOT 0.5 09/04/2022 0930       Impression and Plan: Ariel [name] is a very pleasant 67 yo caucasian female  with iron deficiency anemia and then was diagnosed with diffuse large cell non-Hodgkin's lymphoma that had traveled to her brain.  When she saw the specialist at [facility], they did not feel like she was a candidate for CAR-T therapy at the present time because she was doing so well on the Imbruvica/Rituxan.  Now on Imbruvica. They did recommend a PET scan which was completed on 09/01/2022 which showed NED. She also has a MR brain on 09/05/2022 which showed also showed no evidence of  residual or active disease.   Counts look good and she is healing well from her dental extraction. She does have upcoming cataract surgery on Monday. Discussed with Dr. Myna Hidalgo and she will hold the Imbruvica starting on Friday and restarting it 1 week after her cataract procedure. She will return to the one tablet per day dosing.    RTC 1 month MD only, labs (CBC w/, CMP)-[place]     Rushie Chestnut, PA-C 7/29/202411:19 AM

## 2022-09-26 ENCOUNTER — Other Ambulatory Visit: Payer: Self-pay

## 2022-09-28 ENCOUNTER — Other Ambulatory Visit: Payer: Self-pay

## 2022-10-02 DIAGNOSIS — H25812 Combined forms of age-related cataract, left eye: Secondary | ICD-10-CM | POA: Diagnosis not present

## 2022-10-02 DIAGNOSIS — H2512 Age-related nuclear cataract, left eye: Secondary | ICD-10-CM | POA: Diagnosis not present

## 2022-10-03 ENCOUNTER — Other Ambulatory Visit (HOSPITAL_COMMUNITY): Payer: Self-pay

## 2022-10-10 ENCOUNTER — Inpatient Hospital Stay: Payer: Medicare Other

## 2022-10-10 ENCOUNTER — Other Ambulatory Visit: Payer: Medicare Other

## 2022-10-10 ENCOUNTER — Ambulatory Visit: Payer: Medicare Other | Admitting: Hematology & Oncology

## 2022-10-16 ENCOUNTER — Telehealth: Payer: Self-pay

## 2022-10-16 ENCOUNTER — Inpatient Hospital Stay: Payer: Medicare Other | Attending: Internal Medicine

## 2022-10-16 ENCOUNTER — Telehealth: Payer: Self-pay | Admitting: *Deleted

## 2022-10-16 DIAGNOSIS — C8510 Unspecified B-cell lymphoma, unspecified site: Secondary | ICD-10-CM

## 2022-10-16 DIAGNOSIS — R110 Nausea: Secondary | ICD-10-CM | POA: Diagnosis not present

## 2022-10-16 DIAGNOSIS — R8290 Unspecified abnormal findings in urine: Secondary | ICD-10-CM | POA: Diagnosis not present

## 2022-10-16 DIAGNOSIS — C8339 Diffuse large B-cell lymphoma, extranodal and solid organ sites: Secondary | ICD-10-CM | POA: Diagnosis not present

## 2022-10-16 LAB — CBC WITH DIFFERENTIAL (CANCER CENTER ONLY)
Abs Immature Granulocytes: 0.01 10*3/uL (ref 0.00–0.07)
Basophils Absolute: 0 10*3/uL (ref 0.0–0.1)
Basophils Relative: 0 %
Eosinophils Absolute: 0.1 10*3/uL (ref 0.0–0.5)
Eosinophils Relative: 2 %
HCT: 39.3 % (ref 36.0–46.0)
Hemoglobin: 12.9 g/dL (ref 12.0–15.0)
Immature Granulocytes: 0 %
Lymphocytes Relative: 33 %
Lymphs Abs: 1.4 10*3/uL (ref 0.7–4.0)
MCH: 31.7 pg (ref 26.0–34.0)
MCHC: 32.8 g/dL (ref 30.0–36.0)
MCV: 96.6 fL (ref 80.0–100.0)
Monocytes Absolute: 0.6 10*3/uL (ref 0.1–1.0)
Monocytes Relative: 13 %
Neutro Abs: 2.2 10*3/uL (ref 1.7–7.7)
Neutrophils Relative %: 52 %
Platelet Count: 165 10*3/uL (ref 150–400)
RBC: 4.07 MIL/uL (ref 3.87–5.11)
RDW: 12.3 % (ref 11.5–15.5)
WBC Count: 4.2 10*3/uL (ref 4.0–10.5)
nRBC: 0 % (ref 0.0–0.2)

## 2022-10-16 LAB — CMP (CANCER CENTER ONLY)
ALT: 12 U/L (ref 0–44)
AST: 11 U/L — ABNORMAL LOW (ref 15–41)
Albumin: 4.4 g/dL (ref 3.5–5.0)
Alkaline Phosphatase: 53 U/L (ref 38–126)
Anion gap: 5 (ref 5–15)
BUN: 16 mg/dL (ref 8–23)
CO2: 34 mmol/L — ABNORMAL HIGH (ref 22–32)
Calcium: 9.7 mg/dL (ref 8.9–10.3)
Chloride: 99 mmol/L (ref 98–111)
Creatinine: 0.81 mg/dL (ref 0.44–1.00)
GFR, Estimated: >60 mL/min (ref >60)
Glucose, Bld: 102 mg/dL — ABNORMAL HIGH (ref 70–99)
Potassium: 4.1 mmol/L (ref 3.5–5.1)
Sodium: 138 mmol/L (ref 135–145)
Total Bilirubin: 0.5 mg/dL (ref 0.3–1.2)
Total Protein: 6.5 g/dL (ref 6.5–8.1)

## 2022-10-16 LAB — SAMPLE TO BLOOD BANK

## 2022-10-16 LAB — IRON AND IRON BINDING CAPACITY (CC-WL,HP ONLY)
Iron: 61 ug/dL (ref 28–170)
Saturation Ratios: 22 % (ref 10.4–31.8)
TIBC: 277 ug/dL (ref 250–450)
UIBC: 216 ug/dL (ref 148–442)

## 2022-10-16 LAB — FERRITIN: Ferritin: 944 ng/mL — ABNORMAL HIGH (ref 11–307)

## 2022-10-16 LAB — LACTATE DEHYDROGENASE: LDH: 128 U/L (ref 98–192)

## 2022-10-16 NOTE — Telephone Encounter (Signed)
-----   Message from [name] sent at 10/16/2022  1:03 PM EDT ----- Please call and let her know that the labs look okay so far.  Cindee Lame

## 2022-10-16 NOTE — Telephone Encounter (Signed)
Patient called multiple times requesting to have labs drawn as she has been feeling weak the past day. Reviewed patient complaints with Dr. Myna Hidalgo and orders received. Scheduling aware and appointment made. Pt aware of appointment for labs.

## 2022-10-17 ENCOUNTER — Telehealth: Payer: Self-pay

## 2022-10-17 NOTE — Telephone Encounter (Signed)
-----   Message from [name] sent at 10/16/2022  4:40 PM EDT ----- Call and let her know that the iron level is okay.  Thanks.  Cindee Lame

## 2022-10-23 ENCOUNTER — Telehealth: Payer: Self-pay | Admitting: *Deleted

## 2022-10-23 NOTE — Telephone Encounter (Signed)
This nurse returned patient's phone call regarding nausea, fatigue and joint pain. Patient stated,"I have been in the bed all weekend long due to joint pain and feeling weak. She denies SOB, diarrhea, vomiting, rash, itching, fevers. She tested for Covid-19 and was negative. I reviewed her labs with her and they were all normal. Per Dr. Myna Hidalgo, HOLD the Imbruvica until she sees me this Thursday. She verbalized understanding. Instructed her to continue to hydrate, eat small meals, rest, and have good handwashing. She verbalized understanding.

## 2022-10-26 ENCOUNTER — Other Ambulatory Visit: Payer: Self-pay

## 2022-10-26 ENCOUNTER — Encounter: Payer: Self-pay | Admitting: Hematology & Oncology

## 2022-10-26 ENCOUNTER — Inpatient Hospital Stay (HOSPITAL_BASED_OUTPATIENT_CLINIC_OR_DEPARTMENT_OTHER): Payer: Medicare Other | Admitting: Hematology & Oncology

## 2022-10-26 ENCOUNTER — Inpatient Hospital Stay: Payer: Medicare Other

## 2022-10-26 ENCOUNTER — Other Ambulatory Visit: Payer: Self-pay | Admitting: *Deleted

## 2022-10-26 VITALS — BP 112/54 | HR 72 | Temp 98.3°F | Resp 17 | Ht 67.0 in | Wt 149.0 lb

## 2022-10-26 VITALS — HR 70 | Temp 98.3°F | Resp 18

## 2022-10-26 DIAGNOSIS — N390 Urinary tract infection, site not specified: Secondary | ICD-10-CM

## 2022-10-26 DIAGNOSIS — C8510 Unspecified B-cell lymphoma, unspecified site: Secondary | ICD-10-CM

## 2022-10-26 DIAGNOSIS — Z95828 Presence of other vascular implants and grafts: Secondary | ICD-10-CM

## 2022-10-26 DIAGNOSIS — T451X5A Adverse effect of antineoplastic and immunosuppressive drugs, initial encounter: Secondary | ICD-10-CM

## 2022-10-26 LAB — TSH: TSH: 1.261 u[IU]/mL (ref 0.350–4.500)

## 2022-10-26 LAB — CBC WITH DIFFERENTIAL (CANCER CENTER ONLY)
Abs Immature Granulocytes: 0.08 10*3/uL — ABNORMAL HIGH (ref 0.00–0.07)
Basophils Absolute: 0 10*3/uL (ref 0.0–0.1)
Basophils Relative: 0 %
Eosinophils Absolute: 0 10*3/uL (ref 0.0–0.5)
Eosinophils Relative: 1 %
HCT: 39.7 % (ref 36.0–46.0)
Hemoglobin: 13.8 g/dL (ref 12.0–15.0)
Immature Granulocytes: 1 %
Lymphocytes Relative: 24 %
Lymphs Abs: 1.4 10*3/uL (ref 0.7–4.0)
MCH: 31.8 pg (ref 26.0–34.0)
MCHC: 34.8 g/dL (ref 30.0–36.0)
MCV: 91.5 fL (ref 80.0–100.0)
Monocytes Absolute: 0.8 10*3/uL (ref 0.1–1.0)
Monocytes Relative: 14 %
Neutro Abs: 3.5 10*3/uL (ref 1.7–7.7)
Neutrophils Relative %: 60 %
Platelet Count: 175 10*3/uL (ref 150–400)
RBC: 4.34 MIL/uL (ref 3.87–5.11)
RDW: 11.8 % (ref 11.5–15.5)
WBC Count: 5.8 10*3/uL (ref 4.0–10.5)
nRBC: 0 % (ref 0.0–0.2)

## 2022-10-26 LAB — CMP (CANCER CENTER ONLY)
ALT: 22 U/L (ref 0–44)
AST: 16 U/L (ref 15–41)
Albumin: 4.6 g/dL (ref 3.5–5.0)
Alkaline Phosphatase: 51 U/L (ref 38–126)
Anion gap: 8 (ref 5–15)
BUN: 22 mg/dL (ref 8–23)
CO2: 29 mmol/L (ref 22–32)
Calcium: 9.9 mg/dL (ref 8.9–10.3)
Chloride: 94 mmol/L — ABNORMAL LOW (ref 98–111)
Creatinine: 0.66 mg/dL (ref 0.44–1.00)
GFR, Estimated: >60 mL/min (ref >60)
Glucose, Bld: 119 mg/dL — ABNORMAL HIGH (ref 70–99)
Potassium: 3.8 mmol/L (ref 3.5–5.1)
Sodium: 131 mmol/L — ABNORMAL LOW (ref 135–145)
Total Bilirubin: 0.5 mg/dL (ref 0.3–1.2)
Total Protein: 6.8 g/dL (ref 6.5–8.1)

## 2022-10-26 LAB — URINALYSIS, COMPLETE (UACMP) WITH MICROSCOPIC
Bilirubin Urine: NEGATIVE
Glucose, UA: NEGATIVE mg/dL
Ketones, ur: NEGATIVE mg/dL
Nitrite: NEGATIVE
Protein, ur: 30 mg/dL — AB
Specific Gravity, Urine: 1.02 (ref 1.005–1.030)
WBC, UA: >50 WBC/hpf (ref 0–5)
pH: 6.5 (ref 5.0–8.0)

## 2022-10-26 LAB — LACTATE DEHYDROGENASE: LDH: 138 U/L (ref 98–192)

## 2022-10-26 MED ORDER — ONDANSETRON 8 MG PO TBDP
8.0000 mg | ORAL_TABLET | Freq: Three times a day (TID) | ORAL | 1 refills | Status: AC | PRN
Start: 2022-10-26 — End: ?

## 2022-10-26 MED ORDER — DEXTROSE 5 % IV SOLN
2.0000 g | Freq: Once | INTRAVENOUS | Status: AC
  Administered 2022-10-26: 2 g via INTRAVENOUS
  Filled 2022-10-26: qty 20

## 2022-10-26 MED ORDER — SODIUM CHLORIDE 0.9% FLUSH
10.0000 mL | Freq: Once | INTRAVENOUS | Status: AC
  Administered 2022-10-26: 10 mL via INTRAVENOUS

## 2022-10-26 MED ORDER — HYDROCORTISONE SOD SUC (PF) 100 MG IJ SOLR
50.0000 mg | Freq: Once | INTRAMUSCULAR | Status: AC
  Administered 2022-10-26: 50 mg via INTRAVENOUS

## 2022-10-26 MED ORDER — CIPROFLOXACIN HCL 500 MG PO TABS: 500.0000 mg | ORAL_TABLET | Freq: Two times a day (BID) | ORAL | 0 refills | Status: DC

## 2022-10-26 MED ORDER — ONDANSETRON HCL 4 MG/2ML IJ SOLN
8.0000 mg | Freq: Once | INTRAMUSCULAR | Status: AC
  Administered 2022-10-26: 8 mg via INTRAVENOUS
  Filled 2022-10-26: qty 4

## 2022-10-26 MED ORDER — SODIUM CHLORIDE 0.9 % IV SOLN: Freq: Once | INTRAVENOUS | Status: AC

## 2022-10-26 MED ORDER — HEPARIN SOD (PORK) LOCK FLUSH 100 UNIT/ML IV SOLN
500.0000 [IU] | Freq: Once | INTRAVENOUS | Status: AC
  Administered 2022-10-26: 500 [IU] via INTRAVENOUS

## 2022-10-26 NOTE — Progress Notes (Signed)
Hematology and Oncology Follow Up Visit  Ariel [name] [id number] Jan 11, 1956 67 y.o. 10/26/2022   Principle Diagnosis:  Diffuse large cell non-Hodgkin's lymphoma-CNS involvement -- relapsed   Past Therapy: Status post chemotherapy with R-ICE/R-MTV --  s/p cycle #4 MATRIX -- s/p cycle #4 -- start on 05/02/2021 --completed on 08/06/2021 Neulasta 6 mg subcu post chemotherapy MTV- s/p cycle #1 -- given on 01/22/204   Current Therapy:        Imbruvica/Rituxan -- started 05/02/2022, s/p cycle #5 -DC on 07/16/2022   Interim History:  Ms. Hemmert is here today for follow-up.  She just does not feel well at all.  She comes in a wheelchair.  She has been feeling poorly for the past week or so..  [place].  She says she did well up in the mountains.  She said she may have felt a little tired.  She just has no energy.  She has a lot of nausea.  She clearly is dehydrated.  As always, I do worry about the CNS lymphoma recurring.  We had her stop the Imbruvica for about a week.  I think we can probably hold off on this right now.  She has had no obvious fever..  She has had no diarrhea.  There has been no dysuria or hematuria.  She has had no cough.  There is no shortness of breath.  We will check her TSH.  Also want to check an ACTH.  I think we will probably need to get a brain MRI to make sure there is no recurrence up in the brain.  Currently, I would have to say that her performance status is probably ECOG 2.   Medications:  Allergies as of 10/26/2022       Reactions   Decadron [dexamethasone] Other (See Comments)   Leg weakness Hallucinations   Plavix [clopidogrel] Hives, Swelling, Other (See Comments)   Lip swelling   Prednisone Other (See Comments)   Steroids caused psychological issues  Hallucinations   Sulfa Antibiotics Itching, Rash   Codeine Nausea And Vomiting        Medication List        Accurate as of  October 26, 2022  2:23 PM. If you have any questions, ask your nurse or doctor.          acetaminophen 325 MG tablet Commonly known as: TYLENOL Take 1,300 mg by mouth daily as needed for headache, fever, moderate pain or mild pain.   ibrutinib 420 MG tablet Commonly known as: IMBRUVICA Take 2 tablets (840 mg total) by mouth daily. Take with a glass of water.   lidocaine-prilocaine cream Commonly known as: EMLA Apply to affected area once   naphazoline-glycerin 0.012-0.25 % Soln Commonly known as: CLEAR EYES REDNESS Place 2 drops into both eyes 4 (four) times daily as needed for eye irritation.   ondansetron 8 MG disintegrating tablet Commonly known as: ZOFRAN-ODT Take 1 tablet (8 mg total) by mouth every 8 (eight) hours as needed for nausea or vomiting. Started by: Nurse Misty Stanley C        Allergies:  Allergies  Allergen Reactions   Decadron [Dexamethasone] Other (See Comments)    Leg weakness Hallucinations   Plavix [Clopidogrel] Hives, Swelling and Other (See Comments)    Lip swelling    Prednisone Other (See Comments)    Steroids caused psychological issues  Hallucinations   Sulfa Antibiotics Itching and Rash   Codeine  Nausea And Vomiting    Past Medical History, Surgical history, Social history, and Family History were reviewed and updated.  Review of Systems: Review of Systems  Constitutional: Negative.   HENT: Negative.    Eyes: Negative.   Respiratory: Negative.    Cardiovascular: Negative.   Gastrointestinal: Negative.   Genitourinary: Negative.   Musculoskeletal: Negative.   Skin: Negative.   Neurological: Negative.   Endo/Heme/Allergies: Negative.   Psychiatric/Behavioral: Negative.       Physical Exam:  height is 5\' 7"  (1.702 m) and weight is 149 lb 0.6 oz (67.6 kg). Her oral temperature is 98.3 F (36.8 C). Her blood pressure is 112/54 (abnormal) and her pulse is 72. Her respiration is 17 and oxygen saturation is 99%.   Wt Readings from  Last 3 Encounters:  10/26/22 149 lb 0.6 oz (67.6 kg)  09/25/22 156 lb (70.8 kg)  08/29/22 156 lb (70.8 kg)    Physical Activity: Sufficiently Active (09/05/2022)   Exercise Vital Sign    Days of Exercise per Week: 6 days    Minutes of Exercise per Session: 40 min   Physical Exam Vitals reviewed.  HENT:     Head: Normocephalic and atraumatic.  Eyes:     Pupils: Pupils are equal, round, and reactive to light.  Cardiovascular:     Rate and Rhythm: Normal rate and regular rhythm.     Heart sounds: Normal heart sounds.  Pulmonary:     Effort: Pulmonary effort is normal.     Breath sounds: Normal breath sounds.  Abdominal:     General: Bowel sounds are normal.     Palpations: Abdomen is soft.  Musculoskeletal:        General: No tenderness or deformity. Normal range of motion.     Cervical back: Normal range of motion.  Lymphadenopathy:     Cervical: No cervical adenopathy.  Skin:    General: Skin is warm and dry.     Findings: No erythema or rash.     Comments: Skin exam does show macular lesions.  They are not blanching.  There is no vesicles.  There is no bleeding.  These appear to be more so on the sun exposed areas on her skin.  Neurological:     Mental Status: She is alert and oriented to person, place, and time.  Psychiatric:        Behavior: Behavior normal.        Thought Content: Thought content normal.        Judgment: Judgment normal.      Lab Results  Component Value Date   WBC 5.8 10/26/2022   HGB 13.8 10/26/2022   HCT 39.7 10/26/2022   MCV 91.5 10/26/2022   PLT 175 10/26/2022   Lab Results  Component Value Date   FERRITIN 944 (H) 10/16/2022   IRON 61 10/16/2022   TIBC 277 10/16/2022   UIBC 216 10/16/2022   IRONPCTSAT 22 10/16/2022   Lab Results  Component Value Date   RETICCTPCT 2.4 11/15/2021   RBC 4.34 10/26/2022   No results found for: "KPAFRELGTCHN", "LAMBDASER", "KAPLAMBRATIO" No results found for: "IGGSERUM", "IGA", "IGMSERUM" No  results found for: "TOTALPROTELP", "ALBUMINELP", "A1GS", "A2GS", "BETS", "BETA2SER", "GAMS", "MSPIKE", "SPEI"   Chemistry      Component Value Date/Time   NA 131 (L) 10/26/2022 1154   K 3.8 10/26/2022 1154   CL 94 (L) 10/26/2022 1154   CO2 29 10/26/2022 1154   BUN 22 10/26/2022 1154   CREATININE 0.66 10/26/2022  1154   CREATININE 0.68 11/18/2012 1439      Component Value Date/Time   CALCIUM 9.9 10/26/2022 1154   ALKPHOS 51 10/26/2022 1154   AST 16 10/26/2022 1154   ALT 22 10/26/2022 1154   BILITOT 0.5 10/26/2022 1154       Impression and Plan: Ms. Kelly is a very pleasant 67 yo caucasian female with iron deficiency anemia and then was diagnosed with diffuse large cell non-Hodgkin's lymphoma that had traveled to her brain.  I am very amazed  that she has done so well.  When she saw the specialist at [facility], they did not feel like she was a candidate for CAR-T therapy at the present time because she was doing so well on the Imbruvica/Rituxan.    Her last PET scan was done on 09/01/2022.  This looked fantastic.  I suspect that we actually might be looking at a UTI.  Her urine looks cloudy.  There is a lot of white cells and bacteria.  We are sending off a urine culture on her.  I am going to give her a dose of Rocephin in the office and then we will get her on some probably Cipro as an outpatient.  I may also consider Augmentin for her.  I am also going to give her a dose of hydrocortisone.  I do worry about adrenal insufficiency.  We will give her 50 mg of hydrocortisone.  I did give her some Zofran.  She has nausea.  She feels a whole lot better after the Zofran.  She will go home on some oral Zofran.  We called this in for her.  Again, we will see about the MRI of the brain.  We will try to get this done tomorrow.  For right now, I need to get her back relatively quickly.  I would like to have her come back in about 3 weeks and we will see how she feels.    [name],  MD 8/29/20242:23 PM

## 2022-10-26 NOTE — Patient Instructions (Signed)

## 2022-10-26 NOTE — Patient Instructions (Signed)
Dehydration, Adult Dehydration is a condition in which there is not enough water or other fluids in the body. This happens when a person loses more fluids than they take in. Important organs cannot work right without the right amount of fluids. Any loss of fluids from the body can cause dehydration. Dehydration can be mild, worse, or very bad. It should be treated right away to keep it from getting very bad. What are the causes? Conditions that cause loss of water in the body. They include: Watery poop (diarrhea). Vomiting. Sweating a lot. Fever. Infection. Peeing (urinating) a lot. Not drinking enough fluids. Certain medicines, such as medicines that take extra fluid out of the body (diuretics). Lack of safe drinking water. Not being able to get enough water and food. What increases the risk? Having a long-term (chronic) illness that has not been treated the right way, such as: Diabetes. Heart disease. Kidney disease. Being 65 years of age or older. Having a disability. Living in a place that is high above the ground or sea (high in altitude). The thinner, drier air causes more fluid loss. Doing exercises that put stress on your body for a long time. Being active when in hot places. What are the signs or symptoms? Symptoms of dehydration depend on how bad it is. Mild or worse dehydration Thirst. Dry lips or dry mouth. Feeling dizzy or light-headed. Muscle cramps. Passing little pee or dark pee. Pee may be the color of tea. Headache. Very bad dehydration Changes in skin. Skin may: Be cold to the touch (clammy). Be blotchy or pale. Not go back to normal right after you pinch it and let it go. Little or no tears, pee, or sweat. Fast breathing. Low blood pressure. Weak pulse. Pulse that is more than 100 beats a minute when you are sitting still. Other changes, such as: Feeling very thirsty. Eyes that look hollow (sunken). Cold hands and feet. Being confused. Being very  tired (lethargic) or having trouble waking from sleep. Losing weight. Loss of consciousness. How is this treated? Treatment for this condition depends on how bad your dehydration is. Treatment should start right away. Do not wait until your condition gets very bad. Very bad dehydration is an emergency. You will need to go to a hospital. Mild or worse dehydration can be treated at home. You may be asked to: Drink more fluids. Drink an oral rehydration solution (ORS). This drink gives you the right amount of fluids, salts, and minerals (electrolytes). Very bad dehydration can be treated: With fluids through an IV tube. By correcting low levels of electrolytes in the body. By treating the problem that caused your dehydration. Follow these instructions at home: Oral rehydration solution If told by your doctor, drink an ORS: Make an ORS. Use instructions on the package. Start by drinking small amounts, about  cup (120 mL) every 5-10 minutes. Slowly drink more until you have had the amount that your doctor said to have.  Eating and drinking  Drink enough clear fluid to keep your pee pale yellow. If you were told to drink an ORS, finish the ORS first. Then, start slowly drinking other clear fluids. Drink fluids such as: Water. Do not drink only water. Doing that can make the salt (sodium) level in your body get too low. Water from ice chips you suck on. Fruit juice that you have added water to (diluted). Low-calorie sports drinks. Eat foods that have the right amounts of salts and minerals, such as bananas, oranges, potatoes,   tomatoes, or spinach. Do not drink alcohol. Avoid drinks that have caffeine or sugar. These include:: High-calorie sports drinks. Fruit juice that you did not add water to. Soda. Coffee or energy drinks. Avoid foods that are greasy or have a lot of fat or sugar. General instructions Take over-the-counter and prescription medicines only as told by your doctor. Do  not take sodium tablets. Doing that can make the salt level in your body get too high. Return to your normal activities as told by your doctor. Ask your doctor what activities are safe for you. Keep all follow-up visits. Your doctor may check and change your treatment. Contact a doctor if: You have pain in your belly (abdomen) and the pain: Gets worse. Stays in one place. You have a rash. You have a stiff neck. You get angry or annoyed more easily than normal. You are more tired or have a harder time waking than normal. You feel weak or dizzy. You feel very thirsty. Get help right away if: You have any symptoms of very bad dehydration. You vomit every time you eat or drink. Your vomiting gets worse, does not go away, or you vomit blood or green stuff. You are getting treatment, but symptoms are getting worse. You have a fever. You have a very bad headache. You have: Diarrhea that gets worse or does not go away. Blood in your poop (stool). This may cause poop to look black and tarry. No pee in 6-8 hours. Only a small amount of pee in 6-8 hours, and the pee is very dark. You have trouble breathing. These symptoms may be an emergency. Get help right away. Call 911. Do not wait to see if the symptoms will go away. Do not drive yourself to the hospital. This information is not intended to replace advice given to you by your health care provider. Make sure you discuss any questions you have with your health care provider. Document Revised: 09/12/2021 Document Reviewed: 09/12/2021 [employer] Patient Education  2024 [employer]  

## 2022-10-27 ENCOUNTER — Ambulatory Visit (HOSPITAL_COMMUNITY)
Admission: RE | Admit: 2022-10-27 | Discharge: 2022-10-27 | Disposition: A | Discharge status: Home / Self Care | Payer: Medicare Other | Source: Ambulatory Visit | Attending: Hematology & Oncology | Admitting: Hematology & Oncology

## 2022-10-27 DIAGNOSIS — R5383 Other fatigue: Secondary | ICD-10-CM | POA: Diagnosis not present

## 2022-10-27 DIAGNOSIS — C8510 Unspecified B-cell lymphoma, unspecified site: Secondary | ICD-10-CM

## 2022-10-27 DIAGNOSIS — C8590 Non-Hodgkin lymphoma, unspecified, unspecified site: Secondary | ICD-10-CM | POA: Diagnosis not present

## 2022-10-27 DIAGNOSIS — G9389 Other specified disorders of brain: Secondary | ICD-10-CM | POA: Diagnosis not present

## 2022-10-27 LAB — PROLACTIN: Prolactin: 12.6 ng/mL (ref 3.6–25.2)

## 2022-10-27 LAB — ACTH: C206 ACTH: 8.7 pg/mL (ref 7.2–63.3)

## 2022-10-27 MED ORDER — GADOBUTROL 1 MMOL/ML IV SOLN: 6.0000 mL | Freq: Once | INTRAVENOUS | Status: DC | PRN

## 2022-10-27 MED ORDER — GADOBUTROL 1 MMOL/ML IV SOLN
6.0000 mL | Freq: Once | INTRAVENOUS | Status: AC | PRN
  Administered 2022-10-27: 6 mL via INTRAVENOUS

## 2022-10-28 ENCOUNTER — Other Ambulatory Visit: Payer: Self-pay

## 2022-10-28 LAB — URINE CULTURE: Culture: >=100000 — AB

## 2022-10-29 ENCOUNTER — Inpatient Hospital Stay (HOSPITAL_COMMUNITY)
Admission: EM | Admit: 2022-10-29 | Discharge: 2022-11-08 | DRG: 841 | Disposition: A | Discharge status: Home Health | Payer: Medicare Other | Attending: Internal Medicine | Admitting: Internal Medicine

## 2022-10-29 ENCOUNTER — Other Ambulatory Visit: Payer: Self-pay

## 2022-10-29 ENCOUNTER — Encounter (HOSPITAL_COMMUNITY): Payer: Self-pay

## 2022-10-29 DIAGNOSIS — Z885 Allergy status to narcotic agent status: Secondary | ICD-10-CM

## 2022-10-29 DIAGNOSIS — Z833 Family history of diabetes mellitus: Secondary | ICD-10-CM | POA: Diagnosis not present

## 2022-10-29 DIAGNOSIS — Z888 Allergy status to other drugs, medicaments and biological substances status: Secondary | ICD-10-CM | POA: Diagnosis not present

## 2022-10-29 DIAGNOSIS — E0590 Thyrotoxicosis, unspecified without thyrotoxic crisis or storm: Secondary | ICD-10-CM

## 2022-10-29 DIAGNOSIS — Z811 Family history of alcohol abuse and dependence: Secondary | ICD-10-CM

## 2022-10-29 DIAGNOSIS — N390 Urinary tract infection, site not specified: Secondary | ICD-10-CM | POA: Diagnosis not present

## 2022-10-29 DIAGNOSIS — Z85820 Personal history of malignant melanoma of skin: Secondary | ICD-10-CM | POA: Diagnosis not present

## 2022-10-29 DIAGNOSIS — B379 Candidiasis, unspecified: Secondary | ICD-10-CM | POA: Diagnosis not present

## 2022-10-29 DIAGNOSIS — C8589 Other specified types of non-Hodgkin lymphoma, extranodal and solid organ sites: Secondary | ICD-10-CM | POA: Diagnosis not present

## 2022-10-29 DIAGNOSIS — Y92239 Unspecified place in hospital as the place of occurrence of the external cause: Secondary | ICD-10-CM | POA: Diagnosis not present

## 2022-10-29 DIAGNOSIS — C8510 Unspecified B-cell lymphoma, unspecified site: Secondary | ICD-10-CM | POA: Diagnosis present

## 2022-10-29 DIAGNOSIS — Z96643 Presence of artificial hip joint, bilateral: Secondary | ICD-10-CM | POA: Diagnosis not present

## 2022-10-29 DIAGNOSIS — M79605 Pain in left leg: Secondary | ICD-10-CM | POA: Diagnosis not present

## 2022-10-29 DIAGNOSIS — E876 Hypokalemia: Secondary | ICD-10-CM | POA: Diagnosis not present

## 2022-10-29 DIAGNOSIS — M25551 Pain in right hip: Secondary | ICD-10-CM | POA: Diagnosis not present

## 2022-10-29 DIAGNOSIS — W19XXXA Unspecified fall, initial encounter: Secondary | ICD-10-CM | POA: Diagnosis not present

## 2022-10-29 DIAGNOSIS — R2681 Unsteadiness on feet: Secondary | ICD-10-CM | POA: Diagnosis not present

## 2022-10-29 DIAGNOSIS — R270 Ataxia, unspecified: Secondary | ICD-10-CM

## 2022-10-29 DIAGNOSIS — B9620 Unspecified Escherichia coli [E. coli] as the cause of diseases classified elsewhere: Secondary | ICD-10-CM | POA: Diagnosis present

## 2022-10-29 DIAGNOSIS — M50221 Other cervical disc displacement at C4-C5 level: Secondary | ICD-10-CM | POA: Diagnosis not present

## 2022-10-29 DIAGNOSIS — M79606 Pain in leg, unspecified: Secondary | ICD-10-CM | POA: Diagnosis not present

## 2022-10-29 DIAGNOSIS — Z8249 Family history of ischemic heart disease and other diseases of the circulatory system: Secondary | ICD-10-CM | POA: Diagnosis not present

## 2022-10-29 DIAGNOSIS — M5136 Other intervertebral disc degeneration, lumbar region: Secondary | ICD-10-CM | POA: Diagnosis not present

## 2022-10-29 DIAGNOSIS — Z8673 Personal history of transient ischemic attack (TIA), and cerebral infarction without residual deficits: Secondary | ICD-10-CM

## 2022-10-29 DIAGNOSIS — Z743 Need for continuous supervision: Secondary | ICD-10-CM | POA: Diagnosis not present

## 2022-10-29 DIAGNOSIS — T380X5A Adverse effect of glucocorticoids and synthetic analogues, initial encounter: Secondary | ICD-10-CM | POA: Diagnosis not present

## 2022-10-29 DIAGNOSIS — C8330 Diffuse large B-cell lymphoma, unspecified site: Secondary | ICD-10-CM | POA: Diagnosis not present

## 2022-10-29 DIAGNOSIS — M5137 Other intervertebral disc degeneration, lumbosacral region: Secondary | ICD-10-CM | POA: Diagnosis not present

## 2022-10-29 DIAGNOSIS — C8339 Diffuse large B-cell lymphoma, extranodal and solid organ sites: Secondary | ICD-10-CM | POA: Diagnosis not present

## 2022-10-29 DIAGNOSIS — R112 Nausea with vomiting, unspecified: Secondary | ICD-10-CM | POA: Diagnosis not present

## 2022-10-29 DIAGNOSIS — C7931 Secondary malignant neoplasm of brain: Secondary | ICD-10-CM

## 2022-10-29 DIAGNOSIS — M4802 Spinal stenosis, cervical region: Secondary | ICD-10-CM | POA: Diagnosis not present

## 2022-10-29 DIAGNOSIS — R404 Transient alteration of awareness: Secondary | ICD-10-CM | POA: Diagnosis not present

## 2022-10-29 DIAGNOSIS — Z882 Allergy status to sulfonamides status: Secondary | ICD-10-CM | POA: Diagnosis not present

## 2022-10-29 DIAGNOSIS — Z1231 Encounter for screening mammogram for malignant neoplasm of breast: Secondary | ICD-10-CM

## 2022-10-29 DIAGNOSIS — N3000 Acute cystitis without hematuria: Secondary | ICD-10-CM | POA: Diagnosis not present

## 2022-10-29 DIAGNOSIS — C7951 Secondary malignant neoplasm of bone: Secondary | ICD-10-CM | POA: Diagnosis not present

## 2022-10-29 DIAGNOSIS — K1230 Oral mucositis (ulcerative), unspecified: Secondary | ICD-10-CM | POA: Diagnosis not present

## 2022-10-29 DIAGNOSIS — H532 Diplopia: Secondary | ICD-10-CM | POA: Diagnosis present

## 2022-10-29 DIAGNOSIS — E440 Moderate protein-calorie malnutrition: Secondary | ICD-10-CM | POA: Diagnosis not present

## 2022-10-29 DIAGNOSIS — Z6823 Body mass index (BMI) 23.0-23.9, adult: Secondary | ICD-10-CM

## 2022-10-29 DIAGNOSIS — M47812 Spondylosis without myelopathy or radiculopathy, cervical region: Secondary | ICD-10-CM | POA: Diagnosis not present

## 2022-10-29 DIAGNOSIS — Z825 Family history of asthma and other chronic lower respiratory diseases: Secondary | ICD-10-CM | POA: Diagnosis not present

## 2022-10-29 DIAGNOSIS — R2689 Other abnormalities of gait and mobility: Secondary | ICD-10-CM | POA: Diagnosis present

## 2022-10-29 DIAGNOSIS — R54 Age-related physical debility: Secondary | ICD-10-CM | POA: Diagnosis not present

## 2022-10-29 DIAGNOSIS — C8332 Diffuse large B-cell lymphoma, intrathoracic lymph nodes: Secondary | ICD-10-CM

## 2022-10-29 DIAGNOSIS — R278 Other lack of coordination: Secondary | ICD-10-CM | POA: Diagnosis present

## 2022-10-29 DIAGNOSIS — C8338 Diffuse large B-cell lymphoma, lymph nodes of multiple sites: Secondary | ICD-10-CM

## 2022-10-29 DIAGNOSIS — M47816 Spondylosis without myelopathy or radiculopathy, lumbar region: Secondary | ICD-10-CM | POA: Diagnosis not present

## 2022-10-29 DIAGNOSIS — M545 Low back pain, unspecified: Secondary | ICD-10-CM | POA: Diagnosis not present

## 2022-10-29 DIAGNOSIS — R269 Unspecified abnormalities of gait and mobility: Secondary | ICD-10-CM | POA: Diagnosis not present

## 2022-10-29 DIAGNOSIS — R110 Nausea: Secondary | ICD-10-CM | POA: Diagnosis not present

## 2022-10-29 DIAGNOSIS — E222 Syndrome of inappropriate secretion of antidiuretic hormone: Secondary | ICD-10-CM | POA: Diagnosis not present

## 2022-10-29 DIAGNOSIS — Z79899 Other long term (current) drug therapy: Secondary | ICD-10-CM

## 2022-10-29 DIAGNOSIS — R9431 Abnormal electrocardiogram [ECG] [EKG]: Secondary | ICD-10-CM | POA: Diagnosis not present

## 2022-10-29 DIAGNOSIS — E7800 Pure hypercholesterolemia, unspecified: Secondary | ICD-10-CM | POA: Diagnosis not present

## 2022-10-29 DIAGNOSIS — Z0000 Encounter for general adult medical examination without abnormal findings: Secondary | ICD-10-CM | POA: Diagnosis not present

## 2022-10-29 DIAGNOSIS — G9589 Other specified diseases of spinal cord: Secondary | ICD-10-CM | POA: Diagnosis not present

## 2022-10-29 DIAGNOSIS — M50322 Other cervical disc degeneration at C5-C6 level: Secondary | ICD-10-CM | POA: Diagnosis not present

## 2022-10-29 DIAGNOSIS — G969 Disorder of central nervous system, unspecified: Secondary | ICD-10-CM | POA: Diagnosis not present

## 2022-10-29 LAB — CBC WITH DIFFERENTIAL/PLATELET
Abs Immature Granulocytes: 0.12 10*3/uL — ABNORMAL HIGH (ref 0.00–0.07)
Basophils Absolute: 0 10*3/uL (ref 0.0–0.1)
Basophils Relative: 1 %
Eosinophils Absolute: 0 10*3/uL (ref 0.0–0.5)
Eosinophils Relative: 0 %
HCT: 40.8 % (ref 36.0–46.0)
Hemoglobin: 14.3 g/dL (ref 12.0–15.0)
Immature Granulocytes: 2 %
Lymphocytes Relative: 13 %
Lymphs Abs: 0.7 10*3/uL (ref 0.7–4.0)
MCH: 32.1 pg (ref 26.0–34.0)
MCHC: 35 g/dL (ref 30.0–36.0)
MCV: 91.7 fL (ref 80.0–100.0)
Monocytes Absolute: 0.7 10*3/uL (ref 0.1–1.0)
Monocytes Relative: 12 %
Neutro Abs: 4.1 10*3/uL (ref 1.7–7.7)
Neutrophils Relative %: 72 %
Platelets: 180 10*3/uL (ref 150–400)
RBC: 4.45 MIL/uL (ref 3.87–5.11)
RDW: 12 % (ref 11.5–15.5)
WBC: 5.7 10*3/uL (ref 4.0–10.5)
nRBC: 0 % (ref 0.0–0.2)

## 2022-10-29 LAB — COMPREHENSIVE METABOLIC PANEL
ALT: 32 U/L (ref 0–44)
AST: 19 U/L (ref 15–41)
Albumin: 4.3 g/dL (ref 3.5–5.0)
Alkaline Phosphatase: 53 U/L (ref 38–126)
Anion gap: 9 (ref 5–15)
BUN: 22 mg/dL (ref 8–23)
CO2: 25 mmol/L (ref 22–32)
Calcium: 9.5 mg/dL (ref 8.9–10.3)
Chloride: 95 mmol/L — ABNORMAL LOW (ref 98–111)
Creatinine, Ser: 0.82 mg/dL (ref 0.44–1.00)
GFR, Estimated: >60 mL/min (ref >60)
Glucose, Bld: 96 mg/dL (ref 70–99)
Potassium: 3.8 mmol/L (ref 3.5–5.1)
Sodium: 129 mmol/L — ABNORMAL LOW (ref 135–145)
Total Bilirubin: 0.9 mg/dL (ref 0.3–1.2)
Total Protein: 6.9 g/dL (ref 6.5–8.1)

## 2022-10-29 LAB — CBG MONITORING, ED: Glucose-Capillary: 103 mg/dL — ABNORMAL HIGH (ref 70–99)

## 2022-10-29 MED ORDER — SODIUM CHLORIDE 0.9 % IV SOLN
12.5000 mg | Freq: Once | INTRAVENOUS | Status: AC
  Administered 2022-10-29: 12.5 mg via INTRAVENOUS
  Filled 2022-10-29: qty 12.5

## 2022-10-29 MED ORDER — DEXAMETHASONE SODIUM PHOSPHATE 4 MG/ML IJ SOLN
4.0000 mg | Freq: Once | INTRAMUSCULAR | Status: AC
  Administered 2022-10-30: 4 mg via INTRAVENOUS
  Filled 2022-10-29: qty 1

## 2022-10-29 NOTE — Assessment & Plan Note (Signed)
Monitor Sodium Avoid over aggressive fluid resuscitation  Order urine lytes

## 2022-10-29 NOTE — Assessment & Plan Note (Signed)
Appreciate oncology consult

## 2022-10-29 NOTE — ED Notes (Signed)
Patient tolerating PO challenge.

## 2022-10-29 NOTE — ED Notes (Signed)
Unable to complete orthostatic vital. PT unstable while standing.

## 2022-10-29 NOTE — Assessment & Plan Note (Signed)
In the setting of recurrent CNS lymphoma Oncology consult PT OT eval

## 2022-10-29 NOTE — Subjective & Objective (Signed)
Patient came from independent living after unwitnessed fall she felt unsteady did not hit her head she has known history of brain tumor and have had relapse Known history of B-cell lymphoma Presents with instability nausea vomiting getting worse she walks with a walker yesterday had MRI done new lesions throughout her brain noted as well as cerebellar lesions

## 2022-10-29 NOTE — H&P (Signed)
Ariel [name] GMW:[id number] DOB: September 19, 1955 DOA: 10/29/2022     PCP: Sandford Craze, NP   Outpatient Specialists: * NONE CARDS: * Dr. None  NEphrology: *  Dr. No care team member to display  NEurology *   Dr. Pulmonary *  Dr.  Oncology  Dr.Ennever, Rose Phi, MD  GI* Dr.  Deboraha Sprang, LB) No care team member to display Urology Dr. *  Patient arrived to ER on 10/29/22 at 1933 Referred by Attending Durwin Glaze, MD   Patient coming from:    home Lives alone,   *** With family From facility ***    Chief Complaint: *** Chief Complaint  Patient presents with   Fall    HPI: Ariel [name] is a 67 y.o. female with medical history significant of B-cell lymphoma, SIADH    Presented with ataxia nausea vomiting Patient came from independent living after unwitnessed fall she felt unsteady did not hit her head she has known history of brain tumor and have had relapse Known history of B-cell lymphoma Presents with instability nausea vomiting getting worse she walks with a walker yesterday had MRI done new lesions throughout her brain noted as well as cerebellar lesions   Was just recently seen by oncology For that she also may have a UTI and started on Rocephin in office followed by Cipro as an outpatient Also with given a dose of hydrocortisone    Denies significant ETOH intake *** Does not smoke*** but interested in quitting***  Lab Results  Component Value Date   SARSCOV2NAA POSITIVE (A) 04/04/2022   SARSCOV2NAA NEGATIVE 03/17/2022   SARSCOV2NAA POSITIVE (A) 02/21/2022   SARSCOV2NAA NEGATIVE 03/07/2021       Regarding pertinent Chronic problems: ***  ****Hyperlipidemia - *on statins {statin:315258}  Lipid Panel     Component Value Date/Time   CHOL 134 10/07/2020 0332   TRIG 36 10/07/2020 0332   HDL 44 10/07/2020 0332   CHOLHDL 3.0 10/07/2020 0332   VLDL 7 10/07/2020 0332   LDLCALC 83 10/07/2020 0332     chronic CHF diastolic/systolic/ combined - last  echo*** Recent Results (from the past 44034 hour(s))  ECHOCARDIOGRAM COMPLETE   Collection Time: 10/07/20 11:44 AM  Result Value   Weight 2,259.2   Height 65.984   BP 131/62   Single Plane A2C EF 60.7   Single Plane A4C EF 60.6   Calc EF 61.4   S' Lateral 3.00   Area-P 1/2 2.85   Narrative      ECHOCARDIOGRAM REPORT       IMPRESSIONS    1. Left ventricular ejection fraction, by estimation, is 60 to 65%. The left ventricle has normal function. The left ventricle has no regional wall motion abnormalities. Left ventricular diastolic parameters are consistent with Grade I diastolic  dysfunction (impaired relaxation).  2. Right ventricular systolic function is normal. The right ventricular size is normal. There is normal pulmonary artery systolic pressure.  3. The mitral valve is grossly normal. Trivial mitral valve regurgitation.  4. The aortic valve is tricuspid. Aortic valve regurgitation is not visualized.  5. The inferior vena cava is normal in size with greater than 50% respiratory variability, suggesting right atrial pressure of 3 mmHg.          Cancer: B-cell lymphoma with newly diagnosed recurrence to the brain     While in ER:         Lab Orders         CBC with Differential  Comprehensive metabolic panel         CBG monitoring, ED      CT HEAD *** NON acute   MRI brain  ***no acute CVA  CXR - ***NON acute  CTabd/pelvis - ***nonacute  CTA chest - ***nonacute, no PE, * no evidence of infiltrate  Following Medications were ordered in ER: Medications  promethazine (PHENERGAN) 12.5 mg in sodium chloride 0.9 % 50 mL IVPB (0 mg Intravenous Stopped 10/29/22 2104)    _______________________________________________________ ER Provider Called:       DrMarland Kitchen  They Recommend admit to medicine *** Will see in AM  ***SEEN in ER     ED Triage Vitals  Encounter Vitals Group     BP 10/29/22 1941 134/64     Systolic BP Percentile --      Diastolic BP  Percentile --      Pulse Rate 10/29/22 1941 65     Resp 10/29/22 1941 12     Temp 10/29/22 1941 98.6 F (37 C)     Temp Source 10/29/22 1941 Oral     SpO2 10/29/22 1941 100 %     Weight 10/29/22 2011 151 lb (68.5 kg)     Height 10/29/22 2011 5\' 7"  (1.702 m)     Head Circumference --      Peak Flow --      Pain Score 10/29/22 2011 0     Pain Loc --      Pain Education --      Exclude from Growth Chart --   ZOXW(96)@     _________________________________________ Significant initial  Findings: Abnormal Labs Reviewed  CBC WITH DIFFERENTIAL/PLATELET - Abnormal; Notable for the following components:      Result Value   Abs Immature Granulocytes 0.12 (*)    All other components within normal limits  COMPREHENSIVE METABOLIC PANEL - Abnormal; Notable for the following components:   Sodium 129 (*)    Chloride 95 (*)    All other components within normal limits  CBG MONITORING, ED - Abnormal; Notable for the following components:   Glucose-Capillary 103 (*)    All other components within normal limits    _________________________ Troponin ***ordered Cardiac Panel (last 3 results) No results for input(s): "CKTOTAL", "CKMB", "TROPONINIHS", "RELINDX" in the last 72 hours.   ECG: Ordered Personally reviewed and interpreted by me showing: HR : *** Rhythm: *NSR, Sinus tachycardia * A.fib. W RVR, RBBB, LBBB, Paced Ischemic changes*nonspecific changes, no evidence of ischemic changes QTC*  BNP (last 3 results) Recent Labs    04/07/22 0535  BNP 69.0     COVID-19 Labs  No results for input(s): "DDIMER", "FERRITIN", "LDH", "CRP" in the last 72 hours.  Lab Results  Component Value Date   SARSCOV2NAA POSITIVE (A) 04/04/2022   SARSCOV2NAA NEGATIVE 03/17/2022   SARSCOV2NAA POSITIVE (A) 02/21/2022   SARSCOV2NAA NEGATIVE 03/07/2021     The recent clinical data is shown below. Vitals:   10/29/22 1941 10/29/22 2011 10/29/22 2100  BP: 134/64  123/62  Pulse: 65  60  Resp: 12  17   Temp: 98.6 F (37 C)    TempSrc: Oral    SpO2: 100%  99%  Weight:  68.5 kg   Height:  5\' 7"  (1.702 m)     WBC     Component Value Date/Time   WBC 5.7 10/29/2022 2022   LYMPHSABS 0.7 10/29/2022 2022   MONOABS 0.7 10/29/2022 2022   EOSABS 0.0 10/29/2022 2022   BASOSABS 0.0 10/29/2022  2022      UA  ordered     Results for orders placed or performed in visit on 10/26/22  Culture, Urine     Status: Abnormal   Collection Time: 10/26/22 12:56 PM   Specimen: Urine, Clean Catch  Result Value Ref Range Status   Specimen Description   Final    URINE, CLEAN CATCH Performed at [facility] Lab at [facility], [address] [place], [place] [address]    Special Requests   Final    URINE, CLEAN CATCH Performed at [facility] at [facility], [address], [place], [place] [address]    Culture >=100,000 COLONIES/mL ESCHERICHIA COLI (A)  Final   Report Status 10/28/2022 FINAL  Final   Organism ID, Bacteria ESCHERICHIA COLI (A)  Final      Susceptibility   Escherichia coli - MIC*    AMPICILLIN >=32 RESISTANT Resistant     CEFAZOLIN <=4 SENSITIVE Sensitive     CEFEPIME <=0.12 SENSITIVE Sensitive     CEFTRIAXONE <=0.25 SENSITIVE Sensitive     CIPROFLOXACIN <=0.25 SENSITIVE Sensitive     GENTAMICIN >=16 RESISTANT Resistant     IMIPENEM <=0.25 SENSITIVE Sensitive     NITROFURANTOIN <=16 SENSITIVE Sensitive     TRIMETH/SULFA >=320 RESISTANT Resistant     AMPICILLIN/SULBACTAM 16 INTERMEDIATE Intermediate     PIP/TAZO <=4 SENSITIVE Sensitive     * >=100,000 COLONIES/mL ESCHERICHIA COLI    ABX started Antibiotics Given (last 72 hours)     None       Susceptibility data from last 90 days. Collected Specimen Info Organism AMPICILLIN AMPICILLIN/SULBACTAM CEFAZOLIN CEFEPIME CEFTRIAXONE Ciprofloxacin Gentamicin Susc lslt Imipenem Nitrofurantoin Susc lslt Piperacillin + Tazobactam Trimethoprim/Sulfa  10/26/22 Urine, Clean Catch  Escherichia coli  R  I  S  S  S  S  R  S  S  S  R    __________________________________________________________ Recent Labs  Lab 10/26/22 1154 10/29/22 2022  NA 131* 129*  K 3.8 3.8  CO2 29 25  GLUCOSE 119* 96  BUN 22 22  CREATININE 0.66 0.82  CALCIUM 9.9 9.5    Cr stable,    Lab Results  Component Value Date   CREATININE 0.82 10/29/2022   CREATININE 0.66 10/26/2022   CREATININE 0.81 10/16/2022    Recent Labs  Lab 10/26/22 1154 10/29/22 2022  AST 16 19  ALT 22 32  ALKPHOS 51 53  BILITOT 0.5 0.9  PROT 6.8 6.9  ALBUMIN 4.6 4.3   Lab Results  Component Value Date   CALCIUM 9.5 10/29/2022   PHOS 3.5 04/14/2022    Plt: Lab Results  Component Value Date   PLT 180 10/29/2022    Recent Labs  Lab 10/26/22 1154 10/29/22 2022  WBC 5.8 5.7  NEUTROABS 3.5 4.1  HGB 13.8 14.3  HCT 39.7 40.8  MCV 91.5 91.7  PLT 175 180    HG/HCT  stable     Component Value Date/Time   HGB 14.3 10/29/2022 2022   HGB 13.8 10/26/2022 1154   HCT 40.8 10/29/2022 2022   MCV 91.7 10/29/2022 2022    ____________________ Hospitalist was called for admission for   CNS lymphoma  Ataxia, Nausea and vomiting    The following Work up has been ordered so far:  Orders Placed This Encounter  Procedures   CBC with Differential   Comprehensive metabolic panel   Consult to hospitalist   CBG monitoring, ED  Insert peripheral IV     OTHER Significant initial  Findings:  labs showing:     DM  labs:  HbA1C: No results for input(s): "HGBA1C" in the last 8760 hours.     CBG (last 3)  Recent Labs    10/29/22 1938  GLUCAP 103*          Cultures:    Component Value Date/Time   SDES  10/26/2022 1256    URINE, CLEAN CATCH Performed at [facility] Lab at [facility], [address] [place], [place] [address]    [facility]  10/26/2022 1256    URINE, CLEAN CATCH Performed at [facility] Lab at [facility], [address], [place], [place] [address]    CULT >=100,000 COLONIES/mL ESCHERICHIA COLI (A) 10/26/2022 1256   REPTSTATUS 10/28/2022 FINAL 10/26/2022 1256     Radiological Exams on Admission: No results found. _______________________________________________________________________________________________________ Latest  Blood pressure 123/62, pulse 60, temperature 98.6 F (37 C), temperature source Oral, resp. rate 17, height 5\' 7"  (1.702 m), weight 68.5 kg, SpO2 99%.   Vitals  labs and radiology finding personally reviewed  Review of Systems:    Pertinent positives include: ***  Constitutional:  No weight loss, night sweats, Fevers, chills, fatigue, weight loss  HEENT:  No headaches, Difficulty swallowing,Tooth/dental problems,Sore throat,  No sneezing, itching, ear ache, nasal congestion, post nasal drip,  Cardio-vascular:  No chest pain, Orthopnea, PND, anasarca, dizziness, palpitations.no Bilateral lower extremity swelling  GI:  No heartburn, indigestion, abdominal pain, nausea, vomiting, diarrhea, change in bowel habits, loss of appetite, melena, blood in stool, hematemesis Resp:  no shortness of breath at rest. No dyspnea on exertion, No excess mucus, no productive cough, No non-productive cough, No coughing up of blood.No change in color of mucus.No wheezing. Skin:  no rash or lesions. No jaundice GU:  no dysuria, change in color of urine, no urgency or frequency. No straining to urinate.  No flank pain.  Musculoskeletal:  No joint pain or no joint swelling. No decreased range of motion. No back pain.  Psych:  No change in mood or affect. No depression or anxiety. No memory loss.  Neuro: no localizing neurological complaints, no tingling, no weakness, no double vision, no gait abnormality, no slurred speech, no confusion  All systems reviewed and apart from HOPI all are negative _______________________________________________________________________________________________ Past Medical  History:   Past Medical History:  Diagnosis Date   Allergy    Arthritis    Cataract    Chicken pox    Complication of anesthesia    Per patient, very slow to wake up after anesthesia   Goals of care, counseling/discussion 12/22/2020   High cholesterol    High grade B-cell lymphoma (HCC) 12/22/2020   Hyperglycemia 11/10/2020   Melanoma (HCC) 2021   right hip   Melanoma (HCC) 10/28/2020   pt stated brain liver and bladder   PONV (postoperative nausea and vomiting)    Stroke [facility])    Urinary incontinence       Past Surgical History:  Procedure Laterality Date   AIR/FLUID EXCHANGE Right 12/07/2020   Procedure: AIR/FLUID EXCHANGE;  Surgeon: Carmela Rima, MD;  Location: [facility] OR;  Service: Ophthalmology;  Laterality: Right;   APPENDECTOMY  1962   BIOPSY  12/14/2020   Procedure: BIOPSY;  Surgeon: Meridee Score Netty Starring., MD;  Location: [facility] ENDOSCOPY;  Service: Gastroenterology;;   ESOPHAGOGASTRODUODENOSCOPY (EGD) WITH PROPOFOL N/A 12/14/2020   Procedure: ESOPHAGOGASTRODUODENOSCOPY (EGD) WITH PROPOFOL;  Surgeon: Lemar Lofty., MD;  Location: [facility] ENDOSCOPY;  Service: Gastroenterology;  Laterality: N/A;   EXCISION MELANOMA WITH SENTINEL LYMPH NODE BIOPSY Right 10/09/2019   Procedure: WIDE LOCAL EXCISION WITH ADVANCEMENT FLAP CLOSURE RIGHT HIP MELANOMA WITH SENTINEL LYMPH NODE BIOPSY;  Surgeon: Almond Lint, MD;  Location: [facility] OR;  Service: General;  Laterality: Right;   EYE SURGERY     FINE NEEDLE ASPIRATION  12/14/2020   Procedure: FINE NEEDLE ASPIRATION (FNA) LINEAR;  Surgeon: Lemar Lofty., MD;  Location[facility] ENDOSCOPY;  Service: Gastroenterology;;   HERNIA REPAIR  2009   IR BONE MARROW BIOPSY & ASPIRATION  05/08/2022   IR IMAGING GUIDED PORT INSERTION  12/30/2020   JOINT REPLACEMENT  2023   MOUTH SURGERY  11/2015   gum surgery and bone implant   PARS PLANA VITRECTOMY Right 12/07/2020   Procedure: PARS PLANA VITRECTOMY WITH 25 GAUGE;  Surgeon: Carmela Rima,  MD;  Location: [facility] OR;  Service: Ophthalmology;  Laterality: Right;   PHOTOCOAGULATION WITH LASER Right 12/07/2020   Procedure: PHOTOCOAGULATION WITH ENDOLASER PANRITINAL COAGULATION;  Surgeon: Carmela Rima, MD;  Location: [facility] OR;  Service: Ophthalmology;  Laterality: Right;   TOTAL HIP ARTHROPLASTY Left 10/11/2021   Procedure: LEFT TOTAL HIP ARTHROPLASTY ANTERIOR APPROACH;  Surgeon: Marcene Corning, MD;  Location: [facility] ORS;  Service: Orthopedics;  Laterality: Left;   TOTAL HIP ARTHROPLASTY Right 01/10/2022   Procedure: RIGHT TOTAL HIP ARTHROPLASTY ANTERIOR APPROACH;  Surgeon: Marcene Corning, MD;  Location: [facility] ORS;  Service: Orthopedics;  Laterality: Right;   UPPER ESOPHAGEAL ENDOSCOPIC ULTRASOUND (EUS) N/A 12/14/2020   Procedure: UPPER ESOPHAGEAL ENDOSCOPIC ULTRASOUND (EUS);  Surgeon: Lemar Lofty., MD;  Location: [facility] ENDOSCOPY;  Service: Gastroenterology;  Laterality: N/A;   WISDOM TOOTH EXTRACTION      Social History:  Ambulatory *** independently cane, walker  wheelchair bound, bed bound     reports that she has never smoked. She has never used smokeless tobacco. She reports that she does not drink alcohol and does not use drugs.     Family History: *** Family History  Problem Relation Age of Onset   Coronary artery disease Father        died at 19   COPD Father    Alcohol abuse Father    Diabetes Mellitus II Father    Coronary artery disease Sister    Cancer Sister        breast   Heart attack Brother    Hyperlipidemia Other        died 96- "natural causes"   Hyperlipidemia Other        4 siblings    Hypertension Other        3 siblings   ______________________________________________________________________________________________ Allergies: Allergies  Allergen Reactions   Decadron [Dexamethasone] Other (See Comments)    Leg weakness Hallucinations   Plavix [Clopidogrel] Hives, Swelling and Other (See Comments)    Lip swelling    Prednisone Other (See  Comments)    Steroids caused psychological issues  Hallucinations   Sulfa Antibiotics Itching and Rash   Codeine Nausea And Vomiting     Prior to Admission medications   Medication Sig Start Date End Date Taking? Authorizing Provider  acetaminophen (TYLENOL) 325 MG tablet Take 1,300 mg by mouth daily as needed for headache, fever, moderate pain or mild pain.    [provider]  ciprofloxacin (CIPRO) 500 MG tablet Take 1 tablet (500 mg total) by mouth 2 (two) times daily. 10/26/22   [name], MD  ibrutinib (IMBRUVICA) 420 MG tablet Take 2 tablets (840 mg total) by mouth daily. Take with a glass of water. Patient not taking: Reported on 10/26/2022 07/11/22   [name], MD  lidocaine-prilocaine (EMLA) cream Apply to affected area once 05/03/22   [name], MD  naphazoline-glycerin (CLEAR EYES REDNESS) 0.012-0.25 % SOLN Place 2 drops into both eyes 4 (four) times daily as needed for eye irritation. 04/14/22   Drema Dallas, MD  ondansetron (ZOFRAN-ODT) 8 MG disintegrating tablet Take 1 tablet (8 mg total) by mouth every 8 (eight) hours as needed for nausea or vomiting. 10/26/22   [name], MD    ___________________________________________________________________________________________________ Physical Exam:    10/29/2022    9:00 PM 10/29/2022    8:11 PM 10/29/2022    7:41 PM  Vitals with BMI  Height  5\' 7"    Weight  151 lbs   BMI  23.64   Systolic 123  134  Diastolic 62  64  Pulse 60  65     1. General:  in No  Acute distresds    Chronically ill  -appearing 2. Psychological: Alert and   Oriented 3. Head/ENT:   Dry Mucous Membranes                          Head Non traumatic, neck supple                            Poor Dentition 4. SKIN:  decreased Skin turgor,  Skin clean Dry and intact no rash    5. Heart: Regular rate and rhythm no*** Murmur, no Rub or gallop 6. Lungs: ***Clear to auscultation bilaterally, no wheezes or crackles   7. Abdomen:  Soft, ***non-tender, Non distended *** obese ***bowel sounds present 8. Lower extremities: no clubbing, cyanosis, no ***edema 9. Neurologically Grossly intact, moving all 4 extremities equally *** strength 5 out of 5 in all 4 extremities cranial nerves II through XII intact 10. MSK: Normal range of motion    Chart has been reviewed  ______________________________________________________________________________________________  Assessment/Plan 67 y.o. female with medical history significant of B-cell lymphoma, SIADH  Admitted for  CNS lymphoma  Ataxia, Nausea and vomiting   Present on Admission: **None**    No problem-specific Assessment & Plan notes found for this encounter.    Other plan as per orders.  DVT prophylaxis:  SCD *** Lovenox       Code Status:    Code Status: Prior FULL CODE *** DNR/DNI ***comfort care as per patient ***family  I had personally discussed CODE STATUS with patient and family*  ACP *** none has been reviewed ***   Family Communication:   Family not at  Bedside  plan of care was discussed on the phone with *** Son, Daughter, Wife, Husband, Sister, Brother , father, mother  Diet    Disposition Plan:   *** likely will need placement for rehabilitation                          Back to current facility when stable                            To home once workup is complete and patient is stable  ***Following barriers for discharge:  Chest pain *** Stroke *** work up is complete                            Electrolytes corrected                               Anemia corrected h/H stable                             Pain controlled with PO medications                               Afebrile, white count improving able to transition to PO antibiotics                             Will need to be able to tolerate PO                            Will likely need home health, home O2, set up                           Will need  consultants to evaluate patient prior to discharge       Consult Orders  (From admission, onward)           Start     Ordered   10/29/22 2205  Consult to hospitalist  Once       Provider:  (Not yet assigned)  Question Answer Comment  Place call to: [facility]   Reason for Consult Admit      10/29/22 2204                              ***Would benefit from PT/OT eval prior to DC  Ordered                   Swallow eval - SLP ordered                   Diabetes care coordinator                   Transition of care consulted                   Nutrition    consulted                  Wound care  consulted                   Palliative care    consulted                   Behavioral health  consulted                    Consults called: ***     Admission status:  ED Disposition     ED Disposition  Admit   Condition  --   Comment  The patient appears reasonably stabilized for admission considering the current resources, flow, and capabilities available in the ED at this time, and I doubt any other [facility] requiring further screening and/or treatment  in the ED prior to admission is  present.           Obs***  ***  inpatient     I Expect 2 midnight stay secondary to severity of patient's current illness need for inpatient interventions justified by the following: ***hemodynamic instability despite optimal treatment (tachycardia *hypotension * tachypnea *hypoxia, hypercapnia) * Severe lab/radiological/exam abnormalities including:     and extensive comorbidities including: *substance abuse  *Chronic pain *DM2  * CHF * CAD  * COPD/asthma *Morbid Obesity * CKD *dementia *liver disease *history of stroke with residual deficits *  malignancy, * sickle cell disease  History of amputation Chronic anticoagulation  That are currently affecting medical management.   I expect  patient to be hospitalized for 2 midnights requiring inpatient medical  care.  Patient is at high risk for adverse outcome (such as loss of life or disability) if not treated.  Indication for inpatient stay as follows:  Severe change from baseline regarding mental status Hemodynamic instability despite maximal medical therapy,  ongoing suicidal ideations,  severe pain requiring acute inpatient management,  inability to maintain oral hydration   persistent chest pain despite medical management Need for operative/procedural  intervention New or worsening hypoxia   Need for IV antibiotics, IV fluids, IV rate controling medications, IV antihypertensives, IV pain medications, IV anticoagulation, need for biPAP    Level of care   *** tele  For 12H 24H     medical floor       progressive     stepdown   tele indefinitely please discontinue once patient no longer qualifies COVID-19 Labs    Lab Results  Component Value Date   SARSCOV2NAA POSITIVE (A) 04/04/2022     Precautions: admitted as *** Covid Negative  ***asymptomatic screening protocol****PUI *** covid positive No active isolations ***If Covid PCR is negative  - please DC precautions - would need additional investigation given very high risk for false native test result       Decari Duggar 10/29/2022, 10:29 PM ***  [facility]     after 2 AM please page floor coverage PA If 7AM-7PM, please contact the day team taking care of the patient using [email or web address]

## 2022-10-29 NOTE — Assessment & Plan Note (Addendum)
Appreciate oncology consult to see what is the plan for this pt Discussed case with Dr. Mosetta Putt Pt will be admitted to [facility]  She is agreeable to Decadron at reduced dose  Will give Decadrone 4 mg once Has had issues with hallucinations in the past Please notify Dr. Barbaraann Cao in AM

## 2022-10-29 NOTE — ED Triage Notes (Signed)
Patient arrived from independent living facility after an unwitnessed fall.  Patient said she felt dizzy and fell but denies hitting anything as she fell.  Patient was recently diagnosed with a relapse of a brain tumor.

## 2022-10-29 NOTE — ED Provider Notes (Signed)
[place] EMERGENCY DEPARTMENT AT [facility] Provider Note   CSN: [id number] Arrival date & time: 10/29/22  1933     History  Chief Complaint  Patient presents with   Ariel [name]    Ariel [name] is a 67 y.o. female.  67 year old female with past medical history of B-cell lymphoma presenting to the emergency department today with gait instability, nausea, and vomiting.  The patient has been worked up as an outpatient for some of the symptoms.  They are progressively getting worse.  The patient states that she lives at home alone and uses a walker to get around at baseline.  She did have an MRI performed yesterday.  I have reviewed this and does appear that she does have new lesions throughout her brain and also in her cerebellum.  The patient states that today that she was unable to ambulate due to this and being very unsteady on her feet.  Patient did have a fall earlier due to this but did not hit her head and did not injure anything.  She was unable to walk after that she was brought to the emergency department for further evaluation.  The history is provided by the patient.  Fall       Home Medications Prior to Admission medications   Medication Sig Start Date End Date Taking? Authorizing Provider  acetaminophen (TYLENOL) 325 MG tablet Take 1,300 mg by mouth daily as needed for headache, fever, moderate pain or mild pain.    [provider]  ciprofloxacin (CIPRO) 500 MG tablet Take 1 tablet (500 mg total) by mouth 2 (two) times daily. 10/26/22   [name], MD  ibrutinib (IMBRUVICA) 420 MG tablet Take 2 tablets (840 mg total) by mouth daily. Take with a glass of water. Patient not taking: Reported on 10/26/2022 07/11/22   [name], MD  lidocaine-prilocaine (EMLA) cream Apply to affected area once 05/03/22   [name], MD  naphazoline-glycerin (CLEAR EYES REDNESS) 0.012-0.25 % SOLN Place 2 drops into both eyes 4 (four) times daily as needed for eye  irritation. 04/14/22   Drema Dallas, MD  ondansetron (ZOFRAN-ODT) 8 MG disintegrating tablet Take 1 tablet (8 mg total) by mouth every 8 (eight) hours as needed for nausea or vomiting. 10/26/22   [name], MD      Allergies    Decadron [dexamethasone], Plavix [clopidogrel], Prednisone, Sulfa antibiotics, and Codeine    Review of Systems   Review of Systems  Gastrointestinal:  Positive for nausea and vomiting.  Neurological:  Positive for dizziness.  All other systems reviewed and are negative.   Physical Exam Updated Vital Signs BP 123/62   Pulse 60   Temp 98.6 F (37 C) (Oral)   Resp 17   Ht 5\' 7"  (1.702 m)   Wt 68.5 kg   SpO2 99%   BMI 23.65 kg/m  Physical Exam Vitals and nursing note reviewed.   Gen: NAD Eyes: PERRL, EOMI HEENT: no oropharyngeal swelling Neck: trachea midline Resp: clear to auscultation bilaterally Card: RRR, no murmurs, rubs, or gallops Abd: nontender, nondistended Extremities: no calf tenderness, no edema Vascular: 2+ radial pulses bilaterally, 2+ DP pulses bilaterally Neuro: Patient has equal strength sensation bilateral upper and lower extremities no dysmetria on finger-nose testing, no obvious cranial nerve deficits noted.  The patient is very unsteady on her feet and requires significant assistance to go from stretcher to the bed. Skin: no rashes Psyc: acting appropriately   ED Results /  Procedures / Treatments   Labs (all labs ordered are listed, but only abnormal results are displayed) Labs Reviewed  CBC WITH DIFFERENTIAL/PLATELET - Abnormal; Notable for the following components:      Result Value   Abs Immature Granulocytes 0.12 (*)    All other components within normal limits  COMPREHENSIVE METABOLIC PANEL - Abnormal; Notable for the following components:   Sodium 129 (*)    Chloride 95 (*)    All other components within normal limits  CBG MONITORING, ED - Abnormal; Notable for the following components:   Glucose-Capillary  103 (*)    All other components within normal limits    EKG None  Radiology No results found.  Procedures Procedures    Medications Ordered in ED Medications  promethazine (PHENERGAN) 12.5 mg in sodium chloride 0.9 % 50 mL IVPB (0 mg Intravenous Stopped 10/29/22 2104)    ED Course/ Medical Decision Making/ A&P                                 Medical Decision Making 68 year old female with past medical history of B-cell lymphoma presenting to the emergency department today with gait instability and vomiting.  I have reviewed her MRI from yesterday and she does have lesions in her cerebellum..  This is clearly a cause of all of her symptoms here.  I will give patient Phenergan here for her nausea as well as IV fluids and obtain basic labs.  She will likely require admission for further evaluation as she is not safe at home in her current state.  Amount and/or Complexity of Data Reviewed Labs: ordered.          Final Clinical Impression(s) / ED Diagnoses Final diagnoses:  CNS lymphoma (HCC)  Gait instability  Nausea and vomiting, unspecified vomiting type  Dispo: admit  Rx / DC Orders ED Discharge Orders     None         Durwin Glaze, MD 10/29/22 2216

## 2022-10-29 NOTE — Assessment & Plan Note (Signed)
Nutritional consult 

## 2022-10-30 ENCOUNTER — Other Ambulatory Visit: Payer: Self-pay

## 2022-10-30 ENCOUNTER — Inpatient Hospital Stay (HOSPITAL_COMMUNITY): Payer: Medicare Other

## 2022-10-30 DIAGNOSIS — N390 Urinary tract infection, site not specified: Secondary | ICD-10-CM | POA: Diagnosis present

## 2022-10-30 DIAGNOSIS — C7931 Secondary malignant neoplasm of brain: Secondary | ICD-10-CM | POA: Diagnosis not present

## 2022-10-30 DIAGNOSIS — N3000 Acute cystitis without hematuria: Secondary | ICD-10-CM | POA: Diagnosis not present

## 2022-10-30 DIAGNOSIS — Z79899 Other long term (current) drug therapy: Secondary | ICD-10-CM

## 2022-10-30 DIAGNOSIS — C8589 Other specified types of non-Hodgkin lymphoma, extranodal and solid organ sites: Secondary | ICD-10-CM | POA: Diagnosis not present

## 2022-10-30 DIAGNOSIS — C7951 Secondary malignant neoplasm of bone: Secondary | ICD-10-CM

## 2022-10-30 DIAGNOSIS — C8510 Unspecified B-cell lymphoma, unspecified site: Secondary | ICD-10-CM | POA: Diagnosis not present

## 2022-10-30 LAB — COMPREHENSIVE METABOLIC PANEL
ALT: 33 U/L (ref 0–44)
AST: 20 U/L (ref 15–41)
Albumin: 4.1 g/dL (ref 3.5–5.0)
Alkaline Phosphatase: 50 U/L (ref 38–126)
Anion gap: 10 (ref 5–15)
BUN: 21 mg/dL (ref 8–23)
CO2: 23 mmol/L (ref 22–32)
Calcium: 9.3 mg/dL (ref 8.9–10.3)
Chloride: 97 mmol/L — ABNORMAL LOW (ref 98–111)
Creatinine, Ser: 0.73 mg/dL (ref 0.44–1.00)
GFR, Estimated: >60 mL/min (ref >60)
Glucose, Bld: 119 mg/dL — ABNORMAL HIGH (ref 70–99)
Potassium: 3.9 mmol/L (ref 3.5–5.1)
Sodium: 130 mmol/L — ABNORMAL LOW (ref 135–145)
Total Bilirubin: 0.6 mg/dL (ref 0.3–1.2)
Total Protein: 6.8 g/dL (ref 6.5–8.1)

## 2022-10-30 LAB — CBC
HCT: 42.4 % (ref 36.0–46.0)
Hemoglobin: 14.7 g/dL (ref 12.0–15.0)
MCH: 32.3 pg (ref 26.0–34.0)
MCHC: 34.7 g/dL (ref 30.0–36.0)
MCV: 93.2 fL (ref 80.0–100.0)
Platelets: 175 10*3/uL (ref 150–400)
RBC: 4.55 MIL/uL (ref 3.87–5.11)
RDW: 12.1 % (ref 11.5–15.5)
WBC: 8.6 10*3/uL (ref 4.0–10.5)
nRBC: 0 % (ref 0.0–0.2)

## 2022-10-30 LAB — PROCALCITONIN: Procalcitonin: <0.1 ng/mL

## 2022-10-30 LAB — BASIC METABOLIC PANEL
Anion gap: 11 (ref 5–15)
BUN: 21 mg/dL (ref 8–23)
CO2: 22 mmol/L (ref 22–32)
Calcium: 9.4 mg/dL (ref 8.9–10.3)
Chloride: 100 mmol/L (ref 98–111)
Creatinine, Ser: 0.73 mg/dL (ref 0.44–1.00)
GFR, Estimated: >60 mL/min (ref >60)
Glucose, Bld: 197 mg/dL — ABNORMAL HIGH (ref 70–99)
Potassium: 3.7 mmol/L (ref 3.5–5.1)
Sodium: 133 mmol/L — ABNORMAL LOW (ref 135–145)

## 2022-10-30 LAB — URINALYSIS, COMPLETE (UACMP) WITH MICROSCOPIC
Bacteria, UA: NONE SEEN
Bilirubin Urine: NEGATIVE
Glucose, UA: NEGATIVE mg/dL
Ketones, ur: 20 mg/dL — AB
Leukocytes,Ua: NEGATIVE
Nitrite: NEGATIVE
Protein, ur: NEGATIVE mg/dL
Specific Gravity, Urine: 1.015 (ref 1.005–1.030)
pH: 6 (ref 5.0–8.0)

## 2022-10-30 LAB — PHOSPHORUS
Phosphorus: 3.1 mg/dL (ref 2.5–4.6)
Phosphorus: 3.5 mg/dL (ref 2.5–4.6)

## 2022-10-30 LAB — MAGNESIUM
Magnesium: 2.3 mg/dL (ref 1.7–2.4)
Magnesium: 2.4 mg/dL (ref 1.7–2.4)

## 2022-10-30 LAB — HEPATIC FUNCTION PANEL
ALT: 28 U/L (ref 0–44)
AST: 18 U/L (ref 15–41)
Albumin: 4 g/dL (ref 3.5–5.0)
Alkaline Phosphatase: 48 U/L (ref 38–126)
Bilirubin, Direct: 0.1 mg/dL (ref 0.0–0.2)
Indirect Bilirubin: 0.5 mg/dL (ref 0.3–0.9)
Total Bilirubin: 0.6 mg/dL (ref 0.3–1.2)
Total Protein: 6.6 g/dL (ref 6.5–8.1)

## 2022-10-30 LAB — CREATININE, URINE, RANDOM: Creatinine, Urine: 119 mg/dL

## 2022-10-30 LAB — CK: Total CK: 41 U/L (ref 38–234)

## 2022-10-30 LAB — SODIUM, URINE, RANDOM: Sodium, Ur: 50 mmol/L

## 2022-10-30 LAB — OSMOLALITY, URINE: Osmolality, Ur: 528 mosm/kg (ref 300–900)

## 2022-10-30 LAB — TSH: TSH: 0.714 u[IU]/mL (ref 0.350–4.500)

## 2022-10-30 LAB — OSMOLALITY: Osmolality: 281 mosm/kg (ref 275–295)

## 2022-10-30 LAB — PREALBUMIN: Prealbumin: 30 mg/dL (ref 18–38)

## 2022-10-30 MED ORDER — ONDANSETRON HCL 4 MG PO TABS: 4.0000 mg | ORAL_TABLET | Freq: Four times a day (QID) | ORAL | Status: DC | PRN

## 2022-10-30 MED ORDER — GADOBUTROL 1 MMOL/ML IV SOLN
6.0000 mL | Freq: Once | INTRAVENOUS | Status: AC | PRN
  Administered 2022-10-30: 6 mL via INTRAVENOUS

## 2022-10-30 MED ORDER — DIAZEPAM 5 MG/ML IJ SOLN: 5.0000 mg | Freq: Once | INTRAMUSCULAR | Status: DC

## 2022-10-30 MED ORDER — SODIUM CHLORIDE 0.9 % IV SOLN
1.0000 g | INTRAVENOUS | Status: DC
  Administered 2022-10-30 – 2022-10-31 (×2): 1 g via INTRAVENOUS
  Filled 2022-10-30 (×2): qty 10

## 2022-10-30 MED ORDER — ACETAMINOPHEN 325 MG PO TABS
650.0000 mg | ORAL_TABLET | Freq: Four times a day (QID) | ORAL | Status: DC | PRN
  Administered 2022-10-30: 650 mg via ORAL
  Filled 2022-10-30: qty 2

## 2022-10-30 MED ORDER — HYDROCODONE-ACETAMINOPHEN 5-325 MG PO TABS: 1.0000 | ORAL_TABLET | ORAL | Status: DC | PRN

## 2022-10-30 MED ORDER — ONDANSETRON HCL 4 MG/2ML IJ SOLN
4.0000 mg | Freq: Four times a day (QID) | INTRAMUSCULAR | Status: DC | PRN
  Administered 2022-11-04: 4 mg via INTRAVENOUS
  Filled 2022-10-30: qty 2

## 2022-10-30 MED ORDER — ACETAMINOPHEN 650 MG RE SUPP: 650.0000 mg | Freq: Four times a day (QID) | RECTAL | Status: DC | PRN

## 2022-10-30 MED ORDER — BOOST / RESOURCE BREEZE PO LIQD CUSTOM
1.0000 | Freq: Three times a day (TID) | ORAL | Status: DC
  Administered 2022-10-30 – 2022-11-02 (×6): 1 via ORAL

## 2022-10-30 MED ORDER — DEXAMETHASONE SODIUM PHOSPHATE 10 MG/ML IJ SOLN
10.0000 mg | INTRAMUSCULAR | Status: DC
  Administered 2022-10-30 – 2022-11-05 (×7): 10 mg via INTRAVENOUS
  Filled 2022-10-30 (×7): qty 1

## 2022-10-30 MED ORDER — SODIUM CHLORIDE 0.9 % IV SOLN: INTRAVENOUS | Status: AC

## 2022-10-30 NOTE — Assessment & Plan Note (Addendum)
-   follows with Dr. Myna Hidalgo -diagnosed with recurrence with CNS involvement.  MRI brain on 10/27/2022 shows recurrent lesions -MRI T-spine also showing signal abnormality concerning for CNS lymphoma as well.  -oncology decided to treat with rituxan, decadron, high dose methotrexate.

## 2022-10-30 NOTE — Assessment & Plan Note (Addendum)
-   due to cerebellar involvement from CNS recurrence

## 2022-10-30 NOTE — Assessment & Plan Note (Signed)
UA grew E.coli  Cont rocephin pt with sig nasuea/vomiting unable to tolerate po

## 2022-10-30 NOTE — Assessment & Plan Note (Addendum)
-   currently Na improved - improved after diet improved.

## 2022-10-30 NOTE — Progress Notes (Signed)
OT Cancellation Note  Patient Details Name: Ariel [name] MRN: [id number] DOB: [birth year]   Cancelled Treatment:    Reason Eval/Treat Not Completed: Patient declined, no reason specified Patient reported being too weak even with education on role of OT in continuum of care. OT to continue to follow Rosalio Loud, MS Acute Rehabilitation Department Office# [phone] 10/30/2022, 11:17 AM

## 2022-10-30 NOTE — Evaluation (Signed)
Physical Therapy Evaluation Patient Details Name: Ariel [name] MRN: [id number] DOB: 1956-01-29 Today's Date: 10/30/2022  History of Present Illness  Presented with ataxia nausea vomiting Patient came from independent living after unwitnessed fall, she has known history of brain tumor and have had relapse, Known history of B-cell lymphoma.  Presents with instability nausea vomiting getting worse. MRI showed new lesions throughout her brain noted as well as cerebellar lesions. Recent Dx of UTI.  Clinical Impression  Pt admitted with above diagnosis. Min assist for sit to stand and to take a few pivotal steps to bedside commode, then to recliner. Pt reported she was too fatigued to ambulate this morning. At baseline she ambulates independently with a walker at an ALF. She's hoping to return to ALF.  Pt currently with functional limitations due to the deficits listed below (see PT Problem List). Pt will benefit from acute skilled PT to increase their independence and safety with mobility to allow discharge.           If plan is discharge home, recommend the following: A little help with walking and/or transfers;A little help with bathing/dressing/bathroom;Assistance with cooking/housework;Assist for transportation;Help with stairs or ramp for entrance   Can travel by private vehicle        Equipment Recommendations None recommended by PT  Recommendations for Other Services       Functional Status Assessment Patient has had a recent decline in their functional status and demonstrates the ability to make significant improvements in function in a reasonable and predictable amount of time.     Precautions / Restrictions Precautions Precautions: Fall Precaution Comments: fell just prior to admission Restrictions Weight Bearing Restrictions: No      Mobility  Bed Mobility Overal bed mobility: Modified Independent             General bed mobility comments: HOB up, used bedrail     Transfers Overall transfer level: Needs assistance Equipment used: 1 person hand held assist Transfers: Sit to/from Stand, Bed to chair/wheelchair/BSC Sit to Stand: Min assist   Step pivot transfers: Min assist       General transfer comment: assist to rise and to steady for step pivot to [facility] then to recliner; activity limited by fatigue, poorly controlled descent to recliner    Ambulation/Gait               General Gait Details: pt too fatigued to attempt ambulation  Stairs            Wheelchair Mobility     Tilt Bed    Modified Rankin (Stroke Patients Only)       Balance Overall balance assessment: Needs assistance, History of Falls Sitting-balance support: Feet supported, No upper extremity supported Sitting balance-Leahy Scale: Good     Standing balance support: Bilateral upper extremity supported, During functional activity, Reliant on assistive device for balance Standing balance-Leahy Scale: Poor                               Pertinent Vitals/Pain Pain Assessment Pain Assessment: Faces Faces Pain Scale: Hurts little more Pain Location: B knees (chronic 2* arthritis per pt) Pain Descriptors / Indicators: Sore Pain Intervention(s): Limited activity within patient's tolerance, Monitored during session, Repositioned    Home Living Family/patient expects to be discharged to:: Assisted living                 Home Equipment: Shower seat;Grab bars - tub/shower;Grab  bars - toilet;BSC/3in1;Toilet riser;Rolling Walker (2 wheels);Cane - single point;Rollator (4 wheels);Wheelchair - manual      Prior Function Prior Level of Function : Independent/Modified Independent             Mobility Comments: walks with a RW at ALF, able to walk to dining room with RW, fell just prior to admission while reaching to floor for her phone ADLs Comments: reports she's independent with bathing/dressing     Extremity/Trunk Assessment   Upper  Extremity Assessment Upper Extremity Assessment: Overall WFL for tasks assessed    Lower Extremity Assessment Lower Extremity Assessment: Generalized weakness;RLE deficits/detail;LLE deficits/detail RLE Deficits / Details: 4/5 knee ext RLE Sensation: decreased light touch LLE Deficits / Details: 4/5 knee ext LLE Sensation: decreased light touch       Communication   Communication Communication: No apparent difficulties  Cognition Arousal: Alert Behavior During Therapy: WFL for tasks assessed/performed Overall Cognitive Status: Within Functional Limits for tasks assessed                                          General Comments      Exercises     Assessment/Plan    PT Assessment Patient needs continued PT services  PT Problem List Decreased activity tolerance;Decreased mobility;Decreased balance       PT Treatment Interventions DME instruction;Gait training;Functional mobility training;Therapeutic activities;Patient/family education;Therapeutic exercise;Balance training    PT Goals (Current goals can be found in the Care Plan section)  Acute Rehab PT Goals Patient Stated Goal: return to walking with RW PT Goal Formulation: With patient Time For Goal Achievement: 11/13/22 Potential to Achieve Goals: Good    Frequency Min 1X/week     Co-evaluation               AM-PAC PT "6 Clicks" Mobility  Outcome Measure Help needed turning from your back to your side while in a flat bed without using bedrails?: None Help needed moving from lying on your back to sitting on the side of a flat bed without using bedrails?: A Little Help needed moving to and from a bed to a chair (including a wheelchair)?: A Little Help needed standing up from a chair using your arms (e.g., wheelchair or bedside chair)?: A Little Help needed to walk in hospital room?: A Lot Help needed climbing 3-5 steps with a railing? : Total 6 Click Score: 16    End of Session  Equipment Utilized During Treatment: Gait belt Activity Tolerance: Patient tolerated treatment well Patient left: in chair;with call bell/phone within reach;with chair alarm set Nurse Communication: Mobility status PT Visit Diagnosis: History of falling (Z91.81);Other abnormalities of gait and mobility (R26.89);Muscle weakness (generalized) (M62.81)    Time: 1610-9604 PT Time Calculation (min) (ACUTE ONLY): 18 min   Charges:   PT Evaluation $PT Eval Moderate Complexity: 1 Mod   PT General Charges $$ ACUTE PT VISIT: 1 Visit        Tamala Ser PT 10/30/2022  Acute Rehabilitation Services  Office [phone]

## 2022-10-30 NOTE — Consult Note (Signed)
[name] is very well-known to me.  She is a very charming 66 year old white female.  She had history of systemic lymphoma that relapse in the brain.  She has had systemic chemotherapy with high-dose methotrexate.  It has probably been a month since she had high-dose chemotherapy.  She recently has been on Imbruvica/Rituxan.  She came to the office last week.  She was not feeling well.  She has some weakness.  She was not eating well.  She has some nausea.  We found that she had a E. coli urinary tract infection.  Unfortunately, we also found that she had recurrence of the lymphoma.  An MRI of the brain that was done last week showed she had extensive CNS recurrence.  Unfortunately, she has declined at the assisted living facility.  She says she has a hard time walking.  Patient was admitted on 10/29/2022.  When she was admitted, her sodium is 129.  Potassium 3.8.  BUN 22 creatinine 0.82.  Her calcium was 9.5 with an albumin of 4.3.  Her white cell count was 8.6.  Hemoglobin 14.3.  Platelet count 180,000.  There is no bowel or bladder incontinence.  She is not eating all that much.  She does not have much of an appetite.  She has had a little bit of a headache.  She says she has had occasional double vision.  She apparently had some eye surgery 2 weeks ago.  She has had no fever.  She has had no bleeding.   Her vital signs show temperature of 97.4.  Pulse 58.  Blood pressure 118/91.  Head and neck exam shows no ocular or oral lesions.  Pupils react appropriately.  She has no adenopathy in the neck.  Lungs are clear bilaterally.  Cardiac exam regular rate and rhythm.  Abdomen is soft.  Bowel sounds are present.  There is no fluid wave.  There is no palpable liver or spleen tip.  Extremities shows movement of the lower extremities.  She has decreased range of motion of the lower extremities but this is symmetric.  Neurological exam shows no focal deficits.   [name] is a very nice  67 year old white female.  She has another relapse of CNS lymphoma..  She has been seen out at [facility].  I think that they would consider her for CAR-T therapy.  They would like to have her lymphoma under better control before they will consider this.  I know she is has already had high-dose methotrexate.  We can always consider high-dose ARA-C.  We could also consider whole brain radiation.  She clearly needs to have a MRI of the spine.  We need to do the entire spine in order to make sure there is no spinal metastasis from her lymphoma.  I know she has had problems with steroids.  Will have to give her some steroids and hopefully she will have no problems.  This is incredibly complicated.  I know her faith is strong.  I know that she is not wish to pursue CAR-T therapy.  For some reason, she think she needs to be at [facility] for 2 months.  Again, we will have to see how we can try to manage the CNS disease again.  Hopefully, she will not have spinal involvement.  I appreciate everybody's help with her on 4 E.  I know that she will get incredible care.    Ariel Bach, MD  Hebrews 12:12

## 2022-10-30 NOTE — ED Notes (Signed)
ED TO INPATIENT HANDOFF REPORT  Name/Age/Gender Ariel [name] 66 y.o. female  Code Status    Code Status Orders  (From admission, onward)           Start     Ordered   10/30/22 0004  Full code  Continuous       Question:  By:  Answer:  Consent: discussion documented in EHR   10/30/22 0004           Code Status History     Date Active Date Inactive Code Status Order ID Comments User Context   05/08/2022 0943 05/09/2022 0515 Full Code [id number]  Richarda Overlie, MD HOV   03/17/2022 1158 04/14/2022 1807 Full Code [id number]  [place], Eric J, DO ED   08/01/2021 0927 08/06/2021 1529 Full Code [id number]  [name], MD Inpatient   07/04/2021 0933 07/09/2021 1447 Full Code [id number]  [name], MD Inpatient   06/06/2021 0807 06/11/2021 1510 Full Code [id number]  [name], MD Inpatient   05/16/2021 1506 05/19/2021 1518 Full Code [id number]  [name], MD Inpatient   05/02/2021 1013 05/07/2021 1425 Full Code [id number]  [name], MD Inpatient   04/04/2021 1049 04/14/2021 2148 Full Code [id number]  [name], MD Inpatient   03/07/2021 0937 03/12/2021 1832 Full Code [id number]  [name], MD Inpatient   01/14/2021 1636 02/09/2021 2255 Full Code [id number]  Bobette Mo, MD Inpatient   12/08/2020 1554 12/16/2020 1914 Full Code [id number]  Orland Mustard, MD ED   10/07/2020 0306 10/07/2020 2308 Full Code [id number]  Chotiner, Claudean Severance, MD Inpatient      Advance Directive Documentation    Flowsheet Row Most Recent Value  Type of Advance Directive Out of facility DNR (pink MOST or yellow form)  Pre-existing out of facility DNR order (yellow form or pink MOST form) --  "MOST" Form in Place? --       Home/SNF/Other Home  Chief Complaint CNS lymphoma (HCC) [C85.89]  Level of Care/Admitting Diagnosis ED Disposition     ED Disposition  Admit   Condition  --   Comment  Hospital Area: [facility] [[id number]]  Level of Care: Progressive  [102]  Admit to Progressive based on following criteria: NEUROLOGICAL AND NEUROSURGICAL complex patients with significant risk of instability, who do not meet ICU criteria, yet require close observation or frequent assessment (< / = every 2 - 4 hours) with medical / nursing intervention.  May admit patient to [facility] or [facility] if equivalent level of care is available:: No  Covid Evaluation: Asymptomatic - no recent exposure (last 10 days) testing not required  Diagnosis: CNS lymphoma [facility]) [[id number]]  Admitting Physician: Therisa Doyne [[id number]]  Attending Physician: Therisa Doyne [[id number]]  Certification:: I certify this patient will need inpatient services for at least 2 midnights  Expected Medical Readiness: 11/01/2022          Medical History Past Medical History:  Diagnosis Date   Allergy    Arthritis    Cataract    Chicken pox    Complication of anesthesia    Per patient, very slow to wake up after anesthesia   Goals of care, counseling/discussion 12/22/2020   High cholesterol    High grade B-cell lymphoma (HCC) 12/22/2020   Hyperglycemia 11/10/2020   Melanoma (HCC) 2021   right hip   Melanoma (HCC) 10/28/2020   pt stated brain liver and bladder   PONV (postoperative nausea and vomiting)  Stroke [facility])    Urinary incontinence     Allergies Allergies  Allergen Reactions   Decadron [Dexamethasone] Other (See Comments)    Leg weakness Hallucinations   Plavix [Clopidogrel] Hives, Swelling and Other (See Comments)    Lip swelling    Prednisone Other (See Comments)    Steroids caused psychological issues  Hallucinations   Sulfa Antibiotics Itching and Rash   Codeine Nausea And Vomiting    IV Location/Drains/Wounds Patient Lines/Drains/Airways Status     Active Line/Drains/Airways     Name Placement date Placement time Site Days   Implanted Port 12/30/20 Right Chest 12/30/20  1046  Chest  669   Peripheral IV 10/29/22 20 G Left Antecubital 10/29/22   2025  Antecubital  1            Labs/Imaging Results for orders placed or performed during the hospital encounter of 10/29/22 (from the past 48 hour(s))  CBG monitoring, ED     Status: Abnormal   Collection Time: 10/29/22  7:38 PM  Result Value Ref Range   Glucose-Capillary 103 (H) 70 - 99 mg/dL    Comment: Glucose reference range applies only to samples taken after fasting for at least 8 hours.  CBC with Differential     Status: Abnormal   Collection Time: 10/29/22  8:22 PM  Result Value Ref Range   WBC 5.7 4.0 - 10.5 K/uL   RBC 4.45 3.87 - 5.11 MIL/uL   Hemoglobin 14.3 12.0 - 15.0 g/dL   HCT 59.5 63.8 - 75.6 %   MCV 91.7 80.0 - 100.0 fL   MCH 32.1 26.0 - 34.0 pg   MCHC 35.0 30.0 - 36.0 g/dL   RDW 43.3 29.5 - 18.8 %   Platelets 180 150 - 400 K/uL   nRBC 0.0 0.0 - 0.2 %   Neutrophils Relative % 72 %   Neutro Abs 4.1 1.7 - 7.7 K/uL   Lymphocytes Relative 13 %   Lymphs Abs 0.7 0.7 - 4.0 K/uL   Monocytes Relative 12 %   Monocytes Absolute 0.7 0.1 - 1.0 K/uL   Eosinophils Relative 0 %   Eosinophils Absolute 0.0 0.0 - 0.5 K/uL   Basophils Relative 1 %   Basophils Absolute 0.0 0.0 - 0.1 K/uL   Immature Granulocytes 2 %   Abs Immature Granulocytes 0.12 (H) 0.00 - 0.07 K/uL    Comment: Performed at [facility], [address] [place], [place] [address]  Comprehensive metabolic panel     Status: Abnormal   Collection Time: 10/29/22  8:22 PM  Result Value Ref Range   Sodium 129 (L) 135 - 145 mmol/L   Potassium 3.8 3.5 - 5.1 mmol/L   Chloride 95 (L) 98 - 111 mmol/L   CO2 25 22 - 32 mmol/L   Glucose, Bld 96 70 - 99 mg/dL    Comment: Glucose reference range applies only to samples taken after fasting for at least 8 hours.   BUN 22 8 - 23 mg/dL   Creatinine, Ser 6.30 0.44 - 1.00 mg/dL   Calcium 9.5 8.9 - 16.0 mg/dL   Total Protein 6.9 6.5 - 8.1 g/dL   Albumin 4.3 3.5 - 5.0 g/dL   AST 19 15 - 41 U/L   ALT 32 0 - 44 U/L   Alkaline Phosphatase 53 38 - 126  U/L   Total Bilirubin 0.9 0.3 - 1.2 mg/dL   GFR, Estimated >10 >93 mL/min    Comment: (NOTE) Calculated using the CKD-EPI  Creatinine Equation (2021)    Anion gap 9 5 - 15    Comment: Performed at [facility], [address] [place], [place] [address]   No results found.  Pending Labs Unresulted Labs (From admission, onward)     Start     Ordered   10/30/22 0500  Prealbumin  Tomorrow morning,   R        10/29/22 2237   10/29/22 2238  Procalcitonin  Add-on,   AD       References:    Procalcitonin Lower Respiratory Tract Infection AND Sepsis Procalcitonin Algorithm   10/29/22 2237   10/29/22 2238  CK  Add-on,   AD        10/29/22 2237   10/29/22 2238  Magnesium  Add-on,   AD        10/29/22 2237   10/29/22 2238  Phosphorus  Add-on,   AD        10/29/22 2237   10/29/22 2238  Hepatic function panel  Add-on,   AD       Question:  Release to patient  Answer:  Immediate   10/29/22 2237   10/29/22 2238  Sodium, urine, random  Once,   URGENT        10/29/22 2237   10/29/22 2238  Creatinine, urine, random  Once,   URGENT        10/29/22 2237   10/29/22 2238  Urinalysis, Complete w Microscopic -Urine, Clean Catch  Once,   URGENT       Question Answer Comment  Release to patient Immediate   Specimen Source Urine, Clean Catch      10/29/22 2237   10/29/22 2238  TSH  Add-on,   AD        10/29/22 2237   10/29/22 2238  Osmolality, urine  Once,   URGENT        10/29/22 2237   10/29/22 2238  Osmolality  Add-on,   AD        10/29/22 2237   Signed and Held  Magnesium  Tomorrow morning,   R        Signed and Held   Signed and Held  Phosphorus  Tomorrow morning,   R        Signed and Held   Signed and Held  Comprehensive metabolic panel  Tomorrow morning,   R       Question:  Release to patient  Answer:  Immediate   Signed and Held   Signed and Held  CBC  Tomorrow morning,   R       Question:  Release to patient  Answer:  Immediate   Signed and Held             Vitals/Pain Today's Vitals   10/29/22 1941 10/29/22 2011 10/29/22 2100  BP: 134/64  123/62  Pulse: 65  60  Resp: 12  17  Temp: 98.6 F (37 C)    TempSrc: Oral    SpO2: 100%  99%  Weight:  151 lb (68.5 kg)   Height:  5\' 7"  (1.702 m)   PainSc:  0-No pain     Isolation Precautions No active isolations  Medications Medications  dexamethasone (DECADRON) injection 4 mg (has no administration in time range)  cefTRIAXone (ROCEPHIN) 1 g in sodium chloride 0.9 % 100 mL IVPB (has no administration in time range)  promethazine (PHENERGAN) 12.5 mg in sodium chloride 0.9 % 50 mL IVPB (0 mg Intravenous Stopped  10/29/22 2104)    Mobility walks

## 2022-10-30 NOTE — Hospital Course (Addendum)
HPI: [name] is a 67 yo female with PMH B-cell lymphoma, SIADH, HLD who presented with worsening balance/gait and generalized weakness. She has recently been found to have relapsed lymphoma with CNS involvement.  She underwent MRI brain on 10/27/2022 which showed evidence of new/recurrent lymphoma involving cerebral and cerebellar areas. She was started on steroids per oncology and further imaging has been ordered.  Significant Events: Admitted 10/29/2022 New/recurrence of CNS lymphoma on MRI brain 10-27-2022, MRI T-spine showed spinal cord lymphoma 11-02-2022 decision made by Dr. Myna Hidalgo to pursue chemo with Ritxuan and high dose methotrexate 11-06-2022 started on leucovorin rescue  Significant Labs: Methotrexate level 11-08-22 of 0.06 Methotrexate level 11-04-22 of 14.2  Significant Imaging Studies: MRI brain 10-27-2022 showed Evidence of new/recurrent lymphoma involving the frontal lobes, corpus callosum, internal capsules, and middle cerebellar peduncles. MRI C, T, L-spine showed Patchy intermittent T2 signal abnormality involving the thoracic cord, extending from approximately T4-5 through T11-12. Associated heterogeneous enhancement from T6-7 through T10-11. Finding presumably reflects changes of CNS lymphoma given findings on recent brain MRI.  Antibiotic Therapy: Rocephin 1 g daily 9-2, 9-3 Keflex 9-3 thru 9-5  Procedures:   Consultants: Oncology - Myna Hidalgo

## 2022-10-30 NOTE — Assessment & Plan Note (Addendum)
-  T-spine involvement on MRI and brain involvement on MRI brain - treated with Decadron, rituxan and high dose MTX per oncology

## 2022-10-30 NOTE — Progress Notes (Signed)
Progress Note    Ariel [name]   GBT:[id number]  DOB: 06-03-1955  DOA: 10/29/2022     1 PCP: Ariel Craze, NP  Initial CC: unstable gait, weakness  Hospital Course: [name] is a 67 yo female with PMH B-cell lymphoma, SIADH, HLD who presented with worsening balance/gait and generalized weakness. She has recently been found to have relapsed lymphoma with CNS involvement.  She underwent MRI brain on 10/27/2022 which showed evidence of new/recurrent lymphoma involving cerebral and cerebellar areas. She was started on steroids per oncology and further imaging has been ordered.  Interval History:  Resting when seen; mostly notes generalized weakness. Understands getting steroids and further discussions being held with specialists regarding if she needs radiation.   Assessment and Plan: * CNS lymphoma (HCC) - Follow-up MRI spine - Continue Decadron per oncology - Possible plan for brain radiation -Follow-up further discussions from oncology and radiation team  High grade B-cell lymphoma (HCC) - follows with Dr. Myna Hidalgo -Now diagnosed with recurrence with CNS involvement.  MRI brain on 10/27/2022 shows recurrent lesions -Follow-up MRI of spine  UTI (urinary tract infection) - Somewhat symptomatic and was unable to take cipro outpatient for a couple days  -Urinalysis from 10/26/2022 consistent with infection despite current UA on admission being negative -Treating the E. coli from 10/26/2022 urine culture currently with Rocephin since she did not complete outpatient course  SIADH (syndrome of inappropriate ADH production) (HCC) - currently Na improved, now 133 - on CLD for now; will advance as able - no fluid restriction needed unless Na drops  Protein-calorie malnutrition, moderate (HCC) - continue encouraging intake  Ataxia - due to cerebellar involvement from CNS recurrence - mild dysmetria on exam - continue supportive care for now   Old records reviewed in assessment  of this patient  Antimicrobials: Rocephin 9/2 >> current   DVT prophylaxis:  SCDs Start: 10/30/22 0116   Code Status:   Code Status: Full Code  Mobility Assessment (Last 72 Hours)     Mobility Assessment     Row Name 10/30/22 0957 10/30/22 0200 10/30/22 0154       Does patient have an order for bedrest or is patient medically unstable No - Continue assessment No - Continue assessment No - Continue assessment     What is the highest level of mobility based on the progressive mobility assessment? Level 3 (Stands with assist) - Balance while standing  and cannot march in place Level 2 (Chairfast) - Balance while sitting on edge of bed and cannot stand --     Is the above level different from baseline mobility prior to current illness? Yes - Recommend PT order Yes - Recommend PT order --              Barriers to discharge: none Disposition Plan:  Home Status is: inpt   Objective: Blood pressure (!) 104/54, pulse (!) 55, temperature (!) 97.5 F (36.4 C), temperature source Oral, resp. rate 16, height 5\' 7"  (1.702 m), weight 68.5 kg, SpO2 99%.  Examination:  Physical Exam Constitutional:      General: She is not in acute distress. HENT:     Head: Normocephalic and atraumatic.     Mouth/Throat:     Mouth: Mucous membranes are dry.  Eyes:     Extraocular Movements: Extraocular movements intact.  Cardiovascular:     Rate and Rhythm: Normal rate and regular rhythm.  Pulmonary:     Effort: Pulmonary effort is normal.     Breath  sounds: Normal breath sounds.  Abdominal:     General: Bowel sounds are normal. There is no distension.     Palpations: Abdomen is soft.     Tenderness: There is no abdominal tenderness.  Musculoskeletal:        General: No swelling. Normal range of motion.     Cervical back: Normal range of motion and neck supple.  Skin:    General: Skin is warm and dry.  Neurological:     Mental Status: She is alert.     Comments: No focal weakness. Mild  dysmetria in finger to nose  Psychiatric:        Mood and Affect: Mood normal.      Consultants:  Oncology  Procedures:    Data Reviewed: Results for orders placed or performed during the hospital encounter of 10/29/22 (from the past 24 hour(s))  CBG monitoring, ED     Status: Abnormal   Collection Time: 10/29/22  7:38 PM  Result Value Ref Range   Glucose-Capillary 103 (H) 70 - 99 mg/dL  CBC with Differential     Status: Abnormal   Collection Time: 10/29/22  8:22 PM  Result Value Ref Range   WBC 5.7 4.0 - 10.5 K/uL   RBC 4.45 3.87 - 5.11 MIL/uL   Hemoglobin 14.3 12.0 - 15.0 g/dL   HCT 16.1 09.6 - 04.5 %   MCV 91.7 80.0 - 100.0 fL   MCH 32.1 26.0 - 34.0 pg   MCHC 35.0 30.0 - 36.0 g/dL   RDW 40.9 81.1 - 91.4 %   Platelets 180 150 - 400 K/uL   nRBC 0.0 0.0 - 0.2 %   Neutrophils Relative % 72 %   Neutro Abs 4.1 1.7 - 7.7 K/uL   Lymphocytes Relative 13 %   Lymphs Abs 0.7 0.7 - 4.0 K/uL   Monocytes Relative 12 %   Monocytes Absolute 0.7 0.1 - 1.0 K/uL   Eosinophils Relative 0 %   Eosinophils Absolute 0.0 0.0 - 0.5 K/uL   Basophils Relative 1 %   Basophils Absolute 0.0 0.0 - 0.1 K/uL   Immature Granulocytes 2 %   Abs Immature Granulocytes 0.12 (H) 0.00 - 0.07 K/uL  Comprehensive metabolic panel     Status: Abnormal   Collection Time: 10/29/22  8:22 PM  Result Value Ref Range   Sodium 129 (L) 135 - 145 mmol/L   Potassium 3.8 3.5 - 5.1 mmol/L   Chloride 95 (L) 98 - 111 mmol/L   CO2 25 22 - 32 mmol/L   Glucose, Bld 96 70 - 99 mg/dL   BUN 22 8 - 23 mg/dL   Creatinine, Ser 7.82 0.44 - 1.00 mg/dL   Calcium 9.5 8.9 - 95.6 mg/dL   Total Protein 6.9 6.5 - 8.1 g/dL   Albumin 4.3 3.5 - 5.0 g/dL   AST 19 15 - 41 U/L   ALT 32 0 - 44 U/L   Alkaline Phosphatase 53 38 - 126 U/L   Total Bilirubin 0.9 0.3 - 1.2 mg/dL   GFR, Estimated >21 >30 mL/min   Anion gap 9 5 - 15  Procalcitonin     Status: None   Collection Time: 10/30/22  1:29 AM  Result Value Ref Range   Procalcitonin  <0.10 ng/mL  CK     Status: None   Collection Time: 10/30/22  1:29 AM  Result Value Ref Range   Total CK 41 38 - 234 U/L  Magnesium     Status: None  Collection Time: 10/30/22  1:29 AM  Result Value Ref Range   Magnesium 2.3 1.7 - 2.4 mg/dL  Phosphorus     Status: None   Collection Time: 10/30/22  1:29 AM  Result Value Ref Range   Phosphorus 3.1 2.5 - 4.6 mg/dL  Hepatic function panel     Status: None   Collection Time: 10/30/22  1:29 AM  Result Value Ref Range   Total Protein 6.6 6.5 - 8.1 g/dL   Albumin 4.0 3.5 - 5.0 g/dL   AST 18 15 - 41 U/L   ALT 28 0 - 44 U/L   Alkaline Phosphatase 48 38 - 126 U/L   Total Bilirubin 0.6 0.3 - 1.2 mg/dL   Bilirubin, Direct 0.1 0.0 - 0.2 mg/dL   Indirect Bilirubin 0.5 0.3 - 0.9 mg/dL  Prealbumin     Status: None   Collection Time: 10/30/22  1:29 AM  Result Value Ref Range   Prealbumin 30 18 - 38 mg/dL  TSH     Status: None   Collection Time: 10/30/22  1:29 AM  Result Value Ref Range   TSH 0.714 0.350 - 4.500 uIU/mL  Osmolality     Status: None   Collection Time: 10/30/22  1:29 AM  Result Value Ref Range   Osmolality 281 275 - 295 mOsm/kg  Magnesium     Status: None   Collection Time: 10/30/22  5:16 AM  Result Value Ref Range   Magnesium 2.4 1.7 - 2.4 mg/dL  Phosphorus     Status: None   Collection Time: 10/30/22  5:16 AM  Result Value Ref Range   Phosphorus 3.5 2.5 - 4.6 mg/dL  Comprehensive metabolic panel     Status: Abnormal   Collection Time: 10/30/22  5:16 AM  Result Value Ref Range   Sodium 130 (L) 135 - 145 mmol/L   Potassium 3.9 3.5 - 5.1 mmol/L   Chloride 97 (L) 98 - 111 mmol/L   CO2 23 22 - 32 mmol/L   Glucose, Bld 119 (H) 70 - 99 mg/dL   BUN 21 8 - 23 mg/dL   Creatinine, Ser 1.61 0.44 - 1.00 mg/dL   Calcium 9.3 8.9 - 09.6 mg/dL   Total Protein 6.8 6.5 - 8.1 g/dL   Albumin 4.1 3.5 - 5.0 g/dL   AST 20 15 - 41 U/L   ALT 33 0 - 44 U/L   Alkaline Phosphatase 50 38 - 126 U/L   Total Bilirubin 0.6 0.3 - 1.2 mg/dL    GFR, Estimated >04 >54 mL/min   Anion gap 10 5 - 15  CBC     Status: None   Collection Time: 10/30/22  5:16 AM  Result Value Ref Range   WBC 8.6 4.0 - 10.5 K/uL   RBC 4.55 3.87 - 5.11 MIL/uL   Hemoglobin 14.7 12.0 - 15.0 g/dL   HCT 09.8 11.9 - 14.7 %   MCV 93.2 80.0 - 100.0 fL   MCH 32.3 26.0 - 34.0 pg   MCHC 34.7 30.0 - 36.0 g/dL   RDW 82.9 56.2 - 13.0 %   Platelets 175 150 - 400 K/uL   nRBC 0.0 0.0 - 0.2 %  Sodium, urine, random     Status: None   Collection Time: 10/30/22  5:57 AM  Result Value Ref Range   Sodium, Ur 50 mmol/L  Creatinine, urine, random     Status: None   Collection Time: 10/30/22  5:57 AM  Result Value Ref Range   Creatinine,  Urine 119 mg/dL  Osmolality, urine     Status: None   Collection Time: 10/30/22  5:57 AM  Result Value Ref Range   Osmolality, Ur 528 300 - 900 mOsm/kg  Urinalysis, Complete w Microscopic -Urine, Clean Catch     Status: Abnormal   Collection Time: 10/30/22  5:58 AM  Result Value Ref Range   Color, Urine YELLOW YELLOW   APPearance CLEAR CLEAR   Specific Gravity, Urine 1.015 1.005 - 1.030   pH 6.0 5.0 - 8.0   Glucose, UA NEGATIVE NEGATIVE mg/dL   Hgb urine dipstick SMALL (A) NEGATIVE   Bilirubin Urine NEGATIVE NEGATIVE   Ketones, ur 20 (A) NEGATIVE mg/dL   Protein, ur NEGATIVE NEGATIVE mg/dL   Nitrite NEGATIVE NEGATIVE   Leukocytes,Ua NEGATIVE NEGATIVE   RBC / HPF 0-5 0 - 5 RBC/hpf   WBC, UA 0-5 0 - 5 WBC/hpf   Bacteria, UA NONE SEEN NONE SEEN   Squamous Epithelial / HPF 0-5 0 - 5 /HPF  Basic metabolic panel     Status: Abnormal   Collection Time: 10/30/22 11:50 AM  Result Value Ref Range   Sodium 133 (L) 135 - 145 mmol/L   Potassium 3.7 3.5 - 5.1 mmol/L   Chloride 100 98 - 111 mmol/L   CO2 22 22 - 32 mmol/L   Glucose, Bld 197 (H) 70 - 99 mg/dL   BUN 21 8 - 23 mg/dL   Creatinine, Ser 1.61 0.44 - 1.00 mg/dL   Calcium 9.4 8.9 - 09.6 mg/dL   GFR, Estimated >04 >54 mL/min   Anion gap 11 5 - 15    I have reviewed pertinent  nursing notes, vitals, labs, and images as necessary. I have ordered labwork to follow up on as indicated.  I have reviewed the last notes from staff over past 24 hours. I have discussed patient's care plan and test results with nursing staff, CM/SW, and other staff as appropriate.  Time spent: Greater than 50% of the 55 minute visit was spent in counseling/coordination of care for the patient as laid out in the A&P.   LOS: 1 day   Lewie Chamber, MD [facility] 10/30/2022, 2:01 PM

## 2022-10-30 NOTE — Assessment & Plan Note (Addendum)
-   Somewhat symptomatic and was unable to take cipro outpatient for a couple days  -Urinalysis from 10/26/2022 consistent with infection despite current UA on admission being negative -Treated the E. coli from 10/26/2022 urine culture; s/p rocephin x 2 days and finished with 3 days of keflex

## 2022-10-30 NOTE — TOC Initial Note (Signed)
Transition of Care [facility]) - Initial/Assessment Note    Patient Details  Name: Ariel [name] MRN: [id number] Date of Birth: [birth year]  Transition of Care [facility]) CM/SW Contact:    Howell Rucks, RN Phone Number: 10/30/2022, 1:31 PM  Clinical Narrative:  Met with pt at bedside to introduce role of TOC/NCM and review for dc planning, PT recommendation for [facility] PT, pt agreeable. Pt dozing during assessment. NCM called to [facility], sw Adella Nissen, confirmed pt is resident of ILF and that [facility] is facility [facility] agency. Text sent to attending for order. TOC will continue to follow.                  Expected Discharge Plan: Home w Home Health Services Barriers to Discharge: Continued Medical Work up   Patient Goals and CMS Choice Patient states their goals for this hospitalization and ongoing recovery are:: return to [facility]          Expected Discharge Plan and Services       Living arrangements for the past 2 months: Independent Living Facility                           HH Arranged: PT [facility], has contract with [employer])          Prior Living Arrangements/Services Living arrangements for the past 2 months: Independent Living Facility Lives with:: Self Patient language and need for interpreter reviewed:: Yes Do you feel safe going back to the place where you live?: Yes      Need for Family Participation in Patient Care: Yes (Comment) Care giver support system in place?: Yes (comment)   Criminal Activity/Legal Involvement Pertinent to Current Situation/Hospitalization: No - Comment as needed  Activities of Daily Living Home Assistive Devices/Equipment: Wheelchair ADL Screening (condition at time of admission) Patient's cognitive ability adequate to safely complete daily activities?: Yes Is the patient deaf or have difficulty hearing?: No Does the patient have difficulty seeing, even when wearing glasses/contacts?: No Does the patient have  difficulty concentrating, remembering, or making decisions?: No Patient able to express need for assistance with ADLs?: Yes Does the patient have difficulty dressing or bathing?: Yes Independently performs ADLs?: No Communication: Independent Dressing (OT): Needs assistance Grooming: Needs assistance Feeding: Independent Bathing: Needs assistance Toileting: Needs assistance In/Out Bed: Dependent Walks in Home: Dependent Does the patient have difficulty walking or climbing stairs?: Yes Weakness of Legs: Both Weakness of Arms/Hands: None  Permission Sought/Granted Permission sought to share information with : Case Manager Permission granted to share information with : Yes, Verbal Permission Granted  Share Information with NAME: Fannie Knee, RN           Emotional Assessment Appearance:: Appears stated age Attitude/Demeanor/Rapport: Gracious Affect (typically observed): Accepting Orientation: : Oriented to Self, Oriented to Place, Oriented to  Time, Oriented to Situation Alcohol / Substance Use: Not Applicable Psych Involvement: No (comment)  Admission diagnosis:  Gait instability [R26.81] CNS lymphoma (HCC) [C85.89] Nausea and vomiting, unspecified vomiting type [R11.2] Patient Active Problem List   Diagnosis Date Noted   UTI (urinary tract infection) 10/30/2022   Ataxia 10/29/2022   Leukocytosis 04/14/2022   Thrombocytosis, unspecified 04/14/2022   Epistaxis 04/08/2022   COVID-19 virus infection 04/05/2022   Hypokalemia 04/05/2022   Hypophosphatemia 04/05/2022   SIADH (syndrome of inappropriate ADH production) (HCC) 04/05/2022   Protein-calorie malnutrition, moderate (HCC) 03/28/2022   Palliative care by specialist 03/23/2022   Aphasia 03/17/2022  CNS lymphoma (HCC) 03/10/2022   Primary localized osteoarthritis of right hip 01/10/2022   Primary localized osteoarthritis of left hip 10/11/2021   Preoperative clearance 09/13/2021   Encounter for screening mammogram  for malignant neoplasm of breast 09/13/2021   Pancytopenia (HCC) 05/16/2021   Lymphoma malignant, 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   Diffuse large B cell lymphoma (HCC) 03/07/2021   High grade B-cell lymphoma (HCC) 12/22/2020   Goals of care, counseling/discussion 12/22/2020   Malignant neoplasm metastatic to brain (HCC) 12/09/2020   Brain tumor (HCC) 12/08/2020   Night sweat 11/30/2020   Complication of anesthesia 11/22/2020   PONV (postoperative nausea and vomiting) 11/22/2020   Hyperglycemia 11/10/2020   TIA (transient ischemic attack) 10/07/2020   Iron deficiency anemia 10/07/2020   Melanoma (HCC) 2021   Multinodular goiter 12/22/2013   Subclinical hyperthyroidism 12/19/2013   Urinary incontinence 11/19/2013   Hyperlipidemia 11/19/2012   Routine general medical examination at a health care facility 11/18/2012   PCP:  Sandford Craze, NP Pharmacy:   [facility] [address] [place] [address] Phone: [phone] Fax: [phone]  [facility] [place], [place] - [address] [place] [address] Phone: [phone] Fax: [phone]  [facility] - [place], [place] - [address] [place] [address] Phone: [phone] Fax: [phone]     Social Determinants of Health (SDOH) Social History: SDOH Screenings   Food Insecurity: No Food Insecurity (10/30/2022)  Housing: Low Risk  (10/30/2022)  Transportation Needs: No Transportation Needs (10/30/2022)  Utilities: Not At Risk (10/30/2022)  Alcohol Screen: Low Risk  (09/05/2022)  Depression (PHQ2-9): Low Risk  (09/06/2022)  Financial Resource Strain: Low Risk  (09/05/2022)  Physical Activity: Sufficiently Active (09/05/2022)  Social Connections: Moderately Isolated (09/05/2022)  Stress: No Stress Concern Present (09/05/2022)  Tobacco Use: Low Risk  (10/29/2022)   SDOH Interventions:     Readmission Risk  Interventions    10/30/2022    1:29 PM 04/14/2022   10:59 AM 03/27/2022    3:00 PM  Readmission Risk Prevention Plan  Transportation Screening Complete Complete Complete  PCP or Specialist Appt within 5-7 Days Complete    Home Care Screening Complete    Medication Review (RN CM) Complete    Medication Review Oceanographer)  Complete Complete  PCP or Specialist appointment within 3-5 days of discharge  Complete Complete  HRI or Home Care Consult  Complete Complete  SW Recovery Care/Counseling Consult   Complete  Palliative Care Screening   Complete  Skilled Nursing Facility   Complete

## 2022-10-30 NOTE — Assessment & Plan Note (Addendum)
Stable

## 2022-10-30 NOTE — Progress Notes (Signed)
Initial Nutrition Assessment  DOCUMENTATION CODES:   Non-severe (moderate) malnutrition in context of chronic illness  INTERVENTION:   -Boost Breeze po TID, each supplement provides 250 kcal and 9 grams of protein while on clear liquids  -Once on full liquids or solids, recommend Ensure Plus High Protein po TID, each supplement provides 350 kcal and 20 grams of protein. Pt prefers chocolate flavor. -Multivitamin with minerals daily  NUTRITION DIAGNOSIS:   Moderate Malnutrition related to chronic illness, cancer and cancer related treatments as evidenced by moderate fat depletion, moderate muscle depletion, energy intake < 75% for > 7 days.  GOAL:   Patient will meet greater than or equal to 90% of their needs  MONITOR:   PO intake, Supplement acceptance, Labs, Weight trends, I & O's  REASON FOR ASSESSMENT:   Consult Assessment of nutrition requirement/status  ASSESSMENT:   67 y.o. female with medical history significant of B-cell lymphoma, SIADH. Presented with ataxia nausea vomiting.  Patient in room, very soft spoken and fatigued. Pt reports she is having no nausea today. Tolerating clear liquids currently. Drank 1 Boost Breeze so far today. Agreeable to chocolate Ensure once diet is advanced. Reports that she last tolerated any solid food on 8/25. Reports she started vomiting up everything even medications on Monday and Tuesday (8/26 and 8/27).  Typically pt eats okay, she is provided meals from her facility and she likes the food there. Pt takes vitamins but was unable to list them. Denies any issues with swallowing or chewing.   Pt reports UBW ~ 155 lbs. Current weight: 151 lbs.  Medications reviewed.  Labs reviewed: Low Na   NUTRITION - FOCUSED PHYSICAL EXAM:  Flowsheet Row Most Recent Value  Orbital Region Moderate depletion  Upper Arm Region Moderate depletion  Thoracic and Lumbar Region Mild depletion  Buccal Region Mild depletion  Temple Region Moderate  depletion  Clavicle Bone Region Moderate depletion  Clavicle and Acromion Bone Region Moderate depletion  Scapular Bone Region Moderate depletion  Dorsal Hand Moderate depletion  Patellar Region Mild depletion  Anterior Thigh Region Mild depletion  Posterior Calf Region Mild depletion  Edema (RD Assessment) None  Hair Reviewed  Eyes Reviewed  Mouth Reviewed  Skin Reviewed       Diet Order:   Diet Order             Diet clear liquid Room service appropriate? Yes; Fluid consistency: Thin  Diet effective now                   EDUCATION NEEDS:   No education needs have been identified at this time  Skin:  Skin Assessment: Reviewed RN Assessment  Last BM:  9/1  Height:   Ht Readings from Last 1 Encounters:  10/29/22 5\' 7"  (1.702 m)    Weight:   Wt Readings from Last 1 Encounters:  10/29/22 68.5 kg    BMI:  Body mass index is 23.65 kg/m.  Estimated Nutritional Needs:   Kcal:  2050-2250  Protein:  105-115g  Fluid:  2.1L/day   Tilda Franco, MS, RD, LDN Inpatient Clinical Dietitian Contact information available via Amion

## 2022-10-30 NOTE — Progress Notes (Signed)
Pt is alert and oriented x 4.Pt 1st EKG shows stemi, then rechecked its NSR, apparently pt c/o mid upper back sharp pain. Denies chest pain. Notified Dr. Kara Pacer and Nelia Shi. Per Chinita Greenland NP,  both EKG`s NSR with slight artifact in a lead. No new orders made.

## 2022-10-31 ENCOUNTER — Ambulatory Visit
Admit: 2022-10-31 | Discharge: 2022-10-31 | Disposition: A | Discharge status: Home / Self Care | Payer: Medicare Other | Attending: Radiation Oncology | Admitting: Radiation Oncology

## 2022-10-31 DIAGNOSIS — C8510 Unspecified B-cell lymphoma, unspecified site: Secondary | ICD-10-CM | POA: Diagnosis not present

## 2022-10-31 DIAGNOSIS — N3000 Acute cystitis without hematuria: Secondary | ICD-10-CM | POA: Diagnosis not present

## 2022-10-31 DIAGNOSIS — C8339 Primary central nervous system lymphoma: Secondary | ICD-10-CM

## 2022-10-31 DIAGNOSIS — C8589 Other specified types of non-Hodgkin lymphoma, extranodal and solid organ sites: Secondary | ICD-10-CM | POA: Diagnosis not present

## 2022-10-31 DIAGNOSIS — C8330 Diffuse large B-cell lymphoma, unspecified site: Secondary | ICD-10-CM

## 2022-10-31 DIAGNOSIS — Z79899 Other long term (current) drug therapy: Secondary | ICD-10-CM | POA: Diagnosis not present

## 2022-10-31 DIAGNOSIS — C7931 Secondary malignant neoplasm of brain: Secondary | ICD-10-CM | POA: Diagnosis not present

## 2022-10-31 DIAGNOSIS — C7951 Secondary malignant neoplasm of bone: Secondary | ICD-10-CM | POA: Diagnosis not present

## 2022-10-31 MED ORDER — OLANZAPINE 5 MG PO TABS
10.0000 mg | ORAL_TABLET | Freq: Every day | ORAL | Status: DC
  Administered 2022-10-31 – 2022-11-06 (×4): 10 mg via ORAL
  Filled 2022-10-31 (×6): qty 2

## 2022-10-31 MED ORDER — CHLORHEXIDINE GLUCONATE CLOTH 2 % EX PADS
6.0000 | MEDICATED_PAD | Freq: Every day | CUTANEOUS | Status: DC
  Administered 2022-10-31 – 2022-11-08 (×9): 6 via TOPICAL

## 2022-10-31 MED ORDER — CEPHALEXIN 500 MG PO CAPS
500.0000 mg | ORAL_CAPSULE | Freq: Two times a day (BID) | ORAL | Status: DC
  Administered 2022-10-31 – 2022-11-02 (×5): 500 mg via ORAL
  Filled 2022-10-31 (×5): qty 1

## 2022-10-31 MED ORDER — BISACODYL 5 MG PO TBEC
5.0000 mg | DELAYED_RELEASE_TABLET | Freq: Every day | ORAL | Status: DC | PRN
  Administered 2022-10-31 – 2022-11-07 (×3): 5 mg via ORAL
  Filled 2022-10-31 (×3): qty 1

## 2022-10-31 MED ORDER — SODIUM CHLORIDE 0.9% FLUSH: 10.0000 mL | INTRAVENOUS | Status: DC | PRN

## 2022-10-31 MED ORDER — FAMOTIDINE 20 MG PO TABS
40.0000 mg | ORAL_TABLET | Freq: Two times a day (BID) | ORAL | Status: DC
  Administered 2022-10-31 – 2022-11-03 (×7): 40 mg via ORAL
  Filled 2022-10-31 (×8): qty 2

## 2022-10-31 MED ORDER — SODIUM BICARBONATE/SODIUM CHLORIDE MOUTHWASH
OROMUCOSAL | Status: DC
  Administered 2022-11-01 – 2022-11-02 (×2): 1 via OROMUCOSAL
  Filled 2022-10-31 (×3): qty 1000

## 2022-10-31 MED ORDER — SODIUM CHLORIDE 0.9% FLUSH
10.0000 mL | Freq: Two times a day (BID) | INTRAVENOUS | Status: DC
  Administered 2022-10-31 – 2022-11-08 (×13): 10 mL

## 2022-10-31 NOTE — Progress Notes (Signed)
Progress Note    Unborn Carreon   HYQ:[id number]  DOB: 10-26-1955  DOA: 10/29/2022     2 PCP: Sandford Craze, NP  Initial CC: unstable gait, weakness  Hospital Course: Ms. Motamedi is a 67 yo female with PMH B-cell lymphoma, SIADH, HLD who presented with worsening balance/gait and generalized weakness. She has recently been found to have relapsed lymphoma with CNS involvement.  She underwent MRI brain on 10/27/2022 which showed evidence of new/recurrent lymphoma involving cerebral and cerebellar areas. She was started on steroids per oncology and further imaging has been ordered.  Interval History:  No events overnight. Feeling a little better just in general she says today. Possibly from steroids. No other concerns at this time.   Assessment and Plan: * CNS lymphoma (HCC) -T-spine involvement on MRI and brain involvement on MRI brain - Continue Decadron per oncology - Possible plan for brain radiation -Follow-up further discussions from oncology and radiation team  High grade B-cell lymphoma (HCC) - follows with Dr. Myna Hidalgo -Now diagnosed with recurrence with CNS involvement.  MRI brain on 10/27/2022 shows recurrent lesions -MRI T-spine also showing signal abnormality concerning for CNS lymphoma as well.  Oncology also considering possible LP - management deferred to oncology and rad onc regarding ongoing hospitalization  UTI (urinary tract infection) - Somewhat symptomatic and was unable to take cipro outpatient for a couple days  -Urinalysis from 10/26/2022 consistent with infection despite current UA on admission being negative -Treating the E. 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 - mild dysmetria on exam - continue supportive care for now   Old records reviewed in assessment of this patient  Antimicrobials: Rocephin 9/2 >> 9/3 Keflex 9/3 >> current   DVT prophylaxis:  SCDs Start: 10/30/22 0116   Code Status:   Code Status: Full Code  Mobility Assessment (Last 72 Hours)     Mobility Assessment     Row Name 10/30/22 2027 10/30/22 0957 10/30/22 0200 10/30/22 0154     Does patient have an order for bedrest or is patient medically unstable No - Continue assessment No - Continue assessment No - Continue assessment No - Continue assessment    What is the highest level of mobility based on the progressive mobility assessment? Level 3 (Stands with assist) - Balance while standing  and cannot march in place Level 3 (Stands with assist) - Balance while standing  and cannot march in place Level 2 (Chairfast) - Balance while sitting on edge of bed and cannot stand --    Is the above level different from baseline mobility prior to current illness? Yes - Recommend PT order Yes - Recommend PT order Yes - Recommend PT order --             Barriers to discharge: none Disposition Plan:  Home Status is: inpt   Objective: Blood pressure 126/67, pulse (!) 59, temperature 97.8 F (36.6 C), temperature source Bladder, resp. rate 19, height 5\' 7"  (1.702 m), weight 68.5 kg, SpO2 96%.  Examination:  Physical Exam Constitutional:      General: She is not in acute distress. HENT:     Head: Normocephalic and atraumatic.     Mouth/Throat:     Mouth: Mucous  membranes are moist.  Eyes:     Extraocular Movements: Extraocular movements intact.  Cardiovascular:     Rate and Rhythm: Normal rate and regular rhythm.  Pulmonary:     Effort: Pulmonary effort is normal.     Breath sounds: Normal breath sounds.  Abdominal:     General: Bowel sounds are normal. There is no distension.     Palpations: Abdomen is soft.     Tenderness:  There is no abdominal tenderness.  Musculoskeletal:        General: No swelling. Normal range of motion.     Cervical back: Normal range of motion and neck supple.  Skin:    General: Skin is warm and dry.  Neurological:     Mental Status: She is alert.     Comments: No focal weakness. Mild dysmetria in finger to nose  Psychiatric:        Mood and Affect: Mood normal.      Consultants:  Oncology  Procedures:    Data Reviewed: No results found for this or any previous visit (from the past 24 hour(s)).   I have reviewed pertinent nursing notes, vitals, labs, and images as necessary. I have ordered labwork to follow up on as indicated.  I have reviewed the last notes from staff over past 24 hours. I have discussed patient's care plan and test results with nursing staff, CM/SW, and other staff as appropriate.    LOS: 2 days   Lewie Chamber, MD [facility] 10/31/2022, 2:03 PM

## 2022-10-31 NOTE — Progress Notes (Signed)
PHYSICAL THERAPY  Pt in bed just finished lunch requesting to rest.  Pt has been evaluated with rec for [facility] PT.  Will attempt to see another day as schedule permits.  Felecia Shelling  PTA Acute  Rehabilitation Services Office M-F          [phone]

## 2022-10-31 NOTE — Progress Notes (Signed)
Radiation Oncology         [phone] ________________________________  Inpatient Re-consultation  Name: Ariel [name] MRN: [id number]  Date: 10/31/2022  DOB: [birth year]  CC:O'Sullivan, Efraim Kaufmann, NP  [name], MD   REFERRING PHYSICIAN: [name], MD  DIAGNOSIS:    ICD-10-CM   1. Lymphoma malignant, 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HCC)  C83.30     2. CNS lymphoma (HCC)  C85.89        Cancer Staging  CNS lymphoma [facility]) Staging form: Brain and Spinal Cord, AJCC Version 9 - Clinical stage from 10/31/2022: No stage assigned - Unsigned Histopathologic type: Malignant lymphoma, large B-cell, diffuse, NOS Stage prefix: Recurrence  Melanoma (HCC) Staging form: Melanoma of the Skin, AJCC 8th Edition - Clinical stage from 12/01/2020: Stage IB (cT1b, cN0, cM0) - Signed by [name], MD on 12/01/2020  Diffuse large cell non-Hodgkin's lymphoma-CNS involvement - STAGE IV. Now with evidence of new/recurrent lymphoma involving the frontal lobes, corpus callosum, internal capsules, middle cerebellar peduncles, and thoracic spine (August/Sep 2024)   History of stage I melanoma s/p resection in August 2021   CHIEF COMPLAINT: Here to discuss management of brain metastases from diffuse large cell non-Hodgkin's lymphoma primary.  HISTORY OF PRESENT ILLNESS::Ariel [name] is a 67 y.o. female who presents today to discuss the role of radiation therapy in management of her recurrent brain metastases from a non-hodgkin's lymphoma primary. To review, she was initially seen here in consultation on 03/10/22. During which time, we reviewed her recent MRI which showed 2 new areas of disease in the brain (in the left frontal operculum in the left cerebellum) s/p chemotherapy. She ultimately opted to proceed with a restaging PET scan to help her decide on pursuing radiation treatment. Based on PET results (detailed below), and her own preference, the patient opted to omit radiation therapy at that time.    Subsequent PET scan on 03/10/22 demonstrated an increase in the nonspecific multifocal and confluence hypermetabolic ground-glass opacities in the right lower lobe though to reflect an infectious or inflammatory etiology, and low level metabolic activity within a fluid filled level the right maxillary sinus possibly reflecting acute sinusitis. PET otherwise showed no evidence of hypermetabolic adenopathy above or below the diaphragm, and no evidence of splenomegaly.   Shortly after her PET scan, the patient was hospitalized secondary to disease progression from 03/17/22 through 04/14/22 after presenting to the [facility] ED with acute onset of right-sided facial droop, dysarthria, and aphasia. CT of the head performed in the ED revealed increased edema in the area of the left frontal lobe lesion without evidence of acute hemorrhage or evidence of CVA. MRI of the brain further revealed evidence of lymphoma disease progression in the left frontal operculum with improvement in the cerebellum. Hospital course ultimately consisted of high-dose steroids and initiation of chemotherapy (MTV) at [facility] on 01/22 per Dr. Myna Hidalgo. She also tested positive for COVID while inpatient and received paxlovid and a course of azithromycin for COVID induced PNA.   After her first cycle of MTV, the patient developed persistent issues with thrombocytopenia. MTV was subsequently discontinued and she was transitioned to Imbruvica/Rituxan on 05/02/22 per Dr. Myna Hidalgo. Of note: MRI of the brain on 04/25/22 did show a positive treatment response with complete resolution of the previously seen enhancement in the left frontal lobe and left middle cerebellar peduncle.   Prior to initiating Imbruvica/Rituxan therapy, she underwent a bone marrow biopsy on 05/08/22 which showed less that 1% B cells detected, and patchy cellular marrow (50  to 40%) with areas of prominent dense  collagen fibrosis with otherwise orderly trilineage hematopoiesis.  MRI of  the brain on 06/26/22 s/p 4 cycles of Imbruvica/Rituxan showed continued resolution of the lymphoma without residual enhancement noted.   She was also evaluated by [facility] around that time for consideration of CAR-T therapy, however given her positive treatment response on Imbruvica/Rituxan, CAR-T therapy was not recommended. [facility] did recommend proceeding with Imbruvica alone and she received her 5th and final cycle of Rituxan 06/29/22. (She did stop Imbruvica momentarily in anticipation of a tooth extraction and cataract surgery. She was instructed to resume Imbruvica in late July).   Restaging PET scan on 09/01/22 showed no hypermetabolic adenopathy above or below the diaphragm, and no splenomegaly or scintigraphic evidence of osseous lymphomatous involvement. She also presented for a repeat MRI of the brain on 09/05/22 which showed no evidence of residual or new intracranial metastatic disease.   Current admission:   Although imaging showed a great response to treatment, the patient presented to Dr. Myna Hidalgo on 10/26/22 with low energy/fatigue and increasing nausea with dehydration. Due to concerns for UTI, she was started on Rocephin and Cipro.   To rule out disease progression, a repeat MRI of the brain was subsequently performed on 10/27/22 which unfortunately demonstrated evidence of new/recurrent lymphoma involving the frontal lobes, corpus callosum, internal capsules, and middle cerebellar peduncles.   She then presented to the ED on 10/29/22 with progressive instability, nausea, and vomiting secondary to disease progression. Oncology was consulted and she was admitted for further management and started on Decadron (4 mg).   Pertinent imaging performed while inpatient thus far includes:  -- MRI of the thoracic spine with and without contrast on 10/30/22 showed possible CNS lymphoma changes, characterized by patchy intermittent T2 signal abnormality involving the thoracic cord, extending from  approximately T4-5 through T11-12, and associated heterogeneous enhancement from T6-7 through T10-11. No other acute abnormalities were seen within the thoracic spine  -- MRI of the lumbar spine and cervical on 10/29/22 showed no evidence of metastatic disease in the lumbar or cervical spine. (Chronic findings were noted in both the cervical and lumbar spine including foraminal stenosis and degenerative changes).   Patient states her pain is well controlled at bedside today. She has been experiencing mid-back pain for approximately 2 weeks. She states that her fall was due to imbalance and feeling "woozy headed". She denies any other neurologic concerns today.    Current/Most Recent Therapy:        Imbruvica/Rituxan -- started 05/02/2022, s/p cycle #5 -DC on 07/16/2022  PREVIOUS RADIATION THERAPY: No  PAST MEDICAL HISTORY:  has a past medical history of Allergy, Arthritis, Cataract, Chicken pox, Complication of anesthesia, Goals of care, counseling/discussion (12/22/2020), High cholesterol, High grade B-cell lymphoma (HCC) (12/22/2020), Hyperglycemia (11/10/2020), Melanoma (HCC) (2021), Melanoma (HCC) (10/28/2020), PONV (postoperative nausea and vomiting), Stroke (HCC), and Urinary incontinence.    PAST SURGICAL HISTORY: Past Surgical History:  Procedure Laterality Date   AIR/FLUID EXCHANGE Right 12/07/2020   Procedure: AIR/FLUID EXCHANGE;  Surgeon: Carmela Rima, MD;  Location: [facility] OR;  Service: Ophthalmology;  Laterality: Right;   APPENDECTOMY  1962   BIOPSY  12/14/2020   Procedure: BIOPSY;  Surgeon: Meridee Score Netty Starring., MD;  Location: [facility] ENDOSCOPY;  Service: Gastroenterology;;   ESOPHAGOGASTRODUODENOSCOPY (EGD) WITH PROPOFOL N/A 12/14/2020   Procedure: ESOPHAGOGASTRODUODENOSCOPY (EGD) WITH PROPOFOL;  Surgeon: Lemar Lofty., MD;  Location: [facility] ENDOSCOPY;  Service: Gastroenterology;  Laterality: N/A;   EXCISION MELANOMA WITH SENTINEL LYMPH  NODE BIOPSY Right 10/09/2019    Procedure: WIDE LOCAL EXCISION WITH ADVANCEMENT FLAP CLOSURE RIGHT HIP MELANOMA WITH SENTINEL LYMPH NODE BIOPSY;  Surgeon: Almond Lint, MD;  Location: [facility] OR;  Service: General;  Laterality: Right;   EYE SURGERY     FINE NEEDLE ASPIRATION  12/14/2020   Procedure: FINE NEEDLE ASPIRATION (FNA) LINEAR;  Surgeon: Lemar Lofty., MD;  Location: [facility] ENDOSCOPY;  Service: Gastroenterology;;   HERNIA REPAIR  2009   IR BONE MARROW BIOPSY & ASPIRATION  05/08/2022   IR IMAGING GUIDED PORT INSERTION  12/30/2020   JOINT REPLACEMENT  2023   MOUTH SURGERY  11/2015   gum surgery and bone implant   PARS PLANA VITRECTOMY Right 12/07/2020   Procedure: PARS PLANA VITRECTOMY WITH 25 GAUGE;  Surgeon: Carmela Rima, MD;  Location: [facility] OR;  Service: Ophthalmology;  Laterality: Right;   PHOTOCOAGULATION WITH LASER Right 12/07/2020   Procedure: PHOTOCOAGULATION WITH ENDOLASER PANRITINAL COAGULATION;  Surgeon: Carmela Rima, MD;  Location: [facility] OR;  Service: Ophthalmology;  Laterality: Right;   TOTAL HIP ARTHROPLASTY Left 10/11/2021   Procedure: LEFT TOTAL HIP ARTHROPLASTY ANTERIOR APPROACH;  Surgeon: Marcene Corning, MD;  Location: [facility] ORS;  Service: Orthopedics;  Laterality: Left;   TOTAL HIP ARTHROPLASTY Right 01/10/2022   Procedure: RIGHT TOTAL HIP ARTHROPLASTY ANTERIOR APPROACH;  Surgeon: Marcene Corning, MD;  Location: [facility] ORS;  Service: Orthopedics;  Laterality: Right;   UPPER ESOPHAGEAL ENDOSCOPIC ULTRASOUND (EUS) N/A 12/14/2020   Procedure: UPPER ESOPHAGEAL ENDOSCOPIC ULTRASOUND (EUS);  Surgeon: Lemar Lofty., MD;  Location: [facility] ENDOSCOPY;  Service: Gastroenterology;  Laterality: N/A;   WISDOM TOOTH EXTRACTION      FAMILY HISTORY: family history includes Alcohol abuse in her father; COPD in her father; Cancer in her sister; Coronary artery disease in her father and sister; Diabetes Mellitus II in her father; Heart attack in her brother; Hyperlipidemia in some other family members; Hypertension  in an other family member.  SOCIAL HISTORY:  reports that she has never smoked. She has never used smokeless tobacco. She reports that she does not drink alcohol and does not use drugs.  ALLERGIES: Decadron [dexamethasone], Plavix [clopidogrel], Prednisone, Sulfa antibiotics, and Codeine  MEDICATIONS:  No current facility-administered medications for this encounter.   No current outpatient medications on file.   Facility-Administered Medications Ordered in Other Encounters  Medication Dose Route Frequency Provider Last Rate Last Admin   acetaminophen (TYLENOL) tablet 650 mg  650 mg Oral Q6H PRN Therisa Doyne, MD   650 mg at 10/30/22 0211   Or   acetaminophen (TYLENOL) suppository 650 mg  650 mg Rectal Q6H PRN Doutova, Anastassia, MD       cephALEXin (KEFLEX) capsule 500 mg  500 mg Oral Q12H Girguis, David, MD       Chlorhexidine Gluconate Cloth 2 % PADS 6 each  6 each Topical Daily Ennever, Rose Phi, MD       dexamethasone (DECADRON) injection 10 mg  10 mg Intravenous Q24H [name], MD   10 mg at 10/31/22 0745   famotidine (PEPCID) tablet 40 mg  40 mg Oral BID [name], MD   40 mg at 10/31/22 1158   feeding supplement (BOOST / RESOURCE BREEZE) liquid 1 Container  1 Container Oral TID BM Therisa Doyne, MD   1 Container at 10/30/22 2032   HYDROcodone-acetaminophen (NORCO/VICODIN) 5-325 MG per tablet 1-2 tablet  1-2 tablet Oral Q4H PRN Therisa Doyne, MD       OLANZapine (ZYPREXA) tablet 10 mg  10 mg Oral QHS [name], MD       ondansetron [facility]) tablet 4 mg  4 mg Oral Q6H PRN Therisa Doyne, MD       Or   ondansetron (ZOFRAN) injection 4 mg  4 mg Intravenous Q6H PRN Doutova, Anastassia, MD       sodium bicarbonate/sodium chloride mouthwash   Mouth Rinse Q4H [name], MD   Given at 10/31/22 1200   sodium chloride flush (NS) 0.9 % injection 10 mL  10 mL Intravenous PRN [name], MD   10 mL at 10/04/21 0946   sodium chloride  flush (NS) 0.9 % injection 10-40 mL  10-40 mL Intracatheter Q12H [name], MD   10 mL at 10/31/22 1159   sodium chloride flush (NS) 0.9 % injection 10-40 mL  10-40 mL Intracatheter PRN [name], MD        REVIEW OF SYSTEMS:  Notable for that above.   PHYSICAL EXAM:  vitals were not taken for this visit.   In general this is a well appearing female in no acute distress. She's alert and oriented x4 and appropriate throughout the examination. Cardiopulmonary assessment is negative for acute distress and she exhibits normal effort.     5/5 strength in the bilateral, lower extremities. Dorsiflexion and plantar flexion intact. Sensation grossly intact.   ECOG = 4  0 - Asymptomatic (Fully active, able to carry on all predisease activities without restriction)  1 - Symptomatic but completely ambulatory (Restricted in physically strenuous activity but ambulatory and able to carry out work of a light or sedentary nature. For example, light housework, office work)  2 - Symptomatic, <50% in bed during the day (Ambulatory and capable of all self care but unable to carry out any work activities. Up and about more than 50% of waking hours)  3 - Symptomatic, >50% in bed, but not bedbound (Capable of only limited self-care, confined to bed or chair 50% or more of waking hours)  4 - Bedbound (Completely disabled. Cannot carry on any self-care. Totally confined to bed or chair)  5 - Death   Santiago Glad MM, Creech RH, Tormey DC, et al. [phone]). "Toxicity and response criteria of the [facility]". Am. Evlyn Clines. Oncol. 5 (6): 649-55   LABORATORY DATA:  Lab Results  Component Value Date   WBC 8.6 10/30/2022   HGB 14.7 10/30/2022   HCT 42.4 10/30/2022   MCV 93.2 10/30/2022   PLT 175 10/30/2022   CMP     Component Value Date/Time   NA 133 (L) 10/30/2022 1150   K 3.7 10/30/2022 1150   CL 100 10/30/2022 1150   CO2 22 10/30/2022 1150   GLUCOSE 197 (H) 10/30/2022 1150    BUN 21 10/30/2022 1150   CREATININE 0.73 10/30/2022 1150   CREATININE 0.66 10/26/2022 1154   CREATININE 0.68 11/18/2012 1439   CALCIUM 9.4 10/30/2022 1150   PROT 6.8 10/30/2022 0516   ALBUMIN 4.1 10/30/2022 0516   AST 20 10/30/2022 0516   AST 16 10/26/2022 1154   ALT 33 10/30/2022 0516   ALT 22 10/26/2022 1154   ALKPHOS 50 10/30/2022 0516   BILITOT 0.6 10/30/2022 0516   BILITOT 0.5 10/26/2022 1154   GFR 91.98 12/19/2021 1027   GFRNONAA >60 10/30/2022 1150   GFRNONAA >60 10/26/2022 1154   GFRNONAA >89 11/18/2012 1439         RADIOGRAPHY: MR Lumbar Spine W Wo Contrast  Result Date:  10/30/2022 CLINICAL DATA:  Initial evaluation for hematologic malignancy, recurrent NHL. EXAM: MRI LUMBAR SPINE WITHOUT AND WITH CONTRAST TECHNIQUE: Multiplanar and multiecho pulse sequences of the lumbar spine were obtained without and with intravenous contrast. CONTRAST:  6mL GADAVIST GADOBUTROL 1 MMOL/ML IV SOLN COMPARISON:  Comparison made with prior PET-CT from 09/01/2022. FINDINGS: Segmentation: Standard. Lowest well-formed disc space labeled the L5-S1 level. Alignment: 2 mm facet mediated anterolisthesis of L4 on L5. Alignment otherwise normal preservation of the normal lumbar lordosis. Vertebrae: Vertebral body height maintained without acute or chronic fracture. Bone marrow signal intensity within normal limits. 1.9 cm benign hemangioma noted within the L3 vertebral body. No worrisome osseous lesions. Mild reactive edema present about the bilateral L4-5 facets due to facet arthritis. No other abnormal marrow edema or enhancement. Conus medullaris and cauda equina: Conus extends to the L1 level. Conus and cauda equina appear normal. No abnormal enhancement. Paraspinal and other soft tissues: Unremarkable. Disc levels: L1-2: Tiny central disc protrusion minimally indents the ventral thecal sac. No canal or foraminal stenosis. L2-3:  Normal interspace.  Mild facet hypertrophy.  No stenosis. L3-4: Mild far  lateral disc bulging. Mild facet hypertrophy. No stenosis. L4-5: Trace anterolisthesis. No significant disc bulge. Moderate bilateral facet arthrosis with mild edema and small joint effusions. No significant spinal stenosis. Foramina remain adequately patent. L5-S1: Mild disc bulge. Mild to moderate bilateral facet arthrosis. No significant spinal stenosis. Foramina remain patent. IMPRESSION: 1. No evidence for metastatic disease or malignancy within the lumbar spine. 2. Moderate bilateral facet arthrosis at L4-5 with mild reactive edema. Finding could serve as a source for lower back pain. 3. Minor for age noncompressive disc bulging at L1-2 through L5-S1 without significant stenosis or impingement. Electronically Signed   By: Rise Mu M.D.   On: 10/30/2022 19:45   MR THORACIC SPINE W WO CONTRAST  Result Date: 10/30/2022 CLINICAL DATA:  Initial evaluation for hematologic malignancy, recurrent NHL. EXAM: MRI THORACIC WITHOUT AND WITH CONTRAST TECHNIQUE: Multiplanar and multiecho pulse sequences of the thoracic spine were obtained without and with intravenous contrast. CONTRAST:  6mL GADAVIST GADOBUTROL 1 MMOL/ML IV SOLN COMPARISON:  Comparison made with prior PET-CT from 09/01/2022. FINDINGS: Alignment: Physiologic with preservation of the normal thoracic kyphosis. No listhesis. Vertebrae: Vertebral body height maintained without acute or chronic fracture. Bone marrow signal intensity within normal limits. No discrete or worrisome osseous lesions or abnormal marrow edema. Cord: Patchy intermittent T2 signal abnormality seen involving the thoracic cord, extending from approximately T4-5 through T11-12. Changes are most pronounced within the lower thoracic cord at the level of T10-11 (series 29, image 27). Cord is somewhat expanded at these levels. Following contrast administration. Patchy heterogeneous postcontrast enhancement is seen from T6-7 through T10-11 (series 27, image 10). Finding presumably  reflects changes of CNS lymphoma given findings on corresponding brain MRI. Paraspinal and other soft tissues: Unremarkable. Disc levels: No significant disc pathology within the thoracic spine for age. No disc bulge or focal disc herniation. No significant stenosis or impingement. IMPRESSION: 1. Patchy intermittent T2 signal abnormality involving the thoracic cord, extending from approximately T4-5 through T11-12. Associated heterogeneous enhancement from T6-7 through T10-11. Finding presumably reflects changes of CNS lymphoma given findings on recent brain MRI. 2. No other acute abnormality within the thoracic spine. No significant disc pathology or stenosis. Electronically Signed   By: Rise Mu M.D.   On: 10/30/2022 19:38   MR CERVICAL SPINE W WO CONTRAST  Result Date: 10/30/2022 CLINICAL DATA:  Initial evaluation for hematologic  malignancy. Recurrent in HL. EXAM: MRI CERVICAL SPINE WITHOUT AND WITH CONTRAST TECHNIQUE: Multiplanar and multiecho pulse sequences of the cervical spine, to include the craniocervical junction and cervicothoracic junction, were obtained without and with intravenous contrast. CONTRAST:  6mL GADAVIST GADOBUTROL 1 MMOL/ML IV SOLN COMPARISON:  Brain MRI from 10/27/2022. FINDINGS: Alignment: Straightening with mild reversal of the normal cervical lordosis. No significant listhesis. Vertebrae: Vertebral body height maintained without acute or chronic fracture. Bone marrow signal intensity within normal limits. No worrisome osseous lesions. Mild reactive marrow edema and enhancement about the left C7-T1 facet due to facet arthritis. Cord: Normal signal and morphology.  No abnormal enhancement. Posterior Fossa, vertebral arteries, paraspinal tissues: Unremarkable. Disc levels: C2-C3: Negative interspace. Mild right-sided facet hypertrophy. No canal or foraminal stenosis. C3-C4: Left-sided uncovertebral spurring without significant disc bulge. Moderate left with mild right facet  hypertrophy. No significant spinal stenosis. Mild left C4 foraminal narrowing. Right neural foramen remains patent. C4-C5: Mild disc bulge with uncovertebral spurring. Moderate right with mild left facet hypertrophy. No spinal stenosis. Mild right C5 foraminal narrowing. Left neural foramen is patent. C5-C6: Degenerate intervertebral disc space narrowing with diffuse disc osteophyte complex. Broad left paracentral posterior component flattens and indents the ventral thecal sac (series 9, image 23). Mild cord flattening without cord signal changes. Mild spinal stenosis. Moderate right C6 foraminal narrowing. Left neural foramen is patent. C6-C7: Mild uncovertebral spurring without significant disc bulge. No canal or foraminal stenosis. C7-T1: Negative interspace. Moderate left with mild right facet arthrosis. No spinal stenosis. Foramina remain adequately patent. IMPRESSION: 1. No evidence for metastatic disease or malignancy within the cervical spine or spinal cord. 2. Degenerative disc osteophyte complex at C5-6 with resultant mild spinal stenosis, with moderate right C6 foraminal narrowing. 3. Mild left C4 and right C5 foraminal stenosis related to uncovertebral and facet disease. 4. Moderate left-sided facet hypertrophy at C7-T1 with associated reactive marrow edema. Finding could serve as a source for neck pain. Electronically Signed   By: Rise Mu M.D.   On: 10/30/2022 19:31   MR Brain W Wo Contrast  Result Date: 10/27/2022 CLINICAL DATA:  Brain/CNS neoplasm, monitor. Severe fatigue, nausea. History of CNS lymphoma. EXAM: MRI HEAD WITHOUT AND WITH CONTRAST TECHNIQUE: Multiplanar, multiecho pulse sequences of the brain and surrounding structures were obtained without and with intravenous contrast. CONTRAST:  6mL GADAVIST GADOBUTROL 1 MMOL/ML IV SOLN COMPARISON:  Head MRI 09/05/2022 FINDINGS: Brain: There is new confluent T2 hyperintensity and heterogeneous enhancement in the anteromedial aspects  of both frontal lobes extending across the genu of the corpus callosum which is expanded. Similar but smaller regions of new T2 hyperintensity and enhancement are present in the posterior limbs of the left greater than right internal capsules extending into the cerebral peduncles as well as in the right middle cerebellar peduncle. There is also mildly increased T2 hyperintensity in the left middle cerebellar peduncle compared to the prior study with new mild enhancement. There is no significant mass effect associated with any of these lesions. Encephalomalacia and associated chronic blood products in the lateral left frontal lobe, posterior left parietal lobe, and posterior left temporal lobe are unchanged. No acute infarct or extra-axial fluid collection is evident. There is mild cerebral atrophy. Vascular: Major intracranial vascular flow voids are preserved. Skull and upper cervical spine: No suspicious marrow lesion. Sinuses/Orbits: Bilateral cataract extraction. Paranasal sinuses and mastoid air cells are clear. Other: None. IMPRESSION: Evidence of new/recurrent lymphoma involving the frontal lobes, corpus callosum, internal capsules, and middle cerebellar  peduncles. Electronically Signed   By: Sebastian Ache M.D.   On: 10/27/2022 10:16      IMPRESSION/PLAN:Diffuse large cell non-Hodgkin's lymphoma-CNS involvement - STAGE IV. Now with evidence of new/recurrent lymphoma involving the frontal lobes, corpus callosum, internal capsules, middle cerebellar peduncles, and thoracic spine. (August/Sep 2024)   It was a pleasure seeing this patient again today. Recent imaging unfortunately shows diffuse recurrent lymphoma in her brain and T-spine. We have personally reviewed her imaging. Dr. Barbaraann Cao has recommended Methotrexate therapy for treatment of her disease. Fortunately, she is not currently experiencing any significant neurologic deficits from her disease, so radiation is not necessary at this time. Patient  expressed understanding of this plan and is in agreement. She knows to contact our office with any questions or concerns. We appreciate the consult and are happy to reassess in the future if needed.    On date of service, in total, I spent 30 minutes on this encounter. Patient was seen in person.   __________________________________________   Joyice Faster, PA-C   Lonie Peak, MD  This document serves as a record of services personally performed by Lonie Peak, MD. It was created on her behalf by Neena Rhymes, a trained medical scribe. The creation of this record is based on the scribe's personal observations and the provider's statements to them. This document has been checked and approved by the attending provider.

## 2022-10-31 NOTE — TOC Progression Note (Signed)
Transition of Care [facility]) - Progression Note    Patient Details  Name: Ariel [name] MRN: [id number] Date of Birth: Jan 01, 1956  Transition of Care [facility]) CM/SW Contact  Howell Rucks, RN Phone Number: 10/31/2022, 2:34 PM  Clinical Narrative:  Teams chat sent to attending to enter [facility] PT/OT order.     Expected Discharge Plan: Home w Home Health Services Barriers to Discharge: Continued Medical Work up  Expected Discharge Plan and Services       Living arrangements for the past 2 months: Independent Living Facility                           HH Arranged: PT ([facility], has contract with [employer])           Social Determinants of Health (SDOH) Interventions SDOH Screenings   Food Insecurity: No Food Insecurity (10/30/2022)  Housing: Low Risk  (10/30/2022)  Transportation Needs: No Transportation Needs (10/30/2022)  Utilities: Not At Risk (10/30/2022)  Alcohol Screen: Low Risk  (09/05/2022)  Depression (PHQ2-9): Low Risk  (09/06/2022)  Financial Resource Strain: Low Risk  (09/05/2022)  Physical Activity: Sufficiently Active (09/05/2022)  Social Connections: Moderately Isolated (09/05/2022)  Stress: No Stress Concern Present (09/05/2022)  Tobacco Use: Low Risk  (10/29/2022)    Readmission Risk Interventions    10/30/2022    1:29 PM 04/14/2022   10:59 AM 03/27/2022    3:00 PM  Readmission Risk Prevention Plan  Transportation Screening Complete Complete Complete  PCP or Specialist Appt within 5-7 Days Complete    Home Care Screening Complete    Medication Review (RN CM) Complete    Medication Review [facility])  Complete Complete  PCP or Specialist appointment within 3-5 days of discharge  Complete Complete  HRI or Home Care Consult  Complete Complete  SW Recovery Care/Counseling Consult   Complete  Palliative Care Screening   Complete  Skilled Nursing Facility   Complete

## 2022-10-31 NOTE — Progress Notes (Signed)
Ms. Razavi says she feels better.  I am sure the steroids have some to do with this.  She may have a little bit of agitation from the steroids.  Again she is on low-dose steroids..  She did have the MRI of the total spine yesterday.  Looks like there is involvement of the thoracic spine by lymphoma.  She has some pain in the right hip.  I am not sure if there is any lymphoma in this area or not.  Again, as far as treating this, it is still not clear as to what the best way to go is.  She has not had whole brain irradiation.  She will need whole brain and spinal radiation.  I think that would be quite a bit of radiation.  I would have to talk to Radiation Oncology to see what they would have to say.  There are no labs back yet today.  She says her appetite is not all that great.  She has had no fever.  She has had no incontinence.  There is no headache right now.  There is no bleeding.  I think she is out of bed to a chair.  Her vital signs show temperature 98.1.  Pulse 64.  Blood pressure 121/71.  Her head and neck exam shows no ocular or oral lesions.  There is no thrush in the oral cavity.  There is no adenopathy in the neck.  Lungs are clear bilaterally.  Cardiac exam regular rate and rhythm.  She has no murmurs.  Abdomen is soft.  Bowel sounds are present.  There is no guarding or rebound tenderness.  Extremity shows no clubbing, cyanosis or edema.  She has a little bit of tenderness to palpation over the right hip area.  Skin exam shows no rashes.  Neurological exam shows no focal neurological deficits.  I suspect we may have to get a lumbar puncture on her to see if there is lymphoma in the spinal fluid.  I will have to talk to her about this.  I would continue her on the steroids.  I know that she is getting fantastic care from everybody down on 4 E.  I do appreciate everybody's efforts.  Christin Bach, MD  Hebrews 12:12

## 2022-10-31 NOTE — Progress Notes (Signed)
OT Cancellation Note  Patient Details Name: Ariel [name] MRN: [id number] DOB: 1955/06/15   Cancelled Treatment:    Reason Eval/Treat Not Completed: Patient declined, no reason specified Patient refused reporting  lunch was coming soon and that she needed to eat first. OT to continue to follow and check back as schedule will allow. Rosalio Loud, MS Acute Rehabilitation Department Office# [phone]  10/31/2022, 11:37 AM

## 2022-10-31 NOTE — Plan of Care (Signed)
  Problem: Education: Goal: Knowledge of General Education information will improve Description: Including pain rating scale, medication(s)/side effects and non-pharmacologic comfort measures Outcome: Progressing   Problem: Activity: Goal: Risk for activity intolerance will decrease Outcome: Progressing   Problem: Elimination: Goal: Will not experience complications related to bowel motility Outcome: Progressing Goal: Will not experience complications related to urinary retention Outcome: Progressing   

## 2022-11-01 ENCOUNTER — Encounter (HOSPITAL_COMMUNITY): Payer: Self-pay | Admitting: Hematology & Oncology

## 2022-11-01 DIAGNOSIS — C8589 Other specified types of non-Hodgkin lymphoma, extranodal and solid organ sites: Secondary | ICD-10-CM | POA: Diagnosis not present

## 2022-11-01 LAB — CBC WITH DIFFERENTIAL/PLATELET
Abs Immature Granulocytes: 0.13 10*3/uL — ABNORMAL HIGH (ref 0.00–0.07)
Basophils Absolute: 0 10*3/uL (ref 0.0–0.1)
Basophils Relative: 0 %
Eosinophils Absolute: 0 10*3/uL (ref 0.0–0.5)
Eosinophils Relative: 0 %
HCT: 38.4 % (ref 36.0–46.0)
Hemoglobin: 13.4 g/dL (ref 12.0–15.0)
Immature Granulocytes: 1 %
Lymphocytes Relative: 14 %
Lymphs Abs: 1.3 10*3/uL (ref 0.7–4.0)
MCH: 32.1 pg (ref 26.0–34.0)
MCHC: 34.9 g/dL (ref 30.0–36.0)
MCV: 92.1 fL (ref 80.0–100.0)
Monocytes Absolute: 0.7 10*3/uL (ref 0.1–1.0)
Monocytes Relative: 8 %
Neutro Abs: 7.1 10*3/uL (ref 1.7–7.7)
Neutrophils Relative %: 77 %
Platelets: 182 10*3/uL (ref 150–400)
RBC: 4.17 MIL/uL (ref 3.87–5.11)
RDW: 12.1 % (ref 11.5–15.5)
WBC: 9.4 10*3/uL (ref 4.0–10.5)
nRBC: 0 % (ref 0.0–0.2)

## 2022-11-01 LAB — URINALYSIS, DIPSTICK ONLY
Bilirubin Urine: NEGATIVE
Glucose, UA: 50 mg/dL — AB
Hgb urine dipstick: NEGATIVE
Ketones, ur: NEGATIVE mg/dL
Leukocytes,Ua: NEGATIVE
Nitrite: NEGATIVE
Protein, ur: NEGATIVE mg/dL
Specific Gravity, Urine: 1.011 (ref 1.005–1.030)
pH: 6 (ref 5.0–8.0)

## 2022-11-01 LAB — COMPREHENSIVE METABOLIC PANEL
ALT: 65 U/L — ABNORMAL HIGH (ref 0–44)
AST: 23 U/L (ref 15–41)
Albumin: 3.7 g/dL (ref 3.5–5.0)
Alkaline Phosphatase: 46 U/L (ref 38–126)
Anion gap: 9 (ref 5–15)
BUN: 18 mg/dL (ref 8–23)
CO2: 23 mmol/L (ref 22–32)
Calcium: 8.9 mg/dL (ref 8.9–10.3)
Chloride: 99 mmol/L (ref 98–111)
Creatinine, Ser: 0.62 mg/dL (ref 0.44–1.00)
GFR, Estimated: >60 mL/min (ref >60)
Glucose, Bld: 105 mg/dL — ABNORMAL HIGH (ref 70–99)
Potassium: 3.4 mmol/L — ABNORMAL LOW (ref 3.5–5.1)
Sodium: 131 mmol/L — ABNORMAL LOW (ref 135–145)
Total Bilirubin: 0.6 mg/dL (ref 0.3–1.2)
Total Protein: 6.1 g/dL — ABNORMAL LOW (ref 6.5–8.1)

## 2022-11-01 LAB — LACTATE DEHYDROGENASE: LDH: 117 U/L (ref 98–192)

## 2022-11-01 MED ORDER — POLYETHYLENE GLYCOL 3350 17 G PO PACK
17.0000 g | PACK | Freq: Two times a day (BID) | ORAL | Status: DC
  Administered 2022-11-01 – 2022-11-08 (×13): 17 g via ORAL
  Filled 2022-11-01 (×14): qty 1

## 2022-11-01 MED ORDER — SODIUM BICARBONATE 8.4 % IV SOLN
INTRAVENOUS | Status: DC
  Filled 2022-11-01 (×3): qty 150
  Filled 2022-11-01: qty 1000
  Filled 2022-11-01: qty 150
  Filled 2022-11-01: qty 1000
  Filled 2022-11-01 (×3): qty 150
  Filled 2022-11-01 (×3): qty 1000

## 2022-11-01 MED ORDER — SODIUM BICARBONATE 8.4 % IV SOLN
Freq: Once | INTRAVENOUS | Status: AC
  Filled 2022-11-01: qty 150

## 2022-11-01 MED ORDER — LACTULOSE 10 GM/15ML PO SOLN
20.0000 g | Freq: Once | ORAL | Status: AC
  Administered 2022-11-01: 20 g via ORAL
  Filled 2022-11-01: qty 30

## 2022-11-01 MED ORDER — CARMEX CLASSIC LIP BALM EX OINT
TOPICAL_OINTMENT | CUTANEOUS | Status: DC | PRN
  Filled 2022-11-01: qty 10

## 2022-11-01 NOTE — Progress Notes (Signed)
PROGRESS NOTE    Ariel [name]  MWN:[id number] DOB: 23-Jan-1956 DOA: 10/29/2022 PCP: Sandford Craze, NP  Brief Narrative: 67 year old female with a history of B-cell lymphoma admitted with generalized weakness and ataxia.  MRI of the brain showed new or recurrent lymphoma involving the cerebral and cerebellar areas dated 10/27/2022.  She started on steroids.  MRI of the cervical thoracic and lumbar spine done shows suspicious area in the thoracic spine concerning for mets.  The C-spine and the lumbar spine does not show evidence of mets.  Patchy intermittent T2 signal abnormality involving the thoracic cord, extending from approximately T4-5 through T11-12. Associated heterogeneous enhancement from T6-7 through T10-11. Finding presumably reflects changes of CNS lymphoma given findings on recent brain MRI.  MRI brain evidence of recurrent or new lymphoma involving the frontal lobes, corpus callosum, internal capsules, middle cerebellar peduncles.  Assessment & Plan:   Principal Problem:   CNS lymphoma (HCC) Active Problems:   High grade B-cell lymphoma (HCC)   UTI (urinary tract infection)   SIADH (syndrome of inappropriate ADH production) (HCC)   Protein-calorie malnutrition, moderate (HCC)   Ataxia  #1 high-grade B-cell lymphoma now with CNS involvement and T-spine involvement-Dr. Ennever's notes from today reviewed. Patient will be moved to 6 E. to start high-dose methotrexate and Rituxan. On Decadron 10 mg daily.  #2 urinary tract infection on Keflex urine culture from 10/26/2022 with a E. Coli  #3 protein calorie malnutrition on Zyprexa hopefully to increase appetite   #4 SIADH sodium stable at 131  Nutrition Problem: Moderate Malnutrition Etiology: chronic illness, cancer and cancer related treatments   Signs/Symptoms: moderate fat depletion, moderate muscle depletion, energy intake < 75% for > 7 days    Interventions: Boost Breeze, Ensure Enlive (each supplement  provides 350kcal and 20 grams of protein), MVI  Estimated body mass index is 23.65 kg/m as calculated from the following:   Height as of this encounter: 5\' 7"  (1.702 m).   Weight as of this encounter: 68.5 kg.  DVT prophylaxis: SCD  code Status: Full code  family Communication: None  disposition Plan:  Status is: Inpatient Remains inpatient appropriate because: New CNS lymphoma treatment in progress Consultants:  Oncology  Procedures: None Antimicrobials: None  Subjective: Resting in bed appears weak depressed low tone I told her that she is being moved to 6 E. she is agreeable she lives at [facility]  Objective: Vitals:   10/31/22 1155 10/31/22 1443 10/31/22 2005 11/01/22 0624  BP: 126/67 (!) 110/58 (!) 113/55 121/61  Pulse: (!) 59 (!) 58 63 61  Resp: 19 17 19 19   Temp: 97.8 F (36.6 C) 97.8 F (36.6 C) 97.8 F (36.6 C) 97.6 F (36.4 C)  TempSrc: Bladder Oral Oral Oral  SpO2: 96% 98% 98% 99%  Weight:      Height:        Intake/Output Summary (Last 24 hours) at 11/01/2022 0912 Last data filed at 11/01/2022 0500 Gross per 24 hour  Intake 360 ml  Output 700 ml  Net -340 ml   Filed Weights   10/29/22 2011  Weight: 68.5 kg    Examination:  General exam: Appears in no acute distress, frail Respiratory system: Clear to auscultation. Respiratory effort normal. Cardiovascular system: S1 & S2 heard, RRR. No JVD, murmurs, rubs, gallops or clicks. No pedal edema. Gastrointestinal system: Abdomen is nondistended, soft and nontender. No organomegaly or masses felt. Normal bowel sounds heard. Central nervous system: Alert and oriented.  Extremities: No edema   Data  Reviewed: I have personally reviewed following labs and imaging studies  CBC: Recent Labs  Lab 10/26/22 1154 10/29/22 2022 10/30/22 0516 11/01/22 0145  WBC 5.8 5.7 8.6 9.4  NEUTROABS 3.5 4.1  --  7.1  HGB 13.8 14.3 14.7 13.4  HCT 39.7 40.8 42.4 38.4  MCV 91.5 91.7 93.2 92.1  PLT 175 180 175 182    Basic Metabolic Panel: Recent Labs  Lab 10/26/22 1154 10/29/22 2022 10/30/22 0129 10/30/22 0516 10/30/22 1150 11/01/22 0145  NA 131* 129*  --  130* 133* 131*  K 3.8 3.8  --  3.9 3.7 3.4*  CL 94* 95*  --  97* 100 99  CO2 29 25  --  23 22 23   GLUCOSE 119* 96  --  119* 197* 105*  BUN 22 22  --  21 21 18   CREATININE 0.66 0.82  --  0.73 0.73 0.62  CALCIUM 9.9 9.5  --  9.3 9.4 8.9  MG  --   --  2.3 2.4  --   --   PHOS  --   --  3.1 3.5  --   --    GFR: Estimated Creatinine Clearance: 65.5 mL/min (by C-G formula based on SCr of 0.62 mg/dL). Liver Function Tests: Recent Labs  Lab 10/26/22 1154 10/29/22 2022 10/30/22 0129 10/30/22 0516 11/01/22 0145  AST 16 19 18 20 23   ALT 22 32 28 33 65*  ALKPHOS 51 53 48 50 46  BILITOT 0.5 0.9 0.6 0.6 0.6  PROT 6.8 6.9 6.6 6.8 6.1*  ALBUMIN 4.6 4.3 4.0 4.1 3.7   No results for input(s): "LIPASE", "AMYLASE" in the last 168 hours. No results for input(s): "AMMONIA" in the last 168 hours. Coagulation Profile: No results for input(s): "INR", "PROTIME" in the last 168 hours. Cardiac Enzymes: Recent Labs  Lab 10/30/22 0129  CKTOTAL 41   BNP (last 3 results) No results for input(s): "PROBNP" in the last 8760 hours. HbA1C: No results for input(s): "HGBA1C" in the last 72 hours. CBG: Recent Labs  Lab 10/29/22 1938  GLUCAP 103*   Lipid Profile: No results for input(s): "CHOL", "HDL", "LDLCALC", "TRIG", "CHOLHDL", "LDLDIRECT" in the last 72 hours. Thyroid Function Tests: Recent Labs    10/30/22 0129  TSH 0.714   Anemia Panel: No results for input(s): "VITAMINB12", "FOLATE", "FERRITIN", "TIBC", "IRON", "RETICCTPCT" in the last 72 hours. Sepsis Labs: Recent Labs  Lab 10/30/22 0129  PROCALCITON <0.10    Recent Results (from the past 240 hour(s))  Culture, Urine     Status: Abnormal   Collection Time: 10/26/22 12:56 PM   Specimen: Urine, Clean Catch  Result Value Ref Range Status   Specimen Description   Final    URINE,  CLEAN CATCH Performed at [facility] at [facility], [address], [place], [place] [address]    Special Requests   Final    URINE, CLEAN CATCH Performed at [facility] at [facility], [address] [place], [place] [address]    Culture >=100,000 COLONIES/mL ESCHERICHIA COLI (A)  Final   Report Status 10/28/2022 FINAL  Final   Organism ID, Bacteria ESCHERICHIA COLI (A)  Final      Susceptibility   Escherichia coli - MIC*    AMPICILLIN >=32 RESISTANT Resistant     CEFAZOLIN <=4 SENSITIVE Sensitive     CEFEPIME <=0.12 SENSITIVE Sensitive     CEFTRIAXONE <=0.25 SENSITIVE Sensitive     CIPROFLOXACIN <=0.25  SENSITIVE Sensitive     GENTAMICIN >=16 RESISTANT Resistant     IMIPENEM <=0.25 SENSITIVE Sensitive     NITROFURANTOIN <=16 SENSITIVE Sensitive     TRIMETH/SULFA >=320 RESISTANT Resistant     AMPICILLIN/SULBACTAM 16 INTERMEDIATE Intermediate     PIP/TAZO <=4 SENSITIVE Sensitive     * >=100,000 COLONIES/mL ESCHERICHIA COLI         Radiology Studies: MR Lumbar Spine W Wo Contrast  Result Date: 10/30/2022 CLINICAL DATA:  Initial evaluation for hematologic malignancy, recurrent NHL. EXAM: MRI LUMBAR SPINE WITHOUT AND WITH CONTRAST TECHNIQUE: Multiplanar and multiecho pulse sequences of the lumbar spine were obtained without and with intravenous contrast. CONTRAST:  6mL GADAVIST GADOBUTROL 1 MMOL/ML IV SOLN COMPARISON:  Comparison made with prior PET-CT from 09/01/2022. FINDINGS: Segmentation: Standard. Lowest well-formed disc space labeled the L5-S1 level. Alignment: 2 mm facet mediated anterolisthesis of L4 on L5. Alignment otherwise normal preservation of the normal lumbar lordosis. Vertebrae: Vertebral body height maintained without acute or chronic fracture. Bone marrow signal intensity within normal limits. 1.9 cm benign hemangioma noted within the L3 vertebral body. No worrisome osseous lesions. Mild reactive edema present  about the bilateral L4-5 facets due to facet arthritis. No other abnormal marrow edema or enhancement. Conus medullaris and cauda equina: Conus extends to the L1 level. Conus and cauda equina appear normal. No abnormal enhancement. Paraspinal and other soft tissues: Unremarkable. Disc levels: L1-2: Tiny central disc protrusion minimally indents the ventral thecal sac. No canal or foraminal stenosis. L2-3:  Normal interspace.  Mild facet hypertrophy.  No stenosis. L3-4: Mild far lateral disc bulging. Mild facet hypertrophy. No stenosis. L4-5: Trace anterolisthesis. No significant disc bulge. Moderate bilateral facet arthrosis with mild edema and small joint effusions. No significant spinal stenosis. Foramina remain adequately patent. L5-S1: Mild disc bulge. Mild to moderate bilateral facet arthrosis. No significant spinal stenosis. Foramina remain patent. IMPRESSION: 1. No evidence for metastatic disease or malignancy within the lumbar spine. 2. Moderate bilateral facet arthrosis at L4-5 with mild reactive edema. Finding could serve as a source for lower back pain. 3. Minor for age noncompressive disc bulging at L1-2 through L5-S1 without significant stenosis or impingement. Electronically Signed   By: Rise Mu M.D.   On: 10/30/2022 19:45   MR THORACIC SPINE W WO CONTRAST  Result Date: 10/30/2022 CLINICAL DATA:  Initial evaluation for hematologic malignancy, recurrent NHL. EXAM: MRI THORACIC WITHOUT AND WITH CONTRAST TECHNIQUE: Multiplanar and multiecho pulse sequences of the thoracic spine were obtained without and with intravenous contrast. CONTRAST:  6mL GADAVIST GADOBUTROL 1 MMOL/ML IV SOLN COMPARISON:  Comparison made with prior PET-CT from 09/01/2022. FINDINGS: Alignment: Physiologic with preservation of the normal thoracic kyphosis. No listhesis. Vertebrae: Vertebral body height maintained without acute or chronic fracture. Bone marrow signal intensity within normal limits. No discrete or  worrisome osseous lesions or abnormal marrow edema. Cord: Patchy intermittent T2 signal abnormality seen involving the thoracic cord, extending from approximately T4-5 through T11-12. Changes are most pronounced within the lower thoracic cord at the level of T10-11 (series 29, image 27). Cord is somewhat expanded at these levels. Following contrast administration. Patchy heterogeneous postcontrast enhancement is seen from T6-7 through T10-11 (series 27, image 10). Finding presumably reflects changes of CNS lymphoma given findings on corresponding brain MRI. Paraspinal and other soft tissues: Unremarkable. Disc levels: No significant disc pathology within the thoracic spine for age. No disc bulge or focal disc herniation. No significant stenosis or impingement. IMPRESSION: 1. Patchy intermittent T2 signal abnormality  involving the thoracic cord, extending from approximately T4-5 through T11-12. Associated heterogeneous enhancement from T6-7 through T10-11. Finding presumably reflects changes of CNS lymphoma given findings on recent brain MRI. 2. No other acute abnormality within the thoracic spine. No significant disc pathology or stenosis. Electronically Signed   By: Rise Mu M.D.   On: 10/30/2022 19:38   MR CERVICAL SPINE W WO CONTRAST  Result Date: 10/30/2022 CLINICAL DATA:  Initial evaluation for hematologic malignancy. Recurrent in HL. EXAM: MRI CERVICAL SPINE WITHOUT AND WITH CONTRAST TECHNIQUE: Multiplanar and multiecho pulse sequences of the cervical spine, to include the craniocervical junction and cervicothoracic junction, were obtained without and with intravenous contrast. CONTRAST:  6mL GADAVIST GADOBUTROL 1 MMOL/ML IV SOLN COMPARISON:  Brain MRI from 10/27/2022. FINDINGS: Alignment: Straightening with mild reversal of the normal cervical lordosis. No significant listhesis. Vertebrae: Vertebral body height maintained without acute or chronic fracture. Bone marrow signal intensity within  normal limits. No worrisome osseous lesions. Mild reactive marrow edema and enhancement about the left C7-T1 facet due to facet arthritis. Cord: Normal signal and morphology.  No abnormal enhancement. Posterior Fossa, vertebral arteries, paraspinal tissues: Unremarkable. Disc levels: C2-C3: Negative interspace. Mild right-sided facet hypertrophy. No canal or foraminal stenosis. C3-C4: Left-sided uncovertebral spurring without significant disc bulge. Moderate left with mild right facet hypertrophy. No significant spinal stenosis. Mild left C4 foraminal narrowing. Right neural foramen remains patent. C4-C5: Mild disc bulge with uncovertebral spurring. Moderate right with mild left facet hypertrophy. No spinal stenosis. Mild right C5 foraminal narrowing. Left neural foramen is patent. C5-C6: Degenerate intervertebral disc space narrowing with diffuse disc osteophyte complex. Broad left paracentral posterior component flattens and indents the ventral thecal sac (series 9, image 23). Mild cord flattening without cord signal changes. Mild spinal stenosis. Moderate right C6 foraminal narrowing. Left neural foramen is patent. C6-C7: Mild uncovertebral spurring without significant disc bulge. No canal or foraminal stenosis. C7-T1: Negative interspace. Moderate left with mild right facet arthrosis. No spinal stenosis. Foramina remain adequately patent. IMPRESSION: 1. No evidence for metastatic disease or malignancy within the cervical spine or spinal cord. 2. Degenerative disc osteophyte complex at C5-6 with resultant mild spinal stenosis, with moderate right C6 foraminal narrowing. 3. Mild left C4 and right C5 foraminal stenosis related to uncovertebral and facet disease. 4. Moderate left-sided facet hypertrophy at C7-T1 with associated reactive marrow edema. Finding could serve as a source for neck pain. Electronically Signed   By: Rise Mu M.D.   On: 10/30/2022 19:31        Scheduled Meds:  cephALEXin   500 mg Oral Q12H   Chlorhexidine Gluconate Cloth  6 each Topical Daily   dexamethasone (DECADRON) injection  10 mg Intravenous Q24H   famotidine  40 mg Oral BID   feeding supplement  1 Container Oral TID BM   lactulose  20 g Oral ONCE-1800   OLANZapine  10 mg Oral QHS   polyethylene glycol  17 g Oral BID   sodium bicarbonate 150 mEq in dextrose 5 % 1,150 mL infusion   Intravenous Once   sodium bicarbonate/sodium chloride   Mouth Rinse Q4H   sodium chloride flush  10-40 mL Intracatheter Q12H   Continuous Infusions:   LOS: 3 days    Time spent: 39 min  Alwyn Ren, MD 11/01/2022, 9:12 AM

## 2022-11-01 NOTE — Progress Notes (Signed)
OT Cancellation Note  Patient Details Name: Ariel [name] MRN: [id number] DOB: 1955-07-04   Cancelled Treatment:    Reason Eval/Treat Not Completed: Patient politely declined, no reason specified. Explained the scope of occupational therapy to address ADLs and prepare pt to return to her ILF, pt continued to decline. Discharging pt as per department protocol. Please reorder OT when pt is able to participate.    Evern Bio 11/01/2022, 2:12 PM Berna Spare, OTR/L Acute Rehabilitation Services Office: [phone]

## 2022-11-01 NOTE — Progress Notes (Signed)
DISCONTINUE OFF PATHWAY REGIMEN - Lymphoma and CLL   [id number]:Rituximab IV/SUBQ D1 q7 Days:   Cycle 1: A cycle is 7 days:     Rituximab-xxxx    Cycles 2 and beyond: A cycle is every 7 days:     Rituximab and hyaluronidase human   **Always confirm dose/schedule in your pharmacy ordering system**  REASON: Other Reason PRIOR TREATMENT: Off Pathway: Rituximab IV/SUBQ D1 q7 Days TREATMENT RESPONSE: Unable to Evaluate  Lymphoma and CLL - No Medical Intervention - Off Treatment.  Patient Characteristics: Diffuse Large B-Cell Lymphoma or Follicular Lymphoma, Grade 3B, Relapsed / Refractory, All Stages,  Second Line, Relapse ? 12 Months From or Refractory to Prior Chemoimmunotherapy, Not a Candidate for CAR T-Cell Therapy, Not a Transplant Candidate Disease Type: Not Applicable Disease Type: Diffuse Large B-Cell Lymphoma Disease Type: Not Applicable Line of therapy: Relapsed / Refractory - Second Line Time to Relapse: Relapse ? 12 Months from Prior Chemoimmunotherapy Patient Characteristics: Not a Candidate for CAR T-Cell Therapy Patient Characteristics: Not a Transplant Candidate

## 2022-11-01 NOTE — Progress Notes (Signed)
So far, everything is about the same.  She still does not have much of an appetite.  Wanted to see how we can try to help with this.  She does feel little nauseated.  I did start her on some olanzapine.  Her labs show sodium 131.  Potassium 3.4.  BUN 18 creatinine 0.62.  Calcium 8.9 with an albumin of 3.7.  Her white cell count is 9.4.  Hemoglobin 13.4.  Platelet count 182,000.  I spoke with our Neuro oncologist.  He thinks that we should try her on high-dose methotrexate again.  She likely got her last dose about 8 months ago.  She has always responded to this.  Unfortunately, she does have relapses.  I will see about getting her on high-dose methotrexate.  We will have to get her urine alkalized.  We have to have her urine pH above 8 and we will going to treat her.  We will have to get her up to the 6 floor.  Hopefully, we will be able to get started on Friday.  I will have to get the orders in.  I will have to talk to our pharmacist about all of this.  She has had no problems with fever.  There is been no bleeding.  She is sitting in a chair.  Her vital signs show temperature of 97.8.  Pulse 63.  Blood pressure 113/55.  Her head and neck exam shows no ocular or oral lesions.  She has no adenopathy in the neck.  There is no mucositis.  Lungs are clear bilaterally.  Cardiac exam regular rate and rhythm.  She has no murmurs.  Abdomen is soft.  Bowel sounds are present.  There is no fluid wave.  There is no palpable liver or spleen tip.  Neurological exam shows no focal neurological deficit.  Extremities shows good movement and decent strength there lower legs bilaterally.  Again, we will see about getting her on high-dose methotrexate.  I will also use Rituxan.  Hopefully, we can move her up to 6 E. in a day or so.  I will have to talk to the staff up on 6 E. to let them know what we need to do.  I really do appreciate everybody's help with Ariel [name].  I know this is incredibly complicated.   Hopefully, we can get her back into remission so she can consider CAR-T therapy.   Ariel Bach, MD  Molli [facility]ard 4:4

## 2022-11-01 NOTE — Progress Notes (Addendum)
MEDICATION RELATED CONSULT NOTE  Pharmacy Consult for Sodium Bicarbonate infusion Indication: Urinary Alkalization for high dose methotrexate (HDMTX)  Allergies  Allergen Reactions   Decadron [Dexamethasone] Other (See Comments)    Leg weakness Hallucinations   Plavix [Clopidogrel] Hives, Swelling and Other (See Comments)    Lip swelling    Prednisone Other (See Comments)    Steroids caused psychological issues  Hallucinations   Sulfa Antibiotics Itching and Rash   Codeine Nausea And Vomiting    Patient Measurements: Height: 5\' 7"  (170.2 cm) Weight: 68.5 kg (151 lb) IBW/kg (Calculated) : 61.6  Labs: Recent Labs    10/29/22 2022 10/30/22 0129 10/30/22 0516 10/30/22 0557 10/30/22 1150 11/01/22 0145  WBC 5.7  --  8.6  --   --  9.4  HGB 14.3  --  14.7  --   --  13.4  HCT 40.8  --  42.4  --   --  38.4  PLT 180  --  175  --   --  182  CREATININE 0.82  --  0.73  --  0.73 0.62  LABCREA  --   --   --  119  --   --   MG  --  2.3 2.4  --   --   --   PHOS  --  3.1 3.5  --   --   --   ALBUMIN 4.3 4.0 4.1  --   --  3.7  PROT 6.9 6.6 6.8  --   --  6.1*  AST 19 18 20   --   --  23  ALT 32 28 33  --   --  65*  ALKPHOS 53 48 50  --   --  46  BILITOT 0.9 0.6 0.6  --   --  0.6  BILIDIR  --  0.1  --   --   --   --   IBILI  --  0.5  --   --   --   --    Estimated Creatinine Clearance: 65.5 mL/min (by C-G formula based on SCr of 0.62 mg/dL). Urinalysis    Component Value Date/Time   COLORURINE YELLOW 10/30/2022 0558   APPEARANCEUR CLEAR 10/30/2022 0558   LABSPEC 1.015 10/30/2022 0558   PHURINE 6.0 10/30/2022 0558   GLUCOSEU NEGATIVE 10/30/2022 0558   GLUCOSEU NEGATIVE 11/30/2020 1218   HGBUR SMALL (A) 10/30/2022 0558   BILIRUBINUR NEGATIVE 10/30/2022 0558   BILIRUBINUR neg 06/15/2020 1333   KETONESUR 20 (A) 10/30/2022 0558   PROTEINUR NEGATIVE 10/30/2022 0558   UROBILINOGEN 0.2 11/30/2020 1218   NITRITE NEGATIVE 10/30/2022 0558   LEUKOCYTESUR NEGATIVE 10/30/2022 0558      Medical History: Past Medical History:  Diagnosis Date   Allergy    Arthritis    Cataract    Chicken pox    Complication of anesthesia    Per patient, very slow to wake up after anesthesia   Goals of care, counseling/discussion 12/22/2020   High cholesterol    High grade B-cell lymphoma (HCC) 12/22/2020   Hyperglycemia 11/10/2020   Melanoma (HCC) 2021   right hip   Melanoma (HCC) 10/28/2020   pt stated brain liver and bladder   PONV (postoperative nausea and vomiting)    Stroke [facility])    Urinary incontinence      Assessment: Pharmacy consulted to assist with sodium bicarbonate infusion for urinary alkalization prior to initiating HDMTX therapy.    11/01/22 05:58 Urine pH 6.0 07:00 Sodium Bicarbonate ordered 150 mEq  at 100 ml/hr x 1 bag  Goal of Therapy:  Methotrexate may be administered once urine pH is ? 7  Plan:  Per Oncology provider order, continue sodium bicarbonate 150 mEq/1150 ml D5W infusion at 50 ml/hr Obtain urine pH at 20:00 this evening then daily.  Additional urine pH levels per provider. Increase the bicarbonate fluid rate or initiate concomitant oral bicarbonate if needed per provider  Pharmacy will sign off.  Thank you for allowing pharmacy to be a part of this patient's care.  Selinda Eon, PharmD, BCPS Clinical Pharmacist [place] 11/01/2022 11:38 AM

## 2022-11-02 DIAGNOSIS — C8589 Other specified types of non-Hodgkin lymphoma, extranodal and solid organ sites: Secondary | ICD-10-CM | POA: Diagnosis not present

## 2022-11-02 LAB — URINALYSIS, COMPLETE (UACMP) WITH MICROSCOPIC
Bacteria, UA: NONE SEEN
Bilirubin Urine: NEGATIVE
Glucose, UA: NEGATIVE mg/dL
Hgb urine dipstick: NEGATIVE
Ketones, ur: NEGATIVE mg/dL
Leukocytes,Ua: NEGATIVE
Nitrite: NEGATIVE
Protein, ur: NEGATIVE mg/dL
Specific Gravity, Urine: 1.013 (ref 1.005–1.030)
pH: 8 (ref 5.0–8.0)

## 2022-11-02 LAB — COMPREHENSIVE METABOLIC PANEL
ALT: 46 U/L — ABNORMAL HIGH (ref 0–44)
AST: 16 U/L (ref 15–41)
Albumin: 3.4 g/dL — ABNORMAL LOW (ref 3.5–5.0)
Alkaline Phosphatase: 46 U/L (ref 38–126)
Anion gap: 9 (ref 5–15)
BUN: 17 mg/dL (ref 8–23)
CO2: 28 mmol/L (ref 22–32)
Calcium: 9.2 mg/dL (ref 8.9–10.3)
Chloride: 97 mmol/L — ABNORMAL LOW (ref 98–111)
Creatinine, Ser: 0.6 mg/dL (ref 0.44–1.00)
GFR, Estimated: >60 mL/min (ref >60)
Glucose, Bld: 154 mg/dL — ABNORMAL HIGH (ref 70–99)
Potassium: 3.3 mmol/L — ABNORMAL LOW (ref 3.5–5.1)
Sodium: 134 mmol/L — ABNORMAL LOW (ref 135–145)
Total Bilirubin: 0.3 mg/dL (ref 0.3–1.2)
Total Protein: 5.8 g/dL — ABNORMAL LOW (ref 6.5–8.1)

## 2022-11-02 LAB — LACTATE DEHYDROGENASE: LDH: 113 U/L (ref 98–192)

## 2022-11-02 LAB — URINALYSIS, DIPSTICK ONLY
Bilirubin Urine: NEGATIVE
Glucose, UA: 50 mg/dL — AB
Hgb urine dipstick: NEGATIVE
Ketones, ur: NEGATIVE mg/dL
Leukocytes,Ua: NEGATIVE
Nitrite: NEGATIVE
Protein, ur: NEGATIVE mg/dL
Specific Gravity, Urine: 1.015 (ref 1.005–1.030)
pH: 7 (ref 5.0–8.0)

## 2022-11-02 LAB — CBC WITH DIFFERENTIAL/PLATELET
Abs Immature Granulocytes: 0.14 10*3/uL — ABNORMAL HIGH (ref 0.00–0.07)
Basophils Absolute: 0 10*3/uL (ref 0.0–0.1)
Basophils Relative: 0 %
Eosinophils Absolute: 0 10*3/uL (ref 0.0–0.5)
Eosinophils Relative: 0 %
HCT: 37.6 % (ref 36.0–46.0)
Hemoglobin: 13.1 g/dL (ref 12.0–15.0)
Immature Granulocytes: 2 %
Lymphocytes Relative: 14 %
Lymphs Abs: 1.3 10*3/uL (ref 0.7–4.0)
MCH: 32.1 pg (ref 26.0–34.0)
MCHC: 34.8 g/dL (ref 30.0–36.0)
MCV: 92.2 fL (ref 80.0–100.0)
Monocytes Absolute: 0.7 10*3/uL (ref 0.1–1.0)
Monocytes Relative: 7 %
Neutro Abs: 7.3 10*3/uL (ref 1.7–7.7)
Neutrophils Relative %: 77 %
Platelets: 178 10*3/uL (ref 150–400)
RBC: 4.08 MIL/uL (ref 3.87–5.11)
RDW: 12.2 % (ref 11.5–15.5)
WBC: 9.4 10*3/uL (ref 4.0–10.5)
nRBC: 0 % (ref 0.0–0.2)

## 2022-11-02 MED ORDER — ENSURE ENLIVE PO LIQD
237.0000 mL | Freq: Two times a day (BID) | ORAL | Status: DC
  Administered 2022-11-02 – 2022-11-08 (×13): 237 mL via ORAL

## 2022-11-02 MED ORDER — POTASSIUM CHLORIDE CRYS ER 20 MEQ PO TBCR
40.0000 meq | EXTENDED_RELEASE_TABLET | Freq: Once | ORAL | Status: AC
  Administered 2022-11-02: 40 meq via ORAL
  Filled 2022-11-02: qty 2

## 2022-11-02 NOTE — Plan of Care (Signed)
  Problem: Clinical Measurements: Goal: Will remain free from infection Outcome: Progressing Goal: Diagnostic test results will improve Outcome: Progressing   Problem: Activity: Goal: Risk for activity intolerance will decrease Outcome: Progressing   Problem: Coping: Goal: Level of anxiety will decrease Outcome: Progressing   Problem: Elimination: Goal: Will not experience complications related to bowel motility Outcome: Progressing Goal: Will not experience complications related to urinary retention Outcome: Progressing   Problem: Safety: Goal: Ability to remain free from injury will improve Outcome: Progressing   Problem: Skin Integrity: Goal: Risk for impaired skin integrity will decrease Outcome: Progressing

## 2022-11-02 NOTE — Plan of Care (Signed)

## 2022-11-02 NOTE — Care Management Important Message (Signed)
Important Message  Patient Details IM Letter given. Name: Ariel [name] MRN: [id number] Date of Birth: 1955/12/28   Medicare Important Message Given:  Yes     Caren Macadam 11/02/2022, 4:03 PM

## 2022-11-02 NOTE — Progress Notes (Signed)
PROGRESS NOTE    Ariel [name]  ZOX:[id number] DOB: 1955-12-31 DOA: 10/29/2022 PCP: Sandford Craze, NP  Brief Narrative: 67 year old female with a history of B-cell lymphoma admitted with generalized weakness and ataxia.  MRI of the brain showed new or recurrent lymphoma involving the cerebral and cerebellar areas dated 10/27/2022.  She started on steroids.  MRI of the cervical thoracic and lumbar spine done shows suspicious area in the thoracic spine concerning for mets.  The C-spine and the lumbar spine does not show evidence of mets.  Patchy intermittent T2 signal abnormality involving the thoracic cord, extending from approximately T4-5 through T11-12. Associated heterogeneous enhancement from T6-7 through T10-11. Finding presumably reflects changes of CNS lymphoma given findings on recent brain MRI.  MRI brain evidence of recurrent or new lymphoma involving the frontal lobes, corpus callosum, internal capsules, middle cerebellar peduncles.  Assessment & Plan:   Principal Problem:   CNS lymphoma (HCC) Active Problems:   High grade B-cell lymphoma (HCC)   UTI (urinary tract infection)   SIADH (syndrome of inappropriate ADH production) (HCC)   Protein-calorie malnutrition, moderate (HCC)   Ataxia  #1 high-grade B-cell lymphoma now with CNS involvement and T-spine involvement-Dr. Ennever's notes from today reviewed.  Chemo orders noted. Patient will be moved to 6 E. to start high-dose methotrexate and Rituxan. On Decadron 10 mg daily. On sodium bicarbonate infusion for alkalizing the urine prior to initiating high-dose methotrexate therapy.  Urine pH today is 7.  #2 urinary tract infection on Keflex urine culture from 10/26/2022 with a E. Coli  #3 protein calorie malnutrition on Zyprexa hopefully to increase appetite   #4 SIADH sodium stable at 134  #5 hypokalemia potassium 3.3 replete Check mag levels  Nutrition Problem: Moderate Malnutrition Etiology: chronic illness,  cancer and cancer related treatments   Signs/Symptoms: moderate fat depletion, moderate muscle depletion, energy intake < 75% for > 7 days    Interventions: Boost Breeze, Ensure Enlive (each supplement provides 350kcal and 20 grams of protein), MVI  Estimated body mass index is 23.65 kg/m as calculated from the following:   Height as of this encounter: 5\' 7"  (1.702 m).   Weight as of this encounter: 68.5 kg.  DVT prophylaxis: SCD  code Status: Full code  family Communication: None  disposition Plan:  Status is: Inpatient Remains inpatient appropriate because: New CNS lymphoma treatment in progress Consultants:  Oncology  Procedures: None Antimicrobials: None  Subjective:  Feels a little better Ate better than yesterday Objective: Vitals:   11/01/22 1303 11/01/22 1700 11/01/22 2150 11/02/22 0606  BP: (!) 105/49 (!) 118/52 (!) 109/56 (!) 117/54  Pulse: 62 (!) 58 (!) 56 (!) 58  Resp:   16 16  Temp: 97.9 F (36.6 C) 97.9 F (36.6 C) 98 F (36.7 C) 98 F (36.7 C)  TempSrc: Oral Oral Oral Oral  SpO2: 99% 100% 96% 97%  Weight:      Height:        Intake/Output Summary (Last 24 hours) at 11/02/2022 1214 Last data filed at 11/02/2022 0600 Gross per 24 hour  Intake 144.17 ml  Output 600 ml  Net -455.83 ml   Filed Weights   10/29/22 2011  Weight: 68.5 kg    Examination:  General exam: Appears in no acute distress, frail Respiratory system: Clear to auscultation. Respiratory effort normal. Cardiovascular system: S1 & S2 heard, RRR. No JVD, murmurs, rubs, gallops or clicks. No pedal edema. Gastrointestinal system: Abdomen is nondistended, soft and nontender. No organomegaly or masses felt.  Normal bowel sounds heard. Central nervous system: Alert and oriented.  Extremities: No edema   Data Reviewed: I have personally reviewed following labs and imaging studies  CBC: Recent Labs  Lab 10/29/22 2022 10/30/22 0516 11/01/22 0145 11/02/22 0500  WBC 5.7 8.6 9.4 9.4   NEUTROABS 4.1  --  7.1 7.3  HGB 14.3 14.7 13.4 13.1  HCT 40.8 42.4 38.4 37.6  MCV 91.7 93.2 92.1 92.2  PLT 180 175 182 178   Basic Metabolic Panel: Recent Labs  Lab 10/29/22 2022 10/30/22 0129 10/30/22 0516 10/30/22 1150 11/01/22 0145 11/02/22 0500  NA 129*  --  130* 133* 131* 134*  K 3.8  --  3.9 3.7 3.4* 3.3*  CL 95*  --  97* 100 99 97*  CO2 25  --  23 22 23 28   GLUCOSE 96  --  119* 197* 105* 154*  BUN 22  --  21 21 18 17   CREATININE 0.82  --  0.73 0.73 0.62 0.60  CALCIUM 9.5  --  9.3 9.4 8.9 9.2  MG  --  2.3 2.4  --   --   --   PHOS  --  3.1 3.5  --   --   --    GFR: Estimated Creatinine Clearance: 65.5 mL/min (by C-G formula based on SCr of 0.6 mg/dL). Liver Function Tests: Recent Labs  Lab 10/29/22 2022 10/30/22 0129 10/30/22 0516 11/01/22 0145 11/02/22 0500  AST 19 18 20 23 16   ALT 32 28 33 65* 46*  ALKPHOS 53 48 50 46 46  BILITOT 0.9 0.6 0.6 0.6 0.3  PROT 6.9 6.6 6.8 6.1* 5.8*  ALBUMIN 4.3 4.0 4.1 3.7 3.4*   No results for input(s): "LIPASE", "AMYLASE" in the last 168 hours. No results for input(s): "AMMONIA" in the last 168 hours. Coagulation Profile: No results for input(s): "INR", "PROTIME" in the last 168 hours. Cardiac Enzymes: Recent Labs  Lab 10/30/22 0129  CKTOTAL 41   BNP (last 3 results) No results for input(s): "PROBNP" in the last 8760 hours. HbA1C: No results for input(s): "HGBA1C" in the last 72 hours. CBG: Recent Labs  Lab 10/29/22 1938  GLUCAP 103*   Lipid Profile: No results for input(s): "CHOL", "HDL", "LDLCALC", "TRIG", "CHOLHDL", "LDLDIRECT" in the last 72 hours. Thyroid Function Tests: No results for input(s): "TSH", "T4TOTAL", "FREET4", "T3FREE", "THYROIDAB" in the last 72 hours.  Anemia Panel: No results for input(s): "VITAMINB12", "FOLATE", "FERRITIN", "TIBC", "IRON", "RETICCTPCT" in the last 72 hours. Sepsis Labs: Recent Labs  Lab 10/30/22 0129  PROCALCITON <0.10    Recent Results (from the past 240  hour(s))  Culture, Urine     Status: Abnormal   Collection Time: 10/26/22 12:56 PM   Specimen: Urine, Clean Catch  Result Value Ref Range Status   Specimen Description   Final    URINE, CLEAN CATCH Performed at [facility] at [facility], [address], [place], [place] [address]    Special Requests   Final    URINE, CLEAN CATCH Performed at [facility] at [facility], [address], [place], [place] [address]    Culture >=100,000 COLONIES/mL ESCHERICHIA COLI (A)  Final   Report Status 10/28/2022 FINAL  Final   Organism ID, Bacteria ESCHERICHIA COLI (A)  Final      Susceptibility   Escherichia coli - MIC*    AMPICILLIN >=32 RESISTANT Resistant     CEFAZOLIN <=4 SENSITIVE Sensitive  CEFEPIME <=0.12 SENSITIVE Sensitive     CEFTRIAXONE <=0.25 SENSITIVE Sensitive     CIPROFLOXACIN <=0.25 SENSITIVE Sensitive     GENTAMICIN >=16 RESISTANT Resistant     IMIPENEM <=0.25 SENSITIVE Sensitive     NITROFURANTOIN <=16 SENSITIVE Sensitive     TRIMETH/SULFA >=320 RESISTANT Resistant     AMPICILLIN/SULBACTAM 16 INTERMEDIATE Intermediate     PIP/TAZO <=4 SENSITIVE Sensitive     * >=100,000 COLONIES/mL ESCHERICHIA COLI         Radiology Studies: No results found.      Scheduled Meds:  cephALEXin  500 mg Oral Q12H   Chlorhexidine Gluconate Cloth  6 each Topical Daily   dexamethasone (DECADRON) injection  10 mg Intravenous Q24H   famotidine  40 mg Oral BID   feeding supplement  237 mL Oral BID BM   OLANZapine  10 mg Oral QHS   polyethylene glycol  17 g Oral BID   sodium bicarbonate/sodium chloride   Mouth Rinse Q4H   sodium chloride flush  10-40 mL Intracatheter Q12H   Continuous Infusions:  sodium bicarbonate 150 mEq in dextrose 5 % 1,150 mL infusion 50 mL/hr at 11/02/22 0618     LOS: 4 days    Time spent: 39 min  Alwyn Ren, MD 11/02/2022, 12:14 PM

## 2022-11-02 NOTE — Progress Notes (Signed)
Ariel [name] is now up on 6 E.  All the orders are written for chemotherapy.  We will do Rituxan along with high-dose methotrexate.  Her urine is being alkalize.  The urine pH is 7..  She feels better.  She ate more yesterday.  She does not have a nausea.  Hopefully, should be able to get out of bed little bit more today and walk a little bit today.  Her labs show sodium 134.  Potassium 3.3.  BUN 17 creatinine 0.6.  Calcium 9.2 with an albumin of 3.4.  Her white cell count is 9.4.  Hemoglobin 13.1.  Platelet count 170,000.  She has had no bowel or bladder changes.  There is no incontinence.  She has had no bleeding.  She has had no cough or shortness of breath.  Again, we are ready for chemotherapy to start.  We should be able to start tomorrow.  I probably would make sure she gets a Rituxan first and then she can have the methotrexate.  She had E. coli in her urine.  I will recheck another urine culture on her.  She currently is on Keflex.  I would think this would be appropriate to try to help with eradication.  Maybe, she has a better appetite because of the improvement in the UTI.  I do not see any issues with her respect to her cognitive function with respect to being on steroids.    Her vital signs are temperature 98.  Pulse 58.  Blood pressure 117/54.  Her head neck exam shows no ocular or oral lesions.  There are no palpable cervical or supraclavicular lymph nodes.  Lungs are clear bilaterally.  Cardiac exam regular rate and rhythm.  Abdomen soft.  Bowel sounds are present.  There is no fluid wave.  Neurological exam is nonfocal.  Extremity shows no clubbing, cyanosis or edema.  Again, Ariel [name] has relapse of the lymphoma.  She has relapse in the CNS and also looks like in the thoracic spine.  I would like to think that the high-dose methotrexate will help.  We will continue to follow her labs.  I will recheck a UA and urine culture on her.  I know that she will get incredible care  from all the staff up on 6 E.   Christin Bach, MD  [name] 17:14

## 2022-11-02 NOTE — Progress Notes (Signed)
PT Cancellation Note  Patient Details Name: Ariel [name] MRN: [id number] DOB: 1956/01/05   Cancelled Treatment:    Reason Eval/Treat Not Completed: Fatigue/lethargy limiting ability to participate, states that she  does not feel up to getting OOB this PM. Will check back another time.  Blanchard Kelch PT Acute Rehabilitation Services Office [phone] Weekend pager-[phone]  [name] 11/02/2022, 4:53 PM

## 2022-11-02 NOTE — TOC Progression Note (Signed)
Transition of Care [facility]) - Progression Note    Patient Details  Name: Ariel [name] MRN: [id number] Date of Birth: 18-Jul-1955  Transition of Care [facility]) CM/SW Contact  Harriett Sine, RN Phone Number:[phone]  11/02/2022, 10:28 AM  Clinical Narrative:    Spoke with pt at bedside about chemo and plans to return to [facility] after hospital stay. Pt says chemo is to start 9/5 with her staying in hospital until complete. Pt states her support system includes her son and daughter.    Expected Discharge Plan:  (pt will return to [facility]) Barriers to Discharge: No Barriers Identified  Expected Discharge Plan and Services       Living arrangements for the past 2 months: Independent Living Facility                           HH Arranged: PT ([facility], has contract with [employer])           Social Determinants of Health (SDOH) Interventions SDOH Screenings   Food Insecurity: No Food Insecurity (10/30/2022)  Housing: Low Risk  (10/30/2022)  Transportation Needs: No Transportation Needs (10/30/2022)  Utilities: Not At Risk (10/30/2022)  Alcohol Screen: Low Risk  (09/05/2022)  Depression (PHQ2-9): Low Risk  (09/06/2022)  Financial Resource Strain: Low Risk  (09/05/2022)  Physical Activity: Sufficiently Active (09/05/2022)  Social Connections: Moderately Isolated (09/05/2022)  Stress: No Stress Concern Present (09/05/2022)  Tobacco Use: Low Risk  (10/29/2022)    Readmission Risk Interventions    10/30/2022    1:29 PM 04/14/2022   10:59 AM 03/27/2022    3:00 PM  Readmission Risk Prevention Plan  Transportation Screening Complete Complete Complete  PCP or Specialist Appt within 5-7 Days Complete    Home Care Screening Complete    Medication Review (RN CM) Complete    Medication Review [employer])  Complete Complete  PCP or Specialist appointment within 3-5 days of discharge  Complete Complete  HRI or Home Care Consult  Complete Complete  SW Recovery  Care/Counseling Consult   Complete  Palliative Care Screening   Complete  Skilled Nursing Facility   Complete

## 2022-11-03 ENCOUNTER — Other Ambulatory Visit: Payer: Self-pay

## 2022-11-03 DIAGNOSIS — C8589 Other specified types of non-Hodgkin lymphoma, extranodal and solid organ sites: Secondary | ICD-10-CM | POA: Diagnosis not present

## 2022-11-03 DIAGNOSIS — C8339 Diffuse large B-cell lymphoma, extranodal and solid organ sites: Secondary | ICD-10-CM | POA: Diagnosis not present

## 2022-11-03 LAB — MAGNESIUM: Magnesium: 2.3 mg/dL (ref 1.7–2.4)

## 2022-11-03 LAB — URINE CULTURE
Culture: NO GROWTH
Special Requests: NORMAL

## 2022-11-03 LAB — URINALYSIS, DIPSTICK ONLY
Bilirubin Urine: NEGATIVE
Glucose, UA: 50 mg/dL — AB
Hgb urine dipstick: NEGATIVE
Ketones, ur: NEGATIVE mg/dL
Leukocytes,Ua: NEGATIVE
Nitrite: NEGATIVE
Protein, ur: 30 mg/dL — AB
Specific Gravity, Urine: 1.03 (ref 1.005–1.030)
pH: 9 — ABNORMAL HIGH (ref 5.0–8.0)

## 2022-11-03 LAB — CBC WITH DIFFERENTIAL/PLATELET
Abs Immature Granulocytes: 0.19 10*3/uL — ABNORMAL HIGH (ref 0.00–0.07)
Basophils Absolute: 0 10*3/uL (ref 0.0–0.1)
Basophils Relative: 0 %
Eosinophils Absolute: 0 10*3/uL (ref 0.0–0.5)
Eosinophils Relative: 0 %
HCT: 35.8 % — ABNORMAL LOW (ref 36.0–46.0)
Hemoglobin: 12.5 g/dL (ref 12.0–15.0)
Immature Granulocytes: 2 %
Lymphocytes Relative: 13 %
Lymphs Abs: 1.2 10*3/uL (ref 0.7–4.0)
MCH: 32.1 pg (ref 26.0–34.0)
MCHC: 34.9 g/dL (ref 30.0–36.0)
MCV: 92 fL (ref 80.0–100.0)
Monocytes Absolute: 0.7 10*3/uL (ref 0.1–1.0)
Monocytes Relative: 8 %
Neutro Abs: 7.1 10*3/uL (ref 1.7–7.7)
Neutrophils Relative %: 77 %
Platelets: 160 10*3/uL (ref 150–400)
RBC: 3.89 MIL/uL (ref 3.87–5.11)
RDW: 12.1 % (ref 11.5–15.5)
WBC: 9.2 10*3/uL (ref 4.0–10.5)
nRBC: 0 % (ref 0.0–0.2)

## 2022-11-03 LAB — COMPREHENSIVE METABOLIC PANEL
ALT: 44 U/L (ref 0–44)
AST: 19 U/L (ref 15–41)
Albumin: 3.3 g/dL — ABNORMAL LOW (ref 3.5–5.0)
Alkaline Phosphatase: 42 U/L (ref 38–126)
Anion gap: 10 (ref 5–15)
BUN: 18 mg/dL (ref 8–23)
CO2: 28 mmol/L (ref 22–32)
Calcium: 9.1 mg/dL (ref 8.9–10.3)
Chloride: 93 mmol/L — ABNORMAL LOW (ref 98–111)
Creatinine, Ser: 0.62 mg/dL (ref 0.44–1.00)
GFR, Estimated: >60 mL/min (ref >60)
Glucose, Bld: 109 mg/dL — ABNORMAL HIGH (ref 70–99)
Potassium: 3.9 mmol/L (ref 3.5–5.1)
Sodium: 131 mmol/L — ABNORMAL LOW (ref 135–145)
Total Bilirubin: 0.6 mg/dL (ref 0.3–1.2)
Total Protein: 5.5 g/dL — ABNORMAL LOW (ref 6.5–8.1)

## 2022-11-03 LAB — LACTATE DEHYDROGENASE: LDH: 118 U/L (ref 98–192)

## 2022-11-03 MED ORDER — METHOTREXATE SODIUM (PF) CHEMO INJECTION 1 GM/40ML
4.0000 g/m2 | Freq: Once | INTRAVENOUS | Status: AC
  Administered 2022-11-03: 7.2 g via INTRAVENOUS
  Filled 2022-11-03: qty 288

## 2022-11-03 MED ORDER — SODIUM CHLORIDE 0.9 % IV SOLN
Freq: Once | INTRAVENOUS | Status: AC
  Administered 2022-11-03: 16 mg via INTRAVENOUS
  Filled 2022-11-03: qty 8

## 2022-11-03 NOTE — Progress Notes (Signed)
So far, everything seems to be doing relatively well with Ariel [name].  She did not eat as much yesterday..  She is ready for chemotherapy today.  Her urine pH is 8.  She clearly has good alkalinization from the bicarb infusion.  Her labs look okay for treatment.  Her sodium is 131.  Potassium 3.9.  BUN 18 creatinine 0.62.  Calcium 9.1 with an albumin of 3.3.  Her white cell count is 9.2.  Hemoglobin 12.5.  Platelet count 160,000.  She still has a bit of a headache.  She has a little bit of nausea.  There is no problem with bowels or bladder.  She was out of bed yesterday.  She did sit in a chair.  Again, we have to get started with chemotherapy today.  Her lab work looks okay for chemotherapy.  All of her vital signs are stable.  Temperature 98.1.  Pulse 60.  Blood pressure 131/67.  Her head neck exam shows no mucositis.  There is no thrush.  There is no adenopathy in the neck.  Lungs are clear bilaterally.  Cardiac exam regular rate and rhythm.  Abdomen is soft.  Bowel sounds are present.  There is no fluid wave.  There is no palpable liver or spleen tip.  Extremity shows no clubbing, cyanosis or edema.  Neurological exam shows no focal neurological deficits.  [name] has relapsed CNS non-Hodgkin's lymphoma.  We will start her on a Rituxan and high dose methotrexate.  We will have to monitor her methotrexate levels.  She will have leucovorin started over the weekend I am sure.  I know that pharmacy will do a good job in helping [place] manage her methotrexate levels.  I do appreciate everybody's help on 6 E.   Christin Bach, MD  Psalm 68:19

## 2022-11-03 NOTE — Progress Notes (Signed)
PT Cancellation Note  Patient Details Name: Ariel [name] MRN: [id number] DOB: Dec 07, 1955   Cancelled Treatment:    Reason Eval/Treat Not Completed: Fatigue/lethargy limiting ability to participate. Pt started chemo today, reports fatigue and wanting to sleep. Pt with eyes closed while speaking to therapist. Will check back as schedule permits.   Domenick Bookbinder PT, DPT 11/03/22, 3:46 PM

## 2022-11-03 NOTE — Plan of Care (Signed)
  Problem: Education: Goal: Knowledge of General Education information will improve Description: Including pain rating scale, medication(s)/side effects and non-pharmacologic comfort measures 11/03/2022 0251 by Ashok Croon, RN Outcome: Progressing 11/03/2022 0251 by Ashok Croon, RN Outcome: Progressing   Problem: Clinical Measurements: Goal: Ability to maintain clinical measurements within normal limits will improve 11/03/2022 0251 by Ashok Croon, RN Outcome: Progressing 11/03/2022 0251 by Ashok Croon, RN Outcome: Progressing

## 2022-11-03 NOTE — Progress Notes (Signed)
PROGRESS NOTE    Ariel [name]  WUJ:[id number] DOB: Jan 16, 1956 DOA: 10/29/2022 PCP: Sandford Craze, NP  Brief Narrative: 67 year old female with a history of B-cell lymphoma admitted with generalized weakness and ataxia.  MRI of the brain showed new or recurrent lymphoma involving the cerebral and cerebellar areas dated 10/27/2022.  She started on steroids.  MRI of the cervical thoracic and lumbar spine done shows suspicious area in the thoracic spine concerning for mets.  The C-spine and the lumbar spine does not show evidence of mets.  Patchy intermittent T2 signal abnormality involving the thoracic cord, extending from approximately T4-5 through T11-12. Associated heterogeneous enhancement from T6-7 through T10-11. Finding presumably reflects changes of CNS lymphoma given findings on recent brain MRI.  MRI brain evidence of recurrent or new lymphoma involving the frontal lobes, corpus callosum, internal capsules, middle cerebellar peduncles.  Assessment & Plan:   Principal Problem:   CNS lymphoma (HCC) Active Problems:   High grade B-cell lymphoma (HCC)   UTI (urinary tract infection)   SIADH (syndrome of inappropriate ADH production) (HCC)   Protein-calorie malnutrition, moderate (HCC)   Ataxia  #1 high-grade B-cell lymphoma now with CNS involvement and T-spine involvement-Dr. Ennever's notes from today reviewed.  Chemo orders noted. Patient will be moved to 6 E. to start high-dose methotrexate and Rituxan. On Decadron 10 mg daily. On sodium bicarbonate infusion for alkalizing the urine prior to initiating high-dose methotrexate therapy.  Urine pH today is 8.  #2 urinary tract infection urine culture from 10/26/2022 with a E. Coli Finished a course of Keflex  #3 protein calorie malnutrition on Zyprexa hopefully to increase appetite   #4 SIADH sodium stable at 131  #5 hypokalemia potassium 3.9 repleted Check mag levels  Nutrition Problem: Moderate Malnutrition Etiology:  chronic illness, cancer and cancer related treatments   Signs/Symptoms: moderate fat depletion, moderate muscle depletion, energy intake < 75% for > 7 days    Interventions: Boost Breeze, Ensure Enlive (each supplement provides 350kcal and 20 grams of protein), MVI  Estimated body mass index is 23.65 kg/m as calculated from the following:   Height as of this encounter: 5\' 7"  (1.702 m).   Weight as of this encounter: 68.5 kg.  DVT prophylaxis: SCD  code Status: Full code  family Communication: None  disposition Plan:  Status is: Inpatient Remains inpatient appropriate because: New CNS lymphoma treatment in progress Consultants:  Oncology  Procedures: None Antimicrobials: None  Subjective:  Resting in bed tired  Did not get out of bed much yesterday  Appetite still low  Aware she is going to start chemo today   objective: Vitals:   11/02/22 0606 11/02/22 1415 11/02/22 2103 11/03/22 0444  BP: (!) 117/54 (!) 111/56 (!) 121/59 131/67  Pulse: (!) 58 62 65 60  Resp: 16 16 16 16   Temp: 98 F (36.7 C) 98.6 F (37 C) 98 F (36.7 C) 98.1 F (36.7 C)  TempSrc: Oral Oral Oral Oral  SpO2: 97% 96% 97% 98%  Weight:      Height:        Intake/Output Summary (Last 24 hours) at 11/03/2022 0841 Last data filed at 11/02/2022 1517 Gross per 24 hour  Intake 475 ml  Output --  Net 475 ml   Filed Weights   10/29/22 2011  Weight: 68.5 kg    Examination:  General exam: Appears in no acute distress, frail Respiratory system: Clear to auscultation. Respiratory effort normal. Cardiovascular system: S1 & S2 heard, RRR. No JVD, murmurs, rubs,  gallops or clicks. No pedal edema. Gastrointestinal system: Abdomen is nondistended, soft and nontender. No organomegaly or masses felt. Normal bowel sounds heard. Central nervous system: Alert and oriented.  Extremities: No edema   Data Reviewed: I have personally reviewed following labs and imaging studies  CBC: Recent Labs  Lab  10/29/22 2022 10/30/22 0516 11/01/22 0145 11/02/22 0500 11/03/22 0530  WBC 5.7 8.6 9.4 9.4 9.2  NEUTROABS 4.1  --  7.1 7.3 7.1  HGB 14.3 14.7 13.4 13.1 12.5  HCT 40.8 42.4 38.4 37.6 35.8*  MCV 91.7 93.2 92.1 92.2 92.0  PLT 180 175 182 178 160   Basic Metabolic Panel: Recent Labs  Lab 10/30/22 0129 10/30/22 0516 10/30/22 1150 11/01/22 0145 11/02/22 0500 11/03/22 0530  NA  --  130* 133* 131* 134* 131*  K  --  3.9 3.7 3.4* 3.3* 3.9  CL  --  97* 100 99 97* 93*  CO2  --  23 22 23 28 28   GLUCOSE  --  119* 197* 105* 154* 109*  BUN  --  21 21 18 17 18   CREATININE  --  0.73 0.73 0.62 0.60 0.62  CALCIUM  --  9.3 9.4 8.9 9.2 9.1  MG 2.3 2.4  --   --   --   --   PHOS 3.1 3.5  --   --   --   --    GFR: Estimated Creatinine Clearance: 65.5 mL/min (by C-G formula based on SCr of 0.62 mg/dL). Liver Function Tests: Recent Labs  Lab 10/30/22 0129 10/30/22 0516 11/01/22 0145 11/02/22 0500 11/03/22 0530  AST 18 20 23 16 19   ALT 28 33 65* 46* 44  ALKPHOS 48 50 46 46 42  BILITOT 0.6 0.6 0.6 0.3 0.6  PROT 6.6 6.8 6.1* 5.8* 5.5*  ALBUMIN 4.0 4.1 3.7 3.4* 3.3*   No results for input(s): "LIPASE", "AMYLASE" in the last 168 hours. No results for input(s): "AMMONIA" in the last 168 hours. Coagulation Profile: No results for input(s): "INR", "PROTIME" in the last 168 hours. Cardiac Enzymes: Recent Labs  Lab 10/30/22 0129  CKTOTAL 41   BNP (last 3 results) No results for input(s): "PROBNP" in the last 8760 hours. HbA1C: No results for input(s): "HGBA1C" in the last 72 hours. CBG: Recent Labs  Lab 10/29/22 1938  GLUCAP 103*   Lipid Profile: No results for input(s): "CHOL", "HDL", "LDLCALC", "TRIG", "CHOLHDL", "LDLDIRECT" in the last 72 hours. Thyroid Function Tests: No results for input(s): "TSH", "T4TOTAL", "FREET4", "T3FREE", "THYROIDAB" in the last 72 hours.  Anemia Panel: No results for input(s): "VITAMINB12", "FOLATE", "FERRITIN", "TIBC", "IRON", "RETICCTPCT" in the  last 72 hours. Sepsis Labs: Recent Labs  Lab 10/30/22 0129  PROCALCITON <0.10    Recent Results (from the past 240 hour(s))  Culture, Urine     Status: Abnormal   Collection Time: 10/26/22 12:56 PM   Specimen: Urine, Clean Catch  Result Value Ref Range Status   Specimen Description   Final    URINE, CLEAN CATCH Performed at [facility] at [facility], [address] [place], [place] [address]    Special Requests   Final    URINE, CLEAN CATCH Performed at [facility] at [facility], [address], [place], [place] [address]    Culture >=100,000 COLONIES/mL ESCHERICHIA COLI (A)  Final   Report Status 10/28/2022 FINAL  Final   Organism ID, Bacteria ESCHERICHIA COLI (A)  Final  Susceptibility   Escherichia coli - MIC*    AMPICILLIN >=32 RESISTANT Resistant     CEFAZOLIN <=4 SENSITIVE Sensitive     CEFEPIME <=0.12 SENSITIVE Sensitive     CEFTRIAXONE <=0.25 SENSITIVE Sensitive     CIPROFLOXACIN <=0.25 SENSITIVE Sensitive     GENTAMICIN >=16 RESISTANT Resistant     IMIPENEM <=0.25 SENSITIVE Sensitive     NITROFURANTOIN <=16 SENSITIVE Sensitive     TRIMETH/SULFA >=320 RESISTANT Resistant     AMPICILLIN/SULBACTAM 16 INTERMEDIATE Intermediate     PIP/TAZO <=4 SENSITIVE Sensitive     * >=100,000 COLONIES/mL ESCHERICHIA COLI         Radiology Studies: No results found.      Scheduled Meds:  Chlorhexidine Gluconate Cloth  6 each Topical Daily   dexamethasone (DECADRON) injection  10 mg Intravenous Q24H   famotidine  40 mg Oral BID   feeding supplement  237 mL Oral BID BM   methotrexate (PF) 7.2 g in dextrose 5 % 1,000 mL chemo infusion  4 g/m2 (Treatment Plan Recorded) Intravenous Once   OLANZapine  10 mg Oral QHS   polyethylene glycol  17 g Oral BID   sodium bicarbonate/sodium chloride   Mouth Rinse Q4H   sodium chloride flush  10-40 mL Intracatheter Q12H   Continuous Infusions:  ondansetron (ZOFRAN) 16  mg in sodium chloride 0.9 % 50 mL IVPB     sodium bicarbonate 150 mEq in dextrose 5 % 1,150 mL infusion 50 mL/hr at 11/02/22 0618     LOS: 5 days    Time spent: 39 min  Alwyn Ren, MD 11/03/2022, 8:41 AM

## 2022-11-04 DIAGNOSIS — C8589 Other specified types of non-Hodgkin lymphoma, extranodal and solid organ sites: Secondary | ICD-10-CM | POA: Diagnosis not present

## 2022-11-04 DIAGNOSIS — C8339 Diffuse large B-cell lymphoma, extranodal and solid organ sites: Secondary | ICD-10-CM | POA: Diagnosis not present

## 2022-11-04 LAB — CBC WITH DIFFERENTIAL/PLATELET
Abs Immature Granulocytes: 0.12 10*3/uL — ABNORMAL HIGH (ref 0.00–0.07)
Basophils Absolute: 0 10*3/uL (ref 0.0–0.1)
Basophils Relative: 0 %
Eosinophils Absolute: 0 10*3/uL (ref 0.0–0.5)
Eosinophils Relative: 0 %
HCT: 38 % (ref 36.0–46.0)
Hemoglobin: 13.2 g/dL (ref 12.0–15.0)
Immature Granulocytes: 1 %
Lymphocytes Relative: 10 %
Lymphs Abs: 1.2 10*3/uL (ref 0.7–4.0)
MCH: 31.8 pg (ref 26.0–34.0)
MCHC: 34.7 g/dL (ref 30.0–36.0)
MCV: 91.6 fL (ref 80.0–100.0)
Monocytes Absolute: 0.7 10*3/uL (ref 0.1–1.0)
Monocytes Relative: 7 %
Neutro Abs: 9.5 10*3/uL — ABNORMAL HIGH (ref 1.7–7.7)
Neutrophils Relative %: 82 %
Platelets: 172 10*3/uL (ref 150–400)
RBC: 4.15 MIL/uL (ref 3.87–5.11)
RDW: 12.1 % (ref 11.5–15.5)
WBC: 11.5 10*3/uL — ABNORMAL HIGH (ref 4.0–10.5)
nRBC: 0 % (ref 0.0–0.2)

## 2022-11-04 LAB — URINALYSIS, DIPSTICK ONLY
Bilirubin Urine: NEGATIVE
Glucose, UA: 150 mg/dL — AB
Hgb urine dipstick: NEGATIVE
Ketones, ur: NEGATIVE mg/dL
Leukocytes,Ua: NEGATIVE
Nitrite: NEGATIVE
Protein, ur: 30 mg/dL — AB
Specific Gravity, Urine: 1.014 (ref 1.005–1.030)
pH: 9 — ABNORMAL HIGH (ref 5.0–8.0)

## 2022-11-04 LAB — COMPREHENSIVE METABOLIC PANEL
ALT: 167 U/L — ABNORMAL HIGH (ref 0–44)
AST: 61 U/L — ABNORMAL HIGH (ref 15–41)
Albumin: 3.4 g/dL — ABNORMAL LOW (ref 3.5–5.0)
Alkaline Phosphatase: 45 U/L (ref 38–126)
Anion gap: 12 (ref 5–15)
BUN: 22 mg/dL (ref 8–23)
CO2: 28 mmol/L (ref 22–32)
Calcium: 9.3 mg/dL (ref 8.9–10.3)
Chloride: 92 mmol/L — ABNORMAL LOW (ref 98–111)
Creatinine, Ser: 0.64 mg/dL (ref 0.44–1.00)
GFR, Estimated: >60 mL/min (ref >60)
Glucose, Bld: 105 mg/dL — ABNORMAL HIGH (ref 70–99)
Potassium: 3.8 mmol/L (ref 3.5–5.1)
Sodium: 132 mmol/L — ABNORMAL LOW (ref 135–145)
Total Bilirubin: 1 mg/dL (ref 0.3–1.2)
Total Protein: 5.7 g/dL — ABNORMAL LOW (ref 6.5–8.1)

## 2022-11-04 LAB — LACTATE DEHYDROGENASE: LDH: 167 U/L (ref 98–192)

## 2022-11-04 MED ORDER — LORAZEPAM 2 MG/ML IJ SOLN
0.5000 mg | INTRAMUSCULAR | Status: DC | PRN
  Administered 2022-11-04 – 2022-11-06 (×3): 0.5 mg via INTRAVENOUS
  Filled 2022-11-04 (×3): qty 1

## 2022-11-04 MED ORDER — SODIUM CHLORIDE 0.9 % IV SOLN: INTRAVENOUS | Status: DC

## 2022-11-04 MED ORDER — PANTOPRAZOLE SODIUM 40 MG IV SOLR: 40.0000 mg | Freq: Two times a day (BID) | INTRAVENOUS | Status: DC

## 2022-11-04 MED ORDER — FAMOTIDINE 20 MG PO TABS
40.0000 mg | ORAL_TABLET | Freq: Two times a day (BID) | ORAL | Status: DC
  Administered 2022-11-04 – 2022-11-07 (×8): 40 mg via ORAL
  Filled 2022-11-04 (×9): qty 2

## 2022-11-04 MED ORDER — LORAZEPAM 2 MG/ML IJ SOLN
0.5000 mg | Freq: Once | INTRAMUSCULAR | Status: DC
  Filled 2022-11-04: qty 1

## 2022-11-04 MED ORDER — LEUCOVORIN CALCIUM INJECTION 100 MG
15.0000 mg | Freq: Four times a day (QID) | INTRAMUSCULAR | Status: AC
  Administered 2022-11-04 – 2022-11-05 (×4): 16 mg via INTRAVENOUS
  Filled 2022-11-04 (×8): qty 0.8

## 2022-11-04 MED ORDER — ONDANSETRON HCL 4 MG PO TABS
4.0000 mg | ORAL_TABLET | Freq: Three times a day (TID) | ORAL | Status: AC
  Administered 2022-11-04 – 2022-11-05 (×4): 4 mg via ORAL
  Filled 2022-11-04 (×4): qty 1

## 2022-11-04 MED ORDER — NYSTATIN 100000 UNIT/ML MT SUSP
5.0000 mL | Freq: Four times a day (QID) | OROMUCOSAL | Status: DC
  Administered 2022-11-04 – 2022-11-06 (×12): 500000 [IU] via ORAL
  Filled 2022-11-04 (×12): qty 5

## 2022-11-04 MED ORDER — SODIUM CHLORIDE 0.9 % IV SOLN
150.0000 mg | Freq: Once | INTRAVENOUS | Status: AC
  Administered 2022-11-04: 150 mg via INTRAVENOUS
  Filled 2022-11-04: qty 5

## 2022-11-04 MED ORDER — ONDANSETRON 8 MG/NS 50 ML IVPB
8.0000 mg | Freq: Three times a day (TID) | INTRAVENOUS | Status: AC
  Administered 2022-11-05: 8 mg via INTRAVENOUS
  Filled 2022-11-04: qty 8
  Filled 2022-11-04 (×5): qty 54

## 2022-11-04 MED ORDER — FLUCONAZOLE 100MG IVPB
100.0000 mg | INTRAVENOUS | Status: AC
  Administered 2022-11-04 – 2022-11-05 (×2): 100 mg via INTRAVENOUS
  Filled 2022-11-04 (×4): qty 50

## 2022-11-04 NOTE — Progress Notes (Signed)
PROGRESS NOTE    Ariel [name]  ZOX:[id number] DOB: Oct 04, 1955 DOA: 10/29/2022 PCP: Sandford Craze, NP  Brief Narrative: 67 year old female with a history of B-cell lymphoma admitted with generalized weakness and ataxia.  MRI of the brain showed new or recurrent lymphoma involving the cerebral and cerebellar areas dated 10/27/2022.  She started on steroids.  MRI of the cervical thoracic and lumbar spine done shows suspicious area in the thoracic spine concerning for mets.  The C-spine and the lumbar spine does not show evidence of mets.  Patchy intermittent T2 signal abnormality involving the thoracic cord, extending from approximately T4-5 through T11-12. Associated heterogeneous enhancement from T6-7 through T10-11. Finding presumably reflects changes of CNS lymphoma given findings on recent brain MRI.  MRI brain evidence of recurrent or new lymphoma involving the frontal lobes, corpus callosum, internal capsules, middle cerebellar peduncles.  Assessment & Plan:   Principal Problem:   CNS lymphoma (HCC) Active Problems:   High grade B-cell lymphoma (HCC)   UTI (urinary tract infection)   SIADH (syndrome of inappropriate ADH production) (HCC)   Protein-calorie malnutrition, moderate (HCC)   Ataxia  #1  High-grade B-cell lymphoma with CNS and T-spine involvement - On high-dose methotrexate and Rituxan. Patient has been nauseous now on Zofran around-the-clock with Ativan.  Continue Pepcid.  Unfortunately Protonix contraindicated with methotrexate.  And all PPIs contraindicated with methotrexate. Receiving a dose of Emend  -On sodium bicarbonate infusion for alkalizing the urine  Urine pH today is 9.  #2 urinary tract infection urine culture from 10/26/2022 with a E. Coli Finished a course of Keflex Repeat urine culture no growth.  #3 protein calorie malnutrition on Zyprexa hopefully to increase appetite   #4 SIADH sodium stable at 132  #5 hypokalemia potassium 3.9  repleted Check mag levels 2.3  #6 thrush started on Diflucan 11/04/2022 Will add nystatin  Nutrition Problem: Moderate Malnutrition Etiology: chronic illness, cancer and cancer related treatments  Signs/Symptoms: moderate fat depletion, moderate muscle depletion, energy intake < 75% for > 7 days  Interventions: Boost Breeze, Ensure Enlive (each supplement provides 350kcal and 20 grams of protein), MVI  Estimated body mass index is 23.65 kg/m as calculated from the following:   Height as of this encounter: 5\' 7"  (1.702 m).   Weight as of this encounter: 68.5 kg.  DVT prophylaxis: SCD  code Status: Full code  family Communication: None  disposition Plan:  Status is: Inpatient Remains inpatient appropriate because: New CNS lymphoma treatment in progress Consultants:  Oncology  Procedures: None Antimicrobials: None  Subjective: Tired did not sleep well nauseated feels weak objective: Vitals:   11/03/22 0444 11/03/22 1458 11/03/22 2115 11/04/22 0446  BP: 131/67 (!) 120/55 122/66 123/73  Pulse: 60 (!) 53 (!) 59 63  Resp: 16 15 14 14   Temp: 98.1 F (36.7 C) 98.2 F (36.8 C) 98.1 F (36.7 C) 98.3 F (36.8 C)  TempSrc: Oral Oral Oral Oral  SpO2: 98% 99% 97% 98%  Weight:      Height:        Intake/Output Summary (Last 24 hours) at 11/04/2022 1007 Last data filed at 11/04/2022 0350 Gross per 24 hour  Intake 2976.82 ml  Output 1851 ml  Net 1125.82 ml   Filed Weights   10/29/22 2011  Weight: 68.5 kg    Examination:  General exam: Appears  frail Respiratory system: Clear to auscultation. Respiratory effort normal. Cardiovascular system: S1 & S2 heard, RRR. No JVD, murmurs, rubs, gallops or clicks. No pedal edema. Gastrointestinal  system: Abdomen is nondistended, soft and nontender. No organomegaly or masses felt. Normal bowel sounds heard. Central nervous system: Alert and oriented.  Extremities: No edema   Data Reviewed: I have personally reviewed following labs and  imaging studies  CBC: Recent Labs  Lab 10/29/22 2022 10/30/22 0516 11/01/22 0145 11/02/22 0500 11/03/22 0530 11/04/22 0547  WBC 5.7 8.6 9.4 9.4 9.2 11.5*  NEUTROABS 4.1  --  7.1 7.3 7.1 9.5*  HGB 14.3 14.7 13.4 13.1 12.5 13.2  HCT 40.8 42.4 38.4 37.6 35.8* 38.0  MCV 91.7 93.2 92.1 92.2 92.0 91.6  PLT 180 175 182 178 160 172   Basic Metabolic Panel: Recent Labs  Lab 10/30/22 0129 10/30/22 0516 10/30/22 1150 11/01/22 0145 11/02/22 0500 11/03/22 0530 11/04/22 0547  NA  --  130* 133* 131* 134* 131* 132*  K  --  3.9 3.7 3.4* 3.3* 3.9 3.8  CL  --  97* 100 99 97* 93* 92*  CO2  --  23 22 23 28 28 28   GLUCOSE  --  119* 197* 105* 154* 109* 105*  BUN  --  21 21 18 17 18 22   CREATININE  --  0.73 0.73 0.62 0.60 0.62 0.64  CALCIUM  --  9.3 9.4 8.9 9.2 9.1 9.3  MG 2.3 2.4  --   --   --  2.3  --   PHOS 3.1 3.5  --   --   --   --   --    GFR: Estimated Creatinine Clearance: 65.5 mL/min (by C-G formula based on SCr of 0.64 mg/dL). Liver Function Tests: Recent Labs  Lab 10/30/22 0516 11/01/22 0145 11/02/22 0500 11/03/22 0530 11/04/22 0547  AST 20 23 16 19  61*  ALT 33 65* 46* 44 167*  ALKPHOS 50 46 46 42 45  BILITOT 0.6 0.6 0.3 0.6 1.0  PROT 6.8 6.1* 5.8* 5.5* 5.7*  ALBUMIN 4.1 3.7 3.4* 3.3* 3.4*   No results for input(s): "LIPASE", "AMYLASE" in the last 168 hours. No results for input(s): "AMMONIA" in the last 168 hours. Coagulation Profile: No results for input(s): "INR", "PROTIME" in the last 168 hours. Cardiac Enzymes: Recent Labs  Lab 10/30/22 0129  CKTOTAL 41   BNP (last 3 results) No results for input(s): "PROBNP" in the last 8760 hours. HbA1C: No results for input(s): "HGBA1C" in the last 72 hours. CBG: Recent Labs  Lab 10/29/22 1938  GLUCAP 103*   Lipid Profile: No results for input(s): "CHOL", "HDL", "LDLCALC", "TRIG", "CHOLHDL", "LDLDIRECT" in the last 72 hours. Thyroid Function Tests: No results for input(s): "TSH", "T4TOTAL", "FREET4", "T3FREE",  "THYROIDAB" in the last 72 hours.  Anemia Panel: No results for input(s): "VITAMINB12", "FOLATE", "FERRITIN", "TIBC", "IRON", "RETICCTPCT" in the last 72 hours. Sepsis Labs: Recent Labs  Lab 10/30/22 0129  PROCALCITON <0.10    Recent Results (from the past 240 hour(s))  Culture, Urine     Status: Abnormal   Collection Time: 10/26/22 12:56 PM   Specimen: Urine, Clean Catch  Result Value Ref Range Status   Specimen Description   Final    URINE, CLEAN CATCH Performed at [facility] at [facility], [address], [place], [place] [address]    Special Requests   Final    URINE, CLEAN CATCH Performed at [facility] at [facility], [address] [place], [place] [address]    Culture >=100,000 COLONIES/mL ESCHERICHIA COLI (A)  Final   Report Status 10/28/2022  FINAL  Final   Organism ID, Bacteria ESCHERICHIA COLI (A)  Final      Susceptibility   Escherichia coli - MIC*    AMPICILLIN >=32 RESISTANT Resistant     CEFAZOLIN <=4 SENSITIVE Sensitive     CEFEPIME <=0.12 SENSITIVE Sensitive     CEFTRIAXONE <=0.25 SENSITIVE Sensitive     CIPROFLOXACIN <=0.25 SENSITIVE Sensitive     GENTAMICIN >=16 RESISTANT Resistant     IMIPENEM <=0.25 SENSITIVE Sensitive     NITROFURANTOIN <=16 SENSITIVE Sensitive     TRIMETH/SULFA >=320 RESISTANT Resistant     AMPICILLIN/SULBACTAM 16 INTERMEDIATE Intermediate     PIP/TAZO <=4 SENSITIVE Sensitive     * >=100,000 COLONIES/mL ESCHERICHIA COLI  Urine Culture (for pregnant, neutropenic or urologic patients or patients with an indwelling urinary catheter)     Status: None   Collection Time: 11/02/22 10:45 AM   Specimen: Urine, Clean Catch  Result Value Ref Range Status   Specimen Description   Final    URINE, CLEAN CATCH Performed at [facility], [address], [place], [place] [address]    Special Requests   Final    Normal Performed at [facility], [address], [place], [place] [address]    Culture   Final    NO GROWTH Performed at [facility], [address], [place], [place] [address]    Report Status 11/03/2022 FINAL  Final         Radiology Studies: No results found.      Scheduled Meds:  Chlorhexidine Gluconate Cloth  6 each Topical Daily   dexamethasone (DECADRON) injection  10 mg Intravenous Q24H   feeding supplement  237 mL Oral BID BM   OLANZapine  10 mg Oral QHS   ondansetron  4 mg Oral Q8H   pantoprazole (PROTONIX) IV  40 mg Intravenous Q12H   polyethylene glycol  17 g Oral BID   sodium bicarbonate/sodium chloride   Mouth Rinse Q4H   sodium chloride flush  10-40 mL Intracatheter Q12H   Continuous Infusions:  fluconazole (DIFLUCAN) IV     fosaprepitant (EMEND) 150 mg in sodium chloride 0.9 % 145 mL IVPB     ondansetron (ZOFRAN) IV     sodium bicarbonate 150 mEq in dextrose 5 % 1,150 mL infusion 50 mL/hr at 11/04/22 0327     LOS: 6 days    Time spent: 39 min  Alwyn Ren, MD 11/04/2022, 10:07 AM

## 2022-11-04 NOTE — Progress Notes (Signed)
Methotrexate/Leucovorin  Methotrexate level 14.3  Plan: Leucovorin 16 mg IV q6h x 4 doses per protocol   Herby Abraham, Pharm.D Use secure chat for questions 11/04/2022 10:56 AM

## 2022-11-04 NOTE — Progress Notes (Signed)
`   Ariel [name] received her chemotherapy yesterday.  She is having problems with this morning.  She is on low vomiting.  Will give her some Ativan.  Will have to make sure she gets Zofran around-the-clock.  I will also give her some Emend.  Not surprising, her LFTs were elevated.  This was after her when she gets methotrexate.  Her sodium is 132.  Potassium 3.8.  BUN 22 creatinine 0.64.  Her SGPT is 167 SGOT 61.  Bilirubin is 1.0.  The white cell count is 11.5.  Hemoglobin 13.2.  Platelet count 172,000.  She will start her leucovorin today.  Again, I told her I appreciate Pharmacy helping me out with this.  We will check her methotrexate levels.  Our goal right now is to get this vomiting under better control.  We will give her a dose of Ativan.  Will have this as needed.  Again I will give her some Zofran around-the-clock for 2 days.  She is already on some Decadron.  She has had no bleeding.  There has been no fever.  She has had no diarrhea.  Again she has not eaten all that well which I am not surprised with.  Her vital signs are temperature 98.3.  Pulse 63.  Blood pressure 123/73.  Her head and neck exam shows thrush.  I will put her on some Diflucan.  Her lungs sound clear bilaterally.  Cardiac exam regular rate and rhythm.  Neurological exam is nonfocal.  Abdomen is soft.  Bowel sounds are present.  She has no fluid wave.  Of note, her urine pH is 9.  This is good and shows that she is alkalinized very nicely.  Again, we have to get her through this vomiting.  It would not surprise me if her LFTs continue to increase.  They have done this in the past.  The Diflucan to help with the thrush.  She certainly may not eat much over the weekend.  She is given IV fluid.  I know the staff of on [address] we will do a fantastic job of with her.   Christin Bach, MD   Psalm 68:19

## 2022-11-04 NOTE — Plan of Care (Signed)
  Problem: Education: Goal: Knowledge of General Education information will improve Description: Including pain rating scale, medication(s)/side effects and non-pharmacologic comfort measures Outcome: Progressing   Problem: Activity: Goal: Risk for activity intolerance will decrease Outcome: Progressing   Problem: Nutrition: Goal: Adequate nutrition will be maintained Outcome: Progressing   

## 2022-11-05 DIAGNOSIS — C8589 Other specified types of non-Hodgkin lymphoma, extranodal and solid organ sites: Secondary | ICD-10-CM | POA: Diagnosis not present

## 2022-11-05 LAB — CBC WITH DIFFERENTIAL/PLATELET
Abs Immature Granulocytes: 0.06 10*3/uL (ref 0.00–0.07)
Basophils Absolute: 0 10*3/uL (ref 0.0–0.1)
Basophils Relative: 0 %
Eosinophils Absolute: 0 10*3/uL (ref 0.0–0.5)
Eosinophils Relative: 0 %
HCT: 39.2 % (ref 36.0–46.0)
Hemoglobin: 13.2 g/dL (ref 12.0–15.0)
Immature Granulocytes: 1 %
Lymphocytes Relative: 10 %
Lymphs Abs: 1.1 10*3/uL (ref 0.7–4.0)
MCH: 31.2 pg (ref 26.0–34.0)
MCHC: 33.7 g/dL (ref 30.0–36.0)
MCV: 92.7 fL (ref 80.0–100.0)
Monocytes Absolute: 0.4 10*3/uL (ref 0.1–1.0)
Monocytes Relative: 3 %
Neutro Abs: 9.1 10*3/uL — ABNORMAL HIGH (ref 1.7–7.7)
Neutrophils Relative %: 86 %
Platelets: 176 10*3/uL (ref 150–400)
RBC: 4.23 MIL/uL (ref 3.87–5.11)
RDW: 12.4 % (ref 11.5–15.5)
WBC: 10.6 10*3/uL — ABNORMAL HIGH (ref 4.0–10.5)
nRBC: 0 % (ref 0.0–0.2)

## 2022-11-05 LAB — URINALYSIS, DIPSTICK ONLY
Bilirubin Urine: NEGATIVE
Glucose, UA: NEGATIVE mg/dL
Hgb urine dipstick: NEGATIVE
Ketones, ur: NEGATIVE mg/dL
Leukocytes,Ua: NEGATIVE
Nitrite: NEGATIVE
Protein, ur: NEGATIVE mg/dL
Specific Gravity, Urine: 1.008 (ref 1.005–1.030)
pH: 9 — ABNORMAL HIGH (ref 5.0–8.0)

## 2022-11-05 LAB — COMPREHENSIVE METABOLIC PANEL
ALT: 133 U/L — ABNORMAL HIGH (ref 0–44)
AST: 34 U/L (ref 15–41)
Albumin: 3.5 g/dL (ref 3.5–5.0)
Alkaline Phosphatase: 47 U/L (ref 38–126)
Anion gap: 12 (ref 5–15)
BUN: 22 mg/dL (ref 8–23)
CO2: 26 mmol/L (ref 22–32)
Calcium: 9.5 mg/dL (ref 8.9–10.3)
Chloride: 99 mmol/L (ref 98–111)
Creatinine, Ser: 0.68 mg/dL (ref 0.44–1.00)
GFR, Estimated: >60 mL/min (ref >60)
Glucose, Bld: 154 mg/dL — ABNORMAL HIGH (ref 70–99)
Potassium: 3.8 mmol/L (ref 3.5–5.1)
Sodium: 137 mmol/L (ref 135–145)
Total Bilirubin: 0.7 mg/dL (ref 0.3–1.2)
Total Protein: 6 g/dL — ABNORMAL LOW (ref 6.5–8.1)

## 2022-11-05 LAB — LACTATE DEHYDROGENASE: LDH: 129 U/L (ref 98–192)

## 2022-11-05 LAB — METHOTREXATE: Methotrexate: 1.14

## 2022-11-05 MED ORDER — LEUCOVORIN CALCIUM INJECTION 100 MG
15.0000 mg | Freq: Four times a day (QID) | INTRAMUSCULAR | Status: AC
  Filled 2022-11-05 (×8): qty 0.8

## 2022-11-05 MED ORDER — FLUCONAZOLE 100 MG PO TABS
100.0000 mg | ORAL_TABLET | Freq: Every day | ORAL | Status: DC
  Administered 2022-11-06 – 2022-11-08 (×3): 100 mg via ORAL
  Filled 2022-11-05 (×3): qty 1

## 2022-11-05 NOTE — Progress Notes (Signed)
Subjective: The patient is seen and examined today.  She is feeling very well today with no concerning complaints.  Her nausea is very well-controlled.  She did not sleep well last night.  She denied having any fever or chills.  She has no nausea, vomiting, diarrhea or constipation.  She has no chest pain or shortness of breath.  She tolerated the first day of her treatment fairly well.  Objective: Vital signs in last 24 hours: Temp:  [97.8 F (36.6 C)-98.5 F (36.9 C)] 98.3 F (36.8 C) (09/08 0550) Pulse Rate:  [65-68] 65 (09/08 0550) Resp:  [14-18] 14 (09/08 0550) BP: (112-133)/(56-67) 133/62 (09/08 0550) SpO2:  [95 %-98 %] 96 % (09/08 0550)  Intake/Output from previous day: 09/07 0701 - 09/08 0700 In: 2634.2 [P.O.:480; I.V.:2065; IV Piggyback:89.2] Out: 1350 [Urine:1350] Intake/Output this shift: No intake/output data recorded.  General appearance: alert, cooperative, fatigued, and no distress Resp: clear to auscultation bilaterally Cardio: regular rate and rhythm, S1, S2 normal, no murmur, click, rub or gallop GI: soft, non-tender; bowel sounds normal; no masses,  no organomegaly Extremities: extremities normal, atraumatic, no cyanosis or edema  Lab Results:  Recent Labs    11/04/22 0547 11/05/22 0449  WBC 11.5* 10.6*  HGB 13.2 13.2  HCT 38.0 39.2  PLT 172 176   BMET Recent Labs    11/04/22 0547 11/05/22 0449  NA 132* 137  K 3.8 3.8  CL 92* 99  CO2 28 26  GLUCOSE 105* 154*  BUN 22 22  CREATININE 0.64 0.68  CALCIUM 9.3 9.5    Studies/Results: No results found.  Medications: I have reviewed the patient's current medications.   Assessment/Plan: This is a very pleasant 67 years old white female with recurrent CNS lymphoma. The patient is currently undergoing treatment with high-dose methotrexate under the care of Dr. Myna Hidalgo.  She started the first dose yesterday and tolerated it fairly well with no concerning adverse effect except for early nausea improved  with her antiemetics. The patient will continue with IV hydration and the leucovorin rescue.  She has no concerning complaints today. Dr. Myna Hidalgo will resume her care tomorrow.  Please call if you have any questions.   LOS: 7 days    Lajuana Matte 11/05/2022

## 2022-11-05 NOTE — Progress Notes (Signed)
Methotrexate/Leucovorin  Methotrexate level 1.14  Plan: Leucovorin 16 mg IV q6h x 4 doses per protocol   Herby Abraham, Pharm.D Use secure chat for questions 11/05/2022 11:03 AM

## 2022-11-05 NOTE — Progress Notes (Signed)
PROGRESS NOTE    Ariel [name]  ZOX:[id number] DOB: [birth year] DOA: 10/29/2022 PCP: Sandford Craze, NP  Brief Narrative: 67 year old female with a history of B-cell lymphoma admitted with generalized weakness and ataxia.  MRI of the brain showed new or recurrent lymphoma involving the cerebral and cerebellar areas dated 10/27/2022.  She started on steroids.  MRI of the cervical thoracic and lumbar spine done shows suspicious area in the thoracic spine concerning for mets.  The C-spine and the lumbar spine does not show evidence of mets.  Patchy intermittent T2 signal abnormality involving the thoracic cord, extending from approximately T4-5 through T11-12. Associated heterogeneous enhancement from T6-7 through T10-11. Finding presumably reflects changes of CNS lymphoma given findings on recent brain MRI.  MRI brain evidence of recurrent or new lymphoma involving the frontal lobes, corpus callosum, internal capsules, middle cerebellar peduncles.  Assessment & Plan:   Principal Problem:   CNS lymphoma (HCC) Active Problems:   High grade B-cell lymphoma (HCC)   UTI (urinary tract infection)   SIADH (syndrome of inappropriate ADH production) (HCC)   Protein-calorie malnutrition, moderate (HCC)   Ataxia  #1  High-grade B-cell lymphoma with CNS and T-spine involvement - On high-dose methotrexate and Rituxan. Patient has been nauseous now on Zofran around-the-clock with Ativan.  Continue Pepcid.  Unfortunately Protonix contraindicated with methotrexate.  And all PPIs contraindicated with methotrexate. Receiving a dose of Emend  -On sodium bicarbonate infusion for alkalizing the urine  Urine pH today is 9.  #2 urinary tract infection urine culture from 10/26/2022 with a E. Coli Finished a course of Keflex Repeat urine culture no growth.  #3 protein calorie malnutrition on Zyprexa hopefully to increase appetite   #4 SIADH sodium stable at 137  #5 hypokalemia potassium 3.9  repleted Check mag levels 2.3  #6 thrush started on Diflucan 11/04/2022 Will add nystatin  Nutrition Problem: Moderate Malnutrition Etiology: chronic illness, cancer and cancer related treatments  Signs/Symptoms: moderate fat depletion, moderate muscle depletion, energy intake < 75% for > 7 days  Interventions: Boost Breeze, Ensure Enlive (each supplement provides 350kcal and 20 grams of protein), MVI  Estimated body mass index is 23.65 kg/m as calculated from the following:   Height as of this encounter: 5\' 7"  (1.702 m).   Weight as of this encounter: 68.5 kg.  DVT prophylaxis: SCD  code Status: Full code  family Communication: None  disposition Plan:  Status is: Inpatient Remains inpatient appropriate because: New CNS lymphoma treatment in progress Consultants:  Oncology  Procedures: None Antimicrobials: None  Subjective:  Nausea better no vomiting  Urine ph 9 objective: Vitals:   11/04/22 0446 11/04/22 1355 11/04/22 2019 11/05/22 0550  BP: 123/73 (!) 112/56 122/67 133/62  Pulse: 63 68 66 65  Resp: 14 15 18 14   Temp: 98.3 F (36.8 C) 98.5 F (36.9 C) 97.8 F (36.6 C) 98.3 F (36.8 C)  TempSrc: Oral Oral Oral Oral  SpO2: 98% 95% 98% 96%  Weight:      Height:        Intake/Output Summary (Last 24 hours) at 11/05/2022 1223 Last data filed at 11/05/2022 0550 Gross per 24 hour  Intake 2634.16 ml  Output 650 ml  Net 1984.16 ml   Filed Weights   10/29/22 2011  Weight: 68.5 kg    Examination:  General exam: Appears  frail Respiratory system: Clear to auscultation. Respiratory effort normal. Cardiovascular system: S1 & S2 heard, RRR. No JVD, murmurs, rubs, gallops or clicks. No pedal edema. Gastrointestinal system:  Abdomen is nondistended, soft and nontender. No organomegaly or masses felt. Normal bowel sounds heard. Central nervous system: Alert and oriented.  Extremities: No edema   Data Reviewed: I have personally reviewed following labs and imaging  studies  CBC: Recent Labs  Lab 11/01/22 0145 11/02/22 0500 11/03/22 0530 11/04/22 0547 11/05/22 0449  WBC 9.4 9.4 9.2 11.5* 10.6*  NEUTROABS 7.1 7.3 7.1 9.5* 9.1*  HGB 13.4 13.1 12.5 13.2 13.2  HCT 38.4 37.6 35.8* 38.0 39.2  MCV 92.1 92.2 92.0 91.6 92.7  PLT 182 178 160 172 176   Basic Metabolic Panel: Recent Labs  Lab 10/30/22 0129 10/30/22 0516 10/30/22 1150 11/01/22 0145 11/02/22 0500 11/03/22 0530 11/04/22 0547 11/05/22 0449  NA  --  130*   < > 131* 134* 131* 132* 137  K  --  3.9   < > 3.4* 3.3* 3.9 3.8 3.8  CL  --  97*   < > 99 97* 93* 92* 99  CO2  --  23   < > 23 28 28 28 26   GLUCOSE  --  119*   < > 105* 154* 109* 105* 154*  BUN  --  21   < > 18 17 18 22 22   CREATININE  --  0.73   < > 0.62 0.60 0.62 0.64 0.68  CALCIUM  --  9.3   < > 8.9 9.2 9.1 9.3 9.5  MG 2.3 2.4  --   --   --  2.3  --   --   PHOS 3.1 3.5  --   --   --   --   --   --    < > = values in this interval not displayed.   GFR: Estimated Creatinine Clearance: 65.5 mL/min (by C-G formula based on SCr of 0.68 mg/dL). Liver Function Tests: Recent Labs  Lab 11/01/22 0145 11/02/22 0500 11/03/22 0530 11/04/22 0547 11/05/22 0449  AST 23 16 19  61* 34  ALT 65* 46* 44 167* 133*  ALKPHOS 46 46 42 45 47  BILITOT 0.6 0.3 0.6 1.0 0.7  PROT 6.1* 5.8* 5.5* 5.7* 6.0*  ALBUMIN 3.7 3.4* 3.3* 3.4* 3.5   No results for input(s): "LIPASE", "AMYLASE" in the last 168 hours. No results for input(s): "AMMONIA" in the last 168 hours. Coagulation Profile: No results for input(s): "INR", "PROTIME" in the last 168 hours. Cardiac Enzymes: Recent Labs  Lab 10/30/22 0129  CKTOTAL 41   BNP (last 3 results) No results for input(s): "PROBNP" in the last 8760 hours. HbA1C: No results for input(s): "HGBA1C" in the last 72 hours. CBG: Recent Labs  Lab 10/29/22 1938  GLUCAP 103*   Lipid Profile: No results for input(s): "CHOL", "HDL", "LDLCALC", "TRIG", "CHOLHDL", "LDLDIRECT" in the last 72 hours. Thyroid  Function Tests: No results for input(s): "TSH", "T4TOTAL", "FREET4", "T3FREE", "THYROIDAB" in the last 72 hours.  Anemia Panel: No results for input(s): "VITAMINB12", "FOLATE", "FERRITIN", "TIBC", "IRON", "RETICCTPCT" in the last 72 hours. Sepsis Labs: Recent Labs  Lab 10/30/22 0129  PROCALCITON <0.10    Recent Results (from the past 240 hour(s))  Culture, Urine     Status: Abnormal   Collection Time: 10/26/22 12:56 PM   Specimen: Urine, Clean Catch  Result Value Ref Range Status   Specimen Description   Final    URINE, CLEAN CATCH Performed at [facility] at [facility], [address], [place], [place] [address]    Special Requests   Final  URINE, CLEAN CATCH Performed at [facility] at [facility], [address], [place], [place] [address]    Culture >=100,000 COLONIES/mL ESCHERICHIA COLI (A)  Final   Report Status 10/28/2022 FINAL  Final   Organism ID, Bacteria ESCHERICHIA COLI (A)  Final      Susceptibility   Escherichia coli - MIC*    AMPICILLIN >=32 RESISTANT Resistant     CEFAZOLIN <=4 SENSITIVE Sensitive     CEFEPIME <=0.12 SENSITIVE Sensitive     CEFTRIAXONE <=0.25 SENSITIVE Sensitive     CIPROFLOXACIN <=0.25 SENSITIVE Sensitive     GENTAMICIN >=16 RESISTANT Resistant     IMIPENEM <=0.25 SENSITIVE Sensitive     NITROFURANTOIN <=16 SENSITIVE Sensitive     TRIMETH/SULFA >=320 RESISTANT Resistant     AMPICILLIN/SULBACTAM 16 INTERMEDIATE Intermediate     PIP/TAZO <=4 SENSITIVE Sensitive     * >=100,000 COLONIES/mL ESCHERICHIA COLI  Urine Culture (for pregnant, neutropenic or urologic patients or patients with an indwelling urinary catheter)     Status: None   Collection Time: 11/02/22 10:45 AM   Specimen: Urine, Clean Catch  Result Value Ref Range Status   Specimen Description   Final    URINE, CLEAN CATCH Performed at [facility], [address] [place], [place] [address]     Special Requests   Final    Normal Performed at [facility], [address] [place], [place] [address]    Culture   Final    NO GROWTH Performed at [facility], [address], [place], [place] [address]    Report Status 11/03/2022 FINAL  Final         Radiology Studies: No results found.      Scheduled Meds:  Chlorhexidine Gluconate Cloth  6 each Topical Daily   dexamethasone (DECADRON) injection  10 mg Intravenous Q24H   famotidine  40 mg Oral BID   feeding supplement  237 mL Oral BID BM   [START ON 11/06/2022] fluconazole  100 mg Oral Daily   leucovorin  16 mg Intravenous Q6H   nystatin  5 mL Oral QID   OLANZapine  10 mg Oral QHS   ondansetron  4 mg Oral Q8H   polyethylene glycol  17 g Oral BID   sodium bicarbonate/sodium chloride   Mouth Rinse Q4H   sodium chloride flush  10-40 mL Intracatheter Q12H   Continuous Infusions:  sodium chloride 50 mL/hr at 11/05/22 0832   ondansetron (ZOFRAN) IV Stopped (11/05/22 0045)   sodium bicarbonate 150 mEq in dextrose 5 % 1,150 mL infusion 50 mL/hr at 11/05/22 0500     LOS: 7 days    Time spent: 39 min  Alwyn Ren, MD 11/05/2022, 12:23 PM

## 2022-11-06 ENCOUNTER — Inpatient Hospital Stay (HOSPITAL_COMMUNITY): Payer: Medicare Other

## 2022-11-06 DIAGNOSIS — C8589 Other specified types of non-Hodgkin lymphoma, extranodal and solid organ sites: Secondary | ICD-10-CM | POA: Diagnosis not present

## 2022-11-06 DIAGNOSIS — M79606 Pain in leg, unspecified: Secondary | ICD-10-CM | POA: Diagnosis not present

## 2022-11-06 LAB — CBC WITH DIFFERENTIAL/PLATELET
Abs Immature Granulocytes: 0.04 10*3/uL (ref 0.00–0.07)
Basophils Absolute: 0 10*3/uL (ref 0.0–0.1)
Basophils Relative: 0 %
Eosinophils Absolute: 0 10*3/uL (ref 0.0–0.5)
Eosinophils Relative: 0 %
HCT: 36.9 % (ref 36.0–46.0)
Hemoglobin: 12.8 g/dL (ref 12.0–15.0)
Immature Granulocytes: 1 %
Lymphocytes Relative: 11 %
Lymphs Abs: 0.9 10*3/uL (ref 0.7–4.0)
MCH: 32.2 pg (ref 26.0–34.0)
MCHC: 34.7 g/dL (ref 30.0–36.0)
MCV: 92.9 fL (ref 80.0–100.0)
Monocytes Absolute: 0.2 10*3/uL (ref 0.1–1.0)
Monocytes Relative: 3 %
Neutro Abs: 6.9 10*3/uL (ref 1.7–7.7)
Neutrophils Relative %: 85 %
Platelets: 163 10*3/uL (ref 150–400)
RBC: 3.97 MIL/uL (ref 3.87–5.11)
RDW: 12.1 % (ref 11.5–15.5)
WBC: 8 10*3/uL (ref 4.0–10.5)
nRBC: 0 % (ref 0.0–0.2)

## 2022-11-06 LAB — COMPREHENSIVE METABOLIC PANEL
ALT: 101 U/L — ABNORMAL HIGH (ref 0–44)
AST: 25 U/L (ref 15–41)
Albumin: 3.4 g/dL — ABNORMAL LOW (ref 3.5–5.0)
Alkaline Phosphatase: 46 U/L (ref 38–126)
Anion gap: 8 (ref 5–15)
BUN: 29 mg/dL — ABNORMAL HIGH (ref 8–23)
CO2: 26 mmol/L (ref 22–32)
Calcium: 8.8 mg/dL — ABNORMAL LOW (ref 8.9–10.3)
Chloride: 99 mmol/L (ref 98–111)
Creatinine, Ser: 0.51 mg/dL (ref 0.44–1.00)
GFR, Estimated: >60 mL/min (ref >60)
Glucose, Bld: 152 mg/dL — ABNORMAL HIGH (ref 70–99)
Potassium: 3.6 mmol/L (ref 3.5–5.1)
Sodium: 133 mmol/L — ABNORMAL LOW (ref 135–145)
Total Bilirubin: 0.7 mg/dL (ref 0.3–1.2)
Total Protein: 5.8 g/dL — ABNORMAL LOW (ref 6.5–8.1)

## 2022-11-06 LAB — METHOTREXATE: Methotrexate: 0.16

## 2022-11-06 LAB — URINALYSIS, DIPSTICK ONLY
Bilirubin Urine: NEGATIVE
Glucose, UA: NEGATIVE mg/dL
Hgb urine dipstick: NEGATIVE
Ketones, ur: NEGATIVE mg/dL
Leukocytes,Ua: NEGATIVE
Nitrite: NEGATIVE
Protein, ur: NEGATIVE mg/dL
Specific Gravity, Urine: 1.013 (ref 1.005–1.030)
pH: 9 — ABNORMAL HIGH (ref 5.0–8.0)

## 2022-11-06 MED ORDER — ENOXAPARIN SODIUM 40 MG/0.4ML IJ SOSY
40.0000 mg | PREFILLED_SYRINGE | Freq: Every day | INTRAMUSCULAR | Status: DC
  Administered 2022-11-06 – 2022-11-08 (×3): 40 mg via SUBCUTANEOUS
  Filled 2022-11-06 (×3): qty 0.4

## 2022-11-06 MED ORDER — LEUCOVORIN CALCIUM INJECTION 100 MG
16.0000 mg | Freq: Four times a day (QID) | INTRAMUSCULAR | Status: AC
  Administered 2022-11-06 – 2022-11-07 (×4): 16 mg via INTRAVENOUS
  Filled 2022-11-06 (×4): qty 0.8

## 2022-11-06 MED ORDER — LEUCOVORIN CALCIUM INJECTION 100 MG
16.0000 mg | INTRAMUSCULAR | Status: AC
  Administered 2022-11-06: 16 mg via INTRAVENOUS

## 2022-11-06 NOTE — Progress Notes (Signed)
Events noted over the weekend.  This morning, she really sounds great.  She sounds like her old self.  I am unsure exactly what happened over the weekend.  However, I do think that we probably have had steroids that may have caused some of the issue.  I will stop the steroids.  She is not have any headache..  She says she will take the leucovorin.  I told her that leucovorin would not cause any problems for her.  I told her that leucovorin is necessary so she does not develop any side effects to the chemotherapy.  I want to try to protect her liver and kidneys.  Yesterday, her methotrexate level is down to 1.1.  We will see what it is today.  I do appreciate Pharmacy helping [place] out with the leucovorin dosing.  She has had no nausea or vomiting.  She has had no mouth sores.  She has had no visual changes.  Her labs show sodium 133.  Potassium 3.6.  BUN 29 creatinine 0.51.  Calcium 8.8 with an albumin of 3.4.  The SGPT is 101 SGOT is 25.  Bilirubin 0.7.  White cell count 8.0 hemoglobin 12.8.  Platelet count 163,000.   Her vital signs are temperature 98.7.  Pulse 74.  Blood pressure 134/61.  Her head neck exam shows no ocular or oral lesions.  She has no scleral icterus.  There is no mucositis in the oral cavity.  Lungs are clear bilaterally.  Cardiac exam regular rate and rhythm.  Abdomen is soft.  Bowel sounds are present.  She has no guarding or rebound tenderness.  Extremity shows no clubbing, cyanosis or edema.  Neurological exam shows no focal neurological deficits.   Again, had to believe that the steroids may have had an effect on [name] over the weekend as to why she would not take the leucovorin.  Again she seems very good today.  She looks and sounds like she is back to her old self.  She will take the leucovorin.  We will see what the methotrexate level is.  Hopefully, if all goes well, we can get her out by Wednesday or Thursday.  I do appreciate the wonderful care that she is getting  from all the staff upon 6 E.   Ariel Bach, MD  Franky Macho 1:37

## 2022-11-06 NOTE — Progress Notes (Signed)
PROGRESS NOTE    Ariel [name]  GMW:[id number] DOB: January 11, 1956 DOA: 10/29/2022 PCP: Sandford Craze, NP  Brief Narrative: 67 year old female with a history of B-cell lymphoma admitted with generalized weakness and ataxia.  MRI of the brain showed new or recurrent lymphoma involving the cerebral and cerebellar areas dated 10/27/2022.  She started on steroids.  MRI of the cervical thoracic and lumbar spine done shows suspicious area in the thoracic spine concerning for mets.  The C-spine and the lumbar spine does not show evidence of mets.  Patchy intermittent T2 signal abnormality involving the thoracic cord, extending from approximately T4-5 through T11-12. Associated heterogeneous enhancement from T6-7 through T10-11. Finding presumably reflects changes of CNS lymphoma given findings on recent brain MRI.  MRI brain evidence of recurrent or new lymphoma involving the frontal lobes, corpus callosum, internal capsules, middle cerebellar peduncles.  Assessment & Plan:   Principal Problem:   CNS lymphoma (HCC) Active Problems:   High grade B-cell lymphoma (HCC)   UTI (urinary tract infection)   SIADH (syndrome of inappropriate ADH production) (HCC)   Protein-calorie malnutrition, moderate (HCC)   Ataxia  #1  High-grade B-cell lymphoma with CNS and T-spine involvement - On high-dose methotrexate and Rituxan.  She received her first dose of methotrexate.  Further doses will be as an outpatient down the line few weeks from now.  She agrees to take leucovorin.  Nausea better.  She is anxious to eat something today. Continue Pepcid as PPIs contraindicated with methotrexate. -On sodium bicarbonate infusion for alkalizing the urine  Urine pH is 9.  #2 urinary tract infection urine culture from 10/26/2022 with a E. Coli Finished a course of Keflex Repeat urine culture no growth.  #3 protein calorie malnutrition on Zyprexa hopefully to increase appetite   #4 SIADH sodium stable at 137  #5  hypokalemia potassium 3.9 repleted Check mag levels 2.3  #6 thrush started on Diflucan 11/04/2022 Will add nystatin  #7 bilateral leg pain check Doppler of the bilateral lower extremities to rule out DVT  Nutrition Problem: Moderate Malnutrition Etiology: chronic illness, cancer and cancer related treatments  Signs/Symptoms: moderate fat depletion, moderate muscle depletion, energy intake < 75% for > 7 days  Interventions: Boost Breeze, Ensure Enlive (each supplement provides 350kcal and 20 grams of protein), MVI  Estimated body mass index is 23.65 kg/m as calculated from the following:   Height as of this encounter: 5\' 7"  (1.702 m).   Weight as of this encounter: 68.5 kg.  DVT prophylaxis: Lovenox  code Status: Full code  family Communication: None  disposition Plan:  Status is: Inpatient Remains inpatient appropriate because: New CNS lymphoma treatment in progress Consultants:  Oncology  Procedures: None Antimicrobials: None  Subjective: Smiling feels better anxious to eat some food nausea gone feeling hungry  objective: Vitals:   11/05/22 0550 11/05/22 1349 11/05/22 2043 11/06/22 0506  BP: 133/62 (!) 126/52 127/61 134/61  Pulse: 65 76 71 74  Resp: 14  14 14   Temp: 98.3 F (36.8 C) 99.2 F (37.3 C) 98.5 F (36.9 C) 98.7 F (37.1 C)  TempSrc: Oral Oral Oral Oral  SpO2: 96% 96% 96% 97%  Weight:      Height:        Intake/Output Summary (Last 24 hours) at 11/06/2022 1037 Last data filed at 11/06/2022 0510 Gross per 24 hour  Intake --  Output 1650 ml  Net -1650 ml   Filed Weights   10/29/22 2011  Weight: 68.5 kg    Examination:  General exam: Appears  frail Respiratory system: Clear to auscultation. Respiratory effort normal. Cardiovascular system: S1 & S2 heard, RRR. No JVD, murmurs, rubs, gallops or clicks. No pedal edema. Gastrointestinal system: Abdomen is nondistended, soft and nontender. No organomegaly or masses felt. Normal bowel sounds  heard. Central nervous system: Alert and oriented.  Extremities: No edema   Data Reviewed: I have personally reviewed following labs and imaging studies  CBC: Recent Labs  Lab 11/02/22 0500 11/03/22 0530 11/04/22 0547 11/05/22 0449 11/06/22 0537  WBC 9.4 9.2 11.5* 10.6* 8.0  NEUTROABS 7.3 7.1 9.5* 9.1* 6.9  HGB 13.1 12.5 13.2 13.2 12.8  HCT 37.6 35.8* 38.0 39.2 36.9  MCV 92.2 92.0 91.6 92.7 92.9  PLT 178 160 172 176 163   Basic Metabolic Panel: Recent Labs  Lab 11/02/22 0500 11/03/22 0530 11/04/22 0547 11/05/22 0449 11/06/22 0537  NA 134* 131* 132* 137 133*  K 3.3* 3.9 3.8 3.8 3.6  CL 97* 93* 92* 99 99  CO2 28 28 28 26 26   GLUCOSE 154* 109* 105* 154* 152*  BUN 17 18 22 22  29*  CREATININE 0.60 0.62 0.64 0.68 0.51  CALCIUM 9.2 9.1 9.3 9.5 8.8*  MG  --  2.3  --   --   --    GFR: Estimated Creatinine Clearance: 65.5 mL/min (by C-G formula based on SCr of 0.51 mg/dL). Liver Function Tests: Recent Labs  Lab 11/02/22 0500 11/03/22 0530 11/04/22 0547 11/05/22 0449 11/06/22 0537  AST 16 19 61* 34 25  ALT 46* 44 167* 133* 101*  ALKPHOS 46 42 45 47 46  BILITOT 0.3 0.6 1.0 0.7 0.7  PROT 5.8* 5.5* 5.7* 6.0* 5.8*  ALBUMIN 3.4* 3.3* 3.4* 3.5 3.4*   No results for input(s): "LIPASE", "AMYLASE" in the last 168 hours. No results for input(s): "AMMONIA" in the last 168 hours. Coagulation Profile: No results for input(s): "INR", "PROTIME" in the last 168 hours. Cardiac Enzymes: No results for input(s): "CKTOTAL", "CKMB", "CKMBINDEX", "TROPONINI" in the last 168 hours.  BNP (last 3 results) No results for input(s): "PROBNP" in the last 8760 hours. HbA1C: No results for input(s): "HGBA1C" in the last 72 hours. CBG: No results for input(s): "GLUCAP" in the last 168 hours.  Lipid Profile: No results for input(s): "CHOL", "HDL", "LDLCALC", "TRIG", "CHOLHDL", "LDLDIRECT" in the last 72 hours. Thyroid Function Tests: No results for input(s): "TSH", "T4TOTAL", "FREET4",  "T3FREE", "THYROIDAB" in the last 72 hours.  Anemia Panel: No results for input(s): "VITAMINB12", "FOLATE", "FERRITIN", "TIBC", "IRON", "RETICCTPCT" in the last 72 hours. Sepsis Labs: No results for input(s): "PROCALCITON", "LATICACIDVEN" in the last 168 hours.   Recent Results (from the past 240 hour(s))  Urine Culture (for pregnant, neutropenic or urologic patients or patients with an indwelling urinary catheter)     Status: None   Collection Time: 11/02/22 10:45 AM   Specimen: Urine, Clean Catch  Result Value Ref Range Status   Specimen Description   Final    URINE, CLEAN CATCH Performed at [facility], [address], [place], [place] [address]    Special Requests   Final    Normal Performed at [facility], [address], [place], [place] [address]    Culture   Final    NO GROWTH Performed at [facility], [address], [place], [place] [address]    Report Status 11/03/2022 FINAL  Final         Radiology Studies: No results found.  Scheduled Meds:  Chlorhexidine Gluconate Cloth  6 each Topical Daily   enoxaparin (LOVENOX) injection  40 mg Subcutaneous Daily   famotidine  40 mg Oral BID   feeding supplement  237 mL Oral BID BM   fluconazole  100 mg Oral Daily   leucovorin  16 mg Intravenous Q6H   leucovorin  16 mg Intravenous Q6H   nystatin  5 mL Oral QID   OLANZapine  10 mg Oral QHS   polyethylene glycol  17 g Oral BID   sodium bicarbonate/sodium chloride   Mouth Rinse Q4H   sodium chloride flush  10-40 mL Intracatheter Q12H   Continuous Infusions:  sodium chloride 50 mL/hr at 11/05/22 0832   sodium bicarbonate 150 mEq in dextrose 5 % 1,150 mL infusion 50 mL/hr at 11/05/22 1504     LOS: 8 days    Time spent: 39 min  Alwyn Ren, MD 11/06/2022, 10:37 AM

## 2022-11-06 NOTE — Progress Notes (Signed)
Methotrexate/Leucovorin  Today, 11/06/22  Methotrexate level= 0.16   Plan: Leucovorin 16 mg IV q6h x 4 doses per protocol  Tacy Learn, PharmD Clinical Pharmacist 11/06/2022 10:53 AM

## 2022-11-06 NOTE — Progress Notes (Signed)
Pt has been refusing leucovorin for >24 hrs now. I was told in report she refused it previous night shift due to believing it caused her severe nausea, and it was reported during the day she thought it was causing her night terrors and night sweats. I had a discussion with the pt about why she needs the leucovorin after receiving methotrexate. I showed her the side effects of leucovorin in micromedex. We discussed other likely reasons for having these issues that did not include the leucovorin, and talked about the risk vs benefit of taking it. She is still adamant about not taking it, and is also refusing anything that could make her sleepy. Will have oncologist f/u with pt regarding this issue today. Mick Sell RN

## 2022-11-06 NOTE — Progress Notes (Signed)
BLE venous duplex has been completed.   Results can be found under chart review under CV PROC. 11/06/2022 1:44 PM Nidhi Jacome RVT, RDMS

## 2022-11-06 NOTE — Progress Notes (Signed)
Physical Therapy Treatment Patient Details Name: Ariel [name] MRN: [id number] DOB: [birth year] Today's Date: 11/06/2022   History of Present Illness Presented with ataxia nausea vomiting Patient came from independent living after unwitnessed fall, she has known history of brain tumor and have had relapse, Known history of B-cell lymphoma.  Presents with instability nausea vomiting getting worse. MRI showed new lesions throughout her brain noted as well as cerebellar lesions. Recent Dx of UTI.    PT Comments  Pt alert, talkative, motivated, pleasant this morning. Pt completes bed mobility modified ind, therapist managing IV lines for safety. Pt completes STS transfers with BUE assisting to power up, limited to in room ambulation of 20 ft with RW, no overt LOB, CGA for safety. Pt tolerates exercises; reports R calf pain > L calf pain with standing heel raises on rep 5, pt reports she believes it is soreness but exercise cessation and notified RN of pt complaints.    If plan is discharge home, recommend the following: A little help with walking and/or transfers;A little help with bathing/dressing/bathroom;Assistance with cooking/housework;Assist for transportation;Help with stairs or ramp for entrance   Can travel by private vehicle     Yes  Equipment Recommendations  None recommended by PT    Recommendations for Other Services       Precautions / Restrictions Precautions Precautions: Fall Restrictions Weight Bearing Restrictions: No     Mobility  Bed Mobility Overal bed mobility: Modified Independent             General bed mobility comments: supine to sit with light bedrail use, therapist managing IV lines for safety, modified ind    Transfers Overall transfer level: Needs assistance Equipment used: Rolling walker (2 wheels) Transfers: Sit to/from Stand Sit to Stand: Contact guard assist           General transfer comment: CGA for STS with RW, BUe assisting to power  up    Ambulation/Gait Ambulation/Gait assistance: Contact guard assist Gait Distance (Feet): 20 Feet Assistive device: Rolling walker (2 wheels) Gait Pattern/deviations: Step-to pattern, Decreased stride length Gait velocity: decreased     General Gait Details: short, slow steps with minimal bil foot clearance, no overt LOB, denies dizziness and SOB, amb limited by fatigue   Stairs             Wheelchair Mobility     Tilt Bed    Modified Rankin (Stroke Patients Only)       Balance Overall balance assessment: Needs assistance, History of Falls Sitting-balance support: Feet supported, No upper extremity supported Sitting balance-Leahy Scale: Good     Standing balance support: During functional activity Standing balance-Leahy Scale: Fair Standing balance comment: static standing without UE, dynamic with RW                            Cognition Arousal: Alert Behavior During Therapy: WFL for tasks assessed/performed Overall Cognitive Status: Within Functional Limits for tasks assessed                                          Exercises General Exercises - Lower Extremity Long Arc Quad: Seated, AROM, Strengthening, Both, 10 reps Hip Flexion/Marching: Seated, AROM, Strengthening, Both, 10 reps Toe Raises: Standing, AROM, Strengthening, Both, 5 reps Heel Raises: Standing, AROM, Strengthening, Both, 5 reps Other Exercises Other Exercises: STS reps, 3  reps with BUE assisting, CGA for safety    General Comments        Pertinent Vitals/Pain Pain Assessment Pain Assessment: Faces Faces Pain Scale: Hurts little more Pain Location: R calf > L calf Pain Descriptors / Indicators: Sore, Tender Pain Intervention(s): Limited activity within patient's tolerance, Monitored during session (notified RN)    Home Living                          Prior Function            PT Goals (current goals can now be found in the care plan  section) Acute Rehab PT Goals Patient Stated Goal: return to walking with RW PT Goal Formulation: With patient Time For Goal Achievement: 11/13/22 Potential to Achieve Goals: Good Progress towards PT goals: Progressing toward goals    Frequency    Min 1X/week      PT Plan Current plan remains appropriate    Co-evaluation              AM-PAC PT "6 Clicks" Mobility   Outcome Measure  Help needed turning from your back to your side while in a flat bed without using bedrails?: None Help needed moving from lying on your back to sitting on the side of a flat bed without using bedrails?: None Help needed moving to and from a bed to a chair (including a wheelchair)?: A Little Help needed standing up from a chair using your arms (e.g., wheelchair or bedside chair)?: A Little Help needed to walk in hospital room?: A Little Help needed climbing 3-5 steps with a railing? : A Lot 6 Click Score: 19    End of Session Equipment Utilized During Treatment: Gait belt Activity Tolerance: Patient tolerated treatment well Patient left: in chair;with call bell/phone within reach;with chair alarm set Nurse Communication: Mobility status PT Visit Diagnosis: History of falling (Z91.81);Other abnormalities of gait and mobility (R26.89);Muscle weakness (generalized) (M62.81)     Time: 1914-7829 PT Time Calculation (min) (ACUTE ONLY): 26 min  Charges:    $Therapeutic Exercise: 8-22 mins $Therapeutic Activity: 8-22 mins PT General Charges $$ ACUTE PT VISIT: 1 Visit                     Tori Raine Elsass PT, DPT 11/06/22, 10:20 AM

## 2022-11-07 ENCOUNTER — Telehealth: Payer: Self-pay

## 2022-11-07 ENCOUNTER — Encounter (HOSPITAL_COMMUNITY): Payer: Self-pay | Admitting: Hematology & Oncology

## 2022-11-07 DIAGNOSIS — C8589 Other specified types of non-Hodgkin lymphoma, extranodal and solid organ sites: Secondary | ICD-10-CM | POA: Diagnosis not present

## 2022-11-07 LAB — CBC WITH DIFFERENTIAL/PLATELET
Abs Immature Granulocytes: 0.02 10*3/uL (ref 0.00–0.07)
Basophils Absolute: 0 10*3/uL (ref 0.0–0.1)
Basophils Relative: 0 %
Eosinophils Absolute: 0 10*3/uL (ref 0.0–0.5)
Eosinophils Relative: 1 %
HCT: 36.7 % (ref 36.0–46.0)
Hemoglobin: 12.3 g/dL (ref 12.0–15.0)
Immature Granulocytes: 0 %
Lymphocytes Relative: 18 %
Lymphs Abs: 1 10*3/uL (ref 0.7–4.0)
MCH: 31.7 pg (ref 26.0–34.0)
MCHC: 33.5 g/dL (ref 30.0–36.0)
MCV: 94.6 fL (ref 80.0–100.0)
Monocytes Absolute: 0 10*3/uL — ABNORMAL LOW (ref 0.1–1.0)
Monocytes Relative: 1 %
Neutro Abs: 4.5 10*3/uL (ref 1.7–7.7)
Neutrophils Relative %: 80 %
Platelets: 147 10*3/uL — ABNORMAL LOW (ref 150–400)
RBC: 3.88 MIL/uL (ref 3.87–5.11)
RDW: 12.2 % (ref 11.5–15.5)
WBC: 5.6 10*3/uL (ref 4.0–10.5)
nRBC: 0 % (ref 0.0–0.2)

## 2022-11-07 LAB — COMPREHENSIVE METABOLIC PANEL
ALT: 79 U/L — ABNORMAL HIGH (ref 0–44)
AST: 24 U/L (ref 15–41)
Albumin: 3.1 g/dL — ABNORMAL LOW (ref 3.5–5.0)
Alkaline Phosphatase: 44 U/L (ref 38–126)
Anion gap: 9 (ref 5–15)
BUN: 26 mg/dL — ABNORMAL HIGH (ref 8–23)
CO2: 27 mmol/L (ref 22–32)
Calcium: 8.7 mg/dL — ABNORMAL LOW (ref 8.9–10.3)
Chloride: 98 mmol/L (ref 98–111)
Creatinine, Ser: 0.74 mg/dL (ref 0.44–1.00)
GFR, Estimated: >60 mL/min (ref >60)
Glucose, Bld: 148 mg/dL — ABNORMAL HIGH (ref 70–99)
Potassium: 3.4 mmol/L — ABNORMAL LOW (ref 3.5–5.1)
Sodium: 134 mmol/L — ABNORMAL LOW (ref 135–145)
Total Bilirubin: 0.9 mg/dL (ref 0.3–1.2)
Total Protein: 5.3 g/dL — ABNORMAL LOW (ref 6.5–8.1)

## 2022-11-07 LAB — METHOTREXATE: Methotrexate: 0.15

## 2022-11-07 LAB — URINALYSIS, DIPSTICK ONLY
Bilirubin Urine: NEGATIVE
Glucose, UA: NEGATIVE mg/dL
Hgb urine dipstick: NEGATIVE
Ketones, ur: NEGATIVE mg/dL
Leukocytes,Ua: NEGATIVE
Nitrite: NEGATIVE
Protein, ur: NEGATIVE mg/dL
Specific Gravity, Urine: 1.009 (ref 1.005–1.030)
pH: 9 — ABNORMAL HIGH (ref 5.0–8.0)

## 2022-11-07 MED ORDER — FAMCICLOVIR 500 MG PO TABS
500.0000 mg | ORAL_TABLET | Freq: Every day | ORAL | Status: DC
  Administered 2022-11-07 – 2022-11-08 (×2): 500 mg via ORAL
  Filled 2022-11-07 (×2): qty 1

## 2022-11-07 MED ORDER — LEUCOVORIN CALCIUM INJECTION 100 MG
16.0000 mg | Freq: Four times a day (QID) | INTRAMUSCULAR | Status: AC
  Administered 2022-11-07 (×2): 16 mg via INTRAVENOUS
  Filled 2022-11-07 (×8): qty 0.8

## 2022-11-07 MED ORDER — ACYCLOVIR 5 % EX OINT
TOPICAL_OINTMENT | CUTANEOUS | Status: DC
  Administered 2022-11-07: 1 via TOPICAL
  Filled 2022-11-07: qty 15

## 2022-11-07 NOTE — TOC Progression Note (Addendum)
Transition of Care [facility]) - Progression Note    Patient Details  Name: Ariel [name] MRN: [id number] Date of Birth: [birth year]  [facility]) CM/SW Contact  Harriett Sine, RN Phone Number:[phone]  11/07/2022, 3:12 PM  Clinical Narrative:     Spoke with daughter Verlon Au d/c to [facility] with  HHPT. Verlon Au states the pt has been using [facility] at the ILF.   3:31pm spoke with [facility], it was stated that the pt is free to return at anytime without notification.   Expected Discharge Plan:  (pt will return to [facility]) Barriers to Discharge: No Barriers Identified  Expected Discharge Plan and Services       Living arrangements for the past 2 months: Independent Living Facility                           HH Arranged: PT ([facility], has contract with [employer])           Social Determinants of Health (SDOH) Interventions SDOH Screenings   Food Insecurity: No Food Insecurity (10/30/2022)  Housing: Low Risk  (10/30/2022)  Transportation Needs: No Transportation Needs (10/30/2022)  Utilities: Not At Risk (10/30/2022)  Alcohol Screen: Low Risk  (09/05/2022)  Depression (PHQ2-9): Low Risk  (09/06/2022)  Financial Resource Strain: Low Risk  (09/05/2022)  Physical Activity: Sufficiently Active (09/05/2022)  Social Connections: Moderately Isolated (09/05/2022)  Stress: No Stress Concern Present (09/05/2022)  Tobacco Use: Low Risk  (10/29/2022)    Readmission Risk Interventions    10/30/2022    1:29 PM 04/14/2022   10:59 AM 03/27/2022    3:00 PM  Readmission Risk Prevention Plan  Transportation Screening Complete Complete Complete  PCP or Specialist Appt within 5-7 Days Complete    Home Care Screening Complete    Medication Review (RN CM) Complete    Medication Review Oceanographer)  Complete Complete  PCP or Specialist appointment within 3-5 days of discharge  Complete Complete  HRI or Home Care Consult  Complete Complete  SW Recovery  Care/Counseling Consult   Complete  Palliative Care Screening   Complete  Skilled Nursing Facility   Complete

## 2022-11-07 NOTE — Progress Notes (Signed)
Pt refused to be taken off purewick for UA collection and then had an incontinent episode in the bed. Will continue to try and collect.

## 2022-11-07 NOTE — Progress Notes (Signed)
Methotrexate/Leucovorin  Today, 11/07/22  Methotrexate level= 0.15   Plan: Leucovorin 16 mg IV q6h x 4 doses per protocol  Tacy Learn, PharmD Clinical Pharmacist 11/07/2022 9:56 AM

## 2022-11-07 NOTE — Plan of Care (Signed)

## 2022-11-07 NOTE — Telephone Encounter (Signed)
Pt called RN to let her know that her cell phone was working after being caught in her Mri on Tuesday. She stated that no one, including herself knew her phone was there. She stated they told her not touch it because it was overheated and was probably dead. She however was able to turn the phone back on today and it works well. She wanted to let the MRI department know. Rn did call MRI department and let them know.

## 2022-11-07 NOTE — Progress Notes (Signed)
PROGRESS NOTE    Ariel [name]  MVH:[id number] DOB: 01/08/1956 DOA: 10/29/2022 PCP: Sandford Craze, NP  Brief Narrative: 67 year old female with a history of B-cell lymphoma admitted with generalized weakness and ataxia.  MRI of the brain showed new or recurrent lymphoma involving the cerebral and cerebellar areas dated 10/27/2022.  She started on steroids.  MRI of the cervical thoracic and lumbar spine done shows suspicious area in the thoracic spine concerning for mets.  The C-spine and the lumbar spine does not show evidence of mets.  Patchy intermittent T2 signal abnormality involving the thoracic cord, extending from approximately T4-5 through T11-12. Associated heterogeneous enhancement from T6-7 through T10-11. Finding presumably reflects changes of CNS lymphoma given findings on recent brain MRI. MRI brain evidence of recurrent or new lymphoma involving the frontal lobes, corpus callosum, internal capsules, middle cerebellar peduncles.  Assessment & Plan:   Principal Problem:   CNS lymphoma (HCC) Active Problems:   High grade B-cell lymphoma (HCC)   UTI (urinary tract infection)   SIADH (syndrome of inappropriate ADH production) (HCC)   Protein-calorie malnutrition, moderate (HCC)   Ataxia  #1  High-grade B-cell lymphoma with CNS and T-spine involvement - On high-dose methotrexate and leucovorin  She received her first dose of methotrexate 9/7  Further doses will be as an outpatient down the line few weeks from now.   Nausea better.  She is anxious to eat something today. Continue Pepcid as PPIs contraindicated with methotrexate. -On sodium bicarbonate infusion for alkalizing the urine  Urine pH is 9.  #2 urinary tract infection urine culture from 10/26/2022 with a E. Coli Finished a course of Keflex Repeat urine culture no growth.  #3 protein calorie malnutrition on Zyprexa hopefully to increase appetite   #4 SIADH sodium stable at 137  #5 hypokalemia potassium 3.9  repleted Check mag levels 2.3  #6 thrush started continue Diflucan and nystatin.    #7 bilateral leg pain check Doppler of the bilateral lower extremities to rule out DVT  #8 mucositis she has been started on Famvir and Zovirax.  Nutrition Problem: Moderate Malnutrition Etiology: chronic illness, cancer and cancer related treatments  Signs/Symptoms: moderate fat depletion, moderate muscle depletion, energy intake < 75% for > 7 days  Interventions: Boost Breeze, Ensure Enlive (each supplement provides 350kcal and 20 grams of protein), MVI  Estimated body mass index is 23.65 kg/m as calculated from the following:   Height as of this encounter: 5\' 7"  (1.702 m).   Weight as of this encounter: 68.5 kg.  DVT prophylaxis: Lovenox  code Status: Full code  family Communication: None  disposition Plan:  Status is: Inpatient Remains inpatient appropriate because: New CNS lymphoma treatment in progress Consultants:  Oncology  Procedures: None Antimicrobials: None  Subjective:  She had some discomfort in her lower extremities yesterday Doppler of the lower extremity showed no evidence of DVT she feels okay today no nausea vomiting or diarrhea reported objective: Vitals:   11/06/22 1320 11/06/22 1948 11/07/22 0438 11/07/22 1300  BP: (!) 166/72 125/79 (!) 117/52 (!) 114/52  Pulse: 77 70 69 74  Resp: 20 18 18 16   Temp: 98.1 F (36.7 C) 98.2 F (36.8 C) 98.3 F (36.8 C) 98.6 F (37 C)  TempSrc: Oral Oral Oral Oral  SpO2:  98% 96%   Weight:      Height:        Intake/Output Summary (Last 24 hours) at 11/07/2022 1436 Last data filed at 11/06/2022 2340 Gross per 24 hour  Intake  3081.43 ml  Output --  Net 3081.43 ml   Filed Weights   10/29/22 2011  Weight: 68.5 kg    Examination:  General exam: Appears  frail Respiratory system: Clear to auscultation. Respiratory effort normal. Cardiovascular system: S1 & S2 heard, RRR. No JVD, murmurs, rubs, gallops or clicks. No pedal  edema. Gastrointestinal system: Abdomen is nondistended, soft and nontender. No organomegaly or masses felt. Normal bowel sounds heard. Central nervous system: Alert and oriented.  Extremities: No edema   Data Reviewed: I have personally reviewed following labs and imaging studies  CBC: Recent Labs  Lab 11/03/22 0530 11/04/22 0547 11/05/22 0449 11/06/22 0537 11/07/22 0430  WBC 9.2 11.5* 10.6* 8.0 5.6  NEUTROABS 7.1 9.5* 9.1* 6.9 4.5  HGB 12.5 13.2 13.2 12.8 12.3  HCT 35.8* 38.0 39.2 36.9 36.7  MCV 92.0 91.6 92.7 92.9 94.6  PLT 160 172 176 163 147*   Basic Metabolic Panel: Recent Labs  Lab 11/03/22 0530 11/04/22 0547 11/05/22 0449 11/06/22 0537 11/07/22 0430  NA 131* 132* 137 133* 134*  K 3.9 3.8 3.8 3.6 3.4*  CL 93* 92* 99 99 98  CO2 28 28 26 26 27   GLUCOSE 109* 105* 154* 152* 148*  BUN 18 22 22  29* 26*  CREATININE 0.62 0.64 0.68 0.51 0.74  CALCIUM 9.1 9.3 9.5 8.8* 8.7*  MG 2.3  --   --   --   --    GFR: Estimated Creatinine Clearance: 65.5 mL/min (by C-G formula based on SCr of 0.74 mg/dL). Liver Function Tests: Recent Labs  Lab 11/03/22 0530 11/04/22 0547 11/05/22 0449 11/06/22 0537 11/07/22 0430  AST 19 61* 34 25 24  ALT 44 167* 133* 101* 79*  ALKPHOS 42 45 47 46 44  BILITOT 0.6 1.0 0.7 0.7 0.9  PROT 5.5* 5.7* 6.0* 5.8* 5.3*  ALBUMIN 3.3* 3.4* 3.5 3.4* 3.1*   No results for input(s): "LIPASE", "AMYLASE" in the last 168 hours. No results for input(s): "AMMONIA" in the last 168 hours. Coagulation Profile: No results for input(s): "INR", "PROTIME" in the last 168 hours. Cardiac Enzymes: No results for input(s): "CKTOTAL", "CKMB", "CKMBINDEX", "TROPONINI" in the last 168 hours.  BNP (last 3 results) No results for input(s): "PROBNP" in the last 8760 hours. HbA1C: No results for input(s): "HGBA1C" in the last 72 hours. CBG: No results for input(s): "GLUCAP" in the last 168 hours.  Lipid Profile: No results for input(s): "CHOL", "HDL", "LDLCALC",  "TRIG", "CHOLHDL", "LDLDIRECT" in the last 72 hours. Thyroid Function Tests: No results for input(s): "TSH", "T4TOTAL", "FREET4", "T3FREE", "THYROIDAB" in the last 72 hours.  Anemia Panel: No results for input(s): "VITAMINB12", "FOLATE", "FERRITIN", "TIBC", "IRON", "RETICCTPCT" in the last 72 hours. Sepsis Labs: No results for input(s): "PROCALCITON", "LATICACIDVEN" in the last 168 hours.   Recent Results (from the past 240 hour(s))  Urine Culture (for pregnant, neutropenic or urologic patients or patients with an indwelling urinary catheter)     Status: None   Collection Time: 11/02/22 10:45 AM   Specimen: Urine, Clean Catch  Result Value Ref Range Status   Specimen Description   Final    URINE, CLEAN CATCH Performed at [facility], [address] [place], [place] [address]    Special Requests   Final    Normal Performed at [facility], [address] [place], [place] [address]    Culture   Final    NO GROWTH Performed at [facility], [address] [place],  [place] [address]    Report Status 11/03/2022 FINAL  Final         Radiology Studies: VAS [place] LOWER EXTREMITY VENOUS (DVT)  Result Date: 11/06/2022  Lower Venous DVT Study Patient Name:  SHAWNTAL CONGO  Date of Exam:   11/06/2022 Medical Rec #: [id number]     Accession #:    [id number] Date of Birth: Oct 03, 1955     Patient Gender: F Patient Age:   36 years Exam Location:  [facility] Procedure:      VAS [place] LOWER EXTREMITY VENOUS (DVT) Referring Phys: Lanora Manis Detric Scalisi --------------------------------------------------------------------------------  Indications: Pain.  Risk Factors: Cancer CNS Lymphoma / chemotherapy. Limitations: Poor ultrasound/tissue interface. Comparison Study: Previous exam on 04/13/21 was negative for DVT. Performing Technologist: Ernestene Mention RVT, RDMS  Examination Guidelines: A complete evaluation includes B-mode imaging, spectral Doppler, color Doppler,  and power Doppler as needed of all accessible portions of each vessel. Bilateral testing is considered an integral part of a complete examination. Limited examinations for reoccurring indications may be performed as noted. The reflux portion of the exam is performed with the patient in reverse Trendelenburg.  +---------+---------------+---------+-----------+----------+--------------+ RIGHT    CompressibilityPhasicitySpontaneityPropertiesThrombus Aging +---------+---------------+---------+-----------+----------+--------------+ CFV      Full           Yes      Yes                                 +---------+---------------+---------+-----------+----------+--------------+ SFJ      Full                                                        +---------+---------------+---------+-----------+----------+--------------+ FV Prox  Full           Yes      Yes                                 +---------+---------------+---------+-----------+----------+--------------+ FV Mid   Full           Yes      Yes                                 +---------+---------------+---------+-----------+----------+--------------+ FV DistalFull           Yes      Yes                                 +---------+---------------+---------+-----------+----------+--------------+ PFV      Full                                                        +---------+---------------+---------+-----------+----------+--------------+ POP      Full           Yes      Yes                                 +---------+---------------+---------+-----------+----------+--------------+  PTV      Full                                                        +---------+---------------+---------+-----------+----------+--------------+ PERO     Full                                                        +---------+---------------+---------+-----------+----------+--------------+    +---------+---------------+---------+-----------+----------+--------------+ LEFT     CompressibilityPhasicitySpontaneityPropertiesThrombus Aging +---------+---------------+---------+-----------+----------+--------------+ CFV      Full           Yes      Yes                                 +---------+---------------+---------+-----------+----------+--------------+ SFJ      Full                                                        +---------+---------------+---------+-----------+----------+--------------+ FV Prox  Full           Yes      Yes                                 +---------+---------------+---------+-----------+----------+--------------+ FV Mid   Full           Yes      Yes                                 +---------+---------------+---------+-----------+----------+--------------+ FV DistalFull           Yes      Yes                                 +---------+---------------+---------+-----------+----------+--------------+ PFV      Full                                                        +---------+---------------+---------+-----------+----------+--------------+ POP      Full           Yes      Yes                                 +---------+---------------+---------+-----------+----------+--------------+ PTV      Full                                                        +---------+---------------+---------+-----------+----------+--------------+  PERO     Full                                                        +---------+---------------+---------+-----------+----------+--------------+     Summary: BILATERAL: - No evidence of deep vein thrombosis seen in the lower extremities, bilaterally. -No evidence of popliteal cyst, bilaterally.   *See table(s) above for measurements and observations. Electronically signed by Sherald Hess MD on 11/06/2022 at 4:06:28 PM.    Final         Scheduled Meds:  acyclovir ointment   Topical  Q4H   Chlorhexidine Gluconate Cloth  6 each Topical Daily   enoxaparin (LOVENOX) injection  40 mg Subcutaneous Daily   famciclovir  500 mg Oral Daily   famotidine  40 mg Oral BID   feeding supplement  237 mL Oral BID BM   fluconazole  100 mg Oral Daily   leucovorin  16 mg Intravenous Q6H   OLANZapine  10 mg Oral QHS   polyethylene glycol  17 g Oral BID   sodium bicarbonate/sodium chloride   Mouth Rinse Q4H   sodium chloride flush  10-40 mL Intracatheter Q12H   Continuous Infusions:  sodium chloride 50 mL/hr at 11/07/22 0410   sodium bicarbonate 150 mEq in dextrose 5 % 1,150 mL infusion 50 mL/hr at 11/06/22 2340     LOS: 9 days    Time spent: 39 min  Alwyn Ren, MD 11/07/2022, 2:36 PM

## 2022-11-07 NOTE — Care Management Important Message (Signed)
Important Message  Patient Details IM Letter given. Name: Anandi Sirna MRN: [id number] Date of Birth: Jul 16, 1955   Medicare Important Message Given:  Yes     Caren Macadam 11/07/2022, 1:13 PM

## 2022-11-07 NOTE — Progress Notes (Signed)
Ms. Marchesani is looking quite good.  She is still low but hyperactive.  I think that this is probably from the steroid.  She is off steroids right now.  Her methotrexate levels come down quite nicely.  She is on leucovorin.  Her labs look better.  Her white cell count is 5.6.  Hemoglobin 12.3.  Platelet count 147,000.  The BUN is 26 creatinine 0.74.  SGPT 79 SGOT 24.  She is out of bed a little bit.  She is eating a little bit better.  There is very little in way of nausea or vomiting.  She does have little bit of mucositis.  I will start her on oral Famvir.  I will apply some Zovirax ointment to the lips.  She has had no fever.  There is no headache.  She has had no diarrhea.  She has had no urinary issues.  Her vital signs are temperature 98.3.  Pulse 69.  Blood pressure 117/52.  Her head and neck exam does show limited mucositis on the lower lips.  There is no thrush in the mouth.  Lungs are clear.  Cardiac exam regular rate and rhythm.  Abdomen is soft.  Bowel sounds are present.  There is no guarding or rebound tenderness.  Extremity shows no clubbing, cyanosis or edema.  Neurological exam shows no focal neurological deficits.   Hopefully, Ms. Hibbett will be able to go home tomorrow.  We will see what her methotrexate level is.  It is coming down nicely.  I will start her on Famvir.  She is already on Diflucan.  Will have her take Levaquin or Cipro as an outpatient..  She probably will need to have Neulasta in the office.  Again, I really appreciate Pharmacy help me out with the leucovorin and methotrexate levels.  As always, they do a fantastic job.  The staff up on [address] has also done a great job with her.   Christin Bach, MD  Romans 8:28

## 2022-11-08 ENCOUNTER — Encounter (HOSPITAL_COMMUNITY): Payer: Self-pay | Admitting: Hematology & Oncology

## 2022-11-08 DIAGNOSIS — E440 Moderate protein-calorie malnutrition: Secondary | ICD-10-CM | POA: Diagnosis not present

## 2022-11-08 DIAGNOSIS — R270 Ataxia, unspecified: Secondary | ICD-10-CM | POA: Diagnosis not present

## 2022-11-08 DIAGNOSIS — C8510 Unspecified B-cell lymphoma, unspecified site: Secondary | ICD-10-CM | POA: Diagnosis not present

## 2022-11-08 DIAGNOSIS — C8589 Other specified types of non-Hodgkin lymphoma, extranodal and solid organ sites: Secondary | ICD-10-CM | POA: Diagnosis not present

## 2022-11-08 LAB — COMPREHENSIVE METABOLIC PANEL
ALT: 65 U/L — ABNORMAL HIGH (ref 0–44)
AST: 24 U/L (ref 15–41)
Albumin: 3.2 g/dL — ABNORMAL LOW (ref 3.5–5.0)
Alkaline Phosphatase: 47 U/L (ref 38–126)
Anion gap: 10 (ref 5–15)
BUN: 20 mg/dL (ref 8–23)
CO2: 28 mmol/L (ref 22–32)
Calcium: 8.5 mg/dL — ABNORMAL LOW (ref 8.9–10.3)
Chloride: 96 mmol/L — ABNORMAL LOW (ref 98–111)
Creatinine, Ser: 0.75 mg/dL (ref 0.44–1.00)
GFR, Estimated: >60 mL/min (ref >60)
Glucose, Bld: 159 mg/dL — ABNORMAL HIGH (ref 70–99)
Potassium: 3.5 mmol/L (ref 3.5–5.1)
Sodium: 134 mmol/L — ABNORMAL LOW (ref 135–145)
Total Bilirubin: 0.5 mg/dL (ref 0.3–1.2)
Total Protein: 5.4 g/dL — ABNORMAL LOW (ref 6.5–8.1)

## 2022-11-08 LAB — CBC WITH DIFFERENTIAL/PLATELET
Abs Immature Granulocytes: 0.02 10*3/uL (ref 0.00–0.07)
Basophils Absolute: 0 10*3/uL (ref 0.0–0.1)
Basophils Relative: 0 %
Eosinophils Absolute: 0.3 10*3/uL (ref 0.0–0.5)
Eosinophils Relative: 4 %
HCT: 36.4 % (ref 36.0–46.0)
Hemoglobin: 12.1 g/dL (ref 12.0–15.0)
Immature Granulocytes: 0 %
Lymphocytes Relative: 16 %
Lymphs Abs: 1.1 10*3/uL (ref 0.7–4.0)
MCH: 31.8 pg (ref 26.0–34.0)
MCHC: 33.2 g/dL (ref 30.0–36.0)
MCV: 95.5 fL (ref 80.0–100.0)
Monocytes Absolute: 0.2 10*3/uL (ref 0.1–1.0)
Monocytes Relative: 2 %
Neutro Abs: 5.5 10*3/uL (ref 1.7–7.7)
Neutrophils Relative %: 78 %
Platelets: 138 10*3/uL — ABNORMAL LOW (ref 150–400)
RBC: 3.81 MIL/uL — ABNORMAL LOW (ref 3.87–5.11)
RDW: 12.5 % (ref 11.5–15.5)
WBC: 7.1 10*3/uL (ref 4.0–10.5)
nRBC: 0 % (ref 0.0–0.2)

## 2022-11-08 LAB — URINALYSIS, DIPSTICK ONLY
Bilirubin Urine: NEGATIVE
Glucose, UA: NEGATIVE mg/dL
Hgb urine dipstick: NEGATIVE
Ketones, ur: NEGATIVE mg/dL
Leukocytes,Ua: NEGATIVE
Nitrite: NEGATIVE
Protein, ur: NEGATIVE mg/dL
Specific Gravity, Urine: 1.008 (ref 1.005–1.030)
pH: 9 — ABNORMAL HIGH (ref 5.0–8.0)

## 2022-11-08 LAB — METHOTREXATE: Methotrexate: 0.06

## 2022-11-08 MED ORDER — FAMCICLOVIR 500 MG PO TABS: 500.0000 mg | ORAL_TABLET | Freq: Every day | ORAL | 0 refills | Status: AC

## 2022-11-08 MED ORDER — HEPARIN SOD (PORK) LOCK FLUSH 100 UNIT/ML IV SOLN
500.0000 [IU] | Freq: Once | INTRAVENOUS | Status: AC | PRN
  Administered 2022-11-08: 500 [IU] via INTRAVENOUS
  Filled 2022-11-08: qty 5

## 2022-11-08 MED ORDER — FLUCONAZOLE 100 MG PO TABS: 100.0000 mg | ORAL_TABLET | Freq: Every day | ORAL | 0 refills | Status: AC

## 2022-11-08 NOTE — Progress Notes (Signed)
Physical Therapy Treatment Patient Details Name: Ariel [name] MRN: [id number] DOB: Aug 26, 1955 Today's Date: 11/08/2022   History of Present Illness Presented with ataxia nausea vomiting Patient came from independent living after unwitnessed fall, she has known history of brain tumor and have had relapse, Known history of B-cell lymphoma.  Presents with instability nausea vomiting getting worse. MRI showed new lesions throughout her brain noted as well as cerebellar lesions. Recent Dx of UTI.    PT Comments   Pt admitted with above diagnosis.  Pt currently with functional limitations due to the deficits listed below (see PT Problem List). Pt in bed when PT arrived. Pt agreeable to therapy intervention. Pt reported need to void and able to perform supine to sit and SPT bed <> BSC with S. sit to stand  from EOB with S and min cues, gait tasks in hallway 45 feet with RW, CGA and cues. Pt reported fatigue and mild nausea with gait tasks. Pt elected to sit in recliner and all needs in place. Pt indicated she anticipates hospital d/c today or 9/12 and will participate with HHPT services at [facility] and pt is aware of importance of use of RW in home setting. Pt will benefit from acute skilled PT to increase their independence and safety with mobility to allow discharge.       If plan is discharge home, recommend the following: A little help with walking and/or transfers;A little help with bathing/dressing/bathroom;Assistance with cooking/housework;Assist for transportation;Help with stairs or ramp for entrance   Can travel by private vehicle     Yes  Equipment Recommendations  None recommended by PT    Recommendations for Other Services OT consult     Precautions / Restrictions Precautions Precautions: Fall Precaution Comments: fell just prior to admission Restrictions Weight Bearing Restrictions: No     Mobility  Bed Mobility Overal bed mobility: Modified Independent Bed Mobility:  Supine to Sit     Supine to sit: Supervision     General bed mobility comments: PT managing IV through out intervention min cues and use of bed rail    Transfers Overall transfer level: Needs assistance Equipment used: Rolling walker (2 wheels) Transfers: Sit to/from Stand, Bed to chair/wheelchair/BSC Sit to Stand: Supervision   Step pivot transfers: Supervision       General transfer comment: pt in bed and reported need to void able to complete SPT bed <> BSC with close S and min cues, sit to stand from EOB with S and to recliner with  min cues for poper UE placement    Ambulation/Gait Ambulation/Gait assistance: Contact guard assist Gait Distance (Feet): 45 Feet Assistive device: Rolling walker (2 wheels) Gait Pattern/deviations: Step-to pattern, Decreased stride length Gait velocity: decreased     General Gait Details: slight trunk flexion, short stride length, good foot clearance limited due to fatigue   Stairs             Wheelchair Mobility     Tilt Bed    Modified Rankin (Stroke Patients Only)       Balance Overall balance assessment: Needs assistance, History of Falls Sitting-balance support: Feet supported, No upper extremity supported Sitting balance-Leahy Scale: Good     Standing balance support: During functional activity, Bilateral upper extremity supported, Reliant on assistive device for balance Standing balance-Leahy Scale: Fair Standing balance comment: static standing without UE, dynamic with RW  Cognition Arousal: Alert Behavior During Therapy: WFL for tasks assessed/performed Overall Cognitive Status: Within Functional Limits for tasks assessed                                 General Comments: cues for safety. mild memory issues during session.        Exercises      General Comments        Pertinent Vitals/Pain Pain Assessment Pain Assessment: Faces Faces Pain  Scale: Hurts a little bit Breathing: normal Negative Vocalization: none Facial Expression: smiling or inexpressive Body Language: relaxed Consolability: no need to console PAINAD Score: 0 Pain Location: abdomen Pain Descriptors / Indicators: Sore, Dull, Discomfort Pain Intervention(s): Limited activity within patient's tolerance, Monitored during session    Home Living Family/patient expects to be discharged to:: Assisted living Living Arrangements: Alone Available Help at Discharge: Family;Personal care attendant;Available 24 hours/day (cousin and 2 sitters) Type of Home: House Home Access: Stairs to enter Entrance Stairs-Rails: Right Entrance Stairs-Number of Steps: 1   Home Layout: One level Home Equipment: Shower seat;Grab bars - tub/shower;Grab bars - toilet;BSC/3in1;Toilet riser;Rolling Walker (2 wheels);Cane - single point;Rollator (4 wheels);Wheelchair - manual Additional Comments: Pt reports has been living at ILF since last hospitalization, ind with mobility, has DME needs from spouse, son locally    Prior Function            PT Goals (current goals can now be found in the care plan section) Acute Rehab PT Goals Patient Stated Goal: return to walking with RW PT Goal Formulation: With patient Time For Goal Achievement: 11/13/22 Potential to Achieve Goals: Good Progress towards PT goals: Progressing toward goals    Frequency    Min 1X/week      PT Plan Current plan remains appropriate    Co-evaluation              AM-PAC PT "6 Clicks" Mobility   Outcome Measure  Help needed turning from your back to your side while in a flat bed without using bedrails?: None Help needed moving from lying on your back to sitting on the side of a flat bed without using bedrails?: None Help needed moving to and from a bed to a chair (including a wheelchair)?: None Help needed standing up from a chair using your arms (e.g., wheelchair or bedside chair)?: A Little Help  needed to walk in hospital room?: A Little Help needed climbing 3-5 steps with a railing? : A Lot 6 Click Score: 20    End of Session Equipment Utilized During Treatment: Gait belt Activity Tolerance: Patient tolerated treatment well Patient left: in chair;with call bell/phone within reach;with chair alarm set Nurse Communication: Mobility status PT Visit Diagnosis: History of falling (Z91.81);Other abnormalities of gait and mobility (R26.89);Muscle weakness (generalized) (M62.81) Pain - part of body:  (abdomen)     Time: 1610-9604 PT Time Calculation (min) (ACUTE ONLY): 17 min  Charges:    $Gait Training: 8-22 mins PT General Charges $$ ACUTE PT VISIT: 1 Visit                     Johnny Bridge, PT Acute Rehab    Jacqualyn Posey 11/08/2022, 3:04 PM

## 2022-11-08 NOTE — Discharge Summary (Signed)
Physician Discharge Summary   Patient: Ariel [name] MRN: [id number] DOB: [birth year]  Admit date:     10/29/2022  Discharge date: 11/08/22  Discharge Physician: Carollee Herter   PCP: Sandford Craze, NP   Recommendations at discharge:   F/u with oncology within 2 weeks.  Discharge Diagnoses: Principal Problem:   CNS lymphoma (HCC) Active Problems:   High grade B-cell lymphoma (HCC)   Protein-calorie malnutrition, moderate (HCC)   SIADH (syndrome of inappropriate ADH production) (HCC)   Ataxia   UTI (urinary tract infection)  Resolved Problems:   * No resolved hospital problems. [facility] Course: HPI: Ariel [name] is a 67 yo female with PMH B-cell lymphoma, SIADH, HLD who presented with worsening balance/gait and generalized weakness. She has recently been found to have relapsed lymphoma with CNS involvement.  She underwent MRI brain on 10/27/2022 which showed evidence of new/recurrent lymphoma involving cerebral and cerebellar areas. She was started on steroids per oncology and further imaging has been ordered.  Significant Events: Admitted 10/29/2022 New/recurrence of CNS lymphoma on MRI brain 10-27-2022, MRI T-spine showed spinal cord lymphoma 11-02-2022 decision made by Dr. Myna Hidalgo to pursue chemo with Ritxuan and high dose methotrexate 11-06-2022 started on leucovorin rescue  Significant Labs: Methotrexate level 11-08-22 of 0.06 Methotrexate level 11-04-22 of 14.2  Significant Imaging Studies: MRI brain 10-27-2022 showed Evidence of new/recurrent lymphoma involving the frontal lobes, corpus callosum, internal capsules, and middle cerebellar peduncles. MRI C, T, L-spine showed Patchy intermittent T2 signal abnormality involving the thoracic cord, extending from approximately T4-5 through T11-12. Associated heterogeneous enhancement from T6-7 through T10-11. Finding presumably reflects changes of CNS lymphoma given findings on recent brain MRI.  Antibiotic Therapy: Rocephin 1 g daily  9-2, 9-3 Keflex 9-3 thru 9-5  Procedures:   Consultants: Oncology [facility] Course: * CNS lymphoma [facility]) -T-spine involvement on MRI and brain involvement on MRI brain - treated with Decadron, rituxan and high dose MTX per oncology  UTI (urinary tract infection) - Somewhat symptomatic and was unable to take cipro outpatient for a couple days  -Urinalysis from 10/26/2022 consistent with infection despite current UA on admission being negative -Treated the E. 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  Protein-calorie malnutrition, moderate (HCC) Stable.  High grade B-cell lymphoma (HCC) - follows with Dr. Myna Hidalgo -diagnosed with recurrence with CNS involvement.  MRI brain on 10/27/2022 shows recurrent lesions -MRI T-spine also showing signal abnormality concerning for CNS lymphoma as well.  -oncology decided to treat with rituxan, decadron, high dose methotrexate.      Pain control - [employer] Controlled Substance Reporting System database was reviewed. and patient was instructed, not to drive, operate heavy machinery, perform activities at heights, swimming or participation in water activities or provide baby-sitting services while on Pain, Sleep and Anxiety Medications; until their outpatient Physician has advised to do so again. Also recommended to not to take more than prescribed Pain, Sleep and Anxiety Medications.  Consultants: Myna Hidalgo - Oncology Disposition: Home Diet recommendation:  Regular diet DISCHARGE MEDICATION: Allergies as of 11/08/2022       Reactions   Decadron [dexamethasone] Other (See Comments)   Leg weakness Hallucinations   Plavix [clopidogrel] Hives, Swelling, Other (See Comments)   Lip swelling   Prednisone  Other (See Comments)  Steroids caused psychological issues  Hallucinations   Sulfa Antibiotics Itching, Rash   Codeine Nausea And Vomiting        Medication List     STOP taking these medications    ciprofloxacin 500 MG tablet Commonly known as: Cipro   ibrutinib 420 MG tablet Commonly known as: IMBRUVICA   naphazoline-glycerin 0.012-0.25 % Soln Commonly known as: CLEAR EYES REDNESS       TAKE these medications    acetaminophen 325 MG tablet Commonly known as: TYLENOL Take 1,300 mg by mouth daily as needed for headache, fever, moderate pain or mild pain.   famciclovir 500 MG tablet Commonly known as: FAMVIR Take 1 tablet (500 mg total) by mouth daily. Start taking on: November 09, 2022   fluconazole 100 MG tablet Commonly known as: DIFLUCAN Take 1 tablet (100 mg total) by mouth daily for 4 days. Start taking on: November 09, 2022   ondansetron 8 MG disintegrating tablet Commonly known as: ZOFRAN-ODT Take 1 tablet (8 mg total) by mouth every 8 (eight) hours as needed for nausea or vomiting.       Discharge Exam: Filed Weights   10/29/22 2011  Weight: 68.5 kg   Condition at discharge: fair  The results of significant diagnostics from this hospitalization (including imaging, microbiology, ancillary and laboratory) are listed below for reference.   Imaging Studies: VAS [place] LOWER EXTREMITY VENOUS (DVT)  Result Date: 11/06/2022  Lower Venous DVT Study Patient Name:  Ariel [name]  Date of Exam:   11/06/2022 Medical Rec #: [id number]     Accession #:    [id number] Date of Birth: 01/25/1956     Patient Gender: F Patient Age:   25 years Exam Location:  [facility] Procedure:      VAS [place] LOWER EXTREMITY VENOUS (DVT) Referring Phys: Lanora Manis MATHEWS --------------------------------------------------------------------------------  Indications: Pain.  Risk Factors: Cancer CNS Lymphoma / chemotherapy. Limitations: Poor ultrasound/tissue interface. Comparison  Study: Previous exam on 04/13/21 was negative for DVT. Performing Technologist: Ernestene Mention RVT, RDMS  Examination Guidelines: A complete evaluation includes B-mode imaging, spectral Doppler, color Doppler, and power Doppler as needed of all accessible portions of each vessel. Bilateral testing is considered an integral part of a complete examination. Limited examinations for reoccurring indications may be performed as noted. The reflux portion of the exam is performed with the patient in reverse Trendelenburg.  +---------+---------------+---------+-----------+----------+--------------+ RIGHT    CompressibilityPhasicitySpontaneityPropertiesThrombus Aging +---------+---------------+---------+-----------+----------+--------------+ CFV      Full           Yes      Yes                                 +---------+---------------+---------+-----------+----------+--------------+ SFJ      Full                                                        +---------+---------------+---------+-----------+----------+--------------+ FV Prox  Full           Yes      Yes                                 +---------+---------------+---------+-----------+----------+--------------+ FV Mid   Full  Yes      Yes                                 +---------+---------------+---------+-----------+----------+--------------+ FV DistalFull           Yes      Yes                                 +---------+---------------+---------+-----------+----------+--------------+ PFV      Full                                                        +---------+---------------+---------+-----------+----------+--------------+ POP      Full           Yes      Yes                                 +---------+---------------+---------+-----------+----------+--------------+ PTV      Full                                                         +---------+---------------+---------+-----------+----------+--------------+ PERO     Full                                                        +---------+---------------+---------+-----------+----------+--------------+   +---------+---------------+---------+-----------+----------+--------------+ LEFT     CompressibilityPhasicitySpontaneityPropertiesThrombus Aging +---------+---------------+---------+-----------+----------+--------------+ CFV      Full           Yes      Yes                                 +---------+---------------+---------+-----------+----------+--------------+ SFJ      Full                                                        +---------+---------------+---------+-----------+----------+--------------+ FV Prox  Full           Yes      Yes                                 +---------+---------------+---------+-----------+----------+--------------+ FV Mid   Full           Yes      Yes                                 +---------+---------------+---------+-----------+----------+--------------+ FV DistalFull           Yes      Yes                                 +---------+---------------+---------+-----------+----------+--------------+  PFV      Full                                                        +---------+---------------+---------+-----------+----------+--------------+ POP      Full           Yes      Yes                                 +---------+---------------+---------+-----------+----------+--------------+ PTV      Full                                                        +---------+---------------+---------+-----------+----------+--------------+ PERO     Full                                                        +---------+---------------+---------+-----------+----------+--------------+     Summary: BILATERAL: - No evidence of deep vein thrombosis seen in the lower extremities, bilaterally. -No evidence of  popliteal cyst, bilaterally.   *See table(s) above for measurements and observations. Electronically signed by Sherald Hess MD on 11/06/2022 at 4:06:28 PM.    Final    MR Lumbar Spine W Wo Contrast  Result Date: 10/30/2022 CLINICAL DATA:  Initial evaluation for hematologic malignancy, recurrent NHL. EXAM: MRI LUMBAR SPINE WITHOUT AND WITH CONTRAST TECHNIQUE: Multiplanar and multiecho pulse sequences of the lumbar spine were obtained without and with intravenous contrast. CONTRAST:  6mL GADAVIST GADOBUTROL 1 MMOL/ML IV SOLN COMPARISON:  Comparison made with prior PET-CT from 09/01/2022. FINDINGS: Segmentation: Standard. Lowest well-formed disc space labeled the L5-S1 level. Alignment: 2 mm facet mediated anterolisthesis of L4 on L5. Alignment otherwise normal preservation of the normal lumbar lordosis. Vertebrae: Vertebral body height maintained without acute or chronic fracture. Bone marrow signal intensity within normal limits. 1.9 cm benign hemangioma noted within the L3 vertebral body. No worrisome osseous lesions. Mild reactive edema present about the bilateral L4-5 facets due to facet arthritis. No other abnormal marrow edema or enhancement. Conus medullaris and cauda equina: Conus extends to the L1 level. Conus and cauda equina appear normal. No abnormal enhancement. Paraspinal and other soft tissues: Unremarkable. Disc levels: L1-2: Tiny central disc protrusion minimally indents the ventral thecal sac. No canal or foraminal stenosis. L2-3:  Normal interspace.  Mild facet hypertrophy.  No stenosis. L3-4: Mild far lateral disc bulging. Mild facet hypertrophy. No stenosis. L4-5: Trace anterolisthesis. No significant disc bulge. Moderate bilateral facet arthrosis with mild edema and small joint effusions. No significant spinal stenosis. Foramina remain adequately patent. L5-S1: Mild disc bulge. Mild to moderate bilateral facet arthrosis. No significant spinal stenosis. Foramina remain patent. IMPRESSION: 1.  No evidence for metastatic disease or malignancy within the lumbar spine. 2. Moderate bilateral facet arthrosis at L4-5 with mild reactive edema. Finding could serve as a source for lower back pain. 3. Minor for age noncompressive disc bulging at L1-2 through L5-S1 without significant stenosis  or impingement. Electronically Signed   By: Rise Mu M.D.   On: 10/30/2022 19:45   MR THORACIC SPINE W WO CONTRAST  Result Date: 10/30/2022 CLINICAL DATA:  Initial evaluation for hematologic malignancy, recurrent NHL. EXAM: MRI THORACIC WITHOUT AND WITH CONTRAST TECHNIQUE: Multiplanar and multiecho pulse sequences of the thoracic spine were obtained without and with intravenous contrast. CONTRAST:  6mL GADAVIST GADOBUTROL 1 MMOL/ML IV SOLN COMPARISON:  Comparison made with prior PET-CT from 09/01/2022. FINDINGS: Alignment: Physiologic with preservation of the normal thoracic kyphosis. No listhesis. Vertebrae: Vertebral body height maintained without acute or chronic fracture. Bone marrow signal intensity within normal limits. No discrete or worrisome osseous lesions or abnormal marrow edema. Cord: Patchy intermittent T2 signal abnormality seen involving the thoracic cord, extending from approximately T4-5 through T11-12. Changes are most pronounced within the lower thoracic cord at the level of T10-11 (series 29, image 27). Cord is somewhat expanded at these levels. Following contrast administration. Patchy heterogeneous postcontrast enhancement is seen from T6-7 through T10-11 (series 27, image 10). Finding presumably reflects changes of CNS lymphoma given findings on corresponding brain MRI. Paraspinal and other soft tissues: Unremarkable. Disc levels: No significant disc pathology within the thoracic spine for age. No disc bulge or focal disc herniation. No significant stenosis or impingement. IMPRESSION: 1. Patchy intermittent T2 signal abnormality involving the thoracic cord, extending from approximately  T4-5 through T11-12. Associated heterogeneous enhancement from T6-7 through T10-11. Finding presumably reflects changes of CNS lymphoma given findings on recent brain MRI. 2. No other acute abnormality within the thoracic spine. No significant disc pathology or stenosis. Electronically Signed   By: Rise Mu M.D.   On: 10/30/2022 19:38   MR CERVICAL SPINE W WO CONTRAST  Result Date: 10/30/2022 CLINICAL DATA:  Initial evaluation for hematologic malignancy. Recurrent in HL. EXAM: MRI CERVICAL SPINE WITHOUT AND WITH CONTRAST TECHNIQUE: Multiplanar and multiecho pulse sequences of the cervical spine, to include the craniocervical junction and cervicothoracic junction, were obtained without and with intravenous contrast. CONTRAST:  6mL GADAVIST GADOBUTROL 1 MMOL/ML IV SOLN COMPARISON:  Brain MRI from 10/27/2022. FINDINGS: Alignment: Straightening with mild reversal of the normal cervical lordosis. No significant listhesis. Vertebrae: Vertebral body height maintained without acute or chronic fracture. Bone marrow signal intensity within normal limits. No worrisome osseous lesions. Mild reactive marrow edema and enhancement about the left C7-T1 facet due to facet arthritis. Cord: Normal signal and morphology.  No abnormal enhancement. Posterior Fossa, vertebral arteries, paraspinal tissues: Unremarkable. Disc levels: C2-C3: Negative interspace. Mild right-sided facet hypertrophy. No canal or foraminal stenosis. C3-C4: Left-sided uncovertebral spurring without significant disc bulge. Moderate left with mild right facet hypertrophy. No significant spinal stenosis. Mild left C4 foraminal narrowing. Right neural foramen remains patent. C4-C5: Mild disc bulge with uncovertebral spurring. Moderate right with mild left facet hypertrophy. No spinal stenosis. Mild right C5 foraminal narrowing. Left neural foramen is patent. C5-C6: Degenerate intervertebral disc space narrowing with diffuse disc osteophyte complex.  Broad left paracentral posterior component flattens and indents the ventral thecal sac (series 9, image 23). Mild cord flattening without cord signal changes. Mild spinal stenosis. Moderate right C6 foraminal narrowing. Left neural foramen is patent. C6-C7: Mild uncovertebral spurring without significant disc bulge. No canal or foraminal stenosis. C7-T1: Negative interspace. Moderate left with mild right facet arthrosis. No spinal stenosis. Foramina remain adequately patent. IMPRESSION: 1. No evidence for metastatic disease or malignancy within the cervical spine or spinal cord. 2. Degenerative disc osteophyte complex at C5-6 with resultant mild spinal  stenosis, with moderate right C6 foraminal narrowing. 3. Mild left C4 and right C5 foraminal stenosis related to uncovertebral and facet disease. 4. Moderate left-sided facet hypertrophy at C7-T1 with associated reactive marrow edema. Finding could serve as a source for neck pain. Electronically Signed   By: Rise Mu M.D.   On: 10/30/2022 19:31   MR Brain W Wo Contrast  Result Date: 10/27/2022 CLINICAL DATA:  Brain/CNS neoplasm, monitor. Severe fatigue, nausea. History of CNS lymphoma. EXAM: MRI HEAD WITHOUT AND WITH CONTRAST TECHNIQUE: Multiplanar, multiecho pulse sequences of the brain and surrounding structures were obtained without and with intravenous contrast. CONTRAST:  6mL GADAVIST GADOBUTROL 1 MMOL/ML IV SOLN COMPARISON:  Head MRI 09/05/2022 FINDINGS: Brain: There is new confluent T2 hyperintensity and heterogeneous enhancement in the anteromedial aspects of both frontal lobes extending across the genu of the corpus callosum which is expanded. Similar but smaller regions of new T2 hyperintensity and enhancement are present in the posterior limbs of the left greater than right internal capsules extending into the cerebral peduncles as well as in the right middle cerebellar peduncle. There is also mildly increased T2 hyperintensity in the left  middle cerebellar peduncle compared to the prior study with new mild enhancement. There is no significant mass effect associated with any of these lesions. Encephalomalacia and associated chronic blood products in the lateral left frontal lobe, posterior left parietal lobe, and posterior left temporal lobe are unchanged. No acute infarct or extra-axial fluid collection is evident. There is mild cerebral atrophy. Vascular: Major intracranial vascular flow voids are preserved. Skull and upper cervical spine: No suspicious marrow lesion. Sinuses/Orbits: Bilateral cataract extraction. Paranasal sinuses and mastoid air cells are clear. Other: None. IMPRESSION: Evidence of new/recurrent lymphoma involving the frontal lobes, corpus callosum, internal capsules, and middle cerebellar peduncles. Electronically Signed   By: Sebastian Ache M.D.   On: 10/27/2022 10:16    Microbiology: Results for orders placed or performed during the hospital encounter of 10/29/22  Urine Culture (for pregnant, neutropenic or urologic patients or patients with an indwelling urinary catheter)     Status: None   Collection Time: 11/02/22 10:45 AM   Specimen: Urine, Clean Catch  Result Value Ref Range Status   Specimen Description   Final    URINE, CLEAN CATCH Performed at [facility], [address], [place], [place] [address]    Special Requests   Final    Normal Performed at [facility], [address] [place], [place] [address]    Culture   Final    NO GROWTH Performed at [facility], [address], [place], [place] [address]    Report Status 11/03/2022 FINAL  Final    Labs: CBC: Recent Labs  Lab 11/04/22 0547 11/05/22 0449 11/06/22 0537 11/07/22 0430 11/08/22 0815  WBC 11.5* 10.6* 8.0 5.6 7.1  NEUTROABS 9.5* 9.1* 6.9 4.5 5.5  HGB 13.2 13.2 12.8 12.3 12.1  HCT 38.0 39.2 36.9 36.7 36.4  MCV 91.6 92.7 92.9 94.6 95.5  PLT 172 176 163 147* 138*   Basic Metabolic  Panel: Recent Labs  Lab 11/03/22 0530 11/04/22 0547 11/05/22 0449 11/06/22 0537 11/07/22 0430 11/08/22 0815  NA 131* 132* 137 133* 134* 134*  K 3.9 3.8 3.8 3.6 3.4* 3.5  CL 93* 92* 99 99 98 96*  CO2 28 28 26 26 27 28   GLUCOSE 109* 105* 154* 152* 148* 159*  BUN 18 22 22  29* 26* 20  CREATININE 0.62 0.64 0.68  0.51 0.74 0.75  CALCIUM 9.1 9.3 9.5 8.8* 8.7* 8.5*  MG 2.3  --   --   --   --   --    Liver Function Tests: Recent Labs  Lab 11/04/22 0547 11/05/22 0449 11/06/22 0537 11/07/22 0430 11/08/22 0815  AST 61* 34 25 24 24   ALT 167* 133* 101* 79* 65*  ALKPHOS 45 47 46 44 47  BILITOT 1.0 0.7 0.7 0.9 0.5  PROT 5.7* 6.0* 5.8* 5.3* 5.4*  ALBUMIN 3.4* 3.5 3.4* 3.1* 3.2*     Discharge time spent: greater than 30 minutes.  Signed: Carollee Herter, DO [facility] 11/08/2022

## 2022-11-08 NOTE — Progress Notes (Addendum)
Methotrexate/Leucovorin  Per nursing, patient refusing leucovorin doses; per documentation patient refused 9/11- 0000 and 9/11- 0600 leucovorin doses.  This morning, Dr. Myna Hidalgo was notified and wanted to hold Leucovorin.   Today, 11/08/22  Methotrexate level= 0.06  Plan:   - Updated and notified Dr. Myna Hidalgo of methotrexate level. - Per Dr. Myna Hidalgo -- discontinue Leucovorin.  Tacy Learn, PharmD Clinical Pharmacist 11/08/2022 2:17 PM

## 2022-11-08 NOTE — Plan of Care (Signed)
  Problem: Education: Goal: Knowledge of General Education information will improve Description: Including pain rating scale, medication(s)/side effects and non-pharmacologic comfort measures Outcome: Progressing   Problem: Clinical Measurements: Goal: Ability to maintain clinical measurements within normal limits will improve Outcome: Progressing   

## 2022-11-08 NOTE — TOC Transition Note (Signed)
Transition of Care [facility]) - CM/SW Discharge Note   Patient Details  Name: Ariel [name] MRN: [id number] Date of Birth: 1956/01/23  [facility]) CM/SW Contact:  Harriett Sine, RN Phone Number:[phone]  11/08/2022, 3:01 PM   Clinical Narrative:    Spoke with daughter Verlon Au about d/c and transportation. Verlon Au states [facility] will arrange transportation back to the facility. Spoke with Hass[facility] Neth  to confirm HHPT with [facility]. Daughter was updated. Called [facility] to confirm pt's PT, left message no response.    Final next level of care: Other (comment) ([facility]) Barriers to Discharge: No Barriers Identified   Patient Goals and CMS Choice CMS Medicare.gov Compare Post Acute Care list provided to:: Other (Comment Required) (spoke daughter Verlon Au) Choice offered to / list presented to : Adult Children  Discharge Placement                Patient chooses bed at: [facility] Patient to be transferred to facility by: Hassel Neth will transport pt per stated by daughter Verlon Au Name of family member notified: Verlon Au daughter Patient and family notified of of transfer: 11/08/22  Discharge Plan and Services Additional resources added to the After Visit Summary for                            [facility] Arranged: PT [facility], has contract with [employer])          Social Determinants of Health (SDOH) Interventions SDOH Screenings   Food Insecurity: No Food Insecurity (10/30/2022)  Housing: Low Risk  (10/30/2022)  Transportation Needs: No Transportation Needs (10/30/2022)  Utilities: Not At Risk (10/30/2022)  Alcohol Screen: Low Risk  (09/05/2022)  Depression (PHQ2-9): Low Risk  (09/06/2022)  Financial Resource Strain: Low Risk  (09/05/2022)  Physical Activity: Sufficiently Active (09/05/2022)  Social Connections: Moderately Isolated (09/05/2022)  Stress: No Stress Concern Present (09/05/2022)  Tobacco Use: Low Risk  (10/29/2022)      Readmission Risk Interventions    10/30/2022    1:29 PM 04/14/2022   10:59 AM 03/27/2022    3:00 PM  Readmission Risk Prevention Plan  Transportation Screening Complete Complete Complete  PCP or Specialist Appt within 5-7 Days Complete    Home Care Screening Complete    Medication Review (RN CM) Complete    Medication Review [facility])  Complete Complete  PCP or Specialist appointment within 3-5 days of discharge  Complete Complete  HRI or Home Care Consult  Complete Complete  SW Recovery Care/Counseling Consult   Complete  Palliative Care Screening   Complete  Skilled Nursing Facility   Complete

## 2022-11-08 NOTE — Progress Notes (Signed)
Ariel [name] is doing okay this morning.  There is no lab work back yet.  Her last methotrexate level was down to 0.15.  Hopefully, she will be down to where she would be able to go home either today or tomorrow.  She is doing quite nicely.  She still has a little mucositis on her lower lips.  I had started on some Famvir.  I have her on acyclovir ointment.  She is going to the bathroom.  She is out of bed a little bit.  She is eating well.  There is no nausea or vomiting.  She is able to swallow without difficulty.  She has had no bleeding.  There has been no fever.  I have to believe that the chemotherapy has worked.  Her mental state seems to be much more alert which is nice to see.  Her vital signs show temperature 98.  Pulse 74.  Blood pressure 131/60.  Her head and neck exam does show some mucositis on the lips.  She has no intraoral mucositis.  There is no thrush.  Lungs are clear bilaterally.  She has good air movement bilaterally.  Cardiac exam regular rate and rhythm.  Abdomen is soft.  Bowel sounds are present.  There is no fluid wave.  There is no palpable liver or spleen tip.  Extremity shows no clubbing, cyanosis or edema.  She has good strength in her legs bilaterally.  She has good range of motion.  Neurological exam shows no focal neurological deficits.   Ms. Coppa has the relapsed CNS lymphoma.  She has had high-dose methotrexate with Rituxan.  Again, I believe that she has responded.  We really need to see what her methotrexate level is.  Hopefully, we will be low so we can let her go home.  She, as the more, we will probably need G-CSF.  Her white cell count is actually maintained itself pretty well.  If she does go home today, I need to make sure that she is on ciprofloxacin as a prophylactic antibiotic.  I am just happy that her quality of life is doing better now.  I know that she has had great care from everybody up on 6 E.  I appreciate how much the staff has done to  help her.   Christin Bach, MD  [name] 29:11

## 2022-11-08 NOTE — Progress Notes (Signed)
AVS reviewed w/ Ariel [name]  who verbalized an understanding. Pt asked about clothing. Per notes on admission, there were no clothes w/ pt. Pt does have a bag w/ incontinence pads, small black bag w/ cell phone in it & larger purse w/ brown wallet in it. AVS placed inside purse in the discharge envelope. Small brown bear placed in plastic bag w/ incontinence pads. Pt  changed into a blue disposable gown for trx to home. [facility] to pick pt up

## 2022-11-10 ENCOUNTER — Inpatient Hospital Stay: Payer: Medicare Other

## 2022-11-14 ENCOUNTER — Other Ambulatory Visit: Payer: Self-pay

## 2022-11-15 ENCOUNTER — Encounter: Payer: Self-pay | Admitting: Hematology & Oncology

## 2022-11-15 ENCOUNTER — Inpatient Hospital Stay: Payer: Medicare Other | Attending: Internal Medicine | Admitting: Hematology & Oncology

## 2022-11-15 ENCOUNTER — Other Ambulatory Visit: Payer: Self-pay

## 2022-11-15 ENCOUNTER — Encounter (HOSPITAL_COMMUNITY): Payer: Self-pay

## 2022-11-15 ENCOUNTER — Inpatient Hospital Stay: Payer: Medicare Other

## 2022-11-15 VITALS — BP 151/65 | HR 68 | Temp 97.5°F | Resp 19 | Ht 67.0 in | Wt 148.0 lb

## 2022-11-15 DIAGNOSIS — C8510 Unspecified B-cell lymphoma, unspecified site: Secondary | ICD-10-CM

## 2022-11-15 DIAGNOSIS — C8339 Diffuse large B-cell lymphoma, extranodal and solid organ sites: Secondary | ICD-10-CM | POA: Insufficient documentation

## 2022-11-15 DIAGNOSIS — C8589 Other specified types of non-Hodgkin lymphoma, extranodal and solid organ sites: Secondary | ICD-10-CM

## 2022-11-15 DIAGNOSIS — D509 Iron deficiency anemia, unspecified: Secondary | ICD-10-CM | POA: Insufficient documentation

## 2022-11-15 LAB — CMP (CANCER CENTER ONLY)
ALT: 34 U/L (ref 0–44)
AST: 15 U/L (ref 15–41)
Albumin: 4.6 g/dL (ref 3.5–5.0)
Alkaline Phosphatase: 54 U/L (ref 38–126)
Anion gap: 13 (ref 5–15)
BUN: 12 mg/dL (ref 8–23)
CO2: 21 mmol/L — ABNORMAL LOW (ref 22–32)
Calcium: 9.8 mg/dL (ref 8.9–10.3)
Chloride: 102 mmol/L (ref 98–111)
Creatinine: 0.55 mg/dL (ref 0.44–1.00)
GFR, Estimated: >60 mL/min (ref >60)
Glucose, Bld: 154 mg/dL — ABNORMAL HIGH (ref 70–99)
Potassium: 3.5 mmol/L (ref 3.5–5.1)
Sodium: 136 mmol/L (ref 135–145)
Total Bilirubin: 0.3 mg/dL (ref 0.3–1.2)
Total Protein: 6.4 g/dL — ABNORMAL LOW (ref 6.5–8.1)

## 2022-11-15 LAB — LACTATE DEHYDROGENASE: LDH: 190 U/L (ref 98–192)

## 2022-11-15 LAB — CBC WITH DIFFERENTIAL (CANCER CENTER ONLY)
Abs Immature Granulocytes: 0.02 10*3/uL (ref 0.00–0.07)
Basophils Absolute: 0 10*3/uL (ref 0.0–0.1)
Basophils Relative: 0 %
Eosinophils Absolute: 0.1 10*3/uL (ref 0.0–0.5)
Eosinophils Relative: 2 %
HCT: 36 % (ref 36.0–46.0)
Hemoglobin: 12.4 g/dL (ref 12.0–15.0)
Immature Granulocytes: 1 %
Lymphocytes Relative: 30 %
Lymphs Abs: 1.1 10*3/uL (ref 0.7–4.0)
MCH: 32 pg (ref 26.0–34.0)
MCHC: 34.4 g/dL (ref 30.0–36.0)
MCV: 92.8 fL (ref 80.0–100.0)
Monocytes Absolute: 0.4 10*3/uL (ref 0.1–1.0)
Monocytes Relative: 11 %
Neutro Abs: 2 10*3/uL (ref 1.7–7.7)
Neutrophils Relative %: 56 %
Platelet Count: 115 10*3/uL — ABNORMAL LOW (ref 150–400)
RBC: 3.88 MIL/uL (ref 3.87–5.11)
RDW: 12.9 % (ref 11.5–15.5)
WBC Count: 3.6 10*3/uL — ABNORMAL LOW (ref 4.0–10.5)
nRBC: 0 % (ref 0.0–0.2)

## 2022-11-15 NOTE — Patient Instructions (Signed)

## 2022-11-15 NOTE — Progress Notes (Signed)
Hematology and Oncology Follow Up Visit  Ariel [name] [id number] 03/29/1955 67 y.o. 11/15/2022   Principle Diagnosis:  Diffuse large cell non-Hodgkin's lymphoma-CNS involvement -- relapsed   Past Therapy: Status post chemotherapy with R-ICE/R-MTV --  s/p cycle #4 MATRIX -- s/p cycle #4 -- start on 05/02/2021 --completed on 08/06/2021 Neulasta 6 mg subcu post chemotherapy MTV- s/p cycle #1 -- given on 01/22/204   Current Therapy:        Imbruvica/Rituxan -- started 05/02/2022, s/p cycle #5 -DC on 07/16/2022 --DC on 10/29/2022 High-dose methotrexate/Rituxan-cycle 1 given on 11/03/2022   Interim History:  Ariel [name] is here today for follow-up.  She had progressed.  If we see this on her MRI of the brain.  She also seem to have disease in her thoracic spine on MRI.  We had to admit her.  She got high-dose methotrexate along with Rituxan.  She seemed to tolerate this incredibly well.  She was discharged probably about 5 days later.  She is feels a whole lot better.  She feels more active.  She is going to do some physical therapy.  She has had no bleeding.  There is been no headache.  She has had no visual changes.  She has had no joint aches or pains.  Patient thinks she may have little bit of neuropathy in the feet.  She has had no cough or shortness of breath.  She has had no change in bowel or bladder habits.  Overall, I would say that her performance status is probably ECOG 1.     Medications:  Allergies as of 11/15/2022       Reactions   Decadron [dexamethasone] Other (See Comments)   Leg weakness Hallucinations   Plavix [clopidogrel] Hives, Swelling, Other (See Comments)   Lip swelling   Prednisone Other (See Comments)   Steroids caused psychological issues  Hallucinations   Sulfa Antibiotics Itching, Rash   Codeine Nausea And Vomiting        Medication List        Accurate as of November 15, 2022 11:08 AM. If you have any questions, ask your nurse or doctor.           acetaminophen 325 MG tablet Commonly known as: TYLENOL Take 1,300 mg by mouth daily as needed for headache, fever, moderate pain or mild pain.   famciclovir 500 MG tablet Commonly known as: FAMVIR Take 1 tablet (500 mg total) by mouth daily.   ondansetron 8 MG disintegrating tablet Commonly known as: ZOFRAN-ODT Take 1 tablet (8 mg total) by mouth every 8 (eight) hours as needed for nausea or vomiting.        Allergies:  Allergies  Allergen Reactions   Decadron [Dexamethasone] Other (See Comments)    Leg weakness Hallucinations   Plavix [Clopidogrel] Hives, Swelling and Other (See Comments)    Lip swelling    Prednisone Other (See Comments)    Steroids caused psychological issues  Hallucinations   Sulfa Antibiotics Itching and Rash   Codeine Nausea And Vomiting    Past Medical History, Surgical history, Social history, and Family History were reviewed and updated.  Review of Systems: Review of Systems  Constitutional: Negative.   HENT: Negative.    Eyes: Negative.   Respiratory: Negative.    Cardiovascular: Negative.   Gastrointestinal: Negative.   Genitourinary: Negative.   Musculoskeletal: Negative.   Skin: Negative.   Neurological: Negative.   Endo/Heme/Allergies: Negative.   Psychiatric/Behavioral: Negative.       Physical Exam:  Vital signs are temperature 97.5.  Pulse 68.  Blood pressure 151/65.  Weight is 148 pounds.  Wt Readings from Last 3 Encounters:  10/29/22 151 lb (68.5 kg)  10/26/22 149 lb 0.6 oz (67.6 kg)  09/25/22 156 lb (70.8 kg)    Physical Activity: Sufficiently Active (09/05/2022)   Exercise Vital Sign    Days of Exercise per Week: 6 days    Minutes of Exercise per Session: 40 min   Physical Exam Vitals reviewed.  HENT:     Head: Normocephalic and atraumatic.  Eyes:     Pupils: Pupils are equal, round, and reactive to light.  Cardiovascular:     Rate and Rhythm: Normal rate and regular rhythm.     Heart sounds: Normal  heart sounds.  Pulmonary:     Effort: Pulmonary effort is normal.     Breath sounds: Normal breath sounds.  Abdominal:     General: Bowel sounds are normal.     Palpations: Abdomen is soft.  Musculoskeletal:        General: No tenderness or deformity. Normal range of motion.     Cervical back: Normal range of motion.  Lymphadenopathy:     Cervical: No cervical adenopathy.  Skin:    General: Skin is warm and dry.     Findings: No erythema or rash.     Comments: Skin exam does show macular lesions.  They are not blanching.  There is no vesicles.  There is no bleeding.  These appear to be more so on the sun exposed areas on her skin.  Neurological:     Mental Status: She is alert and oriented to person, place, and time.  Psychiatric:        Behavior: Behavior normal.        Thought Content: Thought content normal.        Judgment: Judgment normal.     Lab Results  Component Value Date   WBC 7.1 11/08/2022   HGB 12.1 11/08/2022   HCT 36.4 11/08/2022   MCV 95.5 11/08/2022   PLT 138 (L) 11/08/2022   Lab Results  Component Value Date   FERRITIN 944 (H) 10/16/2022   IRON 61 10/16/2022   TIBC 277 10/16/2022   UIBC 216 10/16/2022   IRONPCTSAT 22 10/16/2022   Lab Results  Component Value Date   RETICCTPCT 2.4 11/15/2021   RBC 3.81 (L) 11/08/2022   No results found for: "KPAFRELGTCHN", "LAMBDASER", "KAPLAMBRATIO" No results found for: "IGGSERUM", "IGA", "IGMSERUM" No results found for: "TOTALPROTELP", "ALBUMINELP", "A1GS", "A2GS", "BETS", "BETA2SER", "GAMS", "MSPIKE", "SPEI"   Chemistry      Component Value Date/Time   NA 134 (L) 11/08/2022 0815   K 3.5 11/08/2022 0815   CL 96 (L) 11/08/2022 0815   CO2 28 11/08/2022 0815   BUN 20 11/08/2022 0815   CREATININE 0.75 11/08/2022 0815   CREATININE 0.66 10/26/2022 1154   CREATININE 0.68 11/18/2012 1439      Component Value Date/Time   CALCIUM 8.5 (L) 11/08/2022 0815   ALKPHOS 47 11/08/2022 0815   AST 24 11/08/2022 0815    AST 16 10/26/2022 1154   ALT 65 (H) 11/08/2022 0815   ALT 22 10/26/2022 1154   BILITOT 0.5 11/08/2022 0815   BILITOT 0.5 10/26/2022 1154       Impression and Plan: Ariel [name] is a very pleasant 67 yo caucasian female with iron deficiency anemia and then was diagnosed with diffuse large cell non-Hodgkin's lymphoma that had traveled to her brain.  She now has had another relapse.  She had been on Rituxan/IMBRUVICA.  We went and stopped this.  Again I had to believe that she is responding.  Her overall cognitive level is much higher which is nice to see.  We will go ahead and admit her for a second cycle of Rituxan/high-dose methotrexate on 11/27/2022.  She wants to wait until after next week as she is going to have company come up from [place].  Am just happy that she is able to do what she would like to do right now.  She is getting stronger.  She is eating well.  After this second cycle of treatment, we will then repeat a MRI and see how everything looks.  I still think that we need to try to get her to CAR-T therapy.  I know she has been seen out at [facility].  I think if she has response, we will try to get her back out to [facility].  I will plan to get her back to see [place] depend on when she gets out of the hospital from her second cycle of treatment  .    [name], MD 9/18/202411:08 AM

## 2022-11-16 DIAGNOSIS — M62561 Muscle wasting and atrophy, not elsewhere classified, right lower leg: Secondary | ICD-10-CM | POA: Diagnosis not present

## 2022-11-16 DIAGNOSIS — R2689 Other abnormalities of gait and mobility: Secondary | ICD-10-CM | POA: Diagnosis not present

## 2022-11-16 DIAGNOSIS — M62551 Muscle wasting and atrophy, not elsewhere classified, right thigh: Secondary | ICD-10-CM | POA: Diagnosis not present

## 2022-11-16 DIAGNOSIS — M62552 Muscle wasting and atrophy, not elsewhere classified, left thigh: Secondary | ICD-10-CM | POA: Diagnosis not present

## 2022-11-16 DIAGNOSIS — M62562 Muscle wasting and atrophy, not elsewhere classified, left lower leg: Secondary | ICD-10-CM | POA: Diagnosis not present

## 2022-11-17 ENCOUNTER — Ambulatory Visit: Payer: Medicare Other | Admitting: Hematology & Oncology

## 2022-11-17 ENCOUNTER — Ambulatory Visit: Payer: Medicare Other

## 2022-11-17 ENCOUNTER — Other Ambulatory Visit: Payer: Medicare Other

## 2022-11-17 ENCOUNTER — Encounter (HOSPITAL_COMMUNITY): Payer: Self-pay | Admitting: Hematology & Oncology

## 2022-11-27 ENCOUNTER — Other Ambulatory Visit: Payer: Self-pay

## 2022-11-27 ENCOUNTER — Other Ambulatory Visit: Payer: Self-pay | Admitting: Hematology & Oncology

## 2022-11-27 ENCOUNTER — Encounter (HOSPITAL_COMMUNITY): Payer: Self-pay | Admitting: Hematology & Oncology

## 2022-11-27 ENCOUNTER — Inpatient Hospital Stay (HOSPITAL_COMMUNITY)
Admission: AD | Admit: 2022-11-27 | Discharge: 2022-12-03 | DRG: 842 | Disposition: A | Discharge status: Home / Self Care | Payer: Medicare Other | Attending: Hematology & Oncology | Admitting: Hematology & Oncology

## 2022-11-27 DIAGNOSIS — C8580 Other specified types of non-Hodgkin lymphoma, unspecified site: Secondary | ICD-10-CM | POA: Diagnosis present

## 2022-11-27 DIAGNOSIS — E7800 Pure hypercholesterolemia, unspecified: Secondary | ICD-10-CM | POA: Diagnosis not present

## 2022-11-27 DIAGNOSIS — Z85820 Personal history of malignant melanoma of skin: Secondary | ICD-10-CM

## 2022-11-27 DIAGNOSIS — D509 Iron deficiency anemia, unspecified: Secondary | ICD-10-CM | POA: Diagnosis present

## 2022-11-27 DIAGNOSIS — C8330 Diffuse large B-cell lymphoma, unspecified site: Secondary | ICD-10-CM

## 2022-11-27 DIAGNOSIS — C8510 Unspecified B-cell lymphoma, unspecified site: Principal | ICD-10-CM

## 2022-11-27 DIAGNOSIS — Z825 Family history of asthma and other chronic lower respiratory diseases: Secondary | ICD-10-CM | POA: Diagnosis not present

## 2022-11-27 DIAGNOSIS — Z8249 Family history of ischemic heart disease and other diseases of the circulatory system: Secondary | ICD-10-CM

## 2022-11-27 DIAGNOSIS — R519 Headache, unspecified: Secondary | ICD-10-CM | POA: Diagnosis not present

## 2022-11-27 DIAGNOSIS — Z8673 Personal history of transient ischemic attack (TIA), and cerebral infarction without residual deficits: Secondary | ICD-10-CM | POA: Diagnosis not present

## 2022-11-27 DIAGNOSIS — C8590 Non-Hodgkin lymphoma, unspecified, unspecified site: Secondary | ICD-10-CM | POA: Diagnosis not present

## 2022-11-27 DIAGNOSIS — Z888 Allergy status to other drugs, medicaments and biological substances status: Secondary | ICD-10-CM

## 2022-11-27 DIAGNOSIS — D61818 Other pancytopenia: Secondary | ICD-10-CM

## 2022-11-27 DIAGNOSIS — E876 Hypokalemia: Secondary | ICD-10-CM

## 2022-11-27 DIAGNOSIS — C8332 Diffuse large B-cell lymphoma, intrathoracic lymph nodes: Secondary | ICD-10-CM

## 2022-11-27 DIAGNOSIS — Z0000 Encounter for general adult medical examination without abnormal findings: Secondary | ICD-10-CM | POA: Diagnosis not present

## 2022-11-27 DIAGNOSIS — Z833 Family history of diabetes mellitus: Secondary | ICD-10-CM

## 2022-11-27 DIAGNOSIS — C8300 Small cell B-cell lymphoma, unspecified site: Secondary | ICD-10-CM | POA: Diagnosis not present

## 2022-11-27 DIAGNOSIS — Z882 Allergy status to sulfonamides status: Secondary | ICD-10-CM

## 2022-11-27 DIAGNOSIS — C8339 Primary central nervous system lymphoma: Secondary | ICD-10-CM

## 2022-11-27 DIAGNOSIS — R112 Nausea with vomiting, unspecified: Secondary | ICD-10-CM | POA: Diagnosis not present

## 2022-11-27 DIAGNOSIS — Z811 Family history of alcohol abuse and dependence: Secondary | ICD-10-CM | POA: Diagnosis not present

## 2022-11-27 DIAGNOSIS — Z885 Allergy status to narcotic agent status: Secondary | ICD-10-CM | POA: Diagnosis not present

## 2022-11-27 DIAGNOSIS — C8331 Diffuse large B-cell lymphoma, lymph nodes of head, face, and neck: Secondary | ICD-10-CM

## 2022-11-27 DIAGNOSIS — Z96643 Presence of artificial hip joint, bilateral: Secondary | ICD-10-CM | POA: Diagnosis not present

## 2022-11-27 LAB — COMPREHENSIVE METABOLIC PANEL
ALT: 24 U/L (ref 0–44)
AST: 19 U/L (ref 15–41)
Albumin: 4.2 g/dL (ref 3.5–5.0)
Alkaline Phosphatase: 58 U/L (ref 38–126)
Anion gap: 9 (ref 5–15)
BUN: 12 mg/dL (ref 8–23)
CO2: 23 mmol/L (ref 22–32)
Calcium: 9.3 mg/dL (ref 8.9–10.3)
Chloride: 102 mmol/L (ref 98–111)
Creatinine, Ser: 0.58 mg/dL (ref 0.44–1.00)
GFR, Estimated: >60 mL/min (ref >60)
Glucose, Bld: 123 mg/dL — ABNORMAL HIGH (ref 70–99)
Potassium: 3.4 mmol/L — ABNORMAL LOW (ref 3.5–5.1)
Sodium: 134 mmol/L — ABNORMAL LOW (ref 135–145)
Total Bilirubin: 0.5 mg/dL (ref 0.3–1.2)
Total Protein: 6.7 g/dL (ref 6.5–8.1)

## 2022-11-27 LAB — URINALYSIS, COMPLETE (UACMP) WITH MICROSCOPIC
Bilirubin Urine: NEGATIVE
Glucose, UA: NEGATIVE mg/dL
Hgb urine dipstick: NEGATIVE
Ketones, ur: NEGATIVE mg/dL
Leukocytes,Ua: NEGATIVE
Nitrite: NEGATIVE
Protein, ur: NEGATIVE mg/dL
Specific Gravity, Urine: 1.009 (ref 1.005–1.030)
pH: 6 (ref 5.0–8.0)

## 2022-11-27 LAB — CBC WITH DIFFERENTIAL/PLATELET
Abs Immature Granulocytes: 0.02 10*3/uL (ref 0.00–0.07)
Basophils Absolute: 0 10*3/uL (ref 0.0–0.1)
Basophils Relative: 0 %
Eosinophils Absolute: 0 10*3/uL (ref 0.0–0.5)
Eosinophils Relative: 1 %
HCT: 40.5 % (ref 36.0–46.0)
Hemoglobin: 13.8 g/dL (ref 12.0–15.0)
Immature Granulocytes: 1 %
Lymphocytes Relative: 36 %
Lymphs Abs: 1.1 10*3/uL (ref 0.7–4.0)
MCH: 32.4 pg (ref 26.0–34.0)
MCHC: 34.1 g/dL (ref 30.0–36.0)
MCV: 95.1 fL (ref 80.0–100.0)
Monocytes Absolute: 0.5 10*3/uL (ref 0.1–1.0)
Monocytes Relative: 17 %
Neutro Abs: 1.3 10*3/uL — ABNORMAL LOW (ref 1.7–7.7)
Neutrophils Relative %: 45 %
Platelets: 142 10*3/uL — ABNORMAL LOW (ref 150–400)
RBC: 4.26 MIL/uL (ref 3.87–5.11)
RDW: 13.2 % (ref 11.5–15.5)
WBC: 2.9 10*3/uL — ABNORMAL LOW (ref 4.0–10.5)
nRBC: 0 % (ref 0.0–0.2)

## 2022-11-27 LAB — MAGNESIUM: Magnesium: 2.3 mg/dL (ref 1.7–2.4)

## 2022-11-27 MED ORDER — POTASSIUM CHLORIDE IN NACL 20-0.9 MEQ/L-% IV SOLN
INTRAVENOUS | Status: DC
  Filled 2022-11-27: qty 1000

## 2022-11-27 MED ORDER — ONDANSETRON HCL 4 MG PO TABS: 4.0000 mg | ORAL_TABLET | Freq: Three times a day (TID) | ORAL | Status: DC | PRN

## 2022-11-27 MED ORDER — ACETAMINOPHEN 325 MG PO TABS
650.0000 mg | ORAL_TABLET | Freq: Once | ORAL | Status: AC
  Administered 2022-11-27: 650 mg via ORAL
  Filled 2022-11-27: qty 2

## 2022-11-27 MED ORDER — TEMAZEPAM 7.5 MG PO CAPS: 7.5000 mg | ORAL_CAPSULE | Freq: Every evening | ORAL | Status: DC | PRN

## 2022-11-27 MED ORDER — GUAIFENESIN-DM 100-10 MG/5ML PO SYRP: 10.0000 mL | ORAL_SOLUTION | ORAL | Status: DC | PRN

## 2022-11-27 MED ORDER — SODIUM CHLORIDE 0.9 % IV SOLN
500.0000 mg/m2 | Freq: Once | INTRAVENOUS | Status: AC
  Administered 2022-11-27: 900 mg via INTRAVENOUS
  Filled 2022-11-27: qty 90

## 2022-11-27 MED ORDER — ONDANSETRON 4 MG PO TBDP: 4.0000 mg | ORAL_TABLET | Freq: Three times a day (TID) | ORAL | Status: DC | PRN

## 2022-11-27 MED ORDER — ACETAMINOPHEN 325 MG PO TABS
650.0000 mg | ORAL_TABLET | ORAL | Status: DC | PRN
  Administered 2022-11-28 – 2022-12-02 (×5): 650 mg via ORAL
  Filled 2022-11-27 (×6): qty 2

## 2022-11-27 MED ORDER — SODIUM BICARBONATE/SODIUM CHLORIDE MOUTHWASH
OROMUCOSAL | Status: DC
  Filled 2022-11-27 (×2): qty 1000

## 2022-11-27 MED ORDER — SODIUM CHLORIDE 0.9 % IV SOLN: 8.0000 mg | Freq: Three times a day (TID) | INTRAVENOUS | Status: DC | PRN

## 2022-11-27 MED ORDER — BIOTENE DRY MOUTH MT LIQD
15.0000 mL | Freq: Four times a day (QID) | OROMUCOSAL | Status: DC
  Administered 2022-11-27 – 2022-12-03 (×15): 15 mL via OROMUCOSAL

## 2022-11-27 MED ORDER — ALUM & MAG HYDROXIDE-SIMETH 200-200-20 MG/5ML PO SUSP: 60.0000 mL | ORAL | Status: DC | PRN

## 2022-11-27 MED ORDER — ONDANSETRON 4 MG PO TBDP: 8.0000 mg | ORAL_TABLET | Freq: Three times a day (TID) | ORAL | Status: DC | PRN

## 2022-11-27 MED ORDER — SODIUM CHLORIDE 0.9 % IV SOLN: INTRAVENOUS | Status: DC

## 2022-11-27 MED ORDER — DIPHENHYDRAMINE HCL 25 MG PO CAPS
25.0000 mg | ORAL_CAPSULE | Freq: Once | ORAL | Status: AC
  Administered 2022-11-27: 25 mg via ORAL
  Filled 2022-11-27: qty 1

## 2022-11-27 MED ORDER — ONDANSETRON HCL 4 MG/2ML IJ SOLN: 4.0000 mg | Freq: Three times a day (TID) | INTRAMUSCULAR | Status: DC | PRN

## 2022-11-27 MED ORDER — SODIUM BICARBONATE 8.4 % IV SOLN
INTRAVENOUS | Status: DC
  Filled 2022-11-27 (×6): qty 150

## 2022-11-27 MED ORDER — ENOXAPARIN SODIUM 40 MG/0.4ML IJ SOSY
40.0000 mg | PREFILLED_SYRINGE | INTRAMUSCULAR | Status: DC
  Administered 2022-11-27: 40 mg via SUBCUTANEOUS
  Filled 2022-11-27: qty 0.4

## 2022-11-27 MED ORDER — SENNOSIDES-DOCUSATE SODIUM 8.6-50 MG PO TABS: 1.0000 | ORAL_TABLET | Freq: Every evening | ORAL | Status: DC | PRN

## 2022-11-27 MED ORDER — HYDROCORTISONE (PERIANAL) 2.5 % EX CREA: 1.0000 | TOPICAL_CREAM | Freq: Two times a day (BID) | CUTANEOUS | Status: DC | PRN

## 2022-11-27 MED ORDER — FAMCICLOVIR 500 MG PO TABS
500.0000 mg | ORAL_TABLET | Freq: Every day | ORAL | Status: DC
  Administered 2022-11-27 – 2022-12-03 (×7): 500 mg via ORAL
  Filled 2022-11-27 (×7): qty 1

## 2022-11-27 NOTE — Progress Notes (Addendum)
Subjective:   Patient is a 67 y.o. female admitted on 11/27/2022 for chemotherapy with CNS non Hodgkins lymphoma for Rituxan and methotrexate administration.  Today is day Day 1/Cycle 2 of therapy.   Rituxan 900 mg started at 1627 via port.  Blood return noted.    Pt doing well.     Objective:   Patient Vitals for the past 8 hrs:  BP Temp Temp src Pulse Resp SpO2  11/27/22 1734 129/61 -- -- -- 16 --  11/27/22 1703 (!) 126/59 -- -- 83 -- 98 %  11/27/22 1634 134/65 97.7 F (36.5 C) Oral 80 18 98 %  11/27/22 1352 (!) 114/54 97.8 F (36.6 C) Oral 77 17 100 %  11/27/22 1045 (!) 129/59 97.7 F (36.5 C) Oral 69 17 99 %

## 2022-11-27 NOTE — Progress Notes (Signed)
Dr. Myna Hidalgo would like to hold the sodium bicarbonate long enough to give the rituximab today, then resume the bicarb drip when the rituximab finishes infusing.  Methotrexate will be given tomorrow.  The patient's orders have been changed to reflect this per Dr. Gustavo Lah instructions.

## 2022-11-27 NOTE — Plan of Care (Signed)
  Problem: Education: Goal: Knowledge of risk factors and measures for prevention of condition will improve Outcome: Progressing   Problem: Coping: Goal: Psychosocial and spiritual needs will be supported Outcome: Progressing   Problem: Respiratory: Goal: Will maintain a patent airway Outcome: Progressing Goal: Complications related to the disease process, condition or treatment will be avoided or minimized Outcome: Progressing   Problem: Education: Goal: Knowledge of the prescribed therapeutic regimen will improve Outcome: Progressing   Problem: Activity: Goal: Ability to implement measures to reduce episodes of fatigue will improve Outcome: Progressing   Problem: Bowel/Gastric: Goal: Will not experience complications related to bowel motility Outcome: Progressing   Problem: Coping: Goal: Ability to identify and develop effective coping behavior will improve Outcome: Progressing   Problem: Nutritional: Goal: Maintenance of adequate nutrition will improve Outcome: Progressing   Problem: Education: Goal: Knowledge of General Education information will improve Description: Including pain rating scale, medication(s)/side effects and non-pharmacologic comfort measures Outcome: Progressing   Problem: Health Behavior/Discharge Planning: Goal: Ability to manage health-related needs will improve Outcome: Progressing   Problem: Clinical Measurements: Goal: Ability to maintain clinical measurements within normal limits will improve Outcome: Progressing Goal: Will remain free from infection Outcome: Progressing Goal: Diagnostic test results will improve Outcome: Progressing Goal: Respiratory complications will improve Outcome: Progressing Goal: Cardiovascular complication will be avoided Outcome: Progressing   Problem: Activity: Goal: Risk for activity intolerance will decrease Outcome: Progressing   Problem: Nutrition: Goal: Adequate nutrition will be  maintained Outcome: Progressing   Problem: Coping: Goal: Level of anxiety will decrease Outcome: Progressing   Problem: Elimination: Goal: Will not experience complications related to bowel motility Outcome: Progressing Goal: Will not experience complications related to urinary retention Outcome: Progressing   Problem: Pain Managment: Goal: General experience of comfort will improve Outcome: Progressing   Problem: Safety: Goal: Ability to remain free from injury will improve Outcome: Progressing   Problem: Skin Integrity: Goal: Risk for impaired skin integrity will decrease Outcome: Progressing

## 2022-11-27 NOTE — H&P (Signed)
Referral MD  Reason for Referral: Admission for cycle #2 of high-dose methotrexate.  No chief complaint on file. : On a chemotherapy for mild lymphoma.  HPI: Ms. Gerstenberger is a very charming 67 year old white female.  She has recurrent CNS non-Hodgkin's lymphoma.  She had her first cycle of high-dose methotrexate about 3 weeks ago.  She tolerated this quite well.  She does not complain of any headache.  Her appetite is great.  She has had no visual changes.  She has had no achiness in her joints.  She has had no cough or shortness of breath.  She has had no nausea or vomiting.  There has been no bleeding.  She has had no fever.  She has been on prophylactic antibiotic with Famvir.  I think this is helped her.  She does have a history of iron deficiency anemia but again this has not been a problem for her.  She now is admitted for cycle #2 of treatment.  Overall, I would say her performance status is probably ECOG 1.    Past Medical History:  Diagnosis Date   Allergy    Arthritis    Cataract    Chicken pox    Complication of anesthesia    Per patient, very slow to wake up after anesthesia   Goals of care, counseling/discussion 12/22/2020   High cholesterol    High grade B-cell lymphoma (HCC) 12/22/2020   Hyperglycemia 11/10/2020   Melanoma (HCC) 2021   right hip   Melanoma (HCC) 10/28/2020   pt stated brain liver and bladder   PONV (postoperative nausea and vomiting)    Stroke [facility])    Urinary incontinence   :   Past Surgical History:  Procedure Laterality Date   AIR/FLUID EXCHANGE Right 12/07/2020   Procedure: AIR/FLUID EXCHANGE;  Surgeon: Carmela Rima, MD;  Location: [facility] OR;  Service: Ophthalmology;  Laterality: Right;   APPENDECTOMY  1962   BIOPSY  12/14/2020   Procedure: BIOPSY;  Surgeon: Meridee Score Netty Starring., MD;  Location: [facility] ENDOSCOPY;  Service: Gastroenterology;;   ESOPHAGOGASTRODUODENOSCOPY (EGD) WITH PROPOFOL N/A 12/14/2020   Procedure:  ESOPHAGOGASTRODUODENOSCOPY (EGD) WITH PROPOFOL;  Surgeon: Lemar Lofty., MD;  Location: [facility] ENDOSCOPY;  Service: Gastroenterology;  Laterality: N/A;   EXCISION MELANOMA WITH SENTINEL LYMPH NODE BIOPSY Right 10/09/2019   Procedure: WIDE LOCAL EXCISION WITH ADVANCEMENT FLAP CLOSURE RIGHT HIP MELANOMA WITH SENTINEL LYMPH NODE BIOPSY;  Surgeon: Almond Lint, MD;  Location: [facility] OR;  Service: General;  Laterality: Right;   EYE SURGERY     FINE NEEDLE ASPIRATION  12/14/2020   Procedure: FINE NEEDLE ASPIRATION (FNA) LINEAR;  Surgeon: Lemar Lofty., MD;  Location: [facility] ENDOSCOPY;  Service: Gastroenterology;;   HERNIA REPAIR  2009   IR BONE MARROW BIOPSY & ASPIRATION  05/08/2022   IR IMAGING GUIDED PORT INSERTION  12/30/2020   JOINT REPLACEMENT  2023   MOUTH SURGERY  11/2015   gum surgery and bone implant   PARS PLANA VITRECTOMY Right 12/07/2020   Procedure: PARS PLANA VITRECTOMY WITH 25 GAUGE;  Surgeon: Carmela Rima, MD;  Location: [facility] OR;  Service: Ophthalmology;  Laterality: Right;   PHOTOCOAGULATION WITH LASER Right 12/07/2020   Procedure: PHOTOCOAGULATION WITH ENDOLASER PANRITINAL COAGULATION;  Surgeon: Carmela Rima, MD;  Location: [facility] OR;  Service: Ophthalmology;  Laterality: Right;   TOTAL HIP ARTHROPLASTY Left 10/11/2021   Procedure: LEFT TOTAL HIP ARTHROPLASTY ANTERIOR APPROACH;  Surgeon: Marcene Corning, MD;  Location: [facility] ORS;  Service: Orthopedics;  Laterality: Left;   TOTAL  HIP ARTHROPLASTY Right 01/10/2022   Procedure: RIGHT TOTAL HIP ARTHROPLASTY ANTERIOR APPROACH;  Surgeon: Marcene Corning, MD;  Location: [facility] ORS;  Service: Orthopedics;  Laterality: Right;   UPPER ESOPHAGEAL ENDOSCOPIC ULTRASOUND (EUS) N/A 12/14/2020   Procedure: UPPER ESOPHAGEAL ENDOSCOPIC ULTRASOUND (EUS);  Surgeon: Lemar Lofty., MD;  Location: [name] ENDOSCOPY;  Service: Gastroenterology;  Laterality: N/A;   WISDOM TOOTH EXTRACTION    :   Current Facility-Administered Medications:    0.9  %  sodium chloride infusion, , Intravenous, Continuous, Sisto Granillo, Rose Phi, MD, Last Rate: 20 mL/hr at 11/27/22 1619, New Bag at 11/27/22 1619   acetaminophen (TYLENOL) tablet 650 mg, 650 mg, Oral, Q4H PRN, Keyah Blizard, Rose Phi, MD   alum & mag hydroxide-simeth (MAALOX/MYLANTA) 200-200-20 MG/5ML suspension 60 mL, 60 mL, Oral, Q4H PRN, [name], MD   famciclovir [facility]) tablet 500 mg, 500 mg, Oral, Daily, Arrietty Dercole, Rose Phi, MD, 500 mg at 11/27/22 1141   guaiFENesin-dextromethorphan (ROBITUSSIN DM) 100-10 MG/5ML syrup 10 mL, 10 mL, Oral, Q4H PRN, Antonis Lor, Rose Phi, MD   hydrocortisone (ANUSOL-HC) 2.5 % rectal cream 1 Application, 1 Application, Rectal, BID PRN, [name], MD   ondansetron (ZOFRAN) tablet 4-8 mg, 4-8 mg, Oral, Q8H PRN **OR** ondansetron (ZOFRAN-ODT) disintegrating tablet 4-8 mg, 4-8 mg, Oral, Q8H PRN **OR** ondansetron (ZOFRAN) injection 4 mg, 4 mg, Intravenous, Q8H PRN **OR** ondansetron (ZOFRAN) 8 mg in sodium chloride 0.9 % 50 mL IVPB, 8 mg, Intravenous, Q8H PRN, Md Smola, Rose Phi, MD   senna-docusate (Senokot-S) tablet 1 tablet, 1 tablet, Oral, QHS PRN, [name], MD   sodium bicarbonate 150 mEq in dextrose 5 % 1,150 mL infusion, , Intravenous, Continuous, Maico Mulvehill, Rose Phi, MD, Stopped at 11/27/22 1619  Facility-Administered Medications Ordered in Other Encounters:    sodium chloride flush (NS) 0.9 % injection 10 mL, 10 mL, Intravenous, PRN, [name], MD, 10 mL at 10/04/21 0946:   famciclovir  500 mg Oral Daily  :   Allergies  Allergen Reactions   Decadron [Dexamethasone] Other (See Comments)    Leg weakness Hallucinations   Plavix [Clopidogrel] Hives, Swelling and Other (See Comments)    Lip swelling    Prednisone Other (See Comments)    Steroids caused psychological issues  Hallucinations   Sulfa Antibiotics Itching and Rash   Codeine Nausea And Vomiting  :   Family History  Problem Relation Age of Onset   Coronary artery disease Father         died at 5   COPD Father    Alcohol abuse Father    Diabetes Mellitus II Father    Coronary artery disease Sister    Cancer Sister        breast   Heart attack Brother    Hyperlipidemia Other        died 14- "natural causes"   Hyperlipidemia Other        4 siblings    Hypertension Other        3 siblings  :   Social History   Socioeconomic History   Marital status: Widowed    Spouse name: Not on file   Number of children: Not on file   Years of education: Not on file   Highest education level: Not on file  Occupational History   Occupation: retired  Tobacco Use   Smoking status: Never   Smokeless tobacco: Never  Vaping Use   Vaping status: Never Used  Substance and Sexual Activity   Alcohol use:  Never   Drug use: Never   Sexual activity: Not Currently  Other Topics Concern   Not on file  Social History Narrative   05/09/2021   Presently living at [facility] in [place].   One Son- lives locally, 1 grand[place]daughter[place]aughter- 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   Right handed   Home one level    Caffeine 1-cup   Social Determinants of Health   Financial Resource Strain: Low Risk  (09/05/2022)   Overall Financial Resource Strain (CARDIA)    Difficulty of Paying Living Expenses: Not hard at all  Food Insecurity: No Food Insecurity (11/27/2022)   Hunger Vital Sign    Worried About Running Out of Food in the Last Year: Never true    Ran Out of Food in the Last Year: Never true  Transportation Needs: No Transportation Needs (11/27/2022)   PRAPARE - Administrator, Civil Service (Medical): No    Lack of Transportation (Non-Medical): No  Physical Activity: Sufficiently Active (09/05/2022)   Exercise Vital Sign    Days of Exercise per Week: 6 days    Minutes of Exercise per Session: 40 min  Stress: No Stress Concern Present (09/05/2022)   [employer] of Occupational Health - Occupational Stress Questionnaire     Feeling of Stress : Not at all  Social Connections: Moderately Isolated (09/05/2022)   Social Connection and Isolation Panel [NHANES]    Frequency of Communication with Friends and Family: More than three times a week    Frequency of Social Gatherings with Friends and Family: More than three times a week    Attends Religious Services: More than 4 times per year    Active Member of [employer] or Organizations: No    Attends Banker Meetings: Never    Marital Status: Widowed  Intimate Partner Violence: Not At Risk (11/27/2022)   Humiliation, Afraid, Rape, and Kick questionnaire    Fear of Current or Ex-Partner: No    Emotionally Abused: No    Physically Abused: No    Sexually Abused: No  :  Review of Systems  Constitutional: Negative.   HENT: Negative.    Eyes: Negative.   Respiratory: Negative.    Cardiovascular: Negative.   Gastrointestinal: Negative.   Genitourinary: Negative.   Musculoskeletal: Negative.   Skin: Negative.   Endo/Heme/Allergies: Negative.   Psychiatric/Behavioral: Negative.       Exam: Patient Vitals for the past 24 hrs:  BP Temp Temp src Pulse Resp SpO2  11/27/22 1734 129/61 -- -- -- 16 --  11/27/22 1703 (!) 126/59 -- -- 83 -- 98 %  11/27/22 1634 134/65 97.7 F (36.5 C) Oral 80 18 98 %  11/27/22 1352 (!) 114/54 97.8 F (36.6 C) Oral 77 17 100 %  11/27/22 1045 (!) 129/59 97.7 F (36.5 C) Oral 69 17 99 %   Physical Exam Vitals reviewed.  HENT:     Head: Normocephalic and atraumatic.  Eyes:     Pupils: Pupils are equal, round, and reactive to light.  Cardiovascular:     Rate and Rhythm: Normal rate and regular rhythm.     Heart sounds: Normal heart sounds.  Pulmonary:     Effort: Pulmonary effort is normal.     Breath sounds: Normal breath sounds.  Abdominal:     General: Bowel sounds are normal.     Palpations: Abdomen is soft.  Musculoskeletal:        General: No tenderness or deformity. Normal range  of motion.     Cervical back:  Normal range of motion.  Lymphadenopathy:     Cervical: No cervical adenopathy.  Skin:    General: Skin is warm and dry.     Findings: No erythema or rash.  Neurological:     Mental Status: She is alert and oriented to person, place, and time.  Psychiatric:        Behavior: Behavior normal.        Thought Content: Thought content normal.        Judgment: Judgment normal.     Recent Labs    11/27/22 1206  WBC 2.9*  HGB 13.8  HCT 40.5  PLT 142*    Recent Labs    11/27/22 1206  NA 134*  K 3.4*  CL 102  CO2 23  GLUCOSE 123*  BUN 12  CREATININE 0.58  CALCIUM 9.3    Blood smear review: None  Pathology: None    Assessment and Plan: Ms. Sperl is a very nice 67 year old white female.  She has recurrence of her CNS non-Hodgkin's lymphoma.  Patient we will go ahead and get her admitted for cycle #2.  She will get her Rituxan initially.  We will alkalize her urine.  I know that Pharmacy does a really great job in helping [place] out with the methotrexate dosing and with the leucovorin dosing after she gets methotrexate.  Again, we will have to make sure she gets mouth rinses.  I noted that her white cell count was a little little on the lower side.  We will have to watch this closely.  We will also have to watch her on steroids.  I know that sometimes steroids can cause her to have a little bit of psychosis.  Again, we have to watch this closely.  I suspect that she probably will be in the hospital for about 5 or 6 days.  This is the typical amount of time for her in the hospital.  Thankfully, she tends to clear out the methotrexate pretty quickly.  She typically does get elevated LFTs after the methotrexate.  I do come down.  We will check a urine pH on her daily.  Hopefully, we will be able to get her Rituxan started today.  Once her urine pH gets above 8, then we can get the methotrexate going.  As always, the staff on 6 [address]. will do a fantastic job with her.   Christin Bach, MD  Psalm 41:3

## 2022-11-27 NOTE — Progress Notes (Signed)
Draw a urine pH this evening, if pH is low pharmacy to increase rate of sodium bicarbonate drip from 50 mL/hr to 100 mL/hr per Dr. Myna Hidalgo.

## 2022-11-27 NOTE — Progress Notes (Signed)
11 yoF admitted for inpatient high-dose methotrexate chemotherapy, ordered bicarb infusion to raise urine pH >/= 7 prior to methotrexate treatment.  Pharmacy instructed by Oncology to check urine pH in evening and adjust bicarb rate as below. However, pt also receiving rituximab, which is incompatible with bicarb and MD did not wish to place any addition IV access. Bicarb infusion paused 4.5 hrs, from 1630 to 2100 tonight. Will need at least 6 hrs of bicarb infusion to reflect steady state effect, which will necessitate pushing the planned UA to tomorrow AM. RN aware.  Bernadene Person, PharmD, BCPS [phone] 11/27/2022, 9:09 PM

## 2022-11-28 DIAGNOSIS — C8580 Other specified types of non-Hodgkin lymphoma, unspecified site: Secondary | ICD-10-CM | POA: Diagnosis not present

## 2022-11-28 LAB — COMPREHENSIVE METABOLIC PANEL
ALT: 19 U/L (ref 0–44)
AST: 16 U/L (ref 15–41)
Albumin: 3.4 g/dL — ABNORMAL LOW (ref 3.5–5.0)
Alkaline Phosphatase: 46 U/L (ref 38–126)
Anion gap: 7 (ref 5–15)
BUN: 11 mg/dL (ref 8–23)
CO2: 27 mmol/L (ref 22–32)
Calcium: 9.1 mg/dL (ref 8.9–10.3)
Chloride: 102 mmol/L (ref 98–111)
Creatinine, Ser: 0.6 mg/dL (ref 0.44–1.00)
GFR, Estimated: >60 mL/min (ref >60)
Glucose, Bld: 110 mg/dL — ABNORMAL HIGH (ref 70–99)
Potassium: 3.8 mmol/L (ref 3.5–5.1)
Sodium: 136 mmol/L (ref 135–145)
Total Bilirubin: 0.4 mg/dL (ref 0.3–1.2)
Total Protein: 5.5 g/dL — ABNORMAL LOW (ref 6.5–8.1)

## 2022-11-28 LAB — CBC WITH DIFFERENTIAL/PLATELET
Abs Immature Granulocytes: 0.04 10*3/uL (ref 0.00–0.07)
Basophils Absolute: 0 10*3/uL (ref 0.0–0.1)
Basophils Relative: 0 %
Eosinophils Absolute: 0.1 10*3/uL (ref 0.0–0.5)
Eosinophils Relative: 3 %
HCT: 32.7 % — ABNORMAL LOW (ref 36.0–46.0)
Hemoglobin: 10.9 g/dL — ABNORMAL LOW (ref 12.0–15.0)
Immature Granulocytes: 1 %
Lymphocytes Relative: 29 %
Lymphs Abs: 0.9 10*3/uL (ref 0.7–4.0)
MCH: 31.7 pg (ref 26.0–34.0)
MCHC: 33.3 g/dL (ref 30.0–36.0)
MCV: 95.1 fL (ref 80.0–100.0)
Monocytes Absolute: 0.6 10*3/uL (ref 0.1–1.0)
Monocytes Relative: 20 %
Neutro Abs: 1.5 10*3/uL — ABNORMAL LOW (ref 1.7–7.7)
Neutrophils Relative %: 47 %
Platelets: 162 10*3/uL (ref 150–400)
RBC: 3.44 MIL/uL — ABNORMAL LOW (ref 3.87–5.11)
RDW: 13.2 % (ref 11.5–15.5)
WBC: 3.1 10*3/uL — ABNORMAL LOW (ref 4.0–10.5)
nRBC: 0 % (ref 0.0–0.2)

## 2022-11-28 LAB — URINALYSIS, COMPLETE (UACMP) WITH MICROSCOPIC
Bacteria, UA: NONE SEEN
Bilirubin Urine: NEGATIVE
Glucose, UA: NEGATIVE mg/dL
Hgb urine dipstick: NEGATIVE
Ketones, ur: NEGATIVE mg/dL
Leukocytes,Ua: NEGATIVE
Nitrite: NEGATIVE
Protein, ur: NEGATIVE mg/dL
Specific Gravity, Urine: 1.011 (ref 1.005–1.030)
pH: 8 (ref 5.0–8.0)

## 2022-11-28 LAB — LACTATE DEHYDROGENASE: LDH: 126 U/L (ref 98–192)

## 2022-11-28 MED ORDER — ENOXAPARIN SODIUM 40 MG/0.4ML IJ SOSY
40.0000 mg | PREFILLED_SYRINGE | INTRAMUSCULAR | Status: DC
  Administered 2022-11-28 – 2022-12-02 (×5): 40 mg via SUBCUTANEOUS
  Filled 2022-11-28 (×5): qty 0.4

## 2022-11-28 MED ORDER — SODIUM CHLORIDE 0.9 % IV SOLN: INTRAVENOUS | Status: DC

## 2022-11-28 MED ORDER — CHLORHEXIDINE GLUCONATE CLOTH 2 % EX PADS
6.0000 | MEDICATED_PAD | Freq: Every day | CUTANEOUS | Status: DC
  Administered 2022-11-28 – 2022-12-02 (×5): 6 via TOPICAL

## 2022-11-28 MED ORDER — SODIUM CHLORIDE 0.9 % IV SOLN
4.0000 g/m2 | Freq: Once | INTRAVENOUS | Status: AC
  Administered 2022-11-28: 7.2 g via INTRAVENOUS
  Filled 2022-11-28: qty 288

## 2022-11-28 MED ORDER — SODIUM CHLORIDE 0.9 % IV SOLN
Freq: Once | INTRAVENOUS | Status: AC
  Administered 2022-11-28: 16 mg via INTRAVENOUS
  Filled 2022-11-28: qty 8

## 2022-11-28 NOTE — Progress Notes (Signed)
Pharmacy: Lovenox for VTE prophylaxis  Patient is a 67 y.o F with CNS lymphoma who was admitted to [facility] on 11/27/22 for chemotherapy treatment. Pharmacy has been consulted on 11/28/22 to dose lovenox for VTE pxx.   Today, 11/28/2022: - hgb 10.9, plts 162K - scr 0.60 (crcl~65)  Plan: - lovenox 40 mg SQ q24h - Pharmacy will sign off for lovenox consult.  Reconsult [place] if need further assistance.  Dorna Leitz, PharmD, BCPS 11/28/2022 8:23 AM

## 2022-11-28 NOTE — Progress Notes (Signed)
So far, erythema going pretty well.  She had a Rituxan yesterday.  Her urine pH is 8 today.  Will go ahead with the methotrexate..  She really looks great.  I had to leave that she is responding.  She a little of a headache last night but this got better with Tylenol.  She has had no issues with fever.  There is no visual changes.  She has had no mouth sores.  She has had no cough or shortness of breath.  She has had no nausea or vomiting.  There has been no change in bowel or bladder habits.  She has had no hip pain.  Is been no leg weakness.  Her labs shows sodium 136.  Potassium 3.8.  BUN 11 creatinine 0.6.  Calcium 9.1 with an albumin of 3.4.  Her white count is 3.1.  Hemoglobin 10.9.  Platelet count 162,000.  Her monocytes are 20%.  As such, I suspect that her white cell count should trend upward.  Her vital signs show temperature of 97.9.  Pulse 69.  Blood pressure 124/57.  Head and neck exam shows no ocular or oral lesions.  She has no mucositis.  Lungs are clear bilaterally.  Cardiac exam regular rate and rhythm.  Abdomen is soft.  Bowel sounds are present.  There is no fluid wave.  There is no guarding or rebound tenderness.  Extremity shows no clubbing, cyanosis or edema.  Neurological exam shows no focal neurological deficits. \   We will go ahead with her methotrexate today.  Her labs are okay.  The urine pH is fine.  We will continue to monitor her labs.  She will start the leucovorin tomorrow.  I know that Pharmacy will really help [place] out with this.  They are so good I doing this.  I appreciate everybody's help on 6 E.  Christin Bach, MD  Psalm 18:2

## 2022-11-28 NOTE — Plan of Care (Signed)
  Problem: Activity: Goal: Ability to implement measures to reduce episodes of fatigue will improve Outcome: Progressing   Problem: Bowel/Gastric: Goal: Will not experience complications related to bowel motility Outcome: Progressing   Problem: Coping: Goal: Ability to identify and develop effective coping behavior will improve Outcome: Progressing   Problem: Nutritional: Goal: Maintenance of adequate nutrition will improve Outcome: Progressing   Problem: Clinical Measurements: Goal: Will remain free from infection Outcome: Progressing   Problem: Activity: Goal: Risk for activity intolerance will decrease Outcome: Progressing   Problem: Nutrition: Goal: Adequate nutrition will be maintained Outcome: Progressing   Problem: Coping: Goal: Level of anxiety will decrease Outcome: Progressing   Problem: Elimination: Goal: Will not experience complications related to bowel motility Outcome: Progressing Goal: Will not experience complications related to urinary retention Outcome: Progressing

## 2022-11-29 ENCOUNTER — Other Ambulatory Visit: Payer: Self-pay

## 2022-11-29 ENCOUNTER — Encounter (HOSPITAL_COMMUNITY): Payer: Self-pay | Admitting: Hematology & Oncology

## 2022-11-29 DIAGNOSIS — C8580 Other specified types of non-Hodgkin lymphoma, unspecified site: Secondary | ICD-10-CM | POA: Diagnosis not present

## 2022-11-29 LAB — COMPREHENSIVE METABOLIC PANEL
ALT: 36 U/L (ref 0–44)
AST: 27 U/L (ref 15–41)
Albumin: 3.6 g/dL (ref 3.5–5.0)
Alkaline Phosphatase: 49 U/L (ref 38–126)
Anion gap: 9 (ref 5–15)
BUN: 12 mg/dL (ref 8–23)
CO2: 27 mmol/L (ref 22–32)
Calcium: 8.8 mg/dL — ABNORMAL LOW (ref 8.9–10.3)
Chloride: 98 mmol/L (ref 98–111)
Creatinine, Ser: 0.7 mg/dL (ref 0.44–1.00)
GFR, Estimated: >60 mL/min (ref >60)
Glucose, Bld: 101 mg/dL — ABNORMAL HIGH (ref 70–99)
Potassium: 3.5 mmol/L (ref 3.5–5.1)
Sodium: 134 mmol/L — ABNORMAL LOW (ref 135–145)
Total Bilirubin: 0.4 mg/dL (ref 0.3–1.2)
Total Protein: 5.8 g/dL — ABNORMAL LOW (ref 6.5–8.1)

## 2022-11-29 LAB — URINALYSIS, COMPLETE (UACMP) WITH MICROSCOPIC
Bacteria, UA: NONE SEEN
Bilirubin Urine: NEGATIVE
Glucose, UA: NEGATIVE mg/dL
Hgb urine dipstick: NEGATIVE
Ketones, ur: NEGATIVE mg/dL
Leukocytes,Ua: NEGATIVE
Nitrite: NEGATIVE
Protein, ur: NEGATIVE mg/dL
Specific Gravity, Urine: 1.006 (ref 1.005–1.030)
pH: 8 (ref 5.0–8.0)

## 2022-11-29 LAB — CBC WITH DIFFERENTIAL/PLATELET
Abs Immature Granulocytes: 0.01 10*3/uL (ref 0.00–0.07)
Basophils Absolute: 0 10*3/uL (ref 0.0–0.1)
Basophils Relative: 1 %
Eosinophils Absolute: 0.1 10*3/uL (ref 0.0–0.5)
Eosinophils Relative: 3 %
HCT: 33.6 % — ABNORMAL LOW (ref 36.0–46.0)
Hemoglobin: 11.5 g/dL — ABNORMAL LOW (ref 12.0–15.0)
Immature Granulocytes: 0 %
Lymphocytes Relative: 33 %
Lymphs Abs: 0.8 10*3/uL (ref 0.7–4.0)
MCH: 31.9 pg (ref 26.0–34.0)
MCHC: 34.2 g/dL (ref 30.0–36.0)
MCV: 93.1 fL (ref 80.0–100.0)
Monocytes Absolute: 0.5 10*3/uL (ref 0.1–1.0)
Monocytes Relative: 18 %
Neutro Abs: 1.2 10*3/uL — ABNORMAL LOW (ref 1.7–7.7)
Neutrophils Relative %: 45 %
Platelets: 143 10*3/uL — ABNORMAL LOW (ref 150–400)
RBC: 3.61 MIL/uL — ABNORMAL LOW (ref 3.87–5.11)
RDW: 12.9 % (ref 11.5–15.5)
WBC: 2.5 10*3/uL — ABNORMAL LOW (ref 4.0–10.5)
nRBC: 0 % (ref 0.0–0.2)

## 2022-11-29 MED ORDER — SODIUM CHLORIDE 0.9 % IV SOLN
150.0000 mg | Freq: Once | INTRAVENOUS | Status: DC
  Filled 2022-11-29: qty 5

## 2022-11-29 MED ORDER — SODIUM CHLORIDE 0.9 % IV SOLN
40.0000 mg | Freq: Two times a day (BID) | INTRAVENOUS | Status: AC
  Administered 2022-11-29 – 2022-12-02 (×8): 40 mg via INTRAVENOUS
  Filled 2022-11-29 (×8): qty 4

## 2022-11-29 MED ORDER — PROCHLORPERAZINE EDISYLATE 10 MG/2ML IJ SOLN
10.0000 mg | Freq: Four times a day (QID) | INTRAMUSCULAR | Status: DC | PRN
  Administered 2022-11-30 – 2022-12-01 (×3): 10 mg via INTRAVENOUS
  Filled 2022-11-29 (×3): qty 2

## 2022-11-29 MED ORDER — FILGRASTIM 480 MCG/1.6ML IJ SOLN
480.0000 ug | Freq: Once | INTRAMUSCULAR | Status: AC
  Administered 2022-11-29: 480 ug via SUBCUTANEOUS
  Filled 2022-11-29: qty 1.6

## 2022-11-29 MED ORDER — LEUCOVORIN CALCIUM INJECTION 100 MG
15.0000 mg | Freq: Four times a day (QID) | INTRAMUSCULAR | Status: AC
  Administered 2022-11-29 – 2022-11-30 (×4): 16 mg via INTRAVENOUS
  Filled 2022-11-29 (×7): qty 0.8

## 2022-11-29 NOTE — Progress Notes (Signed)
She has completed cycle 2 of Rituxan/high-dose methotrexate.  She has done well so far.  She will start the leucovorin rescue today.  We will have to see what her methotrexate levels are.  I know that Pharmacy has had a great job in the past and managing this for his.  I will go ahead and get her some Emend.  She has had a little bit of nausea.  I will make sure she is on Pepcid to try to help with any stomach acid.  I will have her on some Compazine as needed.  She is rinse her mouth out vigorously.  She has had no bleeding.  Is been no headache.  Is been no diarrhea.  Her CBC shows white count 2.5.  Hemoglobin 11.5.  Platelet count 143,000.  I will go ahead and give her a dose of Neupogen today.  Her sodium 134.  Potassium 3.5.  BUN 12 creatinine 0.7.  Calcium 8.8 with an albumin of 3.6.  She is not having any cough or shortness of breath.  There is no mouth sores.  She has had no joint aches or pains.  Her hips feel okay right now.  There is no leg swelling.  Her vital signs are temperature 97.6.  Pulse 71.  Blood pressure 126/57.  Her head neck exam shows no ocular or oral lesions.  There are no palpable cervical or supraclavicular lymph nodes.  Lungs are clear and bilaterally.  Cardiac exam regular rate and rhythm.  Abdomen is soft.  Bowel sounds are present.  There is no fluid wave.  There is no palpable liver or spleen tip.  Back exam shows no tenderness over the spine, rubs or hips.  Neurological exam is nonfocal.  Ms. Friedlander is status post 2 cycles of Rituxan/high-dose methotrexate.  I will get a MRI of her brain probably about 2 weeks or so.  Now, we have to let the methotrexate get out of her system.  It would not surprise me if her LFTs go up.  We will have her start her leucovorin.  Again, Pharmacy has a great job in helping to manage the methotrexate levels and leucovorin dose.  I really appreciate your help.  The staff on 6E are doing a great job.  She is very happy with the care  that she is getting.  Hopefully, if her methotrexate levels go down as a typically have in the past, we might be able to get around this weekend.   Christin Bach, MD  [name] 17:14

## 2022-11-29 NOTE — Plan of Care (Signed)
  Problem: Education: Goal: Knowledge of risk factors and measures for prevention of condition will improve Outcome: Progressing   Problem: Coping: Goal: Psychosocial and spiritual needs will be supported Outcome: Progressing   Problem: Respiratory: Goal: Will maintain a patent airway Outcome: Progressing Goal: Complications related to the disease process, condition or treatment will be avoided or minimized Outcome: Progressing   

## 2022-11-29 NOTE — Progress Notes (Signed)
Mobility Specialist - Progress Note   11/29/22 1023  Mobility  Activity Ambulated independently in hallway  Level of Assistance Independent  Assistive Device None  Distance Ambulated (ft) 500 ft  Activity Response Tolerated well  Mobility Referral Yes  $Mobility charge 1 Mobility  Mobility Specialist Start Time (ACUTE ONLY) 1014  Mobility Specialist Stop Time (ACUTE ONLY) 1021  Mobility Specialist Time Calculation (min) (ACUTE ONLY) 7 min   Pt received in bed and agreeable to mobility. No complaints during session. Pt to bed after session with all needs met.    [facility]

## 2022-11-29 NOTE — TOC Initial Note (Addendum)
Transition of Care [facility]) - Initial/Assessment Note    Patient Details  Name: Ariel [name] MRN: [id number] Date of Birth: Sep 01, 1955  [facility]) CM/SW Contact:    Harriett Sine, RN Phone [phone]  11/29/2022, 1:08 PM  Clinical Narrative:                 Spoke to pt at bedside about [facility], with plans to return after hospital stay. Pt has pcp, No SDOH needs at this time, TOC following        Patient Goals and CMS Choice            Expected Discharge Plan and Services                                              Prior Living Arrangements/Services                       Activities of Daily Living   ADL Screening (condition at time of admission) Independently performs ADLs?: Yes (appropriate for developmental age) Does the patient have a NEW difficulty with bathing/dressing/toileting/self-feeding that is expected to last >3 days?: No Does the patient have a NEW difficulty with getting in/out of bed, walking, or climbing stairs that is expected to last >3 days?: No Does the patient have a NEW difficulty with communication that is expected to last >3 days?: No Is the patient deaf or have difficulty hearing?: Yes Does the patient have difficulty seeing, even when wearing glasses/contacts?: No Does the patient have difficulty concentrating, remembering, or making decisions?: No  Permission Sought/Granted                  Emotional Assessment              Admission diagnosis:  Lymphoma malignant, large cell (HCC) [C85.80] Patient Active Problem List   Diagnosis Date Noted   Lymphoma malignant, large cell (HCC) 11/27/2022   UTI (urinary tract infection) 10/30/2022   Ataxia 10/29/2022   Leukocytosis 04/14/2022   Thrombocytosis, unspecified 04/14/2022   Epistaxis 04/08/2022   COVID-19 virus infection 04/05/2022   Hypokalemia 04/05/2022   Hypophosphatemia 04/05/2022   SIADH (syndrome of inappropriate ADH  production) (HCC) 04/05/2022   Protein-calorie malnutrition, moderate (HCC) 03/28/2022   Palliative care by specialist 03/23/2022   Aphasia 03/17/2022   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   Primary localized osteoarthritis of right hip 01/10/2022   Primary localized osteoarthritis of left hip 10/11/2021   Preoperative clearance 09/13/2021   Encounter for screening mammogram for malignant neoplasm of breast 09/13/2021   Pancytopenia (HCC) 05/16/2021   Lymphoma malignant, 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   Diffuse large B cell lymphoma (HCC) 03/07/2021   High grade B-cell lymphoma (HCC) 12/22/2020   Goals of care, counseling/discussion 12/22/2020   Malignant neoplasm metastatic to brain (HCC) 12/09/2020   Brain tumor (HCC) 12/08/2020   Night sweat 11/30/2020   Complication of anesthesia 11/22/2020   PONV (postoperative nausea and vomiting) 11/22/2020   Hyperglycemia 11/10/2020   TIA (transient ischemic attack) 10/07/2020   Iron deficiency anemia 10/07/2020   Melanoma (HCC) 2021   Multinodular goiter 12/22/2013   Subclinical hyperthyroidism 12/19/2013   Urinary incontinence 11/19/2013   Hyperlipidemia 11/19/2012   Routine general medical examination at a health care facility 11/18/2012   PCP:  Sandford Craze, NP Pharmacy:   [facility] -  [facility] [address] [place] [address] Phone: [phone] Fax: [phone]  [facility] [place], [place] - [address] [place] [address] Phone: [phone] Fax: [phone]  [facility] - [place], [place] - [address] [place] [address] Phone: [phone] Fax: [phone]     Social Determinants of Health (SDOH) Social History: SDOH Screenings   Food Insecurity: No Food Insecurity (11/27/2022)  Housing: Low Risk  (11/27/2022)  Transportation Needs: No Transportation Needs (11/27/2022)  Utilities: Not At Risk (11/27/2022)   Alcohol Screen: Low Risk  (09/05/2022)  Depression (PHQ2-9): Low Risk  (09/06/2022)  Financial Resource Strain: Low Risk  (09/05/2022)  Physical Activity: Sufficiently Active (09/05/2022)  Social Connections: Moderately Isolated (09/05/2022)  Stress: No Stress Concern Present (09/05/2022)  Tobacco Use: Low Risk  (11/27/2022)   SDOH Interventions:     Readmission Risk Interventions    10/30/2022    1:29 PM 04/14/2022   10:59 AM 03/27/2022    3:00 PM  Readmission Risk Prevention Plan  Transportation Screening Complete Complete Complete  PCP or Specialist Appt within 5-7 Days Complete    Home Care Screening Complete    Medication Review (RN CM) Complete    Medication Review Oceanographer)  Complete Complete  PCP or Specialist appointment within 3-5 days of discharge  Complete Complete  HRI or Home Care Consult  Complete Complete  SW Recovery Care/Counseling Consult   Complete  Palliative Care Screening   Complete  Skilled Nursing Facility   Complete

## 2022-11-30 DIAGNOSIS — C8580 Other specified types of non-Hodgkin lymphoma, unspecified site: Secondary | ICD-10-CM | POA: Diagnosis not present

## 2022-11-30 LAB — CBC WITH DIFFERENTIAL/PLATELET
Abs Immature Granulocytes: 0.26 10*3/uL — ABNORMAL HIGH (ref 0.00–0.07)
Basophils Absolute: 0 10*3/uL (ref 0.0–0.1)
Basophils Relative: 0 %
Eosinophils Absolute: 0 10*3/uL (ref 0.0–0.5)
Eosinophils Relative: 0 %
HCT: 35.9 % — ABNORMAL LOW (ref 36.0–46.0)
Hemoglobin: 12.1 g/dL (ref 12.0–15.0)
Immature Granulocytes: 2 %
Lymphocytes Relative: 10 %
Lymphs Abs: 1.2 10*3/uL (ref 0.7–4.0)
MCH: 32.4 pg (ref 26.0–34.0)
MCHC: 33.7 g/dL (ref 30.0–36.0)
MCV: 96 fL (ref 80.0–100.0)
Monocytes Absolute: 0.7 10*3/uL (ref 0.1–1.0)
Monocytes Relative: 6 %
Neutro Abs: 9.9 10*3/uL — ABNORMAL HIGH (ref 1.7–7.7)
Neutrophils Relative %: 82 %
Platelets: 174 10*3/uL (ref 150–400)
RBC: 3.74 MIL/uL — ABNORMAL LOW (ref 3.87–5.11)
RDW: 13.2 % (ref 11.5–15.5)
WBC: 12.1 10*3/uL — ABNORMAL HIGH (ref 4.0–10.5)
nRBC: 0 % (ref 0.0–0.2)

## 2022-11-30 LAB — URINALYSIS, COMPLETE (UACMP) WITH MICROSCOPIC
Bilirubin Urine: NEGATIVE
Glucose, UA: NEGATIVE mg/dL
Hgb urine dipstick: NEGATIVE
Ketones, ur: NEGATIVE mg/dL
Leukocytes,Ua: NEGATIVE
Nitrite: NEGATIVE
Protein, ur: 30 mg/dL — AB
Specific Gravity, Urine: 1.005 (ref 1.005–1.030)
pH: 8 (ref 5.0–8.0)

## 2022-11-30 LAB — COMPREHENSIVE METABOLIC PANEL WITH GFR
ALT: 83 U/L — ABNORMAL HIGH (ref 0–44)
AST: 54 U/L — ABNORMAL HIGH (ref 15–41)
Albumin: 3.7 g/dL (ref 3.5–5.0)
Alkaline Phosphatase: 55 U/L (ref 38–126)
Anion gap: 10 (ref 5–15)
BUN: 17 mg/dL (ref 8–23)
CO2: 29 mmol/L (ref 22–32)
Calcium: 9.6 mg/dL (ref 8.9–10.3)
Chloride: 99 mmol/L (ref 98–111)
Creatinine, Ser: 0.87 mg/dL (ref 0.44–1.00)
GFR, Estimated: >60 mL/min
Glucose, Bld: 166 mg/dL — ABNORMAL HIGH (ref 70–99)
Potassium: 3.7 mmol/L (ref 3.5–5.1)
Sodium: 138 mmol/L (ref 135–145)
Total Bilirubin: 0.3 mg/dL (ref 0.3–1.2)
Total Protein: 6.2 g/dL — ABNORMAL LOW (ref 6.5–8.1)

## 2022-11-30 MED ORDER — LEUCOVORIN CALCIUM INJECTION 100 MG
15.0000 mg | Freq: Four times a day (QID) | INTRAMUSCULAR | Status: DC
  Filled 2022-11-30 (×2): qty 0.8

## 2022-11-30 MED ORDER — SODIUM CHLORIDE 0.9 % IV SOLN
8.0000 mg | Freq: Three times a day (TID) | INTRAVENOUS | Status: DC
  Filled 2022-11-30 (×2): qty 4

## 2022-11-30 MED ORDER — ONDANSETRON HCL 4 MG/2ML IJ SOLN: 4.0000 mg | Freq: Three times a day (TID) | INTRAMUSCULAR | Status: DC

## 2022-11-30 MED ORDER — ONDANSETRON 8 MG/NS 50 ML IVPB
8.0000 mg | Freq: Three times a day (TID) | INTRAVENOUS | Status: AC
  Administered 2022-11-30 – 2022-12-01 (×4): 8 mg via INTRAVENOUS
  Filled 2022-11-30 (×4): qty 8

## 2022-11-30 MED ORDER — LEUCOVORIN CALCIUM INJECTION 100 MG
15.0000 mg | Freq: Four times a day (QID) | INTRAMUSCULAR | Status: AC
  Administered 2022-11-30 – 2022-12-01 (×4): 16 mg via INTRAVENOUS
  Filled 2022-11-30 (×8): qty 0.8

## 2022-11-30 NOTE — Progress Notes (Signed)
So far, Ariel [name] is doing well.  She did have episode of vomiting this morning.  We have given her some antiemetics.  She feels a lot better this morning after getting sick.  We will see what her methotrexate level is.  I know that Pharmacy is doing a great job and help [place] manage this.  She is getting the leucovorin every 6 hours.  She has had no mouth sores.  She has had no cough or shortness of breath.  She has had no abdominal pain.  There is been no diarrhea.  She has had no bleeding.  She has had no leg swelling.  There is been no fever.  She has had no headache.  As expected, her LFTs were elevated.  Her SGPT was 83 SGOT 54.  Her blood sugar is also elevated at 166.  BUN 17 creatinine 0.87.  Calcium 9.6.  Her CBC is not back yet.  I do not have back or urine pH yet.  Her vital signs are temperature of 98.1.  Pulse 87.  Blood pressure 124/59.  Her head and neck exam shows no ocular or oral lesions.  There is no mucositis.  Her lungs are clear bilaterally.  Cardiac exam regular rate and rhythm.  Abdomen is soft.  Bowel sounds are present.  There is no fluid wave.  There is no palpable liver or spleen tip.  Extremity shows no clubbing, cyanosis or edema.  Neurological exam shows no focal neurological deficits.  Skin exam shows no rashes, ecchymosis or petechia.   [name] has received her second cycle of Rituxan/high-dose methotrexate.  We now have to await the resolution of her methotrexate levels.  Typically, it takes her about 3 to 4 days for the methotrexate levels to get down to where we can think about let her go.  Her labs look okay.  We need to see what her CBC shows.  I think we did give her a dose of Neupogen yesterday.  So far, she has done incredibly well.  The staff upon [address] are a major contributor to this.  They are doing a wonderful job.  She is very happy with the care that she is getting.  Again, we will monitor her methotrexate levels.  Pharmacy will help [place] out with  this.   Christin Bach, MD  [place] 14:14

## 2022-11-30 NOTE — Progress Notes (Signed)
Pt reported nausea, and vomited x1 approx 100cc. Declined antiemetic at this time, stating they all make her drowsy and she wants to be awake when MD rounds this AM. Mick Sell RN

## 2022-12-01 ENCOUNTER — Encounter (HOSPITAL_COMMUNITY): Payer: Self-pay | Admitting: Hematology & Oncology

## 2022-12-01 LAB — CBC WITH DIFFERENTIAL/PLATELET
Abs Immature Granulocytes: 0.09 10*3/uL — ABNORMAL HIGH (ref 0.00–0.07)
Basophils Absolute: 0 10*3/uL (ref 0.0–0.1)
Basophils Relative: 0 %
Eosinophils Absolute: 0 10*3/uL (ref 0.0–0.5)
Eosinophils Relative: 0 %
HCT: 33.7 % — ABNORMAL LOW (ref 36.0–46.0)
Hemoglobin: 11.1 g/dL — ABNORMAL LOW (ref 12.0–15.0)
Immature Granulocytes: 1 %
Lymphocytes Relative: 7 %
Lymphs Abs: 0.7 10*3/uL (ref 0.7–4.0)
MCH: 32 pg (ref 26.0–34.0)
MCHC: 32.9 g/dL (ref 30.0–36.0)
MCV: 97.1 fL (ref 80.0–100.0)
Monocytes Absolute: 0.2 10*3/uL (ref 0.1–1.0)
Monocytes Relative: 2 %
Neutro Abs: 8.6 10*3/uL — ABNORMAL HIGH (ref 1.7–7.7)
Neutrophils Relative %: 90 %
Platelets: 129 10*3/uL — ABNORMAL LOW (ref 150–400)
RBC: 3.47 MIL/uL — ABNORMAL LOW (ref 3.87–5.11)
RDW: 13.2 % (ref 11.5–15.5)
WBC: 9.5 10*3/uL (ref 4.0–10.5)
nRBC: 0 % (ref 0.0–0.2)

## 2022-12-01 LAB — URINALYSIS, COMPLETE (UACMP) WITH MICROSCOPIC
Bacteria, UA: NONE SEEN
Bilirubin Urine: NEGATIVE
Glucose, UA: NEGATIVE mg/dL
Hgb urine dipstick: NEGATIVE
Ketones, ur: NEGATIVE mg/dL
Leukocytes,Ua: NEGATIVE
Nitrite: NEGATIVE
Protein, ur: NEGATIVE mg/dL
Specific Gravity, Urine: 1.005 (ref 1.005–1.030)
pH: 9 — ABNORMAL HIGH (ref 5.0–8.0)

## 2022-12-01 LAB — COMPREHENSIVE METABOLIC PANEL
ALT: 192 U/L — ABNORMAL HIGH (ref 0–44)
AST: 124 U/L — ABNORMAL HIGH (ref 15–41)
Albumin: 3.7 g/dL (ref 3.5–5.0)
Alkaline Phosphatase: 55 U/L (ref 38–126)
Anion gap: 12 (ref 5–15)
BUN: 16 mg/dL (ref 8–23)
CO2: 28 mmol/L (ref 22–32)
Calcium: 8.9 mg/dL (ref 8.9–10.3)
Chloride: 97 mmol/L — ABNORMAL LOW (ref 98–111)
Creatinine, Ser: 1.31 mg/dL — ABNORMAL HIGH (ref 0.44–1.00)
GFR, Estimated: 44 mL/min — ABNORMAL LOW (ref >60)
Glucose, Bld: 131 mg/dL — ABNORMAL HIGH (ref 70–99)
Potassium: 3.2 mmol/L — ABNORMAL LOW (ref 3.5–5.1)
Sodium: 137 mmol/L (ref 135–145)
Total Bilirubin: 0.8 mg/dL (ref 0.3–1.2)
Total Protein: 6 g/dL — ABNORMAL LOW (ref 6.5–8.1)

## 2022-12-01 MED ORDER — LEUCOVORIN CALCIUM INJECTION 100 MG
15.0000 mg | Freq: Four times a day (QID) | INTRAMUSCULAR | Status: AC
  Administered 2022-12-01 – 2022-12-02 (×4): 16 mg via INTRAVENOUS
  Filled 2022-12-01 (×9): qty 0.8

## 2022-12-01 MED ORDER — POTASSIUM CHLORIDE 10 MEQ/50ML IV SOLN: 10.0000 meq | INTRAVENOUS | Status: DC

## 2022-12-01 MED ORDER — POTASSIUM CHLORIDE 10 MEQ/100ML IV SOLN
10.0000 meq | INTRAVENOUS | Status: AC
  Administered 2022-12-01 (×3): 10 meq via INTRAVENOUS
  Filled 2022-12-01 (×3): qty 100

## 2022-12-01 NOTE — Plan of Care (Signed)
  Problem: Education: Goal: Knowledge of risk factors and measures for prevention of condition will improve Outcome: Progressing   Problem: Activity: Goal: Ability to implement measures to reduce episodes of fatigue will improve Outcome: Progressing   Problem: Nutrition: Goal: Adequate nutrition will be maintained Outcome: Progressing

## 2022-12-01 NOTE — Plan of Care (Signed)
  Problem: Respiratory: Goal: Will maintain a patent airway Outcome: Progressing Goal: Complications related to the disease process, condition or treatment will be avoided or minimized Outcome: Progressing   Problem: Coping: Goal: Psychosocial and spiritual needs will be supported Outcome: Progressing

## 2022-12-02 ENCOUNTER — Other Ambulatory Visit: Payer: Self-pay

## 2022-12-02 DIAGNOSIS — C8580 Other specified types of non-Hodgkin lymphoma, unspecified site: Secondary | ICD-10-CM | POA: Diagnosis not present

## 2022-12-02 LAB — COMPREHENSIVE METABOLIC PANEL
ALT: 190 U/L — ABNORMAL HIGH (ref 0–44)
AST: 89 U/L — ABNORMAL HIGH (ref 15–41)
Albumin: 3.4 g/dL — ABNORMAL LOW (ref 3.5–5.0)
Alkaline Phosphatase: 58 U/L (ref 38–126)
Anion gap: 13 (ref 5–15)
BUN: 15 mg/dL (ref 8–23)
CO2: 28 mmol/L (ref 22–32)
Calcium: 8.7 mg/dL — ABNORMAL LOW (ref 8.9–10.3)
Chloride: 93 mmol/L — ABNORMAL LOW (ref 98–111)
Creatinine, Ser: 1.54 mg/dL — ABNORMAL HIGH (ref 0.44–1.00)
GFR, Estimated: 37 mL/min — ABNORMAL LOW (ref >60)
Glucose, Bld: 173 mg/dL — ABNORMAL HIGH (ref 70–99)
Potassium: 2.9 mmol/L — ABNORMAL LOW (ref 3.5–5.1)
Sodium: 134 mmol/L — ABNORMAL LOW (ref 135–145)
Total Bilirubin: 0.8 mg/dL (ref 0.3–1.2)
Total Protein: 5.8 g/dL — ABNORMAL LOW (ref 6.5–8.1)

## 2022-12-02 LAB — CBC WITH DIFFERENTIAL/PLATELET
Abs Immature Granulocytes: 0.06 10*3/uL (ref 0.00–0.07)
Basophils Absolute: 0 10*3/uL (ref 0.0–0.1)
Basophils Relative: 0 %
Eosinophils Absolute: 0 10*3/uL (ref 0.0–0.5)
Eosinophils Relative: 0 %
HCT: 30.1 % — ABNORMAL LOW (ref 36.0–46.0)
Hemoglobin: 10.2 g/dL — ABNORMAL LOW (ref 12.0–15.0)
Immature Granulocytes: 1 %
Lymphocytes Relative: 18 %
Lymphs Abs: 0.9 10*3/uL (ref 0.7–4.0)
MCH: 32.5 pg (ref 26.0–34.0)
MCHC: 33.9 g/dL (ref 30.0–36.0)
MCV: 95.9 fL (ref 80.0–100.0)
Monocytes Absolute: 0.3 10*3/uL (ref 0.1–1.0)
Monocytes Relative: 6 %
Neutro Abs: 3.5 10*3/uL (ref 1.7–7.7)
Neutrophils Relative %: 75 %
Platelets: 125 10*3/uL — ABNORMAL LOW (ref 150–400)
RBC: 3.14 MIL/uL — ABNORMAL LOW (ref 3.87–5.11)
RDW: 12.7 % (ref 11.5–15.5)
WBC: 4.8 10*3/uL (ref 4.0–10.5)
nRBC: 0 % (ref 0.0–0.2)

## 2022-12-02 LAB — URINALYSIS, COMPLETE (UACMP) WITH MICROSCOPIC
Bilirubin Urine: NEGATIVE
Glucose, UA: NEGATIVE mg/dL
Ketones, ur: NEGATIVE mg/dL
Leukocytes,Ua: NEGATIVE
Nitrite: NEGATIVE
Protein, ur: 30 mg/dL — AB
Specific Gravity, Urine: 1.004 — ABNORMAL LOW (ref 1.005–1.030)
pH: 8 (ref 5.0–8.0)

## 2022-12-02 MED ORDER — LEUCOVORIN CALCIUM INJECTION 100 MG
15.0000 mg | Freq: Four times a day (QID) | INTRAMUSCULAR | Status: AC
  Administered 2022-12-02 – 2022-12-03 (×4): 16 mg via INTRAVENOUS
  Filled 2022-12-02 (×9): qty 0.8

## 2022-12-02 MED ORDER — POTASSIUM CHLORIDE 10 MEQ/100ML IV SOLN
10.0000 meq | INTRAVENOUS | Status: AC
  Administered 2022-12-02 (×3): 10 meq via INTRAVENOUS
  Filled 2022-12-02 (×4): qty 100

## 2022-12-02 MED ORDER — FAMOTIDINE 20 MG PO TABS
40.0000 mg | ORAL_TABLET | Freq: Two times a day (BID) | ORAL | Status: DC
  Administered 2022-12-03: 40 mg via ORAL
  Filled 2022-12-02: qty 2

## 2022-12-02 NOTE — Progress Notes (Signed)
Patient noted to have a potassium of 2.9 results called to Dr. Myna Hidalgo new orders received.

## 2022-12-02 NOTE — Progress Notes (Signed)
So far, there are things going pretty well for Ariel [name]   She had a good night.  She really has had very little toxicity from the methotrexate.  Her last methotrexate level was 0.17.  Will see what it is today.  He is on leucovorin.  Again, pharmacy is doing a great job helping [place] out.  She is eating a little bit.  She still has little bit of nausea.  There is no diarrhea.  She has had no bleeding.  She has had no cough or shortness of breath.  She is out of bed a little bit.  So far, there are no labs back yet this morning.  Vital signs are temperature 97.9.  Pulse 105.  Blood pressure 112/48.  Her head neck exam shows no ocular or oral lesions.  She has no mucositis in the oral cavity.  Lungs are clear bilaterally.  Cardiac exam regular rate and rhythm.  Abdomen is soft.  Bowel sounds are present.  There is no fluid wave.  There is no palpable liver or spleen tip.  Extremity shows no clubbing, cyanosis or edema.  Neurological exam shows no focal neurological deficits.   We will have to see what the methotrexate level is.  Once the level is less than 0.05, then we can stop the methotrexate.  Hopefully, we will be able to get her home tomorrow if all goes well.  I do appreciate the incredible care that she is getting from everybody upon [address]   Christin Bach, MD  Duwayne Heck 41:10

## 2022-12-02 NOTE — Plan of Care (Signed)
  Problem: Education: Goal: Knowledge of risk factors and measures for prevention of condition will improve Outcome: Progressing   Problem: Coping: Goal: Psychosocial and spiritual needs will be supported Outcome: Progressing   Problem: Respiratory: Goal: Will maintain a patent airway Outcome: Progressing   Problem: Education: Goal: Knowledge of the prescribed therapeutic regimen will improve Outcome: Progressing   Problem: Activity: Goal: Ability to implement measures to reduce episodes of fatigue will improve Outcome: Progressing

## 2022-12-02 NOTE — Progress Notes (Signed)
Mobility Specialist - Progress Note   12/02/22 1047  Mobility  Activity Ambulated independently in hallway  Level of Assistance Independent  Assistive Device None  Distance Ambulated (ft) 500 ft  Activity Response Tolerated well  Mobility Referral Yes  $Mobility charge 1 Mobility  Mobility Specialist Start Time (ACUTE ONLY) 1038  Mobility Specialist Stop Time (ACUTE ONLY) 1046  Mobility Specialist Time Calculation (min) (ACUTE ONLY) 8 min   Pt received in bed and agreeable to mobility. No complaints during session. Pt to EOB after session with all needs met & nurse in room.     [facility]

## 2022-12-03 ENCOUNTER — Encounter (HOSPITAL_COMMUNITY): Payer: Self-pay | Admitting: Hematology & Oncology

## 2022-12-03 DIAGNOSIS — C8580 Other specified types of non-Hodgkin lymphoma, unspecified site: Secondary | ICD-10-CM | POA: Diagnosis not present

## 2022-12-03 LAB — COMPREHENSIVE METABOLIC PANEL
ALT: 162 U/L — ABNORMAL HIGH (ref 0–44)
AST: 60 U/L — ABNORMAL HIGH (ref 15–41)
Albumin: 3.8 g/dL (ref 3.5–5.0)
Alkaline Phosphatase: 63 U/L (ref 38–126)
Anion gap: 11 (ref 5–15)
BUN: 20 mg/dL (ref 8–23)
CO2: 32 mmol/L (ref 22–32)
Calcium: 9.3 mg/dL (ref 8.9–10.3)
Chloride: 97 mmol/L — ABNORMAL LOW (ref 98–111)
Creatinine, Ser: 1.42 mg/dL — ABNORMAL HIGH (ref 0.44–1.00)
GFR, Estimated: 40 mL/min — ABNORMAL LOW (ref >60)
Glucose, Bld: 142 mg/dL — ABNORMAL HIGH (ref 70–99)
Potassium: 3.2 mmol/L — ABNORMAL LOW (ref 3.5–5.1)
Sodium: 140 mmol/L (ref 135–145)
Total Bilirubin: 0.6 mg/dL (ref 0.3–1.2)
Total Protein: 6.4 g/dL — ABNORMAL LOW (ref 6.5–8.1)

## 2022-12-03 LAB — CBC WITH DIFFERENTIAL/PLATELET
Abs Immature Granulocytes: 0.01 10*3/uL (ref 0.00–0.07)
Basophils Absolute: 0 10*3/uL (ref 0.0–0.1)
Basophils Relative: 0 %
Eosinophils Absolute: 0 10*3/uL (ref 0.0–0.5)
Eosinophils Relative: 1 %
HCT: 34.1 % — ABNORMAL LOW (ref 36.0–46.0)
Hemoglobin: 11.4 g/dL — ABNORMAL LOW (ref 12.0–15.0)
Immature Granulocytes: 0 %
Lymphocytes Relative: 23 %
Lymphs Abs: 0.9 10*3/uL (ref 0.7–4.0)
MCH: 31.9 pg (ref 26.0–34.0)
MCHC: 33.4 g/dL (ref 30.0–36.0)
MCV: 95.5 fL (ref 80.0–100.0)
Monocytes Absolute: 0.4 10*3/uL (ref 0.1–1.0)
Monocytes Relative: 12 %
Neutro Abs: 2.4 10*3/uL (ref 1.7–7.7)
Neutrophils Relative %: 64 %
Platelets: 125 10*3/uL — ABNORMAL LOW (ref 150–400)
RBC: 3.57 MIL/uL — ABNORMAL LOW (ref 3.87–5.11)
RDW: 12.6 % (ref 11.5–15.5)
WBC: 3.7 10*3/uL — ABNORMAL LOW (ref 4.0–10.5)
nRBC: 0 % (ref 0.0–0.2)

## 2022-12-03 LAB — METHOTREXATE: Methotrexate: 0.1

## 2022-12-03 MED ORDER — CIPROFLOXACIN HCL 500 MG PO TABS
500.0000 mg | ORAL_TABLET | Freq: Every day | ORAL | Status: DC
  Administered 2022-12-03: 500 mg via ORAL
  Filled 2022-12-03: qty 1

## 2022-12-03 MED ORDER — BIOTENE DRY MOUTH MT LIQD: 15.0000 mL | Freq: Four times a day (QID) | OROMUCOSAL | 4 refills | Status: DC

## 2022-12-03 MED ORDER — HEPARIN SOD (PORK) LOCK FLUSH 100 UNIT/ML IV SOLN
500.0000 [IU] | INTRAVENOUS | Status: DC | PRN
  Filled 2022-12-03: qty 5

## 2022-12-03 MED ORDER — SODIUM BICARBONATE/SODIUM CHLORIDE MOUTHWASH: 15.0000 | OROMUCOSAL | 3 refills | Status: DC

## 2022-12-03 MED ORDER — CIPROFLOXACIN HCL 500 MG PO TABS: 500.0000 mg | ORAL_TABLET | Freq: Every day | ORAL | 3 refills | Status: DC

## 2022-12-03 MED ORDER — POTASSIUM CHLORIDE 10 MEQ/50ML IV SOLN
10.0000 meq | INTRAVENOUS | Status: AC
  Administered 2022-12-03 (×3): 10 meq via INTRAVENOUS
  Filled 2022-12-03 (×3): qty 50

## 2022-12-03 MED ORDER — FLUCONAZOLE 100 MG PO TABS
100.0000 mg | ORAL_TABLET | Freq: Every day | ORAL | Status: DC
  Administered 2022-12-03: 100 mg via ORAL
  Filled 2022-12-03: qty 1

## 2022-12-03 MED ORDER — FLUCONAZOLE 100 MG PO TABS
100.0000 mg | ORAL_TABLET | Freq: Every day | ORAL | 3 refills | Status: DC
Start: 2022-12-03 — End: 2023-01-18

## 2022-12-03 NOTE — Discharge Summary (Signed)
Physician Discharge Summary  Patient ID: Ariel [name] @ATTENDINGNPI @ MRN: [id number] DOB/AGE: 68/23/1957 68 y.o.  Admit date: 11/27/2022 Discharge date: 12/03/2022  Admission Diagnoses: Cycle #2 of high-dose methotrexate/Rituxan for relapse CNS non-Hodgkin's lymphoma  Discharge Diagnoses:  Principal Problem:   Lymphoma malignant, large cell (HCC)   Discharged Condition: good  Discharge Labs:   Significant Diagnostic Studies: None  Consults: None  Disposition:  There are no questions and answers to display.        Treatments: IV hydration  Allergies as of 12/03/2022       Reactions   Decadron [dexamethasone] Other (See Comments)   Leg weakness Hallucinations   Plavix [clopidogrel] Hives, Swelling, Other (See Comments)   Lip swelling   Prednisone Other (See Comments)   Steroids caused psychological issues  Hallucinations   Sulfa Antibiotics Itching, Rash   Codeine Nausea And Vomiting        Medication List     TAKE these medications    acetaminophen 325 MG tablet Commonly known as: TYLENOL Take 650 mg by mouth as needed for headache, fever, moderate pain or mild pain.   antiseptic oral rinse Liqd 15 mLs by Mouth Rinse route every 6 (six) hours.   ciprofloxacin 500 MG tablet Commonly known as: CIPRO Take 1 tablet (500 mg total) by mouth daily with breakfast.   famciclovir 500 MG tablet Commonly known as: FAMVIR Take 1 tablet (500 mg total) by mouth daily.   fluconazole 100 MG tablet Commonly known as: DIFLUCAN Take 1 tablet (100 mg total) by mouth daily.   ondansetron 8 MG disintegrating tablet Commonly known as: ZOFRAN-ODT Take 1 tablet (8 mg total) by mouth every 8 (eight) hours as needed for nausea or vomiting. What changed: when to take this   sodium bicarbonate/sodium chloride Soln 15 Applications by Mouth Rinse route every 4 (four) hours.         Follow-up Information     [name], MD Follow up in 1 day(s).   Specialty:  Oncology Why: Come to office on Monday for lab work and white blood cell injection. Contact information: [address] [place] [address] [phone]                 Hospital Course: [name] was admitted on 11/27/2022 secondary to high-dose methotrexate Rituxan.  She was hydrated.  She had IV bicarb given.  Thankfully, Pharmacy was so helpful in controlling this.  Her urine pH was greater than 8.  We started her on Rituxan.  She had received Rituxan on 11/27/2022.  She then received high-dose methotrexate on 11/28/2022.Marland Kitchen  She actually did incredibly well with the high-dose methotrexate.  She had some nausea and vomiting.  We did give her some extra antiemetics.  She is on scheduled Zofran.  Again, Pharmacy was wonderful and helping me out with the leucovorin.  We managed her leucovorin dosing.  She had her methotrexate levels.  On the day of discharge, her methotrexate level was less than 0.1.  As always, her LFTs increased with treatment.  These have been coming down.Marland Kitchen  She was ambulating.  She was not having diarrhea.  There is no mucositis.  She had no headaches.  She had no cough.  There was no bleeding.  Her labs when she was discharged show sodium 140.  Potassium 3.2.  I did give her some extra potassium.  Her BUN was 20 creatinine 1.42.  Calcium 9.3.  SGPT 167 SGOT 60.  Her white  cell count 3.7.  Hemoglobin 11.4.  Platelet count 125,000.  She was eating well.  She was having no problems with vision.  I felt that she was able to go home on 12/03/2022.  She really wanted to go home.  We will have to follow-up with her on 12/04/2022 for her Neulasta.  I will make sure that she goes home on Famvir, Diflucan, and ciprofloxacin for prophylactic coverage.     Discharge Exam: Blood pressure 130/70, pulse 95, temperature 98.1 F (36.7 C), temperature source Oral, resp. rate 17, weight 148 lb (67.1 kg), SpO2 99%. On her physical exam, her head and neck exam  shows no ocular or oral lesions.  She has no mucositis.  She has moist oral mucosa.  There is no adenopathy in the neck.  Lungs are clear bilaterally.  Cardiac exam regular rate and rhythm.  Abdomen is soft.  Bowel sounds are present.  There is no fluid wave.  There is no palpable liver or spleen tip.  Extremity shows no clubbing, cyanosis or edema.  She has good range of motion of her hips.  She has good pulses in distal extremities.  Neurological exam shows no focal neurological deficits.  Skin exam shows no rashes, ecchymosis or petechiae.    SignedJosph Macho 12/03/2022, 8:16 AM

## 2022-12-03 NOTE — TOC Transition Note (Signed)
Transition of Care [facility]) - CM/SW Discharge Note   Patient Details  Name: Lenoria Narine MRN: [id number] Date of Birth: Jul 18, 1955  [facility]) CM/SW Contact:  Darleene Cleaver, LCSW Phone Number: 12/03/2022, 10:20 AM   Clinical Narrative:     Patient is from [facility].  Patient will be returning, no needs.  TOC signing off, please reconsult if other TOC needs arise.   Final next level of care: Other (comment) ([facility].) Barriers to Discharge: Barriers Resolved   Patient Goals and CMS Choice CMS Medicare.gov Compare Post Acute Care list provided to:: Patient Represenative (must comment) Choice offered to / list presented to : Adult Children  Discharge Placement                Patient chooses bed at: [facility]) Patient to be transferred to facility by: Family   Patient and family notified of of transfer: 12/03/22  Discharge Plan and Services Additional resources added to the After Visit Summary for                                       Social Determinants of Health (SDOH) Interventions SDOH Screenings   Food Insecurity: No Food Insecurity (11/27/2022)  Housing: Low Risk  (11/27/2022)  Transportation Needs: No Transportation Needs (11/27/2022)  Utilities: Not At Risk (11/27/2022)  Alcohol Screen: Low Risk  (09/05/2022)  Depression (PHQ2-9): Low Risk  (09/06/2022)  Financial Resource Strain: Low Risk  (09/05/2022)  Physical Activity: Sufficiently Active (09/05/2022)  Social Connections: Moderately Isolated (09/05/2022)  Stress: No Stress Concern Present (09/05/2022)  Tobacco Use: Low Risk  (11/27/2022)     Readmission Risk Interventions    10/30/2022    1:29 PM 04/14/2022   10:59 AM 03/27/2022    3:00 PM  Readmission Risk Prevention Plan  Transportation Screening Complete Complete Complete  PCP or Specialist Appt within 5-7 Days Complete    Home Care Screening Complete    Medication Review (RN  CM) Complete    Medication Review Oceanographer)  Complete Complete  PCP or Specialist appointment within 3-5 days of discharge  Complete Complete  HRI or Home Care Consult  Complete Complete  SW Recovery Care/Counseling Consult   Complete  Palliative Care Screening   Complete  Skilled Nursing Facility   Complete

## 2022-12-03 NOTE — Plan of Care (Signed)
  Problem: Education: Goal: Knowledge of risk factors and measures for prevention of condition will improve Outcome: Progressing   Problem: Coping: Goal: Psychosocial and spiritual needs will be supported Outcome: Progressing   Problem: Respiratory: Goal: Will maintain a patent airway Outcome: Progressing Goal: Complications related to the disease process, condition or treatment will be avoided or minimized Outcome: Progressing   Problem: Education: Goal: Knowledge of the prescribed therapeutic regimen will improve Outcome: Progressing   Problem: Activity: Goal: Ability to implement measures to reduce episodes of fatigue will improve Outcome: Progressing   Problem: Bowel/Gastric: Goal: Will not experience complications related to bowel motility Outcome: Progressing   Problem: Coping: Goal: Ability to identify and develop effective coping behavior will improve Outcome: Progressing   Problem: Nutritional: Goal: Maintenance of adequate nutrition will improve Outcome: Progressing   Problem: Education: Goal: Knowledge of General Education information will improve Description: Including pain rating scale, medication(s)/side effects and non-pharmacologic comfort measures Outcome: Progressing   Problem: Health Behavior/Discharge Planning: Goal: Ability to manage health-related needs will improve Outcome: Progressing   Problem: Clinical Measurements: Goal: Ability to maintain clinical measurements within normal limits will improve Outcome: Progressing Goal: Will remain free from infection Outcome: Progressing Goal: Diagnostic test results will improve Outcome: Progressing Goal: Respiratory complications will improve Outcome: Progressing Goal: Cardiovascular complication will be avoided Outcome: Progressing   Problem: Activity: Goal: Risk for activity intolerance will decrease Outcome: Progressing   Problem: Nutrition: Goal: Adequate nutrition will be  maintained Outcome: Progressing   Problem: Coping: Goal: Level of anxiety will decrease Outcome: Progressing   Problem: Elimination: Goal: Will not experience complications related to bowel motility Outcome: Progressing Goal: Will not experience complications related to urinary retention Outcome: Progressing   Problem: Pain Managment: Goal: General experience of comfort will improve Outcome: Progressing   Problem: Safety: Goal: Ability to remain free from injury will improve Outcome: Progressing   Problem: Skin Integrity: Goal: Risk for impaired skin integrity will decrease Outcome: Progressing

## 2022-12-04 ENCOUNTER — Inpatient Hospital Stay: Payer: Medicare Other | Attending: Internal Medicine

## 2022-12-04 ENCOUNTER — Other Ambulatory Visit: Payer: Self-pay | Admitting: Hematology & Oncology

## 2022-12-04 ENCOUNTER — Other Ambulatory Visit: Payer: Self-pay

## 2022-12-04 VITALS — BP 139/66 | HR 81 | Temp 98.9°F | Resp 18

## 2022-12-04 DIAGNOSIS — Z5189 Encounter for other specified aftercare: Secondary | ICD-10-CM | POA: Insufficient documentation

## 2022-12-04 DIAGNOSIS — C8510 Unspecified B-cell lymphoma, unspecified site: Secondary | ICD-10-CM

## 2022-12-04 DIAGNOSIS — D500 Iron deficiency anemia secondary to blood loss (chronic): Secondary | ICD-10-CM

## 2022-12-04 DIAGNOSIS — C8332 Diffuse large B-cell lymphoma, intrathoracic lymph nodes: Secondary | ICD-10-CM

## 2022-12-04 DIAGNOSIS — C83398 Diffuse large b-cell lymphoma of other extranodal and solid organ sites: Secondary | ICD-10-CM | POA: Diagnosis not present

## 2022-12-04 MED ORDER — LEUCOVORIN CALCIUM INJECTION 100 MG: 15.0000 mg | Freq: Four times a day (QID) | INTRAMUSCULAR | Status: DC

## 2022-12-04 MED ORDER — PEGFILGRASTIM-CBQV 6 MG/0.6ML ~~LOC~~ SOSY
6.0000 mg | PREFILLED_SYRINGE | Freq: Once | SUBCUTANEOUS | Status: AC
  Administered 2022-12-04: 6 mg via SUBCUTANEOUS
  Filled 2022-12-04: qty 0.6

## 2022-12-05 LAB — METHOTREXATE
Methotrexate: 0.32
Methotrexate: 0.17

## 2022-12-06 ENCOUNTER — Other Ambulatory Visit: Payer: Self-pay

## 2022-12-06 DIAGNOSIS — R2689 Other abnormalities of gait and mobility: Secondary | ICD-10-CM | POA: Diagnosis not present

## 2022-12-06 DIAGNOSIS — M62551 Muscle wasting and atrophy, not elsewhere classified, right thigh: Secondary | ICD-10-CM | POA: Diagnosis not present

## 2022-12-06 DIAGNOSIS — M62561 Muscle wasting and atrophy, not elsewhere classified, right lower leg: Secondary | ICD-10-CM | POA: Diagnosis not present

## 2022-12-06 DIAGNOSIS — M62562 Muscle wasting and atrophy, not elsewhere classified, left lower leg: Secondary | ICD-10-CM | POA: Diagnosis not present

## 2022-12-06 DIAGNOSIS — M62552 Muscle wasting and atrophy, not elsewhere classified, left thigh: Secondary | ICD-10-CM | POA: Diagnosis not present

## 2022-12-07 ENCOUNTER — Telehealth: Payer: Self-pay | Admitting: *Deleted

## 2022-12-07 DIAGNOSIS — M62552 Muscle wasting and atrophy, not elsewhere classified, left thigh: Secondary | ICD-10-CM | POA: Diagnosis not present

## 2022-12-07 DIAGNOSIS — M62561 Muscle wasting and atrophy, not elsewhere classified, right lower leg: Secondary | ICD-10-CM | POA: Diagnosis not present

## 2022-12-07 DIAGNOSIS — M62551 Muscle wasting and atrophy, not elsewhere classified, right thigh: Secondary | ICD-10-CM | POA: Diagnosis not present

## 2022-12-07 DIAGNOSIS — R2689 Other abnormalities of gait and mobility: Secondary | ICD-10-CM | POA: Diagnosis not present

## 2022-12-07 DIAGNOSIS — M62562 Muscle wasting and atrophy, not elsewhere classified, left lower leg: Secondary | ICD-10-CM | POA: Diagnosis not present

## 2022-12-07 NOTE — Transitions of Care (Post Inpatient/ED Visit) (Signed)
12/07/2022  Name: Kiyanna Delmedico MRN: [id number] DOB: [birth year]  Today's TOC FU Call Status: Today's TOC FU Call Status:: Unsuccessful Call (1st Attempt) Unsuccessful Call (1st Attempt) Date: 12/07/22  Attempted to reach the patient regarding the most recent Inpatient visit; left HIPAA compliant voice message requesting call back  Follow Up Plan: Additional outreach attempts will be made to reach the patient to complete the Transitions of Care (Post Inpatient visit) call.   Caryl Pina, RN, BSN, Media planner  Transitions of Care  [facility] - [facility] [phone]: direct office

## 2022-12-08 ENCOUNTER — Telehealth: Payer: Self-pay | Admitting: *Deleted

## 2022-12-08 NOTE — Transitions of Care (Post Inpatient/ED Visit) (Signed)
12/08/2022  Name: Ariel [name] MRN: [id number] DOB: 1956-02-07  Today's TOC FU Call Status: Today's TOC FU Call Status:: Successful TOC FU Call Completed TOC FU Call Complete Date: 12/08/22 Patient's Name and Date of Birth confirmed.  Transition Care Management Follow-up Telephone Call Date of Discharge: 12/03/22 Discharge Facility: [facility]) Type of Discharge: Inpatient Admission Primary Inpatient Discharge Diagnosis:: Non-Hodgkins lymphoma; cycle -2 methotrextrate/ rituxan How have you been since you were released from the hospital?: Better ("I am doing fine; back to my normal activities- not having any problems; I am completely independent in taking care of myself")  Items Reviewed: Did you receive and understand the discharge instructions provided?: Yes (thoroughly reviewed with patient who verbalizes good understanding of same) Medications obtained,verified, and reconciled?: Yes (Medications Reviewed) (Full medication reconciliation/ review completed; no concerns or discrepancies identified; confirmed patient obtained/ is taking all newly Rx'd medications as instructed; self-manages medications and denies questions/ concerns around medications today) Any new allergies since your discharge?: No Dietary orders reviewed?: Yes Type of Diet Ordered:: Regular Do you have support at home?: Yes People in Home: alone Name of Support/Comfort Primary Source: Reports independent in self-care activities; resides in ILF- [employer]; staff supports as indicated  Medications Reviewed Today: Medications Reviewed Today     Reviewed by Michaela Corner, RN (Registered Nurse) on 12/08/22 at 1036  Med List Status: <None>   Medication Order Taking? Sig Documenting Provider Last Dose Status Informant  acetaminophen (TYLENOL) 325 MG tablet [id number] Yes Take 650 mg by mouth as needed for headache, fever, moderate pain or mild pain. [provider] Taking Active Self, Pharmacy  Records  antiseptic oral rinse (BIOTENE) LIQD [id number] No 15 mLs by Mouth Rinse route every 6 (six) hours.  Patient not taking: Reported on 12/08/2022   [name], MD Not Taking Active   ciprofloxacin (CIPRO) 500 MG tablet [id number] Yes Take 1 tablet (500 mg total) by mouth daily with breakfast. [name], MD Taking Active   famciclovir [facility]) 500 MG tablet [id number] Yes Take 1 tablet (500 mg total) by mouth daily. Carollee Herter, DO Taking Active Self, Pharmacy Records  fluconazole (DIFLUCAN) 100 MG tablet [id number] Yes Take 1 tablet (100 mg total) by mouth daily. [name], MD Taking Active   ondansetron (ZOFRAN-ODT) 8 MG disintegrating tablet [id number] Yes Take 1 tablet (8 mg total) by mouth every 8 (eight) hours as needed for nausea or vomiting.  Patient taking differently: Take 8 mg by mouth as needed for nausea or vomiting.   [name], MD Taking Active Self, Pharmacy Records           Med Note Michaela Corner   Fri Dec 08, 2022 10:36 AM) 12/08/22: Reports during TOC call, has not needed recently  Sodium Chloride-Sodium Bicarb (SODIUM BICARBONATE/SODIUM CHLORIDE) SOLN [id number] Yes 15 Applications by Mouth Rinse route every 4 (four) hours. [name], MD Taking Active            Home Care and Equipment/Supplies: Were Home Health Services Ordered?: No Any new equipment or medical supplies ordered?: No  Functional Questionnaire: Do you need assistance with bathing/showering or dressing?: No Do you need assistance with meal preparation?: No Do you need assistance with eating?: No Do you have difficulty maintaining continence: No Do you need assistance with getting out of bed/getting out of a chair/moving?: No Do you have difficulty managing or taking your medications?: No  Follow up appointments reviewed: PCP Follow-up appointment confirmed?: NA (  verified not indicated per hospital discharging provider discharge notes) Specialist Hospital  Follow-up appointment confirmed?: Yes Date of Specialist follow-up appointment?: 12/04/22 Follow-Up Specialty Provider:: oncology provider Do you need transportation to your follow-up appointment?: No Do you understand care options if your condition(s) worsen?: Yes-patient verbalized understanding  SDOH Interventions Today    Flowsheet Row Most Recent Value  SDOH Interventions   Food Insecurity Interventions Intervention Not Indicated  Transportation Interventions Intervention Not Indicated  [drives self,  uses ILF transportation services for transportation to provider appointments]      TOC Interventions Today    Flowsheet Row Most Recent Value  TOC Interventions   TOC Interventions Discussed/Reviewed TOC Interventions Discussed  [Patient declines need for ongoing/ further care management/ coordination outreach,  no needs identified at time of TOC call today- declines enrollment in 30-day TOC program]      Interventions Today    Flowsheet Row Most Recent Value  Chronic Disease   Chronic disease during today's visit Other  [Lymphoma]  General Interventions   General Interventions Discussed/Reviewed General Interventions Discussed, Durable Medical Equipment (DME), Doctor Visits  Doctor Visits Discussed/Reviewed Doctor Visits Discussed, Specialist  Durable Medical Equipment (DME) Other  [confirmed not currently requiring/ using assistive devices for ambulation]  PCP/Specialist Visits Compliance with follow-up visit  Nutrition Interventions   Nutrition Discussed/Reviewed Nutrition Discussed  Pharmacy Interventions   Pharmacy Dicussed/Reviewed Pharmacy Topics Discussed  [Full medication review with updating medication list in EHR per patient report]  Safety Interventions   Safety Discussed/Reviewed Safety Discussed      Caryl Pina, RN, BSN, CCRN Alumnus RN Care Manager  Transitions of Care  [facility] - Population Health  [place] [phone]: direct office

## 2022-12-12 ENCOUNTER — Other Ambulatory Visit: Payer: Self-pay

## 2022-12-12 DIAGNOSIS — M62552 Muscle wasting and atrophy, not elsewhere classified, left thigh: Secondary | ICD-10-CM | POA: Diagnosis not present

## 2022-12-12 DIAGNOSIS — R2689 Other abnormalities of gait and mobility: Secondary | ICD-10-CM | POA: Diagnosis not present

## 2022-12-12 DIAGNOSIS — M62551 Muscle wasting and atrophy, not elsewhere classified, right thigh: Secondary | ICD-10-CM | POA: Diagnosis not present

## 2022-12-12 DIAGNOSIS — M62561 Muscle wasting and atrophy, not elsewhere classified, right lower leg: Secondary | ICD-10-CM | POA: Diagnosis not present

## 2022-12-12 DIAGNOSIS — M62562 Muscle wasting and atrophy, not elsewhere classified, left lower leg: Secondary | ICD-10-CM | POA: Diagnosis not present

## 2022-12-12 MED ORDER — FAMCICLOVIR 500 MG PO TABS: 500.0000 mg | ORAL_TABLET | Freq: Every day | ORAL | 1 refills | Status: DC

## 2022-12-14 DIAGNOSIS — M62562 Muscle wasting and atrophy, not elsewhere classified, left lower leg: Secondary | ICD-10-CM | POA: Diagnosis not present

## 2022-12-14 DIAGNOSIS — M62551 Muscle wasting and atrophy, not elsewhere classified, right thigh: Secondary | ICD-10-CM | POA: Diagnosis not present

## 2022-12-14 DIAGNOSIS — M62561 Muscle wasting and atrophy, not elsewhere classified, right lower leg: Secondary | ICD-10-CM | POA: Diagnosis not present

## 2022-12-14 DIAGNOSIS — R2689 Other abnormalities of gait and mobility: Secondary | ICD-10-CM | POA: Diagnosis not present

## 2022-12-14 DIAGNOSIS — M62552 Muscle wasting and atrophy, not elsewhere classified, left thigh: Secondary | ICD-10-CM | POA: Diagnosis not present

## 2022-12-15 ENCOUNTER — Other Ambulatory Visit: Payer: Self-pay

## 2022-12-18 ENCOUNTER — Ambulatory Visit (HOSPITAL_COMMUNITY)
Admission: RE | Admit: 2022-12-18 | Discharge: 2022-12-18 | Disposition: A | Discharge status: Home / Self Care | Payer: Medicare Other | Source: Ambulatory Visit | Attending: Hematology & Oncology | Admitting: Hematology & Oncology

## 2022-12-18 DIAGNOSIS — C8510 Unspecified B-cell lymphoma, unspecified site: Secondary | ICD-10-CM | POA: Diagnosis not present

## 2022-12-18 DIAGNOSIS — G9389 Other specified disorders of brain: Secondary | ICD-10-CM | POA: Diagnosis not present

## 2022-12-18 DIAGNOSIS — Z9221 Personal history of antineoplastic chemotherapy: Secondary | ICD-10-CM | POA: Diagnosis not present

## 2022-12-18 DIAGNOSIS — C8589 Other specified types of non-Hodgkin lymphoma, extranodal and solid organ sites: Secondary | ICD-10-CM | POA: Diagnosis not present

## 2022-12-18 MED ORDER — GADOBUTROL 1 MMOL/ML IV SOLN
5.0000 mL | Freq: Once | INTRAVENOUS | Status: AC | PRN
  Administered 2022-12-18: 5 mL via INTRAVENOUS

## 2022-12-19 DIAGNOSIS — M62552 Muscle wasting and atrophy, not elsewhere classified, left thigh: Secondary | ICD-10-CM | POA: Diagnosis not present

## 2022-12-19 DIAGNOSIS — R2689 Other abnormalities of gait and mobility: Secondary | ICD-10-CM | POA: Diagnosis not present

## 2022-12-19 DIAGNOSIS — M62551 Muscle wasting and atrophy, not elsewhere classified, right thigh: Secondary | ICD-10-CM | POA: Diagnosis not present

## 2022-12-19 DIAGNOSIS — M62561 Muscle wasting and atrophy, not elsewhere classified, right lower leg: Secondary | ICD-10-CM | POA: Diagnosis not present

## 2022-12-19 DIAGNOSIS — M62562 Muscle wasting and atrophy, not elsewhere classified, left lower leg: Secondary | ICD-10-CM | POA: Diagnosis not present

## 2022-12-21 DIAGNOSIS — M62552 Muscle wasting and atrophy, not elsewhere classified, left thigh: Secondary | ICD-10-CM | POA: Diagnosis not present

## 2022-12-21 DIAGNOSIS — M62562 Muscle wasting and atrophy, not elsewhere classified, left lower leg: Secondary | ICD-10-CM | POA: Diagnosis not present

## 2022-12-21 DIAGNOSIS — M62551 Muscle wasting and atrophy, not elsewhere classified, right thigh: Secondary | ICD-10-CM | POA: Diagnosis not present

## 2022-12-21 DIAGNOSIS — M62561 Muscle wasting and atrophy, not elsewhere classified, right lower leg: Secondary | ICD-10-CM | POA: Diagnosis not present

## 2022-12-21 DIAGNOSIS — R2689 Other abnormalities of gait and mobility: Secondary | ICD-10-CM | POA: Diagnosis not present

## 2022-12-25 ENCOUNTER — Telehealth: Payer: Self-pay

## 2022-12-25 DIAGNOSIS — M62562 Muscle wasting and atrophy, not elsewhere classified, left lower leg: Secondary | ICD-10-CM | POA: Diagnosis not present

## 2022-12-25 DIAGNOSIS — M62561 Muscle wasting and atrophy, not elsewhere classified, right lower leg: Secondary | ICD-10-CM | POA: Diagnosis not present

## 2022-12-25 DIAGNOSIS — C8510 Unspecified B-cell lymphoma, unspecified site: Secondary | ICD-10-CM

## 2022-12-25 DIAGNOSIS — M62552 Muscle wasting and atrophy, not elsewhere classified, left thigh: Secondary | ICD-10-CM | POA: Diagnosis not present

## 2022-12-25 DIAGNOSIS — R2689 Other abnormalities of gait and mobility: Secondary | ICD-10-CM | POA: Diagnosis not present

## 2022-12-25 DIAGNOSIS — M62551 Muscle wasting and atrophy, not elsewhere classified, right thigh: Secondary | ICD-10-CM | POA: Diagnosis not present

## 2022-12-25 MED ORDER — GABAPENTIN 300 MG PO CAPS: 300.0000 mg | ORAL_CAPSULE | Freq: Three times a day (TID) | ORAL | 0 refills | Status: DC

## 2022-12-25 NOTE — Telephone Encounter (Signed)
Pt calling stating that she was having increased neuropathy to her feet and knees. Pt described the symptoms as tingling and burning in her feet and knees making it hard to walk.  Dr. Myna Hidalgo aware and prescription for gabapentin sent to pt pharmacy. Pt aware that the medication can make her drowsy and not to drive while taking medication. Pt verbalized understanding and had no further questions.

## 2022-12-29 ENCOUNTER — Encounter (HOSPITAL_COMMUNITY): Payer: Self-pay | Admitting: Hematology & Oncology

## 2022-12-29 ENCOUNTER — Other Ambulatory Visit: Payer: Self-pay

## 2022-12-29 ENCOUNTER — Encounter (HOSPITAL_COMMUNITY): Payer: Self-pay

## 2022-12-29 ENCOUNTER — Encounter: Payer: Self-pay | Admitting: *Deleted

## 2023-01-01 ENCOUNTER — Encounter (HOSPITAL_COMMUNITY): Payer: Self-pay | Admitting: Hematology & Oncology

## 2023-01-02 ENCOUNTER — Encounter (HOSPITAL_COMMUNITY): Payer: Self-pay | Admitting: Hematology & Oncology

## 2023-01-03 ENCOUNTER — Inpatient Hospital Stay (HOSPITAL_COMMUNITY): Admission: AD | Admit: 2023-01-03 | Payer: Medicare Other | Source: Ambulatory Visit | Admitting: Hematology & Oncology

## 2023-01-03 ENCOUNTER — Encounter (HOSPITAL_COMMUNITY): Payer: Self-pay | Admitting: Hematology & Oncology

## 2023-01-05 ENCOUNTER — Encounter: Payer: Self-pay | Admitting: *Deleted

## 2023-01-05 ENCOUNTER — Inpatient Hospital Stay: Payer: Medicare Other | Attending: Internal Medicine

## 2023-01-05 ENCOUNTER — Encounter: Payer: Self-pay | Admitting: Hematology & Oncology

## 2023-01-05 ENCOUNTER — Other Ambulatory Visit: Payer: Self-pay

## 2023-01-05 ENCOUNTER — Inpatient Hospital Stay (HOSPITAL_BASED_OUTPATIENT_CLINIC_OR_DEPARTMENT_OTHER): Payer: Medicare Other | Admitting: Hematology & Oncology

## 2023-01-05 ENCOUNTER — Telehealth: Payer: Self-pay | Admitting: *Deleted

## 2023-01-05 ENCOUNTER — Encounter (HOSPITAL_COMMUNITY): Payer: Self-pay

## 2023-01-05 ENCOUNTER — Inpatient Hospital Stay: Payer: Medicare Other

## 2023-01-05 VITALS — BP 123/64 | HR 73 | Temp 97.6°F | Resp 18 | Ht 67.0 in | Wt 158.0 lb

## 2023-01-05 DIAGNOSIS — C8510 Unspecified B-cell lymphoma, unspecified site: Secondary | ICD-10-CM

## 2023-01-05 DIAGNOSIS — D509 Iron deficiency anemia, unspecified: Secondary | ICD-10-CM | POA: Insufficient documentation

## 2023-01-05 DIAGNOSIS — Z882 Allergy status to sulfonamides status: Secondary | ICD-10-CM | POA: Diagnosis not present

## 2023-01-05 DIAGNOSIS — C8330 Diffuse large B-cell lymphoma, unspecified site: Secondary | ICD-10-CM

## 2023-01-05 DIAGNOSIS — C8332 Diffuse large B-cell lymphoma, intrathoracic lymph nodes: Secondary | ICD-10-CM

## 2023-01-05 DIAGNOSIS — Z9221 Personal history of antineoplastic chemotherapy: Secondary | ICD-10-CM | POA: Insufficient documentation

## 2023-01-05 DIAGNOSIS — Z888 Allergy status to other drugs, medicaments and biological substances status: Secondary | ICD-10-CM | POA: Diagnosis not present

## 2023-01-05 DIAGNOSIS — Z885 Allergy status to narcotic agent status: Secondary | ICD-10-CM | POA: Diagnosis not present

## 2023-01-05 DIAGNOSIS — C8331 Diffuse large B-cell lymphoma, lymph nodes of head, face, and neck: Secondary | ICD-10-CM | POA: Diagnosis not present

## 2023-01-05 DIAGNOSIS — Z5111 Encounter for antineoplastic chemotherapy: Secondary | ICD-10-CM | POA: Diagnosis not present

## 2023-01-05 DIAGNOSIS — C83398 Diffuse large b-cell lymphoma of other extranodal and solid organ sites: Secondary | ICD-10-CM | POA: Insufficient documentation

## 2023-01-05 LAB — CMP (CANCER CENTER ONLY)
ALT: 15 U/L (ref 0–44)
AST: 16 U/L (ref 15–41)
Albumin: 4.6 g/dL (ref 3.5–5.0)
Alkaline Phosphatase: 51 U/L (ref 38–126)
Anion gap: 7 (ref 5–15)
BUN: 17 mg/dL (ref 8–23)
CO2: 29 mmol/L (ref 22–32)
Calcium: 10.1 mg/dL (ref 8.9–10.3)
Chloride: 102 mmol/L (ref 98–111)
Creatinine: 0.73 mg/dL (ref 0.44–1.00)
GFR, Estimated: >60 mL/min (ref >60)
Glucose, Bld: 144 mg/dL — ABNORMAL HIGH (ref 70–99)
Potassium: 3.9 mmol/L (ref 3.5–5.1)
Sodium: 138 mmol/L (ref 135–145)
Total Bilirubin: 0.4 mg/dL (ref <1.2)
Total Protein: 6.4 g/dL — ABNORMAL LOW (ref 6.5–8.1)

## 2023-01-05 LAB — CBC WITH DIFFERENTIAL (CANCER CENTER ONLY)
Abs Immature Granulocytes: 0.01 10*3/uL (ref 0.00–0.07)
Basophils Absolute: 0 10*3/uL (ref 0.0–0.1)
Basophils Relative: 0 %
Eosinophils Absolute: 0.1 10*3/uL (ref 0.0–0.5)
Eosinophils Relative: 3 %
HCT: 33.4 % — ABNORMAL LOW (ref 36.0–46.0)
Hemoglobin: 11.4 g/dL — ABNORMAL LOW (ref 12.0–15.0)
Immature Granulocytes: 0 %
Lymphocytes Relative: 34 %
Lymphs Abs: 1.5 10*3/uL (ref 0.7–4.0)
MCH: 32.9 pg (ref 26.0–34.0)
MCHC: 34.1 g/dL (ref 30.0–36.0)
MCV: 96.3 fL (ref 80.0–100.0)
Monocytes Absolute: 0.6 10*3/uL (ref 0.1–1.0)
Monocytes Relative: 13 %
Neutro Abs: 2.2 10*3/uL (ref 1.7–7.7)
Neutrophils Relative %: 50 %
Platelet Count: 213 10*3/uL (ref 150–400)
RBC: 3.47 MIL/uL — ABNORMAL LOW (ref 3.87–5.11)
RDW: 14.2 % (ref 11.5–15.5)
WBC Count: 4.5 10*3/uL (ref 4.0–10.5)
nRBC: 0 % (ref 0.0–0.2)

## 2023-01-05 LAB — LACTATE DEHYDROGENASE: LDH: 153 U/L (ref 98–192)

## 2023-01-05 MED ORDER — SODIUM CHLORIDE 0.9% FLUSH
10.0000 mL | INTRAVENOUS | Status: DC | PRN
Start: 2023-01-05 — End: 2023-01-05
  Administered 2023-01-05: 10 mL

## 2023-01-05 MED ORDER — HEPARIN SOD (PORK) LOCK FLUSH 100 UNIT/ML IV SOLN
500.0000 [IU] | Freq: Once | INTRAVENOUS | Status: AC | PRN
Start: 2023-01-05 — End: 2023-01-05
  Administered 2023-01-05: 500 [IU]

## 2023-01-05 NOTE — Progress Notes (Signed)
Hematology and Oncology Follow Up Visit  Ariel [name] [id number] [birth year] 67 y.o. 01/05/2023   Principle Diagnosis:  Diffuse large cell non-Hodgkin's lymphoma-CNS involvement -- relapsed   Past Therapy: Status post chemotherapy with R-ICE/R-MTV --  s/p cycle #4 MATRIX -- s/p cycle #4 -- start on 05/02/2021 --completed on 08/06/2021 Neulasta 6 mg subcu post chemotherapy MTV- s/p cycle #1 -- given on 01/22/204   Current Therapy:        Imbruvica/Rituxan -- started 05/02/2022, s/p cycle #5 -DC on 07/16/2022 --DC on 10/29/2022 High-dose methotrexate/Rituxan-cycle #2 given on 11/03/2022   Interim History:  Ariel [name] is here today for follow-up.  She is doing quite nicely.  More poorly is the fact that on her MRI of the brain, which she had on 12/18/2022, she had a very nice improvement in the recurrent lymphoma..  She feels great.  She really has had no specific complaints.  [facility].  She enjoys it there.  She is exercising.  She is eating well.  She is having no nausea or vomiting.  She is having no problems with cough or shortness of breath.  She is having no change in bowel or bladder habits.  There is been no rashes.  There is no weakness.  She was wondering  if she could have knee injections.  I told her that I saw no problems with her having knee injections.  Overall, I would have to say that her performance status is probably ECOG 1.  Medications:  Allergies as of 01/05/2023       Reactions   Decadron [dexamethasone] Other (See Comments)   Leg weakness Hallucinations   Plavix [clopidogrel] Hives, Swelling, Other (See Comments)   Lip swelling   Prednisone Other (See Comments)   Steroids caused psychological issues  Hallucinations   Sulfa Antibiotics Itching, Rash   Codeine Nausea And Vomiting        Medication List        Accurate as of January 05, 2023  1:17 PM. If you have any questions, ask your nurse or doctor.          STOP taking  these medications    famciclovir 500 MG tablet Commonly known as: FAMVIR Stopped by: [name]       TAKE these medications    acetaminophen 325 MG tablet Commonly known as: TYLENOL Take 650 mg by mouth as needed for headache, fever, moderate pain or mild pain.   antiseptic oral rinse Liqd 15 mLs by Mouth Rinse route every 6 (six) hours.   ciprofloxacin 500 MG tablet Commonly known as: CIPRO Take 500 mg by mouth daily with breakfast. What changed: Another medication with the same name was removed. Continue taking this medication, and follow the directions you see here. Changed by: [name]   fluconazole 100 MG tablet Commonly known as: DIFLUCAN Take 1 tablet (100 mg total) by mouth daily.   gabapentin 300 MG capsule Commonly known as: NEURONTIN Take 1 capsule (300 mg total) by mouth 3 (three) times daily. Take 1 capsule (300 mg total) by mouth 3 (three) times daily for neuropathy pain.   ondansetron 8 MG disintegrating tablet Commonly known as: ZOFRAN-ODT Take 1 tablet (8 mg total) by mouth every 8 (eight) hours as needed for nausea or vomiting.   sodium bicarbonate/sodium chloride Soln 15 Applications by Mouth Rinse route every 4 (four) hours.        Allergies:  Allergies  Allergen Reactions  Decadron [Dexamethasone] Other (See Comments)    Leg weakness Hallucinations   Plavix [Clopidogrel] Hives, Swelling and Other (See Comments)    Lip swelling    Prednisone Other (See Comments)    Steroids caused psychological issues  Hallucinations   Sulfa Antibiotics Itching and Rash   Codeine Nausea And Vomiting    Past Medical History, Surgical history, Social history, and Family History were reviewed and updated.  Review of Systems: Review of Systems  Constitutional: Negative.   HENT: Negative.    Eyes: Negative.   Respiratory: Negative.    Cardiovascular: Negative.   Gastrointestinal: Negative.   Genitourinary: Negative.   Musculoskeletal:  Negative.   Skin: Negative.   Neurological: Negative.   Endo/Heme/Allergies: Negative.   Psychiatric/Behavioral: Negative.       Physical Exam:  Vital signs are temperature 97.6.  Pulse 73.  Blood pressure 123/64.  Weight is 158 pounds.   Wt Readings from Last 3 Encounters:  01/05/23 158 lb (71.7 kg)  11/30/22 148 lb (67.1 kg)  11/15/22 148 lb (67.1 kg)    Physical Activity: Sufficiently Active (09/05/2022)   Exercise Vital Sign    Days of Exercise per Week: 6 days    Minutes of Exercise per Session: 40 min   Physical Exam Vitals reviewed.  HENT:     Head: Normocephalic and atraumatic.  Eyes:     Pupils: Pupils are equal, round, and reactive to light.  Cardiovascular:     Rate and Rhythm: Normal rate and regular rhythm.     Heart sounds: Normal heart sounds.  Pulmonary:     Effort: Pulmonary effort is normal.     Breath sounds: Normal breath sounds.  Abdominal:     General: Bowel sounds are normal.     Palpations: Abdomen is soft.  Musculoskeletal:        General: No tenderness or deformity. Normal range of motion.     Cervical back: Normal range of motion.  Lymphadenopathy:     Cervical: No cervical adenopathy.  Skin:    General: Skin is warm and dry.     Findings: No erythema or rash.  Neurological:     Mental Status: She is alert and oriented to person, place, and time.  Psychiatric:        Behavior: Behavior normal.        Thought Content: Thought content normal.        Judgment: Judgment normal.      Lab Results  Component Value Date   WBC 4.5 01/05/2023   HGB 11.4 (L) 01/05/2023   HCT 33.4 (L) 01/05/2023   MCV 96.3 01/05/2023   PLT 213 01/05/2023   Lab Results  Component Value Date   FERRITIN 944 (H) 10/16/2022   IRON 61 10/16/2022   TIBC 277 10/16/2022   UIBC 216 10/16/2022   IRONPCTSAT 22 10/16/2022   Lab Results  Component Value Date   RETICCTPCT 2.4 11/15/2021   RBC 3.47 (L) 01/05/2023   No results found for: "KPAFRELGTCHN",  "LAMBDASER", "KAPLAMBRATIO" No results found for: "IGGSERUM", "IGA", "IGMSERUM" No results found for: "TOTALPROTELP", "ALBUMINELP", "A1GS", "A2GS", "BETS", "BETA2SER", "GAMS", "MSPIKE", "SPEI"   Chemistry      Component Value Date/Time   NA 138 01/05/2023 1215   K 3.9 01/05/2023 1215   CL 102 01/05/2023 1215   CO2 29 01/05/2023 1215   BUN 17 01/05/2023 1215   CREATININE 0.73 01/05/2023 1215   CREATININE 0.68 11/18/2012 1439      Component Value Date/Time  CALCIUM 10.1 01/05/2023 1215   ALKPHOS 51 01/05/2023 1215   AST 16 01/05/2023 1215   ALT 15 01/05/2023 1215   BILITOT 0.4 01/05/2023 1215       Impression and Plan: Ariel [name] is a very pleasant 67 yo caucasian female with iron deficiency anemia and then was diagnosed with diffuse large cell non-Hodgkin's lymphoma that had traveled to her brain.  She now has had another relapse.  She had been on Rituxan/IMBRUVICA.  We went and stopped this.  We now have her on Rituxan/high-dose of methotrexate.  She is responding by the MRI.  I did talk to her at length about CAR-T therapy.  She still does not want to consider this.  I am not sure as to what the reservation is with this.  I just hate the fact that she might miss an opportunity to get into a longstanding remission.  We will have to get her admitted for her third cycle of high-dose methotrexate/Rituxan.  We will see about getting her admitted next week.  I would probably do 2 more cycles and then again repeat her MRI.  Marland Kitchen    [name], MD 11/8/20241:17 PM

## 2023-01-05 NOTE — Telephone Encounter (Signed)
Patient called with questions concerning her admission to [facility] next week for chemotherapy. I verified the address of the hospital and told her that's where she would be admitted. Also, I told her that admitting would call her cell phone when there was a bed available. I can not give you a time today for the admission. She verbalized understanding.

## 2023-01-05 NOTE — Patient Instructions (Signed)

## 2023-01-08 ENCOUNTER — Inpatient Hospital Stay (HOSPITAL_COMMUNITY)
Admission: RE | Admit: 2023-01-08 | Discharge: 2023-01-13 | DRG: 847 | Disposition: A | Discharge status: Home / Self Care | Payer: Medicare Other | Source: Ambulatory Visit | Attending: Hematology & Oncology | Admitting: Hematology & Oncology

## 2023-01-08 ENCOUNTER — Inpatient Hospital Stay (HOSPITAL_COMMUNITY): Payer: Medicare Other

## 2023-01-08 ENCOUNTER — Other Ambulatory Visit: Payer: Self-pay

## 2023-01-08 ENCOUNTER — Other Ambulatory Visit: Payer: Self-pay | Admitting: Hematology & Oncology

## 2023-01-08 ENCOUNTER — Encounter (HOSPITAL_COMMUNITY): Payer: Self-pay | Admitting: Hematology & Oncology

## 2023-01-08 DIAGNOSIS — C8580 Other specified types of non-Hodgkin lymphoma, unspecified site: Principal | ICD-10-CM

## 2023-01-08 DIAGNOSIS — C7931 Secondary malignant neoplasm of brain: Secondary | ICD-10-CM

## 2023-01-08 DIAGNOSIS — C8332 Diffuse large B-cell lymphoma, intrathoracic lymph nodes: Secondary | ICD-10-CM

## 2023-01-08 DIAGNOSIS — M1611 Unilateral primary osteoarthritis, right hip: Secondary | ICD-10-CM

## 2023-01-08 DIAGNOSIS — C8330 Diffuse large B-cell lymphoma, unspecified site: Secondary | ICD-10-CM

## 2023-01-08 DIAGNOSIS — Z888 Allergy status to other drugs, medicaments and biological substances status: Secondary | ICD-10-CM

## 2023-01-08 DIAGNOSIS — Z882 Allergy status to sulfonamides status: Secondary | ICD-10-CM | POA: Diagnosis not present

## 2023-01-08 DIAGNOSIS — C8510 Unspecified B-cell lymphoma, unspecified site: Secondary | ICD-10-CM | POA: Diagnosis not present

## 2023-01-08 DIAGNOSIS — Z452 Encounter for adjustment and management of vascular access device: Secondary | ICD-10-CM | POA: Diagnosis not present

## 2023-01-08 DIAGNOSIS — M1612 Unilateral primary osteoarthritis, left hip: Secondary | ICD-10-CM

## 2023-01-08 DIAGNOSIS — Z885 Allergy status to narcotic agent status: Secondary | ICD-10-CM

## 2023-01-08 DIAGNOSIS — C8590 Non-Hodgkin lymphoma, unspecified, unspecified site: Secondary | ICD-10-CM | POA: Diagnosis present

## 2023-01-08 DIAGNOSIS — Z5111 Encounter for antineoplastic chemotherapy: Principal | ICD-10-CM

## 2023-01-08 DIAGNOSIS — R059 Cough, unspecified: Secondary | ICD-10-CM | POA: Diagnosis not present

## 2023-01-08 DIAGNOSIS — C8331 Diffuse large B-cell lymphoma, lymph nodes of head, face, and neck: Principal | ICD-10-CM

## 2023-01-08 DIAGNOSIS — D509 Iron deficiency anemia, unspecified: Secondary | ICD-10-CM | POA: Diagnosis not present

## 2023-01-08 DIAGNOSIS — Z9221 Personal history of antineoplastic chemotherapy: Secondary | ICD-10-CM

## 2023-01-08 DIAGNOSIS — C8339 Primary central nervous system lymphoma: Secondary | ICD-10-CM

## 2023-01-08 DIAGNOSIS — Z5181 Encounter for therapeutic drug level monitoring: Secondary | ICD-10-CM | POA: Diagnosis not present

## 2023-01-08 DIAGNOSIS — Z8572 Personal history of non-Hodgkin lymphomas: Secondary | ICD-10-CM | POA: Diagnosis not present

## 2023-01-08 DIAGNOSIS — J40 Bronchitis, not specified as acute or chronic: Secondary | ICD-10-CM | POA: Diagnosis not present

## 2023-01-08 LAB — CBC WITH DIFFERENTIAL/PLATELET
Abs Immature Granulocytes: 0.02 10*3/uL (ref 0.00–0.07)
Basophils Absolute: 0 10*3/uL (ref 0.0–0.1)
Basophils Relative: 1 %
Eosinophils Absolute: 0.1 10*3/uL (ref 0.0–0.5)
Eosinophils Relative: 3 %
HCT: 33.4 % — ABNORMAL LOW (ref 36.0–46.0)
Hemoglobin: 11.4 g/dL — ABNORMAL LOW (ref 12.0–15.0)
Immature Granulocytes: 1 %
Lymphocytes Relative: 29 %
Lymphs Abs: 1.3 10*3/uL (ref 0.7–4.0)
MCH: 33.4 pg (ref 26.0–34.0)
MCHC: 34.1 g/dL (ref 30.0–36.0)
MCV: 97.9 fL (ref 80.0–100.0)
Monocytes Absolute: 0.6 10*3/uL (ref 0.1–1.0)
Monocytes Relative: 13 %
Neutro Abs: 2.3 10*3/uL (ref 1.7–7.7)
Neutrophils Relative %: 53 %
Platelets: 192 10*3/uL (ref 150–400)
RBC: 3.41 MIL/uL — ABNORMAL LOW (ref 3.87–5.11)
RDW: 13.9 % (ref 11.5–15.5)
WBC: 4.4 10*3/uL (ref 4.0–10.5)
nRBC: 0 % (ref 0.0–0.2)

## 2023-01-08 LAB — URINALYSIS, ROUTINE W REFLEX MICROSCOPIC
Bilirubin Urine: NEGATIVE
Glucose, UA: NEGATIVE mg/dL
Hgb urine dipstick: NEGATIVE
Ketones, ur: NEGATIVE mg/dL
Leukocytes,Ua: NEGATIVE
Nitrite: NEGATIVE
Protein, ur: NEGATIVE mg/dL
Specific Gravity, Urine: 1.005 (ref 1.005–1.030)
pH: 7 (ref 5.0–8.0)

## 2023-01-08 LAB — COMPREHENSIVE METABOLIC PANEL
ALT: 19 U/L (ref 0–44)
AST: 20 U/L (ref 15–41)
Albumin: 4.1 g/dL (ref 3.5–5.0)
Alkaline Phosphatase: 56 U/L (ref 38–126)
Anion gap: 9 (ref 5–15)
BUN: 14 mg/dL (ref 8–23)
CO2: 27 mmol/L (ref 22–32)
Calcium: 9.8 mg/dL (ref 8.9–10.3)
Chloride: 101 mmol/L (ref 98–111)
Creatinine, Ser: 0.71 mg/dL (ref 0.44–1.00)
GFR, Estimated: >60 mL/min (ref >60)
Glucose, Bld: 110 mg/dL — ABNORMAL HIGH (ref 70–99)
Potassium: 4 mmol/L (ref 3.5–5.1)
Sodium: 137 mmol/L (ref 135–145)
Total Bilirubin: 0.5 mg/dL (ref <1.2)
Total Protein: 6 g/dL — ABNORMAL LOW (ref 6.5–8.1)

## 2023-01-08 LAB — MAGNESIUM: Magnesium: 2 mg/dL (ref 1.7–2.4)

## 2023-01-08 MED ORDER — ONDANSETRON 4 MG PO TBDP: 8.0000 mg | ORAL_TABLET | Freq: Three times a day (TID) | ORAL | Status: DC | PRN

## 2023-01-08 MED ORDER — GABAPENTIN 300 MG PO CAPS
300.0000 mg | ORAL_CAPSULE | Freq: Three times a day (TID) | ORAL | Status: DC
  Administered 2023-01-08 – 2023-01-10 (×7): 300 mg via ORAL
  Filled 2023-01-08 (×7): qty 1

## 2023-01-08 MED ORDER — SODIUM CHLORIDE 0.9 % IV SOLN: INTRAVENOUS | Status: DC

## 2023-01-08 MED ORDER — SODIUM BICARBONATE 8.4 % IV SOLN
INTRAVENOUS | Status: DC
  Filled 2023-01-08 (×6): qty 150

## 2023-01-08 MED ORDER — SODIUM CHLORIDE 0.9 % IV SOLN
500.0000 mg/m2 | Freq: Once | INTRAVENOUS | Status: AC
Start: 2023-01-08 — End: 2023-01-08
  Administered 2023-01-08: 900 mg via INTRAVENOUS
  Filled 2023-01-08: qty 90

## 2023-01-08 MED ORDER — GUAIFENESIN-DM 100-10 MG/5ML PO SYRP: 10.0000 mL | ORAL_SOLUTION | ORAL | Status: DC | PRN

## 2023-01-08 MED ORDER — ONDANSETRON 4 MG PO TBDP: 4.0000 mg | ORAL_TABLET | Freq: Three times a day (TID) | ORAL | Status: DC | PRN

## 2023-01-08 MED ORDER — SODIUM BICARBONATE/SODIUM CHLORIDE MOUTHWASH
15.0000 | OROMUCOSAL | Status: DC
  Administered 2023-01-08 – 2023-01-13 (×27): 15 via OROMUCOSAL
  Filled 2023-01-08: qty 1000

## 2023-01-08 MED ORDER — ACETAMINOPHEN 325 MG PO TABS: 650.0000 mg | ORAL_TABLET | ORAL | Status: DC | PRN

## 2023-01-08 MED ORDER — DIPHENHYDRAMINE HCL 25 MG PO CAPS
25.0000 mg | ORAL_CAPSULE | Freq: Once | ORAL | Status: AC
  Administered 2023-01-08: 25 mg via ORAL
  Filled 2023-01-08: qty 1

## 2023-01-08 MED ORDER — SODIUM BICARBONATE 8.4 % IV SOLN
INTRAVENOUS | Status: DC
  Filled 2023-01-08: qty 1000

## 2023-01-08 MED ORDER — ENOXAPARIN SODIUM 40 MG/0.4ML IJ SOSY
40.0000 mg | PREFILLED_SYRINGE | INTRAMUSCULAR | Status: DC
  Administered 2023-01-08 – 2023-01-13 (×6): 40 mg via SUBCUTANEOUS
  Filled 2023-01-08 (×6): qty 0.4

## 2023-01-08 MED ORDER — ALUM & MAG HYDROXIDE-SIMETH 200-200-20 MG/5ML PO SUSP: 60.0000 mL | ORAL | Status: DC | PRN

## 2023-01-08 MED ORDER — ONDANSETRON HCL 4 MG/2ML IJ SOLN
4.0000 mg | Freq: Three times a day (TID) | INTRAMUSCULAR | Status: DC | PRN
  Administered 2023-01-09: 4 mg via INTRAVENOUS
  Filled 2023-01-08: qty 2

## 2023-01-08 MED ORDER — ACETAMINOPHEN 325 MG PO TABS
650.0000 mg | ORAL_TABLET | ORAL | Status: DC | PRN
  Administered 2023-01-09 – 2023-01-12 (×3): 650 mg via ORAL
  Filled 2023-01-08 (×3): qty 2

## 2023-01-08 MED ORDER — BIOTENE DRY MOUTH MT LIQD
15.0000 mL | Freq: Four times a day (QID) | OROMUCOSAL | Status: DC
  Administered 2023-01-11 – 2023-01-13 (×10): 15 mL via OROMUCOSAL

## 2023-01-08 MED ORDER — ACETAMINOPHEN 325 MG PO TABS
650.0000 mg | ORAL_TABLET | Freq: Once | ORAL | Status: AC
  Administered 2023-01-08: 650 mg via ORAL
  Filled 2023-01-08: qty 2

## 2023-01-08 MED ORDER — SODIUM CHLORIDE 0.9 % IV SOLN: 8.0000 mg | Freq: Three times a day (TID) | INTRAVENOUS | Status: DC | PRN

## 2023-01-08 MED ORDER — ONDANSETRON HCL 4 MG PO TABS: 4.0000 mg | ORAL_TABLET | Freq: Three times a day (TID) | ORAL | Status: DC | PRN

## 2023-01-08 MED ORDER — SENNOSIDES-DOCUSATE SODIUM 8.6-50 MG PO TABS: 1.0000 | ORAL_TABLET | Freq: Every evening | ORAL | Status: DC | PRN

## 2023-01-08 NOTE — Progress Notes (Signed)
Hematology and Oncology Follow Up Visit  Ariel [name] [id number] January 10, 1956 67 y.o. 01/08/2023   Principle Diagnosis:  Diffuse large cell non-Hodgkin's lymphoma-CNS involvement -- relapsed   Past Therapy: Status post chemotherapy with R-ICE/R-MTV --  s/p cycle #4 MATRIX -- s/p cycle #4 -- start on 05/02/2021 --completed on 08/06/2021 Neulasta 6 mg subcu post chemotherapy MTV- s/p cycle #1 -- given on 01/22/204   Current Therapy:        Imbruvica/Rituxan -- started 05/02/2022, s/p cycle #5 -DC on 07/16/2022 --DC on 10/29/2022 High-dose methotrexate/Rituxan-cycle #2 given on 11/03/2022   Interim History:  Ariel [name] is here today for follow-up.  She is doing quite nicely.  More poorly is the fact that on her MRI of the brain, which she had on 12/18/2022, she had a very nice improvement in the recurrent lymphoma..  She feels great.  She really has had no specific complaints.  [facility] is [facility] the [facility].  She enjoys it there.  She is exercising.  She is eating well.  She is having no nausea or vomiting.  She is having no problems with cough or shortness of breath.  She is having no change in bowel or bladder habits.  There is been no rashes.  There is no weakness.  She was wondering  if she could have knee injections.  I told her that I saw no problems with her having knee injections.  Overall, I would have to say that her performance status is probably ECOG 1.  Medications:     Allergies:  Allergies  Allergen Reactions   Decadron [Dexamethasone] Other (See Comments)    Leg weakness Hallucinations   Plavix [Clopidogrel] Hives, Swelling and Other (See Comments)    Lip swelling    Prednisone Other (See Comments)    Steroids caused psychological issues  Hallucinations   Sulfa Antibiotics Itching and Rash   Codeine Nausea And Vomiting    Past Medical History, Surgical history, Social history, and Family History were reviewed and updated.  Review of Systems: Review of  Systems  Constitutional: Negative.   HENT: Negative.    Eyes: Negative.   Respiratory: Negative.    Cardiovascular: Negative.   Gastrointestinal: Negative.   Genitourinary: Negative.   Musculoskeletal: Negative.   Skin: Negative.   Neurological: Negative.   Endo/Heme/Allergies: Negative.   Psychiatric/Behavioral: Negative.       Physical Exam:  Vital signs are temperature 97.6.  Pulse 73.  Blood pressure 123/64.  Weight is 158 pounds.   Wt Readings from Last 3 Encounters:  01/08/23 158 lb (71.7 kg)  01/05/23 158 lb (71.7 kg)  11/30/22 148 lb (67.1 kg)    Physical Activity: Sufficiently Active (09/05/2022)   Exercise Vital Sign    Days of Exercise per Week: 6 days    Minutes of Exercise per Session: 40 min   Physical Exam Vitals reviewed.  HENT:     Head: Normocephalic and atraumatic.  Eyes:     Pupils: Pupils are equal, round, and reactive to light.  Cardiovascular:     Rate and Rhythm: Normal rate and regular rhythm.     Heart sounds: Normal heart sounds.  Pulmonary:     Effort: Pulmonary effort is normal.     Breath sounds: Normal breath sounds.  Abdominal:     General: Bowel sounds are normal.     Palpations: Abdomen is soft.  Musculoskeletal:        General: No tenderness or deformity. Normal range of motion.  Cervical back: Normal range of motion.  Lymphadenopathy:     Cervical: No cervical adenopathy.  Skin:    General: Skin is warm and dry.     Findings: No erythema or rash.  Neurological:     Mental Status: She is alert and oriented to person, place, and time.  Psychiatric:        Behavior: Behavior normal.        Thought Content: Thought content normal.        Judgment: Judgment normal.      Lab Results  Component Value Date   WBC 4.4 01/08/2023   HGB 11.4 (L) 01/08/2023   HCT 33.4 (L) 01/08/2023   MCV 97.9 01/08/2023   PLT 192 01/08/2023   Lab Results  Component Value Date   FERRITIN 944 (H) 10/16/2022   IRON 61 10/16/2022   TIBC  277 10/16/2022   UIBC 216 10/16/2022   IRONPCTSAT 22 10/16/2022   Lab Results  Component Value Date   RETICCTPCT 2.4 11/15/2021   RBC 3.41 (L) 01/08/2023   No results found for: "KPAFRELGTCHN", "LAMBDASER", "KAPLAMBRATIO" No results found for: "IGGSERUM", "IGA", "IGMSERUM" No results found for: "TOTALPROTELP", "ALBUMINELP", "A1GS", "A2GS", "BETS", "BETA2SER", "GAMS", "MSPIKE", "SPEI"   Chemistry      Component Value Date/Time   NA 137 01/08/2023 0925   K 4.0 01/08/2023 0925   CL 101 01/08/2023 0925   CO2 27 01/08/2023 0925   BUN 14 01/08/2023 0925   CREATININE 0.71 01/08/2023 0925   CREATININE 0.73 01/05/2023 1215   CREATININE 0.68 11/18/2012 1439      Component Value Date/Time   CALCIUM 9.8 01/08/2023 0925   ALKPHOS 56 01/08/2023 0925   AST 20 01/08/2023 0925   AST 16 01/05/2023 1215   ALT 19 01/08/2023 0925   ALT 15 01/05/2023 1215   BILITOT 0.5 01/08/2023 0925   BILITOT 0.4 01/05/2023 1215       Impression and Plan: Ariel [name] is a very pleasant 67 yo caucasian female with iron deficiency anemia and then was diagnosed with diffuse large cell non-Hodgkin's lymphoma that had traveled to her brain.  She now has had another relapse.  She had been on Rituxan/IMBRUVICA.  We went and stopped this.  We now have her on Rituxan/high-dose of methotrexate.  She is responding by the MRI.  I did talk to her at length about CAR-T therapy.  She still does not want to consider this.  I am not sure as to what the reservation is with this.  I just hate the fact that she might miss an opportunity to get into a longstanding remission.  We will have to get her admitted for her third cycle of high-dose methotrexate/Rituxan.  We will see about getting her admitted next week.  I would probably do 2 more cycles and then again repeat her MRI.  Marland Kitchen    [name], MD 11/11/20246:22 PM  ADDENDUM: Ariel [name] is admitted for cycle #3 of chemotherapy.  She is doing great.  She had a  wonderful weekend.  She is eating well.  There is no nausea or vomiting.  She is having no headache.  There is no bleeding.  There is no change in bowel or bladder habits.  She has had no mouth sores.  Her labs this admission look fine.  There is some white cell count of 4.4.  Hemoglobin 11.4.  Platelet count 192,000.  Sodium 137.  Potassium 4.0.  BUN 14 creatinine 0.71.  Calcium 9.8 with  an albumin of 4.1.  She we will receive Rituxan today.  Regarding alkalize her urine.  As always, Pharmacy really helps out whole bunch with this.  We will get her urine pH over 8.  Will see about doing methotrexate tomorrow.  We will then do leucovorin rescue.  Hopefully, if all is well, she will be able to go home over the weekend or at the latest, next Monday.  We will monitor her labs.  Her recent MRI of the brain looks fantastic.  As such, she has a nice response.  Again, she is so excited to be in the same room as she was last time.  She knows that she will get incredible care from everybody up on [address]  Christin Bach, MD  Psalm 34:9

## 2023-01-08 NOTE — Progress Notes (Signed)
67 YO female admitted for inpatient Rituxan and high-dose methotrexate chemotherapy. Pharmacy consulted to dose bicarb infusion to maintain a urine pH >/= 7 prior to methotrexate treatment.  Rituxan is incompatible with bicarb and there's no additional IV access at this time. After discussion with MD, will start bicarb infusion now at 69ml/hr and pause the infusion during Rituxan administration.  Will need at least 6 hrs of bicarb infusion to reflect steady state effect. Will order a UA 6 hrs after restart of bicarb infusion. Initial UA showing a urine pH of 7.0.

## 2023-01-09 ENCOUNTER — Other Ambulatory Visit: Payer: Self-pay

## 2023-01-09 DIAGNOSIS — C8510 Unspecified B-cell lymphoma, unspecified site: Secondary | ICD-10-CM | POA: Diagnosis not present

## 2023-01-09 LAB — COMPREHENSIVE METABOLIC PANEL
ALT: 17 U/L (ref 0–44)
AST: 17 U/L (ref 15–41)
Albumin: 3.4 g/dL — ABNORMAL LOW (ref 3.5–5.0)
Alkaline Phosphatase: 47 U/L (ref 38–126)
Anion gap: 8 (ref 5–15)
BUN: 11 mg/dL (ref 8–23)
CO2: 28 mmol/L (ref 22–32)
Calcium: 9 mg/dL (ref 8.9–10.3)
Chloride: 101 mmol/L (ref 98–111)
Creatinine, Ser: 0.67 mg/dL (ref 0.44–1.00)
GFR, Estimated: >60 mL/min (ref >60)
Glucose, Bld: 110 mg/dL — ABNORMAL HIGH (ref 70–99)
Potassium: 3.5 mmol/L (ref 3.5–5.1)
Sodium: 137 mmol/L (ref 135–145)
Total Bilirubin: 0.5 mg/dL (ref <1.2)
Total Protein: 5.3 g/dL — ABNORMAL LOW (ref 6.5–8.1)

## 2023-01-09 LAB — URINALYSIS, DIPSTICK ONLY
Bilirubin Urine: NEGATIVE
Glucose, UA: NEGATIVE mg/dL
Hgb urine dipstick: NEGATIVE
Ketones, ur: NEGATIVE mg/dL
Leukocytes,Ua: NEGATIVE
Nitrite: NEGATIVE
Protein, ur: NEGATIVE mg/dL
Specific Gravity, Urine: 1.006 (ref 1.005–1.030)
pH: 8 (ref 5.0–8.0)

## 2023-01-09 LAB — CBC
HCT: 30.8 % — ABNORMAL LOW (ref 36.0–46.0)
Hemoglobin: 10.4 g/dL — ABNORMAL LOW (ref 12.0–15.0)
MCH: 33 pg (ref 26.0–34.0)
MCHC: 33.8 g/dL (ref 30.0–36.0)
MCV: 97.8 fL (ref 80.0–100.0)
Platelets: 181 10*3/uL (ref 150–400)
RBC: 3.15 MIL/uL — ABNORMAL LOW (ref 3.87–5.11)
RDW: 14 % (ref 11.5–15.5)
WBC: 3.1 10*3/uL — ABNORMAL LOW (ref 4.0–10.5)
nRBC: 0 % (ref 0.0–0.2)

## 2023-01-09 MED ORDER — SODIUM CHLORIDE 0.9 % IV SOLN
Freq: Once | INTRAVENOUS | Status: AC
  Administered 2023-01-09: 16 mg via INTRAVENOUS
  Filled 2023-01-09: qty 8

## 2023-01-09 MED ORDER — METHOTREXATE SODIUM (PF) CHEMO INJECTION 1 GM/40ML
4.0000 g/m2 | Freq: Once | INTRAMUSCULAR | Status: DC
  Filled 2023-01-09: qty 288

## 2023-01-09 MED ORDER — METHOTREXATE SODIUM (PF) CHEMO INJECTION 1 GM/40ML: 4.0000 g/m2 | Freq: Once | INTRAVENOUS | Status: DC

## 2023-01-09 MED ORDER — SODIUM BICARBONATE 8.4 % IV SOLN: INTRAVENOUS | Status: DC

## 2023-01-09 MED ORDER — CHLORHEXIDINE GLUCONATE CLOTH 2 % EX PADS
6.0000 | MEDICATED_PAD | Freq: Every day | CUTANEOUS | Status: DC
  Administered 2023-01-10 – 2023-01-13 (×4): 6 via TOPICAL

## 2023-01-09 MED ORDER — SODIUM CHLORIDE 0.9 % IV SOLN: INTRAVENOUS | Status: DC

## 2023-01-09 MED ORDER — METHOTREXATE CHEMO INJECTION (PF) 1 GM **50 MG/ML**
4.0000 g/m2 | Freq: Once | INTRAVENOUS | Status: AC
  Administered 2023-01-09: 7.2 g via INTRAVENOUS
  Filled 2023-01-09: qty 144

## 2023-01-09 NOTE — Progress Notes (Signed)
[name] had no problems with the Rituxan yesterday.  Hopefully, she will be able to have methotrexate today.  I am awaiting the urine pH.  She is getting alkalized IV fluid.  She has had no nausea or vomiting.  There is no headache.  There is no mouth sores.  She has had no cough or shortness of breath.  She is eating well.  She is having no leg pain.  She has little bit of hip pain bilaterally.  Her electrolytes show sodium 137.  Potassium 3.5.  BUN 11 creatinine 0.67.  Calcium 9.0 with an albumin of 3.4.  Her white cell count 3.1.  Hemoglobin 10.4.  Platelet count 181,000.  Her vital signs are temperature 98.1.  Pulse 72.  Blood pressure 123/63.  Her head and neck exam shows no oral lesions.  There is no adenopathy in the neck.  Lungs are clear bilaterally.  Cardiac exam regular rate and rhythm.  Abdomen is soft.  Bowel sounds are present.  There is no fluid wave.  There is no palpable liver or spleen tip.  Extremity shows no clubbing, cyanosis or edema.  Neurological exam shows no focal neurological deficits.  Again, Ariel [name] will hopefully get methotrexate today.  She will then have the leucovorin rescue.  We will continue to follow her along.  I know that she is incredibly happy and pleased with the wonderful staff up on [address]  Christin Bach, MD  Psalm 68:19

## 2023-01-10 DIAGNOSIS — C8510 Unspecified B-cell lymphoma, unspecified site: Secondary | ICD-10-CM | POA: Diagnosis not present

## 2023-01-10 DIAGNOSIS — C8331 Diffuse large B-cell lymphoma, lymph nodes of head, face, and neck: Secondary | ICD-10-CM | POA: Diagnosis not present

## 2023-01-10 DIAGNOSIS — Z5111 Encounter for antineoplastic chemotherapy: Secondary | ICD-10-CM | POA: Diagnosis not present

## 2023-01-10 DIAGNOSIS — D509 Iron deficiency anemia, unspecified: Secondary | ICD-10-CM | POA: Diagnosis not present

## 2023-01-10 DIAGNOSIS — Z882 Allergy status to sulfonamides status: Secondary | ICD-10-CM | POA: Diagnosis not present

## 2023-01-10 LAB — CBC WITH DIFFERENTIAL/PLATELET
Abs Immature Granulocytes: 0.01 10*3/uL (ref 0.00–0.07)
Basophils Absolute: 0 10*3/uL (ref 0.0–0.1)
Basophils Relative: 0 %
Eosinophils Absolute: 0.1 10*3/uL (ref 0.0–0.5)
Eosinophils Relative: 3 %
HCT: 34.2 % — ABNORMAL LOW (ref 36.0–46.0)
Hemoglobin: 11.9 g/dL — ABNORMAL LOW (ref 12.0–15.0)
Immature Granulocytes: 0 %
Lymphocytes Relative: 24 %
Lymphs Abs: 1.2 10*3/uL (ref 0.7–4.0)
MCH: 33.6 pg (ref 26.0–34.0)
MCHC: 34.8 g/dL (ref 30.0–36.0)
MCV: 96.6 fL (ref 80.0–100.0)
Monocytes Absolute: 0.5 10*3/uL (ref 0.1–1.0)
Monocytes Relative: 11 %
Neutro Abs: 3.1 10*3/uL (ref 1.7–7.7)
Neutrophils Relative %: 62 %
Platelets: 231 10*3/uL (ref 150–400)
RBC: 3.54 MIL/uL — ABNORMAL LOW (ref 3.87–5.11)
RDW: 13.6 % (ref 11.5–15.5)
WBC: 4.9 10*3/uL (ref 4.0–10.5)
nRBC: 0 % (ref 0.0–0.2)

## 2023-01-10 LAB — COMPREHENSIVE METABOLIC PANEL
ALT: 45 U/L — ABNORMAL HIGH (ref 0–44)
AST: 36 U/L (ref 15–41)
Albumin: 3.8 g/dL (ref 3.5–5.0)
Alkaline Phosphatase: 47 U/L (ref 38–126)
Anion gap: 8 (ref 5–15)
BUN: 13 mg/dL (ref 8–23)
CO2: 30 mmol/L (ref 22–32)
Calcium: 9.1 mg/dL (ref 8.9–10.3)
Chloride: 98 mmol/L (ref 98–111)
Creatinine, Ser: 0.72 mg/dL (ref 0.44–1.00)
GFR, Estimated: >60 mL/min (ref >60)
Glucose, Bld: 100 mg/dL — ABNORMAL HIGH (ref 70–99)
Potassium: 3.4 mmol/L — ABNORMAL LOW (ref 3.5–5.1)
Sodium: 136 mmol/L (ref 135–145)
Total Bilirubin: 0.6 mg/dL (ref <1.2)
Total Protein: 5.7 g/dL — ABNORMAL LOW (ref 6.5–8.1)

## 2023-01-10 LAB — URINALYSIS, DIPSTICK ONLY
Bilirubin Urine: NEGATIVE
Glucose, UA: NEGATIVE mg/dL
Hgb urine dipstick: NEGATIVE
Ketones, ur: NEGATIVE mg/dL
Leukocytes,Ua: NEGATIVE
Nitrite: NEGATIVE
Protein, ur: NEGATIVE mg/dL
Specific Gravity, Urine: 1.008 (ref 1.005–1.030)
pH: 8 (ref 5.0–8.0)

## 2023-01-10 LAB — METHOTREXATE

## 2023-01-10 MED ORDER — SODIUM CHLORIDE 0.9 % IV SOLN: 8.0000 mg | Freq: Three times a day (TID) | INTRAVENOUS | Status: DC | PRN

## 2023-01-10 MED ORDER — ONDANSETRON 4 MG PO TBDP: 4.0000 mg | ORAL_TABLET | Freq: Three times a day (TID) | ORAL | Status: AC | PRN

## 2023-01-10 MED ORDER — ONDANSETRON HCL 4 MG PO TABS: 4.0000 mg | ORAL_TABLET | Freq: Three times a day (TID) | ORAL | Status: AC | PRN

## 2023-01-10 MED ORDER — SODIUM CHLORIDE 0.9 % IV SOLN
8.0000 mg | Freq: Three times a day (TID) | INTRAVENOUS | Status: DC
  Filled 2023-01-10 (×2): qty 4

## 2023-01-10 MED ORDER — SODIUM CHLORIDE 0.9 % IV SOLN: 8.0000 mg | Freq: Three times a day (TID) | INTRAVENOUS | Status: AC | PRN

## 2023-01-10 MED ORDER — ONDANSETRON 4 MG PO TBDP
4.0000 mg | ORAL_TABLET | Freq: Three times a day (TID) | ORAL | Status: DC
  Administered 2023-01-10: 4 mg via ORAL
  Filled 2023-01-10: qty 1

## 2023-01-10 MED ORDER — ONDANSETRON HCL 4 MG PO TABS: 4.0000 mg | ORAL_TABLET | Freq: Three times a day (TID) | ORAL | Status: DC

## 2023-01-10 MED ORDER — SODIUM CHLORIDE 0.9 % IV SOLN
Freq: Once | INTRAVENOUS | Status: AC
  Administered 2023-01-10: 16 mg via INTRAVENOUS
  Filled 2023-01-10: qty 8

## 2023-01-10 MED ORDER — ONDANSETRON 4 MG PO TBDP: 4.0000 mg | ORAL_TABLET | Freq: Three times a day (TID) | ORAL | Status: DC | PRN

## 2023-01-10 MED ORDER — LEUCOVORIN CALCIUM INJECTION 100 MG
15.0000 mg | Freq: Four times a day (QID) | INTRAMUSCULAR | Status: AC
  Administered 2023-01-10 – 2023-01-11 (×4): 16 mg via INTRAVENOUS
  Filled 2023-01-10 (×7): qty 0.8

## 2023-01-10 MED ORDER — ONDANSETRON HCL 4 MG PO TABS: 4.0000 mg | ORAL_TABLET | Freq: Three times a day (TID) | ORAL | Status: DC | PRN

## 2023-01-10 MED ORDER — SODIUM CHLORIDE 0.9 % IV SOLN
150.0000 mg | Freq: Once | INTRAVENOUS | Status: AC
  Administered 2023-01-10: 150 mg via INTRAVENOUS
  Filled 2023-01-10: qty 5

## 2023-01-10 NOTE — Progress Notes (Signed)
Ariel [name] tolerated her chemotherapy well.  She got methotrexate yesterday.  She had little bit of nausea last evening.  This resolved with a dose of antinausea medicine.  She slept well.  We will start her leucovorin now.  I think methotrexate level tomorrow will be the important 1.  Her labs look okay.  She was has elevated LFTs with the methotrexate.  I am sure that they will go up.  She might be a little constipated.  We will have to watch out for this.  She has been very diligent with her oral rinses.  She is trying to eat what she can.  She has been very cautious with eating too much at 1 time.  Her labs show sodium 136.  Potassium 3.4.  BUN 13 creatinine 0.72.  Calcium 9.1 with an albumin of 3.8.  SGPT 45 SGOT 36.  Her white cell count is 4.9.  Hemoglobin 11.9.  Platelet count 231,000.  Her vital signs are temperature 97.8.  Pulse 88.  Blood pressure 92/62.  Her head neck exam shows no mucositis.  There is no adenopathy in the neck.  Lungs are clear bilaterally.  Cardiac exam regular rate and rhythm.  Abdomen is soft.  Bowel sounds are present.  There is no guarding or rebound tenderness.  Extremity shows no clubbing, cyanosis or edema.  She has good range of motion of her joints.  Neurological exam is nonfocal.  Skin exam shows no rashes.   [name] completed cycle 3 of her Rituxan/high-dose methotrexate.  We now are doing the leucovorin rescue.  We will see what her methotrexate levels are.  I know that Pharmacy does a fantastic job in helping me manage the leucovorin dosing.  Again her LFTs will go up.  I suspect that she should be able to clear out the methotrexate in about 4 days.  I know she really enjoys the staff up on [address]   Christin Bach, MD  2 Cor 12:9-10

## 2023-01-10 NOTE — Plan of Care (Signed)
  Problem: Education: Goal: Knowledge of General Education information will improve Description: Including pain rating scale, medication(s)/side effects and non-pharmacologic comfort measures Outcome: Progressing   Problem: Health Behavior/Discharge Planning: Goal: Ability to manage health-related needs will improve Outcome: Progressing   Problem: Clinical Measurements: Goal: Ability to maintain clinical measurements within normal limits will improve Outcome: Progressing Goal: Will remain free from infection Outcome: Progressing Goal: Diagnostic test results will improve Outcome: Progressing Goal: Respiratory complications will improve Outcome: Progressing Goal: Cardiovascular complication will be avoided Outcome: Progressing   Problem: Activity: Goal: Risk for activity intolerance will decrease Outcome: Progressing   Problem: Nutrition: Goal: Adequate nutrition will be maintained Outcome: Progressing   Problem: Coping: Goal: Level of anxiety will decrease Outcome: Progressing   Problem: Elimination: Goal: Will not experience complications related to bowel motility Outcome: Progressing Goal: Will not experience complications related to urinary retention Outcome: Progressing   Problem: Pain Management: Goal: General experience of comfort will improve Outcome: Progressing   Problem: Safety: Goal: Ability to remain free from injury will improve Outcome: Progressing   Problem: Skin Integrity: Goal: Risk for impaired skin integrity will decrease Outcome: Progressing   Problem: Education: Goal: Knowledge of the prescribed therapeutic regimen will improve Outcome: Progressing   Problem: Activity: Goal: Ability to implement measures to reduce episodes of fatigue will improve Outcome: Progressing   Problem: Bowel/Gastric: Goal: Will not experience complications related to bowel motility Outcome: Progressing   Problem: Coping: Goal: Ability to identify and  develop effective coping behavior will improve Outcome: Progressing   Problem: Nutritional: Goal: Maintenance of adequate nutrition will improve Outcome: Progressing   Problem: Education: Goal: Knowledge of the prescribed therapeutic regimen will improve Outcome: Progressing   Problem: Activity: Goal: Ability to perform activities at highest level will improve Outcome: Progressing   Problem: Bowel/Gastric: Goal: Occurrences of nausea will decrease Outcome: Progressing Goal: Bowel function will improve Outcome: Progressing   Problem: Nutritional: Goal: Maintenance of adequate nutrition will improve Outcome: Progressing   Problem: Clinical Measurements: Goal: Will remain free from infection Outcome: Progressing   Problem: Skin Integrity: Goal: Status of oral mucous membranes will improve Outcome: Progressing

## 2023-01-10 NOTE — Progress Notes (Signed)
Mobility Specialist - Progress Note   01/10/23 1108  Mobility  Activity Ambulated with assistance in hallway  Level of Assistance Independent  Assistive Device Other (Comment) (IV Pole)  Distance Ambulated (ft) 500 ft  Activity Response Tolerated well  Mobility Referral Yes  $Mobility charge 1 Mobility  Mobility Specialist Start Time (ACUTE ONLY) 1047  Mobility Specialist Stop Time (ACUTE ONLY) 1055  Mobility Specialist Time Calculation (min) (ACUTE ONLY) 8 min   Pt received in bed and agreeable to mobility. Prior to ambulating, pt requested assistance to the bathroom. No complaints during session.  Pt to bed after session with all needs met.    [facility]

## 2023-01-11 ENCOUNTER — Encounter (HOSPITAL_COMMUNITY): Payer: Self-pay | Admitting: Hematology & Oncology

## 2023-01-11 DIAGNOSIS — C8332 Diffuse large B-cell lymphoma, intrathoracic lymph nodes: Secondary | ICD-10-CM | POA: Diagnosis not present

## 2023-01-11 LAB — CBC WITH DIFFERENTIAL/PLATELET
Abs Immature Granulocytes: 0.03 10*3/uL (ref 0.00–0.07)
Basophils Absolute: 0 10*3/uL (ref 0.0–0.1)
Basophils Relative: 0 %
Eosinophils Absolute: 0 10*3/uL (ref 0.0–0.5)
Eosinophils Relative: 0 %
HCT: 31.4 % — ABNORMAL LOW (ref 36.0–46.0)
Hemoglobin: 10.5 g/dL — ABNORMAL LOW (ref 12.0–15.0)
Immature Granulocytes: 0 %
Lymphocytes Relative: 9 %
Lymphs Abs: 0.7 10*3/uL (ref 0.7–4.0)
MCH: 33 pg (ref 26.0–34.0)
MCHC: 33.4 g/dL (ref 30.0–36.0)
MCV: 98.7 fL (ref 80.0–100.0)
Monocytes Absolute: 0.6 10*3/uL (ref 0.1–1.0)
Monocytes Relative: 8 %
Neutro Abs: 6.1 10*3/uL (ref 1.7–7.7)
Neutrophils Relative %: 83 %
Platelets: 173 10*3/uL (ref 150–400)
RBC: 3.18 MIL/uL — ABNORMAL LOW (ref 3.87–5.11)
RDW: 13.6 % (ref 11.5–15.5)
WBC: 7.5 10*3/uL (ref 4.0–10.5)
nRBC: 0 % (ref 0.0–0.2)

## 2023-01-11 LAB — COMPREHENSIVE METABOLIC PANEL
ALT: 56 U/L — ABNORMAL HIGH (ref 0–44)
AST: 41 U/L (ref 15–41)
Albumin: 3.5 g/dL (ref 3.5–5.0)
Alkaline Phosphatase: 47 U/L (ref 38–126)
Anion gap: 8 (ref 5–15)
BUN: 25 mg/dL — ABNORMAL HIGH (ref 8–23)
CO2: 31 mmol/L (ref 22–32)
Calcium: 9 mg/dL (ref 8.9–10.3)
Chloride: 96 mmol/L — ABNORMAL LOW (ref 98–111)
Creatinine, Ser: 1.43 mg/dL — ABNORMAL HIGH (ref 0.44–1.00)
GFR, Estimated: 40 mL/min — ABNORMAL LOW (ref >60)
Glucose, Bld: 161 mg/dL — ABNORMAL HIGH (ref 70–99)
Potassium: 3.3 mmol/L — ABNORMAL LOW (ref 3.5–5.1)
Sodium: 135 mmol/L (ref 135–145)
Total Bilirubin: 0.5 mg/dL (ref <1.2)
Total Protein: 5.5 g/dL — ABNORMAL LOW (ref 6.5–8.1)

## 2023-01-11 LAB — URINALYSIS, DIPSTICK ONLY
Bilirubin Urine: NEGATIVE
Glucose, UA: NEGATIVE mg/dL
Hgb urine dipstick: NEGATIVE
Ketones, ur: NEGATIVE mg/dL
Leukocytes,Ua: NEGATIVE
Nitrite: NEGATIVE
Protein, ur: 30 mg/dL — AB
Specific Gravity, Urine: 1.005 (ref 1.005–1.030)
pH: 8 (ref 5.0–8.0)

## 2023-01-11 LAB — METHOTREXATE: Methotrexate: 0.9

## 2023-01-11 MED ORDER — SODIUM CHLORIDE 0.9 % IV SOLN
Freq: Once | INTRAVENOUS | Status: AC
  Administered 2023-01-11: 16 mg via INTRAVENOUS
  Filled 2023-01-11: qty 8

## 2023-01-11 MED ORDER — POTASSIUM CHLORIDE CRYS ER 20 MEQ PO TBCR
40.0000 meq | EXTENDED_RELEASE_TABLET | Freq: Once | ORAL | Status: AC
  Administered 2023-01-11: 40 meq via ORAL
  Filled 2023-01-11: qty 2

## 2023-01-11 MED ORDER — LEUCOVORIN CALCIUM INJECTION 100 MG
15.0000 mg | Freq: Four times a day (QID) | INTRAMUSCULAR | Status: AC
  Administered 2023-01-11 – 2023-01-12 (×4): 16 mg via INTRAVENOUS
  Filled 2023-01-11 (×7): qty 0.8

## 2023-01-11 MED ORDER — GABAPENTIN 300 MG PO CAPS
300.0000 mg | ORAL_CAPSULE | Freq: Three times a day (TID) | ORAL | Status: DC
  Administered 2023-01-11 – 2023-01-13 (×5): 300 mg via ORAL
  Filled 2023-01-11 (×5): qty 1

## 2023-01-11 NOTE — Progress Notes (Signed)
Mobility Specialist - Progress Note   01/11/23 1103  Mobility  Activity Ambulated independently in hallway  Level of Assistance Independent  Assistive Device None  Distance Ambulated (ft) 500 ft  Activity Response Tolerated well  Mobility Referral Yes  $Mobility charge 1 Mobility  Mobility Specialist Start Time (ACUTE ONLY) 1051  Mobility Specialist Stop Time (ACUTE ONLY) 1101  Mobility Specialist Time Calculation (min) (ACUTE ONLY) 10 min   Pt received in bed and agreeable to mobility. No complaints during session. Pt to bed after session with all needs met.      [facility]

## 2023-01-11 NOTE — Plan of Care (Signed)
  Problem: Education: Goal: Knowledge of General Education information will improve Description: Including pain rating scale, medication(s)/side effects and non-pharmacologic comfort measures Outcome: Progressing   Problem: Health Behavior/Discharge Planning: Goal: Ability to manage health-related needs will improve Outcome: Progressing   Problem: Clinical Measurements: Goal: Ability to maintain clinical measurements within normal limits will improve Outcome: Progressing Goal: Will remain free from infection Outcome: Progressing Goal: Diagnostic test results will improve Outcome: Progressing Goal: Respiratory complications will improve Outcome: Progressing Goal: Cardiovascular complication will be avoided Outcome: Progressing   Problem: Activity: Goal: Risk for activity intolerance will decrease Outcome: Progressing   Problem: Nutrition: Goal: Adequate nutrition will be maintained Outcome: Progressing   Problem: Coping: Goal: Level of anxiety will decrease Outcome: Progressing   Problem: Elimination: Goal: Will not experience complications related to bowel motility Outcome: Progressing Goal: Will not experience complications related to urinary retention Outcome: Progressing   Problem: Pain Management: Goal: General experience of comfort will improve Outcome: Progressing   Problem: Safety: Goal: Ability to remain free from injury will improve Outcome: Progressing   Problem: Skin Integrity: Goal: Risk for impaired skin integrity will decrease Outcome: Progressing   Problem: Education: Goal: Knowledge of the prescribed therapeutic regimen will improve Outcome: Progressing   Problem: Activity: Goal: Ability to implement measures to reduce episodes of fatigue will improve Outcome: Progressing   Problem: Bowel/Gastric: Goal: Will not experience complications related to bowel motility Outcome: Progressing   Problem: Coping: Goal: Ability to identify and  develop effective coping behavior will improve Outcome: Progressing   Problem: Nutritional: Goal: Maintenance of adequate nutrition will improve Outcome: Progressing   Problem: Education: Goal: Knowledge of the prescribed therapeutic regimen will improve Outcome: Progressing   Problem: Activity: Goal: Ability to perform activities at highest level will improve Outcome: Progressing   Problem: Bowel/Gastric: Goal: Occurrences of nausea will decrease Outcome: Progressing Goal: Bowel function will improve Outcome: Progressing   Problem: Nutritional: Goal: Maintenance of adequate nutrition will improve Outcome: Progressing   Problem: Clinical Measurements: Goal: Will remain free from infection Outcome: Progressing   Problem: Skin Integrity: Goal: Status of oral mucous membranes will improve Outcome: Progressing

## 2023-01-11 NOTE — Progress Notes (Signed)
So far, Ms. cross is doing well.  She had little bit of nausea yesterday.  She slept well last night.  She is out of bed a little bit.  We will see what her methotrexate level is today.  This will be key.  She has had no headache.  There is been no mouth sores.  She has been washing her mouth out 5 or 6 times a day.  She has had no diarrhea.  Her labs today show sodium 135.  Potassium is 3.3.  BUN 25 creatinine 1.43.  Her SGPT is 56 SGOT 41.  Her CBC shows white cell count 7.5.  Hemoglobin 10.5.  Platelet count 173,000.  She has had no problem with leg swelling.  She has had some aches in her joints, mostly her hips.  She had no cough or shortness of breath.  She had no headache.  There is no visual changes.  On her physical exam, her vital signs are temperature 99.1.  Pulse 91.  Blood pressure 120/52.  Her head neck exam shows no ocular or oral lesions.  There are no palpable cervical or supraclavicular lymph nodes.  Lungs are clear bilaterally.  Cardiac exam regular rate and rhythm.  There are no murmurs.  Abdomen is soft.  Bowel sounds are present.  There is no fluid wave.  There is no palpable liver or spleen tip.  Extremity shows no clubbing, cyanosis or edema.  Neurological exam to no focal neurological deficits.  We are awaiting the methotrexate level.  This will help determine when she will be able to go home.  Typically, she clears out the methotrexate quickly.  I noted that her renal function was up a little bit.  Like to increase the IV fluids.  Again I am sure this is all from the methotrexate.  The staff on [address] are doing a tremendous job with her.  She really is complementary of the staff.   Christin Bach, MD  1 Chronicles 4:9-10

## 2023-01-12 ENCOUNTER — Encounter (HOSPITAL_COMMUNITY): Payer: Self-pay | Admitting: Hematology & Oncology

## 2023-01-12 DIAGNOSIS — C8510 Unspecified B-cell lymphoma, unspecified site: Secondary | ICD-10-CM | POA: Diagnosis not present

## 2023-01-12 LAB — CBC WITH DIFFERENTIAL/PLATELET
Abs Immature Granulocytes: 0.02 10*3/uL (ref 0.00–0.07)
Basophils Absolute: 0 10*3/uL (ref 0.0–0.1)
Basophils Relative: 0 %
Eosinophils Absolute: 0.1 10*3/uL (ref 0.0–0.5)
Eosinophils Relative: 2 %
HCT: 29.9 % — ABNORMAL LOW (ref 36.0–46.0)
Hemoglobin: 10.1 g/dL — ABNORMAL LOW (ref 12.0–15.0)
Immature Granulocytes: 0 %
Lymphocytes Relative: 16 %
Lymphs Abs: 0.8 10*3/uL (ref 0.7–4.0)
MCH: 33.7 pg (ref 26.0–34.0)
MCHC: 33.8 g/dL (ref 30.0–36.0)
MCV: 99.7 fL (ref 80.0–100.0)
Monocytes Absolute: 0.2 10*3/uL (ref 0.1–1.0)
Monocytes Relative: 4 %
Neutro Abs: 3.8 10*3/uL (ref 1.7–7.7)
Neutrophils Relative %: 78 %
Platelets: 155 10*3/uL (ref 150–400)
RBC: 3 MIL/uL — ABNORMAL LOW (ref 3.87–5.11)
RDW: 13.6 % (ref 11.5–15.5)
WBC: 4.9 10*3/uL (ref 4.0–10.5)
nRBC: 0 % (ref 0.0–0.2)

## 2023-01-12 LAB — METHOTREXATE: Methotrexate: 0.15

## 2023-01-12 LAB — URINALYSIS, DIPSTICK ONLY
Bilirubin Urine: NEGATIVE
Glucose, UA: NEGATIVE mg/dL
Hgb urine dipstick: NEGATIVE
Ketones, ur: NEGATIVE mg/dL
Leukocytes,Ua: NEGATIVE
Nitrite: NEGATIVE
Protein, ur: NEGATIVE mg/dL
Specific Gravity, Urine: 1.006 (ref 1.005–1.030)
pH: 9 — ABNORMAL HIGH (ref 5.0–8.0)

## 2023-01-12 LAB — COMPREHENSIVE METABOLIC PANEL
ALT: 130 U/L — ABNORMAL HIGH (ref 0–44)
AST: 96 U/L — ABNORMAL HIGH (ref 15–41)
Albumin: 3.4 g/dL — ABNORMAL LOW (ref 3.5–5.0)
Alkaline Phosphatase: 41 U/L (ref 38–126)
Anion gap: 8 (ref 5–15)
BUN: 23 mg/dL (ref 8–23)
CO2: 34 mmol/L — ABNORMAL HIGH (ref 22–32)
Calcium: 8.9 mg/dL (ref 8.9–10.3)
Chloride: 96 mmol/L — ABNORMAL LOW (ref 98–111)
Creatinine, Ser: 1.24 mg/dL — ABNORMAL HIGH (ref 0.44–1.00)
GFR, Estimated: 47 mL/min — ABNORMAL LOW (ref >60)
Glucose, Bld: 168 mg/dL — ABNORMAL HIGH (ref 70–99)
Potassium: 3.3 mmol/L — ABNORMAL LOW (ref 3.5–5.1)
Sodium: 138 mmol/L (ref 135–145)
Total Bilirubin: 0.6 mg/dL (ref <1.2)
Total Protein: 5.5 g/dL — ABNORMAL LOW (ref 6.5–8.1)

## 2023-01-12 MED ORDER — LEUCOVORIN CALCIUM INJECTION 100 MG
15.0000 mg | Freq: Four times a day (QID) | INTRAMUSCULAR | Status: AC
Start: 2023-01-12 — End: 2023-01-13
  Administered 2023-01-12 – 2023-01-13 (×4): 16 mg via INTRAVENOUS
  Filled 2023-01-12 (×7): qty 0.8

## 2023-01-12 NOTE — Progress Notes (Addendum)
So far, everything is going okay for Ariel [name].  She really has very little toxicity from the methotrexate.  She does not have much of an appetite.  Hopefully, that will improve today.  She has no mucositis in the mouth.  She is using her mouthwashes diligently.  She has had no diarrhea.  She is out of bed.  Her labs show sodium 138.  Potassium 3.3.  BUN 23 creatinine 1.24.  Her SGPT is 130 SGOT 96.  I am not surprised by this.  Her white cell count is 4.9.  Hemoglobin 10.1.  Platelet count 155,000.  She is not complaining pain although her back bothers her from lying in bed.   Her vital signs are temperature 97.8.  Pulse 81.  Blood pressure 110/50.  Head and neck exam shows no ocular or oral lesions.  There are no palpable cervical or supraclavicular lymph nodes.  Lungs are clear bilaterally.  Cardiac exam regular rate and rhythm.  Abdomen is soft.  Bowel sounds are present.  There is no fluid wave.  There is no palpable liver or spleen tip.  Extremity shows no clubbing, cyanosis or edema.  Neurological exam shows no focal neurological deficits.  We will have to see what the methotrexate level is.  Yesterday, the methotrexate level was 0.9.  We have to get down to 0.05 for her to be able to go home in my opinion.  I do appreciate the great care that she is getting from everybody on 6 E.   Christin Bach, MD Philippians 4:19

## 2023-01-12 NOTE — TOC Initial Note (Addendum)
Transition of Care [facility]) - Initial/Assessment Note    Patient Details  Name: Ariel [name] MRN: [id number] Date of Birth: 1955-08-09  [facility] CM/SW Contact:    Adrian Prows, RN Phone Number: 01/12/2023, 12:32 PM  Clinical Narrative:                 TOC from d/c planning; pt says she lives at [facility] [place]; she plans to return at d/c; pt confirmed PCP/insurance; she identified POC son Italy Yuhas [phone]); she has transportaion; pt denies SDOH risks; pt says she has a walker, and cane; contacted Kirsten at facility; [place]; she says pt can return at d/c; signing off; no TOC needs; please place consult if needed. Expected Discharge Plan: Home/Self Care     Patient Goals and CMS Choice Patient states their goals for this hospitalization and ongoing recovery are:: return to [facility]          Expected Discharge Plan and Services   Discharge Planning Services: CM Consult   Living arrangements for the past 2 months: Independent Living Facility                                      Prior Living Arrangements/Services Living arrangements for the past 2 months: Independent Living Facility Lives with:: Self Patient language and need for interpreter reviewed:: Yes Do you feel safe going back to the place where you live?: Yes      Need for Family Participation in Patient Care: Yes (Comment) Care giver support system in place?: Yes (comment) Current home services: DME (cane, walker) Criminal Activity/Legal Involvement Pertinent to Current Situation/Hospitalization: No - Comment as needed  Activities of Daily Living   ADL Screening (condition at time of admission) Independently performs ADLs?: Yes (appropriate for developmental age) Is the patient deaf or have difficulty hearing?: No Does the patient have difficulty seeing, even when wearing glasses/contacts?: No Does the patient have difficulty concentrating,  remembering, or making decisions?: No  Permission Sought/Granted Permission sought to share information with : Case Manager Permission granted to share information with : Yes, Verbal Permission Granted  Share Information with NAME: Case Manager     Permission granted to share info w Relationship: Italy Chalmers (son) [phone]     Emotional Assessment Appearance:: Appears stated age Attitude/Demeanor/Rapport: Gracious Affect (typically observed): Accepting Orientation: : Oriented to Self, Oriented to Place, Oriented to  Time, Oriented to Situation Alcohol / Substance Use: Not Applicable Psych Involvement: No (comment)  Admission diagnosis:  Diffuse large B cell lymphoma (HCC) [C83.30] Patient Active Problem List   Diagnosis Date Noted   Lymphoma malignant, large cell (HCC) 11/27/2022   UTI (urinary tract infection) 10/30/2022   Ataxia 10/29/2022   Leukocytosis 04/14/2022   Thrombocytosis, unspecified 04/14/2022   Epistaxis 04/08/2022   COVID-19 virus infection 04/05/2022   Hypokalemia 04/05/2022   Hypophosphatemia 04/05/2022   SIADH (syndrome of inappropriate ADH production) (HCC) 04/05/2022   Protein-calorie malnutrition, moderate (HCC) 03/28/2022   Palliative care by specialist 03/23/2022   Aphasia 03/17/2022   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   Primary localized osteoarthritis of right hip 01/10/2022   Primary localized osteoarthritis of left hip 10/11/2021   Preoperative clearance 09/13/2021   Encounter for screening mammogram for malignant neoplasm of breast 09/13/2021   Pancytopenia (HCC) 05/16/2021   Lymphoma malignant, 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   Diffuse large B cell lymphoma (HCC)  03/07/2021   High grade B-cell lymphoma (HCC) 12/22/2020   Goals of care, counseling/discussion 12/22/2020   Malignant neoplasm metastatic to brain (HCC) 12/09/2020   Brain tumor (HCC) 12/08/2020   Night sweat 11/30/2020   Complication of anesthesia 11/22/2020   PONV (postoperative  nausea and vomiting) 11/22/2020   Hyperglycemia 11/10/2020   TIA (transient ischemic attack) 10/07/2020   Iron deficiency anemia 10/07/2020   Melanoma (HCC) 2021   Multinodular goiter 12/22/2013   Subclinical hyperthyroidism 12/19/2013   Urinary incontinence 11/19/2013   Hyperlipidemia 11/19/2012   Routine general medical examination at a health care facility 11/18/2012   PCP:  Sandford Craze, NP Pharmacy:   [facility] [place], [place] - [address] [place] [address] Phone: [phone] Fax: [phone]     Social Determinants of Health (SDOH) Social History: SDOH Screenings   Food Insecurity: No Food Insecurity (01/12/2023)  Housing: Low Risk  (01/12/2023)  Transportation Needs: No Transportation Needs (01/12/2023)  Utilities: Not At Risk (01/12/2023)  Alcohol Screen: Low Risk  (09/05/2022)  Depression (PHQ2-9): Low Risk  (09/06/2022)  Financial Resource Strain: Low Risk  (09/05/2022)  Physical Activity: Sufficiently Active (09/05/2022)  Social Connections: Moderately Isolated (09/05/2022)  Stress: No Stress Concern Present (09/05/2022)  Tobacco Use: Low Risk  (01/08/2023)   SDOH Interventions: Food Insecurity Interventions: Intervention Not Indicated, Inpatient TOC Housing Interventions: Intervention Not Indicated, Inpatient TOC Transportation Interventions: Intervention Not Indicated, Inpatient TOC Utilities Interventions: Intervention Not Indicated, Inpatient TOC   Readmission Risk Interventions    01/12/2023   12:30 PM 10/30/2022    1:29 PM 04/14/2022   10:59 AM  Readmission Risk Prevention Plan  Transportation Screening Complete Complete Complete  PCP or Specialist Appt within 5-7 Days  Complete   PCP or Specialist Appt within 3-5 Days Complete    Home Care Screening  Complete   Medication Review (RN CM)  Complete   HRI or Home Care Consult Complete    Social Work Consult for Recovery Care  Planning/Counseling Complete    Palliative Care Screening Not Applicable    Medication Review Oceanographer) Complete  Complete  PCP or Specialist appointment within 3-5 days of discharge   Complete  HRI or Home Care Consult   Complete

## 2023-01-12 NOTE — Progress Notes (Signed)
Mobility Specialist - Progress Note   01/12/23 1315  Mobility  Activity Ambulated independently in hallway;Ambulated independently to bathroom  Level of Assistance Independent  Assistive Device None  Distance Ambulated (ft) 600 ft  Activity Response Tolerated well  Mobility Referral Yes  $Mobility charge 1 Mobility  Mobility Specialist Start Time (ACUTE ONLY) 0100  Mobility Specialist Stop Time (ACUTE ONLY) 0114  Mobility Specialist Time Calculation (min) (ACUTE ONLY) 14 min   Pt received in bed and agreeable to mobility. No complaints during session. Pt to bed after session with all needs met.    [facility]

## 2023-01-13 LAB — COMPREHENSIVE METABOLIC PANEL
ALT: 146 U/L — ABNORMAL HIGH (ref 0–44)
AST: 83 U/L — ABNORMAL HIGH (ref 15–41)
Albumin: 3.2 g/dL — ABNORMAL LOW (ref 3.5–5.0)
Alkaline Phosphatase: 43 U/L (ref 38–126)
Anion gap: 9 (ref 5–15)
BUN: 20 mg/dL (ref 8–23)
CO2: 34 mmol/L — ABNORMAL HIGH (ref 22–32)
Calcium: 8.9 mg/dL (ref 8.9–10.3)
Chloride: 93 mmol/L — ABNORMAL LOW (ref 98–111)
Creatinine, Ser: 1.2 mg/dL — ABNORMAL HIGH (ref 0.44–1.00)
GFR, Estimated: 49 mL/min — ABNORMAL LOW (ref >60)
Glucose, Bld: 167 mg/dL — ABNORMAL HIGH (ref 70–99)
Potassium: 3.3 mmol/L — ABNORMAL LOW (ref 3.5–5.1)
Sodium: 136 mmol/L (ref 135–145)
Total Bilirubin: 0.3 mg/dL (ref <1.2)
Total Protein: 5.4 g/dL — ABNORMAL LOW (ref 6.5–8.1)

## 2023-01-13 LAB — CBC WITH DIFFERENTIAL/PLATELET
Abs Immature Granulocytes: 0.02 10*3/uL (ref 0.00–0.07)
Basophils Absolute: 0 10*3/uL (ref 0.0–0.1)
Basophils Relative: 0 %
Eosinophils Absolute: 0.2 10*3/uL (ref 0.0–0.5)
Eosinophils Relative: 4 %
HCT: 29.7 % — ABNORMAL LOW (ref 36.0–46.0)
Hemoglobin: 9.9 g/dL — ABNORMAL LOW (ref 12.0–15.0)
Immature Granulocytes: 1 %
Lymphocytes Relative: 13 %
Lymphs Abs: 0.6 10*3/uL — ABNORMAL LOW (ref 0.7–4.0)
MCH: 33.6 pg (ref 26.0–34.0)
MCHC: 33.3 g/dL (ref 30.0–36.0)
MCV: 100.7 fL — ABNORMAL HIGH (ref 80.0–100.0)
Monocytes Absolute: 0.1 10*3/uL (ref 0.1–1.0)
Monocytes Relative: 2 %
Neutro Abs: 3.4 10*3/uL (ref 1.7–7.7)
Neutrophils Relative %: 80 %
Platelets: 174 10*3/uL (ref 150–400)
RBC: 2.95 MIL/uL — ABNORMAL LOW (ref 3.87–5.11)
RDW: 13.2 % (ref 11.5–15.5)
WBC: 4.3 10*3/uL (ref 4.0–10.5)
nRBC: 0 % (ref 0.0–0.2)

## 2023-01-13 LAB — URINALYSIS, DIPSTICK ONLY
Bilirubin Urine: NEGATIVE
Glucose, UA: NEGATIVE mg/dL
Hgb urine dipstick: NEGATIVE
Ketones, ur: NEGATIVE mg/dL
Leukocytes,Ua: NEGATIVE
Nitrite: NEGATIVE
Protein, ur: NEGATIVE mg/dL
Specific Gravity, Urine: 1.006 (ref 1.005–1.030)
pH: 9 — ABNORMAL HIGH (ref 5.0–8.0)

## 2023-01-13 MED ORDER — FAMCICLOVIR 500 MG PO TABS
500.0000 mg | ORAL_TABLET | Freq: Every day | ORAL | Status: DC
  Administered 2023-01-13: 500 mg via ORAL
  Filled 2023-01-13: qty 1

## 2023-01-13 MED ORDER — POTASSIUM CHLORIDE 10 MEQ/50ML IV SOLN
10.0000 meq | INTRAVENOUS | Status: AC
  Administered 2023-01-13 (×4): 10 meq via INTRAVENOUS
  Filled 2023-01-13 (×3): qty 50

## 2023-01-13 MED ORDER — FLUCONAZOLE 100 MG PO TABS
100.0000 mg | ORAL_TABLET | Freq: Every day | ORAL | Status: DC
  Administered 2023-01-13: 100 mg via ORAL
  Filled 2023-01-13: qty 1

## 2023-01-13 MED ORDER — CIPROFLOXACIN HCL 500 MG PO TABS
500.0000 mg | ORAL_TABLET | Freq: Every day | ORAL | Status: DC
  Administered 2023-01-13: 500 mg via ORAL
  Filled 2023-01-13: qty 1

## 2023-01-13 MED ORDER — HEPARIN SOD (PORK) LOCK FLUSH 100 UNIT/ML IV SOLN
500.0000 [IU] | Freq: Once | INTRAVENOUS | Status: AC
  Administered 2023-01-13: 500 [IU] via INTRAVENOUS
  Filled 2023-01-13: qty 5

## 2023-01-13 NOTE — Discharge Summary (Signed)
So far, Ms. Barwick is done nicely.  Her methotrexate level has been coming down.  We do not have the level yet today.  Hopefully, we might be able to discharge her today.  She feels okay.  There is no nausea or vomiting.  She is eating a little bit better.  She has had no headache.  There is been no mouth sores.  She has been using her mouth rinses.  She has not had diarrhea..  She is out of bed.  She has had no rashes.  There is been no bleeding.  Her labs show sodium 136.  Potassium 3.3.  BUN 20 creatinine 1.2.  Albumin is 3.2.  SGPT 146 SGOT 83.  Her white cell count is 4.3.  Hemoglobin 9.9.  Platelet count 174,000.  I will go ahead and get her started on prophylactic antibiotic, antiviral, antifungal.  On her vital signs, her temperature is 98.3.  Pulse 92.  Blood pressure 111/50.  Her head neck exam shows no ocular or oral lesions.  There are no palpable cervical or supraclavicular lymph nodes.  She has no mucositis.  Lungs are clear bilaterally.  She has good air movement bilaterally.  Cardiac exam regular rate and rhythm.  Abdomen is soft.  Bowel sounds are present.  There is no guarding or rebound tenderness.  Extremity shows no clubbing, cyanosis or edema.  Neurological exam is nonfocal.  Again, we have see what the methotrexate level is.  This will dictate whether or not we can let her go home today.  I would think that she cannot go to the, she should be able to go home tomorrow.  Will give her some extra potassium today.  I know she has gotten incredible care from everybody up on 6 E.   Christin Bach, MD  Deuteronomy 31:6

## 2023-01-13 NOTE — Progress Notes (Signed)
So far, Ms. Barwick is done nicely.  Her methotrexate level has been coming down.  We do not have the level yet today.  Hopefully, we might be able to discharge her today.  She feels okay.  There is no nausea or vomiting.  She is eating a little bit better.  She has had no headache.  There is been no mouth sores.  She has been using her mouth rinses.  She has not had diarrhea..  She is out of bed.  She has had no rashes.  There is been no bleeding.  Her labs show sodium 136.  Potassium 3.3.  BUN 20 creatinine 1.2.  Albumin is 3.2.  SGPT 146 SGOT 83.  Her white cell count is 4.3.  Hemoglobin 9.9.  Platelet count 174,000.  I will go ahead and get her started on prophylactic antibiotic, antiviral, antifungal.  On her vital signs, her temperature is 98.3.  Pulse 92.  Blood pressure 111/50.  Her head neck exam shows no ocular or oral lesions.  There are no palpable cervical or supraclavicular lymph nodes.  She has no mucositis.  Lungs are clear bilaterally.  She has good air movement bilaterally.  Cardiac exam regular rate and rhythm.  Abdomen is soft.  Bowel sounds are present.  There is no guarding or rebound tenderness.  Extremity shows no clubbing, cyanosis or edema.  Neurological exam is nonfocal.  Again, we have see what the methotrexate level is.  This will dictate whether or not we can let her go home today.  I would think that she cannot go to the, she should be able to go home tomorrow.  Will give her some extra potassium today.  I know she has gotten incredible care from everybody up on 6 E.   Christin Bach, MD  Deuteronomy 31:6

## 2023-01-14 ENCOUNTER — Other Ambulatory Visit: Payer: Self-pay

## 2023-01-16 ENCOUNTER — Other Ambulatory Visit: Payer: Self-pay

## 2023-01-16 ENCOUNTER — Encounter (HOSPITAL_COMMUNITY): Payer: Self-pay | Admitting: Hematology & Oncology

## 2023-01-16 LAB — METHOTREXATE

## 2023-01-17 DIAGNOSIS — M1711 Unilateral primary osteoarthritis, right knee: Secondary | ICD-10-CM | POA: Diagnosis not present

## 2023-01-18 ENCOUNTER — Inpatient Hospital Stay: Payer: Medicare Other

## 2023-01-18 ENCOUNTER — Other Ambulatory Visit: Payer: Self-pay | Admitting: *Deleted

## 2023-01-18 ENCOUNTER — Encounter: Payer: Self-pay | Admitting: *Deleted

## 2023-01-18 DIAGNOSIS — D509 Iron deficiency anemia, unspecified: Secondary | ICD-10-CM | POA: Diagnosis not present

## 2023-01-18 DIAGNOSIS — Z9221 Personal history of antineoplastic chemotherapy: Secondary | ICD-10-CM | POA: Diagnosis not present

## 2023-01-18 DIAGNOSIS — C8330 Diffuse large B-cell lymphoma, unspecified site: Secondary | ICD-10-CM

## 2023-01-18 DIAGNOSIS — C8510 Unspecified B-cell lymphoma, unspecified site: Secondary | ICD-10-CM

## 2023-01-18 DIAGNOSIS — C8331 Diffuse large B-cell lymphoma, lymph nodes of head, face, and neck: Secondary | ICD-10-CM

## 2023-01-18 DIAGNOSIS — D61818 Other pancytopenia: Secondary | ICD-10-CM

## 2023-01-18 DIAGNOSIS — C83398 Diffuse large b-cell lymphoma of other extranodal and solid organ sites: Secondary | ICD-10-CM | POA: Diagnosis not present

## 2023-01-18 LAB — CBC WITH DIFFERENTIAL (CANCER CENTER ONLY)
Abs Immature Granulocytes: 0.04 10*3/uL (ref 0.00–0.07)
Basophils Absolute: 0 10*3/uL (ref 0.0–0.1)
Basophils Relative: 0 %
Eosinophils Absolute: 0.2 10*3/uL (ref 0.0–0.5)
Eosinophils Relative: 4 %
HCT: 34.6 % — ABNORMAL LOW (ref 36.0–46.0)
Hemoglobin: 11.8 g/dL — ABNORMAL LOW (ref 12.0–15.0)
Immature Granulocytes: 1 %
Lymphocytes Relative: 33 %
Lymphs Abs: 1.7 10*3/uL (ref 0.7–4.0)
MCH: 33.1 pg (ref 26.0–34.0)
MCHC: 34.1 g/dL (ref 30.0–36.0)
MCV: 97.2 fL (ref 80.0–100.0)
Monocytes Absolute: 0.6 10*3/uL (ref 0.1–1.0)
Monocytes Relative: 11 %
Neutro Abs: 2.7 10*3/uL (ref 1.7–7.7)
Neutrophils Relative %: 51 %
Platelet Count: 183 10*3/uL (ref 150–400)
RBC: 3.56 MIL/uL — ABNORMAL LOW (ref 3.87–5.11)
RDW: 13.3 % (ref 11.5–15.5)
WBC Count: 5.2 10*3/uL (ref 4.0–10.5)
nRBC: 0 % (ref 0.0–0.2)

## 2023-01-18 LAB — CMP (CANCER CENTER ONLY)
ALT: 43 U/L (ref 0–44)
AST: 16 U/L (ref 15–41)
Albumin: 4.5 g/dL (ref 3.5–5.0)
Alkaline Phosphatase: 58 U/L (ref 38–126)
Anion gap: 9 (ref 5–15)
BUN: 19 mg/dL (ref 8–23)
CO2: 29 mmol/L (ref 22–32)
Calcium: 10.3 mg/dL (ref 8.9–10.3)
Chloride: 101 mmol/L (ref 98–111)
Creatinine: 0.98 mg/dL (ref 0.44–1.00)
GFR, Estimated: >60 mL/min (ref >60)
Glucose, Bld: 124 mg/dL — ABNORMAL HIGH (ref 70–99)
Potassium: 4.1 mmol/L (ref 3.5–5.1)
Sodium: 139 mmol/L (ref 135–145)
Total Bilirubin: 0.4 mg/dL (ref <1.2)
Total Protein: 6.7 g/dL (ref 6.5–8.1)

## 2023-01-18 LAB — LACTATE DEHYDROGENASE: LDH: 175 U/L (ref 98–192)

## 2023-01-18 MED ORDER — FLUCONAZOLE 100 MG PO TABS: 100.0000 mg | ORAL_TABLET | Freq: Every day | ORAL | 3 refills | Status: DC

## 2023-01-18 MED ORDER — FAMCICLOVIR 500 MG PO TABS: 500.0000 mg | ORAL_TABLET | Freq: Every morning | ORAL | 3 refills | Status: DC

## 2023-01-18 MED ORDER — CIPROFLOXACIN HCL 500 MG PO TABS: 500.0000 mg | ORAL_TABLET | Freq: Every day | ORAL | 3 refills | Status: DC

## 2023-01-19 DIAGNOSIS — M1712 Unilateral primary osteoarthritis, left knee: Secondary | ICD-10-CM | POA: Diagnosis not present

## 2023-01-22 ENCOUNTER — Encounter (HOSPITAL_COMMUNITY): Payer: Self-pay

## 2023-01-22 ENCOUNTER — Encounter (HOSPITAL_COMMUNITY): Payer: Self-pay | Admitting: Hematology & Oncology

## 2023-01-24 DIAGNOSIS — M1711 Unilateral primary osteoarthritis, right knee: Secondary | ICD-10-CM | POA: Diagnosis not present

## 2023-01-24 NOTE — Progress Notes (Signed)
She really does not have any injury to her kidneys from the methotrexate.  Anything that happens is transient.

## 2023-01-29 ENCOUNTER — Inpatient Hospital Stay: Payer: Medicare Other | Attending: Internal Medicine

## 2023-01-29 ENCOUNTER — Inpatient Hospital Stay (HOSPITAL_COMMUNITY): Payer: Medicare Other

## 2023-01-29 ENCOUNTER — Other Ambulatory Visit: Payer: Self-pay

## 2023-01-29 ENCOUNTER — Encounter: Payer: Self-pay | Admitting: Hematology & Oncology

## 2023-01-29 ENCOUNTER — Inpatient Hospital Stay (HOSPITAL_BASED_OUTPATIENT_CLINIC_OR_DEPARTMENT_OTHER): Payer: Medicare Other | Admitting: Hematology & Oncology

## 2023-01-29 ENCOUNTER — Other Ambulatory Visit: Payer: Self-pay | Admitting: Hematology & Oncology

## 2023-01-29 ENCOUNTER — Inpatient Hospital Stay: Payer: Medicare Other

## 2023-01-29 ENCOUNTER — Inpatient Hospital Stay (HOSPITAL_COMMUNITY)
Admission: AD | Admit: 2023-01-29 | Discharge: 2023-02-03 | DRG: 847 | Disposition: A | Discharge status: Home / Self Care | Payer: Medicare Other | Attending: Hematology & Oncology | Admitting: Hematology & Oncology

## 2023-01-29 VITALS — BP 130/59 | HR 103 | Temp 98.2°F | Resp 20 | Ht 66.0 in | Wt 156.0 lb

## 2023-01-29 DIAGNOSIS — Z85820 Personal history of malignant melanoma of skin: Secondary | ICD-10-CM

## 2023-01-29 DIAGNOSIS — Z8249 Family history of ischemic heart disease and other diseases of the circulatory system: Secondary | ICD-10-CM

## 2023-01-29 DIAGNOSIS — C8510 Unspecified B-cell lymphoma, unspecified site: Secondary | ICD-10-CM

## 2023-01-29 DIAGNOSIS — M25561 Pain in right knee: Secondary | ICD-10-CM | POA: Diagnosis not present

## 2023-01-29 DIAGNOSIS — C8330 Diffuse large B-cell lymphoma, unspecified site: Secondary | ICD-10-CM

## 2023-01-29 DIAGNOSIS — Z95820 Peripheral vascular angioplasty status with implants and grafts: Secondary | ICD-10-CM | POA: Diagnosis not present

## 2023-01-29 DIAGNOSIS — C8580 Other specified types of non-Hodgkin lymphoma, unspecified site: Secondary | ICD-10-CM | POA: Diagnosis not present

## 2023-01-29 DIAGNOSIS — M25552 Pain in left hip: Secondary | ICD-10-CM | POA: Diagnosis present

## 2023-01-29 DIAGNOSIS — C83398 Diffuse large b-cell lymphoma of other extranodal and solid organ sites: Secondary | ICD-10-CM | POA: Insufficient documentation

## 2023-01-29 DIAGNOSIS — C7931 Secondary malignant neoplasm of brain: Secondary | ICD-10-CM

## 2023-01-29 DIAGNOSIS — Z8673 Personal history of transient ischemic attack (TIA), and cerebral infarction without residual deficits: Secondary | ICD-10-CM | POA: Diagnosis not present

## 2023-01-29 DIAGNOSIS — M25551 Pain in right hip: Secondary | ICD-10-CM | POA: Diagnosis not present

## 2023-01-29 DIAGNOSIS — Z888 Allergy status to other drugs, medicaments and biological substances status: Secondary | ICD-10-CM

## 2023-01-29 DIAGNOSIS — Z96643 Presence of artificial hip joint, bilateral: Secondary | ICD-10-CM | POA: Diagnosis not present

## 2023-01-29 DIAGNOSIS — Z882 Allergy status to sulfonamides status: Secondary | ICD-10-CM

## 2023-01-29 DIAGNOSIS — Z5111 Encounter for antineoplastic chemotherapy: Principal | ICD-10-CM

## 2023-01-29 DIAGNOSIS — R112 Nausea with vomiting, unspecified: Secondary | ICD-10-CM | POA: Diagnosis not present

## 2023-01-29 DIAGNOSIS — C8599 Non-Hodgkin lymphoma, unspecified, extranodal and solid organ sites: Secondary | ICD-10-CM | POA: Diagnosis not present

## 2023-01-29 DIAGNOSIS — R519 Headache, unspecified: Secondary | ICD-10-CM | POA: Diagnosis not present

## 2023-01-29 DIAGNOSIS — E876 Hypokalemia: Secondary | ICD-10-CM | POA: Diagnosis not present

## 2023-01-29 DIAGNOSIS — Z452 Encounter for adjustment and management of vascular access device: Secondary | ICD-10-CM | POA: Diagnosis not present

## 2023-01-29 DIAGNOSIS — Z0000 Encounter for general adult medical examination without abnormal findings: Secondary | ICD-10-CM | POA: Diagnosis not present

## 2023-01-29 DIAGNOSIS — M25562 Pain in left knee: Secondary | ICD-10-CM | POA: Diagnosis present

## 2023-01-29 DIAGNOSIS — C8332 Diffuse large B-cell lymphoma, intrathoracic lymph nodes: Secondary | ICD-10-CM

## 2023-01-29 DIAGNOSIS — C8331 Diffuse large B-cell lymphoma, lymph nodes of head, face, and neck: Secondary | ICD-10-CM

## 2023-01-29 DIAGNOSIS — T451X5A Adverse effect of antineoplastic and immunosuppressive drugs, initial encounter: Secondary | ICD-10-CM | POA: Diagnosis not present

## 2023-01-29 DIAGNOSIS — D509 Iron deficiency anemia, unspecified: Secondary | ICD-10-CM | POA: Insufficient documentation

## 2023-01-29 DIAGNOSIS — R059 Cough, unspecified: Secondary | ICD-10-CM | POA: Diagnosis not present

## 2023-01-29 DIAGNOSIS — Z885 Allergy status to narcotic agent status: Secondary | ICD-10-CM

## 2023-01-29 DIAGNOSIS — Z8571 Personal history of Hodgkin lymphoma: Secondary | ICD-10-CM | POA: Diagnosis not present

## 2023-01-29 DIAGNOSIS — Z79899 Other long term (current) drug therapy: Secondary | ICD-10-CM

## 2023-01-29 DIAGNOSIS — Z833 Family history of diabetes mellitus: Secondary | ICD-10-CM

## 2023-01-29 DIAGNOSIS — C8590 Non-Hodgkin lymphoma, unspecified, unspecified site: Secondary | ICD-10-CM | POA: Diagnosis not present

## 2023-01-29 DIAGNOSIS — Z825 Family history of asthma and other chronic lower respiratory diseases: Secondary | ICD-10-CM

## 2023-01-29 DIAGNOSIS — R7989 Other specified abnormal findings of blood chemistry: Secondary | ICD-10-CM | POA: Diagnosis not present

## 2023-01-29 DIAGNOSIS — Z9889 Other specified postprocedural states: Secondary | ICD-10-CM

## 2023-01-29 DIAGNOSIS — E7800 Pure hypercholesterolemia, unspecified: Secondary | ICD-10-CM | POA: Diagnosis not present

## 2023-01-29 LAB — CMP (CANCER CENTER ONLY)
ALT: 16 U/L (ref 0–44)
AST: 15 U/L (ref 15–41)
Albumin: 4.5 g/dL (ref 3.5–5.0)
Alkaline Phosphatase: 57 U/L (ref 38–126)
Anion gap: 11 (ref 5–15)
BUN: 16 mg/dL (ref 8–23)
CO2: 27 mmol/L (ref 22–32)
Calcium: 10.4 mg/dL — ABNORMAL HIGH (ref 8.9–10.3)
Chloride: 103 mmol/L (ref 98–111)
Creatinine: 0.81 mg/dL (ref 0.44–1.00)
GFR, Estimated: >60 mL/min (ref >60)
Glucose, Bld: 121 mg/dL — ABNORMAL HIGH (ref 70–99)
Potassium: 3.9 mmol/L (ref 3.5–5.1)
Sodium: 141 mmol/L (ref 135–145)
Total Bilirubin: 0.4 mg/dL (ref <1.2)
Total Protein: 6.8 g/dL (ref 6.5–8.1)

## 2023-01-29 LAB — CBC WITH DIFFERENTIAL (CANCER CENTER ONLY)
Abs Immature Granulocytes: 0.02 10*3/uL (ref 0.00–0.07)
Basophils Absolute: 0 10*3/uL (ref 0.0–0.1)
Basophils Relative: 0 %
Eosinophils Absolute: 0.1 10*3/uL (ref 0.0–0.5)
Eosinophils Relative: 1 %
HCT: 34.6 % — ABNORMAL LOW (ref 36.0–46.0)
Hemoglobin: 12 g/dL (ref 12.0–15.0)
Immature Granulocytes: 0 %
Lymphocytes Relative: 35 %
Lymphs Abs: 1.9 10*3/uL (ref 0.7–4.0)
MCH: 33.2 pg (ref 26.0–34.0)
MCHC: 34.7 g/dL (ref 30.0–36.0)
MCV: 95.8 fL (ref 80.0–100.0)
Monocytes Absolute: 0.5 10*3/uL (ref 0.1–1.0)
Monocytes Relative: 9 %
Neutro Abs: 3 10*3/uL (ref 1.7–7.7)
Neutrophils Relative %: 55 %
Platelet Count: 206 10*3/uL (ref 150–400)
RBC: 3.61 MIL/uL — ABNORMAL LOW (ref 3.87–5.11)
RDW: 12.6 % (ref 11.5–15.5)
WBC Count: 5.5 10*3/uL (ref 4.0–10.5)
nRBC: 0 % (ref 0.0–0.2)

## 2023-01-29 LAB — URINALYSIS, ROUTINE W REFLEX MICROSCOPIC
Bilirubin Urine: NEGATIVE
Glucose, UA: NEGATIVE mg/dL
Hgb urine dipstick: NEGATIVE
Ketones, ur: NEGATIVE mg/dL
Leukocytes,Ua: NEGATIVE
Nitrite: NEGATIVE
Protein, ur: NEGATIVE mg/dL
Specific Gravity, Urine: 1.018 (ref 1.005–1.030)
pH: 6 (ref 5.0–8.0)

## 2023-01-29 LAB — LACTATE DEHYDROGENASE: LDH: 145 U/L (ref 98–192)

## 2023-01-29 MED ORDER — HEPARIN SOD (PORK) LOCK FLUSH 100 UNIT/ML IV SOLN: 500.0000 [IU] | Freq: Once | INTRAVENOUS | Status: DC

## 2023-01-29 MED ORDER — SODIUM CHLORIDE 0.9% FLUSH
10.0000 mL | Freq: Once | INTRAVENOUS | Status: AC
  Administered 2023-01-29: 10 mL via INTRAVENOUS

## 2023-01-29 MED ORDER — SODIUM CHLORIDE 0.9 % IV SOLN: INTRAVENOUS | Status: DC

## 2023-01-29 MED ORDER — ONDANSETRON 4 MG PO TBDP
8.0000 mg | ORAL_TABLET | Freq: Three times a day (TID) | ORAL | Status: DC | PRN
  Administered 2023-01-30 – 2023-02-02 (×3): 8 mg via ORAL
  Filled 2023-01-29 (×3): qty 2

## 2023-01-29 MED ORDER — DIPHENHYDRAMINE HCL 25 MG PO CAPS
25.0000 mg | ORAL_CAPSULE | Freq: Once | ORAL | Status: AC
Start: 2023-01-29 — End: 2023-01-29
  Administered 2023-01-29: 25 mg via ORAL
  Filled 2023-01-29: qty 1

## 2023-01-29 MED ORDER — FAMCICLOVIR 500 MG PO TABS
500.0000 mg | ORAL_TABLET | Freq: Every day | ORAL | Status: DC
  Administered 2023-01-30 – 2023-02-02 (×4): 500 mg via ORAL
  Filled 2023-01-29 (×5): qty 1

## 2023-01-29 MED ORDER — DEXTROSE-SODIUM CHLORIDE 5-0.45 % IV SOLN: INTRAVENOUS | Status: DC

## 2023-01-29 MED ORDER — BIOTENE DRY MOUTH MT LIQD
15.0000 mL | Freq: Four times a day (QID) | OROMUCOSAL | Status: DC
  Administered 2023-01-29 – 2023-01-30 (×5): 15 mL via OROMUCOSAL

## 2023-01-29 MED ORDER — SODIUM BICARBONATE 8.4 % IV SOLN
INTRAVENOUS | Status: DC
  Filled 2023-01-29 (×4): qty 150
  Filled 2023-01-29 (×2): qty 1000
  Filled 2023-01-29: qty 150

## 2023-01-29 MED ORDER — ACETAMINOPHEN 325 MG PO TABS
650.0000 mg | ORAL_TABLET | Freq: Once | ORAL | Status: AC
Start: 2023-01-29 — End: 2023-01-29
  Administered 2023-01-29: 650 mg via ORAL
  Filled 2023-01-29: qty 2

## 2023-01-29 MED ORDER — ENOXAPARIN SODIUM 40 MG/0.4ML IJ SOSY
40.0000 mg | PREFILLED_SYRINGE | INTRAMUSCULAR | Status: DC
  Administered 2023-01-29 – 2023-02-02 (×5): 40 mg via SUBCUTANEOUS
  Filled 2023-01-29 (×5): qty 0.4

## 2023-01-29 MED ORDER — RITUXIMAB CHEMO INJECTION 500 MG/50ML
500.0000 mg/m2 | Freq: Once | INTRAVENOUS | Status: AC
  Administered 2023-01-29: 900 mg via INTRAVENOUS
  Filled 2023-01-29: qty 90

## 2023-01-29 MED ORDER — SODIUM BICARBONATE/SODIUM CHLORIDE MOUTHWASH
15.0000 | OROMUCOSAL | Status: DC
  Administered 2023-01-29 – 2023-02-03 (×24): 15 via OROMUCOSAL
  Filled 2023-01-29: qty 1000

## 2023-01-29 NOTE — H&P (Signed)
Ariel [name] is an 67 y.o. female.    Chief Complaint: Admission for chemotherapy for recurrent lymphoma of the brain  HPI: Ariel [name] is well-known to me.  She is a very nice 67 year old white female.  She has a history of recurrent large cell lymphoma of the brain.  She has had prior chemotherapy with Rituxan/high-dose methotrexate.  She has responded very well.  Her last MRI of the brain did not show any obvious eye disease.  She typically clears out the methotrexate very easily.  She is now admitted for her third cycle of chemotherapy.  She has had no headache.  There is been no mouth sores.  She has had no fever.  She has had no cough or shortness of breath.  There is been no change in bowel or bladder habits.  Patient has some hip pain.  She has had bilateral hip surgery in the past.  She also has some knee pain.  She gets some injections into the knees.  She has had no weakness.  She has had no visual changes.  There has been no problems with her appetite.  She had a wonderful Thanksgiving.  Currently, I would say her performance status is ECOG 1.  ROS: Review of Systems  Constitutional: Negative.   HENT: Negative.    Eyes: Negative.   Respiratory: Negative.    Cardiovascular: Negative.   Gastrointestinal: Negative.   Genitourinary: Negative.   Musculoskeletal: Negative.   Skin: Negative.   Neurological: Negative.   Endo/Heme/Allergies: Negative.   Psychiatric/Behavioral: Negative.       Past Medical History:  Diagnosis Date   Allergy    Arthritis    Cataract    Chicken pox    Complication of anesthesia    Per patient, very slow to wake up after anesthesia   Goals of care, counseling/discussion 12/22/2020   High cholesterol    High grade B-cell lymphoma (HCC) 12/22/2020   Hyperglycemia 11/10/2020   Melanoma (HCC) 2021   right hip   Melanoma (HCC) 10/28/2020   pt stated brain liver and bladder   PONV (postoperative nausea and vomiting)    Stroke [facility])     Urinary incontinence     Past Surgical History:  Procedure Laterality Date   AIR/FLUID EXCHANGE Right 12/07/2020   Procedure: AIR/FLUID EXCHANGE;  Surgeon: Carmela Rima, MD;  Location: [facility] OR;  Service: Ophthalmology;  Laterality: Right;   APPENDECTOMY  1962   BIOPSY  12/14/2020   Procedure: BIOPSY;  Surgeon: Meridee Score Netty Starring., MD;  Location: [facility] ENDOSCOPY;  Service: Gastroenterology;;   ESOPHAGOGASTRODUODENOSCOPY (EGD) WITH PROPOFOL N/A 12/14/2020   Procedure: ESOPHAGOGASTRODUODENOSCOPY (EGD) WITH PROPOFOL;  Surgeon: Lemar Lofty., MD;  Location: [facility] ENDOSCOPY;  Service: Gastroenterology;  Laterality: N/A;   EXCISION MELANOMA WITH SENTINEL LYMPH NODE BIOPSY Right 10/09/2019   Procedure: WIDE LOCAL EXCISION WITH ADVANCEMENT FLAP CLOSURE RIGHT HIP MELANOMA WITH SENTINEL LYMPH NODE BIOPSY;  Surgeon: Almond Lint, MD;  Location: [facility] OR;  Service: General;  Laterality: Right;   EYE SURGERY     FINE NEEDLE ASPIRATION  12/14/2020   Procedure: FINE NEEDLE ASPIRATION (FNA) LINEAR;  Surgeon: Lemar Lofty., MD;  Location: [facility] ENDOSCOPY;  Service: Gastroenterology;;   HERNIA REPAIR  2009   IR BONE MARROW BIOPSY & ASPIRATION  05/08/2022   IR IMAGING GUIDED PORT INSERTION  12/30/2020   JOINT REPLACEMENT  2023   MOUTH SURGERY  11/2015   gum surgery and bone implant   PARS PLANA VITRECTOMY Right 12/07/2020  Procedure: PARS PLANA VITRECTOMY WITH 25 GAUGE;  Surgeon: Carmela Rima, MD;  Location: [facility] OR;  Service: Ophthalmology;  Laterality: Right;   PHOTOCOAGULATION WITH LASER Right 12/07/2020   Procedure: PHOTOCOAGULATION WITH ENDOLASER PANRITINAL COAGULATION;  Surgeon: Carmela Rima, MD;  Location: [facility] OR;  Service: Ophthalmology;  Laterality: Right;   TOTAL HIP ARTHROPLASTY Left 10/11/2021   Procedure: LEFT TOTAL HIP ARTHROPLASTY ANTERIOR APPROACH;  Surgeon: Marcene Corning, MD;  Location: [facility] ORS;  Service: Orthopedics;  Laterality: Left;   TOTAL HIP ARTHROPLASTY Right  01/10/2022   Procedure: RIGHT TOTAL HIP ARTHROPLASTY ANTERIOR APPROACH;  Surgeon: Marcene Corning, MD;  Location: [facility] ORS;  Service: Orthopedics;  Laterality: Right;   UPPER ESOPHAGEAL ENDOSCOPIC ULTRASOUND (EUS) N/A 12/14/2020   Procedure: UPPER ESOPHAGEAL ENDOSCOPIC ULTRASOUND (EUS);  Surgeon: Lemar Lofty., MD;  Location: [facility] ENDOSCOPY;  Service: Gastroenterology;  Laterality: N/A;   WISDOM TOOTH EXTRACTION      Family History  Problem Relation Age of Onset   Coronary artery disease Father        died at 86   COPD Father    Alcohol abuse Father    Diabetes Mellitus II Father    Coronary artery disease Sister    Cancer Sister        breast   Heart attack Brother    Hyperlipidemia Other        died 38- "natural causes"   Hyperlipidemia Other        4 siblings    Hypertension Other        3 siblings    Social History:  reports that she has never smoked. She has never used smokeless tobacco. She reports that she does not drink alcohol and does not use drugs.  Allergies:  Allergies  Allergen Reactions   Decadron [Dexamethasone] Other (See Comments)    Leg weakness Hallucinations   Plavix [Clopidogrel] Hives, Swelling and Other (See Comments)    Lips became swollen   Prednisone Other (See Comments)    Steroids caused psychological issues  Hallucinations   Sulfa Antibiotics Itching and Rash   Codeine Nausea And Vomiting    Medications Prior to Admission  Medication Sig Dispense Refill   acetaminophen (TYLENOL) 325 MG tablet Take 650 mg by mouth every 6 (six) hours as needed for headache, fever, moderate pain (pain score 4-6) or mild pain (pain score 1-3).     antiseptic oral rinse (BIOTENE) LIQD 15 mLs by Mouth Rinse route every 6 (six) hours. (Patient taking differently: 15 mLs by Mouth Rinse route every 6 (six) hours as needed for dry mouth.) 500 mL 4   famciclovir (FAMVIR) 500 MG tablet Take 500 mg by mouth daily.     ondansetron (ZOFRAN-ODT) 8 MG disintegrating  tablet Take 1 tablet (8 mg total) by mouth every 8 (eight) hours as needed for nausea or vomiting. (Patient taking differently: Take 8 mg by mouth every 8 (eight) hours as needed for nausea or vomiting (dissolve orally).) 30 tablet 1   fluconazole (DIFLUCAN) 100 MG tablet Take 100 mg by mouth daily.     Sodium Chloride-Sodium Bicarb (SODIUM BICARBONATE/SODIUM CHLORIDE) SOLN 15 Applications by Mouth Rinse route every 4 (four) hours. (Patient not taking: Reported on 01/29/2023) 1000 mL 3    Results for orders placed or performed during the hospital encounter of 01/29/23 (from the past 48 hour(s))  Urinalysis, Routine w reflex microscopic -Urine, Clean Catch     Status: None   Collection Time: 01/29/23 11:06 AM  Result Value Ref  Range   Color, Urine YELLOW YELLOW   APPearance CLEAR CLEAR   Specific Gravity, Urine 1.018 1.005 - 1.030   pH 6.0 5.0 - 8.0   Glucose, UA NEGATIVE NEGATIVE mg/dL   Hgb urine dipstick NEGATIVE NEGATIVE   Bilirubin Urine NEGATIVE NEGATIVE   Ketones, ur NEGATIVE NEGATIVE mg/dL   Protein, ur NEGATIVE NEGATIVE mg/dL   Nitrite NEGATIVE NEGATIVE   Leukocytes,Ua NEGATIVE NEGATIVE    Comment: Performed at [facility], [address] [place], [place] [address]    No results found.   BP (!) 115/57 (BP Location: Right Arm)   Pulse 80   Temp 98.4 F (36.9 C) (Oral)   Resp 18   SpO2 98%   Physical Examination: Physical Exam Vitals reviewed.  HENT:     Head: Normocephalic and atraumatic.  Eyes:     Pupils: Pupils are equal, round, and reactive to light.  Cardiovascular:     Rate and Rhythm: Normal rate and regular rhythm.     Heart sounds: Normal heart sounds.  Pulmonary:     Effort: Pulmonary effort is normal.     Breath sounds: Normal breath sounds.  Abdominal:     General: Bowel sounds are normal.     Palpations: Abdomen is soft.  Musculoskeletal:        General: No tenderness or deformity. Normal range of motion.     Cervical back:  Normal range of motion.  Lymphadenopathy:     Cervical: No cervical adenopathy.  Skin:    General: Skin is warm and dry.     Findings: No erythema or rash.  Neurological:     Mental Status: She is alert and oriented to person, place, and time.  Psychiatric:        Behavior: Behavior normal.        Thought Content: Thought content normal.        Judgment: Judgment normal.      Assessment/Plan Ms. Vaynshteyn is a very nice 67 year old white female.  She is being admitted for her third cycle of Rituxan high-dose methotrexate.  Her Port-A-Cath was accessed in the office.  Her labs look okay.  As always, we get her urine alkalized.  We want her pH of the urine above 8.  She will get a Rituxan on the first day.  After that, she will then have the high-dose methotrexate.  She will then receive leucovorin rescue.  Our Pharmacy really helps his out with the leucovorin and the dosing for the methotrexate.  I am very grateful for all of their help.  We will follow her labs.  We will keep a monitor on the urine pH daily.  Hopefully, she will be able to go home after about 4 or 5 days.  Again, she typically goes home on Friday or Saturday once the methotrexate level gets below the criteria.  I know that she we will get incredible care from the staff up on 6 E.  They always do a wonderful job and she really appreciates their compassion that the staff have.  [name], MD 01/29/2023, 2:46 PM

## 2023-01-29 NOTE — Progress Notes (Signed)
Hematology and Oncology Follow Up Visit  Ariel [name] [id number] Oct 07, 1955 67 y.o. 01/29/2023   Principle Diagnosis:  Diffuse large cell non-Hodgkin's lymphoma-CNS involvement -- relapsed   Past Therapy: Status post chemotherapy with R-ICE/R-MTV --  s/p cycle #4 MATRIX -- s/p cycle #4 -- start on 05/02/2021 --completed on 08/06/2021 Neulasta 6 mg subcu post chemotherapy MTV- s/p cycle #1 -- given on 01/22/204   Current Therapy:        Imbruvica/Rituxan -- started 05/02/2022, s/p cycle #5 -DC on 07/16/2022 --DC on 10/29/2022 High-dose methotrexate/Rituxan-cycle #2 given on 11/03/2022   Interim History:  Ariel [name] is here today for follow-up.  She is ready for third cycle of treatment.  She had a wonderful Thanksgiving.  She feels great.  She has had no problems with headache.  There is been no pain.  She has had no issues with cough or shortness of breath.  There has been no nausea or vomiting.  She has had no change in bowel or bladder habits.  She has had no bleeding.  There is been no leg swelling.  She has had no rashes.  So far, she is tolerated the first 2 cycles of treatment incredibly well.  Overall, I would say performance status is ECOG 0.     Medications:  Allergies as of 01/29/2023       Reactions   Decadron [dexamethasone] Other (See Comments)   Leg weakness Hallucinations   Plavix [clopidogrel] Hives, Swelling, Other (See Comments)   Lips became swollen   Prednisone Other (See Comments)   Steroids caused psychological issues  Hallucinations   Sulfa Antibiotics Itching, Rash   Codeine Nausea And Vomiting        Medication List        Accurate as of January 29, 2023  8:05 AM. If you have any questions, ask your nurse or doctor.          STOP taking these medications    ciprofloxacin 500 MG tablet Commonly known as: Cipro Stopped by: [name]   famciclovir 500 MG tablet Commonly known as: FAMVIR Stopped by: [name]   fluconazole 100  MG tablet Commonly known as: DIFLUCAN Stopped by: [name]   gabapentin 300 MG capsule Commonly known as: NEURONTIN Stopped by: [name]       TAKE these medications    acetaminophen 325 MG tablet Commonly known as: TYLENOL Take 650 mg by mouth every 6 (six) hours as needed for headache, fever, moderate pain (pain score 4-6) or mild pain (pain score 1-3).   antiseptic oral rinse Liqd 15 mLs by Mouth Rinse route every 6 (six) hours. What changed:  when to take this reasons to take this   ondansetron 8 MG disintegrating tablet Commonly known as: ZOFRAN-ODT Take 1 tablet (8 mg total) by mouth every 8 (eight) hours as needed for nausea or vomiting. What changed: reasons to take this   sodium bicarbonate/sodium chloride Soln 15 Applications by Mouth Rinse route every 4 (four) hours. What changed: how much to take        Allergies:  Allergies  Allergen Reactions   Decadron [Dexamethasone] Other (See Comments)    Leg weakness Hallucinations   Plavix [Clopidogrel] Hives, Swelling and Other (See Comments)    Lips became swollen   Prednisone Other (See Comments)    Steroids caused psychological issues  Hallucinations   Sulfa Antibiotics Itching and Rash   Codeine Nausea And Vomiting    Past Medical History,  Surgical history, Social history, and Family History were reviewed and updated.  Review of Systems: Review of Systems  Constitutional: Negative.   HENT: Negative.    Eyes: Negative.   Respiratory: Negative.    Cardiovascular: Negative.   Gastrointestinal: Negative.   Genitourinary: Negative.   Musculoskeletal: Negative.   Skin: Negative.   Neurological: Negative.   Endo/Heme/Allergies: Negative.   Psychiatric/Behavioral: Negative.       Physical Exam:  Vital signs are temperature 98.2.  Pulse 103.  Blood pressure 130/59.  Weight is 156 pounds.    Wt Readings from Last 3 Encounters:  01/29/23 156 lb (70.8 kg)  01/08/23 158 lb (71.7 kg)   01/05/23 158 lb (71.7 kg)    Physical Activity: Sufficiently Active (09/05/2022)   Exercise Vital Sign    Days of Exercise per Week: 6 days    Minutes of Exercise per Session: 40 min   Physical Exam Vitals reviewed.  HENT:     Head: Normocephalic and atraumatic.  Eyes:     Pupils: Pupils are equal, round, and reactive to light.  Cardiovascular:     Rate and Rhythm: Normal rate and regular rhythm.     Heart sounds: Normal heart sounds.  Pulmonary:     Effort: Pulmonary effort is normal.     Breath sounds: Normal breath sounds.  Abdominal:     General: Bowel sounds are normal.     Palpations: Abdomen is soft.  Musculoskeletal:        General: No tenderness or deformity. Normal range of motion.     Cervical back: Normal range of motion.  Lymphadenopathy:     Cervical: No cervical adenopathy.  Skin:    General: Skin is warm and dry.     Findings: No erythema or rash.  Neurological:     Mental Status: She is alert and oriented to person, place, and time.  Psychiatric:        Behavior: Behavior normal.        Thought Content: Thought content normal.        Judgment: Judgment normal.     Lab Results  Component Value Date   WBC 5.2 01/18/2023   HGB 11.8 (L) 01/18/2023   HCT 34.6 (L) 01/18/2023   MCV 97.2 01/18/2023   PLT 183 01/18/2023   Lab Results  Component Value Date   FERRITIN 944 (H) 10/16/2022   IRON 61 10/16/2022   TIBC 277 10/16/2022   UIBC 216 10/16/2022   IRONPCTSAT 22 10/16/2022   Lab Results  Component Value Date   RETICCTPCT 2.4 11/15/2021   RBC 3.56 (L) 01/18/2023   No results found for: "KPAFRELGTCHN", "LAMBDASER", "KAPLAMBRATIO" No results found for: "IGGSERUM", "IGA", "IGMSERUM" No results found for: "TOTALPROTELP", "ALBUMINELP", "A1GS", "A2GS", "BETS", "BETA2SER", "GAMS", "MSPIKE", "SPEI"   Chemistry      Component Value Date/Time   NA 139 01/18/2023 1230   K 4.1 01/18/2023 1230   CL 101 01/18/2023 1230   CO2 29 01/18/2023 1230   BUN  19 01/18/2023 1230   CREATININE 0.98 01/18/2023 1230   CREATININE 0.68 11/18/2012 1439      Component Value Date/Time   CALCIUM 10.3 01/18/2023 1230   ALKPHOS 58 01/18/2023 1230   AST 16 01/18/2023 1230   ALT 43 01/18/2023 1230   BILITOT 0.4 01/18/2023 1230       Impression and Plan: Ariel [name] is a very pleasant 67 yo caucasian female with iron deficiency anemia and then was diagnosed with diffuse large cell non-Hodgkin's  lymphoma that had traveled to her brain.  She  has had another relapse.  She had been on Rituxan/IMBRUVICA.  We went and stopped this.  We now have her on Rituxan/high-dose of methotrexate.  She is responding by the MRI.  I did talk to her at length about CAR-T therapy.  She still does not want to consider this.  I am not sure as to what the reservation is with this.  We will going get her admitted today for her third cycle of treatment.  She is planning on going down to [place] to see her daughter and grandchildren for Christmas.  We will certainly work to have treatment revolve around her travel.  Marland Kitchen    [name], MD 12/2/20248:05 AM

## 2023-01-29 NOTE — Patient Instructions (Signed)

## 2023-01-29 NOTE — Progress Notes (Signed)
Patient receiving bicarb infusion for urinary alkalinization to facilitate high-dose methotrexate administration (hopefully tomorrow, 12/3)  Also ordered Rituximab infusion to be given today, 12/2 Patient only has single-lumen port, and Rituximab incompatible with Na Bicarb infusion Have verbally instructed RN, Hilton Cork, to hold bicarbonate infusion while Rituximab being administered. Port should then be well-flushed, after which bicarbonate infusion should then be resumed. RN voiced understanding, and will hand this information off to PM nurse. Generally check urine pH q6 hr while on bicarbonate infusion, but given extended off period while Rituximab being given, rechecking UA prior to midnight will be unhelpful - will recheck UA in AM and adjust bicarb rate accordingly  Bernadene Person, PharmD, BCPS [phone] 01/29/2023, 2:28 PM

## 2023-01-30 ENCOUNTER — Encounter (HOSPITAL_COMMUNITY): Payer: Self-pay | Admitting: Hematology & Oncology

## 2023-01-30 DIAGNOSIS — C8580 Other specified types of non-Hodgkin lymphoma, unspecified site: Secondary | ICD-10-CM

## 2023-01-30 LAB — CBC
HCT: 30.3 % — ABNORMAL LOW (ref 36.0–46.0)
Hemoglobin: 10.4 g/dL — ABNORMAL LOW (ref 12.0–15.0)
MCH: 33.4 pg (ref 26.0–34.0)
MCHC: 34.3 g/dL (ref 30.0–36.0)
MCV: 97.4 fL (ref 80.0–100.0)
Platelets: 170 10*3/uL (ref 150–400)
RBC: 3.11 MIL/uL — ABNORMAL LOW (ref 3.87–5.11)
RDW: 12.9 % (ref 11.5–15.5)
WBC: 3.1 10*3/uL — ABNORMAL LOW (ref 4.0–10.5)
nRBC: 0 % (ref 0.0–0.2)

## 2023-01-30 LAB — COMPREHENSIVE METABOLIC PANEL
ALT: 17 U/L (ref 0–44)
AST: 17 U/L (ref 15–41)
Albumin: 3.4 g/dL — ABNORMAL LOW (ref 3.5–5.0)
Alkaline Phosphatase: 42 U/L (ref 38–126)
Anion gap: 7 (ref 5–15)
BUN: 14 mg/dL (ref 8–23)
CO2: 28 mmol/L (ref 22–32)
Calcium: 8.8 mg/dL — ABNORMAL LOW (ref 8.9–10.3)
Chloride: 103 mmol/L (ref 98–111)
Creatinine, Ser: 0.56 mg/dL (ref 0.44–1.00)
GFR, Estimated: >60 mL/min (ref >60)
Glucose, Bld: 118 mg/dL — ABNORMAL HIGH (ref 70–99)
Potassium: 3.4 mmol/L — ABNORMAL LOW (ref 3.5–5.1)
Sodium: 138 mmol/L (ref 135–145)
Total Bilirubin: 0.5 mg/dL (ref <1.2)
Total Protein: 5.3 g/dL — ABNORMAL LOW (ref 6.5–8.1)

## 2023-01-30 LAB — URINALYSIS, DIPSTICK ONLY
Bilirubin Urine: NEGATIVE
Glucose, UA: NEGATIVE mg/dL
Hgb urine dipstick: NEGATIVE
Ketones, ur: NEGATIVE mg/dL
Nitrite: NEGATIVE
Protein, ur: NEGATIVE mg/dL
Specific Gravity, Urine: 1.008 (ref 1.005–1.030)
pH: 8 (ref 5.0–8.0)

## 2023-01-30 MED ORDER — ONDANSETRON HCL 4 MG/2ML IJ SOLN
Freq: Once | INTRAMUSCULAR | Status: AC
  Administered 2023-01-30: 16 mg via INTRAVENOUS
  Filled 2023-01-30: qty 8

## 2023-01-30 MED ORDER — CHLORHEXIDINE GLUCONATE CLOTH 2 % EX PADS
6.0000 | MEDICATED_PAD | Freq: Every day | CUTANEOUS | Status: DC
Start: 2023-01-30 — End: 2023-02-03
  Administered 2023-01-30 – 2023-02-02 (×4): 6 via TOPICAL

## 2023-01-30 MED ORDER — DEXTROSE 5 % IV SOLN
4.0000 g/m2 | Freq: Once | INTRAVENOUS | Status: AC
  Administered 2023-01-30: 7.2 g via INTRAVENOUS
  Filled 2023-01-30: qty 144

## 2023-01-30 NOTE — Progress Notes (Addendum)
Subjective:   Patient is a 67 y.o. female admitted on 01/29/2023 for chemotherapy Methotrexate 7.2 mg via Port.  Blood return noted.    Today is day Day 2/Cycle 4 of therapy.   Patient complaints: NONE   Symptoms are stable during the past 24 hours.  Advance Directives:   Appropriate portions of the patient's past medical, surgical, family, and social history were reviewed and updated.   Objective:   Patient Vitals for the past 8 hrs:  BP Temp Temp src Pulse Resp SpO2 Height Weight  01/30/23 0553 99/84 98.2 F (36.8 C) Oral 68 14 96 % -- --  01/30/23 0535 -- -- -- -- -- -- 5\' 6"  (1.676 m) 70.8 kg

## 2023-01-30 NOTE — Progress Notes (Signed)
Ariel [name] is doing okay this morning.  She had her Rituxan yesterday.  She will go for the methotrexate today.  I do appreciate Pharmacy help me out with keeping the urine alkalized.  This morning, I think her urine pH is 8.  She has some white cells in the urine.  I will send off a urine culture..  She is having no headache.  There is no mouth sores.  She is using the mouth rinses.  She is not have a diarrhea.  There is no nausea or vomiting.  She is having no rashes.  There is no bleeding.  Her labs show a sodium of 134.  Potassium 3.4.  BUN 14 creatinine 0.56.  Calcium 8.8 with an albumin of 3.4.  Her white cell count 3.1.  Hemoglobin 10.4.  Platelet count 170,000.  Her vital signs show temperature 98.2.  Pulse 68.  Blood pressure 99/84.  Her head and neck exam shows no ocular or oral lesions.  There are no palpable cervical or supraclavicular lymph nodes.  Lungs are clear.  Cardiac exam regular rate and rhythm.  Abdomen is soft.  Bowel sounds are present.  There is no fluid wave.  There is no palpable liver or spleen tip.  Extremities shows no clubbing, cyanosis or edema.  Neurological exam is nonfocal.  Will go ahead with the methotrexate today.  She will then start the leucovorin rescue tomorrow.  We will still monitor her labs.  I know that she is getting fantastic care from everybody up on 6 E.    Christin Bach, MD  Molli Hazard 6:21

## 2023-01-30 NOTE — Plan of Care (Signed)

## 2023-01-30 NOTE — Progress Notes (Signed)
Mobility Specialist - Progress Note   01/30/23 1054  Mobility  Activity Ambulated independently in hallway  Level of Assistance Independent  Assistive Device None  Distance Ambulated (ft) 500 ft  Activity Response Tolerated well  Mobility Referral Yes  $Mobility charge 1 Mobility  Mobility Specialist Start Time (ACUTE ONLY) 1044  Mobility Specialist Stop Time (ACUTE ONLY) 1053  Mobility Specialist Time Calculation (min) (ACUTE ONLY) 9 min   Pt received in bed and agreeable to mobility. No complaints during session. Pt to bed after session with all needs met.    [facility]

## 2023-01-31 ENCOUNTER — Other Ambulatory Visit: Payer: Self-pay

## 2023-01-31 DIAGNOSIS — C8580 Other specified types of non-Hodgkin lymphoma, unspecified site: Secondary | ICD-10-CM | POA: Diagnosis not present

## 2023-01-31 LAB — CBC WITH DIFFERENTIAL/PLATELET
Abs Immature Granulocytes: 0.01 10*3/uL (ref 0.00–0.07)
Basophils Absolute: 0 10*3/uL (ref 0.0–0.1)
Basophils Relative: 0 %
Eosinophils Absolute: 0.1 10*3/uL (ref 0.0–0.5)
Eosinophils Relative: 2 %
HCT: 31.5 % — ABNORMAL LOW (ref 36.0–46.0)
Hemoglobin: 10.6 g/dL — ABNORMAL LOW (ref 12.0–15.0)
Immature Granulocytes: 0 %
Lymphocytes Relative: 23 %
Lymphs Abs: 1.3 10*3/uL (ref 0.7–4.0)
MCH: 33.1 pg (ref 26.0–34.0)
MCHC: 33.7 g/dL (ref 30.0–36.0)
MCV: 98.4 fL (ref 80.0–100.0)
Monocytes Absolute: 0.5 10*3/uL (ref 0.1–1.0)
Monocytes Relative: 9 %
Neutro Abs: 3.6 10*3/uL (ref 1.7–7.7)
Neutrophils Relative %: 66 %
Platelets: 178 10*3/uL (ref 150–400)
RBC: 3.2 MIL/uL — ABNORMAL LOW (ref 3.87–5.11)
RDW: 12.4 % (ref 11.5–15.5)
WBC: 5.5 10*3/uL (ref 4.0–10.5)
nRBC: 0 % (ref 0.0–0.2)

## 2023-01-31 LAB — COMPREHENSIVE METABOLIC PANEL
ALT: 50 U/L — ABNORMAL HIGH (ref 0–44)
AST: 36 U/L (ref 15–41)
Albumin: 3.7 g/dL (ref 3.5–5.0)
Alkaline Phosphatase: 48 U/L (ref 38–126)
Anion gap: 8 (ref 5–15)
BUN: 16 mg/dL (ref 8–23)
CO2: 33 mmol/L — ABNORMAL HIGH (ref 22–32)
Calcium: 9.5 mg/dL (ref 8.9–10.3)
Chloride: 98 mmol/L (ref 98–111)
Creatinine, Ser: 0.82 mg/dL (ref 0.44–1.00)
GFR, Estimated: >60 mL/min (ref >60)
Glucose, Bld: 107 mg/dL — ABNORMAL HIGH (ref 70–99)
Potassium: 3.3 mmol/L — ABNORMAL LOW (ref 3.5–5.1)
Sodium: 139 mmol/L (ref 135–145)
Total Bilirubin: 0.5 mg/dL (ref <1.2)
Total Protein: 5.6 g/dL — ABNORMAL LOW (ref 6.5–8.1)

## 2023-01-31 LAB — URINE CULTURE
Culture: <10000 — AB
Special Requests: NORMAL

## 2023-01-31 LAB — URINALYSIS, DIPSTICK ONLY
Bacteria, UA: NONE SEEN
Bilirubin Urine: NEGATIVE
Glucose, UA: NEGATIVE mg/dL
Hgb urine dipstick: NEGATIVE
Ketones, ur: NEGATIVE mg/dL
Leukocytes,Ua: NEGATIVE
Nitrite: NEGATIVE
Protein, ur: NEGATIVE mg/dL
Specific Gravity, Urine: 1.011 (ref 1.005–1.030)
pH: 8 (ref 5.0–8.0)

## 2023-01-31 LAB — METHOTREXATE: Methotrexate: 9.85

## 2023-01-31 MED ORDER — SODIUM CHLORIDE 0.9 % IV SOLN
8.0000 mg | Freq: Three times a day (TID) | INTRAVENOUS | Status: DC
  Filled 2023-01-31 (×2): qty 4

## 2023-01-31 MED ORDER — POTASSIUM CHLORIDE 10 MEQ/50ML IV SOLN
10.0000 meq | INTRAVENOUS | Status: AC
Start: 2023-01-31 — End: 2023-01-31
  Administered 2023-01-31 (×3): 10 meq via INTRAVENOUS
  Filled 2023-01-31 (×2): qty 50

## 2023-01-31 MED ORDER — LEUCOVORIN CALCIUM INJECTION 100 MG
15.0000 mg | Freq: Four times a day (QID) | INTRAMUSCULAR | Status: AC
  Administered 2023-01-31 – 2023-02-01 (×4): 16 mg via INTRAVENOUS
  Filled 2023-01-31 (×8): qty 0.8

## 2023-01-31 MED ORDER — ACETAMINOPHEN 500 MG PO TABS
500.0000 mg | ORAL_TABLET | Freq: Four times a day (QID) | ORAL | Status: DC | PRN
  Administered 2023-01-31 – 2023-02-03 (×4): 500 mg via ORAL
  Filled 2023-01-31 (×4): qty 1

## 2023-01-31 MED ORDER — ONDANSETRON 8 MG/NS 50 ML IVPB
8.0000 mg | Freq: Three times a day (TID) | INTRAVENOUS | Status: AC
  Administered 2023-01-31 – 2023-02-01 (×6): 8 mg via INTRAVENOUS
  Filled 2023-01-31 (×3): qty 8
  Filled 2023-01-31: qty 54
  Filled 2023-01-31 (×3): qty 8

## 2023-01-31 NOTE — Progress Notes (Signed)
Ariel [name] finished her chemotherapy yesterday.  She has some nausea and vomiting last night.  She feels good this morning.  I will have her take Zofran on a scheduled for the next 2 days.  She will start her leucovorin today.  I would very much appreciate Pharmacy help out with this.  We will start checking her methotrexate levels.  She has had no mouth sores.  She has had no cough or shortness of breath.  There has been no headache.  She has had no dysuria.  She has had no diarrhea.  Her sodium 139.  Potassium 3.3.  BUN 16 creatinine 0.82.  Calcium 9.5 with an albumin of 3.7.  Her SGPT is 50.  The white cell count 5.5.  Hemoglobin 10.6.  Platelet count 178,000.   Her vital signs are temperature of 98.  Pulse 72.  Blood pressure 118/65.  Head and neck exam shows no ocular or oral lesions.  She has no mucositis.  Lungs are clear bilaterally.  She has good air movement bilaterally.  Cardiac exam regular rate and rhythm.  There are no murmurs.  Abdomen is soft.  Bowel sounds are present.  She has no fluid wave.  There is no guarding or rebound tenderness.  There is no palpable liver or spleen tip.  Extremities shows no clubbing, cyanosis or edema.  Neurological exam shows no focal neurological deficits.   Ariel [name] is finished her third cycle of Rituxan/high-dose methotrexate.  We now have to let the methotrexate get out of her system.  She will will be on leucovorin.  She typically clears the methotrexate in about 3 days.  I will make sure that she has little extra potassium today.  I know that she is getting incredible care from all the staff up on 6 E.  I really appreciate their compassion.   Christin Bach, MD  Barron Schmid

## 2023-01-31 NOTE — Plan of Care (Signed)

## 2023-02-01 DIAGNOSIS — C8580 Other specified types of non-Hodgkin lymphoma, unspecified site: Secondary | ICD-10-CM | POA: Diagnosis not present

## 2023-02-01 LAB — CBC WITH DIFFERENTIAL/PLATELET
Abs Immature Granulocytes: 0.01 10*3/uL (ref 0.00–0.07)
Basophils Absolute: 0 10*3/uL (ref 0.0–0.1)
Basophils Relative: 0 %
Eosinophils Absolute: 0.1 10*3/uL (ref 0.0–0.5)
Eosinophils Relative: 3 %
HCT: 31.4 % — ABNORMAL LOW (ref 36.0–46.0)
Hemoglobin: 10.9 g/dL — ABNORMAL LOW (ref 12.0–15.0)
Immature Granulocytes: 0 %
Lymphocytes Relative: 29 %
Lymphs Abs: 0.9 10*3/uL (ref 0.7–4.0)
MCH: 34.2 pg — ABNORMAL HIGH (ref 26.0–34.0)
MCHC: 34.7 g/dL (ref 30.0–36.0)
MCV: 98.4 fL (ref 80.0–100.0)
Monocytes Absolute: 0.4 10*3/uL (ref 0.1–1.0)
Monocytes Relative: 12 %
Neutro Abs: 1.8 10*3/uL (ref 1.7–7.7)
Neutrophils Relative %: 56 %
Platelets: 158 10*3/uL (ref 150–400)
RBC: 3.19 MIL/uL — ABNORMAL LOW (ref 3.87–5.11)
RDW: 12.4 % (ref 11.5–15.5)
WBC: 3.2 10*3/uL — ABNORMAL LOW (ref 4.0–10.5)
nRBC: 0 % (ref 0.0–0.2)

## 2023-02-01 LAB — URINALYSIS, DIPSTICK ONLY
Bilirubin Urine: NEGATIVE
Glucose, UA: NEGATIVE mg/dL
Hgb urine dipstick: NEGATIVE
Ketones, ur: NEGATIVE mg/dL
Leukocytes,Ua: NEGATIVE
Nitrite: NEGATIVE
Protein, ur: NEGATIVE mg/dL
Specific Gravity, Urine: 1.006 (ref 1.005–1.030)
pH: 9 — ABNORMAL HIGH (ref 5.0–8.0)

## 2023-02-01 LAB — COMPREHENSIVE METABOLIC PANEL
ALT: 76 U/L — ABNORMAL HIGH (ref 0–44)
AST: 50 U/L — ABNORMAL HIGH (ref 15–41)
Albumin: 3.7 g/dL (ref 3.5–5.0)
Alkaline Phosphatase: 46 U/L (ref 38–126)
Anion gap: 8 (ref 5–15)
BUN: 13 mg/dL (ref 8–23)
CO2: 33 mmol/L — ABNORMAL HIGH (ref 22–32)
Calcium: 9.7 mg/dL (ref 8.9–10.3)
Chloride: 99 mmol/L (ref 98–111)
Creatinine, Ser: 0.83 mg/dL (ref 0.44–1.00)
GFR, Estimated: >60 mL/min (ref >60)
Glucose, Bld: 119 mg/dL — ABNORMAL HIGH (ref 70–99)
Potassium: 3.7 mmol/L (ref 3.5–5.1)
Sodium: 140 mmol/L (ref 135–145)
Total Bilirubin: 0.6 mg/dL (ref <1.2)
Total Protein: 5.5 g/dL — ABNORMAL LOW (ref 6.5–8.1)

## 2023-02-01 MED ORDER — LEUCOVORIN CALCIUM INJECTION 100 MG
16.0000 mg | Freq: Four times a day (QID) | INTRAMUSCULAR | Status: AC
  Administered 2023-02-01 – 2023-02-02 (×4): 16 mg via INTRAVENOUS
  Filled 2023-02-01 (×11): qty 0.8

## 2023-02-01 NOTE — Progress Notes (Signed)
Mobility Specialist - Progress Note   02/01/23 1313  Mobility  Activity Ambulated independently in hallway  Level of Assistance Independent  Assistive Device None  Distance Ambulated (ft) 500 ft  Activity Response Tolerated well  Mobility Referral Yes  Mobility visit 1 Mobility  Mobility Specialist Start Time (ACUTE ONLY) 1304  Mobility Specialist Stop Time (ACUTE ONLY) 1312  Mobility Specialist Time Calculation (min) (ACUTE ONLY) 8 min   Pt received in bed and agreeable to mobility. No complaints during session. Pt to bed after session with all needs met.    [facility]

## 2023-02-01 NOTE — Progress Notes (Signed)
  Progress Note   Date: 02/01/2023  Patient Name: Ariel [name]        MRN#: [id number]  Potassium was given for hypokalemia.  This is due to her methotrexate.  Cindee Lame

## 2023-02-01 NOTE — Progress Notes (Signed)
  Progress Note   Date: 02/01/2023  Patient Name: Ariel [name]        MRN#: [id number]  Review the patient's clinical findings supports the diagnosis of:   Not Clinically Significant

## 2023-02-01 NOTE — Progress Notes (Signed)
So far, Ariel [name] has been doing quite nicely.  She really had no problems with nausea or vomiting yesterday.  We will see what her methotrexate level is.  She does have some elevated LFTs which are common for her after having the high-dose methotrexate.  She is out of bed.  I do not think she has a urinary tract infection.  We will see what the final results show.  She has had no fever.  There is been no mouth sores.  She has had no diarrhea.  There has been no dysuria.  Her labs show sodium 140.  Potassium 3.7.  BUN 13 creatinine 0.83.  Calcium 9.7 with an albumin of 3.7.  SGPT 76 SGOT 50.  Her white cell count 3.2.  Hemoglobin 10.9.  Platelet count 158,000.  She has had no headache.  There is been no visual changes.   On her physical exam, temperature 98.1.  Pulse 78.  Blood pressure 113/54.  Head and neck exam shows no mucositis in the oral cavity.  There is no ocular lesions.  She has no adenopathy in the neck.  Lungs are clear bilaterally.  Cardiac exam regular rate and rhythm.  Abdomen is soft.  Bowel sounds are present.  There is no fluid wave.  There is no palpable hepatosplenomegaly.  Extremities shows no clubbing, cyanosis or edema.  Neurological exam shows no focal neurological deficits.  Skin exam shows no rashes.   Ariel [name] has received her third cycle of Rituxan/high-dose methotrexate.  We are now awaiting the methotrexate levels to come down so we can let her go home.  She currently is on leucovorin rescue.  I very much appreciate the great help from Pharmacy with leucovorin.  Like to hope that she will be able to go home tomorrow although she likely will go home on Saturday.  She is incredibly appreciative, as well as I am, of the wonderful care that she is getting from all the staff up on 6 E.   Christin Bach, MD  [place] 5:2

## 2023-02-01 NOTE — Consult Note (Signed)
[facility] Liaison Consult Note    02/01/2023  Ariel [name] 05-Sep-1955 [id number]  Insurance: [employer]   Primary Care Provider: Sandford Craze, NP with Corinda Gubler at [facility], this provider is listed for the transition of care follow up appointments  and [facility] calls   [facility] Liaison screened the patient remotely at [facility].    The patient was screened for 30 day readmission hospitalization with noted high risk score for unplanned readmission risk 4 hospital admissions in 6 months.  The patient was assessed for potential Community Care Coordination service needs for post hospital transition for care coordination. Patient recently declined 30 day TOC follow up program with [facility] RN. Patient resides at [facility] noted. Review of patient's electronic medical record reveals patient is planned admissions for ongoing chemotherapy per MD progress notes 01/29/23.Marland Kitchen   Plan: [facility] Liaison will continue to follow progress and disposition to asess for post hospital community care coordination/management needs.  Referral request for community care coordination: no current community care coordination needs noted.   [facility] Community Care, Population Health does not replace or interfere with any arrangements made by the Inpatient Transition of Care team.   For questions contact:   Charlesetta Shanks, RN, BSN, CCM [place]  [facility], Population Health, [facility] Liaison Direct Dial: [phone] or secure chat Email: [email or web address]

## 2023-02-02 DIAGNOSIS — C8580 Other specified types of non-Hodgkin lymphoma, unspecified site: Secondary | ICD-10-CM | POA: Diagnosis not present

## 2023-02-02 LAB — CBC WITH DIFFERENTIAL/PLATELET
Abs Immature Granulocytes: 0 10*3/uL (ref 0.00–0.07)
Basophils Absolute: 0 10*3/uL (ref 0.0–0.1)
Basophils Relative: 0 %
Eosinophils Absolute: 0.1 10*3/uL (ref 0.0–0.5)
Eosinophils Relative: 3 %
HCT: 30.7 % — ABNORMAL LOW (ref 36.0–46.0)
Hemoglobin: 10.5 g/dL — ABNORMAL LOW (ref 12.0–15.0)
Immature Granulocytes: 0 %
Lymphocytes Relative: 34 %
Lymphs Abs: 1 10*3/uL (ref 0.7–4.0)
MCH: 33.5 pg (ref 26.0–34.0)
MCHC: 34.2 g/dL (ref 30.0–36.0)
MCV: 98.1 fL (ref 80.0–100.0)
Monocytes Absolute: 0.2 10*3/uL (ref 0.1–1.0)
Monocytes Relative: 8 %
Neutro Abs: 1.6 10*3/uL — ABNORMAL LOW (ref 1.7–7.7)
Neutrophils Relative %: 55 %
Platelets: 156 10*3/uL (ref 150–400)
RBC: 3.13 MIL/uL — ABNORMAL LOW (ref 3.87–5.11)
RDW: 12.3 % (ref 11.5–15.5)
WBC: 2.9 10*3/uL — ABNORMAL LOW (ref 4.0–10.5)
nRBC: 0 % (ref 0.0–0.2)

## 2023-02-02 LAB — COMPREHENSIVE METABOLIC PANEL
ALT: 129 U/L — ABNORMAL HIGH (ref 0–44)
AST: 80 U/L — ABNORMAL HIGH (ref 15–41)
Albumin: 3.6 g/dL (ref 3.5–5.0)
Alkaline Phosphatase: 44 U/L (ref 38–126)
Anion gap: 11 (ref 5–15)
BUN: 12 mg/dL (ref 8–23)
CO2: 30 mmol/L (ref 22–32)
Calcium: 9.4 mg/dL (ref 8.9–10.3)
Chloride: 97 mmol/L — ABNORMAL LOW (ref 98–111)
Creatinine, Ser: 0.79 mg/dL (ref 0.44–1.00)
GFR, Estimated: >60 mL/min (ref >60)
Glucose, Bld: 142 mg/dL — ABNORMAL HIGH (ref 70–99)
Potassium: 3.3 mmol/L — ABNORMAL LOW (ref 3.5–5.1)
Sodium: 138 mmol/L (ref 135–145)
Total Bilirubin: 0.5 mg/dL (ref <1.2)
Total Protein: 5.6 g/dL — ABNORMAL LOW (ref 6.5–8.1)

## 2023-02-02 LAB — URINALYSIS, DIPSTICK ONLY
Bilirubin Urine: NEGATIVE
Glucose, UA: NEGATIVE mg/dL
Hgb urine dipstick: NEGATIVE
Ketones, ur: NEGATIVE mg/dL
Leukocytes,Ua: NEGATIVE
Nitrite: NEGATIVE
Protein, ur: NEGATIVE mg/dL
Specific Gravity, Urine: 1.011 (ref 1.005–1.030)
pH: 8 (ref 5.0–8.0)

## 2023-02-02 MED ORDER — ONDANSETRON 8 MG/NS 50 ML IVPB
8.0000 mg | Freq: Three times a day (TID) | INTRAVENOUS | Status: DC
  Filled 2023-02-02 (×2): qty 54

## 2023-02-02 MED ORDER — LEUCOVORIN CALCIUM INJECTION 100 MG
16.0000 mg | Freq: Four times a day (QID) | INTRAMUSCULAR | Status: DC
  Administered 2023-02-02 – 2023-02-03 (×3): 16 mg via INTRAVENOUS
  Filled 2023-02-02 (×4): qty 0.8

## 2023-02-02 MED ORDER — ONDANSETRON 8 MG/NS 50 ML IVPB
8.0000 mg | Freq: Three times a day (TID) | INTRAVENOUS | Status: DC
Start: 2023-02-02 — End: 2023-02-02

## 2023-02-02 MED ORDER — POTASSIUM CHLORIDE 10 MEQ/50ML IV SOLN
10.0000 meq | INTRAVENOUS | Status: AC
  Administered 2023-02-02 (×4): 10 meq via INTRAVENOUS
  Filled 2023-02-02 (×4): qty 50

## 2023-02-02 MED ORDER — FLUCONAZOLE 100 MG PO TABS
100.0000 mg | ORAL_TABLET | Freq: Every day | ORAL | Status: DC
  Administered 2023-02-02: 100 mg via ORAL
  Filled 2023-02-02: qty 1

## 2023-02-02 MED ORDER — ONDANSETRON 8 MG/NS 50 ML IVPB
8.0000 mg | Freq: Four times a day (QID) | INTRAVENOUS | Status: DC | PRN
  Administered 2023-02-02: 8 mg via INTRAVENOUS
  Filled 2023-02-02: qty 8
  Filled 2023-02-02: qty 54

## 2023-02-02 MED ORDER — CIPROFLOXACIN HCL 500 MG PO TABS
500.0000 mg | ORAL_TABLET | Freq: Every day | ORAL | Status: DC
  Administered 2023-02-03: 500 mg via ORAL
  Filled 2023-02-02: qty 1

## 2023-02-02 NOTE — Progress Notes (Signed)
Mobility Specialist - Progress Note   02/02/23 1207  Mobility  Activity Ambulated independently in hallway  Level of Assistance Independent  Assistive Device None  Distance Ambulated (ft) 500 ft  Activity Response Tolerated well  Mobility Referral Yes  Mobility visit 1 Mobility  Mobility Specialist Start Time (ACUTE ONLY) 1158  Mobility Specialist Stop Time (ACUTE ONLY) 1207  Mobility Specialist Time Calculation (min) (ACUTE ONLY) 9 min   Pt received in bed and agreeable to mobility. No complaints during session. Pt to EOB after session with all needs met.    [facility]

## 2023-02-02 NOTE — Progress Notes (Signed)
Overall, she is still doing quite well.  Her methotrexate level is coming down nicely.  She is doing well on the leucovorin rescue.  Again, I totally appreciate Pharmacy's help with this dosing.  She does have little bit of nausea.  Zofran does seem to help her..  She is not having any diarrhea.  There is no dysuria.  She is having no cough or shortness of breath.  There is no leg swelling.  She has had no rashes.  There has been no mouth sores.  Her labs do show potassium 3.3.  Will give some extra potassium today.  Her LFTs are elevated, as always after high-dose methotrexate.  Her SGPT is 129 SGOT 80.  Her BUN is 12 creatinine 0.79.  Her white cell count is 2.9.  Hemoglobin 10.5.  Platelet count 156,000.  Her vital signs are temperature of 98 3.  Pulse 71.  Blood pressure 117/60.  Her head and neck exam shows no ocular or oral lesions.  She has no mucositis.  Lungs are clear bilaterally.  She has good air movement bilaterally.  Cardiac exam regular rate and rhythm.  Abdomen is soft.  She has good bowel sounds.  There is no fluid wave.  There is no palpable liver or spleen tip.  Extremity shows no clubbing, cyanosis or edema.  She has good range of motion of her joints.  Neurological exam is nonfocal.  Skin exam shows no rashes.  Hopefully, we can let Ms. cross go home tomorrow.  And we will see what her labs look like.  I would think that her methotrexate level should be low enough.  As always, she is getting great care from everybody up on 6 E.  Ariel Bach, MD  Duwayne Heck 9:6

## 2023-02-02 NOTE — TOC Initial Note (Signed)
Transition of Care [facility]) - Initial/Assessment Note    Patient Details  Name: Ariel [name] MRN: [id number] Date of Birth: [birth year]  Transition of Care [facility]) CM/SW Contact:    Adrian Prows, RN Phone Number: 02/02/2023, 11:24 AM  Clinical Narrative:                 TOC for d/c planning; spoke w/ pt in room; pt says she lives at [facility] [place]; she plans to return at d/c; pt verified she has insurance/PCP; she denies SDOH risks; pt says she has transportation; pt says she has a cane, BSC, and grab bars in shower; she does not have HH services or home oxygen; no TOC needs at present; TOC will follow.  Expected Discharge Plan: Home/Self Care Barriers to Discharge: Continued Medical Work up   Patient Goals and CMS Choice Patient states their goals for this hospitalization and ongoing recovery are:: home CMS Medicare.gov Compare Post Acute Care list provided to:: Patient        Expected Discharge Plan and Services   Discharge Planning Services: CM Consult   Living arrangements for the past 2 months: Independent Living Facility                                      Prior Living Arrangements/Services Living arrangements for the past 2 months: Independent Living Facility Lives with:: Self Patient language and need for interpreter reviewed:: Yes Do you feel safe going back to the place where you live?: Yes      Need for Family Participation in Patient Care: Yes (Comment) Care giver support system in place?: Yes (comment) Current home services: DME (cane, BSC) Criminal Activity/Legal Involvement Pertinent to Current Situation/Hospitalization: No - Comment as needed  Activities of Daily Living   ADL Screening (condition at time of admission) Independently performs ADLs?: Yes (appropriate for developmental age) Does the patient have a NEW difficulty with bathing/dressing/toileting/self-feeding that is expected to last >3 days?: No Does the patient have a NEW  difficulty with getting in/out of bed, walking, or climbing stairs that is expected to last >3 days?: No Does the patient have a NEW difficulty with communication that is expected to last >3 days?: No Is the patient deaf or have difficulty hearing?: No Does the patient have difficulty seeing, even when wearing glasses/contacts?: No Does the patient have difficulty concentrating, remembering, or making decisions?: No  Permission Sought/Granted Permission sought to share information with : Case Manager Permission granted to share information with : Yes, Verbal Permission Granted  Share Information with NAME: Case Manager     Permission granted to share info w Relationship: [place] Aughenbaugh (son) [phone]     Emotional Assessment Appearance:: Appears stated age Attitude/Demeanor/Rapport: Gracious Affect (typically observed): Accepting Orientation: : Oriented to Self, Oriented to Place, Oriented to  Time, Oriented to Situation Alcohol / Substance Use: Not Applicable Psych Involvement: No (comment)  Admission diagnosis:  Lymphoma malignant, large cell (HCC) [C85.80] Patient Active Problem List   Diagnosis Date Noted   Lymphoma malignant, large cell (HCC) 11/27/2022   UTI (urinary tract infection) 10/30/2022   Ataxia 10/29/2022   Leukocytosis 04/14/2022   Thrombocytosis, unspecified 04/14/2022   Epistaxis 04/08/2022   COVID-19 virus infection 04/05/2022   Hypokalemia 04/05/2022   Hypophosphatemia 04/05/2022   SIADH (syndrome of inappropriate ADH production) (HCC) 04/05/2022   Protein-calorie malnutrition, moderate (HCC) 03/28/2022   Palliative care by specialist 03/23/2022  Aphasia 03/17/2022   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   Primary localized osteoarthritis of right hip 01/10/2022   Primary localized osteoarthritis of left hip 10/11/2021   Preoperative clearance 09/13/2021   Encounter for screening mammogram for malignant neoplasm of breast 09/13/2021   Pancytopenia (HCC) 05/16/2021    Lymphoma malignant, 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   Diffuse large B cell lymphoma (HCC) 03/07/2021   High grade B-cell lymphoma (HCC) 12/22/2020   Goals of care, counseling/discussion 12/22/2020   Malignant neoplasm metastatic to brain (HCC) 12/09/2020   Brain tumor (HCC) 12/08/2020   Night sweat 11/30/2020   Complication of anesthesia 11/22/2020   PONV (postoperative nausea and vomiting) 11/22/2020   Hyperglycemia 11/10/2020   TIA (transient ischemic attack) 10/07/2020   Iron deficiency anemia 10/07/2020   Melanoma (HCC) 2021   Multinodular goiter 12/22/2013   Subclinical hyperthyroidism 12/19/2013   Urinary incontinence 11/19/2013   Hyperlipidemia 11/19/2012   Routine general medical examination at a health care facility 11/18/2012   PCP:  Sandford Craze, NP Pharmacy:   [facility] [place], [place] - [address] [place] [address] Phone: [phone] Fax: [phone]     Social Determinants of Health (SDOH) Social History: SDOH Screenings   Food Insecurity: No Food Insecurity (02/02/2023)  Housing: Low Risk  (02/02/2023)  Transportation Needs: No Transportation Needs (02/02/2023)  Utilities: Not At Risk (02/02/2023)  Alcohol Screen: Low Risk  (09/05/2022)  Depression (PHQ2-9): Low Risk  (09/06/2022)  Financial Resource Strain: Low Risk  (09/05/2022)  Physical Activity: Sufficiently Active (09/05/2022)  Social Connections: Moderately Isolated (09/05/2022)  Stress: No Stress Concern Present (09/05/2022)  Tobacco Use: Low Risk  (01/30/2023)   SDOH Interventions: Food Insecurity Interventions: Intervention Not Indicated, Inpatient TOC Housing Interventions: Intervention Not Indicated, Inpatient TOC Transportation Interventions: Intervention Not Indicated, Inpatient TOC Utilities Interventions: Intervention Not Indicated, Inpatient TOC   Readmission Risk Interventions    02/02/2023   11:21 AM 01/12/2023   12:30  PM 10/30/2022    1:29 PM  Readmission Risk Prevention Plan  Transportation Screening Complete Complete Complete  PCP or Specialist Appt within 5-7 Days   Complete  PCP or Specialist Appt within 3-5 Days Complete Complete   Home Care Screening   Complete  Medication Review (RN CM)   Complete  HRI or Home Care Consult Complete Complete   Social Work Consult for Recovery Care Planning/Counseling Complete Complete   Palliative Care Screening Not Applicable Not Applicable   Medication Review Oceanographer) Complete Complete

## 2023-02-02 NOTE — Plan of Care (Signed)

## 2023-02-03 ENCOUNTER — Other Ambulatory Visit: Payer: Self-pay

## 2023-02-03 DIAGNOSIS — C8580 Other specified types of non-Hodgkin lymphoma, unspecified site: Secondary | ICD-10-CM | POA: Diagnosis not present

## 2023-02-03 LAB — CBC WITH DIFFERENTIAL/PLATELET
Abs Immature Granulocytes: 0.01 10*3/uL (ref 0.00–0.07)
Basophils Absolute: 0 10*3/uL (ref 0.0–0.1)
Basophils Relative: 0 %
Eosinophils Absolute: 0.1 10*3/uL (ref 0.0–0.5)
Eosinophils Relative: 2 %
HCT: 30.3 % — ABNORMAL LOW (ref 36.0–46.0)
Hemoglobin: 10.5 g/dL — ABNORMAL LOW (ref 12.0–15.0)
Immature Granulocytes: 0 %
Lymphocytes Relative: 21 %
Lymphs Abs: 1 10*3/uL (ref 0.7–4.0)
MCH: 34.2 pg — ABNORMAL HIGH (ref 26.0–34.0)
MCHC: 34.7 g/dL (ref 30.0–36.0)
MCV: 98.7 fL (ref 80.0–100.0)
Monocytes Absolute: 0.2 10*3/uL (ref 0.1–1.0)
Monocytes Relative: 5 %
Neutro Abs: 3.2 10*3/uL (ref 1.7–7.7)
Neutrophils Relative %: 72 %
Platelets: 154 10*3/uL (ref 150–400)
RBC: 3.07 MIL/uL — ABNORMAL LOW (ref 3.87–5.11)
RDW: 12.1 % (ref 11.5–15.5)
WBC: 4.5 10*3/uL (ref 4.0–10.5)
nRBC: 0 % (ref 0.0–0.2)

## 2023-02-03 LAB — URINALYSIS, DIPSTICK ONLY
Bilirubin Urine: NEGATIVE
Glucose, UA: NEGATIVE mg/dL
Hgb urine dipstick: NEGATIVE
Ketones, ur: NEGATIVE mg/dL
Leukocytes,Ua: NEGATIVE
Nitrite: NEGATIVE
Protein, ur: NEGATIVE mg/dL
Specific Gravity, Urine: 1.009 (ref 1.005–1.030)
pH: 8 (ref 5.0–8.0)

## 2023-02-03 LAB — COMPREHENSIVE METABOLIC PANEL
ALT: 167 U/L — ABNORMAL HIGH (ref 0–44)
AST: 85 U/L — ABNORMAL HIGH (ref 15–41)
Albumin: 3.7 g/dL (ref 3.5–5.0)
Alkaline Phosphatase: 43 U/L (ref 38–126)
Anion gap: 8 (ref 5–15)
BUN: 16 mg/dL (ref 8–23)
CO2: 28 mmol/L (ref 22–32)
Calcium: 9.4 mg/dL (ref 8.9–10.3)
Chloride: 97 mmol/L — ABNORMAL LOW (ref 98–111)
Creatinine, Ser: 0.8 mg/dL (ref 0.44–1.00)
GFR, Estimated: >60 mL/min (ref >60)
Glucose, Bld: 155 mg/dL — ABNORMAL HIGH (ref 70–99)
Potassium: 3.7 mmol/L (ref 3.5–5.1)
Sodium: 133 mmol/L — ABNORMAL LOW (ref 135–145)
Total Bilirubin: 0.5 mg/dL (ref <1.2)
Total Protein: 5.9 g/dL — ABNORMAL LOW (ref 6.5–8.1)

## 2023-02-03 MED ORDER — HEPARIN SOD (PORK) LOCK FLUSH 100 UNIT/ML IV SOLN
500.0000 [IU] | Freq: Once | INTRAVENOUS | Status: AC
  Administered 2023-02-03: 500 [IU] via INTRAVENOUS
  Filled 2023-02-03: qty 5

## 2023-02-03 MED ORDER — CIPROFLOXACIN HCL 500 MG PO TABS: 500.0000 mg | ORAL_TABLET | Freq: Every day | ORAL | 3 refills | Status: DC

## 2023-02-03 NOTE — Discharge Summary (Signed)
Physician Discharge Summary  Patient ID: Ariel [name] @ATTENDINGNPI @ MRN: [id number] DOB/AGE: 07-02-1955 67 y.o.  Admit date: 01/29/2023 Discharge date: 02/03/2023  Admission Diagnoses: Recurrent CNS non-Hodgkin's lymphoma  Discharge Diagnoses:  Principal Problem:   Lymphoma malignant, large cell (HCC)   Discharged Condition: good  Discharge Labs:   Significant Diagnostic Studies: microbiology: urine culture: negative  Consults: None  Disposition: Discharge disposition: 01-Home or Self Care       Treatments: Chemotherapy  Allergies as of 02/03/2023       Reactions   Decadron [dexamethasone] Other (See Comments)   Leg weakness Hallucinations   Plavix [clopidogrel] Hives, Swelling, Other (See Comments)   Lips became swollen   Prednisone Other (See Comments)   Steroids caused psychological issues  Hallucinations   Sulfa Antibiotics Itching, Rash   Codeine Nausea And Vomiting        Medication List     TAKE these medications    acetaminophen 325 MG tablet Commonly known as: TYLENOL Take 650 mg by mouth every 6 (six) hours as needed for headache, fever, moderate pain (pain score 4-6) or mild pain (pain score 1-3).   antiseptic oral rinse Liqd 15 mLs by Mouth Rinse route every 6 (six) hours. What changed:  when to take this reasons to take this   ciprofloxacin 500 MG tablet Commonly known as: CIPRO Take 1 tablet (500 mg total) by mouth daily with breakfast.   famciclovir 500 MG tablet Commonly known as: FAMVIR Take 500 mg by mouth daily.   fluconazole 100 MG tablet Commonly known as: DIFLUCAN Take 100 mg by mouth daily.   ondansetron 8 MG disintegrating tablet Commonly known as: ZOFRAN-ODT Take 1 tablet (8 mg total) by mouth every 8 (eight) hours as needed for nausea or vomiting. What changed: reasons to take this   sodium bicarbonate/sodium chloride Soln 15 Applications by Mouth Rinse route every 4 (four) hours.         Follow-up  Information     Ariel Macho, MD Follow up in 2 day(s).   Specialty: Oncology Why: Come to the office on Monday-02/05/2023 for labs and Neulasta injection Contact information: [address] [place] [address] [phone]                 Hospital Course:   [name] was admitted on 01/29/2023.  She admitted for third cycle of chemotherapy with high-dose methotrexate and Rituxan.  She she came in from the office.  She already had her Port-A-Cath accessed.  She was started on IV fluids.  She had an extra peripheral IV put in so we could start her on IV saline with bicarbonate..  She received Rituxan on day #1 of hospitalization.  She tolerated this well.  A day #2, her urine was alkalized.  We went ahead and gave her the high-dose methotrexate.  Again, she tolerated this well.  We then started leucovorin on day #3.  As always, Pharmacy did a fantastic job in helping [place] out.  They dose the leucovorin.  We got daily methotrexate levels.  Her methotrexate levels came down quite nicely.  On 6 December, her methotrexate level was 0.17.  As always, her LFTs increased with the high-dose methotrexate..  She did have some hypokalemia.  This is from the chemotherapy.  We went ahead and gave her supplemental potassium.  She did have some nausea and vomiting.  We got her on scheduled Zofran.  She had no diarrhea.  She had no mouth  sores.  She was using Biotene and sodium bicarb mouth rinse.  She had no headache.  There were no visual changes.  She had no rashes.  There was no bleeding.  When she came in, she had some bacteria in the urine.  We did check a urine culture on her.  This was negative.  She had no temperature while she was in the hospital.  Upon discharge, her sodium 133.  Potassium 3.7.  BUN 16 creatinine 0.8.  Her calcium was 9.4 with an albumin of 3.7.  SGPT was 167 SGOT 85.  Bilirubin 0.5.  Her white cell count was 4.5.  Hemoglobin 10.5.  Platelet  count 154,000.  Discharge Exam: Blood pressure 114/61, pulse 83, temperature 97.8 F (36.6 C), temperature source Oral, resp. rate 17, height 5\' 6"  (1.676 m), weight 156 lb (70.8 kg), SpO2 99%. On discharge, her physical exam showed no mucositis in the oral cavity.  She had no ocular or oral lesions.  She had no adenopathy in the neck.  Lungs were clear bilaterally.  She had good breath sounds bilaterally.  Cardiac exam regular rate and rhythm.  She had no murmurs.  Abdomen is soft.  Bowel sounds are present.  There is no fluid wave.  There is no palpable liver or spleen tip.  Extremity shows no clubbing, cyanosis or edema.  She had good range of motion of her joints.  She had good strength in upper lower extremities.  Neurological exam showed no focal neurological deficits.    SignedJosph [name] 02/03/2023, 7:00 AM

## 2023-02-03 NOTE — Plan of Care (Signed)

## 2023-02-05 ENCOUNTER — Inpatient Hospital Stay: Payer: Medicare Other

## 2023-02-05 ENCOUNTER — Telehealth: Payer: Self-pay

## 2023-02-05 VITALS — BP 145/71 | HR 87 | Temp 97.8°F | Resp 17

## 2023-02-05 DIAGNOSIS — C83398 Diffuse large b-cell lymphoma of other extranodal and solid organ sites: Secondary | ICD-10-CM | POA: Diagnosis not present

## 2023-02-05 DIAGNOSIS — Z79899 Other long term (current) drug therapy: Secondary | ICD-10-CM | POA: Diagnosis not present

## 2023-02-05 DIAGNOSIS — M1712 Unilateral primary osteoarthritis, left knee: Secondary | ICD-10-CM | POA: Diagnosis not present

## 2023-02-05 DIAGNOSIS — D509 Iron deficiency anemia, unspecified: Secondary | ICD-10-CM | POA: Diagnosis not present

## 2023-02-05 DIAGNOSIS — D500 Iron deficiency anemia secondary to blood loss (chronic): Secondary | ICD-10-CM

## 2023-02-05 DIAGNOSIS — C8510 Unspecified B-cell lymphoma, unspecified site: Secondary | ICD-10-CM

## 2023-02-05 DIAGNOSIS — C8332 Diffuse large B-cell lymphoma, intrathoracic lymph nodes: Secondary | ICD-10-CM

## 2023-02-05 LAB — METHOTREXATE
Methotrexate: 0.31
Methotrexate: 0.13
Methotrexate: 0.08

## 2023-02-05 MED ORDER — AMOXICILLIN 875 MG PO TABS: 875.0000 mg | ORAL_TABLET | Freq: Every day | ORAL | 0 refills | Status: AC

## 2023-02-05 MED ORDER — PEGFILGRASTIM-CBQV 6 MG/0.6ML ~~LOC~~ SOSY: 6.0000 mg | PREFILLED_SYRINGE | Freq: Once | SUBCUTANEOUS | Status: DC

## 2023-02-05 MED ORDER — PEGFILGRASTIM INJECTION 6 MG/0.6ML ~~LOC~~
6.0000 mg | PREFILLED_SYRINGE | Freq: Once | SUBCUTANEOUS | Status: AC
  Administered 2023-02-05: 6 mg via SUBCUTANEOUS
  Filled 2023-02-05: qty 0.6

## 2023-02-05 NOTE — Progress Notes (Signed)
Ok to switch [place] to [employer] per insurance preferred biosimilar.  Anola Gurney Petrey, [place], BCPS, BCOP 02/05/2023. 12:37 PM

## 2023-02-05 NOTE — Progress Notes (Signed)
Pt here for injection.  Recently d/c from hospital.  MD  gave pt rx for Cipro.  Pt states " it makes my legs hurt and I can't take it." Reviewed with MD, Cipro d/c'd.  New Rx for Augmentin 875 mg 1 PO Daily x 14 days sent to pt pharmacy.

## 2023-02-05 NOTE — Addendum Note (Signed)
Addended by: Neita Goodnight on: 02/05/2023 02:17 PM   Modules accepted: Orders

## 2023-02-05 NOTE — Patient Instructions (Signed)

## 2023-02-05 NOTE — Transitions of Care (Post Inpatient/ED Visit) (Signed)
   02/05/2023  Name: Ariel [name] MRN: [id number] DOB: 17-Nov-1955  Today's TOC FU Call Status: Today's TOC FU Call Status:: Unsuccessful Call (1st Attempt) Unsuccessful Call (1st Attempt) Date: 02/05/23  Attempted to reach the patient regarding the most recent Inpatient/ED visit.  Follow Up Plan: Additional outreach attempts will be made to reach the patient to complete the Transitions of Care (Post Inpatient/ED visit) call.   Kaynen Minner Daphine Deutscher BSN, RN RN Care Manager   Transitions of Care [facility] Direct Dial Number:  [phone]

## 2023-02-06 ENCOUNTER — Other Ambulatory Visit: Payer: Self-pay

## 2023-02-06 ENCOUNTER — Telehealth: Payer: Self-pay | Admitting: *Deleted

## 2023-02-06 NOTE — Transitions of Care (Post Inpatient/ED Visit) (Signed)
   02/06/2023  Name: Nirvi Laible MRN: [id number] DOB: February 16, 1956  Today's TOC FU Call Status: Today's TOC FU Call Status:: Unsuccessful Call (2nd Attempt) Unsuccessful Call (2nd Attempt) Date: 02/06/23  Attempted to reach the patient regarding the most recent Inpatient/ED visit.  Follow Up Plan: Additional outreach attempts will be made to reach the patient to complete the Transitions of Care (Post Inpatient/ED visit) call.   Gean Maidens BSN RN Population Health- Transition of Care Team.  [facility] [phone]

## 2023-02-06 NOTE — Transitions of Care (Post Inpatient/ED Visit) (Signed)
   02/06/2023  Name: Ariel [name] MRN: [id number] DOB: 10-09-1955  Today's TOC FU Call Status: Today's TOC FU Call Status:: Unsuccessful Call (3rd Attempt) Unsuccessful Call (3rd Attempt) Date: 02/06/23  Attempted to reach the patient regarding the most recent Inpatient/ED visit.  Follow Up Plan: No further outreach attempts will be made at this time. We have been unable to contact the patient.  Gean Maidens BSN RN Population Health- Transition of Care Team.  [facility] [phone]

## 2023-02-07 DIAGNOSIS — M1711 Unilateral primary osteoarthritis, right knee: Secondary | ICD-10-CM | POA: Diagnosis not present

## 2023-02-12 DIAGNOSIS — M1712 Unilateral primary osteoarthritis, left knee: Secondary | ICD-10-CM | POA: Diagnosis not present

## 2023-02-15 ENCOUNTER — Inpatient Hospital Stay (HOSPITAL_BASED_OUTPATIENT_CLINIC_OR_DEPARTMENT_OTHER): Payer: Medicare Other | Admitting: Hematology & Oncology

## 2023-02-15 ENCOUNTER — Inpatient Hospital Stay: Payer: Medicare Other

## 2023-02-15 ENCOUNTER — Telehealth: Payer: Self-pay

## 2023-02-15 ENCOUNTER — Encounter (HOSPITAL_COMMUNITY): Payer: Self-pay

## 2023-02-15 ENCOUNTER — Encounter: Payer: Self-pay | Admitting: Hematology & Oncology

## 2023-02-15 VITALS — BP 136/71 | HR 102 | Temp 100.0°F | Resp 20 | Ht 66.0 in | Wt 156.0 lb

## 2023-02-15 DIAGNOSIS — C83398 Diffuse large b-cell lymphoma of other extranodal and solid organ sites: Secondary | ICD-10-CM | POA: Diagnosis not present

## 2023-02-15 DIAGNOSIS — C8330 Diffuse large B-cell lymphoma, unspecified site: Secondary | ICD-10-CM | POA: Diagnosis not present

## 2023-02-15 DIAGNOSIS — Z79899 Other long term (current) drug therapy: Secondary | ICD-10-CM | POA: Diagnosis not present

## 2023-02-15 DIAGNOSIS — C8332 Diffuse large B-cell lymphoma, intrathoracic lymph nodes: Secondary | ICD-10-CM

## 2023-02-15 DIAGNOSIS — Z95828 Presence of other vascular implants and grafts: Secondary | ICD-10-CM

## 2023-02-15 DIAGNOSIS — D509 Iron deficiency anemia, unspecified: Secondary | ICD-10-CM | POA: Diagnosis not present

## 2023-02-15 DIAGNOSIS — C8510 Unspecified B-cell lymphoma, unspecified site: Secondary | ICD-10-CM

## 2023-02-15 LAB — CBC WITH DIFFERENTIAL (CANCER CENTER ONLY)
Abs Immature Granulocytes: 0.07 10*3/uL (ref 0.00–0.07)
Basophils Absolute: 0 10*3/uL (ref 0.0–0.1)
Basophils Relative: 0 %
Eosinophils Absolute: 0.1 10*3/uL (ref 0.0–0.5)
Eosinophils Relative: 1 %
HCT: 35.5 % — ABNORMAL LOW (ref 36.0–46.0)
Hemoglobin: 12.2 g/dL (ref 12.0–15.0)
Immature Granulocytes: 1 %
Lymphocytes Relative: 21 %
Lymphs Abs: 1.8 10*3/uL (ref 0.7–4.0)
MCH: 33.6 pg (ref 26.0–34.0)
MCHC: 34.4 g/dL (ref 30.0–36.0)
MCV: 97.8 fL (ref 80.0–100.0)
Monocytes Absolute: 0.7 10*3/uL (ref 0.1–1.0)
Monocytes Relative: 8 %
Neutro Abs: 5.9 10*3/uL (ref 1.7–7.7)
Neutrophils Relative %: 69 %
Platelet Count: 183 10*3/uL (ref 150–400)
RBC: 3.63 MIL/uL — ABNORMAL LOW (ref 3.87–5.11)
RDW: 13.1 % (ref 11.5–15.5)
WBC Count: 8.6 10*3/uL (ref 4.0–10.5)
nRBC: 0 % (ref 0.0–0.2)

## 2023-02-15 LAB — CMP (CANCER CENTER ONLY)
ALT: 19 U/L (ref 0–44)
AST: 14 U/L — ABNORMAL LOW (ref 15–41)
Albumin: 4.7 g/dL (ref 3.5–5.0)
Alkaline Phosphatase: 90 U/L (ref 38–126)
Anion gap: 8 (ref 5–15)
BUN: 20 mg/dL (ref 8–23)
CO2: 28 mmol/L (ref 22–32)
Calcium: 10.5 mg/dL — ABNORMAL HIGH (ref 8.9–10.3)
Chloride: 102 mmol/L (ref 98–111)
Creatinine: 0.77 mg/dL (ref 0.44–1.00)
GFR, Estimated: >60 mL/min (ref >60)
Glucose, Bld: 102 mg/dL — ABNORMAL HIGH (ref 70–99)
Potassium: 4.2 mmol/L (ref 3.5–5.1)
Sodium: 138 mmol/L (ref 135–145)
Total Bilirubin: 0.3 mg/dL (ref <1.2)
Total Protein: 6.7 g/dL (ref 6.5–8.1)

## 2023-02-15 LAB — LACTATE DEHYDROGENASE: LDH: 160 U/L (ref 98–192)

## 2023-02-15 MED ORDER — HEPARIN SOD (PORK) LOCK FLUSH 100 UNIT/ML IV SOLN
500.0000 [IU] | Freq: Once | INTRAVENOUS | Status: AC
Start: 2023-02-15 — End: 2023-02-15
  Administered 2023-02-15: 500 [IU] via INTRAVENOUS

## 2023-02-15 MED ORDER — SODIUM CHLORIDE 0.9% FLUSH
10.0000 mL | Freq: Once | INTRAVENOUS | Status: AC
Start: 2023-02-15 — End: 2023-02-15
  Administered 2023-02-15: 10 mL via INTRAVENOUS

## 2023-02-15 NOTE — Telephone Encounter (Signed)
Per Dr Myna Hidalgo, spoke with [facility] in bed placement to reserve a bed for inpatient chemo on 03/05/2023. Email sent to inpatient chemo group. Dr Myna Hidalgo notified patient during today's office visit. dph

## 2023-02-15 NOTE — Progress Notes (Signed)
Hematology and Oncology Follow Up Visit  Ariel [name] [id number] [birth year] 67 y.o. 02/15/2023   Principle Diagnosis:  Diffuse large cell non-Hodgkin's lymphoma-CNS involvement -- relapsed   Past Therapy: Status post chemotherapy with R-ICE/R-MTV --  s/p cycle #4 MATRIX -- s/p cycle #4 -- start on 05/02/2021 --completed on 08/06/2021 Neulasta 6 mg subcu post chemotherapy MTV- s/p cycle #1 -- given on 01/22/204   Current Therapy:        Imbruvica/Rituxan -- started 05/02/2022, s/p cycle #5 -DC on 07/16/2022 --DC on 10/29/2022 High-dose methotrexate/Rituxan-cycle #3  -- started on 11/03/2022   Interim History:  Ariel [name] is here today for follow-up.  She really looks fantastic.  She can ready to go down to [place] tomorrow to see her daughter and 2 grandchildren.  She really is looking forward to this..  She really tolerated her third cycle of chemotherapy incredibly well.  She metabolized the methotrexate quite quickly.  She really has had no problems with infection.  She does get Neulasta after chemotherapy.  We do have her on antibiotic and antifungal antiviral prophylactic.  She is eating well.  She is having no problems with bowels or bladder.  There is no mouth sores.  She has had no diarrhea.  She has had no rashes.  There is been no bleeding.  She has had no headache.  Thankfully, there is been no fever.  Overall, I would say that her performance status is ECOG 1.     Medications:  Allergies as of 02/15/2023       Reactions   Decadron [dexamethasone] Other (See Comments)   Leg weakness Hallucinations   Plavix [clopidogrel] Hives, Swelling, Other (See Comments)   Lips became swollen   Prednisone Other (See Comments)   Steroids caused psychological issues  Hallucinations   Sulfa Antibiotics Itching, Rash   Codeine Nausea And Vomiting        Medication List        Accurate as of February 15, 2023  8:25 AM. If you have any questions, ask your nurse or doctor.           acetaminophen 325 MG tablet Commonly known as: TYLENOL Take 650 mg by mouth every 6 (six) hours as needed for headache, fever, moderate pain (pain score 4-6) or mild pain (pain score 1-3).   amoxicillin 875 MG tablet Commonly known as: AMOXIL Take 1 tablet (875 mg total) by mouth daily for 14 days.   antiseptic oral rinse Liqd 15 mLs by Mouth Rinse route every 6 (six) hours. What changed:  when to take this reasons to take this   famciclovir 500 MG tablet Commonly known as: FAMVIR Take 500 mg by mouth daily.   fluconazole 100 MG tablet Commonly known as: DIFLUCAN Take 100 mg by mouth daily.   ondansetron 8 MG disintegrating tablet Commonly known as: ZOFRAN-ODT Take 1 tablet (8 mg total) by mouth every 8 (eight) hours as needed for nausea or vomiting.   sodium bicarbonate/sodium chloride Soln 15 Applications by Mouth Rinse route every 4 (four) hours.        Allergies:  Allergies  Allergen Reactions   Decadron [Dexamethasone] Other (See Comments)    Leg weakness Hallucinations   Plavix [Clopidogrel] Hives, Swelling and Other (See Comments)    Lips became swollen   Prednisone Other (See Comments)    Steroids caused psychological issues  Hallucinations   Sulfa Antibiotics Itching and Rash   Codeine Nausea And Vomiting    Past Medical History, Surgical  history, Social history, and Family History were reviewed and updated.  Review of Systems: Review of Systems  Constitutional: Negative.   HENT: Negative.    Eyes: Negative.   Respiratory: Negative.    Cardiovascular: Negative.   Gastrointestinal: Negative.   Genitourinary: Negative.   Musculoskeletal: Negative.   Skin: Negative.   Neurological: Negative.   Endo/Heme/Allergies: Negative.   Psychiatric/Behavioral: Negative.       Physical Exam:  Vital signs are temperature 100.  Pulse 102.  Blood pressure 136/71.  Weight is 156 pounds.    Wt Readings from Last 3 Encounters:  02/15/23 156 lb  (70.8 kg)  01/30/23 156 lb (70.8 kg)  01/29/23 156 lb (70.8 kg)    Physical Activity: Sufficiently Active (09/05/2022)   Exercise Vital Sign    Days of Exercise per Week: 6 days    Minutes of Exercise per Session: 40 min   Physical Exam Vitals reviewed.  HENT:     Head: Normocephalic and atraumatic.  Eyes:     Pupils: Pupils are equal, round, and reactive to light.  Cardiovascular:     Rate and Rhythm: Normal rate and regular rhythm.     Heart sounds: Normal heart sounds.  Pulmonary:     Effort: Pulmonary effort is normal.     Breath sounds: Normal breath sounds.  Abdominal:     General: Bowel sounds are normal.     Palpations: Abdomen is soft.  Musculoskeletal:        General: No tenderness or deformity. Normal range of motion.     Cervical back: Normal range of motion.  Lymphadenopathy:     Cervical: No cervical adenopathy.  Skin:    General: Skin is warm and dry.     Findings: No erythema or rash.  Neurological:     Mental Status: She is alert and oriented to person, place, and time.  Psychiatric:        Behavior: Behavior normal.        Thought Content: Thought content normal.        Judgment: Judgment normal.      Lab Results  Component Value Date   WBC 4.5 02/03/2023   HGB 10.5 (L) 02/03/2023   HCT 30.3 (L) 02/03/2023   MCV 98.7 02/03/2023   PLT 154 02/03/2023   Lab Results  Component Value Date   FERRITIN 944 (H) 10/16/2022   IRON 61 10/16/2022   TIBC 277 10/16/2022   UIBC 216 10/16/2022   IRONPCTSAT 22 10/16/2022   Lab Results  Component Value Date   RETICCTPCT 2.4 11/15/2021   RBC 3.07 (L) 02/03/2023   No results found for: "KPAFRELGTCHN", "LAMBDASER", "KAPLAMBRATIO" No results found for: "IGGSERUM", "IGA", "IGMSERUM" No results found for: "TOTALPROTELP", "ALBUMINELP", "A1GS", "A2GS", "BETS", "BETA2SER", "GAMS", "MSPIKE", "SPEI"   Chemistry      Component Value Date/Time   NA 133 (L) 02/03/2023 0609   K 3.7 02/03/2023 0609   CL 97 (L)  02/03/2023 0609   CO2 28 02/03/2023 0609   BUN 16 02/03/2023 0609   CREATININE 0.80 02/03/2023 0609   CREATININE 0.81 01/29/2023 0819   CREATININE 0.68 11/18/2012 1439      Component Value Date/Time   CALCIUM 9.4 02/03/2023 0609   ALKPHOS 43 02/03/2023 0609   AST 85 (H) 02/03/2023 0609   AST 15 01/29/2023 0819   ALT 167 (H) 02/03/2023 0609   ALT 16 01/29/2023 0819   BILITOT 0.5 02/03/2023 0609   BILITOT 0.4 01/29/2023 1610  Impression and Plan: Ariel [name] is a very pleasant 67 yo caucasian female with iron deficiency anemia and then was diagnosed with diffuse large cell non-Hodgkin's lymphoma that had traveled to her brain.  She  has had another relapse.  She had been on Rituxan/IMBRUVICA.  We went and stopped this.  We now have her on Rituxan/high-dose of methotrexate.  She is responding by the past MRI.  She will be down to [place].  I know she will have a wonderful time down in [place].  When she comes back, we will then plan admit her after January 1.  After this next cycle of treatment, we will then plan for another MRI.  I will continue to talk to her about CAR-T.  I may have one of my  patients talk to her.  I really believe that she we will need CAR-T therapy.  I will just plan to see her back when she is in the hospital.   .    [name], MD 12/19/20248:25 AM

## 2023-02-15 NOTE — Patient Instructions (Signed)

## 2023-03-05 ENCOUNTER — Other Ambulatory Visit: Payer: Self-pay | Admitting: Hematology & Oncology

## 2023-03-05 ENCOUNTER — Inpatient Hospital Stay (HOSPITAL_COMMUNITY): Payer: Medicare Other

## 2023-03-05 ENCOUNTER — Other Ambulatory Visit: Payer: Self-pay

## 2023-03-05 ENCOUNTER — Inpatient Hospital Stay (HOSPITAL_COMMUNITY)
Admission: AD | Admit: 2023-03-05 | Discharge: 2023-03-10 | DRG: 847 | Disposition: A | Discharge status: Home / Self Care | Payer: Medicare Other | Source: Ambulatory Visit | Attending: Hematology & Oncology | Admitting: Hematology & Oncology

## 2023-03-05 ENCOUNTER — Encounter (HOSPITAL_COMMUNITY): Payer: Self-pay | Admitting: Hematology & Oncology

## 2023-03-05 DIAGNOSIS — E7800 Pure hypercholesterolemia, unspecified: Secondary | ICD-10-CM | POA: Diagnosis not present

## 2023-03-05 DIAGNOSIS — Z833 Family history of diabetes mellitus: Secondary | ICD-10-CM | POA: Diagnosis not present

## 2023-03-05 DIAGNOSIS — Z825 Family history of asthma and other chronic lower respiratory diseases: Secondary | ICD-10-CM | POA: Diagnosis not present

## 2023-03-05 DIAGNOSIS — E876 Hypokalemia: Secondary | ICD-10-CM | POA: Diagnosis present

## 2023-03-05 DIAGNOSIS — Z96643 Presence of artificial hip joint, bilateral: Secondary | ICD-10-CM | POA: Diagnosis not present

## 2023-03-05 DIAGNOSIS — Z5111 Encounter for antineoplastic chemotherapy: Secondary | ICD-10-CM | POA: Diagnosis not present

## 2023-03-05 DIAGNOSIS — C8580 Other specified types of non-Hodgkin lymphoma, unspecified site: Secondary | ICD-10-CM | POA: Diagnosis present

## 2023-03-05 DIAGNOSIS — Z803 Family history of malignant neoplasm of breast: Secondary | ICD-10-CM | POA: Diagnosis not present

## 2023-03-05 DIAGNOSIS — C8332 Diffuse large B-cell lymphoma, intrathoracic lymph nodes: Principal | ICD-10-CM

## 2023-03-05 DIAGNOSIS — Z885 Allergy status to narcotic agent status: Secondary | ICD-10-CM

## 2023-03-05 DIAGNOSIS — I700 Atherosclerosis of aorta: Secondary | ICD-10-CM | POA: Diagnosis not present

## 2023-03-05 DIAGNOSIS — C8339 Primary central nervous system lymphoma: Secondary | ICD-10-CM

## 2023-03-05 DIAGNOSIS — C8333 Diffuse large B-cell lymphoma, intra-abdominal lymph nodes: Secondary | ICD-10-CM

## 2023-03-05 DIAGNOSIS — Z8673 Personal history of transient ischemic attack (TIA), and cerebral infarction without residual deficits: Secondary | ICD-10-CM

## 2023-03-05 DIAGNOSIS — Z888 Allergy status to other drugs, medicaments and biological substances status: Secondary | ICD-10-CM

## 2023-03-05 DIAGNOSIS — C7931 Secondary malignant neoplasm of brain: Secondary | ICD-10-CM | POA: Diagnosis not present

## 2023-03-05 DIAGNOSIS — Z882 Allergy status to sulfonamides status: Secondary | ICD-10-CM | POA: Diagnosis not present

## 2023-03-05 DIAGNOSIS — Z8249 Family history of ischemic heart disease and other diseases of the circulatory system: Secondary | ICD-10-CM | POA: Diagnosis not present

## 2023-03-05 DIAGNOSIS — C8590 Non-Hodgkin lymphoma, unspecified, unspecified site: Secondary | ICD-10-CM | POA: Diagnosis not present

## 2023-03-05 DIAGNOSIS — D509 Iron deficiency anemia, unspecified: Secondary | ICD-10-CM | POA: Diagnosis present

## 2023-03-05 DIAGNOSIS — Z85820 Personal history of malignant melanoma of skin: Secondary | ICD-10-CM

## 2023-03-05 DIAGNOSIS — C8330 Diffuse large B-cell lymphoma, unspecified site: Secondary | ICD-10-CM

## 2023-03-05 DIAGNOSIS — Z5181 Encounter for therapeutic drug level monitoring: Secondary | ICD-10-CM | POA: Diagnosis not present

## 2023-03-05 DIAGNOSIS — C8510 Unspecified B-cell lymphoma, unspecified site: Secondary | ICD-10-CM

## 2023-03-05 DIAGNOSIS — C8331 Diffuse large B-cell lymphoma, lymph nodes of head, face, and neck: Secondary | ICD-10-CM | POA: Diagnosis not present

## 2023-03-05 LAB — COMPREHENSIVE METABOLIC PANEL
ALT: 18 U/L (ref 0–44)
AST: 19 U/L (ref 15–41)
Albumin: 4.2 g/dL (ref 3.5–5.0)
Alkaline Phosphatase: 55 U/L (ref 38–126)
Anion gap: 7 (ref 5–15)
BUN: 16 mg/dL (ref 8–23)
CO2: 26 mmol/L (ref 22–32)
Calcium: 9.4 mg/dL (ref 8.9–10.3)
Chloride: 104 mmol/L (ref 98–111)
Creatinine, Ser: 0.58 mg/dL (ref 0.44–1.00)
GFR, Estimated: >60 mL/min (ref >60)
Glucose, Bld: 140 mg/dL — ABNORMAL HIGH (ref 70–99)
Potassium: 3.8 mmol/L (ref 3.5–5.1)
Sodium: 137 mmol/L (ref 135–145)
Total Bilirubin: 0.3 mg/dL (ref 0.0–1.2)
Total Protein: 6.2 g/dL — ABNORMAL LOW (ref 6.5–8.1)

## 2023-03-05 LAB — CBC WITH DIFFERENTIAL/PLATELET
Abs Immature Granulocytes: 0.02 10*3/uL (ref 0.00–0.07)
Basophils Absolute: 0 10*3/uL (ref 0.0–0.1)
Basophils Relative: 0 %
Eosinophils Absolute: 0.1 10*3/uL (ref 0.0–0.5)
Eosinophils Relative: 2 %
HCT: 34.5 % — ABNORMAL LOW (ref 36.0–46.0)
Hemoglobin: 11.9 g/dL — ABNORMAL LOW (ref 12.0–15.0)
Immature Granulocytes: 0 %
Lymphocytes Relative: 25 %
Lymphs Abs: 1.3 10*3/uL (ref 0.7–4.0)
MCH: 34.3 pg — ABNORMAL HIGH (ref 26.0–34.0)
MCHC: 34.5 g/dL (ref 30.0–36.0)
MCV: 99.4 fL (ref 80.0–100.0)
Monocytes Absolute: 0.5 10*3/uL (ref 0.1–1.0)
Monocytes Relative: 10 %
Neutro Abs: 3.2 10*3/uL (ref 1.7–7.7)
Neutrophils Relative %: 63 %
Platelets: 211 10*3/uL (ref 150–400)
RBC: 3.47 MIL/uL — ABNORMAL LOW (ref 3.87–5.11)
RDW: 12.8 % (ref 11.5–15.5)
WBC: 5.1 10*3/uL (ref 4.0–10.5)
nRBC: 0 % (ref 0.0–0.2)

## 2023-03-05 LAB — URINALYSIS, COMPLETE (UACMP) WITH MICROSCOPIC
Bacteria, UA: NONE SEEN
Bilirubin Urine: NEGATIVE
Glucose, UA: NEGATIVE mg/dL
Hgb urine dipstick: NEGATIVE
Ketones, ur: NEGATIVE mg/dL
Leukocytes,Ua: NEGATIVE
Nitrite: NEGATIVE
Protein, ur: NEGATIVE mg/dL
Specific Gravity, Urine: 1.015 (ref 1.005–1.030)
pH: 6 (ref 5.0–8.0)

## 2023-03-05 LAB — MAGNESIUM: Magnesium: 2.1 mg/dL (ref 1.7–2.4)

## 2023-03-05 MED ORDER — HYDROCORTISONE (PERIANAL) 2.5 % EX CREA: 1.0000 | TOPICAL_CREAM | Freq: Two times a day (BID) | CUTANEOUS | Status: DC | PRN

## 2023-03-05 MED ORDER — ONDANSETRON HCL 4 MG PO TABS: 4.0000 mg | ORAL_TABLET | Freq: Three times a day (TID) | ORAL | Status: DC | PRN

## 2023-03-05 MED ORDER — DIPHENHYDRAMINE HCL 25 MG PO CAPS: 25.0000 mg | ORAL_CAPSULE | Freq: Once | ORAL | Status: DC

## 2023-03-05 MED ORDER — DIPHENHYDRAMINE HCL 25 MG PO CAPS
50.0000 mg | ORAL_CAPSULE | Freq: Once | ORAL | Status: AC
  Administered 2023-03-05: 50 mg via ORAL
  Filled 2023-03-05: qty 2

## 2023-03-05 MED ORDER — SODIUM CHLORIDE 0.9 % IV SOLN: 500.0000 mg/m2 | Freq: Once | INTRAVENOUS | Status: DC

## 2023-03-05 MED ORDER — ACETAMINOPHEN 325 MG PO TABS: 650.0000 mg | ORAL_TABLET | Freq: Once | ORAL | Status: DC

## 2023-03-05 MED ORDER — SODIUM BICARBONATE/SODIUM CHLORIDE MOUTHWASH
Freq: Four times a day (QID) | OROMUCOSAL | Status: DC
Start: 2023-03-05 — End: 2023-03-10
  Administered 2023-03-08 – 2023-03-09 (×2): 1 via OROMUCOSAL
  Filled 2023-03-05: qty 1000

## 2023-03-05 MED ORDER — ALUM & MAG HYDROXIDE-SIMETH 200-200-20 MG/5ML PO SUSP: 60.0000 mL | ORAL | Status: DC | PRN

## 2023-03-05 MED ORDER — ONDANSETRON 4 MG PO TBDP: 4.0000 mg | ORAL_TABLET | Freq: Three times a day (TID) | ORAL | Status: DC | PRN

## 2023-03-05 MED ORDER — SODIUM BICARBONATE 8.4 % IV SOLN
INTRAVENOUS | Status: DC
  Filled 2023-03-05 (×4): qty 150
  Filled 2023-03-05: qty 1000
  Filled 2023-03-05: qty 150
  Filled 2023-03-05: qty 1000
  Filled 2023-03-05 (×2): qty 150
  Filled 2023-03-05: qty 1000
  Filled 2023-03-05 (×2): qty 150
  Filled 2023-03-05 (×2): qty 1000

## 2023-03-05 MED ORDER — CHLORHEXIDINE GLUCONATE CLOTH 2 % EX PADS
6.0000 | MEDICATED_PAD | Freq: Every day | CUTANEOUS | Status: DC
  Administered 2023-03-06 – 2023-03-09 (×4): 6 via TOPICAL

## 2023-03-05 MED ORDER — SODIUM CHLORIDE 0.9 % IV SOLN: INTRAVENOUS | Status: AC

## 2023-03-05 MED ORDER — SODIUM CHLORIDE 0.9 % IV SOLN
INTRAVENOUS | Status: DC
Start: 2023-03-05 — End: 2023-03-05

## 2023-03-05 MED ORDER — ENOXAPARIN SODIUM 40 MG/0.4ML IJ SOSY
40.0000 mg | PREFILLED_SYRINGE | INTRAMUSCULAR | Status: DC
  Administered 2023-03-05 – 2023-03-06 (×2): 40 mg via SUBCUTANEOUS
  Filled 2023-03-05 (×2): qty 0.4

## 2023-03-05 MED ORDER — ACETAMINOPHEN 325 MG PO TABS
650.0000 mg | ORAL_TABLET | ORAL | Status: DC | PRN
  Administered 2023-03-06 – 2023-03-08 (×4): 650 mg via ORAL
  Filled 2023-03-05 (×4): qty 2

## 2023-03-05 MED ORDER — OLANZAPINE 5 MG PO TABS
5.0000 mg | ORAL_TABLET | Freq: Every day | ORAL | Status: DC
  Filled 2023-03-05 (×3): qty 1

## 2023-03-05 MED ORDER — BIOTENE DRY MOUTH MT LIQD
15.0000 mL | Freq: Four times a day (QID) | OROMUCOSAL | Status: DC
  Administered 2023-03-05 – 2023-03-10 (×16): 15 mL via OROMUCOSAL

## 2023-03-05 MED ORDER — ONDANSETRON HCL 4 MG/2ML IJ SOLN
4.0000 mg | Freq: Three times a day (TID) | INTRAMUSCULAR | Status: DC | PRN
  Administered 2023-03-06 – 2023-03-07 (×2): 4 mg via INTRAVENOUS
  Filled 2023-03-05 (×3): qty 2

## 2023-03-05 MED ORDER — GUAIFENESIN-DM 100-10 MG/5ML PO SYRP: 10.0000 mL | ORAL_SOLUTION | ORAL | Status: DC | PRN

## 2023-03-05 MED ORDER — SODIUM CHLORIDE 0.9 % IV SOLN: 8.0000 mg | Freq: Three times a day (TID) | INTRAVENOUS | Status: DC | PRN

## 2023-03-05 MED ORDER — SODIUM CHLORIDE 0.9 % IV SOLN
500.0000 mg/m2 | Freq: Once | INTRAVENOUS | Status: AC
  Administered 2023-03-05: 900 mg via INTRAVENOUS
  Filled 2023-03-05: qty 50

## 2023-03-05 MED ORDER — ACETAMINOPHEN 325 MG PO TABS
650.0000 mg | ORAL_TABLET | Freq: Once | ORAL | Status: AC
  Administered 2023-03-05: 650 mg via ORAL
  Filled 2023-03-05: qty 2

## 2023-03-05 MED ORDER — SENNOSIDES-DOCUSATE SODIUM 8.6-50 MG PO TABS: 1.0000 | ORAL_TABLET | Freq: Every evening | ORAL | Status: DC | PRN

## 2023-03-05 NOTE — H&P (Addendum)
 [facility] CONSULT NOTE  Patient Care Team: Daryl Setter, NP as PCP - General (Internal Medicine) Genette Charlie GAILS, MD (Obstetrics and Gynecology) Tat, Asberry RAMAN, DO as Consulting Physician (Neurology) Timmy Maude SAUNDERS, MD as Medical Oncologist (Oncology)  CHIEF COMPLAINT:  Recurrent lymphoma, for inpatient chemotherapy   REFERRING PHYSICIAN:  HISTORY OF PRESENTING ILLNESS:  Ariel [name] 68 y.o. female with history of recurrent lymphoma of the brain.  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  Patient has had prior chemotherapy with rituximab  and high-dose methotrexate  for which she responded very well.  Patient is now being admitted for fourth cycle of chemotherapy under the management of Dr. Timmy. Patient assessed and examined today, sitting up in chair at bedside.  Reports that she feels well.  Denies visual disturbances, headaches, mouth sores, nausea, vomiting, abnormal GI symptoms, abnormal bleeding.  Reports that she feels good.  Oncologic medical history is significant for melanoma diagnosed in 2021 stage Ib.  Then in 2022 patient was diagnosed with high-grade B cell lymphoma for which she started chemotherapy and continued in 2024. Surgical history includes 2 hip replacement surgeries, first 28 September 2022 and second 28 November 2022.  She also reports having cataract surgery. Family history significant for sister with breast cancer.  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  Denies alcohol use, tobacco use, illicit drug use.  Patient reports healthy lifestyle that she is independent of ADLs, exercises regularly by walking and doing yoga.  Patient does lament that she cannot drive at this time.  She is a very pleasant lady.   I have reviewed her chart and materials related to her cancer extensively and collaborated history with the patient. Summary of oncologic history is as  follows: Oncology History  Melanoma (HCC)  2021 Initial Diagnosis   Melanoma (HCC)   12/01/2020 Cancer Staging   Staging form: Melanoma of the Skin, AJCC 8th Edition - Clinical stage from 12/01/2020: Stage IB (cT1b, cN0, cM0) - Signed by Timmy Maude SAUNDERS, MD on 12/01/2020   High grade B-cell lymphoma (HCC)  12/22/2020 Initial Diagnosis   High grade B-cell lymphoma (HCC)   01/04/2021 - 01/10/2021 Chemotherapy   Patient is on Treatment Plan : NON-HODGKINS LYMPHOMA R-ICE q21d     01/25/2021 - 02/02/2021 Chemotherapy   Patient is on Treatment Plan : IP NON-HODGKINS LYMPHOMA R-MTV q21d     03/03/2021 - 03/03/2021 Chemotherapy   Patient is on Treatment Plan : IP NON-HODGKINS LYMPHOMA RICE q21d     03/08/2021 - 03/14/2021 Chemotherapy   Patient is on Treatment Plan : IP NON-HODGKINS LYMPHOMA RICE q21d     04/04/2021 - 08/05/2021 Chemotherapy   Patient is on Treatment Plan : IP R-MTV x 1 then IP MATRix q21d starting 05/02/21     03/21/2022 - 03/28/2022 Chemotherapy   Patient is on Treatment Plan : IP NON-HODGKINS LYMPHOMA R-MTV q21d     05/03/2022 - 06/29/2022 Chemotherapy   Patient is on Treatment Plan : ITP Rituximab  q7d x 4 cycles     11/03/2022 -  Chemotherapy   Patient is on Treatment Plan : IP NON-HODGKINS LYMPHOMA High Dose Methotrexate  + Leucovorin  Rescue     Diffuse large B cell lymphoma (HCC)  03/07/2021 Initial Diagnosis   Diffuse large B cell lymphoma (HCC)   04/04/2021 - 08/05/2021 Chemotherapy   Patient is on Treatment Plan : IP R-MTV x 1 then IP MATRix q21d starting 05/02/21  03/21/2022 - 03/28/2022 Chemotherapy   Patient is on Treatment Plan : IP NON-HODGKINS LYMPHOMA R-MTV q21d     05/03/2022 - 06/29/2022 Chemotherapy   Patient is on Treatment Plan : ITP Rituximab  q7d x 4 cycles     11/03/2022 -  Chemotherapy   Patient is on Treatment Plan : IP NON-HODGKINS LYMPHOMA High Dose Methotrexate  + Leucovorin  Rescue       ASSESSMENT & PLAN:  1.  Recurrent high-grade B-cell lymphoma - Patient is  now admitted for fourth cycle of Rituxan /high-dose methotrexate . - Initially diagnosed 2022 - Patient last received third cycle of Rituxan  with high-dose methotrexate  during last hospitalization on 01/29/23. - Patient is now readmitted for C5 IP Non-Hodgkin's lymphoma high-dose methotrexate  + leucovorin  rescue. Today is day 1 of treatment.  Patient is expected to be hospitalized approximately 5 days. - Must monitor urine pH daily, urine pH to remain above 8. - Medical oncology/Dr. Timmy following closely   2.  History of melanoma, right hip - Initially diagnosed 2021, stage Ib.  Patient states that she a mole surgically removed from her right hip in 2021. - Medical oncology/Dr. Timmy following closely   3.  History of iron deficiency anemia - Monitor CBC with differential closely - Will continue to monitor iron levels as outpatient - Dr. Timmy following closely   Orders Placed This Encounter  Procedures   X-ray chest PA and lateral    Standing Status:   Standing    Number of Occurrences:   1    Symptom/Reason for Exam:   Lymphoma malignant, large cell (HCC) [[id number]]   CBC    Baseline for enoxaparin  therapy IF NOT ALREADY DRAWN.  Notify MD if PLT < 100 K.    Standing Status:   Standing    Number of Occurrences:   1   Creatinine, serum    while on enoxaparin  therapy    Standing Status:   Standing    Number of Occurrences:   3   Comprehensive metabolic panel    Standing Status:   Standing    Number of Occurrences:   1   Magnesium     Standing Status:   Standing    Number of Occurrences:   1   CBC with Differential/Platelet    Standing Status:   Standing    Number of Occurrences:   1   Urinalysis, Complete w Microscopic -Urine, Clean Catch    Standing Status:   Standing    Number of Occurrences:   1    Specimen Source:   Urine, Clean Catch [[id number]]   Diet regular Room service appropriate? Yes; Fluid consistency: Thin    Standing Status:   Standing    Number of Occurrences:    1    Room service appropriate?:   Yes    Fluid consistency::   Thin   Vital signs    Standing Status:   Standing    Number of Occurrences:   1   Notify physician (specify)    Standing Status:   Standing    Number of Occurrences:   1    Notify Physician:   for pulse less than 55 or greater than 120    Notify Physician:   for respiratory rate less than 12 or greater than 25    Notify Physician:   for temperature greater than 100.5 F    Notify Physician:   for urinary output less than 30 mL/kg/hr for four hours,    Notify Physician:   for  systolic BP less than 90 or greater than 160    Notify Physician:   for diastolic BP less than 60 or greater than 100   Mobility Protocol: No Restrictions    RN to initiate protocols based on patient's level of care    Standing Status:   Standing    Number of Occurrences:   1   Refer to Sidebar Report Refer to ICU, Med-Surg, Progressive, and Step-Down Mobility Protocol Sidebars    Refer to ICU, Med-Surg, Progressive, and Step-Down Mobility Protocol Sidebars    Standing Status:   Standing    Number of Occurrences:   1   Apply Oncology General Admission Care Plan    Standing Status:   Standing    Number of Occurrences:   1   Initiate Oral Care Protocol    Standing Status:   Standing    Number of Occurrences:   1   Initiate Carrier Fluid Protocol    Standing Status:   Standing    Number of Occurrences:   1   Oncology -  Continue treatment plan    Standing Status:   Standing    Number of Occurrences:   1   Full code    Standing Status:   Standing    Number of Occurrences:   1    By::   Consent: discussion documented in EHR   pharmacy consult    Standing Status:   Standing    Number of Occurrences:   1    Reason for Consult (*indicate specifics in comment field):   Other (see comment)    Comment::   I need start maintenance IV fluid with bicarb for her high-dose methotrexate .   EKG 12-Lead    For chest pain    Standing Status:   Standing     Number of Occurrences:   20   Admit to Inpatient (patient's expected length of stay will be greater than 2 midnights or inpatient only procedure)    Standing Status:   Standing    Number of Occurrences:   1    Hospital Area:   [facility] [[id number]]    Level of Care:   Med-Surg [16]    Covid Evaluation:   Asymptomatic - no recent exposure (last 10 days) testing not required    Diagnosis:   Lymphoma malignant, large cell [facility]) [[id number]]    Admitting Physician:   TIMMY MAUDE JONELLE JERALINE    Attending Physician:   TIMMY MAUDE JONELLE [[id number]]    Certification::   I certify this patient will need inpatient services for at least 2 midnights    Expected Medical Readiness:   03/10/2023     MEDICAL HISTORY:  Past Medical History:  Diagnosis Date   Allergy    Arthritis    Cataract    Chicken pox    Complication of anesthesia    Per patient, very slow to wake up after anesthesia   Goals of care, counseling/discussion 12/22/2020   High cholesterol    High grade B-cell lymphoma (HCC) 12/22/2020   Hyperglycemia 11/10/2020   Melanoma (HCC) 2021   right hip   Melanoma (HCC) 10/28/2020   pt stated brain liver and bladder   PONV (postoperative nausea and vomiting)    Stroke [facility])    Urinary incontinence     SURGICAL HISTORY: Past Surgical History:  Procedure Laterality Date   AIR/FLUID EXCHANGE Right 12/07/2020   Procedure: AIR/FLUID EXCHANGE;  Surgeon: Tobie Baptist, MD;  Location: [facility]  OR;  Service: Ophthalmology;  Laterality: Right;   APPENDECTOMY  1962   BIOPSY  12/14/2020   Procedure: BIOPSY;  Surgeon: Wilhelmenia Aloha Raddle., MD;  Location: [facility] ENDOSCOPY;  Service: Gastroenterology;;   ESOPHAGOGASTRODUODENOSCOPY (EGD) WITH PROPOFOL  N/A 12/14/2020   Procedure: ESOPHAGOGASTRODUODENOSCOPY (EGD) WITH PROPOFOL ;  Surgeon: Wilhelmenia Aloha Raddle., MD;  Location: [facility] ENDOSCOPY;  Service: Gastroenterology;  Laterality: N/A;   EXCISION MELANOMA WITH SENTINEL LYMPH NODE BIOPSY Right  10/09/2019   Procedure: WIDE LOCAL EXCISION WITH ADVANCEMENT FLAP CLOSURE RIGHT HIP MELANOMA WITH SENTINEL LYMPH NODE BIOPSY;  Surgeon: Aron Shoulders, MD;  Location: [facility] OR;  Service: General;  Laterality: Right;   EYE SURGERY     FINE NEEDLE ASPIRATION  12/14/2020   Procedure: FINE NEEDLE ASPIRATION (FNA) LINEAR;  Surgeon: Wilhelmenia Aloha Raddle., MD;  Location: [facility] ENDOSCOPY;  Service: Gastroenterology;;   HERNIA REPAIR  2009   IR BONE MARROW BIOPSY & ASPIRATION  05/08/2022   IR IMAGING GUIDED PORT INSERTION  12/30/2020   JOINT REPLACEMENT  2023   MOUTH SURGERY  11/2015   gum surgery and bone implant   PARS PLANA VITRECTOMY Right 12/07/2020   Procedure: PARS PLANA VITRECTOMY WITH 25 GAUGE;  Surgeon: Tobie Baptist, MD;  Location: [facility] OR;  Service: Ophthalmology;  Laterality: Right;   PHOTOCOAGULATION WITH LASER Right 12/07/2020   Procedure: PHOTOCOAGULATION WITH ENDOLASER PANRITINAL COAGULATION;  Surgeon: Tobie Baptist, MD;  Location: [facility] OR;  Service: Ophthalmology;  Laterality: Right;   TOTAL HIP ARTHROPLASTY Left 10/11/2021   Procedure: LEFT TOTAL HIP ARTHROPLASTY ANTERIOR APPROACH;  Surgeon: Sheril Coy, MD;  Location: [facility] ORS;  Service: Orthopedics;  Laterality: Left;   TOTAL HIP ARTHROPLASTY Right 01/10/2022   Procedure: RIGHT TOTAL HIP ARTHROPLASTY ANTERIOR APPROACH;  Surgeon: Sheril Coy, MD;  Location: [facility] ORS;  Service: Orthopedics;  Laterality: Right;   UPPER ESOPHAGEAL ENDOSCOPIC ULTRASOUND (EUS) N/A 12/14/2020   Procedure: UPPER ESOPHAGEAL ENDOSCOPIC ULTRASOUND (EUS);  Surgeon: Wilhelmenia Aloha Raddle., MD;  Location: [facility] ENDOSCOPY;  Service: Gastroenterology;  Laterality: N/A;   WISDOM TOOTH EXTRACTION      SOCIAL HISTORY: Social History   Socioeconomic History   Marital status: Widowed    Spouse name: Not on file   Number of children: Not on file   Years of education: Not on file   Highest education level: Not on file  Occupational History   Occupation: retired   Tobacco Use   Smoking status: Never   Smokeless tobacco: Never  Vaping Use   Vaping status: Never Used  Substance and Sexual Activity   Alcohol use: Never   Drug use: Never   Sexual activity: Not Currently  Other Topics Concern   Not on file  Social History Narrative   05/09/2021   Presently living at [facility] in [place].   One Son- lives locally, 1 grand-daughter[place]aughter- 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   Right handed   Home one level    Caffeine 1-cup   Social Drivers of Corporate Investment Banker Strain: Low Risk  (09/05/2022)   Overall Financial Resource Strain (CARDIA)    Difficulty of Paying Living Expenses: Not hard at all  Food Insecurity: No Food Insecurity (02/02/2023)   Hunger Vital Sign    Worried About Running Out of Food in the Last Year: Never true    Ran Out of Food in the Last Year: Never true  Transportation Needs: No Transportation Needs (02/02/2023)   PRAPARE - Administrator, Civil Service (Medical): No  Lack of Transportation (Non-Medical): No  Physical Activity: Sufficiently Active (09/05/2022)   Exercise Vital Sign    Days of Exercise per Week: 6 days    Minutes of Exercise per Session: 40 min  Stress: No Stress Concern Present (09/05/2022)   [employer] of Occupational Health - Occupational Stress Questionnaire    Feeling of Stress : Not at all  Social Connections: Moderately Isolated (09/05/2022)   Social Connection and Isolation Panel [NHANES]    Frequency of Communication with Friends and Family: More than three times a week    Frequency of Social Gatherings with Friends and Family: More than three times a week    Attends Religious Services: More than 4 times per year    Active Member of [employer] or Organizations: No    Attends Banker Meetings: Never    Marital Status: Widowed  Intimate Partner Violence: Not At Risk (02/02/2023)   Humiliation, Afraid, Rape, and Kick  questionnaire    Fear of Current or Ex-Partner: No    Emotionally Abused: No    Physically Abused: No    Sexually Abused: No    FAMILY HISTORY: Family History  Problem Relation Age of Onset   Coronary artery disease Father        died at 55   COPD Father    Alcohol abuse Father    Diabetes Mellitus II Father    Coronary artery disease Sister    Cancer Sister        breast   Heart attack Brother    Hyperlipidemia Other        died 37- natural causes   Hyperlipidemia Other        4 siblings    Hypertension Other        3 siblings    REVIEW OF SYSTEMS:   Constitutional: Denies fevers, chills or abnormal night sweats Eyes: Denies blurriness of vision, double vision or watery eyes Ears, nose, mouth, throat, and face: Denies mucositis or sore throat Respiratory: Denies cough, dyspnea or wheezes Cardiovascular: Denies palpitation, chest discomfort or lower extremity swelling Gastrointestinal: Denies nausea, heartburn or change in bowel habits Skin: Denies abnormal skin rashes Lymphatics: Denies new lymphadenopathy or easy bruising Neurological: Denies numbness, tingling or new weaknesses Behavioral/Psych: Mood is stable, no new changes  All other systems were reviewed with the patient and are negative.  PHYSICAL EXAMINATION: ECOG PERFORMANCE STATUS: 1 - Symptomatic but completely ambulatory  Vitals:   03/05/23 0915  BP: 134/70  Pulse: 96  Resp: 20  Temp: 98 F (36.7 C)  SpO2: 98%   There were no vitals filed for this visit.  GENERAL: alert, no distress and comfortable +pleasant SKIN: skin color, texture, turgor are normal, no rashes or significant lesions EYES: normal, conjunctiva are pink and non-injected, sclera clear OROPHARYNX: no exudate, no erythema and lips, buccal mucosa, and tongue normal  NECK: supple, thyroid  normal size, non-tender, without nodularity LYMPH: no palpable lymphadenopathy in the cervical, axillary or inguinal LUNGS: clear to  auscultation and percussion with normal breathing effort HEART: regular rate & rhythm and no murmurs and no lower extremity edema ABDOMEN: abdomen soft, non-tender and normal bowel sounds MUSCULOSKELETAL: no cyanosis of digits and no clubbing  PSYCH: alert & oriented x 3 with fluent speech NEURO: no focal motor/sensory deficits   ALLERGIES:  is allergic to decadron  [dexamethasone ], plavix  [clopidogrel ], prednisone, sulfa  antibiotics, and codeine.  MEDICATIONS:  Current Facility-Administered Medications  Medication Dose Route Frequency Provider Last Rate Last Admin  0.9 %  sodium chloride  infusion   Intravenous Continuous Timmy Maude SAUNDERS, MD       0.9 %  sodium chloride  infusion   Intravenous Continuous Timmy Maude SAUNDERS, MD       acetaminophen  (TYLENOL ) tablet 650 mg  650 mg Oral Once Timmy Maude SAUNDERS, MD       acetaminophen  (TYLENOL ) tablet 650 mg  650 mg Oral Q4H PRN Timmy Maude SAUNDERS, MD       alum & mag hydroxide-simeth (MAALOX/MYLANTA) 200-200-20 MG/5ML suspension 60 mL  60 mL Oral Q4H PRN Timmy Maude SAUNDERS, MD       diphenhydrAMINE  (BENADRYL ) capsule 50 mg  50 mg Oral Once Timmy Maude SAUNDERS, MD       enoxaparin  (LOVENOX ) injection 40 mg  40 mg Subcutaneous Q24H Timmy Maude SAUNDERS, MD       guaiFENesin -dextromethorphan  (ROBITUSSIN DM) 100-10 MG/5ML syrup 10 mL  10 mL Oral Q4H PRN Timmy Maude SAUNDERS, MD       hydrocortisone  (ANUSOL -HC) 2.5 % rectal cream 1 Application  1 Application Rectal BID PRN Timmy Maude SAUNDERS, MD       ondansetron  (ZOFRAN ) tablet 4-8 mg  4-8 mg Oral Q8H PRN Timmy Maude SAUNDERS, MD       Or   ondansetron  (ZOFRAN -ODT) disintegrating tablet 4-8 mg  4-8 mg Oral Q8H PRN Timmy Maude SAUNDERS, MD       Or   ondansetron  (ZOFRAN ) injection 4 mg  4 mg Intravenous Q8H PRN Timmy Maude SAUNDERS, MD       Or   ondansetron  (ZOFRAN ) 8 mg in sodium chloride  0.9 % 50 mL IVPB  8 mg Intravenous Q8H PRN Timmy Maude SAUNDERS, MD       riTUXimab -pvvr (RUXIENCE ) 900 mg in sodium chloride  0.9 % 250 mL  infusion  500 mg/m2 (Order-Specific) Intravenous Once Berlene Dixson R, MD       senna-docusate (Senokot-S) tablet 1 tablet  1 tablet Oral QHS PRN Timmy Maude SAUNDERS, MD       sodium bicarbonate  150 mEq in dextrose  5 % 1,150 mL infusion   Intravenous Continuous Timmy Maude SAUNDERS, MD       Facility-Administered Medications Ordered in Other Encounters  Medication Dose Route Frequency Provider Last Rate Last Admin   sodium chloride  flush (NS) 0.9 % injection 10 mL  10 mL Intravenous PRN Timmy Maude SAUNDERS, MD   10 mL at 10/04/21 0946     LABORATORY DATA:  I have reviewed the data as listed Lab Results  Component Value Date   WBC 8.6 02/15/2023   HGB 12.2 02/15/2023   HCT 35.5 (L) 02/15/2023   MCV 97.8 02/15/2023   PLT 183 02/15/2023   Recent Labs    10/30/22 0129 10/30/22 0516 02/02/23 0540 02/03/23 0609 02/15/23 0805  NA  --    < > 138 133* 138  K  --    < > 3.3* 3.7 4.2  CL  --    < > 97* 97* 102  CO2  --    < > 30 28 28   GLUCOSE  --    < > 142* 155* 102*  BUN  --    < > 12 16 20   CREATININE  --    < > 0.79 0.80 0.77  CALCIUM   --    < > 9.4 9.4 10.5*  GFRNONAA  --    < > >60 >60 >60  PROT 6.6   < > 5.6* 5.9* 6.7  ALBUMIN 4.0   < >  3.6 3.7 4.7  AST 18   < > 80* 85* 14*  ALT 28   < > 129* 167* 19  ALKPHOS 48   < > 44 43 90  BILITOT 0.6   < > 0.5 0.5 0.3  BILIDIR 0.1  --   --   --   --   IBILI 0.5  --   --   --   --    < > = values in this interval not displayed.    RADIOGRAPHIC STUDIES: I have personally reviewed the radiological images as listed and agreed with the findings in the report. No results found.   The total time spent in the appointment was 55 minutes encounter with patients including review of chart and various tests results, discussions about plan of care and coordination of care plan   All questions were answered. The patient knows to call the clinic with any problems, questions or concerns. No barriers to learning was detected.  Olam JINNY Brunner,  NP 1/6/202510:25 AM  ADDENDUM: I saw and examined Ms. Spradley.  I agree with the above assessment.  She is being mated for her next cycle of chemotherapy with methotrexate  and Rituxan ..  She is doing well.  She had a wonderful time down in [place]  with her daughter and her grandchildren.  So glad she was able to enjoy it.  Everything looks fine from my point of view.  Her labs look okay.  White cell count is 5.1.  Hemoglobin 9.9.  Platelet count 211,000.  Sodium of 137.  Potassium 3.8.  BUN 16 creatinine 0.58.  We have her on IV fluid with sodium bicarb so we can alkalize her urine for when we do high-dose methotrexate  tomorrow.  I really appreciate Pharmacy's help as they really do a wonderful job with the leucovorin  dosing.  We will give her the Rituxan  today.  She will then get methotrexate  on Tuesday.  She typically clears the methotrexate  fairly quickly.  I still am trying to get her to agree to CAR-T therapy.  Hopefully, she will agree to this.  I know she has been seen at [facility] for evaluation.  After this cycle, we will go ahead and set up for MRI of the brain so we see how everything looks.  We will follow her labs.  Again, I would expect that she will be able to be discharged over the weekend.  This is a typical timeframe for her.  As always, we had a wonderful prayer today.  Her faith remains incredibly strong.   Jeralyn Crease, MD  Ila 43:18

## 2023-03-06 ENCOUNTER — Encounter (HOSPITAL_COMMUNITY): Payer: Self-pay | Admitting: Hematology & Oncology

## 2023-03-06 DIAGNOSIS — C8580 Other specified types of non-Hodgkin lymphoma, unspecified site: Secondary | ICD-10-CM | POA: Diagnosis not present

## 2023-03-06 LAB — URINALYSIS, DIPSTICK ONLY
Bilirubin Urine: NEGATIVE
Glucose, UA: NEGATIVE mg/dL
Hgb urine dipstick: NEGATIVE
Ketones, ur: NEGATIVE mg/dL
Leukocytes,Ua: NEGATIVE
Nitrite: NEGATIVE
Protein, ur: NEGATIVE mg/dL
Specific Gravity, Urine: 1.011 (ref 1.005–1.030)
pH: 8 (ref 5.0–8.0)

## 2023-03-06 LAB — COMPREHENSIVE METABOLIC PANEL
ALT: 17 U/L (ref 0–44)
AST: 18 U/L (ref 15–41)
Albumin: 3.7 g/dL (ref 3.5–5.0)
Alkaline Phosphatase: 48 U/L (ref 38–126)
Anion gap: 8 (ref 5–15)
BUN: 13 mg/dL (ref 8–23)
CO2: 31 mmol/L (ref 22–32)
Calcium: 9.3 mg/dL (ref 8.9–10.3)
Chloride: 102 mmol/L (ref 98–111)
Creatinine, Ser: 0.6 mg/dL (ref 0.44–1.00)
GFR, Estimated: >60 mL/min (ref >60)
Glucose, Bld: 134 mg/dL — ABNORMAL HIGH (ref 70–99)
Potassium: 3.4 mmol/L — ABNORMAL LOW (ref 3.5–5.1)
Sodium: 141 mmol/L (ref 135–145)
Total Bilirubin: 0.2 mg/dL (ref 0.0–1.2)
Total Protein: 5.6 g/dL — ABNORMAL LOW (ref 6.5–8.1)

## 2023-03-06 LAB — CBC WITH DIFFERENTIAL/PLATELET
Abs Immature Granulocytes: 0.01 10*3/uL (ref 0.00–0.07)
Basophils Absolute: 0 10*3/uL (ref 0.0–0.1)
Basophils Relative: 0 %
Eosinophils Absolute: 0.1 10*3/uL (ref 0.0–0.5)
Eosinophils Relative: 3 %
HCT: 33.5 % — ABNORMAL LOW (ref 36.0–46.0)
Hemoglobin: 11.5 g/dL — ABNORMAL LOW (ref 12.0–15.0)
Immature Granulocytes: 0 %
Lymphocytes Relative: 35 %
Lymphs Abs: 1.1 10*3/uL (ref 0.7–4.0)
MCH: 34.1 pg — ABNORMAL HIGH (ref 26.0–34.0)
MCHC: 34.3 g/dL (ref 30.0–36.0)
MCV: 99.4 fL (ref 80.0–100.0)
Monocytes Absolute: 0.4 10*3/uL (ref 0.1–1.0)
Monocytes Relative: 13 %
Neutro Abs: 1.6 10*3/uL — ABNORMAL LOW (ref 1.7–7.7)
Neutrophils Relative %: 49 %
Platelets: 182 10*3/uL (ref 150–400)
RBC: 3.37 MIL/uL — ABNORMAL LOW (ref 3.87–5.11)
RDW: 12.9 % (ref 11.5–15.5)
WBC: 3.2 10*3/uL — ABNORMAL LOW (ref 4.0–10.5)
nRBC: 0 % (ref 0.0–0.2)

## 2023-03-06 MED ORDER — METHOTREXATE SODIUM (PF) CHEMO INJECTION 1 GM/40ML
4.0000 g/m2 | Freq: Once | INTRAVENOUS | Status: AC
  Administered 2023-03-06: 7.2 g via INTRAVENOUS
  Filled 2023-03-06: qty 288

## 2023-03-06 MED ORDER — SODIUM CHLORIDE 0.9 % IV SOLN
Freq: Once | INTRAVENOUS | Status: AC
  Administered 2023-03-06: 16 mg via INTRAVENOUS
  Filled 2023-03-06: qty 8

## 2023-03-06 NOTE — Progress Notes (Signed)
 IV Zofran given for nausea. Order written for nausea not relieved by compazine, but pt had no order for compazine. Secured chat sent to Dr. Myna Hidalgo. No response. Pt then given IV zofran.

## 2023-03-06 NOTE — Progress Notes (Signed)
 Ms. Manganello is doing well this morning.  She had no problems with the Rituxan .  She will have her high-dose methotrexate  today..  She is going to the bathroom without problems.  I think her urine pH was 8..  She has had no cough or shortness of breath.  There has been no headache.  She has had no mouth sores..  She has had little bit of hip pain but this been chronic.  Her labs show a sodium 141.  Potassium 3.4.  BUN 13 creatinine 0.6.  Calcium  9.3.  Her white cell count 3.2.  Hemoglobin 11.5.  Platelet count 182,000.  Her ANC is 1.6.  She has had no nausea or vomiting.  She has had no diarrhea.  There is been no rashes.  She has had no leg swelling.  Her vital signs with temperature of 98.  Pulse 99.  Blood pressure 123/70.  Her head and neck exam shows no ocular or oral lesions.  There are no palpable cervical or supraclavicular lymph nodes.  Lungs are clear bilaterally.  She has good air movement bilaterally.  Cardiac exam regular rate and rhythm.  Abdomen is soft.  Bowel sounds are present.  There is no guarding or rebound tenderness.  She has no palpable liver or spleen tip.  Extremity shows no clubbing, cyanosis or edema.  Neurological exam is nonfocal.   We will go ahead with a high-dose methotrexate  today.  I think her blood counts are okay.  Her urine pH is okay.  We will continue to monitor the urine pH.  I am just happy that she is doing so well.  As always, she really enjoys the staff up on [address].   Jeralyn Crease, MD  2 Cor  12:10

## 2023-03-07 ENCOUNTER — Encounter (HOSPITAL_COMMUNITY): Payer: Self-pay | Admitting: Hematology & Oncology

## 2023-03-07 DIAGNOSIS — C8580 Other specified types of non-Hodgkin lymphoma, unspecified site: Secondary | ICD-10-CM | POA: Diagnosis not present

## 2023-03-07 LAB — URINALYSIS, DIPSTICK ONLY
Bilirubin Urine: NEGATIVE
Glucose, UA: NEGATIVE mg/dL
Hgb urine dipstick: NEGATIVE
Ketones, ur: NEGATIVE mg/dL
Leukocytes,Ua: NEGATIVE
Nitrite: NEGATIVE
Protein, ur: NEGATIVE mg/dL
Specific Gravity, Urine: 1.011 (ref 1.005–1.030)
pH: 9 — ABNORMAL HIGH (ref 5.0–8.0)

## 2023-03-07 LAB — METHOTREXATE: Methotrexate: 11.32

## 2023-03-07 MED ORDER — PROCHLORPERAZINE EDISYLATE 10 MG/2ML IJ SOLN
10.0000 mg | Freq: Four times a day (QID) | INTRAMUSCULAR | Status: DC | PRN
  Administered 2023-03-08: 10 mg via INTRAVENOUS
  Filled 2023-03-07: qty 2

## 2023-03-07 MED ORDER — LEUCOVORIN CALCIUM INJECTION 100 MG
15.0000 mg | Freq: Four times a day (QID) | INTRAMUSCULAR | Status: AC
  Administered 2023-03-07 – 2023-03-08 (×4): 16 mg via INTRAVENOUS
  Filled 2023-03-07 (×9): qty 0.8

## 2023-03-07 MED ORDER — SODIUM CHLORIDE 0.9 % IV SOLN
Freq: Once | INTRAVENOUS | Status: AC
  Administered 2023-03-07: 16 mg via INTRAVENOUS
  Filled 2023-03-07: qty 8

## 2023-03-07 NOTE — Progress Notes (Signed)
 Ariel [name] received her methotrexate  yesterday.  She did well with this.  We will start checking her methotrexate  levels tomorrow.  I think she starts her leucovorin  rescue today.  I know that Pharmacy does a wonderful job and dosing this for us .  She had a little bit of nausea yesterday.  There has been no vomiting.  She feels well this morning.  She is on olanzapine .  Hopefully this will help with any nausea that she may have.  She has had no diarrhea.  There is been no mouth sores.  She is rinsing her mouth out vigorously.  He has no lab work back yet today.  She has had no fever.  There has been no bleeding.  She does have the hip and knee pain but this is relieved with some Tylenol .  Vital signs show temperature of 97.6.  Pulse 71.  Blood pressure 103/52.  Her head and neck exam shows no ocular or oral lesions.  There are no palpable cervical or supraclavicular lymph nodes.  Lungs are clear bilaterally.  Cardiac exam regular rate and rhythm.  She has no murmurs.  Abdomen is soft.  Bowel sounds are present.  There is no fluid wave.  There is no palpable liver or spleen tip.  Extremity shows no clubbing, cyanosis or edema.  Neurological exam is nonfocal.  Ariel [name] has received her Rituxan /methotrexate .  Now, we just have to await for the methotrexate  clearance.  She will start leucovorin  today.  Typically, it takes about 3 days for her to clear out the methotrexate  before she can go home.  We will see how her labs look.  We need to make sure that the urine stays alkaline.  I know that she is doing a great job.  I know the staff on [address]. are also doing a fantastic job.   Jeralyn Crease, MD  Proverbs 12:28

## 2023-03-07 NOTE — Plan of Care (Signed)
  Problem: Education: Goal: Knowledge of General Education information will improve Description: Including pain rating scale, medication(s)/side effects and non-pharmacologic comfort measures Outcome: Progressing   Problem: Health Behavior/Discharge Planning: Goal: Ability to manage health-related needs will improve Outcome: Progressing   Problem: Clinical Measurements: Goal: Ability to maintain clinical measurements within normal limits will improve Outcome: Progressing Goal: Will remain free from infection Outcome: Progressing Goal: Diagnostic test results will improve Outcome: Progressing Goal: Respiratory complications will improve Outcome: Progressing Goal: Cardiovascular complication will be avoided Outcome: Progressing   Problem: Activity: Goal: Risk for activity intolerance will decrease Outcome: Progressing   Problem: Nutrition: Goal: Adequate nutrition will be maintained Outcome: Progressing   Problem: Coping: Goal: Level of anxiety will decrease Outcome: Progressing   Problem: Elimination: Goal: Will not experience complications related to bowel motility Outcome: Progressing Goal: Will not experience complications related to urinary retention Outcome: Progressing   Problem: Pain Management: Goal: General experience of comfort will improve Outcome: Progressing   Problem: Skin Integrity: Goal: Risk for impaired skin integrity will decrease Outcome: Progressing   Problem: Education: Goal: Knowledge of the prescribed therapeutic regimen will improve Outcome: Progressing   Problem: Activity: Goal: Ability to implement measures to reduce episodes of fatigue will improve Outcome: Progressing   Problem: Bowel/Gastric: Goal: Will not experience complications related to bowel motility Outcome: Progressing   Problem: Coping: Goal: Ability to identify and develop effective coping behavior will improve Outcome: Progressing   Problem: Nutritional: Goal:  Maintenance of adequate nutrition will improve Outcome: Progressing

## 2023-03-08 ENCOUNTER — Other Ambulatory Visit: Payer: Self-pay

## 2023-03-08 ENCOUNTER — Encounter (HOSPITAL_COMMUNITY): Payer: Self-pay | Admitting: Hematology & Oncology

## 2023-03-08 DIAGNOSIS — C8580 Other specified types of non-Hodgkin lymphoma, unspecified site: Secondary | ICD-10-CM | POA: Diagnosis not present

## 2023-03-08 LAB — URINALYSIS, DIPSTICK ONLY
Bilirubin Urine: NEGATIVE
Glucose, UA: NEGATIVE mg/dL
Hgb urine dipstick: NEGATIVE
Ketones, ur: NEGATIVE mg/dL
Leukocytes,Ua: NEGATIVE
Nitrite: NEGATIVE
Protein, ur: NEGATIVE mg/dL
Specific Gravity, Urine: 1.005 (ref 1.005–1.030)
pH: 9 — ABNORMAL HIGH (ref 5.0–8.0)

## 2023-03-08 LAB — COMPREHENSIVE METABOLIC PANEL
ALT: 149 U/L — ABNORMAL HIGH (ref 0–44)
AST: 76 U/L — ABNORMAL HIGH (ref 15–41)
Albumin: 3.5 g/dL (ref 3.5–5.0)
Alkaline Phosphatase: 49 U/L (ref 38–126)
Anion gap: 9 (ref 5–15)
BUN: 10 mg/dL (ref 8–23)
CO2: 36 mmol/L — ABNORMAL HIGH (ref 22–32)
Calcium: 9.4 mg/dL (ref 8.9–10.3)
Chloride: 93 mmol/L — ABNORMAL LOW (ref 98–111)
Creatinine, Ser: 0.69 mg/dL (ref 0.44–1.00)
GFR, Estimated: >60 mL/min (ref >60)
Glucose, Bld: 130 mg/dL — ABNORMAL HIGH (ref 70–99)
Potassium: 3.4 mmol/L — ABNORMAL LOW (ref 3.5–5.1)
Sodium: 138 mmol/L (ref 135–145)
Total Bilirubin: 0.4 mg/dL (ref 0.0–1.2)
Total Protein: 5.6 g/dL — ABNORMAL LOW (ref 6.5–8.1)

## 2023-03-08 LAB — CBC WITH DIFFERENTIAL/PLATELET
Abs Immature Granulocytes: 0 10*3/uL (ref 0.00–0.07)
Basophils Absolute: 0 10*3/uL (ref 0.0–0.1)
Basophils Relative: 1 %
Eosinophils Absolute: 0.1 10*3/uL (ref 0.0–0.5)
Eosinophils Relative: 4 %
HCT: 34.8 % — ABNORMAL LOW (ref 36.0–46.0)
Hemoglobin: 11.7 g/dL — ABNORMAL LOW (ref 12.0–15.0)
Immature Granulocytes: 0 %
Lymphocytes Relative: 27 %
Lymphs Abs: 1 10*3/uL (ref 0.7–4.0)
MCH: 33.6 pg (ref 26.0–34.0)
MCHC: 33.6 g/dL (ref 30.0–36.0)
MCV: 100 fL (ref 80.0–100.0)
Monocytes Absolute: 0.4 10*3/uL (ref 0.1–1.0)
Monocytes Relative: 12 %
Neutro Abs: 2.2 10*3/uL (ref 1.7–7.7)
Neutrophils Relative %: 56 %
Platelets: 179 10*3/uL (ref 150–400)
RBC: 3.48 MIL/uL — ABNORMAL LOW (ref 3.87–5.11)
RDW: 12.5 % (ref 11.5–15.5)
WBC: 3.7 10*3/uL — ABNORMAL LOW (ref 4.0–10.5)
nRBC: 0 % (ref 0.0–0.2)

## 2023-03-08 LAB — METHOTREXATE: Methotrexate: 0.43

## 2023-03-08 MED ORDER — POTASSIUM CHLORIDE 10 MEQ/50ML IV SOLN
10.0000 meq | INTRAVENOUS | Status: AC
  Administered 2023-03-08 (×3): 10 meq via INTRAVENOUS
  Filled 2023-03-08: qty 50

## 2023-03-08 MED ORDER — LEUCOVORIN CALCIUM INJECTION 100 MG
16.0000 mg | Freq: Four times a day (QID) | INTRAMUSCULAR | Status: AC
Start: 2023-03-08 — End: 2023-03-09
  Administered 2023-03-08 – 2023-03-09 (×4): 16 mg via INTRAVENOUS
  Filled 2023-03-08 (×8): qty 0.8

## 2023-03-08 MED ORDER — SODIUM CHLORIDE 0.9 % IV SOLN
Freq: Once | INTRAVENOUS | Status: AC
  Administered 2023-03-08: 16 mg via INTRAVENOUS
  Filled 2023-03-08: qty 8

## 2023-03-08 NOTE — Progress Notes (Signed)
 So far, Ariel [name] is doing well.  She had some nausea yesterday.  She feels better this morning.  We will see what her methotrexate  level is.  She is on her leucovorin  rescue right now.  Again, usually takes about 3 days for her methotrexate  level to get down to where we can let her go home.  As expected, her LFTs are elevated.  The SGPT is 149 SGOT 76.  Bilirubin 0.4.  Her BUN is 10 creatinine 0.69.  Her potassium is 3.4.  We may want to replace this today.  Her white count 3.7.  Hemoglobin 11.7.  Platelet count 179,000.SABRA  She has had no mouth sores.  There is no problems with bowels or bladder.  She has had no dysuria.  Her urine pH is 9.  She is out of bed.  There is no bleeding.  She does have some joint aches and pains.  Her vital signs show a temperature of 98.  Pulse 79.  Blood pressure 126/64.  Her oral exam does not show any mucositis.  Lungs are clear.  Cardiac exam regular rate and rhythm.  Abdomen is soft.  Bowel sounds are present.  There is no fluid wave.  Extremity shows no clubbing, cyanosis or edema.  Neurological exam is not focal.   Ariel [name] had her fourth cycle of Rituxan /high-dose methotrexate .  She has responded well so far.  Will try to get her to agree to CAR-T therapy.  I think this would be appropriate for her.  We will see what the methotrexate  level is today.  Again, Pharmacy is doing a great job helping us .  I do appreciate all the help that she is getting from everybody upon [address].   Jeralyn Crease, MD  [place] [phone]

## 2023-03-09 ENCOUNTER — Encounter (HOSPITAL_COMMUNITY): Payer: Self-pay | Admitting: Hematology & Oncology

## 2023-03-09 DIAGNOSIS — C8580 Other specified types of non-Hodgkin lymphoma, unspecified site: Secondary | ICD-10-CM | POA: Diagnosis not present

## 2023-03-09 LAB — COMPREHENSIVE METABOLIC PANEL
ALT: 179 U/L — ABNORMAL HIGH (ref 0–44)
AST: 99 U/L — ABNORMAL HIGH (ref 15–41)
Albumin: 3.8 g/dL (ref 3.5–5.0)
Alkaline Phosphatase: 53 U/L (ref 38–126)
Anion gap: 10 (ref 5–15)
BUN: 10 mg/dL (ref 8–23)
CO2: 32 mmol/L (ref 22–32)
Calcium: 9.9 mg/dL (ref 8.9–10.3)
Chloride: 97 mmol/L — ABNORMAL LOW (ref 98–111)
Creatinine, Ser: 0.72 mg/dL (ref 0.44–1.00)
GFR, Estimated: >60 mL/min (ref >60)
Glucose, Bld: 133 mg/dL — ABNORMAL HIGH (ref 70–99)
Potassium: 3.6 mmol/L (ref 3.5–5.1)
Sodium: 139 mmol/L (ref 135–145)
Total Bilirubin: 0.6 mg/dL (ref 0.0–1.2)
Total Protein: 6.1 g/dL — ABNORMAL LOW (ref 6.5–8.1)

## 2023-03-09 LAB — CBC WITH DIFFERENTIAL/PLATELET
Abs Immature Granulocytes: 0.01 10*3/uL (ref 0.00–0.07)
Basophils Absolute: 0 10*3/uL (ref 0.0–0.1)
Basophils Relative: 0 %
Eosinophils Absolute: 0.1 10*3/uL (ref 0.0–0.5)
Eosinophils Relative: 1 %
HCT: 35.1 % — ABNORMAL LOW (ref 36.0–46.0)
Hemoglobin: 12 g/dL (ref 12.0–15.0)
Immature Granulocytes: 0 %
Lymphocytes Relative: 21 %
Lymphs Abs: 1.2 10*3/uL (ref 0.7–4.0)
MCH: 33.8 pg (ref 26.0–34.0)
MCHC: 34.2 g/dL (ref 30.0–36.0)
MCV: 98.9 fL (ref 80.0–100.0)
Monocytes Absolute: 0.2 10*3/uL (ref 0.1–1.0)
Monocytes Relative: 4 %
Neutro Abs: 4.1 10*3/uL (ref 1.7–7.7)
Neutrophils Relative %: 74 %
Platelets: 187 10*3/uL (ref 150–400)
RBC: 3.55 MIL/uL — ABNORMAL LOW (ref 3.87–5.11)
RDW: 12.3 % (ref 11.5–15.5)
WBC: 5.6 10*3/uL (ref 4.0–10.5)
nRBC: 0 % (ref 0.0–0.2)

## 2023-03-09 LAB — URINALYSIS, DIPSTICK ONLY
Bilirubin Urine: NEGATIVE
Glucose, UA: NEGATIVE mg/dL
Hgb urine dipstick: NEGATIVE
Ketones, ur: NEGATIVE mg/dL
Leukocytes,Ua: NEGATIVE
Nitrite: NEGATIVE
Protein, ur: 30 mg/dL — AB
Specific Gravity, Urine: 1.01 (ref 1.005–1.030)
pH: 9 — ABNORMAL HIGH (ref 5.0–8.0)

## 2023-03-09 LAB — METHOTREXATE: Methotrexate: 0.1

## 2023-03-09 MED ORDER — LEUCOVORIN CALCIUM INJECTION 100 MG
15.0000 mg | Freq: Four times a day (QID) | INTRAMUSCULAR | Status: DC
Start: 2023-03-09 — End: 2023-03-10
  Administered 2023-03-09 – 2023-03-10 (×3): 16 mg via INTRAVENOUS
  Filled 2023-03-09 (×8): qty 0.8

## 2023-03-09 NOTE — Plan of Care (Signed)
  Problem: Education: Goal: Knowledge of General Education information will improve Description: Including pain rating scale, medication(s)/side effects and non-pharmacologic comfort measures 03/09/2023 1142 by Mozqueda Jaramillo, Lennart Res, RN Outcome: Progressing 03/09/2023 1142 by Mozqueda Jaramillo, Lennart Res, RN Outcome: Progressing   Problem: Health Behavior/Discharge Planning: Goal: Ability to manage health-related needs will improve 03/09/2023 1142 by Rosamond Mould, Lennart Res, RN Outcome: Progressing 03/09/2023 1142 by Mozqueda Jaramillo, Lennart Res, RN Outcome: Progressing   Problem: Clinical Measurements: Goal: Ability to maintain clinical measurements within normal limits will improve 03/09/2023 1142 by Rosamond Mould, Lennart Res, RN Outcome: Progressing 03/09/2023 1142 by Mozqueda Jaramillo, Bruchy Mikel Alondra, RN Outcome: Progressing Goal: Will remain free from infection 03/09/2023 1142 by Rosamond Mould, Lennart Res, RN Outcome: Progressing 03/09/2023 1142 by Mozqueda Jaramillo, Graciella Arment Alondra, RN Outcome: Progressing Goal: Diagnostic test results will improve 03/09/2023 1142 by Mozqueda Jaramillo, Lennart Res, RN Outcome: Progressing 03/09/2023 1142 by Mozqueda Jaramillo, Ceanna Wareing Alondra, RN Outcome: Progressing Goal: Respiratory complications will improve 03/09/2023 1142 by Rosamond Mould Lennart Res, RN Outcome: Progressing 03/09/2023 1142 by Mozqueda Jaramillo, Bob Daversa Alondra, RN Outcome: Progressing Goal: Cardiovascular complication will be avoided 03/09/2023 1142 by Rosamond Mould Lennart Res, RN Outcome: Progressing 03/09/2023 1142 by Mozqueda Jaramillo, Karely Hurtado Alondra, RN Outcome: Progressing   Problem: Activity: Goal: Risk for activity intolerance will decrease 03/09/2023 1142 by Mozqueda Jaramillo, Lennart Res, RN Outcome: Progressing 03/09/2023 1142 by Mozqueda Jaramillo, Lennart Res, RN Outcome:  Progressing   Problem: Nutrition: Goal: Adequate nutrition will be maintained 03/09/2023 1142 by Mozqueda Jaramillo, Lennart Res, RN Outcome: Progressing 03/09/2023 1142 by Mozqueda Jaramillo, Lennart Res, RN Outcome: Progressing   Problem: Coping: Goal: Level of anxiety will decrease 03/09/2023 1142 by Mozqueda Jaramillo, Latunya Kissick Alondra, RN Outcome: Progressing 03/09/2023 1142 by Mozqueda Jaramillo, Lennart Res, RN Outcome: Progressing   Problem: Elimination: Goal: Will not experience complications related to bowel motility 03/09/2023 1142 by Rosamond Mould Lennart Res, RN Outcome: Progressing 03/09/2023 1142 by Mozqueda Jaramillo, Kayelyn Lemon Alondra, RN Outcome: Progressing Goal: Will not experience complications related to urinary retention 03/09/2023 1142 by Mozqueda Jaramillo, Jencarlo Bonadonna Alondra, RN Outcome: Progressing 03/09/2023 1142 by Mozqueda Jaramillo, Orvill Coulthard Alondra, RN Outcome: Progressing   Problem: Pain Management: Goal: General experience of comfort will improve 03/09/2023 1142 by Mozqueda Jaramillo, Lennart Res, RN Outcome: Progressing 03/09/2023 1142 by Mozqueda Jaramillo, Ival Pacer Alondra, RN Outcome: Progressing   Problem: Skin Integrity: Goal: Risk for impaired skin integrity will decrease 03/09/2023 1142 by Rosamond Mould Lennart Res, RN Outcome: Progressing 03/09/2023 1142 by Mozqueda Jaramillo, Jeralynn Vaquera Alondra, RN Outcome: Progressing   Problem: Education: Goal: Knowledge of the prescribed therapeutic regimen will improve 03/09/2023 1142 by Mozqueda Jaramillo, Yasemin Rabon Alondra, RN Outcome: Progressing 03/09/2023 1142 by Mozqueda Jaramillo, Paulla Mcclaskey Alondra, RN Outcome: Progressing   Problem: Activity: Goal: Ability to implement measures to reduce episodes of fatigue will improve Outcome: Progressing   Problem: Bowel/Gastric: Goal: Will not experience complications related to bowel motility Outcome: Progressing   Problem: Coping: Goal:  Ability to identify and develop effective coping behavior will improve Outcome: Progressing   Problem: Nutritional: Goal: Maintenance of adequate nutrition will improve Outcome: Progressing

## 2023-03-09 NOTE — TOC Progression Note (Addendum)
 Transition of Care [facility]) - Inpatient Brief Assessment  Patient Details  Name: Ariel [name] MRN: [id number] Date of Birth: 1956/01/31  Transition of Care [facility]) CM/SW Contact:    Duwaine GORMAN Aran, LCSW Phone Number: 03/09/2023, 1:26 PM  Clinical Narrative: Screening completed. Patient is a resident at [facility]. No TOC needs identified at this time.  Transition of Care Asessment: Insurance and Status: Insurance coverage has been reviewed Patient has primary care physician: Yes Home environment has been reviewed: Resides at [facility] independent living facility Prior level of function:: Independent with ADLs at baseline Prior/Current Home Services: No current home services Social Drivers of Health Review: SDOH reviewed no interventions necessary Readmission risk has been reviewed: Yes Transition of care needs: no transition of care needs at this time  Social Determinants of Health (SDOH) Interventions SDOH Screenings   Food Insecurity: No Food Insecurity (03/05/2023)  Housing: Low Risk  (03/05/2023)  Transportation Needs: No Transportation Needs (03/05/2023)  Utilities: Not At Risk (03/05/2023)  Alcohol Screen: Low Risk  (09/05/2022)  Depression (PHQ2-9): Low Risk  (09/06/2022)  Financial Resource Strain: Low Risk  (09/05/2022)  Physical Activity: Sufficiently Active (09/05/2022)  Social Connections: Moderately Isolated (03/05/2023)  Stress: No Stress Concern Present (09/05/2022)  Tobacco Use: Low Risk  (03/07/2023)   Readmission Risk Interventions    03/09/2023    1:25 PM 02/02/2023   11:21 AM 01/12/2023   12:30 PM  Readmission Risk Prevention Plan  Transportation Screening Complete Complete Complete  PCP or Specialist Appt within 3-5 Days  Complete Complete  HRI or Home Care Consult Complete Complete Complete  Social Work Consult for Recovery Care Planning/Counseling Complete Complete Complete  Palliative Care Screening Not Applicable Not Applicable Not Applicable  Medication  Review Oceanographer) Complete Complete Complete

## 2023-03-09 NOTE — Progress Notes (Signed)
 So far, everything is going okay for her.  She is metabolizing the methotrexate  well.  Yesterday, her methotrexate  level was 0.43.  It should be a lot lower today.  She did have some nausea yesterday.  She feels a lot better this morning.  She has had no diarrhea.  There is been no mouth sores.  She has had no cough or shortness of breath.  There is been no bleeding.  She does have her joint pains.  Her labs today show sodium 139.  Potassium 3.6.  BUN 10 creatinine 0.72.  SGPT 179 SGOT 100.  Bilirubin is 0.6.  Her white cell count is 5.6.  Hemoglobin 12.  Her platelet count is 187,000.  She has had no fever.  There has been no headache.  Her vital signs are temperature of 97.9.  Pulse 83.  Blood pressure 121/63.  Her head and neck exam shows no oral mucositis.  There is no adenopathy in the neck.  Lungs are clear bilaterally.  Cardiac exam regular rate and rhythm.  Abdomen soft.  Bowel sounds are present.  There is no fluid wave.  There is no palpable liver or spleen tip.  Extremity shows no clubbing, cyanosis or edema.  Neurological exam shows no focal neurological deficits.  Again, we will see what the methotrexate  level is today.  She continues on her leucovorin  rescue.  I do appreciate Pharmacy helping us  out so much.  Again, I would like to think that she should be able to go home tomorrow.  It is not surprising that the LFTs are elevated.  This was happens when she takes methotrexate .  Again, I know she is getting fantastic care from everybody up on [address].   Ariel Crease, MD  [place] 18:20

## 2023-03-09 NOTE — Progress Notes (Signed)
 Mobility Specialist - Progress Note   03/09/23 1147  Mobility  Activity Ambulated independently in hallway  Level of Assistance Independent  Assistive Device None  Distance Ambulated (ft) 500 ft  Activity Response Tolerated well  Mobility Referral Yes  Mobility visit 1 Mobility  Mobility Specialist Start Time (ACUTE ONLY) 1134  Mobility Specialist Stop Time (ACUTE ONLY) 1145  Mobility Specialist Time Calculation (min) (ACUTE ONLY) 11 min   Pt received in bed and agreeable to mobility. No complaints during session. Pt to bed after session with all needs met.    [facility]

## 2023-03-09 NOTE — Plan of Care (Signed)
  Problem: Education: Goal: Knowledge of General Education information will improve Description: Including pain rating scale, medication(s)/side effects and non-pharmacologic comfort measures Outcome: Progressing   Problem: Health Behavior/Discharge Planning: Goal: Ability to manage health-related needs will improve Outcome: Progressing   Problem: Clinical Measurements: Goal: Ability to maintain clinical measurements within normal limits will improve Outcome: Progressing Goal: Will remain free from infection Outcome: Progressing Goal: Diagnostic test results will improve Outcome: Progressing Goal: Respiratory complications will improve Outcome: Progressing Goal: Cardiovascular complication will be avoided Outcome: Progressing   Problem: Activity: Goal: Risk for activity intolerance will decrease Outcome: Progressing   Problem: Nutrition: Goal: Adequate nutrition will be maintained Outcome: Progressing   Problem: Coping: Goal: Level of anxiety will decrease Outcome: Progressing   Problem: Elimination: Goal: Will not experience complications related to bowel motility Outcome: Progressing Goal: Will not experience complications related to urinary retention Outcome: Progressing   Problem: Pain Management: Goal: General experience of comfort will improve Outcome: Progressing   Problem: Skin Integrity: Goal: Risk for impaired skin integrity will decrease Outcome: Progressing   Problem: Education: Goal: Knowledge of the prescribed therapeutic regimen will improve Outcome: Progressing   Problem: Activity: Goal: Ability to implement measures to reduce episodes of fatigue will improve Outcome: Progressing   Problem: Bowel/Gastric: Goal: Will not experience complications related to bowel motility Outcome: Progressing   Problem: Coping: Goal: Ability to identify and develop effective coping behavior will improve Outcome: Progressing   Problem: Nutritional: Goal:  Maintenance of adequate nutrition will improve Outcome: Progressing

## 2023-03-10 ENCOUNTER — Encounter (HOSPITAL_COMMUNITY): Payer: Self-pay | Admitting: Hematology & Oncology

## 2023-03-10 ENCOUNTER — Other Ambulatory Visit: Payer: Self-pay

## 2023-03-10 DIAGNOSIS — C8580 Other specified types of non-Hodgkin lymphoma, unspecified site: Secondary | ICD-10-CM | POA: Diagnosis not present

## 2023-03-10 LAB — URINALYSIS, DIPSTICK ONLY
Bilirubin Urine: NEGATIVE
Glucose, UA: NEGATIVE mg/dL
Hgb urine dipstick: NEGATIVE
Ketones, ur: NEGATIVE mg/dL
Leukocytes,Ua: NEGATIVE
Nitrite: NEGATIVE
Protein, ur: NEGATIVE mg/dL
Specific Gravity, Urine: 1.006 (ref 1.005–1.030)
pH: 9 — ABNORMAL HIGH (ref 5.0–8.0)

## 2023-03-10 LAB — CBC WITH DIFFERENTIAL/PLATELET
Abs Immature Granulocytes: 0.01 10*3/uL (ref 0.00–0.07)
Basophils Absolute: 0 10*3/uL (ref 0.0–0.1)
Basophils Relative: 0 %
Eosinophils Absolute: 0.1 10*3/uL (ref 0.0–0.5)
Eosinophils Relative: 3 %
HCT: 33.6 % — ABNORMAL LOW (ref 36.0–46.0)
Hemoglobin: 11.3 g/dL — ABNORMAL LOW (ref 12.0–15.0)
Immature Granulocytes: 0 %
Lymphocytes Relative: 25 %
Lymphs Abs: 1 10*3/uL (ref 0.7–4.0)
MCH: 33.1 pg (ref 26.0–34.0)
MCHC: 33.6 g/dL (ref 30.0–36.0)
MCV: 98.5 fL (ref 80.0–100.0)
Monocytes Absolute: 0.2 10*3/uL (ref 0.1–1.0)
Monocytes Relative: 4 %
Neutro Abs: 2.7 10*3/uL (ref 1.7–7.7)
Neutrophils Relative %: 68 %
Platelets: 197 10*3/uL (ref 150–400)
RBC: 3.41 MIL/uL — ABNORMAL LOW (ref 3.87–5.11)
RDW: 12 % (ref 11.5–15.5)
WBC: 4 10*3/uL (ref 4.0–10.5)
nRBC: 0 % (ref 0.0–0.2)

## 2023-03-10 LAB — COMPREHENSIVE METABOLIC PANEL
ALT: 171 U/L — ABNORMAL HIGH (ref 0–44)
AST: 73 U/L — ABNORMAL HIGH (ref 15–41)
Albumin: 3.5 g/dL (ref 3.5–5.0)
Alkaline Phosphatase: 49 U/L (ref 38–126)
Anion gap: 8 (ref 5–15)
BUN: 13 mg/dL (ref 8–23)
CO2: 35 mmol/L — ABNORMAL HIGH (ref 22–32)
Calcium: 9.3 mg/dL (ref 8.9–10.3)
Chloride: 95 mmol/L — ABNORMAL LOW (ref 98–111)
Creatinine, Ser: 0.58 mg/dL (ref 0.44–1.00)
GFR, Estimated: >60 mL/min (ref >60)
Glucose, Bld: 169 mg/dL — ABNORMAL HIGH (ref 70–99)
Potassium: 3.5 mmol/L (ref 3.5–5.1)
Sodium: 138 mmol/L (ref 135–145)
Total Bilirubin: 0.4 mg/dL (ref 0.0–1.2)
Total Protein: 5.6 g/dL — ABNORMAL LOW (ref 6.5–8.1)

## 2023-03-10 MED ORDER — FLUCONAZOLE 100 MG PO TABS
100.0000 mg | ORAL_TABLET | Freq: Every day | ORAL | Status: DC
Start: 2023-03-10 — End: 2023-03-10
  Filled 2023-03-10: qty 1

## 2023-03-10 MED ORDER — LEVOFLOXACIN 500 MG PO TABS
500.0000 mg | ORAL_TABLET | Freq: Every day | ORAL | Status: DC
Start: 2023-03-10 — End: 2023-03-10

## 2023-03-10 MED ORDER — HEPARIN SOD (PORK) LOCK FLUSH 100 UNIT/ML IV SOLN
500.0000 [IU] | Freq: Once | INTRAVENOUS | Status: DC
  Filled 2023-03-10: qty 5

## 2023-03-10 MED ORDER — FAMCICLOVIR 500 MG PO TABS
250.0000 mg | ORAL_TABLET | Freq: Every day | ORAL | Status: DC
Start: 2023-03-10 — End: 2023-03-10

## 2023-03-10 MED ORDER — HEPARIN SOD (PORK) LOCK FLUSH 100 UNIT/ML IV SOLN
INTRAVENOUS | Status: AC
  Administered 2023-03-10: 500 [IU]
  Filled 2023-03-10: qty 5

## 2023-03-10 NOTE — Discharge Summary (Signed)
 Physician Discharge Summary  Patient ID: Ariel [name] @ATTENDINGNPI @ MRN: [id number] DOB/AGE: May 09, 1955 68 y.o.  Admit date: 03/05/2023 Discharge date: 03/10/2023  Admission Diagnoses: Recurrent CNS non-Hodgkin's lymphoma; admitted for chemotherapy.  Discharge Diagnoses:  Principal Problem:   Lymphoma malignant, large cell (HCC)   Discharged Condition: good  Discharge Labs:   Significant Diagnostic Studies: None  Consults: None  Disposition: Discharge disposition: 01-Home or Self Care       Treatments: Chemotherapy with Rituxan  and high-dose methotrexate   Allergies as of 03/10/2023       Reactions   Decadron  [dexamethasone ] Other (See Comments)   Leg weakness Hallucinations   Plavix  [clopidogrel ] Hives, Swelling, Other (See Comments)   Lips became swollen   Prednisone Other (See Comments)   Steroids caused psychological issues  Hallucinations   Sulfa  Antibiotics Itching, Rash   Codeine Nausea And Vomiting        Medication List     STOP taking these medications    amoxicillin  500 MG capsule Commonly known as: AMOXIL        TAKE these medications    acetaminophen  325 MG tablet Commonly known as: TYLENOL  Take 650 mg by mouth every 6 (six) hours as needed for headache, fever, moderate pain (pain score 4-6) or mild pain (pain score 1-3).   antiseptic oral rinse Liqd 15 mLs by Mouth Rinse route every 6 (six) hours. What changed:  when to take this reasons to take this   famciclovir  500 MG tablet Commonly known as: FAMVIR  Take 500 mg by mouth daily.   fluconazole  100 MG tablet Commonly known as: DIFLUCAN  Take 100 mg by mouth daily.   ondansetron  8 MG disintegrating tablet Commonly known as: ZOFRAN -ODT Take 1 tablet (8 mg total) by mouth every 8 (eight) hours as needed for nausea or vomiting.   OVER THE COUNTER MEDICATION Take 2 capsules by mouth daily. Balance of Nature veggies   OVER THE COUNTER MEDICATION Take 2 capsules by mouth daily.  Balance of Nature Fruits   sodium bicarbonate /sodium chloride  Soln 15 Applications by Mouth Rinse route every 4 (four) hours.         Follow-up Information     Timmy Ariel SAUNDERS, MD Follow up.   Specialty: Oncology Why: Come to office on Monday for lab work. Contact information: [address] [place] [address] [phone]                 Hospital Course: Ariel [name] was admitted for chemotherapy.  She had her Port-A-Cath accessed.  She was started on IV fluids.  She got a bicarbonate in the IV fluids to keep her urine alkalized.  On day 1, she received her Rituxan .  She had no problems with Rituxan .  Day 2, she received her high-dose methotrexate .  She tolerated this very nicely.  She then received leucovorin  rescue as dosed by pharmacy.  She had the expected nausea.  She had a couple episodes of vomiting.  This all resolved with as needed antiemetics.  We did have her on olanzapine  to help with nausea and vomiting.  I think she did have some hypokalemia which she received some IV potassium.  She was ambulating.  She had no diarrhea.  She had no mouth sores.  She had no fever.  There was no bleeding.  She was eating better.  As expected, her LFTs were elevated.  When she was discharged, her SGPT was 171 SGOT was 73.  Blood sugar was 169.  BUN 13 creatinine  0.58.  Her sodium was 138 potassium was 3.5.  Her white cell count is 4.  Hemoglobin 11.3.  Platelet count 197,000.  On the day before discharge, her methotrexate  level was 0.1.  I felt that she would be able to go home.  She wanted to go home.  She really did a great job.     Discharge Exam: Blood pressure 123/69, pulse 70, temperature 98 F (36.7 C), temperature source Oral, resp. rate 18, height 5' 7 (1.702 m), weight 153 lb 7 oz (69.6 kg), SpO2 95%. On her physical exam, her head neck exam showed no ocular or oral lesions.  There are no palpable cervical supraclavicular lymph nodes.  She  has no mucositis.  Lungs are clear bilaterally.  Cardiac exam regular rate and rhythm.  She has no murmurs.  Abdomen is soft.  Bowel sounds are present.  There is no fluid wave.  There is no palpable liver or spleen tip.  Extremity shows no clubbing, cyanosis or edema.  She has good range of motion of her joints.  Skin exam showed no rashes.  Neurological exam was nonfocal.    Signed: Maude JONELLE [name] 03/10/2023, 7:41 AM

## 2023-03-10 NOTE — Progress Notes (Signed)
 Pt discharged to home. Dc instructions given. No concerns verbalized. Left unit in wheelchair pushed by Mariam, NT. Left in stable condition.

## 2023-03-10 NOTE — Plan of Care (Signed)
   Problem: Education: Goal: Knowledge of General Education information will improve Description Including pain rating scale, medication(s)/side effects and non-pharmacologic comfort measures Outcome: Progressing

## 2023-03-12 ENCOUNTER — Inpatient Hospital Stay: Payer: Medicare Other | Attending: Internal Medicine

## 2023-03-12 ENCOUNTER — Other Ambulatory Visit: Payer: Self-pay | Admitting: *Deleted

## 2023-03-12 ENCOUNTER — Telehealth: Payer: Self-pay

## 2023-03-12 ENCOUNTER — Inpatient Hospital Stay: Payer: Medicare Other

## 2023-03-12 DIAGNOSIS — C83398 Diffuse large b-cell lymphoma of other extranodal and solid organ sites: Secondary | ICD-10-CM | POA: Diagnosis not present

## 2023-03-12 DIAGNOSIS — Z79899 Other long term (current) drug therapy: Secondary | ICD-10-CM | POA: Insufficient documentation

## 2023-03-12 DIAGNOSIS — Z9221 Personal history of antineoplastic chemotherapy: Secondary | ICD-10-CM | POA: Diagnosis not present

## 2023-03-12 DIAGNOSIS — Z885 Allergy status to narcotic agent status: Secondary | ICD-10-CM | POA: Diagnosis not present

## 2023-03-12 DIAGNOSIS — M25561 Pain in right knee: Secondary | ICD-10-CM | POA: Diagnosis not present

## 2023-03-12 DIAGNOSIS — Z888 Allergy status to other drugs, medicaments and biological substances status: Secondary | ICD-10-CM | POA: Insufficient documentation

## 2023-03-12 DIAGNOSIS — M25562 Pain in left knee: Secondary | ICD-10-CM | POA: Diagnosis not present

## 2023-03-12 DIAGNOSIS — D509 Iron deficiency anemia, unspecified: Secondary | ICD-10-CM | POA: Diagnosis not present

## 2023-03-12 DIAGNOSIS — Z882 Allergy status to sulfonamides status: Secondary | ICD-10-CM | POA: Diagnosis not present

## 2023-03-12 DIAGNOSIS — C8510 Unspecified B-cell lymphoma, unspecified site: Secondary | ICD-10-CM

## 2023-03-12 LAB — COMPREHENSIVE METABOLIC PANEL
ALT: 96 U/L — ABNORMAL HIGH (ref 0–44)
AST: 24 U/L (ref 15–41)
Albumin: 4.9 g/dL (ref 3.5–5.0)
Alkaline Phosphatase: 61 U/L (ref 38–126)
Anion gap: 8 (ref 5–15)
BUN: 22 mg/dL (ref 8–23)
CO2: 29 mmol/L (ref 22–32)
Calcium: 10.4 mg/dL — ABNORMAL HIGH (ref 8.9–10.3)
Chloride: 100 mmol/L (ref 98–111)
Creatinine, Ser: 0.76 mg/dL (ref 0.44–1.00)
GFR, Estimated: >60 mL/min (ref >60)
Glucose, Bld: 93 mg/dL (ref 70–99)
Potassium: 3.9 mmol/L (ref 3.5–5.1)
Sodium: 137 mmol/L (ref 135–145)
Total Bilirubin: 0.3 mg/dL (ref 0.0–1.2)
Total Protein: 6.9 g/dL (ref 6.5–8.1)

## 2023-03-12 LAB — CBC WITH DIFFERENTIAL (CANCER CENTER ONLY)
Abs Immature Granulocytes: 0.03 10*3/uL (ref 0.00–0.07)
Basophils Absolute: 0 10*3/uL (ref 0.0–0.1)
Basophils Relative: 1 %
Eosinophils Absolute: 0 10*3/uL (ref 0.0–0.5)
Eosinophils Relative: 1 %
HCT: 36.8 % (ref 36.0–46.0)
Hemoglobin: 12.7 g/dL (ref 12.0–15.0)
Immature Granulocytes: 1 %
Lymphocytes Relative: 26 %
Lymphs Abs: 1.6 10*3/uL (ref 0.7–4.0)
MCH: 33.4 pg (ref 26.0–34.0)
MCHC: 34.5 g/dL (ref 30.0–36.0)
MCV: 96.8 fL (ref 80.0–100.0)
Monocytes Absolute: 0.4 10*3/uL (ref 0.1–1.0)
Monocytes Relative: 7 %
Neutro Abs: 4.2 10*3/uL (ref 1.7–7.7)
Neutrophils Relative %: 64 %
Platelet Count: 175 10*3/uL (ref 150–400)
RBC: 3.8 MIL/uL — ABNORMAL LOW (ref 3.87–5.11)
RDW: 11.9 % (ref 11.5–15.5)
WBC Count: 6.3 10*3/uL (ref 4.0–10.5)
nRBC: 0 % (ref 0.0–0.2)

## 2023-03-12 LAB — SAMPLE TO BLOOD BANK

## 2023-03-12 NOTE — Transitions of Care (Post Inpatient/ED Visit) (Signed)
   03/12/2023  Name: Ariel [name] MRN: [id number] DOB: 12-24-1955  Today's TOC FU Call Status: Today's TOC FU Call Status:: Unsuccessful Call (1st Attempt) Unsuccessful Call (1st Attempt) Date: 03/12/23  Attempted to reach the patient regarding the most recent Inpatient/ED visit.  Follow Up Plan: Additional outreach attempts will be made to reach the patient to complete the Transitions of Care (Post Inpatient/ED visit) call.   Itati Brocksmith Gladis BSN, Programmer, Systems / Transitions of Care [place] [facility], [facility] Direct Dial Number:  [phone]

## 2023-03-13 ENCOUNTER — Telehealth: Payer: Self-pay | Admitting: *Deleted

## 2023-03-13 ENCOUNTER — Other Ambulatory Visit: Payer: Self-pay

## 2023-03-13 NOTE — Transitions of Care (Post Inpatient/ED Visit) (Signed)
   03/13/2023  Name: Ariel [name] MRN: [id number] DOB: [birth year]  Today's TOC FU Call Status: Today's TOC FU Call Status:: Unsuccessful Call (2nd Attempt) Unsuccessful Call (2nd Attempt) Date: 03/13/23  Attempted to reach the patient regarding the most recent Inpatient/ED visit.  Follow Up Plan: Additional outreach attempts will be made to reach the patient to complete the Transitions of Care (Post Inpatient/ED visit) call.   Cathlean Headland BSN RN Population Health- Transition of Care Team.  [facility] [phone]

## 2023-03-14 ENCOUNTER — Telehealth: Payer: Self-pay

## 2023-03-14 ENCOUNTER — Other Ambulatory Visit: Payer: Self-pay

## 2023-03-14 NOTE — Transitions of Care (Post Inpatient/ED Visit) (Signed)
   03/14/2023  Name: Ariel [name] MRN: [id number] DOB: 1956/02/15  Today's TOC FU Call Status: Today's TOC FU Call Status:: Successful TOC FU Call Completed TOC FU Call Complete Date: 03/14/23 Patient's Name and Date of Birth confirmed.  Transition Care Management Follow-up Telephone Call Date of Discharge: 03/10/23 Discharge Facility: [facility]) Type of Discharge: Inpatient Admission (Planned admission for chemotherapy treatment) Primary Inpatient Discharge Diagnosis:: B-Cell Lymphoma, Planned admission for chemotherapy treatment as Inpatient. How have you been since you were released from the hospital?: Better Any questions or concerns?: No (Patient declined further interactions on follow up call staing she was doing great but did not have time for this call and is followed by Dr Maria Shiner.)  Items Reviewed:  Patient declined Medications obtained,verified, and reconciled?: No (Patient declined follow up call questions.) Medications Not Reviewed Reasons:: Other: (Patient declined.)  Medications Reviewed Today:  Patient declined Medications Reviewed Today   Medications were not reviewed in this encounter     Home Care and Equipment/Supplies: Were Home Health Services Ordered?: No Any new equipment or medical supplies ordered?: No  Functional Questionnaire: Patient declined    Follow up appointments reviewed:  Patient declined Specialist Hospital Follow-up appointment confirmed?: NA (Patient declined but AVS indicated patient to go to oncology for labwork on Monday 03/19/23.)  03/14/23: RN CM TOC outreach #3 call was answered, identity confirmed, patient answered a few initial questions, stating she felt great and was doing well but declined medication review and the rest of the TOC follow up questions. No HH or DME orders. There is a follow up with oncology lab work on the AVS for Monday 03/19/23. There is no PCP appointment scheduled in EMR at this time. Patient declined a call  back at another time, stating she did not have time. No further follow up call required. This was a planned admission for chemotherapy treatment.   Katheryn Pandy MSN, RN Care Coordinator [facility]; [facility] Office Hours Wed/Thur  8:00 am-6:00 pm Direct Dial: [phone] Main Phone [phone]  Fax: [phone] [email or web address]

## 2023-03-19 LAB — METHOTREXATE: Methotrexate: <0.05

## 2023-03-29 ENCOUNTER — Other Ambulatory Visit: Payer: Self-pay | Admitting: *Deleted

## 2023-03-29 ENCOUNTER — Other Ambulatory Visit: Payer: Medicare Other

## 2023-03-29 ENCOUNTER — Ambulatory Visit: Payer: Medicare Other | Admitting: Hematology & Oncology

## 2023-03-29 DIAGNOSIS — C8510 Unspecified B-cell lymphoma, unspecified site: Secondary | ICD-10-CM

## 2023-03-30 ENCOUNTER — Encounter (HOSPITAL_COMMUNITY): Payer: Self-pay

## 2023-03-30 ENCOUNTER — Encounter: Payer: Self-pay | Admitting: Hematology & Oncology

## 2023-03-30 ENCOUNTER — Telehealth: Payer: Self-pay | Admitting: *Deleted

## 2023-03-30 ENCOUNTER — Inpatient Hospital Stay: Payer: Medicare Other

## 2023-03-30 ENCOUNTER — Inpatient Hospital Stay: Payer: Medicare Other | Admitting: Hematology & Oncology

## 2023-03-30 VITALS — BP 161/67 | HR 109 | Temp 98.0°F | Resp 18 | Ht 66.0 in | Wt 166.0 lb

## 2023-03-30 DIAGNOSIS — Z9221 Personal history of antineoplastic chemotherapy: Secondary | ICD-10-CM | POA: Diagnosis not present

## 2023-03-30 DIAGNOSIS — Z888 Allergy status to other drugs, medicaments and biological substances status: Secondary | ICD-10-CM | POA: Diagnosis not present

## 2023-03-30 DIAGNOSIS — Z79899 Other long term (current) drug therapy: Secondary | ICD-10-CM | POA: Diagnosis not present

## 2023-03-30 DIAGNOSIS — C8510 Unspecified B-cell lymphoma, unspecified site: Secondary | ICD-10-CM

## 2023-03-30 DIAGNOSIS — Z885 Allergy status to narcotic agent status: Secondary | ICD-10-CM | POA: Diagnosis not present

## 2023-03-30 DIAGNOSIS — Z882 Allergy status to sulfonamides status: Secondary | ICD-10-CM | POA: Diagnosis not present

## 2023-03-30 DIAGNOSIS — D509 Iron deficiency anemia, unspecified: Secondary | ICD-10-CM | POA: Diagnosis not present

## 2023-03-30 DIAGNOSIS — M25561 Pain in right knee: Secondary | ICD-10-CM | POA: Diagnosis not present

## 2023-03-30 DIAGNOSIS — C83398 Diffuse large b-cell lymphoma of other extranodal and solid organ sites: Secondary | ICD-10-CM | POA: Diagnosis not present

## 2023-03-30 DIAGNOSIS — M25562 Pain in left knee: Secondary | ICD-10-CM | POA: Diagnosis not present

## 2023-03-30 LAB — CMP (CANCER CENTER ONLY)
ALT: 17 U/L (ref 0–44)
AST: 17 U/L (ref 15–41)
Albumin: 4.6 g/dL (ref 3.5–5.0)
Alkaline Phosphatase: 61 U/L (ref 38–126)
Anion gap: 8 (ref 5–15)
BUN: 12 mg/dL (ref 8–23)
CO2: 29 mmol/L (ref 22–32)
Calcium: 10.2 mg/dL (ref 8.9–10.3)
Chloride: 101 mmol/L (ref 98–111)
Creatinine: 0.71 mg/dL (ref 0.44–1.00)
GFR, Estimated: >60 mL/min (ref >60)
Glucose, Bld: 110 mg/dL — ABNORMAL HIGH (ref 70–99)
Potassium: 4.2 mmol/L (ref 3.5–5.1)
Sodium: 138 mmol/L (ref 135–145)
Total Bilirubin: 0.3 mg/dL (ref 0.0–1.2)
Total Protein: 6.8 g/dL (ref 6.5–8.1)

## 2023-03-30 LAB — CBC WITH DIFFERENTIAL (CANCER CENTER ONLY)
Abs Immature Granulocytes: 0.01 10*3/uL (ref 0.00–0.07)
Basophils Absolute: 0 10*3/uL (ref 0.0–0.1)
Basophils Relative: 1 %
Eosinophils Absolute: 0.2 10*3/uL (ref 0.0–0.5)
Eosinophils Relative: 3 %
HCT: 37.5 % (ref 36.0–46.0)
Hemoglobin: 13 g/dL (ref 12.0–15.0)
Immature Granulocytes: 0 %
Lymphocytes Relative: 27 %
Lymphs Abs: 1.6 10*3/uL (ref 0.7–4.0)
MCH: 33.4 pg (ref 26.0–34.0)
MCHC: 34.7 g/dL (ref 30.0–36.0)
MCV: 96.4 fL (ref 80.0–100.0)
Monocytes Absolute: 0.6 10*3/uL (ref 0.1–1.0)
Monocytes Relative: 10 %
Neutro Abs: 3.5 10*3/uL (ref 1.7–7.7)
Neutrophils Relative %: 59 %
Platelet Count: 217 10*3/uL (ref 150–400)
RBC: 3.89 MIL/uL (ref 3.87–5.11)
RDW: 12.1 % (ref 11.5–15.5)
WBC Count: 5.8 10*3/uL (ref 4.0–10.5)
nRBC: 0 % (ref 0.0–0.2)

## 2023-03-30 MED ORDER — SODIUM CHLORIDE 0.9% FLUSH
10.0000 mL | INTRAVENOUS | Status: DC | PRN
  Administered 2023-03-30: 10 mL via INTRAVENOUS

## 2023-03-30 MED ORDER — HEPARIN SOD (PORK) LOCK FLUSH 100 UNIT/ML IV SOLN
500.0000 [IU] | Freq: Once | INTRAVENOUS | Status: AC
  Administered 2023-03-30: 500 [IU] via INTRAVENOUS

## 2023-03-30 NOTE — Progress Notes (Signed)
Hematology and Oncology Follow Up Visit  Ariel [name] [id number] 11/21/1955 68 y.o. 03/30/2023   Principle Diagnosis:  Diffuse large cell non-Hodgkin's lymphoma-CNS involvement -- relapsed   Past Therapy: Status post chemotherapy with R-ICE/R-MTV --  s/p cycle #4 MATRIX -- s/p cycle #4 -- start on 05/02/2021 --completed on 08/06/2021 Neulasta 6 mg subcu post chemotherapy MTV- s/p cycle #1 -- given on 01/22/204   Current Therapy:        Imbruvica/Rituxan -- started 05/02/2022, s/p cycle #5 -DC on 07/16/2022 --DC on 10/29/2022 High-dose methotrexate/Rituxan-cycle #4  -- started on 11/03/2022   Interim History:  Ariel [name] is here today for follow-up.  She looks great.  She feels good.  She sold her house.  She is going be able to drive again.  She is very happy about this.  She really has tolerated treatment incredibly well.  I had to believe that this is worked very nicely for her.  I still feel that CAR-T therapy is going to be needed.  At some point, I would worry about her lymphoma recurrent.  She has had no headache.  She has had no nausea or vomiting.  She has had no fever.  She has had no change in bowel or bladder habits..  She does have some arthritis issues.  She has some pain in her knees.  She has had double hip replacement in the past.  She has had no mouth sores.  Her appetite has been quite good.  Overall, I would say that her performance status is probably ECOG 1.    Medications:  Allergies as of 03/30/2023       Reactions   Decadron [dexamethasone] Other (See Comments)   Leg weakness Hallucinations   Plavix [clopidogrel] Hives, Swelling, Other (See Comments)   Lips became swollen   Prednisone Other (See Comments)   Steroids caused psychological issues  Hallucinations   Sulfa Antibiotics Itching, Rash   Codeine Nausea And Vomiting        Medication List        Accurate as of March 30, 2023  9:11 AM. If you have any questions, ask your nurse or doctor.           acetaminophen 325 MG tablet Commonly known as: TYLENOL Take 650 mg by mouth every 6 (six) hours as needed for headache, fever, moderate pain (pain score 4-6) or mild pain (pain score 1-3).   antiseptic oral rinse Liqd 15 mLs by Mouth Rinse route every 6 (six) hours. What changed:  when to take this reasons to take this   famciclovir 500 MG tablet Commonly known as: FAMVIR Take 500 mg by mouth daily.   fluconazole 100 MG tablet Commonly known as: DIFLUCAN Take 100 mg by mouth daily.   ondansetron 8 MG disintegrating tablet Commonly known as: ZOFRAN-ODT Take 1 tablet (8 mg total) by mouth every 8 (eight) hours as needed for nausea or vomiting.   OVER THE COUNTER MEDICATION Take 2 capsules by mouth daily. Balance of Nature veggies   OVER THE COUNTER MEDICATION Take 2 capsules by mouth daily. Balance of Nature Fruits   sodium bicarbonate/sodium chloride Soln 15 Applications by Mouth Rinse route every 4 (four) hours.        Allergies:  Allergies  Allergen Reactions   Decadron [Dexamethasone] Other (See Comments)    Leg weakness Hallucinations   Plavix [Clopidogrel] Hives, Swelling and Other (See Comments)    Lips became swollen   Prednisone Other (See Comments)  Steroids caused psychological issues  Hallucinations   Sulfa Antibiotics Itching and Rash   Codeine Nausea And Vomiting    Past Medical History, Surgical history, Social history, and Family History were reviewed and updated.  Review of Systems: Review of Systems  Constitutional: Negative.   HENT: Negative.    Eyes: Negative.   Respiratory: Negative.    Cardiovascular: Negative.   Gastrointestinal: Negative.   Genitourinary: Negative.   Musculoskeletal: Negative.   Skin: Negative.   Neurological: Negative.   Endo/Heme/Allergies: Negative.   Psychiatric/Behavioral: Negative.       Physical Exam:  Vital signs are temperature 98.  Pulse 109.  Blood pressure 161/67.  Weight is 166  pounds.    Wt Readings from Last 3 Encounters:  03/05/23 153 lb 7 oz (69.6 kg)  02/15/23 156 lb (70.8 kg)  01/30/23 156 lb (70.8 kg)    Physical Activity: Sufficiently Active (09/05/2022)   Exercise Vital Sign    Days of Exercise per Week: 6 days    Minutes of Exercise per Session: 40 min   Physical Exam Vitals reviewed.  HENT:     Head: Normocephalic and atraumatic.  Eyes:     Pupils: Pupils are equal, round, and reactive to light.  Cardiovascular:     Rate and Rhythm: Normal rate and regular rhythm.     Heart sounds: Normal heart sounds.  Pulmonary:     Effort: Pulmonary effort is normal.     Breath sounds: Normal breath sounds.  Abdominal:     General: Bowel sounds are normal.     Palpations: Abdomen is soft.  Musculoskeletal:        General: No tenderness or deformity. Normal range of motion.     Cervical back: Normal range of motion.  Lymphadenopathy:     Cervical: No cervical adenopathy.  Skin:    General: Skin is warm and dry.     Findings: No erythema or rash.  Neurological:     Mental Status: She is alert and oriented to person, place, and time.  Psychiatric:        Behavior: Behavior normal.        Thought Content: Thought content normal.        Judgment: Judgment normal.     Lab Results  Component Value Date   WBC 5.8 03/30/2023   HGB 13.0 03/30/2023   HCT 37.5 03/30/2023   MCV 96.4 03/30/2023   PLT 217 03/30/2023   Lab Results  Component Value Date   FERRITIN 944 (H) 10/16/2022   IRON 61 10/16/2022   TIBC 277 10/16/2022   UIBC 216 10/16/2022   IRONPCTSAT 22 10/16/2022   Lab Results  Component Value Date   RETICCTPCT 2.4 11/15/2021   RBC 3.89 03/30/2023   No results found for: "KPAFRELGTCHN", "LAMBDASER", "KAPLAMBRATIO" No results found for: "IGGSERUM", "IGA", "IGMSERUM" No results found for: "TOTALPROTELP", "ALBUMINELP", "A1GS", "A2GS", "BETS", "BETA2SER", "GAMS", "MSPIKE", "SPEI"   Chemistry      Component Value Date/Time   NA 137  03/12/2023 1055   K 3.9 03/12/2023 1055   CL 100 03/12/2023 1055   CO2 29 03/12/2023 1055   BUN 22 03/12/2023 1055   CREATININE 0.76 03/12/2023 1055   CREATININE 0.77 02/15/2023 0805   CREATININE 0.68 11/18/2012 1439      Component Value Date/Time   CALCIUM 10.4 (H) 03/12/2023 1055   ALKPHOS 61 03/12/2023 1055   AST 24 03/12/2023 1055   AST 14 (L) 02/15/2023 0805   ALT 96 (H) 03/12/2023  1055   ALT 19 02/15/2023 0805   BILITOT 0.3 03/12/2023 1055   BILITOT 0.3 02/15/2023 0805       Impression and Plan: [name] is a very pleasant 68 yo caucasian female with iron deficiency anemia and then was diagnosed with diffuse large cell non-Hodgkin's lymphoma that had traveled to her brain.  She  has had another relapse.  She had been on Rituxan/IMBRUVICA.  We subsequently had to discontinue this.    We now have her on Rituxan/high-dose of methotrexate.  She has had 4 cycles.  I will go ahead and repeat an MRI.  I still think we probably going to have to admit her.  Even if she is a candidate for CAR-T-that will take quite a while to get that established.  I will have to call [facility] and see what they have to say about her qualify for CAR-T therapy.  We will plan to get her admitted on February 10.  She is was in the hospital for 5 days.  Marland Kitchen    [name], MD 1/31/20259:11 AM

## 2023-03-30 NOTE — Patient Instructions (Signed)
 Implanted [facility] Guide An implanted port is a device that is placed under the skin. It is usually placed in the chest. The device may vary based on the need. Implanted ports can be used to give IV medicine, to take blood, or to give fluids. You may have an implanted port if: You need IV medicine that would be irritating to the small veins in your hands or arms. You need IV medicines, such as chemotherapy, for a long period of time. You need IV nutrition for a long period of time. You may have fewer limitations when using a port than you would if you used other types of long-term IVs. You will also likely be able to return to normal activities after your incision heals. An implanted port has two main parts: Reservoir. The reservoir is the part where a needle is inserted to give medicines or draw blood. The reservoir is round. After the port is placed, it appears as a small, raised area under your skin. Catheter. The catheter is a small, thin tube that connects the reservoir to a vein. Medicine that is inserted into the reservoir goes into the catheter and then into the vein. How is my port accessed? To access your port: A numbing cream may be placed on the skin over the port site. Your health care provider will put on a mask and sterile gloves. The skin over your port will be cleaned carefully with a germ-killing soap and allowed to dry. Your health care provider will gently pinch the port and insert a needle into it. Your health care provider will check for a blood return to make sure the port is in the vein and is still working (patent). If your port needs to remain accessed to get medicine continuously (constant infusion), your health care provider will place a clear bandage (dressing) over the needle site. The dressing and needle will need to be changed every week, or as told by your health care provider. What is flushing? Flushing helps keep the port working. Follow instructions from your  health care provider about how and when to flush the port. Ports are usually flushed with saline solution or a medicine called heparin. The need for flushing will depend on how the port is used: If the port is only used from time to time to give medicines or draw blood, the port may need to be flushed: Before and after medicines have been given. Before and after blood has been drawn. As part of routine maintenance. Flushing may be recommended every 4-6 weeks. If a constant infusion is running, the port may not need to be flushed. Throw away any syringes in a disposal container that is meant for sharp items (sharps container). You can buy a sharps container from a pharmacy, or you can make one by using an empty hard plastic bottle with a cover. How long will my port stay implanted? The port can stay in for as long as your health care provider thinks it is needed. When it is time for the port to come out, a surgery will be done to remove it. The surgery will be similar to the procedure that was done to put the port in. Follow these instructions at home: Caring for your port and port site Flush your port as told by your health care provider. If you need an infusion over several days, follow instructions from your health care provider about how to take care of your port site. Make sure you: Change your  dressing as told by your health care provider. Wash your hands with soap and water for at least 20 seconds before and after you change your dressing. If soap and water are not available, use alcohol-based hand sanitizer. Place any used dressings or infusion bags into a plastic bag. Throw that bag in the trash. Keep the dressing that covers the needle clean and dry. Do not get it wet. Do not use scissors or sharp objects near the infusion tubing. Keep any external tubes clamped, unless they are being used. Check your port site every day for signs of infection. Check for: Redness, swelling, or  pain. Fluid or blood. Warmth. Pus or a bad smell. Protect the skin around the port site. Avoid wearing bra straps that rub or irritate the site. Protect the skin around your port from seat belts. Place a soft pad over your chest if needed. Bathe or shower as told by your health care provider. The site may get wet as long as you are not actively receiving an infusion. General instructions  Return to your normal activities as told by your health care provider. Ask your health care provider what activities are safe for you. Carry a medical alert card or wear a medical alert bracelet at all times. This will let health care providers know that you have an implanted port in case of an emergency. Where to find more information American Cancer Society: www.cancer.org American Society of Clinical Oncology: www.cancer.net Contact a health care provider if: You have a fever or chills. You have redness, swelling, or pain at the port site. You have fluid or blood coming from your port site. Your incision feels warm to the touch. You have pus or a bad smell coming from the port site. Summary Implanted ports are usually placed in the chest for long-term IV access. Follow instructions from your health care provider about flushing the port and changing bandages (dressings). Take care of the area around your port by avoiding clothing that puts pressure on the area, and by watching for signs of infection. Protect the skin around your port from seat belts. Place a soft pad over your chest if needed. Contact a health care provider if you have a fever or you have redness, swelling, pain, fluid, or a bad smell at the port site. This information is not intended to replace advice given to you by your health care provider. Make sure you discuss any questions you have with your health care provider. Document Revised: 08/17/2020 Document Reviewed: 08/17/2020 Elsevier Patient Education  2024 [employer].

## 2023-03-30 NOTE — Telephone Encounter (Signed)
Call placed to [facility] Admitting per order of Dr. Myna Hidalgo to inform them that Dr. Myna Hidalgo would like to admit pt for high dose Methotrexate and Rituxan on 04/09/23.

## 2023-03-31 ENCOUNTER — Encounter (HOSPITAL_COMMUNITY): Payer: Self-pay | Admitting: Hematology & Oncology

## 2023-04-03 ENCOUNTER — Other Ambulatory Visit: Payer: Self-pay

## 2023-04-04 ENCOUNTER — Other Ambulatory Visit: Payer: Self-pay

## 2023-04-04 ENCOUNTER — Ambulatory Visit (HOSPITAL_COMMUNITY)
Admission: RE | Admit: 2023-04-04 | Discharge: 2023-04-04 | Disposition: A | Discharge status: Home / Self Care | Payer: Medicare Other | Source: Ambulatory Visit | Attending: Hematology & Oncology | Admitting: Hematology & Oncology

## 2023-04-04 DIAGNOSIS — G9389 Other specified disorders of brain: Secondary | ICD-10-CM | POA: Diagnosis not present

## 2023-04-04 DIAGNOSIS — C8510 Unspecified B-cell lymphoma, unspecified site: Secondary | ICD-10-CM | POA: Diagnosis not present

## 2023-04-04 DIAGNOSIS — C8590 Non-Hodgkin lymphoma, unspecified, unspecified site: Secondary | ICD-10-CM | POA: Diagnosis not present

## 2023-04-04 DIAGNOSIS — C8332 Diffuse large B-cell lymphoma, intrathoracic lymph nodes: Secondary | ICD-10-CM

## 2023-04-04 DIAGNOSIS — C729 Malignant neoplasm of central nervous system, unspecified: Secondary | ICD-10-CM | POA: Diagnosis not present

## 2023-04-04 MED ORDER — GADOBUTROL 1 MMOL/ML IV SOLN
7.5000 mL | Freq: Once | INTRAVENOUS | Status: AC | PRN
  Administered 2023-04-04: 7.5 mL via INTRAVENOUS

## 2023-04-05 ENCOUNTER — Encounter: Payer: Self-pay | Admitting: Hematology & Oncology

## 2023-04-09 ENCOUNTER — Other Ambulatory Visit: Payer: Self-pay

## 2023-04-09 ENCOUNTER — Encounter (HOSPITAL_COMMUNITY): Payer: Self-pay | Admitting: Hematology & Oncology

## 2023-04-09 ENCOUNTER — Telehealth: Payer: Self-pay

## 2023-04-09 ENCOUNTER — Inpatient Hospital Stay (HOSPITAL_COMMUNITY)
Admission: AD | Admit: 2023-04-09 | Discharge: 2023-04-13 | DRG: 847 | Disposition: A | Discharge status: Home / Self Care | Payer: Medicare Other | Source: Ambulatory Visit | Attending: Hematology & Oncology | Admitting: Hematology & Oncology

## 2023-04-09 DIAGNOSIS — C8332 Diffuse large B-cell lymphoma, intrathoracic lymph nodes: Secondary | ICD-10-CM

## 2023-04-09 DIAGNOSIS — Z85820 Personal history of malignant melanoma of skin: Secondary | ICD-10-CM

## 2023-04-09 DIAGNOSIS — Z803 Family history of malignant neoplasm of breast: Secondary | ICD-10-CM | POA: Diagnosis not present

## 2023-04-09 DIAGNOSIS — Z8673 Personal history of transient ischemic attack (TIA), and cerebral infarction without residual deficits: Secondary | ICD-10-CM

## 2023-04-09 DIAGNOSIS — D509 Iron deficiency anemia, unspecified: Secondary | ICD-10-CM | POA: Diagnosis present

## 2023-04-09 DIAGNOSIS — C8580 Other specified types of non-Hodgkin lymphoma, unspecified site: Principal | ICD-10-CM

## 2023-04-09 DIAGNOSIS — C8339 Primary central nervous system lymphoma: Secondary | ICD-10-CM

## 2023-04-09 DIAGNOSIS — C8330 Diffuse large B-cell lymphoma, unspecified site: Secondary | ICD-10-CM | POA: Diagnosis not present

## 2023-04-09 DIAGNOSIS — Z5111 Encounter for antineoplastic chemotherapy: Secondary | ICD-10-CM | POA: Diagnosis not present

## 2023-04-09 DIAGNOSIS — E876 Hypokalemia: Secondary | ICD-10-CM

## 2023-04-09 DIAGNOSIS — D61818 Other pancytopenia: Secondary | ICD-10-CM

## 2023-04-09 DIAGNOSIS — Z96643 Presence of artificial hip joint, bilateral: Secondary | ICD-10-CM | POA: Diagnosis present

## 2023-04-09 DIAGNOSIS — C7931 Secondary malignant neoplasm of brain: Secondary | ICD-10-CM

## 2023-04-09 DIAGNOSIS — Z882 Allergy status to sulfonamides status: Secondary | ICD-10-CM | POA: Diagnosis not present

## 2023-04-09 DIAGNOSIS — C8510 Unspecified B-cell lymphoma, unspecified site: Secondary | ICD-10-CM

## 2023-04-09 DIAGNOSIS — Z885 Allergy status to narcotic agent status: Secondary | ICD-10-CM | POA: Diagnosis not present

## 2023-04-09 DIAGNOSIS — Z888 Allergy status to other drugs, medicaments and biological substances status: Secondary | ICD-10-CM | POA: Diagnosis not present

## 2023-04-09 DIAGNOSIS — Z9221 Personal history of antineoplastic chemotherapy: Secondary | ICD-10-CM | POA: Diagnosis not present

## 2023-04-09 LAB — URINALYSIS, ROUTINE W REFLEX MICROSCOPIC
Bilirubin Urine: NEGATIVE
Glucose, UA: NEGATIVE mg/dL
Hgb urine dipstick: NEGATIVE
Ketones, ur: NEGATIVE mg/dL
Leukocytes,Ua: NEGATIVE
Nitrite: NEGATIVE
Protein, ur: NEGATIVE mg/dL
Specific Gravity, Urine: 1.014 (ref 1.005–1.030)
pH: 6 (ref 5.0–8.0)

## 2023-04-09 LAB — CBC WITH DIFFERENTIAL/PLATELET
Abs Immature Granulocytes: 0.03 10*3/uL (ref 0.00–0.07)
Basophils Absolute: 0 10*3/uL (ref 0.0–0.1)
Basophils Relative: 1 %
Eosinophils Absolute: 0.1 10*3/uL (ref 0.0–0.5)
Eosinophils Relative: 2 %
HCT: 38.2 % (ref 36.0–46.0)
Hemoglobin: 12.9 g/dL (ref 12.0–15.0)
Immature Granulocytes: 1 %
Lymphocytes Relative: 16 %
Lymphs Abs: 1 10*3/uL (ref 0.7–4.0)
MCH: 33.1 pg (ref 26.0–34.0)
MCHC: 33.8 g/dL (ref 30.0–36.0)
MCV: 97.9 fL (ref 80.0–100.0)
Monocytes Absolute: 0.5 10*3/uL (ref 0.1–1.0)
Monocytes Relative: 8 %
Neutro Abs: 4.6 10*3/uL (ref 1.7–7.7)
Neutrophils Relative %: 72 %
Platelets: 238 10*3/uL (ref 150–400)
RBC: 3.9 MIL/uL (ref 3.87–5.11)
RDW: 12.2 % (ref 11.5–15.5)
WBC: 6.3 10*3/uL (ref 4.0–10.5)
nRBC: 0 % (ref 0.0–0.2)

## 2023-04-09 LAB — COMPREHENSIVE METABOLIC PANEL
ALT: 22 U/L (ref 0–44)
AST: 21 U/L (ref 15–41)
Albumin: 4.2 g/dL (ref 3.5–5.0)
Alkaline Phosphatase: 54 U/L (ref 38–126)
Anion gap: 11 (ref 5–15)
BUN: 21 mg/dL (ref 8–23)
CO2: 26 mmol/L (ref 22–32)
Calcium: 9.9 mg/dL (ref 8.9–10.3)
Chloride: 101 mmol/L (ref 98–111)
Creatinine, Ser: 0.74 mg/dL (ref 0.44–1.00)
GFR, Estimated: >60 mL/min (ref >60)
Glucose, Bld: 106 mg/dL — ABNORMAL HIGH (ref 70–99)
Potassium: 3.9 mmol/L (ref 3.5–5.1)
Sodium: 138 mmol/L (ref 135–145)
Total Bilirubin: 0.5 mg/dL (ref 0.0–1.2)
Total Protein: 7 g/dL (ref 6.5–8.1)

## 2023-04-09 LAB — HIV ANTIBODY (ROUTINE TESTING W REFLEX): HIV Screen 4th Generation wRfx: NONREACTIVE

## 2023-04-09 LAB — MAGNESIUM: Magnesium: 2.1 mg/dL (ref 1.7–2.4)

## 2023-04-09 MED ORDER — ALUM & MAG HYDROXIDE-SIMETH 200-200-20 MG/5ML PO SUSP: 60.0000 mL | ORAL | Status: DC | PRN

## 2023-04-09 MED ORDER — SODIUM BICARBONATE 8.4 % IV SOLN
INTRAVENOUS | Status: DC
Start: 2023-04-09 — End: 2023-04-10
  Filled 2023-04-09: qty 1000
  Filled 2023-04-09: qty 150
  Filled 2023-04-09 (×2): qty 1000

## 2023-04-09 MED ORDER — ACETAMINOPHEN 325 MG PO TABS
650.0000 mg | ORAL_TABLET | Freq: Once | ORAL | Status: AC
  Administered 2023-04-09: 650 mg via ORAL
  Filled 2023-04-09: qty 2

## 2023-04-09 MED ORDER — SODIUM CHLORIDE 0.9 % IV SOLN: 500.0000 mg/m2 | Freq: Once | INTRAVENOUS | Status: DC

## 2023-04-09 MED ORDER — SODIUM BICARBONATE/SODIUM CHLORIDE MOUTHWASH
OROMUCOSAL | Status: DC
  Administered 2023-04-12 – 2023-04-13 (×2): 1 via OROMUCOSAL
  Filled 2023-04-09: qty 1000

## 2023-04-09 MED ORDER — GUAIFENESIN-DM 100-10 MG/5ML PO SYRP
10.0000 mL | ORAL_SOLUTION | ORAL | Status: DC | PRN
Start: 2023-04-09 — End: 2023-04-13

## 2023-04-09 MED ORDER — SODIUM CHLORIDE 0.9 % IV SOLN: 8.0000 mg | Freq: Three times a day (TID) | INTRAVENOUS | Status: DC | PRN

## 2023-04-09 MED ORDER — SODIUM BICARBONATE 8.4 % IV SOLN: INTRAVENOUS | Status: DC

## 2023-04-09 MED ORDER — HYDROCORTISONE (PERIANAL) 2.5 % EX CREA: 1.0000 | TOPICAL_CREAM | Freq: Two times a day (BID) | CUTANEOUS | Status: DC | PRN

## 2023-04-09 MED ORDER — ONDANSETRON HCL 4 MG PO TABS: 4.0000 mg | ORAL_TABLET | Freq: Three times a day (TID) | ORAL | Status: DC | PRN

## 2023-04-09 MED ORDER — ONDANSETRON HCL 4 MG/2ML IJ SOLN: 4.0000 mg | Freq: Three times a day (TID) | INTRAMUSCULAR | Status: DC | PRN

## 2023-04-09 MED ORDER — DIPHENHYDRAMINE HCL 25 MG PO CAPS
25.0000 mg | ORAL_CAPSULE | Freq: Once | ORAL | Status: AC
  Administered 2023-04-09: 25 mg via ORAL
  Filled 2023-04-09: qty 1

## 2023-04-09 MED ORDER — SODIUM CHLORIDE 0.9 % IV SOLN: INTRAVENOUS | Status: DC

## 2023-04-09 MED ORDER — ONDANSETRON 4 MG PO TBDP
4.0000 mg | ORAL_TABLET | Freq: Three times a day (TID) | ORAL | Status: DC | PRN
  Administered 2023-04-10 – 2023-04-11 (×2): 4 mg via ORAL
  Filled 2023-04-09 (×2): qty 1

## 2023-04-09 MED ORDER — ENOXAPARIN SODIUM 40 MG/0.4ML IJ SOSY
40.0000 mg | PREFILLED_SYRINGE | INTRAMUSCULAR | Status: DC
  Administered 2023-04-09 – 2023-04-10 (×2): 40 mg via SUBCUTANEOUS
  Filled 2023-04-09 (×2): qty 0.4

## 2023-04-09 MED ORDER — SODIUM CHLORIDE 0.9 % IV SOLN
500.0000 mg/m2 | Freq: Once | INTRAVENOUS | Status: AC
Start: 2023-04-09 — End: 2023-04-09
  Administered 2023-04-09: 900 mg via INTRAVENOUS
  Filled 2023-04-09: qty 90

## 2023-04-09 MED ORDER — ACETAMINOPHEN 325 MG PO TABS
650.0000 mg | ORAL_TABLET | ORAL | Status: DC | PRN
  Administered 2023-04-10 – 2023-04-13 (×5): 650 mg via ORAL
  Filled 2023-04-09 (×5): qty 2

## 2023-04-09 MED ORDER — SENNOSIDES-DOCUSATE SODIUM 8.6-50 MG PO TABS: 1.0000 | ORAL_TABLET | Freq: Every evening | ORAL | Status: DC | PRN

## 2023-04-09 NOTE — Telephone Encounter (Signed)
 Dual verified chemotherapy with RN for administration of subsequent 900 mg rituximab  infusion. Verbally confirmed with patient their name and birthday.  Expiration date, name, rate, drug dose, and route verified with RN.

## 2023-04-09 NOTE — Progress Notes (Signed)
   04/09/23 1558  TOC Brief Assessment  Insurance and Status Reviewed  Patient has primary care physician Yes Fleming Hum, Lovett Ruck, NP as PCP)  Home environment has been reviewed [facility]  Prior level of function: Independent  Prior/Current Home Services No current home services  Social Drivers of Health Review SDOH reviewed no interventions necessary  Readmission risk has been reviewed Yes  Transition of care needs no transition of care needs at this time

## 2023-04-09 NOTE — Progress Notes (Signed)
 Patient will receive Ruxience  today and Methotrexate  on 04/10/23 once urine pH is appropriate.  Ariel [name] [place], [place], BCPS, BCOP 04/09/2023

## 2023-04-09 NOTE — H&P (Addendum)
 History and Physical   Ariel [name] [id number] 1955-07-22 68 y.o. 04/09/2023   HPI: Ariel [name] 68 y.o. female with history of recurrent lymphoma of the brain.  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  Patient has had prior chemotherapy with rituximab  and high-dose methotrexate  for which she responded very well.  Patient is now being admitted for 5th cycle of chemotherapy under the management of Dr. Maria Shiner. Patient seen and assessed today eating lunch.   Patient reports that she feels well and indeed looks great.  Denies pain, nausea, vomiting, GI symptoms.  Very pleasant lovely lady. Oncologic medical history is significant for melanoma diagnosed in 2021 stage Ib.  Then in 2022 patient was diagnosed with high-grade B cell lymphoma for which she started chemotherapy and continued in 2024. Surgical history includes 2 hip replacement surgeries, first one August 2024 and second one October 2024.  Also status post cataract surgery.   Family history significant for sister with breast cancer.   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  Denies alcohol use, tobacco use, illicit drug use.  Patient reports healthy lifestyle that she is independent of ADLs, exercises regularly by walking and doing yoga.     I have reviewed her chart and materials related to her cancer extensively and collaborated history with the patient. Summary of oncologic history is as follows:    Oncology History  Melanoma (HCC)  2021 Initial Diagnosis    Melanoma (HCC)    12/01/2020 Cancer Staging    Staging form: Melanoma of the Skin, AJCC 8th Edition - Clinical stage from 12/01/2020: Stage IB (cT1b, cN0, cM0) - Signed by Ivor Mars, MD on 12/01/2020    High grade B-cell lymphoma (HCC)  12/22/2020 Initial Diagnosis    High grade B-cell lymphoma (HCC)    01/04/2021 - 01/10/2021 Chemotherapy    Patient is on Treatment Plan : NON-HODGKINS  LYMPHOMA R-ICE q21d     01/25/2021 - 02/02/2021 Chemotherapy    Patient is on Treatment Plan : IP NON-HODGKINS LYMPHOMA R-MTV q21d     03/03/2021 - 03/03/2021 Chemotherapy    Patient is on Treatment Plan : IP NON-HODGKINS LYMPHOMA RICE q21d     03/08/2021 - 03/14/2021 Chemotherapy    Patient is on Treatment Plan : IP NON-HODGKINS LYMPHOMA RICE q21d     04/04/2021 - 08/05/2021 Chemotherapy    Patient is on Treatment Plan : IP R-MTV x 1 then IP MATRix q21d starting 05/02/21     03/21/2022 - 03/28/2022 Chemotherapy    Patient is on Treatment Plan : IP NON-HODGKINS LYMPHOMA R-MTV q21d     05/03/2022 - 06/29/2022 Chemotherapy    Patient is on Treatment Plan : ITP Rituximab  q7d x 4 cycles     11/03/2022 -  Chemotherapy    Patient is on Treatment Plan : IP NON-HODGKINS LYMPHOMA High Dose Methotrexate  + Leucovorin  Rescue     Diffuse large B cell lymphoma (HCC)  03/07/2021 Initial Diagnosis    Diffuse large B cell lymphoma (HCC)    04/04/2021 - 08/05/2021 Chemotherapy    Patient is on Treatment Plan : IP R-MTV x 1 then IP MATRix q21d starting 05/02/21     03/21/2022 - 03/28/2022 Chemotherapy    Patient is on Treatment Plan : IP NON-HODGKINS LYMPHOMA R-MTV q21d     05/03/2022 - 06/29/2022 Chemotherapy    Patient is on Treatment Plan : ITP Rituximab  q7d x 4 cycles     11/03/2022 -  Chemotherapy    Patient is on Treatment Plan : IP NON-HODGKINS LYMPHOMA High Dose Methotrexate  + Leucovorin  Rescue       Assessment and Plan: 1.  Recurrent high-grade B-cell lymphoma - Initially diagnosed 2022 - Patient is now admitted for C5 Rituxan /high-dose methotrexate  +leucovorin  rescue.  Plan for 5 days hospitalization.  This regimen was started on 11/03/2022.   - Must monitor urine pH daily, urine pH to remain above 8. - Patient seen at outpatient visit with Dr. Maria Shiner on 03/30/2023.  Patient was feeling well with no complaints offered at that time. - Medical oncology/Dr. Maria Shiner following closely   2.  History of melanoma, right  hip - Initially diagnosed 2021, stage Ib.  Patient states that she a mole surgically removed from her right hip in 2021. - Medical oncology/Dr. Maria Shiner following closely   3.  History of iron deficiency anemia - Monitor CBC with differential closely - CBC with differential done today and is stable. -Will continue to monitor iron levels as outpatient. - Dr. Maria Shiner following closely  PMH: Past Medical History:  Diagnosis Date   Allergy    Arthritis    Cataract    Chicken pox    Complication of anesthesia    Per patient, very slow to wake up after anesthesia   Goals of care, counseling/discussion 12/22/2020   High cholesterol    High grade B-cell lymphoma (HCC) 12/22/2020   Hyperglycemia 11/10/2020   Melanoma (HCC) 2021   right hip   Melanoma (HCC) 10/28/2020   pt stated brain liver and bladder   PONV (postoperative nausea and vomiting)    Stroke [facility])    Urinary incontinence     Past Surgical History:  Procedure Laterality Date   AIR/FLUID EXCHANGE Right 12/07/2020   Procedure: AIR/FLUID EXCHANGE;  Surgeon: Jearline Minder, MD;  Location: [facility] OR;  Service: Ophthalmology;  Laterality: Right;   APPENDECTOMY  1962   BIOPSY  12/14/2020   Procedure: BIOPSY;  Surgeon: Brice Campi Albino Alu., MD;  Location: [facility] ENDOSCOPY;  Service: Gastroenterology;;   ESOPHAGOGASTRODUODENOSCOPY (EGD) WITH PROPOFOL  N/A 12/14/2020   Procedure: ESOPHAGOGASTRODUODENOSCOPY (EGD) WITH PROPOFOL ;  Surgeon: Normie Becton., MD;  Location: [facility] ENDOSCOPY;  Service: Gastroenterology;  Laterality: N/A;   EXCISION MELANOMA WITH SENTINEL LYMPH NODE BIOPSY Right 10/09/2019   Procedure: WIDE LOCAL EXCISION WITH ADVANCEMENT FLAP CLOSURE RIGHT HIP MELANOMA WITH SENTINEL LYMPH NODE BIOPSY;  Surgeon: Lockie Rima, MD;  Location: [facility] OR;  Service: General;  Laterality: Right;   EYE SURGERY     FINE NEEDLE ASPIRATION  12/14/2020   Procedure: FINE NEEDLE ASPIRATION (FNA) LINEAR;  Surgeon: Normie Becton.,  MD;  Location: [facility] ENDOSCOPY;  Service: Gastroenterology;;   HERNIA REPAIR  2009   IR BONE MARROW BIOPSY & ASPIRATION  05/08/2022   IR IMAGING GUIDED PORT INSERTION  12/30/2020   JOINT REPLACEMENT  2023   MOUTH SURGERY  11/2015   gum surgery and bone implant   PARS PLANA VITRECTOMY Right 12/07/2020   Procedure: PARS PLANA VITRECTOMY WITH 25 GAUGE;  Surgeon: Jearline Minder, MD;  Location[facility] OR;  Service: Ophthalmology;  Laterality: Right;   PHOTOCOAGULATION WITH LASER Right 12/07/2020   Procedure: PHOTOCOAGULATION WITH ENDOLASER PANRITINAL COAGULATION;  Surgeon: Jearline Minder, MD;  Location: [facility] OR;  Service: Ophthalmology;  Laterality: Right;   TOTAL HIP ARTHROPLASTY Left 10/11/2021   Procedure: LEFT TOTAL HIP ARTHROPLASTY ANTERIOR APPROACH;  Surgeon: Dayne Even, MD;  Location: [facility] ORS;  Service: Orthopedics;  Laterality: Left;   TOTAL HIP ARTHROPLASTY Right  01/10/2022   Procedure: RIGHT TOTAL HIP ARTHROPLASTY ANTERIOR APPROACH;  Surgeon: Dayne Even, MD;  Location: [facility] ORS;  Service: Orthopedics;  Laterality: Right;   UPPER ESOPHAGEAL ENDOSCOPIC ULTRASOUND (EUS) N/A 12/14/2020   Procedure: UPPER ESOPHAGEAL ENDOSCOPIC ULTRASOUND (EUS);  Surgeon: Normie Becton., MD;  Location: [facility] ENDOSCOPY;  Service: Gastroenterology;  Laterality: N/A;   WISDOM TOOTH EXTRACTION      Allergies:  Allergies  Allergen Reactions   Decadron  [Dexamethasone ] Other (See Comments)    Leg weakness Hallucinations   Plavix  [Clopidogrel ] Hives, Swelling and Other (See Comments)    Lips became swollen   Prednisone Other (See Comments)    Steroids caused psychological issues  Hallucinations   Sulfa  Antibiotics Itching and Rash   Codeine Nausea And Vomiting    Medications:  Medications Prior to Admission  Medication Sig Dispense Refill   acetaminophen  (TYLENOL ) 500 MG tablet Take 1,000 mg by mouth every 6 (six) hours as needed for headache, fever, moderate pain (pain score 4-6) or mild pain  (pain score 1-3).     antiseptic oral rinse (BIOTENE) LIQD 15 mLs by Mouth Rinse route every 6 (six) hours. (Patient taking differently: 15 mLs by Mouth Rinse route every 6 (six) hours as needed for dry mouth.) 500 mL 4   famciclovir  (FAMVIR ) 500 MG tablet Take 500 mg by mouth daily.     fluconazole  (DIFLUCAN ) 100 MG tablet Take 100 mg by mouth daily.     ondansetron  (ZOFRAN -ODT) 8 MG disintegrating tablet Take 1 tablet (8 mg total) by mouth every 8 (eight) hours as needed for nausea or vomiting. 30 tablet 1   OVER THE COUNTER MEDICATION Take 2 capsules by mouth daily. Balance of Nature veggies     OVER THE COUNTER MEDICATION Take 2 capsules by mouth daily. Balance of Nature Fruits     amoxicillin  (AMOXIL ) 500 MG capsule Take 500 mg by mouth every 6 (six) hours. (Patient not taking: Reported on 04/09/2023)      REVIEW OF SYSTEMS:   Constitutional: Denies fevers, chills or abnormal night sweats Eyes: Denies blurriness of vision, double vision or watery eyes Ears, nose, mouth, throat, and face: Denies mucositis or sore throat Respiratory: Denies cough, dyspnea or wheezes Cardiovascular: Denies palpitation, chest discomfort or lower extremity swelling Gastrointestinal: Denies nausea, heartburn or change in bowel habits Skin: Denies abnormal skin rashes Lymphatics: Denies new lymphadenopathy or easy bruising Neurological: Denies numbness, tingling or new weaknesses Behavioral/Psych: Mood is stable, no new changes  All other systems were reviewed with the patient and are negative.  PHYSICAL EXAMINATION: ECOG PERFORMANCE STATUS: 0 - Asymptomatic  Vitals:   04/09/23 0957  BP: 125/62  Pulse: 73  Resp: 16  Temp: 97.8 F (36.6 C)  SpO2: 99%   There were no vitals filed for this visit.  GENERAL: alert, no distress and comfortable SKIN: skin color, texture, turgor are normal, no rashes or significant lesions EYES: normal, conjunctiva are pink and non-injected, sclera clear OROPHARYNX: no  exudate, no erythema and lips, buccal mucosa, and tongue normal  NECK: supple, thyroid  normal size, non-tender, without nodularity LYMPH: no palpable lymphadenopathy in the cervical, axillary or inguinal LUNGS: clear to auscultation and percussion with normal breathing effort HEART: regular rate & rhythm and no murmurs and no lower extremity edema ABDOMEN: abdomen soft, non-tender and normal bowel sounds MUSCULOSKELETAL: no cyanosis of digits and no clubbing  PSYCH: alert & oriented x 3 with fluent speech NEURO: no focal motor/sensory deficits   Lab Results:  Lab Results  Component Value Date   WBC 6.3 04/09/2023   HGB 12.9 04/09/2023   HCT 38.2 04/09/2023   MCV 97.9 04/09/2023   PLT 238 04/09/2023   NEUTROABS 4.6 04/09/2023    @LASTCHEMISTRY @  Radiological Studies:  No results found.    The total time spent in the appointment was 55 minutes encounter with patient including review of chart and various tests results, discussions about plan of care and coordination of care plan     Annemarie Barry Rouson, NP  04/09/2023, 11:30 AM

## 2023-04-10 ENCOUNTER — Encounter (HOSPITAL_COMMUNITY): Payer: Self-pay | Admitting: Hematology & Oncology

## 2023-04-10 DIAGNOSIS — C8580 Other specified types of non-Hodgkin lymphoma, unspecified site: Secondary | ICD-10-CM | POA: Diagnosis not present

## 2023-04-10 LAB — COMPREHENSIVE METABOLIC PANEL
ALT: 20 U/L (ref 0–44)
AST: 21 U/L (ref 15–41)
Albumin: 3.6 g/dL (ref 3.5–5.0)
Alkaline Phosphatase: 47 U/L (ref 38–126)
Anion gap: 8 (ref 5–15)
BUN: 10 mg/dL (ref 8–23)
CO2: 33 mmol/L — ABNORMAL HIGH (ref 22–32)
Calcium: 9 mg/dL (ref 8.9–10.3)
Chloride: 97 mmol/L — ABNORMAL LOW (ref 98–111)
Creatinine, Ser: 0.55 mg/dL (ref 0.44–1.00)
GFR, Estimated: >60 mL/min (ref >60)
Glucose, Bld: 138 mg/dL — ABNORMAL HIGH (ref 70–99)
Potassium: 3.3 mmol/L — ABNORMAL LOW (ref 3.5–5.1)
Sodium: 138 mmol/L (ref 135–145)
Total Bilirubin: 0.3 mg/dL (ref 0.0–1.2)
Total Protein: 5.9 g/dL — ABNORMAL LOW (ref 6.5–8.1)

## 2023-04-10 LAB — URINALYSIS, DIPSTICK ONLY
Bilirubin Urine: NEGATIVE
Glucose, UA: NEGATIVE mg/dL
Hgb urine dipstick: NEGATIVE
Ketones, ur: NEGATIVE mg/dL
Leukocytes,Ua: NEGATIVE
Nitrite: NEGATIVE
Protein, ur: NEGATIVE mg/dL
Specific Gravity, Urine: 1.009 (ref 1.005–1.030)
pH: 9 — ABNORMAL HIGH (ref 5.0–8.0)

## 2023-04-10 LAB — CBC
HCT: 35.2 % — ABNORMAL LOW (ref 36.0–46.0)
Hemoglobin: 11.8 g/dL — ABNORMAL LOW (ref 12.0–15.0)
MCH: 32.6 pg (ref 26.0–34.0)
MCHC: 33.5 g/dL (ref 30.0–36.0)
MCV: 97.2 fL (ref 80.0–100.0)
Platelets: 205 10*3/uL (ref 150–400)
RBC: 3.62 MIL/uL — ABNORMAL LOW (ref 3.87–5.11)
RDW: 12.1 % (ref 11.5–15.5)
WBC: 5.3 10*3/uL (ref 4.0–10.5)
nRBC: 0 % (ref 0.0–0.2)

## 2023-04-10 MED ORDER — METHOTREXATE SODIUM (PF) CHEMO INJECTION 1 GM/40ML
4.0000 g/m2 | Freq: Once | INTRAVENOUS | Status: AC
  Administered 2023-04-10: 7.2 g via INTRAVENOUS
  Filled 2023-04-10: qty 288

## 2023-04-10 MED ORDER — BIOTENE DRY MOUTH MT LIQD
15.0000 mL | Freq: Four times a day (QID) | OROMUCOSAL | Status: DC
  Administered 2023-04-10 – 2023-04-12 (×6): 15 mL via OROMUCOSAL

## 2023-04-10 MED ORDER — DEXTROSE 5 % IV SOLN
INTRAVENOUS | Status: DC
  Filled 2023-04-10 (×2): qty 150
  Filled 2023-04-10: qty 1000
  Filled 2023-04-10: qty 150
  Filled 2023-04-10 (×2): qty 1000
  Filled 2023-04-10 (×2): qty 150
  Filled 2023-04-10 (×2): qty 1000
  Filled 2023-04-10: qty 150
  Filled 2023-04-10: qty 1000

## 2023-04-10 MED ORDER — SODIUM CHLORIDE 0.9 % IV SOLN
Freq: Once | INTRAVENOUS | Status: AC
  Administered 2023-04-10: 16 mg via INTRAVENOUS
  Filled 2023-04-10: qty 8

## 2023-04-10 MED ORDER — CHLORHEXIDINE GLUCONATE CLOTH 2 % EX PADS
6.0000 | MEDICATED_PAD | Freq: Every day | CUTANEOUS | Status: DC
  Administered 2023-04-10 – 2023-04-13 (×4): 6 via TOPICAL

## 2023-04-10 MED ORDER — SODIUM CHLORIDE 0.9% FLUSH
10.0000 mL | INTRAVENOUS | Status: DC | PRN
  Administered 2023-04-10: 10 mL

## 2023-04-10 MED ORDER — SODIUM CHLORIDE 0.9% FLUSH
10.0000 mL | Freq: Two times a day (BID) | INTRAVENOUS | Status: DC
  Administered 2023-04-10 – 2023-04-13 (×7): 10 mL

## 2023-04-10 NOTE — Plan of Care (Signed)
Problem: Education: Goal: Knowledge of General Education information will improve Description: Including pain rating scale, medication(s)/side effects and non-pharmacologic comfort measures Outcome: Progressing   Problem: Health Behavior/Discharge Planning: Goal: Ability to manage health-related needs will improve Outcome: Progressing   Problem: Clinical Measurements: Goal: Ability to maintain clinical measurements within normal limits will improve Outcome: Progressing Goal: Will remain free from infection Outcome: Progressing Goal: Diagnostic test results will improve Outcome: Progressing Goal: Respiratory complications will improve Outcome: Progressing Goal: Cardiovascular complication will be avoided Outcome: Progressing   Problem: Activity: Goal: Risk for activity intolerance will decrease Outcome: Progressing   Problem: Nutrition: Goal: Adequate nutrition will be maintained Outcome: Progressing   Problem: Coping: Goal: Level of anxiety will decrease Outcome: Progressing   Problem: Elimination: Goal: Will not experience complications related to bowel motility Outcome: Progressing Goal: Will not experience complications related to urinary retention Outcome: Progressing   Problem: Pain Managment: Goal: General experience of comfort will improve and/or be controlled Outcome: Progressing   Problem: Safety: Goal: Ability to remain free from injury will improve Outcome: Progressing   Problem: Skin Integrity: Goal: Risk for impaired skin integrity will decrease Outcome: Progressing   Problem: Education: Goal: Knowledge of the prescribed therapeutic regimen will improve Outcome: Progressing   Problem: Activity: Goal: Ability to implement measures to reduce episodes of fatigue will improve Outcome: Progressing   Problem: Bowel/Gastric: Goal: Will not experience complications related to bowel motility Outcome: Progressing   Problem: Coping: Goal: Ability to  identify and develop effective coping behavior will improve Outcome: Progressing   Problem: Nutritional: Goal: Maintenance of adequate nutrition will improve Outcome: Progressing

## 2023-04-10 NOTE — Plan of Care (Signed)
Problem: Clinical Measurements: Goal: Will remain free from infection Outcome: Progressing Goal: Diagnostic test results will improve Outcome: Progressing   Problem: Safety: Goal: Ability to remain free from injury will improve Outcome: Progressing

## 2023-04-10 NOTE — Progress Notes (Signed)
Ariel [name] is doing well.  She feels well.  She got a Rituxan yesterday.  Her urine is alkalized with a pH of 9.  She will get her methotrexate today and then the leucovorin rescue.  She has had a little bit of joint pain.  There is been no nausea or vomiting.  She has had no diarrhea.  She has had no headache.  There is no visual changes.  There is no cough or shortness of breath.  She has had no rashes.  Her labs show sodium 138.  Potassium 3.3.  BUN 10 creatinine 0.55.  Blood sugar 138.  Her bilirubin 0.3.  Her white cell count is 5.3.  Hemoglobin 11.8.  Platelet count 205,000.  She has had no mouth sores.  She is rinsing her mouth out.  Overall, I would say performance status is ECOG 0.  Her vital signs with temperature of 98.2.  Pulse 93.  Blood pressure 116/57.  Head and neck exam shows no ocular or oral lesions.  There are no palpable cervical or supraclavicular lymph nodes.  Lungs are clear bilaterally.  She has good air movement bilaterally.  Cardiac exam regular rate and rhythm.  Abdomen is soft.  Bowel sounds are present.  There is no guarding or rebound tenderness.  Neurological exam shows no focal neurological deficits.  Extremity shows no clubbing, cyanosis or edema.     We will go ahead with methotrexate today.  This will be her last dose of methotrexate.  She is still not decided about CAR-T therapy.  I really think this is the best way to try to help her in the long-term.  We will continue to monitor her labs.  We will we will watch her potassium.  I will not give her any potassium today.  I do appreciate the great care she is getting from everybody on 6 E.   Christin Bach, MD  Psalm 25:10

## 2023-04-10 NOTE — Progress Notes (Addendum)
Subjective:   Patient is a 68 y.o. female admitted on 04/09/2023 for chemotherapy with Lymphoma on last round of Methotrexate with Leucovorin rescue.    Administered pre meds via PIV.    Administered Methotrexate 7.2 mg IV in Dextrose 5% via Port.  Blood return noted.  Sodium Bicarb running per orders via PIV.    Patient complaints: none  Symptoms are stable during the past 24 hours.   Assessment:   Principal Problem:   Lymphoma malignant, large cell (HCC)

## 2023-04-11 DIAGNOSIS — C8580 Other specified types of non-Hodgkin lymphoma, unspecified site: Secondary | ICD-10-CM | POA: Diagnosis not present

## 2023-04-11 LAB — COMPREHENSIVE METABOLIC PANEL WITH GFR
ALT: 45 U/L — ABNORMAL HIGH (ref 0–44)
AST: 35 U/L (ref 15–41)
Albumin: 3.4 g/dL — ABNORMAL LOW (ref 3.5–5.0)
Alkaline Phosphatase: 49 U/L (ref 38–126)
Anion gap: 7 (ref 5–15)
BUN: 12 mg/dL (ref 8–23)
CO2: 35 mmol/L — ABNORMAL HIGH (ref 22–32)
Calcium: 8.8 mg/dL — ABNORMAL LOW (ref 8.9–10.3)
Chloride: 92 mmol/L — ABNORMAL LOW (ref 98–111)
Creatinine, Ser: 0.66 mg/dL (ref 0.44–1.00)
GFR, Estimated: >60 mL/min
Glucose, Bld: 110 mg/dL — ABNORMAL HIGH (ref 70–99)
Potassium: 3 mmol/L — ABNORMAL LOW (ref 3.5–5.1)
Sodium: 134 mmol/L — ABNORMAL LOW (ref 135–145)
Total Bilirubin: 0.6 mg/dL (ref 0.0–1.2)
Total Protein: 5.5 g/dL — ABNORMAL LOW (ref 6.5–8.1)

## 2023-04-11 LAB — CBC WITH DIFFERENTIAL/PLATELET
Abs Immature Granulocytes: 0.02 10*3/uL (ref 0.00–0.07)
Basophils Absolute: 0 10*3/uL (ref 0.0–0.1)
Basophils Relative: 0 %
Eosinophils Absolute: 0.1 10*3/uL (ref 0.0–0.5)
Eosinophils Relative: 2 %
HCT: 34.3 % — ABNORMAL LOW (ref 36.0–46.0)
Hemoglobin: 11.6 g/dL — ABNORMAL LOW (ref 12.0–15.0)
Immature Granulocytes: 0 %
Lymphocytes Relative: 18 %
Lymphs Abs: 1.1 10*3/uL (ref 0.7–4.0)
MCH: 32.9 pg (ref 26.0–34.0)
MCHC: 33.8 g/dL (ref 30.0–36.0)
MCV: 97.2 fL (ref 80.0–100.0)
Monocytes Absolute: 0.6 10*3/uL (ref 0.1–1.0)
Monocytes Relative: 11 %
Neutro Abs: 4.1 10*3/uL (ref 1.7–7.7)
Neutrophils Relative %: 69 %
Platelets: 189 10*3/uL (ref 150–400)
RBC: 3.53 MIL/uL — ABNORMAL LOW (ref 3.87–5.11)
RDW: 11.9 % (ref 11.5–15.5)
WBC: 5.9 10*3/uL (ref 4.0–10.5)
nRBC: 0 % (ref 0.0–0.2)

## 2023-04-11 LAB — URINALYSIS, DIPSTICK ONLY
Bilirubin Urine: NEGATIVE
Glucose, UA: NEGATIVE mg/dL
Hgb urine dipstick: NEGATIVE
Ketones, ur: NEGATIVE mg/dL
Leukocytes,Ua: NEGATIVE
Nitrite: NEGATIVE
Protein, ur: NEGATIVE mg/dL
Specific Gravity, Urine: 1.008 (ref 1.005–1.030)
pH: 9 — ABNORMAL HIGH (ref 5.0–8.0)

## 2023-04-11 LAB — METHOTREXATE: Methotrexate: 5.83

## 2023-04-11 MED ORDER — SODIUM CHLORIDE 0.9 % IV SOLN
Freq: Once | INTRAVENOUS | Status: AC
  Administered 2023-04-11: 16 mg via INTRAVENOUS
  Filled 2023-04-11: qty 8

## 2023-04-11 MED ORDER — KETOROLAC TROMETHAMINE 30 MG/ML IJ SOLN: 15.0000 mg | Freq: Three times a day (TID) | INTRAMUSCULAR | Status: DC

## 2023-04-11 MED ORDER — LEUCOVORIN CALCIUM INJECTION 100 MG
15.0000 mg | Freq: Four times a day (QID) | INTRAMUSCULAR | Status: AC
Start: 2023-04-11 — End: 2023-04-12
  Administered 2023-04-11 – 2023-04-12 (×4): 16 mg via INTRAVENOUS
  Filled 2023-04-11 (×8): qty 0.8

## 2023-04-11 MED ORDER — ONDANSETRON HCL 4 MG/2ML IJ SOLN: 4.0000 mg | Freq: Three times a day (TID) | INTRAMUSCULAR | Status: DC

## 2023-04-11 MED ORDER — ONDANSETRON 4 MG PO TBDP
4.0000 mg | ORAL_TABLET | Freq: Three times a day (TID) | ORAL | Status: DC
  Administered 2023-04-12 – 2023-04-13 (×2): 4 mg via ORAL
  Filled 2023-04-11 (×3): qty 1

## 2023-04-11 MED ORDER — ONDANSETRON HCL 4 MG PO TABS
4.0000 mg | ORAL_TABLET | Freq: Three times a day (TID) | ORAL | Status: DC
Start: 2023-04-11 — End: 2023-04-13
  Administered 2023-04-13: 4 mg via ORAL

## 2023-04-11 MED ORDER — SODIUM CHLORIDE 0.9 % IV SOLN
8.0000 mg | Freq: Three times a day (TID) | INTRAVENOUS | Status: DC
  Administered 2023-04-11 – 2023-04-12 (×3): 8 mg via INTRAVENOUS
  Filled 2023-04-11 (×8): qty 4

## 2023-04-11 NOTE — Progress Notes (Signed)
Ms. Neyer got her methotrexate.  She did well with this.  Now, we will have to see about side effects.  I will go ahead and get her on some scheduled Zofran.  She did not eat much yesterday.  She feels more hungry today.  She has had no diarrhea.  There is been no problems with urine.  She has had no cough or shortness of breath.  She has had no mouth sores.  There has been no headache.  Her labs show sodium 134.  Potassium 3.0.  BUN 12 creatinine 0.66.  Calcium 8.8 with an albumin of 3.4.  Her white blood cell count is 5.9.  Hemoglobin 11.6.  Platelet count 189,000.  She has had no bleeding.  She has had no rashes.  There is been no leg swelling.  Her vital signs are temperature 98.6.  Pulse 84.  Blood pressure 100/58.  Head and neck exam shows no ocular or oral lesions.  She has no palpable cervical or supraclavicular lymph nodes.  Lungs are clear bilaterally.  Cardiac exam regular rate and rhythm.  She has no murmurs, rubs or bruits.  Abdomen is soft.  She has good bowel sounds.  There is no fluid wave.  There is no palpable liver or spleen tip.  Extremity shows no clubbing, cyanosis or edema.  Neurological exam shows no focal neurological deficits.   We will start her leucovorin.  I know that Pharmacy does a great job in helping [place] manage the leucovorin and managing the methotrexate level.  I would expect that she should have her methotrexate level low enough so that she could go home on the weekend.  Typically, she goes home on Saturday.  Again, we will see about giving her some Toradol for her joints.  She has been bothered by her joint pain.  We will also give her the scheduled Zofran.  I do appreciate the great care she is given from everybody on 6 E.   Christin Bach, MD  John 13:34

## 2023-04-11 NOTE — Plan of Care (Signed)

## 2023-04-12 ENCOUNTER — Encounter (HOSPITAL_COMMUNITY): Payer: Self-pay | Admitting: Hematology & Oncology

## 2023-04-12 DIAGNOSIS — C8580 Other specified types of non-Hodgkin lymphoma, unspecified site: Secondary | ICD-10-CM | POA: Diagnosis not present

## 2023-04-12 LAB — COMPREHENSIVE METABOLIC PANEL
ALT: 77 U/L — ABNORMAL HIGH (ref 0–44)
AST: 54 U/L — ABNORMAL HIGH (ref 15–41)
Albumin: 3.3 g/dL — ABNORMAL LOW (ref 3.5–5.0)
Alkaline Phosphatase: 45 U/L (ref 38–126)
Anion gap: 6 (ref 5–15)
BUN: 10 mg/dL (ref 8–23)
CO2: 38 mmol/L — ABNORMAL HIGH (ref 22–32)
Calcium: 9.3 mg/dL (ref 8.9–10.3)
Chloride: 96 mmol/L — ABNORMAL LOW (ref 98–111)
Creatinine, Ser: 0.66 mg/dL (ref 0.44–1.00)
GFR, Estimated: >60 mL/min (ref >60)
Glucose, Bld: 142 mg/dL — ABNORMAL HIGH (ref 70–99)
Potassium: 3 mmol/L — ABNORMAL LOW (ref 3.5–5.1)
Sodium: 140 mmol/L (ref 135–145)
Total Bilirubin: 0.6 mg/dL (ref 0.0–1.2)
Total Protein: 5.5 g/dL — ABNORMAL LOW (ref 6.5–8.1)

## 2023-04-12 LAB — URINALYSIS, DIPSTICK ONLY
Bilirubin Urine: NEGATIVE
Glucose, UA: NEGATIVE mg/dL
Hgb urine dipstick: NEGATIVE
Ketones, ur: NEGATIVE mg/dL
Leukocytes,Ua: NEGATIVE
Nitrite: NEGATIVE
Protein, ur: NEGATIVE mg/dL
Specific Gravity, Urine: 1.008 (ref 1.005–1.030)
pH: 9 — ABNORMAL HIGH (ref 5.0–8.0)

## 2023-04-12 LAB — CBC WITH DIFFERENTIAL/PLATELET
Abs Immature Granulocytes: 0.01 10*3/uL (ref 0.00–0.07)
Basophils Absolute: 0 10*3/uL (ref 0.0–0.1)
Basophils Relative: 0 %
Eosinophils Absolute: 0.1 10*3/uL (ref 0.0–0.5)
Eosinophils Relative: 2 %
HCT: 34.5 % — ABNORMAL LOW (ref 36.0–46.0)
Hemoglobin: 11.2 g/dL — ABNORMAL LOW (ref 12.0–15.0)
Immature Granulocytes: 0 %
Lymphocytes Relative: 22 %
Lymphs Abs: 0.8 10*3/uL (ref 0.7–4.0)
MCH: 32.5 pg (ref 26.0–34.0)
MCHC: 32.5 g/dL (ref 30.0–36.0)
MCV: 100 fL (ref 80.0–100.0)
Monocytes Absolute: 0.4 10*3/uL (ref 0.1–1.0)
Monocytes Relative: 12 %
Neutro Abs: 2.3 10*3/uL (ref 1.7–7.7)
Neutrophils Relative %: 64 %
Platelets: 181 10*3/uL (ref 150–400)
RBC: 3.45 MIL/uL — ABNORMAL LOW (ref 3.87–5.11)
RDW: 11.9 % (ref 11.5–15.5)
WBC: 3.5 10*3/uL — ABNORMAL LOW (ref 4.0–10.5)
nRBC: 0 % (ref 0.0–0.2)

## 2023-04-12 LAB — METHOTREXATE: Methotrexate: 0.25

## 2023-04-12 MED ORDER — POTASSIUM CHLORIDE 10 MEQ/50ML IV SOLN
10.0000 meq | INTRAVENOUS | Status: AC
  Administered 2023-04-12 (×4): 10 meq via INTRAVENOUS
  Filled 2023-04-12: qty 50

## 2023-04-12 MED ORDER — LEUCOVORIN CALCIUM INJECTION 100 MG
15.0000 mg | Freq: Four times a day (QID) | INTRAMUSCULAR | Status: AC
  Administered 2023-04-12 – 2023-04-13 (×4): 16 mg via INTRAVENOUS
  Filled 2023-04-12 (×9): qty 0.8

## 2023-04-12 NOTE — Consult Note (Signed)
[facility] Liaison Consult Note   04/12/2023  Ariel [name] [birth year] [id number]  Insurance: [employer]   Primary Care Provider: Sandford Craze, NP with Corinda Gubler at [facility], this provider is listed for the transition of care [TOC]  follow up appointments and [facility] calls   [facility] Liaison screened the patient remotely at [facility]. 12:40 pm was able to speak with patient via phone to explain reason for VBCI team for post hospital follow up. Patient was gracious and to expect pot hospital follow up for community support if needed. Verified PCP and insurance.   The patient was screened for less than 30 day readmission hospitalization with noted high risk score for unplanned readmission risk 6 hospital admissions in 6 months.  The patient was assessed for potential Community Care Coordination service needs for post hospital transition for care coordination. Review of patient's electronic medical record reveals patient has been out reach by [facility] team with past admissions.   Plan: [facility] Liaison will continue to follow progress and disposition to asess for post hospital community care coordination/management needs.  Referral request for community care coordination: Anticipate VBCI TOC outreach.  No additional needs assessed from MD progress notes and inpatient [facility] team brief assessment for community needs.  Patient lives at [facility] and remains independent per chart review.  SDOH reviewed and intact. Patient expresses no other needs currently.   VBCI Community Care, Population Health does not replace or interfere with any arrangements made by the Inpatient Transition of Care team.   For questions contact:   Charlesetta Shanks, RN, BSN, CCM [place]  [facility], [facility] Liaison Direct Dial: [phone] or secure chat Email: [email or web address]

## 2023-04-12 NOTE — Progress Notes (Signed)
So far, everything is going according to plan.  She has some nausea and vomiting yesterday.  She is still getting Zofran.  Her methotrexate level yesterday was 5.8.  I am not surprised by this.  Today's level should be much lower.  She is on the leucovorin rescue.  I do appreciate Pharmacy helping me out.  There was a such a great job.  She has had no bleeding.  There has been no diarrhea.  She has had no cough or shortness of breath.  She is out of bed.  There have been no rashes.  Her labs show sodium 140.  Potassium 3.0.  BUN 10 creatinine 0.66.  Her SGPT is 77 SGOT 54.  Again, I am not surprised about this.  Her white cell count 3.5.  Hemoglobin 11.2.  Platelet count 181,000.  Her vital signs show temperature of 98.  Pulse 90.  Blood pressure 128/61.  Her head neck exam shows no ocular or oral lesions.  She has no mucositis.  Lungs are clear bilaterally.  She has good air movement bilaterally.  Cardiac exam regular rate and rhythm.  She has no murmurs.  Abdomen is soft.  She has good bowel sounds.  There is no fluid wave.  There is no palpable liver or spleen tip.  Extremities shows no clubbing, cyanosis or edema.  Neurological exam shows no focal neurological deficits.   Hopefully, we will be able to get her out on Friday.  I do suspect that it probably will be Saturday before we do get her out.  Again, the methotrexate level will be critical.  I will give her some potassium today.  I know that the staff up on 6 E have done a fantastic job with her.  She is so appreciative and thankful and vocal about the great care she is gotten from everybody on 6 E.   Christin Bach, MD  Psalms 116:1

## 2023-04-12 NOTE — Plan of Care (Signed)

## 2023-04-12 NOTE — Plan of Care (Signed)
  Problem: Education: Goal: Knowledge of General Education information will improve Description: Including pain rating scale, medication(s)/side effects and non-pharmacologic comfort measures Outcome: Progressing   Problem: Health Behavior/Discharge Planning: Goal: Ability to manage health-related needs will improve Outcome: Progressing   Problem: Clinical Measurements: Goal: Ability to maintain clinical measurements within normal limits will improve Outcome: Progressing Goal: Will remain free from infection Outcome: Progressing Goal: Diagnostic test results will improve Outcome: Progressing Goal: Respiratory complications will improve Outcome: Progressing Goal: Cardiovascular complication will be avoided Outcome: Progressing   Problem: Activity: Goal: Risk for activity intolerance will decrease Outcome: Progressing   Problem: Nutrition: Goal: Adequate nutrition will be maintained Outcome: Progressing   Problem: Coping: Goal: Level of anxiety will decrease Outcome: Progressing   Problem: Elimination: Goal: Will not experience complications related to bowel motility Outcome: Progressing Goal: Will not experience complications related to urinary retention Outcome: Progressing   Problem: Pain Managment: Goal: General experience of comfort will improve and/or be controlled Outcome: Progressing   Problem: Safety: Goal: Ability to remain free from injury will improve Outcome: Progressing   Problem: Skin Integrity: Goal: Risk for impaired skin integrity will decrease Outcome: Progressing   Problem: Education: Goal: Knowledge of the prescribed therapeutic regimen will improve Outcome: Progressing   Problem: Activity: Goal: Ability to implement measures to reduce episodes of fatigue will improve Outcome: Progressing   Problem: Bowel/Gastric: Goal: Will not experience complications related to bowel motility Outcome: Progressing   Problem: Coping: Goal: Ability to  identify and develop effective coping behavior will improve Outcome: Progressing   Problem: Nutritional: Goal: Maintenance of adequate nutrition will improve Outcome: Progressing

## 2023-04-13 DIAGNOSIS — C8580 Other specified types of non-Hodgkin lymphoma, unspecified site: Secondary | ICD-10-CM | POA: Diagnosis not present

## 2023-04-13 LAB — COMPREHENSIVE METABOLIC PANEL
ALT: 255 U/L — ABNORMAL HIGH (ref 0–44)
AST: 159 U/L — ABNORMAL HIGH (ref 15–41)
Albumin: 3.5 g/dL (ref 3.5–5.0)
Alkaline Phosphatase: 45 U/L (ref 38–126)
Anion gap: 12 (ref 5–15)
BUN: 11 mg/dL (ref 8–23)
CO2: 34 mmol/L — ABNORMAL HIGH (ref 22–32)
Calcium: 9.3 mg/dL (ref 8.9–10.3)
Chloride: 92 mmol/L — ABNORMAL LOW (ref 98–111)
Creatinine, Ser: 0.75 mg/dL (ref 0.44–1.00)
GFR, Estimated: >60 mL/min (ref >60)
Glucose, Bld: 144 mg/dL — ABNORMAL HIGH (ref 70–99)
Potassium: 3.1 mmol/L — ABNORMAL LOW (ref 3.5–5.1)
Sodium: 138 mmol/L (ref 135–145)
Total Bilirubin: 0.7 mg/dL (ref 0.0–1.2)
Total Protein: 5.6 g/dL — ABNORMAL LOW (ref 6.5–8.1)

## 2023-04-13 LAB — CBC WITH DIFFERENTIAL/PLATELET
Abs Immature Granulocytes: 0.03 10*3/uL (ref 0.00–0.07)
Basophils Absolute: 0 10*3/uL (ref 0.0–0.1)
Basophils Relative: 0 %
Eosinophils Absolute: 0.1 10*3/uL (ref 0.0–0.5)
Eosinophils Relative: 2 %
HCT: 34.4 % — ABNORMAL LOW (ref 36.0–46.0)
Hemoglobin: 11.5 g/dL — ABNORMAL LOW (ref 12.0–15.0)
Immature Granulocytes: 1 %
Lymphocytes Relative: 23 %
Lymphs Abs: 1.2 10*3/uL (ref 0.7–4.0)
MCH: 32.9 pg (ref 26.0–34.0)
MCHC: 33.4 g/dL (ref 30.0–36.0)
MCV: 98.3 fL (ref 80.0–100.0)
Monocytes Absolute: 0.2 10*3/uL (ref 0.1–1.0)
Monocytes Relative: 4 %
Neutro Abs: 3.5 10*3/uL (ref 1.7–7.7)
Neutrophils Relative %: 70 %
Platelets: 183 10*3/uL (ref 150–400)
RBC: 3.5 MIL/uL — ABNORMAL LOW (ref 3.87–5.11)
RDW: 11.9 % (ref 11.5–15.5)
WBC: 5 10*3/uL (ref 4.0–10.5)
nRBC: 0 % (ref 0.0–0.2)

## 2023-04-13 LAB — URINALYSIS, DIPSTICK ONLY
Bilirubin Urine: NEGATIVE
Glucose, UA: NEGATIVE mg/dL
Hgb urine dipstick: NEGATIVE
Ketones, ur: NEGATIVE mg/dL
Leukocytes,Ua: NEGATIVE
Nitrite: NEGATIVE
Protein, ur: NEGATIVE mg/dL
Specific Gravity, Urine: 1.009 (ref 1.005–1.030)
pH: 9 — ABNORMAL HIGH (ref 5.0–8.0)

## 2023-04-13 LAB — METHOTREXATE: Methotrexate: 0.06

## 2023-04-13 LAB — MAGNESIUM: Magnesium: 2 mg/dL (ref 1.7–2.4)

## 2023-04-13 MED ORDER — CIPROFLOXACIN HCL 500 MG PO TABS: 250.0000 mg | ORAL_TABLET | Freq: Every day | ORAL | Status: DC

## 2023-04-13 MED ORDER — POTASSIUM CHLORIDE 10 MEQ/50ML IV SOLN
10.0000 meq | INTRAVENOUS | Status: AC
  Administered 2023-04-13 (×4): 10 meq via INTRAVENOUS
  Filled 2023-04-13 (×3): qty 50

## 2023-04-13 MED ORDER — KETOROLAC TROMETHAMINE 30 MG/ML IJ SOLN
30.0000 mg | Freq: Once | INTRAMUSCULAR | Status: AC
  Administered 2023-04-13: 30 mg via INTRAVENOUS
  Filled 2023-04-13: qty 1

## 2023-04-13 MED ORDER — FLUCONAZOLE 100 MG PO TABS: 100.0000 mg | ORAL_TABLET | Freq: Every day | ORAL | Status: DC

## 2023-04-13 MED ORDER — HEPARIN SOD (PORK) LOCK FLUSH 100 UNIT/ML IV SOLN
500.0000 [IU] | INTRAVENOUS | Status: AC | PRN
  Administered 2023-04-13: 500 [IU]
  Filled 2023-04-13: qty 5

## 2023-04-13 MED ORDER — CIPROFLOXACIN HCL 250 MG PO TABS: 250.0000 mg | ORAL_TABLET | Freq: Every day | ORAL | 0 refills | Status: DC

## 2023-04-13 NOTE — Plan of Care (Signed)
  Problem: Education: Goal: Knowledge of General Education information will improve Description: Including pain rating scale, medication(s)/side effects and non-pharmacologic comfort measures Outcome: Progressing   Problem: Health Behavior/Discharge Planning: Goal: Ability to manage health-related needs will improve Outcome: Progressing   Problem: Clinical Measurements: Goal: Ability to maintain clinical measurements within normal limits will improve Outcome: Progressing Goal: Will remain free from infection Outcome: Progressing Goal: Diagnostic test results will improve Outcome: Progressing Goal: Respiratory complications will improve Outcome: Progressing Goal: Cardiovascular complication will be avoided Outcome: Progressing   Problem: Activity: Goal: Risk for activity intolerance will decrease Outcome: Progressing   Problem: Nutrition: Goal: Adequate nutrition will be maintained Outcome: Progressing   Problem: Coping: Goal: Level of anxiety will decrease Outcome: Progressing   Problem: Elimination: Goal: Will not experience complications related to bowel motility Outcome: Progressing Goal: Will not experience complications related to urinary retention Outcome: Progressing   Problem: Pain Managment: Goal: General experience of comfort will improve and/or be controlled Outcome: Progressing   Problem: Safety: Goal: Ability to remain free from injury will improve Outcome: Progressing   Problem: Skin Integrity: Goal: Risk for impaired skin integrity will decrease Outcome: Progressing   Problem: Education: Goal: Knowledge of the prescribed therapeutic regimen will improve Outcome: Progressing   Problem: Activity: Goal: Ability to implement measures to reduce episodes of fatigue will improve Outcome: Progressing   Problem: Bowel/Gastric: Goal: Will not experience complications related to bowel motility Outcome: Progressing   Problem: Coping: Goal: Ability to  identify and develop effective coping behavior will improve Outcome: Progressing   Problem: Nutritional: Goal: Maintenance of adequate nutrition will improve Outcome: Progressing

## 2023-04-13 NOTE — Progress Notes (Signed)
She had a little bit of vomiting yesterday.  She feels okay this morning.  Her potassium is only 3.1.  I will give her some more potassium.  Her urine pH is still greater than 9.  This is wonderful.  Her last methotrexate level was 2.5.  We will see what today's level is.  She really wants to go home.  I would not let her go home until the methotrexate level is less than 0.5.  She is eating okay even though she had the vomiting yesterday.  She has had no bleeding.  There is been no diarrhea.  Her LFTs are elevated which is expected after the methotrexate.  Nausea probably given her a dose of Toradol.  I think the methotrexate is out of her system for the most part so 1 dose of Toradol might help with her discomfort with the hips and knees.  She has had no fever.  There has been no bleeding.  Her white count is 5.  Hemoglobin 9.5.  Platelet count 183,000.  Her sodium 138.  Potassium 3.1.  BUN 11 creatinine 0.75.  Calcium 9.3.  Magnesium is good at 2.0.  SGPT 255 SGOT 159.   Her vital signs show temperature of 97.7.  Pulse 81.  Blood pressure 136/63.  Her lungs sound clear bilaterally.  Cardiac exam regular rate and rhythm.  She has no murmurs.  Oral exam does not show any mucositis.  Abdominal exam is soft.  Bowel sounds are present.  There is no guarding or rebound tenderness.  Extremity shows no clubbing, cyanosis or edema.  She has a little bit of tenderness to palpation over her hips and knees.  Neurological exam is nonfocal.   We will see what the methotrexate level is.  Again, she really wants to go home today.  I am not sure that the methotrexate level will be low enough for her to go home.  If it is, then we will certainly discharge her.  I know that she is incredibly appreciative of the wonderful care she is gotten from everybody upon [address].  I know that the staff have done a tremendous job.  Christin Bach, MD  1 Cor 13:4

## 2023-04-13 NOTE — Discharge Summary (Signed)
Physician Discharge Summary  Patient ID: Ariel [name] @ATTENDINGNPI @ MRN: [id number] DOB/AGE: Feb 25, 1956 68 y.o.  Admit date: 04/09/2023 Discharge date: 04/13/2023  Admission Diagnoses: CNS lymphoma-relapse admitted for chemotherapy  Discharge Diagnoses:  Principal Problem:   Lymphoma malignant, large cell (HCC)   Discharged Condition: good  Discharge Labs:   Significant Diagnostic Studies: None  Consults: None  Disposition: Discharge disposition: 01-Home or Self Care       Treatments: She received chemotherapy with Rituxan and methotrexate.  She also received IV potassium.  Allergies as of 04/13/2023       Reactions   Decadron [dexamethasone] Other (See Comments)   Leg weakness Hallucinations   Plavix [clopidogrel] Hives, Swelling, Other (See Comments)   Lips became swollen   Prednisone Other (See Comments)   Steroids caused psychological issues  Hallucinations   Sulfa Antibiotics Itching, Rash   Codeine Nausea And Vomiting        Medication List     STOP taking these medications    amoxicillin 500 MG capsule Commonly known as: AMOXIL       TAKE these medications    acetaminophen 500 MG tablet Commonly known as: TYLENOL Take 1,000 mg by mouth every 6 (six) hours as needed for headache, fever, moderate pain (pain score 4-6) or mild pain (pain score 1-3).   antiseptic oral rinse Liqd 15 mLs by Mouth Rinse route every 6 (six) hours. What changed:  when to take this reasons to take this   ciprofloxacin 250 MG tablet Commonly known as: CIPRO Take 1 tablet (250 mg total) by mouth daily with breakfast. Start taking on: April 14, 2023   famciclovir 500 MG tablet Commonly known as: FAMVIR Take 500 mg by mouth daily.   fluconazole 100 MG tablet Commonly known as: DIFLUCAN Take 100 mg by mouth daily.   ondansetron 8 MG disintegrating tablet Commonly known as: ZOFRAN-ODT Take 1 tablet (8 mg total) by mouth every 8 (eight) hours as needed  for nausea or vomiting.   OVER THE COUNTER MEDICATION Take 2 capsules by mouth daily. Balance of Nature veggies   OVER THE COUNTER MEDICATION Take 2 capsules by mouth daily. Balance of [employer] Fruits         Follow-up Information     [name], MD Follow up in 3 day(s).   Specialty: Oncology Why: Come to office for lab work on Monday. Contact information: [address] [place] [address] [phone]                 Hospital Course: She was admitted on 04/09/2023.  She had a Port-A-Cath accessed.  She was started on IV fluids with bicarb to keep her urine pH over 9.  We went and started her on Rituxan on the first day.  She tolerated this well.  The second day, she went ahead and got her high-dose methotrexate.  As always, she tolerated this very well.  We restarted the leucovorin rescue the day after.  As always, Pharmacy does a fantastic job in managing this for [place].  We get methotrexate levels daily.  On the day of discharge, her methotrexate level was 0.06.  She did have some nausea and vomiting.  We had her on Zofran.  She did get some Ativan.  Her labs looked okay.  She did have some hypokalemia.  We did give her some potassium.  This helped.  On the day of discharge, her potassium was 3.1.  We did give her some  extra potassium.  She really wanted to go home on Valentine's Day.  I do not think that there was a problem with this.  All of her vital signs were stable.  Her physical exam look good.  She had no oral lesions.  She had no mucositis.  There are no neurological deficits.  She had clear lungs.  Cardiac exam was regular rate and rhythm.  Abdomen is soft.  Bowel sounds are present.  She has no fluid wave.  There is no guarding.  There is no palpable liver or spleen tip.  Extremities shows no clubbing, cyanosis or edema.  She had little bit of swelling in her joints in the knees.  Neurological exam showed no focal neurological  deficits.   Discharge Exam: Blood pressure 136/63, pulse 81, temperature 97.7 F (36.5 C), temperature source Oral, resp. rate 18, height 5\' 6"  (1.676 m), weight 160 lb (72.6 kg), SpO2 96%.   See above for the physical exam.  Signed: [name] 04/13/2023, 1:18 PM

## 2023-04-16 ENCOUNTER — Inpatient Hospital Stay: Payer: Medicare Other | Attending: Internal Medicine

## 2023-04-16 ENCOUNTER — Encounter: Payer: Self-pay | Admitting: Hematology & Oncology

## 2023-04-16 ENCOUNTER — Inpatient Hospital Stay: Payer: Medicare Other | Admitting: Hematology & Oncology

## 2023-04-16 ENCOUNTER — Inpatient Hospital Stay: Payer: Medicare Other

## 2023-04-16 DIAGNOSIS — D509 Iron deficiency anemia, unspecified: Secondary | ICD-10-CM | POA: Insufficient documentation

## 2023-04-16 DIAGNOSIS — C8510 Unspecified B-cell lymphoma, unspecified site: Secondary | ICD-10-CM

## 2023-04-16 LAB — CBC WITH DIFFERENTIAL (CANCER CENTER ONLY)
Abs Immature Granulocytes: 0.04 10*3/uL (ref 0.00–0.07)
Basophils Absolute: 0 10*3/uL (ref 0.0–0.1)
Basophils Relative: 1 %
Eosinophils Absolute: 0.1 10*3/uL (ref 0.0–0.5)
Eosinophils Relative: 3 %
HCT: 38.3 % (ref 36.0–46.0)
Hemoglobin: 13.1 g/dL (ref 12.0–15.0)
Immature Granulocytes: 1 %
Lymphocytes Relative: 30 %
Lymphs Abs: 1.4 10*3/uL (ref 0.7–4.0)
MCH: 32.8 pg (ref 26.0–34.0)
MCHC: 34.2 g/dL (ref 30.0–36.0)
MCV: 96 fL (ref 80.0–100.0)
Monocytes Absolute: 0.4 10*3/uL (ref 0.1–1.0)
Monocytes Relative: 9 %
Neutro Abs: 2.7 10*3/uL (ref 1.7–7.7)
Neutrophils Relative %: 56 %
Platelet Count: 180 10*3/uL (ref 150–400)
RBC: 3.99 MIL/uL (ref 3.87–5.11)
RDW: 11.9 % (ref 11.5–15.5)
WBC Count: 4.7 10*3/uL (ref 4.0–10.5)
nRBC: 0 % (ref 0.0–0.2)

## 2023-04-16 LAB — FERRITIN: Ferritin: 954 ng/mL — ABNORMAL HIGH (ref 11–307)

## 2023-04-16 LAB — CMP (CANCER CENTER ONLY)
ALT: 128 U/L — ABNORMAL HIGH (ref 0–44)
AST: 30 U/L (ref 15–41)
Albumin: 4.9 g/dL (ref 3.5–5.0)
Alkaline Phosphatase: 67 U/L (ref 38–126)
Anion gap: 9 (ref 5–15)
BUN: 20 mg/dL (ref 8–23)
CO2: 28 mmol/L (ref 22–32)
Calcium: 10.6 mg/dL — ABNORMAL HIGH (ref 8.9–10.3)
Chloride: 102 mmol/L (ref 98–111)
Creatinine: 0.72 mg/dL (ref 0.44–1.00)
GFR, Estimated: >60 mL/min (ref >60)
Glucose, Bld: 101 mg/dL — ABNORMAL HIGH (ref 70–99)
Potassium: 4.5 mmol/L (ref 3.5–5.1)
Sodium: 139 mmol/L (ref 135–145)
Total Bilirubin: 0.4 mg/dL (ref 0.0–1.2)
Total Protein: 7.1 g/dL (ref 6.5–8.1)

## 2023-04-16 LAB — LACTATE DEHYDROGENASE: LDH: 220 U/L — ABNORMAL HIGH (ref 98–192)

## 2023-04-16 LAB — IRON AND IRON BINDING CAPACITY (CC-WL,HP ONLY)
Iron: 57 ug/dL (ref 28–170)
Saturation Ratios: 17 % (ref 10.4–31.8)
TIBC: 339 ug/dL (ref 250–450)
UIBC: 282 ug/dL (ref 148–442)

## 2023-04-17 ENCOUNTER — Other Ambulatory Visit: Payer: Self-pay

## 2023-04-17 ENCOUNTER — Encounter: Payer: Self-pay | Admitting: Hematology & Oncology

## 2023-04-18 ENCOUNTER — Other Ambulatory Visit: Payer: Self-pay

## 2023-04-24 DIAGNOSIS — Z961 Presence of intraocular lens: Secondary | ICD-10-CM | POA: Diagnosis not present

## 2023-04-24 DIAGNOSIS — H52203 Unspecified astigmatism, bilateral: Secondary | ICD-10-CM | POA: Diagnosis not present

## 2023-04-24 DIAGNOSIS — C8583 Other specified types of non-Hodgkin lymphoma, intra-abdominal lymph nodes: Secondary | ICD-10-CM | POA: Diagnosis not present

## 2023-04-24 DIAGNOSIS — H35371 Puckering of macula, right eye: Secondary | ICD-10-CM | POA: Diagnosis not present

## 2023-04-27 DIAGNOSIS — H35373 Puckering of macula, bilateral: Secondary | ICD-10-CM | POA: Diagnosis not present

## 2023-04-27 DIAGNOSIS — H35363 Drusen (degenerative) of macula, bilateral: Secondary | ICD-10-CM | POA: Diagnosis not present

## 2023-04-27 DIAGNOSIS — H353121 Nonexudative age-related macular degeneration, left eye, early dry stage: Secondary | ICD-10-CM | POA: Diagnosis not present

## 2023-04-27 DIAGNOSIS — H30111 Disseminated chorioretinal inflammation of posterior pole, right eye: Secondary | ICD-10-CM | POA: Diagnosis not present

## 2023-04-27 DIAGNOSIS — H353112 Nonexudative age-related macular degeneration, right eye, intermediate dry stage: Secondary | ICD-10-CM | POA: Diagnosis not present

## 2023-05-03 ENCOUNTER — Other Ambulatory Visit: Payer: Self-pay

## 2023-05-04 DIAGNOSIS — M170 Bilateral primary osteoarthritis of knee: Secondary | ICD-10-CM | POA: Diagnosis not present

## 2023-05-04 DIAGNOSIS — M1712 Unilateral primary osteoarthritis, left knee: Secondary | ICD-10-CM | POA: Diagnosis not present

## 2023-05-04 DIAGNOSIS — M1711 Unilateral primary osteoarthritis, right knee: Secondary | ICD-10-CM | POA: Diagnosis not present

## 2023-05-06 ENCOUNTER — Other Ambulatory Visit: Payer: Self-pay

## 2023-05-07 ENCOUNTER — Other Ambulatory Visit: Payer: Self-pay | Admitting: *Deleted

## 2023-05-07 DIAGNOSIS — C8510 Unspecified B-cell lymphoma, unspecified site: Secondary | ICD-10-CM

## 2023-05-08 ENCOUNTER — Inpatient Hospital Stay: Payer: Medicare Other | Attending: Internal Medicine | Admitting: Hematology & Oncology

## 2023-05-08 ENCOUNTER — Inpatient Hospital Stay: Payer: Medicare Other | Attending: Internal Medicine

## 2023-05-08 ENCOUNTER — Inpatient Hospital Stay: Payer: Medicare Other

## 2023-05-08 VITALS — BP 139/72 | HR 80 | Temp 98.7°F | Resp 17 | Wt 160.0 lb

## 2023-05-08 DIAGNOSIS — C8510 Unspecified B-cell lymphoma, unspecified site: Secondary | ICD-10-CM

## 2023-05-08 DIAGNOSIS — D509 Iron deficiency anemia, unspecified: Secondary | ICD-10-CM | POA: Insufficient documentation

## 2023-05-08 DIAGNOSIS — Z79899 Other long term (current) drug therapy: Secondary | ICD-10-CM | POA: Insufficient documentation

## 2023-05-08 DIAGNOSIS — C8330 Diffuse large B-cell lymphoma, unspecified site: Secondary | ICD-10-CM | POA: Insufficient documentation

## 2023-05-08 DIAGNOSIS — Z9221 Personal history of antineoplastic chemotherapy: Secondary | ICD-10-CM | POA: Insufficient documentation

## 2023-05-08 DIAGNOSIS — Z95828 Presence of other vascular implants and grafts: Secondary | ICD-10-CM

## 2023-05-08 LAB — CBC WITH DIFFERENTIAL (CANCER CENTER ONLY)
Abs Immature Granulocytes: 0.01 10*3/uL (ref 0.00–0.07)
Basophils Absolute: 0 10*3/uL (ref 0.0–0.1)
Basophils Relative: 0 %
Eosinophils Absolute: 0.1 10*3/uL (ref 0.0–0.5)
Eosinophils Relative: 2 %
HCT: 39.2 % (ref 36.0–46.0)
Hemoglobin: 13.4 g/dL (ref 12.0–15.0)
Immature Granulocytes: 0 %
Lymphocytes Relative: 42 %
Lymphs Abs: 1.7 10*3/uL (ref 0.7–4.0)
MCH: 32.5 pg (ref 26.0–34.0)
MCHC: 34.2 g/dL (ref 30.0–36.0)
MCV: 95.1 fL (ref 80.0–100.0)
Monocytes Absolute: 0.8 10*3/uL (ref 0.1–1.0)
Monocytes Relative: 20 %
Neutro Abs: 1.5 10*3/uL — ABNORMAL LOW (ref 1.7–7.7)
Neutrophils Relative %: 36 %
Platelet Count: 243 10*3/uL (ref 150–400)
RBC: 4.12 MIL/uL (ref 3.87–5.11)
RDW: 12 % (ref 11.5–15.5)
WBC Count: 4.1 10*3/uL (ref 4.0–10.5)
nRBC: 0 % (ref 0.0–0.2)

## 2023-05-08 LAB — CMP (CANCER CENTER ONLY)
ALT: 21 U/L (ref 0–44)
AST: 16 U/L (ref 15–41)
Albumin: 4.9 g/dL (ref 3.5–5.0)
Alkaline Phosphatase: 61 U/L (ref 38–126)
Anion gap: 10 (ref 5–15)
BUN: 17 mg/dL (ref 8–23)
CO2: 28 mmol/L (ref 22–32)
Calcium: 9.8 mg/dL (ref 8.9–10.3)
Chloride: 99 mmol/L (ref 98–111)
Creatinine: 0.66 mg/dL (ref 0.44–1.00)
GFR, Estimated: >60 mL/min (ref >60)
Glucose, Bld: 107 mg/dL — ABNORMAL HIGH (ref 70–99)
Potassium: 3.9 mmol/L (ref 3.5–5.1)
Sodium: 137 mmol/L (ref 135–145)
Total Bilirubin: 0.4 mg/dL (ref 0.0–1.2)
Total Protein: 6.9 g/dL (ref 6.5–8.1)

## 2023-05-08 LAB — LACTATE DEHYDROGENASE: LDH: 161 U/L (ref 98–192)

## 2023-05-08 MED ORDER — SODIUM CHLORIDE 0.9% FLUSH
10.0000 mL | INTRAVENOUS | Status: DC | PRN
  Administered 2023-05-08: 10 mL via INTRAVENOUS

## 2023-05-08 MED ORDER — HEPARIN SOD (PORK) LOCK FLUSH 100 UNIT/ML IV SOLN
500.0000 [IU] | Freq: Once | INTRAVENOUS | Status: AC
  Administered 2023-05-08: 500 [IU] via INTRAVENOUS

## 2023-05-08 NOTE — Progress Notes (Signed)
 Hematology and Oncology Follow Up Visit  Ariel [name] [id number] 1955/09/28 68 y.o. 05/08/2023   Principle Diagnosis:  Diffuse large cell non-Hodgkin's lymphoma-CNS involvement -- relapsed   Past Therapy: Status post chemotherapy with R-ICE/R-MTV --  s/p cycle #4 MATRIX -- s/p cycle #4 -- start on 05/02/2021 --completed on 08/06/2021 Neulasta 6 mg subcu post chemotherapy MTV- s/p cycle #1 -- given on 01/22/204   Current Therapy:        Imbruvica/Rituxan -- started 05/02/2022, s/p cycle #5 -DC on 07/16/2022 --DC on 10/29/2022 High-dose methotrexate/Rituxan-cycle #5  -- started on 11/03/2022   Interim History:  Ariel [name] is here today for follow-up.  She continues to do quite well.  She tolerated her fifth cycle of chemotherapy well.  Again, she was had no problems with the high-dose methotrexate with Rituxan.  She still is not decided about the CAR-T therapy.  She is still going to think about this.  She had MRI that was done on 04/04/2023.  The MRI of the brain did not show any evidence of recurrent lymphoma.  She has had no headache.  She has had no visual changes.  She is back to driving..  She still is at the assisted living center.  She really enjoys it there.  They do a great job with her.  She enjoys the Insurance underwriter..  She is going down to [place] in a couple weeks to see her 2 grandsons.  I am sure that she will have a wonderful time.  She has had no fever.  There has been no bleeding.  She has had no mouth sores.  There has been no cough.  Overall, I would have to say that her performance status is ECOG 0.   Medications:  Allergies as of 05/08/2023       Reactions   Decadron [dexamethasone] Other (See Comments)   Leg weakness Hallucinations   Plavix [clopidogrel] Hives, Swelling, Other (See Comments)   Lips became swollen   Prednisone Other (See Comments)   Steroids caused psychological issues  Hallucinations   Sulfa Antibiotics Itching, Rash   Codeine Nausea And Vomiting         Medication List        Accurate as of May 08, 2023 12:21 PM. If you have any questions, ask your nurse or doctor.          acetaminophen 500 MG tablet Commonly known as: TYLENOL Take 1,000 mg by mouth every 6 (six) hours as needed for headache, fever, moderate pain (pain score 4-6) or mild pain (pain score 1-3).   antiseptic oral rinse Liqd 15 mLs by Mouth Rinse route every 6 (six) hours. What changed:  when to take this reasons to take this   ciprofloxacin 250 MG tablet Commonly known as: CIPRO Take 1 tablet (250 mg total) by mouth daily with breakfast.   famciclovir 500 MG tablet Commonly known as: FAMVIR Take 500 mg by mouth daily.   fluconazole 100 MG tablet Commonly known as: DIFLUCAN Take 100 mg by mouth daily.   ondansetron 8 MG disintegrating tablet Commonly known as: ZOFRAN-ODT Take 1 tablet (8 mg total) by mouth every 8 (eight) hours as needed for nausea or vomiting.   OVER THE COUNTER MEDICATION Take 2 capsules by mouth daily. Balance of Nature veggies   OVER THE COUNTER MEDICATION Take 2 capsules by mouth daily. Balance of Nature Fruits        Allergies:  Allergies  Allergen Reactions   Decadron [Dexamethasone] Other (See Comments)  Leg weakness Hallucinations   Plavix [Clopidogrel] Hives, Swelling and Other (See Comments)    Lips became swollen   Prednisone Other (See Comments)    Steroids caused psychological issues  Hallucinations   Sulfa Antibiotics Itching and Rash   Codeine Nausea And Vomiting    Past Medical History, Surgical history, Social history, and Family History were reviewed and updated.  Review of Systems: Review of Systems  Constitutional: Negative.   HENT: Negative.    Eyes: Negative.   Respiratory: Negative.    Cardiovascular: Negative.   Gastrointestinal: Negative.   Genitourinary: Negative.   Musculoskeletal: Negative.   Skin: Negative.   Neurological: Negative.   Endo/Heme/Allergies: Negative.    Psychiatric/Behavioral: Negative.       Physical Exam:  Vital signs are temperature 98.7.  Pulse 80.  Blood pressure 139/72.  Weight is 160 pounds.     Wt Readings from Last 3 Encounters:  04/09/23 160 lb (72.6 kg)  03/30/23 166 lb (75.3 kg)  03/05/23 153 lb 7 oz (69.6 kg)    Physical Activity: Sufficiently Active (09/05/2022)   Exercise Vital Sign    Days of Exercise per Week: 6 days    Minutes of Exercise per Session: 40 min   Physical Exam Vitals reviewed.  HENT:     Head: Normocephalic and atraumatic.  Eyes:     Pupils: Pupils are equal, round, and reactive to light.  Cardiovascular:     Rate and Rhythm: Normal rate and regular rhythm.     Heart sounds: Normal heart sounds.  Pulmonary:     Effort: Pulmonary effort is normal.     Breath sounds: Normal breath sounds.  Abdominal:     General: Bowel sounds are normal.     Palpations: Abdomen is soft.  Musculoskeletal:        General: No tenderness or deformity. Normal range of motion.     Cervical back: Normal range of motion.  Lymphadenopathy:     Cervical: No cervical adenopathy.  Skin:    General: Skin is warm and dry.     Findings: No erythema or rash.  Neurological:     Mental Status: She is alert and oriented to person, place, and time.  Psychiatric:        Behavior: Behavior normal.        Thought Content: Thought content normal.        Judgment: Judgment normal.      Lab Results  Component Value Date   WBC 4.7 04/16/2023   HGB 13.1 04/16/2023   HCT 38.3 04/16/2023   MCV 96.0 04/16/2023   PLT 180 04/16/2023   Lab Results  Component Value Date   FERRITIN 954 (H) 04/16/2023   IRON 57 04/16/2023   TIBC 339 04/16/2023   UIBC 282 04/16/2023   IRONPCTSAT 17 04/16/2023   Lab Results  Component Value Date   RETICCTPCT 2.4 11/15/2021   RBC 3.99 04/16/2023   No results found for: "KPAFRELGTCHN", "LAMBDASER", "KAPLAMBRATIO" No results found for: "IGGSERUM", "IGA", "IGMSERUM" No results found  for: "TOTALPROTELP", "ALBUMINELP", "A1GS", "A2GS", "BETS", "BETA2SER", "GAMS", "MSPIKE", "SPEI"   Chemistry      Component Value Date/Time   NA 139 04/16/2023 1006   K 4.5 04/16/2023 1006   CL 102 04/16/2023 1006   CO2 28 04/16/2023 1006   BUN 20 04/16/2023 1006   CREATININE 0.72 04/16/2023 1006   CREATININE 0.68 11/18/2012 1439      Component Value Date/Time   CALCIUM 10.6 (H) 04/16/2023 1006  ALKPHOS 67 04/16/2023 1006   AST 30 04/16/2023 1006   ALT 128 (H) 04/16/2023 1006   BILITOT 0.4 04/16/2023 1006       Impression and Plan: Ms. Breach is a very pleasant 68 yo caucasian female with iron deficiency anemia and then was diagnosed with diffuse large cell non-Hodgkin's lymphoma that had traveled to her brain.  She  has had another relapse.  She had been on Rituxan/IMBRUVICA.  We subsequently had to discontinue this.    We now have her on Rituxan/high-dose of methotrexate.  She has had 5 cycles.   For right now, we will just follow her along.  Again, I would think that she would be a candidate for CAR-T therapy.  She will come back after Easter now.  I know that she will have a wonderful Easter holiday.  Marland Kitchen    [name], MD 3/11/202512:21 PM

## 2023-05-08 NOTE — Patient Instructions (Signed)

## 2023-05-10 ENCOUNTER — Other Ambulatory Visit: Payer: Self-pay

## 2023-05-26 ENCOUNTER — Other Ambulatory Visit: Payer: Self-pay

## 2023-05-27 ENCOUNTER — Other Ambulatory Visit: Payer: Self-pay

## 2023-05-31 ENCOUNTER — Other Ambulatory Visit: Payer: Self-pay

## 2023-05-31 ENCOUNTER — Inpatient Hospital Stay: Attending: Internal Medicine

## 2023-05-31 ENCOUNTER — Inpatient Hospital Stay

## 2023-05-31 ENCOUNTER — Telehealth: Payer: Self-pay

## 2023-05-31 DIAGNOSIS — C8510 Unspecified B-cell lymphoma, unspecified site: Secondary | ICD-10-CM

## 2023-05-31 DIAGNOSIS — Z9221 Personal history of antineoplastic chemotherapy: Secondary | ICD-10-CM | POA: Diagnosis not present

## 2023-05-31 DIAGNOSIS — Z79899 Other long term (current) drug therapy: Secondary | ICD-10-CM | POA: Insufficient documentation

## 2023-05-31 DIAGNOSIS — Z5111 Encounter for antineoplastic chemotherapy: Secondary | ICD-10-CM | POA: Insufficient documentation

## 2023-05-31 DIAGNOSIS — Z9484 Stem cells transplant status: Secondary | ICD-10-CM | POA: Insufficient documentation

## 2023-05-31 DIAGNOSIS — D509 Iron deficiency anemia, unspecified: Secondary | ICD-10-CM | POA: Diagnosis not present

## 2023-05-31 DIAGNOSIS — C8330 Diffuse large B-cell lymphoma, unspecified site: Secondary | ICD-10-CM | POA: Insufficient documentation

## 2023-05-31 LAB — CBC WITH DIFFERENTIAL (CANCER CENTER ONLY)
Abs Immature Granulocytes: 0.01 10*3/uL (ref 0.00–0.07)
Basophils Absolute: 0 10*3/uL (ref 0.0–0.1)
Basophils Relative: 0 %
Eosinophils Absolute: 0.1 10*3/uL (ref 0.0–0.5)
Eosinophils Relative: 3 %
HCT: 35.6 % — ABNORMAL LOW (ref 36.0–46.0)
Hemoglobin: 12.1 g/dL (ref 12.0–15.0)
Immature Granulocytes: 0 %
Lymphocytes Relative: 33 %
Lymphs Abs: 1.3 10*3/uL (ref 0.7–4.0)
MCH: 32.2 pg (ref 26.0–34.0)
MCHC: 34 g/dL (ref 30.0–36.0)
MCV: 94.7 fL (ref 80.0–100.0)
Monocytes Absolute: 0.6 10*3/uL (ref 0.1–1.0)
Monocytes Relative: 14 %
Neutro Abs: 1.9 10*3/uL (ref 1.7–7.7)
Neutrophils Relative %: 50 %
Platelet Count: 199 10*3/uL (ref 150–400)
RBC: 3.76 MIL/uL — ABNORMAL LOW (ref 3.87–5.11)
RDW: 11.9 % (ref 11.5–15.5)
WBC Count: 3.9 10*3/uL — ABNORMAL LOW (ref 4.0–10.5)
nRBC: 0 % (ref 0.0–0.2)

## 2023-05-31 LAB — TSH: TSH: 1.477 u[IU]/mL (ref 0.350–4.500)

## 2023-05-31 LAB — IRON AND IRON BINDING CAPACITY (CC-WL,HP ONLY)
Iron: 67 ug/dL (ref 28–170)
Saturation Ratios: 23 % (ref 10.4–31.8)
TIBC: 298 ug/dL (ref 250–450)
UIBC: 231 ug/dL (ref 148–442)

## 2023-05-31 LAB — FERRITIN: Ferritin: 711 ng/mL — ABNORMAL HIGH (ref 11–307)

## 2023-05-31 MED ORDER — SODIUM CHLORIDE 0.9 % IV SOLN: INTRAVENOUS | Status: DC

## 2023-05-31 NOTE — Patient Instructions (Signed)

## 2023-05-31 NOTE — Telephone Encounter (Signed)
 Received phone call from patient that she has been feeling an increase in fatigue over the week and wondering if she can get her iron levels checked. Pt states she leaves for [place] tomorrow and wanted to just double check "things were ok"  Dr. Myna Hidalgo aware and lab orders received. Pt aware to come in for labs and to wait for results to see if hemoglobin has dropped and possibly will receive iron. Pt verbalized understanding and had no further questions. Pt appreciative of call and assistance.

## 2023-05-31 NOTE — Progress Notes (Signed)
 No need for iron infusion based on H&H per Dr Myna Hidalgo. dph

## 2023-06-09 ENCOUNTER — Emergency Department (HOSPITAL_COMMUNITY)

## 2023-06-09 ENCOUNTER — Emergency Department (HOSPITAL_COMMUNITY)
Admission: EM | Admit: 2023-06-09 | Discharge: 2023-06-10 | Disposition: A | Discharge status: Skilled Nursing Facility | Attending: Emergency Medicine | Admitting: Emergency Medicine

## 2023-06-09 ENCOUNTER — Encounter (HOSPITAL_COMMUNITY): Payer: Self-pay

## 2023-06-09 ENCOUNTER — Encounter: Payer: Self-pay | Admitting: Hematology & Oncology

## 2023-06-09 ENCOUNTER — Other Ambulatory Visit: Payer: Self-pay

## 2023-06-09 DIAGNOSIS — H532 Diplopia: Secondary | ICD-10-CM | POA: Insufficient documentation

## 2023-06-09 DIAGNOSIS — H538 Other visual disturbances: Secondary | ICD-10-CM | POA: Diagnosis not present

## 2023-06-09 DIAGNOSIS — H579 Unspecified disorder of eye and adnexa: Secondary | ICD-10-CM | POA: Diagnosis not present

## 2023-06-09 DIAGNOSIS — Z8572 Personal history of non-Hodgkin lymphomas: Secondary | ICD-10-CM | POA: Diagnosis not present

## 2023-06-09 DIAGNOSIS — H539 Unspecified visual disturbance: Secondary | ICD-10-CM | POA: Diagnosis not present

## 2023-06-09 DIAGNOSIS — R9082 White matter disease, unspecified: Secondary | ICD-10-CM | POA: Diagnosis not present

## 2023-06-09 LAB — CBC WITH DIFFERENTIAL/PLATELET
Abs Immature Granulocytes: 0.03 10*3/uL (ref 0.00–0.07)
Basophils Absolute: 0 10*3/uL (ref 0.0–0.1)
Basophils Relative: 0 %
Eosinophils Absolute: 0.1 10*3/uL (ref 0.0–0.5)
Eosinophils Relative: 1 %
HCT: 41 % (ref 36.0–46.0)
Hemoglobin: 13.6 g/dL (ref 12.0–15.0)
Immature Granulocytes: 0 %
Lymphocytes Relative: 24 %
Lymphs Abs: 1.9 10*3/uL (ref 0.7–4.0)
MCH: 31.6 pg (ref 26.0–34.0)
MCHC: 33.2 g/dL (ref 30.0–36.0)
MCV: 95.1 fL (ref 80.0–100.0)
Monocytes Absolute: 0.6 10*3/uL (ref 0.1–1.0)
Monocytes Relative: 8 %
Neutro Abs: 5.2 10*3/uL (ref 1.7–7.7)
Neutrophils Relative %: 67 %
Platelets: 245 10*3/uL (ref 150–400)
RBC: 4.31 MIL/uL (ref 3.87–5.11)
RDW: 11.7 % (ref 11.5–15.5)
WBC: 7.9 10*3/uL (ref 4.0–10.5)
nRBC: 0 % (ref 0.0–0.2)

## 2023-06-09 LAB — COMPREHENSIVE METABOLIC PANEL WITH GFR
ALT: 22 U/L (ref 0–44)
AST: 21 U/L (ref 15–41)
Albumin: 4.4 g/dL (ref 3.5–5.0)
Alkaline Phosphatase: 47 U/L (ref 38–126)
Anion gap: 11 (ref 5–15)
BUN: 13 mg/dL (ref 8–23)
CO2: 26 mmol/L (ref 22–32)
Calcium: 10.1 mg/dL (ref 8.9–10.3)
Chloride: 101 mmol/L (ref 98–111)
Creatinine, Ser: 0.68 mg/dL (ref 0.44–1.00)
GFR, Estimated: >60 mL/min (ref >60)
Glucose, Bld: 119 mg/dL — ABNORMAL HIGH (ref 70–99)
Potassium: 3.7 mmol/L (ref 3.5–5.1)
Sodium: 138 mmol/L (ref 135–145)
Total Bilirubin: 0.6 mg/dL (ref 0.0–1.2)
Total Protein: 6.9 g/dL (ref 6.5–8.1)

## 2023-06-09 MED ORDER — GADOBUTROL 1 MMOL/ML IV SOLN
7.0000 mL | Freq: Once | INTRAVENOUS | Status: AC | PRN
  Administered 2023-06-09: 7 mL via INTRAVENOUS

## 2023-06-09 MED ORDER — GADOBUTROL 1 MMOL/ML IV SOLN: 7.0000 mL | Freq: Once | INTRAVENOUS | Status: DC | PRN

## 2023-06-09 NOTE — ED Provider Notes (Signed)
 [place] EMERGENCY DEPARTMENT AT [facility] Provider Note   CSN: [id number] Arrival date & time: 06/09/23  1845     History  No chief complaint on file.  HPI   Ariel [name] is a 68 y.o. female with past medical history CNS lymphoma currently in remission as of 1 week ago presents due to transient visual change.  She states that she woke up from nap around 5 PM when she noted she had blurred described as double vision.  She states that this was most prominent in her right eye, for which she has previously had cataract surgery and "a blood clot."  She states that her double vision resolved around 7 PM, with no issues currently.  She denies any trauma to the eye, tearing, fevers.  She spoke with her ophthalmologist, who suggested she present here for further workup.  She states she also called her hematologist office, they are aware of her presentation here.      Home Medications Prior to Admission medications   Medication Sig Start Date End Date Taking? Authorizing Provider  acetaminophen  (TYLENOL ) 500 MG tablet Take 1,000 mg by mouth every 6 (six) hours as needed for headache, fever, moderate pain (pain score 4-6) or mild pain (pain score 1-3).   Yes [provider]  ondansetron  (ZOFRAN -ODT) 8 MG disintegrating tablet Take 1 tablet (8 mg total) by mouth every 8 (eight) hours as needed for nausea or vomiting. 10/26/22  Yes Ennever, Sherryll Donald, MD      Allergies    Decadron  Alverta Johns ], Plavix  [clopidogrel ], Prednisone, Sulfa  antibiotics, and Codeine    Review of Systems   Review of Systems  Physical Exam Updated Vital Signs BP 138/60   Pulse 82   Temp 97.9 F (36.6 C) (Oral)   Resp 16   Ht 5\' 7"  (1.702 m)   Wt 72.6 kg   SpO2 100%   BMI 25.06 kg/m  Physical Exam Vitals and nursing note reviewed.  Constitutional:      General: She is not in acute distress.    Appearance: She is well-developed.  HENT:     Head: Normocephalic and atraumatic.  Eyes:      General:        Right eye: No discharge.        Left eye: No discharge.     Extraocular Movements: Extraocular movements intact.     Conjunctiva/sclera: Conjunctivae normal.     Pupils: Pupils are equal, round, and reactive to light.     Comments: 20/30 bilaterally, left is 20/120, right is 20/25.  Cardiovascular:     Rate and Rhythm: Normal rate and regular rhythm.     Heart sounds: No murmur heard. Pulmonary:     Effort: Pulmonary effort is normal. No respiratory distress.     Breath sounds: Normal breath sounds.  Abdominal:     Palpations: Abdomen is soft.     Tenderness: There is no abdominal tenderness.  Musculoskeletal:        General: No swelling.     Cervical back: Neck supple.  Skin:    General: Skin is warm and dry.     Capillary Refill: Capillary refill takes less than 2 seconds.  Neurological:     General: No focal deficit present.     Mental Status: She is alert and oriented to person, place, and time.     Motor: No weakness.     Comments: CN III through XII intact. 5/5 strength in bilateral upper extremities and  bilateral lower extremities.  Intact sensation throughout.  Normal FTN  Psychiatric:        Mood and Affect: Mood normal.     ED Results / Procedures / Treatments   Labs (all labs ordered are listed, but only abnormal results are displayed) Labs Reviewed  COMPREHENSIVE METABOLIC PANEL WITH GFR - Abnormal; Notable for the following components:      Result Value   Glucose, Bld 119 (*)    All other components within normal limits  CBC WITH DIFFERENTIAL/PLATELET    EKG None  Radiology MR BRAIN W WO CONTRAST Result Date: 06/09/2023 CLINICAL DATA:  Initial evaluation for acute double vision. EXAM: MRI HEAD AND ORBITS WITHOUT AND WITH CONTRAST TECHNIQUE: Multiplanar, multiecho pulse sequences of the brain and surrounding structures were obtained without and with intravenous contrast. Multiplanar, multiecho pulse sequences of the orbits and  surrounding structures were obtained including fat saturation techniques, before and after intravenous contrast administration. CONTRAST:  7mL GADAVIST  GADOBUTROL  1 MMOL/ML IV SOLN COMPARISON:  Comparison made with prior MRI from 04/04/2023 as well as previous studies. FINDINGS: MRI HEAD FINDINGS Brain: Cerebral volume within normal limits. Mild chronic microvascular ischemic disease noted involving the supratentorial cerebral white matter. Few areas of encephalomalacia and gliosis with chronic hemosiderin staining involving the left frontal and temporal lobes again noted. No evidence for acute or subacute infarct. No acute intracranial hemorrhage. No other significant chronic intracranial blood products. There is new/increased T2/FLAIR signal abnormality involving the subcortical aspects of the anterior/inferior frontal lobes bilaterally, right greater than left (series 11, image 26). Patchy post-contrast enhancement seen within this region (series 20, images 29, 27). Additional new/increased signal abnormality at the right cerebral peduncle without convincing enhancement (series 11, image 24). Findings are concerning for residual and/or recurrent lymphoma. No other mass lesion, mass effect or midline shift. No hydrocephalus or extra-axial fluid collection. Pituitary gland within normal limits. No other abnormal enhancement. Vascular: Major intracranial vascular flow voids are maintained. Skull and upper cervical spine: Craniocervical junction within normal limits. Bone marrow signal intensity normal. No scalp soft tissue abnormality. Other: No significant mastoid effusion. MRI ORBITS FINDINGS Orbits: Globes are symmetric in size with normal appearance and morphology. Prior bilateral ocular lens replacement. Optic nerves are symmetric and within normal limits. No abnormal optic nerve edema or enhancement to suggest acute optic neuritis. Extra-ocular muscles symmetric and normal. Lacrimal glands normal. Intraconal  and extraconal fat maintained. No abnormality about the orbital apices or cavernous sinus. Optic chiasm normally situated within the suprasellar cistern. Visualized sinuses: Clear. Soft tissues: Unremarkable. IMPRESSION: MRI HEAD: 1. New/increased T2/FLAIR signal abnormality involving the subcortical aspects of the anterior/inferior frontal lobes bilaterally, right greater than left, with patchy post-contrast enhancement. Additional new/increased signal abnormality at the right cerebral peduncle without enhancement. Given patient history, findings are most concerning for recurrent lymphoma. 2. No other acute intracranial abnormality. 3. Mild chronic microvascular ischemic disease with areas of encephalomalacia and gliosis involving the left frontal and temporal lobes, stable. MRI ORBITS: Normal MRI appearance of the orbits. Electronically Signed   By: Virgia Griffins M.D.   On: 06/09/2023 23:00   MR ORBITS W WO CONTRAST Result Date: 06/09/2023 CLINICAL DATA:  Initial evaluation for acute double vision. EXAM: MRI HEAD AND ORBITS WITHOUT AND WITH CONTRAST TECHNIQUE: Multiplanar, multiecho pulse sequences of the brain and surrounding structures were obtained without and with intravenous contrast. Multiplanar, multiecho pulse sequences of the orbits and surrounding structures were obtained including fat saturation techniques, before and after intravenous contrast  administration. CONTRAST:  7mL GADAVIST  GADOBUTROL  1 MMOL/ML IV SOLN COMPARISON:  Comparison made with prior MRI from 04/04/2023 as well as previous studies. FINDINGS: MRI HEAD FINDINGS Brain: Cerebral volume within normal limits. Mild chronic microvascular ischemic disease noted involving the supratentorial cerebral white matter. Few areas of encephalomalacia and gliosis with chronic hemosiderin staining involving the left frontal and temporal lobes again noted. No evidence for acute or subacute infarct. No acute intracranial hemorrhage. No other  significant chronic intracranial blood products. There is new/increased T2/FLAIR signal abnormality involving the subcortical aspects of the anterior/inferior frontal lobes bilaterally, right greater than left (series 11, image 26). Patchy post-contrast enhancement seen within this region (series 20, images 29, 27). Additional new/increased signal abnormality at the right cerebral peduncle without convincing enhancement (series 11, image 24). Findings are concerning for residual and/or recurrent lymphoma. No other mass lesion, mass effect or midline shift. No hydrocephalus or extra-axial fluid collection. Pituitary gland within normal limits. No other abnormal enhancement. Vascular: Major intracranial vascular flow voids are maintained. Skull and upper cervical spine: Craniocervical junction within normal limits. Bone marrow signal intensity normal. No scalp soft tissue abnormality. Other: No significant mastoid effusion. MRI ORBITS FINDINGS Orbits: Globes are symmetric in size with normal appearance and morphology. Prior bilateral ocular lens replacement. Optic nerves are symmetric and within normal limits. No abnormal optic nerve edema or enhancement to suggest acute optic neuritis. Extra-ocular muscles symmetric and normal. Lacrimal glands normal. Intraconal and extraconal fat maintained. No abnormality about the orbital apices or cavernous sinus. Optic chiasm normally situated within the suprasellar cistern. Visualized sinuses: Clear. Soft tissues: Unremarkable. IMPRESSION: MRI HEAD: 1. New/increased T2/FLAIR signal abnormality involving the subcortical aspects of the anterior/inferior frontal lobes bilaterally, right greater than left, with patchy post-contrast enhancement. Additional new/increased signal abnormality at the right cerebral peduncle without enhancement. Given patient history, findings are most concerning for recurrent lymphoma. 2. No other acute intracranial abnormality. 3. Mild chronic  microvascular ischemic disease with areas of encephalomalacia and gliosis involving the left frontal and temporal lobes, stable. MRI ORBITS: Normal MRI appearance of the orbits. Electronically Signed   By: Virgia Griffins M.D.   On: 06/09/2023 23:00    Procedures Procedures    Medications Ordered in ED Medications  gadobutrol  (GADAVIST ) 1 MMOL/ML injection 7 mL (has no administration in time range)  gadobutrol  (GADAVIST ) 1 MMOL/ML injection 7 mL (7 mLs Intravenous Contrast Given 06/09/23 2153)    ED Course/ Medical Decision Making/ A&P                                 Medical Decision Making Amount and/or Complexity of Data Reviewed Labs: ordered. Radiology: ordered.   Patient is alert, afebrile, and hemodynamically stable in the ED in no acute distress.  Physical exam as noted above, with no acute ocular findings.  Vision noted to be 20/30 bilaterally, left is 20/120, right is 20/25.  Differential includes recurrent lymphoma, TIA (ABCD2 score 3 points), vitreous hemorrhage or detachment, amongst other diagnoses.  MRI imaging was ordered as well as labs.  CBC and CMP were unremarkable.  MRI orbits unremarkable.  MRI brain with signal abnormalities in the bilateral frontal lobes, most concerning for potential recurrence of lymphoma.  I spoke with patient to inform her about her workup and findings.  She is very understanding, states that she spoke with both her ophthalmologist and hematologist today, who should be able to both see her on  Monday.  I did offer her admission to have more expedited workup, but she politely declined and preferred outpatient management, which is reasonable.  I gave strict return precautions, especially vision worsens again, and patient was discharged in stable condition.  Patient seen in conjunction with Dr. Val Garin, who agreed with the above work-up and plan of care.        Final Clinical Impression(s) / ED Diagnoses Final diagnoses:  Visual  changes    Rx / DC Orders ED Discharge Orders     None         Lorain Robson, MD 06/10/23 Dorenda Gandy    Mozell Arias, MD 06/16/23 1340

## 2023-06-09 NOTE — ED Triage Notes (Signed)
 Pt bib [facility] coming from [facility] with c/o blurred/double vision in both eyes since 0500 today. Spoke with Ophthalmologist and was sent here. Recently in remission from lymphoma. LVO negative, no other deficits with EMS. GCS 15.  EMS vs:  140/70 90 HR 98% RA 118 CBG

## 2023-06-09 NOTE — Discharge Instructions (Addendum)
 You are seen in the ED today for for visual changes that were transient.  MRI imaging showed possible recurrence of lymphoma.  We offered admission, but you feel okay with having outpatient follow-up, which is reasonable.  Please follow-up with your oncologist on Monday.  Please return the ED for any emergency medical symptoms.  New/increased T2/FLAIR signal abnormality involving the  subcortical aspects of the anterior/inferior frontal lobes  bilaterally, right greater than left, with patchy post-contrast  enhancement. Additional new/increased signal abnormality at the  right cerebral peduncle without enhancement. Given patient history,  findings are most concerning for recurrent lymphoma.

## 2023-06-10 ENCOUNTER — Encounter: Payer: Self-pay | Admitting: Hematology & Oncology

## 2023-06-10 NOTE — ED Notes (Addendum)
 [facility] CANCELED PTAR called for transport back to [facility]. No ETA.

## 2023-06-10 NOTE — ED Notes (Signed)
 Pt ambulated to the bathroom. Pt able to ambulate without assistance.

## 2023-06-11 ENCOUNTER — Telehealth: Payer: Self-pay

## 2023-06-11 ENCOUNTER — Inpatient Hospital Stay: Admitting: Hematology & Oncology

## 2023-06-11 ENCOUNTER — Other Ambulatory Visit: Payer: Self-pay

## 2023-06-11 ENCOUNTER — Telehealth: Payer: Self-pay | Admitting: Pharmacy Technician

## 2023-06-11 ENCOUNTER — Encounter: Payer: Self-pay | Admitting: Hematology & Oncology

## 2023-06-11 ENCOUNTER — Encounter (HOSPITAL_COMMUNITY): Payer: Self-pay | Admitting: Hematology & Oncology

## 2023-06-11 ENCOUNTER — Inpatient Hospital Stay

## 2023-06-11 ENCOUNTER — Other Ambulatory Visit (HOSPITAL_COMMUNITY): Payer: Self-pay

## 2023-06-11 ENCOUNTER — Telehealth: Payer: Self-pay | Admitting: Pharmacist

## 2023-06-11 VITALS — BP 132/55 | HR 78 | Temp 98.1°F | Resp 18 | Ht 67.0 in | Wt 158.0 lb

## 2023-06-11 DIAGNOSIS — Z9221 Personal history of antineoplastic chemotherapy: Secondary | ICD-10-CM | POA: Diagnosis not present

## 2023-06-11 DIAGNOSIS — C8332 Diffuse large B-cell lymphoma, intrathoracic lymph nodes: Secondary | ICD-10-CM | POA: Diagnosis not present

## 2023-06-11 DIAGNOSIS — Z79899 Other long term (current) drug therapy: Secondary | ICD-10-CM | POA: Diagnosis not present

## 2023-06-11 DIAGNOSIS — D509 Iron deficiency anemia, unspecified: Secondary | ICD-10-CM | POA: Diagnosis not present

## 2023-06-11 DIAGNOSIS — C8330 Diffuse large B-cell lymphoma, unspecified site: Secondary | ICD-10-CM | POA: Diagnosis not present

## 2023-06-11 DIAGNOSIS — C8510 Unspecified B-cell lymphoma, unspecified site: Secondary | ICD-10-CM

## 2023-06-11 DIAGNOSIS — C8339 Primary central nervous system lymphoma: Secondary | ICD-10-CM

## 2023-06-11 DIAGNOSIS — Z9484 Stem cells transplant status: Secondary | ICD-10-CM | POA: Diagnosis not present

## 2023-06-11 DIAGNOSIS — Z5111 Encounter for antineoplastic chemotherapy: Secondary | ICD-10-CM | POA: Diagnosis not present

## 2023-06-11 LAB — CBC WITH DIFFERENTIAL (CANCER CENTER ONLY)
Abs Immature Granulocytes: 0.03 10*3/uL (ref 0.00–0.07)
Basophils Absolute: 0 10*3/uL (ref 0.0–0.1)
Basophils Relative: 0 %
Eosinophils Absolute: 0.1 10*3/uL (ref 0.0–0.5)
Eosinophils Relative: 1 %
HCT: 37.9 % (ref 36.0–46.0)
Hemoglobin: 12.8 g/dL (ref 12.0–15.0)
Immature Granulocytes: 1 %
Lymphocytes Relative: 27 %
Lymphs Abs: 1.7 10*3/uL (ref 0.7–4.0)
MCH: 31.8 pg (ref 26.0–34.0)
MCHC: 33.8 g/dL (ref 30.0–36.0)
MCV: 94 fL (ref 80.0–100.0)
Monocytes Absolute: 0.5 10*3/uL (ref 0.1–1.0)
Monocytes Relative: 8 %
Neutro Abs: 4 10*3/uL (ref 1.7–7.7)
Neutrophils Relative %: 63 %
Platelet Count: 221 10*3/uL (ref 150–400)
RBC: 4.03 MIL/uL (ref 3.87–5.11)
RDW: 11.9 % (ref 11.5–15.5)
WBC Count: 6.3 10*3/uL (ref 4.0–10.5)
nRBC: 0 % (ref 0.0–0.2)

## 2023-06-11 LAB — CMP (CANCER CENTER ONLY)
ALT: 17 U/L (ref 0–44)
AST: 14 U/L — ABNORMAL LOW (ref 15–41)
Albumin: 4.5 g/dL (ref 3.5–5.0)
Alkaline Phosphatase: 52 U/L (ref 38–126)
Anion gap: 7 (ref 5–15)
BUN: 14 mg/dL (ref 8–23)
CO2: 29 mmol/L (ref 22–32)
Calcium: 9.8 mg/dL (ref 8.9–10.3)
Chloride: 101 mmol/L (ref 98–111)
Creatinine: 0.65 mg/dL (ref 0.44–1.00)
GFR, Estimated: >60 mL/min (ref >60)
Glucose, Bld: 111 mg/dL — ABNORMAL HIGH (ref 70–99)
Potassium: 3.9 mmol/L (ref 3.5–5.1)
Sodium: 137 mmol/L (ref 135–145)
Total Bilirubin: 0.4 mg/dL (ref 0.0–1.2)
Total Protein: 6.3 g/dL — ABNORMAL LOW (ref 6.5–8.1)

## 2023-06-11 LAB — IRON AND IRON BINDING CAPACITY (CC-WL,HP ONLY)
Iron: 75 ug/dL (ref 28–170)
Saturation Ratios: 25 % (ref 10.4–31.8)
TIBC: 300 ug/dL (ref 250–450)
UIBC: 225 ug/dL (ref 148–442)

## 2023-06-11 LAB — TSH: TSH: 0.662 u[IU]/mL (ref 0.350–4.500)

## 2023-06-11 LAB — LACTATE DEHYDROGENASE: LDH: 153 U/L (ref 98–192)

## 2023-06-11 LAB — FERRITIN: Ferritin: 851 ng/mL — ABNORMAL HIGH (ref 11–307)

## 2023-06-11 MED ORDER — HEPARIN SOD (PORK) LOCK FLUSH 100 UNIT/ML IV SOLN
500.0000 [IU] | Freq: Once | INTRAVENOUS | Status: AC
Start: 2023-06-11 — End: 2023-06-11
  Administered 2023-06-11: 500 [IU] via INTRAVENOUS

## 2023-06-11 MED ORDER — SODIUM CHLORIDE 0.9% FLUSH
10.0000 mL | INTRAVENOUS | Status: DC | PRN
  Administered 2023-06-11: 10 mL via INTRAVENOUS

## 2023-06-11 MED ORDER — TEMOZOLOMIDE 100 MG PO CAPS
150.0000 mg/m2/d | ORAL_CAPSULE | Freq: Every day | ORAL | 5 refills | Status: DC
  Filled 2023-06-12: qty 15, 28d supply, fill #0

## 2023-06-11 MED ORDER — TEMOZOLOMIDE 100 MG PO CAPS
150.0000 mg/m2/d | ORAL_CAPSULE | Freq: Every day | ORAL | 5 refills | Status: DC
  Filled 2023-06-11: qty 15, 5d supply, fill #0

## 2023-06-11 NOTE — Progress Notes (Signed)
 Hematology and Oncology Follow Up Visit  Ariel [name] [id number] 1956-02-12 68 y.o. 06/11/2023   Principle Diagnosis:  Diffuse large cell non-Hodgkin's lymphoma-CNS involvement -- relapsed -- again!!   Past Therapy: Status post chemotherapy with R-ICE/R-MTV --  s/p cycle #4 MATRIX -- s/p cycle #4 -- start on 05/02/2021 --completed on 08/06/2021 Neulasta 6 mg subcu post chemotherapy MTV- s/p cycle #1 -- given on 01/22/204   Current Therapy:        Imbruvica/Rituxan -- started 05/02/2022, s/p cycle #5 -DC on 07/16/2022 --DC on 10/29/2022 High-dose methotrexate/Rituxan-cycle #6  -- started on 11/03/2022 - completed on 04/13/2023   Interim History:  Ariel [name] is here today for an early visit.  Unfortunately, Ariel [name] has relapsed again.  S1 been 2 months since Ariel [name] had her last dose of high-dose methotrexate/Rituxan.  I am incredibly disappointed by this.  I really thought that you Ariel [name] had done well and that Ariel [name] was going be in remission for a while.  Patient went to the ER over the weekend.  Ariel [name] has visual changes.  Ariel [name] had a MRI that was done on 06/09/2023.  This did show new abnormalities in the brain.  There is enhancement in the anterior/inferior frontal lobes bilaterally.  Ariel [name] had increased signal in the right cerebral peduncle.  All these were concerning for recurrent lymphoma.  Ariel [name] did not go to [place].  Ariel [name] saw her daughter in [place].  Ariel [name] had a wonderful time down there.  Ariel [name] was tired however.  Ariel [name] got back on Friday and began to have symptoms on Saturday.  Clearly, I think that this is an indicator that Ariel [name] has relapsed already.  At this point, our options are becoming limited.  I know that Ariel [name] has not had whole brain radiation as of yet.  Another option might be Rituxan/Temodar.  This could always be considered.  I will have to speak with [facility] to see if they would consider her for CAR-T therapy.  Ariel [name] would be amenable to this now.  However, it may take several weeks before CAR-T  could even be approved and obtained.  Ariel [name] has had no weakness.  There is been no bowel or bladder issues.  Ariel [name] has had some joint issues with her knees.  There is no swollen lymph nodes.  Ariel [name] has had no cough.  Ariel [name] has had no nausea or vomiting.  Ariel [name] has had no visual changes outside of the double vision that Ariel [name] had when Ariel [name] woke up Saturday morning.  Currently, I would have to say that her performance status is probably ECOG 1.    Medications:  Allergies as of 06/11/2023       Reactions   Decadron [dexamethasone] Other (See Comments)   Leg weakness Hallucinations   Plavix [clopidogrel] Hives, Swelling, Other (See Comments)   Lips became swollen   Prednisone Other (See Comments)   Steroids caused psychological issues  Hallucinations   Sulfa Antibiotics Itching, Rash   Codeine Nausea And Vomiting        Medication List        Accurate as of June 11, 2023  2:28 PM. If you have any questions, ask your nurse or doctor.          acetaminophen 500 MG tablet Commonly known as: TYLENOL Take 1,000 mg by mouth every 6 (six) hours as needed for headache, fever, moderate pain (pain score 4-6) or mild pain (pain score 1-3).   ondansetron 8 MG disintegrating tablet Commonly known as: ZOFRAN-ODT Take 1 tablet (  8 mg total) by mouth every 8 (eight) hours as needed for nausea or vomiting.   temozolomide 100 MG capsule Commonly known as: Temodar Take 3 capsules (300 mg total) by mouth daily for 5 days. May take on an empty stomach or at bedtime to decrease nausea & vomiting. Started by: [name]        Allergies:  Allergies  Allergen Reactions   Decadron [Dexamethasone] Other (See Comments)    Leg weakness Hallucinations   Plavix [Clopidogrel] Hives, Swelling and Other (See Comments)    Lips became swollen   Prednisone Other (See Comments)    Steroids caused psychological issues  Hallucinations   Sulfa Antibiotics Itching and Rash   Codeine Nausea And Vomiting     Past Medical History, Surgical history, Social history, and Family History were reviewed and updated.  Review of Systems: Review of Systems  Constitutional: Negative.   HENT: Negative.    Eyes: Negative.   Respiratory: Negative.    Cardiovascular: Negative.   Gastrointestinal: Negative.   Genitourinary: Negative.   Musculoskeletal: Negative.   Skin: Negative.   Neurological: Negative.   Endo/Heme/Allergies: Negative.   Psychiatric/Behavioral: Negative.       Physical Exam:  Vital signs are temperature 98.1.  Pulse 78.  Blood pressure 132/55.  Weight is 158 pounds.     Wt Readings from Last 3 Encounters:  06/11/23 158 lb (71.7 kg)  06/09/23 160 lb (72.6 kg)  05/08/23 160 lb (72.6 kg)    Physical Activity: Sufficiently Active (09/05/2022)   Exercise Vital Sign    Days of Exercise per Week: 6 days    Minutes of Exercise per Session: 40 min   Physical Exam Vitals reviewed.  HENT:     Head: Normocephalic and atraumatic.  Eyes:     Pupils: Pupils are equal, round, and reactive to light.  Cardiovascular:     Rate and Rhythm: Normal rate and regular rhythm.     Heart sounds: Normal heart sounds.  Pulmonary:     Effort: Pulmonary effort is normal.     Breath sounds: Normal breath sounds.  Abdominal:     General: Bowel sounds are normal.     Palpations: Abdomen is soft.  Musculoskeletal:        General: No tenderness or deformity. Normal range of motion.     Cervical back: Normal range of motion.  Lymphadenopathy:     Cervical: No cervical adenopathy.  Skin:    General: Skin is warm and dry.     Findings: No erythema or rash.  Neurological:     Mental Status: Ariel [name] is alert and oriented to person, place, and time.  Psychiatric:        Behavior: Behavior normal.        Thought Content: Thought content normal.        Judgment: Judgment normal.      Lab Results  Component Value Date   WBC 6.3 06/11/2023   HGB 12.8 06/11/2023   HCT 37.9 06/11/2023   MCV  94.0 06/11/2023   PLT 221 06/11/2023   Lab Results  Component Value Date   FERRITIN 711 (H) 05/31/2023   IRON 67 05/31/2023   TIBC 298 05/31/2023   UIBC 231 05/31/2023   IRONPCTSAT 23 05/31/2023   Lab Results  Component Value Date   RETICCTPCT 2.4 11/15/2021   RBC 4.03 06/11/2023   No results found for: "KPAFRELGTCHN", "LAMBDASER", "KAPLAMBRATIO" No results found for: "IGGSERUM", "IGA", "IGMSERUM" No results found for: "TOTALPROTELP", "  ALBUMINELP", "A1GS", "A2GS", "BETS", "BETA2SER", "GAMS", "MSPIKE", "SPEI"   Chemistry      Component Value Date/Time   NA 137 06/11/2023 1110   K 3.9 06/11/2023 1110   CL 101 06/11/2023 1110   CO2 29 06/11/2023 1110   BUN 14 06/11/2023 1110   CREATININE 0.65 06/11/2023 1110   CREATININE 0.68 11/18/2012 1439      Component Value Date/Time   CALCIUM 9.8 06/11/2023 1110   ALKPHOS 52 06/11/2023 1110   AST 14 (L) 06/11/2023 1110   ALT 17 06/11/2023 1110   BILITOT 0.4 06/11/2023 1110       Impression and Plan: Ariel [name] is a very pleasant 68 yo caucasian female with iron deficiency anemia and then was diagnosed with diffuse large cell non-Hodgkin's lymphoma that had traveled to her brain.  Ariel [name] just completed inpatient aggressive therapy with Rituxan/high-dose methotrexate.  Ariel [name] actually did incredibly well with this.  However, looks like Ariel [name] now has had another relapse.  Again, I will have to see about the possibility of CAR-T therapy.  In the meantime, I will treated with Rituxan/Temodar.  I will use Rituxan 500 mg/m every 28 days  I will use Temodar at 150 mg/m daily for 5 days.  Hopefully, we can control her disease.  We will see about getting her back to [facility] for evaluation for CAR-T therapy.  Another option might even be stem cell transplant.  Again, Ariel [name] still has a good quality of life.  Because of this, I still think that we can be aggressive.  I would like to have her come back to see me when Ariel [name] starts her second cycle of  treatment.     Ivor Mars, MD 4/14/20252:28 PM

## 2023-06-11 NOTE — Telephone Encounter (Signed)
 Received multiple MyChart messages and voicemail from patient. Pt stating that she had an MRI done over the weekend after having blurred vision and needed to discuss the results with Dr. Maria Shiner. Pt also complaining of extreme fatigue after returning from her trip to [place] .  Dr. Maria Shiner aware and appointments made for patient to have labs and be seen by Dr. Maria Shiner. Pt aware of appointments and plan. Pt appreciative of help.

## 2023-06-11 NOTE — Progress Notes (Signed)
 DISCONTINUE ON PATHWAY REGIMEN - Lymphoma and CLL  No Medical Intervention - Off Treatment.  PRIOR TREATMENT: Off Treatment  START OFF PATHWAY REGIMEN - Other   [id number]:Rituximab IV/SUBQ D1 q7 Days:   Cycle 1: A cycle is 7 days:     Rituximab-xxxx    Cycles 2 and beyond: A cycle is every 7 days:     Rituximab and hyaluronidase human   **Always confirm dose/schedule in your pharmacy ordering system**  Patient Characteristics: Intent of Therapy: Non-Curative / Palliative Intent, Discussed with Patient

## 2023-06-11 NOTE — Telephone Encounter (Signed)
 Oral Oncology Pharmacist Encounter  Received new prescription for Temodar (temozolomide) for the treatment of heavily pretreated, relapsed, CNS lymphoma in conjunction with rituximab, planned duration until disease progression or unacceptable drug toxicity.  CBC w/ Diff and CMP from 06/11/23 assessed, no relevant lab abnormalities requiring baseline dose adjustment required at this time. Dosing per MD - see MD note specifying dosing on 06/11/23. Prescription dose and frequency assessed for appropriateness.  Current medication list in Epic reviewed, no relevant/significant DDIs with Temodar identified.  Evaluated chart and no patient barriers to medication adherence noted.   Prescription has been e-scribed to the [facility] for benefits analysis and approval.  Oral Oncology Clinic will continue to follow for insurance authorization, copayment issues, initial counseling and start date.  Jude Norton, PharmD, BCPS, BCOP Hematology/Oncology Clinical Pharmacist [facility] and [facility] Oral Chemotherapy Navigation Clinics [phone] 06/11/2023 4:15 PM

## 2023-06-11 NOTE — Telephone Encounter (Signed)
 Oral Oncology Patient Advocate Encounter  After completing a benefits investigation, prior authorization for Temozolomide is not required at this time through [employer].  Patient's copay is $37.03.     Patty Benjaman Branch, CPhT Oncology Pharmacy Patient Advocate [facility] Direct Number: [phone] Fax: [phone]

## 2023-06-12 ENCOUNTER — Other Ambulatory Visit: Payer: Self-pay | Admitting: Pharmacy Technician

## 2023-06-12 ENCOUNTER — Other Ambulatory Visit (HOSPITAL_COMMUNITY): Payer: Self-pay

## 2023-06-12 ENCOUNTER — Encounter: Payer: Self-pay | Admitting: Hematology & Oncology

## 2023-06-12 ENCOUNTER — Other Ambulatory Visit: Payer: Self-pay

## 2023-06-12 ENCOUNTER — Other Ambulatory Visit (HOSPITAL_BASED_OUTPATIENT_CLINIC_OR_DEPARTMENT_OTHER): Payer: Self-pay

## 2023-06-12 NOTE — Progress Notes (Signed)
 Specialty Pharmacy Initial Fill Coordination Note  Ariel [name] is a 68 y.o. female contacted today regarding refills of specialty medication(s) Temozolomide (TEMODAR) .  Patient requested Delivery  on 06/14/23  to verified address [address] [place] [address]   Medication will be filled on 06/13/2023.   Patient is aware of $37.03 copayment.   Patty Benjaman Branch, CPhT Oncology Pharmacy Patient Advocate [facility] Direct Number: [phone] Fax: [phone]

## 2023-06-12 NOTE — Telephone Encounter (Signed)
 Oral Chemotherapy Pharmacist Encounter  I spoke with patient for overview of: Temodar (temozolomide) for the treatment of heavily pretreated, relapsed, CNS lymphoma in conjunction with rituximab, planned duration until disease progression or unacceptable drug toxicity.   Counseled on administration, dosing, side effects, monitoring, drug-food interactions, safe handling, storage, and disposal.  Patient will take Temodar 100mg  capsules, 3 capsules, 300mg  total daily dose, by mouth once daily, may take at bedtime and on an empty stomach to decrease nausea and vomiting.   Patient will take Temodar daily for 5 days on, 23 days off, and repeated.  Temodar start date: pending - patient will start first day of rituximab therapy   Adverse effects include but are not limited to: nausea, vomiting, GI upset, rash, and fatigue.  Patient will take Zofran 8mg  tablet, 1 tablet by mouth 30-60 min prior to Temodar dose to help decrease N/V.  Reviewed importance of keeping a medication schedule and plan for any missed doses. No barriers to medication adherence identified.  Medication reconciliation performed and medication/allergy list updated.  All questions answered.  Ariel [name] voiced understanding and appreciation.   Medication education handout placed in mail for patient. Patient knows to call the office with questions or concerns. Oral Chemotherapy Clinic phone number provided to patient.   Jude Norton, PharmD, BCPS, BCOP Hematology/Oncology Clinical Pharmacist [facility] and [facility] Oral Chemotherapy Navigation Clinics [phone] 06/12/2023 11:39 AM

## 2023-06-12 NOTE — Progress Notes (Signed)
 Oral Chemotherapy Pharmacist Encounter  Patient was counseled under telephone encounter from 06/11/23.  Jude Norton, PharmD, BCPS, BCOP Hematology/Oncology Clinical Pharmacist [facility] and [facility] Oral Chemotherapy Navigation Clinics [phone] 06/12/2023 11:52 AM

## 2023-06-13 ENCOUNTER — Other Ambulatory Visit: Payer: Self-pay

## 2023-06-14 ENCOUNTER — Inpatient Hospital Stay

## 2023-06-14 DIAGNOSIS — Z9484 Stem cells transplant status: Secondary | ICD-10-CM | POA: Diagnosis not present

## 2023-06-14 DIAGNOSIS — Z79899 Other long term (current) drug therapy: Secondary | ICD-10-CM | POA: Diagnosis not present

## 2023-06-14 DIAGNOSIS — Z5111 Encounter for antineoplastic chemotherapy: Secondary | ICD-10-CM | POA: Diagnosis not present

## 2023-06-14 DIAGNOSIS — C8332 Diffuse large B-cell lymphoma, intrathoracic lymph nodes: Secondary | ICD-10-CM

## 2023-06-14 DIAGNOSIS — C8510 Unspecified B-cell lymphoma, unspecified site: Secondary | ICD-10-CM

## 2023-06-14 DIAGNOSIS — Z9221 Personal history of antineoplastic chemotherapy: Secondary | ICD-10-CM | POA: Diagnosis not present

## 2023-06-14 DIAGNOSIS — D509 Iron deficiency anemia, unspecified: Secondary | ICD-10-CM | POA: Diagnosis not present

## 2023-06-14 DIAGNOSIS — C8330 Diffuse large B-cell lymphoma, unspecified site: Secondary | ICD-10-CM | POA: Diagnosis not present

## 2023-06-14 LAB — CMP (CANCER CENTER ONLY)
ALT: 21 U/L (ref 0–44)
AST: 18 U/L (ref 15–41)
Albumin: 4.5 g/dL (ref 3.5–5.0)
Alkaline Phosphatase: 51 U/L (ref 38–126)
Anion gap: 7 (ref 5–15)
BUN: 16 mg/dL (ref 8–23)
CO2: 29 mmol/L (ref 22–32)
Calcium: 9.9 mg/dL (ref 8.9–10.3)
Chloride: 97 mmol/L — ABNORMAL LOW (ref 98–111)
Creatinine: 0.71 mg/dL (ref 0.44–1.00)
GFR, Estimated: >60 mL/min (ref >60)
Glucose, Bld: 131 mg/dL — ABNORMAL HIGH (ref 70–99)
Potassium: 4 mmol/L (ref 3.5–5.1)
Sodium: 133 mmol/L — ABNORMAL LOW (ref 135–145)
Total Bilirubin: 0.4 mg/dL (ref 0.0–1.2)
Total Protein: 6.2 g/dL — ABNORMAL LOW (ref 6.5–8.1)

## 2023-06-14 LAB — CBC WITH DIFFERENTIAL (CANCER CENTER ONLY)
Abs Immature Granulocytes: 0.03 10*3/uL (ref 0.00–0.07)
Basophils Absolute: 0 10*3/uL (ref 0.0–0.1)
Basophils Relative: 0 %
Eosinophils Absolute: 0 10*3/uL (ref 0.0–0.5)
Eosinophils Relative: 1 %
HCT: 38 % (ref 36.0–46.0)
Hemoglobin: 12.8 g/dL (ref 12.0–15.0)
Immature Granulocytes: 1 %
Lymphocytes Relative: 24 %
Lymphs Abs: 1.5 10*3/uL (ref 0.7–4.0)
MCH: 31.4 pg (ref 26.0–34.0)
MCHC: 33.7 g/dL (ref 30.0–36.0)
MCV: 93.1 fL (ref 80.0–100.0)
Monocytes Absolute: 0.5 10*3/uL (ref 0.1–1.0)
Monocytes Relative: 9 %
Neutro Abs: 4 10*3/uL (ref 1.7–7.7)
Neutrophils Relative %: 65 %
Platelet Count: 211 10*3/uL (ref 150–400)
RBC: 4.08 MIL/uL (ref 3.87–5.11)
RDW: 11.7 % (ref 11.5–15.5)
WBC Count: 6 10*3/uL (ref 4.0–10.5)
nRBC: 0 % (ref 0.0–0.2)

## 2023-06-14 LAB — LACTATE DEHYDROGENASE: LDH: 143 U/L (ref 98–192)

## 2023-06-14 MED ORDER — RITUXIMAB-PVVR CHEMO 500 MG/50ML IV SOLN
500.0000 mg/m2 | Freq: Once | INTRAVENOUS | Status: AC
  Administered 2023-06-14: 900 mg via INTRAVENOUS
  Filled 2023-06-14: qty 50

## 2023-06-14 MED ORDER — LIDOCAINE-PRILOCAINE 2.5-2.5 % EX CREA: TOPICAL_CREAM | CUTANEOUS | 3 refills | Status: DC

## 2023-06-14 MED ORDER — ALTEPLASE 2 MG IJ SOLR: 2.0000 mg | Freq: Once | INTRAMUSCULAR | Status: DC | PRN

## 2023-06-14 MED ORDER — DIPHENHYDRAMINE HCL 25 MG PO CAPS: 50.0000 mg | ORAL_CAPSULE | Freq: Once | ORAL | Status: DC

## 2023-06-14 MED ORDER — HEPARIN SOD (PORK) LOCK FLUSH 100 UNIT/ML IV SOLN
250.0000 [IU] | Freq: Once | INTRAVENOUS | Status: AC | PRN
  Administered 2023-06-14: 500 [IU]

## 2023-06-14 MED ORDER — SODIUM CHLORIDE 0.9% FLUSH: 3.0000 mL | INTRAVENOUS | Status: DC | PRN

## 2023-06-14 MED ORDER — HEPARIN SOD (PORK) LOCK FLUSH 100 UNIT/ML IV SOLN
500.0000 [IU] | Freq: Once | INTRAVENOUS | Status: AC | PRN
  Administered 2023-06-14: 500 [IU]

## 2023-06-14 MED ORDER — ACETAMINOPHEN 325 MG PO TABS: 650.0000 mg | ORAL_TABLET | Freq: Once | ORAL | Status: DC

## 2023-06-14 MED ORDER — SODIUM CHLORIDE 0.9 % IV SOLN: INTRAVENOUS | Status: DC

## 2023-06-14 MED ORDER — SODIUM CHLORIDE 0.9 % IV SOLN
500.0000 mg/m2 | Freq: Once | INTRAVENOUS | Status: DC
  Filled 2023-06-14: qty 90

## 2023-06-14 MED ORDER — SODIUM CHLORIDE 0.9% FLUSH
10.0000 mL | INTRAVENOUS | Status: DC | PRN
  Administered 2023-06-14: 10 mL

## 2023-06-14 MED ORDER — SODIUM CHLORIDE 0.9 % IV SOLN
500.0000 mg/m2 | Freq: Once | INTRAVENOUS | Status: AC
  Administered 2023-06-14: 900 mg via INTRAVENOUS
  Filled 2023-06-14: qty 90

## 2023-06-14 NOTE — Progress Notes (Signed)
 OK to treat without taking the Benadryl per Dr. Maria Shiner.

## 2023-06-14 NOTE — Patient Instructions (Signed)
 [facility] - A DEPT OF [facility]  Discharge Instructions: Thank you for choosing [facility] to provide your oncology and hematology care.   If you have a lab appointment with the Cancer Center, please go directly to the Cancer Center and check in at the registration area.  Wear comfortable clothing and clothing appropriate for easy access to any Portacath or PICC line.   We strive to give you quality time with your provider. You may need to reschedule your appointment if you arrive late (15 or more minutes).  Arriving late affects you and other patients whose appointments are after yours.  Also, if you miss three or more appointments without notifying the office, you may be dismissed from the clinic at the provider's discretion.      For prescription refill requests, have your pharmacy contact our office and allow 72 hours for refills to be completed.    Today you received the following chemotherapy and/or immunotherapy agents Rituxan     To help prevent nausea and vomiting after your treatment, we encourage you to take your nausea medication as directed.  BELOW ARE SYMPTOMS THAT SHOULD BE REPORTED IMMEDIATELY: *FEVER GREATER THAN 100.4 F (38 C) OR HIGHER *CHILLS OR SWEATING *NAUSEA AND VOMITING THAT IS NOT CONTROLLED WITH YOUR NAUSEA MEDICATION *UNUSUAL SHORTNESS OF BREATH *UNUSUAL BRUISING OR BLEEDING *URINARY PROBLEMS (pain or burning when urinating, or frequent urination) *BOWEL PROBLEMS (unusual diarrhea, constipation, pain near the anus) TENDERNESS IN MOUTH AND THROAT WITH OR WITHOUT PRESENCE OF ULCERS (sore throat, sores in mouth, or a toothache) UNUSUAL RASH, SWELLING OR PAIN  UNUSUAL VAGINAL DISCHARGE OR ITCHING   Items with * indicate a potential emergency and should be followed up as soon as possible or go to the Emergency Department if any problems should occur.  Please show the CHEMOTHERAPY ALERT CARD or IMMUNOTHERAPY ALERT  CARD at check-in to the Emergency Department and triage nurse. Should you have questions after your visit or need to cancel or reschedule your appointment, please contact [facility] - A DEPT OF [facility]  [phone] and follow the prompts.  Office hours are 8:00 a.m. to 4:30 p.m. Monday - Friday. Please note that voicemails left after 4:00 p.m. may not be returned until the following business day.  We are closed weekends and major holidays. You have access to a nurse at all times for urgent questions. Please call the main number to the clinic [phone] and follow the prompts.  For any non-urgent questions, you may also contact your provider using MyChart. We now offer e-Visits for anyone 63 and older to request care online for non-urgent symptoms. For details visit [email or web address].   Also download the MyChart app! Go to the app store, search "MyChart", open the app, select [place], and log in with your MyChart username and password.

## 2023-06-14 NOTE — Progress Notes (Signed)
 Rapid Infusion Rituximab Pharmacist Evaluation  Ariel [name] is a 68 y.o. female being treated with rituximab for NHL. This patient may be considered for RIR.   A pharmacist has verified the patient tolerated rituximab infusions per the [facility] standard infusion protocol without grade 3-4 infusion reactions. The treatment plan will be updated to reflect RIR if the patient qualifies per the checklist below:   Age > 83 years old Yes   Clinically significant cardiovascular disease No   Circulating lymphocyte count < 5000/uL prior to cycle two No  Lab Results  Component Value Date   LYMPHSABS 1.5 06/14/2023    Prior documented grade 3-4 infusion reaction to rituximab No   Prior documented grade 1-2 infusion reaction to rituximab (If YES, Pharmacist will confirm with Physician if patient is still a candidate for RIR) No   Previous rituximab infusion within the past 6 months Yes   Treatment Plan updated orders to reflect RIR Yes    [name] does meet the criteria for Rapid Infusion Rituximab. This patient is going to be switched to rapid infusion rituximab.   De Libman, Wenda Hallmark 06/14/23 11:07 AM

## 2023-06-18 ENCOUNTER — Encounter: Payer: Self-pay | Admitting: Hematology & Oncology

## 2023-06-18 ENCOUNTER — Other Ambulatory Visit: Payer: Self-pay

## 2023-06-18 DIAGNOSIS — C8510 Unspecified B-cell lymphoma, unspecified site: Secondary | ICD-10-CM

## 2023-06-20 ENCOUNTER — Other Ambulatory Visit

## 2023-06-20 ENCOUNTER — Inpatient Hospital Stay

## 2023-06-20 ENCOUNTER — Ambulatory Visit: Admitting: Hematology & Oncology

## 2023-06-25 ENCOUNTER — Encounter (HOSPITAL_COMMUNITY)
Admission: RE | Admit: 2023-06-25 | Discharge: 2023-06-25 | Disposition: A | Discharge status: Home / Self Care | Source: Ambulatory Visit | Attending: Hematology & Oncology | Admitting: Hematology & Oncology

## 2023-06-25 DIAGNOSIS — C8510 Unspecified B-cell lymphoma, unspecified site: Secondary | ICD-10-CM | POA: Diagnosis not present

## 2023-06-25 DIAGNOSIS — C8590 Non-Hodgkin lymphoma, unspecified, unspecified site: Secondary | ICD-10-CM | POA: Diagnosis not present

## 2023-06-25 DIAGNOSIS — I700 Atherosclerosis of aorta: Secondary | ICD-10-CM | POA: Diagnosis not present

## 2023-06-25 DIAGNOSIS — Z96643 Presence of artificial hip joint, bilateral: Secondary | ICD-10-CM | POA: Diagnosis not present

## 2023-06-25 LAB — GLUCOSE, CAPILLARY: Glucose-Capillary: 108 mg/dL — ABNORMAL HIGH (ref 70–99)

## 2023-06-25 MED ORDER — FLUDEOXYGLUCOSE F - 18 (FDG) INJECTION
7.8300 | Freq: Once | INTRAVENOUS | Status: AC | PRN
  Administered 2023-06-25: 7.83 via INTRAVENOUS

## 2023-06-28 ENCOUNTER — Encounter: Payer: Self-pay | Admitting: Hematology & Oncology

## 2023-06-28 ENCOUNTER — Other Ambulatory Visit: Payer: Self-pay | Admitting: *Deleted

## 2023-06-28 ENCOUNTER — Other Ambulatory Visit: Payer: Self-pay

## 2023-06-28 ENCOUNTER — Other Ambulatory Visit (HOSPITAL_COMMUNITY): Payer: Self-pay

## 2023-06-28 ENCOUNTER — Telehealth: Payer: Self-pay | Admitting: *Deleted

## 2023-06-28 DIAGNOSIS — D496 Neoplasm of unspecified behavior of brain: Secondary | ICD-10-CM

## 2023-06-28 DIAGNOSIS — C8510 Unspecified B-cell lymphoma, unspecified site: Secondary | ICD-10-CM

## 2023-06-28 DIAGNOSIS — R112 Nausea with vomiting, unspecified: Secondary | ICD-10-CM

## 2023-06-28 MED ORDER — PROCHLORPERAZINE MALEATE 10 MG PO TABS: 10.0000 mg | ORAL_TABLET | Freq: Four times a day (QID) | ORAL | 1 refills | Status: DC | PRN

## 2023-06-28 NOTE — Progress Notes (Signed)
 Specialty Services Program has been paused, pending possible therapy change. Patient reported that she became very sick after taking this medication. She has mentioned this to her provider on 06/28/23 before calling Specialty Pharmacy. Patient will call [facility] after treatment plan has been determined.

## 2023-06-28 NOTE — Telephone Encounter (Signed)
 This nurse has been corresponding with this patient through MyChart and phone calls today. I called patient and discussed Temodar  side effects. Patient stated,"I didn't know that Dr. Maria Shiner wanted me to continue this drug. When I took it, I had severe nausea and vomiting, weakness, fatigue and constipation. I took the pills from 4/18 - 4/22. The first three days of taking the pills, I vomited them back up. Day 4 and 5, I kept them down. The Zofran  that goes under my tongue, is sweet tasting, and that's what makes me vomit." Per Dr. Maria Shiner, please call in Compazine  10 mg Q6 prn. I E-scribed this medicine to [facility]. Patient is aware of the new prescription. Patient verbalized understanding at the end of the conversation.

## 2023-07-02 ENCOUNTER — Other Ambulatory Visit: Payer: Self-pay

## 2023-07-03 ENCOUNTER — Telehealth: Payer: Self-pay

## 2023-07-03 NOTE — Telephone Encounter (Signed)
-----   Message from Ariel [name] sent at 07/03/2023 12:53 PM EDT ----- Please call and let her know that the PET scan does not show any evidence of lymphoma in the body.  [name]

## 2023-07-03 NOTE — Telephone Encounter (Signed)
 Called patient to let her know that the PET scan does not show any evidence of lymphoma in the body.  Patient verbalized understanding and gratitude.

## 2023-07-04 ENCOUNTER — Encounter: Payer: Self-pay | Admitting: Hematology & Oncology

## 2023-07-11 ENCOUNTER — Other Ambulatory Visit: Payer: Self-pay | Admitting: Hematology & Oncology

## 2023-07-11 DIAGNOSIS — C8332 Diffuse large B-cell lymphoma, intrathoracic lymph nodes: Secondary | ICD-10-CM

## 2023-07-11 DIAGNOSIS — C8510 Unspecified B-cell lymphoma, unspecified site: Secondary | ICD-10-CM

## 2023-07-12 ENCOUNTER — Ambulatory Visit (HOSPITAL_BASED_OUTPATIENT_CLINIC_OR_DEPARTMENT_OTHER)
Admission: RE | Admit: 2023-07-12 | Discharge: 2023-07-12 | Disposition: A | Discharge status: Home / Self Care | Source: Ambulatory Visit | Attending: Medical Oncology | Admitting: Medical Oncology

## 2023-07-12 ENCOUNTER — Inpatient Hospital Stay

## 2023-07-12 ENCOUNTER — Inpatient Hospital Stay: Attending: Internal Medicine

## 2023-07-12 ENCOUNTER — Other Ambulatory Visit: Payer: Self-pay | Admitting: Medical Oncology

## 2023-07-12 ENCOUNTER — Other Ambulatory Visit

## 2023-07-12 ENCOUNTER — Encounter: Payer: Self-pay | Admitting: Hematology & Oncology

## 2023-07-12 ENCOUNTER — Encounter: Payer: Self-pay | Admitting: Medical Oncology

## 2023-07-12 ENCOUNTER — Ambulatory Visit: Payer: Self-pay | Admitting: Medical Oncology

## 2023-07-12 ENCOUNTER — Other Ambulatory Visit: Payer: Self-pay

## 2023-07-12 ENCOUNTER — Inpatient Hospital Stay: Attending: Internal Medicine | Admitting: Medical Oncology

## 2023-07-12 ENCOUNTER — Other Ambulatory Visit: Payer: Self-pay | Admitting: *Deleted

## 2023-07-12 ENCOUNTER — Ambulatory Visit: Payer: Self-pay | Admitting: Hematology & Oncology

## 2023-07-12 VITALS — BP 128/56 | HR 79 | Temp 98.1°F | Resp 16 | Ht 67.0 in | Wt 156.0 lb

## 2023-07-12 DIAGNOSIS — T451X5A Adverse effect of antineoplastic and immunosuppressive drugs, initial encounter: Secondary | ICD-10-CM | POA: Insufficient documentation

## 2023-07-12 DIAGNOSIS — D6481 Anemia due to antineoplastic chemotherapy: Secondary | ICD-10-CM | POA: Diagnosis not present

## 2023-07-12 DIAGNOSIS — C8332 Diffuse large B-cell lymphoma, intrathoracic lymph nodes: Secondary | ICD-10-CM

## 2023-07-12 DIAGNOSIS — D509 Iron deficiency anemia, unspecified: Secondary | ICD-10-CM | POA: Insufficient documentation

## 2023-07-12 DIAGNOSIS — R051 Acute cough: Secondary | ICD-10-CM | POA: Diagnosis not present

## 2023-07-12 DIAGNOSIS — Z79899 Other long term (current) drug therapy: Secondary | ICD-10-CM | POA: Insufficient documentation

## 2023-07-12 DIAGNOSIS — R0602 Shortness of breath: Secondary | ICD-10-CM | POA: Insufficient documentation

## 2023-07-12 DIAGNOSIS — C8330 Diffuse large B-cell lymphoma, unspecified site: Secondary | ICD-10-CM | POA: Insufficient documentation

## 2023-07-12 DIAGNOSIS — R059 Cough, unspecified: Secondary | ICD-10-CM | POA: Diagnosis not present

## 2023-07-12 DIAGNOSIS — Z5111 Encounter for antineoplastic chemotherapy: Secondary | ICD-10-CM | POA: Diagnosis not present

## 2023-07-12 DIAGNOSIS — N3000 Acute cystitis without hematuria: Secondary | ICD-10-CM

## 2023-07-12 DIAGNOSIS — C8510 Unspecified B-cell lymphoma, unspecified site: Secondary | ICD-10-CM

## 2023-07-12 LAB — CMP (CANCER CENTER ONLY)
ALT: 24 U/L (ref 0–44)
AST: 15 U/L (ref 15–41)
Albumin: 4.5 g/dL (ref 3.5–5.0)
Alkaline Phosphatase: 58 U/L (ref 38–126)
Anion gap: 9 (ref 5–15)
BUN: 15 mg/dL (ref 8–23)
CO2: 28 mmol/L (ref 22–32)
Calcium: 9.8 mg/dL (ref 8.9–10.3)
Chloride: 100 mmol/L (ref 98–111)
Creatinine: 0.66 mg/dL (ref 0.44–1.00)
GFR, Estimated: >60 mL/min (ref >60)
Glucose, Bld: 148 mg/dL — ABNORMAL HIGH (ref 70–99)
Potassium: 3.6 mmol/L (ref 3.5–5.1)
Sodium: 137 mmol/L (ref 135–145)
Total Bilirubin: 0.4 mg/dL (ref 0.0–1.2)
Total Protein: 6.5 g/dL (ref 6.5–8.1)

## 2023-07-12 LAB — URINALYSIS, COMPLETE (UACMP) WITH MICROSCOPIC
Bacteria, UA: NONE SEEN
Bilirubin Urine: NEGATIVE
Glucose, UA: NEGATIVE mg/dL
Ketones, ur: NEGATIVE mg/dL
Leukocytes,Ua: NEGATIVE
Nitrite: NEGATIVE
Protein, ur: 100 mg/dL — AB
RBC / HPF: NONE SEEN RBC/hpf (ref 0–5)
Specific Gravity, Urine: 1.02 (ref 1.005–1.030)
pH: 6.5 (ref 5.0–8.0)

## 2023-07-12 LAB — CBC WITH DIFFERENTIAL (CANCER CENTER ONLY)
Abs Immature Granulocytes: 0.03 10*3/uL (ref 0.00–0.07)
Basophils Absolute: 0 10*3/uL (ref 0.0–0.1)
Basophils Relative: 0 %
Eosinophils Absolute: 0.1 10*3/uL (ref 0.0–0.5)
Eosinophils Relative: 2 %
HCT: 33.4 % — ABNORMAL LOW (ref 36.0–46.0)
Hemoglobin: 11.5 g/dL — ABNORMAL LOW (ref 12.0–15.0)
Immature Granulocytes: 1 %
Lymphocytes Relative: 30 %
Lymphs Abs: 1.7 10*3/uL (ref 0.7–4.0)
MCH: 31.9 pg (ref 26.0–34.0)
MCHC: 34.4 g/dL (ref 30.0–36.0)
MCV: 92.5 fL (ref 80.0–100.0)
Monocytes Absolute: 0.6 10*3/uL (ref 0.1–1.0)
Monocytes Relative: 11 %
Neutro Abs: 3.2 10*3/uL (ref 1.7–7.7)
Neutrophils Relative %: 56 %
Platelet Count: 83 10*3/uL — ABNORMAL LOW (ref 150–400)
RBC: 3.61 MIL/uL — ABNORMAL LOW (ref 3.87–5.11)
RDW: 13.1 % (ref 11.5–15.5)
WBC Count: 5.7 10*3/uL (ref 4.0–10.5)
nRBC: 0 % (ref 0.0–0.2)

## 2023-07-12 LAB — LACTATE DEHYDROGENASE: LDH: 157 U/L (ref 98–192)

## 2023-07-12 MED ORDER — HEPARIN SOD (PORK) LOCK FLUSH 100 UNIT/ML IV SOLN
500.0000 [IU] | Freq: Once | INTRAVENOUS | Status: AC
Start: 2023-07-12 — End: 2023-07-12
  Administered 2023-07-12: 500 [IU] via INTRAVENOUS

## 2023-07-12 MED ORDER — SODIUM CHLORIDE 0.9% FLUSH
10.0000 mL | Freq: Once | INTRAVENOUS | Status: AC
Start: 2023-07-12 — End: 2023-07-12
  Administered 2023-07-12: 10 mL via INTRAVENOUS

## 2023-07-12 MED ORDER — ALBUTEROL SULFATE HFA 108 (90 BASE) MCG/ACT IN AERS: 2.0000 | INHALATION_SPRAY | Freq: Four times a day (QID) | RESPIRATORY_TRACT | 2 refills | Status: AC | PRN

## 2023-07-12 MED ORDER — DOXYCYCLINE HYCLATE 100 MG PO TABS: 100.0000 mg | ORAL_TABLET | Freq: Two times a day (BID) | ORAL | 0 refills | Status: AC

## 2023-07-12 NOTE — Patient Instructions (Signed)

## 2023-07-12 NOTE — Addendum Note (Signed)
 Addended by: Huntley Mai on: 07/12/2023 10:27 AM   Modules accepted: Orders

## 2023-07-12 NOTE — Progress Notes (Signed)
 Hematology and Oncology Follow Up Visit  Ariel [name] [id number] [birth year] 68 y.o. 07/12/2023   Principle Diagnosis:  Diffuse large cell non-Hodgkin's lymphoma-CNS involvement -- relapsed --    Past Therapy: Status post chemotherapy with R-ICE/R-MTV --  s/p cycle #4 MATRIX -- s/p cycle #4 -- start on 05/02/2021 --completed on 08/06/2021 Neulasta  6 mg subcu post chemotherapy MTV- s/p cycle #1 -- given on 01/22/204   Current Therapy:        Imbruvica /Rituxan  -- started 05/02/2022, s/p cycle #5 -DC on 07/16/2022 --DC on 10/29/2022 High-dose methotrexate /Rituxan -cycle #6  -- started on 11/03/2022 - completed on 04/13/2023   Interim History:  Ariel [name] is here today for follow up.  Unfortunately, she has relapsed again.  It has been 1 month since she had her last dose of high-dose methotrexate /Rituxan  (06/14/2023).  Patient went to the ER over the weekend.  She has visual changes.  She had a MRI that was done on 06/09/2023.  This did show new abnormalities in the brain.  There is enhancement in the anterior/inferior frontal lobes bilaterally.  She had increased signal in the right cerebral peduncle.  All these were concerning for recurrent lymphoma. She is now on Rituxan /Temodar .   Today she states that she is unable to tolerate the Temodar . This caused significant nausea, fatigue, and general misery. She was only able to tolerate it for about 3 days before she stopped it. Around that time she developed a dry cough. Cough is significant in nature. No hemoptysis, fever, SOB, peripheral edema, significant weight gain. She has not tried anything for symptoms. No sick contacts. She also reports some darker urine without dysuria, abdominal pain or urinary frequency.   She has had no weakness.  There is been no bowel issues.  She has had some joint issues with her knees.  There is no swollen lymph nodes.  She has had no cough.  She has had no nausea or vomiting.  She has had no visual changes outside of  the double vision that she had when she woke up Saturday morning.  Currently, I would have to say that her performance status is probably ECOG 1.   Wt Readings from Last 3 Encounters:  07/12/23 156 lb (70.8 kg)  06/11/23 158 lb (71.7 kg)  06/09/23 160 lb (72.6 kg)     Medications:  Allergies as of 07/12/2023       Reactions   Decadron  [dexamethasone ] Other (See Comments)   Leg weakness Hallucinations   Plavix  [clopidogrel ] Hives, Swelling, Other (See Comments)   Lips became swollen   Prednisone Other (See Comments)   Steroids caused psychological issues  Hallucinations   Sulfa  Antibiotics Itching, Rash   Codeine Nausea And Vomiting        Medication List        Accurate as of Jul 12, 2023 10:14 AM. If you have any questions, ask your nurse or doctor.          acetaminophen  500 MG tablet Commonly known as: TYLENOL  Take 1,000 mg by mouth every 6 (six) hours as needed for headache, fever, moderate pain (pain score 4-6) or mild pain (pain score 1-3).   lidocaine -prilocaine  cream Commonly known as: EMLA  Apply to affected area once   ondansetron  8 MG disintegrating tablet Commonly known as: ZOFRAN -ODT Take 1 tablet (8 mg total) by mouth every 8 (eight) hours as needed for nausea or vomiting.   prochlorperazine  10 MG tablet Commonly known as: COMPAZINE  Take 1 tablet (10 mg total) by mouth  every 6 (six) hours as needed for nausea or vomiting.   temozolomide  100 MG capsule Commonly known as: Temodar  Take 3 capsules (300 mg total) by mouth daily. Take for 5 days on, 23 days off. Repeat every 28 days. May take on an empty stomach or at bedtime to decrease nausea & vomiting.        Allergies:  Allergies  Allergen Reactions   Decadron  [Dexamethasone ] Other (See Comments)    Leg weakness Hallucinations   Plavix  [Clopidogrel ] Hives, Swelling and Other (See Comments)    Lips became swollen   Prednisone Other (See Comments)    Steroids caused psychological issues   Hallucinations   Sulfa  Antibiotics Itching and Rash   Codeine Nausea And Vomiting    Past Medical History, Surgical history, Social history, and Family History were reviewed and updated.  Review of Systems: Review of Systems  Constitutional: Negative.   HENT: Negative.    Eyes: Negative.   Respiratory: Negative.    Cardiovascular: Negative.   Gastrointestinal: Negative.   Genitourinary: Negative.   Musculoskeletal: Negative.   Skin: Negative.   Neurological: Negative.   Endo/Heme/Allergies: Negative.   Psychiatric/Behavioral: Negative.       Physical Exam: Vitals:   07/12/23 0931  BP: (!) 128/56  Pulse: 79  Resp: 16  Temp: 98.1 F (36.7 C)  SpO2: 98%     Wt Readings from Last 3 Encounters:  07/12/23 156 lb (70.8 kg)  06/11/23 158 lb (71.7 kg)  06/09/23 160 lb (72.6 kg)    Physical Activity: Sufficiently Active (09/05/2022)   Exercise Vital Sign    Days of Exercise per Week: 6 days    Minutes of Exercise per Session: 40 min   Physical Exam Vitals reviewed.  HENT:     Head: Normocephalic and atraumatic.  Eyes:     Pupils: Pupils are equal, round, and reactive to light.  Cardiovascular:     Rate and Rhythm: Normal rate and regular rhythm.     Heart sounds: Normal heart sounds.  Pulmonary:     Effort: Pulmonary effort is normal.     Breath sounds: Normal breath sounds.  Abdominal:     General: Bowel sounds are normal.     Palpations: Abdomen is soft.  Musculoskeletal:        General: No tenderness or deformity. Normal range of motion.     Cervical back: Normal range of motion.  Lymphadenopathy:     Cervical: No cervical adenopathy.  Skin:    General: Skin is warm and dry.     Findings: No erythema or rash.  Neurological:     Mental Status: She is alert and oriented to person, place, and time.  Psychiatric:        Behavior: Behavior normal.        Thought Content: Thought content normal.        Judgment: Judgment normal.      Lab Results   Component Value Date   WBC 5.7 07/12/2023   HGB 11.5 (L) 07/12/2023   HCT 33.4 (L) 07/12/2023   MCV 92.5 07/12/2023   PLT 83 (L) 07/12/2023   Lab Results  Component Value Date   FERRITIN 851 (H) 06/11/2023   IRON 75 06/11/2023   TIBC 300 06/11/2023   UIBC 225 06/11/2023   IRONPCTSAT 25 06/11/2023   Lab Results  Component Value Date   RETICCTPCT 2.4 11/15/2021   RBC 3.61 (L) 07/12/2023   No results found for: "KPAFRELGTCHN", "LAMBDASER", "KAPLAMBRATIO" No results found  for: "IGGSERUM", "IGA", "IGMSERUM" No results found for: "TOTALPROTELP", "ALBUMINELP", "A1GS", "A2GS", "BETS", "BETA2SER", "GAMS", "MSPIKE", "SPEI"   Chemistry      Component Value Date/Time   NA 137 07/12/2023 0853   K 3.6 07/12/2023 0853   CL 100 07/12/2023 0853   CO2 28 07/12/2023 0853   BUN 15 07/12/2023 0853   CREATININE 0.66 07/12/2023 0853   CREATININE 0.68 11/18/2012 1439      Component Value Date/Time   CALCIUM  9.8 07/12/2023 0853   ALKPHOS 58 07/12/2023 0853   AST 15 07/12/2023 0853   ALT 24 07/12/2023 0853   BILITOT 0.4 07/12/2023 0853     Encounter Diagnoses  Name Primary?   SOB (shortness of breath) Yes   Acute cough    Anemia due to antineoplastic chemotherapy    Impression and Plan: Ms. Thurmon is a very pleasant 68 yo caucasian female with iron deficiency anemia and then was diagnosed with diffuse large cell non-Hodgkin's lymphoma that had traveled to her brain.  She completed inpatient aggressive therapy with Rituxan /high-dose methotrexate .  She unfortunately has had another relapse.Investigating the possibility of CAR-T therapy. In the meantime, she is being treated with Rituxan /Temodar .   Rituxan  500 mg/m every 28 days  and Temodar  at 150 mg/m daily for 5 days.  She has been unable to tolerate the Temodar . In addition I am concerned with her new cough as pneumocystic pneumonia is a potential risk of this medication. She also does not feel up for treatment today with potential UTI  and lower platelet level. UA/Culture pending. Chest x ray pending.   HOLDING TX today due to potential infection RTC anytime next week for MD, port labs (CBC w/, CMP, LHD), treatment      Sharla Davis, PA-C 5/15/202510:14 AM

## 2023-07-13 ENCOUNTER — Other Ambulatory Visit: Payer: Self-pay | Admitting: *Deleted

## 2023-07-13 DIAGNOSIS — D649 Anemia, unspecified: Secondary | ICD-10-CM

## 2023-07-13 DIAGNOSIS — N390 Urinary tract infection, site not specified: Secondary | ICD-10-CM

## 2023-07-13 DIAGNOSIS — Z95828 Presence of other vascular implants and grafts: Secondary | ICD-10-CM

## 2023-07-13 DIAGNOSIS — D500 Iron deficiency anemia secondary to blood loss (chronic): Secondary | ICD-10-CM

## 2023-07-13 DIAGNOSIS — D696 Thrombocytopenia, unspecified: Secondary | ICD-10-CM

## 2023-07-13 DIAGNOSIS — R051 Acute cough: Secondary | ICD-10-CM

## 2023-07-13 DIAGNOSIS — C8330 Diffuse large B-cell lymphoma, unspecified site: Secondary | ICD-10-CM

## 2023-07-13 DIAGNOSIS — C8332 Diffuse large B-cell lymphoma, intrathoracic lymph nodes: Secondary | ICD-10-CM

## 2023-07-13 DIAGNOSIS — C8339 Primary central nervous system lymphoma: Secondary | ICD-10-CM

## 2023-07-13 DIAGNOSIS — D6481 Anemia due to antineoplastic chemotherapy: Secondary | ICD-10-CM

## 2023-07-13 DIAGNOSIS — C8331 Diffuse large B-cell lymphoma, lymph nodes of head, face, and neck: Secondary | ICD-10-CM

## 2023-07-13 DIAGNOSIS — T451X5A Adverse effect of antineoplastic and immunosuppressive drugs, initial encounter: Secondary | ICD-10-CM

## 2023-07-13 DIAGNOSIS — C8510 Unspecified B-cell lymphoma, unspecified site: Secondary | ICD-10-CM

## 2023-07-13 DIAGNOSIS — C8333 Diffuse large B-cell lymphoma, intra-abdominal lymph nodes: Secondary | ICD-10-CM

## 2023-07-13 DIAGNOSIS — R0602 Shortness of breath: Secondary | ICD-10-CM

## 2023-07-13 DIAGNOSIS — N3000 Acute cystitis without hematuria: Secondary | ICD-10-CM

## 2023-07-13 DIAGNOSIS — C7931 Secondary malignant neoplasm of brain: Secondary | ICD-10-CM

## 2023-07-13 LAB — URINE CULTURE

## 2023-07-14 ENCOUNTER — Other Ambulatory Visit: Payer: Self-pay

## 2023-07-19 ENCOUNTER — Inpatient Hospital Stay

## 2023-07-19 ENCOUNTER — Inpatient Hospital Stay: Admitting: Hematology & Oncology

## 2023-07-19 ENCOUNTER — Other Ambulatory Visit: Payer: Self-pay

## 2023-07-19 VITALS — BP 117/52 | HR 80 | Temp 98.1°F | Resp 16 | Ht 67.0 in | Wt 157.0 lb

## 2023-07-19 VITALS — BP 108/47 | HR 73 | Temp 98.2°F | Resp 18

## 2023-07-19 DIAGNOSIS — Z79899 Other long term (current) drug therapy: Secondary | ICD-10-CM | POA: Diagnosis not present

## 2023-07-19 DIAGNOSIS — D509 Iron deficiency anemia, unspecified: Secondary | ICD-10-CM | POA: Diagnosis not present

## 2023-07-19 DIAGNOSIS — C8332 Diffuse large B-cell lymphoma, intrathoracic lymph nodes: Secondary | ICD-10-CM

## 2023-07-19 DIAGNOSIS — R059 Cough, unspecified: Secondary | ICD-10-CM | POA: Diagnosis not present

## 2023-07-19 DIAGNOSIS — C8510 Unspecified B-cell lymphoma, unspecified site: Secondary | ICD-10-CM

## 2023-07-19 DIAGNOSIS — Z5111 Encounter for antineoplastic chemotherapy: Secondary | ICD-10-CM | POA: Diagnosis not present

## 2023-07-19 DIAGNOSIS — C8330 Diffuse large B-cell lymphoma, unspecified site: Secondary | ICD-10-CM | POA: Diagnosis not present

## 2023-07-19 DIAGNOSIS — T451X5A Adverse effect of antineoplastic and immunosuppressive drugs, initial encounter: Secondary | ICD-10-CM | POA: Diagnosis not present

## 2023-07-19 DIAGNOSIS — D6481 Anemia due to antineoplastic chemotherapy: Secondary | ICD-10-CM | POA: Diagnosis not present

## 2023-07-19 LAB — CBC WITH DIFFERENTIAL (CANCER CENTER ONLY)
Abs Immature Granulocytes: 0.05 10*3/uL (ref 0.00–0.07)
Basophils Absolute: 0 10*3/uL (ref 0.0–0.1)
Basophils Relative: 0 %
Eosinophils Absolute: 0 10*3/uL (ref 0.0–0.5)
Eosinophils Relative: 0 %
HCT: 22.3 % — ABNORMAL LOW (ref 36.0–46.0)
Hemoglobin: 7.5 g/dL — ABNORMAL LOW (ref 12.0–15.0)
Immature Granulocytes: 1 %
Lymphocytes Relative: 28 %
Lymphs Abs: 1.2 10*3/uL (ref 0.7–4.0)
MCH: 31.8 pg (ref 26.0–34.0)
MCHC: 33.6 g/dL (ref 30.0–36.0)
MCV: 94.5 fL (ref 80.0–100.0)
Monocytes Absolute: 0.5 10*3/uL (ref 0.1–1.0)
Monocytes Relative: 12 %
Neutro Abs: 2.5 10*3/uL (ref 1.7–7.7)
Neutrophils Relative %: 59 %
Platelet Count: 104 10*3/uL — ABNORMAL LOW (ref 150–400)
RBC: 2.36 MIL/uL — ABNORMAL LOW (ref 3.87–5.11)
RDW: 13.3 % (ref 11.5–15.5)
WBC Count: 4.2 10*3/uL (ref 4.0–10.5)
nRBC: 0 % (ref 0.0–0.2)

## 2023-07-19 LAB — CMP (CANCER CENTER ONLY)
ALT: 13 U/L (ref 0–44)
AST: 15 U/L (ref 15–41)
Albumin: 4.6 g/dL (ref 3.5–5.0)
Alkaline Phosphatase: 79 U/L (ref 38–126)
Anion gap: 8 (ref 5–15)
BUN: 19 mg/dL (ref 8–23)
CO2: 29 mmol/L (ref 22–32)
Calcium: 10.3 mg/dL (ref 8.9–10.3)
Chloride: 99 mmol/L (ref 98–111)
Creatinine: 0.66 mg/dL (ref 0.44–1.00)
GFR, Estimated: >60 mL/min (ref >60)
Glucose, Bld: 118 mg/dL — ABNORMAL HIGH (ref 70–99)
Potassium: 4 mmol/L (ref 3.5–5.1)
Sodium: 136 mmol/L (ref 135–145)
Total Bilirubin: 0.4 mg/dL (ref 0.0–1.2)
Total Protein: 6.7 g/dL (ref 6.5–8.1)

## 2023-07-19 LAB — LACTATE DEHYDROGENASE: LDH: 226 U/L — ABNORMAL HIGH (ref 98–192)

## 2023-07-19 LAB — SAMPLE TO BLOOD BANK

## 2023-07-19 LAB — PREPARE RBC (CROSSMATCH)

## 2023-07-19 MED ORDER — SODIUM CHLORIDE 0.9 % IV SOLN: INTRAVENOUS | Status: DC

## 2023-07-19 MED ORDER — ACETAMINOPHEN 325 MG PO TABS: 650.0000 mg | ORAL_TABLET | Freq: Once | ORAL | Status: DC

## 2023-07-19 MED ORDER — SODIUM CHLORIDE 0.9% FLUSH
10.0000 mL | INTRAVENOUS | Status: DC | PRN
Start: 2023-07-19 — End: 2023-07-19
  Administered 2023-07-19: 10 mL

## 2023-07-19 MED ORDER — RITUXIMAB-PVVR CHEMO 500 MG/50ML IV SOLN
500.0000 mg/m2 | Freq: Once | INTRAVENOUS | Status: AC
  Administered 2023-07-19: 900 mg via INTRAVENOUS
  Filled 2023-07-19: qty 50

## 2023-07-19 MED ORDER — HEPARIN SOD (PORK) LOCK FLUSH 100 UNIT/ML IV SOLN
500.0000 [IU] | Freq: Once | INTRAVENOUS | Status: AC | PRN
Start: 2023-07-19 — End: 2023-07-19
  Administered 2023-07-19: 500 [IU]

## 2023-07-19 MED ORDER — DIPHENHYDRAMINE HCL 25 MG PO CAPS
50.0000 mg | ORAL_CAPSULE | Freq: Once | ORAL | Status: DC
Start: 2023-07-19 — End: 2023-07-19

## 2023-07-19 NOTE — Patient Instructions (Signed)
 [facility] - A DEPT OF [facility]  Discharge Instructions: Thank you for choosing [facility] to provide your oncology and hematology care.   If you have a lab appointment with the Cancer Center, please go directly to the Cancer Center and check in at the registration area.  Wear comfortable clothing and clothing appropriate for easy access to any Portacath or PICC line.   We strive to give you quality time with your provider. You may need to reschedule your appointment if you arrive late (15 or more minutes).  Arriving late affects you and other patients whose appointments are after yours.  Also, if you miss three or more appointments without notifying the office, you may be dismissed from the clinic at the provider's discretion.      For prescription refill requests, have your pharmacy contact our office and allow 72 hours for refills to be completed.    Today you received the following chemotherapy and/or immunotherapy agents Rituximab      To help prevent nausea and vomiting after your treatment, we encourage you to take your nausea medication as directed.  BELOW ARE SYMPTOMS THAT SHOULD BE REPORTED IMMEDIATELY: *FEVER GREATER THAN 100.4 F (38 C) OR HIGHER *CHILLS OR SWEATING *NAUSEA AND VOMITING THAT IS NOT CONTROLLED WITH YOUR NAUSEA MEDICATION *UNUSUAL SHORTNESS OF BREATH *UNUSUAL BRUISING OR BLEEDING *URINARY PROBLEMS (pain or burning when urinating, or frequent urination) *BOWEL PROBLEMS (unusual diarrhea, constipation, pain near the anus) TENDERNESS IN MOUTH AND THROAT WITH OR WITHOUT PRESENCE OF ULCERS (sore throat, sores in mouth, or a toothache) UNUSUAL RASH, SWELLING OR PAIN  UNUSUAL VAGINAL DISCHARGE OR ITCHING   Items with * indicate a potential emergency and should be followed up as soon as possible or go to the Emergency Department if any problems should occur.  Please show the CHEMOTHERAPY ALERT CARD or IMMUNOTHERAPY  ALERT CARD at check-in to the Emergency Department and triage nurse. Should you have questions after your visit or need to cancel or reschedule your appointment, please contact [facility] - A DEPT OF [facility]  [phone] and follow the prompts.  Office hours are 8:00 a.m. to 4:30 p.m. Monday - Friday. Please note that voicemails left after 4:00 p.m. may not be returned until the following business day.  We are closed weekends and major holidays. You have access to a nurse at all times for urgent questions. Please call the main number to the clinic [phone] and follow the prompts.  For any non-urgent questions, you may also contact your provider using MyChart. We now offer e-Visits for anyone 20 and older to request care online for non-urgent symptoms. For details visit [email or web address].   Also download the MyChart app! Go to the app store, search "MyChart", open the app, select [place], and log in with your MyChart username and password.

## 2023-07-19 NOTE — Patient Instructions (Signed)

## 2023-07-19 NOTE — Progress Notes (Signed)
 Hematology and Oncology Follow Up Visit  Ariel [name] [id number] 22-Jun-1955 68 y.o. 07/19/2023   Principle Diagnosis:  Diffuse large cell non-Hodgkin's lymphoma-CNS involvement -- relapsed --    Past Therapy: Status post chemotherapy with R-ICE/R-MTV --  s/p cycle #4 MATRIX -- s/p cycle #4 -- start on 05/02/2021 --completed on 08/06/2021 Neulasta  6 mg subcu post chemotherapy MTV- s/p cycle #1 -- given on 01/22/204   Current Therapy:        Imbruvica /Rituxan  -- started 05/02/2022, s/p cycle #5 -DC on 07/16/2022 --DC on 10/29/2022 High-dose methotrexate /Rituxan -cycle #6  -- started on 11/03/2022 - completed on 04/13/2023 Rituxan /Temodar  -status post 2 cycles   Interim History:  Ariel [name] is here today for follow up.  She close seems to be doing a lot better now.  She is having some trouble with the Temodar .  I told her that she can only have to take 200 mg daily of Temodar  for 5 days.  Hopefully, this will continue to help and she will have less toxicity.  She has not yet seen the doctors out at [facility] for the possibility of CAR-T therapy.  I know I did talk to one of the lymphoma specialist.  There has been no problem with headache.  She does feel a bit tired.  Surprising, her hemoglobin is 7.5.  She says there is no melena or bright red blood per rectum.  I did do a rectal exam on her.  The stool was brown but heme negative.  We will have to go ahead and give her I think 2 units of blood.  I do not want to see her hemoglobin this low for the Holiday weekend.  She has had no rashes.  There is been no cough or shortness of breath.  She has had no fever.  Overall, I would say that her performance status is probably ECOG 1.   Wt Readings from Last 3 Encounters:  07/19/23 157 lb (71.2 kg)  07/12/23 156 lb (70.8 kg)  06/11/23 158 lb (71.7 kg)     Medications:  Allergies as of 07/19/2023       Reactions   Decadron  [dexamethasone ] Other (See Comments)   Leg weakness Hallucinations    Plavix  [clopidogrel ] Hives, Swelling, Other (See Comments)   Lips became swollen   Prednisone Other (See Comments)   Steroids caused psychological issues  Hallucinations   Sulfa  Antibiotics Itching, Rash   Codeine Nausea And Vomiting        Medication List        Accurate as of Jul 19, 2023  9:59 AM. If you have any questions, ask your nurse or doctor.          acetaminophen  500 MG tablet Commonly known as: TYLENOL  Take 1,000 mg by mouth every 6 (six) hours as needed for headache, fever, moderate pain (pain score 4-6) or mild pain (pain score 1-3).   albuterol  108 (90 Base) MCG/ACT inhaler Commonly known as: VENTOLIN  HFA Inhale 2 puffs into the lungs every 6 (six) hours as needed for wheezing or shortness of breath.   doxycycline  100 MG tablet Commonly known as: VIBRA -TABS Take 1 tablet (100 mg total) by mouth 2 (two) times daily for 7 days.   lidocaine -prilocaine  cream Commonly known as: EMLA  Apply to affected area once   ondansetron  8 MG disintegrating tablet Commonly known as: ZOFRAN -ODT Take 1 tablet (8 mg total) by mouth every 8 (eight) hours as needed for nausea or vomiting.   prochlorperazine  10 MG tablet Commonly known  as: COMPAZINE  Take 1 tablet (10 mg total) by mouth every 6 (six) hours as needed for nausea or vomiting.   temozolomide  100 MG capsule Commonly known as: Temodar  Take 3 capsules (300 mg total) by mouth daily. Take for 5 days on, 23 days off. Repeat every 28 days. May take on an empty stomach or at bedtime to decrease nausea & vomiting.        Allergies:  Allergies  Allergen Reactions   Decadron  [Dexamethasone ] Other (See Comments)    Leg weakness Hallucinations   Plavix  [Clopidogrel ] Hives, Swelling and Other (See Comments)    Lips became swollen   Prednisone Other (See Comments)    Steroids caused psychological issues  Hallucinations   Sulfa  Antibiotics Itching and Rash   Codeine Nausea And Vomiting    Past Medical History,  Surgical history, Social history, and Family History were reviewed and updated.  Review of Systems: Review of Systems  Constitutional: Negative.   HENT: Negative.    Eyes: Negative.   Respiratory: Negative.    Cardiovascular: Negative.   Gastrointestinal: Negative.   Genitourinary: Negative.   Musculoskeletal: Negative.   Skin: Negative.   Neurological: Negative.   Endo/Heme/Allergies: Negative.   Psychiatric/Behavioral: Negative.       Physical Exam: Vitals:   07/19/23 0900  BP: (!) 117/52  Pulse: 80  Resp: 16  Temp: 98.1 F (36.7 C)  SpO2: 99%     Wt Readings from Last 3 Encounters:  07/19/23 157 lb (71.2 kg)  07/12/23 156 lb (70.8 kg)  06/11/23 158 lb (71.7 kg)    Physical Activity: Sufficiently Active (09/05/2022)   Exercise Vital Sign    Days of Exercise per Week: 6 days    Minutes of Exercise per Session: 40 min   Physical Exam Vitals reviewed.  HENT:     Head: Normocephalic and atraumatic.  Eyes:     Pupils: Pupils are equal, round, and reactive to light.  Cardiovascular:     Rate and Rhythm: Normal rate and regular rhythm.     Heart sounds: Normal heart sounds.  Pulmonary:     Effort: Pulmonary effort is normal.     Breath sounds: Normal breath sounds.  Abdominal:     General: Bowel sounds are normal.     Palpations: Abdomen is soft.  Musculoskeletal:        General: No tenderness or deformity. Normal range of motion.     Cervical back: Normal range of motion.  Lymphadenopathy:     Cervical: No cervical adenopathy.  Skin:    General: Skin is warm and dry.     Findings: No erythema or rash.  Neurological:     Mental Status: She is alert and oriented to person, place, and time.  Psychiatric:        Behavior: Behavior normal.        Thought Content: Thought content normal.        Judgment: Judgment normal.     Lab Results  Component Value Date   WBC 4.2 07/19/2023   HGB 7.5 (L) 07/19/2023   HCT 22.3 (L) 07/19/2023   MCV 94.5 07/19/2023    PLT 104 (L) 07/19/2023   Lab Results  Component Value Date   FERRITIN 851 (H) 06/11/2023   IRON 75 06/11/2023   TIBC 300 06/11/2023   UIBC 225 06/11/2023   IRONPCTSAT 25 06/11/2023   Lab Results  Component Value Date   RETICCTPCT 2.4 11/15/2021   RBC 2.36 (L) 07/19/2023   No  results found for: "KPAFRELGTCHN", "LAMBDASER", "KAPLAMBRATIO" No results found for: "IGGSERUM", "IGA", "IGMSERUM" No results found for: "TOTALPROTELP", "ALBUMINELP", "A1GS", "A2GS", "BETS", "BETA2SER", "GAMS", "MSPIKE", "SPEI"   Chemistry      Component Value Date/Time   NA 136 07/19/2023 0920   K 4.0 07/19/2023 0920   CL 99 07/19/2023 0920   CO2 29 07/19/2023 0920   BUN 19 07/19/2023 0920   CREATININE 0.66 07/19/2023 0920   CREATININE 0.68 11/18/2012 1439      Component Value Date/Time   CALCIUM  10.3 07/19/2023 0920   ALKPHOS 79 07/19/2023 0920   AST 15 07/19/2023 0920   ALT 13 07/19/2023 0920   BILITOT 0.4 07/19/2023 0920       Impression and Plan: Ms. Boys is a very pleasant 68 yo caucasian female with iron deficiency anemia and then was diagnosed with diffuse large cell non-Hodgkin's lymphoma that had traveled to her brain.  She completed inpatient aggressive therapy with Rituxan /high-dose methotrexate .  She unfortunately has had another relapse.now have her on Rituxan /Temodar .  We will have to do another MRI of the brain after this cycle.  Again, I will have to talk to the doctor that [facility] to see if she would be a candidate for CAR-T therapy.  I really think this would be a reasonable option for her.  I know that she has been somewhat resistant towards this.  However, our options are clearly are limited and I know that this lymphoma will certainly relapse again.  At least, her quality life is doing quite well right now.  I am happy that she is feeling fairly well.  I hate that she has this anemia.  We will have to have her come in tomorrow for a transfusion.  Again, I want to make sure  that we get her blood a little bit better before the Holiday weekend.   We will plan to get her back in about 3-4 weeks.      Ivor Mars, MD 5/22/20259:59 AM

## 2023-07-20 ENCOUNTER — Other Ambulatory Visit (HOSPITAL_COMMUNITY): Payer: Self-pay

## 2023-07-20 ENCOUNTER — Inpatient Hospital Stay

## 2023-07-20 ENCOUNTER — Other Ambulatory Visit: Payer: Self-pay

## 2023-07-20 ENCOUNTER — Encounter: Payer: Self-pay | Admitting: Hematology & Oncology

## 2023-07-20 ENCOUNTER — Other Ambulatory Visit (HOSPITAL_COMMUNITY): Payer: Self-pay | Admitting: Pharmacy Technician

## 2023-07-20 ENCOUNTER — Other Ambulatory Visit: Payer: Self-pay | Admitting: *Deleted

## 2023-07-20 DIAGNOSIS — Z5111 Encounter for antineoplastic chemotherapy: Secondary | ICD-10-CM | POA: Diagnosis not present

## 2023-07-20 DIAGNOSIS — D6481 Anemia due to antineoplastic chemotherapy: Secondary | ICD-10-CM | POA: Diagnosis not present

## 2023-07-20 DIAGNOSIS — R059 Cough, unspecified: Secondary | ICD-10-CM | POA: Diagnosis not present

## 2023-07-20 DIAGNOSIS — C8332 Diffuse large B-cell lymphoma, intrathoracic lymph nodes: Secondary | ICD-10-CM

## 2023-07-20 DIAGNOSIS — T451X5A Adverse effect of antineoplastic and immunosuppressive drugs, initial encounter: Secondary | ICD-10-CM | POA: Diagnosis not present

## 2023-07-20 DIAGNOSIS — Z79899 Other long term (current) drug therapy: Secondary | ICD-10-CM | POA: Diagnosis not present

## 2023-07-20 DIAGNOSIS — C8339 Primary central nervous system lymphoma: Secondary | ICD-10-CM

## 2023-07-20 DIAGNOSIS — D509 Iron deficiency anemia, unspecified: Secondary | ICD-10-CM | POA: Diagnosis not present

## 2023-07-20 DIAGNOSIS — C8330 Diffuse large B-cell lymphoma, unspecified site: Secondary | ICD-10-CM | POA: Diagnosis not present

## 2023-07-20 MED ORDER — SODIUM CHLORIDE 0.9% FLUSH
3.0000 mL | INTRAVENOUS | Status: AC | PRN
  Administered 2023-07-20: 3 mL

## 2023-07-20 MED ORDER — TEMOZOLOMIDE 100 MG PO CAPS
100.0000 mg/m2/d | ORAL_CAPSULE | Freq: Every day | ORAL | 5 refills | Status: DC
Start: 2023-07-20 — End: 2024-01-15
  Filled 2023-07-20: qty 15, 7d supply, fill #0
  Filled 2023-07-20: qty 10, 28d supply, fill #0
  Filled 2023-07-20: qty 15, 7d supply, fill #0
  Filled 2023-08-13 – 2023-08-15 (×2): qty 10, 28d supply, fill #1

## 2023-07-20 MED ORDER — ACETAMINOPHEN 325 MG PO TABS
650.0000 mg | ORAL_TABLET | Freq: Once | ORAL | Status: DC
Start: 2023-07-20 — End: 2023-07-20

## 2023-07-20 MED ORDER — HEPARIN SOD (PORK) LOCK FLUSH 100 UNIT/ML IV SOLN: 500.0000 [IU] | Freq: Every day | INTRAVENOUS | Status: DC | PRN

## 2023-07-20 MED ORDER — DIPHENHYDRAMINE HCL 25 MG PO CAPS: 25.0000 mg | ORAL_CAPSULE | Freq: Once | ORAL | Status: DC

## 2023-07-20 MED ORDER — HEPARIN SOD (PORK) LOCK FLUSH 100 UNIT/ML IV SOLN
250.0000 [IU] | INTRAVENOUS | Status: AC | PRN
  Administered 2023-07-20: 250 [IU]

## 2023-07-20 MED ORDER — SODIUM CHLORIDE 0.9% IV SOLUTION
250.0000 mL | INTRAVENOUS | Status: DC
Start: 2023-07-20 — End: 2023-07-20
  Administered 2023-07-20: 250 mL via INTRAVENOUS

## 2023-07-20 NOTE — Progress Notes (Signed)
 Specialty Pharmacy Ongoing Clinical Assessment Note  Ariel [name] is a 68 y.o. female who is being followed by the specialty pharmacy service for RxSp Oncology   Patient's specialty medication(s) reviewed today: Temozolomide  (TEMODAR )   Missed doses in the last 4 weeks: 0   Patient/Caregiver did not have any additional questions or concerns.   Therapeutic benefit summary: Unable to assess   Adverse events/side effects summary: Experienced adverse events/side effects (Pt reports nausea and vomitting, as well as fatigue.  Dr. Ennever has lowered her dose to help with these adverse events.  She has been using Zofran  PRN, I advised her to pretreat with Zofran  an hour prior to her dose to decrease the nausea/vommiting.)   Patient's therapy is appropriate to: Continue    Goals Addressed             This Visit's Progress    Slow Disease Progression   No change    Patient is initiating therapy. Patient will maintain adherence.         Follow up: 3 months  Malachi Screws Specialty Pharmacist

## 2023-07-20 NOTE — Progress Notes (Signed)
 Specialty Pharmacy Refill Coordination Note  Ariel [name] is a 68 y.o. female contacted today regarding refills of specialty medication(s) Temozolomide  (TEMODAR )   Patient requested [facility] at [facility] at [place] date: 07/21/23   Medication will be filled on 07/20/23.

## 2023-07-20 NOTE — Progress Notes (Signed)
 New script for Temodar  was received, reactivated patient.  Left voicemail for patient to coordinate refill.

## 2023-07-21 ENCOUNTER — Other Ambulatory Visit (HOSPITAL_COMMUNITY): Payer: Self-pay

## 2023-07-23 LAB — BPAM RBC
Blood Product Expiration Date: 202506192359
Blood Product Expiration Date: 202506192359
ISSUE DATE / TIME: 202505230902
ISSUE DATE / TIME: 202505230902
Unit Type and Rh: 6200
Unit Type and Rh: 6200

## 2023-07-23 LAB — TYPE AND SCREEN
ABO/RH(D): A POS
Antibody Screen: NEGATIVE
Unit division: 0
Unit division: 0

## 2023-07-26 ENCOUNTER — Inpatient Hospital Stay

## 2023-07-26 ENCOUNTER — Telehealth: Payer: Self-pay

## 2023-07-26 ENCOUNTER — Inpatient Hospital Stay: Admitting: Hematology & Oncology

## 2023-07-26 NOTE — Telephone Encounter (Signed)
 Referral faxed to [facility] at [phone].

## 2023-07-27 ENCOUNTER — Encounter: Payer: Self-pay | Admitting: Hematology & Oncology

## 2023-08-01 ENCOUNTER — Encounter: Payer: Self-pay | Admitting: Hematology & Oncology

## 2023-08-01 DIAGNOSIS — E559 Vitamin D deficiency, unspecified: Secondary | ICD-10-CM | POA: Diagnosis not present

## 2023-08-01 DIAGNOSIS — Z5181 Encounter for therapeutic drug level monitoring: Secondary | ICD-10-CM | POA: Diagnosis not present

## 2023-08-01 DIAGNOSIS — C8339 Primary central nervous system lymphoma: Secondary | ICD-10-CM | POA: Diagnosis not present

## 2023-08-01 DIAGNOSIS — Z7289 Other problems related to lifestyle: Secondary | ICD-10-CM | POA: Diagnosis not present

## 2023-08-02 ENCOUNTER — Inpatient Hospital Stay

## 2023-08-02 ENCOUNTER — Inpatient Hospital Stay: Attending: Internal Medicine | Admitting: Hematology & Oncology

## 2023-08-02 ENCOUNTER — Inpatient Hospital Stay: Attending: Internal Medicine

## 2023-08-02 ENCOUNTER — Encounter: Payer: Self-pay | Admitting: Hematology & Oncology

## 2023-08-02 VITALS — BP 118/69 | HR 72 | Temp 98.2°F | Resp 20 | Ht 67.0 in | Wt 157.0 lb

## 2023-08-02 DIAGNOSIS — Z9221 Personal history of antineoplastic chemotherapy: Secondary | ICD-10-CM | POA: Insufficient documentation

## 2023-08-02 DIAGNOSIS — C8510 Unspecified B-cell lymphoma, unspecified site: Secondary | ICD-10-CM

## 2023-08-02 DIAGNOSIS — D509 Iron deficiency anemia, unspecified: Secondary | ICD-10-CM | POA: Insufficient documentation

## 2023-08-02 DIAGNOSIS — C4359 Malignant melanoma of other part of trunk: Secondary | ICD-10-CM | POA: Diagnosis not present

## 2023-08-02 DIAGNOSIS — C8330 Diffuse large B-cell lymphoma, unspecified site: Secondary | ICD-10-CM | POA: Diagnosis not present

## 2023-08-02 MED ORDER — CIPROFLOXACIN HCL 500 MG PO TABS: 500.0000 mg | ORAL_TABLET | Freq: Every day | ORAL | 3 refills | Status: DC

## 2023-08-02 NOTE — Progress Notes (Signed)
 Hematology and Oncology Follow Up Visit  Ariel [name] [id number] 05-Apr-1955 68 y.o. 08/02/2023   Principle Diagnosis:  Diffuse large cell non-Hodgkin's lymphoma-CNS involvement -- relapsed --    Past Therapy: Status post chemotherapy with R-ICE/R-MTV --  s/p cycle #4 MATRIX -- s/p cycle #4 -- start on 05/02/2021 --completed on 08/06/2021 Neulasta  6 mg subcu post chemotherapy MTV- s/p cycle #1 -- given on 01/22/204   Current Therapy:        Imbruvica /Rituxan  -- started 05/02/2022, s/p cycle #5 -DC on 07/16/2022 --DC on 10/29/2022 High-dose methotrexate /Rituxan -cycle #6  -- started on 11/03/2022 - completed on 04/13/2023 Rituxan /Temodar  -status post 3 cycles   Interim History:  Ariel [name] is here today for follow up.  She actually was seen at [facility] yesterday.  I am so thankful that she was able to be seen by the lymphoma specialist at [facility].  I do feel that she would be a candidate for CAR-T therapy..  She is due to have a MRI of the brain on Monday.  We will have to see what the MRI shows.  The doctors at [facility] also recommended that she possibly have a brain biopsy to see exactly what is going on.  I would have to think that they probably would be looking at tissue for molecular studies.  She sounds and looks great.  She really feels good.  She has had no headache.  There is been no visual changes.  She has had no increasing fatigue or weakness.  She had a good appetite.  There has been no change in bowel or bladder habits.  She has had no bleeding.  There has been no rashes.  Her hips are doing okay.  She has had bilateral hip replacement.  She has had no leg swelling.  She does have little bit of neuropathy in the feet.  [facility]  She really enjoys this.  Overall, I would have to say that her performance status is probably ECOG 1.  .   Wt Readings from Last 3 Encounters:  08/02/23 157 lb 0.6 oz (71.2 kg)  07/19/23 157 lb (71.2 kg)  07/12/23 156 lb  (70.8 kg)     Medications:  Allergies as of 08/02/2023       Reactions   Decadron  [dexamethasone ] Other (See Comments)   Leg weakness Hallucinations   Plavix  [clopidogrel ] Hives, Swelling, Other (See Comments)   Lips became swollen   Prednisone Other (See Comments)   Steroids caused psychological issues  Hallucinations   Sulfa  Antibiotics Itching, Rash   Codeine Nausea And Vomiting        Medication List        Accurate as of August 02, 2023 12:17 PM. If you have any questions, ask your nurse or doctor.          acetaminophen  500 MG tablet Commonly known as: TYLENOL  Take 1,000 mg by mouth every 6 (six) hours as needed for headache, fever, moderate pain (pain score 4-6) or mild pain (pain score 1-3).   acyclovir  400 MG tablet Commonly known as: ZOVIRAX  Take 400 mg by mouth 2 (two) times daily.   albuterol  108 (90 Base) MCG/ACT inhaler Commonly known as: VENTOLIN  HFA Inhale 2 puffs into the lungs every 6 (six) hours as needed for wheezing or shortness of breath.   lidocaine -prilocaine  cream Commonly known as: EMLA  Apply to affected area once   ondansetron  8 MG disintegrating tablet Commonly known as: ZOFRAN -ODT Take 1 tablet (8  mg total) by mouth every 8 (eight) hours as needed for nausea or vomiting.   prochlorperazine  10 MG tablet Commonly known as: COMPAZINE  Take 1 tablet (10 mg total) by mouth every 6 (six) hours as needed for nausea or vomiting.   sulfamethoxazole -trimethoprim  800-160 MG tablet Commonly known as: BACTRIM  DS Take 1 tablet by mouth 3 (three) times a week.   temozolomide  100 MG capsule Commonly known as: Temodar  Take 2 capsules (200 mg total) by mouth daily. Take for 5 days on, 23 days off. Repeat every 28 days. May take on an empty stomach or at bedtime to decrease nausea & vomiting.        Allergies:  Allergies  Allergen Reactions   Decadron  [Dexamethasone ] Other (See Comments)    Leg weakness Hallucinations   Plavix   [Clopidogrel ] Hives, Swelling and Other (See Comments)    Lips became swollen   Prednisone Other (See Comments)    Steroids caused psychological issues  Hallucinations   Sulfa  Antibiotics Itching and Rash   Codeine Nausea And Vomiting    Past Medical History, Surgical history, Social history, and Family History were reviewed and updated.  Review of Systems: Review of Systems  Constitutional: Negative.   HENT: Negative.    Eyes: Negative.   Respiratory: Negative.    Cardiovascular: Negative.   Gastrointestinal: Negative.   Genitourinary: Negative.   Musculoskeletal: Negative.   Skin: Negative.   Neurological: Negative.   Endo/Heme/Allergies: Negative.   Psychiatric/Behavioral: Negative.       Physical Exam: Vitals:   08/02/23 1010  BP: 118/69  Pulse: 72  Resp: 20  Temp: 98.2 F (36.8 C)  SpO2: 100%     Wt Readings from Last 3 Encounters:  08/02/23 157 lb 0.6 oz (71.2 kg)  07/19/23 157 lb (71.2 kg)  07/12/23 156 lb (70.8 kg)    Physical Activity: Sufficiently Active (09/05/2022)   Exercise Vital Sign    Days of Exercise per Week: 6 days    Minutes of Exercise per Session: 40 min   Physical Exam Vitals reviewed.  HENT:     Head: Normocephalic and atraumatic.  Eyes:     Pupils: Pupils are equal, round, and reactive to light.  Cardiovascular:     Rate and Rhythm: Normal rate and regular rhythm.     Heart sounds: Normal heart sounds.  Pulmonary:     Effort: Pulmonary effort is normal.     Breath sounds: Normal breath sounds.  Abdominal:     General: Bowel sounds are normal.     Palpations: Abdomen is soft.  Musculoskeletal:        General: No tenderness or deformity. Normal range of motion.     Cervical back: Normal range of motion.  Lymphadenopathy:     Cervical: No cervical adenopathy.  Skin:    General: Skin is warm and dry.     Findings: No erythema or rash.  Neurological:     Mental Status: She is alert and oriented to person, place, and time.   Psychiatric:        Behavior: Behavior normal.        Thought Content: Thought content normal.        Judgment: Judgment normal.      Lab Results  Component Value Date   WBC 4.2 07/19/2023   HGB 7.5 (L) 07/19/2023   HCT 22.3 (L) 07/19/2023   MCV 94.5 07/19/2023   PLT 104 (L) 07/19/2023   Lab Results  Component Value Date   FERRITIN  851 (H) 06/11/2023   IRON 75 06/11/2023   TIBC 300 06/11/2023   UIBC 225 06/11/2023   IRONPCTSAT 25 06/11/2023   Lab Results  Component Value Date   RETICCTPCT 2.4 11/15/2021   RBC 2.36 (L) 07/19/2023   No results found for: "KPAFRELGTCHN", "LAMBDASER", "KAPLAMBRATIO" No results found for: "IGGSERUM", "IGA", "IGMSERUM" No results found for: "TOTALPROTELP", "ALBUMINELP", "A1GS", "A2GS", "BETS", "BETA2SER", "GAMS", "MSPIKE", "SPEI"   Chemistry      Component Value Date/Time   NA 136 07/19/2023 0920   K 4.0 07/19/2023 0920   CL 99 07/19/2023 0920   CO2 29 07/19/2023 0920   BUN 19 07/19/2023 0920   CREATININE 0.66 07/19/2023 0920   CREATININE 0.68 11/18/2012 1439      Component Value Date/Time   CALCIUM  10.3 07/19/2023 0920   ALKPHOS 79 07/19/2023 0920   AST 15 07/19/2023 0920   ALT 13 07/19/2023 0920   BILITOT 0.4 07/19/2023 0920       Impression and Plan: Ariel [name] is a very pleasant 68 yo caucasian female with iron deficiency anemia and then was diagnosed with diffuse large cell non-Hodgkin's lymphoma that had traveled to her brain.  She completed inpatient aggressive therapy with Rituxan /high-dose methotrexate .  She unfortunately has had another relapse.we treated her with  Rituxan /Temodar .  I believe that she had to respond to this protocol.  She really sounds quite good.  I do believe that this protocol helped and that we will see that she is in remission.  Again would be fantastic if she would qualify for CAR-T therapy.  I really think this is a super option for her.  Again, her quality of life is much better right now.  She  really looks great.  She feels good.  I know that her daughter and family will be help from [place]  for July 4 holiday.  I know she is looking forward to this.  I am so happy that the anemia has resolved.  I still but I am not sure as to why she had such a significant anemia, although I think that this may have been from the Temodar .  We will have to see what the MRI has to show.  Will plan to get her back to see me probably in about a month.  By then, she may have had or began treatment at [facility].  For right now, we will we will have to do another MRI of the brain after this cycle.    Ivor Mars, MD 6/5/202512:17 PM

## 2023-08-05 ENCOUNTER — Other Ambulatory Visit: Payer: Self-pay

## 2023-08-06 ENCOUNTER — Ambulatory Visit (HOSPITAL_COMMUNITY)
Admission: RE | Admit: 2023-08-06 | Discharge: 2023-08-06 | Disposition: A | Discharge status: Home / Self Care | Source: Ambulatory Visit | Attending: Hematology & Oncology | Admitting: Hematology & Oncology

## 2023-08-06 DIAGNOSIS — C8332 Diffuse large B-cell lymphoma, intrathoracic lymph nodes: Secondary | ICD-10-CM | POA: Insufficient documentation

## 2023-08-06 DIAGNOSIS — R9082 White matter disease, unspecified: Secondary | ICD-10-CM | POA: Diagnosis not present

## 2023-08-06 DIAGNOSIS — G9389 Other specified disorders of brain: Secondary | ICD-10-CM | POA: Diagnosis not present

## 2023-08-06 DIAGNOSIS — R9089 Other abnormal findings on diagnostic imaging of central nervous system: Secondary | ICD-10-CM | POA: Diagnosis not present

## 2023-08-06 MED ORDER — GADOBUTROL 1 MMOL/ML IV SOLN
7.0000 mL | Freq: Once | INTRAVENOUS | Status: AC | PRN
  Administered 2023-08-06: 7 mL via INTRAVENOUS

## 2023-08-08 ENCOUNTER — Ambulatory Visit: Payer: Self-pay | Admitting: Hematology & Oncology

## 2023-08-08 NOTE — Telephone Encounter (Signed)
-----   Message from Ivor Mars sent at 08/08/2023  1:26 PM EDT ----- Please call let him know that the MRI looks better.  Great job.  Please make sure that this result gets to [facility].  [name]

## 2023-08-08 NOTE — Telephone Encounter (Signed)
 Advised via MyChart.

## 2023-08-09 DIAGNOSIS — C8339 Primary central nervous system lymphoma: Secondary | ICD-10-CM | POA: Diagnosis not present

## 2023-08-13 ENCOUNTER — Other Ambulatory Visit: Payer: Self-pay

## 2023-08-15 ENCOUNTER — Other Ambulatory Visit: Payer: Self-pay

## 2023-08-15 ENCOUNTER — Other Ambulatory Visit: Payer: Self-pay | Admitting: Pharmacy Technician

## 2023-08-15 NOTE — Progress Notes (Signed)
 Oral Oncology Patient Advocate Encounter  Disenrolling patient from [place]. She is coordinating treatment for CAR-T at [facility]. Patient will be off therapy.  Alese Furniss (Patty) Benjaman Branch, CPhT  [facility], [place], Cristine Done, [place] Oral Chemotherapy Patient Advocate Phone: [phone]  Fax: [phone]

## 2023-08-16 ENCOUNTER — Other Ambulatory Visit

## 2023-08-16 ENCOUNTER — Ambulatory Visit: Admitting: Hematology & Oncology

## 2023-08-16 ENCOUNTER — Ambulatory Visit

## 2023-08-17 DIAGNOSIS — M1712 Unilateral primary osteoarthritis, left knee: Secondary | ICD-10-CM | POA: Diagnosis not present

## 2023-08-17 DIAGNOSIS — M170 Bilateral primary osteoarthritis of knee: Secondary | ICD-10-CM | POA: Diagnosis not present

## 2023-08-17 DIAGNOSIS — M1711 Unilateral primary osteoarthritis, right knee: Secondary | ICD-10-CM | POA: Diagnosis not present

## 2023-08-24 DIAGNOSIS — G9389 Other specified disorders of brain: Secondary | ICD-10-CM | POA: Diagnosis not present

## 2023-08-27 ENCOUNTER — Other Ambulatory Visit: Payer: Self-pay | Admitting: *Deleted

## 2023-08-27 DIAGNOSIS — C8510 Unspecified B-cell lymphoma, unspecified site: Secondary | ICD-10-CM

## 2023-08-28 ENCOUNTER — Telehealth: Payer: Self-pay

## 2023-08-28 ENCOUNTER — Other Ambulatory Visit: Payer: Self-pay | Admitting: *Deleted

## 2023-08-28 DIAGNOSIS — C8510 Unspecified B-cell lymphoma, unspecified site: Secondary | ICD-10-CM

## 2023-08-28 NOTE — Telephone Encounter (Signed)
 Referral faxed to [facility] for CAR-T therapy

## 2023-08-29 ENCOUNTER — Encounter: Payer: Self-pay | Admitting: Hematology & Oncology

## 2023-08-30 ENCOUNTER — Inpatient Hospital Stay

## 2023-08-30 ENCOUNTER — Encounter: Payer: Self-pay | Admitting: Family

## 2023-08-30 ENCOUNTER — Ambulatory Visit: Admitting: Hematology & Oncology

## 2023-08-30 ENCOUNTER — Other Ambulatory Visit

## 2023-09-04 DIAGNOSIS — C8330 Diffuse large B-cell lymphoma, unspecified site: Secondary | ICD-10-CM | POA: Diagnosis not present

## 2023-09-04 DIAGNOSIS — R590 Localized enlarged lymph nodes: Secondary | ICD-10-CM | POA: Diagnosis not present

## 2023-09-04 DIAGNOSIS — K689 Other disorders of retroperitoneum: Secondary | ICD-10-CM | POA: Diagnosis not present

## 2023-09-06 ENCOUNTER — Encounter: Payer: Self-pay | Admitting: Medical Oncology

## 2023-09-06 ENCOUNTER — Inpatient Hospital Stay: Admitting: Medical Oncology

## 2023-09-06 ENCOUNTER — Inpatient Hospital Stay: Attending: Internal Medicine

## 2023-09-06 ENCOUNTER — Inpatient Hospital Stay

## 2023-09-06 VITALS — BP 130/53 | HR 64 | Temp 98.1°F | Resp 16 | Ht 67.0 in | Wt 159.8 lb

## 2023-09-06 DIAGNOSIS — D509 Iron deficiency anemia, unspecified: Secondary | ICD-10-CM | POA: Insufficient documentation

## 2023-09-06 DIAGNOSIS — C8330 Diffuse large B-cell lymphoma, unspecified site: Secondary | ICD-10-CM

## 2023-09-06 DIAGNOSIS — C83398 Diffuse large b-cell lymphoma of other extranodal and solid organ sites: Secondary | ICD-10-CM | POA: Diagnosis not present

## 2023-09-06 DIAGNOSIS — C4359 Malignant melanoma of other part of trunk: Secondary | ICD-10-CM

## 2023-09-06 DIAGNOSIS — Z95828 Presence of other vascular implants and grafts: Secondary | ICD-10-CM

## 2023-09-06 DIAGNOSIS — Z9221 Personal history of antineoplastic chemotherapy: Secondary | ICD-10-CM | POA: Insufficient documentation

## 2023-09-06 DIAGNOSIS — C8510 Unspecified B-cell lymphoma, unspecified site: Secondary | ICD-10-CM

## 2023-09-06 DIAGNOSIS — C7931 Secondary malignant neoplasm of brain: Secondary | ICD-10-CM

## 2023-09-06 LAB — CBC WITH DIFFERENTIAL (CANCER CENTER ONLY)
Abs Immature Granulocytes: 0.04 K/uL (ref 0.00–0.07)
Basophils Absolute: 0 K/uL (ref 0.0–0.1)
Basophils Relative: 1 %
Eosinophils Absolute: 0.1 K/uL (ref 0.0–0.5)
Eosinophils Relative: 2 %
HCT: 40.3 % (ref 36.0–46.0)
Hemoglobin: 14.2 g/dL (ref 12.0–15.0)
Immature Granulocytes: 1 %
Lymphocytes Relative: 48 %
Lymphs Abs: 1.6 K/uL (ref 0.7–4.0)
MCH: 32 pg (ref 26.0–34.0)
MCHC: 35.2 g/dL (ref 30.0–36.0)
MCV: 90.8 fL (ref 80.0–100.0)
Monocytes Absolute: 0.6 K/uL (ref 0.1–1.0)
Monocytes Relative: 19 %
Neutro Abs: 1 K/uL — ABNORMAL LOW (ref 1.7–7.7)
Neutrophils Relative %: 29 %
Platelet Count: 172 K/uL (ref 150–400)
RBC: 4.44 MIL/uL (ref 3.87–5.11)
RDW: 13.4 % (ref 11.5–15.5)
WBC Count: 3.3 K/uL — ABNORMAL LOW (ref 4.0–10.5)
nRBC: 0 % (ref 0.0–0.2)

## 2023-09-06 LAB — CMP (CANCER CENTER ONLY)
ALT: 20 U/L (ref 0–44)
AST: 19 U/L (ref 15–41)
Albumin: 4.7 g/dL (ref 3.5–5.0)
Alkaline Phosphatase: 49 U/L (ref 38–126)
Anion gap: 9 (ref 5–15)
BUN: 16 mg/dL (ref 8–23)
CO2: 28 mmol/L (ref 22–32)
Calcium: 10.4 mg/dL — ABNORMAL HIGH (ref 8.9–10.3)
Chloride: 102 mmol/L (ref 98–111)
Creatinine: 0.71 mg/dL (ref 0.44–1.00)
GFR, Estimated: >60 mL/min (ref >60)
Glucose, Bld: 117 mg/dL — ABNORMAL HIGH (ref 70–99)
Potassium: 3.9 mmol/L (ref 3.5–5.1)
Sodium: 139 mmol/L (ref 135–145)
Total Bilirubin: 0.4 mg/dL (ref 0.0–1.2)
Total Protein: 6.7 g/dL (ref 6.5–8.1)

## 2023-09-06 LAB — IRON AND IRON BINDING CAPACITY (CC-WL,HP ONLY)
Iron: 107 ug/dL (ref 28–170)
Saturation Ratios: 38 % — ABNORMAL HIGH (ref 10.4–31.8)
TIBC: 283 ug/dL (ref 250–450)
UIBC: 176 ug/dL (ref 148–442)

## 2023-09-06 LAB — SAMPLE TO BLOOD BANK

## 2023-09-06 LAB — LACTATE DEHYDROGENASE: LDH: 180 U/L (ref 98–192)

## 2023-09-06 LAB — FERRITIN: Ferritin: 1826 ng/mL — ABNORMAL HIGH (ref 11–307)

## 2023-09-06 LAB — SAVE SMEAR(SSMR), FOR PROVIDER SLIDE REVIEW

## 2023-09-06 MED ORDER — SODIUM CHLORIDE 0.9% FLUSH
10.0000 mL | Freq: Once | INTRAVENOUS | Status: AC
  Administered 2023-09-06: 10 mL

## 2023-09-06 MED ORDER — HEPARIN SOD (PORK) LOCK FLUSH 100 UNIT/ML IV SOLN: 500.0000 [IU] | Freq: Once | INTRAVENOUS | Status: DC

## 2023-09-06 MED ORDER — SODIUM CHLORIDE 0.9% FLUSH: 10.0000 mL | INTRAVENOUS | Status: DC | PRN

## 2023-09-06 MED ORDER — HEPARIN SOD (PORK) LOCK FLUSH 100 UNIT/ML IV SOLN
500.0000 [IU] | Freq: Once | INTRAVENOUS | Status: AC
  Administered 2023-09-06: 500 [IU] via INTRAVENOUS

## 2023-09-06 NOTE — Progress Notes (Signed)
 Hematology and Oncology Follow Up Visit  Ariel [name] [id number] 01-Feb-1956 69 y.o. 09/06/2023   Principle Diagnosis:  Diffuse large cell non-Hodgkin's lymphoma-CNS involvement -- relapsed --    Past Therapy: Status post chemotherapy with R-ICE/R-MTV --  s/p cycle #4 MATRIX -- s/p cycle #4 -- start on 05/02/2021 --completed on 08/06/2021 Neulasta  6 mg subcu post chemotherapy MTV- s/p cycle #1 -- given on 01/22/204 Imbruvica /Rituxan  -- started 05/02/2022, s/p cycle #5 -DC on 07/16/2022 --DC on 10/29/2022 High-dose methotrexate /Rituxan -cycle #6  -- started on 11/03/2022 - completed on 04/13/2023   Current Therapy:        Rituxan /Temodar  -status post 3 cycles   Interim History:  Ariel [name] is here today for follow up.    She is followed by a lymphoma specialist at [facility]. The question remains if she would be a possible candidate for CAR-T therapy. Since her last visit she had a MR of her brain which showed some interval improvement of the vague areas of contrast enhancement and abnormal T2 signal present within the inferior frontal lobes. She has since had another MRI at [facility] showing possible progression.   Her last TX of the Rituxan  was on 08/16/2023. Her last blood transfusion was on 07/20/2023. She did stop taking her Temodar  and has stopped her Rituxan  per Dr. Kallie at [facility]. She has her second opinion at [facility] next week given some communication concerns discussed by patient.   She sounds and looks great.  She really feels good.  She has had no headache.  There is been no visual changes.  She has had no increasing fatigue or weakness.  She had a good appetite.  There has been no change in bowel or bladder habits.  She has had no bleeding.  There has been no rashes.    She has had no leg swelling.  She does have little bit of neuropathy in the feet.  Overall, I would have to say that her performance status is probably ECOG 1.  .   Wt Readings from Last 3 Encounters:  09/06/23 159 lb  12.8 oz (72.5 kg)  08/02/23 157 lb 0.6 oz (71.2 kg)  07/19/23 157 lb (71.2 kg)     Medications:  Allergies as of 09/06/2023       Reactions   Decadron  [dexamethasone ] Other (See Comments)   Leg weakness Hallucinations   Plavix  [clopidogrel ] Hives, Swelling, Other (See Comments)   Lips became swollen   Prednisone Other (See Comments)   Steroids caused psychological issues  Hallucinations   Sulfa  Antibiotics Itching, Rash   Codeine Nausea And Vomiting        Medication List        Accurate as of September 06, 2023 11:11 AM. If you have any questions, ask your nurse or doctor.          acetaminophen  500 MG tablet Commonly known as: TYLENOL  Take 1,000 mg by mouth every 6 (six) hours as needed for headache, fever, moderate pain (pain score 4-6) or mild pain (pain score 1-3).   acyclovir  400 MG tablet Commonly known as: ZOVIRAX  Take 400 mg by mouth 2 (two) times daily.   albuterol  108 (90 Base) MCG/ACT inhaler Commonly known as: VENTOLIN  HFA Inhale 2 puffs into the lungs every 6 (six) hours as needed for wheezing or shortness of breath.   ciprofloxacin  500 MG tablet Commonly known as: Cipro  Take 1 tablet (500 mg total) by mouth daily with breakfast.   lidocaine -prilocaine  cream Commonly known as: EMLA  Apply to affected  area once   ondansetron  8 MG disintegrating tablet Commonly known as: ZOFRAN -ODT Take 1 tablet (8 mg total) by mouth every 8 (eight) hours as needed for nausea or vomiting.   prochlorperazine  10 MG tablet Commonly known as: COMPAZINE  Take 1 tablet (10 mg total) by mouth every 6 (six) hours as needed for nausea or vomiting.   temozolomide  100 MG capsule Commonly known as: Temodar  Take 2 capsules (200 mg total) by mouth daily. Take for 5 days on, 23 days off. Repeat every 28 days. May take on an empty stomach or at bedtime to decrease nausea & vomiting.        Allergies:  Allergies  Allergen Reactions   Decadron  [Dexamethasone ] Other (See  Comments)    Leg weakness Hallucinations   Plavix  [Clopidogrel ] Hives, Swelling and Other (See Comments)    Lips became swollen   Prednisone Other (See Comments)    Steroids caused psychological issues  Hallucinations   Sulfa  Antibiotics Itching and Rash   Codeine Nausea And Vomiting    Past Medical History, Surgical history, Social history, and Family History were reviewed and updated.  Review of Systems: Review of Systems  Constitutional: Negative.   HENT: Negative.    Eyes: Negative.   Respiratory: Negative.    Cardiovascular: Negative.   Gastrointestinal: Negative.   Genitourinary: Negative.   Musculoskeletal: Negative.   Skin: Negative.   Neurological: Negative.   Endo/Heme/Allergies: Negative.   Psychiatric/Behavioral: Negative.       Physical Exam: Vitals:   09/06/23 1008  BP: (!) 130/53  Pulse: 64  Resp: 16  Temp: 98.1 F (36.7 C)  SpO2: 98%     Wt Readings from Last 3 Encounters:  09/06/23 159 lb 12.8 oz (72.5 kg)  08/02/23 157 lb 0.6 oz (71.2 kg)  07/19/23 157 lb (71.2 kg)    Physical Activity: Sufficiently Active (09/05/2022)   Exercise Vital Sign    Days of Exercise per Week: 6 days    Minutes of Exercise per Session: 40 min   Physical Exam Vitals reviewed.  HENT:     Head: Normocephalic and atraumatic.  Eyes:     Pupils: Pupils are equal, round, and reactive to light.  Cardiovascular:     Rate and Rhythm: Normal rate and regular rhythm.     Heart sounds: Normal heart sounds.  Pulmonary:     Effort: Pulmonary effort is normal.     Breath sounds: Normal breath sounds.  Abdominal:     General: Bowel sounds are normal.     Palpations: Abdomen is soft.  Musculoskeletal:        General: No tenderness or deformity. Normal range of motion.     Cervical back: Normal range of motion.  Lymphadenopathy:     Cervical: No cervical adenopathy.  Skin:    General: Skin is warm and dry.     Findings: No erythema or rash.  Neurological:     Mental  Status: She is alert and oriented to person, place, and time.  Psychiatric:        Behavior: Behavior normal.        Thought Content: Thought content normal.        Judgment: Judgment normal.      Lab Results  Component Value Date   WBC 3.3 (L) 09/06/2023   HGB 14.2 09/06/2023   HCT 40.3 09/06/2023   MCV 90.8 09/06/2023   PLT 172 09/06/2023   Lab Results  Component Value Date   FERRITIN 851 (H) 06/11/2023  IRON 75 06/11/2023   TIBC 300 06/11/2023   UIBC 225 06/11/2023   IRONPCTSAT 25 06/11/2023   Lab Results  Component Value Date   RETICCTPCT 2.4 11/15/2021   RBC 4.44 09/06/2023   No results found for: KPAFRELGTCHN, LAMBDASER, KAPLAMBRATIO No results found for: IGGSERUM, IGA, IGMSERUM No results found for: STEPHANY CARLOTA BENSON MARKEL EARLA JOANNIE DOC VICK, SPEI   Chemistry      Component Value Date/Time   NA 139 09/06/2023 0955   K 3.9 09/06/2023 0955   CL 102 09/06/2023 0955   CO2 28 09/06/2023 0955   BUN 16 09/06/2023 0955   CREATININE 0.71 09/06/2023 0955   CREATININE 0.68 11/18/2012 1439      Component Value Date/Time   CALCIUM  10.4 (H) 09/06/2023 0955   ALKPHOS 49 09/06/2023 0955   AST 19 09/06/2023 0955   ALT 20 09/06/2023 0955   BILITOT 0.4 09/06/2023 0955     Encounter Diagnosis  Name Primary?   Malignant neoplasm metastatic to brain [facility]) Yes    Impression and Plan: Ms. Ragan is a very pleasant 68 yo caucasian female with iron deficiency anemia and then was diagnosed with diffuse large cell non-Hodgkin's lymphoma with CNS involvement.   She completed inpatient aggressive therapy with Rituxan /high-dose methotrexate .  She unfortunately has had another relapse.we treated her with  Rituxan /Temodar .  She is now off of therapy and awaiting a consult next week at [facility] for potential CAR-T therapy. She reports feeling the best she has in a long time.   Today her CBC shows significant improvements in her Hgb  after being off of the Temodar . No blood products needed today.    Deaccess port No blood needed RTC 1 month MD only, port labs (CBC w/, CMP, LDH, save smear, sample to blood bank, iron , ferritin)   Lauraine CHRISTELLA Dais, PA-C [phone] AM

## 2023-09-08 ENCOUNTER — Other Ambulatory Visit: Payer: Self-pay

## 2023-09-09 ENCOUNTER — Other Ambulatory Visit: Payer: Self-pay

## 2023-09-13 ENCOUNTER — Ambulatory Visit: Admitting: Hematology & Oncology

## 2023-09-13 ENCOUNTER — Inpatient Hospital Stay

## 2023-09-13 ENCOUNTER — Ambulatory Visit

## 2023-09-13 ENCOUNTER — Other Ambulatory Visit

## 2023-09-14 DIAGNOSIS — C8330 Diffuse large B-cell lymphoma, unspecified site: Secondary | ICD-10-CM | POA: Diagnosis not present

## 2023-09-14 DIAGNOSIS — C8339 Primary central nervous system lymphoma: Secondary | ICD-10-CM | POA: Diagnosis not present

## 2023-09-14 DIAGNOSIS — C8338 Diffuse large B-cell lymphoma, lymph nodes of multiple sites: Secondary | ICD-10-CM | POA: Diagnosis not present

## 2023-09-17 ENCOUNTER — Encounter: Payer: Self-pay | Admitting: Hematology & Oncology

## 2023-09-17 ENCOUNTER — Other Ambulatory Visit: Payer: Self-pay | Admitting: *Deleted

## 2023-09-17 DIAGNOSIS — C8330 Diffuse large B-cell lymphoma, unspecified site: Secondary | ICD-10-CM

## 2023-09-17 DIAGNOSIS — C7931 Secondary malignant neoplasm of brain: Secondary | ICD-10-CM

## 2023-09-18 ENCOUNTER — Ambulatory Visit (HOSPITAL_BASED_OUTPATIENT_CLINIC_OR_DEPARTMENT_OTHER)
Admission: RE | Admit: 2023-09-18 | Discharge: 2023-09-18 | Disposition: A | Discharge status: Home / Self Care | Source: Ambulatory Visit | Attending: Hematology & Oncology | Admitting: Hematology & Oncology

## 2023-09-18 ENCOUNTER — Ambulatory Visit: Payer: Self-pay | Admitting: Hematology & Oncology

## 2023-09-18 ENCOUNTER — Other Ambulatory Visit: Payer: Self-pay

## 2023-09-18 DIAGNOSIS — Z9221 Personal history of antineoplastic chemotherapy: Secondary | ICD-10-CM | POA: Insufficient documentation

## 2023-09-18 DIAGNOSIS — E785 Hyperlipidemia, unspecified: Secondary | ICD-10-CM | POA: Diagnosis not present

## 2023-09-18 DIAGNOSIS — C7931 Secondary malignant neoplasm of brain: Secondary | ICD-10-CM | POA: Diagnosis not present

## 2023-09-18 DIAGNOSIS — C8330 Diffuse large B-cell lymphoma, unspecified site: Secondary | ICD-10-CM | POA: Diagnosis not present

## 2023-09-18 DIAGNOSIS — Z0189 Encounter for other specified special examinations: Secondary | ICD-10-CM

## 2023-09-18 DIAGNOSIS — C8339 Primary central nervous system lymphoma: Secondary | ICD-10-CM | POA: Diagnosis not present

## 2023-09-18 DIAGNOSIS — Z8673 Personal history of transient ischemic attack (TIA), and cerebral infarction without residual deficits: Secondary | ICD-10-CM | POA: Insufficient documentation

## 2023-09-18 LAB — ECHOCARDIOGRAM COMPLETE
AR max vel: 1.85 cm2
AV Area VTI: 1.8 cm2
AV Area mean vel: 1.97 cm2
AV Mean grad: 4 mmHg
AV Peak grad: 7.3 mmHg
Ao pk vel: 1.35 m/s
Area-P 1/2: 3.74 cm2
Calc EF: 66.4 %
S' Lateral: 2.8 cm
Single Plane A2C EF: 67.9 %
Single Plane A4C EF: 60.7 %

## 2023-09-20 DIAGNOSIS — C8330 Diffuse large B-cell lymphoma, unspecified site: Secondary | ICD-10-CM | POA: Diagnosis not present

## 2023-09-26 DIAGNOSIS — C8339 Primary central nervous system lymphoma: Secondary | ICD-10-CM | POA: Diagnosis not present

## 2023-10-01 NOTE — Progress Notes (Signed)
 She has had chemotherapy.  She might be eligible for CAR-T therapy.  We are assessing her cardiac function to make sure that she could handle CAR-T therapy.  Ariel [name]

## 2023-10-08 ENCOUNTER — Other Ambulatory Visit

## 2023-10-08 ENCOUNTER — Ambulatory Visit: Admitting: Hematology & Oncology

## 2023-10-08 ENCOUNTER — Inpatient Hospital Stay

## 2023-10-11 DIAGNOSIS — R9089 Other abnormal findings on diagnostic imaging of central nervous system: Secondary | ICD-10-CM | POA: Diagnosis not present

## 2023-10-11 DIAGNOSIS — C8339 Primary central nervous system lymphoma: Secondary | ICD-10-CM | POA: Diagnosis not present

## 2023-10-11 DIAGNOSIS — C8338 Diffuse large B-cell lymphoma, lymph nodes of multiple sites: Secondary | ICD-10-CM | POA: Diagnosis not present

## 2023-10-11 DIAGNOSIS — I63512 Cerebral infarction due to unspecified occlusion or stenosis of left middle cerebral artery: Secondary | ICD-10-CM | POA: Diagnosis not present

## 2023-10-12 DIAGNOSIS — Z882 Allergy status to sulfonamides status: Secondary | ICD-10-CM | POA: Diagnosis not present

## 2023-10-12 DIAGNOSIS — Z52011 Autologous donor, stem cells: Secondary | ICD-10-CM | POA: Diagnosis not present

## 2023-10-12 DIAGNOSIS — Z888 Allergy status to other drugs, medicaments and biological substances status: Secondary | ICD-10-CM | POA: Diagnosis not present

## 2023-10-12 DIAGNOSIS — Z452 Encounter for adjustment and management of vascular access device: Secondary | ICD-10-CM | POA: Diagnosis not present

## 2023-10-12 DIAGNOSIS — C8338 Diffuse large B-cell lymphoma, lymph nodes of multiple sites: Secondary | ICD-10-CM | POA: Diagnosis not present

## 2023-10-12 DIAGNOSIS — M359 Systemic involvement of connective tissue, unspecified: Secondary | ICD-10-CM | POA: Diagnosis not present

## 2023-10-12 DIAGNOSIS — Z52001 Unspecified donor, stem cells: Secondary | ICD-10-CM | POA: Diagnosis not present

## 2023-10-12 DIAGNOSIS — C8510 Unspecified B-cell lymphoma, unspecified site: Secondary | ICD-10-CM | POA: Diagnosis not present

## 2023-10-12 DIAGNOSIS — Z885 Allergy status to narcotic agent status: Secondary | ICD-10-CM | POA: Diagnosis not present

## 2023-10-12 DIAGNOSIS — C8339 Primary central nervous system lymphoma: Secondary | ICD-10-CM | POA: Diagnosis not present

## 2023-10-19 ENCOUNTER — Other Ambulatory Visit: Payer: Self-pay

## 2023-10-25 DIAGNOSIS — H59811 Chorioretinal scars after surgery for detachment, right eye: Secondary | ICD-10-CM | POA: Diagnosis not present

## 2023-10-25 DIAGNOSIS — H30111 Disseminated chorioretinal inflammation of posterior pole, right eye: Secondary | ICD-10-CM | POA: Diagnosis not present

## 2023-10-25 DIAGNOSIS — H35373 Puckering of macula, bilateral: Secondary | ICD-10-CM | POA: Diagnosis not present

## 2023-10-25 DIAGNOSIS — H35363 Drusen (degenerative) of macula, bilateral: Secondary | ICD-10-CM | POA: Diagnosis not present

## 2023-10-25 DIAGNOSIS — H353112 Nonexudative age-related macular degeneration, right eye, intermediate dry stage: Secondary | ICD-10-CM | POA: Diagnosis not present

## 2023-10-25 DIAGNOSIS — H353121 Nonexudative age-related macular degeneration, left eye, early dry stage: Secondary | ICD-10-CM | POA: Diagnosis not present

## 2023-11-02 DIAGNOSIS — R531 Weakness: Secondary | ICD-10-CM | POA: Diagnosis not present

## 2023-11-02 DIAGNOSIS — C8338 Diffuse large B-cell lymphoma, lymph nodes of multiple sites: Secondary | ICD-10-CM | POA: Diagnosis not present

## 2023-11-02 DIAGNOSIS — C8339 Primary central nervous system lymphoma: Secondary | ICD-10-CM | POA: Diagnosis not present

## 2023-11-05 ENCOUNTER — Encounter: Payer: Self-pay | Admitting: Hematology & Oncology

## 2023-11-08 DIAGNOSIS — C8338 Diffuse large B-cell lymphoma, lymph nodes of multiple sites: Secondary | ICD-10-CM | POA: Diagnosis not present

## 2023-11-08 DIAGNOSIS — C8339 Primary central nervous system lymphoma: Secondary | ICD-10-CM | POA: Diagnosis not present

## 2023-11-08 DIAGNOSIS — R531 Weakness: Secondary | ICD-10-CM | POA: Diagnosis not present

## 2023-11-09 DIAGNOSIS — Z5111 Encounter for antineoplastic chemotherapy: Secondary | ICD-10-CM | POA: Diagnosis not present

## 2023-11-09 DIAGNOSIS — Z888 Allergy status to other drugs, medicaments and biological substances status: Secondary | ICD-10-CM | POA: Diagnosis not present

## 2023-11-09 DIAGNOSIS — R531 Weakness: Secondary | ICD-10-CM | POA: Diagnosis not present

## 2023-11-09 DIAGNOSIS — Z79899 Other long term (current) drug therapy: Secondary | ICD-10-CM | POA: Diagnosis not present

## 2023-11-09 DIAGNOSIS — C8339 Primary central nervous system lymphoma: Secondary | ICD-10-CM | POA: Diagnosis not present

## 2023-11-09 DIAGNOSIS — G8929 Other chronic pain: Secondary | ICD-10-CM | POA: Diagnosis not present

## 2023-11-09 DIAGNOSIS — Z885 Allergy status to narcotic agent status: Secondary | ICD-10-CM | POA: Diagnosis not present

## 2023-11-09 DIAGNOSIS — M25561 Pain in right knee: Secondary | ICD-10-CM | POA: Diagnosis not present

## 2023-11-09 DIAGNOSIS — Z882 Allergy status to sulfonamides status: Secondary | ICD-10-CM | POA: Diagnosis not present

## 2023-11-10 DIAGNOSIS — R531 Weakness: Secondary | ICD-10-CM | POA: Diagnosis not present

## 2023-11-10 DIAGNOSIS — C8339 Primary central nervous system lymphoma: Secondary | ICD-10-CM | POA: Diagnosis not present

## 2023-11-10 DIAGNOSIS — Z5111 Encounter for antineoplastic chemotherapy: Secondary | ICD-10-CM | POA: Diagnosis not present

## 2023-11-11 DIAGNOSIS — R531 Weakness: Secondary | ICD-10-CM | POA: Diagnosis not present

## 2023-11-11 DIAGNOSIS — C8339 Primary central nervous system lymphoma: Secondary | ICD-10-CM | POA: Diagnosis not present

## 2023-11-11 DIAGNOSIS — Z5111 Encounter for antineoplastic chemotherapy: Secondary | ICD-10-CM | POA: Diagnosis not present

## 2023-11-14 DIAGNOSIS — Z79899 Other long term (current) drug therapy: Secondary | ICD-10-CM | POA: Diagnosis not present

## 2023-11-14 DIAGNOSIS — R531 Weakness: Secondary | ICD-10-CM | POA: Diagnosis not present

## 2023-11-14 DIAGNOSIS — C8339 Primary central nervous system lymphoma: Secondary | ICD-10-CM | POA: Diagnosis not present

## 2023-11-14 DIAGNOSIS — D84821 Immunodeficiency due to drugs: Secondary | ICD-10-CM | POA: Diagnosis not present

## 2023-11-14 DIAGNOSIS — Z92850 Personal history of chimeric antigen receptor t-cell therapy: Secondary | ICD-10-CM | POA: Diagnosis not present

## 2023-11-14 DIAGNOSIS — D801 Nonfamilial hypogammaglobulinemia: Secondary | ICD-10-CM | POA: Diagnosis not present

## 2023-11-15 DIAGNOSIS — C8339 Primary central nervous system lymphoma: Secondary | ICD-10-CM | POA: Diagnosis not present

## 2023-11-15 DIAGNOSIS — R531 Weakness: Secondary | ICD-10-CM | POA: Diagnosis not present

## 2023-11-16 DIAGNOSIS — Z92850 Personal history of chimeric antigen receptor t-cell therapy: Secondary | ICD-10-CM | POA: Diagnosis not present

## 2023-11-16 DIAGNOSIS — C8339 Primary central nervous system lymphoma: Secondary | ICD-10-CM | POA: Diagnosis not present

## 2023-11-17 DIAGNOSIS — R799 Abnormal finding of blood chemistry, unspecified: Secondary | ICD-10-CM | POA: Diagnosis not present

## 2023-11-17 DIAGNOSIS — C8339 Primary central nervous system lymphoma: Secondary | ICD-10-CM | POA: Diagnosis not present

## 2023-11-18 DIAGNOSIS — R531 Weakness: Secondary | ICD-10-CM | POA: Diagnosis not present

## 2023-11-18 DIAGNOSIS — C8339 Primary central nervous system lymphoma: Secondary | ICD-10-CM | POA: Diagnosis not present

## 2023-11-19 DIAGNOSIS — C8339 Primary central nervous system lymphoma: Secondary | ICD-10-CM | POA: Diagnosis not present

## 2023-11-19 DIAGNOSIS — Z92850 Personal history of chimeric antigen receptor t-cell therapy: Secondary | ICD-10-CM | POA: Diagnosis not present

## 2023-11-19 DIAGNOSIS — D701 Agranulocytosis secondary to cancer chemotherapy: Secondary | ICD-10-CM | POA: Diagnosis not present

## 2023-11-19 DIAGNOSIS — T451X5A Adverse effect of antineoplastic and immunosuppressive drugs, initial encounter: Secondary | ICD-10-CM | POA: Diagnosis not present

## 2023-11-20 DIAGNOSIS — C8339 Primary central nervous system lymphoma: Secondary | ICD-10-CM | POA: Diagnosis not present

## 2023-11-20 DIAGNOSIS — M2550 Pain in unspecified joint: Secondary | ICD-10-CM | POA: Diagnosis not present

## 2023-11-20 DIAGNOSIS — Z9221 Personal history of antineoplastic chemotherapy: Secondary | ICD-10-CM | POA: Diagnosis not present

## 2023-11-20 DIAGNOSIS — Z888 Allergy status to other drugs, medicaments and biological substances status: Secondary | ICD-10-CM | POA: Diagnosis not present

## 2023-11-20 DIAGNOSIS — Z882 Allergy status to sulfonamides status: Secondary | ICD-10-CM | POA: Diagnosis not present

## 2023-11-20 DIAGNOSIS — Z885 Allergy status to narcotic agent status: Secondary | ICD-10-CM | POA: Diagnosis not present

## 2023-11-20 DIAGNOSIS — Z92850 Personal history of chimeric antigen receptor t-cell therapy: Secondary | ICD-10-CM | POA: Diagnosis not present

## 2023-11-20 DIAGNOSIS — Z79899 Other long term (current) drug therapy: Secondary | ICD-10-CM | POA: Diagnosis not present

## 2023-11-20 DIAGNOSIS — D8489 Other immunodeficiencies: Secondary | ICD-10-CM | POA: Diagnosis not present

## 2023-11-21 DIAGNOSIS — C8339 Primary central nervous system lymphoma: Secondary | ICD-10-CM | POA: Diagnosis not present

## 2023-11-21 DIAGNOSIS — D801 Nonfamilial hypogammaglobulinemia: Secondary | ICD-10-CM | POA: Diagnosis not present

## 2023-11-21 DIAGNOSIS — Z92850 Personal history of chimeric antigen receptor t-cell therapy: Secondary | ICD-10-CM | POA: Diagnosis not present

## 2023-11-23 DIAGNOSIS — M25512 Pain in left shoulder: Secondary | ICD-10-CM | POA: Diagnosis not present

## 2023-11-23 DIAGNOSIS — Z92850 Personal history of chimeric antigen receptor t-cell therapy: Secondary | ICD-10-CM | POA: Diagnosis not present

## 2023-11-23 DIAGNOSIS — D701 Agranulocytosis secondary to cancer chemotherapy: Secondary | ICD-10-CM | POA: Diagnosis not present

## 2023-11-23 DIAGNOSIS — C8339 Primary central nervous system lymphoma: Secondary | ICD-10-CM | POA: Diagnosis not present

## 2023-11-23 DIAGNOSIS — Z882 Allergy status to sulfonamides status: Secondary | ICD-10-CM | POA: Diagnosis not present

## 2023-11-23 DIAGNOSIS — Z9221 Personal history of antineoplastic chemotherapy: Secondary | ICD-10-CM | POA: Diagnosis not present

## 2023-11-23 DIAGNOSIS — D84821 Immunodeficiency due to drugs: Secondary | ICD-10-CM | POA: Diagnosis not present

## 2023-11-23 DIAGNOSIS — Z885 Allergy status to narcotic agent status: Secondary | ICD-10-CM | POA: Diagnosis not present

## 2023-11-23 DIAGNOSIS — T451X5A Adverse effect of antineoplastic and immunosuppressive drugs, initial encounter: Secondary | ICD-10-CM | POA: Diagnosis not present

## 2023-11-27 DIAGNOSIS — Z79899 Other long term (current) drug therapy: Secondary | ICD-10-CM | POA: Diagnosis not present

## 2023-11-27 DIAGNOSIS — Z882 Allergy status to sulfonamides status: Secondary | ICD-10-CM | POA: Diagnosis not present

## 2023-11-27 DIAGNOSIS — C8338 Diffuse large B-cell lymphoma, lymph nodes of multiple sites: Secondary | ICD-10-CM | POA: Diagnosis not present

## 2023-11-27 DIAGNOSIS — D84821 Immunodeficiency due to drugs: Secondary | ICD-10-CM | POA: Diagnosis not present

## 2023-11-27 DIAGNOSIS — Z92850 Personal history of chimeric antigen receptor t-cell therapy: Secondary | ICD-10-CM | POA: Diagnosis not present

## 2023-11-27 DIAGNOSIS — C8339 Primary central nervous system lymphoma: Secondary | ICD-10-CM | POA: Diagnosis not present

## 2023-11-27 DIAGNOSIS — D709 Neutropenia, unspecified: Secondary | ICD-10-CM | POA: Diagnosis not present

## 2023-11-30 DIAGNOSIS — R070 Pain in throat: Secondary | ICD-10-CM | POA: Diagnosis not present

## 2023-11-30 DIAGNOSIS — C8338 Diffuse large B-cell lymphoma, lymph nodes of multiple sites: Secondary | ICD-10-CM | POA: Diagnosis not present

## 2023-11-30 DIAGNOSIS — Z9221 Personal history of antineoplastic chemotherapy: Secondary | ICD-10-CM | POA: Diagnosis not present

## 2023-11-30 DIAGNOSIS — D849 Immunodeficiency, unspecified: Secondary | ICD-10-CM | POA: Diagnosis not present

## 2023-11-30 DIAGNOSIS — Z885 Allergy status to narcotic agent status: Secondary | ICD-10-CM | POA: Diagnosis not present

## 2023-11-30 DIAGNOSIS — C8339 Primary central nervous system lymphoma: Secondary | ICD-10-CM | POA: Diagnosis not present

## 2023-11-30 DIAGNOSIS — R058 Other specified cough: Secondary | ICD-10-CM | POA: Diagnosis not present

## 2023-11-30 DIAGNOSIS — Z92850 Personal history of chimeric antigen receptor t-cell therapy: Secondary | ICD-10-CM | POA: Diagnosis not present

## 2023-11-30 DIAGNOSIS — Z882 Allergy status to sulfonamides status: Secondary | ICD-10-CM | POA: Diagnosis not present

## 2023-12-13 ENCOUNTER — Other Ambulatory Visit: Payer: Self-pay

## 2023-12-14 DIAGNOSIS — Z792 Long term (current) use of antibiotics: Secondary | ICD-10-CM | POA: Diagnosis not present

## 2023-12-14 DIAGNOSIS — C8339 Primary central nervous system lymphoma: Secondary | ICD-10-CM | POA: Diagnosis not present

## 2023-12-14 DIAGNOSIS — R531 Weakness: Secondary | ICD-10-CM | POA: Diagnosis not present

## 2023-12-14 DIAGNOSIS — Z92850 Personal history of chimeric antigen receptor t-cell therapy: Secondary | ICD-10-CM | POA: Diagnosis not present

## 2023-12-14 DIAGNOSIS — Z882 Allergy status to sulfonamides status: Secondary | ICD-10-CM | POA: Diagnosis not present

## 2023-12-14 DIAGNOSIS — Z888 Allergy status to other drugs, medicaments and biological substances status: Secondary | ICD-10-CM | POA: Diagnosis not present

## 2023-12-14 DIAGNOSIS — Z885 Allergy status to narcotic agent status: Secondary | ICD-10-CM | POA: Diagnosis not present

## 2023-12-14 DIAGNOSIS — Z79899 Other long term (current) drug therapy: Secondary | ICD-10-CM | POA: Diagnosis not present

## 2023-12-31 ENCOUNTER — Telehealth: Payer: Self-pay

## 2023-12-31 NOTE — Telephone Encounter (Signed)
 [facility] is listed as PCP with insurance. Called to establish care or confirm she has a different PCP.

## 2024-01-02 ENCOUNTER — Other Ambulatory Visit: Payer: Self-pay

## 2024-01-03 ENCOUNTER — Other Ambulatory Visit: Payer: Self-pay

## 2024-01-03 ENCOUNTER — Ambulatory Visit

## 2024-01-15 ENCOUNTER — Telehealth: Payer: Self-pay | Admitting: *Deleted

## 2024-01-15 ENCOUNTER — Ambulatory Visit (INDEPENDENT_AMBULATORY_CARE_PROVIDER_SITE_OTHER): Admitting: *Deleted

## 2024-01-15 VITALS — Ht 66.0 in | Wt 155.0 lb

## 2024-01-15 DIAGNOSIS — Z0000 Encounter for general adult medical examination without abnormal findings: Secondary | ICD-10-CM | POA: Diagnosis not present

## 2024-01-15 NOTE — Progress Notes (Signed)
 Chief Complaint  Patient presents with   Medicare Wellness     Subjective:   Ariel [name] is a 68 y.o. female who presents for a Medicare Annual Wellness Visit.  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   Hyperglycemia 11/10/2020   Melanoma (HCC) 2021   right hip   Melanoma (HCC) 10/28/2020   pt stated brain liver and bladder   PONV (postoperative nausea and vomiting)    Stroke [facility])    Urinary incontinence    Past Surgical History:  Procedure Laterality Date   AIR/FLUID EXCHANGE Right 12/07/2020   Procedure: AIR/FLUID EXCHANGE;  Surgeon: Tobie Baptist, MD;  Location: [facility] OR;  Service: Ophthalmology;  Laterality: Right;   APPENDECTOMY  1962   BIOPSY  12/14/2020   Procedure: BIOPSY;  Surgeon: Wilhelmenia Aloha Raddle., MD;  Location: [facility] ENDOSCOPY;  Service: Gastroenterology;;   ESOPHAGOGASTRODUODENOSCOPY (EGD) WITH PROPOFOL  N/A 12/14/2020   Procedure: ESOPHAGOGASTRODUODENOSCOPY (EGD) WITH PROPOFOL ;  Surgeon: Wilhelmenia Aloha Raddle., MD;  Location: [facility] ENDOSCOPY;  Service: Gastroenterology;  Laterality: N/A;   EXCISION MELANOMA WITH SENTINEL LYMPH NODE BIOPSY Right 10/09/2019   Procedure: WIDE LOCAL EXCISION WITH ADVANCEMENT FLAP CLOSURE RIGHT HIP MELANOMA WITH SENTINEL LYMPH NODE BIOPSY;  Surgeon: Aron Shoulders, MD;  Location: [facility] OR;  Service: General;  Laterality: Right;   EYE SURGERY     FINE NEEDLE ASPIRATION  12/14/2020   Procedure: FINE NEEDLE ASPIRATION (FNA) LINEAR;  Surgeon: Wilhelmenia Aloha Raddle., MD;  Location: [facility] ENDOSCOPY;  Service: Gastroenterology;;   HERNIA REPAIR  2009   IR BONE MARROW  BIOPSY & ASPIRATION  05/08/2022   IR IMAGING GUIDED PORT INSERTION  12/30/2020   JOINT REPLACEMENT  2023   MOUTH SURGERY  11/2015   gum surgery and bone implant   PARS PLANA VITRECTOMY Right 12/07/2020   Procedure: PARS PLANA VITRECTOMY WITH 25 GAUGE;  Surgeon: Tobie Baptist, MD;  Location: [facility] OR;  Service: Ophthalmology;  Laterality: Right;   PHOTOCOAGULATION WITH LASER Right 12/07/2020   Procedure: PHOTOCOAGULATION WITH ENDOLASER PANRITINAL COAGULATION;  Surgeon: Tobie Baptist, MD;  Location: [facility] OR;  Service: Ophthalmology;  Laterality: Right;   TOTAL HIP ARTHROPLASTY Left 10/11/2021   Procedure: LEFT TOTAL HIP ARTHROPLASTY ANTERIOR APPROACH;  Surgeon: Sheril Coy, MD;  Location: [facility] ORS;  Service: Orthopedics;  Laterality: Left;   TOTAL HIP ARTHROPLASTY Right 01/10/2022   Procedure: RIGHT TOTAL HIP ARTHROPLASTY ANTERIOR APPROACH;  Surgeon: Sheril Coy, MD;  Location: [facility] ORS;  Service: Orthopedics;  Laterality: Right;   UPPER ESOPHAGEAL ENDOSCOPIC ULTRASOUND (EUS) N/A 12/14/2020   Procedure: UPPER ESOPHAGEAL ENDOSCOPIC ULTRASOUND (EUS);  Surgeon: Wilhelmenia Aloha Raddle., MD;  Location: [facility] ENDOSCOPY;  Service: Gastroenterology;  Laterality: N/A;   WISDOM TOOTH EXTRACTION     Family History  Problem Relation Age of Onset   Coronary artery disease Father        died at 64   COPD Father    Alcohol abuse Father    Diabetes Mellitus II Father    Coronary artery disease Sister    Cancer Sister        breast   Heart attack Brother  Hyperlipidemia Other        died 93- natural causes   Hyperlipidemia Other        4 siblings    Hypertension Other        3 siblings   Social History   Occupational History   Occupation: retired  Tobacco Use   Smoking status: Never   Smokeless tobacco: Never  Vaping Use   Vaping status: Never Used  Substance and Sexual Activity   Alcohol use: Never   Drug use: Never   Sexual activity: Not Currently   Tobacco Counseling Counseling  given: Not Answered  SDOH Screenings   Food Insecurity: No Food Insecurity (01/15/2024)  Housing: Low Risk  (01/15/2024)  Transportation Needs: No Transportation Needs (01/15/2024)  Utilities: Not At Risk (01/15/2024)  Alcohol Screen: Low Risk  (09/05/2022)  Depression (PHQ2-9): Low Risk  (01/15/2024)  Financial Resource Strain: Low Risk  (09/05/2022)  Physical Activity: Sufficiently Active (01/15/2024)  Social Connections: Moderately Isolated (01/15/2024)  Stress: No Stress Concern Present (01/15/2024)  Tobacco Use: Low Risk  (01/15/2024)   See flowsheets for full screening details  Depression Screen PHQ 2 & 9 Depression Scale- Over the past 2 weeks, how often have you been bothered by any of the following problems? Little interest or pleasure in doing things: 0 Feeling down, depressed, or hopeless (PHQ Adolescent also includes...irritable): 0 PHQ-2 Total Score: 0 Trouble falling or staying asleep, or sleeping too much: 0 Feeling tired or having little energy: 0 Poor appetite or overeating (PHQ Adolescent also includes...weight loss): 0 Feeling bad about yourself - or that you are a failure or have let yourself or your family down: 0 Trouble concentrating on things, such as reading the newspaper or watching television (PHQ Adolescent also includes...like school work): 0 Moving or speaking so slowly that other people could have noticed. Or the opposite - being so fidgety or restless that you have been moving around a lot more than usual: 0 Thoughts that you would be better off dead, or of hurting yourself in some way: 0 PHQ-9 Total Score: 0     Goals Addressed             This Visit's Progress    Patient Stated   On track    Increase activity     To continue gaining strength and return to [facility] on a regular basis         Visit info / Clinical Intake: Medicare Wellness Visit Type:: Subsequent Annual Wellness Visit Persons participating in visit:: patient Medicare  Wellness Visit Mode:: Telephone If telephone:: video declined Because this visit was a virtual/telehealth visit:: pt reported vitals If Telephone or Video please confirm:: I connected with the patient using audio enabled telemedicine application and verified that I am speaking with the correct person using two identifiers; I discussed the limitations of evaluation and management by telemedicine; The patient expressed understanding and agreed to proceed Patient Location:: home Provider Location:: office Information given by:: patient Interpreter Needed?: No Pre-visit prep was completed: yes AWV questionnaire completed by patient prior to visit?: no Living arrangements:: in retirement community Patient's Overall Health Status Rating: good Typical amount of pain: some Does pain affect daily life?: no Are you currently prescribed opioids?: no  Dietary Habits and Nutritional Risks How many meals a day?: (!) 1 (1 main meal, breakfast bar in morning.) Eats fruit and vegetables daily?: yes Most meals are obtained by: having others provide food (eats in dining area at the retirement community) In the  last 2 weeks, have you had any of the following?: (!) nausea, vomiting, diarrhea (had a little diarrhea with a new medication and has resolved) Diabetic:: no  Functional Status Activities of Daily Living (to include ambulation/medication): Independent Ambulation: Independent Medication Administration: Independent Home Management: Independent Manage your own finances?: yes Primary transportation is: driving Concerns about vision?: no *vision screening is required for WTM* (up to date with [facility] Ophthalmology & Dr Tobie (Retina specialist)) Concerns about hearing?: no  Fall Screening Falls in the past year?: 0 Number of falls in past year: 0 Was there an injury with Fall?: 0 Fall Risk Category Calculator: 0 Patient Fall Risk Level: Low Fall Risk  Fall Risk Patient at Risk for Falls Due  to: No Fall Risks Fall risk Follow up: Falls evaluation completed  Home and Transportation Safety: All rugs have non-skid backing?: yes All stairs or steps have railings?: (!) no Grab bars in the bathtub or shower?: yes Have non-skid surface in bathtub or shower?: yes Good home lighting?: yes Regular seat belt use?: yes Hospital stays in the last year:: (!) yes How many hospital stays:: 1 Reason: had chemo treatments in [facility]  Cognitive Assessment Difficulty concentrating, remembering, or making decisions? : yes (minor instances per pt) What year is it?: 0 points What month is it?: 0 points Give patient an address phrase to remember (5 components): [address] [place]  About what time is it?: 0 points Count backwards from 20 to 1: 2 points Say the months of the year in reverse: 0 points Repeat the address phrase from earlier: 0 points 6 CIT Score: 2 points  Advance Directives (For Healthcare) Does Patient Have a Medical Advance Directive?: Yes Does patient want to make changes to medical advance directive?: No - Patient declined Type of Advance Directive: Healthcare Power of [employer]; Living will Copy of Healthcare Power of Attorney in Chart?: No - copy requested Copy of Living Will in Chart?: No - copy requested Out of facility DNR (pink MOST or yellow form) in Chart? (Ambulatory ONLY): No - copy requested Would patient like information on creating a medical advance directive?: No - Patient declined  Reviewed/Updated  Reviewed/Updated: Reviewed All (Medical, Surgical, Family, Medications, Allergies, Care Teams, Patient Goals)        Objective:    Today's Vitals   01/15/24 1020  Weight: 155 lb (70.3 kg)  Height: 5' 6 (1.676 m)   Body mass index is 25.02 kg/m.  Current Medications (verified) Outpatient Encounter Medications as of 01/15/2024  Medication Sig   acetaminophen  (TYLENOL ) 500 MG tablet Take 1,000 mg by mouth every 6 (six) hours as needed  for headache, fever, moderate pain (pain score 4-6) or mild pain (pain score 1-3).   acyclovir  (ZOVIRAX ) 400 MG tablet Take 400 mg by mouth 2 (two) times daily.   albuterol  (VENTOLIN  HFA) 108 (90 Base) MCG/ACT inhaler Inhale 2 puffs into the lungs every 6 (six) hours as needed for wheezing or shortness of breath.   dapsone 100 MG tablet Take 100 mg by mouth daily.   ondansetron  (ZOFRAN -ODT) 8 MG disintegrating tablet Take 1 tablet (8 mg total) by mouth every 8 (eight) hours as needed for nausea or vomiting.   prochlorperazine  (COMPAZINE ) 10 MG tablet Take 1 tablet (10 mg total) by mouth every 6 (six) hours as needed for nausea or vomiting.   [DISCONTINUED] ciprofloxacin  (CIPRO ) 500 MG tablet Take 1 tablet (500 mg total) by mouth daily with breakfast.   [DISCONTINUED] lidocaine -prilocaine  (EMLA ) cream Apply  to affected area once   [DISCONTINUED] temozolomide  (TEMODAR ) 100 MG capsule Take 2 capsules (200 mg total) by mouth daily. Take for 5 days on, 23 days off. Repeat every 28 days. May take on an empty stomach or at bedtime to decrease nausea & vomiting. (Patient not taking: Reported on 08/02/2023)   Facility-Administered Encounter Medications as of 01/15/2024  Medication   sodium chloride  flush (NS) 0.9 % injection 10 mL   Hearing/Vision screen No results found. Immunizations and Health Maintenance Health Maintenance  Topic Date Due   COVID-19 Vaccine (2 - Janssen risk series) 06/27/2019   Pneumococcal Vaccine: 50+ Years (2 of 2 - PPSV23, PCV20, or PCV21) 09/02/2020   Mammogram  08/03/2021   Influenza Vaccine  09/28/2023   Colonoscopy  01/14/2025 (Originally 03/02/2018)   DTaP/Tdap/Td (3 - Td or Tdap) 09/16/2030   DEXA SCAN  Completed   Hepatitis C Screening  Completed   Zoster Vaccines- Shingrix   Completed   Meningococcal B Vaccine  Aged Out        Assessment/Plan:  This is a routine wellness examination for [facility].  Patient Care Team: Daryl Setter, NP as PCP - General  (Internal Medicine) Genette Charlie GAILS, MD (Obstetrics and Gynecology) Tat, Asberry RAMAN, DO as Consulting Physician (Neurology) Timmy Maude SAUNDERS, MD as Medical Oncologist (Oncology) Tobie Baptist, MD as Consulting Physician (Ophthalmology)  I have personally reviewed and noted the following in the patient's chart:   Medical and social history Use of alcohol, tobacco or illicit drugs  Current medications and supplements including opioid prescriptions. Functional ability and status Nutritional status Physical activity Advanced directives List of other physicians Hospitalizations, surgeries, and ER visits in previous 12 months Vitals Screenings to include cognitive, depression, and falls Referrals and appointments  No orders of the defined types were placed in this encounter.  In addition, I have reviewed and discussed with patient certain preventive protocols, quality metrics, and best practice recommendations. A written personalized care plan for preventive services as well as general preventive health recommendations were provided to patient.   Lolita Libra, CMA   01/15/2024   Return in 1 year (on 01/14/2025).  After Visit Summary: (MyChart) Due to this being a telephonic visit, the after visit summary with patients personalized plan was offered to patient via MyChart   Nurse Notes: see phone note

## 2024-01-15 NOTE — Telephone Encounter (Signed)
 Noted

## 2024-01-15 NOTE — Telephone Encounter (Signed)
 FYI:  Pt had AWV today. She has OV with PCP on 02/15/24.  She underwent CAR-T treatment in September in [place]. Will be following up with them for immunization recommendations. Prefers to hold off on colonoscopy and mammogram as she states she has had multiple MRIs and Pet Scans over the last year.

## 2024-01-15 NOTE — Patient Instructions (Addendum)
 Ariel [name],  Thank you for taking the time for your Medicare Wellness Visit. I appreciate your continued commitment to your health goals. Please review the care plan we discussed, and feel free to reach out if I can assist you further.  Please note that Annual Wellness Visits do not include a physical exam. Some assessments may be limited, especially if the visit was conducted virtually. If needed, we may recommend an in-person follow-up with your provider.  Goals: To continue gaining strength and return to [facility] on a regular basis   Ongoing Care Seeing your primary care provider every 3 to 6 months helps us  monitor your health and provide consistent, personalized care.   Ariel Ponto, NP:  02/15/24 11:40am  Annual Wellness Visit:  01/15/25  10:20am, telephone  Recommended Screenings:  Health Maintenance  Topic Date Due   COVID-19 Vaccine (2 - Janssen risk series) 06/27/2019   Pneumococcal Vaccine for age over 17 (2 of 2 - PPSV23, PCV20, or PCV21) 09/02/2020   Breast Cancer Screening  08/03/2021   Flu Shot  09/28/2023   Colon Cancer Screening  01/14/2025*   DTaP/Tdap/Td vaccine (3 - Td or Tdap) 09/16/2030   DEXA scan (bone density measurement)  Completed   Hepatitis C Screening  Completed   Zoster (Shingles) Vaccine  Completed   Meningitis B Vaccine  Aged Out  *Topic was postponed. The date shown is not the original due date.       01/15/2024   10:24 AM  Advanced Directives  Does Patient Have a Medical Advance Directive? Yes  Type of Estate Agent of [place];Living will  Does patient want to make changes to medical advance directive? No - Patient declined  Copy of Healthcare Power of Attorney in Chart? No - copy requested   You may return a copy of your Advanced Directive(s) by either of the following:  Bring a copy of your health care power of attorney and living will to the office to be added to your chart at your convenience. You can mail a  copy to [facility] [address] [place], [place] [address] or email to [email or web address]   Vision: Annual vision screenings are recommended for early detection of glaucoma, cataracts, and diabetic retinopathy. These exams can also reveal signs of chronic conditions such as diabetes and high blood pressure.  Dental: Annual dental screenings help detect early signs of oral cancer, gum disease, and other conditions linked to overall health, including heart disease and diabetes.  Please see the attached documents for additional preventive care recommendations.

## 2024-01-17 ENCOUNTER — Other Ambulatory Visit: Payer: Self-pay

## 2024-02-15 ENCOUNTER — Ambulatory Visit: Admitting: Family

## 2024-02-15 VITALS — BP 127/55 | HR 89 | Temp 98.6°F | Resp 16 | Ht 66.0 in | Wt 169.0 lb

## 2024-02-15 DIAGNOSIS — Z0000 Encounter for general adult medical examination without abnormal findings: Secondary | ICD-10-CM | POA: Insufficient documentation

## 2024-02-15 DIAGNOSIS — Z1231 Encounter for screening mammogram for malignant neoplasm of breast: Secondary | ICD-10-CM

## 2024-02-15 DIAGNOSIS — Z780 Asymptomatic menopausal state: Secondary | ICD-10-CM | POA: Diagnosis not present

## 2024-02-15 DIAGNOSIS — Z1211 Encounter for screening for malignant neoplasm of colon: Secondary | ICD-10-CM | POA: Diagnosis not present

## 2024-02-15 DIAGNOSIS — C8580 Other specified types of non-Hodgkin lymphoma, unspecified site: Secondary | ICD-10-CM

## 2024-02-15 DIAGNOSIS — J069 Acute upper respiratory infection, unspecified: Secondary | ICD-10-CM | POA: Insufficient documentation

## 2024-02-15 NOTE — Assessment & Plan Note (Signed)
" °  Routine adult wellness visit focused on preventive care and screenings. - Ordered mammogram. - Ordered bone density scan. - Ordered Cologuard stool kit. - Discussed flu shot availability. - Discussed RSV vaccine availability. - Discussed pneumonia booster. - Discussed shingles vaccine. "

## 2024-02-15 NOTE — Patient Instructions (Signed)
" °  VISIT SUMMARY: Today, you came in for a routine adult wellness visit and to address your recent voice loss and ear congestion following your CAR T-cell therapy. We discussed various preventive care measures and reviewed your recent health history, including your successful CAR T-cell therapy and current remission status.  YOUR PLAN: -ADULT WELLNESS VISIT: This visit focused on preventive care and screenings to maintain your overall health. We have ordered a mammogram, a bone density scan, and a Cologuard stool kit for you. We also discussed the availability of the flu shot, RSV vaccine, pneumonia booster, and shingles vaccine.  -ACUTE UPPER RESPIRATORY INFECTION: You have a mild upper respiratory infection, likely caused by a virus such as rhinovirus. Your COVID test was negative, and you do not need antibiotics at this time. Please monitor your symptoms and report if they worsen.  -LYMPHOMA, POST CAR T-CELL THERAPY, IN REMISSION: Your lymphoma is in remission following your CAR T-cell therapy, and your recent MRI and PET scans were clear. You are currently not experiencing any side effects. Continue to monitor for any new symptoms or changes.  INSTRUCTIONS: Please follow up with the ordered mammogram, bone density scan, and Cologuard stool kit. Monitor your upper respiratory infection symptoms and report if they worsen. Continue to stay vigilant for any new symptoms or changes related to your lymphoma remission status.                    "

## 2024-02-15 NOTE — Progress Notes (Signed)
 "  Subjective:     Patient ID: Ariel [name], female    DOB: 12-28-1955, 68 y.o.   MRN: [id number]  Chief Complaint  Patient presents with   Annual Exam    HPI  Discussed the use of AI scribe software for clinical note transcription with the patient, who gave verbal consent to proceed.  History of Present Illness Ariel [name] is a 68 year old female who presents for her annual physical.  She presents today with with voice loss and ear congestion.  She lost her voice approximately three days ago. There is no sore throat, runny nose, or fever, but she feels congested in her ear. A COVID test was performed by hem oncologist recently and was negative.  She recently underwent CAR T-cell therapy at [facility], staying for three weeks. She tolerated the treatment well with no side effects and found it effective. MRI and PET scans were clear. She was not placed on antibiotics post-treatment.  She has not had a mammogram this year. She has not received a flu shot this season due to her recent CAR T-cell therapy, which affected her immune system. She received a pneumonia shot in 2022 and is due for a booster.  Her joints, particularly her knees and shoulder, have been affected, which she attributes to her treatment journey. She has lost her hair and is wearing a wig, which she finds convenient.  She maintains a healthy diet and exercises regularly, walking on a treadmill every morning for 30 minutes and walking outside when possible. She is up to date on dental and vision check-ups, and she monitors her dental health closely due to the impact of chemotherapy.        Health Maintenance Due  Topic Date Due   COVID-19 Vaccine (2 - Janssen risk series) 06/27/2019   Pneumococcal Vaccine: 50+ Years (2 of 2 - PPSV23, PCV20, or PCV21) 09/02/2020   Mammogram  08/03/2021    Past Medical History:  Diagnosis Date   Allergy    Arthritis    Cataract    Chicken pox    Complication of anesthesia     Per patient, very slow to wake up after anesthesia   Goals of care, counseling/discussion 12/22/2020   High cholesterol    High grade B-cell lymphoma (HCC) 12/22/2020   Hyperglycemia 11/10/2020   Melanoma (HCC) 2021   right hip   Melanoma (HCC) 10/28/2020   pt stated brain liver and bladder   PONV (postoperative nausea and vomiting)    Stroke [facility])    Urinary incontinence     Past Surgical History:  Procedure Laterality Date   AIR/FLUID EXCHANGE Right 12/07/2020   Procedure: AIR/FLUID EXCHANGE;  Surgeon: Tobie Baptist, MD;  Location: [facility] OR;  Service: Ophthalmology;  Laterality: Right;   APPENDECTOMY  1962   BIOPSY  12/14/2020   Procedure: BIOPSY;  Surgeon: Wilhelmenia Aloha Raddle., MD;  Location: [facility] ENDOSCOPY;  Service: Gastroenterology;;   ESOPHAGOGASTRODUODENOSCOPY (EGD) WITH PROPOFOL  N/A 12/14/2020   Procedure: ESOPHAGOGASTRODUODENOSCOPY (EGD) WITH PROPOFOL ;  Surgeon: Wilhelmenia Aloha Raddle., MD;  Location: [facility] ENDOSCOPY;  Service: Gastroenterology;  Laterality: N/A;   EXCISION MELANOMA WITH SENTINEL LYMPH NODE BIOPSY Right 10/09/2019   Procedure: WIDE LOCAL EXCISION WITH ADVANCEMENT FLAP CLOSURE RIGHT HIP MELANOMA WITH SENTINEL LYMPH NODE BIOPSY;  Surgeon: Aron Shoulders, MD;  Location: [facility] OR;  Service: General;  Laterality: Right;   EYE SURGERY     FINE NEEDLE ASPIRATION  12/14/2020   Procedure: FINE NEEDLE ASPIRATION (FNA) LINEAR;  Surgeon:  Mansouraty, Aloha Raddle., MD;  Location: [facility] ENDOSCOPY;  Service: Gastroenterology;;   HERNIA REPAIR  2009   IR BONE MARROW BIOPSY & ASPIRATION  05/08/2022   IR IMAGING GUIDED PORT INSERTION  12/30/2020   JOINT REPLACEMENT  2023   MOUTH SURGERY  11/2015   gum surgery and bone implant   PARS PLANA VITRECTOMY Right 12/07/2020   Procedure: PARS PLANA VITRECTOMY WITH 25 GAUGE;  Surgeon: Tobie Baptist, MD;  Location: [facility] OR;  Service: Ophthalmology;  Laterality: Right;   PHOTOCOAGULATION WITH LASER Right 12/07/2020   Procedure:  PHOTOCOAGULATION WITH ENDOLASER PANRITINAL COAGULATION;  Surgeon: Tobie Baptist, MD;  Location: [facility] OR;  Service: Ophthalmology;  Laterality: Right;   TOTAL HIP ARTHROPLASTY Left 10/11/2021   Procedure: LEFT TOTAL HIP ARTHROPLASTY ANTERIOR APPROACH;  Surgeon: Sheril Coy, MD;  Location: [facility] ORS;  Service: Orthopedics;  Laterality: Left;   TOTAL HIP ARTHROPLASTY Right 01/10/2022   Procedure: RIGHT TOTAL HIP ARTHROPLASTY ANTERIOR APPROACH;  Surgeon: Sheril Coy, MD;  Location: [facility] ORS;  Service: Orthopedics;  Laterality: Right;   UPPER ESOPHAGEAL ENDOSCOPIC ULTRASOUND (EUS) N/A 12/14/2020   Procedure: UPPER ESOPHAGEAL ENDOSCOPIC ULTRASOUND (EUS);  Surgeon: Wilhelmenia Aloha Raddle., MD;  Location: [facility] ENDOSCOPY;  Service: Gastroenterology;  Laterality: N/A;   WISDOM TOOTH EXTRACTION      Family History  Problem Relation Age of Onset   Coronary artery disease Father        died at 78   COPD Father    Alcohol abuse Father    Diabetes Mellitus II Father    Coronary artery disease Sister    Cancer Sister        breast   Heart attack Brother    Hyperlipidemia Other        died 45- natural causes   Hyperlipidemia Other        4 siblings    Hypertension Other        3 siblings    Social History   Socioeconomic History   Marital status: Widowed    Spouse name: Not on file   Number of children: Not on file   Years of education: Not on file   Highest education level: GED or equivalent  Occupational History   Occupation: retired  Tobacco Use   Smoking status: Never   Smokeless tobacco: Never  Vaping Use   Vaping status: Never Used  Substance and Sexual Activity   Alcohol use: Never   Drug use: Never   Sexual activity: Not Currently  Other Topics Concern   Not on file  Social History Narrative   05/09/2021   Presently living at [facility] in [place].   One Son- lives locally, 1 grand-daughter[place]aughter- 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   Right handed   Home one level    Caffeine 1-cup   Social Drivers of Health   Tobacco Use: Low Risk (02/13/2024)   [facility]   Patient History    Smoking Tobacco Use: Never    Smokeless Tobacco Use: Never    Passive Exposure: Not on file  Financial Resource Strain: Patient Declined (02/08/2024)   Overall Financial Resource Strain (CARDIA)    Difficulty of Paying Living Expenses: Patient declined  Food Insecurity: Patient Declined (02/08/2024)   Epic    Worried About Programme Researcher, Broadcasting/film/video in the Last Year: Patient declined    Barista in the Last Year: Patient declined  Transportation Needs: Patient Declined (02/08/2024)   Epic  Lack of Transportation (Medical): Patient declined    Lack of Transportation (Non-Medical): Patient declined  Physical Activity: Sufficiently Active (02/08/2024)   Exercise Vital Sign    Days of Exercise per Week: 7 days    Minutes of Exercise per Session: 30 min  Stress: No Stress Concern Present (02/08/2024)   [employer] of Occupational Health - Occupational Stress Questionnaire    Feeling of Stress: Not at all  Social Connections: Unknown (02/08/2024)   Social Connection and Isolation Panel    Frequency of Communication with Friends and Family: Patient declined    Frequency of Social Gatherings with Friends and Family: Patient declined    Attends Religious Services: Patient declined    Database Administrator or Organizations: Yes    Attends Banker Meetings: Patient declined    Marital Status: Widowed  Recent Concern: Social Connections - Moderately Isolated (01/15/2024)   Social Connection and Isolation Panel    Frequency of Communication with Friends and Family: More than three times a week    Frequency of Social Gatherings with Friends and Family: Twice a week    Attends Religious Services: 1 to 4 times per year    Active Member of [employer] or Organizations: No    Attends Tax Inspector Meetings: Never    Marital Status: Widowed  Intimate Partner Violence: Not At Risk (01/15/2024)   Epic    Fear of Current or Ex-Partner: No    Emotionally Abused: No    Physically Abused: No    Sexually Abused: No  Depression (PHQ2-9): Low Risk (01/15/2024)   Depression (PHQ2-9)    PHQ-2 Score: 0  Alcohol Screen: Low Risk (09/05/2022)   Alcohol Screen    Last Alcohol Screening Score (AUDIT): 0  Housing: Unknown (02/08/2024)   Epic    Unable to Pay for Housing in the Last Year: Patient declined    Number of Times Moved in the Last Year: Not on file    Homeless in the Last Year: No  Utilities: Not At Risk (01/15/2024)   Epic    Threatened with loss of utilities: No  Health Literacy: Not on file    Outpatient Medications Prior to Visit  Medication Sig Dispense Refill   atovaquone (MEPRON) 750 MG/5ML suspension Take 1,500 mg by mouth.     acetaminophen  (TYLENOL ) 500 MG tablet Take 1,000 mg by mouth every 6 (six) hours as needed for headache, fever, moderate pain (pain score 4-6) or mild pain (pain score 1-3).     acyclovir  (ZOVIRAX ) 400 MG tablet Take 400 mg by mouth 2 (two) times daily.     albuterol  (VENTOLIN  HFA) 108 (90 Base) MCG/ACT inhaler Inhale 2 puffs into the lungs every 6 (six) hours as needed for wheezing or shortness of breath. 8 g 2   dapsone 100 MG tablet Take 100 mg by mouth daily.     ondansetron  (ZOFRAN -ODT) 8 MG disintegrating tablet Take 1 tablet (8 mg total) by mouth every 8 (eight) hours as needed for nausea or vomiting. 30 tablet 1   prochlorperazine  (COMPAZINE ) 10 MG tablet Take 1 tablet (10 mg total) by mouth every 6 (six) hours as needed for nausea or vomiting. 30 tablet 1   Facility-Administered Medications Prior to Visit  Medication Dose Route Frequency Provider Last Rate Last Admin   sodium chloride  flush (NS) 0.9 % injection 10 mL  10 mL Intravenous PRN Ennever, Peter R, MD   10 mL at 10/04/21 0946    Allergies[1]  ROS  See HPI     Objective:    Physical Exam   BP (!) 127/55 (BP Location: Right Arm, Patient Position: Sitting, Cuff Size: Normal)   Pulse 89   Temp 98.6 F (37 C) (Oral)   Resp 16   Ht 5' 6 (1.676 m)   Wt 169 lb (76.7 kg)   SpO2 98%   BMI 27.28 kg/m  Wt Readings from Last 3 Encounters:  02/15/24 169 lb (76.7 kg)  01/15/24 155 lb (70.3 kg)  09/06/23 159 lb 12.8 oz (72.5 kg)    Physical Exam  Constitutional: She is oriented to person, place, and time. She appears well-developed and well-nourished. No distress.  HENT: + Voice hoarseness noted Head: Normocephalic and atraumatic.  Right Ear: Tympanic membrane and ear canal normal.  Left Ear: Tympanic membrane and ear canal normal.  Mouth/Throat: Oropharynx is clear and moist.  Eyes: Pupils are equal, round, and reactive to light. No scleral icterus.  Neck: Normal range of motion. No thyromegaly present.  Cardiovascular: Normal rate and regular rhythm.   No murmur heard. Pulmonary/Chest: Effort normal and breath sounds normal. No respiratory distress. He has no wheezes. She has no rales. She exhibits no tenderness.  Abdominal: Soft. Bowel sounds are normal. She exhibits no distension and no mass. There is no tenderness. There is no rebound and no guarding.  Musculoskeletal: She exhibits no edema.  Lymphadenopathy:    She has no cervical adenopathy.  Neurological: She is alert and oriented to person, place, and time.She exhibits normal muscle tone. Coordination normal.  Skin: Skin is warm and dry.  Psychiatric: She has a normal mood and affect. Her behavior is normal. Judgment and thought content normal.  Breast/Pelvic:  deferred         Assessment & Plan:       Assessment & Plan:   Problem List Items Addressed This Visit       Unprioritized   Viral URI    Acute upper respiratory infection Likely viral, possibly rhinovirus. Symptoms mild, COVID test negative. No antibiotics needed. - Monitor symptoms, report if worsens.       Relevant Medications   atovaquone (MEPRON) 750 MG/5ML suspension   Preventative health care    Routine adult wellness visit focused on preventive care and screenings. - Ordered mammogram. - Ordered bone density scan. - Ordered Cologuard stool kit. - Discussed flu shot availability. - Discussed RSV vaccine availability. - Discussed pneumonia booster. - Discussed shingles vaccine.      Lymphoma malignant, large cell (HCC)    Lymphoma, post CAR T-cell therapy, in remission Lymphoma in remission post CAR T-cell therapy. Recent MRI and PET scan clear. No side effects. Patient at peace with current status. - Continue monitoring for new symptoms or changes.      Relevant Medications   atovaquone (MEPRON) 750 MG/5ML suspension   Other Visit Diagnoses       Breast cancer screening by mammogram    -  Primary   Relevant Orders   MM 3D SCREENING MAMMOGRAM BILATERAL BREAST     Screening for colon cancer       Relevant Orders   Cologuard     Postmenopausal estrogen deficiency       Relevant Orders   DG Bone Density       Assessment & Plan     I am having Ariel [name] maintain her acetaminophen , ondansetron , prochlorperazine , albuterol , acyclovir , dapsone, and atovaquone.  No orders of the defined types were placed in this encounter.     [  1]  Allergies Allergen Reactions   Decadron  [Dexamethasone ] Other (See Comments)    Leg weakness Hallucinations   Plavix  [Clopidogrel ] Hives, Swelling and Other (See Comments)    Lips became swollen   Prednisone Other (See Comments)    Steroids caused psychological issues  Hallucinations   Sulfa  Antibiotics Itching and Rash   Codeine Nausea And Vomiting   "

## 2024-02-15 NOTE — Assessment & Plan Note (Signed)
" °  Lymphoma, post CAR T-cell therapy, in remission Lymphoma in remission post CAR T-cell therapy. Recent MRI and PET scan clear. No side effects. Patient at peace with current status. - Continue monitoring for new symptoms or changes. "

## 2024-02-15 NOTE — Assessment & Plan Note (Signed)
" °  Acute upper respiratory infection Likely viral, possibly rhinovirus. Symptoms mild, COVID test negative. No antibiotics needed. - Monitor symptoms, report if worsens. "

## 2024-02-16 ENCOUNTER — Other Ambulatory Visit: Payer: Self-pay

## 2024-02-19 ENCOUNTER — Ambulatory Visit: Payer: Self-pay

## 2024-02-19 DIAGNOSIS — J40 Bronchitis, not specified as acute or chronic: Secondary | ICD-10-CM

## 2024-02-19 MED ORDER — AZITHROMYCIN 250 MG PO TABS: ORAL_TABLET | ORAL | 0 refills | Status: AC

## 2024-02-19 MED ORDER — BENZONATATE 100 MG PO CAPS: 100.0000 mg | ORAL_CAPSULE | Freq: Two times a day (BID) | ORAL | 0 refills | Status: AC | PRN

## 2024-02-19 NOTE — Telephone Encounter (Signed)
 I have sent rx for azithromycin  and tessalon  to her pharmacy. If this treatment does not help her symptoms, please call for follow up appointment or go to nearest urgent care.

## 2024-02-19 NOTE — Telephone Encounter (Signed)
 Patient notified of new prescriptions and advised to call for follow up if no improvement

## 2024-02-19 NOTE — Telephone Encounter (Signed)
 FYI Only or Action Required?: Action required by provider: clinical question for provider.  Patient was last seen in primary care on 02/15/2024 by Daryl Setter, NP.  Called Nurse Triage reporting Cough.  Symptoms began x worsening cough x 2 days.  Interventions attempted: Nothing.  Symptoms are: gradually worsening.  Triage Disposition: See Physician Within 24 Hours  Patient/caregiver understands and will follow disposition?: No, wishes to speak with PCP  Copied from CRM #[id number]. Topic: Clinical - Red Word Triage >> Feb 19, 2024  9:51 AM Chasity T wrote: [facility] that prompted transfer to Nurse Triage: Patient is wanting PCP to be aware that her symptoms has gotten worse with a bad cough and her voice is now gone. She is wanting to know if she can get a prescription sent over to pharmacy to help her get better. Please contact her to discuss. Reason for Disposition  SEVERE coughing spells (e.g., whooping sound after coughing, vomiting after coughing)  Answer Assessment - Initial Assessment Questions 1. ONSET: When did the cough begin?      Worsening last x 2 days 2. SEVERITY: How bad is the cough today?      moderate 3. SPUTUM: Describe the color of your sputum (e.g., none, dry cough; clear, white, yellow, green)     Yellow - coughing some up and mostly comes from nose 4. HEMOPTYSIS: Are you coughing up any blood? If Yes, ask: How much? (e.g., flecks, streaks, tablespoons, etc.)     na 5. DIFFICULTY BREATHING: Are you having difficulty breathing? If Yes, ask: How bad is it? (e.g., mild, moderate, severe)      no 6. FEVER: Do you have a fever? If Yes, ask: What is your temperature, how was it measured, and when did it start?     no 7. CARDIAC HISTORY: Do you have any history of heart disease? (e.g., heart attack, congestive heart failure)      na 8. LUNG HISTORY: Do you have any history of lung disease?  (e.g., pulmonary embolus, asthma, emphysema)      na 9. PE RISK FACTORS: Do you have a history of blood clots? (or: recent major surgery, recent prolonged travel, bedridden)     na 10. OTHER SYMPTOMS: Do you have any other symptoms? (e.g., runny nose, wheezing, chest pain)       no 11. PREGNANCY: Is there any chance you are pregnant? When was your last menstrual period?       na 12. TRAVEL: Have you traveled out of the country in the last month? (e.g., travel history, exposures)       Na  Pt had blood work drawn after taking a medication she was on and showed her white count is low at present time therefore states - it is taking her longer to get over her cold.  Last 2 days her cough has worsened. Would like to request if possible something be called in for cough like tessalon  pearls or something: stated hardly getting rest due to coughing.  Please send something to pharmacy to help.  Protocols used: Cough - Acute Productive-A-AH

## 2024-02-22 ENCOUNTER — Ambulatory Visit (INDEPENDENT_AMBULATORY_CARE_PROVIDER_SITE_OTHER)

## 2024-02-22 ENCOUNTER — Ambulatory Visit: Payer: Self-pay

## 2024-02-22 ENCOUNTER — Ambulatory Visit: Admission: EM | Admit: 2024-02-22 | Discharge: 2024-02-22 | Disposition: A | Discharge status: Home / Self Care

## 2024-02-22 DIAGNOSIS — J069 Acute upper respiratory infection, unspecified: Secondary | ICD-10-CM

## 2024-02-22 LAB — POCT INFLUENZA A/B
Influenza A, POC: NEGATIVE
Influenza B, POC: NEGATIVE

## 2024-02-22 MED ORDER — PROMETHAZINE-DM 6.25-15 MG/5ML PO SYRP: 10.0000 mL | ORAL_SOLUTION | Freq: Three times a day (TID) | ORAL | 0 refills | Status: DC | PRN

## 2024-02-22 MED ORDER — AZELASTINE HCL 0.1 % NA SOLN: 1.0000 | Freq: Two times a day (BID) | NASAL | 1 refills | Status: AC

## 2024-02-22 NOTE — ED Provider Notes (Signed)
 " [facility]  Note:  This document was prepared using Dragon voice recognition software and may include unintentional dictation errors.  MRN: [id number] DOB: 1955/12/14  Subjective:   Ariel [name] is a 68 y.o. female presenting for evaluation of cough and chest congestion x 3 days.  Denies any known sick contacts.  Patient reports that she is currently taking previously prescribed Augmentin and benzonatate  for symptoms with minimal improvement.  Patient concerned with possible worsening infection due to history of B-cell lymphoma and her immunocompromise state.  Patient is set to follow-up with her oncology team on Monday but wanted to make sure she did not have flu or COVID.  No shortness of breath, chest pain, weakness, dizziness, fever.  Current Medications[1]   Allergies[2]  Past Medical History:  Diagnosis Date   Allergy    Arthritis    Cataract    Chicken pox    Complication of anesthesia    Per patient, very slow to wake up after anesthesia   Goals of care, counseling/discussion 12/22/2020   High cholesterol    High grade B-cell lymphoma (HCC) 12/22/2020   Hyperglycemia 11/10/2020   Melanoma (HCC) 2021   right hip   Melanoma (HCC) 10/28/2020   pt stated brain liver and bladder   PONV (postoperative nausea and vomiting)    Stroke [facility])    Urinary incontinence      Past Surgical History:  Procedure Laterality Date   AIR/FLUID EXCHANGE Right 12/07/2020   Procedure: AIR/FLUID EXCHANGE;  Surgeon: Tobie Baptist, MD;  Location: [facility] OR;  Service: Ophthalmology;  Laterality: Right;   APPENDECTOMY  1962   BIOPSY  12/14/2020   Procedure: BIOPSY;  Surgeon: Wilhelmenia Aloha Raddle., MD;  Location: [facility] ENDOSCOPY;  Service: Gastroenterology;;   ESOPHAGOGASTRODUODENOSCOPY (EGD) WITH PROPOFOL  N/A 12/14/2020   Procedure: ESOPHAGOGASTRODUODENOSCOPY (EGD) WITH PROPOFOL ;  Surgeon: Wilhelmenia Aloha Raddle., MD;  Location: [facility] ENDOSCOPY;  Service: Gastroenterology;  Laterality:  N/A;   EXCISION MELANOMA WITH SENTINEL LYMPH NODE BIOPSY Right 10/09/2019   Procedure: WIDE LOCAL EXCISION WITH ADVANCEMENT FLAP CLOSURE RIGHT HIP MELANOMA WITH SENTINEL LYMPH NODE BIOPSY;  Surgeon: Aron Shoulders, MD;  Location: [facility] OR;  Service: General;  Laterality: Right;   EYE SURGERY     FINE NEEDLE ASPIRATION  12/14/2020   Procedure: FINE NEEDLE ASPIRATION (FNA) LINEAR;  Surgeon: Wilhelmenia Aloha Raddle., MD;  Location: [facility] ENDOSCOPY;  Service: Gastroenterology;;   HERNIA REPAIR  2009   IR BONE MARROW BIOPSY & ASPIRATION  05/08/2022   IR IMAGING GUIDED PORT INSERTION  12/30/2020   JOINT REPLACEMENT  2023   MOUTH SURGERY  11/2015   gum surgery and bone implant   PARS PLANA VITRECTOMY Right 12/07/2020   Procedure: PARS PLANA VITRECTOMY WITH 25 GAUGE;  Surgeon: Tobie Baptist, MD;  Location: [facility] OR;  Service: Ophthalmology;  Laterality: Right;   PHOTOCOAGULATION WITH LASER Right 12/07/2020   Procedure: PHOTOCOAGULATION WITH ENDOLASER PANRITINAL COAGULATION;  Surgeon: Tobie Baptist, MD;  Location: [facility] OR;  Service: Ophthalmology;  Laterality: Right;   TOTAL HIP ARTHROPLASTY Left 10/11/2021   Procedure: LEFT TOTAL HIP ARTHROPLASTY ANTERIOR APPROACH;  Surgeon: Sheril Coy, MD;  Location: [facility] ORS;  Service: Orthopedics;  Laterality: Left;   TOTAL HIP ARTHROPLASTY Right 01/10/2022   Procedure: RIGHT TOTAL HIP ARTHROPLASTY ANTERIOR APPROACH;  Surgeon: Sheril Coy, MD;  Location: [facility] ORS;  Service: Orthopedics;  Laterality: Right;   UPPER ESOPHAGEAL ENDOSCOPIC ULTRASOUND (EUS) N/A 12/14/2020   Procedure: UPPER ESOPHAGEAL ENDOSCOPIC ULTRASOUND (EUS);  Surgeon: Wilhelmenia Aloha Raddle., MD;  Location: [facility] ENDOSCOPY;  Service: Gastroenterology;  Laterality: N/A;   WISDOM TOOTH EXTRACTION      Family History  Problem Relation Age of Onset   Coronary artery disease Father        died at 31   COPD Father    Alcohol abuse Father    Diabetes Mellitus II Father    Coronary artery disease Sister     Cancer Sister        breast   Heart attack Brother    Hyperlipidemia Other        died 20- natural causes   Hyperlipidemia Other        4 siblings    Hypertension Other        3 siblings    Social History[3]  ROS Refer to HPI for ROS details.  Objective:    Vitals: BP (!) 155/72 (BP Location: Right Arm)   Pulse 93   Temp 99 F (37.2 C) (Oral)   Resp 17   Ht 5' 6 (1.676 m)   Wt 155 lb (70.3 kg)   SpO2 91%   BMI 25.02 kg/m   Physical Exam Vitals and nursing note reviewed.  Constitutional:      General: She is not in acute distress.    Appearance: Normal appearance. She is well-developed. She is not ill-appearing or toxic-appearing.  HENT:     Head: Normocephalic and atraumatic.     Nose: Congestion present. No rhinorrhea.     Mouth/Throat:     Mouth: Mucous membranes are moist.     Pharynx: Oropharynx is clear.  Cardiovascular:     Rate and Rhythm: Normal rate and regular rhythm.     Heart sounds: Normal heart sounds. No murmur heard. Pulmonary:     Effort: Pulmonary effort is normal. No respiratory distress.     Breath sounds: Normal breath sounds. No stridor. No wheezing.  Chest:     Chest wall: No tenderness.  Abdominal:     Palpations: Abdomen is soft.     Tenderness: There is no abdominal tenderness. There is no right CVA tenderness or left CVA tenderness.  Skin:    General: Skin is warm and dry.  Neurological:     General: No focal deficit present.     Mental Status: She is alert and oriented to person, place, and time.  Psychiatric:        Mood and Affect: Mood normal.        Behavior: Behavior normal.     Procedures  Results for orders placed or performed during the hospital encounter of 02/22/24 (from the past 24 hours)  POCT Influenza A/B     Status: None   Collection Time: 02/22/24  1:20 PM  Result Value Ref Range   Influenza A, POC Negative Negative   Influenza B, POC Negative Negative   *Note: Due to a large number of results  and/or encounters for the requested time period, some results have not been displayed. A complete set of results can be found in Results Review.    Assessment and Plan :     Discharge Instructions       1. Viral URI (Primary) - POCT Influenza A/B completed in UC is negative for influenza A and influenza B - DG Chest 2 View x-ray performed in UC shows no acute cardiopulmonary processes, no sign of consolidation or pneumonia, normal aeration to bilateral lungs. - azelastine  (ASTELIN ) 0.1 % nasal spray; Place 1 spray into both nostrils 2 (two)  times daily. Use in each nostril as directed  Dispense: 30 mL; Refill: 1 - promethazine -dextromethorphan  (PROMETHAZINE -DM) 6.25-15 MG/5ML syrup; Take 10 mLs by mouth 3 (three) times daily as needed.  Dispense: 240 mL; Refill: 0 - Continue take previously prescribed Augmentin twice a day for 7 days to prevent any secondary infection and take benzonatate  for cough suppression with prescribed Promethazine  DM cough syrup.  -Continue to monitor symptoms for any change in severity if there is any escalation of current symptoms or development of new symptoms follow-up in ER for further evaluation and management.       Ariel [name]    [1] No current facility-administered medications for this encounter.  Current Outpatient Medications:    acetaminophen  (TYLENOL ) 500 MG tablet, Take 1,000 mg by mouth every 6 (six) hours as needed for headache, fever, moderate pain (pain score 4-6) or mild pain (pain score 1-3)., Disp: , Rfl:    acyclovir  (ZOVIRAX ) 400 MG tablet, Take 400 mg by mouth 2 (two) times daily., Disp: , Rfl:    albuterol  (VENTOLIN  HFA) 108 (90 Base) MCG/ACT inhaler, Inhale 2 puffs into the lungs every 6 (six) hours as needed for wheezing or shortness of breath., Disp: 8 g, Rfl: 2   atovaquone (MEPRON) 750 MG/5ML suspension, Take 1,500 mg by mouth., Disp: , Rfl:    azelastine  (ASTELIN ) 0.1 % nasal spray, Place 1 spray into both nostrils 2  (two) times daily. Use in each nostril as directed, Disp: 30 mL, Rfl: 1   benzonatate  (TESSALON ) 100 MG capsule, Take 1 capsule (100 mg total) by mouth 2 (two) times daily as needed for cough., Disp: 20 capsule, Rfl: 0   dapsone 100 MG tablet, Take 100 mg by mouth daily., Disp: , Rfl:    ondansetron  (ZOFRAN -ODT) 8 MG disintegrating tablet, Take 1 tablet (8 mg total) by mouth every 8 (eight) hours as needed for nausea or vomiting., Disp: 30 tablet, Rfl: 1   prochlorperazine  (COMPAZINE ) 10 MG tablet, Take 1 tablet (10 mg total) by mouth every 6 (six) hours as needed for nausea or vomiting., Disp: 30 tablet, Rfl: 1   promethazine -dextromethorphan  (PROMETHAZINE -DM) 6.25-15 MG/5ML syrup, Take 10 mLs by mouth 3 (three) times daily as needed., Disp: 240 mL, Rfl: 0   azithromycin  (ZITHROMAX ) 250 MG tablet, Take 2 tablets on day 1, then 1 tablet daily on days 2 through 5, Disp: 6 tablet, Rfl: 0  Facility-Administered Medications Ordered in Other Encounters:    sodium chloride  flush (NS) 0.9 % injection 10 mL, 10 mL, Intravenous, PRN, Ennever, Peter R, MD, 10 mL at 10/04/21 0946 [2]  Allergies Allergen Reactions   Decadron  [Dexamethasone ] Other (See Comments)    Leg weakness Hallucinations   Plavix  [Clopidogrel ] Hives, Swelling and Other (See Comments)    Lips became swollen   Prednisone Other (See Comments)    Steroids caused psychological issues  Hallucinations   Sulfa  Antibiotics Itching and Rash   Codeine Nausea And Vomiting  [3]  Social History Tobacco Use   Smoking status: Never   Smokeless tobacco: Never  Vaping Use   Vaping status: Never Used  Substance Use Topics   Alcohol use: Never   Drug use: Never     Aurea Goodell B, NP 02/22/24 1412  "

## 2024-02-22 NOTE — ED Triage Notes (Signed)
 Pt states that she has a cough and chest congestion. X3 days

## 2024-02-22 NOTE — Telephone Encounter (Signed)
 FYI Only or Action Required?: FYI only for provider: UC advised.  Patient was last seen in primary care on 02/15/2024 by Daryl Setter, NP.  Called Nurse Triage reporting Cough.  Symptoms began several weeks ago.  Interventions attempted: Prescription medications: Azithromycin ; tessalon  and Rest, hydration, or home remedies.  Symptoms are: gradually worsening.  Triage Disposition: See HCP Within 4 Hours (Or PCP Triage)  Patient/caregiver understands and will follow disposition?: Yes    Copied from CRM #[id number]. Topic: Clinical - Red Word Triage >> Feb 22, 2024  9:37 AM China J wrote: [facility] that prompted transfer to Nurse Triage: The patient is complaining of a worsening cough that has deepened. She is wanting a chest xray if possible as she thinks it might be pneumonia.   Reason for Disposition  Wheezing is present  Answer Assessment - Initial Assessment Questions Pt called in requesting appt for chest xray to r/o pneumonia. Pt reports cough x1 week that is worsening despite being on medications. Pt denies fever, SOB or chest pain. Pt's voice audibly deeper and hoarse. Discussed no appts in office today and importance of going to UC for xray. Pt to go to UC on [address]. Reassured her I would update PCP.    1. ONSET: When did the cough begin?      Over 1 week   2. SEVERITY: How bad is the cough today?      Worsening; reports cough feels deep in chest and she has developed wheezing   5. DIFFICULTY BREATHING: Are you having difficulty breathing? If Yes, ask: How bad is it? (e.g., mild, moderate, severe)      None   6. FEVER: Do you have a fever? If Yes, ask: What is your temperature, how was it measured, and when did it start?     None   10. OTHER SYMPTOMS: Do you have any other symptoms? (e.g., runny nose, wheezing, chest pain)       wheezing  Protocols used: Cough - Acute Non-Productive-A-AH

## 2024-02-22 NOTE — Discharge Instructions (Addendum)
" °  1. Viral URI (Primary) - POCT Influenza A/B completed in UC is negative for influenza A and influenza B - DG Chest 2 View x-ray performed in UC shows no acute cardiopulmonary processes, no sign of consolidation or pneumonia, normal aeration to bilateral lungs. - azelastine  (ASTELIN ) 0.1 % nasal spray; Place 1 spray into both nostrils 2 (two) times daily. Use in each nostril as directed  Dispense: 30 mL; Refill: 1 - promethazine -dextromethorphan  (PROMETHAZINE -DM) 6.25-15 MG/5ML syrup; Take 10 mLs by mouth 3 (three) times daily as needed.  Dispense: 240 mL; Refill: 0 - Continue take previously prescribed Augmentin twice a day for 7 days to prevent any secondary infection and take benzonatate  for cough suppression with prescribed Promethazine  DM cough syrup.  -Continue to monitor symptoms for any change in severity if there is any escalation of current symptoms or development of new symptoms follow-up in ER for further evaluation and management. "

## 2024-02-22 NOTE — Telephone Encounter (Signed)
 Pt went to urgent care.

## 2024-02-26 NOTE — Progress Notes (Unsigned)
 [facility] [address] [place], [place] [address] [phone]  Date:  02/27/2024   Name:  Ariel [name]   DOB:  09/29/1955   MRN:  [id number]  PCP:  Daryl Setter, NP    Chief Complaint: No chief complaint on file.   History of Present Illness:  Ariel [name] is a 68 y.o. very pleasant female patient who presents with the following:  Primary patient of my partner Setter Daryl seen today with concern of illness I have not seen him myself previously She was seen in urgent care on 12/26 with concern of likely viral URI-at that time she had cough and chest congestion for 3 days.  She was taking some leftover Augmentin but it did not seem to be helping She had a flu test and chest x-ray, all reassuring and was prescribed Astelin , Promethazine  DM syrup and advised to go ahead and finish out Augmentin twice daily for 7 days - Of note, it looks like Melissa actually send in a prescription for azithromycin  for the patient on 12/23  She has a history of CNS lymphoma, this is being managed by [facility] oncology and she is immune compromised.  I believe she had a bone marrow transplant as well as CAR-T immunotherapy   Discussed the use of AI scribe software for clinical note transcription with the patient, who gave verbal consent to proceed.  History of Present Illness    Patient Active Problem List   Diagnosis Date Noted   Preventative health care 02/15/2024   Viral URI 02/15/2024   Lymphoma malignant, large cell (HCC) 11/27/2022   UTI (urinary tract infection) 10/30/2022   Ataxia 10/29/2022   Leukocytosis 04/14/2022   Thrombocytosis, unspecified 04/14/2022   Epistaxis 04/08/2022   COVID-19 virus infection 04/05/2022   Hypokalemia 04/05/2022   Hypophosphatemia 04/05/2022   SIADH (syndrome of inappropriate ADH production) 04/05/2022   Protein-calorie malnutrition, moderate 03/28/2022   Palliative care by  specialist 03/23/2022   Aphasia 03/17/2022   CNS lymphoma (HCC) 03/10/2022   Primary localized osteoarthritis of right hip 01/10/2022   Primary localized osteoarthritis of left hip 10/11/2021   Preoperative clearance 09/13/2021   Encounter for screening mammogram for malignant neoplasm of breast 09/13/2021   Pancytopenia (HCC) 05/16/2021   Lymphoma malignant, 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   Diffuse large B cell lymphoma (HCC) 03/07/2021   High grade B-cell lymphoma (HCC) 12/22/2020   Goals of care, counseling/discussion 12/22/2020   Malignant neoplasm metastatic to brain (HCC) 12/09/2020   Brain tumor (HCC) 12/08/2020   Night sweat 11/30/2020   Complication of anesthesia 11/22/2020   PONV (postoperative nausea and vomiting) 11/22/2020   Hyperglycemia 11/10/2020   TIA (transient ischemic attack) 10/07/2020   Iron deficiency anemia 10/07/2020   Melanoma (HCC) 2021   Multinodular goiter 12/22/2013   Subclinical hyperthyroidism 12/19/2013   Urinary incontinence 11/19/2013   Hyperlipidemia 11/19/2012   Routine general medical examination at a health care facility 11/18/2012    Past Medical History:  Diagnosis Date   Allergy    Arthritis    Cataract    Chicken pox    Complication of anesthesia    Per patient, very slow to wake up after anesthesia   Goals of care, counseling/discussion 12/22/2020   High cholesterol    High grade B-cell lymphoma (HCC) 12/22/2020   Hyperglycemia 11/10/2020   Melanoma (HCC) 2021   right hip   Melanoma (HCC) 10/28/2020   pt stated brain liver  and bladder   PONV (postoperative nausea and vomiting)    Stroke [facility])    Urinary incontinence     Past Surgical History:  Procedure Laterality Date   AIR/FLUID EXCHANGE Right 12/07/2020   Procedure: AIR/FLUID EXCHANGE;  Surgeon: Tobie Baptist, MD;  Location: [facility] OR;  Service: Ophthalmology;  Laterality: Right;   APPENDECTOMY  1962   BIOPSY  12/14/2020   Procedure: BIOPSY;  Surgeon: Wilhelmenia  Aloha Raddle., MD;  Location: [facility] ENDOSCOPY;  Service: Gastroenterology;;   ESOPHAGOGASTRODUODENOSCOPY (EGD) WITH PROPOFOL  N/A 12/14/2020   Procedure: ESOPHAGOGASTRODUODENOSCOPY (EGD) WITH PROPOFOL ;  Surgeon: Wilhelmenia Aloha Raddle., MD;  Location: [facility] ENDOSCOPY;  Service: Gastroenterology;  Laterality: N/A;   EXCISION MELANOMA WITH SENTINEL LYMPH NODE BIOPSY Right 10/09/2019   Procedure: WIDE LOCAL EXCISION WITH ADVANCEMENT FLAP CLOSURE RIGHT HIP MELANOMA WITH SENTINEL LYMPH NODE BIOPSY;  Surgeon: Aron Shoulders, MD;  Location: [facility] OR;  Service: General;  Laterality: Right;   EYE SURGERY     FINE NEEDLE ASPIRATION  12/14/2020   Procedure: FINE NEEDLE ASPIRATION (FNA) LINEAR;  Surgeon: Wilhelmenia Aloha Raddle., MD;  Location: [facility] ENDOSCOPY;  Service: Gastroenterology;;   HERNIA REPAIR  2009   IR BONE MARROW BIOPSY & ASPIRATION  05/08/2022   IR IMAGING GUIDED PORT INSERTION  12/30/2020   JOINT REPLACEMENT  2023   MOUTH SURGERY  11/2015   gum surgery and bone implant   PARS PLANA VITRECTOMY Right 12/07/2020   Procedure: PARS PLANA VITRECTOMY WITH 25 GAUGE;  Surgeon: Tobie Baptist, MD;  Location: [facility] OR;  Service: Ophthalmology;  Laterality: Right;   PHOTOCOAGULATION WITH LASER Right 12/07/2020   Procedure: PHOTOCOAGULATION WITH ENDOLASER PANRITINAL COAGULATION;  Surgeon: Tobie Baptist, MD;  Location[facility] OR;  Service: Ophthalmology;  Laterality: Right;   TOTAL HIP ARTHROPLASTY Left 10/11/2021   Procedure: LEFT TOTAL HIP ARTHROPLASTY ANTERIOR APPROACH;  Surgeon: Sheril Coy, MD;  Location: [facility] ORS;  Service: Orthopedics;  Laterality: Left;   TOTAL HIP ARTHROPLASTY Right 01/10/2022   Procedure: RIGHT TOTAL HIP ARTHROPLASTY ANTERIOR APPROACH;  Surgeon: Sheril Coy, MD;  Location: [facility] ORS;  Service: Orthopedics;  Laterality: Right;   UPPER ESOPHAGEAL ENDOSCOPIC ULTRASOUND (EUS) N/A 12/14/2020   Procedure: UPPER ESOPHAGEAL ENDOSCOPIC ULTRASOUND (EUS);  Surgeon: Wilhelmenia Aloha Raddle., MD;  Location:  [facility] ENDOSCOPY;  Service: Gastroenterology;  Laterality: N/A;   WISDOM TOOTH EXTRACTION      Social History[1]  Family History  Problem Relation Age of Onset   Coronary artery disease Father        died at 49   COPD Father    Alcohol abuse Father    Diabetes Mellitus II Father    Coronary artery disease Sister    Cancer Sister        breast   Heart attack Brother    Hyperlipidemia Other        died 40- natural causes   Hyperlipidemia Other        4 siblings    Hypertension Other        3 siblings    Allergies[2]  Medication list has been reviewed and updated.  Medications Ordered Prior to Encounter[3]  Review of Systems:  As per HPI- otherwise negative.   Physical Examination: There were no vitals filed for this visit. There were no vitals filed for this visit. There is no height or weight on file to calculate BMI. Ideal Body Weight:    GEN: no acute distress. HEENT: Atraumatic, Normocephalic.  Ears and Nose: No external deformity. CV: RRR, No M/G/R. No  JVD. No thrill. No extra heart sounds. PULM: CTA B, no wheezes, crackles, rhonchi. No retractions. No resp. distress. No accessory muscle use. ABD: S, NT, ND, +BS. No rebound. No HSM. EXTR: No c/c/e PSYCH: Normally interactive. Conversant.    Assessment and Plan: No diagnosis found.  Assessment & Plan   Signed Harlene Schroeder, MD    [1]  Social History Tobacco Use   Smoking status: Never   Smokeless tobacco: Never  Vaping Use   Vaping status: Never Used  Substance Use Topics   Alcohol use: Never   Drug use: Never  [2]  Allergies Allergen Reactions   Decadron  [Dexamethasone ] Other (See Comments)    Leg weakness Hallucinations   Plavix  [Clopidogrel ] Hives, Swelling and Other (See Comments)    Lips became swollen   Prednisone Other (See Comments)    Steroids caused psychological issues  Hallucinations   Sulfa  Antibiotics Itching and Rash   Codeine Nausea And Vomiting  [3]  Current  Outpatient Medications on File Prior to Visit  Medication Sig Dispense Refill   acetaminophen  (TYLENOL ) 500 MG tablet Take 1,000 mg by mouth every 6 (six) hours as needed for headache, fever, moderate pain (pain score 4-6) or mild pain (pain score 1-3).     acyclovir  (ZOVIRAX ) 400 MG tablet Take 400 mg by mouth 2 (two) times daily.     albuterol  (VENTOLIN  HFA) 108 (90 Base) MCG/ACT inhaler Inhale 2 puffs into the lungs every 6 (six) hours as needed for wheezing or shortness of breath. 8 g 2   atovaquone (MEPRON) 750 MG/5ML suspension Take 1,500 mg by mouth.     azelastine  (ASTELIN ) 0.1 % nasal spray Place 1 spray into both nostrils 2 (two) times daily. Use in each nostril as directed 30 mL 1   benzonatate  (TESSALON ) 100 MG capsule Take 1 capsule (100 mg total) by mouth 2 (two) times daily as needed for cough. 20 capsule 0   dapsone 100 MG tablet Take 100 mg by mouth daily.     ondansetron  (ZOFRAN -ODT) 8 MG disintegrating tablet Take 1 tablet (8 mg total) by mouth every 8 (eight) hours as needed for nausea or vomiting. 30 tablet 1   prochlorperazine  (COMPAZINE ) 10 MG tablet Take 1 tablet (10 mg total) by mouth every 6 (six) hours as needed for nausea or vomiting. 30 tablet 1   promethazine -dextromethorphan  (PROMETHAZINE -DM) 6.25-15 MG/5ML syrup Take 10 mLs by mouth 3 (three) times daily as needed. 240 mL 0   Current Facility-Administered Medications on File Prior to Visit  Medication Dose Route Frequency Provider Last Rate Last Admin   sodium chloride  flush (NS) 0.9 % injection 10 mL  10 mL Intravenous PRN Ennever, Peter R, MD   10 mL at 10/04/21 0946   "

## 2024-02-27 ENCOUNTER — Encounter: Payer: Self-pay | Admitting: Family Medicine

## 2024-02-27 ENCOUNTER — Ambulatory Visit: Admitting: Family Medicine

## 2024-02-27 ENCOUNTER — Ambulatory Visit (HOSPITAL_BASED_OUTPATIENT_CLINIC_OR_DEPARTMENT_OTHER)
Admission: RE | Admit: 2024-02-27 | Discharge: 2024-02-27 | Disposition: A | Discharge status: Home / Self Care | Source: Ambulatory Visit | Attending: Family Medicine | Admitting: Family Medicine

## 2024-02-27 VITALS — BP 140/68 | HR 90 | Temp 98.9°F | Ht 66.0 in | Wt 168.4 lb

## 2024-02-27 DIAGNOSIS — J189 Pneumonia, unspecified organism: Secondary | ICD-10-CM

## 2024-02-27 DIAGNOSIS — R509 Fever, unspecified: Secondary | ICD-10-CM

## 2024-02-27 LAB — CBC WITH DIFFERENTIAL/PLATELET
Basophils Absolute: 0 K/uL (ref 0.0–0.1)
Basophils Relative: 0.3 % (ref 0.0–3.0)
Eosinophils Absolute: 0.1 K/uL (ref 0.0–0.7)
Eosinophils Relative: 3.1 % (ref 0.0–5.0)
HCT: 27.9 % — ABNORMAL LOW (ref 36.0–46.0)
Hemoglobin: 9.3 g/dL — ABNORMAL LOW (ref 12.0–15.0)
Lymphocytes Relative: 15.3 % (ref 12.0–46.0)
Lymphs Abs: 0.6 K/uL — ABNORMAL LOW (ref 0.7–4.0)
MCHC: 33.4 g/dL (ref 30.0–36.0)
MCV: 96.1 fl (ref 78.0–100.0)
Monocytes Absolute: 0.6 K/uL (ref 0.1–1.0)
Monocytes Relative: 15.1 % — ABNORMAL HIGH (ref 3.0–12.0)
Neutro Abs: 2.8 K/uL (ref 1.4–7.7)
Neutrophils Relative %: 66.2 % (ref 43.0–77.0)
Platelets: 157 K/uL (ref 150.0–400.0)
RBC: 2.9 Mil/uL — ABNORMAL LOW (ref 3.87–5.11)
RDW: 15.1 % (ref 11.5–15.5)
WBC: 4.2 K/uL (ref 4.0–10.5)

## 2024-02-27 LAB — BASIC METABOLIC PANEL WITH GFR
BUN: 13 mg/dL (ref 6–23)
CO2: 29 meq/L (ref 19–32)
Calcium: 9.5 mg/dL (ref 8.4–10.5)
Chloride: 98 meq/L (ref 96–112)
Creatinine, Ser: 0.6 mg/dL (ref 0.40–1.20)
GFR: 91.3 mL/min
Glucose, Bld: 118 mg/dL — ABNORMAL HIGH (ref 70–99)
Potassium: 3.7 meq/L (ref 3.5–5.1)
Sodium: 136 meq/L (ref 135–145)

## 2024-02-27 LAB — POCT INFLUENZA A/B
Influenza A, POC: NEGATIVE
Influenza B, POC: NEGATIVE

## 2024-02-27 LAB — POC COVID19 BINAXNOW: SARS Coronavirus 2 Ag: NEGATIVE

## 2024-02-27 MED ORDER — AZITHROMYCIN 250 MG PO TABS: ORAL_TABLET | ORAL | 0 refills | Status: AC

## 2024-02-27 MED ORDER — AMOXICILLIN 500 MG PO CAPS: 1000.0000 mg | ORAL_CAPSULE | Freq: Three times a day (TID) | ORAL | 0 refills | Status: DC

## 2024-02-27 NOTE — Patient Instructions (Signed)
 Please go to lab and then to x-ray on the ground floor.  I will be in touch with results later today!

## 2024-03-03 ENCOUNTER — Other Ambulatory Visit: Payer: Self-pay | Admitting: Family Medicine

## 2024-03-03 DIAGNOSIS — C8580 Other specified types of non-Hodgkin lymphoma, unspecified site: Secondary | ICD-10-CM

## 2024-03-04 LAB — CULTURE, BLOOD (SINGLE)
MICRO NUMBER:: 17415151
Result:: NO GROWTH
SPECIMEN QUALITY:: ADEQUATE

## 2024-03-04 NOTE — Progress Notes (Signed)
 Designer, [employer] at [employer] [address] [place], [place] [address] [phone]  Date:  03/06/2024   Name:  Ariel [name]   DOB:  1955-04-07   MRN:  [id number]  PCP:  Daryl Setter, NP    Chief Complaint: Follow-up   History of Present Illness:  Ariel [name] is a 69 y.o. very pleasant female patient who presents with the following:  Patient seen today for follow-up.  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 She had actually already been taking Levaquin  which was prescribed by her hematology team Blood work showed stable white counts and adequate neutrophils Chest x-ray did show likely pneumonia, we added amoxicillin  and azithromycin . In the meantime her blood cultures came back negative  Discussed the use of AI scribe software for clinical note transcription with the patient, who gave verbal consent to proceed.  History of Present Illness Ariel [name] is a 69 year old female with a significant cancer history who presents with a persistent cough.  She has been experiencing a deep cough for over a week, which has improved but is still present. She has been using Testoprol and Mucinex  for relief, with the latter providing some help. No wheezing is present. No shortness of breath has been noted, although she has not exerted herself much recently.  She has already completed her azithromycin  and still has a couple of days of amoxicillin  left. Despite the cough, she feels she is making progress in her recovery.  We have been carefully watching her blood counts due to history of immunosuppression and lymphoma No fevers, but she reports experiencing night sweats,  which she attributes to getting hot at night. No vomiting, and she has been able to eat and drink, although her appetite is reduced. She makes an effort to eat for strength.  She has been taking a baby aspirin  regularly but is not on any other blood thinners.    Patient Active Problem List   Diagnosis Date Noted   Preventative health care 02/15/2024   Viral URI 02/15/2024   Lymphoma malignant, large cell (HCC) 11/27/2022   UTI (urinary tract infection) 10/30/2022   Ataxia 10/29/2022   Leukocytosis 04/14/2022   Thrombocytosis, unspecified 04/14/2022   Epistaxis 04/08/2022   COVID-19 virus infection 04/05/2022   Hypokalemia 04/05/2022   Hypophosphatemia 04/05/2022   SIADH (syndrome of inappropriate ADH production) 04/05/2022   Protein-calorie malnutrition, moderate 03/28/2022   Palliative care by specialist 03/23/2022   Aphasia 03/17/2022   CNS lymphoma (HCC) 03/10/2022   Primary localized osteoarthritis of right hip 01/10/2022   Primary localized osteoarthritis of left hip 10/11/2021   Preoperative clearance 09/13/2021   Encounter for screening mammogram for malignant neoplasm of breast 09/13/2021   Pancytopenia (HCC) 05/16/2021   Lymphoma malignant, 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   Diffuse large B cell lymphoma (HCC) 03/07/2021   High grade B-cell lymphoma (HCC) 12/22/2020   Goals of care, counseling/discussion 12/22/2020   Malignant neoplasm metastatic to brain (HCC) 12/09/2020   Brain tumor (HCC) 12/08/2020   Night sweat 11/30/2020   Complication of anesthesia 11/22/2020   PONV (postoperative nausea and vomiting) 11/22/2020  Hyperglycemia 11/10/2020   TIA (transient ischemic attack) 10/07/2020   Iron deficiency anemia 10/07/2020   Melanoma (HCC) 2021   Multinodular goiter 12/22/2013   Subclinical hyperthyroidism 12/19/2013   Urinary incontinence 11/19/2013   Hyperlipidemia 11/19/2012   Routine general medical examination at a health care facility 11/18/2012    Past  Medical History:  Diagnosis Date   Allergy    Arthritis    Cataract    Chicken pox    Complication of anesthesia    Per patient, very slow to wake up after anesthesia   Goals of care, counseling/discussion 12/22/2020   High cholesterol    High grade B-cell lymphoma (HCC) 12/22/2020   Hyperglycemia 11/10/2020   Melanoma (HCC) 2021   right hip   Melanoma (HCC) 10/28/2020   pt stated brain liver and bladder   PONV (postoperative nausea and vomiting)    Stroke [facility])    Urinary incontinence     Past Surgical History:  Procedure Laterality Date   AIR/FLUID EXCHANGE Right 12/07/2020   Procedure: AIR/FLUID EXCHANGE;  Surgeon: Tobie Baptist, MD;  Location: [facility] OR;  Service: Ophthalmology;  Laterality: Right;   APPENDECTOMY  1962   BIOPSY  12/14/2020   Procedure: BIOPSY;  Surgeon: Wilhelmenia Aloha Raddle., MD;  Location: [facility] ENDOSCOPY;  Service: Gastroenterology;;   ESOPHAGOGASTRODUODENOSCOPY (EGD) WITH PROPOFOL  N/A 12/14/2020   Procedure: ESOPHAGOGASTRODUODENOSCOPY (EGD) WITH PROPOFOL ;  Surgeon: Wilhelmenia Aloha Raddle., MD;  Location: [facility] ENDOSCOPY;  Service: Gastroenterology;  Laterality: N/A;   EXCISION MELANOMA WITH SENTINEL LYMPH NODE BIOPSY Right 10/09/2019   Procedure: WIDE LOCAL EXCISION WITH ADVANCEMENT FLAP CLOSURE RIGHT HIP MELANOMA WITH SENTINEL LYMPH NODE BIOPSY;  Surgeon: Aron Shoulders, MD;  Location: [facility] OR;  Service: General;  Laterality: Right;   EYE SURGERY     FINE NEEDLE ASPIRATION  12/14/2020   Procedure: FINE NEEDLE ASPIRATION (FNA) LINEAR;  Surgeon: Wilhelmenia Aloha Raddle., MD;  Location: [facility] ENDOSCOPY;  Service: Gastroenterology;;   HERNIA REPAIR  2009   IR BONE MARROW BIOPSY & ASPIRATION  05/08/2022   IR IMAGING GUIDED PORT INSERTION  12/30/2020   JOINT REPLACEMENT  2023   MOUTH SURGERY  11/2015   gum surgery and bone implant   PARS PLANA VITRECTOMY Right 12/07/2020   Procedure: PARS PLANA VITRECTOMY WITH 25 GAUGE;  Surgeon: Tobie Baptist, MD;  Location: [facility] OR;   Service: Ophthalmology;  Laterality: Right;   PHOTOCOAGULATION WITH LASER Right 12/07/2020   Procedure: PHOTOCOAGULATION WITH ENDOLASER PANRITINAL COAGULATION;  Surgeon: Tobie Baptist, MD;  Location: [facility] OR;  Service: Ophthalmology;  Laterality: Right;   TOTAL HIP ARTHROPLASTY Left 10/11/2021   Procedure: LEFT TOTAL HIP ARTHROPLASTY ANTERIOR APPROACH;  Surgeon: Sheril Coy, MD;  Location: [facility] ORS;  Service: Orthopedics;  Laterality: Left;   TOTAL HIP ARTHROPLASTY Right 01/10/2022   Procedure: RIGHT TOTAL HIP ARTHROPLASTY ANTERIOR APPROACH;  Surgeon: Sheril Coy, MD;  Location: [facility] ORS;  Service: Orthopedics;  Laterality: Right;   UPPER ESOPHAGEAL ENDOSCOPIC ULTRASOUND (EUS) N/A 12/14/2020   Procedure: UPPER ESOPHAGEAL ENDOSCOPIC ULTRASOUND (EUS);  Surgeon: Wilhelmenia Aloha Raddle., MD;  Location: [facility] ENDOSCOPY;  Service: Gastroenterology;  Laterality: N/A;   WISDOM TOOTH EXTRACTION      Social History[1]  Family History  Problem Relation Age of Onset   Coronary artery disease Father        died at 83   COPD Father    Alcohol abuse Father    Diabetes Mellitus II Father    Coronary artery disease Sister    Cancer Sister  breast   Heart attack Brother    Hyperlipidemia Other        died 10- natural causes   Hyperlipidemia Other        4 siblings    Hypertension Other        3 siblings    Allergies[2]  Medication list has been reviewed and updated.  Medications Ordered Prior to Encounter[3]  Review of Systems:  As per HPI- otherwise negative.   Physical Examination: Vitals:   03/06/24 1010  BP: 120/64  Pulse: (!) 115  SpO2: 90%   Vitals:   03/06/24 1010  Weight: 164 lb 6.4 oz (74.6 kg)  Height: 5' 6 (1.676 m)   Body mass index is 26.53 kg/m. Ideal Body Weight: Weight in (lb) to have BMI = 25: 154.6  GEN: no acute distress.  Cancer patient, nontoxic HEENT: Atraumatic, Normocephalic.  Ears and Nose: No external deformity. CV: RRR, No M/G/R. No  JVD. No thrill. No extra heart sounds. PULM: CTA B, no wheezes, crackles, rhonchi. No retractions. No resp. distress. No accessory muscle use. ABD: S, NT, ND EXTR: No c/c/e PSYCH: Normally interactive. Conversant.   Tried twice more but could not get her sats over 90%  Assessment and Plan: Community acquired pneumonia, unspecified laterality - Plan: CBC with Differential/Platelet, amoxicillin  (AMOXIL ) 500 MG capsule  Hypoxemia - Plan: CT Angio Chest W/Cm &/Or Wo Cm  Assessment & Plan Community acquired pneumonia Persistent cough with rales, oxygen saturation 90%. Differential includes continued symptoms of pneumonia and PE due to cancer history. No fever, night sweats present. Reports improvement, but weakness persists. - CT angiogram to rule out PE - Checked blood counts to monitor for anemia and leukopenia. - Continue Tessalon  and Mucinex  for cough management.  Anemia Likely contributing to weakness. - Checked blood counts to assess anemia status.  Malignant neoplasm Non-Hodgkin lymphoma and CNS lymphoma post CAR T-cell therapy. Increased risk for blood clots due to cancer history. No current anticoagulation therapy, only baby aspirin . CT angiogram  Signed Harlene Schroeder, MD  Received her labs and CT- gave her a call Good news, blood counts are stable to improved and she does not have a pulmonary embolism.  I will order a repeat CT chest in about 6 weeks to make sure this pneumonia clears up.  We will also extend her amoxicillin  for another 3 days to equal 10 days total She will keep me posted on how she is doing-if she does not continue to improve please alert me  Results for orders placed or performed in visit on 03/06/24  CBC with Differential/Platelet   Collection Time: 03/06/24 10:45 AM  Result Value Ref Range   WBC 5.1 4.0 - 10.5 K/uL   RBC 3.24 (L) 3.87 - 5.11 Mil/uL   Hemoglobin 10.2 (L) 12.0 - 15.0 g/dL   HCT 68.3 (L) 63.9 - 53.9 %   MCV 97.4 78.0 - 100.0 fl    MCHC 32.2 30.0 - 36.0 g/dL   RDW 82.6 (H) 88.4 - 84.4 %   Platelets 195.0 150.0 - 400.0 K/uL   Neutrophils Relative % 58.1 43.0 - 77.0 %   Lymphocytes Relative 28.0 12.0 - 46.0 %   Monocytes Relative 11.6 3.0 - 12.0 %   Eosinophils Relative 1.9 0.0 - 5.0 %   Basophils Relative 0.4 0.0 - 3.0 %   Neutro Abs 2.9 1.4 - 7.7 K/uL   Lymphs Abs 1.4 0.7 - 4.0 K/uL   Monocytes Absolute 0.6 0.1 - 1.0 K/uL  Eosinophils Absolute 0.1 0.0 - 0.7 K/uL   Basophils Absolute 0.0 0.0 - 0.1 K/uL   *Note: Due to a large number of results and/or encounters for the requested time period, some results have not been displayed. A complete set of results can be found in Results Review.   CT Angio Chest W/Cm &/Or Wo Cm Result Date: 03/06/2024 CLINICAL DATA:  Hypoxemia. Sick for 2 weeks. Diagnosed with pneumonia last week. Active lymphoma. Evaluate for pulmonary embolism. EXAM: CT ANGIOGRAPHY CHEST WITH CONTRAST TECHNIQUE: Multidetector CT imaging of the chest was performed using the standard protocol during bolus administration of intravenous contrast. Multiplanar CT image reconstructions and MIPs were obtained to evaluate the vascular anatomy. RADIATION DOSE REDUCTION: This exam was performed according to the departmental dose-optimization program which includes automated exposure control, adjustment of the mA and/or kV according to patient size and/or use of iterative reconstruction technique. CONTRAST:  75mL OMNIPAQUE  IOHEXOL  350 MG/ML SOLN COMPARISON:  CT 02/21/2022, chest x-ray 02/22/2024 and 02/27/2024 FINDINGS: Cardiovascular: Right IJ Port-A-Cath has tip over the cavoatrial junction. Heart is normal size. Mild calcified plaque over the left anterior descending coronary artery. Thoracic aorta is normal in caliber. Pulmonary arterial system is well opacified without evidence of emboli. Remaining vascular structures are unremarkable. Mediastinum/Nodes: No mediastinal or hilar adenopathy. Remaining mediastinal structures are  unremarkable. Lungs/Pleura: Lungs are adequately inflated. There is moderate consolidation over the inferior aspect of the right upper lobe abutting the minor fissure. Subtle patchy airspace density over the left upper lobe and minimally left lower lobe. These findings likely due to multifocal infection. No effusion. Airways are unremarkable. Upper Abdomen: Images through the upper abdomen demonstrate calcified plaque over the visualized abdominal aorta. No acute findings. Musculoskeletal: No focal abnormality. Review of the MIP images confirms the above findings. IMPRESSION: 1. No evidence of pulmonary embolism. 2. Persistent consolidation over the inferior aspect of the right upper lobe. Subtle patchy airspace density over the left upper lobe and minimally left lower lobe. Findings likely due to multifocal pneumonia. Recommend follow-up chest CT 4-6 weeks to document resolution. 3. Aortic atherosclerosis. Atherosclerotic coronary artery disease. Aortic Atherosclerosis (ICD10-I70.0). Electronically Signed   By: Toribio Agreste M.D.   On: 03/06/2024 13:57        [1]  Social History Tobacco Use   Smoking status: Never   Smokeless tobacco: Never  Vaping Use   Vaping status: Never Used  Substance Use Topics   Alcohol use: Never   Drug use: Never  [2]  Allergies Allergen Reactions   Decadron  [Dexamethasone ] Other (See Comments)    Leg weakness Hallucinations   Plavix  [Clopidogrel ] Hives, Swelling and Other (See Comments)    Lips became swollen   Prednisone Other (See Comments)    Steroids caused psychological issues  Hallucinations   Sulfa  Antibiotics Itching and Rash   Codeine Nausea And Vomiting  [3]  Current Outpatient Medications on File Prior to Visit  Medication Sig Dispense Refill   acetaminophen  (TYLENOL ) 500 MG tablet Take 1,000 mg by mouth every 6 (six) hours as needed for headache, fever, moderate pain (pain score 4-6) or mild pain (pain score 1-3).     acyclovir  (ZOVIRAX ) 400  MG tablet Take 400 mg by mouth 2 (two) times daily.     albuterol  (VENTOLIN  HFA) 108 (90 Base) MCG/ACT inhaler Inhale 2 puffs into the lungs every 6 (six) hours as needed for wheezing or shortness of breath. 8 g 2   atovaquone (MEPRON) 750 MG/5ML suspension Take 1,500 mg by mouth.  azelastine  (ASTELIN ) 0.1 % nasal spray Place 1 spray into both nostrils 2 (two) times daily. Use in each nostril as directed 30 mL 1   benzonatate  (TESSALON ) 100 MG capsule Take 1 capsule (100 mg total) by mouth 2 (two) times daily as needed for cough. 20 capsule 0   dapsone 100 MG tablet Take 100 mg by mouth daily.     ondansetron  (ZOFRAN -ODT) 8 MG disintegrating tablet Take 1 tablet (8 mg total) by mouth every 8 (eight) hours as needed for nausea or vomiting. 30 tablet 1   prochlorperazine  (COMPAZINE ) 10 MG tablet Take 1 tablet (10 mg total) by mouth every 6 (six) hours as needed for nausea or vomiting. 30 tablet 1   promethazine -dextromethorphan  (PROMETHAZINE -DM) 6.25-15 MG/5ML syrup Take 10 mLs by mouth 3 (three) times daily as needed. 240 mL 0   Current Facility-Administered Medications on File Prior to Visit  Medication Dose Route Frequency Provider Last Rate Last Admin   sodium chloride  flush (NS) 0.9 % injection 10 mL  10 mL Intravenous PRN Ennever, Peter R, MD   10 mL at 10/04/21 0946   "

## 2024-03-06 ENCOUNTER — Ambulatory Visit (HOSPITAL_BASED_OUTPATIENT_CLINIC_OR_DEPARTMENT_OTHER)
Admission: RE | Admit: 2024-03-06 | Discharge: 2024-03-06 | Disposition: A | Discharge status: Home / Self Care | Source: Ambulatory Visit | Attending: Family Medicine | Admitting: Family Medicine

## 2024-03-06 ENCOUNTER — Encounter (HOSPITAL_BASED_OUTPATIENT_CLINIC_OR_DEPARTMENT_OTHER): Payer: Self-pay

## 2024-03-06 ENCOUNTER — Ambulatory Visit (INDEPENDENT_AMBULATORY_CARE_PROVIDER_SITE_OTHER): Admitting: Family Medicine

## 2024-03-06 ENCOUNTER — Encounter: Payer: Self-pay | Admitting: Hematology & Oncology

## 2024-03-06 ENCOUNTER — Encounter: Payer: Self-pay | Admitting: Family Medicine

## 2024-03-06 VITALS — BP 120/64 | HR 115 | Ht 66.0 in | Wt 164.4 lb

## 2024-03-06 DIAGNOSIS — R0902 Hypoxemia: Secondary | ICD-10-CM | POA: Diagnosis present

## 2024-03-06 DIAGNOSIS — J189 Pneumonia, unspecified organism: Secondary | ICD-10-CM

## 2024-03-06 LAB — CBC WITH DIFFERENTIAL/PLATELET
Basophils Absolute: 0 K/uL (ref 0.0–0.1)
Basophils Relative: 0.4 % (ref 0.0–3.0)
Eosinophils Absolute: 0.1 K/uL (ref 0.0–0.7)
Eosinophils Relative: 1.9 % (ref 0.0–5.0)
HCT: 31.6 % — ABNORMAL LOW (ref 36.0–46.0)
Hemoglobin: 10.2 g/dL — ABNORMAL LOW (ref 12.0–15.0)
Lymphocytes Relative: 28 % (ref 12.0–46.0)
Lymphs Abs: 1.4 K/uL (ref 0.7–4.0)
MCHC: 32.2 g/dL (ref 30.0–36.0)
MCV: 97.4 fl (ref 78.0–100.0)
Monocytes Absolute: 0.6 K/uL (ref 0.1–1.0)
Monocytes Relative: 11.6 % (ref 3.0–12.0)
Neutro Abs: 2.9 K/uL (ref 1.4–7.7)
Neutrophils Relative %: 58.1 % (ref 43.0–77.0)
Platelets: 195 K/uL (ref 150.0–400.0)
RBC: 3.24 Mil/uL — ABNORMAL LOW (ref 3.87–5.11)
RDW: 17.3 % — ABNORMAL HIGH (ref 11.5–15.5)
WBC: 5.1 K/uL (ref 4.0–10.5)

## 2024-03-06 MED ORDER — AMOXICILLIN 500 MG PO CAPS: 1000.0000 mg | ORAL_CAPSULE | Freq: Three times a day (TID) | ORAL | 0 refills | Status: DC

## 2024-03-06 MED ORDER — IOHEXOL 350 MG/ML SOLN
100.0000 mL | Freq: Once | INTRAVENOUS | Status: AC | PRN
  Administered 2024-03-06: 75 mL via INTRAVENOUS

## 2024-03-06 NOTE — Patient Instructions (Signed)
 Please go to lab and then we will get you set up for a CT angiogram to check on your lungs in more detail today/ rule out blood clot

## 2024-03-11 ENCOUNTER — Other Ambulatory Visit: Payer: Self-pay | Admitting: Family

## 2024-03-11 ENCOUNTER — Ambulatory Visit: Payer: Self-pay | Admitting: Family

## 2024-03-11 DIAGNOSIS — J189 Pneumonia, unspecified organism: Secondary | ICD-10-CM

## 2024-03-11 MED ORDER — AZITHROMYCIN 250 MG PO TABS: ORAL_TABLET | ORAL | 0 refills | Status: AC

## 2024-03-11 MED ORDER — AMOXICILLIN 500 MG PO CAPS: ORAL_CAPSULE | ORAL | 0 refills | Status: DC

## 2024-03-11 NOTE — Telephone Encounter (Signed)
 FYI Only or Action Required?: Action required by provider: medication refill request, clinical question for provider, and update on patient condition. Pt wanting to refill amoxicillin . Cough has improved but not completely resolved. Declines appt. Please advise.  Patient was last seen in primary care on 03/06/2024 by Ariel Ariel [name], Ariel BROCKS, MD.  Called Nurse Triage reporting Cough.  Symptoms began 1.5 weeks ago.  Interventions attempted: Prescription medications: Levaquin  and amoxicillin .  Symptoms are: gradually improving.  Triage Disposition: See Physician Within 24 Hours  Patient/caregiver understands and will follow disposition?: No, wishes to speak with PCP      Has had PNA for 1.5 weeks with deep cough. Amoxicillin  prescribed by Dr. Watt on 12/31. Has been feeling better but not completely resolved. No SOB or CP or fever, just the cough lingering with green mucus.   Wanting to refill the med. Will run out by end of tomorrow. Currently taking levaquin  prescribed by [facility] provider as well, started in October and will run out in a month. Declines appt. Forwarding request to office. Advised UC or ED for worsening symptoms.      Copied from CRM [id number]. Topic: Clinical - Medication Refill >> Mar 11, 2024  3:48 PM Amber H wrote: Medication: amoxicillin  (AMOXIL ) 500 MG capsule   Has the patient contacted their pharmacy? No, she does not have any refills. Stated she spoke with provider today and she was supposed to call the medication in.  (Agent: If no, request that the patient contact the pharmacy for the refill. If patient does not wish to contact the pharmacy document the reason why and proceed with request.) (Agent: If yes, when and what did the pharmacy advise?)   This is the patient's preferred pharmacy:  [facility] [place], [place] - [address] Ariel Ariel [name] [place] [address] Phone: [phone] Fax: [phone]   Is this  the correct pharmacy for this prescription? Yes If no, delete pharmacy and type the correct one.    Has the prescription been filled recently? Yes   Is the patient out of the medication? Yes   Has the patient been seen for an appointment in the last year OR does the patient have an upcoming appointment? Yes, 03/06/2024   Can we respond through MyChart? Yes   Agent: Please be advised that Rx refills may take up to 3 business days. We ask that you follow-up with your pharmacy.        Reason for Disposition  SEVERE coughing spells (e.g., whooping sound after coughing, vomiting after coughing)  Answer Assessment - Initial Assessment Questions 1. ONSET: When did the cough begin?      1.5 weeks ago  2. SEVERITY: How bad is the cough today?      Moderate to severe  3. SPUTUM: Describe the color of your sputum (e.g., none, dry cough; clear, white, yellow, green)     Green  4. HEMOPTYSIS: Are you coughing up any blood? If Yes, ask: How much? (e.g., flecks, streaks, tablespoons, etc.)     Denies  5. DIFFICULTY BREATHING: Are you having difficulty breathing? If Yes, ask: How bad is it? (e.g., mild, moderate, severe)      Denies  6. FEVER: Do you have a fever? If Yes, ask: What is your temperature, how was it measured, and when did it start?     Denies  7. CARDIAC HISTORY: Do you have any history of heart disease? (e.g., heart attack, congestive heart failure)  TIA   8. LUNG HISTORY: Do you have any history of lung disease?  (e.g., pulmonary embolus, asthma, emphysema)     Denies  9. PE RISK FACTORS: Do you have a history of blood clots? (or: recent major surgery, recent prolonged travel, bedridden)     Denies  10. OTHER SYMPTOMS: Do you have any other symptoms? (e.g., runny nose, wheezing, chest pain)       Denies  Protocols used: Cough - Acute Productive-A-AH

## 2024-03-11 NOTE — Addendum Note (Signed)
 Addended by: WATT RAISIN C on: 03/11/2024 05:25 PM   Modules accepted: Orders

## 2024-03-11 NOTE — Telephone Encounter (Unsigned)
 Copied from CRM [id number]. Topic: Clinical - Medication Refill >> Mar 11, 2024  3:48 PM Amber H wrote: Medication: amoxicillin  (AMOXIL ) 500 MG capsule  Has the patient contacted their pharmacy? No, she does not have any refills. Stated she spoke with provider today and she was supposed to call the medication in.  (Agent: If no, request that the patient contact the pharmacy for the refill. If patient does not wish to contact the pharmacy document the reason why and proceed with request.) (Agent: If yes, when and what did the pharmacy advise?)  This is the patient's preferred pharmacy:  [facility] [place], [place] - [address] Jewell BROCKS [place] [address] Phone: [phone] Fax: [phone]  Is this the correct pharmacy for this prescription? Yes If no, delete pharmacy and type the correct one.   Has the prescription been filled recently? Yes  Is the patient out of the medication? Yes  Has the patient been seen for an appointment in the last year OR does the patient have an upcoming appointment? Yes, 03/06/2024  Can we respond through MyChart? Yes  Agent: Please be advised that Rx refills may take up to 3 business days. We ask that you follow-up with your pharmacy.

## 2024-03-12 NOTE — Telephone Encounter (Signed)
 Given her complicated medical history and ongoing symptoms/recent pneumonia she needs to be seen and re-examined to decide best treatment options to help her.

## 2024-03-31 ENCOUNTER — Observation Stay (HOSPITAL_COMMUNITY)
Admission: EM | Admit: 2024-03-31 | Discharge: 2024-04-03 | Disposition: A | Discharge status: Skilled Nursing Facility | Attending: Emergency Medicine | Admitting: Emergency Medicine

## 2024-03-31 ENCOUNTER — Encounter (HOSPITAL_COMMUNITY): Payer: Self-pay | Admitting: Hospitalist

## 2024-03-31 ENCOUNTER — Observation Stay (HOSPITAL_COMMUNITY)

## 2024-03-31 ENCOUNTER — Other Ambulatory Visit: Payer: Self-pay

## 2024-03-31 ENCOUNTER — Emergency Department (HOSPITAL_COMMUNITY)

## 2024-03-31 DIAGNOSIS — Z8571 Personal history of Hodgkin lymphoma: Secondary | ICD-10-CM | POA: Insufficient documentation

## 2024-03-31 DIAGNOSIS — E876 Hypokalemia: Principal | ICD-10-CM | POA: Insufficient documentation

## 2024-03-31 DIAGNOSIS — R9389 Abnormal findings on diagnostic imaging of other specified body structures: Secondary | ICD-10-CM | POA: Insufficient documentation

## 2024-03-31 DIAGNOSIS — R638 Other symptoms and signs concerning food and fluid intake: Secondary | ICD-10-CM

## 2024-03-31 DIAGNOSIS — R531 Weakness: Secondary | ICD-10-CM | POA: Insufficient documentation

## 2024-03-31 DIAGNOSIS — Y92009 Unspecified place in unspecified non-institutional (private) residence as the place of occurrence of the external cause: Secondary | ICD-10-CM | POA: Insufficient documentation

## 2024-03-31 DIAGNOSIS — W010XXA Fall on same level from slipping, tripping and stumbling without subsequent striking against object, initial encounter: Secondary | ICD-10-CM | POA: Insufficient documentation

## 2024-03-31 DIAGNOSIS — E162 Hypoglycemia, unspecified: Secondary | ICD-10-CM | POA: Insufficient documentation

## 2024-03-31 DIAGNOSIS — D72819 Decreased white blood cell count, unspecified: Secondary | ICD-10-CM | POA: Insufficient documentation

## 2024-03-31 DIAGNOSIS — E778 Other disorders of glycoprotein metabolism: Secondary | ICD-10-CM | POA: Insufficient documentation

## 2024-03-31 DIAGNOSIS — D696 Thrombocytopenia, unspecified: Secondary | ICD-10-CM | POA: Insufficient documentation

## 2024-03-31 DIAGNOSIS — R296 Repeated falls: Secondary | ICD-10-CM | POA: Insufficient documentation

## 2024-03-31 DIAGNOSIS — Z96643 Presence of artificial hip joint, bilateral: Secondary | ICD-10-CM | POA: Insufficient documentation

## 2024-03-31 DIAGNOSIS — W1830XA Fall on same level, unspecified, initial encounter: Secondary | ICD-10-CM | POA: Diagnosis present

## 2024-03-31 DIAGNOSIS — Z85820 Personal history of malignant melanoma of skin: Secondary | ICD-10-CM | POA: Insufficient documentation

## 2024-03-31 LAB — CBC WITH DIFFERENTIAL/PLATELET
Abs Immature Granulocytes: 0.06 10*3/uL (ref 0.00–0.07)
Basophils Absolute: 0 10*3/uL (ref 0.0–0.1)
Basophils Relative: 1 %
Eosinophils Absolute: 0 10*3/uL (ref 0.0–0.5)
Eosinophils Relative: 1 %
HCT: 37 % (ref 36.0–46.0)
Hemoglobin: 12.4 g/dL (ref 12.0–15.0)
Immature Granulocytes: 2 %
Lymphocytes Relative: 29 %
Lymphs Abs: 0.9 10*3/uL (ref 0.7–4.0)
MCH: 31.4 pg (ref 26.0–34.0)
MCHC: 33.5 g/dL (ref 30.0–36.0)
MCV: 93.7 fL (ref 80.0–100.0)
Monocytes Absolute: 0.7 10*3/uL (ref 0.1–1.0)
Monocytes Relative: 22 %
Neutro Abs: 1.4 10*3/uL — ABNORMAL LOW (ref 1.7–7.7)
Neutrophils Relative %: 45 %
Platelets: 152 10*3/uL (ref 150–400)
RBC: 3.95 MIL/uL (ref 3.87–5.11)
RDW: 14.7 % (ref 11.5–15.5)
WBC: 3 10*3/uL — ABNORMAL LOW (ref 4.0–10.5)
nRBC: 0 % (ref 0.0–0.2)

## 2024-03-31 LAB — COMPREHENSIVE METABOLIC PANEL WITH GFR
ALT: 20 U/L (ref 0–44)
AST: 22 U/L (ref 15–41)
Albumin: 2.5 g/dL — ABNORMAL LOW (ref 3.5–5.0)
Alkaline Phosphatase: 59 U/L (ref 38–126)
Anion gap: 10 (ref 5–15)
BUN: 8 mg/dL (ref 8–23)
CO2: 15 mmol/L — ABNORMAL LOW (ref 22–32)
Calcium: 5.2 mg/dL — CL (ref 8.9–10.3)
Chloride: 120 mmol/L — ABNORMAL HIGH (ref 98–111)
Creatinine, Ser: <0.3 mg/dL — ABNORMAL LOW (ref 0.44–1.00)
Glucose, Bld: 55 mg/dL — ABNORMAL LOW (ref 70–99)
Potassium: 2.1 mmol/L — CL (ref 3.5–5.1)
Sodium: 145 mmol/L (ref 135–145)
Total Bilirubin: 0.3 mg/dL (ref 0.0–1.2)
Total Protein: 3.4 g/dL — ABNORMAL LOW (ref 6.5–8.1)

## 2024-03-31 LAB — PREALBUMIN: Prealbumin: 16 mg/dL — ABNORMAL LOW (ref 18–38)

## 2024-03-31 LAB — TROPONIN T, HIGH SENSITIVITY
Troponin T High Sensitivity: 10 ng/L (ref 0–19)
Troponin T High Sensitivity: 17 ng/L (ref 0–19)

## 2024-03-31 LAB — CBG MONITORING, ED: Glucose-Capillary: 84 mg/dL (ref 70–99)

## 2024-03-31 LAB — VITAMIN D 25 HYDROXY (VIT D DEFICIENCY, FRACTURES): Vit D, 25-Hydroxy: 45 ng/mL (ref 30–100)

## 2024-03-31 LAB — TSH: TSH: 0.889 u[IU]/mL (ref 0.350–4.500)

## 2024-03-31 LAB — MAGNESIUM: Magnesium: 2 mg/dL (ref 1.7–2.4)

## 2024-03-31 LAB — CK: Total CK: 86 U/L (ref 38–234)

## 2024-03-31 MED ORDER — SODIUM CHLORIDE 0.9% FLUSH: 10.0000 mL | INTRAVENOUS | Status: DC | PRN

## 2024-03-31 MED ORDER — CALCIUM GLUCONATE-NACL 2-0.675 GM/100ML-% IV SOLN
2.0000 g | Freq: Once | INTRAVENOUS | Status: AC
  Administered 2024-03-31: 2000 mg via INTRAVENOUS
  Filled 2024-03-31: qty 100

## 2024-03-31 MED ORDER — CHLORHEXIDINE GLUCONATE CLOTH 2 % EX PADS
6.0000 | MEDICATED_PAD | Freq: Every day | CUTANEOUS | Status: DC
  Administered 2024-04-01 – 2024-04-03 (×3): 6 via TOPICAL

## 2024-03-31 MED ORDER — POTASSIUM CHLORIDE 10 MEQ/100ML IV SOLN
10.0000 meq | INTRAVENOUS | Status: AC
  Administered 2024-03-31 (×4): 10 meq via INTRAVENOUS
  Filled 2024-03-31 (×5): qty 100

## 2024-03-31 MED ORDER — ORAL CARE MOUTH RINSE: 15.0000 mL | OROMUCOSAL | Status: DC | PRN

## 2024-03-31 MED ORDER — DAPSONE 100 MG PO TABS
100.0000 mg | ORAL_TABLET | Freq: Every day | ORAL | Status: DC
  Filled 2024-03-31: qty 1

## 2024-03-31 MED ORDER — SODIUM CHLORIDE 0.9% FLUSH
10.0000 mL | Freq: Two times a day (BID) | INTRAVENOUS | Status: DC
  Administered 2024-03-31 – 2024-04-02 (×5): 10 mL
  Administered 2024-04-03: 20 mL

## 2024-03-31 MED ORDER — ACYCLOVIR 400 MG PO TABS
400.0000 mg | ORAL_TABLET | Freq: Two times a day (BID) | ORAL | Status: DC
  Filled 2024-03-31: qty 1

## 2024-03-31 MED ORDER — MAGNESIUM SULFATE 2 GM/50ML IV SOLN
2.0000 g | Freq: Once | INTRAVENOUS | Status: AC
  Administered 2024-03-31: 2 g via INTRAVENOUS
  Filled 2024-03-31: qty 50

## 2024-03-31 MED ORDER — POTASSIUM CHLORIDE CRYS ER 20 MEQ PO TBCR
40.0000 meq | EXTENDED_RELEASE_TABLET | Freq: Once | ORAL | Status: AC
  Administered 2024-03-31: 40 meq via ORAL
  Filled 2024-03-31: qty 2

## 2024-03-31 MED ORDER — POTASSIUM CHLORIDE CRYS ER 20 MEQ PO TBCR: 40.0000 meq | EXTENDED_RELEASE_TABLET | Freq: Three times a day (TID) | ORAL | Status: DC

## 2024-03-31 NOTE — ED Triage Notes (Signed)
 Pt brought in by EMS. Reports pt had a fall last night but did not call anyone. Some came in the home and found pt on the floor.Denied injury. No LOC, No thinners. Denies pain or discomfort. Pt has had 4 falls within the last 2 weeks.  EMS vitals BP 150/72 P 80 O2 97% RA CBG 112

## 2024-03-31 NOTE — ED Notes (Signed)
 Pt transported to MRI via stretcher.

## 2024-04-01 ENCOUNTER — Observation Stay (HOSPITAL_COMMUNITY)

## 2024-04-01 ENCOUNTER — Encounter (HOSPITAL_COMMUNITY): Payer: Self-pay | Admitting: Hospitalist

## 2024-04-01 LAB — CBC
HCT: 35.8 % — ABNORMAL LOW (ref 36.0–46.0)
Hemoglobin: 11.6 g/dL — ABNORMAL LOW (ref 12.0–15.0)
MCH: 30.9 pg (ref 26.0–34.0)
MCHC: 32.4 g/dL (ref 30.0–36.0)
MCV: 95.2 fL (ref 80.0–100.0)
Platelets: 134 10*3/uL — ABNORMAL LOW (ref 150–400)
RBC: 3.76 MIL/uL — ABNORMAL LOW (ref 3.87–5.11)
RDW: 14.8 % (ref 11.5–15.5)
WBC: 2.3 10*3/uL — ABNORMAL LOW (ref 4.0–10.5)
nRBC: 0 % (ref 0.0–0.2)

## 2024-04-01 LAB — DIFFERENTIAL
Abs Immature Granulocytes: 0.06 10*3/uL (ref 0.00–0.07)
Basophils Absolute: 0 10*3/uL (ref 0.0–0.1)
Basophils Relative: 0 %
Eosinophils Absolute: 0.1 10*3/uL (ref 0.0–0.5)
Eosinophils Relative: 4 %
Immature Granulocytes: 3 %
Lymphocytes Relative: 32 %
Lymphs Abs: 0.7 10*3/uL (ref 0.7–4.0)
Monocytes Absolute: 0.7 10*3/uL (ref 0.1–1.0)
Monocytes Relative: 30 %
Neutro Abs: 0.7 10*3/uL — ABNORMAL LOW (ref 1.7–7.7)
Neutrophils Relative %: 31 %

## 2024-04-01 LAB — URINALYSIS, ROUTINE W REFLEX MICROSCOPIC
Bacteria, UA: NONE SEEN
Bilirubin Urine: NEGATIVE
Glucose, UA: NEGATIVE mg/dL
Ketones, ur: 80 mg/dL — AB
Nitrite: NEGATIVE
Protein, ur: 30 mg/dL — AB
Specific Gravity, Urine: 1.021 (ref 1.005–1.030)
WBC, UA: >50 WBC/hpf (ref 0–5)
pH: 5 (ref 5.0–8.0)

## 2024-04-01 LAB — PTH, INTACT AND CALCIUM
Calcium, Total (PTH): 10.7 mg/dL — ABNORMAL HIGH (ref 8.7–10.3)
PTH: 10 pg/mL — ABNORMAL LOW (ref 15–65)

## 2024-04-01 LAB — BASIC METABOLIC PANEL WITH GFR
Anion gap: 11 (ref 5–15)
BUN: 10 mg/dL (ref 8–23)
CO2: 24 mmol/L (ref 22–32)
Calcium: 9.2 mg/dL (ref 8.9–10.3)
Chloride: 101 mmol/L (ref 98–111)
Creatinine, Ser: 0.6 mg/dL (ref 0.44–1.00)
GFR, Estimated: >60 mL/min
Glucose, Bld: 84 mg/dL (ref 70–99)
Potassium: 4.2 mmol/L (ref 3.5–5.1)
Sodium: 136 mmol/L (ref 135–145)

## 2024-04-01 LAB — PATHOLOGIST SMEAR REVIEW

## 2024-04-01 MED ORDER — GADOBUTROL 1 MMOL/ML IV SOLN
6.5000 mL | Freq: Once | INTRAVENOUS | Status: AC | PRN
  Administered 2024-04-01: 6.5 mL via INTRAVENOUS

## 2024-04-01 MED ORDER — ENSURE PLUS HIGH PROTEIN PO LIQD
237.0000 mL | Freq: Two times a day (BID) | ORAL | Status: DC
  Administered 2024-04-01 – 2024-04-03 (×4): 237 mL via ORAL

## 2024-04-01 MED ORDER — ADULT MULTIVITAMIN W/MINERALS CH
1.0000 | ORAL_TABLET | Freq: Every day | ORAL | Status: DC
  Administered 2024-04-01 – 2024-04-03 (×3): 1 via ORAL
  Filled 2024-04-01 (×3): qty 1

## 2024-04-01 NOTE — Discharge Instructions (Signed)

## 2024-04-01 NOTE — Care Management (Signed)
 SDOH resources added to AVS

## 2024-04-01 NOTE — Care Management Obs Status (Signed)
 MEDICARE OBSERVATION STATUS NOTIFICATION   Patient Details  Name: Ariel [name] MRN: [id number] Date of Birth: May 16, 1955   Medicare Observation Status Notification Given:  Yes  Obs notice signed and a copy given  Claretta Deed 04/01/2024, 10:22 AM

## 2024-04-01 NOTE — Plan of Care (Signed)

## 2024-04-01 NOTE — TOC Initial Note (Signed)
 Transition of Care [facility]) - Initial/Assessment Note    Patient Details  Name: Ariel [name] MRN: [id number] Date of Birth: 02-Oct-1955  Transition of Care [facility]) CM/SW Contact:    Luann SHAUNNA Cumming, LCSW Phone Number: 04/01/2024, 2:03 PM  Clinical Narrative:                  Met with pt to discuss SNF recommendations. She lives at [facility]. CSW explained medicare coverage and SNF auth process. She is agreeable to SNF w/u. Fl2 completed and bed requests sent in hub. TOC will follow up with SNF bed offers once available.   Expected Discharge Plan: Skilled Nursing Facility Barriers to Discharge: Continued Medical Work up, SNF Pending bed offer     Expected Discharge Plan and Services       Living arrangements for the past 2 months: Independent Living Facility                                      Prior Living Arrangements/Services Living arrangements for the past 2 months: Independent Living Facility Lives with:: Self Patient language and need for interpreter reviewed:: Yes        Need for Family Participation in Patient Care: No (Comment) Care giver support system in place?: Yes (comment)   Criminal Activity/Legal Involvement Pertinent to Current Situation/Hospitalization: No - Comment as needed  Activities of Daily Living   ADL Screening (condition at time of admission) Independently performs ADLs?: Yes (appropriate for developmental age) Is the patient deaf or have difficulty hearing?: No Does the patient have difficulty seeing, even when wearing glasses/contacts?: No Does the patient have difficulty concentrating, remembering, or making decisions?: No  Permission Sought/Granted                  Emotional Assessment Appearance:: Appears younger than stated age Attitude/Demeanor/Rapport: Engaged Affect (typically observed): Accepting Orientation: : Oriented to Self, Oriented to Place, Oriented to  Time, Oriented to Situation Alcohol / Substance Use:  Not Applicable Psych Involvement: No (comment)  Admission diagnosis:  Hypocalcemia [E83.51] Hypokalemia [E87.6] Hypoglycemia [E16.2] Decreased oral intake [R63.8] Generalized weakness [R53.1] Recurrent falls [R29.6] Ground-level fall [W18.30XA] Patient Active Problem List   Diagnosis Date Noted   Ground-level fall 03/31/2024   Preventative health care 02/15/2024   Viral URI 02/15/2024   Lymphoma malignant, large cell (HCC) 11/27/2022   UTI (urinary tract infection) 10/30/2022   Ataxia 10/29/2022   Leukocytosis 04/14/2022   Thrombocytosis, unspecified 04/14/2022   Epistaxis 04/08/2022   COVID-19 virus infection 04/05/2022   Hypokalemia 04/05/2022   Hypophosphatemia 04/05/2022   SIADH (syndrome of inappropriate ADH production) 04/05/2022   Protein-calorie malnutrition, moderate 03/28/2022   Palliative care by specialist 03/23/2022   Aphasia 03/17/2022   CNS lymphoma (HCC) 03/10/2022   Primary localized osteoarthritis of right hip 01/10/2022   Primary localized osteoarthritis of left hip 10/11/2021   Preoperative clearance 09/13/2021   Encounter for screening mammogram for malignant neoplasm of breast 09/13/2021   Pancytopenia (HCC) 05/16/2021   Lymphoma malignant, 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   Diffuse large B cell lymphoma (HCC) 03/07/2021   High grade B-cell lymphoma (HCC) 12/22/2020   Goals of care, counseling/discussion 12/22/2020   Malignant neoplasm metastatic to brain (HCC) 12/09/2020   Brain tumor (HCC) 12/08/2020   Night sweat 11/30/2020   Complication of anesthesia 11/22/2020   PONV (postoperative nausea and vomiting) 11/22/2020   Hyperglycemia 11/10/2020   TIA (  transient ischemic attack) 10/07/2020   Iron deficiency anemia 10/07/2020   Melanoma (HCC) 2021   Multinodular goiter 12/22/2013   Subclinical hyperthyroidism 12/19/2013   Urinary incontinence 11/19/2013   Hyperlipidemia 11/19/2012   Routine general medical examination at a health care facility  11/18/2012   PCP:  [facility] Pharmacy:   [facility] [place], [place] - [address] [place] [address] Phone: [phone] Fax: [phone]  DARRYLE LONG - [facility] [address] [place] [address] Phone: [phone] Fax: [phone]     Social Drivers of Health (SDOH) Social History: SDOH Screenings   Food Insecurity: No Food Insecurity (03/31/2024)  Housing: Low Risk (03/31/2024)  Transportation Needs: No Transportation Needs (03/31/2024)  Utilities: Not At Risk (03/31/2024)  Alcohol Screen: Low Risk (09/05/2022)  Depression (PHQ2-9): Medium Risk (03/06/2024)  Financial Resource Strain: Patient Declined (02/08/2024)  Physical Activity: Sufficiently Active (02/08/2024)  Social Connections: Socially Isolated (03/31/2024)  Stress: No Stress Concern Present (02/08/2024)  Tobacco Use: Low Risk (03/31/2024)   SDOH Interventions: Social Connections Interventions: Community Resources Provided, Inpatient TOC   Readmission Risk Interventions    03/09/2023    1:25 PM 02/02/2023   11:21 AM 01/12/2023   12:30 PM  Readmission Risk Prevention Plan  Transportation Screening Complete Complete Complete  PCP or Specialist Appt within 3-5 Days  Complete Complete  HRI or Home Care Consult Complete Complete Complete  Social Work Consult for Recovery Care Planning/Counseling Complete Complete Complete  Palliative Care Screening Not Applicable Not Applicable Not Applicable  Medication Review Oceanographer) Complete Complete Complete

## 2024-04-01 NOTE — Progress Notes (Signed)
 Initial Nutrition Assessment  DOCUMENTATION CODES:  Not applicable  INTERVENTION:  Multivitamin w/ minerals daily Ensure Plus High Protein po BID, each supplement provides 350 kcal and 20 grams of protein Encourage good PO intake  Obtain standing scale weight  NUTRITION DIAGNOSIS:  Increased nutrient needs related to chronic illness as evidenced by estimated needs.  GOAL:  Patient will meet greater than or equal to 90% of their needs  MONITOR:  PO intake, Supplement acceptance, Labs, Weight trends  REASON FOR ASSESSMENT:  Consult Assessment of nutrition requirement/status, Calorie Count  ASSESSMENT:  69 y.o. female presented to the ED after a fall. PMH includes high grade B-cell lymphoma of CNS, TIA, malnutrition, and HLD. Pt admitted with fall and electrolyte abnormalities.   2/02 - Admitted  Patient sitting in chair, about to eat lunch. Reports that her appetite appears to be improving. Shares that her appetite has not been great the past few weeks, has primarily just been snacking on nuts (peanuts or cashews). Typically drinks one Ensure per day, does not have a flavor preference. Denies any nausea or vomiting.   RD discussed continuing the Ensure supplement while in the hospital, patient agreeable.    Meal Intake 2/02: 25% dinner    Admission/Current Weight: 66.7 kg (appears stated) Patient endorses a 10# weight loss over the past month. Reports that her UBW is 177# and was happy with some weight loss. Per chart review, patient with a 10% weight loss over the past month, which would be significant. Although, question accuracy of weight given it appears stated and not true weight. Recommend obtaining new standing scale weight, RD to order and let RN know.    Nutrition Related Medications: reviewed Labs: Sodium 136, Potassium 4.2,   CBG: 84 mg/dL x 24 hrs   NUTRITION - FOCUSED PHYSICAL EXAM: Flowsheet Row Most Recent Value  Orbital Region No depletion  Upper Arm Region  No depletion  Thoracic and Lumbar Region No depletion  Buccal Region No depletion  Temple Region Unable to assess  Clavicle Bone Region No depletion  Clavicle and Acromion Bone Region No depletion  Scapular Bone Region Unable to assess  Dorsal Hand No depletion  Patellar Region No depletion  Anterior Thigh Region Unable to assess  Posterior Calf Region No depletion  Edema (RD Assessment) None  Hair Unable to assess  Eyes Reviewed  Mouth Reviewed  Skin Reviewed  Nails Reviewed   Diet Order:   Diet Order             Diet regular Room service appropriate? Yes; Fluid consistency: Thin  Diet effective now                  EDUCATION NEEDS: No education needs have been identified at this time  Skin:  Skin Assessment: Reviewed RN Assessment  Last BM:  1/31  Height:  Ht Readings from Last 1 Encounters:  03/31/24 5' 6 (1.676 m)   Weight:  Wt Readings from Last 1 Encounters:  03/31/24 66.7 kg   Ideal Body Weight:  59.1 kg  BMI:  Body mass index is 23.73 kg/m.  Estimated Nutritional Needs:  Kcal:  1700-1900 Protein:  80-100 grams Fluid:  >/= 1.7 L   Nestora Glatter RD, LDN Registered Dietitian I Please see AMION for contact information

## 2024-04-02 MED ORDER — QUETIAPINE FUMARATE 25 MG PO TABS
25.0000 mg | ORAL_TABLET | ORAL | Status: AC
  Administered 2024-04-02: 25 mg via ORAL
  Filled 2024-04-02: qty 1

## 2024-04-02 NOTE — Discharge Summary (Addendum)
 Physician Discharge Summary  Ariel [name] FMW:[id number] DOB: 11-03-1955 DOA: 03/31/2024  PCP: [facility]  Admit date: 03/31/2024 Discharge date: 04/02/2024    Admitted From: Home Disposition: SNF  Recommendations for Outpatient Follow-up:  Follow up with PCP in 1-2 weeks Please obtain BMP/CBC in one week Follow-up with your primary oncologist at [facility] Please follow up with your PCP on the following pending results: Unresulted Labs (From admission, onward)     Start     Ordered   04/02/24 0808  CBC with Differential/Platelet  ONCE - URGENT,   URGENT       Question:  Specimen collection method  Answer:  IV Team=IV Team collect   04/02/24 9192   04/02/24 9191  Basic metabolic panel  Once,   R       Question:  Specimen collection method  Answer:  IV Team=IV Team collect   04/02/24 0807              Home Health: None Equipment/Devices: None none  Discharge Condition: Stable CODE STATUS: Full code Diet recommendation:  Diet Order             Diet regular Room service appropriate? Yes; Fluid consistency: Thin  Diet effective now                   Subjective: Seen and examined, she has no complaints.  She is eager to go to SNF today.  I have updated patient's daughter over the phone, answered questions.  Recommended follow-up with [facility] oncology sooner than 1 month.  Brief/Interim Summary: Ariel [name] is a 69 y.o. 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  Patient presented secondary to trip and fall and found to have hypocalcemia and hypokalemia requiring IV repletion.   Fall/deconditioning Patient with a trip and fall episode with no episode of syncope. Patient with associated electrolyte abnormalities. PT/OT consulted and recommend SNF on discharge. Patient lives at an independent living facility.  TOC is working on psychiatrist, if obtained, patient will be discharged  today.   Hypokalemia Resolved with repletion.   Hypocalcemia Resolved with repletion.   History of high grade diffuse B cell lymphoma In remission. MRI brain obtained on admission which is concerning for new/recurrent lymphoma versus metastatic disease.  Patient is totally asymptomatic so unsure why MRI brain with contrast obtained yesterday by the hospitalist which shows widespread abnormal parenchymal contrast-enhancement throughout both cerebral hemispheres involving all lobes most consistent with CNS lymphoma.  Patient follows with [facility] oncology.  She has an appointment in next few weeks.  She will follow-up with them as outpatient.   Increased nutrient needs Dietitian recommendations (2/3): Multivitamin w/ minerals daily Ensure Plus High Protein po BID, each supplement provides 350 kcal and 20 grams of protein Encourage good PO intake  Obtain standing scale weight  Discharge Diagnoses:  Principal Problem:   Ground-level fall    Discharge Instructions   Allergies as of 04/02/2024       Reactions   Decadron  [dexamethasone ] Other (See Comments)   Leg weakness Hallucinations   Plavix  [clopidogrel ] Hives, Swelling, Other (See Comments)   Lips became swollen   Prednisone Other (See Comments)   Steroids caused psychological issues  Hallucinations   Sulfa  Antibiotics Itching, Rash   Codeine Nausea And Vomiting        Medication List     STOP taking these medications    amoxicillin  500 MG  capsule Commonly known as: AMOXIL    prochlorperazine  10 MG tablet Commonly known as: COMPAZINE    promethazine -dextromethorphan  6.25-15 MG/5ML syrup Commonly known as: PROMETHAZINE -DM       TAKE these medications    acetaminophen  500 MG tablet Commonly known as: TYLENOL  Take 1,000 mg by mouth every 6 (six) hours as needed for headache, fever, moderate pain (pain score 4-6) or mild pain (pain score 1-3).   acyclovir  400 MG tablet Commonly known as: ZOVIRAX  Take 400 mg by  mouth 2 (two) times daily.   albuterol  108 (90 Base) MCG/ACT inhaler Commonly known as: VENTOLIN  HFA Inhale 2 puffs into the lungs every 6 (six) hours as needed for wheezing or shortness of breath.   azelastine  0.1 % nasal spray Commonly known as: ASTELIN  Place 1 spray into both nostrils 2 (two) times daily. Use in each nostril as directed   benzonatate  100 MG capsule Commonly known as: TESSALON  Take 1 capsule (100 mg total) by mouth 2 (two) times daily as needed for cough.   dapsone  100 MG tablet Take 100 mg by mouth daily.   ondansetron  8 MG disintegrating tablet Commonly known as: ZOFRAN -ODT Take 1 tablet (8 mg total) by mouth every 8 (eight) hours as needed for nausea or vomiting.        Follow-up Information     [facility] Follow up in 1 week(s).   Contact information: [address]  [address] [place] [address] [phone]                Allergies[1]  Consultations: None    Procedures/Studies: MR BRAIN W CONTRAST Result Date: 04/01/2024 EXAM: MRI BRAIN WITH CONTRAST 04/01/2024 09:25:27 PM TECHNIQUE: Multiplanar multisequence MRI of the head/brain was performed with the administration of 6.5 mL of gadobutrol  (GADAVIST ) 1 MMOL/ML intravenous contrast. The study is performed as an adjunct to the brain MRI without contrast performed on 03/31/2024. COMPARISON: Brain MRI without contrast performed on 03/31/2024. CLINICAL HISTORY: hx of lymphoma. Abnormal MRI brain. History of lymphoma. Abnormal MRI brain. FINDINGS: BRAIN AND VENTRICLES: No acute infarct. No acute intracranial hemorrhage. No hydrocephalus. The sella is unremarkable. Normal flow voids. Postcontrast imaging shows widespread abnormal parenchymal contrast enhancement within both cerebral hemispheres affecting all lobes of the brain. Most of the lesions are subcortical, though the largest lesion is located in the right frontal white matter. ORBITS: No significant abnormality.  SINUSES: No significant abnormality. BONES AND SOFT TISSUES: Normal bone marrow signal. No soft tissue abnormality. IMPRESSION: 1. Widespread abnormal parenchymal contrast enhancement throughout both cerebral hemispheres involving all lobes, most consistent with central nervous system lymphoma. Electronically signed by: Franky Stanford MD 04/01/2024 10:03 PM EST RP Workstation: [id number]   MR BRAIN WO CONTRAST Result Date: 03/31/2024 EXAM: MRI BRAIN WITHOUT CONTRAST 03/31/2024 04:03:40 PM TECHNIQUE: Multiplanar multisequence MRI of the head/brain was performed without the administration of intravenous contrast. COMPARISON: 08/06/2023 and 06/09/2023. CLINICAL HISTORY: Encephalopathy (Pediatric 0-17 years). FINDINGS: BRAIN AND VENTRICLES: There are significantly increased areas of T2 and FLAIR signal abnormality. Acute/subacute infarcts are present as described by areas of restricted diffusion. Encephalomalacia and gliosis in the left frontal lobe are suggestive of remote infarct with mild associated susceptibility. Specifically, there is a rounded confluent focus of signal abnormality in the right frontal lobe involving the periventricular white matter which measures approximately 2.4 x 2.2 cm which demonstrates mild restricted diffusion. There is associated edema in this region. Additional signal abnormality and edema are present in the bilateral occipital lobes with mild  gyral expansion. There is an additional more confluent focus of signal abnormality and restricted diffusion in the left occipital lobe which measures up to 1.0 cm. Additional signal abnormality and edema are noted in the lateral left temporal lobe as well as within the anterior and inferior right temporal lobe. There is additional signal abnormality in the bilateral basal ganglia and thalami. There are no significant areas of susceptibility within the regions of parenchymal signal abnormality. There is mild mass effect on the anterior body and  frontal horn of the right lateral ventricle. No significant midline shift. No intracranial hemorrhage. No mass. No hydrocephalus. The sella is unremarkable. Normal flow voids. ORBITS: Bilateral lens replacement. SINUSES AND MASTOIDS: Mucosal thickening throughout the ethmoid sinuses. Bilateral mastoid effusions. BONES AND SOFT TISSUES: Normal marrow signal. No soft tissue abnormality. IMPRESSION: 1. Significantly increased multifocal T2/FLAIR signal abnormality with associated edema and areas of restricted diffusion involving the right frontal periventricular white matter, bilateral occipital lobes, bilateral temporal lobes, and bilateral basal ganglia/thalami. Findings are most concerning for new/recurrent lymphoma versus metastatic disease. Recommend MRI with contrast for further evaluation and consider CSF sampling. Electronically signed by: Donnice Mania MD 03/31/2024 04:35 PM EST RP Workstation: [id number]   DG Chest Portable 1 View Result Date: 03/31/2024 EXAM: 1 VIEW(S) XRAY OF THE CHEST 03/31/2024 11:14:00 AM COMPARISON: 02/27/2024 CLINICAL HISTORY: Falls and weakness. FINDINGS: LINES, TUBES AND DEVICES: Right chest Port-A-Cath stable in place. LUNGS AND PLEURA: Improved right upper lobe airspace opacity. No pleural effusion. No pneumothorax. HEART AND MEDIASTINUM: No acute abnormality of the cardiac and mediastinal silhouettes. BONES AND SOFT TISSUES: No acute osseous abnormality. IMPRESSION: 1. Improved right upper lobe airspace opacity. 2. Right chest Port-A-Cath stable in position. Electronically signed by: Evalene Coho MD 03/31/2024 11:27 AM EST RP Workstation: [id number]   CT Angio Chest W/Cm &/Or Wo Cm Result Date: 03/06/2024 CLINICAL DATA:  Hypoxemia. Sick for 2 weeks. Diagnosed with pneumonia last week. Active lymphoma. Evaluate for pulmonary embolism. EXAM: CT ANGIOGRAPHY CHEST WITH CONTRAST TECHNIQUE: Multidetector CT imaging of the chest was performed using the standard protocol during  bolus administration of intravenous contrast. Multiplanar CT image reconstructions and MIPs were obtained to evaluate the vascular anatomy. RADIATION DOSE REDUCTION: This exam was performed according to the departmental dose-optimization program which includes automated exposure control, adjustment of the mA and/or kV according to patient size and/or use of iterative reconstruction technique. CONTRAST:  75mL OMNIPAQUE  IOHEXOL  350 MG/ML SOLN COMPARISON:  CT 02/21/2022, chest x-ray 02/22/2024 and 02/27/2024 FINDINGS: Cardiovascular: Right IJ Port-A-Cath has tip over the cavoatrial junction. Heart is normal size. Mild calcified plaque over the left anterior descending coronary artery. Thoracic aorta is normal in caliber. Pulmonary arterial system is well opacified without evidence of emboli. Remaining vascular structures are unremarkable. Mediastinum/Nodes: No mediastinal or hilar adenopathy. Remaining mediastinal structures are unremarkable. Lungs/Pleura: Lungs are adequately inflated. There is moderate consolidation over the inferior aspect of the right upper lobe abutting the minor fissure. Subtle patchy airspace density over the left upper lobe and minimally left lower lobe. These findings likely due to multifocal infection. No effusion. Airways are unremarkable. Upper Abdomen: Images through the upper abdomen demonstrate calcified plaque over the visualized abdominal aorta. No acute findings. Musculoskeletal: No focal abnormality. Review of the MIP images confirms the above findings. IMPRESSION: 1. No evidence of pulmonary embolism. 2. Persistent consolidation over the inferior aspect of the right upper lobe. Subtle patchy airspace density over the left upper lobe and minimally left lower lobe. Findings likely due to  multifocal pneumonia. Recommend follow-up chest CT 4-6 weeks to document resolution. 3. Aortic atherosclerosis. Atherosclerotic coronary artery disease. Aortic Atherosclerosis (ICD10-I70.0).  Electronically Signed   By: Toribio Agreste M.D.   On: 03/06/2024 13:57     Discharge Exam: Vitals:   04/02/24 0407 04/02/24 0818  BP: (!) 102/56 (!) 129/55  Pulse: 81 81  Resp: 20 18  Temp: 97.7 F (36.5 C) 99.5 F (37.5 C)  SpO2: 97% 96%   Vitals:   04/01/24 2338 04/02/24 0407 04/02/24 0500 04/02/24 0818  BP: (!) 118/50 (!) 102/56  (!) 129/55  Pulse: 73 81  81  Resp: 20 20  18   Temp: 98.6 F (37 C) 97.7 F (36.5 C)  99.5 F (37.5 C)  TempSrc: Oral Oral  Oral  SpO2: 96% 97%  96%  Weight:   70.6 kg   Height:        General: Pt is alert, awake, not in acute distress Cardiovascular: RRR, S1/S2 +, no rubs, no gallops Respiratory: CTA bilaterally, no wheezing, no rhonchi Abdominal: Soft, NT, ND, bowel sounds + Extremities: no edema, no cyanosis    The results of significant diagnostics from this hospitalization (including imaging, microbiology, ancillary and laboratory) are listed below for reference.     Microbiology: No results found for this or any previous visit (from the past 240 hours).   Labs: BNP (last 3 results) No results for input(s): BNP in the last 8760 hours. Basic Metabolic Panel: Recent Labs  Lab 03/31/24 1154 03/31/24 1703 04/01/24 0227  NA 145  --  136  K 2.1*  --  4.2  CL 120*  --  101  CO2 15*  --  24  GLUCOSE 55*  --  84  BUN 8  --  10  CREATININE <0.30*  --  0.60  CALCIUM  5.2* 10.7* 9.2  MG  --  2.0  --    Liver Function Tests: Recent Labs  Lab 03/31/24 1154  AST 22  ALT 20  ALKPHOS 59  BILITOT 0.3  PROT 3.4*  ALBUMIN 2.5*   No results for input(s): LIPASE, AMYLASE in the last 168 hours. No results for input(s): AMMONIA in the last 168 hours. CBC: Recent Labs  Lab 03/31/24 1154 04/01/24 0227  WBC 3.0* 2.3*  NEUTROABS 1.4* 0.7*  HGB 12.4 11.6*  HCT 37.0 35.8*  MCV 93.7 95.2  PLT 152 134*   Cardiac Enzymes: Recent Labs  Lab 03/31/24 1154  CKTOTAL 86   BNP: Invalid input(s): POCBNP CBG: Recent Labs   Lab 03/31/24 1323  GLUCAP 84   D-Dimer No results for input(s): DDIMER in the last 72 hours. Hgb A1c No results for input(s): HGBA1C in the last 72 hours. Lipid Profile No results for input(s): CHOL, HDL, LDLCALC, TRIG, CHOLHDL, LDLDIRECT in the last 72 hours. Thyroid  function studies Recent Labs    03/31/24 1703  TSH 0.889   Anemia work up No results for input(s): VITAMINB12, FOLATE, FERRITIN, TIBC, IRON, RETICCTPCT in the last 72 hours. Urinalysis    Component Value Date/Time   COLORURINE YELLOW 03/31/2024 0037   APPEARANCEUR HAZY (A) 03/31/2024 0037   LABSPEC 1.021 03/31/2024 0037   PHURINE 5.0 03/31/2024 0037   GLUCOSEU NEGATIVE 03/31/2024 0037   GLUCOSEU NEGATIVE 11/30/2020 1218   HGBUR SMALL (A) 03/31/2024 0037   BILIRUBINUR NEGATIVE 03/31/2024 0037   BILIRUBINUR neg 06/15/2020 1333   KETONESUR 80 (A) 03/31/2024 0037   PROTEINUR 30 (A) 03/31/2024 0037   UROBILINOGEN 0.2 11/30/2020 1218   NITRITE  NEGATIVE 03/31/2024 0037   LEUKOCYTESUR LARGE (A) 03/31/2024 0037   Sepsis Labs Recent Labs  Lab 03/31/24 1154 04/01/24 0227  WBC 3.0* 2.3*   Microbiology No results found for this or any previous visit (from the past 240 hours).  FURTHER DISCHARGE INSTRUCTIONS:   Get Medicines reviewed and adjusted: Please take all your medications with you for your next visit with your Primary MD   Laboratory/radiological data: Please request your Primary MD to go over all hospital tests and procedure/radiological results at the follow up, please ask your Primary MD to get all Hospital records sent to his/her office.   In some cases, they will be blood work, cultures and biopsy results pending at the time of your discharge. Please request that your primary care M.D. goes through all the records of your hospital data and follows up on these results.   Also Note the following: If you experience worsening of your admission symptoms, develop shortness  of breath, life threatening emergency, suicidal or homicidal thoughts you must seek medical attention immediately by calling 911 or calling your MD immediately  if symptoms less severe.   You must read complete instructions/literature along with all the possible adverse reactions/side effects for all the Medicines you take and that have been prescribed to you. Take any new Medicines after you have completely understood and accpet all the possible adverse reactions/side effects.    patient was instructed, not to drive, operate heavy machinery, perform activities at heights, swimming or participation in water  activities or provide baby-sitting services while on Pain, Sleep and Anxiety Medications; until their outpatient Physician has advised to do so again. Also recommended to not to take more than prescribed Pain, Sleep and Anxiety Medications.  It is not advisable to combine anxiety, sleep and pain medications without talking with your primary care provider.     Wear Seat belts while driving.   Please note: You were cared for by a hospitalist during your hospital stay. Once you are discharged, your primary care physician will handle any further medical issues. Please note that NO REFILLS for any discharge medications will be authorized once you are discharged, as it is imperative that you return to your primary care physician (or establish a relationship with a primary care physician if you do not have one) for your post hospital discharge needs so that they can reassess your need for medications and monitor your lab values  Time coordinating discharge: Over 30 minutes  SIGNED:   Fredia Skeeter, MD  [facility] 04/02/2024, 10:04 AM *Please note that this is a verbal dictation therefore any spelling or grammatical errors are due to the Dragon Medical One system interpretation. If 7PM-7AM, please contact night-coverage [email or web address]     [1]  Allergies Allergen Reactions   Decadron   [Dexamethasone ] Other (See Comments)    Leg weakness Hallucinations   Plavix  [Clopidogrel ] Hives, Swelling and Other (See Comments)    Lips became swollen   Prednisone Other (See Comments)    Steroids caused psychological issues  Hallucinations   Sulfa  Antibiotics Itching and Rash   Codeine Nausea And Vomiting

## 2024-04-02 NOTE — TOC Progression Note (Addendum)
 Transition of Care [facility]) - Progression Note    Patient Details  Name: Ariel [name] MRN: [id number] Date of Birth: 09/27/1955  [facility]) CM/SW Contact  Luann SHAUNNA [place], [place] Phone Number: 04/02/2024, 9:43 AM  Clinical Narrative:     Met with pt and provided SNF bed offers with medicare star ratings. She requests CSW come back later for choice. CSW to follow up around lunch.   1130: Went to meet with pt for SNF choice though informed by RN that pt seems confused. CSW called and left message with pt's daughter requesting return call.  1300: Spoke with pt's son over the phone and provided SNF bed offers verbally. He states he will have to discuss with pt's daughter, Sonny [facility] submitted with pending facility. Ref# [id number]. Facility to be added to auth once chosen.   1320:  Spoke with pt's daughter over the phone. Explained medicare coverage and insurance auth process. She chooses [facility]. Confirmed bed with [facility]. [facility] to [facility]. [facility] is pending.   Expected Discharge Plan: Skilled Nursing Facility Barriers to Discharge: SNF choice and insurance auth               Expected Discharge Plan and Services       Living arrangements for the past 2 months: Independent Living Facility                                       Social Drivers of Health (SDOH) Interventions SDOH Screenings   Food Insecurity: No Food Insecurity (03/31/2024)  Housing: Low Risk (03/31/2024)  Transportation Needs: No Transportation Needs (03/31/2024)  Utilities: Not At Risk (03/31/2024)  Alcohol Screen: Low Risk (09/05/2022)  Depression (PHQ2-9): Medium Risk (03/06/2024)  Financial Resource Strain: Patient Declined (02/08/2024)  Physical Activity: Sufficiently Active (02/08/2024)  Social Connections: Socially Isolated (03/31/2024)  Stress: No Stress Concern Present (02/08/2024)  Tobacco Use: Low Risk (03/31/2024)    Readmission Risk Interventions    03/09/2023    1:25 PM  02/02/2023   11:21 AM 01/12/2023   12:30 PM  Readmission Risk Prevention Plan  Transportation Screening Complete Complete Complete  PCP or Specialist Appt within 3-5 Days  Complete Complete  HRI or Home Care Consult Complete Complete Complete  Social Work Consult for Recovery Care Planning/Counseling Complete Complete Complete  Palliative Care Screening Not Applicable Not Applicable Not Applicable  Medication Review Oceanographer) Complete Complete Complete

## 2024-04-02 NOTE — Progress Notes (Signed)
 Attempting to return call back to daughter Sonny for update no answer. Attempted to call Chad,son, no answer. Sherline Jubilee

## 2024-04-02 NOTE — TOC Progression Note (Deleted)
 Transition of Care [facility]) - Progression Note    Patient Details  Name: Fumiye Lubben MRN: [id number] Date of Birth: [birth year]  Transition of Care [facility]) CM/SW Contact  Luann SHAUNNA [place], [place] Phone Number: 04/02/2024, 12:30 PM  Clinical Narrative:       Expected Discharge Plan: Skilled Nursing Facility Barriers to Discharge: Continued Medical Work up, SNF Pending bed offer               Expected Discharge Plan and Services       Living arrangements for the past 2 months: Independent Living Facility Expected Discharge Date: 04/02/24                                     Social Drivers of Health (SDOH) Interventions SDOH Screenings   Food Insecurity: No Food Insecurity (03/31/2024)  Housing: Low Risk (03/31/2024)  Transportation Needs: No Transportation Needs (03/31/2024)  Utilities: Not At Risk (03/31/2024)  Alcohol Screen: Low Risk (09/05/2022)  Depression (PHQ2-9): Medium Risk (03/06/2024)  Financial Resource Strain: Patient Declined (02/08/2024)  Physical Activity: Sufficiently Active (02/08/2024)  Social Connections: Socially Isolated (03/31/2024)  Stress: No Stress Concern Present (02/08/2024)  Tobacco Use: Low Risk (03/31/2024)    Readmission Risk Interventions    03/09/2023    1:25 PM 02/02/2023   11:21 AM 01/12/2023   12:30 PM  Readmission Risk Prevention Plan  Transportation Screening Complete Complete Complete  PCP or Specialist Appt within 3-5 Days  Complete Complete  HRI or Home Care Consult Complete Complete Complete  Social Work Consult for Recovery Care Planning/Counseling Complete Complete Complete  Palliative Care Screening Not Applicable Not Applicable Not Applicable  Medication Review Oceanographer) Complete Complete Complete

## 2024-04-02 NOTE — Plan of Care (Signed)

## 2024-04-02 NOTE — Plan of Care (Signed)
" °  Problem: Clinical Measurements: Goal: Ability to maintain clinical measurements within normal limits will improve Outcome: Progressing Goal: Will remain free from infection Outcome: Progressing Goal: Respiratory complications will improve Outcome: Progressing   Problem: Pain Managment: Goal: General experience of comfort will improve and/or be controlled Outcome: Progressing   Problem: Safety: Goal: Ability to remain free from injury will improve Outcome: Progressing   Problem: Education: Goal: Knowledge of General Education information will improve Description: Including pain rating scale, medication(s)/side effects and non-pharmacologic comfort measures Outcome: Not Progressing   Problem: Health Behavior/Discharge Planning: Goal: Ability to manage health-related needs will improve Outcome: Not Progressing   "

## 2024-04-03 LAB — BASIC METABOLIC PANEL WITH GFR
Anion gap: 11 (ref 5–15)
BUN: 15 mg/dL (ref 8–23)
CO2: 28 mmol/L (ref 22–32)
Calcium: 9.6 mg/dL (ref 8.9–10.3)
Chloride: 102 mmol/L (ref 98–111)
Creatinine, Ser: 0.58 mg/dL (ref 0.44–1.00)
GFR, Estimated: >60 mL/min
Glucose, Bld: 108 mg/dL — ABNORMAL HIGH (ref 70–99)
Potassium: 4.1 mmol/L (ref 3.5–5.1)
Sodium: 140 mmol/L (ref 135–145)

## 2024-04-03 LAB — CBC WITH DIFFERENTIAL/PLATELET
Abs Immature Granulocytes: 0.07 10*3/uL (ref 0.00–0.07)
Basophils Absolute: 0 10*3/uL (ref 0.0–0.1)
Basophils Relative: 1 %
Eosinophils Absolute: 0.1 10*3/uL (ref 0.0–0.5)
Eosinophils Relative: 3 %
HCT: 36.9 % (ref 36.0–46.0)
Hemoglobin: 12.3 g/dL (ref 12.0–15.0)
Immature Granulocytes: 3 %
Lymphocytes Relative: 39 %
Lymphs Abs: 0.9 10*3/uL (ref 0.7–4.0)
MCH: 31.9 pg (ref 26.0–34.0)
MCHC: 33.3 g/dL (ref 30.0–36.0)
MCV: 95.8 fL (ref 80.0–100.0)
Monocytes Absolute: 0.6 10*3/uL (ref 0.1–1.0)
Monocytes Relative: 28 %
Neutro Abs: 0.6 10*3/uL — ABNORMAL LOW (ref 1.7–7.7)
Neutrophils Relative %: 26 %
Platelets: 130 10*3/uL — ABNORMAL LOW (ref 150–400)
RBC: 3.85 MIL/uL — ABNORMAL LOW (ref 3.87–5.11)
RDW: 14.8 % (ref 11.5–15.5)
Smear Review: NORMAL
WBC: 2.2 10*3/uL — ABNORMAL LOW (ref 4.0–10.5)
nRBC: 0 % (ref 0.0–0.2)

## 2024-04-03 MED ORDER — HEPARIN SOD (PORK) LOCK FLUSH 100 UNIT/ML IV SOLN
500.0000 [IU] | INTRAVENOUS | Status: AC | PRN
  Administered 2024-04-03: 500 [IU]

## 2024-04-03 NOTE — TOC Transition Note (Signed)
 Transition of Care [facility]) - Discharge Note   Patient Details  Name: Ariel [name] MRN: [id number] Date of Birth: [birth year]  Transition of Care [facility]) CM/SW Contact:  Luann SHAUNNA Cumming, LCSW Phone Number: 04/03/2024, 11:10 AM   Clinical Narrative:     Per MD patient ready for DC to [facility]. RN, patient, patient's family, and facility notified of DC. Discharge Summary and FL2 sent to facility. RN to call report prior to discharge [phone]). DC packet on chart. Ambulance transport requested for patient.   CSW will sign off for now as social work intervention is no longer needed. Please consult us  again if new needs arise.   Final next level of care: Skilled Nursing Facility Barriers to Discharge: No Barriers Identified   Patient Goals and CMS Choice   CMS Medicare.gov Compare Post Acute Care list provided to:: Patient Represenative (must comment) Kathye) Choice offered to / list presented to : Adult Children      Discharge Placement              Patient chooses bed at: [facility] Patient to be transferred to facility by: [facility] Name of family member notified: Purnell Raring  Daughter, Emergency Contact  [phone] (Mobile) Patient and family notified of of transfer: 04/03/24  Discharge Plan and Services Additional resources added to the After Visit Summary for                                       Social Drivers of Health (SDOH) Interventions SDOH Screenings   Food Insecurity: No Food Insecurity (03/31/2024)  Housing: Low Risk (03/31/2024)  Transportation Needs: No Transportation Needs (03/31/2024)  Utilities: Not At Risk (03/31/2024)  Alcohol Screen: Low Risk (09/05/2022)  Depression (PHQ2-9): Medium Risk (03/06/2024)  Financial Resource Strain: Patient Declined (02/08/2024)  Physical Activity: Sufficiently Active (02/08/2024)  Social Connections: Socially Isolated (03/31/2024)  Stress: No Stress Concern Present (02/08/2024)  Tobacco Use: Low Risk (03/31/2024)      Readmission Risk Interventions    03/09/2023    1:25 PM 02/02/2023   11:21 AM 01/12/2023   12:30 PM  Readmission Risk Prevention Plan  Transportation Screening Complete Complete Complete  PCP or Specialist Appt within 3-5 Days  Complete Complete  HRI or Home Care Consult Complete Complete Complete  Social Work Consult for Recovery Care Planning/Counseling Complete Complete Complete  Palliative Care Screening Not Applicable Not Applicable Not Applicable  Medication Review Oceanographer) Complete Complete Complete

## 2024-04-03 NOTE — Discharge Summary (Signed)
 Physician Discharge Summary  Ariel [name] FMW:[id number] DOB: 1955-12-05 DOA: 03/31/2024  PCP: [facility]  Admit date: 03/31/2024 Discharge date: 04/03/2024 Recommendations for Outpatient Follow-up:  Follow up with PCP in 1 weeks-call for appointment Please obtain BMP/CBC in one week Follow-up with your [facility] oncology as soon as possible to discuss further treatment option  Discharge Dispo: SNF Discharge Condition: Stable Code Status:   Code Status: Full Code Diet recommendation:  Diet Order             Diet regular Room service appropriate? Yes; Fluid consistency: Thin  Diet effective now                    Brief/Interim Summary: Ariel [name] is a 69 y.o. 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 Last oncology evaluation was 03/19/2024 at [facility] Dr Dittus MD.With history of  R/R SCNSL with brain/systemic at diagnosis_ refer to note as below.   S/P [place]-like regimen; MATRix, ibrutinib  AND off therapy for 4-6 weeks having ECOG PS 0   - can hold off on bridging for now - [facility] shows PET and MRI CR - proceed with LD chemo 9/12-9/14 and Breyanzi on 9/17 - ANC 2200: no need for levaquin /fluc - s/p pentamadine: stopped dapsone  on her own - stopped acyclovir  400mg  BID - I reiterated that she needs to be on PJP and Shingles ppx - we decided to try bactrim  Sat/Sun and valtrex he will see Dr. Augustin good morning how are you- she is recovering from CAP: s/p abx; cough takes a long  time to resolve in many cases (months) - 247mo: MRI CR; PET D1 CR - 47mo: MRI CR; PET D1 CR - RTC 2 mo for 6-mo MRI and PET with visit   I discussed with Dr. Lonn from oncology who reviewed chart and imaging> advised that she just had scans in another facility and we do not have any images for comparison and her Car-T cell treatment and affect brain imaging> she advised for family to contact her oncologist at [facility] who is ICU in the chart that has been contacted her [facility] oncology is to review and compared and okay for discharge to SNF Electrolytes were replaced and corrected. Patient has been stabilized and PT recommending SNF  and has Snf  Bed Today. Leukopenia and thrOmpbocytiopenia-wbc stable NEED TO FOLLOW UP IN A WEEK I discussed with patient's daughter regarding plan of care and she is in agreement with discharge to SNF today she has reached out to her oncology team who are aware about her MRI and her plan for discharge to SNF today and they will arrange outpatient follow-up soon to discuss further treatment plan     Discharge Diagnoses:  Principal Problem:   Ground-level fall Hypokalemia Hypocalcemia Leukopenia Thrombocytopenia Abnormal MRI finding with concern for new/recurrent lymphoma versus metastatic disease   Consultation: See note.  Discharge Instructions   Allergies as of 04/03/2024       Reactions   Decadron  [dexamethasone ] Other (See Comments)   Leg weakness Hallucinations  Plavix  [clopidogrel ] Hives, Swelling, Other (See Comments)   Lips became swollen   Prednisone Other (See Comments)   Steroids caused psychological issues  Hallucinations   Sulfa  Antibiotics Itching, Rash   Codeine Nausea And Vomiting        Medication List     STOP taking these medications    amoxicillin  500 MG capsule Commonly known as: AMOXIL    prochlorperazine  10 MG tablet Commonly known as: COMPAZINE    promethazine -dextromethorphan  6.25-15 MG/5ML syrup Commonly known as:  PROMETHAZINE -DM       TAKE these medications    acetaminophen  500 MG tablet Commonly known as: TYLENOL  Take 1,000 mg by mouth every 6 (six) hours as needed for headache, fever, moderate pain (pain score 4-6) or mild pain (pain score 1-3).   acyclovir  400 MG tablet Commonly known as: ZOVIRAX  Take 400 mg by mouth 2 (two) times daily.   albuterol  108 (90 Base) MCG/ACT inhaler Commonly known as: VENTOLIN  HFA Inhale 2 puffs into the lungs every 6 (six) hours as needed for wheezing or shortness of breath.   azelastine  0.1 % nasal spray Commonly known as: ASTELIN  Place 1 spray into both nostrils 2 (two) times daily. Use in each nostril as directed   benzonatate  100 MG capsule Commonly known as: TESSALON  Take 1 capsule (100 mg total) by mouth 2 (two) times daily as needed for cough.   dapsone  100 MG tablet Take 100 mg by mouth daily.   ondansetron  8 MG disintegrating tablet Commonly known as: ZOFRAN -ODT Take 1 tablet (8 mg total) by mouth every 8 (eight) hours as needed for nausea or vomiting.        Follow-up Information     [facility] Follow up in 1 week(s).   Contact information: [address]  [address] [place] [address] [phone]                Allergies[1]  The results of significant diagnostics from this hospitalization (including imaging, microbiology, ancillary and laboratory) are listed below for reference.    Microbiology: No results found for this or any previous visit (from the past 240 hours).  Procedures/Studies: MR BRAIN W CONTRAST Result Date: 04/01/2024 EXAM: MRI BRAIN WITH CONTRAST 04/01/2024 09:25:27 PM TECHNIQUE: Multiplanar multisequence MRI of the head/brain was performed with the administration of 6.5 mL of gadobutrol  (GADAVIST ) 1 MMOL/ML intravenous contrast. The study is performed as an adjunct to the brain MRI without contrast performed on 03/31/2024. COMPARISON: Brain MRI without contrast performed on  03/31/2024. CLINICAL HISTORY: hx of lymphoma. Abnormal MRI brain. History of lymphoma. Abnormal MRI brain. FINDINGS: BRAIN AND VENTRICLES: No acute infarct. No acute intracranial hemorrhage. No hydrocephalus. The sella is unremarkable. Normal flow voids. Postcontrast imaging shows widespread abnormal parenchymal contrast enhancement within both cerebral hemispheres affecting all lobes of the brain. Most of the lesions are subcortical, though the largest lesion is located in the right frontal white matter. ORBITS: No significant abnormality. SINUSES: No significant abnormality. BONES AND SOFT TISSUES: Normal bone marrow signal. No soft tissue abnormality. IMPRESSION: 1. Widespread abnormal parenchymal contrast enhancement throughout both cerebral hemispheres involving all lobes, most consistent with central nervous system lymphoma. Electronically signed by: Franky Stanford MD 04/01/2024 10:03 PM EST RP Workstation: [id number]   MR BRAIN WO CONTRAST Result Date: 03/31/2024 EXAM: MRI BRAIN WITHOUT CONTRAST 03/31/2024 04:03:40 PM TECHNIQUE: Multiplanar multisequence MRI of the head/brain was performed without the administration of intravenous contrast. COMPARISON: 08/06/2023 and 06/09/2023. CLINICAL HISTORY: Encephalopathy (Pediatric 0-17 years). FINDINGS: BRAIN  AND VENTRICLES: There are significantly increased areas of T2 and FLAIR signal abnormality. Acute/subacute infarcts are present as described by areas of restricted diffusion. Encephalomalacia and gliosis in the left frontal lobe are suggestive of remote infarct with mild associated susceptibility. Specifically, there is a rounded confluent focus of signal abnormality in the right frontal lobe involving the periventricular white matter which measures approximately 2.4 x 2.2 cm which demonstrates mild restricted diffusion. There is associated edema in this region. Additional signal abnormality and edema are present in the bilateral occipital lobes with mild gyral  expansion. There is an additional more confluent focus of signal abnormality and restricted diffusion in the left occipital lobe which measures up to 1.0 cm. Additional signal abnormality and edema are noted in the lateral left temporal lobe as well as within the anterior and inferior right temporal lobe. There is additional signal abnormality in the bilateral basal ganglia and thalami. There are no significant areas of susceptibility within the regions of parenchymal signal abnormality. There is mild mass effect on the anterior body and frontal horn of the right lateral ventricle. No significant midline shift. No intracranial hemorrhage. No mass. No hydrocephalus. The sella is unremarkable. Normal flow voids. ORBITS: Bilateral lens replacement. SINUSES AND MASTOIDS: Mucosal thickening throughout the ethmoid sinuses. Bilateral mastoid effusions. BONES AND SOFT TISSUES: Normal marrow signal. No soft tissue abnormality. IMPRESSION: 1. Significantly increased multifocal T2/FLAIR signal abnormality with associated edema and areas of restricted diffusion involving the right frontal periventricular white matter, bilateral occipital lobes, bilateral temporal lobes, and bilateral basal ganglia/thalami. Findings are most concerning for new/recurrent lymphoma versus metastatic disease. Recommend MRI with contrast for further evaluation and consider CSF sampling. Electronically signed by: Donnice Mania MD 03/31/2024 04:35 PM EST RP Workstation: [id number]   DG Chest Portable 1 View Result Date: 03/31/2024 EXAM: 1 VIEW(S) XRAY OF THE CHEST 03/31/2024 11:14:00 AM COMPARISON: 02/27/2024 CLINICAL HISTORY: Falls and weakness. FINDINGS: LINES, TUBES AND DEVICES: Right chest Port-A-Cath stable in place. LUNGS AND PLEURA: Improved right upper lobe airspace opacity. No pleural effusion. No pneumothorax. HEART AND MEDIASTINUM: No acute abnormality of the cardiac and mediastinal silhouettes. BONES AND SOFT TISSUES: No acute osseous  abnormality. IMPRESSION: 1. Improved right upper lobe airspace opacity. 2. Right chest Port-A-Cath stable in position. Electronically signed by: Evalene Coho MD 03/31/2024 11:27 AM EST RP Workstation: [id number]   CT Angio Chest W/Cm &/Or Wo Cm Result Date: 03/06/2024 CLINICAL DATA:  Hypoxemia. Sick for 2 weeks. Diagnosed with pneumonia last week. Active lymphoma. Evaluate for pulmonary embolism. EXAM: CT ANGIOGRAPHY CHEST WITH CONTRAST TECHNIQUE: Multidetector CT imaging of the chest was performed using the standard protocol during bolus administration of intravenous contrast. Multiplanar CT image reconstructions and MIPs were obtained to evaluate the vascular anatomy. RADIATION DOSE REDUCTION: This exam was performed according to the departmental dose-optimization program which includes automated exposure control, adjustment of the mA and/or kV according to patient size and/or use of iterative reconstruction technique. CONTRAST:  75mL OMNIPAQUE  IOHEXOL  350 MG/ML SOLN COMPARISON:  CT 02/21/2022, chest x-ray 02/22/2024 and 02/27/2024 FINDINGS: Cardiovascular: Right IJ Port-A-Cath has tip over the cavoatrial junction. Heart is normal size. Mild calcified plaque over the left anterior descending coronary artery. Thoracic aorta is normal in caliber. Pulmonary arterial system is well opacified without evidence of emboli. Remaining vascular structures are unremarkable. Mediastinum/Nodes: No mediastinal or hilar adenopathy. Remaining mediastinal structures are unremarkable. Lungs/Pleura: Lungs are adequately inflated. There is moderate consolidation over the inferior aspect of the right upper lobe abutting the minor fissure.  Subtle patchy airspace density over the left upper lobe and minimally left lower lobe. These findings likely due to multifocal infection. No effusion. Airways are unremarkable. Upper Abdomen: Images through the upper abdomen demonstrate calcified plaque over the visualized abdominal aorta. No  acute findings. Musculoskeletal: No focal abnormality. Review of the MIP images confirms the above findings. IMPRESSION: 1. No evidence of pulmonary embolism. 2. Persistent consolidation over the inferior aspect of the right upper lobe. Subtle patchy airspace density over the left upper lobe and minimally left lower lobe. Findings likely due to multifocal pneumonia. Recommend follow-up chest CT 4-6 weeks to document resolution. 3. Aortic atherosclerosis. Atherosclerotic coronary artery disease. Aortic Atherosclerosis (ICD10-I70.0). Electronically Signed   By: Toribio Agreste M.D.   On: 03/06/2024 13:57    Labs: BNP (last 3 results) No results for input(s): BNP in the last 8760 hours. Basic Metabolic Panel: Recent Labs  Lab 03/31/24 1154 03/31/24 1703 04/01/24 0227 04/02/24 0821  NA 145  --  136 140  K 2.1*  --  4.2 4.1  CL 120*  --  101 102  CO2 15*  --  24 28  GLUCOSE 55*  --  84 108*  BUN 8  --  10 15  CREATININE <0.30*  --  0.60 0.58  CALCIUM  5.2* 10.7* 9.2 9.6  MG  --  2.0  --   --    Liver Function Tests: Recent Labs  Lab 03/31/24 1154  AST 22  ALT 20  ALKPHOS 59  BILITOT 0.3  PROT 3.4*  ALBUMIN 2.5*   No results for input(s): LIPASE, AMYLASE in the last 168 hours. No results for input(s): AMMONIA in the last 168 hours. CBC: Recent Labs  Lab 03/31/24 1154 04/01/24 0227 04/03/24 0821  WBC 3.0* 2.3* 2.2*  NEUTROABS 1.4* 0.7* PENDING  HGB 12.4 11.6* 12.3  HCT 37.0 35.8* 36.9  MCV 93.7 95.2 95.8  PLT 152 134* 130*   CBG: Recent Labs  Lab 03/31/24 1323  GLUCAP 84   Hgb A1c No results for input(s): HGBA1C in the last 72 hours. Anemia work up No results for input(s): VITAMINB12, FOLATE, FERRITIN, TIBC, IRON, RETICCTPCT in the last 72 hours. Cardiac Enzymes: Recent Labs  Lab 03/31/24 1154  CKTOTAL 86   BNP: Invalid input(s): POCBNP D-Dimer No results for input(s): DDIMER in the last 72 hours. Lipid Profile No results for  input(s): CHOL, HDL, LDLCALC, TRIG, CHOLHDL, LDLDIRECT in the last 72 hours. Thyroid  function studies Recent Labs    03/31/24 1703  TSH 0.889   Urinalysis    Component Value Date/Time   COLORURINE YELLOW 03/31/2024 0037   APPEARANCEUR HAZY (A) 03/31/2024 0037   LABSPEC 1.021 03/31/2024 0037   PHURINE 5.0 03/31/2024 0037   GLUCOSEU NEGATIVE 03/31/2024 0037   GLUCOSEU NEGATIVE 11/30/2020 1218   HGBUR SMALL (A) 03/31/2024 0037   BILIRUBINUR NEGATIVE 03/31/2024 0037   BILIRUBINUR neg 06/15/2020 1333   KETONESUR 80 (A) 03/31/2024 0037   PROTEINUR 30 (A) 03/31/2024 0037   UROBILINOGEN 0.2 11/30/2020 1218   NITRITE NEGATIVE 03/31/2024 0037   LEUKOCYTESUR LARGE (A) 03/31/2024 0037   Sepsis Labs Recent Labs  Lab 03/31/24 1154 04/01/24 0227 04/03/24 0821  WBC 3.0* 2.3* 2.2*   Microbiology No results found for this or any previous visit (from the past 240 hours).   Time coordinating discharge: 25 minutes  SIGNED: Mennie LAMY, MD  [facility] 04/03/2024, 10:07 AM  If 7PM-7AM, please contact night-coverage [email or web address]       [1]  Allergies Allergen Reactions   Decadron  [Dexamethasone ] Other (See Comments)    Leg weakness Hallucinations   Plavix  [Clopidogrel ] Hives, Swelling and Other (See Comments)    Lips became swollen   Prednisone Other (See Comments)    Steroids caused psychological issues  Hallucinations   Sulfa  Antibiotics Itching and Rash   Codeine Nausea And Vomiting

## 2024-04-03 NOTE — Plan of Care (Signed)
  Problem: Education: Goal: Knowledge of General Education information will improve Description: Including pain rating scale, medication(s)/side effects and non-pharmacologic comfort measures Outcome: Progressing   Problem: Health Behavior/Discharge Planning: Goal: Ability to manage health-related needs will improve Outcome: Progressing   Problem: Clinical Measurements: Goal: Ability to maintain clinical measurements within normal limits will improve Outcome: Progressing Goal: Will remain free from infection Outcome: Progressing Goal: Diagnostic test results will improve Outcome: Progressing Goal: Respiratory complications will improve Outcome: Progressing Goal: Cardiovascular complication will be avoided Outcome: Progressing   Problem: Coping: Goal: Level of anxiety will decrease Outcome: Progressing   Problem: Elimination: Goal: Will not experience complications related to bowel motility Outcome: Progressing Goal: Will not experience complications related to urinary retention Outcome: Progressing   Problem: Pain Managment: Goal: General experience of comfort will improve and/or be controlled Outcome: Progressing   Problem: Safety: Goal: Ability to remain free from injury will improve Outcome: Progressing

## 2024-04-17 ENCOUNTER — Ambulatory Visit (HOSPITAL_BASED_OUTPATIENT_CLINIC_OR_DEPARTMENT_OTHER)

## 2025-01-15 ENCOUNTER — Ambulatory Visit

## 2025-02-17 ENCOUNTER — Encounter: Admitting: Family
